# Patient Record
Sex: Female | Born: 1955 | Race: White | Hispanic: No | Marital: Married | Smoking: Former smoker
Health system: Southern US, Community
[De-identification: ages and names within clinical notes are randomized; demographics above are authoritative.]

## PROBLEM LIST (undated history)

## (undated) DIAGNOSIS — N184 Chronic kidney disease, stage 4 (severe): Secondary | ICD-10-CM

## (undated) DIAGNOSIS — I10 Essential (primary) hypertension: Secondary | ICD-10-CM

## (undated) DIAGNOSIS — I5022 Chronic systolic (congestive) heart failure: Secondary | ICD-10-CM

## (undated) DIAGNOSIS — I4892 Unspecified atrial flutter: Secondary | ICD-10-CM

## (undated) DIAGNOSIS — I480 Paroxysmal atrial fibrillation: Secondary | ICD-10-CM

## (undated) DIAGNOSIS — E538 Deficiency of other specified B group vitamins: Secondary | ICD-10-CM

## (undated) DIAGNOSIS — I340 Nonrheumatic mitral (valve) insufficiency: Secondary | ICD-10-CM

## (undated) DIAGNOSIS — I509 Heart failure, unspecified: Secondary | ICD-10-CM

## (undated) DIAGNOSIS — N809 Endometriosis, unspecified: Secondary | ICD-10-CM

## (undated) DIAGNOSIS — J449 Chronic obstructive pulmonary disease, unspecified: Secondary | ICD-10-CM

## (undated) DIAGNOSIS — F32A Depression, unspecified: Secondary | ICD-10-CM

## (undated) DIAGNOSIS — D649 Anemia, unspecified: Secondary | ICD-10-CM

## (undated) DIAGNOSIS — I429 Cardiomyopathy, unspecified: Secondary | ICD-10-CM

## (undated) DIAGNOSIS — Z720 Tobacco use: Secondary | ICD-10-CM

## (undated) DIAGNOSIS — E782 Mixed hyperlipidemia: Secondary | ICD-10-CM

## (undated) DIAGNOSIS — N183 Chronic kidney disease, stage 3 unspecified: Secondary | ICD-10-CM

## (undated) DIAGNOSIS — F329 Major depressive disorder, single episode, unspecified: Secondary | ICD-10-CM

## (undated) HISTORY — DX: Essential (primary) hypertension: I10

## (undated) HISTORY — PX: ABDOMINAL HYSTERECTOMY: SHX81

## (undated) HISTORY — DX: Depression, unspecified: F32.A

## (undated) HISTORY — DX: Endometriosis, unspecified: N80.9

## (undated) HISTORY — DX: Heart failure, unspecified: I50.9

## (undated) HISTORY — DX: Major depressive disorder, single episode, unspecified: F32.9

---

## 1997-08-26 HISTORY — PX: OTHER SURGICAL HISTORY: SHX169

## 2005-07-23 ENCOUNTER — Ambulatory Visit: Payer: Self-pay | Admitting: Internal Medicine

## 2006-03-10 ENCOUNTER — Ambulatory Visit: Payer: Self-pay | Admitting: Surgery

## 2006-04-02 ENCOUNTER — Ambulatory Visit: Payer: Self-pay | Admitting: Surgery

## 2006-05-21 ENCOUNTER — Inpatient Hospital Stay: Payer: Self-pay | Admitting: Orthopedic Surgery

## 2006-05-21 ENCOUNTER — Other Ambulatory Visit: Payer: Self-pay

## 2007-11-23 ENCOUNTER — Ambulatory Visit: Payer: Self-pay | Admitting: Internal Medicine

## 2008-11-30 ENCOUNTER — Ambulatory Visit: Payer: Self-pay | Admitting: Internal Medicine

## 2010-11-09 ENCOUNTER — Ambulatory Visit: Payer: Self-pay | Admitting: Internal Medicine

## 2012-06-05 ENCOUNTER — Other Ambulatory Visit: Payer: Self-pay | Admitting: *Deleted

## 2012-06-05 MED ORDER — AMLODIPINE BESYLATE 10 MG PO TABS: 10.0000 mg | ORAL_TABLET | Freq: Every day | ORAL | Status: DC

## 2012-06-05 NOTE — Telephone Encounter (Signed)
Rx faxed to [facility].

## 2012-07-02 ENCOUNTER — Encounter: Payer: Self-pay | Admitting: *Deleted

## 2012-07-03 ENCOUNTER — Encounter: Payer: Self-pay | Admitting: Internal Medicine

## 2012-07-03 ENCOUNTER — Ambulatory Visit (INDEPENDENT_AMBULATORY_CARE_PROVIDER_SITE_OTHER): Payer: BC Managed Care – PPO | Admitting: Internal Medicine

## 2012-07-03 ENCOUNTER — Encounter: Payer: Self-pay | Admitting: *Deleted

## 2012-07-03 VITALS — BP 144/78 | HR 105 | Temp 98.4°F | Ht 65.0 in | Wt 167.0 lb

## 2012-07-03 DIAGNOSIS — F329 Major depressive disorder, single episode, unspecified: Secondary | ICD-10-CM

## 2012-07-03 DIAGNOSIS — R5381 Other malaise: Secondary | ICD-10-CM

## 2012-07-03 DIAGNOSIS — Z139 Encounter for screening, unspecified: Secondary | ICD-10-CM

## 2012-07-03 DIAGNOSIS — R5383 Other fatigue: Secondary | ICD-10-CM

## 2012-07-03 DIAGNOSIS — I10 Essential (primary) hypertension: Secondary | ICD-10-CM

## 2012-07-03 MED ORDER — HYDROCHLOROTHIAZIDE 25 MG PO TABS: 25.0000 mg | ORAL_TABLET | Freq: Every day | ORAL | Status: DC

## 2012-07-03 MED ORDER — VENLAFAXINE HCL ER 150 MG PO CP24: 150.0000 mg | ORAL_CAPSULE | Freq: Every day | ORAL | Status: DC

## 2012-07-03 NOTE — Patient Instructions (Signed)
It was nice seeing you today.  I am glad you are doing well.  We will get you follow up labs and a physical scheduled.

## 2012-07-05 DIAGNOSIS — F329 Major depressive disorder, single episode, unspecified: Secondary | ICD-10-CM | POA: Insufficient documentation

## 2012-07-05 DIAGNOSIS — F320 Major depressive disorder, single episode, mild: Secondary | ICD-10-CM | POA: Insufficient documentation

## 2012-07-05 DIAGNOSIS — I10 Essential (primary) hypertension: Secondary | ICD-10-CM | POA: Insufficient documentation

## 2012-07-05 NOTE — Progress Notes (Signed)
  Subjective:    Patient ID: Dawn Sherman, female    DOB: [birth year], 56 y.o.   MRN: [id number]  HPI 56 year old female with past history of hypertension and depression who comes in today for a scheduled follow up.  States she is doing relatively well.  Feels she is handling stress well.  Her sister-n-law was recently diagnosed with stage 4 brain cancer.  Good support.  Has not been taking the HCTZ.  Ran out and never got it refilled.  States her blood pressure is averaging 130s/80.  No chest pain or tightness with increased activity or exertion.  Eating and drinking well.    Past Medical History  Diagnosis Date  . Depression   . Hypertension   . Endometriosis     Review of Systems Patient denies any headache, lightheadedness or dizziness.  No significant sinus or allergy symptoms.  No chest pain, tightness or palpitations.  No increased shortness of breath, cough or congestion.  No nausea or vomiting.  No acid reflux.  No abdominal pain or cramping.  No bowel change, such as diarrhea, constipation, BRBPR or melana.  No urine change.        Objective:   Physical Exam Filed Vitals:   07/03/12 0924  BP: 144/78  Pulse: 105  Temp: 98.4 F (36.9 C)   Blood pressure recheck:  57/49  56 year old female in no acute distress.   HEENT:  Nares - clear.  OP- without lesions or erythema.  NECK:  Supple, nontender.  No audible bruit.   HEART:  Appears to be regular. LUNGS:  Without crackles or wheezing audible.  Respirations even and unlabored.   RADIAL PULSE:  Equal bilaterally.  ABDOMEN:  Soft, nontender.  No audible abdominal bruit.   EXTREMITIES:  No increased edema to be present.                     Assessment & Plan:  FATIGUE.  Probably multifactorial.  Check cbc, met c and tsh to confirm no metabolic etiology.   HEALTH MAINTENANCE.  Scheduled a physical - next visit.  Obtain outside records for review - to keep up to date.  She thinks her mammogram was 3/13.  She declines flu shot.

## 2012-07-05 NOTE — Assessment & Plan Note (Signed)
Stable on current regimen.  Same meds.  Follow.

## 2012-07-05 NOTE — Assessment & Plan Note (Signed)
Blood pressure as outlined.  Restart the HCTZ.  Check metabolic panel.  Follow pressures.  If persistent elevation, will require further adjustment.

## 2012-08-30 ENCOUNTER — Telehealth: Payer: Self-pay | Admitting: Internal Medicine

## 2012-08-30 MED ORDER — AMLODIPINE BESYLATE 10 MG PO TABS: 10.0000 mg | ORAL_TABLET | Freq: Every day | ORAL | Status: DC

## 2012-08-30 NOTE — Telephone Encounter (Signed)
rx written for amlodipine 10mg  #30 with 5 refills - sent to [place]

## 2012-09-15 ENCOUNTER — Telehealth: Payer: Self-pay | Admitting: Internal Medicine

## 2012-10-05 ENCOUNTER — Other Ambulatory Visit: Payer: BC Managed Care – PPO

## 2012-10-09 ENCOUNTER — Other Ambulatory Visit: Payer: BC Managed Care – PPO

## 2012-10-12 ENCOUNTER — Encounter: Payer: Self-pay | Admitting: Internal Medicine

## 2012-10-12 ENCOUNTER — Other Ambulatory Visit (HOSPITAL_COMMUNITY)
Admission: RE | Admit: 2012-10-12 | Discharge: 2012-10-12 | Disposition: A | Discharge status: Home / Self Care | Payer: BC Managed Care – PPO | Source: Ambulatory Visit | Attending: Internal Medicine | Admitting: Internal Medicine

## 2012-10-12 ENCOUNTER — Ambulatory Visit (INDEPENDENT_AMBULATORY_CARE_PROVIDER_SITE_OTHER): Payer: BC Managed Care – PPO | Admitting: Internal Medicine

## 2012-10-12 VITALS — BP 140/88 | HR 114 | Temp 98.6°F | Ht 65.0 in | Wt 160.5 lb

## 2012-10-12 DIAGNOSIS — Z1239 Encounter for other screening for malignant neoplasm of breast: Secondary | ICD-10-CM

## 2012-10-12 DIAGNOSIS — Z1151 Encounter for screening for human papillomavirus (HPV): Secondary | ICD-10-CM | POA: Insufficient documentation

## 2012-10-12 DIAGNOSIS — I10 Essential (primary) hypertension: Secondary | ICD-10-CM

## 2012-10-12 DIAGNOSIS — N6489 Other specified disorders of breast: Secondary | ICD-10-CM

## 2012-10-12 DIAGNOSIS — N760 Acute vaginitis: Secondary | ICD-10-CM

## 2012-10-12 DIAGNOSIS — Z01419 Encounter for gynecological examination (general) (routine) without abnormal findings: Secondary | ICD-10-CM | POA: Insufficient documentation

## 2012-10-12 DIAGNOSIS — Z1211 Encounter for screening for malignant neoplasm of colon: Secondary | ICD-10-CM

## 2012-10-12 DIAGNOSIS — F329 Major depressive disorder, single episode, unspecified: Secondary | ICD-10-CM

## 2012-10-13 ENCOUNTER — Encounter: Payer: Self-pay | Admitting: Internal Medicine

## 2012-10-13 LAB — WET PREP BY MOLECULAR PROBE: Gardnerella vaginalis: NEGATIVE

## 2012-10-13 NOTE — Assessment & Plan Note (Signed)
Blood pressure improved on recheck.  Her readings are wnl.  Check metabolic panel.  Same medication regimen.  Follow.

## 2012-10-13 NOTE — Assessment & Plan Note (Signed)
Doing well on her current regimen.  Same medications.  Follow.

## 2012-10-13 NOTE — Progress Notes (Signed)
Subjective:    Patient ID: Dawn Sherman, female    DOB: 03/29/1956, 57 y.o.   MRN: [id number]  HPI 57 year old female with past history of hypertension and depression who comes in today to follow up on these issues as well as for a complete physical exam.  States she is doing relatively well.  Feels she is handling stress well.  She just had two teet pulled - abscess.  Has been on abx.  Now has some vaginal irritation and discharge.  States she fell within the last couple of weeks.  Tripped over a blanket.  Bruised her face and breasts.  Has no residual side effects.  No bruising now.  No pain.  States breathing is stable.  No chest pain or tightness.  Bowels stable.  No blood.  Reports her blood pressure has been doing well on outside checks.  BP averages 120/70s (highest 136/84).    Past Medical History  Diagnosis Date  . Depression   . Hypertension   . Endometriosis     Current Outpatient Prescriptions on File Prior to Visit  Medication Sig Dispense Refill  . amLODipine (NORVASC) 10 MG tablet Take 1 tablet (10 mg total) by mouth daily.  30 tablet  5  . buPROPion (WELLBUTRIN XL) 150 MG 24 hr tablet Take 150 mg by mouth daily.      Marland Kitchen estrogens, conjugated, (PREMARIN) 0.625 MG tablet Take 0.625 mg by mouth daily.       . hydrochlorothiazide (HYDRODIURIL) 25 MG tablet Take 1 tablet (25 mg total) by mouth daily.  30 tablet  5  . venlafaxine XR (EFFEXOR-XR) 150 MG 24 hr capsule Take 1 capsule (150 mg total) by mouth daily.  30 capsule  6   No current facility-administered medications on file prior to visit.    Review of Systems Patient denies any headache, lightheadedness or dizziness.  No sinus or allergy sympotms.   No chest pain, tightness or palpitations.  No increased shortness of breath, cough or congestion.  No nausea or vomiting.  No acid reflux.  No abdominal pain or cramping.  No bowel change, such as diarrhea, constipation, BRBPR or melana.  No urine change.  Does report the  vaginal symptoms as outlined.  Some fatigue.      Objective:   Physical Exam  Filed Vitals:   10/12/12 1605  BP: 140/88  Pulse: 114  Temp: 98.6 F (37 C)   Blood pressure recheck:  31/80  57 year old female in no acute distress.   HEENT:  Nares- clear.  Oropharynx - without lesions. NECK:  Supple.  Nontender.  No audible bruit.  HEART:  Appears to be regular. LUNGS:  No crackles or wheezing audible.  Respirations even and unlabored.  Good breath sounds bilaterally.  RADIAL PULSE:  Equal bilaterally.    BREASTS:  No nipple discharge or nipple retraction present.  Could not appreciate any distinct axillary adenopathy.  She did have some fullness in the left breast - 10-11o'clock region.  Non tender.  No bruising.   ABDOMEN:  Soft, nontender.  Bowel sounds present and normal.  No audible abdominal bruit.  GU:  Normal external genitalia.  Vaginal vault without lesions.  S/P hysterectomy.  Pap performed. Could not appreciate any adnexal masses or tenderness.  Some discharge present.  KOH/wet prep obtained.  RECTAL:  Heme negative.   EXTREMITIES:  No increased edema present.  DP pulses palpable and equal bilaterally.  Assessment & Plan:  FATIGUE.  Probably multifactorial.  Check cbc, met c and tsh to confirm no metabolic etiology.  Never had labs drawn from last visit.    BREAST FULLNESS.  See above exam.  Schedule a diagnostic mammogram.    HEALTH MAINTENANCE.  Physical today.  She thinks her mammogram was 3/13.  Schedule a follow up mammogram as outlined.  She declines flu shot.  Declines colonoscopy.  IFOB given.

## 2012-10-14 ENCOUNTER — Telehealth: Payer: Self-pay | Admitting: Internal Medicine

## 2012-10-14 ENCOUNTER — Other Ambulatory Visit (INDEPENDENT_AMBULATORY_CARE_PROVIDER_SITE_OTHER): Payer: BC Managed Care – PPO

## 2012-10-14 DIAGNOSIS — Z139 Encounter for screening, unspecified: Secondary | ICD-10-CM

## 2012-10-14 DIAGNOSIS — R5381 Other malaise: Secondary | ICD-10-CM

## 2012-10-14 DIAGNOSIS — R5383 Other fatigue: Secondary | ICD-10-CM

## 2012-10-14 LAB — COMPREHENSIVE METABOLIC PANEL
ALT: 22 U/L (ref 0–35)
BUN: 19 mg/dL (ref 6–23)
CO2: 25 mEq/L (ref 19–32)
Creatinine, Ser: 1 mg/dL (ref 0.4–1.2)
GFR: 58.63 mL/min — ABNORMAL LOW (ref >60.00)
Glucose, Bld: 100 mg/dL — ABNORMAL HIGH (ref 70–99)
Total Bilirubin: 0.7 mg/dL (ref 0.3–1.2)

## 2012-10-14 LAB — TSH: TSH: 4.86 u[IU]/mL (ref 0.35–5.50)

## 2012-10-14 LAB — LIPID PANEL
Cholesterol: 182 mg/dL (ref 0–200)
HDL: 94.4 mg/dL (ref >39.00)
Triglycerides: 273 mg/dL — ABNORMAL HIGH (ref 0.0–149.0)

## 2012-10-14 LAB — CBC WITH DIFFERENTIAL/PLATELET
Basophils Absolute: 0.1 10*3/uL (ref 0.0–0.1)
Basophils Relative: 1.4 % (ref 0.0–3.0)
HCT: 39.2 % (ref 36.0–46.0)
Hemoglobin: 13.1 g/dL (ref 12.0–15.0)
Lymphs Abs: 1.7 10*3/uL (ref 0.7–4.0)
Monocytes Relative: 9.8 % (ref 3.0–12.0)
Neutro Abs: 3.9 10*3/uL (ref 1.4–7.7)
RDW: 15.6 % — ABNORMAL HIGH (ref 11.5–14.6)

## 2012-10-14 NOTE — Telephone Encounter (Signed)
Pt was in for labs today and wanted to let you know that she is allergic to clindamycin hcl 300 mg capsule  This causing her break out in a rash

## 2012-10-15 NOTE — Telephone Encounter (Signed)
I added clindamycin to her allergy list.

## 2012-10-16 ENCOUNTER — Encounter: Payer: Self-pay | Admitting: Internal Medicine

## 2012-10-20 ENCOUNTER — Other Ambulatory Visit: Payer: Self-pay | Admitting: *Deleted

## 2012-10-20 ENCOUNTER — Ambulatory Visit: Payer: Self-pay | Admitting: Internal Medicine

## 2012-10-20 MED ORDER — ESTROGENS CONJUGATED 0.625 MG PO TABS: 0.6250 mg | ORAL_TABLET | Freq: Every day | ORAL | Status: DC

## 2012-10-21 ENCOUNTER — Telehealth: Payer: Self-pay | Admitting: Internal Medicine

## 2012-10-21 DIAGNOSIS — R928 Other abnormal and inconclusive findings on diagnostic imaging of breast: Secondary | ICD-10-CM

## 2012-10-21 NOTE — Telephone Encounter (Signed)
Pt notified of category 4 mammo.  Order placed for referral to Dr Bary Castilla

## 2012-10-23 ENCOUNTER — Encounter: Payer: Self-pay | Admitting: *Deleted

## 2012-11-03 ENCOUNTER — Encounter: Payer: Self-pay | Admitting: *Deleted

## 2012-11-05 ENCOUNTER — Encounter: Payer: Self-pay | Admitting: Internal Medicine

## 2012-11-06 ENCOUNTER — Encounter: Payer: Self-pay | Admitting: *Deleted

## 2012-11-09 ENCOUNTER — Telehealth: Payer: Self-pay | Admitting: *Deleted

## 2012-11-09 NOTE — Telephone Encounter (Signed)
Pt is coming in for labs tomorrow 03.18.2014, is it just for an IFOB? Thank yo u

## 2012-11-10 ENCOUNTER — Other Ambulatory Visit: Payer: BC Managed Care – PPO

## 2012-11-10 ENCOUNTER — Telehealth: Payer: Self-pay | Admitting: *Deleted

## 2012-11-10 NOTE — Telephone Encounter (Signed)
Pt is coming in tomorrow 03.19.2014 for labs. Is it just for a urine sample? Thank you

## 2012-11-10 NOTE — Telephone Encounter (Signed)
Wrong patient

## 2012-11-10 NOTE — Telephone Encounter (Signed)
She does need to IFOB if she does not have.  Also can repeat alkaline phosphatase (dx elevated alkaline phosphatase).  Thanks

## 2012-11-12 ENCOUNTER — Ambulatory Visit: Payer: Self-pay | Admitting: General Surgery

## 2012-11-24 ENCOUNTER — Other Ambulatory Visit: Payer: BC Managed Care – PPO

## 2012-11-26 ENCOUNTER — Ambulatory Visit: Payer: Self-pay | Admitting: General Surgery

## 2012-12-14 ENCOUNTER — Ambulatory Visit (INDEPENDENT_AMBULATORY_CARE_PROVIDER_SITE_OTHER): Payer: BC Managed Care – PPO | Admitting: General Surgery

## 2012-12-14 ENCOUNTER — Other Ambulatory Visit: Payer: Self-pay

## 2012-12-14 ENCOUNTER — Encounter: Payer: Self-pay | Admitting: General Surgery

## 2012-12-14 ENCOUNTER — Other Ambulatory Visit: Payer: Self-pay | Admitting: *Deleted

## 2012-12-14 ENCOUNTER — Encounter: Payer: Self-pay | Admitting: *Deleted

## 2012-12-14 VITALS — BP 142/80 | HR 96 | Resp 16 | Ht 65.0 in | Wt 159.0 lb

## 2012-12-14 DIAGNOSIS — N6489 Other specified disorders of breast: Secondary | ICD-10-CM

## 2012-12-14 DIAGNOSIS — N63 Unspecified lump in unspecified breast: Secondary | ICD-10-CM

## 2012-12-14 NOTE — Progress Notes (Signed)
Patient ID: [name], female   DOB: [birth year], 57 y.o.   MRN: [id number]  Chief Complaint  Patient presents with  . New Breast Patient    category 4 mammogram    HPI Dawn Sherman is a 57 y.o. female who presents for an abnormal mammogram. The most recent mammogram was done 10/20/12 at [facility] with birad category 4. Left breast ultrasound was performed at that time. The patient states she had a fall approximately 5 months ago which she had bruised both breasts at that time. She says that she had swelling in both breasts and was not able to wear a bra for awhile. She hasn't nipple discharge or lumps. Prior to her fall she was not having any problems with her breasts. She has had a breast biopsy of the right breast at [facility] approximately 20 years ago which was benign. No known family history of breast cancer. She does perform regular self breast checks as well as gets regular mammograms done.  HPI  Past Medical History  Diagnosis Date  . Depression   . Hypertension   . Endometriosis     Past Surgical History  Procedure Laterality Date  . Tah/rso  1999    secondary to bleeding and endometriosis (Dr Vernie Ammons)  . Abdominal hysterectomy      Family History  Problem Relation Age of Onset  . Hypercholesterolemia Mother   . Breast cancer Neg Hx   . Colon cancer Neg Hx     Social History History  Substance Use Topics  . Smoking status: Current Every Day Smoker -- 1.00 packs/day for 20 years    Types: Cigarettes  . Smokeless tobacco: Never Used  . Alcohol Use: Yes     Comment: occasional    Allergies  Allergen Reactions  . Amoxicillin   . Benicar (Olmesartan)   . Codeine Sulfate   . Morphine And Related   . Penicillins   . Sulfate   . Clindamycin/Lincomycin Rash    Current Outpatient Prescriptions  Medication Sig Dispense Refill  . amLODipine (NORVASC) 10 MG tablet Take 1 tablet (10 mg total) by mouth daily.  30 tablet  5  . buPROPion (WELLBUTRIN XL) 150 MG 24 hr tablet Take  150 mg by mouth daily.      . cetirizine (ZYRTEC) 10 MG tablet Take 10 mg by mouth daily.      Marland Kitchen estrogens, conjugated, (PREMARIN) 0.625 MG tablet Take 1 tablet (0.625 mg total) by mouth daily.  30 tablet  5  . hydrochlorothiazide (HYDRODIURIL) 25 MG tablet Take 1 tablet (25 mg total) by mouth daily.  30 tablet  5  . venlafaxine XR (EFFEXOR-XR) 150 MG 24 hr capsule Take 1 capsule (150 mg total) by mouth daily.  30 capsule  6   No current facility-administered medications for this visit.    Review of Systems Review of Systems  Constitutional: Negative.   Respiratory: Negative.   Cardiovascular: Negative.     Blood pressure 142/80, pulse 96, resp. rate 16, height 5\' 5"  (1.651 m), weight 159 lb (72.122 kg).  Physical Exam Physical Exam  Constitutional: She appears well-developed and well-nourished.  Neck: Trachea normal. No mass and no thyromegaly present.  Cardiovascular: Normal rate, regular rhythm, normal heart sounds and normal pulses.   No murmur heard. Pulmonary/Chest: Effort normal and breath sounds normal. Right breast exhibits no inverted nipple, no mass, no nipple discharge, no skin change and no tenderness. Left breast exhibits tenderness. Left breast exhibits no inverted nipple, no mass, no  nipple discharge and no skin change. Breasts are symmetrical.  Fullness from 9-10 o'clock mild tenderness of left breast.   Lymphadenopathy:    She has no cervical adenopathy.    She has no axillary adenopathy.    Data Reviewed Bilateral mammogram dated October 20, 2012 showed extremely dense breast tissue. No areas of concern identified.  Focal spot compression views in the area of reported fullness in the superior medial aspect of the breast suggested a small round asymmetry similar to previous studies. A large area of architectural distortion density was seen in the superior aspect of the left breast of the 12th 1:00 position. Lucency suggestive of fat necrosis was  seen.  Ultrasound examination at the 10:00 position reported hypoechoic oval mass measuring 0.7 x 0.8 x 1.0 cm with posterior acoustic shadowing. At 10:30 o'clock a small hypoechoic mass measuring 6 mm in greatest diameter was noted. And in the 11 to 2:00 position multiple hypoechoic to anechoic lesions thought to represent a possible fluid collection were identified. At the 1:30 o'clock position 2 small relatively superficial hypoechoic masses measuring less than 0.5 cm diameter were identified. Axillary ultrasound was unremarkable. BI-RAD-4.  Ultrasound examination of the left breast was completed. The 10:00 position 5 cm from the nipple a 0.56 x 0.9 x 1.3 cm irregular hypoechoic mass with posterior acoustic shadowing matching that described in the hospital ultrasound was identified. The patient was amenable to vacuum-assisted biopsy. 10 cc of 0.5% Xylocaine with 0.25% Marcaine with one 200,000 units of epinephrine was utilized well-tolerated. Under ultrasound guidance the 14-gauge Anastomosis was passed through the lesion and multiple core samples obtained. These were taken through multiple levels of the mass. Postbiopsy clip placement was undertaken. The patient tolerated the procedure well. Scant bleeding was noted. The skin defect was closed with benzoin and Steri-Strips followed by Telfa and Tegaderm dressing. Assessment    Likely traumatic injury to left breast.     Plan    The patient will be contacted once pathology is available. She will return in one week for evaluation with the nurse. Physician follow up will take place based on results of the biopsy.        Robert Bellow 12/15/2012, 9:28 PM

## 2012-12-14 NOTE — Progress Notes (Signed)
The patient has been asked to return to the office in 4 months for a unilateral left breast diagnostic mammogram with [facility] views at [facility].

## 2012-12-15 ENCOUNTER — Telehealth: Payer: Self-pay | Admitting: General Surgery

## 2012-12-15 ENCOUNTER — Encounter: Payer: Self-pay | Admitting: General Surgery

## 2012-12-15 DIAGNOSIS — N63 Unspecified lump in unspecified breast: Secondary | ICD-10-CM | POA: Insufficient documentation

## 2012-12-15 LAB — PATHOLOGY

## 2012-12-15 NOTE — Telephone Encounter (Signed)
Patient notified that her pathology on the left breast mass was benign. She reports minimal discomfort from the procedure. She will follow up next week as scheduled with the nurse for a wound check.

## 2012-12-23 ENCOUNTER — Ambulatory Visit: Payer: BC Managed Care – PPO

## 2013-01-05 ENCOUNTER — Encounter: Payer: Self-pay | Admitting: Internal Medicine

## 2013-01-05 ENCOUNTER — Ambulatory Visit (INDEPENDENT_AMBULATORY_CARE_PROVIDER_SITE_OTHER): Payer: BC Managed Care – PPO | Admitting: *Deleted

## 2013-01-05 DIAGNOSIS — N63 Unspecified lump in unspecified breast: Secondary | ICD-10-CM

## 2013-01-05 NOTE — Progress Notes (Signed)
Patient here today for follow up post left breast biopsy.  No bruising noted.  Area clean and healed.  The patient is aware that a heating pad may be used for comfort as needed.  Aware of pathology. Follow up as scheduled.

## 2013-01-05 NOTE — Patient Instructions (Signed)
The patient is aware that a heating pad may be used for comfort as needed.  Aware of pathology. Follow up as scheduled in August

## 2013-01-20 ENCOUNTER — Telehealth: Payer: Self-pay | Admitting: *Deleted

## 2013-01-20 MED ORDER — BUPROPION HCL ER (XL) 150 MG PO TB24: 150.0000 mg | ORAL_TABLET | Freq: Every day | ORAL | Status: DC

## 2013-01-20 NOTE — Telephone Encounter (Signed)
Refilled wellbutrin #30 with 5 refills

## 2013-01-20 NOTE — Telephone Encounter (Signed)
Wellbutrin refill please advise. Last OV 10/21/12

## 2013-01-26 ENCOUNTER — Other Ambulatory Visit: Payer: Self-pay | Admitting: Internal Medicine

## 2013-02-11 ENCOUNTER — Ambulatory Visit: Payer: BC Managed Care – PPO | Admitting: Internal Medicine

## 2013-03-02 ENCOUNTER — Ambulatory Visit: Payer: BC Managed Care – PPO | Admitting: Internal Medicine

## 2013-03-08 ENCOUNTER — Ambulatory Visit (INDEPENDENT_AMBULATORY_CARE_PROVIDER_SITE_OTHER): Payer: BC Managed Care – PPO | Admitting: Internal Medicine

## 2013-03-08 ENCOUNTER — Encounter: Payer: Self-pay | Admitting: Internal Medicine

## 2013-03-08 VITALS — BP 130/90 | HR 104 | Temp 99.0°F | Ht 65.0 in | Wt 158.0 lb

## 2013-03-08 DIAGNOSIS — I10 Essential (primary) hypertension: Secondary | ICD-10-CM

## 2013-03-08 DIAGNOSIS — N63 Unspecified lump in unspecified breast: Secondary | ICD-10-CM

## 2013-03-08 DIAGNOSIS — R238 Other skin changes: Secondary | ICD-10-CM

## 2013-03-08 DIAGNOSIS — F329 Major depressive disorder, single episode, unspecified: Secondary | ICD-10-CM

## 2013-03-08 LAB — CBC WITH DIFFERENTIAL/PLATELET
Basophils Relative: 0.6 % (ref 0.0–3.0)
Eosinophils Relative: 1 % (ref 0.0–5.0)
HCT: 42.5 % (ref 36.0–46.0)
Hemoglobin: 14.6 g/dL (ref 12.0–15.0)
MCV: 103.9 fl — ABNORMAL HIGH (ref 78.0–100.0)
Monocytes Absolute: 0.8 10*3/uL (ref 0.1–1.0)
Neutro Abs: 6.2 10*3/uL (ref 1.4–7.7)
Neutrophils Relative %: 73.6 % (ref 43.0–77.0)
RBC: 4.09 Mil/uL (ref 3.87–5.11)
WBC: 8.4 10*3/uL (ref 4.5–10.5)

## 2013-03-08 LAB — BASIC METABOLIC PANEL
BUN: 24 mg/dL — ABNORMAL HIGH (ref 6–23)
Creatinine, Ser: 1.2 mg/dL (ref 0.4–1.2)
GFR: 47.26 mL/min — ABNORMAL LOW (ref >60.00)
Glucose, Bld: 111 mg/dL — ABNORMAL HIGH (ref 70–99)
Potassium: 4.5 mEq/L (ref 3.5–5.1)

## 2013-03-08 LAB — PROTIME-INR: INR: 1 ratio (ref 0.8–1.0)

## 2013-03-09 ENCOUNTER — Other Ambulatory Visit: Payer: Self-pay | Admitting: Internal Medicine

## 2013-03-09 DIAGNOSIS — N289 Disorder of kidney and ureter, unspecified: Secondary | ICD-10-CM

## 2013-03-09 NOTE — Progress Notes (Signed)
Order placed for f/u urinalysis and met b

## 2013-03-10 ENCOUNTER — Encounter: Payer: Self-pay | Admitting: Internal Medicine

## 2013-03-10 NOTE — Assessment & Plan Note (Signed)
Saw Dr Bary Castilla.  Biopsy negative.  Has f/u planned in 8/14.

## 2013-03-10 NOTE — Assessment & Plan Note (Signed)
Blood pressure improved on recheck.  Her readings are wnl.  Check metabolic panel.  Same medication regimen.  Follow.

## 2013-03-10 NOTE — Progress Notes (Signed)
  Subjective:    Patient ID: Dawn Sherman, female    DOB: 02-Jan-1956, 57 y.o.   MRN: [id number]  HPI 57 year old female with past history of hypertension and depression who comes in today for a scheduled follow up.  States she is doing relatively well.  Feels she is handling stress well.   States breathing is stable.  No chest pain or tightness.  Bowels stable.  No blood.  Reports her blood pressure has been doing well on outside checks.  BP averages 130/85.  Noticed some increased bruising - forearms.     Past Medical History  Diagnosis Date  . Depression   . Hypertension   . Endometriosis     Current Outpatient Prescriptions on File Prior to Visit  Medication Sig Dispense Refill  . amLODipine (NORVASC) 10 MG tablet Take 1 tablet (10 mg total) by mouth daily.  30 tablet  5  . buPROPion (WELLBUTRIN XL) 150 MG 24 hr tablet Take 1 tablet (150 mg total) by mouth daily.  30 tablet  5  . cetirizine (ZYRTEC) 10 MG tablet Take 10 mg by mouth daily.      Marland Kitchen estrogens, conjugated, (PREMARIN) 0.625 MG tablet Take 1 tablet (0.625 mg total) by mouth daily.  30 tablet  5  . hydrochlorothiazide (HYDRODIURIL) 25 MG tablet TAKE 1 TABLET BY MOUTH EVERY DAY  30 tablet  5  . venlafaxine XR (EFFEXOR-XR) 150 MG 24 hr capsule TAKE ONE CAPSULE BY MOUTH EVERY DAY  30 capsule  5   No current facility-administered medications on file prior to visit.    Review of Systems Patient denies any headache, lightheadedness or dizziness.  No sinus or allergy sympotms.   No chest pain, tightness or palpitations.  No increased shortness of breath, cough or congestion.  No nausea or vomiting.  No acid reflux.  No abdominal pain or cramping.  No bowel change, such as diarrhea, constipation, BRBPR or melana.  No urine change.  Bruising as outlined.      Objective:   Physical Exam  Filed Vitals:   03/08/13 0932  BP: 130/90  Pulse: 104  Temp: 99 F (37.2 C)   Blood pressure recheck:  132/84, pulse 42  57 year old  female in no acute distress.   HEENT:  Nares- clear.  Oropharynx - without lesions. NECK:  Supple.  Nontender.  No audible bruit.  HEART:  Appears to be regular. LUNGS:  No crackles or wheezing audible.  Respirations even and unlabored.  Good breath sounds bilaterally.  RADIAL PULSE:  Equal bilaterally.    ABDOMEN:  Soft, nontender.  Bowel sounds present and normal.  No audible abdominal bruit.  EXTREMITIES:  No increased edema present.  DP pulses palpable and equal bilaterally.  DERM:  Increased bruising - bilateral forearms.  No other bruising.  Thin skin.           Assessment & Plan:  ABNORMAL MAMMOGRAM.   Saw Dr Bary Castilla.  Had negative biopsy.  Due to see Dr Bary Castilla in 8/14.   EASY BRUISING.  Check cbc, PT/PTT.  Further w/up pending results.  Denies taking aspirin or antiinflammatories.    HEALTH MAINTENANCE.  Physical 10/12/12.   Declines colonoscopy.  Mammogram - as above.

## 2013-03-10 NOTE — Assessment & Plan Note (Signed)
Doing well on her current regimen.  Same medications.  Follow.

## 2013-03-19 ENCOUNTER — Other Ambulatory Visit: Payer: BC Managed Care – PPO

## 2013-03-26 ENCOUNTER — Other Ambulatory Visit (INDEPENDENT_AMBULATORY_CARE_PROVIDER_SITE_OTHER): Payer: BC Managed Care – PPO

## 2013-03-26 ENCOUNTER — Other Ambulatory Visit: Payer: Self-pay | Admitting: *Deleted

## 2013-03-26 DIAGNOSIS — N289 Disorder of kidney and ureter, unspecified: Secondary | ICD-10-CM

## 2013-03-26 LAB — URINALYSIS, ROUTINE W REFLEX MICROSCOPIC
Bilirubin Urine: NEGATIVE
Ketones, ur: NEGATIVE
RBC / HPF: NONE SEEN (ref 0–?)
Specific Gravity, Urine: 1.02 (ref 1.000–1.030)
Urine Glucose: NEGATIVE
Urobilinogen, UA: 0.2 (ref 0.0–1.0)

## 2013-03-26 LAB — BASIC METABOLIC PANEL
BUN: 22 mg/dL (ref 6–23)
CO2: 28 mEq/L (ref 19–32)
Chloride: 95 mEq/L — ABNORMAL LOW (ref 96–112)
Potassium: 3.9 mEq/L (ref 3.5–5.1)

## 2013-03-26 MED ORDER — AMLODIPINE BESYLATE 10 MG PO TABS: 10.0000 mg | ORAL_TABLET | Freq: Every day | ORAL | Status: DC

## 2013-04-01 ENCOUNTER — Other Ambulatory Visit: Payer: Self-pay | Admitting: Internal Medicine

## 2013-04-01 NOTE — Telephone Encounter (Signed)
Refill completed 03/26/13, confirmed receipt by pharmacy.

## 2013-04-07 ENCOUNTER — Encounter: Payer: Self-pay | Admitting: General Surgery

## 2013-04-07 ENCOUNTER — Ambulatory Visit: Payer: Self-pay | Admitting: General Surgery

## 2013-04-13 ENCOUNTER — Other Ambulatory Visit: Payer: Self-pay | Admitting: Internal Medicine

## 2013-04-15 ENCOUNTER — Encounter: Payer: Self-pay | Admitting: General Surgery

## 2013-04-15 ENCOUNTER — Other Ambulatory Visit: Payer: Self-pay

## 2013-04-15 ENCOUNTER — Ambulatory Visit (INDEPENDENT_AMBULATORY_CARE_PROVIDER_SITE_OTHER): Payer: BC Managed Care – PPO | Admitting: General Surgery

## 2013-04-15 VITALS — BP 130/80 | HR 82 | Resp 12 | Ht 65.0 in | Wt 158.0 lb

## 2013-04-15 DIAGNOSIS — N63 Unspecified lump in unspecified breast: Secondary | ICD-10-CM

## 2013-04-15 NOTE — Progress Notes (Signed)
Patient ID: Dawn Sherman, female   DOB: 04/12/1956, 57 y.o.   MRN: [id number]  Chief Complaint  Patient presents with  . Follow-up    mammogram    HPI Dawn Sherman is a 57 y.o. female who presents for a breast evaluation. The most recent  Left breast mammogram was done on 04/07/13.Patient does perform regular self breast checks and gets regular mammograms done.    HPI  Past Medical History  Diagnosis Date  . Depression   . Hypertension   . Endometriosis     Past Surgical History  Procedure Laterality Date  . Tah/rso  1999    secondary to bleeding and endometriosis (Dr Vernie Ammons)  . Abdominal hysterectomy      Family History  Problem Relation Age of Onset  . Hypercholesterolemia Mother   . Breast cancer Neg Hx   . Colon cancer Neg Hx     Social History History  Substance Use Topics  . Smoking status: Current Every Day Smoker -- 1.00 packs/day for 20 years    Types: Cigarettes  . Smokeless tobacco: Never Used  . Alcohol Use: Yes     Comment: occasional    Allergies  Allergen Reactions  . Amoxicillin   . Benicar [Olmesartan]   . Codeine Sulfate   . Morphine And Related   . Penicillins   . Sulfate   . Clindamycin/Lincomycin Rash    Current Outpatient Prescriptions  Medication Sig Dispense Refill  . amLODipine (NORVASC) 10 MG tablet Take 1 tablet (10 mg total) by mouth daily.  30 tablet  5  . buPROPion (WELLBUTRIN XL) 150 MG 24 hr tablet Take 1 tablet (150 mg total) by mouth daily.  30 tablet  5  . cetirizine (ZYRTEC) 10 MG tablet TAKE 1 TABLET BY MOUTH EVERY MORNING  30 tablet  5  . estrogens, conjugated, (PREMARIN) 0.625 MG tablet Take 1 tablet (0.625 mg total) by mouth daily.  30 tablet  5  . hydrochlorothiazide (HYDRODIURIL) 25 MG tablet TAKE 1 TABLET BY MOUTH EVERY DAY  30 tablet  5  . venlafaxine XR (EFFEXOR-XR) 150 MG 24 hr capsule TAKE ONE CAPSULE BY MOUTH EVERY DAY  30 capsule  5   No current facility-administered medications for this visit.    Review  of Systems Review of Systems  Constitutional: Negative.   Respiratory: Negative.   Cardiovascular: Negative.     Blood pressure 130/80, pulse 82, resp. rate 12, height 5\' 5"  (1.651 m), weight 158 lb (71.668 kg).  Physical Exam Physical Exam  Constitutional: She is oriented to person, place, and time. She appears well-developed and well-nourished.  Eyes: No scleral icterus.  Cardiovascular: Normal rate, regular rhythm and normal heart sounds.   Pulmonary/Chest: Breath sounds normal. Right breast exhibits no inverted nipple, no mass, no nipple discharge, no skin change and no tenderness. Left breast exhibits no inverted nipple, no mass, no nipple discharge, no skin change and no tenderness.  Fullness in the left breast upper inner quadrant  Lymphadenopathy:    She has no cervical adenopathy.    She has no axillary adenopathy.  Neurological: She is alert and oriented to person, place, and time.  Skin: Skin is dry.    Data Reviewed Left breast mammogram dated April 07, 2013 shows slight decrease in nodularity of left breast. BI-RAD-3.  Biopsy dated October 19, 2012 of the left breast for calcification showed columnar cell changes with apocrine metaplasia. No evidence of atypia or malignancy.  Focused ultrasound examination of the left breast  in the 10:00 position 5 cm from the nipple shows a 0.4 x 0.71 x 1.2  centimeter softly lobulated hypoechoic nodule with minimal acoustic shadowing.  Assessment    Benign breast exam.     Plan    Will plan for one final followup examination in 6 month with bilateral mammograms at that time.        Robert Bellow 04/16/2013, 9:01 PM

## 2013-04-15 NOTE — Patient Instructions (Signed)
Patient to return in six months  

## 2013-04-16 ENCOUNTER — Encounter: Payer: Self-pay | Admitting: General Surgery

## 2013-04-24 ENCOUNTER — Other Ambulatory Visit: Payer: Self-pay | Admitting: Internal Medicine

## 2013-06-08 ENCOUNTER — Encounter: Payer: Self-pay | Admitting: Internal Medicine

## 2013-06-09 ENCOUNTER — Encounter: Payer: Self-pay | Admitting: Internal Medicine

## 2013-07-27 ENCOUNTER — Other Ambulatory Visit: Payer: Self-pay | Admitting: Internal Medicine

## 2013-08-01 ENCOUNTER — Other Ambulatory Visit: Payer: Self-pay | Admitting: Internal Medicine

## 2013-08-02 ENCOUNTER — Other Ambulatory Visit: Payer: Self-pay | Admitting: Internal Medicine

## 2013-08-26 HISTORY — PX: BREAST BIOPSY: SHX20

## 2013-09-30 ENCOUNTER — Other Ambulatory Visit: Payer: Self-pay | Admitting: Internal Medicine

## 2013-10-05 ENCOUNTER — Other Ambulatory Visit: Payer: Self-pay | Admitting: Internal Medicine

## 2013-10-14 ENCOUNTER — Encounter: Payer: BC Managed Care – PPO | Admitting: Internal Medicine

## 2013-10-21 ENCOUNTER — Ambulatory Visit: Payer: BC Managed Care – PPO | Admitting: General Surgery

## 2013-10-27 ENCOUNTER — Other Ambulatory Visit: Payer: Self-pay | Admitting: Internal Medicine

## 2013-10-27 NOTE — Telephone Encounter (Signed)
Appt 12/13/13

## 2013-10-28 ENCOUNTER — Other Ambulatory Visit: Payer: Self-pay | Admitting: Internal Medicine

## 2013-10-29 ENCOUNTER — Other Ambulatory Visit: Payer: Self-pay | Admitting: Internal Medicine

## 2013-11-03 ENCOUNTER — Ambulatory Visit: Payer: BC Managed Care – PPO | Admitting: General Surgery

## 2013-11-17 ENCOUNTER — Ambulatory Visit: Payer: Self-pay | Admitting: General Surgery

## 2013-11-18 ENCOUNTER — Ambulatory Visit: Payer: Self-pay | Admitting: General Surgery

## 2013-11-19 ENCOUNTER — Encounter: Payer: Self-pay | Admitting: General Surgery

## 2013-11-25 ENCOUNTER — Ambulatory Visit: Payer: Self-pay | Admitting: General Surgery

## 2013-12-13 ENCOUNTER — Encounter: Payer: Self-pay | Admitting: Internal Medicine

## 2013-12-13 ENCOUNTER — Encounter (INDEPENDENT_AMBULATORY_CARE_PROVIDER_SITE_OTHER): Payer: Self-pay

## 2013-12-13 ENCOUNTER — Ambulatory Visit (INDEPENDENT_AMBULATORY_CARE_PROVIDER_SITE_OTHER): Payer: BC Managed Care – PPO | Admitting: Internal Medicine

## 2013-12-13 VITALS — BP 138/90 | HR 105 | Temp 98.1°F | Ht 65.0 in | Wt 167.5 lb

## 2013-12-13 DIAGNOSIS — I10 Essential (primary) hypertension: Secondary | ICD-10-CM

## 2013-12-13 DIAGNOSIS — E7800 Pure hypercholesterolemia, unspecified: Secondary | ICD-10-CM

## 2013-12-13 DIAGNOSIS — F32A Depression, unspecified: Secondary | ICD-10-CM

## 2013-12-13 DIAGNOSIS — R233 Spontaneous ecchymoses: Secondary | ICD-10-CM

## 2013-12-13 DIAGNOSIS — F329 Major depressive disorder, single episode, unspecified: Secondary | ICD-10-CM

## 2013-12-13 DIAGNOSIS — F3289 Other specified depressive episodes: Secondary | ICD-10-CM

## 2013-12-13 DIAGNOSIS — N63 Unspecified lump in unspecified breast: Secondary | ICD-10-CM

## 2013-12-13 DIAGNOSIS — R238 Other skin changes: Secondary | ICD-10-CM

## 2013-12-13 LAB — COMPREHENSIVE METABOLIC PANEL
ALT: 36 U/L — ABNORMAL HIGH (ref 0–35)
AST: 43 U/L — AB (ref 0–37)
Albumin: 4.1 g/dL (ref 3.5–5.2)
Alkaline Phosphatase: 155 U/L — ABNORMAL HIGH (ref 39–117)
BILIRUBIN TOTAL: 0.4 mg/dL (ref 0.3–1.2)
BUN: 29 mg/dL — AB (ref 6–23)
CALCIUM: 9.7 mg/dL (ref 8.4–10.5)
CHLORIDE: 97 meq/L (ref 96–112)
CO2: 27 mEq/L (ref 19–32)
Creatinine, Ser: 0.9 mg/dL (ref 0.4–1.2)
GFR: 64.89 mL/min (ref >60.00)
Glucose, Bld: 90 mg/dL (ref 70–99)
Potassium: 4.3 mEq/L (ref 3.5–5.1)
Sodium: 136 mEq/L (ref 135–145)
Total Protein: 7.6 g/dL (ref 6.0–8.3)

## 2013-12-13 LAB — CBC WITH DIFFERENTIAL/PLATELET
BASOS ABS: 0.1 10*3/uL (ref 0.0–0.1)
Basophils Relative: 0.6 % (ref 0.0–3.0)
Eosinophils Absolute: 0.1 10*3/uL (ref 0.0–0.7)
Eosinophils Relative: 1.1 % (ref 0.0–5.0)
HEMATOCRIT: 38.4 % (ref 36.0–46.0)
HEMOGLOBIN: 12.9 g/dL (ref 12.0–15.0)
LYMPHS PCT: 19.6 % (ref 12.0–46.0)
Lymphs Abs: 2 10*3/uL (ref 0.7–4.0)
MCHC: 33.6 g/dL (ref 30.0–36.0)
MCV: 100.5 fl — AB (ref 78.0–100.0)
MONO ABS: 0.9 10*3/uL (ref 0.1–1.0)
Monocytes Relative: 9.2 % (ref 3.0–12.0)
NEUTROS ABS: 6.9 10*3/uL (ref 1.4–7.7)
Neutrophils Relative %: 69.5 % (ref 43.0–77.0)
Platelets: 260 10*3/uL (ref 150.0–400.0)
RBC: 3.82 Mil/uL — AB (ref 3.87–5.11)
RDW: 15.3 % — AB (ref 11.5–14.6)
WBC: 10 10*3/uL (ref 4.5–10.5)

## 2013-12-13 LAB — TSH: TSH: 4.08 u[IU]/mL (ref 0.35–5.50)

## 2013-12-13 LAB — LIPID PANEL
Cholesterol: 247 mg/dL — ABNORMAL HIGH (ref 0–200)
HDL: 120.3 mg/dL (ref >39.00)
LDL CALC: 103 mg/dL — AB (ref 0–99)
TRIGLYCERIDES: 121 mg/dL (ref 0.0–149.0)
Total CHOL/HDL Ratio: 2
VLDL: 24.2 mg/dL (ref 0.0–40.0)

## 2013-12-13 LAB — PROTIME-INR
INR: 1 ratio (ref 0.8–1.0)
Prothrombin Time: 10.8 s (ref 9.6–13.1)

## 2013-12-13 LAB — APTT: aPTT: 27.1 s (ref 21.7–28.8)

## 2013-12-13 NOTE — Progress Notes (Signed)
Pre visit review using our clinic review tool, if applicable. No additional management support is needed unless otherwise documented below in the visit note. 

## 2013-12-13 NOTE — Progress Notes (Signed)
Subjective:    Patient ID: Dawn Sherman, female    DOB: [birth year], 58 y.o.   MRN: [id number]  HPI 58 year old female with past history of hypertension and depression who comes in today to follow up on these issues as well as for a complete physical exam.   States she is doing relatively well.  Feels she is handling stress well.   States breathing is stable.  No chest pain or tightness.  Bowels stable.  No blood.  Reports her blood pressure has been doing well on outside checks.  BP averages 130/85.  Noticed some increased bruising - forearms.  Continuing to smoke.  Discussed the need to quit.     Past Medical History  Diagnosis Date  . Depression   . Hypertension   . Endometriosis     Current Outpatient Prescriptions on File Prior to Visit  Medication Sig Dispense Refill  . amLODipine (NORVASC) 10 MG tablet TAKE 1 TABLET (10 MG TOTAL) BY MOUTH DAILY.  30 tablet  5  . buPROPion (WELLBUTRIN XL) 150 MG 24 hr tablet TAKE 1 TABLET BY MOUTH EVERY DAY  30 tablet  1  . cetirizine (ZYRTEC) 10 MG tablet TAKE 1 TABLET BY MOUTH EVERY MORNING  30 tablet  5  . hydrochlorothiazide (HYDRODIURIL) 25 MG tablet TAKE 1 TABLET BY MOUTH EVERY DAY  30 tablet  5  . PREMARIN 0.625 MG tablet TAKE 1 TABLET BY MOUTH EVERY DAY  30 tablet  1  . venlafaxine XR (EFFEXOR-XR) 150 MG 24 hr capsule TAKE ONE CAPSULE BY MOUTH EVERY DAY  30 capsule  1   No current facility-administered medications on file prior to visit.    Review of Systems Patient denies any headache, lightheadedness or dizziness.  No sinus or allergy sympotms.   No chest pain, tightness or palpitations.  No increased shortness of breath, cough or congestion.  No nausea or vomiting.  No acid reflux.  No abdominal pain or cramping.  No bowel change, such as diarrhea, constipation, BRBPR or melana.  No urine change.  Bruising as outlined.      Objective:   Physical Exam  Filed Vitals:   12/13/13 1103  BP: 138/90  Pulse: 105  Temp: 98.1 F (36.7 C)    Blood pressure recheck:  136/84, pulse 34-55  58 year old female in no acute distress.   HEENT:  Nares- clear.  Oropharynx - without lesions. NECK:  Supple.  Nontender.  No audible bruit.  HEART:  Appears to be regular. LUNGS:  No crackles or wheezing audible.  Respirations even and unlabored.  RADIAL PULSE:  Equal bilaterally.    BREASTS:  No nipple discharge or nipple retraction present.  Could not appreciate any distinct nodules or axillary adenopathy.  ABDOMEN:  Soft, nontender.  Bowel sounds present and normal.  No audible abdominal bruit.  GU:  Not performed.     EXTREMITIES:  No increased edema present.  DP pulses palpable and equal bilaterally.             Assessment & Plan:  ABNORMAL MAMMOGRAM.   Saw Dr Bary Castilla.  Had negative biopsy.  Had mammogram 11/18/13 - Birads I.  Has a f/u scheduled on Thurday (12/16/13) - with Dr Bary Castilla.     EASY BRUISING.  Check cbc, PT/PTT.  Further w/up pending results.  Denies taking aspirin or antiinflammatories.    HEALTH MAINTENANCE.  Physical today.   Declines colonoscopy.  Discussed again with her today.  She plans to discuss  with Dr Bary Castilla (for him to perform).   Mammogram - as above.  PAP 10/12/12 negative with negative HPV.

## 2013-12-14 ENCOUNTER — Other Ambulatory Visit: Payer: Self-pay | Admitting: Internal Medicine

## 2013-12-14 DIAGNOSIS — R7989 Other specified abnormal findings of blood chemistry: Secondary | ICD-10-CM

## 2013-12-14 DIAGNOSIS — R945 Abnormal results of liver function studies: Secondary | ICD-10-CM

## 2013-12-14 NOTE — Progress Notes (Signed)
Order placed for f/u liver panel.  

## 2013-12-15 ENCOUNTER — Other Ambulatory Visit (INDEPENDENT_AMBULATORY_CARE_PROVIDER_SITE_OTHER): Payer: BC Managed Care – PPO

## 2013-12-15 ENCOUNTER — Ambulatory Visit (INDEPENDENT_AMBULATORY_CARE_PROVIDER_SITE_OTHER): Payer: BC Managed Care – PPO | Admitting: General Surgery

## 2013-12-15 ENCOUNTER — Ambulatory Visit: Payer: BC Managed Care – PPO

## 2013-12-15 ENCOUNTER — Encounter: Payer: Self-pay | Admitting: General Surgery

## 2013-12-15 ENCOUNTER — Encounter: Payer: Self-pay | Admitting: Internal Medicine

## 2013-12-15 VITALS — BP 148/76 | HR 76 | Resp 12 | Ht 65.0 in | Wt 168.0 lb

## 2013-12-15 DIAGNOSIS — N63 Unspecified lump in unspecified breast: Secondary | ICD-10-CM

## 2013-12-15 DIAGNOSIS — Z1211 Encounter for screening for malignant neoplasm of colon: Secondary | ICD-10-CM

## 2013-12-15 DIAGNOSIS — R945 Abnormal results of liver function studies: Secondary | ICD-10-CM

## 2013-12-15 DIAGNOSIS — R7989 Other specified abnormal findings of blood chemistry: Secondary | ICD-10-CM

## 2013-12-15 LAB — HEPATIC FUNCTION PANEL
ALBUMIN: 4 g/dL (ref 3.5–5.2)
ALT: 31 U/L (ref 0–35)
AST: 40 U/L — AB (ref 0–37)
Alkaline Phosphatase: 154 U/L — ABNORMAL HIGH (ref 39–117)
Bilirubin, Direct: 0 mg/dL (ref 0.0–0.3)
TOTAL PROTEIN: 8 g/dL (ref 6.0–8.3)
Total Bilirubin: 0.6 mg/dL (ref 0.3–1.2)

## 2013-12-15 MED ORDER — POLYETHYLENE GLYCOL 3350 17 GM/SCOOP PO POWD: 1.0000 | Freq: Once | ORAL | Status: DC

## 2013-12-15 NOTE — Assessment & Plan Note (Signed)
Blood pressure improved on recheck.  Her readings are wnl.  Check metabolic panel.  Same medication regimen.  Follow.

## 2013-12-15 NOTE — Patient Instructions (Addendum)
Patient to be scheduled for screening colonoscopy. Patient will continue 1 year mammograms. Dr. [name] will take care of this.   Colonoscopy A colonoscopy is an exam to look at the entire large intestine (colon). This exam can help find problems such as tumors, polyps, inflammation, and areas of bleeding. The exam takes about 1 hour.  LET [facility] CARE PROVIDER KNOW ABOUT:   Any allergies you have.  All medicines you are taking, including vitamins, herbs, eye drops, creams, and over-the-counter medicines.  Previous problems you or members of your family have had with the use of anesthetics.  Any blood disorders you have.  Previous surgeries you have had.  Medical conditions you have. RISKS AND COMPLICATIONS  Generally, this is a safe procedure. However, as with any procedure, complications can occur. Possible complications include:  Bleeding.  Tearing or rupture of the colon wall.  Reaction to medicines given during the exam.  Infection (rare). BEFORE THE PROCEDURE   Ask your health care provider about changing or stopping your regular medicines.  You may be prescribed an oral bowel prep. This involves drinking a large amount of medicated liquid, starting the day before your procedure. The liquid will cause you to have multiple loose stools until your stool is almost clear or light green. This cleans out your colon in preparation for the procedure.  Do not eat or drink anything else once you have started the bowel prep, unless your health care provider tells you it is safe to do so.  Arrange for someone to drive you home after the procedure. PROCEDURE   You will be given medicine to help you relax (sedative).  You will lie on your side with your knees bent.  A long, flexible tube with a light and camera on the end (colonoscope) will be inserted through the rectum and into the colon. The camera sends video back to a computer screen as it moves through the colon. The  colonoscope also releases carbon dioxide gas to inflate the colon. This helps your health care provider see the area better.  During the exam, your health care provider may take a small tissue sample (biopsy) to be examined under a microscope if any abnormalities are found.  The exam is finished when the entire colon has been viewed. AFTER THE PROCEDURE   Do not drive for 24 hours after the exam.  You may have a small amount of blood in your stool.  You may pass moderate amounts of gas and have mild abdominal cramping or bloating. This is caused by the gas used to inflate your colon during the exam.  Ask when your test results will be ready and how you will get your results. Make sure you get your test results. Document Released: 08/09/2000 Document Revised: 06/02/2013 Document Reviewed: 04/19/2013 [facility] Patient Information 2014 [place].   Patient is scheduled for a colonoscopy at [facility] on 01/04/14. She will take her Norvasc and Hydrodiuril blood pressure medications at 6 am the morning of her colonoscopy. She is aware of date and all instructions. Miralax prescription sent into her pharmacy.

## 2013-12-15 NOTE — Assessment & Plan Note (Signed)
Saw Dr Bary Castilla.  Biopsy negative.  Mammogram 11/18/13 - Birads I.  Has f/u with Dr Bary Castilla on 12/16/13.

## 2013-12-15 NOTE — Assessment & Plan Note (Signed)
Doing well on her current regimen.  Same medications.  Follow.

## 2013-12-15 NOTE — Progress Notes (Signed)
Patient ID: Dawn Sherman, female   DOB: 1955-10-25, 58 y.o.   MRN: [id number]  Chief Complaint  Patient presents with  . Follow-up    mammogram     HPI Dawn Sherman is a 58 y.o. female who presents for a breast evaluation. The most recent mammogram was done on 11/18/13. Patient does perform regular self breast checks and gets regular mammograms done.  The patient denies any problems with the breasts at this time. She also would like to discuss having a screening colonoscopy as well.   April 2014 vacuum biopsy of the left breast at the 10:00 position had shown evidence of a fibroadenoma. HPI  Past Medical History  Diagnosis Date  . Depression   . Hypertension   . Endometriosis     Past Surgical History  Procedure Laterality Date  . Tah/rso  1999    secondary to bleeding and endometriosis (Dr Vernie Ammons)  . Abdominal hysterectomy      Family History  Problem Relation Age of Onset  . Hypercholesterolemia Mother   . Breast cancer Neg Hx   . Colon cancer Neg Hx     Social History History  Substance Use Topics  . Smoking status: Current Every Day Smoker -- 1.00 packs/day for 20 years    Types: Cigarettes  . Smokeless tobacco: Never Used  . Alcohol Use: Yes     Comment: occasional    Allergies  Allergen Reactions  . Amoxicillin   . Benicar [Olmesartan]   . Codeine Sulfate   . Morphine And Related   . Penicillins   . Sulfate   . Clindamycin/Lincomycin Rash    Current Outpatient Prescriptions  Medication Sig Dispense Refill  . amLODipine (NORVASC) 10 MG tablet TAKE 1 TABLET (10 MG TOTAL) BY MOUTH DAILY.  30 tablet  5  . buPROPion (WELLBUTRIN XL) 150 MG 24 hr tablet TAKE 1 TABLET BY MOUTH EVERY DAY  30 tablet  1  . cetirizine (ZYRTEC) 10 MG tablet TAKE 1 TABLET BY MOUTH EVERY MORNING  30 tablet  5  . hydrochlorothiazide (HYDRODIURIL) 25 MG tablet TAKE 1 TABLET BY MOUTH EVERY DAY  30 tablet  5  . PREMARIN 0.625 MG tablet TAKE 1 TABLET BY MOUTH EVERY DAY  30 tablet  1  .  venlafaxine XR (EFFEXOR-XR) 150 MG 24 hr capsule TAKE ONE CAPSULE BY MOUTH EVERY DAY  30 capsule  1  . polyethylene glycol powder (GLYCOLAX/MIRALAX) powder Take 255 g (1 Container total) by mouth once.  255 g  0   No current facility-administered medications for this visit.    Review of Systems Review of Systems  Constitutional: Negative.   Respiratory: Negative.   Cardiovascular: Negative.   Gastrointestinal: Negative.     Blood pressure 148/76, pulse 76, resp. rate 12, height 5\' 5"  (1.651 m), weight 168 lb (76.204 kg).  Physical Exam Physical Exam  Constitutional: She is oriented to person, place, and time. She appears well-developed and well-nourished.  Neck: Neck supple. No thyromegaly present.  Cardiovascular: Normal rate, regular rhythm and normal heart sounds.   No murmur heard. Pulmonary/Chest: Effort normal and breath sounds normal. Right breast exhibits no inverted nipple, no mass, no nipple discharge, no skin change and no tenderness. Left breast exhibits no inverted nipple, no mass, no nipple discharge, no skin change and no tenderness.  Right breast 1 cup size bigger than left unchanged from before.   Left breast upper inner quadrant thickening.   Lymphadenopathy:    She has no cervical adenopathy.  She has no axillary adenopathy.  Neurological: She is alert and oriented to person, place, and time.  Skin: Skin is warm and dry.    Data Reviewed Mammograms dated 11/18/2013 showed extremely dense breast. No interval change. BI-RAD-1.  Ultrasound examination the area of focal thickening in the 10:00 position of the left breast showed a stable 0.5 x 1.3 cm hypoechoic area with soft lobulations. This is located 6 cm from the nipple. This corresponds to the site of the previously completed core biopsy in April 2014.  The prominent thickening is secondary to breast parenchyma extending to almost the inferior surface of the dermis covering a 3.2 x 3.6 cm  area.  Assessment    Benign breast exam.  Candidate for screening colonoscopy.    Plan    Indications for screening colonoscopy were reviewed. The risks associated with the procedure including those related to bleeding and perforation were discussed.     Patient is scheduled for a colonoscopy at [facility] on 01/04/14. She will take her Norvasc and Hydrodiuril blood pressure medications at 6 am the morning of her colonoscopy. She is aware of date and all instructions. Miralax prescription sent into her pharmacy.   The patient may resume annual screening mammograms with her primary care provider in April 2016.   PCP: Arlyss Queen Yula Crotwell 12/16/2013, 2:04 PM

## 2013-12-16 ENCOUNTER — Other Ambulatory Visit: Payer: Self-pay | Admitting: General Surgery

## 2013-12-16 ENCOUNTER — Telehealth: Payer: Self-pay | Admitting: Internal Medicine

## 2013-12-16 DIAGNOSIS — R7989 Other specified abnormal findings of blood chemistry: Secondary | ICD-10-CM

## 2013-12-16 DIAGNOSIS — R945 Abnormal results of liver function studies: Secondary | ICD-10-CM

## 2013-12-16 DIAGNOSIS — Z1211 Encounter for screening for malignant neoplasm of colon: Secondary | ICD-10-CM | POA: Insufficient documentation

## 2013-12-16 DIAGNOSIS — N63 Unspecified lump in unspecified breast: Secondary | ICD-10-CM | POA: Insufficient documentation

## 2013-12-16 NOTE — Telephone Encounter (Signed)
Order placed for abdominal ultrasound.   

## 2013-12-20 ENCOUNTER — Ambulatory Visit: Payer: Self-pay | Admitting: Internal Medicine

## 2013-12-23 ENCOUNTER — Encounter: Payer: Self-pay | Admitting: *Deleted

## 2013-12-23 ENCOUNTER — Telehealth: Payer: Self-pay | Admitting: *Deleted

## 2013-12-23 DIAGNOSIS — R7989 Other specified abnormal findings of blood chemistry: Secondary | ICD-10-CM

## 2013-12-23 DIAGNOSIS — R945 Abnormal results of liver function studies: Secondary | ICD-10-CM

## 2013-12-23 NOTE — Telephone Encounter (Signed)
Order placed for labs.

## 2013-12-23 NOTE — Telephone Encounter (Signed)
Pt notified of Abdominal U/S results & okay to proceed with the liver & hepatitis panel (needs order placed). Lab appt scheduled for: 12/31/13 (Fri) @ 9:30 AM

## 2013-12-24 ENCOUNTER — Other Ambulatory Visit: Payer: Self-pay | Admitting: Internal Medicine

## 2013-12-31 ENCOUNTER — Other Ambulatory Visit (INDEPENDENT_AMBULATORY_CARE_PROVIDER_SITE_OTHER): Payer: BC Managed Care – PPO

## 2013-12-31 DIAGNOSIS — R7989 Other specified abnormal findings of blood chemistry: Secondary | ICD-10-CM

## 2013-12-31 DIAGNOSIS — R945 Abnormal results of liver function studies: Secondary | ICD-10-CM

## 2013-12-31 LAB — HEPATIC FUNCTION PANEL
ALK PHOS: 132 U/L — AB (ref 39–117)
ALT: 30 U/L (ref 0–35)
AST: 61 U/L — AB (ref 0–37)
Albumin: 4 g/dL (ref 3.5–5.2)
Bilirubin, Direct: 0.1 mg/dL (ref 0.0–0.3)
Total Bilirubin: 0.7 mg/dL (ref 0.2–1.2)
Total Protein: 7.4 g/dL (ref 6.0–8.3)

## 2014-01-01 LAB — HEPATITIS C ANTIBODY: HCV Ab: NEGATIVE

## 2014-01-01 LAB — HEPATITIS B SURFACE ANTIGEN: Hepatitis B Surface Ag: NEGATIVE

## 2014-01-01 LAB — HEPATITIS B CORE ANTIBODY, TOTAL: HEP B C TOTAL AB: NONREACTIVE

## 2014-01-01 LAB — HEPATITIS B CORE ANTIBODY, IGM: Hep B C IgM: NONREACTIVE

## 2014-01-01 LAB — HEPATITIS B SURFACE ANTIBODY,QUALITATIVE: Hep B S Ab: NEGATIVE

## 2014-01-03 LAB — HEPATITIS B E ANTIGEN: Hepatitis Be Antigen: NONREACTIVE

## 2014-01-04 ENCOUNTER — Other Ambulatory Visit: Payer: Self-pay | Admitting: Internal Medicine

## 2014-01-04 ENCOUNTER — Ambulatory Visit: Payer: Self-pay | Admitting: General Surgery

## 2014-01-04 DIAGNOSIS — Z1211 Encounter for screening for malignant neoplasm of colon: Secondary | ICD-10-CM

## 2014-01-04 DIAGNOSIS — R7989 Other specified abnormal findings of blood chemistry: Secondary | ICD-10-CM

## 2014-01-04 DIAGNOSIS — R945 Abnormal results of liver function studies: Secondary | ICD-10-CM

## 2014-01-04 LAB — HM COLONOSCOPY

## 2014-01-04 NOTE — Progress Notes (Signed)
Order placed for f/u liver panel.  

## 2014-01-10 ENCOUNTER — Telehealth: Payer: Self-pay | Admitting: *Deleted

## 2014-01-10 NOTE — Telephone Encounter (Signed)
Pt notified of lab results (left message on 01/04/14) pt states that she will call back & let us know if she is willing to proceed with GI referral at a later date. And she will call back to schedule lab appt too.

## 2014-01-11 ENCOUNTER — Encounter: Payer: Self-pay | Admitting: General Surgery

## 2014-01-18 ENCOUNTER — Encounter: Payer: Self-pay | Admitting: Internal Medicine

## 2014-01-19 ENCOUNTER — Encounter: Payer: Self-pay | Admitting: Internal Medicine

## 2014-01-19 DIAGNOSIS — Z1211 Encounter for screening for malignant neoplasm of colon: Secondary | ICD-10-CM

## 2014-01-23 ENCOUNTER — Other Ambulatory Visit: Payer: Self-pay | Admitting: Internal Medicine

## 2014-01-25 ENCOUNTER — Other Ambulatory Visit: Payer: Self-pay | Admitting: Internal Medicine

## 2014-01-25 ENCOUNTER — Encounter: Payer: Self-pay | Admitting: Internal Medicine

## 2014-03-29 ENCOUNTER — Other Ambulatory Visit: Payer: Self-pay | Admitting: Internal Medicine

## 2014-03-29 NOTE — Telephone Encounter (Signed)
Refilled wellbutrin and effexor #30 with 2 refills.

## 2014-03-29 NOTE — Telephone Encounter (Signed)
Ok to fill 

## 2014-04-21 ENCOUNTER — Ambulatory Visit: Payer: BC Managed Care – PPO | Admitting: Internal Medicine

## 2014-04-25 ENCOUNTER — Other Ambulatory Visit: Payer: Self-pay | Admitting: Internal Medicine

## 2014-06-06 ENCOUNTER — Ambulatory Visit: Payer: BC Managed Care – PPO | Admitting: Internal Medicine

## 2014-06-22 ENCOUNTER — Ambulatory Visit: Payer: BC Managed Care – PPO | Admitting: Internal Medicine

## 2014-06-25 ENCOUNTER — Other Ambulatory Visit: Payer: Self-pay | Admitting: Internal Medicine

## 2014-06-27 ENCOUNTER — Other Ambulatory Visit: Payer: Self-pay | Admitting: Internal Medicine

## 2014-06-27 ENCOUNTER — Encounter: Payer: Self-pay | Admitting: Internal Medicine

## 2014-06-29 ENCOUNTER — Other Ambulatory Visit: Payer: Self-pay | Admitting: Internal Medicine

## 2014-06-29 NOTE — Telephone Encounter (Signed)
Ok to refill until appt, but she needs to keep the January appt.

## 2014-06-29 NOTE — Telephone Encounter (Signed)
Last OV 4.20.15, 3 cancellations since.  Last refill 9.29.15.  Please advise refill

## 2014-07-21 ENCOUNTER — Other Ambulatory Visit: Payer: Self-pay | Admitting: Internal Medicine

## 2014-07-28 ENCOUNTER — Other Ambulatory Visit: Payer: Self-pay | Admitting: Internal Medicine

## 2014-09-14 ENCOUNTER — Ambulatory Visit: Payer: BC Managed Care – PPO | Admitting: Internal Medicine

## 2014-09-18 ENCOUNTER — Other Ambulatory Visit: Payer: Self-pay | Admitting: Internal Medicine

## 2014-09-20 ENCOUNTER — Ambulatory Visit: Payer: BC Managed Care – PPO | Admitting: Internal Medicine

## 2014-09-27 ENCOUNTER — Other Ambulatory Visit: Payer: Self-pay | Admitting: Internal Medicine

## 2014-09-27 NOTE — Telephone Encounter (Signed)
Last OV 4.20.15, last refill 12.28.15.  Please advise refill

## 2014-09-27 NOTE — Telephone Encounter (Signed)
Refilled wellbutrin #30 with one refill.

## 2014-10-22 ENCOUNTER — Other Ambulatory Visit: Payer: Self-pay | Admitting: Internal Medicine

## 2014-10-24 ENCOUNTER — Telehealth: Payer: Self-pay | Admitting: *Deleted

## 2014-10-24 NOTE — Telephone Encounter (Signed)
Pt has had 5 cancellations since 6.15.  Please advise refill

## 2014-10-24 NOTE — Telephone Encounter (Signed)
Spoke with pt, advised her she would need to keep upcoming appoint fur further refill.  Pt states she is aware and her husband has made arrangements to be off to transport her.

## 2014-10-24 NOTE — Telephone Encounter (Signed)
Ok to refill x 2 months.  Please call pt and let her know that she has to keep the next appt.  Document phone call in chart.  Thanks.

## 2014-11-29 ENCOUNTER — Ambulatory Visit (INDEPENDENT_AMBULATORY_CARE_PROVIDER_SITE_OTHER): Payer: BC Managed Care – PPO | Admitting: Internal Medicine

## 2014-11-29 ENCOUNTER — Ambulatory Visit (INDEPENDENT_AMBULATORY_CARE_PROVIDER_SITE_OTHER): Payer: BC Managed Care – PPO | Admitting: Podiatry

## 2014-11-29 ENCOUNTER — Encounter: Payer: Self-pay | Admitting: Podiatry

## 2014-11-29 ENCOUNTER — Other Ambulatory Visit: Payer: Self-pay | Admitting: Internal Medicine

## 2014-11-29 ENCOUNTER — Encounter: Payer: Self-pay | Admitting: Internal Medicine

## 2014-11-29 ENCOUNTER — Ambulatory Visit (INDEPENDENT_AMBULATORY_CARE_PROVIDER_SITE_OTHER): Payer: BC Managed Care – PPO

## 2014-11-29 VITALS — BP 120/80 | HR 78 | Resp 16 | Ht 65.0 in | Wt 178.0 lb

## 2014-11-29 VITALS — BP 120/80 | HR 106 | Temp 98.4°F | Ht 65.0 in | Wt 178.0 lb

## 2014-11-29 DIAGNOSIS — F32A Depression, unspecified: Secondary | ICD-10-CM

## 2014-11-29 DIAGNOSIS — M79672 Pain in left foot: Secondary | ICD-10-CM | POA: Insufficient documentation

## 2014-11-29 DIAGNOSIS — I10 Essential (primary) hypertension: Secondary | ICD-10-CM

## 2014-11-29 DIAGNOSIS — M779 Enthesopathy, unspecified: Secondary | ICD-10-CM

## 2014-11-29 DIAGNOSIS — R7989 Other specified abnormal findings of blood chemistry: Secondary | ICD-10-CM | POA: Diagnosis not present

## 2014-11-29 DIAGNOSIS — Z1239 Encounter for other screening for malignant neoplasm of breast: Secondary | ICD-10-CM

## 2014-11-29 DIAGNOSIS — S93402A Sprain of unspecified ligament of left ankle, initial encounter: Secondary | ICD-10-CM | POA: Diagnosis not present

## 2014-11-29 DIAGNOSIS — E7800 Pure hypercholesterolemia, unspecified: Secondary | ICD-10-CM

## 2014-11-29 DIAGNOSIS — F329 Major depressive disorder, single episode, unspecified: Secondary | ICD-10-CM

## 2014-11-29 DIAGNOSIS — Z0000 Encounter for general adult medical examination without abnormal findings: Secondary | ICD-10-CM

## 2014-11-29 DIAGNOSIS — R945 Abnormal results of liver function studies: Secondary | ICD-10-CM

## 2014-11-29 DIAGNOSIS — Z720 Tobacco use: Secondary | ICD-10-CM

## 2014-11-29 LAB — LIPID PANEL
CHOL/HDL RATIO: 2
CHOLESTEROL: 309 mg/dL — AB (ref 0–200)
HDL: 143.9 mg/dL (ref >39.00)
LDL CALC: 134 mg/dL — AB (ref 0–99)
NonHDL: 165.1
TRIGLYCERIDES: 154 mg/dL — AB (ref 0.0–149.0)
VLDL: 30.8 mg/dL (ref 0.0–40.0)

## 2014-11-29 LAB — HEPATIC FUNCTION PANEL
ALBUMIN: 4.1 g/dL (ref 3.5–5.2)
ALT: 13 U/L (ref 0–35)
AST: 18 U/L (ref 0–37)
Alkaline Phosphatase: 132 U/L — ABNORMAL HIGH (ref 39–117)
BILIRUBIN TOTAL: 0.6 mg/dL (ref 0.2–1.2)
Bilirubin, Direct: 0.1 mg/dL (ref 0.0–0.3)
Total Protein: 7.2 g/dL (ref 6.0–8.3)

## 2014-11-29 LAB — CBC WITH DIFFERENTIAL/PLATELET
BASOS PCT: 0.2 % (ref 0.0–3.0)
Basophils Absolute: 0 10*3/uL (ref 0.0–0.1)
EOS PCT: 0.7 % (ref 0.0–5.0)
Eosinophils Absolute: 0.1 10*3/uL (ref 0.0–0.7)
HEMATOCRIT: 37.6 % (ref 36.0–46.0)
Hemoglobin: 12.9 g/dL (ref 12.0–15.0)
Lymphocytes Relative: 10.9 % — ABNORMAL LOW (ref 12.0–46.0)
Lymphs Abs: 1.4 10*3/uL (ref 0.7–4.0)
MCHC: 34.2 g/dL (ref 30.0–36.0)
MCV: 97 fl (ref 78.0–100.0)
Monocytes Absolute: 1 10*3/uL (ref 0.1–1.0)
Monocytes Relative: 8 % (ref 3.0–12.0)
Neutro Abs: 10.1 10*3/uL — ABNORMAL HIGH (ref 1.4–7.7)
Neutrophils Relative %: 80.2 % — ABNORMAL HIGH (ref 43.0–77.0)
Platelets: 225 10*3/uL (ref 150.0–400.0)
RBC: 3.88 Mil/uL (ref 3.87–5.11)
RDW: 16.3 % — ABNORMAL HIGH (ref 11.5–15.5)
WBC: 12.6 10*3/uL — AB (ref 4.0–10.5)

## 2014-11-29 LAB — BASIC METABOLIC PANEL
BUN: 19 mg/dL (ref 6–23)
CHLORIDE: 98 meq/L (ref 96–112)
CO2: 28 mEq/L (ref 19–32)
CREATININE: 1.31 mg/dL — AB (ref 0.40–1.20)
Calcium: 9.2 mg/dL (ref 8.4–10.5)
GFR: 44.09 mL/min — ABNORMAL LOW (ref >60.00)
Glucose, Bld: 99 mg/dL (ref 70–99)
POTASSIUM: 3.5 meq/L (ref 3.5–5.1)
Sodium: 136 mEq/L (ref 135–145)

## 2014-11-29 LAB — TSH: TSH: 7.37 u[IU]/mL — ABNORMAL HIGH (ref 0.35–4.50)

## 2014-11-29 LAB — URIC ACID: Uric Acid, Serum: 12.6 mg/dL — ABNORMAL HIGH (ref 2.4–7.0)

## 2014-11-29 MED ORDER — MELOXICAM 7.5 MG PO TABS: 7.5000 mg | ORAL_TABLET | Freq: Every day | ORAL | Status: DC

## 2014-11-29 NOTE — Progress Notes (Signed)
   Subjective:    Patient ID: Dawn Sherman, female    DOB: [birth year], 59 y.o.   MRN: [id number]  HPI  59 year old female presents the office once a left ankle pain which started becoming symptomatically yesterday. She denies any history of injury or trauma to the area. She states that she has similar episode apparently 1 month ago for which she saw a chiropractor for. He states that the ankle has become swollen how she denies any redness or any increase in warmth associated with the area. She has pain with ambulation. No other complaints at this time.   Review of Systems  All other systems reviewed and are negative.      Objective:   Physical Exam  AAO 3, NAD DP/PT pulses palpable, CRT less than 3 seconds Protective sensation intact with Simms Weinstein monofilament, vibratory sensation intact, Achilles tendon reflex intact. There is tenderness along the course of the posterior tibial tendon/flexor tendons posterior to the medial malleolus is also inferior. There is no pain along the insertion into the navicular tuberosity. There is mild discomfort along the course of the peroneal tendons. There is no areas of pinpoint bony tenderness or pain with vibratory sensation. There is mild discomfort with ankle joint range of motion how there is no crepitation. There is mild edema overlying the ankle mostly over the medial aspect of the ankle. There is no associated increase in warmth or erythema. There is no areas of tenderness of the foot and no edema to the foot. No proximal tib-fib pain. MMT 4/5 on the left and 5/5 on the right. There is no pain with calf compression, swelling, warmth, erythema. No open lesions or pre-ulcer lesions identified bilaterally.       Assessment & Plan:   59 year old female left ankle pain, likely tendinitis -X-rays were obtained and reviewed the patient. -Treatment options were discussed including alternatives, risks, complications -Discussed likely etiology of  her symptoms. -At this time recommend immobilization in a Tri-Lock ankle brace. -Prescribed meloxicam. Discussed side effects the medication and directed to stop any are to occur. -Ice to the area. -Follow-up in 2 weeks or sooner if any problems are to arise. In the meantime encouraged to call the office with any questions, concerns, changes symptoms. Next appointment if symptoms are improved we'll start rehabilitation exercises discussed want her treatment to help prevent recurrence.

## 2014-11-29 NOTE — Progress Notes (Signed)
Pre visit review using our clinic review tool, if applicable. No additional management support is needed unless otherwise documented below in the visit note. 

## 2014-11-29 NOTE — Progress Notes (Signed)
Patient ID: Dawn Sherman, female   DOB: Jun 08, 1956, 59 y.o.   MRN: [id number]   Subjective:    Patient ID: Dawn Sherman, female    DOB: Feb 04, 1956, 59 y.o.   MRN: [id number]  HPI  Patient here for a scheduled follow up.  Her main complaint today is that of left foot and ankle pain.  States that one month ago, she developed bilateral ankle pain.  Similar to this episode.  Saw a chiropractor.  TENS.  Resolved.  Noticed over the last 1-2 days - increased pain involving the left foot/ankle.  No injury.  Increased pain with walking.  Using a walker.  Taking aleve.   States otherwise doing well.  No cardiac symptoms with increased activity or exertion.  Breathing stable.  Smoking 1/2 ppd.  Has cut down.  Discussed the need to quit.  Handling stress well.     Past Medical History  Diagnosis Date  . Depression   . Hypertension   . Endometriosis     Current Outpatient Prescriptions on File Prior to Visit  Medication Sig Dispense Refill  . amLODipine (NORVASC) 10 MG tablet TAKE 1 TABLET BY MOUTH EVERY DAY 30 tablet 5  . cetirizine (ZYRTEC) 10 MG tablet TAKE 1 TABLET BY MOUTH EVERY MORNING 30 tablet 5  . hydrochlorothiazide (HYDRODIURIL) 25 MG tablet TAKE 1 TABLET BY MOUTH EVERY DAY 30 tablet 5  . polyethylene glycol powder (GLYCOLAX/MIRALAX) powder Take 255 g (1 Container total) by mouth once. 255 g 0  . PREMARIN 0.625 MG tablet TAKE 1 TABLET BY MOUTH EVERY DAY 30 tablet 5  . venlafaxine XR (EFFEXOR-XR) 75 MG 24 hr capsule TAKE 2 CAPSULES BY MOUTH EVERY DAY 60 capsule 1   No current facility-administered medications on file prior to visit.    Review of Systems  Constitutional: Negative for appetite change and unexpected weight change.  HENT: Negative for congestion and sinus pressure.   Respiratory: Negative for cough, chest tightness and shortness of breath.   Cardiovascular: Negative for chest pain, palpitations and leg swelling.  Gastrointestinal: Negative for nausea, vomiting, abdominal  pain and diarrhea.  Musculoskeletal:       Increased pain left ankle and foot.  No injury.  Increased pain with weight bearing.  Using a walker.    Skin: Negative for rash.  Neurological: Negative for dizziness, light-headedness and headaches.  Psychiatric/Behavioral: Negative for dysphoric mood and agitation.       Objective:    Physical Exam  Constitutional: She appears well-developed and well-nourished. No distress.  HENT:  Nose: Nose normal.  Mouth/Throat: Oropharynx is clear and moist.  Neck: Neck supple. No thyromegaly present.  Cardiovascular: Normal rate and regular rhythm.   Pulmonary/Chest: Breath sounds normal. No respiratory distress. She has no wheezes.  Abdominal: Soft. Bowel sounds are normal. There is no tenderness.  Musculoskeletal:  Increased pain to palpation and with weight bearing - left ankle and foot. No swelling extending up the lower leg.    Lymphadenopathy:    She has no cervical adenopathy.  Skin: No rash noted.    BP 120/80 mmHg  Pulse 106  Temp(Src) 98.4 F (36.9 C) (Oral)  Ht 5\' 5"  (1.651 m)  Wt 178 lb (80.74 kg)  BMI 29.62 kg/m2  SpO2 95% Wt Readings from Last 3 Encounters:  11/29/14 178 lb (80.74 kg)  11/29/14 178 lb (80.74 kg)  12/15/13 168 lb (76.204 kg)     Lab Results  Component Value Date   WBC 12.6* 11/29/2014  HGB 12.9 11/29/2014   HCT 37.6 11/29/2014   PLT 225.0 11/29/2014   GLUCOSE 99 11/29/2014   CHOL 309* 11/29/2014   TRIG 154.0* 11/29/2014   HDL 143.90 11/29/2014   LDLDIRECT 53.3 10/14/2012   LDLCALC 134* 11/29/2014   ALT 13 11/29/2014   AST 18 11/29/2014   NA 136 11/29/2014   K 3.5 11/29/2014   CL 98 11/29/2014   CREATININE 1.31* 11/29/2014   BUN 19 11/29/2014   CO2 28 11/29/2014   TSH 7.37* 11/29/2014   INR 1.0 12/13/2013       Assessment & Plan:   Problem List Items Addressed This Visit    Depression    Doing well on current regimen.  Follow.        Relevant Orders   CBC with  Differential/Platelet (Completed)   TSH (Completed)   Foot pain, left - Primary    Increased pain and swelling.  Hurts to walk.  Using a walker.  Called podiatry.  They are going to see her today.  Check metabolic panel, uric acid and cbc.  Further w/up per podiatry.  Hold on xray.        Relevant Orders   Ambulatory referral to Podiatry   Rheumatoid factor (Completed)   Uric acid (Completed)   Health care maintenance    Physical 12/13/13.  Mammogram 11/18/13 - Birads I.  Schedule f/u mammogram.  Colonoscopy 01/04/14 - normal.  Recommended f/u colonoscopy in 10 years.        Hypercholesterolemia    Low cholesterol diet and exercise.  Follow lipid panel.        Relevant Orders   Lipid panel (Completed)   Hepatic function panel   Hypertension    Blood pressure doing well.  Same medication regimen.  Follow pressures.  Check metabolic panel.      Relevant Orders   Basic metabolic panel (Completed)   Tobacco abuse    Discussed the need for smoking cessation.  Has cut down.  Follow.        Other Visit Diagnoses    Abnormal liver function tests        Breast cancer screening        Relevant Orders    MM DIGITAL SCREENING BILATERAL      I spent 25 minutes with the patient and more than 50% of the time was spent in consultation regarding the above.     [name], MD

## 2014-11-30 ENCOUNTER — Encounter: Payer: Self-pay | Admitting: *Deleted

## 2014-11-30 ENCOUNTER — Other Ambulatory Visit: Payer: Self-pay | Admitting: Internal Medicine

## 2014-11-30 DIAGNOSIS — D72829 Elevated white blood cell count, unspecified: Secondary | ICD-10-CM

## 2014-11-30 DIAGNOSIS — N289 Disorder of kidney and ureter, unspecified: Secondary | ICD-10-CM

## 2014-11-30 LAB — RHEUMATOID FACTOR: Rhuematoid fact SerPl-aCnc: 10 IU/mL (ref <=14)

## 2014-11-30 NOTE — Progress Notes (Signed)
Order placed for f/u labs.  

## 2014-12-01 NOTE — Telephone Encounter (Signed)
Left voicemail for patient to call back. Needs Creatinine level rechecked asap (today or tomorrow). Also asked patient to check mychart message or call me asap.

## 2014-12-02 ENCOUNTER — Other Ambulatory Visit (INDEPENDENT_AMBULATORY_CARE_PROVIDER_SITE_OTHER): Payer: BC Managed Care – PPO

## 2014-12-02 ENCOUNTER — Encounter: Payer: Self-pay | Admitting: Internal Medicine

## 2014-12-02 ENCOUNTER — Other Ambulatory Visit: Payer: Self-pay | Admitting: Internal Medicine

## 2014-12-02 DIAGNOSIS — Z0000 Encounter for general adult medical examination without abnormal findings: Secondary | ICD-10-CM | POA: Insufficient documentation

## 2014-12-02 DIAGNOSIS — N289 Disorder of kidney and ureter, unspecified: Secondary | ICD-10-CM | POA: Diagnosis not present

## 2014-12-02 DIAGNOSIS — D72829 Elevated white blood cell count, unspecified: Secondary | ICD-10-CM | POA: Diagnosis not present

## 2014-12-02 DIAGNOSIS — Z720 Tobacco use: Secondary | ICD-10-CM | POA: Insufficient documentation

## 2014-12-02 LAB — CBC WITH DIFFERENTIAL/PLATELET
BASOS ABS: 0 10*3/uL (ref 0.0–0.1)
Basophils Relative: 0.3 % (ref 0.0–3.0)
EOS ABS: 0.2 10*3/uL (ref 0.0–0.7)
Eosinophils Relative: 1.7 % (ref 0.0–5.0)
HEMATOCRIT: 37.9 % (ref 36.0–46.0)
Hemoglobin: 12.9 g/dL (ref 12.0–15.0)
LYMPHS PCT: 13 % (ref 12.0–46.0)
Lymphs Abs: 1.2 10*3/uL (ref 0.7–4.0)
MCHC: 34.1 g/dL (ref 30.0–36.0)
MCV: 97 fl (ref 78.0–100.0)
MONO ABS: 0.8 10*3/uL (ref 0.1–1.0)
MONOS PCT: 8.9 % (ref 3.0–12.0)
NEUTROS PCT: 76.1 % (ref 43.0–77.0)
Neutro Abs: 7 10*3/uL (ref 1.4–7.7)
PLATELETS: 252 10*3/uL (ref 150.0–400.0)
RBC: 3.91 Mil/uL (ref 3.87–5.11)
RDW: 16.2 % — AB (ref 11.5–15.5)
WBC: 9.2 10*3/uL (ref 4.0–10.5)

## 2014-12-02 LAB — BASIC METABOLIC PANEL
BUN: 19 mg/dL (ref 6–23)
CO2: 29 mEq/L (ref 19–32)
Calcium: 9.1 mg/dL (ref 8.4–10.5)
Chloride: 99 mEq/L (ref 96–112)
Creatinine, Ser: 1.13 mg/dL (ref 0.40–1.20)
GFR: 52.29 mL/min — ABNORMAL LOW (ref >60.00)
GLUCOSE: 97 mg/dL (ref 70–99)
POTASSIUM: 3.8 meq/L (ref 3.5–5.1)
SODIUM: 137 meq/L (ref 135–145)

## 2014-12-02 NOTE — Progress Notes (Signed)
Order placed for urinalysis 

## 2014-12-02 NOTE — Assessment & Plan Note (Signed)
Physical 12/13/13.  Mammogram 11/18/13 - Birads I.  Schedule f/u mammogram.  Colonoscopy 01/04/14 - normal.  Recommended f/u colonoscopy in 10 years.

## 2014-12-02 NOTE — Assessment & Plan Note (Signed)
Doing well on current regimen.  Follow.   

## 2014-12-02 NOTE — Assessment & Plan Note (Signed)
Blood pressure doing well.  Same medication regimen.  Follow pressures.  Check metabolic panel.   

## 2014-12-02 NOTE — Assessment & Plan Note (Signed)
Increased pain and swelling.  Hurts to walk.  Using a walker.  Called podiatry.  They are going to see her today.  Check metabolic panel, uric acid and cbc.  Further w/up per podiatry.  Hold on xray.

## 2014-12-02 NOTE — Assessment & Plan Note (Signed)
Low cholesterol diet and exercise.  Follow lipid panel.   

## 2014-12-02 NOTE — Assessment & Plan Note (Signed)
Discussed the need for smoking cessation.  Has cut down.  Follow.

## 2014-12-05 ENCOUNTER — Encounter: Payer: Self-pay | Admitting: *Deleted

## 2014-12-07 NOTE — Telephone Encounter (Signed)
Unread mychart message mailed to patient 

## 2014-12-13 ENCOUNTER — Ambulatory Visit: Payer: BC Managed Care – PPO | Admitting: Podiatry

## 2014-12-15 ENCOUNTER — Ambulatory Visit: Payer: BC Managed Care – PPO | Admitting: Internal Medicine

## 2014-12-22 ENCOUNTER — Encounter: Payer: Self-pay | Admitting: Podiatry

## 2014-12-22 ENCOUNTER — Ambulatory Visit (INDEPENDENT_AMBULATORY_CARE_PROVIDER_SITE_OTHER): Payer: BC Managed Care – PPO | Admitting: Podiatry

## 2014-12-22 VITALS — Ht 65.0 in | Wt 170.0 lb

## 2014-12-22 DIAGNOSIS — M779 Enthesopathy, unspecified: Secondary | ICD-10-CM

## 2014-12-22 NOTE — Patient Instructions (Signed)
Posterior Tibial Tendon Tendinitis with Rehab Tendonitis is a condition that is characterized by inflammation of a tendon or the lining (sheath) that surrounds it. The inflammation is usually caused by damage to the tendon, such as a tendon tear (strain). Sprains are classified into three categories. Grade 1 sprains cause pain, but the tendon is not lengthened. Grade 2 sprains include a lengthened ligament due to the ligament being stretched or partially ruptured. With grade 2 sprains there is still function, although the function may be diminished. Grade 3 sprains are characterized by a complete tear of the tendon or muscle, and function is usually impaired. Posterior tibialis tendonitis is tendonitis of the posterior tibial tendon, which attaches muscles of the lower leg to the foot. The posterior tibial tendon is located in the back of the ankle and helps the body straighten (plantar flex) and rotate inward (medially rotate) the ankle. SYMPTOMS   Pain, tenderness, swelling, warmth, and/or redness over the back of the inner ankle at the posterior tibial tendon or the inner part of the mid-foot.  Pain that worsens with plantar flexion or medial rotation of the ankle.  A crackling sound (crepitation) when the tendon is moved or touched. CAUSES  Posterior tibial tendonitis occurs when damage to the posterior tibial tendon starts an inflammatory response. Common mechanisms of injury include:  Degenerative (occurs with aging) processes that weaken the tendon and make it more susceptible to injury.  Stress placed on the tendon from an increase in the intensity, frequency, or duration of training.  Direct trauma to the ankle.  Returning to activity before a previous ankle injury is allowed to heal. RISK INCREASES WITH:  Activities that involve repetitive and/or stressful plantar flexion (jumping, kicking, or running up/down hills).  Poor strength and flexibility.  Flat feet.  Previous injury  to the foot, ankle, or leg. PREVENTION   Warm up and stretch properly before activity.  Allow for adequate recovery between workouts.  Maintain physical fitness:  Strength, flexibility, and endurance.  Cardiovascular fitness.  Learn and use proper technique. When possible, have a coach correct improper technique.  Complete rehabilitation from a previous foot, ankle, or leg injury.  If you have flat feet, wear arch supports (orthotics). PROGNOSIS  If treated properly, the symptoms of tendonitis usually resolve within 6 weeks. This period may be shorter for injuries caused by direct trauma. RELATED COMPLICATIONS   Prolonged healing time, if improperly treated or reinjured.  Recurrent symptoms that result in a chronic problem.  Partial or complete tendon tear (rupture) requiring surgery. TREATMENT  Treatment initially involves the use of ice and medication to help reduce pain and inflammation. The use of strengthening and stretching exercises may help reduce pain with activity. These exercises may be performed at home or with referral to a therapist. Often times, your caregiver will recommend immobilizing the ankle to allow the tendon to heal. If you have flat feet, you may be advised to wear orthotic arch supports. If symptoms persist for greater than 6 months despite nonsurgical (conservative) treatment, then surgery may be recommended. MEDICATION   If pain medication is necessary, then nonsteroidal anti-inflammatory medications, such as aspirin and ibuprofen, or other minor pain relievers, such as acetaminophen, are often recommended.  Do not take pain medication for 7 days before surgery.  Prescription pain relievers may be given if deemed necessary by your caregiver. Use only as directed and only as much as you need.  Corticosteroid injections may be given by your caregiver. These injections should  be reserved for the most serious cases because they may only be given a certain  number of times. HEAT AND COLD  Cold treatment (icing) relieves pain and reduces inflammation. Cold treatment should be applied for 10 to 15 minutes every 2 to 3 hours for inflammation and pain and immediately after any activity that aggravates your symptoms. Use ice packs or massage the area with a piece of ice (ice massage).  Heat treatment may be used prior to performing the stretching and strengthening activities prescribed by your caregiver, physical therapist, or athletic trainer. Use a heat pack or soak the injury in warm water. SEEK MEDICAL CARE IF:  Treatment seems to offer no benefit, or the condition worsens.  Any medications produce adverse side effects. EXERCISES RANGE OF MOTION (ROM) AND STRETCHING EXERCISES - Posterior Tibial Tendon Tendinitis These exercises may help you when beginning to rehabilitate your injury. Your symptoms may resolve with or without further involvement from your physician, physical therapist or athletic trainer. While completing these exercises, remember:   Restoring tissue flexibility helps normal motion to return to the joints. This allows healthier, less painful movement and activity.  An effective stretch should be held for at least 30 seconds.  A stretch should never be painful. You should only feel a gentle lengthening or release in the stretched tissue. RANGE OF MOTION - Ankle Plantar Flexion   Sit with your right / left leg crossed over your opposite knee.  Use your opposite hand to pull the top of your foot and toes toward you.  You should feel a gentle stretch on the top of your foot/ankle. Hold this position for __________ seconds. Repeat __________ times. Complete this exercise __________ times per day.  RANGE OF MOTION - Ankle Eversion   Sit with your right / left ankle crossed over your opposite knee.  Grip your foot with your opposite hand, placing your thumb on the top of your foot and your fingers across the bottom of your  foot.  Gently push your foot downward with a slight rotation so your littlest toes rise slightly.  You should feel a gentle stretch on the inside of your ankle. Hold the stretch for __________ seconds. Repeat __________ times. Complete this exercise __________ times per day.  RANGE OF MOTION - Ankle Inversion   Sit with your right / left ankle crossed over your opposite knee.  Grip your foot with your opposite hand, placing your thumb on the bottom of your foot and your fingers across the top of your foot.  Gently pull your foot so the smallest toe comes toward you and your thumb pushes the inside of the ball of your foot away from you.  You should feel a gentle stretch on the outside of your ankle. Hold the stretch for __________ seconds. Repeat __________ times. Complete this exercise __________ times per day.  RANGE OF MOTION - Dorsi/Plantar Flexion  While sitting with your right / left knee straight, draw the top of your foot upward by flexing your ankle. Then reverse the motion, pointing your toes downward.  Hold each position for __________ seconds.  After completing your first set of exercises, repeat this exercise with your knee bent. Repeat __________ times. Complete this exercise __________ times per day.  RANGE OF MOTION - Ankle Alphabet  Imagine your right / left big toe is a pen.  Keeping your hip and knee still, write out the entire alphabet with your "pen." Make the letters as large as you can without   increasing any discomfort. Repeat __________ times. Complete this exercise __________ times per day.  STRETCH - Gastrocsoleus   Sit with your right / left leg extended. Holding onto both ends of a belt or towel, loop it around the ball of your foot.  Keeping your right / left ankle and foot relaxed and your knee straight, pull your foot and ankle toward you using the belt/towel.  You should feel a gentle stretch behind your calf or knee. Hold this position for  __________ seconds. Repeat __________ times. Complete this exercise __________ times per day.  STRETCH - Gastroc, Standing   Place hands on wall.  Extend right / left leg, keeping the front knee somewhat bent.  Slightly point your toes inward on your back foot.  Keeping your right / left heel on the floor and your knee straight, shift your weight toward the wall, not allowing your back to arch.  You should feel a gentle stretch in the right / left calf. Hold this position for __________ seconds. Repeat __________ times. Complete this stretch __________ times per day. STRETCH - Soleus, Standing   Place hands on wall.  Extend right / left leg, keeping the other knee somewhat bent.  Slightly point your toes inward on your back foot.  Keep your right / left heel on the floor, bend your back knee, and slightly shift your weight over the back leg so that you feel a gentle stretch deep in your back calf.  Hold this position for __________ seconds. Repeat __________ times. Complete this stretch __________ times per day. STRENGTHENING EXERCISES - Posterior Tibial Tendon Tendinitis These exercises may help you when beginning to rehabilitate your injury. They may resolve your symptoms with or without further involvement from your physician, physical therapist, or athletic trainer. While completing these exercises, remember:   Muscles can gain both the endurance and the strength needed for everyday activities through controlled exercises.  Complete these exercises as instructed by your physician, physical therapist, or athletic trainer. Progress the resistance and repetitions only as guided. STRENGTH - Dorsiflexors  Secure a rubber exercise band/tubing to a fixed object (i.e., table, pole) and loop the other end around your right / left foot.  Sit on the floor facing the fixed object. The band/tubing should be slightly tense when your foot is relaxed.  Slowly draw your foot back toward you  using your ankle and toes.  Hold this position for __________ seconds. Slowly release the tension in the band and return your foot to the starting position. Repeat __________ times. Complete this exercise __________ times per day.  STRENGTH - Towel Curls  Sit in a chair positioned on a non-carpeted surface.  Place your foot on a towel, keeping your heel on the floor.  Pull the towel toward your heel by only curling your toes. Keep your heel on the floor.  If instructed by your physician, physical therapist, or athletic trainer, add ____________________ at the end of the towel. Repeat __________ times. Complete this exercise __________ times per day. STRENGTH - Ankle Eversion   Secure one end of a rubber exercise band/tubing to a fixed object (table, pole). Loop the other end around your foot just before your toes.  Place your fists between your knees. This will focus your strengthening at your ankle.  Drawing the band/tubing across your opposite foot, slowly pull your little toe out and up. Make sure the band/tubing is positioned to resist the entire motion.  Hold this position for __________ seconds.  Have   your muscles resist the band/tubing as it slowly pulls your foot back to the starting position. Repeat __________ times. Complete this exercise __________ times per day.  STRENGTH - Ankle Inversion   Secure one end of a rubber exercise band/tubing to a fixed object (table, pole). Loop the other end around your foot just before your toes.  Place your fists between your knees. This will focus your strengthening at your ankle.  Slowly, pull your big toe up and in, making sure the band/tubing is positioned to resist the entire motion.  Hold this position for __________ seconds.  Have your muscles resist the band/tubing as it slowly pulls your foot back to the starting position. Repeat __________ times. Complete this exercises __________ times per day.  Document Released:  08/12/2005 Document Revised: 12/27/2013 Document Reviewed: 11/24/2008 [facility] Patient Information 2015 [place], [place]. This information is not intended to replace advice given to you by your health care provider. Make sure you discuss any questions you have with your health care provider. Peroneal Tendinitis with Rehab Tendonitis is inflammation of a tendon. Inflammation of the tendons on the back of the outer ankle (peroneal tendons) is known as peroneal tendonitis. The peroneal tendons are responsible for connecting the muscles that allow you to stand on your tiptoes to the bones of the ankle. For this reason, peroneal tendonitis often causes pain when trying to complete such motions. Peroneal tendonitis often involves a tear (strain) of the peroneal tendons. Strains are classified into three categories. Grade 1 strains cause pain, but the tendon is not lengthened. Grade 2 strains include a lengthened ligament, due to the ligament being stretched or partially ruptured. With grade 2 strains there is still function, although function may be decreased. Grade 3 strains involve a complete tear of the tendon or muscle, and function is usually impaired. SYMPTOMS   Pain, tenderness, swelling, warmth, or redness over the back of the outer side of the ankle, the outer part of the mid-foot, or the bottom of the arch.  Pain that gets worse with ankle motion (especially when pushing off or pushing down with the front of the foot), or when standing on the ball of the foot or pushing the foot outward.  Crackling sound (crepitation) when the tendon is moved or touched. CAUSES  Peroneal tendinitis occurs when injury to the peroneal tendons causes the body to respond with inflammation. Common causes of injury include:  An overuse injury, in which the groove behind the outer ankle (where the tendon is located) causes wear on the tendon.  A sudden stress placed on the tendon, such as from an increase in the  intensity, frequency, or duration of training.  Direct hit (trauma) to the tendon.  Return to activity too soon after a previous ankle injury. RISK INCREASES WITH:  Sports that require sudden, repetitive pushing off of the foot, such as jumping or quick starts.  Kicking and running sports, especially running down hills or long distances.  Poor strength and flexibility.  Previous injury to the foot, ankle, or leg. PREVENTION  Warm up and stretch properly before activity.  Allow for adequate recovery between workouts.  Maintain physical fitness:  Strength, flexibility, and endurance.  Cardiovascular fitness.  Complete rehabilitation after previous injury. PROGNOSIS  If treated properly, peroneal tendonitis usually heals within 6 weeks.  RELATED COMPLICATIONS  Longer healing time, if not properly treated or if not given enough time to heal.  Recurring symptoms if activity is resumed too soon, with overuse, or when using  poor technique.  If untreated, tendinitis may result in tendon rupture, requiring surgery. TREATMENT  Treatment first involves the use of ice and medicine to reduce pain and inflammation. The use of strengthening and stretching exercises may help reduce pain with activity. These exercises may be performed at home or with a therapist. Sometimes, the foot and ankle will be restrained for 10 to 14 days to promote healing. Your caregiver may advise that you place a heel lift in your shoes to reduce the stress placed on the tendon. If nonsurgical treatment is unsuccessful, surgery to remove the inflamed tendon lining (sheath) may be advised.  MEDICATION   If pain medicine is needed, nonsteroidal anti-inflammatory medicines (aspirin and ibuprofen), or other minor pain relievers (acetaminophen), are often advised.  Do not take pain medicine for 7 days before surgery.  Prescription pain relievers may be given, if your caregiver thinks they are needed. Use only as  directed and only as much as you need. HEAT AND COLD  Cold treatment (icing) should be applied for 10 to 15 minutes every 2 to 3 hours for inflammation and pain, and immediately after activity that aggravates your symptoms. Use ice packs or an ice massage.  Heat treatment may be used before performing stretching and strengthening activities prescribed by your caregiver, physical therapist, or athletic trainer. Use a heat pack or a warm water soak. SEEK MEDICAL CARE IF:  Symptoms get worse or do not improve in 2 to 4 weeks, despite treatment.  New, unexplained symptoms develop. (Drugs used in treatment may produce side effects.) EXERCISES RANGE OF MOTION (ROM) AND STRETCHING EXERCISES - Peroneal Tendinitis These exercises may help you when beginning to rehabilitate your injury. Your symptoms may resolve with or without further involvement from your physician, physical therapist or athletic trainer. While completing these exercises, remember:   Restoring tissue flexibility helps normal motion to return to the joints. This allows healthier, less painful movement and activity.  An effective stretch should be held for at least 30 seconds.  A stretch should never be painful. You should only feel a gentle lengthening or release in the stretched tissue. RANGE OF MOTION - Ankle Eversion  Sit with your right / left ankle crossed over your opposite knee.  Grip your foot with your opposite hand, placing your thumb on the top of your foot and your fingers across the bottom of your foot.  Gently push your foot downward with a slight rotation, so your littlest toes rise slightly toward the ceiling.  You should feel a gentle stretch on the inside of your ankle. Hold the stretch for __________ seconds. Repeat __________ times. Complete this exercise __________ times per day.  RANGE OF MOTION - Ankle Inversion  Sit with your right / left ankle crossed over your opposite knee.  Grip your foot with  your opposite hand, placing your thumb on the bottom of your foot and your fingers across the top of your foot.  Gently pull your foot so the smallest toe comes toward you and your thumb pushes the inside of the ball of your foot away from you.  You should feel a gentle stretch on the outside of your ankle. Hold the stretch for __________ seconds. Repeat __________ times. Complete this exercise __________ times per day.  RANGE OF MOTION - Ankle Plantar Flexion  Sit with your right / left leg crossed over your opposite knee.  Use your opposite hand to pull the top of your foot and toes toward you.  You  should feel a gentle stretch on the top of your foot and ankle. Hold this position for __________ seconds. Repeat __________ times. Complete __________ times per day.  STRETCH - Gastroc, Standing  Place your hands on a wall.  Extend your right / left leg behind you, keeping the front knee somewhat bent.  Slightly point your toes inward on your back foot.  Keeping your right / left heel on the floor and your knee straight, shift your weight toward the wall, not allowing your back to arch.  You should feel a gentle stretch in the calf. Hold this position for __________ seconds. Repeat __________ times. Complete this stretch __________ times per day. STRETCH - Soleus, Standing  Place your hands on a wall.  Extend your right / left leg behind you, keeping the other knee somewhat bent.  Slightly point your toes inward on your back foot.  Keep your heel on the floor, bend your back knee, and slightly shift your weight over the back leg so that you feel a gentle stretch deep in your back calf.  Hold this position for __________ seconds. Repeat __________ times. Complete this stretch __________ times per day. STRETCH - Gastrocsoleus, Standing Note: This exercise can place a lot of stress on your foot and ankle. Please complete this exercise only if specifically instructed by your  caregiver.   Place the ball of your right / left foot on a step, keeping your other foot firmly on the same step.  Hold on to the wall or a rail for balance.  Slowly lift your other foot, allowing your body weight to press your heel down over the edge of the step.  You should feel a stretch in your right / left calf.  Hold this position for __________ seconds.  Repeat this exercise with a slight bend in your knee. Repeat __________ times. Complete this stretch __________ times per day.  STRENGTHENING EXERCISES - Peroneal Tendinitis  These exercises may help you when beginning to rehabilitate your injury. They may resolve your symptoms with or without further involvement from your physician, physical therapist or athletic trainer. While completing these exercises, remember:   Muscles can gain both the endurance and the strength needed for everyday activities through controlled exercises.  Complete these exercises as instructed by your physician, physical therapist or athletic trainer. Increase the resistance and repetitions only as guided by your caregiver. STRENGTH - Dorsiflexors  Secure a rubber exercise band or tubing to a fixed object (table, pole) and loop the other end around your right / left foot.  Sit on the floor facing the fixed object. The band should be slightly tense when your foot is relaxed.  Slowly draw your foot back toward you, using your ankle and toes.  Hold this position for __________ seconds. Slowly release the tension in the band and return your foot to the starting position. Repeat __________ times. Complete this exercise __________ times per day.  STRENGTH - Towel Curls  Sit in a chair, on a non-carpeted surface.  Place your foot on a towel, keeping your heel on the floor.  Pull the towel toward your heel only by curling your toes. Keep your heel on the floor.  If instructed by your physician, physical therapist or athletic trainer, add weight to the  end of the towel. Repeat __________ times. Complete this exercise __________ times per day. STRENGTH - Ankle Eversion   Secure one end of a rubber exercise band or tubing to a fixed object (table, pole).  Loop the other end around your foot, just before your toes.  Place your fists between your knees. This will focus your strengthening at your ankle.  Drawing the band across your opposite foot, away from the pole, slowly, pull your little toe out and up. Make sure the band is positioned to resist the entire motion.  Hold this position for __________ seconds.  Have your muscles resist the band, as it slowly pulls your foot back to the starting position. Repeat __________ times. Complete this exercise __________ times per day.  Document Released: 08/12/2005 Document Revised: 12/27/2013 Document Reviewed: 11/24/2008 [facility] Patient Information 2015 [place], [place]. This information is not intended to replace advice given to you by your health care provider. Make sure you discuss any questions you have with your health care provider.

## 2014-12-23 ENCOUNTER — Other Ambulatory Visit: Payer: Self-pay | Admitting: Internal Medicine

## 2014-12-26 ENCOUNTER — Other Ambulatory Visit: Payer: Self-pay | Admitting: Internal Medicine

## 2014-12-26 ENCOUNTER — Other Ambulatory Visit: Payer: Self-pay | Admitting: Podiatry

## 2014-12-26 NOTE — Progress Notes (Signed)
Patient ID: Dawn Sherman, female   DOB: 1956/05/21, 59 y.o.   MRN: [id number]  Subjective: 59 year old female follow-up evaluation of left ankle pain, tendinitis. She states that overall she is doing better however she does get some intermittent swelling to the area after prolonged standing. She's been continuing icing as well as when the ankle brace. Her primary care physician to her off of the meloxicam. Denies any systemic complaints such as fevers, chills, nausea, vomiting. No acute changes since last appointment, and no other complaints at this time.   Objective: AAO x3, NAD DP/PT pulses palpable bilaterally, CRT less than 3 seconds Protective sensation intact with Simms Weinstein monofilament, vibratory sensation intact, Achilles tendon reflex intact There is severe decrease in tenderness along the course of the posterior tibial and flexor tendons posterior to the medial malleolus. There is no tenderness palpation along the insertion of the posterior tibial tendon into the navicular tuberosity. There is no tenderness on the course of peroneal tendons. There is no areas of pinpoint bony tenderness or pain the vibratory sensation bilaterally. No open edema, erythema, increase in warmth.  MMT 5/5, ROM WNL. No edema, erythema, increase in warmth to bilateral lower extremities.  No open lesions or pre-ulcerative lesions.  No pain with calf compression, swelling, warmth, erythema  Assessment: 59 year old female with resolving tendinitis  Plan: -All treatment options discussed with the patient including all alternatives, risks, complications.  -Continue with ankle brace as needed however discussed to wean off of it as the pain decreases. Discussed stretching exercises to help rehab the tendons. Continue ice to the area. Prescribed compound cream. -Follow-up in 4 weeks or sooner should any problems arise.  -Patient encouraged to call the office with any questions, concerns, change in symptoms.

## 2014-12-28 ENCOUNTER — Other Ambulatory Visit: Payer: BC Managed Care – PPO

## 2015-01-10 ENCOUNTER — Ambulatory Visit
Admission: RE | Admit: 2015-01-10 | Discharge: 2015-01-10 | Disposition: A | Discharge status: Home / Self Care | Payer: BC Managed Care – PPO | Source: Ambulatory Visit | Attending: Internal Medicine | Admitting: Internal Medicine

## 2015-01-10 DIAGNOSIS — Z1239 Encounter for other screening for malignant neoplasm of breast: Secondary | ICD-10-CM

## 2015-01-19 ENCOUNTER — Ambulatory Visit: Payer: BC Managed Care – PPO | Admitting: Podiatry

## 2015-01-25 ENCOUNTER — Ambulatory Visit
Admission: RE | Admit: 2015-01-25 | Discharge: 2015-01-25 | Disposition: A | Discharge status: Home / Self Care | Payer: BC Managed Care – PPO | Source: Ambulatory Visit | Attending: Internal Medicine | Admitting: Internal Medicine

## 2015-01-25 DIAGNOSIS — Z1231 Encounter for screening mammogram for malignant neoplasm of breast: Secondary | ICD-10-CM | POA: Diagnosis not present

## 2015-02-22 ENCOUNTER — Other Ambulatory Visit: Payer: Self-pay | Admitting: Internal Medicine

## 2015-02-26 ENCOUNTER — Other Ambulatory Visit: Payer: Self-pay | Admitting: Internal Medicine

## 2015-03-03 ENCOUNTER — Encounter: Payer: BC Managed Care – PPO | Admitting: Internal Medicine

## 2015-03-24 ENCOUNTER — Other Ambulatory Visit: Payer: Self-pay | Admitting: Internal Medicine

## 2015-03-24 NOTE — Telephone Encounter (Signed)
Last OV 4/16 ok to fill?

## 2015-03-25 ENCOUNTER — Other Ambulatory Visit: Payer: Self-pay | Admitting: Internal Medicine

## 2015-03-25 NOTE — Telephone Encounter (Signed)
Refilled effexor #60 with 1 refills.  Sent message to [facility] about scheduling a f/u appt.

## 2015-04-22 ENCOUNTER — Other Ambulatory Visit: Payer: Self-pay | Admitting: Internal Medicine

## 2015-05-20 ENCOUNTER — Other Ambulatory Visit: Payer: Self-pay | Admitting: Internal Medicine

## 2015-05-25 ENCOUNTER — Other Ambulatory Visit: Payer: Self-pay | Admitting: Internal Medicine

## 2015-05-25 NOTE — Telephone Encounter (Signed)
Last office visit was 11/29/14.  Please advise refill?

## 2015-05-25 NOTE — Telephone Encounter (Signed)
Ok x 1.  Please schedule f/u appt.  No appt scheduled.

## 2015-06-21 ENCOUNTER — Other Ambulatory Visit: Payer: Self-pay | Admitting: Internal Medicine

## 2015-06-21 NOTE — Telephone Encounter (Signed)
Last OV was 11/29/14? Please advise?

## 2015-06-21 NOTE — Telephone Encounter (Signed)
Have responded.  See attached message.

## 2015-06-21 NOTE — Telephone Encounter (Signed)
Last ov was 11/30/14, Please advise refill?

## 2015-06-21 NOTE — Telephone Encounter (Signed)
Ok to refill wellbutrin x 1, but needs a f/u appt. Scheduled.

## 2015-07-25 ENCOUNTER — Other Ambulatory Visit: Payer: Self-pay | Admitting: Internal Medicine

## 2015-08-08 ENCOUNTER — Other Ambulatory Visit: Payer: Self-pay | Admitting: Internal Medicine

## 2015-08-08 NOTE — Telephone Encounter (Signed)
Needs to schedule a f/u appt with me.  Ok to refill until appt.  Needs to keep appt.

## 2015-08-08 NOTE — Telephone Encounter (Signed)
Last OV was 11/29/14? Please advise?

## 2015-08-09 NOTE — Telephone Encounter (Signed)
Left a message to return my call to schedule an appointment and then refill the medications.

## 2015-09-21 ENCOUNTER — Other Ambulatory Visit: Payer: Self-pay | Admitting: Internal Medicine

## 2015-10-21 ENCOUNTER — Other Ambulatory Visit: Payer: Self-pay | Admitting: Internal Medicine

## 2015-10-27 ENCOUNTER — Other Ambulatory Visit: Payer: Self-pay | Admitting: Internal Medicine

## 2015-10-27 NOTE — Telephone Encounter (Signed)
Needs to schedule cpe.  Once cpe scheduled can refill until cpe.  Needs to keep appt.

## 2015-10-27 NOTE — Telephone Encounter (Signed)
Last visit 4/16 ok to refill Wellbutrin?

## 2015-10-28 ENCOUNTER — Encounter: Payer: Self-pay | Admitting: *Deleted

## 2015-10-28 NOTE — Telephone Encounter (Signed)
It looks like it was sent in already by someone. So I sent pt a mychart notifying her that she needs to setup a CPE appt asap for any further refills.

## 2015-11-22 ENCOUNTER — Other Ambulatory Visit: Payer: Self-pay | Admitting: *Deleted

## 2015-11-22 MED ORDER — VENLAFAXINE HCL ER 150 MG PO CP24: 150.0000 mg | ORAL_CAPSULE | Freq: Every day | ORAL | Status: DC

## 2015-11-22 NOTE — Telephone Encounter (Signed)
Received fax from pharmacy requesting that we send in a Rx for 150mg  QD instead of the 75mg  BID for insurance purposes. They are requiring a PA for quantities greater than 1 capsule per day of the 75mg . New Rx sent

## 2015-11-24 ENCOUNTER — Other Ambulatory Visit: Payer: Self-pay | Admitting: Internal Medicine

## 2015-11-27 ENCOUNTER — Other Ambulatory Visit: Payer: Self-pay | Admitting: Internal Medicine

## 2015-11-27 NOTE — Telephone Encounter (Signed)
Overdue for follow-up, sent mychart message last month requesting an appt with last refill. Next appt on 03/06/16.

## 2015-11-28 ENCOUNTER — Telehealth: Payer: Self-pay | Admitting: Internal Medicine

## 2015-11-28 NOTE — Telephone Encounter (Signed)
I went back and looked and it looks like patient was told to schedule an appt for a physical to get refills and she scheduled for the net available which is in July per the patient.  She can have a earlier appt, when would you like me to move it to? Then can we refill till that appt? Thanks

## 2015-11-28 NOTE — Telephone Encounter (Signed)
Pt went to her pharmacy today and would not fill her buPROPion (WELLBUTRIN XL) 150 MG 24 hr tablet. Would like this refilled. Pt can be reach home number. [facility] [place].

## 2015-11-28 NOTE — Telephone Encounter (Signed)
Need to schedule earlier appt and then refill until appt.

## 2015-11-29 MED ORDER — BUPROPION HCL ER (XL) 150 MG PO TB24: 150.0000 mg | ORAL_TABLET | Freq: Every day | ORAL | Status: DC

## 2015-11-29 NOTE — Telephone Encounter (Signed)
Spoke with Caryl Pina that spot is used already.   4/18 there is a 4pm slot can that be used or May the 2nd at noon

## 2015-11-29 NOTE — Telephone Encounter (Signed)
12/12/15 at 4pm ok.

## 2015-11-29 NOTE — Telephone Encounter (Signed)
If Dawn Sherman does not have anyone to put in the 12/06/15 8:30 slot,, then she can be scheduled for physical then.  Ok to refill her wellbutrin x 1.

## 2015-11-29 NOTE — Telephone Encounter (Signed)
Spoke with patient and scheduled for appt.  I refilled medication for 30 days only.

## 2015-12-12 ENCOUNTER — Other Ambulatory Visit (HOSPITAL_COMMUNITY)
Admission: RE | Admit: 2015-12-12 | Discharge: 2015-12-12 | Disposition: A | Discharge status: Home / Self Care | Payer: BC Managed Care – PPO | Source: Ambulatory Visit | Attending: Internal Medicine | Admitting: Internal Medicine

## 2015-12-12 ENCOUNTER — Encounter: Payer: Self-pay | Admitting: Internal Medicine

## 2015-12-12 ENCOUNTER — Ambulatory Visit (INDEPENDENT_AMBULATORY_CARE_PROVIDER_SITE_OTHER): Payer: BC Managed Care – PPO | Admitting: Internal Medicine

## 2015-12-12 VITALS — BP 140/80 | HR 100 | Temp 98.4°F | Resp 18 | Ht 63.5 in | Wt 157.0 lb

## 2015-12-12 DIAGNOSIS — Z01419 Encounter for gynecological examination (general) (routine) without abnormal findings: Secondary | ICD-10-CM | POA: Insufficient documentation

## 2015-12-12 DIAGNOSIS — Z0000 Encounter for general adult medical examination without abnormal findings: Secondary | ICD-10-CM | POA: Diagnosis not present

## 2015-12-12 DIAGNOSIS — E7800 Pure hypercholesterolemia, unspecified: Secondary | ICD-10-CM

## 2015-12-12 DIAGNOSIS — I10 Essential (primary) hypertension: Secondary | ICD-10-CM

## 2015-12-12 DIAGNOSIS — Z720 Tobacco use: Secondary | ICD-10-CM

## 2015-12-12 DIAGNOSIS — Z1151 Encounter for screening for human papillomavirus (HPV): Secondary | ICD-10-CM | POA: Diagnosis present

## 2015-12-12 DIAGNOSIS — Z124 Encounter for screening for malignant neoplasm of cervix: Secondary | ICD-10-CM | POA: Diagnosis not present

## 2015-12-12 DIAGNOSIS — N63 Unspecified lump in unspecified breast: Secondary | ICD-10-CM

## 2015-12-12 DIAGNOSIS — F32A Depression, unspecified: Secondary | ICD-10-CM

## 2015-12-12 DIAGNOSIS — F329 Major depressive disorder, single episode, unspecified: Secondary | ICD-10-CM

## 2015-12-12 DIAGNOSIS — Z1239 Encounter for other screening for malignant neoplasm of breast: Secondary | ICD-10-CM | POA: Diagnosis not present

## 2015-12-12 MED ORDER — BUPROPION HCL ER (XL) 150 MG PO TB24: 150.0000 mg | ORAL_TABLET | Freq: Every day | ORAL | Status: DC

## 2015-12-12 NOTE — Progress Notes (Signed)
Pre-visit discussion using our clinic review tool. No additional management support is needed unless otherwise documented below in the visit note.  

## 2015-12-12 NOTE — Progress Notes (Signed)
Patient ID: Dawn Sherman, female   DOB: May 04, 1956, 60 y.o.   MRN: [id number]   Subjective:    Patient ID: Dawn Sherman, female    DOB: [birth year], 60 y.o.   MRN: [id number]  HPI  Patient here for her physical exam.  She states she feels good.  Is doing well.  Has adjusted her diet.  Lost weight.  Feels better.  No chest pain or tightness.  No sob.  No acid reflux.  No abdominal pain or cramping.  Bowels stable.  Tries to stay active.     Past Medical History  Diagnosis Date  . Depression   . Hypertension   . Endometriosis    Past Surgical History  Procedure Laterality Date  . Tah/rso  1999    secondary to bleeding and endometriosis (Dr Vernie Ammons)  . Abdominal hysterectomy    . Breast biopsy Right 2015    neg   Family History  Problem Relation Age of Onset  . Hypercholesterolemia Mother   . Breast cancer Neg Hx   . Colon cancer Neg Hx    Social History   Social History  . Marital Status: Married    Spouse Name: N/A  . Number of Children: N/A  . Years of Education: N/A   Social History Main Topics  . Smoking status: Current Every Day Smoker -- 1.00 packs/day for 20 years    Types: Cigarettes  . Smokeless tobacco: Never Used  . Alcohol Use: 0.0 oz/week    0 Standard drinks or equivalent per week     Comment: occasional  . Drug Use: No  . Sexual Activity: Not Asked   Other Topics Concern  . None   Social History Narrative    Outpatient Encounter Prescriptions as of 12/12/2015  Medication Sig  . amLODipine (NORVASC) 10 MG tablet Take 1 tablet (10 mg total) by mouth daily. NEED APPT IN APRIL FOR FURTHER REFILLS. PLEASE CALL OFFICE @ [phone].  Marland Kitchen buPROPion (WELLBUTRIN XL) 150 MG 24 hr tablet Take 1 tablet (150 mg total) by mouth daily.  . cetirizine (ZYRTEC) 10 MG tablet TAKE 1 TABLET BY MOUTH EVERY MORNING  . hydrochlorothiazide (HYDRODIURIL) 25 MG tablet TAKE 1 TABLET BY MOUTH EVERY DAY  . meloxicam (MOBIC) 7.5 MG tablet TAKE 1 TABLET BY MOUTH EVERY DAY  .  polyethylene glycol powder (GLYCOLAX/MIRALAX) powder Take 255 g (1 Container total) by mouth once.  Marland Kitchen PREMARIN 0.625 MG tablet TAKE 1 TABLET BY MOUTH EVERY DAY  . venlafaxine XR (EFFEXOR-XR) 150 MG 24 hr capsule Take 1 capsule (150 mg total) by mouth daily.  . [DISCONTINUED] buPROPion (WELLBUTRIN XL) 150 MG 24 hr tablet Take 1 tablet (150 mg total) by mouth daily.  . [DISCONTINUED] PREMARIN 0.625 MG tablet TAKE 1 TABLET BY MOUTH EVERY DAY   No facility-administered encounter medications on file as of 12/12/2015.    Review of Systems  Constitutional: Negative for appetite change and unexpected weight change.  HENT: Negative for congestion and sinus pressure.   Eyes: Negative for pain and visual disturbance.  Respiratory: Negative for cough, chest tightness and shortness of breath.   Cardiovascular: Negative for chest pain, palpitations and leg swelling.  Gastrointestinal: Negative for nausea, vomiting, abdominal pain and diarrhea.  Genitourinary: Negative for dysuria and difficulty urinating.  Musculoskeletal: Negative for back pain and joint swelling.  Skin: Negative for color change and rash.  Neurological: Negative for dizziness, light-headedness and headaches.  Hematological: Negative for adenopathy. Does not bruise/bleed easily.  Psychiatric/Behavioral: Negative  for dysphoric mood and agitation.       Objective:     Blood pressure rechecked by me:  132/82  Physical Exam  Constitutional: She is oriented to person, place, and time. She appears well-developed and well-nourished. No distress.  HENT:  Nose: Nose normal.  Mouth/Throat: Oropharynx is clear and moist.  Eyes: Right eye exhibits no discharge. Left eye exhibits no discharge. No scleral icterus.  Neck: Neck supple. No thyromegaly present.  Cardiovascular: Normal rate and regular rhythm.   Pulmonary/Chest: Breath sounds normal. No accessory muscle usage. No tachypnea. No respiratory distress. She has no decreased breath  sounds. She has no wheezes. She has no rhonchi. Right breast exhibits no inverted nipple, no mass, no nipple discharge and no tenderness (no axillary adenopathy). Left breast exhibits no inverted nipple, no mass, no nipple discharge and no tenderness (no axilarry adenopathy).  Abdominal: Soft. Bowel sounds are normal. There is no tenderness.  Genitourinary:  GU exam reveals normal external genitalia.  Vaginal vault without lesions.  S/p hysterectomy.  PAP smear of the vaginal cuff performed.  Could not appreciate any adnexal masses or tenderness.    Musculoskeletal: She exhibits no edema or tenderness.  Lymphadenopathy:    She has no cervical adenopathy.  Neurological: She is alert and oriented to person, place, and time.  Skin: Skin is warm. No rash noted. No erythema.  Psychiatric: She has a normal mood and affect. Her behavior is normal.    BP 140/80 mmHg  Pulse 100  Temp(Src) 98.4 F (36.9 C) (Oral)  Resp 18  Ht 5' 3.5" (1.613 m)  Wt 157 lb (71.215 kg)  BMI 27.37 kg/m2  SpO2 97%  LMP  Wt Readings from Last 3 Encounters:  12/12/15 157 lb (71.215 kg)  12/22/14 170 lb (77.111 kg)  11/29/14 178 lb (80.74 kg)     Lab Results  Component Value Date   WBC 9.2 12/02/2014   HGB 12.9 12/02/2014   HCT 37.9 12/02/2014   PLT 252.0 12/02/2014   GLUCOSE 97 12/02/2014   CHOL 309* 11/29/2014   TRIG 154.0* 11/29/2014   HDL 143.90 11/29/2014   LDLDIRECT 53.3 10/14/2012   LDLCALC 134* 11/29/2014   ALT 13 11/29/2014   AST 18 11/29/2014   NA 137 12/02/2014   K 3.8 12/02/2014   CL 99 12/02/2014   CREATININE 1.13 12/02/2014   BUN 19 12/02/2014   CO2 29 12/02/2014   TSH 7.37* 11/29/2014   INR 1.0 12/13/2013    Mm Digital Screening Bilateral  01/30/2015  CLINICAL DATA:  Screening. EXAM: DIGITAL SCREENING BILATERAL MAMMOGRAM WITH CAD COMPARISON:  Previous exam(s). ACR Breast Density Category c: The breast tissue is heterogeneously dense, which may obscure small masses. FINDINGS: There  are no findings suspicious for malignancy. Images were processed with CAD. IMPRESSION: No mammographic evidence of malignancy. A result letter of this screening mammogram will be mailed directly to the patient. RECOMMENDATION: Screening mammogram in one year. (Code:SM-B-01Y) BI-RADS CATEGORY  1: Negative. Electronically Signed   By: Abelardo Diesel M.D.   On: 01/30/2015 10:11       Assessment & Plan:   Problem List Items Addressed This Visit    Breast mass    Saw Dr Bary Castilla.  Biopsy negative.  Mammogram 01/30/15 - Birads I.  Reports no change in her breast.  Follow.        Depression    Doing well on current regimen.  Follow.        Relevant Medications  buPROPion (WELLBUTRIN XL) 150 MG 24 hr tablet   Health care maintenance    Physical today 12/12/15.  Mammogram 01/30/15 - Birads I.  Schedule f/u mammogram.  Colonoscopy 01/04/14 - normal.  Recommended f/u colonoscopy in 10 years.        Hypercholesterolemia    Low cholesterol diet and exercise.  Follow lipid panel.        Relevant Orders   Lipid panel   Hepatic function panel   Hypertension    Blood pressure under good control.  Continue same medication regimen.  Follow pressures.  Follow metabolic panel.        Relevant Orders   CBC with Differential/Platelet   TSH   Basic metabolic panel   Tobacco abuse    Have discussed the need to quit smoking.  Follow.        Other Visit Diagnoses    Screening breast examination    -  Primary    Relevant Orders    MM DIGITAL SCREENING BILATERAL    Pap smear for cervical cancer screening        Relevant Orders    Cytology - PAP (Completed)        [name], MD

## 2015-12-14 LAB — CYTOLOGY - PAP

## 2015-12-15 ENCOUNTER — Encounter: Payer: Self-pay | Admitting: Internal Medicine

## 2015-12-18 ENCOUNTER — Other Ambulatory Visit: Payer: BC Managed Care – PPO

## 2015-12-18 ENCOUNTER — Encounter: Payer: Self-pay | Admitting: Internal Medicine

## 2015-12-18 NOTE — Assessment & Plan Note (Signed)
Doing well on current regimen.  Follow.   

## 2015-12-18 NOTE — Assessment & Plan Note (Signed)
Blood pressure under good control.  Continue same medication regimen.  Follow pressures.  Follow metabolic panel.   

## 2015-12-18 NOTE — Assessment & Plan Note (Signed)
Low cholesterol diet and exercise.  Follow lipid panel.   

## 2015-12-18 NOTE — Assessment & Plan Note (Signed)
Have discussed the need to quit smoking.  Follow.  

## 2015-12-18 NOTE — Assessment & Plan Note (Signed)
Physical today 12/12/15.  Mammogram 01/30/15 - Birads I.  Schedule f/u mammogram.  Colonoscopy 01/04/14 - normal.  Recommended f/u colonoscopy in 10 years.

## 2015-12-18 NOTE — Assessment & Plan Note (Signed)
Saw Dr Bary Castilla.  Biopsy negative.  Mammogram 01/30/15 - Birads I.  Reports no change in her breast.  Follow.

## 2015-12-20 ENCOUNTER — Other Ambulatory Visit: Payer: Self-pay | Admitting: Internal Medicine

## 2015-12-21 NOTE — Telephone Encounter (Signed)
Unread mychart message mailed to patient 

## 2015-12-26 ENCOUNTER — Other Ambulatory Visit: Payer: Self-pay

## 2015-12-26 MED ORDER — HYDROCHLOROTHIAZIDE 25 MG PO TABS: 25.0000 mg | ORAL_TABLET | Freq: Every day | ORAL | Status: DC

## 2015-12-28 ENCOUNTER — Other Ambulatory Visit: Payer: BC Managed Care – PPO

## 2015-12-30 ENCOUNTER — Other Ambulatory Visit: Payer: Self-pay | Admitting: Internal Medicine

## 2016-01-24 ENCOUNTER — Other Ambulatory Visit: Payer: BC Managed Care – PPO

## 2016-01-26 ENCOUNTER — Ambulatory Visit: Payer: BC Managed Care – PPO

## 2016-02-07 ENCOUNTER — Other Ambulatory Visit (INDEPENDENT_AMBULATORY_CARE_PROVIDER_SITE_OTHER): Payer: BC Managed Care – PPO

## 2016-02-07 DIAGNOSIS — E7800 Pure hypercholesterolemia, unspecified: Secondary | ICD-10-CM

## 2016-02-07 DIAGNOSIS — I10 Essential (primary) hypertension: Secondary | ICD-10-CM

## 2016-02-07 LAB — CBC WITH DIFFERENTIAL/PLATELET
BASOS ABS: 0 10*3/uL (ref 0.0–0.1)
Basophils Relative: 0.2 % (ref 0.0–3.0)
EOS ABS: 0.1 10*3/uL (ref 0.0–0.7)
Eosinophils Relative: 0.7 % (ref 0.0–5.0)
HCT: 34.2 % — ABNORMAL LOW (ref 36.0–46.0)
HEMOGLOBIN: 11.3 g/dL — AB (ref 12.0–15.0)
Lymphocytes Relative: 10.5 % — ABNORMAL LOW (ref 12.0–46.0)
Lymphs Abs: 1.1 10*3/uL (ref 0.7–4.0)
MCHC: 32.9 g/dL (ref 30.0–36.0)
MCV: 91.5 fl (ref 78.0–100.0)
Monocytes Absolute: 0.9 10*3/uL (ref 0.1–1.0)
Monocytes Relative: 7.8 % (ref 3.0–12.0)
Neutro Abs: 8.8 10*3/uL — ABNORMAL HIGH (ref 1.4–7.7)
Neutrophils Relative %: 80.8 % — ABNORMAL HIGH (ref 43.0–77.0)
PLATELETS: 288 10*3/uL (ref 150.0–400.0)
RBC: 3.74 Mil/uL — ABNORMAL LOW (ref 3.87–5.11)
RDW: 23.1 % — ABNORMAL HIGH (ref 11.5–15.5)
WBC: 10.9 10*3/uL — AB (ref 4.0–10.5)

## 2016-02-07 LAB — TSH: TSH: 7.33 u[IU]/mL — ABNORMAL HIGH (ref 0.35–4.50)

## 2016-02-07 LAB — BASIC METABOLIC PANEL
BUN: 28 mg/dL — ABNORMAL HIGH (ref 6–23)
CALCIUM: 8.7 mg/dL (ref 8.4–10.5)
CO2: 28 meq/L (ref 19–32)
Chloride: 97 mEq/L (ref 96–112)
Creatinine, Ser: 1.05 mg/dL (ref 0.40–1.20)
GFR: 56.69 mL/min — AB (ref >60.00)
Glucose, Bld: 99 mg/dL (ref 70–99)
POTASSIUM: 3.6 meq/L (ref 3.5–5.1)
SODIUM: 136 meq/L (ref 135–145)

## 2016-02-07 LAB — LIPID PANEL
CHOL/HDL RATIO: 2
CHOLESTEROL: 213 mg/dL — AB (ref 0–200)
HDL: 122.1 mg/dL (ref >39.00)
LDL CALC: 69 mg/dL (ref 0–99)
NonHDL: 90.88
TRIGLYCERIDES: 109 mg/dL (ref 0.0–149.0)
VLDL: 21.8 mg/dL (ref 0.0–40.0)

## 2016-02-07 LAB — HEPATIC FUNCTION PANEL
ALBUMIN: 4.1 g/dL (ref 3.5–5.2)
ALT: 6 U/L (ref 0–35)
AST: 11 U/L (ref 0–37)
Alkaline Phosphatase: 82 U/L (ref 39–117)
BILIRUBIN TOTAL: 0.4 mg/dL (ref 0.2–1.2)
Bilirubin, Direct: 0.1 mg/dL (ref 0.0–0.3)
Total Protein: 7.1 g/dL (ref 6.0–8.3)

## 2016-02-09 ENCOUNTER — Ambulatory Visit: Payer: BC Managed Care – PPO | Attending: Internal Medicine

## 2016-02-18 ENCOUNTER — Other Ambulatory Visit: Payer: Self-pay | Admitting: Internal Medicine

## 2016-02-18 DIAGNOSIS — D649 Anemia, unspecified: Secondary | ICD-10-CM

## 2016-02-18 NOTE — Progress Notes (Signed)
Order placed for f/u labs.  

## 2016-02-21 ENCOUNTER — Encounter: Payer: Self-pay | Admitting: *Deleted

## 2016-02-21 ENCOUNTER — Telehealth: Payer: Self-pay | Admitting: *Deleted

## 2016-02-21 DIAGNOSIS — R7989 Other specified abnormal findings of blood chemistry: Secondary | ICD-10-CM

## 2016-02-21 NOTE — Telephone Encounter (Signed)
TSH placed for lab draw in August

## 2016-02-26 ENCOUNTER — Ambulatory Visit: Payer: BC Managed Care – PPO

## 2016-02-26 NOTE — Telephone Encounter (Signed)
Unread mychart message mailed to patient 

## 2016-03-06 ENCOUNTER — Other Ambulatory Visit: Payer: BC Managed Care – PPO

## 2016-03-06 ENCOUNTER — Encounter: Payer: BC Managed Care – PPO | Admitting: Internal Medicine

## 2016-03-20 ENCOUNTER — Other Ambulatory Visit: Payer: Self-pay | Admitting: Internal Medicine

## 2016-03-28 ENCOUNTER — Ambulatory Visit: Payer: BC Managed Care – PPO

## 2016-04-03 ENCOUNTER — Other Ambulatory Visit: Payer: BC Managed Care – PPO

## 2016-04-09 ENCOUNTER — Other Ambulatory Visit: Payer: BC Managed Care – PPO

## 2016-04-11 ENCOUNTER — Telehealth: Payer: Self-pay | Admitting: Internal Medicine

## 2016-04-11 NOTE — Telephone Encounter (Signed)
Pt called about husband threw away her Rx pills of venlafaxine XR (EFFEXOR-XR) 150 MG 24 hr capsule. By accident.   [id number] [place], [place]  Call pt @ [phone]. Thank you!

## 2016-04-12 NOTE — Telephone Encounter (Signed)
Are you ok with me refilling this medication.  I know its a lower pain med but I would like to know if you are ok with me doing so.

## 2016-04-15 MED ORDER — VENLAFAXINE HCL ER 150 MG PO CP24: 150.0000 mg | ORAL_CAPSULE | Freq: Every day | ORAL | 2 refills | Status: DC

## 2016-04-15 NOTE — Telephone Encounter (Signed)
It appears this was prescribed on 03/20/16 with two refills.  The message is stating that husband threw away her rx.  She should have been near end or rx.  Ok to refill the effexor - to continue her treatment.  Please clarify with pharmacy and ok to refill.

## 2016-04-15 NOTE — Telephone Encounter (Signed)
Refill sent, thanks!

## 2016-04-19 ENCOUNTER — Ambulatory Visit: Payer: BC Managed Care – PPO | Attending: Internal Medicine

## 2016-04-23 ENCOUNTER — Other Ambulatory Visit: Payer: Self-pay | Admitting: Internal Medicine

## 2016-05-01 ENCOUNTER — Other Ambulatory Visit: Payer: Self-pay | Admitting: Internal Medicine

## 2016-06-12 ENCOUNTER — Ambulatory Visit: Payer: BC Managed Care – PPO | Admitting: Internal Medicine

## 2016-06-27 ENCOUNTER — Other Ambulatory Visit: Payer: Self-pay

## 2016-06-27 MED ORDER — HYDROCHLOROTHIAZIDE 25 MG PO TABS: 25.0000 mg | ORAL_TABLET | Freq: Every day | ORAL | 0 refills | Status: DC

## 2016-07-11 ENCOUNTER — Other Ambulatory Visit: Payer: Self-pay | Admitting: Internal Medicine

## 2016-07-11 ENCOUNTER — Other Ambulatory Visit: Payer: Self-pay

## 2016-08-19 ENCOUNTER — Other Ambulatory Visit: Payer: Self-pay | Admitting: Internal Medicine

## 2016-09-07 ENCOUNTER — Other Ambulatory Visit: Payer: Self-pay | Admitting: Internal Medicine

## 2016-09-09 ENCOUNTER — Ambulatory Visit: Payer: BC Managed Care – PPO | Admitting: Internal Medicine

## 2016-09-09 NOTE — Telephone Encounter (Signed)
Pt last OV was on 12/12/15 and last refill was on 08/04/16. Pt next appt is 01/28/17. Ok to refill until appt?

## 2016-09-10 NOTE — Telephone Encounter (Signed)
In reviewing the appt history, she has cancelled numerous appts.  Please discuss with pt that I am not able to prescribe the medication without her being seen.  She has a scheduled appt in 01/2017, but she is going to need to be seen prior to any more refills.

## 2016-09-10 NOTE — Telephone Encounter (Signed)
Sent in rx for effexor #30 with no refills.

## 2016-09-10 NOTE — Telephone Encounter (Signed)
I have scheduled patient to come in on 09/24/16 @ 3 PM. She would like to know if you will refill enough to get to her appointment. I advised that if she did not keep this appointment she will not be able to get any more refills.

## 2016-09-24 ENCOUNTER — Encounter: Payer: Self-pay | Admitting: Internal Medicine

## 2016-09-24 ENCOUNTER — Ambulatory Visit (INDEPENDENT_AMBULATORY_CARE_PROVIDER_SITE_OTHER): Payer: BC Managed Care – PPO | Admitting: Internal Medicine

## 2016-09-24 VITALS — BP 136/84 | HR 100 | Temp 99.0°F | Resp 16 | Ht 64.0 in | Wt 147.8 lb

## 2016-09-24 DIAGNOSIS — F329 Major depressive disorder, single episode, unspecified: Secondary | ICD-10-CM

## 2016-09-24 DIAGNOSIS — Z720 Tobacco use: Secondary | ICD-10-CM

## 2016-09-24 DIAGNOSIS — D649 Anemia, unspecified: Secondary | ICD-10-CM

## 2016-09-24 DIAGNOSIS — F32A Depression, unspecified: Secondary | ICD-10-CM

## 2016-09-24 DIAGNOSIS — R946 Abnormal results of thyroid function studies: Secondary | ICD-10-CM

## 2016-09-24 DIAGNOSIS — E7800 Pure hypercholesterolemia, unspecified: Secondary | ICD-10-CM

## 2016-09-24 DIAGNOSIS — Z1239 Encounter for other screening for malignant neoplasm of breast: Secondary | ICD-10-CM

## 2016-09-24 DIAGNOSIS — I10 Essential (primary) hypertension: Secondary | ICD-10-CM | POA: Diagnosis not present

## 2016-09-24 DIAGNOSIS — R7989 Other specified abnormal findings of blood chemistry: Secondary | ICD-10-CM

## 2016-09-24 DIAGNOSIS — N63 Unspecified lump in unspecified breast: Secondary | ICD-10-CM

## 2016-09-24 DIAGNOSIS — Z1231 Encounter for screening mammogram for malignant neoplasm of breast: Secondary | ICD-10-CM

## 2016-09-24 MED ORDER — AMLODIPINE BESYLATE 10 MG PO TABS: 10.0000 mg | ORAL_TABLET | Freq: Every day | ORAL | 1 refills | Status: DC

## 2016-09-24 MED ORDER — VENLAFAXINE HCL ER 150 MG PO CP24: 150.0000 mg | ORAL_CAPSULE | Freq: Every day | ORAL | 1 refills | Status: DC

## 2016-09-24 MED ORDER — BUPROPION HCL ER (XL) 150 MG PO TB24: 150.0000 mg | ORAL_TABLET | Freq: Every day | ORAL | 1 refills | Status: DC

## 2016-09-24 MED ORDER — HYDROCHLOROTHIAZIDE 25 MG PO TABS: 25.0000 mg | ORAL_TABLET | Freq: Every day | ORAL | 1 refills | Status: DC

## 2016-09-24 NOTE — Progress Notes (Signed)
Pre-visit discussion using our clinic review tool. No additional management support is needed unless otherwise documented below in the visit note.  

## 2016-09-24 NOTE — Progress Notes (Signed)
Patient ID: Dawn Sherman, female   DOB: 12-Aug-1956, 61 y.o.   MRN: [id number]   Subjective:    Patient ID: Dawn Sherman, female    DOB: 29-Jan-1956, 61 y.o.   MRN: [id number]  HPI  Patient here for a scheduled follow up.  I have not seen her since 11/2015.  She states she has been doing well.  Has lost weight.  Has adjusted her diet.  Stays active.  No chest pain.  No sob.  No acid reflux.  No abdominal pain or cramping.  Bowels stable.  Handling stress.  Feels better.     Past Medical History:  Diagnosis Date  . Depression   . Endometriosis   . Hypertension    Past Surgical History:  Procedure Laterality Date  . ABDOMINAL HYSTERECTOMY    . BREAST BIOPSY Right 2015   neg  . TAH/RSO  1999   secondary to bleeding and endometriosis (Dr Vernie Ammons)   Family History  Problem Relation Age of Onset  . Hypercholesterolemia Mother   . Breast cancer Neg Hx   . Colon cancer Neg Hx    Social History   Social History  . Marital status: Married    Spouse name: N/A  . Number of children: N/A  . Years of education: N/A   Social History Main Topics  . Smoking status: Current Every Day Smoker    Packs/day: 1.00    Years: 20.00    Types: Cigarettes  . Smokeless tobacco: Never Used  . Alcohol use 0.0 oz/week     Comment: occasional  . Drug use: No  . Sexual activity: Not Asked   Other Topics Concern  . None   Social History Narrative  . None    Outpatient Encounter Prescriptions as of 09/24/2016  Medication Sig  . amLODipine (NORVASC) 10 MG tablet Take 1 tablet (10 mg total) by mouth daily.  Marland Kitchen buPROPion (WELLBUTRIN XL) 150 MG 24 hr tablet Take 1 tablet (150 mg total) by mouth daily.  . cetirizine (ZYRTEC) 10 MG tablet TAKE 1 TABLET BY MOUTH EVERY MORNING  . hydrochlorothiazide (HYDRODIURIL) 25 MG tablet Take 1 tablet (25 mg total) by mouth daily.  . hydrochlorothiazide (HYDRODIURIL) 25 MG tablet Take 1 tablet (25 mg total) by mouth daily.  . meloxicam (MOBIC) 7.5 MG tablet  TAKE 1 TABLET BY MOUTH EVERY DAY  . polyethylene glycol powder (GLYCOLAX/MIRALAX) powder Take 255 g (1 Container total) by mouth once.  . venlafaxine XR (EFFEXOR-XR) 150 MG 24 hr capsule Take 1 capsule (150 mg total) by mouth daily.  . [DISCONTINUED] amLODipine (NORVASC) 10 MG tablet TAKE 1 TABLET BY MOUTH DAILY.  . [DISCONTINUED] buPROPion (WELLBUTRIN XL) 150 MG 24 hr tablet TAKE 1 TABLET BY MOUTH EVERY DAY  . [DISCONTINUED] hydrochlorothiazide (HYDRODIURIL) 25 MG tablet Take 1 tablet (25 mg total) by mouth daily.  . [DISCONTINUED] venlafaxine XR (EFFEXOR-XR) 150 MG 24 hr capsule TAKE ONE CAPSULE BY MOUTH EVERY DAY  . [DISCONTINUED] amLODipine (NORVASC) 10 MG tablet Take 1 tablet (10 mg total) by mouth daily.  . [DISCONTINUED] PREMARIN 0.625 MG tablet TAKE 1 TABLET BY MOUTH EVERY DAY   No facility-administered encounter medications on file as of 09/24/2016.     Review of Systems  Constitutional: Negative for appetite change and unexpected weight change.  HENT: Negative for congestion and sinus pressure.   Respiratory: Negative for cough, chest tightness and shortness of breath.   Cardiovascular: Negative for chest pain, palpitations and leg swelling.  Gastrointestinal: Negative  for abdominal pain, diarrhea, nausea and vomiting.  Genitourinary: Negative for difficulty urinating and dysuria.  Musculoskeletal: Negative for back pain and joint swelling.  Skin: Negative for color change and rash.  Neurological: Negative for dizziness, light-headedness and headaches.  Psychiatric/Behavioral: Negative for agitation and dysphoric mood.       Objective:     Blood pressure rechecked by me:  136/84  Physical Exam  Constitutional: She appears well-developed and well-nourished. No distress.  HENT:  Nose: Nose normal.  Mouth/Throat: Oropharynx is clear and moist.  Neck: Neck supple. No thyromegaly present.  Cardiovascular: Normal rate and regular rhythm.   Pulmonary/Chest: Breath sounds  normal. No respiratory distress. She has no wheezes.  Abdominal: Soft. Bowel sounds are normal. There is no tenderness.  Musculoskeletal: She exhibits no edema or tenderness.  Lymphadenopathy:    She has no cervical adenopathy.  Skin: No rash noted. No erythema.  Psychiatric: She has a normal mood and affect. Her behavior is normal.    BP 136/84   Pulse 100   Temp 99 F (37.2 C) (Oral)   Resp 16   Ht 5\' 4"  (1.626 m)   Wt 147 lb 12.8 oz (67 kg)   SpO2 98%   BMI 25.37 kg/m  Wt Readings from Last 3 Encounters:  09/24/16 147 lb 12.8 oz (67 kg)  12/12/15 157 lb (71.2 kg)  12/22/14 170 lb (77.1 kg)     Lab Results  Component Value Date   WBC 9.4 09/24/2016   HGB 13.1 09/24/2016   HCT 39.9 09/24/2016   PLT 372.0 09/24/2016   GLUCOSE 76 09/24/2016   CHOL 213 (H) 02/07/2016   TRIG 109.0 02/07/2016   HDL 122.10 02/07/2016   LDLDIRECT 53.3 10/14/2012   LDLCALC 69 02/07/2016   ALT 6 09/24/2016   AST 13 09/24/2016   NA 142 09/24/2016   K 3.3 (L) 09/24/2016   CL 104 09/24/2016   CREATININE 1.28 (H) 09/24/2016   BUN 29 (H) 09/24/2016   CO2 30 09/24/2016   TSH 5.68 (H) 09/24/2016   INR 1.0 12/13/2013       Assessment & Plan:   Problem List Items Addressed This Visit    Breast mass    Previously saw Dr Bary Castilla.  Biopsy negative.  Overdue mammogram.  Schedule.        Depression    Doing well on current regimen.  Follow.        Relevant Medications   buPROPion (WELLBUTRIN XL) 150 MG 24 hr tablet   venlafaxine XR (EFFEXOR-XR) 150 MG 24 hr capsule   Hypercholesterolemia    Low cholesterol diet and exercise.  Follow lipid panel.        Relevant Medications   amLODipine (NORVASC) 10 MG tablet   hydrochlorothiazide (HYDRODIURIL) 25 MG tablet   Hypertension - Primary    Blood pressure on recheck improved.  Follow pressures.  Follow metabolic panel.  Same medication regimen.        Relevant Medications   amLODipine (NORVASC) 10 MG tablet   hydrochlorothiazide  (HYDRODIURIL) 25 MG tablet   Other Relevant Orders   Hepatic function panel (Completed)   Basic metabolic panel (Completed)   Tobacco abuse    Have discussed the need to stop smoking.  Follow.        Other Visit Diagnoses    Anemia       Elevated TSH       Breast cancer screening       Relevant Orders  MM DIGITAL SCREENING BILATERAL       [name], MD

## 2016-09-25 LAB — CBC WITH DIFFERENTIAL/PLATELET
BASOS PCT: 2 % (ref 0.0–3.0)
Basophils Absolute: 0.2 10*3/uL — ABNORMAL HIGH (ref 0.0–0.1)
EOS ABS: 0.2 10*3/uL (ref 0.0–0.7)
Eosinophils Relative: 1.6 % (ref 0.0–5.0)
HEMATOCRIT: 39.9 % (ref 36.0–46.0)
Hemoglobin: 13.1 g/dL (ref 12.0–15.0)
LYMPHS PCT: 26.4 % (ref 12.0–46.0)
Lymphs Abs: 2.5 10*3/uL (ref 0.7–4.0)
MCHC: 33 g/dL (ref 30.0–36.0)
MCV: 98.2 fl (ref 78.0–100.0)
Monocytes Absolute: 1 10*3/uL (ref 0.1–1.0)
Monocytes Relative: 10.3 % (ref 3.0–12.0)
NEUTROS ABS: 5.6 10*3/uL (ref 1.4–7.7)
Neutrophils Relative %: 59.7 % (ref 43.0–77.0)
PLATELETS: 372 10*3/uL (ref 150.0–400.0)
RBC: 4.06 Mil/uL (ref 3.87–5.11)
RDW: 14.5 % (ref 11.5–15.5)
WBC: 9.4 10*3/uL (ref 4.0–10.5)

## 2016-09-25 LAB — FERRITIN: FERRITIN: 22.4 ng/mL (ref 10.0–291.0)

## 2016-09-25 LAB — IBC PANEL
IRON: 88 ug/dL (ref 42–145)
Saturation Ratios: 21.5 % (ref 20.0–50.0)
TRANSFERRIN: 293 mg/dL (ref 212.0–360.0)

## 2016-09-25 LAB — HEPATIC FUNCTION PANEL
ALBUMIN: 4.7 g/dL (ref 3.5–5.2)
ALK PHOS: 113 U/L (ref 39–117)
ALT: 6 U/L (ref 0–35)
AST: 13 U/L (ref 0–37)
BILIRUBIN DIRECT: 0.1 mg/dL (ref 0.0–0.3)
BILIRUBIN TOTAL: 0.4 mg/dL (ref 0.2–1.2)
Total Protein: 7.7 g/dL (ref 6.0–8.3)

## 2016-09-25 LAB — VITAMIN B12: Vitamin B-12: 185 pg/mL — ABNORMAL LOW (ref 211–911)

## 2016-09-25 LAB — BASIC METABOLIC PANEL
BUN: 29 mg/dL — AB (ref 6–23)
CHLORIDE: 104 meq/L (ref 96–112)
CO2: 30 meq/L (ref 19–32)
CREATININE: 1.28 mg/dL — AB (ref 0.40–1.20)
Calcium: 10.1 mg/dL (ref 8.4–10.5)
GFR: 45.01 mL/min — ABNORMAL LOW (ref >60.00)
GLUCOSE: 76 mg/dL (ref 70–99)
Potassium: 3.3 mEq/L — ABNORMAL LOW (ref 3.5–5.1)
Sodium: 142 mEq/L (ref 135–145)

## 2016-09-25 LAB — TSH: TSH: 5.68 u[IU]/mL — ABNORMAL HIGH (ref 0.35–4.50)

## 2016-09-26 ENCOUNTER — Other Ambulatory Visit: Payer: Self-pay | Admitting: Internal Medicine

## 2016-09-26 DIAGNOSIS — R7989 Other specified abnormal findings of blood chemistry: Secondary | ICD-10-CM

## 2016-09-26 DIAGNOSIS — E876 Hypokalemia: Secondary | ICD-10-CM

## 2016-09-26 NOTE — Progress Notes (Signed)
Order placed for f/u metabolic panel.

## 2016-09-27 ENCOUNTER — Telehealth: Payer: Self-pay | Admitting: *Deleted

## 2016-09-27 NOTE — Telephone Encounter (Signed)
Pt has requested lab results  Pt contact  [phone]

## 2016-09-27 NOTE — Telephone Encounter (Signed)
See result note.  

## 2016-09-29 ENCOUNTER — Encounter: Payer: Self-pay | Admitting: Internal Medicine

## 2016-09-29 NOTE — Assessment & Plan Note (Signed)
Have discussed the need to stop smoking.  Follow.

## 2016-09-29 NOTE — Assessment & Plan Note (Signed)
Low cholesterol diet and exercise.  Follow lipid panel.   

## 2016-09-29 NOTE — Assessment & Plan Note (Signed)
Blood pressure on recheck improved.  Follow pressures.  Follow metabolic panel.  Same medication regimen.

## 2016-09-29 NOTE — Assessment & Plan Note (Signed)
Previously saw Dr Bary Castilla.  Biopsy negative.  Overdue mammogram.  Schedule.

## 2016-09-29 NOTE — Assessment & Plan Note (Signed)
Doing well on current regimen.  Follow.   

## 2016-10-02 ENCOUNTER — Other Ambulatory Visit: Payer: BC Managed Care – PPO

## 2016-10-02 ENCOUNTER — Ambulatory Visit: Payer: BC Managed Care – PPO

## 2016-10-03 ENCOUNTER — Ambulatory Visit: Payer: BC Managed Care – PPO

## 2016-10-03 ENCOUNTER — Other Ambulatory Visit: Payer: BC Managed Care – PPO

## 2016-10-10 ENCOUNTER — Ambulatory Visit (INDEPENDENT_AMBULATORY_CARE_PROVIDER_SITE_OTHER): Payer: BC Managed Care – PPO

## 2016-10-10 ENCOUNTER — Other Ambulatory Visit (INDEPENDENT_AMBULATORY_CARE_PROVIDER_SITE_OTHER): Payer: BC Managed Care – PPO

## 2016-10-10 DIAGNOSIS — E876 Hypokalemia: Secondary | ICD-10-CM

## 2016-10-10 DIAGNOSIS — E538 Deficiency of other specified B group vitamins: Secondary | ICD-10-CM

## 2016-10-10 DIAGNOSIS — R7989 Other specified abnormal findings of blood chemistry: Secondary | ICD-10-CM | POA: Diagnosis not present

## 2016-10-10 LAB — BASIC METABOLIC PANEL
BUN: 30 mg/dL — AB (ref 6–23)
CALCIUM: 9.9 mg/dL (ref 8.4–10.5)
CO2: 25 mEq/L (ref 19–32)
Chloride: 102 mEq/L (ref 96–112)
Creatinine, Ser: 1.34 mg/dL — ABNORMAL HIGH (ref 0.40–1.20)
GFR: 42.69 mL/min — AB (ref >60.00)
GLUCOSE: 92 mg/dL (ref 70–99)
POTASSIUM: 3.4 meq/L — AB (ref 3.5–5.1)
Sodium: 135 mEq/L (ref 135–145)

## 2016-10-10 MED ORDER — CYANOCOBALAMIN 1000 MCG/ML IJ SOLN
1000.0000 ug | Freq: Once | INTRAMUSCULAR | Status: AC
  Administered 2016-10-10: 1000 ug via INTRAMUSCULAR

## 2016-10-10 NOTE — Progress Notes (Addendum)
Patient comes in for B 12 injection.  First weekly B 12 injection, injected left deltoid.   Patient tolerated injection well.   Reviewed.  Dr [name]

## 2016-10-11 ENCOUNTER — Other Ambulatory Visit: Payer: Self-pay | Admitting: Internal Medicine

## 2016-10-11 ENCOUNTER — Telehealth: Payer: Self-pay | Admitting: Internal Medicine

## 2016-10-11 DIAGNOSIS — R7989 Other specified abnormal findings of blood chemistry: Secondary | ICD-10-CM

## 2016-10-11 NOTE — Telephone Encounter (Signed)
Pt call requesting refill on venlafaxine XR (EFFEXOR-XR) 150 MG 24 hr capsule. Please advise, thank you!  [id number] [place], [place]  Call pt @ [phone]

## 2016-10-11 NOTE — Progress Notes (Signed)
Orders placed for f/u met b and urinalysis.   

## 2016-10-14 NOTE — Telephone Encounter (Signed)
Was done 09-24-16 and sent to pharmacy for 90 day supply

## 2016-10-15 ENCOUNTER — Encounter: Payer: Self-pay | Admitting: Internal Medicine

## 2016-10-17 ENCOUNTER — Other Ambulatory Visit: Payer: BC Managed Care – PPO

## 2016-10-17 ENCOUNTER — Ambulatory Visit: Payer: BC Managed Care – PPO

## 2016-10-23 ENCOUNTER — Other Ambulatory Visit (INDEPENDENT_AMBULATORY_CARE_PROVIDER_SITE_OTHER): Payer: BC Managed Care – PPO

## 2016-10-23 ENCOUNTER — Other Ambulatory Visit: Payer: BC Managed Care – PPO

## 2016-10-23 ENCOUNTER — Ambulatory Visit (INDEPENDENT_AMBULATORY_CARE_PROVIDER_SITE_OTHER): Payer: BC Managed Care – PPO | Admitting: *Deleted

## 2016-10-23 DIAGNOSIS — Z1231 Encounter for screening mammogram for malignant neoplasm of breast: Secondary | ICD-10-CM | POA: Diagnosis not present

## 2016-10-23 DIAGNOSIS — R7989 Other specified abnormal findings of blood chemistry: Secondary | ICD-10-CM

## 2016-10-23 DIAGNOSIS — Z1239 Encounter for other screening for malignant neoplasm of breast: Secondary | ICD-10-CM

## 2016-10-23 DIAGNOSIS — E538 Deficiency of other specified B group vitamins: Secondary | ICD-10-CM

## 2016-10-23 LAB — BASIC METABOLIC PANEL
BUN: 26 mg/dL — AB (ref 6–23)
CHLORIDE: 100 meq/L (ref 96–112)
CO2: 26 mEq/L (ref 19–32)
Calcium: 9.4 mg/dL (ref 8.4–10.5)
Creatinine, Ser: 1.29 mg/dL — ABNORMAL HIGH (ref 0.40–1.20)
GFR: 44.6 mL/min — ABNORMAL LOW (ref >60.00)
GLUCOSE: 113 mg/dL — AB (ref 70–99)
POTASSIUM: 3.6 meq/L (ref 3.5–5.1)
Sodium: 135 mEq/L (ref 135–145)

## 2016-10-23 NOTE — Addendum Note (Signed)
Addended by: Leeanne Rio on: 10/23/2016 10:22 AM   Modules accepted: Orders

## 2016-10-24 ENCOUNTER — Telehealth: Payer: Self-pay

## 2016-10-24 MED ORDER — CYANOCOBALAMIN 1000 MCG/ML IJ SOLN
1000.0000 ug | Freq: Once | INTRAMUSCULAR | Status: AC
  Administered 2016-10-23: 1000 ug via INTRAMUSCULAR

## 2016-10-24 NOTE — Telephone Encounter (Signed)
Called pt app has been made.

## 2016-10-24 NOTE — Telephone Encounter (Signed)
The 10/31/16 appt is only for injection.  I need to see her - so 10/29/16 if for me to see her.

## 2016-10-24 NOTE — Telephone Encounter (Signed)
Pt informed she has app on 3-8 scheduled already and would like to keep it at that date. She did not want to come in on 10-29-16

## 2016-10-24 NOTE — Telephone Encounter (Signed)
-----   Message from [name], MD sent at 10/24/2016  4:34 AM EST ----- Please call and notify pt that her kidney function is stable from the last check, but still slightly reduced from previous checks.  I would like to see her to discuss possible medication changes and further w/up.  I can see her on 10/29/16 at 8:30.  Stay hydrated.

## 2016-10-24 NOTE — Telephone Encounter (Signed)
lmtrc

## 2016-10-24 NOTE — Progress Notes (Addendum)
Patient presented for B 12 injection for the first of 4 weekly injection, the injection was given in left deltoid muscle. Patient tolerated well.  Reviewed.  Dr [name]

## 2016-10-24 NOTE — Telephone Encounter (Signed)
Pt called back returning your call. Thank you!  Call pt @ [phone]

## 2016-10-29 ENCOUNTER — Encounter: Payer: Self-pay | Admitting: Internal Medicine

## 2016-10-29 ENCOUNTER — Ambulatory Visit (INDEPENDENT_AMBULATORY_CARE_PROVIDER_SITE_OTHER): Payer: BC Managed Care – PPO | Admitting: Internal Medicine

## 2016-10-29 VITALS — BP 138/78 | HR 100 | Temp 98.4°F | Resp 16 | Ht 64.0 in | Wt 148.4 lb

## 2016-10-29 DIAGNOSIS — I10 Essential (primary) hypertension: Secondary | ICD-10-CM

## 2016-10-29 DIAGNOSIS — E7800 Pure hypercholesterolemia, unspecified: Secondary | ICD-10-CM | POA: Diagnosis not present

## 2016-10-29 DIAGNOSIS — R7989 Other specified abnormal findings of blood chemistry: Secondary | ICD-10-CM | POA: Diagnosis not present

## 2016-10-29 DIAGNOSIS — E538 Deficiency of other specified B group vitamins: Secondary | ICD-10-CM | POA: Diagnosis not present

## 2016-10-29 MED ORDER — CYANOCOBALAMIN 1000 MCG/ML IJ SOLN
1000.0000 ug | Freq: Once | INTRAMUSCULAR | Status: AC
  Administered 2016-10-29: 1000 ug via INTRAMUSCULAR

## 2016-10-29 NOTE — Patient Instructions (Signed)
Continue 1/2 hctz per day

## 2016-10-29 NOTE — Progress Notes (Signed)
Patient ID: Dawn Sherman, female   DOB: Apr 13, 1956, 61 y.o.   MRN: [id number]   Subjective:    Patient ID: Dawn Sherman, female    DOB: 21-May-1956, 61 y.o.   MRN: [id number]  HPI  Patient here to discuss her blood pressure and recent elevated creatinine.  Recent found to have elevated creatinine on recent labs.  HCTZ decreased by 1/2.  She is not taking meloxicam.  Instructed to stay hydrated.  States blood pressure doing ok on current medication regimen.  No chest pain.  No sob.  No acid reflux.  No abdominal pain. Bowels moving.     Past Medical History:  Diagnosis Date  . Depression   . Endometriosis   . Hypertension    Past Surgical History:  Procedure Laterality Date  . ABDOMINAL HYSTERECTOMY    . BREAST BIOPSY Right 2015   neg  . TAH/RSO  1999   secondary to bleeding and endometriosis (Dr Vernie Ammons)   Family History  Problem Relation Age of Onset  . Hypercholesterolemia Mother   . Breast cancer Neg Hx   . Colon cancer Neg Hx    Social History   Social History  . Marital status: Married    Spouse name: N/A  . Number of children: N/A  . Years of education: N/A   Social History Main Topics  . Smoking status: Current Every Day Smoker    Packs/day: 1.00    Years: 20.00    Types: Cigarettes  . Smokeless tobacco: Never Used  . Alcohol use 0.0 oz/week     Comment: occasional  . Drug use: No  . Sexual activity: Not Asked   Other Topics Concern  . None   Social History Narrative  . None    Outpatient Encounter Prescriptions as of 10/29/2016  Medication Sig  . amLODipine (NORVASC) 10 MG tablet Take 1 tablet (10 mg total) by mouth daily.  Marland Kitchen buPROPion (WELLBUTRIN XL) 150 MG 24 hr tablet Take 1 tablet (150 mg total) by mouth daily.  . cetirizine (ZYRTEC) 10 MG tablet TAKE 1 TABLET BY MOUTH EVERY MORNING  . hydrochlorothiazide (HYDRODIURIL) 25 MG tablet Take 1 tablet (25 mg total) by mouth daily.  Marland Kitchen venlafaxine XR (EFFEXOR-XR) 150 MG 24 hr capsule Take 1 capsule  (150 mg total) by mouth daily.  . [DISCONTINUED] hydrochlorothiazide (HYDRODIURIL) 25 MG tablet Take 1 tablet (25 mg total) by mouth daily.  . [DISCONTINUED] meloxicam (MOBIC) 7.5 MG tablet TAKE 1 TABLET BY MOUTH EVERY DAY  . [DISCONTINUED] polyethylene glycol powder (GLYCOLAX/MIRALAX) powder Take 255 g (1 Container total) by mouth once.  . [EXPIRED] cyanocobalamin ((VITAMIN B-12)) injection 1,000 mcg    No facility-administered encounter medications on file as of 10/29/2016.     Review of Systems  Constitutional: Negative for appetite change and unexpected weight change.  HENT: Negative for congestion and sinus pressure.   Respiratory: Negative for cough, chest tightness and shortness of breath.   Cardiovascular: Negative for chest pain, palpitations and leg swelling.  Gastrointestinal: Negative for abdominal pain, diarrhea, nausea and vomiting.  Genitourinary: Negative for difficulty urinating and dysuria.  Musculoskeletal: Negative for back pain and joint swelling.  Skin: Negative for color change and rash.  Neurological: Negative for dizziness, light-headedness and headaches.  Psychiatric/Behavioral: Negative for agitation and dysphoric mood.       Objective:    Physical Exam  Constitutional: She appears well-developed and well-nourished. No distress.  HENT:  Nose: Nose normal.  Mouth/Throat: Oropharynx is clear and moist.  Neck: Neck supple. No thyromegaly present.  Cardiovascular: Normal rate and regular rhythm.   Pulmonary/Chest: Breath sounds normal. No respiratory distress. She has no wheezes.  Abdominal: Soft. Bowel sounds are normal. There is no tenderness.  Musculoskeletal: She exhibits no edema or tenderness.  Lymphadenopathy:    She has no cervical adenopathy.  Skin: No rash noted. No erythema.  Psychiatric: She has a normal mood and affect. Her behavior is normal.    BP 138/78 (BP Location: Left Arm, Patient Position: Sitting, Cuff Size: Large)   Pulse 100    Temp 98.4 F (36.9 C) (Oral)   Resp 16   Ht 5\' 4"  (1.626 m)   Wt 148 lb 6.4 oz (67.3 kg)   SpO2 97%   BMI 25.47 kg/m  Wt Readings from Last 3 Encounters:  10/29/16 148 lb 6.4 oz (67.3 kg)  09/24/16 147 lb 12.8 oz (67 kg)  12/12/15 157 lb (71.2 kg)     Lab Results  Component Value Date   WBC 9.4 09/24/2016   HGB 13.1 09/24/2016   HCT 39.9 09/24/2016   PLT 372.0 09/24/2016   GLUCOSE 113 (H) 10/23/2016   CHOL 213 (H) 02/07/2016   TRIG 109.0 02/07/2016   HDL 122.10 02/07/2016   LDLDIRECT 53.3 10/14/2012   LDLCALC 69 02/07/2016   ALT 6 09/24/2016   AST 13 09/24/2016   NA 135 10/23/2016   K 3.6 10/23/2016   CL 100 10/23/2016   CREATININE 1.29 (H) 10/23/2016   BUN 26 (H) 10/23/2016   CO2 26 10/23/2016   TSH 5.68 (H) 09/24/2016   INR 1.0 12/13/2013       Assessment & Plan:   Problem List Items Addressed This Visit    Elevated serum creatinine - Primary    Elevated creatinine.  hctz adjusted.  Stay hydrated.  Recheck metabolic panel and urinalysis.        Relevant Orders   Basic metabolic panel   Urinalysis, Routine w reflex microscopic   Hypercholesterolemia    Last LDL 69. Continue diet and exercise.        Hypertension    Blood pressure doing ok with medication adjustment.  Have her follow her pressures.  Call in readings.  Follow.  Recheck metabolic panel.         Other Visit Diagnoses    Vitamin B 12 deficiency       Relevant Medications   cyanocobalamin ((VITAMIN B-12)) injection 1,000 mcg (Completed)       [name], MD

## 2016-10-29 NOTE — Progress Notes (Signed)
Pre-visit discussion using our clinic review tool. No additional management support is needed unless otherwise documented below in the visit note.  

## 2016-10-31 ENCOUNTER — Ambulatory Visit: Payer: BC Managed Care – PPO

## 2016-11-03 ENCOUNTER — Encounter: Payer: Self-pay | Admitting: Internal Medicine

## 2016-11-03 DIAGNOSIS — N183 Chronic kidney disease, stage 3 (moderate): Secondary | ICD-10-CM

## 2016-11-03 DIAGNOSIS — N1832 Chronic kidney disease, stage 3b: Secondary | ICD-10-CM | POA: Insufficient documentation

## 2016-11-03 NOTE — Assessment & Plan Note (Signed)
Blood pressure doing ok with medication adjustment.  Have her follow her pressures.  Call in readings.  Follow.  Recheck metabolic panel.

## 2016-11-03 NOTE — Assessment & Plan Note (Signed)
Last LDL 69. Continue diet and exercise.

## 2016-11-03 NOTE — Assessment & Plan Note (Signed)
Elevated creatinine.  hctz adjusted.  Stay hydrated.  Recheck metabolic panel and urinalysis.

## 2016-11-05 ENCOUNTER — Telehealth: Payer: Self-pay | Admitting: Internal Medicine

## 2016-11-05 NOTE — Telephone Encounter (Signed)
Pt informed we did not have call from our office.

## 2016-11-05 NOTE — Telephone Encounter (Signed)
Pt called and stated that she was returning someone's call. Did you call the pt?  Pt @ [phone]

## 2016-11-21 ENCOUNTER — Ambulatory Visit: Payer: BC Managed Care – PPO

## 2016-11-26 ENCOUNTER — Ambulatory Visit: Payer: BC Managed Care – PPO | Admitting: Internal Medicine

## 2016-12-03 ENCOUNTER — Other Ambulatory Visit: Payer: BC Managed Care – PPO

## 2016-12-03 ENCOUNTER — Ambulatory Visit: Payer: BC Managed Care – PPO

## 2016-12-27 ENCOUNTER — Ambulatory Visit (INDEPENDENT_AMBULATORY_CARE_PROVIDER_SITE_OTHER): Payer: BC Managed Care – PPO | Admitting: Internal Medicine

## 2016-12-27 ENCOUNTER — Encounter: Payer: Self-pay | Admitting: Internal Medicine

## 2016-12-27 VITALS — BP 124/78 | HR 68 | Temp 99.0°F | Resp 12 | Ht 64.0 in | Wt 146.6 lb

## 2016-12-27 DIAGNOSIS — R7989 Other specified abnormal findings of blood chemistry: Secondary | ICD-10-CM

## 2016-12-27 DIAGNOSIS — Z720 Tobacco use: Secondary | ICD-10-CM | POA: Diagnosis not present

## 2016-12-27 DIAGNOSIS — F32A Depression, unspecified: Secondary | ICD-10-CM

## 2016-12-27 DIAGNOSIS — I10 Essential (primary) hypertension: Secondary | ICD-10-CM | POA: Diagnosis not present

## 2016-12-27 DIAGNOSIS — F329 Major depressive disorder, single episode, unspecified: Secondary | ICD-10-CM | POA: Diagnosis not present

## 2016-12-27 DIAGNOSIS — E559 Vitamin D deficiency, unspecified: Secondary | ICD-10-CM

## 2016-12-27 DIAGNOSIS — Z0000 Encounter for general adult medical examination without abnormal findings: Secondary | ICD-10-CM

## 2016-12-27 DIAGNOSIS — E7800 Pure hypercholesterolemia, unspecified: Secondary | ICD-10-CM | POA: Diagnosis not present

## 2016-12-27 LAB — URINALYSIS, ROUTINE W REFLEX MICROSCOPIC
Bilirubin Urine: NEGATIVE
Hgb urine dipstick: NEGATIVE
Ketones, ur: NEGATIVE
Nitrite: NEGATIVE
Specific Gravity, Urine: 1.01 (ref 1.000–1.030)
Total Protein, Urine: NEGATIVE
Urine Glucose: NEGATIVE
Urobilinogen, UA: 0.2 (ref 0.0–1.0)
pH: 6 (ref 5.0–8.0)

## 2016-12-27 LAB — BASIC METABOLIC PANEL
BUN: 34 mg/dL — AB (ref 6–23)
CALCIUM: 9.8 mg/dL (ref 8.4–10.5)
CO2: 24 mEq/L (ref 19–32)
Chloride: 103 mEq/L (ref 96–112)
Creatinine, Ser: 1.42 mg/dL — ABNORMAL HIGH (ref 0.40–1.20)
GFR: 39.9 mL/min — AB (ref >60.00)
Glucose, Bld: 109 mg/dL — ABNORMAL HIGH (ref 70–99)
POTASSIUM: 3.2 meq/L — AB (ref 3.5–5.1)
SODIUM: 138 meq/L (ref 135–145)

## 2016-12-27 MED ORDER — CYANOCOBALAMIN 1000 MCG/ML IJ SOLN
1000.0000 ug | Freq: Once | INTRAMUSCULAR | Status: AC
  Administered 2016-12-27: 1000 ug via INTRAMUSCULAR

## 2016-12-27 NOTE — Progress Notes (Signed)
Pre-visit discussion using our clinic review tool. No additional management support is needed unless otherwise documented below in the visit note.  

## 2016-12-27 NOTE — Progress Notes (Signed)
Patient ID: Dawn Sherman, female   DOB: 19-Dec-1955, 61 y.o.   MRN: [id number]   Subjective:    Patient ID: Dawn Sherman, female    DOB: [birth year], 61 y.o.   MRN: [id number]  HPI  Patient here for a scheduled follow up.  She reports she is doing well.  Staying active.  No chest pain.  No sob.  No acid reflux.  No abdominal pain or cramping.  Bowels stable.  Discussed recent labs.  Elevated creatinine.  Decreased her hctz to 1/2 tablet per day.  Blood pressure is doing well.  Handling stress.     Past Medical History:  Diagnosis Date  . Depression   . Endometriosis   . Hypertension    Past Surgical History:  Procedure Laterality Date  . ABDOMINAL HYSTERECTOMY    . BREAST BIOPSY Right 2015   neg  . TAH/RSO  1999   secondary to bleeding and endometriosis (Dr Vernie Ammons)   Family History  Problem Relation Age of Onset  . Hypercholesterolemia Mother   . Breast cancer Neg Hx   . Colon cancer Neg Hx    Social History   Social History  . Marital status: Married    Spouse name: N/A  . Number of children: N/A  . Years of education: N/A   Social History Main Topics  . Smoking status: Current Every Day Smoker    Packs/day: 1.00    Years: 20.00    Types: Cigarettes  . Smokeless tobacco: Never Used  . Alcohol use 0.0 oz/week     Comment: occasional  . Drug use: No  . Sexual activity: Not Asked   Other Topics Concern  . None   Social History Narrative  . None    Outpatient Encounter Prescriptions as of 12/27/2016  Medication Sig  . amLODipine (NORVASC) 10 MG tablet Take 1 tablet (10 mg total) by mouth daily.  Marland Kitchen buPROPion (WELLBUTRIN XL) 150 MG 24 hr tablet Take 1 tablet (150 mg total) by mouth daily.  . cetirizine (ZYRTEC) 10 MG tablet TAKE 1 TABLET BY MOUTH EVERY MORNING  . hydrochlorothiazide (HYDRODIURIL) 25 MG tablet Take 1 tablet (25 mg total) by mouth daily.  Marland Kitchen venlafaxine XR (EFFEXOR-XR) 150 MG 24 hr capsule Take 1 capsule (150 mg total) by mouth daily.  .  [EXPIRED] cyanocobalamin ((VITAMIN B-12)) injection 1,000 mcg    No facility-administered encounter medications on file as of 12/27/2016.     Review of Systems  Constitutional: Negative for appetite change and unexpected weight change.  HENT: Negative for congestion and sinus pressure.   Eyes: Negative for pain and visual disturbance.  Respiratory: Negative for cough, chest tightness and shortness of breath.   Cardiovascular: Negative for chest pain, palpitations and leg swelling.  Gastrointestinal: Negative for abdominal pain, diarrhea, nausea and vomiting.  Genitourinary: Negative for difficulty urinating and dysuria.  Musculoskeletal: Negative for back pain and joint swelling.  Skin: Negative for color change and rash.  Neurological: Negative for dizziness, light-headedness and headaches.  Hematological: Negative for adenopathy. Does not bruise/bleed easily.  Psychiatric/Behavioral: Negative for agitation and dysphoric mood.       Objective:     Blood pressure rechecked by me:  124/78  Physical Exam  Constitutional: She is oriented to person, place, and time. She appears well-developed and well-nourished. No distress.  HENT:  Nose: Nose normal.  Mouth/Throat: Oropharynx is clear and moist.  Eyes: Right eye exhibits no discharge. Left eye exhibits no discharge. No scleral icterus.  Neck:  Neck supple. No thyromegaly present.  Cardiovascular: Normal rate and regular rhythm.   Pulmonary/Chest: Breath sounds normal. No accessory muscle usage. No tachypnea. No respiratory distress. She has no decreased breath sounds. She has no wheezes. She has no rhonchi. Right breast exhibits no inverted nipple, no mass, no nipple discharge and no tenderness (no axillary adenopathy). Left breast exhibits no inverted nipple, no mass, no nipple discharge and no tenderness (no axilarry adenopathy).  Abdominal: Soft. Bowel sounds are normal. There is no tenderness.  Musculoskeletal: She exhibits no edema  or tenderness.  Lymphadenopathy:    She has no cervical adenopathy.  Neurological: She is alert and oriented to person, place, and time.  Skin: Skin is warm. No rash noted. No erythema.  Psychiatric: She has a normal mood and affect. Her behavior is normal.    BP 124/78   Pulse 68   Temp 99 F (37.2 C) (Oral)   Resp 12   Ht 5\' 4"  (1.626 m)   Wt 146 lb 9.6 oz (66.5 kg)   SpO2 96%   BMI 25.16 kg/m  Wt Readings from Last 3 Encounters:  12/27/16 146 lb 9.6 oz (66.5 kg)  10/29/16 148 lb 6.4 oz (67.3 kg)  09/24/16 147 lb 12.8 oz (67 kg)     Lab Results  Component Value Date   WBC 9.4 09/24/2016   HGB 13.1 09/24/2016   HCT 39.9 09/24/2016   PLT 372.0 09/24/2016   GLUCOSE 109 (H) 12/27/2016   CHOL 213 (H) 02/07/2016   TRIG 109.0 02/07/2016   HDL 122.10 02/07/2016   LDLDIRECT 53.3 10/14/2012   LDLCALC 69 02/07/2016   ALT 6 09/24/2016   AST 13 09/24/2016   NA 138 12/27/2016   K 3.2 (L) 12/27/2016   CL 103 12/27/2016   CREATININE 1.42 (H) 12/27/2016   BUN 34 (H) 12/27/2016   CO2 24 12/27/2016   TSH 5.68 (H) 09/24/2016   INR 1.0 12/13/2013       Assessment & Plan:   Problem List Items Addressed This Visit    Depression    Doing well on current regimen.  Follow.        Elevated serum creatinine    Elevated serum creatinine.  hctz adjusted.  Follow metabolic panel and urine.         Relevant Orders   Urine Culture (Completed)   Health care maintenance    Physical today 12/27/16.  Schedule mammogram.  Colonoscopy 01/04/14 - normal.  Recommended f/u colonoscopy in 10 years.        Hypercholesterolemia    Low cholesterol diet and exercise.  Follow lipid panel.        Hypertension    Blood pressure under good control.  Continue same medication regimen.  Follow pressures.  Follow metabolic panel.        Tobacco abuse    Discussed the need to quit smoking.  She declines to stop.         Other Visit Diagnoses    Vitamin D deficiency    -  Primary   Relevant  Medications   cyanocobalamin ((VITAMIN B-12)) injection 1,000 mcg (Completed)       [name], MD

## 2016-12-30 ENCOUNTER — Other Ambulatory Visit: Payer: Self-pay | Admitting: Internal Medicine

## 2016-12-30 DIAGNOSIS — R7989 Other specified abnormal findings of blood chemistry: Secondary | ICD-10-CM

## 2016-12-30 LAB — URINE CULTURE

## 2016-12-30 NOTE — Progress Notes (Signed)
Order placed for met b 

## 2016-12-30 NOTE — Progress Notes (Signed)
Order placed for nephrology referral.   °

## 2016-12-31 ENCOUNTER — Other Ambulatory Visit: Payer: Self-pay | Admitting: Internal Medicine

## 2016-12-31 ENCOUNTER — Telehealth: Payer: Self-pay | Admitting: *Deleted

## 2016-12-31 DIAGNOSIS — R319 Hematuria, unspecified: Secondary | ICD-10-CM

## 2016-12-31 MED ORDER — CIPROFLOXACIN HCL 250 MG PO TABS: 250.0000 mg | ORAL_TABLET | Freq: Two times a day (BID) | ORAL | 0 refills | Status: DC

## 2016-12-31 NOTE — Telephone Encounter (Signed)
See lab note.  Looks like you tried to call pt.  Thanks

## 2016-12-31 NOTE — Telephone Encounter (Signed)
Patient requested lab results Pt contact [phone]

## 2016-12-31 NOTE — Telephone Encounter (Signed)
Patient notified of results and denies allergy to cipro, called cipro in as requested by PCP.

## 2016-12-31 NOTE — Progress Notes (Signed)
Order placed for f/u urinalysis 

## 2016-12-31 NOTE — Telephone Encounter (Signed)
Please advise 

## 2016-12-31 NOTE — Telephone Encounter (Signed)
-----   Message from [name], MD sent at 12/31/2016  5:44 AM EDT ----- Please notify pt that her urine revealed infection present.  Please confirm no allergy to cipro.  If no, then cipro 250mg  bid x 5 days.  Have her recheck a urinalysis 1-2 weeks after completing the abx.

## 2017-01-05 ENCOUNTER — Encounter: Payer: Self-pay | Admitting: Internal Medicine

## 2017-01-05 NOTE — Assessment & Plan Note (Signed)
Low cholesterol diet and exercise.  Follow lipid panel.   

## 2017-01-05 NOTE — Assessment & Plan Note (Signed)
Doing well on current regimen.  Follow.   

## 2017-01-05 NOTE — Assessment & Plan Note (Signed)
Discussed the need to quit smoking.  She declines to stop.

## 2017-01-05 NOTE — Assessment & Plan Note (Signed)
Physical today 12/27/16.  Schedule mammogram.  Colonoscopy 01/04/14 - normal.  Recommended f/u colonoscopy in 10 years.

## 2017-01-05 NOTE — Assessment & Plan Note (Signed)
Blood pressure under good control.  Continue same medication regimen.  Follow pressures.  Follow metabolic panel.   

## 2017-01-05 NOTE — Assessment & Plan Note (Signed)
Elevated serum creatinine.  hctz adjusted.  Follow metabolic panel and urine.

## 2017-01-07 ENCOUNTER — Other Ambulatory Visit: Payer: BC Managed Care – PPO

## 2017-01-22 ENCOUNTER — Other Ambulatory Visit (INDEPENDENT_AMBULATORY_CARE_PROVIDER_SITE_OTHER): Payer: BC Managed Care – PPO

## 2017-01-22 DIAGNOSIS — R7989 Other specified abnormal findings of blood chemistry: Secondary | ICD-10-CM | POA: Diagnosis not present

## 2017-01-22 DIAGNOSIS — R319 Hematuria, unspecified: Secondary | ICD-10-CM | POA: Diagnosis not present

## 2017-01-22 LAB — BASIC METABOLIC PANEL
BUN: 47 mg/dL — ABNORMAL HIGH (ref 6–23)
CALCIUM: 10 mg/dL (ref 8.4–10.5)
CO2: 23 mEq/L (ref 19–32)
Chloride: 104 mEq/L (ref 96–112)
Creatinine, Ser: 1.61 mg/dL — ABNORMAL HIGH (ref 0.40–1.20)
GFR: 34.51 mL/min — AB (ref >60.00)
GLUCOSE: 98 mg/dL (ref 70–99)
POTASSIUM: 4 meq/L (ref 3.5–5.1)
Sodium: 136 mEq/L (ref 135–145)

## 2017-01-22 LAB — URINALYSIS, ROUTINE W REFLEX MICROSCOPIC
BILIRUBIN URINE: NEGATIVE
KETONES UR: NEGATIVE
LEUKOCYTES UA: NEGATIVE
NITRITE: NEGATIVE
PH: 5.5 (ref 5.0–8.0)
RBC / HPF: NONE SEEN (ref 0–?)
Specific Gravity, Urine: 1.015 (ref 1.000–1.030)
TOTAL PROTEIN, URINE-UPE24: NEGATIVE
URINE GLUCOSE: NEGATIVE
UROBILINOGEN UA: 0.2 (ref 0.0–1.0)

## 2017-01-23 ENCOUNTER — Telehealth: Payer: Self-pay | Admitting: *Deleted

## 2017-01-23 ENCOUNTER — Other Ambulatory Visit: Payer: Self-pay | Admitting: Internal Medicine

## 2017-01-23 DIAGNOSIS — R944 Abnormal results of kidney function studies: Secondary | ICD-10-CM

## 2017-01-23 NOTE — Telephone Encounter (Signed)
Patient has requested lab results  Pt contact [phone]

## 2017-01-23 NOTE — Progress Notes (Signed)
Order placed for renal ultrasound.  

## 2017-01-23 NOTE — Telephone Encounter (Signed)
See lab note spoke to patient

## 2017-01-24 ENCOUNTER — Ambulatory Visit: Payer: BC Managed Care – PPO

## 2017-01-27 ENCOUNTER — Ambulatory Visit
Admission: RE | Admit: 2017-01-27 | Discharge: 2017-01-27 | Disposition: A | Discharge status: Home / Self Care | Payer: BC Managed Care – PPO | Source: Ambulatory Visit | Attending: Internal Medicine | Admitting: Internal Medicine

## 2017-01-27 DIAGNOSIS — R944 Abnormal results of kidney function studies: Secondary | ICD-10-CM | POA: Insufficient documentation

## 2017-01-28 ENCOUNTER — Ambulatory Visit: Payer: BC Managed Care – PPO | Admitting: Internal Medicine

## 2017-01-29 ENCOUNTER — Ambulatory Visit: Payer: BC Managed Care – PPO | Admitting: Internal Medicine

## 2017-03-10 ENCOUNTER — Other Ambulatory Visit (INDEPENDENT_AMBULATORY_CARE_PROVIDER_SITE_OTHER): Payer: Self-pay | Admitting: Nephrology

## 2017-03-10 DIAGNOSIS — I10 Essential (primary) hypertension: Secondary | ICD-10-CM

## 2017-03-12 ENCOUNTER — Ambulatory Visit (INDEPENDENT_AMBULATORY_CARE_PROVIDER_SITE_OTHER): Payer: BC Managed Care – PPO

## 2017-03-12 DIAGNOSIS — I10 Essential (primary) hypertension: Secondary | ICD-10-CM

## 2017-04-02 ENCOUNTER — Ambulatory Visit (INDEPENDENT_AMBULATORY_CARE_PROVIDER_SITE_OTHER): Payer: BC Managed Care – PPO | Admitting: Internal Medicine

## 2017-04-02 ENCOUNTER — Encounter: Payer: Self-pay | Admitting: Internal Medicine

## 2017-04-02 DIAGNOSIS — E7800 Pure hypercholesterolemia, unspecified: Secondary | ICD-10-CM | POA: Diagnosis not present

## 2017-04-02 DIAGNOSIS — I10 Essential (primary) hypertension: Secondary | ICD-10-CM

## 2017-04-02 DIAGNOSIS — N183 Chronic kidney disease, stage 3 unspecified: Secondary | ICD-10-CM

## 2017-04-02 NOTE — Progress Notes (Signed)
Patient ID: Dawn Sherman, female   DOB: 02-12-1956, 61 y.o.   MRN: [id number]   Subjective:    Patient ID: Dawn Sherman, female    DOB: [birth year], 61 y.o.   MRN: [id number]  HPI  Patient here for a scheduled follow up.  Increased stress with family and financial issues.  Discussed with her today.  She feels she is handling things relatively well.  Still with increased stress.  No chest pain.  No sob.  No acid reflux.  No abdominal pain.  Bowels moving.  Found to have decreased renal function recently.  Seeing nephrology.  Just had renal artery duplex.  States was negative.  Was started on losartan.     Past Medical History:  Diagnosis Date  . Depression   . Endometriosis   . Hypertension    Past Surgical History:  Procedure Laterality Date  . ABDOMINAL HYSTERECTOMY    . BREAST BIOPSY Right 2015   neg  . TAH/RSO  1999   secondary to bleeding and endometriosis (Dr Vernie Ammons)   Family History  Problem Relation Age of Onset  . Hypercholesterolemia Mother   . Breast cancer Neg Hx   . Colon cancer Neg Hx    Social History   Social History  . Marital status: Married    Spouse name: N/A  . Number of children: N/A  . Years of education: N/A   Social History Main Topics  . Smoking status: Current Every Day Smoker    Packs/day: 1.00    Years: 20.00    Types: Cigarettes  . Smokeless tobacco: Never Used  . Alcohol use 0.0 oz/week     Comment: occasional  . Drug use: No  . Sexual activity: Not Asked   Other Topics Concern  . None   Social History Narrative  . None    Outpatient Encounter Prescriptions as of 04/02/2017  Medication Sig  . amLODipine (NORVASC) 10 MG tablet Take 1 tablet (10 mg total) by mouth daily.  Marland Kitchen buPROPion (WELLBUTRIN XL) 150 MG 24 hr tablet Take 1 tablet (150 mg total) by mouth daily.  . cetirizine (ZYRTEC) 10 MG tablet TAKE 1 TABLET BY MOUTH EVERY MORNING  . hydrochlorothiazide (HYDRODIURIL) 25 MG tablet Take 1 tablet (25 mg total) by mouth daily.    Marland Kitchen losartan (COZAAR) 50 MG tablet   . venlafaxine XR (EFFEXOR-XR) 150 MG 24 hr capsule Take 1 capsule (150 mg total) by mouth daily.  . [DISCONTINUED] ciprofloxacin (CIPRO) 250 MG tablet Take 1 tablet (250 mg total) by mouth 2 (two) times daily.   No facility-administered encounter medications on file as of 04/02/2017.     Review of Systems  Constitutional: Negative for appetite change and unexpected weight change.  HENT: Negative for congestion and sinus pressure.   Respiratory: Negative for cough, chest tightness and shortness of breath.   Cardiovascular: Negative for chest pain, palpitations and leg swelling.  Gastrointestinal: Negative for abdominal pain, diarrhea, nausea and vomiting.  Genitourinary: Negative for difficulty urinating and dysuria.  Musculoskeletal: Negative for back pain and joint swelling.  Skin: Negative for color change and rash.  Neurological: Negative for dizziness, light-headedness and headaches.  Psychiatric/Behavioral: Negative for agitation and dysphoric mood.       Objective:     Blood pressure rechecked by me:  138/88  Physical Exam  Constitutional: She appears well-developed and well-nourished. No distress.  HENT:  Nose: Nose normal.  Mouth/Throat: Oropharynx is clear and moist.  Neck: Neck supple. No thyromegaly present.  Cardiovascular: Normal rate and regular rhythm.   Pulmonary/Chest: Breath sounds normal. No respiratory distress. She has no wheezes.  Abdominal: Soft. Bowel sounds are normal. There is no tenderness.  Musculoskeletal: She exhibits no edema or tenderness.  Lymphadenopathy:    She has no cervical adenopathy.  Skin: No rash noted. No erythema.  Psychiatric: She has a normal mood and affect. Her behavior is normal.    BP 138/88   Pulse (!) 108   Temp 98.6 F (37 C) (Oral)   Resp 12   Ht 5\' 4"  (1.626 m)   Wt 146 lb 12.8 oz (66.6 kg)   SpO2 95%   BMI 25.20 kg/m  Wt Readings from Last 3 Encounters:  04/02/17 146 lb 12.8  oz (66.6 kg)  12/27/16 146 lb 9.6 oz (66.5 kg)  10/29/16 148 lb 6.4 oz (67.3 kg)     Lab Results  Component Value Date   WBC 9.4 09/24/2016   HGB 13.1 09/24/2016   HCT 39.9 09/24/2016   PLT 372.0 09/24/2016   GLUCOSE 98 01/22/2017   CHOL 213 (H) 02/07/2016   TRIG 109.0 02/07/2016   HDL 122.10 02/07/2016   LDLDIRECT 53.3 10/14/2012   LDLCALC 69 02/07/2016   ALT 6 09/24/2016   AST 13 09/24/2016   NA 136 01/22/2017   K 4.0 01/22/2017   CL 104 01/22/2017   CREATININE 1.61 (H) 01/22/2017   BUN 47 (H) 01/22/2017   CO2 23 01/22/2017   TSH 5.68 (H) 09/24/2016   INR 1.0 12/13/2013    US Renal  Result Date: 01/27/2017 CLINICAL DATA:  Decreased GFR, increasing creatinine, worsening renal function, history hypertension EXAM: RENAL / URINARY TRACT ULTRASOUND COMPLETE COMPARISON:  Ultrasound abdomen 12/20/2013 FINDINGS: Right Kidney: Length: 6.8 cm. Small in size. Cortical thinning. Slightly increased cortical echogenicity. No mass, hydronephrosis or shadowing calcification. Left Kidney: Length: 9.9 cm. Normal cortical thickness. Borderline increased cortical echogenicity. No mass, hydronephrosis or shadowing calcification Bladder: Appears normal for degree of bladder distention. BILATERAL ureteral jets identified. IMPRESSION: RIGHT renal atrophy and medical renal disease changes. No evidence of renal mass or hydronephrosis. Electronically Signed   By: Lavonia Dana M.D.   On: 01/27/2017 17:42       Assessment & Plan:   Problem List Items Addressed This Visit    CKD (chronic kidney disease) stage 3, GFR 30-59 ml/min    Seeing nephrology.  Just had renal artery duplex.  States was negative.  Started on losartan.  Keep f/u with nephrology.        Hypercholesterolemia    Low cholesterol diet and exercise.  Follow lipid panel.        Relevant Medications   losartan (COZAAR) 50 MG tablet   Hypertension    Losartan recently added.  Blood presure on recheck better.  Continue current  medication regimen.  Follow pressures.  Follow metabolic panel. Continue f/u with nephrology.        Relevant Medications   losartan (COZAAR) 50 MG tablet       [name], MD

## 2017-04-04 ENCOUNTER — Encounter: Payer: Self-pay | Admitting: Internal Medicine

## 2017-04-04 NOTE — Assessment & Plan Note (Signed)
Losartan recently added.  Blood presure on recheck better.  Continue current medication regimen.  Follow pressures.  Follow metabolic panel. Continue f/u with nephrology.

## 2017-04-04 NOTE — Assessment & Plan Note (Signed)
Low cholesterol diet and exercise.  Follow lipid panel.   

## 2017-04-04 NOTE — Assessment & Plan Note (Signed)
Seeing nephrology.  Just had renal artery duplex.  States was negative.  Started on losartan.  Keep f/u with nephrology.

## 2017-04-05 ENCOUNTER — Other Ambulatory Visit: Payer: Self-pay | Admitting: Internal Medicine

## 2017-04-20 ENCOUNTER — Other Ambulatory Visit: Payer: Self-pay | Admitting: Internal Medicine

## 2017-05-23 ENCOUNTER — Other Ambulatory Visit: Payer: Self-pay | Admitting: Internal Medicine

## 2017-10-05 ENCOUNTER — Other Ambulatory Visit: Payer: Self-pay | Admitting: Internal Medicine

## 2017-10-07 NOTE — Telephone Encounter (Signed)
Has not been seen since 03/2017. Ok to fill?

## 2017-10-08 NOTE — Telephone Encounter (Signed)
Sent in rx for effexor.  Needs appt scheduled.  Needs to keep next appt for more refills.

## 2017-10-10 NOTE — Telephone Encounter (Signed)
Can you call and schedule?

## 2017-10-16 ENCOUNTER — Other Ambulatory Visit: Payer: Self-pay | Admitting: Internal Medicine

## 2017-11-03 ENCOUNTER — Other Ambulatory Visit: Payer: Self-pay | Admitting: Internal Medicine

## 2017-11-03 DIAGNOSIS — Z1231 Encounter for screening mammogram for malignant neoplasm of breast: Secondary | ICD-10-CM

## 2017-11-25 ENCOUNTER — Other Ambulatory Visit: Payer: Self-pay | Admitting: Internal Medicine

## 2017-11-26 ENCOUNTER — Other Ambulatory Visit: Payer: Self-pay | Admitting: Internal Medicine

## 2017-12-27 ENCOUNTER — Other Ambulatory Visit: Payer: Self-pay | Admitting: Internal Medicine

## 2017-12-30 ENCOUNTER — Other Ambulatory Visit: Payer: Self-pay | Admitting: Internal Medicine

## 2018-01-15 ENCOUNTER — Telehealth: Payer: Self-pay

## 2018-01-15 NOTE — Telephone Encounter (Signed)
Copied from [place] [phone]. Topic: Appointment Scheduling - Scheduling Inquiry for Clinic >> Jan 15, 2018  1:50 PM Conception Chancy, NT wrote: Reason for CRM: patient is needing a appt with Dr.Scott for medication refill. Her schedule is booked out until the end of September. Please contact pt to schedule appt. Thanks.

## 2018-01-15 NOTE — Telephone Encounter (Signed)
appt scheduled

## 2018-01-28 ENCOUNTER — Telehealth: Payer: Self-pay | Admitting: Internal Medicine

## 2018-01-28 ENCOUNTER — Other Ambulatory Visit: Payer: Self-pay | Admitting: Internal Medicine

## 2018-01-28 NOTE — Telephone Encounter (Signed)
Copied from [place] [phone]. Topic: Quick Communication - See Telephone Encounter >> Jan 28, 2018  3:27 PM Conception Chancy, NT wrote: CRM for notification. See Telephone encounter for: 01/28/18.  Patient is calling and requesting a refill on buPROPion (WELLBUTRIN XL) 150 MG 24 hr tablet. Please advise.   [id number] [address], [place] [address] Phone: [phone] Fax: [phone]

## 2018-01-29 ENCOUNTER — Other Ambulatory Visit: Payer: Self-pay | Admitting: *Deleted

## 2018-01-29 NOTE — Telephone Encounter (Signed)
Medication is pended for provider review

## 2018-02-03 ENCOUNTER — Telehealth: Payer: Self-pay | Admitting: Internal Medicine

## 2018-02-03 NOTE — Telephone Encounter (Signed)
Is there something we can send in or advise appt/acute care?

## 2018-02-03 NOTE — Telephone Encounter (Signed)
She needs to be seen.

## 2018-02-03 NOTE — Telephone Encounter (Signed)
Pt called to be contacted by pcp or nurse b/c pt cant walk due to gout, call pt to advise

## 2018-02-03 NOTE — Telephone Encounter (Signed)
Please advise I don't see where she has been prescribed anything for gout before. But she did have a uric acid done in 2016.

## 2018-02-03 NOTE — Telephone Encounter (Signed)
Copied from [place] [phone]. Topic: Quick Communication - See Telephone Encounter >> Feb 03, 2018 10:05 AM Bea Graff, NT wrote: CRM for notification. See Telephone encounter for: 02/03/18. Pt would like to see if Dr. [name] can call something in for gout? She states she is unable to walk due to the pain to be able to come in for an appt. [id number] - [place], [place] [phone] (Phone) [phone] (Fax)

## 2018-02-04 ENCOUNTER — Encounter: Payer: Self-pay | Admitting: Internal Medicine

## 2018-02-04 NOTE — Telephone Encounter (Signed)
Pt scheduled with margaret at 1:15 on 6/14

## 2018-02-05 ENCOUNTER — Ambulatory Visit: Payer: BC Managed Care – PPO | Admitting: Family

## 2018-02-06 ENCOUNTER — Ambulatory Visit: Payer: BC Managed Care – PPO | Admitting: Family

## 2018-02-23 ENCOUNTER — Telehealth: Payer: Self-pay

## 2018-02-23 NOTE — Telephone Encounter (Signed)
Called and scheduled pt

## 2018-02-23 NOTE — Telephone Encounter (Signed)
Copied from [place] [phone]. Topic: Appointment Scheduling - Scheduling Inquiry for Clinic >> Feb 23, 2018  1:25 PM Gardiner Ramus wrote: Reason for CRM: PT mother passed away and needed to reschedule with charlene scott. Pt appt was 02/27/18. Pt prefers early as possible. Please advise Cb# [phone]. Please leave detailed message

## 2018-02-27 ENCOUNTER — Ambulatory Visit: Payer: BC Managed Care – PPO | Admitting: Internal Medicine

## 2018-02-28 ENCOUNTER — Other Ambulatory Visit: Payer: Self-pay | Admitting: Internal Medicine

## 2018-03-02 NOTE — Telephone Encounter (Signed)
No OV since 04/02/17 ok to fill?

## 2018-03-02 NOTE — Telephone Encounter (Signed)
ok'd refill x 1.  Needs to keep 03/23/18 appt.

## 2018-03-23 ENCOUNTER — Ambulatory Visit: Payer: BC Managed Care – PPO | Admitting: Internal Medicine

## 2018-03-29 ENCOUNTER — Other Ambulatory Visit: Payer: Self-pay | Admitting: Internal Medicine

## 2018-03-30 ENCOUNTER — Other Ambulatory Visit: Payer: Self-pay | Admitting: Internal Medicine

## 2018-03-30 NOTE — Telephone Encounter (Signed)
Patient does not have appt until 06/2018 and has not been seen since 03/2017. Patient has cancelled multiple appts. OK to fill?

## 2018-03-31 NOTE — Telephone Encounter (Signed)
It appears that she had app 02/27/18 and 03/23/18 and cancelled.  This was after she was notified could not refill if did not keep appt.  (she has missed multiple appts).  Needs to schedule earlier appt and cannot refill until appt scheduled and she can only have enough to get through to appt.  She needs to be notified will not refill if does not make and keep earlier appt.

## 2018-04-01 ENCOUNTER — Other Ambulatory Visit: Payer: Self-pay | Admitting: Internal Medicine

## 2018-04-03 ENCOUNTER — Telehealth: Payer: Self-pay | Admitting: Internal Medicine

## 2018-04-03 ENCOUNTER — Other Ambulatory Visit: Payer: Self-pay

## 2018-04-03 ENCOUNTER — Other Ambulatory Visit: Payer: Self-pay | Admitting: Internal Medicine

## 2018-04-03 MED ORDER — VENLAFAXINE HCL ER 150 MG PO CP24: 150.0000 mg | ORAL_CAPSULE | Freq: Every day | ORAL | 0 refills | Status: DC

## 2018-04-03 MED ORDER — BUPROPION HCL ER (XL) 150 MG PO TB24: ORAL_TABLET | ORAL | 0 refills | Status: DC

## 2018-04-03 NOTE — Telephone Encounter (Signed)
Copied from [place] [id number]. Topic: Quick Communication - Rx Refill/Question >> Apr 03, 2018 12:23 PM Gardiner Ramus wrote: Medication:buPROPion (WELLBUTRIN XL) 150 MG 24 hr tablet [[id number]] venlafaxine XR (EFFEXOR-XR) 150 MG 24 hr capsule [[id number]] [id number] [place], [place] - [place] [phone] (Phone) [phone] (Fax) pt has an appointment on 07/02/18  Has the patient contacted their pharmacy? Yes Preferred Pharmacy (with phone number or street name): [id number] [place], [place] [phone] (Phone) [phone] (Fax)  Agent: Please be advised that RX refills may take up to 3 business days. We ask that you follow-up with your pharmacy.

## 2018-04-09 NOTE — Telephone Encounter (Signed)
Called patient and offered her 04/24/18 11:30. Patient stated she would have to check her husbands work schedule and get back in touch with me. Patient is aware that she cannot get anymore refills with out coming to appt.

## 2018-05-01 ENCOUNTER — Other Ambulatory Visit: Payer: Self-pay | Admitting: Internal Medicine

## 2018-05-27 ENCOUNTER — Other Ambulatory Visit: Payer: Self-pay | Admitting: Internal Medicine

## 2018-06-26 ENCOUNTER — Other Ambulatory Visit: Payer: Self-pay | Admitting: Internal Medicine

## 2018-06-27 ENCOUNTER — Other Ambulatory Visit: Payer: Self-pay | Admitting: Internal Medicine

## 2018-07-02 ENCOUNTER — Other Ambulatory Visit: Payer: Self-pay | Admitting: Internal Medicine

## 2018-07-02 ENCOUNTER — Other Ambulatory Visit
Admission: RE | Admit: 2018-07-02 | Discharge: 2018-07-02 | Disposition: A | Discharge status: Home / Self Care | Payer: BC Managed Care – PPO | Source: Ambulatory Visit | Attending: Internal Medicine | Admitting: Internal Medicine

## 2018-07-02 ENCOUNTER — Encounter: Payer: Self-pay | Admitting: Internal Medicine

## 2018-07-02 ENCOUNTER — Ambulatory Visit (INDEPENDENT_AMBULATORY_CARE_PROVIDER_SITE_OTHER): Payer: BC Managed Care – PPO | Admitting: Internal Medicine

## 2018-07-02 VITALS — BP 138/88 | HR 106 | Temp 98.6°F | Resp 18 | Wt 148.0 lb

## 2018-07-02 DIAGNOSIS — F329 Major depressive disorder, single episode, unspecified: Secondary | ICD-10-CM

## 2018-07-02 DIAGNOSIS — Z720 Tobacco use: Secondary | ICD-10-CM

## 2018-07-02 DIAGNOSIS — I10 Essential (primary) hypertension: Secondary | ICD-10-CM

## 2018-07-02 DIAGNOSIS — Z23 Encounter for immunization: Secondary | ICD-10-CM

## 2018-07-02 DIAGNOSIS — E7800 Pure hypercholesterolemia, unspecified: Secondary | ICD-10-CM

## 2018-07-02 DIAGNOSIS — N183 Chronic kidney disease, stage 3 unspecified: Secondary | ICD-10-CM

## 2018-07-02 DIAGNOSIS — E538 Deficiency of other specified B group vitamins: Secondary | ICD-10-CM

## 2018-07-02 DIAGNOSIS — Z1231 Encounter for screening mammogram for malignant neoplasm of breast: Secondary | ICD-10-CM

## 2018-07-02 DIAGNOSIS — E875 Hyperkalemia: Secondary | ICD-10-CM

## 2018-07-02 DIAGNOSIS — F32A Depression, unspecified: Secondary | ICD-10-CM

## 2018-07-02 LAB — HEPATIC FUNCTION PANEL
ALT: 15 U/L (ref 0–35)
AST: 28 U/L (ref 0–37)
Albumin: 4.3 g/dL (ref 3.5–5.2)
Alkaline Phosphatase: 95 U/L (ref 39–117)
BILIRUBIN TOTAL: 0.5 mg/dL (ref 0.2–1.2)
Bilirubin, Direct: 0.1 mg/dL (ref 0.0–0.3)
TOTAL PROTEIN: 7.2 g/dL (ref 6.0–8.3)

## 2018-07-02 LAB — CBC WITH DIFFERENTIAL/PLATELET
BASOS PCT: 1.3 % (ref 0.0–3.0)
Basophils Absolute: 0.1 10*3/uL (ref 0.0–0.1)
Eosinophils Absolute: 0.2 10*3/uL (ref 0.0–0.7)
Eosinophils Relative: 2.1 % (ref 0.0–5.0)
HCT: 40.7 % (ref 36.0–46.0)
HEMOGLOBIN: 13.5 g/dL (ref 12.0–15.0)
LYMPHS ABS: 1.4 10*3/uL (ref 0.7–4.0)
Lymphocytes Relative: 15.5 % (ref 12.0–46.0)
MCHC: 33.2 g/dL (ref 30.0–36.0)
MCV: 98.9 fl (ref 78.0–100.0)
MONO ABS: 0.7 10*3/uL (ref 0.1–1.0)
Monocytes Relative: 7.6 % (ref 3.0–12.0)
Neutro Abs: 6.5 10*3/uL (ref 1.4–7.7)
Neutrophils Relative %: 73.5 % (ref 43.0–77.0)
Platelets: 263 10*3/uL (ref 150.0–400.0)
RBC: 4.12 Mil/uL (ref 3.87–5.11)
RDW: 14.1 % (ref 11.5–15.5)
WBC: 8.9 10*3/uL (ref 4.0–10.5)

## 2018-07-02 LAB — LIPID PANEL
CHOLESTEROL: 207 mg/dL — AB (ref 0–200)
HDL: 113.2 mg/dL (ref >39.00)
LDL CALC: 67 mg/dL (ref 0–99)
NonHDL: 93.71
Total CHOL/HDL Ratio: 2
Triglycerides: 133 mg/dL (ref 0.0–149.0)
VLDL: 26.6 mg/dL (ref 0.0–40.0)

## 2018-07-02 LAB — BASIC METABOLIC PANEL
BUN: 32 mg/dL — ABNORMAL HIGH (ref 6–23)
CO2: 27 meq/L (ref 19–32)
Calcium: 9.4 mg/dL (ref 8.4–10.5)
Chloride: 103 mEq/L (ref 96–112)
Creatinine, Ser: 1.29 mg/dL — ABNORMAL HIGH (ref 0.40–1.20)
GFR: 44.35 mL/min — ABNORMAL LOW (ref >60.00)
GLUCOSE: 91 mg/dL (ref 70–99)
POTASSIUM: 6 meq/L — AB (ref 3.5–5.1)
SODIUM: 137 meq/L (ref 135–145)

## 2018-07-02 LAB — VITAMIN B12: Vitamin B-12: 191 pg/mL — ABNORMAL LOW (ref 211–911)

## 2018-07-02 LAB — TSH: TSH: 3.69 u[IU]/mL (ref 0.35–4.50)

## 2018-07-02 LAB — POTASSIUM: Potassium: 4.7 mmol/L (ref 3.5–5.1)

## 2018-07-02 MED ORDER — AMLODIPINE BESYLATE 5 MG PO TABS: 5.0000 mg | ORAL_TABLET | Freq: Every day | ORAL | 3 refills | Status: DC

## 2018-07-02 NOTE — Progress Notes (Signed)
Patient ID: Dawn Sherman, female   DOB: June 02, 1956, 62 y.o.   MRN: [id number]   Subjective:    Patient ID: Dawn Sherman, female    DOB: 19-Jan-1956, 62 y.o.   MRN: [id number]  HPI  Patient here for a scheduled follow up.  Have not seen her in over one year.  She has lost her home.  Has filed bankruptcy.  Discussed with her today.  She is living with her mother-n-law.  Some increased stress related.  Continues on wellbutrin and effexor.  Discussed counseling.  Overall she feels she is doing well.  Does not feel needs anything more.  Staying active.  No chest pain.  No sob.  No acid reflux.  No abdominal pain.  Bowels moving.  Is still smoking.  Discussed the need to quit.  She declines.  Discussed screening CT.  She declines at this time. States she will notify me if she changes her mind.  Off her blood pressure medication.  Stopped taking.  No problems with the medication.     Past Medical History:  Diagnosis Date  . Depression   . Endometriosis   . Hypertension    Past Surgical History:  Procedure Laterality Date  . ABDOMINAL HYSTERECTOMY    . BREAST BIOPSY Right 2015   neg  . TAH/RSO  1999   secondary to bleeding and endometriosis (Dr Vernie Ammons)   Family History  Problem Relation Age of Onset  . Hypercholesterolemia Mother   . Breast cancer Neg Hx   . Colon cancer Neg Hx    Social History   Socioeconomic History  . Marital status: Married    Spouse name: Not on file  . Number of children: Not on file  . Years of education: Not on file  . Highest education level: Not on file  Occupational History  . Not on file  Social Needs  . Financial resource strain: Not on file  . Food insecurity:    Worry: Not on file    Inability: Not on file  . Transportation needs:    Medical: Not on file    Non-medical: Not on file  Tobacco Use  . Smoking status: Current Every Day Smoker    Packs/day: 1.00    Years: 20.00    Pack years: 20.00    Types: Cigarettes  . Smokeless tobacco:  Never Used  Substance and Sexual Activity  . Alcohol use: Yes    Alcohol/week: 0.0 standard drinks    Comment: occasional  . Drug use: No  . Sexual activity: Not on file  Lifestyle  . Physical activity:    Days per week: Not on file    Minutes per session: Not on file  . Stress: Not on file  Relationships  . Social connections:    Talks on phone: Not on file    Gets together: Not on file    Attends religious service: Not on file    Active member of club or organization: Not on file    Attends meetings of clubs or organizations: Not on file    Relationship status: Not on file  Other Topics Concern  . Not on file  Social History Narrative  . Not on file    Outpatient Encounter Medications as of 07/02/2018  Medication Sig  . buPROPion (WELLBUTRIN XL) 150 MG 24 hr tablet TAKE 1 TABLET (150 MG TOTAL) BY MOUTH DAILY. APPOINTMENT NEEDED FOR 90 DAYS SUPPLY.  Marland Kitchen venlafaxine XR (EFFEXOR-XR) 150 MG 24 hr capsule TAKE  1 CAPSULE BY MOUTH EVERY DAY  . amLODipine (NORVASC) 5 MG tablet Take 1 tablet (5 mg total) by mouth daily.  . [DISCONTINUED] amLODipine (NORVASC) 10 MG tablet TAKE 1 TABLET (10 MG TOTAL) BY MOUTH DAILY.  . [DISCONTINUED] cetirizine (ZYRTEC) 10 MG tablet TAKE 1 TABLET BY MOUTH EVERY MORNING  . [DISCONTINUED] hydrochlorothiazide (HYDRODIURIL) 25 MG tablet Take 1 tablet (25 mg total) by mouth daily.  . [DISCONTINUED] hydrochlorothiazide (HYDRODIURIL) 25 MG tablet TAKE 1 TABLET (25 MG TOTAL) BY MOUTH DAILY.  . [DISCONTINUED] losartan (COZAAR) 50 MG tablet    No facility-administered encounter medications on file as of 07/02/2018.     Review of Systems  Constitutional: Negative for appetite change and unexpected weight change.  HENT: Negative for congestion and sinus pressure.   Respiratory: Negative for cough, chest tightness and shortness of breath.   Cardiovascular: Negative for chest pain, palpitations and leg swelling.  Gastrointestinal: Negative for abdominal pain,  diarrhea, nausea and vomiting.  Genitourinary: Negative for difficulty urinating and dysuria.  Musculoskeletal: Negative for joint swelling and myalgias.  Skin: Negative for color change and rash.  Neurological: Negative for dizziness, light-headedness and headaches.  Psychiatric/Behavioral: Negative for agitation and dysphoric mood.       Increased stress as outlined.         Objective:    Physical Exam  Constitutional: She appears well-developed and well-nourished. No distress.  HENT:  Nose: Nose normal.  Mouth/Throat: Oropharynx is clear and moist.  Neck: Neck supple. No thyromegaly present.  Cardiovascular: Normal rate and regular rhythm.  Pulmonary/Chest: Breath sounds normal. No respiratory distress. She has no wheezes.  Abdominal: Soft. Bowel sounds are normal. There is no tenderness.  Musculoskeletal: She exhibits no edema or tenderness.  Lymphadenopathy:    She has no cervical adenopathy.  Skin: No rash noted. No erythema.  Psychiatric: She has a normal mood and affect. Her behavior is normal.    BP 138/88 (BP Location: Left Arm, Patient Position: Sitting, Cuff Size: Normal)   Pulse (!) 106   Temp 98.6 F (37 C) (Oral)   Resp 18   Wt 148 lb (67.1 kg)   SpO2 97%   BMI 25.40 kg/m  Wt Readings from Last 3 Encounters:  07/02/18 148 lb (67.1 kg)  04/02/17 146 lb 12.8 oz (66.6 kg)  12/27/16 146 lb 9.6 oz (66.5 kg)     Lab Results  Component Value Date   WBC 8.9 07/02/2018   HGB 13.5 07/02/2018   HCT 40.7 07/02/2018   PLT 263.0 07/02/2018   GLUCOSE 91 07/02/2018   CHOL 207 (H) 07/02/2018   TRIG 133.0 07/02/2018   HDL 113.20 07/02/2018   LDLDIRECT 53.3 10/14/2012   LDLCALC 67 07/02/2018   ALT 15 07/02/2018   AST 28 07/02/2018   NA 137 07/02/2018   K 4.7 07/02/2018   CL 103 07/02/2018   CREATININE 1.29 (H) 07/02/2018   BUN 32 (H) 07/02/2018   CO2 27 07/02/2018   TSH 3.69 07/02/2018   INR 1.0 12/13/2013    US Renal  Result Date: 01/27/2017 CLINICAL  DATA:  Decreased GFR, increasing creatinine, worsening renal function, history hypertension EXAM: RENAL / URINARY TRACT ULTRASOUND COMPLETE COMPARISON:  Ultrasound abdomen 12/20/2013 FINDINGS: Right Kidney: Length: 6.8 cm. Small in size. Cortical thinning. Slightly increased cortical echogenicity. No mass, hydronephrosis or shadowing calcification. Left Kidney: Length: 9.9 cm. Normal cortical thickness. Borderline increased cortical echogenicity. No mass, hydronephrosis or shadowing calcification Bladder: Appears normal for degree of bladder distention. BILATERAL  ureteral jets identified. IMPRESSION: RIGHT renal atrophy and medical renal disease changes. No evidence of renal mass or hydronephrosis. Electronically Signed   By: Lavonia Dana M.D.   On: 01/27/2017 17:42       Assessment & Plan:   Problem List Items Addressed This Visit    CKD (chronic kidney disease) stage 3, GFR 30-59 ml/min ([place])    Has seen nephrology.  Renal artery duplex - negative.  Off blood pressure medication.  Restart amlodipine.  Check metabolic panel.        Depression    Increased stress recently as outlined.  On wellbutrin and effexor.  Overall feels she is handling things relatively well.  Follow.        Hypercholesterolemia    Low cholesterol diet ane exercise.  Follow lipid panel.        Relevant Medications   amLODipine (NORVASC) 5 MG tablet   Other Relevant Orders   Hepatic function panel (Completed)   Lipid panel (Completed)   Hypertension    Blood pressure elevated.  Restart amlodipine 5mg  q day.  Have her spot check her pressures.  Check metabolic panel.  Get her back in soon to reassess.        Relevant Medications   amLODipine (NORVASC) 5 MG tablet   Other Relevant Orders   CBC with Differential/Platelet (Completed)   TSH (Completed)   Basic metabolic panel (Completed)   Tobacco abuse    Discussed with her today.  Discussed the need to quit smoking.  She declines.  Discussed screening CT chest.   She declines.  Will notify me if she changes her mind.         Other Visit Diagnoses    Visit for screening mammogram    -  Primary   Relevant Orders   MM 3D SCREEN BREAST BILATERAL   B12 deficiency       Relevant Orders   Vitamin B12 (Completed)   Need for immunization against influenza       Relevant Orders   Flu Vaccine QUAD 36+ mos IM (Completed)       [name], MD

## 2018-07-02 NOTE — Progress Notes (Signed)
Order placed for f/u stat potassium.  

## 2018-07-05 ENCOUNTER — Encounter: Payer: Self-pay | Admitting: Internal Medicine

## 2018-07-05 NOTE — Assessment & Plan Note (Signed)
Low cholesterol diet ane exercise.  Follow lipid panel.

## 2018-07-05 NOTE — Assessment & Plan Note (Signed)
Blood pressure elevated.  Restart amlodipine 5mg  q day.  Have her spot check her pressures.  Check metabolic panel.  Get her back in soon to reassess.

## 2018-07-05 NOTE — Assessment & Plan Note (Signed)
Has seen nephrology.  Renal artery duplex - negative.  Off blood pressure medication.  Restart amlodipine.  Check metabolic panel.

## 2018-07-05 NOTE — Assessment & Plan Note (Signed)
Increased stress recently as outlined.  On wellbutrin and effexor.  Overall feels she is handling things relatively well.  Follow.

## 2018-07-05 NOTE — Assessment & Plan Note (Signed)
Discussed with her today.  Discussed the need to quit smoking.  She declines.  Discussed screening CT chest.  She declines.  Will notify me if she changes her mind.

## 2018-07-09 ENCOUNTER — Ambulatory Visit: Payer: BC Managed Care – PPO

## 2018-07-16 ENCOUNTER — Ambulatory Visit: Payer: BC Managed Care – PPO

## 2018-07-17 ENCOUNTER — Ambulatory Visit (INDEPENDENT_AMBULATORY_CARE_PROVIDER_SITE_OTHER): Payer: BC Managed Care – PPO | Admitting: Internal Medicine

## 2018-07-17 DIAGNOSIS — E538 Deficiency of other specified B group vitamins: Secondary | ICD-10-CM

## 2018-07-17 MED ORDER — CYANOCOBALAMIN 1000 MCG/ML IJ SOLN
1000.0000 ug | Freq: Once | INTRAMUSCULAR | Status: AC
  Administered 2018-07-17: 1000 ug via INTRAMUSCULAR

## 2018-07-17 NOTE — Progress Notes (Signed)
Patient came in today for B12 shot per Dr. [name]. Pt received injection in right deltoid and patient tolerated well.   Reviewed.  Dr [name]

## 2018-07-18 ENCOUNTER — Encounter: Payer: Self-pay | Admitting: Internal Medicine

## 2018-07-25 ENCOUNTER — Other Ambulatory Visit: Payer: Self-pay | Admitting: Internal Medicine

## 2018-07-26 ENCOUNTER — Other Ambulatory Visit: Payer: Self-pay | Admitting: Internal Medicine

## 2018-07-28 ENCOUNTER — Ambulatory Visit: Payer: Self-pay

## 2018-07-28 NOTE — Telephone Encounter (Signed)
Can work in tomorrow - per our discussion.

## 2018-07-28 NOTE — Telephone Encounter (Signed)
Per Dr. [name] patient scheduled for 07/29/18 at 1:30

## 2018-07-28 NOTE — Telephone Encounter (Signed)
Coming to speak with you concerning patient.
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
## 2018-07-29 ENCOUNTER — Ambulatory Visit: Payer: BC Managed Care – PPO | Admitting: Internal Medicine

## 2018-07-29 DIAGNOSIS — F329 Major depressive disorder, single episode, unspecified: Secondary | ICD-10-CM | POA: Diagnosis not present

## 2018-07-29 DIAGNOSIS — F1010 Alcohol abuse, uncomplicated: Secondary | ICD-10-CM

## 2018-07-29 DIAGNOSIS — I10 Essential (primary) hypertension: Secondary | ICD-10-CM

## 2018-07-29 DIAGNOSIS — E538 Deficiency of other specified B group vitamins: Secondary | ICD-10-CM | POA: Diagnosis not present

## 2018-07-29 DIAGNOSIS — F32A Depression, unspecified: Secondary | ICD-10-CM

## 2018-07-29 MED ORDER — CYANOCOBALAMIN 1000 MCG/ML IJ SOLN
1000.0000 ug | Freq: Once | INTRAMUSCULAR | Status: AC
  Administered 2018-07-29: 1000 ug via INTRAMUSCULAR
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
## 2018-07-31 ENCOUNTER — Encounter: Payer: Self-pay | Admitting: Internal Medicine

## 2018-07-31 ENCOUNTER — Other Ambulatory Visit: Payer: Self-pay | Admitting: Internal Medicine

## 2018-07-31 DIAGNOSIS — F1010 Alcohol abuse, uncomplicated: Secondary | ICD-10-CM | POA: Insufficient documentation

## 2018-07-31 DIAGNOSIS — E538 Deficiency of other specified B group vitamins: Secondary | ICD-10-CM | POA: Insufficient documentation

## 2018-07-31 DIAGNOSIS — F1011 Alcohol abuse, in remission: Secondary | ICD-10-CM | POA: Insufficient documentation

## 2018-07-31 NOTE — Assessment & Plan Note (Signed)
Discussed with her and her husband today.  She is agreeable to get help.  Wants to stop drinking.  Has not had any alcohol in over 48 hours.  Discussed counseling.  Discussed psychiatry referral as outlined.  She is in agreement.  Also gave her information regarding AA meetings. She is agreeable to attend.  Has good family support.

## 2018-07-31 NOTE — Assessment & Plan Note (Signed)
Blood pressure under reasonable control.  Continue same medication.  Follow pressures.  Follow metabolic panel.

## 2018-07-31 NOTE — Assessment & Plan Note (Signed)
B12 injection given today. 

## 2018-07-31 NOTE — Assessment & Plan Note (Signed)
Increased stress as outlined.  Also see last note. Discussed with her today.  Discussed her increased alcohol intake.  Information given regarding AA meetings.  She is agreeable to attend.  Also discussed psychiatry referral. She is in agreement.

## 2018-08-05 ENCOUNTER — Ambulatory Visit: Payer: BC Managed Care – PPO

## 2018-08-12 ENCOUNTER — Ambulatory Visit (INDEPENDENT_AMBULATORY_CARE_PROVIDER_SITE_OTHER): Payer: BC Managed Care – PPO | Admitting: *Deleted

## 2018-08-12 DIAGNOSIS — E538 Deficiency of other specified B group vitamins: Secondary | ICD-10-CM

## 2018-08-12 MED ORDER — CYANOCOBALAMIN 1000 MCG/ML IJ SOLN
1000.0000 ug | Freq: Once | INTRAMUSCULAR | Status: AC
  Administered 2018-08-12: 1000 ug via INTRAMUSCULAR

## 2018-08-12 NOTE — Progress Notes (Signed)
Patient presented for B 12 injection to left deltoid, patient voiced no concerns nor showed any signs of distress during injection. 

## 2018-08-13 ENCOUNTER — Ambulatory Visit: Payer: BC Managed Care – PPO | Admitting: Internal Medicine

## 2018-08-20 ENCOUNTER — Ambulatory Visit: Payer: BC Managed Care – PPO

## 2018-08-29 ENCOUNTER — Other Ambulatory Visit: Payer: Self-pay | Admitting: Internal Medicine

## 2018-08-31 ENCOUNTER — Encounter: Payer: Self-pay | Admitting: Internal Medicine

## 2018-09-02 ENCOUNTER — Ambulatory Visit: Payer: BC Managed Care – PPO

## 2018-09-15 ENCOUNTER — Ambulatory Visit: Payer: BC Managed Care – PPO

## 2018-09-23 ENCOUNTER — Other Ambulatory Visit: Payer: Self-pay | Admitting: Internal Medicine

## 2018-09-24 ENCOUNTER — Ambulatory Visit (INDEPENDENT_AMBULATORY_CARE_PROVIDER_SITE_OTHER): Payer: BC Managed Care – PPO

## 2018-09-24 ENCOUNTER — Ambulatory Visit: Payer: BC Managed Care – PPO | Admitting: Psychiatry

## 2018-09-24 ENCOUNTER — Telehealth: Payer: Self-pay | Admitting: Internal Medicine

## 2018-09-24 ENCOUNTER — Other Ambulatory Visit: Payer: Self-pay | Admitting: Internal Medicine

## 2018-09-24 ENCOUNTER — Ambulatory Visit
Admission: RE | Admit: 2018-09-24 | Discharge: 2018-09-24 | Disposition: A | Discharge status: Home / Self Care | Payer: BC Managed Care – PPO | Source: Ambulatory Visit | Attending: Internal Medicine | Admitting: Internal Medicine

## 2018-09-24 DIAGNOSIS — R921 Mammographic calcification found on diagnostic imaging of breast: Secondary | ICD-10-CM

## 2018-09-24 DIAGNOSIS — Z1231 Encounter for screening mammogram for malignant neoplasm of breast: Secondary | ICD-10-CM | POA: Diagnosis present

## 2018-09-24 DIAGNOSIS — R928 Other abnormal and inconclusive findings on diagnostic imaging of breast: Secondary | ICD-10-CM

## 2018-09-24 DIAGNOSIS — E538 Deficiency of other specified B group vitamins: Secondary | ICD-10-CM | POA: Diagnosis not present

## 2018-09-24 MED ORDER — CYANOCOBALAMIN 1000 MCG/ML IJ SOLN
1000.0000 ug | Freq: Once | INTRAMUSCULAR | Status: AC
  Administered 2018-09-24: 1000 ug via INTRAMUSCULAR

## 2018-09-24 NOTE — Telephone Encounter (Signed)
Last OV 07/02/2018   Pt is requesting sleep aid medication has never taken anything before onlymelatonin  and tylenol PM.   This has been going on for the past five day.   If something is able to be refilled she [facility] [place] [facility] Dr. [name]

## 2018-09-24 NOTE — Telephone Encounter (Signed)
Needs appt.  She is on medication to help with stress and anxiety.  I had also referred her to psychiatry.  I think she had an appt scheduled 09/24/18.  She cancelled.  Need to confirm.  See if agreeable for psychiatry appt.

## 2018-09-24 NOTE — Telephone Encounter (Signed)
Pt is requesting a sleep medication. She is still having problems sleeping.

## 2018-09-24 NOTE — Progress Notes (Signed)
Pt was seen today for NV for B-12 shot given in RD pt tolerated well.

## 2018-09-25 ENCOUNTER — Telehealth: Payer: Self-pay | Admitting: *Deleted

## 2018-09-25 ENCOUNTER — Other Ambulatory Visit: Payer: Self-pay | Admitting: Internal Medicine

## 2018-09-25 DIAGNOSIS — R928 Other abnormal and inconclusive findings on diagnostic imaging of breast: Secondary | ICD-10-CM

## 2018-09-25 NOTE — Telephone Encounter (Signed)
Copied from [place] [id number]. Topic: Appointment Scheduling - Scheduling Inquiry for Clinic >> Sep 25, 2018 11:16 AM Ahmed Prima L wrote: Reason for CRM: patient states she told [name] she would call back if the date for her appt on 2/13 would not work. She said she can not do 2/13 but could do 2/17-19 or 2/26 or 2/27 and only in the mornings. Please advise.

## 2018-09-25 NOTE — Progress Notes (Signed)
Order placed for f/u right breast mammogram.

## 2018-09-25 NOTE — Telephone Encounter (Signed)
Patient has been scheduled for 2/19

## 2018-09-25 NOTE — Telephone Encounter (Signed)
Do you have anything on these dates to schedule patient.

## 2018-09-25 NOTE — Telephone Encounter (Signed)
Patient refused psychiatry, but has scheduled with PCP for 10/08/18 at 1:30. Patient wanted to make PCP aware she has not had an alcoholic beverage in 3 months. That she is doing better but cannot sleep.

## 2018-09-25 NOTE — Telephone Encounter (Signed)
I do not mind seeing her, but since she is already on effexor and wellbutrin and still having issues with sleep, I would like for her to see psychiatry to help me with treating her (since multiple medications).

## 2018-09-29 NOTE — Telephone Encounter (Signed)
Patient scheduled appointment to discuss for 10/14/18.FYI

## 2018-10-08 ENCOUNTER — Ambulatory Visit: Payer: BC Managed Care – PPO | Admitting: Internal Medicine

## 2018-10-12 ENCOUNTER — Ambulatory Visit
Admission: RE | Admit: 2018-10-12 | Discharge: 2018-10-12 | Disposition: A | Discharge status: Home / Self Care | Payer: BC Managed Care – PPO | Source: Ambulatory Visit | Attending: Internal Medicine | Admitting: Internal Medicine

## 2018-10-12 DIAGNOSIS — R921 Mammographic calcification found on diagnostic imaging of breast: Secondary | ICD-10-CM

## 2018-10-12 DIAGNOSIS — R928 Other abnormal and inconclusive findings on diagnostic imaging of breast: Secondary | ICD-10-CM

## 2018-10-13 ENCOUNTER — Other Ambulatory Visit: Payer: Self-pay | Admitting: Internal Medicine

## 2018-10-13 DIAGNOSIS — R921 Mammographic calcification found on diagnostic imaging of breast: Secondary | ICD-10-CM

## 2018-10-14 ENCOUNTER — Encounter: Payer: Self-pay | Admitting: Internal Medicine

## 2018-10-14 ENCOUNTER — Ambulatory Visit: Payer: BC Managed Care – PPO | Admitting: Internal Medicine

## 2018-10-14 DIAGNOSIS — F329 Major depressive disorder, single episode, unspecified: Secondary | ICD-10-CM | POA: Diagnosis not present

## 2018-10-14 DIAGNOSIS — E538 Deficiency of other specified B group vitamins: Secondary | ICD-10-CM

## 2018-10-14 DIAGNOSIS — F1010 Alcohol abuse, uncomplicated: Secondary | ICD-10-CM | POA: Diagnosis not present

## 2018-10-14 DIAGNOSIS — N183 Chronic kidney disease, stage 3 unspecified: Secondary | ICD-10-CM

## 2018-10-14 DIAGNOSIS — Z7689 Persons encountering health services in other specified circumstances: Secondary | ICD-10-CM

## 2018-10-14 DIAGNOSIS — E7800 Pure hypercholesterolemia, unspecified: Secondary | ICD-10-CM

## 2018-10-14 DIAGNOSIS — I10 Essential (primary) hypertension: Secondary | ICD-10-CM

## 2018-10-14 DIAGNOSIS — F32A Depression, unspecified: Secondary | ICD-10-CM

## 2018-10-14 DIAGNOSIS — Z720 Tobacco use: Secondary | ICD-10-CM

## 2018-10-14 NOTE — Progress Notes (Signed)
Patient ID: Dawn Sherman, female   DOB: Jul 29, 1956, 63 y.o.   MRN: [id number]   Subjective:    Patient ID: Dawn Sherman, female    DOB: 07-31-1956, 63 y.o.   MRN: [id number]  HPI  Patient here as a work in to discuss her sleep issues.  On questioning her, she has not had any alcohol since a few days before her last visit with me.  Did not see psychiatry.  Is not going to AA.  States she does not need to go.  Feels she is doing well on her own.  Has good family support.  Not drinking.  Feels better.  On questioning her about her sleep, she reports she goes to bed around 8:00pm.  Falls asleep at 8:45 pm.  Sleeps through the night.  She reports having problems falling asleep.  No problems staying asleep.  Discussed not drinking caffeine prior to bed, etc.  Also discussed getting in the bed a little later than 8:00.  Would like to hold on prescription medication since it appears she is sleeping well without.  Discussed melatonin.  Eating well.  No chest pain.  No sob.  No acid reflux.  No abdominal pain.  Bowels moving.     Past Medical History:  Diagnosis Date  . Depression   . Endometriosis   . Hypertension    Past Surgical History:  Procedure Laterality Date  . ABDOMINAL HYSTERECTOMY    . BREAST BIOPSY Right 2015   neg  . TAH/RSO  1999   secondary to bleeding and endometriosis (Dr Vernie Ammons)   Family History  Problem Relation Age of Onset  . Hypercholesterolemia Mother   . Breast cancer Neg Hx   . Colon cancer Neg Hx    Social History   Socioeconomic History  . Marital status: Married    Spouse name: Not on file  . Number of children: Not on file  . Years of education: Not on file  . Highest education level: Not on file  Occupational History  . Not on file  Social Needs  . Financial resource strain: Not on file  . Food insecurity:    Worry: Not on file    Inability: Not on file  . Transportation needs:    Medical: Not on file    Non-medical: Not on file  Tobacco Use    . Smoking status: Current Every Day Smoker    Packs/day: 1.00    Years: 20.00    Pack years: 20.00    Types: Cigarettes  . Smokeless tobacco: Never Used  Substance and Sexual Activity  . Alcohol use: Yes    Alcohol/week: 0.0 standard drinks    Comment: occasional  . Drug use: No  . Sexual activity: Not on file  Lifestyle  . Physical activity:    Days per week: Not on file    Minutes per session: Not on file  . Stress: Not on file  Relationships  . Social connections:    Talks on phone: Not on file    Gets together: Not on file    Attends religious service: Not on file    Active member of club or organization: Not on file    Attends meetings of clubs or organizations: Not on file    Relationship status: Not on file  Other Topics Concern  . Not on file  Social History Narrative  . Not on file    Outpatient Encounter Medications as of 10/14/2018  Medication Sig  .  amLODipine (NORVASC) 5 MG tablet Take 1 tablet (5 mg total) by mouth daily.  Marland Kitchen buPROPion (WELLBUTRIN XL) 150 MG 24 hr tablet TAKE 1 TABLET (150 MG TOTAL) BY MOUTH DAILY. APPOINTMENT NEEDED FOR 90 DAYS SUPPLY.  Marland Kitchen venlafaxine XR (EFFEXOR-XR) 150 MG 24 hr capsule TAKE 1 CAPSULE BY MOUTH EVERY DAY   No facility-administered encounter medications on file as of 10/14/2018.     Review of Systems  Constitutional: Negative for appetite change and unexpected weight change.  HENT: Negative for congestion and sinus pressure.   Respiratory: Negative for cough, chest tightness and shortness of breath.   Cardiovascular: Negative for chest pain, palpitations and leg swelling.  Gastrointestinal: Negative for abdominal pain, diarrhea, nausea and vomiting.  Genitourinary: Negative for difficulty urinating and dysuria.  Musculoskeletal: Negative for joint swelling and myalgias.  Skin: Negative for color change and rash.  Neurological: Negative for dizziness, light-headedness and headaches.  Psychiatric/Behavioral: Negative for  agitation and dysphoric mood.       Objective:    Physical Exam Constitutional:      General: She is not in acute distress.    Appearance: Normal appearance.  HENT:     Nose: Nose normal. No congestion.     Mouth/Throat:     Pharynx: No oropharyngeal exudate or posterior oropharyngeal erythema.  Neck:     Musculoskeletal: Neck supple. No muscular tenderness.     Thyroid: No thyromegaly.  Cardiovascular:     Rate and Rhythm: Normal rate and regular rhythm.  Pulmonary:     Effort: No respiratory distress.     Breath sounds: Normal breath sounds. No wheezing.  Abdominal:     General: Bowel sounds are normal.     Palpations: Abdomen is soft.     Tenderness: There is no abdominal tenderness.  Musculoskeletal:        General: No swelling or tenderness.  Lymphadenopathy:     Cervical: No cervical adenopathy.  Skin:    Findings: No erythema or rash.  Neurological:     Mental Status: She is alert.  Psychiatric:        Mood and Affect: Mood normal.        Behavior: Behavior normal.     BP 120/80   Pulse 95   Temp 98.8 F (37.1 C) (Oral)   Wt 143 lb 9.6 oz (65.1 kg)   SpO2 100%   BMI 24.65 kg/m  Wt Readings from Last 3 Encounters:  10/14/18 143 lb 9.6 oz (65.1 kg)  07/29/18 148 lb 6.4 oz (67.3 kg)  07/02/18 148 lb (67.1 kg)     Lab Results  Component Value Date   WBC 8.9 07/02/2018   HGB 13.5 07/02/2018   HCT 40.7 07/02/2018   PLT 263.0 07/02/2018   GLUCOSE 91 07/02/2018   CHOL 207 (H) 07/02/2018   TRIG 133.0 07/02/2018   HDL 113.20 07/02/2018   LDLDIRECT 53.3 10/14/2012   LDLCALC 67 07/02/2018   ALT 15 07/02/2018   AST 28 07/02/2018   NA 137 07/02/2018   K 4.7 07/02/2018   CL 103 07/02/2018   CREATININE 1.29 (H) 07/02/2018   BUN 32 (H) 07/02/2018   CO2 27 07/02/2018   TSH 3.69 07/02/2018   INR 1.0 12/13/2013    Mm Digital Diagnostic Unilat R  Result Date: 10/12/2018 CLINICAL DATA:  Screening recall for right breast calcifications. EXAM: DIGITAL  DIAGNOSTIC RIGHT MAMMOGRAM WITH CAD COMPARISON:  Previous exam(s). ACR Breast Density Category d: The breast tissue is extremely dense, which  lowers the sensitivity of mammography. FINDINGS: In the upper-outer quadrant of the right breast, there is a 4 cm area of loosely grouped calcifications. A few of these appear to layer on the true lateral view suggesting that this represents milk of calcium. Mammographic images were processed with CAD. IMPRESSION: The probably benign calcifications in the upper-outer quadrant of the right breast are favored to represent milk of calcium. RECOMMENDATION: Six-month follow-up diagnostic right breast mammogram. I have discussed the findings and recommendations with the patient. Results were also provided in writing at the conclusion of the visit. If applicable, a reminder letter will be sent to the patient regarding the next appointment. BI-RADS CATEGORY  3: Probably benign. Electronically Signed   By: Ammie Ferrier M.D.   On: 10/12/2018 09:57       Assessment & Plan:   Problem List Items Addressed This Visit    Alcohol abuse    Has stopped drinking.  Has not had any alcohol since just before her last visit here.  Family supportive.  Follow.        B12 deficiency    Continue b12 injections.        CKD (chronic kidney disease) stage 3, GFR 30-59 ml/min (HCC)    Has seen nephrology.  Renal artery duplex - negative.  Follow pressures.  On amlodipine.        Depression    Doing well on current regimen.  Desired not to follow up with psych.  Found a house to move in.  Doing better.  Follow.        Hypercholesterolemia    Low cholesterol diet and exercise.  Follow lipid panel.       Hypertension    On amlodipine.  Follow pressures.  Follow metabolic panel.       Sleep concern    She is able to stay asleep once she falls asleep.  Sleeps from 9-5.  Discussed not going to be so early and avoiding stimulants late in the evening. Discussed melatonin.  Avoid  prescription sleep meds at this time.  Follow.        Tobacco abuse    Have discussed the need to quit smoking.  Follow.            [name], MD

## 2018-10-17 ENCOUNTER — Encounter: Payer: Self-pay | Admitting: Internal Medicine

## 2018-10-17 DIAGNOSIS — Z7689 Persons encountering health services in other specified circumstances: Secondary | ICD-10-CM | POA: Insufficient documentation

## 2018-10-17 NOTE — Assessment & Plan Note (Signed)
Has seen nephrology.  Renal artery duplex - negative.  Follow pressures.  On amlodipine.

## 2018-10-17 NOTE — Assessment & Plan Note (Signed)
On amlodipine.  Follow pressures.  Follow metabolic panel.

## 2018-10-17 NOTE — Assessment & Plan Note (Signed)
Continue b12 injections.  

## 2018-10-17 NOTE — Assessment & Plan Note (Signed)
Has stopped drinking.  Has not had any alcohol since just before her last visit here.  Family supportive.  Follow.

## 2018-10-17 NOTE — Assessment & Plan Note (Signed)
She is able to stay asleep once she falls asleep.  Sleeps from 9-5.  Discussed not going to be so early and avoiding stimulants late in the evening. Discussed melatonin.  Avoid prescription sleep meds at this time.  Follow.

## 2018-10-17 NOTE — Assessment & Plan Note (Signed)
Low cholesterol diet and exercise.  Follow lipid panel.   

## 2018-10-17 NOTE — Assessment & Plan Note (Signed)
Doing well on current regimen.  Desired not to follow up with psych.  Found a house to move in.  Doing better.  Follow.

## 2018-10-17 NOTE — Assessment & Plan Note (Signed)
Have discussed the need to quit smoking.  Follow.  

## 2018-10-22 ENCOUNTER — Encounter: Payer: BC Managed Care – PPO | Admitting: Internal Medicine

## 2018-10-27 ENCOUNTER — Ambulatory Visit: Payer: BC Managed Care – PPO

## 2018-10-27 ENCOUNTER — Other Ambulatory Visit: Payer: Self-pay | Admitting: Internal Medicine

## 2018-12-21 ENCOUNTER — Encounter: Payer: BC Managed Care – PPO | Admitting: Internal Medicine

## 2018-12-22 ENCOUNTER — Other Ambulatory Visit: Payer: Self-pay | Admitting: Internal Medicine

## 2019-01-26 ENCOUNTER — Other Ambulatory Visit: Payer: Self-pay | Admitting: Internal Medicine

## 2019-03-03 ENCOUNTER — Other Ambulatory Visit (HOSPITAL_COMMUNITY)
Admission: RE | Admit: 2019-03-03 | Discharge: 2019-03-03 | Disposition: A | Discharge status: Home / Self Care | Payer: BC Managed Care – PPO | Source: Ambulatory Visit | Attending: Internal Medicine | Admitting: Internal Medicine

## 2019-03-03 ENCOUNTER — Other Ambulatory Visit: Payer: Self-pay

## 2019-03-03 ENCOUNTER — Ambulatory Visit (INDEPENDENT_AMBULATORY_CARE_PROVIDER_SITE_OTHER): Payer: BC Managed Care – PPO | Admitting: Internal Medicine

## 2019-03-03 ENCOUNTER — Encounter: Payer: Self-pay | Admitting: Internal Medicine

## 2019-03-03 VITALS — BP 130/80 | HR 96 | Temp 97.9°F | Resp 16 | Ht 65.0 in | Wt 131.0 lb

## 2019-03-03 DIAGNOSIS — I10 Essential (primary) hypertension: Secondary | ICD-10-CM | POA: Diagnosis not present

## 2019-03-03 DIAGNOSIS — F32A Depression, unspecified: Secondary | ICD-10-CM

## 2019-03-03 DIAGNOSIS — Z124 Encounter for screening for malignant neoplasm of cervix: Secondary | ICD-10-CM | POA: Insufficient documentation

## 2019-03-03 DIAGNOSIS — N183 Chronic kidney disease, stage 3 unspecified: Secondary | ICD-10-CM

## 2019-03-03 DIAGNOSIS — F329 Major depressive disorder, single episode, unspecified: Secondary | ICD-10-CM

## 2019-03-03 DIAGNOSIS — E538 Deficiency of other specified B group vitamins: Secondary | ICD-10-CM

## 2019-03-03 DIAGNOSIS — E7800 Pure hypercholesterolemia, unspecified: Secondary | ICD-10-CM | POA: Diagnosis not present

## 2019-03-03 DIAGNOSIS — Z0000 Encounter for general adult medical examination without abnormal findings: Secondary | ICD-10-CM

## 2019-03-03 LAB — CBC WITH DIFFERENTIAL/PLATELET
Basophils Absolute: 0.1 10*3/uL (ref 0.0–0.1)
Basophils Relative: 1.1 % (ref 0.0–3.0)
Eosinophils Absolute: 0.2 10*3/uL (ref 0.0–0.7)
Eosinophils Relative: 1.6 % (ref 0.0–5.0)
HCT: 42.3 % (ref 36.0–46.0)
Hemoglobin: 13.9 g/dL (ref 12.0–15.0)
Lymphocytes Relative: 24 % (ref 12.0–46.0)
Lymphs Abs: 2.3 10*3/uL (ref 0.7–4.0)
MCHC: 32.9 g/dL (ref 30.0–36.0)
MCV: 91.8 fl (ref 78.0–100.0)
Monocytes Absolute: 0.8 10*3/uL (ref 0.1–1.0)
Monocytes Relative: 8.1 % (ref 3.0–12.0)
Neutro Abs: 6.3 10*3/uL (ref 1.4–7.7)
Neutrophils Relative %: 65.2 % (ref 43.0–77.0)
Platelets: 245 10*3/uL (ref 150.0–400.0)
RBC: 4.61 Mil/uL (ref 3.87–5.11)
RDW: 15.6 % — ABNORMAL HIGH (ref 11.5–15.5)
WBC: 9.7 10*3/uL (ref 4.0–10.5)

## 2019-03-03 LAB — BASIC METABOLIC PANEL
BUN: 27 mg/dL — ABNORMAL HIGH (ref 6–23)
CO2: 21 mEq/L (ref 19–32)
Calcium: 9.5 mg/dL (ref 8.4–10.5)
Chloride: 106 mEq/L (ref 96–112)
Creatinine, Ser: 1.28 mg/dL — ABNORMAL HIGH (ref 0.40–1.20)
GFR: 42.02 mL/min — ABNORMAL LOW (ref >60.00)
Glucose, Bld: 89 mg/dL (ref 70–99)
Potassium: 4.8 mEq/L (ref 3.5–5.1)
Sodium: 136 mEq/L (ref 135–145)

## 2019-03-03 LAB — LIPID PANEL
Cholesterol: 195 mg/dL (ref 0–200)
HDL: 75.5 mg/dL (ref >39.00)
LDL Cholesterol: 104 mg/dL — ABNORMAL HIGH (ref 0–99)
NonHDL: 119.35
Total CHOL/HDL Ratio: 3
Triglycerides: 76 mg/dL (ref 0.0–149.0)
VLDL: 15.2 mg/dL (ref 0.0–40.0)

## 2019-03-03 LAB — HEPATIC FUNCTION PANEL
ALT: 14 U/L (ref 0–35)
AST: 16 U/L (ref 0–37)
Albumin: 4.7 g/dL (ref 3.5–5.2)
Alkaline Phosphatase: 104 U/L (ref 39–117)
Bilirubin, Direct: 0 mg/dL (ref 0.0–0.3)
Total Bilirubin: 0.3 mg/dL (ref 0.2–1.2)
Total Protein: 7.3 g/dL (ref 6.0–8.3)

## 2019-03-03 LAB — VITAMIN B12: Vitamin B-12: 310 pg/mL (ref 211–911)

## 2019-03-03 LAB — TSH: TSH: 3.21 u[IU]/mL (ref 0.35–4.50)

## 2019-03-03 NOTE — Progress Notes (Signed)
Patient ID: Dawn Sherman, female   DOB: 1955-12-09, 63 y.o.   MRN: [id number]   Subjective:    Patient ID: Dawn Sherman, female    DOB: 1955-09-14, 63 y.o.   MRN: [id number]  HPI  Patient here for her physical exam.  Reports she is doing well.  Feels good.  Has stopped drinking.  Has not had any alcohol since her last visit.  Has adjusted her diet.  Losing weight.  Feels good.  Stays active.  No chest pain.  No sob.  No acid reflux.  No abdominal pain.  Bowels moving.  Blood pressure doing better.     Past Medical History:  Diagnosis Date  . Depression   . Endometriosis   . Hypertension    Past Surgical History:  Procedure Laterality Date  . ABDOMINAL HYSTERECTOMY    . BREAST BIOPSY Right 2015   neg  . TAH/RSO  1999   secondary to bleeding and endometriosis (Dr Vernie Ammons)   Family History  Problem Relation Age of Onset  . Hypercholesterolemia Mother   . Breast cancer Neg Hx   . Colon cancer Neg Hx    Social History   Socioeconomic History  . Marital status: Married    Spouse name: Not on file  . Number of children: Not on file  . Years of education: Not on file  . Highest education level: Not on file  Occupational History  . Not on file  Social Needs  . Financial resource strain: Not on file  . Food insecurity    Worry: Not on file    Inability: Not on file  . Transportation needs    Medical: Not on file    Non-medical: Not on file  Tobacco Use  . Smoking status: Current Every Day Smoker    Packs/day: 1.00    Years: 20.00    Pack years: 20.00    Types: Cigarettes  . Smokeless tobacco: Never Used  Substance and Sexual Activity  . Alcohol use: Yes    Alcohol/week: 0.0 standard drinks    Comment: occasional  . Drug use: No  . Sexual activity: Not on file  Lifestyle  . Physical activity    Days per week: Not on file    Minutes per session: Not on file  . Stress: Not on file  Relationships  . Social Herbalist on phone: Not on file    Gets  together: Not on file    Attends religious service: Not on file    Active member of club or organization: Not on file    Attends meetings of clubs or organizations: Not on file    Relationship status: Not on file  Other Topics Concern  . Not on file  Social History Narrative  . Not on file    Outpatient Encounter Medications as of 03/03/2019  Medication Sig  . amLODipine (NORVASC) 5 MG tablet TAKE 1 TABLET BY MOUTH EVERY DAY  . buPROPion (WELLBUTRIN XL) 150 MG 24 hr tablet TAKE 1 TABLET (150 MG TOTAL) BY MOUTH DAILY. APPOINTMENT NEEDED FOR 90 DAYS SUPPLY.  Marland Kitchen venlafaxine XR (EFFEXOR-XR) 150 MG 24 hr capsule TAKE 1 CAPSULE BY MOUTH EVERY DAY   No facility-administered encounter medications on file as of 03/03/2019.     Review of Systems  Constitutional: Negative for appetite change and unexpected weight change.  HENT: Negative for congestion and sinus pressure.   Eyes: Negative for pain and visual disturbance.  Respiratory: Negative for cough,  chest tightness and shortness of breath.   Cardiovascular: Negative for chest pain, palpitations and leg swelling.  Gastrointestinal: Negative for abdominal pain, diarrhea, nausea and vomiting.  Genitourinary: Negative for difficulty urinating and dysuria.  Musculoskeletal: Negative for joint swelling and myalgias.  Skin: Negative for color change and rash.  Neurological: Negative for dizziness, light-headedness and headaches.  Hematological: Negative for adenopathy. Does not bruise/bleed easily.  Psychiatric/Behavioral: Negative for agitation and dysphoric mood.       Objective:    Physical Exam Constitutional:      General: She is not in acute distress.    Appearance: Normal appearance. She is well-developed.  HENT:     Right Ear: External ear normal.     Left Ear: External ear normal.  Eyes:     General: No scleral icterus.       Right eye: No discharge.        Left eye: No discharge.     Conjunctiva/sclera: Conjunctivae normal.   Neck:     Musculoskeletal: Neck supple. No muscular tenderness.     Thyroid: No thyromegaly.  Cardiovascular:     Rate and Rhythm: Normal rate and regular rhythm.  Pulmonary:     Effort: No tachypnea, accessory muscle usage or respiratory distress.     Breath sounds: Normal breath sounds. No decreased breath sounds or wheezing.  Chest:     Breasts:        Right: No inverted nipple, mass, nipple discharge or tenderness (no axillary adenopathy).        Left: No inverted nipple, mass, nipple discharge or tenderness (no axilarry adenopathy).  Abdominal:     General: Bowel sounds are normal.     Palpations: Abdomen is soft.     Tenderness: There is no abdominal tenderness.  Musculoskeletal:        General: No swelling or tenderness.  Skin:    Findings: No erythema or rash.  Neurological:     Mental Status: She is alert and oriented to person, place, and time.  Psychiatric:        Mood and Affect: Mood normal.        Behavior: Behavior normal.     BP 130/80   Pulse 96   Temp 97.9 F (36.6 C) (Oral)   Resp 16   Ht 5\' 5"  (1.651 m)   Wt 131 lb (59.4 kg)   SpO2 98%   BMI 21.80 kg/m  Wt Readings from Last 3 Encounters:  03/03/19 131 lb (59.4 kg)  10/14/18 143 lb 9.6 oz (65.1 kg)  07/29/18 148 lb 6.4 oz (67.3 kg)     Lab Results  Component Value Date   WBC 9.7 03/03/2019   HGB 13.9 03/03/2019   HCT 42.3 03/03/2019   PLT 245.0 03/03/2019   GLUCOSE 89 03/03/2019   CHOL 195 03/03/2019   TRIG 76.0 03/03/2019   HDL 75.50 03/03/2019   LDLDIRECT 53.3 10/14/2012   LDLCALC 104 (H) 03/03/2019   ALT 14 03/03/2019   AST 16 03/03/2019   NA 136 03/03/2019   K 4.8 03/03/2019   CL 106 03/03/2019   CREATININE 1.28 (H) 03/03/2019   BUN 27 (H) 03/03/2019   CO2 21 03/03/2019   TSH 3.21 03/03/2019   INR 1.0 12/13/2013    Mm Digital Diagnostic Unilat R  Result Date: 10/12/2018 CLINICAL DATA:  Screening recall for right breast calcifications. EXAM: DIGITAL DIAGNOSTIC RIGHT  MAMMOGRAM WITH CAD COMPARISON:  Previous exam(s). ACR Breast Density Category d: The breast tissue  is extremely dense, which lowers the sensitivity of mammography. FINDINGS: In the upper-outer quadrant of the right breast, there is a 4 cm area of loosely grouped calcifications. A few of these appear to layer on the true lateral view suggesting that this represents milk of calcium. Mammographic images were processed with CAD. IMPRESSION: The probably benign calcifications in the upper-outer quadrant of the right breast are favored to represent milk of calcium. RECOMMENDATION: Six-month follow-up diagnostic right breast mammogram. I have discussed the findings and recommendations with the patient. Results were also provided in writing at the conclusion of the visit. If applicable, a reminder letter will be sent to the patient regarding the next appointment. BI-RADS CATEGORY  3: Probably benign. Electronically Signed   By: Ammie Ferrier M.D.   On: 10/12/2018 09:57       Assessment & Plan:   Problem List Items Addressed This Visit    B12 deficiency    Follow B12 level.        Relevant Orders   Vitamin B12 (Completed)   CKD (chronic kidney disease) stage 3, GFR 30-59 ml/min (HCC)    Has seen nephrology.  Renal artery duplex negative.  Follow pressures.  Doing better.  Follow metabolic panel.       Depression    Doing well on current regimen.  Follow.        Health care maintenance    Physical today 03/03/19.  Due f/u right breast mammogram in 03/2019.  Has been ordered.  Schedule.  Colonoscopy 01/05/19 - normal.  Recommended f/u colonoscopy in 10 years.        Hypercholesterolemia    Low cholesterol diet and exercise.  Follow lipid panel.        Relevant Orders   Hepatic function panel (Completed)   Lipid panel (Completed)   Hypertension    Blood pressure doing better.  Continue current medication.  Follow pressures.        Relevant Orders   CBC with Differential/Platelet (Completed)    TSH (Completed)   Basic metabolic panel (Completed)    Other Visit Diagnoses    Cervical cancer screening    -  Primary   Relevant Orders   Cytology - PAP( [facility]) (Completed)       [name], MD

## 2019-03-03 NOTE — Assessment & Plan Note (Signed)
Physical today 03/03/19.  Due f/u right breast mammogram in 03/2019.  Has been ordered.  Schedule.  Colonoscopy 01/05/19 - normal.  Recommended f/u colonoscopy in 10 years.

## 2019-03-05 ENCOUNTER — Encounter: Payer: Self-pay | Admitting: Internal Medicine

## 2019-03-05 LAB — CYTOLOGY - PAP
Diagnosis: NEGATIVE
HPV: NOT DETECTED

## 2019-03-07 ENCOUNTER — Encounter: Payer: Self-pay | Admitting: Internal Medicine

## 2019-03-07 NOTE — Assessment & Plan Note (Signed)
Low cholesterol diet and exercise.  Follow lipid panel.   

## 2019-03-07 NOTE — Assessment & Plan Note (Signed)
Blood pressure doing better.  Continue current medication.  Follow pressures.

## 2019-03-07 NOTE — Assessment & Plan Note (Signed)
Has seen nephrology.  Renal artery duplex negative.  Follow pressures.  Doing better.  Follow metabolic panel.

## 2019-03-07 NOTE — Assessment & Plan Note (Signed)
Follow B12 level.  

## 2019-03-07 NOTE — Assessment & Plan Note (Signed)
Doing well on current regimen.  Follow.   

## 2019-03-20 ENCOUNTER — Other Ambulatory Visit: Payer: Self-pay | Admitting: Internal Medicine

## 2019-04-13 ENCOUNTER — Other Ambulatory Visit: Payer: Self-pay | Admitting: Internal Medicine

## 2019-04-13 ENCOUNTER — Ambulatory Visit
Admission: RE | Admit: 2019-04-13 | Discharge: 2019-04-13 | Disposition: A | Discharge status: Home / Self Care | Payer: BC Managed Care – PPO | Source: Ambulatory Visit | Attending: Internal Medicine | Admitting: Internal Medicine

## 2019-04-13 ENCOUNTER — Other Ambulatory Visit: Payer: Self-pay

## 2019-04-13 DIAGNOSIS — R928 Other abnormal and inconclusive findings on diagnostic imaging of breast: Secondary | ICD-10-CM

## 2019-04-13 DIAGNOSIS — R921 Mammographic calcification found on diagnostic imaging of breast: Secondary | ICD-10-CM | POA: Insufficient documentation

## 2019-04-13 NOTE — Progress Notes (Signed)
Order placed for bilateral diagnostic mammogram with magnification views of right breast.

## 2019-05-04 ENCOUNTER — Other Ambulatory Visit: Payer: Self-pay | Admitting: Internal Medicine

## 2019-05-19 ENCOUNTER — Encounter: Payer: Self-pay | Admitting: Internal Medicine

## 2019-07-13 ENCOUNTER — Encounter: Payer: Self-pay | Admitting: Internal Medicine

## 2019-07-13 ENCOUNTER — Ambulatory Visit (INDEPENDENT_AMBULATORY_CARE_PROVIDER_SITE_OTHER): Payer: BC Managed Care – PPO | Admitting: Internal Medicine

## 2019-07-13 ENCOUNTER — Other Ambulatory Visit: Payer: Self-pay

## 2019-07-13 DIAGNOSIS — N1832 Chronic kidney disease, stage 3b: Secondary | ICD-10-CM | POA: Diagnosis not present

## 2019-07-13 DIAGNOSIS — R928 Other abnormal and inconclusive findings on diagnostic imaging of breast: Secondary | ICD-10-CM

## 2019-07-13 DIAGNOSIS — E538 Deficiency of other specified B group vitamins: Secondary | ICD-10-CM

## 2019-07-13 DIAGNOSIS — F329 Major depressive disorder, single episode, unspecified: Secondary | ICD-10-CM | POA: Diagnosis not present

## 2019-07-13 DIAGNOSIS — F1011 Alcohol abuse, in remission: Secondary | ICD-10-CM | POA: Diagnosis not present

## 2019-07-13 DIAGNOSIS — F32A Depression, unspecified: Secondary | ICD-10-CM

## 2019-07-13 DIAGNOSIS — E7800 Pure hypercholesterolemia, unspecified: Secondary | ICD-10-CM

## 2019-07-13 DIAGNOSIS — I10 Essential (primary) hypertension: Secondary | ICD-10-CM

## 2019-07-13 NOTE — Assessment & Plan Note (Signed)
Doing well on current medication regimen.  Follow.

## 2019-07-13 NOTE — Assessment & Plan Note (Signed)
Has quit drinking.  Doing well. Has good support.  Follow.

## 2019-07-13 NOTE — Assessment & Plan Note (Signed)
Low cholesterol diet and exercise.  Follow lipid panel.   

## 2019-07-13 NOTE — Assessment & Plan Note (Signed)
Had f/u right breast mammogram 03/2019.  Scheduled for f/u bilateral mammogram in 09/2019.

## 2019-07-13 NOTE — Assessment & Plan Note (Signed)
Has seen nephrology.  Renal artery duplex negative.  Blood pressure doing well.  Continue to avoid antiinflammatories.  Follow metabolic panel.

## 2019-07-13 NOTE — Progress Notes (Signed)
Patient ID: Dawn Sherman, female   DOB: 01-Mar-1956, 63 y.o.   MRN: [id number]   Virtual Visit via telephone Note  This visit type was conducted due to national recommendations for restrictions regarding the COVID-19 pandemic (e.g. social distancing).  This format is felt to be most appropriate for this patient at this time.  All issues noted in this document were discussed and addressed.  No physical exam was performed (except for noted visual exam findings with Video Visits).   I connected with [name] by telephone and verified that I am speaking with the correct person using two identifiers. Location patient: home Location provider: work Persons participating in the telephonevisit: patient, provider  I discussed the limitations, risks, security and privacy concerns of performing an evaluation and management service by telephone and the availability of in person appointments.  The patient expressed understanding and agreed to proceed.   Reason for visit: scheduled follow up.   HPI: She reports she is doing well.  Feels good.  Staying active.  No chest pain.  No sob.  No acid reflux reported.  No abdominal pain.  Bowels moving.  Handling stress.  Discussed with her today.  Feels she is doing well on the current medication regimen.  Not drinking.  Has been almost one year.  Has good support.  Taking oral B12.    ROS: See pertinent positives and negatives per HPI.  Past Medical History:  Diagnosis Date  . Depression   . Endometriosis   . Hypertension     Past Surgical History:  Procedure Laterality Date  . ABDOMINAL HYSTERECTOMY    . BREAST BIOPSY Right 2015   neg  . TAH/RSO  1999   secondary to bleeding and endometriosis (Dr Vernie Ammons)    Family History  Problem Relation Age of Onset  . Hypercholesterolemia Mother   . Breast cancer Neg Hx   . Colon cancer Neg Hx     SOCIAL HX: reviewed.   Current Outpatient Medications:  .  amLODipine (NORVASC) 5 MG tablet, TAKE 1  TABLET BY MOUTH EVERY DAY, Disp: 90 tablet, Rfl: 1 .  buPROPion (WELLBUTRIN XL) 150 MG 24 hr tablet, TAKE 1 TABLET (150 MG TOTAL) BY MOUTH DAILY. APPOINTMENT NEEDED FOR 90 DAYS SUPPLY., Disp: 90 tablet, Rfl: 1 .  venlafaxine XR (EFFEXOR-XR) 150 MG 24 hr capsule, TAKE 1 CAPSULE BY MOUTH EVERY DAY, Disp: 90 capsule, Rfl: 1  EXAM:  VITALS per patient if applicable: 99991111  GENERAL: alert.  Sounds to be in no acute distress.  Answering questions appropriately.   PSYCH/NEURO: pleasant and cooperative, no obvious depression or anxiety, speech and thought processing grossly intact  ASSESSMENT AND PLAN:  Discussed the following assessment and plan:  B12 deficiency Taking oral B12 now.  Follow B12 level.    CKD (chronic kidney disease) stage 3, GFR 30-59 ml/min Has seen nephrology.  Renal artery duplex negative.  Blood pressure doing well.  Continue to avoid antiinflammatories.  Follow metabolic panel.    Depression Doing well on current medication regimen.  Follow.   History of alcohol abuse Has quit drinking.  Doing well. Has good support.  Follow.    Hypercholesterolemia Low cholesterol diet and exercise.  Follow lipid panel.   Hypertension Blood pressure under good control.  Continue same medication regimen.  Follow pressures.  Follow metabolic panel.    Abnormal mammogram Had f/u right breast mammogram 03/2019.  Scheduled for f/u bilateral mammogram in 09/2019.      I discussed the  assessment and treatment plan with the patient. The patient was provided an opportunity to ask questions and all were answered. The patient agreed with the plan and demonstrated an understanding of the instructions.   The patient was advised to call back or seek an in-person evaluation if the symptoms worsen or if the condition fails to improve as anticipated.  I provided 14 minutes of non-face-to-face time during this encounter.   [name], MD

## 2019-07-13 NOTE — Assessment & Plan Note (Signed)
Blood pressure under good control.  Continue same medication regimen.  Follow pressures.  Follow metabolic panel.   

## 2019-07-13 NOTE — Assessment & Plan Note (Signed)
Taking oral B12 now.  Follow B12 level.

## 2019-08-06 ENCOUNTER — Other Ambulatory Visit: Payer: Self-pay | Admitting: Internal Medicine

## 2019-09-10 ENCOUNTER — Telehealth: Payer: Self-pay | Admitting: *Deleted

## 2019-09-10 DIAGNOSIS — I10 Essential (primary) hypertension: Secondary | ICD-10-CM

## 2019-09-10 DIAGNOSIS — E7800 Pure hypercholesterolemia, unspecified: Secondary | ICD-10-CM

## 2019-09-10 NOTE — Telephone Encounter (Signed)
Please place future orders for lab appt.  

## 2019-09-11 NOTE — Telephone Encounter (Signed)
Orders placed for labs

## 2019-09-14 ENCOUNTER — Other Ambulatory Visit: Payer: Self-pay | Admitting: Internal Medicine

## 2019-09-14 ENCOUNTER — Other Ambulatory Visit: Payer: Self-pay

## 2019-09-14 ENCOUNTER — Other Ambulatory Visit
Admission: RE | Admit: 2019-09-14 | Discharge: 2019-09-14 | Disposition: A | Discharge status: Home / Self Care | Payer: BC Managed Care – PPO | Source: Ambulatory Visit | Attending: Internal Medicine | Admitting: Internal Medicine

## 2019-09-14 ENCOUNTER — Other Ambulatory Visit (INDEPENDENT_AMBULATORY_CARE_PROVIDER_SITE_OTHER): Payer: BC Managed Care – PPO

## 2019-09-14 DIAGNOSIS — I10 Essential (primary) hypertension: Secondary | ICD-10-CM | POA: Diagnosis not present

## 2019-09-14 DIAGNOSIS — E7800 Pure hypercholesterolemia, unspecified: Secondary | ICD-10-CM

## 2019-09-14 DIAGNOSIS — E875 Hyperkalemia: Secondary | ICD-10-CM | POA: Insufficient documentation

## 2019-09-14 LAB — HEPATIC FUNCTION PANEL
ALT: 14 U/L (ref 0–35)
AST: 19 U/L (ref 0–37)
Albumin: 4.4 g/dL (ref 3.5–5.2)
Alkaline Phosphatase: 66 U/L (ref 39–117)
Bilirubin, Direct: 0.1 mg/dL (ref 0.0–0.3)
Total Bilirubin: 0.3 mg/dL (ref 0.2–1.2)
Total Protein: 7.2 g/dL (ref 6.0–8.3)

## 2019-09-14 LAB — BASIC METABOLIC PANEL
BUN: 14 mg/dL (ref 6–23)
CO2: 25 mEq/L (ref 19–32)
Calcium: 9.6 mg/dL (ref 8.4–10.5)
Chloride: 106 mEq/L (ref 96–112)
Creatinine, Ser: 1.35 mg/dL — ABNORMAL HIGH (ref 0.40–1.20)
GFR: 39.45 mL/min — ABNORMAL LOW (ref >60.00)
Glucose, Bld: 100 mg/dL — ABNORMAL HIGH (ref 70–99)
Potassium: 5.8 mEq/L — ABNORMAL HIGH (ref 3.5–5.1)
Sodium: 136 mEq/L (ref 135–145)

## 2019-09-14 LAB — LIPID PANEL
Cholesterol: 235 mg/dL — ABNORMAL HIGH (ref 0–200)
HDL: 90.9 mg/dL (ref >39.00)
LDL Cholesterol: 132 mg/dL — ABNORMAL HIGH (ref 0–99)
NonHDL: 144.11
Total CHOL/HDL Ratio: 3
Triglycerides: 63 mg/dL (ref 0.0–149.0)
VLDL: 12.6 mg/dL (ref 0.0–40.0)

## 2019-09-14 LAB — POTASSIUM: Potassium: 5.2 mmol/L — ABNORMAL HIGH (ref 3.5–5.1)

## 2019-09-14 NOTE — Progress Notes (Signed)
Order placed for f/u potassium check.  

## 2019-09-17 ENCOUNTER — Other Ambulatory Visit: Payer: Self-pay | Admitting: Internal Medicine

## 2019-09-17 DIAGNOSIS — N1831 Chronic kidney disease, stage 3a: Secondary | ICD-10-CM

## 2019-09-17 DIAGNOSIS — E875 Hyperkalemia: Secondary | ICD-10-CM

## 2019-09-17 NOTE — Progress Notes (Signed)
Order placed for nephrology referral.   °

## 2019-09-19 ENCOUNTER — Other Ambulatory Visit: Payer: Self-pay | Admitting: Internal Medicine

## 2019-10-31 ENCOUNTER — Other Ambulatory Visit: Payer: Self-pay | Admitting: Internal Medicine

## 2019-11-17 ENCOUNTER — Ambulatory Visit: Payer: BC Managed Care – PPO | Admitting: Internal Medicine

## 2019-12-06 ENCOUNTER — Other Ambulatory Visit: Payer: Self-pay

## 2019-12-06 ENCOUNTER — Ambulatory Visit (INDEPENDENT_AMBULATORY_CARE_PROVIDER_SITE_OTHER): Payer: BC Managed Care – PPO | Admitting: Internal Medicine

## 2019-12-06 DIAGNOSIS — E538 Deficiency of other specified B group vitamins: Secondary | ICD-10-CM | POA: Diagnosis not present

## 2019-12-06 DIAGNOSIS — I10 Essential (primary) hypertension: Secondary | ICD-10-CM

## 2019-12-06 DIAGNOSIS — F32A Depression, unspecified: Secondary | ICD-10-CM

## 2019-12-06 DIAGNOSIS — F1011 Alcohol abuse, in remission: Secondary | ICD-10-CM

## 2019-12-06 DIAGNOSIS — F329 Major depressive disorder, single episode, unspecified: Secondary | ICD-10-CM

## 2019-12-06 DIAGNOSIS — N1832 Chronic kidney disease, stage 3b: Secondary | ICD-10-CM | POA: Diagnosis not present

## 2019-12-06 DIAGNOSIS — E7800 Pure hypercholesterolemia, unspecified: Secondary | ICD-10-CM | POA: Diagnosis not present

## 2019-12-06 DIAGNOSIS — R928 Other abnormal and inconclusive findings on diagnostic imaging of breast: Secondary | ICD-10-CM

## 2019-12-06 NOTE — Progress Notes (Signed)
Patient ID: Dawn Sherman, female   DOB: 30-Oct-1955, 64 y.o.   MRN: [id number]   Virtual Visit via telephone Note  This visit type was conducted due to national recommendations for restrictions regarding the COVID-19 pandemic (e.g. social distancing).  This format is felt to be most appropriate for this patient at this time.  All issues noted in this document were discussed and addressed.  No physical exam was performed (except for noted visual exam findings with Video Visits).   I connected with Dawn Sherman by telephone and verified that I am speaking with the correct person using two identifiers. Location patient: home Location provider: work  Persons participating in the telephone visit: patient, provider  The limitations, risks, security and privacy concerns of performing an evaluation and management service by telephone and the availability of in person appointments have been discussed. The patient expressed understanding and agreed to proceed.   Reason for visit: scheduled follow up.    HPI: She reports she is doing relatively well.  Wearing mask.  Using hand sanitizer.  Has not been sick.  No chest pain or sob reported. No cough or congestion.  No abdominal pain.  Some mild allergy symptoms at times, but claritin controls.  Bowels stable.  States this am blood pressure 120's/83.  Scheduled for her mammogram tomorrow.  Has f/u with nephrology 02/24/20.     ROS: See pertinent positives and negatives per HPI.  Past Medical History:  Diagnosis Date  . Depression   . Endometriosis   . Hypertension     Past Surgical History:  Procedure Laterality Date  . ABDOMINAL HYSTERECTOMY    . BREAST BIOPSY Right 2015   neg-  FIBROADENOMA  . TAH/RSO  1999   secondary to bleeding and endometriosis (Dr Vernie Ammons)    Family History  Problem Relation Age of Onset  . Hypercholesterolemia Mother   . Breast cancer Neg Hx   . Colon cancer Neg Hx     SOCIAL HX: reviewed.    Current  Outpatient Medications:  .  amLODipine (NORVASC) 5 MG tablet, Take 1 tablet (5 mg total) by mouth daily., Disp: 90 tablet, Rfl: 1 .  buPROPion (WELLBUTRIN XL) 150 MG 24 hr tablet, One tab po daily, Disp: 90 tablet, Rfl: 1 .  venlafaxine XR (EFFEXOR-XR) 150 MG 24 hr capsule, TAKE 1 CAPSULE BY MOUTH EVERY DAY, Disp: 90 capsule, Rfl: 1  EXAM:  VITALS per patient if applicable: 638V/56  GENERAL: alert.  Sounds to be in no acute distress. Answering questions appropriately.    PSYCH/NEURO: pleasant and cooperative, no obvious depression or anxiety, speech and thought processing grossly intact  ASSESSMENT AND PLAN:  Discussed the following assessment and plan:  Hypercholesterolemia Low cholesterol diet and exercise  Follow lipid panel.   B12 deficiency Oral B12.  Follow B12 level.   Abnormal mammogram Had f/u right breast mammogram 03/2019.  Scheduled for f/u bilateral mammogram tomorrow.    CKD (chronic kidney disease) stage 3, GFR 30-59 ml/min Followed by nephrology.  Has f/u planned 02/2020.  Renal artery duplex negative.  Avoid antiinflammatories.  Follow renal function.   Depression Stable on wellbutrin and effexor.    History of alcohol abuse Has quit drinking.  Doing well.   Hypertension Blood pressure as outlined.  On amlodipine.  Follow pressures.  Follow metabolic panel.    Orders Placed This Encounter  Procedures  . CBC with Differential/Platelet    Standing Status:   Future    Standing Expiration Date:  12/11/2020  . Hepatic function panel    Standing Status:   Future    Standing Expiration Date:   12/11/2020  . Lipid panel    Standing Status:   Future    Standing Expiration Date:   12/11/2020  . TSH    Standing Status:   Future    Standing Expiration Date:   12/11/2020  . Basic metabolic panel    Standing Status:   Future    Standing Expiration Date:   12/11/2020  . Vitamin B12    Standing Status:   Future    Standing Expiration Date:   12/11/2020     I  discussed the assessment and treatment plan with the patient. The patient was provided an opportunity to ask questions and all were answered. The patient agreed with the plan and demonstrated an understanding of the instructions.   The patient was advised to call back or seek an in-person evaluation if the symptoms worsen or if the condition fails to improve as anticipated.  I provided 15 minutes of non-face-to-face time during this encounter.   [name], MD

## 2019-12-07 ENCOUNTER — Ambulatory Visit
Admission: RE | Admit: 2019-12-07 | Discharge: 2019-12-07 | Disposition: A | Discharge status: Home / Self Care | Payer: BC Managed Care – PPO | Source: Ambulatory Visit | Attending: Internal Medicine | Admitting: Internal Medicine

## 2019-12-07 DIAGNOSIS — R928 Other abnormal and inconclusive findings on diagnostic imaging of breast: Secondary | ICD-10-CM | POA: Insufficient documentation

## 2019-12-08 ENCOUNTER — Other Ambulatory Visit: Payer: Self-pay | Admitting: Internal Medicine

## 2019-12-09 ENCOUNTER — Telehealth: Payer: Self-pay | Admitting: Internal Medicine

## 2019-12-09 MED ORDER — BUPROPION HCL ER (XL) 150 MG PO TB24: ORAL_TABLET | ORAL | 1 refills | Status: DC

## 2019-12-09 MED ORDER — AMLODIPINE BESYLATE 5 MG PO TABS: 5.0000 mg | ORAL_TABLET | Freq: Every day | ORAL | 1 refills | Status: DC

## 2019-12-09 MED ORDER — VENLAFAXINE HCL ER 150 MG PO CP24: ORAL_CAPSULE | ORAL | 1 refills | Status: DC

## 2019-12-09 NOTE — Telephone Encounter (Signed)
Refill has been completed.

## 2019-12-09 NOTE — Telephone Encounter (Signed)
Patient is requesting refill on her medications; amLODipine (NORVASC) 5 MG tablet, buPROPion (WELLBUTRIN XL) 150 MG 24 hr tablet, and venlafaxine XR (EFFEXOR-XR) 150 MG 24 hr capsule

## 2019-12-12 ENCOUNTER — Encounter: Payer: Self-pay | Admitting: Internal Medicine

## 2019-12-12 NOTE — Assessment & Plan Note (Addendum)
Has quit drinking.  Doing well.

## 2019-12-12 NOTE — Assessment & Plan Note (Signed)
Had f/u right breast mammogram 03/2019.  Scheduled for f/u bilateral mammogram tomorrow.

## 2019-12-12 NOTE — Assessment & Plan Note (Signed)
Stable on wellbutrin and effexor.

## 2019-12-12 NOTE — Assessment & Plan Note (Signed)
Oral B12.  Follow B12 level.

## 2019-12-12 NOTE — Assessment & Plan Note (Signed)
Low cholesterol diet and exercise.  Follow lipid panel.   

## 2019-12-12 NOTE — Assessment & Plan Note (Signed)
Followed by nephrology.  Has f/u planned 02/2020.  Renal artery duplex negative.  Avoid antiinflammatories.  Follow renal function.

## 2019-12-12 NOTE — Assessment & Plan Note (Signed)
Blood pressure as outlined.  On amlodipine.  Follow pressures.  Follow metabolic panel.  

## 2020-02-09 ENCOUNTER — Other Ambulatory Visit: Payer: Self-pay

## 2020-02-09 ENCOUNTER — Emergency Department: Payer: BC Managed Care – PPO

## 2020-02-09 DIAGNOSIS — Z20822 Contact with and (suspected) exposure to covid-19: Secondary | ICD-10-CM | POA: Diagnosis present

## 2020-02-09 DIAGNOSIS — N1832 Chronic kidney disease, stage 3b: Secondary | ICD-10-CM | POA: Diagnosis present

## 2020-02-09 DIAGNOSIS — J449 Chronic obstructive pulmonary disease, unspecified: Secondary | ICD-10-CM | POA: Diagnosis present

## 2020-02-09 DIAGNOSIS — F329 Major depressive disorder, single episode, unspecified: Secondary | ICD-10-CM | POA: Diagnosis present

## 2020-02-09 DIAGNOSIS — Z880 Allergy status to penicillin: Secondary | ICD-10-CM

## 2020-02-09 DIAGNOSIS — D72829 Elevated white blood cell count, unspecified: Secondary | ICD-10-CM | POA: Diagnosis present

## 2020-02-09 DIAGNOSIS — Z885 Allergy status to narcotic agent status: Secondary | ICD-10-CM

## 2020-02-09 DIAGNOSIS — Z9114 Patient's other noncompliance with medication regimen: Secondary | ICD-10-CM

## 2020-02-09 DIAGNOSIS — N179 Acute kidney failure, unspecified: Secondary | ICD-10-CM | POA: Diagnosis not present

## 2020-02-09 DIAGNOSIS — Z79899 Other long term (current) drug therapy: Secondary | ICD-10-CM

## 2020-02-09 DIAGNOSIS — I5041 Acute combined systolic (congestive) and diastolic (congestive) heart failure: Secondary | ICD-10-CM | POA: Diagnosis present

## 2020-02-09 DIAGNOSIS — Z7901 Long term (current) use of anticoagulants: Secondary | ICD-10-CM

## 2020-02-09 DIAGNOSIS — Z716 Tobacco abuse counseling: Secondary | ICD-10-CM

## 2020-02-09 DIAGNOSIS — Z23 Encounter for immunization: Secondary | ICD-10-CM

## 2020-02-09 DIAGNOSIS — R000 Tachycardia, unspecified: Secondary | ICD-10-CM | POA: Diagnosis present

## 2020-02-09 DIAGNOSIS — I700 Atherosclerosis of aorta: Secondary | ICD-10-CM | POA: Diagnosis present

## 2020-02-09 DIAGNOSIS — K219 Gastro-esophageal reflux disease without esophagitis: Secondary | ICD-10-CM | POA: Diagnosis present

## 2020-02-09 DIAGNOSIS — I480 Paroxysmal atrial fibrillation: Secondary | ICD-10-CM | POA: Diagnosis present

## 2020-02-09 DIAGNOSIS — J9601 Acute respiratory failure with hypoxia: Secondary | ICD-10-CM | POA: Diagnosis present

## 2020-02-09 DIAGNOSIS — F1011 Alcohol abuse, in remission: Secondary | ICD-10-CM | POA: Diagnosis present

## 2020-02-09 DIAGNOSIS — T502X5A Adverse effect of carbonic-anhydrase inhibitors, benzothiadiazides and other diuretics, initial encounter: Secondary | ICD-10-CM | POA: Diagnosis not present

## 2020-02-09 DIAGNOSIS — I130 Hypertensive heart and chronic kidney disease with heart failure and stage 1 through stage 4 chronic kidney disease, or unspecified chronic kidney disease: Principal | ICD-10-CM | POA: Diagnosis present

## 2020-02-09 DIAGNOSIS — R0902 Hypoxemia: Secondary | ICD-10-CM | POA: Diagnosis not present

## 2020-02-09 DIAGNOSIS — Z881 Allergy status to other antibiotic agents status: Secondary | ICD-10-CM

## 2020-02-09 DIAGNOSIS — Z888 Allergy status to other drugs, medicaments and biological substances status: Secondary | ICD-10-CM

## 2020-02-09 DIAGNOSIS — F17210 Nicotine dependence, cigarettes, uncomplicated: Secondary | ICD-10-CM | POA: Diagnosis present

## 2020-02-09 DIAGNOSIS — Z90710 Acquired absence of both cervix and uterus: Secondary | ICD-10-CM

## 2020-02-09 DIAGNOSIS — I248 Other forms of acute ischemic heart disease: Secondary | ICD-10-CM | POA: Diagnosis present

## 2020-02-09 DIAGNOSIS — E782 Mixed hyperlipidemia: Secondary | ICD-10-CM | POA: Diagnosis present

## 2020-02-09 LAB — CBC WITH DIFFERENTIAL/PLATELET
Abs Immature Granulocytes: 0.04 10*3/uL (ref 0.00–0.07)
Basophils Absolute: 0.1 10*3/uL (ref 0.0–0.1)
Basophils Relative: 1 %
Eosinophils Absolute: 0.2 10*3/uL (ref 0.0–0.5)
Eosinophils Relative: 1 %
HCT: 36.1 % (ref 36.0–46.0)
Hemoglobin: 11.7 g/dL — ABNORMAL LOW (ref 12.0–15.0)
Immature Granulocytes: 0 %
Lymphocytes Relative: 13 %
Lymphs Abs: 1.6 10*3/uL (ref 0.7–4.0)
MCH: 29.1 pg (ref 26.0–34.0)
MCHC: 32.4 g/dL (ref 30.0–36.0)
MCV: 89.8 fL (ref 80.0–100.0)
Monocytes Absolute: 1 10*3/uL (ref 0.1–1.0)
Monocytes Relative: 8 %
Neutro Abs: 9.7 10*3/uL — ABNORMAL HIGH (ref 1.7–7.7)
Neutrophils Relative %: 77 %
Platelets: 315 10*3/uL (ref 150–400)
RBC: 4.02 MIL/uL (ref 3.87–5.11)
RDW: 13.3 % (ref 11.5–15.5)
WBC: 12.7 10*3/uL — ABNORMAL HIGH (ref 4.0–10.5)
nRBC: 0 % (ref 0.0–0.2)

## 2020-02-09 LAB — COMPREHENSIVE METABOLIC PANEL
ALT: 29 U/L (ref 0–44)
AST: 31 U/L (ref 15–41)
Albumin: 3.8 g/dL (ref 3.5–5.0)
Alkaline Phosphatase: 187 U/L — ABNORMAL HIGH (ref 38–126)
Anion gap: 9 (ref 5–15)
BUN: 26 mg/dL — ABNORMAL HIGH (ref 8–23)
CO2: 22 mmol/L (ref 22–32)
Calcium: 9 mg/dL (ref 8.9–10.3)
Chloride: 107 mmol/L (ref 98–111)
Creatinine, Ser: 1.42 mg/dL — ABNORMAL HIGH (ref 0.44–1.00)
GFR calc Af Amer: 45 mL/min — ABNORMAL LOW (ref >60)
GFR calc non Af Amer: 39 mL/min — ABNORMAL LOW (ref >60)
Glucose, Bld: 135 mg/dL — ABNORMAL HIGH (ref 70–99)
Potassium: 4.3 mmol/L (ref 3.5–5.1)
Sodium: 138 mmol/L (ref 135–145)
Total Bilirubin: 0.9 mg/dL (ref 0.3–1.2)
Total Protein: 7 g/dL (ref 6.5–8.1)

## 2020-02-09 LAB — LACTIC ACID, PLASMA: Lactic Acid, Venous: 0.7 mmol/L (ref 0.5–1.9)

## 2020-02-09 LAB — TROPONIN I (HIGH SENSITIVITY): Troponin I (High Sensitivity): 20 ng/L — ABNORMAL HIGH (ref <18)

## 2020-02-09 NOTE — ED Triage Notes (Signed)
EMS brought pt in from home for c/o x3 days [facility] on exertion; 94% on 3l/min

## 2020-02-09 NOTE — ED Triage Notes (Signed)
PT toED via EMS from home c/o sob for 3 days. No cough or fever or CP. PT 91% on room air. No COPD/CHF/ashtma that pt is aware of.

## 2020-02-10 ENCOUNTER — Inpatient Hospital Stay (HOSPITAL_COMMUNITY)
Admit: 2020-02-10 | Discharge: 2020-02-10 | Disposition: A | Discharge status: Home / Self Care | Payer: BC Managed Care – PPO | Attending: Internal Medicine | Admitting: Internal Medicine

## 2020-02-10 ENCOUNTER — Emergency Department: Payer: BC Managed Care – PPO

## 2020-02-10 ENCOUNTER — Inpatient Hospital Stay
Admission: EM | Admit: 2020-02-10 | Discharge: 2020-02-13 | DRG: 291 | Disposition: A | Discharge status: Home / Self Care | Payer: BC Managed Care – PPO | Attending: Internal Medicine | Admitting: Internal Medicine

## 2020-02-10 ENCOUNTER — Other Ambulatory Visit: Payer: Self-pay

## 2020-02-10 DIAGNOSIS — F1011 Alcohol abuse, in remission: Secondary | ICD-10-CM | POA: Diagnosis present

## 2020-02-10 DIAGNOSIS — I110 Hypertensive heart disease with heart failure: Secondary | ICD-10-CM | POA: Diagnosis not present

## 2020-02-10 DIAGNOSIS — Z23 Encounter for immunization: Secondary | ICD-10-CM | POA: Diagnosis not present

## 2020-02-10 DIAGNOSIS — N179 Acute kidney failure, unspecified: Secondary | ICD-10-CM | POA: Diagnosis present

## 2020-02-10 DIAGNOSIS — E785 Hyperlipidemia, unspecified: Secondary | ICD-10-CM | POA: Diagnosis not present

## 2020-02-10 DIAGNOSIS — F17210 Nicotine dependence, cigarettes, uncomplicated: Secondary | ICD-10-CM | POA: Diagnosis present

## 2020-02-10 DIAGNOSIS — J449 Chronic obstructive pulmonary disease, unspecified: Secondary | ICD-10-CM | POA: Diagnosis present

## 2020-02-10 DIAGNOSIS — Z720 Tobacco use: Secondary | ICD-10-CM | POA: Diagnosis present

## 2020-02-10 DIAGNOSIS — Z90710 Acquired absence of both cervix and uterus: Secondary | ICD-10-CM | POA: Diagnosis not present

## 2020-02-10 DIAGNOSIS — R778 Other specified abnormalities of plasma proteins: Secondary | ICD-10-CM | POA: Diagnosis not present

## 2020-02-10 DIAGNOSIS — K219 Gastro-esophageal reflux disease without esophagitis: Secondary | ICD-10-CM | POA: Diagnosis present

## 2020-02-10 DIAGNOSIS — I5031 Acute diastolic (congestive) heart failure: Secondary | ICD-10-CM | POA: Diagnosis not present

## 2020-02-10 DIAGNOSIS — Z885 Allergy status to narcotic agent status: Secondary | ICD-10-CM | POA: Diagnosis not present

## 2020-02-10 DIAGNOSIS — R0902 Hypoxemia: Secondary | ICD-10-CM | POA: Diagnosis present

## 2020-02-10 DIAGNOSIS — Z79899 Other long term (current) drug therapy: Secondary | ICD-10-CM | POA: Diagnosis not present

## 2020-02-10 DIAGNOSIS — I509 Heart failure, unspecified: Secondary | ICD-10-CM | POA: Diagnosis not present

## 2020-02-10 DIAGNOSIS — Z880 Allergy status to penicillin: Secondary | ICD-10-CM | POA: Diagnosis not present

## 2020-02-10 DIAGNOSIS — I700 Atherosclerosis of aorta: Secondary | ICD-10-CM | POA: Diagnosis present

## 2020-02-10 DIAGNOSIS — Z881 Allergy status to other antibiotic agents status: Secondary | ICD-10-CM | POA: Diagnosis not present

## 2020-02-10 DIAGNOSIS — J9601 Acute respiratory failure with hypoxia: Secondary | ICD-10-CM | POA: Diagnosis present

## 2020-02-10 DIAGNOSIS — I5021 Acute systolic (congestive) heart failure: Secondary | ICD-10-CM | POA: Diagnosis not present

## 2020-02-10 DIAGNOSIS — I248 Other forms of acute ischemic heart disease: Secondary | ICD-10-CM | POA: Diagnosis present

## 2020-02-10 DIAGNOSIS — I130 Hypertensive heart and chronic kidney disease with heart failure and stage 1 through stage 4 chronic kidney disease, or unspecified chronic kidney disease: Secondary | ICD-10-CM | POA: Diagnosis present

## 2020-02-10 DIAGNOSIS — R7989 Other specified abnormal findings of blood chemistry: Secondary | ICD-10-CM | POA: Diagnosis present

## 2020-02-10 DIAGNOSIS — D72829 Elevated white blood cell count, unspecified: Secondary | ICD-10-CM | POA: Diagnosis present

## 2020-02-10 DIAGNOSIS — F320 Major depressive disorder, single episode, mild: Secondary | ICD-10-CM | POA: Diagnosis present

## 2020-02-10 DIAGNOSIS — N1832 Chronic kidney disease, stage 3b: Secondary | ICD-10-CM | POA: Diagnosis present

## 2020-02-10 DIAGNOSIS — I420 Dilated cardiomyopathy: Secondary | ICD-10-CM

## 2020-02-10 DIAGNOSIS — R0600 Dyspnea, unspecified: Secondary | ICD-10-CM

## 2020-02-10 DIAGNOSIS — I5041 Acute combined systolic (congestive) and diastolic (congestive) heart failure: Secondary | ICD-10-CM | POA: Diagnosis present

## 2020-02-10 DIAGNOSIS — J90 Pleural effusion, not elsewhere classified: Secondary | ICD-10-CM | POA: Diagnosis not present

## 2020-02-10 DIAGNOSIS — I10 Essential (primary) hypertension: Secondary | ICD-10-CM | POA: Diagnosis not present

## 2020-02-10 DIAGNOSIS — Z20822 Contact with and (suspected) exposure to covid-19: Secondary | ICD-10-CM | POA: Diagnosis present

## 2020-02-10 DIAGNOSIS — Z888 Allergy status to other drugs, medicaments and biological substances status: Secondary | ICD-10-CM | POA: Diagnosis not present

## 2020-02-10 DIAGNOSIS — I5042 Chronic combined systolic (congestive) and diastolic (congestive) heart failure: Secondary | ICD-10-CM

## 2020-02-10 DIAGNOSIS — I5043 Acute on chronic combined systolic (congestive) and diastolic (congestive) heart failure: Secondary | ICD-10-CM

## 2020-02-10 DIAGNOSIS — E782 Mixed hyperlipidemia: Secondary | ICD-10-CM | POA: Diagnosis present

## 2020-02-10 DIAGNOSIS — I480 Paroxysmal atrial fibrillation: Secondary | ICD-10-CM | POA: Diagnosis present

## 2020-02-10 DIAGNOSIS — N1831 Chronic kidney disease, stage 3a: Secondary | ICD-10-CM

## 2020-02-10 DIAGNOSIS — F32A Depression, unspecified: Secondary | ICD-10-CM | POA: Diagnosis present

## 2020-02-10 DIAGNOSIS — F329 Major depressive disorder, single episode, unspecified: Secondary | ICD-10-CM | POA: Diagnosis present

## 2020-02-10 DIAGNOSIS — N183 Chronic kidney disease, stage 3 unspecified: Secondary | ICD-10-CM | POA: Diagnosis not present

## 2020-02-10 DIAGNOSIS — R000 Tachycardia, unspecified: Secondary | ICD-10-CM | POA: Diagnosis present

## 2020-02-10 HISTORY — DX: Mixed hyperlipidemia: E78.2

## 2020-02-10 HISTORY — DX: Tobacco use: Z72.0

## 2020-02-10 HISTORY — DX: Chronic kidney disease, stage 3 unspecified: N18.30

## 2020-02-10 HISTORY — DX: Deficiency of other specified B group vitamins: E53.8

## 2020-02-10 LAB — URINALYSIS, COMPLETE (UACMP) WITH MICROSCOPIC
Bacteria, UA: NONE SEEN
Bilirubin Urine: NEGATIVE
Glucose, UA: NEGATIVE mg/dL
Hgb urine dipstick: NEGATIVE
Ketones, ur: NEGATIVE mg/dL
Leukocytes,Ua: NEGATIVE
Nitrite: NEGATIVE
Protein, ur: 30 mg/dL — AB
Specific Gravity, Urine: 1.006 (ref 1.005–1.030)
Squamous Epithelial / HPF: NONE SEEN (ref 0–5)
pH: 5 (ref 5.0–8.0)

## 2020-02-10 LAB — URINE DRUG SCREEN, QUALITATIVE (ARMC ONLY)
Amphetamines, Ur Screen: NOT DETECTED
Barbiturates, Ur Screen: NOT DETECTED
Benzodiazepine, Ur Scrn: NOT DETECTED
Cannabinoid 50 Ng, Ur ~~LOC~~: NOT DETECTED
Cocaine Metabolite,Ur ~~LOC~~: NOT DETECTED
MDMA (Ecstasy)Ur Screen: NOT DETECTED
Methadone Scn, Ur: NOT DETECTED
Opiate, Ur Screen: NOT DETECTED
Phencyclidine (PCP) Ur S: NOT DETECTED
Tricyclic, Ur Screen: NOT DETECTED

## 2020-02-10 LAB — TROPONIN I (HIGH SENSITIVITY)
Troponin I (High Sensitivity): 20 ng/L — ABNORMAL HIGH (ref <18)
Troponin I (High Sensitivity): 16 ng/L (ref <18)
Troponin I (High Sensitivity): 17 ng/L (ref <18)
Troponin I (High Sensitivity): 16 ng/L (ref <18)

## 2020-02-10 LAB — ECHOCARDIOGRAM COMPLETE

## 2020-02-10 LAB — SARS CORONAVIRUS 2 BY RT PCR (HOSPITAL ORDER, PERFORMED IN ~~LOC~~ HOSPITAL LAB): SARS Coronavirus 2: NEGATIVE

## 2020-02-10 LAB — BRAIN NATRIURETIC PEPTIDE: B Natriuretic Peptide: 3345.9 pg/mL — ABNORMAL HIGH (ref 0.0–100.0)

## 2020-02-10 LAB — HIV ANTIBODY (ROUTINE TESTING W REFLEX): HIV Screen 4th Generation wRfx: NONREACTIVE

## 2020-02-10 MED ORDER — NICOTINE 21 MG/24HR TD PT24
21.0000 mg | MEDICATED_PATCH | Freq: Every day | TRANSDERMAL | Status: DC
  Administered 2020-02-10 – 2020-02-13 (×4): 21 mg via TRANSDERMAL
  Filled 2020-02-10 (×4): qty 1

## 2020-02-10 MED ORDER — CARVEDILOL 6.25 MG PO TABS
6.2500 mg | ORAL_TABLET | Freq: Two times a day (BID) | ORAL | Status: DC
  Administered 2020-02-10 – 2020-02-11 (×2): 6.25 mg via ORAL
  Filled 2020-02-10 (×2): qty 1

## 2020-02-10 MED ORDER — ENOXAPARIN SODIUM 40 MG/0.4ML ~~LOC~~ SOLN: 40.0000 mg | SUBCUTANEOUS | Status: DC

## 2020-02-10 MED ORDER — ENOXAPARIN SODIUM 60 MG/0.6ML ~~LOC~~ SOLN
1.0000 mg/kg | Freq: Two times a day (BID) | SUBCUTANEOUS | Status: DC
  Administered 2020-02-10 – 2020-02-12 (×3): 55 mg via SUBCUTANEOUS
  Filled 2020-02-10 (×6): qty 0.6

## 2020-02-10 MED ORDER — ASPIRIN EC 81 MG PO TBEC
81.0000 mg | DELAYED_RELEASE_TABLET | Freq: Every day | ORAL | Status: DC
  Administered 2020-02-10 – 2020-02-13 (×4): 81 mg via ORAL
  Filled 2020-02-10 (×4): qty 1

## 2020-02-10 MED ORDER — HYDRALAZINE HCL 20 MG/ML IJ SOLN: 5.0000 mg | INTRAMUSCULAR | Status: DC | PRN

## 2020-02-10 MED ORDER — METOPROLOL TARTRATE 25 MG PO TABS: 25.0000 mg | ORAL_TABLET | Freq: Two times a day (BID) | ORAL | Status: DC

## 2020-02-10 MED ORDER — VENLAFAXINE HCL ER 75 MG PO CP24
150.0000 mg | ORAL_CAPSULE | Freq: Every day | ORAL | Status: DC
  Administered 2020-02-11 – 2020-02-13 (×3): 150 mg via ORAL
  Filled 2020-02-10 (×2): qty 2
  Filled 2020-02-10 (×2): qty 1

## 2020-02-10 MED ORDER — DM-GUAIFENESIN ER 30-600 MG PO TB12
1.0000 | ORAL_TABLET | Freq: Two times a day (BID) | ORAL | Status: DC
  Administered 2020-02-10 – 2020-02-13 (×7): 1 via ORAL
  Filled 2020-02-10 (×7): qty 1

## 2020-02-10 MED ORDER — CARVEDILOL 6.25 MG PO TABS: 6.2500 mg | ORAL_TABLET | Freq: Two times a day (BID) | ORAL | Status: DC

## 2020-02-10 MED ORDER — IPRATROPIUM-ALBUTEROL 0.5-2.5 (3) MG/3ML IN SOLN
3.0000 mL | Freq: Once | RESPIRATORY_TRACT | Status: AC
  Administered 2020-02-10: 3 mL via RESPIRATORY_TRACT
  Filled 2020-02-10: qty 3

## 2020-02-10 MED ORDER — ALBUTEROL SULFATE (2.5 MG/3ML) 0.083% IN NEBU: 2.5000 mg | INHALATION_SOLUTION | RESPIRATORY_TRACT | Status: DC | PRN

## 2020-02-10 MED ORDER — SODIUM CHLORIDE 0.9 % IV SOLN: 250.0000 mL | INTRAVENOUS | Status: DC | PRN

## 2020-02-10 MED ORDER — IOHEXOL 350 MG/ML SOLN
60.0000 mL | Freq: Once | INTRAVENOUS | Status: AC | PRN
  Administered 2020-02-10: 60 mL via INTRAVENOUS

## 2020-02-10 MED ORDER — SACUBITRIL-VALSARTAN 24-26 MG PO TABS
1.0000 | ORAL_TABLET | Freq: Two times a day (BID) | ORAL | Status: DC
  Administered 2020-02-10 – 2020-02-13 (×5): 1 via ORAL
  Filled 2020-02-10 (×8): qty 1

## 2020-02-10 MED ORDER — ATORVASTATIN CALCIUM 20 MG PO TABS
40.0000 mg | ORAL_TABLET | Freq: Every day | ORAL | Status: DC
  Administered 2020-02-10 – 2020-02-13 (×4): 40 mg via ORAL
  Filled 2020-02-10 (×4): qty 2

## 2020-02-10 MED ORDER — LORATADINE 10 MG PO TABS
10.0000 mg | ORAL_TABLET | Freq: Every day | ORAL | Status: DC
  Administered 2020-02-10 – 2020-02-13 (×4): 10 mg via ORAL
  Filled 2020-02-10 (×4): qty 1

## 2020-02-10 MED ORDER — FUROSEMIDE 10 MG/ML IJ SOLN
60.0000 mg | Freq: Once | INTRAMUSCULAR | Status: AC
  Administered 2020-02-10: 60 mg via INTRAVENOUS
  Filled 2020-02-10: qty 8

## 2020-02-10 MED ORDER — AMLODIPINE BESYLATE 5 MG PO TABS
5.0000 mg | ORAL_TABLET | Freq: Every day | ORAL | Status: DC
  Administered 2020-02-10: 5 mg via ORAL
  Filled 2020-02-10: qty 1

## 2020-02-10 MED ORDER — SODIUM CHLORIDE 0.9% FLUSH: 3.0000 mL | INTRAVENOUS | Status: DC | PRN

## 2020-02-10 MED ORDER — SODIUM CHLORIDE 0.9% FLUSH
3.0000 mL | Freq: Two times a day (BID) | INTRAVENOUS | Status: DC
  Administered 2020-02-10 – 2020-02-12 (×4): 3 mL via INTRAVENOUS

## 2020-02-10 MED ORDER — DILTIAZEM HCL 60 MG PO TABS
30.0000 mg | ORAL_TABLET | Freq: Four times a day (QID) | ORAL | Status: DC
  Administered 2020-02-10: 30 mg via ORAL
  Filled 2020-02-10: qty 1

## 2020-02-10 MED ORDER — BUPROPION HCL ER (XL) 150 MG PO TB24
150.0000 mg | ORAL_TABLET | Freq: Every day | ORAL | Status: DC
  Administered 2020-02-10 – 2020-02-13 (×4): 150 mg via ORAL
  Filled 2020-02-10 (×4): qty 1

## 2020-02-10 MED ORDER — METHYLPREDNISOLONE SODIUM SUCC 125 MG IJ SOLR
125.0000 mg | Freq: Once | INTRAMUSCULAR | Status: AC
  Administered 2020-02-10: 125 mg via INTRAVENOUS
  Filled 2020-02-10: qty 2

## 2020-02-10 MED ORDER — PANTOPRAZOLE SODIUM 40 MG PO TBEC
40.0000 mg | DELAYED_RELEASE_TABLET | Freq: Every day | ORAL | Status: DC
  Administered 2020-02-10 – 2020-02-13 (×4): 40 mg via ORAL
  Filled 2020-02-10 (×4): qty 1

## 2020-02-10 MED ORDER — FUROSEMIDE 10 MG/ML IJ SOLN
40.0000 mg | Freq: Two times a day (BID) | INTRAMUSCULAR | Status: DC
  Administered 2020-02-10 – 2020-02-11 (×3): 40 mg via INTRAVENOUS
  Filled 2020-02-10 (×3): qty 4

## 2020-02-10 MED ORDER — ONDANSETRON HCL 4 MG/2ML IJ SOLN
4.0000 mg | Freq: Three times a day (TID) | INTRAMUSCULAR | Status: DC | PRN
  Administered 2020-02-11: 4 mg via INTRAVENOUS
  Filled 2020-02-10: qty 2

## 2020-02-10 MED ORDER — ACETAMINOPHEN 325 MG PO TABS: 650.0000 mg | ORAL_TABLET | Freq: Four times a day (QID) | ORAL | Status: DC | PRN

## 2020-02-10 NOTE — H&P (Signed)
History and Physical    Dawn Sherman:[id number] DOB: 11-06-1955 DOA: 02/10/2020  Referring MD/NP/PA:   PCP: [name], MD   Patient coming from:  The patient is coming from home.  At baseline, pt is independent for most of ADL.        Chief Complaint: SOB  HPI: Dawn Sherman is a 64 y.o. female with medical history significant of hypertension, hyperlipidemia, GERD, depression, endometriosis, CKD stage III, alcohol abuse in remission, tobacco abuse, who presents with shortness of breath.  Patient states that she has been having shortness breath in the past 3 days, which has been progressively worsening.  Patient denies cough, chest pain, fever or chills.  Patient has oxygen desaturation to 90% on room air, which improved to 97% on 3 L nasal cannula oxygen.  No nausea vomiting, diarrhea, abdominal pain, symptoms of UTI or unilateral weakness.  Patient states that she stopped drinking alcohol 1.5 years ago.  ED Course: pt was found to have BNP 3345.9, troponin 20 -->20, negative COVID-19 PCR, negative urinalysis, lactic acid of 0.7, stable renal function, temperature normal, blood pressure 143/73, tachycardia, RR 23.  Chest x-ray showed COPD without infiltration.  CT angiogram was negative for PE, but showed interstitial pulmonary edema, and bilateral small pleural effusion.  Patient is admitted to progressive bed as inpatient. Dr. Rockey Situ of card is consulted.  Review of Systems:   General: no fevers, chills, no body weight gain, has fatigue HEENT: no blurry vision, hearing changes or sore throat Respiratory: has dyspnea, no coughing, wheezing CV: no chest pain, no palpitations GI: no nausea, vomiting, abdominal pain, diarrhea, constipation GU: no dysuria, burning on urination, increased urinary frequency, hematuria  Ext: has trace leg edema Neuro: no unilateral weakness, numbness, or tingling, no vision change or hearing loss Skin: no rash, no skin tear. MSK: No muscle spasm,  no deformity, no limitation of range of movement in spin Heme: No easy bruising.  Travel history: No recent long distant travel.  Allergy:  Allergies  Allergen Reactions  . Amoxicillin   . Benicar [Olmesartan]   . Codeine Sulfate   . Morphine And Related   . Penicillins   . Sulfate   . Clindamycin/Lincomycin Rash    Past Medical History:  Diagnosis Date  . Depression   . Endometriosis   . Hypertension     Past Surgical History:  Procedure Laterality Date  . ABDOMINAL HYSTERECTOMY    . BREAST BIOPSY Right 2015   neg-  FIBROADENOMA  . TAH/RSO  1999   secondary to bleeding and endometriosis (Dr Vernie Ammons)    Social History:  reports that she has been smoking cigarettes. She has a 20.00 pack-year smoking history. She has never used smokeless tobacco. She reports previous alcohol use. She reports that she does not use drugs.  Family History:  Family History  Problem Relation Age of Onset  . Hypercholesterolemia Mother   . Breast cancer Neg Hx   . Colon cancer Neg Hx      Prior to Admission medications   Medication Sig Start Date End Date Taking? Authorizing Provider  acetaminophen (TYLENOL) 325 MG tablet Take 650 mg by mouth every 6 (six) hours as needed.   Yes [provider]  amLODipine (NORVASC) 5 MG tablet Take 1 tablet (5 mg total) by mouth daily. 12/09/19  Yes [name], MD  buPROPion (WELLBUTRIN XL) 150 MG 24 hr tablet One tab po daily 12/09/19  Yes [name], MD  loratadine (CLARITIN) 10 MG  tablet Take 10 mg by mouth daily.   Yes [provider]  omeprazole (PRILOSEC) 20 MG capsule Take 20 mg by mouth daily.   Yes [provider]  venlafaxine XR (EFFEXOR-XR) 150 MG 24 hr capsule TAKE 1 CAPSULE BY MOUTH EVERY DAY 12/09/19  Yes [name], MD    Physical Exam: Vitals:   02/10/20 1030 02/10/20 1135 02/10/20 1136 02/10/20 1142  BP: 130/82 133/89 133/89   Pulse:   84 79  Resp: 19  19   Temp:      SpO2: 100%  99% 99%    Weight:      Height:       General: Not in acute distress HEENT:       Eyes: PERRL, EOMI, no scleral icterus.       ENT: No discharge from the ears and nose, no pharynx injection, no tonsillar enlargement.        Neck: positive JVD, no bruit, no mass felt. Heme: No neck lymph node enlargement. Cardiac: S1/S2, RRR, No murmurs, No gallops or rubs. Respiratory: No rales, wheezing, rhonchi or rubs. GI: Soft, nondistended, nontender, no rebound pain, no organomegaly, BS present. GU: No hematuria Ext: has trace leg edema bilaterally.1+DP/PT pulse bilaterally. Musculoskeletal: No joint deformities, No joint redness or warmth, no limitation of ROM in spin. Skin: No rashes.  Neuro: Alert, oriented X3, cranial nerves II-XII grossly intact, moves all extremities normally.   Psych: Patient is not psychotic, no suicidal or hemocidal ideation.  Labs on Admission: I have personally reviewed following labs and imaging studies  CBC: Recent Labs  Lab 02/09/20 2024  WBC 12.7*  NEUTROABS 9.7*  HGB 11.7*  HCT 36.1  MCV 89.8  PLT 789   Basic Metabolic Panel: Recent Labs  Lab 02/09/20 2216  NA 138  K 4.3  CL 107  CO2 22  GLUCOSE 135*  BUN 26*  CREATININE 1.42*  CALCIUM 9.0   GFR: Estimated Creatinine Clearance: 35.3 mL/min (A) (by C-G formula based on SCr of 1.42 mg/dL (H)). Liver Function Tests: Recent Labs  Lab 02/09/20 2216  AST 31  ALT 29  ALKPHOS 187*  BILITOT 0.9  PROT 7.0  ALBUMIN 3.8   No results for input(s): LIPASE, AMYLASE in the last 168 hours. No results for input(s): AMMONIA in the last 168 hours. Coagulation Profile: No results for input(s): INR, PROTIME in the last 168 hours. Cardiac Enzymes: No results for input(s): CKTOTAL, CKMB, CKMBINDEX, TROPONINI in the last 168 hours. BNP (last 3 results) No results for input(s): PROBNP in the last 8760 hours. HbA1C: No results for input(s): HGBA1C in the last 72 hours. CBG: No results for input(s): GLUCAP in  the last 168 hours. Lipid Profile: No results for input(s): CHOL, HDL, LDLCALC, TRIG, CHOLHDL, LDLDIRECT in the last 72 hours. Thyroid Function Tests: No results for input(s): TSH, T4TOTAL, FREET4, T3FREE, THYROIDAB in the last 72 hours. Anemia Panel: No results for input(s): VITAMINB12, FOLATE, FERRITIN, TIBC, IRON, RETICCTPCT in the last 72 hours. Urine analysis:    Component Value Date/Time   COLORURINE COLORLESS (A) 02/10/2020 0242   APPEARANCEUR CLEAR (A) 02/10/2020 0242   LABSPEC 1.006 02/10/2020 0242   PHURINE 5.0 02/10/2020 0242   GLUCOSEU NEGATIVE 02/10/2020 0242   GLUCOSEU NEGATIVE 01/22/2017 1052   HGBUR NEGATIVE 02/10/2020 0242   BILIRUBINUR NEGATIVE 02/10/2020 0242   KETONESUR NEGATIVE 02/10/2020 0242   PROTEINUR 30 (A) 02/10/2020 0242   UROBILINOGEN 0.2 01/22/2017 1052   NITRITE NEGATIVE 02/10/2020 0242  LEUKOCYTESUR NEGATIVE 02/10/2020 0242   Sepsis Labs: @LABRCNTIP (procalcitonin:4,lacticidven:4) ) Recent Results (from the past 240 hour(s))  SARS Coronavirus 2 by RT PCR (hospital order, performed in [facility] hospital lab) Nasopharyngeal Nasopharyngeal Swab     Status: None   Collection Time: 02/10/20  2:42 AM   Specimen: Nasopharyngeal Swab  Result Value Ref Range Status   SARS Coronavirus 2 NEGATIVE NEGATIVE Final    Comment: (NOTE) SARS-CoV-2 target nucleic acids are NOT DETECTED.  The SARS-CoV-2 RNA is generally detectable in upper and lower respiratory specimens during the acute phase of infection. The lowest concentration of SARS-CoV-2 viral copies this assay can detect is 250 copies / mL. A negative result does not preclude SARS-CoV-2 infection and should not be used as the sole basis for treatment or other patient management decisions.  A negative result may occur with improper specimen collection / handling, submission of specimen other than nasopharyngeal swab, presence of viral mutation(s) within the areas targeted by this assay, and inadequate  number of viral copies (<250 copies / mL). A negative result must be combined with clinical observations, patient history, and epidemiological information.  Fact Sheet for Patients:   StrictlyIdeas.no  Fact Sheet for Healthcare Providers: BankingDealers.co.za  This test is not yet approved or  cleared by the [place] FDA and has been authorized for detection and/or diagnosis of SARS-CoV-2 by FDA under an Emergency Use Authorization (EUA).  This EUA will remain in effect (meaning this test can be used) for the duration of the COVID-19 declaration under Section 564(b)(1) of the Act, 21 U.S.C. section 360bbb-3(b)(1), unless the authorization is terminated or revoked sooner.  Performed at [facility], [address] [place], [place] [address]      Radiological Exams on Admission: DG Chest 2 View  Result Date: 02/09/2020 CLINICAL DATA:  Shortness of breath EXAM: CHEST - 2 VIEW COMPARISON:  05/21/2006 FINDINGS: There is hyperinflation of the lungs compatible with COPD. Bibasilar linear densities, likely scarring. Platelike atelectasis or scarring in the left upper lobe. Cardiomegaly. No effusions or acute bony abnormality. Severe rightward scoliosis in the lower thoracic spine. IMPRESSION: COPD/chronic changes.  No active disease. Electronically Signed   By: Rolm Baptise M.D.   On: 02/09/2020 20:32   CT Angio Chest PE W and/or Wo Contrast  Result Date: 02/10/2020 CLINICAL DATA:  Hypoxemia EXAM: CT ANGIOGRAPHY CHEST WITH CONTRAST TECHNIQUE: Multidetector CT imaging of the chest was performed using the standard protocol during bolus administration of intravenous contrast. Multiplanar CT image reconstructions and MIPs were obtained to evaluate the vascular anatomy. CONTRAST:  27mL OMNIPAQUE IOHEXOL 350 MG/ML SOLN COMPARISON:  None. FINDINGS: Cardiovascular: There is a optimal opacification of the pulmonary arteries. There is no  central,segmental, or subsegmental filling defects within the pulmonary arteries. There is mild to moderate cardiomegaly present. No pericardial effusion or thickening. No evidence right heart strain. There is normal three-vessel brachiocephalic anatomy without proximal stenosis. Scattered mild aortic atherosclerosis is seen. Mediastinum/Nodes: No hilar, mediastinal, or axillary adenopathy. Thyroid gland, trachea, and esophagus demonstrate no significant findings. Lungs/Pleura: Interlobular septal thickening is seen throughout both lungs. There is small bilateral pleural effusions, right greater than left. Hazy patchy airspace opacity seen at both lung bases, right greater than left. Upper Abdomen: No acute abnormalities present in the visualized portions of the upper abdomen. Musculoskeletal: No chest wall abnormality. No acute or significant osseous findings. 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 Review of the MIP images confirms the above findings. IMPRESSION: No central, segmental,  or subsegmental pulmonary embolism. Small bilateral pleural effusions, right greater than left. Findings consistent with interstitial edema. Ground-glass opacities at both lung bases right greater than left which may be due to asymmetric edema and/or atelectasis. Electronically Signed   By: Prudencio Pair M.D.   On: 02/10/2020 03:29     EKG: Independently reviewed.  Sinus rhythm, QTC 426, T wave inversion in inferior leads, V4-V6   Assessment/Plan Principal Problem:   Acute CHF (congestive heart failure) (HCC) Active Problems:   Hypertension   Depression   Tobacco abuse   CKD (chronic kidney disease), stage IIIa   Leukocytosis   HLD (hyperlipidemia)   Elevated troponin   GERD (gastroesophageal reflux disease)   Acute respiratory failure with hypoxia (HCC)   Acute respiratory failure with hypoxia due to acute CHF: Patient has only trace amount of leg edema, but she has positive JVD, elevated BNP 3345,  clinically consistent with acute CHF.  Not sure which type of CHF, no 2D echo on record.  Given long history of hypertension, patient may have diastolic CHF.  Cardiology, Dr. Rockey Situ is consulted.  -Will admit to progressive unit as inpatient -Lasix 40 mg bid by IV (patient received 60 mg of Lasix in ED) -2d echo -Daily weights -strict I/O's -Low salt diet -Fluid restriction -Obtain REDs Vest reading  Elevated troponin: no CP. Trop 20 -->20 -->16 -->17. No CP.  Likely due to demand ischemia secondary CHF. -Trend troponin -Check A1c, FLP, UDS -Repeat EKG in morning -Follow-up 2D echo -ASA  HTN:  -Continue home medications: Amlodipine, -hydralazine prn  Depression -Wellbutrin, Effexo  Tobacco abuse -Nicotine patch  CKD (chronic kidney disease), stage IIIa: Stable -Follow-up of BMP  Leukocytosis: WBC 12.7.  No fever, no source of infection identified.  Likely reactive. -Follow-up with CBC  HLD (hyperlipidemia): Patient is not taking medications at home -Follow-up FLP  GERD (gastroesophageal reflux disease) -Protonix HCC)         DVT ppx: SQ Lovenox Code Status: Full code Family Communication: not done, no family member is at bed side.        Disposition Plan:  Anticipate discharge back to previous environment Consults called:  Dr. Rockey Situ of card is consulted. Admission status:  progressive unit as inpt      Status is: Inpatient  Remains inpatient appropriate because:Inpatient level of care appropriate due to severity of illness.  Patient has multiple comorbidities, now presents with new onset of acute CHF.  Patient has new oxygen requirement.  Also has elevated troponin.  Her presentation is highly complicated.  Patient is at high risk of deteriorating.  Will need to be treated in hospital for at least 2 days.   Dispo: The patient is from: Home              Anticipated d/c is to: Home              Anticipated d/c date is: 2 days              Patient  currently is not medically stable to d/c.          Date of Service 02/10/2020    Ivor Costa [facility]   If 7PM-7AM, please contact night-coverage [email or web address] 02/10/2020, 12:23 PM

## 2020-02-10 NOTE — ED Notes (Addendum)
Urine not collected at this time. Pt was unable to urinate in the hat. Pt states she is not sure what happened but it all went in the toilet. Encouraged to try again soon.

## 2020-02-10 NOTE — ED Notes (Signed)
Pt ambulatory to restroom at this time, steady gait, NAD noted

## 2020-02-10 NOTE — ED Provider Notes (Signed)
[facility] Emergency Department Provider Note  Time seen: 2:21 AM  I have reviewed the triage vital signs and the nursing notes.   HISTORY  Chief Complaint Shortness of Breath    HPI Dawn Sherman is a 64 y.o. female with a past medical history of CKD, hypertension, presents to the emergency department for shortness of breath.  According to the patient for the past 3 days she has been feeling short of breath, much worse with exertion.  Patient denies any history of COPD or asthma but states she is a daily smoker which is long-term.  Did not receive Covid vaccinations, has not had Covid.  Patient denies any fever or cough.  Denies any chest pain.  Patient does appear short of breath, desats into the 80s easily on room air.  Placed on 3 L currently satting in the mid upper 90s.   No leg pain or swelling.  Shortness of breath is mild when at rest moderate with any minimal exertion.  Past Medical History:  Diagnosis Date  . Depression   . Endometriosis   . Hypertension     Patient Active Problem List   Diagnosis Date Noted  . Abnormal mammogram 07/13/2019  . Sleep concern 10/17/2018  . History of alcohol abuse 07/31/2018  . B12 deficiency 07/31/2018  . CKD (chronic kidney disease) stage 3, GFR 30-59 ml/min 11/03/2016  . Health care maintenance 12/02/2014  . Tobacco abuse 12/02/2014  . Foot pain, left 11/29/2014  . Hypercholesterolemia 11/29/2014  . Encounter for screening colonoscopy 12/16/2013  . Lump or mass in breast 12/16/2013  . Breast mass 12/15/2012  . Hypertension 07/05/2012  . Depression 07/05/2012    Past Surgical History:  Procedure Laterality Date  . ABDOMINAL HYSTERECTOMY    . BREAST BIOPSY Right 2015   neg-  FIBROADENOMA  . TAH/RSO  1999   secondary to bleeding and endometriosis (Dr Vernie Ammons)    Prior to Admission medications   Medication Sig Start Date End Date Taking? Authorizing Provider  amLODipine (NORVASC) 5 MG tablet Take 1  tablet (5 mg total) by mouth daily. 12/09/19   [name], MD  buPROPion (WELLBUTRIN XL) 150 MG 24 hr tablet One tab po daily 12/09/19   [name], MD  venlafaxine XR (EFFEXOR-XR) 150 MG 24 hr capsule TAKE 1 CAPSULE BY MOUTH EVERY DAY 12/09/19   [name], MD    Allergies  Allergen Reactions  . Amoxicillin   . Benicar [Olmesartan]   . Codeine Sulfate   . Morphine And Related   . Penicillins   . Sulfate   . Clindamycin/Lincomycin Rash    Family History  Problem Relation Age of Onset  . Hypercholesterolemia Mother   . Breast cancer Neg Hx   . Colon cancer Neg Hx     Social History Social History   Tobacco Use  . Smoking status: Current Every Day Smoker    Packs/day: 1.00    Years: 20.00    Pack years: 20.00    Types: Cigarettes  . Smokeless tobacco: Never Used  Substance Use Topics  . Alcohol use: Not Currently    Alcohol/week: 0.0 standard drinks    Comment: occasional  . Drug use: No    Review of Systems Constitutional: Negative for fever Cardiovascular: Negative for chest pain. Respiratory: Positive for shortness of breath. Gastrointestinal: Negative for abdominal pain, vomiting  Musculoskeletal: Negative for leg pain or swelling. Neurological: Negative for headache All other ROS negative  ____________________________________________   PHYSICAL EXAM:  VITAL SIGNS: ED Triage Vitals  Enc Vitals Group     BP 02/09/20 2014 (!) 141/83     Pulse Rate 02/09/20 2014 (!) 109     Resp 02/09/20 2014 (!) 22     Temp 02/09/20 2014 98.2 F (36.8 C)     Temp src --      SpO2 02/09/20 1951 90 %     Weight 02/09/20 2015 123 lb (55.8 kg)     Height 02/09/20 2015 5\' 5"  (1.651 m)     Head Circumference --      Peak Flow --      Pain Score 02/09/20 2014 0     Pain Loc --      Pain Edu? --      Excl. in [place]? --    Constitutional: Alert and oriented. Well appearing and in no distress. Eyes: Normal exam ENT      Head: Normocephalic and atraumatic.       Mouth/Throat: Mucous membranes are moist. Cardiovascular: Normal rate, regular rhythm.  Respiratory: Moderate tachypnea, mild respiratory distress.  Mild expiratory wheeze bilaterally. Gastrointestinal: Soft and nontender. No distention.   Musculoskeletal: Nontender with normal range of motion in all extremities. No lower extremity tenderness or edema. Neurologic:  Normal speech and language. No gross focal neurologic deficits Skin:  Skin is warm, dry and intact.  Psychiatric: Mood and affect are normal.  ____________________________________________    EKG  EKG viewed and interpreted by myself shows a sinus tachycardia 108 bpm with a narrow QRS, normal axis, normal intervals.  Patient does have inferolateral T wave inversions with slight ST depression.  No old EKG for comparison.  ____________________________________________    RADIOLOGY  CTA negative for PE but does show signs of interstitial edema as well as pleural effusions.  ____________________________________________   INITIAL IMPRESSION / ASSESSMENT AND PLAN / ED COURSE  Pertinent labs & imaging results that were available during my care of the patient were reviewed by me and considered in my medical decision making (see chart for details).   Patient presents to the emergency department for shortness of breath, much worse with exertion.  Patient is satting in the upper 80s on room air.  Sats in the low to mid 90s on 3 L of oxygen.  Denies any chest pain no leg swelling.  Patient does have moderate tachypnea mild tachycardia.  Given the hypoxia with minimal expiratory wheeze we will obtain a CTA of the chest to rule out PE or infectious etiology.  Differential would include COPD, pneumonia, pneumothorax, ACS, pulmonary edema, Covid, pulmonary embolism.  We will treat with duo nebs and Solu-Medrol given slight expiratory wheeze and history of long-term smoking.  Patient agreeable to plan of care.  Patient does have  inferolateral T wave inversions with slight ST depression.  No old EKG for comparison.  Patient's troponin is slightly elevated at 20 however unchanged after several hours.  CTA negative for PE but does show signs of interstitial edema.  Given the slightly elevated troponin CTA consistent with interstitial edema with pleural effusions and inferolateral EKG changes patient will be admitted to the hospital service for ongoing treatment and work-up.  We will dose 60 mg of Lasix in the emergency department.  Patient agreeable to plan of care.  Dawn Sherman was evaluated in Emergency Department on 02/10/2020 for the symptoms described in the history of present illness. She was evaluated in the context of the global COVID-19 pandemic, which necessitated consideration that the patient  might be at risk for infection with the SARS-CoV-2 virus that causes COVID-19. Institutional protocols and algorithms that pertain to the evaluation of patients at risk for COVID-19 are in a state of rapid change based on information released by regulatory bodies including the CDC and federal and state organizations. These policies and algorithms were followed during the patient's care in the ED.  ____________________________________________   FINAL CLINICAL IMPRESSION(S) / ED DIAGNOSES  Dyspnea Hypoxia New onset CHF   Harvest Dark, MD 02/10/20 [phone]

## 2020-02-10 NOTE — ED Notes (Addendum)
Pt up to restroom with steady gait. No distress noted at this time. Hat moved on toilet to ensure collection of urine.

## 2020-02-10 NOTE — ED Notes (Signed)
Pt SpO2 99% on 4L [place], titrated to 3L [place]

## 2020-02-10 NOTE — Progress Notes (Signed)
*  PRELIMINARY RESULTS* Echocardiogram 2D Echocardiogram has been performed.  Dawn Sherman 02/10/2020, 3:14 PM

## 2020-02-10 NOTE — ED Notes (Addendum)
CT at the bedside; pt resting quietly with no distress noted at this time.

## 2020-02-10 NOTE — ED Notes (Addendum)
Pt up to restroom with steady gait. Seems alert and calm with no acute distress noted at this time. Pt given hat for toilet so urine can be collected.

## 2020-02-10 NOTE — ED Notes (Signed)
Pt up to restroom with steady gait. Assisted by ED tech Beth. No distress noted.

## 2020-02-10 NOTE — Consult Note (Signed)
Cardiology Consult    Patient ID: BULA CAVALIERI MRN: [id number], DOB/AGE: 65-May-1957   Admit date: 02/10/2020 Date of Consult: 02/10/2020  Primary Physician: [name], MD Primary Cardiologist: Ida Rogue, MD - new Requesting Provider: Mora Bellman, MD  Patient Profile    Dawn Sherman is a 64 y.o. female with a history of HTN, HL, Tob Abuse, CKD III, prior etoh abuse, and depression, who is being seen today for the evaluation of progressive dyspnea and CHF at the request of Dr. Blaine Hamper.  Past Medical History   Past Medical History:  Diagnosis Date  . B12 deficiency   . CKD (chronic kidney disease), stage III   . Depression   . Endometriosis   . Hypertension   . Mixed hyperlipidemia   . Tobacco abuse     Past Surgical History:  Procedure Laterality Date  . ABDOMINAL HYSTERECTOMY    . BREAST BIOPSY Right 2015   neg-  FIBROADENOMA  . TAH/RSO  1999   secondary to bleeding and endometriosis (Dr Vernie Ammons)     Allergies  Allergies  Allergen Reactions  . Amoxicillin   . Benicar [Olmesartan]   . Codeine Sulfate   . Morphine And Related   . Penicillins   . Sulfate   . Clindamycin/Lincomycin Rash    History of Present Illness    64 year old female with a history of hypertension, hyperlipidemia, tobacco abuse, stage III chronic kidney disease, prior alcohol abuse, depression, endometriosis, and B12 deficiency.  She does not have a prior cardiac history.  She continues to smoke up to about 1 pack/day.  She is retired from [employer].  She lives locally with her husband.  She does not routinely exercise.  She was in her usual state of health until approximately 3 days ago, when she began to notice dyspnea on exertion and trouble getting a satisfying breath.  Over the past few days, this is progressed, and she can now only walk about 50 feet prior to having to rest.  She denies chest pain or palpitations, but has been experiencing orthopnea.  She has not had any  change in her appetite and notes good p.o. intake.  She does not routinely weigh herself at home.  On the evening of June 16, she was experiencing significant dyspnea while trying to lie in bed, causing her to sit up on the side of the bed.  Her husband offered to take her to the ED however she felt so short of breath, that they decided to call EMS.  Following EMS arrival, she was placed on oxygen via the nasal cannula at 3 L/min.  She was saturating at 94% on arrival.  Admission ECG notable for sinus tachycardia with LVH.  Labs notable for stable creatinine of 1.42, BNP of 3345.9, and mild troponin elevation at 20  20  16.  Chest x-ray showed COPD and chronic changes without active disease while CTA of the chest was negative for PE but did show small, right greater than left, bilateral pleural effusions, interstitial edema, and mild aortic atherosclerosis.  She received furosemide 60 mg IV x1 this morning and has noted good output and significant improvement in dyspnea.  On telemetry, she has had runs of A. fib/flutter versus atrial tachycardia with rates climbing into the 140s at times.  These episodes have been asymptomatic.  Repeat ECG right now continues to show sinus tachycardia.  Inpatient Medications    . amLODipine  5 mg Oral Daily  . aspirin EC  81 mg Oral Daily  . buPROPion  150 mg Oral Daily  . dextromethorphan-guaiFENesin  1 tablet Oral BID  . enoxaparin (LOVENOX) injection  40 mg Subcutaneous Q24H  . loratadine  10 mg Oral Daily  . nicotine  21 mg Transdermal Daily  . pantoprazole  40 mg Oral Daily  . sodium chloride flush  3 mL Intravenous Q12H  . [START ON 02/11/2020] venlafaxine XR  150 mg Oral Q breakfast    Family History    Family History  Problem Relation Age of Onset  . Hypercholesterolemia Mother   . Breast cancer Neg Hx   . Colon cancer Neg Hx    She indicated that the status of her mother is unknown. She indicated that the status of her neg hx is unknown.   Social  History    Social History   Socioeconomic History  . Marital status: Married    Spouse name: Not on file  . Number of children: Not on file  . Years of education: Not on file  . Highest education level: Not on file  Occupational History  . Not on file  Tobacco Use  . Smoking status: Current Every Day Smoker    Packs/day: 1.00    Years: 40.00    Pack years: 40.00    Types: Cigarettes  . Smokeless tobacco: Never Used  Substance and Sexual Activity  . Alcohol use: Not Currently    Alcohol/week: 0.0 standard drinks    Comment: occasional  . Drug use: No  . Sexual activity: Not on file  Other Topics Concern  . Not on file  Social History Narrative   Lives in [place] with her husband.  She is retired from [employer].  She does not routinely exercise.   Social Determinants of Health   Financial Resource Strain:   . Difficulty of Paying Living Expenses:   Food Insecurity:   . Worried About Charity fundraiser in the Last Year:   . Arboriculturist in the Last Year:   Transportation Needs:   . Film/video editor (Medical):   Marland Kitchen Lack of Transportation (Non-Medical):   Physical Activity:   . Days of Exercise per Week:   . Minutes of Exercise per Session:   Stress:   . Feeling of Stress :   Social Connections:   . Frequency of Communication with Friends and Family:   . Frequency of Social Gatherings with Friends and Family:   . Attends Religious Services:   . Active Member of Clubs or Organizations:   . Attends Archivist Meetings:   Marland Kitchen Marital Status:   Intimate Partner Violence:   . Fear of Current or Ex-Partner:   . Emotionally Abused:   Marland Kitchen Physically Abused:   . Sexually Abused:      Review of Systems    General:  No chills, fever, night sweats or weight changes.  Cardiovascular:  No chest pain, +++ dyspnea on exertion, no edema, +++ orthopnea, no palpitations, paroxysmal nocturnal dyspnea. Dermatological: No rash,  lesions/masses Respiratory: No cough, +++ dyspnea Urologic: No hematuria, dysuria Abdominal:   No nausea, vomiting, diarrhea, bright red blood per rectum, melena, or hematemesis Neurologic:  No visual changes, wkns, changes in mental status. All other systems reviewed and are otherwise negative except as noted above.  Physical Exam    Blood pressure 133/89, pulse 79, temperature 98.2 F (36.8 C), resp. rate 19, height 5\' 5"  (1.651 m), weight 55.8 kg, SpO2 99 %.  General: Pleasant, NAD Psych: Normal affect. Neuro: Alert and oriented X 3. Moves all extremities spontaneously. HEENT: Normal  Neck: Supple without bruits.  JVP approximately 12 cm. Lungs:  Resp regular and unlabored, markedly diminished breath sounds bilaterally with scattered rhonchi and expiratory wheezes. Heart: Irregularly irregular, tachycardic, no s3, s4, or murmurs. Abdomen: Soft, non-tender, non-distended, BS + x 4.  Extremities: No clubbing, cyanosis or edema. DP/PT/Radials 2+ and equal bilaterally.  Labs    Cardiac Enzymes Recent Labs  Lab 02/09/20 2216 02/10/20 0119 02/10/20 1141  TROPONINIHS 20* 20* 16      Lab Results  Component Value Date   WBC 12.7 (H) 02/09/2020   HGB 11.7 (L) 02/09/2020   HCT 36.1 02/09/2020   MCV 89.8 02/09/2020   PLT 315 02/09/2020    Recent Labs  Lab 02/09/20 2216  NA 138  K 4.3  CL 107  CO2 22  BUN 26*  CREATININE 1.42*  CALCIUM 9.0  PROT 7.0  BILITOT 0.9  ALKPHOS 187*  ALT 29  AST 31  GLUCOSE 135*   Lab Results  Component Value Date   CHOL 235 (H) 09/14/2019   HDL 90.90 09/14/2019   LDLCALC 132 (H) 09/14/2019   TRIG 63.0 09/14/2019    Radiology Studies    DG Chest 2 View  Result Date: 02/09/2020 CLINICAL DATA:  Shortness of breath EXAM: CHEST - 2 VIEW COMPARISON:  05/21/2006 FINDINGS: There is hyperinflation of the lungs compatible with COPD. Bibasilar linear densities, likely scarring. Platelike atelectasis or scarring in the left upper lobe.  Cardiomegaly. No effusions or acute bony abnormality. Severe rightward scoliosis in the lower thoracic spine. IMPRESSION: COPD/chronic changes.  No active disease. Electronically Signed   By: Rolm Baptise M.D.   On: 02/09/2020 20:32   CT Angio Chest PE W and/or Wo Contrast  Result Date: 02/10/2020 CLINICAL DATA:  Hypoxemia EXAM: CT ANGIOGRAPHY CHEST WITH CONTRAST TECHNIQUE: Multidetector CT imaging of the chest was performed using the standard protocol during bolus administration of intravenous contrast. Multiplanar CT image reconstructions and MIPs were obtained to evaluate the vascular anatomy. CONTRAST:  49mL OMNIPAQUE IOHEXOL 350 MG/ML SOLN COMPARISON:  None. FINDINGS: Cardiovascular: There is a optimal opacification of the pulmonary arteries. There is no central,segmental, or subsegmental filling defects within the pulmonary arteries. There is mild to moderate cardiomegaly present. No pericardial effusion or thickening. No evidence right heart strain. There is normal three-vessel brachiocephalic anatomy without proximal stenosis. Scattered mild aortic atherosclerosis is seen. Mediastinum/Nodes: No hilar, mediastinal, or axillary adenopathy. Thyroid gland, trachea, and esophagus demonstrate no significant findings. Lungs/Pleura: Interlobular septal thickening is seen throughout both lungs. There is small bilateral pleural effusions, right greater than left. Hazy patchy airspace opacity seen at both lung bases, right greater than left. Upper Abdomen: No acute abnormalities present in the visualized portions of the upper abdomen. Musculoskeletal: No chest wall abnormality. No acute or significant osseous findings. 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 Review of the MIP images confirms the above findings. IMPRESSION: No central, segmental, or subsegmental pulmonary embolism. Small bilateral pleural effusions, right greater than left. Findings consistent with interstitial edema. Ground-glass  opacities at both lung bases right greater than left which may be due to asymmetric edema and/or atelectasis. Electronically Signed   By: Prudencio Pair M.D.   On: 02/10/2020 03:29    ECG & Cardiac Imaging    Sinus tachycardia, 108, LVH, baseline artifact - personally reviewed.  Assessment & Plan    1.  Acute congestive heart  failure: Patient presented to the emergency department with a 3-day history of progressive dyspnea on exertion and also dyspnea at rest with orthopnea.  CT chest here was negative for PE but did show interstitial edema.  BNP 3345.9 this morning.  She was treated with Lasix 60 mg IV x1 with good response and symptomatic improvement.  On examination, she does have JVD and markedly diminished breath sounds.  She is being admitted by the medicine team for further evaluation.  I will provide her with another dose of intravenous Lasix today.  She has been tachycardic since arrival and appears to be going in and out of A. Fib.  ? Role in worsening CHF.  Will add oral dilt in place of amlodipine.  Further recs pending echo (may need to switch to  blocker though actively wheezing).   2.  PAF:  Pt in sinus tachycardia on arrival but since, has had runs of PAF.  She denies any h/o palpitations.  Adding oral dilt as above. F/u echo.  Lytes ok.  F/u TSH.  CHA2DS2VASc = 3.  Cont lovenox but inc to 1mg /kg (CrCl 35.3) while we await EF assessment (may need invasive eval if abnormal).  3.  Elevated HsTroponin/demand ischemia:  Mild HsT elevation in the setting of above - 20  20  16.  No h/o chest pain.  Ao atherosclerosis noted on CTA chest.  Adding anticoagulation in the setting of #2.  Further recs pending echo. Cont asa.  Will add statin given Ao atherosclerosis and LDL of 132 in Jan.  4.  Essential HTN:  BP moderately elevated.  LVH noted on ECG.  Will change amlodipine to diltiazem in the setting of tachycardia/PAF.  Titrate as necessary.  Diurese as above.  5.  HL:  LDL 132 in 08/2019.   Will add statin in setting of mild HsT elevation and Ao atherosclerosis noted on CTA.  6.  COPD/Tob Abuse:  She does not carry a h/o COPD but moves little air on exam and does have scattered rhonchi and exp wheezing.  Inhalers/steroids per IM (received duoneb and solumedrol in ED). Smoking cessation advised.  7.  CKD III:  Creatstable @ 1.41.  Follow w/ diuresis.  Signed, Murray Hodgkins, NP 02/10/2020, 2:00 PM  For questions or updates, please contact   Please consult [email or web address] for contact info under Cardiology/STEMI.

## 2020-02-11 ENCOUNTER — Inpatient Hospital Stay (HOSPITAL_COMMUNITY): Payer: BC Managed Care – PPO

## 2020-02-11 DIAGNOSIS — J9601 Acute respiratory failure with hypoxia: Secondary | ICD-10-CM

## 2020-02-11 DIAGNOSIS — F329 Major depressive disorder, single episode, unspecified: Secondary | ICD-10-CM

## 2020-02-11 DIAGNOSIS — R0902 Hypoxemia: Secondary | ICD-10-CM

## 2020-02-11 DIAGNOSIS — I480 Paroxysmal atrial fibrillation: Secondary | ICD-10-CM

## 2020-02-11 DIAGNOSIS — I5021 Acute systolic (congestive) heart failure: Secondary | ICD-10-CM

## 2020-02-11 DIAGNOSIS — I10 Essential (primary) hypertension: Secondary | ICD-10-CM

## 2020-02-11 DIAGNOSIS — I5043 Acute on chronic combined systolic (congestive) and diastolic (congestive) heart failure: Secondary | ICD-10-CM

## 2020-02-11 LAB — BASIC METABOLIC PANEL
Anion gap: 10 (ref 5–15)
BUN: 33 mg/dL — ABNORMAL HIGH (ref 8–23)
CO2: 25 mmol/L (ref 22–32)
Calcium: 8.6 mg/dL — ABNORMAL LOW (ref 8.9–10.3)
Chloride: 103 mmol/L (ref 98–111)
Creatinine, Ser: 1.61 mg/dL — ABNORMAL HIGH (ref 0.44–1.00)
GFR calc Af Amer: 39 mL/min — ABNORMAL LOW (ref >60)
GFR calc non Af Amer: 33 mL/min — ABNORMAL LOW (ref >60)
Glucose, Bld: 112 mg/dL — ABNORMAL HIGH (ref 70–99)
Potassium: 4.2 mmol/L (ref 3.5–5.1)
Sodium: 138 mmol/L (ref 135–145)

## 2020-02-11 LAB — NM MYOCAR MULTI W/SPECT W/WALL MOTION / EF
Estimated workload: 1 METS
Exercise duration (min): 0 min
Exercise duration (sec): 0 s
LV dias vol: 186 mL (ref 46–106)
LV sys vol: 136 mL
MPHR: 156 {beats}/min
Peak HR: 104 {beats}/min
Percent HR: 66 %
Rest HR: 93 {beats}/min
SDS: 0
SRS: 2
SSS: 2
TID: 0.99

## 2020-02-11 LAB — LIPID PANEL
Cholesterol: 209 mg/dL — ABNORMAL HIGH (ref 0–200)
HDL: 90 mg/dL (ref >40)
LDL Cholesterol: 107 mg/dL — ABNORMAL HIGH (ref 0–99)
Total CHOL/HDL Ratio: 2.3 RATIO
Triglycerides: 62 mg/dL (ref <150)
VLDL: 12 mg/dL (ref 0–40)

## 2020-02-11 LAB — CBC
HCT: 35.6 % — ABNORMAL LOW (ref 36.0–46.0)
Hemoglobin: 11.9 g/dL — ABNORMAL LOW (ref 12.0–15.0)
MCH: 29.1 pg (ref 26.0–34.0)
MCHC: 33.4 g/dL (ref 30.0–36.0)
MCV: 87 fL (ref 80.0–100.0)
Platelets: 311 10*3/uL (ref 150–400)
RBC: 4.09 MIL/uL (ref 3.87–5.11)
RDW: 13.5 % (ref 11.5–15.5)
WBC: 11.1 10*3/uL — ABNORMAL HIGH (ref 4.0–10.5)
nRBC: 0 % (ref 0.0–0.2)

## 2020-02-11 LAB — HEMOGLOBIN A1C
Hgb A1c MFr Bld: 5.7 % — ABNORMAL HIGH (ref 4.8–5.6)
Mean Plasma Glucose: 117 mg/dL

## 2020-02-11 LAB — MAGNESIUM: Magnesium: 1.7 mg/dL (ref 1.7–2.4)

## 2020-02-11 MED ORDER — REGADENOSON 0.4 MG/5ML IV SOLN
0.4000 mg | Freq: Once | INTRAVENOUS | Status: AC
  Administered 2020-02-11: 0.4 mg via INTRAVENOUS
  Filled 2020-02-11: qty 5

## 2020-02-11 MED ORDER — PNEUMOCOCCAL VAC POLYVALENT 25 MCG/0.5ML IJ INJ
0.5000 mL | INJECTION | INTRAMUSCULAR | Status: AC
  Administered 2020-02-13: 0.5 mL via INTRAMUSCULAR
  Filled 2020-02-11: qty 0.5

## 2020-02-11 MED ORDER — TECHNETIUM TC 99M TETROFOSMIN IV KIT
30.0000 | PACK | Freq: Once | INTRAVENOUS | Status: AC | PRN
  Administered 2020-02-11: 30.91 via INTRAVENOUS
  Filled 2020-02-11: qty 30

## 2020-02-11 MED ORDER — CARVEDILOL 12.5 MG PO TABS
12.5000 mg | ORAL_TABLET | Freq: Two times a day (BID) | ORAL | Status: DC
  Administered 2020-02-11 – 2020-02-13 (×4): 12.5 mg via ORAL
  Filled 2020-02-11 (×4): qty 1

## 2020-02-11 MED ORDER — FUROSEMIDE 40 MG PO TABS
40.0000 mg | ORAL_TABLET | Freq: Every day | ORAL | Status: DC
  Administered 2020-02-12: 40 mg via ORAL
  Filled 2020-02-11: qty 1

## 2020-02-11 MED ORDER — TECHNETIUM TC 99M TETROFOSMIN IV KIT
10.0000 | PACK | Freq: Once | INTRAVENOUS | Status: AC | PRN
  Administered 2020-02-11: 10.48 via INTRAVENOUS
  Filled 2020-02-11: qty 10

## 2020-02-11 NOTE — Progress Notes (Signed)
Patient called out-  Having some dizziness, diaphoretic feeling and nausea.   Patient was getting up to go to bath room.  Symptoms started right after getting up and resolved once patient laid back down.  Patient did have a BM during episode.    Gave zofran for nausea and encouraged patient to not get up without someone in room and call nurse if another episode happens again.

## 2020-02-11 NOTE — ED Notes (Signed)
This tech in to pt room in attempts to obtain Heparin levels, tech unsuccessful. RN notified and lab called

## 2020-02-11 NOTE — Progress Notes (Signed)
Progress Note  Patient Name: Dawn Sherman Date of Encounter: 02/11/2020  Primary Cardiologist: Ida Rogue, MD  Subjective   Reports fatigue. Denies CP, racing HR, or palpitations. Feels improvement in breathing though still on [place] oxygen. No reported LEE, abdominal distention, or orthopnea.  Inpatient Medications    Scheduled Meds: . aspirin EC  81 mg Oral Daily  . atorvastatin  40 mg Oral Daily  . buPROPion  150 mg Oral Daily  . carvedilol  6.25 mg Oral BID WC  . dextromethorphan-guaiFENesin  1 tablet Oral BID  . enoxaparin (LOVENOX) injection  1 mg/kg Subcutaneous Q12H  . furosemide  40 mg Intravenous BID  . loratadine  10 mg Oral Daily  . nicotine  21 mg Transdermal Daily  . pantoprazole  40 mg Oral Daily  . sacubitril-valsartan  1 tablet Oral BID  . sodium chloride flush  3 mL Intravenous Q12H  . venlafaxine XR  150 mg Oral Q breakfast   Continuous Infusions: . sodium chloride     PRN Meds: sodium chloride, acetaminophen, albuterol, hydrALAZINE, ondansetron (ZOFRAN) IV, sodium chloride flush   Vital Signs    Vitals:   02/10/20 2300 02/11/20 0030 02/11/20 0330 02/11/20 0849  BP: (!) 149/91 134/78 (!) 142/71 121/82  Pulse: 93 87 85 92  Resp: (!) 22 20 20 18   Temp:      SpO2: 95% 94% 100% 98%  Weight:      Height:        Intake/Output Summary (Last 24 hours) at 02/11/2020 1209 Last data filed at 02/10/2020 1951 Gross per 24 hour  Intake --  Output 900 ml  Net -900 ml   Last 3 Weights 02/09/2020 12/06/2019 07/13/2019  Weight (lbs) 123 lb 124 lb 132 lb  Weight (kg) 55.792 kg 56.246 kg 59.875 kg      Telemetry    ST, brief previously reported AT - Personally Reviewed  ECG    No new tracings - Personally Reviewed  Physical Exam   GEN: No acute distress.   Neck: JVP ~10-11cm Cardiac: tachycardic but regular, no murmurs, rubs, or gallops.  Respiratory: Bibasilar crackles. GI: Soft, nontender, non-distended  MS: No edema; No deformity. Neuro:   Nonfocal  Psych: Normal affect   Labs    High Sensitivity Troponin:   Recent Labs  Lab 02/09/20 2216 02/10/20 0119 02/10/20 1141 02/10/20 1447 02/10/20 1759  TROPONINIHS 20* 20* 16 17 16       Cardiac EnzymesNo results for input(s): TROPONINI in the last 168 hours. No results for input(s): TROPIPOC in the last 168 hours.   Chemistry Recent Labs  Lab 02/09/20 2216 02/11/20 0404  NA 138 138  K 4.3 4.2  CL 107 103  CO2 22 25  GLUCOSE 135* 112*  BUN 26* 33*  CREATININE 1.42* 1.61*  CALCIUM 9.0 8.6*  PROT 7.0  --   ALBUMIN 3.8  --   AST 31  --   ALT 29  --   ALKPHOS 187*  --   BILITOT 0.9  --   GFRNONAA 39* 33*  GFRAA 45* 39*  ANIONGAP 9 10     Hematology Recent Labs  Lab 02/09/20 2024 02/11/20 0404  WBC 12.7* 11.1*  RBC 4.02 4.09  HGB 11.7* 11.9*  HCT 36.1 35.6*  MCV 89.8 87.0  MCH 29.1 29.1  MCHC 32.4 33.4  RDW 13.3 13.5  PLT 315 311    BNP Recent Labs  Lab 02/10/20 0521  BNP 3,345.9*     DDimer  No results for input(s): DDIMER in the last 168 hours.   Radiology    DG Chest 2 View  Result Date: 02/09/2020 CLINICAL DATA:  Shortness of breath EXAM: CHEST - 2 VIEW COMPARISON:  05/21/2006 FINDINGS: There is hyperinflation of the lungs compatible with COPD. Bibasilar linear densities, likely scarring. Platelike atelectasis or scarring in the left upper lobe. Cardiomegaly. No effusions or acute bony abnormality. Severe rightward scoliosis in the lower thoracic spine. IMPRESSION: COPD/chronic changes.  No active disease. Electronically Signed   By: Rolm Baptise M.D.   On: 02/09/2020 20:32   CT Angio Chest PE W and/or Wo Contrast  Result Date: 02/10/2020 CLINICAL DATA:  Hypoxemia EXAM: CT ANGIOGRAPHY CHEST WITH CONTRAST TECHNIQUE: Multidetector CT imaging of the chest was performed using the standard protocol during bolus administration of intravenous contrast. Multiplanar CT image reconstructions and MIPs were obtained to evaluate the vascular anatomy.  CONTRAST:  75mL OMNIPAQUE IOHEXOL 350 MG/ML SOLN COMPARISON:  None. FINDINGS: Cardiovascular: There is a optimal opacification of the pulmonary arteries. There is no central,segmental, or subsegmental filling defects within the pulmonary arteries. There is mild to moderate cardiomegaly present. No pericardial effusion or thickening. No evidence right heart strain. There is normal three-vessel brachiocephalic anatomy without proximal stenosis. Scattered mild aortic atherosclerosis is seen. Mediastinum/Nodes: No hilar, mediastinal, or axillary adenopathy. Thyroid gland, trachea, and esophagus demonstrate no significant findings. Lungs/Pleura: Interlobular septal thickening is seen throughout both lungs. There is small bilateral pleural effusions, right greater than left. Hazy patchy airspace opacity seen at both lung bases, right greater than left. Upper Abdomen: No acute abnormalities present in the visualized portions of the upper abdomen. Musculoskeletal: No chest wall abnormality. No acute or significant osseous findings. 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 Review of the MIP images confirms the above findings. IMPRESSION: No central, segmental, or subsegmental pulmonary embolism. Small bilateral pleural effusions, right greater than left. Findings consistent with interstitial edema. Ground-glass opacities at both lung bases right greater than left which may be due to asymmetric edema and/or atelectasis. Electronically Signed   By: Prudencio Pair M.D.   On: 02/10/2020 03:29   ECHOCARDIOGRAM COMPLETE  Result Date: 02/10/2020    ECHOCARDIOGRAM REPORT   Patient Name:   Dawn Sherman Date of Exam: 02/10/2020 Medical Rec #:  [id number]     Height:       65.0 in Accession #:    [id number]    Weight:       123.0 lb Date of Birth:  September 18, 1955    BSA:          1.609 m Patient Age:    64 years      BP:           133/89 mmHg Patient Gender: F             HR:           79 bpm. Exam Location:  [facility] Procedure:  2D Echo, Cardiac Doppler and Color Doppler Indications:     CHF- acute diastolic 720.94  History:         Patient has no prior history of Echocardiogram examinations.                  Risk Factors:Hypertension. Tobacco abuse.  Sonographer:     Sherrie Sport RDCS (AE) Referring Phys:  [id number] Soledad Gerlach NIU Diagnosing Phys: Ida Rogue MD IMPRESSIONS  1. Left ventricular ejection fraction, by estimation, is 30 to 35%. The left ventricle has moderately decreased  function. The left ventricle demonstrates global hypokinesis. The left ventricular internal cavity size was mildly dilated. Left ventricular diastolic parameters are consistent with Grade II diastolic dysfunction (pseudonormalization).  2. Right ventricular systolic function is normal. The right ventricular size is normal. There is mildly elevated pulmonary artery systolic pressure.  3. The mitral valve is normal in structure. Moderate mitral valve regurgitation. No evidence of mitral stenosis.  4. Tricuspid valve regurgitation is mild to moderate. FINDINGS  Left Ventricle: Left ventricular ejection fraction, by estimation, is 30 to 35%. The left ventricle has moderately decreased function. The left ventricle demonstrates global hypokinesis. The left ventricular internal cavity size was mildly dilated. There is no left ventricular hypertrophy. Left ventricular diastolic parameters are consistent with Grade II diastolic dysfunction (pseudonormalization). Right Ventricle: The right ventricular size is normal. No increase in right ventricular wall thickness. Right ventricular systolic function is normal. There is mildly elevated pulmonary artery systolic pressure. The tricuspid regurgitant velocity is 3.08  m/s, and with an assumed right atrial pressure of 10 mmHg, the estimated right ventricular systolic pressure is 62.7 mmHg. Left Atrium: Left atrial size was normal in size. Right Atrium: Right atrial size was normal in size. Pericardium: Trivial pericardial effusion  is present. Mitral Valve: The mitral valve is normal in structure. Normal mobility of the mitral valve leaflets. Moderate mitral valve regurgitation. No evidence of mitral valve stenosis. Tricuspid Valve: The tricuspid valve is normal in structure. Tricuspid valve regurgitation is mild to moderate. No evidence of tricuspid stenosis. Aortic Valve: The aortic valve is normal in structure. Aortic valve regurgitation is not visualized. No aortic stenosis is present. Aortic valve mean gradient measures 4.0 mmHg. Aortic valve peak gradient measures 7.2 mmHg. Aortic valve area, by VTI measures 2.92 cm. Pulmonic Valve: The pulmonic valve was normal in structure. Pulmonic valve regurgitation is not visualized. No evidence of pulmonic stenosis. Aorta: The aortic root is normal in size and structure. Venous: The inferior vena cava is normal in size with greater than 50% respiratory variability, suggesting right atrial pressure of 3 mmHg. IAS/Shunts: No atrial level shunt detected by color flow Doppler.  LEFT VENTRICLE PLAX 2D LVIDd:         5.33 cm      Diastology LVIDs:         4.47 cm      LV e' lateral:   7.83 cm/s LV PW:         1.17 cm      LV E/e' lateral: 17.5 LV IVS:        0.96 cm      LV e' medial:    7.29 cm/s LVOT diam:     2.20 cm      LV E/e' medial:  18.8 LV SV:         55 LV SV Index:   34 LVOT Area:     3.80 cm  LV Volumes (MOD) LV vol d, MOD A2C: 142.0 ml LV vol d, MOD A4C: 106.0 ml LV vol s, MOD A2C: 97.4 ml LV vol s, MOD A4C: 65.4 ml LV SV MOD A2C:     44.6 ml LV SV MOD A4C:     106.0 ml LV SV MOD BP:      41.9 ml RIGHT VENTRICLE RV Basal diam:  3.43 cm LEFT ATRIUM             Index       RIGHT ATRIUM  Index LA diam:        3.70 cm 2.30 cm/m  RA Area:     16.50 cm LA Vol (A2C):   97.4 ml 60.54 ml/m RA Volume:   49.60 ml  30.83 ml/m LA Vol (A4C):   51.2 ml 31.82 ml/m LA Biplane Vol: 77.6 ml 48.23 ml/m  AORTIC VALVE                   PULMONIC VALVE AV Area (Vmax):    2.18 cm    PV Vmax:         0.60 m/s AV Area (Vmean):   2.13 cm    PV Peak grad:   1.4 mmHg AV Area (VTI):     2.92 cm    RVOT Peak grad: 3 mmHg AV Vmax:           134.00 cm/s AV Vmean:          94.800 cm/s AV VTI:            0.188 m AV Peak Grad:      7.2 mmHg AV Mean Grad:      4.0 mmHg LVOT Vmax:         77.00 cm/s LVOT Vmean:        53.100 cm/s LVOT VTI:          0.145 m LVOT/AV VTI ratio: 0.77  AORTA Ao Root diam: 2.80 cm MITRAL VALVE                TRICUSPID VALVE MV Area (PHT): 6.83 cm     TR Peak grad:   37.9 mmHg MV Decel Time: 111 msec     TR Vmax:        308.00 cm/s MV E velocity: 137.00 cm/s MV A velocity: 126.00 cm/s  SHUNTS MV E/A ratio:  1.09         Systemic VTI:  0.14 m                             Systemic Diam: 2.20 cm Ida Rogue MD Electronically signed by Ida Rogue MD Signature Date/Time: 02/10/2020/4:07:46 PM    Final     Cardiac Studies   Echo 02/10/2020 1. Left ventricular ejection fraction, by estimation, is 30 to 35%. The  left ventricle has moderately decreased function. The left ventricle  demonstrates global hypokinesis. The left ventricular internal cavity size  was mildly dilated. Left ventricular  diastolic parameters are consistent with Grade II diastolic dysfunction  (pseudonormalization).  2. Right ventricular systolic function is normal. The right ventricular  size is normal. There is mildly elevated pulmonary artery systolic  pressure.  3. The mitral valve is normal in structure. Moderate mitral valve  regurgitation. No evidence of mitral stenosis.  4. Tricuspid valve regurgitation is mild to moderate.   Patient Profile     64 y.o. female with history of HFrEF, PAF, HTN, HLD, tobacco use, CKD III, prior EtOH abuse, and depression, and who is being seen today for the evaluation of progressive dyspnea and CHF.  Assessment & Plan    Acute HFrEF --Reports improved breathing. --Echo as above shows EF reduced 30-35%.  --Lexiscan to be performed today for further  ischemic evaluation, given reduced EF as above.   --As previously noted, patient prefers to defer catheterization and opts for noninvasive workup.  --Also considered is NICM and with consideration of PAF as noted on telemetry and which appears  a new diagnosis. Also considered is hypertensive CM. --Still volume up on exam.  --Continue IV lasix and titrate until euvolemic on exam or bump in renal function with goal net output 2L daily.  --Continue with Coreg and Entresto (olmesartan on allergy list reviewed with patient and unclear why on her allergy list).  --Monitor I/Os, daily standing weights. Daily BMET. --Further recommendations pending Lexiscan results.  PAF --As indicted in consult note, ST on arrival with runs of PAF.  --Currently ST with rates 90s-100s. Denies any feelings of racing HR or palpitations. --Continue to monitor on telemetry. --Echo as above with reduced EF. Consider tachycardia induced CM.  --Continue rate control with Coreg and titrate as BP allows with goal HR below 110bpm.  --CHA2DS2VASc score of at least  3 with recommendation for long term [place]. Continue anticoagulation dose of Lovenox. Per review of chart, she will be discharged with Eliquis. --No indication for cardioversion at this time, given in NSR/ST.  HS Tn --No CP. Mildly elevated and flat trending HS Tn. CTA showed mild aortic atherosclerosis.  --Echo showed reduced EF with further ischemic workup with Lexiscan pending results.  --Continue ASA and statin. Further recommendations pending Lexiscan results.  As previously noted, patient prefers to defer catheterization and opts for noninvasive workup.  HTN --Continue Coreg and titrate as needed for optimal BP control.  Tobacco use / COPD --Cessation advised. Continue patch.  For questions or updates, please contact [place] Please consult [email or web address] for contact info under        Signed, Arvil Chaco, PA-C  02/11/2020, 12:09 PM

## 2020-02-11 NOTE — Progress Notes (Signed)
PROGRESS NOTE    Dawn Sherman  XKG:[id number]  DOB: April 06, 1956  PCP: [name], MD Admit date:02/10/2020 65 y.o. female with medical history significant of hypertension, hyperlipidemia, GERD, depression, endometriosis, CKD stage III, alcohol abuse in remission, tobacco abuse, who presents with shortness of breath x 3 days. ED Course: Afebrile,Patient has oxygen desaturation to 90% on room air, which improved to 97% on 3 L  found to have BNP 3345.9, troponin 20 -->20, negative COVID-19 PCR, negative urinalysis, lactic acid of 0.7, stable renal function, temperature normal, blood pressure 143/73, tachycardia, RR 23.  Chest x-ray showed COPD without infiltration.  CT angiogram was negative for PE, but showed interstitial pulmonary edema, and bilateral small pleural effusion.   Hospital course: Patient admitted to [facility] for acute CHF management with Dr. Rockey Situ for cards consulted.  Subjective:  Patient seen by cardiology this morning and underwent stress test.  Return to the room from the stress test and reports feeling improved.  On O2 nasal cannula but not connected to the wall unit yet.  Husband at bedside.  Objective: Vitals:   02/10/20 2300 02/11/20 0030 02/11/20 0330 02/11/20 0849  BP: (!) 149/91 134/78 (!) 142/71 121/82  Pulse: 93 87 85 92  Resp: (!) 22 20 20 18   Temp:      SpO2: 95% 94% 100% 98%  Weight:      Height:        Intake/Output Summary (Last 24 hours) at 02/11/2020 1008 Last data filed at 02/10/2020 1951 Gross per 24 hour  Intake --  Output 900 ml  Net -900 ml   Filed Weights   02/09/20 2015  Weight: 55.8 kg    Physical Examination:  General exam: Appears in mild distress on exertion Respiratory system: Decreased breath sounds at bases respiratory effort mildly increased on exertion. Cardiovascular system: S1 & S2 heard, RRR. No JVD, murmurs, rubs, gallops or clicks. No pedal edema. Gastrointestinal system: Abdomen is nondistended, soft and nontender.  Normal bowel sounds heard. Central nervous system: Alert and oriented. No new focal neurological deficits. Extremities: No contractures, edema or joint deformities.  Skin: No rashes, lesions or ulcers Psychiatry: Judgement and insight appear normal. Mood & affect appropriate.   Data Reviewed: I have personally reviewed following labs and imaging studies  CBC: Recent Labs  Lab 02/09/20 2024 02/11/20 0404  WBC 12.7* 11.1*  NEUTROABS 9.7*  --   HGB 11.7* 11.9*  HCT 36.1 35.6*  MCV 89.8 87.0  PLT 315 702   Basic Metabolic Panel: Recent Labs  Lab 02/09/20 2216 02/11/20 0404  NA 138 138  K 4.3 4.2  CL 107 103  CO2 22 25  GLUCOSE 135* 112*  BUN 26* 33*  CREATININE 1.42* 1.61*  CALCIUM 9.0 8.6*  MG  --  1.7   GFR: Estimated Creatinine Clearance: 31.1 mL/min (A) (by C-G formula based on SCr of 1.61 mg/dL (H)). Liver Function Tests: Recent Labs  Lab 02/09/20 2216  AST 31  ALT 29  ALKPHOS 187*  BILITOT 0.9  PROT 7.0  ALBUMIN 3.8   No results for input(s): LIPASE, AMYLASE in the last 168 hours. No results for input(s): AMMONIA in the last 168 hours. Coagulation Profile: No results for input(s): INR, PROTIME in the last 168 hours. Cardiac Enzymes: No results for input(s): CKTOTAL, CKMB, CKMBINDEX, TROPONINI in the last 168 hours. BNP (last 3 results) No results for input(s): PROBNP in the last 8760 hours. HbA1C: No results for input(s): HGBA1C in the last 72 hours. CBG:  No results for input(s): GLUCAP in the last 168 hours. Lipid Profile: Recent Labs    02/11/20 0404  CHOL 209*  HDL 90  LDLCALC 107*  TRIG 62  CHOLHDL 2.3   Thyroid Function Tests: No results for input(s): TSH, T4TOTAL, FREET4, T3FREE, THYROIDAB in the last 72 hours. Anemia Panel: No results for input(s): VITAMINB12, FOLATE, FERRITIN, TIBC, IRON, RETICCTPCT in the last 72 hours. Sepsis Labs: Recent Labs  Lab 02/09/20 2024  LATICACIDVEN 0.7    Recent Results (from the past 240 hour(s))   SARS Coronavirus 2 by RT PCR (hospital order, performed in [facility] hospital lab) Nasopharyngeal Nasopharyngeal Swab     Status: None   Collection Time: 02/10/20  2:42 AM   Specimen: Nasopharyngeal Swab  Result Value Ref Range Status   SARS Coronavirus 2 NEGATIVE NEGATIVE Final    Comment: (NOTE) SARS-CoV-2 target nucleic acids are NOT DETECTED.  The SARS-CoV-2 RNA is generally detectable in upper and lower respiratory specimens during the acute phase of infection. The lowest concentration of SARS-CoV-2 viral copies this assay can detect is 250 copies / mL. A negative result does not preclude SARS-CoV-2 infection and should not be used as the sole basis for treatment or other patient management decisions.  A negative result may occur with improper specimen collection / handling, submission of specimen other than nasopharyngeal swab, presence of viral mutation(s) within the areas targeted by this assay, and inadequate number of viral copies (<250 copies / mL). A negative result must be combined with clinical observations, patient history, and epidemiological information.  Fact Sheet for Patients:   StrictlyIdeas.no  Fact Sheet for Healthcare Providers: BankingDealers.co.za  This test is not yet approved or  cleared by the [place] FDA and has been authorized for detection and/or diagnosis of SARS-CoV-2 by FDA under an Emergency Use Authorization (EUA).  This EUA will remain in effect (meaning this test can be used) for the duration of the COVID-19 declaration under Section 564(b)(1) of the Act, 21 U.S.C. section 360bbb-3(b)(1), unless the authorization is terminated or revoked sooner.  Performed at [facility], [address], [place], [place] [address]       Radiology Studies: DG Chest 2 View  Result Date: 02/09/2020 CLINICAL DATA:  Shortness of breath EXAM: CHEST - 2 VIEW COMPARISON:  05/21/2006 FINDINGS:  There is hyperinflation of the lungs compatible with COPD. Bibasilar linear densities, likely scarring. Platelike atelectasis or scarring in the left upper lobe. Cardiomegaly. No effusions or acute bony abnormality. Severe rightward scoliosis in the lower thoracic spine. IMPRESSION: COPD/chronic changes.  No active disease. Electronically Signed   By: Rolm Baptise M.D.   On: 02/09/2020 20:32   CT Angio Chest PE W and/or Wo Contrast  Result Date: 02/10/2020 CLINICAL DATA:  Hypoxemia EXAM: CT ANGIOGRAPHY CHEST WITH CONTRAST TECHNIQUE: Multidetector CT imaging of the chest was performed using the standard protocol during bolus administration of intravenous contrast. Multiplanar CT image reconstructions and MIPs were obtained to evaluate the vascular anatomy. CONTRAST:  69mL OMNIPAQUE IOHEXOL 350 MG/ML SOLN COMPARISON:  None. FINDINGS: Cardiovascular: There is a optimal opacification of the pulmonary arteries. There is no central,segmental, or subsegmental filling defects within the pulmonary arteries. There is mild to moderate cardiomegaly present. No pericardial effusion or thickening. No evidence right heart strain. There is normal three-vessel brachiocephalic anatomy without proximal stenosis. Scattered mild aortic atherosclerosis is seen. Mediastinum/Nodes: No hilar, mediastinal, or axillary adenopathy. Thyroid gland, trachea, and esophagus demonstrate no significant findings. Lungs/Pleura: Interlobular septal thickening  is seen throughout both lungs. There is small bilateral pleural effusions, right greater than left. Hazy patchy airspace opacity seen at both lung bases, right greater than left. Upper Abdomen: No acute abnormalities present in the visualized portions of the upper abdomen. Musculoskeletal: No chest wall abnormality. No acute or significant osseous findings. 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 Review of the MIP images confirms the above findings. IMPRESSION: No central,  segmental, or subsegmental pulmonary embolism. Small bilateral pleural effusions, right greater than left. Findings consistent with interstitial edema. Ground-glass opacities at both lung bases right greater than left which may be due to asymmetric edema and/or atelectasis. Electronically Signed   By: Prudencio Pair M.D.   On: 02/10/2020 03:29   ECHOCARDIOGRAM COMPLETE  Result Date: 02/10/2020    ECHOCARDIOGRAM REPORT   Patient Name:   ELYSSE POLIDORE Date of Exam: 02/10/2020 Medical Rec #:  [id number]     Height:       65.0 in Accession #:    [id number]    Weight:       123.0 lb Date of Birth:  08/22/1956    BSA:          1.609 m Patient Age:    16 years      BP:           133/89 mmHg Patient Gender: F             HR:           79 bpm. Exam Location:  [facility] Procedure: 2D Echo, Cardiac Doppler and Color Doppler Indications:     CHF- acute diastolic 220.25  History:         Patient has no prior history of Echocardiogram examinations.                  Risk Factors:Hypertension. Tobacco abuse.  Sonographer:     Sherrie Sport RDCS (AE) Referring Phys:  [id number] Soledad Gerlach NIU Diagnosing Phys: Ida Rogue MD IMPRESSIONS  1. Left ventricular ejection fraction, by estimation, is 30 to 35%. The left ventricle has moderately decreased function. The left ventricle demonstrates global hypokinesis. The left ventricular internal cavity size was mildly dilated. Left ventricular diastolic parameters are consistent with Grade II diastolic dysfunction (pseudonormalization).  2. Right ventricular systolic function is normal. The right ventricular size is normal. There is mildly elevated pulmonary artery systolic pressure.  3. The mitral valve is normal in structure. Moderate mitral valve regurgitation. No evidence of mitral stenosis.  4. Tricuspid valve regurgitation is mild to moderate. FINDINGS  Left Ventricle: Left ventricular ejection fraction, by estimation, is 30 to 35%. The left ventricle has moderately decreased function. The left  ventricle demonstrates global hypokinesis. The left ventricular internal cavity size was mildly dilated. There is no left ventricular hypertrophy. Left ventricular diastolic parameters are consistent with Grade II diastolic dysfunction (pseudonormalization). Right Ventricle: The right ventricular size is normal. No increase in right ventricular wall thickness. Right ventricular systolic function is normal. There is mildly elevated pulmonary artery systolic pressure. The tricuspid regurgitant velocity is 3.08  m/s, and with an assumed right atrial pressure of 10 mmHg, the estimated right ventricular systolic pressure is 62.3 mmHg. Left Atrium: Left atrial size was normal in size. Right Atrium: Right atrial size was normal in size. Pericardium: Trivial pericardial effusion is present. Mitral Valve: The mitral valve is normal in structure. Normal mobility of the mitral valve leaflets. Moderate mitral valve regurgitation. No evidence of mitral valve stenosis. Tricuspid Valve: The  tricuspid valve is normal in structure. Tricuspid valve regurgitation is mild to moderate. No evidence of tricuspid stenosis. Aortic Valve: The aortic valve is normal in structure. Aortic valve regurgitation is not visualized. No aortic stenosis is present. Aortic valve mean gradient measures 4.0 mmHg. Aortic valve peak gradient measures 7.2 mmHg. Aortic valve area, by VTI measures 2.92 cm. Pulmonic Valve: The pulmonic valve was normal in structure. Pulmonic valve regurgitation is not visualized. No evidence of pulmonic stenosis. Aorta: The aortic root is normal in size and structure. Venous: The inferior vena cava is normal in size with greater than 50% respiratory variability, suggesting right atrial pressure of 3 mmHg. IAS/Shunts: No atrial level shunt detected by color flow Doppler.  LEFT VENTRICLE PLAX 2D LVIDd:         5.33 cm      Diastology LVIDs:         4.47 cm      LV e' lateral:   7.83 cm/s LV PW:         1.17 cm      LV E/e'  lateral: 17.5 LV IVS:        0.96 cm      LV e' medial:    7.29 cm/s LVOT diam:     2.20 cm      LV E/e' medial:  18.8 LV SV:         55 LV SV Index:   34 LVOT Area:     3.80 cm  LV Volumes (MOD) LV vol d, MOD A2C: 142.0 ml LV vol d, MOD A4C: 106.0 ml LV vol s, MOD A2C: 97.4 ml LV vol s, MOD A4C: 65.4 ml LV SV MOD A2C:     44.6 ml LV SV MOD A4C:     106.0 ml LV SV MOD BP:      41.9 ml RIGHT VENTRICLE RV Basal diam:  3.43 cm LEFT ATRIUM             Index       RIGHT ATRIUM           Index LA diam:        3.70 cm 2.30 cm/m  RA Area:     16.50 cm LA Vol (A2C):   97.4 ml 60.54 ml/m RA Volume:   49.60 ml  30.83 ml/m LA Vol (A4C):   51.2 ml 31.82 ml/m LA Biplane Vol: 77.6 ml 48.23 ml/m  AORTIC VALVE                   PULMONIC VALVE AV Area (Vmax):    2.18 cm    PV Vmax:        0.60 m/s AV Area (Vmean):   2.13 cm    PV Peak grad:   1.4 mmHg AV Area (VTI):     2.92 cm    RVOT Peak grad: 3 mmHg AV Vmax:           134.00 cm/s AV Vmean:          94.800 cm/s AV VTI:            0.188 m AV Peak Grad:      7.2 mmHg AV Mean Grad:      4.0 mmHg LVOT Vmax:         77.00 cm/s LVOT Vmean:        53.100 cm/s LVOT VTI:          0.145 m LVOT/AV VTI ratio: 0.77  AORTA  Ao Root diam: 2.80 cm MITRAL VALVE                TRICUSPID VALVE MV Area (PHT): 6.83 cm     TR Peak grad:   37.9 mmHg MV Decel Time: 111 msec     TR Vmax:        308.00 cm/s MV E velocity: 137.00 cm/s MV A velocity: 126.00 cm/s  SHUNTS MV E/A ratio:  1.09         Systemic VTI:  0.14 m                             Systemic Diam: 2.20 cm Ida Rogue MD Electronically signed by Ida Rogue MD Signature Date/Time: 02/10/2020/4:07:46 PM    Final         Scheduled Meds: . aspirin EC  81 mg Oral Daily  . atorvastatin  40 mg Oral Daily  . buPROPion  150 mg Oral Daily  . carvedilol  6.25 mg Oral BID WC  . dextromethorphan-guaiFENesin  1 tablet Oral BID  . enoxaparin (LOVENOX) injection  1 mg/kg Subcutaneous Q12H  . furosemide  40 mg Intravenous BID  .  loratadine  10 mg Oral Daily  . nicotine  21 mg Transdermal Daily  . pantoprazole  40 mg Oral Daily  . sacubitril-valsartan  1 tablet Oral BID  . sodium chloride flush  3 mL Intravenous Q12H  . venlafaxine XR  150 mg Oral Q breakfast   Continuous Infusions: . sodium chloride       Assessment/Plan:  Acute hypoxic respiratory failure due to new onset acute combined CHF:  elevated BNP 3345. Echo shows EF 24-40%, grade 2 diastolic dysfunction.  Seen by Cardiology, Dr. Rockey Situ and underwent stress test this morning which apparently was unremarkable.  She however has significantly low EF with no clear etiology other than A. fib with RVR noted on telemetry.  Cardiology discussing with her regarding cardiac cath-she is reluctant.  On IV Lasix 40 mg bid  (patient received 60 mg of Lasix in ED)-renal function somewhat worsening..Daily weights,strict I/O's.Low salt diet.Fluid restriction.  Cardiology titrating IV diuretic dosage and contemplating initiating Entresto and other cardiac modeling agents.  Paroxysmal atrial fibrillation: Noted on telemetry and cardiology has started her on Coreg as well as Eliquis given CHA2DS2-VASc score of 3  Elevated troponin: no CP. Trop 20 -->20 -->16 -->17. No CP.  Likely due to demand ischemia secondary CHF/A. fib. On ASA. Cardiology considering further ischemic evaluation as discussed above.  HTN: -Continue home medications: Amlodipine,hydralazine prn  CKD , stage IIIa: Stable with creat 1.3 to 1.6 at baseline-Monitor BMP in the setting of daily diuretics.  Will likely need a stable renal function before starting Entresto.  Leukocytosis: WBC 12.7.  No fever, no source of infection identified.  Likely reactive.Follow-up with CBC shows downtrend  HLD : Patient is not taking medications at home-FLP with LDL 107, HDL/TG  okay   GERD -Protonix  Tobacco abuse-Nicotine patch  DVT prophylaxis: Lovenox Code Status: full code Family / Patient Communication:  Discussed with patient and husband at bedside Disposition Plan:   Status is: Inpatient  Remains inpatient appropriate because:IV treatments appropriate due to intensity of illness or inability to take PO   Dispo: The patient is from: Home              Anticipated d/c is to: Home  Anticipated d/c date is: 2 days              Patient currently is not medically stable to d/c.         LOS: 1 day    Time spent:     Guilford Shi, MD [facility] Pager in [place]  If 7PM-7AM, please contact night-coverage [email or web address] 02/11/2020, 10:08 AM

## 2020-02-12 DIAGNOSIS — I5041 Acute combined systolic (congestive) and diastolic (congestive) heart failure: Secondary | ICD-10-CM

## 2020-02-12 LAB — BASIC METABOLIC PANEL
Anion gap: 10 (ref 5–15)
BUN: 37 mg/dL — ABNORMAL HIGH (ref 8–23)
CO2: 29 mmol/L (ref 22–32)
Calcium: 8.9 mg/dL (ref 8.9–10.3)
Chloride: 99 mmol/L (ref 98–111)
Creatinine, Ser: 2.03 mg/dL — ABNORMAL HIGH (ref 0.44–1.00)
GFR calc Af Amer: 29 mL/min — ABNORMAL LOW (ref >60)
GFR calc non Af Amer: 25 mL/min — ABNORMAL LOW (ref >60)
Glucose, Bld: 114 mg/dL — ABNORMAL HIGH (ref 70–99)
Potassium: 4.1 mmol/L (ref 3.5–5.1)
Sodium: 138 mmol/L (ref 135–145)

## 2020-02-12 MED ORDER — ENOXAPARIN SODIUM 60 MG/0.6ML ~~LOC~~ SOLN
1.0000 mg/kg | SUBCUTANEOUS | Status: DC
  Administered 2020-02-13: 55 mg via SUBCUTANEOUS
  Filled 2020-02-12: qty 0.6

## 2020-02-12 NOTE — Progress Notes (Signed)
Progress Note  Patient Name: Dawn Sherman Date of Encounter: 02/12/2020  Primary Cardiologist:   Ida Rogue, MD   Subjective   Breathing much better.  Probably at baseline  Inpatient Medications    Scheduled Meds: . aspirin EC  81 mg Oral Daily  . atorvastatin  40 mg Oral Daily  . buPROPion  150 mg Oral Daily  . carvedilol  12.5 mg Oral BID WC  . dextromethorphan-guaiFENesin  1 tablet Oral BID  . enoxaparin (LOVENOX) injection  1 mg/kg Subcutaneous Q12H  . furosemide  40 mg Oral Daily  . loratadine  10 mg Oral Daily  . nicotine  21 mg Transdermal Daily  . pantoprazole  40 mg Oral Daily  . pneumococcal 23 valent vaccine  0.5 mL Intramuscular Tomorrow-1000  . sacubitril-valsartan  1 tablet Oral BID  . sodium chloride flush  3 mL Intravenous Q12H  . venlafaxine XR  150 mg Oral Q breakfast   Continuous Infusions: . sodium chloride     PRN Meds: sodium chloride, acetaminophen, albuterol, hydrALAZINE, ondansetron (ZOFRAN) IV, sodium chloride flush   Vital Signs    Vitals:   02/11/20 1945 02/11/20 2028 02/12/20 0420 02/12/20 0748  BP: (!) 101/51  (!) 108/43 128/74  Pulse: 72  81 83  Resp: 18  20 16   Temp:   97.7 F (36.5 C) 97.6 F (36.4 C)  TempSrc:   Oral   SpO2: (!) 86% 93% 94% 95%  Weight:   55 kg   Height:        Intake/Output Summary (Last 24 hours) at 02/12/2020 0830 Last data filed at 02/11/2020 1500 Gross per 24 hour  Intake --  Output 700 ml  Net -700 ml   Filed Weights   02/09/20 2015 02/11/20 1251 02/12/20 0420  Weight: 55.8 kg 55.7 kg 55 kg    Telemetry    NSR - Personally Reviewed  ECG    NA - Personally Reviewed  Physical Exam   GEN: No acute distress.   Neck: No  JVD Cardiac: RRR, no murmurs, rubs, or gallops.  Respiratory: Clear to auscultation bilaterally. GI: Soft, nontender, non-distended  MS: No edema; No deformity. Neuro:  Nonfocal  Psych: Normal affect   Labs    Chemistry Recent Labs  Lab 02/09/20 2216  02/11/20 0404 02/12/20 0521  NA 138 138 138  K 4.3 4.2 4.1  CL 107 103 99  CO2 22 25 29   GLUCOSE 135* 112* 114*  BUN 26* 33* 37*  CREATININE 1.42* 1.61* 2.03*  CALCIUM 9.0 8.6* 8.9  PROT 7.0  --   --   ALBUMIN 3.8  --   --   AST 31  --   --   ALT 29  --   --   ALKPHOS 187*  --   --   BILITOT 0.9  --   --   GFRNONAA 39* 33* 25*  GFRAA 45* 39* 29*  ANIONGAP 9 10 10      Hematology Recent Labs  Lab 02/09/20 2024 02/11/20 0404  WBC 12.7* 11.1*  RBC 4.02 4.09  HGB 11.7* 11.9*  HCT 36.1 35.6*  MCV 89.8 87.0  MCH 29.1 29.1  MCHC 32.4 33.4  RDW 13.3 13.5  PLT 315 311    Cardiac EnzymesNo results for input(s): TROPONINI in the last 168 hours. No results for input(s): TROPIPOC in the last 168 hours.   BNP Recent Labs  Lab 02/10/20 0521  BNP 3,345.9*     DDimer No results for  input(s): DDIMER in the last 168 hours.   Radiology    NM Myocar Multi W/Spect W/Wall Motion / EF  Result Date: 02/11/2020 Pharmacological myocardial perfusion imaging study with no significant  Ischemia Thinning in the anterior wall, fixed, likely secondary to processing error and significant GI uptake artifact Global hypokinesis,  EF estimated at 24% No EKG changes concerning for ischemia at peak stress or in recovery. CT attenuation correction images with no significant coronary calcification, no aortic atherosclerosis Moderate risk scan given severely depressed ejection fraction Signed, Esmond Plants, MD, Ph.D [facility]   ECHOCARDIOGRAM COMPLETE  Result Date: 02/10/2020    ECHOCARDIOGRAM REPORT   Patient Name:   Dawn Sherman Date of Exam: 02/10/2020 Medical Rec #:  [id number]     Height:       65.0 in Accession #:    [id number]    Weight:       123.0 lb Date of Birth:  10-26-1955    BSA:          1.609 m Patient Age:    64 years      BP:           133/89 mmHg Patient Gender: F             HR:           79 bpm. Exam Location:  [facility] Procedure: 2D Echo, Cardiac Doppler and Color Doppler  Indications:     CHF- acute diastolic 403.47  History:         Patient has no prior history of Echocardiogram examinations.                  Risk Factors:Hypertension. Tobacco abuse.  Sonographer:     Sherrie Sport RDCS (AE) Referring Phys:  [id number] Soledad Gerlach NIU Diagnosing Phys: Ida Rogue MD IMPRESSIONS  1. Left ventricular ejection fraction, by estimation, is 30 to 35%. The left ventricle has moderately decreased function. The left ventricle demonstrates global hypokinesis. The left ventricular internal cavity size was mildly dilated. Left ventricular diastolic parameters are consistent with Grade II diastolic dysfunction (pseudonormalization).  2. Right ventricular systolic function is normal. The right ventricular size is normal. There is mildly elevated pulmonary artery systolic pressure.  3. The mitral valve is normal in structure. Moderate mitral valve regurgitation. No evidence of mitral stenosis.  4. Tricuspid valve regurgitation is mild to moderate. FINDINGS  Left Ventricle: Left ventricular ejection fraction, by estimation, is 30 to 35%. The left ventricle has moderately decreased function. The left ventricle demonstrates global hypokinesis. The left ventricular internal cavity size was mildly dilated. There is no left ventricular hypertrophy. Left ventricular diastolic parameters are consistent with Grade II diastolic dysfunction (pseudonormalization). Right Ventricle: The right ventricular size is normal. No increase in right ventricular wall thickness. Right ventricular systolic function is normal. There is mildly elevated pulmonary artery systolic pressure. The tricuspid regurgitant velocity is 3.08  m/s, and with an assumed right atrial pressure of 10 mmHg, the estimated right ventricular systolic pressure is 56.3 mmHg. Left Atrium: Left atrial size was normal in size. Right Atrium: Right atrial size was normal in size. Pericardium: Trivial pericardial effusion is present. Mitral Valve: The mitral valve  is normal in structure. Normal mobility of the mitral valve leaflets. Moderate mitral valve regurgitation. No evidence of mitral valve stenosis. Tricuspid Valve: The tricuspid valve is normal in structure. Tricuspid valve regurgitation is mild to moderate. No evidence of tricuspid stenosis. Aortic Valve: The aortic valve is normal in  structure. Aortic valve regurgitation is not visualized. No aortic stenosis is present. Aortic valve mean gradient measures 4.0 mmHg. Aortic valve peak gradient measures 7.2 mmHg. Aortic valve area, by VTI measures 2.92 cm. Pulmonic Valve: The pulmonic valve was normal in structure. Pulmonic valve regurgitation is not visualized. No evidence of pulmonic stenosis. Aorta: The aortic root is normal in size and structure. Venous: The inferior vena cava is normal in size with greater than 50% respiratory variability, suggesting right atrial pressure of 3 mmHg. IAS/Shunts: No atrial level shunt detected by color flow Doppler.  LEFT VENTRICLE PLAX 2D LVIDd:         5.33 cm      Diastology LVIDs:         4.47 cm      LV e' lateral:   7.83 cm/s LV PW:         1.17 cm      LV E/e' lateral: 17.5 LV IVS:        0.96 cm      LV e' medial:    7.29 cm/s LVOT diam:     2.20 cm      LV E/e' medial:  18.8 LV SV:         55 LV SV Index:   34 LVOT Area:     3.80 cm  LV Volumes (MOD) LV vol d, MOD A2C: 142.0 ml LV vol d, MOD A4C: 106.0 ml LV vol s, MOD A2C: 97.4 ml LV vol s, MOD A4C: 65.4 ml LV SV MOD A2C:     44.6 ml LV SV MOD A4C:     106.0 ml LV SV MOD BP:      41.9 ml RIGHT VENTRICLE RV Basal diam:  3.43 cm LEFT ATRIUM             Index       RIGHT ATRIUM           Index LA diam:        3.70 cm 2.30 cm/m  RA Area:     16.50 cm LA Vol (A2C):   97.4 ml 60.54 ml/m RA Volume:   49.60 ml  30.83 ml/m LA Vol (A4C):   51.2 ml 31.82 ml/m LA Biplane Vol: 77.6 ml 48.23 ml/m  AORTIC VALVE                   PULMONIC VALVE AV Area (Vmax):    2.18 cm    PV Vmax:        0.60 m/s AV Area (Vmean):   2.13 cm     PV Peak grad:   1.4 mmHg AV Area (VTI):     2.92 cm    RVOT Peak grad: 3 mmHg AV Vmax:           134.00 cm/s AV Vmean:          94.800 cm/s AV VTI:            0.188 m AV Peak Grad:      7.2 mmHg AV Mean Grad:      4.0 mmHg LVOT Vmax:         77.00 cm/s LVOT Vmean:        53.100 cm/s LVOT VTI:          0.145 m LVOT/AV VTI ratio: 0.77  AORTA Ao Root diam: 2.80 cm MITRAL VALVE                TRICUSPID VALVE MV Area (  PHT): 6.83 cm     TR Peak grad:   37.9 mmHg MV Decel Time: 111 msec     TR Vmax:        308.00 cm/s MV E velocity: 137.00 cm/s MV A velocity: 126.00 cm/s  SHUNTS MV E/A ratio:  1.09         Systemic VTI:  0.14 m                             Systemic Diam: 2.20 cm Ida Rogue MD Electronically signed by Ida Rogue MD Signature Date/Time: 02/10/2020/4:07:46 PM    Final     Cardiac Studies   Lexiscan Myoview:  Pharmacological myocardial perfusion imaging study with no significant  Ischemia Thinning in the anterior wall, fixed, likely secondary to processing error and significant GI uptake artifact Global hypokinesis,  EF estimated at 24% No EKG changes concerning for ischemia at peak stress or in recovery. CT attenuation correction images with no significant coronary calcification, no aortic atherosclerosis Moderate risk scan given severely depressed ejection fraction  Patient Profile     64 y.o. female  with a history of HTN, HL, Tob Abuse, CKD III, prior etoh abuse, and depression, who is being seen for the evaluation of progressive dyspnea and CHF at the request of Dr. Blaine Hamper.  Assessment & Plan    ACUTE SYSTOLIC HF:    Norvasc started.  Changed to Coreg.  Entresto started.   On PO Lasix.  Net negative 2600 cc..  Creat is up to 2.03 from 1.61. [place] with no ischemia.  Artifact without clear infarct.  Hold further diuresis for now.  OK to give the [facility] but we might need to hold this if creat continues to rise   HTN:    This is being managed in the context of treating  his CHF     PAF:    On Eliquis.     CKD IIIB:  Creat is increased from yesterday.  See above.     For questions or updates, please contact [place] Please consult [email or web address] for contact info under Cardiology/STEMI.   Signed, Minus Breeding, MD  02/12/2020, 8:30 AM

## 2020-02-12 NOTE — Progress Notes (Signed)
Brief Pharmacy Note  Enoxaparin 55 mg q12h adjusted to q24h for decline in renal function with CrCl < 30 ml/min.  Dorena Bodo, PharmD

## 2020-02-12 NOTE — Progress Notes (Signed)
[facility]                                                                              Patient Demographics  Dawn Sherman, is a 64 y.o. female, DOB - [birth year], ZYS:[id number]  Admit date - 02/10/2020   Admitting Physician Ivor Costa, MD  Outpatient Primary MD for the patient is [name], MD  Outpatient specialists:   LOS - 2  days   Medical records reviewed and are as summarized below:    Chief Complaint  Patient presents with  . Shortness of Breath       Brief summary   Admit date:02/10/2020 65 y.o.femalewith medical history significant ofhypertension, hyperlipidemia, GERD, depression, endometriosis, CKD stage III, alcohol abuse in remission, tobacco abuse, who presents with shortness of breath x 3 days. ED Course: Afebrile,Patient has oxygen desaturation to 90% on room air, which improved to 97% on 3 L  found to have BNP 3345.9, troponin20 -->20,negative COVID-19 PCR, negative urinalysis, lactic acid of 0.7, stable renal function, temperature normal, blood pressure 143/73, tachycardia, RR 23.Chest x-ray showed COPD without infiltration. CT angiogram was negative for PE, but showed interstitial pulmonary edema, and bilateral small pleural effusion. Hospital course: Patient admitted to [facility] for acute CHF management with Dr. Rockey Situ for cards consulted.   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  Lasix placed on hold  Active Problems:  Paroxysmal atrial fibrillation -Rate currently controlled, on Coreg - CHADS Vasc 3, start on Eliquis  Elevated troponin -Likely due to demand ischemia due to #1, -Nuclear medicine stress test showed low EF but no reversible ischemia  Essential hypertension BP currently soft   Acute on chronic CKD stage III -Baseline creatinine 1.3-1.6 -Creatinine however trending up due to diuresis, now started on Entresto -Monitor closely  Leukocytosis -Likely reactive, no fever, no source of infection identified. -WBC count trending down  Hyperlipidemia -Not taking medications at home LDL 107  GERD -Continue PPI  Tobacco use Continue nicotine patch   Code Status: Full code DVT Prophylaxis:  Lovenox  Family Communication: Discussed all imaging results, lab results, explained to the patient   Disposition Plan:     Status is: Inpatient  Remains inpatient appropriate because:Inpatient level of care appropriate due to severity of illness   Dispo: The patient is from: Home              Anticipated d/c is to: Home              Anticipated d/c date is: 2 days  Patient currently is not medically stable to d/c.,  Creatinine still trending up, 2.0, await cardiology clearance      Time Spent in minutes 35 minutes  Procedures:  None  Consultants:   Cardiology  Antimicrobials:   Anti-infectives (From admission, onward)   None          Medications  Scheduled Meds: . aspirin EC  81 mg Oral Daily  . atorvastatin  40 mg Oral Daily  . buPROPion  150 mg Oral Daily  . carvedilol  12.5 mg Oral BID WC  . dextromethorphan-guaiFENesin  1 tablet Oral BID  . [START ON 02/13/2020] enoxaparin (LOVENOX) injection  1 mg/kg Subcutaneous Q24H  . loratadine  10 mg Oral Daily  . nicotine  21 mg Transdermal Daily  . pantoprazole  40 mg Oral Daily  . pneumococcal 23 valent vaccine  0.5 mL Intramuscular  Tomorrow-1000  . sacubitril-valsartan  1 tablet Oral BID  . sodium chloride flush  3 mL Intravenous Q12H  . venlafaxine XR  150 mg Oral Q breakfast   Continuous Infusions: . sodium chloride     PRN Meds:.sodium chloride, acetaminophen, albuterol, hydrALAZINE, ondansetron (ZOFRAN) IV, sodium chloride flush      Subjective:   Dawn Sherman was seen and examined today.  Feels better, off oxygen since last night.  States shortness of breath is significantly improved from admission.  Patient denies dizziness, abdominal pain, N/V/D/C, new weakness, numbess, tingling. No acute events overnight.    Objective:   Vitals:   02/11/20 2028 02/12/20 0420 02/12/20 0748 02/12/20 1130  BP:  (!) 108/43 128/74 (!) 96/52  Pulse:  81 83 75  Resp:  20 16 16   Temp:  97.7 F (36.5 C) 97.6 F (36.4 C) 97.7 F (36.5 C)  TempSrc:  Oral    SpO2: 93% 94% 95% 100%  Weight:  55 kg    Height:        Intake/Output Summary (Last 24 hours) at 02/12/2020 1355 Last data filed at 02/12/2020 0950 Gross per 24 hour  Intake 240 ml  Output 700 ml  Net -460 ml     Wt Readings from Last 3 Encounters:  02/12/20 55 kg  12/06/19 56.2 kg  07/13/19 59.9 kg     Exam  General: Alert and oriented x 3, NAD  Cardiovascular: S1 S2 auscultated, no murmurs, RRR  Respiratory: Clear to auscultation bilaterally, no wheezing, rales or rhonchi  Gastrointestinal: Soft, nontender, nondistended, + bowel sounds  Ext: no pedal edema bilaterally  Neuro: No new deficit  Musculoskeletal: No digital cyanosis, clubbing  Skin: No rashes  Psych: Normal affect and demeanor, alert and oriented x3    Data Reviewed:  I have personally reviewed following labs and imaging studies  Micro Results Recent Results (from the past 240 hour(s))  SARS Coronavirus 2 by RT PCR (hospital order, performed in [place] hospital lab) Nasopharyngeal Nasopharyngeal Swab     Status: None   Collection Time: 02/10/20  2:42 AM   Specimen:  Nasopharyngeal Swab  Result Value Ref Range Status   SARS Coronavirus 2 NEGATIVE NEGATIVE Final    Comment: (NOTE) SARS-CoV-2 target nucleic acids are NOT DETECTED.  The SARS-CoV-2 RNA is generally detectable in upper and lower respiratory specimens during the acute phase of infection. The lowest concentration of SARS-CoV-2 viral copies this assay can detect is 250 copies / mL. A negative result does not preclude SARS-CoV-2 infection and should not be used as the sole basis for treatment or other patient management  decisions.  A negative result may occur with improper specimen collection / handling, submission of specimen other than nasopharyngeal swab, presence of viral mutation(s) within the areas targeted by this assay, and inadequate number of viral copies (<250 copies / mL). A negative result must be combined with clinical observations, patient history, and epidemiological information.  Fact Sheet for Patients:   StrictlyIdeas.no  Fact Sheet for Healthcare Providers: BankingDealers.co.za  This test is not yet approved or  cleared by the [place] FDA and has been authorized for detection and/or diagnosis of SARS-CoV-2 by FDA under an Emergency Use Authorization (EUA).  This EUA will remain in effect (meaning this test can be used) for the duration of the COVID-19 declaration under Section 564(b)(1) of the Act, 21 U.S.C. section 360bbb-3(b)(1), unless the authorization is terminated or revoked sooner.  Performed at [facility], [address], [place], [place] [address]     Radiology Reports DG Chest 2 View  Result Date: 02/09/2020 CLINICAL DATA:  Shortness of breath EXAM: CHEST - 2 VIEW COMPARISON:  05/21/2006 FINDINGS: There is hyperinflation of the lungs compatible with COPD. Bibasilar linear densities, likely scarring. Platelike atelectasis or scarring in the left upper lobe. Cardiomegaly. No effusions or  acute bony abnormality. Severe rightward scoliosis in the lower thoracic spine. IMPRESSION: COPD/chronic changes.  No active disease. Electronically Signed   By: Rolm Baptise M.D.   On: 02/09/2020 20:32   CT Angio Chest PE W and/or Wo Contrast  Result Date: 02/10/2020 CLINICAL DATA:  Hypoxemia EXAM: CT ANGIOGRAPHY CHEST WITH CONTRAST TECHNIQUE: Multidetector CT imaging of the chest was performed using the standard protocol during bolus administration of intravenous contrast. Multiplanar CT image reconstructions and MIPs were obtained to evaluate the vascular anatomy. CONTRAST:  74mL OMNIPAQUE IOHEXOL 350 MG/ML SOLN COMPARISON:  None. FINDINGS: Cardiovascular: There is a optimal opacification of the pulmonary arteries. There is no central,segmental, or subsegmental filling defects within the pulmonary arteries. There is mild to moderate cardiomegaly present. No pericardial effusion or thickening. No evidence right heart strain. There is normal three-vessel brachiocephalic anatomy without proximal stenosis. Scattered mild aortic atherosclerosis is seen. Mediastinum/Nodes: No hilar, mediastinal, or axillary adenopathy. Thyroid gland, trachea, and esophagus demonstrate no significant findings. Lungs/Pleura: Interlobular septal thickening is seen throughout both lungs. There is small bilateral pleural effusions, right greater than left. Hazy patchy airspace opacity seen at both lung bases, right greater than left. Upper Abdomen: No acute abnormalities present in the visualized portions of the upper abdomen. Musculoskeletal: No chest wall abnormality. No acute or significant osseous findings. 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 Review of the MIP images confirms the above findings. IMPRESSION: No central, segmental, or subsegmental pulmonary embolism. Small bilateral pleural effusions, right greater than left. Findings consistent with interstitial edema. Ground-glass opacities at both lung bases right  greater than left which may be due to asymmetric edema and/or atelectasis. Electronically Signed   By: Prudencio Pair M.D.   On: 02/10/2020 03:29   NM Myocar Multi W/Spect W/Wall Motion / EF  Result Date: 02/11/2020 Pharmacological myocardial perfusion imaging study with no significant  Ischemia Thinning in the anterior wall, fixed, likely secondary to processing error and significant GI uptake artifact Global hypokinesis,  EF estimated at 24% No EKG changes concerning for ischemia at peak stress or in recovery. CT attenuation correction images with no significant coronary calcification, no aortic atherosclerosis Moderate risk scan given severely depressed ejection fraction Signed, Esmond Plants, MD, Ph.D [facility]   ECHOCARDIOGRAM COMPLETE  Result Date: 02/10/2020    ECHOCARDIOGRAM REPORT   Patient Name:   Dawn Sherman Date of Exam: 02/10/2020 Medical Rec #:  [id number]     Height:       65.0 in Accession #:    [id number]    Weight:       123.0 lb Date of Birth:  06-Jan-1956    BSA:          1.609 m Patient Age:    73 years      BP:           133/89 mmHg Patient Gender: F             HR:           79 bpm. Exam Location:  [facility] Procedure: 2D Echo, Cardiac Doppler and Color Doppler Indications:     CHF- acute diastolic 952.84  History:         Patient has no prior history of Echocardiogram examinations.                  Risk Factors:Hypertension. Tobacco abuse.  Sonographer:     Sherrie Sport RDCS (AE) Referring Phys:  [id number] Soledad Gerlach NIU Diagnosing Phys: Ida Rogue MD IMPRESSIONS  1. Left ventricular ejection fraction, by estimation, is 30 to 35%. The left ventricle has moderately decreased function. The left ventricle demonstrates global hypokinesis. The left ventricular internal cavity size was mildly dilated. Left ventricular diastolic parameters are consistent with Grade II diastolic dysfunction (pseudonormalization).  2. Right ventricular systolic function is normal. The right ventricular size is normal.  There is mildly elevated pulmonary artery systolic pressure.  3. The mitral valve is normal in structure. Moderate mitral valve regurgitation. No evidence of mitral stenosis.  4. Tricuspid valve regurgitation is mild to moderate. FINDINGS  Left Ventricle: Left ventricular ejection fraction, by estimation, is 30 to 35%. The left ventricle has moderately decreased function. The left ventricle demonstrates global hypokinesis. The left ventricular internal cavity size was mildly dilated. There is no left ventricular hypertrophy. Left ventricular diastolic parameters are consistent with Grade II diastolic dysfunction (pseudonormalization). Right Ventricle: The right ventricular size is normal. No increase in right ventricular wall thickness. Right ventricular systolic function is normal. There is mildly elevated pulmonary artery systolic pressure. The tricuspid regurgitant velocity is 3.08  m/s, and with an assumed right atrial pressure of 10 mmHg, the estimated right ventricular systolic pressure is 40.1 mmHg. Left Atrium: Left atrial size was normal in size. Right Atrium: Right atrial size was normal in size. Pericardium: Trivial pericardial effusion is present. Mitral Valve: The mitral valve is normal in structure. Normal mobility of the mitral valve leaflets. Moderate mitral valve regurgitation. No evidence of mitral valve stenosis. Tricuspid Valve: The tricuspid valve is normal in structure. Tricuspid valve regurgitation is mild to moderate. No evidence of tricuspid stenosis. Aortic Valve: The aortic valve is normal in structure. Aortic valve regurgitation is not visualized. No aortic stenosis is present. Aortic valve mean gradient measures 4.0 mmHg. Aortic valve peak gradient measures 7.2 mmHg. Aortic valve area, by VTI measures 2.92 cm. Pulmonic Valve: The pulmonic valve was normal in structure. Pulmonic valve regurgitation is not visualized. No evidence of pulmonic stenosis. Aorta: The aortic root is normal in  size and structure. Venous: The inferior vena cava is normal in size with greater than 50% respiratory variability, suggesting right atrial pressure of 3 mmHg. IAS/Shunts: No atrial level shunt detected by color flow Doppler.  LEFT VENTRICLE PLAX 2D LVIDd:  5.33 cm      Diastology LVIDs:         4.47 cm      LV e' lateral:   7.83 cm/s LV PW:         1.17 cm      LV E/e' lateral: 17.5 LV IVS:        0.96 cm      LV e' medial:    7.29 cm/s LVOT diam:     2.20 cm      LV E/e' medial:  18.8 LV SV:         55 LV SV Index:   34 LVOT Area:     3.80 cm  LV Volumes (MOD) LV vol d, MOD A2C: 142.0 ml LV vol d, MOD A4C: 106.0 ml LV vol s, MOD A2C: 97.4 ml LV vol s, MOD A4C: 65.4 ml LV SV MOD A2C:     44.6 ml LV SV MOD A4C:     106.0 ml LV SV MOD BP:      41.9 ml RIGHT VENTRICLE RV Basal diam:  3.43 cm LEFT ATRIUM             Index       RIGHT ATRIUM           Index LA diam:        3.70 cm 2.30 cm/m  RA Area:     16.50 cm LA Vol (A2C):   97.4 ml 60.54 ml/m RA Volume:   49.60 ml  30.83 ml/m LA Vol (A4C):   51.2 ml 31.82 ml/m LA Biplane Vol: 77.6 ml 48.23 ml/m  AORTIC VALVE                   PULMONIC VALVE AV Area (Vmax):    2.18 cm    PV Vmax:        0.60 m/s AV Area (Vmean):   2.13 cm    PV Peak grad:   1.4 mmHg AV Area (VTI):     2.92 cm    RVOT Peak grad: 3 mmHg AV Vmax:           134.00 cm/s AV Vmean:          94.800 cm/s AV VTI:            0.188 m AV Peak Grad:      7.2 mmHg AV Mean Grad:      4.0 mmHg LVOT Vmax:         77.00 cm/s LVOT Vmean:        53.100 cm/s LVOT VTI:          0.145 m LVOT/AV VTI ratio: 0.77  AORTA Ao Root diam: 2.80 cm MITRAL VALVE                TRICUSPID VALVE MV Area (PHT): 6.83 cm     TR Peak grad:   37.9 mmHg MV Decel Time: 111 msec     TR Vmax:        308.00 cm/s MV E velocity: 137.00 cm/s MV A velocity: 126.00 cm/s  SHUNTS MV E/A ratio:  1.09         Systemic VTI:  0.14 m                             Systemic Diam: 2.20 cm Ida Rogue MD Electronically signed by Ida Rogue  MD Signature Date/Time: 02/10/2020/4:07:46 PM  Final     Lab Data:  CBC: Recent Labs  Lab 02/09/20 2024 02/11/20 0404  WBC 12.7* 11.1*  NEUTROABS 9.7*  --   HGB 11.7* 11.9*  HCT 36.1 35.6*  MCV 89.8 87.0  PLT 315 022   Basic Metabolic Panel: Recent Labs  Lab 02/09/20 2216 02/11/20 0404 02/12/20 0521  NA 138 138 138  K 4.3 4.2 4.1  CL 107 103 99  CO2 22 25 29   GLUCOSE 135* 112* 114*  BUN 26* 33* 37*  CREATININE 1.42* 1.61* 2.03*  CALCIUM 9.0 8.6* 8.9  MG  --  1.7  --    GFR: Estimated Creatinine Clearance: 24.3 mL/min (A) (by C-G formula based on SCr of 2.03 mg/dL (H)). Liver Function Tests: Recent Labs  Lab 02/09/20 2216  AST 31  ALT 29  ALKPHOS 187*  BILITOT 0.9  PROT 7.0  ALBUMIN 3.8   No results for input(s): LIPASE, AMYLASE in the last 168 hours. No results for input(s): AMMONIA in the last 168 hours. Coagulation Profile: No results for input(s): INR, PROTIME in the last 168 hours. Cardiac Enzymes: No results for input(s): CKTOTAL, CKMB, CKMBINDEX, TROPONINI in the last 168 hours. BNP (last 3 results) No results for input(s): PROBNP in the last 8760 hours. HbA1C: Recent Labs    02/11/20 0404  HGBA1C 5.7*   CBG: No results for input(s): GLUCAP in the last 168 hours. Lipid Profile: Recent Labs    02/11/20 0404  CHOL 209*  HDL 90  LDLCALC 107*  TRIG 62  CHOLHDL 2.3   Thyroid Function Tests: No results for input(s): TSH, T4TOTAL, FREET4, T3FREE, THYROIDAB in the last 72 hours. Anemia Panel: No results for input(s): VITAMINB12, FOLATE, FERRITIN, TIBC, IRON, RETICCTPCT in the last 72 hours. Urine analysis:    Component Value Date/Time   COLORURINE COLORLESS (A) 02/10/2020 0242   APPEARANCEUR CLEAR (A) 02/10/2020 0242   LABSPEC 1.006 02/10/2020 0242   PHURINE 5.0 02/10/2020 0242   GLUCOSEU NEGATIVE 02/10/2020 0242   GLUCOSEU NEGATIVE 01/22/2017 1052   HGBUR NEGATIVE 02/10/2020 0242   BILIRUBINUR NEGATIVE 02/10/2020 0242   KETONESUR  NEGATIVE 02/10/2020 0242   PROTEINUR 30 (A) 02/10/2020 0242   UROBILINOGEN 0.2 01/22/2017 1052   NITRITE NEGATIVE 02/10/2020 0242   LEUKOCYTESUR NEGATIVE 02/10/2020 0242     Dawn Sherman M.D. [facility] 02/12/2020, 1:55 PM   Call night coverage person covering after 7pm

## 2020-02-12 NOTE — Progress Notes (Signed)
Up to bathroom.  Complains of dizziness on return to bed.  Gait steady, no neuro deficits noted.  Recent B/P noted.  Will notify on call provider coverage.

## 2020-02-13 DIAGNOSIS — N183 Chronic kidney disease, stage 3 unspecified: Secondary | ICD-10-CM

## 2020-02-13 LAB — BASIC METABOLIC PANEL
Anion gap: 10 (ref 5–15)
BUN: 38 mg/dL — ABNORMAL HIGH (ref 8–23)
CO2: 26 mmol/L (ref 22–32)
Calcium: 9.1 mg/dL (ref 8.9–10.3)
Chloride: 101 mmol/L (ref 98–111)
Creatinine, Ser: 1.87 mg/dL — ABNORMAL HIGH (ref 0.44–1.00)
GFR calc Af Amer: 32 mL/min — ABNORMAL LOW (ref >60)
GFR calc non Af Amer: 28 mL/min — ABNORMAL LOW (ref >60)
Glucose, Bld: 100 mg/dL — ABNORMAL HIGH (ref 70–99)
Potassium: 4.4 mmol/L (ref 3.5–5.1)
Sodium: 137 mmol/L (ref 135–145)

## 2020-02-13 MED ORDER — ATORVASTATIN CALCIUM 40 MG PO TABS: 40.0000 mg | ORAL_TABLET | Freq: Every day | ORAL | 1 refills | Status: DC

## 2020-02-13 MED ORDER — FUROSEMIDE 20 MG PO TABS
20.0000 mg | ORAL_TABLET | Freq: Every day | ORAL | 3 refills | Status: DC
Start: 2020-02-13 — End: 2020-06-26

## 2020-02-13 MED ORDER — SACUBITRIL-VALSARTAN 24-26 MG PO TABS: 1.0000 | ORAL_TABLET | Freq: Two times a day (BID) | ORAL | 2 refills | Status: DC

## 2020-02-13 MED ORDER — CARVEDILOL 12.5 MG PO TABS: 12.5000 mg | ORAL_TABLET | Freq: Two times a day (BID) | ORAL | 3 refills | Status: DC

## 2020-02-13 MED ORDER — ASPIRIN 81 MG PO TBEC: 81.0000 mg | DELAYED_RELEASE_TABLET | Freq: Every day | ORAL | 11 refills | Status: DC

## 2020-02-13 NOTE — Progress Notes (Signed)
Progress Note  Patient Name: Dawn Sherman Date of Encounter: 02/13/2020  Primary Cardiologist:   Ida Rogue, MD   Subjective   No SOB.  No pain.  Ready to go home.    Inpatient Medications    Scheduled Meds: . aspirin EC  81 mg Oral Daily  . atorvastatin  40 mg Oral Daily  . buPROPion  150 mg Oral Daily  . carvedilol  12.5 mg Oral BID WC  . dextromethorphan-guaiFENesin  1 tablet Oral BID  . enoxaparin (LOVENOX) injection  1 mg/kg Subcutaneous Q24H  . loratadine  10 mg Oral Daily  . nicotine  21 mg Transdermal Daily  . pantoprazole  40 mg Oral Daily  . pneumococcal 23 valent vaccine  0.5 mL Intramuscular Tomorrow-1000  . sacubitril-valsartan  1 tablet Oral BID  . sodium chloride flush  3 mL Intravenous Q12H  . venlafaxine XR  150 mg Oral Q breakfast   Continuous Infusions: . sodium chloride     PRN Meds: sodium chloride, acetaminophen, albuterol, hydrALAZINE, ondansetron (ZOFRAN) IV, sodium chloride flush   Vital Signs    Vitals:   02/12/20 1601 02/12/20 2004 02/13/20 0415 02/13/20 0831  BP: 121/62 (!) 90/53 110/65 130/68  Pulse: 80 76 84 90  Resp: 17   18  Temp: 98 F (36.7 C) 98.6 F (37 C) 98 F (36.7 C) 98.4 F (36.9 C)  TempSrc:  Oral Oral   SpO2: 95% 100% 95% 99%  Weight:   55.8 kg   Height:        Intake/Output Summary (Last 24 hours) at 02/13/2020 0922 Last data filed at 02/13/2020 0419 Gross per 24 hour  Intake 960 ml  Output 1400 ml  Net -440 ml   Filed Weights   02/11/20 1251 02/12/20 0420 02/13/20 0415  Weight: 55.7 kg 55 kg 55.8 kg    Telemetry    NSR, rare PVCs, PACs.  - Personally Reviewed  ECG    NA - Personally Reviewed  Physical Exam   GEN: No  acute distress.   Neck: No  JVD Cardiac: RRR, no murmurs, rubs, or gallops.  Respiratory: Clear  to auscultation bilaterally. GI: Soft, nontender, non-distended, normal bowel sounds  MS:  No edema; No deformity. Neuro:   Nonfocal  Psych: Oriented and appropriate    Labs     Chemistry Recent Labs  Lab 02/09/20 2216 02/09/20 2216 02/11/20 0404 02/12/20 0521 02/13/20 0541  NA 138   < > 138 138 137  K 4.3   < > 4.2 4.1 4.4  CL 107   < > 103 99 101  CO2 22   < > 25 29 26   GLUCOSE 135*   < > 112* 114* 100*  BUN 26*   < > 33* 37* 38*  CREATININE 1.42*   < > 1.61* 2.03* 1.87*  CALCIUM 9.0   < > 8.6* 8.9 9.1  PROT 7.0  --   --   --   --   ALBUMIN 3.8  --   --   --   --   AST 31  --   --   --   --   ALT 29  --   --   --   --   ALKPHOS 187*  --   --   --   --   BILITOT 0.9  --   --   --   --   GFRNONAA 39*   < > 33* 25* 28*  GFRAA 45*   < >  39* 29* 32*  ANIONGAP 9   < > 10 10 10    < > = values in this interval not displayed.     Hematology Recent Labs  Lab 02/09/20 2024 02/11/20 0404  WBC 12.7* 11.1*  RBC 4.02 4.09  HGB 11.7* 11.9*  HCT 36.1 35.6*  MCV 89.8 87.0  MCH 29.1 29.1  MCHC 32.4 33.4  RDW 13.3 13.5  PLT 315 311    Cardiac EnzymesNo results for input(s): TROPONINI in the last 168 hours. No results for input(s): TROPIPOC in the last 168 hours.   BNP Recent Labs  Lab 02/10/20 0521  BNP 3,345.9*     DDimer No results for input(s): DDIMER in the last 168 hours.   Radiology    NM Myocar Multi W/Spect W/Wall Motion / EF  Result Date: 02/11/2020 Pharmacological myocardial perfusion imaging study with no significant  Ischemia Thinning in the anterior wall, fixed, likely secondary to processing error and significant GI uptake artifact Global hypokinesis,  EF estimated at 24% No EKG changes concerning for ischemia at peak stress or in recovery. CT attenuation correction images with no significant coronary calcification, no aortic atherosclerosis Moderate risk scan given severely depressed ejection fraction Signed, Esmond Plants, MD, Ph.D [facility]    Cardiac Studies   Lexiscan Myoview:  Pharmacological myocardial perfusion imaging study with no significant  Ischemia Thinning in the anterior wall, fixed, likely secondary to  processing error and significant GI uptake artifact Global hypokinesis,  EF estimated at 24% No EKG changes concerning for ischemia at peak stress or in recovery. CT attenuation correction images with no significant coronary calcification, no aortic atherosclerosis Moderate risk scan given severely depressed ejection fraction  Patient Profile     64 y.o. female  with a history of HTN, HL, Tob Abuse, CKD III, prior etoh abuse, and depression, who is being seen for the evaluation of progressive dyspnea and CHF at the request of Dr. Blaine Hamper.  Assessment & Plan    ACUTE SYSTOLIC HF:    Norvasc stopped.  Changed to Coreg.  Entresto started.   On PO Lasix.  Net negative 3 liters.  OK to continue meds as listed.   HTN:    This is being managed in the context of treating his CHF  PAF:    On Eliquis.  Currently maintaining NSR.    CKD IIIB:  Creat is stable.     We will arrange follow up and call her with this.      For questions or updates, please contact [place] Please consult [email or web address] for contact info under Cardiology/STEMI.   Signed, Minus Breeding, MD  02/13/2020, 9:22 AM

## 2020-02-13 NOTE — Discharge Summary (Signed)
Physician Discharge Summary   Patient ID: Dawn Sherman MRN: [id number] DOB/AGE: 1956/05/10 64 y.o.  Admit date: 02/10/2020 Discharge date: 02/13/2020  Primary Care Physician:  [name], MD   Recommendations for Outpatient Follow-up:  1. Follow up with PCP in 1-2 weeks 2. Please obtain BMP in one week 3. Started on Lasix 20 mg daily  Home Health: None, currently at baseline Equipment/Devices:   Discharge Condition: stable CODE STATUS: FULL Diet recommendation: Heart healthy diet   Discharge Diagnoses:    . Acute respiratory failure with hypoxia ([place]) New onset acute combined CHF Paroxysmal atrial fibrillation . Acute on CKD (chronic kidney disease), stage IIIa . Depression . Hypertension . Tobacco abuse . Leukocytosis . HLD (hyperlipidemia) . Elevated troponin . GERD (gastroesophageal reflux disease)    Consults:  cardiology, Dr. Percival Spanish    Allergies:   Allergies  Allergen Reactions  . Amoxicillin   . Benicar [Olmesartan]     Talked with patient February 10, 2020, intolerance is unclear, tried several medications around that time and one of them gave her a rash but she is not clear which 1.  . Codeine Sulfate   . Morphine And Related   . Penicillins   . Sulfate   . Clindamycin/Lincomycin Rash     DISCHARGE MEDICATIONS: Allergies as of 02/13/2020      Reactions   Amoxicillin    Benicar [olmesartan]    Talked with patient February 10, 2020, intolerance is unclear, tried several medications around that time and one of them gave her a rash but she is not clear which 1.   Codeine Sulfate    Morphine And Related    Penicillins    Sulfate    Clindamycin/lincomycin Rash      Medication List    STOP taking these medications   amLODipine 5 MG tablet Commonly known as: NORVASC     TAKE these medications   acetaminophen 325 MG tablet Commonly known as: TYLENOL Take 650 mg by mouth every 6 (six) hours as needed.   aspirin 81 MG EC tablet Take 1  tablet (81 mg total) by mouth daily. Swallow whole. Start taking on: February 14, 2020   atorvastatin 40 MG tablet Commonly known as: LIPITOR Take 1 tablet (40 mg total) by mouth daily. Start taking on: February 14, 2020   buPROPion 150 MG 24 hr tablet Commonly known as: WELLBUTRIN XL One tab po daily   carvedilol 12.5 MG tablet Commonly known as: COREG Take 1 tablet (12.5 mg total) by mouth 2 (two) times daily with a meal.   furosemide 20 MG tablet Commonly known as: Lasix Take 1 tablet (20 mg total) by mouth daily.   loratadine 10 MG tablet Commonly known as: CLARITIN Take 10 mg by mouth daily.   omeprazole 20 MG capsule Commonly known as: PRILOSEC Take 20 mg by mouth daily.   sacubitril-valsartan 24-26 MG Commonly known as: ENTRESTO Take 1 tablet by mouth 2 (two) times daily.   venlafaxine XR 150 MG 24 hr capsule Commonly known as: EFFEXOR-XR TAKE 1 CAPSULE BY MOUTH EVERY DAY        Brief H and P: For complete details please refer to admission H and P, but in brief Admit date:02/10/2020 64 y.o.femalewith medical history significant ofhypertension, hyperlipidemia, GERD, depression, endometriosis, CKD stage III, alcohol abuse in remission, tobacco abuse, who presents with shortness of breathx 3 days. ED Course: Afebrile,Patient has oxygen desaturation to 90% on room air, which improved to 97% on 3 L  found to have BNP 3345.9, troponin20 -->20,negative COVID-19 PCR, negative urinalysis, lactic acid of 0.7, stable renal function, temperature normal, blood pressure 143/73, tachycardia, RR 23.Chest x-ray showed COPD without infiltration. CT angiogram was negative for PE, but showed interstitial pulmonary edema, and bilateral small pleural effusion. Hospital course: [facility] with[facility] Rockey Situ for cardsconsulted.  Hospital Course:   Acute respiratory failure with hypoxia secondary to new onset acute combined CHF (congestive heart  failure) (HCC) -Presented with shortness of breath for 3 days, elevated BNP at the time of admission -Patient was placed on IV Lasix for diuresis, negative balance of 2.5 L at the time of discharge.  -2D echo showed EF of 30 to 35% with grade 2 diastolic dysfunction -Cardiology was consulted, underwent stress test on 6/18 showed significantly lower EF 24%, no significant ischemia -Patient has been transitioned to oral Lasix 20 mg daily, started Entresto on 6/19, creatinine has remained stable. -Continue Eliquis, Coreg, Lasix, Entresto, will likely need outpatient cardiac cath, will defer to cardiology  Paroxysmal atrial fibrillation -Rate currently controlled, on Coreg - CHADS Vasc 3, started on Eliquis  Elevated troponin -Likely due to demand ischemia due to #1, -Nuclear medicine stress test showed low EF but no reversible ischemia  Essential hypertension BP currently stable   Acute on chronic CKD stage IIIa -Baseline creatinine 1.3-1.6 -Creatinine however trending up due to diuresis, now started on Entresto -Fattening improving, 1.8 at the time of discharge, was 2.0 on 6/19  Leukocytosis -Likely reactive, no fever, no source of infection identified. -WBC count trending down  Hyperlipidemia -Not taking medications at home LDL 107  GERD -Continue PPI  Tobacco use Patient was placed on nicotine patch    Day of Discharge S: Doing well, no acute complaints, no chest pain or shortness of breath, feels close to her baseline, hoping to go home  BP 130/68 (BP Location: Left Arm)   Pulse 90   Temp 98.4 F (36.9 C)   Resp 18   Ht 5\' 5"  (1.651 m)   Wt 55.8 kg   SpO2 99%   BMI 20.47 kg/m   Physical Exam: General: Alert and awake oriented x3 not in any acute distress. HEENT: anicteric sclera, pupils reactive to light and accommodation CVS: S1-S2 clear no murmur rubs or gallops Chest: clear to auscultation bilaterally, no wheezing rales or rhonchi Abdomen: soft  nontender, nondistended, normal bowel sounds Extremities: no cyanosis, clubbing or edema noted bilaterally Neuro: Cranial nerves II-XII intact, no focal neurological deficits    Get Medicines reviewed and adjusted: Please take all your medications with you for your next visit with your Primary MD  Please request your Primary MD to go over all hospital tests and procedure/radiological results at the follow up. Please ask your Primary MD to get all Hospital records sent to his/her office.  If you experience worsening of your admission symptoms, develop shortness of breath, life threatening emergency, suicidal or homicidal thoughts you must seek medical attention immediately by calling 911 or calling your MD immediately  if symptoms less severe.  You must read complete instructions/literature along with all the possible adverse reactions/side effects for all the Medicines you take and that have been prescribed to you. Take any new Medicines after you have completely understood and accept all the possible adverse reactions/side effects.   Do not drive when taking pain medications.   Do not take more than prescribed Pain, Sleep and Anxiety Medications  Special Instructions: If you have smoked or  chewed Tobacco  in the last 2 yrs please stop smoking, stop any regular Alcohol  and or any Recreational drug use.  Wear Seat belts while driving.  Please note  You were cared for by a hospitalist during your hospital stay. Once you are discharged, your primary care physician will handle any further medical issues. Please note that NO REFILLS for any discharge medications will be authorized once you are discharged, as it is imperative that you return to your primary care physician (or establish a relationship with a primary care physician if you do not have one) for your aftercare needs so that they can reassess your need for medications and monitor your lab values.   The results of significant  diagnostics from this hospitalization (including imaging, microbiology, ancillary and laboratory) are listed below for reference.      Procedures/Studies:  DG Chest 2 View  Result Date: 02/09/2020 CLINICAL DATA:  Shortness of breath EXAM: CHEST - 2 VIEW COMPARISON:  05/21/2006 FINDINGS: There is hyperinflation of the lungs compatible with COPD. Bibasilar linear densities, likely scarring. Platelike atelectasis or scarring in the left upper lobe. Cardiomegaly. No effusions or acute bony abnormality. Severe rightward scoliosis in the lower thoracic spine. IMPRESSION: COPD/chronic changes.  No active disease. Electronically Signed   By: Rolm Baptise M.D.   On: 02/09/2020 20:32   CT Angio Chest PE W and/or Wo Contrast  Result Date: 02/10/2020 CLINICAL DATA:  Hypoxemia EXAM: CT ANGIOGRAPHY CHEST WITH CONTRAST TECHNIQUE: Multidetector CT imaging of the chest was performed using the standard protocol during bolus administration of intravenous contrast. Multiplanar CT image reconstructions and MIPs were obtained to evaluate the vascular anatomy. CONTRAST:  80mL OMNIPAQUE IOHEXOL 350 MG/ML SOLN COMPARISON:  None. FINDINGS: Cardiovascular: There is a optimal opacification of the pulmonary arteries. There is no central,segmental, or subsegmental filling defects within the pulmonary arteries. There is mild to moderate cardiomegaly present. No pericardial effusion or thickening. No evidence right heart strain. There is normal three-vessel brachiocephalic anatomy without proximal stenosis. Scattered mild aortic atherosclerosis is seen. Mediastinum/Nodes: No hilar, mediastinal, or axillary adenopathy. Thyroid gland, trachea, and esophagus demonstrate no significant findings. Lungs/Pleura: Interlobular septal thickening is seen throughout both lungs. There is small bilateral pleural effusions, right greater than left. Hazy patchy airspace opacity seen at both lung bases, right greater than left. Upper Abdomen: No acute  abnormalities present in the visualized portions of the upper abdomen. Musculoskeletal: No chest wall abnormality. No acute or significant osseous findings. 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 Review of the MIP images confirms the above findings. IMPRESSION: No central, segmental, or subsegmental pulmonary embolism. Small bilateral pleural effusions, right greater than left. Findings consistent with interstitial edema. Ground-glass opacities at both lung bases right greater than left which may be due to asymmetric edema and/or atelectasis. Electronically Signed   By: Prudencio Pair M.D.   On: 02/10/2020 03:29   NM Myocar Multi W/Spect W/Wall Motion / EF  Result Date: 02/11/2020 Pharmacological myocardial perfusion imaging study with no significant  Ischemia Thinning in the anterior wall, fixed, likely secondary to processing error and significant GI uptake artifact Global hypokinesis,  EF estimated at 24% No EKG changes concerning for ischemia at peak stress or in recovery. CT attenuation correction images with no significant coronary calcification, no aortic atherosclerosis Moderate risk scan given severely depressed ejection fraction Signed, Esmond Plants, MD, Ph.D [facility]   ECHOCARDIOGRAM COMPLETE  Result Date: 02/10/2020    ECHOCARDIOGRAM REPORT   Patient Name:  [name] Date of Exam: 02/10/2020 Medical Rec #:  [id number]     Height:       65.0 in Accession #:    [id number]    Weight:       123.0 lb Date of Birth:  [birth year]    BSA:          1.609 m Patient Age:    21 years      BP:           133/89 mmHg Patient Gender: F             HR:           79 bpm. Exam Location:  [facility] Procedure: 2D Echo, Cardiac Doppler and Color Doppler Indications:     CHF- acute diastolic 660.63  History:         Patient has no prior history of Echocardiogram examinations.                  Risk Factors:Hypertension. Tobacco abuse.  Sonographer:     Sherrie Sport RDCS (AE) Referring Phys:  [id number] Soledad Gerlach NIU  Diagnosing Phys: Ida Rogue MD IMPRESSIONS  1. Left ventricular ejection fraction, by estimation, is 30 to 35%. The left ventricle has moderately decreased function. The left ventricle demonstrates global hypokinesis. The left ventricular internal cavity size was mildly dilated. Left ventricular diastolic parameters are consistent with Grade II diastolic dysfunction (pseudonormalization).  2. Right ventricular systolic function is normal. The right ventricular size is normal. There is mildly elevated pulmonary artery systolic pressure.  3. The mitral valve is normal in structure. Moderate mitral valve regurgitation. No evidence of mitral stenosis.  4. Tricuspid valve regurgitation is mild to moderate. FINDINGS  Left Ventricle: Left ventricular ejection fraction, by estimation, is 30 to 35%. The left ventricle has moderately decreased function. The left ventricle demonstrates global hypokinesis. The left ventricular internal cavity size was mildly dilated. There is no left ventricular hypertrophy. Left ventricular diastolic parameters are consistent with Grade II diastolic dysfunction (pseudonormalization). Right Ventricle: The right ventricular size is normal. No increase in right ventricular wall thickness. Right ventricular systolic function is normal. There is mildly elevated pulmonary artery systolic pressure. The tricuspid regurgitant velocity is 3.08  m/s, and with an assumed right atrial pressure of 10 mmHg, the estimated right ventricular systolic pressure is 10.9 mmHg. Left Atrium: Left atrial size was normal in size. Right Atrium: Right atrial size was normal in size. Pericardium: Trivial pericardial effusion is present. Mitral Valve: The mitral valve is normal in structure. Normal mobility of the mitral valve leaflets. Moderate mitral valve regurgitation. No evidence of mitral valve stenosis. Tricuspid Valve: The tricuspid valve is normal in structure. Tricuspid valve regurgitation is mild to  moderate. No evidence of tricuspid stenosis. Aortic Valve: The aortic valve is normal in structure. Aortic valve regurgitation is not visualized. No aortic stenosis is present. Aortic valve mean gradient measures 4.0 mmHg. Aortic valve peak gradient measures 7.2 mmHg. Aortic valve area, by VTI measures 2.92 cm. Pulmonic Valve: The pulmonic valve was normal in structure. Pulmonic valve regurgitation is not visualized. No evidence of pulmonic stenosis. Aorta: The aortic root is normal in size and structure. Venous: The inferior vena cava is normal in size with greater than 50% respiratory variability, suggesting right atrial pressure of 3 mmHg. IAS/Shunts: No atrial level shunt detected by color flow Doppler.  LEFT VENTRICLE PLAX 2D LVIDd:         5.33 cm  Diastology LVIDs:         4.47 cm      LV e' lateral:   7.83 cm/s LV PW:         1.17 cm      LV E/e' lateral: 17.5 LV IVS:        0.96 cm      LV e' medial:    7.29 cm/s LVOT diam:     2.20 cm      LV E/e' medial:  18.8 LV SV:         55 LV SV Index:   34 LVOT Area:     3.80 cm  LV Volumes (MOD) LV vol d, MOD A2C: 142.0 ml LV vol d, MOD A4C: 106.0 ml LV vol s, MOD A2C: 97.4 ml LV vol s, MOD A4C: 65.4 ml LV SV MOD A2C:     44.6 ml LV SV MOD A4C:     106.0 ml LV SV MOD BP:      41.9 ml RIGHT VENTRICLE RV Basal diam:  3.43 cm LEFT ATRIUM             Index       RIGHT ATRIUM           Index LA diam:        3.70 cm 2.30 cm/m  RA Area:     16.50 cm LA Vol (A2C):   97.4 ml 60.54 ml/m RA Volume:   49.60 ml  30.83 ml/m LA Vol (A4C):   51.2 ml 31.82 ml/m LA Biplane Vol: 77.6 ml 48.23 ml/m  AORTIC VALVE                   PULMONIC VALVE AV Area (Vmax):    2.18 cm    PV Vmax:        0.60 m/s AV Area (Vmean):   2.13 cm    PV Peak grad:   1.4 mmHg AV Area (VTI):     2.92 cm    RVOT Peak grad: 3 mmHg AV Vmax:           134.00 cm/s AV Vmean:          94.800 cm/s AV VTI:            0.188 m AV Peak Grad:      7.2 mmHg AV Mean Grad:      4.0 mmHg LVOT Vmax:          77.00 cm/s LVOT Vmean:        53.100 cm/s LVOT VTI:          0.145 m LVOT/AV VTI ratio: 0.77  AORTA Ao Root diam: 2.80 cm MITRAL VALVE                TRICUSPID VALVE MV Area (PHT): 6.83 cm     TR Peak grad:   37.9 mmHg MV Decel Time: 111 msec     TR Vmax:        308.00 cm/s MV E velocity: 137.00 cm/s MV A velocity: 126.00 cm/s  SHUNTS MV E/A ratio:  1.09         Systemic VTI:  0.14 m                             Systemic Diam: 2.20 cm Ida Rogue MD Electronically signed by Ida Rogue MD Signature Date/Time: 02/10/2020/4:07:46 PM    Final  LAB RESULTS: Basic Metabolic Panel: Recent Labs  Lab 02/11/20 0404 02/11/20 0404 02/12/20 0521 02/13/20 0541  NA 138   < > 138 137  K 4.2   < > 4.1 4.4  CL 103   < > 99 101  CO2 25   < > 29 26  GLUCOSE 112*   < > 114* 100*  BUN 33*   < > 37* 38*  CREATININE 1.61*   < > 2.03* 1.87*  CALCIUM 8.6*   < > 8.9 9.1  MG 1.7  --   --   --    < > = values in this interval not displayed.   Liver Function Tests: Recent Labs  Lab 02/09/20 2216  AST 31  ALT 29  ALKPHOS 187*  BILITOT 0.9  PROT 7.0  ALBUMIN 3.8   No results for input(s): LIPASE, AMYLASE in the last 168 hours. No results for input(s): AMMONIA in the last 168 hours. CBC: Recent Labs  Lab 02/09/20 2024 02/09/20 2024 02/11/20 0404  WBC 12.7*  --  11.1*  NEUTROABS 9.7*  --   --   HGB 11.7*  --  11.9*  HCT 36.1  --  35.6*  MCV 89.8   < > 87.0  PLT 315  --  311   < > = values in this interval not displayed.   Cardiac Enzymes: No results for input(s): CKTOTAL, CKMB, CKMBINDEX, TROPONINI in the last 168 hours. BNP: Invalid input(s): POCBNP CBG: No results for input(s): GLUCAP in the last 168 hours.     Disposition and Follow-up: Discharge Instructions    (HEART FAILURE PATIENTS) Call MD:  Anytime you have any of the following symptoms: 1) 3 pound weight gain in 24 hours or 5 pounds in 1 week 2) shortness of breath, with or without a dry hacking cough 3) swelling  in the hands, feet or stomach 4) if you have to sleep on extra pillows at night in order to breathe.   Complete by: As directed    Diet - low sodium heart healthy   Complete by: As directed    Increase activity slowly   Complete by: As directed        DISPOSITION: [facility] Follow up on 03/02/2020.   Specialty: Cardiology Why: at 11:00am. Enter through the [place] entrance Contact information: [address] [place], [place], [place]. Schedule an appointment as soon as possible for a visit in 2 week(s).   Specialty: Internal Medicine Contact information: [address] [place] [address] [phone]        Minna Merritts, MD .   Specialty: Cardiology Contact information: [place] [address] [phone]                Time coordinating discharge:  35 minutes  Signed:   Estill Cotta M.D. [facility] 02/13/2020, 12:09 PM

## 2020-02-13 NOTE — Progress Notes (Signed)
Patient discharged to home. Tele and IV d/c'd. Patient verbalizes understanding of discharge instructions.  Entresto coupon given prior to discharge.

## 2020-02-14 ENCOUNTER — Telehealth: Payer: Self-pay | Admitting: Internal Medicine

## 2020-02-14 NOTE — Telephone Encounter (Signed)
Pt was discharged from the hospital on 6/20 for Acute CHF  appt scheduled with dr. [name] on 6/30 @11 :30

## 2020-02-14 NOTE — Telephone Encounter (Signed)
Noted. Transition of care will not be completed due to disqualifying insurance. Keep scheduled hospital follow up as directed.

## 2020-02-15 ENCOUNTER — Telehealth: Payer: Self-pay | Admitting: Family

## 2020-02-15 NOTE — Telephone Encounter (Signed)
Noted  

## 2020-02-15 NOTE — Telephone Encounter (Signed)
LVM with patient regarding her new patient CHF Clinic appointment that was made after her discharge from hospital for 7/8 at [place], [place]

## 2020-02-21 ENCOUNTER — Ambulatory Visit: Payer: BC Managed Care – PPO | Admitting: Family

## 2020-02-21 ENCOUNTER — Encounter: Payer: Self-pay | Admitting: Family

## 2020-02-21 ENCOUNTER — Other Ambulatory Visit: Payer: Self-pay | Admitting: *Deleted

## 2020-02-21 ENCOUNTER — Other Ambulatory Visit: Payer: Self-pay

## 2020-02-21 VITALS — BP 132/88 | HR 75 | Ht 65.0 in | Wt 121.5 lb

## 2020-02-21 DIAGNOSIS — I5020 Unspecified systolic (congestive) heart failure: Secondary | ICD-10-CM

## 2020-02-21 DIAGNOSIS — E782 Mixed hyperlipidemia: Secondary | ICD-10-CM

## 2020-02-21 DIAGNOSIS — I10 Essential (primary) hypertension: Secondary | ICD-10-CM | POA: Diagnosis not present

## 2020-02-21 DIAGNOSIS — N183 Chronic kidney disease, stage 3 unspecified: Secondary | ICD-10-CM

## 2020-02-21 DIAGNOSIS — Z7901 Long term (current) use of anticoagulants: Secondary | ICD-10-CM

## 2020-02-21 DIAGNOSIS — I480 Paroxysmal atrial fibrillation: Secondary | ICD-10-CM | POA: Diagnosis not present

## 2020-02-21 MED ORDER — APIXABAN 2.5 MG PO TABS: 2.5000 mg | ORAL_TABLET | Freq: Two times a day (BID) | ORAL | 11 refills | Status: DC

## 2020-02-21 NOTE — Progress Notes (Addendum)
Office Visit    Patient Name: Dawn Sherman Date of Encounter: 02/21/2020  Primary Care Provider:  [name], MD Primary Cardiologist:  Ida Rogue, MD Electrophysiologist:  None   Chief Complaint    Dawn Sherman is a 64 y.o. female with a hx of congestive heart failure, HTN, HLD, tobacco use, CKD 3, prior alcohol use, depression presents today for hospital follow-up  Past Medical History    Past Medical History:  Diagnosis Date  . B12 deficiency   . CKD (chronic kidney disease), stage III   . Depression   . Endometriosis   . Hypertension   . Mixed hyperlipidemia   . Tobacco abuse    Past Surgical History:  Procedure Laterality Date  . ABDOMINAL HYSTERECTOMY    . BREAST BIOPSY Right 2015   neg-  FIBROADENOMA  . TAH/RSO  1999   secondary to bleeding and endometriosis (Dr Vernie Ammons)    Allergies  Allergies  Allergen Reactions  . Amoxicillin   . Benicar [Olmesartan]     Talked with patient February 10, 2020, intolerance is unclear, tried several medications around that time and one of them gave her a rash but she is not clear which 1.  . Codeine Sulfate   . Morphine And Related   . Penicillins   . Sulfate   . Clindamycin/Lincomycin Rash    History of Present Illness    Dawn Sherman is a 64 y.o. female with a hx of  congestive heart failure, HTN, HLD, tobacco use, CKD 3, prior alcohol use, PAF, depression, valvular heart disease, endometriosis, B12 deficiency last seen while hospitalized.  She is retired and enjoys spending time with her grandsons. She lives locally with her husband.  No formal exercise routine.  Presented to the ED for dyspnea.  Her EGD GI admission was notable for sinus tachycardia with LVH.  Labs showed stable creatinine 1.42, BNP 3345.9, mild troponin elevation at 20-> 20 -> 16.  CXR showed COPD and chronic changes without active disease while CTA chest was negative for PE but showed small right greater than left bilateral pleural  effusions, interstitial edema, mild aortic atherosclerosis.  She was diagnosed with new onset PAF. She declined cardiac catheterization and subsequently had a Lexiscan Myoview showing no significant ischemia.  Echo 02/10/2020 LVEF 30 to 53%, grade 2 diastolic dysfunction, RV normal size and function with mildly elevated PASP, moderate MR, mild to moderate TR.Marland Kitchen  Her Norvasc was stopped and changed to Coreg.  She was started on Entresto, p.o. Lasix, Eliquis.  See her primary care Wednesday this week and her nephrologist, Dr. [name], Thursday this week.   Reports she has been very careful about her low-sodium diet and drinking less than 2 L of fluid per day.  She started to read labels.  She reports marked improvement in her dyspnea on exertion.  She reports no shortness of breath at rest.  She reports no edema, orthopnea, PND.  She reports no chest pain, pressure, tightness.  We reviewed her diagnoses and hospitalization in depth.  She was recommended to discharge on Eliquis due to PAF noted during hospitalization however it does not appear that this was prescribed at discharge.  She is agreeable to start  EKGs/Labs/Other Studies Reviewed:   The following studies were reviewed today: Lexiscan Myoview 01/2020 Pharmacological myocardial perfusion imaging study with no significant  Ischemia Thinning in the anterior wall, fixed, likely secondary to processing error and significant GI uptake artifact Global hypokinesis,  EF estimated  at 24% No EKG changes concerning for ischemia at peak stress or in recovery. CT attenuation correction images with no significant coronary calcification, no aortic atherosclerosis Moderate risk scan given severely depressed ejection fraction  Echo 02/10/20 1. Left ventricular ejection fraction, by estimation, is 30 to 35%. The  left ventricle has moderately decreased function. The left ventricle  demonstrates global hypokinesis. The left ventricular internal cavity size  was  mildly dilated. Left ventricular  diastolic parameters are consistent with Grade II diastolic dysfunction  (pseudonormalization).   2. Right ventricular systolic function is normal. The right ventricular  size is normal. There is mildly elevated pulmonary artery systolic  pressure.   3. The mitral valve is normal in structure. Moderate mitral valve  regurgitation. No evidence of mitral stenosis.   4. Tricuspid valve regurgitation is mild to moderate.   EKG:  EKG is ordered today.  The ekg ordered today demonstrates SR 75 bpm with stable inferior and anterolateral ST/T wave changes. No acute changes compared to prior tracing.   Recent Labs: 03/03/2019: TSH 3.21 02/09/2020: ALT 29 02/10/2020: B Natriuretic Peptide 3,345.9 02/11/2020: Hemoglobin 11.9; Magnesium 1.7; Platelets 311 02/13/2020: BUN 38; Creatinine, Ser 1.87; Potassium 4.4; Sodium 137  Recent Lipid Panel    Component Value Date/Time   CHOL 209 (H) 02/11/2020 0404   TRIG 62 02/11/2020 0404   HDL 90 02/11/2020 0404   CHOLHDL 2.3 02/11/2020 0404   VLDL 12 02/11/2020 0404   LDLCALC 107 (H) 02/11/2020 0404   LDLDIRECT 53.3 10/14/2012 0821    Home Medications   Current Meds  Medication Sig  . acetaminophen (TYLENOL) 325 MG tablet Take 650 mg by mouth every 6 (six) hours as needed.  Marland Kitchen atorvastatin (LIPITOR) 40 MG tablet Take 1 tablet (40 mg total) by mouth daily.  Marland Kitchen buPROPion (WELLBUTRIN XL) 150 MG 24 hr tablet One tab po daily  . carvedilol (COREG) 12.5 MG tablet Take 1 tablet (12.5 mg total) by mouth 2 (two) times daily with a meal.  . furosemide (LASIX) 20 MG tablet Take 1 tablet (20 mg total) by mouth daily.  Marland Kitchen loratadine (CLARITIN) 10 MG tablet Take 10 mg by mouth daily.  Marland Kitchen omeprazole (PRILOSEC) 20 MG capsule Take 20 mg by mouth daily.  . sacubitril-valsartan (ENTRESTO) 24-26 MG Take 1 tablet by mouth 2 (two) times daily.  Marland Kitchen venlafaxine XR (EFFEXOR-XR) 150 MG 24 hr capsule TAKE 1 CAPSULE BY MOUTH EVERY DAY  .  [DISCONTINUED] aspirin EC 81 MG EC tablet Take 1 tablet (81 mg total) by mouth daily. Swallow whole.    Review of Systems   Review of Systems  Constitutional: Negative for chills, fever and malaise/fatigue.  Cardiovascular: Negative for chest pain, dyspnea on exertion, irregular heartbeat, leg swelling, near-syncope, orthopnea, palpitations and syncope.  Respiratory: Negative for cough, shortness of breath and wheezing.   Gastrointestinal: Negative for melena, nausea and vomiting.  Genitourinary: Negative for hematuria.  Neurological: Negative for dizziness, light-headedness and weakness.   All other systems reviewed and are otherwise negative except as noted above.  Physical Exam    VS:  BP 132/88 (BP Location: Left Arm, Patient Position: Sitting, Cuff Size: Normal)   Pulse 75   Ht 5\' 5"  (1.651 m)   Wt 121 lb 8 oz (55.1 kg)   SpO2 96%   BMI 20.22 kg/m  , BMI Body mass index is 20.22 kg/m. GEN: Well nourished, well developed, in no acute distress. HEENT: normal. Neck: Supple, no JVD, carotid bruits, or masses. Cardiac: RRR,  no murmurs, rubs, or gallops. No clubbing, cyanosis, edema.  Radials/DP/PT 2+ and equal bilaterally.  Respiratory:  Respirations regular and unlabored, clear to auscultation bilaterally. GI: Soft, nontender, nondistended, BS + x 4. MS: No deformity or atrophy. Skin: Warm and dry, no rash. Neuro:  Strength and sensation are intact. Psych: Normal affect.  Assessment & Plan    1.  HFrEF-Echo 01/2020 LVEF 49-82%, grade 2 diastolic dysfunction.  Lexiscan Myoview 01/2020 with no evidence of significant ischemia.  NYHA I-II.  Euvolemic and well compensated on exam.  GDMT includes Coreg, Lasix, Entresto.  Will defer addition of MRA at this time due to CKD and need for BMP.  Upcoming follow-up with heart failure clinic Darylene Price, NP.  Encourage participate in cardiac rehab.  Reviewed low-sodium diet and less than 2 L fluid restriction.  Plan for repeat echocardiogram  in approximately 3 months.  If EF not improved to greater than 35% will need to initiate discussion regarding ICD vs cardiac catheterization.  2. Valvular heart disease -Echo 01/2020 moderate MR, mild to moderate TR. continue optimal control of blood pressure and volume. 3. HTN -BP well controlled.  Continue present antihypertensive. 4. CKD -appointment Thursday with nephrology Dr. [name].  Careful monitoring of renal function in setting of new heart failure therapy. BMP today. 5. COPD/Tobacco use -has cut back to 3 to 4 cigarettes/day.  Component of progress.  Complete smoking cessation encouraged. Recommend utilization of 1800QUITNOW. 6. PAF - New diagnosis during recent imaging.  CHA2DS2-VASc score of at least 3 (gender, HTN, HF).  She does was recommended for Eliquis on hospital discharge but not prescribed.  Stop Aspirin. Start Eliquis 2.5 mg twice daily.  Reduced dose due to weight and renal function.  Plan for repeat CBC in approximately 1 month follow-up.   Disposition: Follow-up in 2 weeks with heart failure clinic in 1 to 2 months with Dr. Rockey Situ or APP.  Loel Dubonnet, NP 02/21/2020, 8:18 PM

## 2020-02-21 NOTE — Patient Instructions (Addendum)
Medication Instructions:  Your physician has recommended you make the following change in your medication:   STOP Aspirin  START Eliquis 2.5mg  twice daily *this is a blood thinner to help protect you from stroke due to the atrial fibrillation (irregular heart beat) that was noted during your hospital stay*  Samples given today: - Eliquis 2.5mg  Lot: [id number] Exp: 4/22 # 1 box  Lot: [id number] Exp: 12/21 #1 box  *If you need a refill on your cardiac medications before your next appointment, please call your pharmacy*   Lab Work: Your physician recommends that you have lab work today: BMET  If you have labs (blood work) drawn today and your tests are completely normal, you will receive your results only by: Marland Kitchen MyChart Message (if you have MyChart) OR . A paper copy in the mail If you have any lab test that is abnormal or we need to change your treatment, we will call you to review the results.  Testing/Procedures: Your echocardiogram in the hospital showed a low pumping function of 30-35%. Normal is 50-65%. Our goal is to improve your pumping function on your medications.   We will plan to do another echocardiogram in about 3 months.   Your EKG today was stable compared to the ones you had done in the hospital.   Follow-Up: At [facility], you and your health needs are our priority.  As part of our continuing mission to provide you with exceptional heart care, we have created designated Provider Care Teams.  These Care Teams include your primary Cardiologist (physician) and Advanced Practice Providers (APPs -  Physician Assistants and Nurse Practitioners) who all work together to provide you with the care you need, when you need it.  We recommend signing up for the patient portal called "MyChart".  Sign up information is provided on this After Visit Summary.  MyChart is used to connect with patients for Virtual Visits (Telemedicine).  Patients are able to view lab/test results,  encounter notes, upcoming appointments, etc.  Non-urgent messages can be sent to your provider as well.   To learn more about what you can do with MyChart, go to [email or web address].    Your next appointment:   In 1 week with Darylene Price, NP as previously scheduled in the heart failure clinic.   In person with Dr. Rockey Situ in 1- 2 months.   Other Instructions - You have been referred to Cardiac Rehab at [facility]. They will call you to arrange an orientation. It may take up to 1-2 weeks to hear from their department. If you do not hear anything after 2 weeks, please call our office and let us know so we can follow up on this for you [phone].   Heart Failure, Diagnosis  Heart failure is a condition in which the heart has trouble pumping blood because it has become weak or stiff. This means that the heart does not pump blood well enough for the body to stay healthy. For some people with heart failure, fluid may back up into the lungs. There may also be swelling (edema) in the lower legs. Heart failure is usually a long-term (chronic) condition. It is important for you to take good care of yourself and follow the treatment plan from your health care provider. What are the causes? This condition may be caused by:  High blood pressure (hypertension). Hypertension causes the heart muscle to work harder than normal. This makes the heart stiff or weak.  Coronary artery disease, or CAD. CAD is  the buildup of cholesterol and fat (plaque) in the arteries of the heart.  Heart attack, also called myocardial infarction. This injures the heart muscle, making it hard for the heart to pump blood.  Abnormal heart valves. The valves do not open and close properly, forcing the heart to pump harder to keep the blood flowing.  Heart muscle disease (cardiomyopathy or myocarditis). This is damage to the heart muscle. It can increase the risk of heart failure.  Lung disease. The heart works harder when the  lungs are not healthy.  Abnormal heart rhythms. These can lead to heart failure. What increases the risk? The risk of heart failure increases as a person ages. This condition is also more likely to develop in people who:  Are overweight.  Are female.  Smoke or chew tobacco.  Abuse alcohol or illegal drugs.  Have taken medicines that can damage the heart, such as chemotherapy drugs.  Have diabetes.  Have abnormal heart rhythms.  Have thyroid problems.  Have low blood counts (anemia). What are the signs or symptoms? Symptoms of this condition include:  Shortness of breath with activity, such as when climbing stairs.  A cough that does not go away.  Swelling of the feet, ankles, legs, or abdomen.  Losing weight for no reason.  Trouble breathing when lying flat (orthopnea).  Waking from sleep because of the need to sit up and get more air.  Rapid heartbeat.  Tiredness (fatigue) and loss of energy.  Feeling light-headed, dizzy, or close to fainting.  Loss of appetite.  Nausea.  Waking up more often during the night to urinate (nocturia).  Confusion. How is this diagnosed? This condition is diagnosed based on:  Your medical history, symptoms, and a physical exam.  Diagnostic tests, which may include: ? Echocardiogram. ? Electrocardiogram (ECG). ? Chest X-ray. ? Blood tests. ? Exercise stress test. ? Radionuclide scans. ? Cardiac catheterization and angiogram. How is this treated? Treatment for this condition is aimed at managing the symptoms of heart failure. Medicines Treatment may include medicines that:  Help lower blood pressure by relaxing (dilating) the blood vessels. These medicines are called ACE inhibitors (angiotensin-converting enzyme) and ARBs (angiotensin receptor blockers).  Cause the kidneys to remove salt and water from the blood through urination (diuretics).  Improve heart muscle strength and prevent the heart from beating too fast  (beta blockers).  Increase the force of the heartbeat (digoxin). Healthy behavior changes     Treatment may also include making healthy lifestyle changes, such as:  Reaching and staying at a healthy weight.  Quitting smoking or chewing tobacco.  Eating heart-healthy foods.  Limiting or avoiding alcohol.  Stopping the use of illegal drugs.  Being physically active.  Other treatments Other treatments may include:  Procedures to open blocked arteries or repair damaged valves.  Placing a pacemaker to improve heart function (cardiac resynchronization therapy).  Placing a device to treat serious abnormal heart rhythms (implantable cardioverter defibrillator, or ICD).  Placing a device to improve the pumping ability of the heart (left ventricular assist device, or LVAD).  Receiving a healthy heart from a donor (heart transplant). This is done when other treatments have not helped. Follow these instructions at home:  Manage other health conditions as told by your health care provider. These may include hypertension, diabetes, thyroid disease, or abnormal heart rhythms.  Get ongoing education and support as needed. Learn as much as you can about heart failure.  Keep all follow-up visits as told by your health  care provider. This is important. Summary  Heart failure is a condition in which the heart has trouble pumping blood because it has become weak or stiff.  This condition is caused by high blood pressure and other diseases of the heart and lungs.  Symptoms of this condition include shortness of breath, tiredness (fatigue), nausea, and swelling of the feet, ankles, legs, or abdomen.  Treatments for this condition may include medicines, lifestyle changes, and surgery.  Manage other health conditions as told by your health care provider. This information is not intended to replace advice given to you by your health care provider. Make sure you discuss any questions you have  with your health care provider. Document Revised: 10/30/2018 Document Reviewed: 10/30/2018 Elsevier Patient Education  [place].

## 2020-02-22 ENCOUNTER — Telehealth: Payer: Self-pay | Admitting: Family

## 2020-02-22 LAB — BASIC METABOLIC PANEL
BUN/Creatinine Ratio: 12 (ref 12–28)
BUN: 23 mg/dL (ref 8–27)
CO2: 20 mmol/L (ref 20–29)
Calcium: 9.6 mg/dL (ref 8.7–10.3)
Chloride: 95 mmol/L — ABNORMAL LOW (ref 96–106)
Creatinine, Ser: 1.92 mg/dL — ABNORMAL HIGH (ref 0.57–1.00)
GFR calc Af Amer: 31 mL/min/{1.73_m2} — ABNORMAL LOW (ref >59)
GFR calc non Af Amer: 27 mL/min/{1.73_m2} — ABNORMAL LOW (ref >59)
Glucose: 79 mg/dL (ref 65–99)
Potassium: 5.5 mmol/L — ABNORMAL HIGH (ref 3.5–5.2)
Sodium: 132 mmol/L — ABNORMAL LOW (ref 134–144)

## 2020-02-22 NOTE — Telephone Encounter (Signed)
Attempted to call the patient. No answer- I left a message to please call the office back today.

## 2020-02-22 NOTE — Telephone Encounter (Signed)
I called and spoke with the patient regarding her lab results. She was aware of Laurann Montana, NP's recommendations to: - hold entresto until further notice - repeat a STAT BMP - continue lasix  The patient states she is due to see Dr. [name] in the morning at 9:30 am. She is aware I will reach out to his office to make sure they redraw her BMP.  I have confirmed with the patient: - to not resume entresto until she hears back from our office - that she is not on any OTC potassium supplements - that she does use Mrs. Dash  I have advised her to discontinue salt substitutes at this time.  The patient voices understanding of the above recommendations and is agreeable.

## 2020-02-22 NOTE — Telephone Encounter (Signed)
Loel Dubonnet, NP  02/22/2020 8:16 AM EDT     Labs show mild decline in kidney function compared to discharge. Noted hyperkalemia (high potassium) which could be lab error, due to kidney dysfunction, or due to [facility]. Hold Entresto until further notice. Please ask her to get repeat stat BMP today. Continue Lasix.   Please ensure she is not taking any over the counter potassium supplement. Recommend avoiding salt substitutes.

## 2020-02-23 ENCOUNTER — Telehealth (INDEPENDENT_AMBULATORY_CARE_PROVIDER_SITE_OTHER): Payer: BC Managed Care – PPO | Admitting: Internal Medicine

## 2020-02-23 DIAGNOSIS — E7800 Pure hypercholesterolemia, unspecified: Secondary | ICD-10-CM

## 2020-02-23 DIAGNOSIS — F32A Depression, unspecified: Secondary | ICD-10-CM

## 2020-02-23 DIAGNOSIS — N1832 Chronic kidney disease, stage 3b: Secondary | ICD-10-CM

## 2020-02-23 DIAGNOSIS — F329 Major depressive disorder, single episode, unspecified: Secondary | ICD-10-CM

## 2020-02-23 DIAGNOSIS — K219 Gastro-esophageal reflux disease without esophagitis: Secondary | ICD-10-CM

## 2020-02-23 DIAGNOSIS — Z720 Tobacco use: Secondary | ICD-10-CM

## 2020-02-23 DIAGNOSIS — I10 Essential (primary) hypertension: Secondary | ICD-10-CM

## 2020-02-23 DIAGNOSIS — E538 Deficiency of other specified B group vitamins: Secondary | ICD-10-CM

## 2020-02-23 NOTE — Progress Notes (Signed)
Patient ID: Dawn Sherman, female   DOB: [birth year], 64 y.o.   MRN: [id number]   Virtual Visit via telephone Note  This visit type was conducted due to national recommendations for restrictions regarding the COVID-19 pandemic (e.g. social distancing).  This format is felt to be most appropriate for this patient at this time.  All issues noted in this document were discussed and addressed.  No physical exam was performed (except for noted visual exam findings with Video Visits).   I connected with Dawn Sherman by telephone and verified that I am speaking with the correct person using two identifiers. Location patient: home Location provider: work  Persons participating in the telephone visit: patient, provider  The limitations, risks, security and privacy concerns of performing an evaluation and management service by telephone and the availability of in person appointments have been discussed.   It has also been discussed with the patient that there may be a patient responsible charge related to this service. The patient has expressed understanding and has agreed to proceed.   Reason for visit: hospital follow up.    HPI: Admitted 02/10/20 - 02/13/20 with acute respiratory failure with hypoxia.  Diagnosed with new onset of acute combined CHF and paroxysmal afib.  CT angiogram was negative for PE, but showed interstitial pulmonary edema and bilateral small pleural effusion.  Cardiology consulted.  Placed on IV lasix.  ECHO 30-35% EF with grade 2 diastolic dysfunction.  Stress test 02/11/20 showed EF 24%.  No significant ischemia.  Transitioned to oral lasix ans started on entresto.  Recommended continuing eliquis, coreg, lasix and entresto. Had f/u with cardiology 02/22/20 and found to be euvolemic and well compensated on exam.  Referred to heart failure clinic.  Recommended low sodium diet and cardiac rehab.  Also recommended f/u echo in 3 months.  Has known CKD.  Followed by nephrology.  F/u labs through  cardiology revealed GFR 31.  Advised to hold entresto.  Discussed with her today.  Clarified she was to hold entresto and take eliquis.  States she feels better.  Breathing is better.  No chest pain.  Denies any increased sob now.  No abdominal pain or bowel change now.  Blood pressure 120/74.     ROS: See pertinent positives and negatives per HPI.  Past Medical History:  Diagnosis Date  . B12 deficiency   . CKD (chronic kidney disease), stage III   . Depression   . Endometriosis   . Hypertension   . Mixed hyperlipidemia   . Tobacco abuse     Past Surgical History:  Procedure Laterality Date  . ABDOMINAL HYSTERECTOMY    . BREAST BIOPSY Right 2015   neg-  FIBROADENOMA  . TAH/RSO  1999   secondary to bleeding and endometriosis (Dr Dawn Sherman)    Family History  Problem Relation Age of Onset  . Hypercholesterolemia Mother   . Breast cancer Neg Hx   . Colon cancer Neg Hx     SOCIAL HX: reviewed.    Current Outpatient Medications:  .  acetaminophen (TYLENOL) 325 MG tablet, Take 650 mg by mouth every 6 (six) hours as needed., Disp: , Rfl:  .  apixaban (ELIQUIS) 2.5 MG TABS tablet, Take 1 tablet (2.5 mg total) by mouth 2 (two) times daily., Disp: 60 tablet, Rfl: 11 .  atorvastatin (LIPITOR) 40 MG tablet, Take 1 tablet (40 mg total) by mouth daily., Disp: 90 tablet, Rfl: 1 .  buPROPion (WELLBUTRIN XL) 150 MG 24 hr tablet, One tab po  daily, Disp: 90 tablet, Rfl: 1 .  carvedilol (COREG) 12.5 MG tablet, Take 1 tablet (12.5 mg total) by mouth 2 (two) times daily with a meal., Disp: 60 tablet, Rfl: 3 .  furosemide (LASIX) 20 MG tablet, Take 1 tablet (20 mg total) by mouth daily., Disp: 90 tablet, Rfl: 3 .  loratadine (CLARITIN) 10 MG tablet, Take 10 mg by mouth daily., Disp: , Rfl:  .  omeprazole (PRILOSEC) 20 MG capsule, Take 20 mg by mouth daily., Disp: , Rfl:  .  sacubitril-valsartan (ENTRESTO) 24-26 MG, Take 1 tablet by mouth 2 (two) times daily., Disp: 180 tablet, Rfl: 2 .   venlafaxine XR (EFFEXOR-XR) 150 MG 24 hr capsule, TAKE 1 CAPSULE BY MOUTH EVERY DAY, Disp: 90 capsule, Rfl: 1  EXAM:  VITALS per patient if applicable:  GENERAL: alert, oriented, appears well and in no acute distress  HEENT: atraumatic, conjunttiva clear, no obvious abnormalities on inspection of external nose and ears  NECK: normal movements of the head and neck  LUNGS: on inspection no signs of respiratory distress, breathing rate appears normal, no obvious gross SOB, gasping or wheezing  CV: no obvious cyanosis  MS: moves all visible extremities without noticeable abnormality  PSYCH/NEURO: pleasant and cooperative, no obvious depression or anxiety, speech and thought processing grossly intact  ASSESSMENT AND PLAN:  Discussed the following assessment and plan:  Tobacco abuse Discussed the need to quit smoking.    Hypertension Blood pressure doing well.  Advised to stop entresto per cardiology.  Continue on coreg and lasix.  Follow pressures.  Follow metabolic panel.   Hypercholesterolemia On lipitor.  Low cholesterol diet and exercise. Follow lipid panel and liver function tests.    GERD (gastroesophageal reflux disease) Controlled.  On prilosec.    Depression Stable on wellbutrin and effexor.  Follow.   CKD (chronic kidney disease), stage IIIa Has been followed by nephrology.  Worsened recently with the addition of entresto.  entresto stopped by cardiology.  Pt states has f/u with nephrology this week and is to get f/u labs through their office.  Avoid antiinflammatories.  Follow metabolic panel.    B12 deficiency Follow B12 level.    No orders of the defined types were placed in this encounter.   No orders of the defined types were placed in this encounter.    I discussed the assessment and treatment plan with the patient. The patient was provided an opportunity to ask questions and all were answered. The patient agreed with the plan and demonstrated an  understanding of the instructions.   The patient was advised to call back or seek an in-person evaluation if the symptoms worsen or if the condition fails to improve as anticipated.   Dawn Pheasant, MD

## 2020-02-25 NOTE — Telephone Encounter (Signed)
Can we just sent a fax to Dr. Elwyn Lade office to request BMET that it looks like was collected yesterday? I don't see it in [facility] yet. Not sure how long that takes to 'cross over' the system.   Loel Dubonnet, NP

## 2020-02-25 NOTE — Telephone Encounter (Signed)
Request for lab work faxed to Dr Elwyn Lade office.

## 2020-02-28 ENCOUNTER — Encounter: Payer: Self-pay | Admitting: Internal Medicine

## 2020-02-28 NOTE — Assessment & Plan Note (Signed)
Controlled. On prilosec.  

## 2020-02-28 NOTE — Assessment & Plan Note (Signed)
Stable on wellbutrin and effexor.  Follow.

## 2020-02-28 NOTE — Assessment & Plan Note (Signed)
Follow B12 level.  

## 2020-02-28 NOTE — Assessment & Plan Note (Signed)
Discussed the need to quit smoking.   

## 2020-02-28 NOTE — Assessment & Plan Note (Signed)
Has been followed by nephrology.  Worsened recently with the addition of entresto.  entresto stopped by cardiology.  Pt states has f/u with nephrology this week and is to get f/u labs through their office.  Avoid antiinflammatories.  Follow metabolic panel.

## 2020-02-28 NOTE — Assessment & Plan Note (Signed)
On lipitor.  Low cholesterol diet and exercise.  Follow lipid panel and liver function tests.   

## 2020-02-28 NOTE — Assessment & Plan Note (Signed)
Blood pressure doing well.  Advised to stop entresto per cardiology.  Continue on coreg and lasix.  Follow pressures.  Follow metabolic panel.

## 2020-03-01 NOTE — Telephone Encounter (Signed)
Received call from [facility] letting me know that results are in [place]. Routing to provider to let her know.

## 2020-03-01 NOTE — Progress Notes (Deleted)
   Patient ID: Dawn Sherman, female    DOB: 1956/03/01, 64 y.o.   MRN: [id number]  HPI  Dawn Sherman is a 64 y/o female with a history of  Echo report from 02/10/20 reviewed and showed an EF of 30-35% along with mildly elevated PA pressure, moderate MR and mild/moderate TR.   Admitted 02/10/20 due to acute heart failure. Cardiology consult obtained. Initially given IV lasix with transition to oral diuretics. Negative balance of 2.5L. Stress test completed with lower EF (24%) than echo results (30-35%). Elevated troponin thought to be due to demand ischemia. Discharged after 3 days.   She presents today for her initial visit with a chief complaint of  Review of Systems    Physical Exam    Assessment & Plan:  1: Chronic heart failure with reduced ejection fraction- - NYHA class - saw cardiology [name]) 02/21/20 - BNP 02/10/20 was 3345.9  2: HTN- - BP - had telemedicine visit with PCP [name]) 02/23/20 - BMP 02/24/20 reviewed and showed sodium 133, potassium 5.6, creatinine 1.64 and GFR 33  3: CKD- - saw nephrology [name]) 02/24/20 - microalb/creat ratio 02/24/20 was 1774

## 2020-03-02 ENCOUNTER — Ambulatory Visit: Payer: BC Managed Care – PPO | Admitting: Family

## 2020-03-03 NOTE — Telephone Encounter (Signed)
Results reviewed. Mild hyperkalemia stable. Continue to follow with nephrology. No changes at this time.   Loel Dubonnet, NP

## 2020-03-08 ENCOUNTER — Telehealth: Payer: Self-pay | Admitting: Cardiovascular Disease

## 2020-03-08 DIAGNOSIS — N183 Chronic kidney disease, stage 3 unspecified: Secondary | ICD-10-CM

## 2020-03-08 DIAGNOSIS — I5020 Unspecified systolic (congestive) heart failure: Secondary | ICD-10-CM

## 2020-03-08 NOTE — Telephone Encounter (Signed)
Spoke with patient and she reports some shortness of breath at night. Vital signs provided were normal at 120/84 HR 82, 124/88 HR 106, and 134/94 HR 92. She denies any weight gain, no shortness of breath during the day. Inquired if she is still smoking and reviewed unfortunately that can cause some of that shortness of breath. Reviewed signs and symptoms that would be concerning and if new symptoms appear or worsen to call us back. Also reviewed smoking cessation importance for future health and breathing. She verbalized understanding of our conversation, agreement with plan, and had no further questions at this time.

## 2020-03-08 NOTE — Telephone Encounter (Signed)
Pt c/o Shortness Of Breath: STAT if SOB developed within the last 24 hours or pt is noticeably SOB on the phone  1. Are you currently SOB (can you hear that pt is SOB on the phone)? No only at the end of the day  2. How long have you been experiencing SOB? Since last night  3. Are you SOB when sitting or when up moving around? Up moving around  no other symptoms, no weight gain.

## 2020-03-12 NOTE — Telephone Encounter (Signed)
I have not met her before  will forward to [place] who saw her end of June

## 2020-03-13 NOTE — Telephone Encounter (Signed)
Reviewed recommendations by provider and she verbalized understanding. She will go tomorrow 03/14/20 to get her labs and then come in the following day to see APP here in office. She verbalized understanding, confirmed instructions, and had no further questions at this time.

## 2020-03-13 NOTE — Telephone Encounter (Signed)
Left voicemail message to call back  

## 2020-03-13 NOTE — Telephone Encounter (Signed)
Shortness of breath at night indicative of possible orthopnea. We did have to discontinue her Entresto after last bloodwork due to hyperkalemia. She missed her appointment with HF clinic 03/02/20 and it has been rescheduled.  Likely beneficial to get her into the office this week or next with BMP/BNP the day prior at [facility] to assess if we can resume Entresto, check for volume overload.   Thanks! Loel Dubonnet, NP

## 2020-03-14 ENCOUNTER — Telehealth: Payer: Self-pay

## 2020-03-14 NOTE — Telephone Encounter (Signed)
PA started through [employer]: [id number] - PA Case ID: [id number] - Rx #: [id number]  Awaiting determination.

## 2020-03-15 ENCOUNTER — Other Ambulatory Visit: Payer: Self-pay

## 2020-03-15 ENCOUNTER — Ambulatory Visit: Payer: BC Managed Care – PPO | Admitting: Family

## 2020-03-15 ENCOUNTER — Other Ambulatory Visit
Admission: RE | Admit: 2020-03-15 | Discharge: 2020-03-15 | Disposition: A | Discharge status: Home / Self Care | Payer: BC Managed Care – PPO | Attending: Family | Admitting: Family

## 2020-03-15 DIAGNOSIS — I5020 Unspecified systolic (congestive) heart failure: Secondary | ICD-10-CM | POA: Diagnosis not present

## 2020-03-15 DIAGNOSIS — N183 Chronic kidney disease, stage 3 unspecified: Secondary | ICD-10-CM | POA: Insufficient documentation

## 2020-03-15 LAB — BASIC METABOLIC PANEL
Anion gap: 11 (ref 5–15)
BUN: 19 mg/dL (ref 8–23)
CO2: 25 mmol/L (ref 22–32)
Calcium: 9.2 mg/dL (ref 8.9–10.3)
Chloride: 94 mmol/L — ABNORMAL LOW (ref 98–111)
Creatinine, Ser: 1.49 mg/dL — ABNORMAL HIGH (ref 0.44–1.00)
GFR calc Af Amer: 43 mL/min — ABNORMAL LOW (ref >60)
GFR calc non Af Amer: 37 mL/min — ABNORMAL LOW (ref >60)
Glucose, Bld: 102 mg/dL — ABNORMAL HIGH (ref 70–99)
Potassium: 4.9 mmol/L (ref 3.5–5.1)
Sodium: 130 mmol/L — ABNORMAL LOW (ref 135–145)

## 2020-03-15 LAB — BRAIN NATRIURETIC PEPTIDE: B Natriuretic Peptide: 3567.3 pg/mL — ABNORMAL HIGH (ref 0.0–100.0)

## 2020-03-15 NOTE — Telephone Encounter (Signed)
Approved on July 20 Your PA request has been approved.  Additional information will be provided in the approval communication.

## 2020-03-16 ENCOUNTER — Telehealth: Payer: Self-pay | Admitting: *Deleted

## 2020-03-16 NOTE — Telephone Encounter (Signed)
Reviewed results and recommendations with patient. She reports that she is NOT taking her Entresto. She does have upcoming appointment in our office. She confirmed that appointment, had no further questions at this time, and no further concerns.

## 2020-03-16 NOTE — Telephone Encounter (Signed)
Left voicemail message to call back about results.

## 2020-03-16 NOTE — Telephone Encounter (Signed)
Patient returning call.

## 2020-03-16 NOTE — Telephone Encounter (Signed)
-----   Message from Loel Dubonnet, NP sent at 03/16/2020  9:18 AM EDT ----- BNP shows continued volume overload. Renal function improved compared to previous but not yet back to her baseline.  After last labs she was recommended to hold Entresto, can we confirm if she is still holding or if she has resumed?

## 2020-03-20 ENCOUNTER — Ambulatory Visit: Payer: BC Managed Care – PPO | Admitting: Family

## 2020-03-23 NOTE — Progress Notes (Signed)
Office Visit    Patient Name: Dawn Sherman Date of Encounter: 03/24/2020  Primary Care Provider:  [name], MD Primary Cardiologist:  Ida Rogue, MD Electrophysiologist:  None   Chief Complaint    Dawn Sherman is a 64 y.o. female with a hx of congestive heart failure, HTN, HLD, tobacco use, CKD 3, prior alcohol use, depression presents today for HF follow up.  Past Medical History    Past Medical History:  Diagnosis Date   B12 deficiency    CKD (chronic kidney disease), stage III    Depression    Endometriosis    Hypertension    Mixed hyperlipidemia    Tobacco abuse    Past Surgical History:  Procedure Laterality Date   ABDOMINAL HYSTERECTOMY     BREAST BIOPSY Right 2015   neg-  FIBROADENOMA   TAH/RSO  1999   secondary to bleeding and endometriosis (Dr Vernie Ammons)    Allergies  Allergies  Allergen Reactions   Amoxicillin    Benicar [Olmesartan]     Talked with patient February 10, 2020, intolerance is unclear, tried several medications around that time and one of them gave her a rash but she is not clear which 1.   Codeine Sulfate    Morphine And Related    Penicillins    Sulfate    Clindamycin/Lincomycin Rash    History of Present Illness    Dawn Sherman is a 64 y.o. female with a hx of  congestive heart failure, HTN, HLD, tobacco use, CKD 3, prior alcohol use, PAF, depression, valvular heart disease, endometriosis, B12 deficiency last seen 02/22/20.  She is retired and enjoys spending time with her grandsons. She lives locally with her husband.  No formal exercise routine.  Presented to the ED for dyspnea.  Her EGD GI admission was notable for sinus tachycardia with LVH.  Labs showed stable creatinine 1.42, BNP 3345.9, mild troponin elevation at 20-> 20 -> 16.  CXR showed COPD and chronic changes without active disease while CTA chest was negative for PE but showed small right greater than left bilateral pleural effusions,  interstitial edema, mild aortic atherosclerosis.  She was diagnosed with new onset PAF. She declined cardiac catheterization and subsequently had a Lexiscan Myoview showing no significant ischemia.  Echo 02/10/2020 LVEF 30 to 16%, grade 2 diastolic dysfunction, RV normal size and function with mildly elevated PASP, moderate MR, mild to moderate TR.Marland Kitchen  Her Norvasc was stopped and changed to Coreg.  She was started on Entresto, p.o. Lasix, Eliquis.  She was seen 02/21/20. Noted to be mildly hyperkalemic, but repeat labs were stable and she was encouraged to follow up with her nephrologist Dr. [name]. Her Eliquis had not been started at discharge as recommended and was started in clinic at reduced dose due to weight/renal function. In subsequent labs she had mild renal decline and was recommended to hold her Entresto. Per nephrology her baseline GFR is approximately 42.  Since discontinuation of the Entresto approximately 1 month ago her most recent labs 03/15/2020 show creatinine 1.49, GFR 37, BNP 3567.  She has been walking outside for exercise. BP at home routinely 140s/90s. Reports no shortness of breath nor dyspnea on exertion. Reports no chest pain, pressure, or tightness. No edema, orthopnea, PND. Reports no palpitations.   We reviewed the indication for Entresto in setting of heart failure.  We reviewed the relationship between renal function and potassium.  She was agreeable to resume Entresto.  We discussed the  possibility of needing medication such as Lokelma to lower her potassium in the future but will monitor carefully.    EKGs/Labs/Other Studies Reviewed:   The following studies were reviewed today:  Lexiscan Myoview 01/2020 Pharmacological myocardial perfusion imaging study with no significant  Ischemia Thinning in the anterior wall, fixed, likely secondary to processing error and significant GI uptake artifact Global hypokinesis,  EF estimated at 24% No EKG changes concerning for ischemia at  peak stress or in recovery. CT attenuation correction images with no significant coronary calcification, no aortic atherosclerosis Moderate risk scan given severely depressed ejection fraction  Echo 02/10/20 1. Left ventricular ejection fraction, by estimation, is 30 to 35%. The  left ventricle has moderately decreased function. The left ventricle  demonstrates global hypokinesis. The left ventricular internal cavity size  was mildly dilated. Left ventricular  diastolic parameters are consistent with Grade II diastolic dysfunction  (pseudonormalization).   2. Right ventricular systolic function is normal. The right ventricular  size is normal. There is mildly elevated pulmonary artery systolic  pressure.   3. The mitral valve is normal in structure. Moderate mitral valve  regurgitation. No evidence of mitral stenosis.   4. Tricuspid valve regurgitation is mild to moderate.   EKG:  EKG is ordered today.  The ekg ordered today demonstrates sinus rhythm 93 bpm with first-degree AV block (PR 224) with stable antero monitor lateral ST/T wave changes.  Recent Labs: 02/09/2020: ALT 29 02/11/2020: Hemoglobin 11.9; Magnesium 1.7; Platelets 311 03/15/2020: B Natriuretic Peptide 3,567.3; BUN 19; Creatinine, Ser 1.49; Potassium 4.9; Sodium 130  Recent Lipid Panel    Component Value Date/Time   CHOL 209 (H) 02/11/2020 0404   TRIG 62 02/11/2020 0404   HDL 90 02/11/2020 0404   CHOLHDL 2.3 02/11/2020 0404   VLDL 12 02/11/2020 0404   LDLCALC 107 (H) 02/11/2020 0404   LDLDIRECT 53.3 10/14/2012 0821    Home Medications   Current Meds  Medication Sig   acetaminophen (TYLENOL) 325 MG tablet Take 650 mg by mouth every 6 (six) hours as needed.   apixaban (ELIQUIS) 2.5 MG TABS tablet Take 1 tablet (2.5 mg total) by mouth 2 (two) times daily.   atorvastatin (LIPITOR) 40 MG tablet Take 1 tablet (40 mg total) by mouth daily.   buPROPion (WELLBUTRIN XL) 150 MG 24 hr tablet One tab po daily    carvedilol (COREG) 12.5 MG tablet Take 1 tablet (12.5 mg total) by mouth 2 (two) times daily with a meal.   furosemide (LASIX) 20 MG tablet Take 1 tablet (20 mg total) by mouth daily.   loratadine (CLARITIN) 10 MG tablet Take 10 mg by mouth daily.   omeprazole (PRILOSEC) 20 MG capsule Take 20 mg by mouth daily.   venlafaxine XR (EFFEXOR-XR) 150 MG 24 hr capsule TAKE 1 CAPSULE BY MOUTH EVERY DAY   [DISCONTINUED] aspirin EC 81 MG tablet SMARTSIG:1 Tablet(s) By Mouth Daily    Review of Systems   Review of Systems  Constitutional: Negative for chills, fever and malaise/fatigue.  Cardiovascular: Negative for chest pain, dyspnea on exertion, irregular heartbeat, leg swelling, near-syncope, orthopnea, palpitations and syncope.  Respiratory: Negative for cough, shortness of breath and wheezing.   Gastrointestinal: Negative for melena, nausea and vomiting.  Genitourinary: Negative for hematuria.  Neurological: Negative for dizziness, light-headedness and weakness.   All other systems reviewed and are otherwise negative except as noted above.  Physical Exam    VS:  BP (!) 148/92 (BP Location: Left Arm, Patient Position: Sitting, Cuff  Size: Normal)    Pulse 93    Ht 5\' 5"  (1.651 m)    Wt 118 lb 6.4 oz (53.7 kg)    SpO2 98%    BMI 19.70 kg/m  , BMI Body mass index is 19.7 kg/m. GEN: Well nourished, well developed, in no acute distress. HEENT: normal. Neck: Supple, no JVD, carotid bruits, or masses. Cardiac: RRR, no murmurs, rubs, or gallops. No clubbing, cyanosis, edema.  Radials/DP/PT 2+ and equal bilaterally.  Respiratory:  Respirations regular and unlabored, clear to auscultation bilaterally. GI: Soft, nontender, nondistended, BS + x 4. MS: No deformity or atrophy. Skin: Warm and dry, no rash. Neuro:  Strength and sensation are intact. Psych: Normal affect.  Assessment & Plan    1.  HFrEF-Echo 01/2020 LVEF 19-50%, grade 2 diastolic dysfunction.  Lexiscan Myoview 01/2020 with no  evidence of significant ischemia.  NYHA I-II.  Euvolemic and well compensated on exam.  GDMT includes Coreg, Lasix.  Will defer addition of MRA at this time due to CKD and hx of hyperkalemia.her Delene Loll was previously held due to hyperkalemia and decreased renal function, as her renal function and potassium have improved we will plan to resume Entresto 24-26 mg twice daily.  Upcoming follow-up with heart failure clinic Darylene Price, NP on 03/28/20.  We will ask her to collect repeat BMP and BNP.  Anticipate Ms. Olena Heckle may require Lokelma for hyperkalemia.  Encourage participate in cardiac rehab, referral previously placed.  Continue low-sodium diet and less than 2 L fluid restriction..  Plan for repeat echocardiogram approximately 3 months after optimization of heart failure therapies..  If EF not improved to greater than 35% will need to initiate discussion regarding ICD vs cardiac catheterization.   2. Valvular heart disease -Echo 01/2020 moderate MR, mild to moderate TR. continue optimal control of blood pressure and volume.  3. HTN -BP mildly elevated.  Anticipate will improve with addition of Entresto, as above.  4. CKD III -prior to hospitalization based on GFR 42 per nephrology.  He follows with Dr. Zollie Scale.  Most recent GFR 37.  We will plan to resume Entresto at low dose, as above.  She has had hyperkalemia in the setting of her CKD and we will need to monitor carefully.    5. COPD/Tobacco use -has cut back to 3 to 4 cigarettes/day. Complete smoking cessation encouraged. Recommend utilization of [id number].  6. PAF - New diagnosis during recent imaging.  CHA2DS2-VASc score of at least 3 (gender, HTN, HF).  Continue Eliquis 2.5 mg twice daily.  Reduced dose due to weight and renal function.  7. First degree AV block -noted on EKG today.  PR 224.  Monitor carefully with periodic EKG due to present Coreg use.   Disposition: Follow-up next week with heart failure clinic for BMP, BNP, CBC and as  previously scheduled in 5 weeks with Dr. Rockey Situ.  Loel Dubonnet, NP 03/24/2020, 10:45 AM

## 2020-03-24 ENCOUNTER — Encounter: Payer: Self-pay | Admitting: Family

## 2020-03-24 ENCOUNTER — Ambulatory Visit: Payer: BC Managed Care – PPO | Admitting: Family

## 2020-03-24 ENCOUNTER — Other Ambulatory Visit: Payer: Self-pay

## 2020-03-24 VITALS — BP 148/92 | HR 93 | Ht 65.0 in | Wt 118.4 lb

## 2020-03-24 DIAGNOSIS — I5042 Chronic combined systolic (congestive) and diastolic (congestive) heart failure: Secondary | ICD-10-CM | POA: Diagnosis not present

## 2020-03-24 DIAGNOSIS — I480 Paroxysmal atrial fibrillation: Secondary | ICD-10-CM

## 2020-03-24 DIAGNOSIS — Z720 Tobacco use: Secondary | ICD-10-CM

## 2020-03-24 DIAGNOSIS — I38 Endocarditis, valve unspecified: Secondary | ICD-10-CM

## 2020-03-24 DIAGNOSIS — J449 Chronic obstructive pulmonary disease, unspecified: Secondary | ICD-10-CM

## 2020-03-24 DIAGNOSIS — I10 Essential (primary) hypertension: Secondary | ICD-10-CM

## 2020-03-24 DIAGNOSIS — N183 Chronic kidney disease, stage 3 unspecified: Secondary | ICD-10-CM | POA: Diagnosis not present

## 2020-03-24 NOTE — Patient Instructions (Signed)
Medication Instructions:  Your physician has recommended you make the following change in your medication:   RESUME Entresto 24-26mg  twice daily  *If you need a refill on your cardiac medications before your next appointment, please call your pharmacy*  Lab Work: We will ask Darylene Price, NP to have your labs checked at your upcoming appointment. This will let us know if we need to add a medication to help lower your potassium or if it is stable.  If you have labs (blood work) drawn today and your tests are completely normal, you will receive your results only by: Marland Kitchen MyChart Message (if you have MyChart) OR . A paper copy in the mail If you have any lab test that is abnormal or we need to change your treatment, we will call you to review the results.   Testing/Procedures: Your EKG today was stable compared to previous. It showed sinus rhythm.   Follow-Up: At [facility], you and your health needs are our priority.  As part of our continuing mission to provide you with exceptional heart care, we have created designated Provider Care Teams.  These Care Teams include your primary Cardiologist (physician) and Advanced Practice Providers (APPs -  Physician Assistants and Nurse Practitioners) who all work together to provide you with the care you need, when you need it.  We recommend signing up for the patient portal called "MyChart".  Sign up information is provided on this After Visit Summary.  MyChart is used to connect with patients for Virtual Visits (Telemedicine).  Patients are able to view lab/test results, encounter notes, upcoming appointments, etc.  Non-urgent messages can be sent to your provider as well.   To learn more about what you can do with MyChart, go to [email or web address].    Your next appointment:   4 week(s)  The format for your next appointment:   In Person  Provider:    You may see Ida Rogue, MD or one of the following Advanced Practice Providers on  your designated Care Team:    Murray Hodgkins, NP  Christell Faith, PA-C  Marrianne Mood, PA-C  Laurann Montana, NP  Other Instructions   You have been referred to cardiac rehab. This is a combination program including monitored exercise, dietary education, and support group. We strongly recommend participating in the program. If you do not hear from them, the phone number for cardiac rehab at [facility] is [phone].

## 2020-03-28 ENCOUNTER — Other Ambulatory Visit: Payer: Self-pay

## 2020-03-28 ENCOUNTER — Ambulatory Visit: Payer: BC Managed Care – PPO | Attending: Family | Admitting: Family

## 2020-03-28 ENCOUNTER — Encounter: Payer: Self-pay | Admitting: Family

## 2020-03-28 VITALS — BP 152/77 | HR 96 | Resp 20 | Ht 65.0 in | Wt 118.0 lb

## 2020-03-28 DIAGNOSIS — Z79899 Other long term (current) drug therapy: Secondary | ICD-10-CM | POA: Insufficient documentation

## 2020-03-28 DIAGNOSIS — Z7901 Long term (current) use of anticoagulants: Secondary | ICD-10-CM | POA: Diagnosis not present

## 2020-03-28 DIAGNOSIS — Z90710 Acquired absence of both cervix and uterus: Secondary | ICD-10-CM | POA: Diagnosis not present

## 2020-03-28 DIAGNOSIS — I130 Hypertensive heart and chronic kidney disease with heart failure and stage 1 through stage 4 chronic kidney disease, or unspecified chronic kidney disease: Secondary | ICD-10-CM | POA: Insufficient documentation

## 2020-03-28 DIAGNOSIS — Z720 Tobacco use: Secondary | ICD-10-CM

## 2020-03-28 DIAGNOSIS — Z880 Allergy status to penicillin: Secondary | ICD-10-CM | POA: Insufficient documentation

## 2020-03-28 DIAGNOSIS — Z90721 Acquired absence of ovaries, unilateral: Secondary | ICD-10-CM | POA: Insufficient documentation

## 2020-03-28 DIAGNOSIS — E782 Mixed hyperlipidemia: Secondary | ICD-10-CM | POA: Insufficient documentation

## 2020-03-28 DIAGNOSIS — Z713 Dietary counseling and surveillance: Secondary | ICD-10-CM | POA: Insufficient documentation

## 2020-03-28 DIAGNOSIS — F329 Major depressive disorder, single episode, unspecified: Secondary | ICD-10-CM | POA: Insufficient documentation

## 2020-03-28 DIAGNOSIS — N183 Chronic kidney disease, stage 3 unspecified: Secondary | ICD-10-CM | POA: Insufficient documentation

## 2020-03-28 DIAGNOSIS — N1832 Chronic kidney disease, stage 3b: Secondary | ICD-10-CM

## 2020-03-28 DIAGNOSIS — Z885 Allergy status to narcotic agent status: Secondary | ICD-10-CM | POA: Insufficient documentation

## 2020-03-28 DIAGNOSIS — F17210 Nicotine dependence, cigarettes, uncomplicated: Secondary | ICD-10-CM | POA: Diagnosis not present

## 2020-03-28 DIAGNOSIS — I5042 Chronic combined systolic (congestive) and diastolic (congestive) heart failure: Secondary | ICD-10-CM

## 2020-03-28 DIAGNOSIS — I10 Essential (primary) hypertension: Secondary | ICD-10-CM

## 2020-03-28 DIAGNOSIS — Z881 Allergy status to other antibiotic agents status: Secondary | ICD-10-CM | POA: Insufficient documentation

## 2020-03-28 DIAGNOSIS — I5022 Chronic systolic (congestive) heart failure: Secondary | ICD-10-CM | POA: Insufficient documentation

## 2020-03-28 LAB — CBC
HCT: 37.5 % (ref 36.0–46.0)
Hemoglobin: 12.6 g/dL (ref 12.0–15.0)
MCH: 28.4 pg (ref 26.0–34.0)
MCHC: 33.6 g/dL (ref 30.0–36.0)
MCV: 84.5 fL (ref 80.0–100.0)
Platelets: 294 10*3/uL (ref 150–400)
RBC: 4.44 MIL/uL (ref 3.87–5.11)
RDW: 14.1 % (ref 11.5–15.5)
WBC: 6.8 10*3/uL (ref 4.0–10.5)
nRBC: 0 % (ref 0.0–0.2)

## 2020-03-28 LAB — BASIC METABOLIC PANEL
Anion gap: 10 (ref 5–15)
BUN: 23 mg/dL (ref 8–23)
CO2: 26 mmol/L (ref 22–32)
Calcium: 9.3 mg/dL (ref 8.9–10.3)
Chloride: 98 mmol/L (ref 98–111)
Creatinine, Ser: 1.59 mg/dL — ABNORMAL HIGH (ref 0.44–1.00)
GFR calc Af Amer: 39 mL/min — ABNORMAL LOW (ref >60)
GFR calc non Af Amer: 34 mL/min — ABNORMAL LOW (ref >60)
Glucose, Bld: 71 mg/dL (ref 70–99)
Potassium: 4.9 mmol/L (ref 3.5–5.1)
Sodium: 134 mmol/L — ABNORMAL LOW (ref 135–145)

## 2020-03-28 LAB — BRAIN NATRIURETIC PEPTIDE: B Natriuretic Peptide: 2697.8 pg/mL — ABNORMAL HIGH (ref 0.0–100.0)

## 2020-03-28 NOTE — Progress Notes (Signed)
Patient ID: Dawn Sherman, female    DOB: 1956/04/28, 64 y.o.   MRN: [id number]  HPI  Dawn Sherman is a 64 y/o female with a history of HTN, CKD, hyperlipidemia, depression, current tobacco use and chronic heart failure.   Echo report from 02/10/20 reviewed and showed an EF of 30-35% along with mildly elevated PA pressure, moderate MR and mild/moderate TR.   Admitted 02/10/20 due to acute heart failure. Cardiology consult obtained. Initially given IV lasix with transition to oral diuretics. Negative balance of 2.5L. Stress test completed with lower EF (24%) than echo results (30-35%). Elevated troponin thought to be due to demand ischemia. Discharged after 3 days.   She presents today with a chief complaint of an initial visit. She currently has no symptoms and specifically denies any difficulty sleeping, abdominal distention, palpitations, pedal edema, chest pain, dizziness, shortness of breath, cough, fatigue or weight gain.   She has recently been started back on entresto and needs lab work drawn today. Says that she's been back on entresto for ~ 3-4 days.   Past Medical History:  Diagnosis Date   B12 deficiency    CHF (congestive heart failure) (HCC)    CKD (chronic kidney disease), stage III    Depression    Endometriosis    Hypertension    Mixed hyperlipidemia    Tobacco abuse    Past Surgical History:  Procedure Laterality Date   ABDOMINAL HYSTERECTOMY     BREAST BIOPSY Right 2015   neg-  FIBROADENOMA   TAH/RSO  1999   secondary to bleeding and endometriosis (Dr Vernie Ammons)   Family History  Problem Relation Age of Onset   Hypercholesterolemia Mother    Breast cancer Neg Hx    Colon cancer Neg Hx    Social History   Tobacco Use   Smoking status: Current Every Day Smoker    Packs/day: 1.00    Years: 40.00    Pack years: 40.00    Types: Cigarettes   Smokeless tobacco: Never Used  Substance Use Topics   Alcohol use: Not Currently    Alcohol/week: 0.0  standard drinks    Comment: occasional   Allergies  Allergen Reactions   Amoxicillin    Benicar [Olmesartan]     Talked with patient February 10, 2020, intolerance is unclear, tried several medications around that time and one of them gave her a rash but she is not clear which 1.   Codeine Sulfate    Morphine And Related    Penicillins    Sulfate    Clindamycin/Lincomycin Rash   Prior to Admission medications   Medication Sig Start Date End Date Taking? Authorizing Provider  acetaminophen (TYLENOL) 325 MG tablet Take 650 mg by mouth every 6 (six) hours as needed.   Yes [provider]  apixaban (ELIQUIS) 2.5 MG TABS tablet Take 1 tablet (2.5 mg total) by mouth 2 (two) times daily. 02/21/20  Yes Loel Dubonnet, NP  atorvastatin (LIPITOR) 40 MG tablet Take 1 tablet (40 mg total) by mouth daily. 02/14/20  Yes Rai, Ripudeep K, MD  buPROPion (WELLBUTRIN XL) 150 MG 24 hr tablet One tab po daily 12/09/19  Yes [name], MD  carvedilol (COREG) 12.5 MG tablet Take 1 tablet (12.5 mg total) by mouth 2 (two) times daily with a meal. 02/13/20  Yes Rai, Ripudeep K, MD  furosemide (LASIX) 20 MG tablet Take 1 tablet (20 mg total) by mouth daily. 02/13/20 02/12/21 Yes Rai, Vernelle Emerald, MD  loratadine (  CLARITIN) 10 MG tablet Take 10 mg by mouth daily.   Yes [provider]  omeprazole (PRILOSEC) 20 MG capsule Take 20 mg by mouth daily.   Yes [provider]  sacubitril-valsartan (ENTRESTO) 24-26 MG Take 1 tablet by mouth 2 (two) times daily. 02/13/20  Yes Strader, [place] M, PA-C  venlafaxine XR (EFFEXOR-XR) 150 MG 24 hr capsule TAKE 1 CAPSULE BY MOUTH EVERY DAY 12/09/19  Yes [name], MD    Review of Systems  Constitutional: Negative for appetite change and fatigue.  HENT: Negative for congestion, postnasal drip and sore throat.   Eyes: Negative.   Respiratory: Negative for cough, shortness of breath and wheezing.   Cardiovascular: Negative for chest pain,  palpitations and leg swelling.  Gastrointestinal: Negative for abdominal distention and abdominal pain.  Endocrine: Negative.   Genitourinary: Negative.   Musculoskeletal: Negative for back pain and neck pain.  Skin: Negative.   Allergic/Immunologic: Negative.   Neurological: Negative for dizziness and light-headedness.  Hematological: Negative for adenopathy. Does not bruise/bleed easily.  Psychiatric/Behavioral: Negative for dysphoric mood and sleep disturbance. The patient is not nervous/anxious.    Vitals:   03/28/20 1453  BP: (!) 152/77  Pulse: 96  Resp: 20  SpO2: 99%  Weight: 118 lb (53.5 kg)  Height: 5\' 5"  (1.651 m)   Wt Readings from Last 3 Encounters:  03/28/20 118 lb (53.5 kg)  03/24/20 118 lb 6.4 oz (53.7 kg)  02/23/20 120 lb (54.4 kg)   Lab Results  Component Value Date   CREATININE 1.49 (H) 03/15/2020   CREATININE 1.92 (H) 02/21/2020   CREATININE 1.87 (H) 02/13/2020    Physical Exam Vitals and nursing note reviewed.  Constitutional:      Appearance: Normal appearance.  HENT:     Head: Normocephalic and atraumatic.  Cardiovascular:     Rate and Rhythm: Normal rate and regular rhythm.  Pulmonary:     Effort: Pulmonary effort is normal. No respiratory distress.     Breath sounds: No wheezing or rales.  Abdominal:     General: There is no distension.     Palpations: Abdomen is soft.     Tenderness: There is no abdominal tenderness.  Musculoskeletal:        General: No tenderness.     Cervical back: Normal range of motion and neck supple.     Right lower leg: No edema.     Left lower leg: No edema.  Skin:    General: Skin is warm and dry.  Neurological:     General: No focal deficit present.     Mental Status: She is alert and oriented to person, place, and time.  Psychiatric:        Mood and Affect: Mood normal.        Behavior: Behavior normal.        Thought Content: Thought content normal.     Assessment & Plan:  1: Chronic heart failure  with reduced ejection fraction- - NYHA class I  - euvolemic today - weighing daily; reminded to call for an overnight weight gain of >2 pounds or a weekly weight gain of >5 pounds - no longer adds salt to her food and is diligent about reading food labels for sodium content; written dietary information and a low sodium cookbook were given to her - saw cardiology [name]) 03/24/20 - entresto has been resumed by cardiology (previously stopped due to hyperkalemia); will check BMP/BNP today - consider entresto titration at future visits if labs  look good; will defer today as she's only been back on it for 3-4 days - BNP 03/15/20 was 3567.3 - reports receiving her 1st COVID vaccine  2: HTN- - BP mildly elevated but she says that she was a little nervous coming today - had telemedicine visit with PCP [name]) 02/23/20 - BMP 03/15/20 reviewed and showed sodium 130, potassium 4.9, creatinine 1.49 and GFR 37 - will check CBC today  3: CKD- - saw nephrology [name]) 02/24/20 - microalb/creat ratio 02/24/20 was 1774  4: Tobacco use- - smoking ~ 1/4 ppd of cigarettes - says that she's never completely quit - complete cessation discussed for 3 minutes with her   Medication bottles reviewed.   Return in 2 months or sooner for any questions/problems before then.

## 2020-03-28 NOTE — Patient Instructions (Signed)
Continue weighing daily and call for an overnight weight gain of > 2 pounds or a weekly weight gain of >5 pounds. 

## 2020-04-11 ENCOUNTER — Other Ambulatory Visit: Payer: Self-pay

## 2020-04-11 ENCOUNTER — Ambulatory Visit (INDEPENDENT_AMBULATORY_CARE_PROVIDER_SITE_OTHER): Payer: BC Managed Care – PPO | Admitting: Internal Medicine

## 2020-04-11 ENCOUNTER — Encounter: Payer: Self-pay | Admitting: Internal Medicine

## 2020-04-11 VITALS — BP 132/72 | HR 83 | Temp 97.8°F | Resp 16 | Ht 65.0 in | Wt 120.0 lb

## 2020-04-11 DIAGNOSIS — E2839 Other primary ovarian failure: Secondary | ICD-10-CM

## 2020-04-11 DIAGNOSIS — F32A Depression, unspecified: Secondary | ICD-10-CM

## 2020-04-11 DIAGNOSIS — Z0000 Encounter for general adult medical examination without abnormal findings: Secondary | ICD-10-CM

## 2020-04-11 DIAGNOSIS — F1011 Alcohol abuse, in remission: Secondary | ICD-10-CM

## 2020-04-11 DIAGNOSIS — N1832 Chronic kidney disease, stage 3b: Secondary | ICD-10-CM

## 2020-04-11 DIAGNOSIS — E538 Deficiency of other specified B group vitamins: Secondary | ICD-10-CM

## 2020-04-11 DIAGNOSIS — R739 Hyperglycemia, unspecified: Secondary | ICD-10-CM

## 2020-04-11 DIAGNOSIS — D72829 Elevated white blood cell count, unspecified: Secondary | ICD-10-CM

## 2020-04-11 DIAGNOSIS — K219 Gastro-esophageal reflux disease without esophagitis: Secondary | ICD-10-CM

## 2020-04-11 DIAGNOSIS — Z720 Tobacco use: Secondary | ICD-10-CM | POA: Diagnosis not present

## 2020-04-11 DIAGNOSIS — E7800 Pure hypercholesterolemia, unspecified: Secondary | ICD-10-CM

## 2020-04-11 DIAGNOSIS — F320 Major depressive disorder, single episode, mild: Secondary | ICD-10-CM

## 2020-04-11 DIAGNOSIS — I10 Essential (primary) hypertension: Secondary | ICD-10-CM

## 2020-04-11 NOTE — Progress Notes (Signed)
Patient ID: Dawn Sherman, female   DOB: 23-Feb-1956, 64 y.o.   MRN: [id number]   Subjective:    Patient ID: Dawn Sherman, female    DOB: 21-Jul-1956, 64 y.o.   MRN: [id number]  HPI This visit occurred during the SARS-CoV-2 public health emergency.  Safety protocols were in place, including screening questions prior to the visit, additional usage of staff PPE, and extensive cleaning of exam room while observing appropriate contact time as indicated for disinfecting solutions.  Patient here for her physical exam. Doing well.  Breathing well.  No chest pain or sob.  No acid reflux or abdominal pain reported.  Bowels moving.  Seeing cardiology.  EF 30-35%.  On carvedilol and entresto. Saw nephrology yesterday.  Had labs drawn.  Will obtain copy.  No changes made in her medication.  Overall feels good.  Not drinking.  Handling stress.    Past Medical History:  Diagnosis Date  . B12 deficiency   . CHF (congestive heart failure) ([place])   . CKD (chronic kidney disease), stage III   . Depression   . Endometriosis   . Hypertension   . Mixed hyperlipidemia   . Tobacco abuse    Past Surgical History:  Procedure Laterality Date  . ABDOMINAL HYSTERECTOMY    . BREAST BIOPSY Right 2015   neg-  FIBROADENOMA  . TAH/RSO  1999   secondary to bleeding and endometriosis (Dr Vernie Ammons)   Family History  Problem Relation Age of Onset  . Hypercholesterolemia Mother   . Breast cancer Neg Hx   . Colon cancer Neg Hx    Social History   Socioeconomic History  . Marital status: Married    Spouse name: Not on file  . Number of children: Not on file  . Years of education: Not on file  . Highest education level: Not on file  Occupational History  . Not on file  Tobacco Use  . Smoking status: Current Every Day Smoker    Packs/day: 1.00    Years: 40.00    Pack years: 40.00    Types: Cigarettes  . Smokeless tobacco: Never Used  Substance and Sexual Activity  . Alcohol use: Not Currently     Alcohol/week: 0.0 standard drinks    Comment: occasional  . Drug use: No  . Sexual activity: Not Currently  Other Topics Concern  . Not on file  Social History Narrative   Lives in [place] with her husband.  She is retired from [employer].  She does not routinely exercise.   Social Determinants of Health   Financial Resource Strain:   . Difficulty of Paying Living Expenses: Not on file  Food Insecurity:   . Worried About Charity fundraiser in the Last Year: Not on file  . Ran Out of Food in the Last Year: Not on file  Transportation Needs:   . Lack of Transportation (Medical): Not on file  . Lack of Transportation (Non-Medical): Not on file  Physical Activity:   . Days of Exercise per Week: Not on file  . Minutes of Exercise per Session: Not on file  Stress:   . Feeling of Stress : Not on file  Social Connections:   . Frequency of Communication with Friends and Family: Not on file  . Frequency of Social Gatherings with Friends and Family: Not on file  . Attends Religious Services: Not on file  . Active Member of Clubs or Organizations: Not on file  . Attends Club  or Organization Meetings: Not on file  . Marital Status: Not on file    Outpatient Encounter Medications as of 04/11/2020  Medication Sig  . acetaminophen (TYLENOL) 325 MG tablet Take 650 mg by mouth every 6 (six) hours as needed.  Marland Kitchen apixaban (ELIQUIS) 2.5 MG TABS tablet Take 1 tablet (2.5 mg total) by mouth 2 (two) times daily.  Marland Kitchen atorvastatin (LIPITOR) 40 MG tablet Take 1 tablet (40 mg total) by mouth daily.  Marland Kitchen buPROPion (WELLBUTRIN XL) 150 MG 24 hr tablet One tab po daily  . carvedilol (COREG) 12.5 MG tablet Take 1 tablet (12.5 mg total) by mouth 2 (two) times daily with a meal.  . furosemide (LASIX) 20 MG tablet Take 1 tablet (20 mg total) by mouth daily.  Marland Kitchen loratadine (CLARITIN) 10 MG tablet Take 10 mg by mouth daily.  Marland Kitchen omeprazole (PRILOSEC) 20 MG capsule Take 20 mg by mouth daily.  .  sacubitril-valsartan (ENTRESTO) 24-26 MG Take 1 tablet by mouth 2 (two) times daily.  Marland Kitchen venlafaxine XR (EFFEXOR-XR) 150 MG 24 hr capsule TAKE 1 CAPSULE BY MOUTH EVERY DAY   No facility-administered encounter medications on file as of 04/11/2020.    Review of Systems  Constitutional: Negative for appetite change and unexpected weight change.  HENT: Negative for congestion and sinus pressure.   Eyes: Negative for pain and visual disturbance.  Respiratory: Negative for cough, chest tightness and shortness of breath.   Cardiovascular: Negative for chest pain, palpitations and leg swelling.  Gastrointestinal: Negative for abdominal pain, diarrhea, nausea and vomiting.  Genitourinary: Negative for difficulty urinating and dysuria.  Musculoskeletal: Negative for joint swelling and myalgias.  Skin: Negative for color change and rash.  Neurological: Negative for dizziness, light-headedness and headaches.  Hematological: Negative for adenopathy. Does not bruise/bleed easily.  Psychiatric/Behavioral: Negative for agitation and dysphoric mood.       Objective:    Physical Exam Vitals reviewed.  Constitutional:      General: She is not in acute distress.    Appearance: Normal appearance. She is well-developed.  HENT:     Head: Normocephalic and atraumatic.     Right Ear: External ear normal.     Left Ear: External ear normal.  Eyes:     General: No scleral icterus.       Right eye: No discharge.        Left eye: No discharge.     Conjunctiva/sclera: Conjunctivae normal.  Neck:     Thyroid: No thyromegaly.  Cardiovascular:     Rate and Rhythm: Normal rate and regular rhythm.  Pulmonary:     Effort: No tachypnea, accessory muscle usage or respiratory distress.     Breath sounds: Normal breath sounds. No decreased breath sounds or wheezing.  Chest:     Breasts:        Right: No inverted nipple, mass, nipple discharge or tenderness (no axillary adenopathy).        Left: No inverted  nipple, mass, nipple discharge or tenderness (no axilarry adenopathy).  Abdominal:     General: Bowel sounds are normal.     Palpations: Abdomen is soft.     Tenderness: There is no abdominal tenderness.  Musculoskeletal:        General: No swelling or tenderness.     Cervical back: Neck supple. No tenderness.  Lymphadenopathy:     Cervical: No cervical adenopathy.  Skin:    Findings: No erythema or rash.  Neurological:     Mental Status: She is  alert and oriented to person, place, and time.  Psychiatric:        Mood and Affect: Mood normal.        Behavior: Behavior normal.     BP 132/72   Pulse 83   Temp 97.8 F (36.6 C) (Oral)   Resp 16   Ht 5\' 5"  (1.651 m)   Wt 120 lb (54.4 kg)   SpO2 99%   BMI 19.97 kg/m  Wt Readings from Last 3 Encounters:  04/11/20 120 lb (54.4 kg)  03/28/20 118 lb (53.5 kg)  03/24/20 118 lb 6.4 oz (53.7 kg)     Lab Results  Component Value Date   WBC 6.8 03/28/2020   HGB 12.6 03/28/2020   HCT 37.5 03/28/2020   PLT 294 03/28/2020   GLUCOSE 71 03/28/2020   CHOL 209 (H) 02/11/2020   TRIG 62 02/11/2020   HDL 90 02/11/2020   LDLDIRECT 53.3 10/14/2012   LDLCALC 107 (H) 02/11/2020   ALT 29 02/09/2020   AST 31 02/09/2020   NA 134 (L) 03/28/2020   K 4.9 03/28/2020   CL 98 03/28/2020   CREATININE 1.59 (H) 03/28/2020   BUN 23 03/28/2020   CO2 26 03/28/2020   TSH 3.21 03/03/2019   INR 1.0 12/13/2013   HGBA1C 5.7 (H) 02/11/2020       Assessment & Plan:   Problem List Items Addressed This Visit    Tobacco abuse    Discussed the need to quit smoking.  Follow.       Mild depression ([place])    Stable on wellbutrin and effexor.  Follow.       Leukocytosis    Slightly increased white blood cell count.  Follow cbc.  Last check improved.       Hypertension    Blood pressure as outlined.  On entresto and coreg.  Follow pressures.  Follow metabolic panel.       Relevant Orders   Basic metabolic panel   Hypercholesterolemia    On  lipitor.  Low cholesterol diet and exercise.  Follow lipid panel and liver function tests.        Relevant Orders   Hepatic function panel   Lipid panel   TSH   History of alcohol abuse    Has quit drinking.  Doing well.  Follow.        Health care maintenance    Physical today 04/11/20.  Colonoscopy 01/05/19 - normal.  Recommended f/u in 10 years.  Colonoscopy 12/2018 - normal.  Recommended f/u in 10 years.  PAP 03/03/19 - negative with negative HPV.       GERD (gastroesophageal reflux disease)    No upper symptoms reported.  On prilosec.       CKD (chronic kidney disease), stage IIIa    Followed by nephrology.  On entresto.  Just had labs drawn.  Obtain results.  Avoid antiinflammatories.  Follow metabolic panel.        B12 deficiency    History of B12 deficiency.  Recheck B12 level.       Relevant Orders   Vitamin B12    Other Visit Diagnoses    Estrogen deficiency    -  Primary   Relevant Orders   DG Bone Density   Hyperglycemia       Relevant Orders   Hemoglobin A1c       [name], MD

## 2020-04-11 NOTE — Assessment & Plan Note (Addendum)
Physical today 04/11/20.  Colonoscopy 01/05/19 - normal.  Recommended f/u in 10 years.  Colonoscopy 12/2018 - normal.  Recommended f/u in 10 years.  PAP 03/03/19 - negative with negative HPV.

## 2020-04-17 ENCOUNTER — Encounter: Payer: Self-pay | Admitting: Internal Medicine

## 2020-04-17 NOTE — Assessment & Plan Note (Signed)
Stable on wellbutrin and effexor.  Follow.

## 2020-04-17 NOTE — Assessment & Plan Note (Signed)
On lipitor.  Low cholesterol diet and exercise.  Follow lipid panel and liver function tests.   

## 2020-04-17 NOTE — Assessment & Plan Note (Signed)
Followed by nephrology.  On entresto.  Just had labs drawn.  Obtain results.  Avoid antiinflammatories.  Follow metabolic panel.

## 2020-04-17 NOTE — Assessment & Plan Note (Signed)
Has quit drinking.  Doing well.  Follow.

## 2020-04-17 NOTE — Assessment & Plan Note (Signed)
No upper symptoms reported. On prilosec.  

## 2020-04-17 NOTE — Assessment & Plan Note (Signed)
Blood pressure as outlined.  On entresto and coreg.  Follow pressures.  Follow metabolic panel.

## 2020-04-17 NOTE — Assessment & Plan Note (Signed)
Slightly increased white blood cell count.  Follow cbc.  Last check improved.

## 2020-04-17 NOTE — Assessment & Plan Note (Signed)
History of B12 deficiency.  Recheck B12 level.

## 2020-04-17 NOTE — Assessment & Plan Note (Signed)
Discussed the need to quit smoking. Follow.  

## 2020-04-28 ENCOUNTER — Telehealth: Payer: Self-pay | Admitting: Cardiovascular Disease

## 2020-04-28 NOTE — Telephone Encounter (Signed)
  Patient Consent for Virtual Visit         Dawn Sherman has provided verbal consent on 04/28/2020 for a virtual visit (video or telephone).   CONSENT FOR VIRTUAL VISIT FOR:  Dawn Sherman  By participating in this virtual visit I agree to the following:  I hereby voluntarily request, consent and authorize [employer] and its employed or contracted physicians, physician assistants, nurse practitioners or other licensed health care professionals (the Practitioner), to provide me with telemedicine health care services (the "Services") as deemed necessary by the treating Practitioner. I acknowledge and consent to receive the Services by the Practitioner via telemedicine. I understand that the telemedicine visit will involve communicating with the Practitioner through live audiovisual communication technology and the disclosure of certain medical information by electronic transmission. I acknowledge that I have been given the opportunity to request an in-person assessment or other available alternative prior to the telemedicine visit and am voluntarily participating in the telemedicine visit.  I understand that I have the right to withhold or withdraw my consent to the use of telemedicine in the course of my care at any time, without affecting my right to future care or treatment, and that the Practitioner or I may terminate the telemedicine visit at any time. I understand that I have the right to inspect all information obtained and/or recorded in the course of the telemedicine visit and may receive copies of available information for a reasonable fee.  I understand that some of the potential risks of receiving the Services via telemedicine include:  Marland Kitchen Delay or interruption in medical evaluation due to technological equipment failure or disruption; . Information transmitted may not be sufficient (e.g. poor resolution of images) to allow for appropriate medical decision making by the Practitioner;  and/or  . In rare instances, security protocols could fail, causing a breach of personal health information.  Furthermore, I acknowledge that it is my responsibility to provide information about my medical history, conditions and care that is complete and accurate to the best of my ability. I acknowledge that Practitioner's advice, recommendations, and/or decision may be based on factors not within their control, such as incomplete or inaccurate data provided by me or distortions of diagnostic images or specimens that may result from electronic transmissions. I understand that the practice of medicine is not an exact science and that Practitioner makes no warranties or guarantees regarding treatment outcomes. I acknowledge that a copy of this consent can be made available to me via my patient portal ([facility]), or I can request a printed copy by calling the office of [facility].    I understand that my insurance will be billed for this visit.   I have read or had this consent read to me. . I understand the contents of this consent, which adequately explains the benefits and risks of the Services being provided via telemedicine.  . I have been provided ample opportunity to ask questions regarding this consent and the Services and have had my questions answered to my satisfaction. . I give my informed consent for the services to be provided through the use of telemedicine in my medical care

## 2020-05-02 ENCOUNTER — Ambulatory Visit: Payer: BC Managed Care – PPO | Admitting: Cardiovascular Disease

## 2020-05-03 ENCOUNTER — Telehealth (INDEPENDENT_AMBULATORY_CARE_PROVIDER_SITE_OTHER): Payer: BC Managed Care – PPO | Admitting: Cardiovascular Disease

## 2020-05-03 ENCOUNTER — Encounter: Payer: Self-pay | Admitting: Cardiovascular Disease

## 2020-05-03 ENCOUNTER — Other Ambulatory Visit: Payer: Self-pay

## 2020-05-03 VITALS — BP 145/67 | HR 83 | Ht 65.0 in | Wt 118.0 lb

## 2020-05-03 DIAGNOSIS — N2889 Other specified disorders of kidney and ureter: Secondary | ICD-10-CM

## 2020-05-03 DIAGNOSIS — N1832 Chronic kidney disease, stage 3b: Secondary | ICD-10-CM | POA: Diagnosis not present

## 2020-05-03 DIAGNOSIS — I5022 Chronic systolic (congestive) heart failure: Secondary | ICD-10-CM

## 2020-05-03 DIAGNOSIS — F17200 Nicotine dependence, unspecified, uncomplicated: Secondary | ICD-10-CM

## 2020-05-03 DIAGNOSIS — I420 Dilated cardiomyopathy: Secondary | ICD-10-CM

## 2020-05-03 MED ORDER — CARVEDILOL 12.5 MG PO TABS: 18.7500 mg | ORAL_TABLET | Freq: Two times a day (BID) | ORAL | 5 refills | Status: DC

## 2020-05-03 NOTE — Addendum Note (Signed)
Addended by: Lamar Laundry on: 05/03/2020 08:47 AM   Modules accepted: Orders

## 2020-05-03 NOTE — Patient Instructions (Signed)
LABS:  BMP at [place]  Medication Instructions:  Please increase the coreg up to 18.75 (1 1/2 pills) twice a day  If you need a refill on your cardiac medications before your next appointment, please call your pharmacy.    Lab work: No new labs needed   If you have labs (blood work) drawn today and your tests are completely normal, you will receive your results only by: Marland Kitchen MyChart Message (if you have MyChart) OR . A paper copy in the mail If you have any lab test that is abnormal or we need to change your treatment, we will call you to review the results.   Testing/Procedures: No new testing needed   Follow-Up: At [facility], you and your health needs are our priority.  As part of our continuing mission to provide you with exceptional heart care, we have created designated Provider Care Teams.  These Care Teams include your primary Cardiologist (physician) and Advanced Practice Providers (APPs -  Physician Assistants and Nurse Practitioners) who all work together to provide you with the care you need, when you need it.  . You will need a follow up appointment in 2 months  . Providers on your designated Care Team:   . Murray Hodgkins, NP . Christell Faith, PA-C . Marrianne Mood, PA-C  Any Other Special Instructions Will Be Listed Below (If Applicable).  COVID-19 Vaccine Information can be found at: ShippingScam.co.uk For questions related to vaccine distribution or appointments, please email vaccine@[email or web address] .[email or web address] or call [phone].

## 2020-05-03 NOTE — Progress Notes (Signed)
Virtual Visit via Telephone Note   This visit type was conducted due to national recommendations for restrictions regarding the COVID-19 Pandemic (e.g. social distancing) in an effort to limit this patient's exposure and mitigate transmission in our community.  Due to her co-morbid illnesses, this patient is at least at moderate risk for complications without adequate follow up.  This format is felt to be most appropriate for this patient at this time.  The patient did not have access to video technology/had technical difficulties with video requiring transitioning to audio format only (telephone).  All issues noted in this document were discussed and addressed.  No physical exam could be performed with this format.  Please refer to the patient's chart for her  consent to telehealth for [facility].   I connected with  [name] on 05/03/20 by a video enabled telemedicine application and verified that I am speaking with the correct person using two identifiers. I am contacting the patient above from our cardiology clinic office or alternate office work station to their home, I discussed the limitations of evaluation and management by telemedicine. The patient expressed understanding and agreed to proceed.   Evaluation Performed:  Follow-up visit  Date:  05/03/2020   ID:  [name], DOB 11-20-1955, MRN [id number]  Patient Location:  [place] [address]   Provider location:   [facility], [place] office  PCP:  [name], MD  Cardiologist:  Arvid Right [facility]   Chief Complaint  Patient presents with  . other    2 month f/u no complaints. Meds reviewed verbally with pt.      History of Present Illness:    CHIYEKO FERRE is a 64 y.o. female who presents via audio/video conferencing for a telehealth visit today.   The patient does not symptoms concerning for COVID-19 infection (fever, chills, cough, or new SHORTNESS OF BREATH).   Patient  has a past medical history of smoker, prior alcohol,  chronic kidney disease,  hypertension,  hospital June 2021 with worsening PND orthopnea, congestive heart failure symptoms, " panic attack" Smoker Chronic kidney disease Who presents for follow-up of her diastolic and systolic CHF, PAF   hospital June 2021 Echocardiogram confirming ejection fraction 30%, global Unable to exclude hypertensive cardiomyopath Paroxysmal atrial fibrillation  pleural effusion on CT scan Minimal coronary calcification or aortic atherosclerosis Declined catheterization, had Myoview showing no ischemia She was started on carvedilol, Entresto, Lasix Eliquis Was not discharged on Eliquis  Seen in clinic by one of our providers March 24, 2020 Was restarted on Entresto  Creatinine 1.59 in August 2021  BP at home 145/67, Pulse 83  No change in leg edema, active, does ADLs Denies having any symptoms on the Entresto Has not had any blood work since medication was started Denies significant shortness of breath on exertion    Past Medical History:  Diagnosis Date  . B12 deficiency   . CHF (congestive heart failure) ([place])   . CKD (chronic kidney disease), stage III   . Depression   . Endometriosis   . Hypertension   . Mixed hyperlipidemia   . Tobacco abuse    Past Surgical History:  Procedure Laterality Date  . ABDOMINAL HYSTERECTOMY    . BREAST BIOPSY Right 2015   neg-  FIBROADENOMA  . TAH/RSO  1999   secondary to bleeding and endometriosis (Dr Vernie Ammons)      Allergies:   Amoxicillin, Benicar [olmesartan], Codeine sulfate,  Morphine and related, Penicillins, Sulfate, and Clindamycin/lincomycin   Social History   Tobacco Use  . Smoking status: Current Every Day Smoker    Packs/day: 1.00    Years: 40.00    Pack years: 40.00    Types: Cigarettes  . Smokeless tobacco: Never Used  Substance Use Topics  . Alcohol use: Not Currently    Alcohol/week: 0.0 standard drinks    Comment:  occasional  . Drug use: No     Current Outpatient Medications on File Prior to Visit  Medication Sig Dispense Refill  . acetaminophen (TYLENOL) 325 MG tablet Take 650 mg by mouth every 6 (six) hours as needed.    Marland Kitchen apixaban (ELIQUIS) 2.5 MG TABS tablet Take 1 tablet (2.5 mg total) by mouth 2 (two) times daily. 60 tablet 11  . atorvastatin (LIPITOR) 40 MG tablet Take 1 tablet (40 mg total) by mouth daily. 90 tablet 1  . buPROPion (WELLBUTRIN XL) 150 MG 24 hr tablet One tab po daily 90 tablet 1  . carvedilol (COREG) 12.5 MG tablet Take 1 tablet (12.5 mg total) by mouth 2 (two) times daily with a meal. 60 tablet 3  . furosemide (LASIX) 20 MG tablet Take 1 tablet (20 mg total) by mouth daily. 90 tablet 3  . loratadine (CLARITIN) 10 MG tablet Take 10 mg by mouth daily.    Marland Kitchen omeprazole (PRILOSEC) 20 MG capsule Take 20 mg by mouth daily.    . sacubitril-valsartan (ENTRESTO) 24-26 MG Take 1 tablet by mouth 2 (two) times daily. 180 tablet 2  . venlafaxine XR (EFFEXOR-XR) 150 MG 24 hr capsule TAKE 1 CAPSULE BY MOUTH EVERY DAY 90 capsule 1   No current facility-administered medications on file prior to visit.     Family Hx: The patient's family history includes Hypercholesterolemia in her mother. There is no history of Breast cancer or Colon cancer.  ROS:   Please see the history of present illness.    Review of Systems  Constitutional: Negative.   HENT: Negative.   Respiratory: Negative.   Cardiovascular: Negative.   Gastrointestinal: Negative.   Musculoskeletal: Negative.   Neurological: Negative.   Psychiatric/Behavioral: Negative.   All other systems reviewed and are negative.    Labs/Other Tests and Data Reviewed:    Recent Labs: 02/09/2020: ALT 29 02/11/2020: Magnesium 1.7 03/28/2020: B Natriuretic Peptide 2,697.8; BUN 23; Creatinine, Ser 1.59; Hemoglobin 12.6; Platelets 294; Potassium 4.9; Sodium 134   Recent Lipid Panel Lab Results  Component Value Date/Time   CHOL 209 (H)  02/11/2020 04:04 AM   TRIG 62 02/11/2020 04:04 AM   HDL 90 02/11/2020 04:04 AM   CHOLHDL 2.3 02/11/2020 04:04 AM   LDLCALC 107 (H) 02/11/2020 04:04 AM   LDLDIRECT 53.3 10/14/2012 08:21 AM    Wt Readings from Last 3 Encounters:  05/03/20 118 lb (53.5 kg)  04/11/20 120 lb (54.4 kg)  03/28/20 118 lb (53.5 kg)     Exam:    Vital Signs: Vital signs may also be detailed in the HPI BP (!) 145/67 (BP Location: Left Arm, Patient Position: Sitting, Cuff Size: Normal)   Pulse 83   Ht 5\' 5"  (1.651 m)   Wt 118 lb (53.5 kg)   BMI 19.64 kg/m   Wt Readings from Last 3 Encounters:  05/03/20 118 lb (53.5 kg)  04/11/20 120 lb (54.4 kg)  03/28/20 118 lb (53.5 kg)   Temp Readings from Last 3 Encounters:  04/11/20 97.8 F (36.6 C) (Oral)  02/13/20 98.4 F (36.9  C)  03/03/19 97.9 F (36.6 C) (Oral)   BP Readings from Last 3 Encounters:  05/03/20 (!) 145/67  04/11/20 132/72  03/28/20 (!) 152/77   Pulse Readings from Last 3 Encounters:  05/03/20 83  04/11/20 83  03/28/20 96      no acute distress. Constitutional:  oriented to person, place, and time. No distress.    ASSESSMENT & PLAN:    Problem List Items Addressed This Visit    None    Visit Diagnoses    Chronic systolic CHF (congestive heart failure) (HCC)    -  Primary   Dilated cardiomyopathy (HCC)       Chronic renal impairment, stage 3b       Smoker         Dilated cardiomyopathy Presumed to be nonischemic, possibly hypertensive heart disease given chronic hypertension Reports he is tolerating low-dose Entresto, also on carvedilol 12.5 twice daily Lasix daily We have ordered BMP today given she has started  Entresto 6 weeks ago Recommended she increase carvedilol up to 18.75 mg twice daily Continue Lasix at current dose --Depending on blood pressure and heart rate, and renal function would look to either increase the Entresto or increase carvedilol up to 25 twice daily For worsening renal function, electrolyte  issues or bradycardia, may need to start hydralazine or isosorbide We will avoid spironolactone in the setting of renal failure and Entresto  Paroxysmal atrial fibrillation Continue low-dose Eliquis, increase carvedilol   COVID-19 Education: The signs and symptoms of COVID-19 were discussed with the patient and how to seek care for testing (follow up with PCP or arrange E-visit).  The importance of social distancing was discussed today.  Patient Risk:   After full review of this patients clinical status, I feel that they are at least moderate risk at this time.  Time:   Today, I have spent 25 minutes with the patient with telehealth technology discussing the cardiac and medical problems/diagnoses detailed above   Additional 10 min spent reviewing the chart prior to patient visit today   Medication Adjustments/Labs and Tests Ordered: Current medicines are reviewed at length with the patient today.  Concerns regarding medicines are outlined above.   Tests Ordered: No tests ordered   Medication Changes: No changes made   Disposition: Follow-up in 12 months   Signed, Ida Rogue, MD  [place] Office [address], [place], [place] [address]

## 2020-05-11 ENCOUNTER — Other Ambulatory Visit: Payer: BC Managed Care – PPO

## 2020-05-29 ENCOUNTER — Ambulatory Visit: Payer: BC Managed Care – PPO | Admitting: Family

## 2020-06-03 ENCOUNTER — Other Ambulatory Visit: Payer: Self-pay | Admitting: Internal Medicine

## 2020-06-15 ENCOUNTER — Emergency Department: Payer: BC Managed Care – PPO

## 2020-06-15 ENCOUNTER — Other Ambulatory Visit: Payer: Self-pay

## 2020-06-15 DIAGNOSIS — Z83438 Family history of other disorder of lipoprotein metabolism and other lipidemia: Secondary | ICD-10-CM

## 2020-06-15 DIAGNOSIS — Z885 Allergy status to narcotic agent status: Secondary | ICD-10-CM

## 2020-06-15 DIAGNOSIS — E162 Hypoglycemia, unspecified: Secondary | ICD-10-CM | POA: Diagnosis present

## 2020-06-15 DIAGNOSIS — Z882 Allergy status to sulfonamides status: Secondary | ICD-10-CM

## 2020-06-15 DIAGNOSIS — F32A Depression, unspecified: Secondary | ICD-10-CM | POA: Diagnosis present

## 2020-06-15 DIAGNOSIS — N179 Acute kidney failure, unspecified: Secondary | ICD-10-CM | POA: Diagnosis present

## 2020-06-15 DIAGNOSIS — A072 Cryptosporidiosis: Secondary | ICD-10-CM | POA: Diagnosis not present

## 2020-06-15 DIAGNOSIS — Z20822 Contact with and (suspected) exposure to covid-19: Secondary | ICD-10-CM | POA: Diagnosis present

## 2020-06-15 DIAGNOSIS — Z880 Allergy status to penicillin: Secondary | ICD-10-CM

## 2020-06-15 DIAGNOSIS — I5042 Chronic combined systolic (congestive) and diastolic (congestive) heart failure: Secondary | ICD-10-CM | POA: Diagnosis present

## 2020-06-15 DIAGNOSIS — Z79899 Other long term (current) drug therapy: Secondary | ICD-10-CM

## 2020-06-15 DIAGNOSIS — N1832 Chronic kidney disease, stage 3b: Secondary | ICD-10-CM | POA: Diagnosis present

## 2020-06-15 DIAGNOSIS — E860 Dehydration: Secondary | ICD-10-CM | POA: Diagnosis not present

## 2020-06-15 DIAGNOSIS — E538 Deficiency of other specified B group vitamins: Secondary | ICD-10-CM | POA: Diagnosis present

## 2020-06-15 DIAGNOSIS — I493 Ventricular premature depolarization: Secondary | ICD-10-CM | POA: Diagnosis present

## 2020-06-15 DIAGNOSIS — I480 Paroxysmal atrial fibrillation: Secondary | ICD-10-CM | POA: Diagnosis present

## 2020-06-15 DIAGNOSIS — E872 Acidosis: Secondary | ICD-10-CM | POA: Diagnosis present

## 2020-06-15 DIAGNOSIS — E782 Mixed hyperlipidemia: Secondary | ICD-10-CM | POA: Diagnosis present

## 2020-06-15 DIAGNOSIS — K219 Gastro-esophageal reflux disease without esophagitis: Secondary | ICD-10-CM | POA: Diagnosis present

## 2020-06-15 DIAGNOSIS — F17210 Nicotine dependence, cigarettes, uncomplicated: Secondary | ICD-10-CM | POA: Diagnosis present

## 2020-06-15 DIAGNOSIS — Z7901 Long term (current) use of anticoagulants: Secondary | ICD-10-CM

## 2020-06-15 DIAGNOSIS — F1011 Alcohol abuse, in remission: Secondary | ICD-10-CM | POA: Diagnosis present

## 2020-06-15 DIAGNOSIS — I248 Other forms of acute ischemic heart disease: Secondary | ICD-10-CM | POA: Diagnosis present

## 2020-06-15 DIAGNOSIS — I130 Hypertensive heart and chronic kidney disease with heart failure and stage 1 through stage 4 chronic kidney disease, or unspecified chronic kidney disease: Secondary | ICD-10-CM | POA: Diagnosis present

## 2020-06-15 DIAGNOSIS — E876 Hypokalemia: Secondary | ICD-10-CM | POA: Diagnosis present

## 2020-06-15 LAB — CBC
HCT: 44.7 % (ref 36.0–46.0)
Hemoglobin: 14.8 g/dL (ref 12.0–15.0)
MCH: 26.9 pg (ref 26.0–34.0)
MCHC: 33.1 g/dL (ref 30.0–36.0)
MCV: 81.3 fL (ref 80.0–100.0)
Platelets: 171 10*3/uL (ref 150–400)
RBC: 5.5 MIL/uL — ABNORMAL HIGH (ref 3.87–5.11)
RDW: 19.3 % — ABNORMAL HIGH (ref 11.5–15.5)
WBC: 18.9 10*3/uL — ABNORMAL HIGH (ref 4.0–10.5)
nRBC: 0 % (ref 0.0–0.2)

## 2020-06-15 LAB — COMPREHENSIVE METABOLIC PANEL
ALT: 12 U/L (ref 0–44)
AST: 16 U/L (ref 15–41)
Albumin: 4.2 g/dL (ref 3.5–5.0)
Alkaline Phosphatase: 85 U/L (ref 38–126)
Anion gap: 14 (ref 5–15)
BUN: 46 mg/dL — ABNORMAL HIGH (ref 8–23)
CO2: 16 mmol/L — ABNORMAL LOW (ref 22–32)
Calcium: 9 mg/dL (ref 8.9–10.3)
Chloride: 104 mmol/L (ref 98–111)
Creatinine, Ser: 2.49 mg/dL — ABNORMAL HIGH (ref 0.44–1.00)
GFR, Estimated: 21 mL/min — ABNORMAL LOW (ref >60)
Glucose, Bld: 60 mg/dL — ABNORMAL LOW (ref 70–99)
Potassium: 3.3 mmol/L — ABNORMAL LOW (ref 3.5–5.1)
Sodium: 134 mmol/L — ABNORMAL LOW (ref 135–145)
Total Bilirubin: 0.8 mg/dL (ref 0.3–1.2)
Total Protein: 7.5 g/dL (ref 6.5–8.1)

## 2020-06-15 LAB — TROPONIN I (HIGH SENSITIVITY): Troponin I (High Sensitivity): 21 ng/L — ABNORMAL HIGH (ref <18)

## 2020-06-15 NOTE — ED Triage Notes (Signed)
EMS brought pt in from home for c/o of dizziness and nausea since Monday; pt to lobby via w/c with no distress noted

## 2020-06-15 NOTE — ED Triage Notes (Signed)
Pt brought in by [facility] for 3 days states has had nausea, vomited x 1. Denies any fever or cold symptoms, no hx of the same. States has had decreased appetite for few days. Has had diarrhea for 3 days states multiple episodes today.

## 2020-06-16 ENCOUNTER — Inpatient Hospital Stay
Admission: EM | Admit: 2020-06-16 | Discharge: 2020-06-19 | DRG: 372 | Disposition: A | Discharge status: Home / Self Care | Payer: BC Managed Care – PPO | Attending: Internal Medicine | Admitting: Internal Medicine

## 2020-06-16 ENCOUNTER — Other Ambulatory Visit: Payer: Self-pay | Admitting: Internal Medicine

## 2020-06-16 DIAGNOSIS — I10 Essential (primary) hypertension: Secondary | ICD-10-CM | POA: Diagnosis present

## 2020-06-16 DIAGNOSIS — A072 Cryptosporidiosis: Secondary | ICD-10-CM

## 2020-06-16 DIAGNOSIS — R778 Other specified abnormalities of plasma proteins: Secondary | ICD-10-CM | POA: Diagnosis present

## 2020-06-16 DIAGNOSIS — E162 Hypoglycemia, unspecified: Secondary | ICD-10-CM | POA: Diagnosis present

## 2020-06-16 DIAGNOSIS — I5042 Chronic combined systolic (congestive) and diastolic (congestive) heart failure: Secondary | ICD-10-CM | POA: Diagnosis not present

## 2020-06-16 DIAGNOSIS — K529 Noninfective gastroenteritis and colitis, unspecified: Secondary | ICD-10-CM

## 2020-06-16 DIAGNOSIS — E860 Dehydration: Secondary | ICD-10-CM

## 2020-06-16 DIAGNOSIS — R197 Diarrhea, unspecified: Secondary | ICD-10-CM

## 2020-06-16 DIAGNOSIS — R001 Bradycardia, unspecified: Secondary | ICD-10-CM

## 2020-06-16 DIAGNOSIS — F32A Depression, unspecified: Secondary | ICD-10-CM | POA: Diagnosis present

## 2020-06-16 DIAGNOSIS — I5043 Acute on chronic combined systolic (congestive) and diastolic (congestive) heart failure: Secondary | ICD-10-CM | POA: Diagnosis present

## 2020-06-16 DIAGNOSIS — R112 Nausea with vomiting, unspecified: Secondary | ICD-10-CM | POA: Diagnosis present

## 2020-06-16 DIAGNOSIS — E785 Hyperlipidemia, unspecified: Secondary | ICD-10-CM | POA: Diagnosis present

## 2020-06-16 DIAGNOSIS — F320 Major depressive disorder, single episode, mild: Secondary | ICD-10-CM | POA: Diagnosis present

## 2020-06-16 DIAGNOSIS — N1832 Chronic kidney disease, stage 3b: Secondary | ICD-10-CM

## 2020-06-16 DIAGNOSIS — R7989 Other specified abnormal findings of blood chemistry: Secondary | ICD-10-CM | POA: Diagnosis present

## 2020-06-16 DIAGNOSIS — N179 Acute kidney failure, unspecified: Secondary | ICD-10-CM | POA: Diagnosis not present

## 2020-06-16 DIAGNOSIS — R42 Dizziness and giddiness: Secondary | ICD-10-CM

## 2020-06-16 DIAGNOSIS — D72829 Elevated white blood cell count, unspecified: Secondary | ICD-10-CM | POA: Diagnosis present

## 2020-06-16 DIAGNOSIS — K219 Gastro-esophageal reflux disease without esophagitis: Secondary | ICD-10-CM | POA: Diagnosis present

## 2020-06-16 DIAGNOSIS — Z720 Tobacco use: Secondary | ICD-10-CM | POA: Diagnosis present

## 2020-06-16 LAB — GASTROINTESTINAL PANEL BY PCR, STOOL (REPLACES STOOL CULTURE)

## 2020-06-16 LAB — URINALYSIS, COMPLETE (UACMP) WITH MICROSCOPIC
Bilirubin Urine: NEGATIVE
Glucose, UA: NEGATIVE mg/dL
Hgb urine dipstick: NEGATIVE
Ketones, ur: 5 mg/dL — AB
Leukocytes,Ua: NEGATIVE
Nitrite: NEGATIVE
Protein, ur: 30 mg/dL — AB
Specific Gravity, Urine: 1.004 — ABNORMAL LOW (ref 1.005–1.030)
pH: 6 (ref 5.0–8.0)

## 2020-06-16 LAB — CBC WITH DIFFERENTIAL/PLATELET
Abs Immature Granulocytes: 0.22 10*3/uL — ABNORMAL HIGH (ref 0.00–0.07)
Basophils Absolute: 0.1 10*3/uL (ref 0.0–0.1)
Basophils Relative: 1 %
Eosinophils Absolute: 0.1 10*3/uL (ref 0.0–0.5)
Eosinophils Relative: 0 %
HCT: 40.7 % (ref 36.0–46.0)
Hemoglobin: 13.9 g/dL (ref 12.0–15.0)
Immature Granulocytes: 1 %
Lymphocytes Relative: 8 %
Lymphs Abs: 1.6 10*3/uL (ref 0.7–4.0)
MCH: 26.9 pg (ref 26.0–34.0)
MCHC: 34.2 g/dL (ref 30.0–36.0)
MCV: 78.9 fL — ABNORMAL LOW (ref 80.0–100.0)
Monocytes Absolute: 1.6 10*3/uL — ABNORMAL HIGH (ref 0.1–1.0)
Monocytes Relative: 7 %
Neutro Abs: 18.2 10*3/uL — ABNORMAL HIGH (ref 1.7–7.7)
Neutrophils Relative %: 83 %
Platelets: 128 10*3/uL — ABNORMAL LOW (ref 150–400)
RBC: 5.16 MIL/uL — ABNORMAL HIGH (ref 3.87–5.11)
RDW: 18.6 % — ABNORMAL HIGH (ref 11.5–15.5)
WBC: 21.8 10*3/uL — ABNORMAL HIGH (ref 4.0–10.5)
nRBC: 0 % (ref 0.0–0.2)

## 2020-06-16 LAB — TROPONIN I (HIGH SENSITIVITY)
Troponin I (High Sensitivity): 23 ng/L — ABNORMAL HIGH (ref <18)
Troponin I (High Sensitivity): 26 ng/L — ABNORMAL HIGH (ref <18)
Troponin I (High Sensitivity): 29 ng/L — ABNORMAL HIGH (ref <18)
Troponin I (High Sensitivity): 27 ng/L — ABNORMAL HIGH (ref <18)
Troponin I (High Sensitivity): 27 ng/L — ABNORMAL HIGH (ref <18)

## 2020-06-16 LAB — GLUCOSE, CAPILLARY
Glucose-Capillary: 39 mg/dL — CL (ref 70–99)
Glucose-Capillary: 101 mg/dL — ABNORMAL HIGH (ref 70–99)
Glucose-Capillary: 35 mg/dL — CL (ref 70–99)
Glucose-Capillary: 145 mg/dL — ABNORMAL HIGH (ref 70–99)
Glucose-Capillary: 183 mg/dL — ABNORMAL HIGH (ref 70–99)

## 2020-06-16 LAB — RESPIRATORY PANEL BY RT PCR (FLU A&B, COVID)
Influenza A by PCR: NEGATIVE
Influenza B by PCR: NEGATIVE
SARS Coronavirus 2 by RT PCR: NEGATIVE

## 2020-06-16 LAB — BASIC METABOLIC PANEL
Anion gap: 15 (ref 5–15)
BUN: 54 mg/dL — ABNORMAL HIGH (ref 8–23)
CO2: 13 mmol/L — ABNORMAL LOW (ref 22–32)
Calcium: 8.3 mg/dL — ABNORMAL LOW (ref 8.9–10.3)
Chloride: 101 mmol/L (ref 98–111)
Creatinine, Ser: 2.53 mg/dL — ABNORMAL HIGH (ref 0.44–1.00)
GFR, Estimated: 21 mL/min — ABNORMAL LOW (ref >60)
Glucose, Bld: 45 mg/dL — ABNORMAL LOW (ref 70–99)
Potassium: 4.3 mmol/L (ref 3.5–5.1)
Sodium: 129 mmol/L — ABNORMAL LOW (ref 135–145)

## 2020-06-16 LAB — C DIFFICILE QUICK SCREEN W PCR REFLEX
C Diff antigen: NEGATIVE
C Diff interpretation: NOT DETECTED
C Diff toxin: NEGATIVE

## 2020-06-16 LAB — LIPASE, BLOOD: Lipase: 19 U/L (ref 11–51)

## 2020-06-16 LAB — MAGNESIUM: Magnesium: 2.4 mg/dL (ref 1.7–2.4)

## 2020-06-16 LAB — BRAIN NATRIURETIC PEPTIDE: B Natriuretic Peptide: 115.6 pg/mL — ABNORMAL HIGH (ref 0.0–100.0)

## 2020-06-16 MED ORDER — LORATADINE 10 MG PO TABS
10.0000 mg | ORAL_TABLET | Freq: Every day | ORAL | Status: DC
  Administered 2020-06-16 – 2020-06-19 (×4): 10 mg via ORAL
  Filled 2020-06-16 (×4): qty 1

## 2020-06-16 MED ORDER — POTASSIUM CHLORIDE 20 MEQ PO PACK
40.0000 meq | PACK | Freq: Once | ORAL | Status: AC
  Administered 2020-06-16: 40 meq via ORAL
  Filled 2020-06-16: qty 2

## 2020-06-16 MED ORDER — AMLODIPINE BESYLATE 5 MG PO TABS
5.0000 mg | ORAL_TABLET | Freq: Every day | ORAL | Status: DC
  Administered 2020-06-16 – 2020-06-19 (×3): 5 mg via ORAL
  Filled 2020-06-16 (×3): qty 1

## 2020-06-16 MED ORDER — CARVEDILOL 12.5 MG PO TABS
12.5000 mg | ORAL_TABLET | Freq: Two times a day (BID) | ORAL | Status: DC
  Administered 2020-06-16: 12.5 mg via ORAL
  Filled 2020-06-16: qty 1

## 2020-06-16 MED ORDER — APIXABAN 2.5 MG PO TABS
2.5000 mg | ORAL_TABLET | Freq: Two times a day (BID) | ORAL | Status: DC
  Administered 2020-06-16 – 2020-06-18 (×4): 2.5 mg via ORAL
  Filled 2020-06-16 (×4): qty 1

## 2020-06-16 MED ORDER — ATORVASTATIN CALCIUM 20 MG PO TABS
40.0000 mg | ORAL_TABLET | Freq: Every day | ORAL | Status: DC
  Administered 2020-06-16 – 2020-06-19 (×4): 40 mg via ORAL
  Filled 2020-06-16 (×4): qty 2

## 2020-06-16 MED ORDER — PANTOPRAZOLE SODIUM 40 MG PO TBEC
40.0000 mg | DELAYED_RELEASE_TABLET | Freq: Every day | ORAL | Status: DC
  Administered 2020-06-16 – 2020-06-19 (×4): 40 mg via ORAL
  Filled 2020-06-16 (×4): qty 1

## 2020-06-16 MED ORDER — DEXTROSE 50 % IV SOLN
50.0000 mL | INTRAVENOUS | Status: DC | PRN
  Administered 2020-06-16: 50 mL via INTRAVENOUS
  Filled 2020-06-16: qty 50

## 2020-06-16 MED ORDER — MAGNESIUM SULFATE 2 GM/50ML IV SOLN
2.0000 g | Freq: Once | INTRAVENOUS | Status: AC
  Administered 2020-06-16: 2 g via INTRAVENOUS
  Filled 2020-06-16: qty 50

## 2020-06-16 MED ORDER — VENLAFAXINE HCL ER 75 MG PO CP24
150.0000 mg | ORAL_CAPSULE | Freq: Every day | ORAL | Status: DC
  Administered 2020-06-16 – 2020-06-19 (×4): 150 mg via ORAL
  Filled 2020-06-16 (×4): qty 2

## 2020-06-16 MED ORDER — BUPROPION HCL ER (XL) 150 MG PO TB24
150.0000 mg | ORAL_TABLET | Freq: Every day | ORAL | Status: DC
  Administered 2020-06-16 – 2020-06-19 (×4): 150 mg via ORAL
  Filled 2020-06-16 (×4): qty 1

## 2020-06-16 MED ORDER — SODIUM CHLORIDE 0.9 % IV SOLN: INTRAVENOUS | Status: DC

## 2020-06-16 MED ORDER — ONDANSETRON HCL 4 MG/2ML IJ SOLN: 4.0000 mg | Freq: Three times a day (TID) | INTRAMUSCULAR | Status: DC | PRN

## 2020-06-16 MED ORDER — POTASSIUM CHLORIDE 10 MEQ/100ML IV SOLN
10.0000 meq | INTRAVENOUS | Status: AC
  Administered 2020-06-16 (×2): 10 meq via INTRAVENOUS
  Filled 2020-06-16 (×2): qty 100

## 2020-06-16 MED ORDER — ACETAMINOPHEN 325 MG PO TABS: 650.0000 mg | ORAL_TABLET | Freq: Four times a day (QID) | ORAL | Status: DC | PRN

## 2020-06-16 MED ORDER — HYDRALAZINE HCL 20 MG/ML IJ SOLN: 5.0000 mg | INTRAMUSCULAR | Status: DC | PRN

## 2020-06-16 MED ORDER — LACTATED RINGERS IV BOLUS
1000.0000 mL | Freq: Once | INTRAVENOUS | Status: AC
  Administered 2020-06-16: 1000 mL via INTRAVENOUS

## 2020-06-16 MED ORDER — NICOTINE 21 MG/24HR TD PT24
21.0000 mg | MEDICATED_PATCH | Freq: Every day | TRANSDERMAL | Status: DC
  Administered 2020-06-16 – 2020-06-19 (×4): 21 mg via TRANSDERMAL
  Filled 2020-06-16 (×4): qty 1

## 2020-06-16 NOTE — ED Provider Notes (Signed)
[facility] Emergency Department Provider Note ____________________________________________   First MD Initiated Contact with Patient 06/16/20 [phone]     (approximate)  I have reviewed the triage vital signs and the nursing notes.  HISTORY  Chief Complaint Dizziness   HPI Dawn Sherman is a 64 y.o. femalewho presents to the ED for evaluation of dizziness.  Chart review indicates history of HTN, HLD, GERD, CKD 3, mixed nonischemic systolic/diastolic CHF with EF 62%, paroxysmal A. fib on Eliquis. Lives at home with husband and grandchildren, but no one has been sick recently.  Patient presents to the ED for evaluation of persistent lightheaded dizziness in the setting of 5 days of nausea, watery diarrhea and occasional episodes of nonbloody nonbilious emesis.  She denies any sick contacts at home, but reports persistent diarrhea and loose stool every time she tries to ingest food.  Patient denies any abdominal pain, chest pain, syncope, palpitations or shortness of breath.  She denies any falls or trauma.  She reports intermittent presyncopal lightheaded dizziness at home, primarily when she stands or changes positions, and denies any of these episodes while at rest.  She reports that she thought the sensations would resolve by the end of the week, but because that happened, she presented last night to the ED for evaluation.  Of note, patient waited in our waiting room for about 14 hours prior to my evaluation due to prolonged wait times and admission holds.  She denies any syncopal episodes or chest pain in that timeframe.    Past Medical History:  Diagnosis Date  . B12 deficiency   . CHF (congestive heart failure) ([place])   . CKD (chronic kidney disease), stage III   . Depression   . Endometriosis   . Hypertension   . Mixed hyperlipidemia   . Tobacco abuse     Patient Active Problem List   Diagnosis Date Noted  . Nausea vomiting and diarrhea 06/16/2020  .  Hypoglycemia 06/16/2020  . Chronic combined systolic and diastolic CHF (congestive heart failure) ([place]) 02/10/2020  . Leukocytosis 02/10/2020  . HLD (hyperlipidemia) 02/10/2020  . Elevated troponin 02/10/2020  . GERD (gastroesophageal reflux disease) 02/10/2020  . Acute respiratory failure with hypoxia ([place]) 02/10/2020  . Abnormal mammogram 07/13/2019  . Sleep concern 10/17/2018  . History of alcohol abuse 07/31/2018  . B12 deficiency 07/31/2018  . Acute renal failure superimposed on stage 3b chronic kidney disease ([place]) 11/03/2016  . Health care maintenance 12/02/2014  . Tobacco abuse 12/02/2014  . Foot pain, left 11/29/2014  . Hypercholesterolemia 11/29/2014  . Encounter for screening colonoscopy 12/16/2013  . Lump or mass in breast 12/16/2013  . Breast mass 12/15/2012  . Hypertension 07/05/2012  . Mild depression ([place]) 07/05/2012    Past Surgical History:  Procedure Laterality Date  . ABDOMINAL HYSTERECTOMY    . BREAST BIOPSY Right 2015   neg-  FIBROADENOMA  . TAH/RSO  1999   secondary to bleeding and endometriosis (Dr Vernie Ammons)    Prior to Admission medications   Medication Sig Start Date End Date Taking? Authorizing Provider  buPROPion (WELLBUTRIN XL) 150 MG 24 hr tablet TAKE 1 TABLET BY MOUTH EVERY DAY 06/16/20   [name], MD  acetaminophen (TYLENOL) 325 MG tablet Take 650 mg by mouth every 6 (six) hours as needed.    [provider]  apixaban (ELIQUIS) 2.5 MG TABS tablet Take 1 tablet (2.5 mg total) by mouth 2 (two) times daily. 02/21/20   Loel Dubonnet, NP  atorvastatin (LIPITOR) 40 MG tablet Take 1 tablet (40 mg total) by mouth daily. 02/14/20   Rai, Vernelle Emerald, MD  carvedilol (COREG) 12.5 MG tablet Take 1.5 tablets (18.75 mg total) by mouth 2 (two) times daily with a meal. 05/03/20   Gollan, Kathlene November, MD  furosemide (LASIX) 20 MG tablet Take 1 tablet (20 mg total) by mouth daily. 02/13/20 02/12/21  Rai, Vernelle Emerald, MD  loratadine (CLARITIN) 10 MG  tablet Take 10 mg by mouth daily.    [provider]  omeprazole (PRILOSEC) 20 MG capsule Take 20 mg by mouth daily.    [provider]  sacubitril-valsartan (ENTRESTO) 24-26 MG Take 1 tablet by mouth 2 (two) times daily. 02/13/20   Ahmed Prima, Fransisco Hertz, PA-C  venlafaxine XR (EFFEXOR-XR) 150 MG 24 hr capsule TAKE 1 CAPSULE BY MOUTH EVERY DAY 06/05/20   [name], MD    Allergies Amoxicillin, Benicar [olmesartan], Codeine sulfate, Morphine and related, Penicillins, Sulfate, and Clindamycin/lincomycin  Family History  Problem Relation Age of Onset  . Hypercholesterolemia Mother   . Breast cancer Neg Hx   . Colon cancer Neg Hx     Social History Social History   Tobacco Use  . Smoking status: Current Every Day Smoker    Packs/day: 1.00    Years: 40.00    Pack years: 40.00    Types: Cigarettes  . Smokeless tobacco: Never Used  Substance Use Topics  . Alcohol use: Not Currently    Alcohol/week: 0.0 standard drinks    Comment: occasional  . Drug use: No    Review of Systems  Constitutional: No fever/chills Eyes: No visual changes. ENT: No sore throat. Cardiovascular: Denies chest pain. Respiratory: Denies shortness of breath. Gastrointestinal: No abdominal pain. No constipation. Positive for nausea, vomiting and diarrhea. Genitourinary: Negative for dysuria. Musculoskeletal: Negative for back pain. Skin: Negative for rash. Neurological: Negative for headaches, focal weakness or numbness.  Positive for lightheaded dizziness and presyncope without syncope.  ____________________________________________   PHYSICAL EXAM:  VITAL SIGNS: Vitals:   06/16/20 0815 06/16/20 1000  BP: 116/61 130/67  Pulse: 75 (!) 49  Resp: 15 18  Temp: 97.7 F (36.5 C)   SpO2: 100% 100%      Constitutional: Alert and oriented. Well appearing and in no acute distress.  Sitting up in bed, pleasant and conversational full sentences. Eyes: Conjunctivae are normal.  PERRL. EOMI. Head: Atraumatic. Nose: No congestion/rhinnorhea. Mouth/Throat: Mucous membranes are dry.  Oropharynx non-erythematous. Neck: No stridor. No cervical spine tenderness to palpation. Cardiovascular: Normal rate, regular rhythm. Grossly normal heart sounds.  Good peripheral circulation. Significant ectopy noted on the monitor ranging from sinus bradycardia to normal sinus rhythm.  PVCs and abnormal ST changes are obvious on telemetry.  Patient has no chest pain during this. Respiratory: Normal respiratory effort.  No retractions. Lungs CTAB. Gastrointestinal: Soft , nondistended, nontender to palpation. No abdominal bruits. No CVA tenderness. Musculoskeletal: No lower extremity tenderness nor edema.  No joint effusions. No signs of acute trauma. Neurologic:  Normal speech and language. No gross focal neurologic deficits are appreciated. No gait instability noted. Skin:  Skin is warm, dry and intact. No rash noted.  Skin tenting is present to skin of bilateral dorsal hands. Psychiatric: Mood and affect are normal. Speech and behavior are normal.  ____________________________________________   LABS (all labs ordered are listed, but only abnormal results are displayed)  Labs Reviewed  CBC - Abnormal; Notable for the following components:      Result Value  WBC 18.9 (*)    RBC 5.50 (*)    RDW 19.3 (*)    All other components within normal limits  COMPREHENSIVE METABOLIC PANEL - Abnormal; Notable for the following components:   Sodium 134 (*)    Potassium 3.3 (*)    CO2 16 (*)    Glucose, Bld 60 (*)    BUN 46 (*)    Creatinine, Ser 2.49 (*)    GFR, Estimated 21 (*)    All other components within normal limits  CBC WITH DIFFERENTIAL/PLATELET - Abnormal; Notable for the following components:   WBC 21.8 (*)    RBC 5.16 (*)    MCV 78.9 (*)    RDW 18.6 (*)    Platelets 128 (*)    Neutro Abs 18.2 (*)    Monocytes Absolute 1.6 (*)    Abs Immature Granulocytes 0.22 (*)     All other components within normal limits  TROPONIN I (HIGH SENSITIVITY) - Abnormal; Notable for the following components:   Troponin I (High Sensitivity) 21 (*)    All other components within normal limits  TROPONIN I (HIGH SENSITIVITY) - Abnormal; Notable for the following components:   Troponin I (High Sensitivity) 23 (*)    All other components within normal limits  TROPONIN I (HIGH SENSITIVITY) - Abnormal; Notable for the following components:   Troponin I (High Sensitivity) 26 (*)    All other components within normal limits  RESPIRATORY PANEL BY RT PCR (FLU A&B, COVID)  LIPASE, BLOOD  URINALYSIS, COMPLETE (UACMP) WITH MICROSCOPIC  MAGNESIUM  BASIC METABOLIC PANEL  BRAIN NATRIURETIC PEPTIDE   ____________________________________________  12 Lead EKG  Twelve-lead EKG at triage last night demonstrates sinus rhythm with a rate of 83 bpm.  Normal axis.  First-degree AV block with PR of 360 ms.  Lateral T wave inversions and ST depressions without STEMI criteria.  Repeat twelve-lead EKG performed at 9:41 AM demonstrates sinus bradycardia with normal axis.  Prolonged PR interval of 350 ms.  Otherwise normal intervals.  Lateral T wave inversions and ST depressions without reciprocal ST elevations or evidence of STEMI criteria. ____________________________________________  RADIOLOGY  ED MD interpretation: 2 view CXR reviewed by me with hyperinflated lungs without evidence of acute cardiopulmonary pathology.  Official radiology report(s): DG Chest 2 View  Result Date: 06/15/2020 CLINICAL DATA:  Dizziness EXAM: CHEST - 2 VIEW COMPARISON:  02/09/2020 FINDINGS: There is hyperinflation of the lungs compatible with COPD. Heart and mediastinal contours are within normal limits. No focal opacities or effusions. No acute bony abnormality. Scoliosis. IMPRESSION: COPD.  No active disease. Electronically Signed   By: Rolm Baptise M.D.   On: 06/15/2020 20:32     ____________________________________________   PROCEDURES and INTERVENTIONS  Procedure(s) performed (including Critical Care):  .1-3 Lead EKG Interpretation Performed by: Vladimir Crofts, MD Authorized by: Vladimir Crofts, MD     Interpretation: abnormal     ECG rate:  40   ECG rate assessment: bradycardic     Rhythm: sinus bradycardia     Ectopy: PVCs     Conduction: normal   Comments:     Significant ectopy in the monitor with frequent PVCs and obviously abnormal ST segment changes.     Medications  potassium chloride 10 mEq in 100 mL IVPB (10 mEq Intravenous New Bag/Given 06/16/20 1115)  magnesium sulfate IVPB 2 g 50 mL (2 g Intravenous New Bag/Given 06/16/20 1103)  dextrose 50 % solution 50 mL (has no administration in time range)  lactated ringers  bolus 1,000 mL (1,000 mLs Intravenous New Bag/Given 06/16/20 1010)  potassium chloride (KLOR-CON) packet 40 mEq (40 mEq Oral Given 06/16/20 1004)    ____________________________________________   MDM / ED COURSE  64 year old woman with reduced ejection fraction presents with evidence of severe dehydration, AKI and electrolyte disturbances in the setting of diarrheal illness requiring medical admission.  Hemodynamically stable, and intermittently reducing heart rate to sinus bradycardia with significant ectopy.  Exam demonstrates stigmata of dehydration, otherwise she looks well and has no evidence of acute pathology.  She is benign abdomen and has had no abdominal pain with her symptoms, just nausea and frequent diarrhea.  Blood work demonstrates significant electrolyte disturbances, evidence of dehydration and acute renal failure that appears to be prerenal in etiology.  Started with 1 L of LR due to her known heart failure, and initiated electrolyte repletion with oral and IV potassium and magnesium supplementation.  Patient has no chest pain no STEMI criteria, but some ischemic changes to her EKG.  We will keep her on telemetry,  serially check her troponins and admit to hospitalist medicine for further work-up and management.  Clinical Course as of Jun 16 1149  Fri Jun 16, 2020  0955 Significant ectopy on the monitor and concerning ST changes on telemetry.  Repeat EKG printed out by RN at 941 demonstrates sinus bradycardia without STEMI, but does show ischemic changes.  I immediately reevaluate the patient and she denies any chest pain, chest pressure and says she feels well at this time.  We will initiate fluid resuscitation and electrolyte repletion, educate the patient of my recommendation for inpatient stay and she is agreeable.   [DS]    Clinical Course User Index [DS] Vladimir Crofts, MD     ____________________________________________   FINAL CLINICAL IMPRESSION(S) / ED DIAGNOSES  Final diagnoses:  Dizziness  Dehydration  AKI (acute kidney injury) ([place])  Sinus bradycardia  Diarrheal disease     ED Discharge Orders    None       Vester Titsworth   Note:  This document was prepared using Dragon voice recognition software and may include unintentional dictation errors.   Vladimir Crofts, MD 06/16/20 [phone]

## 2020-06-16 NOTE — ED Notes (Signed)
Attempted to call report to floor, will call again in 15 minutes

## 2020-06-16 NOTE — ED Notes (Signed)
Lab at bedside to collect cultures °

## 2020-06-16 NOTE — ED Notes (Signed)
Pt unable to provide urine sample at this time d/t diarrhea

## 2020-06-16 NOTE — ED Notes (Signed)
cbg 101 

## 2020-06-16 NOTE — ED Notes (Signed)
Lab contacted to collect cultures

## 2020-06-16 NOTE — ED Notes (Addendum)
cbg 35, amp of dextrose given. Pt continues to be alert and oriented.

## 2020-06-16 NOTE — H&P (Signed)
History and Physical    Dawn Sherman OXB:[id number] DOB: 12/10/1955 DOA: 06/16/2020  Referring MD/NP/PA:   PCP: [name], MD   Patient coming from:  The patient is coming from home.  At baseline, pt is independent for most of ADL.        Chief Complaint: Nausea, vomiting, diarrhea  HPI: Dawn Sherman is a 64 y.o. female with medical history significant of hypertension, hyperlipidemia, GERD, depression, tobacco abuse, alcohol abuse in remission, CKD stage IIIb, CHF with EF 30-35%, PAF on Eliquis, who presents with nausea vomiting and diarrhea.  Patient states that she has been having nausea vomiting and diarrhea for more than 3 days.  She has had more than 5 times of watery diarrhea today today.  She has had one time of nonbilious nonbloody vomiting.  Denies abdominal pain.  No fever or chills.  Patient does not have chest pain, cough, shortness of breath.  No symptoms of UTI.  Patient has lightheadedness and dizziness.  No syncope.  No fall.  ED Course: pt was found to have WBC 21.8, troponin 21 -->23, negative Covid PCR, worsening renal function, temperature normal, blood pressure 130/67, heart rate 49, 72, RR 18, oxygen saturation 98% on room air.  Patient is placed on MedSurg bed for observation.  Review of Systems:   General: no fevers, chills, no body weight gain, has poor appetite, has fatigue HEENT: no blurry vision, hearing changes or sore throat Respiratory: no dyspnea, coughing, wheezing CV: no chest pain, no palpitations GI: has nausea, vomiting, abdominal pain, diarrhea, no constipation GU: no dysuria, burning on urination, increased urinary frequency, hematuria  Ext: no leg edema Neuro: no unilateral weakness, numbness, or tingling, no vision change or hearing loss Skin: no rash, no skin tear. MSK: No muscle spasm, no deformity, no limitation of range of movement in spin Heme: No easy bruising.  Travel history: No recent long distant travel.  Allergy:    Allergies  Allergen Reactions  . Amoxicillin   . Benicar [Olmesartan]     Talked with patient February 10, 2020, intolerance is unclear, tried several medications around that time and one of them gave her a rash but she is not clear which 1.  . Codeine Sulfate   . Morphine And Related   . Penicillins   . Sulfate   . Clindamycin/Lincomycin Rash    Past Medical History:  Diagnosis Date  . B12 deficiency   . CHF (congestive heart failure) ([place])   . CKD (chronic kidney disease), stage III   . Depression   . Endometriosis   . Hypertension   . Mixed hyperlipidemia   . Tobacco abuse     Past Surgical History:  Procedure Laterality Date  . ABDOMINAL HYSTERECTOMY    . BREAST BIOPSY Right 2015   neg-  FIBROADENOMA  . TAH/RSO  1999   secondary to bleeding and endometriosis (Dr Vernie Ammons)    Social History:  reports that she has been smoking cigarettes. She has a 40.00 pack-year smoking history. She has never used smokeless tobacco. She reports previous alcohol use. She reports that she does not use drugs.  Family History:  Family History  Problem Relation Age of Onset  . Hypercholesterolemia Mother   . Breast cancer Neg Hx   . Colon cancer Neg Hx      Prior to Admission medications   Medication Sig Start Date End Date Taking? Authorizing Provider  buPROPion (WELLBUTRIN XL) 150 MG 24 hr tablet TAKE 1 TABLET BY MOUTH  EVERY DAY 06/16/20   [name], MD  acetaminophen (TYLENOL) 325 MG tablet Take 650 mg by mouth every 6 (six) hours as needed.    [provider]  apixaban (ELIQUIS) 2.5 MG TABS tablet Take 1 tablet (2.5 mg total) by mouth 2 (two) times daily. 02/21/20   Loel Dubonnet, NP  atorvastatin (LIPITOR) 40 MG tablet Take 1 tablet (40 mg total) by mouth daily. 02/14/20   Rai, Vernelle Emerald, MD  carvedilol (COREG) 12.5 MG tablet Take 1.5 tablets (18.75 mg total) by mouth 2 (two) times daily with a meal. 05/03/20   Gollan, Kathlene November, MD  furosemide (LASIX) 20 MG tablet  Take 1 tablet (20 mg total) by mouth daily. 02/13/20 02/12/21  Rai, Vernelle Emerald, MD  loratadine (CLARITIN) 10 MG tablet Take 10 mg by mouth daily.    [provider]  omeprazole (PRILOSEC) 20 MG capsule Take 20 mg by mouth daily.    [provider]  sacubitril-valsartan (ENTRESTO) 24-26 MG Take 1 tablet by mouth 2 (two) times daily. 02/13/20   Ahmed Prima, Fransisco Hertz, PA-C  venlafaxine XR (EFFEXOR-XR) 150 MG 24 hr capsule TAKE 1 CAPSULE BY MOUTH EVERY DAY 06/05/20   [name], MD    Physical Exam: Vitals:   06/16/20 1430 06/16/20 1515 06/16/20 1525 06/16/20 1552  BP: (!) 128/59  113/78 (!) 121/44  Pulse:  65 72 84  Resp: 16 17 18 16   Temp:   98 F (36.7 C) 98.3 F (36.8 C)  TempSrc:   Oral Oral  SpO2:  98% 98% 100%  Weight:      Height:       General: Not in acute distress HEENT:       Eyes: PERRL, EOMI, no scleral icterus.       ENT: No discharge from the ears and nose, no pharynx injection, no tonsillar enlargement.        Neck: No JVD, no bruit, no mass felt. Heme: No neck lymph node enlargement. Cardiac: S1/S2, RRR, No murmurs, No gallops or rubs. Respiratory: Good air movement bilaterally. No rales, wheezing, rhonchi or rubs. GI: Soft, nondistended, nontender, no rebound pain, no organomegaly, BS present. GU: No hematuria Ext: No pitting leg edema bilaterally. 2+DP/PT pulse bilaterally. Musculoskeletal: No joint deformities, No joint redness or warmth, no limitation of ROM in spin. Skin: No rashes.  Neuro: Alert, oriented X3, cranial nerves II-XII grossly intact, moves all extremities normally. Muscle strength 5/5 in all extremities, sensation to light touch intact. Brachial reflex 2+ bilaterally. Knee reflex 1+ bilaterally. Negative Babinski's sign. Normal finger to nose test. Psych: Patient is not psychotic, no suicidal or hemocidal ideation.  Labs on Admission: I have personally reviewed following labs and imaging studies  CBC: Recent Labs  Lab  06/15/20 2006 06/16/20 0956  WBC 18.9* 21.8*  NEUTROABS  --  18.2*  HGB 14.8 13.9  HCT 44.7 40.7  MCV 81.3 78.9*  PLT 171 950*   Basic Metabolic Panel: Recent Labs  Lab 06/15/20 2006 06/16/20 1117  NA 134* 129*  K 3.3* 4.3  CL 104 101  CO2 16* 13*  GLUCOSE 60* 45*  BUN 46* 54*  CREATININE 2.49* 2.53*  CALCIUM 9.0 8.3*  MG  --  2.4   GFR: Estimated Creatinine Clearance: 18.1 mL/min (A) (by C-G formula based on SCr of 2.53 mg/dL (H)). Liver Function Tests: Recent Labs  Lab 06/15/20 2006  AST 16  ALT 12  ALKPHOS 85  BILITOT 0.8  PROT 7.5  ALBUMIN  4.2   Recent Labs  Lab 06/15/20 2313  LIPASE 19   No results for input(s): AMMONIA in the last 168 hours. Coagulation Profile: No results for input(s): INR, PROTIME in the last 168 hours. Cardiac Enzymes: No results for input(s): CKTOTAL, CKMB, CKMBINDEX, TROPONINI in the last 168 hours. BNP (last 3 results) No results for input(s): PROBNP in the last 8760 hours. HbA1C: No results for input(s): HGBA1C in the last 72 hours. CBG: Recent Labs  Lab 06/16/20 1218 06/16/20 1239 06/16/20 1307 06/16/20 1413 06/16/20 1513  GLUCAP 39* 35* 101* 145* 183*   Lipid Profile: No results for input(s): CHOL, HDL, LDLCALC, TRIG, CHOLHDL, LDLDIRECT in the last 72 hours. Thyroid Function Tests: No results for input(s): TSH, T4TOTAL, FREET4, T3FREE, THYROIDAB in the last 72 hours. Anemia Panel: No results for input(s): VITAMINB12, FOLATE, FERRITIN, TIBC, IRON, RETICCTPCT in the last 72 hours. Urine analysis:    Component Value Date/Time   COLORURINE STRAW (A) 06/15/2020 1117   APPEARANCEUR CLEAR (A) 06/15/2020 1117   LABSPEC 1.004 (L) 06/15/2020 1117   PHURINE 6.0 06/15/2020 1117   GLUCOSEU NEGATIVE 06/15/2020 1117   GLUCOSEU NEGATIVE 01/22/2017 1052   HGBUR NEGATIVE 06/15/2020 1117   BILIRUBINUR NEGATIVE 06/15/2020 1117   KETONESUR 5 (A) 06/15/2020 1117   PROTEINUR 30 (A) 06/15/2020 1117   UROBILINOGEN 0.2 01/22/2017  1052   NITRITE NEGATIVE 06/15/2020 1117   LEUKOCYTESUR NEGATIVE 06/15/2020 1117   Sepsis Labs: @LABRCNTIP (procalcitonin:4,lacticidven:4) ) Recent Results (from the past 240 hour(s))  Respiratory Panel by RT PCR (Flu A&B, Covid) - Nasopharyngeal Swab     Status: None   Collection Time: 06/16/20  9:56 AM   Specimen: Nasopharyngeal Swab  Result Value Ref Range Status   SARS Coronavirus 2 by RT PCR NEGATIVE NEGATIVE Final    Comment: (NOTE) SARS-CoV-2 target nucleic acids are NOT DETECTED.  The SARS-CoV-2 RNA is generally detectable in upper respiratoy specimens during the acute phase of infection. The lowest concentration of SARS-CoV-2 viral copies this assay can detect is 131 copies/mL. A negative result does not preclude SARS-Cov-2 infection and should not be used as the sole basis for treatment or other patient management decisions. A negative result may occur with  improper specimen collection/handling, submission of specimen other than nasopharyngeal swab, presence of viral mutation(s) within the areas targeted by this assay, and inadequate number of viral copies (<131 copies/mL). A negative result must be combined with clinical observations, patient history, and epidemiological information. The expected result is Negative.  Fact Sheet for Patients:  PinkCheek.be  Fact Sheet for Healthcare Providers:  GravelBags.it  This test is no t yet approved or cleared by the [place] FDA and  has been authorized for detection and/or diagnosis of SARS-CoV-2 by FDA under an Emergency Use Authorization (EUA). This EUA will remain  in effect (meaning this test can be used) for the duration of the COVID-19 declaration under Section 564(b)(1) of the Act, 21 U.S.C. section 360bbb-3(b)(1), unless the authorization is terminated or revoked sooner.     Influenza A by PCR NEGATIVE NEGATIVE Final   Influenza B by PCR NEGATIVE  NEGATIVE Final    Comment: (NOTE) The Xpert Xpress SARS-CoV-2/FLU/RSV assay is intended as an aid in  the diagnosis of influenza from Nasopharyngeal swab specimens and  should not be used as a sole basis for treatment. Nasal washings and  aspirates are unacceptable for Xpert Xpress SARS-CoV-2/FLU/RSV  testing.  Fact Sheet for Patients: PinkCheek.be  Fact Sheet for Healthcare Providers: GravelBags.it  This  test is not yet approved or cleared by the [place] and  has been authorized for detection and/or diagnosis of SARS-CoV-2 by  FDA under an Emergency Use Authorization (EUA). This EUA will remain  in effect (meaning this test can be used) for the duration of the  Covid-19 declaration under Section 564(b)(1) of the Act, 21  U.S.C. section 360bbb-3(b)(1), unless the authorization is  terminated or revoked. Performed at [facility], [address] [place], [place] [address]      Radiological Exams on Admission: DG Chest 2 View  Result Date: 06/15/2020 CLINICAL DATA:  Dizziness EXAM: CHEST - 2 VIEW COMPARISON:  02/09/2020 FINDINGS: There is hyperinflation of the lungs compatible with COPD. Heart and mediastinal contours are within normal limits. No focal opacities or effusions. No acute bony abnormality. Scoliosis. IMPRESSION: COPD.  No active disease. Electronically Signed   By: Rolm Baptise M.D.   On: 06/15/2020 20:32     EKG: I have personally reviewed.  Sinus rhythm, QTC 397, first-degree AV block, T wave inversion in inferior leads and V3-v6.  Assessment/Plan Principal Problem:   Nausea vomiting and diarrhea Active Problems:   Hypertension   Mild depression (HCC)   Tobacco abuse   Acute renal failure superimposed on stage 3b chronic kidney disease (HCC)   Chronic combined systolic and diastolic CHF (congestive heart failure) (HCC)   Leukocytosis   HLD (hyperlipidemia)   Elevated troponin    GERD (gastroesophageal reflux disease)   Hypoglycemia   Nausea vomiting and diarrhea: Etiology is not clear, will need to rule out C diff colitis.  Possibly due to viral gastroenteritis.  -Placed on MedSurg [place] for patient -Follow-up C. difficile PCR and GI pathogen panel -IV fluid: 1L LR and then 50 cc/h of normal saline -As needed Zofran  Hypertension -Hold Entresto and Lasix due to worsening renal function -IV hydralazine as needed -Continue amlodipine, Coreg  Mild depression (HCC) -Continue home medications  Tobacco abuse -Nicotine patch  Acute renal failure superimposed on stage 3b chronic kidney disease ([place]): Baseline creatinine 1.59 on 03/28/2020.  Her creatinine is 2.49, BUN 46, likely due to prerenal failure secondary to dehydration -Hold Lasix and Entresto -IV fluid as above -Follow-up with BMP  Chronic combined systolic and diastolic CHF (congestive heart failure) ([place]): 2D echo on 02/10/2020 showed EF of 30-35% with grade 2 diastolic dysfunction.  Patient does not have leg edema or JVD.  BNP 115.  CHF is compensated. -Hold Lasix and Entresto due to worsening renal function  Leukocytosis: WBC 21.8.  Patient seems to have chronic intermittent leukocytosis.  Her WBC was 18.9 on 06/15/2020.  No source for infection identified. --Follow-up of blood culture -follow-up with CBC  HLD (hyperlipidemia) -Lipitor  Elevated troponin: Troponin is mildly elevated at 21, 23, 26, 29, no chest pain.  Likely due to nonspecific elevation in the setting of worsening renal function -Continue Lipitor, Coreg -Check A1c, FLP  GERD (gastroesophageal reflux disease) -Protonix  Hypoglycemia: Blood sugar 60, likely due to poor oral intake -Check CBG every hour for 3 times -As needed D50      DVT ppx: Eliquis Code Status: Full code Family Communication: not done, no family member is at bed side.  Disposition Plan:  Anticipate discharge back to previous environment Consults  called:  None Admission status: Med-surg bed for obs    Status is: Observation  The patient remains OBS appropriate and will d/c before 2 midnights.  Dispo: The patient is from: Home  Anticipated d/c is to: Home              Anticipated d/c date is: 1 day              Patient currently is not medically stable to d/c.         Date of Service 06/16/2020    Ivor Costa [facility]   If 7PM-7AM, please contact night-coverage [email or web address] 06/16/2020, 5:05 PM

## 2020-06-16 NOTE — ED Notes (Signed)
Md made aware of bradycardia and new EKG completed.

## 2020-06-16 NOTE — ED Notes (Signed)
Pt CBG 39, pt alert and oriented.  Orange juice given, will recheck CBG

## 2020-06-17 DIAGNOSIS — Z79899 Other long term (current) drug therapy: Secondary | ICD-10-CM | POA: Diagnosis not present

## 2020-06-17 DIAGNOSIS — I493 Ventricular premature depolarization: Secondary | ICD-10-CM | POA: Diagnosis present

## 2020-06-17 DIAGNOSIS — E876 Hypokalemia: Secondary | ICD-10-CM | POA: Diagnosis present

## 2020-06-17 DIAGNOSIS — Z882 Allergy status to sulfonamides status: Secondary | ICD-10-CM | POA: Diagnosis not present

## 2020-06-17 DIAGNOSIS — Z880 Allergy status to penicillin: Secondary | ICD-10-CM | POA: Diagnosis not present

## 2020-06-17 DIAGNOSIS — A072 Cryptosporidiosis: Secondary | ICD-10-CM

## 2020-06-17 DIAGNOSIS — Z83438 Family history of other disorder of lipoprotein metabolism and other lipidemia: Secondary | ICD-10-CM | POA: Diagnosis not present

## 2020-06-17 DIAGNOSIS — Z7901 Long term (current) use of anticoagulants: Secondary | ICD-10-CM | POA: Diagnosis not present

## 2020-06-17 DIAGNOSIS — N179 Acute kidney failure, unspecified: Secondary | ICD-10-CM | POA: Diagnosis not present

## 2020-06-17 DIAGNOSIS — I130 Hypertensive heart and chronic kidney disease with heart failure and stage 1 through stage 4 chronic kidney disease, or unspecified chronic kidney disease: Secondary | ICD-10-CM | POA: Diagnosis present

## 2020-06-17 DIAGNOSIS — I248 Other forms of acute ischemic heart disease: Secondary | ICD-10-CM | POA: Diagnosis present

## 2020-06-17 DIAGNOSIS — Z885 Allergy status to narcotic agent status: Secondary | ICD-10-CM | POA: Diagnosis not present

## 2020-06-17 DIAGNOSIS — F32A Depression, unspecified: Secondary | ICD-10-CM | POA: Diagnosis present

## 2020-06-17 DIAGNOSIS — K219 Gastro-esophageal reflux disease without esophagitis: Secondary | ICD-10-CM | POA: Diagnosis present

## 2020-06-17 DIAGNOSIS — R112 Nausea with vomiting, unspecified: Secondary | ICD-10-CM | POA: Diagnosis present

## 2020-06-17 DIAGNOSIS — E872 Acidosis: Secondary | ICD-10-CM | POA: Diagnosis present

## 2020-06-17 DIAGNOSIS — E860 Dehydration: Secondary | ICD-10-CM | POA: Diagnosis not present

## 2020-06-17 DIAGNOSIS — I480 Paroxysmal atrial fibrillation: Secondary | ICD-10-CM | POA: Diagnosis present

## 2020-06-17 DIAGNOSIS — R197 Diarrhea, unspecified: Secondary | ICD-10-CM | POA: Diagnosis not present

## 2020-06-17 DIAGNOSIS — E162 Hypoglycemia, unspecified: Secondary | ICD-10-CM | POA: Diagnosis present

## 2020-06-17 DIAGNOSIS — Z20822 Contact with and (suspected) exposure to covid-19: Secondary | ICD-10-CM | POA: Diagnosis present

## 2020-06-17 DIAGNOSIS — F1011 Alcohol abuse, in remission: Secondary | ICD-10-CM | POA: Diagnosis present

## 2020-06-17 DIAGNOSIS — N1832 Chronic kidney disease, stage 3b: Secondary | ICD-10-CM | POA: Diagnosis present

## 2020-06-17 DIAGNOSIS — E782 Mixed hyperlipidemia: Secondary | ICD-10-CM | POA: Diagnosis present

## 2020-06-17 DIAGNOSIS — F17210 Nicotine dependence, cigarettes, uncomplicated: Secondary | ICD-10-CM | POA: Diagnosis present

## 2020-06-17 DIAGNOSIS — I5042 Chronic combined systolic (congestive) and diastolic (congestive) heart failure: Secondary | ICD-10-CM | POA: Diagnosis present

## 2020-06-17 LAB — BASIC METABOLIC PANEL
Anion gap: 9 (ref 5–15)
Anion gap: 7 (ref 5–15)
BUN: 57 mg/dL — ABNORMAL HIGH (ref 8–23)
BUN: 50 mg/dL — ABNORMAL HIGH (ref 8–23)
CO2: 17 mmol/L — ABNORMAL LOW (ref 22–32)
CO2: 20 mmol/L — ABNORMAL LOW (ref 22–32)
Calcium: 8.2 mg/dL — ABNORMAL LOW (ref 8.9–10.3)
Calcium: 7.7 mg/dL — ABNORMAL LOW (ref 8.9–10.3)
Chloride: 107 mmol/L (ref 98–111)
Chloride: 103 mmol/L (ref 98–111)
Creatinine, Ser: 2.1 mg/dL — ABNORMAL HIGH (ref 0.44–1.00)
Creatinine, Ser: 2 mg/dL — ABNORMAL HIGH (ref 0.44–1.00)
GFR, Estimated: 26 mL/min — ABNORMAL LOW (ref >60)
GFR, Estimated: 27 mL/min — ABNORMAL LOW (ref >60)
Glucose, Bld: 97 mg/dL (ref 70–99)
Glucose, Bld: 144 mg/dL — ABNORMAL HIGH (ref 70–99)
Potassium: 2.9 mmol/L — ABNORMAL LOW (ref 3.5–5.1)
Potassium: 3.4 mmol/L — ABNORMAL LOW (ref 3.5–5.1)
Sodium: 133 mmol/L — ABNORMAL LOW (ref 135–145)
Sodium: 130 mmol/L — ABNORMAL LOW (ref 135–145)

## 2020-06-17 LAB — LIPID PANEL
Cholesterol: 125 mg/dL (ref 0–200)
HDL: 63 mg/dL (ref >40)
LDL Cholesterol: 49 mg/dL (ref 0–99)
Total CHOL/HDL Ratio: 2 RATIO
Triglycerides: 65 mg/dL (ref <150)
VLDL: 13 mg/dL (ref 0–40)

## 2020-06-17 LAB — CBC
HCT: 36.8 % (ref 36.0–46.0)
Hemoglobin: 12.7 g/dL (ref 12.0–15.0)
MCH: 27.4 pg (ref 26.0–34.0)
MCHC: 34.5 g/dL (ref 30.0–36.0)
MCV: 79.3 fL — ABNORMAL LOW (ref 80.0–100.0)
Platelets: 104 10*3/uL — ABNORMAL LOW (ref 150–400)
RBC: 4.64 MIL/uL (ref 3.87–5.11)
RDW: 18.8 % — ABNORMAL HIGH (ref 11.5–15.5)
WBC: 14.7 10*3/uL — ABNORMAL HIGH (ref 4.0–10.5)
nRBC: 0 % (ref 0.0–0.2)

## 2020-06-17 LAB — TROPONIN I (HIGH SENSITIVITY)
Troponin I (High Sensitivity): 22 ng/L — ABNORMAL HIGH (ref <18)
Troponin I (High Sensitivity): 22 ng/L — ABNORMAL HIGH (ref <18)

## 2020-06-17 LAB — MAGNESIUM: Magnesium: 3 mg/dL — ABNORMAL HIGH (ref 1.7–2.4)

## 2020-06-17 MED ORDER — POTASSIUM CHLORIDE CRYS ER 20 MEQ PO TBCR
40.0000 meq | EXTENDED_RELEASE_TABLET | Freq: Once | ORAL | Status: AC
  Administered 2020-06-17: 40 meq via ORAL
  Filled 2020-06-17: qty 2

## 2020-06-17 MED ORDER — STERILE WATER FOR INJECTION IV SOLN
INTRAVENOUS | Status: DC
  Filled 2020-06-17: qty 150
  Filled 2020-06-17: qty 850
  Filled 2020-06-17 (×2): qty 150
  Filled 2020-06-17: qty 850

## 2020-06-17 MED ORDER — POTASSIUM CHLORIDE 10 MEQ/100ML IV SOLN
10.0000 meq | INTRAVENOUS | Status: AC
  Administered 2020-06-17 (×4): 10 meq via INTRAVENOUS
  Filled 2020-06-17 (×4): qty 100

## 2020-06-17 NOTE — Consult Note (Addendum)
PHARMACY CONSULT NOTE - FOLLOW UP  Pharmacy Consult for Electrolyte Monitoring and Replacement   Recent Labs: Potassium (mmol/L)  Date Value  06/17/2020 2.9 (L)   Magnesium (mg/dL)  Date Value  06/17/2020 3.0 (H)   Calcium (mg/dL)  Date Value  06/17/2020 8.2 (L)   Albumin (g/dL)  Date Value  06/15/2020 4.2   Sodium (mmol/L)  Date Value  06/17/2020 133 (L)  02/21/2020 132 (L)   Assessment: 64 year old female presenting with nausea, vomiting, lightheadedness, dizziness, and diarrhea. PMH includes HTN, HLD, GERD, depression, tobacco abuse, alcohol abuse in remission, CKD stage IIIb, CHF with EF 30-35%, and PAF on Eliquis. 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   Goal of Therapy:  - K 4 for afib - All other electrolytes WNL  Plan:  - K replaced with KCl 40 mEq PO x 1 dose and KCl 10 mEq IV x 4 doses per MD - Follow-up on K levels with BMP @ [place], PharmD Pharmacy Resident  06/17/2020 11:37 AM

## 2020-06-17 NOTE — Consult Note (Signed)
PHARMACY CONSULT NOTE - FOLLOW UP  Pharmacy Consult for Electrolyte Monitoring and Replacement   Recent Labs: Potassium (mmol/L)  Date Value  06/17/2020 3.4 (L)   Magnesium (mg/dL)  Date Value  06/17/2020 3.0 (H)   Calcium (mg/dL)  Date Value  06/17/2020 7.7 (L)   Albumin (g/dL)  Date Value  06/15/2020 4.2   Sodium (mmol/L)  Date Value  06/17/2020 130 (L)  02/21/2020 132 (L)   Assessment: 64 year old female presenting with nausea, vomiting, lightheadedness, dizziness, and diarrhea. PMH includes HTN, HLD, GERD, depression, tobacco abuse, alcohol abuse in remission, CKD stage IIIb, CHF with EF 30-35%, and PAF on Eliquis. 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 K 2.9 --> 3.4.   Goal of Therapy:  - K 4 for afib - All other electrolytes WNL  Plan:  - Replace with KCl 40 mEq PO x 1 dose - Follow-up levels with AM labs  Benn Moulder, PharmD Pharmacy Resident  06/17/2020 3:45 PM

## 2020-06-17 NOTE — Progress Notes (Signed)
Dawn Sherman NAME: Dawn Sherman    MR#:  [id number]  DATE OF BIRTH:  [birth year]  SUBJECTIVE:  patient came in with nausea vomiting and watery diarrhea for 3 to 4 days. Continues to have diarrhea while in house. She says she feels better after IV fluids. Able to tolerate diet. No vomiting. No fever abdominal pain  REVIEW OF SYSTEMS:   Review of Systems  Constitutional: Negative for chills, fever and weight loss.  HENT: Negative for ear discharge, ear pain and nosebleeds.   Eyes: Negative for blurred vision, pain and discharge.  Respiratory: Negative for sputum production, shortness of breath, wheezing and stridor.   Cardiovascular: Negative for chest pain, palpitations, orthopnea and PND.  Gastrointestinal: Positive for diarrhea, nausea and vomiting. Negative for abdominal pain.  Genitourinary: Negative for frequency and urgency.  Musculoskeletal: Negative for back pain and joint pain.  Neurological: Positive for weakness. Negative for sensory change, speech change and focal weakness.  Psychiatric/Behavioral: Negative for depression and hallucinations. The patient is not nervous/anxious.    Tolerating Diet:yes Tolerating PT: ambulatory  DRUG ALLERGIES:   Allergies  Allergen Reactions  . Amoxicillin   . Benicar [Olmesartan]     Talked with patient February 10, 2020, intolerance is unclear, tried several medications around that time and one of them gave her a rash but she is not clear which 1.  . Codeine Sulfate   . Morphine And Related   . Penicillins   . Sulfate   . Clindamycin/Lincomycin Rash    VITALS:  Blood pressure 131/60, pulse 77, temperature (!) 97.5 F (36.4 C), temperature source Oral, resp. rate 16, height 5\' 5"  (1.651 m), weight 53.8 kg, SpO2 100 %.  PHYSICAL EXAMINATION:   Physical Exam  GENERAL:  64 y.o.-year-old patient lying in the bed with no acute distress.  EYES: Pupils equal, round, reactive to light  and accommodation. No scleral icterus.   HEENT: Head atraumatic, normocephalic. Oropharynx and nasopharynx clear.  NECK:  Supple, no jugular venous distention. No thyroid enlargement, no tenderness.  LUNGS: Normal breath sounds bilaterally, no wheezing, rales, rhonchi. No use of accessory muscles of respiration.  CARDIOVASCULAR: S1, S2 normal. No murmurs, rubs, or gallops.  ABDOMEN: Soft, nontender, nondistended. Bowel sounds present. No organomegaly or mass.  EXTREMITIES: No cyanosis, clubbing or edema b/l.    NEUROLOGIC: Cranial nerves II through XII are intact. No focal Motor or sensory deficits b/l. Generalized weakness PSYCHIATRIC:  patient is alert and oriented x 3.  SKIN: No obvious rash, lesion, or ulcer.   LABORATORY PANEL:  CBC Recent Labs  Lab 06/17/20 0424  WBC 14.7*  HGB 12.7  HCT 36.8  PLT 104*    Chemistries  Recent Labs  Lab 06/15/20 2006 06/16/20 1117 06/17/20 0423 06/17/20 0424  NA 134*   < > 133*  --   K 3.3*   < > 2.9*  --   CL 104   < > 107  --   CO2 16*   < > 17*  --   GLUCOSE 60*   < > 97  --   BUN 46*   < > 57*  --   CREATININE 2.49*   < > 2.10*  --   CALCIUM 9.0   < > 8.2*  --   MG  --    < >  --  3.0*  AST 16  --   --   --   ALT 12  --   --   --  ALKPHOS 85  --   --   --   BILITOT 0.8  --   --   --    < > = values in this interval not displayed.   Cardiac Enzymes No results for input(s): TROPONINI in the last 168 hours. RADIOLOGY:  DG Chest 2 View  Result Date: 06/15/2020 CLINICAL DATA:  Dizziness EXAM: CHEST - 2 VIEW COMPARISON:  02/09/2020 FINDINGS: There is hyperinflation of the lungs compatible with COPD. Heart and mediastinal contours are within normal limits. No focal opacities or effusions. No acute bony abnormality. Scoliosis. IMPRESSION: COPD.  No active disease. Electronically Signed   By: Rolm Baptise M.D.   On: 06/15/2020 20:32   ASSESSMENT AND PLAN:  Dawn Sherman is a 64 y.o. female with medical history significant of  hypertension, hyperlipidemia, GERD, depression, tobacco abuse, alcohol abuse in remission, CKD stage IIIb, CHF with EF 30-35%, PAF on Eliquis, who presents with nausea vomiting and diarrhea. Patient states that she has been having nausea vomiting and diarrhea for more than 3 days.  She has had more than 5 times of watery diarrhea yday  Nausea vomiting and diarrhea secondary to Cryptosporidium  secretory diarrhea/acidosis  C diff  negative G.I. PCR positive for Cryptosporidium continue supportive care with IV fluids PRN Imodium -IV fluids with bicarb -As needed Zofran  Hypokalemia -due to G.I. loss -message to replete electrolyte  Hypertension -Hold Entresto and Lasix due to worsening renal function -IV hydralazine as needed -Continue amlodipine, Coreg  Mild depression (HCC) -Continue home medications  Tobacco abuse -Nicotine patch  Acute renal failure superimposed on stage 3b chronic kidney disease ([place]):  -Baseline creatinine 1.59 on 03/28/2020. -Came in with creatinine is 2.49--2.10 -likely due to prerenal failure secondary to dehydration -Hold Lasix and Entresto -IV fluid as above  Chronic combined systolic and diastolic CHF (congestive heart failure) ([place]): 2D echo on 02/10/2020 showed EF of 30-35% with grade 2 diastolic dysfunction.  Patient does not have leg edema or JVD.  BNP 115.  CHF is compensated. -Hold Lasix and Entresto due to worsening renal function  Leukocytosis: WBC 21.8.  Patient seems to have chronic intermittent leukocytosis.  Her WBC was 18.9 on 06/15/2020.  No bacterial source for infection identified.  HLD (hyperlipidemia) -Lipitor  Elevated troponin: Troponin is mildly elevated at 21, 23, 26, 29, no chest pain.  Likely due to nonspecific elevation in the setting of worsening renal function -Continue Lipitor, Coreg -no chest pain  GERD (gastroesophageal reflux disease) -Protonix  Hypoglycemia: Blood sugar 60, likely due to poor oral  intake -Check CBG every hour for 3 times -sugars much improved. Discontinue dextrose drip    DVT ppx: Eliquis Code Status: Full code Family Communication: not done, no family member is at bed side.  Disposition Plan:  Anticipate discharge back to previous environment Consults called:  None Admission status: Med-surg bed for inpatient     Status is: inpatient  patient came in with significant nausea vomiting diarrhea. Etiology Cryptosporidium. She is having acute secretory diarrhea which needs aggressive IV fluids and electrolyte replacement.  Dispo: The patient is from: Home  Anticipated d/c is to: Home  Anticipated d/c date is: 1 day  Patient currently is not medically stable to d/c.            TOTAL TIME TAKING CARE OF THIS PATIENT: *25* minutes.  >50% time spent on counselling and coordination of care  Note: This dictation was prepared with Dragon dictation along with smaller phrase technology. Any  transcriptional errors that result from this process are unintentional.  Fritzi Mandes M.D    [facility]   CC: Primary care physician; [name], MDPatient ID: Dawn Sherman, female   DOB: 25-Sep-1955, 64 y.o.   MRN: [id number]

## 2020-06-17 NOTE — Progress Notes (Addendum)
Cross Cover Note Nurse called report patient with asymptomatic bradycardia in 30's and patient was sleeping.  EKG done with patiient awake without change from prior EKG with 1st degree AVB.  Review of tele shows patient with second degree AVB type II.    Beta Blocker discontinued.   May need further cardiology input for evaluation  Review of labs, patient with bicarb deficit somewhat improved but evident with minimal improvement in renal function. Patient IVF changed to bicarb infusion and repeat BMP ordered for this afternoon.  Potassium critically low and replacement ordered

## 2020-06-18 DIAGNOSIS — R112 Nausea with vomiting, unspecified: Secondary | ICD-10-CM | POA: Diagnosis not present

## 2020-06-18 DIAGNOSIS — R197 Diarrhea, unspecified: Secondary | ICD-10-CM | POA: Diagnosis not present

## 2020-06-18 LAB — CBC
HCT: 32.9 % — ABNORMAL LOW (ref 36.0–46.0)
Hemoglobin: 11.3 g/dL — ABNORMAL LOW (ref 12.0–15.0)
MCH: 27.1 pg (ref 26.0–34.0)
MCHC: 34.3 g/dL (ref 30.0–36.0)
MCV: 78.9 fL — ABNORMAL LOW (ref 80.0–100.0)
Platelets: 80 10*3/uL — ABNORMAL LOW (ref 150–400)
RBC: 4.17 MIL/uL (ref 3.87–5.11)
RDW: 18.6 % — ABNORMAL HIGH (ref 11.5–15.5)
WBC: 12.4 10*3/uL — ABNORMAL HIGH (ref 4.0–10.5)
nRBC: 0 % (ref 0.0–0.2)

## 2020-06-18 LAB — BASIC METABOLIC PANEL
Anion gap: 8 (ref 5–15)
BUN: 36 mg/dL — ABNORMAL HIGH (ref 8–23)
CO2: 26 mmol/L (ref 22–32)
Calcium: 7.9 mg/dL — ABNORMAL LOW (ref 8.9–10.3)
Chloride: 101 mmol/L (ref 98–111)
Creatinine, Ser: 1.45 mg/dL — ABNORMAL HIGH (ref 0.44–1.00)
GFR, Estimated: 40 mL/min — ABNORMAL LOW (ref >60)
Glucose, Bld: 116 mg/dL — ABNORMAL HIGH (ref 70–99)
Potassium: 3.7 mmol/L (ref 3.5–5.1)
Sodium: 135 mmol/L (ref 135–145)

## 2020-06-18 LAB — POTASSIUM: Potassium: 3.1 mmol/L — ABNORMAL LOW (ref 3.5–5.1)

## 2020-06-18 MED ORDER — POTASSIUM CHLORIDE CRYS ER 20 MEQ PO TBCR: 40.0000 meq | EXTENDED_RELEASE_TABLET | Freq: Once | ORAL | Status: DC

## 2020-06-18 MED ORDER — POTASSIUM CHLORIDE CRYS ER 20 MEQ PO TBCR
40.0000 meq | EXTENDED_RELEASE_TABLET | ORAL | Status: AC
  Administered 2020-06-18 (×2): 40 meq via ORAL
  Filled 2020-06-18 (×2): qty 2

## 2020-06-18 MED ORDER — ZOLPIDEM TARTRATE 5 MG PO TABS
5.0000 mg | ORAL_TABLET | Freq: Every evening | ORAL | Status: DC | PRN
  Administered 2020-06-18: 5 mg via ORAL
  Filled 2020-06-18: qty 1

## 2020-06-18 MED ORDER — BACID PO TABS
2.0000 | ORAL_TABLET | Freq: Three times a day (TID) | ORAL | Status: DC
  Filled 2020-06-18: qty 2

## 2020-06-18 MED ORDER — RISAQUAD PO CAPS
2.0000 | ORAL_CAPSULE | Freq: Three times a day (TID) | ORAL | Status: DC
  Administered 2020-06-18 – 2020-06-19 (×4): 2 via ORAL
  Filled 2020-06-18 (×4): qty 2

## 2020-06-18 NOTE — Consult Note (Signed)
PHARMACY CONSULT NOTE - FOLLOW UP  Pharmacy Consult for Electrolyte Monitoring and Replacement   Recent Labs: Potassium (mmol/L)  Date Value  06/18/2020 3.1 (L)   Magnesium (mg/dL)  Date Value  06/17/2020 3.0 (H)   Calcium (mg/dL)  Date Value  06/17/2020 7.7 (L)   Albumin (g/dL)  Date Value  06/15/2020 4.2   Sodium (mmol/L)  Date Value  06/17/2020 130 (L)  02/21/2020 132 (L)   Assessment: 64 year old female presenting with nausea, vomiting, lightheadedness, dizziness, and diarrhea. PMH includes HTN, HLD, GERD, depression, tobacco abuse, alcohol abuse in remission, CKD stage IIIb, CHF with EF 30-35%, and PAF on Eliquis. 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 K 2.9 --> 3.4.   Pt is also on sodium bicarbonate @ 75 mL/hr for metabolic acidosis, which can decrease potassium.   Goal of Therapy:  - K 4 for afib - All other electrolytes WNL  Plan:  - Replace with KCl 40 mEq PO x 2 dose - Follow-up levels with AM labs  Oswald Hillock, PharmD 06/18/2020 9:02 AM

## 2020-06-18 NOTE — Progress Notes (Signed)
PROGRESS NOTE    Dawn Sherman  JOA:[id number] DOB: [birth year] DOA: 06/16/2020 PCP: [name], MD    Brief Narrative:  patient came in with nausea vomiting and watery diarrhea for 3 to 4 days.  Found with Cryptosporidium infection.  C. difficile negative.       Consultants:     Procedures:   Antimicrobials:       Subjective: Had 2 watery diarrhea already this am. No n/v. No abd pain.    Objective: Vitals:   06/17/20 1927 06/17/20 2330 06/18/20 0344 06/18/20 0744  BP: (!) 127/54 131/70 136/61 134/61  Pulse: 81 80 75 76  Resp: 20 20 20    Temp: 97.8 F (36.6 C) 98.3 F (36.8 C) 98.8 F (37.1 C) 98 F (36.7 C)  TempSrc: Oral Oral Oral Oral  SpO2: 100% 98% 100% 100%  Weight:   55 kg   Height:        Intake/Output Summary (Last 24 hours) at 06/18/2020 0900 Last data filed at 06/18/2020 0504 Gross per 24 hour  Intake 1968.65 ml  Output --  Net 1968.65 ml   Filed Weights   06/15/20 1958 06/17/20 0443 06/18/20 0344  Weight: 51.7 kg 53.8 kg 55 kg    Examination:  General exam: Appears calm and comfortable  Respiratory system: Clear to auscultation. Respiratory effort normal. Cardiovascular system: S1 & S2 heard, RRR. No JVD, murmurs, rubs, gallops or clicks. No pedal edema. Gastrointestinal system: Abdomen is nondistended, soft and nontender Normal bowel sounds heard. Central nervous system: Alert and oriented.  Grossly intact Extremities: No edema Skin: Warm dry Psychiatry: Judgement and insight appear normal. Mood & affect appropriate.     Data Reviewed: I have personally reviewed following labs and imaging studies  CBC: Recent Labs  Lab 06/15/20 2006 06/16/20 0956 06/17/20 0424 06/18/20 0426  WBC 18.9* 21.8* 14.7* 12.4*  NEUTROABS  --  18.2*  --   --   HGB 14.8 13.9 12.7 11.3*  HCT 44.7 40.7 36.8 32.9*  MCV 81.3 78.9* 79.3* 78.9*  PLT 171 128* 104* 80*   Basic Metabolic Panel: Recent Labs  Lab 06/15/20 2006 06/16/20 1117  06/17/20 0423 06/17/20 0424 06/17/20 1521 06/18/20 0426  NA 134* 129* 133*  --  130*  --   K 3.3* 4.3 2.9*  --  3.4* 3.1*  CL 104 101 107  --  103  --   CO2 16* 13* 17*  --  20*  --   GLUCOSE 60* 45* 97  --  144*  --   BUN 46* 54* 57*  --  50*  --   CREATININE 2.49* 2.53* 2.10*  --  2.00*  --   CALCIUM 9.0 8.3* 8.2*  --  7.7*  --   MG  --  2.4  --  3.0*  --   --    GFR: Estimated Creatinine Clearance: 24.3 mL/min (A) (by C-G formula based on SCr of 2 mg/dL (H)). Liver Function Tests: Recent Labs  Lab 06/15/20 2006  AST 16  ALT 12  ALKPHOS 85  BILITOT 0.8  PROT 7.5  ALBUMIN 4.2   Recent Labs  Lab 06/15/20 2313  LIPASE 19   No results for input(s): AMMONIA in the last 168 hours. Coagulation Profile: No results for input(s): INR, PROTIME in the last 168 hours. Cardiac Enzymes: No results for input(s): CKTOTAL, CKMB, CKMBINDEX, TROPONINI in the last 168 hours. BNP (last 3 results) No results for input(s): PROBNP in the last 8760 hours. HbA1C: No  results for input(s): HGBA1C in the last 72 hours. CBG: Recent Labs  Lab 06/16/20 1218 06/16/20 1239 06/16/20 1307 06/16/20 1413 06/16/20 1513  GLUCAP 39* 35* 101* 145* 183*   Lipid Profile: Recent Labs    06/17/20 0424  CHOL 125  HDL 63  LDLCALC 49  TRIG 65  CHOLHDL 2.0   Thyroid Function Tests: No results for input(s): TSH, T4TOTAL, FREET4, T3FREE, THYROIDAB in the last 72 hours. Anemia Panel: No results for input(s): VITAMINB12, FOLATE, FERRITIN, TIBC, IRON, RETICCTPCT in the last 72 hours. Sepsis Labs: No results for input(s): PROCALCITON, LATICACIDVEN in the last 168 hours.  Recent Results (from the past 240 hour(s))  Respiratory Panel by RT PCR (Flu A&B, Covid) - Nasopharyngeal Swab     Status: None   Collection Time: 06/16/20  9:56 AM   Specimen: Nasopharyngeal Swab  Result Value Ref Range Status   SARS Coronavirus 2 by RT PCR NEGATIVE NEGATIVE Final    Comment: (NOTE) SARS-CoV-2 target nucleic  acids are NOT DETECTED.  The SARS-CoV-2 RNA is generally detectable in upper respiratoy specimens during the acute phase of infection. The lowest concentration of SARS-CoV-2 viral copies this assay can detect is 131 copies/mL. A negative result does not preclude SARS-Cov-2 infection and should not be used as the sole basis for treatment or other patient management decisions. A negative result may occur with  improper specimen collection/handling, submission of specimen other than nasopharyngeal swab, presence of viral mutation(s) within the areas targeted by this assay, and inadequate number of viral copies (<131 copies/mL). A negative result must be combined with clinical observations, patient history, and epidemiological information. The expected result is Negative.  Fact Sheet for Patients:  PinkCheek.be  Fact Sheet for Healthcare Providers:  GravelBags.it  This test is no t yet approved or cleared by the [place] FDA and  has been authorized for detection and/or diagnosis of SARS-CoV-2 by FDA under an Emergency Use Authorization (EUA). This EUA will remain  in effect (meaning this test can be used) for the duration of the COVID-19 declaration under Section 564(b)(1) of the Act, 21 U.S.C. section 360bbb-3(b)(1), unless the authorization is terminated or revoked sooner.     Influenza A by PCR NEGATIVE NEGATIVE Final   Influenza B by PCR NEGATIVE NEGATIVE Final    Comment: (NOTE) The Xpert Xpress SARS-CoV-2/FLU/RSV assay is intended as an aid in  the diagnosis of influenza from Nasopharyngeal swab specimens and  should not be used as a sole basis for treatment. Nasal washings and  aspirates are unacceptable for Xpert Xpress SARS-CoV-2/FLU/RSV  testing.  Fact Sheet for Patients: PinkCheek.be  Fact Sheet for Healthcare Providers: GravelBags.it  This test  is not yet approved or cleared by the [place] FDA and  has been authorized for detection and/or diagnosis of SARS-CoV-2 by  FDA under an Emergency Use Authorization (EUA). This EUA will remain  in effect (meaning this test can be used) for the duration of the  Covid-19 declaration under Section 564(b)(1) of the Act, 21  U.S.C. section 360bbb-3(b)(1), unless the authorization is  terminated or revoked. Performed at [facility], [address] [place], [place] [address]   CULTURE, BLOOD (ROUTINE X 2) w Reflex to ID Panel     Status: None (Preliminary result)   Collection Time: 06/16/20 12:26 PM   Specimen: BLOOD  Result Value Ref Range Status   Specimen Description BLOOD BLOOD LEFT HAND  Final   Special Requests   Final    BOTTLES  DRAWN AEROBIC AND ANAEROBIC Blood Culture results may not be optimal due to an inadequate volume of blood received in culture bottles   Culture   Final    NO GROWTH 2 DAYS Performed at [facility], [address] [place], [place] [address]    Report Status PENDING  Incomplete  CULTURE, BLOOD (ROUTINE X 2) w Reflex to ID Panel     Status: None (Preliminary result)   Collection Time: 06/16/20 12:37 PM   Specimen: BLOOD  Result Value Ref Range Status   Specimen Description BLOOD LEFT ANTECUBITAL  Final   Special Requests   Final    BOTTLES DRAWN AEROBIC AND ANAEROBIC Blood Culture adequate volume   Culture   Final    NO GROWTH 2 DAYS Performed at [facility], [address], [place], [place] [address]    Report Status PENDING  Incomplete  C Difficile Quick Screen w PCR reflex     Status: None   Collection Time: 06/16/20  8:05 PM   Specimen: STOOL  Result Value Ref Range Status   C Diff antigen NEGATIVE NEGATIVE Final   C Diff toxin NEGATIVE NEGATIVE Final   C Diff interpretation No C. difficile detected.  Final    Comment: Performed at [facility], [address] [place], [place] [address]    Gastrointestinal Panel by PCR , Stool     Status: Abnormal   Collection Time: 06/16/20  8:05 PM   Specimen: STOOL  Result Value Ref Range Status   Campylobacter species NOT DETECTED NOT DETECTED Final   Plesimonas shigelloides NOT DETECTED NOT DETECTED Final   Salmonella species NOT DETECTED NOT DETECTED Final   Yersinia enterocolitica NOT DETECTED NOT DETECTED Final   Vibrio species NOT DETECTED NOT DETECTED Final   Vibrio cholerae NOT DETECTED NOT DETECTED Final   Enteroaggregative E coli (EAEC) NOT DETECTED NOT DETECTED Final   Enteropathogenic E coli (EPEC) NOT DETECTED NOT DETECTED Final   Enterotoxigenic E coli (ETEC) NOT DETECTED NOT DETECTED Final   Shiga like toxin producing E coli (STEC) NOT DETECTED NOT DETECTED Final   Shigella/Enteroinvasive E coli (EIEC) NOT DETECTED NOT DETECTED Final   Cryptosporidium DETECTED (A) NOT DETECTED Final    Comment: RESULT CALLED TO, READ BACK BY AND VERIFIED WITH: SONGSTER, DAWN AT 2213 ON 06/16/20 BY SS    Cyclospora cayetanensis NOT DETECTED NOT DETECTED Final   Entamoeba histolytica NOT DETECTED NOT DETECTED Final   Giardia lamblia NOT DETECTED NOT DETECTED Final   Adenovirus F40/41 NOT DETECTED NOT DETECTED Final   Astrovirus NOT DETECTED NOT DETECTED Final   Norovirus GI/GII NOT DETECTED NOT DETECTED Final   Rotavirus A NOT DETECTED NOT DETECTED Final   Sapovirus (I, II, IV, and V) NOT DETECTED NOT DETECTED Final    Comment: Performed at [facility], [address] [place], [place] [address]         Radiology Studies: No results found.      Scheduled Meds: . amLODipine  5 mg Oral Daily  . apixaban  2.5 mg Oral BID  . atorvastatin  40 mg Oral Daily  . buPROPion  150 mg Oral Daily  . lactobacillus acidophilus  2 tablet Oral TID  . loratadine  10 mg Oral Daily  . nicotine  21 mg Transdermal Daily  . pantoprazole  40 mg Oral Daily  . potassium chloride  40 mEq Oral Once  . venlafaxine XR  150 mg Oral Daily    Continuous Infusions: .  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 Patient states that she has been having nausea vomiting and diarrhea for more than 3 days.   Nausea vomiting and diarrhea secondary to Cryptosporidium  secretory diarrhea/acidosis C. difficile negative  GI panel positive for Cryptosporidium  Continuing with diarrhea but is slowly slowing down   will continue Imodium and IV fluids  Labs ordered this a.m. and pending  Add lactobacillus    Hypokalemia -Secondary to diarrhea  Replaced this a.m.  Monitor closely     Hypertension -Entresto/lasix held due to worsening renal function  Continue amlodipine  Coreg was held . Likely can resume once diastolic bp improves. IV hydralazine as needed   PAF- on a/c and beta blk Will hold ac due to platelets dropping Beta blk on hold    Mild depression (HCC) -Continue bupropion and Effexor   Tobacco abuse On nicotine patch  Acute renal failure superimposed on stage 3b chronic kidney disease ([place]): -Baseline creatinine 1.59 on 03/28/2020. -Likely due to dehydration/diarrhea  Improving with IV fluids Continue gentle  hydration, will decrease IV fluids to 50 MLS per hour to avoid volume overload Monitor labs Hold nephrotoxic medications     Chronic combined systolic and diastolic CHF (congestive heart failure) ([place]): 2D echo on 02/10/2020 showed EF of 30-35% with grade 2 diastolic dysfunction. Patient does not have leg edema or JVD. BNP 115. CHF is compensated. -Monitor volume status as patient is receiving IV fluids for hydration  Entresto and Lasix on hold due to worsening renal function     Leukocytosis: WBC 21.8. Patient seems to have chronic intermittent leukocytosis. Her WBC was 18.9 on 06/15/2020.  Improving with today WBC 12.4  HLD (hyperlipidemia) Lipitor  Elevated troponin: Troponin mildly elevated on admission trending downward.  Likely due to demand ischemia/ARI and volume loss Asymptomatic On Lipitor and Coreg(held)    GERD (gastroesophageal reflux disease) On Protonix  Hypoglycemia: Hypoglycemic on admission in the 30s due to poor intake -Glucose improving Was on dextrose drip which was discontinued   DVT prophylaxis: Eliquis  code Status: Full Family Communication: None at bedside  Status is: Inpatient  Remains inpatient appropriate because:IV treatments appropriate due to intensity of illness or inability to take PO   Dispo: The patient is from: Home              Anticipated d/c is to: Home              Anticipated d/c date is: 1-2 days              Patient currently is not medically stable to d/c.  Once diarrhea improves and able to DC fluid            LOS: 1  day   Time spent: 35 minutes with more than 50% on [place], MD [facility] Pager [phone]  If 7PM-7AM, please contact night-coverage [email or web address] Password [email or web address] 06/18/2020, 9:00 AM

## 2020-06-19 ENCOUNTER — Telehealth: Payer: Self-pay

## 2020-06-19 DIAGNOSIS — R197 Diarrhea, unspecified: Secondary | ICD-10-CM | POA: Diagnosis not present

## 2020-06-19 DIAGNOSIS — R112 Nausea with vomiting, unspecified: Secondary | ICD-10-CM | POA: Diagnosis not present

## 2020-06-19 DIAGNOSIS — A072 Cryptosporidiosis: Secondary | ICD-10-CM | POA: Diagnosis not present

## 2020-06-19 DIAGNOSIS — N179 Acute kidney failure, unspecified: Secondary | ICD-10-CM | POA: Diagnosis not present

## 2020-06-19 LAB — BASIC METABOLIC PANEL
Anion gap: 11 (ref 5–15)
BUN: 29 mg/dL — ABNORMAL HIGH (ref 8–23)
CO2: 31 mmol/L (ref 22–32)
Calcium: 7.9 mg/dL — ABNORMAL LOW (ref 8.9–10.3)
Chloride: 99 mmol/L (ref 98–111)
Creatinine, Ser: 1.52 mg/dL — ABNORMAL HIGH (ref 0.44–1.00)
GFR, Estimated: 38 mL/min — ABNORMAL LOW (ref >60)
Glucose, Bld: 86 mg/dL (ref 70–99)
Potassium: 4.2 mmol/L (ref 3.5–5.1)
Sodium: 141 mmol/L (ref 135–145)

## 2020-06-19 LAB — HEMOGLOBIN A1C
Hgb A1c MFr Bld: 5.7 % — ABNORMAL HIGH (ref 4.8–5.6)
Mean Plasma Glucose: 117 mg/dL

## 2020-06-19 MED ORDER — APIXABAN 2.5 MG PO TABS
2.5000 mg | ORAL_TABLET | Freq: Two times a day (BID) | ORAL | Status: DC
  Administered 2020-06-19: 2.5 mg via ORAL
  Filled 2020-06-19: qty 1

## 2020-06-19 NOTE — Telephone Encounter (Signed)
Patient needs HFU for in one week being DC today from [facility] DX at [facility],  Acute gastroenteritis secondary to Cryptosporidium  electrolyte abnormality due to G.I. loss acutely renal failure secondary to diarrhea

## 2020-06-19 NOTE — Telephone Encounter (Signed)
patient says he is doing fine has follow up with cardiology November 1 st per patient. She is suppose to Follow up with PCP she said .  Per Denisa note patient does not qualify for TCM.

## 2020-06-19 NOTE — Telephone Encounter (Signed)
Please confirm pt doing ok and I will work her in somewhere.  Just want to confirm doing ok.  Was not sure if TCM could be done, if so, then sne to denisa for f/u.  If not, then please call and confirm pt doing ok.

## 2020-06-19 NOTE — Discharge Summary (Signed)
[facility] [place] NAME: Dawn Sherman    MR#:  [id number]  DATE OF BIRTH:  December 04, 1955  DATE OF ADMISSION:  06/16/2020 ADMITTING PHYSICIAN: Ivor Costa, MD  DATE OF DISCHARGE: 06/19/2020  PRIMARY CARE PHYSICIAN: [name], MD    ADMISSION DIAGNOSIS:  Dehydration [E86.0] Dizziness [R42] Sinus bradycardia [R00.1] Diarrheal disease [K52.9] AKI (acute kidney injury) ([place]) [N17.9] Nausea vomiting and diarrhea [R11.2, R19.7] Nausea and vomiting [R11.2]  DISCHARGE DIAGNOSIS:   Acute gastroenteritis secondary to Cryptosporidium  electrolyte abnormality due to G.I. loss acutely renal failure secondary to diarrhea SECONDARY DIAGNOSIS:   Past Medical History:  Diagnosis Date  . B12 deficiency   . CHF (congestive heart failure) ([place])   . CKD (chronic kidney disease), stage III   . Depression   . Endometriosis   . Hypertension   . Mixed hyperlipidemia   . Tobacco abuse     HOSPITAL COURSE:   Dawn Sherman a 64 y.o.femalewith medical history significant ofhypertension, hyperlipidemia, GERD, depression, tobacco abuse, alcohol abuse in remission, CKD stage IIIb, CHF with EF 30-35%, PAF on Eliquis, who presents with nausea vomiting and diarrhea. Patient states that she has been having nausea vomiting and diarrhea for more than 3 days. She has had more than 5 times of watery diarrhea yday  Nausea vomiting and diarrhea secondary to Cryptosporidium  secretory diarrhea/acidosis C diff negative G.I. PCR positive for Cryptosporidium continue supportive care with IV fluids PRN Imodium -IV fluids with bicarb--Labs wnl today -As needed Zofran -patient stools are more formed  Hypokalemia -due to G.I. loss -K 3.6  Hypertension -rsume Entresto and Lasix  -IV hydralazine as needed -Continue amlodipine, Coreg  Mild depression (HCC) -Continue home medications  Tobacco abuse -Nicotine patch  Acute renal failure  superimposed on stage 3b chronic kidney disease ([place]): -Baseline creatinine 1.59 on 03/28/2020. -Came in with creatinine is 2.49--2.10--1.5 -likely due to prerenal failuresecondary to dehydration -resumed Lasix and Entresto at discharge  Chronic combined systolic and diastolic CHF (congestive heart failure) ([place]): 2D -echo on 02/10/2020 showed EF of 30-35% with grade 2 diastolic dysfunction.  -Patient'sCHF is compensated. -resumed all cardiac meds  Leukocytosis: WBC 21.8. Patient seems to have chronic intermittent leukocytosis. Her WBC was 18.9 on 06/15/2020. No bacterial source for infection identified. -wbc 12.4 today  HLD (hyperlipidemia) -Lipitor  Elevated troponin: Troponin is mildly elevated at 21, 23, 26, 29, no chest pain. Likely due to nonspecific elevation in the setting of worsening renal function -Continue Lipitor, Coreg -no chest pain  GERD (gastroesophageal reflux disease) -Protonix  Hypoglycemia:  resolved   DVT VBT:YOMAYOK Code Status:Full code Family Communication: not done, no family member is at bed side.  Disposition Plan: Anticipate discharge back to previous environment Consults called:None Admission status: Med-surg bed for inpatient    Status is: inpatient Dispo: The patient is from:Home Anticipated d/c is HT:XHFS Anticipated d/c date is today Patient currently is medically stable to d/c. Pt is in agreement with d/c planning. CONSULTS OBTAINED:    DRUG ALLERGIES:   Allergies  Allergen Reactions  . Amoxicillin   . Benicar [Olmesartan]     Talked with patient February 10, 2020, intolerance is unclear, tried several medications around that time and one of them gave her a rash but she is not clear which 1.  . Codeine Sulfate   . Morphine And Related   . Penicillins   . Sulfate   . Clindamycin/Lincomycin Rash    DISCHARGE MEDICATIONS:   Allergies  as of 06/19/2020      Reactions    Amoxicillin    Benicar [olmesartan]    Talked with patient February 10, 2020, intolerance is unclear, tried several medications around that time and one of them gave her a rash but she is not clear which 1.   Codeine Sulfate    Morphine And Related    Penicillins    Sulfate    Clindamycin/lincomycin Rash      Medication List    TAKE these medications   acetaminophen 325 MG tablet Commonly known as: TYLENOL Take 650 mg by mouth every 6 (six) hours as needed.   amLODipine 5 MG tablet Commonly known as: NORVASC Take 5 mg by mouth daily.   apixaban 2.5 MG Tabs tablet Commonly known as: ELIQUIS Take 1 tablet (2.5 mg total) by mouth 2 (two) times daily.   atorvastatin 40 MG tablet Commonly known as: LIPITOR Take 1 tablet (40 mg total) by mouth daily.   buPROPion 150 MG 24 hr tablet Commonly known as: WELLBUTRIN XL TAKE 1 TABLET BY MOUTH EVERY DAY What changed: additional instructions   carvedilol 12.5 MG tablet Commonly known as: COREG Take 1.5 tablets (18.75 mg total) by mouth 2 (two) times daily with a meal.   furosemide 20 MG tablet Commonly known as: Lasix Take 1 tablet (20 mg total) by mouth daily.   loratadine 10 MG tablet Commonly known as: CLARITIN Take 10 mg by mouth daily.   omeprazole 20 MG capsule Commonly known as: PRILOSEC Take 20 mg by mouth daily.   sacubitril-valsartan 24-26 MG Commonly known as: ENTRESTO Take 1 tablet by mouth 2 (two) times daily.   venlafaxine XR 150 MG 24 hr capsule Commonly known as: EFFEXOR-XR TAKE 1 CAPSULE BY MOUTH EVERY DAY What changed:   how much to take  additional instructions       If you experience worsening of your admission symptoms, develop shortness of breath, life threatening emergency, suicidal or homicidal thoughts you must seek medical attention immediately by calling 911 or calling your MD immediately  if symptoms less severe.  You Must read complete instructions/literature along with all the  possible adverse reactions/side effects for all the Medicines you take and that have been prescribed to you. Take any new Medicines after you have completely understood and accept all the possible adverse reactions/side effects.   Please note  You were cared for by a hospitalist during your hospital stay. If you have any questions about your discharge medications or the care you received while you were in the hospital after you are discharged, you can call the unit and asked to speak with the hospitalist on call if the hospitalist that took care of you is not available. Once you are discharged, your primary care physician will handle any further medical issues. Please note that NO REFILLS for any discharge medications will be authorized once you are discharged, as it is imperative that you return to your primary care physician (or establish a relationship with a primary care physician if you do not have one) for your aftercare needs so that they can reassess your need for medications and monitor your lab values. Today   SUBJECTIVE   I feel a whole lot better. Stools more formed. No nausea vomiting.  VITAL SIGNS:  Blood pressure 124/74, pulse 81, temperature 98.6 F (37 C), temperature source Oral, resp. rate 16, height 5\' 5"  (1.651 m), weight 55 kg, SpO2 100 %.  I/O:    Intake/Output Summary (  Last 24 hours) at 06/19/2020 0847 Last data filed at 06/18/2020 1300 Gross per 24 hour  Intake 600 ml  Output --  Net 600 ml    PHYSICAL EXAMINATION:  GENERAL:  64 y.o.-year-old patient lying in the bed with no acute distress.   HEENT: Head atraumatic, normocephalic. Oropharynx and nasopharynx clear.  NECK:  Supple, no jugular venous distention. No thyroid enlargement, no tenderness.  LUNGS: Normal breath sounds bilaterally, no wheezing, rales,rhonchi or crepitation. No use of accessory muscles of respiration.  CARDIOVASCULAR: S1, S2 normal. No murmurs, rubs, or gallops.  ABDOMEN: Soft,  non-tender, non-distended. Bowel sounds present. No organomegaly or mass.  EXTREMITIES: No pedal edema, cyanosis, or clubbing.  NEUROLOGIC: Cranial nerves II through XII are intact. Muscle strength 5/5 in all extremities. Sensation intact. Gait not checked.  PSYCHIATRIC: The patient is alert and oriented x 3.  SKIN: No obvious rash, lesion, or ulcer.   DATA REVIEW:   CBC  Recent Labs  Lab 06/18/20 0426  WBC 12.4*  HGB 11.3*  HCT 32.9*  PLT 80*    Chemistries  Recent Labs  Lab 06/15/20 2006 06/16/20 1117 06/17/20 0424 06/17/20 1521 06/19/20 0525  NA 134*   < >  --    < > 141  K 3.3*   < >  --    < > 4.2  CL 104   < >  --    < > 99  CO2 16*   < >  --    < > 31  GLUCOSE 60*   < >  --    < > 86  BUN 46*   < >  --    < > 29*  CREATININE 2.49*   < >  --    < > 1.52*  CALCIUM 9.0   < >  --    < > 7.9*  MG  --    < > 3.0*  --   --   AST 16  --   --   --   --   ALT 12  --   --   --   --   ALKPHOS 85  --   --   --   --   BILITOT 0.8  --   --   --   --    < > = values in this interval not displayed.    Microbiology Results   Recent Results (from the past 240 hour(s))  Respiratory Panel by RT PCR (Flu A&B, Covid) - Nasopharyngeal Swab     Status: None   Collection Time: 06/16/20  9:56 AM   Specimen: Nasopharyngeal Swab  Result Value Ref Range Status   SARS Coronavirus 2 by RT PCR NEGATIVE NEGATIVE Final    Comment: (NOTE) SARS-CoV-2 target nucleic acids are NOT DETECTED.  The SARS-CoV-2 RNA is generally detectable in upper respiratoy specimens during the acute phase of infection. The lowest concentration of SARS-CoV-2 viral copies this assay can detect is 131 copies/mL. A negative result does not preclude SARS-Cov-2 infection and should not be used as the sole basis for treatment or other patient management decisions. A negative result may occur with  improper specimen collection/handling, submission of specimen other than nasopharyngeal swab, presence of viral  mutation(s) within the areas targeted by this assay, and inadequate number of viral copies (<131 copies/mL). A negative result must be combined with clinical observations, patient history, and epidemiological information. The expected result is Negative.  Fact Sheet for Patients:  PinkCheek.be  Fact Sheet for Healthcare Providers:  GravelBags.it  This test is no t yet approved or cleared by the [place] FDA and  has been authorized for detection and/or diagnosis of SARS-CoV-2 by FDA under an Emergency Use Authorization (EUA). This EUA will remain  in effect (meaning this test can be used) for the duration of the COVID-19 declaration under Section 564(b)(1) of the Act, 21 U.S.C. section 360bbb-3(b)(1), unless the authorization is terminated or revoked sooner.     Influenza A by PCR NEGATIVE NEGATIVE Final   Influenza B by PCR NEGATIVE NEGATIVE Final    Comment: (NOTE) The Xpert Xpress SARS-CoV-2/FLU/RSV assay is intended as an aid in  the diagnosis of influenza from Nasopharyngeal swab specimens and  should not be used as a sole basis for treatment. Nasal washings and  aspirates are unacceptable for Xpert Xpress SARS-CoV-2/FLU/RSV  testing.  Fact Sheet for Patients: PinkCheek.be  Fact Sheet for Healthcare Providers: GravelBags.it  This test is not yet approved or cleared by the [place] FDA and  has been authorized for detection and/or diagnosis of SARS-CoV-2 by  FDA under an Emergency Use Authorization (EUA). This EUA will remain  in effect (meaning this test can be used) for the duration of the  Covid-19 declaration under Section 564(b)(1) of the Act, 21  U.S.C. section 360bbb-3(b)(1), unless the authorization is  terminated or revoked. Performed at [facility], [address] [place], [place] [address]   CULTURE, BLOOD (ROUTINE X 2) w  Reflex to ID Panel     Status: None (Preliminary result)   Collection Time: 06/16/20 12:26 PM   Specimen: BLOOD  Result Value Ref Range Status   Specimen Description BLOOD BLOOD LEFT HAND  Final   Special Requests   Final    BOTTLES DRAWN AEROBIC AND ANAEROBIC Blood Culture results may not be optimal due to an inadequate volume of blood received in culture bottles   Culture   Final    NO GROWTH 3 DAYS Performed at [facility], [address] [place], [place] [address]    Report Status PENDING  Incomplete  CULTURE, BLOOD (ROUTINE X 2) w Reflex to ID Panel     Status: None (Preliminary result)   Collection Time: 06/16/20 12:37 PM   Specimen: BLOOD  Result Value Ref Range Status   Specimen Description BLOOD LEFT ANTECUBITAL  Final   Special Requests   Final    BOTTLES DRAWN AEROBIC AND ANAEROBIC Blood Culture adequate volume   Culture   Final    NO GROWTH 3 DAYS Performed at [facility], [address], [place], [place] [address]    Report Status PENDING  Incomplete  C Difficile Quick Screen w PCR reflex     Status: None   Collection Time: 06/16/20  8:05 PM   Specimen: STOOL  Result Value Ref Range Status   C Diff antigen NEGATIVE NEGATIVE Final   C Diff toxin NEGATIVE NEGATIVE Final   C Diff interpretation No C. difficile detected.  Final    Comment: Performed at [facility], [address] [place], [place] [address]  Gastrointestinal Panel by PCR , Stool     Status: Abnormal   Collection Time: 06/16/20  8:05 PM   Specimen: STOOL  Result Value Ref Range Status   Campylobacter species NOT DETECTED NOT DETECTED Final   Plesimonas shigelloides NOT DETECTED NOT DETECTED Final   Salmonella species NOT DETECTED NOT DETECTED Final   Yersinia enterocolitica NOT DETECTED NOT DETECTED  Final   Vibrio species NOT DETECTED NOT DETECTED Final   Vibrio cholerae NOT DETECTED NOT DETECTED Final   Enteroaggregative E coli (EAEC) NOT DETECTED NOT DETECTED Final    Enteropathogenic E coli (EPEC) NOT DETECTED NOT DETECTED Final   Enterotoxigenic E coli (ETEC) NOT DETECTED NOT DETECTED Final   Shiga like toxin producing E coli (STEC) NOT DETECTED NOT DETECTED Final   Shigella/Enteroinvasive E coli (EIEC) NOT DETECTED NOT DETECTED Final   Cryptosporidium DETECTED (A) NOT DETECTED Final    Comment: RESULT CALLED TO, READ BACK BY AND VERIFIED WITH: SONGSTER, DAWN AT 2213 ON 06/16/20 BY SS    Cyclospora cayetanensis NOT DETECTED NOT DETECTED Final   Entamoeba histolytica NOT DETECTED NOT DETECTED Final   Giardia lamblia NOT DETECTED NOT DETECTED Final   Adenovirus F40/41 NOT DETECTED NOT DETECTED Final   Astrovirus NOT DETECTED NOT DETECTED Final   Norovirus GI/GII NOT DETECTED NOT DETECTED Final   Rotavirus A NOT DETECTED NOT DETECTED Final   Sapovirus (I, II, IV, and V) NOT DETECTED NOT DETECTED Final    Comment: Performed at [facility], [address] [place], [place] [address]    RADIOLOGY:  No results found.   CODE STATUS:     Code Status Orders  (From admission, onward)         Start     Ordered   06/16/20 1211  Full code  Continuous        06/16/20 1210        Code Status History    Date Active Date Inactive Code Status Order ID Comments User Context   02/10/2020 0954 02/13/2020 1719 Full Code [id number]  Ivor Costa, MD ED   Advance Care Planning Activity       TOTAL TIME TAKING CARE OF THIS PATIENT: *35* minutes.    Fritzi Mandes M.D  [facility]    CC: Primary care physician; [name], MD

## 2020-06-19 NOTE — Progress Notes (Signed)
Mobility Specialist - Progress Note   06/19/20 1100  Mobility  Range of Motion/Exercises Right leg;Left leg (hip abd, knee flex, hip ext/flex, hamsring curls, mini squat)  Level of Assistance Independent  Assistive Device None  Distance Ambulated (ft) 0 ft  Mobility Response Tolerated well  Mobility performed by Mobility specialist  $Mobility charge 1 Mobility    Pre-mobility: 93 HR, 97% SpO2 Post-mobility: 103 HR, 97% SpO2   Pt was sitting in recliner upon arrival utilizing room air. Pt agreed to session. Pt is independent in all mobility. Pt was able to perform standing exercises: hip flexion/ext, hip abduction, knee flexion, hamstring curls, and mini squats with no physical assistance. No LOB or heavy breathing noted. Overall, pt tolerated session well. Pt was ambulating independently to bathroom at time of exit.    Kathee Delton Mobility Specialist 06/19/20, 11:16 AM

## 2020-06-19 NOTE — Discharge Instructions (Signed)

## 2020-06-19 NOTE — Telephone Encounter (Signed)
Pt needs hospital follow up in one week. There are no available appointments in one week. Please call patient for appointment.

## 2020-06-19 NOTE — Consult Note (Signed)
PHARMACY CONSULT NOTE - FOLLOW UP  Pharmacy Consult for Electrolyte Monitoring and Replacement   Recent Labs: Potassium (mmol/L)  Date Value  06/19/2020 4.2   Magnesium (mg/dL)  Date Value  06/17/2020 3.0 (H)   Calcium (mg/dL)  Date Value  06/19/2020 7.9 (L)   Albumin (g/dL)  Date Value  06/15/2020 4.2   Sodium (mmol/L)  Date Value  06/19/2020 141  02/21/2020 132 (L)   Assessment: 64 year old female presenting with nausea, vomiting, lightheadedness, dizziness, and diarrhea. PMH includes HTN, HLD, GERD, depression, tobacco abuse, alcohol abuse in remission, CKD stage IIIb, CHF with EF 30-35%, and PAF on Eliquis. 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 K 2.9 --> 3.4.   Pt is also on sodium bicarbonate @ 75 mL/hr for metabolic acidosis, which can decrease potassium.   Electrolytes are WNL.   Goal of Therapy:  - K 4 for afib - All other electrolytes WNL  Plan:  - Electrolytes are WNL. No replacement needed at this time.  - Follow-up levels with AM labs  Rowland Lathe, PharmD 06/19/2020 7:40 AM

## 2020-06-19 NOTE — Progress Notes (Signed)
Pt given AVS at this time. Ok Edwards called in tele and made aware of order d/c. Pt states she has no questions at this time, and patient pulled out IV.

## 2020-06-19 NOTE — Telephone Encounter (Signed)
[employer] P[employer] is primary and does not qualify for TCM.

## 2020-06-19 NOTE — Plan of Care (Signed)
  Problem: Education: Goal: Knowledge of General Education information will improve Description: Including pain rating scale, medication(s)/side effects and non-pharmacologic comfort measures Outcome: Adequate for Discharge   Problem: Health Behavior/Discharge Planning: Goal: Ability to manage health-related needs will improve Outcome: Adequate for Discharge   Problem: Clinical Measurements: Goal: Ability to maintain clinical measurements within normal limits will improve Outcome: Adequate for Discharge Goal: Will remain free from infection Outcome: Adequate for Discharge Goal: Diagnostic test results will improve Outcome: Adequate for Discharge Goal: Respiratory complications will improve Outcome: Adequate for Discharge Goal: Cardiovascular complication will be avoided Outcome: Adequate for Discharge   Problem: Activity: Goal: Risk for activity intolerance will decrease Outcome: Adequate for Discharge   Problem: Nutrition: Goal: Adequate nutrition will be maintained Outcome: Adequate for Discharge   Problem: Coping: Goal: Level of anxiety will decrease Outcome: Adequate for Discharge   Problem: Elimination: Goal: Will not experience complications related to bowel motility Outcome: Adequate for Discharge Goal: Will not experience complications related to urinary retention Outcome: Adequate for Discharge   Problem: Pain Managment: Goal: General experience of comfort will improve Outcome: Adequate for Discharge   Problem: Safety: Goal: Ability to remain free from injury will improve Outcome: Adequate for Discharge   Problem: Skin Integrity: Goal: Risk for impaired skin integrity will decrease Outcome: Adequate for Discharge  Care plan completed at this time.

## 2020-06-20 NOTE — Telephone Encounter (Signed)
9:00 on 06/28/20 or 12:00 on 06/30/20.  I sent message to both Kerin Salen and Garnette Scheuermann

## 2020-06-20 NOTE — Telephone Encounter (Signed)
Scheduled 06/28/20

## 2020-06-21 LAB — CULTURE, BLOOD (ROUTINE X 2)
Culture: NO GROWTH
Culture: NO GROWTH
Special Requests: ADEQUATE

## 2020-06-26 ENCOUNTER — Encounter: Payer: Self-pay | Admitting: Family

## 2020-06-26 ENCOUNTER — Ambulatory Visit (INDEPENDENT_AMBULATORY_CARE_PROVIDER_SITE_OTHER): Payer: BC Managed Care – PPO | Admitting: Family

## 2020-06-26 ENCOUNTER — Other Ambulatory Visit: Payer: Self-pay

## 2020-06-26 VITALS — BP 160/80 | HR 92 | Ht 64.0 in | Wt 124.0 lb

## 2020-06-26 DIAGNOSIS — J449 Chronic obstructive pulmonary disease, unspecified: Secondary | ICD-10-CM

## 2020-06-26 DIAGNOSIS — N1832 Chronic kidney disease, stage 3b: Secondary | ICD-10-CM | POA: Diagnosis not present

## 2020-06-26 DIAGNOSIS — I5022 Chronic systolic (congestive) heart failure: Secondary | ICD-10-CM

## 2020-06-26 DIAGNOSIS — F17200 Nicotine dependence, unspecified, uncomplicated: Secondary | ICD-10-CM

## 2020-06-26 DIAGNOSIS — Z720 Tobacco use: Secondary | ICD-10-CM

## 2020-06-26 DIAGNOSIS — I480 Paroxysmal atrial fibrillation: Secondary | ICD-10-CM

## 2020-06-26 DIAGNOSIS — I10 Essential (primary) hypertension: Secondary | ICD-10-CM

## 2020-06-26 MED ORDER — FUROSEMIDE 20 MG PO TABS: 20.0000 mg | ORAL_TABLET | Freq: Every day | ORAL | 3 refills | Status: DC

## 2020-06-26 MED ORDER — CARVEDILOL 25 MG PO TABS: 25.0000 mg | ORAL_TABLET | Freq: Two times a day (BID) | ORAL | 3 refills | Status: DC

## 2020-06-26 NOTE — Progress Notes (Signed)
Office Visit    Patient Name: Dawn Sherman Date of Encounter: 06/26/2020  Primary Care Provider:  [name], MD Primary Cardiologist:  Ida Rogue, MD Electrophysiologist:  None   Chief Complaint    Dawn Sherman is a 64 y.o. female with a hx of congestive heart failure, HTN, HLD, tobacco use, CKD 3, prior alcohol use, depression presents today for hospital follow up.  Past Medical History    Past Medical History:  Diagnosis Date   B12 deficiency    CHF (congestive heart failure) ([place])    CKD (chronic kidney disease), stage III ([place])    Depression    Endometriosis    Hypertension    Mixed hyperlipidemia    Tobacco abuse    Past Surgical History:  Procedure Laterality Date   ABDOMINAL HYSTERECTOMY     BREAST BIOPSY Right 2015   neg-  FIBROADENOMA   TAH/RSO  1999   secondary to bleeding and endometriosis (Dr Vernie Ammons)    Allergies  Allergies  Allergen Reactions   Amoxicillin    Benicar [Olmesartan]     Talked with patient February 10, 2020, intolerance is unclear, tried several medications around that time and one of them gave her a rash but she is not clear which 1.   Codeine Sulfate    Morphine And Related    Penicillins    Sulfate    Clindamycin/Lincomycin Rash    History of Present Illness    Dawn Sherman is a 64 y.o. female with a hx of  congestive heart failure, HTN, HLD, tobacco use, CKD 3, prior alcohol use, PAF, depression, valvular heart disease, endometriosis, B12 deficiency last seen 05/03/20 by Dr. Rockey Situ via Telemedicine.  She is retired and enjoys spending time with her grandsons who are 53 and 68 years old and lives with her. She lives locally with her husband.  No formal exercise routine.  She was admitted 02/10/20 to [facility] for dyspnea. CXR showed COPD and chronic changes without active disease while CTA chest was negative for PE but showed small right greater than left bilateral pleural effusions, interstitial edema,  mild aortic atherosclerosis.  She was diagnosed with new onset PAF. She declined cardiac catheterization and subsequently had a Lexiscan Myoview showing no significant ischemia.  Echo 02/10/2020 LVEF 30 to 14%, grade 2 diastolic dysfunction, RV normal size and function with mildly elevated PASP, moderate MR, mild to moderate TR..    02/21/20 noted to be mildly hyperkalemic, but with repeat labs stable, encouraged to follow up with her nephrologist Dr. [name]. Her Eliquis had not been started at discharge as recommended and was started in clinic at reduced dose due to weight/renal function. In subsequent labs she had mild renal decline and was recommended to hold her Entresto. Per nephrology her baseline GFR is approximately 42.  Since discontinuation of the Entresto, labs 03/15/2020 show creatinine 1.49, GFR 37, BNP 3567. Her Delene Loll was resumed 03/24/20. In telemedicine 05/03/20 her Coreg was increased to 18.75mg  BID. Recommended to avoid spironolactone.   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 She was treated with IVF. She had AKI likely due to dehydration but her Lasix and Entresto were resumed at discharge.   Presents today for follow-up.  She reports feeling well since hospital discharge.  Reports no recurrent diarrhea.  No chest pain, pressure, tightness.  No shortness of breath at rest nor dyspnea on exertion.  She has not been taking her Lasix since discharge as she did not think she  was told to resume and notices trace edema to her ankles.   EKGs/Labs/Other Studies Reviewed:   The following studies were reviewed today:  Lexiscan Myoview 01/2020 Pharmacological myocardial perfusion imaging study with no significant  Ischemia Thinning in the anterior wall, fixed, likely secondary to processing error and significant GI uptake artifact Global hypokinesis,  EF estimated at 24% No EKG changes concerning for ischemia at peak stress or in recovery. CT attenuation  correction images with no significant coronary calcification, no aortic atherosclerosis Moderate risk scan given severely depressed ejection fraction  Echo 02/10/20 1. Left ventricular ejection fraction, by estimation, is 30 to 35%. The  left ventricle has moderately decreased function. The left ventricle  demonstrates global hypokinesis. The left ventricular internal cavity size  was mildly dilated. Left ventricular  diastolic parameters are consistent with Grade II diastolic dysfunction  (pseudonormalization).   2. Right ventricular systolic function is normal. The right ventricular  size is normal. There is mildly elevated pulmonary artery systolic  pressure.   3. The mitral valve is normal in structure. Moderate mitral valve  regurgitation. No evidence of mitral stenosis.   4. Tricuspid valve regurgitation is mild to moderate.   EKG: No EKG today  Recent Labs: 06/15/2020: ALT 12 06/16/2020: B Natriuretic Peptide 115.6 06/17/2020: Magnesium 3.0 06/18/2020: Hemoglobin 11.3; Platelets 80 06/19/2020: BUN 29; Creatinine, Ser 1.52; Potassium 4.2; Sodium 141  Recent Lipid Panel    Component Value Date/Time   CHOL 125 06/17/2020 0424   TRIG 65 06/17/2020 0424   HDL 63 06/17/2020 0424   CHOLHDL 2.0 06/17/2020 0424   VLDL 13 06/17/2020 0424   LDLCALC 49 06/17/2020 0424   LDLDIRECT 53.3 10/14/2012 0821    Home Medications   Current Meds  Medication Sig   acetaminophen (TYLENOL) 325 MG tablet Take 650 mg by mouth every 6 (six) hours as needed.   amLODipine (NORVASC) 5 MG tablet Take 5 mg by mouth daily.   apixaban (ELIQUIS) 2.5 MG TABS tablet Take 1 tablet (2.5 mg total) by mouth 2 (two) times daily.   atorvastatin (LIPITOR) 40 MG tablet Take 1 tablet (40 mg total) by mouth daily.   buPROPion (WELLBUTRIN XL) 150 MG 24 hr tablet TAKE 1 TABLET BY MOUTH EVERY DAY (Patient taking differently: Take 150 mg by mouth daily. TAKE 1 TABLET BY MOUTH EVERY DAY)   loratadine (CLARITIN)  10 MG tablet Take 10 mg by mouth daily.   omeprazole (PRILOSEC) 20 MG capsule Take 20 mg by mouth daily.   sacubitril-valsartan (ENTRESTO) 24-26 MG Take 1 tablet by mouth 2 (two) times daily.   venlafaxine XR (EFFEXOR-XR) 150 MG 24 hr capsule Take 150 mg by mouth daily.   [DISCONTINUED] carvedilol (COREG) 12.5 MG tablet Take 1.5 tablets (18.75 mg total) by mouth 2 (two) times daily with a meal.    Review of Systems    All other systems reviewed and are otherwise negative except as noted above.  Physical Exam    VS:  BP (!) 160/80 (BP Location: Left Arm, Patient Position: Sitting, Cuff Size: Normal)    Pulse 92    Ht 5\' 4"  (1.626 m)    Wt 124 lb (56.2 kg)    SpO2 95%    BMI 21.28 kg/m  , BMI Body mass index is 21.28 kg/m. GEN: Well nourished, well developed, in no acute distress. HEENT: normal. Neck: Supple, no JVD, carotid bruits, or masses. Cardiac: RRR, no murmurs, rubs, or gallops. No clubbing, cyanosis, edema.  Radials/DP/PT 2+ and equal  bilaterally.  Respiratory:  Respirations regular and unlabored, clear to auscultation bilaterally. GI: Soft, nontender, nondistended, BS + x 4. MS: No deformity or atrophy. Skin: Warm and dry, no rash. Neuro:  Strength and sensation are intact. Psych: Normal affect.  Assessment & Plan    1.  HFrEF-Echo 01/2020 LVEF 44-81%, grade 2 diastolic dysfunction.  Lexiscan Myoview 01/2020 with no evidence of significant ischemia.  NYHA I-II.  Euvolemic and well compensated on exam.  GDMT includes Coreg, Lasix, Entresto.  Will defer addition of MRA at this time due to CKD and hx of hyperkalemia.  Resume Lasix 20 mg daily as she was not aware to resume at hospital discharge.  Continue low-sodium diet and less than 2 L fluid restriction.   Plan for echocardiogram in December  2021 for reevaluation of LVEF. If EF not improved to greater than 35% will need to initiate discussion regarding ICD vs cardiac catheterization.   2. Valvular heart disease -Echo 01/2020  moderate MR, mild to moderate TR. continue optimal control of blood pressure and volume.  3. HTN -BP mildly elevated today in clinic but reports it is well controlled at home.  She will continue to monitor at home and report blood pressure consistently greater than 130/80.  Increase carvedilol to 25 mg twice daily.  4. CKD III - Follows with Dr. [name] of nephrology.  BMP in 1 week for monitoring as she is resuming Lasix 20mg  QD  5. COPD/Tobacco use -no signs of acute exacerbation. Smoking cessation encouraged. Recommend utilization of 1800QUITNOW.  6. PAF - Maintaining NSR. CHA2DS2-VASc score of at least 3 (gender, HTN, HF).  Continue Eliquis 2.5 mg twice daily.  Reduced dose due to weight and renal function.  Denies bleeding complications.  Disposition: Follow-up in 2 months with Dr. Rockey Situ or APP  Loel Dubonnet, NP 06/26/2020, 4:20 PM

## 2020-06-26 NOTE — Patient Instructions (Addendum)
Medication Instructions:  Your physician has recommended you make the following change in your medication:  CHANGE Carvedilol to 25mg  twice daily *you may use up your 12.5mg  tablets if you wish by taking 2 tablets twice daily  RESUME Lasix 20mg  daily  *If you need a refill on your cardiac medications before your next appointment, please call your pharmacy*   Lab Work: Your physician recommends that you return for lab work in: 1 week at [facility] for [facility].  You do NOT need to be fasting nor need an appointment.  Testing/Procedures: Your physician has requested that you have an echocardiogram in December. Echocardiography is a painless test that uses sound waves to create images of your heart. It provides your doctor with information about the size and shape of your heart and how well your heart's chambers and valves are working. This procedure takes approximately one hour. There are no restrictions for this procedure  Follow-Up: At [facility], you and your health needs are our priority.  As part of our continuing mission to provide you with exceptional heart care, we have created designated Provider Care Teams.  These Care Teams include your primary Cardiologist (physician) and Advanced Practice Providers (APPs -  Physician Assistants and Nurse Practitioners) who all work together to provide you with the care you need, when you need it.  We recommend signing up for the patient portal called "MyChart".  Sign up information is provided on this After Visit Summary.  MyChart is used to connect with patients for Virtual Visits (Telemedicine).  Patients are able to view lab/test results, encounter notes, upcoming appointments, etc.  Non-urgent messages can be sent to your provider as well.   To learn more about what you can do with MyChart, go to [email or web address].    Your next appointment:   2-3 month(s)  The format for your next appointment:   In Person  Provider:   You may  see Ida Rogue, MD or one of the following Advanced Practice Providers on your designated Care Team:    Murray Hodgkins, NP  Christell Faith, PA-C  Marrianne Mood, PA-C  Laurann Montana, NP  Cadence Kathlen Mody, [place]

## 2020-06-28 ENCOUNTER — Telehealth (INDEPENDENT_AMBULATORY_CARE_PROVIDER_SITE_OTHER): Payer: BC Managed Care – PPO | Admitting: Internal Medicine

## 2020-06-28 DIAGNOSIS — A072 Cryptosporidiosis: Secondary | ICD-10-CM

## 2020-06-28 DIAGNOSIS — F32A Depression, unspecified: Secondary | ICD-10-CM

## 2020-06-28 DIAGNOSIS — F1011 Alcohol abuse, in remission: Secondary | ICD-10-CM

## 2020-06-28 DIAGNOSIS — I5042 Chronic combined systolic (congestive) and diastolic (congestive) heart failure: Secondary | ICD-10-CM

## 2020-06-28 DIAGNOSIS — F320 Major depressive disorder, single episode, mild: Secondary | ICD-10-CM | POA: Diagnosis not present

## 2020-06-28 NOTE — Progress Notes (Signed)
Patient ID: Dawn Sherman, female   DOB: September 25, 1955, 64 y.o.   MRN: [id number]   Virtual Visit via telephone Note  This visit type was conducted due to national recommendations for restrictions regarding the COVID-19 pandemic (e.g. social distancing).  This format is felt to be most appropriate for this patient at this time.  All issues noted in this document were discussed and addressed.  No physical exam was performed (except for noted visual exam findings with Video Visits).   I connected with Dawn Sherman by telephone and verified that I am speaking with the correct person using two identifiers. Location patient: home Location provider: work Persons participating in the telephone visit: patient, provider  The limitations, risks, security and privacy concerns of performing an evaluation and management service by telephone and the availability of in person appointments have been discussed. It has also been discussed with the patient that there may be a patient responsible charge related to this service. The patient expressed understanding and agreed to proceed.   Reason for visit: hospital follow uppt  HPI: Admitted 06/16/20 - 06/19/20 with diarrhea - persistent.  Stool test positive for cryptosporidium.  Was given IVFs and bicarb. Electrolytes replaced.  Found to have acute renal failure.  Renal function improved with hydration.  Back on lasix and entresto.  States she is feeling better.  Eating.  No nausea, vomiting or diarrhea now.  No abdominal pain.  Energy better. Feels back to normal.      ROS: See pertinent positives and negatives per HPI.  Past Medical History:  Diagnosis Date  . B12 deficiency   . CHF (congestive heart failure) ([place])   . CKD (chronic kidney disease), stage III ([place])   . Depression   . Endometriosis   . Hypertension   . Mixed hyperlipidemia   . Tobacco abuse     Past Surgical History:  Procedure Laterality Date  . ABDOMINAL HYSTERECTOMY    . BREAST  BIOPSY Right 2015   neg-  FIBROADENOMA  . TAH/RSO  1999   secondary to bleeding and endometriosis (Dr Dawn Sherman)    Family History  Problem Relation Age of Onset  . Hypercholesterolemia Mother   . Breast cancer Neg Hx   . Colon cancer Neg Hx     SOCIAL HX: reviewed.    Current Outpatient Medications:  .  acetaminophen (TYLENOL) 325 MG tablet, Take 650 mg by mouth every 6 (six) hours as needed., Disp: , Rfl:  .  amLODipine (NORVASC) 5 MG tablet, Take 5 mg by mouth daily., Disp: , Rfl:  .  apixaban (ELIQUIS) 2.5 MG TABS tablet, Take 1 tablet (2.5 mg total) by mouth 2 (two) times daily., Disp: 60 tablet, Rfl: 11 .  atorvastatin (LIPITOR) 40 MG tablet, Take 1 tablet (40 mg total) by mouth daily., Disp: 90 tablet, Rfl: 1 .  buPROPion (WELLBUTRIN XL) 150 MG 24 hr tablet, TAKE 1 TABLET BY MOUTH EVERY DAY (Patient taking differently: Take 150 mg by mouth daily. TAKE 1 TABLET BY MOUTH EVERY DAY), Disp: 90 tablet, Rfl: 1 .  carvedilol (COREG) 25 MG tablet, Take 1 tablet (25 mg total) by mouth 2 (two) times daily., Disp: 180 tablet, Rfl: 3 .  furosemide (LASIX) 20 MG tablet, Take 1 tablet (20 mg total) by mouth daily., Disp: 90 tablet, Rfl: 3 .  loratadine (CLARITIN) 10 MG tablet, Take 10 mg by mouth daily., Disp: , Rfl:  .  omeprazole (PRILOSEC) 20 MG capsule, Take 20 mg by mouth daily., Disp: ,  Rfl:  .  sacubitril-valsartan (ENTRESTO) 24-26 MG, Take 1 tablet by mouth 2 (two) times daily., Disp: 180 tablet, Rfl: 2 .  venlafaxine XR (EFFEXOR-XR) 150 MG 24 hr capsule, Take 150 mg by mouth daily., Disp: , Rfl:   EXAM:  GENERAL: alert.  Sounds to be in no acute distress.  Answering questions appropriately.   PSYCH/NEURO: pleasant and cooperative, no obvious depression or anxiety, speech and thought processing grossly intact  ASSESSMENT AND PLAN:  Discussed the following assessment and plan:  Problem List Items Addressed This Visit    Mild depression ([place])    Doing well on wellbutrin and  effexor.  Follow.       History of alcohol abuse    Has quit drinking.  Follow.       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  Stool culture positive for cryptosporidium.  Hydrated.  Bowels back to normal now.  Eating.  No nausea or vomiting.  Back on her regular medication.  Follow.       Chronic combined systolic and diastolic CHF (congestive heart failure) ([facility])    Back on entresto, lasix and coreg.  No sob.  Stable.  Follow.           I discussed the assessment and treatment plan with the patient. The patient was provided an opportunity to ask questions and all were answered. The patient agreed with the plan and demonstrated an understanding of the instructions.   The patient was advised to call back or seek an in-person evaluation if the symptoms worsen or if the condition fails to improve as anticipated.  I provided 23 minutes of non-face-to-face time during this encounter.   [name], MD

## 2020-07-07 ENCOUNTER — Telehealth: Payer: Self-pay | Admitting: Cardiovascular Disease

## 2020-07-07 NOTE — Telephone Encounter (Signed)
Pt c/o medication issue:  1. Name of Medication: eliquis  2. How are you currently taking this medication (dosage and times per day)? 2.5 mg bid  3. Are you having a reaction (difficulty breathing--STAT)?   4. What is your medication issue? Cost of medication is too much for patient. This is patient first time having to pay for RX after using a coupon and is unable to pay for it now  Please advise

## 2020-07-07 NOTE — Telephone Encounter (Signed)
Spoke with the patient. Pt sts that she went to the pharmacy today to pick up her Eliquis refill. She was told that her out of pocket cost for the prescription would be $953 which she could not afford. She will be out of her Eliquis on Mon 07/10/20. Adv the patient that I will leave samples at the front desk for her to pick up. She will come by on Monday. Its sound as though she was given a copay card previously. I will also place another Eliquis copay card in the bag. Adv her to call to activate the card and take it with her to her pharmacy.  Patient verbalized understanding and voiced appreciation for the assistance.  Samples of this drug were given to the patient,  Eliquis 2.5 mg quantity 2 boxes  Lot Number [id number]

## 2020-07-07 NOTE — Telephone Encounter (Signed)
Likely increased cost as her primary coverage is Medicare and likely in "donut hole". Will reset in January. Low suspicion she will be able to use copay card.  Filled out patient assistance which will be placed with her samples. When she picks up samples she will fill out the 'number of persons in household', 'estimated monthly/yearly income', sign the form and we can submit.   Loel Dubonnet, NP

## 2020-07-09 ENCOUNTER — Encounter: Payer: Self-pay | Admitting: Internal Medicine

## 2020-07-09 NOTE — Assessment & Plan Note (Signed)
Doing well on wellbutrin and effexor.  Follow.

## 2020-07-09 NOTE — Assessment & Plan Note (Signed)
Admitted with diarrhea.  Stool culture positive for cryptosporidium.  Hydrated.  Bowels back to normal now.  Eating.  No nausea or vomiting.  Back on her regular medication.  Follow.

## 2020-07-09 NOTE — Assessment & Plan Note (Signed)
Has quit drinking.  Follow.

## 2020-07-09 NOTE — Assessment & Plan Note (Signed)
Back on entresto, lasix and coreg.  No sob.  Stable.  Follow.

## 2020-07-27 ENCOUNTER — Other Ambulatory Visit: Payer: Self-pay

## 2020-07-27 ENCOUNTER — Other Ambulatory Visit
Admission: RE | Admit: 2020-07-27 | Discharge: 2020-07-27 | Disposition: A | Discharge status: Home / Self Care | Payer: BC Managed Care – PPO | Attending: Family | Admitting: Family

## 2020-07-27 DIAGNOSIS — N1832 Chronic kidney disease, stage 3b: Secondary | ICD-10-CM | POA: Diagnosis present

## 2020-07-27 DIAGNOSIS — I5022 Chronic systolic (congestive) heart failure: Secondary | ICD-10-CM | POA: Diagnosis present

## 2020-07-27 LAB — BASIC METABOLIC PANEL
Anion gap: 10 (ref 5–15)
BUN: 32 mg/dL — ABNORMAL HIGH (ref 8–23)
CO2: 19 mmol/L — ABNORMAL LOW (ref 22–32)
Calcium: 9 mg/dL (ref 8.9–10.3)
Chloride: 102 mmol/L (ref 98–111)
Creatinine, Ser: 2.22 mg/dL — ABNORMAL HIGH (ref 0.44–1.00)
GFR, Estimated: 24 mL/min — ABNORMAL LOW (ref >60)
Glucose, Bld: 105 mg/dL — ABNORMAL HIGH (ref 70–99)
Potassium: 3.6 mmol/L (ref 3.5–5.1)
Sodium: 131 mmol/L — ABNORMAL LOW (ref 135–145)

## 2020-07-28 ENCOUNTER — Telehealth: Payer: Self-pay | Admitting: *Deleted

## 2020-07-28 DIAGNOSIS — I5042 Chronic combined systolic (congestive) and diastolic (congestive) heart failure: Secondary | ICD-10-CM

## 2020-07-28 DIAGNOSIS — Z79899 Other long term (current) drug therapy: Secondary | ICD-10-CM

## 2020-07-28 NOTE — Telephone Encounter (Signed)
Attempted to call pt w/ lab results and recc below. No answer. LMOM TCB.

## 2020-07-28 NOTE — Telephone Encounter (Signed)
Spoke to pt. Notified of lab results and recc below. Pt verbalized understanding, she will stop Furosemide until follow up (scheduled 08/28/20), hold Entresto 3 days and restart on Tuesday 12/7 24-26mg  BID. She will have repeat B[facility]. Pt aware to call our office w/ wt gain of 2 lbs overnight or 5 lbs in one week. Lab orders placed. Pt has no further questions at this time.

## 2020-07-28 NOTE — Telephone Encounter (Signed)
-----   Message from Loel Dubonnet, NP sent at 07/28/2020  7:48 AM EST ----- Kidney numbers worse compared to previous despite reducing dose of Furosemide. Recommend stopping Furosemide until seen in follow up. Call to report weight gain of 2 pounds overnight or 5 pounds in one week. Recommend holding Entresto for 3 days then resume at 24-26mg  BID. Repeat BMP in 1 week.

## 2020-08-01 NOTE — Telephone Encounter (Signed)
Spoke with patient and reviewed results with information that requested she hold fluid pill until upcoming appointment. Advised she should try compression socks/hose along with leg elevation when possible. Instructed her to call back if it should get worse or if she should have any further problems. She verbalized understanding of our conversation, agreement with plan, and had no further questions at this time.

## 2020-08-01 NOTE — Telephone Encounter (Signed)
Pt c/o swelling: STAT is pt has developed SOB within 24 hours  1) How much weight have you gained and in what time span? no  2) If swelling, where is the swelling located? ankles  3) Are you currently taking a fluid pill? no  4) Are you currently SOB? no  5) Do you have a log of your daily weights (if so, list)? 118.8 consistently  6) Have you gained 3 pounds in a day or 5 pounds in a week? no  7) Have you traveled recently? no  Patient calling in stating after med change the swelling has started back

## 2020-08-04 ENCOUNTER — Other Ambulatory Visit: Payer: Self-pay

## 2020-08-04 ENCOUNTER — Emergency Department
Admission: EM | Admit: 2020-08-04 | Discharge: 2020-08-04 | Disposition: A | Discharge status: Home / Self Care | Payer: Medicare Other | Attending: Emergency Medicine | Admitting: Emergency Medicine

## 2020-08-04 ENCOUNTER — Telehealth: Payer: Self-pay

## 2020-08-04 ENCOUNTER — Other Ambulatory Visit: Payer: Self-pay | Admitting: Internal Medicine

## 2020-08-04 ENCOUNTER — Other Ambulatory Visit
Admission: RE | Admit: 2020-08-04 | Discharge: 2020-08-04 | Disposition: A | Discharge status: Home / Self Care | Payer: BC Managed Care – PPO | Source: Ambulatory Visit | Attending: Family | Admitting: Family

## 2020-08-04 DIAGNOSIS — E875 Hyperkalemia: Secondary | ICD-10-CM | POA: Diagnosis not present

## 2020-08-04 DIAGNOSIS — R799 Abnormal finding of blood chemistry, unspecified: Secondary | ICD-10-CM | POA: Diagnosis present

## 2020-08-04 DIAGNOSIS — I5042 Chronic combined systolic (congestive) and diastolic (congestive) heart failure: Secondary | ICD-10-CM | POA: Insufficient documentation

## 2020-08-04 DIAGNOSIS — Z7901 Long term (current) use of anticoagulants: Secondary | ICD-10-CM | POA: Insufficient documentation

## 2020-08-04 DIAGNOSIS — Z79899 Other long term (current) drug therapy: Secondary | ICD-10-CM | POA: Diagnosis not present

## 2020-08-04 DIAGNOSIS — I130 Hypertensive heart and chronic kidney disease with heart failure and stage 1 through stage 4 chronic kidney disease, or unspecified chronic kidney disease: Secondary | ICD-10-CM | POA: Diagnosis not present

## 2020-08-04 DIAGNOSIS — N1832 Chronic kidney disease, stage 3b: Secondary | ICD-10-CM | POA: Insufficient documentation

## 2020-08-04 DIAGNOSIS — J069 Acute upper respiratory infection, unspecified: Secondary | ICD-10-CM | POA: Diagnosis not present

## 2020-08-04 DIAGNOSIS — F17210 Nicotine dependence, cigarettes, uncomplicated: Secondary | ICD-10-CM | POA: Insufficient documentation

## 2020-08-04 LAB — CBC WITH DIFFERENTIAL/PLATELET
Abs Immature Granulocytes: 0.06 10*3/uL (ref 0.00–0.07)
Basophils Absolute: 0.1 10*3/uL (ref 0.0–0.1)
Basophils Relative: 1 %
Eosinophils Absolute: 0.2 10*3/uL (ref 0.0–0.5)
Eosinophils Relative: 2 %
HCT: 37.9 % (ref 36.0–46.0)
Hemoglobin: 12.2 g/dL (ref 12.0–15.0)
Immature Granulocytes: 1 %
Lymphocytes Relative: 19 %
Lymphs Abs: 2.4 10*3/uL (ref 0.7–4.0)
MCH: 28.3 pg (ref 26.0–34.0)
MCHC: 32.2 g/dL (ref 30.0–36.0)
MCV: 87.9 fL (ref 80.0–100.0)
Monocytes Absolute: 1 10*3/uL (ref 0.1–1.0)
Monocytes Relative: 8 %
Neutro Abs: 8.8 10*3/uL — ABNORMAL HIGH (ref 1.7–7.7)
Neutrophils Relative %: 69 %
Platelets: 206 10*3/uL (ref 150–400)
RBC: 4.31 MIL/uL (ref 3.87–5.11)
RDW: 18.4 % — ABNORMAL HIGH (ref 11.5–15.5)
WBC: 12.6 10*3/uL — ABNORMAL HIGH (ref 4.0–10.5)
nRBC: 0 % (ref 0.0–0.2)

## 2020-08-04 LAB — TROPONIN I (HIGH SENSITIVITY): Troponin I (High Sensitivity): 6 ng/L (ref <18)

## 2020-08-04 LAB — BASIC METABOLIC PANEL
Anion gap: 8 (ref 5–15)
Anion gap: 6 (ref 5–15)
BUN: 19 mg/dL (ref 8–23)
BUN: 19 mg/dL (ref 8–23)
CO2: 23 mmol/L (ref 22–32)
CO2: 22 mmol/L (ref 22–32)
Calcium: 8.8 mg/dL — ABNORMAL LOW (ref 8.9–10.3)
Calcium: 8.4 mg/dL — ABNORMAL LOW (ref 8.9–10.3)
Chloride: 99 mmol/L (ref 98–111)
Chloride: 104 mmol/L (ref 98–111)
Creatinine, Ser: 1.57 mg/dL — ABNORMAL HIGH (ref 0.44–1.00)
Creatinine, Ser: 1.57 mg/dL — ABNORMAL HIGH (ref 0.44–1.00)
GFR, Estimated: 36 mL/min — ABNORMAL LOW (ref >60)
GFR, Estimated: 36 mL/min — ABNORMAL LOW (ref >60)
Glucose, Bld: 91 mg/dL (ref 70–99)
Glucose, Bld: 118 mg/dL — ABNORMAL HIGH (ref 70–99)
Potassium: 6.9 mmol/L (ref 3.5–5.1)
Potassium: 5.5 mmol/L — ABNORMAL HIGH (ref 3.5–5.1)
Sodium: 130 mmol/L — ABNORMAL LOW (ref 135–145)
Sodium: 132 mmol/L — ABNORMAL LOW (ref 135–145)

## 2020-08-04 LAB — MAGNESIUM: Magnesium: 2 mg/dL (ref 1.7–2.4)

## 2020-08-04 MED ORDER — SODIUM ZIRCONIUM CYCLOSILICATE 10 G PO PACK
10.0000 g | PACK | ORAL | Status: AC
  Administered 2020-08-04: 10 g via ORAL
  Filled 2020-08-04: qty 1

## 2020-08-04 NOTE — Discharge Instructions (Addendum)
Follow-up with your doctor for lab recheck within 1 week.  Continue taking your home medications as prescribed.

## 2020-08-04 NOTE — Telephone Encounter (Signed)
Thank you!!  Creedence Kunesh S Jaselle Pryer, NP  

## 2020-08-04 NOTE — ED Triage Notes (Signed)
Pt to ED from PCP for abnormal labs, states was sent for abnormal potassium  NAD noted, ambulatory to triage

## 2020-08-04 NOTE — Telephone Encounter (Signed)
Spoke with patient and reviewed results and recommendations to present to ED. Patient verbalized understanding and called ED and spoke with Opal Sidles RN to make her aware of patient coming with Potassium of 6.9. She verbalized understanding with no further questions at this time.

## 2020-08-04 NOTE — Telephone Encounter (Signed)
Incoming call received from [place] at the [facility] lab.  Katie called to report a critical lab value for the patient of a K+ 6.9.  Message fwd to the ordering provider Laurann Montana, NP and nurse Romualdo Bolk, RN.

## 2020-08-04 NOTE — ED Notes (Signed)
Says she was called by her doctor due to potassium was off.  Had the blood work done this am as routine by dr Karl Bales.  She has no complaints and in nad.

## 2020-08-04 NOTE — ED Provider Notes (Signed)
[facility] Emergency Department Provider Note  ____________________________________________  Time seen: Approximately 2:07 PM  I have reviewed the triage vital signs and the nursing notes.   HISTORY  Chief Complaint Abnormal Lab    HPI Dawn Sherman is a 64 y.o. female with a history of CHF CKD endometriosis hypertension who was sent to the ED due to hyperkalemia.  Patient denies any acute symptoms at all.  She had a routine visit with her PCP 3 days ago, had labs done.  PCP was notified today that the potassium on those labs had resulted at 6.9.  They called the patient and had her come to the ED for immediate evaluation.  Patient denies any acute symptoms.  She is not taking potassium supplements.    Her doctor had recently encouraged her to discontinue her Lasix that she was taking for ankle swelling, but she is currently taking 1/2 tablet daily.  Reports no leg or ankle swelling at the present time.  She is eating and drinking normally.  No diarrhea or constipation, no vomiting.     Past Medical History:  Diagnosis Date  . B12 deficiency   . CHF (congestive heart failure) ([place])   . CKD (chronic kidney disease), stage III ([place])   . Depression   . Endometriosis   . Hypertension   . Mixed hyperlipidemia   . Tobacco abuse      Patient Active Problem List   Diagnosis Date Noted  . Nausea and vomiting 06/17/2020  . Dehydration   . 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 Nausea vomiting and diarrhea 06/16/2020  . Hypoglycemia 06/16/2020  . Chronic combined systolic and diastolic CHF (congestive heart failure) ([place]) 02/10/2020  . Leukocytosis 02/10/2020  . HLD (hyperlipidemia) 02/10/2020  . Elevated troponin 02/10/2020  . GERD (gastroesophageal reflux disease) 02/10/2020  . Acute respiratory failure with hypoxia ([place]) 02/10/2020  . Abnormal mammogram 07/13/2019  . Sleep concern 10/17/2018  . History of alcohol abuse 07/31/2018  . B12  deficiency 07/31/2018  . Acute renal failure superimposed on stage 3b chronic kidney disease ([place]) 11/03/2016  . Health care maintenance 12/02/2014  . Tobacco abuse 12/02/2014  . Foot pain, left 11/29/2014  . Hypercholesterolemia 11/29/2014  . Encounter for screening colonoscopy 12/16/2013  . Lump or mass in breast 12/16/2013  . Breast mass 12/15/2012  . Hypertension 07/05/2012  . Mild depression ([place]) 07/05/2012     Past Surgical History:  Procedure Laterality Date  . ABDOMINAL HYSTERECTOMY    . BREAST BIOPSY Right 2015   neg-  FIBROADENOMA  . TAH/RSO  1999   secondary to bleeding and endometriosis (Dr Vernie Ammons)     Prior to Admission medications   Medication Sig Start Date End Date Taking? Authorizing Provider  amLODipine (NORVASC) 5 MG tablet TAKE 1 TABLET BY MOUTH EVERY DAY 08/04/20  Yes [name], MD  apixaban (ELIQUIS) 2.5 MG TABS tablet Take 1 tablet (2.5 mg total) by mouth 2 (two) times daily. 02/21/20  Yes Loel Dubonnet, NP  atorvastatin (LIPITOR) 40 MG tablet Take 1 tablet (40 mg total) by mouth daily. 02/14/20  Yes Rai, Ripudeep K, MD  buPROPion (WELLBUTRIN XL) 150 MG 24 hr tablet TAKE 1 TABLET BY MOUTH EVERY DAY Patient taking differently: Take 150 mg by mouth daily. TAKE 1 TABLET BY MOUTH EVERY DAY 06/16/20  Yes [name], MD  carvedilol (COREG) 25 MG tablet Take 1 tablet (25 mg total) by mouth 2 (two) times daily. 06/26/20 06/21/21 Yes Loel Dubonnet,  NP  furosemide (LASIX) 20 MG tablet Take 1 tablet (20 mg total) by mouth daily. 06/26/20 06/26/21 Yes Loel Dubonnet, NP  loratadine (CLARITIN) 10 MG tablet Take 10 mg by mouth daily.   Yes [provider]  omeprazole (PRILOSEC) 20 MG capsule Take 20 mg by mouth daily.   Yes [provider]  sacubitril-valsartan (ENTRESTO) 24-26 MG Take 1 tablet by mouth 2 (two) times daily. 02/13/20  Yes Strader, Fransisco Hertz, PA-C  venlafaxine XR (EFFEXOR-XR) 150 MG 24 hr capsule Take 150 mg by mouth  daily.   Yes [provider]  acetaminophen (TYLENOL) 325 MG tablet Take 650 mg by mouth every 6 (six) hours as needed.    [provider]     Allergies Amoxicillin, Benicar [olmesartan], Codeine sulfate, Morphine and related, Penicillins, Sulfate, and Clindamycin/lincomycin   Family History  Problem Relation Age of Onset  . Hypercholesterolemia Mother   . Breast cancer Neg Hx   . Colon cancer Neg Hx     Social History Social History   Tobacco Use  . Smoking status: Current Every Day Smoker    Packs/day: 0.50    Years: 40.00    Pack years: 20.00    Types: Cigarettes  . Smokeless tobacco: Never Used  Vaping Use  . Vaping Use: Never used  Substance Use Topics  . Alcohol use: Not Currently    Alcohol/week: 0.0 standard drinks    Comment: occasional  . Drug use: No    Review of Systems  Constitutional:   No fever or chills.  ENT:   No sore throat. No rhinorrhea. Cardiovascular:   No chest pain or syncope. Respiratory:   No dyspnea or cough. Gastrointestinal:   Negative for abdominal pain, vomiting and diarrhea.  Musculoskeletal:   Negative for focal pain or swelling All other systems reviewed and are negative except as documented above in ROS and HPI.  ____________________________________________   PHYSICAL EXAM:  VITAL SIGNS: ED Triage Vitals  Enc Vitals Group     BP 08/04/20 1128 (!) 141/99     Pulse Rate 08/04/20 1128 76     Resp 08/04/20 1128 18     Temp 08/04/20 1128 98.9 F (37.2 C)     Temp Source 08/04/20 1128 Oral     SpO2 08/04/20 1128 98 %     Weight 08/04/20 1129 118 lb (53.5 kg)     Height 08/04/20 1129 5\' 5"  (1.651 m)     Head Circumference --      Peak Flow --      Pain Score 08/04/20 1128 0     Pain Loc --      Pain Edu? --      Excl. in [place]? --     Vital signs reviewed, nursing assessments reviewed.   Constitutional:   Alert and oriented. Non-toxic appearance.  Energetic, good spirits. Eyes:   Conjunctivae are  normal. EOMI. PERRL. ENT      Head:   Normocephalic and atraumatic.      Nose:   Wearing a mask.      Mouth/Throat:   Wearing a mask.      Neck:   No meningismus. Full ROM. Hematological/Lymphatic/Immunilogical:   No cervical lymphadenopathy. Cardiovascular:   RRR. Symmetric bilateral radial and DP pulses.  No murmurs. Cap refill less than 2 seconds. Respiratory:   Normal respiratory effort without tachypnea/retractions. Breath sounds are clear and equal bilaterally. No wheezes/rales/rhonchi. Gastrointestinal:   Soft and nontender. Non distended. There is no  CVA tenderness.  No rebound, rigidity, or guarding. Musculoskeletal:   Normal range of motion in all extremities. No joint effusions.  No lower extremity tenderness.  No edema. Neurologic:   Normal speech and language.  Motor grossly intact. No acute focal neurologic deficits are appreciated.  Skin:    Skin is warm, dry and intact. No rash noted.  No petechiae, purpura, or bullae.  ____________________________________________    LABS (pertinent positives/negatives) (all labs ordered are listed, but only abnormal results are displayed) Labs Reviewed  CBC WITH DIFFERENTIAL/PLATELET - Abnormal; Notable for the following components:      Result Value   WBC 12.6 (*)    RDW 18.4 (*)    Neutro Abs 8.8 (*)    All other components within normal limits  BASIC METABOLIC PANEL - Abnormal; Notable for the following components:   Sodium 132 (*)    Potassium 5.5 (*)    Glucose, Bld 118 (*)    Creatinine, Ser 1.57 (*)    Calcium 8.4 (*)    GFR, Estimated 36 (*)    All other components within normal limits  MAGNESIUM  TROPONIN I (HIGH SENSITIVITY)   ____________________________________________   EKG Interpreted by me Normal sinus rhythm rate of 73, normal axis.  Normal intervals.  Normal QRS and ST segments.  Diffuse T wave inversions in inferior and lateral leads, unchanged from previous EKG  06/20/2020..   ____________________________________________    RADIOLOGY  No results found.  ____________________________________________   PROCEDURES Procedures  ____________________________________________    CLINICAL IMPRESSION / ASSESSMENT AND PLAN / ED COURSE  Medications ordered in the ED: Medications  sodium zirconium cyclosilicate (LOKELMA) packet 10 g (10 g Oral Given 08/04/20 1359)    Pertinent labs & imaging results that were available during my care of the patient were reviewed by me and considered in my medical decision making (see chart for details).  Dawn Sherman was evaluated in Emergency Department on 08/04/2020 for the symptoms described in the history of present illness. She was evaluated in the context of the global COVID-19 pandemic, which necessitated consideration that the patient might be at risk for infection with the SARS-CoV-2 virus that causes COVID-19. Institutional protocols and algorithms that pertain to the evaluation of patients at risk for COVID-19 are in a state of rapid change based on information released by regulatory bodies including the CDC and federal and state organizations. These policies and algorithms were followed during the patient's care in the ED.   Patient sent to ED due to incidental finding of hypokalemia with a level of 6.9 on outpatient lab.  Patient has no acute symptoms, is very well-appearing, vital signs and exam are benign and reassuring.  On recheck today, potassium is 5.5, creatinine is at baseline, EKG is at baseline.  No further work-up indicated, no cardiac work-up needed.  Patient given a dose of Lokelma, can discharge home to follow-up with her doctor and repeat labs next week.      ____________________________________________   FINAL CLINICAL IMPRESSION(S) / ED DIAGNOSES    Final diagnoses:  Hyperkalemia  Stage 3b chronic kidney disease [facility])     ED Discharge Orders    None      Portions of this  note were generated with dragon dictation software. Dictation errors may occur despite best attempts at proofreading.   Carrie Mew, MD 08/04/20 1415

## 2020-08-04 NOTE — Telephone Encounter (Signed)
Called and spoke with hospital lab - sample is not hemolyzed which makes it more likely the elevated potassium is a true reading and not error.   Recommend she present to the ED for evaluation. This will allow them to promptly repeat her potassium levels.  Loel Dubonnet, NP

## 2020-08-08 ENCOUNTER — Other Ambulatory Visit: Payer: BC Managed Care – PPO

## 2020-08-09 ENCOUNTER — Other Ambulatory Visit: Payer: Self-pay

## 2020-08-09 ENCOUNTER — Ambulatory Visit (INDEPENDENT_AMBULATORY_CARE_PROVIDER_SITE_OTHER): Payer: Medicare Other

## 2020-08-09 DIAGNOSIS — I5022 Chronic systolic (congestive) heart failure: Secondary | ICD-10-CM | POA: Diagnosis not present

## 2020-08-09 LAB — ECHOCARDIOGRAM COMPLETE
AR max vel: 2.38 cm2
AV Area VTI: 2.11 cm2
AV Area mean vel: 2.16 cm2
AV Mean grad: 4 mmHg
AV Peak grad: 7.8 mmHg
Ao pk vel: 1.4 m/s
Area-P 1/2: 4.63 cm2
Calc EF: 56.1 %
S' Lateral: 4.4 cm
Single Plane A2C EF: 56.2 %
Single Plane A4C EF: 54 %

## 2020-08-10 ENCOUNTER — Telehealth: Payer: Self-pay | Admitting: *Deleted

## 2020-08-10 ENCOUNTER — Encounter: Payer: Self-pay | Admitting: Internal Medicine

## 2020-08-10 NOTE — Telephone Encounter (Signed)
Spoke to pt. Notified of echo results. Pt verbalized understanding.  Pt notified me that Eliquis is too expensive for her. She has already used co-pay card. Samples for 4 weeks left at front for pt. Pt states she does not qualify for pt assistance.  Pt asks are there any other recommended options?

## 2020-08-10 NOTE — Telephone Encounter (Signed)
-----   Message from Loel Dubonnet, NP sent at 08/10/2020 12:10 PM EST ----- Echocardiogram shows heart pumping function has returned to normal. Great result! Heart is mildly stiff which is a very common. Her mitral valve was mildly leaky this has improved compared to previous echo! Overall great results.

## 2020-08-11 ENCOUNTER — Encounter: Payer: Self-pay | Admitting: Internal Medicine

## 2020-08-11 ENCOUNTER — Telehealth (INDEPENDENT_AMBULATORY_CARE_PROVIDER_SITE_OTHER): Payer: BC Managed Care – PPO | Admitting: Internal Medicine

## 2020-08-11 DIAGNOSIS — E7800 Pure hypercholesterolemia, unspecified: Secondary | ICD-10-CM | POA: Diagnosis not present

## 2020-08-11 DIAGNOSIS — F320 Major depressive disorder, single episode, mild: Secondary | ICD-10-CM

## 2020-08-11 DIAGNOSIS — I10 Essential (primary) hypertension: Secondary | ICD-10-CM | POA: Diagnosis not present

## 2020-08-11 DIAGNOSIS — K219 Gastro-esophageal reflux disease without esophagitis: Secondary | ICD-10-CM | POA: Diagnosis not present

## 2020-08-11 DIAGNOSIS — I5042 Chronic combined systolic (congestive) and diastolic (congestive) heart failure: Secondary | ICD-10-CM | POA: Diagnosis not present

## 2020-08-11 DIAGNOSIS — N1832 Chronic kidney disease, stage 3b: Secondary | ICD-10-CM

## 2020-08-11 DIAGNOSIS — I480 Paroxysmal atrial fibrillation: Secondary | ICD-10-CM

## 2020-08-11 DIAGNOSIS — F32A Depression, unspecified: Secondary | ICD-10-CM

## 2020-08-11 NOTE — Telephone Encounter (Signed)
Thank you for providing samples. Her Eliquis is likely more expensive as she is in the "donut hole" with Medicare and this will reset in January. The pharmacist (Catie) from her primary care office will be reaching out to her likely early next week to ensure we get it more affordably or transition to alternative agent if needed.  Per my conversation with [facility] this morning over the last few months for her Eliquis she has paid: July $47, August $0, September $0, October $0, November $485.55, December $253.15.   Loel Dubonnet, NP

## 2020-08-11 NOTE — Progress Notes (Signed)
Virtual Visit via telephone Note  This visit type was conducted due to national recommendations for restrictions regarding the COVID-19 pandemic (e.g. social distancing).  This format is felt to be most appropriate for this patient at this time.  All issues noted in this document were discussed and addressed.  No physical exam was performed (except for noted visual exam findings with Video Visits).   I connected with Dawn Sherman by telephone and verified that I am speaking with the correct person using two identifiers. Location patient: home Location provider: work  Persons participating in the telephone visit: patient, provider  The limitations, risks, security and privacy concerns of performing an evaluation and management service by telephone and the availability of in person appointments have been discussed. It has also been discussed with the patient that there may be a patient responsible charge related to this service. The patient expressed understanding and agreed to proceed.   Reason for visit: scheduled follow up.   HPI: Follow up regarding blood pressure, cholesterol and depression.  Seeing cardiology for f/u regarindg her history of heart failure and afib.  Doing well.  Just had echo.  Heart function doing well.  No chest pain or sob.  No acid reflux.  No abdominal pain.  Bowels moving.  Staying active.  Handling stress.  Informed me she could not afford eliquis.  Only taking one per day  Picking up samples from cardiology today.     ROS: See pertinent positives and negatives per HPI.  Past Medical History:  Diagnosis Date  . B12 deficiency   . CHF (congestive heart failure) ([place])   . CKD (chronic kidney disease), stage III ([place])   . Depression   . Endometriosis   . Hypertension   . Mixed hyperlipidemia   . Tobacco abuse     Past Surgical History:  Procedure Laterality Date  . ABDOMINAL HYSTERECTOMY    . BREAST BIOPSY Right 2015   neg-  FIBROADENOMA  . TAH/RSO   1999   secondary to bleeding and endometriosis (Dr Vernie Ammons)    Family History  Problem Relation Age of Onset  . Hypercholesterolemia Mother   . Breast cancer Neg Hx   . Colon cancer Neg Hx     SOCIAL HX: reviewed.    Current Outpatient Medications:  .  acetaminophen (TYLENOL) 325 MG tablet, Take 650 mg by mouth every 6 (six) hours as needed., Disp: , Rfl:  .  amLODipine (NORVASC) 5 MG tablet, TAKE 1 TABLET BY MOUTH EVERY DAY, Disp: 90 tablet, Rfl: 1 .  apixaban (ELIQUIS) 2.5 MG TABS tablet, Take 1 tablet (2.5 mg total) by mouth 2 (two) times daily., Disp: 60 tablet, Rfl: 11 .  atorvastatin (LIPITOR) 40 MG tablet, Take 1 tablet (40 mg total) by mouth daily., Disp: 90 tablet, Rfl: 1 .  buPROPion (WELLBUTRIN XL) 150 MG 24 hr tablet, TAKE 1 TABLET BY MOUTH EVERY DAY (Patient taking differently: Take 150 mg by mouth daily. TAKE 1 TABLET BY MOUTH EVERY DAY), Disp: 90 tablet, Rfl: 1 .  carvedilol (COREG) 25 MG tablet, Take 1 tablet (25 mg total) by mouth 2 (two) times daily., Disp: 180 tablet, Rfl: 3 .  furosemide (LASIX) 20 MG tablet, Take 1 tablet (20 mg total) by mouth daily. (Patient taking differently: Take 10 mg by mouth daily.), Disp: 90 tablet, Rfl: 3 .  loratadine (CLARITIN) 10 MG tablet, Take 10 mg by mouth daily., Disp: , Rfl:  .  omeprazole (PRILOSEC) 20 MG capsule, Take 20  mg by mouth daily., Disp: , Rfl:  .  sacubitril-valsartan (ENTRESTO) 24-26 MG, Take 1 tablet by mouth 2 (two) times daily., Disp: 180 tablet, Rfl: 2 .  venlafaxine XR (EFFEXOR-XR) 150 MG 24 hr capsule, Take 150 mg by mouth daily., Disp: , Rfl:   EXAM:  GENERAL: alert. Sounds to be in no acute distress.  Answering questions appropriately.    PSYCH/NEURO: pleasant and cooperative, no obvious depression or anxiety, speech and thought processing grossly intact  ASSESSMENT AND PLAN:  Discussed the following assessment and plan:  Problem List Items Addressed This Visit    Hypertension    Continue entrestro  and coreg.  Follow pressures.  Follow metabolic panel.       Relevant Orders   AMB Referral to [facility]   Mild depression [facility])    Doing well on wellbutrin and effexor.  Follow.        Hypercholesterolemia    On lipitor.  Low cholesterol diet and exercise.  Follow lipid panel and liver function tests.        Relevant Orders   AMB Referral to [facility]   Chronic combined systolic and diastolic CHF (congestive heart failure) ([place])    Continue entrestro and coreg.  No sob.  Sees cardiology.  Follow.        Relevant Orders   AMB Referral to [facility]   GERD (gastroesophageal reflux disease)    No upper symptoms reported. On prilosec.       CKD (chronic kidney disease) stage 3, GFR 30-59 ml/min (HCC)    Followed by nephrology.  Avoid antiinflammatories.        Paroxysmal A-fib (H[facility])    Documented paroxysmal afib - started on eliquis.  Having trouble affording the medication.  Discussed importance of taking her medication.  CCM referral.           I discussed the assessment and treatment plan with the patient. The patient was provided an opportunity to ask questions and all were answered. The patient agreed with the plan and demonstrated an understanding of the instructions.   The patient was advised to call back or seek an in-person evaluation if the symptoms worsen or if the condition fails to improve as anticipated.  I provided 23 minutes of non-face-to-face time during this encounter.   [name], MD

## 2020-08-12 ENCOUNTER — Encounter: Payer: Self-pay | Admitting: Internal Medicine

## 2020-08-12 DIAGNOSIS — I480 Paroxysmal atrial fibrillation: Secondary | ICD-10-CM | POA: Insufficient documentation

## 2020-08-12 DIAGNOSIS — N183 Chronic kidney disease, stage 3 unspecified: Secondary | ICD-10-CM | POA: Insufficient documentation

## 2020-08-12 NOTE — Assessment & Plan Note (Signed)
Continue entrestro and coreg.  Follow pressures.  Follow metabolic panel.

## 2020-08-12 NOTE — Assessment & Plan Note (Signed)
Followed by nephrology.  Avoid antiinflammatories.

## 2020-08-12 NOTE — Assessment & Plan Note (Signed)
Continue entrestro and coreg.  No sob.  Sees cardiology.  Follow.

## 2020-08-12 NOTE — Assessment & Plan Note (Signed)
On lipitor.  Low cholesterol diet and exercise.  Follow lipid panel and liver function tests.   

## 2020-08-12 NOTE — Assessment & Plan Note (Signed)
Documented paroxysmal afib - started on eliquis.  Having trouble affording the medication.  Discussed importance of taking her medication.  CCM referral.

## 2020-08-12 NOTE — Assessment & Plan Note (Signed)
No upper symptoms reported. On prilosec.  

## 2020-08-12 NOTE — Assessment & Plan Note (Signed)
Doing well on wellbutrin and effexor.  Follow.

## 2020-08-14 ENCOUNTER — Telehealth: Payer: Self-pay | Admitting: Internal Medicine

## 2020-08-14 NOTE — Telephone Encounter (Signed)
Returned call. Scheduled for appt tomorrow

## 2020-08-14 NOTE — Telephone Encounter (Signed)
Patient called in stated that she was returning Catie call

## 2020-08-15 ENCOUNTER — Ambulatory Visit: Payer: BC Managed Care – PPO | Admitting: Pharmacist

## 2020-08-15 ENCOUNTER — Other Ambulatory Visit: Payer: Self-pay | Admitting: Family

## 2020-08-15 DIAGNOSIS — I480 Paroxysmal atrial fibrillation: Secondary | ICD-10-CM

## 2020-08-15 DIAGNOSIS — I10 Essential (primary) hypertension: Secondary | ICD-10-CM

## 2020-08-15 DIAGNOSIS — N1832 Chronic kidney disease, stage 3b: Secondary | ICD-10-CM

## 2020-08-15 DIAGNOSIS — E875 Hyperkalemia: Secondary | ICD-10-CM

## 2020-08-15 DIAGNOSIS — E7800 Pure hypercholesterolemia, unspecified: Secondary | ICD-10-CM

## 2020-08-15 DIAGNOSIS — I5042 Chronic combined systolic (congestive) and diastolic (congestive) heart failure: Secondary | ICD-10-CM

## 2020-08-15 NOTE — Progress Notes (Signed)
Due for repeat potassium after recent ED visit for hyperkalemia. Order placed for BMP and magnesium at [facility].   Dawn Dubonnet, NP

## 2020-08-15 NOTE — Chronic Care Management (AMB) (Signed)
Chronic Care Management   Pharmacy Note  08/15/2020 Name: Dawn Sherman MRN: [id number] DOB: [birth year]  Subjective:  Dawn Sherman is a 64 y.o. year old female who is a primary care patient of [name], MD. The CCM team was consulted for assistance with chronic disease management and care coordination needs.    Engaged with patient by telephone for follow up visit in response to provider referral for pharmacy case management and/or care coordination services.   Consent to Services:  Dawn Sherman was given the following information about Chronic Care Management services today, agreed to services, and gave verbal consent: 1. CCM service includes personalized support from designated clinical staff supervised by her physician, including individualized plan of care and coordination with other care providers 2. 24/7 contact phone numbers for assistance for urgent and routine care needs. 3. Service will only be billed when office clinical staff spend 20 minutes or more in a month to coordinate care. 4. Only one practitioner may furnish and bill the service in a calendar month. 5.The patient may stop CCM services at any time (effective at the end of the month) by phone call to the office staff. 6. The patient will be responsible for cost sharing (co-pay) of up to 20% of the service fee (after annual deductible is met). Patient agreed to services and consent obtained.  Objective:  Lab Results  Component Value Date   CREATININE 1.57 (H) 08/04/2020   CREATININE 1.57 (H) 08/04/2020   CREATININE 2.22 (H) 07/27/2020    Lab Results  Component Value Date   HGBA1C 5.7 (H) 06/17/2020       Component Value Date/Time   CHOL 125 06/17/2020 0424   TRIG 65 06/17/2020 0424   HDL 63 06/17/2020 0424   CHOLHDL 2.0 06/17/2020 0424   VLDL 13 06/17/2020 0424   LDLCALC 49 06/17/2020 0424   LDLDIRECT 53.3 10/14/2012 0821    CHA2DS2-VASc Score = 4  This indicates a 4.8% annual risk of stroke. The  patient's score is based upon: CHF History: Yes HTN History: Yes Diabetes History: No Stroke History: No Vascular Disease History: No Age Score: 1 Gender Score: 1   BP Readings from Last 3 Encounters:  08/11/20 134/70  08/04/20 (!) 155/98  06/28/20 138/81    Assessment/Interventions: Review of patient past medical history, allergies, medications, health status, including review of consultants reports, laboratory and other test data, was performed as part of comprehensive evaluation and provision of chronic care management services.   SDOH (Social Determinants of Health) assessments and interventions performed:  SDOH Interventions   Flowsheet Row Most Recent Value  SDOH Interventions   Financial Strain Interventions Other (Comment)  [manufacturer assistance,  counseling on principals of Medicare coverage]       CCM Care Plan  Allergies  Allergen Reactions   Amoxicillin    Benicar [Olmesartan]     Talked with patient February 10, 2020, intolerance is unclear, tried several medications around that time and one of them gave her a rash but she is not clear which 1.   Codeine Sulfate    Morphine And Related    Penicillins    Sulfate    Clindamycin/Lincomycin Rash    Medications Reviewed Today    Reviewed by De Hollingshead, RPH-CPP (Pharmacist) on 08/15/20 at 0955  Med List Status: <None>  Medication Order Taking? Sig Documenting Provider Last Dose Status Informant  acetaminophen (TYLENOL) 325 MG tablet [id number]  Take 650 mg by mouth every 6 (six) hours as  needed. [provider]  Active Self  amLODipine (NORVASC) 5 MG tablet [id number] Yes TAKE 1 TABLET BY MOUTH EVERY DAY [name], MD Taking Active   apixaban (ELIQUIS) 2.5 MG TABS tablet [id number] Yes Take 1 tablet (2.5 mg total) by mouth 2 (two) times daily. Loel Dubonnet, NP Taking Active Self           Med Note De Hollingshead   Tue Aug 15, 2020  9:53 AM) Taking once daily  atorvastatin  (LIPITOR) 40 MG tablet [id number] Yes Take 1 tablet (40 mg total) by mouth daily. Rai, Vernelle Emerald, MD Taking Active Self  buPROPion (WELLBUTRIN XL) 150 MG 24 hr tablet [id number] Yes TAKE 1 TABLET BY MOUTH EVERY DAY [name], MD Taking Active Self  carvedilol (COREG) 25 MG tablet [id number] Yes Take 1 tablet (25 mg total) by mouth 2 (two) times daily. Loel Dubonnet, NP Taking Active            Med Note De Hollingshead   Tue Aug 15, 2020  9:54 AM) Only taking QAM  furosemide (LASIX) 20 MG tablet [id number] Yes Take 1 tablet (20 mg total) by mouth daily.  Patient taking differently: Take 10 mg by mouth daily.   Loel Dubonnet, NP Taking Active   loratadine (CLARITIN) 10 MG tablet [id number] Yes Take 10 mg by mouth daily. [provider] Taking Active Self  omeprazole (PRILOSEC) 20 MG capsule [id number] Yes Take 20 mg by mouth daily. [provider] Taking Active Self  sacubitril-valsartan (ENTRESTO) 24-26 MG [id number] Yes Take 1 tablet by mouth 2 (two) times daily. Erma Heritage, PA-C Taking Active Self  venlafaxine XR (EFFEXOR-XR) 150 MG 24 hr capsule [id number] Yes Take 150 mg by mouth daily. 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[place])    Nausea vomiting and diarrhea 06/16/2020   Hypoglycemia 06/16/2020   Chronic combined systolic and diastolic CHF (congestive heart failure) ([place]) 02/10/2020   Leukocytosis 02/10/2020   HLD (hyperlipidemia) 02/10/2020   Elevated troponin 02/10/2020   GERD (gastroesophageal reflux disease) 02/10/2020   Acute respiratory failure with hypoxia ([place]) 02/10/2020   Abnormal mammogram 07/13/2019   Sleep concern 10/17/2018   History of alcohol abuse  07/31/2018   B12 deficiency 07/31/2018   Acute renal failure superimposed on stage 3b chronic kidney disease ([place]) 11/03/2016   Health care maintenance 12/02/2014   Tobacco abuse 12/02/2014   Foot pain, left 11/29/2014   Hypercholesterolemia 11/29/2014   Encounter for screening colonoscopy 12/16/2013   Lump or mass in breast 12/16/2013   Breast mass 12/15/2012   Hypertension 07/05/2012   Mild depression ([place]) 07/05/2012    Conditions to be addressed/monitored per PCP order: Atrial Fibrillation, CHF, HTN and HLD  Patient Care Plan: Medication Management    Problem Identified: Atrial Fibrillation, Heart Failure, Prediabetes, HLD, HTN     Long-Range Goal: Disease Progression Prevention   This Visit's Progress: On track  Priority: High  Note:   Current Barriers:   Unable to independently afford treatment regimen  Does not adhere to prescribed medication regimen  Pharmacist Clinical Goal(s):   Over the next 90 days, patient will verbalize ability to afford treatment regimen  Over the  next 90 days, patient will  achieve adherence to monitoring guidelines and medication adherence to achieve therapeutic efficacy  Interventions:  1:1 collaboration with [name], MD regarding development and update of comprehensive plan of care as evidenced by provider attestation and co-signature  Inter-disciplinary care team collaboration (see longitudinal plan of care)  Comprehensive medication review performed; medication list updated in electronic medical record  HFpEF (recovered); most recent EF 50-55%:  Appropriately managed; current treatment: Entresto 24/26 mg BID (confirms BID adherence, cost ~ $47), carvedilol 25 mg BID (only taking QAM), furosemide 10 mg daily, amlodipine 5 mg daily    Dicussed importance of taking carvedilol BID. Patient will start taking evening dose   Recent episode of hyperkalemia. Did not receive repeat BMP. Collaborated w/ cardiology to have  BMP + Mag ordered for patient to have collected at [facility] tomorrow.   Discussed income. Patient appears to qualify for patient assistance for 2022 (4 person household size, husband still works, patient on SSI, two grandchildren that are dependents). Will collaborate w/ CPhT to mail patient portion of application and fax provider portion to cardiology.   Atrial Fibrillation:  Appropriately managed; current rate control: carvedilol 25 mg BID (taking QAM); anticoagulant treatment: Eliquis 2.5 mg BID (appropriately renally reduced d/t Scr >1.5, weight <60 kg)  CHADS2VASc score:   Discussed importance of taking BID. 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 Likely there was a prescription deductible she had to pay through. She is unsure if she has a deductible for 2022.   Patient is over income for patient assistance (4 person household size, husband still works, patient on SSI, two grandchildren that are dependents). Will continue to follow for cost of Eliquis.   Hyperlipidemia:  Controlled; current treatment: atorvastatin 40 mg daily  Recommended to continue current regimen at this time   Depression/Anxiety:  Moderately well managed per last report to PCP; current treatment: venlafaxine XR 150 mg daily, bupropion XL 150 mg QAM   Will further discuss support and mental health needs at future appointments. Recommended to continue current regimen at this time  GERD, s/p hospitalization for gastroenteritis:  Controlled; current regimen: omeprazole 20 mg daily  Recommended to continue current regimen at this time  Patient Goals/Self-Care Activities  Over the next 30 days, patient will:  - take medications as prescribed collaborate with provider on medication access solutions  Follow Up Plan: Telephone follow up appointment with care management team member scheduled for: ~ 2 weeks       Medication Assistance: Application for [employer] Delene Loll) medication assistance program in process. Anticipated assistance start date TBD. See plan of care above for additional detail. Patient over income for Eliquis assistance  Plan: Telephone follow up appointment with care management team member scheduled for: ~ 2 weeks  Catie Darnelle Maffucci, PharmD, [place], [place] Clinical Pharmacist [employer] at [employer] [phone]

## 2020-08-15 NOTE — Patient Instructions (Addendum)
Dawn Sherman,   It was great talking with you!   Here is how you should be taking your daily medications (as of our call on 08/15/20 at 10 am):   Morning: - Furosemide 10 mg (1/2 of 20 mg tab; fluid/heart/swelling) - Carvedilol 25 mg (heart rate, blood pressure, heart failure and atrial fibrillation) - Entresto 24/26 mg (blood pressure, heart failure) - Eliquis 2.5 mg (stroke prevention due to atrial fibrillation) - Amlodipine 5 mg (blood pressure) - Venlafaxine XR 150 mg (mood) - Bupropion XR 150 mg (mood) - Omeprazole 20 mg (acid reflux)- best to take ~30 minutes before a meal - Atorvastatin 40 mg (stroke/heart attack prevention, cholesterol)  Evening: - Carvedilol 25 mg (heart rate, blood pressure, heart failure and atrial fibrillation) - Entresto 24/26 mg (blood pressure, heart failure) - Eliquis 2.5 mg (stroke prevention due to atrial fibrillation)  My pharmacy technician, Sharee Pimple, will be mailing you a packet of information with the paperwork to complete to apply for Entresto assistance from the [employer], for 2022. You can mail that back to her in the enclosed return envelope OR drop off here at Dr. Bary Leriche office. We will also need:  1) proof of your income (would be most accurate to include your 2020 tax return, but then your husband's most recent [place] and your social security statement for 2022, since you are no longer working) and  2) a copy of your [employer] prescription insurance card.   Please call me with any questions or concerns!  Catie Darnelle Maffucci, PharmD, [place], [place]  Visit Information  Patient Care Plan: Medication Management    Problem Identified: Atrial Fibrillation, Heart Failure, Prediabetes, HLD, HTN     Long-Range Goal: Disease Progression Prevention   This Visit's Progress: On track  Priority: High  Note:   Current Barriers:  . Unable to independently afford treatment regimen . Does not adhere to prescribed medication  regimen  Pharmacist Clinical Goal(s):  Marland Kitchen Over the next 90 days, patient will verbalize ability to afford treatment regimen . Over the next 90 days, patient will  achieve adherence to monitoring guidelines and medication adherence to achieve therapeutic efficacy  Interventions: . 1:1 collaboration with [name], MD regarding development and update of comprehensive plan of care as evidenced by provider attestation and co-signature . Inter-disciplinary care team collaboration (see longitudinal plan of care) . Comprehensive medication review performed; medication list updated in electronic medical record  HFpEF (recovered); most recent EF 50-55%: Marland Kitchen Appropriately managed; current treatment: Entresto 24/26 mg BID (confirms BID adherence, cost ~ $47), carvedilol 25 mg BID (only taking QAM), furosemide 10 mg daily, amlodipine 5 mg daily   . Dicussed importance of taking carvedilol BID. Patient will start taking evening dose  . Recent episode of hyperkalemia. Did not receive repeat BMP. Collaborated w/ cardiology to have BMP + Mag ordered for patient to have collected [facility] tomorrow.  . Discussed income. Patient appears to qualify for patient assistance for 2022 (4 person household size, husband still works, patient on SSI, two grandchildren that are dependents). Will collaborate w/ CPhT to mail patient portion of application and fax provider portion to cardiology.   Atrial Fibrillation: . Appropriately managed; current rate control: carvedilol 25 mg BID (taking QAM); anticoagulant treatment: Eliquis 2.5 mg BID (appropriately renally reduced d/t Scr >1.5, weight <60 kg) . CHADS2VASc score:  Marland Kitchen Discussed importance of taking BID. 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 Likely there was a prescription deductible she had to pay through. She is unsure if she has a deductible for 2022.   Marland Kitchen Patient is over income for patient assistance (4 person household size, husband still works, patient on SSI, two grandchildren that are dependents). Will continue to follow for cost of Eliquis.   Hyperlipidemia: . Controlled; current treatment: atorvastatin 40 mg daily . Recommended to continue current regimen at this time   Depression/Anxiety: . Moderately well managed per last report to PCP; current treatment: venlafaxine XR 150 mg daily, bupropion XL 150 mg QAM  . Will further discuss support and mental health needs at future appointments. Recommended to continue current regimen at this time  GERD, s/p hospitalization for gastroenteritis: . Controlled; current regimen: omeprazole 20 mg daily . Recommended to continue current regimen at this time  Patient Goals/Self-Care Activities . Over the next 30 days, patient will:  - take medications as prescribed collaborate with provider on medication access solutions  Follow Up Plan: Telephone follow up appointment with care management team member scheduled for: ~ 2 weeks      Dawn Sherman was given information about Chronic Care Management services today including:  1. CCM service includes personalized support from designated clinical staff supervised by her physician, including individualized plan of care and coordination with other care providers 2. 24/7 contact phone numbers for assistance for urgent and routine care needs. 3. Service will only be billed when office clinical staff spend 20 minutes or more in a month to coordinate care. 4. Only one practitioner may furnish and bill the service in a calendar month. 5. The patient may stop CCM services at any time (effective at the end of the month) by phone call to the office staff. 6. The patient will be responsible for cost sharing (co-pay) of up to 20% of the service fee (after annual deductible is met).  Patient agreed to services and verbal consent obtained.   The patient  verbalized understanding of instructions, educational materials, and care plan provided today and agreed to receive a mailed copy of patient instructions, educational materials, and care plan.    Plan: Telephone follow up appointment with care management team member scheduled for: ~ 2 weeks  Catie Darnelle Maffucci, PharmD, [place], [place] Clinical Pharmacist [employer] at [employer] [phone]

## 2020-08-16 ENCOUNTER — Telehealth: Payer: Self-pay | Admitting: *Deleted

## 2020-08-16 ENCOUNTER — Other Ambulatory Visit
Admission: RE | Admit: 2020-08-16 | Discharge: 2020-08-16 | Disposition: A | Discharge status: Home / Self Care | Payer: BC Managed Care – PPO | Attending: Family | Admitting: Family

## 2020-08-16 DIAGNOSIS — Z79899 Other long term (current) drug therapy: Secondary | ICD-10-CM

## 2020-08-16 DIAGNOSIS — I5042 Chronic combined systolic (congestive) and diastolic (congestive) heart failure: Secondary | ICD-10-CM

## 2020-08-16 DIAGNOSIS — E875 Hyperkalemia: Secondary | ICD-10-CM | POA: Insufficient documentation

## 2020-08-16 LAB — BASIC METABOLIC PANEL
Anion gap: 9 (ref 5–15)
BUN: 22 mg/dL (ref 8–23)
CO2: 22 mmol/L (ref 22–32)
Calcium: 9 mg/dL (ref 8.9–10.3)
Chloride: 99 mmol/L (ref 98–111)
Creatinine, Ser: 1.48 mg/dL — ABNORMAL HIGH (ref 0.44–1.00)
GFR, Estimated: 39 mL/min — ABNORMAL LOW (ref >60)
Glucose, Bld: 108 mg/dL — ABNORMAL HIGH (ref 70–99)
Potassium: 5.4 mmol/L — ABNORMAL HIGH (ref 3.5–5.1)
Sodium: 130 mmol/L — ABNORMAL LOW (ref 135–145)

## 2020-08-16 LAB — MAGNESIUM: Magnesium: 1.9 mg/dL (ref 1.7–2.4)

## 2020-08-16 NOTE — Telephone Encounter (Signed)
-----   Message from Loel Dubonnet, NP sent at 08/16/2020  2:13 PM EST ----- Normal magnesium. Kidney function improving. Sodium remains mildly low. Potassium remains mildly elevated - recommend stopping Entresto as this can cause elevated potassium and her EF is now normal. [facility].   Please confirm she is not taking Lasix (she has been asked to hold) and remove from med list.

## 2020-08-16 NOTE — Telephone Encounter (Signed)
Spoke to pt, notified of lab results and recc below. Pt did confirm that she has NOT been taking Lasix. Removed from med list. Pt also verbalizes understanding to stop Entresto, also removed from med list, and pt will have repeat Bmet Monday or [facility]. Lab orders placed. Pt has no further questions at this time.

## 2020-08-21 ENCOUNTER — Telehealth: Payer: Self-pay | Admitting: Internal Medicine

## 2020-08-21 MED ORDER — ATORVASTATIN CALCIUM 40 MG PO TABS: 40.0000 mg | ORAL_TABLET | Freq: Every day | ORAL | 1 refills | Status: DC

## 2020-08-21 NOTE — Telephone Encounter (Signed)
Patient called in for refill for atorvastatin (LIPITOR) 40 MG tablet she is out of it pharmacy faxed over refill request

## 2020-08-24 ENCOUNTER — Other Ambulatory Visit: Payer: Self-pay

## 2020-08-24 ENCOUNTER — Telehealth: Payer: Self-pay | Admitting: Pharmacist

## 2020-08-24 ENCOUNTER — Other Ambulatory Visit
Admission: RE | Admit: 2020-08-24 | Discharge: 2020-08-24 | Disposition: A | Discharge status: Home / Self Care | Payer: BC Managed Care – PPO | Attending: Family | Admitting: Family

## 2020-08-24 DIAGNOSIS — Z79899 Other long term (current) drug therapy: Secondary | ICD-10-CM | POA: Insufficient documentation

## 2020-08-24 DIAGNOSIS — I5042 Chronic combined systolic (congestive) and diastolic (congestive) heart failure: Secondary | ICD-10-CM | POA: Diagnosis present

## 2020-08-24 LAB — BASIC METABOLIC PANEL
Anion gap: 9 (ref 5–15)
BUN: 22 mg/dL (ref 8–23)
CO2: 20 mmol/L — ABNORMAL LOW (ref 22–32)
Calcium: 9 mg/dL (ref 8.9–10.3)
Chloride: 103 mmol/L (ref 98–111)
Creatinine, Ser: 1.63 mg/dL — ABNORMAL HIGH (ref 0.44–1.00)
GFR, Estimated: 35 mL/min — ABNORMAL LOW (ref >60)
Glucose, Bld: 98 mg/dL (ref 70–99)
Potassium: 5.1 mmol/L (ref 3.5–5.1)
Sodium: 132 mmol/L — ABNORMAL LOW (ref 135–145)

## 2020-08-24 NOTE — Telephone Encounter (Signed)
°  Chronic Care Management   Note  08/24/2020 Name: Dawn Sherman MRN: [id number] DOB: 11-Oct-1955   Patient dropped off Eliquis patient assistance. Reviewed that [employer] stopped accepting [id number] applications on 93/55/21 and that she has to meet the 3% of total income out of pocket spend requirement in 2022 before we can apply for assistance in the new year. Called patient to review this. She verbalized understanding.   Catie Darnelle Maffucci, PharmD, [place], [place] Clinical Pharmacist [employer] at [place]

## 2020-08-27 NOTE — Progress Notes (Deleted)
Virtual Visit via Telephone Note   This visit type was conducted due to national recommendations for restrictions regarding the COVID-19 Pandemic (e.g. social distancing) in an effort to limit this patient's exposure and mitigate transmission in our community.  Due to her co-morbid illnesses, this patient is at least at moderate risk for complications without adequate follow up.  This format is felt to be most appropriate for this patient at this time.  The patient did not have access to video technology/had technical difficulties with video requiring transitioning to audio format only (telephone).  All issues noted in this document were discussed and addressed.  No physical exam could be performed with this format.  Please refer to the patient's chart for her  consent to telehealth for [facility].   I connected with  Dawn Sherman on 08/27/20 by a video enabled telemedicine application and verified that I am speaking with the correct person using two identifiers. I am contacting the patient above from our cardiology clinic office or alternate office work station to their home, I discussed the limitations of evaluation and management by telemedicine. The patient expressed understanding and agreed to proceed.   Evaluation Performed:  Follow-up visit  Date:  08/27/2020   ID:  Dawn Sherman, DOB January 28, 1956, MRN [id number]  Patient Location:  [place] [id number]   Provider location:   [facility], [place] office  PCP:  [name], MD  Cardiologist:  [facility]   No chief complaint on file.     History of Present Illness:    Dawn Sherman is a 65 y.o. female who presents via audio/video conferencing for a telehealth visit today.   The patient does not symptoms concerning for COVID-19 infection (fever, chills, cough, or new SHORTNESS OF BREATH).   Patient has a past medical history of smoker, prior alcohol,  chronic kidney disease,   hypertension,  hospital June 2021 with worsening PND orthopnea, congestive heart failure symptoms, " panic attack" Smoker Chronic kidney disease Who presents for follow-up of her diastolic and systolic CHF, PAF   hospital June 2021 Echocardiogram confirming ejection fraction 30%, global Unable to exclude hypertensive cardiomyopath Paroxysmal atrial fibrillation  pleural effusion on CT scan Minimal coronary calcification or aortic atherosclerosis Declined catheterization, had Myoview showing no ischemia She was started on carvedilol, Entresto, Lasix Eliquis Was not discharged on Eliquis  Seen in clinic by one of our providers March 24, 2020 Was restarted on Entresto  Creatinine 1.59 in August 2021  BP at home 145/67, Pulse 83  No change in leg edema, active, does ADLs Denies having any symptoms on the Entresto Has not had any blood work since medication was started Denies significant shortness of breath on exertion    Past Medical History:  Diagnosis Date  . B12 deficiency   . CHF (congestive heart failure) ([place])   . CKD (chronic kidney disease), stage III ([place])   . Depression   . Endometriosis   . Hypertension   . Mixed hyperlipidemia   . Tobacco abuse    Past Surgical History:  Procedure Laterality Date  . ABDOMINAL HYSTERECTOMY    . BREAST BIOPSY Right 2015   neg-  FIBROADENOMA  . TAH/RSO  1999   secondary to bleeding and endometriosis (Dr Vernie Ammons)      Allergies:   Amoxicillin, Benicar [olmesartan], Codeine sulfate, Morphine and related, Penicillins, Sulfate, and Clindamycin/lincomycin   Social History   Tobacco Use  .  Smoking status: Current Every Day Smoker    Packs/day: 0.50    Years: 40.00    Pack years: 20.00    Types: Cigarettes  . Smokeless tobacco: Never Used  Vaping Use  . Vaping Use: Never used  Substance Use Topics  . Alcohol use: Not Currently    Alcohol/week: 0.0 standard drinks    Comment: occasional  . Drug use: No      Current Outpatient Medications on File Prior to Visit  Medication Sig Dispense Refill  . acetaminophen (TYLENOL) 325 MG tablet Take 650 mg by mouth every 6 (six) hours as needed.    Marland Kitchen amLODipine (NORVASC) 5 MG tablet TAKE 1 TABLET BY MOUTH EVERY DAY 90 tablet 1  . apixaban (ELIQUIS) 2.5 MG TABS tablet Take 1 tablet (2.5 mg total) by mouth 2 (two) times daily. 60 tablet 11  . atorvastatin (LIPITOR) 40 MG tablet Take 1 tablet (40 mg total) by mouth daily. 90 tablet 1  . buPROPion (WELLBUTRIN XL) 150 MG 24 hr tablet TAKE 1 TABLET BY MOUTH EVERY DAY 90 tablet 1  . carvedilol (COREG) 25 MG tablet Take 1 tablet (25 mg total) by mouth 2 (two) times daily. 180 tablet 3  . loratadine (CLARITIN) 10 MG tablet Take 10 mg by mouth daily.    Marland Kitchen omeprazole (PRILOSEC) 20 MG capsule Take 20 mg by mouth daily.    Marland Kitchen venlafaxine XR (EFFEXOR-XR) 150 MG 24 hr capsule Take 150 mg by mouth daily.     No current facility-administered medications on file prior to visit.     Family Hx: The patient's family history includes Hypercholesterolemia in her mother. There is no history of Breast cancer or Colon cancer.  ROS:   Please see the history of present illness.    Review of Systems  Constitutional: Negative.   HENT: Negative.   Respiratory: Negative.   Cardiovascular: Negative.   Gastrointestinal: Negative.   Musculoskeletal: Negative.   Neurological: Negative.   Psychiatric/Behavioral: Negative.   All other systems reviewed and are negative.    Labs/Other Tests and Data Reviewed:    Recent Labs: 06/15/2020: ALT 12 06/16/2020: B Natriuretic Peptide 115.6 08/04/2020: Hemoglobin 12.2; Platelets 206 08/16/2020: Magnesium 1.9 08/24/2020: BUN 22; Creatinine, Ser 1.63; Potassium 5.1; Sodium 132   Recent Lipid Panel Lab Results  Component Value Date/Time   CHOL 125 06/17/2020 04:24 AM   TRIG 65 06/17/2020 04:24 AM   HDL 63 06/17/2020 04:24 AM   CHOLHDL 2.0 06/17/2020 04:24 AM   LDLCALC 49 06/17/2020  04:24 AM   LDLDIRECT 53.3 10/14/2012 08:21 AM    Wt Readings from Last 3 Encounters:  08/11/20 118 lb (53.5 kg)  08/04/20 118 lb (53.5 kg)  06/28/20 120 lb (54.4 kg)     Exam:    Vital Signs: Vital signs may also be detailed in the HPI There were no vitals taken for this visit.  Wt Readings from Last 3 Encounters:  08/11/20 118 lb (53.5 kg)  08/04/20 118 lb (53.5 kg)  06/28/20 120 lb (54.4 kg)   Temp Readings from Last 3 Encounters:  08/04/20 98.9 F (37.2 C) (Oral)  06/19/20 98.6 F (37 C) (Oral)  04/11/20 97.8 F (36.6 C) (Oral)   BP Readings from Last 3 Encounters:  08/11/20 134/70  08/04/20 (!) 155/98  06/28/20 138/81   Pulse Readings from Last 3 Encounters:  08/04/20 81  06/28/20 93  06/26/20 92      no acute distress. Constitutional:  oriented to person, place, and  time. No distress.    ASSESSMENT & PLAN:    Problem List Items Addressed This Visit   None    Dilated cardiomyopathy Presumed to be nonischemic, possibly hypertensive heart disease given chronic hypertension Reports he is tolerating low-dose Entresto, also on carvedilol 12.5 twice daily Lasix daily We have ordered BMP today given she has started  Entresto 6 weeks ago Recommended she increase carvedilol up to 18.75 mg twice daily Continue Lasix at current dose --Depending on blood pressure and heart rate, and renal function would look to either increase the Entresto or increase carvedilol up to 25 twice daily For worsening renal function, electrolyte issues or bradycardia, may need to start hydralazine or isosorbide We will avoid spironolactone in the setting of renal failure and Entresto  Paroxysmal atrial fibrillation Continue low-dose Eliquis, increase carvedilol   COVID-19 Education: The signs and symptoms of COVID-19 were discussed with the patient and how to seek care for testing (follow up with PCP or arrange E-visit).  The importance of social distancing was discussed  today.  Patient Risk:   After full review of this patients clinical status, I feel that they are at least moderate risk at this time.  Time:   Today, I have spent 25 minutes with the patient with telehealth technology discussing the cardiac and medical problems/diagnoses detailed above   Additional 10 min spent reviewing the chart prior to patient visit today   Medication Adjustments/Labs and Tests Ordered: Current medicines are reviewed at length with the patient today.  Concerns regarding medicines are outlined above.   Tests Ordered: No tests ordered   Medication Changes: No changes made   Disposition: Follow-up in 12 months   Signed, Ida Rogue, MD  [place] [facility] [address] [place] [address], [place], [place] [address]

## 2020-08-28 ENCOUNTER — Ambulatory Visit: Payer: Medicare Other | Admitting: Cardiovascular Disease

## 2020-08-28 DIAGNOSIS — N1832 Chronic kidney disease, stage 3b: Secondary | ICD-10-CM

## 2020-08-28 DIAGNOSIS — I10 Essential (primary) hypertension: Secondary | ICD-10-CM

## 2020-08-28 DIAGNOSIS — I5042 Chronic combined systolic (congestive) and diastolic (congestive) heart failure: Secondary | ICD-10-CM

## 2020-08-28 DIAGNOSIS — Z720 Tobacco use: Secondary | ICD-10-CM

## 2020-08-28 DIAGNOSIS — J449 Chronic obstructive pulmonary disease, unspecified: Secondary | ICD-10-CM

## 2020-08-31 ENCOUNTER — Ambulatory Visit: Payer: Medicare Other | Admitting: Pharmacist

## 2020-08-31 DIAGNOSIS — I5042 Chronic combined systolic (congestive) and diastolic (congestive) heart failure: Secondary | ICD-10-CM

## 2020-08-31 DIAGNOSIS — F320 Major depressive disorder, single episode, mild: Secondary | ICD-10-CM

## 2020-08-31 DIAGNOSIS — I480 Paroxysmal atrial fibrillation: Secondary | ICD-10-CM

## 2020-08-31 DIAGNOSIS — Z720 Tobacco use: Secondary | ICD-10-CM

## 2020-08-31 DIAGNOSIS — F32A Depression, unspecified: Secondary | ICD-10-CM

## 2020-08-31 DIAGNOSIS — E7800 Pure hypercholesterolemia, unspecified: Secondary | ICD-10-CM

## 2020-08-31 DIAGNOSIS — N1832 Chronic kidney disease, stage 3b: Secondary | ICD-10-CM

## 2020-08-31 NOTE — Patient Instructions (Signed)
[name],   It was great talking with you today!  Call me if you have any issues affording Eliquis.   Really contemplate if you want to work on quitting smoking. I'd be happy to help discuss medication therapy that can help and follow up with you about it.   Take care!  Catie Darnelle Maffucci, PharmD [phone]   Visit Information  Patient Care Plan: Medication Management    Problem Identified: Atrial Fibrillation, Heart Failure, Prediabetes, HLD, HTN     Long-Range Goal: Disease Progression Prevention   Recent Progress: On track  Priority: High  Note:   Current Barriers:  . Unable to independently afford treatment regimen . Does not adhere to prescribed medication regimen  Pharmacist Clinical Goal(s):  Marland Kitchen Over the next 90 days, patient will verbalize ability to afford treatment regimen . Over the next 90 days, patient will  achieve adherence to monitoring guidelines and medication adherence to achieve therapeutic efficacy  Interventions: . 1:1 collaboration with Dawn Pheasant, MD regarding development and update of comprehensive plan of care as evidenced by provider attestation and co-signature . Inter-disciplinary care team collaboration (see longitudinal plan of care) . Comprehensive medication review performed; medication list updated in electronic medical record  HFpEF (recovered); most recent EF 50-55%: Marland Kitchen Appropriately managed; current treatment: carvedilol 25 mg BID (confirms she has been taking BID since our last call), furosemide 10 mg daily, amlodipine 5 mg daily; confirms she discontinued Entresto s/p hyperkalemia . Home readings: 136/82, HR 92 this morning. Did not sit and rest for 5 minutes before checking.   . Reviewed upcoming cardiology appointment.  . Encouraged continued medication adherence.   Atrial Fibrillation: . Appropriately managed; current rate control: carvedilol 25 mg BID (taking QAM); anticoagulant treatment: Eliquis 2.5 mg BID (appropriately  renally reduced d/t Scr >1.5, weight <60 kg) . Patient has not needed to attempt to fill Eliquis on her insurance yet. She will call me if the copay is higher than anticipated and we will determine alternative plans . Continue current regimen at this time  Hyperlipidemia: . Controlled; current treatment: atorvastatin 40 mg daily . No antiplatelet given anticoagulant . Recommended to continue current regimen at this time   Depression/Anxiety: . Moderately well managed per patient report, still some stress in daily life; current treatment: venlafaxine XR 150 mg daily, bupropion XL 150 mg QAM  . Continue current regimen at this time  GERD, s/p hospitalization for gastroenteritis: . Controlled; current regimen: omeprazole 20 mg daily . Recommended to continue current regimen at this time   Tobacco Abuse: . ~0.25 packs per day; 40 years of use;  Marland Kitchen Previous quit attempts: unsuccessful using nicotine patches - notes she had a headache. Unsure what dose she was using.  . Triggers to smoke: Stress.  . Motivation to quit smoking: health . Notes that other stress relievers are word puzzles, cooking. Daughter is a Biomedical scientist (went to [employer] school). Discussed to focus on utilizing these other methods of stress relief rather than tobacco . Discussed increased benefit of pharmacotherapy along with behavioral modification, discussed benefit of varenicline vs dual nicotine replacement therapy. Patient will continue to contemplate if she is interested in pharmacotherapy to assist in reduction/elimination of tobacco   Patient Goals/Self-Care Activities . Over the next 90 days, patient will:  - focus on medication adherence by using BID pill box - Commit to reduction of tobacco and assess readiness to quit  Follow Up Plan: Telephone follow up appointment with care management team member scheduled for: ~ 6  weeks       The patient verbalized understanding of instructions, educational materials, and care  plan provided today and agreed to receive a mailed copy of patient instructions, educational materials, and care plan.   Plan: Telephone follow up appointment with care management team member scheduled for:~ 6 weeks  Catie Darnelle Maffucci, PharmD, [place], [place] Clinical Pharmacist [employer] [facility] [employer] [phone]

## 2020-08-31 NOTE — Chronic Care Management (AMB) (Signed)
Chronic Care Management   Pharmacy Note  08/31/2020 Name: Dawn Sherman MRN: [id number] DOB: [birth year]  Subjective:  Dawn Sherman is a 65 y.o. year old female who is a primary care patient of [name], MD. The CCM team was consulted for assistance with chronic disease management and care coordination needs.    Engaged with patient by telephone for follow up visit in response to provider referral for pharmacy case management and/or care coordination services.   Consent to Services:  Patient was given information about Chronic Care Management services, agreed to services, and gave verbal consent prior to initiation of services on 08/15/20. Please see initial visit note for detailed documentation.   Objective:  Lab Results  Component Value Date   CREATININE 1.63 (H) 08/24/2020   CREATININE 1.48 (H) 08/16/2020   CREATININE 1.57 (H) 08/04/2020    Lab Results  Component Value Date   HGBA1C 5.7 (H) 06/17/2020       Component Value Date/Time   CHOL 125 06/17/2020 0424   TRIG 65 06/17/2020 0424   HDL 63 06/17/2020 0424   CHOLHDL 2.0 06/17/2020 0424   VLDL 13 06/17/2020 0424   LDLCALC 49 06/17/2020 0424   LDLDIRECT 53.3 10/14/2012 0821     BP Readings from Last 3 Encounters:  08/11/20 134/70  08/04/20 (!) 155/98  06/28/20 138/81    Assessment/Interventions: Review of patient past medical history, allergies, medications, health status, including review of consultants reports, laboratory and other test data, was performed as part of comprehensive evaluation and provision of chronic care management services.   SDOH (Social Determinants of Health) assessments and interventions performed:  SDOH Interventions   Flowsheet Row Most Recent Value  SDOH Interventions   SDOH Interventions for the Following Domains Tobacco  Financial Strain Interventions --  [will continue to follow as patient fills medications in 2022]  Tobacco Interventions Cessation Materials Given and  Reviewed       CCM Care Plan  Allergies  Allergen Reactions  . Amoxicillin   . Benicar [Olmesartan]     Talked with patient February 10, 2020, intolerance is unclear, tried several medications around that time and one of them gave her a rash but she is not clear which 1.  . Codeine Sulfate   . Morphine And Related   . Penicillins   . Sulfate   . Clindamycin/Lincomycin Rash    Medications Reviewed Today    Reviewed by De Hollingshead, RPH-CPP (Pharmacist) on 08/31/20 at 1113  Med List Status: <None>  Medication Order Taking? Sig Documenting Provider Last Dose Status Informant  acetaminophen (TYLENOL) 325 MG tablet [id number] Yes Take 650 mg by mouth every 6 (six) hours as needed. [provider] Taking Active Self  amLODipine (NORVASC) 5 MG tablet [id number] Yes TAKE 1 TABLET BY MOUTH EVERY DAY [name], MD Taking Active   apixaban (ELIQUIS) 2.5 MG TABS tablet [id number] Yes Take 1 tablet (2.5 mg total) by mouth 2 (two) times daily. Loel Dubonnet, NP Taking Active Self           Med Note Darnelle Maffucci, Maralyn Sago Aug 31, 2020 11:12 AM)    atorvastatin (LIPITOR) 40 MG tablet [id number] Yes Take 1 tablet (40 mg total) by mouth daily. [name], MD Taking Active   buPROPion (WELLBUTRIN XL) 150 MG 24 hr tablet [id number] Yes TAKE 1 TABLET BY MOUTH EVERY DAY [name], MD Taking Active Self  carvedilol (COREG) 25 MG tablet [id number] Yes Take 1 tablet (25  mg total) by mouth 2 (two) times daily. Loel Dubonnet, NP Taking Active            Med Note Darnelle Maffucci, Maralyn Sago Aug 31, 2020 11:13 AM)    loratadine (CLARITIN) 10 MG tablet [id number] Yes Take 10 mg by mouth daily. [provider] Taking Active Self  omeprazole (PRILOSEC) 20 MG capsule [id number] Yes Take 20 mg by mouth daily. [provider] Taking Active Self  venlafaxine XR (EFFEXOR-XR) 150 MG 24 hr capsule [id number] Yes Take 150 mg by mouth daily. [provider] Taking  Active           Patient Active Problem List   Diagnosis Date Noted  . CKD (chronic kidney disease) stage 3, GFR 30-59 ml/min ([facility]) 08/12/2020  . Paroxysmal A-fib ([place]) 08/12/2020  . Nausea and vomiting 06/17/2020  . Dehydration   . 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 Nausea vomiting and diarrhea 06/16/2020  . Hypoglycemia 06/16/2020  . Chronic combined systolic and diastolic CHF (congestive heart failure) ([place]) 02/10/2020  . Leukocytosis 02/10/2020  . HLD (hyperlipidemia) 02/10/2020  . Elevated troponin 02/10/2020  . GERD (gastroesophageal reflux disease) 02/10/2020  . Acute respiratory failure with hypoxia ([place]) 02/10/2020  . Abnormal mammogram 07/13/2019  . Sleep concern 10/17/2018  . History of alcohol abuse 07/31/2018  . B12 deficiency 07/31/2018  . Acute renal failure superimposed on stage 3b chronic kidney disease ([place]) 11/03/2016  . Health care maintenance 12/02/2014  . Tobacco abuse 12/02/2014  . Foot pain, left 11/29/2014  . Hypercholesterolemia 11/29/2014  . Encounter for screening colonoscopy 12/16/2013  . Lump or mass in breast 12/16/2013  . Breast mass 12/15/2012  . Hypertension 07/05/2012  . Mild depression ([place]) 07/05/2012    Conditions to be addressed/monitored: CHF, HTN, HLD, DM and tobacco use  Patient Care Plan: Medication Management    Problem Identified: Atrial Fibrillation, Heart Failure, Prediabetes, HLD, HTN     Long-Range Goal: Disease Progression Prevention   Recent Progress: On track  Priority: High  Note:   Current Barriers:  . Unable to independently afford treatment regimen . Does not adhere to prescribed medication regimen  Pharmacist Clinical Goal(s):  Marland Kitchen Over the next 90 days, patient will verbalize ability to afford treatment regimen . Over the next 90 days, patient will  achieve adherence to monitoring guidelines and medication adherence to achieve therapeutic efficacy  Interventions: . 1:1 collaboration with [name], MD regarding development and update of comprehensive plan of care as evidenced by provider attestation and co-signature . Inter-disciplinary care team collaboration (see longitudinal plan of care) . Comprehensive medication review performed; medication list updated in electronic medical record  HFpEF (recovered); most recent EF 50-55%: Marland Kitchen Appropriately managed; current treatment: carvedilol 25 mg BID (confirms she has been taking BID since our last call), furosemide 10 mg daily, amlodipine 5 mg daily; confirms she discontinued Entresto s/p hyperkalemia . Home readings: 136/82, HR 92 this morning. Did not sit and rest for 5 minutes before checking.   . Reviewed upcoming cardiology appointment.  . Encouraged continued medication adherence.   Atrial Fibrillation: . Appropriately managed; current rate control: carvedilol 25 mg BID (taking QAM); anticoagulant treatment: Eliquis 2.5 mg BID (appropriately renally reduced d/t Scr >1.5, weight <60 kg) . Patient has not needed to attempt to fill Eliquis on her insurance yet. She will call me if the copay is higher than anticipated and we will determine alternative plans . Continue current regimen at this time  Hyperlipidemia: .  Controlled; current treatment: atorvastatin 40 mg daily . No antiplatelet given anticoagulant . Recommended to continue current regimen at this time   Depression/Anxiety: . Moderately well managed per patient report, still some stress in daily life; current treatment: venlafaxine XR 150 mg daily, bupropion XL 150 mg QAM  . Continue current regimen at this time  GERD, s/p hospitalization for gastroenteritis: . Controlled; current regimen: omeprazole 20 mg daily . Recommended to continue current regimen at this time   Tobacco Abuse: . ~0.25 packs per day; 40 years of use;  Marland Kitchen Previous quit attempts: unsuccessful using nicotine patches - notes she had a headache. Unsure what dose she was using.  . Triggers to smoke:  Stress.  . Motivation to quit smoking: health . Notes that other stress relievers are word puzzles, cooking. Daughter is a Biomedical scientist (went to [employer] school). Discussed to focus on utilizing these other methods of stress relief rather than tobacco . Discussed increased benefit of pharmacotherapy along with behavioral modification, discussed benefit of varenicline vs dual nicotine replacement therapy. Patient will continue to contemplate if she is interested in pharmacotherapy to assist in reduction/elimination of tobacco   Patient Goals/Self-Care Activities . Over the next 90 days, patient will:  - focus on medication adherence by using BID pill box - Commit to reduction of tobacco and assess readiness to quit  Follow Up Plan: Telephone follow up appointment with care management team member scheduled for: ~ 6 weeks      Medication Assistance: None required. Patient affirms current coverage meets needs.   Plan: Telephone follow up appointment with care management team member scheduled for:~ 6 weeks  Catie Darnelle Maffucci, PharmD, [place], [place] Clinical Pharmacist [employer] [facility] [employer] [phone]

## 2020-09-04 NOTE — Progress Notes (Signed)
Office Visit    Patient Name: Dawn Sherman Date of Encounter: 09/05/2020  Primary Care Provider:  [name], MD Primary Cardiologist:  Ida Rogue, MD Electrophysiologist:  None   Chief Complaint    Dawn Sherman is a 65 y.o. female with a hx of congestive heart failure, HTN, HLD, tobacco use, CKD 3, prior alcohol use, depression presents today for follow up of heart failure.   Past Medical History    Past Medical History:  Diagnosis Date  . B12 deficiency   . CHF (congestive heart failure) ([place])   . CKD (chronic kidney disease), stage III ([place])   . Depression   . Endometriosis   . Hypertension   . Mixed hyperlipidemia   . Tobacco abuse    Past Surgical History:  Procedure Laterality Date  . ABDOMINAL HYSTERECTOMY    . BREAST BIOPSY Right 2015   neg-  FIBROADENOMA  . TAH/RSO  1999   secondary to bleeding and endometriosis (Dr Dawn Sherman)    Allergies  Allergies  Allergen Reactions  . Amoxicillin   . Benicar [Olmesartan]     Talked with patient February 10, 2020, intolerance is unclear, tried several medications around that time and one of them gave her a rash but she is not clear which 1.  . Codeine Sulfate   . Morphine And Related   . Penicillins   . Sulfate   . Clindamycin/Lincomycin Rash    History of Present Illness    Dawn Sherman is a 65 y.o. female with a hx of  congestive heart failure, HTN, HLD, tobacco use, CKD 3, prior alcohol use, PAF, depression, valvular heart disease, endometriosis, B12 deficiency last seen 06/26/20.   She is retired and enjoys spending time with her grandsons who are 14 and 71 years old and live with her. She lives locally with her husband.  No formal exercise routine.  She was admitted 02/10/20 to [facility] for dyspnea. CXR showed COPD and chronic changes without active disease while CTA chest was negative for PE but showed small right greater than left bilateral pleural effusions, interstitial edema, mild aortic  atherosclerosis.  She was diagnosed with new onset PAF. She declined cardiac catheterization and subsequently had a Lexiscan Myoview showing no significant ischemia.  Echo 02/10/2020 LVEF 30 to 96%, grade 2 diastolic dysfunction, RV normal size and function with mildly elevated PASP, moderate MR, mild to moderate TR..    02/21/20 noted to be mildly hyperkalemic, but with repeat labs stable, encouraged to follow up with her nephrologist Dr. [name]. Her Eliquis had not been started at discharge as recommended and was started in clinic at reduced dose due to weight/renal function. In subsequent labs she had mild renal decline and was recommended to hold her Entresto. Per nephrology her baseline GFR is approximately 42.  Since discontinuation of the Entresto, labs 03/15/2020 show creatinine 1.49, GFR 37, BNP 3567. Her Delene Loll was resumed 03/24/20. In telemedicine 05/03/20 her Coreg was increased to 18.75mg  BID. Recommended to avoid spironolactone.   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 She was treated with IVF. She had AKI likely due to dehydration but her Lasix and Entresto were resumed at discharge.   Echocardiogram 08/09/2020 with LVEF 54% by 3 D volume, no wall motion abnormalities, grade 1 diastolic dysfunction, RV normal size and function, mild MR. Since last seen she has had recurrent difficulties with hyperkalemia with K as high as 6.9 on 08/04/20 and her Delene Loll has had to be discontinued.  Her  Lasix was also discontinued due to elevated creatinine. Most recent lab work 08/24/20 shows creat 1.63, GFR 35, K 5.1.   Presents today for follow-up.  Reports home blood pressure 138/82.  Denies lower extremity edema, dyspnea on exertion.  Reports no chest pain, pressure, tightness.  She reports feeling overall well.  EKGs/Labs/Other Studies Reviewed:   The following studies were reviewed today: Echo 08/09/20 1. Left ventricular ejection fraction, by estimation, is 50 to  55%. Left  ventricular ejection fraction by 3D volume is 54 %. The left ventricle has  low normal function. The left ventricle has no regional wall motion  abnormalities. Left ventricular  diastolic parameters are consistent with Grade I diastolic dysfunction  (impaired relaxation).   2. Right ventricular systolic function is normal. The right ventricular  size is normal.   3. Left atrial size was mildly dilated.   4. The mitral valve is normal in structure. Mild mitral valve  regurgitation.   5. The aortic valve is grossly normal. Aortic valve regurgitation is not  visualized.   6. The inferior vena cava is normal in size with greater than 50%  respiratory variability, suggesting right atrial pressure of 3 mmHg.   Lexiscan Myoview 01/2020 Pharmacological myocardial perfusion imaging study with no significant  Ischemia Thinning in the anterior wall, fixed, likely secondary to processing error and significant GI uptake artifact Global hypokinesis,  EF estimated at 24% No EKG changes concerning for ischemia at peak stress or in recovery. CT attenuation correction images with no significant coronary calcification, no aortic atherosclerosis Moderate risk scan given severely depressed ejection fraction  Echo 02/10/20 1. Left ventricular ejection fraction, by estimation, is 30 to 35%. The  left ventricle has moderately decreased function. The left ventricle  demonstrates global hypokinesis. The left ventricular internal cavity size  was mildly dilated. Left ventricular  diastolic parameters are consistent with Grade II diastolic dysfunction  (pseudonormalization).   2. Right ventricular systolic function is normal. The right ventricular  size is normal. There is mildly elevated pulmonary artery systolic  pressure.   3. The mitral valve is normal in structure. Moderate mitral valve  regurgitation. No evidence of mitral stenosis.   4. Tricuspid valve regurgitation is mild to moderate.    EKG: EKG is ordered today.  EKG performed today shows sinus rhythm 73 bpm with first-degree AV block (PR 220) with occasional PVC.  Stable T wave inversion in lead I, 2.  Stable anterolateral T wave inversion V3 through V6.  Recent Labs: 06/15/2020: ALT 12 06/16/2020: B Natriuretic Peptide 115.6 08/04/2020: Hemoglobin 12.2; Platelets 206 08/16/2020: Magnesium 1.9 08/24/2020: BUN 22; Creatinine, Ser 1.63; Potassium 5.1; Sodium 132  Recent Lipid Panel    Component Value Date/Time   CHOL 125 06/17/2020 0424   TRIG 65 06/17/2020 0424   HDL 63 06/17/2020 0424   CHOLHDL 2.0 06/17/2020 0424   VLDL 13 06/17/2020 0424   LDLCALC 49 06/17/2020 0424   LDLDIRECT 53.3 10/14/2012 0821    Home Medications   Current Meds  Medication Sig  . acetaminophen (TYLENOL) 325 MG tablet Take 650 mg by mouth every 6 (six) hours as needed.  Marland Kitchen amLODipine (NORVASC) 10 MG tablet Take 1 tablet (10 mg total) by mouth daily.  Marland Kitchen apixaban (ELIQUIS) 2.5 MG TABS tablet Take 1 tablet (2.5 mg total) by mouth 2 (two) times daily.  Marland Kitchen atorvastatin (LIPITOR) 40 MG tablet Take 40 mg by mouth daily.  Marland Kitchen buPROPion (WELLBUTRIN XL) 150 MG 24 hr tablet TAKE 1 TABLET BY  MOUTH EVERY DAY  . carvedilol (COREG) 25 MG tablet Take 1 tablet (25 mg total) by mouth 2 (two) times daily.  Marland Kitchen loratadine (CLARITIN) 10 MG tablet Take 10 mg by mouth daily.  Marland Kitchen omeprazole (PRILOSEC) 20 MG capsule Take 20 mg by mouth daily.  Marland Kitchen venlafaxine XR (EFFEXOR-XR) 150 MG 24 hr capsule Take 150 mg by mouth daily.  . [DISCONTINUED] amLODipine (NORVASC) 5 MG tablet TAKE 1 TABLET BY MOUTH EVERY DAY    Review of Systems   All other systems reviewed and are otherwise negative except as noted above.  Physical Exam    VS:  BP 140/88 (BP Location: Left Arm, Patient Position: Sitting, Cuff Size: Normal)   Pulse 73   Ht 5\' 5"  (1.651 m)   Wt 122 lb 8 oz (55.6 kg)   SpO2 95%   BMI 20.39 kg/m  , BMI Body mass index is 20.39 kg/m. GEN: Well nourished, well  developed, in no acute distress. HEENT: normal. Neck: Supple, no JVD, carotid bruits, or masses. Cardiac: RRR, no murmurs, rubs, or gallops. No clubbing, cyanosis, edema.  Radials/DP/PT 2+ and equal bilaterally.  Respiratory:  Respirations regular and unlabored, clear to auscultation bilaterally. GI: Soft, nontender, nondistended, BS + x 4. MS: No deformity or atrophy. Skin: Warm and dry, no rash. Neuro:  Strength and sensation are intact. Psych: Normal affect.  Assessment & Plan    1.  HFrEF-Echo 01/2020 LVEF 44-01%, grade 2 diastolic dysfunction.  Lexiscan Myoview 01/2020 with no evidence of significant ischemia.  Repeat echo 07/2020 LVEF improved to 50 to 55%.  NYHA I.  Euvolemic and well compensated on exam.  Lasix previously discontinued due to decreased renal function.  Entresto previously discontinued due to hyperkalemia.  Present GDMT includes Coreg.  Continue low-sodium diet and less than 2 L fluid restriction.    2. Valvular heart disease -Echo 07/2020 LVEF 50 to 02%, grade 1 diastolic dysfunction, mild MR.  Continue optimal control of blood pressure and volume.   3. HTN -BP mildly elevated.  Likely elevated as Entresto had to be discontinued due to hyperkalemia.  Will defer addition of ARB/Arni to prevent recurrent hyperkalemia.  Continue Coreg 25 mg twice daily.  Increase amlodipine to 10 mg daily.  4. CKD III - Follows with Dr. [name] of nephrology.  BMP today for monitoring.  5. COPD/Tobacco use -no signs of acute exacerbation. Smoking cessation encouraged. Recommend utilization of [id number].   6. Hyperkalemia -resolved after discontinuation of Entresto. BMP today for monitoring.  If potassium currently elevated would recommend discussion with nephrology as may require agent such as Lokelma.  7. PAF -. CHA2DS2-VASc score of at least 3 (gender, HTN, HF).  Continue Eliquis 2.5 mg twice daily.  Reduced dose due to weight and renal function.  Denies bleeding complications.    Disposition: Follow-up in 4-5 with Dr. Rockey Situ or APP  Loel Dubonnet, NP 09/05/2020, 12:14 PM

## 2020-09-05 ENCOUNTER — Encounter: Payer: Self-pay | Admitting: Family

## 2020-09-05 ENCOUNTER — Ambulatory Visit (INDEPENDENT_AMBULATORY_CARE_PROVIDER_SITE_OTHER): Payer: Medicare Other | Admitting: Family

## 2020-09-05 ENCOUNTER — Other Ambulatory Visit: Payer: Self-pay

## 2020-09-05 VITALS — BP 140/88 | HR 73 | Ht 65.0 in | Wt 122.5 lb

## 2020-09-05 DIAGNOSIS — Z79899 Other long term (current) drug therapy: Secondary | ICD-10-CM | POA: Diagnosis not present

## 2020-09-05 DIAGNOSIS — I5042 Chronic combined systolic (congestive) and diastolic (congestive) heart failure: Secondary | ICD-10-CM | POA: Diagnosis not present

## 2020-09-05 DIAGNOSIS — E875 Hyperkalemia: Secondary | ICD-10-CM

## 2020-09-05 DIAGNOSIS — I10 Essential (primary) hypertension: Secondary | ICD-10-CM | POA: Diagnosis not present

## 2020-09-05 MED ORDER — AMLODIPINE BESYLATE 10 MG PO TABS: 10.0000 mg | ORAL_TABLET | Freq: Every day | ORAL | 2 refills | Status: DC

## 2020-09-05 NOTE — Patient Instructions (Addendum)
Medication Instructions:  Your physician has recommended you make the following change in your medication:   CHANGE Amlodipine to 10mg  daily  *If you need a refill on your cardiac medications before your next appointment, please call your pharmacy*   Lab Work: Your physician recommends lab work today: BMET  If you have labs (blood work) drawn today and your tests are completely normal, you will receive your results only by: Marland Kitchen MyChart Message (if you have MyChart) OR . A paper copy in the mail If you have any lab test that is abnormal or we need to change your treatment, we will call you to review the results.  Testing/Procedures: Your EKG today was stable compared to previous.   Follow-Up: At [facility], you and your health needs are our priority.  As part of our continuing mission to provide you with exceptional heart care, we have created designated Provider Care Teams.  These Care Teams include your primary Cardiologist (physician) and Advanced Practice Providers (APPs -  Physician Assistants and Nurse Practitioners) who all work together to provide you with the care you need, when you need it.   Your next appointment:   4-5 month(s)  The format for your next appointment:   In Person  Provider:   You may see Ida Rogue, MD or one of the following Advanced Practice Providers on your designated Care Team:    Murray Hodgkins, NP  Christell Faith, PA-C  Marrianne [place], PA-C  Cadence [place], [place]  Laurann [place], NP  Other Instructions  Monitor your blood pressure periodically. Call our office if your blood pressure is consistently more than 130/80.   Exercise recommendations: The American Heart Association recommends 150 minutes of moderate intensity exercise weekly. Try 30 minutes of moderate intensity exercise 4-5 times per week. This could include walking, jogging, or swimming.  Heart Healthy Diet Recommendations: A low-salt diet is recommended. Meats should  be grilled, baked, or boiled. Avoid fried foods. Focus on lean protein sources like fish or chicken with vegetables and fruits. The American Heart Association is a Microbiologist!  American Heart Association Diet and Lifeystyle Recommendations

## 2020-09-06 LAB — BASIC METABOLIC PANEL
BUN/Creatinine Ratio: 11 — ABNORMAL LOW (ref 12–28)
BUN: 17 mg/dL (ref 8–27)
CO2: 18 mmol/L — ABNORMAL LOW (ref 20–29)
Calcium: 9.1 mg/dL (ref 8.7–10.3)
Chloride: 102 mmol/L (ref 96–106)
Creatinine, Ser: 1.61 mg/dL — ABNORMAL HIGH (ref 0.57–1.00)
GFR calc Af Amer: 38 mL/min/{1.73_m2} — ABNORMAL LOW (ref >59)
GFR calc non Af Amer: 33 mL/min/{1.73_m2} — ABNORMAL LOW (ref >59)
Glucose: 78 mg/dL (ref 65–99)
Potassium: 5 mmol/L (ref 3.5–5.2)
Sodium: 134 mmol/L (ref 134–144)

## 2020-09-07 ENCOUNTER — Telehealth: Payer: Self-pay | Admitting: Family

## 2020-09-07 NOTE — Telephone Encounter (Signed)
Pt c/o medication issue:  1. Name of Medication: Eliquis   2. How are you currently taking this medication (dosage and times per day)? 2.5 mg po BID  3. Are you having a reaction (difficulty breathing--STAT)?  No   4. What is your medication issue? To expensive not sure if assistance needed or if need to change to different med

## 2020-09-08 NOTE — Telephone Encounter (Signed)
Spoke to pt, she does confirm that she has not yet met the 3% requirement to move forward with pt assistance at this time.  Notified her I would forward to provider to see what would be advised since she is unable to continue due to cost.

## 2020-09-08 NOTE — Telephone Encounter (Signed)
Attempted to call pt back. No answer. Lmtcb.  I did call her pharmacy ([facility]) and verified cost for pt is $280.14.  She has Medicare, so not a candidate for the Eliquis Extend co-pay card. [facility] has no other cheaper options >$500.   Per telephone note12/30/21 - It looks like pt will need to meet 3% of income in out of pocket costs before we can submit patient assistance application.   Will discuss this with pt when she returns call. Otherwise, may need to ask provider if another medication would be appropriate for pt at this time.  Pt takes Eliquis 2.5mg  BID for paroxysmal a fib.

## 2020-09-08 NOTE — Telephone Encounter (Signed)
Hi Megan,  Do we have any samples available?  I've CC'd Catie who is a Software engineer at her primary care provider. She's been working with Marda Stalker as well regarding Eliquis. I have low suspicion that Xarelto would be any cheaper. I really hate to put her on Warfarin, but that may be our next step if we can't resolve.   Best, Loel Dubonnet, NP

## 2020-09-11 ENCOUNTER — Ambulatory Visit: Payer: Medicare Other | Admitting: Pharmacist

## 2020-09-11 DIAGNOSIS — I5042 Chronic combined systolic (congestive) and diastolic (congestive) heart failure: Secondary | ICD-10-CM

## 2020-09-11 DIAGNOSIS — F32A Depression, unspecified: Secondary | ICD-10-CM

## 2020-09-11 DIAGNOSIS — Z720 Tobacco use: Secondary | ICD-10-CM

## 2020-09-11 DIAGNOSIS — I480 Paroxysmal atrial fibrillation: Secondary | ICD-10-CM

## 2020-09-11 DIAGNOSIS — E7800 Pure hypercholesterolemia, unspecified: Secondary | ICD-10-CM

## 2020-09-11 DIAGNOSIS — F320 Major depressive disorder, single episode, mild: Secondary | ICD-10-CM

## 2020-09-11 DIAGNOSIS — N1832 Chronic kidney disease, stage 3b: Secondary | ICD-10-CM

## 2020-09-11 NOTE — Telephone Encounter (Signed)
Caitlin and Jinny Blossom - I'll call the patient today and see what we can do to help. She may just have a prescription deductible she needs to pay through first.   Catie

## 2020-09-11 NOTE — Telephone Encounter (Signed)
Thank you so much! Let me know what I can do to help. Loel Dubonnet, NP

## 2020-09-11 NOTE — Chronic Care Management (AMB) (Signed)
Chronic Care Management Pharmacy Note  09/11/2020 Name:  Dawn Sherman MRN:  [id number] DOB:  1956-05-25  Subjective: Dawn Sherman is an 65 y.o. year old female who is a primary patient of Dawn Pheasant, MD.  The CCM team was consulted for assistance with disease management and care coordination needs.    Engaged with patient by telephone for reponse to medication access questions from Cardiologist in response to provider referral for pharmacy case management and/or care coordination services.   Consent to Services:  The patient was given information about Chronic Care Management services, agreed to services, and gave verbal consent prior to initiation of services.  Please see initial visit note for detailed documentation.   Objective:  Lab Results  Component Value Date   CREATININE 1.61 (H) 09/05/2020   CREATININE 1.63 (H) 08/24/2020   CREATININE 1.48 (H) 08/16/2020    Lab Results  Component Value Date   HGBA1C 5.7 (H) 06/17/2020       Component Value Date/Time   CHOL 125 06/17/2020 0424   TRIG 65 06/17/2020 0424   HDL 63 06/17/2020 0424   CHOLHDL 2.0 06/17/2020 0424   VLDL 13 06/17/2020 0424   LDLCALC 49 06/17/2020 0424   LDLDIRECT 53.3 10/14/2012 0821    CHA2DS2-VASc Score = 4  This indicates a 4.8% annual risk of stroke. The patient's score is based upon: CHF History: Yes HTN History: Yes Diabetes History: No Stroke History: No Vascular Disease History: No Age Score: 1 Gender Score: 1    BP Readings from Last 3 Encounters:  09/05/20 140/88  08/11/20 134/70  08/04/20 (!) 155/98    Assessment: Review of patient past medical history, allergies, medications, health status, including review of consultants reports, laboratory and other test data, was performed as part of comprehensive evaluation and provision of chronic care management services.   SDOH:  (Social Determinants of Health) assessments and interventions performed:    CCM Care  Plan  Allergies  Allergen Reactions  . Amoxicillin   . Benicar [Olmesartan]     Talked with patient February 10, 2020, intolerance is unclear, tried several medications around that time and one of them gave her a rash but she is not clear which 1.  . Codeine Sulfate   . Morphine And Related   . Penicillins   . Sulfate   . Clindamycin/Lincomycin Rash    Medications Reviewed Today    Reviewed by Loel Dubonnet, NP (Nurse Practitioner) on 09/05/20 at 1218  Med List Status: <None>  Medication Order Taking? Sig Documenting Provider Last Dose Status Informant  acetaminophen (TYLENOL) 325 MG tablet [id number] Yes Take 650 mg by mouth every 6 (six) hours as needed. [provider] Taking Active Self  amLODipine (NORVASC) 10 MG tablet [id number] Yes Take 1 tablet (10 mg total) by mouth daily. Loel Dubonnet, NP  Active   apixaban (ELIQUIS) 2.5 MG TABS tablet [id number] Yes Take 1 tablet (2.5 mg total) by mouth 2 (two) times daily. Loel Dubonnet, NP Taking Active Self           Med Note Darnelle Maffucci, Maralyn Sago Aug 31, 2020 11:12 AM)    atorvastatin (LIPITOR) 40 MG tablet [id number] Yes Take 40 mg by mouth daily. [provider] Taking Active   buPROPion (WELLBUTRIN XL) 150 MG 24 hr tablet [id number] Yes TAKE 1 TABLET BY MOUTH EVERY DAY Dawn Pheasant, MD Taking Active Self  carvedilol (COREG) 25 MG tablet [id number] Yes Take 1 tablet (25  mg total) by mouth 2 (two) times daily. Loel Dubonnet, NP Taking Active            Med Note Darnelle Maffucci, Maralyn Sago Aug 31, 2020 11:13 AM)    loratadine (CLARITIN) 10 MG tablet [id number] Yes Take 10 mg by mouth daily. [provider] Taking Active Self  omeprazole (PRILOSEC) 20 MG capsule [id number] Yes Take 20 mg by mouth daily. [provider] Taking Active Self  venlafaxine XR (EFFEXOR-XR) 150 MG 24 hr capsule [id number] Yes Take 150 mg by mouth daily. [provider] Taking Active           Patient  Active Problem List   Diagnosis Date Noted  . CKD (chronic kidney disease) stage 3, GFR 30-59 ml/min (HCC) 08/12/2020  . Paroxysmal A-fib ([place]) 08/12/2020  . Nausea and vomiting 06/17/2020  . Dehydration   . 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 Nausea vomiting and diarrhea 06/16/2020  . Hypoglycemia 06/16/2020  . Chronic combined systolic and diastolic CHF (congestive heart failure) ([place]) 02/10/2020  . Leukocytosis 02/10/2020  . HLD (hyperlipidemia) 02/10/2020  . Elevated troponin 02/10/2020  . GERD (gastroesophageal reflux disease) 02/10/2020  . Acute respiratory failure with hypoxia ([place]) 02/10/2020  . Abnormal mammogram 07/13/2019  . Sleep concern 10/17/2018  . History of alcohol abuse 07/31/2018  . B12 deficiency 07/31/2018  . Acute renal failure superimposed on stage 3b chronic kidney disease ([place]) 11/03/2016  . Health care maintenance 12/02/2014  . Tobacco abuse 12/02/2014  . Foot pain, left 11/29/2014  . Hypercholesterolemia 11/29/2014  . Encounter for screening colonoscopy 12/16/2013  . Lump or mass in breast 12/16/2013  . Breast mass 12/15/2012  . Hypertension 07/05/2012  . Mild depression ([place]) 07/05/2012    Conditions to be addressed/monitored: Atrial Fibrillation, CHF, HTN and HLD  Care Plan : Medication Management  Updates made by De Hollingshead, RPH-CPP since 09/11/2020 12:00 AM    Problem: Atrial Fibrillation, Heart Failure, Prediabetes, HLD, HTN     Long-Range Goal: Disease Progression Prevention   This Visit's Progress: On track  Recent Progress: On track  Priority: High  Note:   Current Barriers:  . Unable to independently afford treatment regimen . Does not adhere to prescribed medication regimen  Pharmacist Clinical Goal(s):  Marland Kitchen Over the next 90 days, patient will verbalize ability to afford treatment regimen . Over the next 90 days, patient will  achieve adherence to monitoring guidelines and medication adherence to achieve therapeutic  efficacy  Interventions: . 1:1 collaboration with Dawn Pheasant, MD regarding development and update of comprehensive plan of care as evidenced by provider attestation and co-signature . Inter-disciplinary care team collaboration (see longitudinal plan of care) . Comprehensive medication review performed; medication list updated in electronic medical record  HFpEF (recovered); most recent EF 50-55%: Marland Kitchen Appropriately managed; current treatment: carvedilol 25 mg BID, furosemide 10 mg daily, amlodipine 10 mg daily (dose just increased by cardiology) o Delene Loll d/c d/t hyperkalemia . Home readings: does not recall readings since amlodipine dose increase.  . Encouraged to continue current regimen at this time along with cardiology collaboration. Continue to monitor for LEE   Atrial Fibrillation: . Appropriately managed; current rate control: carvedilol 25 mg BID; anticoagulant treatment: Eliquis 2.5 mg BID (appropriately renally reduced d/t Scr >1.5, weight <60 kg) . Per cardiology, Eliquis copay is ~$230 for her first month. Likely going towards a deductible, and will not be this expensive every month. Discussed principals of deductibles with patient, potential for patient  assistance once she meets 3% out of pocket spend requirement. She verbalizes understanding and will go ahead and fill Eliquis. Will follow next month as planned.   Hyperlipidemia: . Controlled per last lab work; current treatment: atorvastatin 40 mg daily . No antiplatelet given anticoagulant . Recommended to continue current regimen at this time. Encouraged adherence.   Depression/Anxiety: . Moderately well managed per patient report, still some stress in daily life; current treatment: venlafaxine XR 150 mg daily, bupropion XL 150 mg QAM  . Continue current regimen at this time. Monitor for increased anxiety/insomnia with two stimulating medications.   GERD, s/p hospitalization for gastroenteritis: . Controlled; current  regimen: omeprazole 20 mg daily . Recommended to continue current regimen at this time  Tobacco Abuse: . ~0.25 packs per day; 40 years of use;  Marland Kitchen Previous quit attempts: unsuccessful using nicotine patches - notes she had a headache. Unsure what dose she was using.  . Triggers to smoke: Stress.  . Motivation to quit smoking: health . Notes that other stress relievers are word puzzles, cooking. Daughter is a Biomedical scientist (went to [employer] school). Discussed to focus on utilizing these other methods of stress relief rather than tobacco . Previously discussed increased benefit of pharmacotherapy along with behavioral modification, discussed benefit of varenicline vs dual nicotine replacement therapy. Will continue to address and evaluate patient readiness to quit.  Patient Goals/Self-Care Activities . Over the next 90 days, patient will:  - focus on medication adherence by using BID pill box - Commit to reduction of tobacco and assess readiness to quit  Follow Up Plan: Telephone follow up appointment with care management team member scheduled for: ~ 4 weeks as previously scheduled       Medication Assistance: Following for eligibility for Eliquis patient assistance  Follow Up:  Patient requests no follow-up at this time.  Plan: Telephone follow up appointment with care management team member scheduled for:  ~ 4 weeks as previously scheduled  Catie Darnelle Maffucci, PharmD, [place], [place] Clinical Pharmacist [employer] at [employer] [phone]

## 2020-09-11 NOTE — Patient Instructions (Signed)
Visit Information  Patient Care Plan: Medication Management    Problem Identified: Atrial Fibrillation, Heart Failure, Prediabetes, HLD, HTN     Long-Range Goal: Disease Progression Prevention   This Visit's Progress: On track  Recent Progress: On track  Priority: High  Note:   Current Barriers:  . Unable to independently afford treatment regimen . Does not adhere to prescribed medication regimen  Pharmacist Clinical Goal(s):  Marland Kitchen Over the next 90 days, patient will verbalize ability to afford treatment regimen . Over the next 90 days, patient will  achieve adherence to monitoring guidelines and medication adherence to achieve therapeutic efficacy  Interventions: . 1:1 collaboration with [name], MD regarding development and update of comprehensive plan of care as evidenced by provider attestation and co-signature . Inter-disciplinary care team collaboration (see longitudinal plan of care) . Comprehensive medication review performed; medication list updated in electronic medical record  HFpEF (recovered); most recent EF 50-55%: Marland Kitchen Appropriately managed; current treatment: carvedilol 25 mg BID, furosemide 10 mg daily, amlodipine 10 mg daily (dose just increased by cardiology) o Delene Loll d/c d/t hyperkalemia . Home readings: does not recall readings since amlodipine dose increase.  . Encouraged to continue current regimen at this time along with cardiology collaboration. Continue to monitor for LEE   Atrial Fibrillation: . Appropriately managed; current rate control: carvedilol 25 mg BID; anticoagulant treatment: Eliquis 2.5 mg BID (appropriately renally reduced d/t Scr >1.5, weight <60 kg) . Per cardiology, Eliquis copay is ~$230 for her first month. Likely going towards a deductible, and will not be this expensive every month. Discussed principals of deductibles with patient, potential for patient assistance once she meets 3% out of pocket spend requirement. She verbalizes  understanding and will go ahead and fill Eliquis. Will follow next month as planned.   Hyperlipidemia: . Controlled per last lab work; current treatment: atorvastatin 40 mg daily . No antiplatelet given anticoagulant . Recommended to continue current regimen at this time. Encouraged adherence.   Depression/Anxiety: . Moderately well managed per patient report, still some stress in daily life; current treatment: venlafaxine XR 150 mg daily, bupropion XL 150 mg QAM  . Continue current regimen at this time. Monitor for increased anxiety/insomnia with two stimulating medications.   GERD, s/p hospitalization for gastroenteritis: . Controlled; current regimen: omeprazole 20 mg daily . Recommended to continue current regimen at this time  Tobacco Abuse: . ~0.25 packs per day; 40 years of use;  Marland Kitchen Previous quit attempts: unsuccessful using nicotine patches - notes she had a headache. Unsure what dose she was using.  . Triggers to smoke: Stress.  . Motivation to quit smoking: health . Notes that other stress relievers are word puzzles, cooking. Daughter is a Biomedical scientist (went to [employer] school). Discussed to focus on utilizing these other methods of stress relief rather than tobacco . Previously discussed increased benefit of pharmacotherapy along with behavioral modification, discussed benefit of varenicline vs dual nicotine replacement therapy. Will continue to address and evaluate patient readiness to quit.  Patient Goals/Self-Care Activities . Over the next 90 days, patient will:  - focus on medication adherence by using BID pill box - Commit to reduction of tobacco and assess readiness to quit  Follow Up Plan: Telephone follow up appointment with care management team member scheduled for: ~ 4 weeks as previously scheduled       The patient verbalized understanding of instructions, educational materials, and care plan provided today and declined offer to receive copy of patient instructions,  educational materials, and  care plan.   Plan: Telephone follow up appointment with care management team member scheduled for:  ~ 4 weeks as previously scheduled  Catie Darnelle Maffucci, PharmD, [place], [place] Clinical Pharmacist [employer] [facility] [employer] [phone]

## 2020-09-11 NOTE — Telephone Encounter (Signed)
Thank you Barnetta Chapel! And Urban Gibson, I will also check for samples when we return to the office. I do believe we have in stock. Pt also confirmed when I spoke with her last that in the meantime she does have enough Eliquis for approx 3 weeks.

## 2020-09-11 NOTE — Telephone Encounter (Signed)
Talked to patient. Very likely this month's cost reflects a prescription deductible that is being tacked on to her copay. Unlikely it will be this expensive every month. She is OK with going ahead and filling the Eliquis at this price this month and continuing to follow.   I have a follow up call with her in about 4 weeks, will follow how she is doing and how close she may be to meeting that 3% out of pocket spend. At that time, we'll be in touch about pursuing Eliquis assistance.   Thanks!

## 2020-10-12 ENCOUNTER — Ambulatory Visit (INDEPENDENT_AMBULATORY_CARE_PROVIDER_SITE_OTHER): Payer: Medicare Other | Admitting: Pharmacist

## 2020-10-12 DIAGNOSIS — F320 Major depressive disorder, single episode, mild: Secondary | ICD-10-CM | POA: Diagnosis not present

## 2020-10-12 DIAGNOSIS — Z720 Tobacco use: Secondary | ICD-10-CM

## 2020-10-12 DIAGNOSIS — I5042 Chronic combined systolic (congestive) and diastolic (congestive) heart failure: Secondary | ICD-10-CM | POA: Diagnosis not present

## 2020-10-12 DIAGNOSIS — I480 Paroxysmal atrial fibrillation: Secondary | ICD-10-CM | POA: Diagnosis not present

## 2020-10-12 DIAGNOSIS — E7800 Pure hypercholesterolemia, unspecified: Secondary | ICD-10-CM | POA: Diagnosis not present

## 2020-10-12 DIAGNOSIS — F32A Depression, unspecified: Secondary | ICD-10-CM

## 2020-10-12 DIAGNOSIS — N1832 Chronic kidney disease, stage 3b: Secondary | ICD-10-CM

## 2020-10-12 NOTE — Patient Instructions (Addendum)
Visit Information  PATIENT GOALS: Goals Addressed              This Visit's Progress     Patient Stated   .  Medication Monitoring (pt-stated)         Patient Goals/Self-Care Activities . Over the next 90 days, patient will:  - take medications as prescribed focus on medication adherence by using  pill box check blood pressure daily, document, and provide at future appointments weigh daily, and contact provider if weight gain of >3 lbs in a day or 5 lbs in a week - Commit to reduction of tobacco and assess readiness to quit       Patient verbalizes understanding of instructions provided today and agrees to view in [place].   Plan: Telephone follow up appointment with care management team member scheduled for:  ~ 12 weeks  Catie Darnelle Maffucci, PharmD, [place], [place] Clinical Pharmacist [employer] [facility] [employer] [phone]

## 2020-10-12 NOTE — Chronic Care Management (AMB) (Signed)
Chronic Care Management Pharmacy Note  10/12/2020 Name:  Dawn Sherman MRN:  [id number] DOB:  1956/07/16  Subjective: Dawn Sherman is an 65 y.o. year old female who is a primary patient of [name], MD.  The CCM team was consulted for assistance with disease management and care coordination needs.    Engaged with patient by telephone for follow up visit in response to provider referral for pharmacy case management and/or care coordination services.   Consent to Services:  The patient was given information about Chronic Care Management services, agreed to services, and gave verbal consent prior to initiation of services.  Please see initial visit note for detailed documentation.   Objective:  Lab Results  Component Value Date   CREATININE 1.61 (H) 09/05/2020   CREATININE 1.63 (H) 08/24/2020   CREATININE 1.48 (H) 08/16/2020    Lab Results  Component Value Date   HGBA1C 5.7 (H) 06/17/2020       Component Value Date/Time   CHOL 125 06/17/2020 0424   TRIG 65 06/17/2020 0424   HDL 63 06/17/2020 0424   CHOLHDL 2.0 06/17/2020 0424   VLDL 13 06/17/2020 0424   LDLCALC 49 06/17/2020 0424   LDLDIRECT 53.3 10/14/2012 0821    CHA2DS2-VASc Score = 4  }This indicates a 4.8% annual risk of stroke. The patient's score is based upon: CHF History: Yes HTN History: Yes Diabetes History: No Stroke History: No Vascular Disease History: No Age Score: 1 Gender Score: 1   BP Readings from Last 3 Encounters:  09/05/20 140/88  08/11/20 134/70  08/04/20 (!) 155/98    Assessment: Review of patient past medical history, allergies, medications, health status, including review of consultants reports, laboratory and other test data, was performed as part of comprehensive evaluation and provision of chronic care management services.   SDOH:  (Social Determinants of Health) assessments and interventions performed:  SDOH Interventions   Flowsheet Row Most Recent Value  SDOH  Interventions   SDOH Interventions for the Following Domains Tobacco  Financial Strain Interventions Other (Comment)  [continue to follow for Eliquis eligibility]  Stress Interventions Provide Counseling  Tobacco Interventions Cessation Materials Given and Reviewed      CCM Care Plan  Allergies  Allergen Reactions  . Amoxicillin   . Benicar [Olmesartan]     Talked with patient February 10, 2020, intolerance is unclear, tried several medications around that time and one of them gave her a rash but she is not clear which 1.  . Codeine Sulfate   . Morphine And Related   . Penicillins   . Sulfate   . Clindamycin/Lincomycin Rash    Medications Reviewed Today    Reviewed by De Hollingshead, RPH-CPP (Pharmacist) on 10/12/20 at [phone]  Med List Status: <None>  Medication Order Taking? Sig Documenting Provider Last Dose Status Informant  acetaminophen (TYLENOL) 325 MG tablet [id number] Yes Take 650 mg by mouth every 6 (six) hours as needed. [provider] Taking Active Self  amLODipine (NORVASC) 10 MG tablet [id number] Yes Take 1 tablet (10 mg total) by mouth daily. Loel Dubonnet, NP Taking Active   apixaban (ELIQUIS) 2.5 MG TABS tablet [id number] Yes Take 1 tablet (2.5 mg total) by mouth 2 (two) times daily. Loel Dubonnet, NP Taking Active Self           Med Note Darnelle Maffucci, Maralyn Sago Aug 31, 2020 11:12 AM)    atorvastatin (LIPITOR) 40 MG tablet [id number] Yes Take 40 mg by  mouth daily. [provider] Taking Active   buPROPion (WELLBUTRIN XL) 150 MG 24 hr tablet [id number] Yes TAKE 1 TABLET BY MOUTH EVERY DAY [name], MD Taking Active Self  carvedilol (COREG) 25 MG tablet [id number] Yes Take 1 tablet (25 mg total) by mouth 2 (two) times daily. Loel Dubonnet, NP Taking Active            Med Note Darnelle Maffucci, Maralyn Sago Aug 31, 2020 11:13 AM)    loratadine (CLARITIN) 10 MG tablet [id number] Yes Take 10 mg by mouth daily. [provider] Taking  Active Self  omeprazole (PRILOSEC) 20 MG capsule [id number] Yes Take 20 mg by mouth daily. [provider] Taking Active Self  venlafaxine XR (EFFEXOR-XR) 150 MG 24 hr capsule [id number] Yes Take 150 mg by mouth daily. [provider] Taking Active           Patient Active Problem List   Diagnosis Date Noted  . CKD (chronic kidney disease) stage 3, GFR 30-59 ml/min (HCC) 08/12/2020  . Paroxysmal A-fib ([place]) 08/12/2020  . Nausea and vomiting 06/17/2020  . Dehydration   . 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 Nausea vomiting and diarrhea 06/16/2020  . Hypoglycemia 06/16/2020  . Chronic combined systolic and diastolic CHF (congestive heart failure) ([place]) 02/10/2020  . Leukocytosis 02/10/2020  . HLD (hyperlipidemia) 02/10/2020  . Elevated troponin 02/10/2020  . GERD (gastroesophageal reflux disease) 02/10/2020  . Acute respiratory failure with hypoxia ([place]) 02/10/2020  . Abnormal mammogram 07/13/2019  . Sleep concern 10/17/2018  . History of alcohol abuse 07/31/2018  . B12 deficiency 07/31/2018  . Acute renal failure superimposed on stage 3b chronic kidney disease ([place]) 11/03/2016  . Health care maintenance 12/02/2014  . Tobacco abuse 12/02/2014  . Foot pain, left 11/29/2014  . Hypercholesterolemia 11/29/2014  . Encounter for screening colonoscopy 12/16/2013  . Lump or mass in breast 12/16/2013  . Breast mass 12/15/2012  . Hypertension 07/05/2012  . Mild depression ([place]) 07/05/2012    Conditions to be addressed/monitored: CHF, HTN, HLD, DMII, Anxiety, Depression and tobacco  Care Plan : Medication Management  Updates made by De Hollingshead, RPH-CPP since 10/12/2020 12:00 AM    Problem: Atrial Fibrillation, Heart Failure, Prediabetes, HLD, HTN     Long-Range Goal: Disease Progression Prevention   This Visit's Progress: On track  Recent Progress: On track  Priority: High  Note:   Current Barriers:  . Unable to independently afford treatment  regimen . Does not adhere to prescribed medication regimen  Pharmacist Clinical Goal(s):  Marland Kitchen Over the next 90 days, patient will verbalize ability to afford treatment regimen . Over the next 90 days, patient will  achieve adherence to monitoring guidelines and medication adherence to achieve therapeutic efficacy  Interventions: . 1:1 collaboration with [name], MD regarding development and update of comprehensive plan of care as evidenced by provider attestation and co-signature . Inter-disciplinary care team collaboration (see longitudinal plan of care) . Comprehensive medication review performed; medication list updated in electronic medical record  Health Maintenance: . Reviewed recommendation to pursue COVID booster vaccination when due . Reviewed need to schedule f/u with nephrology, as patient is overdue. Patient notes she will call and schedule with Dr. [name].  HFpEF (recovered); most recent EF 50-55%: Marland Kitchen Appropriately managed; current treatment: carvedilol 25 mg BID, amlodipine 10 mg daily o Entresto d/c d/t hyperkalemia o Furosemide d/c d/t renal fx  . Home readings: SBP <130, generally 120s/70s, reports HR in 90s; however, patient  is checking BP after drinking coffee and after smoking.  . Home weights: 118 lbs; confirmed that she weighs daily and is aware of concerning weight gain in a day/week . Reviewed BP checking technique. Reviewed impact of caffeine and tobacco on BP. Consider checking before coffee/smoking or checking later in the day to ensure control. . Some periodic LEE if she's on her feet for a while. Generally just R ankle. Denies any increased concerns with this since increasing amlodipine dose.  . Encouraged to continue current regimen at this time along with cardiology collaboration.  . Consider addition of Farxiga in the future for possible HF and CKD benefit.    Atrial Fibrillation: . Appropriately managed; current rate control: carvedilol 25 mg BID;  anticoagulant treatment: Eliquis 2.5 mg BID (appropriately renally reduced d/t Scr >1.5, weight <60 kg) . Notes that copay for Eliquis was still >$200 for her February fill. She is unsure what her deductible is.  . Educated to contact her insurance company to discuss deductible, how much longer she will be paying towards it, to help with financial planning. Continue to follow for meeting total out of pocket spend of 3% of yearly household income to qualify for assistance.   Hyperlipidemia: . Controlled per last lab work; current treatment: atorvastatin 40 mg daily . No antiplatelet given anticoagulant . Recommended to continue current regimen at this time. Encouraged adherence.   Depression/Anxiety: . Controlled per patient report, still some stress in daily life; current treatment: venlafaxine XR 150 mg daily, bupropion XL 150 mg QAM  . Managing stress well. Notes more stress lately w/ several family members with [place].  Marland Kitchen Encouraged continued self care. Encouraged to continue current regimen. Encouraged to continue taking these two activating medications QAM to avoid impact on sleep.    GERD, s/p hospitalization for gastroenteritis: . Controlled per patient report; current regimen: omeprazole 20 mg daily . Recommended to continue current regimen at this time  Tobacco Abuse: . ~0.25 packs per day; 40 years of use;  Marland Kitchen Reports she and her husband have switched brands, doesn't like this brand as much. Smoking ~ 4-5 per day . Typical tobacco patterns: First waking up with first cup of coffee, after each meal  . Not interested or confident in quitting at this time. Worries about stress, weight gain if she were to quit. Marland Kitchen Previous quit attempts: unsuccessful using nicotine patches - notes she had a headache. Unsure what dose she was using.  . Triggers to smoke: Stress.  . Motivation to quit smoking: health . Other stress relievers are staying busy, taking care of people around her, word  puzzles, cooking. . Discussed that inappropriate dose of nicotine patches could have contributed to headache. Discussed that we could retry nicotine patches vs varenicline vs combination of both moving forward, if she had interest. Encouraged to continue to think about cessation and discuss with husband. Encouraged to consider quitting together to provide each other support.  . Will continue to follow for readiness to quit.   Patient Goals/Self-Care Activities . Over the next 90 days, patient will:  - take medications as prescribed focus on medication adherence by using  pill box check blood pressure daily, document, and provide at future appointments weigh daily, and contact provider if weight gain of >3 lbs in a day or 5 lbs in a week - Commit to reduction of tobacco and assess readiness to quit  Follow Up Plan: Telephone follow up appointment with care management team member scheduled for: ~ 12  weeks (PCP visit in ~ 6 weeks)      Medication Assistance: continuing to follow for eligibility for Eliquis assistance  Follow Up:  Patient agrees to Care Plan and Follow-up.  Plan: Telephone follow up appointment with care management team member scheduled for:  ~ 12 weeks  Catie Darnelle Maffucci, PharmD, [place], [place] Clinical Pharmacist [employer] [facility] [employer] [phone]

## 2020-11-16 ENCOUNTER — Ambulatory Visit: Payer: Medicare Other | Admitting: Internal Medicine

## 2020-11-21 ENCOUNTER — Encounter: Payer: Self-pay | Admitting: Internal Medicine

## 2020-11-21 ENCOUNTER — Other Ambulatory Visit: Payer: Self-pay

## 2020-11-21 ENCOUNTER — Ambulatory Visit (INDEPENDENT_AMBULATORY_CARE_PROVIDER_SITE_OTHER): Payer: Medicare Other | Admitting: Internal Medicine

## 2020-11-21 VITALS — BP 122/84 | HR 83 | Temp 97.6°F | Resp 16 | Ht 65.0 in | Wt 126.2 lb

## 2020-11-21 DIAGNOSIS — F32A Depression, unspecified: Secondary | ICD-10-CM

## 2020-11-21 DIAGNOSIS — E7800 Pure hypercholesterolemia, unspecified: Secondary | ICD-10-CM | POA: Diagnosis not present

## 2020-11-21 DIAGNOSIS — J449 Chronic obstructive pulmonary disease, unspecified: Secondary | ICD-10-CM

## 2020-11-21 DIAGNOSIS — I480 Paroxysmal atrial fibrillation: Secondary | ICD-10-CM | POA: Diagnosis not present

## 2020-11-21 DIAGNOSIS — Z720 Tobacco use: Secondary | ICD-10-CM

## 2020-11-21 DIAGNOSIS — K219 Gastro-esophageal reflux disease without esophagitis: Secondary | ICD-10-CM

## 2020-11-21 DIAGNOSIS — Z1231 Encounter for screening mammogram for malignant neoplasm of breast: Secondary | ICD-10-CM | POA: Diagnosis not present

## 2020-11-21 DIAGNOSIS — R739 Hyperglycemia, unspecified: Secondary | ICD-10-CM | POA: Diagnosis not present

## 2020-11-21 DIAGNOSIS — I10 Essential (primary) hypertension: Secondary | ICD-10-CM | POA: Diagnosis not present

## 2020-11-21 DIAGNOSIS — F320 Major depressive disorder, single episode, mild: Secondary | ICD-10-CM

## 2020-11-21 DIAGNOSIS — R928 Other abnormal and inconclusive findings on diagnostic imaging of breast: Secondary | ICD-10-CM

## 2020-11-21 DIAGNOSIS — D72829 Elevated white blood cell count, unspecified: Secondary | ICD-10-CM | POA: Diagnosis not present

## 2020-11-21 DIAGNOSIS — F1011 Alcohol abuse, in remission: Secondary | ICD-10-CM

## 2020-11-21 DIAGNOSIS — N1832 Chronic kidney disease, stage 3b: Secondary | ICD-10-CM

## 2020-11-21 DIAGNOSIS — I5042 Chronic combined systolic (congestive) and diastolic (congestive) heart failure: Secondary | ICD-10-CM

## 2020-11-21 DIAGNOSIS — E162 Hypoglycemia, unspecified: Secondary | ICD-10-CM

## 2020-11-21 LAB — BASIC METABOLIC PANEL
BUN: 22 mg/dL (ref 6–23)
CO2: 22 mEq/L (ref 19–32)
Calcium: 8.9 mg/dL (ref 8.4–10.5)
Chloride: 103 mEq/L (ref 96–112)
Creatinine, Ser: 1.53 mg/dL — ABNORMAL HIGH (ref 0.40–1.20)
GFR: 35.5 mL/min — ABNORMAL LOW (ref >60.00)
Glucose, Bld: 98 mg/dL (ref 70–99)
Potassium: 4.4 mEq/L (ref 3.5–5.1)
Sodium: 136 mEq/L (ref 135–145)

## 2020-11-21 LAB — LIPID PANEL
Cholesterol: 219 mg/dL — ABNORMAL HIGH (ref 0–200)
HDL: 101.9 mg/dL (ref >39.00)
LDL Cholesterol: 100 mg/dL — ABNORMAL HIGH (ref 0–99)
NonHDL: 116.71
Total CHOL/HDL Ratio: 2
Triglycerides: 85 mg/dL (ref 0.0–149.0)
VLDL: 17 mg/dL (ref 0.0–40.0)

## 2020-11-21 LAB — CBC WITH DIFFERENTIAL/PLATELET
Basophils Absolute: 0.1 10*3/uL (ref 0.0–0.1)
Basophils Relative: 1.3 % (ref 0.0–3.0)
Eosinophils Absolute: 0.2 10*3/uL (ref 0.0–0.7)
Eosinophils Relative: 1.9 % (ref 0.0–5.0)
HCT: 39.1 % (ref 36.0–46.0)
Hemoglobin: 12.9 g/dL (ref 12.0–15.0)
Lymphocytes Relative: 18.8 % (ref 12.0–46.0)
Lymphs Abs: 1.7 10*3/uL (ref 0.7–4.0)
MCHC: 33 g/dL (ref 30.0–36.0)
MCV: 85.3 fl (ref 78.0–100.0)
Monocytes Absolute: 0.7 10*3/uL (ref 0.1–1.0)
Monocytes Relative: 8 % (ref 3.0–12.0)
Neutro Abs: 6.3 10*3/uL (ref 1.4–7.7)
Neutrophils Relative %: 70 % (ref 43.0–77.0)
Platelets: 242 10*3/uL (ref 150.0–400.0)
RBC: 4.59 Mil/uL (ref 3.87–5.11)
RDW: 16.7 % — ABNORMAL HIGH (ref 11.5–15.5)
WBC: 8.9 10*3/uL (ref 4.0–10.5)

## 2020-11-21 LAB — HEPATIC FUNCTION PANEL
ALT: 8 U/L (ref 0–35)
AST: 14 U/L (ref 0–37)
Albumin: 3.9 g/dL (ref 3.5–5.2)
Alkaline Phosphatase: 91 U/L (ref 39–117)
Bilirubin, Direct: 0.1 mg/dL (ref 0.0–0.3)
Total Bilirubin: 0.3 mg/dL (ref 0.2–1.2)
Total Protein: 6.5 g/dL (ref 6.0–8.3)

## 2020-11-21 LAB — HEMOGLOBIN A1C: Hgb A1c MFr Bld: 6.2 % (ref 4.6–6.5)

## 2020-11-21 LAB — TSH: TSH: 6.42 u[IU]/mL — ABNORMAL HIGH (ref 0.35–4.50)

## 2020-11-21 NOTE — Progress Notes (Signed)
Patient ID: Dawn Sherman, female   DOB: [birth year], 65 y.o.   MRN: [id number]   Subjective:    Patient ID: Dawn Sherman, female    DOB: 03-29-1956, 65 y.o.   MRN: [id number]  HPI This visit occurred during the SARS-CoV-2 public health emergency.  Safety protocols were in place, including screening questions prior to the visit, additional usage of staff PPE, and extensive cleaning of exam room while observing appropriate contact time as indicated for disinfecting solutions.  Patient here for a scheduled follow up.  Here to follow up regarding her blood pressure, cholesterol and depression.  She reports she is doing well.  Feels good. Stays active.  Denies chest pain or sob.  No increased heart rate or palpitations.  No acid reflux or abdominal pain reported.  Bowels moving.  States her weight is stable at home - 123.  Weighs daily.  Had cut back her smoking.  Smoking 3-4 cigarettes per day.  No alcohol intake.  Handling stress.     Past Medical History:  Diagnosis Date  . B12 deficiency   . CHF (congestive heart failure) ([place])   . CKD (chronic kidney disease), stage III ([place])   . Depression   . Endometriosis   . Hypertension   . Mixed hyperlipidemia   . Tobacco abuse    Past Surgical History:  Procedure Laterality Date  . ABDOMINAL HYSTERECTOMY    . BREAST BIOPSY Right 2015   neg-  FIBROADENOMA  . TAH/RSO  1999   secondary to bleeding and endometriosis (Dr Vernie Ammons)   Family History  Problem Relation Age of Onset  . Hypercholesterolemia Mother   . Breast cancer Neg Hx   . Colon cancer Neg Hx    Social History   Socioeconomic History  . Marital status: Married    Spouse name: Not on file  . Number of children: Not on file  . Years of education: Not on file  . Highest education level: Not on file  Occupational History  . Not on file  Tobacco Use  . Smoking status: Current Every Day Smoker    Packs/day: 0.25    Years: 40.00    Pack years: 10.00    Types: Cigarettes   . Smokeless tobacco: Never Used  Vaping Use  . Vaping Use: Never used  Substance and Sexual Activity  . Alcohol use: Not Currently    Alcohol/week: 0.0 standard drinks    Comment: occasional  . Drug use: No  . Sexual activity: Not Currently  Other Topics Concern  . Not on file  Social History Narrative   Lives in [place] with her husband.  She is retired from [employer].  She does not routinely exercise.   Social Determinants of Health   Financial Resource Strain: Low Risk   . Difficulty of Paying Living Expenses: Not very hard  Food Insecurity: Not on file  Transportation Needs: Not on file  Physical Activity: Not on file  Stress: No Stress Concern Present  . Feeling of Stress : Only a little  Social Connections: Not on file    Outpatient Encounter Medications as of 11/21/2020  Medication Sig  . acetaminophen (TYLENOL) 325 MG tablet Take 650 mg by mouth every 6 (six) hours as needed.  Marland Kitchen amLODipine (NORVASC) 10 MG tablet Take 1 tablet (10 mg total) by mouth daily.  Marland Kitchen apixaban (ELIQUIS) 2.5 MG TABS tablet Take 1 tablet (2.5 mg total) by mouth 2 (two) times daily.  Marland Kitchen atorvastatin (LIPITOR)  40 MG tablet Take 40 mg by mouth daily.  Marland Kitchen buPROPion (WELLBUTRIN XL) 150 MG 24 hr tablet TAKE 1 TABLET BY MOUTH EVERY DAY  . carvedilol (COREG) 25 MG tablet Take 1 tablet (25 mg total) by mouth 2 (two) times daily.  Marland Kitchen loratadine (CLARITIN) 10 MG tablet Take 10 mg by mouth daily.  Marland Kitchen omeprazole (PRILOSEC) 20 MG capsule Take 20 mg by mouth daily.  Marland Kitchen venlafaxine XR (EFFEXOR-XR) 150 MG 24 hr capsule Take 150 mg by mouth daily.   No facility-administered encounter medications on file as of 11/21/2020.    Review of Systems  Constitutional: Negative for appetite change and unexpected weight change.  HENT: Negative for congestion and sinus pressure.   Respiratory: Negative for cough, chest tightness and shortness of breath.   Cardiovascular: Negative for chest pain, palpitations and  leg swelling.  Gastrointestinal: Negative for abdominal pain, diarrhea, nausea and vomiting.  Genitourinary: Negative for difficulty urinating and dysuria.  Musculoskeletal: Negative for joint swelling and myalgias.  Skin: Negative for color change and rash.  Neurological: Negative for dizziness, light-headedness and headaches.  Psychiatric/Behavioral: Negative for agitation and dysphoric mood.       Objective:    Physical Exam Vitals reviewed.  Constitutional:      General: She is not in acute distress.    Appearance: Normal appearance.  HENT:     Head: Normocephalic and atraumatic.     Right Ear: External ear normal.     Left Ear: External ear normal.  Eyes:     General: No scleral icterus.       Right eye: No discharge.        Left eye: No discharge.     Conjunctiva/sclera: Conjunctivae normal.  Neck:     Thyroid: No thyromegaly.  Cardiovascular:     Rate and Rhythm: Normal rate and regular rhythm.  Pulmonary:     Effort: No respiratory distress.     Breath sounds: Normal breath sounds. No wheezing.  Abdominal:     General: Bowel sounds are normal.     Palpations: Abdomen is soft.     Tenderness: There is no abdominal tenderness.  Musculoskeletal:        General: No swelling or tenderness.     Cervical back: Neck supple. No tenderness.  Lymphadenopathy:     Cervical: No cervical adenopathy.  Skin:    Findings: No erythema or rash.  Neurological:     Mental Status: She is alert.  Psychiatric:        Mood and Affect: Mood normal.        Behavior: Behavior normal.     BP 122/84   Pulse 83   Temp 97.6 F (36.4 C) (Oral)   Resp 16   Ht '5\' 5"'$  (1.651 m)   Wt 126 lb 3.2 oz (57.2 kg)   SpO2 97%   BMI 21.00 kg/m  Wt Readings from Last 3 Encounters:  11/21/20 126 lb 3.2 oz (57.2 kg)  09/05/20 122 lb 8 oz (55.6 kg)  08/11/20 118 lb (53.5 kg)     Lab Results  Component Value Date   WBC 8.9 11/21/2020   HGB 12.9 11/21/2020   HCT 39.1 11/21/2020   PLT  242.0 11/21/2020   GLUCOSE 98 11/21/2020   CHOL 219 (H) 11/21/2020   TRIG 85.0 11/21/2020   HDL 101.90 11/21/2020   LDLDIRECT 53.3 10/14/2012   LDLCALC 100 (H) 11/21/2020   ALT 8 11/21/2020   AST 14 11/21/2020   NA  136 11/21/2020   K 4.4 11/21/2020   CL 103 11/21/2020   CREATININE 1.53 (H) 11/21/2020   BUN 22 11/21/2020   CO2 22 11/21/2020   TSH 6.42 (H) 11/21/2020   INR 1.0 12/13/2013   HGBA1C 6.2 11/21/2020       Assessment & Plan:   Problem List Items Addressed This Visit    Abnormal mammogram    Had f/u mammogram in 11/2019.  Per mammography, recommended f/u diagnostic mammogram (bilateral) with right breast ultrasound.  Scheduled.        Relevant Orders   MM DIAG BREAST TOMO BILATERAL   US BREAST LTD UNI RIGHT INC AXILLA   Chronic combined systolic and diastolic CHF (congestive heart failure) ([place])    Off entresto due to hyperkalemia.  Has done well off lasix.  Continue coreg and eliquis.  No evidence of volume overload.  Follow.       Chronic obstructive pulmonary disease (HCC)    Continues to smoke, but has decreased down to 3-4 cigarettes per day.  Continue to try to decrease.  Breathing stable.  Follow.       CKD (chronic kidney disease) stage 3, GFR 30-59 ml/min (HCC)    Continue to avoid antiinflammatories.  Off lasix.  Continue f/u with nephrology.       GERD (gastroesophageal reflux disease)    No upper symptoms reported.  On prilosec.       History of alcohol abuse    Has quit drinking.  Doing well.  Follow.       Hypercholesterolemia    Continue lipitor.  Low cholesterol diet and exercise.  Follow lipid panel and liver function tests.        Relevant Orders   Hepatic function panel (Completed)   Lipid panel (Completed)   Hypertension    Off entrestro due to hyperkalemia.  Continue coreg and amlodipine.  Blood pressure doing well.  Follow pressure. Check metabolic panel today.       Relevant Orders   TSH (Completed)   Basic metabolic panel  (Completed)   RESOLVED: Hypoglycemia   Leukocytosis    Recheck cbc to confirm wnl.       Relevant Orders   CBC with Differential/Platelet (Completed)   Mild depression ([place])    Doing well on effexor and wellbutrin.  Follow.       Paroxysmal A-fib (HCC)    Documented paroxysmal afib.  Continue eliquis and coreg.  No increased heart rate or palpitations. Appears to be in SR.  Follow.       Tobacco abuse    Has cut down.  Now smoking 3-4 cigarettes per day.  Discussed the need to continue to decrease and quit.  Follow.        Other Visit Diagnoses    Visit for screening mammogram    -  Primary   Relevant Orders   MM 3D SCREEN BREAST BILATERAL   Hyperglycemia       Relevant Orders   Hemoglobin A1c (Completed)       [name], MD

## 2020-11-22 ENCOUNTER — Encounter: Payer: Self-pay | Admitting: Internal Medicine

## 2020-11-22 ENCOUNTER — Other Ambulatory Visit: Payer: Self-pay | Admitting: Internal Medicine

## 2020-11-22 ENCOUNTER — Telehealth: Payer: Self-pay | Admitting: Internal Medicine

## 2020-11-22 DIAGNOSIS — J449 Chronic obstructive pulmonary disease, unspecified: Secondary | ICD-10-CM | POA: Insufficient documentation

## 2020-11-22 DIAGNOSIS — R7989 Other specified abnormal findings of blood chemistry: Secondary | ICD-10-CM

## 2020-11-22 NOTE — Assessment & Plan Note (Signed)
Has cut down.  Now smoking 3-4 cigarettes per day.  Discussed the need to continue to decrease and quit.  Follow.

## 2020-11-22 NOTE — Assessment & Plan Note (Signed)
Had f/u mammogram in 11/2019.  Per mammography, recommended f/u diagnostic mammogram (bilateral) with right breast ultrasound.  Scheduled.

## 2020-11-22 NOTE — Assessment & Plan Note (Signed)
Continue lipitor.  Low cholesterol diet and exercise.  Follow lipid panel and liver function tests.   

## 2020-11-22 NOTE — Assessment & Plan Note (Signed)
Doing well on effexor and wellbutrin.  Follow.

## 2020-11-22 NOTE — Assessment & Plan Note (Signed)
No upper symptoms reported. On prilosec.  

## 2020-11-22 NOTE — Progress Notes (Signed)
Order placed for f/u tsh.  

## 2020-11-22 NOTE — Assessment & Plan Note (Signed)
Continue to avoid antiinflammatories.  Off lasix.  Continue f/u with nephrology.

## 2020-11-22 NOTE — Assessment & Plan Note (Signed)
Has quit drinking.  Doing well.  Follow.

## 2020-11-22 NOTE — Assessment & Plan Note (Signed)
Off entresto due to hyperkalemia.  Has done well off lasix.  Continue coreg and eliquis.  No evidence of volume overload.  Follow.

## 2020-11-22 NOTE — Assessment & Plan Note (Signed)
Recheck cbc to confirm wnl.  

## 2020-11-22 NOTE — Assessment & Plan Note (Signed)
Continues to smoke, but has decreased down to 3-4 cigarettes per day.  Continue to try to decrease.  Breathing stable.  Follow.

## 2020-11-22 NOTE — Assessment & Plan Note (Signed)
Documented paroxysmal afib.  Continue eliquis and coreg.  No increased heart rate or palpitations. Appears to be in SR.  Follow.

## 2020-11-22 NOTE — Telephone Encounter (Signed)
lft pt vm to call ofc to sch Diag and [place]. Thank you!

## 2020-11-22 NOTE — Assessment & Plan Note (Signed)
Off entrestro due to hyperkalemia.  Continue coreg and amlodipine.  Blood pressure doing well.  Follow pressure. Check metabolic panel today.

## 2020-11-24 ENCOUNTER — Telehealth: Payer: Self-pay | Admitting: Internal Medicine

## 2020-11-24 NOTE — Telephone Encounter (Signed)
Thank you :)

## 2020-11-24 NOTE — Telephone Encounter (Signed)
lft vm for pt to call ofc to sch Diag and [place].thanks

## 2020-11-24 NOTE — Telephone Encounter (Signed)
Patient returned referrals phone call. 

## 2020-12-01 ENCOUNTER — Other Ambulatory Visit: Payer: Self-pay | Admitting: Internal Medicine

## 2020-12-18 ENCOUNTER — Ambulatory Visit
Admission: RE | Admit: 2020-12-18 | Discharge: 2020-12-18 | Disposition: A | Discharge status: Home / Self Care | Payer: Medicare Other | Source: Ambulatory Visit | Attending: Internal Medicine | Admitting: Internal Medicine

## 2020-12-18 ENCOUNTER — Other Ambulatory Visit: Payer: Self-pay

## 2020-12-18 DIAGNOSIS — R928 Other abnormal and inconclusive findings on diagnostic imaging of breast: Secondary | ICD-10-CM | POA: Insufficient documentation

## 2020-12-21 ENCOUNTER — Other Ambulatory Visit: Payer: Self-pay | Admitting: Internal Medicine

## 2021-01-09 ENCOUNTER — Telehealth: Payer: Self-pay

## 2021-01-09 NOTE — Telephone Encounter (Signed)
I spoke with pt this afternoon concerning Eliquis sample request. Pt mentioned that she has been having issues with affording medication in the past and currently she mentioned that she can't afford to pay $560 dollars due to copay for the medication at this time and is requesting samples. I mentioned pt applying for PA assistance/using copay/discount card which she mentioned using in the past. Pt has applied for PA in the past since she has been on medication for almost a yr and mentioned not meeting the requirement.  Pt has enough medication until tomorrow. We do have some samples available but this is not a long term resolution for future cost. I will forward to coumadin to see if we are able to give pt samples from their stand point as well.  Please advise pt for Eliquis cost may be too expensive.

## 2021-01-09 NOTE — Telephone Encounter (Signed)
Please advise if ok to give pt samples of Eliquis 2.5 mg bid.

## 2021-01-09 NOTE — Telephone Encounter (Signed)
Called and spoke w/pt and stated that they would only be charge $261 not 560 and that is because its a deductible that they must meet. Pam from [employer] set aside 2.'5mg'$  eliquis samples and a pt assistance form pt voiced gratitude and understanding

## 2021-01-09 NOTE — Telephone Encounter (Signed)
Patient assistance application completed and now needs documents and signature by patient. She is coming in tomorrow to bring forms and sign application. Will review with primary providers nurse for when she comes in tomorrow since I will be out of the office.

## 2021-01-09 NOTE — Telephone Encounter (Signed)
Thank you.  Pam

## 2021-01-09 NOTE — Telephone Encounter (Signed)
Patient assistance application and samples placed upfront for pick up.  Eliquis 2.5 mg tablet. Lot # [id number] Exp: 08/2021 2 boxes provided

## 2021-01-09 NOTE — Telephone Encounter (Signed)
Patient would like Eliquis samples.

## 2021-01-10 ENCOUNTER — Other Ambulatory Visit: Payer: Self-pay

## 2021-01-10 ENCOUNTER — Other Ambulatory Visit (INDEPENDENT_AMBULATORY_CARE_PROVIDER_SITE_OTHER): Payer: Medicare Other

## 2021-01-10 DIAGNOSIS — R7989 Other specified abnormal findings of blood chemistry: Secondary | ICD-10-CM | POA: Diagnosis not present

## 2021-01-10 LAB — TSH: TSH: 2.67 u[IU]/mL (ref 0.35–4.50)

## 2021-01-11 ENCOUNTER — Telehealth: Payer: No Typology Code available for payment source

## 2021-01-11 ENCOUNTER — Telehealth: Payer: Self-pay | Admitting: Pharmacist

## 2021-01-11 NOTE — Telephone Encounter (Signed)
Patient assistance application filled out samples ready for patient to pick up. We need the number of people living in the home and signature on application. We also need the following documents: 1. Copy of 1040 or social security statement 2. Out of pocket expense report from pharmacies 3. Copy of her new insurance card  Application placed in my file box under "Pending Applications" and note placed on bag of samples.

## 2021-01-11 NOTE — Telephone Encounter (Signed)
  Chronic Care Management   Note  01/11/2021 Name: Dawn Sherman MRN: [id number] DOB: Jun 19, 1956   Attempted to contact patient for scheduled appointment for medication management support. Left HIPAA compliant message for patient to return my call at their convenience.    Plan: - If I do not hear back from the patient by end of business today, will collaborate with Care Guide to outreach to schedule follow up with me   Catie Darnelle Maffucci, PharmD, [place], [place] Pharmacist [employer] at [employer] [phone]

## 2021-01-15 ENCOUNTER — Telehealth: Payer: Self-pay

## 2021-01-15 NOTE — Chronic Care Management (AMB) (Signed)
  Care Management   Note  01/15/2021 Name: ZAYLIN KEYLON MRN: [id number] DOB: 05/28/1956  [name] is a 65 y.o. year old female who is a primary care patient of [name], MD and is actively engaged with the care management team. I reached out to [name] by phone today to assist with re-scheduling a follow up visit with the Pharmacist  Follow up plan: Unsuccessful telephone outreach attempt made. A HIPAA compliant phone message was left for the patient providing contact information and requesting a return call.  The care management team will reach out to the patient again over the next 7 days.  If patient returns call to provider office, please advise to call [facility]  at [place], [place], [place], [place] [address] Direct Dial: [phone] [email or web address] Website: [email or web address]

## 2021-01-17 NOTE — Chronic Care Management (AMB) (Signed)
  Care Management   Note  01/17/2021 Name: Dawn Sherman MRN: [id number] DOB: Apr 16, 1956  Dawn Sherman is a 65 y.o. year old female who is a primary care patient of [name], MD and is actively engaged with the care management team. I reached out to Dawn Sherman by phone today to assist with re-scheduling a follow up visit with the Pharmacist  Follow up plan: Unsuccessful telephone outreach attempt made. A HIPAA compliant phone message was left for the patient providing contact information and requesting a return call.  The care management team will reach out to the patient again over the next 7 days.  If patient returns call to provider office, please advise to call [facility]  at [place], [place], [place], [place] [address] Direct Dial: [phone] [name]

## 2021-01-24 NOTE — Chronic Care Management (AMB) (Signed)
  Care Management   Note  01/24/2021 Name: TYYNE ROUNSVILLE MRN: [id number] DOB: March 15, 1956  Dawn Sherman is a 65 y.o. year old female who is a primary care patient of [name], MD and is actively engaged with the care management team. I reached out to Dawn Sherman by phone today to assist with re-scheduling a follow up visit with the Pharmacist  Follow up plan: Telephone appointment with care management team member scheduled for:03/02/2021  Noreene Larsson, [place], [place], [place] [address] Direct Dial: [phone] [email or web address] Website: [email or web address]

## 2021-01-24 NOTE — Telephone Encounter (Signed)
Patient has been rescheduled.

## 2021-02-07 ENCOUNTER — Ambulatory Visit (INDEPENDENT_AMBULATORY_CARE_PROVIDER_SITE_OTHER): Payer: Medicare Other | Admitting: Cardiovascular Disease

## 2021-02-07 ENCOUNTER — Other Ambulatory Visit: Payer: Self-pay

## 2021-02-07 ENCOUNTER — Encounter: Payer: Self-pay | Admitting: Cardiovascular Disease

## 2021-02-07 VITALS — BP 125/85 | HR 78 | Ht 65.0 in | Wt 120.5 lb

## 2021-02-07 DIAGNOSIS — J432 Centrilobular emphysema: Secondary | ICD-10-CM | POA: Diagnosis not present

## 2021-02-07 DIAGNOSIS — I480 Paroxysmal atrial fibrillation: Secondary | ICD-10-CM

## 2021-02-07 DIAGNOSIS — I5042 Chronic combined systolic (congestive) and diastolic (congestive) heart failure: Secondary | ICD-10-CM | POA: Diagnosis not present

## 2021-02-07 DIAGNOSIS — E7800 Pure hypercholesterolemia, unspecified: Secondary | ICD-10-CM

## 2021-02-07 DIAGNOSIS — N1832 Chronic kidney disease, stage 3b: Secondary | ICD-10-CM

## 2021-02-07 NOTE — Progress Notes (Signed)
Date:  02/07/2021   ID:  Dawn Sherman, DOB 07/16/1956, MRN [id number]  Patient Location:  [place] [address]   Provider location:   [facility], [place] office  PCP:  [name], MD  Cardiologist:  Patsy Baltimore   Chief Complaint  Patient presents with   5 month follow up     "Doing well." Medications reviewed by the patient verbally.      History of Present Illness:    Dawn Sherman is a 65 y.o. female  past medical history of smoker, prior alcohol,  chronic kidney disease,  hypertension,  hospital June 2021 with worsening PND orthopnea, congestive heart failure symptoms, " panic attack" Smoker Chronic kidney disease Who presents for follow-up of her diastolic and systolic CHF, PAF  Recent studies reviewed Echo 07/2020  1. Left ventricular ejection fraction, by estimation, is 50 to 55%. Left  ventricular ejection fraction by 3D volume is 54 %. The left ventricle has  low normal function. The left ventricle has no regional wall motion  abnormalities. Left ventricular  diastolic parameters are consistent with Grade I diastolic dysfunction  (impaired relaxation).   2. Right ventricular systolic function is normal. The right ventricular  size is normal.    hospital June 2021 Echocardiogram confirming ejection fraction 30%, global Unable to exclude hypertensive cardiomyopath In the setting of paroxysmal atrial fibrillation  pleural effusion on CT scan  In follow-up today reports that she feels well Smokes 4-5 a day Blood pressure at home typically 130s/75 Typically does not get any [id number] systolic numbers  Prior records reviewed hyperkalemia with K as high as 6.9 on 08/04/20 and her Delene Loll has had to be discontinued. ACE and ARB avoided  Active, takes care of her 66 year old and 66 year old grandchildren Denies any anginal symptoms  EKG personally reviewed by myself on todays visit Normal sinus rhythm rate 78 bpm  ST and T wave abnormality V3 through V6, inferior leads   Past Medical History:  Diagnosis Date   B12 deficiency    CHF (congestive heart failure) ([place])    CKD (chronic kidney disease), stage III ([place])    Depression    Endometriosis    Hypertension    Mixed hyperlipidemia    Tobacco abuse    Past Surgical History:  Procedure Laterality Date   ABDOMINAL HYSTERECTOMY     BREAST BIOPSY Right 2015   neg-  FIBROADENOMA   TAH/RSO  1999   secondary to bleeding and endometriosis (Dr Vernie Ammons)      Allergies:   Amoxicillin, Benicar [olmesartan], Codeine sulfate, Morphine and related, Penicillins, Sulfate, and Clindamycin/lincomycin   Social History   Tobacco Use   Smoking status: Every Day    Packs/day: 0.25    Years: 40.00    Pack years: 10.00    Types: Cigarettes   Smokeless tobacco: Never  Vaping Use   Vaping Use: Never used  Substance Use Topics   Alcohol use: Not Currently    Alcohol/week: 0.0 standard drinks    Comment: occasional   Drug use: No     Current Outpatient Medications on File Prior to Visit  Medication Sig Dispense Refill   acetaminophen (TYLENOL) 325 MG tablet Take 650 mg by mouth every 6 (six) hours as needed.     amLODipine (NORVASC) 10 MG tablet Take 1 tablet (10 mg total) by mouth daily. 90 tablet 2   apixaban (ELIQUIS) 2.5 MG TABS tablet Take 1 tablet (  2.5 mg total) by mouth 2 (two) times daily. 60 tablet 11   atorvastatin (LIPITOR) 40 MG tablet Take 40 mg by mouth daily.     buPROPion (WELLBUTRIN XL) 150 MG 24 hr tablet TAKE 1 TABLET BY MOUTH EVERY DAY 90 tablet 1   carvedilol (COREG) 25 MG tablet Take 1 tablet (25 mg total) by mouth 2 (two) times daily. 180 tablet 3   loratadine (CLARITIN) 10 MG tablet Take 10 mg by mouth daily.     omeprazole (PRILOSEC) 20 MG capsule Take 20 mg by mouth daily.     venlafaxine XR (EFFEXOR-XR) 150 MG 24 hr capsule TAKE 1 CAPSULE BY MOUTH EVERY DAY 90 capsule 1   No current facility-administered medications on  file prior to visit.     Family Hx: The patient's family history includes Hypercholesterolemia in her mother. There is no history of Breast cancer or Colon cancer.  ROS:   Please see the history of present illness.    Review of Systems  Constitutional: Negative.   HENT: Negative.    Respiratory: Negative.    Cardiovascular: Negative.   Gastrointestinal: Negative.   Musculoskeletal: Negative.   Neurological: Negative.   Psychiatric/Behavioral: Negative.    All other systems reviewed and are negative.   Labs/Other Tests and Data Reviewed:    Recent Labs: 06/16/2020: B Natriuretic Peptide 115.6 08/16/2020: Magnesium 1.9 11/21/2020: ALT 8; BUN 22; Creatinine, Ser 1.53; Hemoglobin 12.9; Platelets 242.0; Potassium 4.4; Sodium 136 01/10/2021: TSH 2.67   Recent Lipid Panel Lab Results  Component Value Date/Time   CHOL 219 (H) 11/21/2020 08:42 AM   TRIG 85.0 11/21/2020 08:42 AM   HDL 101.90 11/21/2020 08:42 AM   CHOLHDL 2 11/21/2020 08:42 AM   LDLCALC 100 (H) 11/21/2020 08:42 AM   LDLDIRECT 53.3 10/14/2012 08:21 AM    Wt Readings from Last 3 Encounters:  02/07/21 120 lb 8 oz (54.7 kg)  11/21/20 126 lb 3.2 oz (57.2 kg)  09/05/20 122 lb 8 oz (55.6 kg)     Exam:    Vital Signs: Vital signs may also be detailed in the HPI BP 125/85   Pulse 78   Ht '5\' 5"'$  (1.651 m)   Wt 120 lb 8 oz (54.7 kg)   SpO2 98%   BMI 20.05 kg/m   Constitutional:  oriented to person, place, and time. No distress.  HENT:  Head: Grossly normal Eyes:  no discharge. No scleral icterus.  Neck: No JVD, no carotid bruits  Cardiovascular: Regular rate and rhythm, no murmurs appreciated Pulmonary/Chest: Clear to auscultation bilaterally, no wheezes or rails Abdominal: Soft.  no distension.  no tenderness.  Musculoskeletal: Normal range of motion Neurological:  normal muscle tone. Coordination normal. 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 in the setting of hypertensive heart disease, atrial fibrillation -Did not tolerate ARB, Entresto secondary to hyperkalemia in the setting of renal failure Tolerating high-dose carvedilol, Started on amlodipine on last clinic visit with one of our providers, blood pressure reasonable As ejection fraction improved, asymptomatic, no medication changes made Outside recordings through provider office visits , some recent blood  pressure measurements in the 120s  Paroxysmal atrial fibrillation Eliquis 2.5 twice daily, renally dosed and weight less than 60 kg, carvedilol as above  Chronic kidney disease Stable creatinine 1.5  Hyperlipidemia We have stressed the importance of staying on her Lipitor   Total encounter time more than 25 minutes  Greater than 50% was spent in counseling and coordination of care with the patient    Signed, Ida Rogue, [place] [facility] [address], [place], [place] [address]

## 2021-02-07 NOTE — Patient Instructions (Signed)
Medication Instructions:  No changes  If you need a refill on your cardiac medications before your next appointment, please call your pharmacy.    Lab work: No new labs needed   If you have labs (blood work) drawn today and your tests are completely normal, you will receive your results only by: . MyChart Message (if you have MyChart) OR . A paper copy in the mail If you have any lab test that is abnormal or we need to change your treatment, we will call you to review the results.   Testing/Procedures: No new testing needed   Follow-Up: At [facility], you and your health needs are our priority.  As part of our continuing mission to provide you with exceptional heart care, we have created designated Provider Care Teams.  These Care Teams include your primary Cardiologist (physician) and Advanced Practice Providers (APPs -  Physician Assistants and Nurse Practitioners) who all work together to provide you with the care you need, when you need it.  . You will need a follow up appointment in 12 months  . Providers on your designated Care Team:   . Christopher Berge, NP . Ryan Dunn, PA-C . Jacquelyn Visser, PA-C  Any Other Special Instructions Will Be Listed Below (If Applicable).  COVID-19 Vaccine Information can be found at: [email or web address] For questions related to vaccine distribution or appointments, please email [email or web address] or call [phone].     

## 2021-02-12 ENCOUNTER — Other Ambulatory Visit: Payer: Self-pay | Admitting: Internal Medicine

## 2021-02-23 ENCOUNTER — Ambulatory Visit (INDEPENDENT_AMBULATORY_CARE_PROVIDER_SITE_OTHER): Payer: Medicare Other | Admitting: Internal Medicine

## 2021-02-23 ENCOUNTER — Other Ambulatory Visit: Payer: Self-pay

## 2021-02-23 ENCOUNTER — Encounter: Payer: Self-pay | Admitting: Internal Medicine

## 2021-02-23 VITALS — BP 130/82 | HR 85 | Temp 97.4°F | Ht 65.0 in | Wt 123.0 lb

## 2021-02-23 DIAGNOSIS — J432 Centrilobular emphysema: Secondary | ICD-10-CM

## 2021-02-23 DIAGNOSIS — I480 Paroxysmal atrial fibrillation: Secondary | ICD-10-CM

## 2021-02-23 DIAGNOSIS — Z0000 Encounter for general adult medical examination without abnormal findings: Secondary | ICD-10-CM | POA: Diagnosis not present

## 2021-02-23 DIAGNOSIS — I10 Essential (primary) hypertension: Secondary | ICD-10-CM

## 2021-02-23 DIAGNOSIS — E7800 Pure hypercholesterolemia, unspecified: Secondary | ICD-10-CM

## 2021-02-23 DIAGNOSIS — I5042 Chronic combined systolic (congestive) and diastolic (congestive) heart failure: Secondary | ICD-10-CM | POA: Diagnosis not present

## 2021-02-23 DIAGNOSIS — F32A Depression, unspecified: Secondary | ICD-10-CM

## 2021-02-23 DIAGNOSIS — F1011 Alcohol abuse, in remission: Secondary | ICD-10-CM

## 2021-02-23 DIAGNOSIS — R739 Hyperglycemia, unspecified: Secondary | ICD-10-CM | POA: Diagnosis not present

## 2021-02-23 DIAGNOSIS — N1832 Chronic kidney disease, stage 3b: Secondary | ICD-10-CM

## 2021-02-23 DIAGNOSIS — K219 Gastro-esophageal reflux disease without esophagitis: Secondary | ICD-10-CM

## 2021-02-23 DIAGNOSIS — F320 Major depressive disorder, single episode, mild: Secondary | ICD-10-CM

## 2021-02-23 LAB — BASIC METABOLIC PANEL
BUN: 24 mg/dL — ABNORMAL HIGH (ref 6–23)
CO2: 25 mEq/L (ref 19–32)
Calcium: 9.1 mg/dL (ref 8.4–10.5)
Chloride: 101 mEq/L (ref 96–112)
Creatinine, Ser: 1.49 mg/dL — ABNORMAL HIGH (ref 0.40–1.20)
GFR: 36.58 mL/min — ABNORMAL LOW (ref >60.00)
Glucose, Bld: 78 mg/dL (ref 70–99)
Potassium: 4.9 mEq/L (ref 3.5–5.1)
Sodium: 135 mEq/L (ref 135–145)

## 2021-02-23 LAB — LIPID PANEL
Cholesterol: 180 mg/dL (ref 0–200)
HDL: 76.1 mg/dL (ref >39.00)
LDL Cholesterol: 85 mg/dL (ref 0–99)
NonHDL: 103.83
Total CHOL/HDL Ratio: 2
Triglycerides: 95 mg/dL (ref 0.0–149.0)
VLDL: 19 mg/dL (ref 0.0–40.0)

## 2021-02-23 LAB — HEPATIC FUNCTION PANEL
ALT: 10 U/L (ref 0–35)
AST: 14 U/L (ref 0–37)
Albumin: 4.2 g/dL (ref 3.5–5.2)
Alkaline Phosphatase: 98 U/L (ref 39–117)
Bilirubin, Direct: 0.1 mg/dL (ref 0.0–0.3)
Total Bilirubin: 0.3 mg/dL (ref 0.2–1.2)
Total Protein: 6.6 g/dL (ref 6.0–8.3)

## 2021-02-23 LAB — TSH: TSH: 2.9 u[IU]/mL (ref 0.35–5.50)

## 2021-02-23 LAB — HEMOGLOBIN A1C: Hgb A1c MFr Bld: 6.5 % (ref 4.6–6.5)

## 2021-02-23 NOTE — Assessment & Plan Note (Addendum)
Physical today 02/23/21.  Colonoscopy 12/2018 - normal.  Recommended f/u in 10 years.  PAP 02/2019 - negative with negative HPV.  Mammogram 12/18/20 - Birads II.

## 2021-02-23 NOTE — Progress Notes (Signed)
Patient ID: Dawn Sherman, female   DOB: 09-25-1955, 65 y.o.   MRN: [id number]   Subjective:    Patient ID: Dawn Sherman, female    DOB: 03/22/1956, 65 y.o.   MRN: [id number]  HPI This visit occurred during the SARS-CoV-2 public health emergency.  Safety protocols were in place, including screening questions prior to the visit, additional usage of staff PPE, and extensive cleaning of exam room while observing appropriate contact time as indicated for disinfecting solutions.   Patient with past history of CHF, CKD, hypertension and hypercholesterolemia.  She comes in today to follow up on these issues as well as for a complete physical exam.  She reports she is doing well.  Staying active.  No chest pain tightness or shortness of breath with increased activity or exertion.  Saw Dr. Rockey Situ June 2022.  Stable.  No abdominal pain or cramping.  No urine or bowel changes reported.  Had colonoscopy May 2020.  Recommended follow-up in 10 years.  Handling stress.  Stress is actually better.  Past Medical History:  Diagnosis Date   B12 deficiency    CHF (congestive heart failure) (HCC)    CKD (chronic kidney disease), stage III (HCC)    Depression    Endometriosis    Hypertension    Mixed hyperlipidemia    Tobacco abuse    Past Surgical History:  Procedure Laterality Date   ABDOMINAL HYSTERECTOMY     BREAST BIOPSY Right 2015   neg-  FIBROADENOMA   TAH/RSO  1999   secondary to bleeding and endometriosis (Dr Vernie Ammons)   Family History  Problem Relation Age of Onset   Hypercholesterolemia Mother    Breast cancer Neg Hx    Colon cancer Neg Hx    Social History   Socioeconomic History   Marital status: Married    Spouse name: Not on file   Number of children: Not on file   Years of education: Not on file   Highest education level: Not on file  Occupational History   Not on file  Tobacco Use   Smoking status: Every Day    Packs/day: 0.25    Years: 40.00    Pack years: 10.00     Types: Cigarettes   Smokeless tobacco: Never  Vaping Use   Vaping Use: Never used  Substance and Sexual Activity   Alcohol use: Not Currently    Alcohol/week: 0.0 standard drinks    Comment: occasional   Drug use: No   Sexual activity: Not Currently  Other Topics Concern   Not on file  Social History Narrative   Lives in [place] with her husband.  She is retired from [employer].  She does not routinely exercise.   Social Determinants of Health   Financial Resource Strain: Low Risk    Difficulty of Paying Living Expenses: Not very hard  Food Insecurity: Not on file  Transportation Needs: Not on file  Physical Activity: Not on file  Stress: No Stress Concern Present   Feeling of Stress : Only a little  Social Connections: Not on file    Review of Systems  Constitutional:  Negative for appetite change and unexpected weight change.  HENT:  Negative for congestion, sinus pressure and sore throat.   Eyes:  Negative for pain and visual disturbance.  Respiratory:  Negative for cough, chest tightness and shortness of breath.   Cardiovascular:  Negative for chest pain, palpitations and leg swelling.  Gastrointestinal:  Negative for abdominal pain,  diarrhea, nausea and vomiting.  Genitourinary:  Negative for difficulty urinating and dysuria.  Musculoskeletal:  Negative for joint swelling and myalgias.  Skin:  Negative for color change and rash.  Neurological:  Negative for dizziness, light-headedness and headaches.  Hematological:  Negative for adenopathy. Does not bruise/bleed easily.  Psychiatric/Behavioral:  Negative for agitation and dysphoric mood.       Objective:    Physical Exam Vitals reviewed.  Constitutional:      General: She is not in acute distress.    Appearance: Normal appearance. She is well-developed.  HENT:     Head: Normocephalic and atraumatic.     Right Ear: External ear normal.     Left Ear: External ear normal.  Eyes:     General: No  scleral icterus.       Right eye: No discharge.        Left eye: No discharge.     Conjunctiva/sclera: Conjunctivae normal.  Neck:     Thyroid: No thyromegaly.  Cardiovascular:     Rate and Rhythm: Normal rate and regular rhythm.  Pulmonary:     Effort: No tachypnea, accessory muscle usage or respiratory distress.     Breath sounds: Normal breath sounds. No decreased breath sounds, wheezing or rhonchi.  Chest:  Breasts:    Right: No inverted nipple, mass, nipple discharge or tenderness (no axillary adenopathy).     Left: No inverted nipple, mass, nipple discharge or tenderness (no axilarry adenopathy).  Abdominal:     General: Bowel sounds are normal.     Palpations: Abdomen is soft.     Tenderness: There is no abdominal tenderness.  Musculoskeletal:        General: No swelling or tenderness.     Cervical back: Neck supple.  Lymphadenopathy:     Cervical: No cervical adenopathy.  Skin:    Findings: No erythema or rash.  Neurological:     Mental Status: She is alert and oriented to person, place, and time.  Psychiatric:        Mood and Affect: Mood normal.        Behavior: Behavior normal.    BP 130/82   Pulse 85   Temp (!) 97.4 F (36.3 C)   Ht _0  (1.651 m)   Wt 123 lb (55.8 kg)   SpO2 95%   BMI 20.47 kg/m  Wt Readings from Last 3 Encounters:  02/23/21 123 lb (55.8 kg)  02/07/21 120 lb 8 oz (54.7 kg)  11/21/20 126 lb 3.2 oz (57.2 kg)    Outpatient Encounter Medications as of 02/23/2021  Medication Sig   acetaminophen (TYLENOL) 325 MG tablet Take 650 mg by mouth every 6 (six) hours as needed.   amLODipine (NORVASC) 10 MG tablet Take 1 tablet (10 mg total) by mouth daily.   apixaban (ELIQUIS) 2.5 MG TABS tablet Take 1 tablet (2.5 mg total) by mouth 2 (two) times daily.   atorvastatin (LIPITOR) 40 MG tablet TAKE 1 TABLET BY MOUTH EVERY DAY   buPROPion (WELLBUTRIN XL) 150 MG 24 hr tablet TAKE 1 TABLET BY MOUTH EVERY DAY   carvedilol (COREG) 25 MG tablet Take 1  tablet (25 mg total) by mouth 2 (two) times daily.   loratadine (CLARITIN) 10 MG tablet Take 10 mg by mouth daily.   omeprazole (PRILOSEC) 20 MG capsule Take 20 mg by mouth daily.   venlafaxine XR (EFFEXOR-XR) 150 MG 24 hr capsule TAKE 1 CAPSULE BY MOUTH EVERY DAY   No facility-administered encounter medications  on file as of 02/23/2021.     Lab Results  Component Value Date   WBC 8.9 11/21/2020   HGB 12.9 11/21/2020   HCT 39.1 11/21/2020   PLT 242.0 11/21/2020   GLUCOSE 78 02/23/2021   CHOL 180 02/23/2021   TRIG 95.0 02/23/2021   HDL 76.10 02/23/2021   LDLDIRECT 53.3 10/14/2012   LDLCALC 85 02/23/2021   ALT 10 02/23/2021   AST 14 02/23/2021   NA 135 02/23/2021   K 4.9 02/23/2021   CL 101 02/23/2021   CREATININE 1.49 (H) 02/23/2021   BUN 24 (H) 02/23/2021   CO2 25 02/23/2021   TSH 2.90 02/23/2021   INR 1.0 12/13/2013   HGBA1C 6.5 02/23/2021    MM DIAG BREAST TOMO BILATERAL  Result Date: 12/18/2020 CLINICAL DATA:  Short-term follow-up for probably benign right breast calcifications, initially assessed in February 2020. EXAM: DIGITAL DIAGNOSTIC BILATERAL MAMMOGRAM WITH TOMOSYNTHESIS AND CAD TECHNIQUE: Bilateral digital diagnostic mammography and breast tomosynthesis was performed. The images were evaluated with computer-aided detection. COMPARISON:  Previous exam(s). ACR Breast Density Category d: The breast tissue is extremely dense, which lowers the sensitivity of mammography. FINDINGS: Calcifications in the upper right breast are stable. There are no defined masses, areas of architectural distortion, areas of significant asymmetry or new calcifications. IMPRESSION: 1. No evidence of breast malignancy. 2. Benign right breast calcifications stable for over 2 years. RECOMMENDATION: Screening mammogram in one year.(Code:[id number]-B-01Y) I have discussed the findings and recommendations with the patient. If applicable, a reminder letter will be sent to the patient regarding the next  appointment. BI-RADS CATEGORY  2: Benign. Electronically Signed   By: Lajean Manes M.D.   On: 12/18/2020 11:05       Assessment & Plan:   Problem List Items Addressed This Visit     Chronic combined systolic and diastolic CHF (congestive heart failure) ([place])    Off entresto due to hyperkalemia.  Has done well off lasix.  Continue coreg and eliquis.  No evidence of volume overload.  Follow.        Relevant Orders   Basic metabolic panel (Completed)   Chronic obstructive pulmonary disease (HCC)    Breathing stable.  Follow.        CKD (chronic kidney disease) stage 3, GFR 30-59 ml/min (HCC)    Continue to avoid antiinflammatories.  Has been evaluated by nephrology.         GERD (gastroesophageal reflux disease)    No upper symptoms reported.  On prilosec.        Health care maintenance    Physical today 02/23/21.  Colonoscopy 12/2018 - normal.  Recommended f/u in 10 years.  PAP 02/2019 - negative with negative HPV.  Mammogram 12/18/20 - Birads II.        History of alcohol abuse    Has quit drinking.  Denies any alcohol intake.  Follow.        Hypercholesterolemia - Primary    Continue lipitor.  Low cholesterol diet and exercise.  Follow lipid panel and liver function tests.         Relevant Orders   Lipid panel (Completed)   TSH (Completed)   Hepatic function panel (Completed)   Hyperglycemia    Low carb diet and exercise.  Follow met b and a1c.        Relevant Orders   Hemoglobin A1c (Completed)   Hypertension    Off entresto due to hyperkalemia.  Has done well off lasix.  Continue coreg and  eliquis.  Follow pressures.  Follow metabolic panel.        Mild depression ([place])    Doing well on effexor and wellbutrin.        Paroxysmal A-fib (HCC)    Documented paroxysmal afib.  Continue eliquis and coreg.  No increased heart rate or palpitations. Appears to be in SR.  Follow.          [name], MD

## 2021-02-24 ENCOUNTER — Encounter: Payer: Self-pay | Admitting: Internal Medicine

## 2021-02-24 NOTE — Assessment & Plan Note (Signed)
Continue to avoid antiinflammatories.  Has been evaluated by nephrology.

## 2021-02-24 NOTE — Assessment & Plan Note (Signed)
Has quit drinking.  Denies any alcohol intake.  Follow.

## 2021-02-24 NOTE — Assessment & Plan Note (Signed)
Low carb diet and exercise.  Follow met b and a1c.  

## 2021-02-24 NOTE — Assessment & Plan Note (Signed)
Off entresto due to hyperkalemia.  Has done well off lasix.  Continue coreg and eliquis.  Follow pressures.  Follow metabolic panel.

## 2021-02-24 NOTE — Assessment & Plan Note (Signed)
Doing well on effexor and wellbutrin.

## 2021-02-24 NOTE — Assessment & Plan Note (Signed)
Documented paroxysmal afib.  Continue eliquis and coreg.  No increased heart rate or palpitations. Appears to be in SR.  Follow.

## 2021-02-24 NOTE — Assessment & Plan Note (Signed)
Continue lipitor.  Low cholesterol diet and exercise.  Follow lipid panel and liver function tests.   

## 2021-02-24 NOTE — Assessment & Plan Note (Signed)
Off entresto due to hyperkalemia.  Has done well off lasix.  Continue coreg and eliquis.  No evidence of volume overload.  Follow.

## 2021-02-24 NOTE — Assessment & Plan Note (Signed)
Breathing stable.  Follow.    

## 2021-02-24 NOTE — Assessment & Plan Note (Signed)
No upper symptoms reported. On prilosec.  

## 2021-03-02 ENCOUNTER — Ambulatory Visit (INDEPENDENT_AMBULATORY_CARE_PROVIDER_SITE_OTHER): Payer: Medicare Other | Admitting: Pharmacist

## 2021-03-02 DIAGNOSIS — E7800 Pure hypercholesterolemia, unspecified: Secondary | ICD-10-CM

## 2021-03-02 DIAGNOSIS — I480 Paroxysmal atrial fibrillation: Secondary | ICD-10-CM

## 2021-03-02 DIAGNOSIS — I5042 Chronic combined systolic (congestive) and diastolic (congestive) heart failure: Secondary | ICD-10-CM | POA: Diagnosis not present

## 2021-03-02 DIAGNOSIS — I10 Essential (primary) hypertension: Secondary | ICD-10-CM

## 2021-03-02 DIAGNOSIS — J432 Centrilobular emphysema: Secondary | ICD-10-CM | POA: Diagnosis not present

## 2021-03-02 DIAGNOSIS — N1832 Chronic kidney disease, stage 3b: Secondary | ICD-10-CM

## 2021-03-02 NOTE — Chronic Care Management (AMB) (Signed)
Chronic Care Management Pharmacy Note  03/02/2021 Name:  Dawn Sherman MRN:  [id number] DOB:  24-Jan-1956  Subjective: Dawn Sherman is an 65 y.o. year old female who is a primary patient of [name], MD.  The CCM team was consulted for assistance with disease management and care coordination needs.    Engaged with patient by telephone for follow up visit in response to provider referral for pharmacy case management and/or care coordination services.   Consent to Services:  The patient was given information about Chronic Care Management services, agreed to services, and gave verbal consent prior to initiation of services.  Please see initial visit note for detailed documentation.   Patient Care Team: [name], MD as PCP - General (Internal Medicine) Minna Merritts, MD as PCP - Cardiology (Cardiology) Bary Castilla [place] Gleason, MD as Consulting Physician (General Surgery)  Recent office visits: 3/29 -  PCP f/u - cut back smoking, 7/1 - PCP  - A1c 6.5%, LDL 85, eGFR36  Recent consult visits: 5/17 - collaboration with [facility] clinic regarding patient assistance 6/15 - cardiology [place] - continue current regimen  Hospital visits: None in previous 6 months  Objective:  Lab Results  Component Value Date   CREATININE 1.49 (H) 02/23/2021   CREATININE 1.53 (H) 11/21/2020   CREATININE 1.61 (H) 09/05/2020    Lab Results  Component Value Date   HGBA1C 6.5 02/23/2021   Last diabetic Eye exam: No results found for: HMDIABEYEEXA  Last diabetic Foot exam: No results found for: HMDIABFOOTEX      Component Value Date/Time   CHOL 180 02/23/2021 1210   TRIG 95.0 02/23/2021 1210   HDL 76.10 02/23/2021 1210   CHOLHDL 2 02/23/2021 1210   VLDL 19.0 02/23/2021 1210   LDLCALC 85 02/23/2021 1210   LDLDIRECT 53.3 10/14/2012 0821    Hepatic Function Latest Ref Rng & Units 02/23/2021 11/21/2020 06/15/2020  Total Protein 6.0 - 8.3 g/dL 6.6 6.5 7.5  Albumin 3.5 - 5.2 g/dL 4.2 3.9 4.2   AST 0 - 37 U/L '14 14 16  ' ALT 0 - 35 U/L '10 8 12  ' Alk Phosphatase 39 - 117 U/L 98 91 85  Total Bilirubin 0.2 - 1.2 mg/dL 0.3 0.3 0.8  Bilirubin, Direct 0.0 - 0.3 mg/dL 0.1 0.1 -    Lab Results  Component Value Date/Time   TSH 2.90 02/23/2021 12:10 PM   TSH 2.67 01/10/2021 09:09 AM    CBC Latest Ref Rng & Units 11/21/2020 08/04/2020 06/18/2020  WBC 4.0 - 10.5 K/uL 8.9 12.6(H) 12.4(H)  Hemoglobin 12.0 - 15.0 g/dL 12.9 12.2 11.3(L)  Hematocrit 36.0 - 46.0 % 39.1 37.9 32.9(L)  Platelets 150.0 - 400.0 K/uL 242.0 206 80(L)    No results found for: VD25OH  Clinical ASCVD: No  The 10-year ASCVD risk score Mikey Bussing DC Jr., et al., 2013) is: 11.5%   Values used to calculate the score:     Age: 49 years     Sex: Female     Is Non-Hispanic African American: No     Diabetic: No     Tobacco smoker: Yes     Systolic Blood Pressure: 914 mmHg     Is BP treated: Yes     HDL Cholesterol: 76.1 mg/dL     Total Cholesterol: 180 mg/dL     CHA2DS2-VASc Score = 4  This indicates a 4.8% annual risk of stroke. The patient's score is based upon: CHF History: Yes HTN History: Yes Diabetes History: No  Stroke History: No Vascular Disease History: No Age Score: 1 Gender Score: 1    Social History   Tobacco Use  Smoking Status Every Day   Packs/day: 0.25   Years: 40.00   Pack years: 10.00   Types: Cigarettes  Smokeless Tobacco Never   BP Readings from Last 3 Encounters:  02/23/21 130/82  02/07/21 125/85  11/21/20 122/84   Pulse Readings from Last 3 Encounters:  02/23/21 85  02/07/21 78  11/21/20 83   Wt Readings from Last 3 Encounters:  02/23/21 123 lb (55.8 kg)  02/07/21 120 lb 8 oz (54.7 kg)  11/21/20 126 lb 3.2 oz (57.2 kg)    Assessment: Review of patient past medical history, allergies, medications, health status, including review of consultants reports, laboratory and other test data, was performed as part of comprehensive evaluation and provision of chronic care  management services.   SDOH:  (Social Determinants of Health) assessments and interventions performed:  SDOH Interventions    Flowsheet Row Most Recent Value  SDOH Interventions   Financial Strain Interventions Other (Comment)  [over income for [place]  Allergies  Allergen Reactions   Amoxicillin    Benicar [Olmesartan]     Talked with patient February 10, 2020, intolerance is unclear, tried several medications around that time and one of them gave her a rash but she is not clear which 1.   Codeine Sulfate    Morphine And Related    Penicillins    Sulfate    Clindamycin/Lincomycin Rash    Medications Reviewed Today     Reviewed by De Hollingshead, RPH-CPP (Pharmacist) on 03/02/21 at [place] List Status: <None>   Medication Order Taking? Sig Documenting Provider Last Dose Status Informant  acetaminophen (TYLENOL) 325 MG tablet [id number] Yes Take 650 mg by mouth every 6 (six) hours as needed. [provider] Taking Active Self  amLODipine (NORVASC) 10 MG tablet [id number] Yes Take 1 tablet (10 mg total) by mouth daily. Loel Dubonnet, NP Taking Active   apixaban (ELIQUIS) 2.5 MG TABS tablet [id number] Yes Take 1 tablet (2.5 mg total) by mouth 2 (two) times daily. Loel Dubonnet, NP Taking Active Self           Med Note Nat Christen Aug 31, 2020 11:12 AM)    atorvastatin (LIPITOR) 40 MG tablet [id number] Yes TAKE 1 TABLET BY MOUTH EVERY DAY [name], MD Taking Active   buPROPion (WELLBUTRIN XL) 150 MG 24 hr tablet [id number] Yes TAKE 1 TABLET BY MOUTH EVERY DAY [name], MD Taking Active   carvedilol (COREG) 25 MG tablet [id number] Yes Take 1 tablet (25 mg total) by mouth 2 (two) times daily. Loel Dubonnet, NP Taking Active            Med Note Darnelle Maffucci, Maralyn Sago Aug 31, 2020 11:13 AM)    loratadine (CLARITIN) 10 MG tablet [id number] Yes Take 10 mg by mouth daily. [provider] Taking Active Self   omeprazole (PRILOSEC) 20 MG capsule [id number] Yes Take 20 mg by mouth daily. [provider] Taking Active Self  venlafaxine XR (EFFEXOR-XR) 150 MG 24 hr capsule [id number] Yes TAKE 1 CAPSULE BY MOUTH EVERY DAY [name], MD Taking Active             Patient Active Problem List   Diagnosis Date Noted   Hyperglycemia 02/23/2021   Chronic obstructive pulmonary disease (  [place]) 11/22/2020   CKD (chronic kidney disease) stage 3, GFR 30-59 ml/min (HCC) 08/12/2020   Paroxysmal A-fib (HCC) 08/12/2020   Nausea and vomiting 06/17/2020   Dehydration    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HCC)    Nausea vomiting and diarrhea 06/16/2020   Chronic combined systolic and diastolic CHF (congestive heart failure) ([place]) 02/10/2020   Leukocytosis 02/10/2020   HLD (hyperlipidemia) 02/10/2020   Elevated troponin 02/10/2020   GERD (gastroesophageal reflux disease) 02/10/2020   Abnormal mammogram 07/13/2019   Sleep concern 10/17/2018   History of alcohol abuse 07/31/2018   B12 deficiency 07/31/2018   Acute renal failure superimposed on stage 3b chronic kidney disease ([place]) 11/03/2016   Health care maintenance 12/02/2014   Tobacco abuse 12/02/2014   Foot pain, left 11/29/2014   Hypercholesterolemia 11/29/2014   Encounter for screening colonoscopy 12/16/2013   Breast mass 12/15/2012   Hypertension 07/05/2012   Mild depression ([place]) 07/05/2012    Immunization History  Administered Date(s) Administered   Influenza Inj Mdck Quad With Preservative 08/05/2019   Influenza,inj,Quad PF,6+ Mos 07/02/2018   Influenza-Unspecified 06/22/2020   PFIZER(Purple Top)SARS-COV-2 Vaccination 03/19/2020, 04/09/2020   Pneumococcal Polysaccharide-23 02/13/2020    Conditions to be addressed/monitored: Atrial Fibrillation, HTN, HLD, and DMII  Care Plan : Medication Management  Updates made by De Hollingshead, RPH-CPP since 03/02/2021 12:00 AM     Problem: Atrial Fibrillation, Heart Failure,  Prediabetes, HLD, HTN      Long-Range Goal: Disease Progression Prevention   This Visit's Progress: On track  Recent Progress: On track  Priority: High  Note:   Current Barriers:  Unable to independently afford treatment regimen Does not adhere to prescribed medication regimen  Pharmacist Clinical Goal(s):  Over the next 90 days, patient will verbalize ability to afford treatment regimen Over the next 90 days, patient will  achieve adherence to monitoring guidelines and medication adherence to achieve therapeutic efficacy  Interventions: 1:1 collaboration with [name], MD regarding development and update of comprehensive plan of care as evidenced by provider attestation and co-signature Inter-disciplinary care team collaboration (see longitudinal plan of care) Comprehensive medication review performed; medication list updated in electronic medical record  Health Maintenance: Discussed Shingrix, Tdap. Patient is going to the pharmacy today and will discuss with them DEXA ordered but does not appear it was scheduled. Collaborated w/ referral staff to determine next steps. Patient needs to call [place] to schedule  Still needs to schedule f/u with nephrology, as patient is overdue. Reminded.   HFpEF (recovered); most recent EF 50-55%: Appropriately managed; current treatment: carvedilol 25 mg BID, amlodipine 10 mg daily; follows w/ Dr. Antonietta Barcelona d/c d/t hyperkalemia. Avoiding ACEi/ARB, MRA due to this Furosemide d/c d/t renal fx  Home readings: 120-130s/80;  Home weights: weighing regularly; 123 lbs  Denies any s/sx hypotension. No LEE.  Recommended to continue current regimen at this time.    Atrial Fibrillation: Appropriately managed; current rate control: carvedilol 25 mg BID; anticoagulant treatment: Eliquis 2.5 mg BID (appropriately renally reduced d/t Scr >1.5, weight <60 kg) Over income for Eliquis patient assistance per patient report.  Recommended to  continue current regimen at this time   Hyperlipidemia: Controlled per last lab work at goal <100; current treatment: atorvastatin 40 mg daily No antiplatelet given anticoagulant Previously recommended to continue current regimen at this time. Encouraged adherence.   Depression/Anxiety: Controlled per patient report; current treatment: venlafaxine XR 150 mg daily, bupropion XL 150 mg QAM  Managing stress well per her report Encouraged continued self care. Recommended  to continue current regimen.    GERD, s/p hospitalization for gastroenteritis: Controlled per patient report; current regimen: omeprazole 20 mg daily Recommended to continue current regimen at this time  Tobacco Abuse: ~0.25 packs per day; 40 years of use; 2-5 cigarettes per day.  Typical tobacco patterns: First waking up with first cup of coffee, after each meal  Not interested in further reduction at this time.  Previous quit attempts: unsuccessful using nicotine patches - notes she had a headache. Unsure what dose she was using.  Triggers to smoke: Stress.  Motivation to quit smoking: health Other stress relievers are staying busy, taking care of people around her, word puzzles, cooking. Again reviewed potential benefit of nicotine replacement therapy or varenicline. Patient declines at this time. Reiterated the importance of continuing to focus on reducing and eventually quitting.   Patient Goals/Self-Care Activities Over the next 90 days, patient will:  - take medications as prescribed focus on medication adherence by using  pill box check blood pressure daily, document, and provide at future appointments weigh daily, and contact provider if weight gain of >3 lbs in a day or 5 lbs in a week - Commit to reduction of tobacco and assess readiness to quit  Follow Up Plan: Telephone follow up appointment with care management team member scheduled for: ~ 4 months      Medication Assistance: None required.  Patient  affirms current coverage meets needs.  Patient's preferred pharmacy is:  [id number] [place] [id number]: [phone]: [phone] Follow Up:  Patient agrees to Care Plan and Follow-up.  Plan: Telephone follow up appointment with care management team member scheduled for:  ~ 4 months  Catie TDarnelle Maffucci PharmD, BIngenio CQuanticoClinical Pharmacist LOccidental Petroleumat BJohnson & [phone]

## 2021-03-02 NOTE — Patient Instructions (Addendum)
Dawn Sherman,   It was great talking with you today!  1) Call [facility] to schedule your bone density exam - [phone] 2) Call [facility] to schedule follow up with the kidney doctor, Dr. [name]- [phone] 3) Talk with your community pharmacist about the Tdap vaccine and the Shingrix vaccine.   Take care!  Catie Darnelle Maffucci, PharmD [phone]   Visit Information  PATIENT GOALS:  Goals Addressed               This Visit's Progress     Patient Stated     Medication Monitoring (pt-stated)         Patient Goals/Self-Care Activities Over the next 90 days, patient will:  - take medications as prescribed focus on medication adherence by using  pill box check blood pressure daily, document, and provide at future appointments weigh daily, and contact provider if weight gain of >3 lbs in a day or 5 lbs in a week - Commit to reduction of tobacco and assess readiness to quit          The patient verbalized understanding of instructions, educational materials, and care plan provided today and agreed to receive a mailed copy of patient instructions, educational materials, and care plan.   Plan: Telephone follow up appointment with care management team member scheduled for:  ~ 4 months   Catie Darnelle Maffucci, PharmD, [place], [place] Clinical Pharmacist [employer] [facility] [employer] [phone]

## 2021-05-13 IMAGING — CT CT ANGIO CHEST
2 of 6 series · 19 of 46 positions shown · IV contrast (omnipaque)
Comparison: None.

CLINICAL DATA: Hypoxemia

EXAM:
CT ANGIOGRAPHY CHEST WITH CONTRAST
TECHNIQUE: Multidetector CT imaging of the chest was performed using the
standard protocol during bolus administration of intravenous
contrast. Multiplanar CT image reconstructions and MIPs were
obtained to evaluate the vascular anatomy.
CONTRAST:  60mL OMNIPAQUE IOHEXOL 350 MG/ML SOLN

[Series 5: thins · axial · 0.62mm/px · z∈[-512,-244]mm · 16 of 295 slices shown]
[im 13/295  lung]
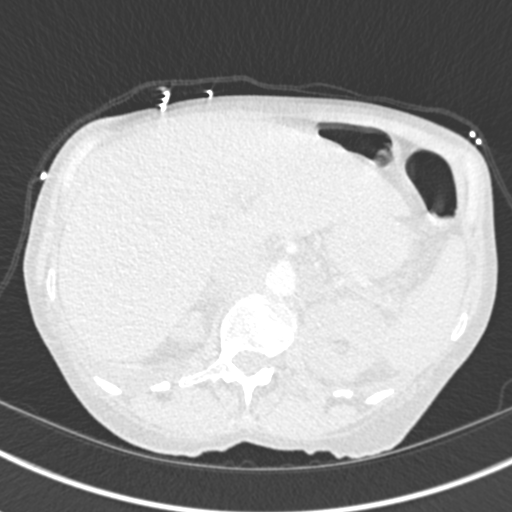
[im 39/295  soft-tissue]
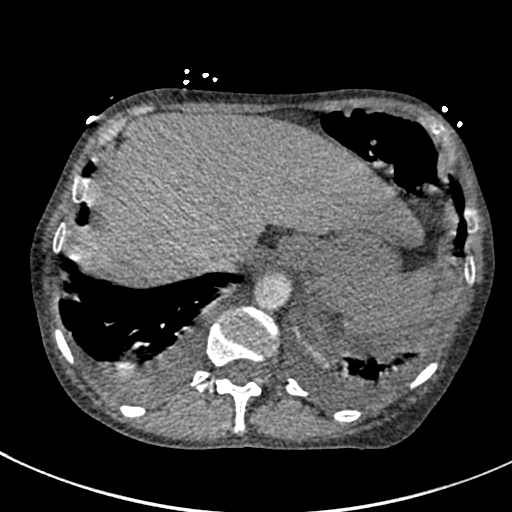
[im 52/295  lung]
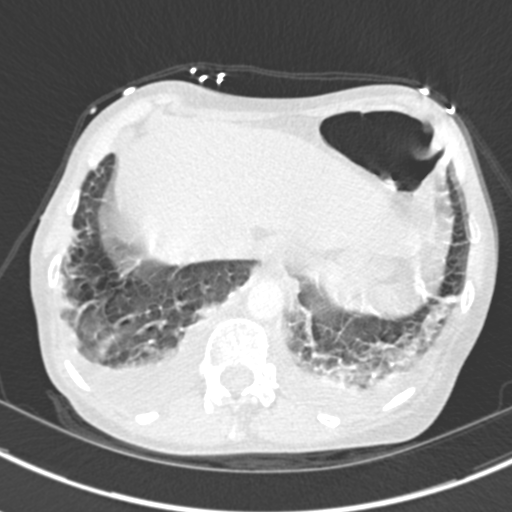
[im 64/295  soft-tissue]
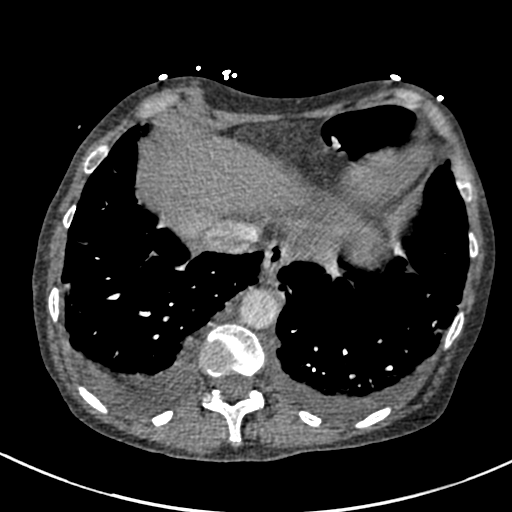
[im 90/295  lung]
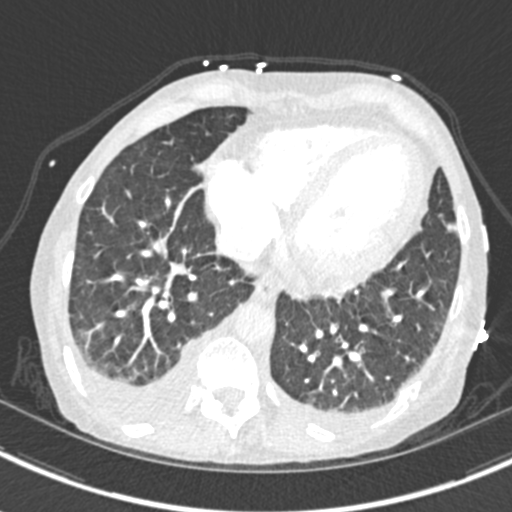
[im 103/295  soft-tissue]
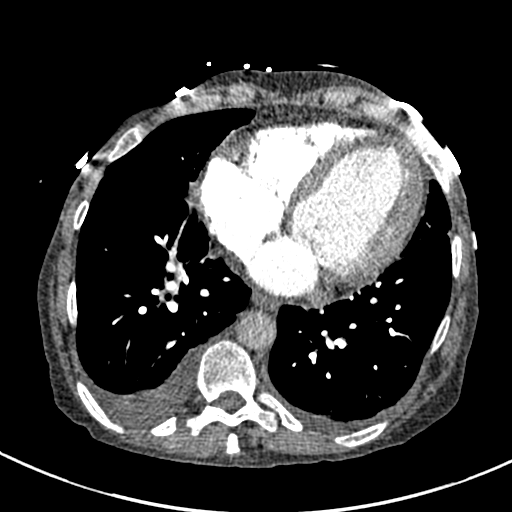
[im 116/295  lung]
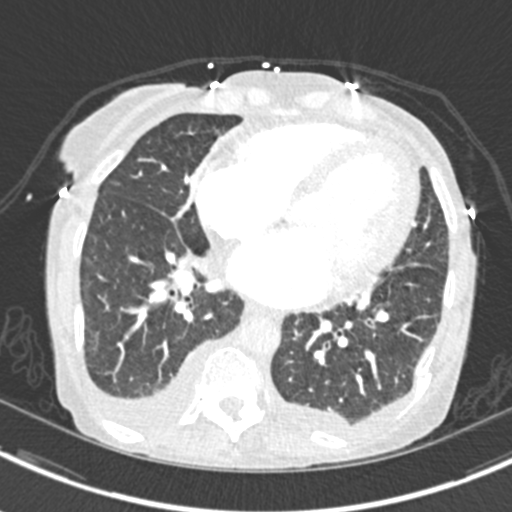
[im 141/295  soft-tissue]
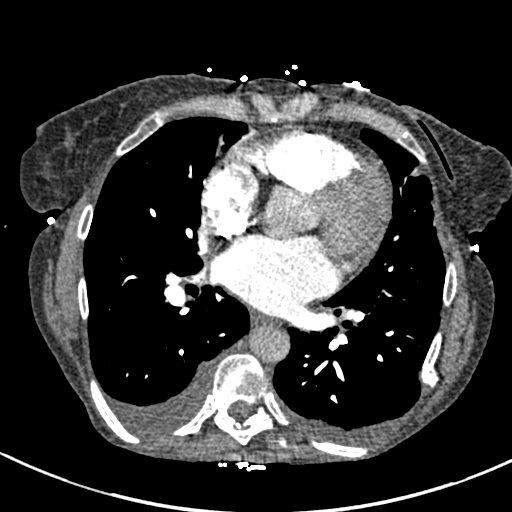
[im 154/295  lung]
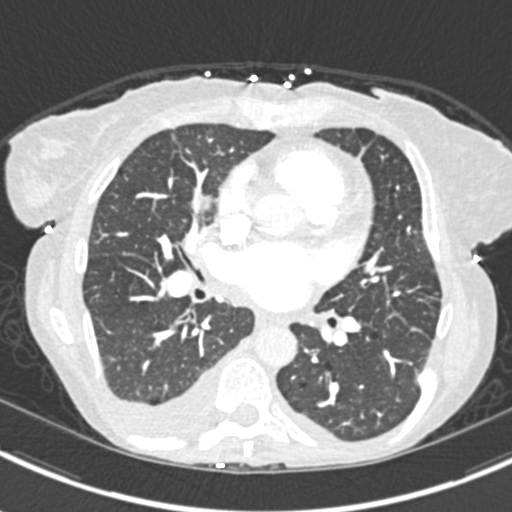
[im 179/295  soft-tissue]
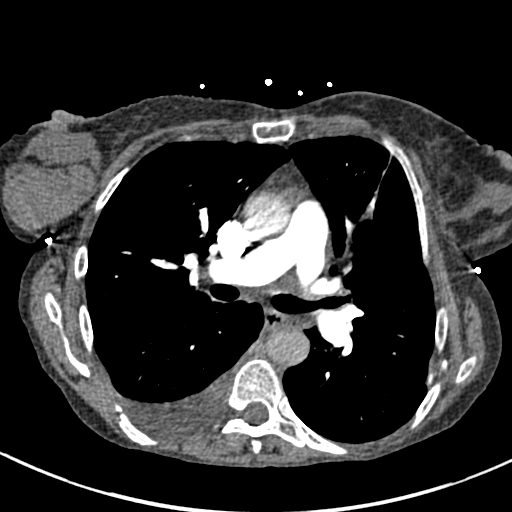
[im 192/295  lung]
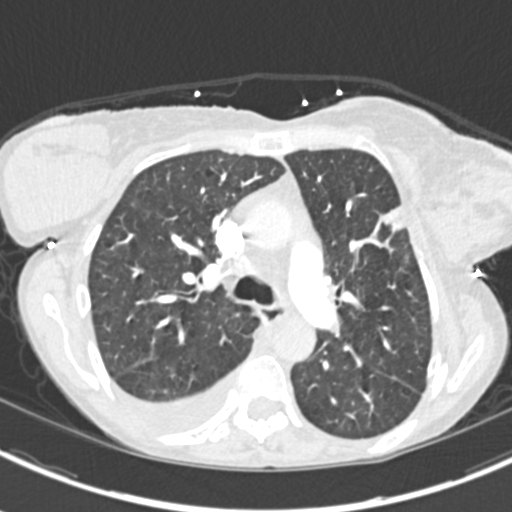
[im 205/295  soft-tissue]
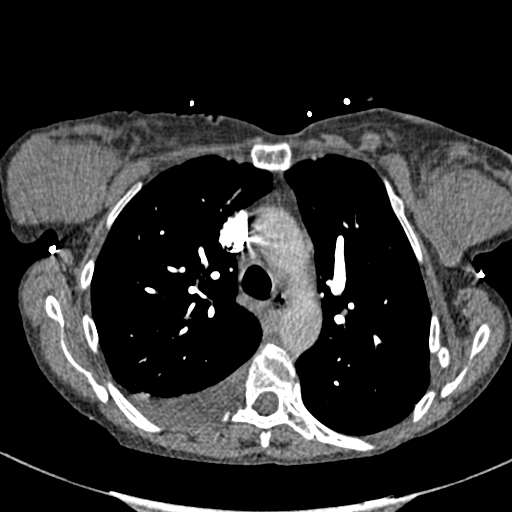
[im 231/295  lung]
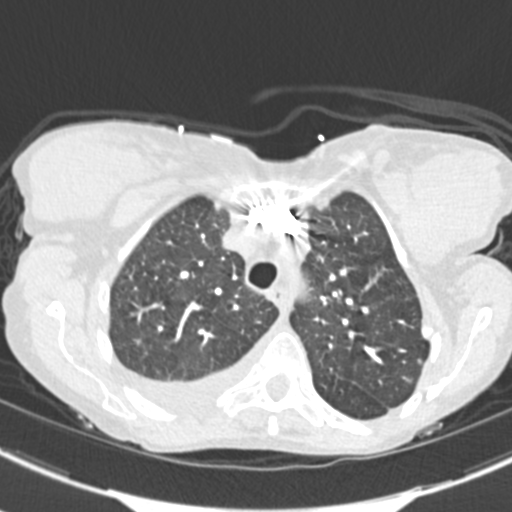
[im 243/295  soft-tissue]
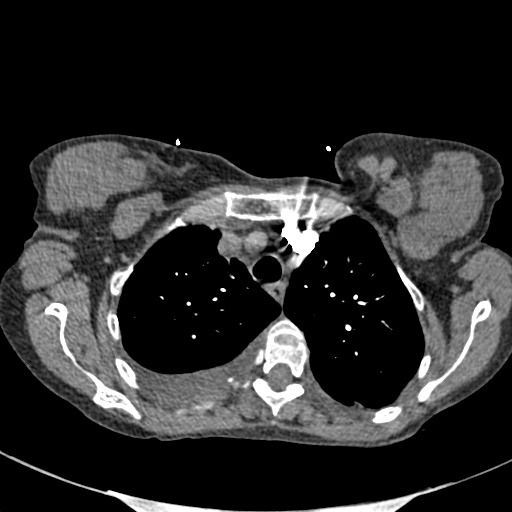
[im 256/295  lung]
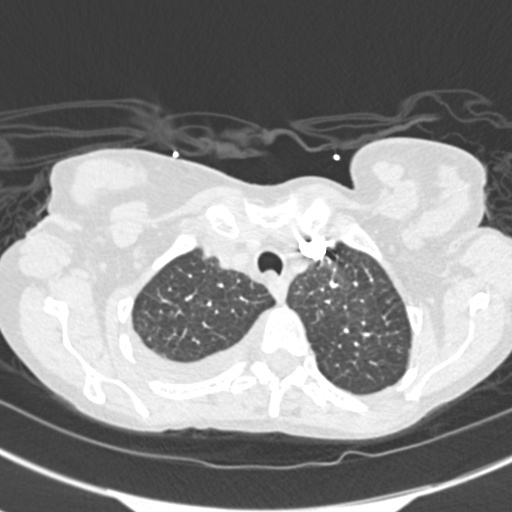
[im 282/295  soft-tissue]
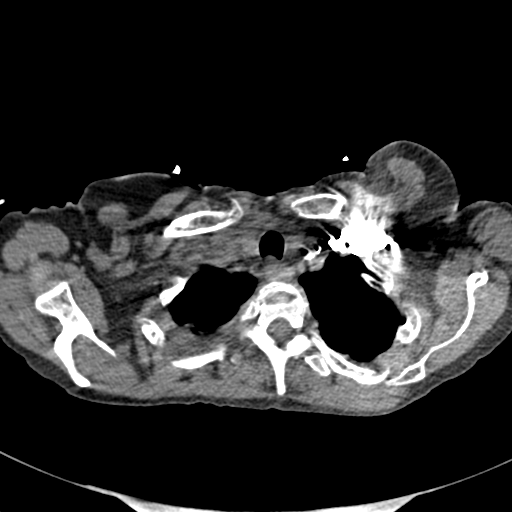

[Series 7: coronal mpr · coronal · 0.60mm/px · 3 of 83 slices shown]
[im 21/83  soft-tissue]
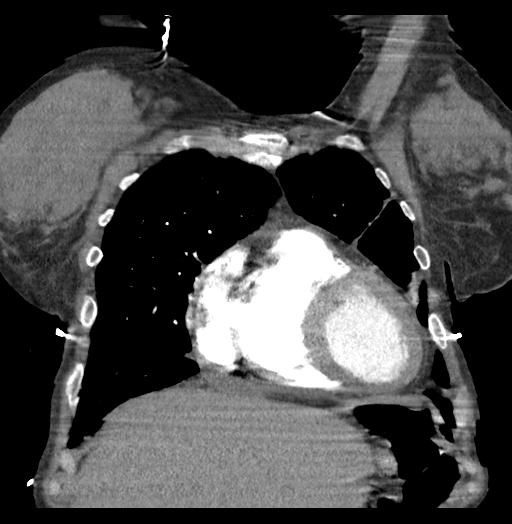
[im 42/83  soft-tissue]
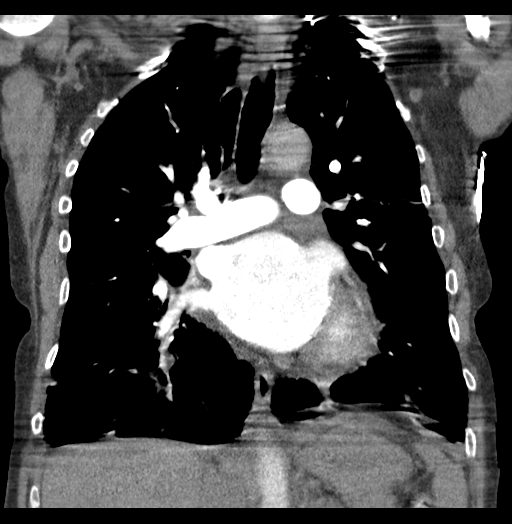
[im 62/83  soft-tissue]
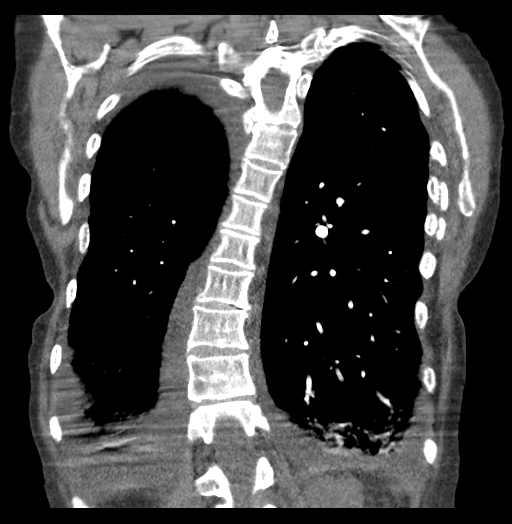

[19 of 46 positions shown; findings below may reference images not displayed]

FINDINGS: Cardiovascular: There is a optimal opacification of the pulmonary
arteries. There is no central,segmental, or subsegmental filling
defects within the pulmonary arteries. There is mild to moderate
cardiomegaly present. No pericardial effusion or thickening. No
evidence right heart strain. There is normal three-vessel
brachiocephalic anatomy without proximal stenosis. Scattered mild
aortic atherosclerosis is seen.

Mediastinum/Nodes: No hilar, mediastinal, or axillary adenopathy.
Thyroid gland, trachea, and esophagus demonstrate no significant
findings.

Lungs/Pleura: Interlobular septal thickening is seen throughout both
lungs. There is small bilateral pleural effusions, right greater
than left. Hazy patchy airspace opacity seen at both lung bases,
right greater than left.

Upper Abdomen: No acute abnormalities present in the visualized
portions of the upper abdomen.

Musculoskeletal: No chest wall abnormality. No acute or significant
osseous findings. Chronic nonunited left-sided rib fractures are
seen.

Review of the MIP images confirms the above findings.
IMPRESSION: No central, segmental, or subsegmental pulmonary embolism.

Small bilateral pleural effusions, right greater than left.

Findings consistent with interstitial edema.

Ground-glass opacities at both lung bases right greater than left
which may be due to asymmetric edema and/or atelectasis.

## 2021-05-19 ENCOUNTER — Other Ambulatory Visit: Payer: Self-pay | Admitting: Family

## 2021-05-21 NOTE — Telephone Encounter (Signed)
Rx(s) sent to pharmacy electronically.  

## 2021-05-27 ENCOUNTER — Other Ambulatory Visit: Payer: Self-pay | Admitting: Internal Medicine

## 2021-05-27 ENCOUNTER — Other Ambulatory Visit: Payer: Self-pay | Admitting: Family

## 2021-05-27 DIAGNOSIS — I10 Essential (primary) hypertension: Secondary | ICD-10-CM

## 2021-05-28 NOTE — Telephone Encounter (Signed)
Rx(s) sent to pharmacy electronically.  

## 2021-06-19 ENCOUNTER — Other Ambulatory Visit: Payer: Self-pay | Admitting: Family

## 2021-06-19 ENCOUNTER — Other Ambulatory Visit: Payer: Self-pay | Admitting: Internal Medicine

## 2021-06-20 ENCOUNTER — Telehealth: Payer: Self-pay

## 2021-06-20 NOTE — Chronic Care Management (AMB) (Signed)
  Care Management   Note  06/20/2021 Name: Dawn Sherman MRN: [id number] DOB: [birth year]  Dawn Sherman is a 65 y.o. year old female who is a primary care patient of [name], MD and is actively engaged with the care management team. I reached out to Dawn Sherman by phone today to assist with re-scheduling a follow up visit with the Pharmacist  Follow up plan: Unsuccessful telephone outreach attempt made. A HIPAA compliant phone message was left for the patient providing contact information and requesting a return call.  The care management team will reach out to the patient again over the next 7 days.  If patient returns call to provider office, please advise to call [facility]  at [place], [place], [place], [place] [address] Direct Dial: [phone] [email or web address] Website: [email or web address]

## 2021-06-26 ENCOUNTER — Ambulatory Visit (INDEPENDENT_AMBULATORY_CARE_PROVIDER_SITE_OTHER): Payer: Medicare Other | Admitting: Internal Medicine

## 2021-06-26 ENCOUNTER — Encounter: Payer: Self-pay | Admitting: Internal Medicine

## 2021-06-26 ENCOUNTER — Other Ambulatory Visit: Payer: Self-pay

## 2021-06-26 VITALS — BP 138/72 | HR 82 | Temp 97.6°F | Resp 16 | Ht 65.0 in | Wt 117.2 lb

## 2021-06-26 DIAGNOSIS — I5042 Chronic combined systolic (congestive) and diastolic (congestive) heart failure: Secondary | ICD-10-CM

## 2021-06-26 DIAGNOSIS — F32A Depression, unspecified: Secondary | ICD-10-CM

## 2021-06-26 DIAGNOSIS — E7800 Pure hypercholesterolemia, unspecified: Secondary | ICD-10-CM

## 2021-06-26 DIAGNOSIS — I480 Paroxysmal atrial fibrillation: Secondary | ICD-10-CM

## 2021-06-26 DIAGNOSIS — I10 Essential (primary) hypertension: Secondary | ICD-10-CM | POA: Diagnosis not present

## 2021-06-26 DIAGNOSIS — R739 Hyperglycemia, unspecified: Secondary | ICD-10-CM

## 2021-06-26 DIAGNOSIS — J432 Centrilobular emphysema: Secondary | ICD-10-CM

## 2021-06-26 DIAGNOSIS — N1832 Chronic kidney disease, stage 3b: Secondary | ICD-10-CM

## 2021-06-26 DIAGNOSIS — Z720 Tobacco use: Secondary | ICD-10-CM

## 2021-06-26 DIAGNOSIS — K219 Gastro-esophageal reflux disease without esophagitis: Secondary | ICD-10-CM

## 2021-06-26 LAB — HEPATIC FUNCTION PANEL
ALT: 7 U/L (ref 0–35)
AST: 10 U/L (ref 0–37)
Albumin: 4.3 g/dL (ref 3.5–5.2)
Alkaline Phosphatase: 105 U/L (ref 39–117)
Bilirubin, Direct: 0 mg/dL (ref 0.0–0.3)
Total Bilirubin: 0.3 mg/dL (ref 0.2–1.2)
Total Protein: 6.8 g/dL (ref 6.0–8.3)

## 2021-06-26 LAB — CBC WITH DIFFERENTIAL/PLATELET
Basophils Absolute: 0.1 10*3/uL (ref 0.0–0.1)
Basophils Relative: 1.1 % (ref 0.0–3.0)
Eosinophils Absolute: 0.1 10*3/uL (ref 0.0–0.7)
Eosinophils Relative: 1.7 % (ref 0.0–5.0)
HCT: 42 % (ref 36.0–46.0)
Hemoglobin: 13.5 g/dL (ref 12.0–15.0)
Lymphocytes Relative: 23.4 % (ref 12.0–46.0)
Lymphs Abs: 2 10*3/uL (ref 0.7–4.0)
MCHC: 32.1 g/dL (ref 30.0–36.0)
MCV: 89 fl (ref 78.0–100.0)
Monocytes Absolute: 0.9 10*3/uL (ref 0.1–1.0)
Monocytes Relative: 10.8 % (ref 3.0–12.0)
Neutro Abs: 5.4 10*3/uL (ref 1.4–7.7)
Neutrophils Relative %: 63 % (ref 43.0–77.0)
Platelets: 206 10*3/uL (ref 150.0–400.0)
RBC: 4.72 Mil/uL (ref 3.87–5.11)
RDW: 17.1 % — ABNORMAL HIGH (ref 11.5–15.5)
WBC: 8.6 10*3/uL (ref 4.0–10.5)

## 2021-06-26 LAB — BASIC METABOLIC PANEL
BUN: 31 mg/dL — ABNORMAL HIGH (ref 6–23)
CO2: 22 mEq/L (ref 19–32)
Calcium: 8.8 mg/dL (ref 8.4–10.5)
Chloride: 108 mEq/L (ref 96–112)
Creatinine, Ser: 1.77 mg/dL — ABNORMAL HIGH (ref 0.40–1.20)
GFR: 29.68 mL/min — ABNORMAL LOW (ref >60.00)
Glucose, Bld: 124 mg/dL — ABNORMAL HIGH (ref 70–99)
Potassium: 4.4 mEq/L (ref 3.5–5.1)
Sodium: 136 mEq/L (ref 135–145)

## 2021-06-26 LAB — HEMOGLOBIN A1C: Hgb A1c MFr Bld: 6.2 % (ref 4.6–6.5)

## 2021-06-26 NOTE — Assessment & Plan Note (Signed)
Breathing stable.  Follow.    

## 2021-06-26 NOTE — Progress Notes (Signed)
Patient ID: Dawn Sherman, female   DOB: Aug 07, 1956, 65 y.o.   MRN: [id number]   Subjective:    Patient ID: Dawn Sherman, female    DOB: [birth year], 65 y.o.   MRN: [id number]  This visit occurred during the SARS-CoV-2 public health emergency.  Safety protocols were in place, including screening questions prior to the visit, additional usage of staff PPE, and extensive cleaning of exam room while observing appropriate contact time as indicated for disinfecting solutions.   Patient here for a scheduled follow up.    HPI Here to follow up regarding CHF, CKD, hypertension and depression.  She reports she is doing well.  Feels good.  Has lost weight.  Reports she is eating.  States has a good appetite.  No nausea or vomiting.  No abdominal pain.  States she has been monitoring her diet and trying to lose weight.  States weight stable now.  Bowels stable.  No chest pain or sob.  Handling stress.    Past Medical History:  Diagnosis Date   B12 deficiency    CHF (congestive heart failure) (HCC)    CKD (chronic kidney disease), stage III (HCC)    Depression    Endometriosis    Hypertension    Mixed hyperlipidemia    Tobacco abuse    Past Surgical History:  Procedure Laterality Date   ABDOMINAL HYSTERECTOMY     BREAST BIOPSY Right 2015   neg-  FIBROADENOMA   TAH/RSO  1999   secondary to bleeding and endometriosis (Dr Vernie Ammons)   Family History  Problem Relation Age of Onset   Hypercholesterolemia Mother    Breast cancer Neg Hx    Colon cancer Neg Hx    Social History   Socioeconomic History   Marital status: Married    Spouse name: Not on file   Number of children: Not on file   Years of education: Not on file   Highest education level: Not on file  Occupational History   Not on file  Tobacco Use   Smoking status: Every Day    Packs/day: 0.25    Years: 40.00    Pack years: 10.00    Types: Cigarettes   Smokeless tobacco: Never  Vaping Use   Vaping Use: Never used   Substance and Sexual Activity   Alcohol use: Not Currently    Alcohol/week: 0.0 standard drinks    Comment: occasional   Drug use: No   Sexual activity: Not Currently  Other Topics Concern   Not on file  Social History Narrative   Lives in [place] with her husband.  She is retired from [employer].  She does not routinely exercise.   Social Determinants of Health   Financial Resource Strain: Medium Risk   Difficulty of Paying Living Expenses: Somewhat hard  Food Insecurity: Not on file  Transportation Needs: Not on file  Physical Activity: Not on file  Stress: No Stress Concern Present   Feeling of Stress : Only a little  Social Connections: Not on file     Review of Systems  Constitutional:  Negative for appetite change.       Weight loss as outlined.    HENT:  Negative for congestion and sinus pressure.   Respiratory:  Negative for cough, chest tightness and shortness of breath.   Cardiovascular:  Negative for chest pain, palpitations and leg swelling.  Gastrointestinal:  Negative for abdominal pain, diarrhea, nausea and vomiting.  Genitourinary:  Negative for difficulty urinating  and dysuria.  Musculoskeletal:  Negative for joint swelling and myalgias.  Skin:  Negative for color change and rash.  Neurological:  Negative for dizziness, light-headedness and headaches.  Psychiatric/Behavioral:  Negative for agitation and dysphoric mood.       Objective:     BP 138/72   Pulse 82   Temp 97.6 F (36.4 C)   Resp 16   Ht $R'5\' 5"'eZ$  (1.651 m)   Wt 117 lb 3.2 oz (53.2 kg)   SpO2 97%   BMI 19.50 kg/m  Wt Readings from Last 3 Encounters:  06/26/21 117 lb 3.2 oz (53.2 kg)  02/23/21 123 lb (55.8 kg)  02/07/21 120 lb 8 oz (54.7 kg)    Physical Exam Vitals reviewed.  Constitutional:      General: She is not in acute distress.    Appearance: Normal appearance.  HENT:     Head: Normocephalic and atraumatic.     Right Ear: External ear normal.     Left Ear:  External ear normal.  Eyes:     General: No scleral icterus.       Right eye: No discharge.        Left eye: No discharge.     Conjunctiva/sclera: Conjunctivae normal.  Neck:     Thyroid: No thyromegaly.  Cardiovascular:     Rate and Rhythm: Normal rate and regular rhythm.  Pulmonary:     Effort: No respiratory distress.     Breath sounds: Normal breath sounds. No wheezing.  Abdominal:     General: Bowel sounds are normal.     Palpations: Abdomen is soft.     Tenderness: There is no abdominal tenderness.  Musculoskeletal:        General: No swelling or tenderness.     Cervical back: Neck supple. No tenderness.  Lymphadenopathy:     Cervical: No cervical adenopathy.  Skin:    Findings: No erythema or rash.  Neurological:     Mental Status: She is alert.  Psychiatric:        Mood and Affect: Mood normal.        Behavior: Behavior normal.     Outpatient Encounter Medications as of 06/26/2021  Medication Sig   carvedilol (COREG) 25 MG tablet TAKE 1 TABLET BY MOUTH TWICE A DAY   acetaminophen (TYLENOL) 325 MG tablet Take 650 mg by mouth every 6 (six) hours as needed.   amLODipine (NORVASC) 10 MG tablet TAKE 1 TABLET BY MOUTH EVERY DAY   atorvastatin (LIPITOR) 40 MG tablet TAKE 1 TABLET BY MOUTH EVERY DAY   buPROPion (WELLBUTRIN XL) 150 MG 24 hr tablet TAKE 1 TABLET BY MOUTH EVERY DAY   ELIQUIS 2.5 MG TABS tablet TAKE 1 TABLET BY MOUTH TWICE A DAY   loratadine (CLARITIN) 10 MG tablet Take 10 mg by mouth daily.   omeprazole (PRILOSEC) 20 MG capsule Take 20 mg by mouth daily.   venlafaxine XR (EFFEXOR-XR) 150 MG 24 hr capsule TAKE 1 CAPSULE BY MOUTH EVERY DAY   No facility-administered encounter medications on file as of 06/26/2021.     Lab Results  Component Value Date   WBC 8.6 06/26/2021   HGB 13.5 06/26/2021   HCT 42.0 06/26/2021   PLT 206.0 06/26/2021   GLUCOSE 124 (H) 06/26/2021   CHOL 180 02/23/2021   TRIG 95.0 02/23/2021   HDL 76.10 02/23/2021   LDLDIRECT 53.3  10/14/2012   LDLCALC 85 02/23/2021   ALT 7 06/26/2021   AST 10 06/26/2021  NA 136 06/26/2021   K 4.4 06/26/2021   CL 108 06/26/2021   CREATININE 1.77 (H) 06/26/2021   BUN 31 (H) 06/26/2021   CO2 22 06/26/2021   TSH 2.90 02/23/2021   INR 1.0 12/13/2013   HGBA1C 6.2 06/26/2021    MM DIAG BREAST TOMO BILATERAL  Result Date: 12/18/2020 CLINICAL DATA:  Short-term follow-up for probably benign right breast calcifications, initially assessed in February 2020. EXAM: DIGITAL DIAGNOSTIC BILATERAL MAMMOGRAM WITH TOMOSYNTHESIS AND CAD TECHNIQUE: Bilateral digital diagnostic mammography and breast tomosynthesis was performed. The images were evaluated with computer-aided detection. COMPARISON:  Previous exam(s). ACR Breast Density Category d: The breast tissue is extremely dense, which lowers the sensitivity of mammography. FINDINGS: Calcifications in the upper right breast are stable. There are no defined masses, areas of architectural distortion, areas of significant asymmetry or new calcifications. IMPRESSION: 1. No evidence of breast malignancy. 2. Benign right breast calcifications stable for over 2 years. RECOMMENDATION: Screening mammogram in one year.(Code:SM-B-01Y) I have discussed the findings and recommendations with the patient. If applicable, a reminder letter will be sent to the patient regarding the next appointment. BI-RADS CATEGORY  2: Benign. Electronically Signed   By: Lajean Manes M.D.   On: 12/18/2020 11:05       Assessment & Plan:   Problem List Items Addressed This Visit     Chronic combined systolic and diastolic CHF (congestive heart failure) ([facility])    Off entresto due to hyperkalemia.  Has done well off lasix.  Continue coreg and eliquis.  No evidence of volume overload.  Follow.       Chronic obstructive pulmonary disease (HCC)    Breathing stable.  Follow.       CKD (chronic kidney disease) stage 3, GFR 30-59 ml/min (HCC)    Continue to avoid antiinflammatories.  Has  been evaluated by nephrology.  Check metabolic panel.       GERD (gastroesophageal reflux disease)    No upper symptoms reported.  On prilosec.       Hypercholesterolemia    Continue lipitor.  Low cholesterol diet and exercise.  Follow lipid panel and liver function tests.        Relevant Orders   CBC with Differential/Platelet (Completed)   Hepatic function panel (Completed)   Hyperglycemia    Low carb diet and exercise.  Follow met b and a1c.       Relevant Orders   Hemoglobin A1c (Completed)   Hypertension - Primary    Off entresto due to hyperkalemia.  Has done well off lasix.  Continue coreg and eliquis.  Follow pressures.  Follow metabolic panel.       Relevant Orders   Basic metabolic panel (Completed)   Mild depression    Doing well on effexor and wellbutrin.       Paroxysmal A-fib (HCC)    Documented paroxysmal afib.  Continue eliquis and coreg.  No increased heart rate or palpitations. Appears to be in SR.  Follow.       Tobacco abuse    Have discussed the need to quit.  Follow.         [name], MD

## 2021-06-26 NOTE — Assessment & Plan Note (Signed)
Low carb diet and exercise.  Follow met b and a1c.  

## 2021-06-26 NOTE — Assessment & Plan Note (Signed)
Doing well on effexor and wellbutrin.

## 2021-06-26 NOTE — Assessment & Plan Note (Signed)
Off entresto due to hyperkalemia.  Has done well off lasix.  Continue coreg and eliquis.  Follow pressures.  Follow metabolic panel.

## 2021-06-26 NOTE — Assessment & Plan Note (Signed)
No upper symptoms reported. On prilosec.  

## 2021-06-26 NOTE — Assessment & Plan Note (Signed)
Continue to avoid antiinflammatories.  Has been evaluated by nephrology.  Check metabolic panel.

## 2021-06-26 NOTE — Assessment & Plan Note (Signed)
Documented paroxysmal afib.  Continue eliquis and coreg.  No increased heart rate or palpitations. Appears to be in SR.  Follow.

## 2021-06-26 NOTE — Assessment & Plan Note (Signed)
Continue lipitor.  Low cholesterol diet and exercise.  Follow lipid panel and liver function tests.   

## 2021-06-26 NOTE — Assessment & Plan Note (Signed)
Have discussed the need to quit.  Follow.  

## 2021-06-26 NOTE — Assessment & Plan Note (Signed)
Off entresto due to hyperkalemia.  Has done well off lasix.  Continue coreg and eliquis.  No evidence of volume overload.  Follow.

## 2021-06-26 NOTE — Chronic Care Management (AMB) (Signed)
  Care Management   Note  06/26/2021 Name: [name] MRN: [id number] DOB: 1956/01/19  Dawn Sherman is a 65 y.o. year old female who is a primary care patient of [name], MD and is actively engaged with the care management team. I reached out to Dawn Sherman by phone today to assist with re-scheduling a follow up visit with the Pharmacist  Follow up plan: Telephone appointment with care management team member scheduled for:08/21/2021  Noreene Larsson, [place], [place], [place] [address] Direct Dial: [phone] [email or web address] Website: [email or web address]

## 2021-06-27 ENCOUNTER — Telehealth: Payer: Self-pay

## 2021-06-27 NOTE — Telephone Encounter (Signed)
-----   Message from [name], MD sent at 06/26/2021 11:43 PM EDT ----- Notify pt that her overall sugar control improved from last check (stable from previous check).  Kidney function has decreased from checks prior. Previously saw nephrology - Dr [name].  Appears to have not seen nephrology recently.  Needs f/u with nephrology given decrease.  Hgb and liver function tests are wnl.  Avoid antiinflammatories.

## 2021-06-27 NOTE — Telephone Encounter (Signed)
Patient called office back.

## 2021-07-09 ENCOUNTER — Telehealth: Payer: Medicare Other

## 2021-08-09 ENCOUNTER — Other Ambulatory Visit: Payer: Self-pay | Admitting: Internal Medicine

## 2021-08-19 ENCOUNTER — Emergency Department: Payer: Medicare Other

## 2021-08-19 ENCOUNTER — Inpatient Hospital Stay
Admission: EM | Admit: 2021-08-19 | Discharge: 2021-08-27 | DRG: 189 | Disposition: A | Discharge status: Home / Self Care | Payer: Medicare Other | Attending: Internal Medicine | Admitting: Internal Medicine

## 2021-08-19 ENCOUNTER — Other Ambulatory Visit: Payer: Self-pay

## 2021-08-19 ENCOUNTER — Encounter: Payer: Self-pay | Admitting: Emergency Medicine

## 2021-08-19 DIAGNOSIS — J441 Chronic obstructive pulmonary disease with (acute) exacerbation: Secondary | ICD-10-CM | POA: Diagnosis present

## 2021-08-19 DIAGNOSIS — F1010 Alcohol abuse, uncomplicated: Secondary | ICD-10-CM | POA: Diagnosis present

## 2021-08-19 DIAGNOSIS — I5031 Acute diastolic (congestive) heart failure: Secondary | ICD-10-CM | POA: Diagnosis not present

## 2021-08-19 DIAGNOSIS — Z882 Allergy status to sulfonamides status: Secondary | ICD-10-CM | POA: Diagnosis not present

## 2021-08-19 DIAGNOSIS — Z20822 Contact with and (suspected) exposure to covid-19: Secondary | ICD-10-CM | POA: Diagnosis present

## 2021-08-19 DIAGNOSIS — I5043 Acute on chronic combined systolic (congestive) and diastolic (congestive) heart failure: Secondary | ICD-10-CM | POA: Diagnosis present

## 2021-08-19 DIAGNOSIS — F32A Depression, unspecified: Secondary | ICD-10-CM | POA: Diagnosis present

## 2021-08-19 DIAGNOSIS — Z83438 Family history of other disorder of lipoprotein metabolism and other lipidemia: Secondary | ICD-10-CM | POA: Diagnosis not present

## 2021-08-19 DIAGNOSIS — N1832 Chronic kidney disease, stage 3b: Secondary | ICD-10-CM | POA: Diagnosis present

## 2021-08-19 DIAGNOSIS — I10 Essential (primary) hypertension: Secondary | ICD-10-CM | POA: Diagnosis present

## 2021-08-19 DIAGNOSIS — I5042 Chronic combined systolic (congestive) and diastolic (congestive) heart failure: Secondary | ICD-10-CM | POA: Diagnosis present

## 2021-08-19 DIAGNOSIS — K219 Gastro-esophageal reflux disease without esophagitis: Secondary | ICD-10-CM | POA: Diagnosis present

## 2021-08-19 DIAGNOSIS — Z720 Tobacco use: Secondary | ICD-10-CM | POA: Diagnosis present

## 2021-08-19 DIAGNOSIS — R0602 Shortness of breath: Secondary | ICD-10-CM | POA: Diagnosis not present

## 2021-08-19 DIAGNOSIS — F17210 Nicotine dependence, cigarettes, uncomplicated: Secondary | ICD-10-CM | POA: Diagnosis present

## 2021-08-19 DIAGNOSIS — E785 Hyperlipidemia, unspecified: Secondary | ICD-10-CM | POA: Diagnosis not present

## 2021-08-19 DIAGNOSIS — I480 Paroxysmal atrial fibrillation: Secondary | ICD-10-CM | POA: Diagnosis present

## 2021-08-19 DIAGNOSIS — E782 Mixed hyperlipidemia: Secondary | ICD-10-CM | POA: Diagnosis present

## 2021-08-19 DIAGNOSIS — Z885 Allergy status to narcotic agent status: Secondary | ICD-10-CM | POA: Diagnosis not present

## 2021-08-19 DIAGNOSIS — Z90710 Acquired absence of both cervix and uterus: Secondary | ICD-10-CM | POA: Diagnosis not present

## 2021-08-19 DIAGNOSIS — Z7901 Long term (current) use of anticoagulants: Secondary | ICD-10-CM | POA: Diagnosis not present

## 2021-08-19 DIAGNOSIS — Z880 Allergy status to penicillin: Secondary | ICD-10-CM

## 2021-08-19 DIAGNOSIS — J9601 Acute respiratory failure with hypoxia: Principal | ICD-10-CM | POA: Diagnosis present

## 2021-08-19 DIAGNOSIS — Z881 Allergy status to other antibiotic agents status: Secondary | ICD-10-CM

## 2021-08-19 DIAGNOSIS — R7303 Prediabetes: Secondary | ICD-10-CM | POA: Diagnosis present

## 2021-08-19 DIAGNOSIS — E440 Moderate protein-calorie malnutrition: Secondary | ICD-10-CM | POA: Insufficient documentation

## 2021-08-19 DIAGNOSIS — N183 Chronic kidney disease, stage 3 unspecified: Secondary | ICD-10-CM | POA: Diagnosis present

## 2021-08-19 DIAGNOSIS — I130 Hypertensive heart and chronic kidney disease with heart failure and stage 1 through stage 4 chronic kidney disease, or unspecified chronic kidney disease: Secondary | ICD-10-CM | POA: Diagnosis present

## 2021-08-19 DIAGNOSIS — Z79899 Other long term (current) drug therapy: Secondary | ICD-10-CM

## 2021-08-19 DIAGNOSIS — A419 Sepsis, unspecified organism: Secondary | ICD-10-CM | POA: Diagnosis present

## 2021-08-19 LAB — BLOOD GAS, VENOUS
Acid-base deficit: 1.5 mmol/L (ref 0.0–2.0)
Bicarbonate: 23.7 mmol/L (ref 20.0–28.0)
O2 Saturation: 93 %
Patient temperature: 37
pCO2, Ven: 41 mmHg — ABNORMAL LOW (ref 44.0–60.0)
pH, Ven: 7.37 (ref 7.250–7.430)
pO2, Ven: 69 mmHg — ABNORMAL HIGH (ref 32.0–45.0)

## 2021-08-19 LAB — CBC WITH DIFFERENTIAL/PLATELET
Abs Immature Granulocytes: 0.04 10*3/uL (ref 0.00–0.07)
Basophils Absolute: 0.1 10*3/uL (ref 0.0–0.1)
Basophils Relative: 1 %
Eosinophils Absolute: 0.5 10*3/uL (ref 0.0–0.5)
Eosinophils Relative: 4 %
HCT: 40.8 % (ref 36.0–46.0)
Hemoglobin: 13.4 g/dL (ref 12.0–15.0)
Immature Granulocytes: 0 %
Lymphocytes Relative: 13 %
Lymphs Abs: 1.7 10*3/uL (ref 0.7–4.0)
MCH: 29.3 pg (ref 26.0–34.0)
MCHC: 32.8 g/dL (ref 30.0–36.0)
MCV: 89.1 fL (ref 80.0–100.0)
Monocytes Absolute: 1.5 10*3/uL — ABNORMAL HIGH (ref 0.1–1.0)
Monocytes Relative: 11 %
Neutro Abs: 9.3 10*3/uL — ABNORMAL HIGH (ref 1.7–7.7)
Neutrophils Relative %: 71 %
Platelets: 210 10*3/uL (ref 150–400)
RBC: 4.58 MIL/uL (ref 3.87–5.11)
RDW: 15.4 % (ref 11.5–15.5)
WBC: 13.1 10*3/uL — ABNORMAL HIGH (ref 4.0–10.5)
nRBC: 0 % (ref 0.0–0.2)

## 2021-08-19 LAB — COMPREHENSIVE METABOLIC PANEL
ALT: 13 U/L (ref 0–44)
AST: 16 U/L (ref 15–41)
Albumin: 4.1 g/dL (ref 3.5–5.0)
Alkaline Phosphatase: 74 U/L (ref 38–126)
Anion gap: 8 (ref 5–15)
BUN: 23 mg/dL (ref 8–23)
CO2: 25 mmol/L (ref 22–32)
Calcium: 9 mg/dL (ref 8.9–10.3)
Chloride: 105 mmol/L (ref 98–111)
Creatinine, Ser: 1.63 mg/dL — ABNORMAL HIGH (ref 0.44–1.00)
GFR, Estimated: 35 mL/min — ABNORMAL LOW (ref >60)
Glucose, Bld: 86 mg/dL (ref 70–99)
Potassium: 4.4 mmol/L (ref 3.5–5.1)
Sodium: 138 mmol/L (ref 135–145)
Total Bilirubin: 0.6 mg/dL (ref 0.3–1.2)
Total Protein: 7.6 g/dL (ref 6.5–8.1)

## 2021-08-19 LAB — TROPONIN I (HIGH SENSITIVITY)
Troponin I (High Sensitivity): 7 ng/L
Troponin I (High Sensitivity): 7 ng/L (ref <18)

## 2021-08-19 LAB — RESP PANEL BY RT-PCR (FLU A&B, COVID) ARPGX2
Influenza A by PCR: NEGATIVE
Influenza B by PCR: NEGATIVE
SARS Coronavirus 2 by RT PCR: NEGATIVE

## 2021-08-19 LAB — BRAIN NATRIURETIC PEPTIDE: B Natriuretic Peptide: 378.1 pg/mL — ABNORMAL HIGH (ref 0.0–100.0)

## 2021-08-19 MED ORDER — PANTOPRAZOLE SODIUM 40 MG IV SOLR
40.0000 mg | Freq: Two times a day (BID) | INTRAVENOUS | Status: DC
  Administered 2021-08-19 – 2021-08-20 (×2): 40 mg via INTRAVENOUS
  Filled 2021-08-19 (×2): qty 40

## 2021-08-19 MED ORDER — ACETAMINOPHEN 650 MG RE SUPP: 650.0000 mg | Freq: Four times a day (QID) | RECTAL | Status: DC | PRN

## 2021-08-19 MED ORDER — PREDNISONE 20 MG PO TABS
60.0000 mg | ORAL_TABLET | Freq: Once | ORAL | Status: AC
  Administered 2021-08-19: 19:00:00 60 mg via ORAL
  Filled 2021-08-19: qty 3

## 2021-08-19 MED ORDER — IPRATROPIUM-ALBUTEROL 0.5-2.5 (3) MG/3ML IN SOLN
9.0000 mL | Freq: Once | RESPIRATORY_TRACT | Status: AC
  Administered 2021-08-19: 19:00:00 9 mL via RESPIRATORY_TRACT
  Filled 2021-08-19: qty 9

## 2021-08-19 MED ORDER — ONDANSETRON HCL 4 MG PO TABS: 4.0000 mg | ORAL_TABLET | Freq: Four times a day (QID) | ORAL | Status: DC | PRN

## 2021-08-19 MED ORDER — CARVEDILOL 25 MG PO TABS
25.0000 mg | ORAL_TABLET | Freq: Two times a day (BID) | ORAL | Status: DC
  Administered 2021-08-20 – 2021-08-27 (×15): 25 mg via ORAL
  Filled 2021-08-19 (×15): qty 1

## 2021-08-19 MED ORDER — BUPROPION HCL ER (XL) 150 MG PO TB24
150.0000 mg | ORAL_TABLET | Freq: Every day | ORAL | Status: DC
  Administered 2021-08-20 – 2021-08-27 (×8): 150 mg via ORAL
  Filled 2021-08-19 (×8): qty 1

## 2021-08-19 MED ORDER — PREDNISONE 20 MG PO TABS: 40.0000 mg | ORAL_TABLET | Freq: Every day | ORAL | Status: DC

## 2021-08-19 MED ORDER — ALBUTEROL SULFATE (2.5 MG/3ML) 0.083% IN NEBU: 2.5000 mg | INHALATION_SOLUTION | RESPIRATORY_TRACT | Status: DC | PRN

## 2021-08-19 MED ORDER — NICOTINE 14 MG/24HR TD PT24
14.0000 mg | MEDICATED_PATCH | Freq: Every day | TRANSDERMAL | Status: DC
  Administered 2021-08-21 – 2021-08-27 (×7): 14 mg via TRANSDERMAL
  Filled 2021-08-19 (×8): qty 1

## 2021-08-19 MED ORDER — IPRATROPIUM-ALBUTEROL 0.5-2.5 (3) MG/3ML IN SOLN
3.0000 mL | Freq: Four times a day (QID) | RESPIRATORY_TRACT | Status: DC
  Administered 2021-08-20 – 2021-08-23 (×15): 3 mL via RESPIRATORY_TRACT
  Filled 2021-08-19 (×15): qty 3

## 2021-08-19 MED ORDER — AMLODIPINE BESYLATE 10 MG PO TABS
10.0000 mg | ORAL_TABLET | Freq: Every day | ORAL | Status: DC
  Administered 2021-08-20 – 2021-08-27 (×7): 10 mg via ORAL
  Filled 2021-08-19 (×4): qty 1
  Filled 2021-08-19: qty 2
  Filled 2021-08-19 (×3): qty 1

## 2021-08-19 MED ORDER — ATORVASTATIN CALCIUM 20 MG PO TABS
40.0000 mg | ORAL_TABLET | Freq: Every day | ORAL | Status: DC
  Administered 2021-08-20 – 2021-08-27 (×8): 40 mg via ORAL
  Filled 2021-08-19 (×8): qty 2

## 2021-08-19 MED ORDER — ACETAMINOPHEN 325 MG PO TABS
650.0000 mg | ORAL_TABLET | Freq: Four times a day (QID) | ORAL | Status: DC | PRN
  Administered 2021-08-20 – 2021-08-27 (×5): 650 mg via ORAL
  Filled 2021-08-19 (×5): qty 2

## 2021-08-19 MED ORDER — METHYLPREDNISOLONE SODIUM SUCC 125 MG IJ SOLR
60.0000 mg | Freq: Two times a day (BID) | INTRAMUSCULAR | Status: DC
  Administered 2021-08-19: 20:00:00 60 mg via INTRAVENOUS
  Filled 2021-08-19 (×2): qty 2

## 2021-08-19 MED ORDER — INSULIN ASPART 100 UNIT/ML IJ SOLN
0.0000 [IU] | Freq: Three times a day (TID) | INTRAMUSCULAR | Status: DC
  Administered 2021-08-20 (×2): 2 [IU] via SUBCUTANEOUS
  Administered 2021-08-21: 18:00:00 3 [IU] via SUBCUTANEOUS
  Administered 2021-08-23: 17:00:00 2 [IU] via SUBCUTANEOUS
  Administered 2021-08-24 (×2): 3 [IU] via SUBCUTANEOUS
  Administered 2021-08-24: 17:00:00 1 [IU] via SUBCUTANEOUS
  Administered 2021-08-25 – 2021-08-26 (×3): 2 [IU] via SUBCUTANEOUS
  Filled 2021-08-19 (×8): qty 1

## 2021-08-19 MED ORDER — ONDANSETRON HCL 4 MG/2ML IJ SOLN: 4.0000 mg | Freq: Four times a day (QID) | INTRAMUSCULAR | Status: DC | PRN

## 2021-08-19 MED ORDER — SODIUM CHLORIDE 0.9 % IV SOLN: 250.0000 mL | INTRAVENOUS | Status: DC | PRN

## 2021-08-19 MED ORDER — SODIUM CHLORIDE 0.9% FLUSH: 3.0000 mL | INTRAVENOUS | Status: DC | PRN

## 2021-08-19 MED ORDER — SODIUM CHLORIDE 0.9% FLUSH
3.0000 mL | Freq: Two times a day (BID) | INTRAVENOUS | Status: DC
  Administered 2021-08-20 – 2021-08-26 (×11): 3 mL via INTRAVENOUS

## 2021-08-19 MED ORDER — SODIUM CHLORIDE 0.9% FLUSH
3.0000 mL | Freq: Two times a day (BID) | INTRAVENOUS | Status: DC
  Administered 2021-08-19 – 2021-08-26 (×14): 3 mL via INTRAVENOUS

## 2021-08-19 MED ORDER — VENLAFAXINE HCL ER 75 MG PO CP24
150.0000 mg | ORAL_CAPSULE | Freq: Every day | ORAL | Status: DC
  Administered 2021-08-20 – 2021-08-27 (×8): 150 mg via ORAL
  Filled 2021-08-19: qty 1
  Filled 2021-08-19 (×7): qty 2

## 2021-08-19 MED ORDER — FUROSEMIDE 10 MG/ML IJ SOLN
20.0000 mg | Freq: Once | INTRAMUSCULAR | Status: AC
  Administered 2021-08-19: 23:00:00 20 mg via INTRAVENOUS
  Filled 2021-08-19: qty 4

## 2021-08-19 MED ORDER — AZITHROMYCIN 500 MG PO TABS
500.0000 mg | ORAL_TABLET | Freq: Every day | ORAL | Status: AC
  Administered 2021-08-20 – 2021-08-23 (×4): 500 mg via ORAL
  Filled 2021-08-19 (×4): qty 1

## 2021-08-19 MED ORDER — HYDRALAZINE HCL 20 MG/ML IJ SOLN: 5.0000 mg | INTRAMUSCULAR | Status: DC | PRN

## 2021-08-19 MED ORDER — SODIUM CHLORIDE 0.9 % IV SOLN
500.0000 mg | INTRAVENOUS | Status: AC
  Administered 2021-08-19: 20:00:00 500 mg via INTRAVENOUS
  Filled 2021-08-19: qty 5

## 2021-08-19 MED ORDER — GUAIFENESIN ER 600 MG PO TB12
600.0000 mg | ORAL_TABLET | Freq: Two times a day (BID) | ORAL | Status: DC | PRN
  Administered 2021-08-21 – 2021-08-24 (×2): 600 mg via ORAL
  Filled 2021-08-19 (×2): qty 1

## 2021-08-19 MED ORDER — APIXABAN 2.5 MG PO TABS
2.5000 mg | ORAL_TABLET | Freq: Two times a day (BID) | ORAL | Status: DC
  Administered 2021-08-20 – 2021-08-27 (×15): 2.5 mg via ORAL
  Filled 2021-08-19 (×16): qty 1

## 2021-08-19 NOTE — ED Provider Notes (Signed)
[facility] Emergency Department Provider Note   ____________________________________________   Event Date/Time   First MD Initiated Contact with Patient 08/19/21 1805     (approximate)  I have reviewed the triage vital signs and the nursing notes.   HISTORY  Chief Complaint Shortness of Breath and Hypertension    HPI Dawn Sherman is a 65 y.o. female with past medical history of hypertension, hyperlipidemia, CHF, atrial fibrillation on Eliquis, COPD, CKD, and alcohol abuse who presents to the ED complaining of shortness of breath.  Patient reports that she has been having increasing difficulty breathing over the past 24 to 48 hours with a productive cough.  She denies any fevers or pain in her chest, does report increased swelling in her legs bilaterally over the past 2 days.  She describes symptoms as similar to prior CHF exacerbations, is not aware of any diagnosis of COPD and does not have an inhaler at home that she uses.  She was noted to have O2 sats of 77% on room air in triage, does not wear oxygen at home.        Past Medical History:  Diagnosis Date   B12 deficiency    CHF (congestive heart failure) ([place])    CKD (chronic kidney disease), stage III ([place])    Depression    Endometriosis    Hypertension    Mixed hyperlipidemia    Tobacco abuse     Patient Active Problem List   Diagnosis Date Noted   Hyperglycemia 02/23/2021   Chronic obstructive pulmonary disease ([place]) 11/22/2020   CKD (chronic kidney disease) stage 3, GFR 30-59 ml/min ([place]) 08/12/2020   Paroxysmal A-fib ([place]) 08/12/2020   Nausea and vomiting 06/17/2020   Dehydration    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HCC)    Nausea vomiting and diarrhea 06/16/2020   Chronic combined systolic and diastolic CHF (congestive heart failure) ([place]) 02/10/2020   Leukocytosis 02/10/2020   HLD (hyperlipidemia) 02/10/2020   Elevated troponin 02/10/2020   GERD (gastroesophageal reflux disease)  02/10/2020   Abnormal mammogram 07/13/2019   Sleep concern 10/17/2018   History of alcohol abuse 07/31/2018   B12 deficiency 07/31/2018   Acute renal failure superimposed on stage 3b chronic kidney disease ([place]) 11/03/2016   Health care maintenance 12/02/2014   Tobacco abuse 12/02/2014   Foot pain, left 11/29/2014   Hypercholesterolemia 11/29/2014   Encounter for screening colonoscopy 12/16/2013   Breast mass 12/15/2012   Hypertension 07/05/2012   Mild depression 07/05/2012    Past Surgical History:  Procedure Laterality Date   ABDOMINAL HYSTERECTOMY     BREAST BIOPSY Right 2015   neg-  FIBROADENOMA   TAH/RSO  1999   secondary to bleeding and endometriosis (Dr Vernie Ammons)    Prior to Admission medications   Medication Sig Start Date End Date Taking? Authorizing Provider  carvedilol (COREG) 25 MG tablet TAKE 1 TABLET BY MOUTH TWICE A DAY 06/19/21   Minna Merritts, MD  acetaminophen (TYLENOL) 325 MG tablet Take 650 mg by mouth every 6 (six) hours as needed.    [provider]  amLODipine (NORVASC) 10 MG tablet TAKE 1 TABLET BY MOUTH EVERY DAY 05/28/21   Minna Merritts, MD  atorvastatin (LIPITOR) 40 MG tablet TAKE 1 TABLET BY MOUTH EVERY DAY 08/10/21   [name], MD  buPROPion (WELLBUTRIN XL) 150 MG 24 hr tablet TAKE 1 TABLET BY MOUTH EVERY DAY 06/19/21   [name], MD  ELIQUIS 2.5 MG TABS tablet TAKE 1 TABLET BY MOUTH  TWICE A DAY 05/21/21   Minna Merritts, MD  loratadine (CLARITIN) 10 MG tablet Take 10 mg by mouth daily.    [provider]  omeprazole (PRILOSEC) 20 MG capsule Take 20 mg by mouth daily.    [provider]  venlafaxine XR (EFFEXOR-XR) 150 MG 24 hr capsule TAKE 1 CAPSULE BY MOUTH EVERY DAY 05/28/21   [name], MD    Allergies Amoxicillin, Benicar [olmesartan], Codeine sulfate, Morphine and related, Penicillins, Sulfate, and Clindamycin/lincomycin  Family History  Problem Relation Age of Onset    Hypercholesterolemia Mother    Breast cancer Neg Hx    Colon cancer Neg Hx     Social History Social History   Tobacco Use   Smoking status: Every Day    Packs/day: 0.25    Years: 40.00    Pack years: 10.00    Types: Cigarettes   Smokeless tobacco: Never  Vaping Use   Vaping Use: Never used  Substance Use Topics   Alcohol use: Not Currently    Alcohol/week: 0.0 standard drinks    Comment: occasional   Drug use: No    Review of Systems  Constitutional: No fever/chills Eyes: No visual changes. ENT: No sore throat. Cardiovascular: Denies chest pain. Respiratory: Positive for cough and shortness of breath. Gastrointestinal: No abdominal pain.  No nausea, no vomiting.  No diarrhea.  No constipation. Genitourinary: Negative for dysuria. Musculoskeletal: Negative for back pain.  Positive for leg swelling. Skin: Negative for rash. Neurological: Negative for headaches, focal weakness or numbness.  ____________________________________________   PHYSICAL EXAM:  VITAL SIGNS: ED Triage Vitals  Enc Vitals Group     BP 08/19/21 1751 (!) 162/76     Pulse Rate 08/19/21 1751 (!) 102     Resp 08/19/21 1751 (!) 24     Temp 08/19/21 1751 (!) 97.5 F (36.4 C)     Temp Source 08/19/21 1751 Oral     SpO2 08/19/21 1751 (!) 77 %     Weight 08/19/21 1752 121 lb (54.9 kg)     Height 08/19/21 1752 5\' 5"  (1.651 m)     Head Circumference --      Peak Flow --      Pain Score 08/19/21 1752 0     Pain Loc --      Pain Edu? --      Excl. in [place]? --     Constitutional: Alert and oriented. Eyes: Conjunctivae are normal. Head: Atraumatic. Nose: No congestion/rhinnorhea. Mouth/Throat: Mucous membranes are moist. Neck: Normal ROM Cardiovascular: Normal rate, regular rhythm. Grossly normal heart sounds.  2+ radial pulses bilaterally. Respiratory: Tachypneic with increased respiratory effort.  No retractions. Lungs with faint wheezing bilaterally. Gastrointestinal: Soft and nontender. No  distention. Genitourinary: deferred Musculoskeletal: No lower extremity tenderness, 1+ pitting edema to mid shins bilaterally. Neurologic:  Normal speech and language. No gross focal neurologic deficits are appreciated. Skin:  Skin is warm, dry and intact. No rash noted. Psychiatric: Mood and affect are normal. Speech and behavior are normal.  ____________________________________________   LABS (all labs ordered are listed, but only abnormal results are displayed)  Labs Reviewed  CBC WITH DIFFERENTIAL/PLATELET - Abnormal; Notable for the following components:      Result Value   WBC 13.1 (*)    Neutro Abs 9.3 (*)    Monocytes Absolute 1.5 (*)    All other components within normal limits  COMPREHENSIVE METABOLIC PANEL - Abnormal; Notable for the following components:   Creatinine, Ser 1.63 (*)  GFR, Estimated 35 (*)    All other components within normal limits  BRAIN NATRIURETIC PEPTIDE - Abnormal; Notable for the following components:   B Natriuretic Peptide 378.1 (*)    All other components within normal limits  RESP PANEL BY RT-PCR (FLU A&B, COVID) [id number]  TROPONIN I (HIGH SENSITIVITY)   ____________________________________________  EKG  ED ECG REPORT I, Blake Divine, the attending physician, personally viewed and interpreted this ECG.   Date: 08/19/2021  EKG Time: 17:39  Rate: 103  Rhythm: sinus tachycardia  Axis: Normal  Intervals:none  ST&T Change: Inferolateral ST/T changes   PROCEDURES  Procedure(s) performed (including Critical Care):  .Critical Care Performed by: Blake Divine, MD Authorized by: Blake Divine, MD   Critical care provider statement:    Critical care time (minutes):  30   Critical care time was exclusive of:  Separately billable procedures and treating other patients and teaching time   Critical care was necessary to treat or prevent imminent or life-threatening deterioration of the following conditions:  Respiratory failure    Critical care was time spent personally by me on the following activities:  Development of treatment plan with patient or surrogate, discussions with consultants, evaluation of patient's response to treatment, examination of patient, ordering and review of laboratory studies, ordering and review of radiographic studies, ordering and performing treatments and interventions, pulse oximetry, re-evaluation of patient's condition and review of old charts   I assumed direction of critical care for this patient from another provider in my specialty: no     Care discussed with: admitting provider     ____________________________________________   INITIAL IMPRESSION / ASSESSMENT AND PLAN / ED COURSE      65 year old female with past medical history of hypertension, hyperlipidemia, CHF, atrial fibrillation on Eliquis, COPD, CKD, and alcohol abuse who presents to the ED with 24 to 48 hours of increasing difficulty breathing along with productive cough.  Patient noted to be hypoxic to 77% in triage, now improved on 2 L nasal cannula with tachypnea and increased respiratory effort.  Patient clinically appears fluid overloaded and I suspect CHF as the cause of her symptoms.  EKG shows inferolateral ST/T wave changes, troponin is pending but patient denies any chest pain.  Chest x-ray reviewed by me and shows no infiltrate, edema, or effusion.  Labs are unremarkable with only mild elevation in BNP, troponin within normal limits.  Given these findings, I would favor bronchospasm versus CHF exacerbation.  Patient given duo nebs and steroids, now moving more air with more audible wheezing, states her breathing feels like it is improving.  She continues to require 2 L nasal cannula of supplemental oxygen, plan to discuss with hospitalist for admission.      ____________________________________________   FINAL CLINICAL IMPRESSION(S) / ED DIAGNOSES  Final diagnoses:  COPD exacerbation ([place])  Acute respiratory  failure with hypoxia [facility])     ED Discharge Orders     None        Note:  This document was prepared using Dragon voice recognition software and may include unintentional dictation errors.    Blake Divine, MD 08/19/21 [phone]

## 2021-08-19 NOTE — ED Notes (Signed)
Per pt, she has taken all home medications today prior to coming to the ER.

## 2021-08-19 NOTE — ED Triage Notes (Signed)
Pt reports that she developed SHOB and HTN yesterday. Pt does have a cough. She is speaking in 1 to 2 word sentences

## 2021-08-19 NOTE — ED Notes (Signed)
VBG sent to lab & RT called & is aware.

## 2021-08-19 NOTE — ED Notes (Signed)
Covid swab sent to lab.

## 2021-08-19 NOTE — ED Notes (Signed)
Report received from Michelle, RN

## 2021-08-19 NOTE — Progress Notes (Signed)
Late entry: Called by RN to assess patient secondary to desaturation. No increase wob noted at bedside, patient is able to complete sentences. Placed on cool high flow. MD has been made aware.

## 2021-08-19 NOTE — ED Notes (Signed)
Msg sent to Dr. Posey Pronto re: pt's O2 sats 85-87% on the 6L/min [place]. RT called & will come evaluate pt.

## 2021-08-19 NOTE — H&P (Addendum)
History and Physical    Dawn Sherman TGY:[id number] DOB: 1955/10/28 DOA: 08/19/2021  PCP: [name], MD    Patient coming from:  Home    Chief Complaint:  Sob    HPI:  Dawn Sherman is a 65 y.o. female seen in ed with complaints of SOB. Sob x started since Friday 08/17/2021. 10/10, intermittent,no alleviating or aggravating factors.  Just doing normal routine activities. Ankles have been swelling since Friday and that is new, Sob without orthopnea. Patient denies any headaches blurred vision speech or gait issues fevers chills chest pain chest pressure palpitations abdominal pain nausea vomiting diarrhea urinary issues skin rashes.  Patient continues to smoke and currently smokes less than half a pack a day.  Pt has past medical history of congestive heart failure, chronic kidney disease, tobacco abuse, B12 deficiency, hypertension.  ED Course:  Vitals:   08/19/21 2106 08/19/21 2109 08/19/21 2125 08/19/21 2130  BP:    132/67  Pulse: (!) 103 (!) 103  99  Resp: 20 (!) 28 (!) 28 18  Temp:      TempSrc:      SpO2: (!) 87% (!) 85% 93% 92%  Weight:      Height:      In ed pt is alert,awake and on 2 L [place] and is hypoxic so o2 increased to 6 L [place] sepsis criteria but source is not clear. Pt also meeting sepsis criteria but no clear source.  Labs show: Creatinine of 1.63, Lab Results  Component Value Date   CREATININE 1.63 (H) 08/19/2021   CREATININE 1.77 (H) 06/26/2021   CREATININE 1.49 (H) 02/23/2021  BNP of 378.1.  Troponin of 7 CBC shows a white count of 13.1 hemoglobin of 13.4 platelet counts of 210. Intermittently patient has had A1c's ranging from 5.7-6.2 nothing above, thereby meeting prediabetes.  Review of Systems:  Review of Systems  Respiratory:  Positive for cough, shortness of breath and wheezing.   Cardiovascular:  Positive for leg swelling.  All other systems reviewed and are negative.   Past Medical History:  Diagnosis Date   B12  deficiency    CHF (congestive heart failure) ([place])    CKD (chronic kidney disease), stage III ([place])    Depression    Endometriosis    Hypertension    Mixed hyperlipidemia    Tobacco abuse     Past Surgical History:  Procedure Laterality Date   ABDOMINAL HYSTERECTOMY     BREAST BIOPSY Right 2015   neg-  FIBROADENOMA   TAH/RSO  1999   secondary to bleeding and endometriosis (Dr Vernie Ammons)     reports that she has been smoking cigarettes. She has a 10.00 pack-year smoking history. She has never used smokeless tobacco. She reports that she does not currently use alcohol. She reports that she does not use drugs.  Allergies  Allergen Reactions   Amoxicillin    Benicar [Olmesartan]     Talked with patient February 10, 2020, intolerance is unclear, tried several medications around that time and one of them gave her a rash but she is not clear which 1.   Codeine Sulfate    Morphine And Related    Penicillins    Sulfate    Clindamycin/Lincomycin Rash    Family History  Problem Relation Age of Onset   Hypercholesterolemia Mother    Rheum arthritis Father    Breast cancer Neg Hx    Colon cancer Neg Hx     Prior to Admission medications  Medication Sig Start Date End Date Taking? Authorizing Provider  carvedilol (COREG) 25 MG tablet TAKE 1 TABLET BY MOUTH TWICE A DAY 06/19/21   Minna Merritts, MD  acetaminophen (TYLENOL) 325 MG tablet Take 650 mg by mouth every 6 (six) hours as needed.    [provider]  amLODipine (NORVASC) 10 MG tablet TAKE 1 TABLET BY MOUTH EVERY DAY 05/28/21   Minna Merritts, MD  atorvastatin (LIPITOR) 40 MG tablet TAKE 1 TABLET BY MOUTH EVERY DAY 08/10/21   [name], MD  buPROPion (WELLBUTRIN XL) 150 MG 24 hr tablet TAKE 1 TABLET BY MOUTH EVERY DAY 06/19/21   [name], MD  ELIQUIS 2.5 MG TABS tablet TAKE 1 TABLET BY MOUTH TWICE A DAY 05/21/21   Minna Merritts, MD  loratadine (CLARITIN) 10 MG tablet Take 10 mg by mouth daily.     [provider]  omeprazole (PRILOSEC) 20 MG capsule Take 20 mg by mouth daily.    [provider]  venlafaxine XR (EFFEXOR-XR) 150 MG 24 hr capsule TAKE 1 CAPSULE BY MOUTH EVERY DAY 05/28/21   [name], MD    Physical Exam: Vitals:   08/19/21 2106 08/19/21 2109 08/19/21 2125 08/19/21 2130  BP:    132/67  Pulse: (!) 103 (!) 103  99  Resp: 20 (!) 28 (!) 28 18  Temp:      TempSrc:      SpO2: (!) 87% (!) 85% 93% 92%  Weight:      Height:       Physical Exam Vitals reviewed.  Constitutional:      General: She is not in acute distress.    Appearance: She is not ill-appearing, toxic-appearing or diaphoretic.  HENT:     Head: Normocephalic and atraumatic.     Right Ear: External ear normal.     Left Ear: External ear normal.     Nose: Nose normal.     Mouth/Throat:     Mouth: Mucous membranes are moist.  Eyes:     Extraocular Movements: Extraocular movements intact.     Pupils: Pupils are equal, round, and reactive to light.  Neck:     Vascular: No carotid bruit.  Cardiovascular:     Rate and Rhythm: Normal rate and regular rhythm.     Pulses: Normal pulses.     Heart sounds: Normal heart sounds.  Pulmonary:     Effort: Pulmonary effort is normal.     Breath sounds: Wheezing present.  Abdominal:     General: Bowel sounds are normal.     Palpations: Abdomen is soft.  Musculoskeletal:     Right lower leg: Edema present.     Left lower leg: Edema present.  Neurological:     General: No focal deficit present.     Mental Status: She is alert and oriented to person, place, and time.  Psychiatric:        Mood and Affect: Mood normal.        Behavior: Behavior normal.    Labs on Admission: I have personally reviewed following labs and imaging studies  No results for input(s): CKTOTAL, CKMB, TROPONINI in the last 72 hours. Lab Results  Component Value Date   WBC 13.1 (H) 08/19/2021   HGB 13.4 08/19/2021   HCT 40.8 08/19/2021   MCV 89.1 08/19/2021    PLT 210 08/19/2021    Recent Labs  Lab 08/19/21 1803  NA 138  K 4.4  CL 105  CO2 25  BUN 23  CREATININE 1.63*  CALCIUM 9.0  PROT 7.6  BILITOT 0.6  ALKPHOS 74  ALT 13  AST 16  GLUCOSE 86   Lab Results  Component Value Date   CHOL 180 02/23/2021   HDL 76.10 02/23/2021   LDLCALC 85 02/23/2021   TRIG 95.0 02/23/2021   No results found for: DDIMER Invalid input(s): POCBNP   COVID-19 Labs No results for input(s): DDIMER, FERRITIN, LDH, CRP in the last 72 hours. Lab Results  Component Value Date   SARSCOV2NAA NEGATIVE 08/19/2021   [place] NEGATIVE 06/16/2020   [place] NEGATIVE 02/10/2020    Radiological Exams on Admission: DG Chest Portable 1 View  Result Date: 08/19/2021 CLINICAL DATA:  Shortness of breath. EXAM: PORTABLE CHEST 1 VIEW COMPARISON:  06/15/2020 FINDINGS: 1823 hours. Lungs are hyperexpanded. The lungs are clear without focal pneumonia, edema, pneumothorax or pleural effusion. The cardiopericardial silhouette is within normal limits for size. Multiple nonacute bilateral rib fractures evident. Telemetry leads overlie the chest. IMPRESSION: Hyperexpansion without acute cardiopulmonary findings. Electronically Signed   By: Misty Stanley M.D.   On: 08/19/2021 18:34    EKG: Independently reviewed.  Abnormal EKG with similar EKG to prior with changes of T wave inversions in the lateral leads Q waves in the anterior leads and artifact.  Echocardiogram 07/2020: 1. Left ventricular ejection fraction, by estimation, is 50 to 55%. Left ventricular ejection fraction by 3D volume is 54 %. The left ventricle has low normal function. The left ventricle has no regional wall motion abnormalities. Left ventricular diastolic parameters are consistent with Grade I diastolic dysfunction (impaired relaxation). 2. Right ventricular systolic function is normal. The right ventricular size is normal. 3. Left atrial size was mildly dilated. 4. The mitral valve is normal  in structure. Mild mitral valve regurgitation. 5. The aortic valve is grossly normal. Aortic valve regurgitation is not visualized. 6. The inferior vena cava is normal in size with greater than 50% respiratory variability, suggesting right atrial pressure of 3 mmHg.  Assessment/Plan: Principal Problem:   SOB (shortness of breath) Active Problems:   Acute respiratory failure with hypoxia (HCC)   Sepsis (HCC)   Chronic combined systolic and diastolic CHF (congestive heart failure) (HCC)   Tobacco abuse   CKD (chronic kidney disease) stage 3, GFR 30-59 ml/min (HCC)   GERD (gastroesophageal reflux disease)   Hypertension   Paroxysmal A-fib (HCC)   Prediabetes Shortness of breath/acute respiratory failure with hypoxia: Suspected patient has COPD.  Suspect COPD exacerbation and less likely CHF.  We will continue patient with COPD exacerbation regimen of steroids duo nebs supplemental oxygen and pulse oximetry.  Plantar hallux check prior to discharge for home oxygen therapy.  Incentive spirometry and PFTs once acute episode has resolved.  Progress note from June 26, 2021 when patient was seen by one of our primary care provider COPD is noted's assessment and plan.VBG pending.  SIRS / Sepsis: Blood pressure 132/67, pulse 99, temperature (!) 97.5 F (36.4 C), temperature source Oral, resp. rate 18, height 5\' 5"  (1.651 m), weight 54.9 kg, SpO2 92 %. HR of 102/ RR of 24/ WBC of 13.1. Pt meets sepsis criteria and started on abx regimen for possible bronchitis.    Chronic combined systolic and Congestive heart failure: Patient was on Entresto but is currently no longer on it and there is no diuretic regimen currently secondary to abnormal renal function. Well kept patient on empiric Lasix 20 mg IV single dose monitor strict I's and O's.  We will  obtain a 2D echocardiogram, cardiology consult per a.m. team as needed.  Tobacco abuse: Nicotine patch.  Counseling offered and advised to cutback  on cigarettes.   CKD: Lab Results  Component Value Date   CREATININE 1.63 (H) 08/19/2021   CREATININE 1.77 (H) 06/26/2021   CREATININE 1.49 (H) 02/23/2021  We will follow and renally dose needed meds,.  Avoid contrast studies. Losartan held currently as giving one time lasix.   GERD: Iv ppi.  Paroxysmal A.Fib: Currently in in sinus rhythm.  Prediabetes: Carb consistent and heart healthy diet and SSI.   DVT prophylaxis:  eliquis   Code Status:  Full code    Family Communication:  Summer,Jimmy (Spouse)  [phone] (Mobile)   Disposition Plan:  Home    Consults called:  None   Admission status: Inpatient.     Para Skeans MD [facility] [phone] How to contact the [facility] Attending or Consulting provider [place] [address] or covering provider during after hours [place], for this patient.    Check the care team in [facility] and look for a) attending/consulting [facility] provider listed and b) the [facility] team listed Log into [email or web address] and use [place]'s universal password to access. If you do not have the password, please contact the hospital operator. Locate the [facility] provider you are looking for under [facility] and page to a number that you can be directly reached. If you still have difficulty reaching the provider, please page the [facility] (Director on Call) for the Hospitalists listed on amion for assistance. [email or web address] Password [email or web address] 08/19/2021, 10:00 PM

## 2021-08-19 NOTE — ED Notes (Addendum)
Pts O2 sats 87-89% on 2L/min [place]. Pt bumped up to 4L/min with no change. Pt bumped up to 6L/min; O2 sats 89-90%. Msg sent to Dr. Posey Pronto.

## 2021-08-19 NOTE — ED Notes (Signed)
Gray on ice, lavender, light green tubes sent to lab.

## 2021-08-19 NOTE — ED Notes (Signed)
RT at BS.

## 2021-08-20 ENCOUNTER — Inpatient Hospital Stay (HOSPITAL_COMMUNITY)
Admit: 2021-08-20 | Discharge: 2021-08-20 | Disposition: A | Discharge status: Home / Self Care | Payer: Medicare Other | Attending: Internal Medicine | Admitting: Internal Medicine

## 2021-08-20 DIAGNOSIS — J9601 Acute respiratory failure with hypoxia: Secondary | ICD-10-CM | POA: Diagnosis not present

## 2021-08-20 DIAGNOSIS — E785 Hyperlipidemia, unspecified: Secondary | ICD-10-CM

## 2021-08-20 DIAGNOSIS — J441 Chronic obstructive pulmonary disease with (acute) exacerbation: Secondary | ICD-10-CM | POA: Diagnosis not present

## 2021-08-20 DIAGNOSIS — N1832 Chronic kidney disease, stage 3b: Secondary | ICD-10-CM | POA: Diagnosis not present

## 2021-08-20 DIAGNOSIS — I5031 Acute diastolic (congestive) heart failure: Secondary | ICD-10-CM

## 2021-08-20 DIAGNOSIS — I5042 Chronic combined systolic (congestive) and diastolic (congestive) heart failure: Secondary | ICD-10-CM | POA: Diagnosis not present

## 2021-08-20 DIAGNOSIS — K219 Gastro-esophageal reflux disease without esophagitis: Secondary | ICD-10-CM

## 2021-08-20 LAB — ECHOCARDIOGRAM COMPLETE
AR max vel: 2.16 cm2
AV Area VTI: 1.84 cm2
AV Area mean vel: 1.99 cm2
AV Mean grad: 4 mmHg
AV Peak grad: 6.8 mmHg
Ao pk vel: 1.3 m/s
Area-P 1/2: 4.27 cm2
Calc EF: 49.1 %
Height: 65 in
MV VTI: 1.98 cm2
S' Lateral: 3.58 cm
Single Plane A2C EF: 52.1 %
Single Plane A4C EF: 46.2 %
Weight: 1936 oz

## 2021-08-20 LAB — COMPREHENSIVE METABOLIC PANEL
ALT: 11 U/L (ref 0–44)
AST: 13 U/L — ABNORMAL LOW (ref 15–41)
Albumin: 3.7 g/dL (ref 3.5–5.0)
Alkaline Phosphatase: 71 U/L (ref 38–126)
Anion gap: 8 (ref 5–15)
BUN: 24 mg/dL — ABNORMAL HIGH (ref 8–23)
CO2: 25 mmol/L (ref 22–32)
Calcium: 8.8 mg/dL — ABNORMAL LOW (ref 8.9–10.3)
Chloride: 105 mmol/L (ref 98–111)
Creatinine, Ser: 1.58 mg/dL — ABNORMAL HIGH (ref 0.44–1.00)
GFR, Estimated: 36 mL/min — ABNORMAL LOW (ref >60)
Glucose, Bld: 148 mg/dL — ABNORMAL HIGH (ref 70–99)
Potassium: 4.2 mmol/L (ref 3.5–5.1)
Sodium: 138 mmol/L (ref 135–145)
Total Bilirubin: 0.5 mg/dL (ref 0.3–1.2)
Total Protein: 6.8 g/dL (ref 6.5–8.1)

## 2021-08-20 LAB — CBC
HCT: 36.9 % (ref 36.0–46.0)
Hemoglobin: 12.1 g/dL (ref 12.0–15.0)
MCH: 28.6 pg (ref 26.0–34.0)
MCHC: 32.8 g/dL (ref 30.0–36.0)
MCV: 87.2 fL (ref 80.0–100.0)
Platelets: 176 10*3/uL (ref 150–400)
RBC: 4.23 MIL/uL (ref 3.87–5.11)
RDW: 15.4 % (ref 11.5–15.5)
WBC: 6 10*3/uL (ref 4.0–10.5)
nRBC: 0 % (ref 0.0–0.2)

## 2021-08-20 LAB — CBG MONITORING, ED
Glucose-Capillary: 156 mg/dL — ABNORMAL HIGH (ref 70–99)
Glucose-Capillary: 141 mg/dL — ABNORMAL HIGH (ref 70–99)

## 2021-08-20 LAB — GLUCOSE, CAPILLARY
Glucose-Capillary: 161 mg/dL — ABNORMAL HIGH (ref 70–99)
Glucose-Capillary: 255 mg/dL — ABNORMAL HIGH (ref 70–99)

## 2021-08-20 LAB — HIV ANTIBODY (ROUTINE TESTING W REFLEX): HIV Screen 4th Generation wRfx: NONREACTIVE

## 2021-08-20 MED ORDER — METHYLPREDNISOLONE SODIUM SUCC 125 MG IJ SOLR
80.0000 mg | Freq: Every day | INTRAMUSCULAR | Status: DC
  Administered 2021-08-20 – 2021-08-22 (×3): 80 mg via INTRAVENOUS
  Filled 2021-08-20 (×2): qty 2

## 2021-08-20 MED ORDER — TORSEMIDE 20 MG PO TABS
20.0000 mg | ORAL_TABLET | Freq: Every day | ORAL | Status: DC
  Administered 2021-08-20 – 2021-08-27 (×8): 20 mg via ORAL
  Filled 2021-08-20 (×8): qty 1

## 2021-08-20 MED ORDER — ADULT MULTIVITAMIN W/MINERALS CH
1.0000 | ORAL_TABLET | Freq: Every day | ORAL | Status: DC
  Administered 2021-08-21 – 2021-08-27 (×7): 1 via ORAL
  Filled 2021-08-20 (×7): qty 1

## 2021-08-20 MED ORDER — GLUCERNA SHAKE PO LIQD
237.0000 mL | Freq: Three times a day (TID) | ORAL | Status: DC
  Administered 2021-08-20 (×2): 237 mL via ORAL

## 2021-08-20 MED ORDER — PANTOPRAZOLE SODIUM 40 MG PO TBEC
40.0000 mg | DELAYED_RELEASE_TABLET | Freq: Every day | ORAL | Status: DC
  Administered 2021-08-21 – 2021-08-27 (×7): 40 mg via ORAL
  Filled 2021-08-20 (×7): qty 1

## 2021-08-20 NOTE — Progress Notes (Signed)
*  PRELIMINARY RESULTS* Echocardiogram 2D Echocardiogram has been performed.  Dawn Sherman 08/20/2021, 10:17 AM

## 2021-08-20 NOTE — Progress Notes (Signed)
Initial Nutrition Assessment  DOCUMENTATION CODES:   Not applicable  INTERVENTION:   -Glucerna Shake po TID, each supplement provides 220 kcal and 10 grams of protein  -MVI with minerals daily  NUTRITION DIAGNOSIS:   Increased nutrient needs related to chronic illness (COPD) as evidenced by estimated needs.  GOAL:   Patient will meet greater than or equal to 90% of their needs  MONITOR:   PO intake, Supplement acceptance, Labs, Weight trends, Skin, I & O's  REASON FOR ASSESSMENT:   Consult Assessment of nutrition requirement/status  ASSESSMENT:   Dawn Sherman is a 65 y.o. female seen in ed with complaints of SOB. Pt has past medical history of congestive heart failure, chronic kidney disease, tobacco abuse, B12 deficiency, hypertension  Pt admitted with SOB likely related to COPD exacerbation.   Reviewed I/O's: +250 ml x 24 hours  Pt unavailable at time of visit. RD unable to obtain further nutrition-related history or complete nutrition-focused physical exam at this time.    Pt currently on a heart healthy/ carb modified diet. No meal completion data to assess at this time.   Reviewed wt hx; wt has been stable over the past 6 months.   Pt with increased nutritional needs for chronic illness and would benefit from addition of oral nutrition supplements.    Medications reviewed and include solu-medrol and demadex.    Lab Results  Component Value Date   HGBA1C 6.2 06/26/2021   PTA DM medications are none.   Labs reviewed: CBGS: 156 (inpatient orders for glycemic control are 0-9 units insulin aspart TID with meals).    Diet Order:   Diet Order             Diet heart healthy/carb modified Room service appropriate? Yes; Fluid consistency: Thin  Diet effective now                   EDUCATION NEEDS:   No education needs have been identified at this time  Skin:  Skin Assessment: Reviewed RN Assessment  Last BM:  Unknown  Height:   Ht Readings from  Last 1 Encounters:  08/19/21 5\' 5"  (1.651 m)    Weight:   Wt Readings from Last 1 Encounters:  08/19/21 54.9 kg    Ideal Body Weight:  56.8 kg  BMI:  Body mass index is 20.14 kg/m.  Estimated Nutritional Needs:   Kcal:  1650-1850  Protein:  75-90 grams  Fluid:  > 1.6 L    Loistine Chance, RD, LDN, [place] Registered Dietitian II Certified Diabetes Care and Education Specialist Please refer to [facility] for RD and/or RD on-call/weekend/after hours pager

## 2021-08-20 NOTE — Progress Notes (Signed)
Patient ID: Dawn Sherman, female   DOB: [birth year], 65 y.o.   MRN: [id number] [facility] PROGRESS NOTE  Dawn Sherman:[id number] DOB: 10-22-1955 DOA: 08/19/2021 PCP: [name], MD  HPI/Subjective:   Objective: Vitals:   08/20/21 0854 08/20/21 0930  BP: (!) 144/88 (!) 147/83  Pulse:  (!) 108  Resp:  18  Temp:    SpO2:  95%    Intake/Output Summary (Last 24 hours) at 08/20/2021 1256 Last data filed at 08/19/2021 2109 Gross per 24 hour  Intake 250 ml  Output --  Net 250 ml   Filed Weights   08/19/21 1752  Weight: 54.9 kg    ROS: Review of Systems  Respiratory:  Negative for shortness of breath.   Cardiovascular:  Negative for chest pain.  Gastrointestinal:  Negative for abdominal pain, nausea and vomiting.  Exam: Physical Exam HENT:     Head: Normocephalic.     Mouth/Throat:     Pharynx: No oropharyngeal exudate.  Eyes:     General: Lids are normal.     Conjunctiva/sclera: Conjunctivae normal.  Cardiovascular:     Rate and Rhythm: Normal rate and regular rhythm.     Heart sounds: Normal heart sounds, S1 normal and S2 normal.  Pulmonary:     Breath sounds: Examination of the right-middle field reveals wheezing. Examination of the left-middle field reveals wheezing. Examination of the right-lower field reveals decreased breath sounds and rhonchi. Examination of the left-lower field reveals decreased breath sounds and rhonchi. Decreased breath sounds, wheezing and rhonchi present. No rales.  Abdominal:     Palpations: Abdomen is soft.     Tenderness: There is no abdominal tenderness.  Musculoskeletal:     Right lower leg: No swelling.     Left lower leg: No swelling.  Skin:    General: Skin is warm.     Findings: No rash.  Neurological:     Mental Status: She is alert and oriented to person, place, and time.      Scheduled Meds:  amLODipine  10 mg Oral Daily   apixaban  2.5 mg Oral BID   atorvastatin  40 mg Oral Daily   azithromycin  500  mg Oral Daily   buPROPion  150 mg Oral Daily   carvedilol  25 mg Oral BID   feeding supplement (GLUCERNA SHAKE)  237 mL Oral TID BM   insulin aspart  0-9 Units Subcutaneous TID WC   ipratropium-albuterol  3 mL Nebulization Q6H   methylPREDNISolone (SOLU-MEDROL) injection  80 mg Intravenous Daily   multivitamin with minerals  1 tablet Oral Daily   nicotine  14 mg Transdermal Daily   pantoprazole (PROTONIX) IV  40 mg Intravenous Q12H   sodium chloride flush  3 mL Intravenous Q12H   sodium chloride flush  3 mL Intravenous Q12H   torsemide  20 mg Oral Daily   venlafaxine XR  150 mg Oral Daily   Continuous Infusions:  sodium chloride      Assessment/Plan:  Acute hypoxic respiratory failure.  Patient with a pulse ox of 77% on room air.  When I went in the room this morning she was on 10 L high flow nasal cannula.  I dialed her down to 5 L with saturations in the low 90s.  The patient does not wear oxygen at home.  Taper oxygen as needed. COPD exacerbation.  Start Solu-Medrol 80 mg daily.  Continue nebulizer treatments.  Empiric Zithromax Sepsis ruled out Chronic combined systolic and diastolic congestive  heart failure.  EF on latest echocardiogram normal range.  Patient on low-dose Coreg.  We will give oral torsemide. CKD stage III (borderline 3A/3B) GERD change IV Protonix to oral. Depression on Effexor Hyperlipidemia unspecified on atorvastatin Paroxysmal atrial fibrillation on Eliquis for anticoagulation and low-dose Coreg for rate control.      Code Status:     Code Status Orders  (From admission, onward)           Start     Ordered   08/19/21 1929  Full code  Continuous        08/19/21 1931           Code Status History     Date Active Date Inactive Code Status Order ID Comments User Context   06/16/2020 1210 06/19/2020 1703 Full Code [id number]  Ivor Costa, MD ED   02/10/2020 0954 02/13/2020 1719 Full Code [id number]  Ivor Costa, MD ED      Family  Communication: Left message for husband on the phone Disposition Plan: Status is: Inpatient  Antibiotics: Zithromax  Manoah Deckard [employer]

## 2021-08-21 ENCOUNTER — Telehealth: Payer: Self-pay | Admitting: Pharmacist

## 2021-08-21 ENCOUNTER — Telehealth: Payer: Medicare Other

## 2021-08-21 DIAGNOSIS — J9601 Acute respiratory failure with hypoxia: Secondary | ICD-10-CM | POA: Diagnosis not present

## 2021-08-21 DIAGNOSIS — I5042 Chronic combined systolic (congestive) and diastolic (congestive) heart failure: Secondary | ICD-10-CM | POA: Diagnosis not present

## 2021-08-21 DIAGNOSIS — N1832 Chronic kidney disease, stage 3b: Secondary | ICD-10-CM | POA: Diagnosis not present

## 2021-08-21 DIAGNOSIS — J441 Chronic obstructive pulmonary disease with (acute) exacerbation: Secondary | ICD-10-CM | POA: Diagnosis not present

## 2021-08-21 LAB — GLUCOSE, CAPILLARY
Glucose-Capillary: 103 mg/dL — ABNORMAL HIGH (ref 70–99)
Glucose-Capillary: 110 mg/dL — ABNORMAL HIGH (ref 70–99)
Glucose-Capillary: 209 mg/dL — ABNORMAL HIGH (ref 70–99)
Glucose-Capillary: 168 mg/dL — ABNORMAL HIGH (ref 70–99)

## 2021-08-21 NOTE — Progress Notes (Signed)
Patient ID: Dawn Sherman, female   DOB: [birth year], 65 y.o.   MRN: [id number] [facility] PROGRESS NOTE  LARRIE LUCIA KYH:[id number] DOB: [birth year] DOA: 08/19/2021 PCP: [name], MD  HPI/Subjective: Patient feeling a little bit better.  Admitted with acute hypoxic respiratory failure initially requiring 10 L high flow nasal cannula.  This morning was down to 3 L nasal cannula.  Still with coughing and wheezing.  Objective: Vitals:   08/21/21 1157 08/21/21 1501  BP: 135/67   Pulse: 87 97  Resp:  18  Temp: 98 F (36.7 C)   SpO2: 92% 91%    Intake/Output Summary (Last 24 hours) at 08/21/2021 1546 Last data filed at 08/21/2021 1300 Gross per 24 hour  Intake 720 ml  Output --  Net 720 ml   Filed Weights   08/19/21 1752  Weight: 54.9 kg    ROS: Review of Systems  Respiratory:  Positive for cough, shortness of breath and wheezing.   Cardiovascular:  Negative for chest pain.  Gastrointestinal:  Negative for abdominal pain, nausea and vomiting.  Exam: Physical Exam HENT:     Head: Normocephalic.     Mouth/Throat:     Pharynx: No oropharyngeal exudate.  Eyes:     General: Lids are normal.     Conjunctiva/sclera: Conjunctivae normal.  Cardiovascular:     Rate and Rhythm: Normal rate and regular rhythm.     Heart sounds: Normal heart sounds, S1 normal and S2 normal.  Pulmonary:     Breath sounds: Examination of the right-middle field reveals wheezing. Examination of the left-middle field reveals wheezing. Examination of the right-lower field reveals decreased breath sounds and wheezing. Examination of the left-lower field reveals decreased breath sounds and wheezing. Decreased breath sounds and wheezing present. No rhonchi or rales.  Abdominal:     Palpations: Abdomen is soft.     Tenderness: There is no abdominal tenderness.  Musculoskeletal:     Right ankle: No swelling.     Left ankle: No swelling.  Skin:    General: Skin is warm.     Findings: No  rash.  Neurological:     Mental Status: She is alert and oriented to person, place, and time.      Scheduled Meds:  amLODipine  10 mg Oral Daily   apixaban  2.5 mg Oral BID   atorvastatin  40 mg Oral Daily   azithromycin  500 mg Oral Daily   buPROPion  150 mg Oral Daily   carvedilol  25 mg Oral BID   feeding supplement (GLUCERNA SHAKE)  237 mL Oral TID BM   insulin aspart  0-9 Units Subcutaneous TID WC   ipratropium-albuterol  3 mL Nebulization Q6H   methylPREDNISolone (SOLU-MEDROL) injection  80 mg Intravenous Daily   multivitamin with minerals  1 tablet Oral Daily   nicotine  14 mg Transdermal Daily   pantoprazole  40 mg Oral Daily   sodium chloride flush  3 mL Intravenous Q12H   sodium chloride flush  3 mL Intravenous Q12H   torsemide  20 mg Oral Daily   venlafaxine XR  150 mg Oral Daily   Continuous Infusions:  sodium chloride     Brief history: 65 year old female with past medical history of CKD, CHF, hypertension and hyperlipidemia presents with shortness of breath.  She was found to be hypoxic and required initially 10 L high flow nasal cannula.  Being treated for COPD exacerbation.  Assessment/Plan:  Acute hypoxic respiratory failure.  Patient had an  initial pulse ox of 77% on room air.  She was on 10 L high flow nasal cannula yesterday and was dialed down to 5 L nasal cannula high flow.  Today on 3 L regular nasal cannula.  Unable to come off oxygen today.  Try to get off oxygen tomorrow.  Since she does not wear oxygen at home I will try to get her off oxygen prior to disposition COPD exacerbation.  Continue Solu-Medrol 80 mg daily.  Continue nebulizer treatments and empiric Zithromax.  Still having quite a bit of wheezing in the lungs. Sepsis ruled out. Chronic combined systolic and diastolic congestive heart failure.  Echocardiogram showed a normal ejection fraction.  Patient on low-dose Coreg.  I prescribed low-dose torsemide.  More likely COPD exacerbation and  CHF. Chronic disease stage III (borderline 3A/3B).  Recheck creatinine tomorrow. GERD on Protonix Depression on Effexor Hyperlipidemia unspecified on atorvastatin Paroxysmal atrial fibrillation on Eliquis for anticoagulation and low-dose Coreg for rate control        Code Status:     Code Status Orders  (From admission, onward)           Start     Ordered   08/19/21 1929  Full code  Continuous        08/19/21 1931           Code Status History     Date Active Date Inactive Code Status Order ID Comments User Context   06/16/2020 1210 06/19/2020 1703 Full Code [id number]  Ivor Costa, MD ED   02/10/2020 0954 02/13/2020 1719 Full Code [id number]  Ivor Costa, MD ED      Family Communication: Updated husband on the phone Disposition Plan: Status is: Inpatient  Antibiotics: Zithromax  [place] Dineen [employer]

## 2021-08-21 NOTE — Plan of Care (Signed)

## 2021-08-21 NOTE — Progress Notes (Signed)
SATURATION QUALIFICATIONS: (This note is used to comply with regulatory documentation for home oxygen)  Patient  Saturations on 3 Liter at Rest:93%  Patient Saturations on Room Air at Rest = 88%  Patient Saturations on Room Air while Ambulating = 80%  Patient Saturations on 3 Liters of oxygen while Ambulating = 88%  Please briefly explain why patient needs home oxygen: Patient was symptomatic after removing oxygen while ambulating. She stated she felt weakness in her legs and light headed. It took several minutes for patient to recover once back on O2 at rest.

## 2021-08-21 NOTE — Telephone Encounter (Signed)
°  Chronic Care Management   Note  08/21/2021 Name: Dawn Sherman MRN: [id number] DOB: [birth year]   CCM appointment scheduled today, but currently admitted for COPD exacerbation. Will follow for discharge and will reschedule appointment.   Catie Darnelle Maffucci, PharmD, [place], [place] Clinical Pharmacist [employer] [facility] [employer] [phone]

## 2021-08-21 NOTE — TOC Initial Note (Signed)
Transition of Care [facility]) - Initial/Assessment Note    Patient Details  Name: Dawn Sherman MRN: [id number] Date of Birth: January 29, 1956  [facility] CM/SW Contact:    Candie Chroman, LCSW Phone Number: 08/21/2021, 9:16 AM  Clinical Narrative:  This CSW working remote today. CSW called patient in the room, introduced role, and explained that discharge planning would be discussed. Patient lives home with her husband and 72 year old grandson. PCP is [name], MD at [facility]. Patient drives herself to appointments. [facility] [place]. No issues obtaining medications. No home health or DME use prior to admission. Patient is not on oxygen at home. Currently on 3 L. Patient has a scale at home and understands the importance of weighing daily to monitor fluid buildup. No further concerns. CSW encouraged patient to contact CSW as needed. CSW will continue to follow patient for support and facilitate return home when stable.                Expected Discharge Plan: Home/Self Care Barriers to Discharge: Continued Medical Work up   Patient Goals and CMS Choice     Choice offered to / list presented to : NA  Expected Discharge Plan and Services Expected Discharge Plan: Home/Self Care     Post Acute Care Choice: NA Living arrangements for the past 2 months: Single Family Home                                      Prior Living Arrangements/Services Living arrangements for the past 2 months: Single Family Home Lives with:: Spouse, Relatives Patient language and need for interpreter reviewed:: Yes Do you feel safe going back to the place where you live?: Yes      Need for Family Participation in Patient Care: Yes (Comment) Care giver support system in place?: Yes (comment)   Criminal Activity/Legal Involvement Pertinent to Current Situation/Hospitalization: No - Comment as needed  Activities of Daily Living Home Assistive Devices/Equipment: [name]  (specify type) ADL Screening (condition at time of admission) Patient's cognitive ability adequate to safely complete daily activities?: Yes Is the patient deaf or have difficulty hearing?: No Does the patient have difficulty seeing, even when wearing glasses/contacts?: No Does the patient have difficulty concentrating, remembering, or making decisions?: No Patient able to express need for assistance with ADLs?: Yes Does the patient have difficulty dressing or bathing?: No Independently performs ADLs?: Yes (appropriate for developmental age) Does the patient have difficulty walking or climbing stairs?: Yes Weakness of Legs: None Weakness of Arms/Hands: None  Permission Sought/Granted                  Emotional Assessment Appearance:: Appears stated age Attitude/Demeanor/Rapport: Engaged, Gracious Affect (typically observed): Accepting, Appropriate, Calm, Pleasant Orientation: : Oriented to Self, Oriented to Place, Oriented to  Time, Oriented to Situation Alcohol / Substance Use: Not Applicable Psych Involvement: No (comment)  Admission diagnosis:  COPD exacerbation ([place]) [J44.1] Acute respiratory failure with hypoxia (HCC) [J96.01] Patient Active Problem List   Diagnosis Date Noted   COPD exacerbation ([place]) 08/20/2021   Prediabetes 08/19/2021   SOB (shortness of breath) 08/19/2021   Sepsis ([place]) 08/19/2021   Chronic obstructive pulmonary disease ([place]) 11/22/2020   CKD (chronic kidney disease) stage 3, GFR 30-59 ml/min (HCC) 08/12/2020   Paroxysmal A-fib ([place]) 08/12/2020   Dehydration    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[place])  Chronic combined systolic and diastolic CHF (congestive heart failure) ([place]) 02/10/2020   HLD (hyperlipidemia) 02/10/2020   Elevated troponin 02/10/2020   GERD (gastroesophageal reflux disease) 02/10/2020   Acute respiratory failure with hypoxia ([place]) 02/10/2020   Abnormal mammogram 07/13/2019   Sleep concern 10/17/2018   History of alcohol  abuse 07/31/2018   B12 deficiency 07/31/2018   Acute renal failure superimposed on stage 3b chronic kidney disease ([place]) 11/03/2016   Health care maintenance 12/02/2014   Tobacco abuse 12/02/2014   Foot pain, left 11/29/2014   Hypercholesterolemia 11/29/2014   Encounter for screening colonoscopy 12/16/2013   Breast mass 12/15/2012   Hypertension 07/05/2012   Mild depression 07/05/2012   PCP:  [name], MD Pharmacy[facility]   [id number] [place], [place] [address] Phone: [phone] Fax: [phone]     Social Determinants of Health (SDOH) Interventions    Readmission Risk Interventions No flowsheet data found.

## 2021-08-22 DIAGNOSIS — R0602 Shortness of breath: Secondary | ICD-10-CM | POA: Diagnosis not present

## 2021-08-22 LAB — GLUCOSE, CAPILLARY
Glucose-Capillary: 91 mg/dL (ref 70–99)
Glucose-Capillary: 65 mg/dL — ABNORMAL LOW (ref 70–99)
Glucose-Capillary: 95 mg/dL (ref 70–99)
Glucose-Capillary: 161 mg/dL — ABNORMAL HIGH (ref 70–99)

## 2021-08-22 LAB — BASIC METABOLIC PANEL
Anion gap: 10 (ref 5–15)
BUN: 50 mg/dL — ABNORMAL HIGH (ref 8–23)
CO2: 25 mmol/L (ref 22–32)
Calcium: 9.3 mg/dL (ref 8.9–10.3)
Chloride: 101 mmol/L (ref 98–111)
Creatinine, Ser: 1.74 mg/dL — ABNORMAL HIGH (ref 0.44–1.00)
GFR, Estimated: 32 mL/min — ABNORMAL LOW (ref >60)
Glucose, Bld: 112 mg/dL — ABNORMAL HIGH (ref 70–99)
Potassium: 4.6 mmol/L (ref 3.5–5.1)
Sodium: 136 mmol/L (ref 135–145)

## 2021-08-22 LAB — CBC
HCT: 38.1 % (ref 36.0–46.0)
Hemoglobin: 12.6 g/dL (ref 12.0–15.0)
MCH: 28.9 pg (ref 26.0–34.0)
MCHC: 33.1 g/dL (ref 30.0–36.0)
MCV: 87.4 fL (ref 80.0–100.0)
Platelets: 193 10*3/uL (ref 150–400)
RBC: 4.36 MIL/uL (ref 3.87–5.11)
RDW: 14.9 % (ref 11.5–15.5)
WBC: 13.6 10*3/uL — ABNORMAL HIGH (ref 4.0–10.5)
nRBC: 0 % (ref 0.0–0.2)

## 2021-08-22 LAB — MAGNESIUM: Magnesium: 2 mg/dL (ref 1.7–2.4)

## 2021-08-22 MED ORDER — FLUTICASONE FUROATE-VILANTEROL 200-25 MCG/ACT IN AEPB
1.0000 | INHALATION_SPRAY | Freq: Every day | RESPIRATORY_TRACT | Status: DC
  Administered 2021-08-22 – 2021-08-27 (×6): 1 via RESPIRATORY_TRACT
  Filled 2021-08-22: qty 28

## 2021-08-22 MED ORDER — METHYLPREDNISOLONE SODIUM SUCC 40 MG IJ SOLR
40.0000 mg | Freq: Every day | INTRAMUSCULAR | Status: DC
  Administered 2021-08-24 – 2021-08-27 (×4): 40 mg via INTRAVENOUS
  Filled 2021-08-22 (×4): qty 1

## 2021-08-22 MED ORDER — METHYLPREDNISOLONE SODIUM SUCC 125 MG IJ SOLR
60.0000 mg | Freq: Every day | INTRAMUSCULAR | Status: AC
  Administered 2021-08-23: 10:00:00 60 mg via INTRAVENOUS
  Filled 2021-08-22: qty 2

## 2021-08-22 MED ORDER — TIOTROPIUM BROMIDE MONOHYDRATE 18 MCG IN CAPS
18.0000 ug | ORAL_CAPSULE | Freq: Every day | RESPIRATORY_TRACT | Status: DC
  Administered 2021-08-22 – 2021-08-27 (×5): 18 ug via RESPIRATORY_TRACT
  Filled 2021-08-22: qty 5

## 2021-08-22 NOTE — Progress Notes (Signed)
PROGRESS NOTE    Dawn Sherman  STM:[id number] DOB: 12-13-1955 DOA: 08/19/2021 PCP: [name], MD   Brief Narrative:  Pt with past medical history of congestive heart failure, chronic kidney disease 3B, tobacco abuse, B12 deficiency, hypertension presents with shortness of breath, worse with exertion, minimal swelling in legs but no orthopnea.   Assessment & Plan:   Acute hypoxic respiratory failure secondary to COPD exacerbation, POA Cannot rule out concurrent heart failure exacerbation(see below) POA.   -Admitted on 10 L high flow -continue weaning oxygen as tolerated, baseline patient is on room air -Continues to be hypoxic at rest as well as worse with exertion. -Continue steroid taper, empiric azithromycin -Sepsis ruled out.  Chronic combined systolic and diastolic congestive heart failure. -Echocardiogram showed a normal ejection fraction.   -Continue carvedilol, torsemide   Chronic disease stage IIIb.  -Follow repeat labs, creatinine stable  GERD on Protonix  Depression on Effexor  Hyperlipidemia on atorvastatin  Paroxysmal atrial fibrillation on Eliquis for anticoagulation and low-dose Coreg for rate control  DVT prophylaxis: Eliquis Code Status: Full Family Communication: At bedside  Status is: Inpatient  Dispo: The patient is from: Home              Anticipated d/c is to: Home              Anticipated d/c date is: 24 to 48 hours              Patient currently not medically stable for discharge  Consultants:  None  Procedures:  None  Antimicrobials:  Azithromycin  Subjective: No acute issues or events overnight, respiratory status improving but not yet back to baseline  Objective: Vitals:   08/22/21 0157 08/22/21 0500 08/22/21 0507 08/22/21 0550  BP:   133/68   Pulse:   85   Resp: 15 17 19 18   Temp:   98.2 F (36.8 C)   TempSrc:      SpO2:   95%   Weight:      Height:        Intake/Output Summary (Last 24 hours) at 08/22/2021  0703 Last data filed at 08/21/2021 1700 Gross per 24 hour  Intake 720 ml  Output --  Net 720 ml   Filed Weights   08/19/21 1752  Weight: 54.9 kg    Examination:  General:  Pleasantly resting in bed, No acute distress. HEENT:  Normocephalic atraumatic.  Sclerae nonicteric, noninjected.  Extraocular movements intact bilaterally. Neck:  Without mass or deformity.  Trachea is midline. Lungs: Profoundly diminished breath sounds bilaterally without overt rales. Heart:  Regular rate and rhythm.  Without murmurs, rubs, or gallops. Abdomen:  Soft, nontender, nondistended.  Without guarding or rebound. Extremities: Without cyanosis, clubbing, edema, or obvious deformity. Vascular:  Dorsalis pedis and posterior tibial pulses palpable bilaterally. Skin:  Warm and dry, no erythema, no ulcerations.   Data Reviewed: I have personally reviewed following labs and imaging studies  CBC: Recent Labs  Lab 08/19/21 1803 08/20/21 0634 08/22/21 0440  WBC 13.1* 6.0 13.6*  NEUTROABS 9.3*  --   --   HGB 13.4 12.1 12.6  HCT 40.8 36.9 38.1  MCV 89.1 87.2 87.4  PLT 210 176 892   Basic Metabolic Panel: Recent Labs  Lab 08/19/21 1803 08/20/21 0634 08/22/21 0440  NA 138 138 136  K 4.4 4.2 4.6  CL 105 105 101  CO2 25 25 25   GLUCOSE 86 148* 112*  BUN 23 24* 50*  CREATININE 1.63* 1.58*  1.74*  CALCIUM 9.0 8.8* 9.3  MG  --   --  2.0   GFR: Estimated Creatinine Clearance: 27.6 mL/min (A) (by C-G formula based on SCr of 1.74 mg/dL (H)). Liver Function Tests: Recent Labs  Lab 08/19/21 1803 08/20/21 0634  AST 16 13*  ALT 13 11  ALKPHOS 74 71  BILITOT 0.6 0.5  PROT 7.6 6.8  ALBUMIN 4.1 3.7   No results for input(s): LIPASE, AMYLASE in the last 168 hours. No results for input(s): AMMONIA in the last 168 hours. Coagulation Profile: No results for input(s): INR, PROTIME in the last 168 hours. Cardiac Enzymes: No results for input(s): CKTOTAL, CKMB, CKMBINDEX, TROPONINI in the last 168  hours. BNP (last 3 results) No results for input(s): PROBNP in the last 8760 hours. HbA1C: No results for input(s): HGBA1C in the last 72 hours. CBG: Recent Labs  Lab 08/20/21 2107 08/21/21 0802 08/21/21 1158 08/21/21 1628 08/21/21 2002  GLUCAP 255* 103* 110* 209* 168*   Lipid Profile: No results for input(s): CHOL, HDL, LDLCALC, TRIG, CHOLHDL, LDLDIRECT in the last 72 hours. Thyroid Function Tests: No results for input(s): TSH, T4TOTAL, FREET4, T3FREE, THYROIDAB in the last 72 hours. Anemia Panel: No results for input(s): VITAMINB12, FOLATE, FERRITIN, TIBC, IRON, RETICCTPCT in the last 72 hours. Sepsis Labs: No results for input(s): PROCALCITON, LATICACIDVEN in the last 168 hours.  Recent Results (from the past 240 hour(s))  Resp Panel by RT-PCR (Flu A&B, Covid) Nasopharyngeal Swab     Status: None   Collection Time: 08/19/21  6:03 PM   Specimen: Nasopharyngeal Swab; Nasopharyngeal(NP) swabs in vial transport medium  Result Value Ref Range Status   SARS Coronavirus 2 by RT PCR NEGATIVE NEGATIVE Final    Comment: (NOTE) SARS-CoV-2 target nucleic acids are NOT DETECTED.  The SARS-CoV-2 RNA is generally detectable in upper respiratory specimens during the acute phase of infection. The lowest concentration of SARS-CoV-2 viral copies this assay can detect is 138 copies/mL. A negative result does not preclude SARS-Cov-2 infection and should not be used as the sole basis for treatment or other patient management decisions. A negative result may occur with  improper specimen collection/handling, submission of specimen other than nasopharyngeal swab, presence of viral mutation(s) within the areas targeted by this assay, and inadequate number of viral copies(<138 copies/mL). A negative result must be combined with clinical observations, patient history, and epidemiological information. The expected result is Negative.  Fact Sheet for Patients:   EntrepreneurPulse.com.au  Fact Sheet for Healthcare Providers:  IncredibleEmployment.be  This test is no t yet approved or cleared by the [place] FDA and  has been authorized for detection and/or diagnosis of SARS-CoV-2 by FDA under an Emergency Use Authorization (EUA). This EUA will remain  in effect (meaning this test can be used) for the duration of the COVID-19 declaration under Section 564(b)(1) of the Act, 21 U.S.C.section 360bbb-3(b)(1), unless the authorization is terminated  or revoked sooner.       Influenza A by PCR NEGATIVE NEGATIVE Final   Influenza B by PCR NEGATIVE NEGATIVE Final    Comment: (NOTE) The Xpert Xpress SARS-CoV-2/FLU/RSV plus assay is intended as an aid in the diagnosis of influenza from Nasopharyngeal swab specimens and should not be used as a sole basis for treatment. Nasal washings and aspirates are unacceptable for Xpert Xpress SARS-CoV-2/FLU/RSV testing.  Fact Sheet for Patients: EntrepreneurPulse.com.au  Fact Sheet for Healthcare Providers: IncredibleEmployment.be  This test is not yet approved or cleared by the [place] and  has been authorized for detection and/or diagnosis of SARS-CoV-2 by FDA under an Emergency Use Authorization (EUA). This EUA will remain in effect (meaning this test can be used) for the duration of the COVID-19 declaration under Section 564(b)(1) of the Act, 21 U.S.C. section 360bbb-3(b)(1), unless the authorization is terminated or revoked.  Performed at [facility], [address], [place], [place] [address]          Radiology Studies: ECHOCARDIOGRAM COMPLETE  Result Date: 08/20/2021    ECHOCARDIOGRAM REPORT   Patient Name:   Dawn Sherman Date of Exam: 08/20/2021 Medical Rec #:  [id number]     Height:       65.0 in Accession #:    [id number]    Weight:       121.0 lb Date of Birth:  Aug 23, 1956    BSA:           1.598 m Patient Age:    65 years      BP:           147/83 mmHg Patient Gender: F             HR:           102 bpm. Exam Location:  [facility] Procedure: 2D Echo, Color Doppler and Cardiac Doppler Indications:     I50.31 congestive heart failure-Acute Diastolic  History:         Patient has prior history of Echocardiogram examinations, most                  recent 08/09/2020. CHF, CKD; Risk Factors:Current Smoker,                  Hypertension and Dyslipidemia.  Sonographer:     Charmayne Sheer Referring Phys:  [place] Diagnosing Phys: Kathlyn Sacramento MD  Sonographer Comments: No subcostal window. IMPRESSIONS  1. Left ventricular ejection fraction, by estimation, is 50 to 55%. The left ventricle has low normal function. The left ventricle has no regional wall motion abnormalities. The left ventricular internal cavity size was mildly dilated. Left ventricular diastolic parameters are indeterminate.  2. Right ventricular systolic function is normal. The right ventricular size is normal. Tricuspid regurgitation signal is inadequate for assessing PA pressure.  3. The mitral valve is normal in structure. No evidence of mitral valve regurgitation. No evidence of mitral stenosis.  4. The aortic valve is normal in structure. Aortic valve regurgitation is not visualized. Aortic valve sclerosis is present, with no evidence of aortic valve stenosis. Comparison(s): A prior study was performed on 02/10/2020. Prior images reviewed side by side. Changes from prior study are noted. LV systolic function improved. FINDINGS  Left Ventricle: Left ventricular ejection fraction, by estimation, is 50 to 55%. The left ventricle has low normal function. The left ventricle has no regional wall motion abnormalities. The left ventricular internal cavity size was mildly dilated. There is no left ventricular hypertrophy. Left ventricular diastolic parameters are indeterminate. Right Ventricle: The right ventricular size is normal. No increase  in right ventricular wall thickness. Right ventricular systolic function is normal. Tricuspid regurgitation signal is inadequate for assessing PA pressure. Left Atrium: Left atrial size was normal in size. Right Atrium: Right atrial size was normal in size. Pericardium: There is no evidence of pericardial effusion. Mitral Valve: The mitral valve is normal in structure. Mild mitral annular calcification. No evidence of mitral valve regurgitation. No evidence of mitral valve stenosis. MV peak gradient, 9.2 mmHg. The mean mitral  valve gradient is 4.0 mmHg. Tricuspid Valve: The tricuspid valve is normal in structure. Tricuspid valve regurgitation is not demonstrated. No evidence of tricuspid stenosis. Aortic Valve: The aortic valve is normal in structure. Aortic valve regurgitation is not visualized. Aortic valve sclerosis is present, with no evidence of aortic valve stenosis. Aortic valve mean gradient measures 4.0 mmHg. Aortic valve peak gradient measures 6.8 mmHg. Aortic valve area, by VTI measures 1.84 cm. Pulmonic Valve: The pulmonic valve was normal in structure. Pulmonic valve regurgitation is not visualized. No evidence of pulmonic stenosis. Aorta: The aortic root is normal in size and structure. Venous: The inferior vena cava was not well visualized. IAS/Shunts: No atrial level shunt detected by color flow Doppler.  LEFT VENTRICLE PLAX 2D LVIDd:         5.16 cm     Diastology LVIDs:         3.58 cm     LV e' medial:    12.90 cm/s LV PW:         1.03 cm     LV E/e' medial:  9.6 LV IVS:        0.70 cm     LV e' lateral:   9.36 cm/s LVOT diam:     2.00 cm     LV E/e' lateral: 13.2 LV SV:         45 LV SV Index:   28 LVOT Area:     3.14 cm  LV Volumes (MOD) LV vol d, MOD A2C: 72.3 ml LV vol d, MOD A4C: 74.7 ml LV vol s, MOD A2C: 34.6 ml LV vol s, MOD A4C: 40.2 ml LV SV MOD A2C:     37.7 ml LV SV MOD A4C:     74.7 ml LV SV MOD BP:      37.1 ml RIGHT VENTRICLE RV Basal diam:  2.80 cm LEFT ATRIUM             Index         RIGHT ATRIUM           Index LA diam:        4.00 cm 2.50 cm/m   RA Area:     10.50 cm LA Vol (A2C):   42.4 ml 26.54 ml/m  RA Volume:   23.60 ml  14.77 ml/m LA Vol (A4C):   39.1 ml 24.47 ml/m LA Biplane Vol: 41.5 ml 25.97 ml/m  AORTIC VALVE                    PULMONIC VALVE AV Area (Vmax):    2.16 cm     PV Vmax:       0.99 m/s AV Area (Vmean):   1.99 cm     PV Vmean:      71.200 cm/s AV Area (VTI):     1.84 cm     PV VTI:        0.157 m AV Vmax:           130.00 cm/s  PV Peak grad:  3.9 mmHg AV Vmean:          94.400 cm/s  PV Mean grad:  2.0 mmHg AV VTI:            0.242 m AV Peak Grad:      6.8 mmHg AV Mean Grad:      4.0 mmHg LVOT Vmax:         89.40 cm/s LVOT Vmean:  59.900 cm/s LVOT VTI:          0.142 m LVOT/AV VTI ratio: 0.59  AORTA Ao Root diam: 3.50 cm MITRAL VALVE MV Area (PHT): 4.27 cm     SHUNTS MV Area VTI:   1.98 cm     Systemic VTI:  0.14 m MV Peak grad:  9.2 mmHg     Systemic Diam: 2.00 cm MV Mean grad:  4.0 mmHg MV Vmax:       1.52 m/s MV Vmean:      86.7 cm/s MV Decel Time: 178 msec MV E velocity: 123.50 cm/s Kathlyn Sacramento MD Electronically signed by Kathlyn Sacramento MD Signature Date/Time: 08/20/2021/10:56:16 AM    Final         Scheduled Meds:  amLODipine  10 mg Oral Daily   apixaban  2.5 mg Oral BID   atorvastatin  40 mg Oral Daily   azithromycin  500 mg Oral Daily   buPROPion  150 mg Oral Daily   carvedilol  25 mg Oral BID   feeding supplement (GLUCERNA SHAKE)  237 mL Oral TID BM   insulin aspart  0-9 Units Subcutaneous TID WC   ipratropium-albuterol  3 mL Nebulization Q6H   methylPREDNISolone (SOLU-MEDROL) injection  80 mg Intravenous Daily   multivitamin with minerals  1 tablet Oral Daily   nicotine  14 mg Transdermal Daily   pantoprazole  40 mg Oral Daily   sodium chloride flush  3 mL Intravenous Q12H   sodium chloride flush  3 mL Intravenous Q12H   torsemide  20 mg Oral Daily   venlafaxine XR  150 mg Oral Daily   Continuous Infusions:  sodium  chloride       LOS: 3 days   Time spent: 54min  Taten Merrow C Tylek Boney, DO [facility]  If 7PM-7AM, please contact night-coverage [email or web address]  08/22/2021, 7:03 AM

## 2021-08-22 NOTE — Plan of Care (Signed)

## 2021-08-22 NOTE — Progress Notes (Signed)
SATURATION QUALIFICATIONS: (This note is used to comply with regulatory documentation for home oxygen)  Patient Saturations on Room Air at Rest = 92%  Patient Saturations on Room Air while Ambulating = 82%  Patient Saturations on 3 Liters of oxygen while Ambulating = 95%  Please briefly explain why patient needs home oxygen: Patient still drops her oxygen levels significantly while ambulating on room air. It takes several minutes to recover once back on oxygen.

## 2021-08-23 DIAGNOSIS — R0602 Shortness of breath: Secondary | ICD-10-CM | POA: Diagnosis not present

## 2021-08-23 LAB — BASIC METABOLIC PANEL
Anion gap: 8 (ref 5–15)
BUN: 56 mg/dL — ABNORMAL HIGH (ref 8–23)
CO2: 26 mmol/L (ref 22–32)
Calcium: 8.9 mg/dL (ref 8.9–10.3)
Chloride: 101 mmol/L (ref 98–111)
Creatinine, Ser: 1.69 mg/dL — ABNORMAL HIGH (ref 0.44–1.00)
GFR, Estimated: 33 mL/min — ABNORMAL LOW (ref >60)
Glucose, Bld: 103 mg/dL — ABNORMAL HIGH (ref 70–99)
Potassium: 4.7 mmol/L (ref 3.5–5.1)
Sodium: 135 mmol/L (ref 135–145)

## 2021-08-23 LAB — CBC
HCT: 38.2 % (ref 36.0–46.0)
Hemoglobin: 12.6 g/dL (ref 12.0–15.0)
MCH: 28.4 pg (ref 26.0–34.0)
MCHC: 33 g/dL (ref 30.0–36.0)
MCV: 86 fL (ref 80.0–100.0)
Platelets: 216 10*3/uL (ref 150–400)
RBC: 4.44 MIL/uL (ref 3.87–5.11)
RDW: 14.7 % (ref 11.5–15.5)
WBC: 12.3 10*3/uL — ABNORMAL HIGH (ref 4.0–10.5)
nRBC: 0 % (ref 0.0–0.2)

## 2021-08-23 LAB — GLUCOSE, CAPILLARY
Glucose-Capillary: 77 mg/dL (ref 70–99)
Glucose-Capillary: 93 mg/dL (ref 70–99)
Glucose-Capillary: 198 mg/dL — ABNORMAL HIGH (ref 70–99)
Glucose-Capillary: 196 mg/dL — ABNORMAL HIGH (ref 70–99)

## 2021-08-23 MED ORDER — IPRATROPIUM-ALBUTEROL 0.5-2.5 (3) MG/3ML IN SOLN
3.0000 mL | Freq: Three times a day (TID) | RESPIRATORY_TRACT | Status: DC
  Administered 2021-08-24: 08:00:00 3 mL via RESPIRATORY_TRACT
  Filled 2021-08-23: qty 3

## 2021-08-23 NOTE — Consult Note (Signed)
° °  Heart Failure Nurse Navigator Note  HFpEF 50-55%.  Grade 1 diastolic dysfunction.  Normal right ventricular systolic function.  She presented to the emergency room with complaints of increasing shortness of breath and ankle swelling.  Comorbidities:  Chronic kidney disease Hypertension Hyperlipidemia GERD Paroxysmal atrial fibrillation on anticoagulation Tobacco abuse Medications:  Atorvastatin 40 mg daily Eliquis 2.5 mg 2 times a day Amlodipine 10 mg daily Carvedilol 25 mg 2 times a day Torsemide 20 mg daily  Labs:  Sodium 136, potassium 4.6, chloride 101, CO2 25, BUN 50, creatinine 1.74, magnesium 2, admission BNP 378.  Initial meeting with patient today.  She lives at home with her husband and 48 year old grandson.  She states that her husband works various shifts, lasting 12 hours.  She states that transportation may be a problem with getting to some appointments.  Explained to the that we do have [facility] transportation and she does live in [place].  Also went over how she takes care of her self at home.  She does have a scale and weighs herself daily.  Stressed the importance of reporting 2 to 3 pound weight gain overnight or 5 pounds within the week.  Also discussed signs and symptoms to report such as increasing lower extremity edema, abdominal swelling and increasing shortness of breath, PND and orthopnea.  She states that she does follow a low-sodium diet as her kidney doctor has placed her on what she thought was a 200 mg sodium restriction daily.  Daughter is at [employer] and does make her low sodium soups for in the winter he realizes that the canned are very high in sodium.   Went over sticking with the fluid restriction of no more than 64 ounces in a 24-hour period.  Minded that 1 cup of ice is equal to 1/2 cup of fluid.  Discussed follow-up in the outpatient heart failure clinic she has an appointment for January 10 at 4 PM.  She has an 11% history of no-show  which is 9 out of 85 appointments.  She had no further questions, was giving living with heart failure teaching booklet, zone magnet and information on low-sodium.  Pricilla Riffle RN [facility]

## 2021-08-23 NOTE — Care Management Important Message (Signed)
Important Message  Patient Details  Name: Dawn Sherman MRN: [id number] Date of Birth: 08-05-1956   Medicare Important Message Given:  Yes     [name] [name] 08/23/2021, 3:04 PM

## 2021-08-23 NOTE — Progress Notes (Signed)
PROGRESS NOTE    Dawn Sherman  YPP:[id number] DOB: Feb 14, 1956 DOA: 08/19/2021 PCP: [name], MD   Brief Narrative:  Pt with past medical history of congestive heart failure, chronic kidney disease 3B, tobacco abuse, B12 deficiency, hypertension presents with shortness of breath, worse with exertion, minimal swelling in legs but no orthopnea.   Assessment & Plan:   Acute hypoxic respiratory failure secondary to COPD exacerbation, POA Cannot rule out concurrent heart failure exacerbation(see below) POA.   -Admitted on 10 L high flow -continue weaning oxygen as tolerated, baseline patient is on room air -Continues to be hypoxic at rest as well as worse with exertion. -Continue steroid taper, empiric azithromycin -Sepsis ruled out.  Chronic combined systolic and diastolic congestive heart failure. -Echocardiogram showed a normal ejection fraction.   -Continue carvedilol, torsemide   Chronic disease stage IIIb.  -Follow repeat labs, creatinine stable  GERD on Protonix  Depression on Effexor  Hyperlipidemia on atorvastatin  Paroxysmal atrial fibrillation on Eliquis for anticoagulation and low-dose Coreg for rate control  DVT prophylaxis: Eliquis Code Status: Full Family Communication: At bedside  Status is: Inpatient  Dispo: The patient is from: Home              Anticipated d/c is to: Home              Anticipated d/c date is: 24 to 48 hours              Patient currently not medically stable for discharge  Consultants:  None  Procedures:  None  Antimicrobials:  Azithromycin  Subjective: No acute issues or events overnight, respiratory status continues to improve slowly but not yet back to baseline, remains hypoxic today with minimal exertion.  Objective: Vitals:   08/22/21 1948 08/22/21 2041 08/23/21 0226 08/23/21 0450  BP:  (!) 143/67  127/72  Pulse:  86  80  Resp:  18  18  Temp:  (!) 97.5 F (36.4 C)  98.1 F (36.7 C)  TempSrc:      SpO2: 95%  98% 99% 100%  Weight:      Height:       No intake or output data in the 24 hours ending 08/23/21 0726  Filed Weights   08/19/21 1752  Weight: 54.9 kg    Examination:  General:  Pleasantly resting in bed, No acute distress. HEENT:  Normocephalic atraumatic.  Sclerae nonicteric, noninjected.  Extraocular movements intact bilaterally. Neck:  Without mass or deformity.  Trachea is midline. Lungs: Profoundly diminished breath sounds bilaterally without overt rales. Heart:  Regular rate and rhythm.  Without murmurs, rubs, or gallops. Abdomen:  Soft, nontender, nondistended.  Without guarding or rebound. Extremities: Without cyanosis, clubbing, edema, or obvious deformity. Vascular:  Dorsalis pedis and posterior tibial pulses palpable bilaterally. Skin:  Warm and dry, no erythema, no ulcerations.   Data Reviewed: I have personally reviewed following labs and imaging studies  CBC: Recent Labs  Lab 08/19/21 1803 08/20/21 0634 08/22/21 0440 08/23/21 0424  WBC 13.1* 6.0 13.6* 12.3*  NEUTROABS 9.3*  --   --   --   HGB 13.4 12.1 12.6 12.6  HCT 40.8 36.9 38.1 38.2  MCV 89.1 87.2 87.4 86.0  PLT 210 176 193 458    Basic Metabolic Panel: Recent Labs  Lab 08/19/21 1803 08/20/21 0634 08/22/21 0440 08/23/21 0424  NA 138 138 136 135  K 4.4 4.2 4.6 4.7  CL 105 105 101 101  CO2 25 25 25 26   GLUCOSE  86 148* 112* 103*  BUN 23 24* 50* 56*  CREATININE 1.63* 1.58* 1.74* 1.69*  CALCIUM 9.0 8.8* 9.3 8.9  MG  --   --  2.0  --     GFR: Estimated Creatinine Clearance: 28.4 mL/min (A) (by C-G formula based on SCr of 1.69 mg/dL (H)). Liver Function Tests: Recent Labs  Lab 08/19/21 1803 08/20/21 0634  AST 16 13*  ALT 13 11  ALKPHOS 74 71  BILITOT 0.6 0.5  PROT 7.6 6.8  ALBUMIN 4.1 3.7    No results for input(s): LIPASE, AMYLASE in the last 168 hours. No results for input(s): AMMONIA in the last 168 hours. Coagulation Profile: No results for input(s): INR, PROTIME in the last  168 hours. Cardiac Enzymes: No results for input(s): CKTOTAL, CKMB, CKMBINDEX, TROPONINI in the last 168 hours. BNP (last 3 results) No results for input(s): PROBNP in the last 8760 hours. HbA1C: No results for input(s): HGBA1C in the last 72 hours. CBG: Recent Labs  Lab 08/21/21 2002 08/22/21 0759 08/22/21 1211 08/22/21 1228 08/22/21 1627  GLUCAP 168* 91 65* 95 161*    Lipid Profile: No results for input(s): CHOL, HDL, LDLCALC, TRIG, CHOLHDL, LDLDIRECT in the last 72 hours. Thyroid Function Tests: No results for input(s): TSH, T4TOTAL, FREET4, T3FREE, THYROIDAB in the last 72 hours. Anemia Panel: No results for input(s): VITAMINB12, FOLATE, FERRITIN, TIBC, IRON, RETICCTPCT in the last 72 hours. Sepsis Labs: No results for input(s): PROCALCITON, LATICACIDVEN in the last 168 hours.  Recent Results (from the past 240 hour(s))  Resp Panel by RT-PCR (Flu A&B, Covid) Nasopharyngeal Swab     Status: None   Collection Time: 08/19/21  6:03 PM   Specimen: Nasopharyngeal Swab; Nasopharyngeal(NP) swabs in vial transport medium  Result Value Ref Range Status   SARS Coronavirus 2 by RT PCR NEGATIVE NEGATIVE Final    Comment: (NOTE) SARS-CoV-2 target nucleic acids are NOT DETECTED.  The SARS-CoV-2 RNA is generally detectable in upper respiratory specimens during the acute phase of infection. The lowest concentration of SARS-CoV-2 viral copies this assay can detect is 138 copies/mL. A negative result does not preclude SARS-Cov-2 infection and should not be used as the sole basis for treatment or other patient management decisions. A negative result may occur with  improper specimen collection/handling, submission of specimen other than nasopharyngeal swab, presence of viral mutation(s) within the areas targeted by this assay, and inadequate number of viral copies(<138 copies/mL). A negative result must be combined with clinical observations, patient history, and  epidemiological information. The expected result is Negative.  Fact Sheet for Patients:  EntrepreneurPulse.com.au  Fact Sheet for Healthcare Providers:  IncredibleEmployment.be  This test is no t yet approved or cleared by the [place] FDA and  has been authorized for detection and/or diagnosis of SARS-CoV-2 by FDA under an Emergency Use Authorization (EUA). This EUA will remain  in effect (meaning this test can be used) for the duration of the COVID-19 declaration under Section 564(b)(1) of the Act, 21 U.S.C.section 360bbb-3(b)(1), unless the authorization is terminated  or revoked sooner.       Influenza A by PCR NEGATIVE NEGATIVE Final   Influenza B by PCR NEGATIVE NEGATIVE Final    Comment: (NOTE) The Xpert Xpress SARS-CoV-2/FLU/RSV plus assay is intended as an aid in the diagnosis of influenza from Nasopharyngeal swab specimens and should not be used as a sole basis for treatment. Nasal washings and aspirates are unacceptable for Xpert Xpress SARS-CoV-2/FLU/RSV testing.  Fact Sheet for Patients: EntrepreneurPulse.com.au  Fact Sheet for Healthcare Providers: IncredibleEmployment.be  This test is not yet approved or cleared by the [place] FDA and has been authorized for detection and/or diagnosis of SARS-CoV-2 by FDA under an Emergency Use Authorization (EUA). This EUA will remain in effect (meaning this test can be used) for the duration of the COVID-19 declaration under Section 564(b)(1) of the Act, 21 U.S.C. section 360bbb-3(b)(1), unless the authorization is terminated or revoked.  Performed at [facility], [address] [place], [place] [address]           Radiology Studies: No results found.      Scheduled Meds:  amLODipine  10 mg Oral Daily   apixaban  2.5 mg Oral BID   atorvastatin  40 mg Oral Daily   azithromycin  500 mg Oral Daily   buPROPion  150  mg Oral Daily   carvedilol  25 mg Oral BID   feeding supplement (GLUCERNA SHAKE)  237 mL Oral TID BM   fluticasone furoate-vilanterol  1 puff Inhalation Daily   insulin aspart  0-9 Units Subcutaneous TID WC   ipratropium-albuterol  3 mL Nebulization Q6H   methylPREDNISolone (SOLU-MEDROL) injection  60 mg Intravenous Daily   [START ON 08/24/2021] methylPREDNISolone (SOLU-MEDROL) injection  40 mg Intravenous Daily   multivitamin with minerals  1 tablet Oral Daily   nicotine  14 mg Transdermal Daily   pantoprazole  40 mg Oral Daily   sodium chloride flush  3 mL Intravenous Q12H   sodium chloride flush  3 mL Intravenous Q12H   tiotropium  18 mcg Inhalation Daily   torsemide  20 mg Oral Daily   venlafaxine XR  150 mg Oral Daily   Continuous Infusions:  sodium chloride       LOS: 4 days   Time spent: 51min  Cinthia Rodden C Aadan Chenier, DO [facility]  If 7PM-7AM, please contact night-coverage [email or web address]  08/23/2021, 7:26 AM

## 2021-08-24 DIAGNOSIS — R0602 Shortness of breath: Secondary | ICD-10-CM | POA: Diagnosis not present

## 2021-08-24 LAB — GLUCOSE, CAPILLARY
Glucose-Capillary: 246 mg/dL — ABNORMAL HIGH (ref 70–99)
Glucose-Capillary: 127 mg/dL — ABNORMAL HIGH (ref 70–99)
Glucose-Capillary: 143 mg/dL — ABNORMAL HIGH (ref 70–99)
Glucose-Capillary: 208 mg/dL — ABNORMAL HIGH (ref 70–99)

## 2021-08-24 NOTE — Progress Notes (Signed)
PROGRESS NOTE    Dawn Sherman  ZCH:[id number] DOB: [birth year] DOA: 08/19/2021 PCP: [name], MD   Brief Narrative:  Pt with past medical history of congestive heart failure, chronic kidney disease 3B, tobacco abuse, B12 deficiency, hypertension presents with shortness of breath, worse with exertion, minimal swelling in legs but no orthopnea.  Assessment & Plan:   Acute hypoxic respiratory failure secondary to COPD exacerbation, POA Cannot rule out concurrent heart failure exacerbation(see below) POA.   -Admitted on 10 L high flow -continue weaning oxygen as tolerated -Baseline patient is on room air -Continues to be hypoxic at rest today - worse with exertion -Continue steroid taper, empiric azithromycin -Sepsis ruled out.  Chronic combined systolic and diastolic congestive heart failure. -Echocardiogram showed a normal ejection fraction.   -Continue carvedilol, torsemide   Chronic disease stage IIIb.  -Follow repeat labs, creatinine stable  GERD on Protonix  Depression on Effexor  Hyperlipidemia on atorvastatin  Paroxysmal atrial fibrillation on Eliquis for anticoagulation and low-dose Coreg for rate control  DVT prophylaxis: Eliquis Code Status: Full Family Communication: At bedside  Status is: Inpatient  Dispo: The patient is from: Home              Anticipated d/c is to: Home              Anticipated d/c date is: 24 to 48 hours              Patient currently not medically stable for discharge  Consultants:  None  Procedures:  None  Antimicrobials:  Azithromycin  Subjective: No acute issues or events overnight, respiratory status continues to improve slowly but not yet back to baseline, remains hypoxic today at rest but is not in any distress.  Objective: Vitals:   08/23/21 1536 08/23/21 2009 08/23/21 2033 08/24/21 0444  BP: (!) 125/55 132/67  (!) 141/88  Pulse: 83 87  89  Resp: 20 18  18   Temp: 98.4 F (36.9 C) 98 F (36.7 C)  98.1 F  (36.7 C)  TempSrc:  Oral  Oral  SpO2: 94% 96% 95% 99%  Weight:      Height:        Intake/Output Summary (Last 24 hours) at 08/24/2021 0653 Last data filed at 08/23/2021 1847 Gross per 24 hour  Intake 720 ml  Output --  Net 720 ml    Filed Weights   08/19/21 1752  Weight: 54.9 kg    Examination:  General:  Pleasantly resting in bed, No acute distress. HEENT:  Normocephalic atraumatic.  Sclerae nonicteric, noninjected.  Extraocular movements intact bilaterally. Neck:  Without mass or deformity.  Trachea is midline. Lungs: Moderately diminished breath sounds bilaterally without overt wheeze, rales, or rhonchi. Heart:  Regular rate and rhythm.  Without murmurs, rubs, or gallops. Abdomen:  Soft, nontender, nondistended.  Without guarding or rebound. Extremities: Without cyanosis, clubbing, edema, or obvious deformity. Vascular:  Dorsalis pedis and posterior tibial pulses palpable bilaterally. Skin:  Warm and dry, no erythema, no ulcerations.   Data Reviewed: I have personally reviewed following labs and imaging studies  CBC: Recent Labs  Lab 08/19/21 1803 08/20/21 0634 08/22/21 0440 08/23/21 0424  WBC 13.1* 6.0 13.6* 12.3*  NEUTROABS 9.3*  --   --   --   HGB 13.4 12.1 12.6 12.6  HCT 40.8 36.9 38.1 38.2  MCV 89.1 87.2 87.4 86.0  PLT 210 176 193 287    Basic Metabolic Panel: Recent Labs  Lab 08/19/21 1803 08/20/21 0634 08/22/21  0440 08/23/21 0424  NA 138 138 136 135  K 4.4 4.2 4.6 4.7  CL 105 105 101 101  CO2 25 25 25 26   GLUCOSE 86 148* 112* 103*  BUN 23 24* 50* 56*  CREATININE 1.63* 1.58* 1.74* 1.69*  CALCIUM 9.0 8.8* 9.3 8.9  MG  --   --  2.0  --     GFR: Estimated Creatinine Clearance: 28.4 mL/min (A) (by C-G formula based on SCr of 1.69 mg/dL (H)). Liver Function Tests: Recent Labs  Lab 08/19/21 1803 08/20/21 0634  AST 16 13*  ALT 13 11  ALKPHOS 74 71  BILITOT 0.6 0.5  PROT 7.6 6.8  ALBUMIN 4.1 3.7    No results for input(s): LIPASE,  AMYLASE in the last 168 hours. No results for input(s): AMMONIA in the last 168 hours. Coagulation Profile: No results for input(s): INR, PROTIME in the last 168 hours. Cardiac Enzymes: No results for input(s): CKTOTAL, CKMB, CKMBINDEX, TROPONINI in the last 168 hours. BNP (last 3 results) No results for input(s): PROBNP in the last 8760 hours. HbA1C: No results for input(s): HGBA1C in the last 72 hours. CBG: Recent Labs  Lab 08/22/21 1627 08/23/21 0748 08/23/21 1124 08/23/21 1537 08/23/21 2008  GLUCAP 161* 77 93 198* 196*    Lipid Profile: No results for input(s): CHOL, HDL, LDLCALC, TRIG, CHOLHDL, LDLDIRECT in the last 72 hours. Thyroid Function Tests: No results for input(s): TSH, T4TOTAL, FREET4, T3FREE, THYROIDAB in the last 72 hours. Anemia Panel: No results for input(s): VITAMINB12, FOLATE, FERRITIN, TIBC, IRON, RETICCTPCT in the last 72 hours. Sepsis Labs: No results for input(s): PROCALCITON, LATICACIDVEN in the last 168 hours.  Recent Results (from the past 240 hour(s))  Resp Panel by RT-PCR (Flu A&B, Covid) Nasopharyngeal Swab     Status: None   Collection Time: 08/19/21  6:03 PM   Specimen: Nasopharyngeal Swab; Nasopharyngeal(NP) swabs in vial transport medium  Result Value Ref Range Status   SARS Coronavirus 2 by RT PCR NEGATIVE NEGATIVE Final    Comment: (NOTE) SARS-CoV-2 target nucleic acids are NOT DETECTED.  The SARS-CoV-2 RNA is generally detectable in upper respiratory specimens during the acute phase of infection. The lowest concentration of SARS-CoV-2 viral copies this assay can detect is 138 copies/mL. A negative result does not preclude SARS-Cov-2 infection and should not be used as the sole basis for treatment or other patient management decisions. A negative result may occur with  improper specimen collection/handling, submission of specimen other than nasopharyngeal swab, presence of viral mutation(s) within the areas targeted by this assay,  and inadequate number of viral copies(<138 copies/mL). A negative result must be combined with clinical observations, patient history, and epidemiological information. The expected result is Negative.  Fact Sheet for Patients:  EntrepreneurPulse.com.au  Fact Sheet for Healthcare Providers:  IncredibleEmployment.be  This test is no t yet approved or cleared by the [place] FDA and  has been authorized for detection and/or diagnosis of SARS-CoV-2 by FDA under an Emergency Use Authorization (EUA). This EUA will remain  in effect (meaning this test can be used) for the duration of the COVID-19 declaration under Section 564(b)(1) of the Act, 21 U.S.C.section 360bbb-3(b)(1), unless the authorization is terminated  or revoked sooner.       Influenza A by PCR NEGATIVE NEGATIVE Final   Influenza B by PCR NEGATIVE NEGATIVE Final    Comment: (NOTE) The Xpert Xpress SARS-CoV-2/FLU/RSV plus assay is intended as an aid in the diagnosis of influenza from Nasopharyngeal swab  specimens and should not be used as a sole basis for treatment. Nasal washings and aspirates are unacceptable for Xpert Xpress SARS-CoV-2/FLU/RSV testing.  Fact Sheet for Patients: EntrepreneurPulse.com.au  Fact Sheet for Healthcare Providers: IncredibleEmployment.be  This test is not yet approved or cleared by the [place] FDA and has been authorized for detection and/or diagnosis of SARS-CoV-2 by FDA under an Emergency Use Authorization (EUA). This EUA will remain in effect (meaning this test can be used) for the duration of the COVID-19 declaration under Section 564(b)(1) of the Act, 21 U.S.C. section 360bbb-3(b)(1), unless the authorization is terminated or revoked.  Performed at [facility], [address] [place], [place] [address]           Radiology Studies: No results found.      Scheduled Meds:   amLODipine  10 mg Oral Daily   apixaban  2.5 mg Oral BID   atorvastatin  40 mg Oral Daily   buPROPion  150 mg Oral Daily   carvedilol  25 mg Oral BID   feeding supplement (GLUCERNA SHAKE)  237 mL Oral TID BM   fluticasone furoate-vilanterol  1 puff Inhalation Daily   insulin aspart  0-9 Units Subcutaneous TID WC   ipratropium-albuterol  3 mL Nebulization TID   methylPREDNISolone (SOLU-MEDROL) injection  40 mg Intravenous Daily   multivitamin with minerals  1 tablet Oral Daily   nicotine  14 mg Transdermal Daily   pantoprazole  40 mg Oral Daily   sodium chloride flush  3 mL Intravenous Q12H   sodium chloride flush  3 mL Intravenous Q12H   tiotropium  18 mcg Inhalation Daily   torsemide  20 mg Oral Daily   venlafaxine XR  150 mg Oral Daily   Continuous Infusions:  sodium chloride       LOS: 5 days   Time spent: 38min  Karington Zarazua C Paxtyn Boyar, DO [facility]  If 7PM-7AM, please contact night-coverage [email or web address]  08/24/2021, 6:53 AM

## 2021-08-24 NOTE — Progress Notes (Signed)
Nutrition Follow-up  DOCUMENTATION CODES:   Non-severe (moderate) malnutrition in context of chronic illness  INTERVENTION:   -D/c Glucerna shake due to poor acceptance -Magic cup TID with meals, each supplement provides 290 kcal and 9 grams of protein  -MVI with minerals daily  NUTRITION DIAGNOSIS:   Moderate Malnutrition related to chronic illness (COPD) as evidenced by mild fat depletion, moderate fat depletion, mild muscle depletion, moderate muscle depletion.  Ongoing  GOAL:   Patient will meet greater than or equal to 90% of their needs  Progressing   MONITOR:   PO intake, Supplement acceptance, Labs, Weight trends, Skin, I & O's  REASON FOR ASSESSMENT:   Consult Assessment of nutrition requirement/status  ASSESSMENT:   Dawn Sherman is a 65 y.o. female seen in ed with complaints of SOB. Pt has past medical history of congestive heart failure, chronic kidney disease, tobacco abuse, B12 deficiency, hypertension  Reviewed I/O's: +720 ml x 24 hours and +1.9 L since admission   Spoke with pt at bedside, who reports good appetite. Pt consumed 100% of breakfast this AM. Pt reports liking the hospital food and has been able to find menu items she enjoys. She reports that she consumes 3 meaps per day PTA (Breakfast: eggs with cheese and grits, Lunch: wrap, Dinner: meat, starch, and vegetable).   Per pt, she has lost 20 pounds over the past year, which she related to diet changes (reducing sodium in her diet). Per pt, her cardiologist is pleased with her weight loss. Reviewed wt hx; wt has been stable over the past 6 months.   Discussed with pt importance of getting adequate protein and calories in diet. Voiced concern over fat and muscle depletions found. Discussed ways to increase protein in diet. Pt refusing oral nutrition supplements.   Medications reviewed and include solu-medrol.   Labs reviewed: CBGS: 65-246 (inpatient orders for glycemic control are 0-9 units  insulin aspart TID with meals).    NUTRITION - FOCUSED PHYSICAL EXAM:  Flowsheet Row Most Recent Value  Orbital Region Severe depletion  Upper Arm Region Moderate depletion  Thoracic and Lumbar Region Moderate depletion  Buccal Region Moderate depletion  Temple Region Moderate depletion  Clavicle Bone Region Moderate depletion  Clavicle and Acromion Bone Region Moderate depletion  Scapular Bone Region Moderate depletion  Dorsal Hand Moderate depletion  Patellar Region Moderate depletion  Anterior Thigh Region Moderate depletion  Posterior Calf Region Moderate depletion  Edema (RD Assessment) None  Hair Reviewed  Eyes Reviewed  Mouth Reviewed  Skin Reviewed  Nails Reviewed       Diet Order:   Diet Order             Diet heart healthy/carb modified Room service appropriate? Yes; Fluid consistency: Thin  Diet effective now                   EDUCATION NEEDS:   Education needs have been addressed  Skin:  Skin Assessment: Reviewed RN Assessment  Last BM:  08/24/21  Height:   Ht Readings from Last 1 Encounters:  08/19/21 5\' 5"  (1.651 m)    Weight:   Wt Readings from Last 1 Encounters:  08/19/21 54.9 kg    Ideal Body Weight:  56.8 kg  BMI:  Body mass index is 20.14 kg/m.  Estimated Nutritional Needs:   Kcal:  1650-1850  Protein:  75-90 grams  Fluid:  > 1.6 L    Loistine Chance, RD, LDN, [place] Registered Dietitian II Certified Diabetes Care  and Education Specialist Please refer to [facility] for RD and/or RD on-call/weekend/after hours pager

## 2021-08-24 NOTE — Progress Notes (Signed)
SATURATION QUALIFICATIONS: (This note is used to comply with regulatory documentation for home oxygen)  Patient Saturations on Room Air at Rest = 84%  Patient Saturations on Room Air while Ambulating = 84%  Patient Saturations on 2 Liters of oxygen while Ambulating = 94%  Please briefly explain why patient needs home oxygen:

## 2021-08-25 DIAGNOSIS — R0602 Shortness of breath: Secondary | ICD-10-CM | POA: Diagnosis not present

## 2021-08-25 DIAGNOSIS — E440 Moderate protein-calorie malnutrition: Secondary | ICD-10-CM | POA: Insufficient documentation

## 2021-08-25 LAB — BASIC METABOLIC PANEL
Anion gap: 8 (ref 5–15)
BUN: 60 mg/dL — ABNORMAL HIGH (ref 8–23)
CO2: 29 mmol/L (ref 22–32)
Calcium: 8.9 mg/dL (ref 8.9–10.3)
Chloride: 99 mmol/L (ref 98–111)
Creatinine, Ser: 1.96 mg/dL — ABNORMAL HIGH (ref 0.44–1.00)
GFR, Estimated: 28 mL/min — ABNORMAL LOW (ref >60)
Glucose, Bld: 80 mg/dL (ref 70–99)
Potassium: 4.8 mmol/L (ref 3.5–5.1)
Sodium: 136 mmol/L (ref 135–145)

## 2021-08-25 LAB — CBC
HCT: 37.9 % (ref 36.0–46.0)
Hemoglobin: 12.6 g/dL (ref 12.0–15.0)
MCH: 28.9 pg (ref 26.0–34.0)
MCHC: 33.2 g/dL (ref 30.0–36.0)
MCV: 86.9 fL (ref 80.0–100.0)
Platelets: 230 10*3/uL (ref 150–400)
RBC: 4.36 MIL/uL (ref 3.87–5.11)
RDW: 14.5 % (ref 11.5–15.5)
WBC: 11.5 10*3/uL — ABNORMAL HIGH (ref 4.0–10.5)
nRBC: 0 % (ref 0.0–0.2)

## 2021-08-25 LAB — GLUCOSE, CAPILLARY
Glucose-Capillary: 83 mg/dL (ref 70–99)
Glucose-Capillary: 115 mg/dL — ABNORMAL HIGH (ref 70–99)
Glucose-Capillary: 175 mg/dL — ABNORMAL HIGH (ref 70–99)
Glucose-Capillary: 170 mg/dL — ABNORMAL HIGH (ref 70–99)

## 2021-08-25 MED ORDER — DIPHENHYDRAMINE HCL 25 MG PO CAPS
25.0000 mg | ORAL_CAPSULE | Freq: Every evening | ORAL | Status: DC | PRN
  Administered 2021-08-25: 25 mg via ORAL
  Filled 2021-08-25: qty 1

## 2021-08-25 MED ORDER — DIPHENHYDRAMINE HCL 25 MG PO CAPS: 25.0000 mg | ORAL_CAPSULE | Freq: Four times a day (QID) | ORAL | Status: DC | PRN

## 2021-08-25 NOTE — Discharge Summary (Signed)
Physician Discharge Summary  Dawn Sherman ZDG:[id number] DOB: 10-15-1955 DOA: 08/19/2021  PCP: [name], MD  Admit date: 08/19/2021 Discharge date: 08/26/2021  Admitted From: Home Disposition: Home  Recommendations for Outpatient Follow-up:  Follow up with PCP in 1-2 weeks Please obtain BMP/CBC in one week Please follow up with pulmonology as discussed  Home Health: None Equipment/Devices: Nebulizer, oxygen 2 L nasal cannula  Discharge Condition: Stable CODE STATUS: Full Diet recommendation: Low-salt low-fat diet  Brief/Interim Summary: Pt with past medical history of congestive heart failure, chronic kidney disease 3B, ongoing tobacco abuse, B12 deficiency, hypertension presents with shortness of breath, worse with exertion, minimal swelling in legs but no orthopnea.  Found to have profound COPD exacerbation likely concurrent heart failure, both improving drastically.  Patient continues to be profoundly hypoxic, likely approaching baseline, will discharge on oxygen as below given maximize therapy with stabilized hypoxia.  Recommending close follow-up with PCP and pulmonology in the next few weeks, continue new inhaled medications including Trelegy, nebulizers every 4 hours at home as well as steroid taper.  Lengthy discussion daily about smoking cessation given her profound hypoxia she would likely only continue to worsen should she continue to smoke, she understands the risk factors included with ongoing tobacco abuse.   Acute hypoxic respiratory failure secondary to COPD exacerbation, POA Cannot rule out concurrent heart failure exacerbation(see below) POA.   -Admitted on 10 L high flow -unfortunately unable to wean patient back to her baseline of room air, discharged on 2 L nasal cannula around-the-clock, close follow-up with PCP and pulmonology in the next few weeks   Chronic combined systolic and diastolic congestive heart failure.  -Echocardiogram showed a normal ejection  fraction.   -Continue carvedilol, furosemide   Chronic disease stage IIIb.  -At baseline   GERD on Protonix   Depression on Effexor   Hyperlipidemia on atorvastatin   Paroxysmal atrial fibrillation on Eliquis for anticoagulation and low-dose Coreg for rate control   Discharge Instructions  Discharge Instructions     For home use only DME Nebulizer machine   Complete by: As directed    Patient needs a nebulizer to treat with the following condition: COPD (chronic obstructive pulmonary disease) ([place])   Length of Need: Lifetime   For home use only DME oxygen   Complete by: As directed    Length of Need: 6 Months   Mode or (Route): Nasal cannula   Liters per Minute: 2   Frequency: Continuous (stationary and portable oxygen unit needed)   Oxygen delivery system: Gas      Allergies as of 08/26/2021       Reactions   Amoxicillin    Benicar [olmesartan]    Talked with patient February 10, 2020, intolerance is unclear, tried several medications around that time and one of them gave her a rash but she is not clear which 1.   Codeine Sulfate    Morphine And Related    Penicillins    Sulfate    Clindamycin/lincomycin Rash        Medication List     STOP taking these medications    patiromer 8.4 g packet Commonly known as: VELTASSA       TAKE these medications    acetaminophen 325 MG tablet Commonly known as: TYLENOL Take 650 mg by mouth every 6 (six) hours as needed.   amLODipine 10 MG tablet Commonly known as: NORVASC TAKE 1 TABLET BY MOUTH EVERY DAY   atorvastatin 40 MG tablet Commonly known as:  LIPITOR TAKE 1 TABLET BY MOUTH EVERY DAY   buPROPion 150 MG 24 hr tablet Commonly known as: WELLBUTRIN XL TAKE 1 TABLET BY MOUTH EVERY DAY   carvedilol 25 MG tablet Commonly known as: COREG TAKE 1 TABLET BY MOUTH TWICE A DAY   Eliquis 2.5 MG Tabs tablet Generic drug: apixaban TAKE 1 TABLET BY MOUTH TWICE A DAY   furosemide 40 MG tablet Commonly known as:  LASIX Take 1 tablet (40 mg total) by mouth daily. Start taking on: August 27, 2021   ipratropium-albuterol 0.5-2.5 (3) MG/3ML Soln Commonly known as: DUONEB Take 3 mLs by nebulization every 4 (four) hours as needed (Shortness of breath/wheeze).   loratadine 10 MG tablet Commonly known as: CLARITIN Take 10 mg by mouth daily.   losartan 25 MG tablet Commonly known as: COZAAR Take 25 mg by mouth daily.   omeprazole 20 MG capsule Commonly known as: PRILOSEC Take 20 mg by mouth daily.   predniSONE 10 MG tablet Commonly known as: DELTASONE Take 4 tablets (40 mg total) by mouth daily for 3 days, THEN 3 tablets (30 mg total) daily for 3 days, THEN 2 tablets (20 mg total) daily for 3 days, THEN 1 tablet (10 mg total) daily for 3 days. Start taking on: August 26, 2021   Trelegy Ellipta 100-62.5-25 MCG/ACT Aepb Generic drug: Fluticasone-Umeclidin-Vilant Inhale 1 puff into the lungs daily.   venlafaxine XR 150 MG 24 hr capsule Commonly known as: EFFEXOR-XR TAKE 1 CAPSULE BY MOUTH EVERY DAY               Durable Medical Equipment  (From admission, onward)           Start     Ordered   08/26/21 0000  For home use only DME Nebulizer machine       Question Answer Comment  Patient needs a nebulizer to treat with the following condition COPD (chronic obstructive pulmonary disease) ([place])   Length of Need Lifetime      08/26/21 1525   08/26/21 0000  For home use only DME oxygen       Question Answer Comment  Length of Need 6 Months   Mode or (Route) Nasal cannula   Liters per Minute 2   Frequency Continuous (stationary and portable oxygen unit needed)   Oxygen delivery system Gas      08/26/21 1525            Allergies  Allergen Reactions   Amoxicillin    Benicar [Olmesartan]     Talked with patient February 10, 2020, intolerance is unclear, tried several medications around that time and one of them gave her a rash but she is not clear which 1.   Codeine Sulfate     Morphine And Related    Penicillins    Sulfate    Clindamycin/Lincomycin Rash    Consultations: None  Procedures/Studies: DG Chest Portable 1 View  Result Date: 08/19/2021 CLINICAL DATA:  Shortness of breath. EXAM: PORTABLE CHEST 1 VIEW COMPARISON:  06/15/2020 FINDINGS: 1823 hours. Lungs are hyperexpanded. The lungs are clear without focal pneumonia, edema, pneumothorax or pleural effusion. The cardiopericardial silhouette is within normal limits for size. Multiple nonacute bilateral rib fractures evident. Telemetry leads overlie the chest. IMPRESSION: Hyperexpansion without acute cardiopulmonary findings. Electronically Signed   By: Misty Stanley M.D.   On: 08/19/2021 18:34   ECHOCARDIOGRAM COMPLETE  Result Date: 08/20/2021    ECHOCARDIOGRAM REPORT   Patient Name:   ANIJA BRICKNER Date  of Exam: 08/20/2021 Medical Rec #:  [id number]     Height:       65.0 in Accession #:    [id number]    Weight:       121.0 lb Date of Birth:  06/29/1956    BSA:          1.598 m Patient Age:    32 years      BP:           147/83 mmHg Patient Gender: F             HR:           102 bpm. Exam Location:  [facility] Procedure: 2D Echo, Color Doppler and Cardiac Doppler Indications:     I50.31 congestive heart failure-Acute Diastolic  History:         Patient has prior history of Echocardiogram examinations, most                  recent 08/09/2020. CHF, CKD; Risk Factors:Current Smoker,                  Hypertension and Dyslipidemia.  Sonographer:     Charmayne Sheer Referring Phys:  [place] Diagnosing Phys: Kathlyn Sacramento MD  Sonographer Comments: No subcostal window. IMPRESSIONS  1. Left ventricular ejection fraction, by estimation, is 50 to 55%. The left ventricle has low normal function. The left ventricle has no regional wall motion abnormalities. The left ventricular internal cavity size was mildly dilated. Left ventricular diastolic parameters are indeterminate.  2. Right ventricular systolic function is  normal. The right ventricular size is normal. Tricuspid regurgitation signal is inadequate for assessing PA pressure.  3. The mitral valve is normal in structure. No evidence of mitral valve regurgitation. No evidence of mitral stenosis.  4. The aortic valve is normal in structure. Aortic valve regurgitation is not visualized. Aortic valve sclerosis is present, with no evidence of aortic valve stenosis. Comparison(s): A prior study was performed on 02/10/2020. Prior images reviewed side by side. Changes from prior study are noted. LV systolic function improved. FINDINGS  Left Ventricle: Left ventricular ejection fraction, by estimation, is 50 to 55%. The left ventricle has low normal function. The left ventricle has no regional wall motion abnormalities. The left ventricular internal cavity size was mildly dilated. There is no left ventricular hypertrophy. Left ventricular diastolic parameters are indeterminate. Right Ventricle: The right ventricular size is normal. No increase in right ventricular wall thickness. Right ventricular systolic function is normal. Tricuspid regurgitation signal is inadequate for assessing PA pressure. Left Atrium: Left atrial size was normal in size. Right Atrium: Right atrial size was normal in size. Pericardium: There is no evidence of pericardial effusion. Mitral Valve: The mitral valve is normal in structure. Mild mitral annular calcification. No evidence of mitral valve regurgitation. No evidence of mitral valve stenosis. MV peak gradient, 9.2 mmHg. The mean mitral valve gradient is 4.0 mmHg. Tricuspid Valve: The tricuspid valve is normal in structure. Tricuspid valve regurgitation is not demonstrated. No evidence of tricuspid stenosis. Aortic Valve: The aortic valve is normal in structure. Aortic valve regurgitation is not visualized. Aortic valve sclerosis is present, with no evidence of aortic valve stenosis. Aortic valve mean gradient measures 4.0 mmHg. Aortic valve peak  gradient measures 6.8 mmHg. Aortic valve area, by VTI measures 1.84 cm. Pulmonic Valve: The pulmonic valve was normal in structure. Pulmonic valve regurgitation is not visualized. No evidence of pulmonic stenosis. Aorta: The aortic root  is normal in size and structure. Venous: The inferior vena cava was not well visualized. IAS/Shunts: No atrial level shunt detected by color flow Doppler.  LEFT VENTRICLE PLAX 2D LVIDd:         5.16 cm     Diastology LVIDs:         3.58 cm     LV e' medial:    12.90 cm/s LV PW:         1.03 cm     LV E/e' medial:  9.6 LV IVS:        0.70 cm     LV e' lateral:   9.36 cm/s LVOT diam:     2.00 cm     LV E/e' lateral: 13.2 LV SV:         45 LV SV Index:   28 LVOT Area:     3.14 cm  LV Volumes (MOD) LV vol d, MOD A2C: 72.3 ml LV vol d, MOD A4C: 74.7 ml LV vol s, MOD A2C: 34.6 ml LV vol s, MOD A4C: 40.2 ml LV SV MOD A2C:     37.7 ml LV SV MOD A4C:     74.7 ml LV SV MOD BP:      37.1 ml RIGHT VENTRICLE RV Basal diam:  2.80 cm LEFT ATRIUM             Index        RIGHT ATRIUM           Index LA diam:        4.00 cm 2.50 cm/m   RA Area:     10.50 cm LA Vol (A2C):   42.4 ml 26.54 ml/m  RA Volume:   23.60 ml  14.77 ml/m LA Vol (A4C):   39.1 ml 24.47 ml/m LA Biplane Vol: 41.5 ml 25.97 ml/m  AORTIC VALVE                    PULMONIC VALVE AV Area (Vmax):    2.16 cm     PV Vmax:       0.99 m/s AV Area (Vmean):   1.99 cm     PV Vmean:      71.200 cm/s AV Area (VTI):     1.84 cm     PV VTI:        0.157 m AV Vmax:           130.00 cm/s  PV Peak grad:  3.9 mmHg AV Vmean:          94.400 cm/s  PV Mean grad:  2.0 mmHg AV VTI:            0.242 m AV Peak Grad:      6.8 mmHg AV Mean Grad:      4.0 mmHg LVOT Vmax:         89.40 cm/s LVOT Vmean:        59.900 cm/s LVOT VTI:          0.142 m LVOT/AV VTI ratio: 0.59  AORTA Ao Root diam: 3.50 cm MITRAL VALVE MV Area (PHT): 4.27 cm     SHUNTS MV Area VTI:   1.98 cm     Systemic VTI:  0.14 m MV Peak grad:  9.2 mmHg     Systemic Diam: 2.00 cm MV Mean  grad:  4.0 mmHg MV Vmax:       1.52 m/s MV Vmean:      86.7 cm/s MV Decel  Time: 178 msec MV E velocity: 123.50 cm/s Kathlyn Sacramento MD Electronically signed by Kathlyn Sacramento MD Signature Date/Time: 08/20/2021/10:56:16 AM    Final      Subjective: No acute issues or events overnight denies nausea vomiting diarrhea constipation headache fevers chills or chest pain, shortness of breath improving but not yet back to baseline   Discharge Exam: Vitals:   08/26/21 1230 08/26/21 1236  BP: (!) 80/53 94/79  Pulse:  80  Resp:  17  Temp:  98.1 F (36.7 C)  SpO2: 90% 98%   Vitals:   08/26/21 0627 08/26/21 0858 08/26/21 1230 08/26/21 1236  BP: 107/86 (!) 119/55 (!) 80/53 94/79  Pulse: 83 86  80  Resp: 16   17  Temp: 98.2 F (36.8 C) 97.6 F (36.4 C)  98.1 F (36.7 C)  TempSrc: Oral Axillary    SpO2: 98% 96% 90% 98%  Weight:      Height:        General:  Pleasantly resting in bed, No acute distress. HEENT:  Normocephalic atraumatic.  Sclerae nonicteric, noninjected.  Extraocular movements intact bilaterally. Neck:  Without mass or deformity.  Trachea is midline. Lungs: Moderately diminished bilaterally without rhonchi, wheeze, or rales. Heart:  Regular rate and rhythm.  Without murmurs, rubs, or gallops. Abdomen:  Soft, nontender, nondistended.  Without guarding or rebound. Extremities: Without cyanosis, clubbing, edema, or obvious deformity. Vascular:  Dorsalis pedis and posterior tibial pulses palpable bilaterally. Skin:  Warm and dry, no erythema, no ulcerations.   The results of significant diagnostics from this hospitalization (including imaging, microbiology, ancillary and laboratory) are listed below for reference.     Microbiology: Recent Results (from the past 240 hour(s))  Resp Panel by RT-PCR (Flu A&B, Covid) Nasopharyngeal Swab     Status: None   Collection Time: 08/19/21  6:03 PM   Specimen: Nasopharyngeal Swab; Nasopharyngeal(NP) swabs in vial transport medium  Result  Value Ref Range Status   SARS Coronavirus 2 by RT PCR NEGATIVE NEGATIVE Final    Comment: (NOTE) SARS-CoV-2 target nucleic acids are NOT DETECTED.  The SARS-CoV-2 RNA is generally detectable in upper respiratory specimens during the acute phase of infection. The lowest concentration of SARS-CoV-2 viral copies this assay can detect is 138 copies/mL. A negative result does not preclude SARS-Cov-2 infection and should not be used as the sole basis for treatment or other patient management decisions. A negative result may occur with  improper specimen collection/handling, submission of specimen other than nasopharyngeal swab, presence of viral mutation(s) within the areas targeted by this assay, and inadequate number of viral copies(<138 copies/mL). A negative result must be combined with clinical observations, patient history, and epidemiological information. The expected result is Negative.  Fact Sheet for Patients:  EntrepreneurPulse.com.au  Fact Sheet for Healthcare Providers:  IncredibleEmployment.be  This test is no t yet approved or cleared by the [place] FDA and  has been authorized for detection and/or diagnosis of SARS-CoV-2 by FDA under an Emergency Use Authorization (EUA). This EUA will remain  in effect (meaning this test can be used) for the duration of the COVID-19 declaration under Section 564(b)(1) of the Act, 21 U.S.C.section 360bbb-3(b)(1), unless the authorization is terminated  or revoked sooner.       Influenza A by PCR NEGATIVE NEGATIVE Final   Influenza B by PCR NEGATIVE NEGATIVE Final    Comment: (NOTE) The Xpert Xpress SARS-CoV-2/FLU/RSV plus assay is intended as an aid in the diagnosis of influenza from Nasopharyngeal swab specimens and  should not be used as a sole basis for treatment. Nasal washings and aspirates are unacceptable for Xpert Xpress SARS-CoV-2/FLU/RSV testing.  Fact Sheet for  Patients: EntrepreneurPulse.com.au  Fact Sheet for Healthcare Providers: IncredibleEmployment.be  This test is not yet approved or cleared by the [place] FDA and has been authorized for detection and/or diagnosis of SARS-CoV-2 by FDA under an Emergency Use Authorization (EUA). This EUA will remain in effect (meaning this test can be used) for the duration of the COVID-19 declaration under Section 564(b)(1) of the Act, 21 U.S.C. section 360bbb-3(b)(1), unless the authorization is terminated or revoked.  Performed at [facility], [address] [place], [place] [address]      Labs: BNP (last 3 results) Recent Labs    08/19/21 1803  BNP 591.6*   Basic Metabolic Panel: Recent Labs  Lab 08/19/21 1803 08/20/21 0634 08/22/21 0440 08/23/21 0424 08/25/21 0608  NA 138 138 136 135 136  K 4.4 4.2 4.6 4.7 4.8  CL 105 105 101 101 99  CO2 25 25 25 26 29   GLUCOSE 86 148* 112* 103* 80  BUN 23 24* 50* 56* 60*  CREATININE 1.63* 1.58* 1.74* 1.69* 1.96*  CALCIUM 9.0 8.8* 9.3 8.9 8.9  MG  --   --  2.0  --   --    Liver Function Tests: Recent Labs  Lab 08/19/21 1803 08/20/21 0634  AST 16 13*  ALT 13 11  ALKPHOS 74 71  BILITOT 0.6 0.5  PROT 7.6 6.8  ALBUMIN 4.1 3.7   No results for input(s): LIPASE, AMYLASE in the last 168 hours. No results for input(s): AMMONIA in the last 168 hours. CBC: Recent Labs  Lab 08/19/21 1803 08/20/21 0634 08/22/21 0440 08/23/21 0424 08/25/21 0608  WBC 13.1* 6.0 13.6* 12.3* 11.5*  NEUTROABS 9.3*  --   --   --   --   HGB 13.4 12.1 12.6 12.6 12.6  HCT 40.8 36.9 38.1 38.2 37.9  MCV 89.1 87.2 87.4 86.0 86.9  PLT 210 176 193 216 230   Cardiac Enzymes: No results for input(s): CKTOTAL, CKMB, CKMBINDEX, TROPONINI in the last 168 hours. BNP: Invalid input(s): POCBNP CBG: Recent Labs  Lab 08/25/21 1148 08/25/21 1536 08/25/21 2121 08/26/21 0937 08/26/21 1210  GLUCAP 115* 175* 170* 185*  105*   D-Dimer No results for input(s): DDIMER in the last 72 hours. Hgb A1c No results for input(s): HGBA1C in the last 72 hours. Lipid Profile No results for input(s): CHOL, HDL, LDLCALC, TRIG, CHOLHDL, LDLDIRECT in the last 72 hours. Thyroid function studies No results for input(s): TSH, T4TOTAL, T3FREE, THYROIDAB in the last 72 hours.  Invalid input(s): FREET3 Anemia work up No results for input(s): VITAMINB12, FOLATE, FERRITIN, TIBC, IRON, RETICCTPCT in the last 72 hours. Urinalysis    Component Value Date/Time   COLORURINE STRAW (A) 06/15/2020 1117   APPEARANCEUR CLEAR (A) 06/15/2020 1117   LABSPEC 1.004 (L) 06/15/2020 1117   PHURINE 6.0 06/15/2020 1117   GLUCOSEU NEGATIVE 06/15/2020 1117   GLUCOSEU NEGATIVE 01/22/2017 1052   HGBUR NEGATIVE 06/15/2020 1117   BILIRUBINUR NEGATIVE 06/15/2020 1117   KETONESUR 5 (A) 06/15/2020 1117   PROTEINUR 30 (A) 06/15/2020 1117   UROBILINOGEN 0.2 01/22/2017 1052   NITRITE NEGATIVE 06/15/2020 1117   LEUKOCYTESUR NEGATIVE 06/15/2020 1117   Sepsis Labs Invalid input(s): PROCALCITONIN,  WBC,  LACTICIDVEN Microbiology Recent Results (from the past 240 hour(s))  Resp Panel by RT-PCR (Flu A&B, Covid) Nasopharyngeal Swab     Status: None  Collection Time: 08/19/21  6:03 PM   Specimen: Nasopharyngeal Swab; Nasopharyngeal(NP) swabs in vial transport medium  Result Value Ref Range Status   SARS Coronavirus 2 by RT PCR NEGATIVE NEGATIVE Final    Comment: (NOTE) SARS-CoV-2 target nucleic acids are NOT DETECTED.  The SARS-CoV-2 RNA is generally detectable in upper respiratory specimens during the acute phase of infection. The lowest concentration of SARS-CoV-2 viral copies this assay can detect is 138 copies/mL. A negative result does not preclude SARS-Cov-2 infection and should not be used as the sole basis for treatment or other patient management decisions. A negative result may occur with  improper specimen collection/handling,  submission of specimen other than nasopharyngeal swab, presence of viral mutation(s) within the areas targeted by this assay, and inadequate number of viral copies(<138 copies/mL). A negative result must be combined with clinical observations, patient history, and epidemiological information. The expected result is Negative.  Fact Sheet for Patients:  EntrepreneurPulse.com.au  Fact Sheet for Healthcare Providers:  IncredibleEmployment.be  This test is no t yet approved or cleared by the [place] FDA and  has been authorized for detection and/or diagnosis of SARS-CoV-2 by FDA under an Emergency Use Authorization (EUA). This EUA will remain  in effect (meaning this test can be used) for the duration of the COVID-19 declaration under Section 564(b)(1) of the Act, 21 U.S.C.section 360bbb-3(b)(1), unless the authorization is terminated  or revoked sooner.       Influenza A by PCR NEGATIVE NEGATIVE Final   Influenza B by PCR NEGATIVE NEGATIVE Final    Comment: (NOTE) The Xpert Xpress SARS-CoV-2/FLU/RSV plus assay is intended as an aid in the diagnosis of influenza from Nasopharyngeal swab specimens and should not be used as a sole basis for treatment. Nasal washings and aspirates are unacceptable for Xpert Xpress SARS-CoV-2/FLU/RSV testing.  Fact Sheet for Patients: EntrepreneurPulse.com.au  Fact Sheet for Healthcare Providers: IncredibleEmployment.be  This test is not yet approved or cleared by the [place] FDA and has been authorized for detection and/or diagnosis of SARS-CoV-2 by FDA under an Emergency Use Authorization (EUA). This EUA will remain in effect (meaning this test can be used) for the duration of the COVID-19 declaration under Section 564(b)(1) of the Act, 21 U.S.C. section 360bbb-3(b)(1), unless the authorization is terminated or revoked.  Performed at [facility], [address], [place], [place] [address]      Time coordinating discharge: Over 30 minutes  SIGNED:   Little Ishikawa, DO [facility] 08/26/2021, 3:30 PM Pager   If 7PM-7AM, please contact night-coverage [email or web address]

## 2021-08-25 NOTE — Plan of Care (Signed)

## 2021-08-25 NOTE — Progress Notes (Signed)
SATURATION QUALIFICATIONS: (This note is used to comply with regulatory documentation for home oxygen)  Patient Saturations on Room Air at Rest = 89%  Patient Saturations on Room Air while Ambulating = 85%  Patient Saturations on 2 Liters of oxygen while Ambulating = 93%  Please briefly explain why patient needs home oxygen:

## 2021-08-25 NOTE — Progress Notes (Signed)
PROGRESS NOTE    Dawn KAMA  ION:[id number] DOB: [birth year] DOA: 08/19/2021 PCP: [name], MD   Brief Narrative:  Pt with past medical history of congestive heart failure, chronic kidney disease 3B, tobacco abuse, B12 deficiency, hypertension presents with shortness of breath, worse with exertion, minimal swelling in legs but no orthopnea.  Assessment & Plan:   Acute hypoxic respiratory failure secondary to COPD exacerbation, POA Cannot rule out concurrent heart failure exacerbation(see below) POA.   - Admitted on 10 L high flow -continue weaning oxygen as tolerated - Baseline patient is on room air - 85% at rest today on room air - Continue steroid taper, empiric azithromycin - Sepsis ruled out.  Chronic combined systolic and diastolic congestive heart failure.  - Echocardiogram showed a normal ejection fraction.   - Continue carvedilol, torsemide   Chronic disease stage IIIb.  - Follow repeat labs, creatinine stable  GERD on Protonix  Depression on Effexor  Hyperlipidemia on atorvastatin  Paroxysmal atrial fibrillation on Eliquis for anticoagulation and low-dose Coreg for rate control  DVT prophylaxis: Eliquis Code Status: Full Family Communication: At bedside  Status is: Inpatient  Dispo: The patient is from: Home              Anticipated d/c is to: Home              Anticipated d/c date is: 24 to 48 hours              Patient currently not medically stable for discharge  Consultants:  Dawn Sherman  Procedures:  Dawn Sherman  Antimicrobials:  Azithromycin  Subjective: No acute issues or events overnight, respiratory status continues to improve slowly but not yet back to baseline, remains hypoxic today at rest but is not in any distress.  Objective: Vitals:   08/24/21 1206 08/24/21 1532 08/24/21 2041 08/25/21 0422  BP: (!) 113/94 105/73 137/73 140/76  Pulse: 86 81 92 75  Resp: 20 20 16 18   Temp: 98.6 F (37 C) 98 F (36.7 C) 97.9 F (36.6 C) (!) 97.5 F  (36.4 C)  TempSrc: Oral Oral Oral Oral  SpO2: 92% 94% 96% 99%  Weight:      Height:       No intake or output data in the 24 hours ending 08/25/21 0718  Filed Weights   08/19/21 1752  Weight: 54.9 kg    Examination:  General:  Pleasantly resting in bed, No acute distress. HEENT:  Normocephalic atraumatic.  Sclerae nonicteric, noninjected.  Extraocular movements intact bilaterally. Neck:  Without mass or deformity.  Trachea is midline. Lungs: Moderately diminished breath sounds bilaterally without overt wheeze, rales, or rhonchi. Heart:  Regular rate and rhythm.  Without murmurs, rubs, or gallops. Abdomen:  Soft, nontender, nondistended.  Without guarding or rebound. Extremities: Without cyanosis, clubbing, edema, or obvious deformity. Vascular:  Dorsalis pedis and posterior tibial pulses palpable bilaterally. Skin:  Warm and dry, no erythema, no ulcerations.   Data Reviewed: I have personally reviewed following labs and imaging studies  CBC: Recent Labs  Lab 08/19/21 1803 08/20/21 0634 08/22/21 0440 08/23/21 0424 08/25/21 0608  WBC 13.1* 6.0 13.6* 12.3* 11.5*  NEUTROABS 9.3*  --   --   --   --   HGB 13.4 12.1 12.6 12.6 12.6  HCT 40.8 36.9 38.1 38.2 37.9  MCV 89.1 87.2 87.4 86.0 86.9  PLT 210 176 193 216 244    Basic Metabolic Panel: Recent Labs  Lab 08/19/21 1803 08/20/21 0634 08/22/21 0440 08/23/21  0424 08/25/21 0608  NA 138 138 136 135 136  K 4.4 4.2 4.6 4.7 4.8  CL 105 105 101 101 99  CO2 25 25 25 26 29   GLUCOSE 86 148* 112* 103* 80  BUN 23 24* 50* 56* 60*  CREATININE 1.63* 1.58* 1.74* 1.69* 1.96*  CALCIUM 9.0 8.8* 9.3 8.9 8.9  MG  --   --  2.0  --   --     GFR: Estimated Creatinine Clearance: 24.5 mL/min (A) (by C-G formula based on SCr of 1.96 mg/dL (H)). Liver Function Tests: Recent Labs  Lab 08/19/21 1803 08/20/21 0634  AST 16 13*  ALT 13 11  ALKPHOS 74 71  BILITOT 0.6 0.5  PROT 7.6 6.8  ALBUMIN 4.1 3.7    No results for input(s):  LIPASE, AMYLASE in the last 168 hours. No results for input(s): AMMONIA in the last 168 hours. Coagulation Profile: No results for input(s): INR, PROTIME in the last 168 hours. Cardiac Enzymes: No results for input(s): CKTOTAL, CKMB, CKMBINDEX, TROPONINI in the last 168 hours. BNP (last 3 results) No results for input(s): PROBNP in the last 8760 hours. HbA1C: No results for input(s): HGBA1C in the last 72 hours. CBG: Recent Labs  Lab 08/23/21 2008 08/24/21 1046 08/24/21 1616 08/24/21 1633 08/24/21 2046  GLUCAP 196* 246* 127* 143* 208*    Lipid Profile: No results for input(s): CHOL, HDL, LDLCALC, TRIG, CHOLHDL, LDLDIRECT in the last 72 hours. Thyroid Function Tests: No results for input(s): TSH, T4TOTAL, FREET4, T3FREE, THYROIDAB in the last 72 hours. Anemia Panel: No results for input(s): VITAMINB12, FOLATE, FERRITIN, TIBC, IRON, RETICCTPCT in the last 72 hours. Sepsis Labs: No results for input(s): PROCALCITON, LATICACIDVEN in the last 168 hours.  Recent Results (from the past 240 hour(s))  Resp Panel by RT-PCR (Flu A&B, Covid) Nasopharyngeal Swab     Status: Dawn Sherman   Collection Time: 08/19/21  6:03 PM   Specimen: Nasopharyngeal Swab; Nasopharyngeal(NP) swabs in vial transport medium  Result Value Ref Range Status   SARS Coronavirus 2 by RT PCR NEGATIVE NEGATIVE Final    Comment: (NOTE) SARS-CoV-2 target nucleic acids are NOT DETECTED.  The SARS-CoV-2 RNA is generally detectable in upper respiratory specimens during the acute phase of infection. The lowest concentration of SARS-CoV-2 viral copies this assay can detect is 138 copies/mL. A negative result does not preclude SARS-Cov-2 infection and should not be used as the sole basis for treatment or other patient management decisions. A negative result may occur with  improper specimen collection/handling, submission of specimen other than nasopharyngeal swab, presence of viral mutation(s) within the areas targeted by  this assay, and inadequate number of viral copies(<138 copies/mL). A negative result must be combined with clinical observations, patient history, and epidemiological information. The expected result is Negative.  Fact Sheet for Patients:  EntrepreneurPulse.com.au  Fact Sheet for Healthcare Providers:  IncredibleEmployment.be  This test is no t yet approved or cleared by the [place] FDA and  has been authorized for detection and/or diagnosis of SARS-CoV-2 by FDA under an Emergency Use Authorization (EUA). This EUA will remain  in effect (meaning this test can be used) for the duration of the COVID-19 declaration under Section 564(b)(1) of the Act, 21 U.S.C.section 360bbb-3(b)(1), unless the authorization is terminated  or revoked sooner.       Influenza A by PCR NEGATIVE NEGATIVE Final   Influenza B by PCR NEGATIVE NEGATIVE Final    Comment: (NOTE) The Xpert Xpress SARS-CoV-2/FLU/RSV plus assay is intended  as an aid in the diagnosis of influenza from Nasopharyngeal swab specimens and should not be used as a sole basis for treatment. Nasal washings and aspirates are unacceptable for Xpert Xpress SARS-CoV-2/FLU/RSV testing.  Fact Sheet for Patients: EntrepreneurPulse.com.au  Fact Sheet for Healthcare Providers: IncredibleEmployment.be  This test is not yet approved or cleared by the [place] FDA and has been authorized for detection and/or diagnosis of SARS-CoV-2 by FDA under an Emergency Use Authorization (EUA). This EUA will remain in effect (meaning this test can be used) for the duration of the COVID-19 declaration under Section 564(b)(1) of the Act, 21 U.S.C. section 360bbb-3(b)(1), unless the authorization is terminated or revoked.  Performed at [facility], [address], [place], [place] [address]           Radiology Studies: No results found.      Scheduled  Meds:  amLODipine  10 mg Oral Daily   apixaban  2.5 mg Oral BID   atorvastatin  40 mg Oral Daily   buPROPion  150 mg Oral Daily   carvedilol  25 mg Oral BID   fluticasone furoate-vilanterol  1 puff Inhalation Daily   insulin aspart  0-9 Units Subcutaneous TID WC   methylPREDNISolone (SOLU-MEDROL) injection  40 mg Intravenous Daily   multivitamin with minerals  1 tablet Oral Daily   nicotine  14 mg Transdermal Daily   pantoprazole  40 mg Oral Daily   sodium chloride flush  3 mL Intravenous Q12H   sodium chloride flush  3 mL Intravenous Q12H   tiotropium  18 mcg Inhalation Daily   torsemide  20 mg Oral Daily   venlafaxine XR  150 mg Oral Daily   Continuous Infusions:  sodium chloride       LOS: 6 days   Time spent: 49min  Breuna Loveall C Rahma Meller, DO [facility]  If 7PM-7AM, please contact night-coverage [email or web address]  08/25/2021, 7:18 AM

## 2021-08-26 DIAGNOSIS — R0602 Shortness of breath: Secondary | ICD-10-CM | POA: Diagnosis not present

## 2021-08-26 LAB — GLUCOSE, CAPILLARY
Glucose-Capillary: 185 mg/dL — ABNORMAL HIGH (ref 70–99)
Glucose-Capillary: 105 mg/dL — ABNORMAL HIGH (ref 70–99)
Glucose-Capillary: 152 mg/dL — ABNORMAL HIGH (ref 70–99)
Glucose-Capillary: 171 mg/dL — ABNORMAL HIGH (ref 70–99)

## 2021-08-26 MED ORDER — TRELEGY ELLIPTA 100-62.5-25 MCG/ACT IN AEPB: 1.0000 | INHALATION_SPRAY | Freq: Every day | RESPIRATORY_TRACT | 1 refills | Status: DC

## 2021-08-26 MED ORDER — IPRATROPIUM-ALBUTEROL 0.5-2.5 (3) MG/3ML IN SOLN: 3.0000 mL | RESPIRATORY_TRACT | 1 refills | Status: DC | PRN

## 2021-08-26 MED ORDER — IPRATROPIUM-ALBUTEROL 0.5-2.5 (3) MG/3ML IN SOLN: 3.0000 mL | RESPIRATORY_TRACT | Status: DC | PRN

## 2021-08-26 MED ORDER — PREDNISONE 10 MG PO TABS: ORAL_TABLET | ORAL | 0 refills | Status: AC

## 2021-08-26 MED ORDER — FUROSEMIDE 40 MG PO TABS: 40.0000 mg | ORAL_TABLET | Freq: Every day | ORAL | 0 refills | Status: DC

## 2021-08-26 NOTE — Plan of Care (Signed)
°  Problem: Education: Goal: Knowledge of General Education information will improve Description: Including pain rating scale, medication(s)/side effects and non-pharmacologic comfort measures 08/26/2021 0243 by Lydia Guiles, RN Outcome: Progressing 08/26/2021 0243 by Lydia Guiles, RN Outcome: Progressing   Problem: Health Behavior/Discharge Planning: Goal: Ability to manage health-related needs will improve 08/26/2021 0243 by Lydia Guiles, RN Outcome: Progressing 08/26/2021 0243 by Lydia Guiles, RN Outcome: Progressing   Problem: Clinical Measurements: Goal: Respiratory complications will improve 08/26/2021 0243 by Lydia Guiles, RN Outcome: Progressing 08/26/2021 0243 by Lydia Guiles, RN Outcome: Progressing   Problem: Clinical Measurements: Goal: Cardiovascular complication will be avoided 08/26/2021 0243 by Lydia Guiles, RN Outcome: Progressing 08/26/2021 0243 by Lydia Guiles, RN Outcome: Progressing   Problem: Activity: Goal: Risk for activity intolerance will decrease 08/26/2021 0243 by Lydia Guiles, RN Outcome: Progressing 08/26/2021 0243 by Lydia Guiles, RN Outcome: Progressing   Problem: Nutrition: Goal: Adequate nutrition will be maintained 08/26/2021 0243 by Lydia Guiles, RN Outcome: Progressing 08/26/2021 0243 by Lydia Guiles, RN Outcome: Progressing   Problem: Coping: Goal: Level of anxiety will decrease 08/26/2021 0243 by Lydia Guiles, RN Outcome: Progressing 08/26/2021 0243 by Lydia Guiles, RN Outcome: Progressing   Problem: Safety: Goal: Ability to remain free from injury will improve 08/26/2021 0243 by Lydia Guiles, RN Outcome: Progressing 08/26/2021 0243 by Lydia Guiles, RN Outcome: Progressing   Problem: Skin Integrity: Goal: Risk for impaired skin integrity will decrease 08/26/2021 0243 by Lydia Guiles, RN Outcome: Progressing 08/26/2021 0243 by Lydia Guiles, RN Outcome: Progressing

## 2021-08-26 NOTE — Plan of Care (Signed)
  Problem: Health Behavior/Discharge Planning: Goal: Ability to manage health-related needs will improve Outcome: Progressing   

## 2021-08-26 NOTE — Progress Notes (Signed)
SATURATION QUALIFICATIONS: (This note is used to comply with regulatory documentation for home oxygen)  Patient Saturations on Room Air at Rest = 89%  Patient Saturations on Room Air while Ambulating = 83%  Patient Saturations on 3 Liters of oxygen while Ambulating = 95%  Please briefly explain why patient needs home oxygen:      on 2 liters she was 91%

## 2021-08-27 DIAGNOSIS — R0602 Shortness of breath: Secondary | ICD-10-CM | POA: Diagnosis not present

## 2021-08-27 LAB — CBC
HCT: 36.8 % (ref 36.0–46.0)
Hemoglobin: 12.2 g/dL (ref 12.0–15.0)
MCH: 29 pg (ref 26.0–34.0)
MCHC: 33.2 g/dL (ref 30.0–36.0)
MCV: 87.4 fL (ref 80.0–100.0)
Platelets: 253 10*3/uL (ref 150–400)
RBC: 4.21 MIL/uL (ref 3.87–5.11)
RDW: 14.4 % (ref 11.5–15.5)
WBC: 13.4 10*3/uL — ABNORMAL HIGH (ref 4.0–10.5)
nRBC: 0 % (ref 0.0–0.2)

## 2021-08-27 LAB — BASIC METABOLIC PANEL
Anion gap: 7 (ref 5–15)
BUN: 68 mg/dL — ABNORMAL HIGH (ref 8–23)
CO2: 28 mmol/L (ref 22–32)
Calcium: 9 mg/dL (ref 8.9–10.3)
Chloride: 100 mmol/L (ref 98–111)
Creatinine, Ser: 1.87 mg/dL — ABNORMAL HIGH (ref 0.44–1.00)
GFR, Estimated: 29 mL/min — ABNORMAL LOW (ref >60)
Glucose, Bld: 86 mg/dL (ref 70–99)
Potassium: 4.9 mmol/L (ref 3.5–5.1)
Sodium: 135 mmol/L (ref 135–145)

## 2021-08-27 LAB — GLUCOSE, CAPILLARY: Glucose-Capillary: 130 mg/dL — ABNORMAL HIGH (ref 70–99)

## 2021-08-27 NOTE — Discharge Summary (Signed)
Physician Discharge Summary  Dawn Sherman MHD:[id number] DOB: 05-21-1956 DOA: 08/19/2021  PCP: [name], MD  Admit date: 08/19/2021 Discharge date: 08/27/2021  Admitted From: Home Disposition: Home  Recommendations for Outpatient Follow-up:  Follow up with PCP in 1-2 weeks Please obtain BMP/CBC in one week Please follow up with pulmonology as discussed  Home Health: None Equipment/Devices: Nebulizer, oxygen 2 L nasal cannula  Discharge Condition: Stable CODE STATUS: Full Diet recommendation: Low-salt low-fat diet  Brief/Interim Summary: Pt with past medical history of congestive heart failure, chronic kidney disease 3B, ongoing tobacco abuse, B12 deficiency, hypertension presents with shortness of breath, worse with exertion, minimal swelling in legs but no orthopnea.  Found to have profound COPD exacerbation likely concurrent heart failure, both improving drastically.  Patient continues to be profoundly hypoxic, likely approaching baseline, will discharge on oxygen as below given maximize therapy with stabilized hypoxia.  Recommending close follow-up with PCP and pulmonology in the next few weeks, continue new inhaled medications including Trelegy, nebulizers every 4 hours at home as well as steroid taper.  Lengthy discussion daily about smoking cessation given her profound hypoxia she would likely only continue to worsen should she continue to smoke, she understands the risk factors included with ongoing tobacco abuse.  Discharge delayed due to inability to provide oxygen for patient at discharge. Unclear etiology - case management aware. Patient remains medically stable for discharge.   Acute hypoxic respiratory failure secondary to COPD exacerbation, POA Cannot rule out concurrent heart failure exacerbation(see below) POA.   -Admitted on 10 L high flow -unfortunately unable to wean patient back to her baseline of room air, discharged on 2 L nasal cannula around-the-clock,  close follow-up with PCP and pulmonology in the next few weeks   Chronic combined systolic and diastolic congestive heart failure.  -Echocardiogram showed a normal ejection fraction.   -Continue carvedilol, furosemide   Chronic disease stage IIIb.  -At baseline   GERD on Protonix   Depression on Effexor   Hyperlipidemia on atorvastatin   Paroxysmal atrial fibrillation on Eliquis for anticoagulation and low-dose Coreg for rate control   Discharge Instructions  Discharge Instructions     For home use only DME Nebulizer machine   Complete by: As directed    Patient needs a nebulizer to treat with the following condition: COPD (chronic obstructive pulmonary disease) ([place])   Length of Need: Lifetime   For home use only DME oxygen   Complete by: As directed    Length of Need: 6 Months   Mode or (Route): Nasal cannula   Liters per Minute: 2   Frequency: Continuous (stationary and portable oxygen unit needed)   Oxygen delivery system: Gas      Allergies as of 08/27/2021       Reactions   Amoxicillin    Benicar [olmesartan]    Talked with patient February 10, 2020, intolerance is unclear, tried several medications around that time and one of them gave her a rash but she is not clear which 1.   Codeine Sulfate    Morphine And Related    Penicillins    Sulfate    Clindamycin/lincomycin Rash        Medication List     STOP taking these medications    patiromer 8.4 g packet Commonly known as: VELTASSA       TAKE these medications    acetaminophen 325 MG tablet Commonly known as: TYLENOL Take 650 mg by mouth every 6 (six) hours as needed.  amLODipine 10 MG tablet Commonly known as: NORVASC TAKE 1 TABLET BY MOUTH EVERY DAY   atorvastatin 40 MG tablet Commonly known as: LIPITOR TAKE 1 TABLET BY MOUTH EVERY DAY   buPROPion 150 MG 24 hr tablet Commonly known as: WELLBUTRIN XL TAKE 1 TABLET BY MOUTH EVERY DAY   carvedilol 25 MG tablet Commonly known as:  COREG TAKE 1 TABLET BY MOUTH TWICE A DAY   Eliquis 2.5 MG Tabs tablet Generic drug: apixaban TAKE 1 TABLET BY MOUTH TWICE A DAY   furosemide 40 MG tablet Commonly known as: LASIX Take 1 tablet (40 mg total) by mouth daily.   ipratropium-albuterol 0.5-2.5 (3) MG/3ML Soln Commonly known as: DUONEB Take 3 mLs by nebulization every 4 (four) hours as needed (Shortness of breath/wheeze).   loratadine 10 MG tablet Commonly known as: CLARITIN Take 10 mg by mouth daily.   losartan 25 MG tablet Commonly known as: COZAAR Take 25 mg by mouth daily.   omeprazole 20 MG capsule Commonly known as: PRILOSEC Take 20 mg by mouth daily.   predniSONE 10 MG tablet Commonly known as: DELTASONE Take 4 tablets (40 mg total) by mouth daily for 3 days, THEN 3 tablets (30 mg total) daily for 3 days, THEN 2 tablets (20 mg total) daily for 3 days, THEN 1 tablet (10 mg total) daily for 3 days. Start taking on: August 26, 2021   Trelegy Ellipta 100-62.5-25 MCG/ACT Aepb Generic drug: Fluticasone-Umeclidin-Vilant Inhale 1 puff into the lungs daily.   venlafaxine XR 150 MG 24 hr capsule Commonly known as: EFFEXOR-XR TAKE 1 CAPSULE BY MOUTH EVERY DAY               Durable Medical Equipment  (From admission, onward)           Start     Ordered   08/26/21 0000  For home use only DME Nebulizer machine       Question Answer Comment  Patient needs a nebulizer to treat with the following condition COPD (chronic obstructive pulmonary disease) ([place])   Length of Need Lifetime      08/26/21 1525   08/26/21 0000  For home use only DME oxygen       Question Answer Comment  Length of Need 6 Months   Mode or (Route) Nasal cannula   Liters per Minute 2   Frequency Continuous (stationary and portable oxygen unit needed)   Oxygen delivery system Gas      08/26/21 1525            Allergies  Allergen Reactions   Amoxicillin    Benicar [Olmesartan]     Talked with patient February 10, 2020,  intolerance is unclear, tried several medications around that time and one of them gave her a rash but she is not clear which 1.   Codeine Sulfate    Morphine And Related    Penicillins    Sulfate    Clindamycin/Lincomycin Rash    Consultations: None  Procedures/Studies: DG Chest Portable 1 View  Result Date: 08/19/2021 CLINICAL DATA:  Shortness of breath. EXAM: PORTABLE CHEST 1 VIEW COMPARISON:  06/15/2020 FINDINGS: 1823 hours. Lungs are hyperexpanded. The lungs are clear without focal pneumonia, edema, pneumothorax or pleural effusion. The cardiopericardial silhouette is within normal limits for size. Multiple nonacute bilateral rib fractures evident. Telemetry leads overlie the chest. IMPRESSION: Hyperexpansion without acute cardiopulmonary findings. Electronically Signed   By: Misty Stanley M.D.   On: 08/19/2021 18:34   ECHOCARDIOGRAM COMPLETE  Result Date: 08/20/2021    ECHOCARDIOGRAM REPORT   Patient Name:   Dawn Sherman Date of Exam: 08/20/2021 Medical Rec #:  [id number]     Height:       65.0 in Accession #:    [id number]    Weight:       121.0 lb Date of Birth:  1956-01-03    BSA:          1.598 m Patient Age:    66 years      BP:           147/83 mmHg Patient Gender: F             HR:           102 bpm. Exam Location:  [facility] Procedure: 2D Echo, Color Doppler and Cardiac Doppler Indications:     I50.31 congestive heart failure-Acute Diastolic  History:         Patient has prior history of Echocardiogram examinations, most                  recent 08/09/2020. CHF, CKD; Risk Factors:Current Smoker,                  Hypertension and Dyslipidemia.  Sonographer:     Charmayne Sheer Referring Phys:  [place] Diagnosing Phys: Kathlyn Sacramento MD  Sonographer Comments: No subcostal window. IMPRESSIONS  1. Left ventricular ejection fraction, by estimation, is 50 to 55%. The left ventricle has low normal function. The left ventricle has no regional wall motion abnormalities. The left  ventricular internal cavity size was mildly dilated. Left ventricular diastolic parameters are indeterminate.  2. Right ventricular systolic function is normal. The right ventricular size is normal. Tricuspid regurgitation signal is inadequate for assessing PA pressure.  3. The mitral valve is normal in structure. No evidence of mitral valve regurgitation. No evidence of mitral stenosis.  4. The aortic valve is normal in structure. Aortic valve regurgitation is not visualized. Aortic valve sclerosis is present, with no evidence of aortic valve stenosis. Comparison(s): A prior study was performed on 02/10/2020. Prior images reviewed side by side. Changes from prior study are noted. LV systolic function improved. FINDINGS  Left Ventricle: Left ventricular ejection fraction, by estimation, is 50 to 55%. The left ventricle has low normal function. The left ventricle has no regional wall motion abnormalities. The left ventricular internal cavity size was mildly dilated. There is no left ventricular hypertrophy. Left ventricular diastolic parameters are indeterminate. Right Ventricle: The right ventricular size is normal. No increase in right ventricular wall thickness. Right ventricular systolic function is normal. Tricuspid regurgitation signal is inadequate for assessing PA pressure. Left Atrium: Left atrial size was normal in size. Right Atrium: Right atrial size was normal in size. Pericardium: There is no evidence of pericardial effusion. Mitral Valve: The mitral valve is normal in structure. Mild mitral annular calcification. No evidence of mitral valve regurgitation. No evidence of mitral valve stenosis. MV peak gradient, 9.2 mmHg. The mean mitral valve gradient is 4.0 mmHg. Tricuspid Valve: The tricuspid valve is normal in structure. Tricuspid valve regurgitation is not demonstrated. No evidence of tricuspid stenosis. Aortic Valve: The aortic valve is normal in structure. Aortic valve regurgitation is not  visualized. Aortic valve sclerosis is present, with no evidence of aortic valve stenosis. Aortic valve mean gradient measures 4.0 mmHg. Aortic valve peak gradient measures 6.8 mmHg. Aortic valve area, by VTI measures 1.84 cm. Pulmonic Valve: The pulmonic valve was  normal in structure. Pulmonic valve regurgitation is not visualized. No evidence of pulmonic stenosis. Aorta: The aortic root is normal in size and structure. Venous: The inferior vena cava was not well visualized. IAS/Shunts: No atrial level shunt detected by color flow Doppler.  LEFT VENTRICLE PLAX 2D LVIDd:         5.16 cm     Diastology LVIDs:         3.58 cm     LV e' medial:    12.90 cm/s LV PW:         1.03 cm     LV E/e' medial:  9.6 LV IVS:        0.70 cm     LV e' lateral:   9.36 cm/s LVOT diam:     2.00 cm     LV E/e' lateral: 13.2 LV SV:         45 LV SV Index:   28 LVOT Area:     3.14 cm  LV Volumes (MOD) LV vol d, MOD A2C: 72.3 ml LV vol d, MOD A4C: 74.7 ml LV vol s, MOD A2C: 34.6 ml LV vol s, MOD A4C: 40.2 ml LV SV MOD A2C:     37.7 ml LV SV MOD A4C:     74.7 ml LV SV MOD BP:      37.1 ml RIGHT VENTRICLE RV Basal diam:  2.80 cm LEFT ATRIUM             Index        RIGHT ATRIUM           Index LA diam:        4.00 cm 2.50 cm/m   RA Area:     10.50 cm LA Vol (A2C):   42.4 ml 26.54 ml/m  RA Volume:   23.60 ml  14.77 ml/m LA Vol (A4C):   39.1 ml 24.47 ml/m LA Biplane Vol: 41.5 ml 25.97 ml/m  AORTIC VALVE                    PULMONIC VALVE AV Area (Vmax):    2.16 cm     PV Vmax:       0.99 m/s AV Area (Vmean):   1.99 cm     PV Vmean:      71.200 cm/s AV Area (VTI):     1.84 cm     PV VTI:        0.157 m AV Vmax:           130.00 cm/s  PV Peak grad:  3.9 mmHg AV Vmean:          94.400 cm/s  PV Mean grad:  2.0 mmHg AV VTI:            0.242 m AV Peak Grad:      6.8 mmHg AV Mean Grad:      4.0 mmHg LVOT Vmax:         89.40 cm/s LVOT Vmean:        59.900 cm/s LVOT VTI:          0.142 m LVOT/AV VTI ratio: 0.59  AORTA Ao Root diam: 3.50 cm  MITRAL VALVE MV Area (PHT): 4.27 cm     SHUNTS MV Area VTI:   1.98 cm     Systemic VTI:  0.14 m MV Peak grad:  9.2 mmHg     Systemic Diam: 2.00 cm MV Mean grad:  4.0 mmHg MV Vmax:  1.52 m/s MV Vmean:      86.7 cm/s MV Decel Time: 178 msec MV E velocity: 123.50 cm/s Kathlyn Sacramento MD Electronically signed by Kathlyn Sacramento MD Signature Date/Time: 08/20/2021/10:56:16 AM    Final      Subjective: No acute issues or events overnight denies nausea vomiting diarrhea constipation headache fevers chills or chest pain, shortness of breath improving but not yet back to baseline   Discharge Exam: Vitals:   08/26/21 2002 08/27/21 0450  BP: 131/61 133/75  Pulse: 81 75  Resp: 18 18  Temp: 98 F (36.7 C) 97.9 F (36.6 C)  SpO2: 100% 96%   Vitals:   08/26/21 1628 08/26/21 1958 08/26/21 2002 08/27/21 0450  BP: 114/69  131/61 133/75  Pulse: 81 84 81 75  Resp: 16  18 18   Temp: 97.7 F (36.5 C)  98 F (36.7 C) 97.9 F (36.6 C)  TempSrc: Oral  Oral Oral  SpO2: 97% 100% 100% 96%  Weight:      Height:        General:  Pleasantly resting in bed, No acute distress. HEENT:  Normocephalic atraumatic.  Sclerae nonicteric, noninjected.  Extraocular movements intact bilaterally. Neck:  Without mass or deformity.  Trachea is midline. Lungs: Moderately diminished bilaterally without rhonchi, wheeze, or rales. Heart:  Regular rate and rhythm.  Without murmurs, rubs, or gallops. Abdomen:  Soft, nontender, nondistended.  Without guarding or rebound. Extremities: Without cyanosis, clubbing, edema, or obvious deformity. Vascular:  Dorsalis pedis and posterior tibial pulses palpable bilaterally. Skin:  Warm and dry, no erythema, no ulcerations.   The results of significant diagnostics from this hospitalization (including imaging, microbiology, ancillary and laboratory) are listed below for reference.     Microbiology: Recent Results (from the past 240 hour(s))  Resp Panel by RT-PCR (Flu A&B, Covid)  Nasopharyngeal Swab     Status: None   Collection Time: 08/19/21  6:03 PM   Specimen: Nasopharyngeal Swab; Nasopharyngeal(NP) swabs in vial transport medium  Result Value Ref Range Status   SARS Coronavirus 2 by RT PCR NEGATIVE NEGATIVE Final    Comment: (NOTE) SARS-CoV-2 target nucleic acids are NOT DETECTED.  The SARS-CoV-2 RNA is generally detectable in upper respiratory specimens during the acute phase of infection. The lowest concentration of SARS-CoV-2 viral copies this assay can detect is 138 copies/mL. A negative result does not preclude SARS-Cov-2 infection and should not be used as the sole basis for treatment or other patient management decisions. A negative result may occur with  improper specimen collection/handling, submission of specimen other than nasopharyngeal swab, presence of viral mutation(s) within the areas targeted by this assay, and inadequate number of viral copies(<138 copies/mL). A negative result must be combined with clinical observations, patient history, and epidemiological information. The expected result is Negative.  Fact Sheet for Patients:  EntrepreneurPulse.com.au  Fact Sheet for Healthcare Providers:  IncredibleEmployment.be  This test is no t yet approved or cleared by the [place] FDA and  has been authorized for detection and/or diagnosis of SARS-CoV-2 by FDA under an Emergency Use Authorization (EUA). This EUA will remain  in effect (meaning this test can be used) for the duration of the COVID-19 declaration under Section 564(b)(1) of the Act, 21 U.S.C.section 360bbb-3(b)(1), unless the authorization is terminated  or revoked sooner.       Influenza A by PCR NEGATIVE NEGATIVE Final   Influenza B by PCR NEGATIVE NEGATIVE Final    Comment: (NOTE) The Xpert Xpress SARS-CoV-2/FLU/RSV plus assay is intended  as an aid in the diagnosis of influenza from Nasopharyngeal swab specimens and should not be  used as a sole basis for treatment. Nasal washings and aspirates are unacceptable for Xpert Xpress SARS-CoV-2/FLU/RSV testing.  Fact Sheet for Patients: EntrepreneurPulse.com.au  Fact Sheet for Healthcare Providers: IncredibleEmployment.be  This test is not yet approved or cleared by the [place] FDA and has been authorized for detection and/or diagnosis of SARS-CoV-2 by FDA under an Emergency Use Authorization (EUA). This EUA will remain in effect (meaning this test can be used) for the duration of the COVID-19 declaration under Section 564(b)(1) of the Act, 21 U.S.C. section 360bbb-3(b)(1), unless the authorization is terminated or revoked.  Performed at [facility], [address] [place], [place] [address]      Labs: BNP (last 3 results) Recent Labs    08/19/21 1803  BNP 378.1*    Basic Metabolic Panel: Recent Labs  Lab 08/22/21 0440 08/23/21 0424 08/25/21 0608 08/27/21 0433  NA 136 135 136 135  K 4.6 4.7 4.8 4.9  CL 101 101 99 100  CO2 25 26 29 28   GLUCOSE 112* 103* 80 86  BUN 50* 56* 60* 68*  CREATININE 1.74* 1.69* 1.96* 1.87*  CALCIUM 9.3 8.9 8.9 9.0  MG 2.0  --   --   --     Liver Function Tests: No results for input(s): AST, ALT, ALKPHOS, BILITOT, PROT, ALBUMIN in the last 168 hours.  No results for input(s): LIPASE, AMYLASE in the last 168 hours. No results for input(s): AMMONIA in the last 168 hours. CBC: Recent Labs  Lab 08/22/21 0440 08/23/21 0424 08/25/21 0608 08/27/21 0433  WBC 13.6* 12.3* 11.5* 13.4*  HGB 12.6 12.6 12.6 12.2  HCT 38.1 38.2 37.9 36.8  MCV 87.4 86.0 86.9 87.4  PLT 193 216 230 253    Cardiac Enzymes: No results for input(s): CKTOTAL, CKMB, CKMBINDEX, TROPONINI in the last 168 hours. BNP: Invalid input(s): POCBNP CBG: Recent Labs  Lab 08/25/21 2121 08/26/21 0937 08/26/21 1210 08/26/21 1628 08/26/21 2010  GLUCAP 170* 185* 105* 152* 171*    D-Dimer No  results for input(s): DDIMER in the last 72 hours. Hgb A1c No results for input(s): HGBA1C in the last 72 hours. Lipid Profile No results for input(s): CHOL, HDL, LDLCALC, TRIG, CHOLHDL, LDLDIRECT in the last 72 hours. Thyroid function studies No results for input(s): TSH, T4TOTAL, T3FREE, THYROIDAB in the last 72 hours.  Invalid input(s): FREET3 Anemia work up No results for input(s): VITAMINB12, FOLATE, FERRITIN, TIBC, IRON, RETICCTPCT in the last 72 hours. Urinalysis    Component Value Date/Time   COLORURINE STRAW (A) 06/15/2020 1117   APPEARANCEUR CLEAR (A) 06/15/2020 1117   LABSPEC 1.004 (L) 06/15/2020 1117   PHURINE 6.0 06/15/2020 1117   GLUCOSEU NEGATIVE 06/15/2020 1117   GLUCOSEU NEGATIVE 01/22/2017 1052   HGBUR NEGATIVE 06/15/2020 1117   BILIRUBINUR NEGATIVE 06/15/2020 1117   KETONESUR 5 (A) 06/15/2020 1117   PROTEINUR 30 (A) 06/15/2020 1117   UROBILINOGEN 0.2 01/22/2017 1052   NITRITE NEGATIVE 06/15/2020 1117   LEUKOCYTESUR NEGATIVE 06/15/2020 1117   Sepsis Labs Invalid input(s): PROCALCITONIN,  WBC,  LACTICIDVEN Microbiology Recent Results (from the past 240 hour(s))  Resp Panel by RT-PCR (Flu A&B, Covid) Nasopharyngeal Swab     Status: None   Collection Time: 08/19/21  6:03 PM   Specimen: Nasopharyngeal Swab; Nasopharyngeal(NP) swabs in vial transport medium  Result Value Ref Range Status   SARS Coronavirus 2 by RT PCR NEGATIVE NEGATIVE Final  Comment: (NOTE) SARS-CoV-2 target nucleic acids are NOT DETECTED.  The SARS-CoV-2 RNA is generally detectable in upper respiratory specimens during the acute phase of infection. The lowest concentration of SARS-CoV-2 viral copies this assay can detect is 138 copies/mL. A negative result does not preclude SARS-Cov-2 infection and should not be used as the sole basis for treatment or other patient management decisions. A negative result may occur with  improper specimen collection/handling, submission of specimen  other than nasopharyngeal swab, presence of viral mutation(s) within the areas targeted by this assay, and inadequate number of viral copies(<138 copies/mL). A negative result must be combined with clinical observations, patient history, and epidemiological information. The expected result is Negative.  Fact Sheet for Patients:  EntrepreneurPulse.com.au  Fact Sheet for Healthcare Providers:  IncredibleEmployment.be  This test is no t yet approved or cleared by the [place] FDA and  has been authorized for detection and/or diagnosis of SARS-CoV-2 by FDA under an Emergency Use Authorization (EUA). This EUA will remain  in effect (meaning this test can be used) for the duration of the COVID-19 declaration under Section 564(b)(1) of the Act, 21 U.S.C.section 360bbb-3(b)(1), unless the authorization is terminated  or revoked sooner.       Influenza A by PCR NEGATIVE NEGATIVE Final   Influenza B by PCR NEGATIVE NEGATIVE Final    Comment: (NOTE) The Xpert Xpress SARS-CoV-2/FLU/RSV plus assay is intended as an aid in the diagnosis of influenza from Nasopharyngeal swab specimens and should not be used as a sole basis for treatment. Nasal washings and aspirates are unacceptable for Xpert Xpress SARS-CoV-2/FLU/RSV testing.  Fact Sheet for Patients: EntrepreneurPulse.com.au  Fact Sheet for Healthcare Providers: IncredibleEmployment.be  This test is not yet approved or cleared by the [place] FDA and has been authorized for detection and/or diagnosis of SARS-CoV-2 by FDA under an Emergency Use Authorization (EUA). This EUA will remain in effect (meaning this test can be used) for the duration of the COVID-19 declaration under Section 564(b)(1) of the Act, 21 U.S.C. section 360bbb-3(b)(1), unless the authorization is terminated or revoked.  Performed at [facility], [address],  [place], [place] [address]      Time coordinating discharge: Over 30 minutes  SIGNED:   Little Ishikawa, DO [facility] 08/27/2021, 7:14 AM Pager   If 7PM-7AM, please contact night-coverage [email or web address]

## 2021-08-27 NOTE — TOC Transition Note (Signed)
Transition of Care [facility]) - CM/SW Discharge Note   Patient Details  Name: Dawn Sherman MRN: [id number] Date of Birth: [birth year]  [facility]) CM/SW Contact:  Candie Chroman, LCSW Phone Number: 08/27/2021, 12:50 PM   Clinical Narrative:   Patient has orders to discharge home today. Oxygen and nebulizer machine have been ordered through [facility] and delivered to the room. Patient declined home health RN. She will notify her PCP if she changes her mind. No further concerns. CSW signing off.  Final next level of care: Home/Self Care Barriers to Discharge: Barriers Resolved   Patient Goals and CMS Choice     Choice offered to / list presented to : NA  Discharge Placement                    Patient and family notified of of transfer: 08/27/21  Discharge Plan and Services     Post Acute Care Choice: NA          DME Arranged: Oxygen, Nebulizer machine DME Agency: [employer] Date DME Agency Contacted: 08/27/21   Representative spoke with at DME Agency: Suanne Marker            Social Determinants of Health ([place]) Interventions     Readmission Risk Interventions No flowsheet data found.

## 2021-08-28 ENCOUNTER — Telehealth: Payer: Self-pay

## 2021-08-28 NOTE — Telephone Encounter (Signed)
Reviewed note.  Needs two week notice.  See if she could come in on 09/10/21 - 12:00.

## 2021-08-28 NOTE — Telephone Encounter (Signed)
Transition Care Management Follow-up Telephone Call Date of discharge and from where: 08/27/21-[facility] How have you been since you were released from the hospital? Patient notes, "I am doing fine. Oxygen in use at 1.5 L prn. Pacing self with activity. Nebulizer prn. Intermittent productive cough. Inhaler picked up from pharmacy today." Denies pain, headache, dizziness, fever and all other symptoms. Any questions or concerns? No  Items Reviewed: Did the pt receive and understand the discharge instructions provided? Yes  Medications obtained and verified? Yes  Any new allergies since your discharge? No  Dietary orders reviewed? Yes Do you have support at home? Yes   Home Care and Equipment/Supplies: Were home health services ordered? No  Functional Questionnaire: (I = Independent and D = Dependent) ADLs: I  Bathing/Dressing- I  Transferring/Ambulation- I  Managing Meds- I  Follow up appointments reviewed:  PCP Hospital f/u appt confirmed? Yes  Scheduled to see PCP on 09/26/21 @ 11:00. Patient notes she is willing to reschedule sooner but needs 2 weeks notice of new schedule date. Husband to bring her to appointment and needs 2 week window to notify his job.  [facility] f/u appt confirmed? Scheduled with Darylene Price 09/17/21 at 9:30. Awaiting scheduling with pulmonology until after visit with pcp.  Are transportation arrangements needed? No  If their condition worsens, is the pt aware to call PCP or go to the Emergency Dept.? Yes Was the patient provided with contact information for the PCP's office or ED? Yes Was to pt encouraged to call back with questions or concerns? Yes

## 2021-08-29 NOTE — Telephone Encounter (Signed)
Patient has been scheduled

## 2021-09-04 ENCOUNTER — Ambulatory Visit: Payer: Medicare Other | Admitting: Family

## 2021-09-10 ENCOUNTER — Ambulatory Visit (INDEPENDENT_AMBULATORY_CARE_PROVIDER_SITE_OTHER): Payer: Medicare Other | Admitting: Internal Medicine

## 2021-09-10 ENCOUNTER — Encounter: Payer: Self-pay | Admitting: Internal Medicine

## 2021-09-10 ENCOUNTER — Ambulatory Visit (INDEPENDENT_AMBULATORY_CARE_PROVIDER_SITE_OTHER): Payer: Medicare Other

## 2021-09-10 ENCOUNTER — Other Ambulatory Visit: Payer: Self-pay

## 2021-09-10 VITALS — BP 142/88 | HR 82 | Ht 65.0 in | Wt 127.0 lb

## 2021-09-10 DIAGNOSIS — I480 Paroxysmal atrial fibrillation: Secondary | ICD-10-CM | POA: Diagnosis not present

## 2021-09-10 DIAGNOSIS — I10 Essential (primary) hypertension: Secondary | ICD-10-CM | POA: Diagnosis not present

## 2021-09-10 DIAGNOSIS — N1832 Chronic kidney disease, stage 3b: Secondary | ICD-10-CM | POA: Diagnosis not present

## 2021-09-10 DIAGNOSIS — K219 Gastro-esophageal reflux disease without esophagitis: Secondary | ICD-10-CM

## 2021-09-10 DIAGNOSIS — M79672 Pain in left foot: Secondary | ICD-10-CM | POA: Diagnosis not present

## 2021-09-10 DIAGNOSIS — J432 Centrilobular emphysema: Secondary | ICD-10-CM

## 2021-09-10 DIAGNOSIS — J9601 Acute respiratory failure with hypoxia: Secondary | ICD-10-CM

## 2021-09-10 DIAGNOSIS — R0602 Shortness of breath: Secondary | ICD-10-CM

## 2021-09-10 DIAGNOSIS — I5042 Chronic combined systolic (congestive) and diastolic (congestive) heart failure: Secondary | ICD-10-CM

## 2021-09-10 DIAGNOSIS — Z720 Tobacco use: Secondary | ICD-10-CM

## 2021-09-10 DIAGNOSIS — E7800 Pure hypercholesterolemia, unspecified: Secondary | ICD-10-CM

## 2021-09-10 LAB — BASIC METABOLIC PANEL
BUN: 25 mg/dL — ABNORMAL HIGH (ref 6–23)
CO2: 29 mEq/L (ref 19–32)
Calcium: 9 mg/dL (ref 8.4–10.5)
Chloride: 95 mEq/L — ABNORMAL LOW (ref 96–112)
Creatinine, Ser: 1.7 mg/dL — ABNORMAL HIGH (ref 0.40–1.20)
GFR: 31.11 mL/min — ABNORMAL LOW (ref >60.00)
Glucose, Bld: 66 mg/dL — ABNORMAL LOW (ref 70–99)
Potassium: 5 mEq/L (ref 3.5–5.1)
Sodium: 132 mEq/L — ABNORMAL LOW (ref 135–145)

## 2021-09-10 LAB — CBC WITH DIFFERENTIAL/PLATELET
Basophils Absolute: 0.1 10*3/uL (ref 0.0–0.1)
Basophils Relative: 0.6 % (ref 0.0–3.0)
Eosinophils Absolute: 0.2 10*3/uL (ref 0.0–0.7)
Eosinophils Relative: 2 % (ref 0.0–5.0)
HCT: 38.1 % (ref 36.0–46.0)
Hemoglobin: 11.9 g/dL — ABNORMAL LOW (ref 12.0–15.0)
Lymphocytes Relative: 17 % (ref 12.0–46.0)
Lymphs Abs: 1.6 10*3/uL (ref 0.7–4.0)
MCHC: 31.2 g/dL (ref 30.0–36.0)
MCV: 88.6 fl (ref 78.0–100.0)
Monocytes Absolute: 1 10*3/uL (ref 0.1–1.0)
Monocytes Relative: 10.4 % (ref 3.0–12.0)
Neutro Abs: 6.6 10*3/uL (ref 1.4–7.7)
Neutrophils Relative %: 70 % (ref 43.0–77.0)
Platelets: 204 10*3/uL (ref 150.0–400.0)
RBC: 4.3 Mil/uL (ref 3.87–5.11)
RDW: 15.4 % (ref 11.5–15.5)
WBC: 9.5 10*3/uL (ref 4.0–10.5)

## 2021-09-10 NOTE — Progress Notes (Signed)
Patient ID: Dawn Sherman, female   DOB: [birth year], 66 y.o.   MRN: [id number]   Subjective:    Patient ID: Dawn Sherman, female    DOB: [birth year], 66 y.o.   MRN: [id number]  This visit occurred during the SARS-CoV-2 public health emergency.  Safety protocols were in place, including screening questions prior to the visit, additional usage of staff PPE, and extensive cleaning of exam room while observing appropriate contact time as indicated for disinfecting solutions.   Patient here for hospital follow up.   Chief Complaint  Patient presents with   Hospitalization Follow-up   .   HPI Hospitalized 08/19/21 - 08/27/21 after presenting with sob.  Per review, was found to have acute hypoxic respiratory failure secondary to COPD exacerbation.  Admitted on 10 L high flow -unfortunately unable to wean patient back to her baseline of room air, discharged on 2 L nasal cannula around-the-clock.  ECHO showed normal ejection fraction.  Recommended continuing carvedilol and lasix.  Started on trelegy and nebulizers.  Completed steroid taper.  Since her discharge, she reports feeling better.  Breathing better.  No chest pain.  Feeling more at her baseline.  Not wearing her oxygen today.  Eating.  No vomiting.  Bowels stable.  Following oxygen - 99%.  Does report increased pain - left foot with increased swelling - more localized left foot.     Past Medical History:  Diagnosis Date   B12 deficiency    CHF (congestive heart failure) (HCC)    CKD (chronic kidney disease), stage III (HCC)    Depression    Endometriosis    Hypertension    Mixed hyperlipidemia    Tobacco abuse    Past Surgical History:  Procedure Laterality Date   ABDOMINAL HYSTERECTOMY     BREAST BIOPSY Right 2015   neg-  FIBROADENOMA   TAH/RSO  1999   secondary to bleeding and endometriosis (Dr Vernie Ammons)   Family History  Problem Relation Age of Onset   Hypercholesterolemia Mother    Rheum arthritis Father    Breast cancer  Neg Hx    Colon cancer Neg Hx    Social History   Socioeconomic History   Marital status: Married    Spouse name: Not on file   Number of children: Not on file   Years of education: Not on file   Highest education level: Not on file  Occupational History   Not on file  Tobacco Use   Smoking status: Every Day    Packs/day: 0.25    Years: 40.00    Pack years: 10.00    Types: Cigarettes   Smokeless tobacco: Never  Vaping Use   Vaping Use: Never used  Substance and Sexual Activity   Alcohol use: Not Currently    Alcohol/week: 0.0 standard drinks    Comment: occasional   Drug use: No   Sexual activity: Not Currently  Other Topics Concern   Not on file  Social History Narrative   Lives in [place] with her husband.  She is retired from [employer].  She does not routinely exercise.   Social Determinants of Health   Financial Resource Strain: Medium Risk   Difficulty of Paying Living Expenses: Somewhat hard  Food Insecurity: Not on file  Transportation Needs: Not on file  Physical Activity: Not on file  Stress: No Stress Concern Present   Feeling of Stress : Only a little  Social Connections: Not on file     Review  of Systems  Constitutional:  Negative for appetite change and unexpected weight change.  HENT:  Negative for congestion and sinus pressure.   Respiratory:  Negative for cough and chest tightness.        Breathing improved.    Cardiovascular:  Negative for chest pain, palpitations and leg swelling.  Gastrointestinal:  Negative for abdominal pain, diarrhea, nausea and vomiting.  Genitourinary:  Negative for difficulty urinating and dysuria.  Musculoskeletal:  Negative for joint swelling and myalgias.  Skin:  Negative for color change and rash.  Neurological:  Negative for dizziness, light-headedness and headaches.  Psychiatric/Behavioral:  Negative for agitation and dysphoric mood.       Objective:     BP (!) 142/88    Pulse 82    Ht 5\' 5"   (1.651 m)    Wt 127 lb (57.6 kg)    SpO2 95%    BMI 21.13 kg/m  Wt Readings from Last 3 Encounters:  09/10/21 127 lb (57.6 kg)  08/19/21 121 lb (54.9 kg)  06/26/21 117 lb 3.2 oz (53.2 kg)    Physical Exam Vitals reviewed.  Constitutional:      General: She is not in acute distress.    Appearance: Normal appearance.  HENT:     Head: Normocephalic and atraumatic.     Right Ear: External ear normal.     Left Ear: External ear normal.  Eyes:     General: No scleral icterus.       Right eye: No discharge.        Left eye: No discharge.     Conjunctiva/sclera: Conjunctivae normal.  Neck:     Thyroid: No thyromegaly.  Cardiovascular:     Rate and Rhythm: Normal rate and regular rhythm.  Pulmonary:     Effort: No respiratory distress.     Breath sounds: Normal breath sounds. No wheezing.  Abdominal:     General: Bowel sounds are normal.     Palpations: Abdomen is soft.     Tenderness: There is no abdominal tenderness.  Musculoskeletal:        General: No tenderness.     Cervical back: Neck supple. No tenderness.     Comments: Increased swelling - more localized - left foot with point tenderness - fifth metatarsal and up to base of ankle.  Minimal lower extremity swelling.    Lymphadenopathy:     Cervical: No cervical adenopathy.  Skin:    Findings: No erythema or rash.  Neurological:     Mental Status: She is alert.  Psychiatric:        Mood and Affect: Mood normal.        Behavior: Behavior normal.     Outpatient Encounter Medications as of 09/10/2021  Medication Sig   acetaminophen (TYLENOL) 325 MG tablet Take 650 mg by mouth every 6 (six) hours as needed.   amLODipine (NORVASC) 10 MG tablet TAKE 1 TABLET BY MOUTH EVERY DAY   atorvastatin (LIPITOR) 40 MG tablet TAKE 1 TABLET BY MOUTH EVERY DAY   buPROPion (WELLBUTRIN XL) 150 MG 24 hr tablet TAKE 1 TABLET BY MOUTH EVERY DAY   carvedilol (COREG) 25 MG tablet TAKE 1 TABLET BY MOUTH TWICE A DAY   ELIQUIS 2.5 MG TABS  tablet TAKE 1 TABLET BY MOUTH TWICE A DAY   Fluticasone-Umeclidin-Vilant (TRELEGY ELLIPTA) 100-62.5-25 MCG/ACT AEPB Inhale 1 puff into the lungs daily.   furosemide (LASIX) 40 MG tablet Take 1 tablet (40 mg total) by mouth daily.   ipratropium-albuterol (  DUONEB) 0.5-2.5 (3) MG/3ML SOLN Take 3 mLs by nebulization every 4 (four) hours as needed (Shortness of breath/wheeze).   loratadine (CLARITIN) 10 MG tablet Take 10 mg by mouth daily.   losartan (COZAAR) 25 MG tablet Take 25 mg by mouth daily.   omeprazole (PRILOSEC) 20 MG capsule Take 20 mg by mouth daily.   venlafaxine XR (EFFEXOR-XR) 150 MG 24 hr capsule TAKE 1 CAPSULE BY MOUTH EVERY DAY   No facility-administered encounter medications on file as of 09/10/2021.     Lab Results  Component Value Date   WBC 9.5 09/10/2021   HGB 11.9 (L) 09/10/2021   HCT 38.1 09/10/2021   PLT 204.0 09/10/2021   GLUCOSE 66 (L) 09/10/2021   CHOL 180 02/23/2021   TRIG 95.0 02/23/2021   HDL 76.10 02/23/2021   LDLDIRECT 53.3 10/14/2012   LDLCALC 85 02/23/2021   ALT 11 08/20/2021   AST 13 (L) 08/20/2021   NA 132 (L) 09/10/2021   K 5.0 09/10/2021   CL 95 (L) 09/10/2021   CREATININE 1.70 (H) 09/10/2021   BUN 25 (H) 09/10/2021   CO2 29 09/10/2021   TSH 2.90 02/23/2021   INR 1.0 12/13/2013   HGBA1C 6.2 06/26/2021    ECHOCARDIOGRAM COMPLETE  Result Date: 08/20/2021    ECHOCARDIOGRAM REPORT   Patient Name:   Dawn Sherman Date of Exam: 08/20/2021 Medical Rec #:  [id number]     Height:       65.0 in Accession #:    [id number]    Weight:       121.0 lb Date of Birth:  [birth year]    BSA:          1.598 m Patient Age:    64 years      BP:           147/83 mmHg Patient Gender: F             HR:           102 bpm. Exam Location:  [facility] Procedure: 2D Echo, Color Doppler and Cardiac Doppler Indications:     I50.31 congestive heart failure-Acute Diastolic  History:         Patient has prior history of Echocardiogram examinations, most                  recent  08/09/2020. CHF, CKD; Risk Factors:Current Smoker,                  Hypertension and Dyslipidemia.  Sonographer:     Charmayne Sheer Referring Phys:  [place] Diagnosing Phys: Kathlyn Sacramento MD  Sonographer Comments: No subcostal window. IMPRESSIONS  1. Left ventricular ejection fraction, by estimation, is 50 to 55%. The left ventricle has low normal function. The left ventricle has no regional wall motion abnormalities. The left ventricular internal cavity size was mildly dilated. Left ventricular diastolic parameters are indeterminate.  2. Right ventricular systolic function is normal. The right ventricular size is normal. Tricuspid regurgitation signal is inadequate for assessing PA pressure.  3. The mitral valve is normal in structure. No evidence of mitral valve regurgitation. No evidence of mitral stenosis.  4. The aortic valve is normal in structure. Aortic valve regurgitation is not visualized. Aortic valve sclerosis is present, with no evidence of aortic valve stenosis. Comparison(s): A prior study was performed on 02/10/2020. Prior images reviewed side by side. Changes from prior study are noted. LV systolic function improved. FINDINGS  Left Ventricle: Left ventricular ejection  fraction, by estimation, is 50 to 55%. The left ventricle has low normal function. The left ventricle has no regional wall motion abnormalities. The left ventricular internal cavity size was mildly dilated. There is no left ventricular hypertrophy. Left ventricular diastolic parameters are indeterminate. Right Ventricle: The right ventricular size is normal. No increase in right ventricular wall thickness. Right ventricular systolic function is normal. Tricuspid regurgitation signal is inadequate for assessing PA pressure. Left Atrium: Left atrial size was normal in size. Right Atrium: Right atrial size was normal in size. Pericardium: There is no evidence of pericardial effusion. Mitral Valve: The mitral valve is normal in  structure. Mild mitral annular calcification. No evidence of mitral valve regurgitation. No evidence of mitral valve stenosis. MV peak gradient, 9.2 mmHg. The mean mitral valve gradient is 4.0 mmHg. Tricuspid Valve: The tricuspid valve is normal in structure. Tricuspid valve regurgitation is not demonstrated. No evidence of tricuspid stenosis. Aortic Valve: The aortic valve is normal in structure. Aortic valve regurgitation is not visualized. Aortic valve sclerosis is present, with no evidence of aortic valve stenosis. Aortic valve mean gradient measures 4.0 mmHg. Aortic valve peak gradient measures 6.8 mmHg. Aortic valve area, by VTI measures 1.84 cm. Pulmonic Valve: The pulmonic valve was normal in structure. Pulmonic valve regurgitation is not visualized. No evidence of pulmonic stenosis. Aorta: The aortic root is normal in size and structure. Venous: The inferior vena cava was not well visualized. IAS/Shunts: No atrial level shunt detected by color flow Doppler.  LEFT VENTRICLE PLAX 2D LVIDd:         5.16 cm     Diastology LVIDs:         3.58 cm     LV e' medial:    12.90 cm/s LV PW:         1.03 cm     LV E/e' medial:  9.6 LV IVS:        0.70 cm     LV e' lateral:   9.36 cm/s LVOT diam:     2.00 cm     LV E/e' lateral: 13.2 LV SV:         45 LV SV Index:   28 LVOT Area:     3.14 cm  LV Volumes (MOD) LV vol d, MOD A2C: 72.3 ml LV vol d, MOD A4C: 74.7 ml LV vol s, MOD A2C: 34.6 ml LV vol s, MOD A4C: 40.2 ml LV SV MOD A2C:     37.7 ml LV SV MOD A4C:     74.7 ml LV SV MOD BP:      37.1 ml RIGHT VENTRICLE RV Basal diam:  2.80 cm LEFT ATRIUM             Index        RIGHT ATRIUM           Index LA diam:        4.00 cm 2.50 cm/m   RA Area:     10.50 cm LA Vol (A2C):   42.4 ml 26.54 ml/m  RA Volume:   23.60 ml  14.77 ml/m LA Vol (A4C):   39.1 ml 24.47 ml/m LA Biplane Vol: 41.5 ml 25.97 ml/m  AORTIC VALVE                    PULMONIC VALVE AV Area (Vmax):    2.16 cm     PV Vmax:       0.99 m/s AV Area (Vmean):  1.99 cm     PV Vmean:      71.200 cm/s AV Area (VTI):     1.84 cm     PV VTI:        0.157 m AV Vmax:           130.00 cm/s  PV Peak grad:  3.9 mmHg AV Vmean:          94.400 cm/s  PV Mean grad:  2.0 mmHg AV VTI:            0.242 m AV Peak Grad:      6.8 mmHg AV Mean Grad:      4.0 mmHg LVOT Vmax:         89.40 cm/s LVOT Vmean:        59.900 cm/s LVOT VTI:          0.142 m LVOT/AV VTI ratio: 0.59  AORTA Ao Root diam: 3.50 cm MITRAL VALVE MV Area (PHT): 4.27 cm     SHUNTS MV Area VTI:   1.98 cm     Systemic VTI:  0.14 m MV Peak grad:  9.2 mmHg     Systemic Diam: 2.00 cm MV Mean grad:  4.0 mmHg MV Vmax:       1.52 m/s MV Vmean:      86.7 cm/s MV Decel Time: 178 msec MV E velocity: 123.50 cm/s Kathlyn Sacramento MD Electronically signed by Kathlyn Sacramento MD Signature Date/Time: 08/20/2021/10:56:16 AM    Final        Assessment & Plan:   Problem List Items Addressed This Visit     Acute respiratory failure with hypoxia ([place])    Discharged on oxygen.  Off today. Pulse ox ok.  Continues nocturnal oxygen.  Keep f/u with pulmonary.  Will need to reassess need.        Chronic combined systolic and diastolic CHF (congestive heart failure) ([place])    Off entresto due to hyperkalemia. Continue coreg and eliquis.  Discharged on lasix.  No evidence of significant volume overload.  Breathing better.  Follow.       Chronic obstructive pulmonary disease ([place])    Recently admitted with copd exacerbation.  Breathing better now.  Continue trelegy and nebs.  F/u with pulmonary as recommended.        Relevant Orders   [facility]   CKD (chronic kidney disease) stage 3, GFR 30-59 ml/min (HCC)    Continue to avoid antiinflammatories.  Has been evaluated by nephrology.  Discharged on lasix.  Losartan stopped in hospital.  Check metabolic panel today.       Relevant Orders   CBC with Differential/Platelet (Completed)   Basic metabolic panel (Completed)   Foot pain, left - Primary     Increased point tenderness and increased more localized swelling as outlined.  No known injury or trauma.  On lasix.  No significant increased lower extremity swelling.  Breathing better.  Given point tenderness as outlined, check xray.  Further w/up pending results.  Leg elevation.        Relevant Orders   DG Foot Complete Left (Completed)   GERD (gastroesophageal reflux disease)    No upper symptoms reported.  On prilosec.       Hypercholesterolemia    Continue lipitor.  Low cholesterol diet and exercise.  Follow lipid panel and liver function tests.        Hypertension    Off entresto due to hyperkalemia.  Continue coreg and  eliquis.  Saw nephrology recently.  Was placed on losartan.  States this was stopped in hospital.  Follow pressures.  Follow metabolic panel.       Paroxysmal A-fib (HCC)    Documented paroxysmal afib.  Continue eliquis and coreg.  No increased heart rate or palpitations. Appears to be in SR.  Follow.       SOB (shortness of breath)    Much improved.  Continue trelegy. Follow.  Cost is an issue.  CCM Referral.       Tobacco abuse    Have discussed the need to quit.  Follow.         [name], MD

## 2021-09-11 ENCOUNTER — Other Ambulatory Visit: Payer: Self-pay

## 2021-09-11 DIAGNOSIS — E875 Hyperkalemia: Secondary | ICD-10-CM

## 2021-09-16 ENCOUNTER — Encounter: Payer: Self-pay | Admitting: Internal Medicine

## 2021-09-16 NOTE — Assessment & Plan Note (Signed)
Continue lipitor.  Low cholesterol diet and exercise.  Follow lipid panel and liver function tests.   

## 2021-09-16 NOTE — Assessment & Plan Note (Signed)
Discharged on oxygen.  Off today. Pulse ox ok.  Continues nocturnal oxygen.  Keep f/u with pulmonary.  Will need to reassess need.

## 2021-09-16 NOTE — Assessment & Plan Note (Signed)
Recently admitted with copd exacerbation.  Breathing better now.  Continue trelegy and nebs.  F/u with pulmonary as recommended.

## 2021-09-16 NOTE — Assessment & Plan Note (Signed)
Documented paroxysmal afib.  Continue eliquis and coreg.  No increased heart rate or palpitations. Appears to be in SR.  Follow.

## 2021-09-16 NOTE — Assessment & Plan Note (Signed)
Have discussed the need to quit.  Follow.  

## 2021-09-16 NOTE — Assessment & Plan Note (Signed)
Continue to avoid antiinflammatories.  Has been evaluated by nephrology.  Discharged on lasix.  Losartan stopped in hospital.  Check metabolic panel today.

## 2021-09-16 NOTE — Assessment & Plan Note (Signed)
Increased point tenderness and increased more localized swelling as outlined.  No known injury or trauma.  On lasix.  No significant increased lower extremity swelling.  Breathing better.  Given point tenderness as outlined, check xray.  Further w/up pending results.  Leg elevation.

## 2021-09-16 NOTE — Assessment & Plan Note (Signed)
No upper symptoms reported. On prilosec.  

## 2021-09-16 NOTE — Assessment & Plan Note (Signed)
Off entresto due to hyperkalemia.  Continue coreg and eliquis.  Saw nephrology recently.  Was placed on losartan.  States this was stopped in hospital.  Follow pressures.  Follow metabolic panel.

## 2021-09-16 NOTE — Assessment & Plan Note (Signed)
Much improved.  Continue trelegy. Follow.  Cost is an issue.  CCM Referral.

## 2021-09-16 NOTE — Assessment & Plan Note (Signed)
Off entresto due to hyperkalemia. Continue coreg and eliquis.  Discharged on lasix.  No evidence of significant volume overload.  Breathing better.  Follow.

## 2021-09-17 ENCOUNTER — Ambulatory Visit: Payer: Medicare Other | Attending: Family | Admitting: Family

## 2021-09-17 ENCOUNTER — Encounter: Payer: Self-pay | Admitting: Family

## 2021-09-17 ENCOUNTER — Other Ambulatory Visit: Payer: Self-pay

## 2021-09-17 ENCOUNTER — Other Ambulatory Visit: Payer: Medicare Other

## 2021-09-17 VITALS — BP 148/74 | HR 77 | Resp 20 | Ht 65.0 in | Wt 126.2 lb

## 2021-09-17 DIAGNOSIS — E875 Hyperkalemia: Secondary | ICD-10-CM | POA: Insufficient documentation

## 2021-09-17 DIAGNOSIS — J432 Centrilobular emphysema: Secondary | ICD-10-CM | POA: Diagnosis not present

## 2021-09-17 DIAGNOSIS — I10 Essential (primary) hypertension: Secondary | ICD-10-CM

## 2021-09-17 DIAGNOSIS — G4733 Obstructive sleep apnea (adult) (pediatric): Secondary | ICD-10-CM

## 2021-09-17 DIAGNOSIS — I480 Paroxysmal atrial fibrillation: Secondary | ICD-10-CM

## 2021-09-17 DIAGNOSIS — I5032 Chronic diastolic (congestive) heart failure: Secondary | ICD-10-CM | POA: Insufficient documentation

## 2021-09-17 DIAGNOSIS — Z79899 Other long term (current) drug therapy: Secondary | ICD-10-CM | POA: Insufficient documentation

## 2021-09-17 DIAGNOSIS — I130 Hypertensive heart and chronic kidney disease with heart failure and stage 1 through stage 4 chronic kidney disease, or unspecified chronic kidney disease: Secondary | ICD-10-CM | POA: Insufficient documentation

## 2021-09-17 DIAGNOSIS — F17210 Nicotine dependence, cigarettes, uncomplicated: Secondary | ICD-10-CM | POA: Insufficient documentation

## 2021-09-17 DIAGNOSIS — I4891 Unspecified atrial fibrillation: Secondary | ICD-10-CM | POA: Insufficient documentation

## 2021-09-17 DIAGNOSIS — Z7901 Long term (current) use of anticoagulants: Secondary | ICD-10-CM | POA: Diagnosis not present

## 2021-09-17 DIAGNOSIS — E782 Mixed hyperlipidemia: Secondary | ICD-10-CM | POA: Insufficient documentation

## 2021-09-17 DIAGNOSIS — N2889 Other specified disorders of kidney and ureter: Secondary | ICD-10-CM

## 2021-09-17 DIAGNOSIS — G4730 Sleep apnea, unspecified: Secondary | ICD-10-CM | POA: Diagnosis not present

## 2021-09-17 DIAGNOSIS — J449 Chronic obstructive pulmonary disease, unspecified: Secondary | ICD-10-CM | POA: Insufficient documentation

## 2021-09-17 DIAGNOSIS — N183 Chronic kidney disease, stage 3 unspecified: Secondary | ICD-10-CM | POA: Diagnosis not present

## 2021-09-17 DIAGNOSIS — Z720 Tobacco use: Secondary | ICD-10-CM

## 2021-09-17 DIAGNOSIS — I5042 Chronic combined systolic (congestive) and diastolic (congestive) heart failure: Secondary | ICD-10-CM

## 2021-09-17 DIAGNOSIS — F32A Depression, unspecified: Secondary | ICD-10-CM | POA: Insufficient documentation

## 2021-09-17 DIAGNOSIS — Z7951 Long term (current) use of inhaled steroids: Secondary | ICD-10-CM | POA: Insufficient documentation

## 2021-09-17 DIAGNOSIS — N1832 Chronic kidney disease, stage 3b: Secondary | ICD-10-CM

## 2021-09-17 NOTE — Progress Notes (Signed)
Patient ID: Dawn Sherman, female    DOB: 02/21/1956, 66 y.o.   MRN: [id number]   Ms Prewitt is a 66 y/o female with a history of HTN, CKD, hyperlipidemia, depression, current tobacco use and chronic heart failure.   Echo report from 08/20/21 reviewed and showed EF of 50 to 55%. The left ventricle has low normal function. The left ventricle has no regional wall motion abnormalities. The left ventricular internal cavity size was mildly dilated. Echo report from 02/10/20 reviewed and showed an EF of 30-35% along with mildly elevated PA pressure, moderate MR and mild/moderate TR.   Admitted 08/19/21 due to COPD exacerbation. She was discharged home after 7 days with oxygen, however has not needed to use it since discharge. Her SPO2 is normally 95%. At discharge, her lasix was resumed.   She presents today for a follow- up visit although was last seen in the HF clinic 03/2020. She reports that she has SOB at baseline which is chronic in nature and occasional pedal edema. She denies any CP, palpitations, cough that is not associated with tobacco use, orthopnea, dizziness, or syncope.   She weighs herself daily and states that her weight does not fluctuate > 2 lbs in either direction.   Past Medical History:  Diagnosis Date   B12 deficiency    CHF (congestive heart failure) ([facility])    CKD (chronic kidney disease), stage III ([facility])    Depression    Endometriosis    Hypertension    Mixed hyperlipidemia    Tobacco abuse    Past Surgical History:  Procedure Laterality Date   ABDOMINAL HYSTERECTOMY     BREAST BIOPSY Right 2015   neg-  FIBROADENOMA   TAH/RSO  1999   secondary to bleeding and endometriosis (Dr Vernie Ammons)   Family History  Problem Relation Age of Onset   Hypercholesterolemia Mother    Rheum arthritis Father    Breast cancer Neg Hx    Colon cancer Neg Hx    Social History   Tobacco Use   Smoking status: Every Day    Packs/day: 0.25    Years: 40.00    Pack years: 10.00     Types: Cigarettes   Smokeless tobacco: Never  Substance Use Topics   Alcohol use: Not Currently    Alcohol/week: 0.0 standard drinks    Comment: occasional   Allergies  Allergen Reactions   Amoxicillin    Benicar [Olmesartan]     Talked with patient February 10, 2020, intolerance is unclear, tried several medications around that time and one of them gave her a rash but she is not clear which 1.   Codeine Sulfate    Morphine And Related    Penicillins    Sulfate    Clindamycin/Lincomycin Rash   Prior to Admission medications   Medication Sig Start Date End Date Taking? Authorizing Provider  acetaminophen (TYLENOL) 325 MG tablet Take 650 mg by mouth every 6 (six) hours as needed.   Yes [provider]  apixaban (ELIQUIS) 2.5 MG TABS tablet Take 1 tablet (2.5 mg total) by mouth 2 (two) times daily. 02/21/20  Yes Loel Dubonnet, NP  atorvastatin (LIPITOR) 40 MG tablet Take 1 tablet (40 mg total) by mouth daily. 02/14/20  Yes Rai, Ripudeep K, MD  buPROPion (WELLBUTRIN XL) 150 MG 24 hr tablet One tab po daily 12/09/19  Yes [name], MD  carvedilol (COREG) 12.5 MG tablet Take 1 tablet (12.5 mg total) by mouth 2 (two) times daily  with a meal. 02/13/20  Yes Rai, Ripudeep K, MD  furosemide (LASIX) 20 MG tablet Take 1 tablet (20 mg total) by mouth daily. 02/13/20 02/12/21 Yes Rai, Ripudeep K, MD  loratadine (CLARITIN) 10 MG tablet Take 10 mg by mouth daily.   Yes [provider]  omeprazole (PRILOSEC) 20 MG capsule Take 20 mg by mouth daily.   Yes [provider]  sacubitril-valsartan (ENTRESTO) 24-26 MG Take 1 tablet by mouth 2 (two) times daily. 02/13/20  Yes Strader, [place] M, PA-C  venlafaxine XR (EFFEXOR-XR) 150 MG 24 hr capsule TAKE 1 CAPSULE BY MOUTH EVERY DAY 12/09/19  Yes [name], MD    Review of Systems  Constitutional:  Negative for appetite change and fatigue.  HENT:  Negative for congestion, postnasal drip and sore throat.   Eyes: Negative.    Respiratory:  Positive for shortness of breath (baseline on moderate exertion, related to her COPD). Negative for cough and wheezing.   Cardiovascular:  Negative for chest pain, palpitations and leg swelling.  Gastrointestinal:  Negative for abdominal distention and abdominal pain.  Endocrine: Negative.   Genitourinary: Negative.   Musculoskeletal:  Negative for back pain and neck pain.  Skin: Negative.   Allergic/Immunologic: Negative.   Neurological:  Negative for dizziness and light-headedness.  Hematological:  Negative for adenopathy. Does not bruise/bleed easily.  Psychiatric/Behavioral:  Negative for dysphoric mood and sleep disturbance (sleeps on 1 pillow, has CPAP but does not wear). The patient is not nervous/anxious.    Vitals:   09/17/21 0919  BP: (!) 148/74  Pulse: 77  Resp: 20  SpO2: 99%  Weight: 126 lb 4 oz (57.3 kg)  Height: 5\' 5"  (1.651 m)   Wt Readings from Last 3 Encounters:  09/17/21 126 lb 4 oz (57.3 kg)  09/10/21 127 lb (57.6 kg)  08/19/21 121 lb (54.9 kg)   Lab Results  Component Value Date   CREATININE 1.70 (H) 09/10/2021   CREATININE 1.87 (H) 08/27/2021   CREATININE 1.96 (H) 08/25/2021    Physical Exam Vitals and nursing note reviewed.  Constitutional:      General: She is not in acute distress.    Appearance: Normal appearance. She is not ill-appearing.  HENT:     Head: Normocephalic and atraumatic.  Cardiovascular:     Rate and Rhythm: Normal rate and regular rhythm.     Heart sounds: No murmur heard. Pulmonary:     Effort: Pulmonary effort is normal. No respiratory distress.     Breath sounds: No wheezing or rales.  Abdominal:     General: There is no distension.     Palpations: Abdomen is soft.     Tenderness: There is no abdominal tenderness.  Musculoskeletal:        General: Swelling and deformity (RLE, previous ankle surgery) present. No tenderness.     Cervical back: Normal range of motion and neck supple.     Right lower leg: No  edema.     Left lower leg: Edema (+1) present.  Skin:    General: Skin is warm and dry.  Neurological:     General: No focal deficit present.     Mental Status: She is alert and oriented to person, place, and time.  Psychiatric:        Mood and Affect: Mood normal.        Behavior: Behavior normal.        Thought Content: Thought content normal.    Assessment & Plan:  1: Chronic heart  failure with preserved ejection fraction- - NYHA class II  - euvolemic today - weighing daily; reminded to call for an overnight weight gain of >2 pounds or a weekly weight gain of >5 pounds - not adding salt to her food - saw cardiology Rockey Situ) 02/07/21 - entresto stopped due to hyperkalemia per nephro [name]), will see again 11/28/21 - on coreg, losartan - BNP 08/19/22 was 378.1 - could consider SGLT2 in the future, defer to nephrology for now  2: HTN- - BP mildly elevated 148/78, was similar at hospital discharge - reports taking all of her am meds - saw PCP [name]) 09/10/21 - BMP 09/10/21 reviewed and showed sodium 132, potassium 5, creatinine 1.7 and GFR 31.1  3: Afib - SR today - provided eliquis samples  4: COPD - d/c'd with home O2, however has not used it - breathing is at her baseline - decreasing her tobacco intake  - difficulty affording trelegy, $550/month, although she did pay for it - due to this, she is not taking as prescribed, however she is returning to her PCP today to see if she has any samples  5: CKD- - saw nephrology [name]) 07/30/21 - entresto was stopped secondary to hyperkalemia - albumin to creatinine ratio of 3100 - losartan 25 mg daily for renal protection   6: Tobacco use- - smoking 4 cigarettes/day - trying to completely quit - complete cessation discussed for 3 minutes with her  7: Sleep Apnea - has CPAP but has not used for some time - does not provide clear explanation as why she no longer uses it - advises she sleeps "great"    Medication list  reviewed.   Return in 3 months or sooner for any questions/problems before then.

## 2021-09-17 NOTE — Patient Instructions (Signed)
The [facility] Clinic will be moving around the corner to [address] mid-February. Our phone number will remain the same.  Wear your compression socks, put on in the am and take off before bed time.  Continue to weigh daily.  Continue to decrease your tobacco intake, YOU ARE DOING GREAT WITH THIS!  Return to HF clinic in 3 months or sooner if needed.

## 2021-09-18 ENCOUNTER — Other Ambulatory Visit: Payer: Medicare Other

## 2021-09-20 ENCOUNTER — Telehealth: Payer: Medicare Other

## 2021-09-20 ENCOUNTER — Telehealth: Payer: Self-pay | Admitting: Pharmacist

## 2021-09-20 NOTE — Telephone Encounter (Signed)
°  Chronic Care Management   Note  09/20/2021 Name: Dawn Sherman MRN: [id number] DOB: 1956/08/13   Attempted to contact patient for scheduled appointment for medication management support. Left HIPAA compliant message for patient to return my call at their convenience.    Plan: - If I do not hear back from the patient by end of business today, will collaborate with Care Guide to outreach to schedule follow up with me   Catie Darnelle Maffucci, PharmD, [place], [place] Pharmacist [employer] at [employer] [phone]

## 2021-09-21 ENCOUNTER — Other Ambulatory Visit: Payer: Self-pay

## 2021-09-21 ENCOUNTER — Other Ambulatory Visit (INDEPENDENT_AMBULATORY_CARE_PROVIDER_SITE_OTHER): Payer: Medicare Other

## 2021-09-21 DIAGNOSIS — E875 Hyperkalemia: Secondary | ICD-10-CM | POA: Diagnosis not present

## 2021-09-21 LAB — BASIC METABOLIC PANEL
BUN: 33 mg/dL — ABNORMAL HIGH (ref 6–23)
CO2: 27 mEq/L (ref 19–32)
Calcium: 8.8 mg/dL (ref 8.4–10.5)
Chloride: 97 mEq/L (ref 96–112)
Creatinine, Ser: 2.14 mg/dL — ABNORMAL HIGH (ref 0.40–1.20)
GFR: 23.6 mL/min — ABNORMAL LOW (ref >60.00)
Glucose, Bld: 106 mg/dL — ABNORMAL HIGH (ref 70–99)
Potassium: 4.2 mEq/L (ref 3.5–5.1)
Sodium: 133 mEq/L — ABNORMAL LOW (ref 135–145)

## 2021-09-24 ENCOUNTER — Telehealth: Payer: Self-pay

## 2021-09-24 ENCOUNTER — Other Ambulatory Visit: Payer: Self-pay

## 2021-09-24 DIAGNOSIS — R944 Abnormal results of kidney function studies: Secondary | ICD-10-CM

## 2021-09-24 NOTE — Telephone Encounter (Signed)
LMTCB regarding labs 

## 2021-09-24 NOTE — Chronic Care Management (AMB) (Signed)
°  Care Management   Note  09/24/2021 Name: Dawn Sherman MRN: [id number] DOB: 1956/03/20  Dawn Sherman is a 66 y.o. year old female who is a primary care patient of [name], MD and is actively engaged with the care management team. I reached out to Dawn Sherman by phone today to assist with re-scheduling a follow up visit with the Pharmacist  Follow up plan: Unsuccessful telephone outreach attempt made. A HIPAA compliant phone message was left for the patient providing contact information and requesting a return call.  The care management team will reach out to the patient again over the next 7 days.  If patient returns call to provider office, please advise to call [facility]  at [place], [place], [place], [place] [address] Direct Dial: [phone] [email or web address] Website: [email or web address]

## 2021-09-25 NOTE — Telephone Encounter (Signed)
**Note Dawn-Identified via Obfuscation** Medication Samples have been labeled and logged for the patient.  Drug name: Trelegy       Strength: 100/62/25 mcg        Qty: 3  LOT: [id number]  Exp.Date: 04/2022  Dosing instructions: Inhale 1 puff once daily  The patient has been instructed regarding the correct time, dose, and frequency of taking this medication, including desired effects and most common side effects.   Dawn Sherman 4:02 PM 09/25/2021

## 2021-09-25 NOTE — Telephone Encounter (Signed)
Pt has been scheduled states that she needs assistance with Trelegy

## 2021-09-26 ENCOUNTER — Inpatient Hospital Stay: Payer: Medicare Other | Admitting: Internal Medicine

## 2021-09-26 NOTE — Telephone Encounter (Signed)
Pt has been notified.

## 2021-09-26 NOTE — Chronic Care Management (AMB) (Signed)
°  Care Management   Note  09/26/2021 Name: Dawn Sherman MRN: [id number] DOB: 18-Dec-1955  Dawn Sherman is a 66 y.o. year old female who is a primary care patient of [name], MD and is actively engaged with the care management team. I reached out to Dawn Sherman by phone today to assist with re-scheduling a follow up visit with the Pharmacist  Follow up plan: Telephone appointment with care management team member scheduled for:11/01/2021  Noreene Larsson, [place], [place], [place] [address] Direct Dial: [phone] [email or web address] Website: [email or web address]

## 2021-10-03 ENCOUNTER — Other Ambulatory Visit
Admission: RE | Admit: 2021-10-03 | Discharge: 2021-10-03 | Disposition: A | Discharge status: Home / Self Care | Payer: Medicare Other | Attending: Internal Medicine | Admitting: Internal Medicine

## 2021-10-03 ENCOUNTER — Other Ambulatory Visit: Payer: Self-pay

## 2021-10-03 DIAGNOSIS — R944 Abnormal results of kidney function studies: Secondary | ICD-10-CM | POA: Insufficient documentation

## 2021-10-03 DIAGNOSIS — E875 Hyperkalemia: Secondary | ICD-10-CM

## 2021-10-03 LAB — BASIC METABOLIC PANEL
Anion gap: 7 (ref 5–15)
BUN: 23 mg/dL (ref 8–23)
CO2: 25 mmol/L (ref 22–32)
Calcium: 8.5 mg/dL — ABNORMAL LOW (ref 8.9–10.3)
Chloride: 102 mmol/L (ref 98–111)
Creatinine, Ser: 1.73 mg/dL — ABNORMAL HIGH (ref 0.44–1.00)
GFR, Estimated: 32 mL/min — ABNORMAL LOW (ref >60)
Glucose, Bld: 95 mg/dL (ref 70–99)
Potassium: 5.3 mmol/L — ABNORMAL HIGH (ref 3.5–5.1)
Sodium: 134 mmol/L — ABNORMAL LOW (ref 135–145)

## 2021-10-05 ENCOUNTER — Other Ambulatory Visit
Admission: RE | Admit: 2021-10-05 | Discharge: 2021-10-05 | Disposition: A | Discharge status: Home / Self Care | Payer: Medicare Other | Attending: Internal Medicine | Admitting: Internal Medicine

## 2021-10-05 DIAGNOSIS — E875 Hyperkalemia: Secondary | ICD-10-CM | POA: Insufficient documentation

## 2021-10-05 LAB — POTASSIUM: Potassium: 5.4 mmol/L — ABNORMAL HIGH (ref 3.5–5.1)

## 2021-10-08 ENCOUNTER — Other Ambulatory Visit: Payer: Self-pay

## 2021-10-08 ENCOUNTER — Telehealth: Payer: Self-pay

## 2021-10-08 DIAGNOSIS — E875 Hyperkalemia: Secondary | ICD-10-CM

## 2021-10-08 MED ORDER — FUROSEMIDE 40 MG PO TABS: 40.0000 mg | ORAL_TABLET | ORAL | 1 refills | Status: DC

## 2021-10-08 NOTE — Telephone Encounter (Addendum)
LMTCB regarding labs   ----- Message from Dawn Pheasant, MD sent at 10/07/2021  9:24 PM EST ----- Notify Dawn Sherman that her potassium remains slightly elevated.  I discussed with nephrology.  Remain off losartan.  Confirm she is currently taking lasix qod.  If so, have her increase lasix back to taking daily.  Will follow.  Recheck met b in 7-10 days.

## 2021-10-08 NOTE — Telephone Encounter (Signed)
Pt aware, rx sent in. Pt will start lasix qod per Dr [name] since she has been off more than a week. BMP ordered

## 2021-10-08 NOTE — Telephone Encounter (Addendum)
Pt returned call. Advise pt of below note. Pt stated she understand. Pt stated that she ran out of medication (furosemide (LASIX) 40 MG tablet). Pt stated she hasn't taking medication in about a week. Pt requesting refill on medication. Advise Pt recheck met b in a week. No labs in system. Pt requesting callback at script being sent over.

## 2021-10-17 ENCOUNTER — Other Ambulatory Visit (INDEPENDENT_AMBULATORY_CARE_PROVIDER_SITE_OTHER): Payer: Medicare Other

## 2021-10-17 ENCOUNTER — Other Ambulatory Visit: Payer: Self-pay

## 2021-10-17 DIAGNOSIS — E875 Hyperkalemia: Secondary | ICD-10-CM | POA: Diagnosis not present

## 2021-10-17 LAB — BASIC METABOLIC PANEL
BUN: 32 mg/dL — ABNORMAL HIGH (ref 6–23)
CO2: 29 mEq/L (ref 19–32)
Calcium: 9.1 mg/dL (ref 8.4–10.5)
Chloride: 101 mEq/L (ref 96–112)
Creatinine, Ser: 1.81 mg/dL — ABNORMAL HIGH (ref 0.40–1.20)
GFR: 28.83 mL/min — ABNORMAL LOW (ref >60.00)
Glucose, Bld: 86 mg/dL (ref 70–99)
Potassium: 4.9 mEq/L (ref 3.5–5.1)
Sodium: 134 mEq/L — ABNORMAL LOW (ref 135–145)

## 2021-10-25 ENCOUNTER — Other Ambulatory Visit: Payer: Self-pay

## 2021-10-25 ENCOUNTER — Ambulatory Visit (INDEPENDENT_AMBULATORY_CARE_PROVIDER_SITE_OTHER): Payer: Medicare Other | Admitting: Internal Medicine

## 2021-10-25 ENCOUNTER — Encounter: Payer: Self-pay | Admitting: Internal Medicine

## 2021-10-25 DIAGNOSIS — J432 Centrilobular emphysema: Secondary | ICD-10-CM

## 2021-10-25 DIAGNOSIS — I5042 Chronic combined systolic (congestive) and diastolic (congestive) heart failure: Secondary | ICD-10-CM | POA: Diagnosis not present

## 2021-10-25 DIAGNOSIS — I480 Paroxysmal atrial fibrillation: Secondary | ICD-10-CM | POA: Diagnosis not present

## 2021-10-25 DIAGNOSIS — M7989 Other specified soft tissue disorders: Secondary | ICD-10-CM

## 2021-10-25 DIAGNOSIS — Z1211 Encounter for screening for malignant neoplasm of colon: Secondary | ICD-10-CM

## 2021-10-25 DIAGNOSIS — N1832 Chronic kidney disease, stage 3b: Secondary | ICD-10-CM

## 2021-10-25 DIAGNOSIS — I10 Essential (primary) hypertension: Secondary | ICD-10-CM | POA: Diagnosis not present

## 2021-10-25 DIAGNOSIS — E7800 Pure hypercholesterolemia, unspecified: Secondary | ICD-10-CM

## 2021-10-25 DIAGNOSIS — K219 Gastro-esophageal reflux disease without esophagitis: Secondary | ICD-10-CM

## 2021-10-25 MED ORDER — HYDRALAZINE HCL 10 MG PO TABS: 10.0000 mg | ORAL_TABLET | Freq: Three times a day (TID) | ORAL | 2 refills | Status: DC

## 2021-10-25 MED ORDER — AMLODIPINE BESYLATE 5 MG PO TABS: 5.0000 mg | ORAL_TABLET | Freq: Every day | ORAL | 1 refills | Status: DC

## 2021-10-25 NOTE — Progress Notes (Signed)
Patient ID: Dawn Sherman, female   DOB: 1955-12-13, 66 y.o.   MRN: [id number]   Subjective:    Patient ID: Dawn Sherman, female    DOB: Apr 07, 1956, 66 y.o.   MRN: [id number]  This visit occurred during the SARS-CoV-2 public health emergency.  Safety protocols were in place, including screening questions prior to the visit, additional usage of staff PPE, and extensive cleaning of exam room while observing appropriate contact time as indicated for disinfecting solutions.   Patient here for a scheduled follow up.   HPI Hospitalized 08/19/21 - 08/27/21 - acute hypoxic respiratory failure secondary to copd exacerbation.  Was discharged on oxygen. Was started on trelegy and nebulizers.  Continued carvedilol and lasix.  Off losartan due to hyperkalemia.  Here to follow up regarding her blood pressure and foot/ankle swelling.  States both legs are swelling - left > right.  Looks normal in am.  Swelling involves the feet and ankles.  No redness. No calf tenderness.  No swelling extending up the legs.  No increased sob.  No cough or congestion.  No chest pain. Watching salt intake.  Compression hose worsen swelling. Weight stable at home - 123 pounds.    Past Medical History:  Diagnosis Date   B12 deficiency    CHF (congestive heart failure) ([facility])    CKD (chronic kidney disease), stage III ([facility])    Depression    Endometriosis    Hypertension    Mixed hyperlipidemia    Tobacco abuse    Past Surgical History:  Procedure Laterality Date   ABDOMINAL HYSTERECTOMY     BREAST BIOPSY Right 2015   neg-  FIBROADENOMA   TAH/RSO  1999   secondary to bleeding and endometriosis (Dr Vernie Ammons)   Family History  Problem Relation Age of Onset   Hypercholesterolemia Mother    Rheum arthritis Father    Breast cancer Neg Hx    Colon cancer Neg Hx    Social History   Socioeconomic History   Marital status: Married    Spouse name: Not on file   Number of children: Not on file   Years of education: Not on  file   Highest education level: Not on file  Occupational History   Not on file  Tobacco Use   Smoking status: Every Day    Packs/day: 0.25    Years: 40.00    Pack years: 10.00    Types: Cigarettes   Smokeless tobacco: Never  Vaping Use   Vaping Use: Never used  Substance and Sexual Activity   Alcohol use: Not Currently    Alcohol/week: 0.0 standard drinks    Comment: occasional   Drug use: No   Sexual activity: Not Currently  Other Topics Concern   Not on file  Social History Narrative   Lives in [place] with her husband.  She is retired from [employer].  She does not routinely exercise.   Social Determinants of Health   Financial Resource Strain: Medium Risk   Difficulty of Paying Living Expenses: Somewhat hard  Food Insecurity: Not on file  Transportation Needs: Not on file  Physical Activity: Not on file  Stress: Not on file  Social Connections: Not on file     Review of Systems  Constitutional:  Negative for appetite change and unexpected weight change.  HENT:  Negative for congestion and sinus pressure.   Respiratory:  Negative for cough and chest tightness.        No increased sob.  Cardiovascular:  Negative for chest pain and palpitations.       Pedal and ankle swelling as outlined.   Gastrointestinal:  Negative for abdominal pain, diarrhea, nausea and vomiting.  Genitourinary:  Negative for difficulty urinating and dysuria.  Musculoskeletal:  Negative for joint swelling and myalgias.  Skin:  Negative for color change and rash.  Neurological:  Negative for dizziness, light-headedness and headaches.  Psychiatric/Behavioral:  Negative for agitation and dysphoric mood.       Objective:     BP 140/82 (BP Location: Left Arm, Patient Position: Sitting, Cuff Size: Small)    Pulse 90    Temp 98 F (36.7 C) (Oral)    Resp 12    Ht 5\' 5"  (1.651 m)    Wt 128 lb 3.2 oz (58.2 kg)    SpO2 91%    BMI 21.33 kg/m  Wt Readings from Last 3 Encounters:   10/25/21 128 lb 3.2 oz (58.2 kg)  09/17/21 126 lb 4 oz (57.3 kg)  09/10/21 127 lb (57.6 kg)    Physical Exam Vitals reviewed.  Constitutional:      General: She is not in acute distress.    Appearance: Normal appearance.  HENT:     Head: Normocephalic and atraumatic.     Right Ear: External ear normal.     Left Ear: External ear normal.  Eyes:     General: No scleral icterus.       Right eye: No discharge.        Left eye: No discharge.     Conjunctiva/sclera: Conjunctivae normal.  Neck:     Thyroid: No thyromegaly.  Cardiovascular:     Rate and Rhythm: Normal rate and regular rhythm.  Pulmonary:     Effort: No respiratory distress.     Breath sounds: Normal breath sounds. No wheezing.  Abdominal:     General: Bowel sounds are normal.     Palpations: Abdomen is soft.     Tenderness: There is no abdominal tenderness.  Musculoskeletal:     Cervical back: Neck supple. No tenderness.     Comments: Increased pedal and ankle swelling - left > right.  No increased erythema.   Lymphadenopathy:     Cervical: No cervical adenopathy.  Skin:    Findings: No erythema or rash.  Neurological:     Mental Status: She is alert.  Psychiatric:        Mood and Affect: Mood normal.        Behavior: Behavior normal.     Outpatient Encounter Medications as of 10/25/2021  Medication Sig   amLODipine (NORVASC) 5 MG tablet Take 1 tablet (5 mg total) by mouth daily.   hydrALAZINE (APRESOLINE) 10 MG tablet Take 1 tablet (10 mg total) by mouth 3 (three) times daily.   acetaminophen (TYLENOL) 325 MG tablet Take 650 mg by mouth every 6 (six) hours as needed.   atorvastatin (LIPITOR) 40 MG tablet TAKE 1 TABLET BY MOUTH EVERY DAY   buPROPion (WELLBUTRIN XL) 150 MG 24 hr tablet TAKE 1 TABLET BY MOUTH EVERY DAY   carvedilol (COREG) 25 MG tablet TAKE 1 TABLET BY MOUTH TWICE A DAY   ELIQUIS 2.5 MG TABS tablet TAKE 1 TABLET BY MOUTH TWICE A DAY   Fluticasone-Umeclidin-Vilant (TRELEGY ELLIPTA)  100-62.5-25 MCG/ACT AEPB Inhale 1 puff into the lungs daily.   furosemide (LASIX) 40 MG tablet Take 1 tablet (40 mg total) by mouth every other day.   ipratropium-albuterol (DUONEB) 0.5-2.5 (3) MG/3ML SOLN Take  3 mLs by nebulization every 4 (four) hours as needed (Shortness of breath/wheeze).   loratadine (CLARITIN) 10 MG tablet Take 10 mg by mouth daily.   omeprazole (PRILOSEC) 20 MG capsule Take 20 mg by mouth daily.   venlafaxine XR (EFFEXOR-XR) 150 MG 24 hr capsule TAKE 1 CAPSULE BY MOUTH EVERY DAY   [DISCONTINUED] amLODipine (NORVASC) 10 MG tablet TAKE 1 TABLET BY MOUTH EVERY DAY   No facility-administered encounter medications on file as of 10/25/2021.     Lab Results  Component Value Date   WBC 9.5 09/10/2021   HGB 11.9 (L) 09/10/2021   HCT 38.1 09/10/2021   PLT 204.0 09/10/2021   GLUCOSE 86 10/17/2021   CHOL 180 02/23/2021   TRIG 95.0 02/23/2021   HDL 76.10 02/23/2021   LDLDIRECT 53.3 10/14/2012   LDLCALC 85 02/23/2021   ALT 11 08/20/2021   AST 13 (L) 08/20/2021   NA 134 (L) 10/17/2021   K 4.9 10/17/2021   CL 101 10/17/2021   CREATININE 1.81 (H) 10/17/2021   BUN 32 (H) 10/17/2021   CO2 29 10/17/2021   TSH 2.90 02/23/2021   INR 1.0 12/13/2013   HGBA1C 6.2 06/26/2021       Assessment & Plan:   Problem List Items Addressed This Visit     Chronic combined systolic and diastolic CHF (congestive heart failure) ([place])    Off entresto due to hyperkalemia. Continue coreg and eliquis.  Discharged on lasix.  Taking qod.  Saw nephrology.  Was placed on losartan.  This had to be stopped secondary to hyperkalemia.  On 10mg  amlodipine now.  Feel this is contributing to pedal and ankle swelling.  Will decrease amlodipine to 5mg  q day.  Unable to use ace inhibitor or ARB.  Already on coreg. Will start hydralazine 10mg  tid.  Increase dose pending blood pressure response.  Follow pressures.  Follow metabolic panel.  Breathing better.  Follow. Continue to monitor weights.       Relevant  Medications   amLODipine (NORVASC) 5 MG tablet   hydrALAZINE (APRESOLINE) 10 MG tablet   Chronic obstructive pulmonary disease ([facility])    Recently admitted with copd exacerbation.  Breathing better now.  Continue trelegy and nebs.  F/u with pulmonary as recommended.        CKD (chronic kidney disease) stage 3, GFR 30-59 ml/min ([facility])    Continue to avoid antiinflammatories.  Has been evaluated by nephrology.  Discharged on lasix.  Losartan stopped secondary to hyperkalemia.  Follow metabolic panel.        Encounter for screening colonoscopy    Colonoscopy 12/2013.  Recommended f/u in 10 years.       GERD (gastroesophageal reflux disease)    No upper symptoms reported.  On prilosec.       Hypercholesterolemia    Continue lipitor.  Low cholesterol diet and exercise.  Follow lipid panel and liver function tests.        Relevant Medications   amLODipine (NORVASC) 5 MG tablet   hydrALAZINE (APRESOLINE) 10 MG tablet   Hypertension    Off entresto due to hyperkalemia.  Continue coreg and eliquis.  Saw nephrology.  Was placed on losartan.  This had to be stopped secondary to hyperkalemia.  On 10mg  amlodipine now.  Feel this is contributing to pedal and ankle swelling.  Will decrease amlodipine to 5mg  q day.  Unable to use ace inhibitor or ARB.  Already on coreg.  Will start hydralazine 10mg  tid.  Increase dose pending blood pressure  response.  Follow pressures.  Follow metabolic panel.       Relevant Medications   amLODipine (NORVASC) 5 MG tablet   hydrALAZINE (APRESOLINE) 10 MG tablet   Paroxysmal A-fib ([facility])    Documented paroxysmal afib.  Continue eliquis and coreg.  No increased heart rate or palpitations. Appears to be in SR.  Follow.       Relevant Medications   amLODipine (NORVASC) 5 MG tablet   hydrALAZINE (APRESOLINE) 10 MG tablet   Swelling of lower extremity    Increased pedal, ankle, lower leg swelling as outlined.  No increased sob.  Breathing better.  Continues on lasix  qod.  Unable to wear compression hose.  Feels worsened.  Monitoring weight and sodium intake.  Adjust amlodipine dose as outlined - 10mg  down to 5mg .  Given persistent swelling, will have [facility] evaluate.        Relevant Orders   Ambulatory referral to Vascular Surgery     Dawn Pheasant, MD

## 2021-10-25 NOTE — Patient Instructions (Signed)
Decrease amlodipine to 5mg  per day.  I sent in a new prescription for 5mg .  You can take 1/2 of the 10mg  until you finish and then get the 5mg  tablets refills.   ? ?Start hydralazine 10mg  - three times per day.   ?

## 2021-10-26 ENCOUNTER — Telehealth: Payer: Self-pay

## 2021-10-26 NOTE — Chronic Care Management (AMB) (Signed)
?  Care Management  ? ?Note ? ?10/26/2021 ?Name: Dawn Sherman MRN: [id number] DOB: April 06, 1956 ? ?Dawn Sherman is a 66 y.o. year old female who is a primary care patient of [name], MD and is actively engaged with the care management team. I reached out to [name] by phone today to assist with re-scheduling a follow up visit with the Pharmacist ? ?Follow up plan: ?Telephone appointment with care management team member scheduled for:01/04/2022 ? ? ?Noreene Larsson, RMA ?Care Guide, Embedded Care Coordination ?[facility]  ?[place], [place] [address] ?Direct Dial: [phone] ?[email or web address] ?Website: [email or web address]  ? ?

## 2021-10-26 NOTE — Chronic Care Management (AMB) (Signed)
?  Care Management  ? ?Note ? ?10/26/2021 ?Name: YUI MULVANEY MRN: [id number] DOB: [birth year] ? ?ROMANDA TURRUBIATES is a 66 y.o. year old female who is a primary care patient of [name], MD and is actively engaged with the care management team. I reached out to [name] by phone today to assist with re-scheduling a follow up visit with the Pharmacist ? ?Follow up plan: ?Unsuccessful telephone outreach attempt made. A HIPAA compliant phone message was left for the patient providing contact information and requesting a return call.  ?The care management team will reach out to the patient again over the next 7 days.  ?If patient returns call to provider office, please advise to call [facility]  at [phone] ? ?Noreene Larsson, RMA ?Care Guide, Embedded Care Coordination ?[facility]  ?[place], [place] [address] ?Direct Dial: [phone] ?[email or web address] ?[email or web address]: [email or web address]  ? ?

## 2021-10-28 ENCOUNTER — Encounter: Payer: Self-pay | Admitting: Internal Medicine

## 2021-10-28 DIAGNOSIS — M7989 Other specified soft tissue disorders: Secondary | ICD-10-CM | POA: Insufficient documentation

## 2021-10-28 NOTE — Assessment & Plan Note (Signed)
Off entresto due to hyperkalemia.  Continue coreg and eliquis.  Saw nephrology.  Was placed on losartan.  This had to be stopped secondary to hyperkalemia.  On 10mg  amlodipine now.  Feel this is contributing to pedal and ankle swelling.  Will decrease amlodipine to 5mg  q day.  Unable to use ace inhibitor or ARB.  Already on coreg.  Will start hydralazine 10mg  tid.  Increase dose pending blood pressure response.  Follow pressures.  Follow metabolic panel.  ?

## 2021-10-28 NOTE — Assessment & Plan Note (Signed)
Continue to avoid antiinflammatories.  Has been evaluated by nephrology.  Discharged on lasix.  Losartan stopped secondary to hyperkalemia.  Follow metabolic panel.   ?

## 2021-10-28 NOTE — Assessment & Plan Note (Addendum)
Off entresto due to hyperkalemia. Continue coreg and eliquis.  Discharged on lasix.  Taking qod.  Saw nephrology.  Was placed on losartan.  This had to be stopped secondary to hyperkalemia.  On 10mg  amlodipine now.  Feel this is contributing to pedal and ankle swelling.  Will decrease amlodipine to 5mg  q day.  Unable to use ace inhibitor or ARB.  Already on coreg. Will start hydralazine 10mg  tid.  Increase dose pending blood pressure response.  Follow pressures.  Follow metabolic panel.  Breathing better.  Follow. Continue to monitor weights.  ?

## 2021-10-28 NOTE — Assessment & Plan Note (Signed)
Recently admitted with copd exacerbation.  Breathing better now.  Continue trelegy and nebs.  F/u with pulmonary as recommended.   ?

## 2021-10-28 NOTE — Assessment & Plan Note (Signed)
Colonoscopy 12/2013.  Recommended f/u in 10 years.  ?

## 2021-10-28 NOTE — Assessment & Plan Note (Signed)
Increased pedal, ankle, lower leg swelling as outlined.  No increased sob.  Breathing better.  Continues on lasix qod.  Unable to wear compression hose.  Feels worsened.  Monitoring weight and sodium intake.  Adjust amlodipine dose as outlined - 10mg  down to 5mg .  Given persistent swelling, will have [facility] evaluate.   ?

## 2021-10-28 NOTE — Assessment & Plan Note (Signed)
Documented paroxysmal afib.  Continue eliquis and coreg.  No increased heart rate or palpitations. Appears to be in SR.  Follow.  ?

## 2021-10-28 NOTE — Assessment & Plan Note (Signed)
Continue lipitor.  Low cholesterol diet and exercise.  Follow lipid panel and liver function tests.   

## 2021-10-28 NOTE — Assessment & Plan Note (Signed)
No upper symptoms reported. On prilosec.  

## 2021-10-31 ENCOUNTER — Other Ambulatory Visit: Payer: Self-pay | Admitting: Internal Medicine

## 2021-11-01 ENCOUNTER — Telehealth: Payer: Medicare Other

## 2021-11-23 ENCOUNTER — Ambulatory Visit (INDEPENDENT_AMBULATORY_CARE_PROVIDER_SITE_OTHER): Payer: Medicare Other | Admitting: Internal Medicine

## 2021-11-23 ENCOUNTER — Encounter: Payer: Self-pay | Admitting: Internal Medicine

## 2021-11-23 VITALS — BP 130/76 | HR 76 | Temp 98.0°F | Resp 16 | Ht 65.0 in | Wt 124.0 lb

## 2021-11-23 DIAGNOSIS — I480 Paroxysmal atrial fibrillation: Secondary | ICD-10-CM

## 2021-11-23 DIAGNOSIS — E7800 Pure hypercholesterolemia, unspecified: Secondary | ICD-10-CM | POA: Diagnosis not present

## 2021-11-23 DIAGNOSIS — J432 Centrilobular emphysema: Secondary | ICD-10-CM

## 2021-11-23 DIAGNOSIS — N1832 Chronic kidney disease, stage 3b: Secondary | ICD-10-CM

## 2021-11-23 DIAGNOSIS — F320 Major depressive disorder, single episode, mild: Secondary | ICD-10-CM

## 2021-11-23 DIAGNOSIS — R739 Hyperglycemia, unspecified: Secondary | ICD-10-CM

## 2021-11-23 DIAGNOSIS — I5042 Chronic combined systolic (congestive) and diastolic (congestive) heart failure: Secondary | ICD-10-CM | POA: Diagnosis not present

## 2021-11-23 DIAGNOSIS — K219 Gastro-esophageal reflux disease without esophagitis: Secondary | ICD-10-CM

## 2021-11-23 DIAGNOSIS — I10 Essential (primary) hypertension: Secondary | ICD-10-CM | POA: Diagnosis not present

## 2021-11-23 NOTE — Progress Notes (Signed)
Patient ID: Dawn Sherman, female   DOB: Sep 22, 1955, 66 y.o.   MRN: [id number] ? ? ?Subjective:  ? ? Patient ID: Dawn Sherman, female    DOB: 1956/07/18, 66 y.o.   MRN: [id number] ? ?This visit occurred during the SARS-CoV-2 public health emergency.  Safety protocols were in place, including screening questions prior to the visit, additional usage of staff PPE, and extensive cleaning of exam room while observing appropriate contact time as indicated for disinfecting solutions.  ? ?Patient here for a scheduled follow up.  ? ?Chief Complaint  ?Patient presents with  ? Hypertension  ? .  ? ?Hypertension ?Pertinent negatives include no chest pain, headaches, palpitations or shortness of breath.  ?Blood pressure was elevated last check.  Started on hydralazine.  Was having lower extremity swelling.  Amlodipine adjusted.  She saw nephrology 11/20/21.  Farxiga added.  She is doing well.  Blood pressure is doing better.  She feels good.  No chest pain or sob reported.  Eating.  Good appetite.  No nausea or vomiting.  No bowel change.  Weighs herself daily.  Weight at home - 120-123.   ? ? ?Past Medical History:  ?Diagnosis Date  ? B12 deficiency   ? CHF (congestive heart failure) ([place])   ? CKD (chronic kidney disease), stage III ([place])   ? Depression   ? Endometriosis   ? Hypertension   ? Mixed hyperlipidemia   ? Tobacco abuse   ? ?Past Surgical History:  ?Procedure Laterality Date  ? ABDOMINAL HYSTERECTOMY    ? BREAST BIOPSY Right 2015  ? neg-  FIBROADENOMA  ? TAH/RSO  1999  ? secondary to bleeding and endometriosis (Dr Vernie Ammons)  ? ?Family History  ?Problem Relation Age of Onset  ? Hypercholesterolemia Mother   ? Rheum arthritis Father   ? Breast cancer Neg Hx   ? Colon cancer Neg Hx   ? ?Social History  ? ?Socioeconomic History  ? Marital status: Married  ?  Spouse name: Not on file  ? Number of children: Not on file  ? Years of education: Not on file  ? Highest education level: Not on file  ?Occupational History  ? Not on  file  ?Tobacco Use  ? Smoking status: Every Day  ?  Packs/day: 0.25  ?  Years: 40.00  ?  Pack years: 10.00  ?  Types: Cigarettes  ? Smokeless tobacco: Never  ?Vaping Use  ? Vaping Use: Never used  ?Substance and Sexual Activity  ? Alcohol use: Not Currently  ?  Alcohol/week: 0.0 standard drinks  ?  Comment: occasional  ? Drug use: No  ? Sexual activity: Not Currently  ?Other Topics Concern  ? Not on file  ?Social History Narrative  ? Lives in [place] with her husband.  She is retired from [employer].  She does not routinely exercise.  ? ?Social Determinants of Health  ? ?Financial Resource Strain: Medium Risk  ? Difficulty of Paying Living Expenses: Somewhat hard  ?Food Insecurity: Not on file  ?Transportation Needs: Not on file  ?Physical Activity: Not on file  ?Stress: Not on file  ?Social Connections: Not on file  ? ? ? ?Review of Systems  ?Constitutional:  Negative for appetite change and unexpected weight change.  ?HENT:  Negative for congestion and sinus pressure.   ?Respiratory:  Negative for cough, chest tightness and shortness of breath.   ?Cardiovascular:  Negative for chest pain, palpitations and leg swelling.  ?Gastrointestinal:  Negative  for abdominal pain, diarrhea, nausea and vomiting.  ?Genitourinary:  Negative for difficulty urinating and dysuria.  ?Musculoskeletal:  Negative for joint swelling and myalgias.  ?Skin:  Negative for color change and rash.  ?Neurological:  Negative for dizziness, light-headedness and headaches.  ?Psychiatric/Behavioral:  Negative for agitation and dysphoric mood.   ? ?   ?Objective:  ?  ? ?BP 130/76   Pulse 76   Temp 98 ?F (36.7 ?C)   Resp 16   Ht 5\' 5"  (1.651 m)   Wt 124 lb (56.2 kg)   SpO2 98%   BMI 20.63 kg/m?  ?Wt Readings from Last 3 Encounters:  ?11/23/21 124 lb (56.2 kg)  ?10/25/21 128 lb 3.2 oz (58.2 kg)  ?09/17/21 126 lb 4 oz (57.3 kg)  ? ? ?Physical Exam ?Vitals reviewed.  ?Constitutional:   ?   General: She is not in acute distress. ?    Appearance: Normal appearance.  ?HENT:  ?   Head: Normocephalic and atraumatic.  ?   Right Ear: External ear normal.  ?   Left Ear: External ear normal.  ?Eyes:  ?   General: No scleral icterus.    ?   Right eye: No discharge.     ?   Left eye: No discharge.  ?   Conjunctiva/sclera: Conjunctivae normal.  ?Neck:  ?   Thyroid: No thyromegaly.  ?Cardiovascular:  ?   Rate and Rhythm: Normal rate and regular rhythm.  ?Pulmonary:  ?   Effort: No respiratory distress.  ?   Breath sounds: Normal breath sounds. No wheezing.  ?Abdominal:  ?   General: Bowel sounds are normal.  ?   Palpations: Abdomen is soft.  ?   Tenderness: There is no abdominal tenderness.  ?Musculoskeletal:     ?   General: No swelling or tenderness.  ?   Cervical back: Neck supple. No tenderness.  ?Lymphadenopathy:  ?   Cervical: No cervical adenopathy.  ?Skin: ?   Findings: No erythema or rash.  ?Neurological:  ?   Mental Status: She is alert.  ?Psychiatric:     ?   Mood and Affect: Mood normal.     ?   Behavior: Behavior normal.  ? ? ? ?Outpatient Encounter Medications as of 11/23/2021  ?Medication Sig  ? acetaminophen (TYLENOL) 325 MG tablet Take 650 mg by mouth every 6 (six) hours as needed.  ? amLODipine (NORVASC) 5 MG tablet Take 1 tablet (5 mg total) by mouth daily.  ? atorvastatin (LIPITOR) 40 MG tablet TAKE 1 TABLET BY MOUTH EVERY DAY  ? buPROPion (WELLBUTRIN XL) 150 MG 24 hr tablet TAKE 1 TABLET BY MOUTH EVERY DAY  ? carvedilol (COREG) 25 MG tablet TAKE 1 TABLET BY MOUTH TWICE A DAY  ? ELIQUIS 2.5 MG TABS tablet TAKE 1 TABLET BY MOUTH TWICE A DAY  ? FARXIGA 10 MG TABS tablet Take 10 mg by mouth daily.  ? Fluticasone-Umeclidin-Vilant (TRELEGY ELLIPTA) 100-62.5-25 MCG/ACT AEPB Inhale 1 puff into the lungs daily.  ? furosemide (LASIX) 40 MG tablet TAKE 1 TABLET BY MOUTH EVERY OTHER DAY  ? hydrALAZINE (APRESOLINE) 10 MG tablet Take 1 tablet (10 mg total) by mouth 3 (three) times daily.  ? ipratropium-albuterol (DUONEB) 0.5-2.5 (3) MG/3ML SOLN Take 3  mLs by nebulization every 4 (four) hours as needed (Shortness of breath/wheeze).  ? loratadine (CLARITIN) 10 MG tablet Take 10 mg by mouth daily.  ? omeprazole (PRILOSEC) 20 MG capsule Take 20 mg by mouth daily.  ? venlafaxine  XR (EFFEXOR-XR) 150 MG 24 hr capsule TAKE 1 CAPSULE BY MOUTH EVERY DAY  ? ?No facility-administered encounter medications on file as of 11/23/2021.  ?  ? ?Lab Results  ?Component Value Date  ? WBC 9.5 09/10/2021  ? HGB 11.9 (L) 09/10/2021  ? HCT 38.1 09/10/2021  ? PLT 204.0 09/10/2021  ? GLUCOSE 86 10/17/2021  ? CHOL 180 02/23/2021  ? TRIG 95.0 02/23/2021  ? HDL 76.10 02/23/2021  ? LDLDIRECT 53.3 10/14/2012  ? [place] 85 02/23/2021  ? ALT 11 08/20/2021  ? AST 13 (L) 08/20/2021  ? NA 134 (L) 10/17/2021  ? K 4.9 10/17/2021  ? CL 101 10/17/2021  ? CREATININE 1.81 (H) 10/17/2021  ? BUN 32 (H) 10/17/2021  ? CO2 29 10/17/2021  ? TSH 2.90 02/23/2021  ? INR 1.0 12/13/2013  ? HGBA1C 6.2 06/26/2021  ? ? ?   ?Assessment & Plan:  ? ?Problem List Items Addressed This Visit   ? ? Chronic combined systolic and diastolic CHF (congestive heart failure) ([place])  ?  Off entresto due to hyperkalemia. Continue coreg and eliquis.  Discharged on lasix.  Taking qod.  Saw nephrology.  Was placed on losartan.  This had to be stopped secondary to hyperkalemia.  She weighs herself regularly.  Weight stable.  no evidence of volume overload.  Follow pressures.  Follow metabolic panel.  Breathing better.  Follow. Continue to monitor weights.  ?  ?  ? Chronic obstructive pulmonary disease (HCC)  ?  Recently admitted with copd exacerbation.  Breathing better now.  Continue trelegy and nebs.  F/u with pulmonary as recommended.   ?  ?  ? CKD (chronic kidney disease) stage 3, GFR 30-59 ml/min (HCC)  ?  Continue to avoid antiinflammatories.  Being followed by nephrology.  Discharged on lasix.  Losartan stopped secondary to hyperkalemia.  Follow metabolic panel.  On farxiga now.   ?  ?  ? Depression, major, single episode, mild ([place])   ?  Doing well on effexor and wellbutrin.  ?  ?  ? GERD (gastroesophageal reflux disease)  ?  No upper symptoms reported.  On prilosec.  ?  ?  ? Hypercholesterolemia  ?  Continue lipitor.  Low cholesterol d

## 2021-11-23 NOTE — Assessment & Plan Note (Signed)
Recently admitted with copd exacerbation.  Breathing better now.  Continue trelegy and nebs.  F/u with pulmonary as recommended.   ?

## 2021-11-23 NOTE — Assessment & Plan Note (Addendum)
Blood pressure doing well on hydralazine, coreg, lasix and farxiga - recently added.  Continue current medication regimen.  Follow pressures.  No lower extremity swelling.  Follow. Just had met b through nephrology 11/20/21.  ?

## 2021-11-23 NOTE — Assessment & Plan Note (Signed)
Off entresto due to hyperkalemia. Continue coreg and eliquis.  Discharged on lasix.  Taking qod.  Saw nephrology.  Was placed on losartan.  This had to be stopped secondary to hyperkalemia.  She weighs herself regularly.  Weight stable.  no evidence of volume overload.  Follow pressures.  Follow metabolic panel.  Breathing better.  Follow. Continue to monitor weights.  ?

## 2021-11-23 NOTE — Assessment & Plan Note (Signed)
Doing well on effexor and wellbutrin.  ?

## 2021-11-23 NOTE — Assessment & Plan Note (Signed)
Continue to avoid antiinflammatories.  Being followed by nephrology.  Discharged on lasix.  Losartan stopped secondary to hyperkalemia.  Follow metabolic panel.  On farxiga now.   ?

## 2021-11-23 NOTE — Assessment & Plan Note (Signed)
Documented paroxysmal afib.  Continue eliquis and coreg.  No increased heart rate or palpitations. Appears to be in SR.  Follow.  ?

## 2021-11-23 NOTE — Assessment & Plan Note (Signed)
Continue lipitor.  Low cholesterol diet and exercise.  Follow lipid panel and liver function tests.   

## 2021-11-23 NOTE — Assessment & Plan Note (Signed)
No upper symptoms reported. On prilosec.  

## 2021-11-27 ENCOUNTER — Ambulatory Visit (INDEPENDENT_AMBULATORY_CARE_PROVIDER_SITE_OTHER): Payer: Medicare Other

## 2021-11-27 ENCOUNTER — Other Ambulatory Visit: Payer: Self-pay | Admitting: Cardiovascular Disease

## 2021-11-27 ENCOUNTER — Telehealth: Payer: Self-pay | Admitting: Internal Medicine

## 2021-11-27 ENCOUNTER — Other Ambulatory Visit: Payer: Self-pay

## 2021-11-27 VITALS — Ht 65.0 in | Wt 124.0 lb

## 2021-11-27 DIAGNOSIS — Z0000 Encounter for general adult medical examination without abnormal findings: Secondary | ICD-10-CM | POA: Diagnosis not present

## 2021-11-27 DIAGNOSIS — Z780 Asymptomatic menopausal state: Secondary | ICD-10-CM | POA: Diagnosis not present

## 2021-11-27 MED ORDER — VENLAFAXINE HCL ER 150 MG PO CP24: ORAL_CAPSULE | ORAL | 1 refills | Status: DC

## 2021-11-27 MED ORDER — FUROSEMIDE 40 MG PO TABS: 40.0000 mg | ORAL_TABLET | ORAL | 1 refills | Status: DC

## 2021-11-27 NOTE — Patient Instructions (Addendum)
?[name] , ?Thank you for taking time to come for your Medicare Wellness Visit. I appreciate your ongoing commitment to your health goals. Please review the following plan we discussed and let me know if I can assist you in the future.  ? ?These are the goals we discussed: ? Goals   ? ?  ? Patient Stated  ?   Medication Monitoring (pt-stated)   ?    ?Patient Goals/Self-Care Activities ?Over the next 90 days, patient will:  ?- take medications as prescribed ?focus on medication adherence by using  pill box ?check blood pressure daily, document, and provide at future appointments ?weigh daily, and contact provider if weight gain of >3 lbs in a day or 5 lbs in a week ?- Commit to reduction of tobacco and assess readiness to quit ? ?  ?  ? Other  ?   Healthy Lifestyle   ?   Stay active ?Healthy diet ?  ? ?  ?  ?This is a list of the screening recommended for you and due dates:  ?Health Maintenance  ?Topic Date Due  ? DEXA scan (bone density measurement)  Never done  ? Zoster (Shingles) Vaccine (2 of 2) 12/09/2021*  ? COVID-19 Vaccine (3 - Pfizer risk series) 03/25/2022*  ? Pneumonia Vaccine (2 - PCV) 06/26/2022*  ? Tetanus Vaccine  09/10/2022*  ? Flu Shot  03/26/2022  ? Mammogram  12/19/2022  ? Colon Cancer Screening  01/05/2024  ? Hepatitis C Screening: USPSTF Recommendation to screen - Ages 73-79 yo.  Completed  ? HPV Vaccine  Aged Out  ?*Topic was postponed. The date shown is not the original due date.  ?  ?Bone Density Test ?A bone density test uses a type of X-ray to measure the amount of calcium and other minerals in a person's bones. It can measure bone density in the hip and the spine. The test is similar to having a regular X-ray. This test may also be called: ?Bone densitometry. ?Bone mineral density test. ?Dual-energy X-ray absorptiometry (DEXA). ?You may have this test to: ?Diagnose a condition that causes weak or thin bones (osteoporosis). ?Screen you for osteoporosis. ?Predict your risk for a broken  bone (fracture). ?Determine how well your osteoporosis treatment is working. ?Tell a health care provider about: ?Any allergies you have. ?All medicines you are taking, including vitamins, herbs, eye drops, creams, and over-the-counter medicines. ?Any problems you or family members have had with anesthetic medicines. ?Any blood disorders you have. ?Any surgeries you have had. ?Any medical conditions you have. ?Whether you are pregnant or may be pregnant. ?Any medical tests you have had within the past 14 days that used contrast material. ?What are the risks? ?Generally, this is a safe test. However, it does expose you to a small amount of radiation, which can slightly increase your cancer risk. ?What happens before the test? ?Do not take any calcium supplements within the 24 hours before your test. ?You will need to remove all metal jewelry, eyeglasses, removable dental appliances, and any other metal objects on your body. ?What happens during the test? ? ?You will lie down on an exam table. There will be an X-ray generator below you and an imaging device above you. ?Other devices, such as boxes or braces, may be used to position your body properly for the scan. ?The machine will slowly scan your body. You will need to keep very still while the machine does the scan. ?The images will show up on a screen in  the room. Images will be examined by a specialist after your test is finished. ?The procedure may vary among health care providers and hospitals. ?What can I expect after the test? ?It is up to you to get the results of your test. Ask your health care provider, or the department that is doing the test, when your results will be ready. ?Summary ?A bone density test is an imaging test that uses a type of X-ray to measure the amount of calcium and other minerals in your bones. ?The test may be used to diagnose or screen you for a condition that causes weak or thin bones (osteoporosis), predict your risk for a broken  bone (fracture), or determine how well your osteoporosis treatment is working. ?Do not take any calcium supplements within 24 hours before your test. ?Ask your health care provider, or the department that is doing the test, when your results will be ready. ?This information is not intended to replace advice given to you by your health care provider. Make sure you discuss any questions you have with your health care provider. ?Document Revised: 04/25/2021 Document Reviewed: 01/27/2020 ?Elsevier Patient Education ? [place]. ? ?

## 2021-11-27 NOTE — Telephone Encounter (Signed)
Refills called in 

## 2021-11-27 NOTE — Progress Notes (Signed)
Subjective:   Dawn Sherman is a 66 y.o. female who presents for an Initial Medicare Annual Wellness Visit.  Review of Systems    No ROS.  Medicare Wellness Virtual Visit.  Visual/audio telehealth visit, UTA vital signs.   See social history for additional risk factors.   Cardiac Risk Factors include: advanced age (>29men, >39 women)     Objective:    Today's Vitals   11/27/21 1448  Weight: 124 lb (56.2 kg)  Height: 5\' 5"  (1.651 m)   Body mass index is 20.63 kg/m.     11/27/2021    2:57 PM 08/20/2021    2:07 PM 08/19/2021    5:53 PM 02/11/2020    1:00 PM 02/09/2020    8:18 PM  Advanced Directives  Does Patient Have a Medical Advance Directive? No  No No No  Would patient like information on creating a medical advance directive? No - Patient declined No - Patient declined  No - Patient declined No - Patient declined   Current Medications (verified) Outpatient Encounter Medications as of 11/27/2021  Medication Sig   acetaminophen (TYLENOL) 325 MG tablet Take 650 mg by mouth every 6 (six) hours as needed.   amLODipine (NORVASC) 5 MG tablet Take 1 tablet (5 mg total) by mouth daily.   atorvastatin (LIPITOR) 40 MG tablet TAKE 1 TABLET BY MOUTH EVERY DAY   buPROPion (WELLBUTRIN XL) 150 MG 24 hr tablet TAKE 1 TABLET BY MOUTH EVERY DAY   carvedilol (COREG) 25 MG tablet TAKE 1 TABLET BY MOUTH TWICE A DAY   ELIQUIS 2.5 MG TABS tablet TAKE 1 TABLET BY MOUTH TWICE A DAY   FARXIGA 10 MG TABS tablet Take 10 mg by mouth daily.   Fluticasone-Umeclidin-Vilant (TRELEGY ELLIPTA) 100-62.5-25 MCG/ACT AEPB Inhale 1 puff into the lungs daily.   furosemide (LASIX) 40 MG tablet Take 1 tablet (40 mg total) by mouth every other day.   hydrALAZINE (APRESOLINE) 10 MG tablet Take 1 tablet (10 mg total) by mouth 3 (three) times daily.   ipratropium-albuterol (DUONEB) 0.5-2.5 (3) MG/3ML SOLN Take 3 mLs by nebulization every 4 (four) hours as needed (Shortness of breath/wheeze).   loratadine (CLARITIN) 10  MG tablet Take 10 mg by mouth daily.   omeprazole (PRILOSEC) 20 MG capsule Take 20 mg by mouth daily.   venlafaxine XR (EFFEXOR-XR) 150 MG 24 hr capsule TAKE 1 CAPSULE BY MOUTH EVERY DAY   No facility-administered encounter medications on file as of 11/27/2021.   Allergies (verified) Amoxicillin, Benicar [olmesartan], Codeine sulfate, Morphine and related, Penicillins, Sulfate, and Clindamycin/lincomycin   History: Past Medical History:  Diagnosis Date   B12 deficiency    CHF (congestive heart failure) (HCC)    CKD (chronic kidney disease), stage III (HCC)    Depression    Endometriosis    Hypertension    Mixed hyperlipidemia    Tobacco abuse    Past Surgical History:  Procedure Laterality Date   ABDOMINAL HYSTERECTOMY     BREAST BIOPSY Right 2015   neg-  FIBROADENOMA   TAH/RSO  1999   secondary to bleeding and endometriosis (Dr Haskel Khan)   Family History  Problem Relation Age of Onset   Hypercholesterolemia Mother    Rheum arthritis Father    Rheum arthritis Daughter    Fibromyalgia Daughter    Breast cancer Neg Hx    Colon cancer Neg Hx    Social History   Socioeconomic History   Marital status: Married    Spouse name: Not  on file   Number of children: Not on file   Years of education: Not on file   Highest education level: Not on file  Occupational History   Not on file  Tobacco Use   Smoking status: Every Day    Packs/day: 0.25    Years: 40.00    Pack years: 10.00    Types: Cigarettes   Smokeless tobacco: Never  Vaping Use   Vaping Use: Never used  Substance and Sexual Activity   Alcohol use: Not Currently    Alcohol/week: 0.0 standard drinks    Comment: occasional   Drug use: No   Sexual activity: Not Currently  Other Topics Concern   Not on file  Social History Narrative   Lives in [place] with her husband.  She is retired from [employer]	[place].  She does not routinely exercise.   Social Determinants of Health   Financial Resource  Strain: Low Risk    Difficulty of Paying Living Expenses: Not hard at all  Food Insecurity: No Food Insecurity   Worried About Programme researcher, broadcasting/film/video in the Last Year: Never true   Ran Out of Food in the Last Year: Never true  Transportation Needs: No Transportation Needs   Lack of Transportation (Medical): No   Lack of Transportation (Non-Medical): No  Physical Activity: Sufficiently Active   Days of Exercise per Week: 7 days   Minutes of Exercise per Session: 30 min  Stress: No Stress Concern Present   Feeling of Stress : Not at all  Social Connections: Unknown   Frequency of Communication with Friends and Family: Not on file   Frequency of Social Gatherings with Friends and Family: Not on file   Attends Religious Services: Not on Scientist, clinical (histocompatibility and immunogenetics) or Organizations: Not on file   Attends Banker Meetings: Not on file   Marital Status: Married   Tobacco Counseling Ready to quit: Not Answered Counseling given: Not Answered  Clinical Intake: Pre-visit preparation completed: Yes        Diabetes: No  How often do you need to have someone help you when you read instructions, pamphlets, or other written materials from your doctor or pharmacy?: 1 - Never    Interpreter Needed?: No      Activities of Daily Living    11/27/2021    2:58 PM 09/17/2021    9:21 AM  In your present state of health, do you have any difficulty performing the following activities:  Hearing? 0 0  Vision? 0 0  Difficulty concentrating or making decisions? 0 0  Walking or climbing stairs? 0 0  Dressing or bathing? 0 0  Doing errands, shopping? 0 0  Preparing Food and eating ? N   Using the Toilet? N   In the past six months, have you accidently leaked urine? N   Do you have problems with loss of bowel control? N   Managing your Medications? N   Managing your Finances? N   Housekeeping or managing your Housekeeping? N    Patient Care Team: Dale Tatums, MD as PCP -  General (Internal Medicine) Antonieta Iba, MD as PCP - Cardiology (Cardiology) Lemar Livings Merrily Pew, MD as Consulting Physician (General Surgery)  Indicate any recent Medical Services you may have received from other than [facility] providers in the past year (date may be approximate).     Assessment:   This is a routine wellness examination for Dawn Sherman.  Virtual Visit via Telephone  Note  I connected with  Dawn Sherman on 11/27/21 at  2:45 PM EDT by telephone and verified that I am speaking with the correct person using two identifiers.  Persons participating in the virtual visit: patient/Nurse Health Advisor   I discussed the limitations of performing an evaluation and management service by telehealth.  The patient expressed understanding and agreed to proceed. We continued and completed visit with audio only. Some vital signs may be absent or patient reported.   Hearing/Vision screen Hearing Screening - Comments:: Patient is able to hear conversational tones without difficulty.  No issues reported. Vision Screening - Comments:: Does not wears corrective lenses They have seen their ophthalmologist in the last 12 months.   Dietary issues and exercise activities discussed: Current Exercise Habits: Home exercise routine, Type of exercise: calisthenics, Time (Minutes): 30, Frequency (Times/Week): 7, Weekly Exercise (Minutes/Week): 210, Intensity: Mild Low salt diet Good water intake   Goals Addressed             This Visit's Progress    Healthy Lifestyle       Stay active Healthy diet       Depression Screen    11/27/2021    2:54 PM 09/10/2021   11:47 AM 06/26/2021    1:49 PM 02/23/2021   11:44 AM 02/23/2021   11:29 AM 11/21/2020    8:32 AM 06/28/2020    9:20 AM  PHQ 2/9 Scores  PHQ - 2 Score 0 0 0 0 0 0 0  PHQ- 9 Score   2 0  3 0    Fall Risk    11/27/2021    2:58 PM 09/17/2021    9:22 AM 09/10/2021   11:47 AM 06/26/2021   10:43 AM 02/23/2021   11:29 AM  Fall Risk   Falls in  the past year? 0 0 0 0 0  Number falls in past yr: 0 0 0 0 0  Injury with Fall?  0 0 0 0  Risk for fall due to :    No Fall Risks   Follow up Falls evaluation completed Falls evaluation completed Falls evaluation completed Falls evaluation completed Falls evaluation completed    FALL RISK PREVENTION PERTAINING TO THE HOME: Home free of loose throw rugs in walkways, pet beds, electrical cords, etc? Yes  Adequate lighting in your home to reduce risk of falls? Yes   ASSISTIVE DEVICES UTILIZED TO PREVENT FALLS: Use of a cane, walker or w/c? No   TIMED UP AND GO: Was the test performed? No .   Cognitive Function:  Patient is alert and oriented x3.       11/27/2021    2:59 PM  6CIT Screen  What Year? 0 points  What month? 0 points  Months in reverse 0 points  Repeat phrase 0 points    Immunizations Immunization History  Administered Date(s) Administered   Fluad Quad(high Dose 65+) 07/23/2021   Influenza Inj Mdck Quad With Preservative 08/05/2019   Influenza,inj,Quad PF,6+ Mos 07/02/2018   Influenza-Unspecified 06/22/2020   PFIZER(Purple Top)SARS-COV-2 Vaccination 03/19/2020, 04/09/2020   Pneumococcal Polysaccharide-23 02/13/2020   Zoster Recombinat (Shingrix) 06/19/2021   Screening Tests Health Maintenance  Topic Date Due   DEXA SCAN  Never done   Zoster Vaccines- Shingrix (2 of 2) 12/09/2021 (Originally 08/14/2021)   COVID-19 Vaccine (3 - Pfizer risk series) 03/25/2022 (Originally 05/07/2020)   Pneumonia Vaccine 31+ Years old (2 - PCV) 06/26/2022 (Originally 02/12/2021)   TETANUS/TDAP  09/10/2022 (Originally 06/05/1975)   INFLUENZA  VACCINE  03/26/2022   MAMMOGRAM  12/19/2022   COLONOSCOPY (Pts 45-26yrs Insurance coverage will need to be confirmed)  01/05/2024   Hepatitis C Screening  Completed   HPV VACCINES  Aged Out   Health Maintenance Health Maintenance Due  Topic Date Due   DEXA SCAN  Never done   Vision Screening: Recommended annual ophthalmology exams for  early detection of glaucoma and other disorders of the eye.  Dental Screening: Recommended annual dental exams for proper oral hygiene  Community Resource Referral / Chronic Care Management: CRR required this visit?  No   CCM required this visit?  No      Plan:   Keep all routine maintenance appointments.   I have personally reviewed and noted the following in the patient's chart:   Medical and social history Use of alcohol, tobacco or illicit drugs  Current medications and supplements including opioid prescriptions. Patient is not currently taking opioid prescriptions. Functional ability and status Nutritional status Physical activity Advanced directives List of other physicians Hospitalizations, surgeries, and ER visits in previous 12 months Vitals Screenings to include cognitive, depression, and falls Referrals and appointments  In addition, I have reviewed and discussed with patient certain preventive protocols, quality metrics, and best practice recommendations. A written personalized care plan for preventive services as well as general preventive health recommendations were provided to patient.     Ashok Pall, LPN   08/31/1094

## 2021-11-27 NOTE — Telephone Encounter (Signed)
Pt called in requesting refill on medication (venlafaxine XR (EFFEXOR-XR) 150 MG 24 hr capsule) and (furosemide (LASIX) 40 MG tablet)... Pt requesting callback  ?

## 2021-11-28 NOTE — Telephone Encounter (Signed)
Eliquis 2.5 mg refill request received. Patient is 66 years old, weight- 56.2 kg, Crea- 1.81 on 10/17/21, Diagnosis- PAF, and last seen by Dr. Rockey Situ on 02/07/21. Dose is appropriate based on dosing criteria. Will send in refill to requested pharmacy.   ?

## 2021-12-06 ENCOUNTER — Telehealth: Payer: Self-pay | Admitting: Internal Medicine

## 2021-12-06 NOTE — Telephone Encounter (Signed)
Reviewed list.  Per your note - she is due to take eliquis and coreg this pm.  Take the pm dose as planned.  Monitor blood pressure.  If ok, then just take pm medication and resume regular am meds tomorrow .  Let us know if any problems.  ?

## 2021-12-06 NOTE — Telephone Encounter (Signed)
Pt took and vomitted up the following medications : ? ?Wilder Glade ?Norvasc ?Lipitor ?Wellbutrin ?Coreg ?Eliquis ?Hydralazine ?Claritin ?Prilosec ?Effexor ? ? ?

## 2021-12-06 NOTE — Telephone Encounter (Signed)
Pt called in stating that she think she is having a reaction to medication (FARXIGA 10 MG TABS tablet) that Dr. [name] prescribed... Pt stated that her symptoms are nausea, exteremly tired, and can't keep food down... Pt stated that she called Dr. [name]  office and advised them of the reaction she is having... Pt stated that Dr. [name] advised pt that she can take medication once every other day... Asked pt if she was having any swallowing, and rashes... Pt responded she doesn't have any other symptoms besides nausea and exteremly tired... Pt requesting callback ?

## 2021-12-06 NOTE — Telephone Encounter (Signed)
Need to know which of her medications she takes in the am.  Her coreg is twice a day, so I would hold on taking that one again and just take the pm dose today.  Which of the other medications she is on - does she take in am?  Need to know and then can tell her what to do.  ?

## 2021-12-06 NOTE — Telephone Encounter (Signed)
PT ADVISED.

## 2021-12-07 NOTE — Telephone Encounter (Addendum)
Pt called stating she is not feeling better. Pt still has nausea back pain and cant stay awake after medicine has been changed around. Pt wants to know if there is a bug going around. ?

## 2021-12-10 ENCOUNTER — Emergency Department: Payer: Medicare Other

## 2021-12-10 ENCOUNTER — Inpatient Hospital Stay: Payer: Medicare Other

## 2021-12-10 ENCOUNTER — Inpatient Hospital Stay
Admission: EM | Admit: 2021-12-10 | Discharge: 2021-12-14 | DRG: 083 | Disposition: A | Discharge status: Home / Self Care | Payer: Medicare Other | Attending: Internal Medicine | Admitting: Internal Medicine

## 2021-12-10 ENCOUNTER — Other Ambulatory Visit: Payer: Self-pay

## 2021-12-10 ENCOUNTER — Encounter: Payer: Self-pay | Admitting: Intensive Care

## 2021-12-10 DIAGNOSIS — R55 Syncope and collapse: Secondary | ICD-10-CM | POA: Diagnosis present

## 2021-12-10 DIAGNOSIS — Z7951 Long term (current) use of inhaled steroids: Secondary | ICD-10-CM

## 2021-12-10 DIAGNOSIS — D693 Immune thrombocytopenic purpura: Secondary | ICD-10-CM | POA: Diagnosis present

## 2021-12-10 DIAGNOSIS — E861 Hypovolemia: Secondary | ICD-10-CM | POA: Diagnosis present

## 2021-12-10 DIAGNOSIS — D631 Anemia in chronic kidney disease: Secondary | ICD-10-CM | POA: Diagnosis present

## 2021-12-10 DIAGNOSIS — S8002XA Contusion of left knee, initial encounter: Secondary | ICD-10-CM | POA: Diagnosis present

## 2021-12-10 DIAGNOSIS — J432 Centrilobular emphysema: Secondary | ICD-10-CM | POA: Diagnosis not present

## 2021-12-10 DIAGNOSIS — I4819 Other persistent atrial fibrillation: Secondary | ICD-10-CM | POA: Diagnosis present

## 2021-12-10 DIAGNOSIS — Z7984 Long term (current) use of oral hypoglycemic drugs: Secondary | ICD-10-CM

## 2021-12-10 DIAGNOSIS — Z823 Family history of stroke: Secondary | ICD-10-CM

## 2021-12-10 DIAGNOSIS — Z8349 Family history of other endocrine, nutritional and metabolic diseases: Secondary | ICD-10-CM

## 2021-12-10 DIAGNOSIS — I10 Essential (primary) hypertension: Secondary | ICD-10-CM | POA: Diagnosis not present

## 2021-12-10 DIAGNOSIS — I4891 Unspecified atrial fibrillation: Secondary | ICD-10-CM | POA: Diagnosis present

## 2021-12-10 DIAGNOSIS — I130 Hypertensive heart and chronic kidney disease with heart failure and stage 1 through stage 4 chronic kidney disease, or unspecified chronic kidney disease: Secondary | ICD-10-CM | POA: Diagnosis present

## 2021-12-10 DIAGNOSIS — E538 Deficiency of other specified B group vitamins: Secondary | ICD-10-CM | POA: Diagnosis present

## 2021-12-10 DIAGNOSIS — I959 Hypotension, unspecified: Secondary | ICD-10-CM | POA: Diagnosis present

## 2021-12-10 DIAGNOSIS — Z7901 Long term (current) use of anticoagulants: Secondary | ICD-10-CM | POA: Diagnosis not present

## 2021-12-10 DIAGNOSIS — I480 Paroxysmal atrial fibrillation: Secondary | ICD-10-CM | POA: Diagnosis present

## 2021-12-10 DIAGNOSIS — I482 Chronic atrial fibrillation, unspecified: Secondary | ICD-10-CM | POA: Diagnosis present

## 2021-12-10 DIAGNOSIS — K219 Gastro-esophageal reflux disease without esophagitis: Secondary | ICD-10-CM | POA: Diagnosis present

## 2021-12-10 DIAGNOSIS — Z720 Tobacco use: Secondary | ICD-10-CM | POA: Diagnosis present

## 2021-12-10 DIAGNOSIS — I441 Atrioventricular block, second degree: Secondary | ICD-10-CM | POA: Diagnosis not present

## 2021-12-10 DIAGNOSIS — W1830XA Fall on same level, unspecified, initial encounter: Secondary | ICD-10-CM | POA: Diagnosis present

## 2021-12-10 DIAGNOSIS — Z79899 Other long term (current) drug therapy: Secondary | ICD-10-CM

## 2021-12-10 DIAGNOSIS — I609 Nontraumatic subarachnoid hemorrhage, unspecified: Secondary | ICD-10-CM | POA: Diagnosis not present

## 2021-12-10 DIAGNOSIS — Z8261 Family history of arthritis: Secondary | ICD-10-CM

## 2021-12-10 DIAGNOSIS — Z881 Allergy status to other antibiotic agents status: Secondary | ICD-10-CM

## 2021-12-10 DIAGNOSIS — M5030 Other cervical disc degeneration, unspecified cervical region: Secondary | ICD-10-CM | POA: Diagnosis present

## 2021-12-10 DIAGNOSIS — Z888 Allergy status to other drugs, medicaments and biological substances status: Secondary | ICD-10-CM

## 2021-12-10 DIAGNOSIS — N1832 Chronic kidney disease, stage 3b: Secondary | ICD-10-CM | POA: Diagnosis present

## 2021-12-10 DIAGNOSIS — R29810 Facial weakness: Secondary | ICD-10-CM | POA: Diagnosis present

## 2021-12-10 DIAGNOSIS — T45515A Adverse effect of anticoagulants, initial encounter: Secondary | ICD-10-CM | POA: Diagnosis present

## 2021-12-10 DIAGNOSIS — E860 Dehydration: Secondary | ICD-10-CM | POA: Diagnosis present

## 2021-12-10 DIAGNOSIS — I5043 Acute on chronic combined systolic (congestive) and diastolic (congestive) heart failure: Secondary | ICD-10-CM | POA: Diagnosis present

## 2021-12-10 DIAGNOSIS — F32A Depression, unspecified: Secondary | ICD-10-CM | POA: Diagnosis present

## 2021-12-10 DIAGNOSIS — D696 Thrombocytopenia, unspecified: Secondary | ICD-10-CM | POA: Diagnosis not present

## 2021-12-10 DIAGNOSIS — I951 Orthostatic hypotension: Secondary | ICD-10-CM | POA: Diagnosis present

## 2021-12-10 DIAGNOSIS — E782 Mixed hyperlipidemia: Secondary | ICD-10-CM | POA: Diagnosis present

## 2021-12-10 DIAGNOSIS — W19XXXS Unspecified fall, sequela: Secondary | ICD-10-CM | POA: Diagnosis not present

## 2021-12-10 DIAGNOSIS — D6832 Hemorrhagic disorder due to extrinsic circulating anticoagulants: Secondary | ICD-10-CM | POA: Diagnosis present

## 2021-12-10 DIAGNOSIS — I5042 Chronic combined systolic (congestive) and diastolic (congestive) heart failure: Secondary | ICD-10-CM | POA: Diagnosis present

## 2021-12-10 DIAGNOSIS — I428 Other cardiomyopathies: Secondary | ICD-10-CM | POA: Diagnosis present

## 2021-12-10 DIAGNOSIS — S066XAA Traumatic subarachnoid hemorrhage with loss of consciousness status unknown, initial encounter: Principal | ICD-10-CM | POA: Diagnosis present

## 2021-12-10 DIAGNOSIS — E871 Hypo-osmolality and hyponatremia: Secondary | ICD-10-CM | POA: Diagnosis present

## 2021-12-10 DIAGNOSIS — N179 Acute kidney failure, unspecified: Secondary | ICD-10-CM | POA: Diagnosis present

## 2021-12-10 DIAGNOSIS — N170 Acute kidney failure with tubular necrosis: Secondary | ICD-10-CM | POA: Diagnosis not present

## 2021-12-10 DIAGNOSIS — S0990XA Unspecified injury of head, initial encounter: Secondary | ICD-10-CM | POA: Diagnosis not present

## 2021-12-10 DIAGNOSIS — Y92019 Unspecified place in single-family (private) house as the place of occurrence of the external cause: Secondary | ICD-10-CM | POA: Diagnosis not present

## 2021-12-10 DIAGNOSIS — J449 Chronic obstructive pulmonary disease, unspecified: Secondary | ICD-10-CM | POA: Diagnosis present

## 2021-12-10 DIAGNOSIS — I9589 Other hypotension: Secondary | ICD-10-CM | POA: Diagnosis not present

## 2021-12-10 DIAGNOSIS — I442 Atrioventricular block, complete: Secondary | ICD-10-CM | POA: Diagnosis present

## 2021-12-10 DIAGNOSIS — R109 Unspecified abdominal pain: Secondary | ICD-10-CM | POA: Diagnosis present

## 2021-12-10 DIAGNOSIS — Z885 Allergy status to narcotic agent status: Secondary | ICD-10-CM

## 2021-12-10 DIAGNOSIS — Z880 Allergy status to penicillin: Secondary | ICD-10-CM

## 2021-12-10 DIAGNOSIS — Z90710 Acquired absence of both cervix and uterus: Secondary | ICD-10-CM

## 2021-12-10 DIAGNOSIS — F17210 Nicotine dependence, cigarettes, uncomplicated: Secondary | ICD-10-CM | POA: Diagnosis present

## 2021-12-10 DIAGNOSIS — E876 Hypokalemia: Secondary | ICD-10-CM | POA: Diagnosis present

## 2021-12-10 DIAGNOSIS — Z8679 Personal history of other diseases of the circulatory system: Secondary | ICD-10-CM

## 2021-12-10 LAB — DIFFERENTIAL
Abs Immature Granulocytes: 0.11 10*3/uL — ABNORMAL HIGH (ref 0.00–0.07)
Basophils Absolute: 0.1 10*3/uL (ref 0.0–0.1)
Basophils Relative: 0 %
Eosinophils Absolute: 0.1 10*3/uL (ref 0.0–0.5)
Eosinophils Relative: 1 %
Immature Granulocytes: 1 %
Lymphocytes Relative: 10 %
Lymphs Abs: 1.3 10*3/uL (ref 0.7–4.0)
Monocytes Absolute: 1.3 10*3/uL — ABNORMAL HIGH (ref 0.1–1.0)
Monocytes Relative: 10 %
Neutro Abs: 11 10*3/uL — ABNORMAL HIGH (ref 1.7–7.7)
Neutrophils Relative %: 78 %
Smear Review: DECREASED

## 2021-12-10 LAB — TROPONIN I (HIGH SENSITIVITY): Troponin I (High Sensitivity): 21 ng/L — ABNORMAL HIGH (ref <18)

## 2021-12-10 LAB — COMPREHENSIVE METABOLIC PANEL
ALT: 12 U/L (ref 0–44)
AST: 16 U/L (ref 15–41)
Albumin: 4.1 g/dL (ref 3.5–5.0)
Alkaline Phosphatase: 79 U/L (ref 38–126)
Anion gap: 11 (ref 5–15)
BUN: 60 mg/dL — ABNORMAL HIGH (ref 8–23)
CO2: 18 mmol/L — ABNORMAL LOW (ref 22–32)
Calcium: 8.1 mg/dL — ABNORMAL LOW (ref 8.9–10.3)
Chloride: 101 mmol/L (ref 98–111)
Creatinine, Ser: 2.49 mg/dL — ABNORMAL HIGH (ref 0.44–1.00)
GFR, Estimated: 21 mL/min — ABNORMAL LOW (ref >60)
Glucose, Bld: 146 mg/dL — ABNORMAL HIGH (ref 70–99)
Potassium: 3.2 mmol/L — ABNORMAL LOW (ref 3.5–5.1)
Sodium: 130 mmol/L — ABNORMAL LOW (ref 135–145)
Total Bilirubin: 0.4 mg/dL (ref 0.3–1.2)
Total Protein: 6.9 g/dL (ref 6.5–8.1)

## 2021-12-10 LAB — CBC
HCT: 41 % (ref 36.0–46.0)
Hemoglobin: 13.8 g/dL (ref 12.0–15.0)
MCH: 27.1 pg (ref 26.0–34.0)
MCHC: 33.7 g/dL (ref 30.0–36.0)
MCV: 80.6 fL (ref 80.0–100.0)
Platelets: 44 10*3/uL — ABNORMAL LOW (ref 150–400)
RBC: 5.09 MIL/uL (ref 3.87–5.11)
RDW: 16.7 % — ABNORMAL HIGH (ref 11.5–15.5)
WBC: 13.8 10*3/uL — ABNORMAL HIGH (ref 4.0–10.5)
nRBC: 0 % (ref 0.0–0.2)

## 2021-12-10 LAB — URINALYSIS, ROUTINE W REFLEX MICROSCOPIC
Bilirubin Urine: NEGATIVE
Glucose, UA: NEGATIVE mg/dL
Hgb urine dipstick: NEGATIVE
Ketones, ur: NEGATIVE mg/dL
Leukocytes,Ua: NEGATIVE
Nitrite: NEGATIVE
Protein, ur: 30 mg/dL — AB
Specific Gravity, Urine: 1.006 (ref 1.005–1.030)
pH: 6 (ref 5.0–8.0)

## 2021-12-10 LAB — MAGNESIUM: Magnesium: 2.7 mg/dL — ABNORMAL HIGH (ref 1.7–2.4)

## 2021-12-10 LAB — T4, FREE: Free T4: 0.76 ng/dL (ref 0.61–1.12)

## 2021-12-10 LAB — TSH: TSH: 3.611 u[IU]/mL (ref 0.350–4.500)

## 2021-12-10 LAB — PROTIME-INR
INR: 0.9 (ref 0.8–1.2)
Prothrombin Time: 12.1 seconds (ref 11.4–15.2)

## 2021-12-10 LAB — APTT: aPTT: 26 seconds (ref 24–36)

## 2021-12-10 LAB — BRAIN NATRIURETIC PEPTIDE: B Natriuretic Peptide: 41.5 pg/mL (ref 0.0–100.0)

## 2021-12-10 MED ORDER — SODIUM CHLORIDE 0.9 % IV BOLUS
500.0000 mL | Freq: Once | INTRAVENOUS | Status: AC
  Administered 2021-12-10: 500 mL via INTRAVENOUS

## 2021-12-10 MED ORDER — ACETAMINOPHEN 325 MG PO TABS: 650.0000 mg | ORAL_TABLET | Freq: Four times a day (QID) | ORAL | Status: DC | PRN

## 2021-12-10 MED ORDER — ALBUTEROL SULFATE (2.5 MG/3ML) 0.083% IN NEBU
2.5000 mg | INHALATION_SOLUTION | Freq: Four times a day (QID) | RESPIRATORY_TRACT | Status: DC
  Administered 2021-12-10 – 2021-12-11 (×3): 2.5 mg via RESPIRATORY_TRACT
  Filled 2021-12-10 (×3): qty 3

## 2021-12-10 MED ORDER — ONDANSETRON HCL 4 MG PO TABS: 4.0000 mg | ORAL_TABLET | Freq: Four times a day (QID) | ORAL | Status: DC | PRN

## 2021-12-10 MED ORDER — STROKE: EARLY STAGES OF RECOVERY BOOK: Freq: Once | Status: DC

## 2021-12-10 MED ORDER — POTASSIUM CHLORIDE 20 MEQ PO PACK
20.0000 meq | PACK | Freq: Once | ORAL | Status: AC
  Administered 2021-12-10: 20 meq via ORAL
  Filled 2021-12-10: qty 1

## 2021-12-10 MED ORDER — SODIUM CHLORIDE 0.9 % IV SOLN: Freq: Once | INTRAVENOUS | Status: AC

## 2021-12-10 MED ORDER — SODIUM CHLORIDE 0.9 % IV SOLN: INTRAVENOUS | Status: DC

## 2021-12-10 MED ORDER — ONDANSETRON HCL 4 MG/2ML IJ SOLN: 4.0000 mg | Freq: Four times a day (QID) | INTRAMUSCULAR | Status: DC | PRN

## 2021-12-10 MED ORDER — ACETAMINOPHEN 650 MG RE SUPP: 650.0000 mg | Freq: Four times a day (QID) | RECTAL | Status: DC | PRN

## 2021-12-10 MED ORDER — HYDRALAZINE HCL 20 MG/ML IJ SOLN: 5.0000 mg | INTRAMUSCULAR | Status: DC | PRN

## 2021-12-10 MED ORDER — CHLORHEXIDINE GLUCONATE CLOTH 2 % EX PADS
6.0000 | MEDICATED_PAD | Freq: Every day | CUTANEOUS | Status: DC
  Administered 2021-12-10 – 2021-12-13 (×4): 6 via TOPICAL

## 2021-12-10 NOTE — Telephone Encounter (Signed)
Agree with holding farxiga and need for evaluation.  Please confirm pt is being seen or is going to be seen ?

## 2021-12-10 NOTE — ED Provider Notes (Signed)
? ?[facility] ?Provider Note ? ? ? Event Date/Time  ? First MD Initiated Contact with Patient 12/10/21 1641   ?  (approximate) ? ? ?History  ? ?Fall and Loss of Consciousness ? ? ?HPI ? ?Dawn Sherman is a 66 y.o. female on Eliquis for history of A-fib also with history of CHF presents to the ER for general malaise for the past several days poor p.o. intake some abdominal pain.  Had episode where she fell yesterday.  Felt like she was getting lightheaded she stood up quickly to go to the kitchen and felt like curtains were coming down over her vision she fell striking the left side of her her head.  No seizure like activity.  Husband states that she has been acting funny on Saturday but this was prior to the fall.  Denies any numbness or tingling. ?  ? ? ?Physical Exam  ? ?Triage Vital Signs: ?ED Triage Vitals  ?Enc Vitals Group  ?   BP 12/10/21 1602 (!) 113/98  ?   Pulse Rate 12/10/21 1602 67  ?   Resp 12/10/21 1602 18  ?   Temp --   ?   Temp src --   ?   SpO2 12/10/21 1602 97 %  ?   Weight 12/10/21 1603 123 lb (55.8 kg)  ?   Height 12/10/21 1603 5\' 5"  (1.651 m)  ?   Head Circumference --   ?   Peak Flow --   ?   Pain Score 12/10/21 1602 0  ?   Pain Loc --   ?   Pain Edu? --   ?   Excl. in [place]? --   ? ? ?Most recent vital signs: ?Vitals:  ? 12/10/21 2245 12/10/21 2300  ?BP: (!) 129/49 (!) 129/48  ?Pulse: (!) 45 (!) 113  ?Resp: 15 17  ?Temp: (!) 97.4 ?F (36.3 ?C)   ?SpO2: 99% 99%  ? ? ? ?Constitutional: Alert  ?Eyes: Conjunctivae are normal.  ?Head: Contusion and ecchymosis of the left face no proptosis no deformity. ?Nose: No congestion/rhinnorhea. ?Mouth/Throat: Mucous membranes are moist.   ?Neck: Painless ROM.  ?Cardiovascular:   Good peripheral circulation.  Regularly irregular ?Respiratory: Normal respiratory effort.  No retractions.  No crackles or wheeze ?Gastrointestinal: Soft and nontender.  ?Musculoskeletal:  no deformity contusion of the left knee no effusion ?Neurologic:  MAE  spontaneously. No gross focal neurologic deficits are appreciated.  ?Skin:  Skin is warm, dry and intact. No rash noted. ?Psychiatric: Mood and affect are normal. Speech and behavior are normal. ? ? ? ?ED Results / Procedures / Treatments  ? ?Labs ?(all labs ordered are listed, but only abnormal results are displayed) ?Labs Reviewed  ?CBC - Abnormal; Notable for the following components:  ?    Result Value  ? WBC 13.8 (*)   ? RDW 16.7 (*)   ? Platelets 44 (*)   ? All other components within normal limits  ?DIFFERENTIAL - Abnormal; Notable for the following components:  ? Neutro Abs 11.0 (*)   ? Monocytes Absolute 1.3 (*)   ? Abs Immature Granulocytes 0.11 (*)   ? All other components within normal limits  ?COMPREHENSIVE METABOLIC PANEL - Abnormal; Notable for the following components:  ? Sodium 130 (*)   ? Potassium 3.2 (*)   ? CO2 18 (*)   ? Glucose, Bld 146 (*)   ? BUN 60 (*)   ? Creatinine, Ser 2.49 (*)   ? Calcium  8.1 (*)   ? GFR, Estimated 21 (*)   ? All other components within normal limits  ?URINALYSIS, ROUTINE W REFLEX MICROSCOPIC - Abnormal; Notable for the following components:  ? Color, Urine STRAW (*)   ? APPearance CLEAR (*)   ? Protein, ur 30 (*)   ? Bacteria, UA RARE (*)   ? All other components within normal limits  ?MAGNESIUM - Abnormal; Notable for the following components:  ? Magnesium 2.7 (*)   ? All other components within normal limits  ?MRSA NEXT GEN BY PCR, NASAL  ?PROTIME-INR  ?APTT  ?T4, FREE  ?TSH  ?BRAIN NATRIURETIC PEPTIDE  ?CBG MONITORING, ED  ? ? ? ?EKG ? ?ED ECG REPORT ?I, Merlyn Lot, the attending physician, personally viewed and interpreted this ECG. ? ? Date: 12/10/2021 ? EKG Time: 16:04 ? Rate: 65 ? Rhythm: sinus ? Axis: normal ? Intervals: normal ? ST&T Change: diffuse nonspecific st abn ? ? ? ?RADIOLOGY ?Please see ED Course for my review and interpretation. ? ?I personally reviewed all radiographic images ordered to evaluate for the above acute complaints and reviewed  radiology reports and findings.  These findings were personally discussed with the patient.  Please see medical record for radiology report. ? ? ? ?PROCEDURES: ? ?Critical Care performed: Yes, see critical care procedure note(s) ? ?.Critical Care ?Performed by: Merlyn Lot, MD ?Authorized by: Merlyn Lot, MD  ? ?Critical care provider statement:  ?  Critical care time (minutes):  40 ?  Critical care was necessary to treat or prevent imminent or life-threatening deterioration of the following conditions:  CNS failure or compromise and cardiac failure ?  Critical care was time spent personally by me on the following activities:  Ordering and performing treatments and interventions, ordering and review of laboratory studies, ordering and review of radiographic studies, pulse oximetry, re-evaluation of patient's condition, review of old charts, obtaining history from patient or surrogate, examination of patient, evaluation of patient's response to treatment, discussions with primary provider, discussions with consultants and development of treatment plan with patient or surrogate ? ? ?MEDICATIONS ORDERED IN ED: ?Medications  ? stroke: early stages of recovery book (has no administration in time range)  ?0.9 %  sodium chloride infusion ( Intravenous New Bag/Given 12/10/21 2305)  ?acetaminophen (TYLENOL) tablet 650 mg (has no administration in time range)  ?  Or  ?acetaminophen (TYLENOL) suppository 650 mg (has no administration in time range)  ?ondansetron (ZOFRAN) tablet 4 mg (has no administration in time range)  ?  Or  ?ondansetron (ZOFRAN) injection 4 mg (has no administration in time range)  ?albuterol (PROVENTIL) (2.5 MG/3ML) 0.083% nebulizer solution 2.5 mg (2.5 mg Nebulization Given 12/10/21 2315)  ?Chlorhexidine Gluconate Cloth 2 % PADS 6 each (6 each Topical Given 12/10/21 2246)  ?sodium chloride 0.9 % bolus 500 mL (0 mLs Intravenous Stopped 12/10/21 1813)  ?0.9 %  sodium chloride infusion ( Intravenous  New Bag/Given 12/10/21 2048)  ?potassium chloride (KLOR-CON) packet 20 mEq (20 mEq Oral Given 12/10/21 2214)  ? ? ? ?IMPRESSION / MDM / ASSESSMENT AND PLAN / ED COURSE  ?I reviewed the triage vital signs and the nursing notes. ?             ?               ? ?Differential diagnosis includes, but is not limited to, Dehydration, sepsis, pna, uti, hypoglycemia, cva, drug effect, withdrawal, encephalitis ? ?Patient presented to ER for evaluation of symptoms as described  above.  CT imaging ordered out of triage shows evidence of SAH.  Given fall yesterday and on Eliquis will consult with neurosurgery.  Given general malaise over the past several days concern for other metabolic source of her fall.  Anticipate patient will require hospitalization.  Noted to be having irregular heart rate on monitor she denies any chest pain or pressure at this time. ? ?Clinical Course as of 12/10/21 2325  ?Mon Dec 10, 2021  ?1823 Patient has evidence of AKI.  Leukocytosis with left shift.  Urinalysis still pending.  Chest x-ray without acute abnormality. [PR]  ?1843 Patient's heart rate dropped down to the 30s brief drop in her blood pressure.  Denies any pain did not feels syncopal presyncopal while laying in the bed.  Family at bedside suspects that she is being overmedicated with her blood pressure medication. [PR]  ?1947 Discussed with Dr. Cari Caraway neurosurgery who has recommended MRI to further evaluate and rule out aneurysm.  Patient remains nontoxic-appearing. [PR]  ?2052 MRI does not show any evidence of aneurysm.  No need for additional imaging at this time.  Stable and appropriate for admission to this facility.  Will consult hospitalist for admission. [PR]  ?  ?Clinical Course User Index ?[PR] Merlyn Lot, MD  ? ? ? ?FINAL CLINICAL IMPRESSION(S) / ED DIAGNOSES  ? ?Final diagnoses:  ?SAH (subarachnoid hemorrhage) ([place])  ?AKI (acute kidney injury) ([place])  ? ? ? ?Rx / DC Orders  ? ?ED Discharge Orders   ? ?      Ordered  ?   Amb referral to [facility]       ? 12/10/21 2135  ? ?  ?  ? ?  ? ? ? ?Note:  This document was prepared using Dragon voice recognition software and may include unintentional dictation errors. ? ?  ?Quentin Cornwall,

## 2021-12-10 NOTE — ED Notes (Addendum)
Pt heart rate dropped to 34 for approx 10 seconds then returned to 54. Pt has no symptoms. Quentin Cornwall, MD made aware.  ?

## 2021-12-10 NOTE — ED Notes (Signed)
Pt placed on posey alarm  ?

## 2021-12-10 NOTE — ED Notes (Signed)
Pt husband states pt is at her baseline  ? ?

## 2021-12-10 NOTE — Telephone Encounter (Signed)
Pt would like call back regarding her message and what to do. Pt got dizzy and fell on yesterday.  ?

## 2021-12-10 NOTE — ED Notes (Signed)
Pt knows need for ua . 

## 2021-12-10 NOTE — Telephone Encounter (Signed)
Pt stated she will be going to ER this afternoon when her husband is able to bring her. ?She does not feel comfortable driving, I advised pt that is a good idea at this time.  ?

## 2021-12-10 NOTE — H&P (Signed)
?History and Physical  ? ? ?Patient: Dawn Sherman FUX:[id number] DOB: 1956-06-15 ?DOA: 12/10/2021 ?DOS: the patient was seen and examined on 12/10/2021 ?PCP: [name], MD  ?Patient coming from: Home ? ?Chief Complaint:  ?Chief Complaint  ?Patient presents with  ? Fall  ? Loss of Consciousness  ? ?HPI: Dawn Sherman is a 66 y.o. female with medical history significant of congestive heart failure followed by Dr. Christia Reading:, CKD followed by Dr. Zollie Scale, atrial fibrillation on Eliquis, hypertension, tobacco abuse history, allergies to amoxicillin, Benicar, codeine, morphine, penicillin, sulfate, clindamycin presenting with fall yesterday.  Per chart review patient has been having some generalized weakness and dizziness, nausea since starting Farxiga.  Patient called primary care office today about a fall she had yesterday and was recommended to come to the emergency room and hold her [place].  Per report patient fell yesterday and had loss of consciousness there is a left-sided facial droop that was noted and generalized weakness patient has bruising to the left side of her face from the fall as she does take blood thinners.  Per husband report speech did not make sense yesterday but it is clear today and in triage per nurses note. ?On my assessment patient states that for the past few days she has been having some decreased oral intake generalized weakness and yesterday she felt a little lightheaded and she stood up quickly to go to the kitchen and she blacked out and she felt like curtains were coming over her vision and she did strike the left side of her head and face. ?On evaluation so far in the emergency room with a head CT noncontrast shows subarachnoid bleed and neurosurgery has been consulted and is going to be following patient. ? ? ? ?Review of Systems:  ?Review of Systems  ?Constitutional:  Positive for malaise/fatigue.  ?Neurological:  Positive for dizziness, loss of consciousness, weakness and  headaches.  ? ?Past Medical History:  ?Diagnosis Date  ? B12 deficiency   ? CHF (congestive heart failure) ([place])   ? CKD (chronic kidney disease), stage III ([place])   ? Depression   ? Endometriosis   ? Hypertension   ? Mixed hyperlipidemia   ? Tobacco abuse   ? ?Past Surgical History:  ?Procedure Laterality Date  ? ABDOMINAL HYSTERECTOMY    ? BREAST BIOPSY Right 2015  ? neg-  FIBROADENOMA  ? TAH/RSO  1999  ? secondary to bleeding and endometriosis (Dr Vernie Ammons)  ? ?Social History:  reports that she has been smoking cigarettes. She has a 10.00 pack-year smoking history. She has never used smokeless tobacco. She reports that she does not currently use alcohol. She reports that she does not use drugs. ? ?Allergies  ?Allergen Reactions  ? Amoxicillin   ? Benicar [Olmesartan]   ?  Talked with patient February 10, 2020, intolerance is unclear, tried several medications around that time and one of them gave her a rash but she is not clear which 1.  ? Codeine Sulfate   ? Morphine And Related   ? Penicillins   ? Sulfate   ? Clindamycin/Lincomycin Rash  ? ? ?Family History  ?Problem Relation Age of Onset  ? Hypercholesterolemia Mother   ? Rheum arthritis Father   ? Rheum arthritis Daughter   ? Fibromyalgia Daughter   ? Breast cancer Neg Hx   ? Colon cancer Neg Hx   ? ? ?Prior to Admission medications   ?Medication Sig Start Date End Date Taking? Authorizing Provider  ?acetaminophen (TYLENOL)  325 MG tablet Take 650 mg by mouth every 6 (six) hours as needed.    [provider]  ?amLODipine (NORVASC) 5 MG tablet Take 1 tablet (5 mg total) by mouth daily. 10/25/21   [name], MD  ?apixaban (ELIQUIS) 2.5 MG TABS tablet TAKE ONE TABLET BY MOUTH TWICE DAILY 11/28/21   Minna Merritts, MD  ?atorvastatin (LIPITOR) 40 MG tablet TAKE 1 TABLET BY MOUTH EVERY DAY 08/10/21   [name], MD  ?buPROPion (WELLBUTRIN XL) 150 MG 24 hr tablet TAKE 1 TABLET BY MOUTH EVERY DAY 06/19/21   [name], MD  ?carvedilol (COREG) 25 MG  tablet TAKE 1 TABLET BY MOUTH TWICE A DAY 06/19/21   Minna Merritts, MD  ?FARXIGA 10 MG TABS tablet Take 10 mg by mouth daily. 11/20/21   [provider]  ?Fluticasone-Umeclidin-Vilant (TRELEGY ELLIPTA) 100-62.5-25 MCG/ACT AEPB Inhale 1 puff into the lungs daily. 08/26/21   Little Ishikawa, MD  ?furosemide (LASIX) 40 MG tablet Take 1 tablet (40 mg total) by mouth every other day. 11/27/21   [name], MD  ?hydrALAZINE (APRESOLINE) 10 MG tablet Take 1 tablet (10 mg total) by mouth 3 (three) times daily. 10/25/21   [name], MD  ?ipratropium-albuterol (DUONEB) 0.5-2.5 (3) MG/3ML SOLN Take 3 mLs by nebulization every 4 (four) hours as needed (Shortness of breath/wheeze). 08/26/21   Little Ishikawa, MD  ?loratadine (CLARITIN) 10 MG tablet Take 10 mg by mouth daily.    [provider]  ?omeprazole (PRILOSEC) 20 MG capsule Take 20 mg by mouth daily.    [provider]  ?venlafaxine XR (EFFEXOR-XR) 150 MG 24 hr capsule TAKE 1 CAPSULE BY MOUTH EVERY DAY 11/27/21   [name], MD  ? ? ?Physical Exam: ?Vitals:  ? 12/10/21 2100 12/10/21 2130 12/10/21 2145 12/10/21 2159  ?BP: (!) 145/48 (!) 144/64    ?Pulse: (!) 33 (!) 35 67 90  ?Resp: 15 16 17 17   ?Temp:      ?TempSrc:      ?SpO2: 100% 99% 99% 98%  ?Weight:      ?Height:      ?Blood pressure (!) 144/64, pulse 90, temperature 98.2 ?F (36.8 ?C), temperature source Oral, resp. rate 17, height 5\' 5"  (1.651 m), weight 55.8 kg, SpO2 98 %. ?Ht: 5\' 5"  (165.1 cm) ?Last Wt: 55.8 kg ?BMI: 20.47 kg/m? ?SpO2: 98 % ? ?Physical Exam ?Vitals and nursing note reviewed.  ?Constitutional:   ?   General: She is not in acute distress. ?   Appearance: Normal appearance. She is not ill-appearing, toxic-appearing or diaphoretic.  ?HENT:  ?   Head: Normocephalic and atraumatic.  ?   Right Ear: Hearing and external ear normal.  ?   Left Ear: Hearing and external ear normal.  ?   Nose: Nose normal. No nasal deformity.  ?   Mouth/Throat:  ?   Lips: Pink.  ?    Mouth: Mucous membranes are moist.  ?   Tongue: No lesions.  ?   Pharynx: Oropharynx is clear.  ?Eyes:  ?   General: No visual field deficit. ?   Extraocular Movements: Extraocular movements intact.  ?   Pupils: Pupils are equal, round, and reactive to light.  ?Neck:  ?   Vascular: No carotid bruit.  ?Cardiovascular:  ?   Rate and Rhythm: Normal rate and regular rhythm.  ?   Pulses: Normal pulses.  ?   Heart sounds: Normal heart sounds.  ?Pulmonary:  ?  Effort: Pulmonary effort is normal.  ?   Breath sounds: Normal breath sounds.  ?Abdominal:  ?   General: Bowel sounds are normal. There is no distension.  ?   Palpations: Abdomen is soft. There is no mass.  ?   Tenderness: There is no abdominal tenderness. There is no guarding.  ?   Hernia: No hernia is present.  ?Musculoskeletal:  ?   Right lower leg: No edema.  ?   Left lower leg: No edema.  ?Skin: ?   General: Skin is warm.  ?Neurological:  ?   General: No focal deficit present.  ?   Mental Status: She is alert and oriented to person, place, and time.  ?   Cranial Nerves: Cranial nerves 2-12 are intact. No cranial nerve deficit, dysarthria or facial asymmetry.  ?   Motor: Motor function is intact. No weakness or tremor.  ?   Coordination: Finger-Nose-Finger Test and Heel to [place] Test normal.  ?   Deep Tendon Reflexes: Reflexes abnormal.  ?   Reflex Scores: ?     Bicep reflexes are 2+ on the right side and 2+ on the left side. ?     Patellar reflexes are 0 on the right side and 0 on the left side. ?Psychiatric:     ?   Attention and Perception: Attention normal.     ?   Mood and Affect: Mood normal.     ?   Speech: Speech normal.     ?   Behavior: Behavior normal. Behavior is cooperative.     ?   Cognition and Memory: Cognition normal.  ? ? ?Data Reviewed: ?Results for orders placed or performed during the hospital encounter of 12/10/21 (from the past 24 hour(s))  ?Protime-INR     Status: None  ? Collection Time: 12/10/21  4:06 PM  ?Result Value Ref Range  ?  Prothrombin Time 12.1 11.4 - 15.2 seconds  ? INR 0.9 0.8 - 1.2  ?APTT     Status: None  ? Collection Time: 12/10/21  4:06 PM  ?Result Value Ref Range  ? aPTT 26 24 - 36 seconds  ?CBC     Status: Abnormal  ? Colle

## 2021-12-10 NOTE — ED Triage Notes (Addendum)
Patient reports fall and LOC yesterday afternoon. Noted left sided facial droop. Reports weakness all over but more so in left leg. Bruising noted to left side of face from fall. Takes blood thinners daily. Husband reports patients speech "did not make sense" yesterday. Speech clear and normal in triage at this time. Patient c/o intermittent blurry vision since episode yesterday ?

## 2021-12-10 NOTE — ED Notes (Addendum)
Pt was at home on Sunday and had a fall. She states she felt light headed and had nothing to grab onto and went down. Pt hit head on door facing but denies loc. Pt has bruising to left eye, right cheek and right lip. Also left wrist and left knee area.  ? ?

## 2021-12-11 ENCOUNTER — Inpatient Hospital Stay (HOSPITAL_COMMUNITY)
Admit: 2021-12-11 | Discharge: 2021-12-11 | Disposition: A | Discharge status: Home / Self Care | Payer: Medicare Other | Attending: Medical | Admitting: Medical

## 2021-12-11 DIAGNOSIS — I442 Atrioventricular block, complete: Secondary | ICD-10-CM | POA: Diagnosis not present

## 2021-12-11 DIAGNOSIS — N1832 Chronic kidney disease, stage 3b: Secondary | ICD-10-CM

## 2021-12-11 DIAGNOSIS — D696 Thrombocytopenia, unspecified: Secondary | ICD-10-CM | POA: Diagnosis not present

## 2021-12-11 DIAGNOSIS — I609 Nontraumatic subarachnoid hemorrhage, unspecified: Secondary | ICD-10-CM

## 2021-12-11 DIAGNOSIS — S0990XA Unspecified injury of head, initial encounter: Secondary | ICD-10-CM | POA: Diagnosis not present

## 2021-12-11 DIAGNOSIS — N179 Acute kidney failure, unspecified: Secondary | ICD-10-CM

## 2021-12-11 DIAGNOSIS — I441 Atrioventricular block, second degree: Secondary | ICD-10-CM

## 2021-12-11 DIAGNOSIS — I4891 Unspecified atrial fibrillation: Secondary | ICD-10-CM

## 2021-12-11 DIAGNOSIS — D693 Immune thrombocytopenic purpura: Secondary | ICD-10-CM

## 2021-12-11 DIAGNOSIS — Z79899 Other long term (current) drug therapy: Secondary | ICD-10-CM

## 2021-12-11 DIAGNOSIS — R55 Syncope and collapse: Secondary | ICD-10-CM | POA: Diagnosis not present

## 2021-12-11 DIAGNOSIS — Z7901 Long term (current) use of anticoagulants: Secondary | ICD-10-CM

## 2021-12-11 DIAGNOSIS — I10 Essential (primary) hypertension: Secondary | ICD-10-CM

## 2021-12-11 LAB — BASIC METABOLIC PANEL
Anion gap: 8 (ref 5–15)
BUN: 54 mg/dL — ABNORMAL HIGH (ref 8–23)
CO2: 16 mmol/L — ABNORMAL LOW (ref 22–32)
Calcium: 7.3 mg/dL — ABNORMAL LOW (ref 8.9–10.3)
Chloride: 108 mmol/L (ref 98–111)
Creatinine, Ser: 2.36 mg/dL — ABNORMAL HIGH (ref 0.44–1.00)
GFR, Estimated: 22 mL/min — ABNORMAL LOW (ref >60)
Glucose, Bld: 90 mg/dL (ref 70–99)
Potassium: 3.2 mmol/L — ABNORMAL LOW (ref 3.5–5.1)
Sodium: 132 mmol/L — ABNORMAL LOW (ref 135–145)

## 2021-12-11 LAB — CBC
HCT: 34.1 % — ABNORMAL LOW (ref 36.0–46.0)
HCT: 34.1 % — ABNORMAL LOW (ref 36.0–46.0)
Hemoglobin: 11.4 g/dL — ABNORMAL LOW (ref 12.0–15.0)
Hemoglobin: 11.5 g/dL — ABNORMAL LOW (ref 12.0–15.0)
MCH: 26.9 pg (ref 26.0–34.0)
MCH: 27.1 pg (ref 26.0–34.0)
MCHC: 33.4 g/dL (ref 30.0–36.0)
MCHC: 33.7 g/dL (ref 30.0–36.0)
MCV: 80.2 fL (ref 80.0–100.0)
MCV: 80.4 fL (ref 80.0–100.0)
Platelets: 28 10*3/uL — CL (ref 150–400)
Platelets: 29 10*3/uL — CL (ref 150–400)
RBC: 4.24 MIL/uL (ref 3.87–5.11)
RBC: 4.25 MIL/uL (ref 3.87–5.11)
RDW: 16.6 % — ABNORMAL HIGH (ref 11.5–15.5)
RDW: 16.7 % — ABNORMAL HIGH (ref 11.5–15.5)
WBC: 12.4 10*3/uL — ABNORMAL HIGH (ref 4.0–10.5)
WBC: 14.1 10*3/uL — ABNORMAL HIGH (ref 4.0–10.5)
nRBC: 0 % (ref 0.0–0.2)
nRBC: 0 % (ref 0.0–0.2)

## 2021-12-11 LAB — ECHOCARDIOGRAM COMPLETE
AR max vel: 1.89 cm2
AV Area VTI: 2.39 cm2
AV Area mean vel: 2.06 cm2
AV Mean grad: 5 mmHg
AV Peak grad: 10.9 mmHg
Ao pk vel: 1.65 m/s
Area-P 1/2: 3.56 cm2
Height: 65 in
MV VTI: 2.39 cm2
S' Lateral: 2.3 cm
Weight: 1904.77 oz

## 2021-12-11 LAB — MRSA NEXT GEN BY PCR, NASAL: MRSA by PCR Next Gen: NOT DETECTED

## 2021-12-11 LAB — PLATELET COUNT: Platelets: 23 10*3/uL — CL (ref 150–400)

## 2021-12-11 LAB — PHOSPHORUS: Phosphorus: 3.1 mg/dL (ref 2.5–4.6)

## 2021-12-11 LAB — TROPONIN I (HIGH SENSITIVITY): Troponin I (High Sensitivity): 21 ng/L — ABNORMAL HIGH (ref <18)

## 2021-12-11 LAB — TYPE AND SCREEN
ABO/RH(D): O NEG
Antibody Screen: NEGATIVE

## 2021-12-11 LAB — GLUCOSE, CAPILLARY: Glucose-Capillary: 103 mg/dL — ABNORMAL HIGH (ref 70–99)

## 2021-12-11 LAB — LACTATE DEHYDROGENASE: LDH: 135 U/L (ref 98–192)

## 2021-12-11 LAB — MAGNESIUM: Magnesium: 2.4 mg/dL (ref 1.7–2.4)

## 2021-12-11 LAB — VITAMIN B12: Vitamin B-12: 1354 pg/mL — ABNORMAL HIGH (ref 180–914)

## 2021-12-11 LAB — ABO/RH: ABO/RH(D): O NEG

## 2021-12-11 LAB — FOLATE: Folate: 20.7 ng/mL (ref >5.9)

## 2021-12-11 LAB — PATHOLOGIST SMEAR REVIEW

## 2021-12-11 LAB — IMMATURE PLATELET FRACTION: Immature Platelet Fraction: 12.5 % — ABNORMAL HIGH (ref 1.2–8.6)

## 2021-12-11 MED ORDER — POTASSIUM CHLORIDE CRYS ER 20 MEQ PO TBCR
40.0000 meq | EXTENDED_RELEASE_TABLET | Freq: Once | ORAL | Status: AC
  Administered 2021-12-11: 40 meq via ORAL
  Filled 2021-12-11: qty 2

## 2021-12-11 MED ORDER — IPRATROPIUM-ALBUTEROL 0.5-2.5 (3) MG/3ML IN SOLN: 3.0000 mL | RESPIRATORY_TRACT | Status: DC | PRN

## 2021-12-11 MED ORDER — SODIUM CHLORIDE 0.9% IV SOLUTION: Freq: Once | INTRAVENOUS | Status: DC

## 2021-12-11 MED ORDER — IMMUNE GLOBULIN (HUMAN) 5 GM/50ML IV SOLN
55.0000 g | INTRAVENOUS | Status: AC
  Administered 2021-12-11 – 2021-12-12 (×2): 55 g via INTRAVENOUS
  Filled 2021-12-11 (×3): qty 100

## 2021-12-11 MED ORDER — SODIUM CHLORIDE 0.9 % IV SOLN
INTRAVENOUS | Status: AC
Start: 2021-12-11 — End: 2021-12-12

## 2021-12-11 MED ORDER — ACETAMINOPHEN 325 MG PO TABS
650.0000 mg | ORAL_TABLET | Freq: Every day | ORAL | Status: AC
  Administered 2021-12-11 – 2021-12-12 (×2): 650 mg via ORAL
  Filled 2021-12-11 (×2): qty 2

## 2021-12-11 MED ORDER — PANTOPRAZOLE SODIUM 40 MG PO TBEC
40.0000 mg | DELAYED_RELEASE_TABLET | Freq: Every day | ORAL | Status: AC
  Administered 2021-12-11 – 2021-12-14 (×4): 40 mg via ORAL
  Filled 2021-12-11 (×4): qty 1

## 2021-12-11 MED ORDER — VENLAFAXINE HCL ER 150 MG PO CP24
150.0000 mg | ORAL_CAPSULE | Freq: Every day | ORAL | Status: DC
  Administered 2021-12-11 – 2021-12-14 (×4): 150 mg via ORAL
  Filled 2021-12-11 (×2): qty 2
  Filled 2021-12-11: qty 1
  Filled 2021-12-11: qty 2

## 2021-12-11 MED ORDER — FLUTICASONE FUROATE-VILANTEROL 100-25 MCG/ACT IN AEPB
1.0000 | INHALATION_SPRAY | Freq: Every day | RESPIRATORY_TRACT | Status: DC
  Administered 2021-12-11 – 2021-12-14 (×4): 1 via RESPIRATORY_TRACT
  Filled 2021-12-11: qty 28

## 2021-12-11 MED ORDER — LORATADINE 10 MG PO TABS
10.0000 mg | ORAL_TABLET | Freq: Every day | ORAL | Status: DC
  Administered 2021-12-11 – 2021-12-14 (×4): 10 mg via ORAL
  Filled 2021-12-11 (×4): qty 1

## 2021-12-11 MED ORDER — DEXAMETHASONE 6 MG PO TABS
40.0000 mg | ORAL_TABLET | Freq: Every day | ORAL | Status: AC
  Administered 2021-12-11 – 2021-12-14 (×4): 40 mg via ORAL
  Filled 2021-12-11 (×4): qty 1

## 2021-12-11 MED ORDER — HYDRALAZINE HCL 10 MG PO TABS
10.0000 mg | ORAL_TABLET | Freq: Three times a day (TID) | ORAL | Status: DC
  Administered 2021-12-11 – 2021-12-14 (×9): 10 mg via ORAL
  Filled 2021-12-11 (×11): qty 1

## 2021-12-11 MED ORDER — ACETAMINOPHEN 325 MG PO TABS: 650.0000 mg | ORAL_TABLET | Freq: Every day | ORAL | Status: DC

## 2021-12-11 MED ORDER — ATORVASTATIN CALCIUM 20 MG PO TABS
40.0000 mg | ORAL_TABLET | Freq: Every evening | ORAL | Status: DC
  Administered 2021-12-11 – 2021-12-13 (×3): 40 mg via ORAL
  Filled 2021-12-11 (×3): qty 2

## 2021-12-11 MED ORDER — BUPROPION HCL ER (XL) 150 MG PO TB24
150.0000 mg | ORAL_TABLET | Freq: Every day | ORAL | Status: DC
  Administered 2021-12-11 – 2021-12-14 (×4): 150 mg via ORAL
  Filled 2021-12-11 (×4): qty 1

## 2021-12-11 MED ORDER — UMECLIDINIUM BROMIDE 62.5 MCG/ACT IN AEPB
1.0000 | INHALATION_SPRAY | Freq: Every day | RESPIRATORY_TRACT | Status: DC
  Administered 2021-12-11 – 2021-12-14 (×4): 1 via RESPIRATORY_TRACT
  Filled 2021-12-11: qty 7

## 2021-12-11 NOTE — Consult Note (Signed)
?Cardiology Consultation:  ? ?Patient ID: Dawn Sherman ?MRN: [id number]; DOB: 31-Aug-1955 ? ?Admit date: 12/10/2021 ?Date of Consult: 12/11/2021 ? ?PCP:  [name], MD ?  ?[facility] Providers ?Cardiologist:  Ida Rogue, MD   { ? ? ?Patient Profile:  ? ?Dawn Sherman is a 66 y.o. female with a hx of tobacco use, prior alcohol use, CKD, HTN, CHF, paroxysmal AFib who is being seen 12/11/2021 for the evaluation of anticoagulation in the setting of SAH at the request of Dr. Posey Pronto. ? ?History of Present Illness:  ? ?[name] is followed by Dr. Rockey Situ for the above cardiac issues.  The patient was admitted 02/10/2020 [facility] for dyspnea.  Chest x-ray showed COPD and chronic changes without active disease.  CTA chest negative for PE but showed small pleural effusions, interstitial edema, mild aortic atherosclerosis.  She was also diagnosed with new onset PAF.  She declined cardiac cath and subsequently had a Lexiscan Myoview showing no significant ischemia.  Echo 02/13/2020 showed LVEF 30 to 53%, grade 2 diastolic dysfunction, RV normal size and function with mildly elevated PASP, moderate MR, mild to moderate TR. ? ?02/21/2020 she was noted to be mildly hyperkalemic, but with repeat labs stable, encouraged to follow-up with nephrologist.  Her Eliquis had not been started at discharge due to necessity for reduced dose due to weight and renal function, she was later started on Eliquis 2.5 mg twice daily.   ? ?Echo 08/09/2020 showed LVEF 64%, grade 1 diastolic dysfunction.  Lasix subsequently discontinued due to elevated creatinine. ? ?The patient was last seen 02/07/2021 and was overall doing well from a cardiac standpoint.  She was in normal sinus rhythm.  No changes were made. ? ?The patient presented to [facility]/10[facility] for a fall. Started feeling sick last week with nausea, vomiting and fever. She spent ttwo days sleeping and then just in bed. Felt dizzy during these days as well. 2 days ago she was walking to the  kitchen and felt like she was going to fall. She felt weakness in her legs and dizziness and she fell agains the wall on the left side. The next day she called PCP who recommended ER evaluation. She denies recent chest pain, SOB, LLE. Reports compliance with medications, last dose 4/17 am.  ? ?In the ER BP 113/98 RR 18, afebrile. Heart rate noted to be down into the 40s. Labs showed WBC 13.8, plt 44k, sodium 130, K 3.2, CO2 18, Scr 2.49, BUN 60, Mag 2.7. CT head showed acute SAH. CXR nonacute. CT abd/pelvis nonacute. MRI head without aneurysm. EKG shows SR 66bpm with 1st degree AVB and TWI inf/lat leads. Neurosurgury was consulted and the patient was admitted for further work-up. ? ?Telemetry shows bradycardia with Complete heart block and 2:1 AV block. ? ?Past Medical History:  ?Diagnosis Date  ? B12 deficiency   ? CHF (congestive heart failure) ([place])   ? CKD (chronic kidney disease), stage III ([place])   ? Depression   ? Endometriosis   ? Hypertension   ? Mixed hyperlipidemia   ? Tobacco abuse   ? ? ?Past Surgical History:  ?Procedure Laterality Date  ? ABDOMINAL HYSTERECTOMY    ? BREAST BIOPSY Right 2015  ? neg-  FIBROADENOMA  ? TAH/RSO  1999  ? secondary to bleeding and endometriosis (Dr Vernie Ammons)  ?  ? ?Home Medications:  ?Prior to Admission medications   ?Medication Sig Start Date End Date Taking? Authorizing Provider  ?acetaminophen (TYLENOL) 325 MG tablet Take 650  mg by mouth every 6 (six) hours as needed.    [provider]  ?amLODipine (NORVASC) 5 MG tablet Take 1 tablet (5 mg total) by mouth daily. 10/25/21   [name], MD  ?apixaban (ELIQUIS) 2.5 MG TABS tablet TAKE ONE TABLET BY MOUTH TWICE DAILY 11/28/21   Minna Merritts, MD  ?atorvastatin (LIPITOR) 40 MG tablet TAKE 1 TABLET BY MOUTH EVERY DAY 08/10/21   [name], MD  ?buPROPion (WELLBUTRIN XL) 150 MG 24 hr tablet TAKE 1 TABLET BY MOUTH EVERY DAY 06/19/21   [name], MD  ?carvedilol (COREG) 25 MG tablet TAKE 1 TABLET BY MOUTH  TWICE A DAY 06/19/21   Minna Merritts, MD  ?FARXIGA 10 MG TABS tablet Take 10 mg by mouth daily. 11/20/21   [provider]  ?Fluticasone-Umeclidin-Vilant (TRELEGY ELLIPTA) 100-62.5-25 MCG/ACT AEPB Inhale 1 puff into the lungs daily. 08/26/21   Little Ishikawa, MD  ?furosemide (LASIX) 40 MG tablet Take 1 tablet (40 mg total) by mouth every other day. 11/27/21   [name], MD  ?hydrALAZINE (APRESOLINE) 10 MG tablet Take 1 tablet (10 mg total) by mouth 3 (three) times daily. 10/25/21   [name], MD  ?ipratropium-albuterol (DUONEB) 0.5-2.5 (3) MG/3ML SOLN Take 3 mLs by nebulization every 4 (four) hours as needed (Shortness of breath/wheeze). 08/26/21   Little Ishikawa, MD  ?loratadine (CLARITIN) 10 MG tablet Take 10 mg by mouth daily.    [provider]  ?omeprazole (PRILOSEC) 20 MG capsule Take 20 mg by mouth daily.    [provider]  ?venlafaxine XR (EFFEXOR-XR) 150 MG 24 hr capsule TAKE 1 CAPSULE BY MOUTH EVERY DAY 11/27/21   [name], MD  ? ? ?Inpatient Medications: ?Scheduled Meds: ?  stroke: early stages of recovery book   Does not apply Once  ? albuterol  2.5 mg Nebulization Q6H  ? atorvastatin  40 mg Oral QPM  ? buPROPion  150 mg Oral Daily  ? Chlorhexidine Gluconate Cloth  6 each Topical Q0600  ? fluticasone furoate-vilanterol  1 puff Inhalation Daily  ? And  ? umeclidinium bromide  1 puff Inhalation Daily  ? hydrALAZINE  10 mg Oral TID  ? venlafaxine XR  150 mg Oral Q breakfast  ? ?Continuous Infusions: ? sodium chloride 50 mL/hr at 12/11/21 0700  ? ?PRN Meds: ?acetaminophen **OR** acetaminophen, ipratropium-albuterol, ondansetron **OR** ondansetron (ZOFRAN) IV ? ?Allergies:    ?Allergies  ?Allergen Reactions  ? Amoxicillin   ? Benicar [Olmesartan]   ?  Talked with patient February 10, 2020, intolerance is unclear, tried several medications around that time and one of them gave her a rash but she is not clear which 1.  ? Codeine Sulfate   ? Morphine And Related    ? Penicillins   ? Sulfate   ? Clindamycin/Lincomycin Rash  ? ? ?Social History:   ?Social History  ? ?Socioeconomic History  ? Marital status: Married  ?  Spouse name: Not on file  ? Number of children: Not on file  ? Years of education: Not on file  ? Highest education level: Not on file  ?Occupational History  ? Not on file  ?Tobacco Use  ? Smoking status: Every Day  ?  Packs/day: 0.25  ?  Years: 40.00  ?  Pack years: 10.00  ?  Types: Cigarettes  ? Smokeless tobacco: Never  ?Vaping Use  ? Vaping Use: Never used  ?Substance and Sexual Activity  ? Alcohol use: Not Currently  ?  Alcohol/week: 0.0 standard drinks  ?  Comment: occasional  ? Drug use: No  ? Sexual activity: Not Currently  ?Other Topics Concern  ? Not on file  ?Social History Narrative  ? Lives in [place] with her husband.  She is retired from [employer].  She does not routinely exercise.  ? ?Social Determinants of Health  ? ?Financial Resource Strain: Low Risk   ? Difficulty of Paying Living Expenses: Not hard at all  ?Food Insecurity: No Food Insecurity  ? Worried About Charity fundraiser in the Last Year: Never true  ? Ran Out of Food in the Last Year: Never true  ?Transportation Needs: No Transportation Needs  ? Lack of Transportation (Medical): No  ? Lack of Transportation (Non-Medical): No  ?Physical Activity: Sufficiently Active  ? Days of Exercise per Week: 7 days  ? Minutes of Exercise per Session: 30 min  ?Stress: No Stress Concern Present  ? Feeling of Stress : Not at all  ?Social Connections: Unknown  ? Frequency of Communication with Friends and Family: Not on file  ? Frequency of Social Gatherings with Friends and Family: Not on file  ? Attends Religious Services: Not on file  ? Active Member of Clubs or Organizations: Not on file  ? Attends Archivist Meetings: Not on file  ? Marital Status: Married  ?Intimate Partner Violence: Not At Risk  ? Fear of Current or Ex-Partner: No  ? Emotionally Abused: No  ?  Physically Abused: No  ? Sexually Abused: No  ?  ?Family History:   ? ?Family History  ?Problem Relation Age of Onset  ? Hypercholesterolemia Mother   ? Rheum arthritis Father   ? Rheum arthritis Daughter   ? Fib

## 2021-12-11 NOTE — Progress Notes (Signed)
[place] at [facility] ? ? ?PATIENT NAME: Dawn Sherman   ? ?MR#:  [id number] ? ?DATE OF BIRTH:  26-Aug-1956 ? ?SUBJECTIVE:  ? ?patient's brother at bedside. ?Came in after a fall at home accidental. Bruised her face and left knee. Found to have intracranial bleed. Patient alert and oriented times three. No focal deficit. Denies any headache. ?No chest pain or shortness of breath. ? ?VITALS:  ?Blood pressure (!) 129/46, pulse (!) 41, temperature 98.2 ?F (36.8 ?C), temperature source Oral, resp. rate 16, height 5\' 5"  (1.651 m), weight 54 kg, SpO2 100 %. ? ?PHYSICAL EXAMINATION:  ? ?GENERAL:  66 y.o.-year-old patient lying in the bed with no acute distress. Facial bruise ?LUNGS: Normal breath sounds bilaterally, no wheezing, rales, rhonchi.  ?CARDIOVASCULAR: S1, S2 normal. No murmurs, rubs, or gallops.  ?ABDOMEN: Soft, nontender, nondistended. Bowel sounds present.  ?EXTREMITIES: No  edema b/l.    ?NEUROLOGIC: nonfocal  patient is alert and awake ?SKIN: bruise over the left knee ? ?LABORATORY PANEL:  ?CBC ?Recent Labs  ?Lab 12/11/21 ?5885  ?WBC 14.1*  ?HGB 11.5*  ?HCT 34.1*  ?PLT 28*  ? ? ?Chemistries  ?Recent Labs  ?Lab 12/10/21 ?1606 12/11/21 ?0277  ?NA 130* 132*  ?K 3.2* 3.2*  ?CL 101 108  ?CO2 18* 16*  ?GLUCOSE 146* 90  ?BUN 60* 54*  ?CREATININE 2.49* 2.36*  ?CALCIUM 8.1* 7.3*  ?MG 2.7* 2.4  ?AST 16  --   ?ALT 12  --   ?ALKPHOS 79  --   ?BILITOT 0.4  --   ? ?Cardiac Enzymes ?No results for input(s): TROPONINI in the last 168 hours. ?RADIOLOGY:  ?CT ABDOMEN PELVIS WO CONTRAST ? ?Result Date: 12/10/2021 ?CLINICAL DATA:  Sepsis. EXAM: CT ABDOMEN AND PELVIS WITHOUT CONTRAST TECHNIQUE: Multidetector CT imaging of the abdomen and pelvis was performed following the standard protocol without IV contrast. RADIATION DOSE REDUCTION: This exam was performed according to the departmental dose-optimization program which includes automated exposure control, adjustment of the mA and/or kV according to  patient size and/or use of iterative reconstruction technique. COMPARISON:  None. FINDINGS: Motion degraded scan, limiting assessment. Lower chest: No significant pulmonary nodules or acute consolidative airspace disease. Hepatobiliary: Normal liver size. No liver mass. Poorly visualized region of the gallbladder fossa due to motion degradation, unable to assess if the gallbladder is collapsed or absent. No biliary ductal dilatation. Pancreas: Normal, with no mass or duct dilation. Spleen: Normal size. No mass. Adrenals/Urinary Tract: Normal adrenals. Severe asymmetric right renal atrophy. No hydronephrosis. No renal stones. No contour deforming renal masses. Normal bladder. Stomach/Bowel: Normal non-distended stomach. Normal caliber small bowel with no small bowel wall thickening. Normal appendix. Normal large bowel with no diverticulosis, large bowel wall thickening or pericolonic fat stranding. Vascular/Lymphatic: Atherosclerotic nonaneurysmal abdominal aorta. No pathologically enlarged lymph nodes in the abdomen or pelvis. Reproductive: Status post hysterectomy, with no abnormal findings at the vaginal cuff. No adnexal mass. Other: No pneumoperitoneum, ascites or focal fluid collection. Musculoskeletal: No aggressive appearing focal osseous lesions. Mild levocurvature of the lumbar spine. IMPRESSION: 1. Very limited motion degraded unenhanced scan. No acute abnormality. No evidence of bowel obstruction or acute bowel inflammation. Normal appendix. 2. Severe asymmetric right renal atrophy. 3. Aortic Atherosclerosis (ICD10-I70.0). Electronically Signed   By: Ilona Sorrel M.D.   On: 12/10/2021 17:51  ? ?DG Chest 2 View ? ?Result Date: 12/10/2021 ?CLINICAL DATA:  Weakness for 3 days, fell, tobacco abuse EXAM: CHEST - 2 VIEW COMPARISON:  08/19/2021 FINDINGS: Frontal and lateral views of the chest demonstrate a stable cardiac silhouette. No acute airspace disease, effusion, or pneumothorax. Prior healed left rib  fractures. Stable right convex thoracolumbar scoliosis. IMPRESSION: 1. No acute intrathoracic process. Electronically Signed   By: Randa Ngo M.D.   On: 12/10/2021 17:39  ? ?CT HEAD WO CONTRAST ? ?Result Date: 12/10/2021 ?CLINICAL DATA:  Fall and loss of consciousness yesterday afternoon. Noted left-sided facial droop. EXAM: CT HEAD WITHOUT CONTRAST CT CERVICAL SPINE WITHOUT CONTRAST TECHNIQUE: Multidetector CT imaging of the head and cervical spine was performed following the standard protocol without intravenous contrast. Multiplanar CT image reconstructions of the cervical spine were also generated. RADIATION DOSE REDUCTION: This exam was performed according to the departmental dose-optimization program which includes automated exposure control, adjustment of the mA and/or kV according to patient size and/or use of iterative reconstruction technique. COMPARISON:  None. FINDINGS: CT HEAD FINDINGS Brain: There is acute subarachnoid hemorrhage along the left sylvian fissure and scattered foci of subarachnoid hemorrhage in the left frontal lobe. There is also linear hyperdense focus in the right medial temporal lobe (series 3, image 8-9). There is also small focus of hemorrhage in the right frontal lobe. There is no evidence of significant edema or midline shift. Vascular: No hyperdense vessel or unexpected calcification. Skull: Normal. Negative for fracture or focal lesion. Sinuses/Orbits: No acute finding. Other: None. CT CERVICAL SPINE FINDINGS Alignment: Reversal of normal cervical lordosis. Grade 1 anterolisthesis of C2. Stepwise retrolisthesis of C3, C4 and C5. Skull base and vertebrae: No acute fracture. No primary bone lesion or focal pathologic process. Soft tissues and spinal canal: No prevertebral fluid or swelling. No visible canal hematoma. Disc levels: Multilevel degenerate disc disease prominent at C3-C4, C4-C5 and C5-C6. C2-C3:  No significant finding. C3-C4: Disc osteophyte complex with mild  spinal canal stenosis. No significant neural foraminal stenosis. C4-C5: Disc osteophyte complex with mild spinal canal stenosis. No significant neural foraminal stenosis. C5-C6: Disc osteophyte complex with narrowing of spinal canal. Mild bilateral neural foraminal stenosis, right greater than the left. C6-C7:  No significant spinal canal or neural foraminal stenosis. C7-T1:  No significant spinal canal or neural foraminal stenosis. Upper chest: Negative. Other: None IMPRESSION: 1. Acute bilateral subarachnoid hemorrhages predominantly along the left sylvian fissure, multiple small foci in the left frontal lobe. Linear foci of subarachnoid hemorrhage in the right temporal lobe and a small focus in the right frontal lobe. No significant edema or midline shift. Follow-up examination in 6 hours for stability is recommended. 2. 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 No acute cervical spine fracture. Above findings were reported to Dr. Starleen Blue at approximately 4:35 p.m. on 12/10/2021. Electronically Signed   By: Keane Police D.O.   On: 12/10/2021 16:58  ? ?CT CERVICAL SPINE WO CONTRAST ? ?Result Date: 12/10/2021 ?CLINICAL DATA:  Fall and loss of consciousness yesterday afternoon. Noted left-sided facial droop. EXAM: CT HEAD WITHOUT CONTRAST CT CERVICAL SPINE WITHOUT CONTRAST TECHNIQUE: Multidetector CT imaging of the head and cervical spine was performed following the standard protocol without intravenous contrast. Multiplanar CT image reconstructions of the cervical spine were also generated. RADIATION DOSE REDUCTION: This exam was performed according to the departmental dose-optimization program which includes automated exposure control, adjustment of the mA and/or kV according to patient size and/or use of iterative reconstruction technique. COMPARISON:  None. FINDINGS: CT HEAD FINDINGS Brain: There is acute subarachnoid hemorrhage along the left sylvian fissure and scattered foci of  subarachnoid hemorrhage in  the left frontal lobe. There is also linear hyperdense focus in the right medial temporal lobe (series 3, image 8-9). There is also small focus of hemorrhage in the right frontal lobe. There i

## 2021-12-11 NOTE — Progress Notes (Signed)
?  Transition of Care (TOC) Screening Note ? ? ?Patient Details  ?Name: Dawn Sherman ?Date of Birth: 06-02-1956 ? ? ?Transition of Care (TOC) CM/SW Contact:    ?Shelbie Hutching, RN ?Phone Number: ?12/11/2021, 3:16 PM ? ? ? ?Transition of Care Department [facility]) has reviewed patient and no TOC needs have been identified at this time. We will continue to monitor patient advancement through interdisciplinary progression rounds. If new patient transition needs arise, please place a TOC consult. ?  ?

## 2021-12-11 NOTE — Progress Notes (Signed)
PHARMACY CONSULT NOTE ? ?Pharmacy Consult for Electrolyte Monitoring and Replacement  ? ?Recent Labs: ?Potassium (mmol/L)  ?Date Value  ?12/11/2021 3.2 (L)  ? ?Magnesium (mg/dL)  ?Date Value  ?12/11/2021 2.4  ? ?Calcium (mg/dL)  ?Date Value  ?12/11/2021 7.3 (L)  ? ?Albumin (g/dL)  ?Date Value  ?12/10/2021 4.1  ? ?Phosphorus (mg/dL)  ?Date Value  ?12/11/2021 3.1  ? ?Sodium (mmol/L)  ?Date Value  ?12/11/2021 132 (L)  ?09/05/2020 134  ? ? ? ?Assessment: 66 y.o. female w/ PMH of on Eliquis for history of A-fib also with history of  tobacco use, prior alcohol use, CKD, HTN, CHF, paroxysmal AFib presents to the ER after a fall and SAH. Pharmacy is asked to follow and replace electrolytes ? ?Goal of Therapy:  ?Electrolytes WNL ? ?Plan:  ?Oral KCl 40 mEq x 1 ?Recheck electrolytes in am ? ?Dallie Piles ,PharmD ?Clinical Pharmacist ?12/11/2021 11:30 AM ? ?

## 2021-12-11 NOTE — Consult Note (Signed)
? ?Referring Physician:  ?No referring provider defined for this encounter. ? ?Primary Physician:  ?[name], MD ? ?Chief Complaint:  fall ? ?History of Present Illness: ?12/11/2021 ?Dawn Sherman is a 66 y.o. female who presents with the chief complaint of fall.  She feels that she is currently under baseline state of health.  She has been having some trouble with generalized fatigue and weakness.  She had a fall in the day prior to presenting to the emergency department.  She was noted to have some facial bruising when she presented to the emergency department. ? ?She denies any headache, nausea, or vomiting.  She is in the intensive care unit currently with additional medical issues that are currently being managed by internal medicine. Her cardiologist has also been contacted for management discussions. ? ?Review of Systems:  ?A 10 point review of systems is negative, except for the pertinent positives and negatives detailed in the HPI. ? ?Past Medical History: ?Past Medical History:  ?Diagnosis Date  ? B12 deficiency   ? CHF (congestive heart failure) ([place])   ? CKD (chronic kidney disease), stage III ([place])   ? Depression   ? Endometriosis   ? Hypertension   ? Mixed hyperlipidemia   ? Tobacco abuse   ? ? ?Past Surgical History: ?Past Surgical History:  ?Procedure Laterality Date  ? ABDOMINAL HYSTERECTOMY    ? BREAST BIOPSY Right 2015  ? neg-  FIBROADENOMA  ? TAH/RSO  1999  ? secondary to bleeding and endometriosis (Dr Vernie Ammons)  ? ? ?Allergies: ?Allergies as of 12/10/2021 - Review Complete 12/10/2021  ?Allergen Reaction Noted  ? Amoxicillin  07/02/2012  ? Benicar [olmesartan]  07/02/2012  ? Codeine sulfate  07/02/2012  ? Morphine and related  07/02/2012  ? Penicillins  07/02/2012  ? Sulfate  07/02/2012  ? Clindamycin/lincomycin Rash 10/15/2012  ? ? ?Medications: ? ?Current Facility-Administered Medications:  ?   stroke: early stages of recovery book, , Does not apply, Once, Para Skeans, MD ?  0.9 %   sodium chloride infusion, , Intravenous, Continuous, Para Skeans, MD, Last Rate: 50 mL/hr at 12/11/21 0700, Infusion Verify at 12/11/21 0700 ?  acetaminophen (TYLENOL) tablet 650 mg, 650 mg, Oral, Q6H PRN **OR** acetaminophen (TYLENOL) suppository 650 mg, 650 mg, Rectal, Q6H PRN, Para Skeans, MD ?  albuterol (PROVENTIL) (2.5 MG/3ML) 0.083% nebulizer solution 2.5 mg, 2.5 mg, Nebulization, Q6H, Florina Ou V, MD, 2.5 mg at 12/11/21 0230 ?  atorvastatin (LIPITOR) tablet 40 mg, 40 mg, Oral, QPM, Patel, Ekta V, MD ?  buPROPion (WELLBUTRIN XL) 24 hr tablet 150 mg, 150 mg, Oral, Daily, Florina Ou V, MD ?  Chlorhexidine Gluconate Cloth 2 % PADS 6 each, 6 each, Topical, Q0600, Para Skeans, MD, 6 each at 12/11/21 [phone] ?  fluticasone furoate-vilanterol (BREO ELLIPTA) 100-25 MCG/ACT 1 puff, 1 puff, Inhalation, Daily **AND** umeclidinium bromide (INCRUSE ELLIPTA) 62.5 MCG/ACT 1 puff, 1 puff, Inhalation, Daily, Posey Pronto, Gretta Cool, MD ?  hydrALAZINE (APRESOLINE) tablet 10 mg, 10 mg, Oral, TID, Posey Pronto, Gretta Cool, MD ?  ipratropium-albuterol (DUONEB) 0.5-2.5 (3) MG/3ML nebulizer solution 3 mL, 3 mL, Nebulization, Q4H PRN, Para Skeans, MD ?  ondansetron (ZOFRAN) tablet 4 mg, 4 mg, Oral, Q6H PRN **OR** ondansetron (ZOFRAN) injection 4 mg, 4 mg, Intravenous, Q6H PRN, Para Skeans, MD ?  venlafaxine XR (EFFEXOR-XR) 24 hr capsule 150 mg, 150 mg, Oral, Q breakfast, Para Skeans, MD ? ? ?Social History: ?Social History  ? ?Tobacco Use  ?  Smoking status: Every Day  ?  Packs/day: 0.25  ?  Years: 40.00  ?  Pack years: 10.00  ?  Types: Cigarettes  ? Smokeless tobacco: Never  ?Vaping Use  ? Vaping Use: Never used  ?Substance Use Topics  ? Alcohol use: Not Currently  ?  Alcohol/week: 0.0 standard drinks  ?  Comment: occasional  ? Drug use: No  ? ? ?Family Medical History: ?Family History  ?Problem Relation Age of Onset  ? Hypercholesterolemia Mother   ? Rheum arthritis Father   ? Rheum arthritis Daughter   ? Fibromyalgia Daughter   ? Breast  cancer Neg Hx   ? Colon cancer Neg Hx   ? ? ?Physical Examination: ?Vitals:  ? 12/11/21 0500 12/11/21 0600  ?BP: (!) 127/41 (!) 135/45  ?Pulse: (!) 42 (!) 41  ?Resp: 13 13  ?Temp:    ?SpO2: 96% 96%  ? ? ? ?General: Patient is well developed, well nourished, calm, collected, and in no apparent distress. ? ?Psychiatric: Patient is non-anxious. ? ?Head:  Pupils equal, round, and reactive to light. Bruising around L eye. ? ?ENT:  Oral mucosa appears well hydrated. ? ?Neck:   Supple.  Full range of motion. ? ?Respiratory: Patient is breathing without any difficulty. ? ?Extremities: No edema. ? ?Vascular: Palpable pulses in dorsal pedal vessels. ? ?Skin:   On exposed skin, there are no abnormal skin lesions. ? ?NEUROLOGICAL:  ?General: In no acute distress.   ?Awake, alert, oriented to person, place, and time.  Pupils equal round and reactive to light.  Facial tone is symmetric.  Tongue protrusion is midline.  There is no pronator drift. ? ? ?Strength: ?Side Biceps Triceps Deltoid Interossei Grip Wrist Ext. Wrist Flex.  ?R 5 5 5 5 5 5 5   ?L 5 5 5 5 5 5 5   ? ?Side Iliopsoas Quads Hamstring PF DF EHL  ?R 5 5 5 5 5 5   ?L 5 5 5 5 5 5   ? ? ?Bilateral upper and lower extremity sensation is intact to light touch. ?Reflexes are 1+ and symmetric at the biceps, triceps, brachioradialis, patella and achilles. Hoffman's is absent. ? ?Clonus is not present.  Toes are down-going.   ? ?Gait is untested. ? ?Imaging: ?MR Angio 12/10/21 ?IMPRESSION: ?Normal intracranial MRA. ?  ?  ?Electronically Signed ?  By: Ulyses Jarred M.D. ?  On: 12/10/2021 20:20 ? ?CT Head 12/10/21 ?IMPRESSION: ?1. Acute bilateral subarachnoid hemorrhages predominantly along the ?left sylvian fissure, multiple small foci in the left frontal lobe. ?Linear foci of subarachnoid hemorrhage in the right temporal lobe ?and a small focus in the right frontal lobe. No significant edema or ?midline shift. Follow-up examination in 6 hours for stability is ?recommended. ?  ?2.  Advanced multilevel degenerative disc disease of the cervical ?spine. No acute cervical spine fracture. ?  ?Above findings were reported to Dr. Starleen Blue at approximately 4:35 ?p.m. on 12/10/2021. ?  ?  ?Electronically Signed ?  By: Keane Police D.O. ?  On: 12/10/2021 16:58 ?I have personally reviewed the images and agree with the above interpretation. ? ?Labs: ? ?  Latest Ref Rng & Units 12/10/2021  ?  4:06 PM 09/10/2021  ? 12:34 PM 08/27/2021  ?  4:33 AM  ?CBC  ?WBC 4.0 - 10.5 K/uL 13.8   9.5   13.4    ?Hemoglobin 12.0 - 15.0 g/dL 13.8   11.9   12.2    ?Hematocrit 36.0 - 46.0 % 41.0  38.1   36.8    ?Platelets 150 - 400 K/uL 44   204.0   253    ? ? ? ? ? ?Assessment and Plan: ?Ms. Blow is a pleasant 66 y.o. female with traumatic intracranial hemorrhage.  She is on therapeutic anticoagulation and has new thrombocytopenia. ? ?At this point, I would recommend against therapeutic anticoagulation.  If required for other medical reasons, would recommend repeating her head CT when she reaches therapeutic anticoagulation. ? ?I do not think she needs further imaging unless she has a change in mental status.  I also do not feel that she will need long-term neurosurgical follow-up given her reassuring MR angiogram and low level of intracranial hemorrhage.  I recommend against antiepileptic medication at this time. ? ?I will leave further management to her primary team. ? ? ?Kalley Nicholl K. Izora Ribas MD, [place] ?Dept. of Neurosurgery ?  ? ?

## 2021-12-11 NOTE — Evaluation (Signed)
Occupational Therapy Evaluation ?Patient Details ?Name: Dawn Sherman ?MRN: [id number] ?DOB: 03/31/1956 ?Today's Date: 12/11/2021 ? ? ?History of Present Illness 66 y.o. female with a hx of tobacco use, prior alcohol use, CKD, HTN, CHF, paroxysmal AFib with new onset of SAH .  ? ?Clinical Impression ?  ?Patient presenting with decreased Ind in self care, balance, functional mobility/transfers, endurance, and safety awareness. Patient reports living at home with spouse and 67 y/o grandson. She is independent at baseline with self care and functional mobility. She reports using SPC since falling recently at home. Patient currently functioning at supervision overall for safety awareness with functional mobility and self care tasks this session. Patient will benefit from acute OT to increase overall independence in the areas of ADLs, functional mobility, and safety awareness in order to safely discharge to next venue of care.  ?   ? ?Recommendations for follow up therapy are one component of a multi-disciplinary discharge planning process, led by the attending physician.  Recommendations may be updated based on patient status, additional functional criteria and insurance authorization.  ? ?Follow Up Recommendations ? No OT follow up  ?  ?Assistance Recommended at Discharge PRN  ?   ?Functional Status Assessment ? Patient has not had a recent decline in their functional status  ?Equipment Recommendations ? None recommended by OT  ?  ?   ?Precautions / Restrictions Precautions ?Precautions: Fall  ? ?  ? ?Mobility Bed Mobility ?Overal bed mobility: Modified Independent ?  ?  ?  ?  ?  ?  ?  ?  ? ?Transfers ?Overall transfer level: Needs assistance ?Equipment used: None ?Transfers: Sit to/from Stand, Bed to chair/wheelchair/BSC ?Sit to Stand: Modified independent (Device/Increase time) ?  ?  ?Step pivot transfers: Supervision ?  ?  ?  ?  ? ?  ?Balance Overall balance assessment: Needs assistance, Modified  Independent ?Sitting-balance support: Feet supported ?Sitting balance-Leahy Scale: Normal ?  ?  ?Standing balance support: During functional activity ?Standing balance-Leahy Scale: Good ?  ?  ?  ?  ?  ?  ?  ?  ?  ?  ?  ?  ?   ? ?ADL either performed or assessed with clinical judgement  ? ?ADL Overall ADL's : Needs assistance/impaired ?  ?  ?  ?  ?  ?  ?  ?  ?  ?  ?  ?  ?  ?  ?  ?  ?  ?  ?  ?General ADL Comments: supervision overall for safety without use of RW  ? ? ? ?Vision Patient Visual Report: No change from baseline ?   ?   ?   ?   ? ?Pertinent Vitals/Pain Pain Assessment ?Pain Assessment: No/denies pain  ? ? ? ?Hand Dominance Right ?  ?Extremity/Trunk Assessment Upper Extremity Assessment ?Upper Extremity Assessment: Overall WFL for tasks assessed ?  ?Lower Extremity Assessment ?Lower Extremity Assessment: Overall WFL for tasks assessed ?  ?  ?  ?Communication Communication ?Communication: No difficulties ?  ?Cognition Arousal/Alertness: Awake/alert ?Behavior During Therapy: [facility] for tasks assessed/performed ?Overall Cognitive Status: Within Functional Limits for tasks assessed ?  ?  ?  ?  ?  ?  ?  ?  ?  ?  ?  ?  ?  ?  ?  ?  ?General Comments: Pleasant and cooperative ?  ?  ?   ?   ?   ? ? ?Home Living Family/patient expects to be discharged to:: Private residence ?Living  Arrangements: Spouse/significant other;Other relatives ?Available Help at Discharge: Family;Available 24 hours/day ?Type of Home: House ?Home Access: Stairs to enter ?Entrance Stairs-Number of Steps: 3-4 ?Entrance Stairs-Rails: None ?Home Layout: One level ?  ?  ?Bathroom Shower/Tub: Tub/shower unit ?  ?Bathroom Toilet: Standard ?  ?  ?Home Equipment: Kasandra Knudsen - single point ?  ?  ?  ? ?  ?Prior Functioning/Environment Prior Level of Function : Independent/Modified Independent ?  ?  ?  ?  ?  ?  ?  ?  ?  ? ?  ?  ?OT Problem List: Decreased strength;Decreased activity tolerance;Impaired balance (sitting and/or standing);Decreased safety awareness ?   ?   ?OT Treatment/Interventions: Self-care/ADL training;Therapeutic exercise;Patient/family education;Balance training;Energy conservation;Therapeutic activities  ?  ?OT Goals(Current goals can be found in the care plan section) Acute Rehab OT Goals ?Patient Stated Goal: to return home ?OT Goal Formulation: With patient ?Time For Goal Achievement: 12/25/21 ?Potential to Achieve Goals: Good ?ADL Goals ?Pt Will Perform Grooming: Independently ?Pt Will Perform Lower Body Dressing: Independently ?Pt Will Transfer to Toilet: Independently ?Pt Will Perform Toileting - Clothing Manipulation and hygiene: Independently  ?OT Frequency: Min 2X/week ?  ? ?   ?AM-PAC OT "6 Clicks" Daily Activity     ?Outcome Measure Help from another person eating meals?: None ?Help from another person taking care of personal grooming?: None ?Help from another person toileting, which includes using toliet, bedpan, or urinal?: None ?Help from another person bathing (including washing, rinsing, drying)?: None ?Help from another person to put on and taking off regular upper body clothing?: None ?Help from another person to put on and taking off regular lower body clothing?: None ?6 Click Score: 24 ?  ?End of Session Nurse Communication: Mobility status ? ?Activity Tolerance: Patient tolerated treatment well ?Patient left: Other (comment) (transitioned to PT evaluation) ? ?OT Visit Diagnosis: Unsteadiness on feet (R26.81);Repeated falls (R29.6);Muscle weakness (generalized) (M62.81)  ?              ?Time: 2956-2130 ?OT Time Calculation (min): 10 min ?Charges:  OT General Charges ?$OT Visit: 1 Visit ?OT Evaluation ?$OT Eval Low Complexity: 1 Low ? ?Darleen Crocker, [place], OTR/L , CBIS ?ascom [phone]  ?12/11/21, 12:38 PM  ?

## 2021-12-11 NOTE — Progress Notes (Signed)
*  PRELIMINARY RESULTS* ?Echocardiogram ?2D Echocardiogram has been performed. ? ?Tabor Bartram, [place] ?12/11/2021, 9:54 AM ?

## 2021-12-11 NOTE — Progress Notes (Signed)
SLP Cancellation Note ? ?Patient Details ?Name: Dawn Sherman ?MRN: [id number] ?DOB: 1955-11-09 ? ? ?Cancelled treatment:       Reason Eval/Treat Not Completed: SLP screened, no needs identified, will sign off (chart reviewed; consulted NSG then met w/ pt during Echocardiogram) ?Pt denied any difficulty swallowing and is currently on a regular diet; tolerates swallowing pills w/ water per NSG and pt reports. Pt conversed in general conversation w/out overt expressive/receptive deficits noted; pt denied any speech-language deficits stating she "felt" at her baseline. Noted she chuckled often during conversation (re: her kidney issues) but remained on topic and maintained topics. Speech intelligible,clear/ Noted a sore spot in left corner of lip/mouth post fall and "striking the left side of her her head".  ?No further skilled ST services indicated as pt appears at her baseline. Recommend f/u w/ PCP if any noted change post returning home to her ADLs. Pt agreed. NSG to reconsult if any change in status while admitted.   ? ? ? ? ?Orinda Kenner, MS, CCC-SLP ?Speech Language Pathologist ?Rehab Services; [place] ?[phone] (ascom) ?Elzy Tomasello ?12/11/2021, 10:09 AM ?

## 2021-12-11 NOTE — Evaluation (Signed)
Physical Therapy Evaluation ?Patient Details ?Name: Dawn Sherman ?MRN: [id number] ?DOB: 1956/06/07 ?Today's Date: 12/11/2021 ? ?History of Present Illness ? 66 y.o. female with a hx of tobacco use, prior alcohol use, CKD, HTN, CHF, paroxysmal AFib with new onset of SAH .  ?Clinical Impression ? Pt received via handoff from OT while ambulating around the ICU station.  Pt continued to ambulate around the nursing station, and was challenged with picking up gloves from the floor.  Pt performed well with no LOB, however at times has a slight bit of instability that the pt attributes to the neuropathy in her feet.  Pt notes she would likely walk better without pain if she had shoes.  Pt then transferred back to bed where she performed bed-level exercises with good technique.  Recommendations discussed with pt, and she feels as though once her BP is under control, she will be doing much better and not require any assistance.  Current discharge plans to home with no PT remain appropriate at this time.  Pt will continue to benefit from skilled therapy in order to address deficits listed below. ?   ?   ? ?Recommendations for follow up therapy are one component of a multi-disciplinary discharge planning process, led by the attending physician.  Recommendations may be updated based on patient status, additional functional criteria and insurance authorization. ? ?Follow Up Recommendations No PT follow up ? ?  ?Assistance Recommended at Discharge PRN  ?Patient can return home with the following ?   ? ?  ?Equipment Recommendations None recommended by PT  ?Recommendations for Other Services ?    ?  ?Functional Status Assessment Patient has had a recent decline in their functional status and demonstrates the ability to make significant improvements in function in a reasonable and predictable amount of time.  ? ?  ?Precautions / Restrictions Precautions ?Precautions: Fall ?Restrictions ?Weight Bearing Restrictions: No  ? ?   ? ?Mobility ? Bed Mobility ?Overal bed mobility: Modified Independent ?  ?  ?  ?  ?  ?  ?  ?  ? ?Transfers ?Overall transfer level: Needs assistance ?Equipment used: None ?Transfers: Sit to/from Stand ?Sit to Stand: Modified independent (Device/Increase time) ?  ?  ?  ?  ?  ?  ?  ? ?Ambulation/Gait ?Ambulation/Gait assistance: Modified independent (Device/Increase time) ?Gait Distance (Feet): 160 Feet ?Assistive device: None ?Gait Pattern/deviations: WFL(Within Functional Limits) ?Gait velocity: decreased ?  ?  ?General Gait Details: Pt does have some instability at times which she attributes to the neuropathy in her feet.  Pt notes she would walk better if she had shoes, but only has flip flops in the hospital. ? ?Stairs ?  ?  ?  ?  ?  ? ?Wheelchair Mobility ?  ? ?Modified Rankin (Stroke Patients Only) ?  ? ?  ? ?Balance Overall balance assessment: Needs assistance, Modified Independent ?Sitting-balance support: Feet supported ?Sitting balance-Leahy Scale: Normal ?  ?  ?Standing balance support: During functional activity ?Standing balance-Leahy Scale: Good ?  ?  ?  ?  ?  ?  ?  ?  ?  ?  ?  ?  ?   ? ? ? ?Pertinent Vitals/Pain Pain Assessment ?Pain Assessment: No/denies pain  ? ? ?Home Living Family/patient expects to be discharged to:: Private residence ?Living Arrangements: Spouse/significant other;Other relatives ?Available Help at Discharge: Family;Available 24 hours/day ?Type of Home: House ?Home Access: Stairs to enter ?Entrance Stairs-Rails: None ?Entrance Stairs-Number of Steps: 3-4 ?  ?Home  Layout: One level ?Home Equipment: Kasandra Knudsen - single point ?   ?  ?Prior Function Prior Level of Function : Independent/Modified Independent ?  ?  ?  ?  ?  ?  ?  ?  ?  ? ? ?Hand Dominance  ? Dominant Hand: Right ? ?  ?Extremity/Trunk Assessment  ? Upper Extremity Assessment ?Upper Extremity Assessment: Overall WFL for tasks assessed ?  ? ?Lower Extremity Assessment ?Lower Extremity Assessment: Overall WFL for tasks  assessed ?  ? ?   ?Communication  ? Communication: No difficulties  ?Cognition Arousal/Alertness: Awake/alert ?Behavior During Therapy: [facility] for tasks assessed/performed ?Overall Cognitive Status: Within Functional Limits for tasks assessed ?  ?  ?  ?  ?  ?  ?  ?  ?  ?  ?  ?  ?  ?  ?  ?  ?General Comments: Pleasant and cooperative ?  ?  ? ?  ?General Comments   ? ?  ?Exercises Total Joint Exercises ?Ankle Circles/Pumps: AROM, Strengthening, Both, 10 reps, Supine ?Quad Sets: AROM, Strengthening, Both, 10 reps, Supine ?Gluteal Sets: AROM, Strengthening, Both, 10 reps, Supine ?Hip ABduction/ADduction: AROM, Strengthening, Both, 10 reps, Supine ?Straight Leg Raises: AROM, Strengthening, Both, 10 reps, Supine ?Long Arc Quad: AROM, Strengthening, Both, 10 reps, Seated  ? ?Assessment/Plan  ?  ?PT Assessment Patient needs continued PT services  ?PT Problem List Decreased strength;Decreased activity tolerance;Decreased balance;Decreased mobility;Decreased safety awareness ? ?   ?  ?PT Treatment Interventions Gait training;Stair training;Functional mobility training;Therapeutic activities;Therapeutic exercise;Balance training;Neuromuscular re-education   ? ?PT Goals (Current goals can be found in the Care Plan section)  ?Acute Rehab PT Goals ?Patient Stated Goal: to get better and go home. ?PT Goal Formulation: With patient ?Time For Goal Achievement: 12/25/21 ?Potential to Achieve Goals: Good ? ?  ?Frequency Min 2X/week ?  ? ? ?Co-evaluation   ?  ?  ?  ?  ? ? ?  ?AM-PAC PT "6 Clicks" Mobility  ?Outcome Measure Help needed turning from your back to your side while in a flat bed without using bedrails?: A Little ?Help needed moving from lying on your back to sitting on the side of a flat bed without using bedrails?: A Little ?Help needed moving to and from a bed to a chair (including a wheelchair)?: A Little ?Help needed standing up from a chair using your arms (e.g., wheelchair or bedside chair)?: A Little ?Help needed to walk  in hospital room?: A Little ?Help needed climbing 3-5 steps with a railing? : A Little ?6 Click Score: 18 ? ?  ?End of Session Equipment Utilized During Treatment: Gait belt ?Activity Tolerance: Patient tolerated treatment well ?Patient left: in bed;with call bell/phone within reach ?Nurse Communication: Mobility status ?PT Visit Diagnosis: Unsteadiness on feet (R26.81);Other abnormalities of gait and mobility (R26.89);History of falling (Z91.81);Difficulty in walking, not elsewhere classified (R26.2) ?  ? ?Time: 1761-6073 ?PT Time Calculation (min) (ACUTE ONLY): 14 min ? ? ?Charges:   PT Evaluation ?$PT Eval Low Complexity: 1 Low ?PT Treatments ?$Therapeutic Exercise: 8-22 mins ?  ?   ? ? ?Gwenlyn Saran, PT, DPT ?12/11/21, 1:02 PM ? ? ?Christie Nottingham ?12/11/2021, 12:58 PM ? ?

## 2021-12-11 NOTE — Consult Note (Addendum)
? ?Hematology/Oncology Consult note ?[place] ?[phone] [phone] ? ?Patient Care Team: ?[name], MD as PCP - General (Internal Medicine) ?Minna Merritts, MD as PCP - Cardiology (Cardiology) ?Robert Bellow, MD as Consulting Physician (General Surgery)  ? ?Name of the patient: Dawn Sherman  ?[id number]  ?1955-12-11  ? ? ?Reason for consult: Thrombocytopenia ?  ?Requesting physician: Dr. Fritzi Mandes ? ?Date of visit: 12/11/2021 ? ? ? ?History of presenting illness- Patient is a 66 year old female with a past medical history significant for hypertension, A-fib on Eliquis, CKD who was admitted to the ER after she had a fall.  Patient experienced some generalized weakness and dizziness prior to the fall and felt like she blacked out and fell to the floor hitting the left side of her head and face.  She had a CT head without contrast which showed acute bilateral subarachnoid hemorrhages predominantly along the left sylvian fissure and multiple small foci in the left frontal lobe.  Linear focus of subarachnoid hemorrhage in the right temporal lobe and right frontal lobe.  No significant edema or midline shift.  Subsequent MRI angio head without contrast showed normal intracranial MRA.  Patient was seen by neurosurgery and no surgical intervention is recommended. ? ?Eliquis was held.  Patient was noted to have a platelet count of 44 on admission which dropped down to 28 this morning.  Her prior platelet counts including 1 from January 2023 was normal at 204.  White cell count mildly elevated at 14 with an H&H of 11.5/34.1. ? ?The only new drug that patient was started on was [place].  Patient denies any use of over-the-counter medications or herbal supplements. ? ? ? ?Pain scale- 0 ? ? ?Review of systems- Review of Systems  ?Constitutional:  Positive for malaise/fatigue. Negative for chills, fever and weight loss.  ?HENT:  Negative for congestion, ear discharge  and nosebleeds.   ?Eyes:  Negative for blurred vision.  ?Respiratory:  Negative for cough, hemoptysis, sputum production, shortness of breath and wheezing.   ?Cardiovascular:  Negative for chest pain, palpitations, orthopnea and claudication.  ?Gastrointestinal:  Negative for abdominal pain, blood in stool, constipation, diarrhea, heartburn, melena, nausea and vomiting.  ?Genitourinary:  Negative for dysuria, flank pain, frequency, hematuria and urgency.  ?Musculoskeletal:  Negative for back pain, joint pain and myalgias.  ?Skin:  Negative for rash.  ?Neurological:  Negative for dizziness, tingling, focal weakness, seizures, weakness and headaches.  ?Endo/Heme/Allergies:  Does not bruise/bleed easily.  ?Psychiatric/Behavioral:  Negative for depression and suicidal ideas. The patient does not have insomnia.   ? ?Allergies  ?Allergen Reactions  ? Sulfate Rash  ? Codeine Sulfate Nausea Only  ? Benicar [Olmesartan]   ?  Talked with patient February 10, 2020, intolerance is unclear, tried several medications around that time and one of them gave her a rash but she is not clear which 1.  ? Amoxicillin Rash  ? Clindamycin/Lincomycin Rash  ? Morphine And Related Rash  ? Penicillins Rash  ? ? ?Patient Active Problem List  ? Diagnosis Date Noted  ? Second degree AV block   ? Syncope and collapse 12/10/2021  ? SAH (subarachnoid hemorrhage) ([place]) 12/10/2021  ? Hypokalemia 12/10/2021  ? Hyponatremia 12/10/2021  ? 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 Atrial fibrillation with slow ventricular response ([place]) 12/10/2021  ? Hypotension 12/10/2021  ? Swelling of lower extremity 10/28/2021  ? Malnutrition of moderate degree 08/25/2021  ? Prediabetes 08/19/2021  ? Chronic obstructive pulmonary disease (HCC)  11/22/2020  ? CKD (chronic kidney disease) stage 3, GFR 30-59 ml/min (HCC) 08/12/2020  ? Paroxysmal A-fib ([place]) 08/12/2020  ? Dehydration   ? 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 Chronic combined systolic and diastolic CHF (congestive  heart failure) ([place]) 02/10/2020  ? HLD (hyperlipidemia) 02/10/2020  ? Elevated troponin 02/10/2020  ? GERD (gastroesophageal reflux disease) 02/10/2020  ? Abnormal mammogram 07/13/2019  ? Sleep concern 10/17/2018  ? History of alcohol abuse 07/31/2018  ? B12 deficiency 07/31/2018  ? Acute renal failure superimposed on stage 3b chronic kidney disease ([place]) 11/03/2016  ? Health care maintenance 12/02/2014  ? Tobacco abuse 12/02/2014  ? Foot pain, left 11/29/2014  ? Hypercholesterolemia 11/29/2014  ? Encounter for screening colonoscopy 12/16/2013  ? Breast mass 12/15/2012  ? Hypertension 07/05/2012  ? Depression, major, single episode, mild ([place]) 07/05/2012  ? ? ? ?Past Medical History:  ?Diagnosis Date  ? B12 deficiency   ? CHF (congestive heart failure) ([place])   ? CKD (chronic kidney disease), stage III ([place])   ? Depression   ? Endometriosis   ? Hypertension   ? Mixed hyperlipidemia   ? Tobacco abuse   ? ? ? ?Past Surgical History:  ?Procedure Laterality Date  ? ABDOMINAL HYSTERECTOMY    ? BREAST BIOPSY Right 2015  ? neg-  FIBROADENOMA  ? TAH/RSO  1999  ? secondary to bleeding and endometriosis (Dr Vernie Ammons)  ? ? ?Social History  ? ?Socioeconomic History  ? Marital status: Married  ?  Spouse name: Not on file  ? Number of children: Not on file  ? Years of education: Not on file  ? Highest education level: Not on file  ?Occupational History  ? Not on file  ?Tobacco Use  ? Smoking status: Every Day  ?  Packs/day: 0.25  ?  Years: 40.00  ?  Pack years: 10.00  ?  Types: Cigarettes  ? Smokeless tobacco: Never  ?Vaping Use  ? Vaping Use: Never used  ?Substance and Sexual Activity  ? Alcohol use: Not Currently  ?  Alcohol/week: 0.0 standard drinks  ?  Comment: occasional  ? Drug use: No  ? Sexual activity: Not Currently  ?Other Topics Concern  ? Not on file  ?Social History Narrative  ? Lives in [place] with her husband.  She is retired from [employer].  She does not routinely exercise.  ? ?Social Determinants  of Health  ? ?Financial Resource Strain: Low Risk   ? Difficulty of Paying Living Expenses: Not hard at all  ?Food Insecurity: No Food Insecurity  ? Worried About Charity fundraiser in the Last Year: Never true  ? Ran Out of Food in the Last Year: Never true  ?Transportation Needs: No Transportation Needs  ? Lack of Transportation (Medical): No  ? Lack of Transportation (Non-Medical): No  ?Physical Activity: Sufficiently Active  ? Days of Exercise per Week: 7 days  ? Minutes of Exercise per Session: 30 min  ?Stress: No Stress Concern Present  ? Feeling of Stress : Not at all  ?Social Connections: Unknown  ? Frequency of Communication with Friends and Family: Not on file  ? Frequency of Social Gatherings with Friends and Family: Not on file  ? Attends Religious Services: Not on file  ? Active Member of Clubs or Organizations: Not on file  ? Attends Archivist Meetings: Not on file  ? Marital Status: Married  ?Intimate Partner Violence: Not At Risk  ? Fear of Current or  Ex-Partner: No  ? Emotionally Abused: No  ? Physically Abused: No  ? Sexually Abused: No  ? ?  ?Family History  ?Problem Relation Age of Onset  ? Hypercholesterolemia Mother   ? Rheum arthritis Father   ? Rheum arthritis Daughter   ? Fibromyalgia Daughter   ? Breast cancer Neg Hx   ? Colon cancer Neg Hx   ? ? ? ?Current Facility-Administered Medications:  ?   stroke: early stages of recovery book, , Does not apply, Once, Para Skeans, MD ?  0.9 %  sodium chloride infusion, , Intravenous, Continuous, Fritzi Mandes, MD, Last Rate: 85 mL/hr at 12/11/21 1453, New Bag at 12/11/21 1453 ?  acetaminophen (TYLENOL) tablet 650 mg, 650 mg, Oral, Q6H PRN **OR** acetaminophen (TYLENOL) suppository 650 mg, 650 mg, Rectal, Q6H PRN, Para Skeans, MD ?  acetaminophen (TYLENOL) tablet 650 mg, 650 mg, Oral, Daily, Noralee Space, RPH, 650 mg at 12/11/21 1450 ?  atorvastatin (LIPITOR) tablet 40 mg, 40 mg, Oral, QPM, Patel, Ekta V, MD ?  buPROPion  (WELLBUTRIN XL) 24 hr tablet 150 mg, 150 mg, Oral, Daily, Florina Ou V, MD, 150 mg at 12/11/21 0911 ?  Chlorhexidine Gluconate Cloth 2 % PADS 6 each, 6 each, Topical, Q0600, Para Skeans, MD, 6 each at 12/11/21 [phone]

## 2021-12-12 ENCOUNTER — Inpatient Hospital Stay: Payer: Medicare Other

## 2021-12-12 DIAGNOSIS — I480 Paroxysmal atrial fibrillation: Secondary | ICD-10-CM

## 2021-12-12 DIAGNOSIS — N179 Acute kidney failure, unspecified: Secondary | ICD-10-CM | POA: Diagnosis not present

## 2021-12-12 DIAGNOSIS — I609 Nontraumatic subarachnoid hemorrhage, unspecified: Secondary | ICD-10-CM | POA: Diagnosis not present

## 2021-12-12 DIAGNOSIS — R55 Syncope and collapse: Secondary | ICD-10-CM | POA: Diagnosis not present

## 2021-12-12 DIAGNOSIS — S0990XA Unspecified injury of head, initial encounter: Secondary | ICD-10-CM | POA: Diagnosis not present

## 2021-12-12 LAB — HEPATITIS PANEL, ACUTE
HCV Ab: NONREACTIVE
Hep A IgM: NONREACTIVE
Hep B C IgM: NONREACTIVE
Hepatitis B Surface Ag: NONREACTIVE

## 2021-12-12 LAB — MAGNESIUM: Magnesium: 2.1 mg/dL (ref 1.7–2.4)

## 2021-12-12 LAB — BASIC METABOLIC PANEL
Anion gap: 7 (ref 5–15)
BUN: 53 mg/dL — ABNORMAL HIGH (ref 8–23)
CO2: 14 mmol/L — ABNORMAL LOW (ref 22–32)
Calcium: 7.2 mg/dL — ABNORMAL LOW (ref 8.9–10.3)
Chloride: 107 mmol/L (ref 98–111)
Creatinine, Ser: 2.09 mg/dL — ABNORMAL HIGH (ref 0.44–1.00)
GFR, Estimated: 26 mL/min — ABNORMAL LOW (ref >60)
Glucose, Bld: 195 mg/dL — ABNORMAL HIGH (ref 70–99)
Potassium: 3.3 mmol/L — ABNORMAL LOW (ref 3.5–5.1)
Sodium: 128 mmol/L — ABNORMAL LOW (ref 135–145)

## 2021-12-12 LAB — CBC
HCT: 29.5 % — ABNORMAL LOW (ref 36.0–46.0)
Hemoglobin: 9.9 g/dL — ABNORMAL LOW (ref 12.0–15.0)
MCH: 26.8 pg (ref 26.0–34.0)
MCHC: 33.6 g/dL (ref 30.0–36.0)
MCV: 79.7 fL — ABNORMAL LOW (ref 80.0–100.0)
Platelets: 64 10*3/uL — ABNORMAL LOW (ref 150–400)
RBC: 3.7 MIL/uL — ABNORMAL LOW (ref 3.87–5.11)
RDW: 16.4 % — ABNORMAL HIGH (ref 11.5–15.5)
WBC: 14.1 10*3/uL — ABNORMAL HIGH (ref 4.0–10.5)
nRBC: 0 % (ref 0.0–0.2)

## 2021-12-12 LAB — PREPARE PLATELET PHERESIS: Unit division: 0

## 2021-12-12 LAB — IRON AND TIBC
Iron: 76 ug/dL (ref 28–170)
Saturation Ratios: 25 % (ref 10.4–31.8)
TIBC: 302 ug/dL (ref 250–450)
UIBC: 226 ug/dL

## 2021-12-12 LAB — BPAM PLATELET PHERESIS
Blood Product Expiration Date: 202304202359
ISSUE DATE / TIME: 202304182042
Unit Type and Rh: 6200

## 2021-12-12 LAB — FERRITIN: Ferritin: 16 ng/mL (ref 11–307)

## 2021-12-12 LAB — HIV ANTIBODY (ROUTINE TESTING W REFLEX): HIV Screen 4th Generation wRfx: NONREACTIVE

## 2021-12-12 MED ORDER — POTASSIUM CHLORIDE CRYS ER 20 MEQ PO TBCR
40.0000 meq | EXTENDED_RELEASE_TABLET | Freq: Once | ORAL | Status: AC
  Administered 2021-12-12: 40 meq via ORAL
  Filled 2021-12-12: qty 2

## 2021-12-12 MED ORDER — SODIUM CHLORIDE 0.9% IV SOLUTION: Freq: Once | INTRAVENOUS | Status: DC

## 2021-12-12 MED ORDER — SODIUM CHLORIDE 0.9 % IV SOLN: INTRAVENOUS | Status: DC

## 2021-12-12 NOTE — Progress Notes (Signed)
Occupational Therapy Treatment ?Patient Details ?Name: Dawn Sherman ?MRN: [id number] ?DOB: [birth year] ?Today's Date: 12/12/2021 ? ? ?History of present illness 66 y.o. female with a hx of tobacco use, prior alcohol use, CKD, HTN, CHF, paroxysmal AFib with new onset of SAH . ?  ?OT comments ? Upon entering the room, pt supine in bed and agreeable to OT intervention. Pt reports not sleeping well and giggles/cackles throughout session. Pt performs bed mobility without assistance. She stands with supervision and without use of AD. She ambulates with therapist managing lines and leads with supervision ~ 500' progressing to Mod I. Pt returning to room and taking seated rest break and then stands at sink for grooming tasks with supervision and set up A to obtain all needed items. Pt returns to bed at end of session. All needs within reach.   ? ?Recommendations for follow up therapy are one component of a multi-disciplinary discharge planning process, led by the attending physician.  Recommendations may be updated based on patient status, additional functional criteria and insurance authorization. ?   ?Follow Up Recommendations ? No OT follow up  ?  ?Assistance Recommended at Discharge PRN  ?Patient can return home with the following ? A little help with bathing/dressing/bathroom;Help with stairs or ramp for entrance;Assist for transportation;Assistance with cooking/housework ?  ?Equipment Recommendations ? None recommended by OT  ?  ?   ?Precautions / Restrictions Precautions ?Precautions: Fall ?Restrictions ?Weight Bearing Restrictions: No  ? ? ?  ? ?Mobility Bed Mobility ?Overal bed mobility: Modified Independent ?  ?  ?  ?  ?  ?  ?General bed mobility comments: no physical assistance ?  ? ?Transfers ?Overall transfer level: Needs assistance ?Equipment used: None ?Transfers: Sit to/from Stand ?Sit to Stand: Modified independent (Device/Increase time) ?  ?  ?Step pivot transfers: Supervision ?  ?  ?  ?  ?  ?Balance Overall  balance assessment: Needs assistance ?Sitting-balance support: Feet supported ?Sitting balance-Leahy Scale: Normal ?  ?  ?Standing balance support: During functional activity ?Standing balance-Leahy Scale: Good ?  ?  ?  ?  ?  ?  ?  ?  ?  ?  ?  ?  ?   ? ?ADL either performed or assessed with clinical judgement  ? ?ADL Overall ADL's : Needs assistance/impaired ?  ?  ?Grooming: Wash/dry hands;Wash/dry face;Oral care;Standing;Supervision/safety;Set up ?  ?  ?  ?  ?  ?  ?  ?  ?  ?  ?  ?  ?  ?  ?  ?  ?  ?  ? ?Extremity/Trunk Assessment Upper Extremity Assessment ?Upper Extremity Assessment: Overall WFL for tasks assessed ?  ?Lower Extremity Assessment ?Lower Extremity Assessment: Overall WFL for tasks assessed ?  ?  ?  ? ?Vision Patient Visual Report: No change from baseline ?  ?  ?   ?   ? ?Cognition Arousal/Alertness: Awake/alert ?Behavior During Therapy: [facility] for tasks assessed/performed ?Overall Cognitive Status: Within Functional Limits for tasks assessed ?  ?  ?  ?  ?  ?  ?  ?  ?  ?  ?  ?  ?  ?  ?  ?  ?General Comments: Pleasant and cooperative ?  ?  ?   ?   ?   ?   ? ? ?Pertinent Vitals/ Pain       Pain Assessment ?Pain Assessment: No/denies pain ? ?   ?   ? ?Frequency ? Min 2X/week  ? ? ? ? ?  ?  Progress Toward Goals ? ?OT Goals(current goals can now be found in the care plan section) ? Progress towards OT goals: Progressing toward goals ? ?Acute Rehab OT Goals ?Patient Stated Goal: to return home ?OT Goal Formulation: With patient ?Time For Goal Achievement: 12/25/21 ?Potential to Achieve Goals: Good  ?Plan Discharge plan remains appropriate;Frequency remains appropriate   ? ?   ?AM-PAC OT "6 Clicks" Daily Activity     ?Outcome Measure ? ? Help from another person eating meals?: None ?Help from another person taking care of personal grooming?: None ?Help from another person toileting, which includes using toliet, bedpan, or urinal?: None ?Help from another person bathing (including washing, rinsing, drying)?:  None ?Help from another person to put on and taking off regular upper body clothing?: None ?Help from another person to put on and taking off regular lower body clothing?: None ?6 Click Score: 24 ? ?  ?End of Session   ? ?OT Visit Diagnosis: Unsteadiness on feet (R26.81);Repeated falls (R29.6);Muscle weakness (generalized) (M62.81) ?  ?Activity Tolerance Patient tolerated treatment well ?  ?Patient Left in bed;with call bell/phone within reach;with bed alarm set ?  ?Nurse Communication Mobility status ?  ? ?   ? ?Time: 4712-5271 ?OT Time Calculation (min): 25 min ? ?Charges: OT General Charges ?$OT Visit: 1 Visit ?OT Treatments ?$Self Care/Home Management : 8-22 mins ?$Therapeutic Activity: 8-22 mins ? ?Darleen Crocker, MS, OTR/L , CBIS ?ascom [phone]  ?12/12/21, 1:41 PM  ?

## 2021-12-12 NOTE — Progress Notes (Signed)
? ? ? ?Hematology/Oncology Consult note ?[facility]  ?[phone] [phone] ? ?Patient Care Team: ?[name], MD as PCP - General (Internal Medicine) ?Minna Merritts, MD as PCP - Cardiology (Cardiology) ?Robert Bellow, MD as Consulting Physician (General Surgery)  ? ?Name of the patient: Dawn Sherman  ?[id number]  ?[birth year]  ? ?Date of visit: 12/12/2021 ? ? ?Interval history-patient is sitting up in her bed and having her lunch.  Endorses no significant complaints at this time.  She reports feeling better since her admission.  Denies any bleeding. ? ? ? ?Review of systems- Review of Systems  ?Constitutional:  Positive for malaise/fatigue. Negative for chills, fever and weight loss.  ?HENT:  Negative for congestion, ear discharge and nosebleeds.   ?Eyes:  Negative for blurred vision.  ?Respiratory:  Negative for cough, hemoptysis, sputum production, shortness of breath and wheezing.   ?Cardiovascular:  Negative for chest pain, palpitations, orthopnea and claudication.  ?Gastrointestinal:  Negative for abdominal pain, blood in stool, constipation, diarrhea, heartburn, melena, nausea and vomiting.  ?Genitourinary:  Negative for dysuria, flank pain, frequency, hematuria and urgency.  ?Musculoskeletal:  Negative for back pain, joint pain and myalgias.  ?Skin:  Negative for rash.  ?Neurological:  Negative for dizziness, tingling, focal weakness, seizures, weakness and headaches.  ?Endo/Heme/Allergies:  Does not bruise/bleed easily.  ?Psychiatric/Behavioral:  Negative for depression and suicidal ideas. The patient does not have insomnia.    ? ? ? ?Allergies  ?Allergen Reactions  ? Sulfate Rash  ? Codeine Sulfate Nausea Only  ? Benicar [Olmesartan]   ?  Talked with patient February 10, 2020, intolerance is unclear, tried several medications around that time and one of them gave her a rash but she is not clear which 1.  ? Amoxicillin Rash  ? Clindamycin/Lincomycin Rash  ?  Morphine And Related Rash  ? Penicillins Rash  ? ? ? ?Past Medical History:  ?Diagnosis Date  ? B12 deficiency   ? CHF (congestive heart failure) ([place])   ? CKD (chronic kidney disease), stage III ([place])   ? Depression   ? Endometriosis   ? Hypertension   ? Mixed hyperlipidemia   ? Tobacco abuse   ? ? ? ?Past Surgical History:  ?Procedure Laterality Date  ? ABDOMINAL HYSTERECTOMY    ? BREAST BIOPSY Right 2015  ? neg-  FIBROADENOMA  ? TAH/RSO  1999  ? secondary to bleeding and endometriosis (Dr Vernie Ammons)  ? ? ?Social History  ? ?Socioeconomic History  ? Marital status: Married  ?  Spouse name: Not on file  ? Number of children: Not on file  ? Years of education: Not on file  ? Highest education level: Not on file  ?Occupational History  ? Not on file  ?Tobacco Use  ? Smoking status: Every Day  ?  Packs/day: 0.25  ?  Years: 40.00  ?  Pack years: 10.00  ?  Types: Cigarettes  ? Smokeless tobacco: Never  ?Vaping Use  ? Vaping Use: Never used  ?Substance and Sexual Activity  ? Alcohol use: Not Currently  ?  Alcohol/week: 0.0 standard drinks  ?  Comment: occasional  ? Drug use: No  ? Sexual activity: Not Currently  ?Other Topics Concern  ? Not on file  ?Social History Narrative  ? Lives in [place] with her husband.  She is retired from [employer].  She does not routinely exercise.  ? ?Social Determinants of Health  ? ?Financial Resource Strain: Low Risk   ?  Difficulty of Paying Living Expenses: Not hard at all  ?Food Insecurity: No Food Insecurity  ? Worried About Charity fundraiser in the Last Year: Never true  ? Ran Out of Food in the Last Year: Never true  ?Transportation Needs: No Transportation Needs  ? Lack of Transportation (Medical): No  ? Lack of Transportation (Non-Medical): No  ?Physical Activity: Sufficiently Active  ? Days of Exercise per Week: 7 days  ? Minutes of Exercise per Session: 30 min  ?Stress: No Stress Concern Present  ? Feeling of Stress : Not at all  ?Social Connections: Unknown  ?  Frequency of Communication with Friends and Family: Not on file  ? Frequency of Social Gatherings with Friends and Family: Not on file  ? Attends Religious Services: Not on file  ? Active Member of Clubs or Organizations: Not on file  ? Attends Archivist Meetings: Not on file  ? Marital Status: Married  ?Intimate Partner Violence: Not At Risk  ? Fear of Current or Ex-Partner: No  ? Emotionally Abused: No  ? Physically Abused: No  ? Sexually Abused: No  ? ? ?Family History  ?Problem Relation Age of Onset  ? Hypercholesterolemia Mother   ? Rheum arthritis Father   ? Rheum arthritis Daughter   ? Fibromyalgia Daughter   ? Breast cancer Neg Hx   ? Colon cancer Neg Hx   ? ? ? ?Current Facility-Administered Medications:  ?   stroke: early stages of recovery book, , Does not apply, Once, Para Skeans, MD ?  0.9 %  sodium chloride infusion (Manually program via Guardrails IV Fluids), , Intravenous, Once, Foust, Katy L, NP ?  acetaminophen (TYLENOL) tablet 650 mg, 650 mg, Oral, Q6H PRN **OR** acetaminophen (TYLENOL) suppository 650 mg, 650 mg, Rectal, Q6H PRN, Para Skeans, MD ?  atorvastatin (LIPITOR) tablet 40 mg, 40 mg, Oral, QPM, Para Skeans, MD, 40 mg at 12/11/21 1734 ?  buPROPion (WELLBUTRIN XL) 24 hr tablet 150 mg, 150 mg, Oral, Daily, Florina Ou V, MD, 150 mg at 12/12/21 0912 ?  Chlorhexidine Gluconate Cloth 2 % PADS 6 each, 6 each, Topical, Q0600, Para Skeans, MD, 6 each at 12/12/21 0615 ?  dexamethasone (DECADRON) tablet 40 mg, 40 mg, Oral, Daily, Sindy Guadeloupe, MD, 40 mg at 12/12/21 0912 ?  fluticasone furoate-vilanterol (BREO ELLIPTA) 100-25 MCG/ACT 1 puff, 1 puff, Inhalation, Daily, 1 puff at 12/12/21 0715 **AND** umeclidinium bromide (INCRUSE ELLIPTA) 62.5 MCG/ACT 1 puff, 1 puff, Inhalation, Daily, Para Skeans, MD, 1 puff at 12/12/21 0715 ?  hydrALAZINE (APRESOLINE) tablet 10 mg, 10 mg, Oral, TID, Para Skeans, MD, 10 mg at 12/12/21 0912 ?  Immune Globulin 10% (PRIVIGEN) 55 g, 55 g,  Intravenous, Q24 Hr x 2, Sindy Guadeloupe, MD, 55 g at 12/11/21 2130 ?  ipratropium-albuterol (DUONEB) 0.5-2.5 (3) MG/3ML nebulizer solution 3 mL, 3 mL, Nebulization, Q4H PRN, Para Skeans, MD ?  loratadine (CLARITIN) tablet 10 mg, 10 mg, Oral, Daily, Dallie Piles, RPH, 10 mg at 12/12/21 2878 ?  ondansetron (ZOFRAN) tablet 4 mg, 4 mg, Oral, Q6H PRN **OR** ondansetron (ZOFRAN) injection 4 mg, 4 mg, Intravenous, Q6H PRN, Para Skeans, MD ?  pantoprazole (PROTONIX) EC tablet 40 mg, 40 mg, Oral, Daily, Sindy Guadeloupe, MD, 40 mg at 12/12/21 6767 ?  venlafaxine XR (EFFEXOR-XR) 24 hr capsule 150 mg, 150 mg, Oral, Q breakfast, Florina Ou V, MD, 150 mg at 12/12/21 2094 ? ?Physical  exam:  ?Vitals:  ? 12/12/21 1200 12/12/21 1300 12/12/21 1400 12/12/21 1500  ?BP: 127/78 129/73 131/68 138/61  ?Pulse: 84 88 94 79  ?Resp: (!) 22 (!) 21 16   ?Temp:      ?TempSrc:      ?SpO2: 100% 100% 99% 100%  ?Weight:      ?Height:      ? ?Physical Exam ?Cardiovascular:  ?   Rate and Rhythm: Normal rate and regular rhythm.  ?   Heart sounds: Normal heart sounds.  ?Pulmonary:  ?   Effort: Pulmonary effort is normal.  ?   Breath sounds: Normal breath sounds.  ?Abdominal:  ?   General: Bowel sounds are normal.  ?   Palpations: Abdomen is soft.  ?Skin: ?   General: Skin is warm and dry.  ?   Comments: Bruising noted over left side of the face, lower lip as well as bilateral knees.  ?Neurological:  ?   Mental Status: She is alert and oriented to person, place, and time.  ?  ? ? ?  Latest Ref Rng & Units 12/12/2021  ?  4:34 AM  ?CMP  ?Glucose 70 - 99 mg/dL 195    ?BUN 8 - 23 mg/dL 53    ?Creatinine 0.44 - 1.00 mg/dL 2.09    ?Sodium 135 - 145 mmol/L 128    ?Potassium 3.5 - 5.1 mmol/L 3.3    ?Chloride 98 - 111 mmol/L 107    ?CO2 22 - 32 mmol/L 14    ?Calcium 8.9 - 10.3 mg/dL 7.2    ? ? ?  Latest Ref Rng & Units 12/12/2021  ?  6:54 AM  ?CBC  ?WBC 4.0 - 10.5 K/uL 14.1    ?Hemoglobin 12.0 - 15.0 g/dL 9.9    ?Hematocrit 36.0 - 46.0 % 29.5    ?Platelets 150  - 400 K/uL 64    ? ? ?@IMAGES @ ? ?CT ABDOMEN PELVIS WO CONTRAST ? ?Result Date: 12/10/2021 ?CLINICAL DATA:  Sepsis. EXAM: CT ABDOMEN AND PELVIS WITHOUT CONTRAST TECHNIQUE: Multidetector CT imaging of the abdom

## 2021-12-12 NOTE — Progress Notes (Signed)
[facility] at [facility] ? ? ?PATIENT NAME: Dawn Sherman   ? ?MR#:  [id number] ? ?DATE OF BIRTH:  1956/04/08 ? ?SUBJECTIVE:  ? ?no family the room. Overall doing well. Ambulated with occupational therapy in the hallways. Denies any headache. ?Received two units of platelet overnight and one dose of IV IG ? on PO Decadron ?VITALS:  ?Blood pressure 127/78, pulse 84, temperature 98 ?F (36.7 ?C), temperature source Oral, resp. rate (!) 22, height 5\' 5"  (1.651 m), weight 54 kg, SpO2 100 %. ? ?PHYSICAL EXAMINATION:  ? ?GENERAL:  66 y.o.-year-old patient lying in the bed with no acute distress. Facial bruise ?LUNGS: Normal breath sounds bilaterally, no wheezing, rales, rhonchi.  ?CARDIOVASCULAR: S1, S2 normal. No murmurs, rubs, or gallops.  ?ABDOMEN: Soft, nontender, nondistended. Bowel sounds present.  ?EXTREMITIES: No  edema b/l.    ?NEUROLOGIC: nonfocal  patient is alert and awake ?SKIN: bruise over the left knee ? ?LABORATORY PANEL:  ?CBC ?Recent Labs  ?Lab 12/12/21 ?0654  ?WBC 14.1*  ?HGB 9.9*  ?HCT 29.5*  ?PLT 64*  ? ? ? ?Chemistries  ?Recent Labs  ?Lab 12/10/21 ?1606 12/11/21 ?4098 12/12/21 ?1191  ?NA 130*   < > 128*  ?K 3.2*   < > 3.3*  ?CL 101   < > 107  ?CO2 18*   < > 14*  ?GLUCOSE 146*   < > 195*  ?BUN 60*   < > 53*  ?CREATININE 2.49*   < > 2.09*  ?CALCIUM 8.1*   < > 7.2*  ?MG 2.7*   < > 2.1  ?AST 16  --   --   ?ALT 12  --   --   ?ALKPHOS 79  --   --   ?BILITOT 0.4  --   --   ? < > = values in this interval not displayed.  ? ? ?Cardiac Enzymes ?No results for input(s): TROPONINI in the last 168 hours. ?RADIOLOGY:  ?CT ABDOMEN PELVIS WO CONTRAST ? ?Result Date: 12/10/2021 ?CLINICAL DATA:  Sepsis. EXAM: CT ABDOMEN AND PELVIS WITHOUT CONTRAST TECHNIQUE: Multidetector CT imaging of the abdomen and pelvis was performed following the standard protocol without IV contrast. RADIATION DOSE REDUCTION: This exam was performed according to the departmental dose-optimization program which includes  automated exposure control, adjustment of the mA and/or kV according to patient size and/or use of iterative reconstruction technique. COMPARISON:  None. FINDINGS: Motion degraded scan, limiting assessment. Lower chest: No significant pulmonary nodules or acute consolidative airspace disease. Hepatobiliary: Normal liver size. No liver mass. Poorly visualized region of the gallbladder fossa due to motion degradation, unable to assess if the gallbladder is collapsed or absent. No biliary ductal dilatation. Pancreas: Normal, with no mass or duct dilation. Spleen: Normal size. No mass. Adrenals/Urinary Tract: Normal adrenals. Severe asymmetric right renal atrophy. No hydronephrosis. No renal stones. No contour deforming renal masses. Normal bladder. Stomach/Bowel: Normal non-distended stomach. Normal caliber small bowel with no small bowel wall thickening. Normal appendix. Normal large bowel with no diverticulosis, large bowel wall thickening or pericolonic fat stranding. Vascular/Lymphatic: Atherosclerotic nonaneurysmal abdominal aorta. No pathologically enlarged lymph nodes in the abdomen or pelvis. Reproductive: Status post hysterectomy, with no abnormal findings at the vaginal cuff. No adnexal mass. Other: No pneumoperitoneum, ascites or focal fluid collection. Musculoskeletal: No aggressive appearing focal osseous lesions. Mild levocurvature of the lumbar spine. IMPRESSION: 1. Very limited motion degraded unenhanced scan. No acute abnormality. No evidence of bowel obstruction or acute bowel inflammation. Normal  appendix. 2. Severe asymmetric right renal atrophy. 3. Aortic Atherosclerosis (ICD10-I70.0). Electronically Signed   By: Ilona Sorrel M.D.   On: 12/10/2021 17:51  ? ?DG Chest 2 View ? ?Result Date: 12/10/2021 ?CLINICAL DATA:  Weakness for 3 days, fell, tobacco abuse EXAM: CHEST - 2 VIEW COMPARISON:  08/19/2021 FINDINGS: Frontal and lateral views of the chest demonstrate a stable cardiac silhouette. No acute  airspace disease, effusion, or pneumothorax. Prior healed left rib fractures. Stable right convex thoracolumbar scoliosis. IMPRESSION: 1. No acute intrathoracic process. Electronically Signed   By: Randa Ngo M.D.   On: 12/10/2021 17:39  ? ?CT HEAD WO CONTRAST (5MM) ? ?Result Date: 12/12/2021 ?CLINICAL DATA:  Stroke follow-up EXAM: CT HEAD WITHOUT CONTRAST TECHNIQUE: Contiguous axial images were obtained from the base of the skull through the vertex without intravenous contrast. RADIATION DOSE REDUCTION: This exam was performed according to the departmental dose-optimization program which includes automated exposure control, adjustment of the mA and/or kV according to patient size and/or use of iterative reconstruction technique. COMPARISON:  Head CT from 2 days ago FINDINGS: Brain: Slight decrease in subarachnoid hemorrhage along the lower left sylvian fissure. Interval MRA. Chronic small vessel ischemic change in the hemispheric white matter. No detected acute infarct. No hydrocephalus or masslike finding. Vascular: No hyperdense vessel or unexpected calcification. Skull: Normal. Negative for fracture or focal lesion. Sinuses/Orbits: Negative IMPRESSION: Slightly decreased subarachnoid hemorrhage along the left sylvian fissure. Electronically Signed   By: Jorje Guild M.D.   On: 12/12/2021 07:49  ? ?CT HEAD WO CONTRAST ? ?Result Date: 12/10/2021 ?CLINICAL DATA:  Fall and loss of consciousness yesterday afternoon. Noted left-sided facial droop. EXAM: CT HEAD WITHOUT CONTRAST CT CERVICAL SPINE WITHOUT CONTRAST TECHNIQUE: Multidetector CT imaging of the head and cervical spine was performed following the standard protocol without intravenous contrast. Multiplanar CT image reconstructions of the cervical spine were also generated. RADIATION DOSE REDUCTION: This exam was performed according to the departmental dose-optimization program which includes automated exposure control, adjustment of the mA and/or kV  according to patient size and/or use of iterative reconstruction technique. COMPARISON:  None. FINDINGS: CT HEAD FINDINGS Brain: There is acute subarachnoid hemorrhage along the left sylvian fissure and scattered foci of subarachnoid hemorrhage in the left frontal lobe. There is also linear hyperdense focus in the right medial temporal lobe (series 3, image 8-9). There is also small focus of hemorrhage in the right frontal lobe. There is no evidence of significant edema or midline shift. Vascular: No hyperdense vessel or unexpected calcification. Skull: Normal. Negative for fracture or focal lesion. Sinuses/Orbits: No acute finding. Other: None. CT CERVICAL SPINE FINDINGS Alignment: Reversal of normal cervical lordosis. Grade 1 anterolisthesis of C2. Stepwise retrolisthesis of C3, C4 and C5. Skull base and vertebrae: No acute fracture. No primary bone lesion or focal pathologic process. Soft tissues and spinal canal: No prevertebral fluid or swelling. No visible canal hematoma. Disc levels: Multilevel degenerate disc disease prominent at C3-C4, C4-C5 and C5-C6. C2-C3:  No significant finding. C3-C4: Disc osteophyte complex with mild spinal canal stenosis. No significant neural foraminal stenosis. C4-C5: Disc osteophyte complex with mild spinal canal stenosis. No significant neural foraminal stenosis. C5-C6: Disc osteophyte complex with narrowing of spinal canal. Mild bilateral neural foraminal stenosis, right greater than the left. C6-C7:  No significant spinal canal or neural foraminal stenosis. C7-T1:  No significant spinal canal or neural foraminal stenosis. Upper chest: Negative. Other: None IMPRESSION: 1. Acute bilateral subarachnoid hemorrhages predominantly along the left sylvian fissure, multiple  small foci in the left frontal lobe. Linear foci of subarachnoid hemorrhage in the right temporal lobe and a small focus in the right frontal lobe. No significant edema or midline shift. Follow-up examination in 6  hours for stability is recommended. 2. 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 No acute cervical spine fracture. Above findings were reported to Dr. Starleen Blue at approximately 4:35 p

## 2021-12-12 NOTE — Progress Notes (Signed)
PT Cancellation Note ? ?Patient Details ?Name: Dawn Sherman ?MRN: [id number] ?DOB: September 25, 1955 ? ? ?Cancelled Treatment:    Reason Eval/Treat Not Completed: Other (comment) ?Pt reports that she had just recently walked ~4 loops around the CCU without issue.  Family in room and confirms that she is moving well, deferred PT services this date.  Given this show of mobility today she may not need further PT f/u, will maintain on caseload and likely try to see to rule out defects or safety issues as appropriate.   ? ?Kreg Shropshire, DPT ?12/12/2021, 4:18 PM ?

## 2021-12-12 NOTE — Progress Notes (Signed)
? ?Progress Note ? ?Patient Name: Dawn Sherman ?Date of Encounter: 12/12/2021 ? ?[facility] Cardiologist: Ida Rogue, MD  ? ?Subjective  ? ?States doing okay, denies chest pain or shortness of breath.  Doing okay, denies dizziness. ? ?She states having 2 to 3 days of nausea, vomiting, diarrhea, dehydration leading to dizziness and subsequent fall causing presentation to ED. ? ?Inpatient Medications  ?  ?Scheduled Meds: ?  stroke: early stages of recovery book   Does not apply Once  ? sodium chloride   Intravenous Once  ? atorvastatin  40 mg Oral QPM  ? buPROPion  150 mg Oral Daily  ? Chlorhexidine Gluconate Cloth  6 each Topical Q0600  ? dexamethasone  40 mg Oral Daily  ? fluticasone furoate-vilanterol  1 puff Inhalation Daily  ? And  ? umeclidinium bromide  1 puff Inhalation Daily  ? hydrALAZINE  10 mg Oral TID  ? loratadine  10 mg Oral Daily  ? pantoprazole  40 mg Oral Daily  ? venlafaxine XR  150 mg Oral Q breakfast  ? ?Continuous Infusions: ? sodium chloride 85 mL/hr at 12/12/21 1000  ? Immune Globulin 10% (PRIVIGEN) 55 g    ? ?PRN Meds: ?acetaminophen **OR** acetaminophen, ipratropium-albuterol, ondansetron **OR** ondansetron (ZOFRAN) IV  ? ?Vital Signs  ?  ?Vitals:  ? 12/12/21 0800 12/12/21 0900 12/12/21 0912 12/12/21 1000  ?BP: (!) 161/73 139/60 139/60 (!) 148/75  ?Pulse: 84 (!) 144  (!) 50  ?Resp: 20 14  17   ?Temp:      ?TempSrc:      ?SpO2: 100% 100%  100%  ?Weight:      ?Height:      ? ? ?Intake/Output Summary (Last 24 hours) at 12/12/2021 1111 ?Last data filed at 12/12/2021 1000 ?Gross per 24 hour  ?Intake 1966.75 ml  ?Output --  ?Net 1966.75 ml  ? ? ?  12/10/2021  ? 10:45 PM 12/10/2021  ?  4:03 PM 11/27/2021  ?  2:48 PM  ?Last 3 Weights  ?Weight (lbs) 119 lb 0.8 oz 123 lb 124 lb  ?Weight (kg) 54 kg 55.792 kg 56.246 kg  ?   ? ?Telemetry  ?  ?Mobitz 2 AV block- Personally Reviewed ? ?ECG  ?  ? - Personally Reviewed ? ?Physical Exam  ? ?GEN: No acute distress.   ?Neck: No JVD ?Cardiac: Irregular  rhythm ?Respiratory: Poor respiratory effort, ?GI: Soft, nontender, non-distended  ?MS: No edema; knee bruises noted on left ?Neuro:  Nonfocal  ?Psych: Normal affect  ? ?Labs  ?  ?High Sensitivity Troponin:   ?Recent Labs  ?Lab 12/10/21 ?1606 12/11/21 ?0134  ?TROPONINIHS 21* 21*  ?   ?Chemistry ?Recent Labs  ?Lab 12/10/21 ?1606 12/11/21 ?4098 12/12/21 ?1191  ?NA 130* 132* 128*  ?K 3.2* 3.2* 3.3*  ?CL 101 108 107  ?CO2 18* 16* 14*  ?GLUCOSE 146* 90 195*  ?BUN 60* 54* 53*  ?CREATININE 2.49* 2.36* 2.09*  ?CALCIUM 8.1* 7.3* 7.2*  ?MG 2.7* 2.4 2.1  ?PROT 6.9  --   --   ?ALBUMIN 4.1  --   --   ?AST 16  --   --   ?ALT 12  --   --   ?ALKPHOS 79  --   --   ?BILITOT 0.4  --   --   ?GFRNONAA 21* 22* 26*  ?ANIONGAP 11 8 7   ?  ?Lipids No results for input(s): CHOL, TRIG, HDL, LABVLDL, LDLCALC, CHOLHDL in the last 168 hours.  ?Hematology ?Recent  Labs  ?Lab 12/11/21 ?0904 12/11/21 ?1650 12/11/21 ?2332 12/12/21 ?0654  ?WBC 14.1*  --  12.4* 14.1*  ?RBC 4.25  --  4.24 3.70*  ?HGB 11.5*  --  11.4* 9.9*  ?HCT 34.1*  --  34.1* 29.5*  ?MCV 80.2  --  80.4 79.7*  ?MCH 27.1  --  26.9 26.8  ?MCHC 33.7  --  33.4 33.6  ?RDW 16.7*  --  16.6* 16.4*  ?PLT 28* 23* 29* 64*  ? ?Thyroid  ?Recent Labs  ?Lab 12/10/21 ?1606  ?TSH 3.611  ?FREET4 0.76  ?  ?BNP ?Recent Labs  ?Lab 12/10/21 ?1606  ?BNP 41.5  ?  ?DDimer No results for input(s): DDIMER in the last 168 hours.  ? ?Radiology  ?  ?CT ABDOMEN PELVIS WO CONTRAST ? ?Result Date: 12/10/2021 ?CLINICAL DATA:  Sepsis. EXAM: CT ABDOMEN AND PELVIS WITHOUT CONTRAST TECHNIQUE: Multidetector CT imaging of the abdomen and pelvis was performed following the standard protocol without IV contrast. RADIATION DOSE REDUCTION: This exam was performed according to the departmental dose-optimization program which includes automated exposure control, adjustment of the mA and/or kV according to patient size and/or use of iterative reconstruction technique. COMPARISON:  None. FINDINGS: Motion degraded scan, limiting  assessment. Lower chest: No significant pulmonary nodules or acute consolidative airspace disease. Hepatobiliary: Normal liver size. No liver mass. Poorly visualized region of the gallbladder fossa due to motion degradation, unable to assess if the gallbladder is collapsed or absent. No biliary ductal dilatation. Pancreas: Normal, with no mass or duct dilation. Spleen: Normal size. No mass. Adrenals/Urinary Tract: Normal adrenals. Severe asymmetric right renal atrophy. No hydronephrosis. No renal stones. No contour deforming renal masses. Normal bladder. Stomach/Bowel: Normal non-distended stomach. Normal caliber small bowel with no small bowel wall thickening. Normal appendix. Normal large bowel with no diverticulosis, large bowel wall thickening or pericolonic fat stranding. Vascular/Lymphatic: Atherosclerotic nonaneurysmal abdominal aorta. No pathologically enlarged lymph nodes in the abdomen or pelvis. Reproductive: Status post hysterectomy, with no abnormal findings at the vaginal cuff. No adnexal mass. Other: No pneumoperitoneum, ascites or focal fluid collection. Musculoskeletal: No aggressive appearing focal osseous lesions. Mild levocurvature of the lumbar spine. IMPRESSION: 1. Very limited motion degraded unenhanced scan. No acute abnormality. No evidence of bowel obstruction or acute bowel inflammation. Normal appendix. 2. Severe asymmetric right renal atrophy. 3. Aortic Atherosclerosis (ICD10-I70.0). Electronically Signed   By: Ilona Sorrel M.D.   On: 12/10/2021 17:51  ? ?DG Chest 2 View ? ?Result Date: 12/10/2021 ?CLINICAL DATA:  Weakness for 3 days, fell, tobacco abuse EXAM: CHEST - 2 VIEW COMPARISON:  08/19/2021 FINDINGS: Frontal and lateral views of the chest demonstrate a stable cardiac silhouette. No acute airspace disease, effusion, or pneumothorax. Prior healed left rib fractures. Stable right convex thoracolumbar scoliosis. IMPRESSION: 1. No acute intrathoracic process. Electronically Signed    By: Randa Ngo M.D.   On: 12/10/2021 17:39  ? ?CT HEAD WO CONTRAST (5MM) ? ?Result Date: 12/12/2021 ?CLINICAL DATA:  Stroke follow-up EXAM: CT HEAD WITHOUT CONTRAST TECHNIQUE: Contiguous axial images were obtained from the base of the skull through the vertex without intravenous contrast. RADIATION DOSE REDUCTION: This exam was performed according to the departmental dose-optimization program which includes automated exposure control, adjustment of the mA and/or kV according to patient size and/or use of iterative reconstruction technique. COMPARISON:  Head CT from 2 days ago FINDINGS: Brain: Slight decrease in subarachnoid hemorrhage along the lower left sylvian fissure. Interval MRA. Chronic small vessel ischemic change in the hemispheric white  matter. No detected acute infarct. No hydrocephalus or masslike finding. Vascular: No hyperdense vessel or unexpected calcification. Skull: Normal. Negative for fracture or focal lesion. Sinuses/Orbits: Negative IMPRESSION: Slightly decreased subarachnoid hemorrhage along the left sylvian fissure. Electronically Signed   By: Jorje Guild M.D.   On: 12/12/2021 07:49  ? ?CT HEAD WO CONTRAST ? ?Result Date: 12/10/2021 ?CLINICAL DATA:  Fall and loss of consciousness yesterday afternoon. Noted left-sided facial droop. EXAM: CT HEAD WITHOUT CONTRAST CT CERVICAL SPINE WITHOUT CONTRAST TECHNIQUE: Multidetector CT imaging of the head and cervical spine was performed following the standard protocol without intravenous contrast. Multiplanar CT image reconstructions of the cervical spine were also generated. RADIATION DOSE REDUCTION: This exam was performed according to the departmental dose-optimization program which includes automated exposure control, adjustment of the mA and/or kV according to patient size and/or use of iterative reconstruction technique. COMPARISON:  None. FINDINGS: CT HEAD FINDINGS Brain: There is acute subarachnoid hemorrhage along the left sylvian fissure  and scattered foci of subarachnoid hemorrhage in the left frontal lobe. There is also linear hyperdense focus in the right medial temporal lobe (series 3, image 8-9). There is also small focus of hemorrhage in the ri

## 2021-12-12 NOTE — Progress Notes (Signed)
PHARMACY CONSULT NOTE ? ?Pharmacy Consult for Electrolyte Monitoring and Replacement  ? ?Recent Labs: ?Potassium (mmol/L)  ?Date Value  ?12/12/2021 3.3 (L)  ? ?Magnesium (mg/dL)  ?Date Value  ?12/12/2021 2.1  ? ?Calcium (mg/dL)  ?Date Value  ?12/12/2021 7.2 (L)  ? ?Albumin (g/dL)  ?Date Value  ?12/10/2021 4.1  ? ?Phosphorus (mg/dL)  ?Date Value  ?12/11/2021 3.1  ? ?Sodium (mmol/L)  ?Date Value  ?12/12/2021 128 (L)  ?09/05/2020 134  ? ? ? ?Assessment: 66 y.o. female w/ PMH of on Eliquis for history of A-fib also with history of  tobacco use, prior alcohol use, CKD, HTN, CHF, paroxysmal AFib presents to the ER after a fall and SAH. Pharmacy is asked to follow and replace electrolytes ? ?Goal of Therapy:  ?Electrolytes WNL ? ?Plan:  ?Oral KCl 40 mEq x 1 ?Recheck electrolytes in am ? ?Dallie Piles ,PharmD ?Clinical Pharmacist ?12/12/2021 7:12 AM ? ?

## 2021-12-13 DIAGNOSIS — E876 Hypokalemia: Secondary | ICD-10-CM

## 2021-12-13 DIAGNOSIS — I9589 Other hypotension: Secondary | ICD-10-CM

## 2021-12-13 DIAGNOSIS — E861 Hypovolemia: Secondary | ICD-10-CM

## 2021-12-13 DIAGNOSIS — J432 Centrilobular emphysema: Secondary | ICD-10-CM

## 2021-12-13 DIAGNOSIS — E871 Hypo-osmolality and hyponatremia: Secondary | ICD-10-CM

## 2021-12-13 DIAGNOSIS — N170 Acute kidney failure with tubular necrosis: Secondary | ICD-10-CM

## 2021-12-13 DIAGNOSIS — R55 Syncope and collapse: Secondary | ICD-10-CM | POA: Diagnosis not present

## 2021-12-13 DIAGNOSIS — W19XXXS Unspecified fall, sequela: Secondary | ICD-10-CM

## 2021-12-13 LAB — PREPARE PLATELET PHERESIS: Unit division: 0

## 2021-12-13 LAB — BPAM PLATELET PHERESIS
Blood Product Expiration Date: 202304212359
ISSUE DATE / TIME: 202304190428
Unit Type and Rh: 5100

## 2021-12-13 LAB — BASIC METABOLIC PANEL
Anion gap: 6 (ref 5–15)
BUN: 50 mg/dL — ABNORMAL HIGH (ref 8–23)
CO2: 15 mmol/L — ABNORMAL LOW (ref 22–32)
Calcium: 7 mg/dL — ABNORMAL LOW (ref 8.9–10.3)
Chloride: 112 mmol/L — ABNORMAL HIGH (ref 98–111)
Creatinine, Ser: 1.81 mg/dL — ABNORMAL HIGH (ref 0.44–1.00)
GFR, Estimated: 30 mL/min — ABNORMAL LOW (ref >60)
Glucose, Bld: 103 mg/dL — ABNORMAL HIGH (ref 70–99)
Potassium: 3.2 mmol/L — ABNORMAL LOW (ref 3.5–5.1)
Sodium: 133 mmol/L — ABNORMAL LOW (ref 135–145)

## 2021-12-13 LAB — CBC
HCT: 27.6 % — ABNORMAL LOW (ref 36.0–46.0)
Hemoglobin: 9.4 g/dL — ABNORMAL LOW (ref 12.0–15.0)
MCH: 27.2 pg (ref 26.0–34.0)
MCHC: 34.1 g/dL (ref 30.0–36.0)
MCV: 79.8 fL — ABNORMAL LOW (ref 80.0–100.0)
Platelets: 47 10*3/uL — ABNORMAL LOW (ref 150–400)
RBC: 3.46 MIL/uL — ABNORMAL LOW (ref 3.87–5.11)
RDW: 17 % — ABNORMAL HIGH (ref 11.5–15.5)
WBC: 16.8 10*3/uL — ABNORMAL HIGH (ref 4.0–10.5)
nRBC: 0 % (ref 0.0–0.2)

## 2021-12-13 LAB — PHOSPHORUS: Phosphorus: 2 mg/dL — ABNORMAL LOW (ref 2.5–4.6)

## 2021-12-13 LAB — ADAMTS13 ACTIVITY REFLEX

## 2021-12-13 LAB — MAGNESIUM: Magnesium: 1.8 mg/dL (ref 1.7–2.4)

## 2021-12-13 LAB — ADAMTS13 ACTIVITY: Adamts 13 Activity: 78.8 % (ref >66.8)

## 2021-12-13 MED ORDER — MAGNESIUM SULFATE 2 GM/50ML IV SOLN
2.0000 g | Freq: Once | INTRAVENOUS | Status: AC
  Administered 2021-12-13: 2 g via INTRAVENOUS
  Filled 2021-12-13: qty 50

## 2021-12-13 MED ORDER — SODIUM BICARBONATE 650 MG PO TABS
650.0000 mg | ORAL_TABLET | Freq: Three times a day (TID) | ORAL | Status: DC
  Administered 2021-12-13 – 2021-12-14 (×3): 650 mg via ORAL
  Filled 2021-12-13 (×4): qty 1

## 2021-12-13 MED ORDER — POTASSIUM PHOSPHATES 15 MMOLE/5ML IV SOLN
15.0000 mmol | Freq: Once | INTRAVENOUS | Status: AC
  Administered 2021-12-13: 15 mmol via INTRAVENOUS
  Filled 2021-12-13: qty 5

## 2021-12-13 MED ORDER — CALCIUM GLUCONATE-NACL 1-0.675 GM/50ML-% IV SOLN
1.0000 g | Freq: Once | INTRAVENOUS | Status: AC
Start: 2021-12-13 — End: 2021-12-13
  Administered 2021-12-13: 1000 mg via INTRAVENOUS
  Filled 2021-12-13: qty 50

## 2021-12-13 MED ORDER — FERROUS SULFATE 325 (65 FE) MG PO TABS
325.0000 mg | ORAL_TABLET | Freq: Two times a day (BID) | ORAL | Status: DC
Start: 2021-12-13 — End: 2021-12-14
  Administered 2021-12-13 – 2021-12-14 (×3): 325 mg via ORAL
  Filled 2021-12-13 (×3): qty 1

## 2021-12-13 MED ORDER — POTASSIUM CHLORIDE CRYS ER 20 MEQ PO TBCR
40.0000 meq | EXTENDED_RELEASE_TABLET | Freq: Once | ORAL | Status: AC
  Administered 2021-12-13: 40 meq via ORAL
  Filled 2021-12-13: qty 2

## 2021-12-13 NOTE — Progress Notes (Signed)
[place] at [facility] ? ? ?PATIENT NAME: Dawn Sherman   ? ?MR#:  [id number] ? ?DATE OF BIRTH:  1956-07-19 ? ?SUBJECTIVE:  ? ?no family the room. Overall doing well. No new issues ?Out in the chair ?VITALS:  ?Blood pressure (!) 150/94, pulse 77, temperature 98 ?F (36.7 ?C), temperature source Oral, resp. rate 18, height 5\' 5"  (1.651 m), weight 54 kg, SpO2 100 %. ? ?PHYSICAL EXAMINATION:  ? ?GENERAL:  66 y.o.-year-old patient lying in the bed with no acute distress. Facial bruise ?LUNGS: Normal breath sounds bilaterally, no wheezing, rales, rhonchi.  ?CARDIOVASCULAR: S1, S2 normal. No murmurs, rubs, or gallops.  ?ABDOMEN: Soft, nontender, nondistended. Bowel sounds present.  ?EXTREMITIES: No  edema b/l.    ?NEUROLOGIC: nonfocal  patient is alert and awake ?SKIN: bruise over the left knee ? ?LABORATORY PANEL:  ?CBC ?Recent Labs  ?Lab 12/13/21 ?0631  ?WBC 16.8*  ?HGB 9.4*  ?HCT 27.6*  ?PLT 47*  ? ? ? ?Chemistries  ?Recent Labs  ?Lab 12/10/21 ?1606 12/11/21 ?0904 12/13/21 ?0631  ?NA 130*   < > 133*  ?K 3.2*   < > 3.2*  ?CL 101   < > 112*  ?CO2 18*   < > 15*  ?GLUCOSE 146*   < > 103*  ?BUN 60*   < > 50*  ?CREATININE 2.49*   < > 1.81*  ?CALCIUM 8.1*   < > 7.0*  ?MG 2.7*   < > 1.8  ?AST 16  --   --   ?ALT 12  --   --   ?ALKPHOS 79  --   --   ?BILITOT 0.4  --   --   ? < > = values in this interval not displayed.  ? ? ?Cardiac Enzymes ?No results for input(s): TROPONINI in the last 168 hours. ?RADIOLOGY:  ?CT HEAD WO CONTRAST (5MM) ? ?Result Date: 12/12/2021 ?CLINICAL DATA:  Stroke follow-up EXAM: CT HEAD WITHOUT CONTRAST TECHNIQUE: Contiguous axial images were obtained from the base of the skull through the vertex without intravenous contrast. RADIATION DOSE REDUCTION: This exam was performed according to the departmental dose-optimization program which includes automated exposure control, adjustment of the mA and/or kV according to patient size and/or use of iterative reconstruction technique.  COMPARISON:  Head CT from 2 days ago FINDINGS: Brain: Slight decrease in subarachnoid hemorrhage along the lower left sylvian fissure. Interval MRA. Chronic small vessel ischemic change in the hemispheric white matter. No detected acute infarct. No hydrocephalus or masslike finding. Vascular: No hyperdense vessel or unexpected calcification. Skull: Normal. Negative for fracture or focal lesion. Sinuses/Orbits: Negative IMPRESSION: Slightly decreased subarachnoid hemorrhage along the left sylvian fissure. Electronically Signed   By: Jorje Guild M.D.   On: 12/12/2021 07:49   ? ?Assessment and Plan ?Ms. Stakes is a 66 year old woman with history of chronic HFrEF with recovered ejection fraction due to presumed nonischemic cardiomyopathy, paroxysmal atrial fibrillation, hypertension, hyperlipidemia, chronic kidney disease stage III, and tobacco abuse came to the emergency room after a fall in the setting of bradycardia and develop subarachnoid hemorrhage ? ?Fall/SAH ?chronic anticoagulation with eliquis ?-- seen by neurosurgery Dr. Cari Caraway-- mental status stable. No new deficit. Hold off further CT head imaging per neurosurgery. ?-- DC eliquis ?-- continue conservative management ?-- repeat CT head 4/19-- no acute changes ? 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 Janese Banks hematology /onc--input noted ?-- LDH normal. Reviewed peripheral Smear with pathologist Dr. Reuel Derby no schistocytes seen ?-- patient started on Decadron ?-completed IV IG x 2 doses ? ?Electrolyte abnormality ?--low K, Phosphorus, bicarb ?--pharmacy to replete ?--start po bicarb ? ?Bradycardia ?complete heart block/morbitz type II high-grade ?-- seen by [place] cardiology Dr End ?-- discontinue Coreg wait for washout ?-- if continues to remain bradycardia patient will be a candidate for  pacemaker.... Will need to wait till platelet counts are stable ?--HR much improved today in the  70's ? ?chronic a fib ?-- eliquis held secondary to thrombocytopenia and brain bleed ?-- hold Coreg for bradycardia ? ?history of cardiomyopathy ?history of HFrEF--now HFimproved EF ?-- patient had EF of 30 to 35% in the past ?-- echo shows EF of 55 to 60% ?--appears euvolemic ? ?AKI/CKD3b ?--baseline crea 1.5 ?--came in with creat 2.39-- will hydrate with IV fluids to avoid worsening creatinine with IV IG--2.0--1.8 ?-- nephrology consultation if needed ?-- hold PTA Lasix and Farxiaga ?-- avoid nephrotoxic agents ? ? ? ?Family communication : brother at bedside ?Consults : cardiology, hematology, pathology ?CODE STATUS: full ?DVT Prophylaxis : SCD ?Level of care: Med-Surg ?Status is: Inpatient ?Remains inpatient appropriate because: subarachnoid hemorrhage, acute thrombocytopenia ?  ?If remains stable will consider d/c in am ? ? ?TOTAL TIME TAKING CARE OF THIS PATIENT: 25 minutes.  ?>50% time spent on counselling and coordination of care ? ?Note: This dictation was prepared with Dragon dictation along with smaller phrase technology. Any transcriptional errors that result from this process are unintentional. ? ?Fritzi Mandes M.D  ? ? ?[facility]  ? ?CC: ?Primary care physician; [name], MD  ?

## 2021-12-13 NOTE — Plan of Care (Signed)
  Problem: Education: Goal: Knowledge of secondary prevention will improve (SELECT ALL) Outcome: Progressing Goal: Knowledge of patient specific risk factors will improve (INDIVIDUALIZE FOR PATIENT) Outcome: Progressing   

## 2021-12-13 NOTE — Progress Notes (Signed)
PHARMACY CONSULT NOTE ? ?Pharmacy Consult for Electrolyte Monitoring and Replacement  ? ?Recent Labs: ?Potassium (mmol/L)  ?Date Value  ?12/13/2021 3.2 (L)  ? ?Magnesium (mg/dL)  ?Date Value  ?12/13/2021 1.8  ? ?Calcium (mg/dL)  ?Date Value  ?12/13/2021 7.0 (L)  ? ?Albumin (g/dL)  ?Date Value  ?12/10/2021 4.1  ? ?Phosphorus (mg/dL)  ?Date Value  ?12/13/2021 2.0 (L)  ? ?Sodium (mmol/L)  ?Date Value  ?12/13/2021 133 (L)  ?09/05/2020 134  ? ? ? ?Assessment: 66 y.o. female w/ PMH of on Eliquis for history of A-fib also with history of  tobacco use, prior alcohol use, CKD, HTN, CHF, paroxysmal AFib presents to the ER after a fall and SAH. Pharmacy is asked to follow and replace electrolytes ? ?Goal of Therapy:  ?Potassium 4.0 - 5.1 mmol/L ?Magnesium 2.0 - 2.4 mg/dL ?All Other Electrolytes WNL ? ?Plan:  ?Oral KCl 40 mEq x 1 ?15 mmol IV potassium phosphate x 1 (contains 22 mEq IV potassium) ?2 grams IV magnesium sulfate x 1 ?1 gram IV calcium gluconate x 1 ?Recheck electrolytes in am ? ?Dallie Piles ,PharmD ?Clinical Pharmacist ?12/13/2021 8:57 AM ? ?

## 2021-12-13 NOTE — Progress Notes (Signed)
Occupational Therapy Treatment ?Patient Details ?Name: Dawn Sherman ?MRN: [id number] ?DOB: 1956-07-10 ?Today's Date: 12/13/2021 ? ? ?History of present illness Pt. is a 66 y.o. female with a hx of tobacco use, prior alcohol use, CKD, HTN, CHF, paroxysmal AFib with new onset of SAH . ?  ?OT comments ? Pt. performed supine to sit independently, sit to stand independently. Pt. required Supervision to manage the IV pole and line only. Pt. is independent LE dressing, independent standing at the sink for self-grooming. Independent with reaching down to pick up items from the floor while standing with no LOB, or reports of dizziness. Pt. education was provided about work simplification strategies for common IADL tasks at home. Pt. Plans to return home with her husband, and grandson to assist as needed. Pt. Continues to benefit from OT services for ADL training, and pt. education about work simplification strategies, home modification, and DME at this level of care.   ? ?Recommendations for follow up therapy are one component of a multi-disciplinary discharge planning process, led by the attending physician.  Recommendations may be updated based on patient status, additional functional criteria and insurance authorization. ?   ?Follow Up Recommendations ? No OT follow up  ?  ?Assistance Recommended at Discharge PRN  ?Patient can return home with the following ? Assistance with cooking/housework ?  ?Equipment Recommendations ? None recommended by OT  ?  ?Recommendations for Other Services   ? ?  ?Precautions / Restrictions Precautions ?Precautions: Fall ?Restrictions ?Weight Bearing Restrictions: No  ? ? ?  ? ?Mobility Bed Mobility ?Overal bed mobility: Independent ?  ?  ?  ?  ?  ?  ?  ?  ? ?Transfers ?Overall transfer level: Independent ?  ?Transfers: Sit to/from Stand ?  ?  ?  ?  ?  ?  ?  ?  ?  ?Balance   ?Sitting-balance support: Feet supported ?Sitting balance-Leahy Scale: Normal ?  ?  ?Standing balance support: During  functional activity ?Standing balance-Leahy Scale: Good ?  ?  ?  ?  ?  ?  ?  ?  ?  ?  ?  ?  ?   ? ?ADL either performed or assessed with clinical judgement  ? ?ADL Overall ADL's : Needs assistance/impaired ?Eating/Feeding: Set up ?  ?Grooming: Independent standing at the sinkside. ?  ?  ?  ?  ?  ?  ?  ?  ?  ?  ?  ?  ?  ?  ?  ?  ?General ADL Comments: Independent in the room without RW. Pt. required Supervision for the IV pole, and line. ?  ? ?Extremity/Trunk Assessment Upper Extremity Assessment ?Upper Extremity Assessment: Overall WFL for tasks assessed ?  ?  ?  ?  ?  ? ?Vision Patient Visual Report: No change from baseline ?  ?  ?Perception   ?  ?Praxis   ?  ? ?Cognition Arousal/Alertness: Awake/alert ?Behavior During Therapy: [facility] for tasks assessed/performed ?Overall Cognitive Status: Within Functional Limits for tasks assessed ?  ?  ?  ?  ?  ?  ?  ?  ?  ?  ?  ?  ?  ?  ?  ?  ?General Comments: Pleasant and cooperative ?  ?  ?   ?Exercises   ? ?  ?Shoulder Instructions   ? ? ?  ?General Comments    ? ? ?Pertinent Vitals/ Pain       Pain Assessment ?Pain Assessment: No/denies pain ? ?  Home Living   ?  ?  ?  ?  ?  ?  ?  ?  ?  ?  ?  ?  ?  ?  ?  ?  ?  ?  ? ?  ?Prior Functioning/Environment    ?  ?  ?  ?   ? ?Frequency ? Min 2X/week  ? ? ? ? ?  ?Progress Toward Goals ? ?OT Goals(current goals can now be found in the care plan section) ? Progress towards OT goals: Progressing toward goals ? ?Acute Rehab OT Goals ?Patient Stated Goal: To return home ?OT Goal Formulation: With patient ?Time For Goal Achievement: 12/25/21 ?Potential to Achieve Goals: Good  ?Plan Discharge plan remains appropriate;Frequency remains appropriate   ? ?Co-evaluation ? ? ?   ?  ?  ?  ?  ? ?  ?AM-PAC OT "6 Clicks" Daily Activity     ?Outcome Measure ? ? Help from another person eating meals?: None ?Help from another person taking care of personal grooming?: None ?Help from another person toileting, which includes using toliet, bedpan, or urinal?:  None ?Help from another person bathing (including washing, rinsing, drying)?: None ?Help from another person to put on and taking off regular upper body clothing?: None ?Help from another person to put on and taking off regular lower body clothing?: None ?6 Click Score: 24 ? ?  ?End of Session   ? ?OT Visit Diagnosis: Unsteadiness on feet (R26.81);Repeated falls (R29.6);Muscle weakness (generalized) (M62.81) ?  ?Activity Tolerance Patient tolerated treatment well ?  ?Patient Left in bed;with call bell/phone within reach;with bed alarm set ?  ?Nurse Communication Mobility status ?  ? ?   ? ?Time: 2505-3976 ?OT Time Calculation (min): 28 min ? ?Charges: OT General Charges ?$OT Visit: 1 Visit ?OT Treatments ?$Self Care/Home Management : 23-37 mins ? ?Harrel Carina, MS, OTR/L  ? ?Harrel Carina ?12/13/2021, 4:08 PM ?

## 2021-12-13 NOTE — Progress Notes (Signed)
? ? ?Progress Note ? ?Patient Name: Dawn Sherman ?Date of Encounter: 12/13/2021 ? ?Primary Cardiologist: Rockey Situ ? ?Subjective  ? ?Feels well this morning. No chest pain, dyspnea, palpitations, dizziness, presyncope, or syncope.  ? ?Inpatient Medications  ?  ?Scheduled Meds: ?  stroke: early stages of recovery book   Does not apply Once  ? sodium chloride   Intravenous Once  ? atorvastatin  40 mg Oral QPM  ? buPROPion  150 mg Oral Daily  ? Chlorhexidine Gluconate Cloth  6 each Topical Q0600  ? dexamethasone  40 mg Oral Daily  ? fluticasone furoate-vilanterol  1 puff Inhalation Daily  ? And  ? umeclidinium bromide  1 puff Inhalation Daily  ? hydrALAZINE  10 mg Oral TID  ? loratadine  10 mg Oral Daily  ? pantoprazole  40 mg Oral Daily  ? venlafaxine XR  150 mg Oral Q breakfast  ? ?Continuous Infusions: ? sodium chloride 50 mL/hr at 12/13/21 0500  ? ?PRN Meds: ?acetaminophen **OR** acetaminophen, ipratropium-albuterol, ondansetron **OR** ondansetron (ZOFRAN) IV  ? ?Vital Signs  ?  ?Vitals:  ? 12/13/21 0300 12/13/21 0400 12/13/21 0500 12/13/21 0600  ?BP: (!) 139/106 (!) 167/76 (!) 146/68 (!) 151/81  ?Pulse:  74 75 (!) 107  ?Resp: 20 18 17  (!) 28  ?Temp:  98 ?F (36.7 ?C)    ?TempSrc:  Oral    ?SpO2:  99% 96% 100%  ?Weight:      ?Height:      ? ? ?Intake/Output Summary (Last 24 hours) at 12/13/2021 0740 ?Last data filed at 12/13/2021 0500 ?Gross per 24 hour  ?Intake 1877.91 ml  ?Output 601 ml  ?Net 1276.91 ml  ? ?Filed Weights  ? 12/10/21 1603 12/10/21 2245  ?Weight: 55.8 kg 54 kg  ? ? ?Telemetry  ?  ?SR with 1st degree AV block with rare episodes of 2nd degree AV block type I - Personally Reviewed ? ?ECG  ?  ?No new tracings - Personally Reviewed ? ?Physical Exam  ? ?GEN: No acute distress.   ?Neck: No JVD. ?Cardiac: Irregular, no murmurs, rubs, or gallops.  ?Respiratory: Clear to auscultation bilaterally.  ?GI: Soft, nontender, non-distended.   ?MS: No edema; No deformity. ?Neuro:  Alert and oriented x 3; Nonfocal.   ?Psych: Normal affect. ? ?Labs  ?  ?Chemistry ?Recent Labs  ?Lab 12/10/21 ?1606 12/11/21 ?4163 12/12/21 ?8453  ?NA 130* 132* 128*  ?K 3.2* 3.2* 3.3*  ?CL 101 108 107  ?CO2 18* 16* 14*  ?GLUCOSE 146* 90 195*  ?BUN 60* 54* 53*  ?CREATININE 2.49* 2.36* 2.09*  ?CALCIUM 8.1* 7.3* 7.2*  ?PROT 6.9  --   --   ?ALBUMIN 4.1  --   --   ?AST 16  --   --   ?ALT 12  --   --   ?ALKPHOS 79  --   --   ?BILITOT 0.4  --   --   ?GFRNONAA 21* 22* 26*  ?ANIONGAP 11 8 7   ?  ? ?Hematology ?Recent Labs  ?Lab 12/11/21 ?2332 12/12/21 ?0654 12/13/21 ?0631  ?WBC 12.4* 14.1* 16.8*  ?RBC 4.24 3.70* 3.46*  ?HGB 11.4* 9.9* 9.4*  ?HCT 34.1* 29.5* 27.6*  ?MCV 80.4 79.7* 79.8*  ?MCH 26.9 26.8 27.2  ?MCHC 33.4 33.6 34.1  ?RDW 16.6* 16.4* 17.0*  ?PLT 29* 64* 47*  ? ? ?Cardiac EnzymesNo results for input(s): TROPONINI in the last 168 hours. No results for input(s): TROPIPOC in the last 168 hours.  ? ?BNP ?  Recent Labs  ?Lab 12/10/21 ?1606  ?BNP 41.5  ?  ? ?DDimer No results for input(s): DDIMER in the last 168 hours.  ? ?Radiology  ?  ?CT HEAD WO CONTRAST (5MM) ? ?Result Date: 12/12/2021 ?IMPRESSION: Slightly decreased subarachnoid hemorrhage along the left sylvian fissure. Electronically Signed   By: Jorje Guild M.D.   On: 12/12/2021 07:49    ? ?Cardiac Studies  ? ?2D echo 12/11/2021: ?1. Left ventricular ejection fraction, by estimation, is 65 to 70%. The  ?left ventricle has normal function. The left ventricle has no regional  ?wall motion abnormalities. There is mild left ventricular hypertrophy.  ?Left ventricular diastolic parameters  ?are indeterminate.  ? 2. Right ventricular systolic function is normal. The right ventricular  ?size is normal. There is normal pulmonary artery systolic pressure.  ? 3. Left atrial size was mildly dilated.  ? 4. The mitral valve is degenerative. Mild mitral valve regurgitation. No  ?evidence of mitral stenosis.  ? 5. The aortic valve has an indeterminant number of cusps. There is mild  ?thickening of the aortic  valve. Aortic valve regurgitation is not  ?visualized. Aortic valve sclerosis is present, with no evidence of aortic  ?valve stenosis.  ? 6. The inferior vena cava is normal in size with greater than 50%  ?respiratory variability, suggesting right atrial pressure of 3 mmHg. ? ?Patient Profile  ?   ?66 y.o. female with history of HFrEF with recovered LVEF due to presumed NICM, PAF, CKD stage III, HTN, HLD, and tobacco use who was admitted after a fall in the setting of bradycardia complicated by [facility] and we are seeing for high grade AV block. ? ?Assessment & Plan  ?  ?1. 1st degree AV block with intermittent 2nd degree AV block type I with history of intermittent complete heart block: ?-No further complete heart block ?-Coreg on hold, ?-Avoid AV nodal blocking medications ?-Magnesium and TSH normal ?-Replete potassium as below ? ?2. PAF: ?-Maintaining sinus rhythm ?-Eliquis on hold with SAH, resume when/if safe per neurosurgery  ?-Coreg on hold as above ? ?3. Anemia/thrombocytopenia: ?-Per hematology  ? ?4. Elevated troponin: ?-Mildly elevated and flat trending, not consistent with ACS ?-No heparin  ?-Not an invasive ischemic candidate with thrombocytopenia  ? ?5. Hypokalemia: ?-Recommend repletion to goal 4.0 ?-Likely contributing to her AV block ? ?6. Fall: ?-Appears to be in the context of orthostasis and dehydration based on history of labs ?-Avoid SGLT2i ?-Maintain adequate hydration  ? ? ?For questions or updates, please contact [place] ?Please consult [email or web address] for contact info under Cardiology/STEMI. ?  ? ?Signed, ?Christell Faith, PA-C ?[facility] ?Pager: [phone] ?12/13/2021, 7:40 AM ? ?

## 2021-12-13 NOTE — Progress Notes (Signed)
Physical Therapy Treatment ?Patient Details ?Name: Dawn Sherman ?MRN: [id number] ?DOB: [birth year] ?Today's Date: 12/13/2021 ? ? ?History of Present Illness 66 y.o. female with a hx of tobacco use, prior alcohol use, CKD, HTN, CHF, paroxysmal AFib with new onset of SAH . ? ?  ?PT Comments  ? ? Patient has made excellent progress with functional independence and has met goals adequately for discharge from PT services. Patient ambulated 3 laps around the nursing station in ICU without physical assistance using rolling walker. Patient demonstrated good safety awareness with functional mobility. No further acute PT needs at this time- discussed with Dr Posey Pronto. Please re-consult if there is a decline in functional independence.  ?  ?Recommendations for follow up therapy are one component of a multi-disciplinary discharge planning process, led by the attending physician.  Recommendations may be updated based on patient status, additional functional criteria and insurance authorization. ? ?Follow Up Recommendations ? No PT follow up ?  ?  ?Assistance Recommended at Discharge PRN  ?Patient can return home with the following   ?  ?Equipment Recommendations ? None recommended by PT  ?  ?Recommendations for Other Services   ? ? ?  ?Precautions / Restrictions Precautions ?Precautions: Fall ?Restrictions ?Weight Bearing Restrictions: No  ?  ? ?Mobility ? Bed Mobility ?  ?  ?  ?  ?  ?  ?  ?General bed mobility comments: not addressed as patient sitting up on arrival to room and post session ?  ? ?Transfers ?Overall transfer level: Modified independent ?Equipment used: Rolling walker (2 wheels) ?Transfers: Sit to/from Stand ?  ?  ?  ?  ?  ?  ?General transfer comment: no loss of balance with transfers. good safety awareness demonstrated ?  ? ?Ambulation/Gait ?Ambulation/Gait assistance: Modified independent (Device/Increase time) ?Gait Distance (Feet): 500 Feet ?Assistive device: Rolling walker (2 wheels) ?Gait Pattern/deviations:  Step-through pattern ?Gait velocity: normal ?  ?  ?General Gait Details: patient ambulated 3 laps around the unit without difficulty using rolling walker. reviewed proper placement of rolling walker and patient demonstrated carry over. no significant change in vitals noted with activity. educated patient on a walking schedule to use in home for conditioning. ? ? ?Stairs ?  ?  ?  ?  ?  ? ? ?Wheelchair Mobility ?  ? ?Modified Rankin (Stroke Patients Only) ?  ? ? ?  ?Balance   ?  ?  ?  ?  ?  ?  ?  ?  ?  ?  ?  ?  ?  ?  ?  ?  ?  ?  ?  ? ?  ?Cognition Arousal/Alertness: Awake/alert ?Behavior During Therapy: [facility] for tasks assessed/performed ?Overall Cognitive Status: Within Functional Limits for tasks assessed ?  ?  ?  ?  ?  ?  ?  ?  ?  ?  ?  ?  ?  ?  ?  ?  ?  ?  ?  ? ?  ?Exercises   ? ?  ?General Comments General comments (skin integrity, edema, etc.): discussed with Dr Posey Pronto. PT will sign off at this time given high level of functional independence. recommend routine walking with nursing staff supervision ?  ?  ? ?Pertinent Vitals/Pain Pain Assessment ?Pain Assessment: No/denies pain  ? ? ?Home Living   ?  ?  ?  ?  ?  ?  ?  ?  ?  ?   ?  ?Prior Function    ?  ?  ?   ? ?  PT Goals (current goals can now be found in the care plan section) Acute Rehab PT Goals ?Patient Stated Goal: to get better and go home. ?PT Goal Formulation: With patient ?Potential to Achieve Goals: Good ?Progress towards PT goals: Goals met/education completed, patient discharged from PT ? ?  ?Frequency ? ? ?   ? ? ? ?  ?PT Plan  (PT will sign off at this time)  ? ? ?Co-evaluation   ?  ?  ?  ?  ? ?  ?AM-PAC PT "6 Clicks" Mobility   ?Outcome Measure ? Help needed turning from your back to your side while in a flat bed without using bedrails?: None ?Help needed moving from lying on your back to sitting on the side of a flat bed without using bedrails?: None ?Help needed moving to and from a bed to a chair (including a wheelchair)?: None ?Help needed  standing up from a chair using your arms (e.g., wheelchair or bedside chair)?: None ?Help needed to walk in hospital room?: None ?Help needed climbing 3-5 steps with a railing? : None ?6 Click Score: 24 ? ?  ?End of Session Equipment Utilized During Treatment: Gait belt ?Activity Tolerance: Patient tolerated treatment well ?Patient left: in chair ?  ?PT Visit Diagnosis: Unsteadiness on feet (R26.81);Other abnormalities of gait and mobility (R26.89);History of falling (Z91.81);Difficulty in walking, not elsewhere classified (R26.2) ?  ? ? ?Time: 7185-5015 ?PT Time Calculation (min) (ACUTE ONLY): 25 min ? ?Charges:  $Gait Training: 23-37 mins          ?          ?Minna Merritts, PT, MPT ? ? ? ?Percell Locus ?12/13/2021, 12:58 PM ? ?

## 2021-12-14 ENCOUNTER — Telehealth: Payer: Self-pay

## 2021-12-14 ENCOUNTER — Other Ambulatory Visit: Payer: Self-pay | Admitting: *Deleted

## 2021-12-14 DIAGNOSIS — R55 Syncope and collapse: Secondary | ICD-10-CM | POA: Diagnosis not present

## 2021-12-14 LAB — RENAL FUNCTION PANEL
Albumin: 3.2 g/dL — ABNORMAL LOW (ref 3.5–5.0)
Anion gap: 6 (ref 5–15)
BUN: 55 mg/dL — ABNORMAL HIGH (ref 8–23)
CO2: 17 mmol/L — ABNORMAL LOW (ref 22–32)
Calcium: 7.5 mg/dL — ABNORMAL LOW (ref 8.9–10.3)
Chloride: 109 mmol/L (ref 98–111)
Creatinine, Ser: 1.87 mg/dL — ABNORMAL HIGH (ref 0.44–1.00)
GFR, Estimated: 29 mL/min — ABNORMAL LOW (ref >60)
Glucose, Bld: 101 mg/dL — ABNORMAL HIGH (ref 70–99)
Phosphorus: 2.2 mg/dL — ABNORMAL LOW (ref 2.5–4.6)
Potassium: 3.7 mmol/L (ref 3.5–5.1)
Sodium: 132 mmol/L — ABNORMAL LOW (ref 135–145)

## 2021-12-14 LAB — MAGNESIUM: Magnesium: 2.2 mg/dL (ref 1.7–2.4)

## 2021-12-14 LAB — CBC
HCT: 30.7 % — ABNORMAL LOW (ref 36.0–46.0)
Hemoglobin: 10.3 g/dL — ABNORMAL LOW (ref 12.0–15.0)
MCH: 27.1 pg (ref 26.0–34.0)
MCHC: 33.6 g/dL (ref 30.0–36.0)
MCV: 80.8 fL (ref 80.0–100.0)
Platelets: 64 10*3/uL — ABNORMAL LOW (ref 150–400)
RBC: 3.8 MIL/uL — ABNORMAL LOW (ref 3.87–5.11)
RDW: 17.6 % — ABNORMAL HIGH (ref 11.5–15.5)
WBC: 17.8 10*3/uL — ABNORMAL HIGH (ref 4.0–10.5)
nRBC: 0 % (ref 0.0–0.2)

## 2021-12-14 MED ORDER — CALCIUM CARBONATE 1250 (500 CA) MG PO TABS: 1.0000 | ORAL_TABLET | Freq: Three times a day (TID) | ORAL | 1 refills | Status: AC

## 2021-12-14 MED ORDER — AMLODIPINE BESYLATE 10 MG PO TABS: 10.0000 mg | ORAL_TABLET | Freq: Every day | ORAL | 1 refills | Status: DC

## 2021-12-14 MED ORDER — POTASSIUM & SODIUM PHOSPHATES 280-160-250 MG PO PACK
2.0000 | PACK | Freq: Three times a day (TID) | ORAL | Status: DC
  Administered 2021-12-14: 2 via ORAL
  Filled 2021-12-14 (×2): qty 2

## 2021-12-14 MED ORDER — CALCIUM GLUCONATE-NACL 1-0.675 GM/50ML-% IV SOLN
1.0000 g | Freq: Once | INTRAVENOUS | Status: DC
  Filled 2021-12-14: qty 50

## 2021-12-14 MED ORDER — SODIUM BICARBONATE 650 MG PO TABS: 650.0000 mg | ORAL_TABLET | Freq: Two times a day (BID) | ORAL | 0 refills | Status: DC

## 2021-12-14 MED ORDER — CALCIUM CARBONATE 1250 (500 CA) MG PO TABS
1.0000 | ORAL_TABLET | Freq: Three times a day (TID) | ORAL | Status: DC
  Administered 2021-12-14: 500 mg via ORAL
  Filled 2021-12-14 (×2): qty 1

## 2021-12-14 MED ORDER — POTASSIUM & SODIUM PHOSPHATES 280-160-250 MG PO PACK: 2.0000 | PACK | Freq: Three times a day (TID) | ORAL | 0 refills | Status: DC

## 2021-12-14 MED ORDER — FERROUS SULFATE 325 (65 FE) MG PO TABS: 325.0000 mg | ORAL_TABLET | Freq: Two times a day (BID) | ORAL | 3 refills | Status: DC

## 2021-12-14 NOTE — Discharge Summary (Signed)
?Physician Discharge Summary ?  ?Patient: Dawn Sherman MRN: [id number] DOB: 12-Dec-1955  ?Admit date:     12/10/2021  ?Discharge date: 12/14/21  ?Discharge Physician: Fritzi Mandes  ? ?PCP: [name], MD  ? ?Recommendations at discharge:  ? ? HOLD YOUR ELIQUIS TILL FURTHER INSTRUCTIONS form your PCP or Neurosurgery ?NO/Avoid ASA, ibuprofen or any OTC NSAIDS ? ? ?Discharge Diagnoses: ?Subarachnoid hemorrhage status post fall in the setting of chronic anticoagulation ?Acute idiopathic thrombocytopenia--New ?Electrolyte abnormality ?Bradycardia (High grade Mobitz type II) ? ?Hospital Course: ? ?Ms. Shockley is a 66 year old woman with history of chronic HFrEF with recovered ejection fraction due to presumed nonischemic cardiomyopathy, paroxysmal atrial fibrillation, hypertension, hyperlipidemia, chronic kidney disease stage III, and tobacco abuse came to the emergency room after a fall in the setting of bradycardia and develop subarachnoid hemorrhage ?  ?Fall/SAH ?chronic anticoagulation with eliquis ?-- seen by neurosurgery Dr. Cari Caraway-- mental status stable. No new deficit. Hold off further CT head imaging per neurosurgery. ?-- DC eliquis ?-- continue conservative management ?-- repeat CT head 4/19-- no acute changes ?  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 Janese Banks hematology /onc--input noted ?-- LDH normal. Reviewed peripheral Smear with pathologist Dr. Reuel Derby no schistocytes seen ?-- patient completed Decadron 4 doses ?--completed IV IG x 2 doses ?-- patient will follow-up Dr. Janese Banks as outpatient ?  ?Electrolyte abnormality ?--low K, Phosphorus, bicarb ?--pharmacy to replete ?--start po bicarb, K phosphorus and calcium repletion as out pt ?  ?Bradycardia ?Complete heart block/morbitz type II high-grade ?-- seen by [facility] cardiology Dr End ?--  discontinue Coreg wait for washout ?-- if continues to remain bradycardia patient will be a candidate for pacemaker.... Will need to wait till platelet counts are stable ?--HR much improved today in the  70's ?--pt will f/u with Dr Rockey Situ ?  ?chronic a fib ?-- eliquis held secondary to thrombocytopenia and brain bleed ?-- hold Coreg for bradycardia ?  ?history of cardiomyopathy ?history of HFrEF--now HFimproved EF ?HTN ?-- patient had EF of 30 to 35% in the past ?-- echo shows EF of 55 to 60% ?--appears euvolemic ?--holding Lasix. Resumed Hydralazine, amlodipine ?  ?AKI/CKD3b ?--baseline crea 1.5 ?--came in with creat 2.39-- will hydrate with IV fluids to avoid worsening creatinine with IV IG--2.0--1.8 ?-- nephrology consultation if needed ?-- hold PTA Lasix and Farxiaga ?-- avoid nephrotoxic agents ?--now on po bicarb pills. F/u Nephrology as out pt--Dr lateef ?  ?patient overall doing well will discharged to home. Okay from hematology standpoint for discharge ?  ?Family communication : none today ?Consults : cardiology, hematology, pathology, neurosurgery ?CODE STATUS: full ?DVT Prophylaxis : SCD ?Level of care: Med-Surg ? ?  ? ? ?Disposition: Home health ?Diet recommendation:  ?Discharge Diet Orders (From admission, onward)  ? ?  Start     Ordered  ? 12/14/21 0000  Diet - low sodium heart healthy       ? 12/14/21 0825  ? ?  ?  ? ?  ? ?Cardiac and Carb modified diet ?DISCHARGE MEDICATION: ?Allergies as of 12/14/2021   ? ?   Reactions  ? Sulfate Rash  ? Codeine Sulfate Nausea Only  ? Benicar [olmesartan]   ? Talked with patient February 10, 2020, intolerance is unclear, tried several medications around that time and one of them gave her a rash but she is not clear  which 1.  ? Amoxicillin Rash  ? Clindamycin/lincomycin Rash  ? Morphine And Related Rash  ? Penicillins Rash  ? ?  ? ?  ?Medication List  ?  ? ?STOP taking these medications   ? ?carvedilol 25 MG tablet ?Commonly known as: COREG ?  ?Eliquis 2.5 MG Tabs  tablet ?Generic drug: apixaban ?  ?furosemide 40 MG tablet ?Commonly known as: LASIX ?  ?ipratropium-albuterol 0.5-2.5 (3) MG/3ML Soln ?Commonly known as: DUONEB ?  ?patiromer 8.4 g packet ?Commonly known as: VELTASSA ?  ? ?  ? ?TAKE these medications   ? ?acetaminophen 325 MG tablet ?Commonly known as: TYLENOL ?Take 650 mg by mouth every 6 (six) hours as needed. ?  ?amLODipine 10 MG tablet ?Commonly known as: NORVASC ?Take 1 tablet (10 mg total) by mouth daily. ?What changed:  ?medication strength ?how much to take ?  ?atorvastatin 40 MG tablet ?Commonly known as: LIPITOR ?TAKE 1 TABLET BY MOUTH EVERY DAY ?  ?buPROPion 150 MG 24 hr tablet ?Commonly known as: WELLBUTRIN XL ?TAKE 1 TABLET BY MOUTH EVERY DAY ?  ?calcium carbonate 1250 (500 Ca) MG tablet ?Commonly known as: OS-CAL - dosed in mg of elemental calcium ?Take 1 tablet (500 mg of elemental calcium total) by mouth 3 (three) times daily with meals. ?  ?Farxiga 10 MG Tabs tablet ?Generic drug: dapagliflozin propanediol ?Take 10 mg by mouth daily. ?  ?ferrous sulfate 325 (65 FE) MG tablet ?Take 1 tablet (325 mg total) by mouth 2 (two) times daily with a meal. ?  ?hydrALAZINE 10 MG tablet ?Commonly known as: APRESOLINE ?Take 1 tablet (10 mg total) by mouth 3 (three) times daily. ?  ?loratadine 10 MG tablet ?Commonly known as: CLARITIN ?Take 10 mg by mouth daily. ?  ?omeprazole 20 MG capsule ?Commonly known as: PRILOSEC ?Take 20 mg by mouth daily. ?  ?potassium & sodium phosphates 280-160-250 MG Pack ?Commonly known as: PHOS-NAK ?Take 2 packets by mouth 4 (four) times daily -  with meals and at bedtime for 6 doses. ?  ?sodium bicarbonate 650 MG tablet ?Take 1 tablet (650 mg total) by mouth 2 (two) times daily. ?  ?Trelegy Ellipta 100-62.5-25 MCG/ACT Aepb ?Generic drug: Fluticasone-Umeclidin-Vilant ?Inhale 1 puff into the lungs daily. ?  ?venlafaxine XR 150 MG 24 hr capsule ?Commonly known as: EFFEXOR-XR ?TAKE 1 CAPSULE BY MOUTH EVERY DAY ?  ? ?  ? ? Follow-up  Information   ? ? [name], MD. Schedule an appointment as soon as possible for a visit in 1 week(s).   ?Specialty: Internal Medicine ?Why: hospital f/u ?Contact information: ?[address] ?[address] ?[place] [address] ?[phone] ? ? ?  ?  ? ? Minna Merritts, MD .   ?Specialty: Cardiology ?Contact information: ?[address] ?[place] [address] ?[phone] ? ? ?  ?  ? ? Meade Maw, MD. Schedule an appointment as soon as possible for a visit in 2 week(s).   ?Specialty: Neurosurgery ?Why: INtracranial bleed ?Contact information: ?[place] [address] ?[phone] ? ? ?  ?  ? ? Sindy Guadeloupe, MD. Schedule an appointment as soon as possible for a visit in 1 week(s).   ?Specialty: Oncology ?Why: for ITP ?Contact information: ?[place] [address] ?[phone] ? ? ?  ?  ? ?  ?  ? ?  ? ?Discharge Exam: ?Filed Weights  ? 12/10/21 1603 12/10/21 2245  ?Weight: 55.8 kg 54 kg  ? ? ? ?  Condition at discharge: fair ? ?The results of significant diagnostics from this hospitalization (including imaging, microbiology, ancillary and laboratory) are listed below for reference.  ? ?Imaging Studies: ?CT ABDOMEN PELVIS WO CONTRAST ? ?Result Date: 12/10/2021 ?CLINICAL DATA:  Sepsis. EXAM: CT ABDOMEN AND PELVIS WITHOUT CONTRAST TECHNIQUE: Multidetector CT imaging of the abdomen and pelvis was performed following the standard protocol without IV contrast. RADIATION DOSE REDUCTION: This exam was performed according to the departmental dose-optimization program which includes automated exposure control, adjustment of the mA and/or kV according to patient size and/or use of iterative reconstruction technique. COMPARISON:  None. FINDINGS: Motion degraded scan, limiting assessment. Lower chest: No significant pulmonary nodules or acute consolidative airspace disease. Hepatobiliary: Normal liver size. No liver mass. Poorly visualized region of the  gallbladder fossa due to motion degradation, unable to assess if the gallbladder is collapsed or absent. No biliary ductal dilatation. Pancreas: Normal, with no mass or duct dilation. Spleen: Normal size. No mass.

## 2021-12-14 NOTE — Progress Notes (Unsigned)
Put in orders for lab ?

## 2021-12-14 NOTE — Telephone Encounter (Signed)
Transition Care Management  ?Patient discharged today 12/14/21. Hospital follow up scheduled 12/17/21 @ 3:00. Patient seen within 2 days of discharge qualifies for TCM. No answer when called today, and no opportunity to call again prior to scheduled appointment. Okay to code as TCM. ?

## 2021-12-14 NOTE — Plan of Care (Signed)
?  Problem: Education: ?Goal: Knowledge of disease or condition will improve ?Outcome: Adequate for Discharge ?Goal: Understanding of medication regimen will improve ?Outcome: Adequate for Discharge ?Goal: Individualized Educational Video(s) ?Outcome: Adequate for Discharge ?  ?Problem: Activity: ?Goal: Ability to tolerate increased activity will improve ?Outcome: Adequate for Discharge ?  ?Problem: Cardiac: ?Goal: Ability to achieve and maintain adequate cardiopulmonary perfusion will improve ?Outcome: Adequate for Discharge ?  ?Problem: Health Behavior/Discharge Planning: ?Goal: Ability to safely manage health-related needs after discharge will improve ?Outcome: Adequate for Discharge ?  ?Problem: Education: ?Goal: Knowledge of General Education information will improve ?Description: Including pain rating scale, medication(s)/side effects and non-pharmacologic comfort measures ?Outcome: Adequate for Discharge ?  ?Problem: Health Behavior/Discharge Planning: ?Goal: Ability to manage health-related needs will improve ?Outcome: Adequate for Discharge ?  ?

## 2021-12-14 NOTE — Discharge Instructions (Addendum)
HOLD YOUR ELIQUIS TILL FURTHER INSTRUCTIONS form your PCP or Neurosurgery ? ?NO/Avoid ASA, ibuprofen or any OTC NSAIDS ?

## 2021-12-14 NOTE — Progress Notes (Signed)
Cbc ordered per dr Janese Banks ?

## 2021-12-15 NOTE — Progress Notes (Deleted)
Patient ID: Dawn Sherman, female    DOB: 08/03/1956, 66 y.o.   MRN: [id number]   Dawn Sherman is a 66 y/o female with a history of HTN, CKD, hyperlipidemia, depression, current tobacco use and chronic heart failure.   Echo report from 12/11/21 reviewed and showed an EF of 65-70% along with mild LVH/ LAE and mild MR. Echo report from 08/20/21 reviewed and showed EF of 50 to 55%. The left ventricle has low normal function. The left ventricle has no regional wall motion abnormalities. The left ventricular internal cavity size was mildly dilated. Echo report from 02/10/20 reviewed and showed an EF of 30-35% along with mildly elevated PA pressure, moderate MR and mild/moderate TR.   Admitted 12/10/21 due to a fall in the setting of bradycardia and developed subarachnoid hemorrhage. Cardiology, neurosurgery & hematology consults obtained. Head CT obtained. NOC stopped. Platelet transfusions needed for new ITP. Given decadron and 2 doses of IV IG. Hypokalemia corrected. Coreg held due to bradycardia. Discharged after 4 days. Admitted 08/19/21 due to COPD exacerbation. She was discharged home after 7 days with oxygen, however has not needed to use it since discharge. Her SPO2 is normally 95%. At discharge, her lasix was resumed.   She presents today for a follow- up visit with a chief complaint of   Past Medical History:  Diagnosis Date   B12 deficiency    CHF (congestive heart failure) ([place])    CKD (chronic kidney disease), stage III ([place])    Depression    Endometriosis    Hypertension    Mixed hyperlipidemia    Tobacco abuse    Past Surgical History:  Procedure Laterality Date   ABDOMINAL HYSTERECTOMY     BREAST BIOPSY Right 2015   neg-  FIBROADENOMA   TAH/RSO  1999   secondary to bleeding and endometriosis (Dr Vernie Ammons)   Family History  Problem Relation Age of Onset   Hypercholesterolemia Mother    Rheum arthritis Father    Rheum arthritis Daughter    Fibromyalgia Daughter    Breast  cancer Neg Hx    Colon cancer Neg Hx    Social History   Tobacco Use   Smoking status: Every Day    Packs/day: 0.25    Years: 40.00    Pack years: 10.00    Types: Cigarettes   Smokeless tobacco: Never  Substance Use Topics   Alcohol use: Not Currently    Alcohol/week: 0.0 standard drinks    Comment: occasional   Allergies  Allergen Reactions   Sulfate Rash   Codeine Sulfate Nausea Only   Benicar [Olmesartan]     Talked with patient February 10, 2020, intolerance is unclear, tried several medications around that time and one of them gave her a rash but she is not clear which 1.   Amoxicillin Rash   Clindamycin/Lincomycin Rash   Morphine And Related Rash   Penicillins Rash     Review of Systems  Constitutional:  Negative for appetite change and fatigue.  HENT:  Negative for congestion, postnasal drip and sore throat.   Eyes: Negative.   Respiratory:  Positive for shortness of breath (baseline on moderate exertion, related to her COPD). Negative for cough and wheezing.   Cardiovascular:  Negative for chest pain, palpitations and leg swelling.  Gastrointestinal:  Negative for abdominal distention and abdominal pain.  Endocrine: Negative.   Genitourinary: Negative.   Musculoskeletal:  Negative for back pain and neck pain.  Skin: Negative.   Allergic/Immunologic: Negative.  Neurological:  Negative for dizziness and light-headedness.  Hematological:  Negative for adenopathy. Does not bruise/bleed easily.  Psychiatric/Behavioral:  Negative for dysphoric mood and sleep disturbance (sleeps on 1 pillow, has CPAP but does not wear). The patient is not nervous/anxious.       Physical Exam Vitals and nursing note reviewed.  Constitutional:      General: She is not in acute distress.    Appearance: Normal appearance. She is not ill-appearing.  HENT:     Head: Normocephalic and atraumatic.  Cardiovascular:     Rate and Rhythm: Normal rate and regular rhythm.     Heart sounds:  No murmur heard. Pulmonary:     Effort: Pulmonary effort is normal. No respiratory distress.     Breath sounds: No wheezing or rales.  Abdominal:     General: There is no distension.     Palpations: Abdomen is soft.     Tenderness: There is no abdominal tenderness.  Musculoskeletal:        General: Swelling and deformity (RLE, previous ankle surgery) present. No tenderness.     Cervical back: Normal range of motion and neck supple.     Right lower leg: No edema.     Left lower leg: Edema (+1) present.  Skin:    General: Skin is warm and dry.  Neurological:     General: No focal deficit present.     Mental Status: She is alert and oriented to person, place, and time.  Psychiatric:        Mood and Affect: Mood normal.        Behavior: Behavior normal.        Thought Content: Thought content normal.    Assessment & Plan:  1: Chronic heart failure with preserved ejection fraction with structural changes- - NYHA class II  - euvolemic today - weighing daily; reminded to call for an overnight weight gain of >2 pounds or a weekly weight gain of >5 pounds - weight 126.4 pounds from last visit here 3 months ago - not adding salt to her food - on coreg, losartan (entresto had caused hyperkalemia) - BNP 12/10/21 was 41.5  2: HTN- - BP  - saw PCP [name]) 11/23/21 - BMP 12/14/21 reviewed and showed sodium 132, potassium 3.7, creatinine 1.87 and GFR 29 - saw nephrology [name]) 11/20/21  3: Afib - SR today - saw cardiology Rockey Situ) 02/07/21  4: COPD - wearing oxygen at   5: Tobacco use- - smoking 4 cigarettes/day - trying to completely quit - complete cessation discussed for 3 minutes with her  6: Sleep Apnea - has CPAP but has not used for some time - does not provide clear explanation as why she no longer uses it - advises she sleeps "great"    Medication list reviewed.

## 2021-12-16 ENCOUNTER — Other Ambulatory Visit: Payer: Self-pay

## 2021-12-16 ENCOUNTER — Observation Stay
Admission: EM | Admit: 2021-12-16 | Discharge: 2021-12-17 | Disposition: A | Discharge status: Home / Self Care | Payer: Medicare Other | Attending: Internal Medicine | Admitting: Internal Medicine

## 2021-12-16 ENCOUNTER — Emergency Department: Payer: Medicare Other

## 2021-12-16 DIAGNOSIS — R531 Weakness: Secondary | ICD-10-CM | POA: Diagnosis present

## 2021-12-16 DIAGNOSIS — D696 Thrombocytopenia, unspecified: Secondary | ICD-10-CM | POA: Diagnosis not present

## 2021-12-16 DIAGNOSIS — F32A Depression, unspecified: Secondary | ICD-10-CM

## 2021-12-16 DIAGNOSIS — I130 Hypertensive heart and chronic kidney disease with heart failure and stage 1 through stage 4 chronic kidney disease, or unspecified chronic kidney disease: Secondary | ICD-10-CM | POA: Diagnosis not present

## 2021-12-16 DIAGNOSIS — N183 Chronic kidney disease, stage 3 unspecified: Secondary | ICD-10-CM | POA: Diagnosis not present

## 2021-12-16 DIAGNOSIS — N179 Acute kidney failure, unspecified: Principal | ICD-10-CM | POA: Diagnosis present

## 2021-12-16 DIAGNOSIS — Z79899 Other long term (current) drug therapy: Secondary | ICD-10-CM | POA: Insufficient documentation

## 2021-12-16 DIAGNOSIS — I482 Chronic atrial fibrillation, unspecified: Secondary | ICD-10-CM | POA: Diagnosis not present

## 2021-12-16 DIAGNOSIS — I5032 Chronic diastolic (congestive) heart failure: Secondary | ICD-10-CM | POA: Diagnosis not present

## 2021-12-16 DIAGNOSIS — G9341 Metabolic encephalopathy: Secondary | ICD-10-CM | POA: Diagnosis not present

## 2021-12-16 DIAGNOSIS — Z7984 Long term (current) use of oral hypoglycemic drugs: Secondary | ICD-10-CM | POA: Insufficient documentation

## 2021-12-16 DIAGNOSIS — F17210 Nicotine dependence, cigarettes, uncomplicated: Secondary | ICD-10-CM | POA: Diagnosis not present

## 2021-12-16 DIAGNOSIS — K219 Gastro-esophageal reflux disease without esophagitis: Secondary | ICD-10-CM

## 2021-12-16 DIAGNOSIS — Z8679 Personal history of other diseases of the circulatory system: Secondary | ICD-10-CM

## 2021-12-16 DIAGNOSIS — I10 Essential (primary) hypertension: Secondary | ICD-10-CM

## 2021-12-16 DIAGNOSIS — F325 Major depressive disorder, single episode, in full remission: Secondary | ICD-10-CM

## 2021-12-16 DIAGNOSIS — I609 Nontraumatic subarachnoid hemorrhage, unspecified: Secondary | ICD-10-CM

## 2021-12-16 DIAGNOSIS — E785 Hyperlipidemia, unspecified: Secondary | ICD-10-CM

## 2021-12-16 DIAGNOSIS — K529 Noninfective gastroenteritis and colitis, unspecified: Secondary | ICD-10-CM

## 2021-12-16 LAB — URINALYSIS, ROUTINE W REFLEX MICROSCOPIC
Bilirubin Urine: NEGATIVE
Glucose, UA: 50 mg/dL — AB
Hgb urine dipstick: NEGATIVE
Ketones, ur: NEGATIVE mg/dL
Leukocytes,Ua: NEGATIVE
Nitrite: NEGATIVE
Protein, ur: 30 mg/dL — AB
Specific Gravity, Urine: 1.005 (ref 1.005–1.030)
Squamous Epithelial / HPF: NONE SEEN (ref 0–5)
WBC, UA: NONE SEEN WBC/hpf (ref 0–5)
pH: 6 (ref 5.0–8.0)

## 2021-12-16 LAB — COMPREHENSIVE METABOLIC PANEL
ALT: 23 U/L (ref 0–44)
AST: 28 U/L (ref 15–41)
Albumin: 3.5 g/dL (ref 3.5–5.0)
Alkaline Phosphatase: 66 U/L (ref 38–126)
Anion gap: 9 (ref 5–15)
BUN: 50 mg/dL — ABNORMAL HIGH (ref 8–23)
CO2: 19 mmol/L — ABNORMAL LOW (ref 22–32)
Calcium: 8.2 mg/dL — ABNORMAL LOW (ref 8.9–10.3)
Chloride: 101 mmol/L (ref 98–111)
Creatinine, Ser: 2.21 mg/dL — ABNORMAL HIGH (ref 0.44–1.00)
GFR, Estimated: 24 mL/min — ABNORMAL LOW (ref >60)
Glucose, Bld: 98 mg/dL (ref 70–99)
Potassium: 3.7 mmol/L (ref 3.5–5.1)
Sodium: 129 mmol/L — ABNORMAL LOW (ref 135–145)
Total Bilirubin: 0.6 mg/dL (ref 0.3–1.2)
Total Protein: 7.7 g/dL (ref 6.5–8.1)

## 2021-12-16 LAB — CBC
HCT: 33.8 % — ABNORMAL LOW (ref 36.0–46.0)
Hemoglobin: 10.9 g/dL — ABNORMAL LOW (ref 12.0–15.0)
MCH: 26.9 pg (ref 26.0–34.0)
MCHC: 32.2 g/dL (ref 30.0–36.0)
MCV: 83.5 fL (ref 80.0–100.0)
Platelets: 181 10*3/uL (ref 150–400)
RBC: 4.05 MIL/uL (ref 3.87–5.11)
RDW: 18.5 % — ABNORMAL HIGH (ref 11.5–15.5)
WBC: 12.5 10*3/uL — ABNORMAL HIGH (ref 4.0–10.5)
nRBC: 0 % (ref 0.0–0.2)

## 2021-12-16 LAB — TYPE AND SCREEN
ABO/RH(D): O NEG
Antibody Screen: NEGATIVE

## 2021-12-16 LAB — MAGNESIUM: Magnesium: 1.9 mg/dL (ref 1.7–2.4)

## 2021-12-16 LAB — TROPONIN I (HIGH SENSITIVITY): Troponin I (High Sensitivity): 19 ng/L — ABNORMAL HIGH (ref <18)

## 2021-12-16 MED ORDER — POTASSIUM & SODIUM PHOSPHATES 280-160-250 MG PO PACK: 2.0000 | PACK | Freq: Three times a day (TID) | ORAL | Status: DC

## 2021-12-16 MED ORDER — AMLODIPINE BESYLATE 5 MG PO TABS
10.0000 mg | ORAL_TABLET | Freq: Every day | ORAL | Status: DC
  Administered 2021-12-17: 10 mg via ORAL
  Filled 2021-12-16 (×2): qty 2

## 2021-12-16 MED ORDER — ACETAMINOPHEN 325 MG PO TABS: 650.0000 mg | ORAL_TABLET | Freq: Four times a day (QID) | ORAL | Status: DC | PRN

## 2021-12-16 MED ORDER — PANTOPRAZOLE SODIUM 40 MG PO TBEC
40.0000 mg | DELAYED_RELEASE_TABLET | Freq: Every day | ORAL | Status: DC
  Administered 2021-12-17: 40 mg via ORAL
  Filled 2021-12-16 (×2): qty 1

## 2021-12-16 MED ORDER — FERROUS SULFATE 325 (65 FE) MG PO TABS
325.0000 mg | ORAL_TABLET | Freq: Two times a day (BID) | ORAL | Status: DC
  Administered 2021-12-17: 325 mg via ORAL
  Filled 2021-12-16: qty 1

## 2021-12-16 MED ORDER — FLUTICASONE FUROATE-VILANTEROL 100-25 MCG/ACT IN AEPB: 1.0000 | INHALATION_SPRAY | Freq: Every day | RESPIRATORY_TRACT | Status: DC

## 2021-12-16 MED ORDER — BUPROPION HCL ER (XL) 150 MG PO TB24
150.0000 mg | ORAL_TABLET | Freq: Every day | ORAL | Status: DC
  Administered 2021-12-17: 150 mg via ORAL
  Filled 2021-12-16: qty 1

## 2021-12-16 MED ORDER — ATORVASTATIN CALCIUM 20 MG PO TABS
40.0000 mg | ORAL_TABLET | Freq: Every day | ORAL | Status: DC
  Administered 2021-12-17: 40 mg via ORAL
  Filled 2021-12-16 (×2): qty 2

## 2021-12-16 MED ORDER — UMECLIDINIUM BROMIDE 62.5 MCG/ACT IN AEPB
1.0000 | INHALATION_SPRAY | Freq: Every day | RESPIRATORY_TRACT | Status: DC
Start: 2021-12-18 — End: 2021-12-17

## 2021-12-16 MED ORDER — SODIUM CHLORIDE 0.9 % IV SOLN: INTRAVENOUS | Status: DC

## 2021-12-16 MED ORDER — LORATADINE 10 MG PO TABS
10.0000 mg | ORAL_TABLET | Freq: Every day | ORAL | Status: DC
  Administered 2021-12-17: 10 mg via ORAL
  Filled 2021-12-16: qty 1

## 2021-12-16 MED ORDER — DAPAGLIFLOZIN PROPANEDIOL 10 MG PO TABS
10.0000 mg | ORAL_TABLET | Freq: Every day | ORAL | Status: DC
  Administered 2021-12-17: 10 mg via ORAL
  Filled 2021-12-16: qty 1

## 2021-12-16 MED ORDER — ACETAMINOPHEN 650 MG RE SUPP: 650.0000 mg | Freq: Four times a day (QID) | RECTAL | Status: DC | PRN

## 2021-12-16 MED ORDER — LACTATED RINGERS IV BOLUS
1000.0000 mL | Freq: Once | INTRAVENOUS | Status: AC
  Administered 2021-12-16: 1000 mL via INTRAVENOUS

## 2021-12-16 MED ORDER — MAGNESIUM HYDROXIDE 400 MG/5ML PO SUSP: 30.0000 mL | Freq: Every day | ORAL | Status: DC | PRN

## 2021-12-16 MED ORDER — SODIUM BICARBONATE 650 MG PO TABS
650.0000 mg | ORAL_TABLET | Freq: Two times a day (BID) | ORAL | Status: DC
  Administered 2021-12-17 (×2): 650 mg via ORAL
  Filled 2021-12-16 (×2): qty 1

## 2021-12-16 MED ORDER — TRAZODONE HCL 50 MG PO TABS: 25.0000 mg | ORAL_TABLET | Freq: Every evening | ORAL | Status: DC | PRN

## 2021-12-16 MED ORDER — ONDANSETRON HCL 4 MG/2ML IJ SOLN: 4.0000 mg | Freq: Four times a day (QID) | INTRAMUSCULAR | Status: DC | PRN

## 2021-12-16 MED ORDER — ONDANSETRON HCL 4 MG PO TABS: 4.0000 mg | ORAL_TABLET | Freq: Four times a day (QID) | ORAL | Status: DC | PRN

## 2021-12-16 MED ORDER — HYDRALAZINE HCL 10 MG PO TABS
10.0000 mg | ORAL_TABLET | Freq: Three times a day (TID) | ORAL | Status: DC
  Administered 2021-12-17 (×2): 10 mg via ORAL
  Filled 2021-12-16 (×2): qty 1

## 2021-12-16 MED ORDER — CALCIUM CARBONATE 1250 (500 CA) MG PO TABS
1.0000 | ORAL_TABLET | Freq: Three times a day (TID) | ORAL | Status: DC
  Administered 2021-12-17: 500 mg via ORAL
  Filled 2021-12-16: qty 1

## 2021-12-16 MED ORDER — VENLAFAXINE HCL ER 150 MG PO CP24
150.0000 mg | ORAL_CAPSULE | Freq: Every day | ORAL | Status: DC
  Administered 2021-12-17: 150 mg via ORAL
  Filled 2021-12-16: qty 1

## 2021-12-16 NOTE — ED Provider Notes (Signed)
? ?[facility] ?Provider Note ? ? ? Event Date/Time  ? First MD Initiated Contact with Patient 12/16/21 2027   ?  (approximate) ? ? ?History  ? ?Chief Complaint ?Weakness ? ? ?HPI ? ?Dawn Sherman is a 66 y.o. female with past medical history of hypertension, hyperlipidemia, chronic atrial fibrillation, CHF, and CKD who presents to the ED complaining of weakness.  Patient was recently discharged from the hospital 2 days ago following admission for traumatic SAH, bradycardia, and ITP.  Husband states that she has been slightly disoriented since returning home, forgetting where she is at times or what she is doing.  He states this is very unusual for her.  Patient reports that she has been feeling "numb all over" with sensation of generalized weakness.  She denies any focal numbness or weakness, denies any vision or speech changes.  She denies any fevers, cough, chest pain, shortness of breath, nausea, vomiting, abdominal pain, or dysuria.  She has not had any falls since leaving the hospital, has been holding her Eliquis as instructed. ?  ? ? ?Physical Exam  ? ?Triage Vital Signs: ?ED Triage Vitals  ?Enc Vitals Group  ?   BP 12/16/21 1954 (!) 146/75  ?   Pulse Rate 12/16/21 1954 89  ?   Resp 12/16/21 1954 20  ?   Temp 12/16/21 1954 98.3 ?F (36.8 ?C)  ?   Temp Source 12/16/21 1954 Oral  ?   SpO2 12/16/21 1954 100 %  ?   Weight 12/16/21 1955 114 lb (51.7 kg)  ?   Height 12/16/21 1955 5\' 5"  (1.651 m)  ?   Head Circumference --   ?   Peak Flow --   ?   Pain Score 12/16/21 1955 0  ?   Pain Loc --   ?   Pain Edu? --   ?   Excl. in [place]? --   ? ? ?Most recent vital signs: ?Vitals:  ? 12/16/21 2045 12/16/21 2100  ?BP:  (!) 144/65  ?Pulse:  86  ?Resp:  20  ?Temp:    ?SpO2: 100% 100%  ? ? ?Constitutional: Alert and oriented to person, place, time, and situation. ?Eyes: Conjunctivae are normal.  Pupils equal, round, and reactive to light bilaterally. ?Head: Atraumatic. ?Nose: No  congestion/rhinnorhea. ?Mouth/Throat: Mucous membranes are moist.  ?Cardiovascular: Normal rate, regular rhythm. Grossly normal heart sounds.  2+ radial pulses bilaterally. ?Respiratory: Normal respiratory effort.  No retractions. Lungs CTAB. ?Gastrointestinal: Soft and nontender. No distention. ?Musculoskeletal: No lower extremity tenderness nor edema.  ?Neurologic:  Normal speech and language. No gross focal neurologic deficits are appreciated. ? ? ? ?ED Results / Procedures / Treatments  ? ?Labs ?(all labs ordered are listed, but only abnormal results are displayed) ?Labs Reviewed  ?COMPREHENSIVE METABOLIC PANEL - Abnormal; Notable for the following components:  ?    Result Value  ? Sodium 129 (*)   ? CO2 19 (*)   ? BUN 50 (*)   ? Creatinine, Ser 2.21 (*)   ? Calcium 8.2 (*)   ? GFR, Estimated 24 (*)   ? All other components within normal limits  ?CBC - Abnormal; Notable for the following components:  ? WBC 12.5 (*)   ? Hemoglobin 10.9 (*)   ? HCT 33.8 (*)   ? RDW 18.5 (*)   ? All other components within normal limits  ?URINALYSIS, ROUTINE W REFLEX MICROSCOPIC - Abnormal; Notable for the following components:  ? Color, Urine STRAW (*)   ?  APPearance CLEAR (*)   ? Glucose, UA 50 (*)   ? Protein, ur 30 (*)   ? Bacteria, UA RARE (*)   ? All other components within normal limits  ?TROPONIN I (HIGH SENSITIVITY) - Abnormal; Notable for the following components:  ? Troponin I (High Sensitivity) 19 (*)   ? All other components within normal limits  ?MAGNESIUM  ?POC OCCULT BLOOD, ED  ?TYPE AND SCREEN  ? ? ? ?EKG ? ?ED ECG REPORT ?Tempie Hoist, the attending physician, personally viewed and interpreted this ECG. ? ? Date: 12/16/2021 ? EKG Time: 19:56 ? Rate: 90 ? Rhythm: normal sinus rhythm ? Axis: Normal ? Intervals:first-degree A-V block  ? ST&T Change: ST/T wave changes inferolaterally, improved from previous ? ?RADIOLOGY ?CT head reviewed by me with similar small subarachnoid compared to previous, no midline shift  noted. ? ?PROCEDURES: ? ?Critical Care performed: No ? ?Procedures ? ? ?MEDICATIONS ORDERED IN ED: ?Medications  ?lactated ringers bolus 1,000 mL (1,000 mLs Intravenous New Bag/Given 12/16/21 2145)  ? ? ? ?IMPRESSION / MDM / ASSESSMENT AND PLAN / ED COURSE  ?I reviewed the triage vital signs and the nursing notes. ?             ?               ? ?66 y.o. female with past medical history of hypertension, hyperlipidemia, chronic atrial fibrillation, CHF, CKD, and recent admission for SAH, bradycardia, and ITP who presents to the ED complaining of disorientation with generalized weakness and numbness for the past 2 days. ? ?Differential diagnosis includes, but is not limited to, worsening SAH, stroke, TIA, dehydration, electrolyte abnormality, AKI, UTI. ? ?Patient is nontoxic appearing and in no acute distress, vital signs are unremarkable.  EKG shows normal sinus rhythm with ST/T wave changes similar to previous but now improved.  Given abnormal EKG, we will check troponin, but overall low suspicion for ACS.  She has a nonfocal neurologic exam and is oriented x4, however with her recent [place], we will check CT head to ensure no worsening.  Additional labs are reassuring, anemia is improved from previous and while patient had reported black stools, this is likely related to supplemental iron.  Mild leukocytosis noted, however this is downtrending and she does not have any symptoms to suggest infection.  BMP does show mild hypokalemia as well as AKI compared to previous, we will hydrate with IV fluids. ? ?CT head is unremarkable, shows stable small subarachnoid hemorrhage.  Troponin is mildly elevated but again similar to previous, magnesium within normal limits.  Patient continues to feel generally weak with slight disorientation, suspect AKI due to dehydration given elevated BUN to creatinine ratio.  Case discussed with hospitalist for admission for further management of AKI. ? ?  ? ? ?FINAL CLINICAL IMPRESSION(S) / ED  DIAGNOSES  ? ?Final diagnoses:  ?Generalized weakness  ?AKI (acute kidney injury) ([place])  ? ? ? ?Rx / DC Orders  ? ?ED Discharge Orders   ? ? None  ? ?  ? ? ? ?Note:  This document was prepared using Dragon voice recognition software and may include unintentional dictation errors. ?  ?Blake Divine, MD ?12/16/21 2239 ? ?

## 2021-12-16 NOTE — H&P (Addendum)
?  ?  ?[facility] ? ? ?PATIENT NAME: Dawn Sherman   ? ?MR#:  [id number] ? ?DATE OF BIRTH:  August 30, 1955 ? ?DATE OF ADMISSION:  12/16/2021 ? ?PRIMARY CARE PHYSICIAN: [name], MD  ? ?Patient is coming from: Home ? ?REQUESTING/REFERRING PHYSICIAN: Blake Divine, MD ? ?CHIEF COMPLAINT:  ? ?Chief Complaint  ?Patient presents with  ? Weakness  ? ? ?HISTORY OF PRESENT ILLNESS:  ?ABISOLA CARRERO is a 66 y.o. Caucasian female with medical history significant for vitamin B12 deficiency, CHF, stage III CKD, depression, hypertension, dyslipidemia and tobacco abuse, who presented to the emergency room with acute onset of generalized weakness.  She was just discharged 2 days ago after being admitted here for traumatic subarachnoid hemorrhage, bradycardia and ITP.  She has been slightly disoriented since she went home per her husband, forgetting where she is at times or what she is doing.Marland Kitchen  Per her husband this is very unusual for her.  She has been feeling numb all over with sensation of generalized weakness.  She also admits to nausea and vomiting with diarrhea with loose bowel movements.  No fever or chills.  No dysuria, oliguria or hematuria, urgency or frequency or flank pain.  No focal muscle weakness or numbness.  No blurred vision or dizziness or diplopia.  No urinary or stool incontinence.  No witnessed seizures.  She denies any chest pain or palpitations.  No cough or wheezing. ? ?ED Course: Upon presenting to the emergency, BP was 146/75 with otherwise normal vital signs.  Labs revealed hyponatremia of 129 and CO2 of 19, BUN of 50 compared to 55 2 days ago and creatinine of 2.21 up from 1.872 days ago.  High-sensitivity troponin was 19.  CBC showed leukocytosis of 12.5 compared to 17.82 days ago.  UA came back with 30 protein and was otherwise unremarkable. ?EKG as reviewed by me : EKG showed normal sinus rhythm with a rate of 90 and first-degree AV block and Q waves anteroseptally with T wave inversion inferiorly  and laterally. ?Imaging: Head CT without contrast revealed unchanged small volume subarachnoid hemorrhage in the left sylvian fissure. ? ?The patient was given 1 L bolus of IV normal saline.  She will be admitted to an observation medical telemetry bed for further evaluation and management. ?PAST MEDICAL HISTORY:  ? ?Past Medical History:  ?Diagnosis Date  ? B12 deficiency   ? CHF (congestive heart failure) ([place])   ? CKD (chronic kidney disease), stage III ([place])   ? Depression   ? Endometriosis   ? Hypertension   ? Mixed hyperlipidemia   ? Tobacco abuse   ? ? ?PAST SURGICAL HISTORY:  ? ?Past Surgical History:  ?Procedure Laterality Date  ? ABDOMINAL HYSTERECTOMY    ? BREAST BIOPSY Right 2015  ? neg-  FIBROADENOMA  ? TAH/RSO  1999  ? secondary to bleeding and endometriosis (Dr Vernie Ammons)  ? ? ?SOCIAL HISTORY:  ? ?Social History  ? ?Tobacco Use  ? Smoking status: Every Day  ?  Packs/day: 0.25  ?  Years: 40.00  ?  Pack years: 10.00  ?  Types: Cigarettes  ? Smokeless tobacco: Never  ?Substance Use Topics  ? Alcohol use: Not Currently  ?  Alcohol/week: 0.0 standard drinks  ?  Comment: occasional  ? ? ?FAMILY HISTORY:  ? ?Family History  ?Problem Relation Age of Onset  ? Hypercholesterolemia Mother   ? Rheum arthritis Father   ? Rheum arthritis Daughter   ? Fibromyalgia Daughter   ?  Breast cancer Neg Hx   ? Colon cancer Neg Hx   ? ? ?DRUG ALLERGIES:  ? ?Allergies  ?Allergen Reactions  ? Sulfate Rash  ? Codeine Sulfate Nausea Only  ? Benicar [Olmesartan]   ?  Talked with patient February 10, 2020, intolerance is unclear, tried several medications around that time and one of them gave her a rash but she is not clear which 1.  ? Amoxicillin Rash  ? Clindamycin/Lincomycin Rash  ? Morphine And Related Rash  ? Penicillins Rash  ? ? ?REVIEW OF SYSTEMS:  ? ?ROS ?As per history of present illness. All pertinent systems were reviewed above. Constitutional, HEENT, cardiovascular, respiratory, GI, GU, musculoskeletal, neuro, psychiatric,  endocrine, integumentary and hematologic systems were reviewed and are otherwise negative/unremarkable except for positive findings mentioned above in the HPI. ? ? ?MEDICATIONS AT HOME:  ? ?Prior to Admission medications   ?Medication Sig Start Date End Date Taking? Authorizing Provider  ?acetaminophen (TYLENOL) 325 MG tablet Take 650 mg by mouth every 6 (six) hours as needed.    [provider]  ?amLODipine (NORVASC) 10 MG tablet Take 1 tablet (10 mg total) by mouth daily. 12/14/21   Fritzi Mandes, MD  ?atorvastatin (LIPITOR) 40 MG tablet TAKE 1 TABLET BY MOUTH EVERY DAY 08/10/21   [name], MD  ?buPROPion (WELLBUTRIN XL) 150 MG 24 hr tablet TAKE 1 TABLET BY MOUTH EVERY DAY 06/19/21   [name], MD  ?calcium carbonate (OS-CAL - DOSED IN MG OF ELEMENTAL CALCIUM) 1250 (500 Ca) MG tablet Take 1 tablet (500 mg of elemental calcium total) by mouth 3 (three) times daily with meals. 12/14/21   Fritzi Mandes, MD  ?[place] 10 MG TABS tablet Take 10 mg by mouth daily. 11/20/21   [provider]  ?ferrous sulfate 325 (65 FE) MG tablet Take 1 tablet (325 mg total) by mouth 2 (two) times daily with a meal. 12/14/21   Fritzi Mandes, MD  ?Fluticasone-Umeclidin-Vilant (TRELEGY ELLIPTA) 100-62.5-25 MCG/ACT AEPB Inhale 1 puff into the lungs daily. 08/26/21   Little Ishikawa, MD  ?hydrALAZINE (APRESOLINE) 10 MG tablet Take 1 tablet (10 mg total) by mouth 3 (three) times daily. 10/25/21   [name], MD  ?loratadine (CLARITIN) 10 MG tablet Take 10 mg by mouth daily.    [provider]  ?omeprazole (PRILOSEC) 20 MG capsule Take 20 mg by mouth daily.    [provider]  ?potassium & sodium phosphates (PHOS-NAK) 280-160-250 MG PACK Take 2 packets by mouth 4 (four) times daily -  with meals and at bedtime for 6 doses. 12/14/21 12/16/21  Fritzi Mandes, MD  ?sodium bicarbonate 650 MG tablet Take 1 tablet (650 mg total) by mouth 2 (two) times daily. 12/14/21   Fritzi Mandes, MD  ?venlafaxine XR  (EFFEXOR-XR) 150 MG 24 hr capsule TAKE 1 CAPSULE BY MOUTH EVERY DAY 11/27/21   [name], MD  ? ?  ? ?VITAL SIGNS:  ?Blood pressure (!) 155/69, pulse 82, temperature 98.3 ?F (36.8 ?C), temperature source Oral, resp. rate 17, height 5\' 5"  (1.651 m), weight 51.7 kg, SpO2 99 %. ? ?PHYSICAL EXAMINATION:  ?Physical Exam ? ?GENERAL:  66 y.o.-year-old Caucasian female patient lying in the bed with no acute distress.  ?EYES: Pupils equal, round, reactive to light and accommodation. No scleral icterus. Extraocular muscles intact.  ?HEENT: Head atraumatic, normocephalic. Oropharynx and nasopharynx clear.  ?NECK:  Supple, no jugular venous distention. No thyroid enlargement, no tenderness.  ?LUNGS: Normal breath sounds bilaterally, no wheezing, rales,rhonchi  or crepitation. No use of accessory muscles of respiration.  ?CARDIOVASCULAR: Regular rate and rhythm, S1, S2 normal. No murmurs, rubs, or gallops.  ?ABDOMEN: Soft, nondistended, nontender. Bowel sounds present. No organomegaly or mass.  ?EXTREMITIES: No pedal edema, cyanosis, or clubbing.  ?NEUROLOGIC: Cranial nerves II through XII are intact. Muscle strength 5/5 in all extremities. Sensation intact. Gait not checked.  ?PSYCHIATRIC: The patient is alert and oriented x 3.  Normal affect and good eye contact. ?SKIN: No obvious rash, lesion, or ulcer.  ? ?LABORATORY PANEL:  ? ?CBC ?Recent Labs  ?Lab 12/16/21 ?2001  ?WBC 12.5*  ?HGB 10.9*  ?HCT 33.8*  ?PLT 181  ? ?------------------------------------------------------------------------------------------------------------------ ? ?Chemistries  ?Recent Labs  ?Lab 12/16/21 ?2001  ?NA 129*  ?K 3.7  ?CL 101  ?CO2 19*  ?GLUCOSE 98  ?BUN 50*  ?CREATININE 2.21*  ?CALCIUM 8.2*  ?MG 1.9  ?AST 28  ?ALT 23  ?ALKPHOS 66  ?BILITOT 0.6  ? ?------------------------------------------------------------------------------------------------------------------ ? ?Cardiac Enzymes ?No results for input(s): TROPONINI in the last 168  hours. ?------------------------------------------------------------------------------------------------------------------ ? ?RADIOLOGY:  ?CT Head Wo Contrast ? ?Result Date: 12/16/2021 ?CLINICAL DATA:  Subarachnoid hemorrha

## 2021-12-16 NOTE — ED Triage Notes (Signed)
Pt states she has been having black stools and increased weakness since Friday when she was d/c'ed from the hospital- pt has not ben taking iron and was taken off her eloquis- pt denies abd pain ?

## 2021-12-16 NOTE — Assessment & Plan Note (Addendum)
-   The patient will be admitted to an observation telemetry medical bed. ?-This is likely prerenal secondary to acute gastroenteritis given her nausea, vomiting and diarrhea.  I suspect viral etiology. ?- We will continue hydration with IV normal saline. ?- We will follow BMP. ?- We will avoid nephrotoxins. ?- This is likely contributing to her generalized weakness. ?

## 2021-12-16 NOTE — ED Triage Notes (Signed)
FIRST NURSE NOTE:  Pt arrived via [facility] from home dc'd from hospital on Friday, pt had fall at home, had small hemorrhagic stroke. Since Friday pt has had black stools, pt is no longer on eliquis. Pt c/o weakness. ? ?VSS with EMS. ? ? ?

## 2021-12-17 ENCOUNTER — Ambulatory Visit: Payer: Medicare Other | Admitting: Family

## 2021-12-17 ENCOUNTER — Inpatient Hospital Stay: Payer: Medicare Other | Admitting: Internal Medicine

## 2021-12-17 DIAGNOSIS — F32A Depression, unspecified: Secondary | ICD-10-CM

## 2021-12-17 DIAGNOSIS — R531 Weakness: Secondary | ICD-10-CM

## 2021-12-17 DIAGNOSIS — K219 Gastro-esophageal reflux disease without esophagitis: Secondary | ICD-10-CM

## 2021-12-17 DIAGNOSIS — N179 Acute kidney failure, unspecified: Secondary | ICD-10-CM | POA: Diagnosis not present

## 2021-12-17 DIAGNOSIS — I5032 Chronic diastolic (congestive) heart failure: Secondary | ICD-10-CM

## 2021-12-17 DIAGNOSIS — F325 Major depressive disorder, single episode, in full remission: Secondary | ICD-10-CM

## 2021-12-17 DIAGNOSIS — I10 Essential (primary) hypertension: Secondary | ICD-10-CM

## 2021-12-17 DIAGNOSIS — E785 Hyperlipidemia, unspecified: Secondary | ICD-10-CM

## 2021-12-17 LAB — BASIC METABOLIC PANEL
Anion gap: 6 (ref 5–15)
BUN: 40 mg/dL — ABNORMAL HIGH (ref 8–23)
CO2: 21 mmol/L — ABNORMAL LOW (ref 22–32)
Calcium: 7.8 mg/dL — ABNORMAL LOW (ref 8.9–10.3)
Chloride: 110 mmol/L (ref 98–111)
Creatinine, Ser: 1.97 mg/dL — ABNORMAL HIGH (ref 0.44–1.00)
GFR, Estimated: 28 mL/min — ABNORMAL LOW (ref >60)
Glucose, Bld: 64 mg/dL — ABNORMAL LOW (ref 70–99)
Potassium: 3.8 mmol/L (ref 3.5–5.1)
Sodium: 137 mmol/L (ref 135–145)

## 2021-12-17 LAB — CBC
HCT: 31.1 % — ABNORMAL LOW (ref 36.0–46.0)
Hemoglobin: 10.1 g/dL — ABNORMAL LOW (ref 12.0–15.0)
MCH: 27 pg (ref 26.0–34.0)
MCHC: 32.5 g/dL (ref 30.0–36.0)
MCV: 83.2 fL (ref 80.0–100.0)
Platelets: 177 10*3/uL (ref 150–400)
RBC: 3.74 MIL/uL — ABNORMAL LOW (ref 3.87–5.11)
RDW: 18.1 % — ABNORMAL HIGH (ref 11.5–15.5)
WBC: 10 10*3/uL (ref 4.0–10.5)
nRBC: 0.3 % — ABNORMAL HIGH (ref 0.0–0.2)

## 2021-12-17 NOTE — Assessment & Plan Note (Signed)
-   We will continue PPI therapy 

## 2021-12-17 NOTE — Assessment & Plan Note (Signed)
-   We will continue her [place]. ?

## 2021-12-17 NOTE — Assessment & Plan Note (Signed)
-   We will continue Wellbutrin XL and Effexor XR. 

## 2021-12-17 NOTE — Assessment & Plan Note (Signed)
-   This likely secondary to her AKI and mild hyponatremia. ?- She will be hydrated with IV normal saline. ?-We will recheck her sodium level and renal functions. ?- We will obtain a PT consult. ?

## 2021-12-17 NOTE — Assessment & Plan Note (Addendum)
-   We will continue statin therapy. 

## 2021-12-17 NOTE — Assessment & Plan Note (Signed)
-   We will continue her antihypertensives including Norvasc and hydralazine. ?

## 2021-12-17 NOTE — Assessment & Plan Note (Addendum)
-   This is fairly stable. ?- This was associated with ITP that is currently improving. ?- We will avoid blood thinners. ?- We will monitor mental status. ?- Neurosurgery follow-up consultation can be obtained as needed ?

## 2021-12-17 NOTE — Discharge Summary (Signed)
Physician Discharge Summary  ?AYNSLEY [name] DOB: [birth year] DOA: 12/16/2021 ? ?PCP: [name], MD ? ?Admit date: 12/16/2021 ?Discharge date: 12/17/2021 ? ?Admitted From: Home ?Disposition: Home ? ?Recommendations for Outpatient Follow-up:  ?Follow up with PCP in 1-2 weeks ?Please obtain BMP/CBC in one week your next doctors visit.  ? ? ? ?Discharge Condition: Stable ?CODE STATUS: Full code ?Diet recommendation: Heart healthy ? ?Brief/Interim Summary: ?66 y.o. Caucasian female with medical history significant for vitamin B12 deficiency, CHF, stage III CKD, depression, hypertension, dyslipidemia and tobacco abuse, who presented to the emergency room with acute onset of generalized weakness.  She was just discharged 2 days ago after being admitted here for traumatic subarachnoid hemorrhage, bradycardia and ITP.  Upon admission patient was disoriented, CT head showed stable subarachnoid hemorrhage from previous admission.  She was given IV fluids, her nausea vomiting subsided.  She was tolerating orals without any issues. ?Following morning when I saw the patient her mentation was at baseline, ambulating without any issues.  She felt at her baseline. ?Patient is medically stable to be discharged. ? ? ?Assessment and Plan: ?* AKI (acute kidney injury) ([place]) ?-Suspect from poor oral intake from nausea vomiting.  This has subsided, she is tolerating orals without any issues.  Follow-up outpatient ? ?Generalized weakness ?Acute metabolic encephalopathy, resolved ?-Resolved with IV fluids, ambulating without any issues ? 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  Mentation is back at baseline.  CT head shows stable hemorrhage ?-During previous admission received Decadron and IVIG X2 doses. ? ?Chronic diastolic CHF (congestive heart failure) ([place]), EF 60% ?-Resume home medications ? ?GERD without esophagitis ?-Resume home  PPI ? ?Depression ?-Resume home meds ? ?Dyslipidemia ?-Continue statin ? ? ?Essential hypertension ?-Resume home meds ? ? ?  ?Body mass index is 18.97 kg/m?. ? ?  ? ? ? ?Discharge Diagnoses:  ?Principal Problem: ?  AKI (acute kidney injury) ([place]) ?Active Problems: ?  Generalized weakness ?  Subarachnoid hemorrhage ([place]) ?  Essential hypertension ?  Dyslipidemia ?  Depression ?  GERD without esophagitis ?  Chronic diastolic CHF (congestive heart failure) ([place]) ? ? ? ? ? ?Consultations: ?None ? ?Subjective: ?Feels great no complaints.  Ambulating without any issues.  Tells me this is her baseline. ? ?Discharge Exam: ?Vitals:  ? 12/17/21 0500 12/17/21 1000  ?BP: (!) 142/68 140/75  ?Pulse: 79 90  ?Resp: 18 20  ?Temp:  98.6 ?F (37 ?C)  ?SpO2: 96% 98%  ? ?Vitals:  ? 12/17/21 0118 12/17/21 0200 12/17/21 0500 12/17/21 1000  ?BP: 137/72 (!) 155/69 (!) 142/68 140/75  ?Pulse:  82 79 90  ?Resp:  17 18 20   ?Temp:    98.6 ?F (37 ?C)  ?TempSrc:      ?SpO2:  99% 96% 98%  ?Weight:      ?Height:      ? ? ?General: Pt is alert, awake, not in acute distress ?Cardiovascular: RRR, S1/S2 +, no rubs, no gallops ?Respiratory: CTA bilaterally, no wheezing, no rhonchi ?Abdominal: Soft, NT, ND, bowel sounds + ?Extremities: no edema, no cyanosis ? ?Discharge Instructions ? ? ?Allergies as of 12/17/2021   ? ?   Reactions  ? Sulfate Rash  ? Codeine Sulfate Nausea Only  ? Benicar [olmesartan]   ? Talked with patient February 10, 2020, intolerance is unclear, tried several medications around that time and one of them gave her a rash but she is not clear which 1.  ? Amoxicillin Rash  ?  Clindamycin/lincomycin Rash  ? Morphine And Related Rash  ? Penicillins Rash  ? ?  ? ?  ?Medication List  ?  ? ?STOP taking these medications   ? ?potassium & sodium phosphates 280-160-250 MG Pack ?Commonly known as: PHOS-NAK ?  ? ?  ? ?TAKE these medications   ? ?acetaminophen 325 MG tablet ?Commonly known as: TYLENOL ?Take 650 mg by mouth every 6 (six) hours as needed. ?   ?amLODipine 10 MG tablet ?Commonly known as: NORVASC ?Take 1 tablet (10 mg total) by mouth daily. ?  ?atorvastatin 40 MG tablet ?Commonly known as: LIPITOR ?TAKE 1 TABLET BY MOUTH EVERY DAY ?  ?buPROPion 150 MG 24 hr tablet ?Commonly known as: WELLBUTRIN XL ?TAKE 1 TABLET BY MOUTH EVERY DAY ?  ?calcium carbonate 1250 (500 Ca) MG tablet ?Commonly known as: OS-CAL - dosed in mg of elemental calcium ?Take 1 tablet (500 mg of elemental calcium total) by mouth 3 (three) times daily with meals. ?  ?Farxiga 10 MG Tabs tablet ?Generic drug: dapagliflozin propanediol ?Take 10 mg by mouth daily. ?  ?ferrous sulfate 325 (65 FE) MG tablet ?Take 1 tablet (325 mg total) by mouth 2 (two) times daily with a meal. ?  ?hydrALAZINE 10 MG tablet ?Commonly known as: APRESOLINE ?Take 1 tablet (10 mg total) by mouth 3 (three) times daily. ?  ?loratadine 10 MG tablet ?Commonly known as: CLARITIN ?Take 10 mg by mouth daily. ?  ?omeprazole 20 MG capsule ?Commonly known as: PRILOSEC ?Take 20 mg by mouth daily. ?  ?sodium bicarbonate 650 MG tablet ?Take 1 tablet (650 mg total) by mouth 2 (two) times daily. ?  ?Trelegy Ellipta 100-62.5-25 MCG/ACT Aepb ?Generic drug: Fluticasone-Umeclidin-Vilant ?Inhale 1 puff into the lungs daily. ?  ?venlafaxine XR 150 MG 24 hr capsule ?Commonly known as: EFFEXOR-XR ?TAKE 1 CAPSULE BY MOUTH EVERY DAY ?  ? ?  ? ? Follow-up Information   ? ? [name], MD Follow up in 1 week(s).   ?Specialty: Internal Medicine ?Contact information: ?[address] ?[address] ?[place] [address] ?[phone] ? ? ?  ?  ? ? Minna Merritts, MD .   ?Specialty: Cardiology ?Contact information: ?[address] ?[place] [address] ?[phone] ? ? ?  ?  ? ?  ?  ? ?  ? ?Allergies  ?Allergen Reactions  ? Sulfate Rash  ? Codeine Sulfate Nausea Only  ? Benicar [Olmesartan]   ?  Talked with patient February 10, 2020, intolerance is unclear, tried several medications around that time and one of them gave  her a rash but she is not clear which 1.  ? Amoxicillin Rash  ? Clindamycin/Lincomycin Rash  ? Morphine And Related Rash  ? Penicillins Rash  ? ? ?You were cared for by a hospitalist during your hospital stay. If you have any questions about your discharge medications or the care you received while you were in the hospital after you are discharged, you can call the unit and asked to speak with the hospitalist on call if the hospitalist that took care of you is not available. Once you are discharged, your primary care physician will handle any further medical issues. Please note that no refills for any discharge medications will be authorized once you are discharged, as it is imperative that you return to your primary care physician (or establish a relationship with a primary care physician if you do not have one) for your aftercare needs so that they can reassess  your need for medications and monitor your lab values. ? ? ?Procedures/Studies: ?CT ABDOMEN PELVIS WO CONTRAST ? ?Result Date: 12/10/2021 ?CLINICAL DATA:  Sepsis. EXAM: CT ABDOMEN AND PELVIS WITHOUT CONTRAST TECHNIQUE: Multidetector CT imaging of the abdomen and pelvis was performed following the standard protocol without IV contrast. RADIATION DOSE REDUCTION: This exam was performed according to the departmental dose-optimization program which includes automated exposure control, adjustment of the mA and/or kV according to patient size and/or use of iterative reconstruction technique. COMPARISON:  None. FINDINGS: Motion degraded scan, limiting assessment. Lower chest: No significant pulmonary nodules or acute consolidative airspace disease. Hepatobiliary: Normal liver size. No liver mass. Poorly visualized region of the gallbladder fossa due to motion degradation, unable to assess if the gallbladder is collapsed or absent. No biliary ductal dilatation. Pancreas: Normal, with no mass or duct dilation. Spleen: Normal size. No mass. Adrenals/Urinary Tract:  Normal adrenals. Severe asymmetric right renal atrophy. No hydronephrosis. No renal stones. No contour deforming renal masses. Normal bladder. Stomach/Bowel: Normal non-distended stomach. Normal caliber small bowel with no

## 2021-12-19 ENCOUNTER — Telehealth: Payer: Self-pay | Admitting: Family

## 2021-12-19 ENCOUNTER — Other Ambulatory Visit: Payer: Self-pay

## 2021-12-19 DIAGNOSIS — I5043 Acute on chronic combined systolic (congestive) and diastolic (congestive) heart failure: Secondary | ICD-10-CM

## 2021-12-19 NOTE — Telephone Encounter (Signed)
Returned message to patient. She says that she's gained 3 pounds overnight and has noticed that her ankles are puffy. Denies any SOB, CP or dizziness. BP at home today was 160/90.  ? ?She was recently in the ED and was given IVF. Furosemide has been on hold since recent discharge from the hospital. She has 40mg  furosemide tablets at home.  ? ?Advised her to take 1/2 tablet today and another 1/2 tablet tomorrow. She's to take 2 potassium packets today and tomorrow with the furosemide. She will call the office in 2 days and update on weight and symptoms.  ? ?Patient verbalized understanding and was appreciative of the call back.  ?

## 2021-12-20 ENCOUNTER — Other Ambulatory Visit: Payer: Self-pay

## 2021-12-20 DIAGNOSIS — I5043 Acute on chronic combined systolic (congestive) and diastolic (congestive) heart failure: Secondary | ICD-10-CM

## 2021-12-21 ENCOUNTER — Inpatient Hospital Stay: Payer: Medicare Other | Attending: Oncology

## 2021-12-21 ENCOUNTER — Ambulatory Visit: Payer: Medicare Other | Admitting: Oncology

## 2021-12-28 ENCOUNTER — Inpatient Hospital Stay: Payer: Medicare Other | Attending: Oncology

## 2021-12-28 ENCOUNTER — Inpatient Hospital Stay (HOSPITAL_BASED_OUTPATIENT_CLINIC_OR_DEPARTMENT_OTHER): Payer: Medicare Other | Admitting: Oncology

## 2021-12-28 ENCOUNTER — Telehealth: Payer: Self-pay

## 2021-12-28 ENCOUNTER — Encounter: Payer: Self-pay | Admitting: Oncology

## 2021-12-28 VITALS — BP 150/78 | HR 108 | Temp 97.8°F | Resp 16 | Wt 127.7 lb

## 2021-12-28 DIAGNOSIS — N183 Chronic kidney disease, stage 3 unspecified: Secondary | ICD-10-CM | POA: Insufficient documentation

## 2021-12-28 DIAGNOSIS — Z8679 Personal history of other diseases of the circulatory system: Secondary | ICD-10-CM | POA: Diagnosis not present

## 2021-12-28 DIAGNOSIS — D693 Immune thrombocytopenic purpura: Secondary | ICD-10-CM | POA: Diagnosis present

## 2021-12-28 DIAGNOSIS — I130 Hypertensive heart and chronic kidney disease with heart failure and stage 1 through stage 4 chronic kidney disease, or unspecified chronic kidney disease: Secondary | ICD-10-CM | POA: Insufficient documentation

## 2021-12-28 DIAGNOSIS — Z79899 Other long term (current) drug therapy: Secondary | ICD-10-CM | POA: Diagnosis not present

## 2021-12-28 DIAGNOSIS — I5043 Acute on chronic combined systolic (congestive) and diastolic (congestive) heart failure: Secondary | ICD-10-CM

## 2021-12-28 LAB — CBC WITH DIFFERENTIAL/PLATELET
Abs Immature Granulocytes: 0.02 10*3/uL (ref 0.00–0.07)
Basophils Absolute: 0.1 10*3/uL (ref 0.0–0.1)
Basophils Relative: 1 %
Eosinophils Absolute: 0.1 10*3/uL (ref 0.0–0.5)
Eosinophils Relative: 1 %
HCT: 32.4 % — ABNORMAL LOW (ref 36.0–46.0)
Hemoglobin: 10.3 g/dL — ABNORMAL LOW (ref 12.0–15.0)
Immature Granulocytes: 0 %
Lymphocytes Relative: 15 %
Lymphs Abs: 1.2 10*3/uL (ref 0.7–4.0)
MCH: 27.5 pg (ref 26.0–34.0)
MCHC: 31.8 g/dL (ref 30.0–36.0)
MCV: 86.4 fL (ref 80.0–100.0)
Monocytes Absolute: 0.8 10*3/uL (ref 0.1–1.0)
Monocytes Relative: 10 %
Neutro Abs: 5.7 10*3/uL (ref 1.7–7.7)
Neutrophils Relative %: 73 %
Platelets: 242 10*3/uL (ref 150–400)
RBC: 3.75 MIL/uL — ABNORMAL LOW (ref 3.87–5.11)
RDW: 18.1 % — ABNORMAL HIGH (ref 11.5–15.5)
WBC: 7.7 10*3/uL (ref 4.0–10.5)
nRBC: 0 % (ref 0.0–0.2)

## 2021-12-28 NOTE — Progress Notes (Signed)
? ? ? ?Hematology/Oncology Consult note ?[facility]  ?[phone] [phone] ? ?Patient Care Team: ?[name], MD as PCP - General (Internal Medicine) ?Minna Merritts, MD as PCP - Cardiology (Cardiology) ?Robert Bellow, MD as Consulting Physician (General Surgery)  ? ?Name of the patient: Dawn Sherman  ?[id number]  ?08/05/1956  ? ?Date of visit: 12/28/21 ? ?Diagnosis-ITP ? ?Chief complaint/ Reason for visit-routine follow-up of ITP ? ?Heme/Onc history: Patient is a 66 year old female with a past medical history significant for hypertension, A-fib on Eliquis, CKD who was admitted to the ER after she had a fall.  Patient experienced some generalized weakness and dizziness prior to the fall and felt like she blacked out and fell to the floor hitting the left side of her head and face.  She had a CT head without contrast which showed acute bilateral subarachnoid hemorrhages predominantly along the left sylvian fissure and multiple small foci in the left frontal lobe.  Linear focus of subarachnoid hemorrhage in the right temporal lobe and right frontal lobe.  No significant edema or midline shift.  Subsequent MRI angio head without contrast showed normal intracranial MRA.  Patient was seen by neurosurgery and no surgical intervention is recommended. ?  ?Eliquis was held.  Patient was noted to have a platelet count of 44 on admission which dropped down to 28 this morning.  Her prior platelet counts including 1 from January 2023 was normal at 204.  White cell count mildly elevated at 14 with an H&H of 11.5/34.1. ? ?Work-up was consistent with ITP and patient received 4 doses of Decadron and 2 doses of IVIG and platelet count normalized.  Eliquis is currently on hold. ? ?Interval history-patient is presently doing well.  Reports that her oral intake is improved.  Denies any specific complaints at this time.  Denies any repeated falls or bleeding or bruising. ? ?ECOG PS-  1 ?Pain scale- 0 ? ? ?Review of systems- Review of Systems  ?Constitutional:  Negative for chills, fever, malaise/fatigue and weight loss.  ?HENT:  Negative for congestion, ear discharge and nosebleeds.   ?Eyes:  Negative for blurred vision.  ?Respiratory:  Negative for cough, hemoptysis, sputum production, shortness of breath and wheezing.   ?Cardiovascular:  Negative for chest pain, palpitations, orthopnea and claudication.  ?Gastrointestinal:  Negative for abdominal pain, blood in stool, constipation, diarrhea, heartburn, melena, nausea and vomiting.  ?Genitourinary:  Negative for dysuria, flank pain, frequency, hematuria and urgency.  ?Musculoskeletal:  Negative for back pain, joint pain and myalgias.  ?Skin:  Negative for rash.  ?Neurological:  Negative for dizziness, tingling, focal weakness, seizures, weakness and headaches.  ?Endo/Heme/Allergies:  Does not bruise/bleed easily.  ?Psychiatric/Behavioral:  Negative for depression and suicidal ideas. The patient does not have insomnia.    ? ? ?Allergies  ?Allergen Reactions  ? Sulfate Rash  ? Codeine Sulfate Nausea Only  ? Benicar [Olmesartan]   ?  Talked with patient February 10, 2020, intolerance is unclear, tried several medications around that time and one of them gave her a rash but she is not clear which 1.  ? Amoxicillin Rash  ? Clindamycin/Lincomycin Rash  ? Morphine And Related Rash  ? Penicillins Rash  ? ? ? ?Past Medical History:  ?Diagnosis Date  ? B12 deficiency   ? CHF (congestive heart failure) ([place])   ? CKD (chronic kidney disease), stage III ([place])   ? Depression   ? Endometriosis   ? Hypertension   ? Mixed hyperlipidemia   ?  Tobacco abuse   ? ? ? ?Past Surgical History:  ?Procedure Laterality Date  ? ABDOMINAL HYSTERECTOMY    ? BREAST BIOPSY Right 2015  ? neg-  FIBROADENOMA  ? TAH/RSO  1999  ? secondary to bleeding and endometriosis (Dr Vernie Ammons)  ? ? ?Social History  ? ?Socioeconomic History  ? Marital status: Married  ?  Spouse name: Not on file   ? Number of children: Not on file  ? Years of education: Not on file  ? Highest education level: Not on file  ?Occupational History  ? Not on file  ?Tobacco Use  ? Smoking status: Every Day  ?  Packs/day: 0.25  ?  Years: 40.00  ?  Pack years: 10.00  ?  Types: Cigarettes  ? Smokeless tobacco: Never  ?Vaping Use  ? Vaping Use: Never used  ?Substance and Sexual Activity  ? Alcohol use: Not Currently  ?  Alcohol/week: 0.0 standard drinks  ?  Comment: occasional  ? Drug use: No  ? Sexual activity: Not Currently  ?Other Topics Concern  ? Not on file  ?Social History Narrative  ? Lives in [place] with her husband.  She is retired from [employer].  She does not routinely exercise.  ? ?Social Determinants of Health  ? ?Financial Resource Strain: Low Risk   ? Difficulty of Paying Living Expenses: Not hard at all  ?Food Insecurity: No Food Insecurity  ? Worried About Charity fundraiser in the Last Year: Never true  ? Ran Out of Food in the Last Year: Never true  ?Transportation Needs: No Transportation Needs  ? Lack of Transportation (Medical): No  ? Lack of Transportation (Non-Medical): No  ?Physical Activity: Sufficiently Active  ? Days of Exercise per Week: 7 days  ? Minutes of Exercise per Session: 30 min  ?Stress: No Stress Concern Present  ? Feeling of Stress : Not at all  ?Social Connections: Unknown  ? Frequency of Communication with Friends and Family: Not on file  ? Frequency of Social Gatherings with Friends and Family: Not on file  ? Attends Religious Services: Not on file  ? Active Member of Clubs or Organizations: Not on file  ? Attends Archivist Meetings: Not on file  ? Marital Status: Married  ?Intimate Partner Violence: Not At Risk  ? Fear of Current or Ex-Partner: No  ? Emotionally Abused: No  ? Physically Abused: No  ? Sexually Abused: No  ? ? ?Family History  ?Problem Relation Age of Onset  ? Hypercholesterolemia Mother   ? Rheum arthritis Father   ? Rheum arthritis Daughter   ?  Fibromyalgia Daughter   ? Breast cancer Neg Hx   ? Colon cancer Neg Hx   ? ? ? ?Current Outpatient Medications:  ?  acetaminophen (TYLENOL) 325 MG tablet, Take 650 mg by mouth every 6 (six) hours as needed., Disp: , Rfl:  ?  amLODipine (NORVASC) 10 MG tablet, Take 1 tablet (10 mg total) by mouth daily., Disp: 30 tablet, Rfl: 1 ?  atorvastatin (LIPITOR) 40 MG tablet, TAKE 1 TABLET BY MOUTH EVERY DAY, Disp: 90 tablet, Rfl: 1 ?  buPROPion (WELLBUTRIN XL) 150 MG 24 hr tablet, TAKE 1 TABLET BY MOUTH EVERY DAY, Disp: 90 tablet, Rfl: 1 ?  calcium carbonate (OS-CAL - DOSED IN MG OF ELEMENTAL CALCIUM) 1250 (500 Ca) MG tablet, Take 1 tablet (500 mg of elemental calcium total) by mouth 3 (three) times daily with meals., Disp: 60 tablet, Rfl:  1 ?  FARXIGA 10 MG TABS tablet, Take 10 mg by mouth daily., Disp: , Rfl:  ?  Fluticasone-Umeclidin-Vilant (TRELEGY ELLIPTA) 100-62.5-25 MCG/ACT AEPB, Inhale 1 puff into the lungs daily., Disp: 1 each, Rfl: 1 ?  hydrALAZINE (APRESOLINE) 10 MG tablet, Take 1 tablet (10 mg total) by mouth 3 (three) times daily., Disp: 90 tablet, Rfl: 2 ?  loratadine (CLARITIN) 10 MG tablet, Take 10 mg by mouth daily., Disp: , Rfl:  ?  omeprazole (PRILOSEC) 20 MG capsule, Take 20 mg by mouth daily., Disp: , Rfl:  ?  sodium bicarbonate 650 MG tablet, Take 1 tablet (650 mg total) by mouth 2 (two) times daily., Disp: 60 tablet, Rfl: 0 ?  venlafaxine XR (EFFEXOR-XR) 150 MG 24 hr capsule, TAKE 1 CAPSULE BY MOUTH EVERY DAY, Disp: 90 capsule, Rfl: 1 ?  ferrous sulfate 325 (65 FE) MG tablet, Take 1 tablet (325 mg total) by mouth 2 (two) times daily with a meal. (Patient not taking: Reported on 12/16/2021), Disp: 60 tablet, Rfl: 3 ?  PHOS-NAK 280-160-250 MG PACK, SMARTSIG:2 Packet(s) By Mouth 4 Times Daily (Patient not taking: Reported on 12/28/2021), Disp: , Rfl:  ? ?Physical exam:  ?Vitals:  ? 12/28/21 0920  ?BP: (!) 150/78  ?Pulse: (!) 108  ?Resp: 16  ?Temp: 97.8 ?F (36.6 ?C)  ?SpO2: 100%  ?Weight: 127 lb 11.2 oz (57.9  kg)  ? ?Physical Exam ?Cardiovascular:  ?   Rate and Rhythm: Normal rate and regular rhythm.  ?   Heart sounds: Normal heart sounds.  ?Pulmonary:  ?   Effort: Pulmonary effort is normal.  ?   Breath s

## 2021-12-28 NOTE — Telephone Encounter (Signed)
Have sent the referral to [facility] for brain bleed/when to restart Eliquis and have receieved a fax confirmation for [phone] ?

## 2021-12-31 ENCOUNTER — Ambulatory Visit: Payer: Medicare Other | Attending: Family | Admitting: Family

## 2021-12-31 ENCOUNTER — Encounter: Payer: Self-pay | Admitting: Family

## 2021-12-31 VITALS — BP 142/73 | HR 107 | Resp 18 | Ht 65.0 in | Wt 126.4 lb

## 2021-12-31 DIAGNOSIS — I4891 Unspecified atrial fibrillation: Secondary | ICD-10-CM | POA: Insufficient documentation

## 2021-12-31 DIAGNOSIS — E875 Hyperkalemia: Secondary | ICD-10-CM | POA: Diagnosis not present

## 2021-12-31 DIAGNOSIS — N182 Chronic kidney disease, stage 2 (mild): Secondary | ICD-10-CM | POA: Insufficient documentation

## 2021-12-31 DIAGNOSIS — F17210 Nicotine dependence, cigarettes, uncomplicated: Secondary | ICD-10-CM | POA: Diagnosis not present

## 2021-12-31 DIAGNOSIS — I480 Paroxysmal atrial fibrillation: Secondary | ICD-10-CM | POA: Diagnosis not present

## 2021-12-31 DIAGNOSIS — E785 Hyperlipidemia, unspecified: Secondary | ICD-10-CM | POA: Diagnosis not present

## 2021-12-31 DIAGNOSIS — I130 Hypertensive heart and chronic kidney disease with heart failure and stage 1 through stage 4 chronic kidney disease, or unspecified chronic kidney disease: Secondary | ICD-10-CM | POA: Insufficient documentation

## 2021-12-31 DIAGNOSIS — I890 Lymphedema, not elsewhere classified: Secondary | ICD-10-CM | POA: Insufficient documentation

## 2021-12-31 DIAGNOSIS — Z720 Tobacco use: Secondary | ICD-10-CM | POA: Diagnosis not present

## 2021-12-31 DIAGNOSIS — I5032 Chronic diastolic (congestive) heart failure: Secondary | ICD-10-CM | POA: Diagnosis present

## 2021-12-31 DIAGNOSIS — F32A Depression, unspecified: Secondary | ICD-10-CM | POA: Insufficient documentation

## 2021-12-31 DIAGNOSIS — I10 Essential (primary) hypertension: Secondary | ICD-10-CM

## 2021-12-31 DIAGNOSIS — Z79899 Other long term (current) drug therapy: Secondary | ICD-10-CM | POA: Insufficient documentation

## 2021-12-31 NOTE — Patient Instructions (Addendum)
Continue weighing daily and call for an overnight weight gain of 3 pounds or more or a weekly weight gain of more than 5 pounds. ? ? ?If you have voicemail, please make sure your mailbox is cleaned out so that we may leave a message and please make sure to listen to any voicemails.  ? ? ?Decrease amlodipine to 1/2 tablet daily (5mg ) to see if that helps with the leg swelling.  ? ? ?Get knee high compression socks but hold off until you see vascular tomorrow.  ? ? ?Drink about 40 ounces of fluids daily ?

## 2021-12-31 NOTE — Progress Notes (Signed)
? Patient ID: Dawn Sherman, female    DOB: 1956/01/21, 66 y.o.   MRN: [id number] ? ? ?Dawn Sherman is a 66 y/o female with a history of HTN, CKD, hyperlipidemia, depression, current tobacco use and chronic heart failure.  ? ?Echo report from 12/11/21 reviewed and showed an EF of 65-70% along with mild LVH/ LAE and mild MR. Echo report from 08/20/21 reviewed and showed EF of 50 to 55%. The left ventricle has low normal function. The left ventricle has no regional wall motion abnormalities. The left ventricular internal cavity size was mildly dilated. Echo report from 02/10/20 reviewed and showed an EF of 30-35% along with mildly elevated PA pressure, moderate MR and mild/moderate TR.  ? ?Was in the ED 12/16/21 due to weakness. Head CT unremarkable. Slight dehydration corrected and she was released. Admitted 12/10/21 due to a fall in the setting of bradycardia and developed subarachnoid hemorrhage. Cardiology, neurosurgery & hematology consults obtained. Head CT obtained. NOC stopped. Platelet transfusions needed for new ITP. Given decadron and 2 doses of IV IG. Hypokalemia corrected. Coreg held due to bradycardia. Discharged after 4 days. Admitted 08/19/21 due to COPD exacerbation. She was discharged home after 7 days with oxygen, however has not needed to use it since discharge. Her SPO2 is normally 95%. At discharge, her lasix was resumed.  ? ?She presents today for a follow- up visit with a chief complaint of pedal edema. She describes this as chronic although feels like her leg swelling has worsened lately. She has no other symptoms and specifically denies any dizziness, difficulty sleeping, abdominal distention, palpitations, chest pain, wheezing, shortness of breath, cough, fatigue or weight gain.  ? ?She says that she has an appointment at vein and vascular tomorrow but she doesn't know why. She is wearing mid shin compression socks bilaterally.  ? ?She says that she has no idea how much fluid she drinks but she  feels like it's a lot.  ? ?Past Medical History:  ?Diagnosis Date  ? B12 deficiency   ? CHF (congestive heart failure) ([facility])   ? CKD (chronic kidney disease), stage III ([facility])   ? Depression   ? Endometriosis   ? Hypertension   ? Mixed hyperlipidemia   ? Tobacco abuse   ? ?Past Surgical History:  ?Procedure Laterality Date  ? ABDOMINAL HYSTERECTOMY    ? BREAST BIOPSY Right 2015  ? neg-  FIBROADENOMA  ? TAH/RSO  1999  ? secondary to bleeding and endometriosis (Dr Vernie Ammons)  ? ?Family History  ?Problem Relation Age of Onset  ? Hypercholesterolemia Mother   ? Rheum arthritis Father   ? Rheum arthritis Daughter   ? Fibromyalgia Daughter   ? Breast cancer Neg Hx   ? Colon cancer Neg Hx   ? ?Social History  ? ?Tobacco Use  ? Smoking status: Every Day  ?  Packs/day: 0.25  ?  Years: 40.00  ?  Pack years: 10.00  ?  Types: Cigarettes  ? Smokeless tobacco: Never  ?Substance Use Topics  ? Alcohol use: Not Currently  ?  Alcohol/week: 0.0 standard drinks  ?  Comment: occasional  ? ?Allergies  ?Allergen Reactions  ? Sulfate Rash  ? Codeine Sulfate Nausea Only  ? Benicar [Olmesartan]   ?  Talked with patient February 10, 2020, intolerance is unclear, tried several medications around that time and one of them gave her a rash but she is not clear which 1.  ? Amoxicillin Rash  ? Clindamycin/Lincomycin Rash  ? Morphine  And Related Rash  ? Penicillins Rash  ? ?Prior to Admission medications   ?Medication Sig Start Date End Date Taking? Authorizing Provider  ?acetaminophen (TYLENOL) 325 MG tablet Take 650 mg by mouth every 6 (six) hours as needed.   Yes [provider]  ?amLODipine (NORVASC) 10 MG tablet Take 1 tablet (10 mg total) by mouth daily. 12/14/21  Yes Fritzi Mandes, MD  ?atorvastatin (LIPITOR) 40 MG tablet TAKE 1 TABLET BY MOUTH EVERY DAY 08/10/21  Yes [name], MD  ?buPROPion (WELLBUTRIN XL) 150 MG 24 hr tablet TAKE 1 TABLET BY MOUTH EVERY DAY 06/19/21  Yes [name], MD  ?calcium carbonate (OS-CAL - DOSED IN MG  OF ELEMENTAL CALCIUM) 1250 (500 Ca) MG tablet Take 1 tablet (500 mg of elemental calcium total) by mouth 3 (three) times daily with meals. 12/14/21  Yes Fritzi Mandes, MD  ?FARXIGA 10 MG TABS tablet Take 10 mg by mouth daily. 11/20/21  Yes [provider]  ?ferrous sulfate 325 (65 FE) MG tablet Take 1 tablet (325 mg total) by mouth 2 (two) times daily with a meal. 12/14/21  Yes Fritzi Mandes, MD  ?Fluticasone-Umeclidin-Vilant (TRELEGY ELLIPTA) 100-62.5-25 MCG/ACT AEPB Inhale 1 puff into the lungs daily. 08/26/21  Yes Little Ishikawa, MD  ?furosemide (LASIX) 40 MG tablet Take 40 mg by mouth every other day.   Yes [provider]  ?hydrALAZINE (APRESOLINE) 10 MG tablet Take 1 tablet (10 mg total) by mouth 3 (three) times daily. 10/25/21  Yes [name], MD  ?loratadine (CLARITIN) 10 MG tablet Take 10 mg by mouth daily.   Yes [provider]  ?omeprazole (PRILOSEC) 20 MG capsule Take 20 mg by mouth daily.   Yes [provider]  ?PHOS-NAK 280-160-250 [employer] PACK  12/17/21  Yes [provider]  ?sodium bicarbonate 650 MG tablet Take 1 tablet (650 mg total) by mouth 2 (two) times daily. 12/14/21  Yes Fritzi Mandes, MD  ?venlafaxine XR (EFFEXOR-XR) 150 MG 24 hr capsule TAKE 1 CAPSULE BY MOUTH EVERY DAY 11/27/21  Yes [name], MD  ? ?Review of Systems  ?Constitutional:  Negative for appetite change and fatigue.  ?HENT:  Negative for congestion, postnasal drip and sore throat.   ?Eyes: Negative.   ?Respiratory:  Negative for cough, shortness of breath and wheezing.   ?Cardiovascular:  Positive for leg swelling. Negative for chest pain and palpitations.  ?Gastrointestinal:  Negative for abdominal distention and abdominal pain.  ?Endocrine: Negative.   ?Genitourinary: Negative.   ?Musculoskeletal:  Negative for back pain and neck pain.  ?Skin: Negative.   ?Allergic/Immunologic: Negative.   ?Neurological:  Negative for dizziness and light-headedness.  ?Hematological:  Negative for  adenopathy. Does not bruise/bleed easily.  ?Psychiatric/Behavioral:  Negative for dysphoric mood and sleep disturbance (sleeps on 1 pillow). The patient is not nervous/anxious.   ? ?Vitals:  ? 12/31/21 1219  ?BP: (!) 142/73  ?Pulse: (!) 107  ?Resp: 18  ?SpO2: 100%  ?Weight: 126 lb 6 oz (57.3 kg)  ?Height: 5\' 5"  (1.651 m)  ? ?Wt Readings from Last 3 Encounters:  ?12/31/21 126 lb 6 oz (57.3 kg)  ?12/28/21 127 lb 11.2 oz (57.9 kg)  ?12/16/21 114 lb (51.7 kg)  ? ?Lab Results  ?Component Value Date  ? CREATININE 1.97 (H) 12/17/2021  ? CREATININE 2.21 (H) 12/16/2021  ? CREATININE 1.87 (H) 12/14/2021  ? ?Physical Exam ?Vitals and nursing note reviewed.  ?Constitutional:   ?   General: She is not in acute distress. ?  Appearance: Normal appearance. She is not ill-appearing.  ?HENT:  ?   Head: Normocephalic and atraumatic.  ?Cardiovascular:  ?   Rate and Rhythm: Regular rhythm. Tachycardia present.  ?   Heart sounds: No murmur heard. ?Pulmonary:  ?   Effort: Pulmonary effort is normal. No respiratory distress.  ?   Breath sounds: No wheezing or rales.  ?Abdominal:  ?   General: There is no distension.  ?   Palpations: Abdomen is soft.  ?   Tenderness: There is no abdominal tenderness.  ?Musculoskeletal:     ?   General: No tenderness.  ?   Cervical back: Normal range of motion and neck supple.  ?   Right lower leg: Edema (2+ pitting) present.  ?   Left lower leg: Edema (2+ pitting) present.  ?Skin: ?   General: Skin is warm and dry.  ?Neurological:  ?   General: No focal deficit present.  ?   Mental Status: She is alert and oriented to person, place, and time.  ?Psychiatric:     ?   Mood and Affect: Mood normal.     ?   Behavior: Behavior normal.     ?   Thought Content: Thought content normal.  ? ?Assessment & Plan: ? ?1: Chronic heart failure with preserved ejection fraction with structural changes- ?- NYHA class I ?- euvolemic today ?- weighing daily; reminded to call for an overnight weight gain of >2 pounds or a weekly  weight gain of >5 pounds ?- weight unchanged from last visit here 3.5 months ago ?- not adding salt to her food ?- she's unsure of her fluid intake but feels like it's probably "too much"; reviewed the impor

## 2022-01-01 ENCOUNTER — Ambulatory Visit (INDEPENDENT_AMBULATORY_CARE_PROVIDER_SITE_OTHER): Payer: Medicare Other | Admitting: Nurse Practitioner

## 2022-01-01 ENCOUNTER — Encounter (INDEPENDENT_AMBULATORY_CARE_PROVIDER_SITE_OTHER): Payer: Self-pay | Admitting: Nurse Practitioner

## 2022-01-01 VITALS — BP 153/80 | HR 106 | Resp 17 | Ht 65.0 in | Wt 123.8 lb

## 2022-01-01 DIAGNOSIS — I10 Essential (primary) hypertension: Secondary | ICD-10-CM | POA: Diagnosis not present

## 2022-01-01 DIAGNOSIS — I5032 Chronic diastolic (congestive) heart failure: Secondary | ICD-10-CM | POA: Diagnosis not present

## 2022-01-01 DIAGNOSIS — M7989 Other specified soft tissue disorders: Secondary | ICD-10-CM

## 2022-01-01 NOTE — Progress Notes (Signed)
Subjective:    Patient ID: Dawn Sherman, female    DOB: 18-Nov-1955, 66 y.o.   MRN: [id number] Chief Complaint  Patient presents with   Establish Care    Referred by Dr Lorin Picket    Dawn Sherman is a 66 year old female that presents today as a referral from her primary care provider Dr. Lorin Picket in the setting of lower extremity leg swelling.  The patient notes that she has been having swelling for the last 3 to 4 weeks and this most recently happened after changes in her heart medications.  She notes that she had stopped her Lasix as well as her Lipitor.  She notes that she was recently reinstated on her Lasix but instead of daily it is every other day.  The patient is try to utilize medical grade compression stockings however these were the crew sock length and not full knee length.  She also elevates her lower extremities when possible.  Currently there are no open wounds or ulcerations or weeping.   Review of Systems  Cardiovascular:  Positive for leg swelling.  All other systems reviewed and are negative.     Objective:   Physical Exam Vitals reviewed.  HENT:     Head: Normocephalic.  Cardiovascular:     Rate and Rhythm: Normal rate.  Pulmonary:     Effort: Pulmonary effort is normal.  Musculoskeletal:     Right lower leg: Edema present.     Left lower leg: Edema present.  Skin:    General: Skin is warm and dry.  Neurological:     Mental Status: She is alert and oriented to person, place, and time.  Psychiatric:        Mood and Affect: Mood normal.        Behavior: Behavior normal.        Thought Content: Thought content normal.        Judgment: Judgment normal.    BP (!) 153/80 (BP Location: Left Arm)   Pulse (!) 106   Resp 17   Ht 5\' 5"  (1.651 m)   Wt 123 lb 12.8 oz (56.2 kg)   BMI 20.60 kg/m   Past Medical History:  Diagnosis Date   B12 deficiency    CHF (congestive heart failure) ([facility])    CKD (chronic kidney disease), stage III (H[facility])    Depression     Endometriosis    Hypertension    Mixed hyperlipidemia    Tobacco abuse     Social History   Socioeconomic History   Marital status: Married    Spouse name: Not on file   Number of children: Not on file   Years of education: Not on file   Highest education level: Not on file  Occupational History   Not on file  Tobacco Use   Smoking status: Every Day    Packs/day: 0.25    Years: 40.00    Pack years: 10.00    Types: Cigarettes   Smokeless tobacco: Never  Vaping Use   Vaping Use: Never used  Substance and Sexual Activity   Alcohol use: Not Currently    Alcohol/week: 0.0 standard drinks    Comment: occasional   Drug use: No   Sexual activity: Not Currently  Other Topics Concern   Not on file  Social History Narrative   Lives in [place] with her husband.  She is retired from [employer]	[place].  She does not routinely exercise.   Social Determinants of Health  Financial Resource Strain: Low Risk    Difficulty of Paying Living Expenses: Not hard at all  Food Insecurity: No Food Insecurity   Worried About Programme researcher, broadcasting/film/video in the Last Year: Never true   Ran Out of Food in the Last Year: Never true  Transportation Needs: No Transportation Needs   Lack of Transportation (Medical): No   Lack of Transportation (Non-Medical): No  Physical Activity: Sufficiently Active   Days of Exercise per Week: 7 days   Minutes of Exercise per Session: 30 min  Stress: No Stress Concern Present   Feeling of Stress : Not at all  Social Connections: Unknown   Frequency of Communication with Friends and Family: Not on file   Frequency of Social Gatherings with Friends and Family: Not on file   Attends Religious Services: Not on Scientist, clinical (histocompatibility and immunogenetics) or Organizations: Not on file   Attends Banker Meetings: Not on file   Marital Status: Married  Catering manager Violence: Not At Risk   Fear of Current or Ex-Partner: No   Emotionally Abused: No   Physically  Abused: No   Sexually Abused: No    Past Surgical History:  Procedure Laterality Date   ABDOMINAL HYSTERECTOMY     BREAST BIOPSY Right 2015   neg-  FIBROADENOMA   TAH/RSO  1999   secondary to bleeding and endometriosis (Dr Haskel Khan)    Family History  Problem Relation Age of Onset   Hypercholesterolemia Mother    Rheum arthritis Father    Rheum arthritis Daughter    Fibromyalgia Daughter    Breast cancer Neg Hx    Colon cancer Neg Hx     Allergies  Allergen Reactions   Sulfate Rash   Codeine Sulfate Nausea Only   Benicar [Olmesartan]     Talked with patient February 10, 2020, intolerance is unclear, tried several medications around that time and one of them gave her a rash but she is not clear which 1.   Amoxicillin Rash   Clindamycin/Lincomycin Rash   Morphine And Related Rash   Penicillins Rash       Latest Ref Rng & Units 12/28/2021    9:01 AM 12/17/2021    5:00 AM 12/16/2021    8:01 PM  CBC  WBC 4.0 - 10.5 K/uL 7.7   10.0   12.5    Hemoglobin 12.0 - 15.0 g/dL 40.9   81.1   91.4    Hematocrit 36.0 - 46.0 % 32.4   31.1   33.8    Platelets 150 - 400 K/uL 242   177   181        CMP     Component Value Date/Time   NA 137 12/17/2021 0500   NA 134 09/05/2020 1023   K 3.8 12/17/2021 0500   CL 110 12/17/2021 0500   CO2 21 (L) 12/17/2021 0500   GLUCOSE 64 (L) 12/17/2021 0500   BUN 40 (H) 12/17/2021 0500   BUN 17 09/05/2020 1023   CREATININE 1.97 (H) 12/17/2021 0500   CALCIUM 7.8 (L) 12/17/2021 0500   PROT 7.7 12/16/2021 2001   ALBUMIN 3.5 12/16/2021 2001   AST 28 12/16/2021 2001   ALT 23 12/16/2021 2001   ALKPHOS 66 12/16/2021 2001   BILITOT 0.6 12/16/2021 2001   GFRNONAA 28 (L) 12/17/2021 0500   GFRAA 38 (L) 09/05/2020 1023     No results found.     Assessment & Plan:   1. Leg swelling  The patient's lower extremity leg swelling is likely multifactorial in cause.  I suspect a large reason for her recent swelling is the changes to her diuretics.  She  notes that she went from taking Lasix daily to every other day.  The patient also had a recent acute kidney injury may account for why her medications were recently changed.  A large part of controlling the patient's edema will depend on her optimization of her heart medications.  She will continue to follow with her PCP as well as the heart failure clinic for this.  The patient does have some notable varicosities as well.  We will have her return for a bilateral venous reflux to ensure that there is no venous insufficiency also contributing to her edema.  We also discussed that conservative therapy will also be very helpful with controlling her edema.  The patient is advised to utilize knee-high compression stockings to be placed daily.  She should also elevate her lower extremities when possible as well as to be active when possible.  We will have the patient follow-up at her convenience with status.  2. Chronic diastolic CHF (congestive heart failure) (HCC) This likely plays a large role in the patient's swelling especially with her recent medication changes.  3. Essential hypertension Continue antihypertensive medications as already ordered, these medications have been reviewed and there are no changes at this time.    Current Outpatient Medications on File Prior to Visit  Medication Sig Dispense Refill   acetaminophen (TYLENOL) 325 MG tablet Take 650 mg by mouth every 6 (six) hours as needed.     amLODipine (NORVASC) 10 MG tablet Take 1 tablet (10 mg total) by mouth daily. (Patient taking differently: Take 5 mg by mouth daily.) 30 tablet 1   atorvastatin (LIPITOR) 40 MG tablet TAKE 1 TABLET BY MOUTH EVERY DAY 90 tablet 1   buPROPion (WELLBUTRIN XL) 150 MG 24 hr tablet TAKE 1 TABLET BY MOUTH EVERY DAY 90 tablet 1   calcium carbonate (OS-CAL - DOSED IN MG OF ELEMENTAL CALCIUM) 1250 (500 Ca) MG tablet Take 1 tablet (500 mg of elemental calcium total) by mouth 3 (three) times daily with meals. 60  tablet 1   FARXIGA 10 MG TABS tablet Take 10 mg by mouth daily.     ferrous sulfate 325 (65 FE) MG tablet Take 1 tablet (325 mg total) by mouth 2 (two) times daily with a meal. 60 tablet 3   Fluticasone-Umeclidin-Vilant (TRELEGY ELLIPTA) 100-62.5-25 MCG/ACT AEPB Inhale 1 puff into the lungs daily. 1 each 1   furosemide (LASIX) 40 MG tablet Take 40 mg by mouth every other day.     hydrALAZINE (APRESOLINE) 10 MG tablet Take 1 tablet (10 mg total) by mouth 3 (three) times daily. 90 tablet 2   loratadine (CLARITIN) 10 MG tablet Take 10 mg by mouth daily.     omeprazole (PRILOSEC) 20 MG capsule Take 20 mg by mouth daily.     PHOS-NAK 280-160-250 MG PACK      sodium bicarbonate 650 MG tablet Take 1 tablet (650 mg total) by mouth 2 (two) times daily. 60 tablet 0   venlafaxine XR (EFFEXOR-XR) 150 MG 24 hr capsule TAKE 1 CAPSULE BY MOUTH EVERY DAY 90 capsule 1   No current facility-administered medications on file prior to visit.    There are no Patient Instructions on file for this visit. No follow-ups on file.   Georgiana Spinner, NP

## 2022-01-04 ENCOUNTER — Telehealth: Payer: Medicare Other

## 2022-01-08 ENCOUNTER — Other Ambulatory Visit: Payer: Self-pay | Admitting: Internal Medicine

## 2022-01-20 ENCOUNTER — Other Ambulatory Visit: Payer: Self-pay | Admitting: Internal Medicine

## 2022-01-22 ENCOUNTER — Telehealth: Payer: Self-pay | Admitting: Internal Medicine

## 2022-01-22 NOTE — Telephone Encounter (Signed)
Patient called and is requesting samples of Fluticasone-Umeclidin-Vilant (TRELEGY ELLIPTA) 100-62.5-25 MCG/ACT AEPB. She can not afford Trelegy.

## 2022-01-22 NOTE — Telephone Encounter (Signed)
THN-CM referral faxed to [facility]

## 2022-01-23 NOTE — Telephone Encounter (Signed)
If doing well, recommend staying on her current dose.  Please refer her to pt assistance or see if we can get pt assistance for her.  If a problem, let me know.

## 2022-01-29 ENCOUNTER — Telehealth: Payer: Self-pay | Admitting: Pharmacist

## 2022-01-29 DIAGNOSIS — Z596 Low income: Secondary | ICD-10-CM

## 2022-01-29 NOTE — Progress Notes (Signed)
[facility]) Care Management  [place]   01/29/2022  Dawn Sherman [birth year] [id number]   Reason for referral: Medication Assistance  Referral source: Provider Current insurance:Medicare/[employer]   HPI:  66 year old female with multiple medical conditions including but not limited to:  prediabetes (HgA1c 6.2), GERD, Hyperlipidemia, CKD stage III, Depression, Afib, and tobacco abuse.     Objective: Patient reported difficulty affording Trelegy and [place].  Lab Results  Component Value Date   CREATININE 1.97 (H) 12/17/2021   CREATININE 2.21 (H) 12/16/2021   CREATININE 1.87 (H) 12/14/2021    Lab Results  Component Value Date   HGBA1C 6.2 06/26/2021    Lipid Panel     Component Value Date/Time   CHOL 180 02/23/2021 1210   TRIG 95.0 02/23/2021 1210   HDL 76.10 02/23/2021 1210   CHOLHDL 2 02/23/2021 1210   VLDL 19.0 02/23/2021 1210   LDLCALC 85 02/23/2021 1210   LDLDIRECT 53.3 10/14/2012 0821    BP Readings from Last 3 Encounters:  01/01/22 (!) 153/80  12/31/21 (!) 142/73  12/28/21 (!) 150/78    Allergies  Allergen Reactions   Sulfate Rash   Codeine Sulfate Nausea Only   Benicar [Olmesartan]     Talked with patient February 10, 2020, intolerance is unclear, tried several medications around that time and one of them gave her a rash but she is not clear which 1.   Amoxicillin Rash   Clindamycin/Lincomycin Rash   Morphine And Related Rash   Penicillins Rash    Medications Reviewed Today     Reviewed by Kris Hartmann, NP (Nurse Practitioner) on 01/01/22 at [phone]  Med List Status: <None>   Medication Order Taking? Sig Documenting Provider Last Dose Status Informant  acetaminophen (TYLENOL) 325 MG tablet [id number] Yes Take 650 mg by mouth every 6 (six) hours as needed. [provider] Taking Active Self  amLODipine (NORVASC) 10 MG tablet [id number] Yes Take 1 tablet (10 mg total) by mouth daily.  Patient taking differently:  Take 5 mg by mouth daily.   Fritzi Mandes, MD Taking Active Self  atorvastatin (LIPITOR) 40 MG tablet [id number] Yes TAKE 1 TABLET BY MOUTH EVERY DAY [name], MD Taking Active Self           Med Note Selinda Michaels Aug 19, 2021 11:56 PM) Last filled 08/10/21 90 day supply  buPROPion (WELLBUTRIN XL) 150 MG 24 hr tablet [id number] Yes TAKE 1 TABLET BY MOUTH EVERY Lemont Fillers, MD Taking Active Self           Med Note Briant Cedar, Thad Ranger Aug 19, 2021 11:57 PM) Last filled 06/19/21 90 day supply  calcium carbonate (OS-CAL - DOSED IN MG OF ELEMENTAL CALCIUM) 1250 (500 Ca) MG tablet [id number] Yes Take 1 tablet (500 mg of elemental calcium total) by mouth 3 (three) times daily with meals. Fritzi Mandes, MD Taking Active Self  FARXIGA 10 MG TABS tablet [id number] Yes Take 10 mg by mouth daily. [provider] Taking Active Self  ferrous sulfate 325 (65 FE) MG tablet [id number] Yes Take 1 tablet (325 mg total) by mouth 2 (two) times daily with a meal. Fritzi Mandes, MD Taking Active Self  Fluticasone-Umeclidin-Vilant (TRELEGY ELLIPTA) 100-62.5-25 MCG/ACT AEPB [id number] Yes Inhale 1 puff into the lungs daily. Little Ishikawa, MD Taking Active Self           Med Note [facility], TINA A   Mon Dec 31, 2021 12:31 PM)    furosemide (LASIX) 40 MG tablet [id number] Yes Take 40 mg by mouth every other day. [provider] Taking Active   hydrALAZINE (APRESOLINE) 10 MG tablet [id number] Yes Take 1 tablet (10 mg total) by mouth 3 (three) times daily. [name], MD Taking Active Self  loratadine (CLARITIN) 10 MG tablet [id number] Yes Take 10 mg by mouth daily. [provider] Taking Active Self  omeprazole (PRILOSEC) 20 MG capsule [id number] Yes Take 20 mg by mouth daily. [provider] Taking Active Self  PHOS-NAK 280-160-250 Elinor Parkinson [id number] Yes  [provider] Taking Active   sodium bicarbonate 650 MG tablet [id number] Yes Take 1 tablet (650  mg total) by mouth 2 (two) times daily. Fritzi Mandes, MD Taking Active Self  venlafaxine XR (EFFEXOR-XR) 150 MG 24 hr capsule [id number] Yes TAKE 1 CAPSULE BY MOUTH EVERY DAY [name], MD Taking Active Self             ASSESSMENT: Drugs sorted by system:  Neurologic/Psychologic: Venlafaxine Bupropion  Cardiovascular: Hydralazine Furosemide Atorvastatin Amlodipine   Pulmonary/Allergy: Trelegy Loratadine  Gastrointestinal: Omeprazole  Endocrine: Farxiga Calcium Carbonate  Renal: Sodium Bicarbonate PHOS-NAK  Pain: Acetaminophen  Vitamins/Minerals/Supplements: Ferrous Sulfate   Medication Assistance Findings:  From initial review, patient should qualify for both [place] and Trelegy.  She was informed about the necessity to show proof of spending at least $600 in medication expenses.  She communicated understanding on the necessity of getting a print out from her local pharmacy. Patient also stated she would send in a copy of the first two pages of her tax return.  Plan:  Patient assistance letter will be uploaded to [place] for Jill Simcox, CPhT to print off and send to the patient along with the necessary application paperwork to the patient via mail.  Sharee Pimple will also out reach the patient's provider for any necessary signatures and documentation.  Once all information is received, Sharee Pimple will coordinate with the Patient Assistance Programs.  The process usually takes 6-8 weeks before the patient will receive any medications (if approved).  This time can be lengthened if we do not receive requested paperwork in a timely fashion.   Elayne Guerin, PharmD, [place] Clinical Pharmacist [phone]

## 2022-01-30 ENCOUNTER — Ambulatory Visit (INDEPENDENT_AMBULATORY_CARE_PROVIDER_SITE_OTHER): Payer: Medicare Other | Admitting: Nurse Practitioner

## 2022-01-30 ENCOUNTER — Telehealth: Payer: Self-pay | Admitting: *Deleted

## 2022-01-30 ENCOUNTER — Encounter (INDEPENDENT_AMBULATORY_CARE_PROVIDER_SITE_OTHER): Payer: Medicare Other

## 2022-01-30 NOTE — Telephone Encounter (Signed)
Patient called asking for referral to Neurology

## 2022-01-31 ENCOUNTER — Encounter: Payer: Self-pay | Admitting: Family

## 2022-01-31 ENCOUNTER — Ambulatory Visit: Payer: Medicare Other | Attending: Family | Admitting: Family

## 2022-01-31 ENCOUNTER — Telehealth: Payer: Self-pay | Admitting: *Deleted

## 2022-01-31 VITALS — BP 154/67 | HR 90 | Resp 16 | Ht 65.0 in | Wt 123.2 lb

## 2022-01-31 DIAGNOSIS — I480 Paroxysmal atrial fibrillation: Secondary | ICD-10-CM | POA: Diagnosis not present

## 2022-01-31 DIAGNOSIS — I4891 Unspecified atrial fibrillation: Secondary | ICD-10-CM | POA: Diagnosis not present

## 2022-01-31 DIAGNOSIS — I10 Essential (primary) hypertension: Secondary | ICD-10-CM

## 2022-01-31 DIAGNOSIS — J449 Chronic obstructive pulmonary disease, unspecified: Secondary | ICD-10-CM | POA: Diagnosis not present

## 2022-01-31 DIAGNOSIS — E782 Mixed hyperlipidemia: Secondary | ICD-10-CM | POA: Insufficient documentation

## 2022-01-31 DIAGNOSIS — Z720 Tobacco use: Secondary | ICD-10-CM | POA: Diagnosis not present

## 2022-01-31 DIAGNOSIS — Z79899 Other long term (current) drug therapy: Secondary | ICD-10-CM | POA: Insufficient documentation

## 2022-01-31 DIAGNOSIS — I5032 Chronic diastolic (congestive) heart failure: Secondary | ICD-10-CM | POA: Diagnosis not present

## 2022-01-31 DIAGNOSIS — I130 Hypertensive heart and chronic kidney disease with heart failure and stage 1 through stage 4 chronic kidney disease, or unspecified chronic kidney disease: Secondary | ICD-10-CM | POA: Diagnosis present

## 2022-01-31 DIAGNOSIS — F32A Depression, unspecified: Secondary | ICD-10-CM | POA: Insufficient documentation

## 2022-01-31 DIAGNOSIS — N183 Chronic kidney disease, stage 3 unspecified: Secondary | ICD-10-CM | POA: Diagnosis not present

## 2022-01-31 DIAGNOSIS — Z716 Tobacco abuse counseling: Secondary | ICD-10-CM | POA: Insufficient documentation

## 2022-01-31 DIAGNOSIS — I890 Lymphedema, not elsewhere classified: Secondary | ICD-10-CM | POA: Diagnosis not present

## 2022-01-31 DIAGNOSIS — F17210 Nicotine dependence, cigarettes, uncomplicated: Secondary | ICD-10-CM | POA: Insufficient documentation

## 2022-01-31 MED ORDER — HYDRALAZINE HCL 25 MG PO TABS: 25.0000 mg | ORAL_TABLET | Freq: Three times a day (TID) | ORAL | 3 refills | Status: DC

## 2022-01-31 NOTE — Progress Notes (Signed)
Dawn Sherman - PHARMACIST COUNSELING NOTE  Guideline-Directed Medical Therapy/Evidence Based Medicine  ACE/ARB/ARNI: None Beta Blocker: None Aldosterone Antagonist: None Diuretic: Furosemide 40 mg every other day SGLT2i: Dapagliflozin 10 mg daily  Adherence Assessment  Do you ever forget to take your medication? [] Yes [x] No  Do you ever skip doses due to side effects? [] Yes [x] No  Do you have trouble affording your medicines? [] Yes [x] No  Are you ever unable to pick up your medication due to transportation difficulties? [] Yes [x] No  Do you ever stop taking your medications because you don't believe they are helping? [] Yes [x] No  Do you check your weight daily? [] Yes [x] No   Adherence strategy: associates with time of day  Barriers to obtaining medications: none  Vital signs: HR 90, BP 154/67, weight (pounds) 123 lb ECHO: Date 12/11/2021, EF 65-70%, notes mild LVH     Latest Ref Rng & Units 12/17/2021    5:00 AM 12/16/2021    8:01 PM 12/14/2021    2:17 AM  BMP  Glucose 70 - 99 mg/dL 64  98  101   BUN 8 - 23 mg/dL 40  50  55   Creatinine 0.44 - 1.00 mg/dL 1.97  2.21  1.87   Sodium 135 - 145 mmol/L 137  129  132   Potassium 3.5 - 5.1 mmol/L 3.8  3.7  3.7   Chloride 98 - 111 mmol/L 110  101  109   CO2 22 - 32 mmol/L 21  19  17    Calcium 8.9 - 10.3 mg/dL 7.8  8.2  7.5     Past Medical History:  Diagnosis Date   B12 deficiency    CHF (congestive heart failure) (HCC)    CKD (chronic kidney disease), stage III (HCC)    Depression    Endometriosis    Hypertension    Mixed hyperlipidemia    Tobacco abuse     ASSESSMENT 66 year old female who presents to the HF clinic for follow up of CHF. PMH relevant to CKD3, depression, HTN, HLD, tobacco use. Most recent GFR 28.83 (near 30). Blood pressure in clinic of 154/67 is above goal (130/80). Regimen includes Farxiga, hydralazine 10 mg TID, amlodipine 10 mg daily, and furosemide.    PLAN Increase hydralazine to 25 mg TID for further control of blood pressure. Patient was advised to take 2 tablets of 10 mg dose to finish currently supply of medication.  Reconciled medications   Time spent: 15 minutes  [name], PharmD Pharmacy Resident  01/31/2022 10:35 AM  Current Outpatient Medications:    acetaminophen (TYLENOL) 325 MG tablet, Take 650 mg by mouth every 6 (six) hours as needed., Disp: , Rfl:    amLODipine (NORVASC) 10 MG tablet, Take 1 tablet (10 mg total) by mouth daily. (Patient taking differently: Take 5 mg by mouth daily.), Disp: 30 tablet, Rfl: 1   atorvastatin (LIPITOR) 40 MG tablet, TAKE 1 TABLET BY MOUTH EVERY DAY, Disp: 90 tablet, Rfl: 1   buPROPion (WELLBUTRIN XL) 150 MG 24 hr tablet, TAKE ONE TABLET BY MOUTH DAILY, Disp: 90 tablet, Rfl: 0   calcium carbonate (OS-CAL - DOSED IN MG OF ELEMENTAL CALCIUM) 1250 (500 Ca) MG tablet, Take 1 tablet (500 mg of elemental calcium total) by mouth 3 (three) times daily with meals., Disp: 60 tablet, Rfl: 1   FARXIGA 10 MG TABS tablet, Take 10 mg by mouth daily., Disp: , Rfl:    ferrous sulfate 325 (65 FE)  MG tablet, Take 1 tablet (325 mg total) by mouth 2 (two) times daily with a meal., Disp: 60 tablet, Rfl: 3   Fluticasone-Umeclidin-Vilant (TRELEGY ELLIPTA) 100-62.5-25 MCG/ACT AEPB, Inhale 1 puff into the lungs daily., Disp: 1 each, Rfl: 1   furosemide (LASIX) 40 MG tablet, TAKE ONE TABLET BY MOUTH EVERY OTHER DAY, Disp: 15 tablet, Rfl: 1   hydrALAZINE (APRESOLINE) 25 MG tablet, Take 1 tablet (25 mg total) by mouth 3 (three) times daily., Disp: 270 tablet, Rfl: 3   loratadine (CLARITIN) 10 MG tablet, Take 10 mg by mouth daily., Disp: , Rfl:    omeprazole (PRILOSEC) 20 MG capsule, Take 20 mg by mouth daily., Disp: , Rfl:    PHOS-NAK 280-160-250 MG PACK, , Disp: , Rfl:    sodium bicarbonate 650 MG tablet, Take 1 tablet (650 mg total) by mouth 2 (two) times daily., Disp: 60 tablet, Rfl: 0   venlafaxine XR  (EFFEXOR-XR) 150 MG 24 hr capsule, TAKE 1 CAPSULE BY MOUTH EVERY DAY, Disp: 90 capsule, Rfl: 1   COUNSELING POINTS/CLINICAL PEARLS   DRUGS TO CAUTION IN HEART FAILURE  Drug or Class Mechanism  Analgesics NSAIDs COX-2 inhibitors Glucocorticoids  Sodium and water retention, increased systemic vascular resistance, decreased response to diuretics   Diabetes Medications Metformin Thiazolidinediones Rosiglitazone (Avandia) Pioglitazone (Actos) DPP4 Inhibitors Saxagliptin (Onglyza) Sitagliptin (Januvia)   Lactic acidosis Possible calcium channel blockade   Unknown  Antiarrhythmics Class I  Flecainide Disopyramide Class III Sotalol Other Dronedarone  Negative inotrope, proarrhythmic   Proarrhythmic, beta blockade  Negative inotrope  Antihypertensives Alpha Blockers Doxazosin Calcium Channel Blockers Diltiazem Verapamil Nifedipine Central Alpha Adrenergics Moxonidine Peripheral Vasodilators Minoxidil  Increases renin and aldosterone  Negative inotrope    Possible sympathetic withdrawal  Unknown  Anti-infective Itraconazole Amphotericin B  Negative inotrope Unknown  Hematologic Anagrelide Cilostazol   Possible inhibition of PD IV Inhibition of PD III causing arrhythmias  Neurologic/Psychiatric Stimulants Anti-Seizure Drugs Carbamazepine Pregabalin Antidepressants Tricyclics Citalopram Parkinsons Bromocriptine Pergolide Pramipexole Antipsychotics Clozapine Antimigraine Ergotamine Methysergide Appetite suppressants Bipolar Lithium  Peripheral alpha and beta agonist activity  Negative inotrope and chronotrope Calcium channel blockade  Negative inotrope, proarrhythmic Dose-dependent QT prolongation  Excessive serotonin activity/valvular damage Excessive serotonin activity/valvular damage Unknown  IgE mediated hypersensitivy, calcium channel blockade  Excessive serotonin activity/valvular damage Excessive serotonin  activity/valvular damage Valvular damage  Direct myofibrillar degeneration, adrenergic stimulation  Antimalarials Chloroquine Hydroxychloroquine Intracellular inhibition of lysosomal enzymes  Urologic Agents Alpha Blockers Doxazosin Prazosin Tamsulosin Terazosin  Increased renin and aldosterone  Adapted from Page Carleene Overlie, et al. "Drugs That May Cause or Exacerbate Heart Failure: A Scientific Statement from the American Heart  Association." Circulation 2016; 134:e32-e69. DOI: 10.1161/CIR.0000000000000426   MEDICATION ADHERENCES TIPS AND STRATEGIES Taking medication as prescribed improves patient outcomes in heart failure (reduces hospitalizations, improves symptoms, increases survival) Side effects of medications can be managed by decreasing doses, switching agents, stopping drugs, or adding additional therapy. Please let someone in the [facility] know if you have having bothersome side effects so we can modify your regimen. Do not alter your medication regimen without talking to [place].  Medication reminders can help patients remember to take drugs on time. If you are missing or forgetting doses you can try linking behaviors, using pill boxes, or an electronic reminder like an alarm on your phone or an app. Some people can also get automated phone calls as medication reminders.

## 2022-01-31 NOTE — Patient Instructions (Addendum)
Continue weighing daily and call for an overnight weight gain of 3 pounds or more or a weekly weight gain of more than 5 pounds.   If you receive a satisfaction survey regarding the Heart Failure Clinic, please take the time to fill it out. This way we can continue to provide excellent care and make any changes that need to be made.    Finish your current hyrdalazine by taking 2 tablets three times a day. When you pick up your new prescription, you will return to taking 1 tablet three times a day as this will be the higher dose (25mg )

## 2022-01-31 NOTE — Progress Notes (Signed)
Patient ID: Dawn Sherman, female    DOB: July 10, 1956, 66 y.o.   MRN: [id number]   Dawn Sherman is a 66 y/o female with a history of HTN, CKD, hyperlipidemia, depression, current tobacco use and chronic heart failure.   Echo report from 12/11/21 reviewed and showed an EF of 65-70% along with mild LVH/ LAE and mild MR. Echo report from 08/20/21 reviewed and showed EF of 50 to 55%. The left ventricle has low normal function. The left ventricle has no regional wall motion abnormalities. The left ventricular internal cavity size was mildly dilated. Echo report from 02/10/20 reviewed and showed an EF of 30-35% along with mildly elevated PA pressure, moderate MR and mild/moderate TR.   Was in the ED 12/16/21 due to weakness. Head CT unremarkable. Slight dehydration corrected and she was released. Admitted 12/10/21 due to a fall in the setting of bradycardia and developed subarachnoid hemorrhage. Cardiology, neurosurgery & hematology consults obtained. Head CT obtained. NOC stopped. Platelet transfusions needed for new ITP. Given decadron and 2 doses of IV IG. Hypokalemia corrected. Coreg held due to bradycardia. Discharged after 4 days. Admitted 08/19/21 due to COPD exacerbation. She was discharged home after 7 days with oxygen, however has not needed to use it since discharge. Her SPO2 is normally 95%. At discharge, her lasix was resumed.   She presents today with a chief complaint of a follow-up visit. She currently has no complaints and specifically denies any difficulty sleeping, dizziness, abdominal distention, palpitations, pedal edema, chest pain, wheezing, shortness of breath, cough, fatigue or weight gain.   Says that she has her physical early July with labs the week prior to that.   Edema has resolved since decreasing her amlodipine to 5mg  daily.   Past Medical History:  Diagnosis Date   B12 deficiency    CHF (congestive heart failure) ([place])    CKD (chronic kidney disease), stage III ([place])     Depression    Endometriosis    Hypertension    Mixed hyperlipidemia    Tobacco abuse    Past Surgical History:  Procedure Laterality Date   ABDOMINAL HYSTERECTOMY     BREAST BIOPSY Right 2015   neg-  FIBROADENOMA   TAH/RSO  1999   secondary to bleeding and endometriosis (Dr Vernie Ammons)   Family History  Problem Relation Age of Onset   Hypercholesterolemia Mother    Rheum arthritis Father    Rheum arthritis Daughter    Fibromyalgia Daughter    Breast cancer Neg Hx    Colon cancer Neg Hx    Social History   Tobacco Use   Smoking status: Every Day    Packs/day: 0.25    Years: 40.00    Total pack years: 10.00    Types: Cigarettes   Smokeless tobacco: Never  Substance Use Topics   Alcohol use: Not Currently    Alcohol/week: 0.0 standard drinks of alcohol    Comment: occasional   Allergies  Allergen Reactions   Sulfate Rash   Codeine Sulfate Nausea Only   Benicar [Olmesartan]     Talked with patient February 10, 2020, intolerance is unclear, tried several medications around that time and one of them gave her a rash but she is not clear which 1.   Amoxicillin Rash   Clindamycin/Lincomycin Rash   Morphine And Related Rash   Penicillins Rash   Prior to Admission medications   Medication Sig Start Date End Date Taking? Authorizing Provider  acetaminophen (TYLENOL) 325 MG tablet Take 650  mg by mouth every 6 (six) hours as needed.   Yes [provider]  amLODipine (NORVASC) 10 MG tablet Take 1 tablet (10 mg total) by mouth daily. Patient taking differently: Take 5 mg by mouth daily. 12/14/21  Yes Fritzi Mandes, MD  atorvastatin (LIPITOR) 40 MG tablet TAKE 1 TABLET BY MOUTH EVERY DAY 08/10/21  Yes [name], MD  buPROPion (WELLBUTRIN XL) 150 MG 24 hr tablet TAKE ONE TABLET BY MOUTH DAILY 01/09/22  Yes [name], MD  calcium carbonate (OS-CAL - DOSED IN MG OF ELEMENTAL CALCIUM) 1250 (500 Ca) MG tablet Take 1 tablet (500 mg of elemental calcium total) by mouth 3  (three) times daily with meals. 12/14/21  Yes Fritzi Mandes, MD  FARXIGA 10 MG TABS tablet Take 10 mg by mouth daily. 11/20/21  Yes [provider]  ferrous sulfate 325 (65 FE) MG tablet Take 1 tablet (325 mg total) by mouth 2 (two) times daily with a meal. 12/14/21  Yes Fritzi Mandes, MD  Fluticasone-Umeclidin-Vilant (TRELEGY ELLIPTA) 100-62.5-25 MCG/ACT AEPB Inhale 1 puff into the lungs daily. 08/26/21  Yes Little Ishikawa, MD  furosemide (LASIX) 40 MG tablet TAKE ONE TABLET BY MOUTH EVERY OTHER DAY 01/22/22  Yes [name], MD  hydrALAZINE (APRESOLINE) 10 MG tablet Take 1 tablet (10 mg total) by mouth 3 (three) times daily. 10/25/21  Yes [name], MD  loratadine (CLARITIN) 10 MG tablet Take 10 mg by mouth daily.   Yes [provider]  omeprazole (PRILOSEC) 20 MG capsule Take 20 mg by mouth daily.   Yes [provider]  PHOS-NAK [phone] [place] PACK  12/17/21  Yes [provider]  sodium bicarbonate 650 MG tablet Take 1 tablet (650 mg total) by mouth 2 (two) times daily. 12/14/21  Yes Fritzi Mandes, MD  venlafaxine XR (EFFEXOR-XR) 150 MG 24 hr capsule TAKE 1 CAPSULE BY MOUTH EVERY DAY 11/27/21  Yes [name], MD    Review of Systems  Constitutional:  Negative for appetite change and fatigue.  HENT:  Negative for congestion, postnasal drip and sore throat.   Eyes: Negative.   Respiratory:  Negative for cough, shortness of breath and wheezing.   Cardiovascular:  Negative for chest pain, palpitations and leg swelling.  Gastrointestinal:  Negative for abdominal distention and abdominal pain.  Endocrine: Negative.   Genitourinary: Negative.   Musculoskeletal:  Negative for back pain and neck pain.  Skin: Negative.   Allergic/Immunologic: Negative.   Neurological:  Negative for dizziness and light-headedness.  Hematological:  Negative for adenopathy. Does not bruise/bleed easily.  Psychiatric/Behavioral:  Negative for dysphoric mood and sleep disturbance  (sleeps on 1 pillow). The patient is not nervous/anxious.     Vitals:   01/31/22 0905  BP: (!) 154/67  Pulse: 90  Resp: 16  SpO2: 99%  Weight: 123 lb 4 oz (55.9 kg)  Height: 5\' 5"  (1.651 m)   Wt Readings from Last 3 Encounters:  01/31/22 123 lb 4 oz (55.9 kg)  01/01/22 123 lb 12.8 oz (56.2 kg)  12/31/21 126 lb 6 oz (57.3 kg)   Lab Results  Component Value Date   CREATININE 1.97 (H) 12/17/2021   CREATININE 2.21 (H) 12/16/2021   CREATININE 1.87 (H) 12/14/2021   Physical Exam Vitals and nursing note reviewed.  Constitutional:      General: She is not in acute distress.    Appearance: Normal appearance. She is not ill-appearing.  HENT:     Head: Normocephalic and atraumatic.  Cardiovascular:  Rate and Rhythm: Normal rate and regular rhythm.     Heart sounds: No murmur heard. Pulmonary:     Effort: Pulmonary effort is normal. No respiratory distress.     Breath sounds: No wheezing or rales.  Abdominal:     General: There is no distension.     Palpations: Abdomen is soft.     Tenderness: There is no abdominal tenderness.  Musculoskeletal:        General: No tenderness.     Cervical back: Normal range of motion and neck supple.     Right lower leg: No edema.     Left lower leg: No edema.  Skin:    General: Skin is warm and dry.  Neurological:     General: No focal deficit present.     Mental Status: She is alert and oriented to person, place, and time.  Psychiatric:        Mood and Affect: Mood normal.        Behavior: Behavior normal.        Thought Content: Thought content normal.    Assessment & Plan:  1: Chronic heart failure with preserved ejection fraction with structural changes- - NYHA class I - euvolemic today - weighing daily; reminded to call for an overnight weight gain of >2 pounds or a weekly weight gain of >5 pounds - weight down 3 pounds from last visit here 1 month ago - not adding salt to her food - she's unsure of her fluid intake but  feels like it's probably "too much"; reviewed the importance of keeping her daily fluid intake to ~ 40 ounces daily due to her renal function - on coreg, losartan (entresto had caused hyperkalemia) - could consider adding jardiance watching renal function carefully - BNP 12/10/21 was 41.5  2: HTN- - BP mildly elevated with reduction of amlodipine at last visit; will increase her hydralazine to 25mg  TID - she was instructed to finish her current bottle by taking 2 tablets (20mg  total) TID until gone and then begin the 25mg  dose as 1 tablet TID - saw PCP [name]) 11/23/21 - BMP 12/17/21 reviewed and showed sodium 137, potassium 3.8, creatinine 1.97 and GFR 28 - saw nephrology [name]) 11/20/21  3: Afib - SR today - saw cardiology Rockey Situ) 02/07/21  4: Tobacco use- - smoking 3 cigarettes/day - trying to completely quit - complete cessation discussed for 3 minutes with her  5: Lymphedema- - saw vascular Owens Shark) 01/01/22 - resolved   Medication list reviewed.   Return in 6 months, sooner if needed.

## 2022-01-31 NOTE — Telephone Encounter (Signed)
I called the pt with the appt for neurology 6/27 at 1:30, she was agreeable to the appt.

## 2022-01-31 NOTE — Telephone Encounter (Signed)
Called pt and let her know that I called over to the neurology  dept. And wanted to know the update for appt for the pt. We sent in the ref. 5/5 and the the staff member asked how we sent it and it was by fax and we have the transmission that it went through. She said they did not have it but she looked in care everywhere and they can see the ref in computer for 5/5. She apologized and made an appt for the patient 6/27 at 1:30 at [facility] and go to the neurology dept. Patient is agreeable to the appt

## 2022-02-04 ENCOUNTER — Telehealth: Payer: Self-pay | Admitting: Pharmacy Technician

## 2022-02-04 DIAGNOSIS — Z596 Low income: Secondary | ICD-10-CM

## 2022-02-04 NOTE — Progress Notes (Signed)
[facility])                                            [place] Team    02/04/2022  Dawn Sherman 10/16/1955 [id number]                                      Medication Assistance Referral  Referral From: [place]  Medication/Company: [employer] / [employer] Patient application portion:  Mailed Provider application portion: Faxed  to Dr. [name] Provider address/fax verified via: Office website  Medication/Company: [employer] / [employer] Patient application portion:  Mailed Provider application portion: Faxed  to Dr. Arneta Cliche Provider address/fax verified via: Office website   Dawn Sherman P. Dawn Sherman, [place]  [phone]

## 2022-02-06 ENCOUNTER — Other Ambulatory Visit: Payer: Self-pay

## 2022-02-06 MED ORDER — TRELEGY ELLIPTA 100-62.5-25 MCG/ACT IN AEPB: 1.0000 | INHALATION_SPRAY | Freq: Every day | RESPIRATORY_TRACT | 1 refills | Status: DC

## 2022-02-13 ENCOUNTER — Telehealth: Payer: Self-pay

## 2022-02-13 NOTE — Telephone Encounter (Signed)
The patient stated she does need samples . She will be by to pick them up.

## 2022-02-13 NOTE — Telephone Encounter (Signed)
LM for pt re : got trelegy samples. Does she still need ?

## 2022-02-19 ENCOUNTER — Telehealth: Payer: Self-pay | Admitting: Pharmacy Technician

## 2022-02-19 DIAGNOSIS — Z596 Low income: Secondary | ICD-10-CM

## 2022-02-22 ENCOUNTER — Other Ambulatory Visit: Payer: Self-pay | Admitting: Physician Assistant

## 2022-02-22 DIAGNOSIS — I609 Nontraumatic subarachnoid hemorrhage, unspecified: Secondary | ICD-10-CM

## 2022-02-26 ENCOUNTER — Other Ambulatory Visit: Payer: Self-pay | Admitting: Internal Medicine

## 2022-02-28 ENCOUNTER — Telehealth: Payer: Self-pay | Admitting: Pharmacy Technician

## 2022-02-28 ENCOUNTER — Other Ambulatory Visit (INDEPENDENT_AMBULATORY_CARE_PROVIDER_SITE_OTHER): Payer: Medicare Other

## 2022-02-28 DIAGNOSIS — R739 Hyperglycemia, unspecified: Secondary | ICD-10-CM | POA: Diagnosis not present

## 2022-02-28 DIAGNOSIS — I10 Essential (primary) hypertension: Secondary | ICD-10-CM

## 2022-02-28 DIAGNOSIS — E7800 Pure hypercholesterolemia, unspecified: Secondary | ICD-10-CM

## 2022-02-28 DIAGNOSIS — Z596 Low income: Secondary | ICD-10-CM

## 2022-02-28 LAB — LIPID PANEL
Cholesterol: 191 mg/dL (ref 0–200)
HDL: 105.9 mg/dL (ref >39.00)
LDL Cholesterol: 73 mg/dL (ref 0–99)
NonHDL: 85.33
Total CHOL/HDL Ratio: 2
Triglycerides: 62 mg/dL (ref 0.0–149.0)
VLDL: 12.4 mg/dL (ref 0.0–40.0)

## 2022-02-28 LAB — HEPATIC FUNCTION PANEL
ALT: 16 U/L (ref 0–35)
AST: 20 U/L (ref 0–37)
Albumin: 4.3 g/dL (ref 3.5–5.2)
Alkaline Phosphatase: 92 U/L (ref 39–117)
Bilirubin, Direct: 0.1 mg/dL (ref 0.0–0.3)
Total Bilirubin: 0.3 mg/dL (ref 0.2–1.2)
Total Protein: 6.9 g/dL (ref 6.0–8.3)

## 2022-02-28 LAB — BASIC METABOLIC PANEL
BUN: 24 mg/dL — ABNORMAL HIGH (ref 6–23)
CO2: 21 mEq/L (ref 19–32)
Calcium: 9.2 mg/dL (ref 8.4–10.5)
Chloride: 105 mEq/L (ref 96–112)
Creatinine, Ser: 2.01 mg/dL — ABNORMAL HIGH (ref 0.40–1.20)
GFR: 25.36 mL/min — ABNORMAL LOW (ref >60.00)
Glucose, Bld: 101 mg/dL — ABNORMAL HIGH (ref 70–99)
Potassium: 4.9 mEq/L (ref 3.5–5.1)
Sodium: 135 mEq/L (ref 135–145)

## 2022-02-28 LAB — HEMOGLOBIN A1C: Hgb A1c MFr Bld: 6 % (ref 4.6–6.5)

## 2022-02-28 LAB — TSH: TSH: 2.66 u[IU]/mL (ref 0.35–5.50)

## 2022-02-28 NOTE — Progress Notes (Signed)
[facility])                                            [place] Team    02/28/2022  KEYLEEN CERRATO 10-Aug-1956 [id number]  Two care coordination calls placed to 2 different patient assistance companies today in regard to [facility] application with [employer] and [employer] application with [place].  First call made to [employer]. Spoke to [place] who informed patient was APPROVED 02/20/22-08/25/22 for [place]. He informs medication delivery for [employer] is in progress to the patient's home address. The USPS tracking number is [id number]. He informed to allow 5-10 business days for delivery to her home address.  Second call made to [place]. Spoke to Codie who informed patient was APPROVED 02/21/22-08/25/22. She informed a  60 days supply order with no refills remaining (that is what script was written for) was shipped on 02/22/2022. She informed, however, that when carrier attempted to deliver the package to the patient on 02/27/22, the package was refused and is being returned to [place]. If patient wishes to have the delivery sent back out then the provider will need to write a new prescription. Codie advises the provider to write for 90 days supply with refills as they typically dispense in 90 days supply increments.  Attempted to outreach patient but unfortunately she did not answer the phone. HIPAA ocmpliant voicemail left requesting a return call.   Makayla Lanter P. Isley Weisheit, [place]  [phone]

## 2022-03-05 ENCOUNTER — Ambulatory Visit (INDEPENDENT_AMBULATORY_CARE_PROVIDER_SITE_OTHER): Payer: Medicare Other | Admitting: Internal Medicine

## 2022-03-05 ENCOUNTER — Encounter: Payer: Self-pay | Admitting: Internal Medicine

## 2022-03-05 ENCOUNTER — Other Ambulatory Visit: Payer: Self-pay

## 2022-03-05 DIAGNOSIS — F325 Major depressive disorder, single episode, in full remission: Secondary | ICD-10-CM

## 2022-03-05 DIAGNOSIS — I609 Nontraumatic subarachnoid hemorrhage, unspecified: Secondary | ICD-10-CM

## 2022-03-05 DIAGNOSIS — I480 Paroxysmal atrial fibrillation: Secondary | ICD-10-CM | POA: Diagnosis not present

## 2022-03-05 DIAGNOSIS — E785 Hyperlipidemia, unspecified: Secondary | ICD-10-CM

## 2022-03-05 DIAGNOSIS — J432 Centrilobular emphysema: Secondary | ICD-10-CM | POA: Diagnosis not present

## 2022-03-05 DIAGNOSIS — I10 Essential (primary) hypertension: Secondary | ICD-10-CM

## 2022-03-05 DIAGNOSIS — N1832 Chronic kidney disease, stage 3b: Secondary | ICD-10-CM

## 2022-03-05 DIAGNOSIS — F1011 Alcohol abuse, in remission: Secondary | ICD-10-CM

## 2022-03-05 DIAGNOSIS — E7800 Pure hypercholesterolemia, unspecified: Secondary | ICD-10-CM

## 2022-03-05 DIAGNOSIS — K219 Gastro-esophageal reflux disease without esophagitis: Secondary | ICD-10-CM

## 2022-03-05 DIAGNOSIS — I5032 Chronic diastolic (congestive) heart failure: Secondary | ICD-10-CM

## 2022-03-05 MED ORDER — TRELEGY ELLIPTA 100-62.5-25 MCG/ACT IN AEPB: 1.0000 | INHALATION_SPRAY | Freq: Every day | RESPIRATORY_TRACT | 3 refills | Status: DC

## 2022-03-05 MED ORDER — TRELEGY ELLIPTA 100-62.5-25 MCG/ACT IN AEPB: 1.0000 | INHALATION_SPRAY | Freq: Every day | RESPIRATORY_TRACT | 1 refills | Status: DC

## 2022-03-05 NOTE — Progress Notes (Signed)
Patient ID: Dawn Sherman, female   DOB: 19-Aug-1956, 66 y.o.   MRN: [id number]   Subjective:    Patient ID: Dawn Sherman, female    DOB: 04-Apr-1956, 66 y.o.   MRN: [id number]   HPI With past history of hypertension, hypercholesterolemia and afib.  Comes in today to follow up on these issues as well as for a complete physical exam. Reports she is doing relatively well.  Does report increased stress.  Sister-n-law - advanced cancer.  Daughter - diagnosed with lupus.  Has good support.  Overall she feels she is handling things relatively well.  Does report some fatigue.  She is weighing herself regularly.  Weight at home 123 pounds and remaining stable.  Taking lasix qod.  No chest pain.  Some chronic sob with exertion.  Feels is stable.  No increased cough or congestion.  No acid reflux.  No abdominal pain.  Bowels moving.  Saw neurology 02/19/22 -  f/u fall and head injury - f/u head CT 12/16/21 - unchanged small volume subarachnoid hemorrhage in left sylvain fissure.  F/u brain MRI ordreed.  Scheduled for Friday.    Past Medical History:  Diagnosis Date   B12 deficiency    CHF (congestive heart failure) (HCC)    CKD (chronic kidney disease), stage III (HCC)    Depression    Endometriosis    Hypertension    Mixed hyperlipidemia    Tobacco abuse    Past Surgical History:  Procedure Laterality Date   ABDOMINAL HYSTERECTOMY     BREAST BIOPSY Right 2015   neg-  FIBROADENOMA   TAH/RSO  1999   secondary to bleeding and endometriosis (Dr Vernie Ammons)   Family History  Problem Relation Age of Onset   Hypercholesterolemia Mother    Rheum arthritis Father    Rheum arthritis Daughter    Fibromyalgia Daughter    Breast cancer Neg Hx    Colon cancer Neg Hx    Social History   Socioeconomic History   Marital status: Married    Spouse name: Not on file   Number of children: Not on file   Years of education: Not on file   Highest education level: Not on file  Occupational History   Not on  file  Tobacco Use   Smoking status: Every Day    Packs/day: 0.25    Years: 40.00    Total pack years: 10.00    Types: Cigarettes   Smokeless tobacco: Never  Vaping Use   Vaping Use: Never used  Substance and Sexual Activity   Alcohol use: Not Currently    Alcohol/week: 0.0 standard drinks of alcohol    Comment: occasional   Drug use: No   Sexual activity: Not Currently  Other Topics Concern   Not on file  Social History Narrative   Lives in [place] with her husband.  She is retired from [employer].  She does not routinely exercise.   Social Determinants of Health   Financial Resource Strain: Low Risk  (11/27/2021)   Overall Financial Resource Strain (CARDIA)    Difficulty of Paying Living Expenses: Not hard at all  Food Insecurity: No Food Insecurity (11/27/2021)   Hunger Vital Sign    Worried About Running Out of Food in the Last Year: Never true    Ran Out of Food in the Last Year: Never true  Transportation Needs: No Transportation Needs (11/27/2021)   PRAPARE - Hydrologist (Medical): No  Lack of Transportation (Non-Medical): No  Physical Activity: Sufficiently Active (11/27/2021)   Exercise Vital Sign    Days of Exercise per Week: 7 days    Minutes of Exercise per Session: 30 min  Stress: No Stress Concern Present (11/27/2021)   [place]    Feeling of Stress : Not at all  Social Connections: Unknown (11/27/2021)   Social Connection and Isolation Panel [NHANES]    Frequency of Communication with Friends and Family: Not on file    Frequency of Social Gatherings with Friends and Family: Not on file    Attends Religious Services: Not on file    Active Member of Clubs or Organizations: Not on file    Attends Archivist Meetings: Not on file    Marital Status: Married     Review of Systems  Constitutional:  Negative for appetite change and unexpected weight  change.  HENT:  Negative for congestion and sinus pressure.   Respiratory:  Negative for cough and chest tightness.        She feels her breathing is stable.   Cardiovascular:  Negative for chest pain, palpitations and leg swelling.  Gastrointestinal:  Negative for abdominal pain, diarrhea, nausea and vomiting.  Genitourinary:  Negative for difficulty urinating and dysuria.  Musculoskeletal:  Negative for joint swelling and myalgias.  Skin:  Negative for color change and rash.  Neurological:  Negative for dizziness, light-headedness and headaches.  Psychiatric/Behavioral:  Negative for agitation and dysphoric mood.        Increased stress as outlined.        Objective:     BP 138/72   Pulse 67   Temp 98.4 F (36.9 C) (Temporal)   Resp 17   Ht 5\' 5"  (1.651 m)   Wt 125 lb 12.8 oz (57.1 kg)   SpO2 96%   BMI 20.93 kg/m  Wt Readings from Last 3 Encounters:  03/05/22 125 lb 12.8 oz (57.1 kg)  01/31/22 123 lb 4 oz (55.9 kg)  01/01/22 123 lb 12.8 oz (56.2 kg)    Physical Exam Vitals reviewed.  Constitutional:      General: She is not in acute distress.    Appearance: Normal appearance.  HENT:     Head: Normocephalic and atraumatic.     Right Ear: External ear normal.     Left Ear: External ear normal.  Eyes:     General: No scleral icterus.       Right eye: No discharge.        Left eye: No discharge.     Conjunctiva/sclera: Conjunctivae normal.  Neck:     Thyroid: No thyromegaly.  Cardiovascular:     Rate and Rhythm: Normal rate and regular rhythm.  Pulmonary:     Effort: No respiratory distress.     Breath sounds: Normal breath sounds. No wheezing.  Abdominal:     General: Bowel sounds are normal.     Palpations: Abdomen is soft.     Tenderness: There is no abdominal tenderness.  Musculoskeletal:        General: No swelling or tenderness.     Cervical back: Neck supple. No tenderness.  Lymphadenopathy:     Cervical: No cervical adenopathy.  Skin:     Findings: No erythema or rash.  Neurological:     Mental Status: She is alert.  Psychiatric:        Mood and Affect: Mood normal.  Behavior: Behavior normal.      Outpatient Encounter Medications as of 03/05/2022  Medication Sig   acetaminophen (TYLENOL) 325 MG tablet Take 650 mg by mouth every 6 (six) hours as needed.   amLODipine (NORVASC) 10 MG tablet Take 1 tablet (10 mg total) by mouth daily. (Patient taking differently: Take 5 mg by mouth daily.)   atorvastatin (LIPITOR) 40 MG tablet TAKE ONE TABLET BY MOUTH DAILY   buPROPion (WELLBUTRIN XL) 150 MG 24 hr tablet TAKE ONE TABLET BY MOUTH DAILY   calcium carbonate (OS-CAL - DOSED IN MG OF ELEMENTAL CALCIUM) 1250 (500 Ca) MG tablet Take 1 tablet (500 mg of elemental calcium total) by mouth 3 (three) times daily with meals.   FARXIGA 10 MG TABS tablet Take 10 mg by mouth daily.   ferrous sulfate 325 (65 FE) MG tablet Take 1 tablet (325 mg total) by mouth 2 (two) times daily with a meal.   furosemide (LASIX) 40 MG tablet TAKE ONE TABLET BY MOUTH EVERY OTHER DAY   hydrALAZINE (APRESOLINE) 25 MG tablet Take 1 tablet (25 mg total) by mouth 3 (three) times daily.   loratadine (CLARITIN) 10 MG tablet Take 10 mg by mouth daily.   omeprazole (PRILOSEC) 20 MG capsule Take 20 mg by mouth daily.   PHOS-NAK 280-160-250 MG PACK    sodium bicarbonate 650 MG tablet Take 1 tablet (650 mg total) by mouth 2 (two) times daily.   venlafaxine XR (EFFEXOR-XR) 150 MG 24 hr capsule TAKE 1 CAPSULE BY MOUTH EVERY DAY   [DISCONTINUED] Fluticasone-Umeclidin-Vilant (TRELEGY ELLIPTA) 100-62.5-25 MCG/ACT AEPB Inhale 1 puff into the lungs daily.   Fluticasone-Umeclidin-Vilant (TRELEGY ELLIPTA) 100-62.5-25 MCG/ACT AEPB Inhale 1 puff into the lungs daily.   No facility-administered encounter medications on file as of 03/05/2022.     Lab Results  Component Value Date   WBC 7.7 12/28/2021   HGB 10.3 (L) 12/28/2021   HCT 32.4 (L) 12/28/2021   PLT 242 12/28/2021    GLUCOSE 101 (H) 02/28/2022   CHOL 191 02/28/2022   TRIG 62.0 02/28/2022   HDL 105.90 02/28/2022   LDLDIRECT 53.3 10/14/2012   LDLCALC 73 02/28/2022   ALT 16 02/28/2022   AST 20 02/28/2022   NA 135 02/28/2022   K 4.9 02/28/2022   CL 105 02/28/2022   CREATININE 2.01 (H) 02/28/2022   BUN 24 (H) 02/28/2022   CO2 21 02/28/2022   TSH 2.66 02/28/2022   INR 0.9 12/10/2021   HGBA1C 6.0 02/28/2022    CT Head Wo Contrast  Result Date: 12/16/2021 CLINICAL DATA:  Subarachnoid hemorrhage follow up EXAM: CT HEAD WITHOUT CONTRAST TECHNIQUE: Contiguous axial images were obtained from the base of the skull through the vertex without intravenous contrast. RADIATION DOSE REDUCTION: This exam was performed according to the departmental dose-optimization program which includes automated exposure control, adjustment of the mA and/or kV according to patient size and/or use of iterative reconstruction technique. COMPARISON:  None. FINDINGS: Brain: Small volume subarachnoid hemorrhage in left sylvian fissure is unchanged. No new site of hemorrhage. No midline shift or other mass effect. Chronic small vessel ischemia. Vascular: No abnormal hyperdensity of the major intracranial arteries or dural venous sinuses. No intracranial atherosclerosis. Skull: The visualized skull base, calvarium and extracranial soft tissues are normal. Sinuses/Orbits: No fluid levels or advanced mucosal thickening of the visualized paranasal sinuses. No mastoid or middle ear effusion. The orbits are normal. IMPRESSION: Unchanged small volume subarachnoid hemorrhage in left Sylvian fissure. Electronically Signed   By: Lennette Bihari  [place] M.D.   On: 12/16/2021 21:38       Assessment & Plan:   Problem List Items Addressed This Visit     Chronic diastolic CHF (congestive heart failure) ([place])    Continue farxiga.        Chronic obstructive pulmonary disease (HCC)    Breathing stable.       Relevant Medications    Fluticasone-Umeclidin-Vilant (TRELEGY ELLIPTA) 100-62.5-25 MCG/ACT AEPB   CKD (chronic kidney disease) stage 3, GFR 30-59 ml/min (HCC)    Continue to avoid antiinflammatories.  Being followed by nephrology.  Discharged on lasix.  Losartan stopped secondary to hyperkalemia.  Follow metabolic panel.  On farxiga now.        Depression, major, single episode, complete remission ([place])    Continue effexor and wellbutrin. Increased stress recently as outlined.  Has good support.  Follow.       Dyslipidemia    Continue statin therapy.       Essential hypertension    Continue amlodipine and hydralazine.  Follow pressures.  Follow metabolic panel.       GERD (gastroesophageal reflux disease)    No upper symptoms reported.  On prilosec.       History of alcohol abuse    Has quit drinking.  Continues to abstain from drinking       Hypercholesterolemia    Continue lipitor.  Low cholesterol diet and exercise.  Follow lipid panel and liver function tests.        Paroxysmal A-fib (HCC)    Documented paroxysmal afib.  Continue coreg.  No increased heart rate or palpitations. Appears to be in SR.  Follow.       Subarachnoid hemorrhage ([place])    Recent fall and CT as outlined.  Seeing neurology. Planning f/u MRI this week.         [name], MD

## 2022-03-07 ENCOUNTER — Telehealth: Payer: Self-pay

## 2022-03-07 NOTE — Telephone Encounter (Signed)
New rx for Trelegy faxed to [facility]

## 2022-03-08 ENCOUNTER — Ambulatory Visit
Admission: RE | Admit: 2022-03-08 | Discharge: 2022-03-08 | Disposition: A | Discharge status: Home / Self Care | Payer: Medicare Other | Source: Ambulatory Visit | Attending: Physician Assistant | Admitting: Physician Assistant

## 2022-03-08 ENCOUNTER — Telehealth: Payer: Self-pay | Admitting: Pharmacy Technician

## 2022-03-08 ENCOUNTER — Telehealth: Payer: Self-pay | Admitting: Internal Medicine

## 2022-03-08 DIAGNOSIS — Z596 Low income: Secondary | ICD-10-CM

## 2022-03-08 DIAGNOSIS — I609 Nontraumatic subarachnoid hemorrhage, unspecified: Secondary | ICD-10-CM | POA: Diagnosis present

## 2022-03-08 NOTE — Progress Notes (Addendum)
[facility])                                            [place] Team    05/03/2022  JONALYN SEDLAK Mar 13, 1956 [id number]  Care coordination call placed to [employer] to check on Farxiga shipment. Spoke to [place] who informs medication was delivered 05/01/22 at 10:29am in or at [facility]. Unsuccessful outreach call placed to patient in regard to voicemail she left informing she had not received the medication. HIPAA compliant voicemail left relaying above information. Also provided patient phone number to call [employer] if she needs to dispute receipt of medication.  Christee Mervine P. Arville [place]le[place], [place]  [phone]                                           [facility])                                            [place] Team    03/08/2022  YENTY BLOCH [birth year] [id number]  Incoming call received from patient in regard to medication assistance for Trelegy with [place] and Farixga with [employer].   Patient was returning the call this afternoon. She was informed of her approvals with both [place] for Trelegy and [employer] for [place]. She apologized for the mix up with the medication deliveries. She was provided the phone number to [employer] ([phone]) to call and have them re issue the [place].  Informed patient that we were faxing [place] the updated Trelegy script to get them to re issue that medication. She was provided my direct line as well if she has any problems requesting the reissue of  the [place] and/or if she does not receive both meds by end of the month.   Jaqua Ching P. Javonte Elenes, [place]  [phone]

## 2022-03-08 NOTE — Telephone Encounter (Signed)
-----   Message from Jason Fila, CPhT sent at 03/08/2022  1:40 PM EDT ----- Regarding: patient returned call Hi! Patient returned the call this afternoon. She was provided the phone number to [employer] ([phone]) to call and have them re issue the [place]. She was provided my direct line as well if she has any issues requesting the reissue of  the [place] and/or if she does not receive both meds by end of the month. Thanks again for your time, Sharee Pimple ----- Message ----- From: Jason Fila, CPhT Sent: 03/08/2022  10:11 AM EDT To: [name], MD; Elayne Guerin, [facility] Subject: [place] patient assistance with [employer]          Hi!  Just a quick update. Our mutual patient was approved for [place]. I have tried to outreach her a few times but have had to leave voicemail messages and the calls have not been returned.  Patient refused the [employer]PS shipment of Farxiga. Patient will need to call [employer] at [phone]  if she wants to receive the medication for free from [employer] patient assistance program.  We are currently in the process of getting her Trelegy with [place] re shipped to her as she refused that shipment as well.  Thanks for your time today,  Luiz Ochoa. [place], [place]  [phone]

## 2022-03-11 ENCOUNTER — Encounter: Payer: Self-pay | Admitting: Internal Medicine

## 2022-03-11 NOTE — Assessment & Plan Note (Signed)
No upper symptoms reported. On prilosec.  

## 2022-03-11 NOTE — Assessment & Plan Note (Signed)
Continue effexor and wellbutrin. Increased stress recently as outlined.  Has good support.  Follow.

## 2022-03-11 NOTE — Assessment & Plan Note (Signed)
Recent fall and CT as outlined.  Seeing neurology. Planning f/u MRI this week.

## 2022-03-11 NOTE — Assessment & Plan Note (Signed)
Continue statin therapy.

## 2022-03-11 NOTE — Assessment & Plan Note (Signed)
Documented paroxysmal afib.  Continue coreg.  No increased heart rate or palpitations. Appears to be in SR.  Follow.

## 2022-03-11 NOTE — Assessment & Plan Note (Signed)
Continue farxiga.

## 2022-03-11 NOTE — Assessment & Plan Note (Signed)
Breathing stable.

## 2022-03-11 NOTE — Assessment & Plan Note (Signed)
Continue to avoid antiinflammatories.  Being followed by nephrology.  Discharged on lasix.  Losartan stopped secondary to hyperkalemia.  Follow metabolic panel.  On farxiga now.

## 2022-03-11 NOTE — Assessment & Plan Note (Signed)
Continue lipitor.  Low cholesterol diet and exercise.  Follow lipid panel and liver function tests.   

## 2022-03-11 NOTE — Assessment & Plan Note (Signed)
Has quit drinking.  Continues to abstain from drinking

## 2022-03-11 NOTE — Assessment & Plan Note (Signed)
Continue amlodipine and hydralazine.  Follow pressures.  Follow metabolic panel.

## 2022-03-14 ENCOUNTER — Emergency Department: Payer: Medicare Other

## 2022-03-14 ENCOUNTER — Other Ambulatory Visit: Payer: Self-pay

## 2022-03-14 ENCOUNTER — Encounter: Payer: Self-pay | Admitting: Emergency Medicine

## 2022-03-14 ENCOUNTER — Emergency Department
Admission: EM | Admit: 2022-03-14 | Discharge: 2022-03-14 | Disposition: A | Discharge status: Home / Self Care | Payer: Medicare Other | Attending: Emergency Medicine | Admitting: Emergency Medicine

## 2022-03-14 DIAGNOSIS — I509 Heart failure, unspecified: Secondary | ICD-10-CM | POA: Insufficient documentation

## 2022-03-14 DIAGNOSIS — Z20822 Contact with and (suspected) exposure to covid-19: Secondary | ICD-10-CM | POA: Insufficient documentation

## 2022-03-14 DIAGNOSIS — N189 Chronic kidney disease, unspecified: Secondary | ICD-10-CM | POA: Diagnosis not present

## 2022-03-14 DIAGNOSIS — E875 Hyperkalemia: Secondary | ICD-10-CM | POA: Diagnosis not present

## 2022-03-14 DIAGNOSIS — I440 Atrioventricular block, first degree: Secondary | ICD-10-CM | POA: Diagnosis not present

## 2022-03-14 DIAGNOSIS — J449 Chronic obstructive pulmonary disease, unspecified: Secondary | ICD-10-CM | POA: Diagnosis not present

## 2022-03-14 DIAGNOSIS — I130 Hypertensive heart and chronic kidney disease with heart failure and stage 1 through stage 4 chronic kidney disease, or unspecified chronic kidney disease: Secondary | ICD-10-CM | POA: Diagnosis not present

## 2022-03-14 DIAGNOSIS — Z720 Tobacco use: Secondary | ICD-10-CM

## 2022-03-14 DIAGNOSIS — F17200 Nicotine dependence, unspecified, uncomplicated: Secondary | ICD-10-CM | POA: Diagnosis not present

## 2022-03-14 DIAGNOSIS — D649 Anemia, unspecified: Secondary | ICD-10-CM | POA: Diagnosis not present

## 2022-03-14 DIAGNOSIS — R0602 Shortness of breath: Secondary | ICD-10-CM | POA: Diagnosis present

## 2022-03-14 DIAGNOSIS — I10 Essential (primary) hypertension: Secondary | ICD-10-CM

## 2022-03-14 DIAGNOSIS — R944 Abnormal results of kidney function studies: Secondary | ICD-10-CM | POA: Insufficient documentation

## 2022-03-14 LAB — COMPREHENSIVE METABOLIC PANEL
ALT: 18 U/L (ref 0–44)
AST: 20 U/L (ref 15–41)
Albumin: 4.2 g/dL (ref 3.5–5.0)
Alkaline Phosphatase: 108 U/L (ref 38–126)
Anion gap: 8 (ref 5–15)
BUN: 31 mg/dL — ABNORMAL HIGH (ref 8–23)
CO2: 22 mmol/L (ref 22–32)
Calcium: 8.7 mg/dL — ABNORMAL LOW (ref 8.9–10.3)
Chloride: 104 mmol/L (ref 98–111)
Creatinine, Ser: 2.12 mg/dL — ABNORMAL HIGH (ref 0.44–1.00)
GFR, Estimated: 25 mL/min — ABNORMAL LOW (ref >60)
Glucose, Bld: 96 mg/dL (ref 70–99)
Potassium: 5.5 mmol/L — ABNORMAL HIGH (ref 3.5–5.1)
Sodium: 134 mmol/L — ABNORMAL LOW (ref 135–145)
Total Bilirubin: 0.5 mg/dL (ref 0.3–1.2)
Total Protein: 7.4 g/dL (ref 6.5–8.1)

## 2022-03-14 LAB — CBC WITH DIFFERENTIAL/PLATELET
Abs Immature Granulocytes: 0.02 10*3/uL (ref 0.00–0.07)
Basophils Absolute: 0.1 10*3/uL (ref 0.0–0.1)
Basophils Relative: 1 %
Eosinophils Absolute: 0.2 10*3/uL (ref 0.0–0.5)
Eosinophils Relative: 2 %
HCT: 30.7 % — ABNORMAL LOW (ref 36.0–46.0)
Hemoglobin: 9.2 g/dL — ABNORMAL LOW (ref 12.0–15.0)
Immature Granulocytes: 0 %
Lymphocytes Relative: 13 %
Lymphs Abs: 1.2 10*3/uL (ref 0.7–4.0)
MCH: 24.5 pg — ABNORMAL LOW (ref 26.0–34.0)
MCHC: 30 g/dL (ref 30.0–36.0)
MCV: 81.6 fL (ref 80.0–100.0)
Monocytes Absolute: 1 10*3/uL (ref 0.1–1.0)
Monocytes Relative: 11 %
Neutro Abs: 6.5 10*3/uL (ref 1.7–7.7)
Neutrophils Relative %: 73 %
Platelets: 284 10*3/uL (ref 150–400)
RBC: 3.76 MIL/uL — ABNORMAL LOW (ref 3.87–5.11)
RDW: 17.2 % — ABNORMAL HIGH (ref 11.5–15.5)
WBC: 8.8 10*3/uL (ref 4.0–10.5)
nRBC: 0 % (ref 0.0–0.2)

## 2022-03-14 LAB — PROCALCITONIN: Procalcitonin: <0.1 ng/mL

## 2022-03-14 LAB — TROPONIN I (HIGH SENSITIVITY)
Troponin I (High Sensitivity): 21 ng/L — ABNORMAL HIGH (ref <18)
Troponin I (High Sensitivity): 23 ng/L — ABNORMAL HIGH (ref <18)

## 2022-03-14 LAB — SARS CORONAVIRUS 2 BY RT PCR: SARS Coronavirus 2 by RT PCR: NEGATIVE

## 2022-03-14 LAB — BRAIN NATRIURETIC PEPTIDE: B Natriuretic Peptide: 538.3 pg/mL — ABNORMAL HIGH (ref 0.0–100.0)

## 2022-03-14 MED ORDER — IPRATROPIUM-ALBUTEROL 0.5-2.5 (3) MG/3ML IN SOLN
3.0000 mL | Freq: Once | RESPIRATORY_TRACT | Status: AC
  Administered 2022-03-14: 3 mL via RESPIRATORY_TRACT
  Filled 2022-03-14: qty 3

## 2022-03-14 MED ORDER — FUROSEMIDE 10 MG/ML IJ SOLN
40.0000 mg | Freq: Once | INTRAMUSCULAR | Status: AC
  Administered 2022-03-14: 40 mg via INTRAVENOUS
  Filled 2022-03-14: qty 4

## 2022-03-14 MED ORDER — ALBUTEROL SULFATE HFA 108 (90 BASE) MCG/ACT IN AERS: 2.0000 | INHALATION_SPRAY | Freq: Four times a day (QID) | RESPIRATORY_TRACT | 2 refills | Status: DC | PRN

## 2022-03-14 NOTE — ED Provider Notes (Addendum)
[facility] Provider Note    Event Date/Time   First MD Initiated Contact with Patient 03/14/22 1954     (approximate)   History   Shortness of Breath   HPI  Dawn Sherman is a 66 y.o. female with past medical history of COPD, CHF, CKD, HTN, depression and tobacco abuse who presents for evaluation of 3 days of worsening cough and shortness of breath.  He states she is not coughing up anything.  No fevers, chest pain, abdominal pain, back pain, headache, earache, sore throat, vomiting, diarrhea, urinary symptoms or syncope.  She denies illicit drug use or EtOH use.      Physical Exam  Triage Vital Signs: ED Triage Vitals  Enc Vitals Group     BP 03/14/22 1503 (!) 154/71     Pulse Rate 03/14/22 1503 89     Resp 03/14/22 1503 16     Temp 03/14/22 1503 98.5 F (36.9 C)     Temp Source 03/14/22 1503 Oral     SpO2 03/14/22 1503 99 %     Weight 03/14/22 1506 123 lb (55.8 kg)     Height 03/14/22 1506 5\' 5"  (1.651 m)     Head Circumference --      Peak Flow --      Pain Score 03/14/22 1506 0     Pain Loc --      Pain Edu? --      Excl. in [place]? --     Most recent vital signs: Vitals:   03/14/22 2001 03/14/22 2030  BP:  (!) 151/68  Pulse:  85  Resp:  20  Temp: 98.7 F (37.1 C)   SpO2:  96%    General: Awake, no distress.  CV:  Good peripheral perfusion.  2+ radial pulse.  No significant murmur. Resp:  Normal effort.  Clear bilaterally without significant wheezing rhonchi or rales. Abd:  No distention.  Soft. Other:  Some minimal symmetric lower extremity.   ED Results / Procedures / Treatments  Labs (all labs ordered are listed, but only abnormal results are displayed) Labs Reviewed  CBC WITH DIFFERENTIAL/PLATELET - Abnormal; Notable for the following components:      Result Value   RBC 3.76 (*)    Hemoglobin 9.2 (*)    HCT 30.7 (*)    MCH 24.5 (*)    RDW 17.2 (*)    All other components within normal limits  COMPREHENSIVE METABOLIC  PANEL - Abnormal; Notable for the following components:   Sodium 134 (*)    Potassium 5.5 (*)    BUN 31 (*)    Creatinine, Ser 2.12 (*)    Calcium 8.7 (*)    GFR, Estimated 25 (*)    All other components within normal limits  BRAIN NATRIURETIC PEPTIDE - Abnormal; Notable for the following components:   B Natriuretic Peptide 538.3 (*)    All other components within normal limits  TROPONIN I (HIGH SENSITIVITY) - Abnormal; Notable for the following components:   Troponin I (High Sensitivity) 21 (*)    All other components within normal limits  TROPONIN I (HIGH SENSITIVITY) - Abnormal; Notable for the following components:   Troponin I (High Sensitivity) 23 (*)    All other components within normal limits  SARS CORONAVIRUS 2 BY RT PCR  PROCALCITONIN     EKG  ECG is remarkable for sinus rhythm with first-degree block with a PR interval of 280 with fairly diffuse nonspecific ST changes versus  artifact throughout.  Otherwise normal axis and intervals.   RADIOLOGY Chest x-ray my interpretation shows some appears to be some chronic scarring at the right base around both hilum compared to chest x-ray from April 17 without clear focal consolidation, effusion, edema, pneumothorax or other clear acute process.  I also reviewed radiology's interpretation.   PROCEDURES:  Critical Care performed: No  Procedures  The patient is on the cardiac monitor to evaluate for evidence of arrhythmia and/or significant heart rate changes.   MEDICATIONS ORDERED IN ED: Medications  furosemide (LASIX) injection 40 mg (40 mg Intravenous Given 03/14/22 2029)  ipratropium-albuterol (DUONEB) 0.5-2.5 (3) MG/3ML nebulizer solution 3 mL (3 mLs Nebulization Given 03/14/22 2020)     IMPRESSION / MDM / ASSESSMENT AND PLAN / ED COURSE  I reviewed the triage vital signs and the nursing notes. Patient's presentation is most consistent with acute presentation with potential threat to life or bodily function.                                Differential diagnosis includes, but is not limited to COPD exacerbation, pneumonia, CHF, pneumothorax, arrhythmia, anemia overall lower suspicion for PE or dissection at this time given overall description of symptoms without any evidence of tachycardia or hypotension rest or distress or hypoxia.  ECG is remarkable for sinus rhythm with first-degree block with a PR interval of 280 with fairly diffuse nonspecific ST changes versus artifact throughout.  Otherwise normal axis and intervals.  Troponins are stable at 21 and 23 I suspect likely representing some mild demand with a very low suspicion for occlusion MI at this time.  Chest x-ray my interpretation shows some appears to be some chronic scarring at the right base around both hilum compared to chest x-ray from April 17 without clear focal consolidation, effusion, edema, pneumothorax or other clear acute process.  I also reviewed radiology's interpretation.  CMP is remarkable for K of 5.5, creatinine of 2.12 compared to 2.012 weeks ago without any other significant acute electrolyte or metabolic derangements.  CBC without leukocytosis and hemoglobin of 9.2 compared to 10.3 2 months ago and normal platelets.  BNP is elevated at 538.  I suspect mild volume overload as primary etiology for patient's symptoms today.  We will give a dose of 40 mg of Lasix.  Patient does have COPD and ongoing tobacco use I think is reasonable to trial some DuoNebs to see if this helps with shortness of breath.  On reassessment she has urinated and is feeling much better.  We will advised her to give herself her Lasix dose tomorrow and have her follow-up in heart failure clinic.  Counseled on tobacco cessation advised her to follow-up her blood pressure with her PCP.  We will also add albuterol to her trilogy inhaler.  Discharged in stable condition.  Strict return precautions advised and discussed.  Patient will follow-up her COVID result on  MyChart.        FINAL CLINICAL IMPRESSION(S) / ED DIAGNOSES   Final diagnoses:  Acute on chronic congestive heart failure, unspecified heart failure type (HCC)  Tobacco abuse  Hypertension, unspecified type     Rx / DC Orders   ED Discharge Orders          Ordered    AMB referral to CHF clinic        03/14/22 2048    albuterol (VENTOLIN HFA) 108 (90 Base) MCG/ACT inhaler  Every  6 hours PRN        03/14/22 2059             Note:  This document was prepared using Dragon voice recognition software and may include unintentional dictation errors.   Lucrezia Starch, MD 03/14/22 2106    Lucrezia Starch, MD 03/14/22 2107

## 2022-03-14 NOTE — ED Notes (Signed)
Patient verbalizes understanding of discharge instructions. Opportunity for questioning and answers were provided. Armband removed by staff, pt discharged from ED to home via  pOV

## 2022-03-14 NOTE — ED Triage Notes (Signed)
Patient c/o shortness of breath over the past couple days. Denies any chest pain.

## 2022-03-14 NOTE — ED Provider Triage Note (Signed)
Emergency Medicine Provider Triage Evaluation Note  Dawn Sherman , a 66 y.o. female  was evaluated in triage.  Patient has a history of depression, hypertension, chronic kidney disease, CHF and COPD.  Patient states that she has been coughing for the past 3 days and has had associated shortness of breath without wheezing.  She denies weight gain. Review of Systems  Positive: Patient has shortness of breath and cough. Negative: No chest pain or abdominal pain.  Physical Exam  There were no vitals taken for this visit. Gen:   Awake, no distress   Resp:  Normal effort  MSK:   Moves extremities without difficulty  Other:    Medical Decision Making  Medically screening exam initiated at 2:59 PM.  Appropriate orders placed.  Dawn Sherman was informed that the remainder of the evaluation will be completed by another provider, this initial triage assessment does not replace that evaluation, and the importance of remaining in the ED until their evaluation is complete.     Dawn Sherman, [place] 03/14/22 1501

## 2022-03-15 ENCOUNTER — Telehealth: Payer: Self-pay | Admitting: Internal Medicine

## 2022-03-15 NOTE — Telephone Encounter (Signed)
Pt called in to get her result for MRI of her Brain... Pt stated that she had the MRI done a week ago and no one called her about the results... Pt requesting callback

## 2022-03-15 NOTE — Telephone Encounter (Signed)
Routed this note to Melrose Nakayama, [place] at [facility] Neurology to request that someone contacts patient today with results

## 2022-03-15 NOTE — Telephone Encounter (Signed)
MRI was not ordered by our office. Left patient a voicemail to contact neurology

## 2022-03-18 NOTE — Progress Notes (Unsigned)
Patient ID: Dawn Sherman, female    DOB: 1956/01/12, 67 y.o.   MRN: [id number]   Dawn Sherman is a 66 y/o female with a history of HTN, CKD, hyperlipidemia, depression, current tobacco use and chronic heart failure.   Echo report from 12/11/21 reviewed and showed an EF of 65-70% along with mild LVH/ LAE and mild MR. Echo report from 08/20/21 reviewed and showed EF of 50 to 55%. The left ventricle has low normal function. The left ventricle has no regional wall motion abnormalities. The left ventricular internal cavity size was mildly dilated. Echo report from 02/10/20 reviewed and showed an EF of 30-35% along with mildly elevated PA pressure, moderate MR and mild/moderate TR.   Was in the ED 03/14/22 due to SOB due to HF exacerbation. Given IV lasix and nebulizer with improvement of symptoms and she was released. Was in the ED 12/16/21 due to weakness. Head CT unremarkable. Slight dehydration corrected and she was released. Admitted 12/10/21 due to a fall in the setting of bradycardia and developed subarachnoid hemorrhage. Cardiology, neurosurgery & hematology consults obtained. Head CT obtained. NOC stopped. Platelet transfusions needed for new ITP. Given decadron and 2 doses of IV IG. Hypokalemia corrected. Coreg held due to bradycardia. Discharged after 4 days.   She presents today with a chief complaint of a follow-up visit. She currently has not complaints and specifically denies any difficulty sleeping, dizziness, abdominal distention, palpitations, pedal edema, chest pain, wheezing, shortness of breath, cough, fatigue or weight gain.   Says that her recent ED visit occurred suddenly but she's done well since then. She was given a nebulizer but no solution to put in it.   Past Medical History:  Diagnosis Date   B12 deficiency    CHF (congestive heart failure) ([place])    CKD (chronic kidney disease), stage III ([place])    Depression    Endometriosis    Hypertension    Mixed hyperlipidemia     Tobacco abuse    Past Surgical History:  Procedure Laterality Date   ABDOMINAL HYSTERECTOMY     BREAST BIOPSY Right 2015   neg-  FIBROADENOMA   TAH/RSO  1999   secondary to bleeding and endometriosis (Dr Vernie Ammons)   Family History  Problem Relation Age of Onset   Hypercholesterolemia Mother    Rheum arthritis Father    Rheum arthritis Daughter    Fibromyalgia Daughter    Breast cancer Neg Hx    Colon cancer Neg Hx    Social History   Tobacco Use   Smoking status: Every Day    Packs/day: 0.25    Years: 40.00    Total pack years: 10.00    Types: Cigarettes   Smokeless tobacco: Never  Substance Use Topics   Alcohol use: Not Currently    Alcohol/week: 0.0 standard drinks of alcohol    Comment: occasional   Allergies  Allergen Reactions   Sulfate Rash   Codeine Sulfate Nausea Only   Benicar [Olmesartan]     Talked with patient February 10, 2020, intolerance is unclear, tried several medications around that time and one of them gave her a rash but she is not clear which 1.   Amoxicillin Rash   Clindamycin/Lincomycin Rash   Morphine And Related Rash   Penicillins Rash   Prior to Admission medications   Medication Sig Start Date End Date Taking? Authorizing Provider  acetaminophen (TYLENOL) 325 MG tablet Take 650 mg by mouth every 6 (six) hours as needed.  Yes [provider]  albuterol (VENTOLIN HFA) 108 (90 Base) MCG/ACT inhaler Inhale 2 puffs into the lungs every 6 (six) hours as needed for wheezing or shortness of breath. 03/14/22  Yes Lucrezia Starch, MD  amLODipine (NORVASC) 10 MG tablet Take 1 tablet (10 mg total) by mouth daily. Patient taking differently: Take 5 mg by mouth daily. 12/14/21  Yes Fritzi Mandes, MD  atorvastatin (LIPITOR) 40 MG tablet TAKE ONE TABLET BY MOUTH DAILY 02/27/22  Yes [name], MD  buPROPion (WELLBUTRIN XL) 150 MG 24 hr tablet TAKE ONE TABLET BY MOUTH DAILY 01/09/22  Yes [name], MD  calcium carbonate (OS-CAL - DOSED IN MG  OF ELEMENTAL CALCIUM) 1250 (500 Ca) MG tablet Take 1 tablet (500 mg of elemental calcium total) by mouth 3 (three) times daily with meals. 12/14/21  Yes Fritzi Mandes, MD  FARXIGA 10 MG TABS tablet Take 10 mg by mouth daily. 11/20/21  Yes [provider]  ferrous sulfate 325 (65 FE) MG tablet Take 1 tablet (325 mg total) by mouth 2 (two) times daily with a meal. 12/14/21  Yes Fritzi Mandes, MD  Fluticasone-Umeclidin-Vilant (TRELEGY ELLIPTA) 100-62.5-25 MCG/ACT AEPB Inhale 1 puff into the lungs daily. 03/05/22  Yes [name], MD  furosemide (LASIX) 40 MG tablet TAKE ONE TABLET BY MOUTH EVERY OTHER DAY 01/22/22  Yes [name], MD  hydrALAZINE (APRESOLINE) 25 MG tablet Take 1 tablet (25 mg total) by mouth 3 (three) times daily. 01/31/22  Yes Moncerrath Berhe A, FNP  loratadine (CLARITIN) 10 MG tablet Take 10 mg by mouth daily.   Yes [provider]  omeprazole (PRILOSEC) 20 MG capsule Take 20 mg by mouth daily.   Yes [provider]  PHOS-NAK [phone] [place] PACK  12/17/21  Yes [provider]  sodium bicarbonate 650 MG tablet Take 1 tablet (650 mg total) by mouth 2 (two) times daily. 12/14/21  Yes Fritzi Mandes, MD  venlafaxine XR (EFFEXOR-XR) 150 MG 24 hr capsule TAKE 1 CAPSULE BY MOUTH EVERY DAY 11/27/21  Yes [name], MD   Review of Systems  Constitutional:  Negative for appetite change and fatigue.  HENT:  Negative for congestion, postnasal drip and sore throat.   Eyes: Negative.   Respiratory:  Negative for cough, shortness of breath and wheezing.   Cardiovascular:  Negative for chest pain, palpitations and leg swelling.  Gastrointestinal:  Negative for abdominal distention and abdominal pain.  Endocrine: Negative.   Genitourinary: Negative.   Musculoskeletal:  Negative for back pain and neck pain.  Skin: Negative.   Allergic/Immunologic: Negative.   Neurological:  Negative for dizziness and light-headedness.  Hematological:  Negative for adenopathy. Does  not bruise/bleed easily.  Psychiatric/Behavioral:  Negative for dysphoric mood and sleep disturbance (sleeps on 1 pillow). The patient is not nervous/anxious.    Vitals:   03/19/22 1249  BP: (!) 134/58  Pulse: 92  Resp: 16  SpO2: 97%  Weight: 121 lb 6 oz (55.1 kg)  Height: 5\' 5"  (1.651 m)   Wt Readings from Last 3 Encounters:  03/19/22 121 lb 6 oz (55.1 kg)  03/14/22 123 lb (55.8 kg)  03/05/22 125 lb 12.8 oz (57.1 kg)   Lab Results  Component Value Date   CREATININE 2.12 (H) 03/14/2022   CREATININE 2.01 (H) 02/28/2022   CREATININE 1.97 (H) 12/17/2021   Physical Exam Vitals and nursing note reviewed.  Constitutional:      General: She is not in acute distress.    Appearance: Normal appearance.  She is not ill-appearing.  HENT:     Head: Normocephalic and atraumatic.  Cardiovascular:     Rate and Rhythm: Normal rate and regular rhythm.     Heart sounds: No murmur heard. Pulmonary:     Effort: Pulmonary effort is normal. No respiratory distress.     Breath sounds: No wheezing or rales.  Abdominal:     General: There is no distension.     Palpations: Abdomen is soft.     Tenderness: There is no abdominal tenderness.  Musculoskeletal:        General: No tenderness.     Cervical back: Normal range of motion and neck supple.     Right lower leg: No edema.     Left lower leg: No edema.  Skin:    General: Skin is warm and dry.  Neurological:     General: No focal deficit present.     Mental Status: She is alert and oriented to person, place, and time.  Psychiatric:        Mood and Affect: Mood normal.        Behavior: Behavior normal.        Thought Content: Thought content normal.    Assessment & Plan:  1: Chronic heart failure with preserved ejection fraction with structural changes- - NYHA class I - euvolemic today - weighing daily; reminded to call for an overnight weight gain of >2 pounds or a weekly weight gain of >5 pounds - weight down 2 pounds from last  visit here 6 weeks ago - not adding salt to her food - on coreg, losartan (entresto had caused hyperkalemia) - could consider adding jardiance watching renal function carefully - BNP 03/14/22 was 538,3  2: HTN- - BP mildly elevated (134/58) - saw PCP [name]) 03/05/22 - BMP 03/14/22 reviewed and showed sodium 134, potassium 5.5, creatinine 2.12 and GFR 25 - saw nephrology [name]) 11/20/21; returns 03/25/22  3: Afib - SR today - saw cardiology Rockey Situ) 02/07/21  4: Tobacco use- - smoking 3 cigarettes/day - trying to completely quit - complete cessation discussed for 3 minutes with her   Medication list reviewed.   Return in 5 months, sooner if needed.

## 2022-03-19 ENCOUNTER — Encounter: Payer: Self-pay | Admitting: Family

## 2022-03-19 ENCOUNTER — Ambulatory Visit: Payer: Medicare Other | Attending: Family | Admitting: Family

## 2022-03-19 VITALS — BP 134/58 | HR 92 | Resp 16 | Ht 65.0 in | Wt 121.4 lb

## 2022-03-19 DIAGNOSIS — F32A Depression, unspecified: Secondary | ICD-10-CM | POA: Diagnosis not present

## 2022-03-19 DIAGNOSIS — Z720 Tobacco use: Secondary | ICD-10-CM | POA: Diagnosis not present

## 2022-03-19 DIAGNOSIS — F17210 Nicotine dependence, cigarettes, uncomplicated: Secondary | ICD-10-CM | POA: Insufficient documentation

## 2022-03-19 DIAGNOSIS — I4891 Unspecified atrial fibrillation: Secondary | ICD-10-CM | POA: Insufficient documentation

## 2022-03-19 DIAGNOSIS — N183 Chronic kidney disease, stage 3 unspecified: Secondary | ICD-10-CM | POA: Diagnosis not present

## 2022-03-19 DIAGNOSIS — E782 Mixed hyperlipidemia: Secondary | ICD-10-CM | POA: Insufficient documentation

## 2022-03-19 DIAGNOSIS — I5032 Chronic diastolic (congestive) heart failure: Secondary | ICD-10-CM | POA: Insufficient documentation

## 2022-03-19 DIAGNOSIS — I130 Hypertensive heart and chronic kidney disease with heart failure and stage 1 through stage 4 chronic kidney disease, or unspecified chronic kidney disease: Secondary | ICD-10-CM | POA: Insufficient documentation

## 2022-03-19 DIAGNOSIS — I480 Paroxysmal atrial fibrillation: Secondary | ICD-10-CM | POA: Diagnosis not present

## 2022-03-19 DIAGNOSIS — I10 Essential (primary) hypertension: Secondary | ICD-10-CM

## 2022-03-19 DIAGNOSIS — Z716 Tobacco abuse counseling: Secondary | ICD-10-CM | POA: Diagnosis not present

## 2022-03-19 NOTE — Patient Instructions (Signed)
Continue weighing daily and call for an overnight weight gain of 3 pounds or more or a weekly weight gain of more than 5 pounds.   If you have voicemail, please make sure your mailbox is cleaned out so that we may leave a message and please make sure to listen to any voicemails.     

## 2022-03-23 ENCOUNTER — Other Ambulatory Visit: Payer: Self-pay | Admitting: Internal Medicine

## 2022-03-25 ENCOUNTER — Inpatient Hospital Stay
Admission: EM | Admit: 2022-03-25 | Discharge: 2022-03-28 | DRG: 291 | Disposition: A | Discharge status: Home / Self Care | Payer: Medicare Other | Attending: Hospitalist | Admitting: Hospitalist

## 2022-03-25 ENCOUNTER — Other Ambulatory Visit: Payer: Self-pay

## 2022-03-25 ENCOUNTER — Emergency Department: Payer: Medicare Other

## 2022-03-25 DIAGNOSIS — I700 Atherosclerosis of aorta: Secondary | ICD-10-CM | POA: Diagnosis present

## 2022-03-25 DIAGNOSIS — F32A Depression, unspecified: Secondary | ICD-10-CM | POA: Diagnosis present

## 2022-03-25 DIAGNOSIS — K219 Gastro-esophageal reflux disease without esophagitis: Secondary | ICD-10-CM | POA: Diagnosis present

## 2022-03-25 DIAGNOSIS — Z885 Allergy status to narcotic agent status: Secondary | ICD-10-CM

## 2022-03-25 DIAGNOSIS — E785 Hyperlipidemia, unspecified: Secondary | ICD-10-CM | POA: Diagnosis present

## 2022-03-25 DIAGNOSIS — I130 Hypertensive heart and chronic kidney disease with heart failure and stage 1 through stage 4 chronic kidney disease, or unspecified chronic kidney disease: Secondary | ICD-10-CM | POA: Diagnosis not present

## 2022-03-25 DIAGNOSIS — E782 Mixed hyperlipidemia: Secondary | ICD-10-CM | POA: Diagnosis present

## 2022-03-25 DIAGNOSIS — I5033 Acute on chronic diastolic (congestive) heart failure: Secondary | ICD-10-CM | POA: Diagnosis present

## 2022-03-25 DIAGNOSIS — I609 Nontraumatic subarachnoid hemorrhage, unspecified: Secondary | ICD-10-CM

## 2022-03-25 DIAGNOSIS — I509 Heart failure, unspecified: Secondary | ICD-10-CM | POA: Diagnosis not present

## 2022-03-25 DIAGNOSIS — Z7901 Long term (current) use of anticoagulants: Secondary | ICD-10-CM

## 2022-03-25 DIAGNOSIS — Z880 Allergy status to penicillin: Secondary | ICD-10-CM

## 2022-03-25 DIAGNOSIS — I5A Non-ischemic myocardial injury (non-traumatic): Secondary | ICD-10-CM | POA: Diagnosis present

## 2022-03-25 DIAGNOSIS — E538 Deficiency of other specified B group vitamins: Secondary | ICD-10-CM | POA: Diagnosis present

## 2022-03-25 DIAGNOSIS — I5032 Chronic diastolic (congestive) heart failure: Secondary | ICD-10-CM | POA: Diagnosis present

## 2022-03-25 DIAGNOSIS — Z79899 Other long term (current) drug therapy: Secondary | ICD-10-CM

## 2022-03-25 DIAGNOSIS — D509 Iron deficiency anemia, unspecified: Secondary | ICD-10-CM | POA: Diagnosis present

## 2022-03-25 DIAGNOSIS — Z8679 Personal history of other diseases of the circulatory system: Secondary | ICD-10-CM

## 2022-03-25 DIAGNOSIS — Z888 Allergy status to other drugs, medicaments and biological substances status: Secondary | ICD-10-CM

## 2022-03-25 DIAGNOSIS — Z8261 Family history of arthritis: Secondary | ICD-10-CM

## 2022-03-25 DIAGNOSIS — N184 Chronic kidney disease, stage 4 (severe): Secondary | ICD-10-CM | POA: Diagnosis present

## 2022-03-25 DIAGNOSIS — N179 Acute kidney failure, unspecified: Secondary | ICD-10-CM | POA: Diagnosis present

## 2022-03-25 DIAGNOSIS — Z882 Allergy status to sulfonamides status: Secondary | ICD-10-CM

## 2022-03-25 DIAGNOSIS — I248 Other forms of acute ischemic heart disease: Secondary | ICD-10-CM | POA: Diagnosis present

## 2022-03-25 DIAGNOSIS — I480 Paroxysmal atrial fibrillation: Secondary | ICD-10-CM | POA: Diagnosis present

## 2022-03-25 DIAGNOSIS — Z720 Tobacco use: Secondary | ICD-10-CM | POA: Diagnosis present

## 2022-03-25 DIAGNOSIS — I10 Essential (primary) hypertension: Secondary | ICD-10-CM | POA: Diagnosis present

## 2022-03-25 DIAGNOSIS — J9601 Acute respiratory failure with hypoxia: Secondary | ICD-10-CM

## 2022-03-25 DIAGNOSIS — F17210 Nicotine dependence, cigarettes, uncomplicated: Secondary | ICD-10-CM | POA: Diagnosis present

## 2022-03-25 DIAGNOSIS — J449 Chronic obstructive pulmonary disease, unspecified: Secondary | ICD-10-CM | POA: Diagnosis present

## 2022-03-25 DIAGNOSIS — Z83438 Family history of other disorder of lipoprotein metabolism and other lipidemia: Secondary | ICD-10-CM

## 2022-03-25 DIAGNOSIS — I441 Atrioventricular block, second degree: Secondary | ICD-10-CM | POA: Diagnosis present

## 2022-03-25 DIAGNOSIS — I5020 Unspecified systolic (congestive) heart failure: Secondary | ICD-10-CM | POA: Diagnosis present

## 2022-03-25 DIAGNOSIS — F1011 Alcohol abuse, in remission: Secondary | ICD-10-CM | POA: Diagnosis present

## 2022-03-25 LAB — CBC
HCT: 34.6 % — ABNORMAL LOW (ref 36.0–46.0)
Hemoglobin: 10.2 g/dL — ABNORMAL LOW (ref 12.0–15.0)
MCH: 25.1 pg — ABNORMAL LOW (ref 26.0–34.0)
MCHC: 29.5 g/dL — ABNORMAL LOW (ref 30.0–36.0)
MCV: 85.2 fL (ref 80.0–100.0)
Platelets: 323 10*3/uL (ref 150–400)
RBC: 4.06 MIL/uL (ref 3.87–5.11)
RDW: 21.3 % — ABNORMAL HIGH (ref 11.5–15.5)
WBC: 9.9 10*3/uL (ref 4.0–10.5)
nRBC: 0 % (ref 0.0–0.2)

## 2022-03-25 LAB — COMPREHENSIVE METABOLIC PANEL
ALT: 16 U/L (ref 0–44)
AST: 19 U/L (ref 15–41)
Albumin: 3.9 g/dL (ref 3.5–5.0)
Alkaline Phosphatase: 113 U/L (ref 38–126)
Anion gap: 10 (ref 5–15)
BUN: 29 mg/dL — ABNORMAL HIGH (ref 8–23)
CO2: 20 mmol/L — ABNORMAL LOW (ref 22–32)
Calcium: 8.8 mg/dL — ABNORMAL LOW (ref 8.9–10.3)
Chloride: 110 mmol/L (ref 98–111)
Creatinine, Ser: 2.37 mg/dL — ABNORMAL HIGH (ref 0.44–1.00)
GFR, Estimated: 22 mL/min — ABNORMAL LOW (ref >60)
Glucose, Bld: 92 mg/dL (ref 70–99)
Potassium: 4.8 mmol/L (ref 3.5–5.1)
Sodium: 140 mmol/L (ref 135–145)
Total Bilirubin: 0.7 mg/dL (ref 0.3–1.2)
Total Protein: 6.8 g/dL (ref 6.5–8.1)

## 2022-03-25 LAB — URINALYSIS, ROUTINE W REFLEX MICROSCOPIC
Bacteria, UA: NONE SEEN
Bilirubin Urine: NEGATIVE
Glucose, UA: NEGATIVE mg/dL
Hgb urine dipstick: NEGATIVE
Ketones, ur: NEGATIVE mg/dL
Leukocytes,Ua: NEGATIVE
Nitrite: NEGATIVE
Protein, ur: >=300 mg/dL — AB
Specific Gravity, Urine: 1.016 (ref 1.005–1.030)
pH: 5 (ref 5.0–8.0)

## 2022-03-25 LAB — BRAIN NATRIURETIC PEPTIDE: B Natriuretic Peptide: 1712.2 pg/mL — ABNORMAL HIGH (ref 0.0–100.0)

## 2022-03-25 LAB — LIPASE, BLOOD: Lipase: 21 U/L (ref 11–51)

## 2022-03-25 LAB — TROPONIN I (HIGH SENSITIVITY): Troponin I (High Sensitivity): 37 ng/L — ABNORMAL HIGH (ref <18)

## 2022-03-25 NOTE — ED Notes (Addendum)
Pt to ED for N&V, and dizziness that started yesterday, pt states that she had estimated 5-6 episodes of vomiting. Denies Diarrhea. Pt denies syncopal episodes.   Pt has hx of CHF, denies GI hx.   Pt is A&Ox4, ambulatory at this time

## 2022-03-25 NOTE — ED Triage Notes (Signed)
See first nurse note, pt to ED from home, [facility]. Pt complains of nausea last night then vomiting every other hour last night. Vomited 1-2 times this morning, not now. Received zofran with EMS. 18g IV in place L AC.  Pt also complains of increased exertional dyspnea while climbing steps since several days. 94% on RA.  Hx CKD, COPD, CHF. Takes diuretics every other day.

## 2022-03-25 NOTE — ED Triage Notes (Signed)
Patient arrived by [facility] from home with c/o emesis, nausea, and dizziness. Reports exertional sob.   HX CHF  EMS vitals: 86% RA and placed on 2L 95% 130/82 b/p 52 HR 16RR   Given 4mg  zofran by EMS

## 2022-03-26 DIAGNOSIS — I441 Atrioventricular block, second degree: Secondary | ICD-10-CM

## 2022-03-26 DIAGNOSIS — J432 Centrilobular emphysema: Secondary | ICD-10-CM | POA: Diagnosis not present

## 2022-03-26 DIAGNOSIS — D509 Iron deficiency anemia, unspecified: Secondary | ICD-10-CM | POA: Diagnosis present

## 2022-03-26 DIAGNOSIS — N179 Acute kidney failure, unspecified: Secondary | ICD-10-CM | POA: Diagnosis present

## 2022-03-26 DIAGNOSIS — J9601 Acute respiratory failure with hypoxia: Secondary | ICD-10-CM | POA: Diagnosis not present

## 2022-03-26 DIAGNOSIS — F17210 Nicotine dependence, cigarettes, uncomplicated: Secondary | ICD-10-CM | POA: Diagnosis present

## 2022-03-26 DIAGNOSIS — I480 Paroxysmal atrial fibrillation: Secondary | ICD-10-CM

## 2022-03-26 DIAGNOSIS — N184 Chronic kidney disease, stage 4 (severe): Secondary | ICD-10-CM | POA: Diagnosis present

## 2022-03-26 DIAGNOSIS — I700 Atherosclerosis of aorta: Secondary | ICD-10-CM | POA: Diagnosis present

## 2022-03-26 DIAGNOSIS — Z79899 Other long term (current) drug therapy: Secondary | ICD-10-CM | POA: Diagnosis not present

## 2022-03-26 DIAGNOSIS — Z720 Tobacco use: Secondary | ICD-10-CM

## 2022-03-26 DIAGNOSIS — I609 Nontraumatic subarachnoid hemorrhage, unspecified: Secondary | ICD-10-CM

## 2022-03-26 DIAGNOSIS — J449 Chronic obstructive pulmonary disease, unspecified: Secondary | ICD-10-CM | POA: Diagnosis present

## 2022-03-26 DIAGNOSIS — Z880 Allergy status to penicillin: Secondary | ICD-10-CM | POA: Diagnosis not present

## 2022-03-26 DIAGNOSIS — I509 Heart failure, unspecified: Secondary | ICD-10-CM | POA: Diagnosis present

## 2022-03-26 DIAGNOSIS — F1011 Alcohol abuse, in remission: Secondary | ICD-10-CM | POA: Diagnosis present

## 2022-03-26 DIAGNOSIS — Z8679 Personal history of other diseases of the circulatory system: Secondary | ICD-10-CM | POA: Diagnosis not present

## 2022-03-26 DIAGNOSIS — Z885 Allergy status to narcotic agent status: Secondary | ICD-10-CM | POA: Diagnosis not present

## 2022-03-26 DIAGNOSIS — Z888 Allergy status to other drugs, medicaments and biological substances status: Secondary | ICD-10-CM | POA: Diagnosis not present

## 2022-03-26 DIAGNOSIS — I5033 Acute on chronic diastolic (congestive) heart failure: Secondary | ICD-10-CM | POA: Diagnosis present

## 2022-03-26 DIAGNOSIS — E785 Hyperlipidemia, unspecified: Secondary | ICD-10-CM | POA: Diagnosis not present

## 2022-03-26 DIAGNOSIS — Z8261 Family history of arthritis: Secondary | ICD-10-CM | POA: Diagnosis not present

## 2022-03-26 DIAGNOSIS — N189 Chronic kidney disease, unspecified: Secondary | ICD-10-CM | POA: Diagnosis not present

## 2022-03-26 DIAGNOSIS — I248 Other forms of acute ischemic heart disease: Secondary | ICD-10-CM | POA: Diagnosis present

## 2022-03-26 DIAGNOSIS — I5020 Unspecified systolic (congestive) heart failure: Secondary | ICD-10-CM | POA: Diagnosis present

## 2022-03-26 DIAGNOSIS — I5A Non-ischemic myocardial injury (non-traumatic): Secondary | ICD-10-CM

## 2022-03-26 DIAGNOSIS — K219 Gastro-esophageal reflux disease without esophagitis: Secondary | ICD-10-CM | POA: Diagnosis present

## 2022-03-26 DIAGNOSIS — Z7901 Long term (current) use of anticoagulants: Secondary | ICD-10-CM | POA: Diagnosis not present

## 2022-03-26 DIAGNOSIS — I130 Hypertensive heart and chronic kidney disease with heart failure and stage 1 through stage 4 chronic kidney disease, or unspecified chronic kidney disease: Secondary | ICD-10-CM | POA: Diagnosis present

## 2022-03-26 DIAGNOSIS — Z83438 Family history of other disorder of lipoprotein metabolism and other lipidemia: Secondary | ICD-10-CM | POA: Diagnosis not present

## 2022-03-26 DIAGNOSIS — I10 Essential (primary) hypertension: Secondary | ICD-10-CM

## 2022-03-26 DIAGNOSIS — E538 Deficiency of other specified B group vitamins: Secondary | ICD-10-CM | POA: Diagnosis present

## 2022-03-26 DIAGNOSIS — F32A Depression, unspecified: Secondary | ICD-10-CM | POA: Diagnosis present

## 2022-03-26 DIAGNOSIS — I5032 Chronic diastolic (congestive) heart failure: Secondary | ICD-10-CM | POA: Diagnosis present

## 2022-03-26 DIAGNOSIS — E782 Mixed hyperlipidemia: Secondary | ICD-10-CM | POA: Diagnosis present

## 2022-03-26 DIAGNOSIS — Z882 Allergy status to sulfonamides status: Secondary | ICD-10-CM | POA: Diagnosis not present

## 2022-03-26 LAB — URINE DRUG SCREEN, QUALITATIVE (ARMC ONLY)
Amphetamines, Ur Screen: NOT DETECTED
Barbiturates, Ur Screen: NOT DETECTED
Benzodiazepine, Ur Scrn: NOT DETECTED
Cannabinoid 50 Ng, Ur ~~LOC~~: NOT DETECTED
Cocaine Metabolite,Ur ~~LOC~~: NOT DETECTED
MDMA (Ecstasy)Ur Screen: NOT DETECTED
Methadone Scn, Ur: NOT DETECTED
Opiate, Ur Screen: NOT DETECTED
Phencyclidine (PCP) Ur S: NOT DETECTED
Tricyclic, Ur Screen: NOT DETECTED

## 2022-03-26 LAB — TROPONIN I (HIGH SENSITIVITY): Troponin I (High Sensitivity): 27 ng/L — ABNORMAL HIGH (ref <18)

## 2022-03-26 LAB — MAGNESIUM: Magnesium: 2.2 mg/dL (ref 1.7–2.4)

## 2022-03-26 MED ORDER — LORATADINE 10 MG PO TABS
10.0000 mg | ORAL_TABLET | Freq: Every day | ORAL | Status: DC
  Administered 2022-03-26 – 2022-03-28 (×3): 10 mg via ORAL
  Filled 2022-03-26 (×3): qty 1

## 2022-03-26 MED ORDER — FLUTICASONE FUROATE-VILANTEROL 100-25 MCG/ACT IN AEPB
1.0000 | INHALATION_SPRAY | Freq: Every day | RESPIRATORY_TRACT | Status: DC
  Administered 2022-03-28: 1 via RESPIRATORY_TRACT
  Filled 2022-03-26: qty 28

## 2022-03-26 MED ORDER — FERROUS SULFATE 325 (65 FE) MG PO TABS
325.0000 mg | ORAL_TABLET | Freq: Two times a day (BID) | ORAL | Status: DC
  Administered 2022-03-26 – 2022-03-28 (×5): 325 mg via ORAL
  Filled 2022-03-26 (×5): qty 1

## 2022-03-26 MED ORDER — MAGNESIUM HYDROXIDE 400 MG/5ML PO SUSP
30.0000 mL | Freq: Every day | ORAL | Status: DC | PRN
Start: 2022-03-26 — End: 2022-03-28

## 2022-03-26 MED ORDER — ACETAMINOPHEN 650 MG RE SUPP: 650.0000 mg | Freq: Four times a day (QID) | RECTAL | Status: DC | PRN

## 2022-03-26 MED ORDER — HYDRALAZINE HCL 25 MG PO TABS
25.0000 mg | ORAL_TABLET | Freq: Three times a day (TID) | ORAL | Status: DC
  Administered 2022-03-26 – 2022-03-28 (×7): 25 mg via ORAL
  Filled 2022-03-26 (×7): qty 1

## 2022-03-26 MED ORDER — HYDRALAZINE HCL 20 MG/ML IJ SOLN
5.0000 mg | INTRAMUSCULAR | Status: DC | PRN
Start: 2022-03-26 — End: 2022-03-28

## 2022-03-26 MED ORDER — PANTOPRAZOLE SODIUM 40 MG PO TBEC
40.0000 mg | DELAYED_RELEASE_TABLET | Freq: Every day | ORAL | Status: DC
  Administered 2022-03-26 – 2022-03-28 (×3): 40 mg via ORAL
  Filled 2022-03-26 (×3): qty 1

## 2022-03-26 MED ORDER — VENLAFAXINE HCL ER 150 MG PO CP24
150.0000 mg | ORAL_CAPSULE | Freq: Every day | ORAL | Status: DC
Start: 2022-03-26 — End: 2022-03-26
  Administered 2022-03-26: 150 mg via ORAL
  Filled 2022-03-26: qty 1

## 2022-03-26 MED ORDER — ACETAMINOPHEN 325 MG PO TABS: 650.0000 mg | ORAL_TABLET | Freq: Four times a day (QID) | ORAL | Status: DC | PRN

## 2022-03-26 MED ORDER — AMLODIPINE BESYLATE 5 MG PO TABS
5.0000 mg | ORAL_TABLET | Freq: Every day | ORAL | Status: DC
  Administered 2022-03-26 – 2022-03-28 (×3): 5 mg via ORAL
  Filled 2022-03-26 (×3): qty 1

## 2022-03-26 MED ORDER — NICOTINE 21 MG/24HR TD PT24
21.0000 mg | MEDICATED_PATCH | Freq: Every day | TRANSDERMAL | Status: DC
  Administered 2022-03-26 – 2022-03-28 (×3): 21 mg via TRANSDERMAL
  Filled 2022-03-26 (×3): qty 1

## 2022-03-26 MED ORDER — ENOXAPARIN SODIUM 40 MG/0.4ML IJ SOSY
40.0000 mg | PREFILLED_SYRINGE | INTRAMUSCULAR | Status: DC
  Administered 2022-03-26: 40 mg via SUBCUTANEOUS
  Filled 2022-03-26: qty 0.4

## 2022-03-26 MED ORDER — ONDANSETRON HCL 4 MG/2ML IJ SOLN: 4.0000 mg | Freq: Four times a day (QID) | INTRAMUSCULAR | Status: DC | PRN

## 2022-03-26 MED ORDER — ATORVASTATIN CALCIUM 20 MG PO TABS
40.0000 mg | ORAL_TABLET | Freq: Every day | ORAL | Status: DC
  Administered 2022-03-26 – 2022-03-28 (×3): 40 mg via ORAL
  Filled 2022-03-26 (×3): qty 2

## 2022-03-26 MED ORDER — ALBUTEROL SULFATE (2.5 MG/3ML) 0.083% IN NEBU: 2.5000 mg | INHALATION_SOLUTION | Freq: Four times a day (QID) | RESPIRATORY_TRACT | Status: DC | PRN

## 2022-03-26 MED ORDER — FUROSEMIDE 10 MG/ML IJ SOLN
40.0000 mg | Freq: Once | INTRAMUSCULAR | Status: AC
  Administered 2022-03-26: 40 mg via INTRAVENOUS
  Filled 2022-03-26: qty 4

## 2022-03-26 MED ORDER — UMECLIDINIUM BROMIDE 62.5 MCG/ACT IN AEPB: 1.0000 | INHALATION_SPRAY | Freq: Every day | RESPIRATORY_TRACT | Status: DC

## 2022-03-26 MED ORDER — DIPHENHYDRAMINE HCL 50 MG/ML IJ SOLN: 12.5000 mg | Freq: Three times a day (TID) | INTRAMUSCULAR | Status: DC | PRN

## 2022-03-26 MED ORDER — CALCIUM CARBONATE 1250 (500 CA) MG PO TABS
1.0000 | ORAL_TABLET | Freq: Three times a day (TID) | ORAL | Status: DC
  Administered 2022-03-26 – 2022-03-28 (×7): 1250 mg via ORAL
  Filled 2022-03-26 (×8): qty 1

## 2022-03-26 MED ORDER — FUROSEMIDE 10 MG/ML IJ SOLN
40.0000 mg | Freq: Two times a day (BID) | INTRAMUSCULAR | Status: DC
  Administered 2022-03-26 – 2022-03-27 (×3): 40 mg via INTRAVENOUS
  Filled 2022-03-26 (×3): qty 4

## 2022-03-26 MED ORDER — TRAZODONE HCL 50 MG PO TABS: 25.0000 mg | ORAL_TABLET | Freq: Every evening | ORAL | Status: DC | PRN

## 2022-03-26 MED ORDER — ONDANSETRON HCL 4 MG PO TABS: 4.0000 mg | ORAL_TABLET | Freq: Four times a day (QID) | ORAL | Status: DC | PRN

## 2022-03-26 MED ORDER — DM-GUAIFENESIN ER 30-600 MG PO TB12: 1.0000 | ORAL_TABLET | Freq: Two times a day (BID) | ORAL | Status: DC | PRN

## 2022-03-26 MED ORDER — BUPROPION HCL ER (XL) 150 MG PO TB24: 150.0000 mg | ORAL_TABLET | Freq: Every day | ORAL | Status: DC

## 2022-03-26 NOTE — Assessment & Plan Note (Signed)
-  Continue Norvasc, hydralazine

## 2022-03-26 NOTE — Assessment & Plan Note (Signed)
Renal function close to baseline -Follow-up with BMP

## 2022-03-26 NOTE — Assessment & Plan Note (Signed)
-   Bronchodilators 

## 2022-03-26 NOTE — Assessment & Plan Note (Signed)
Myocardial injury due to CHF exacerbation: Troponin level 37, 27.  No chest pain. -Lipitor -Will not start aspirin due to history of [place] on 12/16/2021. -Check FLP -Patient had A1c 6.0 on 02/28/2022

## 2022-03-26 NOTE — Assessment & Plan Note (Signed)
2D echo on 12/11/21 showed EF of 65-70%.  Patient has positive JVD, fine crackles on auscultation, vascular congestion on chest x-ray, elevated BNP 1712, clinically consistent with CHF exacerbation.  Consulted Dr. Saunders Revel of cardiology. --received IV lasix 40 mg x4 with Cr increase Plan: --transition to oral lasix 40 mg daily tomorrow, per cardio

## 2022-03-26 NOTE — Consult Note (Signed)
Cardiology Consultation:   Patient ID: Dawn Sherman MRN: [id number]; DOB: Jul 05, 1956  Admit date: 03/25/2022 Date of Consult: 03/26/2022  PCP:  [name], MD   [facility] Providers Cardiologist:  Ida Rogue, MD        Patient Profile:   Dawn Sherman is a 66 y.o. female with a hx of tobacco use, prior alcohol use, CKD, HTN, CHF, paroxysmal atrial fibrillation not on anticoagulation due to previous [facility] who is being seen 03/26/2022 for the evaluation of CHF exacerbation at the request of Dr. Blaine Hamper.  History of Present Illness:   Dawn Sherman who is followed by Dr. Rockey Situ for the above mentioned cardiac history. She was admitted to [facility] 02/10/2020 for dyspnea. CXR revealed COPD and chronic changes without active disease. CTA chest was negative for PE but showed small pleural effusions, interstitial edema, mild aortic atherosclerosis. She was has new onset atrial fibrillation. At that time she declined cardiac cath and subsequently had a Lexiscan Myoview showing no significant ischemia. Echo 02/13/20 showed LVEF 30-35%, G2DD, RV normal size and function and mildly elevated PASP, moderate MR, mild to moderate TR.  02/21/2020 she was noted to be hyperkalemic but repeat labs are stable she was encouraged to follow-up with her nephrologist.  Her Eliquis had not been started at discharge due to necessity for reducing dosing due to weight and renal function and she was later started on Eliquis 2.5 mg twice daily.  Echocardiogram was completed on 08/09/2020 which showed LVEF of 62%, grade 1 diastolic dysfunction.  Lasix was subsequently discontinued due to elevated creatinine levels.  She presented to [facility] on 12/10/2021 for fall.  Says she started feeling sick about a week prior with nausea vomiting and fever.  She spent 2 days sleeping and just felt bad.  She felt dizzy during these days as well.  She stated 2 days prior she was walking into her kitchen and fell like she was going to fall.  She  felt weakness in her legs and dizziness since she fell against the wall onto the left side.  The next day she called her PCP who recommended she come to the emergency department for evaluation.  She denied chest pain, shortness of breath or lower extremity edema.  And reported compliance with all of her medications. She was evaluated for the use of anticoagulation in the setting of SAH.  She presented to the emergency department on 03/15/2022 with weakness, nausea, and vomiting.  On exam she stated that she had had symptoms of shortness of breath for 2 days prior when she was walking up steps carrying things like groceries or boxes.  Her shortness of breath and dyspnea was only noted on exertion.  She states that she had had several episodes of nausea with vomiting but since being treated upon arrival to the emergency department she has had no further episodes.  She denies any chest pain any current shortness of breath, abdominal pain, nausea, vomiting, or bilateral lower extremity swelling.  Initial vital signs: Blood pressure 124/56, pulse 67, respirations of 20, temperature of 98  Pertinent labs: CO2 of 20, BUN 29, creatinine 2.37, calcium 8.8, GFR of 22, hemoglobin of 10.2, hematocrit of 34.6, BNP 1712, high-sensitivity troponin 37 and 27  Medications given in the emergency department: Furosemide 40 mg IVP, zofran   Past Medical History:  Diagnosis Date   B12 deficiency    CHF (congestive heart failure) (HCC)    CKD (chronic kidney disease), stage III ([place])  Depression    Endometriosis    Hypertension    Mixed hyperlipidemia    Tobacco abuse     Past Surgical History:  Procedure Laterality Date   ABDOMINAL HYSTERECTOMY     BREAST BIOPSY Right 2015   neg-  FIBROADENOMA   TAH/RSO  1999   secondary to bleeding and endometriosis (Dr Vernie Ammons)     Home Medications:  Prior to Admission medications   Medication Sig Start Date End Date Taking? Authorizing Provider  amLODipine  (NORVASC) 10 MG tablet Take 1 tablet (10 mg total) by mouth daily. Patient taking differently: Take 5 mg by mouth daily. 12/14/21  Yes Fritzi Mandes, MD  atorvastatin (LIPITOR) 40 MG tablet TAKE ONE TABLET BY MOUTH DAILY 02/27/22  Yes [name], MD  buPROPion (WELLBUTRIN XL) 150 MG 24 hr tablet TAKE ONE TABLET BY MOUTH DAILY 01/09/22  Yes [name], MD  calcium carbonate (OS-CAL - DOSED IN MG OF ELEMENTAL CALCIUM) 1250 (500 Ca) MG tablet Take 1 tablet (500 mg of elemental calcium total) by mouth 3 (three) times daily with meals. 12/14/21  Yes Fritzi Mandes, MD  FARXIGA 10 MG TABS tablet Take 10 mg by mouth daily. 11/20/21  Yes [provider]  ferrous sulfate 325 (65 FE) MG tablet Take 1 tablet (325 mg total) by mouth 2 (two) times daily with a meal. 12/14/21  Yes Fritzi Mandes, MD  Fluticasone-Umeclidin-Vilant (TRELEGY ELLIPTA) 100-62.5-25 MCG/ACT AEPB Inhale 1 puff into the lungs daily. 03/05/22  Yes [name], MD  furosemide (LASIX) 40 MG tablet TAKE 1 TABLET BY MOUTH EVERY OTHER DAY 03/24/22  Yes Dutch Quint B, FNP  hydrALAZINE (APRESOLINE) 25 MG tablet Take 1 tablet (25 mg total) by mouth 3 (three) times daily. 01/31/22  Yes Hackney, Tina A, FNP  loratadine (CLARITIN) 10 MG tablet Take 10 mg by mouth daily.   Yes [provider]  omeprazole (PRILOSEC) 20 MG capsule Take 20 mg by mouth daily.   Yes [provider]  venlafaxine XR (EFFEXOR-XR) 150 MG 24 hr capsule TAKE 1 CAPSULE BY MOUTH EVERY DAY 11/27/21  Yes [name], MD  acetaminophen (TYLENOL) 325 MG tablet Take 650 mg by mouth every 6 (six) hours as needed.    [provider]  albuterol (VENTOLIN HFA) 108 (90 Base) MCG/ACT inhaler Inhale 2 puffs into the lungs every 6 (six) hours as needed for wheezing or shortness of breath. 03/14/22   Lucrezia Starch, MD  PHOS-NAK [phone] MG PACK  12/17/21   [provider]  sodium bicarbonate 650 MG tablet Take 1 tablet (650 mg total) by mouth 2  (two) times daily. Patient not taking: Reported on 03/26/2022 12/14/21   Fritzi Mandes, MD    Inpatient Medications: Scheduled Meds:  amLODipine  5 mg Oral Daily   atorvastatin  40 mg Oral Daily   calcium carbonate  1 tablet Oral TID WC   ferrous sulfate  325 mg Oral BID WC   [START ON 03/27/2022] fluticasone furoate-vilanterol  1 puff Inhalation Daily   And   [START ON 03/27/2022] umeclidinium bromide  1 puff Inhalation Daily   furosemide  40 mg Intravenous BID   hydrALAZINE  25 mg Oral TID   loratadine  10 mg Oral Daily   nicotine  21 mg Transdermal Daily   pantoprazole  40 mg Oral Daily   Continuous Infusions:  PRN Meds: acetaminophen **OR** acetaminophen, albuterol, dextromethorphan-guaiFENesin, diphenhydrAMINE, hydrALAZINE, magnesium hydroxide, traZODone  Allergies:    Allergies  Allergen Reactions  Sulfate Rash   Codeine Sulfate Nausea Only   Benicar [Olmesartan]     Talked with patient February 10, 2020, intolerance is unclear, tried several medications around that time and one of them gave her a rash but she is not clear which 1.   Amoxicillin Rash   Clindamycin/Lincomycin Rash   Morphine And Related Rash   Penicillins Rash    Social History:   Social History   Socioeconomic History   Marital status: Married    Spouse name: Not on file   Number of children: Not on file   Years of education: Not on file   Highest education level: Not on file  Occupational History   Not on file  Tobacco Use   Smoking status: Every Day    Packs/day: 0.25    Years: 40.00    Total pack years: 10.00    Types: Cigarettes   Smokeless tobacco: Never  Vaping Use   Vaping Use: Never used  Substance and Sexual Activity   Alcohol use: Not Currently    Alcohol/week: 0.0 standard drinks of alcohol    Comment: occasional   Drug use: No   Sexual activity: Not Currently  Other Topics Concern   Not on file  Social History Narrative   Lives in [place] with her husband.  She is retired from  [employer].  She does not routinely exercise.   Social Determinants of Health   Financial Resource Strain: Low Risk  (11/27/2021)   Overall Financial Resource Strain (CARDIA)    Difficulty of Paying Living Expenses: Not hard at all  Food Insecurity: No Food Insecurity (11/27/2021)   Hunger Vital Sign    Worried About Running Out of Food in the Last Year: Never true    Ran Out of Food in the Last Year: Never true  Transportation Needs: No Transportation Needs (11/27/2021)   PRAPARE - Hydrologist (Medical): No    Lack of Transportation (Non-Medical): No  Physical Activity: Sufficiently Active (11/27/2021)   Exercise Vital Sign    Days of Exercise per Week: 7 days    Minutes of Exercise per Session: 30 min  Stress: No Stress Concern Present (11/27/2021)   [place]    Feeling of Stress : Not at all  Social Connections: Unknown (11/27/2021)   Social Connection and Isolation Panel [NHANES]    Frequency of Communication with Friends and Family: Not on file    Frequency of Social Gatherings with Friends and Family: Not on file    Attends Religious Services: Not on file    Active Member of Clubs or Organizations: Not on file    Attends Archivist Meetings: Not on file    Marital Status: Married  Intimate Partner Violence: Not At Risk (11/27/2021)   Humiliation, Afraid, Rape, and Kick questionnaire    Fear of Current or Ex-Partner: No    Emotionally Abused: No    Physically Abused: No    Sexually Abused: No    Family History:    Family History  Problem Relation Age of Onset   Hypercholesterolemia Mother    Rheum arthritis Father    Rheum arthritis Daughter    Fibromyalgia Daughter    Breast cancer Neg Hx    Colon cancer Neg Hx      ROS:  Please see the history of present illness.  Review of Systems  Constitutional:  Positive for malaise/fatigue.  HENT: Negative.  Eyes: Negative.   Respiratory:  Positive for shortness of breath.   Cardiovascular: Negative.   Gastrointestinal:  Positive for nausea and vomiting.  Genitourinary: Negative.   Musculoskeletal: Negative.   Skin: Negative.   Neurological:  Positive for weakness.  Endo/Heme/Allergies: Negative.   Psychiatric/Behavioral: Negative.      All other ROS reviewed and negative.     Physical Exam/Data:   Vitals:   03/26/22 0630 03/26/22 0700 03/26/22 0800 03/26/22 0824  BP: (!) 147/64 136/73  (!) 115/50  Pulse: 87 84 90 91  Resp: (!) 22 19 19 19   Temp:   98.3 F (36.8 C)   TempSrc:   Oral   SpO2: 97% 99% 97% 98%  Weight:      Height:       No intake or output data in the 24 hours ending 03/26/22 0922    03/25/2022    6:00 PM 03/19/2022   12:49 PM 03/14/2022    3:06 PM  Last 3 Weights  Weight (lbs) 120 lb 121 lb 6 oz 123 lb  Weight (kg) 54.432 kg 55.055 kg 55.792 kg     Body mass index is 19.97 kg/m.  General:  Well nourished, well developed, in no acute distress HEENT: normal Neck: no JVD Vascular: No carotid bruits; Distal pulses 2+ bilaterally Cardiac:  normal S1, S2; RRR; I/VI murmur  Lungs:  coarse to auscultation bilaterally, no wheezing, rhonchi or rales, respirations are unlabored at rest on room air Abd: soft, nontender, no hepatomegaly  Ext: no edema Musculoskeletal:  No deformities, BUE and BLE strength normal and equal Skin: warm and dry  Neuro:  CNs 2-12 intact, no focal abnormalities noted Psych:  Normal affect   EKG:  The EKG was personally reviewed and demonstrates:  sinus rate of 85 with first degree AVB and sinus pause Telemetry:  Telemetry was personally reviewed and demonstrates:  sinus with first degree avb  Relevant CV Studies: Echocardiogram 12/11/2021 1. Left ventricular ejection fraction, by estimation, is 65 to 70%. The  left ventricle has normal function. The left ventricle has no regional  wall motion abnormalities. There is mild left  ventricular hypertrophy.  Left ventricular diastolic parameters  are indeterminate.   2. Right ventricular systolic function is normal. The right ventricular  size is normal. There is normal pulmonary artery systolic pressure.   3. Left atrial size was mildly dilated.   4. The mitral valve is degenerative. Mild mitral valve regurgitation. No  evidence of mitral stenosis.   5. The aortic valve has an indeterminant number of cusps. There is mild  thickening of the aortic valve. Aortic valve regurgitation is not  visualized. Aortic valve sclerosis is present, with no evidence of aortic  valve stenosis.   6. The inferior vena cava is normal in size with greater than 50%  respiratory variability, suggesting right atrial pressure of 3 mmHg.   Laboratory Data:  High Sensitivity Troponin:   Recent Labs  Lab 03/14/22 1505 03/14/22 1924 03/25/22 1802 03/26/22 0115  TROPONINIHS 21* 23* 37* 27*     Chemistry Recent Labs  Lab 03/25/22 1802  NA 140  K 4.8  CL 110  CO2 20*  GLUCOSE 92  BUN 29*  CREATININE 2.37*  CALCIUM 8.8*  MG 2.2  GFRNONAA 22*  ANIONGAP 10    Recent Labs  Lab 03/25/22 1802  PROT 6.8  ALBUMIN 3.9  AST 19  ALT 16  ALKPHOS 113  BILITOT 0.7   Lipids No results for input(s): "CHOL", "  TRIG", "HDL", "LABVLDL", "LDLCALC", "CHOLHDL" in the last 168 hours.  Hematology Recent Labs  Lab 03/25/22 1802  WBC 9.9  RBC 4.06  HGB 10.2*  HCT 34.6*  MCV 85.2  MCH 25.1*  MCHC 29.5*  RDW 21.3*  PLT 323   Thyroid No results for input(s): "TSH", "FREET4" in the last 168 hours.  BNP Recent Labs  Lab 03/25/22 1802  BNP 1,712.2*    DDimer No results for input(s): "DDIMER" in the last 168 hours.   Radiology/Studies:  DG Chest 2 View  Result Date: 03/25/2022 CLINICAL DATA:  Shortness of breath. EXAM: CHEST - 2 VIEW COMPARISON:  Chest radiograph dated 03/14/2022. FINDINGS: There is diffuse chronic interstitial coarsening. Left mid lung field and right lung base  linear atelectasis/scarring. No focal consolidation, pleural effusion, pneumothorax. There is mild cardiomegaly with probable mild central vascular congestion. There is degenerative changes of the spine and scoliosis. No acute osseous pathology. Old fracture of the left lateral chest wall with associated chest wall deformity and pleural thickening. IMPRESSION: Mild cardiomegaly with probable mild central vascular congestion. No focal consolidation. Electronically Signed   By: Anner Crete M.D.   On: 03/25/2022 23:29     Assessment and Plan:   Acute on chronic HFpEF -LVEF 65 to 70% on 12/11/21 -BNP 1712 -Hypoxic on arrival to the emergency department requiring oxygen therapy at 2L -Complaints with dyspnea on exertion and coarse lung sounds -Chest x-ray reveals vascular congestion -Continue furosemide 40 mg IV twice daily -We will continue with diuresis as tolerated and kidney function allows -Daily weight, I&O, low-sodium foods  Hypertension -Blood pressure 133/66 -Continue amlodipine 5 mg daily -Continue hydralazine 25 mg 3 times daily -Vital signs per unit protocol  Paroxysmal atrial fibrillation -Currently sinus rhythm -Patient previously was on Eliquis 2.5 mg twice daily, she had a hospitalization 4/23 status post mechanical fall which led to an [facility], Eliquis was to be restarted after cleared by neurosurgery -Patient states she is not on any anticoagulant -CHA2DS2-VASc of at least 3  Elevated troponin -patient remains pain free -no ischemic changes noted on EKG -trended 37 and 27 -patient with chronically elevated troponins -EKG PRN pain or changes  Hyperlipidemia -ldl 85 02/28/22 -Continue atorvastatin 40 mg daily  Acute on chronic CKD stage III -Serum creatinine 2.37 -Baseline serum creatinine 1.8 to 2.0 -Daily BMP -Avoid additional nephrotoxic agents -Monitor and trend daily if an increase in creatinine is continue to be noted we will need to decrease her  diuretic  Risk Assessment/Risk Scores:        New York Heart Association (NYHA) Functional Class NYHA Class II        For questions or updates, please contact [facility] Please consult [email or web address] for contact info under    Signed, Mckenleigh Tarlton, NP  03/26/2022 9:22 AM

## 2022-03-26 NOTE — Assessment & Plan Note (Signed)
Continue iron supplement.

## 2022-03-26 NOTE — Assessment & Plan Note (Signed)
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

## 2022-03-26 NOTE — H&P (Addendum)
History and Physical    [name] UUV:[id number] DOB: 1955-11-12 DOA: 03/25/2022  Referring MD/NP/PA:   PCP: [name], MD   Patient coming from:  The patient is coming from home.  At baseline, pt is independent for most of ADL.        Chief Complaint: SOB  HPI: Dawn Sherman is a 66 y.o. female with medical history significant of hypertension, hyperlipidemia, GERD, depression, tobacco abuse, alcohol abuse in remission, CKD-IV,  dCHF (used to have sCHF with EF 30-35%, improved to EF 65-70 by 2D Echo on 12/11/21), SDH, PAF not on Eliquis, second-degree AV block, presents with SOB.  Patient states that she has shortness of breath for more than 3 days, which has been progressively worsening.  Patient has mild dry cough, no chest pain, fever or chills.  She states that her shortness breath is worse when laying flat.  She states that she has had several episodes of nonbilious nonbloody vomiting earlier, which has resolved now.  Currently no nausea, vomiting, diarrhea or abdominal pain.  No symptoms of UTI.  Patient is normally not using oxygen at home, but was found to have oxygen desaturation to 88% on room air, which improved to 99% on 2 L oxygen.  Patient does not have respiratory distress currently.   Data reviewed independently and ED Course: pt was found to have BNP 1712, troponin level 37, 27, lipase 21, liver function normal, negative urinalysis, renal function close to baseline, temperature normal, blood pressure 136/73, heart rate 57, 84, RR 22, chest x-ray showed cardiomegaly and vascular congestion.  Patient is admitted to telemetry bed as inpatient.  Dr. Saunders Revel of cardiology is consulted.   EKG: I have personally reviewed.  Sinus rhythm, QTc 556, T wave inversion in inferior leads and V4-V6, PR interval 369, with occasional skipped beat, seem to be type I second-degree AV block.   Review of Systems:   General: no fevers, chills, no body weight gain, fatigue HEENT: no blurry  vision, hearing changes or sore throat Respiratory: has dyspnea, coughing, no wheezing CV: no chest pain, no palpitations GI: had nausea, vomiting, no abdominal pain, diarrhea, constipation GU: no dysuria, burning on urination, increased urinary frequency, hematuria  Ext: No leg edema Neuro: no unilateral weakness, numbness, or tingling, no vision change or hearing loss Skin: no rash, no skin tear. MSK: No muscle spasm, no deformity, no limitation of range of movement in spin Heme: No easy bruising.  Travel history: No recent long distant travel.   Allergy:  Allergies  Allergen Reactions   Sulfate Rash   Codeine Sulfate Nausea Only   Benicar [Olmesartan]     Talked with patient February 10, 2020, intolerance is unclear, tried several medications around that time and one of them gave her a rash but she is not clear which 1.   Amoxicillin Rash   Clindamycin/Lincomycin Rash   Morphine And Related Rash   Penicillins Rash    Past Medical History:  Diagnosis Date   B12 deficiency    CHF (congestive heart failure) (HCC)    CKD (chronic kidney disease), stage III ([place])    Depression    Endometriosis    Hypertension    Mixed hyperlipidemia    Tobacco abuse     Past Surgical History:  Procedure Laterality Date   ABDOMINAL HYSTERECTOMY     BREAST BIOPSY Right 2015   neg-  FIBROADENOMA   TAH/RSO  1999   secondary to bleeding and endometriosis (Dr Vernie Ammons)  Social History:  reports that she has been smoking cigarettes. She has a 10.00 pack-year smoking history. She has never used smokeless tobacco. She reports that she does not currently use alcohol. She reports that she does not use drugs.  Family History:  Family History  Problem Relation Age of Onset   Hypercholesterolemia Mother    Rheum arthritis Father    Rheum arthritis Daughter    Fibromyalgia Daughter    Breast cancer Neg Hx    Colon cancer Neg Hx      Prior to Admission medications   Medication Sig Start Date  End Date Taking? Authorizing Provider  amLODipine (NORVASC) 10 MG tablet Take 1 tablet (10 mg total) by mouth daily. Patient taking differently: Take 5 mg by mouth daily. 12/14/21  Yes Fritzi Mandes, MD  atorvastatin (LIPITOR) 40 MG tablet TAKE ONE TABLET BY MOUTH DAILY 02/27/22  Yes [name], MD  buPROPion (WELLBUTRIN XL) 150 MG 24 hr tablet TAKE ONE TABLET BY MOUTH DAILY 01/09/22  Yes [name], MD  calcium carbonate (OS-CAL - DOSED IN MG OF ELEMENTAL CALCIUM) 1250 (500 Ca) MG tablet Take 1 tablet (500 mg of elemental calcium total) by mouth 3 (three) times daily with meals. 12/14/21  Yes Fritzi Mandes, MD  FARXIGA 10 MG TABS tablet Take 10 mg by mouth daily. 11/20/21  Yes [provider]  ferrous sulfate 325 (65 FE) MG tablet Take 1 tablet (325 mg total) by mouth 2 (two) times daily with a meal. 12/14/21  Yes Fritzi Mandes, MD  Fluticasone-Umeclidin-Vilant (TRELEGY ELLIPTA) 100-62.5-25 MCG/ACT AEPB Inhale 1 puff into the lungs daily. 03/05/22  Yes [name], MD  furosemide (LASIX) 40 MG tablet TAKE 1 TABLET BY MOUTH EVERY OTHER DAY 03/24/22  Yes Dutch Quint B, FNP  hydrALAZINE (APRESOLINE) 25 MG tablet Take 1 tablet (25 mg total) by mouth 3 (three) times daily. 01/31/22  Yes Hack[place], Tina A, FNP  loratadine (CLARITIN) 10 MG tablet Take 10 mg by mouth daily.   Yes [provider]  omeprazole (PRILOSEC) 20 MG capsule Take 20 mg by mouth daily.   Yes [provider]  venlafaxine XR (EFFEXOR-XR) 150 MG 24 hr capsule TAKE 1 CAPSULE BY MOUTH EVERY DAY 11/27/21  Yes [name], MD  acetaminophen (TYLENOL) 325 MG tablet Take 650 mg by mouth every 6 (six) hours as needed.    [provider]  albuterol (VENTOLIN HFA) 108 (90 Base) MCG/ACT inhaler Inhale 2 puffs into the lungs every 6 (six) hours as needed for wheezing or shortness of breath. 03/14/22   Lucrezia Starch, MD  PHOS-NAK [phone] MG PACK  12/17/21   [provider]  sodium bicarbonate 650 MG  tablet Take 1 tablet (650 mg total) by mouth 2 (two) times daily. Patient not taking: Reported on 03/26/2022 12/14/21   Fritzi Mandes, MD    Physical Exam: Vitals:   03/26/22 1430 03/26/22 1500 03/26/22 1530 03/26/22 1634  BP: 134/60 125/73  (!) 146/76  Pulse: 82 85  90  Resp: 16 16  17   Temp:   98.5 F (36.9 C) 97.6 F (36.4 C)  TempSrc:   Oral   SpO2: 100% 100%  99%  Weight:      Height:       General: Not in acute distress HEENT:       Eyes: PERRL, EOMI, no scleral icterus.       ENT: No discharge from the ears and nose, no pharynx injection, no tonsillar enlargement.  Neck: positive JVD, no bruit, no mass felt. Heme: No neck lymph node enlargement. Cardiac: S1/S2, RRR, No murmurs, No gallops or rubs. Respiratory: has fine crackles bilaterally GI: Soft, nondistended, nontender, no rebound pain, no organomegaly, BS present. GU: No hematuria Ext: No leg edema bilaterally. 1+DP/PT pulse bilaterally. Musculoskeletal: No joint deformities, No joint redness or warmth, no limitation of ROM in spin. Skin: No rashes.  Neuro: Alert, oriented X3, cranial nerves II-XII grossly intact, moves all extremities normally.  Psych: Patient is not psychotic, no suicidal or hemocidal ideation.  Labs on Admission: I have personally reviewed following labs and imaging studies  CBC: Recent Labs  Lab 03/25/22 1802  WBC 9.9  HGB 10.2*  HCT 34.6*  MCV 85.2  PLT 272   Basic Metabolic Panel: Recent Labs  Lab 03/25/22 1802  NA 140  K 4.8  CL 110  CO2 20*  GLUCOSE 92  BUN 29*  CREATININE 2.37*  CALCIUM 8.8*  MG 2.2   GFR: Estimated Creatinine Clearance: 20.1 mL/min (A) (by C-G formula based on SCr of 2.37 mg/dL (H)). Liver Function Tests: Recent Labs  Lab 03/25/22 1802  AST 19  ALT 16  ALKPHOS 113  BILITOT 0.7  PROT 6.8  ALBUMIN 3.9   Recent Labs  Lab 03/25/22 1802  LIPASE 21   No results for input(s): "AMMONIA" in the last 168 hours. Coagulation Profile: No  results for input(s): "INR", "PROTIME" in the last 168 hours. Cardiac Enzymes: No results for input(s): "CKTOTAL", "CKMB", "CKMBINDEX", "TROPONINI" in the last 168 hours. BNP (last 3 results) No results for input(s): "PROBNP" in the last 8760 hours. HbA1C: No results for input(s): "HGBA1C" in the last 72 hours. CBG: No results for input(s): "GLUCAP" in the last 168 hours. Lipid Profile: No results for input(s): "CHOL", "HDL", "LDLCALC", "TRIG", "CHOLHDL", "LDLDIRECT" in the last 72 hours. Thyroid Function Tests: No results for input(s): "TSH", "T4TOTAL", "FREET4", "T3FREE", "THYROIDAB" in the last 72 hours. Anemia Panel: No results for input(s): "VITAMINB12", "FOLATE", "FERRITIN", "TIBC", "IRON", "RETICCTPCT" in the last 72 hours. Urine analysis:    Component Value Date/Time   COLORURINE YELLOW (A) 03/25/2022 2210   APPEARANCEUR CLEAR (A) 03/25/2022 2210   LABSPEC 1.016 03/25/2022 2210   PHURINE 5.0 03/25/2022 2210   GLUCOSEU NEGATIVE 03/25/2022 2210   GLUCOSEU NEGATIVE 01/22/2017 1052   HGBUR NEGATIVE 03/25/2022 2210   BILIRUBINUR NEGATIVE 03/25/2022 2210   KETONESUR NEGATIVE 03/25/2022 2210   PROTEINUR >=300 (A) 03/25/2022 2210   UROBILINOGEN 0.2 01/22/2017 1052   NITRITE NEGATIVE 03/25/2022 2210   LEUKOCYTESUR NEGATIVE 03/25/2022 2210   Sepsis Labs: @LABRCNTIP (procalcitonin:4,lacticidven:4) )No results found for this or any previous visit (from the past 240 hour(s)).   Radiological Exams on Admission: DG Chest 2 View  Result Date: 03/25/2022 CLINICAL DATA:  Shortness of breath. EXAM: CHEST - 2 VIEW COMPARISON:  Chest radiograph dated 03/14/2022. FINDINGS: There is diffuse chronic interstitial coarsening. Left mid lung field and right lung base linear atelectasis/scarring. No focal consolidation, pleural effusion, pneumothorax. There is mild cardiomegaly with probable mild central vascular congestion. There is degenerative changes of the spine and scoliosis. No acute osseous  pathology. Old fracture of the left lateral chest wall with associated chest wall deformity and pleural thickening. IMPRESSION: Mild cardiomegaly with probable mild central vascular congestion. No focal consolidation. Electronically Signed   By: Anner Crete M.D.   On: 03/25/2022 23:29      Assessment/Plan Principal Problem:   Acute on chronic diastolic CHF (congestive heart failure) (HCC) Active  Problems:   Myocardial injury   Paroxysmal A-fib (HCC)   CKD (chronic kidney disease), stage IV (HCC)   Hypertension   HLD (hyperlipidemia)   Tobacco abuse   Chronic obstructive pulmonary disease (HCC)   Subarachnoid hemorrhage (HCC)   Second degree AV block   Iron deficiency anemia   Assessment and Plan: * Acute on chronic diastolic CHF (congestive heart failure) ([place]) 2D echo on 12/11/21 showed EF of 65-70%.  Patient has positive JVD, fine crackles on auscultation, vascular congestion on chest x-ray, elevated BNP 1712, clinically consistent with CHF exacerbation.  Consulted Dr. Saunders Revel of cardiology.  -Will admit to tele as inpatient -Lasix 40 mg bid by IV -Daily weights -strict I/O's -Low salt diet -Fluid restriction -Obtain REDs Vest reading    Myocardial injury Myocardial injury due to CHF exacerbation: Troponin level 37, 27.  No chest pain. -Lipitor -Will not start aspirin due to history of [place] on 12/16/2021. -Check FLP -Patient had A1c 6.0 on 02/28/2022  Paroxysmal A-fib ([place]) Patient has AV block, seems to be type I secondary AV block -Will not give nodal block - Not on anticoagulants due to history of ALH.  CKD (chronic kidney disease), stage IV (HCC) Renal function close to baseline -Follow-up with BMP  Hypertension - IV hydralazine as needed -Continue home amlodipine, oral hydralazine, -Patient is on IV Lasix  HLD (hyperlipidemia) - Lipitor  Chronic obstructive pulmonary disease (HCC) - Bronchodilators  Tobacco abuse - Nicotine patch  Subarachnoid  hemorrhage (HCC) - Avoid using anticoagulants  Second degree AV block Patient seem to have type I secondary AV block -Avoid using nodal blocker -Follow-up cardiologist recommendation  Iron deficiency anemia - Continue iron supplement          DVT ppx: SCD  Code Status: Full code  Family Communication: I offered to call her family, but patient states that I do not need to call her family.  Disposition Plan:  Anticipate discharge back to previous environment  Consults called:  Dr. Saunders Revel of card  Admission status and Level of care: Telemetry Cardiac:  as inpt        Severity of Illness:  The appropriate patient status for this patient is INPATIENT. Inpatient status is judged to be reasonable and necessary in order to provide the required intensity of service to ensure the patient's safety. The patient's presenting symptoms, physical exam findings, and initial radiographic and laboratory data in the context of their chronic comorbidities is felt to place them at high risk for further clinical deterioration. Furthermore, it is not anticipated that the patient will be medically stable for discharge from the hospital within 2 midnights of admission.   * I certify that at the point of admission it is my clinical judgment that the patient will require inpatient hospital care spanning beyond 2 midnights from the point of admission due to high intensity of service, high risk for further deterioration and high frequency of surveillance required.*       Date of Service 03/26/2022    Ivor Costa [facility]   If 7PM-7AM, please contact night-coverage [email or web address] 03/26/2022, 6:58 PM

## 2022-03-26 NOTE — ED Notes (Signed)
Pt ambulated with pulse ox, pt SPO2 dropped to 88%. EDP Ward made aware. Pt SPO2 now 93%

## 2022-03-26 NOTE — Assessment & Plan Note (Signed)
-   Avoid using anticoagulants

## 2022-03-26 NOTE — Consult Note (Signed)
   Heart Failure Nurse Navigator Note'    HFpEF 65 to 70%.  Mild LVH.  Mild left atrial enlargement.   She presented to the emergency room with complaints of worsening shortness of breath, also complained of double vision, feeling swimmy headed and nausea and vomiting.  Comorbidities:  Depression Hypertension Hyperlipidemia GERD Previous alcohol abuse Continued tobacco abuse Paroxysmal atrial fibrillation not on NOAC History of subarachnoid hemorrhage Chronic kidney disease stage III  Medications:  Amlodipine 5 mg daily Atorvastatin 40 mg daily Furosemide 40 mg IV twice daily Hydralazine 25 mg 3 times daily NicoDerm patch 21 mg daily  Labs:  BNP 1712, sodium 140, potassium 4.8, chloride 110, CO2 20, BUN 29, creatinine 2.37, magnesium 2.2, hemoglobin 10.2, hematocrit 34.6 Weight is 54.4 kg Blood pressure 127/110  Initial meeting with patient in the ED.  She states the day prior to admission she had not been feeling well and had not eaten anything all day long, she states that evening that her husband requested they go out to a seafood restaurant where she proceeded to have stuffed crab and hush puppies.  She states later at home she became nauseated and vomited along with the increasing shortness of breath.  She states that she had not noted a change in her weight and also had last been seen in the outpatient heart failure clinic on July 25.  Discussed low-sodium diet and not using salt at the table.  She states that she has been compliant with her medications and fluid restriction.  Discussed the ventricle health program, patient states that she does not have a smart phone.  She has follow-up in the outpatient heart failure clinic on August 28 at 3:30 in the afternoon.  Also discussed importance of fluid restriction, sticking with 64 ounces total daily.  She had no further questions.  Pricilla Riffle RN [facility]

## 2022-03-26 NOTE — ED Notes (Signed)
Pts O2 sats noted to be 89% on 2L.  Pt in no obvious distress and denies feeling sob.  Pt placed on 2L via [place] at this time.

## 2022-03-26 NOTE — Assessment & Plan Note (Signed)
-  Nicotine patch 

## 2022-03-26 NOTE — Assessment & Plan Note (Signed)
Patient has AV block, seems to be type I secondary AV block -Will not give nodal block - Not on anticoagulants due to history of ALH.

## 2022-03-26 NOTE — ED Provider Notes (Signed)
[facility] Provider Note    Event Date/Time   First MD Initiated Contact with Patient 03/25/22 2310     (approximate)   History   Nausea and Emesis   HPI  Dawn Sherman is a 66 y.o. female history of CHF, chronic kidney disease, hypertension, hyperlipidemia, tobacco use who presents to the emergency department with shortness of breath worse with exertion and lying flat.  Intermittently hypoxic here and does not wear oxygen at home.  No chest pain.  Has had nausea and vomiting.  No fever.  Nonproductive cough.  No lower extremity swelling or pain.  No abdominal pain.  No diarrhea.  No dysuria, hematuria, vaginal bleeding or discharge.  Has had hysterectomy   History provided by patient.    Past Medical History:  Diagnosis Date   B12 deficiency    CHF (congestive heart failure) ([place])    CKD (chronic kidney disease), stage III ([place])    Depression    Endometriosis    Hypertension    Mixed hyperlipidemia    Tobacco abuse     Past Surgical History:  Procedure Laterality Date   ABDOMINAL HYSTERECTOMY     BREAST BIOPSY Right 2015   neg-  FIBROADENOMA   TAH/RSO  1999   secondary to bleeding and endometriosis (Dr Vernie Ammons)    MEDICATIONS:  Prior to Admission medications   Medication Sig Start Date End Date Taking? Authorizing Provider  acetaminophen (TYLENOL) 325 MG tablet Take 650 mg by mouth every 6 (six) hours as needed.    [provider]  albuterol (VENTOLIN HFA) 108 (90 Base) MCG/ACT inhaler Inhale 2 puffs into the lungs every 6 (six) hours as needed for wheezing or shortness of breath. 03/14/22   Lucrezia Starch, MD  amLODipine (NORVASC) 10 MG tablet Take 1 tablet (10 mg total) by mouth daily. Patient taking differently: Take 5 mg by mouth daily. 12/14/21   Fritzi Mandes, MD  atorvastatin (LIPITOR) 40 MG tablet TAKE ONE TABLET BY MOUTH DAILY 02/27/22   [name], MD  buPROPion (WELLBUTRIN XL) 150 MG 24 hr tablet TAKE ONE TABLET BY  MOUTH DAILY 01/09/22   [name], MD  calcium carbonate (OS-CAL - DOSED IN MG OF ELEMENTAL CALCIUM) 1250 (500 Ca) MG tablet Take 1 tablet (500 mg of elemental calcium total) by mouth 3 (three) times daily with meals. 12/14/21   Fritzi Mandes, MD  FARXIGA 10 MG TABS tablet Take 10 mg by mouth daily. 11/20/21   [provider]  ferrous sulfate 325 (65 FE) MG tablet Take 1 tablet (325 mg total) by mouth 2 (two) times daily with a meal. 12/14/21   Fritzi Mandes, MD  Fluticasone-Umeclidin-Vilant (TRELEGY ELLIPTA) 100-62.5-25 MCG/ACT AEPB Inhale 1 puff into the lungs daily. 03/05/22   [name], MD  furosemide (LASIX) 40 MG tablet TAKE 1 TABLET BY MOUTH EVERY OTHER DAY 03/24/22   Dutch Quint B, FNP  hydrALAZINE (APRESOLINE) 25 MG tablet Take 1 tablet (25 mg total) by mouth 3 (three) times daily. 01/31/22   Alisa Graff, FNP  loratadine (CLARITIN) 10 MG tablet Take 10 mg by mouth daily.    [provider]  omeprazole (PRILOSEC) 20 MG capsule Take 20 mg by mouth daily.    [provider]  Iven Finn [phone] Daniel PACK  12/17/21   [provider]  sodium bicarbonate 650 MG tablet Take 1 tablet (650 mg total) by mouth 2 (two) times daily. 12/14/21   Fritzi Mandes, MD  venlafaxine  XR (EFFEXOR-XR) 150 MG 24 hr capsule TAKE 1 CAPSULE BY MOUTH EVERY DAY 11/27/21   [name], MD    Physical Exam   Triage Vital Signs: ED Triage Vitals  Enc Vitals Group     BP 03/25/22 1757 (!) 124/56     Pulse Rate 03/25/22 1757 67     Resp 03/25/22 1757 20     Temp 03/25/22 1757 98 F (36.7 C)     Temp Source 03/25/22 1757 Oral     SpO2 03/25/22 1757 94 %     Weight 03/25/22 1800 120 lb (54.4 kg)     Height 03/25/22 1800 5\' 5"  (1.651 m)     Head Circumference --      Peak Flow --      Pain Score 03/25/22 1759 0     Pain Loc --      Pain Edu? --      Excl. in [place]? --     Most recent vital signs: Vitals:   03/26/22 0000 03/26/22 0425  BP: 134/66   Pulse: 84   Resp: 18    Temp:  98.4 F (36.9 C)  SpO2: 94%     CONSTITUTIONAL: Alert and oriented and responds appropriately to questions. Well-appearing; well-nourished HEAD: Normocephalic, atraumatic EYES: Conjunctivae clear, pupils appear equal, sclera nonicteric ENT: normal nose; moist mucous membranes NECK: Supple, normal ROM CARD: RRR; S1 and S2 appreciated; no murmurs, no clicks, no rubs, no gallops RESP: Normal chest excursion without splinting or tachypnea; breath sounds clear and equal bilaterally; no wheezes, no rhonchi, no rales, intermittent hypoxia with rest and hypoxia with exertion, no respiratory distress, speaking full sentences ABD/GI: Normal bowel sounds; non-distended; soft, non-tender, no rebound, no guarding, no peritoneal signs BACK: The back appears normal EXT: Normal ROM in all joints; no deformity noted, no edema; no cyanosis, no calf tenderness or calf swelling SKIN: Normal color for age and race; warm; no rash on exposed skin NEURO: Moves all extremities equally, normal speech PSYCH: The patient's mood and manner are appropriate.   ED Results / Procedures / Treatments   LABS: (all labs ordered are listed, but only abnormal results are displayed) Labs Reviewed  COMPREHENSIVE METABOLIC PANEL - Abnormal; Notable for the following components:      Result Value   CO2 20 (*)    BUN 29 (*)    Creatinine, Ser 2.37 (*)    Calcium 8.8 (*)    GFR, Estimated 22 (*)    All other components within normal limits  CBC - Abnormal; Notable for the following components:   Hemoglobin 10.2 (*)    HCT 34.6 (*)    MCH 25.1 (*)    MCHC 29.5 (*)    RDW 21.3 (*)    All other components within normal limits  URINALYSIS, ROUTINE W REFLEX MICROSCOPIC - Abnormal; Notable for the following components:   Color, Urine YELLOW (*)    APPearance CLEAR (*)    Protein, ur >=300 (*)    All other components within normal limits  BRAIN NATRIURETIC PEPTIDE - Abnormal; Notable for the following components:    B Natriuretic Peptide 1,712.2 (*)    All other components within normal limits  TROPONIN I (HIGH SENSITIVITY) - Abnormal; Notable for the following components:   Troponin I (High Sensitivity) 37 (*)    All other components within normal limits  TROPONIN I (HIGH SENSITIVITY) - Abnormal; Notable for the following components:   Troponin I (High Sensitivity) 27 (*)  All other components within normal limits  LIPASE, BLOOD  MAGNESIUM     EKG:  EKG Interpretation  Date/Time:  Tuesday March 26 2022 00:14:53 EDT Ventricular Rate:  85 PR Interval:  369 QRS Duration: 84 QT Interval:  467 QTC Calculation: 556 R Axis:   58 Text Interpretation: Sinus rhythm Sinus pause Prolonged PR interval Anteroseptal infarct, age indeterminate Repol abnrm, severe global ischemia (LM/MVD) Prolonged QT interval No significant change since last tracing Confirmed by Pryor Curia [phone]) on 03/26/2022 12:34:38 AM         RADIOLOGY: My personal review and interpretation of imaging: Chest x-ray shows vascular congestion.  I have personally reviewed all radiology reports.   DG Chest 2 View  Result Date: 03/25/2022 CLINICAL DATA:  Shortness of breath. EXAM: CHEST - 2 VIEW COMPARISON:  Chest radiograph dated 03/14/2022. FINDINGS: There is diffuse chronic interstitial coarsening. Left mid lung field and right lung base linear atelectasis/scarring. No focal consolidation, pleural effusion, pneumothorax. There is mild cardiomegaly with probable mild central vascular congestion. There is degenerative changes of the spine and scoliosis. No acute osseous pathology. Old fracture of the left lateral chest wall with associated chest wall deformity and pleural thickening. IMPRESSION: Mild cardiomegaly with probable mild central vascular congestion. No focal consolidation. Electronically Signed   By: Anner Crete M.D.   On: 03/25/2022 23:29     PROCEDURES:  Critical Care performed: Yes, see critical care procedure  note(s)   CRITICAL CARE Performed by: Cyril Mourning Chesney Suares   Total critical care time: 45 minutes  Critical care time was exclusive of separately billable procedures and treating other patients.  Critical care was necessary to treat or prevent imminent or life-threatening deterioration.  Critical care was time spent personally by me on the following activities: development of treatment plan with patient and/or surrogate as well as nursing, discussions with consultants, evaluation of patient's response to treatment, examination of patient, obtaining history from patient or surrogate, ordering and performing treatments and interventions, ordering and review of laboratory studies, ordering and review of radiographic studies, pulse oximetry and re-evaluation of patient's condition.   Marland Kitchen1-3 Lead EKG Interpretation  Performed by: Liandra Mendia, Delice Bison, DO Authorized by: Jo Cerone, Delice Bison, DO     Interpretation: normal     ECG rate:  84   ECG rate assessment: normal     Rhythm: sinus rhythm     Ectopy: none     Conduction: normal       IMPRESSION / MDM / ASSESSMENT AND PLAN / ED COURSE  I reviewed the triage vital signs and the nursing notes.    Patient here with shortness of breath with exertion.  Found to be hypoxic here with no oxygen requirement at home.  The patient is on the cardiac monitor to evaluate for evidence of arrhythmia and/or significant heart rate changes.   DIFFERENTIAL DIAGNOSIS (includes but not limited to):   CHF, PE, dissection, pneumonia, pneumothorax   Patient's presentation is most consistent with acute presentation with potential threat to life or bodily function.   PLAN: We will obtain CBC, BMP, BNP, troponin x2, chest x-ray, EKG.  Has had some nausea and vomiting but abdominal exam benign.  Received Zofran in triage and states that is feeling much better.   MEDICATIONS GIVEN IN ED: Medications  furosemide (LASIX) injection 40 mg (40 mg Intravenous Given 03/26/22  0009)     ED COURSE: Patient's labs show stable hemoglobin and chronic kidney disease.  Troponin slightly elevated but downtrending and near  her baseline.  BNP 1700 up from 538 2 weeks ago.  Chest x-ray reviewed and interpreted by myself and the radiologist and shows vascular congestion and cardiomegaly.  She is hypoxic with ambulation to 86% on room air.  Currently 94% on room air when at rest.  Will admit to the hospital service for IV diuresis.  Received Lasix here.   CONSULTS:  Consulted and discussed patient's case with hospitalist, Dr. Sidney Ace.  I have recommended admission and consulting physician agrees and will place admission orders.  Patient (and family if present) agree with this plan.   I reviewed all nursing notes, vitals, pertinent previous records.  All labs, EKGs, imaging ordered have been independently reviewed and interpreted by myself.    OUTSIDE RECORDS REVIEWED: Reviewed patient's last nephrology note on 11/20/2021.       FINAL CLINICAL IMPRESSION(S) / ED DIAGNOSES   Final diagnoses:  Acute on chronic congestive heart failure, unspecified heart failure type ([place])  Acute respiratory failure with hypoxia ([place])     Rx / DC Orders   ED Discharge Orders     None        Note:  This document was prepared using Dragon voice recognition software and may include unintentional dictation errors.   Krishna Heuer, Delice Bison, DO 03/26/22 [phone]

## 2022-03-26 NOTE — Assessment & Plan Note (Signed)
Lipitor 

## 2022-03-27 DIAGNOSIS — I441 Atrioventricular block, second degree: Secondary | ICD-10-CM | POA: Diagnosis not present

## 2022-03-27 DIAGNOSIS — I5033 Acute on chronic diastolic (congestive) heart failure: Secondary | ICD-10-CM | POA: Diagnosis not present

## 2022-03-27 DIAGNOSIS — I480 Paroxysmal atrial fibrillation: Secondary | ICD-10-CM | POA: Diagnosis not present

## 2022-03-27 LAB — BASIC METABOLIC PANEL
Anion gap: 9 (ref 5–15)
BUN: 36 mg/dL — ABNORMAL HIGH (ref 8–23)
CO2: 22 mmol/L (ref 22–32)
Calcium: 9.1 mg/dL (ref 8.9–10.3)
Chloride: 109 mmol/L (ref 98–111)
Creatinine, Ser: 2.78 mg/dL — ABNORMAL HIGH (ref 0.44–1.00)
GFR, Estimated: 18 mL/min — ABNORMAL LOW (ref >60)
Glucose, Bld: 105 mg/dL — ABNORMAL HIGH (ref 70–99)
Potassium: 3.9 mmol/L (ref 3.5–5.1)
Sodium: 140 mmol/L (ref 135–145)

## 2022-03-27 LAB — LIPID PANEL
Cholesterol: 183 mg/dL (ref 0–200)
HDL: 87 mg/dL (ref >40)
LDL Cholesterol: 79 mg/dL (ref 0–99)
Total CHOL/HDL Ratio: 2.1 RATIO
Triglycerides: 83 mg/dL (ref <150)
VLDL: 17 mg/dL (ref 0–40)

## 2022-03-27 LAB — CBC
HCT: 33.8 % — ABNORMAL LOW (ref 36.0–46.0)
Hemoglobin: 10.3 g/dL — ABNORMAL LOW (ref 12.0–15.0)
MCH: 25.2 pg — ABNORMAL LOW (ref 26.0–34.0)
MCHC: 30.5 g/dL (ref 30.0–36.0)
MCV: 82.6 fL (ref 80.0–100.0)
Platelets: 258 10*3/uL (ref 150–400)
RBC: 4.09 MIL/uL (ref 3.87–5.11)
RDW: 21.3 % — ABNORMAL HIGH (ref 11.5–15.5)
WBC: 9.4 10*3/uL (ref 4.0–10.5)
nRBC: 0 % (ref 0.0–0.2)

## 2022-03-27 MED ORDER — FUROSEMIDE 40 MG PO TABS
40.0000 mg | ORAL_TABLET | Freq: Every day | ORAL | Status: DC
  Administered 2022-03-28: 40 mg via ORAL
  Filled 2022-03-27: qty 1

## 2022-03-27 MED ORDER — ENOXAPARIN SODIUM 30 MG/0.3ML IJ SOSY
30.0000 mg | PREFILLED_SYRINGE | INTRAMUSCULAR | Status: DC
  Administered 2022-03-27: 30 mg via SUBCUTANEOUS
  Filled 2022-03-27: qty 0.3

## 2022-03-27 NOTE — Progress Notes (Signed)
  PROGRESS NOTE    Dawn Sherman  YKZ:[id number] DOB: 1956/04/13 DOA: 03/25/2022 PCP: [name], MD  [id number]  LOS: 1 day   Brief hospital course: No notes on file  Assessment & Plan: Dawn Sherman is a 66 y.o. female with medical history significant of hypertension, hyperlipidemia, GERD, depression, tobacco abuse, alcohol abuse in remission, CKD-IV,  dCHF (used to have sCHF with EF 30-35%, improved to EF 65-70 by 2D Echo on 12/11/21), SDH, PAF not on Eliquis, second-degree AV block, presents with SOB.   * Acute on chronic diastolic CHF (congestive heart failure) ([place]) 2D echo on 12/11/21 showed EF of 65-70%.  Patient has positive JVD, fine crackles on auscultation, vascular congestion on chest x-ray, elevated BNP 1712, clinically consistent with CHF exacerbation.  Consulted Dr. Saunders Revel of cardiology. --received IV lasix 40 mg x4 with Cr increase Plan: --transition to oral lasix 40 mg daily tomorrow, per cardio    AKI (acute kidney injury) ([place]) --from diuresis  Myocardial injury-resolved as of 03/27/2022 Myocardial injury due to CHF exacerbation: Troponin level 37, 27.  No chest pain. -Lipitor -Will not start aspirin due to history of [place] on 12/16/2021. 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  Not all urine output charted but pt reported good urine output.   Objective: Vitals:   03/27/22 0415 03/27/22 0744 03/27/22 1157 03/27/22 1638  BP: 134/67 131/66 134/64 (!) 149/66  Pulse: 73 62 91 86  Resp: 17 19 16 18   Temp: 97.9 F (36.6 C) 98.4 F (36.9 C) 97.8 F (36.6 C) 97.9 F (36.6 C)  TempSrc: Oral Oral Oral   SpO2: 94% 99% 95% 95%  Weight: 54.5 kg     Height:        Intake/Output Summary (Last 24 hours) at 03/27/2022 1913 Last data filed at 03/27/2022 1825 Gross per 24 hour  Intake 480 ml  Output 1900 ml  Net -1420 ml   Filed Weights   03/25/22 1800 03/27/22 0415  Weight: 54.4 kg 54.5 kg    Examination:   Constitutional: NAD, AAOx3 HEENT: conjunctivae and lids normal, EOMI CV: No cyanosis.   RESP: normal respiratory effort, on RA SKIN: warm, dry Neuro: II - XII grossly intact.   Psych: Normal mood and affect.  Appropriate judgement and reason   Data Reviewed: I have personally reviewed labs and imaging studies  Time spent: 50 minutes  Enzo Bi, MD [facility] If 7PM-7AM, please contact night-coverage 03/27/2022, 7:13 PM

## 2022-03-27 NOTE — Assessment & Plan Note (Signed)
--  from diuresis

## 2022-03-27 NOTE — Progress Notes (Signed)
PHARMACIST - PHYSICIAN COMMUNICATION  CONCERNING:  Enoxaparin (Lovenox) for DVT Prophylaxis    RECOMMENDATION: Patient was prescribed enoxaprin 40mg  q24 hours for VTE prophylaxis.   Filed Weights   03/25/22 1800 03/27/22 0415  Weight: 54.4 kg (120 lb) 54.5 kg (120 lb 2.4 oz)    Body mass index is 19.99 kg/m.  Estimated Creatinine Clearance: 17.1 mL/min (A) (by C-G formula based on SCr of 2.78 mg/dL (H)).  Patient is candidate for enoxaparin 30mg  every 24 hours based on CrCl <47ml/min.  DESCRIPTION: Pharmacy has adjusted enoxaparin dose per [facility] policy.  Patient is now receiving enoxaparin 30 mg every 24 hours    Delena Bali, PharmD PGY-1 Pharmacy Resident  03/27/2022 7:17 PM

## 2022-03-27 NOTE — Progress Notes (Addendum)
Progress Note  Patient Name: Dawn Sherman Date of Encounter: 03/27/2022  [facility] Cardiologist: Ida Rogue, MD   Subjective   Shortness of breath improved, edema also improved.   Inpatient Medications    Scheduled Meds:  amLODipine  5 mg Oral Daily   atorvastatin  40 mg Oral Daily   calcium carbonate  1 tablet Oral TID WC   ferrous sulfate  325 mg Oral BID WC   fluticasone furoate-vilanterol  1 puff Inhalation Daily   And   umeclidinium bromide  1 puff Inhalation Daily   [START ON 03/28/2022] furosemide  40 mg Oral Daily   hydrALAZINE  25 mg Oral TID   loratadine  10 mg Oral Daily   nicotine  21 mg Transdermal Daily   pantoprazole  40 mg Oral Daily   Continuous Infusions:  PRN Meds: acetaminophen **OR** acetaminophen, albuterol, dextromethorphan-guaiFENesin, diphenhydrAMINE, hydrALAZINE, magnesium hydroxide, traZODone   Vital Signs    Vitals:   03/26/22 2328 03/27/22 0415 03/27/22 0744 03/27/22 1157  BP: 126/69 134/67 131/66 134/64  Pulse: 97 73 62 91  Resp: 16 17 19 16   Temp: 98 F (36.7 C) 97.9 F (36.6 C) 98.4 F (36.9 C) 97.8 F (36.6 C)  TempSrc: Oral Oral Oral Oral  SpO2: 94% 94% 99% 95%  Weight:  54.5 kg    Height:        Intake/Output Summary (Last 24 hours) at 03/27/2022 1204 Last data filed at 03/27/2022 0900 Gross per 24 hour  Intake 240 ml  Output 1300 ml  Net -1060 ml      03/27/2022    4:15 AM 03/25/2022    6:00 PM 03/19/2022   12:49 PM  Last 3 Weights  Weight (lbs) 120 lb 2.4 oz 120 lb 121 lb 6 oz  Weight (kg) 54.5 kg 54.432 kg 55.055 kg      Telemetry    Atrial fibrillation, heart rate 60- Personally Reviewed  ECG     - Personally Reviewed  Physical Exam   GEN: No acute distress.   Neck: No JVD Cardiac: Irregular irregular Respiratory: Clear to auscultation bilaterally. GI: Soft, nontender, non-distended  MS: No edema; No deformity. Neuro:  Nonfocal  Psych: Normal affect   Labs    High Sensitivity Troponin:    Recent Labs  Lab 03/14/22 1505 03/14/22 1924 03/25/22 1802 03/26/22 0115  TROPONINIHS 21* 23* 37* 27*     Chemistry Recent Labs  Lab 03/25/22 1802 03/27/22 0418  NA 140 140  K 4.8 3.9  CL 110 109  CO2 20* 22  GLUCOSE 92 105*  BUN 29* 36*  CREATININE 2.37* 2.78*  CALCIUM 8.8* 9.1  MG 2.2  --   PROT 6.8  --   ALBUMIN 3.9  --   AST 19  --   ALT 16  --   ALKPHOS 113  --   BILITOT 0.7  --   GFRNONAA 22* 18*  ANIONGAP 10 9    Lipids  Recent Labs  Lab 03/27/22 0418  CHOL 183  TRIG 83  HDL 87  LDLCALC 79  CHOLHDL 2.1    Hematology Recent Labs  Lab 03/25/22 1802 03/27/22 0418  WBC 9.9 9.4  RBC 4.06 4.09  HGB 10.2* 10.3*  HCT 34.6* 33.8*  MCV 85.2 82.6  MCH 25.1* 25.2*  MCHC 29.5* 30.5  RDW 21.3* 21.3*  PLT 323 258   Thyroid No results for input(s): "TSH", "FREET4" in the last 168 hours.  BNP Recent Labs  Lab  03/25/22 1802  BNP 1,712.2*    DDimer No results for input(s): "DDIMER" in the last 168 hours.   Radiology    DG Chest 2 View  Result Date: 03/25/2022 CLINICAL DATA:  Shortness of breath. EXAM: CHEST - 2 VIEW COMPARISON:  Chest radiograph dated 03/14/2022. FINDINGS: There is diffuse chronic interstitial coarsening. Left mid lung field and right lung base linear atelectasis/scarring. No focal consolidation, pleural effusion, pneumothorax. There is mild cardiomegaly with probable mild central vascular congestion. There is degenerative changes of the spine and scoliosis. No acute osseous pathology. Old fracture of the left lateral chest wall with associated chest wall deformity and pleural thickening. IMPRESSION: Mild cardiomegaly with probable mild central vascular congestion. No focal consolidation. Electronically Signed   By: Anner Crete M.D.   On: 03/25/2022 23:29    Cardiac Studies   TTE 11/2021 1. Left ventricular ejection fraction, by estimation, is 65 to 70%. The  left ventricle has normal function. The left ventricle has no regional   wall motion abnormalities. There is mild left ventricular hypertrophy.  Left ventricular diastolic parameters  are indeterminate.   2. Right ventricular systolic function is normal. The right ventricular  size is normal. There is normal pulmonary artery systolic pressure.   3. Left atrial size was mildly dilated.   4. The mitral valve is degenerative. Mild mitral valve regurgitation. No  evidence of mitral stenosis.   5. The aortic valve has an indeterminant number of cusps. There is mild  thickening of the aortic valve. Aortic valve regurgitation is not  visualized. Aortic valve sclerosis is present, with no evidence of aortic  valve stenosis.   6. The inferior vena cava is normal in size with greater than 50%  respiratory variability, suggesting right atrial pressure of 3 mmHg.   Patient Profile     66 y.o. female with history of HFpEF, paroxysmal atrial fibrillation, subarachnoid hemorrhage, CKD who presents due to nausea, vomiting, shortness of breath, being seen for HFpEF.  Assessment & Plan    HFpEF -Appears euvolemic -Creatinine elevated today, stop IV Lasix -Start p.o. Lasix 40 mg daily -Monitor creatinine  2.  Hypertension -BP controlled,  -Continue Norvasc, lisinopril.  3. [place] 1 av block -Continue to avoid AV nodal agents.  4.  Paroxysmal atrial fibrillation -Heart rate controlled off AV nodal agents -Avoiding anticoagulation due to history of subarachnoid hemorrhage -Consider anticoagulation after neurosurgery appointment  Total encounter time more than 50 minutes  Greater than 50% was spent in counseling and coordination of care with the patient       Signed, Kate Sable, MD  03/27/2022, 12:04 PM

## 2022-03-27 NOTE — TOC Initial Note (Signed)
Transition of Care [facility]) - Initial/Assessment Note    Patient Details  Name: Dawn Sherman MRN: [id number] Date of Birth: 1955/11/05  Transition of Care [facility]) CM/SW Contact:    Laurena Slimmer, RN Phone Number: 03/27/2022, 3:40 PM  Clinical Narrative:                  Transition of Care [facility]) Screening Note   Patient Details  Name: Dawn Sherman Date of Birth: 24-Dec-1955   Transition of Care [facility]) CM/SW Contact:    Laurena Slimmer, RN Phone Number: 03/27/2022, 3:40 PM    Transition of Care Department [facility]) has reviewed patient and no TOC needs have been identified at this time. We will continue to monitor patient advancement through interdisciplinary progression rounds. If new patient transition needs arise, please place a TOC consult.           Patient Goals and CMS Choice        Expected Discharge Plan and Services                                                Prior Living Arrangements/Services                       Activities of Daily Living Home Assistive Devices/Equipment: None ADL Screening (condition at time of admission) Patient's cognitive ability adequate to safely complete daily activities?: Yes Is the patient deaf or have difficulty hearing?: No Does the patient have difficulty seeing, even when wearing glasses/contacts?: No Does the patient have difficulty concentrating, remembering, or making decisions?: No Patient able to express need for assistance with ADLs?: Yes Does the patient have difficulty dressing or bathing?: No Independently performs ADLs?: Yes (appropriate for developmental age) Does the patient have difficulty walking or climbing stairs?: No Weakness of Legs: None Weakness of Arms/Hands: None  Permission Sought/Granted                  Emotional Assessment              Admission diagnosis:  Acute CHF (congestive heart failure) ([place]) [I50.9] Acute respiratory failure with hypoxia ([place])  [J96.01] Acute on chronic congestive heart failure, unspecified heart failure type ([place]) [I50.9] Patient Active Problem List   Diagnosis Date Noted   Acute on chronic diastolic CHF (congestive heart failure) ([place]) 03/26/2022   Myocardial injury 03/26/2022   CKD (chronic kidney disease), stage IV ([place]) 03/26/2022   Iron deficiency anemia 03/26/2022   Generalized weakness 12/17/2021   Essential hypertension 12/17/2021   Dyslipidemia 12/17/2021   Depression, major, single episode, complete remission ([place]) 12/17/2021   GERD without esophagitis 12/17/2021   Chronic diastolic CHF (congestive heart failure) ([place]) 12/17/2021   AKI (acute kidney injury) ([place]) 12/16/2021   Second degree AV block    Syncope and collapse 12/10/2021   Subarachnoid hemorrhage ([place]) 12/10/2021   Hypokalemia 12/10/2021   Hyponatremia 12/10/2021   Acute ITP ([place]) 12/10/2021   Atrial fibrillation with slow ventricular response ([place]) 12/10/2021   Hypotension 12/10/2021   Swelling of lower extremity 10/28/2021   Malnutrition of moderate degree 08/25/2021   Prediabetes 08/19/2021   Chronic obstructive pulmonary disease ([place]) 11/22/2020   CKD (chronic kidney disease) stage 3, GFR 30-59 ml/min (HCC) 08/12/2020   Paroxysmal A-fib ([place]) 08/12/2020   Dehydration    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[place])  Acute on chronic combined systolic and diastolic CHF (congestive heart failure) ([place]) 02/10/2020   HLD (hyperlipidemia) 02/10/2020   Elevated troponin 02/10/2020   GERD (gastroesophageal reflux disease) 02/10/2020   Abnormal mammogram 07/13/2019   Sleep concern 10/17/2018   History of alcohol abuse 07/31/2018   B12 deficiency 07/31/2018   Acute renal failure superimposed on stage 3b chronic kidney disease ([place]) 11/03/2016   Health care maintenance 12/02/2014   Tobacco abuse 12/02/2014   Foot pain, left 11/29/2014   Hypercholesterolemia 11/29/2014   Encounter for screening colonoscopy 12/16/2013   Breast mass  12/15/2012   Hypertension 07/05/2012   Depression, major, single episode, mild ([place]) 07/05/2012   PCP:  [name], MD Pharmacy:   [facility] [place], [place]  [address] Phone: [phone] Fax: [phone]     Social Determinants of Health (SDOH) Interventions    Readmission Risk Interventions    12/11/2021    3:17 PM  Readmission Risk Prevention Plan  Transportation Screening Complete  PCP or Specialist Appt within 3-5 Days Complete  HRI or [place] Complete  Social Work Consult for [place] Planning/Counseling Complete  Palliative Care Screening Not Applicable  Medication Review Press [employer]) Complete

## 2022-03-28 DIAGNOSIS — J9601 Acute respiratory failure with hypoxia: Secondary | ICD-10-CM

## 2022-03-28 DIAGNOSIS — I5033 Acute on chronic diastolic (congestive) heart failure: Secondary | ICD-10-CM | POA: Diagnosis not present

## 2022-03-28 DIAGNOSIS — D509 Iron deficiency anemia, unspecified: Secondary | ICD-10-CM

## 2022-03-28 DIAGNOSIS — J432 Centrilobular emphysema: Secondary | ICD-10-CM | POA: Diagnosis not present

## 2022-03-28 DIAGNOSIS — Z8679 Personal history of other diseases of the circulatory system: Secondary | ICD-10-CM | POA: Diagnosis not present

## 2022-03-28 LAB — CBC
HCT: 34.2 % — ABNORMAL LOW (ref 36.0–46.0)
Hemoglobin: 10.8 g/dL — ABNORMAL LOW (ref 12.0–15.0)
MCH: 25.9 pg — ABNORMAL LOW (ref 26.0–34.0)
MCHC: 31.6 g/dL (ref 30.0–36.0)
MCV: 82 fL (ref 80.0–100.0)
Platelets: 253 10*3/uL (ref 150–400)
RBC: 4.17 MIL/uL (ref 3.87–5.11)
RDW: 21.9 % — ABNORMAL HIGH (ref 11.5–15.5)
WBC: 8.7 10*3/uL (ref 4.0–10.5)
nRBC: 0 % (ref 0.0–0.2)

## 2022-03-28 LAB — BASIC METABOLIC PANEL
Anion gap: 7 (ref 5–15)
BUN: 40 mg/dL — ABNORMAL HIGH (ref 8–23)
CO2: 22 mmol/L (ref 22–32)
Calcium: 9.1 mg/dL (ref 8.9–10.3)
Chloride: 108 mmol/L (ref 98–111)
Creatinine, Ser: 2.44 mg/dL — ABNORMAL HIGH (ref 0.44–1.00)
GFR, Estimated: 21 mL/min — ABNORMAL LOW (ref >60)
Glucose, Bld: 99 mg/dL (ref 70–99)
Potassium: 4.1 mmol/L (ref 3.5–5.1)
Sodium: 137 mmol/L (ref 135–145)

## 2022-03-28 LAB — MAGNESIUM: Magnesium: 2.1 mg/dL (ref 1.7–2.4)

## 2022-03-28 MED ORDER — AMLODIPINE BESYLATE 10 MG PO TABS: 5.0000 mg | ORAL_TABLET | Freq: Every day | ORAL | Status: DC

## 2022-03-28 MED ORDER — NICOTINE 21 MG/24HR TD PT24: 21.0000 mg | MEDICATED_PATCH | Freq: Every day | TRANSDERMAL | 0 refills | Status: DC

## 2022-03-28 MED ORDER — FUROSEMIDE 40 MG PO TABS: 40.0000 mg | ORAL_TABLET | Freq: Every day | ORAL | 2 refills | Status: DC

## 2022-03-28 NOTE — Discharge Summary (Signed)
Physician Discharge Summary   Dawn Sherman  female DOB: [birth year]  UDJ:[id number]  PCP: [name], MD  Admit date: 03/25/2022 Discharge date: 03/28/2022  Admitted From: home Disposition:  home CODE STATUS: Full code  Discharge Instructions     Diet - low sodium heart healthy   Complete by: As directed       Hospital Course:  For full details, please see H&P, progress notes, consult notes and ancillary notes.  Briefly,  Dawn Sherman is a 66 y.o. female with medical history significant of hypertension, depression, tobacco abuse, alcohol abuse in remission, CKD-IV,  dCHF (used to have sCHF with EF 30-35%, improved to EF 65-70 by 2D Echo on 12/11/21), SDH, PAF not on Eliquis, second-degree AV block, presented with SOB.   * Acute on chronic diastolic CHF (congestive heart failure) ([place]) 2D echo on 12/11/21 showed EF of 65-70%.  Patient has positive JVD, fine crackles on auscultation, vascular congestion on chest x-ray, elevated BNP 1712, clinically consistent with CHF exacerbation.  Consulted Dr. Saunders Revel of cardiology. --received IV lasix 40 mg x4 with Cr bump, so transitioned to oral lasix 40 mg daily. --discharged on oral lasix 40 mg daily    AKI (acute kidney injury) ([place]) --from diuresis.     Mild troponin elevation 2/2 Demand ischemia   Paroxysmal A-fib (HCC) -Heart rate controlled off AV nodal agents -Avoiding anticoagulation due to history of subarachnoid hemorrhage   CKD (chronic kidney disease), stage IV (HCC) --Cr increased with diuresis   Hypertension -Continue Norvasc, hydralazine --cont lasix   HLD (hyperlipidemia) - Lipitor   Chronic obstructive pulmonary disease (HCC) - cont home daily Bronchodilators   Tobacco abuse - Nicotine patch   History of subarachnoid hemorrhage - Avoid using anticoagulants   Second degree AV block -Avoid using nodal blocker   Iron deficiency anemia - Continue iron supplement   Myocardial injury, ruled  out  Unless noted above, medications under "STOP" list are ones pt was not taking PTA.  Discharge Diagnoses:  Principal Problem:   Acute on chronic diastolic CHF (congestive heart failure) (HCC) Active Problems:   AKI (acute kidney injury) ([place])   Paroxysmal A-fib (HCC)   CKD (chronic kidney disease), stage IV (HCC)   Hypertension   HLD (hyperlipidemia)   Tobacco abuse   Chronic obstructive pulmonary disease (HCC)   History of subarachnoid hemorrhage   Second degree AV block   Iron deficiency anemia   30 Day Unplanned Readmission Risk Score    Flowsheet Row ED to Hosp-Admission (Current) from 03/25/2022 in [place] [facility] PCU  30 Day Unplanned Readmission Risk Score (%) 26.31 Filed at 03/28/2022 0801       This score is the patient's risk of an unplanned readmission within 30 days of being discharged (0 -100%). The score is based on dignosis, age, lab data, medications, orders, and past utilization.   Low:  0-14.9   Medium: 15-21.9   High: 22-29.9   Extreme: 30 and above         Discharge Instructions:  Allergies as of 03/28/2022       Reactions   Sulfate Rash   Codeine Sulfate Nausea Only   Benicar [olmesartan]    Talked with patient February 10, 2020, intolerance is unclear, tried several medications around that time and one of them gave her a rash but she is not clear which 1.   Amoxicillin Rash   Clindamycin/lincomycin Rash   Morphine And Related Rash   Penicillins Rash  Medication List     STOP taking these medications    Phos-NaK 280-160-250 MG Pack Generic drug: potassium & sodium phosphates   sodium bicarbonate 650 MG tablet       TAKE these medications    acetaminophen 325 MG tablet Commonly known as: TYLENOL Take 650 mg by mouth every 6 (six) hours as needed.   albuterol 108 (90 Base) MCG/ACT inhaler Commonly known as: VENTOLIN HFA Inhale 2 puffs into the lungs every 6 (six) hours as needed for wheezing or shortness of  breath.   amLODipine 10 MG tablet Commonly known as: NORVASC Take 0.5 tablets (5 mg total) by mouth daily. Home med. What changed:  how much to take additional instructions   atorvastatin 40 MG tablet Commonly known as: LIPITOR TAKE ONE TABLET BY MOUTH DAILY   buPROPion 150 MG 24 hr tablet Commonly known as: WELLBUTRIN XL TAKE ONE TABLET BY MOUTH DAILY   calcium carbonate 1250 (500 Ca) MG tablet Commonly known as: OS-CAL - dosed in mg of elemental calcium Take 1 tablet (500 mg of elemental calcium total) by mouth 3 (three) times daily with meals.   Farxiga 10 MG Tabs tablet Generic drug: dapagliflozin propanediol Take 10 mg by mouth daily.   ferrous sulfate 325 (65 FE) MG tablet Take 1 tablet (325 mg total) by mouth 2 (two) times daily with a meal.   furosemide 40 MG tablet Commonly known as: LASIX Take 1 tablet (40 mg total) by mouth daily. What changed: when to take this   hydrALAZINE 25 MG tablet Commonly known as: APRESOLINE Take 1 tablet (25 mg total) by mouth 3 (three) times daily.   loratadine 10 MG tablet Commonly known as: CLARITIN Take 10 mg by mouth daily.   nicotine 21 mg/24hr patch Commonly known as: NICODERM CQ - dosed in mg/24 hours Place 1 patch (21 mg total) onto the skin daily. Start taking on: March 29, 2022   omeprazole 20 MG capsule Commonly known as: PRILOSEC Take 20 mg by mouth daily.   Trelegy Ellipta 100-62.5-25 MCG/ACT Aepb Generic drug: Fluticasone-Umeclidin-Vilant Inhale 1 puff into the lungs daily.   venlafaxine XR 150 MG 24 hr capsule Commonly known as: EFFEXOR-XR TAKE 1 CAPSULE BY MOUTH EVERY DAY         Follow-up Information     Minna Merritts, MD Follow up in 1 week(s).   Specialty: Cardiology Contact information: [address] [place] [address] [phone]                 Allergies  Allergen Reactions   Sulfate Rash   Codeine Sulfate Nausea Only   Benicar [Olmesartan]      Talked with patient February 10, 2020, intolerance is unclear, tried several medications around that time and one of them gave her a rash but she is not clear which 1.   Amoxicillin Rash   Clindamycin/Lincomycin Rash   Morphine And Related Rash   Penicillins Rash     The results of significant diagnostics from this hospitalization (including imaging, microbiology, ancillary and laboratory) are listed below for reference.   Consultations:   Procedures/Studies: DG Chest 2 View  Result Date: 03/25/2022 CLINICAL DATA:  Shortness of breath. EXAM: CHEST - 2 VIEW COMPARISON:  Chest radiograph dated 03/14/2022. FINDINGS: There is diffuse chronic interstitial coarsening. Left mid lung field and right lung base linear atelectasis/scarring. No focal consolidation, pleural effusion, pneumothorax. There is mild cardiomegaly with probable mild central vascular congestion. There is degenerative  changes of the spine and scoliosis. No acute osseous pathology. Old fracture of the left lateral chest wall with associated chest wall deformity and pleural thickening. IMPRESSION: Mild cardiomegaly with probable mild central vascular congestion. No focal consolidation. Electronically Signed   By: Anner Crete M.D.   On: 03/25/2022 23:29   DG Chest 2 View  Result Date: 03/14/2022 CLINICAL DATA:  Shortness of breath EXAM: CHEST - 2 VIEW COMPARISON:  12/10/2021 FINDINGS: Heart is normal size. Linear scarring or atelectasis in the left upper lobe. Right lung clear. No effusions. Severe thoracolumbar scoliosis. No acute bony abnormality. IMPRESSION: No active cardiopulmonary disease. Electronically Signed   By: Rolm Baptise M.D.   On: 03/14/2022 15:54   MR BRAIN WO CONTRAST  Result Date: 03/08/2022 CLINICAL DATA:  Provided history: Subarachnoid hemorrhage. EXAM: MRI HEAD WITHOUT CONTRAST TECHNIQUE: Multiplanar, multiecho pulse sequences of the brain and surrounding structures were obtained without intravenous contrast.  COMPARISON:  Prior head CT examinations 12/16/2021 and earlier. MRA head 12/10/2021. FINDINGS: Intermittently motion degraded examination. Most notably, there is moderate to moderately severe motion degradation of the axial T2 FLAIR sequence. Brain: No age advanced or lobar predominant parenchymal atrophy. Advanced multifocal T2 FLAIR hyperintense signal abnormality within the cerebral white matter and pons, nonspecific but compatible chronic small vessel ischemic disease. There is no acute infarct. No evidence of an intracranial mass. No appreciable chronic intracranial blood products. No extra-axial fluid collection. No midline shift. Vascular: Maintained flow voids within the proximal large arterial vessels. Skull and upper cervical spine: No focal suspicious marrow lesion. Incompletely assessed cervical spondylosis. Sinuses/Orbits: No mass or acute finding within the imaged orbits. No significant paranasal sinus disease. IMPRESSION: 1. Intermittently motion degraded examination, as described. 2. No evidence of acute intracranial abnormality. 3. Advanced chronic small vessel ischemic changes within the cerebral white matter and pons. Electronically Signed   By: Kellie Simmering D.O.   On: 03/08/2022 07:52      Labs: BNP (last 3 results) Recent Labs    12/10/21 1606 03/14/22 1505 03/25/22 1802  BNP 41.5 538.3* 7,062.3*   Basic Metabolic Panel: Recent Labs  Lab 03/25/22 1802 03/27/22 0418 03/28/22 0431  NA 140 140 137  K 4.8 3.9 4.1  CL 110 109 108  CO2 20* 22 22  GLUCOSE 92 105* 99  BUN 29* 36* 40*  CREATININE 2.37* 2.78* 2.44*  CALCIUM 8.8* 9.1 9.1  MG 2.2  --  2.1   Liver Function Tests: Recent Labs  Lab 03/25/22 1802  AST 19  ALT 16  ALKPHOS 113  BILITOT 0.7  PROT 6.8  ALBUMIN 3.9   Recent Labs  Lab 03/25/22 1802  LIPASE 21   No results for input(s): "AMMONIA" in the last 168 hours. CBC: Recent Labs  Lab 03/25/22 1802 03/27/22 0418 03/28/22 0431  WBC 9.9 9.4 8.7   HGB 10.2* 10.3* 10.8*  HCT 34.6* 33.8* 34.2*  MCV 85.2 82.6 82.0  PLT 323 258 253   Cardiac Enzymes: No results for input(s): "CKTOTAL", "CKMB", "CKMBINDEX", "TROPONINI" in the last 168 hours. BNP: Invalid input(s): "POCBNP" CBG: No results for input(s): "GLUCAP" in the last 168 hours. D-Dimer No results for input(s): "DDIMER" in the last 72 hours. Hgb A1c No results for input(s): "HGBA1C" in the last 72 hours. Lipid Profile Recent Labs    03/27/22 0418  CHOL 183  HDL 87  LDLCALC 79  TRIG 83  CHOLHDL 2.1   Thyroid function studies No results for input(s): "TSH", "T4TOTAL", "T3FREE", "THYROIDAB" in  the last 72 hours.  Invalid input(s): "FREET3" Anemia work up No results for input(s): "VITAMINB12", "FOLATE", "FERRITIN", "TIBC", "IRON", "RETICCTPCT" in the last 72 hours. Urinalysis    Component Value Date/Time   COLORURINE YELLOW (A) 03/25/2022 2210   APPEARANCEUR CLEAR (A) 03/25/2022 2210   LABSPEC 1.016 03/25/2022 2210   PHURINE 5.0 03/25/2022 2210   GLUCOSEU NEGATIVE 03/25/2022 2210   GLUCOSEU NEGATIVE 01/22/2017 1052   HGBUR NEGATIVE 03/25/2022 2210   BILIRUBINUR NEGATIVE 03/25/2022 2210   KETONESUR NEGATIVE 03/25/2022 2210   PROTEINUR >=300 (A) 03/25/2022 2210   UROBILINOGEN 0.2 01/22/2017 1052   NITRITE NEGATIVE 03/25/2022 2210   LEUKOCYTESUR NEGATIVE 03/25/2022 2210   Sepsis Labs Recent Labs  Lab 03/25/22 1802 03/27/22 0418 03/28/22 0431  WBC 9.9 9.4 8.7   Microbiology No results found for this or any previous visit (from the past 240 hour(s)).   Total time spend on discharging this patient, including the last patient exam, discussing the hospital stay, instructions for ongoing care as it relates to all pertinent caregivers, as well as preparing the medical discharge records, prescriptions, and/or referrals as applicable, is 35 minutes.    Enzo Bi, MD  [facility] 03/28/2022, 10:09 AM

## 2022-03-28 NOTE — Progress Notes (Signed)
Progress Note  Patient Name: Dawn Sherman Date of Encounter: 03/28/2022  [facility] Cardiologist: Ida Rogue, MD   Subjective   Patient seen on AM rounds. Denies any chest pain or shortness of breath. -420 mls output in the last 24 hours. Serum creatinine 2.44, slightly improved.   Inpatient Medications    Scheduled Meds:  amLODipine  5 mg Oral Daily   atorvastatin  40 mg Oral Daily   calcium carbonate  1 tablet Oral TID WC   enoxaparin (LOVENOX) injection  30 mg Subcutaneous Q24H   ferrous sulfate  325 mg Oral BID WC   fluticasone furoate-vilanterol  1 puff Inhalation Daily   And   umeclidinium bromide  1 puff Inhalation Daily   furosemide  40 mg Oral Daily   hydrALAZINE  25 mg Oral TID   loratadine  10 mg Oral Daily   nicotine  21 mg Transdermal Daily   pantoprazole  40 mg Oral Daily   Continuous Infusions:  PRN Meds: acetaminophen **OR** acetaminophen, albuterol, dextromethorphan-guaiFENesin, diphenhydrAMINE, hydrALAZINE, magnesium hydroxide, traZODone   Vital Signs    Vitals:   03/27/22 1638 03/27/22 2031 03/27/22 2255 03/28/22 0323  BP: (!) 149/66 (!) 152/77 (!) 162/74 139/64  Pulse: 86 87 96 69  Resp: 18 19 18 19   Temp: 97.9 F (36.6 C) 98.2 F (36.8 C) 98 F (36.7 C) 98.2 F (36.8 C)  TempSrc:   Oral Oral  SpO2: 95% 97% 96% 100%  Weight:    53 kg  Height:        Intake/Output Summary (Last 24 hours) at 03/28/2022 0745 Last data filed at 03/27/2022 1825 Gross per 24 hour  Intake 480 ml  Output 900 ml  Net -420 ml      03/28/2022    3:23 AM 03/27/2022    4:15 AM 03/25/2022    6:00 PM  Last 3 Weights  Weight (lbs) 116 lb 12.8 oz 120 lb 2.4 oz 120 lb  Weight (kg) 52.98 kg 54.5 kg 54.432 kg      Telemetry    Sinus rate of 70-80 with first degree AVB - Personally Reviewed  ECG    No new tracings - Personally Reviewed  Physical Exam   GEN: No acute distress.   Neck: No JVD Cardiac: RRR, no murmurs, rubs, or gallops.  Respiratory:  Clear to auscultation bilaterally. Respirations are unlabored on room air.  GI: Soft, nontender, non-distended  MS: No edema; No deformity. Neuro:  Nonfocal  Psych: Normal affect   Labs    High Sensitivity Troponin:   Recent Labs  Lab 03/14/22 1505 03/14/22 1924 03/25/22 1802 03/26/22 0115  TROPONINIHS 21* 23* 37* 27*     Chemistry Recent Labs  Lab 03/25/22 1802 03/27/22 0418 03/28/22 0431  NA 140 140 137  K 4.8 3.9 4.1  CL 110 109 108  CO2 20* 22 22  GLUCOSE 92 105* 99  BUN 29* 36* 40*  CREATININE 2.37* 2.78* 2.44*  CALCIUM 8.8* 9.1 9.1  MG 2.2  --  2.1  PROT 6.8  --   --   ALBUMIN 3.9  --   --   AST 19  --   --   ALT 16  --   --   ALKPHOS 113  --   --   BILITOT 0.7  --   --   GFRNONAA 22* 18* 21*  ANIONGAP 10 9 7     Lipids  Recent Labs  Lab 03/27/22 0418  CHOL 183  TRIG 83  HDL 87  LDLCALC 79  CHOLHDL 2.1    Hematology Recent Labs  Lab 03/25/22 1802 03/27/22 0418 03/28/22 0431  WBC 9.9 9.4 8.7  RBC 4.06 4.09 4.17  HGB 10.2* 10.3* 10.8*  HCT 34.6* 33.8* 34.2*  MCV 85.2 82.6 82.0  MCH 25.1* 25.2* 25.9*  MCHC 29.5* 30.5 31.6  RDW 21.3* 21.3* 21.9*  PLT 323 258 253   Thyroid No results for input(s): "TSH", "FREET4" in the last 168 hours.  BNP Recent Labs  Lab 03/25/22 1802  BNP 1,712.2*    DDimer No results for input(s): "DDIMER" in the last 168 hours.   Radiology    No results found.  Cardiac Studies  Echocardiogram 12/11/2021 1. Left ventricular ejection fraction, by estimation, is 65 to 70%. The  left ventricle has normal function. The left ventricle has no regional  wall motion abnormalities. There is mild left ventricular hypertrophy.  Left ventricular diastolic parameters  are indeterminate.   2. Right ventricular systolic function is normal. The right ventricular  size is normal. There is normal pulmonary artery systolic pressure.   3. Left atrial size was mildly dilated.   4. The mitral valve is degenerative. Mild mitral  valve regurgitation. No  evidence of mitral stenosis.   5. The aortic valve has an indeterminant number of cusps. There is mild  thickening of the aortic valve. Aortic valve regurgitation is not  visualized. Aortic valve sclerosis is present, with no evidence of aortic  valve stenosis.   6. The inferior vena cava is normal in size with greater than 50%  respiratory variability, suggesting right atrial pressure of 3 mmHg.   Patient Profile     66 y.o. female with a history of HFpEF, paroxysmal atrial fibrillation not on anticoagulation, SAH, CKD , HTN, CHF, who is being seen and evaluated for CHF exacerbation.   Assessment & Plan    HFpEF -appears euvolemic on exam -breathing has improved  -IV lasix stopped with elevated creatinine -continued on oral lasix with slight improvement in creatinine -daily bmp -daily weight, I&O, low sodium diet  Hypertension -blood pressure 140/91 -continue amlodipine and hydralazine  -vitals per unit protocol  Mobitz type 1 AVB -continue to avoid AV nodal agents -stable  Paroxysmal atrial fibrillation -currently sinus -previously was on eliquis until [facility], was on hold until advised by neurosurgery if and when to restart  5.   HLD -ldl 85 02/28/22 -continue atorvastatin 40 mg daily  6.    Acute on chronic CKD III -serum creatinine 2.44 -improved from yesterday with results of 2.78 -baseline serum creatinine 1.8-2 -daily bmp -avoid nephrotoxic medications      For questions or updates, please contact [place] Please consult [email or web address] for contact info under        Signed, Eula Jaster, NP  03/28/2022, 7:45 AM

## 2022-03-29 ENCOUNTER — Inpatient Hospital Stay: Payer: Medicare Other | Attending: Oncology

## 2022-03-29 ENCOUNTER — Telehealth: Payer: Self-pay | Admitting: *Deleted

## 2022-03-29 ENCOUNTER — Encounter: Payer: Self-pay | Admitting: *Deleted

## 2022-03-29 ENCOUNTER — Telehealth: Payer: Self-pay

## 2022-03-29 NOTE — Telephone Encounter (Signed)
Transition Care Management Unsuccessful Follow-up Telephone Call  Date of discharge and from where:  03/28/22 [facility]  Attempts:  1st Attempt  Reason for unsuccessful TCM follow-up call:  Unable to reach patient

## 2022-03-29 NOTE — Patient Outreach (Signed)
  Care Coordination [facility] Note Transition Care Management Unsuccessful Follow-up Telephone Call  Date of discharge and from where:  03/28/22 [facility]  Attempts:  1st Attempt  Reason for unsuccessful TCM follow-up call:  Left voice message  Oneta Rack, RN, BSN, [place] RN [place] Management [phone]: direct office

## 2022-04-01 ENCOUNTER — Encounter: Payer: Self-pay | Admitting: *Deleted

## 2022-04-01 ENCOUNTER — Telehealth: Payer: Self-pay | Admitting: *Deleted

## 2022-04-01 NOTE — Telephone Encounter (Signed)
Patient reports she has hospital follow up scheduled with Cardiology 04/08/22. Notes she is doing well post discharge. Agrees to keep previously scheduled office visit with PCP next month and call as needed. Closed attempts for  TCM/TOC at this time.

## 2022-04-01 NOTE — Patient Outreach (Signed)
  Care Coordination [facility] Note Transition Care Management Unsuccessful Follow-up Telephone Call  Date of discharge and from where:  03/28/22 [facility]  Attempts:  2nd Attempt  Reason for unsuccessful TCM follow-up call:  Left voice message  Oneta Rack, RN, BSN, [place] RN [place] Management [phone]: direct office

## 2022-04-02 ENCOUNTER — Telehealth: Payer: Self-pay | Admitting: *Deleted

## 2022-04-02 ENCOUNTER — Encounter: Payer: Self-pay | Admitting: *Deleted

## 2022-04-02 NOTE — Patient Outreach (Signed)
  Care Coordination [facility] Note Transition Care Management Unsuccessful Follow-up Telephone Call  Date of discharge and from where:  03/28/22 [facility]  Attempts:  3rd Attempt  Reason for unsuccessful TCM follow-up call:  Left voice message  Oneta Rack, RN, BSN, [place] RN [place] Management [phone]: direct office

## 2022-04-07 NOTE — Progress Notes (Signed)
Date:  04/08/2022   ID:  Dawn Sherman, DOB 10/05/1955, MRN [id number]  Patient Location:  [place] [address]   Provider location:   [facility], [place] office  PCP:  [name], MD  Cardiologist:  Patsy Baltimore   Chief Complaint  Patient presents with   [facility] follow up     "Doing well." Medications reviewed by the patient verbally.      History of Present Illness:    Dawn Sherman is a 66 y.o. female  past medical history of smoker, COPD prior alcohol,  chronic kidney disease,  hypertension,  hospital June 2021 with worsening PND orthopnea, congestive heart failure symptoms, " panic attack" Smoker Normal ejection fraction April 2023 Who presents for follow-up of her diastolic and systolic CHF, PAF  Last seen by myself in clinic February 07, 2021  Echocardiogram April 2023 ejection fraction 65 to 70%  Recent hospitalization office first 2023, acute on chronic diastolic CHF Before admission was taking Lasix every other day Was drinking large volume fluids at home, presented to the hospital with hypoxia Was taking diuretic every other day treated with IV diuretics, held for worsening renal function, restarted on Lasix 40 daily  In today, reports that she feels well Reports drinking lots of water No leg edema, sleeping well  4 cigs a day, denies significant shortness of breath No PND orthopnea  Blood pressure running high today Taking amlodipine 5, hydralazine 25 3 times daily  EKG personally reviewed by myself on todays visit Normal sinus rhythm rate 85 bpm  ST-T wave abnormality consider inferolateral ischemia Noted previously, unchanged from January 2023  Other past medical history reviewed  hospital June 2021 Echocardiogram confirming ejection fraction 30%, global Unable to exclude hypertensive cardiomyopath In the setting of paroxysmal atrial fibrillation  pleural effusion on CT scan  Prior records  reviewed hyperkalemia with K as high as 6.9 on 08/04/20 and her Delene Loll has had to be discontinued. ACE and ARB avoided    Past Medical History:  Diagnosis Date   B12 deficiency    CHF (congestive heart failure) (HCC)    CKD (chronic kidney disease), stage III ([place])    Depression    Endometriosis    Hypertension    Mixed hyperlipidemia    Tobacco abuse    Past Surgical History:  Procedure Laterality Date   ABDOMINAL HYSTERECTOMY     BREAST BIOPSY Right 2015   neg-  FIBROADENOMA   TAH/RSO  1999   secondary to bleeding and endometriosis (Dr Vernie Ammons)      Allergies:   Sulfate, Codeine sulfate, Benicar [olmesartan], Amoxicillin, Clindamycin/lincomycin, Morphine and related, and Penicillins   Social History   Tobacco Use   Smoking status: Every Day    Packs/day: 0.25    Years: 40.00    Total pack years: 10.00    Types: Cigarettes   Smokeless tobacco: Never  Vaping Use   Vaping Use: Never used  Substance Use Topics   Alcohol use: Not Currently    Alcohol/week: 0.0 standard drinks of alcohol    Comment: occasional   Drug use: No     Current Outpatient Medications on File Prior to Visit  Medication Sig Dispense Refill   acetaminophen (TYLENOL) 325 MG tablet Take 650 mg by mouth every 6 (six) hours as needed.     albuterol (VENTOLIN HFA) 108 (90 Base) MCG/ACT inhaler Inhale 2 puffs into the lungs every 6 (six) hours  as needed for wheezing or shortness of breath. 8 g 2   amLODipine (NORVASC) 10 MG tablet Take 0.5 tablets (5 mg total) by mouth daily. Home med.     atorvastatin (LIPITOR) 40 MG tablet TAKE ONE TABLET BY MOUTH DAILY 90 tablet 0   buPROPion (WELLBUTRIN XL) 150 MG 24 hr tablet TAKE ONE TABLET BY MOUTH DAILY 90 tablet 0   calcium carbonate (OS-CAL - DOSED IN MG OF ELEMENTAL CALCIUM) 1250 (500 Ca) MG tablet Take 1 tablet (500 mg of elemental calcium total) by mouth 3 (three) times daily with meals. 60 tablet 1   FARXIGA 10 MG TABS tablet Take 10 mg by mouth  daily.     ferrous sulfate 325 (65 FE) MG tablet Take 1 tablet (325 mg total) by mouth 2 (two) times daily with a meal. 60 tablet 3   Fluticasone-Umeclidin-Vilant (TRELEGY ELLIPTA) 100-62.5-25 MCG/ACT AEPB Inhale 1 puff into the lungs daily. 3 each 3   furosemide (LASIX) 40 MG tablet Take 1 tablet (40 mg total) by mouth daily. 30 tablet 2   hydrALAZINE (APRESOLINE) 25 MG tablet Take 1 tablet (25 mg total) by mouth 3 (three) times daily. 270 tablet 3   loratadine (CLARITIN) 10 MG tablet Take 10 mg by mouth daily.     omeprazole (PRILOSEC) 20 MG capsule Take 20 mg by mouth daily.     venlafaxine XR (EFFEXOR-XR) 150 MG 24 hr capsule TAKE 1 CAPSULE BY MOUTH EVERY DAY 90 capsule 1   nicotine (NICODERM CQ - DOSED IN MG/24 HOURS) 21 mg/24hr patch Place 1 patch (21 mg total) onto the skin daily. (Patient not taking: Reported on 04/08/2022) 28 patch 0   No current facility-administered medications on file prior to visit.     Family Hx: The patient's family history includes Fibromyalgia in her daughter; Hypercholesterolemia in her mother; Rheum arthritis in her daughter and father. There is no history of Breast cancer or Colon cancer.  ROS:   Please see the history of present illness.    Review of Systems  Constitutional: Negative.   HENT: Negative.    Respiratory: Negative.    Cardiovascular: Negative.   Gastrointestinal: Negative.   Musculoskeletal: Negative.   Neurological: Negative.   Psychiatric/Behavioral: Negative.    All other systems reviewed and are negative.    Labs/Other Tests and Data Reviewed:    Recent Labs: 02/28/2022: TSH 2.66 03/25/2022: ALT 16; B Natriuretic Peptide 1,712.2 03/28/2022: BUN 40; Creatinine, Ser 2.44; Hemoglobin 10.8; Magnesium 2.1; Platelets 253; Potassium 4.1; Sodium 137   Recent Lipid Panel Lab Results  Component Value Date/Time   CHOL 183 03/27/2022 04:18 AM   TRIG 83 03/27/2022 04:18 AM   HDL 87 03/27/2022 04:18 AM   CHOLHDL 2.1 03/27/2022 04:18 AM    LDLCALC 79 03/27/2022 04:18 AM   LDLDIRECT 53.3 10/14/2012 08:21 AM    Wt Readings from Last 3 Encounters:  04/08/22 119 lb 4 oz (54.1 kg)  03/28/22 116 lb 12.8 oz (53 kg)  03/19/22 121 lb 6 oz (55.1 kg)     Exam:    Vital Signs: Vital signs may also be detailed in the HPI BP (!) 152/82 (BP Location: Left Arm, Patient Position: Sitting, Cuff Size: Normal)   Pulse 85   Ht 5\' 5"  (1.651 m)   Wt 119 lb 4 oz (54.1 kg)   SpO2 97%   BMI 19.84 kg/m   Constitutional:  oriented to person, place, and time. No distress.  HENT:  Head: Grossly normal Eyes:  no discharge. No scleral icterus.  Neck: No JVD, no carotid bruits  Cardiovascular: Regular rate and rhythm, no murmurs appreciated Pulmonary/Chest: Clear to auscultation bilaterally, no wheezes or rails Abdominal: Soft.  no distension.  no tenderness.  Musculoskeletal: Normal range of motion Neurological:  normal muscle tone. Coordination normal. No atrophy Skin: Skin warm and dry Psychiatric: normal affect, pleasant   ASSESSMENT & PLAN:    Problem List Items Addressed This Visit       Cardiology Problems   Essential hypertension     Other   Chronic obstructive pulmonary disease (HCC)   CKD (chronic kidney disease) stage 3, GFR 30-59 ml/min (HCC)   Other Visit Diagnoses     Chronic diastolic heart failure (HCC)    -  Primary   PAF (paroxysmal atrial fibrillation) ([place])       Tobacco use       Lymphedema       Chronic renal impairment, stage 3b ([place])       Obstructive sleep apnea syndrome         Dilated cardiomyopathy Ejection fraction normalized on echocardiogram April 2023 -Did not tolerate ARB, Entresto secondary to hyperkalemia in the setting of renal failure Tolerating  carvedilol, Farxiga Stressed importance of aggressive blood pressure control Recommended increase of amlodipine from 5 up to 10 mg daily and to call us with blood pressure numbers  Paroxysmal atrial fibrillation Rec when she restart Eliquis  2.5 twice daily, renally dosed and weight less than 60 kg, carvedilol as above  Chronic kidney disease Stable creatinine >2 Repeat BMP on Lasix daily  Hyperlipidemia Recommend Lipitor daily   Total encounter time more than 30 minutes  Greater than 50% was spent in counseling and coordination of care with the patient    Signed, Ida Rogue, [place] [facility] [address], [place], [place] [address]

## 2022-04-08 ENCOUNTER — Other Ambulatory Visit
Admission: RE | Admit: 2022-04-08 | Discharge: 2022-04-08 | Disposition: A | Discharge status: Home / Self Care | Payer: Medicare Other | Attending: Cardiovascular Disease | Admitting: Cardiovascular Disease

## 2022-04-08 ENCOUNTER — Ambulatory Visit (INDEPENDENT_AMBULATORY_CARE_PROVIDER_SITE_OTHER): Payer: Medicare Other | Admitting: Cardiovascular Disease

## 2022-04-08 ENCOUNTER — Encounter: Payer: Self-pay | Admitting: Cardiovascular Disease

## 2022-04-08 VITALS — BP 152/82 | HR 85 | Ht 65.0 in | Wt 119.2 lb

## 2022-04-08 DIAGNOSIS — I5032 Chronic diastolic (congestive) heart failure: Secondary | ICD-10-CM

## 2022-04-08 DIAGNOSIS — I480 Paroxysmal atrial fibrillation: Secondary | ICD-10-CM

## 2022-04-08 DIAGNOSIS — G4733 Obstructive sleep apnea (adult) (pediatric): Secondary | ICD-10-CM

## 2022-04-08 DIAGNOSIS — J432 Centrilobular emphysema: Secondary | ICD-10-CM

## 2022-04-08 DIAGNOSIS — N1832 Chronic kidney disease, stage 3b: Secondary | ICD-10-CM

## 2022-04-08 DIAGNOSIS — Z720 Tobacco use: Secondary | ICD-10-CM

## 2022-04-08 DIAGNOSIS — I10 Essential (primary) hypertension: Secondary | ICD-10-CM

## 2022-04-08 DIAGNOSIS — I890 Lymphedema, not elsewhere classified: Secondary | ICD-10-CM

## 2022-04-08 LAB — BASIC METABOLIC PANEL
Anion gap: 9 (ref 5–15)
BUN: 37 mg/dL — ABNORMAL HIGH (ref 8–23)
CO2: 19 mmol/L — ABNORMAL LOW (ref 22–32)
Calcium: 8.7 mg/dL — ABNORMAL LOW (ref 8.9–10.3)
Chloride: 107 mmol/L (ref 98–111)
Creatinine, Ser: 2.32 mg/dL — ABNORMAL HIGH (ref 0.44–1.00)
GFR, Estimated: 23 mL/min — ABNORMAL LOW (ref >60)
Glucose, Bld: 82 mg/dL (ref 70–99)
Potassium: 4.8 mmol/L (ref 3.5–5.1)
Sodium: 135 mmol/L (ref 135–145)

## 2022-04-08 MED ORDER — APIXABAN 2.5 MG PO TABS: 2.5000 mg | ORAL_TABLET | Freq: Two times a day (BID) | ORAL | 6 refills | Status: DC

## 2022-04-08 MED ORDER — AMLODIPINE BESYLATE 10 MG PO TABS: 10.0000 mg | ORAL_TABLET | Freq: Every day | ORAL | 3 refills | Status: DC

## 2022-04-08 NOTE — Patient Instructions (Addendum)
Medication Instructions:  Please increase the amlodipine up to 10 mg daily Restart eliquis 2.5 mg twice a day  Monitor blood rpessure, Call with numbers  If you need a refill on your cardiac medications before your next appointment, please call your pharmacy.   Lab work: At your convenience: [place] at [facility] 1st desk on the right to check in (REGISTRATION)  Lab hours: Monday- Friday (7:30 am- 5:30 pm)   Testing/Procedures: No new testing needed  Follow-Up: At [facility], you and your health needs are our priority.  As part of our continuing mission to provide you with exceptional heart care, we have created designated Provider Care Teams.  These Care Teams include your primary Cardiologist (physician) and Advanced Practice Providers (APPs -  Physician Assistants and Nurse Practitioners) who all work together to provide you with the care you need, when you need it.  You will need a follow up appointment in 6 months, APP ok  Providers on your designated Care Team:   Murray Hodgkins, NP Christell Faith, PA-C Cadence Kathlen Mody, [place]  COVID-19 Vaccine Information can be found at: [email or web address] For questions related to vaccine distribution or appointments, please email [email or web address] or call [phone].

## 2022-04-09 ENCOUNTER — Telehealth: Payer: Self-pay | Admitting: Pharmacist

## 2022-04-09 NOTE — Progress Notes (Signed)
[facility]) Care Management  [place]   04/09/2022  Dawn Sherman [birth year] [id number]  Reason for referral: medication assistance  Referral source: patient self-referral Referral medication(s): Eliquis Current insurance:BCBS  Objective: Allergies  Allergen Reactions   Sulfate Rash   Codeine Sulfate Nausea Only   Benicar [Olmesartan]     Talked with patient February 10, 2020, intolerance is unclear, tried several medications around that time and one of them gave her a rash but she is not clear which 1.   Amoxicillin Rash   Clindamycin/Lincomycin Rash   Morphine And Related Rash   Penicillins Rash    Medications Reviewed Today     Reviewed by Anselm Pancoast, CMA (Certified Medical Assistant) on 04/08/22 at 1058  Med List Status: <None>   Medication Order Taking? Sig Documenting Provider Last Dose Status Informant  acetaminophen (TYLENOL) 325 MG tablet [id number]  Take 650 mg by mouth every 6 (six) hours as needed. [provider]  Active Self, Multiple Informants, Pharmacy Records  albuterol (VENTOLIN HFA) 108 (90 Base) MCG/ACT inhaler [id number]  Inhale 2 puffs into the lungs every 6 (six) hours as needed for wheezing or shortness of breath. Lucrezia Starch, MD  Active Self, Multiple Informants, Pharmacy Records  amLODipine (NORVASC) 10 MG tablet [id number]  Take 0.5 tablets (5 mg total) by mouth daily. Home med. Enzo Bi, MD  Active   atorvastatin (LIPITOR) 40 MG tablet [id number]  TAKE ONE TABLET BY MOUTH DAILY [name], MD  Active Self, Multiple Informants, Pharmacy Records  buPROPion (WELLBUTRIN XL) 150 MG 24 hr tablet [id number]  TAKE ONE TABLET BY MOUTH DAILY [name], MD  Active Self, Multiple Informants, Pharmacy Records  calcium carbonate (OS-CAL - DOSED IN MG OF ELEMENTAL CALCIUM) 1250 (500 Ca) MG tablet [id number]  Take 1 tablet (500 mg of elemental calcium total) by mouth 3 (three) times daily with meals. Fritzi Mandes, MD   Active Self, Multiple Informants, Pharmacy Records  FARXIGA 10 MG TABS tablet [id number]  Take 10 mg by mouth daily. [provider]  Active Self, Multiple Informants, Pharmacy Records  ferrous sulfate 325 (65 FE) MG tablet [id number]  Take 1 tablet (325 mg total) by mouth 2 (two) times daily with a meal. Fritzi Mandes, MD  Active Self, Multiple Informants, Pharmacy Records  Fluticasone-Umeclidin-Vilant (TRELEGY ELLIPTA) 100-62.5-25 MCG/ACT AEPB [id number]  Inhale 1 puff into the lungs daily. [name], MD  Active Self, Multiple Informants, Pharmacy Records           Med Note [place], Lonn Georgia F   Tue Mar 26, 2022  1:04 AM) Was recenly approved for Prior auth but is waiting on medication to be delivered  furosemide (LASIX) 40 MG tablet [id number]  Take 1 tablet (40 mg total) by mouth daily. Enzo Bi, MD  Active   hydrALAZINE (APRESOLINE) 25 MG tablet [id number]  Take 1 tablet (25 mg total) by mouth 3 (three) times daily. Alisa Graff, FNP  Active Self, Multiple Informants, Pharmacy Records  loratadine (CLARITIN) 10 MG tablet [id number]  Take 10 mg by mouth daily. [provider]  Active Self, Multiple Informants, Pharmacy Records  nicotine (NICODERM CQ - DOSED IN MG/24 HOURS) 21 mg/24hr patch [id number]  Place 1 patch (21 mg total) onto the skin daily. Enzo Bi, MD  Active   omeprazole (PRILOSEC) 20 MG capsule [id number]  Take 20 mg by mouth daily. [provider]  Active Self, Multiple Informants, Pharmacy Records  venlafaxine XR (EFFEXOR-XR) 150 MG 24  hr capsule [id number]  TAKE 1 CAPSULE BY MOUTH EVERY DAY [name], MD  Active Self, Multiple Informants, Pharmacy Records             Medication Assistance Findings:  Medication assistance needs identified: Patient reported recently being restarted on Eliquis and having to pay more than $150 for it when she went to pick it up.  She called our office for assistance as we helped her before. She appears to qualify and  communicated understanding about signing the forms and providing the necessary financial documentation.   Additional medication assistance options reviewed with patient as warranted:  No other options identified  Plan: I will route patient assistance letter to [place] technician who will coordinate patient assistance program application process for medications listed above.  [facility] pharmacy technician will assist with obtaining all required documents from both patient and provider(s) and submit application(s) once completed.     Elayne Guerin, PharmD, [place] Clinical Pharmacist [phone]

## 2022-04-11 ENCOUNTER — Other Ambulatory Visit: Payer: Self-pay | Admitting: Internal Medicine

## 2022-04-11 ENCOUNTER — Telehealth: Payer: Self-pay | Admitting: Pharmacy Technician

## 2022-04-11 DIAGNOSIS — Z596 Low income: Secondary | ICD-10-CM

## 2022-04-11 NOTE — Progress Notes (Signed)
Dawn Sherman)                                            [place] Team    04/11/2022  Dawn Sherman 28-Sep-1955 [id number]                                      Medication Assistance Referral  Referral From: Dawn Sherman  Medication/Company: Eliquis / BMS Patient application portion:  Mailed Provider application portion: Faxed  to Dr. Ida Rogue Provider address/fax verified via: Office website   Dawn Sherman P. Dawn Sherman, [place]  [phone]

## 2022-04-19 ENCOUNTER — Telehealth: Payer: Self-pay

## 2022-04-19 NOTE — Telephone Encounter (Signed)
Spoke w/ pt.  Advised her of Dr. Donivan Scull recommendation. She is appreciative of the call.

## 2022-04-19 NOTE — Telephone Encounter (Addendum)
Dawn Merritts, MD  04/12/2022  5:32 PM EDT Back to Top    Lab work reviewed  stable renal function on Lasix 40 daily after recent discharge from the hospital   Left message for pt to call back.

## 2022-04-19 NOTE — Telephone Encounter (Signed)
Patient returning call.

## 2022-04-22 ENCOUNTER — Ambulatory Visit: Payer: Medicare Other | Admitting: Family

## 2022-04-23 ENCOUNTER — Other Ambulatory Visit: Payer: Self-pay | Admitting: Family

## 2022-04-23 ENCOUNTER — Telehealth: Payer: Self-pay | Admitting: Pharmacy Technician

## 2022-04-23 DIAGNOSIS — Z596 Low income: Secondary | ICD-10-CM

## 2022-04-23 NOTE — Progress Notes (Signed)
[facility])                                            [place] Team    04/23/2022  Dawn Sherman [birth year] [id number]  Received both patient and provider portion(s) of patient assistance application(s) for Eliquis. Faxed completed application and required documents into BMS.    Rettie Laird P. Zethan Alfieri, [place]  [phone]

## 2022-04-24 ENCOUNTER — Ambulatory Visit: Payer: Medicare Other | Attending: Family | Admitting: Family

## 2022-04-24 ENCOUNTER — Encounter: Payer: Self-pay | Admitting: Family

## 2022-04-24 ENCOUNTER — Encounter: Payer: Self-pay | Admitting: Pharmacist

## 2022-04-24 VITALS — BP 145/72 | HR 51 | Resp 18 | Ht 65.0 in | Wt 123.0 lb

## 2022-04-24 DIAGNOSIS — N183 Chronic kidney disease, stage 3 unspecified: Secondary | ICD-10-CM | POA: Diagnosis not present

## 2022-04-24 DIAGNOSIS — I5032 Chronic diastolic (congestive) heart failure: Secondary | ICD-10-CM | POA: Insufficient documentation

## 2022-04-24 DIAGNOSIS — F32A Depression, unspecified: Secondary | ICD-10-CM | POA: Insufficient documentation

## 2022-04-24 DIAGNOSIS — F17210 Nicotine dependence, cigarettes, uncomplicated: Secondary | ICD-10-CM | POA: Insufficient documentation

## 2022-04-24 DIAGNOSIS — E782 Mixed hyperlipidemia: Secondary | ICD-10-CM | POA: Insufficient documentation

## 2022-04-24 DIAGNOSIS — I130 Hypertensive heart and chronic kidney disease with heart failure and stage 1 through stage 4 chronic kidney disease, or unspecified chronic kidney disease: Secondary | ICD-10-CM | POA: Insufficient documentation

## 2022-04-24 DIAGNOSIS — I480 Paroxysmal atrial fibrillation: Secondary | ICD-10-CM | POA: Diagnosis not present

## 2022-04-24 DIAGNOSIS — Z7984 Long term (current) use of oral hypoglycemic drugs: Secondary | ICD-10-CM | POA: Insufficient documentation

## 2022-04-24 DIAGNOSIS — I10 Essential (primary) hypertension: Secondary | ICD-10-CM | POA: Diagnosis not present

## 2022-04-24 DIAGNOSIS — Z720 Tobacco use: Secondary | ICD-10-CM | POA: Diagnosis not present

## 2022-04-24 DIAGNOSIS — I4891 Unspecified atrial fibrillation: Secondary | ICD-10-CM | POA: Insufficient documentation

## 2022-04-24 MED ORDER — FUROSEMIDE 40 MG PO TABS: 40.0000 mg | ORAL_TABLET | Freq: Every day | ORAL | 6 refills | Status: DC

## 2022-04-24 NOTE — Patient Instructions (Addendum)
Continue weighing daily and call for an overnight weight gain of 3 pounds or more or a weekly weight gain of more than 5 pounds.   Continue low-sodium diet and not adding salt to foods.

## 2022-04-24 NOTE — Progress Notes (Signed)
Patient ID: Dawn Sherman, female   DOB: September 09, 1955, 66 y.o.   MRN: [id number] [place] - PHARMACIST COUNSELING NOTE  *HFpEF*  ACE/ARB/ARNI:  intolerant to Entresto - hyperkalemia, rash with olmesartan Beta Blocker:  none - HR limiting at the moment Aldosterone Antagonist:  n/a - Cdk and hx of hyperkalemia Diuretic: Furosemide 40 mg daily and 40mg  PRN for fluid retention SGLT2i: Dapagliflozin 10 mg daily  Adherence Assessment  Do you ever forget to take your medication? [] Yes [x] No  Do you ever skip doses due to side effects? [] Yes [x] No  Do you have trouble affording your medicines? [] Yes [x] No  Are you ever unable to pick up your medication due to transportation difficulties? [] Yes [x] No  Do you ever stop taking your medications because you don't believe they are helping? [] Yes [x] No  Do you check your weight daily? [x] Yes [] No   Adherence strategy: pill organizer  Barriers to obtaining medications: Patient assistance for Farxiga and inhaler  Vital signs: HR 51, BP 145/72, weight (pounds) 123 lb (up 4lbs in 2 weeks) ECHO: Date 12/11/2021, EF 65-70%, notes  mild LVH/ LAE and mild MR     Latest Ref Rng & Units 04/08/2022   12:01 PM 03/28/2022    4:31 AM 03/27/2022    4:18 AM  BMP  Glucose 70 - 99 mg/dL 82  99  105   BUN 8 - 23 mg/dL 37  40  36   Creatinine 0.44 - 1.00 mg/dL 2.32  2.44  2.78   Sodium 135 - 145 mmol/L 135  137  140   Potassium 3.5 - 5.1 mmol/L 4.8  4.1  3.9   Chloride 98 - 111 mmol/L 107  108  109   CO2 22 - 32 mmol/L 19  22  22    Calcium 8.9 - 10.3 mg/dL 8.7  9.1  9.1     Past Medical History:  Diagnosis Date   B12 deficiency    CHF (congestive heart failure) (HCC)    CKD (chronic kidney disease), stage III (HCC)    Depression    Endometriosis    Hypertension    Mixed hyperlipidemia    Tobacco abuse     ASSESSMENT 66 year old female who presents to the HF clinic for follow up. PMH includes  HTN, CKD,  hyperlipidemia, depression, current tobacco use,and chronic HF. Note increase weight but patient reports stable weight at home and denies swelling, shortness or breath, fall, or dizziness. Admits to smoking 5-6 cigarettes per day and had NO interest on smoking cessation. Her last hospital admission was July/31 for acute on chronic HF.  Noted BP slightly above goal but patient report drinking a coffee and having cigarette shortly before this visit. She is tolerating amlodipine 10 mg started 2 weeks ago. Medication reconciliation completed by me (pharmacist) this morning, prior to NP assessment.  PLAN (recommendation)  Smoking cessation counseling No medication change at this time Refill diuretic and follow up in 4 weeks   Time spent: 15 minutes Dawn Sherman PharmD, BCPS 04/24/2022 9:37 AM    Current Outpatient Medications:    acetaminophen (TYLENOL) 325 MG tablet, Take 650 mg by mouth every 6 (six) hours as needed., Disp: , Rfl:    albuterol (VENTOLIN HFA) 108 (90 Base) MCG/ACT inhaler, Inhale 2 puffs into the lungs every 6 (six) hours as needed for wheezing or shortness of breath., Disp: 8 g, Rfl: 2   amLODipine (NORVASC) 10 MG tablet, Take 1 tablet (  10 mg total) by mouth daily., Disp: 90 tablet, Rfl: 3   apixaban (ELIQUIS) 2.5 MG TABS tablet, Take 1 tablet (2.5 mg total) by mouth 2 (two) times daily., Disp: 60 tablet, Rfl: 6   atorvastatin (LIPITOR) 40 MG tablet, TAKE ONE TABLET BY MOUTH DAILY, Disp: 90 tablet, Rfl: 0   buPROPion (WELLBUTRIN XL) 150 MG 24 hr tablet, TAKE ONE TABLET BY MOUTH EVERY DAY, Disp: 90 tablet, Rfl: 0   calcium carbonate (OS-CAL - DOSED IN MG OF ELEMENTAL CALCIUM) 1250 (500 Ca) MG tablet, Take 1 tablet (500 mg of elemental calcium total) by mouth 3 (three) times daily with meals., Disp: 60 tablet, Rfl: 1   FARXIGA 10 MG TABS tablet, Take 10 mg by mouth daily., Disp: , Rfl:    ferrous sulfate 325 (65 FE) MG tablet, Take 1 tablet (325 mg total) by mouth 2  (two) times daily with a meal., Disp: 60 tablet, Rfl: 3   Fluticasone-Umeclidin-Vilant (TRELEGY ELLIPTA) 100-62.5-25 MCG/ACT AEPB, Inhale 1 puff into the lungs daily., Disp: 3 each, Rfl: 3   furosemide (LASIX) 40 MG tablet, Take 1 tablet (40 mg total) by mouth daily., Disp: 30 tablet, Rfl: 2   hydrALAZINE (APRESOLINE) 25 MG tablet, Take 1 tablet (25 mg total) by mouth 3 (three) times daily., Disp: 270 tablet, Rfl: 3   loratadine (CLARITIN) 10 MG tablet, Take 10 mg by mouth daily., Disp: , Rfl:    nicotine (NICODERM CQ - DOSED IN MG/24 HOURS) 21 mg/24hr patch, Place 1 patch (21 mg total) onto the skin daily. (Patient not taking: Reported on 04/08/2022), Disp: 28 patch, Rfl: 0   omeprazole (PRILOSEC) 20 MG capsule, Take 20 mg by mouth daily., Disp: , Rfl:    venlafaxine XR (EFFEXOR-XR) 150 MG 24 hr capsule, TAKE 1 CAPSULE BY MOUTH EVERY DAY, Disp: 90 capsule, Rfl: 1   MEDICATION ADHERENCES TIPS AND STRATEGIES Taking medication as prescribed improves patient outcomes in heart failure (reduces hospitalizations, improves symptoms, increases survival) Side effects of medications can be managed by decreasing doses, switching agents, stopping drugs, or adding additional therapy. Please let someone in the [facility] [place] [facility] know if you have having bothersome side effects so we can modify your regimen. Do not alter your medication regimen without talking to [place].  Medication reminders can help patients remember to take drugs on time. If you are missing or forgetting doses you can try linking behaviors, using pill boxes, or an electronic reminder like an alarm on your phone or an app. Some people can also get automated phone calls as medication reminders.

## 2022-04-24 NOTE — Progress Notes (Signed)
Patient ID: Dawn Sherman, female    DOB: November 04, 1955, 66 y.o.   MRN: [id number]   Ms Lal is a 66 y/o female with a history of HTN, CKD, hyperlipidemia, depression, current tobacco use and chronic heart failure.   Echo report from 12/11/21 reviewed and showed an EF of 65-70% along with mild LVH/ LAE and mild MR. Echo report from 08/20/21 reviewed and showed EF of 50 to 55%. The left ventricle has low normal function. The left ventricle has no regional Jazmine Longshore motion abnormalities. The left ventricular internal cavity size was mildly dilated. Echo report from 02/10/20 reviewed and showed an EF of 30-35% along with mildly elevated PA pressure, moderate MR and mild/moderate TR.   Admitted 03/25/22 due to Acute on Chronic Heart failure. Initially given IV lasix with transition to oral diuretics. Cardiology consult obtained. Troponin elevation thought to be due to demand ischemia. Discharged after 3 days. ED 03/14/22 due to SOB due to HF exacerbation. Given IV lasix and nebulizer with improvement of symptoms and she was released. Was in the ED 12/16/21 due to weakness. Head CT unremarkable. Slight dehydration corrected and she was released. Admitted 12/10/21 due to a fall in the setting of bradycardia and developed subarachnoid hemorrhage. Cardiology, neurosurgery & hematology consults obtained. Head CT obtained. NOC stopped. Platelet transfusions needed for new ITP. Given decadron and 2 doses of IV IG. Hypokalemia corrected. Coreg held due to bradycardia. Discharged after 4 days.   She presents today with a chief complaint of a follow-up visit. She currently has no complaints and specifically denies any difficulty sleeping, dizziness, abdominal distention, palpitations, pedal edema, chest pain, shortness of breath, cough, fatigue or weight gain.   Follows a low sodium diet and does not add salt to foods. Drinks mostly water and monitors fluid intake and monitors weight daily. Walks daily as tolerated for her  exercise. Denies drinking alcohol but still smoking 4- 5 cigarettes a day. Says she needs refill of furosemide.    Past Medical History:  Diagnosis Date   B12 deficiency    CHF (congestive heart failure) ([place])    CKD (chronic kidney disease), stage III ([place])    Depression    Endometriosis    Hypertension    Mixed hyperlipidemia    Tobacco abuse    Past Surgical History:  Procedure Laterality Date   ABDOMINAL HYSTERECTOMY     BREAST BIOPSY Right 2015   neg-  FIBROADENOMA   TAH/RSO  1999   secondary to bleeding and endometriosis (Dr Vernie Ammons)   Family History  Problem Relation Age of Onset   Hypercholesterolemia Mother    Rheum arthritis Father    Rheum arthritis Daughter    Fibromyalgia Daughter    Breast cancer Neg Hx    Colon cancer Neg Hx    Social History   Tobacco Use   Smoking status: Every Day    Packs/day: 0.25    Years: 40.00    Total pack years: 10.00    Types: Cigarettes   Smokeless tobacco: Never  Substance Use Topics   Alcohol use: Not Currently    Alcohol/week: 0.0 standard drinks of alcohol    Comment: occasional   Allergies  Allergen Reactions   Sulfate Rash   Codeine Sulfate Nausea Only   Benicar [Olmesartan]     Talked with patient February 10, 2020, intolerance is unclear, tried several medications around that time and one of them gave her a rash but she is not clear which 1.  Amoxicillin Rash   Clindamycin/Lincomycin Rash   Morphine And Related Rash   Penicillins Rash   Prior to Admission medications   Medication Sig Start Date End Date Taking? Authorizing Provider  acetaminophen (TYLENOL) 325 MG tablet Take 650 mg by mouth every 6 (six) hours as needed.   Yes [provider]  albuterol (VENTOLIN HFA) 108 (90 Base) MCG/ACT inhaler Inhale 2 puffs into the lungs every 6 (six) hours as needed for wheezing or shortness of breath. 03/14/22  Yes Lucrezia Starch, MD  amLODipine (NORVASC) 10 MG tablet Take 1 tablet (10 mg total) by mouth  daily. 04/08/22  Yes Gollan, Kathlene November, MD  apixaban (ELIQUIS) 2.5 MG TABS tablet Take 1 tablet (2.5 mg total) by mouth 2 (two) times daily. 04/08/22  Yes Gollan, Kathlene November, MD  atorvastatin (LIPITOR) 40 MG tablet TAKE ONE TABLET BY MOUTH DAILY 02/27/22  Yes [name], MD  buPROPion (WELLBUTRIN XL) 150 MG 24 hr tablet TAKE ONE TABLET BY MOUTH EVERY DAY 04/11/22  Yes [name], MD  calcium carbonate (OS-CAL - DOSED IN MG OF ELEMENTAL CALCIUM) 1250 (500 Ca) MG tablet Take 1 tablet (500 mg of elemental calcium total) by mouth 3 (three) times daily with meals. 12/14/21  Yes Fritzi Mandes, MD  FARXIGA 10 MG TABS tablet Take 10 mg by mouth daily. 11/20/21  Yes [provider]  ferrous sulfate 325 (65 FE) MG tablet Take 1 tablet (325 mg total) by mouth 2 (two) times daily with a meal. 12/14/21  Yes Fritzi Mandes, MD  Fluticasone-Umeclidin-Vilant (TRELEGY ELLIPTA) 100-62.5-25 MCG/ACT AEPB Inhale 1 puff into the lungs daily. 03/05/22  Yes [name], MD  furosemide (LASIX) 40 MG tablet Take 1 tablet (40 mg total) by mouth daily. Patient taking differently: Take 40 mg by mouth daily. Take 40 mg daily and and extra 40 mg as needed for fluid retention 03/28/22 06/26/22 Yes Enzo Bi, MD  hydrALAZINE (APRESOLINE) 25 MG tablet Take 1 tablet (25 mg total) by mouth 3 (three) times daily. 01/31/22  Yes Hackney, Tina A, FNP  loratadine (CLARITIN) 10 MG tablet Take 10 mg by mouth daily.   Yes [provider]  omeprazole (PRILOSEC) 20 MG capsule Take 20 mg by mouth daily.   Yes [provider]  venlafaxine XR (EFFEXOR-XR) 150 MG 24 hr capsule TAKE 1 CAPSULE BY MOUTH EVERY DAY 11/27/21  Yes [name], MD    Review of Systems  Constitutional:  Negative for appetite change and fatigue.  HENT:  Negative for congestion, postnasal drip and sore throat.   Eyes: Negative.   Respiratory:  Negative for cough, shortness of breath and wheezing.   Cardiovascular:  Negative for chest pain,  palpitations and leg swelling.  Gastrointestinal:  Negative for abdominal distention and abdominal pain.  Endocrine: Negative.   Genitourinary: Negative.   Musculoskeletal:  Negative for back pain and neck pain.  Skin: Negative.   Allergic/Immunologic: Negative.   Neurological:  Negative for dizziness and light-headedness.  Hematological:  Negative for adenopathy. Does not bruise/bleed easily.  Psychiatric/Behavioral:  Negative for dysphoric mood and sleep disturbance (sleeps on 1 pillow). The patient is not nervous/anxious.     Vitals:   04/24/22 0839  BP: (!) 145/72  Pulse: (!) 51  Resp: 18  SpO2: 98%   Wt Readings from Last 3 Encounters:  04/24/22 123 lb (55.8 kg)  04/08/22 119 lb 4 oz (54.1 kg)  03/28/22 116 lb 12.8 oz (53 kg)      Lab Results  Component Value Date   CREATININE 2.32 (H) 04/08/2022   CREATININE 2.44 (H) 03/28/2022   CREATININE 2.78 (H) 03/27/2022   Physical Exam Vitals and nursing note reviewed.  Constitutional:      General: She is not in acute distress.    Appearance: Normal appearance. She is not ill-appearing.  HENT:     Head: Normocephalic and atraumatic.  Cardiovascular:     Rate and Rhythm: Regular rhythm. Bradycardia present.     Heart sounds: No murmur heard. Pulmonary:     Effort: Pulmonary effort is normal. No respiratory distress.     Breath sounds: No wheezing or rales.  Abdominal:     General: There is no distension.     Palpations: Abdomen is soft.     Tenderness: There is no abdominal tenderness.  Musculoskeletal:        General: No tenderness.     Cervical back: Normal range of motion and neck supple.     Right lower leg: No edema.     Left lower leg: No edema.  Skin:    General: Skin is warm and dry.  Neurological:     General: No focal deficit present.     Mental Status: She is alert and oriented to person, place, and time.  Psychiatric:        Mood and Affect: Mood normal.        Behavior: Behavior normal.         Thought Content: Thought content normal.    Assessment & Plan:  1: Chronic heart failure with preserved ejection fraction with structural changes- - NYHA class I - euvolemic today - weighing daily; reminded to call for an overnight weight gain of >2 pounds or a weekly weight gain of >5 pounds - weight up 2 pounds from last visit here 03/19/22  - not adding salt to her food - (entresto had caused hyperkalemia) - on GDMT of Farxiga - BNP 03/25/22 was 1712.2  2: HTN- - BP  elevated (145/72) drank two cups of caffeinated coffee and smoked 3 cigarettes this AM  - saw PCP [name]) 03/05/22 - BMP 04/08/22 reviewed and showed sodium 135, potassium 4.8, creatinine 2.32 and GFR 23 - saw nephrology [name]) 11/20/21   3: Afib - SR today - saw cardiology Rockey Situ) 04/08/22  4: Tobacco use- - smoking 4-5 cigarettes/day - trying to completely quit - complete cessation discussed for 3 minutes with her   Medication list reviewed.   Return in 6 months, sooner if needed.

## 2022-05-01 ENCOUNTER — Telehealth: Payer: Self-pay | Admitting: Pharmacy Technician

## 2022-05-01 DIAGNOSIS — Z596 Low income: Secondary | ICD-10-CM

## 2022-05-01 NOTE — Progress Notes (Addendum)
[facility])                                            [place] Team    05/01/2022  MAKAILEE NUDELMAN Mar 13, 1956 [id number]  Care coordination call placed to [facility] in regard to Eliquis application.  Spoke to [place] who informs patient is DENIED for the program due to being over income. HIPAA compliant voicemail left for patient regarding denial.  Tiffny Gemmer P. Garrit Marrow, [place]  [phone]

## 2022-05-06 ENCOUNTER — Ambulatory Visit (INDEPENDENT_AMBULATORY_CARE_PROVIDER_SITE_OTHER): Payer: Medicare Other | Admitting: Internal Medicine

## 2022-05-06 ENCOUNTER — Encounter: Payer: Self-pay | Admitting: Internal Medicine

## 2022-05-06 VITALS — BP 140/70 | HR 89 | Temp 97.6°F | Resp 14 | Ht 65.0 in | Wt 122.4 lb

## 2022-05-06 DIAGNOSIS — D649 Anemia, unspecified: Secondary | ICD-10-CM

## 2022-05-06 DIAGNOSIS — I480 Paroxysmal atrial fibrillation: Secondary | ICD-10-CM

## 2022-05-06 DIAGNOSIS — I10 Essential (primary) hypertension: Secondary | ICD-10-CM

## 2022-05-06 DIAGNOSIS — Z1231 Encounter for screening mammogram for malignant neoplasm of breast: Secondary | ICD-10-CM

## 2022-05-06 DIAGNOSIS — J432 Centrilobular emphysema: Secondary | ICD-10-CM | POA: Diagnosis not present

## 2022-05-06 DIAGNOSIS — K219 Gastro-esophageal reflux disease without esophagitis: Secondary | ICD-10-CM

## 2022-05-06 DIAGNOSIS — N184 Chronic kidney disease, stage 4 (severe): Secondary | ICD-10-CM | POA: Diagnosis not present

## 2022-05-06 DIAGNOSIS — Z23 Encounter for immunization: Secondary | ICD-10-CM | POA: Diagnosis not present

## 2022-05-06 DIAGNOSIS — Z720 Tobacco use: Secondary | ICD-10-CM

## 2022-05-06 DIAGNOSIS — I5032 Chronic diastolic (congestive) heart failure: Secondary | ICD-10-CM

## 2022-05-06 DIAGNOSIS — F320 Major depressive disorder, single episode, mild: Secondary | ICD-10-CM

## 2022-05-06 DIAGNOSIS — E7800 Pure hypercholesterolemia, unspecified: Secondary | ICD-10-CM

## 2022-05-06 DIAGNOSIS — D509 Iron deficiency anemia, unspecified: Secondary | ICD-10-CM

## 2022-05-06 LAB — BASIC METABOLIC PANEL
BUN: 40 mg/dL — ABNORMAL HIGH (ref 6–23)
CO2: 20 mEq/L (ref 19–32)
Calcium: 9 mg/dL (ref 8.4–10.5)
Chloride: 105 mEq/L (ref 96–112)
Creatinine, Ser: 2.53 mg/dL — ABNORMAL HIGH (ref 0.40–1.20)
GFR: 19.22 mL/min — ABNORMAL LOW (ref >60.00)
Glucose, Bld: 81 mg/dL (ref 70–99)
Potassium: 4.7 mEq/L (ref 3.5–5.1)
Sodium: 137 mEq/L (ref 135–145)

## 2022-05-06 LAB — CBC WITH DIFFERENTIAL/PLATELET
Basophils Absolute: 0.1 10*3/uL (ref 0.0–0.1)
Basophils Relative: 1 % (ref 0.0–3.0)
Eosinophils Absolute: 0.2 10*3/uL (ref 0.0–0.7)
Eosinophils Relative: 2.6 % (ref 0.0–5.0)
HCT: 42.5 % (ref 36.0–46.0)
Hemoglobin: 13.6 g/dL (ref 12.0–15.0)
Lymphocytes Relative: 11.5 % — ABNORMAL LOW (ref 12.0–46.0)
Lymphs Abs: 0.9 10*3/uL (ref 0.7–4.0)
MCHC: 31.9 g/dL (ref 30.0–36.0)
MCV: 88.2 fl (ref 78.0–100.0)
Monocytes Absolute: 0.9 10*3/uL (ref 0.1–1.0)
Monocytes Relative: 10.5 % (ref 3.0–12.0)
Neutro Abs: 6.1 10*3/uL (ref 1.4–7.7)
Neutrophils Relative %: 74.4 % (ref 43.0–77.0)
Platelets: 209 10*3/uL (ref 150.0–400.0)
RBC: 4.82 Mil/uL (ref 3.87–5.11)
RDW: 22 % — ABNORMAL HIGH (ref 11.5–15.5)
WBC: 8.2 10*3/uL (ref 4.0–10.5)

## 2022-05-06 NOTE — Progress Notes (Signed)
Patient ID: [name], female   DOB: [birth year], 66 y.o.   MRN: [id number]   Subjective:    Patient ID: [name], female    DOB: 28-Jan-1956, 66 y.o.   MRN: [id number]   Patient here for  Chief Complaint  Patient presents with   Follow-up    2 mon, denies any concerns or pain.    Marland Kitchen   HPI Here to follow up regarding CHF, COPD, CKD and hypertension.  Tolerating carvedilol and farxiga. Saw Dr Rockey Situ 04/08/22.  Amlodipine increased to 10mg  q day.  Hospitalized 03/25/22 - 03/28/22 - acute on chronic diastolic CHF.  Discharged on daily lasix.  She is doing well on this regimen.  No chest pain.  No sob.  Weight staying stable.  Eating.  No nausea or vomiting.  No abdominal pain.  Bowels moving.  Still smoking, but has decreased to approximately 1/2 ppd.  Discussed due mammogram and bone density.     Past Medical History:  Diagnosis Date   B12 deficiency    CHF (congestive heart failure) (H[facility])    CKD (chronic kidney disease), stage III (HCC)    Depression    Endometriosis    Hypertension    Mixed hyperlipidemia    Tobacco abuse    Past Surgical History:  Procedure Laterality Date   ABDOMINAL HYSTERECTOMY     BREAST BIOPSY Right 2015   neg-  FIBROADENOMA   TAH/RSO  1999   secondary to bleeding and endometriosis (Dr Vernie Ammons)   Family History  Problem Relation Age of Onset   Hypercholesterolemia Mother    Rheum arthritis Father    Rheum arthritis Daughter    Fibromyalgia Daughter    Breast cancer Neg Hx    Colon cancer Neg Hx    Social History   Socioeconomic History   Marital status: Married    Spouse name: Not on file   Number of children: Not on file   Years of education: Not on file   Highest education level: Not on file  Occupational History   Not on file  Tobacco Use   Smoking status: Every Day    Packs/day: 0.25    Years: 40.00    Total pack years: 10.00    Types: Cigarettes   Smokeless tobacco: Never  Vaping Use   Vaping Use: Never used  Substance and  Sexual Activity   Alcohol use: Not Currently    Alcohol/week: 0.0 standard drinks of alcohol    Comment: occasional   Drug use: No   Sexual activity: Not Currently  Other Topics Concern   Not on file  Social History Narrative   Lives in [place] with her husband.  She is retired from [employer].  She does not routinely exercise.   Social Determinants of Health   Financial Resource Strain: Low Risk  (11/27/2021)   Overall Financial Resource Strain (CARDIA)    Difficulty of Paying Living Expenses: Not hard at all  Food Insecurity: No Food Insecurity (11/27/2021)   Hunger Vital Sign    Worried About Running Out of Food in the Last Year: Never true    Ran Out of Food in the Last Year: Never true  Transportation Needs: No Transportation Needs (11/27/2021)   PRAPARE - Hydrologist (Medical): No    Lack of Transportation (Non-Medical): No  Physical Activity: Sufficiently Active (11/27/2021)   Exercise Vital Sign    Days of Exercise per Week: 7 days  Minutes of Exercise per Session: 30 min  Stress: No Stress Concern Present (11/27/2021)   [place]    Feeling of Stress : Not at all  Social Connections: Unknown (11/27/2021)   Social Connection and Isolation Panel [NHANES]    Frequency of Communication with Friends and Family: Not on file    Frequency of Social Gatherings with Friends and Family: Not on file    Attends Religious Services: Not on file    Active Member of Clubs or Organizations: Not on file    Attends Archivist Meetings: Not on file    Marital Status: Married     Review of Systems  Constitutional:  Negative for appetite change and unexpected weight change.  HENT:  Negative for congestion and sinus pressure.   Respiratory:  Negative for cough, chest tightness and shortness of breath.   Cardiovascular:  Negative for chest pain, palpitations and leg swelling.   Gastrointestinal:  Negative for abdominal pain, diarrhea, nausea and vomiting.  Genitourinary:  Negative for difficulty urinating and dysuria.  Musculoskeletal:  Negative for joint swelling and myalgias.  Skin:  Negative for color change and rash.  Neurological:  Negative for dizziness, light-headedness and headaches.  Psychiatric/Behavioral:  Negative for agitation and dysphoric mood.        Objective:     BP (!) 140/70 (BP Location: Left Arm, Patient Position: Sitting, Cuff Size: Normal)   Pulse 89   Temp 97.6 F (36.4 C) (Oral)   Resp 14   Ht 5\' 5"  (1.651 m)   Wt 122 lb 6.4 oz (55.5 kg)   SpO2 95%   BMI 20.37 kg/m  Wt Readings from Last 3 Encounters:  05/06/22 122 lb 6.4 oz (55.5 kg)  04/24/22 123 lb (55.8 kg)  04/08/22 119 lb 4 oz (54.1 kg)    Physical Exam Vitals reviewed.  Constitutional:      General: She is not in acute distress.    Appearance: Normal appearance.  HENT:     Head: Normocephalic and atraumatic.     Right Ear: External ear normal.     Left Ear: External ear normal.  Eyes:     General: No scleral icterus.       Right eye: No discharge.        Left eye: No discharge.     Conjunctiva/sclera: Conjunctivae normal.  Neck:     Thyroid: No thyromegaly.  Cardiovascular:     Rate and Rhythm: Normal rate and regular rhythm.  Pulmonary:     Effort: No respiratory distress.     Breath sounds: Normal breath sounds. No wheezing.  Abdominal:     General: Bowel sounds are normal.     Palpations: Abdomen is soft.     Tenderness: There is no abdominal tenderness.  Musculoskeletal:        General: No swelling or tenderness.     Cervical back: Neck supple. No tenderness.  Lymphadenopathy:     Cervical: No cervical adenopathy.  Skin:    Findings: No erythema or rash.  Neurological:     Mental Status: She is alert.  Psychiatric:        Mood and Affect: Mood normal.        Behavior: Behavior normal.      Outpatient Encounter Medications as of  05/06/2022  Medication Sig   acetaminophen (TYLENOL) 325 MG tablet Take 650 mg by mouth every 6 (six) hours as needed.   albuterol (VENTOLIN HFA)  108 (90 Base) MCG/ACT inhaler Inhale 2 puffs into the lungs every 6 (six) hours as needed for wheezing or shortness of breath.   amLODipine (NORVASC) 10 MG tablet Take 1 tablet (10 mg total) by mouth daily.   apixaban (ELIQUIS) 2.5 MG TABS tablet Take 1 tablet (2.5 mg total) by mouth 2 (two) times daily.   atorvastatin (LIPITOR) 40 MG tablet TAKE ONE TABLET BY MOUTH DAILY   buPROPion (WELLBUTRIN XL) 150 MG 24 hr tablet TAKE ONE TABLET BY MOUTH EVERY DAY   calcium carbonate (OS-CAL - DOSED IN MG OF ELEMENTAL CALCIUM) 1250 (500 Ca) MG tablet Take 1 tablet (500 mg of elemental calcium total) by mouth 3 (three) times daily with meals.   FARXIGA 10 MG TABS tablet Take 10 mg by mouth daily.   ferrous sulfate 325 (65 FE) MG tablet Take 1 tablet (325 mg total) by mouth 2 (two) times daily with a meal.   Fluticasone-Umeclidin-Vilant (TRELEGY ELLIPTA) 100-62.5-25 MCG/ACT AEPB Inhale 1 puff into the lungs daily.   furosemide (LASIX) 40 MG tablet Take 1 tablet (40 mg total) by mouth daily. Take 40 mg daily and and extra 40 mg as needed for fluid retention   hydrALAZINE (APRESOLINE) 25 MG tablet Take 1 tablet (25 mg total) by mouth 3 (three) times daily.   loratadine (CLARITIN) 10 MG tablet Take 10 mg by mouth daily.   omeprazole (PRILOSEC) 20 MG capsule Take 20 mg by mouth daily.   venlafaxine XR (EFFEXOR-XR) 150 MG 24 hr capsule TAKE 1 CAPSULE BY MOUTH EVERY DAY   No facility-administered encounter medications on file as of 05/06/2022.     Lab Results  Component Value Date   WBC 8.2 05/06/2022   HGB 13.6 05/06/2022   HCT 42.5 05/06/2022   PLT 209.0 05/06/2022   GLUCOSE 81 05/06/2022   CHOL 183 03/27/2022   TRIG 83 03/27/2022   HDL 87 03/27/2022   LDLDIRECT 53.3 10/14/2012   LDLCALC 79 03/27/2022   ALT 16 03/25/2022   AST 19 03/25/2022   NA 137  05/06/2022   K 4.7 05/06/2022   CL 105 05/06/2022   CREATININE 2.53 (H) 05/06/2022   BUN 40 (H) 05/06/2022   CO2 20 05/06/2022   TSH 2.66 02/28/2022   INR 0.9 12/10/2021   HGBA1C 6.0 02/28/2022       Assessment & Plan:   Problem List Items Addressed This Visit     Anemia    Follow cbc.       Relevant Orders   CBC with Differential/Platelet (Completed)   Chronic diastolic CHF (congestive heart failure) ([place])    Continue farxiga.        Chronic obstructive pulmonary disease (HCC)    Breathing stable.  Follow.       CKD (chronic kidney disease), stage IV (H[facility])    Avoid antiinflammatories.        Relevant Orders   Basic metabolic panel (Completed)   Depression, major, single episode, mild ([place])    Continue effexor and wellbutrin. Has good support.  Follow.       GERD (gastroesophageal reflux disease)    No upper symptoms reported.  On prilosec.       Hypercholesterolemia    Continue lipitor.  Low cholesterol diet and exercise.  Follow lipid panel and liver function tests.        Hypertension    Continue amlodipine and hydralazine.  Follow pressures.  Follow metabolic panel.       Iron deficiency anemia  Iron supplements.  Follow cbc and iron studies.       Paroxysmal A-fib (HCC)    Back on eliquis.       Tobacco abuse    Discussed the need to stop smoking.       Other Visit Diagnoses     Need for immunization against influenza    -  Primary   Relevant Orders   Flu Vaccine QUAD High Dose(Fluad) (Completed)   Encounter for screening mammogram for malignant neoplasm of breast       Relevant Orders   MM 3D SCREEN BREAST BILATERAL        [name], MD

## 2022-05-06 NOTE — Patient Instructions (Signed)
Mammogram and bone density [facility] - 06/17/22 - 9:20

## 2022-05-07 ENCOUNTER — Telehealth: Payer: Self-pay

## 2022-05-07 NOTE — Telephone Encounter (Signed)
Lvm for pt to return call in regards to lab results.  Per Dr.Scott: Notify hgb and platelet count are wnl.  Kidney function slightly decreased from last check.  Relatively stable from check prior.  Make sure has f/u scheduled with her kidney MD - Dr [name].  If not scheduled, need to schedle.

## 2022-05-07 NOTE — Telephone Encounter (Signed)
Patient returned office phone call. 

## 2022-05-12 ENCOUNTER — Encounter: Payer: Self-pay | Admitting: Internal Medicine

## 2022-05-12 NOTE — Assessment & Plan Note (Signed)
No upper symptoms reported. On prilosec.  

## 2022-05-12 NOTE — Assessment & Plan Note (Signed)
Continue amlodipine and hydralazine.  Follow pressures.  Follow metabolic panel.

## 2022-05-12 NOTE — Assessment & Plan Note (Signed)
Follow cbc.  

## 2022-05-12 NOTE — Assessment & Plan Note (Signed)
Continue lipitor.  Low cholesterol diet and exercise.  Follow lipid panel and liver function tests.   

## 2022-05-12 NOTE — Assessment & Plan Note (Signed)
Discussed the need to stop smoking.   

## 2022-05-12 NOTE — Assessment & Plan Note (Signed)
Avoid antiinflammatories.

## 2022-05-12 NOTE — Assessment & Plan Note (Signed)
Continue effexor and wellbutrin. Has good support.  Follow.

## 2022-05-12 NOTE — Assessment & Plan Note (Signed)
Iron supplements.  Follow cbc and iron studies.

## 2022-05-12 NOTE — Assessment & Plan Note (Signed)
Continue farxiga.

## 2022-05-12 NOTE — Assessment & Plan Note (Signed)
Back on eliquis.

## 2022-05-12 NOTE — Assessment & Plan Note (Signed)
Breathing stable.  Follow.    

## 2022-05-29 ENCOUNTER — Other Ambulatory Visit: Payer: Self-pay | Admitting: Internal Medicine

## 2022-06-08 ENCOUNTER — Other Ambulatory Visit: Payer: Self-pay

## 2022-06-08 ENCOUNTER — Emergency Department
Admission: EM | Admit: 2022-06-08 | Discharge: 2022-06-08 | Disposition: A | Discharge status: Home / Self Care | Payer: Medicare Other | Attending: Emergency Medicine | Admitting: Emergency Medicine

## 2022-06-08 ENCOUNTER — Emergency Department: Payer: Medicare Other

## 2022-06-08 DIAGNOSIS — I4891 Unspecified atrial fibrillation: Secondary | ICD-10-CM | POA: Insufficient documentation

## 2022-06-08 DIAGNOSIS — I130 Hypertensive heart and chronic kidney disease with heart failure and stage 1 through stage 4 chronic kidney disease, or unspecified chronic kidney disease: Secondary | ICD-10-CM | POA: Insufficient documentation

## 2022-06-08 DIAGNOSIS — N189 Chronic kidney disease, unspecified: Secondary | ICD-10-CM | POA: Diagnosis not present

## 2022-06-08 DIAGNOSIS — N183 Chronic kidney disease, stage 3 unspecified: Secondary | ICD-10-CM | POA: Diagnosis not present

## 2022-06-08 DIAGNOSIS — E875 Hyperkalemia: Secondary | ICD-10-CM | POA: Diagnosis not present

## 2022-06-08 DIAGNOSIS — I5043 Acute on chronic combined systolic (congestive) and diastolic (congestive) heart failure: Secondary | ICD-10-CM | POA: Diagnosis not present

## 2022-06-08 DIAGNOSIS — R2243 Localized swelling, mass and lump, lower limb, bilateral: Secondary | ICD-10-CM | POA: Insufficient documentation

## 2022-06-08 DIAGNOSIS — R600 Localized edema: Secondary | ICD-10-CM

## 2022-06-08 LAB — CBC
HCT: 40.8 % (ref 36.0–46.0)
Hemoglobin: 12.8 g/dL (ref 12.0–15.0)
MCH: 27.9 pg (ref 26.0–34.0)
MCHC: 31.4 g/dL (ref 30.0–36.0)
MCV: 89.1 fL (ref 80.0–100.0)
Platelets: 225 10*3/uL (ref 150–400)
RBC: 4.58 MIL/uL (ref 3.87–5.11)
RDW: 17.5 % — ABNORMAL HIGH (ref 11.5–15.5)
WBC: 8 10*3/uL (ref 4.0–10.5)
nRBC: 0 % (ref 0.0–0.2)

## 2022-06-08 LAB — COMPREHENSIVE METABOLIC PANEL
ALT: 12 U/L (ref 0–44)
AST: 14 U/L — ABNORMAL LOW (ref 15–41)
Albumin: 3.8 g/dL (ref 3.5–5.0)
Alkaline Phosphatase: 95 U/L (ref 38–126)
Anion gap: 7 (ref 5–15)
BUN: 27 mg/dL — ABNORMAL HIGH (ref 8–23)
CO2: 18 mmol/L — ABNORMAL LOW (ref 22–32)
Calcium: 8.4 mg/dL — ABNORMAL LOW (ref 8.9–10.3)
Chloride: 111 mmol/L (ref 98–111)
Creatinine, Ser: 2.02 mg/dL — ABNORMAL HIGH (ref 0.44–1.00)
GFR, Estimated: 27 mL/min — ABNORMAL LOW (ref >60)
Glucose, Bld: 98 mg/dL (ref 70–99)
Potassium: 5.2 mmol/L — ABNORMAL HIGH (ref 3.5–5.1)
Sodium: 136 mmol/L (ref 135–145)
Total Bilirubin: 0.4 mg/dL (ref 0.3–1.2)
Total Protein: 6.7 g/dL (ref 6.5–8.1)

## 2022-06-08 LAB — BRAIN NATRIURETIC PEPTIDE: B Natriuretic Peptide: 909.5 pg/mL — ABNORMAL HIGH (ref 0.0–100.0)

## 2022-06-08 MED ORDER — FUROSEMIDE 10 MG/ML IJ SOLN
40.0000 mg | Freq: Once | INTRAMUSCULAR | Status: AC
  Administered 2022-06-08: 40 mg via INTRAVENOUS
  Filled 2022-06-08: qty 4

## 2022-06-08 NOTE — ED Triage Notes (Signed)
Pt states she got up this AM and started making breakfast and she had a nosebleed- pt took her BP and it was high- pt states she ankles have been swollen the last couple days as well so she took an extra lasix which she states usually helps but did not this time- pt states her nose bleed lasted about 30 minutes

## 2022-06-08 NOTE — Discharge Instructions (Signed)
Please make sure you are avoiding high potassium containing foods.  Please continue to take your Lasix daily please elevate the legs and use compression stockings.  Please follow-up with Dr. Donivan Scull office within the next week or 2.  I would like you to have a repeat blood draw in about 5 to 7 days to ensure that your potassium has not risen.

## 2022-06-08 NOTE — ED Provider Notes (Signed)
[facility] Provider Note    Event Date/Time   First MD Initiated Contact with Patient 06/08/22 1257     (approximate)   History   Epistaxis and Hypertension   HPI  Dawn Sherman is a 66 y.o. female with past medical history of CKD, hypertension who presents with epistaxis and elevated blood pressure.  This morning patient noted epistaxis from the left nare.  It took about 30 minutes to resolve.  She checked her blood pressure was elevated in the 150s over 80s.  The last several days patient has also noticed lower extremity edema.  Has had similar edema in the past.  Denies shortness of breath or chest pain.  No other bleeding.  She is compliant with her medications.  Took an extra dose of Lasix yesterday which did not seem to help the swelling.  She still urinating.  Follows with Dr. Tobey Bride.  She is on Eliquis for A-fib.     Past Medical History:  Diagnosis Date   B12 deficiency    CHF (congestive heart failure) ([place])    CKD (chronic kidney disease), stage III ([place])    Depression    Endometriosis    Hypertension    Mixed hyperlipidemia    Tobacco abuse     Patient Active Problem List   Diagnosis Date Noted   Anemia 05/06/2022   Acute on chronic diastolic CHF (congestive heart failure) ([place]) 03/26/2022   CKD (chronic kidney disease), stage IV ([place]) 03/26/2022   Iron deficiency anemia 03/26/2022   Generalized weakness 12/17/2021   Essential hypertension 12/17/2021   Dyslipidemia 12/17/2021   Depression, major, single episode, complete remission ([place]) 12/17/2021   GERD without esophagitis 12/17/2021   Chronic diastolic CHF (congestive heart failure) ([place]) 12/17/2021   AKI (acute kidney injury) ([place]) 12/16/2021   Second degree AV block    Syncope and collapse 12/10/2021   History of subarachnoid hemorrhage 12/10/2021   Hypokalemia 12/10/2021   Hyponatremia 12/10/2021   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[place])    Acute on chronic combined systolic and diastolic CHF (congestive heart failure) ([place]) 02/10/2020   HLD (hyperlipidemia) 02/10/2020   Elevated troponin 02/10/2020   GERD (gastroesophageal reflux disease) 02/10/2020   Abnormal mammogram 07/13/2019   Sleep concern 10/17/2018   History of alcohol abuse 07/31/2018   B12 deficiency 07/31/2018   Acute renal failure superimposed on stage 3b chronic kidney disease ([place]) 11/03/2016   Health care maintenance 12/02/2014   Tobacco abuse 12/02/2014   Foot pain, left 11/29/2014   Hypercholesterolemia 11/29/2014   Encounter for screening colonoscopy 12/16/2013   Breast mass 12/15/2012   Hypertension 07/05/2012   Depression, major, single episode, mild ([place]) 07/05/2012     Physical Exam  Triage Vital Signs: ED Triage Vitals  Enc Vitals Group     BP 06/08/22 1045 (!) 151/80     Pulse Rate 06/08/22 1045 91     Resp 06/08/22 1045 18     Temp 06/08/22 1045 98.4 F (36.9 C)     Temp Source 06/08/22 1045 Oral     SpO2 06/08/22 1045 94 %  Weight 06/08/22 1046 130 lb (59 kg)     Height 06/08/22 1046 5\' 5"  (1.651 m)     Head Circumference --      Peak Flow --      Pain Score 06/08/22 1046 0     Pain Loc --      Pain Edu? --      Excl. in [place]? --     Most recent vital signs: Vitals:   06/08/22 1335 06/08/22 1400  BP:  (!) 158/77  Pulse: 86 86  Resp: 14 14  Temp:    SpO2: 97% 98%     General: Awake, no distress.  CV:  Good peripheral perfusion.  2+ pitting edema in bilateral lower extremities, symmetric Resp:  Normal effort.  No increased work of  breathing Abd:  No distention.  Neuro:             Awake, Alert, Oriented x 3  Other:  Dried blood in the left nare, no active bleeding   ED Results / Procedures / Treatments  Labs (all labs ordered are listed, but only abnormal results are displayed) Labs Reviewed  CBC - Abnormal; Notable for the following components:      Result Value   RDW 17.5 (*)    All other components within normal limits  BRAIN NATRIURETIC PEPTIDE - Abnormal; Notable for the following components:   B Natriuretic Peptide 909.5 (*)    All other components within normal limits  COMPREHENSIVE METABOLIC PANEL - Abnormal; Notable for the following components:   Potassium 5.2 (*)    CO2 18 (*)    BUN 27 (*)    Creatinine, Ser 2.02 (*)    Calcium 8.4 (*)    AST 14 (*)    GFR, Estimated 27 (*)    All other components within normal limits     EKG  EKG shows sinus rhythm with prolonged PR interval normal axis repolarization abnormality similar to prior   RADIOLOGY I reviewed and interpreted the CXR which does not show any acute cardiopulmonary process    PROCEDURES:  Critical Care performed: No  Procedures   MEDICATIONS ORDERED IN ED: Medications  furosemide (LASIX) injection 40 mg (40 mg Intravenous Given 06/08/22 1352)     IMPRESSION / MDM / ASSESSMENT AND PLAN / ED COURSE  I reviewed the triage vital signs and the nursing notes.                              Patient's presentation is most consistent with acute presentation with potential threat to life or bodily function. acute presentation with potential threat to life or bodily function. Differential diagnosis includes, but is not limited to, CHF exacerbation, venous stasis, renal failure, medication noncompliance  The patient is a 66 year old female who presents because of epistaxis and elevated blood pressure as well as lower extremity swelling.  Epistaxis started this morning and was short-lived.  She checked her blood pressure as a  result of the nosebleed and was in the 150s over 80s.  She has also had lower extremity swelling of the last several days but denies any frank shortness of breath or chest pain.  She is compliant with her Lasix actually took an extra dose yesterday.  Tells me she has had similar swelling in the past.  Reviewed Dr. Donivan Scull last note she has a history of diastolic heart failure and paroxysmal A-fib.  Labs show renal function is actually  improved from baseline, potassium is 5.2 just above the upper limit of normal.  Her BNP is elevated and around 900.  Chest x-ray does not have any obvious pulmonary edema.  She is satting 98% on my evaluation, pressure 150/80 she is not in any respiratory distress.  Does have pitting edema that symmetric.  Her epistaxis is stopped.  I am overall reassured that her blood pressure is not significantly elevated do not think it is contributing to the epistaxis today.  Given her hyperkalemia and increasing lower extremity swelling we will give a dose of IV Lasix.  She notes she has had elevated potassium in the past we discussed low potassium diet as well as following up with her either primary doctor or cardiologist for repeat blood work in about 1 week.  She is not on any ACE or ARB or spironolactone.        FINAL CLINICAL IMPRESSION(S) / ED DIAGNOSES   Final diagnoses:  Lower extremity edema  Hyperkalemia     Rx / DC Orders   ED Discharge Orders     None        Note:  This document was prepared using Dragon voice recognition software and may include unintentional dictation errors.   Rada Hay, MD 06/08/22 1407

## 2022-06-08 NOTE — ED Provider Triage Note (Signed)
  Emergency Medicine Provider Triage Evaluation Note  Dawn Sherman , a 66 y.o.female,  was evaluated in triage.  Pt complains of hypertension, epistaxis.  Patient states that she was eating breakfast when she had a nosebleed this morning.  She states that her blood pressure was running high and believes that may be related.  Her nosebleed stopped on its own.  She additionally states that her ankles have been more swollen than usual.  She took an extra Lasix which usually helps, but this time it did not.   Review of Systems  Positive: Epistaxis, ankle swelling Negative: Denies fever, chest pain, vomiting  Physical Exam   Vitals:   06/08/22 1045  BP: (!) 151/80  Pulse: 91  Resp: 18  Temp: 98.4 F (36.9 C)  SpO2: 94%   Gen:   Awake, no distress   Resp:  Normal effort  MSK:   Moves extremities without difficulty  Other:    Medical Decision Making  Given the patient's initial medical screening exam, the following diagnostic evaluation has been ordered. The patient will be placed in the appropriate treatment space, once one is available, to complete the evaluation and treatment. I have discussed the plan of care with the patient and I have advised the patient that an ED physician or mid-level practitioner will reevaluate their condition after the test results have been received, as the results may give them additional insight into the type of treatment they may need.    Diagnostics: Labs, EKG, CXR  Treatments: none immediately   Teodoro Spray, [place] 06/08/22 1246

## 2022-06-10 ENCOUNTER — Telehealth: Payer: Self-pay | Admitting: Cardiovascular Disease

## 2022-06-10 NOTE — Telephone Encounter (Signed)
LVM to schedule fu appt, please schedule appt

## 2022-06-12 ENCOUNTER — Telehealth: Payer: Self-pay | Admitting: Cardiovascular Disease

## 2022-06-12 NOTE — Telephone Encounter (Signed)
LVM to schedule ED follow up, please schedule with [facility] or APP

## 2022-06-14 NOTE — Telephone Encounter (Signed)
     Patient  visit on 10/20  at [facility]  Have you been able to follow up with your primary care physician? yes  The patient was or was not able to obtain any needed medicine or equipment. yes  Are there diet recommendations that you are having difficulty following? na  Patient expresses understanding of discharge instructions and education provided has no other needs at this time. yes     [place], Care Management  [phone] [address], [place], [place] [address] Phone: [phone] Email: [email or web address]

## 2022-06-17 ENCOUNTER — Ambulatory Visit
Admission: RE | Admit: 2022-06-17 | Discharge: 2022-06-17 | Disposition: A | Discharge status: Home / Self Care | Payer: Medicare Other | Source: Ambulatory Visit | Attending: Internal Medicine | Admitting: Internal Medicine

## 2022-06-17 DIAGNOSIS — Z1231 Encounter for screening mammogram for malignant neoplasm of breast: Secondary | ICD-10-CM | POA: Diagnosis present

## 2022-06-17 DIAGNOSIS — Z780 Asymptomatic menopausal state: Secondary | ICD-10-CM | POA: Diagnosis present

## 2022-06-20 ENCOUNTER — Telehealth: Payer: Self-pay | Admitting: Pharmacist

## 2022-06-20 NOTE — Progress Notes (Signed)
[facility])  Quality Team  [facility] Quality   06/20/2022  TALONDA ARTIST 12/07/1955 [id number]  Reason for referral: medication assistance  Referral source: [place]  Referral medication(s): Trelegy & Jardiance Current insurance:Medicare    Medication Assistance Findings:  Medication assistance needs identified: Spoke with the patient. HIPAA identifiers where obtained.  Patient confirmed she is still taking [place], Trelegy and Eliquis. She said she was denied Eliquis last year due to not spending the required amount on her medications.  She does appear to meet the requirements for Farxiga.  Trelegy requires patients to spend at least $600 in out of pocket expenses.  It is unclear if they will count this year's expenses for the 2024 benefit year.  Patient will be sent applications.   Additional medication assistance options reviewed with patient as warranted:  No other options identified  Plan: I will route patient assistance letter to [place] technician who will coordinate patient assistance program application process for medications listed above.  [facility] pharmacy technician will assist with obtaining all required documents from both patient and provider(s) and submit application(s) once completed.     Elayne Guerin, PharmD, [place] Clinical Pharmacist [phone]

## 2022-06-23 ENCOUNTER — Other Ambulatory Visit: Payer: Self-pay | Admitting: Internal Medicine

## 2022-06-24 ENCOUNTER — Encounter (INDEPENDENT_AMBULATORY_CARE_PROVIDER_SITE_OTHER): Payer: Self-pay

## 2022-06-27 ENCOUNTER — Telehealth: Payer: Self-pay | Admitting: Pharmacy Technician

## 2022-06-27 DIAGNOSIS — Z596 Low income: Secondary | ICD-10-CM

## 2022-06-27 NOTE — Progress Notes (Signed)
[place] [facility])                                            [place] Team    06/27/2022  Dawn Sherman 1956/05/18 [id number]                                       Medication Assistance Referral-FOR 2024 RE ENROLLMENT  Referral From: [place]  Medication/Company: [employer] / [place] Patient application portion:  Mailed Provider application portion: Faxed  to Dr. [name] Provider address/fax verified via: Office website   [name] [name] [name], [place]  [phone]

## 2022-06-30 ENCOUNTER — Emergency Department: Payer: Medicare Other

## 2022-06-30 ENCOUNTER — Inpatient Hospital Stay: Payer: Medicare Other

## 2022-06-30 ENCOUNTER — Inpatient Hospital Stay
Admission: EM | Admit: 2022-06-30 | Discharge: 2022-07-05 | DRG: 291 | Disposition: A | Discharge status: Home / Self Care | Payer: Medicare Other | Attending: Internal Medicine | Admitting: Internal Medicine

## 2022-06-30 ENCOUNTER — Encounter: Payer: Self-pay | Admitting: Intensive Care

## 2022-06-30 ENCOUNTER — Other Ambulatory Visit: Payer: Self-pay

## 2022-06-30 DIAGNOSIS — I4891 Unspecified atrial fibrillation: Secondary | ICD-10-CM | POA: Diagnosis not present

## 2022-06-30 DIAGNOSIS — R7989 Other specified abnormal findings of blood chemistry: Secondary | ICD-10-CM | POA: Diagnosis present

## 2022-06-30 DIAGNOSIS — I480 Paroxysmal atrial fibrillation: Secondary | ICD-10-CM | POA: Diagnosis present

## 2022-06-30 DIAGNOSIS — E872 Acidosis, unspecified: Secondary | ICD-10-CM | POA: Diagnosis present

## 2022-06-30 DIAGNOSIS — J441 Chronic obstructive pulmonary disease with (acute) exacerbation: Secondary | ICD-10-CM | POA: Diagnosis present

## 2022-06-30 DIAGNOSIS — J849 Interstitial pulmonary disease, unspecified: Secondary | ICD-10-CM | POA: Diagnosis present

## 2022-06-30 DIAGNOSIS — I5033 Acute on chronic diastolic (congestive) heart failure: Secondary | ICD-10-CM | POA: Diagnosis present

## 2022-06-30 DIAGNOSIS — I509 Heart failure, unspecified: Secondary | ICD-10-CM

## 2022-06-30 DIAGNOSIS — Z681 Body mass index (BMI) 19 or less, adult: Secondary | ICD-10-CM | POA: Diagnosis not present

## 2022-06-30 DIAGNOSIS — Z20822 Contact with and (suspected) exposure to covid-19: Secondary | ICD-10-CM | POA: Diagnosis present

## 2022-06-30 DIAGNOSIS — J9601 Acute respiratory failure with hypoxia: Secondary | ICD-10-CM | POA: Diagnosis present

## 2022-06-30 DIAGNOSIS — I130 Hypertensive heart and chronic kidney disease with heart failure and stage 1 through stage 4 chronic kidney disease, or unspecified chronic kidney disease: Secondary | ICD-10-CM | POA: Diagnosis present

## 2022-06-30 DIAGNOSIS — I4819 Other persistent atrial fibrillation: Secondary | ICD-10-CM | POA: Diagnosis present

## 2022-06-30 DIAGNOSIS — E440 Moderate protein-calorie malnutrition: Secondary | ICD-10-CM | POA: Diagnosis present

## 2022-06-30 DIAGNOSIS — I5032 Chronic diastolic (congestive) heart failure: Secondary | ICD-10-CM | POA: Diagnosis present

## 2022-06-30 DIAGNOSIS — F17210 Nicotine dependence, cigarettes, uncomplicated: Secondary | ICD-10-CM | POA: Diagnosis present

## 2022-06-30 DIAGNOSIS — E538 Deficiency of other specified B group vitamins: Secondary | ICD-10-CM | POA: Diagnosis present

## 2022-06-30 DIAGNOSIS — Z882 Allergy status to sulfonamides status: Secondary | ICD-10-CM

## 2022-06-30 DIAGNOSIS — K219 Gastro-esophageal reflux disease without esophagitis: Secondary | ICD-10-CM | POA: Diagnosis present

## 2022-06-30 DIAGNOSIS — F325 Major depressive disorder, single episode, in full remission: Secondary | ICD-10-CM | POA: Diagnosis present

## 2022-06-30 DIAGNOSIS — Z720 Tobacco use: Secondary | ICD-10-CM | POA: Diagnosis present

## 2022-06-30 DIAGNOSIS — J449 Chronic obstructive pulmonary disease, unspecified: Secondary | ICD-10-CM | POA: Diagnosis present

## 2022-06-30 DIAGNOSIS — I5020 Unspecified systolic (congestive) heart failure: Secondary | ICD-10-CM | POA: Diagnosis present

## 2022-06-30 DIAGNOSIS — Z888 Allergy status to other drugs, medicaments and biological substances status: Secondary | ICD-10-CM

## 2022-06-30 DIAGNOSIS — I10 Essential (primary) hypertension: Secondary | ICD-10-CM | POA: Diagnosis present

## 2022-06-30 DIAGNOSIS — Z8261 Family history of arthritis: Secondary | ICD-10-CM

## 2022-06-30 DIAGNOSIS — R9431 Abnormal electrocardiogram [ECG] [EKG]: Secondary | ICD-10-CM | POA: Diagnosis not present

## 2022-06-30 DIAGNOSIS — R339 Retention of urine, unspecified: Secondary | ICD-10-CM | POA: Diagnosis present

## 2022-06-30 DIAGNOSIS — I441 Atrioventricular block, second degree: Secondary | ICD-10-CM | POA: Diagnosis present

## 2022-06-30 DIAGNOSIS — Z83438 Family history of other disorder of lipoprotein metabolism and other lipidemia: Secondary | ICD-10-CM | POA: Diagnosis not present

## 2022-06-30 DIAGNOSIS — N184 Chronic kidney disease, stage 4 (severe): Secondary | ICD-10-CM | POA: Diagnosis present

## 2022-06-30 DIAGNOSIS — Z79899 Other long term (current) drug therapy: Secondary | ICD-10-CM | POA: Diagnosis not present

## 2022-06-30 DIAGNOSIS — Z885 Allergy status to narcotic agent status: Secondary | ICD-10-CM | POA: Diagnosis not present

## 2022-06-30 DIAGNOSIS — R0609 Other forms of dyspnea: Secondary | ICD-10-CM | POA: Diagnosis not present

## 2022-06-30 DIAGNOSIS — J9811 Atelectasis: Secondary | ICD-10-CM | POA: Diagnosis present

## 2022-06-30 DIAGNOSIS — I081 Rheumatic disorders of both mitral and tricuspid valves: Secondary | ICD-10-CM | POA: Diagnosis present

## 2022-06-30 DIAGNOSIS — E785 Hyperlipidemia, unspecified: Secondary | ICD-10-CM | POA: Diagnosis present

## 2022-06-30 DIAGNOSIS — Z7901 Long term (current) use of anticoagulants: Secondary | ICD-10-CM | POA: Diagnosis not present

## 2022-06-30 DIAGNOSIS — F32A Depression, unspecified: Secondary | ICD-10-CM | POA: Diagnosis present

## 2022-06-30 DIAGNOSIS — E782 Mixed hyperlipidemia: Secondary | ICD-10-CM | POA: Diagnosis present

## 2022-06-30 DIAGNOSIS — R000 Tachycardia, unspecified: Secondary | ICD-10-CM

## 2022-06-30 DIAGNOSIS — Z880 Allergy status to penicillin: Secondary | ICD-10-CM

## 2022-06-30 DIAGNOSIS — J432 Centrilobular emphysema: Secondary | ICD-10-CM | POA: Diagnosis not present

## 2022-06-30 HISTORY — DX: Chronic obstructive pulmonary disease, unspecified: J44.9

## 2022-06-30 LAB — CBC WITH DIFFERENTIAL/PLATELET
Abs Immature Granulocytes: 0.03 10*3/uL (ref 0.00–0.07)
Basophils Absolute: 0.1 10*3/uL (ref 0.0–0.1)
Basophils Relative: 1 %
Eosinophils Absolute: 0.2 10*3/uL (ref 0.0–0.5)
Eosinophils Relative: 2 %
HCT: 38.7 % (ref 36.0–46.0)
Hemoglobin: 12.3 g/dL (ref 12.0–15.0)
Immature Granulocytes: 0 %
Lymphocytes Relative: 12 %
Lymphs Abs: 1 10*3/uL (ref 0.7–4.0)
MCH: 28.3 pg (ref 26.0–34.0)
MCHC: 31.8 g/dL (ref 30.0–36.0)
MCV: 89.2 fL (ref 80.0–100.0)
Monocytes Absolute: 0.7 10*3/uL (ref 0.1–1.0)
Monocytes Relative: 8 %
Neutro Abs: 6.5 10*3/uL (ref 1.7–7.7)
Neutrophils Relative %: 77 %
Platelets: 188 10*3/uL (ref 150–400)
RBC: 4.34 MIL/uL (ref 3.87–5.11)
RDW: 16.9 % — ABNORMAL HIGH (ref 11.5–15.5)
WBC: 8.5 10*3/uL (ref 4.0–10.5)
nRBC: 0 % (ref 0.0–0.2)

## 2022-06-30 LAB — PROCALCITONIN: Procalcitonin: <0.1 ng/mL

## 2022-06-30 LAB — BRAIN NATRIURETIC PEPTIDE: B Natriuretic Peptide: 1518.4 pg/mL — ABNORMAL HIGH (ref 0.0–100.0)

## 2022-06-30 LAB — BLOOD GAS, ARTERIAL
Acid-base deficit: 8.6 mmol/L — ABNORMAL HIGH (ref 0.0–2.0)
Bicarbonate: 15.5 mmol/L — ABNORMAL LOW (ref 20.0–28.0)
Delivery systems: POSITIVE
Expiratory PAP: 5 cmH2O
FIO2: 45 %
Inspiratory PAP: 10 cmH2O
O2 Saturation: 98.1 %
Patient temperature: 37
pCO2 arterial: 28 mmHg — ABNORMAL LOW (ref 32–48)
pH, Arterial: 7.35 (ref 7.35–7.45)
pO2, Arterial: 98 mmHg (ref 83–108)

## 2022-06-30 LAB — LACTIC ACID, PLASMA: Lactic Acid, Venous: 0.9 mmol/L (ref 0.5–1.9)

## 2022-06-30 LAB — COMPREHENSIVE METABOLIC PANEL
ALT: 31 U/L (ref 0–44)
AST: 34 U/L (ref 15–41)
Albumin: 4.1 g/dL (ref 3.5–5.0)
Alkaline Phosphatase: 112 U/L (ref 38–126)
Anion gap: 8 (ref 5–15)
BUN: 38 mg/dL — ABNORMAL HIGH (ref 8–23)
CO2: 21 mmol/L — ABNORMAL LOW (ref 22–32)
Calcium: 8.9 mg/dL (ref 8.9–10.3)
Chloride: 110 mmol/L (ref 98–111)
Creatinine, Ser: 2.17 mg/dL — ABNORMAL HIGH (ref 0.44–1.00)
GFR, Estimated: 24 mL/min — ABNORMAL LOW (ref >60)
Glucose, Bld: 85 mg/dL (ref 70–99)
Potassium: 4.8 mmol/L (ref 3.5–5.1)
Sodium: 139 mmol/L (ref 135–145)
Total Bilirubin: 1 mg/dL (ref 0.3–1.2)
Total Protein: 7.2 g/dL (ref 6.5–8.1)

## 2022-06-30 LAB — SARS CORONAVIRUS 2 BY RT PCR: SARS Coronavirus 2 by RT PCR: NEGATIVE

## 2022-06-30 LAB — D-DIMER, QUANTITATIVE: D-Dimer, Quant: 1.64 ug/mL-FEU — ABNORMAL HIGH (ref 0.00–0.50)

## 2022-06-30 LAB — TROPONIN I (HIGH SENSITIVITY)
Troponin I (High Sensitivity): 59 ng/L — ABNORMAL HIGH (ref <18)
Troponin I (High Sensitivity): 55 ng/L — ABNORMAL HIGH (ref <18)

## 2022-06-30 MED ORDER — APIXABAN 2.5 MG PO TABS
2.5000 mg | ORAL_TABLET | Freq: Two times a day (BID) | ORAL | Status: DC
  Administered 2022-06-30 – 2022-07-05 (×10): 2.5 mg via ORAL
  Filled 2022-06-30 (×11): qty 1

## 2022-06-30 MED ORDER — DAPAGLIFLOZIN PROPANEDIOL 10 MG PO TABS
10.0000 mg | ORAL_TABLET | Freq: Every day | ORAL | Status: DC
  Filled 2022-06-30: qty 1

## 2022-06-30 MED ORDER — HYDRALAZINE HCL 25 MG PO TABS
25.0000 mg | ORAL_TABLET | Freq: Three times a day (TID) | ORAL | Status: DC
  Administered 2022-06-30 – 2022-07-05 (×14): 25 mg via ORAL
  Filled 2022-06-30 (×15): qty 1

## 2022-06-30 MED ORDER — UMECLIDINIUM BROMIDE 62.5 MCG/ACT IN AEPB
1.0000 | INHALATION_SPRAY | Freq: Every day | RESPIRATORY_TRACT | Status: DC
  Administered 2022-07-01: 1 via RESPIRATORY_TRACT
  Filled 2022-06-30: qty 7

## 2022-06-30 MED ORDER — ATORVASTATIN CALCIUM 20 MG PO TABS
40.0000 mg | ORAL_TABLET | Freq: Every day | ORAL | Status: DC
  Administered 2022-06-30 – 2022-07-04 (×5): 40 mg via ORAL
  Filled 2022-06-30 (×5): qty 2

## 2022-06-30 MED ORDER — METOPROLOL TARTRATE 5 MG/5ML IV SOLN
5.0000 mg | INTRAVENOUS | Status: DC | PRN
  Administered 2022-06-30 – 2022-07-01 (×2): 5 mg via INTRAVENOUS
  Filled 2022-06-30 (×2): qty 5

## 2022-06-30 MED ORDER — PREDNISONE 20 MG PO TABS
60.0000 mg | ORAL_TABLET | Freq: Once | ORAL | Status: AC
  Administered 2022-06-30: 60 mg via ORAL
  Filled 2022-06-30: qty 3

## 2022-06-30 MED ORDER — BUPROPION HCL ER (XL) 150 MG PO TB24
150.0000 mg | ORAL_TABLET | Freq: Every day | ORAL | Status: DC
  Administered 2022-07-01 – 2022-07-05 (×5): 150 mg via ORAL
  Filled 2022-06-30 (×5): qty 1

## 2022-06-30 MED ORDER — ACETAMINOPHEN 325 MG PO TABS: 650.0000 mg | ORAL_TABLET | Freq: Four times a day (QID) | ORAL | Status: DC | PRN

## 2022-06-30 MED ORDER — ONDANSETRON HCL 4 MG/2ML IJ SOLN: 4.0000 mg | Freq: Four times a day (QID) | INTRAMUSCULAR | Status: DC | PRN

## 2022-06-30 MED ORDER — IPRATROPIUM-ALBUTEROL 0.5-2.5 (3) MG/3ML IN SOLN
3.0000 mL | Freq: Once | RESPIRATORY_TRACT | Status: AC
  Administered 2022-06-30: 3 mL via RESPIRATORY_TRACT
  Filled 2022-06-30: qty 3

## 2022-06-30 MED ORDER — SODIUM CHLORIDE 0.9 % IV SOLN
1.0000 g | INTRAVENOUS | Status: DC
  Administered 2022-06-30 – 2022-07-03 (×4): 1 g via INTRAVENOUS
  Filled 2022-06-30: qty 10
  Filled 2022-06-30 (×2): qty 1
  Filled 2022-06-30: qty 10

## 2022-06-30 MED ORDER — NICOTINE 14 MG/24HR TD PT24: 14.0000 mg | MEDICATED_PATCH | Freq: Every day | TRANSDERMAL | Status: DC | PRN

## 2022-06-30 MED ORDER — ALBUTEROL SULFATE (2.5 MG/3ML) 0.083% IN NEBU: 2.5000 mg | INHALATION_SOLUTION | RESPIRATORY_TRACT | Status: DC | PRN

## 2022-06-30 MED ORDER — ALBUTEROL SULFATE HFA 108 (90 BASE) MCG/ACT IN AERS: 2.0000 | INHALATION_SPRAY | Freq: Four times a day (QID) | RESPIRATORY_TRACT | Status: DC | PRN

## 2022-06-30 MED ORDER — PANTOPRAZOLE SODIUM 40 MG PO TBEC
40.0000 mg | DELAYED_RELEASE_TABLET | Freq: Every day | ORAL | Status: DC
  Administered 2022-07-01 – 2022-07-05 (×5): 40 mg via ORAL
  Filled 2022-06-30 (×5): qty 1

## 2022-06-30 MED ORDER — SODIUM CHLORIDE 0.9 % IV SOLN
500.0000 mg | INTRAVENOUS | Status: DC
  Administered 2022-06-30 – 2022-07-02 (×3): 500 mg via INTRAVENOUS
  Filled 2022-06-30: qty 5
  Filled 2022-06-30: qty 500
  Filled 2022-06-30 (×2): qty 5

## 2022-06-30 MED ORDER — SENNOSIDES-DOCUSATE SODIUM 8.6-50 MG PO TABS: 1.0000 | ORAL_TABLET | Freq: Every evening | ORAL | Status: DC | PRN

## 2022-06-30 MED ORDER — CALCIUM CARBONATE 1250 (500 CA) MG PO TABS
1.0000 | ORAL_TABLET | Freq: Three times a day (TID) | ORAL | Status: DC
  Administered 2022-06-30 – 2022-07-05 (×14): 1250 mg via ORAL
  Filled 2022-06-30 (×17): qty 1

## 2022-06-30 MED ORDER — METHYLPREDNISOLONE SODIUM SUCC 40 MG IJ SOLR: 40.0000 mg | Freq: Every day | INTRAMUSCULAR | Status: DC

## 2022-06-30 MED ORDER — IPRATROPIUM-ALBUTEROL 0.5-2.5 (3) MG/3ML IN SOLN
3.0000 mL | Freq: Three times a day (TID) | RESPIRATORY_TRACT | Status: DC
  Administered 2022-06-30 – 2022-07-01 (×2): 3 mL via RESPIRATORY_TRACT
  Filled 2022-06-30: qty 3
  Filled 2022-06-30: qty 6

## 2022-06-30 MED ORDER — AMLODIPINE BESYLATE 10 MG PO TABS
10.0000 mg | ORAL_TABLET | Freq: Every day | ORAL | Status: DC
  Administered 2022-07-01 – 2022-07-02 (×2): 10 mg via ORAL
  Filled 2022-06-30: qty 1
  Filled 2022-06-30: qty 2

## 2022-06-30 MED ORDER — ACETAMINOPHEN 650 MG RE SUPP: 650.0000 mg | Freq: Four times a day (QID) | RECTAL | Status: DC | PRN

## 2022-06-30 MED ORDER — FLUTICASONE FUROATE-VILANTEROL 100-25 MCG/ACT IN AEPB
1.0000 | INHALATION_SPRAY | Freq: Every day | RESPIRATORY_TRACT | Status: DC
  Administered 2022-07-01: 1 via RESPIRATORY_TRACT
  Filled 2022-06-30: qty 28

## 2022-06-30 MED ORDER — FUROSEMIDE 10 MG/ML IJ SOLN
40.0000 mg | Freq: Once | INTRAMUSCULAR | Status: AC
  Administered 2022-06-30: 40 mg via INTRAVENOUS
  Filled 2022-06-30: qty 4

## 2022-06-30 MED ORDER — ONDANSETRON HCL 4 MG PO TABS: 4.0000 mg | ORAL_TABLET | Freq: Four times a day (QID) | ORAL | Status: DC | PRN

## 2022-06-30 MED ORDER — HYDRALAZINE HCL 20 MG/ML IJ SOLN: 5.0000 mg | Freq: Four times a day (QID) | INTRAMUSCULAR | Status: DC | PRN

## 2022-06-30 MED ORDER — VENLAFAXINE HCL ER 75 MG PO CP24
150.0000 mg | ORAL_CAPSULE | Freq: Every day | ORAL | Status: DC
  Administered 2022-07-01 – 2022-07-05 (×5): 150 mg via ORAL
  Filled 2022-06-30: qty 1
  Filled 2022-06-30 (×4): qty 2

## 2022-06-30 MED ORDER — ASPIRIN 81 MG PO CHEW
324.0000 mg | CHEWABLE_TABLET | Freq: Once | ORAL | Status: AC
  Administered 2022-06-30: 324 mg via ORAL
  Filled 2022-06-30: qty 4

## 2022-06-30 MED ORDER — FERROUS SULFATE 325 (65 FE) MG PO TABS
325.0000 mg | ORAL_TABLET | Freq: Two times a day (BID) | ORAL | Status: DC
  Administered 2022-06-30 – 2022-07-05 (×10): 325 mg via ORAL
  Filled 2022-06-30 (×10): qty 1

## 2022-06-30 MED ORDER — FUROSEMIDE 40 MG PO TABS
40.0000 mg | ORAL_TABLET | Freq: Every day | ORAL | Status: DC
  Administered 2022-07-01 – 2022-07-02 (×2): 40 mg via ORAL
  Filled 2022-06-30 (×2): qty 1

## 2022-06-30 NOTE — Assessment & Plan Note (Signed)
With shortness of breath - Presumptive diagnosis is secondary to right lower lobe pneumonia - Etiology work-up in progress - Checking procalcitonin, COVID PCR - Check lactic acid x2 - We will continue to monitor high sensitive troponin level - Ordered azithromycin 500 mg IV daily, 5 doses ordered; ceftriaxone 1 g IV daily, 5 doses

## 2022-06-30 NOTE — Hospital Course (Addendum)
Ms. Mairen Wallenstein is a 66 year old female with history of hypertension, GERD, hypertension, COPD not requiring O2 supplementation at baseline, tobacco use, CKD stage IV, paroxysmal atrial fibrillation on Eliquis, depression, anxiety, who presents emergency department for chief concerns of shortness of breath since 06/29/2022.  Initial vitals in the emergency department showed temperature 98.4, respiration rate of 22, heart rate of 93, blood pressure 148/77, SPO2 of 97% on 3 L nasal cannula.  Serum sodium is 139, potassium 4.8, chloride of 110, bicarb 21, BUN of 38, serum creatinine of 2.17, eGFR 24, nonfasting blood glucose 85, WBC 8.5, hemoglobin of 12.3, platelets of 188.  ED treatment: DuoNeb's x3, prednisone 60 mg p.o., furosemide 40 mg IV one-time dose, aspirin 324 mg.

## 2022-06-30 NOTE — Assessment & Plan Note (Signed)
-   Nicotine patch as needed ordered ?

## 2022-06-30 NOTE — ED Notes (Signed)
Advised nurse that patient has  a dirty bed  

## 2022-06-30 NOTE — Assessment & Plan Note (Signed)
-   Atorvastatin 40 mg nightly resumed 

## 2022-06-30 NOTE — Assessment & Plan Note (Signed)
-   Bupropion 150 mg daily, venlafaxine 150 mg daily with breakfast resumed

## 2022-06-30 NOTE — ED Notes (Signed)
Pt in and out cathed for urine.  1100 mL out.  NP notified.

## 2022-06-30 NOTE — ED Notes (Signed)
Pt bladder scanned by this RN due to not voiding since 1900 and receiving Lasix IV.    RN notes 556 mL in bladder at this time.

## 2022-06-30 NOTE — ED Provider Notes (Signed)
[facility] Provider Note    Event Date/Time   First MD Initiated Contact with Patient 06/30/22 1245     (approximate)   History   Shortness of Breath   HPI  Dawn Sherman is a 66 y.o. female with past medical history of COPD, CHF, CKD who presents with shortness of breath.  Patient symptoms started 2 days ago.  She has minimal other associated symptoms.  Denies any chest pain coughing nasal congestion sore throat.  Denies nausea vomiting or diarrhea or abdominal pain.  Denies lower extremity swelling.  Does have a history of both COPD and CHF.  She takes Lasix and inhalers which she has been compliant with.  Since last echo was from 11/2021 showing EF 65 to 70% with mild LVH and indeterminate diastolic parameters.  Mild MR.      Past Medical History:  Diagnosis Date   B12 deficiency    CHF (congestive heart failure) ([place])    CKD (chronic kidney disease), stage III (HCC)    COPD (chronic obstructive pulmonary disease) ([place])    Depression    Endometriosis    Hypertension    Mixed hyperlipidemia    Tobacco abuse     Patient Active Problem List   Diagnosis Date Noted   Anemia 05/06/2022   Acute on chronic diastolic CHF (congestive heart failure) ([place]) 03/26/2022   CKD (chronic kidney disease), stage IV ([place]) 03/26/2022   Iron deficiency anemia 03/26/2022   Generalized weakness 12/17/2021   Essential hypertension 12/17/2021   Dyslipidemia 12/17/2021   Depression, major, single episode, complete remission ([place]) 12/17/2021   GERD without esophagitis 12/17/2021   Chronic diastolic CHF (congestive heart failure) ([place]) 12/17/2021   AKI (acute kidney injury) ([place]) 12/16/2021   Second degree AV block    Syncope and collapse 12/10/2021   History of subarachnoid hemorrhage 12/10/2021   Hypokalemia 12/10/2021   Hyponatremia 12/10/2021   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   Atrial fibrillation with slow ventricular response ([place]) 12/10/2021   Hypotension  12/10/2021   Swelling of lower extremity 10/28/2021   Malnutrition of moderate degree 08/25/2021   Prediabetes 08/19/2021   Chronic obstructive pulmonary disease ([place]) 11/22/2020   CKD (chronic kidney disease) stage 3, GFR 30-59 ml/min (HCC) 08/12/2020   Paroxysmal A-fib ([place]) 08/12/2020   Dehydration    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[place])    Acute on chronic combined systolic and diastolic CHF (congestive heart failure) ([place]) 02/10/2020   HLD (hyperlipidemia) 02/10/2020   Elevated troponin 02/10/2020   GERD (gastroesophageal reflux disease) 02/10/2020   Abnormal mammogram 07/13/2019   Sleep concern 10/17/2018   History of alcohol abuse 07/31/2018   B12 deficiency 07/31/2018   Acute renal failure superimposed on stage 3b chronic kidney disease ([place]) 11/03/2016   Health care maintenance 12/02/2014   Tobacco abuse 12/02/2014   Foot pain, left 11/29/2014   Hypercholesterolemia 11/29/2014   Encounter for screening colonoscopy 12/16/2013   Breast mass 12/15/2012   Hypertension 07/05/2012   Depression, major, single episode, mild ([place]) 07/05/2012     Physical Exam  Triage Vital Signs: ED Triage Vitals  Enc Vitals Group     BP 06/30/22 1213 (!) 148/77     Pulse Rate 06/30/22 1213 93     Resp 06/30/22 1213 (!) 22     Temp 06/30/22 1213 98.4 F (36.9 C)     Temp Source 06/30/22 1213 Oral     SpO2 06/30/22 1213 97 %     Weight 06/30/22 1214 120 lb (  54.4 kg)     Height 06/30/22 1214 5\' 5"  (1.651 m)     Head Circumference --      Peak Flow --      Pain Score 06/30/22 1213 0     Pain Loc --      Pain Edu? --      Excl. in [place]? --     Most recent vital signs: Vitals:   06/30/22 1213  BP: (!) 148/77  Pulse: 93  Resp: (!) 22  Temp: 98.4 F (36.9 C)  SpO2: 97%     General: Awake, no distress.  CV:  Good peripheral perfusion.  Pitting edema bilateral lower extremities Resp:  Normal effort.  No significant expiratory wheezing however she has prolonged expiratory phase  and decreased air movement throughout; patient has conversational dyspnea Abd:  No distention.  Neuro:             Awake, Alert, Oriented x 3  Other:     ED Results / Procedures / Treatments  Labs (all labs ordered are listed, but only abnormal results are displayed) Labs Reviewed  CBC WITH DIFFERENTIAL/PLATELET - Abnormal; Notable for the following components:      Result Value   RDW 16.9 (*)    All other components within normal limits  COMPREHENSIVE METABOLIC PANEL - Abnormal; Notable for the following components:   CO2 21 (*)    BUN 38 (*)    Creatinine, Ser 2.17 (*)    GFR, Estimated 24 (*)    All other components within normal limits  BRAIN NATRIURETIC PEPTIDE - Abnormal; Notable for the following components:   B Natriuretic Peptide 1,518.4 (*)    All other components within normal limits  TROPONIN I (HIGH SENSITIVITY) - Abnormal; Notable for the following components:   Troponin I (High Sensitivity) 59 (*)    All other components within normal limits  SARS CORONAVIRUS 2 BY RT PCR  TROPONIN I (HIGH SENSITIVITY)     EKG  EKG reviewed interpreted myself shows sinus rhythm with normal axis normal intervals T wave inversions inferior leads and lateral precordial leads   RADIOLOGY Chest x-ray reviewed interpreted by myself shows possible asymmetric edema versus infiltrate on the right   PROCEDURES:  Critical Care performed: No  .1-3 Lead EKG Interpretation  Performed by: Rada Hay, MD Authorized by: Rada Hay, MD     Interpretation: normal     ECG rate assessment: normal     Rhythm: sinus rhythm     Ectopy: none     Conduction: normal   .Critical Care  Performed by: Rada Hay, MD Authorized by: Rada Hay, MD   Critical care provider statement:    Critical care time (minutes):  30   Critical care was time spent personally by me on the following activities:  Development of treatment plan with patient or surrogate,  discussions with consultants, evaluation of patient's response to treatment, examination of patient, ordering and review of laboratory studies, ordering and review of radiographic studies, ordering and performing treatments and interventions, pulse oximetry, re-evaluation of patient's condition and review of old charts   The patient is on the cardiac monitor to evaluate for evidence of arrhythmia and/or significant heart rate changes.   MEDICATIONS ORDERED IN ED: Medications  furosemide (LASIX) injection 40 mg (has no administration in time range)  aspirin chewable tablet 324 mg (has no administration in time range)  ipratropium-albuterol (DUONEB) 0.5-2.5 (3) MG/3ML nebulizer solution 3 mL (3 mLs Nebulization  Given 06/30/22 1357)  ipratropium-albuterol (DUONEB) 0.5-2.5 (3) MG/3ML nebulizer solution 3 mL (3 mLs Nebulization Given 06/30/22 1357)  ipratropium-albuterol (DUONEB) 0.5-2.5 (3) MG/3ML nebulizer solution 3 mL (3 mLs Nebulization Given 06/30/22 1357)  predniSONE (DELTASONE) tablet 60 mg (60 mg Oral Given 06/30/22 1356)     IMPRESSION / MDM / ASSESSMENT AND PLAN / ED COURSE  I reviewed the triage vital signs and the nursing notes.                              Patient's presentation is most consistent with acute presentation with potential threat to life or bodily function.  Differential diagnosis includes, but is not limited to, COPD exacerbation, CHF exacerbation, pneumonia, pneumothorax, ACS  Patient is a 66 year old female with both COPD and CHF who presents with shortness of breath.  Started 2 days ago she really has minimal other associated symptoms no viral symptoms no infectious symptoms and no chest pain or lower extremity swelling.  She is hypoxic.  Was 81% with EMS with ambulation.  She does not wear oxygen at baseline.  I took her off the oxygen in the room she desaturated to 90% so placed back on 2 L.  She is slightly dyspneic with talking but she is not in overt respiratory  distress does not have any increased work of breathing.  She has a prolonged expiratory phase and decreased air movement.  Will trial bronchodilators and see if this opens her up and we can identify some wheezing afterward.  Will order chest x-ray labs.  We will also give steroids.  Differential is as above.  Chest x-ray is somewhat indeterminate possibly shows asymmetric edema on the right versus atypical infection.  Patient has not had any infectious symptoms.  After DuoNebs she is wheezing now which makes me think there could be component of COPD.  Her BNP is elevated with her lower extremity edema we will treat with Lasix.  Patient will require admission for any hypoxic respiratory failure      FINAL CLINICAL IMPRESSION(S) / ED DIAGNOSES   Final diagnoses:  Acute respiratory failure with hypoxia (HCC)  Acute on chronic congestive heart failure, unspecified heart failure type ([place])     Rx / DC Orders   ED Discharge Orders     None        Note:  This document was prepared using Dragon voice recognition software and may include unintentional dictation errors.   Rada Hay, MD 06/30/22 [phone]

## 2022-06-30 NOTE — Assessment & Plan Note (Signed)
-   Resumed home amlodipine 10 mg daily, furosemide 40 mg daily, hydralazine 25 mg 3 times daily - Hydralazine 5 mg IV every 6 hours as needed for SBP greater than 180

## 2022-06-30 NOTE — Progress Notes (Signed)
       CROSS COVER NOTE  NAME: SCOTTY PINDER MRN: [id number] DOB : [birth year] ATTENDING PHYSICIAN: Cox, Amy N, DO    Date of Service   06/30/2022   HPI/Events of Note   Notified of hypoxic event. SPO2 84% on 6L [place] and now requiring 15L via NRB.   On bedside evaluation M(r)s [place] has increased work of breathing and bilateral expiratory wheezes on auscultation. Temp 36.9C BP 140/86 HR 90 RR 19 SPO2 92% on NRB.  M(r)s Done is a 66 year old female with history of hypertension, GERD, hypertension, COPD not requiring O2 supplementation at baseline, tobacco use, CKD stage IV, paroxysmal atrial fibrillation on Eliquis, depression, anxiety, who presents emergency department for chief concerns of shortness of breath since 06/29/2022.    Interventions   Assessment/Plan:  Acute Hypoxic Respiratory Failure Bipap ABG- pH 7.35 pCO2 28 pO2 98 Bicarbonate 15.5 O2 Saturation 98.1 CXR- Chronic interstitial lung disease with mild pulmonary vascular congestion. (R) basilar atelectasis and/or infiltrate Lasix 40 mg IV x1 VQ Scan pending  Urinary Retention- Bladder scan 556 mL, patient unable to void In and Out Cath Bladder Scan q6H if patient has not voided      This document was prepared using Dragon voice recognition software and may include unintentional dictation errors.  Neomia Glass DNP, MBA, FNP-BC Nurse Practitioner [facility] Pager [phone]

## 2022-06-30 NOTE — Assessment & Plan Note (Addendum)
-   In acute exacerbation - Resume home maintenance inhaler - Home albuterol inhalation have not been continued on admission - Scheduled DuoNebs 3 times daily, 3 doses ordered

## 2022-06-30 NOTE — ED Notes (Signed)
Pt O2 sat 84% on 4L [place] upon this RN arrival.  RN turned  to 6L and O2 sat did not increase.  RN placed pt on nonrebreather at 15L, pt O2 sat. Increased to 92%.    RN notified NP over telephone of patient current condition.  RN will administer Duoneb breathing treatment and monitor patient.  NP states she will come and see patient shortly, and order repeat chest X-ray

## 2022-06-30 NOTE — Assessment & Plan Note (Signed)
-   Eliquis 2.5 mg p.o. twice daily resumed

## 2022-06-30 NOTE — Assessment & Plan Note (Signed)
PPI ?

## 2022-06-30 NOTE — ED Notes (Signed)
Pt SOB continued to become worse and work of breathing increased.  RT notified and requested to come to room with bipap.  RT and NP arrived to room and patient placed on Bipap and was able to receive breathing Tx.  Pt reports feeling better with bipap on.  RN notes substantial decrease in work of breathing, O2 sat maintaining at 96% at this time.

## 2022-06-30 NOTE — Assessment & Plan Note (Signed)
-   Appears to be at baseline 

## 2022-06-30 NOTE — ED Triage Notes (Signed)
Pt presents via EMS c/o SOB since yesterday. Reports hx of COPD. EMS reports 81% saturation with ambulation. Currently on 3L 02 per EMS. Pt does not wear 02 at baseline.

## 2022-06-30 NOTE — H&P (Addendum)
History and Physical   Dawn Sherman:[id number] DOB: 13-Mar-1956 DOA: 06/30/2022  PCP: [name], MD  Patient coming from: Home via EMS  I have personally briefly reviewed patient's old medical records in [place].  Chief Concern: Short of breath  HPI: Dawn Sherman is a 66 year old female with history of hypertension, GERD, hypertension, COPD not requiring O2 supplementation at baseline, tobacco use, CKD stage IV, paroxysmal atrial fibrillation on Eliquis, depression, anxiety, who presents emergency department for chief concerns of shortness of breath since 06/29/2022.  Initial vitals in the emergency department showed temperature 98.4, respiration rate of 22, heart rate of 93, blood pressure 148/77, SPO2 of 97% on 3 L nasal cannula.  Serum sodium is 139, potassium 4.8, chloride of 110, bicarb 21, BUN of 38, serum creatinine of 2.17, eGFR 24, nonfasting blood glucose 85, WBC 8.5, hemoglobin of 12.3, platelets of 188.  ED treatment: DuoNeb's x3, prednisone 60 mg p.o., furosemide 40 mg IV one-time dose, aspirin 324 mg. -------------------------------------------------- At bedside patient is able to tell me her name, her age, she knows she is in the hospital and she knows the current calendar year.  She does not appear to be in acute distress. She is calm and able to provide full HPI. There is mild increased accessory respiratory muscle use.  She reports she developed shortness of breath that is new to her on 06/29/2022.  She reports that at baseline she does not require oxygen supplementation.  She is a current tobacco user and is not ready to quit.  She reports the shortness of breath is worse with exertion however is not so bad now that she is in the hospital and laying down.  She states that her symptoms have improved with the breathing treatments.  She denies chest pain, fever, chills, cough, known sick contacts, trauma to her person, nausea, vomiting, abdominal pain,  dysuria, hematuria, diarrhea, pain and/or swelling of her lower extremities, international travel or long road trips.  Social history: She is at home with her husband and her grandson.  She endorses current tobacco use smoking less than half a pack per day.  She states she is not ready to quit.  She denies EtOH and recreational drug use.  She is currently retired.  ROS: Constitutional: no weight change, no fever ENT/Mouth: no sore throat, no rhinorrhea Eyes: no eye pain, no vision changes Cardiovascular: no chest pain, + dyspnea,  no edema, no palpitations Respiratory: no cough, no sputum, no wheezing Gastrointestinal: no nausea, no vomiting, no diarrhea, no constipation Genitourinary: no urinary incontinence, no dysuria, no hematuria Musculoskeletal: no arthralgias, no myalgias Skin: no skin lesions, no pruritus, Neuro: + weakness, no loss of consciousness, no syncope Psych: no anxiety, no depression, no decrease appetite Heme/Lymph: no bruising, no bleeding  ED Course: Discussed with emergency medicine provider, patient requiring hospitalization for chief concerns of acute hypoxic respiratory failure requiring oxygen supplementation.  Assessment/Plan  Principal Problem:   Acute hypoxemic respiratory failure (HCC) Active Problems:   Chronic obstructive pulmonary disease (HCC)   Tachycardia   Paroxysmal A-fib (HCC)   CKD (chronic kidney disease), stage IV (HCC)   Hypertension   HLD (hyperlipidemia)   Tobacco abuse   GERD (gastroesophageal reflux disease)   Elevated troponin   Dyslipidemia   Depression, major, single episode, complete remission (HCC)   Assessment and Plan:  * Acute hypoxemic respiratory failure (HCC) With shortness of breath - Presumptive diagnosis is secondary to right lower lobe pneumonia - Etiology work-up  in progress - Checking procalcitonin, COVID PCR - Check lactic acid x2 - We will continue to monitor high sensitive troponin level - Ordered  azithromycin 500 mg IV daily, 5 doses ordered; ceftriaxone 1 g IV daily, 5 doses  Tachycardia - With irregular rhythm consistent with atrial fibrillation - This is multifactorial given patient recently received DuoNeb treatment which can cause tachycardia and patient has right lower lobe pneumonia and COPD exacerbation - Patient currently stable at bedside and answered all questions - I will check a stat D-dimer, if positive we can consider ordering a VQ scan if her heart rate does not improve tomorrow - Given her CKD stage IV which is currently at baseline, we will avoid contrast and nephrotoxic agents - Metoprolol tartrate 5 mg IV every 3 hours as needed for heart rate greater than 120, 3 doses ordered  Chronic obstructive pulmonary disease (HCC) - In acute exacerbation - Resume home maintenance inhaler - Home albuterol inhalation have not been continued on admission - Scheduled DuoNebs 3 times daily, 3 doses ordered  Paroxysmal A-fib (HCC) - Eliquis 2.5 mg p.o. twice daily resumed  CKD (chronic kidney disease), stage IV (HCC) - Appears to be at baseline  Hypertension - Resumed home amlodipine 10 mg daily, furosemide 40 mg daily, hydralazine 25 mg 3 times daily - Hydralazine 5 mg IV every 6 hours as needed for SBP greater than 180  HLD (hyperlipidemia) - Atorvastatin 40 mg nightly resumed  Tobacco abuse - Nicotine patch as needed ordered  GERD (gastroesophageal reflux disease) - PPI  Depression, major, single episode, complete remission (HCC) - Bupropion 150 mg daily, venlafaxine 150 mg daily with breakfast resumed  Dyslipidemia - Atorvastatin 40 mg nightly resumed  Elevated troponin - Downtrending high sensitive troponin - Low clinical suspicion for ACS at this time as patient denies chest pain and EKG is negative for ischemic changes - Presumptive etiology is secondary to acute hypoxic respiratory failure in setting of pneumonia and a patient with COPD and continued  tobacco use  Chart reviewed.   DVT prophylaxis: Eliquis Code Status: Full code Diet: Heart healthy Family Communication: A phone call was offered, patient declined stating that her husband just left and he knows she is being admitted to the hospital Disposition Plan: Pending clinical course Consults called: None at this time Admission status: Telemetry medical, inpatient  Past Medical History:  Diagnosis Date   B12 deficiency    CHF (congestive heart failure) ([place])    CKD (chronic kidney disease), stage III ([place])    COPD (chronic obstructive pulmonary disease) ([place])    Depression    Endometriosis    Hypertension    Mixed hyperlipidemia    Tobacco abuse    Past Surgical History:  Procedure Laterality Date   ABDOMINAL HYSTERECTOMY     BREAST BIOPSY Right 2015   neg-  FIBROADENOMA   TAH/RSO  1999   secondary to bleeding and endometriosis (Dr Vernie Ammons)   Social History:  reports that she has been smoking cigarettes. She has a 10.00 pack-year smoking history. She has never used smokeless tobacco. She reports that she does not currently use alcohol. She reports that she does not use drugs.  Allergies  Allergen Reactions   Sulfate Rash   Codeine Sulfate Nausea Only   Benicar [Olmesartan]     Talked with patient February 10, 2020, intolerance is unclear, tried several medications around that time and one of them gave her a rash but she is not clear which 1.  Amoxicillin Rash   Clindamycin/Lincomycin Rash   Morphine And Related Rash   Penicillins Rash   Family History  Problem Relation Age of Onset   Hypercholesterolemia Mother    Rheum arthritis Father    Rheum arthritis Daughter    Fibromyalgia Daughter    Breast cancer Neg Hx    Colon cancer Neg Hx    Family history: Family history reviewed and not pertinent  Prior to Admission medications   Medication Sig Start Date End Date Taking? Authorizing Provider  acetaminophen (TYLENOL) 325 MG tablet Take 650 mg by mouth  every 6 (six) hours as needed.    [provider]  albuterol (VENTOLIN HFA) 108 (90 Base) MCG/ACT inhaler Inhale 2 puffs into the lungs every 6 (six) hours as needed for wheezing or shortness of breath. 03/14/22   Lucrezia Starch, MD  amLODipine (NORVASC) 10 MG tablet Take 1 tablet (10 mg total) by mouth daily. 04/08/22   Minna Merritts, MD  apixaban (ELIQUIS) 2.5 MG TABS tablet Take 1 tablet (2.5 mg total) by mouth 2 (two) times daily. 04/08/22   Minna Merritts, MD  atorvastatin (LIPITOR) 40 MG tablet TAKE ONE TABLET BY MOUTH EVERY DAY 05/29/22   [name], MD  buPROPion (WELLBUTRIN XL) 150 MG 24 hr tablet TAKE ONE TABLET BY MOUTH EVERY DAY 04/11/22   [name], MD  calcium carbonate (OS-CAL - DOSED IN MG OF ELEMENTAL CALCIUM) 1250 (500 Ca) MG tablet Take 1 tablet (500 mg of elemental calcium total) by mouth 3 (three) times daily with meals. 12/14/21   Fritzi Mandes, MD  FARXIGA 10 MG TABS tablet Take 10 mg by mouth daily. 11/20/21   [provider]  ferrous sulfate 325 (65 FE) MG tablet Take 1 tablet (325 mg total) by mouth 2 (two) times daily with a meal. 12/14/21   Fritzi Mandes, MD  Fluticasone-Umeclidin-Vilant (TRELEGY ELLIPTA) 100-62.5-25 MCG/ACT AEPB Inhale 1 puff into the lungs daily. 03/05/22   [name], MD  furosemide (LASIX) 40 MG tablet Take 1 tablet (40 mg total) by mouth daily. Take 40 mg daily and and extra 40 mg as needed for fluid retention 04/24/22   Darylene Price A, FNP  hydrALAZINE (APRESOLINE) 25 MG tablet Take 1 tablet (25 mg total) by mouth 3 (three) times daily. 01/31/22   Alisa Graff, FNP  loratadine (CLARITIN) 10 MG tablet Take 10 mg by mouth daily.    [provider]  omeprazole (PRILOSEC) 20 MG capsule Take 20 mg by mouth daily.    [provider]  venlafaxine XR (EFFEXOR-XR) 150 MG 24 hr capsule TAKE ONE CAPSULE BY MOUTH EVERY DAY 06/24/22   [name], MD   Physical Exam: Vitals:   06/30/22 1400 06/30/22 1500  06/30/22 1530 06/30/22 1539  BP: (!) 150/87 (!) 151/94 (!) 122/92   Pulse: 98 (!) 122 (!) 113 (!) 137  Resp: (!) 23 (!) 21 19 (!) 24  Temp:      TempSrc:      SpO2: 96% 91% 90% 90%  Weight:      Height:       Constitutional: appears age-appropriate, NAD, calm, comfortable Eyes: PERRL, lids and conjunctivae normal ENMT: Mucous membranes are moist. Posterior pharynx clear of any exudate or lesions. Age-appropriate dentition. Hearing appropriate Neck: normal, supple, no masses, no thyromegaly Respiratory: clear to auscultation bilaterally, mild bilateral wheezing on auscultation.  Mild increase respiratory effort.  Mild increased accessory muscle use.  Cardiovascular: Regular rate and rhythm, no murmurs /  rubs / gallops. No extremity edema. 2+ pedal pulses. No carotid bruits.  Abdomen: no tenderness, no masses palpated, no hepatosplenomegaly. Bowel sounds positive.  Musculoskeletal: no clubbing / cyanosis. No joint deformity upper and lower extremities. Good ROM, no contractures, no atrophy. Normal muscle tone.  Skin: no rashes, lesions, ulcers. No induration Neurologic: Sensation intact. Strength 5/5 in all 4.  Psychiatric: Normal judgment and insight. Alert and oriented x 3. Normal mood.   EKG: independently reviewed, showing sinus rhythm with rate of 92, QTc 400  Chest x-ray on Admission: I personally reviewed and I agree with radiologist reading as below.  DG Chest 2 View  Result Date: 06/30/2022 CLINICAL DATA:  Shortness of breath EXAM: CHEST - 2 VIEW COMPARISON:  06/08/2022 FINDINGS: Mild cardiomegaly, stable. Mild vascular congestion. Increasing interstitial opacities at the right lung base. Trace right pleural effusion. No pneumothorax. Scoliotic spinal curvature. Remote left-sided rib fractures. IMPRESSION: Increasing interstitial opacities at the right lung base with trace right pleural effusion. Findings may be related to asymmetric edema or atypical/viral infection.  Electronically Signed   By: Davina Poke D.O.   On: 06/30/2022 13:16    Labs on Admission: I have personally reviewed following labs  CBC: Recent Labs  Lab 06/30/22 1227  WBC 8.5  NEUTROABS 6.5  HGB 12.3  HCT 38.7  MCV 89.2  PLT 242   Basic Metabolic Panel: Recent Labs  Lab 06/30/22 1227  NA 139  K 4.8  CL 110  CO2 21*  GLUCOSE 85  BUN 38*  CREATININE 2.17*  CALCIUM 8.9   GFR: Estimated Creatinine Clearance: 21.6 mL/min (A) (by C-G formula based on SCr of 2.17 mg/dL (H)).  Liver Function Tests: Recent Labs  Lab 06/30/22 1227  AST 34  ALT 31  ALKPHOS 112  BILITOT 1.0  PROT 7.2  ALBUMIN 4.1   Urine analysis:    Component Value Date/Time   COLORURINE YELLOW (A) 03/25/2022 2210   APPEARANCEUR CLEAR (A) 03/25/2022 2210   LABSPEC 1.016 03/25/2022 2210   PHURINE 5.0 03/25/2022 2210   GLUCOSEU NEGATIVE 03/25/2022 2210   GLUCOSEU NEGATIVE 01/22/2017 1052   HGBUR NEGATIVE 03/25/2022 2210   BILIRUBINUR NEGATIVE 03/25/2022 2210   KETONESUR NEGATIVE 03/25/2022 2210   PROTEINUR >=300 (A) 03/25/2022 2210   UROBILINOGEN 0.2 01/22/2017 1052   NITRITE NEGATIVE 03/25/2022 2210   LEUKOCYTESUR NEGATIVE 03/25/2022 2210   Dr. Tobie Poet [facility]  If 7PM-7AM, please contact overnight-coverage provider If 7AM-7PM, please contact day coverage provider [email or web address]  06/30/2022, 4:56 PM

## 2022-06-30 NOTE — Assessment & Plan Note (Addendum)
-   With irregular rhythm consistent with atrial fibrillation - This is multifactorial given patient recently received DuoNeb treatment which can cause tachycardia and patient has right lower lobe pneumonia and COPD exacerbation - Patient currently stable at bedside and answered all questions - I will check a stat D-dimer, if positive we can consider ordering a VQ scan if her heart rate does not improve tomorrow - Given her CKD stage IV which is currently at baseline, we will avoid contrast and nephrotoxic agents - Metoprolol tartrate 5 mg IV every 3 hours as needed for heart rate greater than 120, 3 doses ordered

## 2022-06-30 NOTE — Assessment & Plan Note (Signed)
-   Downtrending high sensitive troponin - Low clinical suspicion for ACS at this time as patient denies chest pain and EKG is negative for ischemic changes - Presumptive etiology is secondary to acute hypoxic respiratory failure in setting of pneumonia and a patient with COPD and continued tobacco use

## 2022-07-01 ENCOUNTER — Inpatient Hospital Stay: Payer: Medicare Other

## 2022-07-01 ENCOUNTER — Encounter: Payer: Self-pay | Admitting: Internal Medicine

## 2022-07-01 ENCOUNTER — Inpatient Hospital Stay: Payer: Medicare Other | Admitting: Oncology

## 2022-07-01 ENCOUNTER — Inpatient Hospital Stay (HOSPITAL_COMMUNITY)
Admit: 2022-07-01 | Discharge: 2022-07-01 | Disposition: A | Discharge status: Home / Self Care | Payer: Medicare Other | Attending: Physician Assistant | Admitting: Physician Assistant

## 2022-07-01 DIAGNOSIS — R0609 Other forms of dyspnea: Secondary | ICD-10-CM

## 2022-07-01 DIAGNOSIS — J9601 Acute respiratory failure with hypoxia: Secondary | ICD-10-CM | POA: Diagnosis not present

## 2022-07-01 DIAGNOSIS — J432 Centrilobular emphysema: Secondary | ICD-10-CM

## 2022-07-01 DIAGNOSIS — I509 Heart failure, unspecified: Secondary | ICD-10-CM

## 2022-07-01 DIAGNOSIS — I480 Paroxysmal atrial fibrillation: Secondary | ICD-10-CM | POA: Diagnosis not present

## 2022-07-01 DIAGNOSIS — N184 Chronic kidney disease, stage 4 (severe): Secondary | ICD-10-CM

## 2022-07-01 DIAGNOSIS — R9431 Abnormal electrocardiogram [ECG] [EKG]: Secondary | ICD-10-CM

## 2022-07-01 DIAGNOSIS — I4891 Unspecified atrial fibrillation: Secondary | ICD-10-CM

## 2022-07-01 LAB — ECHOCARDIOGRAM COMPLETE
AR max vel: 1.82 cm2
AV Area VTI: 1.92 cm2
AV Area mean vel: 1.65 cm2
AV Mean grad: 8 mmHg
AV Peak grad: 12.8 mmHg
Ao pk vel: 1.79 m/s
Area-P 1/2: 6.32 cm2
Height: 65 in
S' Lateral: 4 cm
Weight: 1920 oz

## 2022-07-01 LAB — D-DIMER, QUANTITATIVE: D-Dimer, Quant: 1.06 ug/mL-FEU — ABNORMAL HIGH (ref 0.00–0.50)

## 2022-07-01 LAB — BASIC METABOLIC PANEL
Anion gap: 7 (ref 5–15)
BUN: 41 mg/dL — ABNORMAL HIGH (ref 8–23)
CO2: 20 mmol/L — ABNORMAL LOW (ref 22–32)
Calcium: 8.9 mg/dL (ref 8.9–10.3)
Chloride: 114 mmol/L — ABNORMAL HIGH (ref 98–111)
Creatinine, Ser: 2.53 mg/dL — ABNORMAL HIGH (ref 0.44–1.00)
GFR, Estimated: 20 mL/min — ABNORMAL LOW (ref >60)
Glucose, Bld: 114 mg/dL — ABNORMAL HIGH (ref 70–99)
Potassium: 5.1 mmol/L (ref 3.5–5.1)
Sodium: 141 mmol/L (ref 135–145)

## 2022-07-01 LAB — CBC
HCT: 37.8 % (ref 36.0–46.0)
Hemoglobin: 12 g/dL (ref 12.0–15.0)
MCH: 28.4 pg (ref 26.0–34.0)
MCHC: 31.7 g/dL (ref 30.0–36.0)
MCV: 89.4 fL (ref 80.0–100.0)
Platelets: 160 10*3/uL (ref 150–400)
RBC: 4.23 MIL/uL (ref 3.87–5.11)
RDW: 17 % — ABNORMAL HIGH (ref 11.5–15.5)
WBC: 5.6 10*3/uL (ref 4.0–10.5)
nRBC: 0 % (ref 0.0–0.2)

## 2022-07-01 MED ORDER — TECHNETIUM TO 99M ALBUMIN AGGREGATED
4.4300 | Freq: Once | INTRAVENOUS | Status: AC
  Administered 2022-07-01: 4.43 via INTRAVENOUS

## 2022-07-01 MED ORDER — ADULT MULTIVITAMIN W/MINERALS CH
1.0000 | ORAL_TABLET | Freq: Every day | ORAL | Status: DC
  Administered 2022-07-01 – 2022-07-05 (×5): 1 via ORAL
  Filled 2022-07-01 (×5): qty 1

## 2022-07-01 MED ORDER — LEVALBUTEROL HCL 0.63 MG/3ML IN NEBU: 0.6300 mg | INHALATION_SOLUTION | Freq: Two times a day (BID) | RESPIRATORY_TRACT | Status: DC

## 2022-07-01 MED ORDER — ARFORMOTEROL TARTRATE 15 MCG/2ML IN NEBU
15.0000 ug | INHALATION_SOLUTION | Freq: Two times a day (BID) | RESPIRATORY_TRACT | Status: DC
  Administered 2022-07-01 – 2022-07-03 (×5): 15 ug via RESPIRATORY_TRACT
  Filled 2022-07-01 (×6): qty 2

## 2022-07-01 MED ORDER — LEVALBUTEROL HCL 0.63 MG/3ML IN NEBU
0.6300 mg | INHALATION_SOLUTION | Freq: Two times a day (BID) | RESPIRATORY_TRACT | Status: DC
  Administered 2022-07-02 – 2022-07-05 (×7): 0.63 mg via RESPIRATORY_TRACT
  Filled 2022-07-01 (×7): qty 3

## 2022-07-01 MED ORDER — IPRATROPIUM BROMIDE 0.02 % IN SOLN
0.5000 mg | Freq: Four times a day (QID) | RESPIRATORY_TRACT | Status: DC
  Administered 2022-07-01 (×2): 0.5 mg via RESPIRATORY_TRACT
  Filled 2022-07-01 (×2): qty 2.5

## 2022-07-01 MED ORDER — IPRATROPIUM BROMIDE 0.02 % IN SOLN
0.5000 mg | Freq: Two times a day (BID) | RESPIRATORY_TRACT | Status: DC
  Administered 2022-07-02 – 2022-07-05 (×7): 0.5 mg via RESPIRATORY_TRACT
  Filled 2022-07-01 (×7): qty 2.5

## 2022-07-01 MED ORDER — SODIUM BICARBONATE 650 MG PO TABS
650.0000 mg | ORAL_TABLET | Freq: Two times a day (BID) | ORAL | Status: DC
  Administered 2022-07-01 – 2022-07-05 (×9): 650 mg via ORAL
  Filled 2022-07-01 (×10): qty 1

## 2022-07-01 MED ORDER — METHYLPREDNISOLONE SODIUM SUCC 40 MG IJ SOLR
40.0000 mg | Freq: Every day | INTRAMUSCULAR | Status: DC
  Administered 2022-07-01 – 2022-07-03 (×3): 40 mg via INTRAVENOUS
  Filled 2022-07-01 (×3): qty 1

## 2022-07-01 MED ORDER — LEVALBUTEROL HCL 0.63 MG/3ML IN NEBU
0.6300 mg | INHALATION_SOLUTION | Freq: Four times a day (QID) | RESPIRATORY_TRACT | Status: DC
  Administered 2022-07-01 (×2): 0.63 mg via RESPIRATORY_TRACT
  Filled 2022-07-01 (×2): qty 3

## 2022-07-01 MED ORDER — TAMSULOSIN HCL 0.4 MG PO CAPS
0.4000 mg | ORAL_CAPSULE | Freq: Every day | ORAL | Status: DC
  Administered 2022-07-01 – 2022-07-05 (×5): 0.4 mg via ORAL
  Filled 2022-07-01 (×5): qty 1

## 2022-07-01 MED ORDER — IPRATROPIUM BROMIDE 0.02 % IN SOLN: 0.5000 mg | Freq: Four times a day (QID) | RESPIRATORY_TRACT | Status: DC

## 2022-07-01 MED ORDER — BUDESONIDE 0.25 MG/2ML IN SUSP
0.2500 mg | Freq: Two times a day (BID) | RESPIRATORY_TRACT | Status: DC
  Administered 2022-07-01 – 2022-07-03 (×5): 0.25 mg via RESPIRATORY_TRACT
  Filled 2022-07-01 (×5): qty 2

## 2022-07-01 NOTE — Discharge Instructions (Signed)

## 2022-07-01 NOTE — ED Notes (Signed)
Pt transported to nuclear medicine. 

## 2022-07-01 NOTE — Progress Notes (Signed)
Initial Nutrition Assessment  DOCUMENTATION CODES:   Not applicable  INTERVENTION:   -Liberalize diet to 2 gram sodium for wider variety of meal selections -MVI with minerals daily -Magic cup TID with meals, each supplement provides 290 kcal and 9 grams of protein  -RD provided "Low Sodium Nutrition Therapy" handout from [facility] Nutrition Care Manual; attached to AVS/ discharge summary  NUTRITION DIAGNOSIS:   Increased nutrient needs related to chronic illness (CHF, COPD) as evidenced by estimated needs.  GOAL:   Patient will meet greater than or equal to 90% of their needs  MONITOR:   PO intake, Supplement acceptance  REASON FOR ASSESSMENT:   Rounds    ASSESSMENT:   Pt with history of hypertension, GERD, hypertension, COPD not requiring O2 supplementation at baseline, tobacco use, CKD stage IV, paroxysmal atrial fibrillation on Eliquis, depression, anxiety, who presents for chief concerns of shortness of breath.  Pt admitted with acute hypoxemic respiratory failure secondary to rt lower lobe pneumonia.   Reviewed I/O's: -55 ml x 24 hours  UOP: 400 ml x 24 hours   Pt unavailable at time of visit. RD unable to obtain further nutrition-related history or complete nutrition-focused physical exam at this time.    Pt currently on a heart healthy duet. No meal completion data available to assess at this time.   Reviewed wt hx; wt has been stable over the past 3 months. Pt with history of moderate malnutrition on previous admission; pt would greatly benefit from addition of oral nutrition supplements. Noted pt has refused supplement drinks in the past.   Medications reviewed and include calcium carbonate, ferrous sulfate, lasix, and solu-medrol.  Labs reviewed: CBGS: 103.    Diet Order:   Diet Order             Diet Heart Room service appropriate? Yes; Fluid consistency: Thin  Diet effective now                   EDUCATION NEEDS:   No education needs have been  identified at this time  Skin:  Skin Assessment: Reviewed RN Assessment  Last BM:  Unknown  Height:   Ht Readings from Last 1 Encounters:  06/30/22 5\' 5"  (1.651 m)    Weight:   Wt Readings from Last 1 Encounters:  06/30/22 54.4 kg    Ideal Body Weight:  56.8 kg  BMI:  Body mass index is 19.97 kg/m.  Estimated Nutritional Needs:   Kcal:  1650-1850  Protein:  85-100 grams  Fluid:  > 1.6 L    Loistine Chance, RD, LDN, [place] Registered Dietitian II Certified Diabetes Care and Education Specialist Please refer to [facility] for RD and/or RD on-call/weekend/after hours pager

## 2022-07-01 NOTE — ED Notes (Signed)
BiPap removed and pt placed on [place] at 6L so she can eat breakfast. Patient tolerating well.

## 2022-07-01 NOTE — Progress Notes (Signed)
*  PRELIMINARY RESULTS* Echocardiogram 2D Echocardiogram has been performed.  Dawn Sherman 07/01/2022, 2:16 PM

## 2022-07-01 NOTE — ED Notes (Signed)
Pt straight cathed, 700 mL out.

## 2022-07-01 NOTE — ED Notes (Signed)
PT back from nuclear medicine and vitals taken.

## 2022-07-01 NOTE — ED Notes (Signed)
Pt placed on 5L [place] at this time instead of 6L [place]. Pt tolerating well, WCTM.

## 2022-07-01 NOTE — Consult Note (Signed)
   Heart Failure Nurse Navigator Note  HFpEF 65 to 70%.  Mild left ventricular hypertrophy.  Left ventricular diastolic parameters indeterminate.  Mild mitral regurgitation.  She presented to the emergency room with 1 day of noting worsening shortness of breath with activity.  BNP was 1518.  Chest x-ray revealed cardiomegaly and mild pulmonary vascular congestion.  Comorbidities:  Hypertension GERD COPD Continued tobacco abuse Chronic kidney disease stage IV Paroxysmal atrial fibrillation on NOAC Depression/anxiety   Medications:  Amlodipine 10  mg daily Apixaban 2.5 mg twice daily Atorvastatin 40 mg daily Ferrous sulfate 325 mg twice a day Furosemide 40 mg p.o.   Labs:  Sodium 141, potassium 5.1, chloride 114, CO2 20, BUN 41, creatinine 2.53 Weight is 54.4 kg Blood pressure 134/70   Initial meeting with patient in the ED she is almost lying supine on the gurney.  Night any respiratory distress at this time.  She states at home that she had been weighing daily had and not noted a weight gain but did feel that her abdomen was distended and pants were fitting tighter.  Discussed fluid restriction, she states that she mostly chews on ice but did not feel that she went over 2 cups of ice daily along with drinking 4-5 6 ounce cups of decaf coffee.  She did not feel that she had any dietary indiscretions.  Will continue to follow along.  Pricilla Riffle RN [facility]

## 2022-07-01 NOTE — Progress Notes (Signed)
[facility]                                                                              Dawn Sherman, is a 66 y.o. female, DOB - 28-Mar-1956, DDU:[id number] Admit date - 06/30/2022    Outpatient Primary MD for the patient is [name], MD  LOS - 1  days  Chief Complaint  Patient presents with   Shortness of Breath       Brief summary   Patient is a 66 year old female with COPD, HFpEF,  CKD stage III, HTN, HLP, tobacco use, presented to ED with shortness of breath.  Symptoms started 2 days prior to admission, no chest pain.  Not on O2 at baseline.  Patient reported worsening shortness of breath with exertion, no fevers or chills, cough.  Current tobacco user.  In ED, she was given DuoNebs, steroids, Lasix 40 mg IV x1, aspirin Initially O2 sats 97% on 3 L, subsequently placed on BiPAP for worsening shortness of breath.  Assessment & Plan    Principal Problem:   Acute hypoxemic respiratory failure ([place]), multifactorial likely due to COPD exacerbation, CHF exacerbation, possible CAP pneumonia -Chest x-ray showed stable cardiomegaly with moderate severity right basilar infiltrate/atelectasis -Currently on BiPAP, BNP 1518.4, troponins 55-59, creatinine 2.1 on admission, procalcitonin less than 0.1 -VQ scan negative for PE -Wean off BiPAP as tolerated  Active Problems: COPD with acute exacerbation (HCC) -Currently diminished breath sounds with wheezing -Continue IV Zithromax, Rocephin,  -Change to Xopenex with Atrovent, placed on Pulmicort, Brovana -Currently on BiPAP, wean off as tolerated -Received Lasix 40 mg IV x1 in ED -Continue IV Solu-Medrol 40 mg daily, flutter valve     Paroxysmal A-fib (HCC) with tachycardia/RVR -Maintaining sinus rhythm, changed nebs to Xopenex and Atrovent -Cardiology consulted, continue eliquis -Recommended if needed for rate control  can transition amlodipine to diltiazem    CKD (chronic kidney disease), stage IV (HCC),  non-anion gap acidosis -Continue to monitor renal function.  Baseline creatinine 2-2.5 -Received IV Lasix in ED, follow renal function closely -Placed on sodium bicarb 650 mg twice daily  Acute on chronic HFpEF, + elevated troponins, abnormal EKG -2D echo pending -Received Lasix 20 g IV x1, cardiology consulted -Noted candidate for LHC or coronary CTA with renal dysfunction.  Unable to pursue Lexiscan with known second-degree AV block -Diuresis per cardiology recommendations    Hypertension -BP stable    HLD (hyperlipidemia) -Continue Lipitor    Tobacco abuse -Counseled on smoking cessation    GERD (gastroesophageal reflux disease) -Continue PPI    Depression -Continue Wellbutrin, Effexor   Code Status: Full code  DVT Prophylaxis:  apixaban (ELIQUIS) tablet 2.5 mg Start: 06/30/22 2200 Place TED hose Start: 06/30/22 1434 apixaban (ELIQUIS) tablet 2.5 mg   Level of Care: Level of care: Progressive Family Communication: Updated patient   Disposition Plan:      Remains inpatient appropriate: Acute medical conditions preclude safe discharge at this time, anticipating improvement in next 24-48hrs   Procedures:   Consultants:   Cardiology  Antimicrobials:   Anti-infectives (From admission, onward)    Start     Dose/Rate Route Frequency Ordered  Stop   06/30/22 1600  azithromycin (ZITHROMAX) 500 mg in sodium chloride 0.9 % 250 mL IVPB        500 mg 250 mL/hr over 60 Minutes Intravenous Every 24 hours 06/30/22 1440 07/05/22 1559   06/30/22 1500  cefTRIAXone (ROCEPHIN) 1 g in sodium chloride 0.9 % 100 mL IVPB        1 g 200 mL/hr over 30 Minutes Intravenous Every 24 hours 06/30/22 1440 07/05/22 1459          Medications  amLODipine  10 mg Oral Daily   apixaban  2.5 mg Oral BID   atorvastatin  40 mg Oral QHS   buPROPion  150 mg Oral Daily   calcium carbonate  1 tablet Oral TID WC   ferrous sulfate  325 mg Oral BID WC   fluticasone furoate-vilanterol  1 puff  Inhalation Daily   And   umeclidinium bromide  1 puff Inhalation Daily   furosemide  40 mg Oral Daily   hydrALAZINE  25 mg Oral TID   ipratropium-albuterol  3 mL Nebulization TID   methylPREDNISolone (SOLU-MEDROL) injection  40 mg Intravenous Daily   multivitamin with minerals  1 tablet Oral Daily   pantoprazole  40 mg Oral Daily   venlafaxine XR  150 mg Oral Q breakfast      Subjective:   Dawn Sherman was seen and examined today.  Seen on BiPAP, still having shortness of breath, no chest pain, nausea or vomiting.  Shortness of breath has been worsening with exertion in the last few days. Objective:   Vitals:   07/01/22 0829 07/01/22 0830 07/01/22 1000 07/01/22 1013  BP:  133/77 135/71 137/66  Pulse:  96 99   Resp:  (!) 25 17   Temp: 97.8 F (36.6 C)     TempSrc: Oral     SpO2:  91% 99%   Weight:      Height:        Intake/Output Summary (Last 24 hours) at 07/01/2022 1107 Last data filed at 06/30/2022 1800 Gross per 24 hour  Intake 345.11 ml  Output 400 ml  Net -54.89 ml     Wt Readings from Last 3 Encounters:  06/30/22 54.4 kg  06/08/22 59 kg  05/06/22 55.5 kg     Exam General: Alert and oriented x 3, NAD, on BiPAP Cardiovascular: S1 S2 auscultated,  RRR Respiratory: Diminished breath sounds bilaterally, coarse Gastrointestinal: Soft, nontender, nondistended, + bowel sounds Ext: no pedal edema bilaterally Neuro: Strength 5/5 upper and lower extremities bilaterally Psych: Normal affect and demeanor, alert and oriented x3     Data Reviewed:  I have personally reviewed following labs    CBC Lab Results  Component Value Date   WBC 5.6 07/01/2022   RBC 4.23 07/01/2022   HGB 12.0 07/01/2022   HCT 37.8 07/01/2022   MCV 89.4 07/01/2022   MCH 28.4 07/01/2022   PLT 160 07/01/2022   MCHC 31.7 07/01/2022   RDW 17.0 (H) 07/01/2022   LYMPHSABS 1.0 06/30/2022   MONOABS 0.7 06/30/2022   EOSABS 0.2 06/30/2022   BASOSABS 0.1 44/31/5400     Last metabolic  panel Lab Results  Component Value Date   NA 141 07/01/2022   K 5.1 07/01/2022   CL 114 (H) 07/01/2022   CO2 20 (L) 07/01/2022   BUN 41 (H) 07/01/2022   CREATININE 2.53 (H) 07/01/2022   GLUCOSE 114 (H) 07/01/2022   GFRNONAA 20 (L) 07/01/2022   GFRAA 38 (L) 09/05/2020  CALCIUM 8.9 07/01/2022   PHOS 2.2 (L) 12/14/2021   PROT 7.2 06/30/2022   ALBUMIN 4.1 06/30/2022   BILITOT 1.0 06/30/2022   ALKPHOS 112 06/30/2022   AST 34 06/30/2022   ALT 31 06/30/2022   ANIONGAP 7 07/01/2022    CBG (last 3)  No results for input(s): "GLUCAP" in the last 72 hours.    Coagulation Profile: No results for input(s): "INR", "PROTIME" in the last 168 hours.   Radiology Studies: I have personally reviewed the imaging studies  NM Pulmonary Perfusion  Result Date: 07/01/2022 CLINICAL DATA:  PE suspected, positive D-dimer EXAM: NUCLEAR MEDICINE PERFUSION LUNG SCAN TECHNIQUE: Perfusion images were obtained in multiple projections after intravenous injection of radiopharmaceutical. Ventilation scans intentionally deferred if perfusion scan and chest x-ray adequate for interpretation during COVID 19 epidemic. RADIOPHARMACEUTICALS:  4.43 mCi Tc-79m MAA IV COMPARISON:  Chest radiograph, 06/30/2022 FINDINGS: Normal, homogeneous perfusion of the lungs. No suspicious perfusion defect. Cardiomegaly. IMPRESSION: 1. Very low probability for pulmonary embolism by modified perfusion only PIOPED criteria (PE absent). 2.  Cardiomegaly. Electronically Signed   By: Delanna Ahmadi M.D.   On: 07/01/2022 10:50   DG Chest Port 1 View  Result Date: 06/30/2022 CLINICAL DATA:  Shortness of breath. EXAM: PORTABLE CHEST 1 VIEW COMPARISON:  June 30, 2022 FINDINGS: The cardiac silhouette is mildly enlarged and unchanged in size. Predominant stable, mild to moderate severity diffusely increased interstitial lung markings are seen. Moderate severity right basilar atelectasis and/or infiltrate is also noted. Mild prominence of the  pulmonary vasculature is seen. There is no evidence of a pleural effusion or pneumothorax. There are multiple chronic left-sided rib fractures with marked severity dextroscoliosis of the lower thoracic spine. IMPRESSION: 1. Stable cardiomegaly with moderate severity right basilar atelectasis and/or infiltrate. 2. Chronic interstitial lung disease with mild pulmonary vascular congestion. A superimposed component of mild interstitial edema cannot be excluded. Electronically Signed   By: Virgina Norfolk M.D.   On: 06/30/2022 20:05   DG Chest 2 View  Result Date: 06/30/2022 CLINICAL DATA:  Shortness of breath EXAM: CHEST - 2 VIEW COMPARISON:  06/08/2022 FINDINGS: Mild cardiomegaly, stable. Mild vascular congestion. Increasing interstitial opacities at the right lung base. Trace right pleural effusion. No pneumothorax. Scoliotic spinal curvature. Remote left-sided rib fractures. IMPRESSION: Increasing interstitial opacities at the right lung base with trace right pleural effusion. Findings may be related to asymmetric edema or atypical/viral infection. Electronically Signed   By: Davina Poke D.O.   On: 06/30/2022 13:16       Villa Burgin M.D. [facility] 07/01/2022, 11:07 AM  Available via Epic secure chat 7am-7pm After 7 pm, please refer to night coverage provider listed on amion.

## 2022-07-01 NOTE — ED Notes (Addendum)
Bladder scan performed, 376mL. Dr Tana Coast notified.

## 2022-07-01 NOTE — Consult Note (Signed)
Cardiology Consultation:   Patient ID: RHIANN BOUCHER; [id number]; 1956/04/22   Admit date: 06/30/2022 Date of Consult: 07/01/2022  Primary Care Provider: [name], MD Primary Cardiologist: Rockey Situ Primary Electrophysiologist:  None   Patient Profile:   FARIDA MCREYNOLDS is a 66 y.o. female with a hx of HFpEF, PAF not on [place] in the setting of [place] secondary to mechanical fall in 11/2021, CKD stage IV, COPD, and HTN who is being seen today for the evaluation of HFpEF at the request of Dr. Tana Coast.  History of Present Illness:   Ms. Portales was admitted in 01/2020 with dyspnea. Echo at that time showed an EF of 3035%, Gr2DD, moderate mitral regurgitation, mild to moderate tricuspid regurgitation, normal RV systolic function and ventricular cavity size, and mildly elevated PASP. She declined cardiac cath at that time. Lexiscan MPI was intermediate risk dur to LVEF, though showed no evidence of ischemia. She was noted to have new onset Afib and was eventually started on Eliquis. Repeat echo in 07/2020 demonstrated an EF of 50-55%, no RWMA, Gr1DD, normal RVSF and ventricular cavity size, mildly dilated left atrium, mild mitral regurgitation, and an estimated right atrial pressure of 3 mmHg. She was admitted in 11/2021 with a fall in the context of GI illness and weakness. She was found to have an [place] with [place] recommended to be held at that time until cleared by neurosurgery. Echo during that admission showed an EF of 65-70%, normal wall motion, mild LVH, normal RVSF and ventricular cavity size, normal PASP, mildly dilated left atrium, mild mitral regurgitation, and an estimated right atrial pressure of 3 mmHg. She was admitted to the hospital in 02/2022 with acute on chronic HFpEF with symptom improvement following diuresis. She remained off [place] at that time.   She reports she was restarted on Eliquis 2.5 mg at her office visit with primary cardiologist on 04/08/2022, though she has only been taking Eliquis  once daily, rather than twice daily due to cost.   She was seen in the ED on 06/08/2022 with elevated BP, epistaxis, and lower extremity swelling. BNP at that time of 909. CXR without acute process. She was treated with IV Lasix and advised to follow up.   She was admitted to [facility] on [facility]/5 with acute hypoxic respiratory failure requiring BiPAP. Leading up to her admission, she reported a 2-day history of exertional dyspnea, orthopnea, and palpitations. No chest pain, lower extremity swelling, abdominal distension, fever, chills, or cough. Vitals stable. Afebrile. CXR notable for increasing interstitial opacities at the right lung base along with a trace right pleural effusion. Repeat CXR showed moderate severeity right basilar atelectasis and/or infiltrate with chronic interstitial lung disease with mild pulmonary vascular congestion with a possible superimposed component of interstitial edema. Labs notable for BNP 1518, D-dimer 1.64, HS-Tn 59 with a delta troponin of 55, BUN/SCr 38/2.17 trending to 41/2.53. VQ scan pending.   Medication administered in the ED and at admission: IV metoprolol and oral prednisone.   Currently notes an improvement in her dyspnea, though remains on significant supplemental oxygen via nasal cannula at 6 L. She was noted to be in Afib with RVR on 11/5 in the ED beginning at 14:57 lasting until 18:30. Currently, maintaining sinus rhythm.    Past Medical History:  Diagnosis Date   B12 deficiency    CHF (congestive heart failure) (HCC)    CKD (chronic kidney disease), stage III (HCC)    COPD (chronic obstructive pulmonary disease) ([place])    Depression  Endometriosis    Hypertension    Mixed hyperlipidemia    Tobacco abuse     Past Surgical History:  Procedure Laterality Date   ABDOMINAL HYSTERECTOMY     BREAST BIOPSY Right 2015   neg-  FIBROADENOMA   TAH/RSO  1999   secondary to bleeding and endometriosis (Dr Vernie Ammons)     Home Meds: Prior to Admission  medications   Medication Sig Start Date End Date Taking? Authorizing Provider  amLODipine (NORVASC) 10 MG tablet Take 1 tablet (10 mg total) by mouth daily. 04/08/22  Yes Gollan, Kathlene November, MD  apixaban (ELIQUIS) 2.5 MG TABS tablet Take 1 tablet (2.5 mg total) by mouth 2 (two) times daily. 04/08/22  Yes Gollan, Kathlene November, MD  atorvastatin (LIPITOR) 40 MG tablet TAKE ONE TABLET BY MOUTH EVERY DAY 05/29/22  Yes [name], MD  buPROPion (WELLBUTRIN XL) 150 MG 24 hr tablet TAKE ONE TABLET BY MOUTH EVERY DAY 04/11/22  Yes [name], MD  calcium carbonate (OS-CAL - DOSED IN MG OF ELEMENTAL CALCIUM) 1250 (500 Ca) MG tablet Take 1 tablet (500 mg of elemental calcium total) by mouth 3 (three) times daily with meals. 12/14/21  Yes Fritzi Mandes, MD  FARXIGA 10 MG TABS tablet Take 10 mg by mouth daily. 11/20/21  Yes [provider]  ferrous sulfate 325 (65 FE) MG tablet Take 1 tablet (325 mg total) by mouth 2 (two) times daily with a meal. 12/14/21  Yes Fritzi Mandes, MD  Fluticasone-Umeclidin-Vilant (TRELEGY ELLIPTA) 100-62.5-25 MCG/ACT AEPB Inhale 1 puff into the lungs daily. 03/05/22  Yes [name], MD  furosemide (LASIX) 40 MG tablet Take 1 tablet (40 mg total) by mouth daily. Take 40 mg daily and and extra 40 mg as needed for fluid retention 04/24/22  Yes Darylene Price A, FNP  hydrALAZINE (APRESOLINE) 25 MG tablet Take 1 tablet (25 mg total) by mouth 3 (three) times daily. 01/31/22  Yes Hackney, Tina A, FNP  loratadine (CLARITIN) 10 MG tablet Take 10 mg by mouth daily.   Yes [provider]  omeprazole (PRILOSEC) 20 MG capsule Take 20 mg by mouth daily.   Yes [provider]  venlafaxine XR (EFFEXOR-XR) 150 MG 24 hr capsule TAKE ONE CAPSULE BY MOUTH EVERY DAY 06/24/22  Yes [name], MD  acetaminophen (TYLENOL) 325 MG tablet Take 650 mg by mouth every 6 (six) hours as needed.    [provider]  albuterol (VENTOLIN HFA) 108 (90 Base) MCG/ACT inhaler Inhale 2  puffs into the lungs every 6 (six) hours as needed for wheezing or shortness of breath. 03/14/22   Lucrezia Starch, MD    Inpatient Medications: Scheduled Meds:  amLODipine  10 mg Oral Daily   apixaban  2.5 mg Oral BID   atorvastatin  40 mg Oral QHS   buPROPion  150 mg Oral Daily   calcium carbonate  1 tablet Oral TID WC   ferrous sulfate  325 mg Oral BID WC   fluticasone furoate-vilanterol  1 puff Inhalation Daily   And   umeclidinium bromide  1 puff Inhalation Daily   furosemide  40 mg Oral Daily   hydrALAZINE  25 mg Oral TID   ipratropium-albuterol  3 mL Nebulization TID   methylPREDNISolone (SOLU-MEDROL) injection  40 mg Intravenous Daily   pantoprazole  40 mg Oral Daily   venlafaxine XR  150 mg Oral Q breakfast   Continuous Infusions:  azithromycin Stopped (06/30/22 1750)   cefTRIAXone (ROCEPHIN)  IV Stopped (06/30/22 1600)  PRN Meds: acetaminophen **OR** acetaminophen, albuterol, hydrALAZINE, metoprolol tartrate, nicotine, ondansetron **OR** ondansetron (ZOFRAN) IV, senna-docusate  Allergies:   Allergies  Allergen Reactions   Sulfate Rash   Codeine Sulfate Nausea Only   Benicar [Olmesartan]     Talked with patient February 10, 2020, intolerance is unclear, tried several medications around that time and one of them gave her a rash but she is not clear which 1.   Amoxicillin Rash   Clindamycin/Lincomycin Rash   Morphine And Related Rash   Penicillins Rash    Social History:   Social History   Socioeconomic History   Marital status: Married    Spouse name: Not on file   Number of children: Not on file   Years of education: Not on file   Highest education level: Not on file  Occupational History   Not on file  Tobacco Use   Smoking status: Every Day    Packs/day: 0.25    Years: 40.00    Total pack years: 10.00    Types: Cigarettes   Smokeless tobacco: Never  Vaping Use   Vaping Use: Never used  Substance and Sexual Activity   Alcohol use: Not Currently     Alcohol/week: 0.0 standard drinks of alcohol    Comment: occasional   Drug use: No   Sexual activity: Not Currently  Other Topics Concern   Not on file  Social History Narrative   Lives in [place] with her husband.  She is retired from [employer].  She does not routinely exercise.   Social Determinants of Health   Financial Resource Strain: Low Risk  (11/27/2021)   Overall Financial Resource Strain (CARDIA)    Difficulty of Paying Living Expenses: Not hard at all  Food Insecurity: No Food Insecurity (07/01/2022)   Hunger Vital Sign    Worried About Running Out of Food in the Last Year: Never true    Ran Out of Food in the Last Year: Never true  Transportation Needs: No Transportation Needs (07/01/2022)   PRAPARE - Hydrologist (Medical): No    Lack of Transportation (Non-Medical): No  Physical Activity: Sufficiently Active (11/27/2021)   Exercise Vital Sign    Days of Exercise per Week: 7 days    Minutes of Exercise per Session: 30 min  Stress: No Stress Concern Present (11/27/2021)   [place]    Feeling of Stress : Not at all  Social Connections: Unknown (11/27/2021)   Social Connection and Isolation Panel [NHANES]    Frequency of Communication with Friends and Family: Not on file    Frequency of Social Gatherings with Friends and Family: Not on file    Attends Religious Services: Not on file    Active Member of Clubs or Organizations: Not on file    Attends Archivist Meetings: Not on file    Marital Status: Married  Intimate Partner Violence: Not At Risk (07/01/2022)   Humiliation, Afraid, Rape, and Kick questionnaire    Fear of Current or Ex-Partner: No    Emotionally Abused: No    Physically Abused: No    Sexually Abused: No     Family History:   Family History  Problem Relation Age of Onset   Hypercholesterolemia Mother    Rheum arthritis Father    Rheum  arthritis Daughter    Fibromyalgia Daughter    Breast cancer Neg Hx    Colon cancer Neg Hx  ROS:  Review of Systems  Constitutional:  Positive for malaise/fatigue. Negative for chills, diaphoresis, fever and weight loss.  HENT:  Negative for congestion.   Eyes:  Negative for discharge and redness.  Respiratory:  Positive for shortness of breath. Negative for cough, sputum production and wheezing.   Cardiovascular:  Positive for orthopnea. Negative for chest pain, palpitations, claudication, leg swelling and PND.  Gastrointestinal:  Negative for abdominal pain, heartburn, nausea and vomiting.  Musculoskeletal:  Negative for falls and myalgias.  Skin:  Negative for rash.  Neurological:  Negative for dizziness, tingling, tremors, sensory change, speech change, focal weakness, loss of consciousness and weakness.  Endo/Heme/Allergies:  Does not bruise/bleed easily.  Psychiatric/Behavioral:  Negative for substance abuse. The patient is not nervous/anxious.   All other systems reviewed and are negative.     Physical Exam/Data:   Vitals:   07/01/22 0800 07/01/22 0828 07/01/22 0829 07/01/22 0830  BP: (!) 135/91   133/77  Pulse: 92 92  96  Resp: 17   (!) 25  Temp:   97.8 F (36.6 C)   TempSrc:   Oral   SpO2: 98% 97%  91%  Weight:      Height:        Intake/Output Summary (Last 24 hours) at 07/01/2022 0945 Last data filed at 06/30/2022 1800 Gross per 24 hour  Intake 345.11 ml  Output 400 ml  Net -54.89 ml   Filed Weights   06/30/22 1214  Weight: 54.4 kg   Body mass index is 19.97 kg/m.   Physical Exam: General: Well developed, well nourished, in no acute distress. Head: Normocephalic, atraumatic, sclera non-icteric, no xanthomas, nares without discharge.  Neck: Negative for carotid bruits. JVD not elevated. Lungs: Coarse and diminished breath sounds bilaterally R>L. Breathing is unlabored. Heart: RRR with S1 S2. No murmurs, rubs, or gallops appreciated. Abdomen: Soft,  non-tender, non-distended with normoactive bowel sounds. No hepatomegaly. No rebound/guarding. No obvious abdominal masses. Msk:  Strength and tone appear normal for age. Extremities: No clubbing or cyanosis. No edema. Distal pedal pulses are 2+ and equal bilaterally. Neuro: Alert and oriented X 3. No facial asymmetry. No focal deficit. Moves all extremities spontaneously. Psych:  Responds to questions appropriately with a normal affect.   EKG:  The EKG was personally reviewed and demonstrates: NSR, 94 bpm, baseline wandering and artifact, 1st degree AV block, diffuse TWI consistent with prior tracings dating back to 2021 Telemetry:  Telemetry was personally reviewed and demonstrates: SR with development of Afib with RVR on 11/5 at 14:57 lasting until at 18:30 (rate controlled prior to conversion to sinus), has maintained sinus since   Weights: Filed Weights   06/30/22 1214  Weight: 54.4 kg    Relevant CV Studies:  2D echo 12/11/2021: 1. Left ventricular ejection fraction, by estimation, is 65 to 70%. The  left ventricle has normal function. The left ventricle has no regional  wall motion abnormalities. There is mild left ventricular hypertrophy.  Left ventricular diastolic parameters  are indeterminate.   2. Right ventricular systolic function is normal. The right ventricular  size is normal. There is normal pulmonary artery systolic pressure.   3. Left atrial size was mildly dilated.   4. The mitral valve is degenerative. Mild mitral valve regurgitation. No  evidence of mitral stenosis.   5. The aortic valve has an indeterminant number of cusps. There is mild  thickening of the aortic valve. Aortic valve regurgitation is not  visualized. Aortic valve sclerosis is present, with no  evidence of aortic  valve stenosis.   6. The inferior vena cava is normal in size with greater than 50%  respiratory variability, suggesting right atrial pressure of 3 mmHg.   Laboratory  Data:  Chemistry Recent Labs  Lab 06/30/22 1227 07/01/22 0515  NA 139 141  K 4.8 5.1  CL 110 114*  CO2 21* 20*  GLUCOSE 85 114*  BUN 38* 41*  CREATININE 2.17* 2.53*  CALCIUM 8.9 8.9  GFRNONAA 24* 20*  ANIONGAP 8 7    Recent Labs  Lab 06/30/22 1227  PROT 7.2  ALBUMIN 4.1  AST 34  ALT 31  ALKPHOS 112  BILITOT 1.0   Hematology Recent Labs  Lab 06/30/22 1227 07/01/22 0515  WBC 8.5 5.6  RBC 4.34 4.23  HGB 12.3 12.0  HCT 38.7 37.8  MCV 89.2 89.4  MCH 28.3 28.4  MCHC 31.8 31.7  RDW 16.9* 17.0*  PLT 188 160   Cardiac EnzymesNo results for input(s): "TROPONINI" in the last 168 hours. No results for input(s): "TROPIPOC" in the last 168 hours.  BNP Recent Labs  Lab 06/30/22 1227  BNP 1,518.4*    DDimer  Recent Labs  Lab 06/30/22 1227 07/01/22 0653  DDIMER 1.64* 1.06*    Radiology/Studies:  DG Chest Port 1 View  Result Date: 06/30/2022 IMPRESSION: 1. Stable cardiomegaly with moderate severity right basilar atelectasis and/or infiltrate. 2. Chronic interstitial lung disease with mild pulmonary vascular congestion. A superimposed component of mild interstitial edema cannot be excluded. Electronically Signed   By: Virgina Norfolk M.D.   On: 06/30/2022 20:05   DG Chest 2 View  Result Date: 06/30/2022 IMPRESSION: Increasing interstitial opacities at the right lung base with trace right pleural effusion. Findings may be related to asymmetric edema or atypical/viral infection. Electronically Signed   By: Davina Poke D.O.   On: 06/30/2022 13:16    Assessment and Plan:   1. Acute hypoxic respiratory failure: -Multifactorial including COPD exacerbation with possible PNA noted on imaging as well as volume overload -VQ scan pending to evaluate for PE -Wean supplemental oxygen as able   2. Acute on chronic HFpEF: -Despite elevated BNP, she does not appear grossly volume up -Suspect there is a component of volume overload, though the majority of her symptoms  appear to be pulmonary in etiology, this is also supported by improvement in symptoms despite diuresis  -Obtain echo to evaluate for new cardiomyopathy or WMA -Monitor renal function -CHF education -Strict I/O -Daily weights  3. Elevated high sensitivity troponin with abnormal EKG: -Mildly elevated and down trending -Not consistent with ACS -Abnormal EKG dates back to 2021 -Not a good candidate for LHC or coronary CTA with renal dysfunction  -Unable to pursue Lexiscan MPI with known 2nd degree AV block -Order echo  4. PAF: -Brief Afib in the ED, maintaining sinus rhythm currently -If needed for rate control, could transition amlodipine to diltiazem -CHADS2VASc at least 4 (CHF, HTN, age x 1, sex category) -She was restarted on Eliquis at her OV on 04/08/2022, though has only been taking this once daily due to cost -Consult care manager to assist with [place]   5. CKD stage IV: -Avoid nephrotoxic agents -Monitor  6. COPD exacerbation with possible PNA: -Suspect primary pulmonary etiology of admission  -Consider transitioning albuterol to Xopenex, defer to IM -Per IM  7. Elevated d-dimer: -VQ pending -Unable to pursue CTA chest with underlying renal dysfunction  -Cannot exclude PE given she has been subtherapeutic with Eliquis   8.  HTN: -Blood pressure reasonably controlled currently   9. HLD: -PTA [place]        For questions or updates, please contact [place] Please consult [email or web address] for contact info under Cardiology/STEMI.   Signed, Christell Faith, PA-C [place] Pager: [phone] 07/01/2022, 9:45 AM

## 2022-07-01 NOTE — ED Notes (Signed)
Pt bladder scanned, RN notes 364 mL urine in bladder. Pt states "I feel like I have to pee."

## 2022-07-02 DIAGNOSIS — J9601 Acute respiratory failure with hypoxia: Secondary | ICD-10-CM | POA: Diagnosis not present

## 2022-07-02 DIAGNOSIS — I5033 Acute on chronic diastolic (congestive) heart failure: Secondary | ICD-10-CM

## 2022-07-02 DIAGNOSIS — I509 Heart failure, unspecified: Secondary | ICD-10-CM | POA: Diagnosis not present

## 2022-07-02 DIAGNOSIS — K219 Gastro-esophageal reflux disease without esophagitis: Secondary | ICD-10-CM

## 2022-07-02 DIAGNOSIS — J432 Centrilobular emphysema: Secondary | ICD-10-CM | POA: Diagnosis not present

## 2022-07-02 LAB — BASIC METABOLIC PANEL
Anion gap: 5 (ref 5–15)
BUN: 52 mg/dL — ABNORMAL HIGH (ref 8–23)
CO2: 22 mmol/L (ref 22–32)
Calcium: 8.5 mg/dL — ABNORMAL LOW (ref 8.9–10.3)
Chloride: 110 mmol/L (ref 98–111)
Creatinine, Ser: 2.78 mg/dL — ABNORMAL HIGH (ref 0.44–1.00)
GFR, Estimated: 18 mL/min — ABNORMAL LOW (ref >60)
Glucose, Bld: 111 mg/dL — ABNORMAL HIGH (ref 70–99)
Potassium: 4.6 mmol/L (ref 3.5–5.1)
Sodium: 137 mmol/L (ref 135–145)

## 2022-07-02 LAB — LACTIC ACID, PLASMA: Lactic Acid, Venous: 0.5 mmol/L (ref 0.5–1.9)

## 2022-07-02 MED ORDER — DILTIAZEM HCL 30 MG PO TABS
30.0000 mg | ORAL_TABLET | Freq: Four times a day (QID) | ORAL | Status: DC
  Administered 2022-07-02 – 2022-07-03 (×5): 30 mg via ORAL
  Filled 2022-07-02 (×5): qty 1

## 2022-07-02 NOTE — Progress Notes (Signed)
[facility]                                                                              Dawn Sherman, is a 66 y.o. female, DOB - Jan 12, 1956, WCB:[id number] Admit date - 06/30/2022    Outpatient Primary MD for the patient is Dawn Pheasant, MD  LOS - 2  days  Chief Complaint  Patient presents with   Shortness of Breath       Brief summary   Patient is a 66 year old female with COPD, HFpEF,  CKD stage III, HTN, HLP, tobacco use, presented to ED with shortness of breath.  Symptoms started 2 days prior to admission, no chest pain.  Not on O2 at baseline.  Patient reported worsening shortness of breath with exertion, no fevers or chills, cough.  Current tobacco user.  In ED, she was given DuoNebs, steroids, Lasix 40 mg IV x1, aspirin Initially O2 sats 97% on 3 L, subsequently placed on BiPAP for worsening shortness of breath.  Assessment & Plan    Principal Problem:   Acute hypoxemic respiratory failure (HCC), multifactorial likely due to COPD exacerbation, CHF exacerbation, possible CAP pneumonia -Chest x-ray showed stable cardiomegaly with moderate severity right basilar infiltrate/atelectasis -Currently on BiPAP, BNP 1518.4, troponins 55-59, creatinine 2.1 on admission, procalcitonin less than 0.1 -VQ scan negative for PE -Bipap weaned off, O2 sats 96% on 2 L  Active Problems: COPD with acute exacerbation (HCC) --Continue IV Zithromax, Rocephin,  -Continue Xopenex, Atrovent, Pulmicort, Brovana  -BiPAP weaned off, O2 sat 76% on 2 L. -Continue IV Solu-Medrol 40 mg daily, flutter valve     Paroxysmal A-fib (HCC) with tachycardia/RVR -Back in A-fib with RVR, heart rate in low 100s. -Cardiology consulted, continue eliquis -Discontinued amlodipine, started on short acting diltiazem 30 mg every 6 hours -Per cardiology, if DCCV needed, will need TEE first.    CKD (chronic kidney disease), stage IV ([place]), non-anion gap acidosis -Continue to monitor renal  function.  Baseline creatinine 2-2.5 -Patient was placed on IV Lasix, now held due to worsening renal function -Placed on sodium bicarb 650 mg twice daily  Acute on chronic HFpEF, + elevated troponins, abnormal EKG -Received Lasix 20 g IV x1, cardiology consulted -Noted candidate for LHC or coronary CTA with renal dysfunction.  Unable to pursue Lexiscan with known second-degree AV block -2D echo showed EF of 50 to 55%, G2 DD -Cardiology following, renal function worsened today, 2.7, holding Lasix    Hypertension -BP stable    HLD (hyperlipidemia) -Continue Lipitor    Tobacco abuse -Counseled on smoking cessation    GERD (gastroesophageal reflux disease) -Continue PPI    Depression -Continue Wellbutrin, Effexor   Code Status: Full code  DVT Prophylaxis:  apixaban (ELIQUIS) tablet 2.5 mg Start: 06/30/22 2200 Place TED hose Start: 06/30/22 1434 apixaban (ELIQUIS) tablet 2.5 mg   Level of Care: Level of care: Progressive Family Communication: Updated patient  Disposition Plan:      Remains inpatient appropriate: Acute medical conditions preclude safe discharge at this time, anticipating improvement in next 24-48hrs   Procedures:   Consultants:   Cardiology  Antimicrobials:   Anti-infectives (From  admission, onward)    Start     Dose/Rate Route Frequency Ordered Stop   06/30/22 1600  azithromycin (ZITHROMAX) 500 mg in sodium chloride 0.9 % 250 mL IVPB        500 mg 250 mL/hr over 60 Minutes Intravenous Every 24 hours 06/30/22 1440 07/05/22 1559   06/30/22 1500  cefTRIAXone (ROCEPHIN) 1 g in sodium chloride 0.9 % 100 mL IVPB        1 g 200 mL/hr over 30 Minutes Intravenous Every 24 hours 06/30/22 1440 07/05/22 1459          Medications  apixaban  2.5 mg Oral BID   arformoterol  15 mcg Nebulization BID   atorvastatin  40 mg Oral QHS   budesonide (PULMICORT) nebulizer solution  0.25 mg Nebulization BID   buPROPion  150 mg Oral Daily   calcium carbonate  1  tablet Oral TID WC   diltiazem  30 mg Oral Q6H   ferrous sulfate  325 mg Oral BID WC   hydrALAZINE  25 mg Oral TID   levalbuterol  0.63 mg Nebulization BID   And   ipratropium  0.5 mg Nebulization BID   methylPREDNISolone (SOLU-MEDROL) injection  40 mg Intravenous Daily   multivitamin with minerals  1 tablet Oral Daily   pantoprazole  40 mg Oral Daily   sodium bicarbonate  650 mg Oral BID   tamsulosin  0.4 mg Oral Daily   venlafaxine XR  150 mg Oral Q breakfast      Subjective:   Dawn Sherman was seen and examined today.  Overall improving, weaned off of BiPAP, O2 sats in 90s on 2 L.  No chest pain, nausea or vomiting.     Objective:   Vitals:   07/02/22 0716 07/02/22 0754 07/02/22 0815 07/02/22 1205  BP:  124/73  138/79  Pulse:  94  (!) 108  Resp:  18  19  Temp:  98.3 F (36.8 C)  98.7 F (37.1 C)  TempSrc:  Oral  Oral  SpO2: 96% 94% 93% 96%  Weight:      Height:        Intake/Output Summary (Last 24 hours) at 07/02/2022 1438 Last data filed at 07/02/2022 0500 Gross per 24 hour  Intake --  Output 1325 ml  Net -1325 ml     Wt Readings from Last 3 Encounters:  06/30/22 54.4 kg  06/08/22 59 kg  05/06/22 55.5 kg   Physical Exam General: Alert and oriented x 3, NAD Cardiovascular: Irregularly irregular, tachycardia  Respiratory: Diminished bilaterally with rhonchi Gastrointestinal: Soft, nontender, nondistended, NBS Ext: no pedal edema bilaterally Neuro: no new deficits Skin: No rashes Psych: Normal affect    Data Reviewed:  I have personally reviewed following labs    CBC Lab Results  Component Value Date   WBC 5.6 07/01/2022   RBC 4.23 07/01/2022   HGB 12.0 07/01/2022   HCT 37.8 07/01/2022   MCV 89.4 07/01/2022   MCH 28.4 07/01/2022   PLT 160 07/01/2022   MCHC 31.7 07/01/2022   RDW 17.0 (H) 07/01/2022   LYMPHSABS 1.0 06/30/2022   MONOABS 0.7 06/30/2022   EOSABS 0.2 06/30/2022   BASOSABS 0.1 42/70/6237     Last metabolic panel Lab  Results  Component Value Date   NA 137 07/02/2022   K 4.6 07/02/2022   CL 110 07/02/2022   CO2 22 07/02/2022   BUN 52 (H) 07/02/2022   CREATININE 2.78 (H) 07/02/2022   GLUCOSE 111 (H) 07/02/2022  GFRNONAA 18 (L) 07/02/2022   GFRAA 38 (L) 09/05/2020   CALCIUM 8.5 (L) 07/02/2022   PHOS 2.2 (L) 12/14/2021   PROT 7.2 06/30/2022   ALBUMIN 4.1 06/30/2022   BILITOT 1.0 06/30/2022   ALKPHOS 112 06/30/2022   AST 34 06/30/2022   ALT 31 06/30/2022   ANIONGAP 5 07/02/2022    CBG (last 3)  No results for input(s): "GLUCAP" in the last 72 hours.    Coagulation Profile: No results for input(s): "INR", "PROTIME" in the last 168 hours.   Radiology Studies: I have personally reviewed the imaging studies  ECHOCARDIOGRAM COMPLETE  Result Date: 07/01/2022    ECHOCARDIOGRAM REPORT   Patient Name:   KRISS PERLEBERG Date of Exam: 07/01/2022 Medical Rec #:  [id number]     Height:       65.0 in Accession #:    [id number]    Weight:       120.0 lb Date of Birth:  [birth year]    BSA:          1.592 m Patient Age:    88 years      BP:           137/66 mmHg Patient Gender: F             HR:           99 bpm. Exam Location:  [facility] Procedure: 2D Echo, Color Doppler, Cardiac Doppler and Strain Analysis Indications:     R06.00 Dyspnea  History:         Patient has prior history of Echocardiogram examinations, most                  recent 12/11/2021. CHF, CKD and COPD; Risk Factors:Hypertension,                  Dyslipidemia and Current Smoker.  Sonographer:     Charmayne Sheer Referring Phys:  [id number] Rise Mu Diagnosing Phys: Kathlyn Sacramento MD  Sonographer Comments: Global longitudinal strain was attempted. IMPRESSIONS  1. Left ventricular ejection fraction, by estimation, is 50 to 55%. The left ventricle has low normal function. The left ventricle has no regional wall motion abnormalities. The left ventricular internal cavity size was mildly dilated. Left ventricular diastolic parameters are consistent with Grade II  diastolic dysfunction (pseudonormalization).  2. Right ventricular systolic function is normal. The right ventricular size is normal.  3. Left atrial size was mildly dilated.  4. The mitral valve is normal in structure. Moderate mitral valve regurgitation. No evidence of mitral stenosis.  5. The aortic valve is normal in structure. Aortic valve regurgitation is not visualized. Mild aortic valve stenosis. Aortic valve area, by VTI measures 1.92 cm. Aortic valve mean gradient measures 8.0 mmHg.  6. The inferior vena cava is normal in size with greater than 50% respiratory variability, suggesting right atrial pressure of 3 mmHg. FINDINGS  Left Ventricle: Left ventricular ejection fraction, by estimation, is 50 to 55%. The left ventricle has low normal function. The left ventricle has no regional wall motion abnormalities. Global longitudinal strain performed but not reported based on interpreter judgement due to suboptimal tracking. The left ventricular internal cavity size was mildly dilated. There is no left ventricular hypertrophy. Left ventricular diastolic parameters are consistent with Grade II diastolic dysfunction (pseudonormalization). Right Ventricle: The right ventricular size is normal. No increase in right ventricular wall thickness. Right ventricular systolic function is normal. Left Atrium: Left atrial size was mildly dilated. Right Atrium: Right  atrial size was normal in size. Pericardium: There is no evidence of pericardial effusion. Mitral Valve: The mitral valve is normal in structure. There is moderate thickening of the mitral valve leaflet(s). There is mild calcification of the mitral valve leaflet(s). Moderate mitral valve regurgitation. No evidence of mitral valve stenosis. Tricuspid Valve: The tricuspid valve is normal in structure. Tricuspid valve regurgitation is not demonstrated. No evidence of tricuspid stenosis. Aortic Valve: The aortic valve is normal in structure. Aortic valve  regurgitation is not visualized. Mild aortic stenosis is present. Aortic valve mean gradient measures 8.0 mmHg. Aortic valve peak gradient measures 12.8 mmHg. Aortic valve area, by VTI measures 1.92 cm. Pulmonic Valve: The pulmonic valve was normal in structure. Pulmonic valve regurgitation is not visualized. No evidence of pulmonic stenosis. Aorta: The aortic root is normal in size and structure. Venous: The inferior vena cava is normal in size with greater than 50% respiratory variability, suggesting right atrial pressure of 3 mmHg. IAS/Shunts: No atrial level shunt detected by color flow Doppler.  LEFT VENTRICLE PLAX 2D LVIDd:         5.20 cm   Diastology LVIDs:         4.00 cm   LV e' medial:    7.18 cm/s LV PW:         1.10 cm   LV E/e' medial:  21.4 LV IVS:        0.70 cm   LV e' lateral:   12.80 cm/s LVOT diam:     1.80 cm   LV E/e' lateral: 12.0 LV SV:         63 LV SV Index:   40 LVOT Area:     2.54 cm  RIGHT VENTRICLE RV Basal diam:  3.70 cm RV S prime:     13.20 cm/s LEFT ATRIUM             Index        RIGHT ATRIUM           Index LA diam:        5.10 cm 3.20 cm/m   RA Area:     15.40 cm LA Vol (A2C):   62.6 ml 39.32 ml/m  RA Volume:   39.30 ml  24.68 ml/m LA Vol (A4C):   52.4 ml 32.91 ml/m LA Biplane Vol: 58.6 ml 36.81 ml/m  AORTIC VALVE                     PULMONIC VALVE AV Area (Vmax):    1.82 cm      PV Vmax:       1.44 m/s AV Area (Vmean):   1.65 cm      PV Vmean:      102.000 cm/s AV Area (VTI):     1.92 cm      PV VTI:        0.258 m AV Vmax:           179.00 cm/s   PV Peak grad:  8.3 mmHg AV Vmean:          136.000 cm/s  PV Mean grad:  5.0 mmHg AV VTI:            0.329 m AV Peak Grad:      12.8 mmHg AV Mean Grad:      8.0 mmHg LVOT Vmax:         128.00 cm/s LVOT Vmean:        88.300 cm/s LVOT  VTI:          0.248 m LVOT/AV VTI ratio: 0.75  AORTA Ao Root diam: 3.10 cm MITRAL VALVE MV Area (PHT): 6.32 cm     SHUNTS MV Decel Time: 120 msec     Systemic VTI:  0.25 m MV E velocity: 154.00  cm/s  Systemic Diam: 1.80 cm MV A velocity: 143.00 cm/s MV E/A ratio:  1.08 Kathlyn Sacramento MD Electronically signed by Kathlyn Sacramento MD Signature Date/Time: 07/01/2022/3:03:52 PM    Final    NM Pulmonary Perfusion  Result Date: 07/01/2022 CLINICAL DATA:  PE suspected, positive D-dimer EXAM: NUCLEAR MEDICINE PERFUSION LUNG SCAN TECHNIQUE: Perfusion images were obtained in multiple projections after intravenous injection of radiopharmaceutical. Ventilation scans intentionally deferred if perfusion scan and chest x-ray adequate for interpretation during COVID 19 epidemic. RADIOPHARMACEUTICALS:  4.43 mCi Tc-38m MAA IV COMPARISON:  Chest radiograph, 06/30/2022 FINDINGS: Normal, homogeneous perfusion of the lungs. No suspicious perfusion defect. Cardiomegaly. IMPRESSION: 1. Very low probability for pulmonary embolism by modified perfusion only PIOPED criteria (PE absent). 2.  Cardiomegaly. Electronically Signed   By: Delanna Ahmadi M.D.   On: 07/01/2022 10:50   DG Chest Port 1 View  Result Date: 06/30/2022 CLINICAL DATA:  Shortness of breath. EXAM: PORTABLE CHEST 1 VIEW COMPARISON:  June 30, 2022 FINDINGS: The cardiac silhouette is mildly enlarged and unchanged in size. Predominant stable, mild to moderate severity diffusely increased interstitial lung markings are seen. Moderate severity right basilar atelectasis and/or infiltrate is also noted. Mild prominence of the pulmonary vasculature is seen. There is no evidence of a pleural effusion or pneumothorax. There are multiple chronic left-sided rib fractures with marked severity dextroscoliosis of the lower thoracic spine. IMPRESSION: 1. Stable cardiomegaly with moderate severity right basilar atelectasis and/or infiltrate. 2. Chronic interstitial lung disease with mild pulmonary vascular congestion. A superimposed component of mild interstitial edema cannot be excluded. Electronically Signed   By: Virgina Norfolk M.D.   On: 06/30/2022 20:05        Kinzlee Selvy M.D. [facility] 07/02/2022, 2:38 PM  Available via Epic secure chat 7am-7pm After 7 pm, please refer to night coverage provider listed on amion.

## 2022-07-02 NOTE — TOC Initial Note (Signed)
Transition of Care [facility]) - Initial/Assessment Note    Patient Details  Name: Dawn Sherman MRN: [id number] Date of Birth: 1956-03-17  Transition of [facility] CM/SW Contact:    Candie Chroman, LCSW Phone Number: 07/02/2022, 12:28 PM  Clinical Narrative:  Readmission prevention screen complete. CSW met with patient. No supports at bedside. CSW introduced role and explained that discharge planning would be discussed. PCP is [name], MD at [facility]. Patient drives herself to appointments. [facility] [place]. Patient gets her Wilder Glade and Trelegy mailed to her for free. She confirms difficulty affording Eliquis. Pharmacy is aware. Patient lives home with her husband and 52 year old grandson. No home health or DME use prior to admission. She is currently on 2 L acute oxygen. Will follow for this need as well. No further concerns. CSW encouraged patient to contact CSW as needed. CSW will continue to follow patient for support and facilitate return home when stable. Husband will transport her home at discharge.                Expected Discharge Plan: Home/Self Care Barriers to Discharge: Continued Medical Work up   Patient Goals and CMS Choice        Expected Discharge Plan and Services Expected Discharge Plan: Home/Self Care     Post Acute Care Choice: NA Living arrangements for the past 2 months: Single Family Home                                      Prior Living Arrangements/Services Living arrangements for the past 2 months: Single Family Home Lives with:: Spouse, Relatives Patient language and need for interpreter reviewed:: Yes Do you feel safe going back to the place where you live?: Yes      Need for Family Participation in Patient Care: Yes (Comment) Care giver support system in place?: Yes (comment)   Criminal Activity/Legal Involvement Pertinent to Current Situation/Hospitalization: No - Comment as needed  Activities of Daily  Living Home Assistive Devices/Equipment: Eyeglasses ADL Screening (condition at time of admission) Patient's cognitive ability adequate to safely complete daily activities?: Yes Is the patient deaf or have difficulty hearing?: No Does the patient have difficulty seeing, even when wearing glasses/contacts?: No Does the patient have difficulty concentrating, remembering, or making decisions?: No Patient able to express need for assistance with ADLs?: Yes Does the patient have difficulty dressing or bathing?: No Independently performs ADLs?: Yes (appropriate for developmental age) Does the patient have difficulty walking or climbing stairs?: No Weakness of Legs: None Weakness of Arms/Hands: None  Permission Sought/Granted                  Emotional Assessment Appearance:: Appears stated age Attitude/Demeanor/Rapport: Engaged, Gracious Affect (typically observed): Accepting, Appropriate, Calm, Pleasant Orientation: : Oriented to Self, Oriented to Place, Oriented to  Time, Oriented to Situation Alcohol / Substance Use: Not Applicable Psych Involvement: No (comment)  Admission diagnosis:  Acute respiratory failure with hypoxia ([place]) [J96.01] Acute hypoxemic respiratory failure (HCC) [J96.01] Acute on chronic congestive heart failure, unspecified heart failure type ([place]) [I50.9] Patient Active Problem List   Diagnosis Date Noted   Acute hypoxemic respiratory failure ([place]) 06/30/2022   Tachycardia 06/30/2022   Anemia 05/06/2022   Acute on chronic diastolic CHF (congestive heart failure) ([place]) 03/26/2022   CKD (chronic kidney disease), stage IV ([place]) 03/26/2022   Iron deficiency  anemia 03/26/2022   Generalized weakness 12/17/2021   Essential hypertension 12/17/2021   Dyslipidemia 12/17/2021   Depression, major, single episode, complete remission ([place]) 12/17/2021   GERD without esophagitis 12/17/2021   Chronic diastolic CHF (congestive heart failure) ([place]) 12/17/2021   AKI (acute  kidney injury) ([place]) 12/16/2021   Second degree AV block    Syncope and collapse 12/10/2021   History of subarachnoid hemorrhage 12/10/2021   Hypokalemia 12/10/2021   Hyponatremia 12/10/2021   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   Atrial fibrillation with slow ventricular response ([place]) 12/10/2021   Hypotension 12/10/2021   Swelling of lower extremity 10/28/2021   Malnutrition of moderate degree 08/25/2021   Prediabetes 08/19/2021   Chronic obstructive pulmonary disease ([place]) 11/22/2020   Paroxysmal A-fib ([place]) 08/12/2020   Dehydration    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[place])    Acute on chronic combined systolic and diastolic CHF (congestive heart failure) ([place]) 02/10/2020   HLD (hyperlipidemia) 02/10/2020   Elevated troponin 02/10/2020   GERD (gastroesophageal reflux disease) 02/10/2020   Abnormal mammogram 07/13/2019   Sleep concern 10/17/2018   History of alcohol abuse 07/31/2018   B12 deficiency 07/31/2018   Acute renal failure superimposed on stage 3b chronic kidney disease ([place]) 11/03/2016   Health care maintenance 12/02/2014   Tobacco abuse 12/02/2014   Foot pain, left 11/29/2014   Hypercholesterolemia 11/29/2014   Encounter for screening colonoscopy 12/16/2013   Breast mass 12/15/2012   Hypertension 07/05/2012   Depression, major, single episode, mild ([place]) 07/05/2012   PCP:  [name], MD Pharmacy:   [facility] [place] [place] [address] [place] [address] Phone: [phone] Fax: [phone]     Social Determinants of Health (SDOH) Interventions    Readmission Risk Interventions    07/02/2022   12:26 PM 12/11/2021    3:17 PM  Readmission Risk Prevention Plan  Transportation Screening Complete Complete  PCP or Specialist Appt within 3-5 Days  Complete  HRI or [place]  Complete  Social Work Consult for [place] Planning/Counseling  Complete  Palliative Care Screening  Not Applicable   Medication Review Press photographer) Complete Complete  PCP or Specialist appointment within 3-5 days of discharge Complete   SW Recovery Care/Counseling Consult Complete   Palliative Care Screening Not [place] Not Applicable

## 2022-07-02 NOTE — Progress Notes (Addendum)
Progress Note  Patient Name: Dawn Sherman Date of Encounter: 07/02/2022  Primary Cardiologist: Rockey Situ  Subjective   She developed Afib on 07/01/2022 at 22:45 and remains in Afib at this time with ventricular rates largely in the 90s to low 100s bpm, occasionally into the 120s bpm. Feels better this morning. Supplemental oxygen down to 2 L via nasal cannula. No chest pain or palpitations.   Inpatient Medications    Scheduled Meds:  amLODipine  10 mg Oral Daily   apixaban  2.5 mg Oral BID   arformoterol  15 mcg Nebulization BID   atorvastatin  40 mg Oral QHS   budesonide (PULMICORT) nebulizer solution  0.25 mg Nebulization BID   buPROPion  150 mg Oral Daily   calcium carbonate  1 tablet Oral TID WC   ferrous sulfate  325 mg Oral BID WC   furosemide  40 mg Oral Daily   hydrALAZINE  25 mg Oral TID   levalbuterol  0.63 mg Nebulization BID   And   ipratropium  0.5 mg Nebulization BID   methylPREDNISolone (SOLU-MEDROL) injection  40 mg Intravenous Daily   multivitamin with minerals  1 tablet Oral Daily   pantoprazole  40 mg Oral Daily   sodium bicarbonate  650 mg Oral BID   tamsulosin  0.4 mg Oral Daily   venlafaxine XR  150 mg Oral Q breakfast   Continuous Infusions:  azithromycin Stopped (07/01/22 1701)   cefTRIAXone (ROCEPHIN)  IV Stopped (07/01/22 1545)   PRN Meds: acetaminophen **OR** acetaminophen, albuterol, hydrALAZINE, metoprolol tartrate, nicotine, ondansetron **OR** ondansetron (ZOFRAN) IV, senna-docusate   Vital Signs    Vitals:   07/02/22 0450 07/02/22 0716 07/02/22 0754 07/02/22 0815  BP: 115/68  124/73   Pulse: 90  94   Resp: 18  18   Temp: 98.5 F (36.9 C)  98.3 F (36.8 C)   TempSrc:   Oral   SpO2: 95% 96% 94% 93%  Weight:      Height:        Intake/Output Summary (Last 24 hours) at 07/02/2022 1028 Last data filed at 07/02/2022 0500 Gross per 24 hour  Intake --  Output 1325 ml  Net -1325 ml   Filed Weights   06/30/22 1214  Weight: 54.4 kg     Telemetry    Afib developing at 22:45 on 11/6 with ventricular rates largely in the 90s to low 100s bpm, occasionally in the 120s bpm - Personally Reviewed  ECG    No new tracings - Personally Reviewed  Physical Exam   GEN: No acute distress.   Neck: No JVD. Cardiac: IRIR, I/VI systolic murmur at the base, no rubs, or gallops.  Respiratory: Coarse and mildly diminished bilaterally, though improved. GI: Soft, nontender, non-distended.   MS: No edema; No deformity. Neuro:  Alert and oriented x 3; Nonfocal.  Psych: Normal affect.  Labs    Chemistry Recent Labs  Lab 06/30/22 1227 07/01/22 0515 07/02/22 0006  NA 139 141 137  K 4.8 5.1 4.6  CL 110 114* 110  CO2 21* 20* 22  GLUCOSE 85 114* 111*  BUN 38* 41* 52*  CREATININE 2.17* 2.53* 2.78*  CALCIUM 8.9 8.9 8.5*  PROT 7.2  --   --   ALBUMIN 4.1  --   --   AST 34  --   --   ALT 31  --   --   ALKPHOS 112  --   --   BILITOT 1.0  --   --  GFRNONAA 24* 20* 18*  ANIONGAP 8 7 5      Hematology Recent Labs  Lab 06/30/22 1227 07/01/22 0515  WBC 8.5 5.6  RBC 4.34 4.23  HGB 12.3 12.0  HCT 38.7 37.8  MCV 89.2 89.4  MCH 28.3 28.4  MCHC 31.8 31.7  RDW 16.9* 17.0*  PLT 188 160    Cardiac EnzymesNo results for input(s): "TROPONINI" in the last 168 hours. No results for input(s): "TROPIPOC" in the last 168 hours.   BNP Recent Labs  Lab 06/30/22 1227  BNP 1,518.4*     DDimer  Recent Labs  Lab 06/30/22 1227 07/01/22 0653  DDIMER 1.64* 1.06*     Radiology    NM Pulmonary Perfusion  Result Date: 07/01/2022 IMPRESSION: 1. Very low probability for pulmonary embolism by modified perfusion only PIOPED criteria (PE absent). 2.  Cardiomegaly. Electronically Signed   By: Delanna Ahmadi M.D.   On: 07/01/2022 10:50   DG Chest Port 1 View  Result Date: 06/30/2022 IMPRESSION: 1. Stable cardiomegaly with moderate severity right basilar atelectasis and/or infiltrate. 2. Chronic interstitial lung disease with mild  pulmonary vascular congestion. A superimposed component of mild interstitial edema cannot be excluded. Electronically Signed   By: Virgina Norfolk M.D.   On: 06/30/2022 20:05   DG Chest 2 View  Result Date: 06/30/2022 IMPRESSION: Increasing interstitial opacities at the right lung base with trace right pleural effusion. Findings may be related to asymmetric edema or atypical/viral infection. Electronically Signed   By: Davina Poke D.O.   On: 06/30/2022 13:16    Cardiac Studies   2D echo 07/01/2022: 1. Left ventricular ejection fraction, by estimation, is 50 to 55%. The  left ventricle has low normal function. The left ventricle has no regional  wall motion abnormalities. The left ventricular internal cavity size was  mildly dilated. Left ventricular  diastolic parameters are consistent with Grade II diastolic dysfunction  (pseudonormalization).   2. Right ventricular systolic function is normal. The right ventricular  size is normal.   3. Left atrial size was mildly dilated.   4. The mitral valve is normal in structure. Moderate mitral valve  regurgitation. No evidence of mitral stenosis.   5. The aortic valve is normal in structure. Aortic valve regurgitation is  not visualized. Mild aortic valve stenosis. Aortic valve area, by VTI  measures 1.92 cm. Aortic valve mean gradient measures 8.0 mmHg.   6. The inferior vena cava is normal in size with greater than 50%  respiratory variability, suggesting right atrial pressure of 3 mmHg. __________  2D echo 12/11/2021: 1. Left ventricular ejection fraction, by estimation, is 65 to 70%. The  left ventricle has normal function. The left ventricle has no regional  wall motion abnormalities. There is mild left ventricular hypertrophy.  Left ventricular diastolic parameters  are indeterminate.   2. Right ventricular systolic function is normal. The right ventricular  size is normal. There is normal pulmonary artery systolic pressure.    3. Left atrial size was mildly dilated.   4. The mitral valve is degenerative. Mild mitral valve regurgitation. No  evidence of mitral stenosis.   5. The aortic valve has an indeterminant number of cusps. There is mild  thickening of the aortic valve. Aortic valve regurgitation is not  visualized. Aortic valve sclerosis is present, with no evidence of aortic  valve stenosis.   6. The inferior vena cava is normal in size with greater than 50%  respiratory variability, suggesting right atrial pressure of 3 mmHg.  Patient Profile     66 y.o. female with history of HFpEF, PAF not on [place] in the setting of [place] secondary to mechanical fall in 11/2021, CKD stage IV, COPD, and HTN who is being seen today for the evaluation of HFpEF at the request of Dr. Tana Coast.   Assessment & Plan    1. Acute hypoxic respiratory failure: -Multifactorial including COPD exacerbation with possible PNA noted on imaging as well as a small amount of volume overload -VQ low probability for PE -Wean supplemental oxygen as able    2. Acute on chronic HFpEF: -Despite elevated BNP, she does not appear grossly volume up -Suspect there was a component of volume overload, though the majority of her symptoms appear to be pulmonary in etiology, this is also supported by improvement in symptoms despite diuresis  -Echo with preserved LVSF and normal wall motion this admission  -With worsening renal function, hold Lasix -Not a candidate for MRA or SGLT2i with progressive CKD -CHF education -Strict I/O -Daily weights   3. Elevated high sensitivity troponin with abnormal EKG: -Mildly elevated and down trending -Not consistent with ACS -Abnormal EKG dates back to 2021 -Not a good candidate for LHC or coronary CTA with renal dysfunction  -Unable to pursue Lexiscan MPI with known 2nd degree AV block -Echo with preserved LVSF and normal wall motion    4. PAF: -Brief Afib in the ED followed by spontaneous conversion to sinus  rhythm -She redeveloped Afib on 11/6 at 22:45 with ventricular rates in the 90s to low 100s bpm, occasionally into the 120s bpm and remains in Afib at this time with rates in the 90s bpm -Stop amlodipine  -Start short-acting diltiazem 30 mg every 6 hours -CHADS2VASc at least 4 (CHF, HTN, age x 1, sex category) -She was restarted on Eliquis at her OV on 04/08/2022, though had only been taking this once daily due to cost, therefore if DCCV is needed due to difficult to control ventricular rates, she will require a TEE first -Consult care manager to assist with [place]    5. Acute on CKD stage IV: -Avoid nephrotoxic agents -Hold Lasix for today (already received dose this morning), reassess 11/8 -Monitor -Per IM   6. COPD exacerbation with possible PNA: -Suspect primary pulmonary etiology of admission  -Agree with Xopenex in place of albuterol  -Per IM   7. Elevated d-dimer: -VQ low probability for PE -Unable to pursue CTA chest with underlying renal dysfunction  -Cannot exclude PE given she has been subtherapeutic with Eliquis    8. HTN: -Blood pressure reasonably controlled currently    9. HLD: -PTA [place]     For questions or updates, please contact [place] Please consult [email or web address] for contact info under Cardiology/STEMI.    Signed, Christell Faith, PA-C [place] Pager: [phone] 07/02/2022, 10:28 AM

## 2022-07-03 ENCOUNTER — Other Ambulatory Visit (HOSPITAL_COMMUNITY): Payer: Self-pay

## 2022-07-03 ENCOUNTER — Ambulatory Visit: Payer: Medicare Other | Admitting: Cardiology

## 2022-07-03 DIAGNOSIS — J9601 Acute respiratory failure with hypoxia: Secondary | ICD-10-CM | POA: Diagnosis not present

## 2022-07-03 DIAGNOSIS — I480 Paroxysmal atrial fibrillation: Secondary | ICD-10-CM | POA: Diagnosis not present

## 2022-07-03 DIAGNOSIS — I5032 Chronic diastolic (congestive) heart failure: Secondary | ICD-10-CM

## 2022-07-03 DIAGNOSIS — I4819 Other persistent atrial fibrillation: Secondary | ICD-10-CM

## 2022-07-03 DIAGNOSIS — J432 Centrilobular emphysema: Secondary | ICD-10-CM | POA: Diagnosis not present

## 2022-07-03 DIAGNOSIS — I10 Essential (primary) hypertension: Secondary | ICD-10-CM

## 2022-07-03 LAB — BASIC METABOLIC PANEL
Anion gap: 5 (ref 5–15)
BUN: 57 mg/dL — ABNORMAL HIGH (ref 8–23)
CO2: 23 mmol/L (ref 22–32)
Calcium: 9.2 mg/dL (ref 8.9–10.3)
Chloride: 111 mmol/L (ref 98–111)
Creatinine, Ser: 2.57 mg/dL — ABNORMAL HIGH (ref 0.44–1.00)
GFR, Estimated: 20 mL/min — ABNORMAL LOW (ref >60)
Glucose, Bld: 101 mg/dL — ABNORMAL HIGH (ref 70–99)
Potassium: 4.5 mmol/L (ref 3.5–5.1)
Sodium: 139 mmol/L (ref 135–145)

## 2022-07-03 MED ORDER — PREDNISONE 20 MG PO TABS: 40.0000 mg | ORAL_TABLET | Freq: Every day | ORAL | Status: DC

## 2022-07-03 MED ORDER — UMECLIDINIUM BROMIDE 62.5 MCG/ACT IN AEPB
1.0000 | INHALATION_SPRAY | Freq: Every day | RESPIRATORY_TRACT | Status: DC
  Administered 2022-07-03 – 2022-07-05 (×3): 1 via RESPIRATORY_TRACT
  Filled 2022-07-03: qty 7

## 2022-07-03 MED ORDER — CHLORHEXIDINE GLUCONATE CLOTH 2 % EX PADS
6.0000 | MEDICATED_PAD | Freq: Every day | CUTANEOUS | Status: DC
  Administered 2022-07-03 – 2022-07-04 (×2): 6 via TOPICAL

## 2022-07-03 MED ORDER — AZITHROMYCIN 250 MG PO TABS
500.0000 mg | ORAL_TABLET | ORAL | Status: DC
  Administered 2022-07-03: 500 mg via ORAL
  Filled 2022-07-03: qty 2

## 2022-07-03 MED ORDER — FLUTICASONE FUROATE-VILANTEROL 100-25 MCG/ACT IN AEPB
1.0000 | INHALATION_SPRAY | Freq: Every day | RESPIRATORY_TRACT | Status: DC
  Administered 2022-07-03 – 2022-07-05 (×3): 1 via RESPIRATORY_TRACT
  Filled 2022-07-03: qty 28

## 2022-07-03 MED ORDER — DILTIAZEM HCL ER COATED BEADS 120 MG PO CP24
120.0000 mg | ORAL_CAPSULE | Freq: Every day | ORAL | Status: DC
  Administered 2022-07-03 – 2022-07-05 (×3): 120 mg via ORAL
  Filled 2022-07-03 (×3): qty 1

## 2022-07-03 MED ORDER — FUROSEMIDE 40 MG PO TABS
40.0000 mg | ORAL_TABLET | Freq: Every day | ORAL | Status: DC
  Administered 2022-07-04 – 2022-07-05 (×2): 40 mg via ORAL
  Filled 2022-07-03 (×2): qty 1

## 2022-07-03 NOTE — Care Management Important Message (Signed)
Important Message  Patient Details  Name: Dawn Sherman MRN: [id number] Date of Birth: 1956/02/23   Medicare Important Message Given:  Yes     [name] 07/03/2022, 11:38 AM

## 2022-07-03 NOTE — TOC Benefit Eligibility Note (Signed)
Patient Teacher, English as a foreign language completed.    The patient is currently admitted and upon discharge could be taking Eliquis 2.5 mg.  The current 30 day co-pay is 151.46 due to being in Coverage Gap (donut hole).   The patient is insured through [place], [place] Patient Advocate Specialist [place] Patient Advocate Team Direct Number: [phone]  Fax: [phone]

## 2022-07-03 NOTE — Progress Notes (Signed)
Nutrition Follow-up  DOCUMENTATION CODES:   Non-severe (moderate) malnutrition in context of chronic illness  INTERVENTION:   -Continue 2 gram sodium diet -Continue MVI with minerals daily -Continue Magic cup TID with meals, each supplement provides 290 kcal and 9 grams of protein   NUTRITION DIAGNOSIS:   Moderate Malnutrition related to chronic illness (CHF, COPD) as evidenced by mild fat depletion, moderate fat depletion, mild muscle depletion, moderate muscle depletion.  Ongoing  GOAL:   Patient will meet greater than or equal to 90% of their needs  Progressing   MONITOR:   PO intake, Supplement acceptance  REASON FOR ASSESSMENT:   Rounds    ASSESSMENT:   Pt with history of hypertension, GERD, hypertension, COPD not requiring O2 supplementation at baseline, tobacco use, CKD stage IV, paroxysmal atrial fibrillation on Eliquis, depression, anxiety, who presents for chief concerns of shortness of breath.  Reviewed I/O's: -2.8 L x 24 hours and -4.1 L since admission  UOP: 3.1 L x 24 hours  Spoke with pt at bedside, who was pleasant and in good spirits today. She reports feeling much better. Per pt, she has a poor appetite at home and consumes 3 meals per day (Breakfast: cereal, Lunch: sandwich, Dinner: ,eat, starch, and vegetable).  Pt reports eating most of her meals here; documented meal completions 80%. Pt states "I definitely eat way more here than I do at home; I'm spoiled here". Pt enjoys the [employer] and would like to continue them.   Pt denies any weight loss. She reports her UBW is around 120#. Reviewed wt hx; wt has been stable over the past 3 months.   Discussed importance of good meal and supplement intake to promote healing.  Medications reviewed and include calcium carbonate, cardizem, and ferrous sulfate.   Labs reviewed: CBGS: 103 (inpatient orders for glycemic control are ).    NUTRITION - FOCUSED PHYSICAL EXAM:  Flowsheet Row Most Recent Value   Orbital Region Moderate depletion  Upper Arm Region Mild depletion  Thoracic and Lumbar Region Mild depletion  Buccal Region Mild depletion  Temple Region Moderate depletion  Clavicle Bone Region Moderate depletion  Clavicle and Acromion Bone Region Moderate depletion  Scapular Bone Region Moderate depletion  Dorsal Hand Mild depletion  Patellar Region Moderate depletion  Anterior Thigh Region Moderate depletion  Posterior Calf Region Moderate depletion  Edema (RD Assessment) None  Hair Reviewed  Eyes Reviewed  Mouth Reviewed  Skin Reviewed  Nails Reviewed       Diet Order:   Diet Order             Diet 2 gram sodium Room service appropriate? Yes; Fluid consistency: Thin  Diet effective now                   EDUCATION NEEDS:   Education needs have been addressed  Skin:  Skin Assessment: Reviewed RN Assessment  Last BM:  06/30/22  Height:   Ht Readings from Last 1 Encounters:  06/30/22 5\' 5"  (1.651 m)    Weight:   Wt Readings from Last 1 Encounters:  06/30/22 54.4 kg    Ideal Body Weight:  56.8 kg  BMI:  Body mass index is 19.97 kg/m.  Estimated Nutritional Needs:   Kcal:  1650-1850  Protein:  85-100 grams  Fluid:  > 1.6 L    Loistine Chance, RD, LDN, [place] Registered Dietitian II Certified Diabetes Care and Education Specialist Please refer to [facility] for RD and/or RD on-call/weekend/after hours pager

## 2022-07-03 NOTE — Plan of Care (Signed)

## 2022-07-03 NOTE — Progress Notes (Signed)
Rounding Note    Patient Name: Dawn Sherman Date of Encounter: 07/03/2022  [place] Cardiologist: Ida Rogue, MD   Subjective   Feels well, denies chest pain or shortness of breath, has not ambulated much today.  Inpatient Medications    Scheduled Meds:  apixaban  2.5 mg Oral BID   atorvastatin  40 mg Oral QHS   buPROPion  150 mg Oral Daily   calcium carbonate  1 tablet Oral TID WC   Chlorhexidine Gluconate Cloth  6 each Topical Daily   diltiazem  30 mg Oral Q6H   ferrous sulfate  325 mg Oral BID WC   fluticasone furoate-vilanterol  1 puff Inhalation Daily   hydrALAZINE  25 mg Oral TID   levalbuterol  0.63 mg Nebulization BID   And   ipratropium  0.5 mg Nebulization BID   methylPREDNISolone (SOLU-MEDROL) injection  40 mg Intravenous Daily   multivitamin with minerals  1 tablet Oral Daily   pantoprazole  40 mg Oral Daily   sodium bicarbonate  650 mg Oral BID   tamsulosin  0.4 mg Oral Daily   umeclidinium bromide  1 puff Inhalation Daily   venlafaxine XR  150 mg Oral Q breakfast   Continuous Infusions:  azithromycin 500 mg (07/02/22 1733)   cefTRIAXone (ROCEPHIN)  IV 1 g (07/02/22 1554)   PRN Meds: acetaminophen **OR** acetaminophen, albuterol, hydrALAZINE, metoprolol tartrate, nicotine, ondansetron **OR** ondansetron (ZOFRAN) IV, senna-docusate   Vital Signs    Vitals:   07/03/22 0503 07/03/22 0748 07/03/22 0800 07/03/22 1107  BP: 131/68  129/66 127/60  Pulse: 81 83 81 96  Resp: 16 16 19 20   Temp: 98.1 F (36.7 C)  98.2 F (36.8 C) 98.7 F (37.1 C)  TempSrc:    Oral  SpO2: 97% 96% 100% 95%  Weight:      Height:        Intake/Output Summary (Last 24 hours) at 07/03/2022 1300 Last data filed at 07/03/2022 1000 Gross per 24 hour  Intake 320 ml  Output 3775 ml  Net -3455 ml      06/30/2022   12:14 PM 06/08/2022   10:46 AM 05/06/2022    9:01 AM  Last 3 Weights  Weight (lbs) 120 lb 130 lb 122 lb 6.4 oz  Weight (kg) 54.432 kg 58.968  kg 55.52 kg      Telemetry    Atrial fibrillation, heart rate 83- Personally Reviewed  ECG     - Personally Reviewed  Physical Exam   GEN: No acute distress.   Neck: No JVD Cardiac: Irregularly irregular Respiratory: Decreased breath sounds at bases GI: Soft, nontender, non-distended  MS: No edema; No deformity. Neuro:  Nonfocal  Psych: Normal affect   Labs    High Sensitivity Troponin:   Recent Labs  Lab 06/30/22 1227 06/30/22 1514  TROPONINIHS 59* 55*     Chemistry Recent Labs  Lab 06/30/22 1227 07/01/22 0515 07/02/22 0006 07/03/22 0544  NA 139 141 137 139  K 4.8 5.1 4.6 4.5  CL 110 114* 110 111  CO2 21* 20* 22 23  GLUCOSE 85 114* 111* 101*  BUN 38* 41* 52* 57*  CREATININE 2.17* 2.53* 2.78* 2.57*  CALCIUM 8.9 8.9 8.5* 9.2  PROT 7.2  --   --   --   ALBUMIN 4.1  --   --   --   AST 34  --   --   --   ALT 31  --   --   --  ALKPHOS 112  --   --   --   BILITOT 1.0  --   --   --   GFRNONAA 24* 20* 18* 20*  ANIONGAP 8 7 5 5     Lipids No results for input(s): "CHOL", "TRIG", "HDL", "LABVLDL", "LDLCALC", "CHOLHDL" in the last 168 hours.  Hematology Recent Labs  Lab 06/30/22 1227 07/01/22 0515  WBC 8.5 5.6  RBC 4.34 4.23  HGB 12.3 12.0  HCT 38.7 37.8  MCV 89.2 89.4  MCH 28.3 28.4  MCHC 31.8 31.7  RDW 16.9* 17.0*  PLT 188 160   Thyroid No results for input(s): "TSH", "FREET4" in the last 168 hours.  BNP Recent Labs  Lab 06/30/22 1227  BNP 1,518.4*    DDimer  Recent Labs  Lab 06/30/22 1227 07/01/22 0653  DDIMER 1.64* 1.06*     Radiology    ECHOCARDIOGRAM COMPLETE  Result Date: 07/01/2022    ECHOCARDIOGRAM REPORT   Patient Name:   EDWYNA DANGERFIELD Date of Exam: 07/01/2022 Medical Rec #:  [id number]     Height:       65.0 in Accession #:    [id number]    Weight:       120.0 lb Date of Birth:  1956-05-19    BSA:          1.592 m Patient Age:    19 years      BP:           137/66 mmHg Patient Gender: F             HR:           99 bpm. Exam  Location:  [facility] Procedure: 2D Echo, Color Doppler, Cardiac Doppler and Strain Analysis Indications:     R06.00 Dyspnea  History:         Patient has prior history of Echocardiogram examinations, most                  recent 12/11/2021. CHF, CKD and COPD; Risk Factors:Hypertension,                  Dyslipidemia and Current Smoker.  Sonographer:     Charmayne Sheer Referring Phys:  [id number] Rise Mu Diagnosing Phys: Kathlyn Sacramento MD  Sonographer Comments: Global longitudinal strain was attempted. IMPRESSIONS  1. Left ventricular ejection fraction, by estimation, is 50 to 55%. The left ventricle has low normal function. The left ventricle has no regional wall motion abnormalities. The left ventricular internal cavity size was mildly dilated. Left ventricular diastolic parameters are consistent with Grade II diastolic dysfunction (pseudonormalization).  2. Right ventricular systolic function is normal. The right ventricular size is normal.  3. Left atrial size was mildly dilated.  4. The mitral valve is normal in structure. Moderate mitral valve regurgitation. No evidence of mitral stenosis.  5. The aortic valve is normal in structure. Aortic valve regurgitation is not visualized. Mild aortic valve stenosis. Aortic valve area, by VTI measures 1.92 cm. Aortic valve mean gradient measures 8.0 mmHg.  6. The inferior vena cava is normal in size with greater than 50% respiratory variability, suggesting right atrial pressure of 3 mmHg. FINDINGS  Left Ventricle: Left ventricular ejection fraction, by estimation, is 50 to 55%. The left ventricle has low normal function. The left ventricle has no regional wall motion abnormalities. Global longitudinal strain performed but not reported based on interpreter judgement due to suboptimal tracking. The left ventricular internal cavity size was mildly dilated. There is  no left ventricular hypertrophy. Left ventricular diastolic parameters are consistent with Grade II diastolic dysfunction  (pseudonormalization). Right Ventricle: The right ventricular size is normal. No increase in right ventricular wall thickness. Right ventricular systolic function is normal. Left Atrium: Left atrial size was mildly dilated. Right Atrium: Right atrial size was normal in size. Pericardium: There is no evidence of pericardial effusion. Mitral Valve: The mitral valve is normal in structure. There is moderate thickening of the mitral valve leaflet(s). There is mild calcification of the mitral valve leaflet(s). Moderate mitral valve regurgitation. No evidence of mitral valve stenosis. Tricuspid Valve: The tricuspid valve is normal in structure. Tricuspid valve regurgitation is not demonstrated. No evidence of tricuspid stenosis. Aortic Valve: The aortic valve is normal in structure. Aortic valve regurgitation is not visualized. Mild aortic stenosis is present. Aortic valve mean gradient measures 8.0 mmHg. Aortic valve peak gradient measures 12.8 mmHg. Aortic valve area, by VTI measures 1.92 cm. Pulmonic Valve: The pulmonic valve was normal in structure. Pulmonic valve regurgitation is not visualized. No evidence of pulmonic stenosis. Aorta: The aortic root is normal in size and structure. Venous: The inferior vena cava is normal in size with greater than 50% respiratory variability, suggesting right atrial pressure of 3 mmHg. IAS/Shunts: No atrial level shunt detected by color flow Doppler.  LEFT VENTRICLE PLAX 2D LVIDd:         5.20 cm   Diastology LVIDs:         4.00 cm   LV e' medial:    7.18 cm/s LV PW:         1.10 cm   LV E/e' medial:  21.4 LV IVS:        0.70 cm   LV e' lateral:   12.80 cm/s LVOT diam:     1.80 cm   LV E/e' lateral: 12.0 LV SV:         63 LV SV Index:   40 LVOT Area:     2.54 cm  RIGHT VENTRICLE RV Basal diam:  3.70 cm RV S prime:     13.20 cm/s LEFT ATRIUM             Index        RIGHT ATRIUM           Index LA diam:        5.10 cm 3.20 cm/m   RA Area:     15.40 cm LA Vol (A2C):   62.6 ml  39.32 ml/m  RA Volume:   39.30 ml  24.68 ml/m LA Vol (A4C):   52.4 ml 32.91 ml/m LA Biplane Vol: 58.6 ml 36.81 ml/m  AORTIC VALVE                     PULMONIC VALVE AV Area (Vmax):    1.82 cm      PV Vmax:       1.44 m/s AV Area (Vmean):   1.65 cm      PV Vmean:      102.000 cm/s AV Area (VTI):     1.92 cm      PV VTI:        0.258 m AV Vmax:           179.00 cm/s   PV Peak grad:  8.3 mmHg AV Vmean:          136.000 cm/s  PV Mean grad:  5.0 mmHg AV VTI:  0.329 m AV Peak Grad:      12.8 mmHg AV Mean Grad:      8.0 mmHg LVOT Vmax:         128.00 cm/s LVOT Vmean:        88.300 cm/s LVOT VTI:          0.248 m LVOT/AV VTI ratio: 0.75  AORTA Ao Root diam: 3.10 cm MITRAL VALVE MV Area (PHT): 6.32 cm     SHUNTS MV Decel Time: 120 msec     Systemic VTI:  0.25 m MV E velocity: 154.00 cm/s  Systemic Diam: 1.80 cm MV A velocity: 143.00 cm/s MV E/A ratio:  1.08 Kathlyn Sacramento MD Electronically signed by Kathlyn Sacramento MD Signature Date/Time: 07/01/2022/3:03:52 PM    Final     Cardiac Studies   TTE 07/01/2022 1. Left ventricular ejection fraction, by estimation, is 50 to 55%. The  left ventricle has low normal function. The left ventricle has no regional  wall motion abnormalities. The left ventricular internal cavity size was  mildly dilated. Left ventricular  diastolic parameters are consistent with Grade II diastolic dysfunction  (pseudonormalization).   2. Right ventricular systolic function is normal. The right ventricular  size is normal.   3. Left atrial size was mildly dilated.   4. The mitral valve is normal in structure. Moderate mitral valve  regurgitation. No evidence of mitral stenosis.   5. The aortic valve is normal in structure. Aortic valve regurgitation is  not visualized. Mild aortic valve stenosis. Aortic valve area, by VTI  measures 1.92 cm. Aortic valve mean gradient measures 8.0 mmHg.   6. The inferior vena cava is normal in size with greater than 50%  respiratory  variability, suggesting right atrial pressure of 3 mmHg.   Patient Profile     66 y.o. female with history of HFpEF, paroxysmal atrial fibrillation, CKD 4, COPD presenting with shortness of breath with exertion, being seen for HFpEF and atrial fibrillation  Assessment & Plan    HFpEF -Appears euvolemic, net -2.7 L over the past 24 hours -Creatinine improving with holding Lasix -Encouraged to ambulate today in order to adequately assess dyspnea. -Plan to restart Lasix 40 mg daily tomorrow.  2. Paroxysmal A-fib -Still in A-fib, heart rate controlled -Consolidate Cardizem to 120 mg daily, continue Eliquis 2.5 mg twice daily  3. Htn Bp controlled on cardizem.   Total encounter time more than 50 minutes  Greater than 50% was spent in counseling and coordination of care with the patient        Signed, Kate Sable, MD  07/03/2022, 1:00 PM

## 2022-07-03 NOTE — Plan of Care (Signed)
  Problem: Education: Goal: Knowledge of General Education information will improve Description: Including pain rating scale, medication(s)/side effects and non-pharmacologic comfort measures 07/03/2022 0158 by Zadie Rhine, RN Outcome: Progressing 07/03/2022 0126 by Zadie Rhine, RN Outcome: Progressing   Problem: Health Behavior/Discharge Planning: Goal: Ability to manage health-related needs will improve 07/03/2022 0158 by Zadie Rhine, RN Outcome: Progressing 07/03/2022 0126 by Zadie Rhine, RN Outcome: Progressing   Problem: Clinical Measurements: Goal: Ability to maintain clinical measurements within normal limits will improve 07/03/2022 0158 by Zadie Rhine, RN Outcome: Progressing 07/03/2022 0126 by Zadie Rhine, RN Outcome: Progressing Goal: Will remain free from infection 07/03/2022 0158 by Zadie Rhine, RN Outcome: Progressing 07/03/2022 0126 by Zadie Rhine, RN Outcome: Progressing

## 2022-07-03 NOTE — Progress Notes (Signed)
Progress Note    Dawn Sherman   MPN:[id number]  DOB: 06/17/1956  DOA: 06/30/2022     3 PCP: [name], MD  Initial CC: Shortness of breath  Hospital Course: Dawn Sherman is a 66 year old female with history of hypertension, GERD, hypertension, COPD not requiring O2 supplementation at baseline, tobacco use, CKD stage IV, paroxysmal atrial fibrillation on Eliquis, depression, anxiety, who presented to the ER with SOB. Work-up was notable for revealing A-fib with RVR, CHF exacerbation.  Cardiology was consulted on admission.  Patient was started on diuresis.  There was also concern for possible underlying COPD exacerbation and she was initiated on antibiotics as well.  Interval History:  No events overnight.  Resting comfortably in bed when seen this morning.  Breathing remains improved as well.  Assessment and Plan:  Acute on chronic dCHF -Pulmonary crackles on admission, pulmonary edema, DOE/SOB -Suspected volume overload on admission.  CXR showing right pleural effusion and pulmonary edema -Echo obtained on admission: EF 50 to 00%, grade 2 diastolic dysfunction, no RWMA. -Cardiology following, appreciate assistance - Continue diuresis as per cardiology - Continue trending BMP as well  Acute hypoxic respiratory failure -Suspected due to underlying CHF exacerbation - Continue weaning O2 as able.  Not on oxygen at home - See CHF treatment  COPD - questionable exac on admission; now has completed azithro and rocephin x 4 days - stop abx and monitor off - continue breathing treatments as needed - VQ negative for PE - complete steroid course   Paroxysmal A-fib ([place]) with RVR -Cardiology consulted, continue eliquis - continue diltiazem as per cardiology -Per cardiology, if DCCV needed, will need TEE first.   CKD (chronic kidney disease), stage IV ([place]) -Continue to monitor renal function.  Baseline creatinine 2-2.5 -Patient was placed on IV Lasix, held due to  worsening renal function -Placed on sodium bicarb 650 mg twice daily    Hypertension -BP stable   HLD (hyperlipidemia) -Continue Lipitor   Tobacco abuse -Counseled on smoking cessation   GERD (gastroesophageal reflux disease) -Continue PPI   Depression -Continue Wellbutrin, Effexor  Old records reviewed in assessment of this patient  DVT prophylaxis:  apixaban (ELIQUIS) tablet 2.5 mg Start: 06/30/22 2200 Place TED hose Start: 06/30/22 1434 apixaban (ELIQUIS) tablet 2.5 mg   Code Status:   Code Status: Full Code  Mobility Assessment (last 72 hours)     Mobility Assessment     Row Name 07/03/22 0810 07/02/22 2200 07/02/22 0935 07/01/22 11:51:53     Does patient have an order for bedrest or is patient medically unstable No - Continue assessment No - Continue assessment No - Continue assessment No - Continue assessment    What is the highest level of mobility based on the progressive mobility assessment? Level 6 (Walks independently in room and hall) - Balance while walking in room without assist - Complete Level 6 (Walks independently in room and hall) - Balance while walking in room without assist - Complete Level 6 (Walks independently in room and hall) - Balance while walking in room without assist - Complete Level 6 (Walks independently in room and hall) - Balance while walking in room without assist - Complete             Barriers to discharge:  Disposition Plan:  Home Status is: Inpt  Objective: Blood pressure 135/70, pulse (!) 109, temperature 98.8 F (37.1 C), resp. rate 20, height 5\' 5"  (1.651 m), weight 54.4 kg, SpO2 96 %.  Examination:  Physical Exam Constitutional:      Appearance: She is well-developed.  HENT:     Head: Normocephalic and atraumatic.     Mouth/Throat:     Mouth: Mucous membranes are moist.  Eyes:     Extraocular Movements: Extraocular movements intact.  Cardiovascular:     Rate and Rhythm: Normal rate.  Pulmonary:     Effort:  Pulmonary effort is normal.     Breath sounds: Normal breath sounds.  Abdominal:     General: Bowel sounds are normal. There is no distension.     Palpations: Abdomen is soft.     Tenderness: There is no abdominal tenderness.  Musculoskeletal:        General: Normal range of motion.     Cervical back: Normal range of motion and neck supple.  Skin:    General: Skin is warm.  Neurological:     General: No focal deficit present.     Mental Status: She is alert.  Psychiatric:        Mood and Affect: Mood normal.      Consultants:  Cardiology  Procedures:    Data Reviewed: Results for orders placed or performed during the hospital encounter of 06/30/22 (from the past 24 hour(s))  Basic metabolic panel     Status: Abnormal   Collection Time: 07/03/22  5:44 AM  Result Value Ref Range   Sodium 139 135 - 145 mmol/L   Potassium 4.5 3.5 - 5.1 mmol/L   Chloride 111 98 - 111 mmol/L   CO2 23 22 - 32 mmol/L   Glucose, Bld 101 (H) 70 - 99 mg/dL   BUN 57 (H) 8 - 23 mg/dL   Creatinine, Ser 2.57 (H) 0.44 - 1.00 mg/dL   Calcium 9.2 8.9 - 10.3 mg/dL   GFR, Estimated 20 (L) >60 mL/min   Anion gap 5 5 - 15    I have Reviewed nursing notes, Vitals, and Lab results since pt's last encounter. Pertinent lab results : see above I have ordered test including BMP, CBC, Mg I have reviewed the last note from staff over past 24 hours I have discussed pt's care plan and test results with nursing staff, case manager   LOS: 3 days   Dwyane Dee, MD [facility] 07/03/2022, 5:05 PM

## 2022-07-04 DIAGNOSIS — N184 Chronic kidney disease, stage 4 (severe): Secondary | ICD-10-CM | POA: Diagnosis not present

## 2022-07-04 DIAGNOSIS — J9601 Acute respiratory failure with hypoxia: Secondary | ICD-10-CM | POA: Diagnosis not present

## 2022-07-04 DIAGNOSIS — I480 Paroxysmal atrial fibrillation: Secondary | ICD-10-CM | POA: Diagnosis not present

## 2022-07-04 DIAGNOSIS — J432 Centrilobular emphysema: Secondary | ICD-10-CM | POA: Diagnosis not present

## 2022-07-04 DIAGNOSIS — I509 Heart failure, unspecified: Secondary | ICD-10-CM

## 2022-07-04 LAB — MAGNESIUM: Magnesium: 2.5 mg/dL — ABNORMAL HIGH (ref 1.7–2.4)

## 2022-07-04 LAB — CBC WITH DIFFERENTIAL/PLATELET
Abs Immature Granulocytes: 0.04 10*3/uL (ref 0.00–0.07)
Basophils Absolute: 0 10*3/uL (ref 0.0–0.1)
Basophils Relative: 0 %
Eosinophils Absolute: 0 10*3/uL (ref 0.0–0.5)
Eosinophils Relative: 0 %
HCT: 36.5 % (ref 36.0–46.0)
Hemoglobin: 11.9 g/dL — ABNORMAL LOW (ref 12.0–15.0)
Immature Granulocytes: 0 %
Lymphocytes Relative: 14 %
Lymphs Abs: 1.3 10*3/uL (ref 0.7–4.0)
MCH: 28.9 pg (ref 26.0–34.0)
MCHC: 32.6 g/dL (ref 30.0–36.0)
MCV: 88.6 fL (ref 80.0–100.0)
Monocytes Absolute: 0.8 10*3/uL (ref 0.1–1.0)
Monocytes Relative: 8 %
Neutro Abs: 7 10*3/uL (ref 1.7–7.7)
Neutrophils Relative %: 78 %
Platelets: 176 10*3/uL (ref 150–400)
RBC: 4.12 MIL/uL (ref 3.87–5.11)
RDW: 16.8 % — ABNORMAL HIGH (ref 11.5–15.5)
WBC: 9.1 10*3/uL (ref 4.0–10.5)
nRBC: 0 % (ref 0.0–0.2)

## 2022-07-04 LAB — BASIC METABOLIC PANEL
Anion gap: 6 (ref 5–15)
BUN: 59 mg/dL — ABNORMAL HIGH (ref 8–23)
CO2: 21 mmol/L — ABNORMAL LOW (ref 22–32)
Calcium: 9.1 mg/dL (ref 8.9–10.3)
Chloride: 112 mmol/L — ABNORMAL HIGH (ref 98–111)
Creatinine, Ser: 2.52 mg/dL — ABNORMAL HIGH (ref 0.44–1.00)
GFR, Estimated: 20 mL/min — ABNORMAL LOW (ref >60)
Glucose, Bld: 101 mg/dL — ABNORMAL HIGH (ref 70–99)
Potassium: 5.2 mmol/L — ABNORMAL HIGH (ref 3.5–5.1)
Sodium: 139 mmol/L (ref 135–145)

## 2022-07-04 LAB — POTASSIUM: Potassium: 5.5 mmol/L — ABNORMAL HIGH (ref 3.5–5.1)

## 2022-07-04 MED ORDER — AMIODARONE HCL 200 MG PO TABS
400.0000 mg | ORAL_TABLET | Freq: Two times a day (BID) | ORAL | Status: DC
  Administered 2022-07-04 – 2022-07-05 (×3): 400 mg via ORAL
  Filled 2022-07-04 (×3): qty 2

## 2022-07-04 MED ORDER — POLYETHYLENE GLYCOL 3350 17 G PO PACK
17.0000 g | PACK | Freq: Every day | ORAL | Status: DC
  Administered 2022-07-04 – 2022-07-05 (×2): 17 g via ORAL
  Filled 2022-07-04 (×2): qty 1

## 2022-07-04 MED ORDER — LACTULOSE 10 GM/15ML PO SOLN: 30.0000 g | Freq: Two times a day (BID) | ORAL | Status: DC | PRN

## 2022-07-04 MED ORDER — SENNOSIDES-DOCUSATE SODIUM 8.6-50 MG PO TABS
1.0000 | ORAL_TABLET | Freq: Two times a day (BID) | ORAL | Status: DC
  Administered 2022-07-04 – 2022-07-05 (×3): 1 via ORAL
  Filled 2022-07-04 (×3): qty 1

## 2022-07-04 NOTE — Progress Notes (Addendum)
Rounding Note    Patient Name: Dawn Sherman Date of Encounter: 07/04/2022  [place] Cardiologist: Ida Rogue, MD   Subjective   UOP -2.5L. kidney function stable. Patient is now off O2. She denies chest pain or shortness of breath. She is in and out of Afib.   Inpatient Medications    Scheduled Meds:  apixaban  2.5 mg Oral BID   atorvastatin  40 mg Oral QHS   buPROPion  150 mg Oral Daily   calcium carbonate  1 tablet Oral TID WC   Chlorhexidine Gluconate Cloth  6 each Topical Daily   diltiazem  120 mg Oral Daily   ferrous sulfate  325 mg Oral BID WC   fluticasone furoate-vilanterol  1 puff Inhalation Daily   furosemide  40 mg Oral Daily   hydrALAZINE  25 mg Oral TID   levalbuterol  0.63 mg Nebulization BID   And   ipratropium  0.5 mg Nebulization BID   multivitamin with minerals  1 tablet Oral Daily   pantoprazole  40 mg Oral Daily   sodium bicarbonate  650 mg Oral BID   tamsulosin  0.4 mg Oral Daily   umeclidinium bromide  1 puff Inhalation Daily   venlafaxine XR  150 mg Oral Q breakfast   Continuous Infusions:  PRN Meds: acetaminophen **OR** acetaminophen, albuterol, metoprolol tartrate, nicotine, ondansetron **OR** ondansetron (ZOFRAN) IV, senna-docusate   Vital Signs    Vitals:   07/04/22 0445 07/04/22 0800 07/04/22 0843 07/04/22 1118  BP: 112/72  127/72 127/65  Pulse: (!) 53  75 91  Resp: 20  16 18   Temp: 98.4 F (36.9 C)  97.9 F (36.6 C) 98.1 F (36.7 C)  TempSrc: Oral  Oral Oral  SpO2: 96% 97% 100%   Weight:      Height:        Intake/Output Summary (Last 24 hours) at 07/04/2022 1124 Last data filed at 07/04/2022 0300 Gross per 24 hour  Intake 240 ml  Output 1850 ml  Net -1610 ml      06/30/2022   12:14 PM 06/08/2022   10:46 AM 05/06/2022    9:01 AM  Last 3 Weights  Weight (lbs) 120 lb 130 lb 122 lb 6.4 oz  Weight (kg) 54.432 kg 58.968 kg 55.52 kg      Telemetry    Afib/flutter, Hr 80s - Personally Reviewed  ECG     No new - Personally Reviewed  Physical Exam   GEN: No acute distress.   Neck: No JVD Cardiac: Irreg Irreg, no murmurs, rubs, or gallops.  Respiratory:diminished at bases. GI: Soft, nontender, non-distended  MS: No edema; No deformity. Neuro:  Nonfocal  Psych: Normal affect   Labs    High Sensitivity Troponin:   Recent Labs  Lab 06/30/22 1227 06/30/22 1514  TROPONINIHS 59* 55*     Chemistry Recent Labs  Lab 06/30/22 1227 07/01/22 0515 07/02/22 0006 07/03/22 0544 07/04/22 0624  NA 139   < > 137 139 139  K 4.8   < > 4.6 4.5 5.2*  CL 110   < > 110 111 112*  CO2 21*   < > 22 23 21*  GLUCOSE 85   < > 111* 101* 101*  BUN 38*   < > 52* 57* 59*  CREATININE 2.17*   < > 2.78* 2.57* 2.52*  CALCIUM 8.9   < > 8.5* 9.2 9.1  MG  --   --   --   --  2.5*  PROT 7.2  --   --   --   --   ALBUMIN 4.1  --   --   --   --   AST 34  --   --   --   --   ALT 31  --   --   --   --   ALKPHOS 112  --   --   --   --   BILITOT 1.0  --   --   --   --   GFRNONAA 24*   < > 18* 20* 20*  ANIONGAP 8   < > 5 5 6    < > = values in this interval not displayed.    Lipids No results for input(s): "CHOL", "TRIG", "HDL", "LABVLDL", "LDLCALC", "CHOLHDL" in the last 168 hours.  Hematology Recent Labs  Lab 06/30/22 1227 07/01/22 0515 07/04/22 0624  WBC 8.5 5.6 9.1  RBC 4.34 4.23 4.12  HGB 12.3 12.0 11.9*  HCT 38.7 37.8 36.5  MCV 89.2 89.4 88.6  MCH 28.3 28.4 28.9  MCHC 31.8 31.7 32.6  RDW 16.9* 17.0* 16.8*  PLT 188 160 176   Thyroid No results for input(s): "TSH", "FREET4" in the last 168 hours.  BNP Recent Labs  Lab 06/30/22 1227  BNP 1,518.4*    DDimer  Recent Labs  Lab 06/30/22 1227 07/01/22 0653  DDIMER 1.64* 1.06*     Radiology    No results found.  Cardiac Studies   TTE 07/01/2022 1. Left ventricular ejection fraction, by estimation, is 50 to 55%. The  left ventricle has low normal function. The left ventricle has no regional  wall motion abnormalities. The left  ventricular internal cavity size was  mildly dilated. Left ventricular  diastolic parameters are consistent with Grade II diastolic dysfunction  (pseudonormalization).   2. Right ventricular systolic function is normal. The right ventricular  size is normal.   3. Left atrial size was mildly dilated.   4. The mitral valve is normal in structure. Moderate mitral valve  regurgitation. No evidence of mitral stenosis.   5. The aortic valve is normal in structure. Aortic valve regurgitation is  not visualized. Mild aortic valve stenosis. Aortic valve area, by VTI  measures 1.92 cm. Aortic valve mean gradient measures 8.0 mmHg.   6. The inferior vena cava is normal in size with greater than 50%  respiratory variability, suggesting right atrial pressure of 3 mmHg.   Patient Profile     66 y.o. female with history of HFpEF, paroxysmal atrial fibrillation, CKD 4, COPD presenting with shortness of breath with exertion, being seen for HFpEF and atrial fibrillation.  Assessment & Plan    HFpEF - IV lasix 40mg  daily - Net -6.4L - appears euvolemic on exam - Echo showed LVEF 50-55%, no WMA, G2DD, mildly dilated LA, moderate MR, mild AS - Not a candidate for MRA or SGLT2i with progressive CKD - OK to change to oral lasix 40mg  daily  Acute hypoxic respiratory failure - multifactorial given COPD exacerbation, possible PNA - she is off O2  Paroxysmal Afib - tele shows she is in and out of afib - CHADSVASC at least 4 - continue Cardizem 120mg  daily for rate control - Eliquis 2.5mg  daily  Elevated troponin - mildly elevated and down trending, not consistent with ACS - Not a good candidate for LHC or CTA with renal dysfunction - Unable to pursue Lexiscan MPI with known 2nd degree AV block - Echo showed preserved LVSF  HTN - BP good - continue Hydralazine and diltiazem   CKD stage IV - kidney function stable  For questions or updates, please contact [place] Please consult  [email or web address] for contact info under   Signed, Cadence Ninfa Meeker, PA-C  07/04/2022, 11:24 AM      Attending Note Patient seen and examined, agree with detailed note above,   Patient presentation and plan discussed on rounds.    EKG lab work, chest x-ray, echocardiogram reviewed independently by myself  Reports abdominal distention slowly improving Still not at her baseline, no leg swelling Continues to have frequent long episodes atrial fibrillation, on Eliquis, diltiazem 120 daily minus 6.4 L total  On examination : alert oriented,  JVD 10+, lungs mildly decreased breath sounds throughout, otherwise clear to auscultation bilaterally, heart sounds regular normal S1-S2 no murmurs appreciated, abdomen soft nontender no significant lower extremity edema.  Musculoskeletal exam with good range of motion, neurologic exam grossly nonfocal  Lab work reviewed sodium 139 potassium 5.2 Creatinine 2.52 BUN 59, WBC 9.1 hemoglobin 11.9  A/P: Acute on chronic diastolic CHF Likely exacerbated by paroxysmal atrial fibrillation, frequent long episodes this admission Being treated with IV Lasix twice daily for abdominal fullness Still does not feel she is at her baseline Would continue IV Lasix twice daily with plan for oral Lasix 40 daily at discharge  Paroxysmal atrial fibrillation Frequent episodes, dating back several years with close association with her CHF symptoms Tolerating diltiazem, not on beta-blocker secondary to underlying COPD, AV block -In effort to maintain normal sinus rhythm, will add amiodarone 400 twice daily load 7 days then down to 200 twice daily 2 weeks then down to 200 daily  COPD Long history of smoking On room air Symptoms improving with diuresis, rhythm control  Elevated troponin Supply/demand mismatch in the setting of hypoxia on arrival, pulmonary edema No plan for ischemic work-up at this time Echocardiogram with normal LV function  Chronic kidney disease stage  IV Creatinine 2.5, stable Close monitoring on IV Lasix  Greater than 50% was spent in counseling and coordination of care with patient Total encounter time 50 minutes or more   Signed: Esmond Plants  M.D., Ph.D. [facility]

## 2022-07-04 NOTE — Progress Notes (Signed)
Progress Note    DARCEL ZICK   RSW:[id number]  DOB: 24-Apr-1956  DOA: 06/30/2022     4 PCP: [name], MD  Initial CC: Shortness of breath  Hospital Course: [name] is a 66 year old female with history of hypertension, GERD, hypertension, COPD not requiring O2 supplementation at baseline, tobacco use, CKD stage IV, paroxysmal atrial fibrillation on Eliquis, depression, anxiety, who presented to the ER with SOB. Work-up was notable for revealing A-fib with RVR, CHF exacerbation.  Cardiology was consulted on admission.  Patient was started on diuresis.  There was also concern for possible underlying COPD exacerbation and she was initiated on antibiotics as well.  Interval History:  No events overnight.  Resting comfortably in bed when seen this morning.  Breathing remains improved as well. Denies wheezing or cough.  Remains in A-fib but seems to be rhythm unaware.  Assessment and Plan:  Paroxysmal A-fib ([place]) with RVR -Cardiology consulted, continue eliquis - continue diltiazem as per cardiology -Per cardiology, if DCCV needed, will need TEE first. -Amiodarone added 07/04/2022 per cardiology due to persistent A-fib  Acute on chronic dCHF -Pulmonary crackles on admission, pulmonary edema, DOE/SOB -Suspected volume overload on admission.  CXR showing right pleural effusion and pulmonary edema -Echo obtained on admission: EF 50 to 09%, grade 2 diastolic dysfunction, no RWMA. -Cardiology following, appreciate assistance - Continue diuresis as per cardiology - Continue trending BMP as well; renal function remaining stable  Acute hypoxic respiratory failure -Suspected due to underlying CHF exacerbation - Continue weaning O2 as able.  Not on oxygen at home - See CHF treatment  COPD - questionable exac on admission; now has completed azithro and rocephin x 4 days - stop abx and monitor off - continue breathing treatments as needed - VQ negative for PE - completed  steroid course    CKD (chronic kidney disease), stage IV ([place]) -Continue to monitor renal function.  Baseline creatinine 2-2.5 -Patient was placed on IV Lasix, held due to worsening renal function -Placed on sodium bicarb 650 mg twice daily    Hypertension -BP stable   HLD (hyperlipidemia) -Continue Lipitor   Tobacco abuse -Counseled on smoking cessation   GERD (gastroesophageal reflux disease) -Continue PPI   Depression -Continue Wellbutrin, Effexor  Old records reviewed in assessment of this patient  DVT prophylaxis:  apixaban (ELIQUIS) tablet 2.5 mg Start: 06/30/22 2200 Place TED hose Start: 06/30/22 1434 apixaban (ELIQUIS) tablet 2.5 mg   Code Status:   Code Status: Full Code  Mobility Assessment (last 72 hours)     Mobility Assessment     Row Name 07/03/22 0810 07/02/22 2200 07/02/22 0935       Does patient have an order for bedrest or is patient medically unstable No - Continue assessment No - Continue assessment No - Continue assessment     What is the highest level of mobility based on the progressive mobility assessment? Level 6 (Walks independently in room and hall) - Balance while walking in room without assist - Complete Level 6 (Walks independently in room and hall) - Balance while walking in room without assist - Complete Level 6 (Walks independently in room and hall) - Balance while walking in room without assist - Complete              Barriers to discharge:  Disposition Plan:  Home Status is: Inpt  Objective: Blood pressure 127/65, pulse 91, temperature 98.1 F (36.7 C), temperature source Oral, resp. rate 18, height 5\' 5"  (1.651 m),  weight 54.4 kg, SpO2 100 %.  Examination:  Physical Exam Constitutional:      Appearance: She is well-developed.  HENT:     Head: Normocephalic and atraumatic.     Mouth/Throat:     Mouth: Mucous membranes are moist.  Eyes:     Extraocular Movements: Extraocular movements intact.  Cardiovascular:     Rate  and Rhythm: Normal rate.  Pulmonary:     Effort: Pulmonary effort is normal.     Breath sounds: Normal breath sounds.  Abdominal:     General: Bowel sounds are normal. There is no distension.     Palpations: Abdomen is soft.     Tenderness: There is no abdominal tenderness.  Musculoskeletal:        General: Normal range of motion.     Cervical back: Normal range of motion and neck supple.  Skin:    General: Skin is warm.  Neurological:     General: No focal deficit present.     Mental Status: She is alert.  Psychiatric:        Mood and Affect: Mood normal.      Consultants:  Cardiology  Procedures:    Data Reviewed: Results for orders placed or performed during the hospital encounter of 06/30/22 (from the past 24 hour(s))  Basic metabolic panel     Status: Abnormal   Collection Time: 07/04/22  6:24 AM  Result Value Ref Range   Sodium 139 135 - 145 mmol/L   Potassium 5.2 (H) 3.5 - 5.1 mmol/L   Chloride 112 (H) 98 - 111 mmol/L   CO2 21 (L) 22 - 32 mmol/L   Glucose, Bld 101 (H) 70 - 99 mg/dL   BUN 59 (H) 8 - 23 mg/dL   Creatinine, Ser 2.52 (H) 0.44 - 1.00 mg/dL   Calcium 9.1 8.9 - 10.3 mg/dL   GFR, Estimated 20 (L) >60 mL/min   Anion gap 6 5 - 15  CBC with Differential/Platelet     Status: Abnormal   Collection Time: 07/04/22  6:24 AM  Result Value Ref Range   WBC 9.1 4.0 - 10.5 K/uL   RBC 4.12 3.87 - 5.11 MIL/uL   Hemoglobin 11.9 (L) 12.0 - 15.0 g/dL   HCT 36.5 36.0 - 46.0 %   MCV 88.6 80.0 - 100.0 fL   MCH 28.9 26.0 - 34.0 pg   MCHC 32.6 30.0 - 36.0 g/dL   RDW 16.8 (H) 11.5 - 15.5 %   Platelets 176 150 - 400 K/uL   nRBC 0.0 0.0 - 0.2 %   Neutrophils Relative % 78 %   Neutro Abs 7.0 1.7 - 7.7 K/uL   Lymphocytes Relative 14 %   Lymphs Abs 1.3 0.7 - 4.0 K/uL   Monocytes Relative 8 %   Monocytes Absolute 0.8 0.1 - 1.0 K/uL   Eosinophils Relative 0 %   Eosinophils Absolute 0.0 0.0 - 0.5 K/uL   Basophils Relative 0 %   Basophils Absolute 0.0 0.0 - 0.1 K/uL    Immature Granulocytes 0 %   Abs Immature Granulocytes 0.04 0.00 - 0.07 K/uL  Magnesium     Status: Abnormal   Collection Time: 07/04/22  6:24 AM  Result Value Ref Range   Magnesium 2.5 (H) 1.7 - 2.4 mg/dL  Potassium     Status: Abnormal   Collection Time: 07/04/22  1:33 PM  Result Value Ref Range   Potassium 5.5 (H) 3.5 - 5.1 mmol/L    I have Reviewed nursing notes,  Vitals, and Lab results since pt's last encounter. Pertinent lab results : see above I have ordered test including BMP, CBC, Mg I have reviewed the last note from staff over past 24 hours I have discussed pt's care plan and test results with nursing staff, case manager   LOS: 4 days   Dwyane Dee, MD [facility] 07/04/2022, 4:19 PM

## 2022-07-05 DIAGNOSIS — I5032 Chronic diastolic (congestive) heart failure: Secondary | ICD-10-CM | POA: Diagnosis not present

## 2022-07-05 DIAGNOSIS — I480 Paroxysmal atrial fibrillation: Secondary | ICD-10-CM | POA: Diagnosis not present

## 2022-07-05 DIAGNOSIS — J9601 Acute respiratory failure with hypoxia: Secondary | ICD-10-CM | POA: Diagnosis not present

## 2022-07-05 LAB — CBC WITH DIFFERENTIAL/PLATELET
Abs Immature Granulocytes: 0.02 10*3/uL (ref 0.00–0.07)
Basophils Absolute: 0.1 10*3/uL (ref 0.0–0.1)
Basophils Relative: 1 %
Eosinophils Absolute: 0.2 10*3/uL (ref 0.0–0.5)
Eosinophils Relative: 2 %
HCT: 36.8 % (ref 36.0–46.0)
Hemoglobin: 12 g/dL (ref 12.0–15.0)
Immature Granulocytes: 0 %
Lymphocytes Relative: 19 %
Lymphs Abs: 1.5 10*3/uL (ref 0.7–4.0)
MCH: 28.8 pg (ref 26.0–34.0)
MCHC: 32.6 g/dL (ref 30.0–36.0)
MCV: 88.2 fL (ref 80.0–100.0)
Monocytes Absolute: 0.8 10*3/uL (ref 0.1–1.0)
Monocytes Relative: 10 %
Neutro Abs: 5.5 10*3/uL (ref 1.7–7.7)
Neutrophils Relative %: 68 %
Platelets: 187 10*3/uL (ref 150–400)
RBC: 4.17 MIL/uL (ref 3.87–5.11)
RDW: 16.5 % — ABNORMAL HIGH (ref 11.5–15.5)
WBC: 8.1 10*3/uL (ref 4.0–10.5)
nRBC: 0 % (ref 0.0–0.2)

## 2022-07-05 LAB — BASIC METABOLIC PANEL
Anion gap: 6 (ref 5–15)
BUN: 64 mg/dL — ABNORMAL HIGH (ref 8–23)
CO2: 23 mmol/L (ref 22–32)
Calcium: 8.9 mg/dL (ref 8.9–10.3)
Chloride: 109 mmol/L (ref 98–111)
Creatinine, Ser: 2.48 mg/dL — ABNORMAL HIGH (ref 0.44–1.00)
GFR, Estimated: 21 mL/min — ABNORMAL LOW (ref >60)
Glucose, Bld: 105 mg/dL — ABNORMAL HIGH (ref 70–99)
Potassium: 5.1 mmol/L (ref 3.5–5.1)
Sodium: 138 mmol/L (ref 135–145)

## 2022-07-05 LAB — MAGNESIUM: Magnesium: 2.5 mg/dL — ABNORMAL HIGH (ref 1.7–2.4)

## 2022-07-05 MED ORDER — AMIODARONE HCL 200 MG PO TABS: ORAL_TABLET | ORAL | 3 refills | Status: DC

## 2022-07-05 MED ORDER — DILTIAZEM HCL ER COATED BEADS 120 MG PO CP24: 120.0000 mg | ORAL_CAPSULE | Freq: Every day | ORAL | 3 refills | Status: DC

## 2022-07-05 NOTE — TOC Transition Note (Signed)
Transition of Care [facility]) - CM/SW Discharge Note   Patient Details  Name: Dawn Sherman MRN: [id number] Date of Birth: 31-Aug-1955  Transition of Care [facility] CM/SW Contact:  Candie Chroman, LCSW Phone Number: 07/05/2022, 11:34 AM   Clinical Narrative:  Patient has orders to discharge home today. Patient has been weaned to room air. No further needs. CSW signing off.   Final next level of care: Home/Self Care Barriers to Discharge: Barriers Resolved   Patient Goals and CMS Choice        Discharge Placement                Patient to be transferred to facility by: Husband   Patient and family notified of of transfer: 07/05/22  Discharge Plan and Services     Post Acute Care Choice: NA                               Social Determinants of Health (SDOH) Interventions     Readmission Risk Interventions    07/02/2022   12:26 PM 12/11/2021    3:17 PM  Readmission Risk Prevention Plan  Transportation Screening Complete Complete  PCP or Specialist Appt within 3-5 Days  Complete  HRI or [place]  Complete  Social Work Consult for [place] Planning/Counseling  Complete  Palliative Care Screening  Not Applicable  Medication Review [employer] photographer) Complete Complete  PCP or Specialist appointment within 3-5 days of discharge Complete   SW Recovery Care/Counseling Consult Complete   [place] Not Applicable

## 2022-07-05 NOTE — Progress Notes (Signed)
Rounding Note    Patient Name: Dawn Sherman Date of Encounter: 07/05/2022  [facility] Cardiologist: Ida Rogue, MD   Subjective   UOP -1.3L. Kidney function is stable. Patient feels about the same. No chest pain. She remains in NSR.   Inpatient Medications    Scheduled Meds:  amiodarone  400 mg Oral BID   apixaban  2.5 mg Oral BID   atorvastatin  40 mg Oral QHS   buPROPion  150 mg Oral Daily   calcium carbonate  1 tablet Oral TID WC   Chlorhexidine Gluconate Cloth  6 each Topical Daily   diltiazem  120 mg Oral Daily   ferrous sulfate  325 mg Oral BID WC   fluticasone furoate-vilanterol  1 puff Inhalation Daily   furosemide  40 mg Oral Daily   hydrALAZINE  25 mg Oral TID   levalbuterol  0.63 mg Nebulization BID   And   ipratropium  0.5 mg Nebulization BID   multivitamin with minerals  1 tablet Oral Daily   pantoprazole  40 mg Oral Daily   polyethylene glycol  17 g Oral Daily   senna-docusate  1 tablet Oral BID   sodium bicarbonate  650 mg Oral BID   tamsulosin  0.4 mg Oral Daily   umeclidinium bromide  1 puff Inhalation Daily   venlafaxine XR  150 mg Oral Q breakfast   Continuous Infusions:  PRN Meds: acetaminophen **OR** acetaminophen, albuterol, lactulose, metoprolol tartrate, nicotine, ondansetron **OR** ondansetron (ZOFRAN) IV   Vital Signs    Vitals:   07/04/22 2347 07/05/22 0341 07/05/22 0719 07/05/22 0754  BP: (!) 128/57 132/69  (!) 142/75  Pulse: 82 84  86  Resp: 18 18    Temp: 97.7 F (36.5 C)     TempSrc: Oral     SpO2: 92% 90% 90% 91%  Weight:      Height:        Intake/Output Summary (Last 24 hours) at 07/05/2022 0758 Last data filed at 07/04/2022 1700 Gross per 24 hour  Intake 480 ml  Output 1800 ml  Net -1320 ml      06/30/2022   12:14 PM 06/08/2022   10:46 AM 05/06/2022    9:01 AM  Last 3 Weights  Weight (lbs) 120 lb 130 lb 122 lb 6.4 oz  Weight (kg) 54.432 kg 58.968 kg 55.52 kg      Telemetry    NSR HR 80s -  Personally Reviewed  ECG    No new - Personally Reviewed  Physical Exam   GEN: No acute distress.   Neck: No JVD Cardiac: RRR, no murmurs, rubs, or gallops.  Respiratory: diffusely diminished GI: Soft, nontender, non-distended  MS: No edema; No deformity. Neuro:  Nonfocal  Psych: Normal affect   Labs    High Sensitivity Troponin:   Recent Labs  Lab 06/30/22 1227 06/30/22 1514  TROPONINIHS 59* 55*     Chemistry Recent Labs  Lab 06/30/22 1227 07/01/22 0515 07/03/22 0544 07/04/22 0624 07/04/22 1333 07/05/22 0538  NA 139   < > 139 139  --  138  K 4.8   < > 4.5 5.2* 5.5* 5.1  CL 110   < > 111 112*  --  109  CO2 21*   < > 23 21*  --  23  GLUCOSE 85   < > 101* 101*  --  105*  BUN 38*   < > 57* 59*  --  64*  CREATININE 2.17*   < >  2.57* 2.52*  --  2.48*  CALCIUM 8.9   < > 9.2 9.1  --  8.9  MG  --   --   --  2.5*  --  2.5*  PROT 7.2  --   --   --   --   --   ALBUMIN 4.1  --   --   --   --   --   AST 34  --   --   --   --   --   ALT 31  --   --   --   --   --   ALKPHOS 112  --   --   --   --   --   BILITOT 1.0  --   --   --   --   --   GFRNONAA 24*   < > 20* 20*  --  21*  ANIONGAP 8   < > 5 6  --  6   < > = values in this interval not displayed.    Lipids No results for input(s): "CHOL", "TRIG", "HDL", "LABVLDL", "LDLCALC", "CHOLHDL" in the last 168 hours.  Hematology Recent Labs  Lab 07/01/22 0515 07/04/22 0624 07/05/22 0538  WBC 5.6 9.1 8.1  RBC 4.23 4.12 4.17  HGB 12.0 11.9* 12.0  HCT 37.8 36.5 36.8  MCV 89.4 88.6 88.2  MCH 28.4 28.9 28.8  MCHC 31.7 32.6 32.6  RDW 17.0* 16.8* 16.5*  PLT 160 176 187   Thyroid No results for input(s): "TSH", "FREET4" in the last 168 hours.  BNP Recent Labs  Lab 06/30/22 1227  BNP 1,518.4*    DDimer  Recent Labs  Lab 06/30/22 1227 07/01/22 0653  DDIMER 1.64* 1.06*     Radiology    No results found.  Cardiac Studies   TTE 07/01/2022 1. Left ventricular ejection fraction, by estimation, is 50 to 55%.  The  left ventricle has low normal function. The left ventricle has no regional  wall motion abnormalities. The left ventricular internal cavity size was  mildly dilated. Left ventricular  diastolic parameters are consistent with Grade II diastolic dysfunction  (pseudonormalization).   2. Right ventricular systolic function is normal. The right ventricular  size is normal.   3. Left atrial size was mildly dilated.   4. The mitral valve is normal in structure. Moderate mitral valve  regurgitation. No evidence of mitral stenosis.   5. The aortic valve is normal in structure. Aortic valve regurgitation is  not visualized. Mild aortic valve stenosis. Aortic valve area, by VTI  measures 1.92 cm. Aortic valve mean gradient measures 8.0 mmHg.   6. The inferior vena cava is normal in size with greater than 50%  respiratory variability, suggesting right atrial pressure of 3 mmHg.   Patient Profile     66 y.o. female with a history of HFpEF, paroxysmal atrial fibrillation, CKD 4, COPD presenting with shortness of breath with exertion, being seen for HFpEF and atrial fibrillation.   Assessment & Plan    HFpEF - IV lasix 40mg  daily - Net -7.7L - appears euvolemic on exam - Echo showed LVEF 50-55%, no WMA, G2DD, mildly dilated LA, moderate MR, mild AS - Not a candidate for MRA or SGLT2i with progressive CKD - continue with diuresis for now - OK to change to oral lasix 40mg  daily at discharge   Acute hypoxic respiratory failure - multifactorial given COPD exacerbation, possible PNA - she is off O2   Paroxysmal Afib -  tele shows she is in and out of afib - started on amiodarone 400mg  BIDx7 days>200mg  BID x 7 days>200mg  thereafter - CHADSVASC at least 4 - continue Cardizem 120mg  daily for rate control - continue Eliquis 2.5mg  daily   Elevated troponin - mildly elevated and down trending, not consistent with ACS - Not a good candidate for LHC or CTA with renal dysfunction - Unable to  pursue Lexiscan MPI with known 2nd degree AV block - Echo showed preserved LVSF   HTN - BP good - continue Hydralazine and diltiazem    CKD stage IV - kidney function stable  For questions or updates, please contact [facility] Please consult [email or web address] for contact info under        Signed, Analie Katzman Ninfa Meeker, PA-C  07/05/2022, 7:58 AM

## 2022-07-05 NOTE — Care Management Important Message (Signed)
Important Message  Patient Details  Name: ZYONNA VARDAMAN MRN: [id number] Date of Birth: 24-Aug-1956   Medicare Important Message Given:  Yes     [name] 07/05/2022, 10:55 AM

## 2022-07-05 NOTE — Discharge Summary (Signed)
Physician Discharge Summary   [name] QHU:[id number] DOB: August 26, 1956 DOA: 06/30/2022  PCP: [name], MD  Admit date: 06/30/2022 Discharge date: 07/05/2022  Barriers to discharge: none  Admitted From: Home Disposition:  Home Discharging physician: Dwyane Dee, MD  Recommendations for Outpatient Follow-up:  Follow up with cardiology  Home Health:  Equipment/Devices:   Discharge Condition: stable CODE STATUS: Full Diet recommendation:  Diet Orders (From admission, onward)     Start     Ordered   07/05/22 0000  Diet - low sodium heart healthy        07/05/22 1127   07/01/22 1019  Diet 2 gram sodium Room service appropriate? Yes; Fluid consistency: Thin  Diet effective now       Question Answer Comment  Room service appropriate? Yes   Fluid consistency: Thin      07/01/22 1018            Hospital Course: Dawn Sherman is a 66 year old female with history of hypertension, GERD, hypertension, COPD not requiring O2 supplementation at baseline, tobacco use, CKD stage IV, paroxysmal atrial fibrillation on Eliquis, depression, anxiety, who presented to the ER with SOB. Work-up was notable for revealing A-fib with RVR, CHF exacerbation.  Cardiology was consulted on admission.  Patient was started on diuresis.  There was also concern for possible underlying COPD exacerbation and she was initiated on antibiotics as well.  Assessment and Plan:  Paroxysmal A-fib ([place]) with RVR -Cardiology consulted, continue eliquis - continue diltiazem as per cardiology -Per cardiology, if DCCV needed, will need TEE first. -Amiodarone added 07/04/2022 per cardiology due to persistent A-fib; discharged with tapering in place as per instructions    Acute on chronic dCHF -Pulmonary crackles on admission, pulmonary edema, DOE/SOB -Suspected volume overload on admission.  CXR showing right pleural effusion and pulmonary edema -Echo obtained on admission: EF 50 to 55%, grade 2  diastolic dysfunction, no RWMA. -Cardiology following, appreciate assistance -Renal function stabilized -Diuresed well, resumed back on oral Lasix at discharge   Acute hypoxic respiratory failure -resolved -Suspected due to underlying CHF exacerbation - Continue weaning O2 as able.  Not on oxygen at home -Weaned to room air prior to discharge   COPD - questionable exac on admission; now has completed azithro and rocephin x 4 days - stop abx and monitor off - continue breathing treatments as needed - VQ negative for PE - completed steroid course    CKD (chronic kidney disease), stage IV (HCC) -Continue to monitor renal function.  Baseline creatinine 2-2.5 -Patient was placed on IV Lasix, held due to worsening renal function -Placed on sodium bicarb 650 mg twice daily    Hypertension -BP stable   HLD (hyperlipidemia) -Continue Lipitor   Tobacco abuse -Counseled on smoking cessation   GERD (gastroesophageal reflux disease) -Continue PPI   Depression -Continue Wellbutrin, Effexor   The patient's chronic medical conditions were treated accordingly per the patient's home medication regimen except as noted.  On day of discharge, patient was felt deemed stable for discharge. Patient/family member advised to call PCP or come back to ER if needed.   Principal Diagnosis: Acute hypoxemic respiratory failure [facility])  Discharge Diagnoses: Active Hospital Problems   Diagnosis Date Noted   Acute hypoxemic respiratory failure ([place]) 06/30/2022   Tachycardia 06/30/2022    Priority: High   Chronic heart failure with preserved ejection fraction ([place]) 03/26/2022    Priority: High   Chronic obstructive pulmonary disease ([place]) 11/22/2020    Priority: High  Persistent atrial fibrillation ([place]) 12/10/2021    Priority: 3.   Paroxysmal atrial fibrillation ([place]) 08/12/2020    Priority: 3.   CKD (chronic kidney disease), stage IV ([place]) 03/26/2022    Priority: 4.   Hypertension 07/05/2012     Priority: 5.   HLD (hyperlipidemia) 02/10/2020    Priority: 7.   Tobacco abuse 12/02/2014    Priority: 8.   GERD (gastroesophageal reflux disease) 02/10/2020    Priority: 9.   Acute on chronic congestive heart failure ([place]) 07/04/2022   Dyslipidemia 12/17/2021   Depression, major, single episode, complete remission ([place]) 12/17/2021   Elevated troponin 02/10/2020   Acute respiratory failure with hypoxia ([place]) 02/10/2020    Resolved Hospital Problems  No resolved problems to display.     Discharge Instructions     Diet - low sodium heart healthy   Complete by: As directed    Increase activity slowly   Complete by: As directed       Allergies as of 07/05/2022       Reactions   Sulfate Rash   Codeine Sulfate Nausea Only   Benicar [olmesartan]    Talked with patient February 10, 2020, intolerance is unclear, tried several medications around that time and one of them gave her a rash but she is not clear which 1.   Amoxicillin Rash   Clindamycin/lincomycin Rash   Morphine And Related Rash   Penicillins Rash        Medication List     STOP taking these medications    amLODipine 10 MG tablet Commonly known as: NORVASC       TAKE these medications    acetaminophen 325 MG tablet Commonly known as: TYLENOL Take 650 mg by mouth every 6 (six) hours as needed.   albuterol 108 (90 Base) MCG/ACT inhaler Commonly known as: VENTOLIN HFA Inhale 2 puffs into the lungs every 6 (six) hours as needed for wheezing or shortness of breath.   amiodarone 200 MG tablet Commonly known as: PACERONE Take 2 tablets twice a day for 6 more days then 1 tablet twice a day for 7 days then 1 tablet daily   apixaban 2.5 MG Tabs tablet Commonly known as: ELIQUIS Take 1 tablet (2.5 mg total) by mouth 2 (two) times daily.   atorvastatin 40 MG tablet Commonly known as: LIPITOR TAKE ONE TABLET BY MOUTH EVERY DAY   buPROPion 150 MG 24 hr tablet Commonly known as: WELLBUTRIN XL TAKE ONE  TABLET BY MOUTH EVERY DAY   calcium carbonate 1250 (500 Ca) MG tablet Commonly known as: OS-CAL - dosed in mg of elemental calcium Take 1 tablet (500 mg of elemental calcium total) by mouth 3 (three) times daily with meals.   diltiazem 120 MG 24 hr capsule Commonly known as: CARDIZEM CD Take 1 capsule (120 mg total) by mouth daily. Start taking on: July 06, 2022   Farxiga 10 MG Tabs tablet Generic drug: dapagliflozin propanediol Take 10 mg by mouth daily.   ferrous sulfate 325 (65 FE) MG tablet Take 1 tablet (325 mg total) by mouth 2 (two) times daily with a meal.   furosemide 40 MG tablet Commonly known as: LASIX Take 1 tablet (40 mg total) by mouth daily. Take 40 mg daily and and extra 40 mg as needed for fluid retention   hydrALAZINE 25 MG tablet Commonly known as: APRESOLINE Take 1 tablet (25 mg total) by mouth 3 (three) times daily.   loratadine 10 MG tablet Commonly known as: CLARITIN  Take 10 mg by mouth daily.   omeprazole 20 MG capsule Commonly known as: PRILOSEC Take 20 mg by mouth daily.   Trelegy Ellipta 100-62.5-25 MCG/ACT Aepb Generic drug: Fluticasone-Umeclidin-Vilant Inhale 1 puff into the lungs daily.   venlafaxine XR 150 MG 24 hr capsule Commonly known as: EFFEXOR-XR TAKE ONE CAPSULE BY MOUTH EVERY DAY        Allergies  Allergen Reactions   Sulfate Rash   Codeine Sulfate Nausea Only   Benicar [Olmesartan]     Talked with patient February 10, 2020, intolerance is unclear, tried several medications around that time and one of them gave her a rash but she is not clear which 1.   Amoxicillin Rash   Clindamycin/Lincomycin Rash   Morphine And Related Rash   Penicillins Rash    Consultations: Cardiology  Procedures:   Discharge Exam: BP (!) 110/59 (BP Location: Right Arm)   Pulse 84   Temp 97.9 F (36.6 C)   Resp 17   Ht 5\' 5"  (1.651 m)   Wt 54.4 kg   SpO2 95%   BMI 19.97 kg/m  Physical Exam Constitutional:      Appearance: She is  well-developed.  HENT:     Head: Normocephalic and atraumatic.     Mouth/Throat:     Mouth: Mucous membranes are moist.  Eyes:     Extraocular Movements: Extraocular movements intact.  Cardiovascular:     Rate and Rhythm: Normal rate.  Pulmonary:     Effort: Pulmonary effort is normal.     Breath sounds: Normal breath sounds.  Abdominal:     General: Bowel sounds are normal. There is no distension.     Palpations: Abdomen is soft.     Tenderness: There is no abdominal tenderness.  Musculoskeletal:        General: Normal range of motion.     Cervical back: Normal range of motion and neck supple.  Skin:    General: Skin is warm.  Neurological:     General: No focal deficit present.     Mental Status: She is alert.  Psychiatric:        Mood and Affect: Mood normal.      The results of significant diagnostics from this hospitalization (including imaging, microbiology, ancillary and laboratory) are listed below for reference.   Microbiology: Recent Results (from the past 240 hour(s))  SARS Coronavirus 2 by RT PCR (hospital order, performed in [facility] hospital lab) *cepheid single result test* Anterior Nasal Swab     Status: None   Collection Time: 06/30/22  3:14 PM   Specimen: Anterior Nasal Swab  Result Value Ref Range Status   SARS Coronavirus 2 by RT PCR NEGATIVE NEGATIVE Final    Comment: (NOTE) SARS-CoV-2 target nucleic acids are NOT DETECTED.  The SARS-CoV-2 RNA is generally detectable in upper and lower respiratory specimens during the acute phase of infection. The lowest concentration of SARS-CoV-2 viral copies this assay can detect is 250 copies / mL. A negative result does not preclude SARS-CoV-2 infection and should not be used as the sole basis for treatment or other patient management decisions.  A negative result may occur with improper specimen collection / handling, submission of specimen other than nasopharyngeal swab, presence of viral mutation(s)  within the areas targeted by this assay, and inadequate number of viral copies (<250 copies / mL). A negative result must be combined with clinical observations, patient history, and epidemiological information.  Fact Sheet for Patients:   https://www.patel.info/  Fact Sheet for Healthcare Providers: https://hall.com/  This test is not yet approved or  cleared by the [place] FDA and has been authorized for detection and/or diagnosis of SARS-CoV-2 by FDA under an Emergency Use Authorization (EUA).  This EUA will remain in effect (meaning this test can be used) for the duration of the COVID-19 declaration under Section 564(b)(1) of the Act, 21 U.S.C. section 360bbb-3(b)(1), unless the authorization is terminated or revoked sooner.  Performed at [facility], [address] [place], [place] [address]      Labs: BNP (last 3 results) Recent Labs    03/25/22 1802 06/08/22 1049 06/30/22 1227  BNP 1,712.2* 909.5* 8,676.7*   Basic Metabolic Panel: Recent Labs  Lab 07/01/22 0515 07/02/22 0006 07/03/22 0544 07/04/22 0624 07/04/22 1333 07/05/22 0538  NA 141 137 139 139  --  138  K 5.1 4.6 4.5 5.2* 5.5* 5.1  CL 114* 110 111 112*  --  109  CO2 20* 22 23 21*  --  23  GLUCOSE 114* 111* 101* 101*  --  105*  BUN 41* 52* 57* 59*  --  64*  CREATININE 2.53* 2.78* 2.57* 2.52*  --  2.48*  CALCIUM 8.9 8.5* 9.2 9.1  --  8.9  MG  --   --   --  2.5*  --  2.5*   Liver Function Tests: Recent Labs  Lab 06/30/22 1227  AST 34  ALT 31  ALKPHOS 112  BILITOT 1.0  PROT 7.2  ALBUMIN 4.1   No results for input(s): "LIPASE", "AMYLASE" in the last 168 hours. No results for input(s): "AMMONIA" in the last 168 hours. CBC: Recent Labs  Lab 06/30/22 1227 07/01/22 0515 07/04/22 0624 07/05/22 0538  WBC 8.5 5.6 9.1 8.1  NEUTROABS 6.5  --  7.0 5.5  HGB 12.3 12.0 11.9* 12.0  HCT 38.7 37.8 36.5 36.8  MCV 89.2 89.4 88.6 88.2  PLT  188 160 176 187   Cardiac Enzymes: No results for input(s): "CKTOTAL", "CKMB", "CKMBINDEX", "TROPONINI" in the last 168 hours. BNP: Invalid input(s): "POCBNP" CBG: No results for input(s): "GLUCAP" in the last 168 hours. D-Dimer No results for input(s): "DDIMER" in the last 72 hours. Hgb A1c No results for input(s): "HGBA1C" in the last 72 hours. Lipid Profile No results for input(s): "CHOL", "HDL", "LDLCALC", "TRIG", "CHOLHDL", "LDLDIRECT" in the last 72 hours. Thyroid function studies No results for input(s): "TSH", "T4TOTAL", "T3FREE", "THYROIDAB" in the last 72 hours.  Invalid input(s): "FREET3" Anemia work up No results for input(s): "VITAMINB12", "FOLATE", "FERRITIN", "TIBC", "IRON", "RETICCTPCT" in the last 72 hours. Urinalysis    Component Value Date/Time   COLORURINE YELLOW (A) 03/25/2022 2210   APPEARANCEUR CLEAR (A) 03/25/2022 2210   LABSPEC 1.016 03/25/2022 2210   PHURINE 5.0 03/25/2022 2210   GLUCOSEU NEGATIVE 03/25/2022 2210   GLUCOSEU NEGATIVE 01/22/2017 1052   HGBUR NEGATIVE 03/25/2022 2210   BILIRUBINUR NEGATIVE 03/25/2022 2210   KETONESUR NEGATIVE 03/25/2022 2210   PROTEINUR >=300 (A) 03/25/2022 2210   UROBILINOGEN 0.2 01/22/2017 1052   NITRITE NEGATIVE 03/25/2022 2210   LEUKOCYTESUR NEGATIVE 03/25/2022 2210   Sepsis Labs Recent Labs  Lab 06/30/22 1227 07/01/22 0515 07/04/22 0624 07/05/22 0538  WBC 8.5 5.6 9.1 8.1   Microbiology Recent Results (from the past 240 hour(s))  SARS Coronavirus 2 by RT PCR (hospital order, performed in [facility] hospital lab) *cepheid single result test* Anterior Nasal Swab     Status: None   Collection Time: 06/30/22  3:14  PM   Specimen: Anterior Nasal Swab  Result Value Ref Range Status   SARS Coronavirus 2 by RT PCR NEGATIVE NEGATIVE Final    Comment: (NOTE) SARS-CoV-2 target nucleic acids are NOT DETECTED.  The SARS-CoV-2 RNA is generally detectable in upper and lower respiratory specimens during the acute  phase of infection. The lowest concentration of SARS-CoV-2 viral copies this assay can detect is 250 copies / mL. A negative result does not preclude SARS-CoV-2 infection and should not be used as the sole basis for treatment or other patient management decisions.  A negative result may occur with improper specimen collection / handling, submission of specimen other than nasopharyngeal swab, presence of viral mutation(s) within the areas targeted by this assay, and inadequate number of viral copies (<250 copies / mL). A negative result must be combined with clinical observations, patient history, and epidemiological information.  Fact Sheet for Patients:   https://www.patel.info/  Fact Sheet for Healthcare Providers: https://hall.com/  This test is not yet approved or  cleared by the [place] FDA and has been authorized for detection and/or diagnosis of SARS-CoV-2 by FDA under an Emergency Use Authorization (EUA).  This EUA will remain in effect (meaning this test can be used) for the duration of the COVID-19 declaration under Section 564(b)(1) of the Act, 21 U.S.C. section 360bbb-3(b)(1), unless the authorization is terminated or revoked sooner.  Performed at [facility], [address] [place], [place] [address]     Procedures/Studies: ECHOCARDIOGRAM COMPLETE  Result Date: 07/01/2022    ECHOCARDIOGRAM REPORT   Patient Name:   Dawn Sherman Date of Exam: 07/01/2022 Medical Rec #:  [id number]     Height:       65.0 in Accession #:    [id number]    Weight:       120.0 lb Date of Birth:  [birth year]    BSA:          1.592 m Patient Age:    41 years      BP:           137/66 mmHg Patient Gender: F             HR:           99 bpm. Exam Location:  [facility] Procedure: 2D Echo, Color Doppler, Cardiac Doppler and Strain Analysis Indications:     R06.00 Dyspnea  History:         Patient has prior history of Echocardiogram examinations,  most                  recent 12/11/2021. CHF, CKD and COPD; Risk Factors:Hypertension,                  Dyslipidemia and Current Smoker.  Sonographer:     Charmayne Sheer Referring Phys:  [id number] [place] Diagnosing Phys: Kathlyn Sacramento MD  Sonographer Comments: Global longitudinal strain was attempted. IMPRESSIONS  1. Left ventricular ejection fraction, by estimation, is 50 to 55%. The left ventricle has low normal function. The left ventricle has no regional wall motion abnormalities. The left ventricular internal cavity size was mildly dilated. Left ventricular diastolic parameters are consistent with Grade II diastolic dysfunction (pseudonormalization).  2. Right ventricular systolic function is normal. The right ventricular size is normal.  3. Left atrial size was mildly dilated.  4. The mitral valve is normal in structure. Moderate mitral valve regurgitation. No evidence of mitral stenosis.  5. The aortic valve is normal in structure.  Aortic valve regurgitation is not visualized. Mild aortic valve stenosis. Aortic valve area, by VTI measures 1.92 cm. Aortic valve mean gradient measures 8.0 mmHg.  6. The inferior vena cava is normal in size with greater than 50% respiratory variability, suggesting right atrial pressure of 3 mmHg. FINDINGS  Left Ventricle: Left ventricular ejection fraction, by estimation, is 50 to 55%. The left ventricle has low normal function. The left ventricle has no regional wall motion abnormalities. Global longitudinal strain performed but not reported based on interpreter judgement due to suboptimal tracking. The left ventricular internal cavity size was mildly dilated. There is no left ventricular hypertrophy. Left ventricular diastolic parameters are consistent with Grade II diastolic dysfunction (pseudonormalization). Right Ventricle: The right ventricular size is normal. No increase in right ventricular wall thickness. Right ventricular systolic function is normal. Left Atrium: Left  atrial size was mildly dilated. Right Atrium: Right atrial size was normal in size. Pericardium: There is no evidence of pericardial effusion. Mitral Valve: The mitral valve is normal in structure. There is moderate thickening of the mitral valve leaflet(s). There is mild calcification of the mitral valve leaflet(s). Moderate mitral valve regurgitation. No evidence of mitral valve stenosis. Tricuspid Valve: The tricuspid valve is normal in structure. Tricuspid valve regurgitation is not demonstrated. No evidence of tricuspid stenosis. Aortic Valve: The aortic valve is normal in structure. Aortic valve regurgitation is not visualized. Mild aortic stenosis is present. Aortic valve mean gradient measures 8.0 mmHg. Aortic valve peak gradient measures 12.8 mmHg. Aortic valve area, by VTI measures 1.92 cm. Pulmonic Valve: The pulmonic valve was normal in structure. Pulmonic valve regurgitation is not visualized. No evidence of pulmonic stenosis. Aorta: The aortic root is normal in size and structure. Venous: The inferior vena cava is normal in size with greater than 50% respiratory variability, suggesting right atrial pressure of 3 mmHg. IAS/Shunts: No atrial level shunt detected by color flow Doppler.  LEFT VENTRICLE PLAX 2D LVIDd:         5.20 cm   Diastology LVIDs:         4.00 cm   LV e' medial:    7.18 cm/s LV PW:         1.10 cm   LV E/e' medial:  21.4 LV IVS:        0.70 cm   LV e' lateral:   12.80 cm/s LVOT diam:     1.80 cm   LV E/e' lateral: 12.0 LV SV:         63 LV SV Index:   40 LVOT Area:     2.54 cm  RIGHT VENTRICLE RV Basal diam:  3.70 cm RV S prime:     13.20 cm/s LEFT ATRIUM             Index        RIGHT ATRIUM           Index LA diam:        5.10 cm 3.20 cm/m   RA Area:     15.40 cm LA Vol (A2C):   62.6 ml 39.32 ml/m  RA Volume:   39.30 ml  24.68 ml/m LA Vol (A4C):   52.4 ml 32.91 ml/m LA Biplane Vol: 58.6 ml 36.81 ml/m  AORTIC VALVE                     PULMONIC VALVE AV Area (Vmax):    1.82  cm      PV Vmax:  1.44 m/s AV Area (Vmean):   1.65 cm      PV Vmean:      102.000 cm/s AV Area (VTI):     1.92 cm      PV VTI:        0.258 m AV Vmax:           179.00 cm/s   PV Peak grad:  8.3 mmHg AV Vmean:          136.000 cm/s  PV Mean grad:  5.0 mmHg AV VTI:            0.329 m AV Peak Grad:      12.8 mmHg AV Mean Grad:      8.0 mmHg LVOT Vmax:         128.00 cm/s LVOT Vmean:        88.300 cm/s LVOT VTI:          0.248 m LVOT/AV VTI ratio: 0.75  AORTA Ao Root diam: 3.10 cm MITRAL VALVE MV Area (PHT): 6.32 cm     SHUNTS MV Decel Time: 120 msec     Systemic VTI:  0.25 m MV E velocity: 154.00 cm/s  Systemic Diam: 1.80 cm MV A velocity: 143.00 cm/s MV E/A ratio:  1.08 Kathlyn Sacramento MD Electronically signed by Kathlyn Sacramento MD Signature Date/Time: 07/01/2022/3:03:52 PM    Final    NM Pulmonary Perfusion  Result Date: 07/01/2022 CLINICAL DATA:  PE suspected, positive D-dimer EXAM: NUCLEAR MEDICINE PERFUSION LUNG SCAN TECHNIQUE: Perfusion images were obtained in multiple projections after intravenous injection of radiopharmaceutical. Ventilation scans intentionally deferred if perfusion scan and chest x-ray adequate for interpretation during COVID 19 epidemic. RADIOPHARMACEUTICALS:  4.43 mCi Tc-56m MAA IV COMPARISON:  Chest radiograph, 06/30/2022 FINDINGS: Normal, homogeneous perfusion of the lungs. No suspicious perfusion defect. Cardiomegaly. IMPRESSION: 1. Very low probability for pulmonary embolism by modified perfusion only PIOPED criteria (PE absent). 2.  Cardiomegaly. Electronically Signed   By: Delanna Ahmadi M.D.   On: 07/01/2022 10:50   DG Chest Port 1 View  Result Date: 06/30/2022 CLINICAL DATA:  Shortness of breath. EXAM: PORTABLE CHEST 1 VIEW COMPARISON:  June 30, 2022 FINDINGS: The cardiac silhouette is mildly enlarged and unchanged in size. Predominant stable, mild to moderate severity diffusely increased interstitial lung markings are seen. Moderate severity right basilar atelectasis  and/or infiltrate is also noted. Mild prominence of the pulmonary vasculature is seen. There is no evidence of a pleural effusion or pneumothorax. There are multiple chronic left-sided rib fractures with marked severity dextroscoliosis of the lower thoracic spine. IMPRESSION: 1. Stable cardiomegaly with moderate severity right basilar atelectasis and/or infiltrate. 2. Chronic interstitial lung disease with mild pulmonary vascular congestion. A superimposed component of mild interstitial edema cannot be excluded. Electronically Signed   By: Virgina Norfolk M.D.   On: 06/30/2022 20:05   DG Chest 2 View  Result Date: 06/30/2022 CLINICAL DATA:  Shortness of breath EXAM: CHEST - 2 VIEW COMPARISON:  06/08/2022 FINDINGS: Mild cardiomegaly, stable. Mild vascular congestion. Increasing interstitial opacities at the right lung base. Trace right pleural effusion. No pneumothorax. Scoliotic spinal curvature. Remote left-sided rib fractures. IMPRESSION: Increasing interstitial opacities at the right lung base with trace right pleural effusion. Findings may be related to asymmetric edema or atypical/viral infection. Electronically Signed   By: Davina Poke D.O.   On: 06/30/2022 13:16   MM 3D SCREEN BREAST BILATERAL  Result Date: 06/18/2022 CLINICAL DATA:  Screening. EXAM: DIGITAL SCREENING BILATERAL MAMMOGRAM WITH TOMOSYNTHESIS AND  CAD TECHNIQUE: Bilateral screening digital craniocaudal and mediolateral oblique mammograms were obtained. Bilateral screening digital breast tomosynthesis was performed. The images were evaluated with computer-aided detection. COMPARISON:  Previous exam(s). ACR Breast Density Category d: The breast tissue is extremely dense, which lowers the sensitivity of mammography FINDINGS: There are no findings suspicious for malignancy. IMPRESSION: No mammographic evidence of malignancy. A result letter of this screening mammogram will be mailed directly to the patient. RECOMMENDATION: Screening  mammogram in one year. (Code:[id number]-B-01Y) BI-RADS CATEGORY  1: Negative. Electronically Signed   By: Franki Cabot M.D.   On: 06/18/2022 16:15   DEXAScan  Result Date: 06/17/2022 EXAM: DUAL X-RAY ABSORPTIOMETRY (DXA) FOR BONE MINERAL DENSITY IMPRESSION: Your patient Joceline Hinchcliff completed a BMD test on 06/17/2022 using the Orient (software version: 14.10) manufactured by [employer]. The following summarizes the results of our evaluation. Technologist::TNB PATIENT BIOGRAPHICAL: Name: Ryna, Beckstrom I Patient ID: [id number] Birth Date: 1955/12/28 Height: 65.0 in. Gender: Female Exam Date: 06/17/2022 Weight: 130.0 lbs. Indications: Caucasian, Hysterectomy, Oophorectomy Unilateral, Postmenopausal, Tobacco User Fractures: Ankle Right Treatments: calcium w/ vit D DENSITOMETRY RESULTS: Site         Region     Measured Date Measured Age WHO Classification Young Adult T-score BMD         %Change vs. Previous Significant Change (*) DualFemur Neck Right 5/17/[age].0 Osteoporosis -3.3 0.585 g/cm2 - - Left Forearm Radius 33% 06/17/2022 66.0 Osteoporosis -2.7 0.638 g/cm2 - - ASSESSMENT: The BMD measured at Femur Neck Right is 0.585 g/cm2 with a T-score of -3.3. This patient is considered osteoporotic according to [facility]) criteria. Lumbar spine was not utilized due to advanced degenerative changes. Patient is not a candidate for FRAX due to diagnosis of osteoporosis. The scan quality is good. [facility]) criteria for post-menopausal, Caucasian Women: Normal:                   T-score at or above -1 SD Osteopenia/low bone mass: T-score between -1 and -2.5 SD Osteoporosis:             T-score at or below -2.5 SD RECOMMENDATIONS: 1. All patients should optimize calcium and vitamin D intake. 2. Consider FDA-approved medical therapies in postmenopausal women and men aged 47 years and older, based on the following: a. A hip or vertebral(clinical or morphometric)  fracture b. T-score < -2.5 at the femoral neck or spine after appropriate evaluation to exclude secondary causes c. Low bone mass (T-score between -1.0 and -2.5 at the femoral neck or spine) and a 10-year probability of a hip fracture > 3% or a 10-year probability of a major osteoporosis-related fracture > 20% based on the [place]-adapted WHO algorithm 3. Clinician judgment and/or patient preferences may indicate treatment for people with 10-year fracture probabilities above or below these levels FOLLOW-UP: People with diagnosed cases of osteoporosis or at high risk for fracture should have regular bone mineral density tests. For patients eligible for Medicare, routine testing is allowed once every 2 years. The testing frequency can be increased to one year for patients who have rapidly progressing disease, those who are receiving or discontinuing medical therapy to restore bone mass, or have additional risk factors. I have reviewed this report, and agree with the above findings. [facility] Electronically Signed   By: Zerita Boers M.D.   On: 06/17/2022 09:43   DG Chest 2 View  Result Date: 06/08/2022 CLINICAL DATA:  Patient developed a  nose bleed this morning while making breakfast. History of high blood pressure. EXAM: CHEST - 2 VIEW COMPARISON:  03/25/2022.  CT, 02/10/2020. FINDINGS: Cardiac silhouette is mildly enlarged. No mediastinal or hilar masses. No evidence of adenopathy. Lungs are hyperexpanded. Prominent vascular and interstitial markings, stable. Lungs otherwise clear. Pleural thickening underlying old left-sided rib fractures. No pleural effusion or pneumothorax. No acute skeletal abnormality. IMPRESSION: No acute cardiopulmonary disease. Electronically Signed   By: Lajean Manes M.D.   On: 06/08/2022 12:17     Time coordinating discharge: Over 30 minutes    Dwyane Dee, MD  [facility] 07/05/2022, 3:38 PM

## 2022-07-07 NOTE — Progress Notes (Deleted)
Patient ID: Dawn Sherman, female    DOB: 1956/06/01, 66 y.o.   MRN: [id number]   Dawn Sherman is a 66 y/o female with a history of HTN, CKD, hyperlipidemia, depression, current tobacco use and chronic heart failure.   Echo report from 07/01/22 and showed an EF of 50-55% along with mild LAE and moderate MR. Echo report from 12/11/21 reviewed and showed an EF of 65-70% along with mild LVH/ LAE and mild MR. Echo report from 08/20/21 reviewed and showed EF of 50 to 55%. The left ventricle has low normal function. The left ventricle has no regional wall motion abnormalities. The left ventricular internal cavity size was mildly dilated. Echo report from 02/10/20 reviewed and showed an EF of 30-35% along with mildly elevated PA pressure, moderate MR and mild/moderate TR.   Admitted 06/30/22 due to SOB due to HF and AF. Cardiology consult obtained. Antibiotics given for possible COPD. Amiodarone started. CXR showed right pleural effusion. Initially given IV lasix with transition to oral diuretics. Weaned off oxygen to room air. VQ negative for PE. Placed on sodium bicarb due to renal function. Discharged after 5 days. Was in the ED 06/08/22 due to epistaxis and HTN where she was evaluated and released. Admitted 03/25/22 due to Acute on Chronic Heart failure. Initially given IV lasix with transition to oral diuretics. Cardiology consult obtained. Troponin elevation thought to be due to demand ischemia. Discharged after 3 days. ED 03/14/22 due to SOB due to HF exacerbation. Given IV lasix and nebulizer with improvement of symptoms and she was released.  She presents today with a chief complaint of a follow-up visit.     Past Medical History:  Diagnosis Date   B12 deficiency    CHF (congestive heart failure) (HCC)    CKD (chronic kidney disease), stage III (HCC)    COPD (chronic obstructive pulmonary disease) (HCC)    Depression    Endometriosis    Hypertension    Mixed hyperlipidemia    Tobacco abuse     Past Surgical History:  Procedure Laterality Date   ABDOMINAL HYSTERECTOMY     BREAST BIOPSY Right 2015   neg-  FIBROADENOMA   TAH/RSO  1999   secondary to bleeding and endometriosis (Dr Vernie Ammons)   Family History  Problem Relation Age of Onset   Hypercholesterolemia Mother    Rheum arthritis Father    Rheum arthritis Daughter    Fibromyalgia Daughter    Breast cancer Neg Hx    Colon cancer Neg Hx    Social History   Tobacco Use   Smoking status: Every Day    Packs/day: 0.25    Years: 40.00    Total pack years: 10.00    Types: Cigarettes   Smokeless tobacco: Never  Substance Use Topics   Alcohol use: Not Currently    Alcohol/week: 0.0 standard drinks of alcohol    Comment: occasional   Allergies  Allergen Reactions   Sulfate Rash   Codeine Sulfate Nausea Only   Benicar [Olmesartan]     Talked with patient February 10, 2020, intolerance is unclear, tried several medications around that time and one of them gave her a rash but she is not clear which 1.   Amoxicillin Rash   Clindamycin/Lincomycin Rash   Morphine And Related Rash   Penicillins Rash     Review of Systems  Constitutional:  Negative for appetite change and fatigue.  HENT:  Negative for congestion, postnasal drip and sore throat.   Eyes: Negative.  Respiratory:  Negative for cough, shortness of breath and wheezing.   Cardiovascular:  Negative for chest pain, palpitations and leg swelling.  Gastrointestinal:  Negative for abdominal distention and abdominal pain.  Endocrine: Negative.   Genitourinary: Negative.   Musculoskeletal:  Negative for back pain and neck pain.  Skin: Negative.   Allergic/Immunologic: Negative.   Neurological:  Negative for dizziness and light-headedness.  Hematological:  Negative for adenopathy. Does not bruise/bleed easily.  Psychiatric/Behavioral:  Negative for dysphoric mood and sleep disturbance (sleeps on 1 pillow). The patient is not nervous/anxious.       Physical  Exam Vitals and nursing note reviewed.  Constitutional:      General: She is not in acute distress.    Appearance: Normal appearance. She is not ill-appearing.  HENT:     Head: Normocephalic and atraumatic.  Cardiovascular:     Rate and Rhythm: Regular rhythm. Bradycardia present.     Heart sounds: No murmur heard. Pulmonary:     Effort: Pulmonary effort is normal. No respiratory distress.     Breath sounds: No wheezing or rales.  Abdominal:     General: There is no distension.     Palpations: Abdomen is soft.     Tenderness: There is no abdominal tenderness.  Musculoskeletal:        General: No tenderness.     Cervical back: Normal range of motion and neck supple.     Right lower leg: No edema.     Left lower leg: No edema.  Skin:    General: Skin is warm and dry.  Neurological:     General: No focal deficit present.     Mental Status: She is alert and oriented to person, place, and time.  Psychiatric:        Mood and Affect: Mood normal.        Behavior: Behavior normal.        Thought Content: Thought content normal.    Assessment & Plan:  1: Chronic heart failure with preserved ejection fraction with structural changes (mild LAE)- - NYHA class I - euvolemic today - weighing daily; reminded to call for an overnight weight gain of >2 pounds or a weekly weight gain of >5 pounds - weight 123 pounds from last visit here 2.5 months ago  - not adding salt to her food - (entresto had caused hyperkalemia) - on GDMT of Farxiga - BNP 06/30/22 was 1518.4  2: HTN- - BP   - saw PCP [name]) 05/06/22 - BMP 07/05/22 reviewed and showed sodium 138, potassium 5.1, creatinine 2.48 and GFR 21 - saw nephrology [name]) 11/20/21   3: Afib - SR today - saw cardiology Rockey Situ) 04/08/22  4: Tobacco use- - smoking 4-5 cigarettes/day - trying to completely quit - complete cessation discussed for 3 minutes with her   Medication list reviewed.

## 2022-07-08 ENCOUNTER — Ambulatory Visit: Payer: Medicare Other | Admitting: Family

## 2022-07-12 ENCOUNTER — Telehealth: Payer: Self-pay

## 2022-07-12 NOTE — Patient Outreach (Signed)
  Care Coordination TOC Note Transition Care Management Unsuccessful Follow-up Telephone Call  Date of discharge and from where:  07/05/22-[facility]  Attempts:  1st Attempt  Reason for unsuccessful TCM follow-up call:  No answer/busy     Enzo Montgomery, RN,BSN,CCM [place] [facility] Telephonic Care Management Coordinator Direct Phone: [phone] Toll Free: [phone] Fax: [phone]

## 2022-07-12 NOTE — Patient Outreach (Signed)
  Care Coordination TOC Note Transition Care Management Follow-up Telephone Call Date of discharge and from where: 07/05/22-[facility] Dx: acute hypoxemic resp failure How have you been since you were released from the hospital? Patient states things going well for Her. Denies any SOB at present. States mild SOB with exertion but managed with inhaler.  Any questions or concerns? No  Items Reviewed: Did the pt receive and understand the discharge instructions provided? Yes  Medications obtained and verified? Yes  Other? Yes  Any new allergies since your discharge? No  Dietary orders reviewed? Yes Do you have support at home? Yes   Home Care and Equipment/Supplies: Were home health services ordered? not applicable If so, what is the name of the agency? N/A  Has the agency set up a time to come to the patient's home? not applicable Were any new equipment or medical supplies ordered?  No What is the name of the medical supply agency? N/A Were you able to get the supplies/equipment? not applicable Do you have any questions related to the use of the equipment or supplies? No  Functional Questionnaire: (I = Independent and D = Dependent) ADLs: I  Bathing/Dressing- I  Meal Prep- I  Eating- I  Maintaining continence- I  Transferring/Ambulation- I  Managing Meds- I  Follow up appointments reviewed:  PCP Hospital f/u appt confirmed? Yes  Scheduled to see Dr. [name] on 07/16/22. [facility] f/u appt confirmed? N/A Are transportation arrangements needed? No  If their condition worsens, is the pt aware to call PCP or go to the Emergency Dept.? Yes Was the patient provided with contact information for the PCP's office or ED? Yes Was to pt encouraged to call back with questions or concerns? Yes  SDOH assessments and interventions completed:   Yes  Care Coordination Interventions Activated:  Yes   Care Coordination Interventions:  Education provided    Encounter Outcome:  Pt.  Visit Completed    Enzo Montgomery, RN,BSN,CCM [place] [facility] Telephonic Care Management Coordinator Direct Phone: [phone] Toll Free: [phone] Fax: [phone]

## 2022-07-14 ENCOUNTER — Other Ambulatory Visit: Payer: Self-pay | Admitting: Internal Medicine

## 2022-07-16 ENCOUNTER — Ambulatory Visit (INDEPENDENT_AMBULATORY_CARE_PROVIDER_SITE_OTHER): Payer: Medicare Other | Admitting: Internal Medicine

## 2022-07-16 ENCOUNTER — Encounter: Payer: Self-pay | Admitting: Internal Medicine

## 2022-07-16 VITALS — BP 148/78 | HR 83 | Temp 97.7°F | Resp 17 | Ht 65.0 in | Wt 129.6 lb

## 2022-07-16 DIAGNOSIS — R0600 Dyspnea, unspecified: Secondary | ICD-10-CM | POA: Insufficient documentation

## 2022-07-16 DIAGNOSIS — D649 Anemia, unspecified: Secondary | ICD-10-CM

## 2022-07-16 DIAGNOSIS — R0602 Shortness of breath: Secondary | ICD-10-CM | POA: Diagnosis not present

## 2022-07-16 DIAGNOSIS — F325 Major depressive disorder, single episode, in full remission: Secondary | ICD-10-CM

## 2022-07-16 DIAGNOSIS — I5043 Acute on chronic combined systolic (congestive) and diastolic (congestive) heart failure: Secondary | ICD-10-CM

## 2022-07-16 DIAGNOSIS — N184 Chronic kidney disease, stage 4 (severe): Secondary | ICD-10-CM

## 2022-07-16 DIAGNOSIS — I480 Paroxysmal atrial fibrillation: Secondary | ICD-10-CM

## 2022-07-16 DIAGNOSIS — R5383 Other fatigue: Secondary | ICD-10-CM

## 2022-07-16 DIAGNOSIS — K219 Gastro-esophageal reflux disease without esophagitis: Secondary | ICD-10-CM

## 2022-07-16 DIAGNOSIS — I10 Essential (primary) hypertension: Secondary | ICD-10-CM

## 2022-07-16 DIAGNOSIS — E7800 Pure hypercholesterolemia, unspecified: Secondary | ICD-10-CM

## 2022-07-16 DIAGNOSIS — J432 Centrilobular emphysema: Secondary | ICD-10-CM

## 2022-07-16 NOTE — Progress Notes (Unsigned)
Patient ID: [name], female   DOB: 1956-06-30, 66 y.o.   MRN: [id number]   Subjective:    Patient ID: [name], female    DOB: [birth year], 66 y.o.   MRN: [id number]   Patient here for hospital follow up.    HPI Here for hospital follow up.  Hospitalized 06/30/22 - 07/05/22 after presenting with sob.  Work-up was notable for revealing A-fib with RVR, CHF exacerbation.  Cardiology was consulted on admission.  was diuresed. There was also concern for possible underlying COPD exacerbation and she was started on antibiotics as well. Regarding afib, cardiology consulted.  Amiodarone added 07/04/22.  Discharged with tapering dose.  CXR showed right pleural effusion and pulmonary edema. Echo obtained on admission: EF 50 to 87%, grade 2 diastolic dysfunction, no RWMA.  Diuresed and discharged on oral lasix.  Initially required oxygen and this was weaned to room air prior to discharge.  VQ negative for PE.  Completed steroid course.  Treated initially with abx and completed azithromycin and four days of rocephin.  Since discharge, she reports some persistent sob.  Felt better when discharged, but does report some increased DOE.  No increased cough or congestion.  Weight is up.  Normal home weight 120 pounds. No chest pain.  Taking fiber.  Still some issues with bowel movements.  Had small bowel movement this am.      Past Medical History:  Diagnosis Date   B12 deficiency    CHF (congestive heart failure) (HCC)    CKD (chronic kidney disease), stage III (HCC)    COPD (chronic obstructive pulmonary disease) (HCC)    Depression    Endometriosis    Hypertension    Mixed hyperlipidemia    Tobacco abuse    Past Surgical History:  Procedure Laterality Date   ABDOMINAL HYSTERECTOMY     BREAST BIOPSY Right 2015   neg-  FIBROADENOMA   TAH/RSO  1999   secondary to bleeding and endometriosis (Dr Vernie Ammons)   Family History  Problem Relation Age of Onset   Hypercholesterolemia Mother    Rheum  arthritis Father    Rheum arthritis Daughter    Fibromyalgia Daughter    Breast cancer Neg Hx    Colon cancer Neg Hx    Social History   Socioeconomic History   Marital status: Married    Spouse name: Not on file   Number of children: Not on file   Years of education: Not on file   Highest education level: Not on file  Occupational History   Not on file  Tobacco Use   Smoking status: Every Day    Packs/day: 0.25    Years: 40.00    Total pack years: 10.00    Types: Cigarettes   Smokeless tobacco: Never  Vaping Use   Vaping Use: Never used  Substance and Sexual Activity   Alcohol use: Not Currently    Alcohol/week: 0.0 standard drinks of alcohol    Comment: occasional   Drug use: No   Sexual activity: Not Currently  Other Topics Concern   Not on file  Social History Narrative   Lives in [place] with her husband.  She is retired from [employer].  She does not routinely exercise.   Social Determinants of Health   Financial Resource Strain: Low Risk  (11/27/2021)   Overall Financial Resource Strain (CARDIA)    Difficulty of Paying Living Expenses: Not hard at all  Food Insecurity: No Food Insecurity (07/12/2022)  Hunger Vital Sign    Worried About Running Out of Food in the Last Year: Never true    Ran Out of Food in the Last Year: Never true  Transportation Needs: No Transportation Needs (07/12/2022)   PRAPARE - Hydrologist (Medical): No    Lack of Transportation (Non-Medical): No  Physical Activity: Sufficiently Active (11/27/2021)   Exercise Vital Sign    Days of Exercise per Week: 7 days    Minutes of Exercise per Session: 30 min  Stress: No Stress Concern Present (11/27/2021)   [place]    Feeling of Stress : Not at all  Social Connections: Unknown (11/27/2021)   Social Connection and Isolation Panel [NHANES]    Frequency of Communication with Friends and  Family: Not on file    Frequency of Social Gatherings with Friends and Family: Not on file    Attends Religious Services: Not on file    Active Member of Clubs or Organizations: Not on file    Attends Archivist Meetings: Not on file    Marital Status: Married     Review of Systems  Constitutional:  Negative for appetite change and fever.  HENT:  Negative for congestion and sinus pressure.   Respiratory:  Positive for shortness of breath. Negative for cough and chest tightness.   Cardiovascular:  Negative for chest pain and palpitations.       No increased leg swelling.   Gastrointestinal:  Negative for abdominal pain, nausea and vomiting.       Bowel issues as outlined.   Genitourinary:  Negative for difficulty urinating and dysuria.  Musculoskeletal:  Negative for joint swelling and myalgias.  Skin:  Negative for color change and rash.  Neurological:  Negative for dizziness and headaches.  Psychiatric/Behavioral:  Negative for agitation and dysphoric mood.        Objective:     BP (!) 148/78 (BP Location: Left Arm, Patient Position: Sitting, Cuff Size: Small)   Pulse 83   Temp 97.7 F (36.5 C) (Temporal)   Resp 17   Ht _0  (1.651 m)   Wt 129 lb 9.6 oz (58.8 kg)   SpO2 92%   BMI 21.57 kg/m  Wt Readings from Last 3 Encounters:  07/16/22 129 lb 9.6 oz (58.8 kg)  06/30/22 120 lb (54.4 kg)  06/08/22 130 lb (59 kg)    Physical Exam HENT:     Nose: Nose normal.  Neck:     Thyroid: No thyromegaly.  Cardiovascular:     Rate and Rhythm: Normal rate and regular rhythm.  Pulmonary:     Effort: No respiratory distress.     Breath sounds: Normal breath sounds. No wheezing.  Abdominal:     General: Bowel sounds are normal.     Palpations: Abdomen is soft.     Tenderness: There is no abdominal tenderness.  Musculoskeletal:        General: No tenderness.     Cervical back: Neck supple.  Lymphadenopathy:     Cervical: No cervical adenopathy.       Outpatient Encounter Medications as of 07/16/2022  Medication Sig   acetaminophen (TYLENOL) 325 MG tablet Take 650 mg by mouth every 6 (six) hours as needed.   albuterol (VENTOLIN HFA) 108 (90 Base) MCG/ACT inhaler Inhale 2 puffs into the lungs every 6 (six) hours as needed for wheezing or shortness of breath.   amiodarone (PACERONE) 200 MG tablet  Take 2 tablets twice a day for 6 more days then 1 tablet twice a day for 7 days then 1 tablet daily   apixaban (ELIQUIS) 2.5 MG TABS tablet Take 1 tablet (2.5 mg total) by mouth 2 (two) times daily.   atorvastatin (LIPITOR) 40 MG tablet TAKE ONE TABLET BY MOUTH EVERY DAY   buPROPion (WELLBUTRIN XL) 150 MG 24 hr tablet TAKE ONE TABLET BY MOUTH EVERY DAY   calcium carbonate (OS-CAL - DOSED IN MG OF ELEMENTAL CALCIUM) 1250 (500 Ca) MG tablet Take 1 tablet (500 mg of elemental calcium total) by mouth 3 (three) times daily with meals.   diltiazem (CARDIZEM CD) 120 MG 24 hr capsule Take 1 capsule (120 mg total) by mouth daily.   FARXIGA 10 MG TABS tablet Take 10 mg by mouth daily.   ferrous sulfate 325 (65 FE) MG tablet Take 1 tablet (325 mg total) by mouth 2 (two) times daily with a meal.   Fluticasone-Umeclidin-Vilant (TRELEGY ELLIPTA) 100-62.5-25 MCG/ACT AEPB Inhale 1 puff into the lungs daily.   furosemide (LASIX) 40 MG tablet Take 1 tablet (40 mg total) by mouth daily. Take 40 mg daily and and extra 40 mg as needed for fluid retention   hydrALAZINE (APRESOLINE) 25 MG tablet Take 1 tablet (25 mg total) by mouth 3 (three) times daily.   loratadine (CLARITIN) 10 MG tablet Take 10 mg by mouth daily.   omeprazole (PRILOSEC) 20 MG capsule Take 20 mg by mouth daily.   venlafaxine XR (EFFEXOR-XR) 150 MG 24 hr capsule TAKE ONE CAPSULE BY MOUTH EVERY DAY   No facility-administered encounter medications on file as of 07/16/2022.     Lab Results  Component Value Date   WBC 8.9 07/16/2022   HGB 11.7 (L) 07/16/2022   HCT 36.4 07/16/2022   PLT 258.0  07/16/2022   GLUCOSE 70 07/16/2022   CHOL 183 03/27/2022   TRIG 83 03/27/2022   HDL 87 03/27/2022   LDLDIRECT 53.3 10/14/2012   LDLCALC 79 03/27/2022   ALT 20 07/16/2022   AST 17 07/16/2022   NA 138 07/16/2022   K 5.1 07/16/2022   CL 107 07/16/2022   CREATININE 2.82 (H) 07/16/2022   BUN 47 (H) 07/16/2022   CO2 19 07/16/2022   TSH 10.17 (H) 07/16/2022   INR 0.9 12/10/2021   HGBA1C 6.0 02/28/2022    ECHOCARDIOGRAM COMPLETE  Result Date: 07/01/2022    ECHOCARDIOGRAM REPORT   Patient Name:   JENNALYN CAWLEY Date of Exam: 07/01/2022 Medical Rec #:  [id number]     Height:       65.0 in Accession #:    [id number]    Weight:       120.0 lb Date of Birth:  07-Jul-1956    BSA:          1.592 m Patient Age:    71 years      BP:           137/66 mmHg Patient Gender: F             HR:           99 bpm. Exam Location:  [facility] Procedure: 2D Echo, Color Doppler, Cardiac Doppler and Strain Analysis Indications:     R06.00 Dyspnea  History:         Patient has prior history of Echocardiogram examinations, most                  recent 12/11/2021. CHF, CKD and COPD;  Risk Factors:Hypertension,                  Dyslipidemia and Current Smoker.  Sonographer:     Charmayne Sheer Referring Phys:  [id number] Rise Mu Diagnosing Phys: Kathlyn Sacramento MD  Sonographer Comments: Global longitudinal strain was attempted. IMPRESSIONS  1. Left ventricular ejection fraction, by estimation, is 50 to 55%. The left ventricle has low normal function. The left ventricle has no regional wall motion abnormalities. The left ventricular internal cavity size was mildly dilated. Left ventricular diastolic parameters are consistent with Grade II diastolic dysfunction (pseudonormalization).  2. Right ventricular systolic function is normal. The right ventricular size is normal.  3. Left atrial size was mildly dilated.  4. The mitral valve is normal in structure. Moderate mitral valve regurgitation. No evidence of mitral stenosis.  5. The aortic valve is  normal in structure. Aortic valve regurgitation is not visualized. Mild aortic valve stenosis. Aortic valve area, by VTI measures 1.92 cm. Aortic valve mean gradient measures 8.0 mmHg.  6. The inferior vena cava is normal in size with greater than 50% respiratory variability, suggesting right atrial pressure of 3 mmHg. FINDINGS  Left Ventricle: Left ventricular ejection fraction, by estimation, is 50 to 55%. The left ventricle has low normal function. The left ventricle has no regional wall motion abnormalities. Global longitudinal strain performed but not reported based on interpreter judgement due to suboptimal tracking. The left ventricular internal cavity size was mildly dilated. There is no left ventricular hypertrophy. Left ventricular diastolic parameters are consistent with Grade II diastolic dysfunction (pseudonormalization). Right Ventricle: The right ventricular size is normal. No increase in right ventricular wall thickness. Right ventricular systolic function is normal. Left Atrium: Left atrial size was mildly dilated. Right Atrium: Right atrial size was normal in size. Pericardium: There is no evidence of pericardial effusion. Mitral Valve: The mitral valve is normal in structure. There is moderate thickening of the mitral valve leaflet(s). There is mild calcification of the mitral valve leaflet(s). Moderate mitral valve regurgitation. No evidence of mitral valve stenosis. Tricuspid Valve: The tricuspid valve is normal in structure. Tricuspid valve regurgitation is not demonstrated. No evidence of tricuspid stenosis. Aortic Valve: The aortic valve is normal in structure. Aortic valve regurgitation is not visualized. Mild aortic stenosis is present. Aortic valve mean gradient measures 8.0 mmHg. Aortic valve peak gradient measures 12.8 mmHg. Aortic valve area, by VTI measures 1.92 cm. Pulmonic Valve: The pulmonic valve was normal in structure. Pulmonic valve regurgitation is not visualized. No evidence  of pulmonic stenosis. Aorta: The aortic root is normal in size and structure. Venous: The inferior vena cava is normal in size with greater than 50% respiratory variability, suggesting right atrial pressure of 3 mmHg. IAS/Shunts: No atrial level shunt detected by color flow Doppler.  LEFT VENTRICLE PLAX 2D LVIDd:         5.20 cm   Diastology LVIDs:         4.00 cm   LV e' medial:    7.18 cm/s LV PW:         1.10 cm   LV E/e' medial:  21.4 LV IVS:        0.70 cm   LV e' lateral:   12.80 cm/s LVOT diam:     1.80 cm   LV E/e' lateral: 12.0 LV SV:         63 LV SV Index:   40 LVOT Area:     2.54 cm  RIGHT VENTRICLE  RV Basal diam:  3.70 cm RV S prime:     13.20 cm/s LEFT ATRIUM             Index        RIGHT ATRIUM           Index LA diam:        5.10 cm 3.20 cm/m   RA Area:     15.40 cm LA Vol (A2C):   62.6 ml 39.32 ml/m  RA Volume:   39.30 ml  24.68 ml/m LA Vol (A4C):   52.4 ml 32.91 ml/m LA Biplane Vol: 58.6 ml 36.81 ml/m  AORTIC VALVE                     PULMONIC VALVE AV Area (Vmax):    1.82 cm      PV Vmax:       1.44 m/s AV Area (Vmean):   1.65 cm      PV Vmean:      102.000 cm/s AV Area (VTI):     1.92 cm      PV VTI:        0.258 m AV Vmax:           179.00 cm/s   PV Peak grad:  8.3 mmHg AV Vmean:          136.000 cm/s  PV Mean grad:  5.0 mmHg AV VTI:            0.329 m AV Peak Grad:      12.8 mmHg AV Mean Grad:      8.0 mmHg LVOT Vmax:         128.00 cm/s LVOT Vmean:        88.300 cm/s LVOT VTI:          0.248 m LVOT/AV VTI ratio: 0.75  AORTA Ao Root diam: 3.10 cm MITRAL VALVE MV Area (PHT): 6.32 cm     SHUNTS MV Decel Time: 120 msec     Systemic VTI:  0.25 m MV E velocity: 154.00 cm/s  Systemic Diam: 1.80 cm MV A velocity: 143.00 cm/s MV E/A ratio:  1.08 Kathlyn Sacramento MD Electronically signed by Kathlyn Sacramento MD Signature Date/Time: 07/01/2022/3:03:52 PM    Final    NM Pulmonary Perfusion  Result Date: 07/01/2022 CLINICAL DATA:  PE suspected, positive D-dimer EXAM: NUCLEAR MEDICINE PERFUSION  LUNG SCAN TECHNIQUE: Perfusion images were obtained in multiple projections after intravenous injection of radiopharmaceutical. Ventilation scans intentionally deferred if perfusion scan and chest x-ray adequate for interpretation during COVID 19 epidemic. RADIOPHARMACEUTICALS:  4.43 mCi Tc-19mMAA IV COMPARISON:  Chest radiograph, 06/30/2022 FINDINGS: Normal, homogeneous perfusion of the lungs. No suspicious perfusion defect. Cardiomegaly. IMPRESSION: 1. Very low probability for pulmonary embolism by modified perfusion only PIOPED criteria (PE absent). 2.  Cardiomegaly. Electronically Signed   By: ADelanna AhmadiM.D.   On: 07/01/2022 10:50   Reviewed and confirmed medications currently taking.     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 Continue coreg and eliquis.  Discharged on lasix.  Taking daily. She weighs herself regularly.  Weight is up.  Her baseline weight at home is 120lbs.  She was 124 pounds this am.  More sob with exertion. She took an extra lasix yesterday.  Will have her continue lasix bid x  3 more days and then decrease back to daily dosing. Continue to weigh daily. Follow pressures.  Follow metabolic panel.  Keep w/u with Darylene Price (heart failure clinic) Monday 07/22/22.       Anemia    Follow cbc.       Chronic obstructive pulmonary disease (HCC)    Treated with abx in hospital.  Will increased lasix as outlined. No increased cough or congestion.  Follow.       CKD (chronic kidney disease), stage IV (HCC)    Avoid antiinflammatories.  Will need to monitor kidney function with increased lasix.  Recheck met b today.       Depression, major, single episode, complete remission ([place])    Continue effexor and wellbutrin. Has good support.  Follow.       Essential hypertension    Continue amlodipine and hydralazine.  Adjust lasix  as outlined. Follow pressures.  Follow metabolic panel.       Fatigue    Recently admitted as outlined.  Persistent sob with exertion.  Has worsened some since discharge.  EKG - SR - reviewed with cardiology.  Adjust lasix as outlined.  Check metabolic panel and cbc.  Also check tsh - on amiodarone.       Relevant Orders   CBC with Differential/Platelet (Completed)   TSH (Completed)   Comprehensive metabolic panel (Completed)   GERD (gastroesophageal reflux disease)    No upper symptoms reported.  On prilosec.       Hypercholesterolemia    Continue lipitor.  Low cholesterol diet and exercise.  Follow lipid panel and liver function tests.        Paroxysmal atrial fibrillation (HCC)    On amiodarone.  Continue eliquis.  In SR today.  Follow.       SOB (shortness of breath) - Primary    Off entresto due to hyperkalemia. Continue coreg and eliquis.  Discharged on lasix.  Taking daily. She weighs herself regularly.  Weight is up.  Her baseline weight at home is 120lbs.  She was 124 pounds this am.  More sob with exertion. EKG - SR.  On amiodarone. She took an extra lasix yesterday.  Will have her continue lasix bid x 3 more days and then decrease back to daily dosing. Continue to weigh daily. Follow pressures.  Follow metabolic panel.  Keep w/u with Darylene Price (heart failure clinic) Monday 07/22/22.       Relevant Orders   EKG 12-Lead (Completed)     [name], MD

## 2022-07-16 NOTE — Patient Instructions (Addendum)
Increase lasix to twice a day for the next three days.  Monitor your weight. Call with update.   Keep appt with Otila Kluver on 07/22/22.

## 2022-07-17 LAB — CBC WITH DIFFERENTIAL/PLATELET
Basophils Absolute: 0.1 10*3/uL (ref 0.0–0.1)
Basophils Relative: 0.8 % (ref 0.0–3.0)
Eosinophils Absolute: 0.2 10*3/uL (ref 0.0–0.7)
Eosinophils Relative: 1.8 % (ref 0.0–5.0)
HCT: 36.4 % (ref 36.0–46.0)
Hemoglobin: 11.7 g/dL — ABNORMAL LOW (ref 12.0–15.0)
Lymphocytes Relative: 13.1 % (ref 12.0–46.0)
Lymphs Abs: 1.2 10*3/uL (ref 0.7–4.0)
MCHC: 32.3 g/dL (ref 30.0–36.0)
MCV: 91.2 fl (ref 78.0–100.0)
Monocytes Absolute: 0.7 10*3/uL (ref 0.1–1.0)
Monocytes Relative: 7.7 % (ref 3.0–12.0)
Neutro Abs: 6.9 10*3/uL (ref 1.4–7.7)
Neutrophils Relative %: 76.6 % (ref 43.0–77.0)
Platelets: 258 10*3/uL (ref 150.0–400.0)
RBC: 3.99 Mil/uL (ref 3.87–5.11)
RDW: 17.6 % — ABNORMAL HIGH (ref 11.5–15.5)
WBC: 8.9 10*3/uL (ref 4.0–10.5)

## 2022-07-17 LAB — COMPREHENSIVE METABOLIC PANEL
ALT: 20 U/L (ref 0–35)
AST: 17 U/L (ref 0–37)
Albumin: 4.1 g/dL (ref 3.5–5.2)
Alkaline Phosphatase: 115 U/L (ref 39–117)
BUN: 47 mg/dL — ABNORMAL HIGH (ref 6–23)
CO2: 19 mEq/L (ref 19–32)
Calcium: 8.8 mg/dL (ref 8.4–10.5)
Chloride: 107 mEq/L (ref 96–112)
Creatinine, Ser: 2.82 mg/dL — ABNORMAL HIGH (ref 0.40–1.20)
GFR: 16.85 mL/min — ABNORMAL LOW (ref >60.00)
Glucose, Bld: 70 mg/dL (ref 70–99)
Potassium: 5.1 mEq/L (ref 3.5–5.1)
Sodium: 138 mEq/L (ref 135–145)
Total Bilirubin: 0.4 mg/dL (ref 0.2–1.2)
Total Protein: 6.3 g/dL (ref 6.0–8.3)

## 2022-07-17 LAB — TSH: TSH: 10.17 u[IU]/mL — ABNORMAL HIGH (ref 0.35–5.50)

## 2022-07-18 ENCOUNTER — Other Ambulatory Visit: Payer: Self-pay | Admitting: Internal Medicine

## 2022-07-18 ENCOUNTER — Encounter: Payer: Self-pay | Admitting: Internal Medicine

## 2022-07-18 DIAGNOSIS — I480 Paroxysmal atrial fibrillation: Secondary | ICD-10-CM

## 2022-07-18 DIAGNOSIS — I5032 Chronic diastolic (congestive) heart failure: Secondary | ICD-10-CM

## 2022-07-18 DIAGNOSIS — N184 Chronic kidney disease, stage 4 (severe): Secondary | ICD-10-CM

## 2022-07-18 DIAGNOSIS — D649 Anemia, unspecified: Secondary | ICD-10-CM

## 2022-07-18 DIAGNOSIS — R7989 Other specified abnormal findings of blood chemistry: Secondary | ICD-10-CM

## 2022-07-18 NOTE — Assessment & Plan Note (Signed)
Recently admitted as outlined.  Persistent sob with exertion.  Has worsened some since discharge.  EKG - SR - reviewed with cardiology.  Adjust lasix as outlined.  Check metabolic panel and cbc.  Also check tsh - on amiodarone.

## 2022-07-18 NOTE — Assessment & Plan Note (Signed)
Treated with abx in hospital.  Will increased lasix as outlined. No increased cough or congestion.  Follow.

## 2022-07-18 NOTE — Assessment & Plan Note (Signed)
Off entresto due to hyperkalemia. Continue coreg and eliquis.  Discharged on lasix.  Taking daily. She weighs herself regularly.  Weight is up.  Her baseline weight at home is 120lbs.  She was 124 pounds this am.  More sob with exertion. She took an extra lasix yesterday.  Will have her continue lasix bid x 3 more days and then decrease back to daily dosing. Continue to weigh daily. Follow pressures.  Follow metabolic panel.  Keep w/u with Darylene Price (heart failure clinic) Monday 07/22/22.

## 2022-07-18 NOTE — Assessment & Plan Note (Signed)
Follow cbc.  

## 2022-07-18 NOTE — Assessment & Plan Note (Signed)
On amiodarone.  Continue eliquis.  In SR today.  Follow.

## 2022-07-18 NOTE — Assessment & Plan Note (Signed)
Continue amlodipine and hydralazine.  Adjust lasix as outlined. Follow pressures.  Follow metabolic panel.

## 2022-07-18 NOTE — Assessment & Plan Note (Signed)
Off entresto due to hyperkalemia. Continue coreg and eliquis.  Discharged on lasix.  Taking daily. She weighs herself regularly.  Weight is up.  Her baseline weight at home is 120lbs.  She was 124 pounds this am.  More sob with exertion. EKG - SR.  On amiodarone. She took an extra lasix yesterday.  Will have her continue lasix bid x 3 more days and then decrease back to daily dosing. Continue to weigh daily. Follow pressures.  Follow metabolic panel.  Keep w/u with Darylene Price (heart failure clinic) Monday 07/22/22.

## 2022-07-18 NOTE — Assessment & Plan Note (Signed)
Continue effexor and wellbutrin. Has good support.  Follow.

## 2022-07-18 NOTE — Assessment & Plan Note (Signed)
Continue lipitor.  Low cholesterol diet and exercise.  Follow lipid panel and liver function tests.   

## 2022-07-18 NOTE — Progress Notes (Signed)
Order placed for f/u labs.  

## 2022-07-18 NOTE — Assessment & Plan Note (Signed)
No upper symptoms reported. On prilosec.  

## 2022-07-18 NOTE — Assessment & Plan Note (Signed)
Avoid antiinflammatories.  Will need to monitor kidney function with increased lasix.  Recheck met b today.

## 2022-07-21 NOTE — Progress Notes (Signed)
Patient ID: Dawn Sherman, female    DOB: Feb 22, 1956, 66 y.o.   MRN: [id number]   Dawn Sherman is a 66 y/o female with a history of HTN, CKD, hyperlipidemia, depression, current tobacco use and chronic heart failure.   Echo report from 07/01/22 and showed an EF of 50-55% along with mild LAE and moderate MR. Echo report from 12/11/21 reviewed and showed an EF of 65-70% along with mild LVH/ LAE and mild MR. Echo report from 08/20/21 reviewed and showed EF of 50 to 55%. The left ventricle has low normal function. The left ventricle has no regional wall motion abnormalities. The left ventricular internal cavity size was mildly dilated. Echo report from 02/10/20 reviewed and showed an EF of 30-35% along with mildly elevated PA pressure, moderate MR and mild/moderate TR.   Admitted 06/30/22 due to SOB due to HF and AF. Cardiology consult obtained. Antibiotics given for possible COPD. Amiodarone started. CXR showed right pleural effusion. Initially given IV lasix with transition to oral diuretics. Weaned off oxygen to room air. VQ negative for PE. Placed on sodium bicarb due to renal function. Discharged after 5 days. Was in the ED 06/08/22 due to epistaxis and HTN where she was evaluated and released. Admitted 03/25/22 due to Acute on Chronic Heart failure. Initially given IV lasix with transition to oral diuretics. Cardiology consult obtained. Troponin elevation thought to be due to demand ischemia. Discharged after 3 days. ED 03/14/22 due to SOB due to HF exacerbation. Given IV lasix and nebulizer with improvement of symptoms and she was released.  She presents today for a follow-up visit with a chief complaint of minimal shortness of breath with moderate exertion. Describes this as occurring on an intermittent basis. She has associated minimal pedal edema and slight weight gain along with this. She denies any difficulty sleeping, dizziness, abdominal distention, palpitations, chest pain, wheezing, cough or fatigue.    Had a recent 3 day increase in her diuretic and she says that she feels much better and had a resultant loss of 4 pounds per her home scale. She is now back to her daily furosemide dose.   Past Medical History:  Diagnosis Date   B12 deficiency    CHF (congestive heart failure) (HCC)    CKD (chronic kidney disease), stage III (HCC)    COPD (chronic obstructive pulmonary disease) ([place])    Depression    Endometriosis    Hypertension    Mixed hyperlipidemia    Tobacco abuse    Past Surgical History:  Procedure Laterality Date   ABDOMINAL HYSTERECTOMY     BREAST BIOPSY Right 2015   neg-  FIBROADENOMA   TAH/RSO  1999   secondary to bleeding and endometriosis (Dr Vernie Ammons)   Family History  Problem Relation Age of Onset   Hypercholesterolemia Mother    Rheum arthritis Father    Rheum arthritis Daughter    Fibromyalgia Daughter    Breast cancer Neg Hx    Colon cancer Neg Hx    Social History   Tobacco Use   Smoking status: Every Day    Packs/day: 0.25    Years: 40.00    Total pack years: 10.00    Types: Cigarettes   Smokeless tobacco: Never  Substance Use Topics   Alcohol use: Not Currently    Alcohol/week: 0.0 standard drinks of alcohol    Comment: occasional   Allergies  Allergen Reactions   Sulfate Rash   Codeine Sulfate Nausea Only   Benicar [Olmesartan]  Talked with patient February 10, 2020, intolerance is unclear, tried several medications around that time and one of them gave her a rash but she is not clear which 1.   Amoxicillin Rash   Clindamycin/Lincomycin Rash   Morphine And Related Rash   Penicillins Rash     Prior to Admission medications   Medication Sig Start Date End Date Taking? Authorizing Provider  albuterol (VENTOLIN HFA) 108 (90 Base) MCG/ACT inhaler Inhale 2 puffs into the lungs every 6 (six) hours as needed for wheezing or shortness of breath. 03/14/22  Yes Lucrezia Starch, MD  amiodarone (PACERONE) 200 MG tablet Take 2 tablets twice a  day for 6 more days then 1 tablet twice a day for 7 days then 1 tablet daily 07/05/22  Yes Dwyane Dee, MD  apixaban (ELIQUIS) 2.5 MG TABS tablet Take 1 tablet (2.5 mg total) by mouth 2 (two) times daily. 04/08/22  Yes Gollan, Kathlene November, MD  atorvastatin (LIPITOR) 40 MG tablet TAKE ONE TABLET BY MOUTH EVERY DAY 05/29/22  Yes [name], MD  buPROPion (WELLBUTRIN XL) 150 MG 24 hr tablet TAKE ONE TABLET BY MOUTH EVERY DAY 07/15/22  Yes [name], MD  calcium carbonate (OS-CAL - DOSED IN MG OF ELEMENTAL CALCIUM) 1250 (500 Ca) MG tablet Take 1 tablet (500 mg of elemental calcium total) by mouth 3 (three) times daily with meals. 12/14/21  Yes Fritzi Mandes, MD  diltiazem (CARDIZEM CD) 120 MG 24 hr capsule Take 1 capsule (120 mg total) by mouth daily. 07/06/22  Yes Dwyane Dee, MD  FARXIGA 10 MG TABS tablet Take 10 mg by mouth daily. 11/20/21  Yes [provider]  ferrous sulfate 325 (65 FE) MG tablet Take 1 tablet (325 mg total) by mouth 2 (two) times daily with a meal. 12/14/21  Yes Fritzi Mandes, MD  Fluticasone-Umeclidin-Vilant (TRELEGY ELLIPTA) 100-62.5-25 MCG/ACT AEPB Inhale 1 puff into the lungs daily. 03/05/22  Yes [name], MD  furosemide (LASIX) 40 MG tablet Take 1 tablet (40 mg total) by mouth daily. Take 40 mg daily and and extra 40 mg as needed for fluid retention 04/24/22  Yes Darylene Price A, FNP  hydrALAZINE (APRESOLINE) 25 MG tablet Take 1 tablet (25 mg total) by mouth 3 (three) times daily. 01/31/22  Yes Mashonda Broski A, FNP  loratadine (CLARITIN) 10 MG tablet Take 10 mg by mouth daily.   Yes [provider]  omeprazole (PRILOSEC) 20 MG capsule Take 20 mg by mouth daily.   Yes [provider]  venlafaxine XR (EFFEXOR-XR) 150 MG 24 hr capsule TAKE ONE CAPSULE BY MOUTH EVERY DAY 06/24/22  Yes [name], MD   Review of Systems  Constitutional:  Negative for appetite change and fatigue.  HENT:  Negative for congestion, postnasal drip and sore  throat.   Eyes: Negative.   Respiratory:  Positive for shortness of breath. Negative for cough and wheezing.   Cardiovascular:  Positive for leg swelling (at times). Negative for chest pain and palpitations.  Gastrointestinal:  Negative for abdominal distention and abdominal pain.  Endocrine: Negative.   Genitourinary: Negative.   Musculoskeletal:  Negative for back pain and neck pain.  Skin: Negative.   Allergic/Immunologic: Negative.   Neurological:  Negative for dizziness and light-headedness.  Hematological:  Negative for adenopathy. Does not bruise/bleed easily.  Psychiatric/Behavioral:  Negative for dysphoric mood and sleep disturbance (sleeps on 1 pillow). The patient is not nervous/anxious.    Vitals:   07/22/22 1553  BP: (!) 130/55  Pulse:  89  Resp: 16  SpO2: 96%  Weight: 127 lb 8 oz (57.8 kg)   Wt Readings from Last 3 Encounters:  07/22/22 127 lb 8 oz (57.8 kg)  07/16/22 129 lb 9.6 oz (58.8 kg)  06/30/22 120 lb (54.4 kg)   Lab Results  Component Value Date   CREATININE 2.82 (H) 07/16/2022   CREATININE 2.48 (H) 07/05/2022   CREATININE 2.52 (H) 07/04/2022   Physical Exam Vitals and nursing note reviewed.  Constitutional:      General: She is not in acute distress.    Appearance: Normal appearance. She is not ill-appearing.  HENT:     Head: Normocephalic and atraumatic.  Cardiovascular:     Rate and Rhythm: Normal rate and regular rhythm.     Heart sounds: No murmur heard. Pulmonary:     Effort: Pulmonary effort is normal. No respiratory distress.     Breath sounds: No wheezing or rales.  Abdominal:     General: There is no distension.     Palpations: Abdomen is soft.     Tenderness: There is no abdominal tenderness.  Musculoskeletal:        General: No tenderness.     Cervical back: Normal range of motion and neck supple.     Right lower leg: No edema.     Left lower leg: No edema.  Skin:    General: Skin is warm and dry.  Neurological:     General:  No focal deficit present.     Mental Status: She is alert and oriented to person, place, and time.  Psychiatric:        Mood and Affect: Mood normal.        Behavior: Behavior normal.        Thought Content: Thought content normal.    Assessment & Plan:  1: Chronic heart failure with preserved ejection fraction with structural changes (mild LAE)- - NYHA class II - euvolemic today - weighing daily; reminded to call for an overnight weight gain of >2 pounds or a weekly weight gain of >5 pounds - weight up 4 pounds from last visit here 3 months ago  - took BID furosemide for 3 days with resultant 4 pound weight loss per home scale with improvement of symptoms; now back to her daily furosemide - not adding salt to her food - (entresto had caused hyperkalemia) - on GDMT of farxiga - BNP 06/30/22 was 1518.4  2: HTN- - BP looks good (130/55) - saw PCP [name]) 07/16/22 & had lasix increased to BID X 3 days - BMP 07/16/22 reviewed and showed sodium 138, potassium 5.1, creatinine 2.82 and GFR 16.85 - saw nephrology [name]) 11/20/21; she is unsure of when she returns  3: Afib - SR today - saw cardiology Rockey Situ) 04/08/22 - currently on amiodarone, apixaban and diltiazem  4: Tobacco use- - smoking 4-5 cigarettes/day - admits that she has no desire to quit smoking; has been smoking since the age of 81 - complete cessation discussed for 3 minutes with her   Medication list reviewed.   Return in 3 months, sooner if needed.

## 2022-07-22 ENCOUNTER — Encounter: Payer: Self-pay | Admitting: Family

## 2022-07-22 ENCOUNTER — Ambulatory Visit: Payer: Medicare Other | Attending: Family | Admitting: Family

## 2022-07-22 VITALS — BP 130/55 | HR 89 | Resp 16 | Wt 127.5 lb

## 2022-07-22 DIAGNOSIS — I130 Hypertensive heart and chronic kidney disease with heart failure and stage 1 through stage 4 chronic kidney disease, or unspecified chronic kidney disease: Secondary | ICD-10-CM | POA: Diagnosis present

## 2022-07-22 DIAGNOSIS — Z7901 Long term (current) use of anticoagulants: Secondary | ICD-10-CM | POA: Insufficient documentation

## 2022-07-22 DIAGNOSIS — I480 Paroxysmal atrial fibrillation: Secondary | ICD-10-CM

## 2022-07-22 DIAGNOSIS — F32A Depression, unspecified: Secondary | ICD-10-CM | POA: Diagnosis not present

## 2022-07-22 DIAGNOSIS — Z79899 Other long term (current) drug therapy: Secondary | ICD-10-CM | POA: Insufficient documentation

## 2022-07-22 DIAGNOSIS — Z720 Tobacco use: Secondary | ICD-10-CM | POA: Diagnosis not present

## 2022-07-22 DIAGNOSIS — F17210 Nicotine dependence, cigarettes, uncomplicated: Secondary | ICD-10-CM | POA: Diagnosis not present

## 2022-07-22 DIAGNOSIS — I5032 Chronic diastolic (congestive) heart failure: Secondary | ICD-10-CM

## 2022-07-22 DIAGNOSIS — I10 Essential (primary) hypertension: Secondary | ICD-10-CM | POA: Diagnosis not present

## 2022-07-22 DIAGNOSIS — E875 Hyperkalemia: Secondary | ICD-10-CM | POA: Diagnosis not present

## 2022-07-22 DIAGNOSIS — I4891 Unspecified atrial fibrillation: Secondary | ICD-10-CM | POA: Diagnosis not present

## 2022-07-22 NOTE — Patient Instructions (Signed)
Continue weighing daily and call for an overnight weight gain of 3 pounds or more or a weekly weight gain of more than 5 pounds.   If you have voicemail, please make sure your mailbox is cleaned out so that we may leave a message and please make sure to listen to any voicemails.     

## 2022-07-26 ENCOUNTER — Other Ambulatory Visit (INDEPENDENT_AMBULATORY_CARE_PROVIDER_SITE_OTHER): Payer: Medicare Other

## 2022-07-26 DIAGNOSIS — N184 Chronic kidney disease, stage 4 (severe): Secondary | ICD-10-CM | POA: Diagnosis not present

## 2022-07-26 DIAGNOSIS — I480 Paroxysmal atrial fibrillation: Secondary | ICD-10-CM

## 2022-07-26 DIAGNOSIS — D649 Anemia, unspecified: Secondary | ICD-10-CM

## 2022-07-26 LAB — BASIC METABOLIC PANEL
BUN: 34 mg/dL — ABNORMAL HIGH (ref 6–23)
CO2: 21 mEq/L (ref 19–32)
Calcium: 8.4 mg/dL (ref 8.4–10.5)
Chloride: 107 mEq/L (ref 96–112)
Creatinine, Ser: 2.56 mg/dL — ABNORMAL HIGH (ref 0.40–1.20)
GFR: 18.92 mL/min — ABNORMAL LOW (ref >60.00)
Glucose, Bld: 90 mg/dL (ref 70–99)
Potassium: 4.9 mEq/L (ref 3.5–5.1)
Sodium: 138 mEq/L (ref 135–145)

## 2022-07-26 LAB — VITAMIN B12: Vitamin B-12: 426 pg/mL (ref 211–911)

## 2022-07-26 LAB — CBC WITH DIFFERENTIAL/PLATELET
Basophils Absolute: 0.1 10*3/uL (ref 0.0–0.1)
Basophils Relative: 1 % (ref 0.0–3.0)
Eosinophils Absolute: 0.1 10*3/uL (ref 0.0–0.7)
Eosinophils Relative: 1.7 % (ref 0.0–5.0)
HCT: 33.5 % — ABNORMAL LOW (ref 36.0–46.0)
Hemoglobin: 11.1 g/dL — ABNORMAL LOW (ref 12.0–15.0)
Lymphocytes Relative: 12.6 % (ref 12.0–46.0)
Lymphs Abs: 0.9 10*3/uL (ref 0.7–4.0)
MCHC: 33 g/dL (ref 30.0–36.0)
MCV: 90.4 fl (ref 78.0–100.0)
Monocytes Absolute: 0.7 10*3/uL (ref 0.1–1.0)
Monocytes Relative: 9.4 % (ref 3.0–12.0)
Neutro Abs: 5.2 10*3/uL (ref 1.4–7.7)
Neutrophils Relative %: 75.3 % (ref 43.0–77.0)
Platelets: 229 10*3/uL (ref 150.0–400.0)
RBC: 3.71 Mil/uL — ABNORMAL LOW (ref 3.87–5.11)
RDW: 16.7 % — ABNORMAL HIGH (ref 11.5–15.5)
WBC: 7 10*3/uL (ref 4.0–10.5)

## 2022-07-26 LAB — T4, FREE: Free T4: 0.57 ng/dL — ABNORMAL LOW (ref 0.60–1.60)

## 2022-07-26 LAB — TSH: TSH: 12.62 u[IU]/mL — ABNORMAL HIGH (ref 0.35–5.50)

## 2022-07-26 LAB — IBC + FERRITIN
Ferritin: 20.3 ng/mL (ref 10.0–291.0)
Iron: 57 ug/dL (ref 42–145)
Saturation Ratios: 15.8 % — ABNORMAL LOW (ref 20.0–50.0)
TIBC: 361.2 ug/dL (ref 250.0–450.0)
Transferrin: 258 mg/dL (ref 212.0–360.0)

## 2022-07-29 ENCOUNTER — Other Ambulatory Visit: Payer: Self-pay

## 2022-07-29 ENCOUNTER — Telehealth: Payer: Self-pay

## 2022-07-29 DIAGNOSIS — R7989 Other specified abnormal findings of blood chemistry: Secondary | ICD-10-CM

## 2022-07-29 DIAGNOSIS — R5383 Other fatigue: Secondary | ICD-10-CM

## 2022-07-29 MED ORDER — LEVOTHYROXINE SODIUM 50 MCG PO TABS: 50.0000 ug | ORAL_TABLET | Freq: Every day | ORAL | 2 refills | Status: DC

## 2022-07-29 NOTE — Telephone Encounter (Signed)
Patient called note was read, please call patient. She has questions on TSH medication. Pt was advised to call office to schedule lab appointment 6w form starting medication.

## 2022-07-29 NOTE — Telephone Encounter (Signed)
  Lm for pt to cb  Notify - TSH remains elevated.  Can occur with amiodarone treatment.  Will continue amiodarone.  Will need to treat thyroid.  Start synthroid 78mcg q day.  Recheck TSH in 6 weeks.  Kidney function still decreased, but stable overall.  B12 wnl.  Hgb relatively stable.

## 2022-07-29 NOTE — Telephone Encounter (Signed)
S/w pt - advised of result note and why she needs thyroid medicine. Pt agreeable to start synthroid. Rx sent to pharm Lab appt sched for TSH

## 2022-07-31 ENCOUNTER — Telehealth: Payer: Self-pay | Admitting: Pharmacy Technician

## 2022-07-31 DIAGNOSIS — Z596 Low income: Secondary | ICD-10-CM

## 2022-07-31 NOTE — Progress Notes (Signed)
[facility])                                            [place] [facility]    07/31/2022  Dawn Sherman May 21, 1956 [id number]  Received both patient and provider portion(s) of patient assistance application(s) for [place]. Faxed completed application and required documents into [employer].    Dawn Sherman P. [name], [name]  [phone]

## 2022-08-01 ENCOUNTER — Ambulatory Visit: Payer: Medicare Other | Admitting: Family

## 2022-08-14 ENCOUNTER — Encounter: Payer: Self-pay | Admitting: Internal Medicine

## 2022-08-14 ENCOUNTER — Ambulatory Visit (INDEPENDENT_AMBULATORY_CARE_PROVIDER_SITE_OTHER): Payer: Medicare Other | Admitting: Internal Medicine

## 2022-08-14 VITALS — BP 130/74 | HR 81 | Temp 98.2°F | Resp 18 | Ht 63.0 in | Wt 127.5 lb

## 2022-08-14 DIAGNOSIS — E7800 Pure hypercholesterolemia, unspecified: Secondary | ICD-10-CM | POA: Diagnosis not present

## 2022-08-14 DIAGNOSIS — I5032 Chronic diastolic (congestive) heart failure: Secondary | ICD-10-CM | POA: Diagnosis not present

## 2022-08-14 DIAGNOSIS — I480 Paroxysmal atrial fibrillation: Secondary | ICD-10-CM

## 2022-08-14 DIAGNOSIS — N184 Chronic kidney disease, stage 4 (severe): Secondary | ICD-10-CM | POA: Diagnosis not present

## 2022-08-14 DIAGNOSIS — Z1211 Encounter for screening for malignant neoplasm of colon: Secondary | ICD-10-CM

## 2022-08-14 DIAGNOSIS — J432 Centrilobular emphysema: Secondary | ICD-10-CM

## 2022-08-14 DIAGNOSIS — E785 Hyperlipidemia, unspecified: Secondary | ICD-10-CM

## 2022-08-14 DIAGNOSIS — I10 Essential (primary) hypertension: Secondary | ICD-10-CM

## 2022-08-14 DIAGNOSIS — R1013 Epigastric pain: Secondary | ICD-10-CM | POA: Insufficient documentation

## 2022-08-14 DIAGNOSIS — F320 Major depressive disorder, single episode, mild: Secondary | ICD-10-CM

## 2022-08-14 DIAGNOSIS — D509 Iron deficiency anemia, unspecified: Secondary | ICD-10-CM

## 2022-08-14 DIAGNOSIS — D649 Anemia, unspecified: Secondary | ICD-10-CM

## 2022-08-14 DIAGNOSIS — M799 Soft tissue disorder, unspecified: Secondary | ICD-10-CM

## 2022-08-14 LAB — CBC WITH DIFFERENTIAL/PLATELET
Basophils Absolute: 0.2 10*3/uL — ABNORMAL HIGH (ref 0.0–0.1)
Basophils Relative: 2.6 % (ref 0.0–3.0)
Eosinophils Absolute: 0.1 10*3/uL (ref 0.0–0.7)
Eosinophils Relative: 1.5 % (ref 0.0–5.0)
HCT: 34.2 % — ABNORMAL LOW (ref 36.0–46.0)
Hemoglobin: 11.1 g/dL — ABNORMAL LOW (ref 12.0–15.0)
Lymphocytes Relative: 11.2 % — ABNORMAL LOW (ref 12.0–46.0)
Lymphs Abs: 0.9 10*3/uL (ref 0.7–4.0)
MCHC: 32.3 g/dL (ref 30.0–36.0)
MCV: 88.8 fl (ref 78.0–100.0)
Monocytes Absolute: 0.8 10*3/uL (ref 0.1–1.0)
Monocytes Relative: 10.1 % (ref 3.0–12.0)
Neutro Abs: 5.9 10*3/uL (ref 1.4–7.7)
Neutrophils Relative %: 74.6 % (ref 43.0–77.0)
Platelets: 289 10*3/uL (ref 150.0–400.0)
RBC: 3.85 Mil/uL — ABNORMAL LOW (ref 3.87–5.11)
RDW: 16.8 % — ABNORMAL HIGH (ref 11.5–15.5)
WBC: 7.9 10*3/uL (ref 4.0–10.5)

## 2022-08-14 LAB — BASIC METABOLIC PANEL
BUN: 29 mg/dL — ABNORMAL HIGH (ref 6–23)
CO2: 19 mEq/L (ref 19–32)
Calcium: 8.8 mg/dL (ref 8.4–10.5)
Chloride: 107 mEq/L (ref 96–112)
Creatinine, Ser: 2.84 mg/dL — ABNORMAL HIGH (ref 0.40–1.20)
GFR: 16.7 mL/min — ABNORMAL LOW (ref >60.00)
Glucose, Bld: 77 mg/dL (ref 70–99)
Potassium: 4.4 mEq/L (ref 3.5–5.1)
Sodium: 137 mEq/L (ref 135–145)

## 2022-08-14 LAB — LIPID PANEL
Cholesterol: 178 mg/dL (ref 0–200)
HDL: 90.7 mg/dL (ref >39.00)
LDL Cholesterol: 76 mg/dL (ref 0–99)
NonHDL: 87.78
Total CHOL/HDL Ratio: 2
Triglycerides: 57 mg/dL (ref 0.0–149.0)
VLDL: 11.4 mg/dL (ref 0.0–40.0)

## 2022-08-14 LAB — HEPATIC FUNCTION PANEL
ALT: 20 U/L (ref 0–35)
AST: 25 U/L (ref 0–37)
Albumin: 4.1 g/dL (ref 3.5–5.2)
Alkaline Phosphatase: 109 U/L (ref 39–117)
Bilirubin, Direct: 0.1 mg/dL (ref 0.0–0.3)
Total Bilirubin: 0.3 mg/dL (ref 0.2–1.2)
Total Protein: 6.3 g/dL (ref 6.0–8.3)

## 2022-08-14 LAB — LIPASE: Lipase: 8 U/L — ABNORMAL LOW (ref 11.0–59.0)

## 2022-08-14 LAB — FERRITIN: Ferritin: 11.4 ng/mL (ref 10.0–291.0)

## 2022-08-14 NOTE — Progress Notes (Signed)
Subjective:    Patient ID: [name], female    DOB: [birth year], 66 y.o.   MRN: [id number]  Patient here for  Chief Complaint  Patient presents with   Medical Management of Chronic Issues    HPI Here to follow up regarding afib, CHF and COPD.  Was hospitalized 06/2022 - after presentint with sob.  Amiodarone added.  Diuresed.  Discharged on oral lasix.  Treated initially with abx and steroids.  Has continued on coreg and eliquis.  Reports she is doing relatively well.  Still with some DOE.  Weighing daily.  Taking lasix on average qod.  Weight at home staying approximately 125 pounds.  No chest pain.  No increased cough or congestion reported. Does report some epigastric pain.  Concerned regarding increased tissue - surrounding right ey - beneath right eye.  Request referral.     Past Medical History:  Diagnosis Date   B12 deficiency    CHF (congestive heart failure) (HCC)    CKD (chronic kidney disease), stage III (HCC)    COPD (chronic obstructive pulmonary disease) (HCC)    Depression    Endometriosis    Hypertension    Mixed hyperlipidemia    Tobacco abuse    Past Surgical History:  Procedure Laterality Date   ABDOMINAL HYSTERECTOMY     BREAST BIOPSY Right 2015   neg-  FIBROADENOMA   TAH/RSO  1999   secondary to bleeding and endometriosis (Dr Vernie Ammons)   Family History  Problem Relation Age of Onset   Hypercholesterolemia Mother    Rheum arthritis Father    Rheum arthritis Daughter    Fibromyalgia Daughter    Breast cancer Neg Hx    Colon cancer Neg Hx    Social History   Socioeconomic History   Marital status: Married    Spouse name: Not on file   Number of children: Not on file   Years of education: Not on file   Highest education level: Not on file  Occupational History   Not on file  Tobacco Use   Smoking status: Every Day    Packs/day: 0.25    Years: 40.00    Total pack years: 10.00    Types: Cigarettes   Smokeless tobacco: Never  Vaping Use    Vaping Use: Never used  Substance and Sexual Activity   Alcohol use: Not Currently    Alcohol/week: 0.0 standard drinks of alcohol    Comment: occasional   Drug use: No   Sexual activity: Not Currently  Other Topics Concern   Not on file  Social History Narrative   Lives in [place] with her husband.  She is retired from [employer].  She does not routinely exercise.   Social Determinants of Health   Financial Resource Strain: Low Risk  (11/27/2021)   Overall Financial Resource Strain (CARDIA)    Difficulty of Paying Living Expenses: Not hard at all  Food Insecurity: No Food Insecurity (07/12/2022)   Hunger Vital Sign    Worried About Running Out of Food in the Last Year: Never true    Ran Out of Food in the Last Year: Never true  Transportation Needs: No Transportation Needs (07/12/2022)   PRAPARE - Hydrologist (Medical): No    Lack of Transportation (Non-Medical): No  Physical Activity: Sufficiently Active (11/27/2021)   Exercise Vital Sign    Days of Exercise per Week: 7 days    Minutes of Exercise per Session: 30  min  Stress: No Stress Concern Present (11/27/2021)   [place]    Feeling of Stress : Not at all  Social Connections: Unknown (11/27/2021)   Social Connection and Isolation Panel [NHANES]    Frequency of Communication with Friends and Family: Not on file    Frequency of Social Gatherings with Friends and Family: Not on file    Attends Religious Services: Not on file    Active Member of Clubs or Organizations: Not on file    Attends Archivist Meetings: Not on file    Marital Status: Married     Review of Systems  Constitutional:  Negative for appetite change and unexpected weight change.  HENT:  Negative for congestion and sinus pressure.   Respiratory:  Negative for cough and chest tightness.        Some DOE.   Cardiovascular:  Negative for  chest pain and palpitations.       No increased lower extremity swelling.   Gastrointestinal:  Negative for nausea and vomiting.       Epigastric pain as outlined.   Genitourinary:  Negative for difficulty urinating and dysuria.  Musculoskeletal:  Negative for joint swelling and myalgias.  Skin:  Negative for color change and rash.  Neurological:  Negative for dizziness and headaches.  Psychiatric/Behavioral:  Negative for agitation and dysphoric mood.        Objective:     BP 130/74 (BP Location: Left Arm, Patient Position: Sitting, Cuff Size: Small)   Pulse 81   Temp 98.2 F (36.8 C) (Temporal)   Resp 18   Ht _0  (1.6 m)   Wt 127 lb 8 oz (57.8 kg)   SpO2 91%   BMI 22.59 kg/m  Wt Readings from Last 3 Encounters:  08/14/22 127 lb 8 oz (67 kg)  07/22/22 127 lb 8 oz (57.8 kg)  07/16/22 129 lb 9.6 oz (58.8 kg)    Physical Exam Vitals reviewed.  Constitutional:      General: She is not in acute distress.    Appearance: Normal appearance.  HENT:     Head: Normocephalic and atraumatic.     Right Ear: External ear normal.     Left Ear: External ear normal.  Eyes:     General: No scleral icterus.       Right eye: No discharge.        Left eye: No discharge.     Conjunctiva/sclera: Conjunctivae normal.  Neck:     Thyroid: No thyromegaly.  Cardiovascular:     Rate and Rhythm: Normal rate and regular rhythm.  Pulmonary:     Effort: No respiratory distress.     Breath sounds: Normal breath sounds. No wheezing.  Abdominal:     General: Bowel sounds are normal.     Palpations: Abdomen is soft.     Tenderness: There is no abdominal tenderness.  Musculoskeletal:        General: No swelling or tenderness.     Cervical back: Neck supple. No tenderness.  Lymphadenopathy:     Cervical: No cervical adenopathy.  Skin:    Findings: No erythema or rash.  Neurological:     Mental Status: She is alert.  Psychiatric:        Mood and Affect: Mood normal.        Behavior:  Behavior normal.      Outpatient Encounter Medications as of 08/14/2022  Medication Sig   albuterol (VENTOLIN HFA)  108 (90 Base) MCG/ACT inhaler Inhale 2 puffs into the lungs every 6 (six) hours as needed for wheezing or shortness of breath.   amiodarone (PACERONE) 200 MG tablet Take 2 tablets twice a day for 6 more days then 1 tablet twice a day for 7 days then 1 tablet daily   apixaban (ELIQUIS) 2.5 MG TABS tablet Take 1 tablet (2.5 mg total) by mouth 2 (two) times daily.   atorvastatin (LIPITOR) 40 MG tablet TAKE ONE TABLET BY MOUTH EVERY DAY   buPROPion (WELLBUTRIN XL) 150 MG 24 hr tablet TAKE ONE TABLET BY MOUTH EVERY DAY   calcium carbonate (OS-CAL - DOSED IN MG OF ELEMENTAL CALCIUM) 1250 (500 Ca) MG tablet Take 1 tablet (500 mg of elemental calcium total) by mouth 3 (three) times daily with meals.   diltiazem (CARDIZEM CD) 120 MG 24 hr capsule Take 1 capsule (120 mg total) by mouth daily.   FARXIGA 10 MG TABS tablet Take 10 mg by mouth daily.   ferrous sulfate 325 (65 FE) MG tablet Take 1 tablet (325 mg total) by mouth 2 (two) times daily with a meal.   Fluticasone-Umeclidin-Vilant (TRELEGY ELLIPTA) 100-62.5-25 MCG/ACT AEPB Inhale 1 puff into the lungs daily.   furosemide (LASIX) 40 MG tablet Take 1 tablet (40 mg total) by mouth daily. Take 40 mg daily and and extra 40 mg as needed for fluid retention   hydrALAZINE (APRESOLINE) 25 MG tablet Take 1 tablet (25 mg total) by mouth 3 (three) times daily.   levothyroxine (SYNTHROID) 50 MCG tablet Take 1 tablet (50 mcg total) by mouth daily.   loratadine (CLARITIN) 10 MG tablet Take 10 mg by mouth daily.   omeprazole (PRILOSEC) 20 MG capsule Take 20 mg by mouth daily.   venlafaxine XR (EFFEXOR-XR) 150 MG 24 hr capsule TAKE ONE CAPSULE BY MOUTH EVERY DAY   No facility-administered encounter medications on file as of 08/14/2022.     Lab Results  Component Value Date   WBC 7.9 08/14/2022   HGB 11.1 (L) 08/14/2022   HCT 34.2 (L) 08/14/2022    PLT 289.0 08/14/2022   GLUCOSE 77 08/14/2022   CHOL 178 08/14/2022   TRIG 57.0 08/14/2022   HDL 90.70 08/14/2022   LDLDIRECT 53.3 10/14/2012   LDLCALC 76 08/14/2022   ALT 20 08/14/2022   AST 25 08/14/2022   NA 137 08/14/2022   K 4.4 08/14/2022   CL 107 08/14/2022   CREATININE 2.84 (H) 08/14/2022   BUN 29 (H) 08/14/2022   CO2 19 08/14/2022   TSH 12.62 (H) 07/26/2022   INR 0.9 12/10/2021   HGBA1C 6.0 02/28/2022    ECHOCARDIOGRAM COMPLETE  Result Date: 07/01/2022    ECHOCARDIOGRAM REPORT   Patient Name:   MIKHAILA ROH Date of Exam: 07/01/2022 Medical Rec #:  [id number]     Height:       65.0 in Accession #:    [id number]    Weight:       120.0 lb Date of Birth:  21-Feb-1956    BSA:          1.592 m Patient Age:    34 years      BP:           137/66 mmHg Patient Gender: F             HR:           99 bpm. Exam Location:  [facility] Procedure: 2D Echo, Color Doppler, Cardiac Doppler and Strain Analysis Indications:  R06.00 Dyspnea  History:         Patient has prior history of Echocardiogram examinations, most                  recent 12/11/2021. CHF, CKD and COPD; Risk Factors:Hypertension,                  Dyslipidemia and Current Smoker.  Sonographer:     Charmayne Sheer Referring Phys:  [id number] Rise Mu Diagnosing Phys: Kathlyn Sacramento MD  Sonographer Comments: Global longitudinal strain was attempted. IMPRESSIONS  1. Left ventricular ejection fraction, by estimation, is 50 to 55%. The left ventricle has low normal function. The left ventricle has no regional wall motion abnormalities. The left ventricular internal cavity size was mildly dilated. Left ventricular diastolic parameters are consistent with Grade II diastolic dysfunction (pseudonormalization).  2. Right ventricular systolic function is normal. The right ventricular size is normal.  3. Left atrial size was mildly dilated.  4. The mitral valve is normal in structure. Moderate mitral valve regurgitation. No evidence of mitral stenosis.  5.  The aortic valve is normal in structure. Aortic valve regurgitation is not visualized. Mild aortic valve stenosis. Aortic valve area, by VTI measures 1.92 cm. Aortic valve mean gradient measures 8.0 mmHg.  6. The inferior vena cava is normal in size with greater than 50% respiratory variability, suggesting right atrial pressure of 3 mmHg. FINDINGS  Left Ventricle: Left ventricular ejection fraction, by estimation, is 50 to 55%. The left ventricle has low normal function. The left ventricle has no regional wall motion abnormalities. Global longitudinal strain performed but not reported based on interpreter judgement due to suboptimal tracking. The left ventricular internal cavity size was mildly dilated. There is no left ventricular hypertrophy. Left ventricular diastolic parameters are consistent with Grade II diastolic dysfunction (pseudonormalization). Right Ventricle: The right ventricular size is normal. No increase in right ventricular wall thickness. Right ventricular systolic function is normal. Left Atrium: Left atrial size was mildly dilated. Right Atrium: Right atrial size was normal in size. Pericardium: There is no evidence of pericardial effusion. Mitral Valve: The mitral valve is normal in structure. There is moderate thickening of the mitral valve leaflet(s). There is mild calcification of the mitral valve leaflet(s). Moderate mitral valve regurgitation. No evidence of mitral valve stenosis. Tricuspid Valve: The tricuspid valve is normal in structure. Tricuspid valve regurgitation is not demonstrated. No evidence of tricuspid stenosis. Aortic Valve: The aortic valve is normal in structure. Aortic valve regurgitation is not visualized. Mild aortic stenosis is present. Aortic valve mean gradient measures 8.0 mmHg. Aortic valve peak gradient measures 12.8 mmHg. Aortic valve area, by VTI measures 1.92 cm. Pulmonic Valve: The pulmonic valve was normal in structure. Pulmonic valve regurgitation is not  visualized. No evidence of pulmonic stenosis. Aorta: The aortic root is normal in size and structure. Venous: The inferior vena cava is normal in size with greater than 50% respiratory variability, suggesting right atrial pressure of 3 mmHg. IAS/Shunts: No atrial level shunt detected by color flow Doppler.  LEFT VENTRICLE PLAX 2D LVIDd:         5.20 cm   Diastology LVIDs:         4.00 cm   LV e' medial:    7.18 cm/s LV PW:         1.10 cm   LV E/e' medial:  21.4 LV IVS:        0.70 cm   LV e' lateral:   12.80  cm/s LVOT diam:     1.80 cm   LV E/e' lateral: 12.0 LV SV:         63 LV SV Index:   40 LVOT Area:     2.54 cm  RIGHT VENTRICLE RV Basal diam:  3.70 cm RV S prime:     13.20 cm/s LEFT ATRIUM             Index        RIGHT ATRIUM           Index LA diam:        5.10 cm 3.20 cm/m   RA Area:     15.40 cm LA Vol (A2C):   62.6 ml 39.32 ml/m  RA Volume:   39.30 ml  24.68 ml/m LA Vol (A4C):   52.4 ml 32.91 ml/m LA Biplane Vol: 58.6 ml 36.81 ml/m  AORTIC VALVE                     PULMONIC VALVE AV Area (Vmax):    1.82 cm      PV Vmax:       1.44 m/s AV Area (Vmean):   1.65 cm      PV Vmean:      102.000 cm/s AV Area (VTI):     1.92 cm      PV VTI:        0.258 m AV Vmax:           179.00 cm/s   PV Peak grad:  8.3 mmHg AV Vmean:          136.000 cm/s  PV Mean grad:  5.0 mmHg AV VTI:            0.329 m AV Peak Grad:      12.8 mmHg AV Mean Grad:      8.0 mmHg LVOT Vmax:         128.00 cm/s LVOT Vmean:        88.300 cm/s LVOT VTI:          0.248 m LVOT/AV VTI ratio: 0.75  AORTA Ao Root diam: 3.10 cm MITRAL VALVE MV Area (PHT): 6.32 cm     SHUNTS MV Decel Time: 120 msec     Systemic VTI:  0.25 m MV E velocity: 154.00 cm/s  Systemic Diam: 1.80 cm MV A velocity: 143.00 cm/s MV E/A ratio:  1.08 Kathlyn Sacramento MD Electronically signed by Kathlyn Sacramento MD Signature Date/Time: 07/01/2022/3:03:52 PM    Final    NM Pulmonary Perfusion  Result Date: 07/01/2022 CLINICAL DATA:  PE suspected, positive D-dimer EXAM:  NUCLEAR MEDICINE PERFUSION LUNG SCAN TECHNIQUE: Perfusion images were obtained in multiple projections after intravenous injection of radiopharmaceutical. Ventilation scans intentionally deferred if perfusion scan and chest x-ray adequate for interpretation during COVID 19 epidemic. RADIOPHARMACEUTICALS:  4.43 mCi Tc-73mMAA IV COMPARISON:  Chest radiograph, 06/30/2022 FINDINGS: Normal, homogeneous perfusion of the lungs. No suspicious perfusion defect. Cardiomegaly. IMPRESSION: 1. Very low probability for pulmonary embolism by modified perfusion only PIOPED criteria (PE absent). 2.  Cardiomegaly. Electronically Signed   By: ADelanna AhmadiM.D.   On: 07/01/2022 10:50       Assessment & Plan:  Chronic diastolic CHF (congestive heart failure) ([place] Assessment & Plan: Continue farxiga.  Taking lasix qod.  Weight daily.  Plan f/u with cardiology. Check metabolic panel.    CKD (chronic kidney disease), stage IV (HCC) Assessment & Plan: Avoid antiinflammatories.  Will need to monitor  kidney function - with lasix use.   Recheck met b today.    Essential hypertension Assessment & Plan: Continue diltiazem and hydralazine.  Adjust lasix as outlined. Follow pressures.  Follow metabolic panel.   Orders: -     Basic metabolic panel  Hypercholesterolemia Assessment & Plan: Continue lipitor.  Low cholesterol diet and exercise.  Follow lipid panel and liver function tests.    Orders: -     Hepatic function panel -     Lipid panel  Iron deficiency anemia, unspecified iron deficiency anemia type -     CBC with Differential/Platelet -     Ferritin  Epigastric pain Assessment & Plan: Unclear etiology. On omeprazole.  Check labs.  Check abdominal ultrasound.  Further w/up pending results.   Orders: -     Lipase -     US Abdomen Complete; Future  Anemia, unspecified type Assessment & Plan: Follow cbc.    Centrilobular emphysema ([place]) Assessment & Plan: No increased cough or congestion.   Some DOE.  Continue lasix.  Follow.     Depression, major, single episode, mild ([place]) Assessment & Plan: Continue effexor and wellbutrin. Has good support.  Follow.    Dyslipidemia Assessment & Plan: Continue statin therapy. Atorvastatin.    Encounter for screening colonoscopy Assessment & Plan: Colonoscopy 12/2013.  Recommended f/u in 10 years.    Primary hypertension Assessment & Plan: Continue diltiazem and hydralazine.  Follow pressures.  Follow metabolic panel.    Paroxysmal atrial fibrillation [facility]) Assessment & Plan: On amiodarone.  Continue eliquis.  In SR today.  Follow.    Soft tissue fullness of periorbital region Assessment & Plan: Increased fullness beneath eye.  Request referral for evaluation.  Refer to Dr Nadeen Landau.   Orders: -     Ambulatory referral to ENT     [name], MD

## 2022-08-25 ENCOUNTER — Encounter: Payer: Self-pay | Admitting: Internal Medicine

## 2022-08-25 DIAGNOSIS — M799 Soft tissue disorder, unspecified: Secondary | ICD-10-CM | POA: Insufficient documentation

## 2022-08-25 NOTE — Assessment & Plan Note (Signed)
Increased fullness beneath eye.  Request referral for evaluation.  Refer to Dr Nadeen Landau.

## 2022-08-25 NOTE — Assessment & Plan Note (Signed)
Continue diltiazem and hydralazine.  Follow pressures.  Follow metabolic panel.

## 2022-08-25 NOTE — Assessment & Plan Note (Signed)
No increased cough or congestion.  Some DOE.  Continue lasix.  Follow.

## 2022-08-25 NOTE — Assessment & Plan Note (Addendum)
Continue diltiazem and hydralazine.  Adjust lasix as outlined. Follow pressures.  Follow metabolic panel.

## 2022-08-25 NOTE — Assessment & Plan Note (Signed)
On amiodarone.  Continue eliquis.  In SR today.  Follow.

## 2022-08-25 NOTE — Assessment & Plan Note (Signed)
Avoid antiinflammatories.  Will need to monitor kidney function - with lasix use.   Recheck met b today.

## 2022-08-25 NOTE — Assessment & Plan Note (Signed)
Continue statin therapy. Atorvastatin.

## 2022-08-25 NOTE — Assessment & Plan Note (Signed)
Colonoscopy 12/2013.  Recommended f/u in 10 years.

## 2022-08-25 NOTE — Assessment & Plan Note (Signed)
Unclear etiology. On omeprazole.  Check labs.  Check abdominal ultrasound.  Further w/up pending results.

## 2022-08-25 NOTE — Assessment & Plan Note (Signed)
Continue effexor and wellbutrin. Has good support.  Follow.

## 2022-08-25 NOTE — Assessment & Plan Note (Signed)
Follow cbc.  

## 2022-08-25 NOTE — Assessment & Plan Note (Signed)
Continue farxiga.  Taking lasix qod.  Weight daily.  Plan f/u with cardiology. Check metabolic panel.

## 2022-08-25 NOTE — Assessment & Plan Note (Signed)
Continue lipitor.  Low cholesterol diet and exercise.  Follow lipid panel and liver function tests.   

## 2022-08-30 ENCOUNTER — Ambulatory Visit
Admission: RE | Admit: 2022-08-30 | Discharge: 2022-08-30 | Disposition: A | Discharge status: Home / Self Care | Payer: Medicare Other | Source: Ambulatory Visit | Attending: Internal Medicine | Admitting: Internal Medicine

## 2022-08-30 DIAGNOSIS — R1013 Epigastric pain: Secondary | ICD-10-CM | POA: Diagnosis not present

## 2022-09-02 ENCOUNTER — Other Ambulatory Visit: Payer: Self-pay | Admitting: Internal Medicine

## 2022-09-06 ENCOUNTER — Telehealth: Payer: Self-pay | Admitting: Pharmacy Technician

## 2022-09-06 DIAGNOSIS — Z596 Low income: Secondary | ICD-10-CM

## 2022-09-06 NOTE — Progress Notes (Signed)
[place] [facility])                                            [place] Team    09/06/2022  Dawn Sherman Apr 23, 1956 [id number]  Care coordination call placed to [employer] in regard to Dawn Sherman application.  Spoke to [place] who informs patient is APPROVED 08/26/22-08/26/23.Medication will auto ship based on last fill date in 2023 and be delivered to the patient's home.  Dawn Sherman P. Dawn Sherman, [place]  [phone]

## 2022-09-07 ENCOUNTER — Inpatient Hospital Stay: Payer: Medicare Other

## 2022-09-07 ENCOUNTER — Inpatient Hospital Stay
Admission: EM | Admit: 2022-09-07 | Discharge: 2022-09-12 | DRG: 291 | Disposition: A | Discharge status: Home / Self Care | Payer: Medicare Other | Attending: Family Medicine | Admitting: Family Medicine

## 2022-09-07 ENCOUNTER — Emergency Department: Payer: Medicare Other

## 2022-09-07 ENCOUNTER — Other Ambulatory Visit: Payer: Self-pay

## 2022-09-07 DIAGNOSIS — T502X5A Adverse effect of carbonic-anhydrase inhibitors, benzothiadiazides and other diuretics, initial encounter: Secondary | ICD-10-CM | POA: Diagnosis present

## 2022-09-07 DIAGNOSIS — D631 Anemia in chronic kidney disease: Secondary | ICD-10-CM | POA: Diagnosis present

## 2022-09-07 DIAGNOSIS — Z8261 Family history of arthritis: Secondary | ICD-10-CM

## 2022-09-07 DIAGNOSIS — J441 Chronic obstructive pulmonary disease with (acute) exacerbation: Secondary | ICD-10-CM | POA: Diagnosis present

## 2022-09-07 DIAGNOSIS — Z888 Allergy status to other drugs, medicaments and biological substances status: Secondary | ICD-10-CM | POA: Diagnosis not present

## 2022-09-07 DIAGNOSIS — R531 Weakness: Secondary | ICD-10-CM

## 2022-09-07 DIAGNOSIS — F32A Depression, unspecified: Secondary | ICD-10-CM | POA: Diagnosis present

## 2022-09-07 DIAGNOSIS — R0600 Dyspnea, unspecified: Secondary | ICD-10-CM | POA: Diagnosis present

## 2022-09-07 DIAGNOSIS — F17210 Nicotine dependence, cigarettes, uncomplicated: Secondary | ICD-10-CM | POA: Diagnosis present

## 2022-09-07 DIAGNOSIS — Z7901 Long term (current) use of anticoagulants: Secondary | ICD-10-CM

## 2022-09-07 DIAGNOSIS — N179 Acute kidney failure, unspecified: Secondary | ICD-10-CM | POA: Diagnosis present

## 2022-09-07 DIAGNOSIS — Z79899 Other long term (current) drug therapy: Secondary | ICD-10-CM

## 2022-09-07 DIAGNOSIS — E782 Mixed hyperlipidemia: Secondary | ICD-10-CM | POA: Diagnosis present

## 2022-09-07 DIAGNOSIS — I4819 Other persistent atrial fibrillation: Secondary | ICD-10-CM | POA: Diagnosis present

## 2022-09-07 DIAGNOSIS — I5043 Acute on chronic combined systolic (congestive) and diastolic (congestive) heart failure: Secondary | ICD-10-CM | POA: Diagnosis present

## 2022-09-07 DIAGNOSIS — D649 Anemia, unspecified: Secondary | ICD-10-CM | POA: Diagnosis present

## 2022-09-07 DIAGNOSIS — R0602 Shortness of breath: Principal | ICD-10-CM | POA: Diagnosis present

## 2022-09-07 DIAGNOSIS — R7989 Other specified abnormal findings of blood chemistry: Secondary | ICD-10-CM | POA: Insufficient documentation

## 2022-09-07 DIAGNOSIS — Z880 Allergy status to penicillin: Secondary | ICD-10-CM

## 2022-09-07 DIAGNOSIS — Z79890 Hormone replacement therapy (postmenopausal): Secondary | ICD-10-CM | POA: Diagnosis not present

## 2022-09-07 DIAGNOSIS — I130 Hypertensive heart and chronic kidney disease with heart failure and stage 1 through stage 4 chronic kidney disease, or unspecified chronic kidney disease: Secondary | ICD-10-CM | POA: Diagnosis present

## 2022-09-07 DIAGNOSIS — I5030 Unspecified diastolic (congestive) heart failure: Secondary | ICD-10-CM | POA: Insufficient documentation

## 2022-09-07 DIAGNOSIS — Z1152 Encounter for screening for COVID-19: Secondary | ICD-10-CM

## 2022-09-07 DIAGNOSIS — I509 Heart failure, unspecified: Secondary | ICD-10-CM

## 2022-09-07 DIAGNOSIS — E039 Hypothyroidism, unspecified: Secondary | ICD-10-CM | POA: Diagnosis present

## 2022-09-07 DIAGNOSIS — Z720 Tobacco use: Secondary | ICD-10-CM | POA: Diagnosis present

## 2022-09-07 DIAGNOSIS — F1010 Alcohol abuse, uncomplicated: Secondary | ICD-10-CM | POA: Diagnosis present

## 2022-09-07 DIAGNOSIS — N184 Chronic kidney disease, stage 4 (severe): Secondary | ICD-10-CM | POA: Diagnosis present

## 2022-09-07 DIAGNOSIS — F1011 Alcohol abuse, in remission: Secondary | ICD-10-CM | POA: Diagnosis present

## 2022-09-07 DIAGNOSIS — F418 Other specified anxiety disorders: Secondary | ICD-10-CM | POA: Insufficient documentation

## 2022-09-07 DIAGNOSIS — D72829 Elevated white blood cell count, unspecified: Secondary | ICD-10-CM | POA: Diagnosis present

## 2022-09-07 DIAGNOSIS — Z885 Allergy status to narcotic agent status: Secondary | ICD-10-CM | POA: Diagnosis not present

## 2022-09-07 DIAGNOSIS — M7989 Other specified soft tissue disorders: Secondary | ICD-10-CM | POA: Diagnosis present

## 2022-09-07 DIAGNOSIS — I10 Essential (primary) hypertension: Secondary | ICD-10-CM | POA: Diagnosis present

## 2022-09-07 DIAGNOSIS — F419 Anxiety disorder, unspecified: Secondary | ICD-10-CM | POA: Diagnosis present

## 2022-09-07 DIAGNOSIS — J9601 Acute respiratory failure with hypoxia: Secondary | ICD-10-CM | POA: Diagnosis present

## 2022-09-07 DIAGNOSIS — K219 Gastro-esophageal reflux disease without esophagitis: Secondary | ICD-10-CM | POA: Diagnosis present

## 2022-09-07 DIAGNOSIS — Z7951 Long term (current) use of inhaled steroids: Secondary | ICD-10-CM

## 2022-09-07 DIAGNOSIS — E785 Hyperlipidemia, unspecified: Secondary | ICD-10-CM | POA: Diagnosis present

## 2022-09-07 DIAGNOSIS — Z83438 Family history of other disorder of lipoprotein metabolism and other lipidemia: Secondary | ICD-10-CM | POA: Diagnosis not present

## 2022-09-07 DIAGNOSIS — R0902 Hypoxemia: Secondary | ICD-10-CM

## 2022-09-07 LAB — CBC WITH DIFFERENTIAL/PLATELET
Abs Immature Granulocytes: 0.04 10*3/uL (ref 0.00–0.07)
Basophils Absolute: 0.1 10*3/uL (ref 0.0–0.1)
Basophils Relative: 1 %
Eosinophils Absolute: 0.1 10*3/uL (ref 0.0–0.5)
Eosinophils Relative: 1 %
HCT: 35.1 % — ABNORMAL LOW (ref 36.0–46.0)
Hemoglobin: 10.7 g/dL — ABNORMAL LOW (ref 12.0–15.0)
Immature Granulocytes: 0 %
Lymphocytes Relative: 9 %
Lymphs Abs: 0.9 10*3/uL (ref 0.7–4.0)
MCH: 26.7 pg (ref 26.0–34.0)
MCHC: 30.5 g/dL (ref 30.0–36.0)
MCV: 87.5 fL (ref 80.0–100.0)
Monocytes Absolute: 1.3 10*3/uL — ABNORMAL HIGH (ref 0.1–1.0)
Monocytes Relative: 12 %
Neutro Abs: 8.5 10*3/uL — ABNORMAL HIGH (ref 1.7–7.7)
Neutrophils Relative %: 77 %
Platelets: 366 10*3/uL (ref 150–400)
RBC: 4.01 MIL/uL (ref 3.87–5.11)
RDW: 16.1 % — ABNORMAL HIGH (ref 11.5–15.5)
Smear Review: NORMAL
WBC: 10.8 10*3/uL — ABNORMAL HIGH (ref 4.0–10.5)
nRBC: 0 % (ref 0.0–0.2)

## 2022-09-07 LAB — COMPREHENSIVE METABOLIC PANEL
ALT: 24 U/L (ref 0–44)
AST: 29 U/L (ref 15–41)
Albumin: 4 g/dL (ref 3.5–5.0)
Alkaline Phosphatase: 144 U/L — ABNORMAL HIGH (ref 38–126)
Anion gap: 11 (ref 5–15)
BUN: 35 mg/dL — ABNORMAL HIGH (ref 8–23)
CO2: 22 mmol/L (ref 22–32)
Calcium: 9.2 mg/dL (ref 8.9–10.3)
Chloride: 101 mmol/L (ref 98–111)
Creatinine, Ser: 2.7 mg/dL — ABNORMAL HIGH (ref 0.44–1.00)
GFR, Estimated: 19 mL/min — ABNORMAL LOW (ref >60)
Glucose, Bld: 82 mg/dL (ref 70–99)
Potassium: 5.1 mmol/L (ref 3.5–5.1)
Sodium: 134 mmol/L — ABNORMAL LOW (ref 135–145)
Total Bilirubin: 0.6 mg/dL (ref 0.3–1.2)
Total Protein: 7.4 g/dL (ref 6.5–8.1)

## 2022-09-07 LAB — D-DIMER, QUANTITATIVE: D-Dimer, Quant: 5.96 ug/mL-FEU — ABNORMAL HIGH (ref 0.00–0.50)

## 2022-09-07 LAB — RESP PANEL BY RT-PCR (RSV, FLU A&B, COVID)  RVPGX2
Influenza A by PCR: NEGATIVE
Influenza B by PCR: NEGATIVE
Resp Syncytial Virus by PCR: NEGATIVE
SARS Coronavirus 2 by RT PCR: NEGATIVE

## 2022-09-07 LAB — TROPONIN I (HIGH SENSITIVITY)
Troponin I (High Sensitivity): 14 ng/L (ref <18)
Troponin I (High Sensitivity): 14 ng/L (ref <18)

## 2022-09-07 LAB — PROCALCITONIN: Procalcitonin: <0.1 ng/mL

## 2022-09-07 LAB — BRAIN NATRIURETIC PEPTIDE: B Natriuretic Peptide: 4096.6 pg/mL — ABNORMAL HIGH (ref 0.0–100.0)

## 2022-09-07 MED ORDER — UMECLIDINIUM BROMIDE 62.5 MCG/ACT IN AEPB
1.0000 | INHALATION_SPRAY | Freq: Every day | RESPIRATORY_TRACT | Status: DC
  Administered 2022-09-08 – 2022-09-12 (×5): 1 via RESPIRATORY_TRACT
  Filled 2022-09-07 (×2): qty 7

## 2022-09-07 MED ORDER — LEVOTHYROXINE SODIUM 50 MCG PO TABS
50.0000 ug | ORAL_TABLET | Freq: Every day | ORAL | Status: DC
  Administered 2022-09-08 – 2022-09-12 (×5): 50 ug via ORAL
  Filled 2022-09-07 (×5): qty 1

## 2022-09-07 MED ORDER — AMIODARONE HCL 200 MG PO TABS
200.0000 mg | ORAL_TABLET | Freq: Every day | ORAL | Status: DC
  Administered 2022-09-08 – 2022-09-12 (×5): 200 mg via ORAL
  Filled 2022-09-07 (×5): qty 1

## 2022-09-07 MED ORDER — ACETAMINOPHEN 325 MG PO TABS: 650.0000 mg | ORAL_TABLET | Freq: Four times a day (QID) | ORAL | Status: DC | PRN

## 2022-09-07 MED ORDER — NICOTINE 14 MG/24HR TD PT24: 14.0000 mg | MEDICATED_PATCH | Freq: Every day | TRANSDERMAL | Status: DC | PRN

## 2022-09-07 MED ORDER — SENNOSIDES-DOCUSATE SODIUM 8.6-50 MG PO TABS: 1.0000 | ORAL_TABLET | Freq: Every evening | ORAL | Status: DC | PRN

## 2022-09-07 MED ORDER — HEPARIN SODIUM (PORCINE) 5000 UNIT/ML IJ SOLN: 5000.0000 [IU] | Freq: Three times a day (TID) | INTRAMUSCULAR | Status: DC

## 2022-09-07 MED ORDER — VENLAFAXINE HCL ER 75 MG PO CP24
150.0000 mg | ORAL_CAPSULE | Freq: Every day | ORAL | Status: DC
  Administered 2022-09-08 – 2022-09-12 (×5): 150 mg via ORAL
  Filled 2022-09-07 (×5): qty 2

## 2022-09-07 MED ORDER — ATORVASTATIN CALCIUM 20 MG PO TABS
40.0000 mg | ORAL_TABLET | Freq: Every day | ORAL | Status: DC
  Administered 2022-09-08 – 2022-09-12 (×5): 40 mg via ORAL
  Filled 2022-09-07 (×5): qty 2

## 2022-09-07 MED ORDER — DILTIAZEM HCL ER COATED BEADS 120 MG PO CP24
120.0000 mg | ORAL_CAPSULE | Freq: Every day | ORAL | Status: DC
  Administered 2022-09-08 – 2022-09-12 (×5): 120 mg via ORAL
  Filled 2022-09-07 (×5): qty 1

## 2022-09-07 MED ORDER — HYDRALAZINE HCL 20 MG/ML IJ SOLN: 5.0000 mg | Freq: Three times a day (TID) | INTRAMUSCULAR | Status: AC | PRN

## 2022-09-07 MED ORDER — BUPROPION HCL ER (XL) 150 MG PO TB24
150.0000 mg | ORAL_TABLET | Freq: Every day | ORAL | Status: DC
  Administered 2022-09-08 – 2022-09-12 (×5): 150 mg via ORAL
  Filled 2022-09-07 (×5): qty 1

## 2022-09-07 MED ORDER — LABETALOL HCL 5 MG/ML IV SOLN: 5.0000 mg | INTRAVENOUS | Status: DC | PRN

## 2022-09-07 MED ORDER — ONDANSETRON HCL 4 MG PO TABS: 4.0000 mg | ORAL_TABLET | Freq: Four times a day (QID) | ORAL | Status: DC | PRN

## 2022-09-07 MED ORDER — FLUTICASONE FUROATE-VILANTEROL 100-25 MCG/ACT IN AEPB
1.0000 | INHALATION_SPRAY | Freq: Every day | RESPIRATORY_TRACT | Status: DC
  Administered 2022-09-08 – 2022-09-12 (×5): 1 via RESPIRATORY_TRACT
  Filled 2022-09-07 (×2): qty 28

## 2022-09-07 MED ORDER — TECHNETIUM TO 99M ALBUMIN AGGREGATED
5.0000 | Freq: Once | INTRAVENOUS | Status: AC | PRN
  Administered 2022-09-07: 4.3 via INTRAVENOUS

## 2022-09-07 MED ORDER — HYDRALAZINE HCL 25 MG PO TABS
25.0000 mg | ORAL_TABLET | Freq: Three times a day (TID) | ORAL | Status: DC
  Administered 2022-09-07 – 2022-09-12 (×14): 25 mg via ORAL
  Filled 2022-09-07 (×15): qty 1

## 2022-09-07 MED ORDER — PANTOPRAZOLE SODIUM 40 MG PO TBEC
40.0000 mg | DELAYED_RELEASE_TABLET | Freq: Every day | ORAL | Status: DC
  Administered 2022-09-08 – 2022-09-12 (×5): 40 mg via ORAL
  Filled 2022-09-07 (×5): qty 1

## 2022-09-07 MED ORDER — ONDANSETRON HCL 4 MG/2ML IJ SOLN: 4.0000 mg | Freq: Four times a day (QID) | INTRAMUSCULAR | Status: DC | PRN

## 2022-09-07 MED ORDER — FUROSEMIDE 10 MG/ML IJ SOLN
60.0000 mg | Freq: Once | INTRAMUSCULAR | Status: AC
  Administered 2022-09-07: 60 mg via INTRAVENOUS
  Filled 2022-09-07: qty 8

## 2022-09-07 MED ORDER — ALBUTEROL SULFATE (2.5 MG/3ML) 0.083% IN NEBU: 2.5000 mg | INHALATION_SOLUTION | Freq: Four times a day (QID) | RESPIRATORY_TRACT | Status: DC | PRN

## 2022-09-07 MED ORDER — ACETAMINOPHEN 650 MG RE SUPP: 650.0000 mg | Freq: Four times a day (QID) | RECTAL | Status: DC | PRN

## 2022-09-07 MED ORDER — IPRATROPIUM-ALBUTEROL 0.5-2.5 (3) MG/3ML IN SOLN: 3.0000 mL | Freq: Four times a day (QID) | RESPIRATORY_TRACT | Status: AC | PRN

## 2022-09-07 MED ORDER — FERROUS SULFATE 325 (65 FE) MG PO TABS
325.0000 mg | ORAL_TABLET | Freq: Two times a day (BID) | ORAL | Status: DC
  Administered 2022-09-07 – 2022-09-10 (×6): 325 mg via ORAL
  Filled 2022-09-07 (×10): qty 1

## 2022-09-07 MED ORDER — APIXABAN 2.5 MG PO TABS
2.5000 mg | ORAL_TABLET | Freq: Two times a day (BID) | ORAL | Status: DC
  Administered 2022-09-07 – 2022-09-12 (×10): 2.5 mg via ORAL
  Filled 2022-09-07 (×10): qty 1

## 2022-09-07 NOTE — Assessment & Plan Note (Addendum)
-  Bupropion 150 mg daily, venlafaxine 150 mg daily resumed

## 2022-09-07 NOTE — Assessment & Plan Note (Addendum)
-  Patient had a complete echo on 07/01/2022 which was read as estimated ejection fraction 50-55 percent.  Grade 2 diastolic dysfunction. - Strict I's and O's - Status post furosemide 60 mg IV per EDP

## 2022-09-07 NOTE — Assessment & Plan Note (Signed)
-  Resumed levothyroxine 50 mcg daily

## 2022-09-07 NOTE — ED Notes (Signed)
Assumed care from Natalie, RN. Pt resting comfortably in bed at this time. Pt denies any current needs or questions. Call light with in reach.   

## 2022-09-07 NOTE — Assessment & Plan Note (Signed)
-  Eliquis 2.5 mg p.o. twice daily resumed - This is her renally and weight-based dosing - Carvedilol resumed

## 2022-09-07 NOTE — ED Notes (Signed)
Pt placed on 2L [place]

## 2022-09-07 NOTE — Assessment & Plan Note (Addendum)
-  ED has ordered a VQ scan given that patient is not a candidate for contrast due to CKD stage IV

## 2022-09-07 NOTE — Assessment & Plan Note (Signed)
-  Presumed secondary to acute exacerbation of heart failure preserved ejection fraction -Treat per above

## 2022-09-07 NOTE — Assessment & Plan Note (Signed)
-  As needed nicotine patch ordered ?

## 2022-09-07 NOTE — Assessment & Plan Note (Signed)
-  Appears to be at baseline 

## 2022-09-07 NOTE — Hospital Course (Addendum)
Dawn Sherman is a 67 year old female with history of paroxysmal atrial fibrillation on Eliquis, CKD stage IV, hyperlipidemia, depression, hypothyroid, hypertension, GERD, copd, current tobacco use, anxiety, who presents emergency department for chief concerns of shortness of breath for 2 days.  Initial vitals in the ED showed temperature of 97.6, respiration of 25, heart rate of 80, blood pressure 120/98, SpO2 of 2 L nasal cannula.  Serum sodium is 134, potassium 5.1, chloride 101, bicarb 22, BUN of 35, serum creatinine of 2.70, EGFR 19, nonfasting blood glucose 82, WBC 10.8, hemoglobin 10.7, platelets of 366.  BNP was elevated at 4096.6.  COVID/influenza A/influenza B/RSV PCR were negative.  D-dimer ordered in triage was elevated at 5.96.  Initial high sensitive troponin was 14.  Chest x-ray 2 view was read as new small moderate, right pleural effusion and right basilar atelectasis.  Stable cardiomegaly.  ED treatment: Furosemide 60 mg IV one-time dose.

## 2022-09-07 NOTE — H&P (Signed)
History and Physical   Dawn Sherman HER:[id number] DOB: 15-Jan-1956 DOA: 09/07/2022  PCP: [name], MD  Outpatient Specialists: Dr. Rockey Situ, this [facility] cardiology Patient coming from: Home  I have personally briefly reviewed patient's old medical records in [place].  Chief Concern: Shortness of breath  HPI: [name] is a 67 year old female with history of paroxysmal atrial fibrillation on Eliquis, CKD stage IV, hyperlipidemia, depression, hypothyroid, hypertension, GERD, copd, current tobacco use, anxiety, who presents emergency department for chief concerns of shortness of breath for 2 days.  Initial vitals in the ED showed temperature of 97.6, respiration of 25, heart rate of 80, blood pressure 120/98, SpO2 of 2 L nasal cannula.  Serum sodium is 134, potassium 5.1, chloride 101, bicarb 22, BUN of 35, serum creatinine of 2.70, EGFR 19, nonfasting blood glucose 82, WBC 10.8, hemoglobin 10.7, platelets of 366.  BNP was elevated at 4096.6.  COVID/influenza A/influenza B/RSV PCR were negative.  D-dimer ordered in triage was elevated at 5.96.  Initial high sensitive troponin was 14.  Chest x-ray 2 view was read as new small moderate, right pleural effusion and right basilar atelectasis.  Stable cardiomegaly.  ED treatment: Furosemide 60 mg IV one-time dose. ---------------------------------- At bedside, she is able to tell me her age, name, current location.  She reports she has been having shortness of breath that is worse with exertion.  This has been ongoing for about a week and has worsened significantly over the last 2 days.  She denies chest pain, nausea, vomiting, fever, known sick contacts, dysuria, hematuria, blood in her stool, melena.  She reports epigastric discomfort. She was sent for an [place] of her abdomen and it was negative. She reports the epigastric discomfort goes away after bowel movements and passing flatulence. She denies changes to her weight.    She denies missing home Eliquis dosing.  She endorses compliance with her medications.  She endorses that using her inhaler did minimally help improve the worsening shortness of breath.  She endorses bilateral lower extremity swelling with possible weight gain as her pants were getting tighter.  She did not check her weight to be exact.  Approximate daily fluid intake: Water: She consumes about a 50oz jug worth of ice Tea: none Coffee: 2 cups in the AM; 1 cup at lunch and they are decaf Milk: with cereal Soup: none Juice: none Etoh: none  Social history: She lives with her husband and her grandson. She endorses current tobacco use, less than half a pack per day. She denies etoh and recreational drug use. Her last etoh drink was about 2-3 years ago. She is retired and formerly worked at an [employer].   ROS: Constitutional: + weight change (gain), no fever ENT/Mouth: no sore throat, no rhinorrhea Eyes: no eye pain, no vision changes Cardiovascular: no chest pain, + dyspnea,  + edema, no palpitations Respiratory: no cough, no sputum, no wheezing Gastrointestinal: no nausea, no vomiting, no diarrhea, no constipation Genitourinary: no urinary incontinence, no dysuria, no hematuria Musculoskeletal: no arthralgias, no myalgias Skin: no skin lesions, no pruritus, Neuro: + weakness, no loss of consciousness, no syncope Psych: no anxiety, no depression, no decrease appetite Heme/Lymph: no bruising, no bleeding  ED Course: Discussed with emergency medicine provider, patient requiring hospitalization for chief concerns of acute hypoxic respiratory failure.  Assessment/Plan  Principal Problem:   Acute hypoxemic respiratory failure (HCC) Active Problems:   Generalized weakness   Persistent atrial fibrillation (HCC)   CKD (chronic kidney disease),  stage IV (HCC)   Hypertension   HLD (hyperlipidemia)   Tobacco use   GERD (gastroesophageal reflux disease)   History of alcohol  abuse   Leukocytosis   Anemia   SOB (shortness of breath)   (HFpEF) heart failure with preserved ejection fraction (HCC)   Positive D dimer   Depression with anxiety   Hypothyroidism   Assessment and Plan:  * Acute hypoxemic respiratory failure (HCC) Presumed secondary to heart failure, with possible copd exacerbation complicated by tobacco use - Continue oxygen supplementation to maintain SpO2 greater than 92% - Presumed secondary to heart failure exacerbation - Check procalcitonin, if positive will initiate community-acquired pneumonia treatment - Follow-up on VQ scan, though low clinical suspicion for PE given that patient is on Eliquis  Persistent atrial fibrillation (HCC) - Eliquis 2.5 mg p.o. twice daily resumed - This is her renally and weight-based dosing - Carvedilol resumed  CKD (chronic kidney disease), stage IV (HCC) - Appears to be at baseline  Hypertension - Hydralazine 25 mg p.o. 3 times daily, diltiazem 120 mg p.o. daily resumed - Labetalol 5 mg IV every 3 hours as needed for SBP greater than 175, 4 doses ordered and hydralazine 5 mg IV every 8 hours as needed for SBP greater than 175 starting on 09/08/2022 at 3 AM ordered  HLD (hyperlipidemia) - Atorvastatin 40 mg daily resumed  Tobacco use - As needed nicotine patch ordered  GERD (gastroesophageal reflux disease) - Home PPI resumed  Hypothyroidism - Resumed levothyroxine 50 mcg daily  Depression with anxiety - Bupropion 150 mg daily, venlafaxine 150 mg daily resumed  Positive D dimer - ED has ordered a VQ scan given that patient is not a candidate for contrast due to CKD stage IV  (HFpEF) heart failure with preserved ejection fraction ([place]) - Patient had a complete echo on 07/01/2022 which was read as estimated ejection fraction 50-55 percent.  Grade 2 diastolic dysfunction. - Strict I's and O's - Status post furosemide 60 mg IV per EDP  SOB (shortness of breath) - Presumed secondary to acute  exacerbation of heart failure preserved ejection fraction -Treat per above  Leukocytosis - Check procalcitonin  Chart reviewed.   DVT prophylaxis: Eliquis 2.5 mg p.o. twice daily Code Status: full code Diet: Heart healthy Family Communication: a phone was offered, patient declined Disposition Plan: Pending clinical course Consults called: None at this time Admission status: Telemetry medical, inpatient  Past Medical History:  Diagnosis Date   B12 deficiency    CHF (congestive heart failure) (HCC)    CKD (chronic kidney disease), stage III (HCC)    COPD (chronic obstructive pulmonary disease) ([place])    Depression    Endometriosis    Hypertension    Mixed hyperlipidemia    Tobacco abuse    Past Surgical History:  Procedure Laterality Date   ABDOMINAL HYSTERECTOMY     BREAST BIOPSY Right 2015   neg-  FIBROADENOMA   TAH/RSO  1999   secondary to bleeding and endometriosis (Dr Vernie Ammons)   Social History:  reports that she has been smoking cigarettes. She has a 10.00 pack-year smoking history. She has never used smokeless tobacco. She reports that she does not currently use alcohol. She reports that she does not use drugs.  Allergies  Allergen Reactions   Sulfate Rash   Codeine Sulfate Nausea Only   Benicar [Olmesartan]     Talked with patient February 10, 2020, intolerance is unclear, tried several medications around that time and one of them gave  her a rash but she is not clear which 1.   Amoxicillin Rash   Clindamycin/Lincomycin Rash   Morphine And Related Rash   Penicillins Rash   Family History  Problem Relation Age of Onset   Hypercholesterolemia Mother    Rheum arthritis Father    Rheum arthritis Daughter    Fibromyalgia Daughter    Breast cancer Neg Hx    Colon cancer Neg Hx    Family history: Family history reviewed and not pertinent.  Prior to Admission medications   Medication Sig Start Date End Date Taking? Authorizing Provider  albuterol (VENTOLIN HFA)  108 (90 Base) MCG/ACT inhaler Inhale 2 puffs into the lungs every 6 (six) hours as needed for wheezing or shortness of breath. 03/14/22  Yes Lucrezia Starch, MD  amiodarone (PACERONE) 200 MG tablet Take 2 tablets twice a day for 6 more days then 1 tablet twice a day for 7 days then 1 tablet daily 07/05/22  Yes Dwyane Dee, MD  apixaban (ELIQUIS) 2.5 MG TABS tablet Take 1 tablet (2.5 mg total) by mouth 2 (two) times daily. 04/08/22  Yes Gollan, Kathlene November, MD  atorvastatin (LIPITOR) 40 MG tablet TAKE ONE TABLET BY MOUTH EVERY DAY 09/03/22  Yes [name], MD  buPROPion (WELLBUTRIN XL) 150 MG 24 hr tablet TAKE ONE TABLET BY MOUTH EVERY DAY 07/15/22  Yes [name], MD  calcium carbonate (OS-CAL - DOSED IN MG OF ELEMENTAL CALCIUM) 1250 (500 Ca) MG tablet Take 1 tablet (500 mg of elemental calcium total) by mouth 3 (three) times daily with meals. 12/14/21  Yes Fritzi Mandes, MD  diltiazem (CARDIZEM CD) 120 MG 24 hr capsule Take 1 capsule (120 mg total) by mouth daily. 07/06/22  Yes Dwyane Dee, MD  FARXIGA 10 MG TABS tablet Take 10 mg by mouth daily. 11/20/21  Yes [provider]  ferrous sulfate 325 (65 FE) MG tablet Take 1 tablet (325 mg total) by mouth 2 (two) times daily with a meal. 12/14/21  Yes Fritzi Mandes, MD  Fluticasone-Umeclidin-Vilant (TRELEGY ELLIPTA) 100-62.5-25 MCG/ACT AEPB Inhale 1 puff into the lungs daily. 03/05/22  Yes [name], MD  furosemide (LASIX) 40 MG tablet Take 1 tablet (40 mg total) by mouth daily. Take 40 mg daily and and extra 40 mg as needed for fluid retention 04/24/22  Yes Darylene Price A, FNP  hydrALAZINE (APRESOLINE) 25 MG tablet Take 1 tablet (25 mg total) by mouth 3 (three) times daily. 01/31/22  Yes Hackney, Otila Kluver A, FNP  levothyroxine (SYNTHROID) 50 MCG tablet Take 1 tablet (50 mcg total) by mouth daily. 07/29/22  Yes [name], MD  loratadine (CLARITIN) 10 MG tablet Take 10 mg by mouth daily.   Yes [provider]  omeprazole  (PRILOSEC) 20 MG capsule Take 20 mg by mouth daily.   Yes [provider]  venlafaxine XR (EFFEXOR-XR) 150 MG 24 hr capsule TAKE ONE CAPSULE BY MOUTH EVERY DAY 06/24/22  Yes [name], MD   Physical Exam: Vitals:   09/07/22 1230 09/07/22 1300 09/07/22 1330 09/07/22 1400  BP: 137/77 (!) 120/98 (!) 140/71 (!) 144/74  Pulse: 82 80 85 85  Resp: (!) 26 (!) 25 (!) 22 (!) 23  Temp:      TempSrc:      SpO2: 96% 96% 93% 94%  Weight:      Height:       Constitutional: appears age appropriate, frail, NAD, calm, comfortable Eyes: PERRL, lids and conjunctivae normal ENMT: Mucous membranes are moist. Posterior  pharynx clear of any exudate or lesions. Age-appropriate dentition. Hearing appropriate Neck: normal, supple, no masses, no thyromegaly Respiratory: clear to auscultation bilaterally, no wheezing, no crackles. Normal respiratory effort. No accessory muscle use.  Cardiovascular: Regular rate and rhythm, no murmurs / rubs / gallops.  Bilateral 2+ pitting lower extremity edema. 2+ pedal pulses. No carotid bruits.  Abdomen: no tenderness, no masses palpated, no hepatosplenomegaly. Bowel sounds positive.  Musculoskeletal: no clubbing / cyanosis. No joint deformity upper and lower extremities. Good ROM, no contractures, no atrophy. Normal muscle tone.  Skin: no rashes, lesions, ulcers. No induration Neurologic: Sensation intact. Strength 5/5 in all 4.  Psychiatric: Normal judgment and insight. Alert and oriented x 3. Normal mood.   EKG: independently reviewed, showing sinus rhythm with rate of 82, QTc 447  Chest x-ray on Admission: I personally reviewed and I agree with radiologist reading as below.  DG Chest 2 View  Result Date: 09/07/2022 CLINICAL DATA:  Worsening shortness of breath for several days. EXAM: CHEST - 2 VIEW COMPARISON:  06/30/2022 FINDINGS: Stable cardiomegaly. New small to moderate right pleural effusion is seen with right basilar atelectasis. Left lung is clear.  Multiple old left rib fracture deformities and thoracic dextroscoliosis remains stable. IMPRESSION: New small to moderate right pleural effusion and right basilar atelectasis. Stable cardiomegaly. Electronically Signed   By: Marlaine Hind M.D.   On: 09/07/2022 13:23    Labs on Admission: I have personally reviewed following labs  CBC: Recent Labs  Lab 09/07/22 1212  WBC 10.8*  NEUTROABS 8.5*  HGB 10.7*  HCT 35.1*  MCV 87.5  PLT 811   Basic Metabolic Panel: Recent Labs  Lab 09/07/22 1212  NA 134*  K 5.1  CL 101  CO2 22  GLUCOSE 82  BUN 35*  CREATININE 2.70*  CALCIUM 9.2   GFR: Estimated Creatinine Clearance: 18.2 mL/min (A) (by C-G formula based on SCr of 2.7 mg/dL (H)).  Liver Function Tests: Recent Labs  Lab 09/07/22 1212  AST 29  ALT 24  ALKPHOS 144*  BILITOT 0.6  PROT 7.4  ALBUMIN 4.0   Urine analysis:    Component Value Date/Time   COLORURINE YELLOW (A) 03/25/2022 2210   APPEARANCEUR CLEAR (A) 03/25/2022 2210   LABSPEC 1.016 03/25/2022 2210   PHURINE 5.0 03/25/2022 2210   GLUCOSEU NEGATIVE 03/25/2022 2210   GLUCOSEU NEGATIVE 01/22/2017 1052   HGBUR NEGATIVE 03/25/2022 2210   BILIRUBINUR NEGATIVE 03/25/2022 2210   KETONESUR NEGATIVE 03/25/2022 2210   PROTEINUR >=300 (A) 03/25/2022 2210   UROBILINOGEN 0.2 01/22/2017 1052   NITRITE NEGATIVE 03/25/2022 2210   LEUKOCYTESUR NEGATIVE 03/25/2022 2210   This document was prepared using Dragon Voice Recognition software and may include unintentional dictation errors.  Dr. Tobie Poet [facility]  If 7PM-7AM, please contact overnight-coverage provider If 7AM-7PM, please contact day coverage provider [email or web address]  09/07/2022, 3:14 PM

## 2022-09-07 NOTE — Assessment & Plan Note (Addendum)
-  Home PPI resumed

## 2022-09-07 NOTE — Assessment & Plan Note (Signed)
-  Check procalcitonin

## 2022-09-07 NOTE — Assessment & Plan Note (Signed)
-  Hydralazine 25 mg p.o. 3 times daily, diltiazem 120 mg p.o. daily resumed - Labetalol 5 mg IV every 3 hours as needed for SBP greater than 175, 4 doses ordered and hydralazine 5 mg IV every 8 hours as needed for SBP greater than 175 starting on 09/08/2022 at 3 AM ordered

## 2022-09-07 NOTE — ED Provider Notes (Signed)
[facility] Provider Note    Event Date/Time   First MD Initiated Contact with Patient 09/07/22 1217     (approximate)   History   Shortness of Breath   HPI  Dawn Sherman is a 67 y.o. female   who presents to the emergency department today because of concerns for shortness of breath.  Patient states that the shortness of breath started a couple of days ago.  Has progressively gotten worse.  The patient does have history of heart failure. Has been taking her lasix but noticed that she has not been urinating as much as normal. She has had some back pain. She denies any fevers. Did have a hospitalization a couple of months ago for respiratory distress and CHF and states that this feels the same.      Physical Exam   Triage Vital Signs: ED Triage Vitals  Enc Vitals Group     BP 09/07/22 1207 (!) 144/71     Pulse Rate 09/07/22 1207 84     Resp 09/07/22 1207 (!) 22     Temp 09/07/22 1207 97.8 F (36.6 C)     Temp Source 09/07/22 1207 Oral     SpO2 09/07/22 1207 (!) 88 %     Weight 09/07/22 1208 129 lb (58.5 kg)     Height 09/07/22 1208 5\' 5"  (1.651 m)     Head Circumference --      Peak Flow --      Pain Score 09/07/22 1207 0     Pain Loc --      Pain Edu? --      Excl. in [place]? --     Most recent vital signs: Vitals:   09/07/22 1207  BP: (!) 144/71  Pulse: 84  Resp: (!) 22  Temp: 97.8 F (36.6 C)  SpO2: (!) 88%   General: Awake, alert, oriented. CV:  Good peripheral perfusion. Regular rate and rhythm. Resp:  Slightly increased work of breathing. Abd:  No distention.    ED Results / Procedures / Treatments   Labs (all labs ordered are listed, but only abnormal results are displayed) Labs Reviewed  CBC WITH DIFFERENTIAL/PLATELET  COMPREHENSIVE METABOLIC PANEL  D-DIMER, QUANTITATIVE     EKG  I, Nance Pear, attending physician, personally viewed and interpreted this EKG  EKG Time: 1219 Rate: 82 Rhythm: sinus rhythm Axis:  normal Intervals: qtc 447 QRS: narrow, q waves v1, v2 ST changes: no st elevation Impression: abnormal ekg    RADIOLOGY I independently interpreted and visualized the CXR. My interpretation: Right pleural effusion Radiology interpretation:  IMPRESSION:  New small to moderate right pleural effusion and right basilar  atelectasis.    Stable cardiomegaly.     PROCEDURES:  Critical Care performed: Yes, see critical care procedure note(s)  Procedures  CRITICAL CARE Performed by: Nance Pear   Total critical care time: 30 minutes  Critical care time was exclusive of separately billable procedures and treating other patients.  Critical care was necessary to treat or prevent imminent or life-threatening deterioration.  Critical care was time spent personally by me on the following activities: development of treatment plan with patient and/or surrogate as well as nursing, discussions with consultants, evaluation of patient's response to treatment, examination of patient, obtaining history from patient or surrogate, ordering and performing treatments and interventions, ordering and review of laboratory studies, ordering and review of radiographic studies, pulse oximetry and re-evaluation of patient's condition.   MEDICATIONS ORDERED IN ED: Medications -  No data to display   IMPRESSION / MDM / [place] / ED COURSE  I reviewed the triage vital signs and the nursing notes.                              Differential diagnosis includes, but is not limited to, pneumonia, CHF, PE, COVID, influenza.  Patient's presentation is most consistent with acute presentation with potential threat to life or bodily function.   The patient is on the cardiac monitor to evaluate for evidence of arrhythmia and/or significant heart rate changes.  Patient presented to the emergency department today because of concerns for shortness of breath.  Patient was hypoxic on room air.  She  did feel better after being placed on 2 L nasal cannula and her oxygen saturations did improve.  On exam patient did have mild bilateral lower extremity edema.  Chest auscultation with some wheezing in the right lower lung.  Chest x-ray did show a right pleural effusion.  Blood work shows significantly elevated BNP.  I do think CHF is likely causing the majority of the patient's symptoms.  However D-dimer that was sent from triage was also elevated.  Given patient's GFR she cannot get a CTA, thus nuclear medicine study was ordered. Discussed with Dr. Tobie Poet with the hospitalist service who will plan on admission.     FINAL CLINICAL IMPRESSION(S) / ED DIAGNOSES   Final diagnoses:  Shortness of breath  Hypoxia  Congestive heart failure, unspecified HF chronicity, unspecified heart failure type [facility])     Note:  This document was prepared using Dragon voice recognition software and may include unintentional dictation errors.    Nance Pear, MD 09/07/22 [phone]

## 2022-09-07 NOTE — Assessment & Plan Note (Signed)
-  Atorvastatin 40 mg daily resumed

## 2022-09-07 NOTE — ED Triage Notes (Signed)
Pt has been having SHOB on and off for the last couple days and then it got worse this AM- pt denies CP but does have pain in her back when she breathes hard- pt having a hard time talking in complete sentences without getting winded- pt not normally on O2

## 2022-09-07 NOTE — Assessment & Plan Note (Addendum)
Presumed secondary to heart failure, with possible copd exacerbation complicated by tobacco use - Continue oxygen supplementation to maintain SpO2 greater than 92% - Presumed secondary to heart failure exacerbation - Check procalcitonin, if positive will initiate community-acquired pneumonia treatment - Follow-up on VQ scan, though low clinical suspicion for PE given that patient is on Eliquis

## 2022-09-08 DIAGNOSIS — J9601 Acute respiratory failure with hypoxia: Secondary | ICD-10-CM | POA: Diagnosis not present

## 2022-09-08 LAB — CBC
HCT: 32.8 % — ABNORMAL LOW (ref 36.0–46.0)
Hemoglobin: 10 g/dL — ABNORMAL LOW (ref 12.0–15.0)
MCH: 26.7 pg (ref 26.0–34.0)
MCHC: 30.5 g/dL (ref 30.0–36.0)
MCV: 87.5 fL (ref 80.0–100.0)
Platelets: 321 10*3/uL (ref 150–400)
RBC: 3.75 MIL/uL — ABNORMAL LOW (ref 3.87–5.11)
RDW: 16.2 % — ABNORMAL HIGH (ref 11.5–15.5)
WBC: 8.6 10*3/uL (ref 4.0–10.5)
nRBC: 0 % (ref 0.0–0.2)

## 2022-09-08 LAB — BASIC METABOLIC PANEL
Anion gap: 11 (ref 5–15)
BUN: 37 mg/dL — ABNORMAL HIGH (ref 8–23)
CO2: 22 mmol/L (ref 22–32)
Calcium: 9.1 mg/dL (ref 8.9–10.3)
Chloride: 102 mmol/L (ref 98–111)
Creatinine, Ser: 2.65 mg/dL — ABNORMAL HIGH (ref 0.44–1.00)
GFR, Estimated: 19 mL/min — ABNORMAL LOW (ref >60)
Glucose, Bld: 85 mg/dL (ref 70–99)
Potassium: 5.2 mmol/L — ABNORMAL HIGH (ref 3.5–5.1)
Sodium: 135 mmol/L (ref 135–145)

## 2022-09-08 LAB — PROCALCITONIN: Procalcitonin: <0.1 ng/mL

## 2022-09-08 MED ORDER — FUROSEMIDE 10 MG/ML IJ SOLN
60.0000 mg | Freq: Once | INTRAMUSCULAR | Status: AC
  Administered 2022-09-08: 60 mg via INTRAVENOUS
  Filled 2022-09-08: qty 8

## 2022-09-08 MED ORDER — PREDNISONE 20 MG PO TABS
40.0000 mg | ORAL_TABLET | Freq: Every day | ORAL | Status: AC
  Administered 2022-09-08 – 2022-09-12 (×5): 40 mg via ORAL
  Filled 2022-09-08 (×5): qty 2

## 2022-09-08 NOTE — TOC Initial Note (Addendum)
Transition of Care [facility]) - Initial/Assessment Note    Patient Details  Name: Dawn Sherman MRN: [id number] Date of Birth: [birth year]  Transition of Care [facility]) CM/SW Contact:    Magnus Ivan, LCSW Phone Number: 09/08/2022, 4:13 PM  Clinical Narrative:                 CSW spoke with patient for high readmission risk assessment. Patient lives with her husband and teen grandson. PCP is Dr. [name]. Pharmacy is [facility] in [place]. Patient can drive or her husband can provide transport when needed. No HH or DME history. Patient declines TOC needs at this time. Patient is currently on o2 but states she is not on it at home, [facility] to follow for home o2 needs prior to DC.  Patient states at times she has trouble affording her Trelegy and Eliquis - CSW trying to reach out to Pharmacy for possible resources/assistance with this, also notified DO and RN.   Expected Discharge Plan: Home/Self Care Barriers to Discharge: Continued Medical Work up   Patient Goals and CMS Choice Patient states their goals for this hospitalization and ongoing recovery are:: home with family CMS Medicare.gov Compare Post Acute Care list provided to:: Patient Choice offered to / list presented to : Patient      Expected Discharge Plan and Services       Living arrangements for the past 2 months: Single Family Home                                      Prior Living Arrangements/Services Living arrangements for the past 2 months: Single Family Home Lives with:: Spouse, Relatives Patient language and need for interpreter reviewed:: Yes Do you feel safe going back to the place where you live?: Yes      Need for Family Participation in Patient Care: Yes (Comment) Care giver support system in place?: Yes (comment)   Criminal Activity/Legal Involvement Pertinent to Current Situation/Hospitalization: No - Comment as needed  Activities of Daily Living Home Assistive Devices/Equipment: None ADL  Screening (condition at time of admission) Patient's cognitive ability adequate to safely complete daily activities?: Yes Is the patient deaf or have difficulty hearing?: No Does the patient have difficulty seeing, even when wearing glasses/contacts?: No Does the patient have difficulty concentrating, remembering, or making decisions?: No Patient able to express need for assistance with ADLs?: No Does the patient have difficulty dressing or bathing?: No Independently performs ADLs?: Yes (appropriate for developmental age) Does the patient have difficulty walking or climbing stairs?: No Weakness of Legs: None Weakness of Arms/Hands: None  Permission Sought/Granted Permission sought to share information with : Customer service manager, Family Supports Permission granted to share information with : Yes, Verbal Permission Granted     Permission granted to share info w AGENCY: as needed  Permission granted to share info w Relationship: spouse     Emotional Assessment       Orientation: : Oriented to Self, Oriented to Place, Oriented to  Time, Oriented to Situation Alcohol / Substance Use: Not Applicable Psych Involvement: No (comment)  Admission diagnosis:  Shortness of breath [R06.02] Hypoxia [R09.02] Acute hypoxemic respiratory failure (HCC) [J96.01] Congestive heart failure, unspecified HF chronicity, unspecified heart failure type [facility]) [I50.9] Patient Active Problem List   Diagnosis Date Noted   (HFpEF) heart failure with preserved ejection fraction ([place]) 09/07/2022   Positive D dimer 09/07/2022  Depression with anxiety 09/07/2022   Hypothyroidism 09/07/2022   Epigastric pain 08/14/2022   SOB (shortness of breath) 07/16/2022   Fatigue 07/16/2022   Acute on chronic congestive heart failure ([place]) 07/04/2022   Acute hypoxemic respiratory failure ([place]) 06/30/2022   Tachycardia 06/30/2022   Anemia 05/06/2022   Chronic heart failure with preserved ejection fraction  ([place]) 03/26/2022   CKD (chronic kidney disease), stage IV ([place]) 03/26/2022   Iron deficiency anemia 03/26/2022   Generalized weakness 12/17/2021   Essential hypertension 12/17/2021   Dyslipidemia 12/17/2021   Depression, major, single episode, complete remission ([place]) 12/17/2021   GERD without esophagitis 12/17/2021   Chronic diastolic CHF (congestive heart failure) ([place]) 12/17/2021   AKI (acute kidney injury) ([place]) 12/16/2021   Second degree AV block    History of subarachnoid hemorrhage 12/10/2021   Hypokalemia 12/10/2021   Hyponatremia 12/10/2021   Acute ITP ([place]) 12/10/2021   Persistent atrial fibrillation ([place]) 12/10/2021   Hypotension 12/10/2021   Swelling of lower extremity 10/28/2021   Prediabetes 08/19/2021   Chronic obstructive pulmonary disease ([place]) 11/22/2020   Paroxysmal atrial fibrillation (HCC) 08/12/2020   Dehydration    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HCC)    Acute on chronic combined systolic and diastolic CHF (congestive heart failure) ([place]) 02/10/2020   Leukocytosis 02/10/2020   HLD (hyperlipidemia) 02/10/2020   Elevated troponin 02/10/2020   GERD (gastroesophageal reflux disease) 02/10/2020   Acute respiratory failure with hypoxia ([place]) 02/10/2020   Abnormal mammogram 07/13/2019   History of alcohol abuse 07/31/2018   B12 deficiency 07/31/2018   Acute renal failure superimposed on stage 3b chronic kidney disease ([place]) 11/03/2016   Health care maintenance 12/02/2014   Tobacco use 12/02/2014   Hypercholesterolemia 11/29/2014   Encounter for screening colonoscopy 12/16/2013   Breast mass 12/15/2012   Hypertension 07/05/2012   Depression, major, single episode, mild ([place]) 07/05/2012   PCP:  [name], MD Pharmacy:   [facility], [place]  [address] Phone: [phone] Fax: [phone]     Social Determinants of Health (SDOH) Social History: SDOH Screenings   Food  Insecurity: No Food Insecurity (09/07/2022)  Housing: Low Risk  (09/07/2022)  Transportation Needs: No Transportation Needs (09/07/2022)  Utilities: Not At Risk (09/07/2022)  Depression (PHQ2-9): Low Risk  (08/14/2022)  Financial Resource Strain: Low Risk  (11/27/2021)  Physical Activity: Sufficiently Active (11/27/2021)  Social Connections: Unknown (11/27/2021)  Stress: No Stress Concern Present (11/27/2021)  Tobacco Use: High Risk (09/07/2022)   SDOH Interventions:     Readmission Risk Interventions    07/02/2022   12:26 PM 12/11/2021    3:17 PM  Readmission Risk Prevention Plan  Transportation Screening Complete Complete  PCP or Specialist Appt within 3-5 Days  Complete  HRI or [place]  Complete  Social Work Consult for [place] Planning/Counseling  Complete  Palliative Care Screening  Not Applicable  Medication Review Press photographer) Complete Complete  PCP or Specialist appointment within 3-5 days of discharge Complete   SW Recovery Care/Counseling Consult Complete   [place] Not Applicable

## 2022-09-08 NOTE — Progress Notes (Signed)
PROGRESS NOTE    Dawn Sherman   PPI:[id number] DOB: 1956/08/21  DOA: 09/07/2022 Date of Service: 09/08/22 PCP: [name], MD     Brief Narrative / Hospital Course:  Dawn Sherman is a 67 year old female with history of chronic combined systolic/diastolic CHF, paroxysmal atrial fibrillation on Eliquis, CKD stage IV, hyperlipidemia, depression, hypothyroid, hypertension, GERD, copd, current tobacco use, anxiety, who presents emergency department for chief concerns of shortness of breath for 2 days. 01/13: SpO2 88% RA --> 96% 2L [place]. RR max 26. WBC 10.8, Hgb 10.7. Cr 2.7, BUN 35, Na 134, K 5.1. BNP 4000+. Procal <0.10. COVID/influenza A/influenza B/RSV PCR were negative. D-dimer ordered in triage was elevated at 5.96, VQ neg.  Initial high sensitive troponin was 14. Chest x-ray 2 view was read as new small moderate, right pleural effusion and right basilar atelectasis.  Stable cardiomegaly. ED treatment: Furosemide 60 mg IV one-time dose. Admitted to hospitalist service for treatment CHF.  01/14: remains on O2 this morning. VSS otherwise, RR improved. No output documented, but rales and wheezing on exam. Continue diuretics and nebs.   Consultants:  none  Procedures: none    ASSESSMENT & PLAN:   Principal Problem:   Acute hypoxemic respiratory failure (HCC) Active Problems:   Generalized weakness   Persistent atrial fibrillation (HCC)   CKD (chronic kidney disease), stage IV (HCC)   Hypertension   HLD (hyperlipidemia)   Tobacco use   GERD (gastroesophageal reflux disease)   History of alcohol abuse   Leukocytosis   Swelling of lower extremity   Anemia   SOB (shortness of breath)   (HFpEF) heart failure with preserved ejection fraction (HCC)   Positive D dimer   Depression with anxiety   Hypothyroidism  Acute hypoxemic respiratory failure (HCC) d/t HFpEF exacerbation +/- COPD exacerbation Have reasonably r/o PE and pneumonia Presumed secondary to heart failure,  with possible copd exacerbation complicated by tobacco use Continue oxygen supplementation to maintain SpO2 greater than 92% Underlying conditions as below  Acute on chronic (HFpEF) heart failure with preserved ejection fraction ([place]) echo on 07/01/2022 which was read as estimated ejection fraction 50-55 percent.  Grade 2 diastolic dysfunction. Diuresis Strict I&O Fluid restriction Will not repeat echo at this time  COPD exacerbation Prednisone burst  nebs  CKD (chronic kidney disease), stage IV (HCC) baseline Question cardiorenal syndrome Monitor periodic BMP  Tobacco use As needed nicotine patch ordered  Persistent atrial fibrillation (HCC) Eliquis 2.5 mg p.o. twice daily renal dosing Carvedilol   HLD (hyperlipidemia) Atorvastatin 40 mg daily  GERD (gastroesophageal reflux disease) PPI   Hypertension Continue home Hydralazine 25 mg p.o. 3 times daily, diltiazem 120 mg p.o. daily  Labetalol 5 mg IV every 3 hours as needed for SBP greater than 175, 4 doses ordered and hydralazine 5 mg IV every 8 hours as needed for SBP greater than 175 starting on 09/08/2022 at 3 AM ordered  Depression with anxiety Continue home bupropion 150 mg daily, venlafaxine 150 mg daily  Hypothyroidism levothyroxine 50 mcg daily  Leukocytosis Neg procalcitonin Likely reactive Trend CBC  Positive D dimer Likely d/t inflammatory state VQ neg    DVT prophylaxis: Eliquis  Pertinent IV fluids/nutrition: no continuous IV fluids, fluid restriction d/t CHF Central lines / invasive devices: none  Code Status: FULL CODE   Current Admission Status: inpatient   TOC needs / Dispo plan: no needs at this time, anticipate d/c back to previous home environment Barriers to discharge / significant pending items: treatment  CHF. O2 requirement, anticipate d/c in 1-2 days w/ expected response to treatment              Subjective / Brief ROS:  Patient reports breathing a bit better form  yesterday, otherwise tired is only complaint Denies CP/SOB a trest, reports some SOB w/ activity Pain controlled.  Denies new weakness.  Tolerating diet.  Reports no concerns w/ urination/defecation.   Family Communication: none at this time     Objective Findings:  Vitals:   09/07/22 1703 09/07/22 1936 09/08/22 0431 09/08/22 0750  BP: (!) 126/54 117/69 137/80 (!) 148/77  Pulse: 84 84 85 88  Resp:  20 20 17   Temp: 98.1 F (36.7 C) 97.7 F (36.5 C) 98.1 F (36.7 C) 97.6 F (36.4 C)  TempSrc: Oral Oral  Oral  SpO2: 96% 94% 93% 92%  Weight:      Height:        Intake/Output Summary (Last 24 hours) at 09/08/2022 1237 Last data filed at 09/08/2022 0500 Gross per 24 hour  Intake --  Output 0 ml  Net 0 ml   Filed Weights   09/07/22 1208  Weight: 58.5 kg    Examination:  Physical Exam Constitutional:      General: She is not in acute distress.    Appearance: She is well-developed.  Cardiovascular:     Rate and Rhythm: Tachycardia present. Rhythm irregular.  Pulmonary:     Breath sounds: Examination of the right-middle field reveals rales. Examination of the left-middle field reveals rales. Examination of the right-lower field reveals rales. Examination of the left-lower field reveals rales. Wheezing and rales present. No decreased breath sounds.  Musculoskeletal:     Right lower leg: No edema.     Left lower leg: No edema.  Skin:    General: Skin is warm and dry.  Neurological:     General: No focal deficit present.     Mental Status: She is alert and oriented to person, place, and time.  Psychiatric:        Mood and Affect: Mood normal.        Behavior: Behavior normal.          Scheduled Medications:   amiodarone  200 mg Oral Daily   apixaban  2.5 mg Oral BID   atorvastatin  40 mg Oral Daily   buPROPion  150 mg Oral Daily   diltiazem  120 mg Oral Daily   ferrous sulfate  325 mg Oral BID WC   fluticasone furoate-vilanterol  1 puff Inhalation Daily    And   umeclidinium bromide  1 puff Inhalation Daily   furosemide  60 mg Intravenous Once   hydrALAZINE  25 mg Oral TID   levothyroxine  50 mcg Oral Q0600   pantoprazole  40 mg Oral Daily   predniSONE  40 mg Oral Q breakfast   venlafaxine XR  150 mg Oral Daily    Continuous Infusions:   PRN Medications:  acetaminophen **OR** acetaminophen, albuterol, hydrALAZINE, ipratropium-albuterol, labetalol, nicotine, ondansetron **OR** ondansetron (ZOFRAN) IV, senna-docusate  Antimicrobials from admission:  Anti-infectives (From admission, onward)    None           Data Reviewed:  I have personally reviewed the following...  CBC: Recent Labs  Lab 09/07/22 1212 09/08/22 0528  WBC 10.8* 8.6  NEUTROABS 8.5*  --   HGB 10.7* 10.0*  HCT 35.1* 32.8*  MCV 87.5 87.5  PLT 366 962   Basic Metabolic Panel: Recent Labs  Lab 09/07/22 1212 09/08/22 0528  NA 134* 135  K 5.1 5.2*  CL 101 102  CO2 22 22  GLUCOSE 82 85  BUN 35* 37*  CREATININE 2.70* 2.65*  CALCIUM 9.2 9.1   GFR: Estimated Creatinine Clearance: 18.5 mL/min (A) (by C-G formula based on SCr of 2.65 mg/dL (H)). Liver Function Tests: Recent Labs  Lab 09/07/22 1212  AST 29  ALT 24  ALKPHOS 144*  BILITOT 0.6  PROT 7.4  ALBUMIN 4.0   No results for input(s): "LIPASE", "AMYLASE" in the last 168 hours. No results for input(s): "AMMONIA" in the last 168 hours. Coagulation Profile: No results for input(s): "INR", "PROTIME" in the last 168 hours. Cardiac Enzymes: No results for input(s): "CKTOTAL", "CKMB", "CKMBINDEX", "TROPONINI" in the last 168 hours. BNP (last 3 results) No results for input(s): "PROBNP" in the last 8760 hours. HbA1C: No results for input(s): "HGBA1C" in the last 72 hours. CBG: No results for input(s): "GLUCAP" in the last 168 hours. Lipid Profile: No results for input(s): "CHOL", "HDL", "LDLCALC", "TRIG", "CHOLHDL", "LDLDIRECT" in the last 72 hours. Thyroid Function Tests: No results for  input(s): "TSH", "T4TOTAL", "FREET4", "T3FREE", "THYROIDAB" in the last 72 hours. Anemia Panel: No results for input(s): "VITAMINB12", "FOLATE", "FERRITIN", "TIBC", "IRON", "RETICCTPCT" in the last 72 hours. Most Recent Urinalysis On File:     Component Value Date/Time   COLORURINE YELLOW (A) 03/25/2022 2210   APPEARANCEUR CLEAR (A) 03/25/2022 2210   LABSPEC 1.016 03/25/2022 2210   PHURINE 5.0 03/25/2022 2210   GLUCOSEU NEGATIVE 03/25/2022 2210   GLUCOSEU NEGATIVE 01/22/2017 1052   HGBUR NEGATIVE 03/25/2022 2210   BILIRUBINUR NEGATIVE 03/25/2022 2210   KETONESUR NEGATIVE 03/25/2022 2210   PROTEINUR >=300 (A) 03/25/2022 2210   UROBILINOGEN 0.2 01/22/2017 1052   NITRITE NEGATIVE 03/25/2022 2210   LEUKOCYTESUR NEGATIVE 03/25/2022 2210   Sepsis Labs: @LABRCNTIP (procalcitonin:4,lacticidven:4) Microbiology: Recent Results (from the past 240 hour(s))  Resp panel by RT-PCR (RSV, Flu A&B, Covid) Anterior Nasal Swab     Status: None   Collection Time: 09/07/22 12:24 PM   Specimen: Anterior Nasal Swab  Result Value Ref Range Status   SARS Coronavirus 2 by RT PCR NEGATIVE NEGATIVE Final    Comment: (NOTE) SARS-CoV-2 target nucleic acids are NOT DETECTED.  The SARS-CoV-2 RNA is generally detectable in upper respiratory specimens during the acute phase of infection. The lowest concentration of SARS-CoV-2 viral copies this assay can detect is 138 copies/mL. A negative result does not preclude SARS-Cov-2 infection and should not be used as the sole basis for treatment or other patient management decisions. A negative result may occur with  improper specimen collection/handling, submission of specimen other than nasopharyngeal swab, presence of viral mutation(s) within the areas targeted by this assay, and inadequate number of viral copies(<138 copies/mL). A negative result must be combined with clinical observations, patient history, and epidemiological information. The expected result is  Negative.  Fact Sheet for Patients:  EntrepreneurPulse.com.au  Fact Sheet for Healthcare Providers:  IncredibleEmployment.be  This test is no t yet approved or cleared by the [place] FDA and  has been authorized for detection and/or diagnosis of SARS-CoV-2 by FDA under an Emergency Use Authorization (EUA). This EUA will remain  in effect (meaning this test can be used) for the duration of the COVID-19 declaration under Section 564(b)(1) of the Act, 21 U.S.C.section 360bbb-3(b)(1), unless the authorization is terminated  or revoked sooner.       Influenza A by PCR NEGATIVE NEGATIVE Final  Influenza B by PCR NEGATIVE NEGATIVE Final    Comment: (NOTE) The Xpert Xpress SARS-CoV-2/FLU/RSV plus assay is intended as an aid in the diagnosis of influenza from Nasopharyngeal swab specimens and should not be used as a sole basis for treatment. Nasal washings and aspirates are unacceptable for Xpert Xpress SARS-CoV-2/FLU/RSV testing.  Fact Sheet for Patients: EntrepreneurPulse.com.au  Fact Sheet for Healthcare Providers: IncredibleEmployment.be  This test is not yet approved or cleared by the [place] FDA and has been authorized for detection and/or diagnosis of SARS-CoV-2 by FDA under an Emergency Use Authorization (EUA). This EUA will remain in effect (meaning this test can be used) for the duration of the COVID-19 declaration under Section 564(b)(1) of the Act, 21 U.S.C. section 360bbb-3(b)(1), unless the authorization is terminated or revoked.     Resp Syncytial Virus by PCR NEGATIVE NEGATIVE Final    Comment: (NOTE) Fact Sheet for Patients: EntrepreneurPulse.com.au  Fact Sheet for Healthcare Providers: IncredibleEmployment.be  This test is not yet approved or cleared by the [place] FDA and has been authorized for detection and/or diagnosis of  SARS-CoV-2 by FDA under an Emergency Use Authorization (EUA). This EUA will remain in effect (meaning this test can be used) for the duration of the COVID-19 declaration under Section 564(b)(1) of the Act, 21 U.S.C. section 360bbb-3(b)(1), unless the authorization is terminated or revoked.  Performed at [facility], [address] [place], [place] [address]       Radiology Studies last 3 days: NM Pulmonary Perfusion  Result Date: 09/07/2022 CLINICAL DATA:  back pain, sob, hypoxia EXAM: NUCLEAR MEDICINE PERFUSION LUNG SCAN TECHNIQUE: Perfusion images were obtained in multiple projections after intravenous injection of radiopharmaceutical. Ventilation scans intentionally deferred if perfusion scan and chest x-ray adequate for interpretation during COVID 19 epidemic. RADIOPHARMACEUTICALS:  4.3 mCi Tc-77m MAA IV COMPARISON:  July 01, 2022 FINDINGS: Homogeneous distribution of radiotracer in bilateral lungs. No suspicious perfusion defect is identified. Cardiomegaly. Small RIGHT pleural effusion. IMPRESSION: Very low probability for pulmonary embolism by modified perfusion by modified perfusion only PIOPED criteria (PE absent). Electronically Signed   By: Valentino Saxon M.D.   On: 09/07/2022 16:05   DG Chest 2 View  Result Date: 09/07/2022 CLINICAL DATA:  Worsening shortness of breath for several days. EXAM: CHEST - 2 VIEW COMPARISON:  06/30/2022 FINDINGS: Stable cardiomegaly. New small to moderate right pleural effusion is seen with right basilar atelectasis. Left lung is clear. Multiple old left rib fracture deformities and thoracic dextroscoliosis remains stable. IMPRESSION: New small to moderate right pleural effusion and right basilar atelectasis. Stable cardiomegaly. Electronically Signed   By: Marlaine Hind M.D.   On: 09/07/2022 13:23             LOS: 1 day      Emeterio Reeve, DO [facility] 09/08/2022, 12:37 PM    Dictation software may have been  used to generate the above note. Typos may occur and escape review in typed/dictated notes. Please contact Dr Sheppard Coil directly for clarity if needed.  Staff may message me via secure chat in [place]  but this may not receive an immediate response,  please page me for urgent matters!  If 7PM-7AM, please contact night coverage [email or web address]

## 2022-09-09 DIAGNOSIS — J9601 Acute respiratory failure with hypoxia: Secondary | ICD-10-CM | POA: Diagnosis not present

## 2022-09-09 LAB — BASIC METABOLIC PANEL
Anion gap: 9 (ref 5–15)
BUN: 43 mg/dL — ABNORMAL HIGH (ref 8–23)
CO2: 22 mmol/L (ref 22–32)
Calcium: 9 mg/dL (ref 8.9–10.3)
Chloride: 103 mmol/L (ref 98–111)
Creatinine, Ser: 2.86 mg/dL — ABNORMAL HIGH (ref 0.44–1.00)
GFR, Estimated: 17 mL/min — ABNORMAL LOW (ref >60)
Glucose, Bld: 142 mg/dL — ABNORMAL HIGH (ref 70–99)
Potassium: 5.2 mmol/L — ABNORMAL HIGH (ref 3.5–5.1)
Sodium: 134 mmol/L — ABNORMAL LOW (ref 135–145)

## 2022-09-09 LAB — PROCALCITONIN: Procalcitonin: <0.1 ng/mL

## 2022-09-09 MED ORDER — SENNOSIDES-DOCUSATE SODIUM 8.6-50 MG PO TABS
2.0000 | ORAL_TABLET | Freq: Two times a day (BID) | ORAL | Status: AC
  Administered 2022-09-09 (×2): 2 via ORAL
  Filled 2022-09-09 (×2): qty 2

## 2022-09-09 MED ORDER — POLYETHYLENE GLYCOL 3350 17 G PO PACK
17.0000 g | PACK | Freq: Every day | ORAL | Status: DC
  Administered 2022-09-09 – 2022-09-10 (×2): 17 g via ORAL
  Filled 2022-09-09 (×2): qty 1

## 2022-09-09 NOTE — Consult Note (Signed)
   Heart Failure Nurse Navigator Note  HFpEF 50 to 55%.  Grade 2 diastolic dysfunction.  Left ventricular cavity mildly dilated.  Mild left atrial enlargement.  Mild mitral regurgitation.  She presented to the emergency room with complaints of 2 days of worsening shortness of breath, lower extremity edema and felt that her pants were fitting tighter.  She was last seen in the hospital on July 05, 2022.  BNP was 4096.  X-ray revealed stable cardiomegaly.  Small to moderate right pleural effusion.   Comorbidities:  COPD Atrial fibrillation Hyperlipidemia GERD Anxiety/depression Hypothyroidism Continued tobacco abuse  Medications:  Amiodarone 200 mg daily Apixaban 2.5 mg 2 times a day Atorvastatin 40 mg daily Diltiazem 120 mg daily Ferrous sulfate 325 mg daily Hydralazine 25 mg 3 times a day Levothyroxine 50 mcg daily NicoDerm patch  Lasix has been discontinued.  Labs:  Sodium 134, potassium 5.2, chloride 103, CO2 22, BUN 43, creatinine 2.86 up from 2.65 and 2.7 of 2 days ago, estimated GFR 17. Weight not documented Intake 300 mL Output not documented   Initial meeting with patient on this admission.  She states that she continues to reside with her husband and grandson.  She states that her husband retired as of June 26, 2022 and feels that some of the stress of taking care of the home has been lifted off of her.  She admits to not weighing herself daily.  Explained the importance of daily weights reporting 2 pound weight gain overnight or 5 pounds within the week.  Also discussed sodium restriction, she states that she does not use salt at the table and felt that she avoided foods that were higher in sodium.  In discussing fluid restriction she feels that she drinks approximately 3 L of water daily, explained that that is 1 L more than she should be taking in.  She needs to stick with the 64 ounces, 2 L or eight 8 ounce cups.  Discussed all that constitutes of  fluid.  Again discussed the [facility] program but patient does not have a smart phone nor does she have an updated computer or with a camera.  She had no further questions.  She has follow-up in the outpatient heart failure clinic on January 23 at [place]

## 2022-09-09 NOTE — Discharge Instructions (Signed)

## 2022-09-09 NOTE — Progress Notes (Signed)
PROGRESS NOTE    KIMA MALENFANT   ZOX:[id number] DOB: [birth year]  DOA: 09/07/2022 Date of Service: 09/09/22 PCP: [name], MD     Brief Narrative / Hospital Course:  Ms. Jessye Imhoff is a 67 year old female with history of chronic combined systolic/diastolic CHF, paroxysmal atrial fibrillation on Eliquis, CKD stage IV, hyperlipidemia, depression, hypothyroid, hypertension, GERD, copd, current tobacco use, anxiety, who presents emergency department for chief concerns of shortness of breath for 2 days. 01/13: SpO2 88% RA --> 96% 2L [place]. RR max 26. WBC 10.8, Hgb 10.7. Cr 2.7, BUN 35, Na 134, K 5.1. BNP 4000+. Procal <0.10. COVID/influenza A/influenza B/RSV PCR were negative. D-dimer ordered in triage was elevated at 5.96, VQ neg.  Initial high sensitive troponin was 14. Chest x-ray 2 view was read as new small moderate, right pleural effusion and right basilar atelectasis.  Stable cardiomegaly. ED treatment: Furosemide 60 mg IV one-time dose. Admitted to hospitalist service for treatment CHF.  01/14: remains on O2 this morning. VSS otherwise, RR improved. No output documented, but rales and wheezing on exam. Continue diuretics and nebs.  01/15: still no UOP documented. Cr up slightly, lungs clear no edema. Pt reports breathing better still DOE. 2-3L [place] O2  Consultants:  none  Procedures: none    ASSESSMENT & PLAN:   Principal Problem:   Acute hypoxemic respiratory failure (HCC) Active Problems:   Generalized weakness   Persistent atrial fibrillation (HCC)   CKD (chronic kidney disease), stage IV (HCC)   Hypertension   HLD (hyperlipidemia)   Tobacco use   GERD (gastroesophageal reflux disease)   History of alcohol abuse   Leukocytosis   Swelling of lower extremity   Anemia   SOB (shortness of breath)   (HFpEF) heart failure with preserved ejection fraction (HCC)   Positive D dimer   Depression with anxiety   Hypothyroidism  Acute hypoxemic respiratory failure (HCC)  d/t HFpEF exacerbation +/- COPD exacerbation Have reasonably r/o PE and pneumonia Presumed secondary to heart failure, with possible copd exacerbation complicated by tobacco use Continue oxygen supplementation to maintain SpO2 greater than 92% Underlying conditions as below  Acute on chronic (HFpEF) heart failure with preserved ejection fraction ([place]) echo on 07/01/2022 which was read as estimated ejection fraction 50-55 percent.  Grade 2 diastolic dysfunction. Diuresis Strict I&O Fluid restriction Will not repeat echo at this time  AKI D/t diuresis Dry today Hold diuretics today Repeat BMP am  COPD exacerbation Prednisone burst  Nebs May need home O2 will plan amb w/ pulse ox tm  CKD (chronic kidney disease), stage IV (HCC) baseline Question cardiorenal syndrome Monitor periodic BMP  Tobacco use As needed nicotine patch ordered  Persistent atrial fibrillation (HCC) Eliquis 2.5 mg p.o. twice daily renal dosing Carvedilol   HLD (hyperlipidemia) Atorvastatin 40 mg daily  GERD (gastroesophageal reflux disease) PPI   Hypertension Continue home Hydralazine 25 mg p.o. 3 times daily, diltiazem 120 mg p.o. daily  Labetalol 5 mg IV every 3 hours as needed for SBP greater than 175, 4 doses ordered and hydralazine 5 mg IV every 8 hours as needed for SBP greater than 175 starting on 09/08/2022 at 3 AM ordered  Depression with anxiety Continue home bupropion 150 mg daily, venlafaxine 150 mg daily  Hypothyroidism levothyroxine 50 mcg daily  Leukocytosis Neg procalcitonin Likely reactive Trend CBC  Positive D dimer Likely d/t inflammatory state VQ neg    DVT prophylaxis: Eliquis  Pertinent IV fluids/nutrition: no continuous IV fluids, fluid restriction d/t CHF  Central lines / invasive devices: none  Code Status: FULL CODE   Current Admission Status: inpatient   TOC needs / Dispo plan: no needs at this time, anticipate d/c back to previous home environment Barriers  to discharge / significant pending items: treatment CH, AKI, COPD. O2 requirement, anticipate d/c in 1-2 days w/ expected response to treatment              Subjective / Brief ROS:  Patient reports breathing a bit better form yesterday, better energy today Confirms not on O2 at home  Denies CP/SOB a trest, reports persistent SOB w/ activity Pain controlled.  Denies new weakness.  Tolerating diet.  Reports no concerns w/ urination/defecation.   Family Communication: none at this time     Objective Findings:  Vitals:   09/09/22 0906 09/09/22 1308 09/09/22 1309 09/09/22 1309  BP: (!) 128/97 119/77    Pulse: (!) 121 (!) 126 (!) 58 (!) 109  Resp: 18     Temp: 97.7 F (36.5 C)     TempSrc: Oral     SpO2: 97% 96% 95% 95%  Weight:      Height:        Intake/Output Summary (Last 24 hours) at 09/09/2022 1323 Last data filed at 09/08/2022 1846 Gross per 24 hour  Intake 300 ml  Output --  Net 300 ml   Filed Weights   09/07/22 1208  Weight: 58.5 kg    Examination:  Physical Exam Constitutional:      General: She is not in acute distress.    Appearance: She is well-developed.  Cardiovascular:     Rate and Rhythm: Tachycardia present. Rhythm irregular.  Pulmonary:     Breath sounds: Wheezing (more faint than yesterday) present. No decreased breath sounds or rales.  Musculoskeletal:     Right lower leg: No edema.     Left lower leg: No edema.  Skin:    General: Skin is warm and dry.  Neurological:     General: No focal deficit present.     Mental Status: She is alert and oriented to person, place, and time.  Psychiatric:        Mood and Affect: Mood normal.        Behavior: Behavior normal.          Scheduled Medications:   amiodarone  200 mg Oral Daily   apixaban  2.5 mg Oral BID   atorvastatin  40 mg Oral Daily   buPROPion  150 mg Oral Daily   diltiazem  120 mg Oral Daily   ferrous sulfate  325 mg Oral BID WC   fluticasone furoate-vilanterol  1  puff Inhalation Daily   And   umeclidinium bromide  1 puff Inhalation Daily   hydrALAZINE  25 mg Oral TID   levothyroxine  50 mcg Oral Q0600   pantoprazole  40 mg Oral Daily   predniSONE  40 mg Oral Q breakfast   venlafaxine XR  150 mg Oral Daily    Continuous Infusions:   PRN Medications:  acetaminophen **OR** acetaminophen, albuterol, hydrALAZINE, ipratropium-albuterol, labetalol, nicotine, ondansetron **OR** ondansetron (ZOFRAN) IV, senna-docusate  Antimicrobials from admission:  Anti-infectives (From admission, onward)    None           Data Reviewed:  I have personally reviewed the following...  CBC: Recent Labs  Lab 09/07/22 1212 09/08/22 0528  WBC 10.8* 8.6  NEUTROABS 8.5*  --   HGB 10.7* 10.0*  HCT 35.1* 32.8*  MCV 87.5  87.5  PLT 366 027   Basic Metabolic Panel: Recent Labs  Lab 09/07/22 1212 09/08/22 0528 09/09/22 0453  NA 134* 135 134*  K 5.1 5.2* 5.2*  CL 101 102 103  CO2 22 22 22   GLUCOSE 82 85 142*  BUN 35* 37* 43*  CREATININE 2.70* 2.65* 2.86*  CALCIUM 9.2 9.1 9.0   GFR: Estimated Creatinine Clearance: 17.2 mL/min (A) (by C-G formula based on SCr of 2.86 mg/dL (H)). Liver Function Tests: Recent Labs  Lab 09/07/22 1212  AST 29  ALT 24  ALKPHOS 144*  BILITOT 0.6  PROT 7.4  ALBUMIN 4.0   No results for input(s): "LIPASE", "AMYLASE" in the last 168 hours. No results for input(s): "AMMONIA" in the last 168 hours. Coagulation Profile: No results for input(s): "INR", "PROTIME" in the last 168 hours. Cardiac Enzymes: No results for input(s): "CKTOTAL", "CKMB", "CKMBINDEX", "TROPONINI" in the last 168 hours. BNP (last 3 results) No results for input(s): "PROBNP" in the last 8760 hours. HbA1C: No results for input(s): "HGBA1C" in the last 72 hours. CBG: No results for input(s): "GLUCAP" in the last 168 hours. Lipid Profile: No results for input(s): "CHOL", "HDL", "LDLCALC", "TRIG", "CHOLHDL", "LDLDIRECT" in the last 72  hours. Thyroid Function Tests: No results for input(s): "TSH", "T4TOTAL", "FREET4", "T3FREE", "THYROIDAB" in the last 72 hours. Anemia Panel: No results for input(s): "VITAMINB12", "FOLATE", "FERRITIN", "TIBC", "IRON", "RETICCTPCT" in the last 72 hours. Most Recent Urinalysis On File:     Component Value Date/Time   COLORURINE YELLOW (A) 03/25/2022 2210   APPEARANCEUR CLEAR (A) 03/25/2022 2210   LABSPEC 1.016 03/25/2022 2210   PHURINE 5.0 03/25/2022 2210   GLUCOSEU NEGATIVE 03/25/2022 2210   GLUCOSEU NEGATIVE 01/22/2017 1052   HGBUR NEGATIVE 03/25/2022 2210   BILIRUBINUR NEGATIVE 03/25/2022 2210   KETONESUR NEGATIVE 03/25/2022 2210   PROTEINUR >=300 (A) 03/25/2022 2210   UROBILINOGEN 0.2 01/22/2017 1052   NITRITE NEGATIVE 03/25/2022 2210   LEUKOCYTESUR NEGATIVE 03/25/2022 2210   Sepsis Labs: @LABRCNTIP (procalcitonin:4,lacticidven:4) Microbiology: Recent Results (from the past 240 hour(s))  Resp panel by RT-PCR (RSV, Flu A&B, Covid) Anterior Nasal Swab     Status: None   Collection Time: 09/07/22 12:24 PM   Specimen: Anterior Nasal Swab  Result Value Ref Range Status   SARS Coronavirus 2 by RT PCR NEGATIVE NEGATIVE Final    Comment: (NOTE) SARS-CoV-2 target nucleic acids are NOT DETECTED.  The SARS-CoV-2 RNA is generally detectable in upper respiratory specimens during the acute phase of infection. The lowest concentration of SARS-CoV-2 viral copies this assay can detect is 138 copies/mL. A negative result does not preclude SARS-Cov-2 infection and should not be used as the sole basis for treatment or other patient management decisions. A negative result may occur with  improper specimen collection/handling, submission of specimen other than nasopharyngeal swab, presence of viral mutation(s) within the areas targeted by this assay, and inadequate number of viral copies(<138 copies/mL). A negative result must be combined with clinical observations, patient history, and  epidemiological information. The expected result is Negative.  Fact Sheet for Patients:  EntrepreneurPulse.com.au  Fact Sheet for Healthcare Providers:  IncredibleEmployment.be  This test is no t yet approved or cleared by the [place] FDA and  has been authorized for detection and/or diagnosis of SARS-CoV-2 by FDA under an Emergency Use Authorization (EUA). This EUA will remain  in effect (meaning this test can be used) for the duration of the COVID-19 declaration under Section 564(b)(1) of the Act, 21 U.S.C.section 360bbb-3(b)(1), unless  the authorization is terminated  or revoked sooner.       Influenza A by PCR NEGATIVE NEGATIVE Final   Influenza B by PCR NEGATIVE NEGATIVE Final    Comment: (NOTE) The Xpert Xpress SARS-CoV-2/FLU/RSV plus assay is intended as an aid in the diagnosis of influenza from Nasopharyngeal swab specimens and should not be used as a sole basis for treatment. Nasal washings and aspirates are unacceptable for Xpert Xpress SARS-CoV-2/FLU/RSV testing.  Fact Sheet for Patients: EntrepreneurPulse.com.au  Fact Sheet for Healthcare Providers: IncredibleEmployment.be  This test is not yet approved or cleared by the [place] FDA and has been authorized for detection and/or diagnosis of SARS-CoV-2 by FDA under an Emergency Use Authorization (EUA). This EUA will remain in effect (meaning this test can be used) for the duration of the COVID-19 declaration under Section 564(b)(1) of the Act, 21 U.S.C. section 360bbb-3(b)(1), unless the authorization is terminated or revoked.     Resp Syncytial Virus by PCR NEGATIVE NEGATIVE Final    Comment: (NOTE) Fact Sheet for Patients: EntrepreneurPulse.com.au  Fact Sheet for Healthcare Providers: IncredibleEmployment.be  This test is not yet approved or cleared by the [place] FDA and has been  authorized for detection and/or diagnosis of SARS-CoV-2 by FDA under an Emergency Use Authorization (EUA). This EUA will remain in effect (meaning this test can be used) for the duration of the COVID-19 declaration under Section 564(b)(1) of the Act, 21 U.S.C. section 360bbb-3(b)(1), unless the authorization is terminated or revoked.  Performed at [facility], [address], [place], [place] [address]       Radiology Studies last 3 days: NM Pulmonary Perfusion  Result Date: 09/07/2022 CLINICAL DATA:  back pain, sob, hypoxia EXAM: NUCLEAR MEDICINE PERFUSION LUNG SCAN TECHNIQUE: Perfusion images were obtained in multiple projections after intravenous injection of radiopharmaceutical. Ventilation scans intentionally deferred if perfusion scan and chest x-ray adequate for interpretation during COVID 19 epidemic. RADIOPHARMACEUTICALS:  4.3 mCi Tc-28m MAA IV COMPARISON:  July 01, 2022 FINDINGS: Homogeneous distribution of radiotracer in bilateral lungs. No suspicious perfusion defect is identified. Cardiomegaly. Small RIGHT pleural effusion. IMPRESSION: Very low probability for pulmonary embolism by modified perfusion by modified perfusion only PIOPED criteria (PE absent). Electronically Signed   By: Valentino Saxon M.D.   On: 09/07/2022 16:05   DG Chest 2 View  Result Date: 09/07/2022 CLINICAL DATA:  Worsening shortness of breath for several days. EXAM: CHEST - 2 VIEW COMPARISON:  06/30/2022 FINDINGS: Stable cardiomegaly. New small to moderate right pleural effusion is seen with right basilar atelectasis. Left lung is clear. Multiple old left rib fracture deformities and thoracic dextroscoliosis remains stable. IMPRESSION: New small to moderate right pleural effusion and right basilar atelectasis. Stable cardiomegaly. Electronically Signed   By: Marlaine Hind M.D.   On: 09/07/2022 13:23             LOS: 2 days      Emeterio Reeve, DO [facility] 09/09/2022,  1:23 PM    Dictation software may have been used to generate the above note. Typos may occur and escape review in typed/dictated notes. Please contact Dr Sheppard Coil directly for clarity if needed.  Staff may message me via secure chat in [place]  but this may not receive an immediate response,  please page me for urgent matters!  If 7PM-7AM, please contact night coverage [email or web address]

## 2022-09-10 DIAGNOSIS — J9601 Acute respiratory failure with hypoxia: Secondary | ICD-10-CM | POA: Diagnosis not present

## 2022-09-10 LAB — BASIC METABOLIC PANEL
Anion gap: 7 (ref 5–15)
BUN: 57 mg/dL — ABNORMAL HIGH (ref 8–23)
CO2: 24 mmol/L (ref 22–32)
Calcium: 9 mg/dL (ref 8.9–10.3)
Chloride: 102 mmol/L (ref 98–111)
Creatinine, Ser: 2.72 mg/dL — ABNORMAL HIGH (ref 0.44–1.00)
GFR, Estimated: 19 mL/min — ABNORMAL LOW (ref >60)
Glucose, Bld: 113 mg/dL — ABNORMAL HIGH (ref 70–99)
Potassium: 5 mmol/L (ref 3.5–5.1)
Sodium: 133 mmol/L — ABNORMAL LOW (ref 135–145)

## 2022-09-10 MED ORDER — SENNOSIDES-DOCUSATE SODIUM 8.6-50 MG PO TABS
2.0000 | ORAL_TABLET | Freq: Two times a day (BID) | ORAL | Status: AC
  Administered 2022-09-10 – 2022-09-11 (×2): 2 via ORAL
  Filled 2022-09-10 (×2): qty 2

## 2022-09-10 MED ORDER — POLYETHYLENE GLYCOL 3350 17 G PO PACK
17.0000 g | PACK | Freq: Two times a day (BID) | ORAL | Status: AC
  Administered 2022-09-10 – 2022-09-11 (×2): 17 g via ORAL
  Filled 2022-09-10 (×2): qty 1

## 2022-09-10 NOTE — Plan of Care (Signed)
  Problem: Clinical Measurements: Goal: Diagnostic test results will improve Outcome: Progressing Goal: Respiratory complications will improve Outcome: Progressing   Problem: Activity: Goal: Risk for activity intolerance will decrease Outcome: Progressing   Problem: Nutrition: Goal: Adequate nutrition will be maintained Outcome: Progressing   Problem: Elimination: Goal: Will not experience complications related to bowel motility Outcome: Not Progressing

## 2022-09-10 NOTE — TOC Progression Note (Signed)
Transition of Care [facility]) - Progression Note    Patient Details  Name: Dawn Sherman MRN: [id number] Date of Birth: [birth year]  Transition of Care [facility]) CM/SW [place], LCSW Phone Number: 09/10/2022, 1:23 PM  Clinical Narrative:  Patient does not have a DME company preference if home oxygen is needed at discharge.   Expected Discharge Plan: Home/Self Care Barriers to Discharge: Continued Medical Work up  Expected Discharge Plan and Services       Living arrangements for the past 2 months: Single Family Home                                       Social Determinants of Health (SDOH) Interventions SDOH Screenings   Food Insecurity: No Food Insecurity (09/07/2022)  Housing: Low Risk  (09/07/2022)  Transportation Needs: No Transportation Needs (09/07/2022)  Utilities: Not At Risk (09/07/2022)  Depression (PHQ2-9): Low Risk  (08/14/2022)  Financial Resource Strain: Low Risk  (11/27/2021)  Physical Activity: Sufficiently Active (11/27/2021)  Social Connections: Unknown (11/27/2021)  Stress: No Stress Concern Present (11/27/2021)  Tobacco Use: High Risk (09/07/2022)    Readmission Risk Interventions    07/02/2022   12:26 PM 12/11/2021    3:17 PM  Readmission Risk Prevention Plan  Transportation Screening Complete Complete  PCP or Specialist Appt within 3-5 Days  Complete  HRI or [place]  Complete  Social Work Consult for [place] Planning/Counseling  Complete  Palliative Care Screening  Not Applicable  Medication Review Press photographer) Complete Complete  PCP or Specialist appointment within 3-5 days of discharge Complete   SW Recovery Care/Counseling Consult Complete   [place] Not Applicable

## 2022-09-10 NOTE — Progress Notes (Addendum)
SATURATION QUALIFICATIONS: (This note is used to comply with regulatory documentation for home oxygen)  Patient Saturations on Room Air at Rest = 92%  Patient Saturations on Room Air while Ambulating = 86%  Patient Saturations on 3 Liters of oxygen while Ambulating = 92%  Patient returned to room and sat at the side of  bed. Patient Saturations at 92% after returning to 2L.    Fuller Mandril, RN

## 2022-09-10 NOTE — Progress Notes (Signed)
PROGRESS NOTE    Dawn Sherman   RDE:[id number] DOB: [birth year]  DOA: 09/07/2022 Date of Service: 09/10/22 PCP: [name], MD     Brief Narrative / Hospital Course:  [name] is a 67 year old female with history of chronic combined systolic/diastolic CHF, paroxysmal atrial fibrillation on Eliquis, CKD stage IV, hyperlipidemia, depression, hypothyroid, hypertension, GERD, copd, current tobacco use, anxiety, who presents emergency department for chief concerns of shortness of breath for 2 days. 01/13: SpO2 88% RA --> 96% 2L [place]. RR max 26. WBC 10.8, Hgb 10.7. Cr 2.7, BUN 35, Na 134, K 5.1. BNP 4000+. Procal <0.10. COVID/influenza A/influenza B/RSV PCR were negative. D-dimer ordered in triage was elevated at 5.96, VQ neg.  Initial high sensitive troponin was 14. Chest x-ray 2 view was read as new small moderate, right pleural effusion and right basilar atelectasis.  Stable cardiomegaly. ED treatment: Furosemide 60 mg IV one-time dose. Admitted to hospitalist service for treatment CHF.  01/14: remains on O2 this morning. VSS otherwise, RR improved. No output documented, but rales and wheezing on exam. Continue diuretics and nebs.  01/15: still no UOP documented. Cr up slightly, lungs clear no edema. Pt reports breathing better still DOE. 2-3L [place] O2 01/16: SOB, desat w/ ambulation. Plan stay tonight and if still desat on ambulation tomorrow may consider d/c home w/ O2 vs remain inpatient to wean down further    Consultants:  none  Procedures: none    ASSESSMENT & PLAN:   Principal Problem:   Acute hypoxemic respiratory failure ([place]) Active Problems:   Generalized weakness   Persistent atrial fibrillation (HCC)   CKD (chronic kidney disease), stage IV (HCC)   Hypertension   HLD (hyperlipidemia)   Tobacco use   GERD (gastroesophageal reflux disease)   History of alcohol abuse   Leukocytosis   Swelling of lower extremity   Anemia   SOB (shortness of breath)   (HFpEF)  heart failure with preserved ejection fraction (HCC)   Positive D dimer   Depression with anxiety   Hypothyroidism  Acute hypoxemic respiratory failure (HCC) d/t HFpEF exacerbation +/- COPD exacerbation Have reasonably r/o PE and pneumonia Presumed secondary to heart failure, with possible copd exacerbation complicated by tobacco use Continue oxygen supplementation to maintain SpO2 greater than 92% Underlying conditions as below SOB, desat w/ ambulation. Plan stay tonight and if still desat on ambulation tomorrow may consider d/c home w/ O2 vs remain inpatient to wean down further   COPD exacerbation Prednisone burst  Nebs May need home O2 will plan amb w/ pulse ox tm  Acute on chronic (HFpEF) heart failure with preserved ejection fraction ([place]) echo on 07/01/2022 which was read as estimated ejection fraction 50-55 percent.  Grade 2 diastolic dysfunction. Diuresis Strict I&O Fluid restriction Will not repeat echo at this time  AKI - improved D/t diuresis Dry yesterday and today Hold diuretics today Repeat BMP am  CKD (chronic kidney disease), stage IV (HCC) baseline Question cardiorenal syndrome Monitor periodic BMP  Tobacco use As needed nicotine patch ordered  Persistent atrial fibrillation (HCC) Eliquis 2.5 mg p.o. twice daily renal dosing Carvedilol   HLD (hyperlipidemia) Atorvastatin 40 mg daily  GERD (gastroesophageal reflux disease) PPI   Hypertension Continue home Hydralazine 25 mg p.o. 3 times daily, diltiazem 120 mg p.o. daily  Labetalol 5 mg IV every 3 hours as needed for SBP greater than 175, 4 doses ordered and hydralazine 5 mg IV every 8 hours as needed for SBP greater than 175 starting  on 09/08/2022 at 3 AM ordered  Depression with anxiety Continue home bupropion 150 mg daily, venlafaxine 150 mg daily  Hypothyroidism levothyroxine 50 mcg daily  Leukocytosis Neg procalcitonin Likely reactive Trend CBC  Positive D dimer Likely d/t inflammatory  state VQ neg    DVT prophylaxis: Eliquis  Pertinent IV fluids/nutrition: no continuous IV fluids, fluid restriction d/t CHF Central lines / invasive devices: none  Code Status: FULL CODE   Current Admission Status: inpatient   TOC needs / Dispo plan: no needs at this time, anticipate d/c back to previous home environment Barriers to discharge / significant pending items: Plan stay tonight and if still desat on ambulation tomorrow may consider d/c home w/ O2 vs remain inpatient to wean down further              Subjective / Brief ROS:  Patient reports breathing stil feels tight Complaints of constipation  Confirms not on O2 at home  Denies CP/SOB a trest, reports persistent SOB w/ activity Pain controlled.  Denies new weakness.  Tolerating diet.  Reports no concerns w/ urination/defecation.   Family Communication: none at this time     Objective Findings:  Vitals:   09/10/22 0028 09/10/22 0353 09/10/22 0356 09/10/22 0813  BP: 117/70 93/79 93/69  (!) 117/106  Pulse: 88 64 63 96  Resp:   18 (!) 22  Temp:   97.7 F (36.5 C) 98.1 F (36.7 C)  TempSrc:   Oral Oral  SpO2:  94% 94% 96%  Weight:      Height:        Intake/Output Summary (Last 24 hours) at 09/10/2022 1542 Last data filed at 09/10/2022 0559 Gross per 24 hour  Intake 600 ml  Output --  Net 600 ml    Filed Weights   09/07/22 1208  Weight: 58.5 kg    Examination:  Physical Exam Constitutional:      General: She is not in acute distress.    Appearance: She is well-developed.  Cardiovascular:     Rate and Rhythm: Tachycardia present. Rhythm irregular.  Pulmonary:     Breath sounds: Wheezing (more faint than yesterday) present. No decreased breath sounds or rales.  Musculoskeletal:     Right lower leg: No edema.     Left lower leg: No edema.  Skin:    General: Skin is warm and dry.  Neurological:     General: No focal deficit present.     Mental Status: She is alert and oriented to  person, place, and time.  Psychiatric:        Mood and Affect: Mood normal.        Behavior: Behavior normal.          Scheduled Medications:   amiodarone  200 mg Oral Daily   apixaban  2.5 mg Oral BID   atorvastatin  40 mg Oral Daily   buPROPion  150 mg Oral Daily   diltiazem  120 mg Oral Daily   ferrous sulfate  325 mg Oral BID WC   fluticasone furoate-vilanterol  1 puff Inhalation Daily   And   umeclidinium bromide  1 puff Inhalation Daily   hydrALAZINE  25 mg Oral TID   levothyroxine  50 mcg Oral Q0600   pantoprazole  40 mg Oral Daily   polyethylene glycol  17 g Oral Daily   predniSONE  40 mg Oral Q breakfast   venlafaxine XR  150 mg Oral Daily    Continuous Infusions:   PRN Medications:  acetaminophen **OR** acetaminophen, albuterol, hydrALAZINE, labetalol, nicotine, ondansetron **OR** ondansetron (ZOFRAN) IV  Antimicrobials from admission:  Anti-infectives (From admission, onward)    None           Data Reviewed:  I have personally reviewed the following...  CBC: Recent Labs  Lab 09/07/22 1212 09/08/22 0528  WBC 10.8* 8.6  NEUTROABS 8.5*  --   HGB 10.7* 10.0*  HCT 35.1* 32.8*  MCV 87.5 87.5  PLT 366 338    Basic Metabolic Panel: Recent Labs  Lab 09/07/22 1212 09/08/22 0528 09/09/22 0453 09/10/22 0623  NA 134* 135 134* 133*  K 5.1 5.2* 5.2* 5.0  CL 101 102 103 102  CO2 22 22 22 24   GLUCOSE 82 85 142* 113*  BUN 35* 37* 43* 57*  CREATININE 2.70* 2.65* 2.86* 2.72*  CALCIUM 9.2 9.1 9.0 9.0    GFR: Estimated Creatinine Clearance: 18.1 mL/min (A) (by C-G formula based on SCr of 2.72 mg/dL (H)). Liver Function Tests: Recent Labs  Lab 09/07/22 1212  AST 29  ALT 24  ALKPHOS 144*  BILITOT 0.6  PROT 7.4  ALBUMIN 4.0    No results for input(s): "LIPASE", "AMYLASE" in the last 168 hours. No results for input(s): "AMMONIA" in the last 168 hours. Coagulation Profile: No results for input(s): "INR", "PROTIME" in the last 168  hours. Cardiac Enzymes: No results for input(s): "CKTOTAL", "CKMB", "CKMBINDEX", "TROPONINI" in the last 168 hours. BNP (last 3 results) No results for input(s): "PROBNP" in the last 8760 hours. HbA1C: No results for input(s): "HGBA1C" in the last 72 hours. CBG: No results for input(s): "GLUCAP" in the last 168 hours. Lipid Profile: No results for input(s): "CHOL", "HDL", "LDLCALC", "TRIG", "CHOLHDL", "LDLDIRECT" in the last 72 hours. Thyroid Function Tests: No results for input(s): "TSH", "T4TOTAL", "FREET4", "T3FREE", "THYROIDAB" in the last 72 hours. Anemia Panel: No results for input(s): "VITAMINB12", "FOLATE", "FERRITIN", "TIBC", "IRON", "RETICCTPCT" in the last 72 hours. Most Recent Urinalysis On File:     Component Value Date/Time   COLORURINE YELLOW (A) 03/25/2022 2210   APPEARANCEUR CLEAR (A) 03/25/2022 2210   LABSPEC 1.016 03/25/2022 2210   PHURINE 5.0 03/25/2022 2210   GLUCOSEU NEGATIVE 03/25/2022 2210   GLUCOSEU NEGATIVE 01/22/2017 1052   HGBUR NEGATIVE 03/25/2022 2210   BILIRUBINUR NEGATIVE 03/25/2022 2210   KETONESUR NEGATIVE 03/25/2022 2210   PROTEINUR >=300 (A) 03/25/2022 2210   UROBILINOGEN 0.2 01/22/2017 1052   NITRITE NEGATIVE 03/25/2022 2210   LEUKOCYTESUR NEGATIVE 03/25/2022 2210   Sepsis Labs: @LABRCNTIP (procalcitonin:4,lacticidven:4) Microbiology: Recent Results (from the past 240 hour(s))  Resp panel by RT-PCR (RSV, Flu A&B, Covid) Anterior Nasal Swab     Status: None   Collection Time: 09/07/22 12:24 PM   Specimen: Anterior Nasal Swab  Result Value Ref Range Status   SARS Coronavirus 2 by RT PCR NEGATIVE NEGATIVE Final    Comment: (NOTE) SARS-CoV-2 target nucleic acids are NOT DETECTED.  The SARS-CoV-2 RNA is generally detectable in upper respiratory specimens during the acute phase of infection. The lowest concentration of SARS-CoV-2 viral copies this assay can detect is 138 copies/mL. A negative result does not preclude SARS-Cov-2 infection  and should not be used as the sole basis for treatment or other patient management decisions. A negative result may occur with  improper specimen collection/handling, submission of specimen other than nasopharyngeal swab, presence of viral mutation(s) within the areas targeted by this assay, and inadequate number of viral copies(<138 copies/mL). A negative result must be combined with clinical observations, patient  history, and epidemiological information. The expected result is Negative.  Fact Sheet for Patients:  EntrepreneurPulse.com.au  Fact Sheet for Healthcare Providers:  IncredibleEmployment.be  This test is no t yet approved or cleared by the [place] FDA and  has been authorized for detection and/or diagnosis of SARS-CoV-2 by FDA under an Emergency Use Authorization (EUA). This EUA will remain  in effect (meaning this test can be used) for the duration of the COVID-19 declaration under Section 564(b)(1) of the Act, 21 U.S.C.section 360bbb-3(b)(1), unless the authorization is terminated  or revoked sooner.       Influenza A by PCR NEGATIVE NEGATIVE Final   Influenza B by PCR NEGATIVE NEGATIVE Final    Comment: (NOTE) The Xpert Xpress SARS-CoV-2/FLU/RSV plus assay is intended as an aid in the diagnosis of influenza from Nasopharyngeal swab specimens and should not be used as a sole basis for treatment. Nasal washings and aspirates are unacceptable for Xpert Xpress SARS-CoV-2/FLU/RSV testing.  Fact Sheet for Patients: EntrepreneurPulse.com.au  Fact Sheet for Healthcare Providers: IncredibleEmployment.be  This test is not yet approved or cleared by the [place] FDA and has been authorized for detection and/or diagnosis of SARS-CoV-2 by FDA under an Emergency Use Authorization (EUA). This EUA will remain in effect (meaning this test can be used) for the duration of the COVID-19 declaration  under Section 564(b)(1) of the Act, 21 U.S.C. section 360bbb-3(b)(1), unless the authorization is terminated or revoked.     Resp Syncytial Virus by PCR NEGATIVE NEGATIVE Final    Comment: (NOTE) Fact Sheet for Patients: EntrepreneurPulse.com.au  Fact Sheet for Healthcare Providers: IncredibleEmployment.be  This test is not yet approved or cleared by the [place] FDA and has been authorized for detection and/or diagnosis of SARS-CoV-2 by FDA under an Emergency Use Authorization (EUA). This EUA will remain in effect (meaning this test can be used) for the duration of the COVID-19 declaration under Section 564(b)(1) of the Act, 21 U.S.C. section 360bbb-3(b)(1), unless the authorization is terminated or revoked.  Performed at [facility], [address], [place],  [address]       Radiology Studies last 3 days: NM Pulmonary Perfusion  Result Date: 09/07/2022 CLINICAL DATA:  back pain, sob, hypoxia EXAM: NUCLEAR MEDICINE PERFUSION LUNG SCAN TECHNIQUE: Perfusion images were obtained in multiple projections after intravenous injection of radiopharmaceutical. Ventilation scans intentionally deferred if perfusion scan and chest x-ray adequate for interpretation during COVID 19 epidemic. RADIOPHARMACEUTICALS:  4.3 mCi Tc-54m MAA IV COMPARISON:  July 01, 2022 FINDINGS: Homogeneous distribution of radiotracer in bilateral lungs. No suspicious perfusion defect is identified. Cardiomegaly. Small RIGHT pleural effusion. IMPRESSION: Very low probability for pulmonary embolism by modified perfusion by modified perfusion only PIOPED criteria (PE absent). Electronically Signed   By: Valentino Saxon M.D.   On: 09/07/2022 16:05   DG Chest 2 View  Result Date: 09/07/2022 CLINICAL DATA:  Worsening shortness of breath for several days. EXAM: CHEST - 2 VIEW COMPARISON:  06/30/2022 FINDINGS: Stable cardiomegaly. New small to moderate right  pleural effusion is seen with right basilar atelectasis. Left lung is clear. Multiple old left rib fracture deformities and thoracic dextroscoliosis remains stable. IMPRESSION: New small to moderate right pleural effusion and right basilar atelectasis. Stable cardiomegaly. Electronically Signed   By: Marlaine Hind M.D.   On: 09/07/2022 13:23             LOS: 3 days      Emeterio Reeve, DO [facility] 09/10/2022, 3:42 PM  Dictation software may have been used to generate the above note. Typos may occur and escape review in typed/dictated notes. Please contact Dr Sheppard Coil directly for clarity if needed.  Staff may message me via secure chat in [place]  but this may not receive an immediate response,  please page me for urgent matters!  If 7PM-7AM, please contact night coverage [email or web address]

## 2022-09-10 NOTE — Care Management Important Message (Signed)
Important Message  Patient Details  Name: Dawn Sherman MRN: [id number] Date of Birth: 1956/07/09   Medicare Important Message Given:  Yes     [name] 09/10/2022, 11:51 AM

## 2022-09-11 DIAGNOSIS — J9601 Acute respiratory failure with hypoxia: Secondary | ICD-10-CM | POA: Diagnosis not present

## 2022-09-11 NOTE — Progress Notes (Addendum)
Patient Saturations on Room Air at Rest = 92%  Patient Saturations on [employer] while Ambulating = 85%  Patient Saturations on 3 Liters of oxygen while Ambulating = 92%  Fuller Mandril, RN  Agreed with above note. She needs home oxygen.

## 2022-09-11 NOTE — Progress Notes (Signed)
PROGRESS NOTE    Dawn Sherman  URK:[id number] DOB: 06/22/1956 DOA: 09/07/2022 PCP: [name], MD   Brief Narrative:  This 67 year old female with history of chronic combined systolic/diastolic CHF, paroxysmal atrial fibrillation on Eliquis, CKD stage IV, hyperlipidemia, depression, hypothyroidism, hypertension, GERD, copd, current tobacco use, anxiety, who presents emergency department for chief concerns of shortness of breath for 2 days. 01/13:  ED SpO2 88% RA --> 96% 2L [place]. RR max 26. WBC 10.8, Hgb 10.7. Cr 2.7, BUN 35, Na 134, K 5.1. BNP 4000+. Procal <0.10. COVID/influenza A/influenza B/RSV PCR were negative. D-dimer ordered in triage was elevated at 5.96, VQ neg.  Initial high sensitive troponin was 14. Chest x-ray 2 view was read as new small moderate, right pleural effusion and right basilar atelectasis.  Stable cardiomegaly.  ED treatment: Furosemide 60 mg IV one-time dose. Admitted to hospitalist service for treatment CHF.  01/14: remains on O2 this morning. VSS otherwise, RR improved. No output documented, but rales and wheezing on exam. Continue diuretics and nebs.  01/15: still no UOP documented. Cr up slightly, lungs clear no edema. Pt reports breathing better still DOE. 2-3L [place] O2 01/17: SOB, desat w/ ambulation. Plan stay tonight and if still desat on ambulation tomorrow may consider d/c home w/ O2 vs remain inpatient to wean down further.  Assessment & Plan:   Principal Problem:   Acute hypoxemic respiratory failure (HCC) Active Problems:   Generalized weakness   Persistent atrial fibrillation (HCC)   CKD (chronic kidney disease), stage IV (HCC)   Hypertension   HLD (hyperlipidemia)   Tobacco use   GERD (gastroesophageal reflux disease)   History of alcohol abuse   Leukocytosis   Swelling of lower extremity   Anemia   SOB (shortness of breath)   (HFpEF) heart failure with preserved ejection fraction (HCC)   Positive D dimer   Depression with anxiety    Hypothyroidism  Acute hypoxic respiratory failure, multi factorial: Presumed secondary to heart failure with possible COPD exacerbation complicated by tobacco use. VQ scan showed no evidence of PE. Continue oxygen supplementation to maintain SpO2 greater than 92% SOB, desat w/ ambulation. Plan stay tonight and if still desat on ambulation tomorrow may consider d/c home w/ O2 vs remain inpatient to wean down further.   COPD Exacerbation: Continue Prednisone 40 mg daily,  Continue nebulized bronchodilators scheduled and as needed. May need home O2 will plan amb w/ pulse ox    Acute on chronic heart failure with preserved EF: Echo on 07/01/2022 which was read as estimated ejection fraction 50-55 %.  Grade 2 diastolic dysfunction. Continue diuresis.  Monitor intake output charting. Fluid restriction, appears euvolemic now. No need to repeat echo at this time.   AKI on CKD stage IV: Likely due to diuresis.  Patient appears euvolemic now. Discontinue diuretics.  Serum creatinine back to baseline.   Tobacco use Counseling completed.  Nicotine patch.   Persistent atrial fibrillation. Heart rate is controlled.  Continue Coreg. Continue Eliquis 2.5 mg twice daily.  Hyperlipidemia: Continue Lipitor 40 mg daily   GERD: Continue pantoprazole 40 mg daily.  Essential Hypertension Continue home Hydralazine 25 mg p.o. 3 times daily, diltiazem 120 mg p.o. daily  Labetalol 5 mg IV every 3 hours as needed for SBP greater than 175, Continue  hydralazine 5 mg IV every 8 hours as needed for SBP greater than 175 starting on 09/08/2022 at 3 AM ordered   Depression with anxiety Continue home bupropion 150 mg daily, venlafaxine 150  mg daily   Hypothyroidism Continue levothyroxine 50 mcg daily   Leukocytosis Neg procalcitonin Likely reactive Trend CBC   Positive D dimer: Likely d/t inflammatory state VQ negative    DVT prophylaxis: Eliquis Code Status:  Full code. Family Communication:  No family at bed side. Disposition Plan:    Status is: Inpatient Remains inpatient appropriate because: Admitted for acute hypoxic respiratory failure secondary to COPD and combined CHF exacerbation.    Consultants:  None  Procedures:  None Antimicrobials:  Anti-infectives (From admission, onward)    None       Subjective: Patient is seen and examined at bedside.  Overnight events noted. Patient reports doing better.  She still remains short of breath while ambulation. She denies any chest pain or weakness.  Objective: Vitals:   09/10/22 2009 09/10/22 2339 09/11/22 0404 09/11/22 0806  BP: 126/88 (!) 139/90 120/82 102/80  Pulse: 98 (!) 114 (!) 105 69  Resp: 20  18 17   Temp: 98.1 F (36.7 C)  (!) 97.4 F (36.3 C) 97.8 F (36.6 C)  TempSrc:      SpO2: 95%  95% 93%  Weight:      Height:        Intake/Output Summary (Last 24 hours) at 09/11/2022 1416 Last data filed at 09/11/2022 0600 Gross per 24 hour  Intake 780 ml  Output 3 ml  Net 777 ml   Filed Weights   09/07/22 1208  Weight: 58.5 kg    Examination:  General exam: Appears comfortable, not in any acute distress.  Deconditioned. Respiratory system: CTA bilaterally, respiratory for normal, RR 14. Cardiovascular system: S1 & S2 heard, regular rate and rhythm no murmur. Gastrointestinal system: Abdomen is soft, non tender, non distended, BS+ Central nervous system: Alert and oriented x 3. No focal neurological deficits. Extremities: No edema, no cyanosis, no clubbing. Skin: No rashes, lesions or ulcers Psychiatry: Judgement and insight appear normal. Mood & affect appropriate.     Data Reviewed: I have personally reviewed following labs and imaging studies  CBC: Recent Labs  Lab 09/07/22 1212 09/08/22 0528  WBC 10.8* 8.6  NEUTROABS 8.5*  --   HGB 10.7* 10.0*  HCT 35.1* 32.8*  MCV 87.5 87.5  PLT 366 161   Basic Metabolic Panel: Recent Labs  Lab 09/07/22 1212 09/08/22 0528 09/09/22 0453  09/10/22 0623  NA 134* 135 134* 133*  K 5.1 5.2* 5.2* 5.0  CL 101 102 103 102  CO2 22 22 22 24   GLUCOSE 82 85 142* 113*  BUN 35* 37* 43* 57*  CREATININE 2.70* 2.65* 2.86* 2.72*  CALCIUM 9.2 9.1 9.0 9.0   GFR: Estimated Creatinine Clearance: 18.1 mL/min (A) (by C-G formula based on SCr of 2.72 mg/dL (H)). Liver Function Tests: Recent Labs  Lab 09/07/22 1212  AST 29  ALT 24  ALKPHOS 144*  BILITOT 0.6  PROT 7.4  ALBUMIN 4.0   No results for input(s): "LIPASE", "AMYLASE" in the last 168 hours. No results for input(s): "AMMONIA" in the last 168 hours. Coagulation Profile: No results for input(s): "INR", "PROTIME" in the last 168 hours. Cardiac Enzymes: No results for input(s): "CKTOTAL", "CKMB", "CKMBINDEX", "TROPONINI" in the last 168 hours. BNP (last 3 results) No results for input(s): "PROBNP" in the last 8760 hours. HbA1C: No results for input(s): "HGBA1C" in the last 72 hours. CBG: No results for input(s): "GLUCAP" in the last 168 hours. Lipid Profile: No results for input(s): "CHOL", "HDL", "LDLCALC", "TRIG", "CHOLHDL", "LDLDIRECT" in the last 72 hours.  Thyroid Function Tests: No results for input(s): "TSH", "T4TOTAL", "FREET4", "T3FREE", "THYROIDAB" in the last 72 hours. Anemia Panel: No results for input(s): "VITAMINB12", "FOLATE", "FERRITIN", "TIBC", "IRON", "RETICCTPCT" in the last 72 hours. Sepsis Labs: Recent Labs  Lab 09/07/22 1416 09/08/22 0528 09/09/22 0453  PROCALCITON <0.10 <0.10 <0.10    Recent Results (from the past 240 hour(s))  Resp panel by RT-PCR (RSV, Flu A&B, Covid) Anterior Nasal Swab     Status: None   Collection Time: 09/07/22 12:24 PM   Specimen: Anterior Nasal Swab  Result Value Ref Range Status   SARS Coronavirus 2 by RT PCR NEGATIVE NEGATIVE Final    Comment: (NOTE) SARS-CoV-2 target nucleic acids are NOT DETECTED.  The SARS-CoV-2 RNA is generally detectable in upper respiratory specimens during the acute phase of infection. The  lowest concentration of SARS-CoV-2 viral copies this assay can detect is 138 copies/mL. A negative result does not preclude SARS-Cov-2 infection and should not be used as the sole basis for treatment or other patient management decisions. A negative result may occur with  improper specimen collection/handling, submission of specimen other than nasopharyngeal swab, presence of viral mutation(s) within the areas targeted by this assay, and inadequate number of viral copies(<138 copies/mL). A negative result must be combined with clinical observations, patient history, and epidemiological information. The expected result is Negative.  Fact Sheet for Patients:  EntrepreneurPulse.com.au  Fact Sheet for Healthcare Providers:  IncredibleEmployment.be  This test is no t yet approved or cleared by the [place] FDA and  has been authorized for detection and/or diagnosis of SARS-CoV-2 by FDA under an Emergency Use Authorization (EUA). This EUA will remain  in effect (meaning this test can be used) for the duration of the COVID-19 declaration under Section 564(b)(1) of the Act, 21 U.S.C.section 360bbb-3(b)(1), unless the authorization is terminated  or revoked sooner.       Influenza A by PCR NEGATIVE NEGATIVE Final   Influenza B by PCR NEGATIVE NEGATIVE Final    Comment: (NOTE) The Xpert Xpress SARS-CoV-2/FLU/RSV plus assay is intended as an aid in the diagnosis of influenza from Nasopharyngeal swab specimens and should not be used as a sole basis for treatment. Nasal washings and aspirates are unacceptable for Xpert Xpress SARS-CoV-2/FLU/RSV testing.  Fact Sheet for Patients: EntrepreneurPulse.com.au  Fact Sheet for Healthcare Providers: IncredibleEmployment.be  This test is not yet approved or cleared by the [place] FDA and has been authorized for detection and/or diagnosis of SARS-CoV-2 by FDA under  an Emergency Use Authorization (EUA). This EUA will remain in effect (meaning this test can be used) for the duration of the COVID-19 declaration under Section 564(b)(1) of the Act, 21 U.S.C. section 360bbb-3(b)(1), unless the authorization is terminated or revoked.     Resp Syncytial Virus by PCR NEGATIVE NEGATIVE Final    Comment: (NOTE) Fact Sheet for Patients: EntrepreneurPulse.com.au  Fact Sheet for Healthcare Providers: IncredibleEmployment.be  This test is not yet approved or cleared by the [place] FDA and has been authorized for detection and/or diagnosis of SARS-CoV-2 by FDA under an Emergency Use Authorization (EUA). This EUA will remain in effect (meaning this test can be used) for the duration of the COVID-19 declaration under Section 564(b)(1) of the Act, 21 U.S.C. section 360bbb-3(b)(1), unless the authorization is terminated or revoked.  Performed at [facility], [address] [place], [place] [address]    Radiology Studies: No results found.  Scheduled Meds:  amiodarone  200 mg Oral Daily   apixaban  2.5 mg Oral BID   atorvastatin  40 mg Oral Daily   buPROPion  150 mg Oral Daily   diltiazem  120 mg Oral Daily   ferrous sulfate  325 mg Oral BID WC   fluticasone furoate-vilanterol  1 puff Inhalation Daily   And   umeclidinium bromide  1 puff Inhalation Daily   hydrALAZINE  25 mg Oral TID   levothyroxine  50 mcg Oral Q0600   pantoprazole  40 mg Oral Daily   predniSONE  40 mg Oral Q breakfast   venlafaxine XR  150 mg Oral Daily   Continuous Infusions:   LOS: 4 days    Time spent: 50 mins    Breindy Meadow, MD [facility]   If 7PM-7AM, please contact night-coverage

## 2022-09-11 NOTE — Plan of Care (Signed)
  Problem: Clinical Measurements: Goal: Respiratory complications will improve Outcome: Progressing   Problem: Activity: Goal: Risk for activity intolerance will decrease Outcome: Progressing

## 2022-09-12 ENCOUNTER — Telehealth: Payer: Self-pay | Admitting: Internal Medicine

## 2022-09-12 DIAGNOSIS — J9601 Acute respiratory failure with hypoxia: Secondary | ICD-10-CM | POA: Diagnosis not present

## 2022-09-12 MED ORDER — AMIODARONE HCL 200 MG PO TABS: 200.0000 mg | ORAL_TABLET | Freq: Every day | ORAL | 1 refills | Status: DC

## 2022-09-12 MED ORDER — HYDRALAZINE HCL 50 MG PO TABS: 50.0000 mg | ORAL_TABLET | Freq: Three times a day (TID) | ORAL | Status: DC

## 2022-09-12 NOTE — Plan of Care (Signed)
Patient to be discharged today, awaiting home oxygen.  Home medication list reviewed

## 2022-09-12 NOTE — TOC Progression Note (Signed)
Transition of Care [facility]) - Progression Note    Patient Details  Name: Dawn Sherman MRN: [id number] Date of Birth: November 08, 1955  Transition of Care [facility]) CM/SW [place], LCSW Phone Number: 09/12/2022, 11:04 AM  Clinical Narrative:  Ordered home oxygen through [employer].   Expected Discharge Plan: Home/Self Care Barriers to Discharge: Continued Medical Work up  Expected Discharge Plan and Services       Living arrangements for the past 2 months: Single Family Home                                       Social Determinants of Health (SDOH) Interventions SDOH Screenings   Food Insecurity: No Food Insecurity (09/07/2022)  Housing: Low Risk  (09/07/2022)  Transportation Needs: No Transportation Needs (09/07/2022)  Utilities: Not At Risk (09/07/2022)  Depression (PHQ2-9): Low Risk  (08/14/2022)  Financial Resource Strain: Low Risk  (11/27/2021)  Physical Activity: Sufficiently Active (11/27/2021)  Social Connections: Unknown (11/27/2021)  Stress: No Stress Concern Present (11/27/2021)  Tobacco Use: High Risk (09/07/2022)    Readmission Risk Interventions    07/02/2022   12:26 PM 12/11/2021    3:17 PM  Readmission Risk Prevention Plan  Transportation Screening Complete Complete  PCP or Specialist Appt within 3-5 Days  Complete  HRI or [place]  Complete  Social Work Consult for [place] Planning/Counseling  Complete  Palliative Care Screening  Not Applicable  Medication Review Press photographer) Complete Complete  PCP or Specialist appointment within 3-5 days of discharge Complete   SW Recovery Care/Counseling Consult Complete   [place] Not Applicable

## 2022-09-12 NOTE — Telephone Encounter (Signed)
Lm for pt to cb - can sched for 2/8 at 4pm  Will hold for cancellations to try to work pt in sooner.

## 2022-09-12 NOTE — Discharge Summary (Addendum)
Physician Discharge Summary  Dawn Sherman TGG:[id number] DOB: [birth year] DOA: 09/07/2022  PCP: [name], MD  Admit date: 09/07/2022  Discharge date: 09/12/2022  Admitted From: Home.  Disposition:  Home.  Recommendations for Outpatient Follow-up:  Follow up with PCP in 1-2 weeks. Please obtain BMP/CBC in one week Advised to follow-up with Cardiology as scheduled. Patient is being discharged home on home oxygen.  Home Health: None Equipment/Devices: Home Oxygen @ 3l/min  Discharge Condition: Stable CODE STATUS:Full code Diet recommendation: Heart Healthy   Brief [facility] Course: This 67 year old female with history of chronic combined systolic/diastolic CHF, paroxysmal atrial fibrillation on Eliquis, CKD stage IV, hyperlipidemia, depression, hypothyroidism, hypertension, GERD, copd, current tobacco use, anxiety, who presents to the emergency department for chief concerns of shortness of breath for 2 days. 01/13:  ED SpO2 88% RA --> 96% 2L [place]. RR max 26. WBC 10.8, Hgb 10.7. Cr 2.7, BUN 35, Na 134, K 5.1. BNP 4000+. Procal <0.10. COVID/influenza A/influenza B/RSV PCR were negative. D-dimer ordered in triage was elevated at 5.96, VQ neg.  Initial high sensitive troponin was 14. Chest x-ray 2 view was read as new small moderate, right pleural effusion and right basilar atelectasis.  Stable cardiomegaly.  ED treatment: Furosemide 60 mg IV one-time dose. Admitted to hospitalist service for treatment CHF.  01/14: remains on O2 this morning. VSS otherwise, RR improved. No output documented, but rales and wheezing on exam. Continue diuretics and nebs.  01/15: still no UOP documented. Cr up slightly, lungs clear no edema. Pt reports breathing better still DOE. 2-3L [place] O2 01/17: SOB, desat w/ ambulation.  01/18: Patient felt much better.  She ambulated in her O2 saturation dropped.  Requiring 3 L of oxygen.  Oxygen arranged.  Patient is being discharged home.  Discharge Diagnoses:   Principal Problem:   Acute hypoxemic respiratory failure (HCC) Active Problems:   Generalized weakness   Persistent atrial fibrillation (HCC)   CKD (chronic kidney disease), stage IV (HCC)   Hypertension   HLD (hyperlipidemia)   Tobacco use   GERD (gastroesophageal reflux disease)   History of alcohol abuse   Leukocytosis   Swelling of lower extremity   Anemia   SOB (shortness of breath)   (HFpEF) heart failure with preserved ejection fraction (HCC)   Positive D dimer   Depression with anxiety   Hypothyroidism  Acute hypoxic respiratory failure, multi factorial: Presumed secondary to heart failure with possible COPD exacerbation complicated by tobacco use. VQ scan showed no evidence of PE. Continue oxygen supplementation to maintain SpO2 greater than 92% SOB, desat w/ ambulation.Patient failed SpO2 while ambulation.  She qualifies for home oxygen.  Home oxygen arranged.    COPD Exacerbation: Continue Prednisone 40 mg daily,  Continue nebulized bronchodilators scheduled and as needed. Home oxygen arranged.    Acute on chronic heart failure with preserved EF: Echo on 07/01/2022 which was read as estimated ejection fraction 50-55 %.  Grade 2 diastolic dysfunction. Continue diuresis.  Monitor intake output charting. Fluid restriction, appears euvolemic now. No need to repeat echo at this time.   AKI on CKD stage IV: Likely due to diuresis.  Patient appears euvolemic now. Discontinued diuretics.  Serum creatinine back to baseline.   Tobacco use Counseling completed.  Nicotine patch.   Persistent atrial fibrillation. Heart rate is controlled.  Continue Cardizem Continue Eliquis 2.5 mg twice daily.   Hyperlipidemia: Continue Lipitor 40 mg daily   GERD: Continue pantoprazole 40 mg daily.   Essential Hypertension Continue home  Hydralazine 25 mg p.o. 3 times daily, diltiazem 120 mg p.o. daily   Depression with anxiety Continue home bupropion 150 mg daily, venlafaxine  150 mg daily   Hypothyroidism Continue levothyroxine 50 mcg daily   Leukocytosis Neg procalcitonin Likely reactive Trend CBC   Positive D dimer: Likely d/t inflammatory state VQ negative  Discharge Instructions  Discharge Instructions     Call MD for:  difficulty breathing, headache or visual disturbances   Complete by: As directed    Call MD for:  persistant dizziness or light-headedness   Complete by: As directed    Call MD for:  persistant nausea and vomiting   Complete by: As directed    Diet - low sodium heart healthy   Complete by: As directed    Diet Carb Modified   Complete by: As directed    Discharge instructions   Complete by: As directed    Advised to follow-up with primary care physician in 1 week. Advised to follow-up with cardiology as scheduled. Patient is being discharged home on home oxygen.   Increase activity slowly   Complete by: As directed       Allergies as of 09/12/2022       Reactions   Sulfate Rash   Codeine Sulfate Nausea Only   Benicar [olmesartan]    Talked with patient February 10, 2020, intolerance is unclear, tried several medications around that time and one of them gave her a rash but she is not clear which 1.   Amoxicillin Rash   Clindamycin/lincomycin Rash   Entresto [sacubitril-valsartan] Other (See Comments)   hyperkalemia   Morphine And Related Rash   Penicillins Rash        Medication List     TAKE these medications    albuterol 108 (90 Base) MCG/ACT inhaler Commonly known as: VENTOLIN HFA Inhale 2 puffs into the lungs every 6 (six) hours as needed for wheezing or shortness of breath.   amiodarone 200 MG tablet Commonly known as: PACERONE Take 1 tablet (200 mg total) by mouth daily. What changed:  how much to take how to take this when to take this additional instructions   apixaban 2.5 MG Tabs tablet Commonly known as: ELIQUIS Take 1 tablet (2.5 mg total) by mouth 2 (two) times daily.   atorvastatin 40  MG tablet Commonly known as: LIPITOR TAKE ONE TABLET BY MOUTH EVERY DAY   buPROPion 150 MG 24 hr tablet Commonly known as: WELLBUTRIN XL TAKE ONE TABLET BY MOUTH EVERY DAY   calcium carbonate 1250 (500 Ca) MG tablet Commonly known as: OS-CAL - dosed in mg of elemental calcium Take 1 tablet (500 mg of elemental calcium total) by mouth 3 (three) times daily with meals.   diltiazem 120 MG 24 hr capsule Commonly known as: CARDIZEM CD Take 1 capsule (120 mg total) by mouth daily.   Farxiga 10 MG Tabs tablet Generic drug: dapagliflozin propanediol Take 10 mg by mouth daily.   ferrous sulfate 325 (65 FE) MG tablet Take 1 tablet (325 mg total) by mouth 2 (two) times daily with a meal.   furosemide 40 MG tablet Commonly known as: LASIX Take 1 tablet (40 mg total) by mouth daily. Take 40 mg daily and and extra 40 mg as needed for fluid retention   hydrALAZINE 25 MG tablet Commonly known as: APRESOLINE Take 1 tablet (25 mg total) by mouth 3 (three) times daily.   levothyroxine 50 MCG tablet Commonly known as: SYNTHROID Take 1 tablet (50 mcg  total) by mouth daily.   loratadine 10 MG tablet Commonly known as: CLARITIN Take 10 mg by mouth daily.   omeprazole 20 MG capsule Commonly known as: PRILOSEC Take 20 mg by mouth daily.   Trelegy Ellipta 100-62.5-25 MCG/ACT Aepb Generic drug: Fluticasone-Umeclidin-Vilant Inhale 1 puff into the lungs daily.   venlafaxine XR 150 MG 24 hr capsule Commonly known as: EFFEXOR-XR TAKE ONE CAPSULE BY MOUTH EVERY DAY               Durable Medical Equipment  (From admission, onward)           Start     Ordered   09/12/22 1052  For home use only DME oxygen  Once       Question Answer Comment  Length of Need Lifetime   Mode or (Route) Nasal cannula   Liters per Minute 3   Frequency Continuous (stationary and portable oxygen unit needed)   Oxygen conserving device Yes   Oxygen delivery system Gas      09/12/22 1052             Follow-up Information     [name], MD Follow up in 1 week(s).   Specialty: Internal Medicine Why: Office will need to work patient in. Will call patient with appointment. Contact information: [address] [place] [address] [phone]                Allergies  Allergen Reactions   Sulfate Rash   Codeine Sulfate Nausea Only   Benicar [Olmesartan]     Talked with patient February 10, 2020, intolerance is unclear, tried several medications around that time and one of them gave her a rash but she is not clear which 1.   Amoxicillin Rash   Clindamycin/Lincomycin Rash   Entresto [Sacubitril-Valsartan] Other (See Comments)    hyperkalemia   Morphine And Related Rash   Penicillins Rash    Consultations: None  Procedures/Studies: NM Pulmonary Perfusion  Result Date: 09/07/2022 CLINICAL DATA:  back pain, sob, hypoxia EXAM: NUCLEAR MEDICINE PERFUSION LUNG SCAN TECHNIQUE: Perfusion images were obtained in multiple projections after intravenous injection of radiopharmaceutical. Ventilation scans intentionally deferred if perfusion scan and chest x-ray adequate for interpretation during COVID 19 epidemic. RADIOPHARMACEUTICALS:  4.3 mCi Tc-46m MAA IV COMPARISON:  July 01, 2022 FINDINGS: Homogeneous distribution of radiotracer in bilateral lungs. No suspicious perfusion defect is identified. Cardiomegaly. Small RIGHT pleural effusion. IMPRESSION: Very low probability for pulmonary embolism by modified perfusion by modified perfusion only PIOPED criteria (PE absent). Electronically Signed   By: Valentino Saxon M.D.   On: 09/07/2022 16:05   DG Chest 2 View  Result Date: 09/07/2022 CLINICAL DATA:  Worsening shortness of breath for several days. EXAM: CHEST - 2 VIEW COMPARISON:  06/30/2022 FINDINGS: Stable cardiomegaly. New small to moderate right pleural effusion is seen with right basilar atelectasis. Left lung is clear. Multiple old left rib  fracture deformities and thoracic dextroscoliosis remains stable. IMPRESSION: New small to moderate right pleural effusion and right basilar atelectasis. Stable cardiomegaly. Electronically Signed   By: Marlaine Hind M.D.   On: 09/07/2022 13:23   US Abdomen Complete  Result Date: 08/30/2022 CLINICAL DATA:  Epigastric pain for 1 week. EXAM: ABDOMEN ULTRASOUND COMPLETE COMPARISON:  CT abdomen pelvis dated 12/10/2021 and abdominal ultrasound dated 12/21/2018. FINDINGS: Gallbladder: Two gallbladder wall polyps measure up to 5 mm in size. Given the small size, these are likely benign and no imaging follow-up is recommended for this  finding. No gallstones or wall thickening visualized. No sonographic Murphy sign noted by sonographer. Common bile duct: Diameter: 4 mm Liver: No focal lesion identified. Within normal limits in parenchymal echogenicity. Portal vein is patent on color Doppler imaging with normal direction of blood flow towards the liver. IVC: No abnormality visualized. Pancreas: Visualized portion unremarkable. Spleen: Size and appearance within normal limits. Right Kidney: Length: 5.7 cm. Right kidney is atrophic. There is increased renal cortical echogenicity. No mass or hydronephrosis visualized. Left Kidney: Length: 10.0 cm. There is increased renal cortical echogenicity. No mass or hydronephrosis visualized. Abdominal aorta: No aneurysm visualized. Other findings: There is a questionable right pleural effusion. IMPRESSION: 1. No acute findings. No evidence of cholelithiasis or acute cholecystitis. 2. Findings of chronic medical renal disease with atrophic right kidney. 3. Questionable right pleural effusion. Electronically Signed   By: Zerita Boers M.D.   On: 08/30/2022 09:30     Subjective: Patient was seen and examined at bedside.  Overnight events noted.  Patient report doing much better.   Patient is being discharged home, oxygen arranged.  Discharge Exam: Vitals:   09/12/22 0416 09/12/22  0839  BP: (!) 143/100 (!) 146/69  Pulse: (!) 109 87  Resp: 18 19  Temp: 97.6 F (36.4 C) (!) 97.3 F (36.3 C)  SpO2: 90% 92%   Vitals:   09/11/22 1547 09/11/22 1938 09/12/22 0416 09/12/22 0839  BP: 135/82 (!) 131/91 (!) 143/100 (!) 146/69  Pulse: 95 (!) 108 (!) 109 87  Resp: 17 20 18 19   Temp: 98.6 F (37 C) 98.2 F (36.8 C) 97.6 F (36.4 C) (!) 97.3 F (36.3 C)  TempSrc: Oral Oral Oral   SpO2: 94% 95% 90% 92%  Weight:      Height:        General: Pt is alert, awake, not in acute distress Cardiovascular: RRR, S1/S2 +, no rubs, no gallops Respiratory: CTA bilaterally, no wheezing, no rhonchi Abdominal: Soft, NT, ND, bowel sounds + Extremities: no edema, no cyanosis    The results of significant diagnostics from this hospitalization (including imaging, microbiology, ancillary and laboratory) are listed below for reference.     Microbiology: Recent Results (from the past 240 hour(s))  Resp panel by RT-PCR (RSV, Flu A&B, Covid) Anterior Nasal Swab     Status: None   Collection Time: 09/07/22 12:24 PM   Specimen: Anterior Nasal Swab  Result Value Ref Range Status   SARS Coronavirus 2 by RT PCR NEGATIVE NEGATIVE Final    Comment: (NOTE) SARS-CoV-2 target nucleic acids are NOT DETECTED.  The SARS-CoV-2 RNA is generally detectable in upper respiratory specimens during the acute phase of infection. The lowest concentration of SARS-CoV-2 viral copies this assay can detect is 138 copies/mL. A negative result does not preclude SARS-Cov-2 infection and should not be used as the sole basis for treatment or other patient management decisions. A negative result may occur with  improper specimen collection/handling, submission of specimen other than nasopharyngeal swab, presence of viral mutation(s) within the areas targeted by this assay, and inadequate number of viral copies(<138 copies/mL). A negative result must be combined with clinical observations, patient history, and  epidemiological information. The expected result is Negative.  Fact Sheet for Patients:  EntrepreneurPulse.com.au  Fact Sheet for Healthcare Providers:  IncredibleEmployment.be  This test is no t yet approved or cleared by the [place] FDA and  has been authorized for detection and/or diagnosis of SARS-CoV-2 by FDA under an Emergency Use Authorization (EUA). This EUA  will remain  in effect (meaning this test can be used) for the duration of the COVID-19 declaration under Section 564(b)(1) of the Act, 21 U.S.C.section 360bbb-3(b)(1), unless the authorization is terminated  or revoked sooner.       Influenza A by PCR NEGATIVE NEGATIVE Final   Influenza B by PCR NEGATIVE NEGATIVE Final    Comment: (NOTE) The Xpert Xpress SARS-CoV-2/FLU/RSV plus assay is intended as an aid in the diagnosis of influenza from Nasopharyngeal swab specimens and should not be used as a sole basis for treatment. Nasal washings and aspirates are unacceptable for Xpert Xpress SARS-CoV-2/FLU/RSV testing.  Fact Sheet for Patients: EntrepreneurPulse.com.au  Fact Sheet for Healthcare Providers: IncredibleEmployment.be  This test is not yet approved or cleared by the [place] FDA and has been authorized for detection and/or diagnosis of SARS-CoV-2 by FDA under an Emergency Use Authorization (EUA). This EUA will remain in effect (meaning this test can be used) for the duration of the COVID-19 declaration under Section 564(b)(1) of the Act, 21 U.S.C. section 360bbb-3(b)(1), unless the authorization is terminated or revoked.     Resp Syncytial Virus by PCR NEGATIVE NEGATIVE Final    Comment: (NOTE) Fact Sheet for Patients: EntrepreneurPulse.com.au  Fact Sheet for Healthcare Providers: IncredibleEmployment.be  This test is not yet approved or cleared by the [place] FDA and has been  authorized for detection and/or diagnosis of SARS-CoV-2 by FDA under an Emergency Use Authorization (EUA). This EUA will remain in effect (meaning this test can be used) for the duration of the COVID-19 declaration under Section 564(b)(1) of the Act, 21 U.S.C. section 360bbb-3(b)(1), unless the authorization is terminated or revoked.  Performed at [facility], [address] [place], [place] [address]      Labs: BNP (last 3 results) Recent Labs    06/08/22 1049 06/30/22 1227 09/07/22 1212  BNP 909.5* 1,518.4* 6,045.4*   Basic Metabolic Panel: Recent Labs  Lab 09/07/22 1212 09/08/22 0528 09/09/22 0453 09/10/22 0623  NA 134* 135 134* 133*  K 5.1 5.2* 5.2* 5.0  CL 101 102 103 102  CO2 22 22 22 24   GLUCOSE 82 85 142* 113*  BUN 35* 37* 43* 57*  CREATININE 2.70* 2.65* 2.86* 2.72*  CALCIUM 9.2 9.1 9.0 9.0   Liver Function Tests: Recent Labs  Lab 09/07/22 1212  AST 29  ALT 24  ALKPHOS 144*  BILITOT 0.6  PROT 7.4  ALBUMIN 4.0   No results for input(s): "LIPASE", "AMYLASE" in the last 168 hours. No results for input(s): "AMMONIA" in the last 168 hours. CBC: Recent Labs  Lab 09/07/22 1212 09/08/22 0528  WBC 10.8* 8.6  NEUTROABS 8.5*  --   HGB 10.7* 10.0*  HCT 35.1* 32.8*  MCV 87.5 87.5  PLT 366 321   Cardiac Enzymes: No results for input(s): "CKTOTAL", "CKMB", "CKMBINDEX", "TROPONINI" in the last 168 hours. BNP: Invalid input(s): "POCBNP" CBG: No results for input(s): "GLUCAP" in the last 168 hours. D-Dimer No results for input(s): "DDIMER" in the last 72 hours. Hgb A1c No results for input(s): "HGBA1C" in the last 72 hours. Lipid Profile No results for input(s): "CHOL", "HDL", "LDLCALC", "TRIG", "CHOLHDL", "LDLDIRECT" in the last 72 hours. Thyroid function studies No results for input(s): "TSH", "T4TOTAL", "T3FREE", "THYROIDAB" in the last 72 hours.  Invalid input(s): "FREET3" Anemia work up No results for input(s): "VITAMINB12",  "FOLATE", "FERRITIN", "TIBC", "IRON", "RETICCTPCT" in the last 72 hours. Urinalysis    Component Value Date/Time   COLORURINE YELLOW (A) 03/25/2022 2210  APPEARANCEUR CLEAR (A) 03/25/2022 2210   LABSPEC 1.016 03/25/2022 2210   PHURINE 5.0 03/25/2022 2210   GLUCOSEU NEGATIVE 03/25/2022 2210   GLUCOSEU NEGATIVE 01/22/2017 1052   HGBUR NEGATIVE 03/25/2022 2210   BILIRUBINUR NEGATIVE 03/25/2022 2210   KETONESUR NEGATIVE 03/25/2022 2210   PROTEINUR >=300 (A) 03/25/2022 2210   UROBILINOGEN 0.2 01/22/2017 1052   NITRITE NEGATIVE 03/25/2022 2210   LEUKOCYTESUR NEGATIVE 03/25/2022 2210   Sepsis Labs Recent Labs  Lab 09/07/22 1212 09/08/22 0528  WBC 10.8* 8.6   Microbiology Recent Results (from the past 240 hour(s))  Resp panel by RT-PCR (RSV, Flu A&B, Covid) Anterior Nasal Swab     Status: None   Collection Time: 09/07/22 12:24 PM   Specimen: Anterior Nasal Swab  Result Value Ref Range Status   SARS Coronavirus 2 by RT PCR NEGATIVE NEGATIVE Final    Comment: (NOTE) SARS-CoV-2 target nucleic acids are NOT DETECTED.  The SARS-CoV-2 RNA is generally detectable in upper respiratory specimens during the acute phase of infection. The lowest concentration of SARS-CoV-2 viral copies this assay can detect is 138 copies/mL. A negative result does not preclude SARS-Cov-2 infection and should not be used as the sole basis for treatment or other patient management decisions. A negative result may occur with  improper specimen collection/handling, submission of specimen other than nasopharyngeal swab, presence of viral mutation(s) within the areas targeted by this assay, and inadequate number of viral copies(<138 copies/mL). A negative result must be combined with clinical observations, patient history, and epidemiological information. The expected result is Negative.  Fact Sheet for Patients:  EntrepreneurPulse.com.au  Fact Sheet for Healthcare Providers:   IncredibleEmployment.be  This test is no t yet approved or cleared by the [place] FDA and  has been authorized for detection and/or diagnosis of SARS-CoV-2 by FDA under an Emergency Use Authorization (EUA). This EUA will remain  in effect (meaning this test can be used) for the duration of the COVID-19 declaration under Section 564(b)(1) of the Act, 21 U.S.C.section 360bbb-3(b)(1), unless the authorization is terminated  or revoked sooner.       Influenza A by PCR NEGATIVE NEGATIVE Final   Influenza B by PCR NEGATIVE NEGATIVE Final    Comment: (NOTE) The Xpert Xpress SARS-CoV-2/FLU/RSV plus assay is intended as an aid in the diagnosis of influenza from Nasopharyngeal swab specimens and should not be used as a sole basis for treatment. Nasal washings and aspirates are unacceptable for Xpert Xpress SARS-CoV-2/FLU/RSV testing.  Fact Sheet for Patients: EntrepreneurPulse.com.au  Fact Sheet for Healthcare Providers: IncredibleEmployment.be  This test is not yet approved or cleared by the [place] FDA and has been authorized for detection and/or diagnosis of SARS-CoV-2 by FDA under an Emergency Use Authorization (EUA). This EUA will remain in effect (meaning this test can be used) for the duration of the COVID-19 declaration under Section 564(b)(1) of the Act, 21 U.S.C. section 360bbb-3(b)(1), unless the authorization is terminated or revoked.     Resp Syncytial Virus by PCR NEGATIVE NEGATIVE Final    Comment: (NOTE) Fact Sheet for Patients: EntrepreneurPulse.com.au  Fact Sheet for Healthcare Providers: IncredibleEmployment.be  This test is not yet approved or cleared by the [place] FDA and has been authorized for detection and/or diagnosis of SARS-CoV-2 by FDA under an Emergency Use Authorization (EUA). This EUA will remain in effect (meaning this test can be used) for  the duration of the COVID-19 declaration under Section 564(b)(1) of the Act, 21 U.S.C. section 360bbb-3(b)(1), unless the authorization is  terminated or revoked.  Performed at [facility], [address], [place], [place] [address]      Time coordinating discharge: Over 30 minutes  SIGNED:   Shawna Clamp, MD  [facility] 09/12/2022, 5:12 PM Pager   If 7PM-7AM, please contact night-coverage

## 2022-09-12 NOTE — Plan of Care (Signed)

## 2022-09-12 NOTE — Telephone Encounter (Signed)
Yes. Confirm doing ok.  Needs TCM phone call.  Hold for work in appt thanks.

## 2022-09-12 NOTE — Telephone Encounter (Signed)
[facility] nurse called in staying that pt was admitted and its been discharged today. She was calling to a Hospital F/U for pt, as per nurse she need to be seen within the next week or so. However theres no slot open for me to put pt in. I did advised the nurse that I will send a message to her assistant so they can take of this.

## 2022-09-12 NOTE — TOC Transition Note (Signed)
Transition of Care [facility]) - CM/SW Discharge Note   Patient Details  Name: Dawn Sherman MRN: [id number] Date of Birth: 1955-11-10  [facility] of [facility] CM/SW Contact:  Candie Chroman, LCSW Phone Number: 09/12/2022, 11:05 AM   Clinical Narrative:  Patient has orders to discharge home today. Ordered home oxygen through [employer] to be delivered to the room before she leaves. No further concerns. CSW signing off.   Final next level of care: Home/Self Care Barriers to Discharge: Barriers Resolved   Patient Goals and CMS Choice CMS Medicare.gov Compare Post Acute Care list provided to:: Patient Choice offered to / list presented to : Patient  Discharge Placement                  Patient to be transferred to facility by: Husband   Patient and family notified of of transfer: 09/12/22  Discharge Plan and Services Additional resources added to the After Visit Summary for                  DME Arranged: Oxygen   Date DME Agency Contacted: 09/12/22   Representative spoke with at DME Agency: [facility] Determinants of Health ([place]) Interventions SDOH Screenings   Food Insecurity: No Food Insecurity (09/07/2022)  Housing: Low Risk  (09/07/2022)  Transportation Needs: No Transportation Needs (09/07/2022)  Utilities: Not At Risk (09/07/2022)  Depression (PHQ2-9): Low Risk  (08/14/2022)  Financial Resource Strain: Low Risk  (11/27/2021)  Physical Activity: Sufficiently Active (11/27/2021)  Social Connections: Unknown (11/27/2021)  Stress: No Stress Concern Present (11/27/2021)  Tobacco Use: High Risk (09/07/2022)     Readmission Risk Interventions    07/02/2022   12:26 PM 12/11/2021    3:17 PM  Readmission Risk Prevention Plan  Transportation Screening Complete Complete  PCP or Specialist Appt within 3-5 Days  Complete  HRI or [place]  Complete  Social Work Consult for [employer] Planning/Counseling  Complete  Palliative Care Screening  Not  Applicable  Medication Review Press photographer) Complete Complete  PCP or Specialist appointment within 3-5 days of discharge Complete   SW Recovery Care/Counseling Consult Complete   [place] Not Applicable

## 2022-09-13 ENCOUNTER — Telehealth: Payer: Self-pay

## 2022-09-13 NOTE — Patient Outreach (Signed)
  Care Coordination TOC Note Transition Care Management Unsuccessful Follow-up Telephone Call  Date of discharge and from where:  [facility] 09/12/22  Attempts:  1st Attempt  Reason for unsuccessful TCM follow-up call:  Unable to leave message-unidentified voicemail.  Johnney Killian, RN, BSN, CCM Care Management Coordinator /[facility] Phone: [phone]: [phone]

## 2022-09-16 ENCOUNTER — Telehealth: Payer: Self-pay

## 2022-09-16 ENCOUNTER — Other Ambulatory Visit: Payer: Medicare Other

## 2022-09-16 NOTE — Patient Outreach (Signed)
  Care Coordination [facility] Note Transition Care Management Unsuccessful Follow-up Telephone Call  Date of discharge and from where:  [facility] 09/12/21  Attempts:  2nd Attempt  Reason for unsuccessful TCM follow-up call:  No answer/busy- Voicemail not identified  Johnney Killian, RN, BSN, CCM Care Management Coordinator [facility] Phone: [phone]: [phone]

## 2022-09-16 NOTE — Patient Outreach (Signed)
Duplicate entry; disregard

## 2022-09-17 ENCOUNTER — Telehealth: Payer: Self-pay

## 2022-09-17 ENCOUNTER — Encounter: Payer: Medicare Other | Admitting: Family

## 2022-09-17 NOTE — Telephone Encounter (Signed)
See other note for TCM call. 

## 2022-09-17 NOTE — Telephone Encounter (Signed)
Notification that pt declined evaluation.

## 2022-09-17 NOTE — Patient Outreach (Signed)
  Care Coordination TOC Note Transition Care Management Follow-up Telephone Call Date of discharge and from where: [facility] 09/12/22 How have you been since you were released from the hospital? "I am feeling pretty good, showing daily improvement". Any questions or concerns? No  Items Reviewed: Did the pt receive and understand the discharge instructions provided? Yes  Medications obtained and verified? Yes  Other? No  Any new allergies since your discharge? No  Dietary orders reviewed? Yes Do you have support at home? Yes   Home Care and Equipment/Supplies: Were home health services ordered? no If so, what is the name of the agency? N/a  Has the agency set up a time to come to the patient's home? no Were any new equipment or medical supplies ordered?  No What is the name of the medical supply agency? N/a Were you able to get the supplies/equipment? no Do you have any questions related to the use of the equipment or supplies? No  Functional Questionnaire: (I = Independent and D = Dependent) ADLs: I  Bathing/Dressing- I  Meal Prep- I  Eating- I  Maintaining continence- I  Transferring/Ambulation- I  Managing Meds- I  Follow up appointments reviewed:  PCP Hospital f/u appt confirmed? Yes  Scheduled to see Dr. [name] on 10/03/22 @ [facility] f/u appt confirmed? Yes  Scheduled to see Dr. Rockey Situ on 10/08/22 @ 0940. Are transportation arrangements needed? No  If their condition worsens, is the pt aware to call PCP or go to the Emergency Dept.? Yes Was the patient provided with contact information for the PCP's office or ED? Yes Was to pt encouraged to call back with questions or concerns? Yes  SDOH assessments and interventions completed:   Yes SDOH Interventions Today    Flowsheet Row Most Recent Value  SDOH Interventions   Food Insecurity Interventions Intervention Not Indicated  Housing Interventions Intervention Not Indicated  Transportation Interventions  Intervention Not Indicated       Care Coordination Interventions:  No Care Coordination interventions needed at this time.   Encounter Outcome:  Pt. Visit Completed

## 2022-09-18 ENCOUNTER — Other Ambulatory Visit: Payer: Self-pay

## 2022-09-18 ENCOUNTER — Telehealth: Payer: Self-pay | Admitting: Internal Medicine

## 2022-09-18 MED ORDER — ALBUTEROL SULFATE HFA 108 (90 BASE) MCG/ACT IN AERS: 2.0000 | INHALATION_SPRAY | Freq: Four times a day (QID) | RESPIRATORY_TRACT | 2 refills | Status: DC | PRN

## 2022-09-18 NOTE — Telephone Encounter (Signed)
Patient has HFU with you on 2/8. This was previously prescribed by someone else. Are you ok with filling her albuterol inhaler?

## 2022-09-18 NOTE — Telephone Encounter (Signed)
Ok to refill.  Please confirm she is doing ok - breathing ok.

## 2022-09-18 NOTE — Telephone Encounter (Signed)
[facility], in [place] is requesting a refill on albuterol (VENTOLIN HFA) 108 (90 Base) MCG/ACT inhaler, for patient.

## 2022-09-18 NOTE — Telephone Encounter (Signed)
Pt called back. Read note below to her, she's aware. She also said she's dong ok.

## 2022-09-18 NOTE — Telephone Encounter (Signed)
Noted  

## 2022-09-18 NOTE — Telephone Encounter (Signed)
Inhaler sent in. LM for patient.

## 2022-09-23 ENCOUNTER — Emergency Department: Payer: Medicare Other

## 2022-09-23 ENCOUNTER — Other Ambulatory Visit: Payer: Self-pay

## 2022-09-23 ENCOUNTER — Inpatient Hospital Stay
Admission: EM | Admit: 2022-09-23 | Discharge: 2022-10-01 | DRG: 291 | Disposition: A | Discharge status: Home Health | Payer: Medicare Other | Attending: Student in an Organized Health Care Education/Training Program | Admitting: Student in an Organized Health Care Education/Training Program

## 2022-09-23 DIAGNOSIS — N184 Chronic kidney disease, stage 4 (severe): Secondary | ICD-10-CM | POA: Diagnosis present

## 2022-09-23 DIAGNOSIS — Z7984 Long term (current) use of oral hypoglycemic drugs: Secondary | ICD-10-CM

## 2022-09-23 DIAGNOSIS — I509 Heart failure, unspecified: Secondary | ICD-10-CM | POA: Diagnosis not present

## 2022-09-23 DIAGNOSIS — I130 Hypertensive heart and chronic kidney disease with heart failure and stage 1 through stage 4 chronic kidney disease, or unspecified chronic kidney disease: Secondary | ICD-10-CM | POA: Diagnosis present

## 2022-09-23 DIAGNOSIS — F1011 Alcohol abuse, in remission: Secondary | ICD-10-CM | POA: Diagnosis present

## 2022-09-23 DIAGNOSIS — Z79890 Hormone replacement therapy (postmenopausal): Secondary | ICD-10-CM

## 2022-09-23 DIAGNOSIS — I2489 Other forms of acute ischemic heart disease: Secondary | ICD-10-CM | POA: Diagnosis present

## 2022-09-23 DIAGNOSIS — J9811 Atelectasis: Secondary | ICD-10-CM | POA: Diagnosis present

## 2022-09-23 DIAGNOSIS — J9621 Acute and chronic respiratory failure with hypoxia: Secondary | ICD-10-CM | POA: Diagnosis present

## 2022-09-23 DIAGNOSIS — R079 Chest pain, unspecified: Secondary | ICD-10-CM | POA: Diagnosis present

## 2022-09-23 DIAGNOSIS — Z882 Allergy status to sulfonamides status: Secondary | ICD-10-CM

## 2022-09-23 DIAGNOSIS — J439 Emphysema, unspecified: Secondary | ICD-10-CM | POA: Diagnosis not present

## 2022-09-23 DIAGNOSIS — F418 Other specified anxiety disorders: Secondary | ICD-10-CM | POA: Diagnosis present

## 2022-09-23 DIAGNOSIS — Z79899 Other long term (current) drug therapy: Secondary | ICD-10-CM

## 2022-09-23 DIAGNOSIS — Z90710 Acquired absence of both cervix and uterus: Secondary | ICD-10-CM

## 2022-09-23 DIAGNOSIS — Z888 Allergy status to other drugs, medicaments and biological substances status: Secondary | ICD-10-CM

## 2022-09-23 DIAGNOSIS — R0602 Shortness of breath: Secondary | ICD-10-CM | POA: Diagnosis present

## 2022-09-23 DIAGNOSIS — E782 Mixed hyperlipidemia: Secondary | ICD-10-CM | POA: Diagnosis present

## 2022-09-23 DIAGNOSIS — Z83438 Family history of other disorder of lipoprotein metabolism and other lipidemia: Secondary | ICD-10-CM

## 2022-09-23 DIAGNOSIS — I5033 Acute on chronic diastolic (congestive) heart failure: Secondary | ICD-10-CM | POA: Diagnosis present

## 2022-09-23 DIAGNOSIS — J440 Chronic obstructive pulmonary disease with acute lower respiratory infection: Secondary | ICD-10-CM | POA: Diagnosis present

## 2022-09-23 DIAGNOSIS — J449 Chronic obstructive pulmonary disease, unspecified: Secondary | ICD-10-CM | POA: Diagnosis present

## 2022-09-23 DIAGNOSIS — F419 Anxiety disorder, unspecified: Secondary | ICD-10-CM | POA: Diagnosis present

## 2022-09-23 DIAGNOSIS — I5A Non-ischemic myocardial injury (non-traumatic): Secondary | ICD-10-CM | POA: Diagnosis present

## 2022-09-23 DIAGNOSIS — K219 Gastro-esophageal reflux disease without esophagitis: Secondary | ICD-10-CM | POA: Diagnosis present

## 2022-09-23 DIAGNOSIS — Z880 Allergy status to penicillin: Secondary | ICD-10-CM

## 2022-09-23 DIAGNOSIS — I10 Essential (primary) hypertension: Secondary | ICD-10-CM | POA: Diagnosis present

## 2022-09-23 DIAGNOSIS — F32A Depression, unspecified: Secondary | ICD-10-CM | POA: Diagnosis present

## 2022-09-23 DIAGNOSIS — Z7901 Long term (current) use of anticoagulants: Secondary | ICD-10-CM

## 2022-09-23 DIAGNOSIS — I5031 Acute diastolic (congestive) heart failure: Secondary | ICD-10-CM | POA: Diagnosis not present

## 2022-09-23 DIAGNOSIS — Z87891 Personal history of nicotine dependence: Secondary | ICD-10-CM | POA: Diagnosis not present

## 2022-09-23 DIAGNOSIS — I480 Paroxysmal atrial fibrillation: Secondary | ICD-10-CM | POA: Diagnosis present

## 2022-09-23 DIAGNOSIS — Z881 Allergy status to other antibiotic agents status: Secondary | ICD-10-CM

## 2022-09-23 DIAGNOSIS — Z8261 Family history of arthritis: Secondary | ICD-10-CM

## 2022-09-23 DIAGNOSIS — Z1152 Encounter for screening for COVID-19: Secondary | ICD-10-CM | POA: Diagnosis not present

## 2022-09-23 DIAGNOSIS — Z7951 Long term (current) use of inhaled steroids: Secondary | ICD-10-CM | POA: Diagnosis not present

## 2022-09-23 DIAGNOSIS — J189 Pneumonia, unspecified organism: Secondary | ICD-10-CM

## 2022-09-23 DIAGNOSIS — J441 Chronic obstructive pulmonary disease with (acute) exacerbation: Secondary | ICD-10-CM | POA: Diagnosis present

## 2022-09-23 DIAGNOSIS — E039 Hypothyroidism, unspecified: Secondary | ICD-10-CM | POA: Diagnosis present

## 2022-09-23 DIAGNOSIS — E785 Hyperlipidemia, unspecified: Secondary | ICD-10-CM | POA: Diagnosis not present

## 2022-09-23 DIAGNOSIS — Z885 Allergy status to narcotic agent status: Secondary | ICD-10-CM

## 2022-09-23 DIAGNOSIS — Z720 Tobacco use: Secondary | ICD-10-CM | POA: Diagnosis present

## 2022-09-23 DIAGNOSIS — Z9981 Dependence on supplemental oxygen: Secondary | ICD-10-CM

## 2022-09-23 LAB — CBC WITH DIFFERENTIAL/PLATELET
Abs Immature Granulocytes: 0.05 10*3/uL (ref 0.00–0.07)
Basophils Absolute: 0.1 10*3/uL (ref 0.0–0.1)
Basophils Relative: 0 %
Eosinophils Absolute: 0.1 10*3/uL (ref 0.0–0.5)
Eosinophils Relative: 1 %
HCT: 35.9 % — ABNORMAL LOW (ref 36.0–46.0)
Hemoglobin: 10.6 g/dL — ABNORMAL LOW (ref 12.0–15.0)
Immature Granulocytes: 0 %
Lymphocytes Relative: 7 %
Lymphs Abs: 1 10*3/uL (ref 0.7–4.0)
MCH: 25.7 pg — ABNORMAL LOW (ref 26.0–34.0)
MCHC: 29.5 g/dL — ABNORMAL LOW (ref 30.0–36.0)
MCV: 87.1 fL (ref 80.0–100.0)
Monocytes Absolute: 1.4 10*3/uL — ABNORMAL HIGH (ref 0.1–1.0)
Monocytes Relative: 10 %
Neutro Abs: 12.1 10*3/uL — ABNORMAL HIGH (ref 1.7–7.7)
Neutrophils Relative %: 82 %
Platelets: 241 10*3/uL (ref 150–400)
RBC: 4.12 MIL/uL (ref 3.87–5.11)
RDW: 17.1 % — ABNORMAL HIGH (ref 11.5–15.5)
WBC: 14.7 10*3/uL — ABNORMAL HIGH (ref 4.0–10.5)
nRBC: 0 % (ref 0.0–0.2)

## 2022-09-23 LAB — BASIC METABOLIC PANEL
Anion gap: 12 (ref 5–15)
BUN: 33 mg/dL — ABNORMAL HIGH (ref 8–23)
CO2: 22 mmol/L (ref 22–32)
Calcium: 8.9 mg/dL (ref 8.9–10.3)
Chloride: 106 mmol/L (ref 98–111)
Creatinine, Ser: 2.29 mg/dL — ABNORMAL HIGH (ref 0.44–1.00)
GFR, Estimated: 23 mL/min — ABNORMAL LOW (ref >60)
Glucose, Bld: 118 mg/dL — ABNORMAL HIGH (ref 70–99)
Potassium: 4.8 mmol/L (ref 3.5–5.1)
Sodium: 140 mmol/L (ref 135–145)

## 2022-09-23 LAB — HEMOGLOBIN A1C
Hgb A1c MFr Bld: 5.6 % (ref 4.8–5.6)
Mean Plasma Glucose: 114.02 mg/dL

## 2022-09-23 LAB — BRAIN NATRIURETIC PEPTIDE: B Natriuretic Peptide: 3872 pg/mL — ABNORMAL HIGH (ref 0.0–100.0)

## 2022-09-23 LAB — RESP PANEL BY RT-PCR (RSV, FLU A&B, COVID)  RVPGX2
Influenza A by PCR: NEGATIVE
Influenza B by PCR: NEGATIVE
Resp Syncytial Virus by PCR: NEGATIVE
SARS Coronavirus 2 by RT PCR: NEGATIVE

## 2022-09-23 LAB — PROTIME-INR
INR: 1.1 (ref 0.8–1.2)
Prothrombin Time: 14.1 seconds (ref 11.4–15.2)

## 2022-09-23 LAB — STREP PNEUMONIAE URINARY ANTIGEN: Strep Pneumo Urinary Antigen: NEGATIVE

## 2022-09-23 LAB — TROPONIN I (HIGH SENSITIVITY)
Troponin I (High Sensitivity): 19 ng/L — ABNORMAL HIGH (ref <18)
Troponin I (High Sensitivity): 17 ng/L (ref <18)
Troponin I (High Sensitivity): 17 ng/L (ref <18)

## 2022-09-23 LAB — PROCALCITONIN: Procalcitonin: <0.1 ng/mL

## 2022-09-23 LAB — LACTIC ACID, PLASMA
Lactic Acid, Venous: 1 mmol/L (ref 0.5–1.9)
Lactic Acid, Venous: 1.2 mmol/L (ref 0.5–1.9)

## 2022-09-23 MED ORDER — NICOTINE 21 MG/24HR TD PT24
21.0000 mg | MEDICATED_PATCH | Freq: Every day | TRANSDERMAL | Status: DC
  Filled 2022-09-23 (×8): qty 1

## 2022-09-23 MED ORDER — IPRATROPIUM-ALBUTEROL 0.5-2.5 (3) MG/3ML IN SOLN
3.0000 mL | Freq: Once | RESPIRATORY_TRACT | Status: AC
  Administered 2022-09-23: 3 mL via RESPIRATORY_TRACT
  Filled 2022-09-23: qty 3

## 2022-09-23 MED ORDER — DM-GUAIFENESIN ER 30-600 MG PO TB12
1.0000 | ORAL_TABLET | Freq: Two times a day (BID) | ORAL | Status: DC | PRN
  Administered 2022-09-29 – 2022-09-30 (×2): 1 via ORAL
  Filled 2022-09-23 (×3): qty 1

## 2022-09-23 MED ORDER — ACETAMINOPHEN 325 MG PO TABS
650.0000 mg | ORAL_TABLET | Freq: Four times a day (QID) | ORAL | Status: DC | PRN
  Administered 2022-09-27 – 2022-10-01 (×2): 650 mg via ORAL
  Filled 2022-09-23 (×2): qty 2

## 2022-09-23 MED ORDER — METHYLPREDNISOLONE SODIUM SUCC 40 MG IJ SOLR
40.0000 mg | Freq: Two times a day (BID) | INTRAMUSCULAR | Status: DC
  Administered 2022-09-23 – 2022-09-27 (×7): 40 mg via INTRAVENOUS
  Filled 2022-09-23 (×8): qty 1

## 2022-09-23 MED ORDER — PANTOPRAZOLE SODIUM 40 MG PO TBEC
40.0000 mg | DELAYED_RELEASE_TABLET | Freq: Every day | ORAL | Status: DC
  Administered 2022-09-23 – 2022-10-01 (×9): 40 mg via ORAL
  Filled 2022-09-23 (×10): qty 1

## 2022-09-23 MED ORDER — ASPIRIN 81 MG PO TBEC
81.0000 mg | DELAYED_RELEASE_TABLET | Freq: Every day | ORAL | Status: DC
  Administered 2022-09-24 – 2022-10-01 (×8): 81 mg via ORAL
  Filled 2022-09-23 (×9): qty 1

## 2022-09-23 MED ORDER — LORATADINE 10 MG PO TABS
10.0000 mg | ORAL_TABLET | Freq: Every day | ORAL | Status: DC
  Administered 2022-09-23 – 2022-10-01 (×9): 10 mg via ORAL
  Filled 2022-09-23 (×9): qty 1

## 2022-09-23 MED ORDER — SODIUM CHLORIDE 0.9 % IV SOLN: 100.0000 mg | Freq: Two times a day (BID) | INTRAVENOUS | Status: DC

## 2022-09-23 MED ORDER — IPRATROPIUM-ALBUTEROL 0.5-2.5 (3) MG/3ML IN SOLN
3.0000 mL | RESPIRATORY_TRACT | Status: DC
  Administered 2022-09-23 (×4): 3 mL via RESPIRATORY_TRACT
  Filled 2022-09-23 (×4): qty 3

## 2022-09-23 MED ORDER — SODIUM CHLORIDE 0.9 % IV SOLN
2.0000 g | Freq: Once | INTRAVENOUS | Status: AC
  Administered 2022-09-23: 2 g via INTRAVENOUS
  Filled 2022-09-23: qty 12.5

## 2022-09-23 MED ORDER — VENLAFAXINE HCL ER 75 MG PO CP24
150.0000 mg | ORAL_CAPSULE | Freq: Every day | ORAL | Status: DC
  Administered 2022-09-23 – 2022-10-01 (×9): 150 mg via ORAL
  Filled 2022-09-23 (×4): qty 2
  Filled 2022-09-23: qty 1
  Filled 2022-09-23 (×4): qty 2

## 2022-09-23 MED ORDER — APIXABAN 2.5 MG PO TABS
2.5000 mg | ORAL_TABLET | Freq: Two times a day (BID) | ORAL | Status: DC
  Administered 2022-09-23 – 2022-10-01 (×17): 2.5 mg via ORAL
  Filled 2022-09-23 (×17): qty 1

## 2022-09-23 MED ORDER — BUPROPION HCL ER (XL) 150 MG PO TB24
150.0000 mg | ORAL_TABLET | Freq: Every day | ORAL | Status: DC
  Administered 2022-09-23 – 2022-10-01 (×9): 150 mg via ORAL
  Filled 2022-09-23 (×11): qty 1

## 2022-09-23 MED ORDER — FUROSEMIDE 10 MG/ML IJ SOLN
60.0000 mg | Freq: Once | INTRAMUSCULAR | Status: AC
  Administered 2022-09-23: 60 mg via INTRAVENOUS
  Filled 2022-09-23: qty 8

## 2022-09-23 MED ORDER — HYDRALAZINE HCL 50 MG PO TABS
25.0000 mg | ORAL_TABLET | Freq: Three times a day (TID) | ORAL | Status: DC
  Administered 2022-09-23: 25 mg via ORAL
  Filled 2022-09-23 (×2): qty 1

## 2022-09-23 MED ORDER — ATORVASTATIN CALCIUM 20 MG PO TABS
40.0000 mg | ORAL_TABLET | Freq: Every evening | ORAL | Status: DC
  Administered 2022-09-23 – 2022-09-30 (×8): 40 mg via ORAL
  Filled 2022-09-23 (×8): qty 2

## 2022-09-23 MED ORDER — NITROGLYCERIN 0.4 MG SL SUBL: 0.4000 mg | SUBLINGUAL_TABLET | SUBLINGUAL | Status: DC | PRN

## 2022-09-23 MED ORDER — CALCIUM CARBONATE 1250 (500 CA) MG PO TABS
1.0000 | ORAL_TABLET | Freq: Three times a day (TID) | ORAL | Status: DC
  Administered 2022-09-23 – 2022-10-01 (×23): 1250 mg via ORAL
  Filled 2022-09-23 (×28): qty 1

## 2022-09-23 MED ORDER — AMIODARONE HCL 200 MG PO TABS
200.0000 mg | ORAL_TABLET | Freq: Every day | ORAL | Status: DC
  Administered 2022-09-23 – 2022-10-01 (×9): 200 mg via ORAL
  Filled 2022-09-23 (×9): qty 1

## 2022-09-23 MED ORDER — IPRATROPIUM-ALBUTEROL 0.5-2.5 (3) MG/3ML IN SOLN
3.0000 mL | Freq: Three times a day (TID) | RESPIRATORY_TRACT | Status: DC
  Administered 2022-09-24 – 2022-09-30 (×19): 3 mL via RESPIRATORY_TRACT
  Filled 2022-09-23 (×19): qty 3

## 2022-09-23 MED ORDER — HYDRALAZINE HCL 20 MG/ML IJ SOLN
5.0000 mg | INTRAMUSCULAR | Status: DC | PRN
  Administered 2022-09-27: 5 mg via INTRAVENOUS
  Filled 2022-09-23: qty 1

## 2022-09-23 MED ORDER — METHYLPREDNISOLONE SODIUM SUCC 125 MG IJ SOLR
125.0000 mg | Freq: Once | INTRAMUSCULAR | Status: AC
  Administered 2022-09-23: 125 mg via INTRAVENOUS
  Filled 2022-09-23: qty 2

## 2022-09-23 MED ORDER — FUROSEMIDE 10 MG/ML IJ SOLN
40.0000 mg | Freq: Two times a day (BID) | INTRAMUSCULAR | Status: DC
  Administered 2022-09-23 – 2022-09-25 (×4): 40 mg via INTRAVENOUS
  Filled 2022-09-23 (×4): qty 4

## 2022-09-23 MED ORDER — DILTIAZEM HCL ER COATED BEADS 120 MG PO CP24
120.0000 mg | ORAL_CAPSULE | Freq: Every day | ORAL | Status: DC
  Administered 2022-09-23 – 2022-10-01 (×9): 120 mg via ORAL
  Filled 2022-09-23 (×9): qty 1

## 2022-09-23 MED ORDER — DAPAGLIFLOZIN PROPANEDIOL 10 MG PO TABS
10.0000 mg | ORAL_TABLET | Freq: Every day | ORAL | Status: DC
  Administered 2022-09-23 – 2022-10-01 (×9): 10 mg via ORAL
  Filled 2022-09-23 (×2): qty 2
  Filled 2022-09-23 (×3): qty 1
  Filled 2022-09-23: qty 2
  Filled 2022-09-23 (×2): qty 1
  Filled 2022-09-23: qty 2
  Filled 2022-09-23 (×2): qty 1

## 2022-09-23 MED ORDER — ONDANSETRON HCL 4 MG/2ML IJ SOLN: 4.0000 mg | Freq: Three times a day (TID) | INTRAMUSCULAR | Status: DC | PRN

## 2022-09-23 MED ORDER — VANCOMYCIN HCL IN DEXTROSE 1-5 GM/200ML-% IV SOLN
1000.0000 mg | Freq: Once | INTRAVENOUS | Status: AC
  Administered 2022-09-23: 1000 mg via INTRAVENOUS
  Filled 2022-09-23: qty 200

## 2022-09-23 MED ORDER — ALBUTEROL SULFATE (2.5 MG/3ML) 0.083% IN NEBU: 2.5000 mg | INHALATION_SOLUTION | RESPIRATORY_TRACT | Status: DC | PRN

## 2022-09-23 MED ORDER — DOXYCYCLINE HYCLATE 100 MG PO TABS
100.0000 mg | ORAL_TABLET | Freq: Two times a day (BID) | ORAL | Status: AC
  Administered 2022-09-23 – 2022-09-28 (×10): 100 mg via ORAL
  Filled 2022-09-23 (×10): qty 1

## 2022-09-23 NOTE — H&P (Addendum)
History and Physical    [name]:[id number] DOB: [birth year] DOA: 09/23/2022  Referring MD/NP/PA:   PCP: [name], MD   Patient coming from:  The patient is coming from home.  At baseline, pt is independent for most of ADL.        Chief Complaint: SOB  HPI: Dawn Sherman is a 67 y.o. female with medical history significant of COPD on prn 3L of oxygen at night, hypertension, hyperlipidemia, GERD, depression, tobacco abuse, alcohol abuse in remission, CKD-IV,  dCHF (used to have sCHF with EF 30-35%, improved to EF 50-55 by 2D Echo on 07/01/22), SDH, PAF on Eliquis, second-degree AV block, presents with SOB.   Patient was recently hospitalized from 1/13 - 1/18 due to COPD exacerbation.  Patient was discharged on prednisone.  She states that her shortness of breath has been progressively worsening since last night.  She coughs up little yellow-colored sputum.  Denies fever or chills.  She had chest pain earlier, which is located in the substernal area, dull, 6 out of 10 in severity, radiating to the left arm, currently chest pain is resolved.  Patient does not have palpitation.  Denies tenderness in the calf areas.  No nausea, vomiting, diarrhea or abdominal pain.  No symptoms of UTI.  After recent discharge, patient has been using prn 3 L oxygen only at night.  Patient is found to have moderate acute respiratory distress, using accessory muscle for breathing, hardly speaking in full sentence.  Started on 3 L oxygen with 98-100% saturation.   Data reviewed independently and ED Course: pt was found to have WBC 14.7, trop 19, negative PCR for COVID, flu and RSV, stable renal function, temperature normal, blood pressure 145/78, heart rate 99, RR 25, 18.  Chest x-ray showed cardiomegaly, small to moderate right pleural effusion, interstitial edema.  Patient is admitted to PCU as inpatient   EKG: I have personally reviewed.  As artificial effects, sinus rhythm, QTc 472, poor R wave  progression, anteroseptal infarction pattern, T wave inversion in V5-V6   Review of Systems:   General: no fevers, chills, no body weight gain, has fatigue HEENT: no blurry vision, hearing changes or sore throat Respiratory: has dyspnea, coughing, no wheezing CV: had chest pain, no palpitations GI: no nausea, vomiting, abdominal pain, diarrhea, constipation GU: no dysuria, burning on urination, increased urinary frequency, hematuria  Ext: has leg edema Neuro: no unilateral weakness, numbness, or tingling, no vision change or hearing loss Skin: no rash, no skin tear. MSK: No muscle spasm, no deformity, no limitation of range of movement in spin Heme: No easy bruising.  Travel history: No recent long distant travel.   Allergy:  Allergies  Allergen Reactions   Sulfate Rash   Codeine Sulfate Nausea Only   Benicar [Olmesartan]     Talked with patient February 10, 2020, intolerance is unclear, tried several medications around that time and one of them gave her a rash but she is not clear which 1.   Amoxicillin Rash   Clindamycin/Lincomycin Rash   Entresto [Sacubitril-Valsartan] Other (See Comments)    hyperkalemia   Morphine And Related Rash   Penicillins Rash    Past Medical History:  Diagnosis Date   B12 deficiency    CHF (congestive heart failure) (HCC)    CKD (chronic kidney disease), stage III (HCC)    COPD (chronic obstructive pulmonary disease) (HCC)    Depression    Endometriosis    Hypertension    Mixed hyperlipidemia  Tobacco abuse     Past Surgical History:  Procedure Laterality Date   ABDOMINAL HYSTERECTOMY     BREAST BIOPSY Right 2015   neg-  FIBROADENOMA   TAH/RSO  1999   secondary to bleeding and endometriosis (Dr Vernie Ammons)    Social History:  reports that she has quit smoking. Her smoking use included cigarettes. She has a 10.00 pack-year smoking history. She has never used smokeless tobacco. She reports that she does not currently use alcohol. She  reports that she does not use drugs.  Family History:  Family History  Problem Relation Age of Onset   Hypercholesterolemia Mother    Rheum arthritis Father    Rheum arthritis Daughter    Fibromyalgia Daughter    Breast cancer Neg Hx    Colon cancer Neg Hx      Prior to Admission medications   Medication Sig Start Date End Date Taking? Authorizing Provider  albuterol (VENTOLIN HFA) 108 (90 Base) MCG/ACT inhaler Inhale 2 puffs into the lungs every 6 (six) hours as needed for wheezing or shortness of breath. 09/18/22   [name], MD  amiodarone (PACERONE) 200 MG tablet Take 1 tablet (200 mg total) by mouth daily. 09/12/22   Shawna Clamp, MD  apixaban (ELIQUIS) 2.5 MG TABS tablet Take 1 tablet (2.5 mg total) by mouth 2 (two) times daily. 04/08/22   Minna Merritts, MD  atorvastatin (LIPITOR) 40 MG tablet TAKE ONE TABLET BY MOUTH EVERY DAY 09/03/22   [name], MD  buPROPion (WELLBUTRIN XL) 150 MG 24 hr tablet TAKE ONE TABLET BY MOUTH EVERY DAY 07/15/22   [name], MD  calcium carbonate (OS-CAL - DOSED IN MG OF ELEMENTAL CALCIUM) 1250 (500 Ca) MG tablet Take 1 tablet (500 mg of elemental calcium total) by mouth 3 (three) times daily with meals. 12/14/21   Fritzi Mandes, MD  diltiazem (CARDIZEM CD) 120 MG 24 hr capsule Take 1 capsule (120 mg total) by mouth daily. 07/06/22   Dwyane Dee, MD  FARXIGA 10 MG TABS tablet Take 10 mg by mouth daily. 11/20/21   [provider]  ferrous sulfate 325 (65 FE) MG tablet Take 1 tablet (325 mg total) by mouth 2 (two) times daily with a meal. 12/14/21   Fritzi Mandes, MD  Fluticasone-Umeclidin-Vilant (TRELEGY ELLIPTA) 100-62.5-25 MCG/ACT AEPB Inhale 1 puff into the lungs daily. 03/05/22   [name], MD  furosemide (LASIX) 40 MG tablet Take 1 tablet (40 mg total) by mouth daily. Take 40 mg daily and and extra 40 mg as needed for fluid retention 04/24/22   Darylene Price A, FNP  hydrALAZINE (APRESOLINE) 25 MG tablet Take 1 tablet (25 mg  total) by mouth 3 (three) times daily. 01/31/22   Alisa Graff, FNP  levothyroxine (SYNTHROID) 50 MCG tablet Take 1 tablet (50 mcg total) by mouth daily. 07/29/22   [name], MD  loratadine (CLARITIN) 10 MG tablet Take 10 mg by mouth daily.    [provider]  omeprazole (PRILOSEC) 20 MG capsule Take 20 mg by mouth daily.    [provider]  venlafaxine XR (EFFEXOR-XR) 150 MG 24 hr capsule TAKE ONE CAPSULE BY MOUTH EVERY DAY 06/24/22   [name], MD    Physical Exam: Vitals:   09/23/22 0529 09/23/22 0531 09/23/22 0533 09/23/22 0600  BP:  (!) 154/76  (!) 145/78  Pulse:  99  93  Resp:  (!) 25  18  Temp:  97.6 F (36.4 C)    TempSrc:  Oral    SpO2: 98% 100%  95%  Weight:   56.7 kg   Height:   5\' 5"  (1.651 m)    General: In moderate acute respiratory distress HEENT:       Eyes: PERRL, EOMI, no scleral icterus.       ENT: No discharge from the ears and nose, no pharynx injection, no tonsillar enlargement.        Neck: positive JVD, no bruit, no mass felt. Heme: No neck lymph node enlargement. Cardiac: S1/S2, RRR, No murmurs, No gallops or rubs. Respiratory: Has severely decreased air movement on the right side, has fine crackles on the left side, GI: Soft, nondistended, nontender, no rebound pain, no organomegaly, BS present. GU: No hematuria Ext: 1+ pitting leg edema bilaterally. 1+DP/PT pulse bilaterally. Musculoskeletal: No joint deformities, No joint redness or warmth, no limitation of ROM in spin. Skin: No rashes.  Neuro: Alert, oriented X3, cranial nerves II-XII grossly intact, moves all extremities normally.  Psych: Patient is not psychotic, no suicidal or hemocidal ideation.  Labs on Admission: I have personally reviewed following labs and imaging studies  CBC: Recent Labs  Lab 09/23/22 0556  WBC 14.7*  NEUTROABS 12.1*  HGB 10.6*  HCT 35.9*  MCV 87.1  PLT 536   Basic Metabolic Panel: Recent Labs  Lab 09/23/22 0556  NA 140  K 4.8   CL 106  CO2 22  GLUCOSE 118*  BUN 33*  CREATININE 2.29*  CALCIUM 8.9   GFR: Estimated Creatinine Clearance: 21.3 mL/min (A) (by C-G formula based on SCr of 2.29 mg/dL (H)). Liver Function Tests: No results for input(s): "AST", "ALT", "ALKPHOS", "BILITOT", "PROT", "ALBUMIN" in the last 168 hours. No results for input(s): "LIPASE", "AMYLASE" in the last 168 hours. No results for input(s): "AMMONIA" in the last 168 hours. Coagulation Profile: Recent Labs  Lab 09/23/22 0757  INR 1.1   Cardiac Enzymes: No results for input(s): "CKTOTAL", "CKMB", "CKMBINDEX", "TROPONINI" in the last 168 hours. BNP (last 3 results) No results for input(s): "PROBNP" in the last 8760 hours. HbA1C: No results for input(s): "HGBA1C" in the last 72 hours. CBG: No results for input(s): "GLUCAP" in the last 168 hours. Lipid Profile: No results for input(s): "CHOL", "HDL", "LDLCALC", "TRIG", "CHOLHDL", "LDLDIRECT" in the last 72 hours. Thyroid Function Tests: No results for input(s): "TSH", "T4TOTAL", "FREET4", "T3FREE", "THYROIDAB" in the last 72 hours. Anemia Panel: No results for input(s): "VITAMINB12", "FOLATE", "FERRITIN", "TIBC", "IRON", "RETICCTPCT" in the last 72 hours. Urine analysis:    Component Value Date/Time   COLORURINE YELLOW (A) 03/25/2022 2210   APPEARANCEUR CLEAR (A) 03/25/2022 2210   LABSPEC 1.016 03/25/2022 2210   PHURINE 5.0 03/25/2022 2210   GLUCOSEU NEGATIVE 03/25/2022 2210   GLUCOSEU NEGATIVE 01/22/2017 1052   HGBUR NEGATIVE 03/25/2022 2210   BILIRUBINUR NEGATIVE 03/25/2022 2210   KETONESUR NEGATIVE 03/25/2022 2210   PROTEINUR >=300 (A) 03/25/2022 2210   UROBILINOGEN 0.2 01/22/2017 1052   NITRITE NEGATIVE 03/25/2022 2210   LEUKOCYTESUR NEGATIVE 03/25/2022 2210   Sepsis Labs: @LABRCNTIP (procalcitonin:4,lacticidven:4) ) Recent Results (from the past 240 hour(s))  Resp panel by RT-PCR (RSV, Flu A&B, Covid) Anterior Nasal Swab     Status: None   Collection Time: 09/23/22   5:56 AM   Specimen: Anterior Nasal Swab  Result Value Ref Range Status   SARS Coronavirus 2 by RT PCR NEGATIVE NEGATIVE Final    Comment: (NOTE) SARS-CoV-2 target nucleic acids are NOT DETECTED.  The SARS-CoV-2 RNA is generally detectable in upper  respiratory specimens during the acute phase of infection. The lowest concentration of SARS-CoV-2 viral copies this assay can detect is 138 copies/mL. A negative result does not preclude SARS-Cov-2 infection and should not be used as the sole basis for treatment or other patient management decisions. A negative result may occur with  improper specimen collection/handling, submission of specimen other than nasopharyngeal swab, presence of viral mutation(s) within the areas targeted by this assay, and inadequate number of viral copies(<138 copies/mL). A negative result must be combined with clinical observations, patient history, and epidemiological information. The expected result is Negative.  Fact Sheet for Patients:  EntrepreneurPulse.com.au  Fact Sheet for Healthcare Providers:  IncredibleEmployment.be  This test is no t yet approved or cleared by the [place] FDA and  has been authorized for detection and/or diagnosis of SARS-CoV-2 by FDA under an Emergency Use Authorization (EUA). This EUA will remain  in effect (meaning this test can be used) for the duration of the COVID-19 declaration under Section 564(b)(1) of the Act, 21 U.S.C.section 360bbb-3(b)(1), unless the authorization is terminated  or revoked sooner.       Influenza A by PCR NEGATIVE NEGATIVE Final   Influenza B by PCR NEGATIVE NEGATIVE Final    Comment: (NOTE) The Xpert Xpress SARS-CoV-2/FLU/RSV plus assay is intended as an aid in the diagnosis of influenza from Nasopharyngeal swab specimens and should not be used as a sole basis for treatment. Nasal washings and aspirates are unacceptable for Xpert Xpress  SARS-CoV-2/FLU/RSV testing.  Fact Sheet for Patients: EntrepreneurPulse.com.au  Fact Sheet for Healthcare Providers: IncredibleEmployment.be  This test is not yet approved or cleared by the [place] FDA and has been authorized for detection and/or diagnosis of SARS-CoV-2 by FDA under an Emergency Use Authorization (EUA). This EUA will remain in effect (meaning this test can be used) for the duration of the COVID-19 declaration under Section 564(b)(1) of the Act, 21 U.S.C. section 360bbb-3(b)(1), unless the authorization is terminated or revoked.     Resp Syncytial Virus by PCR NEGATIVE NEGATIVE Final    Comment: (NOTE) Fact Sheet for Patients: EntrepreneurPulse.com.au  Fact Sheet for Healthcare Providers: IncredibleEmployment.be  This test is not yet approved or cleared by the [place] FDA and has been authorized for detection and/or diagnosis of SARS-CoV-2 by FDA under an Emergency Use Authorization (EUA). This EUA will remain in effect (meaning this test can be used) for the duration of the COVID-19 declaration under Section 564(b)(1) of the Act, 21 U.S.C. section 360bbb-3(b)(1), unless the authorization is terminated or revoked.  Performed at [facility], [address], [place], [place] [address]      Radiological Exams on Admission: DG Chest Portable 1 View  Result Date: 09/23/2022 CLINICAL DATA:  67 year old female with shortness of breath and chest pain for 6 hours. EXAM: PORTABLE CHEST 1 VIEW COMPARISON:  Chest radiographs 09/07/2022 and earlier. FINDINGS: Portable AP upright view at 0553 hours. Ongoing right pleural effusion, small to moderate and not significantly changed from earlier this month. Evidence of underlying cardiomegaly. Underlying moderate dextroconvex thoracic scoliosis. Increased pulmonary vascularity is stable. No pneumothorax, left pleural effusion, air  bronchograms. No acute osseous abnormality identified. Decreased visible bowel gas in the upper abdomen. IMPRESSION: 1. Suspected pulmonary interstitial edema and ongoing right pleural effusion, small to moderate. Not significantly changed from earlier this month. 2. Underlying cardiomegaly, scoliosis. 3. No new cardiopulmonary abnormality. Electronically Signed   By: Genevie Ann M.D.   On: 09/23/2022 06:06  Assessment/Plan Principal Problem:   Acute on chronic diastolic CHF (congestive heart failure) (HCC) Active Problems:   Acute on chronic respiratory failure with hypoxia (HCC)   Chronic obstructive pulmonary disease (HCC)   Myocardial injury   Chest pain   Paroxysmal atrial fibrillation (HCC)   CKD (chronic kidney disease), stage IV (HCC)   Hypertension   HLD (hyperlipidemia)   Tobacco use   Hypothyroidism   Depression with anxiety   Assessment and Plan:  Acute on chronic respiratory failure with hypoxia due to acute on chronic diastolic CHF (congestive heart failure) ([place]): Patient has some moderate acute respiratory distress, has new 3 L oxygen requirement.  Patient has leg edema, elevated BNP 387, fine crackles on auscultation, chest x-ray showed interstitial edema, clinically consistent with CHF exacerbation.  2D echo on 07/01/2022 showed EF of 50-55% with grade 2 diastolic dysfunction.  -Will admit to progressive unit as inpatient -Lasix 40 mg bid by IV (patient received 60 mg of Lasix in ED) -Daily weights -strict I/O's -Low salt diet -Fluid restriction -Obtain REDs Vest reading -Nasal cannula oxygen to maintain oxygen saturation above 93%  Chronic obstructive pulmonary disease [facility]): Patient has mild productive cough, no wheezing auscultation, but has severely decreased air movement on the right side.  Patient has leukocytosis with WBC 14.7, but no fever. Her procalcitonin < 0.10. Her leukocytosis may be due to recent steroid use.  Low suspicions for pneumonia.  Patient is  taking Eliquis consistently, low suspicions for PE.  Patient recently had 2 negative VQ scan on 09/07/2022, and 07/01/2022.  Will not repeat VQ scan today.  Patient received 1 dose of vancomycin and cefepime in ED, will not continue IV antibiotics.  -Bronchodilators -Solu-Medrol 40 mg IV bid -Doxycycline 100 mg twice daily -Mucinex for cough  -Incentive spirometry -sputum culture -Nasal cannula oxygen as needed to maintain O2 saturation 93% or greater -Urine S. pneumococcal antigen  Myocardial injury and chest pain: Her chest pain has resolved.  Patient has a minimally elevated troponin 19. -Trend troponin -Check A1c, FLP -As needed nitroglycerin -Lipitor -Aspirin  Paroxysmal atrial fibrillation (HCC): Heart rate 99 -Continue amiodarone, Cardizem -Continue Eliquis  CKD (chronic kidney disease), stage IV [facility]): Renal function stable.  Recent baseline creatinine 2.0-2.8.  Her creatinine is at 2.29, BUN 33, GFR 23 -Follow-up by BMP  Hypertension -IV hydralazine as needed -Continue home Cardizem -will hold oral hydralazine since pt's Bp becomes 122/61   HLD (hyperlipidemia) -Lipitor  Tobacco use: Patient states that she stopped smoking recently -Encouraged patient not to restart smoking cigarettes -Nicotine patch  Hypothyroidism: Patient's not taking Synthroid currently -Follow-up with PCP  Depression with anxiety -Continue home medications    DVT ppx: on Eliquis   Code Status: Full code  Family Communication:     Yes, patient's husband  by phone  Disposition Plan:  Anticipate discharge back to previous environment  Consults called:  none  Admission status and Level of care: Progressive:   as inpt      Dispo: The patient is from: Home              Anticipated d/c is to: Home              Anticipated d/c date is: 2 days              Patient currently is not medically stable to d/c.    Severity of Illness:  The appropriate patient status for this patient is  INPATIENT. Inpatient status is judged to  be reasonable and necessary in order to provide the required intensity of service to ensure the patient's safety. The patient's presenting symptoms, physical exam findings, and initial radiographic and laboratory data in the context of their chronic comorbidities is felt to place them at high risk for further clinical deterioration. Furthermore, it is not anticipated that the patient will be medically stable for discharge from the hospital within 2 midnights of admission.   * I certify that at the point of admission it is my clinical judgment that the patient will require inpatient hospital care spanning beyond 2 midnights from the point of admission due to high intensity of service, high risk for further deterioration and high frequency of surveillance required.*       Date of Service 09/23/2022    Ivor Costa [facility]   If 7PM-7AM, please contact night-coverage [email or web address] 09/23/2022, 9:01 AM -

## 2022-09-23 NOTE — ED Provider Notes (Signed)
[facility] Provider Note    Event Date/Time   First MD Initiated Contact with Patient 09/23/22 [phone]     (approximate)   History   Shortness of breath  HPI  Dawn Sherman is a 67 y.o. female who presents to the emergency department today because of concerns for shortness of breath.  She states that it started yesterday evening.  She has had associated chest pain with this.  She denies any fevers.  She has history of recent admission to the hospital for respiratory issues secondary to CHF and likely some component of COPD.  The patient is on oxygen but only at night.     Physical Exam   Triage Vital Signs: ED Triage Vitals  Enc Vitals Group     BP 09/23/22 0531 (!) 154/76     Pulse Rate 09/23/22 0531 99     Resp 09/23/22 0531 (!) 25     Temp 09/23/22 0531 97.6 F (36.4 C)     Temp Source 09/23/22 0531 Oral     SpO2 09/23/22 0529 98 %     Weight 09/23/22 0533 125 lb (56.7 kg)     Height 09/23/22 0533 5\' 5"  (1.651 m)     Head Circumference --      Peak Flow --      Pain Score 09/23/22 0532 2     Pain Loc --      Pain Edu? --      Excl. in [place]? --     Most recent vital signs: Vitals:   09/23/22 0529 09/23/22 0531  BP:  (!) 154/76  Pulse:  99  Resp:  (!) 25  Temp:  97.6 F (36.4 C)  SpO2: 98% 100%   General: Awake, alert, oriented. CV:  Good peripheral perfusion. Regular rate. Resp:   Increased work of breathing. Poor air movement diffusely Abd:  No distention.     ED Results / Procedures / Treatments   Labs (all labs ordered are listed, but only abnormal results are displayed) Labs Reviewed  CBC WITH DIFFERENTIAL/PLATELET - Abnormal; Notable for the following components:      Result Value   WBC 14.7 (*)    Hemoglobin 10.6 (*)    HCT 35.9 (*)    MCH 25.7 (*)    MCHC 29.5 (*)    RDW 17.1 (*)    Neutro Abs 12.1 (*)    Monocytes Absolute 1.4 (*)    All other components within normal limits  BASIC METABOLIC PANEL - Abnormal;  Notable for the following components:   Glucose, Bld 118 (*)    BUN 33 (*)    Creatinine, Ser 2.29 (*)    GFR, Estimated 23 (*)    All other components within normal limits  TROPONIN I (HIGH SENSITIVITY) - Abnormal; Notable for the following components:   Troponin I (High Sensitivity) 19 (*)    All other components within normal limits  RESP PANEL BY RT-PCR (RSV, FLU A&B, COVID)  [id number]  BRAIN NATRIURETIC PEPTIDE  PROCALCITONIN     EKG  I, Nance Pear, attending physician, personally viewed and interpreted this EKG  EKG Time: 0536 Rate: 95 Rhythm: sinus rhythm Axis: normal Intervals: qtc 463 QRS: narrow, q waves v1 ST changes: no st elevation, t wave inversion V5, V6 Impression: abnormal ekg    RADIOLOGY I independently interpreted and visualized the CXR. My interpretation: right sided pleural effusion Radiology interpretation:  IMPRESSION:  1. Suspected pulmonary interstitial edema and ongoing  right pleural  effusion, small to moderate. Not significantly changed from earlier  this month.  2. Underlying cardiomegaly, scoliosis.  3. No new cardiopulmonary abnormality.     PROCEDURES:  Critical Care performed: No  Procedures   MEDICATIONS ORDERED IN ED: Medications - No data to display   IMPRESSION / MDM / [employer] / ED COURSE  I reviewed the triage vital signs and the nursing notes.                              Differential diagnosis includes, but is not limited to, chf, copd, pneumonia, pneumothorax.  Patient's presentation is most consistent with acute presentation with potential threat to life or bodily function.  Patient presented to the emergency department today because of concerns for shortness of breath.  She states that she is typically only on oxygen at night, however for discharge summary from recent hospital does appear they met prior to being on it continuously.  Regardless on exam patient is having increased work of breathing.   Chest x-ray is concerning for pulmonary edema and BNP is elevated.  Will give patient dose of Lasix.  Additionally given elevated white blood cell count will give antibiotics.  Will plan on admission.    FINAL CLINICAL IMPRESSION(S) / ED DIAGNOSES   Final diagnoses:  SOB (shortness of breath)  Pneumonia due to infectious organism, unspecified laterality, unspecified part of lung       Note:  This document was prepared using Dragon voice recognition software and may include unintentional dictation errors.    Nance Pear, MD 09/23/22 [phone]

## 2022-09-23 NOTE — ED Notes (Signed)
Pt. States she takes amiodarone and cardizem at the same time every morning.

## 2022-09-23 NOTE — Consult Note (Addendum)
   Heart Failure Nurse Navigator Note  HFpEF 50-55%.  Grade 11 diastolic dysfunction.  Left atrium is mildly dilated.  Moderate mitral regurgitation.  Mild aortic stenosis.  She presented to the emergency room from home with complaints of worsening shortness of breath, and abdominal distention and lower extremity edema.  Chest x-ray revealed cardiomegaly with small to moderate right pleural effusion and interstitial edema.  BNP 3872 on last admission was 4096.  Comorbidities:  COPD O2 use at night of 3 L Hyperlipidemia GERD Depression Chronic kidney disease stage IV SDH Paroxysmal atrial fibrillation  Medications:  Amiodarone 200 mg daily Apixaban 2.5 mg 2 times a day Farxiga 10 mg daily Diltiazem 120 mg daily Hydralazine 25 mg 3 times a day Aspirin 81 mg daily Atorvastatin 40 mg daily Furosemide 40 mg IV every 12 hours  Labs:  Sodium 140, potassium 4.8, chloride 106, CO2 22, BUN 33, creatinine 2.29, estimated GFR 23 Weight documented at 56.7 kg but was not weighed in the emergency room, this is stated weight.  Initial meeting with patient on this admission, she had just been discharged from the hospital on January 18.  She has been compliant with her medications,  not missed any doses.  She states that she started to notice lower extremity swelling 2 days after she was home from the hospital.  She states that her weight did not really change but she had also seen some abdominal distention.  She states as per Dr. Donivan Scull instruction she took an extra 20 of Lasix so she was taken to 40 in the morning and 20 in the afternoon.  She states she had also started to wear support stockings to help with the edema.  Spoke with her about notifying the HF clinic when she sees changes in her symptoms  Went over fluid restriction, she states that she was drinking a cup of coffee in the morning and 1 with lunch and then would have a glass of water in the evening.  She states that she  had cut out her ice intake.  She states she had been compliant with her sodium intake, if she had to use something from a can, she rinsed it very well in a colander.  Questioning if she had increased her diuretic and still has problems with increasing fluid, does her diuretic need to be changed.  She had scheduled follow in the HF clinic on February 1 at 8:30 AM  Will continue to follow.  Pricilla Riffle RN [facility]

## 2022-09-23 NOTE — ED Triage Notes (Signed)
Pt states she began having sob and cp x 6 hrs. 6/10 dull pain. Hx copd and afib. 324asa given with ems. Wears o2 at night only.

## 2022-09-23 NOTE — ED Notes (Signed)
Pt. Given specimen cup for sputum, and one for urine specimen.

## 2022-09-24 DIAGNOSIS — I5033 Acute on chronic diastolic (congestive) heart failure: Secondary | ICD-10-CM | POA: Diagnosis not present

## 2022-09-24 LAB — LIPID PANEL
Cholesterol: 175 mg/dL (ref 0–200)
HDL: 87 mg/dL (ref >40)
LDL Cholesterol: 79 mg/dL (ref 0–99)
Total CHOL/HDL Ratio: 2 RATIO
Triglycerides: 46 mg/dL (ref <150)
VLDL: 9 mg/dL (ref 0–40)

## 2022-09-24 LAB — BASIC METABOLIC PANEL
Anion gap: 8 (ref 5–15)
BUN: 44 mg/dL — ABNORMAL HIGH (ref 8–23)
CO2: 23 mmol/L (ref 22–32)
Calcium: 8.5 mg/dL — ABNORMAL LOW (ref 8.9–10.3)
Chloride: 103 mmol/L (ref 98–111)
Creatinine, Ser: 2.49 mg/dL — ABNORMAL HIGH (ref 0.44–1.00)
GFR, Estimated: 21 mL/min — ABNORMAL LOW (ref >60)
Glucose, Bld: 150 mg/dL — ABNORMAL HIGH (ref 70–99)
Potassium: 4.2 mmol/L (ref 3.5–5.1)
Sodium: 134 mmol/L — ABNORMAL LOW (ref 135–145)

## 2022-09-24 LAB — MAGNESIUM: Magnesium: 2 mg/dL (ref 1.7–2.4)

## 2022-09-24 NOTE — Assessment & Plan Note (Signed)
--  IV hydralazine as needed --Continue home Cardizem --Held oral hydralazine to avoid hypotension on admission. Resume when BP tolerates

## 2022-09-24 NOTE — Assessment & Plan Note (Signed)
With acute exacerbation - severely decreased air movement, unable to hear wheezing as result.  No infilatrate on imaging to suggest PNA and negative procal.     --Continue IV steroids, doxycycline --Bronchodilators --Mucinex --Pulmonary hygiene --Follow sputum culture

## 2022-09-24 NOTE — Progress Notes (Signed)
Skin assessment completed by myself and Juliane Lack. The patient has some bruises to the upper/lower extremities. No other skin issues noted at this time.

## 2022-09-24 NOTE — Assessment & Plan Note (Signed)
Not currently on Synthroid. PCP follow up

## 2022-09-24 NOTE — Assessment & Plan Note (Signed)
Myocardial injury - demand ischemia with hypoxia most likely. Troponin only minimally elevated. Reported on admission, has resolved. Likely due to CHF and COPD and respiratory distress --Continue Aspirin, Lipitor --PRN nitro --Stat EKG and troponin if active chest pain occurs

## 2022-09-24 NOTE — Assessment & Plan Note (Signed)
HR controlled.  At risk for developing RVR with respiratory issues. Monitor closely. -Continue amiodarone, Cardizem -Continue Eliquis -Telemetry

## 2022-09-24 NOTE — Assessment & Plan Note (Signed)
- 

## 2022-09-24 NOTE — TOC Initial Note (Signed)
Transition of Care [facility]) - Initial/Assessment Note    Patient Details  Name: Dawn Sherman MRN: [id number] Date of Birth: Aug 02, 1956  [facility] CM/SW Contact:    Tiburcio Bash, LCSW Phone Number: 09/24/2022, 9:31 AM  Clinical Narrative:                  Recent readmission screen completed noted as follows:  Patient lives with her husband and teen grandson. PCP is Dr. [name]. [facility] in [place]. Patient can drive or her husband can provide transport when needed. No HH or DME history.  Patient previously set up with [facility] for home O2 last admission.   TOC will follow for needs.   Expected Discharge Plan: Home/Self Care Barriers to Discharge: Continued Medical Work up   Patient Goals and CMS Choice Patient states their goals for this hospitalization and ongoing recovery are:: to go home CMS Medicare.gov Compare Post Acute Care list provided to:: Patient Choice offered to / list presented to : Patient      Expected Discharge Plan and Services       Living arrangements for the past 2 months: Single Family Home                                      Prior Living Arrangements/Services Living arrangements for the past 2 months: Single Family Home Lives with:: Self                   Activities of Daily Living Home Assistive Devices/Equipment: Oxygen ADL Screening (condition at time of admission) Patient's cognitive ability adequate to safely complete daily activities?: Yes Is the patient deaf or have difficulty hearing?: No Does the patient have difficulty seeing, even when wearing glasses/contacts?: No Does the patient have difficulty concentrating, remembering, or making decisions?: No Patient able to express need for assistance with ADLs?: Yes Does the patient have difficulty dressing or bathing?: No Independently performs ADLs?: Yes (appropriate for developmental age) Does the patient have difficulty walking or climbing  stairs?: No Weakness of Legs: None Weakness of Arms/Hands: None  Permission Sought/Granted                  Emotional Assessment              Admission diagnosis:  SOB (shortness of breath) [R06.02] Acute on chronic diastolic CHF (congestive heart failure) ([place]) [I50.33] Pneumonia due to infectious organism, unspecified laterality, unspecified part of lung [J18.9] Patient Active Problem List   Diagnosis Date Noted   Acute on chronic diastolic CHF (congestive heart failure) ([place]) 09/23/2022   Chest pain 09/23/2022   Acute on chronic respiratory failure with hypoxia ([place]) 09/23/2022   (HFpEF) heart failure with preserved ejection fraction ([place]) 09/07/2022   Positive D dimer 09/07/2022   Depression with anxiety 09/07/2022   Hypothyroidism 09/07/2022   Epigastric pain 08/14/2022   SOB (shortness of breath) 07/16/2022   Fatigue 07/16/2022   Acute on chronic congestive heart failure ([place]) 07/04/2022   Acute hypoxemic respiratory failure ([place]) 06/30/2022   Tachycardia 06/30/2022   Anemia 05/06/2022   Chronic heart failure with preserved ejection fraction ([place]) 03/26/2022   Myocardial injury 03/26/2022   CKD (chronic kidney disease), stage IV ([place]) 03/26/2022   Iron deficiency anemia 03/26/2022   Generalized weakness 12/17/2021   Essential hypertension 12/17/2021   Dyslipidemia 12/17/2021   Depression, major, single episode,  complete remission ([place]) 12/17/2021   GERD without esophagitis 12/17/2021   Chronic diastolic CHF (congestive heart failure) ([place]) 12/17/2021   AKI (acute kidney injury) ([place]) 12/16/2021   Second degree AV block    History of subarachnoid hemorrhage 12/10/2021   Hypokalemia 12/10/2021   Hyponatremia 12/10/2021   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   Persistent atrial fibrillation ([place]) 12/10/2021   Hypotension 12/10/2021   Swelling of lower extremity 10/28/2021   Prediabetes 08/19/2021   Chronic obstructive pulmonary disease ([place]) 11/22/2020    Paroxysmal atrial fibrillation ([place]) 08/12/2020   Dehydration    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[place])    Acute on chronic combined systolic and diastolic CHF (congestive heart failure) ([place]) 02/10/2020   Leukocytosis 02/10/2020   HLD (hyperlipidemia) 02/10/2020   Elevated troponin 02/10/2020   GERD (gastroesophageal reflux disease) 02/10/2020   Acute respiratory failure with hypoxia ([place]) 02/10/2020   Abnormal mammogram 07/13/2019   History of alcohol abuse 07/31/2018   B12 deficiency 07/31/2018   Acute renal failure superimposed on stage 3b chronic kidney disease ([place]) 11/03/2016   Health care maintenance 12/02/2014   Tobacco use 12/02/2014   Hypercholesterolemia 11/29/2014   Encounter for screening colonoscopy 12/16/2013   Breast mass 12/15/2012   Hypertension 07/05/2012   Depression, major, single episode, mild ([place]) 07/05/2012   PCP:  [name], MD Pharmacy:   [facility] [place], [place] [address] [place] [address] Phone: [phone] Fax: [phone]     Social Determinants of Health (SDOH) Social History: SDOH Screenings   Food Insecurity: No Food Insecurity (09/23/2022)  Housing: Low Risk  (09/23/2022)  Transportation Needs: No Transportation Needs (09/23/2022)  Utilities: Not At Risk (09/23/2022)  Depression (PHQ2-9): Low Risk  (08/14/2022)  Financial Resource Strain: Low Risk  (11/27/2021)  Physical Activity: Sufficiently Active (11/27/2021)  Social Connections: Unknown (11/27/2021)  Stress: No Stress Concern Present (11/27/2021)  Tobacco Use: Medium Risk (09/23/2022)   SDOH Interventions:     Readmission Risk Interventions    07/02/2022   12:26 PM 12/11/2021    3:17 PM  Readmission Risk Prevention Plan  Transportation Screening Complete Complete  PCP or Specialist Appt within 3-5 Days  Complete  HRI or [place]  Complete  Social Work Consult for [place] Planning/Counseling  Complete   Palliative Care Screening  Not Applicable  Medication Review Press photographer) Complete Complete  PCP or Specialist appointment within 3-5 days of discharge Complete   SW Recovery Care/Counseling Consult Complete   [place] Not Applicable

## 2022-09-24 NOTE — Progress Notes (Addendum)
ReDS Vest / Clip - 09/24/22 1028       ReDS Vest / Clip   Station Marker A    Ruler Value 31    ReDS Actual Value 36

## 2022-09-24 NOTE — Assessment & Plan Note (Addendum)
Recently started on 3 L/min nocturnal O2 (at discharge earlier this month).  Requiring 3 L/min continuous at time of admission with moderate respiratory distress due to combination CHF decompensation and COPD exacerbation.  Patient recently had 2 negative VQ scan on 09/07/2022, and 07/01/2022 and is on Eliquis, very unlikely to have a PE. --Supplement O2, wean as tolerated --Target O2 sats 88-94% --Mgmt of underlying issues as outlined --Incentive spirometry

## 2022-09-24 NOTE — Assessment & Plan Note (Signed)
Renal function stable.  Recent baseline creatinine 2.0-2.8.   Cr on admission was 2.29. --Follow BMP --Avoid nephrotoxins, hypotension, IV contrast --renally dose meds

## 2022-09-24 NOTE — Assessment & Plan Note (Signed)
Nicotine patch. She reports recently stopped smoking.

## 2022-09-24 NOTE — Assessment & Plan Note (Signed)
Lipitor

## 2022-09-24 NOTE — Progress Notes (Signed)
Progress Note   Patient: Dawn Sherman WEX:[id number] DOB: July 05, 1956 DOA: 09/23/2022     1 DOS: the patient was seen and examined on 09/24/2022   Brief hospital course:  Dawn Sherman is a 67 y.o. female with medical history significant of COPD on prn 3L of oxygen at night, hypertension, hyperlipidemia, GERD, depression, tobacco abuse, alcohol abuse in remission, CKD-IV,  dCHF (used to have sCHF with EF 30-35%, improved to EF 50-55 by 2D Echo on 07/01/22), SDH, PAF on Eliquis, second-degree AV block, presented on 09/23/2022 for evaluation of worsening SOB since the night before, with productive cough, wheezing, chest pain which had resolved by time of admission.  Patient was recently hospitalized from 1/13 - 1/18 due to COPD exacerbation, discharged on 3 L/min nocturnal oxygen (no prior oxygen requirement before that admission).  Also discharged on prednisone.  She reported progressively worsening lower extremity edema since d/c as well.  She presented in moderate acute respiratory distress, using accessory muscle for breathing, hardly speaking in full sentences, tachypneic.  Started on 3 L continuous oxygen with 98-100% saturation. Chest x-ray showed cardiomegaly, small to moderate right pleural effusion, interstitial edema.   She was admitted to medicine service for further evaluation and management of acute respiratory failure with hypoxia due to combination of COPD and decompensated CHF with edema.   Started on IV diuresis and IV steroids.  Assessment and Plan: * Acute on chronic diastolic CHF (congestive heart failure) (HCC) Presented with has leg edema, elevated BNP 387, fine crackles on auscultation, chest x-ray showed interstitial edema, clinically consistent with CHF exacerbation.  2D echo on 07/01/2022 showed EF of 50-55% with grade 2 diastolic dysfunction.  --Continue Lasix 40 mg bid  --Daily weights & strict I/O's --Low sodium diet & fluid restriction --REDs Vest reading 36 consistent  with moderate volume overload  Acute on chronic respiratory failure with hypoxia (HCC) Recently started on 3 L/min nocturnal O2 (at discharge earlier this month).  Requiring 3 L/min continuous at time of admission with moderate respiratory distress due to combination CHF decompensation and COPD exacerbation.  Patient recently had 2 negative VQ scan on 09/07/2022, and 07/01/2022 and is on Eliquis, very unlikely to have a PE. --Supplement O2, wean as tolerated --Target O2 sats 88-94% --Mgmt of underlying issues as outlined --Incentive spirometry  Chronic obstructive pulmonary disease ([place]) With acute exacerbation - severely decreased air movement, unable to hear wheezing as result.  No infilatrate on imaging to suggest PNA and negative procal.     --Continue IV steroids, doxycycline --Bronchodilators --Mucinex --Pulmonary hygiene --Follow sputum culture  Chest pain Myocardial injury - demand ischemia with hypoxia most likely. Troponin only minimally elevated. Reported on admission, has resolved. Likely due to CHF and COPD and respiratory distress --Continue Aspirin, Lipitor --PRN nitro --Stat EKG and troponin if active chest pain occurs  Myocardial injury See chest pain  Paroxysmal atrial fibrillation (HCC) HR controlled.  At risk for developing RVR with respiratory issues. Monitor closely. -Continue amiodarone, Cardizem -Continue Eliquis -Telemetry  CKD (chronic kidney disease), stage IV (HCC) Renal function stable.  Recent baseline creatinine 2.0-2.8.   Cr on admission was 2.29. --Follow BMP --Avoid nephrotoxins, hypotension, IV contrast --renally dose meds  Hypertension --IV hydralazine as needed --Continue home Cardizem --Held oral hydralazine to avoid hypotension on admission. Resume when BP tolerates  HLD (hyperlipidemia) Lipitor  Tobacco use Nicotine patch. She reports recently stopped smoking.  Hypothyroidism Not currently on Synthroid. PCP follow  up  Depression with anxiety Continue home  medications         Subjective: Pt awake sitting up in bed this AM.  Reports breathing a little better today, but still more short of breath that usual.  Her leg swelling has improved a great deal since admission.  Denies chest pain, fever/chills.  No other acute complaints.     Physical Exam: Vitals:   09/24/22 0528 09/24/22 0837 09/24/22 1028 09/24/22 1243  BP: 137/84 136/80  123/76  Pulse: (!) 113 (!) 111  97  Resp: 20 18  16   Temp: 98.4 F (36.9 C) 98.3 F (36.8 C)  98.5 F (36.9 C)  TempSrc: Oral     SpO2: 98% 95%  95%  Weight:   56.7 kg   Height:       General exam: awake, alert, no acute distress HEENT: atraumatic, clear conjunctiva, anicteric sclera, moist mucus membranes, hearing grossly normal  Respiratory system: very diminished air movement, faint expiratory wheezes, no rhonchi, normal respiratory effort at rest on 3 L/min [place] O2. Cardiovascular system: normal S1/S2, RRR, +JVP, no pedal edema.   Gastrointestinal system: soft, NT, ND, no HSM felt, +bowel sounds. Central nervous system: A&O x4. no gross focal neurologic deficits, normal speech Extremities: moves all, no edema, normal tone Skin: dry, intact, normal temperature, normal color, No rashes, lesions or ulcers Psychiatry: normal mood, congruent affect, judgement and insight appear normal   Data Reviewed:  Notable labs ---   Na 134 from 140, glucose 150, BUN 44, Cr 2.49, Ca 8.5, GFR 21.  Normal lipid panel.  Lactic acid 1.0, 1.2.  Procal < 0.10.  Negative strep pneumo antigen  Family Communication: None  Disposition: Status is: Inpatient Remains inpatient appropriate because: Remains on IV therapies as above with persistent oxygen need above baseline.  Requires further improvement and close monitoring   Planned Discharge Destination: Home    Time spent: 42 minutes  Author: Ezekiel Slocumb, DO 09/24/2022 2:03 PM  For on call review [email or web address].

## 2022-09-24 NOTE — Assessment & Plan Note (Signed)
See chest pain

## 2022-09-24 NOTE — Assessment & Plan Note (Signed)
Presented with has leg edema, elevated BNP 387, fine crackles on auscultation, chest x-ray showed interstitial edema, clinically consistent with CHF exacerbation.  2D echo on 07/01/2022 showed EF of 50-55% with grade 2 diastolic dysfunction.  --Stop Lasix 40 mg IV bid  --Daily weights & strict I/O's --Low sodium diet & fluid restriction --REDs Vest reading 36 consistent with moderate volume overload

## 2022-09-24 NOTE — Hospital Course (Signed)
Dawn Sherman is a 67 y.o. female with medical history significant of COPD on prn 3L of oxygen at night, hypertension, hyperlipidemia, GERD, depression, tobacco abuse, alcohol abuse in remission, CKD-IV,  dCHF (used to have sCHF with EF 30-35%, improved to EF 50-55 by 2D Echo on 07/01/22), SDH, PAF on Eliquis, second-degree AV block, presented on 09/23/2022 for evaluation of worsening SOB since the night before, with productive cough, wheezing, chest pain which had resolved by time of admission.  Patient was recently hospitalized from 1/13 - 1/18 due to COPD exacerbation, discharged on 3 L/min nocturnal oxygen (no prior oxygen requirement before that admission).  Also discharged on prednisone.  She reported progressively worsening lower extremity edema since d/c as well.  She presented in moderate acute respiratory distress, using accessory muscle for breathing, hardly speaking in full sentences, tachypneic.  Started on 3 L continuous oxygen with 98-100% saturation. Chest x-ray showed cardiomegaly, small to moderate right pleural effusion, interstitial edema.   She was admitted to medicine service for further evaluation and management of acute respiratory failure with hypoxia due to combination of COPD and decompensated CHF with edema.   Started on IV diuresis and IV steroids.

## 2022-09-24 NOTE — Progress Notes (Addendum)
   Heart Failure Nurse Navigator Note  Met with patient today she is lying in bed in no acute distress.  She remains on O2 per nasal cannula.  She states that she has been ambulating to the bathroom without any problem.  Discussed how she is going to take care of herself after being discharged home with better understanding of her medications and dosages and why she takes them along with the importance of daily weights and what to report.  Also reporting changes in symptoms.  Discussed having better communication with the outpatient heart failure clinic when she feels that she is getting into trouble or has questions.  Also made aware of the medication Furoisx as she states that she could not always travel to 30 miles into the outpatient heart failure clinic to receive IV Lasix.  Voices understanding.  Discussed doing a Reds clip vest reading.  She is in agreement.  Reds vest clip 36%. Explained reading resutls. Moderate volume overload.  She had no further questions.  Pricilla Riffle RN [facility]

## 2022-09-25 DIAGNOSIS — I5033 Acute on chronic diastolic (congestive) heart failure: Secondary | ICD-10-CM | POA: Diagnosis not present

## 2022-09-25 LAB — CBC
HCT: 30.3 % — ABNORMAL LOW (ref 36.0–46.0)
Hemoglobin: 9.5 g/dL — ABNORMAL LOW (ref 12.0–15.0)
MCH: 26.3 pg (ref 26.0–34.0)
MCHC: 31.4 g/dL (ref 30.0–36.0)
MCV: 83.9 fL (ref 80.0–100.0)
Platelets: 260 10*3/uL (ref 150–400)
RBC: 3.61 MIL/uL — ABNORMAL LOW (ref 3.87–5.11)
RDW: 17.1 % — ABNORMAL HIGH (ref 11.5–15.5)
WBC: 13.2 10*3/uL — ABNORMAL HIGH (ref 4.0–10.5)
nRBC: 0 % (ref 0.0–0.2)

## 2022-09-25 LAB — BASIC METABOLIC PANEL
Anion gap: 10 (ref 5–15)
BUN: 53 mg/dL — ABNORMAL HIGH (ref 8–23)
CO2: 22 mmol/L (ref 22–32)
Calcium: 8.9 mg/dL (ref 8.9–10.3)
Chloride: 105 mmol/L (ref 98–111)
Creatinine, Ser: 2.64 mg/dL — ABNORMAL HIGH (ref 0.44–1.00)
GFR, Estimated: 19 mL/min — ABNORMAL LOW (ref >60)
Glucose, Bld: 153 mg/dL — ABNORMAL HIGH (ref 70–99)
Potassium: 4.3 mmol/L (ref 3.5–5.1)
Sodium: 137 mmol/L (ref 135–145)

## 2022-09-25 MED ORDER — ADULT MULTIVITAMIN W/MINERALS CH
1.0000 | ORAL_TABLET | Freq: Every day | ORAL | Status: DC
  Administered 2022-09-25 – 2022-10-01 (×7): 1 via ORAL
  Filled 2022-09-25 (×7): qty 1

## 2022-09-25 MED ORDER — FLUTICASONE FUROATE-VILANTEROL 100-25 MCG/ACT IN AEPB
1.0000 | INHALATION_SPRAY | Freq: Every day | RESPIRATORY_TRACT | Status: DC
  Administered 2022-09-25 – 2022-10-01 (×7): 1 via RESPIRATORY_TRACT
  Filled 2022-09-25: qty 28

## 2022-09-25 NOTE — Progress Notes (Addendum)
ReDS Vest / Clip - 09/25/22 0900       ReDS Vest / Clip   Station Marker A    Ruler Value 31    ReDS Value Range Low volume    ReDS Actual Value 29

## 2022-09-25 NOTE — Progress Notes (Signed)
   Heart Failure Nurse Navigator Note  Teach back method went over daily weights, what to report, sodium and fluid restriction.  She needed no reinforcement.  She states she knows he needs to call the heart failure clinic when she notices weight gain or changes in her symptoms.  She had no further questions.  Pricilla Riffle RN [facility]

## 2022-09-25 NOTE — Discharge Instructions (Signed)

## 2022-09-25 NOTE — Progress Notes (Signed)
Progress Note   Patient: Dawn Sherman DOB: 05-26-1956 DOA: 09/23/2022     2 DOS: the patient was seen and examined on 09/25/2022   Brief hospital course:  Dawn Sherman is a 67 y.o. female with medical history significant of COPD on prn 3L of oxygen at night, hypertension, hyperlipidemia, GERD, depression, tobacco abuse, alcohol abuse in remission, CKD-IV,  dCHF (used to have sCHF with EF 30-35%, improved to EF 50-55 by 2D Echo on 07/01/22), SDH, PAF on Eliquis, second-degree AV block, presented on 09/23/2022 for evaluation of worsening SOB since the night before, with productive cough, wheezing, chest pain which had resolved by time of admission.  Patient was recently hospitalized from 1/13 - 1/18 due to COPD exacerbation, discharged on 3 L/min nocturnal oxygen (no prior oxygen requirement before that admission).  Also discharged on prednisone.  She reported progressively worsening lower extremity edema since d/c as well.  She presented in moderate acute respiratory distress, using accessory muscle for breathing, hardly speaking in full sentences, tachypneic.  Started on 3 L continuous oxygen with 98-100% saturation. Chest x-ray showed cardiomegaly, small to moderate right pleural effusion, interstitial edema.   She was admitted to medicine service for further evaluation and management of acute respiratory failure with hypoxia due to combination of COPD and decompensated CHF with edema.   Started on IV diuresis and IV steroids.  Assessment and Plan: * Acute on chronic diastolic CHF (congestive heart failure) (HCC) Presented with has leg edema, elevated BNP 387, fine crackles on auscultation, chest x-ray showed interstitial edema, clinically consistent with CHF exacerbation.  2D echo on 07/01/2022 showed EF of 50-55% with grade 2 diastolic dysfunction.  --Stop Lasix 40 mg IV bid  --Daily weights & strict I/O's --Low sodium diet & fluid restriction --REDs Vest reading 36 consistent  with moderate volume overload  Acute on chronic respiratory failure with hypoxia (HCC) Recently started on 3 L/min nocturnal O2 (at discharge earlier this month).  Requiring 3 L/min continuous at time of admission with moderate respiratory distress due to combination CHF decompensation and COPD exacerbation.  Patient recently had 2 negative VQ scan on 09/07/2022, and 07/01/2022 and is on Eliquis, very unlikely to have a PE. --Supplement O2, wean as tolerated --Target O2 sats 88-94% --Mgmt of underlying issues as outlined --Incentive spirometry  Chronic obstructive pulmonary disease ([place]) With acute exacerbation - severely decreased air movement, unable to hear wheezing as result.  No infilatrate on imaging to suggest PNA and negative procal.     --Continue IV steroids, doxycycline --Bronchodilators --Mucinex --Pulmonary hygiene --Follow sputum culture  Chest pain Myocardial injury - demand ischemia with hypoxia most likely. Troponin only minimally elevated. Reported on admission, has resolved. Likely due to CHF and COPD and respiratory distress --Continue Aspirin, Lipitor --PRN nitro --Stat EKG and troponin if active chest pain occurs  Myocardial injury See chest pain  Paroxysmal atrial fibrillation (HCC) HR controlled.  At risk for developing RVR with respiratory issues. Monitor closely. -Continue amiodarone, Cardizem -Continue Eliquis -Telemetry  CKD (chronic kidney disease), stage IV (HCC) Renal function stable.  Recent baseline creatinine 2.0-2.8.   Cr on admission was 2.29. --Follow BMP --Avoid nephrotoxins, hypotension, IV contrast --renally dose meds  Hypertension --IV hydralazine as needed --Continue home Cardizem --Held oral hydralazine to avoid hypotension on admission. Resume when BP tolerates  HLD (hyperlipidemia) Lipitor  Tobacco use Nicotine patch. She reports recently stopped smoking.  Hypothyroidism Not currently on Synthroid. PCP follow  up  Depression with anxiety Continue  home medications         Subjective: Pt up in recliner when seen this AM. She reports feeling improved but still more short of breath than basleine and still needing oxygen around the clock.  She denies fever/chills.      Physical Exam: Vitals:   09/25/22 0804 09/25/22 0806 09/25/22 1127 09/25/22 1406  BP: 127/78  (!) 140/80   Pulse: 97 (!) 104 71 87  Resp: 20 18 20 18   Temp: 97.6 F (36.4 C)  98.1 F (36.7 C)   TempSrc: Oral  Oral   SpO2: 99% 96% 97%   Weight:      Height:       General exam: awake, alert, no acute distress HEENT: atraumatic, clear conjunctiva, anicteric sclera, moist mucus membranes, hearing grossly normal  Respiratory system: some improvement in air movement, not currently wheezing, no rhonchi, normal respiratory effort at rest on 3 L/min [place] O2. Cardiovascular system: normal S1/S2, RRR, no pedal edema.   Gastrointestinal system: soft, NT, ND Central nervous system: A&O x4. no gross focal neurologic deficits, normal speech Extremities: moves all, no edema, normal tone Skin: dry, intact, normal temperature Psychiatry: normal mood, congruent affect, judgement and insight appear normal   Data Reviewed:  Notable labs ---   glucose 153, BUN 53 from 44, Cr 2.64 from 2.49, WBC 13.2 from 14.7.  Hb g9.5 from 10.6  Family Communication: None  Disposition: Status is: Inpatient Remains inpatient appropriate because: Remains on IV therapies as above with persistent oxygen need above baseline.  Requires further improvement and close monitoring   Planned Discharge Destination: Home    Time spent: 36 minutes  Author: Ezekiel Slocumb, DO 09/25/2022 3:13 PM  For on call review [email or web address].

## 2022-09-26 ENCOUNTER — Encounter: Payer: Medicare Other | Admitting: Family

## 2022-09-26 DIAGNOSIS — I5033 Acute on chronic diastolic (congestive) heart failure: Secondary | ICD-10-CM | POA: Diagnosis not present

## 2022-09-26 LAB — BASIC METABOLIC PANEL
Anion gap: 7 (ref 5–15)
BUN: 64 mg/dL — ABNORMAL HIGH (ref 8–23)
CO2: 27 mmol/L (ref 22–32)
Calcium: 9.1 mg/dL (ref 8.9–10.3)
Chloride: 104 mmol/L (ref 98–111)
Creatinine, Ser: 2.72 mg/dL — ABNORMAL HIGH (ref 0.44–1.00)
GFR, Estimated: 19 mL/min — ABNORMAL LOW (ref >60)
Glucose, Bld: 147 mg/dL — ABNORMAL HIGH (ref 70–99)
Potassium: 4.6 mmol/L (ref 3.5–5.1)
Sodium: 138 mmol/L (ref 135–145)

## 2022-09-26 MED ORDER — GUAIFENESIN 100 MG/5ML PO LIQD
5.0000 mL | ORAL | Status: DC | PRN
  Administered 2022-09-26: 5 mL via ORAL
  Filled 2022-09-26: qty 10

## 2022-09-26 MED ORDER — IPRATROPIUM-ALBUTEROL 0.5-2.5 (3) MG/3ML IN SOLN: 3.0000 mL | RESPIRATORY_TRACT | Status: DC

## 2022-09-26 NOTE — Progress Notes (Signed)
Progress Note   Patient: Dawn Sherman DOB: 1955/11/10 DOA: 09/23/2022     3 DOS: the patient was seen and examined on 09/26/2022   Brief hospital course:  Dawn Sherman is a 67 y.o. female with medical history significant of COPD on prn 3L of oxygen at night, hypertension, hyperlipidemia, GERD, depression, tobacco abuse, alcohol abuse in remission, CKD-IV,  dCHF (used to have sCHF with EF 30-35%, improved to EF 50-55 by 2D Echo on 07/01/22), SDH, PAF on Eliquis, second-degree AV block, presented on 09/23/2022 for evaluation of worsening SOB since the night before, with productive cough, wheezing, chest pain which had resolved by time of admission.  Patient was recently hospitalized from 1/13 - 1/18 due to COPD exacerbation, discharged on 3 L/min nocturnal oxygen (no prior oxygen requirement before that admission).  Also discharged on prednisone.  She reported progressively worsening lower extremity edema since d/c as well.  She presented in moderate acute respiratory distress, using accessory muscle for breathing, hardly speaking in full sentences, tachypneic.  Started on 3 L continuous oxygen with 98-100% saturation. Chest x-ray showed cardiomegaly, small to moderate right pleural effusion, interstitial edema.   She was admitted to medicine service for further evaluation and management of acute respiratory failure with hypoxia due to combination of COPD and decompensated CHF with edema.   Started on IV diuresis and IV steroids.  09/26/2022: The patient was seen and examined at bedside.  States her breathing is improved however she is worried about going home too soon.  Will do home O2 evaluation today.  Advised to use her incentive spirometer frequently.  Assessment and Plan: * Acute on chronic diastolic CHF (congestive heart failure) (HCC) Presented with has leg edema, elevated BNP 387, fine crackles on auscultation, chest x-ray showed interstitial edema, clinically consistent with CHF  exacerbation.  2D echo on 07/01/2022 showed EF of 50-55% with grade 2 diastolic dysfunction.  --Stop Lasix 40 mg IV bid  --Daily weights & strict I/O's --Low sodium diet & fluid restriction --REDs Vest reading 36 consistent with moderate volume overload Net I&O+ 2.4 L.  Acute on chronic respiratory failure with hypoxia (HCC) Recently started on 3 L/min nocturnal O2 (at discharge earlier this month).  Requiring 3 L/min continuous at time of admission with moderate respiratory distress due to combination CHF decompensation and COPD exacerbation.  Patient recently had 2 negative VQ scan on 09/07/2022, and 07/01/2022 and is on Eliquis, very unlikely to have a PE. --Supplement O2, wean as tolerated --Target O2 sats 88-94% --Mgmt of underlying issues as outlined --Incentive spirometry Maintain O2 saturation greater than 90%  Chronic obstructive pulmonary disease ([place]) With acute exacerbation - severely decreased air movement, unable to hear wheezing as result.  No infilatrate on imaging to suggest PNA and negative procal.     --Continue IV steroids, doxycycline --Bronchodilators --Mucinex --Pulmonary hygiene --Follow sputum culture  Chest pain Myocardial injury - demand ischemia with hypoxia most likely. Troponin only minimally elevated. Reported on admission, has resolved. Likely due to CHF and COPD and respiratory distress --Continue Aspirin, Lipitor --PRN nitro --Stat EKG and troponin if active chest pain occurs  Myocardial injury See chest pain  Paroxysmal atrial fibrillation (HCC) HR controlled.  At risk for developing RVR with respiratory issues. Monitor closely. -Continue amiodarone, Cardizem -Continue Eliquis -Telemetry  CKD (chronic kidney disease), stage IV (HCC) Renal function stable.  Recent baseline creatinine 2.0-2.8.   Cr on admission was 2.29. --Follow BMP --Avoid nephrotoxins, hypotension, IV contrast --renally dose meds  Hypertension --IV hydralazine as  needed --Continue home Cardizem --Held oral hydralazine to avoid hypotension on admission. Resume when BP tolerates  HLD (hyperlipidemia) Lipitor  Tobacco use Nicotine patch. She reports recently stopped smoking.  Hypothyroidism Not currently on Synthroid. PCP follow up  Depression with anxiety Continue home medications        Subjective: Pt up in recliner when seen this AM. She reports feeling improved but still more short of breath than basleine and still needing oxygen around the clock.  She denies fever/chills.      Physical Exam: Vitals:   09/26/22 0832 09/26/22 0905 09/26/22 1152 09/26/22 1358  BP: (!) 133/91  (!) 135/102   Pulse: 100  83 (!) 105  Resp: 16  16 20   Temp: 97.7 F (36.5 C)  97.7 F (36.5 C)   TempSrc:      SpO2: 95%  95% 95%  Weight:  57 kg    Height:       General exam: awake, alert, no acute distress HEENT: atraumatic, clear conjunctiva, anicteric sclera, moist mucus membranes, hearing grossly normal  Respiratory system: some improvement in air movement, not currently wheezing, no rhonchi, normal respiratory effort at rest on 3 L/min [place] O2. Cardiovascular system: normal S1/S2, RRR, no pedal edema.   Gastrointestinal system: soft, NT, ND Central nervous system: A&O x4. no gross focal neurologic deficits, normal speech Extremities: moves all, no edema, normal tone Skin: dry, intact, normal temperature Psychiatry: normal mood, congruent affect, judgement and insight appear normal   Data Reviewed:  Notable labs ---   glucose 153, BUN 53 from 44, Cr 2.64 from 2.49, WBC 13.2 from 14.7.  Hb g9.5 from 10.6  Family Communication: None  Disposition: Status is: Inpatient Remains inpatient appropriate because: Remains on IV therapies as above with persistent oxygen need above baseline.  Requires further improvement and close monitoring   Planned Discharge Destination: Home    Time spent: 36 minutes  Author: Kayleen Memos, DO 09/26/2022 3:49  PM  For on call review [email or web address].

## 2022-09-26 NOTE — Care Management Important Message (Signed)
Important Message  Patient Details  Name: ALLYCIA PITZ MRN: [id number] Date of Birth: [birth year]   Medicare Important Message Given:  Yes     [name] 09/26/2022, 2:50 PM

## 2022-09-27 ENCOUNTER — Inpatient Hospital Stay: Payer: Medicare Other

## 2022-09-27 DIAGNOSIS — I5033 Acute on chronic diastolic (congestive) heart failure: Secondary | ICD-10-CM | POA: Diagnosis not present

## 2022-09-27 LAB — BASIC METABOLIC PANEL
Anion gap: 10 (ref 5–15)
BUN: 84 mg/dL — ABNORMAL HIGH (ref 8–23)
CO2: 23 mmol/L (ref 22–32)
Calcium: 9.4 mg/dL (ref 8.9–10.3)
Chloride: 103 mmol/L (ref 98–111)
Creatinine, Ser: 2.67 mg/dL — ABNORMAL HIGH (ref 0.44–1.00)
GFR, Estimated: 19 mL/min — ABNORMAL LOW (ref >60)
Glucose, Bld: 145 mg/dL — ABNORMAL HIGH (ref 70–99)
Potassium: 5.4 mmol/L — ABNORMAL HIGH (ref 3.5–5.1)
Sodium: 136 mmol/L (ref 135–145)

## 2022-09-27 LAB — BRAIN NATRIURETIC PEPTIDE: B Natriuretic Peptide: >4500 pg/mL — ABNORMAL HIGH (ref 0.0–100.0)

## 2022-09-27 LAB — PROCALCITONIN: Procalcitonin: <0.1 ng/mL

## 2022-09-27 LAB — PHOSPHORUS: Phosphorus: 4.8 mg/dL — ABNORMAL HIGH (ref 2.5–4.6)

## 2022-09-27 MED ORDER — METHYLPREDNISOLONE SODIUM SUCC 40 MG IJ SOLR
40.0000 mg | Freq: Once | INTRAMUSCULAR | Status: AC
  Administered 2022-09-27: 40 mg via INTRAVENOUS
  Filled 2022-09-27: qty 1

## 2022-09-27 MED ORDER — MELATONIN 5 MG PO TABS
2.5000 mg | ORAL_TABLET | Freq: Every evening | ORAL | Status: DC | PRN
  Administered 2022-09-28: 2.5 mg via ORAL
  Filled 2022-09-27: qty 1

## 2022-09-27 MED ORDER — POLYETHYLENE GLYCOL 3350 17 G PO PACK
17.0000 g | PACK | Freq: Every day | ORAL | Status: DC
  Administered 2022-09-27 – 2022-09-30 (×4): 17 g via ORAL
  Filled 2022-09-27 (×5): qty 1

## 2022-09-27 MED ORDER — SODIUM CHLORIDE 0.9 % IV SOLN
2.0000 g | INTRAVENOUS | Status: DC
  Administered 2022-09-27 – 2022-09-30 (×4): 2 g via INTRAVENOUS
  Filled 2022-09-27 (×3): qty 20
  Filled 2022-09-27: qty 2

## 2022-09-27 MED ORDER — FUROSEMIDE 10 MG/ML IJ SOLN
40.0000 mg | Freq: Once | INTRAMUSCULAR | Status: AC
  Administered 2022-09-27: 40 mg via INTRAVENOUS
  Filled 2022-09-27: qty 4

## 2022-09-27 MED ORDER — ORAL CARE MOUTH RINSE: 15.0000 mL | OROMUCOSAL | Status: DC | PRN

## 2022-09-27 NOTE — Progress Notes (Signed)
    ReDS Vest / Clip - 09/27/22 1300       ReDS Vest / Clip   Station Marker A    Ruler Value 31    ReDS Value Range Low volume    ReDS Actual Value 32

## 2022-09-27 NOTE — Progress Notes (Addendum)
   Heart Failure Nurse Navigator Note  Spoke with patient this afternoon about her appointment in the heart failure clinic scheduled for this coming Tuesday February 6.  She states it does not look like she will be going home today or tomorrow as they have had to go back up on her oxygen   Moved her appointment to February 20 as she has pcp appointment on the 8, appointment with Dr. Rockey Situ on the 55.  Reviewed patient labs, BNP drawn today is >4500.  There was not document intake and output for yesterday.  Reds vest clip performed.  She felt she has been compliant with the fluid restriction.  She is awaiting the results of her chest CT.  Pricilla Riffle RN [facility]

## 2022-09-27 NOTE — Progress Notes (Addendum)
Progress Note   Patient: Dawn Sherman WUJ:[id number] DOB: [birth year] DOA: 09/23/2022     4 DOS: the patient was seen and examined on 09/27/2022   Brief hospital course:  Dawn Sherman is a 67 y.o. female with medical history significant of COPD on prn 3L of oxygen at night, hypertension, hyperlipidemia, GERD, depression, tobacco abuse, alcohol abuse in remission, CKD-IV,  dCHF (used to have sCHF with EF 30-35%, improved to EF 50-55 by 2D Echo on 07/01/22), SDH, PAF on Eliquis, second-degree AV block, presented on 09/23/2022 for evaluation of worsening SOB since the night before, with productive cough, wheezing, chest pain which had resolved by time of admission.  Patient was recently hospitalized from 1/13 - 1/18 due to COPD exacerbation, discharged on 3 L/min nocturnal oxygen (no prior oxygen requirement before that admission).  Also discharged on prednisone.  She reported progressively worsening lower extremity edema since d/c as well.  She presented in moderate acute respiratory distress, using accessory muscle for breathing, hardly speaking in full sentences, tachypneic.  Started on 3 L continuous oxygen with 98-100% saturation. Chest x-ray showed cardiomegaly, small to moderate right pleural effusion, interstitial edema.   She was admitted to medicine service for further evaluation and management of acute respiratory failure with hypoxia due to combination of COPD and decompensated CHF with edema.   Started on IV diuresis and IV steroids.  09/27/2022: Seen and examined at bedside.  Dyspneic on exam.  Conversational dyspnea.  Prior chest x-ray nonrevealing.  Will follow-up noncontrast CT chest.  Assessment and Plan: * Acute on chronic diastolic CHF (congestive heart failure) (HCC) Presented with has leg edema, elevated BNP 387, fine crackles on auscultation, chest x-ray showed interstitial edema, clinically consistent with CHF exacerbation.  2D echo on 07/01/2022 showed EF of 50-55% with grade 2  diastolic dysfunction.  BNP worsening greater than 4500 Repeating 2D echo 09/27/22 1 dose of IV Lasix given 40 mg x 1 Continue strict I's and O's and daily weight  Acute on chronic respiratory failure with hypoxia (HCC) Recently started on 3 L/min nocturnal O2 (at discharge earlier this month).  Requiring 3 L/min continuous at time of admission with moderate respiratory distress due to combination CHF decompensation and COPD exacerbation.  Patient recently had 2 negative VQ scan on 09/07/2022, and 07/01/2022 and is on Eliquis, very unlikely to have a PE. --Supplement O2, wean as tolerated --Target O2 sats 88-94% --Mgmt of underlying issues as outlined --Incentive spirometry Maintain O2 saturation greater than 90% Still very dyspneic, noncontrast CT scan chest ordered  Concern for community-acquired pneumonia, seen on CT scan Rocephin added Continue bronchodilators  Chronic obstructive pulmonary disease ([place]) With acute exacerbation - severely decreased air movement, unable to hear wheezing as result.  No infilatrate on imaging to suggest PNA and negative procal.     --Continue IV steroids, doxycycline --Bronchodilators --Mucinex --Pulmonary hygiene --Follow sputum culture  Chest pain Myocardial injury - demand ischemia with hypoxia most likely. Troponin only minimally elevated. Reported on admission, has resolved. Likely due to CHF and COPD and respiratory distress --Continue Aspirin, Lipitor --PRN nitro --Stat EKG and troponin if active chest pain occurs  Myocardial injury See chest pain  Paroxysmal atrial fibrillation (HCC) HR controlled.  At risk for developing RVR with respiratory issues. Monitor closely. -Continue amiodarone, Cardizem -Continue Eliquis -Telemetry  CKD (chronic kidney disease), stage IV (HCC) Creatinine downtrending. Continue to nephrotoxic agents  Hypertension BP stable Continue to monitor vital signs  HLD (hyperlipidemia) Lipitor  Tobacco  use  Nicotine patch. She reports recently stopped smoking.  Hypothyroidism Not currently on Synthroid. PCP follow up  Depression with anxiety Continue home medications        Physical Exam: Vitals:   09/27/22 0801 09/27/22 1342 09/27/22 1358 09/27/22 1615  BP: (!) 156/109 (!) 133/99  127/89  Pulse: 61 97  63  Resp:  18  18  Temp: 97.7 F (36.5 C) (!) 97.5 F (36.4 C)  98 F (36.7 C)  TempSrc: Oral Oral  Oral  SpO2: 98% 94% 94% 94%  Weight:      Height:       General exam: awake, alert, no acute distress HEENT: atraumatic, clear conjunctiva, anicteric sclera, moist mucus membranes, hearing grossly normal  Respiratory system: not currently wheezing, no rhonchi, normal respiratory effort at rest on 3 L/min [place] O2.  Poor air entry Cardiovascular system: normal S1/S2, RRR, no pedal edema.   Gastrointestinal system: soft, NT, ND Central nervous system: A&O x4. no gross focal neurologic deficits, normal speech Extremities: moves all, no edema, normal tone Skin: dry, intact, normal temperature Psychiatry: normal mood, congruent affect, judgement and insight appear normal   Data Reviewed:  Notable labs ---   glucose 153, BUN 53 from 44, Cr 2.64 from 2.49, WBC 13.2 from 14.7.  Hb g9.5 from 10.6  Family Communication: None  Disposition: Status is: Inpatient Remains inpatient appropriate because: Remains on IV therapies as above with persistent oxygen need above baseline.  Requires further improvement and close monitoring   Planned Discharge Destination: Home    Time spent: 36 minutes  Author: Kayleen Memos, DO 09/27/2022 6:40 PM  For on call review [email or web address].

## 2022-09-28 ENCOUNTER — Inpatient Hospital Stay
Admit: 2022-09-28 | Discharge: 2022-09-28 | Disposition: A | Discharge status: Home / Self Care | Payer: Medicare Other | Attending: Internal Medicine | Admitting: Internal Medicine

## 2022-09-28 DIAGNOSIS — I5031 Acute diastolic (congestive) heart failure: Secondary | ICD-10-CM

## 2022-09-28 LAB — CULTURE, BLOOD (ROUTINE X 2)
Culture: NO GROWTH
Culture: NO GROWTH
Special Requests: ADEQUATE

## 2022-09-28 LAB — CBC WITH DIFFERENTIAL/PLATELET
Abs Immature Granulocytes: 0.09 10*3/uL — ABNORMAL HIGH (ref 0.00–0.07)
Basophils Absolute: 0 10*3/uL (ref 0.0–0.1)
Basophils Relative: 0 %
Eosinophils Absolute: 0 10*3/uL (ref 0.0–0.5)
Eosinophils Relative: 0 %
HCT: 31.2 % — ABNORMAL LOW (ref 36.0–46.0)
Hemoglobin: 9.5 g/dL — ABNORMAL LOW (ref 12.0–15.0)
Immature Granulocytes: 1 %
Lymphocytes Relative: 5 %
Lymphs Abs: 0.6 10*3/uL — ABNORMAL LOW (ref 0.7–4.0)
MCH: 25.9 pg — ABNORMAL LOW (ref 26.0–34.0)
MCHC: 30.4 g/dL (ref 30.0–36.0)
MCV: 85 fL (ref 80.0–100.0)
Monocytes Absolute: 1 10*3/uL (ref 0.1–1.0)
Monocytes Relative: 9 %
Neutro Abs: 9.1 10*3/uL — ABNORMAL HIGH (ref 1.7–7.7)
Neutrophils Relative %: 85 %
Platelets: 261 10*3/uL (ref 150–400)
RBC: 3.67 MIL/uL — ABNORMAL LOW (ref 3.87–5.11)
RDW: 17.1 % — ABNORMAL HIGH (ref 11.5–15.5)
WBC: 10.7 10*3/uL — ABNORMAL HIGH (ref 4.0–10.5)
nRBC: 0 % (ref 0.0–0.2)

## 2022-09-28 LAB — ECHOCARDIOGRAM LIMITED
Calc EF: 39.6 %
Height: 65 in
S' Lateral: 4 cm
Single Plane A2C EF: 33.9 %
Single Plane A4C EF: 42.3 %
Weight: 1956.8 oz

## 2022-09-28 LAB — PHOSPHORUS: Phosphorus: 4.3 mg/dL (ref 2.5–4.6)

## 2022-09-28 LAB — BASIC METABOLIC PANEL
Anion gap: 8 (ref 5–15)
BUN: 92 mg/dL — ABNORMAL HIGH (ref 8–23)
CO2: 27 mmol/L (ref 22–32)
Calcium: 9.6 mg/dL (ref 8.9–10.3)
Chloride: 103 mmol/L (ref 98–111)
Creatinine, Ser: 2.77 mg/dL — ABNORMAL HIGH (ref 0.44–1.00)
GFR, Estimated: 18 mL/min — ABNORMAL LOW (ref >60)
Glucose, Bld: 111 mg/dL — ABNORMAL HIGH (ref 70–99)
Potassium: 5.1 mmol/L (ref 3.5–5.1)
Sodium: 138 mmol/L (ref 135–145)

## 2022-09-28 MED ORDER — FUROSEMIDE 10 MG/ML IJ SOLN
40.0000 mg | Freq: Every day | INTRAMUSCULAR | Status: DC
  Administered 2022-09-28 – 2022-09-29 (×2): 40 mg via INTRAVENOUS
  Filled 2022-09-28 (×2): qty 4

## 2022-09-28 NOTE — Progress Notes (Signed)
Echocardiogram 2D Echocardiogram has been performed.  Dawn Sherman [name] 09/28/2022, 12:03 PM

## 2022-09-28 NOTE — Progress Notes (Signed)
.note Progress Note   Patient: Dawn Sherman MVH:[id number] DOB: 10/31/1955 DOA: 09/23/2022     5 DOS: the patient was seen and examined on 09/28/2022   Brief hospital course:  Dawn Sherman is a 67 y.o. female with medical history significant of COPD on prn 3L of oxygen at night, hypertension, hyperlipidemia, GERD, depression, tobacco abuse, alcohol abuse in remission, CKD-IV,  dCHF (used to have sCHF with EF 30-35%, improved to EF 50-55 by 2D Echo on 07/01/22), SDH, PAF on Eliquis, second-degree AV block, presented on 09/23/2022 for evaluation of worsening SOB since the night before, with productive cough, wheezing, chest pain which had resolved by time of admission.  Patient was recently hospitalized from 1/13 - 1/18 due to COPD exacerbation, discharged on 3 L/min nocturnal oxygen (no prior oxygen requirement before that admission).  Also discharged on prednisone.  She reported progressively worsening lower extremity edema since d/c as well.  She presented in moderate acute respiratory distress, using accessory muscle for breathing, hardly speaking in full sentences, tachypneic.  Started on 3 L continuous oxygen with 98-100% saturation. Chest x-ray showed cardiomegaly, small to moderate right pleural effusion, interstitial edema.   She was admitted to medicine service for further evaluation and management of acute respiratory failure with hypoxia due to combination of COPD and decompensated CHF with edema.   Started on IV diuresis and IV steroids.  09/27/2022: Seen and examined at bedside.  Dyspneic on exam.  Conversational dyspnea.  Prior chest x-ray nonrevealing.  Will follow-up noncontrast CT chest.  09/28/2022: Patient seen and examined this morning.  No overnight events.  Patient continues to stay on 3 L nasal cannula.  Patient was evaluated with 2D echo which showed 35 to 40% EF decreased from her prior EF with global hypokinesia.  Patient input output is net positive.  Will maintain the patient on  Lasix 40 mg daily.  CT lung noncon  showed  Moderate right pleural effusion. Right middle lobe and lower lobe atelectasis. Patchy areas of airspace disease in the right upper lobe and right lower lobe concerning for pneumonia.  Patient is compromised lung parenchyma on the right will need thoracentesis.    Assessment and Plan:  * Acute on chronic diastolic CHF (congestive heart failure) (HCC) Presented with has leg edema, elevated BNP 387, fine crackles on auscultation, chest x-ray showed interstitial edema, clinically consistent with CHF exacerbation.  2D echo on 07/01/2022 showed EF of 50-55% with grade 2 diastolic dysfunction.  Repeat echo on 2/3 showed Left ventricular ejection fraction, by estimation, is 35 to 40%. The  left ventricle has moderately decreased function. The left ventricle  demonstrates global hypokinesis. The left ventricular internal cavity size  was moderately to severely dilated.  Along with a severely dilated left atrium  BNP worsening greater than 4500 1 dose of IV Lasix given 40 mg x 1 Continue strict I's and O's and daily weight-net positive today -Start the patient on Lasix 40 mg daily.  Acute on chronic respiratory failure with hypoxia (HCC) Recently started on 3 L/min nocturnal O2 (at discharge earlier this month).  Requiring 3 L/min continuous at time of admission with moderate respiratory distress due to combination CHF decompensation and COPD exacerbation.  Patient recently had 2 negative VQ scan on 09/07/2022, and 07/01/2022 and is on Eliquis, very unlikely to have a PE. --Supplement O2, wean as tolerated --Target O2 sats 88-94% --Mgmt of underlying issues as outlined --Incentive spirometry Maintain O2 saturation greater than 90% Still very dyspneic, noncontrast  CT scan chest ordered  Concern for community-acquired pneumonia, seen on CT scan Rocephin added Continue bronchodilators  Chronic obstructive pulmonary disease ([place]) With acute exacerbation -  severely decreased air movement, unable to hear wheezing as result.  No infilatrate on imaging to suggest PNA and negative procal.     --Continue IV steroids, doxycycline --Bronchodilators --Mucinex --Pulmonary hygiene --Follow sputum culture  Chest pain Myocardial injury - demand ischemia with hypoxia most likely. Troponin only minimally elevated. Reported on admission, has resolved. Likely due to CHF and COPD and respiratory distress --Continue Aspirin, Lipitor --PRN nitro --Stat EKG and troponin if active chest pain occurs  Myocardial injury See chest pain  Paroxysmal atrial fibrillation (HCC) HR controlled.  At risk for developing RVR with respiratory issues. Monitor closely. -Continue amiodarone, Cardizem -Continue Eliquis -Telemetry  CKD (chronic kidney disease), stage IV (HCC) Creatinine downtrending. Continue to nephrotoxic agents  Hypertension BP stable Continue to monitor vital signs  HLD (hyperlipidemia) Lipitor  Tobacco use Nicotine patch. She reports recently stopped smoking.  Hypothyroidism Not currently on Synthroid. PCP follow up  Depression with anxiety Continue home medications        Physical Exam: Vitals:   09/28/22 0802 09/28/22 1201 09/28/22 1202 09/28/22 1404  BP:  124/74 124/74   Pulse:   67   Resp:  20    Temp:      TempSrc:      SpO2: 96% 97% 99% 95%  Weight:      Height:       General exam: awake, alert, no acute distress HEENT: atraumatic, clear conjunctiva, anicteric sclera, moist mucus membranes, hearing grossly normal  Respiratory system: not currently wheezing, no rhonchi, normal respiratory effort at rest on 3 L/min  O2.  Poor air entry Cardiovascular system: normal S1/S2, RRR, no pedal edema.   Gastrointestinal system: soft, NT, ND Central nervous system: A&O x4. no gross focal neurologic deficits, normal speech Extremities: moves all, no edema, normal tone Skin: dry, intact, normal temperature Psychiatry:  normal mood, congruent affect, judgement and insight appear normal   Data Reviewed:  Notable labs ---   glucose 153, BUN 53 from 44, Cr 2.64 from 2.49, WBC 13.2 from 14.7.  Hb g9.5 from 10.6  Family Communication: None  Disposition: Status is: Inpatient Remains inpatient appropriate because: Remains on IV therapies as above with persistent oxygen need above baseline.  Requires further improvement and close monitoring   Planned Discharge Destination: Home    Time spent: 36 minutes  Author: Oran Rein, MD 09/28/2022 2:08 PM  For on call review [email or web address].

## 2022-09-29 LAB — CBC
HCT: 31.5 % — ABNORMAL LOW (ref 36.0–46.0)
Hemoglobin: 9.7 g/dL — ABNORMAL LOW (ref 12.0–15.0)
MCH: 26.4 pg (ref 26.0–34.0)
MCHC: 30.8 g/dL (ref 30.0–36.0)
MCV: 85.8 fL (ref 80.0–100.0)
Platelets: 247 10*3/uL (ref 150–400)
RBC: 3.67 MIL/uL — ABNORMAL LOW (ref 3.87–5.11)
RDW: 17.1 % — ABNORMAL HIGH (ref 11.5–15.5)
WBC: 9.8 10*3/uL (ref 4.0–10.5)
nRBC: 0 % (ref 0.0–0.2)

## 2022-09-29 LAB — BASIC METABOLIC PANEL
Anion gap: 9 (ref 5–15)
BUN: 98 mg/dL — ABNORMAL HIGH (ref 8–23)
CO2: 27 mmol/L (ref 22–32)
Calcium: 9.3 mg/dL (ref 8.9–10.3)
Chloride: 104 mmol/L (ref 98–111)
Creatinine, Ser: 2.66 mg/dL — ABNORMAL HIGH (ref 0.44–1.00)
GFR, Estimated: 19 mL/min — ABNORMAL LOW (ref >60)
Glucose, Bld: 145 mg/dL — ABNORMAL HIGH (ref 70–99)
Potassium: 5 mmol/L (ref 3.5–5.1)
Sodium: 140 mmol/L (ref 135–145)

## 2022-09-29 LAB — MAGNESIUM: Magnesium: 2 mg/dL (ref 1.7–2.4)

## 2022-09-29 NOTE — Progress Notes (Signed)
..note Progress Note   Patient: Dawn Sherman EXB:[id number] DOB: [birth year] DOA: 09/23/2022     6 DOS: the patient was seen and examined on 09/29/2022   Brief hospital course:  BETTIE CAPISTRAN is a 67 y.o. female with medical history significant of COPD on prn 3L of oxygen at night, hypertension, hyperlipidemia, GERD, depression, tobacco abuse, alcohol abuse in remission, CKD-IV,  dCHF (used to have sCHF with EF 30-35%, improved to EF 50-55 by 2D Echo on 07/01/22), SDH, PAF on Eliquis, second-degree AV block, presented on 09/23/2022 for evaluation of worsening SOB since the night before, with productive cough, wheezing, chest pain which had resolved by time of admission.  Patient was recently hospitalized from 1/13 - 1/18 due to COPD exacerbation, discharged on 3 L/min nocturnal oxygen (no prior oxygen requirement before that admission).  Also discharged on prednisone.  She reported progressively worsening lower extremity edema since d/c as well.  She presented in moderate acute respiratory distress, using accessory muscle for breathing, hardly speaking in full sentences, tachypneic.  Started on 3 L continuous oxygen with 98-100% saturation. Chest x-ray showed cardiomegaly, small to moderate right pleural effusion, interstitial edema.   She was admitted to medicine service for further evaluation and management of acute respiratory failure with hypoxia due to combination of COPD and decompensated CHF with edema.   Started on IV diuresis and IV steroids.  09/27/2022: Seen and examined at bedside.  Dyspneic on exam.  Conversational dyspnea.  Prior chest x-ray nonrevealing.  Will follow-up noncontrast CT chest.  09/28/2022: Patient seen and examined this morning.  No overnight events.  Patient continues to stay on 3 L nasal cannula.  Patient was evaluated with 2D echo which showed 35 to 40% EF decreased from her prior EF with global hypokinesia.  Patient input output is net positive.  Will maintain the patient on  Lasix 40 mg daily.  CT lung noncon  showed  Moderate right pleural effusion. Right middle lobe and lower lobe atelectasis. Patchy areas of airspace disease in the right upper lobe and right lower lobe concerning for pneumonia.  Patient is compromised lung parenchyma on the right will need thoracentesis.  2/4 : Patient seen and examined this morning.  No overnight events. Continues to stay on 3 L.  Thoracentesis orders in place.  Assessment and Plan:  * Acute on chronic diastolic CHF (congestive heart failure)  Presented with has leg edema, elevated BNP 387, fine crackles on auscultation, chest x-ray showed interstitial edema, clinically consistent with CHF exacerbation.  2D echo on 07/01/2022 showed EF of 50-55% with grade 2 diastolic dysfunction.  Repeat echo on 2/3 showed Left ventricular ejection fraction, by estimation, is 35 to 40%. The  left ventricle has moderately decreased function. The left ventricle  demonstrates global hypokinesis. The left ventricular internal cavity size  was moderately to severely dilated.  Along with a severely dilated left atrium  BNP worsening greater than 4500 Received 1 dose of IV Lasix given 40 mg x 1 Continue strict I's and O's and daily weight-net positive today -Start the patient on Lasix 40 mg daily.  Acute on chronic respiratory failure with hypoxia (HCC) Recently started on 3 L/min nocturnal O2 (at discharge earlier this month).  Requiring 3 L/min continuous at time of admission with moderate respiratory distress due to combination CHF decompensation and COPD exacerbation.  Patient recently had 2 negative VQ scan on 09/07/2022, and 07/01/2022 and is on Eliquis, very unlikely to have a PE. --Supplement O2, wean as  tolerated --Target O2 sats 88-94% --Mgmt of underlying issues as outlined --Incentive spirometry Maintain O2 saturation greater than 90% Still very dyspneic, noncontrast CT scan chest ordered  Concern for community-acquired pneumonia, seen on  CT scan Rocephin added Continue bronchodilators  Chronic obstructive pulmonary disease  With acute exacerbation - severely decreased air movement, unable to hear wheezing as result.  No infilatrate on imaging to suggest PNA and negative procal.      --Continue IV steroids, doxycycline --Bronchodilators --Mucinex --Pulmonary hygiene --Follow sputum culture  Chest pain - resolved  Myocardial injury - demand ischemia with hypoxia most likely. Troponin only minimally elevated. Reported on admission, has resolved. Likely due to CHF and COPD and respiratory distress --Continue Aspirin, Lipitor --PRN nitro --Stat EKG and troponin if active chest pain occurs  Myocardial injury See chest pain  Paroxysmal atrial fibrillation  HR controlled.  At risk for developing RVR with respiratory issues. Monitor closely. -Continue amiodarone, Cardizem -Continue Eliquis -Telemetry  CKD (chronic kidney disease), stage IV (HCC) Creatinine downtrending. Continue to nephrotoxic agents  Hypertension BP stable Continue to monitor vital signs  HLD (hyperlipidemia) Lipitor  Tobacco use Nicotine patch. She reports recently stopped smoking.  Hypothyroidism Not currently on Synthroid. PCP follow up  Depression with anxiety Continue home medications    Physical Exam: Vitals:   09/29/22 0403 09/29/22 0728 09/29/22 0819 09/29/22 1120  BP: 130/77  (!) 127/93 133/76  Pulse: 91  88 92  Resp: 18   18  Temp: 97.7 F (36.5 C)  97.8 F (36.6 C) 98.1 F (36.7 C)  TempSrc: Oral   Oral  SpO2: 90% 96% 96% 93%  Weight:      Height:       General exam: awake, alert, no acute distress HEENT: atraumatic, clear conjunctiva, anicteric sclera, moist mucus membranes, hearing grossly normal  Respiratory system: not currently wheezing, no rhonchi, normal respiratory effort at rest on 3 L/min [place] O2.  Poor air entry b/l  Cardiovascular system: normal S1/S2, RRR, no pedal edema.   Gastrointestinal system:  soft, NT, ND Central nervous system: A&O x4. no gross focal neurologic deficits, normal speech Extremities: moves all, no edema, normal tone Skin: dry, intact, normal temperature Psychiatry: normal mood, congruent affect, judgement and insight appear normal   Data Reviewed:  Notable labs ---   glucose 153, BUN 53 from 44, Cr 2.64 from 2.49, WBC 13.2 from 14.7.  Hb g9.5 from 10.6  Family Communication: None  Disposition: Status is: Inpatient Remains inpatient appropriate because: Remains on IV therapies as above with persistent oxygen need above baseline.  Requires further improvement and close monitoring   Planned Discharge Destination: Home    Time spent: 36 minutes  Author: Oran Rein, MD 09/29/2022 12:06 PM  For on call review [email or web address].

## 2022-09-30 ENCOUNTER — Inpatient Hospital Stay: Payer: Medicare Other

## 2022-09-30 DIAGNOSIS — J189 Pneumonia, unspecified organism: Secondary | ICD-10-CM

## 2022-09-30 DIAGNOSIS — R0602 Shortness of breath: Secondary | ICD-10-CM | POA: Diagnosis not present

## 2022-09-30 DIAGNOSIS — I5033 Acute on chronic diastolic (congestive) heart failure: Secondary | ICD-10-CM | POA: Diagnosis not present

## 2022-09-30 DIAGNOSIS — I509 Heart failure, unspecified: Secondary | ICD-10-CM | POA: Diagnosis not present

## 2022-09-30 LAB — BASIC METABOLIC PANEL
Anion gap: 13 (ref 5–15)
BUN: 92 mg/dL — ABNORMAL HIGH (ref 8–23)
CO2: 26 mmol/L (ref 22–32)
Calcium: 9.1 mg/dL (ref 8.9–10.3)
Chloride: 101 mmol/L (ref 98–111)
Creatinine, Ser: 2.77 mg/dL — ABNORMAL HIGH (ref 0.44–1.00)
GFR, Estimated: 18 mL/min — ABNORMAL LOW (ref >60)
Glucose, Bld: 123 mg/dL — ABNORMAL HIGH (ref 70–99)
Potassium: 4.7 mmol/L (ref 3.5–5.1)
Sodium: 140 mmol/L (ref 135–145)

## 2022-09-30 LAB — CBC
HCT: 31.7 % — ABNORMAL LOW (ref 36.0–46.0)
Hemoglobin: 9.7 g/dL — ABNORMAL LOW (ref 12.0–15.0)
MCH: 25.9 pg — ABNORMAL LOW (ref 26.0–34.0)
MCHC: 30.6 g/dL (ref 30.0–36.0)
MCV: 84.8 fL (ref 80.0–100.0)
Platelets: 254 10*3/uL (ref 150–400)
RBC: 3.74 MIL/uL — ABNORMAL LOW (ref 3.87–5.11)
RDW: 16.9 % — ABNORMAL HIGH (ref 11.5–15.5)
WBC: 10.3 10*3/uL (ref 4.0–10.5)
nRBC: 0 % (ref 0.0–0.2)

## 2022-09-30 MED ORDER — LIDOCAINE HCL (PF) 1 % IJ SOLN
10.0000 mL | Freq: Once | INTRAMUSCULAR | Status: AC
  Administered 2022-09-30: 10 mL via INTRADERMAL
  Filled 2022-09-30: qty 10

## 2022-09-30 MED ORDER — FUROSEMIDE 10 MG/ML IJ SOLN
40.0000 mg | Freq: Two times a day (BID) | INTRAMUSCULAR | Status: DC
  Administered 2022-09-30 – 2022-10-01 (×2): 40 mg via INTRAVENOUS
  Filled 2022-09-30 (×2): qty 4

## 2022-09-30 MED ORDER — IPRATROPIUM-ALBUTEROL 0.5-2.5 (3) MG/3ML IN SOLN
3.0000 mL | Freq: Two times a day (BID) | RESPIRATORY_TRACT | Status: DC
  Filled 2022-09-30: qty 3

## 2022-09-30 NOTE — Care Management Important Message (Signed)
Important Message  Patient Details  Name: Dawn Sherman MRN: [id number] Date of Birth: [birth year]   Medicare Important Message Given:  Yes  Patient out of room upon time of visit, no family available.  Confirmed copy of Medicare IM in room on counter for reference.    [name] 09/30/2022, 3:40 PM

## 2022-09-30 NOTE — Progress Notes (Signed)
..note Progress Note   Patient: Dawn Sherman ZOX:[id number] DOB: 1955-11-03 DOA: 09/23/2022     7 DOS: the patient was seen and examined on 09/30/2022   Brief hospital course: Dawn Sherman is a 67 y.o. female with medical history significant of COPD on prn 3L of oxygen at night, hypertension, hyperlipidemia, GERD, depression, tobacco abuse, alcohol abuse in remission, CKD-IV,  dCHF (used to have sCHF with EF 30-35%, improved to EF 50-55 by 2D Echo on 07/01/22), SDH, PAF on Eliquis, second-degree AV block, presented on 09/23/2022 for evaluation of worsening SOB since the night before, with productive cough, wheezing, chest pain which had resolved by time of admission.  Patient was recently hospitalized from 1/13 - 1/18 due to COPD exacerbation, discharged on 3 L/min nocturnal oxygen (no prior oxygen requirement before that admission).  Also discharged on prednisone.  She reported progressively worsening lower extremity edema since d/c as well.   She presented in moderate acute respiratory distress, using accessory muscle for breathing, hardly speaking in full sentences, tachypneic.  Started on 3 L continuous oxygen with 98-100% saturation. Chest x-ray showed cardiomegaly, small to moderate right pleural effusion, interstitial edema.    She was admitted to medicine service for further evaluation and management of acute respiratory failure with hypoxia due to combination of COPD and decompensated CHF with edema.   Started on IV diuresis and IV steroids.  2/2:  noncontrast CT chest showing Moderate right pleural effusion. Right middle lobe and lower lobe atelectasis. Patchy areas of airspace disease in the right upper lobe and right lower lobe concerning for pneumonia.  2/3: 2D echo which showed 35 to 40% EF decreased from her prior EF with global hypokinesia. Consult for thoracentesis placed.  2/4-2/5: patient remains stable on 3L. Appears euvolemic on exam. Thoracentesis pending.   Assessment and  Plan:  Acute on chronic diastolic CHF  Presented with has leg edema, elevated BNP 387, fine crackles on auscultation, chest x-ray showed interstitial edema, clinically consistent with CHF exacerbation.  2D echo on 07/01/2022 showed EF of 50-55% with grade 2 diastolic dysfunction.  Repeat echo on 2/3 showed Left ventricular ejection fraction, by estimation, is 35 to 40%. The  left ventricle has moderately decreased function. The left ventricle  demonstrates global hypokinesis. The left ventricular internal cavity size  was moderately to severely dilated.  Along with a severely dilated left atrium net positive is decreasing BNP worsening greater than 4500 Received 1 dose of IV Lasix given 40 mg x 1 - Continue strict I's and O's and daily weight. -IV Lasix 40 mg  Acute on chronic respiratory failure with hypoxia (HCC) Acute COPD exacerbation CAP Recently started on 3 L/min nocturnal O2 (at discharge earlier this month).  Requiring 3 L/min continuous at time of admission with moderate respiratory distress due to combination CHF decompensation and COPD exacerbation.  Patient recently had 2 negative VQ scan on 09/07/2022, and 07/01/2022 and is on Eliquis, very unlikely to have a PE. May have new baseline of chronic 3L. Thoracentesis 2/5.  --Supplement O2, wean as tolerated --Target O2 sats 88-94% --Mgmt of underlying issues as outlined --Incentive spirometry Maintain O2 saturation greater than 90% - CTX - Continue bronchodilators PRN  Chest pain - resolved  Myocardial injury - demand ischemia with hypoxia most likely. Troponin only minimally elevated. Reported on admission, has resolved. Likely due to CHF and COPD and respiratory distress --Continue Aspirin, Lipitor --PRN nitro --Stat EKG and troponin if active chest pain occurs  Myocardial injury See  chest pain  Paroxysmal atrial fibrillation  HR controlled.  At risk for developing RVR with respiratory issues. Monitor closely. -Continue  amiodarone, Cardizem -Continue Eliquis -Telemetry  CKD (chronic kidney disease), stage IV (HCC) Creatinine downtrending. Continue to nephrotoxic agents  Hypertension BP stable Continue to monitor vital signs  HLD (hyperlipidemia) Lipitor  Tobacco use Nicotine patch. She reports recently stopped smoking.  Hypothyroidism Not currently on Synthroid. PCP follow up  Depression with anxiety Continue home medications    Physical Exam: Vitals:   09/29/22 2028 09/30/22 0001 09/30/22 0249 09/30/22 0748  BP:  (!) 142/90 (!) 138/93   Pulse:  73 98   Resp:      Temp:  97.8 F (36.6 C) 97.8 F (36.6 C)   TempSrc:  Oral Oral   SpO2: 92% 90% 90% 93%  Weight:      Height:       General exam: awake, alert, no acute distress. frail HEENT: atraumatic, clear conjunctiva, anicteric sclera, moist mucus membranes, hearing grossly normal  Respiratory system: not currently wheezing, no rhonchi, normal respiratory effort at rest on 3 L/min [place] O2.  Poor air entry to lower right lobe.  Cardiovascular system: normal S1/S2, RRR, no pedal edema.   Gastrointestinal system: soft, NT, ND Central nervous system: A&O x4. no gross focal neurologic deficits, normal speech Extremities: moves all, no edema, low muscle tone Skin: dry, intact, normal temperature Psychiatry: normal mood, congruent affect, judgement and insight appear normal  Data Reviewed:    Latest Ref Rng & Units 09/30/2022    5:05 AM 09/29/2022    5:37 AM 09/28/2022    4:47 AM  BMP  Glucose 70 - 99 mg/dL 123  145  111   BUN 8 - 23 mg/dL 92  98  92   Creatinine 0.44 - 1.00 mg/dL 2.77  2.66  2.77   Sodium 135 - 145 mmol/L 140  140  138   Potassium 3.5 - 5.1 mmol/L 4.7  5.0  5.1   Chloride 98 - 111 mmol/L 101  104  103   CO2 22 - 32 mmol/L 26  27  27    Calcium 8.9 - 10.3 mg/dL 9.1  9.3  9.6   CBC    Component Value Date/Time   WBC 10.3 09/30/2022 0505   RBC 3.74 (L) 09/30/2022 0505   HGB 9.7 (L) 09/30/2022 0505   HCT 31.7 (L)  09/30/2022 0505   PLT 254 09/30/2022 0505   MCV 84.8 09/30/2022 0505   MCH 25.9 (L) 09/30/2022 0505   MCHC 30.6 09/30/2022 0505   RDW 16.9 (H) 09/30/2022 0505   LYMPHSABS 0.6 (L) 09/28/2022 0447   MONOABS 1.0 09/28/2022 0447   EOSABS 0.0 09/28/2022 0447   BASOSABS 0.0 09/28/2022 0447   Family Communication: husband at bedside  Disposition: Status is: Inpatient Remains inpatient appropriate because: Remains on IV therapies as above with persistent oxygen need above baseline.  Requires further improvement and close monitoring   Planned Discharge Destination: Home    Time spent: 36 minutes  Author: Richarda Osmond, MD 09/30/2022 8:08 AM  For on call review [email or web address].

## 2022-09-30 NOTE — Procedures (Signed)
PROCEDURE SUMMARY:  Successful US guided therapeutic right thoracentesis. Yielded 900 cc of clear, amber fluid. Pt tolerated procedure well. No immediate complications.  Specimen not sent for labs. CXR ordered.  EBL < 1 mL  Tyson Alias, AGNP 09/30/2022 3:16 PM

## 2022-10-01 ENCOUNTER — Encounter: Payer: Medicare Other | Admitting: Family

## 2022-10-01 ENCOUNTER — Telehealth: Payer: Self-pay | Admitting: Internal Medicine

## 2022-10-01 DIAGNOSIS — I5033 Acute on chronic diastolic (congestive) heart failure: Secondary | ICD-10-CM | POA: Diagnosis not present

## 2022-10-01 DIAGNOSIS — J189 Pneumonia, unspecified organism: Secondary | ICD-10-CM | POA: Diagnosis not present

## 2022-10-01 DIAGNOSIS — R0602 Shortness of breath: Secondary | ICD-10-CM | POA: Diagnosis not present

## 2022-10-01 MED ORDER — NICOTINE 21 MG/24HR TD PT24: 21.0000 mg | MEDICATED_PATCH | Freq: Every day | TRANSDERMAL | 0 refills | Status: DC

## 2022-10-01 NOTE — Telephone Encounter (Signed)
Letter is for her. Madaline Savage Duty is the end of Feb

## 2022-10-01 NOTE — TOC Transition Note (Addendum)
Transition of Care [facility]) - CM/SW Discharge Note   Patient Details  Name: Dawn Sherman MRN: [id number] Date of Birth: September 02, 1955  [facility] CM/SW Contact:  Laurena Slimmer, RN Phone Number: 10/01/2022, 1:17 PM   Clinical Narrative:    Spoke with patient regarding discharge home today. She stated she has oxygen at home for night time. Patient was advised she would need continuous oxygen. She receives her oxygen via [employer]. Patient has her home oxygen in the room for transport home.  Patient is agreeable to home health. She does not have a preference of [place] agencies. She was advised a a [place] agency that accepts her care would in direct contact with her for scheduling.    Referral sent to [facility] from [employer] for continuous oxygen .  Referral or [facility] sent and accepted by Floydene Flock from [facility].   TOC signing off.     Barriers to Discharge: Continued Medical Work up   Patient Goals and CMS Choice CMS Medicare.gov Compare Post Acute Care list provided to:: Patient Choice offered to / list presented to : Patient  Discharge Placement                         Discharge Plan and Services Additional resources added to the After Visit Summary for                                       Social Determinants of Health (SDOH) Interventions SDOH Screenings   Food Insecurity: No Food Insecurity (09/23/2022)  Housing: Low Risk  (09/23/2022)  Transportation Needs: No Transportation Needs (09/23/2022)  Utilities: Not At Risk (09/23/2022)  Depression (PHQ2-9): Low Risk  (08/14/2022)  Financial Resource Strain: Low Risk  (11/27/2021)  Physical Activity: Sufficiently Active (11/27/2021)  Social Connections: Unknown (11/27/2021)  Stress: No Stress Concern Present (11/27/2021)  Tobacco Use: Medium Risk (09/23/2022)     Readmission Risk Interventions    07/02/2022   12:26 PM 12/11/2021    3:17 PM  Readmission Risk Prevention Plan  Transportation Screening Complete  Complete  PCP or Specialist Appt within 3-5 Days  Complete  HRI or [place]  Complete  Social Work Consult for [place] Planning/Counseling  Complete  Palliative Care Screening  Not Applicable  Medication Review [employer]) Complete Complete  PCP or Specialist appointment within 3-5 days of discharge Complete   SW Recovery Care/Counseling Consult Complete   [facility] Not Applicable

## 2022-10-01 NOTE — Telephone Encounter (Signed)
Ok to write letter or should hospitalist complete?

## 2022-10-01 NOTE — Discharge Summary (Signed)
Physician Discharge Summary  Patient: Dawn Sherman MBW:[id number] DOB: 1956/08/12   Code Status: Full Code Admit date: 09/23/2022 Discharge date: 10/01/2022 Disposition: Home, No home health services recommended PCP: [name], MD  Recommendations for Outpatient Follow-up:  Follow up with PCP within 1-2 weeks Regarding general hospital follow up and preventative care Recommend monitoring for recurrent pleural effusion   Follow up with cardiology Regarding CHF  Discharge Diagnoses:  Principal Problem:   Acute on chronic diastolic CHF (congestive heart failure) ([place]) Active Problems:   Acute on chronic respiratory failure with hypoxia ([place])   Chronic obstructive pulmonary disease ([place])   Myocardial injury   Chest pain   Paroxysmal atrial fibrillation ([place])   CKD (chronic kidney disease), stage IV ([place])   Hypertension   HLD (hyperlipidemia)   Tobacco use   Hypothyroidism   Depression with anxiety   Pneumonia due to infectious organism   Pleural effusion due to CHF (congestive heart failure) [facility])  Brief Hospital Course Summary: Dawn Sherman is a 67 y.o. female with medical history significant of COPD on 3L of oxygen at night, hypertension, hyperlipidemia, GERD, depression, tobacco abuse, alcohol abuse in remission, CKD-IV,  dCHF (used to have sCHF with EF 30-35%, improved to EF 50-55 by 2D Echo on 07/01/22), SDH, PAF on Eliquis, second-degree AV block, presented on 09/23/2022 for evaluation of worsening SOB since the night before, with productive cough, wheezing, chest pain which had resolved by time of admission.  Patient was recently hospitalized from 1/13 - 1/18 due to COPD exacerbation, discharged on 3 L/min nocturnal oxygen (no prior oxygen requirement before that admission).  Also discharged on prednisone.  She reported progressively worsening lower extremity edema since d/c as well.   She presented in moderate acute respiratory distress, using accessory muscle for  breathing, hardly speaking in full sentences, tachypneic.  Started on 3 L continuous oxygen with 98-100% saturation. Chest x-ray showed cardiomegaly, small to moderate right pleural effusion, interstitial edema.    She was admitted to medicine service for further evaluation and management of acute respiratory failure with hypoxia due to combination of COPD and decompensated CHF with edema.   Started on IV diuresis and IV steroids.   2/2:  noncontrast CT chest showing Moderate right pleural effusion. Right middle lobe and lower lobe atelectasis. Patchy areas of airspace disease in the right upper lobe and right lower lobe concerning for pneumonia.   2/3: 2D echo which showed 35 to 40% EF decreased from her prior EF with global hypokinesia. Consult for thoracentesis placed. Completed 5 day course of doxycycline.    2/4: patient remains stable on 3L. Appears euvolemic on exam.  2/5: thoracentesis removed approximately 900cc fluid. Continues on 3L [place]. Stable for dc. Oxygen prescribed as continuous. PT/OT evaluated and recommended home health.   Discharge Condition: Stable, improved Recommended discharge diet: Regular healthy diet  Consultations: IR  Procedures/Studies: Thoracentesis   Allergies as of 10/01/2022       Reactions   Sulfate Rash   Codeine Sulfate Nausea Only   Benicar [olmesartan]    Talked with patient February 10, 2020, intolerance is unclear, tried several medications around that time and one of them gave her a rash but she is not clear which 1.   Amoxicillin Rash   Clindamycin/lincomycin Rash   Entresto [sacubitril-valsartan] Other (See Comments)   hyperkalemia   Morphine And Related Rash   Penicillins Rash        Medication List     STOP  taking these medications    ferrous sulfate 325 (65 FE) MG tablet   hydrALAZINE 25 MG tablet Commonly known as: APRESOLINE   levothyroxine 50 MCG tablet Commonly known as: SYNTHROID       TAKE these medications     acetaminophen 650 MG CR tablet Commonly known as: TYLENOL Take 650 mg by mouth every 8 (eight) hours as needed for pain or fever.   albuterol 108 (90 Base) MCG/ACT inhaler Commonly known as: VENTOLIN HFA Inhale 2 puffs into the lungs every 6 (six) hours as needed for wheezing or shortness of breath.   amiodarone 200 MG tablet Commonly known as: PACERONE Take 1 tablet (200 mg total) by mouth daily.   apixaban 2.5 MG Tabs tablet Commonly known as: ELIQUIS Take 1 tablet (2.5 mg total) by mouth 2 (two) times daily.   atorvastatin 40 MG tablet Commonly known as: LIPITOR TAKE ONE TABLET BY MOUTH EVERY DAY   buPROPion 150 MG 24 hr tablet Commonly known as: WELLBUTRIN XL TAKE ONE TABLET BY MOUTH EVERY DAY   calcium carbonate 1250 (500 Ca) MG tablet Commonly known as: OS-CAL - dosed in mg of elemental calcium Take 1 tablet (500 mg of elemental calcium total) by mouth 3 (three) times daily with meals.   diltiazem 120 MG 24 hr capsule Commonly known as: CARDIZEM CD Take 1 capsule (120 mg total) by mouth daily.   Farxiga 10 MG Tabs tablet Generic drug: dapagliflozin propanediol Take 10 mg by mouth daily.   furosemide 40 MG tablet Commonly known as: LASIX Take 1 tablet (40 mg total) by mouth daily. Take 40 mg daily and and extra 40 mg as needed for fluid retention   loratadine 10 MG tablet Commonly known as: CLARITIN Take 10 mg by mouth daily.   nicotine 21 mg/24hr patch Commonly known as: NICODERM CQ - dosed in mg/24 hours Place 1 patch (21 mg total) onto the skin daily.   omeprazole 20 MG capsule Commonly known as: PRILOSEC Take 20 mg by mouth daily.   Trelegy Ellipta 100-62.5-25 MCG/ACT Aepb Generic drug: Fluticasone-Umeclidin-Vilant Inhale 1 puff into the lungs daily.   venlafaxine XR 150 MG 24 hr capsule Commonly known as: EFFEXOR-XR TAKE ONE CAPSULE BY MOUTH EVERY DAY       Subjective   Pt reports feeling well. She denies shortness of breath. She  understands that she will need oxygen full time at this time.   All questions and concerns were addressed at time of discharge.  Objective  Blood pressure 133/70, pulse 82, temperature 97.8 F (36.6 C), temperature source Oral, resp. rate 18, height 5\' 5"  (1.651 m), weight 54 kg, SpO2 98 %.   General: Pt is alert, awake, not in acute distress Cardiovascular: RRR, S1/S2 +, no rubs, no gallops Respiratory: CTA bilaterally, no wheezing, no rhonchi Abdominal: Soft, NT, ND, bowel sounds + Extremities: no edema, no cyanosis  The results of significant diagnostics from this hospitalization (including imaging, microbiology, ancillary and laboratory) are listed below for reference.   Imaging studies: US THORACENTESIS ASP PLEURAL SPACE W/IMG GUIDE  Result Date: 09/30/2022 INDICATION: Patient with history significant for COPD on 3 L O2 p.r.n. at night, tobacco abuse and CHF complaining of increased shortness of breath with productive cough wheezing and chest pain. CT chest revealed moderate right pleural effusion. Request received for therapeutic right thoracentesis EXAM: ULTRASOUND GUIDED THERAPEUTIC RIGHT THORACENTESIS MEDICATIONS: 10 mL 1 % lidocaine COMPLICATIONS: None immediate. PROCEDURE: An ultrasound guided thoracentesis was thoroughly discussed with the patient  and questions answered. The benefits, risks, alternatives and complications were also discussed. The patient understands and wishes to proceed with the procedure. Written consent was obtained. Ultrasound was performed to localize and mark an adequate pocket of fluid in the right chest. The area was then prepped and draped in the normal sterile fashion. 1% Lidocaine was used for local anesthesia. Under ultrasound guidance a 6 Fr Safe-T-Centesis catheter was introduced. Thoracentesis was performed. The catheter was removed and a dressing applied. FINDINGS: A total of approximately 900 cc of clear, amber fluid was removed. IMPRESSION: Successful  ultrasound guided right thoracentesis yielding 900 cc of pleural fluid. Read by: Narda Rutherford, AGNP-BC Electronically Signed   By: Corrie Mckusick D.O.   On: 09/30/2022 15:39   DG Chest Port 1 View  Result Date: 09/30/2022 CLINICAL DATA:  Shortness of breath.  Status post thoracentesis. EXAM: PORTABLE CHEST 1 VIEW COMPARISON:  Same day. FINDINGS: No pneumothorax is noted status post right thoracentesis. Minimal right pleural effusion is noted which is decreased compared to prior exam. IMPRESSION: No pneumothorax status post right thoracentesis. Electronically Signed   By: Marijo Conception M.D.   On: 09/30/2022 15:20   DG Chest Port 1 View  Result Date: 09/30/2022 CLINICAL DATA:  Congestive heart failure, shortness of breath. EXAM: PORTABLE CHEST 1 VIEW COMPARISON:  09/23/2022 and CT chest 09/27/2022. FINDINGS: Trachea is midline. Heart is enlarged. Interstitial prominence and indistinctness, as before. Moderate right pleural effusion. Right perihilar subsegmental atelectasis. Posttraumatic deformity of the left chest wall. IMPRESSION: Congestive heart failure with right perihilar subsegmental atelectasis. Electronically Signed   By: Lorin Picket M.D.   On: 09/30/2022 11:20   ECHOCARDIOGRAM LIMITED  Result Date: 09/28/2022    ECHOCARDIOGRAM LIMITED REPORT   Patient Name:   CORIANNE BUCCELLATO Date of Exam: 09/28/2022 Medical Rec #:  [id number]     Height:       65.0 in Accession #:    [id number]    Weight:       122.3 lb Date of Birth:  1956/06/17    BSA:          1.605 m Patient Age:    67 years      BP:           127/79 mmHg Patient Gender: F             HR:           96 bpm. Exam Location:  [facility] Procedure: Limited Echo Indications:     CHF- Acute Diastolic 761.95  History:         Patient has prior history of Echocardiogram examinations.  Sonographer:     Wayland Salinas RDCS Referring Phys:  [id number] [place] Diagnosing Phys: [place]  1. Left ventricular ejection fraction, by estimation, is 35 to  40%. The left ventricle has moderately decreased function. The left ventricle demonstrates global hypokinesis. The left ventricular internal cavity size was moderately to severely dilated.  2. Right ventricular systolic function is moderately reduced. The right ventricular size is moderately enlarged. Moderately increased right ventricular wall thickness.  3. Left atrial size was severely dilated.  4. Right atrial size was severely dilated.  5. The mitral valve is normal in structure. Moderate mitral valve regurgitation. No evidence of mitral stenosis.  6. The aortic valve is normal in structure. Aortic valve regurgitation is not visualized. No aortic stenosis is present.  7. The inferior vena cava is normal in size with greater than  50% respiratory variability, suggesting right atrial pressure of 3 mmHg. FINDINGS  Left Ventricle: Left ventricular ejection fraction, by estimation, is 35 to 40%. The left ventricle has moderately decreased function. The left ventricle demonstrates global hypokinesis. The left ventricular internal cavity size was moderately to severely dilated. There is no left ventricular hypertrophy. Right Ventricle: The right ventricular size is moderately enlarged. Moderately increased right ventricular wall thickness. Right ventricular systolic function is moderately reduced. Left Atrium: Left atrial size was severely dilated. Right Atrium: Right atrial size was severely dilated. Pericardium: There is no evidence of pericardial effusion. Mitral Valve: The mitral valve is normal in structure. Moderate mitral valve regurgitation. No evidence of mitral valve stenosis. Tricuspid Valve: The tricuspid valve is not assessed. Tricuspid valve regurgitation is not demonstrated. No evidence of tricuspid stenosis. Aortic Valve: The aortic valve is normal in structure. Aortic valve regurgitation is not visualized. No aortic stenosis is present. Pulmonic Valve: The pulmonic valve was not assessed. Pulmonic  valve regurgitation is not visualized. No evidence of pulmonic stenosis. Aorta: The aortic root is normal in size and structure. Venous: The inferior vena cava is normal in size with greater than 50% respiratory variability, suggesting right atrial pressure of 3 mmHg. IAS/Shunts: No atrial level shunt detected by color flow Doppler. LEFT VENTRICLE PLAX 2D LVIDd:         5.10 cm      Diastology LVIDs:         4.00 cm      LV e' medial:  4.68 cm/s LV PW:         1.20 cm      LV e' lateral: 6.64 cm/s LV IVS:        1.20 cm  LV Volumes (MOD) LV vol d, MOD A2C: 109.0 ml LV vol d, MOD A4C: 160.0 ml LV vol s, MOD A2C: 72.0 ml LV vol s, MOD A4C: 92.4 ml LV SV MOD A2C:     37.0 ml LV SV MOD A4C:     160.0 ml LV SV MOD BP:      54.1 ml Neoma Laming Electronically signed by Neoma Laming Signature Date/Time: 09/28/2022/12:35:51 PM    Final    CT CHEST WO CONTRAST  Result Date: 09/27/2022 CLINICAL DATA:  COPD, hypertension, respiratory illness EXAM: CT CHEST WITHOUT CONTRAST TECHNIQUE: Multidetector CT imaging of the chest was performed following the standard protocol without IV contrast. RADIATION DOSE REDUCTION: This exam was performed according to the departmental dose-optimization program which includes automated exposure control, adjustment of the mA and/or kV according to patient size and/or use of iterative reconstruction technique. COMPARISON:  02/10/2020 FINDINGS: Cardiovascular: No significant vascular findings. Cardiomegaly. Thoracic aortic atherosclerosis. No pericardial effusion. Mediastinum/Nodes: No enlarged mediastinal or axillary lymph nodes. Small globular area measuring 10 mm along the right posterolateral aspect of the trachea which may reflect mucous versus a tracheal mass secondary to infectious, inflammatory or neoplastic etiology. Thyroid gland and esophagus demonstrate no significant findings. Lungs/Pleura: Moderate right pleural effusion. Right middle lobe and lower lobe atelectasis. Patchy areas of  airspace disease in the right upper lobe and right lower lobe concerning for pneumonia. Bilateral centrilobular emphysema. No pneumothorax. Upper Abdomen: No acute upper abdominal abnormality. Severe right renal atrophy. Musculoskeletal: No acute osseous abnormality. Dextroscoliosis of the thoracolumbar spine. IMPRESSION: 1. Moderate right pleural effusion. Right middle lobe and lower lobe atelectasis. Patchy areas of airspace disease in the right upper lobe and right lower lobe concerning for pneumonia. 2. Small globular area measuring 10  mm along the right posterolateral aspect of the trachea which may reflect mucous versus a tracheal mass secondary to infectious, inflammatory or neoplastic etiology. Recommend further evaluation with direct visualization. 3. Aortic Atherosclerosis (ICD10-I70.0) and Emphysema (ICD10-J43.9). Electronically Signed   By: Kathreen Devoid M.D.   On: 09/27/2022 10:38   DG Chest Portable 1 View  Result Date: 09/23/2022 CLINICAL DATA:  67 year old female with shortness of breath and chest pain for 6 hours. EXAM: PORTABLE CHEST 1 VIEW COMPARISON:  Chest radiographs 09/07/2022 and earlier. FINDINGS: Portable AP upright view at 0553 hours. Ongoing right pleural effusion, small to moderate and not significantly changed from earlier this month. Evidence of underlying cardiomegaly. Underlying moderate dextroconvex thoracic scoliosis. Increased pulmonary vascularity is stable. No pneumothorax, left pleural effusion, air bronchograms. No acute osseous abnormality identified. Decreased visible bowel gas in the upper abdomen. IMPRESSION: 1. Suspected pulmonary interstitial edema and ongoing right pleural effusion, small to moderate. Not significantly changed from earlier this month. 2. Underlying cardiomegaly, scoliosis. 3. No new cardiopulmonary abnormality. Electronically Signed   By: Genevie Ann M.D.   On: 09/23/2022 06:06   NM Pulmonary Perfusion  Result Date: 09/07/2022 CLINICAL DATA:  back  pain, sob, hypoxia EXAM: NUCLEAR MEDICINE PERFUSION LUNG SCAN TECHNIQUE: Perfusion images were obtained in multiple projections after intravenous injection of radiopharmaceutical. Ventilation scans intentionally deferred if perfusion scan and chest x-ray adequate for interpretation during COVID 19 epidemic. RADIOPHARMACEUTICALS:  4.3 mCi Tc-29m MAA IV COMPARISON:  July 01, 2022 FINDINGS: Homogeneous distribution of radiotracer in bilateral lungs. No suspicious perfusion defect is identified. Cardiomegaly. Small RIGHT pleural effusion. IMPRESSION: Very low probability for pulmonary embolism by modified perfusion by modified perfusion only PIOPED criteria (PE absent). Electronically Signed   By: Valentino Saxon M.D.   On: 09/07/2022 16:05   DG Chest 2 View  Result Date: 09/07/2022 CLINICAL DATA:  Worsening shortness of breath for several days. EXAM: CHEST - 2 VIEW COMPARISON:  06/30/2022 FINDINGS: Stable cardiomegaly. New small to moderate right pleural effusion is seen with right basilar atelectasis. Left lung is clear. Multiple old left rib fracture deformities and thoracic dextroscoliosis remains stable. IMPRESSION: New small to moderate right pleural effusion and right basilar atelectasis. Stable cardiomegaly. Electronically Signed   By: Marlaine Hind M.D.   On: 09/07/2022 13:23    Labs: Basic Metabolic Panel: Recent Labs  Lab 09/26/22 0609 09/27/22 1026 09/28/22 0447 09/29/22 0537 09/30/22 0505  NA 138 136 138 140 140  K 4.6 5.4* 5.1 5.0 4.7  CL 104 103 103 104 101  CO2 27 23 27 27 26   GLUCOSE 147* 145* 111* 145* 123*  BUN 64* 84* 92* 98* 92*  CREATININE 2.72* 2.67* 2.77* 2.66* 2.77*  CALCIUM 9.1 9.4 9.6 9.3 9.1  MG  --   --   --  2.0  --   PHOS  --  4.8* 4.3  --   --    CBC: Recent Labs  Lab 09/25/22 0238 09/28/22 0447 09/29/22 0537 09/30/22 0505  WBC 13.2* 10.7* 9.8 10.3  NEUTROABS  --  9.1*  --   --   HGB 9.5* 9.5* 9.7* 9.7*  HCT 30.3* 31.2* 31.5* 31.7*  MCV 83.9 85.0  85.8 84.8  PLT 260 261 247 254   Microbiology: Results for orders placed or performed during the hospital encounter of 09/23/22  Resp panel by RT-PCR (RSV, Flu A&B, Covid) Anterior Nasal Swab     Status: None   Collection Time: 09/23/22  5:56 AM   Specimen:  Anterior Nasal Swab  Result Value Ref Range Status   SARS Coronavirus 2 by RT PCR NEGATIVE NEGATIVE Final    Comment: (NOTE) SARS-CoV-2 target nucleic acids are NOT DETECTED.  The SARS-CoV-2 RNA is generally detectable in upper respiratory specimens during the acute phase of infection. The lowest concentration of SARS-CoV-2 viral copies this assay can detect is 138 copies/mL. A negative result does not preclude SARS-Cov-2 infection and should not be used as the sole basis for treatment or other patient management decisions. A negative result may occur with  improper specimen collection/handling, submission of specimen other than nasopharyngeal swab, presence of viral mutation(s) within the areas targeted by this assay, and inadequate number of viral copies(<138 copies/mL). A negative result must be combined with clinical observations, patient history, and epidemiological information. The expected result is Negative.  Fact Sheet for Patients:  EntrepreneurPulse.com.au  Fact Sheet for Healthcare Providers:  IncredibleEmployment.be  This test is no t yet approved or cleared by the [place] FDA and  has been authorized for detection and/or diagnosis of SARS-CoV-2 by FDA under an Emergency Use Authorization (EUA). This EUA will remain  in effect (meaning this test can be used) for the duration of the COVID-19 declaration under Section 564(b)(1) of the Act, 21 U.S.C.section 360bbb-3(b)(1), unless the authorization is terminated  or revoked sooner.       Influenza A by PCR NEGATIVE NEGATIVE Final   Influenza B by PCR NEGATIVE NEGATIVE Final    Comment: (NOTE) The Xpert Xpress  SARS-CoV-2/FLU/RSV plus assay is intended as an aid in the diagnosis of influenza from Nasopharyngeal swab specimens and should not be used as a sole basis for treatment. Nasal washings and aspirates are unacceptable for Xpert Xpress SARS-CoV-2/FLU/RSV testing.  Fact Sheet for Patients: EntrepreneurPulse.com.au  Fact Sheet for Healthcare Providers: IncredibleEmployment.be  This test is not yet approved or cleared by the [place] FDA and has been authorized for detection and/or diagnosis of SARS-CoV-2 by FDA under an Emergency Use Authorization (EUA). This EUA will remain in effect (meaning this test can be used) for the duration of the COVID-19 declaration under Section 564(b)(1) of the Act, 21 U.S.C. section 360bbb-3(b)(1), unless the authorization is terminated or revoked.     Resp Syncytial Virus by PCR NEGATIVE NEGATIVE Final    Comment: (NOTE) Fact Sheet for Patients: EntrepreneurPulse.com.au  Fact Sheet for Healthcare Providers: IncredibleEmployment.be  This test is not yet approved or cleared by the [place] FDA and has been authorized for detection and/or diagnosis of SARS-CoV-2 by FDA under an Emergency Use Authorization (EUA). This EUA will remain in effect (meaning this test can be used) for the duration of the COVID-19 declaration under Section 564(b)(1) of the Act, 21 U.S.C. section 360bbb-3(b)(1), unless the authorization is terminated or revoked.  Performed at [facility], [address] [place], [place] [address]   Blood culture (routine x 2)     Status: None   Collection Time: 09/23/22  7:57 AM   Specimen: BLOOD  Result Value Ref Range Status   Specimen Description BLOOD LEFT ANTECUBITAL  Final   Special Requests   Final    BOTTLES DRAWN AEROBIC AND ANAEROBIC Blood Culture adequate volume   Culture   Final    NO GROWTH 5 DAYS Performed at [facility],  [address], [place], [place] [address]    Report Status 09/28/2022 FINAL  Final  Blood culture (routine x 2)     Status: None   Collection Time: 09/23/22  7:73 AM   Specimen: BLOOD  Result Value Ref Range Status   Specimen Description BLOOD BLOOD RIGHT HAND  Final   Special Requests   Final    BOTTLES DRAWN AEROBIC AND ANAEROBIC Blood Culture results may not be optimal due to an inadequate volume of blood received in culture bottles   Culture   Final    NO GROWTH 5 DAYS Performed at [facility], [address], [place], [place] [address]    Report Status 09/28/2022 FINAL  Final   Time coordinating discharge: Over 1 minutes  Richarda Osmond, MD  [facility] 10/01/2022, 10:27 AM

## 2022-10-01 NOTE — Consult Note (Signed)
   [facility] CM Inpatient Consult   10/01/2022  Dawn Sherman [birth year] [id number]  [place] Organization [ACO] Patient: Medicare [place] [facility] Liaison remote coverage review for patient admitted to [facility]    Primary Care Provider:  [name], MD, with [place] at [facility] which is listed to provide the transition of care follow up  Patient screened for less than 30 days readmission hospitalization with noted high risk score for unplanned readmission risk, length of stay, and to assess for potential [place] Management service needs for post hospital transition for care coordination.  Review of patient's electronic medical record reveals patient had recent out reach by [facility] RNCM.  Current review of post hospital needs is recommended for home with [facility] for transition home.  1:40 pm Spoke with the patient via phone, HIPAA verified, explained nature of follow up call. Patient states she is ready to go home and [facility] is scheduled to call her, she denies any other needs.  She states her husband has her transport oxygen in the car.  Explained to patient she may receive post hospital follow up calls and she states that would be fine, however she had no other needs at this time.  Plan:  Patient denies any other needs and encouraged to let the nurses know if she has concerns and she knows who to contact.  Of note, [facility] Care Management/Population Health does not replace or interfere with any arrangements made by the Inpatient Transition of Care team.  For questions contact:   [name], RN BSN [place]  [phone] business mobile phone Toll free office [phone]  *[place]  [phone] Fax number: [phone] [email or web address]

## 2022-10-01 NOTE — Progress Notes (Incomplete)
..note Progress Note   Patient: Dawn Sherman VWP:[id number] DOB: Jun 21, 1956 DOA: 09/23/2022     8 DOS: the patient was seen and examined on 10/01/2022   Brief hospital course: Dawn Sherman is a 67 y.o. female with medical history significant of COPD on prn 3L of oxygen at night, hypertension, hyperlipidemia, GERD, depression, tobacco abuse, alcohol abuse in remission, CKD-IV,  dCHF (used to have sCHF with EF 30-35%, improved to EF 50-55 by 2D Echo on 07/01/22), SDH, PAF on Eliquis, second-degree AV block, presented on 09/23/2022 for evaluation of worsening SOB since the night before, with productive cough, wheezing, chest pain which had resolved by time of admission.  Patient was recently hospitalized from 1/13 - 1/18 due to COPD exacerbation, discharged on 3 L/min nocturnal oxygen (no prior oxygen requirement before that admission).  Also discharged on prednisone.  She reported progressively worsening lower extremity edema since d/c as well.   She presented in moderate acute respiratory distress, using accessory muscle for breathing, hardly speaking in full sentences, tachypneic.  Started on 3 L continuous oxygen with 98-100% saturation. Chest x-ray showed cardiomegaly, small to moderate right pleural effusion, interstitial edema.    She was admitted to medicine service for further evaluation and management of acute respiratory failure with hypoxia due to combination of COPD and decompensated CHF with edema.   Started on IV diuresis and IV steroids.  2/2:  noncontrast CT chest showing Moderate right pleural effusion. Right middle lobe and lower lobe atelectasis. Patchy areas of airspace disease in the right upper lobe and right lower lobe concerning for pneumonia.  2/3: 2D echo which showed 35 to 40% EF decreased from her prior EF with global hypokinesia. Consult for thoracentesis placed.  2/4-2/5: patient remains stable on 3L. Appears euvolemic on exam. Thoracentesis pending.   Assessment and  Plan:  Acute on chronic diastolic CHF  Presented with has leg edema, elevated BNP 387, fine crackles on auscultation, chest x-ray showed interstitial edema, clinically consistent with CHF exacerbation.  2D echo on 07/01/2022 showed EF of 50-55% with grade 2 diastolic dysfunction.  Repeat echo on 2/3 showed Left ventricular ejection fraction, by estimation, is 35 to 40%. The  left ventricle has moderately decreased function. The left ventricle  demonstrates global hypokinesis. The left ventricular internal cavity size  was moderately to severely dilated.  Along with a severely dilated left atrium net positive is decreasing BNP worsening greater than 4500 Received 1 dose of IV Lasix given 40 mg x 1 - Continue strict I's and O's and daily weight. -IV Lasix 40 mg  Acute on chronic respiratory failure with hypoxia (HCC) Acute COPD exacerbation CAP Recently started on 3 L/min nocturnal O2 (at discharge earlier this month).  Requiring 3 L/min continuous at time of admission with moderate respiratory distress due to combination CHF decompensation and COPD exacerbation.  Patient recently had 2 negative VQ scan on 09/07/2022, and 07/01/2022 and is on Eliquis, very unlikely to have a PE. May have new baseline of chronic 3L. Thoracentesis 2/5.  --Supplement O2, wean as tolerated --Target O2 sats 88-94% --Mgmt of underlying issues as outlined --Incentive spirometry Maintain O2 saturation greater than 90% - CTX - Continue bronchodilators PRN  Chest pain - resolved  Myocardial injury - demand ischemia with hypoxia most likely. Troponin only minimally elevated. Reported on admission, has resolved. Likely due to CHF and COPD and respiratory distress --Continue Aspirin, Lipitor --PRN nitro --Stat EKG and troponin if active chest pain occurs  Myocardial injury See  chest pain  Paroxysmal atrial fibrillation  HR controlled.  At risk for developing RVR with respiratory issues. Monitor closely. -Continue  amiodarone, Cardizem -Continue Eliquis -Telemetry  CKD (chronic kidney disease), stage IV (HCC) Creatinine downtrending. Continue to nephrotoxic agents  Hypertension BP stable Continue to monitor vital signs  HLD (hyperlipidemia) Lipitor  Tobacco use Nicotine patch. She reports recently stopped smoking.  Hypothyroidism Not currently on Synthroid. PCP follow up  Depression with anxiety Continue home medications    Physical Exam: Vitals:   09/30/22 1653 09/30/22 2040 09/30/22 2256 10/01/22 0524  BP: 139/79 (!) 147/85 (!) 143/80 133/70  Pulse: 84 88 89 82  Resp: 20  18 18   Temp:  98.1 F (36.7 C) 98 F (36.7 C) 97.8 F (36.6 C)  TempSrc:  Oral Oral Oral  SpO2: 97% 99% 96% 98%  Weight:      Height:       General exam: awake, alert, no acute distress. frail HEENT: atraumatic, clear conjunctiva, anicteric sclera, moist mucus membranes, hearing grossly normal  Respiratory system: not currently wheezing, no rhonchi, normal respiratory effort at rest on 3 L/min [place] O2.  Poor air entry to lower right lobe.  Cardiovascular system: normal S1/S2, RRR, no pedal edema.   Gastrointestinal system: soft, NT, ND Central nervous system: A&O x4. no gross focal neurologic deficits, normal speech Extremities: moves all, no edema, low muscle tone Skin: dry, intact, normal temperature Psychiatry: normal mood, congruent affect, judgement and insight appear normal  Data Reviewed:    Latest Ref Rng & Units 09/30/2022    5:05 AM 09/29/2022    5:37 AM 09/28/2022    4:47 AM  BMP  Glucose 70 - 99 mg/dL 123  145  111   BUN 8 - 23 mg/dL 92  98  92   Creatinine 0.44 - 1.00 mg/dL 2.77  2.66  2.77   Sodium 135 - 145 mmol/L 140  140  138   Potassium 3.5 - 5.1 mmol/L 4.7  5.0  5.1   Chloride 98 - 111 mmol/L 101  104  103   CO2 22 - 32 mmol/L 26  27  27    Calcium 8.9 - 10.3 mg/dL 9.1  9.3  9.6   CBC    Component Value Date/Time   WBC 10.3 09/30/2022 0505   RBC 3.74 (L) 09/30/2022 0505   HGB  9.7 (L) 09/30/2022 0505   HCT 31.7 (L) 09/30/2022 0505   PLT 254 09/30/2022 0505   MCV 84.8 09/30/2022 0505   MCH 25.9 (L) 09/30/2022 0505   MCHC 30.6 09/30/2022 0505   RDW 16.9 (H) 09/30/2022 0505   LYMPHSABS 0.6 (L) 09/28/2022 0447   MONOABS 1.0 09/28/2022 0447   EOSABS 0.0 09/28/2022 0447   BASOSABS 0.0 09/28/2022 0447   Family Communication: husband at bedside  Disposition: Status is: Inpatient Remains inpatient appropriate because: Remains on IV therapies as above with persistent oxygen need above baseline.  Requires further improvement and close monitoring   Planned Discharge Destination: Home {Tip this will not be part of the note when signed  DVT Prophylaxis  .Apixaban (eliquis) tablet 2.5 mg , Apixaban (eliquis) tablet 2.5 mg  (Optional):26781}   Time spent: 36 minutes  Author: Richarda Osmond, MD 10/01/2022 7:51 AM  For on call review [email or web address].

## 2022-10-01 NOTE — Telephone Encounter (Signed)
Pt husband called stating pt is currently in the hospital and request for the provider to write an excuse for jury duty

## 2022-10-01 NOTE — Telephone Encounter (Signed)
Is letter for jury duty for Ms Nadeau?  If so, ok to provide letter.  I do not see the husband.

## 2022-10-01 NOTE — Evaluation (Signed)
Physical Therapy Evaluation Patient Details Name: Dawn Sherman MRN: [id number] DOB: 06-Jul-1956 Today's Date: 10/01/2022  History of Present Illness  presented to ER secondary to progressive SOB; admitted for management of A/C respiratory failure with hypoxia secondary to A/C CHF.  s/p R thoracentesis (900cc), 09/30/22  Clinical Impression  Patient resting in bed upon arrival to room; alert and oriented, follows commands and agreeable to participation with session.  Denies pain; does endorse noted improvement in respiratory status/SOB since admission.  Resting on RA upon arrival to room (84%), requiring reapplication of O2 at 3L via [place] for recovery >90%. Bilat UE/LE strength and ROM grossly symmetrical and WFL; no focal weakness appreciated.  Able to complete bed mobility with mod indep; sit/stand, basic transfers and gait (220') without assist device, close sup/mod indep.  Demonstrates reciprocal stepping pattern with fair step height/length; mild sway/deviation with head turns and fatigue (LE step strategy for recovery). BORG 4/10 with distance; sats 89-90% on 3L with gait efforts  May benefit from home O2 at discharge given need for O2 at rest and with exertion noted during session; care team informed/aware. Would benefit from skilled PT to address above deficits and promote optimal return to PLOF.; Recommend transition to HHPT upon discharge from acute hospitalization.  SaO2 on room air at rest = 84-85% SaO2 on 3L at rest = 93-94% SaO2 on 3 liters of O2 while ambulating = 89-90%       Recommendations for follow up therapy are one component of a multi-disciplinary discharge planning process, led by the attending physician.  Recommendations may be updated based on patient status, additional functional criteria and insurance authorization.  Follow Up Recommendations Home health PT      Assistance Recommended at Discharge PRN  Patient can return home with the following  A little help with  walking and/or transfers;A little help with bathing/dressing/bathroom    Equipment Recommendations    Recommendations for Other Services       Functional Status Assessment Patient has had a recent decline in their functional status and demonstrates the ability to make significant improvements in function in a reasonable and predictable amount of time.     Precautions / Restrictions Precautions Precautions: Fall Restrictions Weight Bearing Restrictions: No      Mobility  Bed Mobility Overal bed mobility: Independent                  Transfers Overall transfer level: Needs assistance Equipment used: None Transfers: Sit to/from Stand Sit to Stand: Supervision, Modified independent (Device/Increase time)                Ambulation/Gait Ambulation/Gait assistance: Supervision Gait Distance (Feet): 220 Feet Assistive device: None         General Gait Details: reciprocal stepping pattern with fair step height/length; mild sway/deviation with head turns and fatigue (LE step strategy for recovery).  BORG 4/10 with distance; sats 89-90% on 3L with gait efforts  Stairs            Wheelchair Mobility    Modified Rankin (Stroke Patients Only)       Balance Overall balance assessment: Needs assistance Sitting-balance support: No upper extremity supported, Feet supported Sitting balance-Leahy Scale: Normal     Standing balance support: No upper extremity supported Standing balance-Leahy Scale: Good                               Pertinent Vitals/Pain Pain Assessment  Pain Assessment: No/denies pain    Home Living Family/patient expects to be discharged to:: Private residence Living Arrangements: Spouse/significant other Available Help at Discharge: Family;Available 24 hours/day Type of Home: House Home Access: Stairs to enter Entrance Stairs-Rails: None Entrance Stairs-Number of Steps: 3-4   Home Layout: One level Home Equipment:  Cane - single point      Prior Function Prior Level of Function : Independent/Modified Independent             Mobility Comments: Denies fall history; home O2 at night (3L) since last admission       Hand Dominance   Dominant Hand: Right    Extremity/Trunk Assessment   Upper Extremity Assessment Upper Extremity Assessment: Overall WFL for tasks assessed    Lower Extremity Assessment Lower Extremity Assessment: Overall WFL for tasks assessed       Communication   Communication: No difficulties  Cognition Arousal/Alertness: Awake/alert Behavior During Therapy: WFL for tasks assessed/performed Overall Cognitive Status: Within Functional Limits for tasks assessed                                          General Comments      Exercises Other Exercises Other Exercises: Reviewed role of activity pacing and energy conservation, gradual return to activity at discharge, O2 line management/safety; patient voiced understanding of all information.   Assessment/Plan    PT Assessment Patient needs continued PT services  PT Problem List Decreased balance;Decreased mobility;Cardiopulmonary status limiting activity       PT Treatment Interventions Gait training;Stair training;Functional mobility training;Therapeutic activities;Therapeutic exercise;Balance training;Patient/family education    PT Goals (Current goals can be found in the Care Plan section)  Acute Rehab PT Goals Patient Stated Goal: to go home! PT Goal Formulation: With patient Time For Goal Achievement: 10/08/22 Potential to Achieve Goals: Good    Frequency Min 2X/week     Co-evaluation               AM-PAC PT "6 Clicks" Mobility  Outcome Measure Help needed turning from your back to your side while in a flat bed without using bedrails?: None Help needed moving from lying on your back to sitting on the side of a flat bed without using bedrails?: None Help needed moving to and  from a bed to a chair (including a wheelchair)?: None Help needed standing up from a chair using your arms (e.g., wheelchair or bedside chair)?: None Help needed to walk in hospital room?: None Help needed climbing 3-5 steps with a railing? : A Little 6 Click Score: 23    End of Session Equipment Utilized During Treatment: Oxygen Activity Tolerance: Patient tolerated treatment well Patient left:  (patient assisting self to/from bathroom (level 6, fall risk 7), alarm not required, has been self-mobilizing prior to eval) Nurse Communication: Mobility status PT Visit Diagnosis: Muscle weakness (generalized) (M62.81)    Time: 0981-1914 PT Time Calculation (min) (ACUTE ONLY): 14 min   Charges:   PT Evaluation $PT Eval Low Complexity: 1 Low         Anitha Kreiser H. Owens Shark, PT, DPT, NCS 10/01/22, 11:01 AM [phone]

## 2022-10-02 ENCOUNTER — Telehealth: Payer: Self-pay | Admitting: *Deleted

## 2022-10-02 NOTE — Telephone Encounter (Signed)
Letter printed for signature

## 2022-10-02 NOTE — Telephone Encounter (Signed)
Letter emailed. Husband aware. HFU scheduled. Needs TCM

## 2022-10-02 NOTE — Patient Outreach (Signed)
  Care Coordination [facility] Note Transition Care Management Follow-up Telephone Call Date of discharge and from where: [facility] [phone] SOB, Pneumonia How have you been since you were released from the hospital? Doing much better. I used my oxygen at night but it cam off. RN explained that some patients tape it on the side on the face Any questions or concerns? No  Items Reviewed: Did the pt receive and understand the discharge instructions provided? Yes  Medications obtained and verified? Yes  Other? No  Any new allergies since your discharge? No  Dietary orders reviewed? No Do you have support at home? Yes   Home Care and Equipment/Supplies: Were home health services ordered? Yes PT If so, what is the name of the agency? [facility]  Has the agency set up a time to come to the patient's home? yes Were any new equipment or medical supplies ordered?  Yes: Oxygen What is the name of the medical supply agency? [facility] Were you able to get the supplies/equipment? yes Do you have any questions related to the use of the equipment or supplies? No  Functional Questionnaire: (I = Independent and D = Dependent) ADLs: I  Bathing/Dressing- I  Meal Prep- D  Eating- I  Maintaining continence- I  Transferring/Ambulation- I  Managing Meds- U  Follow up appointments reviewed:  PCP Hospital f/u appt confirmed? Yes  Scheduled to see Dr [name] [phone] 11:00  [facility] f/u appt confirmed? Yes   [id number] 9:40 Dr Rockey Situ [phone] Darylene Price 11:00 Are transportation arrangements needed? No  If their condition worsens, is the pt aware to call PCP or go to the Emergency Dept.? Yes Was the patient provided with contact information for the PCP's office or ED? Yes Was to pt encouraged to call back with questions or concerns? Yes  SDOH assessments and interventions completed:   Yes SDOH Interventions Today    Flowsheet Row Most Recent Value  SDOH Interventions   Food Insecurity Interventions  Intervention Not Indicated  Housing Interventions Intervention Not Indicated  Transportation Interventions Intervention Not Indicated       Care Coordination Interventions:  RN discussed stop smoking. Patient opted to stop without any aid since she had not smoked in 8 days since illness.    Encounter Outcome:  Pt. Visit Completed    [facility] [phone]

## 2022-10-03 ENCOUNTER — Ambulatory Visit: Payer: Medicare Other | Admitting: Internal Medicine

## 2022-10-03 NOTE — Telephone Encounter (Signed)
FYI Dr [name]- pt is seeing you for HFU on 2/14 and TCM has been completed per [facility] note. Nothing further needed at this time just wanted you to be aware.

## 2022-10-04 ENCOUNTER — Telehealth: Payer: Self-pay

## 2022-10-04 NOTE — Telephone Encounter (Signed)
Posthospital discharge phone call.  Mild to patient, she had been discharged from the hospital on February 6.  She states that she has gotten all her medications.  And taking as ordered.  She has home health following along with physical therapy.  She states yesterday that she took 328 steps with the therapist.  She does not have any questions about her medications that she has not had to take an extra water pill since discharge.  She has follow-up in the outpatient heart failure clinic on February 20 at 11:00.  Patient states that she has this on her calendar.  She states that she is weighing herself daily and today's weight was 116.  When asked about her dry weight she feels that her normal weight is between 115 and 116.  She states that she has been sleeping well and did not have any further questions.  Pricilla Riffle RN [facility]

## 2022-10-07 NOTE — Progress Notes (Unsigned)
Date:  10/08/2022   ID:  Dawn Sherman, DOB [birth year], MRN [id number]  Patient Location:  [place] [id number]   Provider location:   Dawn Sherman, [place] office  PCP:  Dawn Pheasant, MD  Cardiologist:  Dawn Sherman   Chief Complaint  Patient presents with   [facility] follow up     Patient c/o edema in ankles & has shortness of breath with little to no activity. Medications reviewed by the patient verbally.     History of Present Illness:    Dawn Sherman is a 67 y.o. female  past medical history of smoker, COPD, quit smoking 1/24 prior alcohol,  chronic kidney disease,  hypertension,  hospital June 2021 with worsening PND orthopnea, congestive heart failure symptoms, " panic attack" Smoker Normal ejection fraction April 2023 EF down to 35% in 2/24 with CHF sx Who presents for follow-up of her diastolic and systolic CHF, PAF  Last seen by myself in clinic August 2023  Echocardiogram April 2023 ejection fraction 65 to 70% Echo July 01, 2022 EF 50 to 55%  hospital admission February 2024 Hospital records reviewed in detail Cardiology was not consulted  for acute on chronic diastolic CHF, respiratory failure, COPD Moderate right pleural effusion on CT Echo ejection fraction 35 to 40% severely dilated left and right atrium, moderate MR Thoracentesis completed Hemoglobin 9.7, down from 11 Went home from hospital 10/01/22  In follow-up today reports legs are getting more swollen since she left the hospital several days ago Compliant with her furosemide 40 daily, often taking 40 twice daily Despite this, swelling has persisted Denies excessive fluid intake at home, reports she has cut way back  Reports that she quit smoking last month  EKG personally reviewed by myself on todays visit Normal sinus rhythm rate 81 bpm diffuse ST-T wave abnormality V3 through V6, limb leads  Other past medical history reviewed  hospital  June 2021 Echocardiogram confirming ejection fraction 30%, global Unable to exclude hypertensive cardiomyopath In the setting of paroxysmal atrial fibrillation  pleural effusion on CT scan  Prior records reviewed hyperkalemia with K as high as 6.9 on 08/04/20 and her Delene Loll has had to be discontinued. ACE and ARB avoided   Past Medical History:  Diagnosis Date   B12 deficiency    CHF (congestive heart failure) (HCC)    CKD (chronic kidney disease), stage III (HCC)    COPD (chronic obstructive pulmonary disease) ([place])    Depression    Endometriosis    Hypertension    Mixed hyperlipidemia    Tobacco abuse    Past Surgical History:  Procedure Laterality Date   ABDOMINAL HYSTERECTOMY     BREAST BIOPSY Right 2015   neg-  FIBROADENOMA   TAH/RSO  1999   secondary to bleeding and endometriosis (Dr Dawn Sherman)      Allergies:   Sulfate, Codeine sulfate, Benicar [olmesartan], Amoxicillin, Clindamycin/lincomycin, Entresto [sacubitril-valsartan], Morphine and related, and Penicillins   Social History   Tobacco Use   Smoking status: Former    Packs/day: 0.25    Years: 40.00    Total pack years: 10.00    Types: Cigarettes   Smokeless tobacco: Never   Tobacco comments:    Patient quit smoking recently   Vaping Use   Vaping Use: Never used  Substance Use Topics   Alcohol use: Not Currently    Alcohol/week: 0.0 standard drinks of alcohol    Comment: occasional  Drug use: No     Current Outpatient Medications on File Prior to Visit  Medication Sig Dispense Refill   acetaminophen (TYLENOL) 650 MG CR tablet Take 650 mg by mouth every 8 (eight) hours as needed for pain or fever.     albuterol (VENTOLIN HFA) 108 (90 Base) MCG/ACT inhaler Inhale 2 puffs into the lungs every 6 (six) hours as needed for wheezing or shortness of breath. 8 g 2   amiodarone (PACERONE) 200 MG tablet Take 1 tablet (200 mg total) by mouth daily. 30 tablet 1   apixaban (ELIQUIS) 2.5 MG TABS tablet Take 1  tablet (2.5 mg total) by mouth 2 (two) times daily. 60 tablet 6   atorvastatin (LIPITOR) 40 MG tablet TAKE ONE TABLET BY MOUTH EVERY DAY 90 tablet 0   buPROPion (WELLBUTRIN XL) 150 MG 24 hr tablet TAKE ONE TABLET BY MOUTH EVERY DAY 90 tablet 0   calcium carbonate (OS-CAL - DOSED IN MG OF ELEMENTAL CALCIUM) 1250 (500 Ca) MG tablet Take 1 tablet (500 mg of elemental calcium total) by mouth 3 (three) times daily with meals. 60 tablet 1   diltiazem (CARDIZEM CD) 120 MG 24 hr capsule Take 1 capsule (120 mg total) by mouth daily. 30 capsule 3   FARXIGA 10 MG TABS tablet Take 10 mg by mouth daily.     Fluticasone-Umeclidin-Vilant (TRELEGY ELLIPTA) 100-62.5-25 MCG/ACT AEPB Inhale 1 puff into the lungs daily. 3 each 3   furosemide (LASIX) 40 MG tablet Take 1 tablet (40 mg total) by mouth daily. Take 40 mg daily and and extra 40 mg as needed for fluid retention 45 tablet 6   loratadine (CLARITIN) 10 MG tablet Take 10 mg by mouth daily.     omeprazole (PRILOSEC) 20 MG capsule Take 20 mg by mouth daily.     venlafaxine XR (EFFEXOR-XR) 150 MG 24 hr capsule TAKE ONE CAPSULE BY MOUTH EVERY DAY 90 capsule 1   nicotine (NICODERM CQ - DOSED IN MG/24 HOURS) 21 mg/24hr patch Place 1 patch (21 mg total) onto the skin daily. (Patient not taking: Reported on 10/08/2022) 28 patch 0   No current facility-administered medications on file prior to visit.     Family Hx: The patient's family history includes Fibromyalgia in her daughter; Hypercholesterolemia in her mother; Rheum arthritis in her daughter and father. There is no history of Breast cancer or Colon cancer.  ROS:   Please see the history of present illness.    Review of Systems  Constitutional: Negative.   HENT: Negative.    Respiratory: Negative.    Cardiovascular: Negative.   Gastrointestinal: Negative.   Musculoskeletal: Negative.   Neurological: Negative.   Psychiatric/Behavioral: Negative.    All other systems reviewed and are negative.     Labs/Other Tests and Data Reviewed:    Recent Labs: 07/26/2022: TSH 12.62 09/07/2022: ALT 24 09/27/2022: B Natriuretic Peptide >4,500.0 09/29/2022: Magnesium 2.0 09/30/2022: BUN 92; Creatinine, Ser 2.77; Hemoglobin 9.7; Platelets 254; Potassium 4.7; Sodium 140   Recent Lipid Panel Lab Results  Component Value Date/Time   CHOL 175 09/24/2022 05:25 AM   TRIG 46 09/24/2022 05:25 AM   HDL 87 09/24/2022 05:25 AM   CHOLHDL 2.0 09/24/2022 05:25 AM   LDLCALC 79 09/24/2022 05:25 AM   LDLDIRECT 53.3 10/14/2012 08:21 AM    Wt Readings from Last 3 Encounters:  10/08/22 121 lb 6 oz (55.1 kg)  10/01/22 119 lb 1.6 oz (54 kg)  09/07/22 129 lb (58.5 kg)  Exam:    Vital Signs: Vital signs may also be detailed in the HPI BP (!) 140/80 (BP Location: Left Arm, Patient Position: Sitting, Cuff Size: Normal)   Pulse 81   Ht 5' 5"$  (1.651 m)   Wt 121 lb 6 oz (55.1 kg)   SpO2 94%   BMI 20.20 kg/m   Constitutional:  oriented to person, place, and time. No distress.  HENT:  Head: Grossly normal Eyes:  no discharge. No scleral icterus.  Neck: No JVD, no carotid bruits  Cardiovascular: Regular rate and rhythm, no murmurs appreciated Trace pitting lower extremity edema bilaterally Pulmonary/Chest: Clear to auscultation bilaterally, no wheezes or rails Dullness at the bases worse on the right than the left Abdominal: Soft.  no distension.  no tenderness.  Musculoskeletal: Normal range of motion Neurological:  normal muscle tone. Coordination normal. No atrophy Skin: Skin warm and dry Psychiatric: normal affect, pleasant   ASSESSMENT & PLAN:    Problem List Items Addressed This Visit       Cardiology Problems   HLD (hyperlipidemia)   Essential hypertension - Primary     Other   Tobacco use   Chronic obstructive pulmonary disease ([place])   Other Visit Diagnoses     Chronic combined systolic and diastolic CHF (congestive heart failure) (HCC)       PAF (paroxysmal atrial fibrillation)  (HCC)       Lymphedema       Chronic renal impairment, stage 3b (HCC)       Obstructive sleep apnea syndrome       Stage 3b chronic kidney disease ([place])         Dilated cardiomyopathy Ejection fraction normalized on echocardiogram April 2023 Drop in ejection fraction noted on recent hospitalization EF 35% -Did not tolerate ARB, Entresto secondary to hyperkalemia in the setting of renal failure Previously on carvedilol and Farxiga, appears medications were changed off carvedilol placed on diltiazem Given cardiomyopathy will stop the diltiazem, placed back on carvedilol 12.5 twice daily Will stop Lasix and changed to torsemide 40 daily with extra 40 in the afternoon for ankle swelling with potassium Continue Farxiga -Unable to exclude ischemia, not a good candidate for cardiac CTA or cardiac catheterization given renal dysfunction creatinine greater than 2 -Will recommend pharmacologic Myoview, last performed 3 years ago  Paroxysmal atrial fibrillation Restart carvedilol, continue Eliquis, amiodarone  Chronic kidney disease Stable creatinine >2 Close monitoring on torsemide  Hyperlipidemia Recommend Lipitor daily   Total encounter time more than 30 minutes  Greater than 50% was spent in counseling and coordination of care with the patient    Signed, Ida Rogue, [place] [facility] [address] [place] [address], [place], [place] [address]

## 2022-10-08 ENCOUNTER — Ambulatory Visit: Payer: Medicare Other | Attending: Cardiovascular Disease | Admitting: Cardiovascular Disease

## 2022-10-08 ENCOUNTER — Encounter: Payer: Self-pay | Admitting: Cardiovascular Disease

## 2022-10-08 ENCOUNTER — Telehealth: Payer: Self-pay | Admitting: Cardiovascular Disease

## 2022-10-08 VITALS — BP 140/80 | HR 81 | Ht 65.0 in | Wt 121.4 lb

## 2022-10-08 DIAGNOSIS — G4733 Obstructive sleep apnea (adult) (pediatric): Secondary | ICD-10-CM | POA: Diagnosis present

## 2022-10-08 DIAGNOSIS — J432 Centrilobular emphysema: Secondary | ICD-10-CM

## 2022-10-08 DIAGNOSIS — I480 Paroxysmal atrial fibrillation: Secondary | ICD-10-CM | POA: Diagnosis not present

## 2022-10-08 DIAGNOSIS — E782 Mixed hyperlipidemia: Secondary | ICD-10-CM

## 2022-10-08 DIAGNOSIS — N1832 Chronic kidney disease, stage 3b: Secondary | ICD-10-CM | POA: Insufficient documentation

## 2022-10-08 DIAGNOSIS — Z720 Tobacco use: Secondary | ICD-10-CM | POA: Diagnosis not present

## 2022-10-08 DIAGNOSIS — I5042 Chronic combined systolic (congestive) and diastolic (congestive) heart failure: Secondary | ICD-10-CM | POA: Insufficient documentation

## 2022-10-08 DIAGNOSIS — I890 Lymphedema, not elsewhere classified: Secondary | ICD-10-CM

## 2022-10-08 DIAGNOSIS — I10 Essential (primary) hypertension: Secondary | ICD-10-CM

## 2022-10-08 MED ORDER — TORSEMIDE 40 MG PO TABS: 40.0000 mg | ORAL_TABLET | Freq: Every day | ORAL | 3 refills | Status: DC

## 2022-10-08 MED ORDER — TORSEMIDE 20 MG PO TABS: ORAL_TABLET | ORAL | 3 refills | Status: DC

## 2022-10-08 MED ORDER — CARVEDILOL 12.5 MG PO TABS: 12.5000 mg | ORAL_TABLET | Freq: Two times a day (BID) | ORAL | 3 refills | Status: DC

## 2022-10-08 NOTE — Patient Instructions (Addendum)
Medication Instructions:  Stop lasix Start torsemide 40 mg daily, May take an extra torsemide 40 in the afternoon for leg swelling  Stop the diltiazem Start coreg 12.5 mg twice a day  If you need a refill on your cardiac medications before your next appointment, please call your pharmacy.   Lab work: No new labs needed  Testing/Procedures: Your provider has ordered a Lexiscan/ Exercise Myoview Stress test. This will take place at [facility]. Please report to the [facility] medical mall entrance. The volunteers at the first desk will direct you where to go.  [place]  Your provider has ordered a Stress Test with nuclear imaging. The purpose of this test is to evaluate the blood supply to your heart muscle. This procedure is referred to as a "Non-Invasive Stress Test." This is because other than having an IV started in your vein, nothing is inserted or "invades" your body. Cardiac stress tests are done to find areas of poor blood flow to the heart by determining the extent of coronary artery disease (CAD). Some patients exercise on a treadmill, which naturally increases the blood flow to your heart, while others who are unable to walk on a treadmill due to physical limitations will have a pharmacologic/chemical stress agent called Lexiscan . This medicine will mimic walking on a treadmill by temporarily increasing your coronary blood flow.   Please note: these test may take anywhere between 2-4 hours to complete  How to prepare for your Myoview test:  Nothing to eat for 6 hours prior to the test No caffeine for 24 hours prior to test No smoking 24 hours prior to test. Your medication may be taken with water.  If your doctor stopped a medication because of this test, do not take that medication. Ladies, please do not wear dresses.  Skirts or pants are appropriate. Please wear a short sleeve shirt. No perfume, cologne or lotion. Wear comfortable walking shoes. No heels!   PLEASE NOTIFY THE OFFICE  AT LEAST 23 HOURS IN ADVANCE IF YOU ARE UNABLE TO KEEP YOUR APPOINTMENT.  [phone] AND  PLEASE NOTIFY NUCLEAR MEDICINE AT [facility] AT LEAST 24 HOURS IN ADVANCE IF YOU ARE UNABLE TO KEEP YOUR APPOINTMENT. [phone]   Follow-Up: At [facility], you and your health needs are our priority.  As part of our continuing mission to provide you with exceptional heart care, we have created designated Provider Care Teams.  These Care Teams include your primary Cardiologist (physician) and Advanced Practice Providers (APPs -  Physician Assistants and Nurse Practitioners) who all work together to provide you with the care you need, when you need it.  You will need a follow up appointment in 1 month  Providers on your designated Care Team:   Murray Hodgkins, NP Christell Faith, PA-C Cadence Kathlen Mody, [place]  COVID-19 Vaccine Information can be found at: [email or web address].uk For questions related to vaccine distribution or appointments, please email [email or web address] or call [phone].

## 2022-10-08 NOTE — Telephone Encounter (Signed)
  Pt c/o medication issue:  1. Name of Medication: Torsemide 40 MG TABS   2. How are you currently taking this medication (dosage and times per day)?   Take 40 mg by mouth daily. (May take an extra 40 mg in the evening for leg swelling)    3. Are you having a reaction (difficulty breathing--STAT)? No   4. What is your medication issue? [employer], manufacturing systems called, she said, the Torsemide 40 mg they have is only the brand name soaanz and it might be expensive, but they have torsemide 20 mg that they can fill for the pt its generic and available in stock. She said, if Dr. Rockey Situ will be ok sending new prescription for the 20 mg and pt can take 2 tablets a day.

## 2022-10-08 NOTE — Telephone Encounter (Signed)
I called and spoke with the patient and advised her of the call that we received from [employer] regarding Torsemide 40 mg tablets- that they only carry the branded form and this may be expensive.  I inquired if she would not mind [place] sending in Torsemide 20 mg: Take 2 tablets (40 mg) once daily.  Per the patient, she is fine taking 2- 20 mg tablets of generic torsemide daily.  I advised her I will update this at the pharmacy, but wanted her to be aware of the change in the directions from 1 tablet to 2 tablets daily due to the tablet strength,   The patient voices understanding and is agreeable.

## 2022-10-09 ENCOUNTER — Ambulatory Visit (INDEPENDENT_AMBULATORY_CARE_PROVIDER_SITE_OTHER): Payer: Medicare Other | Admitting: Internal Medicine

## 2022-10-09 ENCOUNTER — Encounter: Payer: Self-pay | Admitting: Internal Medicine

## 2022-10-09 ENCOUNTER — Telehealth: Payer: Self-pay

## 2022-10-09 VITALS — BP 128/76 | HR 80 | Temp 97.9°F | Resp 16 | Ht 65.0 in | Wt 122.6 lb

## 2022-10-09 DIAGNOSIS — D649 Anemia, unspecified: Secondary | ICD-10-CM

## 2022-10-09 DIAGNOSIS — E7800 Pure hypercholesterolemia, unspecified: Secondary | ICD-10-CM

## 2022-10-09 DIAGNOSIS — R739 Hyperglycemia, unspecified: Secondary | ICD-10-CM

## 2022-10-09 DIAGNOSIS — F320 Major depressive disorder, single episode, mild: Secondary | ICD-10-CM

## 2022-10-09 DIAGNOSIS — I5032 Chronic diastolic (congestive) heart failure: Secondary | ICD-10-CM

## 2022-10-09 DIAGNOSIS — J439 Emphysema, unspecified: Secondary | ICD-10-CM

## 2022-10-09 DIAGNOSIS — I480 Paroxysmal atrial fibrillation: Secondary | ICD-10-CM

## 2022-10-09 DIAGNOSIS — I10 Essential (primary) hypertension: Secondary | ICD-10-CM

## 2022-10-09 DIAGNOSIS — N184 Chronic kidney disease, stage 4 (severe): Secondary | ICD-10-CM

## 2022-10-09 DIAGNOSIS — K219 Gastro-esophageal reflux disease without esophagitis: Secondary | ICD-10-CM

## 2022-10-09 DIAGNOSIS — Z720 Tobacco use: Secondary | ICD-10-CM

## 2022-10-09 MED ORDER — BUPROPION HCL ER (XL) 150 MG PO TB24: 150.0000 mg | ORAL_TABLET | Freq: Every day | ORAL | 1 refills | Status: DC

## 2022-10-09 MED ORDER — ATORVASTATIN CALCIUM 40 MG PO TABS: 40.0000 mg | ORAL_TABLET | Freq: Every day | ORAL | 1 refills | Status: DC

## 2022-10-09 MED ORDER — VENLAFAXINE HCL ER 150 MG PO CP24: ORAL_CAPSULE | ORAL | 1 refills | Status: DC

## 2022-10-09 NOTE — Telephone Encounter (Signed)
2 boxes of samples given to patient at [facility] appt  Trellegy 100-62.5-25 mcg LOT: [id number] EXP: 11/2023

## 2022-10-09 NOTE — Progress Notes (Signed)
Subjective:    Patient ID: Dawn Sherman, female    DOB: 05-Sep-1955, 67 y.o.   MRN: [id number]  Patient here for  Chief Complaint  Patient presents with   Hospitalization Follow-up    HPI Hospital follow up.  Admitted 09/23/22 - 10/01/22 - after presenting with worsening sob, productive cough, wheezing and chest pain.  Requires nocturnal oxygen - 3L.  Chest x-ray showed cardiomegaly, small to moderate right pleural effusion, interstitial edema. She was admitted for further evaluation and management of acute respiratory failure with hypoxia due to combination of COPD and decompensated CHF with edema.  Started on IV diuresis and IV steroids. Noncontrast CT chest showing Moderate right pleural effusion. Right middle lobe and lower lobe atelectasis. Patchy areas of airspace disease in the right upper lobe and right lower lobe concerning for pneumonia.  2D echo which showed 35 to 40% EF decreased from her prior EF with global hypokinesia. Completed 5 day course of doxycycline. Thoracentesis removed approximately 900cc fluid. Continues on 3L [place]. Oxygen prescribed as continuous. PT/OT evaluated and recommended home health. Reports that she is feeling better.  States breathing is ok during the day.  Cannot lie down flat - affects breathing.  She has stopped smoking.  No increased cough.  Planning for stress test Monday.  Some constipation.  Is following her weight.  Discussed weighing daily.  Discussed continued home healt.     Past Medical History:  Diagnosis Date   B12 deficiency    CHF (congestive heart failure) (HCC)    CKD (chronic kidney disease), stage III (HCC)    COPD (chronic obstructive pulmonary disease) (HCC)    Depression    Endometriosis    Hypertension    Mixed hyperlipidemia    Tobacco abuse    Past Surgical History:  Procedure Laterality Date   ABDOMINAL HYSTERECTOMY     BREAST BIOPSY Right 2015   neg-  FIBROADENOMA   TAH/RSO  1999   secondary to bleeding and endometriosis  (Dr Vernie Ammons)   Family History  Problem Relation Age of Onset   Hypercholesterolemia Mother    Rheum arthritis Father    Rheum arthritis Daughter    Fibromyalgia Daughter    Breast cancer Neg Hx    Colon cancer Neg Hx    Social History   Socioeconomic History   Marital status: Married    Spouse name: Not on file   Number of children: Not on file   Years of education: Not on file   Highest education level: Not on file  Occupational History   Not on file  Tobacco Use   Smoking status: Former    Packs/day: 0.25    Years: 40.00    Total pack years: 10.00    Types: Cigarettes   Smokeless tobacco: Never   Tobacco comments:    Patient quit smoking recently   Vaping Use   Vaping Use: Never used  Substance and Sexual Activity   Alcohol use: Not Currently    Alcohol/week: 0.0 standard drinks of alcohol    Comment: occasional   Drug use: No   Sexual activity: Not Currently  Other Topics Concern   Not on file  Social History Narrative   Lives in [place] with her husband.  She is retired from [employer].  She does not routinely exercise.   Social Determinants of Health   Financial Resource Strain: Low Risk  (11/27/2021)   Overall Financial Resource Strain (CARDIA)    Difficulty of Paying  Living Expenses: Not hard at all  Food Insecurity: No Food Insecurity (10/02/2022)   Hunger Vital Sign    Worried About Running Out of Food in the Last Year: Never true    Ran Out of Food in the Last Year: Never true  Transportation Needs: No Transportation Needs (10/02/2022)   PRAPARE - Hydrologist (Medical): No    Lack of Transportation (Non-Medical): No  Physical Activity: Sufficiently Active (11/27/2021)   Exercise Vital Sign    Days of Exercise per Week: 7 days    Minutes of Exercise per Session: 30 min  Stress: No Stress Concern Present (11/27/2021)   [place]    Feeling of  Stress : Not at all  Social Connections: Unknown (11/27/2021)   Social Connection and Isolation Panel [NHANES]    Frequency of Communication with Friends and Family: Not on file    Frequency of Social Gatherings with Friends and Family: Not on file    Attends Religious Services: Not on file    Active Member of Clubs or Organizations: Not on file    Attends Archivist Meetings: Not on file    Marital Status: Married     Review of Systems  Constitutional:  Positive for fatigue. Negative for unexpected weight change.  HENT:  Negative for congestion and sinus pressure.   Respiratory:  Negative for cough and chest tightness.        Breathing as outlined.   Cardiovascular:  Negative for chest pain and palpitations.       No increased swelling.   Gastrointestinal:  Negative for abdominal pain, diarrhea, nausea and vomiting.  Genitourinary:  Negative for difficulty urinating and dysuria.  Musculoskeletal:  Negative for joint swelling and myalgias.  Skin:  Negative for color change and rash.  Neurological:  Negative for dizziness and headaches.  Psychiatric/Behavioral:  Negative for agitation and dysphoric mood.        Objective:     BP 128/76   Pulse 80   Temp 97.9 F (36.6 C)   Resp 16   Ht 5' 5"$  (1.651 m)   Wt 122 lb 9.6 oz (55.6 kg)   SpO2 95%   BMI 20.40 kg/m  Wt Readings from Last 3 Encounters:  10/09/22 122 lb 9.6 oz (55.6 kg)  10/08/22 121 lb 6 oz (55.1 kg)  10/01/22 119 lb 1.6 oz (54 kg)    Physical Exam Vitals reviewed.  Constitutional:      General: She is not in acute distress.    Appearance: Normal appearance.  HENT:     Head: Normocephalic and atraumatic.     Right Ear: External ear normal.     Left Ear: External ear normal.  Eyes:     General: No scleral icterus.       Right eye: No discharge.        Left eye: No discharge.     Conjunctiva/sclera: Conjunctivae normal.  Neck:     Thyroid: No thyromegaly.  Cardiovascular:     Rate and  Rhythm: Normal rate and regular rhythm.  Pulmonary:     Effort: No respiratory distress.     Breath sounds: Normal breath sounds. No wheezing.  Abdominal:     General: Bowel sounds are normal.     Palpations: Abdomen is soft.     Tenderness: There is no abdominal tenderness.  Musculoskeletal:        General: No swelling or tenderness.  Cervical back: Neck supple. No tenderness.  Lymphadenopathy:     Cervical: No cervical adenopathy.  Skin:    Findings: No erythema or rash.  Neurological:     Mental Status: She is alert.  Psychiatric:        Mood and Affect: Mood normal.        Behavior: Behavior normal.      Outpatient Encounter Medications as of 10/09/2022  Medication Sig   acetaminophen (TYLENOL) 650 MG CR tablet Take 650 mg by mouth every 8 (eight) hours as needed for pain or fever.   albuterol (VENTOLIN HFA) 108 (90 Base) MCG/ACT inhaler Inhale 2 puffs into the lungs every 6 (six) hours as needed for wheezing or shortness of breath.   amiodarone (PACERONE) 200 MG tablet Take 1 tablet (200 mg total) by mouth daily.   apixaban (ELIQUIS) 2.5 MG TABS tablet Take 1 tablet (2.5 mg total) by mouth 2 (two) times daily.   atorvastatin (LIPITOR) 40 MG tablet Take 1 tablet (40 mg total) by mouth daily.   buPROPion (WELLBUTRIN XL) 150 MG 24 hr tablet Take 1 tablet (150 mg total) by mouth daily.   calcium carbonate (OS-CAL - DOSED IN MG OF ELEMENTAL CALCIUM) 1250 (500 Ca) MG tablet Take 1 tablet (500 mg of elemental calcium total) by mouth 3 (three) times daily with meals.   carvedilol (COREG) 12.5 MG tablet Take 1 tablet (12.5 mg total) by mouth 2 (two) times daily.   FARXIGA 10 MG TABS tablet Take 10 mg by mouth daily.   Fluticasone-Umeclidin-Vilant (TRELEGY ELLIPTA) 100-62.5-25 MCG/ACT AEPB Inhale 1 puff into the lungs daily.   loratadine (CLARITIN) 10 MG tablet Take 10 mg by mouth daily.   omeprazole (PRILOSEC) 20 MG capsule Take 20 mg by mouth daily.   torsemide (DEMADEX) 20 MG  tablet Take 2 tablets (40 mg) by mouth once daily. You may take an extra 2 tablets (40 mg) in the afternoon as needed for leg swelling   venlafaxine XR (EFFEXOR-XR) 150 MG 24 hr capsule TAKE ONE CAPSULE BY MOUTH EVERY DAY   [DISCONTINUED] atorvastatin (LIPITOR) 40 MG tablet TAKE ONE TABLET BY MOUTH EVERY DAY   [DISCONTINUED] buPROPion (WELLBUTRIN XL) 150 MG 24 hr tablet TAKE ONE TABLET BY MOUTH EVERY DAY   [DISCONTINUED] nicotine (NICODERM CQ - DOSED IN MG/24 HOURS) 21 mg/24hr patch Place 1 patch (21 mg total) onto the skin daily. (Patient not taking: Reported on 10/08/2022)   [DISCONTINUED] venlafaxine XR (EFFEXOR-XR) 150 MG 24 hr capsule TAKE ONE CAPSULE BY MOUTH EVERY DAY   No facility-administered encounter medications on file as of 10/09/2022.     Lab Results  Component Value Date   WBC 10.3 09/30/2022   HGB 9.7 (L) 09/30/2022   HCT 31.7 (L) 09/30/2022   PLT 254 09/30/2022   GLUCOSE 123 (H) 09/30/2022   CHOL 175 09/24/2022   TRIG 46 09/24/2022   HDL 87 09/24/2022   LDLDIRECT 53.3 10/14/2012   LDLCALC 79 09/24/2022   ALT 24 09/07/2022   AST 29 09/07/2022   NA 140 09/30/2022   K 4.7 09/30/2022   CL 101 09/30/2022   CREATININE 2.77 (H) 09/30/2022   BUN 92 (H) 09/30/2022   CO2 26 09/30/2022   TSH 12.62 (H) 07/26/2022   INR 1.1 09/23/2022   HGBA1C 5.6 09/23/2022    ECHOCARDIOGRAM LIMITED  Result Date: 09/28/2022    ECHOCARDIOGRAM LIMITED REPORT   Patient Name:   LACHLYN MEER Date of Exam: 09/28/2022 Medical Rec #:  [id number]     Height:       65.0 in Accession #:    [id number]    Weight:       122.3 lb Date of Birth:  [birth year]    BSA:          1.605 m Patient Age:    87 years      BP:           127/79 mmHg Patient Gender: F             HR:           96 bpm. Exam Location:  [facility] Procedure: Limited Echo Indications:     CHF- Acute Diastolic 99991111  History:         Patient has prior history of Echocardiogram examinations.  Sonographer:     Wayland Salinas RDCS Referring Phys:  [id number]  [place] Diagnosing Phys: [facility]  1. Left ventricular ejection fraction, by estimation, is 35 to 40%. The left ventricle has moderately decreased function. The left ventricle demonstrates global hypokinesis. The left ventricular internal cavity size was moderately to severely dilated.  2. Right ventricular systolic function is moderately reduced. The right ventricular size is moderately enlarged. Moderately increased right ventricular wall thickness.  3. Left atrial size was severely dilated.  4. Right atrial size was severely dilated.  5. The mitral valve is normal in structure. Moderate mitral valve regurgitation. No evidence of mitral stenosis.  6. The aortic valve is normal in structure. Aortic valve regurgitation is not visualized. No aortic stenosis is present.  7. The inferior vena cava is normal in size with greater than 50% respiratory variability, suggesting right atrial pressure of 3 mmHg. FINDINGS  Left Ventricle: Left ventricular ejection fraction, by estimation, is 35 to 40%. The left ventricle has moderately decreased function. The left ventricle demonstrates global hypokinesis. The left ventricular internal cavity size was moderately to severely dilated. There is no left ventricular hypertrophy. Right Ventricle: The right ventricular size is moderately enlarged. Moderately increased right ventricular wall thickness. Right ventricular systolic function is moderately reduced. Left Atrium: Left atrial size was severely dilated. Right Atrium: Right atrial size was severely dilated. Pericardium: There is no evidence of pericardial effusion. Mitral Valve: The mitral valve is normal in structure. Moderate mitral valve regurgitation. No evidence of mitral valve stenosis. Tricuspid Valve: The tricuspid valve is not assessed. Tricuspid valve regurgitation is not demonstrated. No evidence of tricuspid stenosis. Aortic Valve: The aortic valve is normal in structure. Aortic valve  regurgitation is not visualized. No aortic stenosis is present. Pulmonic Valve: The pulmonic valve was not assessed. Pulmonic valve regurgitation is not visualized. No evidence of pulmonic stenosis. Aorta: The aortic root is normal in size and structure. Venous: The inferior vena cava is normal in size with greater than 50% respiratory variability, suggesting right atrial pressure of 3 mmHg. IAS/Shunts: No atrial level shunt detected by color flow Doppler. LEFT VENTRICLE PLAX 2D LVIDd:         5.10 cm      Diastology LVIDs:         4.00 cm      LV e' medial:  4.68 cm/s LV PW:         1.20 cm      LV e' lateral: 6.64 cm/s LV IVS:        1.20 cm  LV Volumes (MOD) LV vol d, MOD A2C: 109.0 ml LV vol d, MOD A4C: 160.0 ml LV  vol s, MOD A2C: 72.0 ml LV vol s, MOD A4C: 92.4 ml LV SV MOD A2C:     37.0 ml LV SV MOD A4C:     160.0 ml LV SV MOD BP:      54.1 ml Neoma Laming Electronically signed by Neoma Laming Signature Date/Time: 09/28/2022/12:35:51 PM    Final        Assessment & Plan:  Essential hypertension Assessment & Plan: Continue hydralazine. Just had diltiazem changed to carvedilol.  Follow pressures.  Follow metabolic panel.  Continues on torsemide.   Orders: -     CBC with Differential/Platelet; Future -     Basic metabolic panel; Future -     TSH; Future  Hypercholesterolemia Assessment & Plan: Continue lipitor.  Low cholesterol diet and exercise.  Follow lipid panel and liver function tests.    Orders: -     Hepatic function panel; Future -     Lipid panel; Future  Hyperglycemia -     Hemoglobin A1c; Future  Anemia, unspecified type Assessment & Plan: Follow cbc.    Chronic diastolic CHF (congestive heart failure) ([place]) Assessment & Plan: Continue farxiga.  Weigh daily.  Just started on toresemide.  Taking 91m q day.  Will dose an additional 448mprn. Recent echo - this hospitalization revealed decrease in EF.  Unable to take entrestro - due to hyperkalemia.  Follow  metabolic  panel.  Feeling better, but still with fatigue.  F/u with home health - PT - exercises.     Pulmonary emphysema, unspecified emphysema type (HCColumbus CityAssessment & Plan: No increased cough or congestion.  Continues toresemide. Follow.     CKD (chronic kidney disease), stage IV (HCC) Assessment & Plan: Avoid antiinflammatories.  Will need to monitor kidney function - with torsemide use.    Depression, major, single episode, mild (HCLa JaraAssessment & Plan: Continue effexor and wellbutrin. Has good support.  Follow.    Gastroesophageal reflux disease without esophagitis Assessment & Plan: No upper symptoms reported.  On prilosec.    Paroxysmal atrial fibrillation (HDakota Surgery [facility] LLCAssessment & Plan: On amiodarone.  Continue eliquis.  In SR today.  Follow.    Tobacco use Assessment & Plan: Has stopped smoking.     Other orders -     Atorvastatin Calcium; Take 1 tablet (40 mg total) by mouth daily.  Dispense: 90 tablet; Refill: 1 -     buPROPion HCl ER (XL); Take 1 tablet (150 mg total) by mouth daily.  Dispense: 90 tablet; Refill: 1 -     Venlafaxine HCl ER; TAKE ONE CAPSULE BY MOUTH EVERY DAY  Dispense: 90 capsule; Refill: 1     ChEinar PheasantMD

## 2022-10-13 ENCOUNTER — Encounter: Payer: Self-pay | Admitting: Internal Medicine

## 2022-10-13 NOTE — Assessment & Plan Note (Signed)
Continue effexor and wellbutrin. Has good support.  Follow.

## 2022-10-13 NOTE — Assessment & Plan Note (Signed)
Continue lipitor.  Low cholesterol diet and exercise.  Follow lipid panel and liver function tests.

## 2022-10-13 NOTE — Assessment & Plan Note (Signed)
Continue hydralazine. Just had diltiazem changed to carvedilol.  Follow pressures.  Follow metabolic panel.  Continues on torsemide.

## 2022-10-13 NOTE — Assessment & Plan Note (Addendum)
Continue farxiga.  Weigh daily.  Just started on toresemide.  Taking 34m q day.  Will dose an additional 417mprn. Recent echo - this hospitalization revealed decrease in EF.  Unable to take entrestro - due to hyperkalemia.  Follow  metabolic panel.  Feeling better, but still with fatigue.  F/u with home health - PT - exercises.

## 2022-10-13 NOTE — Assessment & Plan Note (Signed)
Has stopped smoking  

## 2022-10-13 NOTE — Assessment & Plan Note (Signed)
No upper symptoms reported. On prilosec.  

## 2022-10-13 NOTE — Assessment & Plan Note (Signed)
No increased cough or congestion.  Continues toresemide. Follow.

## 2022-10-13 NOTE — Assessment & Plan Note (Signed)
Avoid antiinflammatories.  Will need to monitor kidney function - with torsemide use.

## 2022-10-13 NOTE — Assessment & Plan Note (Signed)
On amiodarone.  Continue eliquis.  In SR today.  Follow.

## 2022-10-13 NOTE — Assessment & Plan Note (Signed)
Follow cbc.  

## 2022-10-14 ENCOUNTER — Ambulatory Visit
Admission: RE | Admit: 2022-10-14 | Discharge: 2022-10-14 | Disposition: A | Discharge status: Home / Self Care | Payer: Medicare Other | Source: Ambulatory Visit | Attending: Cardiovascular Disease | Admitting: Cardiovascular Disease

## 2022-10-14 DIAGNOSIS — I5042 Chronic combined systolic (congestive) and diastolic (congestive) heart failure: Secondary | ICD-10-CM

## 2022-10-14 LAB — NM MYOCAR MULTI W/SPECT W/WALL MOTION / EF
LV dias vol: 173 mL (ref 46–106)
LV sys vol: 98 mL
Nuc Stress EF: 43 %
Peak HR: 82 {beats}/min
Percent HR: 53 %
Rest HR: 74 {beats}/min
Rest Nuclear Isotope Dose: 10.5 mCi
SDS: 8
SRS: 14
SSS: 11
Stress Nuclear Isotope Dose: 32 mCi
TID: 0.84

## 2022-10-14 MED ORDER — TECHNETIUM TC 99M TETROFOSMIN IV KIT
10.4900 | PACK | Freq: Once | INTRAVENOUS | Status: AC | PRN
  Administered 2022-10-14: 10.49 via INTRAVENOUS

## 2022-10-14 MED ORDER — TECHNETIUM TC 99M TETROFOSMIN IV KIT
31.9600 | PACK | Freq: Once | INTRAVENOUS | Status: AC | PRN
  Administered 2022-10-14: 31.96 via INTRAVENOUS

## 2022-10-14 MED ORDER — REGADENOSON 0.4 MG/5ML IV SOLN
0.4000 mg | Freq: Once | INTRAVENOUS | Status: AC
  Administered 2022-10-14: 0.4 mg via INTRAVENOUS

## 2022-10-15 ENCOUNTER — Encounter: Payer: Self-pay | Admitting: Family

## 2022-10-15 ENCOUNTER — Encounter: Payer: Self-pay | Admitting: Pharmacist

## 2022-10-15 ENCOUNTER — Ambulatory Visit: Payer: Medicare Other | Attending: Family | Admitting: Family

## 2022-10-15 VITALS — BP 137/74 | HR 77 | Resp 18 | Wt 128.4 lb

## 2022-10-15 DIAGNOSIS — Z79899 Other long term (current) drug therapy: Secondary | ICD-10-CM | POA: Insufficient documentation

## 2022-10-15 DIAGNOSIS — F17210 Nicotine dependence, cigarettes, uncomplicated: Secondary | ICD-10-CM | POA: Diagnosis not present

## 2022-10-15 DIAGNOSIS — I10 Essential (primary) hypertension: Secondary | ICD-10-CM | POA: Diagnosis not present

## 2022-10-15 DIAGNOSIS — I4891 Unspecified atrial fibrillation: Secondary | ICD-10-CM | POA: Diagnosis not present

## 2022-10-15 DIAGNOSIS — I130 Hypertensive heart and chronic kidney disease with heart failure and stage 1 through stage 4 chronic kidney disease, or unspecified chronic kidney disease: Secondary | ICD-10-CM | POA: Insufficient documentation

## 2022-10-15 DIAGNOSIS — Z7901 Long term (current) use of anticoagulants: Secondary | ICD-10-CM | POA: Diagnosis not present

## 2022-10-15 DIAGNOSIS — Z720 Tobacco use: Secondary | ICD-10-CM | POA: Diagnosis not present

## 2022-10-15 DIAGNOSIS — F32A Depression, unspecified: Secondary | ICD-10-CM | POA: Insufficient documentation

## 2022-10-15 DIAGNOSIS — I480 Paroxysmal atrial fibrillation: Secondary | ICD-10-CM

## 2022-10-15 DIAGNOSIS — N183 Chronic kidney disease, stage 3 unspecified: Secondary | ICD-10-CM | POA: Diagnosis not present

## 2022-10-15 DIAGNOSIS — Z7984 Long term (current) use of oral hypoglycemic drugs: Secondary | ICD-10-CM | POA: Diagnosis not present

## 2022-10-15 DIAGNOSIS — E782 Mixed hyperlipidemia: Secondary | ICD-10-CM | POA: Insufficient documentation

## 2022-10-15 DIAGNOSIS — I5022 Chronic systolic (congestive) heart failure: Secondary | ICD-10-CM

## 2022-10-15 DIAGNOSIS — J449 Chronic obstructive pulmonary disease, unspecified: Secondary | ICD-10-CM | POA: Diagnosis not present

## 2022-10-15 NOTE — Patient Instructions (Signed)
Continue weighing daily and call for an overnight weight gain of 3 pounds or more or a weekly weight gain of more than 5 pounds. ? ? ?If you have voicemail, please make sure your mailbox is cleaned out so that we may leave a message and please make sure to listen to any voicemails.  ? ? ?

## 2022-10-15 NOTE — Progress Notes (Signed)
Patient ID: Dawn Sherman, female    DOB: 1956-04-28, 67 y.o.   MRN: [id number]   Dawn Sherman is a 67 y/o female with a history of HTN, CKD, hyperlipidemia, depression, current tobacco use and chronic heart failure.   Echo 09/28/22 showed EF of 35-40% with severe LAE/RAE and moderate MR. Echo report from 07/01/22 and showed an EF of 50-55% along with mild LAE and moderate MR. Echo report from 12/11/21 reviewed and showed an EF of 65-70% along with mild LVH/ LAE and mild MR. Echo report from 08/20/21 reviewed and showed EF of 50 to 55%. The left ventricle has low normal function. The left ventricle has no regional wall motion abnormalities. The left ventricular internal cavity size was mildly dilated. Echo report from 02/10/20 reviewed and showed an EF of 30-35% along with mildly elevated PA pressure, moderate MR and mild/moderate TR.   Stress test 10/14/22:   Findings are consistent with infarction with peri-infarct ischemia. The study is intermediate risk.   LV perfusion is abnormal. There is evidence of ischemia. There is evidence of infarction. Defect 1: There is a small defect with moderate reduction in uptake present in the apical apex location(s) that is partially reversible. There is abnormal wall motion in the defect area. Consistent with infarction with mild peri-infarct ischemia.   down sloping ST depression (II, III, aVF, V5 and V6) was noted.   Left ventricular function is abnormal. End diastolic cavity size is moderately enlarged.   Mildly reduced EF of 43 %   CT attenuation images with very mild aortic and coronary calcifications.  Admitted 09/23/22 due to SOB due to COPD/HF exacerbation. Started on 3L oxygen, diuresed and given IV steroids. CT chest showing moderate right pleural effusion. Right middle lobe and lower lobe atelectasis. Thoracentesis removed approximately 900cc fluid. Admitted 09/07/22 due to SOB due to HF/ COPD exacerbation. VQ scan without PE. Admitted 06/30/22 due to SOB due  to HF and AF. Cardiology consult obtained. Antibiotics given for possible COPD. Amiodarone started. CXR showed right pleural effusion. Initially given IV lasix with transition to oral diuretics. Weaned off oxygen to room air. VQ negative for PE. Placed on sodium bicarb due to renal function. Discharged after 5 days. Was in the ED 06/08/22 due to epistaxis and HTN where she was evaluated and released.   She presents today for a HF follow-up visit with a chief complaint of minimal SOB with moderate exertion. Describes this as chronic in nature. Has associated pedal edema and light-headedness with sudden position changes. Denies any difficulty sleeping, abdominal distention, palpitations, chest pain, wheezing, cough, fatigue or weight gain.   Started taking 39m torsemide BID yesterday due to increased edema. Normally takes torsemide 444mQAM. Has worn compression socks in the past but not recently.   She is unsure of her daily fluid intake. She drinks 3 glasses of water and decaf coffee but is unsure how many ounces the cup holds.   Past Medical History:  Diagnosis Date   B12 deficiency    [place] [place]   CKD (chronic kidney disease), stage III (HCC)    COPD (ch[facility] obstructive [place] disease) ([facility]   Depression    Endometriosis    Hypertension    Mixed hyperlipidemia    Tobacco abuse    Past Surgical History:  Procedure Laterality Date   ABDOMINAL HYSTERECTOMY     BREAST BIOPSY Right 2015   neg-  FIBROADENOMA   TAH/RSO  1999   secondary  to bleeding and endometriosis (Dr Vernie Ammons)   Family History  Problem Relation Age of Onset   Hypercholesterolemia Mother    Rheum arthritis Father    Rheum arthritis Daughter    Fibromyalgia Daughter    Breast cancer Neg Hx    Colon cancer Neg Hx    Social History   Tobacco Use   Smoking status: Former    Packs/day: 0.25    Years: 40.00    Total pack years: 10.00    Types: Cigarettes   Smokeless tobacco: Never    Tobacco comments:    Patient quit smoking recently   Substance Use Topics   Alcohol use: Not Currently    Alcohol/week: 0.0 standard drinks of alcohol    Comment: occasional   Allergies  Allergen Reactions   Sulfate Rash   Codeine Sulfate Nausea Only   Benicar [Olmesartan]     Talked with patient February 10, 2020, intolerance is unclear, tried several medications around that time and one of them gave her a rash but she is not clear which 1.   Amoxicillin Rash   Clindamycin/Lincomycin Rash   Entresto [Sacubitril-Valsartan] Other (See Comments)    hyperkalemia   Morphine And Related Rash   Penicillins Rash     Prior to Admission medications   Medication Sig Start Date End Date Taking? Authorizing Provider  acetaminophen (TYLENOL) 650 MG CR tablet Take 650 mg by mouth every 8 (eight) hours as needed for pain or fever.   Yes [provider]  albuterol (VENTOLIN HFA) 108 (90 Base) MCG/ACT inhaler Inhale 2 puffs into the lungs every 6 (six) hours as needed for wheezing or shortness of breath. 09/18/22  Yes [name], MD  amiodarone (PACERONE) 200 MG tablet Take 1 tablet (200 mg total) by mouth daily. 09/12/22  Yes Shawna Clamp, MD  apixaban (ELIQUIS) 2.5 MG TABS tablet Take 1 tablet (2.5 mg total) by mouth 2 (two) times daily. 04/08/22  Yes Minna Merritts, MD  atorvastatin (LIPITOR) 40 MG tablet Take 1 tablet (40 mg total) by mouth daily. 10/09/22  Yes [name], MD  buPROPion (WELLBUTRIN XL) 150 MG 24 hr tablet Take 1 tablet (150 mg total) by mouth daily. 10/09/22  Yes [name], MD  calcium carbonate (OS-CAL - DOSED IN MG OF ELEMENTAL CALCIUM) 1250 (500 Ca) MG tablet Take 1 tablet (500 mg of elemental calcium total) by mouth 3 (three) times daily with meals. 12/14/21  Yes Fritzi Mandes, MD  carvedilol (COREG) 12.5 MG tablet Take 1 tablet (12.5 mg total) by mouth 2 (two) times daily. 10/08/22  Yes Gollan, Kathlene November, MD  FARXIGA 10 MG TABS tablet Take 10 mg by mouth  daily. 11/20/21  Yes [provider]  Fluticasone-Umeclidin-Vilant (TRELEGY ELLIPTA) 100-62.5-25 MCG/ACT AEPB Inhale 1 puff into the lungs daily. 03/05/22  Yes [name], MD  loratadine (CLARITIN) 10 MG tablet Take 10 mg by mouth daily.   Yes [provider]  omeprazole (PRILOSEC) 20 MG capsule Take 20 mg by mouth daily.   Yes [provider]  torsemide (DEMADEX) 20 MG tablet Take 2 tablets (40 mg) by mouth once daily. You may take an extra 2 tablets (40 mg) in the afternoon as needed for leg swelling 10/08/22  Yes Gollan, Kathlene November, MD  venlafaxine XR (EFFEXOR-XR) 150 MG 24 hr capsule TAKE ONE CAPSULE BY MOUTH EVERY DAY 10/09/22  Yes [name], MD   Review of Systems  Constitutional:  Negative for appetite change and fatigue.  HENT:  Negative for congestion, postnasal drip and sore throat.   Eyes: Negative.   Respiratory:  Positive for shortness of breath. Negative for cough, chest tightness and wheezing.   Cardiovascular:  Positive for leg swelling (at times). Negative for chest pain and palpitations.  Gastrointestinal:  Negative for abdominal distention and abdominal pain.  Endocrine: Negative.   Genitourinary: Negative.   Musculoskeletal:  Negative for back pain and neck pain.  Skin: Negative.   Allergic/Immunologic: Negative.   Neurological:  Positive for light-headedness (with sudden position changes). Negative for dizziness.  Hematological:  Negative for adenopathy. Does not bruise/bleed easily.  Psychiatric/Behavioral:  Negative for dysphoric mood and sleep disturbance (sleeps on 1 pillow). The patient is not nervous/anxious.    Vitals:   10/15/22 1036  BP: 137/74  Pulse: 77  Resp: 18  SpO2: 94%  Weight: 128 lb 6 oz (58.2 kg)   Wt Readings from Last 3 Encounters:  10/15/22 128 lb 6 oz (58.2 kg)  10/09/22 122 lb 9.6 oz (55.6 kg)  10/08/22 121 lb 6 oz (55.1 kg)   Lab Results  Component Value Date   CREATININE 2.77 (H) 09/30/2022    CREATININE 2.66 (H) 09/29/2022   CREATININE 2.77 (H) 09/28/2022   Physical Exam Vitals and nursing note reviewed.  Constitutional:      General: She is not in acute distress.    Appearance: Normal appearance. She is not ill-appearing.  HENT:     Head: Normocephalic and atraumatic.  Cardiovascular:     Rate and Rhythm: Normal rate and regular rhythm.     Heart sounds: No murmur heard. Pulmonary:     Effort: Pulmonary effort is normal. No respiratory distress.     Breath sounds: No wheezing or rales.  Abdominal:     General: There is no distension.     Palpations: Abdomen is soft.     Tenderness: There is no abdominal tenderness.  Musculoskeletal:        General: No tenderness.     Cervical back: Normal range of motion and neck supple.     Right lower leg: Edema (trace pitting) present.     Left lower leg: No tenderness. Edema (trace pitting) present.  Skin:    General: Skin is warm and dry.  Neurological:     General: No focal deficit present.     Mental Status: She is alert and oriented to person, place, and time.  Psychiatric:        Mood and Affect: Mood normal.        Behavior: Behavior normal.        Thought Content: Thought content normal.    Assessment & Plan:  1: Chronic heart failure with mildly reduced ejection fraction- - NYHA class II - euvolemic today - weighing daily; reminded to call for an overnight weight gain of >2 pounds or a weekly weight gain of >5 pounds - weight stable from last visit here 3 months ago  - not adding salt to her food - carvedilol 12.49m BID - farxiga 112mdaily - torsemide 4037maily - not spiro candidate due to renal dysfunction - (had previous intolerance to entresto with hyperkalemia of 6.9 back in 2021) - has been taking torsemide 29m48mD just since yesterday due to increased edema; plans to decrease it back to 29mg62me daily once edema improves - stress test 10/14/22 showed LV perfusion is abnormal. There is evidence of  ischemia. There is evidence of infarction. Defect 1: There is a small defect with  moderate reduction in uptake present in the apical apex location(s) that is partially reversible. There is abnormal wall motion in the defect area. Consistent with infarction with mild peri-infarct ischemia. Down sloping ST depression (II, III, aVF, V5 and V6) was noted. Left ventricular function is abnormal. End diastolic cavity size is moderately enlarged - not a good candidate for cardiac CTA or cardiac catheterization given renal dysfunction with creatinine greater than 2  - unsure of daily fluid intake; drinks 3 glasses of water although doesn't know how many ounces the glass holds along with decaf coffee; emphasized the importance of knowing how many ounces the glass holds so she can keep daily fluid intake to 60-64 ounces - resume wearing compression socks daily with removal at bedtime; already elevating legs when sitting for long periods of time - BNP 09/27/22 was >4500.0 - PharmD reconciled meds w/ patient  2: HTN- - BP 137/74 - saw PCP [name]) 10/09/22  - BMP 09/30/22 reviewed and showed sodium 140, potassium 4.7, creatinine 2.77 and GFR 18 - has upcoming lab work with PCP so will not repeat BMP today - saw nephrology [name]) 11/20/21  3: Afib - saw cardiology Rockey Situ) 10/08/22 - amiodarone 219m daily - apixaban 2.529mBID  4: Tobacco use- - smoking 3 cigarettes/day - admits that she has no desire to quit smoking; has been smoking since the age of 67 complete cessation discussed for 3 minutes with her   Medication list reviewed.   Return in 2 months, sooner if needed.

## 2022-10-16 NOTE — Progress Notes (Signed)
Patient ID: Dawn Sherman, female   DOB: August 24, 1956, 67 y.o.   MRN: [id number] [place] - PHARMACIST COUNSELING NOTE  *HFpEF*  ACE/ARB/ARNI:  intolerant to Entresto - hyperkalemia, rash with olmesartan Beta Blocker:  Carvedilol 12.13m twice daily Aldosterone Antagonist:  n/a - Cdk and hx of hyperkalemia Diuretic: Torsemide 420mdaily plus 4057mRN (taking BID since last week) SGLT2i: Dapagliflozin 10 mg daily  Adherence Assessment  Do you ever forget to take your medication? []$ Yes [x]$ No  Do you ever skip doses due to side effects? []$ Yes [x]$ No  Do you have trouble affording your medicines? []$ Yes [x]$ No  Are you ever unable to pick up your medication due to transportation difficulties? []$ Yes [x]$ No  Do you ever stop taking your medications because you don't believe they are helping? []$ Yes [x]$ No  Do you check your weight daily? [x]$ Yes []$ No   Adherence strategy: pill organizer  Barriers to obtaining medications: Patient assistance for Farxiga and inhaler  Vital signs: HR 51, BP 145/72, weight (pounds) 123 lb (up 4lbs in 2 weeks) ECHO: Date 12/11/2021, EF 65-70%, notes  mild LVH/ LAE and mild MR     Latest Ref Rng & Units 09/30/2022    5:05 AM 09/29/2022    5:37 AM 09/28/2022    4:47 AM  BMP  Glucose 70 - 99 mg/dL 123  145  111   BUN 8 - 23 mg/dL 92  98  92   Creatinine 0.44 - 1.00 mg/dL 2.77  2.66  2.77   Sodium 135 - 145 mmol/L 140  140  138   Potassium 3.5 - 5.1 mmol/L 4.7  5.0  5.1   Chloride 98 - 111 mmol/L 101  104  103   CO2 22 - 32 mmol/L 26  27  27   $ Calcium 8.9 - 10.3 mg/dL 9.1  9.3  9.6     Past Medical History:  Diagnosis Date   B12 deficiency    CHF (congestive heart failure) (HCC)    CKD (chronic kidney disease), stage III (HCC)    [place] [place] disease) [place]  Depression    Endometriosis    Hypertension    Mixed hyperlipidemia    Tobacco abuse     ASSESSMENT 65 8ar old female who  presents to the HF clinic for follow up. PMH includes  HTN, CKD, hyperlipidemia, depression, current tobacco use,and chronic HF. Note increase weight but patient reports stable weight at home and denies swelling, shortness or breath, fall, or dizziness. Admits to smoking 5-6 cigarettes per day and had NO interest on smoking cessation. Her last hospital admission was January/19 for shortness of breath and pneumonia. Noted last   Medication reconciliation completed complete during appointment. Noted changes in therapy per cardiology (Dr GolRockey Situnly 1 week ago. Patient is taking her torsemide BID for the last week.   PLAN (recommendation) Intolerant to olmesartan dn Entresto. No true allergy, can re-challenge with valsartan or losartan during next OV. Repeat BMP today due to increased diuretics usage and CKD-3b Not appropriate for MRA therapy , K fluctuates between  4.7-5.4, and Scr 2.6-2.7 Follow up per NP instructions  Time spent: 15 minutes Piya Mesch Rodriguez-Guzman PharmD, BCPS 10/16/2022 9:17 AM    Current Outpatient Medications:    acetaminophen (TYLENOL) 650 MG CR tablet, Take 650 mg by mouth every 8 (eight) hours as needed for pain or fever., Disp: , Rfl:    albuterol (VENTOLIN HFA) 108 (90 Base) MCG/ACT inhaler,  Inhale 2 puffs into the lungs every 6 (six) hours as needed for wheezing or shortness of breath., Disp: 8 g, Rfl: 2   amiodarone (PACERONE) 200 MG tablet, Take 1 tablet (200 mg total) by mouth daily., Disp: 30 tablet, Rfl: 1   apixaban (ELIQUIS) 2.5 MG TABS tablet, Take 1 tablet (2.5 mg total) by mouth 2 (two) times daily., Disp: 60 tablet, Rfl: 6   atorvastatin (LIPITOR) 40 MG tablet, Take 1 tablet (40 mg total) by mouth daily., Disp: 90 tablet, Rfl: 1   buPROPion (WELLBUTRIN XL) 150 MG 24 hr tablet, Take 1 tablet (150 mg total) by mouth daily., Disp: 90 tablet, Rfl: 1   calcium carbonate (OS-CAL - DOSED IN MG OF ELEMENTAL CALCIUM) 1250 (500 Ca) MG tablet, Take 1 tablet (500 mg of  elemental calcium total) by mouth 3 (three) times daily with meals., Disp: 60 tablet, Rfl: 1   carvedilol (COREG) 12.5 MG tablet, Take 1 tablet (12.5 mg total) by mouth 2 (two) times daily., Disp: 180 tablet, Rfl: 3   FARXIGA 10 MG TABS tablet, Take 10 mg by mouth daily., Disp: , Rfl:    Fluticasone-Umeclidin-Vilant (TRELEGY ELLIPTA) 100-62.5-25 MCG/ACT AEPB, Inhale 1 puff into the lungs daily., Disp: 3 each, Rfl: 3   loratadine (CLARITIN) 10 MG tablet, Take 10 mg by mouth daily., Disp: , Rfl:    omeprazole (PRILOSEC) 20 MG capsule, Take 20 mg by mouth daily., Disp: , Rfl:    torsemide (DEMADEX) 20 MG tablet, Take 2 tablets (40 mg) by mouth once daily. You may take an extra 2 tablets (40 mg) in the afternoon as needed for leg swelling, Disp: 270 tablet, Rfl: 3   venlafaxine XR (EFFEXOR-XR) 150 MG 24 hr capsule, TAKE ONE CAPSULE BY MOUTH EVERY DAY, Disp: 90 capsule, Rfl: 1   MEDICATION ADHERENCES TIPS AND STRATEGIES Taking medication as prescribed improves patient outcomes in heart failure (reduces hospitalizations, improves symptoms, increases survival) Side effects of medications can be managed by decreasing doses, switching agents, stopping drugs, or adding additional therapy. Please let someone in the [facility] know if you have having bothersome side effects so we can modify your regimen. Do not alter your medication regimen without talking to [place].  Medication reminders can help patients remember to take drugs on time. If you are missing or forgetting doses you can try linking behaviors, using pill boxes, or an electronic reminder like an alarm on your phone or an app. Some people can also get automated phone calls as medication reminders.

## 2022-10-21 ENCOUNTER — Telehealth: Payer: Self-pay | Admitting: Cardiovascular Disease

## 2022-10-21 NOTE — Telephone Encounter (Signed)
Patient made aware of results and verbalized understanding.   Minna Merritts, MD 10/19/2022  8:30 PM EST     Stress test I reviewed the images, predominantly fixed defect in the mid to distal anterior wall, apical region consistent with prior MI Moderate-sized pleural effusion on the right We will hold off on catheterization given predominantly fixed defect Would continue to work on getting the fluid off which should help the pleural effusion   Patient has a follow up on 3/19.

## 2022-10-21 NOTE — Telephone Encounter (Signed)
Follow Up:      Patient is calling to find out her Stress Test results from 10-14-22. She said she does not have access to My Chart

## 2022-11-04 NOTE — Progress Notes (Unsigned)
Patient ID: Dawn Sherman, female    DOB: 1955/10/08, 67 y.o.   MRN: [id number]   Dawn Sherman is a 67 y/o female with a history of HTN, CKD, hyperlipidemia, depression, current tobacco use and chronic heart failure.   Echo 09/28/22: EF of 35-40% with severe LAE/RAE and moderate MR. Echo 07/01/22: EF of 50-55% along with mild LAE and moderate MR. Echo report from 12/11/21 reviewed and showed an EF of 65-70% along with mild LVH/ LAE and mild MR. Echo report from 08/20/21 reviewed and showed EF of 50 to 55%. The left ventricle has low normal function. The left ventricle has no regional wall motion abnormalities. The left ventricular internal cavity size was mildly dilated.    Stress test 10/14/22:   Findings are consistent with infarction with peri-infarct ischemia. The study is intermediate risk.   LV perfusion is abnormal. There is evidence of ischemia. There is evidence of infarction. Defect 1: There is a small defect with moderate reduction in uptake present in the apical apex location(s) that is partially reversible. There is abnormal wall motion in the defect area. Consistent with infarction with mild peri-infarct ischemia.   down sloping ST depression (II, III, aVF, V5 and V6) was noted.   Left ventricular function is abnormal. End diastolic cavity size is moderately enlarged.   Mildly reduced EF of 43 %   CT attenuation images with very mild aortic and coronary calcifications.  Admitted 09/23/22 due to SOB due to COPD/HF exacerbation. Started on 3L oxygen, diuresed and given IV steroids. CT chest showing moderate right pleural effusion. Right middle lobe and lower lobe atelectasis. Thoracentesis removed approximately 900cc fluid. Admitted 09/07/22 due to SOB due to HF/ COPD exacerbation. VQ scan without PE. Admitted 06/30/22 due to SOB due to HF and AF. Cardiology consult obtained. Antibiotics given for possible COPD. Amiodarone started. CXR showed right pleural effusion. Initially given IV lasix with  transition to oral diuretics. Weaned off oxygen to room air. VQ negative for PE. Placed on sodium bicarb due to renal function. Discharged after 5 days. Was in the ED 06/08/22 due to epistaxis and HTN where she was evaluated and released.   She presents today for a HF follow-up visit with a chief complaint of minimal SOB with moderate exertion. Chronic in nature. Has minimal pedal edema and light-headedness with sudden position changes such as bending over at the waist or turning her head too quickly. Denies any difficulty sleeping, abdominal distention, palpitations, chest pain, wheezing, cough, fatigue or weight gain.   Says that most days, she takes torsemide '40mg'$  BID to keep the pedal edema under control. Does wear compression socks daily  Past Medical History:  Diagnosis Date   B12 deficiency    CHF (congestive heart failure) (HCC)    CKD (chronic kidney disease), stage III (HCC)    COPD (chronic obstructive pulmonary disease) ([place])    Depression    Endometriosis    Hypertension    Mixed hyperlipidemia    Tobacco abuse    Past Surgical History:  Procedure Laterality Date   ABDOMINAL HYSTERECTOMY     BREAST BIOPSY Right 2015   neg-  FIBROADENOMA   TAH/RSO  1999   secondary to bleeding and endometriosis (Dr Vernie Ammons)   Family History  Problem Relation Age of Onset   Hypercholesterolemia Mother    Rheum arthritis Father    Rheum arthritis Daughter    Fibromyalgia Daughter    Breast cancer Neg Hx    Colon cancer Neg  Hx    Social History   Tobacco Use   Smoking status: Former    Packs/day: 0.25    Years: 40.00    Total pack years: 10.00    Types: Cigarettes   Smokeless tobacco: Never   Tobacco comments:    Patient quit smoking recently   Substance Use Topics   Alcohol use: Not Currently    Alcohol/week: 0.0 standard drinks of alcohol    Comment: occasional   Allergies  Allergen Reactions   Sulfate Rash   Codeine Sulfate Nausea Only   Benicar [Olmesartan]      Talked with patient February 10, 2020, intolerance is unclear, tried several medications around that time and one of them gave her a rash but she is not clear which 1.   Amoxicillin Rash   Clindamycin/Lincomycin Rash   Entresto [Sacubitril-Valsartan] Other (See Comments)    hyperkalemia   Morphine And Related Rash   Penicillins Rash     Prior to Admission medications   Medication Sig Start Date End Date Taking? Authorizing Provider  acetaminophen (TYLENOL) 650 MG CR tablet Take 650 mg by mouth every 8 (eight) hours as needed for pain or fever.   Yes [provider]  albuterol (VENTOLIN HFA) 108 (90 Base) MCG/ACT inhaler Inhale 2 puffs into the lungs every 6 (six) hours as needed for wheezing or shortness of breath. 09/18/22  Yes [name], MD  amiodarone (PACERONE) 200 MG tablet Take 1 tablet (200 mg total) by mouth daily. 09/12/22  Yes Duard Brady, MD  apixaban (ELIQUIS) 2.5 MG TABS tablet Take 1 tablet (2.5 mg total) by mouth 2 (two) times daily. 04/08/22  Yes Minna Merritts, MD  atorvastatin (LIPITOR) 40 MG tablet Take 1 tablet (40 mg total) by mouth daily. 10/09/22  Yes [name], MD  buPROPion (WELLBUTRIN XL) 150 MG 24 hr tablet Take 1 tablet (150 mg total) by mouth daily. 10/09/22  Yes [name], MD  calcium carbonate (OS-CAL - DOSED IN MG OF ELEMENTAL CALCIUM) 1250 (500 Ca) MG tablet Take 1 tablet (500 mg of elemental calcium total) by mouth 3 (three) times daily with meals. 12/14/21  Yes Fritzi Mandes, MD  carvedilol (COREG) 12.5 MG tablet Take 1 tablet (12.5 mg total) by mouth 2 (two) times daily. 10/08/22  Yes Gollan, Kathlene November, MD  FARXIGA 10 MG TABS tablet Take 10 mg by mouth daily. 11/20/21  Yes [provider]  Fluticasone-Umeclidin-Vilant (TRELEGY ELLIPTA) 100-62.5-25 MCG/ACT AEPB Inhale 1 puff into the lungs daily. 03/05/22  Yes [name], MD  loratadine (CLARITIN) 10 MG tablet Take 10 mg by mouth daily.   Yes [provider]   omeprazole (PRILOSEC) 20 MG capsule Take 20 mg by mouth daily.   Yes [provider]  torsemide (DEMADEX) 20 MG tablet Take 2 tablets (40 mg) by mouth once daily. You may take an extra 2 tablets (40 mg) in the afternoon as needed for leg swelling 10/08/22  Yes Gollan, Kathlene November, MD  venlafaxine XR (EFFEXOR-XR) 150 MG 24 hr capsule TAKE ONE CAPSULE BY MOUTH EVERY DAY 10/09/22  Yes [name], MD     Review of Systems  Constitutional:  Negative for appetite change and fatigue.  HENT:  Negative for congestion, postnasal drip and sore throat.   Eyes: Negative.   Respiratory:  Positive for shortness of breath. Negative for cough, chest tightness and wheezing.   Cardiovascular:  Positive for leg swelling (at times). Negative for chest pain and palpitations.  Gastrointestinal:  Negative for abdominal distention and abdominal pain.  Endocrine: Negative.   Genitourinary: Negative.   Musculoskeletal:  Negative for back pain and neck pain.  Skin: Negative.   Allergic/Immunologic: Negative.   Neurological:  Positive for light-headedness (with sudden position changes). Negative for dizziness.  Hematological:  Negative for adenopathy. Does not bruise/bleed easily.  Psychiatric/Behavioral:  Negative for dysphoric mood and sleep disturbance (sleeps on 1 pillow). The patient is not nervous/anxious.    Vitals:   11/05/22 0827  BP: (!) 116/55  Pulse: 68  Resp: 14  SpO2: 99%  Weight: 128 lb 8 oz (58.3 kg)   Wt Readings from Last 3 Encounters:  11/05/22 128 lb 8 oz (58.3 kg)  10/15/22 128 lb 6 oz (58.2 kg)  10/09/22 122 lb 9.6 oz (55.6 kg)   Lab Results  Component Value Date   CREATININE 2.77 (H) 09/30/2022   CREATININE 2.66 (H) 09/29/2022   CREATININE 2.77 (H) 09/28/2022   Physical Exam Vitals and nursing note reviewed.  Constitutional:      General: She is not in acute distress.    Appearance: Normal appearance. She is not ill-appearing.  HENT:     Head: Normocephalic and  atraumatic.  Cardiovascular:     Rate and Rhythm: Normal rate and regular rhythm.     Heart sounds: No murmur heard. Pulmonary:     Effort: Pulmonary effort is normal. No respiratory distress.     Breath sounds: No wheezing or rales.  Abdominal:     General: There is no distension.     Palpations: Abdomen is soft.     Tenderness: There is no abdominal tenderness.  Musculoskeletal:        General: No tenderness.     Cervical back: Normal range of motion and neck supple.     Right lower leg: Edema (trace pitting) present.     Left lower leg: No tenderness. Edema (trace pitting) present.  Skin:    General: Skin is warm and dry.  Neurological:     General: No focal deficit present.     Mental Status: She is alert and oriented to person, place, and time.  Psychiatric:        Mood and Affect: Mood normal.        Behavior: Behavior normal.        Thought Content: Thought content normal.    Assessment & Plan:  1: Chronic heart failure with mildly reduced ejection fraction- - NYHA class II - euvolemic today - weighing daily; reminded to call for an overnight weight gain of >2 pounds or a weekly weight gain of >5 pounds - weight stable from last visit here 3 weeks ago  - echo 09/28/22: EF of 35-40% with severe LAE/RAE and moderate MR. Echo 07/01/22: EF of 50-55% along with mild LAE and moderate MR.  - stress test 10/14/22 showed LV perfusion is abnormal. There is evidence of ischemia. There is evidence of infarction. Defect 1: There is a small defect with moderate reduction in uptake present in the apical apex location(s) that is partially reversible. There is abnormal wall motion in the defect area. Consistent with infarction with mild peri-infarct ischemia. Down sloping ST depression (II, III, aVF, V5 and V6) was noted. Left ventricular function is abnormal. End diastolic cavity size is moderately enlarged - not a good candidate for cardiac CTA or cardiac catheterization given renal  dysfunction with creatinine greater than 2  - not adding salt to her food - carvedilol 12.'5mg'$  BID - farxiga  $'10mg'i$  daily - reports taking torsemide '40mg'$  BID most days due to keeping edema under control - BMP today - not spiro candidate due to renal dysfunction - (had previous intolerance to entresto with hyperkalemia of 6.9 back in 2021) - wearing compression socks daily - BNP 09/27/22 was >4500.0  2: HTN- - BP 116/55 - saw PCP [name]) 10/09/22  - BMP 09/30/22 reviewed and showed sodium 140, potassium 4.7, creatinine 2.77 and GFR 18 - saw nephrology [name]) 11/20/21  3: Afib - saw cardiology Rockey Situ) 10/08/22 - amiodarone '200mg'$  daily - apixaban 2.'5mg'$  BID  4: Tobacco use- - stopped smoking ~ 1 month ago - congratulated on that and continued cessation encouraged   Medication list reviewed.   Return in 3 months, sooner if needed.

## 2022-11-05 ENCOUNTER — Ambulatory Visit (HOSPITAL_BASED_OUTPATIENT_CLINIC_OR_DEPARTMENT_OTHER): Payer: Medicare Other | Admitting: Family

## 2022-11-05 ENCOUNTER — Encounter: Payer: Self-pay | Admitting: Family

## 2022-11-05 ENCOUNTER — Other Ambulatory Visit
Admission: RE | Admit: 2022-11-05 | Discharge: 2022-11-05 | Disposition: A | Discharge status: Home / Self Care | Payer: Medicare Other | Source: Ambulatory Visit | Attending: Family | Admitting: Family

## 2022-11-05 VITALS — BP 116/55 | HR 68 | Resp 14 | Wt 128.5 lb

## 2022-11-05 DIAGNOSIS — I5022 Chronic systolic (congestive) heart failure: Secondary | ICD-10-CM | POA: Diagnosis present

## 2022-11-05 DIAGNOSIS — I480 Paroxysmal atrial fibrillation: Secondary | ICD-10-CM | POA: Diagnosis not present

## 2022-11-05 DIAGNOSIS — Z720 Tobacco use: Secondary | ICD-10-CM

## 2022-11-05 DIAGNOSIS — I10 Essential (primary) hypertension: Secondary | ICD-10-CM

## 2022-11-05 LAB — BASIC METABOLIC PANEL
Anion gap: 10 (ref 5–15)
BUN: 35 mg/dL — ABNORMAL HIGH (ref 8–23)
CO2: 25 mmol/L (ref 22–32)
Calcium: 8.6 mg/dL — ABNORMAL LOW (ref 8.9–10.3)
Chloride: 101 mmol/L (ref 98–111)
Creatinine, Ser: 2.68 mg/dL — ABNORMAL HIGH (ref 0.44–1.00)
GFR, Estimated: 19 mL/min — ABNORMAL LOW (ref >60)
Glucose, Bld: 96 mg/dL (ref 70–99)
Potassium: 5.1 mmol/L (ref 3.5–5.1)
Sodium: 136 mmol/L (ref 135–145)

## 2022-11-12 ENCOUNTER — Encounter: Payer: Self-pay | Admitting: Emergency Medicine

## 2022-11-12 ENCOUNTER — Inpatient Hospital Stay: Payer: Medicare Other

## 2022-11-12 ENCOUNTER — Inpatient Hospital Stay
Admission: EM | Admit: 2022-11-12 | Discharge: 2022-11-26 | DRG: 286 | Disposition: A | Discharge status: Home Health | Payer: Medicare Other | Source: Ambulatory Visit | Attending: Internal Medicine | Admitting: Internal Medicine

## 2022-11-12 ENCOUNTER — Other Ambulatory Visit
Admission: RE | Admit: 2022-11-12 | Discharge: 2022-11-12 | Disposition: A | Discharge status: Home / Self Care | Payer: Medicare Other | Source: Ambulatory Visit | Attending: Medical | Admitting: Medical

## 2022-11-12 ENCOUNTER — Encounter: Payer: Self-pay | Admitting: Medical

## 2022-11-12 ENCOUNTER — Ambulatory Visit
Admission: RE | Admit: 2022-11-12 | Discharge: 2022-11-12 | Disposition: A | Discharge status: Home / Self Care | Payer: Medicare Other | Source: Ambulatory Visit | Attending: Medical | Admitting: Medical

## 2022-11-12 ENCOUNTER — Ambulatory Visit: Payer: Medicare Other | Attending: Medical | Admitting: Medical

## 2022-11-12 ENCOUNTER — Emergency Department: Payer: Medicare Other

## 2022-11-12 ENCOUNTER — Other Ambulatory Visit: Payer: Self-pay

## 2022-11-12 VITALS — BP 116/66 | HR 66 | Ht 65.0 in | Wt 132.8 lb

## 2022-11-12 DIAGNOSIS — Y848 Other medical procedures as the cause of abnormal reaction of the patient, or of later complication, without mention of misadventure at the time of the procedure: Secondary | ICD-10-CM | POA: Diagnosis not present

## 2022-11-12 DIAGNOSIS — E1122 Type 2 diabetes mellitus with diabetic chronic kidney disease: Secondary | ICD-10-CM | POA: Diagnosis present

## 2022-11-12 DIAGNOSIS — I130 Hypertensive heart and chronic kidney disease with heart failure and stage 1 through stage 4 chronic kidney disease, or unspecified chronic kidney disease: Secondary | ICD-10-CM | POA: Diagnosis present

## 2022-11-12 DIAGNOSIS — R0600 Dyspnea, unspecified: Secondary | ICD-10-CM | POA: Diagnosis not present

## 2022-11-12 DIAGNOSIS — Z5309 Procedure and treatment not carried out because of other contraindication: Secondary | ICD-10-CM | POA: Diagnosis present

## 2022-11-12 DIAGNOSIS — J449 Chronic obstructive pulmonary disease, unspecified: Secondary | ICD-10-CM | POA: Diagnosis present

## 2022-11-12 DIAGNOSIS — K640 First degree hemorrhoids: Secondary | ICD-10-CM | POA: Diagnosis present

## 2022-11-12 DIAGNOSIS — Z79899 Other long term (current) drug therapy: Secondary | ICD-10-CM

## 2022-11-12 DIAGNOSIS — R570 Cardiogenic shock: Secondary | ICD-10-CM | POA: Diagnosis not present

## 2022-11-12 DIAGNOSIS — D649 Anemia, unspecified: Secondary | ICD-10-CM | POA: Diagnosis not present

## 2022-11-12 DIAGNOSIS — R0609 Other forms of dyspnea: Secondary | ICD-10-CM

## 2022-11-12 DIAGNOSIS — Z888 Allergy status to other drugs, medicaments and biological substances status: Secondary | ICD-10-CM

## 2022-11-12 DIAGNOSIS — I272 Pulmonary hypertension, unspecified: Secondary | ICD-10-CM | POA: Diagnosis present

## 2022-11-12 DIAGNOSIS — I5033 Acute on chronic diastolic (congestive) heart failure: Secondary | ICD-10-CM | POA: Diagnosis not present

## 2022-11-12 DIAGNOSIS — I5030 Unspecified diastolic (congestive) heart failure: Secondary | ICD-10-CM | POA: Diagnosis present

## 2022-11-12 DIAGNOSIS — I081 Rheumatic disorders of both mitral and tricuspid valves: Secondary | ICD-10-CM | POA: Diagnosis present

## 2022-11-12 DIAGNOSIS — E782 Mixed hyperlipidemia: Secondary | ICD-10-CM | POA: Diagnosis present

## 2022-11-12 DIAGNOSIS — Z7901 Long term (current) use of anticoagulants: Secondary | ICD-10-CM | POA: Diagnosis not present

## 2022-11-12 DIAGNOSIS — E875 Hyperkalemia: Secondary | ICD-10-CM | POA: Diagnosis present

## 2022-11-12 DIAGNOSIS — Z881 Allergy status to other antibiotic agents status: Secondary | ICD-10-CM

## 2022-11-12 DIAGNOSIS — Z7984 Long term (current) use of oral hypoglycemic drugs: Secondary | ICD-10-CM

## 2022-11-12 DIAGNOSIS — Z720 Tobacco use: Secondary | ICD-10-CM | POA: Diagnosis present

## 2022-11-12 DIAGNOSIS — E785 Hyperlipidemia, unspecified: Secondary | ICD-10-CM | POA: Diagnosis present

## 2022-11-12 DIAGNOSIS — T8089XA Other complications following infusion, transfusion and therapeutic injection, initial encounter: Secondary | ICD-10-CM | POA: Diagnosis not present

## 2022-11-12 DIAGNOSIS — J9601 Acute respiratory failure with hypoxia: Secondary | ICD-10-CM | POA: Diagnosis present

## 2022-11-12 DIAGNOSIS — I340 Nonrheumatic mitral (valve) insufficiency: Secondary | ICD-10-CM

## 2022-11-12 DIAGNOSIS — K219 Gastro-esophageal reflux disease without esophagitis: Secondary | ICD-10-CM | POA: Diagnosis present

## 2022-11-12 DIAGNOSIS — R0602 Shortness of breath: Secondary | ICD-10-CM | POA: Diagnosis present

## 2022-11-12 DIAGNOSIS — N184 Chronic kidney disease, stage 4 (severe): Secondary | ICD-10-CM | POA: Diagnosis present

## 2022-11-12 DIAGNOSIS — I10 Essential (primary) hypertension: Secondary | ICD-10-CM

## 2022-11-12 DIAGNOSIS — I5043 Acute on chronic combined systolic (congestive) and diastolic (congestive) heart failure: Secondary | ICD-10-CM | POA: Diagnosis present

## 2022-11-12 DIAGNOSIS — Z9981 Dependence on supplemental oxygen: Secondary | ICD-10-CM

## 2022-11-12 DIAGNOSIS — J440 Chronic obstructive pulmonary disease with acute lower respiratory infection: Secondary | ICD-10-CM | POA: Diagnosis present

## 2022-11-12 DIAGNOSIS — D509 Iron deficiency anemia, unspecified: Secondary | ICD-10-CM | POA: Diagnosis present

## 2022-11-12 DIAGNOSIS — Z8261 Family history of arthritis: Secondary | ICD-10-CM

## 2022-11-12 DIAGNOSIS — Z885 Allergy status to narcotic agent status: Secondary | ICD-10-CM

## 2022-11-12 DIAGNOSIS — Z87820 Personal history of traumatic brain injury: Secondary | ICD-10-CM

## 2022-11-12 DIAGNOSIS — N170 Acute kidney failure with tubular necrosis: Secondary | ICD-10-CM | POA: Diagnosis not present

## 2022-11-12 DIAGNOSIS — K5900 Constipation, unspecified: Secondary | ICD-10-CM | POA: Diagnosis present

## 2022-11-12 DIAGNOSIS — E039 Hypothyroidism, unspecified: Secondary | ICD-10-CM | POA: Diagnosis present

## 2022-11-12 DIAGNOSIS — R809 Proteinuria, unspecified: Secondary | ICD-10-CM | POA: Diagnosis present

## 2022-11-12 DIAGNOSIS — R0902 Hypoxemia: Secondary | ICD-10-CM

## 2022-11-12 DIAGNOSIS — N2581 Secondary hyperparathyroidism of renal origin: Secondary | ICD-10-CM | POA: Diagnosis present

## 2022-11-12 DIAGNOSIS — I420 Dilated cardiomyopathy: Secondary | ICD-10-CM | POA: Diagnosis present

## 2022-11-12 DIAGNOSIS — N809 Endometriosis, unspecified: Secondary | ICD-10-CM | POA: Diagnosis present

## 2022-11-12 DIAGNOSIS — D631 Anemia in chronic kidney disease: Secondary | ICD-10-CM | POA: Diagnosis present

## 2022-11-12 DIAGNOSIS — D508 Other iron deficiency anemias: Secondary | ICD-10-CM | POA: Diagnosis not present

## 2022-11-12 DIAGNOSIS — R0682 Tachypnea, not elsewhere classified: Secondary | ICD-10-CM | POA: Diagnosis not present

## 2022-11-12 DIAGNOSIS — F320 Major depressive disorder, single episode, mild: Secondary | ICD-10-CM | POA: Diagnosis present

## 2022-11-12 DIAGNOSIS — Z880 Allergy status to penicillin: Secondary | ICD-10-CM

## 2022-11-12 DIAGNOSIS — I5022 Chronic systolic (congestive) heart failure: Secondary | ICD-10-CM

## 2022-11-12 DIAGNOSIS — I4819 Other persistent atrial fibrillation: Secondary | ICD-10-CM | POA: Diagnosis present

## 2022-11-12 DIAGNOSIS — Z7951 Long term (current) use of inhaled steroids: Secondary | ICD-10-CM

## 2022-11-12 DIAGNOSIS — J441 Chronic obstructive pulmonary disease with (acute) exacerbation: Secondary | ICD-10-CM | POA: Diagnosis not present

## 2022-11-12 DIAGNOSIS — I4892 Unspecified atrial flutter: Secondary | ICD-10-CM | POA: Diagnosis present

## 2022-11-12 DIAGNOSIS — Z90710 Acquired absence of both cervix and uterus: Secondary | ICD-10-CM

## 2022-11-12 DIAGNOSIS — Z8701 Personal history of pneumonia (recurrent): Secondary | ICD-10-CM

## 2022-11-12 DIAGNOSIS — Z83438 Family history of other disorder of lipoprotein metabolism and other lipidemia: Secondary | ICD-10-CM

## 2022-11-12 DIAGNOSIS — Z882 Allergy status to sulfonamides status: Secondary | ICD-10-CM

## 2022-11-12 DIAGNOSIS — I480 Paroxysmal atrial fibrillation: Secondary | ICD-10-CM

## 2022-11-12 DIAGNOSIS — Y838 Other surgical procedures as the cause of abnormal reaction of the patient, or of later complication, without mention of misadventure at the time of the procedure: Secondary | ICD-10-CM | POA: Diagnosis not present

## 2022-11-12 DIAGNOSIS — Z87891 Personal history of nicotine dependence: Secondary | ICD-10-CM

## 2022-11-12 DIAGNOSIS — I428 Other cardiomyopathies: Secondary | ICD-10-CM | POA: Diagnosis not present

## 2022-11-12 HISTORY — DX: Anemia, unspecified: D64.9

## 2022-11-12 HISTORY — DX: Chronic systolic (congestive) heart failure: I50.22

## 2022-11-12 HISTORY — DX: Cardiomyopathy, unspecified: I42.9

## 2022-11-12 HISTORY — DX: Unspecified atrial flutter: I48.92

## 2022-11-12 HISTORY — DX: Nonrheumatic mitral (valve) insufficiency: I34.0

## 2022-11-12 HISTORY — DX: Paroxysmal atrial fibrillation: I48.0

## 2022-11-12 HISTORY — DX: Chronic kidney disease, stage 4 (severe): N18.4

## 2022-11-12 LAB — CBC
HCT: 23.9 % — ABNORMAL LOW (ref 36.0–46.0)
HCT: 26 % — ABNORMAL LOW (ref 36.0–46.0)
Hemoglobin: 7.1 g/dL — ABNORMAL LOW (ref 12.0–15.0)
Hemoglobin: 7.7 g/dL — ABNORMAL LOW (ref 12.0–15.0)
MCH: 24.5 pg — ABNORMAL LOW (ref 26.0–34.0)
MCH: 24.4 pg — ABNORMAL LOW (ref 26.0–34.0)
MCHC: 29.7 g/dL — ABNORMAL LOW (ref 30.0–36.0)
MCHC: 29.6 g/dL — ABNORMAL LOW (ref 30.0–36.0)
MCV: 82.4 fL (ref 80.0–100.0)
MCV: 82.3 fL (ref 80.0–100.0)
Platelets: 295 10*3/uL (ref 150–400)
Platelets: 308 10*3/uL (ref 150–400)
RBC: 2.9 MIL/uL — ABNORMAL LOW (ref 3.87–5.11)
RBC: 3.16 MIL/uL — ABNORMAL LOW (ref 3.87–5.11)
RDW: 17.2 % — ABNORMAL HIGH (ref 11.5–15.5)
RDW: 17.1 % — ABNORMAL HIGH (ref 11.5–15.5)
WBC: 7.5 10*3/uL (ref 4.0–10.5)
WBC: 8.1 10*3/uL (ref 4.0–10.5)
nRBC: 0 % (ref 0.0–0.2)
nRBC: 0 % (ref 0.0–0.2)

## 2022-11-12 LAB — BASIC METABOLIC PANEL
Anion gap: 8 (ref 5–15)
Anion gap: 10 (ref 5–15)
BUN: 42 mg/dL — ABNORMAL HIGH (ref 8–23)
BUN: 40 mg/dL — ABNORMAL HIGH (ref 8–23)
CO2: 27 mmol/L (ref 22–32)
CO2: 25 mmol/L (ref 22–32)
Calcium: 8.9 mg/dL (ref 8.9–10.3)
Calcium: 8.7 mg/dL — ABNORMAL LOW (ref 8.9–10.3)
Chloride: 101 mmol/L (ref 98–111)
Chloride: 99 mmol/L (ref 98–111)
Creatinine, Ser: 2.76 mg/dL — ABNORMAL HIGH (ref 0.44–1.00)
Creatinine, Ser: 2.71 mg/dL — ABNORMAL HIGH (ref 0.44–1.00)
GFR, Estimated: 18 mL/min — ABNORMAL LOW (ref >60)
GFR, Estimated: 19 mL/min — ABNORMAL LOW (ref >60)
Glucose, Bld: 77 mg/dL (ref 70–99)
Glucose, Bld: 141 mg/dL — ABNORMAL HIGH (ref 70–99)
Potassium: 5.5 mmol/L — ABNORMAL HIGH (ref 3.5–5.1)
Potassium: 5.2 mmol/L — ABNORMAL HIGH (ref 3.5–5.1)
Sodium: 136 mmol/L (ref 135–145)
Sodium: 134 mmol/L — ABNORMAL LOW (ref 135–145)

## 2022-11-12 LAB — IRON AND TIBC
Iron: 17 ug/dL — ABNORMAL LOW (ref 28–170)
Saturation Ratios: 4 % — ABNORMAL LOW (ref 10.4–31.8)
TIBC: 428 ug/dL (ref 250–450)
UIBC: 411 ug/dL

## 2022-11-12 LAB — FERRITIN: Ferritin: 18 ng/mL (ref 11–307)

## 2022-11-12 LAB — PROCALCITONIN: Procalcitonin: <0.1 ng/mL

## 2022-11-12 LAB — PREPARE RBC (CROSSMATCH)

## 2022-11-12 LAB — BRAIN NATRIURETIC PEPTIDE
B Natriuretic Peptide: 3619.9 pg/mL — ABNORMAL HIGH (ref 0.0–100.0)
B Natriuretic Peptide: 3247 pg/mL — ABNORMAL HIGH (ref 0.0–100.0)

## 2022-11-12 LAB — RETICULOCYTES
Immature Retic Fract: 27.5 % — ABNORMAL HIGH (ref 2.3–15.9)
RBC.: 3.01 MIL/uL — ABNORMAL LOW (ref 3.87–5.11)
Retic Count, Absolute: 51.2 10*3/uL (ref 19.0–186.0)
Retic Ct Pct: 1.7 % (ref 0.4–3.1)

## 2022-11-12 LAB — FOLATE: Folate: 16.3 ng/mL (ref >5.9)

## 2022-11-12 LAB — TROPONIN I (HIGH SENSITIVITY)
Troponin I (High Sensitivity): 10 ng/L (ref <18)
Troponin I (High Sensitivity): 9 ng/L (ref <18)

## 2022-11-12 LAB — VITAMIN B12: Vitamin B-12: 601 pg/mL (ref 180–914)

## 2022-11-12 MED ORDER — PANTOPRAZOLE SODIUM 40 MG PO TBEC
40.0000 mg | DELAYED_RELEASE_TABLET | Freq: Two times a day (BID) | ORAL | Status: AC
  Administered 2022-11-12 – 2022-11-15 (×6): 40 mg via ORAL
  Filled 2022-11-12 (×6): qty 1

## 2022-11-12 MED ORDER — FUROSEMIDE 10 MG/ML IJ SOLN
20.0000 mg | Freq: Once | INTRAMUSCULAR | Status: AC
  Administered 2022-11-12: 20 mg via INTRAVENOUS
  Filled 2022-11-12: qty 2

## 2022-11-12 MED ORDER — AMIODARONE HCL 200 MG PO TABS
200.0000 mg | ORAL_TABLET | Freq: Every day | ORAL | Status: DC
  Administered 2022-11-13 – 2022-11-16 (×4): 200 mg via ORAL
  Filled 2022-11-12 (×4): qty 1

## 2022-11-12 MED ORDER — METOPROLOL SUCCINATE ER 25 MG PO TB24
25.0000 mg | ORAL_TABLET | Freq: Every day | ORAL | Status: DC
  Administered 2022-11-13 – 2022-11-15 (×3): 25 mg via ORAL
  Filled 2022-11-12 (×3): qty 1

## 2022-11-12 MED ORDER — CALCIUM CARBONATE 1250 (500 CA) MG PO TABS
1.0000 | ORAL_TABLET | Freq: Three times a day (TID) | ORAL | Status: DC
  Administered 2022-11-13 – 2022-11-26 (×36): 1250 mg via ORAL
  Filled 2022-11-12 (×42): qty 1

## 2022-11-12 MED ORDER — SODIUM CHLORIDE 0.9% IV SOLUTION
Freq: Once | INTRAVENOUS | Status: AC
  Filled 2022-11-12: qty 250

## 2022-11-12 MED ORDER — ACETAMINOPHEN 325 MG PO TABS
650.0000 mg | ORAL_TABLET | Freq: Four times a day (QID) | ORAL | Status: AC | PRN
  Administered 2022-11-12 – 2022-11-16 (×4): 650 mg via ORAL
  Filled 2022-11-12 (×4): qty 2

## 2022-11-12 MED ORDER — BUPROPION HCL ER (XL) 150 MG PO TB24
150.0000 mg | ORAL_TABLET | Freq: Every day | ORAL | Status: DC
  Administered 2022-11-13 – 2022-11-26 (×14): 150 mg via ORAL
  Filled 2022-11-12 (×14): qty 1

## 2022-11-12 MED ORDER — VENLAFAXINE HCL ER 75 MG PO CP24
150.0000 mg | ORAL_CAPSULE | Freq: Every day | ORAL | Status: DC
  Administered 2022-11-13 – 2022-11-26 (×14): 150 mg via ORAL
  Filled 2022-11-12 (×14): qty 2

## 2022-11-12 MED ORDER — LORATADINE 10 MG PO TABS
10.0000 mg | ORAL_TABLET | Freq: Every day | ORAL | Status: DC
  Administered 2022-11-13 – 2022-11-26 (×14): 10 mg via ORAL
  Filled 2022-11-12 (×14): qty 1

## 2022-11-12 MED ORDER — LACTATED RINGERS IV BOLUS
500.0000 mL | Freq: Once | INTRAVENOUS | Status: AC
  Administered 2022-11-12: 500 mL via INTRAVENOUS

## 2022-11-12 MED ORDER — ONDANSETRON HCL 4 MG/2ML IJ SOLN: 4.0000 mg | Freq: Four times a day (QID) | INTRAMUSCULAR | Status: AC | PRN

## 2022-11-12 MED ORDER — UMECLIDINIUM BROMIDE 62.5 MCG/ACT IN AEPB
1.0000 | INHALATION_SPRAY | Freq: Every day | RESPIRATORY_TRACT | Status: DC
  Administered 2022-11-13 – 2022-11-26 (×14): 1 via RESPIRATORY_TRACT
  Filled 2022-11-12 (×2): qty 7

## 2022-11-12 MED ORDER — DIPHENHYDRAMINE HCL 50 MG/ML IJ SOLN
25.0000 mg | Freq: Once | INTRAMUSCULAR | Status: AC
  Administered 2022-11-12: 25 mg via INTRAVENOUS
  Filled 2022-11-12: qty 1

## 2022-11-12 MED ORDER — HYDRALAZINE HCL 20 MG/ML IJ SOLN: 5.0000 mg | Freq: Three times a day (TID) | INTRAMUSCULAR | Status: DC | PRN

## 2022-11-12 MED ORDER — ALBUTEROL SULFATE (2.5 MG/3ML) 0.083% IN NEBU: 3.0000 mL | INHALATION_SOLUTION | Freq: Four times a day (QID) | RESPIRATORY_TRACT | Status: DC | PRN

## 2022-11-12 MED ORDER — ONDANSETRON HCL 4 MG PO TABS: 4.0000 mg | ORAL_TABLET | Freq: Four times a day (QID) | ORAL | Status: AC | PRN

## 2022-11-12 MED ORDER — METOPROLOL SUCCINATE ER 25 MG PO TB24: 25.0000 mg | ORAL_TABLET | Freq: Every day | ORAL | 1 refills | Status: DC

## 2022-11-12 MED ORDER — ATORVASTATIN CALCIUM 20 MG PO TABS
40.0000 mg | ORAL_TABLET | Freq: Every day | ORAL | Status: DC
  Administered 2022-11-13 – 2022-11-26 (×14): 40 mg via ORAL
  Filled 2022-11-12 (×14): qty 2

## 2022-11-12 MED ORDER — ACETAMINOPHEN 650 MG RE SUPP: 650.0000 mg | Freq: Four times a day (QID) | RECTAL | Status: AC | PRN

## 2022-11-12 MED ORDER — FLUTICASONE FUROATE-VILANTEROL 100-25 MCG/ACT IN AEPB
1.0000 | INHALATION_SPRAY | Freq: Every day | RESPIRATORY_TRACT | Status: DC
  Administered 2022-11-13 – 2022-11-26 (×14): 1 via RESPIRATORY_TRACT
  Filled 2022-11-12 (×2): qty 28

## 2022-11-12 MED ORDER — SENNOSIDES-DOCUSATE SODIUM 8.6-50 MG PO TABS
1.0000 | ORAL_TABLET | Freq: Every evening | ORAL | Status: DC | PRN
  Administered 2022-11-15: 1 via ORAL
  Filled 2022-11-12: qty 1

## 2022-11-12 NOTE — Patient Instructions (Signed)
Medication Instructions:  DISCONTINUE carvedilol (Coreg) START Toprol XL 25 mg-take one tablet by mouth daily.   *If you need a refill on your cardiac medications before your next appointment, please call your pharmacy*  Lab Work: BMP, CBC, and BNP to be drawn today - Please go to the [facility]. You will check in at the front desk to the right as you walk into the atrium. Valet Parking is offered if needed. - No appointment needed. You may go any day between 7 am and 6 pm.  If you have labs (blood work) drawn today and your tests are completely normal, you will receive your results only by: [place] (if you have MyChart) OR A paper copy in the mail If you have any lab test that is abnormal or we need to change your treatment, we will call you to review the results.   Testing/Procedures: chest X-ray  Follow-Up: At [facility], you and your health needs are our priority.  As part of our continuing mission to provide you with exceptional heart care, we have created designated Provider Care Teams.  These Care Teams include your primary Cardiologist (physician) and Advanced Practice Providers (APPs -  Physician Assistants and Nurse Practitioners) who all work together to provide you with the care you need, when you need it.  We recommend signing up for the patient portal called "MyChart".  Sign up information is provided on this After Visit Summary.  MyChart is used to connect with patients for Virtual Visits (Telemedicine).  Patients are able to view lab/test results, encounter notes, upcoming appointments, etc.  Non-urgent messages can be sent to your provider as well.   To learn more about what you can do with MyChart, go to [email or web address].    Your next appointment:   1 month(s)  Provider:   Ida Rogue, MD

## 2022-11-12 NOTE — Progress Notes (Incomplete)
       CROSS COVER NOTE  NAME: Dawn Sherman MRN: [id number] DOB : 09/25/1955 ATTENDING PHYSICIAN: Cox, Amy N, DO    Date of Service   11/12/2022   HPI/Events of Note   Report *** On Review of chart *** Bedside eval*** HPI***  Interventions   Assessment/Plan: Hold blood for now LR + Benadryl Transfuse 1U PRBC with premed X    *** professional thanks      To reach the provider On-Call:   7AM- 7PM see care teams to locate the attending and reach out to them via [email or web address] Password: [email or web address] 7PM-7AM contact night-coverage If you still have difficulty reaching the appropriate provider, please page the [facility] (Director on Call) for [facility] on amion for assistance  This document was prepared using Systems analyst and may include unintentional dictation errors.  Neomia Glass DNP, MBA, FNP-BC, PMHNP-BC Nurse Practitioner [facility] Pager [phone]

## 2022-11-12 NOTE — Assessment & Plan Note (Signed)
Amiodarone 200 mg daily resumed Eliquis 2.5 mg p.o. twice daily not resumed on admission due to acute on chronic anemia, symptomatic anemia We will resume home Eliquis from tomorrow as there is no obvious bleeding. Check FOBT

## 2022-11-12 NOTE — Assessment & Plan Note (Addendum)
Completed echo done on 09/28/2022: Left ventricular ejection fraction estimated at 35 to 40%, left ventricle global hypokinesis, left ventricular internal cavity size is moderately to severely dilated. Strict I's and O's

## 2022-11-12 NOTE — Assessment & Plan Note (Signed)
-   PPI resumed °

## 2022-11-12 NOTE — Assessment & Plan Note (Addendum)
Likely multifactorial in setting of symptomatic acute on chronic anemia and small bilateral pleural effusion Radiology read chest x-ray as possible right lower lung, check procalcitonin, if positive, we will initiate antibiotics Strict I's and O's

## 2022-11-12 NOTE — Assessment & Plan Note (Signed)
Patient reports that she has quit, her last cigarette was 1.5 months ago

## 2022-11-12 NOTE — Assessment & Plan Note (Addendum)
Patient's baseline hemoglobin over the last 2 months has been 9.5-10.6 S/p partial 1 unit of PRBC as she developed worsening shortness of breath while getting transfusion which was discontinued, patient also received IV Lasix overnight.  Hemoglobin improved to 8.  Anemia panel with some iron deficiency. Denies any obvious bleeding, patient was on Eliquis at home which is being held. -Check FOBT -Give IV iron supplement -Monitor hemoglobin

## 2022-11-12 NOTE — ED Triage Notes (Signed)
Patient to ED via [place] from cardiology office. Patient had blood work drawn this AM and potassium is 5.5 and hemoglobin is 7.1. Patient states she has been SOB and weak for 3 days.  Patient is 88% on RA. States she wears O2 at night but not during the day. Placed on 2L [place] and now 94% on 2L.

## 2022-11-12 NOTE — Assessment & Plan Note (Signed)
-   Atorvastatin 40 mg daily resumed 

## 2022-11-12 NOTE — ED Provider Notes (Signed)
[facility] Provider Note    Event Date/Time   First MD Initiated Contact with Patient 11/12/22 1435     (approximate)   History   Abnormal Labs   HPI  Dawn Sherman is a 67 y.o. female past medical history significant for heart failure, paroxysmal atrial fibrillation on Eliquis, COPD nighttime O2 of 2 L, CKD, hypertension, who presents to the emergency department with shortness of breath.  Patient endorses 2 to 3 days of progressively worsening shortness of breath.  States that she was evaluated at her cardiologist and was told to come to the emergency department after lab work and a low hemoglobin level.  States that she has been having worsening shortness of breath and worse with exertion over the past 3 days.  Denies any chest pain.  Denies any nausea vomiting or diarrhea.  Endorses constipation.  Denies any melena or black dark tarry stools.  Does take Eliquis for her atrial fibrillation.  States that she feels very tired and weak.  Colonoscopy in the past.  Multiple recent medication changes including switching Lasix to furosemide and switching from diltiazem to Coreg.  No history of blood transfusion in the past.  Denies any alcohol use.  No NSAID use.     Physical Exam   Triage Vital Signs: ED Triage Vitals  Enc Vitals Group     BP 11/12/22 1345 135/69     Pulse Rate 11/12/22 1345 73     Resp 11/12/22 1345 18     Temp 11/12/22 1345 97.8 F (36.6 C)     Temp Source 11/12/22 1345 Oral     SpO2 11/12/22 1345 (!) 88 %     Weight --      Height --      Head Circumference --      Peak Flow --      Pain Score 11/12/22 1347 0     Pain Loc --      Pain Edu? --      Excl. in [place]? --     Most recent vital signs: Vitals:   11/12/22 1429 11/12/22 1440  BP: 133/73   Pulse: 72   Resp: 17   Temp:    SpO2: 100% 94%    Physical Exam Constitutional:      Appearance: She is well-developed.  HENT:     Head: Atraumatic.  Eyes:      Conjunctiva/sclera: Conjunctivae normal.  Cardiovascular:     Rate and Rhythm: Regular rhythm.  Pulmonary:     Effort: Respiratory distress present.     Comments: Hypoxic to 88%, placed on 2 L nasal cannula with improvement to 94%.  Lungs are clear to auscultation.  Speaking in 2 word sentences. Abdominal:     General: There is no distension.  Genitourinary:    Comments: Rectal exam no gross blood or melena Musculoskeletal:        General: Normal range of motion.     Cervical back: Normal range of motion.     Right lower leg: Edema present.     Left lower leg: Edema present.     Comments: Trace pitting edema to bilateral lower extremities.  No unilateral leg swelling.  Skin:    General: Skin is warm.  Neurological:     Mental Status: She is alert. Mental status is at baseline.     IMPRESSION / MDM / ASSESSMENT AND PLAN / ED COURSE  I reviewed the triage vital signs and the nursing  notes.  On chart review patient has been followed by cardiology and had a hemoglobin level of 7.1 on lab work.  Patient's hemoglobin is close to 9 at baseline.  Does have history of CKD.  Takes Eliquis for her anticoagulation.  Patient's recent stress test with an EF of 45%.  Differential diagnosis including symptomatic anemia, GI bleed, anemia of chronic disease/CKD, electrolyte abnormality, pneumonia, heart failure exacerbation  Typed and screened  EKG  I, Nathaniel Man, the attending physician, personally viewed and interpreted this ECG.   Rate: 71  Rhythm: Normal sinus  Axis: Normal  Intervals: Normal  ST&T Change: Significant artifact with wandering baseline, appears to have ST depression.  Repeating EKG.  No tachycardic or bradycardic dysrhythmias while on cardiac telemetry.  RADIOLOGY I independently reviewed imaging, my interpretation of imaging: Chest x-ray done earlier today independently reviewed with signs of questionable pleural effusion to the right side.  Will obtain repeat  chest x-ray in the emergency department.  LABS (all labs ordered are listed, but only abnormal results are displayed) Labs interpreted as -    Labs Reviewed  BASIC METABOLIC PANEL - Abnormal; Notable for the following components:      Result Value   Sodium 134 (*)    Potassium 5.2 (*)    Glucose, Bld 141 (*)    BUN 40 (*)    Creatinine, Ser 2.71 (*)    Calcium 8.7 (*)    GFR, Estimated 19 (*)    All other components within normal limits  CBC - Abnormal; Notable for the following components:   RBC 3.16 (*)    Hemoglobin 7.7 (*)    HCT 26.0 (*)    MCH 24.4 (*)    MCHC 29.6 (*)    RDW 17.1 (*)    All other components within normal limits  URINALYSIS, ROUTINE W REFLEX MICROSCOPIC  BRAIN NATRIURETIC PEPTIDE  CBG MONITORING, ED  TYPE AND SCREEN  TROPONIN I (HIGH SENSITIVITY)     MDM    Patient presents to the emergency department on on lab work patient with worsening anemia which compares new when compared to baseline.  Patient does have symptoms of hypoxia with increased work of breathing.  Concern for possible symptomatic anemia.  Patient was typed and screened.  Rectal exam with no gross blood or melena.  Does take anticoagulation but no history of a GI bleed.  Possibly secondary to CKD.  Potassium elevated at 5.2 unable to assess for any EKG changes given significant artifact.  Will obtain a repeat EKG.  Added on BNP and troponin.  Will repeat chest x-ray to further evaluate for possible pneumonia or heart failure exacerbation.  Consulted hospitalist for admission for acute hypoxic respiratory failure in the setting of symptomatic anemia.    PROCEDURES:  Critical Care performed: No  Procedures  Patient's presentation is most consistent with acute presentation with potential threat to life or bodily function.   MEDICATIONS ORDERED IN ED: Medications - No data to display  FINAL CLINICAL IMPRESSION(S) / ED DIAGNOSES   Final diagnoses:  Anemia, unspecified type   Hypoxia  Dyspnea, unspecified type     Rx / DC Orders   ED Discharge Orders     None        Note:  This document was prepared using Dragon voice recognition software and may include unintentional dictation errors.   Nathaniel Man, MD 11/12/22 1537

## 2022-11-12 NOTE — H&P (Addendum)
History and Physical   Dawn Sherman [id number] DOB: July 14, 1956 DOA: 11/12/2022  PCP: [name], MD  Outpatient Specialists: Dr. Rockey Situ, [facility] cardiology Patient coming from: Cardiology outpatient clinic via [place]  I have personally briefly reviewed patient's old medical records in [place].  Chief Concern: Symptomatic anemia  HPI: Dawn Sherman is a 67 year old female with history of heart failure preserved ejection fraction, paroxysmal atrial fibrillation on low-dose Eliquis, history of subarachnoid hemorrhage secondary to mechanical fall, CKD stage IV, COPD, hypertension, who presents to emergency department from cardiology outpatient clinic for chief concerns of symptomatic anemia.  Vitals in the ED showed temperature of 97.8, respiration rate 18, heart rate 66, blood pressure 116/66, SpO2 of 97% on room air.  Serum sodium is 134, potassium 5.2, chloride 99, bicarb 25, BUN 40, serum creatinine of 2.71, nonfasting blood glucose 131, WBC 8.1, hemoglobin 7.7, platelets of 308.  eGFR 19.  High sensitive troponin has been ordered and is in process.  ED treatment: None ------------------------------ At bedside, patient was able to tell me her name, her age, the current location, the current calendar year.  She reports she has been feeling short of breath especially with exertion over the last 2 weeks and gradually worsening.  She denies trauma to her person, chest pain, dysuria, hematuria, blood in her stool, diarrhea, coffee-ground or blood in vomitus.  She says occasional nausea and denies any vomiting.  She endorses an unintentional 5 pound weight gain in the last 2 weeks.  She denies swelling of her lower extremities.  She reports that she has been prescribed 3 L nasal cannula nightly however over the last 3 days she has had to wear her oxygen continuously even into the day and especially when she is exerting herself.  She reports this is new for her.  Social  history: She lives at home with her husband.  She is a former tobacco user, her last cigarette was 1.5 months ago.  She denies EtOH and recreational drug use.  ROS: Constitutional: + weight gain, no fever ENT/Mouth: no sore throat, no rhinorrhea Eyes: no eye pain, no vision changes Cardiovascular: no chest pain, + dyspnea,  no edema, no palpitations Respiratory: no cough, no sputum, no wheezing Gastrointestinal: no nausea, no vomiting, no diarrhea, no constipation Genitourinary: no urinary incontinence, no dysuria, no hematuria Musculoskeletal: no arthralgias, no myalgias Skin: no skin lesions, no pruritus, Neuro: + weakness, no loss of consciousness, no syncope Psych: no anxiety, no depression, + decrease appetite Heme/Lymph: no bruising, no bleeding  ED Course: Discussed with emergency medicine provider, patient requiring hospitalization for chief concerns of symptomatic anemia.  Assessment/Plan  Principal Problem:   Symptomatic anemia Active Problems:   Acute hypoxemic respiratory failure (HCC)   Acute on chronic combined systolic and diastolic CHF (congestive heart failure) (HCC)   Persistent atrial fibrillation (HCC)   CKD (chronic kidney disease), stage IV (HCC)   HLD (hyperlipidemia)   Tobacco use   Hypothyroidism   Depression, major, single episode, mild (HCC)   Essential hypertension   GERD without esophagitis   (HFpEF) heart failure with preserved ejection fraction (HCC)   Assessment and Plan:  * Symptomatic anemia Patient's baseline hemoglobin over the last 2 months has been 9.5-10.6 Check anemia panel Patient has been typed and screened on day of admission at 1240p Negative for signs of GI bleed Will order 1 unit of PRBC, goal hemoglobin is greater than 8 Check CBC in a.m.  Acute hypoxemic respiratory failure (HCC) Likely  multifactorial in setting of symptomatic acute on chronic anemia and small bilateral pleural effusion Radiology read chest x-ray as  possible right lower lung, check procalcitonin, if positive, we will initiate antibiotics Strict I's and O's  Acute on chronic combined systolic and diastolic CHF (congestive heart failure) ([place]) Completed echo done on 09/28/2022: Left ventricular ejection fraction estimated at 35 to 40%, left ventricle global hypokinesis, left ventricular internal cavity size is moderately to severely dilated. I did not order a new echo on admission Strict I's and O's  Persistent atrial fibrillation (HCC) Amiodarone 200 mg daily resumed Eliquis 2.5 mg p.o. twice daily not resumed on admission due to acute on chronic anemia, symptomatic anemia AM team to resume when the benefits outweigh the risk  HLD (hyperlipidemia) Atorvastatin 40 mg daily resumed  Tobacco use Patient reports that she has quit, her last cigarette was 1.5 months ago  Dyslipidemia Atorvastatin 40 mg daily resumed  GERD without esophagitis PPI resumed  Essential hypertension Metoprolol succinate 25 mg daily resumed Hydralazine 5 mg IV every 8 hours as needed for SBP greater than 175, 4 days ordered  Depression, major, single episode, mild (HCC) Bupropion 150 mg and venlafaxine 150 mg daily resumed  Chart reviewed.   DVT prophylaxis: TED hose; AM team to initiate pharmacologic DVT prophylaxis when the benefits outweigh the risk Code Status: full code Diet: Heart healthy Family Communication: no Disposition Plan: Pending clinical course Consults called: none at this time Admission status: Telemetry cardiac, inpatient  Past Medical History:  Diagnosis Date   B12 deficiency    CHF (congestive heart failure) (HCC)    CKD (chronic kidney disease), stage III (HCC)    COPD (chronic obstructive pulmonary disease) ([place])    Depression    Endometriosis    Hypertension    Mixed hyperlipidemia    Tobacco abuse    Past Surgical History:  Procedure Laterality Date   ABDOMINAL HYSTERECTOMY     BREAST BIOPSY Right 2015   neg-   FIBROADENOMA   TAH/RSO  1999   secondary to bleeding and endometriosis (Dr Vernie Ammons)   Social History:  reports that she has quit smoking. Her smoking use included cigarettes. She has a 10.00 pack-year smoking history. She has never used smokeless tobacco. She reports that she does not currently use alcohol. She reports that she does not use drugs.  Allergies  Allergen Reactions   Sulfate Rash   Codeine Sulfate Nausea Only   Benicar [Olmesartan]     Talked with patient February 10, 2020, intolerance is unclear, tried several medications around that time and one of them gave her a rash but she is not clear which 1.   Amoxicillin Rash   Clindamycin/Lincomycin Rash   Entresto [Sacubitril-Valsartan] Other (See Comments)    hyperkalemia   Morphine And Related Rash   Penicillins Rash   Family History  Problem Relation Age of Onset   Hypercholesterolemia Mother    Rheum arthritis Father    Rheum arthritis Daughter    Fibromyalgia Daughter    Breast cancer Neg Hx    Colon cancer Neg Hx    Family history: Family history reviewed and not pertinent.  Prior to Admission medications   Medication Sig Start Date End Date Taking? Authorizing Provider  acetaminophen (TYLENOL) 650 MG CR tablet Take 650 mg by mouth every 8 (eight) hours as needed for pain or fever.    [provider]  albuterol (VENTOLIN HFA) 108 (90 Base) MCG/ACT inhaler Inhale 2 puffs into the lungs  every 6 (six) hours as needed for wheezing or shortness of breath. 09/18/22   [name], MD  amiodarone (PACERONE) 200 MG tablet Take 1 tablet (200 mg total) by mouth daily. 09/12/22   Duard Brady, MD  apixaban (ELIQUIS) 2.5 MG TABS tablet Take 1 tablet (2.5 mg total) by mouth 2 (two) times daily. 04/08/22   Minna Merritts, MD  atorvastatin (LIPITOR) 40 MG tablet Take 1 tablet (40 mg total) by mouth daily. 10/09/22   [name], MD  buPROPion (WELLBUTRIN XL) 150 MG 24 hr tablet Take 1 tablet (150 mg total) by mouth  daily. 10/09/22   [name], MD  calcium carbonate (OS-CAL - DOSED IN MG OF ELEMENTAL CALCIUM) 1250 (500 Ca) MG tablet Take 1 tablet (500 mg of elemental calcium total) by mouth 3 (three) times daily with meals. 12/14/21   Fritzi Mandes, MD  FARXIGA 10 MG TABS tablet Take 10 mg by mouth daily. 11/20/21   [provider]  Fluticasone-Umeclidin-Vilant (TRELEGY ELLIPTA) 100-62.5-25 MCG/ACT AEPB Inhale 1 puff into the lungs daily. 03/05/22   [name], MD  loratadine (CLARITIN) 10 MG tablet Take 10 mg by mouth daily.    [provider]  metoprolol succinate (TOPROL XL) 25 MG 24 hr tablet Take 1 tablet (25 mg total) by mouth daily. 11/12/22   Furth, Cadence H, PA-C  omeprazole (PRILOSEC) 20 MG capsule Take 20 mg by mouth daily.    [provider]  torsemide (DEMADEX) 20 MG tablet Take 2 tablets (40 mg) by mouth once daily. You may take an extra 2 tablets (40 mg) in the afternoon as needed for leg swelling 10/08/22   Minna Merritts, MD  venlafaxine XR (EFFEXOR-XR) 150 MG 24 hr capsule TAKE ONE CAPSULE BY MOUTH EVERY DAY 10/09/22   [name], MD    Physical Exam: Vitals:   11/12/22 2047 11/12/22 2132 11/12/22 2155 11/12/22 2158  BP: 129/63 123/68  134/66  Pulse: 81 79  79  Resp: 18 17  18   Temp: 98.2 F (36.8 C) 98.1 F (36.7 C)  97.9 F (36.6 C)  TempSrc:  Oral  Oral  SpO2: 100% 97%  97%  Weight:   60.2 kg   Height:   5\' 5"  (1.651 m)    Constitutional: appears age appropriate, NAD, calm, comfortable Eyes: PERRL, lids and conjunctivae normal ENMT: Mucous membranes are moist. Posterior pharynx clear of any exudate or lesions. Age-appropriate dentition. Hearing appropriate Neck: normal, supple, no masses, no thyromegaly Respiratory: clear to auscultation bilaterally, no wheezing, no crackles. Increased respiratory effort. No accessory muscle use.  Cardiovascular: Regular rate and rhythm, no murmurs / rubs / gallops. No extremity edema. 2+ pedal pulses. No  carotid bruits.  Abdomen: no tenderness, no masses palpated, no hepatosplenomegaly. Bowel sounds positive.  Musculoskeletal: no clubbing / cyanosis. No joint deformity upper and lower extremities. Good ROM, no contractures, no atrophy. Normal muscle tone.  Skin: no rashes, lesions, ulcers. No induration Neurologic: Sensation intact. Strength 5/5 in all 4.  Psychiatric: Normal judgment and insight. Alert and oriented x 3. Normal mood.   EKG: independently reviewed, showing accelerated junctional rhythm with rate of 71, QTc 460  Chest x-ray on Admission: I personally reviewed and I agree with radiologist reading as below.  DG Chest Port 1 View  Result Date: 11/12/2022 CLINICAL DATA:  Shortness of breath EXAM: PORTABLE CHEST 1 VIEW COMPARISON:  Previous studies including the examination done earlier today FINDINGS: Transverse diameter of heart is increased. Increased markings are  seen in right parahilar region and right lower lung field suggesting asymmetric interstitial edema or interstitial pneumonia. Right lung is smaller than left. There are small transverse linear densities in left lower lung field. There is blunting of both lateral CP angles. There is no pneumothorax. IMPRESSION: Cardiomegaly. Increased markings in right parahilar region and right lower lung fields suggest possible pneumonia. Linear densities in left parahilar region suggest subsegmental atelectasis. Small bilateral pleural effusions. Electronically Signed   By: Elmer Picker M.D.   On: 11/12/2022 16:11    Labs on Admission: I have personally reviewed following labs CBC: Recent Labs  Lab 11/12/22 1125 11/12/22 1357  WBC 7.5 8.1  HGB 7.1* 7.7*  HCT 23.9* 26.0*  MCV 82.4 82.3  PLT 295 [id number]   Basic Metabolic Panel: Recent Labs  Lab 11/12/22 1125 11/12/22 1357  NA 136 134*  K 5.5* 5.2*  CL 101 99  CO2 27 25  GLUCOSE 77 141*  BUN 42* 40*  CREATININE 2.76* 2.71*  CALCIUM 8.9 8.7*   GFR: Estimated  Creatinine Clearance: 18.1 mL/min (A) (by C-G formula based on SCr of 2.71 mg/dL (H)).  Urine analysis:    Component Value Date/Time   COLORURINE YELLOW (A) 03/25/2022 2210   APPEARANCEUR CLEAR (A) 03/25/2022 2210   LABSPEC 1.016 03/25/2022 2210   PHURINE 5.0 03/25/2022 2210   GLUCOSEU NEGATIVE 03/25/2022 2210   GLUCOSEU NEGATIVE 01/22/2017 1052   HGBUR NEGATIVE 03/25/2022 2210   BILIRUBINUR NEGATIVE 03/25/2022 2210   KETONESUR NEGATIVE 03/25/2022 2210   PROTEINUR >=300 (A) 03/25/2022 2210   UROBILINOGEN 0.2 01/22/2017 1052   NITRITE NEGATIVE 03/25/2022 2210   LEUKOCYTESUR NEGATIVE 03/25/2022 2210   This document was prepared using Dragon Voice Recognition software and may include unintentional dictation errors.  Dr. Tobie Poet [facility]  If 7PM-7AM, please contact overnight-coverage provider If 7AM-7PM, please contact day coverage provider [email or web address]  11/12/2022, 10:49 PM

## 2022-11-12 NOTE — Assessment & Plan Note (Signed)
Metoprolol succinate 25 mg daily resumed Hydralazine 5 mg IV every 8 hours as needed for SBP greater than 175, 4 days ordered

## 2022-11-12 NOTE — Assessment & Plan Note (Signed)
Bupropion 150 mg and venlafaxine 150 mg daily resumed

## 2022-11-12 NOTE — Progress Notes (Signed)
Cardiology Office Note:    Date:  11/12/2022   ID:  Dawn Sherman, DOB 04/27/1956, MRN [id number]  PCP:  [name], MD  [facility] Cardiologist:  Ida Rogue, MD  [place] Electrophysiologist:  None   Referring MD: [name], MD   Chief Complaint: 1 month follow-up  History of Present Illness:    Dawn Sherman is a 67 y.o. female with a hx of HFpEF, PAF on low dose anticoagulation, h/o SAH secondary to mechanical fall in April 2023, CKD stage IV, COPD, and hypertension who is being seen for follow-up.  Patient was admitted in 2021 with dyspnea.  Echo at that time showed an EF of 30 to [id number], grade 2 diastolic dysfunction, moderate MR, mild to moderate TR, normal RV SF, mildly elevated PASP.  She declined cardiac cath at that time.  Lexiscan Myoview was intermediate risk due to LVEF, though showed no evidence of ischemia.  She was noted to have new onset A-fib and was eventually started on Eliquis.  Repeat echo in December 2021 demonstrated EF of 50 to [id number], grade 1 diastolic dysfunction, she was admitted in April 2023 with a fall in the context of GI illness and weakness and found to have an [place] with [place] recommended to be held at that time until cleared by neurosurgery.  Echo during that admission showed EF of 65 to 70%, mild LVH.  Patient was admitted to the hospital July 2023 with acute on chronic HFpEF and was diuresed.  She remained off [place] at that time.  Patient was later restarted on Eliquis 2.5 by her primary cardiologist in August 2023.  Patient was admitted in November 2023 with acute hypoxic respiratory failure requiring BiPAP and acute on chronic heart failure treated with diuresis.  Patient was hospitalized in January 2024 with acute on chronic heart failure and pneumonia. Echo showed drop in EF to 35 to 40%.  Patient was last seen February 2024 and diltiazem was stopped due to reduced EF, and she was placed on Coreg.  Lasix was changed to torsemide.  Due to CKD  patient is not a good cath candidate.  Myoview Lexiscan was ordered.  Today, the patient reports DOE worse since last couple days. Weight is up 4 lbs. She is taking torsemide 40mg  once daily, she took an extra torsemide dose the last 2 weeks. Weight is up about 4 lbs. She has no swelling in her feet. She feels abdominal firmness and bloating. No chest pain. Patient sees kidney doctor regularly.   Past Medical History:  Diagnosis Date   B12 deficiency    CHF (congestive heart failure) (HCC)    CKD (chronic kidney disease), stage III (HCC)    COPD (chronic obstructive pulmonary disease) ([place])    Depression    Endometriosis    Hypertension    Mixed hyperlipidemia    Tobacco abuse     Past Surgical History:  Procedure Laterality Date   ABDOMINAL HYSTERECTOMY     BREAST BIOPSY Right 2015   neg-  FIBROADENOMA   TAH/RSO  1999   secondary to bleeding and endometriosis (Dr Vernie Ammons)    Current Medications: Current Meds  Medication Sig   acetaminophen (TYLENOL) 650 MG CR tablet Take 650 mg by mouth every 8 (eight) hours as needed for pain or fever.   albuterol (VENTOLIN HFA) 108 (90 Base) MCG/ACT inhaler Inhale 2 puffs into the lungs every 6 (six) hours as needed for wheezing or shortness of breath.   amiodarone (PACERONE) 200  MG tablet Take 1 tablet (200 mg total) by mouth daily.   apixaban (ELIQUIS) 2.5 MG TABS tablet Take 1 tablet (2.5 mg total) by mouth 2 (two) times daily.   atorvastatin (LIPITOR) 40 MG tablet Take 1 tablet (40 mg total) by mouth daily.   buPROPion (WELLBUTRIN XL) 150 MG 24 hr tablet Take 1 tablet (150 mg total) by mouth daily.   calcium carbonate (OS-CAL - DOSED IN MG OF ELEMENTAL CALCIUM) 1250 (500 Ca) MG tablet Take 1 tablet (500 mg of elemental calcium total) by mouth 3 (three) times daily with meals.   FARXIGA 10 MG TABS tablet Take 10 mg by mouth daily.   Fluticasone-Umeclidin-Vilant (TRELEGY ELLIPTA) 100-62.5-25 MCG/ACT AEPB Inhale 1 puff into the lungs daily.    loratadine (CLARITIN) 10 MG tablet Take 10 mg by mouth daily.   metoprolol succinate (TOPROL XL) 25 MG 24 hr tablet Take 1 tablet (25 mg total) by mouth daily.   omeprazole (PRILOSEC) 20 MG capsule Take 20 mg by mouth daily.   torsemide (DEMADEX) 20 MG tablet Take 2 tablets (40 mg) by mouth once daily. You may take an extra 2 tablets (40 mg) in the afternoon as needed for leg swelling   venlafaxine XR (EFFEXOR-XR) 150 MG 24 hr capsule TAKE ONE CAPSULE BY MOUTH EVERY DAY   [DISCONTINUED] carvedilol (COREG) 12.5 MG tablet Take 1 tablet (12.5 mg total) by mouth 2 (two) times daily.     Allergies:   Sulfate, Codeine sulfate, Benicar [olmesartan], Amoxicillin, Clindamycin/lincomycin, Entresto [sacubitril-valsartan], Morphine and related, and Penicillins   Social History   Socioeconomic History   Marital status: Married    Spouse name: Not on file   Number of children: Not on file   Years of education: Not on file   Highest education level: Not on file  Occupational History   Not on file  Tobacco Use   Smoking status: Former    Packs/day: 0.25    Years: 40.00    Additional pack years: 0.00    Total pack years: 10.00    Types: Cigarettes   Smokeless tobacco: Never   Tobacco comments:    Patient quit smoking recently   Vaping Use   Vaping Use: Never used  Substance and Sexual Activity   Alcohol use: Not Currently    Alcohol/week: 0.0 standard drinks of alcohol    Comment: occasional   Drug use: No   Sexual activity: Not Currently  Other Topics Concern   Not on file  Social History Narrative   Lives in [place] with her husband.  She is retired from [employer].  She does not routinely exercise.   Social Determinants of Health   Financial Resource Strain: Low Risk  (11/27/2021)   Overall Financial Resource Strain (CARDIA)    Difficulty of Paying Living Expenses: Not hard at all  Food Insecurity: No Food Insecurity (10/02/2022)   Hunger Vital Sign    Worried About  Running Out of Food in the Last Year: Never true    Ran Out of Food in the Last Year: Never true  Transportation Needs: No Transportation Needs (10/02/2022)   PRAPARE - Hydrologist (Medical): No    Lack of Transportation (Non-Medical): No  Physical Activity: Sufficiently Active (11/27/2021)   Exercise Vital Sign    Days of Exercise per Week: 7 days    Minutes of Exercise per Session: 30 min  Stress: No Stress Concern Present (11/27/2021)   [employer]  Occupational Health - Occupational Stress Questionnaire    Feeling of Stress : Not at all  Social Connections: Unknown (11/27/2021)   Social Connection and Isolation Panel [NHANES]    Frequency of Communication with Friends and Family: Not on file    Frequency of Social Gatherings with Friends and Family: Not on file    Attends Religious Services: Not on file    Active Member of Clubs or Organizations: Not on file    Attends Archivist Meetings: Not on file    Marital Status: Married     Family History: The patient's family history includes Fibromyalgia in her daughter; Hypercholesterolemia in her mother; Rheum arthritis in her daughter and father. There is no history of Breast cancer or Colon cancer.  ROS:   Please see the history of present illness.     All other systems reviewed and are negative.  EKGs/Labs/Other Studies Reviewed:    The following studies were reviewed today:  Myoview Lexiscan 09/2022   Findings are consistent with infarction with peri-infarct ischemia. The study is intermediate risk.   LV perfusion is abnormal. There is evidence of ischemia. There is evidence of infarction. Defect 1: There is a small defect with moderate reduction in uptake present in the apical apex location(s) that is partially reversible. There is abnormal wall motion in the defect area. Consistent with infarction with mild peri-infarct ischemia.   down sloping ST depression (II, III, aVF, V5 and V6) was  noted.   Left ventricular function is abnormal. End diastolic cavity size is moderately enlarged.   Mildly reduced EF of 43 %   CT attenuation images with very mild aortic and coronary calcifications.  Echo limited 09/2022  1. Left ventricular ejection fraction, by estimation, is 35 to 40%. The  left ventricle has moderately decreased function. The left ventricle  demonstrates global hypokinesis. The left ventricular internal cavity size  was moderately to severely dilated.   2. Right ventricular systolic function is moderately reduced. The right  ventricular size is moderately enlarged. Moderately increased right  ventricular wall thickness.   3. Left atrial size was severely dilated.   4. Right atrial size was severely dilated.   5. The mitral valve is normal in structure. Moderate mitral valve  regurgitation. No evidence of mitral stenosis.   6. The aortic valve is normal in structure. Aortic valve regurgitation is  not visualized. No aortic stenosis is present.   7. The inferior vena cava is normal in size with greater than 50%  respiratory variability, suggesting right atrial pressure of 3 mmHg.   Echo 06/2022 1. Left ventricular ejection fraction, by estimation, is 50 to 55%. The  left ventricle has low normal function. The left ventricle has no regional  wall motion abnormalities. The left ventricular internal cavity size was  mildly dilated. Left ventricular  diastolic parameters are consistent with Grade II diastolic dysfunction  (pseudonormalization).   2. Right ventricular systolic function is normal. The right ventricular  size is normal.   3. Left atrial size was mildly dilated.   4. The mitral valve is normal in structure. Moderate mitral valve  regurgitation. No evidence of mitral stenosis.   5. The aortic valve is normal in structure. Aortic valve regurgitation is  not visualized. Mild aortic valve stenosis. Aortic valve area, by VTI  measures 1.92 cm. Aortic valve  mean gradient measures 8.0 mmHg.   6. The inferior vena cava is normal in size with greater than 50%  respiratory variability, suggesting right  atrial pressure of 3 mmHg.   EKG:  EKG is ordered today.  The ekg ordered today demonstrates normal sinus rhythm, 66 bpm, first-degree AV block, nonspecific ST and T wave changes  Recent Labs: 07/26/2022: TSH 12.62 09/07/2022: ALT 24 09/27/2022: B Natriuretic Peptide >4,500.0 09/29/2022: Magnesium 2.0 09/30/2022: Hemoglobin 9.7; Platelets 254 11/05/2022: BUN 35; Creatinine, Ser 2.68; Potassium 5.1; Sodium 136  Recent Lipid Panel    Component Value Date/Time   CHOL 175 09/24/2022 0525   TRIG 46 09/24/2022 0525   HDL 87 09/24/2022 0525   CHOLHDL 2.0 09/24/2022 0525   VLDL 9 09/24/2022 0525   LDLCALC 79 09/24/2022 0525   LDLDIRECT 53.3 10/14/2012 0821     Physical Exam:    VS:  BP 116/66 (BP Location: Left Arm, Patient Position: Sitting, Cuff Size: Normal)   Pulse 66   Ht 5\' 5"  (1.651 m)   Wt 132 lb 12.8 oz (60.2 kg)   SpO2 97%   BMI 22.10 kg/m     Wt Readings from Last 3 Encounters:  11/12/22 132 lb 12.8 oz (60.2 kg)  11/05/22 128 lb 8 oz (58.3 kg)  10/15/22 128 lb 6 oz (58.2 kg)     GEN:  Well nourished, well developed in no acute distress HEENT: Normal NECK: No JVD; No carotid bruits LYMPHATICS: No lymphadenopathy CARDIAC: RRR, no murmurs, rubs, gallops RESPIRATORY:  diminished lung sounds at the bases ABDOMEN: Soft, non-tender, non-distended MUSCULOSKELETAL:  trace lower leg edema; No deformity  SKIN: Warm and dry NEUROLOGIC:  Alert and oriented x 3 PSYCHIATRIC:  Normal affect   ASSESSMENT:    1. Dyspnea on exertion   2. Chronic systolic heart failure (HCC)   3. Paroxysmal A-fib ([place])   4. CKD (chronic kidney disease), stage IV ([place])   5. Essential hypertension    PLAN:    In order of problems listed above:  HFrEF Patient has a history of reduced EF with improvement of EF.  Most recent echo showed reduced EF at 35 to  40%.  Subsequent Myoview Lexiscan were consistent with peri-infarct ischemia, study was intermediate risk.  Abnormal LV perfusion with evidence of ischemia with a small defect in the apical location that is partly reversible.  CT images with mild aortic and coronary calcifications.  Given CKD patient is not a good cath candidate.  Patient denies chest pain but reports worsening dyspnea on exertion with 4 lbs weight gain.  She reports she has doubled up on her torsemide for the last 2 weeks.  Normal doses 40 mg daily, she has been taking 40 mg twice daily.  Diminished lung sounds on exam, trace edema on lower legs.  I will check a chest x-ray, CBC, BMET, BNP.  Continue Farxiga 10 mg daily. GDTM limited by CKD.  I will stop Coreg and start Toprol 12.5 mg daily.  We will call the patient with any further instructions.  Paroxysmal A-fib EKG today shows normal sinus rhythm.  Patient is taking Eliquis 2.5 mg twice daily.  She has a history of SAH.  Continue beta-blocker for rate control.  CKD stage IV Scr baseline around 2.6. Patient follows with nephrology.  Hypertension Blood pressure is good today.  Stop Coreg and start Toprol as above.  Disposition: Follow up in 1 month(s) with MD/APP   Signed, Katelen Luepke Ninfa Meeker, PA-C  11/12/2022 10:25 AM    [facility]

## 2022-11-12 NOTE — Hospital Course (Addendum)
Dawn Sherman is a 67 year old female with history of heart failure preserved ejection fraction, paroxysmal atrial fibrillation on low-dose Eliquis, history of subarachnoid hemorrhage secondary to mechanical fall, CKD stage IV, COPD, hypertension, who presents to emergency department from cardiology outpatient clinic for chief concerns of symptomatic anemia.  Vitals in the ED showed temperature of 97.8, respiration rate 18, heart rate 66, blood pressure 116/66, SpO2 of 97% on room air.  Serum sodium is 134, potassium 5.2, chloride 99, bicarb 25, BUN 40, serum creatinine of 2.71, nonfasting blood glucose 131, WBC 8.1, hemoglobin 7.7, platelets of 308.  eGFR 19.  BNP elevated at 3247.  High sensitive troponin has been ordered and is in process.  ED treatment: None  3/20: Vital stable on 4 L of oxygen this morning.  Apparently patient developed shortness of breath with flushing and muscle aches while getting blood transfusion which was discontinued at that time UA and transfusion reaction labs were negative.  Chest x-ray with pulmonary vascular congestion s/p IV Lasix and Benadryl.  CBC this morning with hemoglobin of 8.  Anemia panel with iron deficiency.  IV iron supplement ordered. BMP with slight worsening of creatinine to 2.96.  And potassium of 5.3.  Nephrology was also consulted.  3/21: Vitals and labs seems stable.  Remained on 3 L of oxygen.  Cardiology was also consulted and patient will be going for right heart catheterization tomorrow.  3/22: Vital stable, renal function continued to get worse slowly, persistent hyperkalemia with potassium at 5.4 despite getting V[place] - dose was increased today. Echocardiogram with worsening of EF and right heart catheterization today with significantly elevated pressures.  Patient was started on milrinone and Lasix infusion by cardiology.  3/23: Vital stable, remained on 3 L of oxygen.  Hemoglobin improved to 10, slight worsening of renal function  with creatinine at 2.91, UOP recorded 2350, patient remains on milrinone and Lasix infusion.  3/24: Hemodynamically stable.  Worsening renal function, UOP recorded of more than 5 L in 24 hours, patient also received metolazone.  CVP or 10.  After discussing with cardiology and nephrology we are holding Lasix infusion today.  A-fib with RVR today

## 2022-11-13 ENCOUNTER — Encounter: Payer: Medicare Other | Admitting: Internal Medicine

## 2022-11-13 ENCOUNTER — Inpatient Hospital Stay: Payer: Medicare Other

## 2022-11-13 DIAGNOSIS — I10 Essential (primary) hypertension: Secondary | ICD-10-CM

## 2022-11-13 DIAGNOSIS — D649 Anemia, unspecified: Secondary | ICD-10-CM | POA: Diagnosis not present

## 2022-11-13 DIAGNOSIS — J9601 Acute respiratory failure with hypoxia: Secondary | ICD-10-CM

## 2022-11-13 DIAGNOSIS — E875 Hyperkalemia: Secondary | ICD-10-CM

## 2022-11-13 DIAGNOSIS — I5043 Acute on chronic combined systolic (congestive) and diastolic (congestive) heart failure: Secondary | ICD-10-CM | POA: Diagnosis not present

## 2022-11-13 DIAGNOSIS — I4819 Other persistent atrial fibrillation: Secondary | ICD-10-CM | POA: Diagnosis not present

## 2022-11-13 DIAGNOSIS — N184 Chronic kidney disease, stage 4 (severe): Secondary | ICD-10-CM

## 2022-11-13 LAB — TYPE AND SCREEN
ABO/RH(D): O NEG
Antibody Screen: NEGATIVE
Unit division: 0

## 2022-11-13 LAB — TRANSFUSION REACTION
DAT C3: NEGATIVE
Post RXN DAT IgG: NEGATIVE

## 2022-11-13 LAB — CBC
HCT: 26.5 % — ABNORMAL LOW (ref 36.0–46.0)
Hemoglobin: 8 g/dL — ABNORMAL LOW (ref 12.0–15.0)
MCH: 24.6 pg — ABNORMAL LOW (ref 26.0–34.0)
MCHC: 30.2 g/dL (ref 30.0–36.0)
MCV: 81.5 fL (ref 80.0–100.0)
Platelets: 284 10*3/uL (ref 150–400)
RBC: 3.25 MIL/uL — ABNORMAL LOW (ref 3.87–5.11)
RDW: 17 % — ABNORMAL HIGH (ref 11.5–15.5)
WBC: 9.2 10*3/uL (ref 4.0–10.5)
nRBC: 0 % (ref 0.0–0.2)

## 2022-11-13 LAB — BPAM RBC
Blood Product Expiration Date: 202404182359
ISSUE DATE / TIME: 202403192121
Unit Type and Rh: 5100

## 2022-11-13 LAB — URINALYSIS, COMPLETE (UACMP) WITH MICROSCOPIC
Bacteria, UA: NONE SEEN
Bilirubin Urine: NEGATIVE
Glucose, UA: 150 mg/dL — AB
Hgb urine dipstick: NEGATIVE
Ketones, ur: NEGATIVE mg/dL
Leukocytes,Ua: NEGATIVE
Nitrite: NEGATIVE
Protein, ur: 100 mg/dL — AB
Specific Gravity, Urine: 1.009 (ref 1.005–1.030)
pH: 7 (ref 5.0–8.0)

## 2022-11-13 LAB — BASIC METABOLIC PANEL
Anion gap: 6 (ref 5–15)
BUN: 43 mg/dL — ABNORMAL HIGH (ref 8–23)
CO2: 26 mmol/L (ref 22–32)
Calcium: 8.4 mg/dL — ABNORMAL LOW (ref 8.9–10.3)
Chloride: 103 mmol/L (ref 98–111)
Creatinine, Ser: 2.96 mg/dL — ABNORMAL HIGH (ref 0.44–1.00)
GFR, Estimated: 17 mL/min — ABNORMAL LOW (ref >60)
Glucose, Bld: 123 mg/dL — ABNORMAL HIGH (ref 70–99)
Potassium: 5.3 mmol/L — ABNORMAL HIGH (ref 3.5–5.1)
Sodium: 135 mmol/L (ref 135–145)

## 2022-11-13 MED ORDER — PATIROMER SORBITEX CALCIUM 8.4 G PO PACK
8.4000 g | PACK | Freq: Every day | ORAL | Status: DC
  Administered 2022-11-13 – 2022-11-15 (×3): 8.4 g via ORAL
  Filled 2022-11-13 (×3): qty 1

## 2022-11-13 MED ORDER — CLONAZEPAM 0.125 MG PO TBDP
0.1250 mg | ORAL_TABLET | Freq: Two times a day (BID) | ORAL | Status: DC | PRN
  Administered 2022-11-13 – 2022-11-14 (×3): 0.125 mg via ORAL
  Filled 2022-11-13 (×3): qty 1

## 2022-11-13 MED ORDER — CLONAZEPAM 0.125 MG PO TBDP
0.1250 mg | ORAL_TABLET | Freq: Once | ORAL | Status: AC
  Administered 2022-11-13: 0.125 mg via ORAL
  Filled 2022-11-13: qty 1

## 2022-11-13 MED ORDER — DIPHENHYDRAMINE HCL 50 MG/ML IJ SOLN
25.0000 mg | Freq: Once | INTRAMUSCULAR | Status: DC
  Filled 2022-11-13: qty 1

## 2022-11-13 MED ORDER — SODIUM CHLORIDE 0.9 % IV SOLN
510.0000 mg | Freq: Once | INTRAVENOUS | Status: AC
  Administered 2022-11-13: 510 mg via INTRAVENOUS
  Filled 2022-11-13: qty 17

## 2022-11-13 NOTE — TOC Initial Note (Signed)
Transition of Care [facility]) - Initial/Assessment Note    Patient Details  Name: Dawn Sherman MRN: [id number] Date of Birth: Jun 20, 1956  Transition of Care [facility]) CM/SW Contact:    Laurena Slimmer, RN Phone Number: 11/13/2022, 11:20 AM  Clinical Narrative:                  Transition of Care [facility]) Screening Note   Patient Details  Name: Dawn Sherman Date of Birth: 31-Aug-1955   Transition of Care [facility]) CM/SW Contact:    Laurena Slimmer, RN Phone Number: 11/13/2022, 11:20 AM    Transition of Care Department [facility]) has reviewed patient and no TOC needs have been identified at this time. We will continue to monitor patient advancement through interdisciplinary progression rounds. If new patient transition needs arise, please place a TOC consult.          Patient Goals and CMS Choice            Expected Discharge Plan and Services                                              Prior Living Arrangements/Services                       Activities of Daily Living Home Assistive Devices/Equipment: Oxygen ADL Screening (condition at time of admission) Patient's cognitive ability adequate to safely complete daily activities?: Yes Is the patient deaf or have difficulty hearing?: No Does the patient have difficulty seeing, even when wearing glasses/contacts?: No Does the patient have difficulty concentrating, remembering, or making decisions?: No Patient able to express need for assistance with ADLs?: No Does the patient have difficulty dressing or bathing?: No Independently performs ADLs?: Yes (appropriate for developmental age) Does the patient have difficulty walking or climbing stairs?: No Weakness of Legs: Both Weakness of Arms/Hands: Both  Permission Sought/Granted                  Emotional Assessment              Admission diagnosis:  Hypoxia [R09.02] Symptomatic anemia [D64.9] Anemia, unspecified type [D64.9] Dyspnea,  unspecified type [R06.00] Patient Active Problem List   Diagnosis Date Noted   Symptomatic anemia 11/12/2022   Pneumonia due to infectious organism 09/30/2022   Pleural effusion due to CHF (congestive heart failure) ([place]) 09/30/2022   Acute on chronic diastolic CHF (congestive heart failure) ([place]) 09/23/2022   Chest pain 09/23/2022   Acute on chronic respiratory failure with hypoxia ([place]) 09/23/2022   (HFpEF) heart failure with preserved ejection fraction ([place]) 09/07/2022   Positive D dimer 09/07/2022   Depression with anxiety 09/07/2022   Hypothyroidism 09/07/2022   Epigastric pain 08/14/2022   SOB (shortness of breath) 07/16/2022   Fatigue 07/16/2022   Acute on chronic congestive heart failure ([place]) 07/04/2022   Acute hypoxemic respiratory failure ([place]) 06/30/2022   Tachycardia 06/30/2022   Anemia 05/06/2022   Chronic heart failure with preserved ejection fraction ([place]) 03/26/2022   Myocardial injury 03/26/2022   CKD (chronic kidney disease), stage IV ([place]) 03/26/2022   Iron deficiency anemia 03/26/2022   Generalized weakness 12/17/2021   Essential hypertension 12/17/2021   Dyslipidemia 12/17/2021   Depression, major, single episode, complete remission ([place]) 12/17/2021   GERD without esophagitis 12/17/2021   Chronic diastolic CHF (congestive heart failure) ([place]) 12/17/2021  AKI (acute kidney injury) ([place]) 12/16/2021   Second degree AV block    History of subarachnoid hemorrhage 12/10/2021   Hypokalemia 12/10/2021   Hyponatremia 12/10/2021   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   Persistent atrial fibrillation ([place]) 12/10/2021   Hypotension 12/10/2021   Swelling of lower extremity 10/28/2021   Prediabetes 08/19/2021   Chronic obstructive pulmonary disease ([place]) 11/22/2020   Paroxysmal atrial fibrillation ([place]) 08/12/2020   Dehydration    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[place])    Acute on chronic combined systolic and diastolic CHF (congestive heart failure) ([place]) 02/10/2020    Leukocytosis 02/10/2020   HLD (hyperlipidemia) 02/10/2020   Elevated troponin 02/10/2020   GERD (gastroesophageal reflux disease) 02/10/2020   Acute respiratory failure with hypoxia ([place]) 02/10/2020   Abnormal mammogram 07/13/2019   History of alcohol abuse 07/31/2018   B12 deficiency 07/31/2018   Acute renal failure superimposed on stage 3b chronic kidney disease ([place]) 11/03/2016   Health care maintenance 12/02/2014   Tobacco use 12/02/2014   Hypercholesterolemia 11/29/2014   Encounter for screening colonoscopy 12/16/2013   Breast mass 12/15/2012   Hypertension 07/05/2012   Depression, major, single episode, mild ([place]) 07/05/2012   PCP:  [name], MD Pharmacy:   [facility] [place], [place] [address] [place] [address] Phone: [phone] Fax: [phone]     Social Determinants of Health (SDOH) Social History: SDOH Screenings   Food Insecurity: No Food Insecurity (11/12/2022)  Housing: Low Risk  (11/12/2022)  Transportation Needs: No Transportation Needs (11/12/2022)  Utilities: Not At Risk (11/12/2022)  Depression (PHQ2-9): Low Risk  (10/09/2022)  Financial Resource Strain: Low Risk  (11/27/2021)  Physical Activity: Sufficiently Active (11/27/2021)  Social Connections: Unknown (11/27/2021)  Stress: No Stress Concern Present (11/27/2021)  Tobacco Use: Medium Risk (11/12/2022)   SDOH Interventions:     Readmission Risk Interventions    07/02/2022   12:26 PM 12/11/2021    3:17 PM  Readmission Risk Prevention Plan  Transportation Screening Complete Complete  PCP or Specialist Appt within 3-5 Days  Complete  HRI or [place]  Complete  Social Work Consult for [place] Planning/Counseling  Complete  Palliative Care Screening  Not Applicable  Medication Review Press photographer) Complete Complete  PCP or Specialist appointment within 3-5 days of discharge Complete   SW Recovery Care/Counseling Consult Complete    [place] Not Applicable

## 2022-11-13 NOTE — Progress Notes (Signed)
[facility]  ROUNDING NOTE   Subjective:   Ms. YUETTE TEALL was admitted to [facility] on 11/12/2022 for Hypoxia [R09.02] Symptomatic anemia [D64.9] Anemia, unspecified type [D64.9] Dyspnea, unspecified type [R06.00]  Patient was last seen by nephrology on 11/20/2021 for her chronic kidney disease by Dr. [name].   Patient presented to cardiology clinic yesterday with shortness of breath and dyspnea on exertion. Patient was asked to be evaluated by the Emergency Department. Patient was found to have symptomatic anemia and was given 1 unit PRBC. Patient also given IV furosemide 20mg  twice overnight.   Objective:  Vital signs in last 24 hours:  Temp:  [97.9 F (36.6 C)-98.3 F (36.8 C)] 98.3 F (36.8 C) (03/20 1100) Pulse Rate:  [75-86] 77 (03/20 1236) Resp:  [17-24] 18 (03/20 1100) BP: (113-142)/(50-97) 142/74 (03/20 1100) SpO2:  [91 %-100 %] 94 % (03/20 1236) Weight:  [60.2 kg-62.1 kg] 62.1 kg (03/20 0712)  Weight change:  Filed Weights   11/12/22 2155 11/13/22 0712  Weight: 60.2 kg 62.1 kg    Intake/Output: I/O last 3 completed shifts: In: 464.5 [P.O.:240; Blood:224.5] Out: 350 [Urine:350]   Intake/Output this shift:  Total I/O In: 480 [P.O.:480] Out: 100 [Urine:100]  Physical Exam: General: NAD,   Head: Normocephalic, atraumatic. Moist oral mucosal membranes  Eyes: Anicteric, PERRL  Neck: Supple, trachea midline  Lungs:  Clear to auscultation  Heart: irregular  Abdomen:  Soft, nontender,   Extremities:  no peripheral edema.  Neurologic: Nonfocal, moving all four extremities  Skin: No lesions  Access: none    Basic Metabolic Panel: Recent Labs  Lab 11/12/22 1125 11/12/22 1357 11/13/22 0428  NA 136 134* 135  K 5.5* 5.2* 5.3*  CL 101 99 103  CO2 27 25 26   GLUCOSE 77 141* 123*  BUN 42* 40* 43*  CREATININE 2.76* 2.71* 2.96*  CALCIUM 8.9 8.7* 8.4*    Liver Function Tests: No results for input(s): "AST", "ALT", "ALKPHOS", "BILITOT", "PROT",  "ALBUMIN" in the last 168 hours. No results for input(s): "LIPASE", "AMYLASE" in the last 168 hours. No results for input(s): "AMMONIA" in the last 168 hours.  CBC: Recent Labs  Lab 11/12/22 1125 11/12/22 1357 11/13/22 0428  WBC 7.5 8.1 9.2  HGB 7.1* 7.7* 8.0*  HCT 23.9* 26.0* 26.5*  MCV 82.4 82.3 81.5  PLT 295 308 284    Cardiac Enzymes: No results for input(s): "CKTOTAL", "CKMB", "CKMBINDEX", "TROPONINI" in the last 168 hours.  BNP: Invalid input(s): "POCBNP"  CBG: No results for input(s): "GLUCAP" in the last 168 hours.  Microbiology: Results for orders placed or performed during the hospital encounter of 09/23/22  Resp panel by RT-PCR (RSV, Flu A&B, Covid) Anterior Nasal Swab     Status: None   Collection Time: 09/23/22  5:56 AM   Specimen: Anterior Nasal Swab  Result Value Ref Range Status   SARS Coronavirus 2 by RT PCR NEGATIVE NEGATIVE Final    Comment: (NOTE) SARS-CoV-2 target nucleic acids are NOT DETECTED.  The SARS-CoV-2 RNA is generally detectable in upper respiratory specimens during the acute phase of infection. The lowest concentration of SARS-CoV-2 viral copies this assay can detect is 138 copies/mL. A negative result does not preclude SARS-Cov-2 infection and should not be used as the sole basis for treatment or other patient management decisions. A negative result may occur with  improper specimen collection/handling, submission of specimen other than nasopharyngeal swab, presence of viral mutation(s) within the areas targeted by this assay, and inadequate number of  viral copies(<138 copies/mL). A negative result must be combined with clinical observations, patient history, and epidemiological information. The expected result is Negative.  Fact Sheet for Patients:  EntrepreneurPulse.com.au  Fact Sheet for Healthcare Providers:  IncredibleEmployment.be  This test is no t yet approved or cleared by the [place] FDA and  has been authorized for detection and/or diagnosis of SARS-CoV-2 by FDA under an Emergency Use Authorization (EUA). This EUA will remain  in effect (meaning this test can be used) for the duration of the COVID-19 declaration under Section 564(b)(1) of the Act, 21 U.S.C.section 360bbb-3(b)(1), unless the authorization is terminated  or revoked sooner.       Influenza A by PCR NEGATIVE NEGATIVE Final   Influenza B by PCR NEGATIVE NEGATIVE Final    Comment: (NOTE) The Xpert Xpress SARS-CoV-2/FLU/RSV plus assay is intended as an aid in the diagnosis of influenza from Nasopharyngeal swab specimens and should not be used as a sole basis for treatment. Nasal washings and aspirates are unacceptable for Xpert Xpress SARS-CoV-2/FLU/RSV testing.  Fact Sheet for Patients: EntrepreneurPulse.com.au  Fact Sheet for Healthcare Providers: IncredibleEmployment.be  This test is not yet approved or cleared by the [place] FDA and has been authorized for detection and/or diagnosis of SARS-CoV-2 by FDA under an Emergency Use Authorization (EUA). This EUA will remain in effect (meaning this test can be used) for the duration of the COVID-19 declaration under Section 564(b)(1) of the Act, 21 U.S.C. section 360bbb-3(b)(1), unless the authorization is terminated or revoked.     Resp Syncytial Virus by PCR NEGATIVE NEGATIVE Final    Comment: (NOTE) Fact Sheet for Patients: EntrepreneurPulse.com.au  Fact Sheet for Healthcare Providers: IncredibleEmployment.be  This test is not yet approved or cleared by the [place] FDA and has been authorized for detection and/or diagnosis of SARS-CoV-2 by FDA under an Emergency Use Authorization (EUA). This EUA will remain in effect (meaning this test can be used) for the duration of the COVID-19 declaration under Section 564(b)(1) of the Act, 21 U.S.C. section  360bbb-3(b)(1), unless the authorization is terminated or revoked.  Performed at [facility], [address] [place], [place] [address]   Blood culture (routine x 2)     Status: None   Collection Time: 09/23/22  7:57 AM   Specimen: BLOOD  Result Value Ref Range Status   Specimen Description BLOOD LEFT ANTECUBITAL  Final   Special Requests   Final    BOTTLES DRAWN AEROBIC AND ANAEROBIC Blood Culture adequate volume   Culture   Final    NO GROWTH 5 DAYS Performed at [facility], [address] [place], [place] [address]    Report Status 09/28/2022 FINAL  Final  Blood culture (routine x 2)     Status: None   Collection Time: 09/23/22  7:57 AM   Specimen: BLOOD  Result Value Ref Range Status   Specimen Description BLOOD BLOOD RIGHT HAND  Final   Special Requests   Final    BOTTLES DRAWN AEROBIC AND ANAEROBIC Blood Culture results may not be optimal due to an inadequate volume of blood received in culture bottles   Culture   Final    NO GROWTH 5 DAYS Performed at [facility], [address] [place], [place] [address]    Report Status 09/28/2022 FINAL  Final    Coagulation Studies: No results for input(s): "LABPROT", "INR" in the last 72 hours.  Urinalysis: Recent Labs    11/12/22 [place]*  LABSPEC 1.009  PHURINE 7.0  GLUCOSEU 150*  HGBUR NEGATIVE  BILIRUBINUR NEGATIVE  KETONESUR NEGATIVE  PROTEINUR 100*  NITRITE NEGATIVE  LEUKOCYTESUR NEGATIVE      Imaging: DG Chest 2 View  Result Date: 11/13/2022 CLINICAL DATA:  Dyspnea on exertion EXAM: CHEST - 2 VIEW COMPARISON:  09/30/2022 FINDINGS: Small right pleural effusion has slightly enlarged with progressive right basilar atelectasis or infiltrate. New small left pleural effusion is present. Discoid atelectasis within the left mid lung zone. No pneumothorax. Cardiac size is stably enlarged. Mild central pulmonary vascular congestion without overt pulmonary edema. No acute  bone abnormality. IMPRESSION: 1. Central pulmonary vascular congestion and enlarging, small bilateral pleural effusions suggesting early changes of cardiogenic failure. 2. Stable cardiomegaly. Electronically Signed   By: Fidela Salisbury M.D.   On: 11/13/2022 00:14   DG Chest Port 1 View  Result Date: 11/13/2022 CLINICAL DATA:  Dyspnea on exertion EXAM: PORTABLE CHEST 1 VIEW COMPARISON:  11/12/2022 FINDINGS: Stable cardiomegaly. Stable central pulmonary vascular congestion with superimposed perihilar interstitial pulmonary infiltrate most in keeping with mild cardiogenic pulmonary edema. Small bilateral pleural effusions again identified, unchanged. No pneumothorax. Thoracic dextroscoliosis again noted. No acute bone abnormality. IMPRESSION: 1. Stable mild cardiogenic failure with small bilateral pleural effusions. Electronically Signed   By: Fidela Salisbury M.D.   On: 11/13/2022 00:10   DG Chest Port 1 View  Result Date: 11/12/2022 CLINICAL DATA:  Shortness of breath EXAM: PORTABLE CHEST 1 VIEW COMPARISON:  Previous studies including the examination done earlier today FINDINGS: Transverse diameter of heart is increased. Increased markings are seen in right parahilar region and right lower lung field suggesting asymmetric interstitial edema or interstitial pneumonia. Right lung is smaller than left. There are small transverse linear densities in left lower lung field. There is blunting of both lateral CP angles. There is no pneumothorax. IMPRESSION: Cardiomegaly. Increased markings in right parahilar region and right lower lung fields suggest possible pneumonia. Linear densities in left parahilar region suggest subsegmental atelectasis. Small bilateral pleural effusions. Electronically Signed   By: Elmer Picker M.D.   On: 11/12/2022 16:11     Medications:     amiodarone  200 mg Oral Daily   atorvastatin  40 mg Oral Daily   buPROPion  150 mg Oral Daily   calcium carbonate  1 tablet Oral TID WC    diphenhydrAMINE  25 mg Intravenous Once   fluticasone furoate-vilanterol  1 puff Inhalation Daily   And   umeclidinium bromide  1 puff Inhalation Daily   loratadine  10 mg Oral Daily   metoprolol succinate  25 mg Oral Daily   pantoprazole  40 mg Oral BID   patiromer  8.4 g Oral Daily   venlafaxine XR  150 mg Oral Q breakfast   acetaminophen **OR** acetaminophen, albuterol, hydrALAZINE, ondansetron **OR** ondansetron (ZOFRAN) IV, senna-docusate  Assessment/ Plan:  Ms. JOREY WILSEY is a 67 y.o.  female with hypertension, atrial fibrillation, chronic congestive heart failure, atrophic right kidney, endometriosis, hyperlipidemia, tobacco abuse, GERD, and depression who is admitted to [facility] on 11/12/2022 for Hypoxia [R09.02] Symptomatic anemia [D64.9] Anemia, unspecified type [D64.9] Dyspnea, unspecified type [R06.00]  Chronic kidney disease stage IV: with hyperkalemia and proteinuria. Renal function has progressively decreased in the last few months.  - continue patiromer - hold home dapagliflozin - no acute indication for dialysis.   Hypertension and acute exacerbation of systolic congestive heart failure: on torsemide as outpatient.  - status post IV furosemide - continue metoprolol - Appreciate  cardiology input.   Anemia with chronic kidney disease and iron deficiency: negative for GI bleed.  - will need IV iron - check electropheresis  Secondary Hyperparathyroidism: with hypocalcemia - check PTH and phosphorus    LOS: 1 Devinn Hurwitz 3/20/20243:30 PM

## 2022-11-13 NOTE — Plan of Care (Signed)

## 2022-11-13 NOTE — Progress Notes (Signed)
Progress Note   Patient: Dawn Sherman [id number] DOB: [birth year] DOA: 11/12/2022     1 DOS: the patient was seen and examined on 11/13/2022   Brief hospital course: Dawn Sherman is a 67 year old female with history of heart failure preserved ejection fraction, paroxysmal atrial fibrillation on low-dose Eliquis, history of subarachnoid hemorrhage secondary to mechanical fall, CKD stage IV, COPD, hypertension, who presents to emergency department from cardiology outpatient clinic for chief concerns of symptomatic anemia.  Vitals in the ED showed temperature of 97.8, respiration rate 18, heart rate 66, blood pressure 116/66, SpO2 of 97% on room air.  Serum sodium is 134, potassium 5.2, chloride 99, bicarb 25, BUN 40, serum creatinine of 2.71, nonfasting blood glucose 131, WBC 8.1, hemoglobin 7.7, platelets of 308.  eGFR 19.  BNP elevated at 3247.  High sensitive troponin has been ordered and is in process.  ED treatment: None  3/20: Vital stable on 4 L of oxygen this morning.  Apparently patient developed shortness of breath with flushing and muscle aches while getting blood transfusion which was discontinued at that time UA and transfusion reaction labs were negative.  Chest x-ray with pulmonary vascular congestion s/p IV Lasix and Benadryl.  CBC this morning with hemoglobin of 8.  Anemia panel with iron deficiency.  IV iron supplement ordered. BMP with slight worsening of creatinine to 2.96.  And potassium of 5.3.  Nephrology was also consulted.  Assessment and Plan: * Symptomatic anemia Patient's baseline hemoglobin over the last 2 months has been 9.5-10.6 S/p partial 1 unit of PRBC as she developed worsening shortness of breath while getting transfusion which was discontinued, patient also received IV Lasix overnight.  Hemoglobin improved to 8.  Anemia panel with some iron deficiency. Denies any obvious bleeding, patient was on Eliquis at home which is being held. -Check FOBT -Give  IV iron supplement -Monitor hemoglobin  Acute hypoxemic respiratory failure (HCC) Likely multifactorial in setting of symptomatic acute on chronic anemia and small bilateral pleural effusion Procalcitonin negative.  Patient uses 2 L of oxygen at night at home. -Continue supplemental oxygen-wean as tolerated  Acute on chronic combined systolic and diastolic CHF (congestive heart failure) ([place]) Completed echo done on 09/28/2022: Left ventricular ejection fraction estimated at 35 to 40%, left ventricle global hypokinesis, left ventricular internal cavity size is moderately to severely dilated. Strict I's and O's  Persistent atrial fibrillation (HCC) Amiodarone 200 mg daily resumed Eliquis 2.5 mg p.o. twice daily not resumed on admission due to acute on chronic anemia, symptomatic anemia We will resume home Eliquis from tomorrow as there is no obvious bleeding. Check FOBT  Hyperkalemia Mild hyperkalemia with CKD stage IV .  Potassium at 5.3 -Veltassa -Monitors potassium  CKD (chronic kidney disease), stage IV (HCC) Patient with slight worsening of renal function, creatinine at 2.97. UA with protein urea and BMP with mild hyperkalemia. Patient did receive IV Lasix twice overnight. -Nephrology consult -Monitor renal function -Avoid nephrotoxins  HLD (hyperlipidemia) Atorvastatin 40 mg daily resumed  Tobacco use Patient reports that she has quit, her last cigarette was 1.5 months ago  Essential hypertension Metoprolol succinate 25 mg daily resumed Hydralazine 5 mg IV every 8 hours as needed for SBP greater than 175, 4 days ordered  Dyslipidemia Atorvastatin 40 mg daily resumed  Depression, major, single episode, mild (HCC) Bupropion 150 mg and venlafaxine 150 mg daily resumed  GERD without esophagitis PPI resumed   Subjective: Patient was seen and examined today.  No new complaints.  She denies seeing any obvious bleeding.  Had colonoscopy many years ago.  Uses 2 L of oxygen  at night  Physical Exam: Vitals:   11/13/22 0830 11/13/22 1100 11/13/22 1236 11/13/22 1548  BP: (!) 136/97 (!) 142/74  (!) 145/73  Pulse: 86 76 77 81  Resp: 18 18  18   Temp: 98 F (36.7 C) 98.3 F (36.8 C)  97.9 F (36.6 C)  TempSrc:  Oral  Oral  SpO2: 98% 94% 94% 96%  Weight:      Height:       General.  Well-developed lady, in no acute distress. Pulmonary.  Lungs clear bilaterally, normal respiratory effort. CV.  Regular rate and rhythm, no JVD, rub or murmur. Abdomen.  Soft, nontender, nondistended, BS positive. CNS.  Alert and oriented .  No focal neurologic deficit. Extremities.  No edema, no cyanosis, pulses intact and symmetrical. Psychiatry.  Judgment and insight appears normal.   Data Reviewed: Prior data reviewed  Family Communication: Discussed with patient  Disposition: Status is: Inpatient Remains inpatient appropriate because: Severity of illness  Planned Discharge Destination: Home  Time spent: 45 minutes  This record has been created using Systems analyst. Errors have been sought and corrected,but may not always be located. Such creation errors do not reflect on the standard of care.   Author: Lorella Nimrod, MD 11/13/2022 5:36 PM  For on call review [email or web address].

## 2022-11-13 NOTE — Progress Notes (Addendum)
Patient called reporting symptoms of mild headache. Once Tiffany RN entered the room, she noticed pt was short of breath. Kingstin Heims RN notified. Blood transfusion stopped, vitals obtained, Neomia Glass NP notified. Was told to restart blood. Pt experiencing more symptoms, flushing, muscle pain. Notified Katy NP again. Order to stop blood. New bag of 0.9 Normal Saline started, blood bank notified. Blood bank notified lab. Blood transported back to blood bank by [employer]. Neomia Glass NP by bedside to assess patient, received orders for fluids, benadryl, lasix, labs.

## 2022-11-13 NOTE — Progress Notes (Signed)
Pt complaining of anxiety. Pt states  "I feel jittery" per pt Benadryl makes her anxiety worst. Dr. Reesa Chew notified.per MD will reorder Klonopin.

## 2022-11-13 NOTE — Assessment & Plan Note (Signed)
Patient with slight worsening of renal function, creatinine at 2.97. UA with protein urea and BMP with mild hyperkalemia. Patient did receive IV Lasix twice overnight. -Nephrology consult -Monitor renal function -Avoid nephrotoxins

## 2022-11-13 NOTE — Assessment & Plan Note (Signed)
Mild hyperkalemia with CKD stage IV .  Potassium at 5.3 -Veltassa -Monitors potassium

## 2022-11-14 ENCOUNTER — Inpatient Hospital Stay (HOSPITAL_COMMUNITY)
Admit: 2022-11-14 | Discharge: 2022-11-14 | Disposition: A | Discharge status: Home / Self Care | Payer: Medicare Other | Attending: Cardiovascular Disease | Admitting: Cardiovascular Disease

## 2022-11-14 ENCOUNTER — Encounter: Payer: Self-pay | Admitting: Internal Medicine

## 2022-11-14 DIAGNOSIS — J9601 Acute respiratory failure with hypoxia: Secondary | ICD-10-CM | POA: Diagnosis not present

## 2022-11-14 DIAGNOSIS — N184 Chronic kidney disease, stage 4 (severe): Secondary | ICD-10-CM | POA: Diagnosis not present

## 2022-11-14 DIAGNOSIS — D649 Anemia, unspecified: Secondary | ICD-10-CM | POA: Diagnosis not present

## 2022-11-14 DIAGNOSIS — I480 Paroxysmal atrial fibrillation: Secondary | ICD-10-CM | POA: Diagnosis not present

## 2022-11-14 DIAGNOSIS — I428 Other cardiomyopathies: Secondary | ICD-10-CM

## 2022-11-14 DIAGNOSIS — I5043 Acute on chronic combined systolic (congestive) and diastolic (congestive) heart failure: Secondary | ICD-10-CM | POA: Diagnosis not present

## 2022-11-14 DIAGNOSIS — I4819 Other persistent atrial fibrillation: Secondary | ICD-10-CM | POA: Diagnosis not present

## 2022-11-14 LAB — HEPATIC FUNCTION PANEL
ALT: 20 U/L (ref 0–44)
AST: 24 U/L (ref 15–41)
Albumin: 3.3 g/dL — ABNORMAL LOW (ref 3.5–5.0)
Alkaline Phosphatase: 83 U/L (ref 38–126)
Bilirubin, Direct: <0.1 mg/dL (ref 0.0–0.2)
Total Bilirubin: 1 mg/dL (ref 0.3–1.2)
Total Protein: 6.1 g/dL — ABNORMAL LOW (ref 6.5–8.1)

## 2022-11-14 LAB — ECHOCARDIOGRAM LIMITED
Calc EF: 23.3 %
Height: 65 in
S' Lateral: 4.7 cm
Single Plane A2C EF: 25.2 %
Single Plane A4C EF: 15.7 %
Weight: 1957.68 oz

## 2022-11-14 LAB — CBC
HCT: 26.1 % — ABNORMAL LOW (ref 36.0–46.0)
Hemoglobin: 7.9 g/dL — ABNORMAL LOW (ref 12.0–15.0)
MCH: 24.9 pg — ABNORMAL LOW (ref 26.0–34.0)
MCHC: 30.3 g/dL (ref 30.0–36.0)
MCV: 82.3 fL (ref 80.0–100.0)
Platelets: 282 10*3/uL (ref 150–400)
RBC: 3.17 MIL/uL — ABNORMAL LOW (ref 3.87–5.11)
RDW: 17.1 % — ABNORMAL HIGH (ref 11.5–15.5)
WBC: 8.3 10*3/uL (ref 4.0–10.5)
nRBC: 0 % (ref 0.0–0.2)

## 2022-11-14 LAB — HEMOGLOBIN A1C
Hgb A1c MFr Bld: 6 % — ABNORMAL HIGH (ref 4.8–5.6)
Mean Plasma Glucose: 126 mg/dL

## 2022-11-14 LAB — BASIC METABOLIC PANEL
Anion gap: 9 (ref 5–15)
BUN: 47 mg/dL — ABNORMAL HIGH (ref 8–23)
CO2: 26 mmol/L (ref 22–32)
Calcium: 8.8 mg/dL — ABNORMAL LOW (ref 8.9–10.3)
Chloride: 99 mmol/L (ref 98–111)
Creatinine, Ser: 2.67 mg/dL — ABNORMAL HIGH (ref 0.44–1.00)
GFR, Estimated: 19 mL/min — ABNORMAL LOW (ref >60)
Glucose, Bld: 96 mg/dL (ref 70–99)
Potassium: 5.2 mmol/L — ABNORMAL HIGH (ref 3.5–5.1)
Sodium: 134 mmol/L — ABNORMAL LOW (ref 135–145)

## 2022-11-14 LAB — OCCULT BLOOD X 1 CARD TO LAB, STOOL: Fecal Occult Bld: NEGATIVE

## 2022-11-14 LAB — PHOSPHORUS: Phosphorus: 4.1 mg/dL (ref 2.5–4.6)

## 2022-11-14 LAB — HEPATITIS B CORE ANTIBODY, IGM: Hep B C IgM: NONREACTIVE

## 2022-11-14 LAB — HEPATITIS B SURFACE ANTIGEN: Hepatitis B Surface Ag: NONREACTIVE

## 2022-11-14 LAB — MAGNESIUM: Magnesium: 2.6 mg/dL — ABNORMAL HIGH (ref 1.7–2.4)

## 2022-11-14 LAB — HEPATITIS B CORE ANTIBODY, TOTAL: Hep B Core Total Ab: NONREACTIVE

## 2022-11-14 MED ORDER — SODIUM CHLORIDE 0.9 % IV SOLN: INTRAVENOUS | Status: DC

## 2022-11-14 MED ORDER — SODIUM CHLORIDE 0.9% FLUSH: 3.0000 mL | INTRAVENOUS | Status: DC | PRN

## 2022-11-14 MED ORDER — SODIUM CHLORIDE 0.9 % IV SOLN: 250.0000 mL | INTRAVENOUS | Status: DC | PRN

## 2022-11-14 MED ORDER — SODIUM CHLORIDE 0.9% FLUSH
3.0000 mL | Freq: Two times a day (BID) | INTRAVENOUS | Status: DC
  Administered 2022-11-14 – 2022-11-17 (×7): 3 mL via INTRAVENOUS

## 2022-11-14 NOTE — Progress Notes (Signed)
Mobility Specialist - Progress Note   11/14/22 1451  Mobility  Activity Ambulated with assistance in hallway;Stood at bedside;Dangled on edge of bed  Level of Assistance Standby assist, set-up cues, supervision of patient - no hands on  Assistive Device Front wheel walker  Distance Ambulated (ft) 160 ft  Activity Response Tolerated well  Mobility Referral Yes  $Mobility charge 1 Mobility   Pt supine in bed on 4L upon arrival. Pt dons shoes indep. Pt STS and ambulates 1 lap around NS Supervision. Pt left EOB with needs in reach.   Dawn Sherman  Mobility Specialist  11/14/22 2:52 PM

## 2022-11-14 NOTE — Assessment & Plan Note (Addendum)
Repeat echo done yesterday with worsening of EF, right heart cath today with significantly elevated pressures. Patient is being transferred to ICU to start milrinone and Lasix infusion. -Continue with strict intake and output -Daily BMP and weight

## 2022-11-14 NOTE — Assessment & Plan Note (Addendum)
Resolved -Discontinue Veltassa -Patient is also been started on Lasix infusion -Monitors potassium

## 2022-11-14 NOTE — Progress Notes (Signed)
*  PRELIMINARY RESULTS* Echocardiogram 2D Echocardiogram has been performed.  Sherrie Sport 11/14/2022, 1:10 PM

## 2022-11-14 NOTE — Assessment & Plan Note (Addendum)
Patient's baseline hemoglobin over the last 2 months has been 9.5-10.6 S/p partial 1 unit of PRBC as she developed worsening shortness of breath while getting transfusion which was discontinued, patient also received IV Lasix overnight.  Hemoglobin with another decrease to 7.6.  Anemia panel with some iron deficiency and also consistent with anemia of chronic disease. Denies any obvious bleeding, patient was on Eliquis at home which is being held. Also received IV iron supplement -Check FOBT-negative -Monitor hemoglobin

## 2022-11-14 NOTE — Progress Notes (Signed)
Progress Note   Patient: Dawn Sherman [id number] DOB: 09/12/1955 DOA: 11/12/2022     2 DOS: the patient was seen and examined on 11/14/2022   Brief hospital course: [name] is a 67 year old female with history of heart failure preserved ejection fraction, paroxysmal atrial fibrillation on low-dose Eliquis, history of subarachnoid hemorrhage secondary to mechanical fall, CKD stage IV, COPD, hypertension, who presents to emergency department from cardiology outpatient clinic for chief concerns of symptomatic anemia.  Vitals in the ED showed temperature of 97.8, respiration rate 18, heart rate 66, blood pressure 116/66, SpO2 of 97% on room air.  Serum sodium is 134, potassium 5.2, chloride 99, bicarb 25, BUN 40, serum creatinine of 2.71, nonfasting blood glucose 131, WBC 8.1, hemoglobin 7.7, platelets of 308.  eGFR 19.  BNP elevated at 3247.  High sensitive troponin has been ordered and is in process.  ED treatment: None  3/20: Vital stable on 4 L of oxygen this morning.  Apparently patient developed shortness of breath with flushing and muscle aches while getting blood transfusion which was discontinued at that time UA and transfusion reaction labs were negative.  Chest x-ray with pulmonary vascular congestion s/p IV Lasix and Benadryl.  CBC this morning with hemoglobin of 8.  Anemia panel with iron deficiency.  IV iron supplement ordered. BMP with slight worsening of creatinine to 2.96.  And potassium of 5.3.  Nephrology was also consulted.  3/21: Vitals and labs seems stable.  Remained on 3 L of oxygen.  Cardiology was also consulted and patient will be going for right heart catheterization tomorrow.  Assessment and Plan: * Symptomatic anemia Patient's baseline hemoglobin over the last 2 months has been 9.5-10.6 S/p partial 1 unit of PRBC as she developed worsening shortness of breath while getting transfusion which was discontinued, patient also received IV Lasix overnight.   Hemoglobin improved to 7.9.  Anemia panel with some iron deficiency. Denies any obvious bleeding, patient was on Eliquis at home which is being held. Also received IV iron supplement -Check FOBT-negative -Monitor hemoglobin  Acute hypoxemic respiratory failure (HCC) Likely multifactorial in setting of symptomatic acute on chronic anemia and small bilateral pleural effusion Procalcitonin negative.  Patient uses 2-3 L of oxygen at night at home. -Continue supplemental oxygen-wean as tolerated  Acute on chronic combined systolic and diastolic CHF (congestive heart failure) ([place]) Completed echo done on 09/28/2022: Left ventricular ejection fraction estimated at 35 to 40%, left ventricle global hypokinesis, left ventricular internal cavity size is moderately to severely dilated. Going for right heart catheterization tomorrow morning Strict I's and O's  Persistent atrial fibrillation (HCC) Amiodarone 200 mg daily resumed Eliquis 2.5 mg p.o. twice daily not resumed on admission due to acute on chronic anemia, symptomatic anemia We will resume home Eliquis from tomorrow as there is no obvious bleeding. Check FOBT  Hyperkalemia Mild hyperkalemia with CKD stage IV .  Potassium at 5.2 -[place] -Monitors potassium  CKD (chronic kidney disease), stage IV (HCC) Patient with slight worsening of renal function, creatinine at 2.97. UA with protein urea and BMP with mild hyperkalemia. Patient did receive IV Lasix twice overnight. -Nephrology consult -Monitor renal function -Avoid nephrotoxins  HLD (hyperlipidemia) Atorvastatin 40 mg daily resumed  Tobacco use Patient reports that she has quit, her last cigarette was 1.5 months ago  Essential hypertension Metoprolol succinate 25 mg daily resumed Hydralazine 5 mg IV every 8 hours as needed for SBP greater than 175, 4 days ordered  Dyslipidemia Atorvastatin 40 mg daily  resumed  Depression, major, single episode, mild (HCC) Bupropion 150 mg  and venlafaxine 150 mg daily resumed  GERD without esophagitis PPI resumed   Subjective: Patient remained little short of breath and continued to require oxygen.  Denies any pain.  Physical Exam: Vitals:   11/14/22 0410 11/14/22 0808 11/14/22 1226 11/14/22 1655  BP: (!) 121/103 137/77 136/83 138/83  Pulse: 76 74 69 76  Resp: 18 17 18 18   Temp: (!) 97.4 F (36.3 C) (!) 97.5 F (36.4 C) 97.8 F (36.6 C) 98.5 F (36.9 C)  TempSrc:      SpO2: 92% 100% 99% 97%  Weight:      Height:       General.  Well-developed lady, in no acute distress. Pulmonary.  Few scattered crackles bilaterally, normal respiratory effort. CV.  Regular rate and rhythm, no JVD, rub or murmur. Abdomen.  Soft, nontender, nondistended, BS positive. CNS.  Alert and oriented .  No focal neurologic deficit. Extremities.  No edema, no cyanosis, pulses intact and symmetrical. Psychiatry.  Judgment and insight appears normal.  Data Reviewed: Prior data reviewed  Family Communication: Discussed with patient  Disposition: Status is: Inpatient Remains inpatient appropriate because: Severity of illness  Planned Discharge Destination: Home  Time spent: 44 minutes  This record has been created using Systems analyst. Errors have been sought and corrected,but may not always be located. Such creation errors do not reflect on the standard of care.   Author: Lorella Nimrod, MD 11/14/2022 6:06 PM  For on call review [email or web address].

## 2022-11-14 NOTE — H&P (View-Only) (Signed)
Cardiology Consult    Patient ID: RAKYIA MULLENIX MRN: [id number], DOB/AGE: 1956-04-12   Admit date: 11/12/2022 Date of Consult: 11/14/2022  Primary Physician: [name], MD Primary Cardiologist: Ida Rogue, MD Requesting Provider: Chauncey Cruel. Reesa Chew, MD  Patient Profile    MARYETTA CSASZAR is a 67 y.o. female with a history of COPD, prior tobacco abuse, paroxysmal atrial fibrillation and flutter, moderate mitral regurgitation, hypertension, hyperlipidemia, normocytic anemia, stage IV chronic kidney disease, and recurrent cardiomyopathy and HFrEF, who is being seen today for the evaluation of HFrEF at the request of Dr. Reesa Chew.  Past Medical History   Past Medical History:  Diagnosis Date   B12 deficiency    Cardiomyopathy ([place])    a. 09/2022 Echo: EF 35-40%; b. 09/2022 MV: apical defect w/ mild peri-infarct ischemia. EF 43%.   Chronic HFrEF (heart failure with reduced ejection fraction) ([place])    a. 01/2020 Echo: EF 30-35%; b. 07/2020 Echo: EF 50-55%; c. 06/2022 Echo: EF 50-55%; d. 09/2022 Echo: EF 35-40%, globh HK, mod reduced RV fxn, sev BAE, mod MR.   CKD (chronic kidney disease), stage IV (HCC)    COPD (chronic obstructive pulmonary disease) (HCC)    Depression    Endometriosis    Hypertension    Mixed hyperlipidemia    Moderate mitral regurgitation    Normocytic anemia    PAF (paroxysmal atrial fibrillation) (HCC)    a. CHA2DS2VASc = 4-->eliquis 2.5 bid.   Paroxymsal Atrial flutter ([place])    Tobacco abuse     Past Surgical History:  Procedure Laterality Date   ABDOMINAL HYSTERECTOMY     BREAST BIOPSY Right 2015   neg-  FIBROADENOMA   TAH/RSO  1999   secondary to bleeding and endometriosis (Dr Vernie Ammons)     Allergies  Allergies  Allergen Reactions   Sulfate Rash   Codeine Sulfate Nausea Only   Benicar [Olmesartan]     Talked with patient February 10, 2020, intolerance is unclear, tried several medications around that time and one of them gave her a rash but she is not clear  which 1.   Amoxicillin Rash   Clindamycin/Lincomycin Rash   Entresto [Sacubitril-Valsartan] Other (See Comments)    hyperkalemia   Morphine And Related Rash   Penicillins Rash    History of Present Illness     67 y.o. female with a history of COPD, prior tobacco abuse, paroxysmal atrial fibrillation and flutter, moderate mitral regurgitation, hypertension, hyperlipidemia, normocytic anemia, stage IV chronic kidney disease, and recurrent cardiomyopathy and HFrEF.  She was initially admitted in June 2021 with dyspnea and found to have an EF of 30 to 35%.  She was noted to have new onset atrial fibrillation was placed on Eliquis.  Repeat echo in December 2021 showed improvement in EF to 50-55% with grade 1 diastolic dysfunction.  In April 2023, in the context of GI illness and weakness, she suffered a fall was found to have a subarachnoid hemorrhage.  Oral anticoagulation was initially held but then subsequently resumed once cleared by neurosurgery.  She required admissions in July 2023 and again in November 2023 in the setting of respiratory failure and volume overload.  Echo November 2023 showed an EF of 50 to 55%.  Unfortunately, she was readmitted in January 2024 with heart failure and pneumonia.  Repeat echo, which was performed during an episode of atrial flutter, showed an EF of 35 to 40%.  Following diuresis, she was discharged home.  Stress test performed in the outpatient setting was  intermediate in the setting of an EF of 43% with apical infarct and mild peri-infarct ischemia.  She is a poor diagnostic catheterization candidate secondary to CKD 4.  Over the past 1 to 2 weeks, patient has had progressive dyspnea with minimal activity.  She was seen in cardiology clinic on March 19 and reported 4 pound weight gain in the absence of lower extremity edema.  She did note some increase in abdominal girth.  Lab work was obtained today and she was found to have acute on chronic normocytic anemia with an  H&H of 7.1 and 23.9.  BNP was elevated at 3619.9.  Creatinine stable at 2.76 however, potassium was elevated at 5.5.  Patient was contacted and advised to present to the emergency department for further evaluation and management.  Chest x-ray on arrival showed central pulmonary vascular congestion and small bilateral pleural effusions.  She was given 2 doses of Lasix in the emergency department and subsequently admitted.  BUN and creatinine worsened to 43 and 2.96 on March 20 his Lasix was held.  She was given 1 unit of packed red blood cells with slight improvement in H&H to 8.0/26.5 yesterday.  Patient continues to feel very short of breath with minimal activity in her room and has been wearing oxygen throughout the day.  She typically wears this at night.  She denies any history of chest pain, palpitations, PND, orthopnea, dizziness, syncope, edema, or early satiety.  She continues to feel that her abdomen is somewhat firm.  Inpatient Medications     amiodarone  200 mg Oral Daily   atorvastatin  40 mg Oral Daily   buPROPion  150 mg Oral Daily   calcium carbonate  1 tablet Oral TID WC   fluticasone furoate-vilanterol  1 puff Inhalation Daily   And   umeclidinium bromide  1 puff Inhalation Daily   loratadine  10 mg Oral Daily   metoprolol succinate  25 mg Oral Daily   pantoprazole  40 mg Oral BID   patiromer  8.4 g Oral Daily   sodium chloride flush  3 mL Intravenous Q12H   venlafaxine XR  150 mg Oral Q breakfast    Family History    Family History  Problem Relation Age of Onset   Hypercholesterolemia Mother    Rheum arthritis Father    Rheum arthritis Daughter    Fibromyalgia Daughter    Breast cancer Neg Hx    Colon cancer Neg Hx    She indicated that her mother is deceased. She indicated that her father is deceased. She indicated that her brother is alive. She indicated that her daughter is alive. She indicated that the status of her neg hx is unknown.   Social History     Social History   Socioeconomic History   Marital status: Married    Spouse name: Not on file   Number of children: Not on file   Years of education: Not on file   Highest education level: Not on file  Occupational History   Not on file  Tobacco Use   Smoking status: Former    Packs/day: 0.25    Years: 40.00    Additional pack years: 0.00    Total pack years: 10.00    Types: Cigarettes   Smokeless tobacco: Never   Tobacco comments:    Patient quit smoking recently   Vaping Use   Vaping Use: Never used  Substance and Sexual Activity   Alcohol use: Not Currently  Alcohol/week: 0.0 standard drinks of alcohol    Comment: occasional   Drug use: No   Sexual activity: Not Currently  Other Topics Concern   Not on file  Social History Narrative   Lives in [place] with her husband.  She is retired from [employer].  She does not routinely exercise.   Social Determinants of Health   Financial Resource Strain: Low Risk  (11/27/2021)   Overall Financial Resource Strain (CARDIA)    Difficulty of Paying Living Expenses: Not hard at all  Food Insecurity: No Food Insecurity (11/12/2022)   Hunger Vital Sign    Worried About Running Out of Food in the Last Year: Never true    Ran Out of Food in the Last Year: Never true  Transportation Needs: No Transportation Needs (11/12/2022)   PRAPARE - Hydrologist (Medical): No    Lack of Transportation (Non-Medical): No  Physical Activity: Sufficiently Active (11/27/2021)   Exercise Vital Sign    Days of Exercise per Week: 7 days    Minutes of Exercise per Session: 30 min  Stress: No Stress Concern Present (11/27/2021)   [place]    Feeling of Stress : Not at all  Social Connections: Unknown (11/27/2021)   Social Connection and Isolation Panel [NHANES]    Frequency of Communication with Friends and Family: Not on file    Frequency of  Social Gatherings with Friends and Family: Not on file    Attends Religious Services: Not on file    Active Member of Clubs or Organizations: Not on file    Attends Archivist Meetings: Not on file    Marital Status: Married  Intimate Partner Violence: Not At Risk (11/12/2022)   Humiliation, Afraid, Rape, and Kick questionnaire    Fear of Current or Ex-Partner: No    Emotionally Abused: No    Physically Abused: No    Sexually Abused: No     Review of Systems    General:  No chills, fever, night sweats or weight changes.  Cardiovascular:  No chest pain, +++ dyspnea on exertion, +++ increased abdominal girth, no edema, orthopnea, palpitations, paroxysmal nocturnal dyspnea. Dermatological: No rash, lesions/masses Respiratory: No cough, +++ dyspnea Urologic: No hematuria, dysuria Abdominal:   +++ Increased abdominal girth, no nausea, vomiting, diarrhea, bright red blood per rectum, melena, or hematemesis Neurologic:  No visual changes, wkns, changes in mental status. All other systems reviewed and are otherwise negative except as noted above.  Physical Exam    Blood pressure 136/83, pulse 69, temperature 97.8 F (36.6 C), resp. rate 18, height 5\' 5"  (1.651 m), weight 55.5 kg, SpO2 99 %.  General: Pleasant, NAD Psych: Normal affect. Neuro: Alert and oriented X 3. Moves all extremities spontaneously. HEENT: Normal  Neck: Supple without bruits or JVD. Lungs:  Resp regular and unlabored, diminished breath sounds bilaterally. Heart: RRR no s3, s4, or murmurs. Abdomen: Soft, non-tender, non-distended, BS + x 4.  Extremities: No clubbing, cyanosis or edema. DP/PT2+, Radials 2+ and equal bilaterally.  Labs    Cardiac Enzymes Recent Labs  Lab 11/12/22 1357 11/12/22 1858  TROPONINIHS 10 9     BNP    Component Value Date/Time   BNP 3,247.0 (H) 11/12/2022 1357    Lab Results  Component Value Date   WBC 8.3 11/14/2022   HGB 7.9 (L) 11/14/2022   HCT 26.1 (L) 11/14/2022    MCV 82.3 11/14/2022  PLT 282 11/14/2022    Recent Labs  Lab 11/14/22 0447 11/14/22 1125  NA  --  134*  K  --  5.2*  CL  --  99  CO2  --  26  BUN  --  47*  CREATININE  --  2.67*  CALCIUM  --  8.8*  PROT 6.1*  --   BILITOT 1.0  --   ALKPHOS 83  --   ALT 20  --   AST 24  --   GLUCOSE  --  96   Lab Results  Component Value Date   CHOL 175 09/24/2022   HDL 87 09/24/2022   LDLCALC 79 09/24/2022   TRIG 46 09/24/2022   Lab Results  Component Value Date   DDIMER 5.96 (H) 09/07/2022      Radiology Studies    US RENAL  Result Date: 11/13/2022 CLINICAL DATA:  Renal dysfunction 08/30/2022 EXAM: RENAL / URINARY TRACT ULTRASOUND COMPLETE COMPARISON:  08/30/2022 FINDINGS: Right Kidney: Renal measurements: 6 x 2.4 x 2.6 cm = volume: 18.67 mL. There is no hydronephrosis. Right kidney is small in size. There is marked increase in cortical echogenicity. Left Kidney: Renal measurements: 8 x 9 x 4.7 x 3.6 cm = volume: 78.44 mL. There is increased cortical echogenicity. There is no hydronephrosis. There is a 1 cm cyst in the upper pole. Bladder: Appears normal for degree of bladder distention. Other: Incidental note is made of right pleural effusion. IMPRESSION: There is no hydronephrosis. There is marked atrophy right kidney. There is increased cortical echogenicity in both kidneys suggesting medical renal disease. There is 1 cm cyst in the left kidney.  Right pleural effusion. Electronically Signed   By: Elmer Picker M.D.   On: 11/13/2022 17:52   DG Chest 2 View  Result Date: 11/13/2022 CLINICAL DATA:  Dyspnea on exertion EXAM: CHEST - 2 VIEW COMPARISON:  09/30/2022 FINDINGS: Small right pleural effusion has slightly enlarged with progressive right basilar atelectasis or infiltrate. New small left pleural effusion is present. Discoid atelectasis within the left mid lung zone. No pneumothorax. Cardiac size is stably enlarged. Mild central pulmonary vascular congestion without overt  pulmonary edema. No acute bone abnormality. IMPRESSION: 1. Central pulmonary vascular congestion and enlarging, small bilateral pleural effusions suggesting early changes of cardiogenic failure. 2. Stable cardiomegaly. Electronically Signed   By: Fidela Salisbury M.D.   On: 11/13/2022 00:14   DG Chest Port 1 View  Result Date: 11/13/2022 CLINICAL DATA:  Dyspnea on exertion EXAM: PORTABLE CHEST 1 VIEW COMPARISON:  11/12/2022 FINDINGS: Stable cardiomegaly. Stable central pulmonary vascular congestion with superimposed perihilar interstitial pulmonary infiltrate most in keeping with mild cardiogenic pulmonary edema. Small bilateral pleural effusions again identified, unchanged. No pneumothorax. Thoracic dextroscoliosis again noted. No acute bone abnormality. IMPRESSION: 1. Stable mild cardiogenic failure with small bilateral pleural effusions. Electronically Signed   By: Fidela Salisbury M.D.   On: 11/13/2022 00:10   DG Chest Port 1 View  Result Date: 11/12/2022 CLINICAL DATA:  Shortness of breath EXAM: PORTABLE CHEST 1 VIEW COMPARISON:  Previous studies including the examination done earlier today FINDINGS: Transverse diameter of heart is increased. Increased markings are seen in right parahilar region and right lower lung field suggesting asymmetric interstitial edema or interstitial pneumonia. Right lung is smaller than left. There are small transverse linear densities in left lower lung field. There is blunting of both lateral CP angles. There is no pneumothorax. IMPRESSION: Cardiomegaly. Increased markings in right parahilar region and right lower lung fields  suggest possible pneumonia. Linear densities in left parahilar region suggest subsegmental atelectasis. Small bilateral pleural effusions. Electronically Signed   By: Elmer Picker M.D.   On: 11/12/2022 16:11    ECG & Cardiac Imaging    RSR, 76, 1st deg avb, no acute ST/T changes - personally reviewed.  Assessment & Plan    1.  Acute on  chronic heart failure with reduced ejection fraction/cardiomyopathy: Patient found to have recurrent LV dysfunction in the setting of respiratory failure and volume overload in February 2024.  It is notable, that the echo was performed during a period of paroxysmal atrial flutter.  Myoview on February 19 was intermediate risk with EF 43%, apical infarct, and mild peri-infarct ischemia.  She has been medically managed in the setting of poor candidacy for diagnostic catheterization related to CKD 4.  She has had worsening dyspnea over the past week and was found to have worsening normocytic anemia with markedly elevated BNP and heart failure on chest x-ray, prompting admission.  She has been transfused x 1 unit with slight increase in H&H, currently 7.9/26.1.  She received 2 doses of intravenous Lasix in the ED on March 19 with subsequent bump in BUN and creatinine to 43 and 2.96 respectively.  Creatinine slightly better today at 2.67.  She does not appear to be markedly volume overloaded on exam but she does complain of ongoing abdominal distention.  Will plan limited echo as she is currently in sinus rhythm, to reevaluate LV function.  Will also plan on right heart catheterization tomorrow to help guide diuretic therapy, especially in the setting of stage IV chronic kidney disease.  Continue to hold off on diuretic therapy for now.  Strongly consider 1 additional unit of packed red blood cells.  Continue Toprol-XL.  No acei/arb/arni/mra in the setting of stage IV chronic kidney disease.  If pressure stable, will consider BiDil.  2.  Acute on chronic normocytic anemia: Status post 1 unit packed red blood cells.  Hemoglobin 7.9 this morning.  Recommend 1 additional unit.  Defer to medicine team.  3.  Paroxysmal atrial fibrillation and flutter: Was noted during last admission.  Currently in sinus rhythm on amiodarone and beta-blocker therapy.  Eliquis on hold in the setting of anemia and would not plan to resume  until after right heart catheterization.  4.  COPD/tobacco abuse: Has not smoked since recent hospitalization in February.  Continues to require oxygen throughout the day.  Management per medicine team.  5.  Essential hypertension: Stable.  6.  Hyperlipidemia: Continue atorvastatin therapy.  7.  Hyperkalemia: Potassium slightly better this morning at 5.2.  8.  Stage IV chronic kidney disease: Acute worsening on admission with slight improvement this morning with creatinine of 2.67.  Nephrology following.  Avoiding nephrotoxic agents.  Risk Assessment/Risk Scores:        New York Heart Association (NYHA) Functional Class NYHA Class III    Signed, Murray Hodgkins, NP 11/14/2022, 1:21 PM  For questions or updates, please contact   Please consult [email or web address] for contact info under Cardiology/STEMI.

## 2022-11-14 NOTE — Progress Notes (Signed)
Mobility Specialist - Progress Note   11/14/22 1000  Mobility  Activity Ambulated with assistance in hallway;Stood at bedside;Dangled on edge of bed  Level of Assistance Standby assist, set-up cues, supervision of patient - no hands on  Assistive Device None  Distance Ambulated (ft) 80 ft  Activity Response Tolerated well  Mobility Referral Yes  $Mobility charge 1 Mobility   Pt EOB on 4L upon arrival. Pt dons shoes indep. Pt STS and ambulates in hallway SBA with no LOB noted. Pt returns to EOB with needs in reach.   Dawn Sherman  Mobility Specialist  11/14/22 10:02 AM

## 2022-11-14 NOTE — Consult Note (Signed)
Cardiology Consult    Patient ID: NEKO BRANDSTETTER MRN: [id number], DOB/AGE: 11/18/1955   Admit date: 11/12/2022 Date of Consult: 11/14/2022  Primary Physician: [name], MD Primary Cardiologist: Ida Rogue, MD Requesting Provider: Chauncey Cruel. Reesa Chew, MD  Patient Profile    Dawn Sherman is a 67 y.o. female with a history of COPD, prior tobacco abuse, paroxysmal atrial fibrillation and flutter, moderate mitral regurgitation, hypertension, hyperlipidemia, normocytic anemia, stage IV chronic kidney disease, and recurrent cardiomyopathy and HFrEF, who is being seen today for the evaluation of HFrEF at the request of Dr. Reesa Chew.  Past Medical History   Past Medical History:  Diagnosis Date   B12 deficiency    Cardiomyopathy ([place])    a. 09/2022 Echo: EF 35-40%; b. 09/2022 MV: apical defect w/ mild peri-infarct ischemia. EF 43%.   Chronic HFrEF (heart failure with reduced ejection fraction) ([place])    a. 01/2020 Echo: EF 30-35%; b. 07/2020 Echo: EF 50-55%; c. 06/2022 Echo: EF 50-55%; d. 09/2022 Echo: EF 35-40%, globh HK, mod reduced RV fxn, sev BAE, mod MR.   CKD (chronic kidney disease), stage IV (H[facility])    COPD (chronic obstructive pulmonary disease) (HCC)    Depression    Endometriosis    Hypertension    Mixed hyperlipidemia    Moderate mitral regurgitation    Normocytic anemia    PAF (paroxysmal atrial fibrillation) (HCC)    a. CHA2DS2VASc = 4-->eliquis 2.5 bid.   Paroxymsal Atrial flutter ([place])    Tobacco abuse     Past Surgical History:  Procedure Laterality Date   ABDOMINAL HYSTERECTOMY     BREAST BIOPSY Right 2015   neg-  FIBROADENOMA   TAH/RSO  1999   secondary to bleeding and endometriosis (Dr Vernie Ammons)     Allergies  Allergies  Allergen Reactions   Sulfate Rash   Codeine Sulfate Nausea Only   Benicar [Olmesartan]     Talked with patient February 10, 2020, intolerance is unclear, tried several medications around that time and one of them gave her a rash but she is not clear  which 1.   Amoxicillin Rash   Clindamycin/Lincomycin Rash   Entresto [Sacubitril-Valsartan] Other (See Comments)    hyperkalemia   Morphine And Related Rash   Penicillins Rash    History of Present Illness     67 y.o. female with a history of COPD, prior tobacco abuse, paroxysmal atrial fibrillation and flutter, moderate mitral regurgitation, hypertension, hyperlipidemia, normocytic anemia, stage IV chronic kidney disease, and recurrent cardiomyopathy and HFrEF.  She was initially admitted in June 2021 with dyspnea and found to have an EF of 30 to 35%.  She was noted to have new onset atrial fibrillation was placed on Eliquis.  Repeat echo in December 2021 showed improvement in EF to 50-55% with grade 1 diastolic dysfunction.  In April 2023, in the context of GI illness and weakness, she suffered a fall was found to have a subarachnoid hemorrhage.  Oral anticoagulation was initially held but then subsequently resumed once cleared by neurosurgery.  She required admissions in July 2023 and again in November 2023 in the setting of respiratory failure and volume overload.  Echo November 2023 showed an EF of 50 to 55%.  Unfortunately, she was readmitted in January 2024 with heart failure and pneumonia.  Repeat echo, which was performed during an episode of atrial flutter, showed an EF of 35 to 40%.  Following diuresis, she was discharged home.  Stress test performed in the outpatient setting was  intermediate in the setting of an EF of 43% with apical infarct and mild peri-infarct ischemia.  She is a poor diagnostic catheterization candidate secondary to CKD 4.  Over the past 1 to 2 weeks, patient has had progressive dyspnea with minimal activity.  She was seen in cardiology clinic on March 19 and reported 4 pound weight gain in the absence of lower extremity edema.  She did note some increase in abdominal girth.  Lab work was obtained today and she was found to have acute on chronic normocytic anemia with an  H&H of 7.1 and 23.9.  BNP was elevated at 3619.9.  Creatinine stable at 2.76 however, potassium was elevated at 5.5.  Patient was contacted and advised to present to the emergency department for further evaluation and management.  Chest x-ray on arrival showed central pulmonary vascular congestion and small bilateral pleural effusions.  She was given 2 doses of Lasix in the emergency department and subsequently admitted.  BUN and creatinine worsened to 43 and 2.96 on March 20 his Lasix was held.  She was given 1 unit of packed red blood cells with slight improvement in H&H to 8.0/26.5 yesterday.  Patient continues to feel very short of breath with minimal activity in her room and has been wearing oxygen throughout the day.  She typically wears this at night.  She denies any history of chest pain, palpitations, PND, orthopnea, dizziness, syncope, edema, or early satiety.  She continues to feel that her abdomen is somewhat firm.  Inpatient Medications     amiodarone  200 mg Oral Daily   atorvastatin  40 mg Oral Daily   buPROPion  150 mg Oral Daily   calcium carbonate  1 tablet Oral TID WC   fluticasone furoate-vilanterol  1 puff Inhalation Daily   And   umeclidinium bromide  1 puff Inhalation Daily   loratadine  10 mg Oral Daily   metoprolol succinate  25 mg Oral Daily   pantoprazole  40 mg Oral BID   patiromer  8.4 g Oral Daily   sodium chloride flush  3 mL Intravenous Q12H   venlafaxine XR  150 mg Oral Q breakfast    Family History    Family History  Problem Relation Age of Onset   Hypercholesterolemia Mother    Rheum arthritis Father    Rheum arthritis Daughter    Fibromyalgia Daughter    Breast cancer Neg Hx    Colon cancer Neg Hx    She indicated that her mother is deceased. She indicated that her father is deceased. She indicated that her brother is alive. She indicated that her daughter is alive. She indicated that the status of her neg hx is unknown.   Social History     Social History   Socioeconomic History   Marital status: Married    Spouse name: Not on file   Number of children: Not on file   Years of education: Not on file   Highest education level: Not on file  Occupational History   Not on file  Tobacco Use   Smoking status: Former    Packs/day: 0.25    Years: 40.00    Additional pack years: 0.00    Total pack years: 10.00    Types: Cigarettes   Smokeless tobacco: Never   Tobacco comments:    Patient quit smoking recently   Vaping Use   Vaping Use: Never used  Substance and Sexual Activity   Alcohol use: Not Currently  Alcohol/week: 0.0 standard drinks of alcohol    Comment: occasional   Drug use: No   Sexual activity: Not Currently  Other Topics Concern   Not on file  Social History Narrative   Lives in [place] with her husband.  She is retired from [employer].  She does not routinely exercise.   Social Determinants of Health   Financial Resource Strain: Low Risk  (11/27/2021)   Overall Financial Resource Strain (CARDIA)    Difficulty of Paying Living Expenses: Not hard at all  Food Insecurity: No Food Insecurity (11/12/2022)   Hunger Vital Sign    Worried About Running Out of Food in the Last Year: Never true    Ran Out of Food in the Last Year: Never true  Transportation Needs: No Transportation Needs (11/12/2022)   PRAPARE - Hydrologist (Medical): No    Lack of Transportation (Non-Medical): No  Physical Activity: Sufficiently Active (11/27/2021)   Exercise Vital Sign    Days of Exercise per Week: 7 days    Minutes of Exercise per Session: 30 min  Stress: No Stress Concern Present (11/27/2021)   [place]    Feeling of Stress : Not at all  Social Connections: Unknown (11/27/2021)   Social Connection and Isolation Panel [NHANES]    Frequency of Communication with Friends and Family: Not on file    Frequency of  Social Gatherings with Friends and Family: Not on file    Attends Religious Services: Not on file    Active Member of Clubs or Organizations: Not on file    Attends Archivist Meetings: Not on file    Marital Status: Married  Intimate Partner Violence: Not At Risk (11/12/2022)   Humiliation, Afraid, Rape, and Kick questionnaire    Fear of Current or Ex-Partner: No    Emotionally Abused: No    Physically Abused: No    Sexually Abused: No     Review of Systems    General:  No chills, fever, night sweats or weight changes.  Cardiovascular:  No chest pain, +++ dyspnea on exertion, +++ increased abdominal girth, no edema, orthopnea, palpitations, paroxysmal nocturnal dyspnea. Dermatological: No rash, lesions/masses Respiratory: No cough, +++ dyspnea Urologic: No hematuria, dysuria Abdominal:   +++ Increased abdominal girth, no nausea, vomiting, diarrhea, bright red blood per rectum, melena, or hematemesis Neurologic:  No visual changes, wkns, changes in mental status. All other systems reviewed and are otherwise negative except as noted above.  Physical Exam    Blood pressure 136/83, pulse 69, temperature 97.8 F (36.6 C), resp. rate 18, height 5\' 5"  (1.651 m), weight 55.5 kg, SpO2 99 %.  General: Pleasant, NAD Psych: Normal affect. Neuro: Alert and oriented X 3. Moves all extremities spontaneously. HEENT: Normal  Neck: Supple without bruits or JVD. Lungs:  Resp regular and unlabored, diminished breath sounds bilaterally. Heart: RRR no s3, s4, or murmurs. Abdomen: Soft, non-tender, non-distended, BS + x 4.  Extremities: No clubbing, cyanosis or edema. DP/PT2+, Radials 2+ and equal bilaterally.  Labs    Cardiac Enzymes Recent Labs  Lab 11/12/22 1357 11/12/22 1858  TROPONINIHS 10 9     BNP    Component Value Date/Time   BNP 3,247.0 (H) 11/12/2022 1357    Lab Results  Component Value Date   WBC 8.3 11/14/2022   HGB 7.9 (L) 11/14/2022   HCT 26.1 (L) 11/14/2022    MCV 82.3 11/14/2022  PLT 282 11/14/2022    Recent Labs  Lab 11/14/22 0447 11/14/22 1125  NA  --  134*  K  --  5.2*  CL  --  99  CO2  --  26  BUN  --  47*  CREATININE  --  2.67*  CALCIUM  --  8.8*  PROT 6.1*  --   BILITOT 1.0  --   ALKPHOS 83  --   ALT 20  --   AST 24  --   GLUCOSE  --  96   Lab Results  Component Value Date   CHOL 175 09/24/2022   HDL 87 09/24/2022   LDLCALC 79 09/24/2022   TRIG 46 09/24/2022   Lab Results  Component Value Date   DDIMER 5.96 (H) 09/07/2022      Radiology Studies    US RENAL  Result Date: 11/13/2022 CLINICAL DATA:  Renal dysfunction 08/30/2022 EXAM: RENAL / URINARY TRACT ULTRASOUND COMPLETE COMPARISON:  08/30/2022 FINDINGS: Right Kidney: Renal measurements: 6 x 2.4 x 2.6 cm = volume: 18.67 mL. There is no hydronephrosis. Right kidney is small in size. There is marked increase in cortical echogenicity. Left Kidney: Renal measurements: 8 x 9 x 4.7 x 3.6 cm = volume: 78.44 mL. There is increased cortical echogenicity. There is no hydronephrosis. There is a 1 cm cyst in the upper pole. Bladder: Appears normal for degree of bladder distention. Other: Incidental note is made of right pleural effusion. IMPRESSION: There is no hydronephrosis. There is marked atrophy right kidney. There is increased cortical echogenicity in both kidneys suggesting medical renal disease. There is 1 cm cyst in the left kidney.  Right pleural effusion. Electronically Signed   By: Elmer Picker M.D.   On: 11/13/2022 17:52   DG Chest 2 View  Result Date: 11/13/2022 CLINICAL DATA:  Dyspnea on exertion EXAM: CHEST - 2 VIEW COMPARISON:  09/30/2022 FINDINGS: Small right pleural effusion has slightly enlarged with progressive right basilar atelectasis or infiltrate. New small left pleural effusion is present. Discoid atelectasis within the left mid lung zone. No pneumothorax. Cardiac size is stably enlarged. Mild central pulmonary vascular congestion without overt  pulmonary edema. No acute bone abnormality. IMPRESSION: 1. Central pulmonary vascular congestion and enlarging, small bilateral pleural effusions suggesting early changes of cardiogenic failure. 2. Stable cardiomegaly. Electronically Signed   By: Fidela Salisbury M.D.   On: 11/13/2022 00:14   DG Chest Port 1 View  Result Date: 11/13/2022 CLINICAL DATA:  Dyspnea on exertion EXAM: PORTABLE CHEST 1 VIEW COMPARISON:  11/12/2022 FINDINGS: Stable cardiomegaly. Stable central pulmonary vascular congestion with superimposed perihilar interstitial pulmonary infiltrate most in keeping with mild cardiogenic pulmonary edema. Small bilateral pleural effusions again identified, unchanged. No pneumothorax. Thoracic dextroscoliosis again noted. No acute bone abnormality. IMPRESSION: 1. Stable mild cardiogenic failure with small bilateral pleural effusions. Electronically Signed   By: Fidela Salisbury M.D.   On: 11/13/2022 00:10   DG Chest Port 1 View  Result Date: 11/12/2022 CLINICAL DATA:  Shortness of breath EXAM: PORTABLE CHEST 1 VIEW COMPARISON:  Previous studies including the examination done earlier today FINDINGS: Transverse diameter of heart is increased. Increased markings are seen in right parahilar region and right lower lung field suggesting asymmetric interstitial edema or interstitial pneumonia. Right lung is smaller than left. There are small transverse linear densities in left lower lung field. There is blunting of both lateral CP angles. There is no pneumothorax. IMPRESSION: Cardiomegaly. Increased markings in right parahilar region and right lower lung fields  suggest possible pneumonia. Linear densities in left parahilar region suggest subsegmental atelectasis. Small bilateral pleural effusions. Electronically Signed   By: Elmer Picker M.D.   On: 11/12/2022 16:11    ECG & Cardiac Imaging    RSR, 76, 1st deg avb, no acute ST/T changes - personally reviewed.  Assessment & Plan    1.  Acute on  chronic heart failure with reduced ejection fraction/cardiomyopathy: Patient found to have recurrent LV dysfunction in the setting of respiratory failure and volume overload in February 2024.  It is notable, that the echo was performed during a period of paroxysmal atrial flutter.  Myoview on February 19 was intermediate risk with EF 43%, apical infarct, and mild peri-infarct ischemia.  She has been medically managed in the setting of poor candidacy for diagnostic catheterization related to CKD 4.  She has had worsening dyspnea over the past week and was found to have worsening normocytic anemia with markedly elevated BNP and heart failure on chest x-ray, prompting admission.  She has been transfused x 1 unit with slight increase in H&H, currently 7.9/26.1.  She received 2 doses of intravenous Lasix in the ED on March 19 with subsequent bump in BUN and creatinine to 43 and 2.96 respectively.  Creatinine slightly better today at 2.67.  She does not appear to be markedly volume overloaded on exam but she does complain of ongoing abdominal distention.  Will plan limited echo as she is currently in sinus rhythm, to reevaluate LV function.  Will also plan on right heart catheterization tomorrow to help guide diuretic therapy, especially in the setting of stage IV chronic kidney disease.  Continue to hold off on diuretic therapy for now.  Strongly consider 1 additional unit of packed red blood cells.  Continue Toprol-XL.  No acei/arb/arni/mra in the setting of stage IV chronic kidney disease.  If pressure stable, will consider BiDil.  2.  Acute on chronic normocytic anemia: Status post 1 unit packed red blood cells.  Hemoglobin 7.9 this morning.  Recommend 1 additional unit.  Defer to medicine team.  3.  Paroxysmal atrial fibrillation and flutter: Was noted during last admission.  Currently in sinus rhythm on amiodarone and beta-blocker therapy.  Eliquis on hold in the setting of anemia and would not plan to resume  until after right heart catheterization.  4.  COPD/tobacco abuse: Has not smoked since recent hospitalization in February.  Continues to require oxygen throughout the day.  Management per medicine team.  5.  Essential hypertension: Stable.  6.  Hyperlipidemia: Continue atorvastatin therapy.  7.  Hyperkalemia: Potassium slightly better this morning at 5.2.  8.  Stage IV chronic kidney disease: Acute worsening on admission with slight improvement this morning with creatinine of 2.67.  Nephrology following.  Avoiding nephrotoxic agents.  Risk Assessment/Risk Scores:        New York Heart Association (NYHA) Functional Class NYHA Class III    Signed, Murray Hodgkins, NP 11/14/2022, 1:21 PM  For questions or updates, please contact   Please consult [email or web address] for contact info under Cardiology/STEMI.

## 2022-11-14 NOTE — Progress Notes (Signed)
[facility]  ROUNDING NOTE   Subjective:   Dawn Sherman was admitted to [facility] on 11/12/2022 for Hypoxia [R09.02] Symptomatic anemia [D64.9] Anemia, unspecified type [D64.9] Dyspnea, unspecified type [R06.00]  Patient was last seen by nephrology on 11/20/2021 for her chronic kidney disease by Dr. [name].   Patient sitting up in bed Complains of weakness Remains on 2L [place], baseline 2L nocturnal oxygen  Patient seen later sitting up in chair   Objective:  Vital signs in last 24 hours:  Temp:  [97.4 F (36.3 C)-98 F (36.7 C)] 97.8 F (36.6 C) (03/21 1226) Pulse Rate:  [69-81] 69 (03/21 1226) Resp:  [16-18] 18 (03/21 1226) BP: (121-145)/(70-103) 136/83 (03/21 1226) SpO2:  [77 %-100 %] 99 % (03/21 1226) Weight:  [55.5 kg] 55.5 kg (03/21 0404)  Weight change: 1.87 kg Filed Weights   11/12/22 2155 11/13/22 0712 11/14/22 0404  Weight: 60.2 kg 62.1 kg 55.5 kg    Intake/Output: I/O last 3 completed shifts: In: 1527.7 [P.O.:1200; Blood:224.5; IV Piggyback:103.2] Out: 450 [Urine:450]   Intake/Output this shift:  Total I/O In: 240 [P.O.:240] Out: -   Physical Exam: General: NAD,   Head: Normocephalic, atraumatic. Moist oral mucosal membranes  Eyes: Anicteric  Lungs:  Clear to auscultation  Heart: irregular  Abdomen:  Soft, nontender,   Extremities:  no peripheral edema.  Neurologic: Nonfocal, moving all four extremities  Skin: No lesions  Access: none    Basic Metabolic Panel: Recent Labs  Lab 11/12/22 1125 11/12/22 1357 11/13/22 0428 11/14/22 0447 11/14/22 1125  NA 136 134* 135  --  134*  K 5.5* 5.2* 5.3*  --  5.2*  CL 101 99 103  --  99  CO2 27 25 26   --  26  GLUCOSE 77 141* 123*  --  96  BUN 42* 40* 43*  --  47*  CREATININE 2.76* 2.71* 2.96*  --  2.67*  CALCIUM 8.9 8.7* 8.4*  --  8.8*  MG  --   --   --  2.6*  --   PHOS  --   --   --  4.1  --      Liver Function Tests: Recent Labs  Lab 11/14/22 0447  AST 24  ALT 20  ALKPHOS 83   BILITOT 1.0  PROT 6.1*  ALBUMIN 3.3*   No results for input(s): "LIPASE", "AMYLASE" in the last 168 hours. No results for input(s): "AMMONIA" in the last 168 hours.  CBC: Recent Labs  Lab 11/12/22 1125 11/12/22 1357 11/13/22 0428 11/14/22 1125  WBC 7.5 8.1 9.2 8.3  HGB 7.1* 7.7* 8.0* 7.9*  HCT 23.9* 26.0* 26.5* 26.1*  MCV 82.4 82.3 81.5 82.3  PLT 295 308 284 282     Cardiac Enzymes: No results for input(s): "CKTOTAL", "CKMB", "CKMBINDEX", "TROPONINI" in the last 168 hours.  BNP: Invalid input(s): "POCBNP"  CBG: No results for input(s): "GLUCAP" in the last 168 hours.  Microbiology: Results for orders placed or performed during the hospital encounter of 09/23/22  Resp panel by RT-PCR (RSV, Flu A&B, Covid) Anterior Nasal Swab     Status: None   Collection Time: 09/23/22  5:56 AM   Specimen: Anterior Nasal Swab  Result Value Ref Range Status   SARS Coronavirus 2 by RT PCR NEGATIVE NEGATIVE Final    Comment: (NOTE) SARS-CoV-2 target nucleic acids are NOT DETECTED.  The SARS-CoV-2 RNA is generally detectable in upper respiratory specimens during the acute phase of infection. The lowest concentration of SARS-CoV-2  viral copies this assay can detect is 138 copies/mL. A negative result does not preclude SARS-Cov-2 infection and should not be used as the sole basis for treatment or other patient management decisions. A negative result may occur with  improper specimen collection/handling, submission of specimen other than nasopharyngeal swab, presence of viral mutation(s) within the areas targeted by this assay, and inadequate number of viral copies(<138 copies/mL). A negative result must be combined with clinical observations, patient history, and epidemiological information. The expected result is Negative.  Fact Sheet for Patients:  EntrepreneurPulse.com.au  Fact Sheet for Healthcare Providers:  IncredibleEmployment.be  This  test is no t yet approved or cleared by the [place] FDA and  has been authorized for detection and/or diagnosis of SARS-CoV-2 by FDA under an Emergency Use Authorization (EUA). This EUA will remain  in effect (meaning this test can be used) for the duration of the COVID-19 declaration under Section 564(b)(1) of the Act, 21 U.S.C.section 360bbb-3(b)(1), unless the authorization is terminated  or revoked sooner.       Influenza A by PCR NEGATIVE NEGATIVE Final   Influenza B by PCR NEGATIVE NEGATIVE Final    Comment: (NOTE) The Xpert Xpress SARS-CoV-2/FLU/RSV plus assay is intended as an aid in the diagnosis of influenza from Nasopharyngeal swab specimens and should not be used as a sole basis for treatment. Nasal washings and aspirates are unacceptable for Xpert Xpress SARS-CoV-2/FLU/RSV testing.  Fact Sheet for Patients: EntrepreneurPulse.com.au  Fact Sheet for Healthcare Providers: IncredibleEmployment.be  This test is not yet approved or cleared by the [place] FDA and has been authorized for detection and/or diagnosis of SARS-CoV-2 by FDA under an Emergency Use Authorization (EUA). This EUA will remain in effect (meaning this test can be used) for the duration of the COVID-19 declaration under Section 564(b)(1) of the Act, 21 U.S.C. section 360bbb-3(b)(1), unless the authorization is terminated or revoked.     Resp Syncytial Virus by PCR NEGATIVE NEGATIVE Final    Comment: (NOTE) Fact Sheet for Patients: EntrepreneurPulse.com.au  Fact Sheet for Healthcare Providers: IncredibleEmployment.be  This test is not yet approved or cleared by the [place] FDA and has been authorized for detection and/or diagnosis of SARS-CoV-2 by FDA under an Emergency Use Authorization (EUA). This EUA will remain in effect (meaning this test can be used) for the duration of the COVID-19 declaration under  Section 564(b)(1) of the Act, 21 U.S.C. section 360bbb-3(b)(1), unless the authorization is terminated or revoked.  Performed at [facility], [address] [place], [place] [address]   Blood culture (routine x 2)     Status: None   Collection Time: 09/23/22  7:57 AM   Specimen: BLOOD  Result Value Ref Range Status   Specimen Description BLOOD LEFT ANTECUBITAL  Final   Special Requests   Final    BOTTLES DRAWN AEROBIC AND ANAEROBIC Blood Culture adequate volume   Culture   Final    NO GROWTH 5 DAYS Performed at [facility], [address] [place], [place] [address]    Report Status 09/28/2022 FINAL  Final  Blood culture (routine x 2)     Status: None   Collection Time: 09/23/22  7:57 AM   Specimen: BLOOD  Result Value Ref Range Status   Specimen Description BLOOD BLOOD RIGHT HAND  Final   Special Requests   Final    BOTTLES DRAWN AEROBIC AND ANAEROBIC Blood Culture results may not be optimal due to an inadequate volume of blood received in  culture bottles   Culture   Final    NO GROWTH 5 DAYS Performed at [facility], [address] [place], [place] [address]    Report Status 09/28/2022 FINAL  Final    Coagulation Studies: No results for input(s): "LABPROT", "INR" in the last 72 hours.  Urinalysis: Recent Labs    11/12/22 2349  COLORURINE STRAW*  LABSPEC 1.009  PHURINE 7.0  GLUCOSEU 150*  HGBUR NEGATIVE  BILIRUBINUR NEGATIVE  KETONESUR NEGATIVE  PROTEINUR 100*  NITRITE NEGATIVE  LEUKOCYTESUR NEGATIVE       Imaging: US RENAL  Result Date: 11/13/2022 CLINICAL DATA:  Renal dysfunction 08/30/2022 EXAM: RENAL / URINARY TRACT ULTRASOUND COMPLETE COMPARISON:  08/30/2022 FINDINGS: Right Kidney: Renal measurements: 6 x 2.4 x 2.6 cm = volume: 18.67 mL. There is no hydronephrosis. Right kidney is small in size. There is marked increase in cortical echogenicity. Left Kidney: Renal measurements: 8 x 9 x 4.7 x 3.6 cm = volume: 78.44 mL.  There is increased cortical echogenicity. There is no hydronephrosis. There is a 1 cm cyst in the upper pole. Bladder: Appears normal for degree of bladder distention. Other: Incidental note is made of right pleural effusion. IMPRESSION: There is no hydronephrosis. There is marked atrophy right kidney. There is increased cortical echogenicity in both kidneys suggesting medical renal disease. There is 1 cm cyst in the left kidney.  Right pleural effusion. Electronically Signed   By: Elmer Picker M.D.   On: 11/13/2022 17:52   DG Chest Port 1 View  Result Date: 11/13/2022 CLINICAL DATA:  Dyspnea on exertion EXAM: PORTABLE CHEST 1 VIEW COMPARISON:  11/12/2022 FINDINGS: Stable cardiomegaly. Stable central pulmonary vascular congestion with superimposed perihilar interstitial pulmonary infiltrate most in keeping with mild cardiogenic pulmonary edema. Small bilateral pleural effusions again identified, unchanged. No pneumothorax. Thoracic dextroscoliosis again noted. No acute bone abnormality. IMPRESSION: 1. Stable mild cardiogenic failure with small bilateral pleural effusions. Electronically Signed   By: Fidela Salisbury M.D.   On: 11/13/2022 00:10   DG Chest Port 1 View  Result Date: 11/12/2022 CLINICAL DATA:  Shortness of breath EXAM: PORTABLE CHEST 1 VIEW COMPARISON:  Previous studies including the examination done earlier today FINDINGS: Transverse diameter of heart is increased. Increased markings are seen in right parahilar region and right lower lung field suggesting asymmetric interstitial edema or interstitial pneumonia. Right lung is smaller than left. There are small transverse linear densities in left lower lung field. There is blunting of both lateral CP angles. There is no pneumothorax. IMPRESSION: Cardiomegaly. Increased markings in right parahilar region and right lower lung fields suggest possible pneumonia. Linear densities in left parahilar region suggest subsegmental atelectasis. Small  bilateral pleural effusions. Electronically Signed   By: Elmer Picker M.D.   On: 11/12/2022 16:11     Medications:     amiodarone  200 mg Oral Daily   atorvastatin  40 mg Oral Daily   buPROPion  150 mg Oral Daily   calcium carbonate  1 tablet Oral TID WC   fluticasone furoate-vilanterol  1 puff Inhalation Daily   And   umeclidinium bromide  1 puff Inhalation Daily   loratadine  10 mg Oral Daily   metoprolol succinate  25 mg Oral Daily   pantoprazole  40 mg Oral BID   patiromer  8.4 g Oral Daily   sodium chloride flush  3 mL Intravenous Q12H   venlafaxine XR  150 mg Oral Q breakfast   acetaminophen **OR** acetaminophen, albuterol, clonazepam, hydrALAZINE, ondansetron **  OR** ondansetron (ZOFRAN) IV, senna-docusate  Assessment/ Plan:  Dawn Sherman is a 66 y.o.  female with hypertension, atrial fibrillation, chronic congestive heart failure, atrophic right kidney, endometriosis, hyperlipidemia, tobacco abuse, GERD, and depression who is admitted to [facility] on 11/12/2022 for Hypoxia [R09.02] Symptomatic anemia [D64.9] Anemia, unspecified type [D64.9] Dyspnea, unspecified type [R06.00]  Chronic kidney disease stage IV: with hyperkalemia and proteinuria. Renal function has progressively decreased in the last few months.  - continue patiromer - hold home dapagliflozin -Creatinine slightly improved from yesterday.  -No acute need for dialysis at this time  Hypertension and acute exacerbation of systolic congestive heart failure: on torsemide as outpatient.  - status post IV furosemide - continue metoprolol - Appreciate cardiology input. Recommending continuing to hold diuresis and RHC planned for Friday.   Anemia with chronic kidney disease and iron deficiency: negative for GI bleed.  -IV iron given on 11/13/22 - check electropheresis  Secondary Hyperparathyroidism: with hypocalcemia - PTH pending -Phosphorus 4.1   LOS: 2 Sheniah Supak 3/21/20241:44 PM

## 2022-11-14 NOTE — TOC Progression Note (Signed)
Transition of Care [facility]) - Progression Note    Patient Details  Name: Dawn Sherman MRN: [id number] Date of Birth: 1956/08/05  Transition of Care [facility]) CM/SW [place], RN Phone Number: 11/14/2022, 3:42 PM  Clinical Narrative:    Per Floydene Flock patient is active with  [facility] for RN         Expected Discharge Plan and Services                                               Social Determinants of Health (SDOH) Interventions SDOH Screenings   Food Insecurity: No Food Insecurity (11/12/2022)  Housing: Low Risk  (11/12/2022)  Transportation Needs: No Transportation Needs (11/12/2022)  Utilities: Not At Risk (11/12/2022)  Depression (PHQ2-9): Low Risk  (10/09/2022)  Financial Resource Strain: Low Risk  (11/27/2021)  Physical Activity: Sufficiently Active (11/27/2021)  Social Connections: Unknown (11/27/2021)  Stress: No Stress Concern Present (11/27/2021)  Tobacco Use: Medium Risk (11/14/2022)    Readmission Risk Interventions    07/02/2022   12:26 PM 12/11/2021    3:17 PM  Readmission Risk Prevention Plan  Transportation Screening Complete Complete  PCP or Specialist Appt within 3-5 Days  Complete  HRI or [place]  Complete  Social Work Consult for [place] Planning/Counseling  Complete  Palliative Care Screening  Not Applicable  Medication Review Press photographer) Complete Complete  PCP or Specialist appointment within 3-5 days of discharge Complete   SW Recovery Care/Counseling Consult Complete   [place] Not Applicable

## 2022-11-14 NOTE — Assessment & Plan Note (Signed)
Likely multifactorial in setting of symptomatic acute on chronic anemia and small bilateral pleural effusion Procalcitonin negative.  Patient uses 2-3 L of oxygen at night at home. -Continue supplemental oxygen-wean as tolerated

## 2022-11-15 ENCOUNTER — Encounter: Payer: Self-pay | Admitting: Internal Medicine

## 2022-11-15 ENCOUNTER — Inpatient Hospital Stay: Payer: Medicare Other

## 2022-11-15 ENCOUNTER — Encounter
Admission: EM | Disposition: A | Discharge status: Home Health | Payer: Self-pay | Source: Home / Self Care | Attending: Internal Medicine

## 2022-11-15 DIAGNOSIS — I420 Dilated cardiomyopathy: Secondary | ICD-10-CM | POA: Diagnosis not present

## 2022-11-15 DIAGNOSIS — R0600 Dyspnea, unspecified: Secondary | ICD-10-CM

## 2022-11-15 DIAGNOSIS — I340 Nonrheumatic mitral (valve) insufficiency: Secondary | ICD-10-CM

## 2022-11-15 DIAGNOSIS — E875 Hyperkalemia: Secondary | ICD-10-CM | POA: Diagnosis not present

## 2022-11-15 DIAGNOSIS — R0902 Hypoxemia: Secondary | ICD-10-CM | POA: Diagnosis not present

## 2022-11-15 DIAGNOSIS — D649 Anemia, unspecified: Secondary | ICD-10-CM | POA: Diagnosis not present

## 2022-11-15 DIAGNOSIS — I5043 Acute on chronic combined systolic (congestive) and diastolic (congestive) heart failure: Secondary | ICD-10-CM | POA: Diagnosis not present

## 2022-11-15 DIAGNOSIS — I4819 Other persistent atrial fibrillation: Secondary | ICD-10-CM | POA: Diagnosis not present

## 2022-11-15 DIAGNOSIS — I480 Paroxysmal atrial fibrillation: Secondary | ICD-10-CM | POA: Diagnosis not present

## 2022-11-15 HISTORY — PX: RIGHT HEART CATH: CATH118263

## 2022-11-15 HISTORY — PX: CENTRAL LINE INSERTION: CATH118232

## 2022-11-15 LAB — CBC
HCT: 28.3 % — ABNORMAL LOW (ref 36.0–46.0)
Hemoglobin: 8.1 g/dL — ABNORMAL LOW (ref 12.0–15.0)
MCH: 24.5 pg — ABNORMAL LOW (ref 26.0–34.0)
MCHC: 28.6 g/dL — ABNORMAL LOW (ref 30.0–36.0)
MCV: 85.5 fL (ref 80.0–100.0)
Platelets: 270 10*3/uL (ref 150–400)
RBC: 3.31 MIL/uL — ABNORMAL LOW (ref 3.87–5.11)
RDW: 17.5 % — ABNORMAL HIGH (ref 11.5–15.5)
WBC: 9 10*3/uL (ref 4.0–10.5)
nRBC: 0.2 % (ref 0.0–0.2)

## 2022-11-15 LAB — KAPPA/LAMBDA LIGHT CHAINS
Kappa free light chain: 31.2 mg/L — ABNORMAL HIGH (ref 3.3–19.4)
Kappa, lambda light chain ratio: 1.12 (ref 0.26–1.65)
Lambda free light chains: 27.8 mg/L — ABNORMAL HIGH (ref 5.7–26.3)

## 2022-11-15 LAB — POCT I-STAT EG7
Acid-base deficit: 3 mmol/L — ABNORMAL HIGH (ref 0.0–2.0)
Acid-base deficit: 3 mmol/L — ABNORMAL HIGH (ref 0.0–2.0)
Bicarbonate: 23.9 mmol/L (ref 20.0–28.0)
Bicarbonate: 24 mmol/L (ref 20.0–28.0)
Calcium, Ion: 1.27 mmol/L (ref 1.15–1.40)
Calcium, Ion: 1.31 mmol/L (ref 1.15–1.40)
HCT: 24 % — ABNORMAL LOW (ref 36.0–46.0)
HCT: 24 % — ABNORMAL LOW (ref 36.0–46.0)
Hemoglobin: 8.2 g/dL — ABNORMAL LOW (ref 12.0–15.0)
Hemoglobin: 8.2 g/dL — ABNORMAL LOW (ref 12.0–15.0)
O2 Saturation: 29 %
O2 Saturation: 32 %
Potassium: 5.1 mmol/L (ref 3.5–5.1)
Potassium: 5.1 mmol/L (ref 3.5–5.1)
Sodium: 136 mmol/L (ref 135–145)
Sodium: 137 mmol/L (ref 135–145)
TCO2: 25 mmol/L (ref 22–32)
TCO2: 26 mmol/L (ref 22–32)
pCO2, Ven: 49.9 mmHg (ref 44–60)
pCO2, Ven: 50.9 mmHg (ref 44–60)
pH, Ven: 7.282 (ref 7.25–7.43)
pH, Ven: 7.288 (ref 7.25–7.43)
pO2, Ven: 22 mmHg — CL (ref 32–45)
pO2, Ven: 22 mmHg — CL (ref 32–45)

## 2022-11-15 LAB — LACTIC ACID, PLASMA: Lactic Acid, Venous: 0.7 mmol/L (ref 0.5–1.9)

## 2022-11-15 LAB — GLUCOSE, CAPILLARY
Glucose-Capillary: 87 mg/dL (ref 70–99)
Glucose-Capillary: 93 mg/dL (ref 70–99)

## 2022-11-15 LAB — BASIC METABOLIC PANEL
Anion gap: 14 (ref 5–15)
BUN: 54 mg/dL — ABNORMAL HIGH (ref 8–23)
CO2: 25 mmol/L (ref 22–32)
Calcium: 9.5 mg/dL (ref 8.9–10.3)
Chloride: 100 mmol/L (ref 98–111)
Creatinine, Ser: 2.75 mg/dL — ABNORMAL HIGH (ref 0.44–1.00)
GFR, Estimated: 18 mL/min — ABNORMAL LOW (ref >60)
Glucose, Bld: 104 mg/dL — ABNORMAL HIGH (ref 70–99)
Potassium: 5.4 mmol/L — ABNORMAL HIGH (ref 3.5–5.1)
Sodium: 139 mmol/L (ref 135–145)

## 2022-11-15 LAB — MRSA NEXT GEN BY PCR, NASAL: MRSA by PCR Next Gen: NOT DETECTED

## 2022-11-15 LAB — COOXEMETRY PANEL
Carboxyhemoglobin: 0.9 % (ref 0.5–1.5)
Methemoglobin: <0.7 % (ref 0.0–1.5)
O2 Saturation: 49 %
Total hemoglobin: 7.6 g/dL — ABNORMAL LOW (ref 12.0–16.0)
Total oxygen content: 48.6 %

## 2022-11-15 LAB — PARATHYROID HORMONE, INTACT (NO CA): PTH: 92 pg/mL — ABNORMAL HIGH (ref 15–65)

## 2022-11-15 SURGERY — RIGHT HEART CATH
Anesthesia: Moderate Sedation

## 2022-11-15 MED ORDER — SODIUM CHLORIDE 0.9% FLUSH: 10.0000 mL | INTRAVENOUS | Status: DC | PRN

## 2022-11-15 MED ORDER — SODIUM CHLORIDE 0.9% FLUSH
3.0000 mL | INTRAVENOUS | Status: DC | PRN
  Administered 2022-11-15 – 2022-11-21 (×5): 3 mL via INTRAVENOUS

## 2022-11-15 MED ORDER — PATIROMER SORBITEX CALCIUM 8.4 G PO PACK
16.8000 g | PACK | Freq: Every day | ORAL | Status: DC
  Filled 2022-11-15: qty 2

## 2022-11-15 MED ORDER — FUROSEMIDE 10 MG/ML IJ SOLN
5.0000 mg/h | INTRAVENOUS | Status: DC
  Administered 2022-11-15: 6 mg/h via INTRAVENOUS
  Filled 2022-11-15 (×2): qty 20

## 2022-11-15 MED ORDER — HEPARIN SODIUM (PORCINE) 5000 UNIT/ML IJ SOLN
5000.0000 [IU] | Freq: Three times a day (TID) | INTRAMUSCULAR | Status: DC
  Administered 2022-11-16: 5000 [IU] via SUBCUTANEOUS
  Filled 2022-11-15 (×2): qty 1

## 2022-11-15 MED ORDER — CHLORHEXIDINE GLUCONATE CLOTH 2 % EX PADS
6.0000 | MEDICATED_PAD | Freq: Every day | CUTANEOUS | Status: DC
  Administered 2022-11-16 – 2022-11-20 (×5): 6 via TOPICAL

## 2022-11-15 MED ORDER — SODIUM CHLORIDE 0.9% FLUSH
3.0000 mL | Freq: Two times a day (BID) | INTRAVENOUS | Status: DC
  Administered 2022-11-15 – 2022-11-26 (×19): 3 mL via INTRAVENOUS

## 2022-11-15 MED ORDER — HYDRALAZINE HCL 20 MG/ML IJ SOLN: 5.0000 mg | INTRAMUSCULAR | Status: AC | PRN

## 2022-11-15 MED ORDER — SODIUM CHLORIDE 0.9 % IV SOLN: 250.0000 mL | INTRAVENOUS | Status: DC | PRN

## 2022-11-15 MED ORDER — CHLORHEXIDINE GLUCONATE CLOTH 2 % EX PADS
6.0000 | MEDICATED_PAD | Freq: Every day | CUTANEOUS | Status: DC
  Administered 2022-11-15 – 2022-11-17 (×3): 6 via TOPICAL

## 2022-11-15 MED ORDER — HEPARIN (PORCINE) IN NACL 1000-0.9 UT/500ML-% IV SOLN
INTRAVENOUS | Status: AC
  Filled 2022-11-15: qty 500

## 2022-11-15 MED ORDER — FUROSEMIDE 10 MG/ML IJ SOLN: 40.0000 mg | Freq: Two times a day (BID) | INTRAMUSCULAR | Status: DC

## 2022-11-15 MED ORDER — HEPARIN (PORCINE) IN NACL 1000-0.9 UT/500ML-% IV SOLN
INTRAVENOUS | Status: DC | PRN
  Administered 2022-11-15 (×2): 500 mL

## 2022-11-15 MED ORDER — SODIUM CHLORIDE 0.9% FLUSH
10.0000 mL | Freq: Two times a day (BID) | INTRAVENOUS | Status: DC
  Administered 2022-11-15 – 2022-11-26 (×17): 10 mL

## 2022-11-15 MED ORDER — MILRINONE LACTATE IN DEXTROSE 20-5 MG/100ML-% IV SOLN
0.1250 ug/kg/min | INTRAVENOUS | Status: DC
  Administered 2022-11-15 – 2022-11-18 (×4): 0.25 ug/kg/min via INTRAVENOUS
  Filled 2022-11-15 (×4): qty 100

## 2022-11-15 MED ORDER — PATIROMER SORBITEX CALCIUM 8.4 G PO PACK
8.4000 g | PACK | Freq: Every day | ORAL | Status: DC
  Filled 2022-11-15: qty 1

## 2022-11-15 MED ORDER — METOLAZONE 2.5 MG PO TABS
2.5000 mg | ORAL_TABLET | Freq: Once | ORAL | Status: AC
  Administered 2022-11-15: 2.5 mg via ORAL
  Filled 2022-11-15: qty 1

## 2022-11-15 SURGICAL SUPPLY — 8 items
CANNULA 5F STIFF (CANNULA) IMPLANT
CATH BALLN WEDGE 5F 110CM (CATHETERS) IMPLANT
DRAPE BRACHIAL (DRAPES) IMPLANT
KIT CV MULTILUMEN 7FR 20 (SET/KITS/TRAYS/PACK) ×2
KIT CV MULTILUMEN 7FR 20 SUB (SET/KITS/TRAYS/PACK) IMPLANT
PACK CARDIAC CATH (CUSTOM PROCEDURE TRAY) ×2 IMPLANT
SET ATX-X65L (MISCELLANEOUS) IMPLANT
SHEATH AVANTI 5FR X 11CM (SHEATH) IMPLANT

## 2022-11-15 NOTE — Interval H&P Note (Signed)
History and Physical Interval Note:  11/15/2022 12:08 PM  Dawn Sherman  has presented today for surgery, with the diagnosis of acute on chronic heart failure with reduced ejection fraction.  The various methods of treatment have been discussed with the patient and family. After consideration of risks, benefits and other options for treatment, the patient has consented to  Procedure(s): RIGHT HEART CATH (N/A) as a surgical intervention.  The patient's history has been reviewed, patient examined, no change in status, stable for surgery.  I have reviewed the patient's chart and labs.  Questions were answered to the patient's satisfaction.     [name]

## 2022-11-15 NOTE — Progress Notes (Signed)
Rounding Note    Patient Name: Dawn Sherman Date of Encounter: 11/15/2022  [place] Cardiologist: Ida Rogue, MD   Subjective   Reports shortness of breath off oxygen with ambulation At rest in bed with oxygen, is more comfortable Echocardiogram results discussed with her, continued decline in ejection fraction and global hypokinesis EF 25% Skin of her right heart catheterization later this morning  Inpatient Medications    Scheduled Meds:  [MAR Hold] amiodarone  200 mg Oral Daily   [MAR Hold] atorvastatin  40 mg Oral Daily   [MAR Hold] buPROPion  150 mg Oral Daily   [MAR Hold] calcium carbonate  1 tablet Oral TID WC   [MAR Hold] fluticasone furoate-vilanterol  1 puff Inhalation Daily   And   [MAR Hold] umeclidinium bromide  1 puff Inhalation Daily   [MAR Hold] loratadine  10 mg Oral Daily   [MAR Hold] metoprolol succinate  25 mg Oral Daily   [MAR Hold] patiromer  16.8 g Oral Daily   [MAR Hold] sodium chloride flush  3 mL Intravenous Q12H   [MAR Hold] venlafaxine XR  150 mg Oral Q breakfast   Continuous Infusions:  sodium chloride     sodium chloride 10 mL/hr at 11/15/22 1132   PRN Meds: sodium chloride, [MAR Hold] acetaminophen **OR** [MAR Hold] acetaminophen, [MAR Hold] albuterol, [MAR Hold] clonazepam, Heparin (Porcine) in NaCl, [MAR Hold] hydrALAZINE, [MAR Hold] ondansetron **OR** [MAR Hold] ondansetron (ZOFRAN) IV, [MAR Hold] senna-docusate, sodium chloride flush   Vital Signs    Vitals:   11/15/22 1241 11/15/22 1246 11/15/22 1251 11/15/22 1256  BP: 137/81 (!) 141/79 (!) 144/75 (!) 142/79  Pulse: 76 76 75 76  Resp: 20 20 (!) 23 (!) 22  Temp:      TempSrc:      SpO2: 93% 91% (!) 88% (!) 87%  Weight:      Height:        Intake/Output Summary (Last 24 hours) at 11/15/2022 1321 Last data filed at 11/14/2022 2300 Gross per 24 hour  Intake 720 ml  Output --  Net 720 ml      11/15/2022   11:34 AM 11/15/2022    4:46 AM 11/14/2022    4:04 AM   Last 3 Weights  Weight (lbs) 134 lb 0.6 oz 134 lb 0.6 oz 122 lb 5.7 oz  Weight (kg) 60.8 kg 60.8 kg 55.5 kg      Telemetry    Normal sinus rhythm- Personally Reviewed  ECG     - Personally Reviewed  Physical Exam   GEN: No acute distress.   Neck: No JVD Cardiac: RRR, no murmurs, rubs, or gallops.  Respiratory: Clear to auscultation bilaterally. GI: Soft, nontender, non-distended  MS: No edema; No deformity. Neuro:  Nonfocal  Psych: Normal affect   Labs    High Sensitivity Troponin:   Recent Labs  Lab 11/12/22 1357 11/12/22 1858  TROPONINIHS 10 9     Chemistry Recent Labs  Lab 11/13/22 0428 11/14/22 0447 11/14/22 1125 11/15/22 0425 11/15/22 1231 11/15/22 1235  NA 135  --  134* 139 136 137  K 5.3*  --  5.2* 5.4* 5.1 5.1  CL 103  --  99 100  --   --   CO2 26  --  26 25  --   --   GLUCOSE 123*  --  96 104*  --   --   BUN 43*  --  47* 54*  --   --  CREATININE 2.96*  --  2.67* 2.75*  --   --   CALCIUM 8.4*  --  8.8* 9.5  --   --   MG  --  2.6*  --   --   --   --   PROT  --  6.1*  --   --   --   --   ALBUMIN  --  3.3*  --   --   --   --   AST  --  24  --   --   --   --   ALT  --  20  --   --   --   --   ALKPHOS  --  83  --   --   --   --   BILITOT  --  1.0  --   --   --   --   GFRNONAA 17*  --  19* 18*  --   --   ANIONGAP 6  --  9 14  --   --     Lipids No results for input(s): "CHOL", "TRIG", "HDL", "LABVLDL", "LDLCALC", "CHOLHDL" in the last 168 hours.  Hematology Recent Labs  Lab 11/12/22 1357 11/12/22 1701 11/13/22 0428 11/14/22 1125 11/15/22 1231 11/15/22 1235  WBC 8.1  --  9.2 8.3  --   --   RBC 3.16* 3.01* 3.25* 3.17*  --   --   HGB 7.7*  --  8.0* 7.9* 8.2* 8.2*  HCT 26.0*  --  26.5* 26.1* 24.0* 24.0*  MCV 82.3  --  81.5 82.3  --   --   MCH 24.4*  --  24.6* 24.9*  --   --   MCHC 29.6*  --  30.2 30.3  --   --   RDW 17.1*  --  17.0* 17.1*  --   --   PLT 308  --  284 282  --   --    Thyroid No results for input(s): "TSH", "FREET4" in the  last 168 hours.  BNP Recent Labs  Lab 11/12/22 1125 11/12/22 1357  BNP 3,619.9* 3,247.0*    DDimer No results for input(s): "DDIMER" in the last 168 hours.   Radiology    ECHOCARDIOGRAM LIMITED  Result Date: 11/14/2022    ECHOCARDIOGRAM LIMITED REPORT   Patient Name:   Dawn Sherman Date of Exam: 11/14/2022 Medical Rec #:  [id number]     Height:       65.0 in Accession #:    [id number]    Weight:       122.4 lb Date of Birth:  June 27, 1956    BSA:          1.605 m Patient Age:    14 years      BP:           137/77 mmHg Patient Gender: F             HR:           74 bpm. Exam Location:  [facility] Procedure: 2D Echo, Cardiac Doppler and Color Doppler Indications:     Cardiomyopathy -unspecified I42.9  History:         Patient has prior history of Echocardiogram examinations, most                  recent 09/28/2022.  Sonographer:     Sherrie Sport Referring Phys:  [place] Phys: Ida Rogue MD  Sonographer Comments: Image quality was  good. IMPRESSIONS  1. Left ventricular ejection fraction, by estimation, is 25 to 30%. The left ventricle has severely decreased function. The left ventricle demonstrates global hypokinesis. There is moderate left ventricular hypertrophy. Left ventricular diastolic parameters are indeterminate.  2. Right ventricular systolic function is moderately reduced. The right ventricular size is normal. There is moderately elevated pulmonary artery systolic pressure. The estimated right ventricular systolic pressure is [id number] mmHg.  3. Left atrial size was severely dilated.  4. A small pericardial effusion is present.  5. The mitral valve is normal in structure. Severe mitral valve regurgitation. No evidence of mitral stenosis.  6. Tricuspid valve regurgitation is moderate to severe.  7. The aortic valve is normal in structure. Aortic valve regurgitation is not visualized. No aortic stenosis is present.  8. The inferior vena cava is normal in size with greater than 50%  respiratory variability, suggesting right atrial pressure of 3 mmHg. FINDINGS  Left Ventricle: Left ventricular ejection fraction, by estimation, is 25 to 30%. The left ventricle has severely decreased function. The left ventricle demonstrates global hypokinesis. The left ventricular internal cavity size was normal in size. There is moderate left ventricular hypertrophy. Left ventricular diastolic parameters are indeterminate. Right Ventricle: The right ventricular size is normal. No increase in right ventricular wall thickness. Right ventricular systolic function is moderately reduced. There is moderately elevated pulmonary artery systolic pressure. The tricuspid regurgitant velocity is 3.19 m/s, and with an assumed right atrial pressure of 5 mmHg, the estimated right ventricular systolic pressure is [id number] mmHg. Left Atrium: Left atrial size was severely dilated. Right Atrium: Right atrial size was normal in size. Pericardium: A small pericardial effusion is present. Mitral Valve: The mitral valve is normal in structure. There is mild calcification of the mitral valve leaflet(s). Mild mitral annular calcification. Severe mitral valve regurgitation. No evidence of mitral valve stenosis. Tricuspid Valve: The tricuspid valve is normal in structure. Tricuspid valve regurgitation is moderate to severe. No evidence of tricuspid stenosis. Aortic Valve: The aortic valve is normal in structure. Aortic valve regurgitation is not visualized. No aortic stenosis is present. Pulmonic Valve: The pulmonic valve was normal in structure. Pulmonic valve regurgitation is mild. No evidence of pulmonic stenosis. Aorta: The aortic root is normal in size and structure. Venous: The inferior vena cava is normal in size with greater than 50% respiratory variability, suggesting right atrial pressure of 3 mmHg. IAS/Shunts: No atrial level shunt detected by color flow Doppler. Additional Comments: Spectral Doppler performed. Color Doppler  performed.  LEFT VENTRICLE PLAX 2D LVIDd:         5.80 cm LVIDs:         4.70 cm LV PW:         1.20 cm LV IVS:        1.20 cm  LV Volumes (MOD) LV vol d, MOD A2C: 159.0 ml LV vol d, MOD A4C: 127.0 ml LV vol s, MOD A2C: 119.0 ml LV vol s, MOD A4C: 107.0 ml LV SV MOD A2C:     40.0 ml LV SV MOD A4C:     127.0 ml LV SV MOD BP:      35.0 ml RIGHT VENTRICLE RV Basal diam:  3.00 cm RV Mid diam:    2.30 cm LEFT ATRIUM            Index LA Vol (A2C): 130.0 ml 80.98 ml/m LA Vol (A4C): 57.0 ml  35.51 ml/m  TRICUSPID VALVE TR Peak grad:   40.7 mmHg TR Vmax:  319.00 cm/s Ida Rogue MD Electronically signed by Ida Rogue MD Signature Date/Time: 11/14/2022/5:02:08 PM    Final (Updated)    US RENAL  Result Date: 11/13/2022 CLINICAL DATA:  Renal dysfunction 08/30/2022 EXAM: RENAL / URINARY TRACT ULTRASOUND COMPLETE COMPARISON:  08/30/2022 FINDINGS: Right Kidney: Renal measurements: 6 x 2.4 x 2.6 cm = volume: 18.67 mL. There is no hydronephrosis. Right kidney is small in size. There is marked increase in cortical echogenicity. Left Kidney: Renal measurements: 8 x 9 x 4.7 x 3.6 cm = volume: 78.44 mL. There is increased cortical echogenicity. There is no hydronephrosis. There is a 1 cm cyst in the upper pole. Bladder: Appears normal for degree of bladder distention. Other: Incidental note is made of right pleural effusion. IMPRESSION: There is no hydronephrosis. There is marked atrophy right kidney. There is increased cortical echogenicity in both kidneys suggesting medical renal disease. There is 1 cm cyst in the left kidney.  Right pleural effusion. Electronically Signed   By: Elmer Picker M.D.   On: 11/13/2022 17:52    Cardiac Studies   Echo . Left ventricular ejection fraction, by estimation, is 25 to 30%. The  left ventricle has severely decreased function. The left ventricle  demonstrates global hypokinesis. There is moderate left ventricular  hypertrophy. Left ventricular diastolic  parameters  are indeterminate.   2. Right ventricular systolic function is moderately reduced. The right  ventricular size is normal. There is moderately elevated pulmonary artery  systolic pressure. The estimated right ventricular systolic pressure is  [id number] mmHg.   3. Left atrial size was severely dilated.   4. A small pericardial effusion is present.   5. The mitral valve is normal in structure. Severe mitral valve  regurgitation. No evidence of mitral stenosis.   6. Tricuspid valve regurgitation is moderate to severe.   7. The aortic valve is normal in structure. Aortic valve regurgitation is  not visualized. No aortic stenosis is present.   8. The inferior vena cava is normal in size with greater than 50%  respiratory variability, suggesting right atrial pressure of 3 mmHg.   Patient Profile     [name] is a 67 year old woman with history of smoking, COPD, paroxysmal atrial fibrillation/flutter, moderate MR, chronic kidney disease stage IV, cardiomyopathy ejection fraction 30 to 35% in the setting of atrial fibrillation/flutter on Eliquis who presents to the hospital via cardiology clinic for worsening shortness of breath, anemia   Assessment & Plan    A/P: Acute on chronic diastolic and systolic CHF Moderately reduced ejection fraction in the setting of atrial fibrillation/flutter on limited echo February 3rd 2024 Recent stress test with fixed defect, no significant ischemia Unable to perform cardiac catheterization in the setting of stage IV chronic kidney disease -Denies anginal symptoms Limited echocardiogram performed yesterday in normal sinus rhythm with further decline in ejection fraction now 25%, severe MR Plan for right heart catheterization today Addendum: Right heart catheterization numbers as below ..RA 21, PA 63/38, PCWP 34. CO 3.4, CI 2.0, SpO2 30%.  --- Will place in the ICU, start milrinone infusion, Lasix infusion   Acute on chronic anemia In the setting of renal  failure Received 1 unit, hemoglobin 7.9 No CBC today full review   Paroxysmal atrial fibrillation/flutter Currently normal sinus rhythm on low-dose amiodarone, beta-blocker Eliquis on hold for now, could start on heparin infusion in case left heart catheterization can be performed next week   Chronic kidney disease stage IV Hyperkalemia, anemia Nephrology following   Long  smoking history, COPD Cessation recommended, likely underlying COPD   Total encounter time more than 50 minutes  Greater than 50% was spent in counseling and coordination of care with the patient   For questions or updates, please contact [place] Please consult [email or web address] for contact info under        Signed, Ida Rogue, MD  11/15/2022, 1:21 PM

## 2022-11-15 NOTE — Care Management Important Message (Signed)
Important Message  Patient Details  Name: Dawn Sherman MRN: [id number] Date of Birth: 01/02/1956   Medicare Important Message Given:  Yes     [name] 11/15/2022, 11:46 AM

## 2022-11-15 NOTE — Consult Note (Signed)
Dawn Sherman NOTE  Pharmacy Consult for Milrinone Indication: Acute decompensated heart failure  Patient Measurements: Height: 5\' 5"  (165.1 cm) Weight: 60.8 kg (134 lb 0.6 oz) IBW/kg (Calculated) : 57  Intake/Output from previous day: 03/21 0701 - 03/22 0700 In: 960 [P.O.:960] Out: -  Intake/Output from this shift: No intake/output data recorded.  Labs: Recent Labs    11/12/22 1357 11/13/22 0428 11/14/22 0447 11/14/22 1125 11/15/22 0425 11/15/22 1231 11/15/22 1235  WBC 8.1 9.2  --  8.3  --   --   --   HGB 7.7* 8.0*  --  7.9*  --  8.2* 8.2*  HCT 26.0* 26.5*  --  26.1*  --  24.0* 24.0*  PLT 308 284  --  282  --   --   --   CREATININE 2.71* 2.96*  --  2.67* 2.75*  --   --   MG  --   --  2.6*  --   --   --   --   PHOS  --   --  4.1  --   --   --   --   ALBUMIN  --   --  3.3*  --   --   --   --   PROT  --   --  6.1*  --   --   --   --   AST  --   --  24  --   --   --   --   ALT  --   --  20  --   --   --   --   ALKPHOS  --   --  83  --   --   --   --   BILITOT  --   --  1.0  --   --   --   --   BILIDIR  --   --  <0.1  --   --   --   --   IBILI  --   --  NOT CALCULATED  --   --   --   --    Estimated Creatinine Clearance: 17.9 mL/min (A) (by C-G formula based on SCr of 2.75 mg/dL (H)).   Microbiology: No results found for this or any previous visit (from the past 720 hour(s)).  Medical History: Past Medical History:  Diagnosis Date   B12 deficiency    Cardiomyopathy ([place])    a. 09/2022 Echo: EF 35-40%; b. 09/2022 MV: apical defect w/ mild peri-infarct ischemia. EF 43%.   Chronic HFrEF (heart failure with reduced ejection fraction) ([place])    a. 01/2020 Echo: EF 30-35%; b. 07/2020 Echo: EF 50-55%; c. 06/2022 Echo: EF 50-55%; d. 09/2022 Echo: EF 35-40%, globh HK, mod reduced RV fxn, sev BAE, mod MR.   CKD (chronic kidney disease), stage IV (HCC)    COPD (chronic obstructive pulmonary disease) (HCC)    Depression    Endometriosis    Hypertension    Mixed  hyperlipidemia    Moderate mitral regurgitation    Normocytic anemia    PAF (paroxysmal atrial fibrillation) (HCC)    a. CHA2DS2VASc = 4-->eliquis 2.5 bid.   Paroxymsal Atrial flutter (HCC)    Tobacco abuse     Assessment: Patient is a 67 y/o F with medical history as above and including HFrEF (EF 25-30%), Afib, CKD stage IV admitted with symptomatic anemia, acute respiratory failure, acute on chronic CHF. Patient underwent [place] 3/22 with findings of severely elevated  left heart, right heart, and pulmonary artery pressures. Plan is for patient to transfer to stepdown and to initiate milrinone.  Plan:  --Milrinone 0.25 mcg/kg/min and Lasix gtt at 6 mg/hr per cardiology --Continue to monitor electrolytes (on Patiromer in addition to Lasix) --Monitor renal function, trending up --Monitor for arrhthymias   Dawn Sherman 11/15/2022,1:40 PM

## 2022-11-15 NOTE — Progress Notes (Signed)
Progress Note   Patient: VERNIDA POWNELL [id number] DOB: 1955/11/05 DOA: 11/12/2022     3 DOS: the patient was seen and examined on 11/15/2022   Brief hospital course: Ms. Latoi Draffen is a 67 year old female with history of heart failure preserved ejection fraction, paroxysmal atrial fibrillation on low-dose Eliquis, history of subarachnoid hemorrhage secondary to mechanical fall, CKD stage IV, COPD, hypertension, who presents to emergency department from cardiology outpatient clinic for chief concerns of symptomatic anemia.  Vitals in the ED showed temperature of 97.8, respiration rate 18, heart rate 66, blood pressure 116/66, SpO2 of 97% on room air.  Serum sodium is 134, potassium 5.2, chloride 99, bicarb 25, BUN 40, serum creatinine of 2.71, nonfasting blood glucose 131, WBC 8.1, hemoglobin 7.7, platelets of 308.  eGFR 19.  BNP elevated at 3247.  High sensitive troponin has been ordered and is in process.  ED treatment: None  3/20: Vital stable on 4 L of oxygen this morning.  Apparently patient developed shortness of breath with flushing and muscle aches while getting blood transfusion which was discontinued at that time UA and transfusion reaction labs were negative.  Chest x-ray with pulmonary vascular congestion s/p IV Lasix and Benadryl.  CBC this morning with hemoglobin of 8.  Anemia panel with iron deficiency.  IV iron supplement ordered. BMP with slight worsening of creatinine to 2.96.  And potassium of 5.3.  Nephrology was also consulted.  3/21: Vitals and labs seems stable.  Remained on 3 L of oxygen.  Cardiology was also consulted and patient will be going for right heart catheterization tomorrow.  3/22: Vital stable, renal function continued to get worse slowly, persistent hyperkalemia with potassium at 5.4 despite getting Veltassa - dose was increased today. Echocardiogram with worsening of EF and right heart catheterization today with significantly elevated pressures.  Patient  was started on milrinone and Lasix infusion by cardiology.  Assessment and Plan: * Acute on chronic combined systolic and diastolic CHF (congestive heart failure) (HCC) Repeat echo done yesterday with worsening of EF, right heart cath today with significantly elevated pressures. Patient is being transferred to ICU to start milrinone and Lasix infusion. -Continue with strict intake and output -Daily BMP and weight  Acute hypoxemic respiratory failure (HCC) Likely multifactorial in setting of symptomatic acute on chronic anemia and small bilateral pleural effusion Procalcitonin negative.  Patient uses 2-3 L of oxygen at night at home. -Continue supplemental oxygen-wean as tolerated  Symptomatic anemia Patient's baseline hemoglobin over the last 2 months has been 9.5-10.6 S/p partial 1 unit of PRBC as she developed worsening shortness of breath while getting transfusion which was discontinued, patient also received IV Lasix overnight.  Hemoglobin with another decrease to 7.6.  Anemia panel with some iron deficiency and also consistent with anemia of chronic disease. Denies any obvious bleeding, patient was on Eliquis at home which is being held. Also received IV iron supplement -Check FOBT-negative -Monitor hemoglobin  Persistent atrial fibrillation (HCC) Amiodarone 200 mg daily resumed Eliquis 2.5 mg p.o. twice daily not resumed on admission due to acute on chronic anemia, symptomatic anemia We will resume home Eliquis from tomorrow as there is no obvious bleeding. Check FOBT  Hyperkalemia Mild hyperkalemia with CKD stage IV .  Potassium at 5.4 -Continue Veltassa -Patient is also been started on Lasix infusion -Monitors potassium  CKD (chronic kidney disease), stage IV ([place]) Patient with slight worsening of renal function, creatinine at 2.75. UA with protein urea and BMP with mild hyperkalemia. -Nephrology consult -  Monitor renal function-patient is being started on Lasix  infusion -Avoid nephrotoxins  HLD (hyperlipidemia) Atorvastatin 40 mg daily resumed  Tobacco use Patient reports that she has quit, her last cigarette was 1.5 months ago  Essential hypertension Metoprolol succinate 25 mg daily resumed Hydralazine 5 mg IV every 8 hours as needed for SBP greater than 175, 4 days ordered  Dyslipidemia Atorvastatin 40 mg daily resumed  Depression, major, single episode, mild (HCC) Bupropion 150 mg and venlafaxine 150 mg daily resumed  GERD without esophagitis PPI resumed   Subjective: Patient continued to feel short of breath.  Awaiting right heart catheterization.  Husband at bedside.  Physical Exam: Vitals:   11/15/22 1330 11/15/22 1348 11/15/22 1414 11/15/22 1430  BP: (!) 144/82 129/71 138/76 (!) 130/91  Pulse: 75 78 75 74  Resp: (!) 21 (!) 22 20 (!) 22  Temp:   97.9 F (36.6 C)   TempSrc:   Oral   SpO2: (!) 88% 90% 93% 95%  Weight:   75.7 kg   Height:       General.  Ill-appearing lady, in no acute distress. Pulmonary.  Coarse breath sounds bilaterally, normal respiratory effort. CV.  Regular rate and rhythm, no JVD, rub or murmur. Abdomen.  Soft, nontender, nondistended, BS positive. CNS.  Alert and oriented .  No focal neurologic deficit. Extremities.  No edema, no cyanosis, pulses intact and symmetrical. Psychiatry.  Judgment and insight appears normal.    Data Reviewed: Prior data reviewed  Family Communication: Discussed with husband at bedside  Disposition: Status is: Inpatient Remains inpatient appropriate because: Severity of illness  Planned Discharge Destination: Home  Time spent: 50 minutes  This record has been created using Systems analyst. Errors have been sought and corrected,but may not always be located. Such creation errors do not reflect on the standard of care.   Author: Lorella Nimrod, MD 11/15/2022 2:58 PM  For on call review [email or web address].

## 2022-11-15 NOTE — Assessment & Plan Note (Signed)
Patient with slowly improving renal function now, creatinine at 2.91 today. UA with protein urea  -Nephrology on board. -Monitor renal function-patient is being started on Lasix infusion-holding today -Avoid nephrotoxins

## 2022-11-15 NOTE — Consult Note (Signed)
ADVANCED HEART FAILURE CONSULT NOTE  Referring Physician: No ref. provider found  Primary Care: [name], MD Primary Cardiologist: Dr. Rockey Situ  HPI: Dawn Sherman is a 67 y.o. female with history of heart failure with preserved ejection fraction, CKD 3-4, COPD, hypertension, paroxysmal atrial fibrillation and obstructive sleep apnea currently admitted from cardiology clinic to the [facility] ICU for cardiogenic shock secondary to acute systolic heart failure.  Echocardiograms from 2023 demonstrated preserved LV function, however repeat echo from September 28, 2022 demonstrates a reduction in EF to 35 to 40% with at least moderate mitral regurgitation.  This decline in EF was concomitant with hospitalization for pneumonia.  As a follow-up she had a Lexiscan that demonstrated a predominantly fixed defect in the mid to distal anterior wall.  She was seen in cardiology clinic on March 19; due to anemia with a hemoglobin of 7.1, elevated potassium of 5.5 and creatinine 2.7 she was asked to come to the [facility] emergency department where chest x-ray was notable for bilateral pleural effusions.  She was transfused 1 unit PRBC.  Patient underwent right heart cath today that demonstrated severely elevated filling pressures, reduced PAPi and severely reduced cardiac index.  Subsequently she was transferred to the ICU for further management.  Past Medical History:  Diagnosis Date   B12 deficiency    Cardiomyopathy ([place])    a. 09/2022 Echo: EF 35-40%; b. 09/2022 MV: apical defect w/ mild peri-infarct ischemia. EF 43%.   Chronic HFrEF (heart failure with reduced ejection fraction) ([place])    a. 01/2020 Echo: EF 30-35%; b. 07/2020 Echo: EF 50-55%; c. 06/2022 Echo: EF 50-55%; d. 09/2022 Echo: EF 35-40%, globh HK, mod reduced RV fxn, sev BAE, mod MR.   CKD (chronic kidney disease), stage IV (HCC)    COPD (chronic obstructive pulmonary disease) (HCC)    Depression    Endometriosis    Hypertension    Mixed hyperlipidemia     Moderate mitral regurgitation    Normocytic anemia    PAF (paroxysmal atrial fibrillation) (HCC)    a. CHA2DS2VASc = 4-->eliquis 2.5 bid.   Paroxymsal Atrial flutter (HCC)    Tobacco abuse     Current Facility-Administered Medications  Medication Dose Route Frequency Provider Last Rate Last Admin   0.9 %  sodium chloride infusion  250 mL Intravenous PRN End, Harrell Gave, MD       acetaminophen (TYLENOL) tablet 650 mg  650 mg Oral Q6H PRN End, Harrell Gave, MD   650 mg at 11/14/22 1705   Or   acetaminophen (TYLENOL) suppository 650 mg  650 mg Rectal Q6H PRN End, Harrell Gave, MD       albuterol (PROVENTIL) (2.5 MG/3ML) 0.083% nebulizer solution 3 mL  3 mL Nebulization Q6H PRN End, Harrell Gave, MD       amiodarone (PACERONE) tablet 200 mg  200 mg Oral Daily End, Christopher, MD   200 mg at 11/15/22 0850   atorvastatin (LIPITOR) tablet 40 mg  40 mg Oral Daily End, Christopher, MD   40 mg at 11/15/22 0848   buPROPion (WELLBUTRIN XL) 24 hr tablet 150 mg  150 mg Oral Daily End, Christopher, MD   150 mg at 11/15/22 0847   calcium carbonate (OS-CAL - dosed in mg of elemental calcium) tablet 1,250 mg  1 tablet Oral TID WC End, Harrell Gave, MD   1,250 mg at 11/15/22 [id number]   Chlorhexidine Gluconate Cloth 2 % PADS 6 each  6 each Topical Daily End, Harrell Gave, MD       [  START ON 11/16/2022] Chlorhexidine Gluconate Cloth 2 % PADS 6 each  6 each Topical Q0600 Lorella Nimrod, MD       clonazepam (KLONOPIN) disintegrating tablet 0.125 mg  0.125 mg Oral BID PRN End, Harrell Gave, MD   0.125 mg at 11/14/22 1705   fluticasone furoate-vilanterol (BREO ELLIPTA) 100-25 MCG/ACT 1 puff  1 puff Inhalation Daily End, Christopher, MD   1 puff at 11/15/22 0847   And   umeclidinium bromide (INCRUSE ELLIPTA) 62.5 MCG/ACT 1 puff  1 puff Inhalation Daily End, Christopher, MD   1 puff at 11/15/22 0847   furosemide (LASIX) 200 mg in dextrose 5 % 100 mL (2 mg/mL) infusion  6 mg/hr Intravenous Continuous Gollan, Kathlene November, MD        heparin injection 5,000 Units  5,000 Units Subcutaneous Q8H End, Harrell Gave, MD       hydrALAZINE (APRESOLINE) injection 5 mg  5 mg Intravenous Q8H PRN Cox, Amy N, DO       hydrALAZINE (APRESOLINE) injection 5 mg  5 mg Intravenous Q20 Min PRN End, Harrell Gave, MD       loratadine (CLARITIN) tablet 10 mg  10 mg Oral Daily End, Christopher, MD   10 mg at 11/15/22 0849   milrinone (PRIMACOR) 20 MG/100 ML (0.2 mg/mL) infusion  0.25 mcg/kg/min Intravenous Continuous End, Harrell Gave, MD       ondansetron (ZOFRAN) tablet 4 mg  4 mg Oral Q6H PRN End, Harrell Gave, MD       Or   ondansetron (ZOFRAN) injection 4 mg  4 mg Intravenous Q6H PRN End, Harrell Gave, MD       Derrill Memo ON 11/16/2022] patiromer (VELTASSA) packet 8.4 g  8.4 g Oral Daily Lorella Nimrod, MD       senna-docusate (Senokot-S) tablet 1 tablet  1 tablet Oral QHS PRN End, Harrell Gave, MD       sodium chloride flush (NS) 0.9 % injection 10-40 mL  10-40 mL Intracatheter Q12H End, Harrell Gave, MD       sodium chloride flush (NS) 0.9 % injection 10-40 mL  10-40 mL Intracatheter PRN End, Harrell Gave, MD       sodium chloride flush (NS) 0.9 % injection 3 mL  3 mL Intravenous Q12H End, Christopher, MD   3 mL at 11/15/22 0815   sodium chloride flush (NS) 0.9 % injection 3 mL  3 mL Intravenous Q12H End, Christopher, MD       sodium chloride flush (NS) 0.9 % injection 3 mL  3 mL Intravenous PRN End, Harrell Gave, MD       venlafaxine XR (EFFEXOR-XR) 24 hr capsule 150 mg  150 mg Oral Q breakfast End, Christopher, MD   150 mg at 11/15/22 [id number]    Allergies  Allergen Reactions   Sulfate Rash   Codeine Sulfate Nausea Only   Benicar [Olmesartan]     Talked with patient February 10, 2020, intolerance is unclear, tried several medications around that time and one of them gave her a rash but she is not clear which 1.   Amoxicillin Rash   Clindamycin/Lincomycin Rash   Entresto [Sacubitril-Valsartan] Other (See Comments)    hyperkalemia   Morphine And Related  Rash   Penicillins Rash      Social History   Socioeconomic History   Marital status: Married    Spouse name: Not on file   Number of children: Not on file   Years of education: Not on file   Highest education level: Not on file  Occupational History  Not on file  Tobacco Use   Smoking status: Former    Packs/day: 0.25    Years: 40.00    Additional pack years: 0.00    Total pack years: 10.00    Types: Cigarettes   Smokeless tobacco: Never   Tobacco comments:    Patient quit smoking recently   Vaping Use   Vaping Use: Never used  Substance and Sexual Activity   Alcohol use: Not Currently    Alcohol/week: 0.0 standard drinks of alcohol    Comment: occasional   Drug use: No   Sexual activity: Not Currently  Other Topics Concern   Not on file  Social History Narrative   Lives in [place] with her husband.  She is retired from [employer].  She does not routinely exercise.   Social Determinants of Health   Financial Resource Strain: Low Risk  (11/27/2021)   Overall Financial Resource Strain (CARDIA)    Difficulty of Paying Living Expenses: Not hard at all  Food Insecurity: No Food Insecurity (11/12/2022)   Hunger Vital Sign    Worried About Running Out of Food in the Last Year: Never true    Ran Out of Food in the Last Year: Never true  Transportation Needs: No Transportation Needs (11/12/2022)   PRAPARE - Hydrologist (Medical): No    Lack of Transportation (Non-Medical): No  Physical Activity: Sufficiently Active (11/27/2021)   Exercise Vital Sign    Days of Exercise per Week: 7 days    Minutes of Exercise per Session: 30 min  Stress: No Stress Concern Present (11/27/2021)   [place]    Feeling of Stress : Not at all  Social Connections: Unknown (11/27/2021)   Social Connection and Isolation Panel [NHANES]    Frequency of Communication with Friends and Family:  Not on file    Frequency of Social Gatherings with Friends and Family: Not on file    Attends Religious Services: Not on file    Active Member of Clubs or Organizations: Not on file    Attends Archivist Meetings: Not on file    Marital Status: Married  Intimate Partner Violence: Not At Risk (11/12/2022)   Humiliation, Afraid, Rape, and Kick questionnaire    Fear of Current or Ex-Partner: No    Emotionally Abused: No    Physically Abused: No    Sexually Abused: No      Family History  Problem Relation Age of Onset   Hypercholesterolemia Mother    Rheum arthritis Father    Rheum arthritis Daughter    Fibromyalgia Daughter    Breast cancer Neg Hx    Colon cancer Neg Hx     PHYSICAL EXAM: Vitals:   11/15/22 1348 11/15/22 1414  BP: 129/71 138/76  Pulse: 78 75  Resp: (!) 22 20  Temp:  97.9 F (36.6 C)  SpO2: 90% 93%   GENERAL: thin WF, comfortable HEENT: Negative for arcus senilis or xanthelasma. There is no scleral icterus.  The mucous membranes are pink and moist.   NECK: Supple, No masses. Normal carotid upstrokes without bruits. No masses or thyromegaly.    CHEST: There are no chest wall deformities. There is no chest wall tenderness. Respirations are unlabored.  Lungs- decreased at bases CARDIAC:  JVP: to the mandible         Normal S1, S2  Normal rate with regular rhythm. No murmurs, rubs or gallops.  Pulses are 2+ and symmetrical in upper and lower extremities. no edema.  ABDOMEN: Soft, non-tender, non-distended. There are no masses or hepatomegaly. There are normal bowel sounds.  EXTREMITIES: Warm and well perfused with no cyanosis, clubbing.  LYMPHATIC: No axillary or supraclavicular lymphadenopathy.  NEUROLOGIC: Patient is oriented x3 with no focal or lateralizing neurologic deficits.  PSYCH: Patients affect is appropriate, there is no evidence of anxiety or depression.  SKIN: Warm and dry; no lesions or wounds.   DATA REVIEW  ECG: 11/15/22: NSR  As  per my personal interpretation  ECHO: 11/14/22: LVEF 25%-30%, moderately reduced RV function; moderate to severe MR as per my personal interpretation  CATH: 11/15/22: RA (mean): 21 mmHg RV (S/EDP): 62/24 mmHg PA (S/D, mean): 63/38 (46) mmHg PCWP (mean): 34 mmHg  Ao sat: 91% PA sat: 30%  Fick CO: 3.4 L/min Fick CI: 2.0 L/min/m^2  PVR: 3.5 Wood units PAPi: 1.2    ASSESSMENT & PLAN:  Acute systolic heart failure , SCAI C Cardiogenic shock  - RHC today w/ severely elevated pre and post capillary filling pressures, reduced cardiac index and hemodynamics consistent with suboptimal RV function ([id number] & PAPi 1.2).  - Agree with milrinone 0.33mcg/kg/min; central venous catheter placed to right IJ - Repeat CO-OX ordered & pending; will need to monitor closely.  - Lactic acid ordered & pending  - Increased lasix gtt to 15mg /hr + x1 dose of metolazone 2.5mg  to augment diuresis for today.I expect volume status to improve quickly; expect rise in sCr 2/2 ATN for hypoperfusion and cardiorenal syndrome before improvement.  -Start hydralazine 10mg  TID if systolic BP remains [id number] - Can use addition of vasopressin if hypotensive or transition to dobutamine 28mcg/kg/min.  - Unfortunately due to underlying CKD and COPD she not a good candidate for advanced therapies; she does have underlying 3+ MR; can consider IABP for augmentation if she does not improve with inotropes; would consider transfer to [facility] at that point.   2. AKI on CKD - 2/2 cardiorenal syndrome and hypoperfusion  - Will continue to monitor closely.   3. Hyperkalemia - Likely to correct with diuresis.    Leeandra Ellerson Advanced Heart Failure Mechanical Circulatory Support  CRITICAL CARE Performed by: Hebert Soho   Total critical care time: 40 minutes  Critical care time was exclusive of separately billable procedures and treating other patients.  Critical care was necessary to treat or prevent imminent or  life-threatening deterioration.  Critical care was time spent personally by me on the following activities: development of treatment plan with patient and/or surrogate as well as nursing, discussions with consultants, evaluation of patient's response to treatment, examination of patient, obtaining history from patient or surrogate, ordering and performing treatments and interventions, ordering and review of laboratory studies, ordering and review of radiographic studies, pulse oximetry and re-evaluation of patient's condition.

## 2022-11-15 NOTE — Consult Note (Signed)
   [facility] CM Inpatient Consult   11/15/2022  Dawn Sherman 1955-12-07 [id number]  Orientation with Dawn Sherman, [facility] Liaison for review.   Location: [facility] Liaison spoke with Dawn Sherman remotely [facility]).   [facility]) [place] Patient: Insurance (Medicare ACO)    Primary Care Provider:  [name], MD with [place] [facility]   Patient (Dawn Sherman) screened for readmission hospitalization with noted extreme high risk score for unplanned readmission risk. [facility] liaison will assess for potential [facility]) Care Management service needs for post hospital transition for care coordination. Dawn Sherman verified support system in the home, able to obtain her medications and transport to all appointments. Dawn Sherman receptive to [facility] RN care coordination services for follow up calls related to care management services. Dawn Sherman reports she will undergo a right heart cath today. Dawn Sherman verified [facility] for RN will participate in her care upon discharge from the hospital. No additional inquires or needs presented at this time.       Plan:  HIPAA verified and [facility] RN will continue to follow ongoing disposition to assess for post hospital community care coordination/management needs.  Referral request for community care coordination: pending discharge disposition.   [name] does not replace or interfere with any arrangements made by the Inpatient Transition of Care team.   For questions contact:    Raina Mina, RN, [place] Hours M-F 8:00 am to 5 pm [phone] mobile [phone] [Office toll free line][facility] Office Hours are M-F 8:30 - 5 pm 24 hour nurse advise line [phone] Conceirge  [email or web address]

## 2022-11-15 NOTE — Progress Notes (Signed)
[facility]  ROUNDING NOTE   Subjective:   [name] was admitted to [facility] on 11/12/2022 for Hypoxia [R09.02] Symptomatic anemia [D64.9] Anemia, unspecified type [D64.9] Dyspnea, unspecified type [R06.00]  Patient was last seen by nephrology on 11/20/2021 for her chronic kidney disease by Dr. [name].   Patient seen laying in bed NPO for RHC O2 requirement increased to 4L overnight.  No lower extremity edema   Objective:  Vital signs in last 24 hours:  Temp:  [97.5 F (36.4 C)-98.5 F (36.9 C)] 98 F (36.7 C) (03/22 1134) Pulse Rate:  [69-76] 76 (03/22 1134) Resp:  [18-22] 22 (03/22 1134) BP: (135-149)/(66-86) 147/79 (03/22 1134) SpO2:  [87 %-100 %] 88 % (03/22 1209) Weight:  [60.8 kg] 60.8 kg (03/22 1134)  Weight change: -1.3 kg Filed Weights   11/14/22 0404 11/15/22 0446 11/15/22 1134  Weight: 55.5 kg 60.8 kg 60.8 kg    Intake/Output: I/O last 3 completed shifts: In: 1440 [P.O.:1440] Out: -    Intake/Output this shift:  No intake/output data recorded.  Physical Exam: General: NAD,   Head: Normocephalic, atraumatic. Moist oral mucosal membranes  Eyes: Anicteric  Lungs:  Clear to auscultation  Heart: irregular  Abdomen:  Soft, nontender,   Extremities:  no peripheral edema.  Neurologic: Nonfocal, moving all four extremities  Skin: No lesions  Access: none    Basic Metabolic Panel: Recent Labs  Lab 11/12/22 1125 11/12/22 1357 11/13/22 0428 11/14/22 0447 11/14/22 1125 11/15/22 0425 11/15/22 1231  NA 136 134* 135  --  134* 139 136  K 5.5* 5.2* 5.3*  --  5.2* 5.4* 5.1  CL 101 99 103  --  99 100  --   CO2 27 25 26   --  26 25  --   GLUCOSE 77 141* 123*  --  96 104*  --   BUN 42* 40* 43*  --  47* 54*  --   CREATININE 2.76* 2.71* 2.96*  --  2.67* 2.75*  --   CALCIUM 8.9 8.7* 8.4*  --  8.8* 9.5  --   MG  --   --   --  2.6*  --   --   --   PHOS  --   --   --  4.1  --   --   --      Liver Function Tests: Recent Labs  Lab  11/14/22 0447  AST 24  ALT 20  ALKPHOS 83  BILITOT 1.0  PROT 6.1*  ALBUMIN 3.3*    No results for input(s): "LIPASE", "AMYLASE" in the last 168 hours. No results for input(s): "AMMONIA" in the last 168 hours.  CBC: Recent Labs  Lab 11/12/22 1125 11/12/22 1357 11/13/22 0428 11/14/22 1125 11/15/22 1231  WBC 7.5 8.1 9.2 8.3  --   HGB 7.1* 7.7* 8.0* 7.9* 8.2*  HCT 23.9* 26.0* 26.5* 26.1* 24.0*  MCV 82.4 82.3 81.5 82.3  --   PLT 295 308 284 282  --      Cardiac Enzymes: No results for input(s): "CKTOTAL", "CKMB", "CKMBINDEX", "TROPONINI" in the last 168 hours.  BNP: Invalid input(s): "POCBNP"  CBG: Recent Labs  Lab 11/15/22 0841  GLUCAP 30    Microbiology: Results for orders placed or performed during the hospital encounter of 09/23/22  Resp panel by RT-PCR (RSV, Flu A&B, Covid) Anterior Nasal Swab     Status: None   Collection Time: 09/23/22  5:56 AM   Specimen: Anterior Nasal Swab  Result Value Ref  Range Status   SARS Coronavirus 2 by RT PCR NEGATIVE NEGATIVE Final    Comment: (NOTE) SARS-CoV-2 target nucleic acids are NOT DETECTED.  The SARS-CoV-2 RNA is generally detectable in upper respiratory specimens during the acute phase of infection. The lowest concentration of SARS-CoV-2 viral copies this assay can detect is 138 copies/mL. A negative result does not preclude SARS-Cov-2 infection and should not be used as the sole basis for treatment or other patient management decisions. A negative result may occur with  improper specimen collection/handling, submission of specimen other than nasopharyngeal swab, presence of viral mutation(s) within the areas targeted by this assay, and inadequate number of viral copies(<138 copies/mL). A negative result must be combined with clinical observations, patient history, and epidemiological information. The expected result is Negative.  Fact Sheet for Patients:  EntrepreneurPulse.com.au  Fact Sheet  for Healthcare Providers:  IncredibleEmployment.be  This test is no t yet approved or cleared by the [place] FDA and  has been authorized for detection and/or diagnosis of SARS-CoV-2 by FDA under an Emergency Use Authorization (EUA). This EUA will remain  in effect (meaning this test can be used) for the duration of the COVID-19 declaration under Section 564(b)(1) of the Act, 21 U.S.C.section 360bbb-3(b)(1), unless the authorization is terminated  or revoked sooner.       Influenza A by PCR NEGATIVE NEGATIVE Final   Influenza B by PCR NEGATIVE NEGATIVE Final    Comment: (NOTE) The Xpert Xpress SARS-CoV-2/FLU/RSV plus assay is intended as an aid in the diagnosis of influenza from Nasopharyngeal swab specimens and should not be used as a sole basis for treatment. Nasal washings and aspirates are unacceptable for Xpert Xpress SARS-CoV-2/FLU/RSV testing.  Fact Sheet for Patients: EntrepreneurPulse.com.au  Fact Sheet for Healthcare Providers: IncredibleEmployment.be  This test is not yet approved or cleared by the [place] FDA and has been authorized for detection and/or diagnosis of SARS-CoV-2 by FDA under an Emergency Use Authorization (EUA). This EUA will remain in effect (meaning this test can be used) for the duration of the COVID-19 declaration under Section 564(b)(1) of the Act, 21 U.S.C. section 360bbb-3(b)(1), unless the authorization is terminated or revoked.     Resp Syncytial Virus by PCR NEGATIVE NEGATIVE Final    Comment: (NOTE) Fact Sheet for Patients: EntrepreneurPulse.com.au  Fact Sheet for Healthcare Providers: IncredibleEmployment.be  This test is not yet approved or cleared by the [place] FDA and has been authorized for detection and/or diagnosis of SARS-CoV-2 by FDA under an Emergency Use Authorization (EUA). This EUA will remain in effect (meaning  this test can be used) for the duration of the COVID-19 declaration under Section 564(b)(1) of the Act, 21 U.S.C. section 360bbb-3(b)(1), unless the authorization is terminated or revoked.  Performed at [facility], [address] [place], [place] [address]   Blood culture (routine x 2)     Status: None   Collection Time: 09/23/22  7:57 AM   Specimen: BLOOD  Result Value Ref Range Status   Specimen Description BLOOD LEFT ANTECUBITAL  Final   Special Requests   Final    BOTTLES DRAWN AEROBIC AND ANAEROBIC Blood Culture adequate volume   Culture   Final    NO GROWTH 5 DAYS Performed at [facility], [address], [place], [place] [address]    Report Status 09/28/2022 FINAL  Final  Blood culture (routine x 2)     Status: None   Collection Time: 09/23/22  7:57 AM   Specimen: BLOOD  Result Value Ref Range Status   Specimen Description BLOOD BLOOD RIGHT HAND  Final   Special Requests   Final    BOTTLES DRAWN AEROBIC AND ANAEROBIC Blood Culture results may not be optimal due to an inadequate volume of blood received in culture bottles   Culture   Final    NO GROWTH 5 DAYS Performed at [facility], [address] [place],  [address]    Report Status 09/28/2022 FINAL  Final    Coagulation Studies: No results for input(s): "LABPROT", "INR" in the last 72 hours.  Urinalysis: Recent Labs    11/12/22 2349  COLORURINE STRAW*  LABSPEC 1.009  PHURINE 7.0  GLUCOSEU 150*  HGBUR NEGATIVE  BILIRUBINUR NEGATIVE  KETONESUR NEGATIVE  PROTEINUR 100*  NITRITE NEGATIVE  LEUKOCYTESUR NEGATIVE       Imaging: ECHOCARDIOGRAM LIMITED  Result Date: 11/14/2022    ECHOCARDIOGRAM LIMITED REPORT   Patient Name:   Dawn Sherman Date of Exam: 11/14/2022 Medical Rec #:  [id number]     Height:       65.0 in Accession #:    [id number]    Weight:       122.4 lb Date of Birth:  12-09-1955    BSA:          1.605 m Patient Age:    33 years      BP:            137/77 mmHg Patient Gender: F             HR:           74 bpm. Exam Location:  [facility] Procedure: 2D Echo, Cardiac Doppler and Color Doppler Indications:     Cardiomyopathy -unspecified I42.9  History:         Patient has prior history of Echocardiogram examinations, most                  recent 09/28/2022.  Sonographer:     Sherrie Sport Referring Phys:  [place] Phys: Ida Rogue MD  Sonographer Comments: Image quality was good. IMPRESSIONS  1. Left ventricular ejection fraction, by estimation, is 25 to 30%. The left ventricle has severely decreased function. The left ventricle demonstrates global hypokinesis. There is moderate left ventricular hypertrophy. Left ventricular diastolic parameters are indeterminate.  2. Right ventricular systolic function is moderately reduced. The right ventricular size is normal. There is moderately elevated pulmonary artery systolic pressure. The estimated right ventricular systolic pressure is [id number] mmHg.  3. Left atrial size was severely dilated.  4. A small pericardial effusion is present.  5. The mitral valve is normal in structure. Severe mitral valve regurgitation. No evidence of mitral stenosis.  6. Tricuspid valve regurgitation is moderate to severe.  7. The aortic valve is normal in structure. Aortic valve regurgitation is not visualized. No aortic stenosis is present.  8. The inferior vena cava is normal in size with greater than 50% respiratory variability, suggesting right atrial pressure of 3 mmHg. FINDINGS  Left Ventricle: Left ventricular ejection fraction, by estimation, is 25 to 30%. The left ventricle has severely decreased function. The left ventricle demonstrates global hypokinesis. The left ventricular internal cavity size was normal in size. There is moderate left ventricular hypertrophy. Left ventricular diastolic parameters are indeterminate. Right Ventricle: The right ventricular size is normal. No increase in right ventricular wall  thickness. Right ventricular systolic function is moderately reduced. There is moderately elevated pulmonary artery systolic pressure. The  tricuspid regurgitant velocity is 3.19 m/s, and with an assumed right atrial pressure of 5 mmHg, the estimated right ventricular systolic pressure is [id number] mmHg. Left Atrium: Left atrial size was severely dilated. Right Atrium: Right atrial size was normal in size. Pericardium: A small pericardial effusion is present. Mitral Valve: The mitral valve is normal in structure. There is mild calcification of the mitral valve leaflet(s). Mild mitral annular calcification. Severe mitral valve regurgitation. No evidence of mitral valve stenosis. Tricuspid Valve: The tricuspid valve is normal in structure. Tricuspid valve regurgitation is moderate to severe. No evidence of tricuspid stenosis. Aortic Valve: The aortic valve is normal in structure. Aortic valve regurgitation is not visualized. No aortic stenosis is present. Pulmonic Valve: The pulmonic valve was normal in structure. Pulmonic valve regurgitation is mild. No evidence of pulmonic stenosis. Aorta: The aortic root is normal in size and structure. Venous: The inferior vena cava is normal in size with greater than 50% respiratory variability, suggesting right atrial pressure of 3 mmHg. IAS/Shunts: No atrial level shunt detected by color flow Doppler. Additional Comments: Spectral Doppler performed. Color Doppler performed.  LEFT VENTRICLE PLAX 2D LVIDd:         5.80 cm LVIDs:         4.70 cm LV PW:         1.20 cm LV IVS:        1.20 cm  LV Volumes (MOD) LV vol d, MOD A2C: 159.0 ml LV vol d, MOD A4C: 127.0 ml LV vol s, MOD A2C: 119.0 ml LV vol s, MOD A4C: 107.0 ml LV SV MOD A2C:     40.0 ml LV SV MOD A4C:     127.0 ml LV SV MOD BP:      35.0 ml RIGHT VENTRICLE RV Basal diam:  3.00 cm RV Mid diam:    2.30 cm LEFT ATRIUM            Index LA Vol (A2C): 130.0 ml 80.98 ml/m LA Vol (A4C): 57.0 ml  35.51 ml/m  TRICUSPID VALVE TR Peak  grad:   40.7 mmHg TR Vmax:        319.00 cm/s Ida Rogue MD Electronically signed by Ida Rogue MD Signature Date/Time: 11/14/2022/5:02:08 PM    Final (Updated)    US RENAL  Result Date: 11/13/2022 CLINICAL DATA:  Renal dysfunction 08/30/2022 EXAM: RENAL / URINARY TRACT ULTRASOUND COMPLETE COMPARISON:  08/30/2022 FINDINGS: Right Kidney: Renal measurements: 6 x 2.4 x 2.6 cm = volume: 18.67 mL. There is no hydronephrosis. Right kidney is small in size. There is marked increase in cortical echogenicity. Left Kidney: Renal measurements: 8 x 9 x 4.7 x 3.6 cm = volume: 78.44 mL. There is increased cortical echogenicity. There is no hydronephrosis. There is a 1 cm cyst in the upper pole. Bladder: Appears normal for degree of bladder distention. Other: Incidental note is made of right pleural effusion. IMPRESSION: There is no hydronephrosis. There is marked atrophy right kidney. There is increased cortical echogenicity in both kidneys suggesting medical renal disease. There is 1 cm cyst in the left kidney.  Right pleural effusion. Electronically Signed   By: Elmer Picker M.D.   On: 11/13/2022 17:52     Medications:    sodium chloride     sodium chloride 10 mL/hr at 11/15/22 1132    [MAR Hold] amiodarone  200 mg Oral Daily   [MAR Hold] atorvastatin  40 mg Oral Daily   [MAR Hold] buPROPion  150 mg Oral Daily   [  MAR Hold] calcium carbonate  1 tablet Oral TID WC   [MAR Hold] fluticasone furoate-vilanterol  1 puff Inhalation Daily   And   [MAR Hold] umeclidinium bromide  1 puff Inhalation Daily   [MAR Hold] loratadine  10 mg Oral Daily   [MAR Hold] metoprolol succinate  25 mg Oral Daily   [MAR Hold] patiromer  16.8 g Oral Daily   [MAR Hold] sodium chloride flush  3 mL Intravenous Q12H   [MAR Hold] venlafaxine XR  150 mg Oral Q breakfast   sodium chloride, [MAR Hold] acetaminophen **OR** [MAR Hold] acetaminophen, [MAR Hold] albuterol, [MAR Hold] clonazepam, [MAR Hold] hydrALAZINE, [MAR Hold]  ondansetron **OR** [MAR Hold] ondansetron (ZOFRAN) IV, [MAR Hold] senna-docusate, sodium chloride flush  Assessment/ Plan:  Ms. TONNA FRONTZ is a 67 y.o.  female with hypertension, atrial fibrillation, chronic congestive heart failure, atrophic right kidney, endometriosis, hyperlipidemia, tobacco abuse, GERD, and depression who is admitted to [facility] on 11/12/2022 for Hypoxia [R09.02] Symptomatic anemia [D64.9] Anemia, unspecified type [D64.9] Dyspnea, unspecified type [R06.00]  Chronic kidney disease stage IV: with hyperkalemia and proteinuria. Renal function has progressively decreased in the last few months.  - continue patiromer - hold home dapagliflozin -Renal function continues to slowly rise -No urine output recorded.  - Will continue to monitor for now, avoid nephrotoxic agents and therapies.   Hypertension and acute exacerbation of systolic congestive heart failure: on torsemide as outpatient.  - status post IV furosemide - continue metoprolol - Appreciate cardiology input. Continue to hold diuresis and RHC today  Anemia with chronic kidney disease and iron deficiency: negative for GI bleed.  -IV iron given on 11/13/22 -Hgb remains 8.2  Secondary Hyperparathyroidism: with hypocalcemia - PTH 92 -Phosphorus 4.1   LOS: 3 Masaichi Kracht 3/22/202412:36 PM

## 2022-11-16 DIAGNOSIS — D649 Anemia, unspecified: Secondary | ICD-10-CM | POA: Diagnosis not present

## 2022-11-16 DIAGNOSIS — I4819 Other persistent atrial fibrillation: Secondary | ICD-10-CM | POA: Diagnosis not present

## 2022-11-16 DIAGNOSIS — I340 Nonrheumatic mitral (valve) insufficiency: Secondary | ICD-10-CM | POA: Diagnosis not present

## 2022-11-16 DIAGNOSIS — R0600 Dyspnea, unspecified: Secondary | ICD-10-CM | POA: Diagnosis not present

## 2022-11-16 DIAGNOSIS — R0902 Hypoxemia: Secondary | ICD-10-CM | POA: Diagnosis not present

## 2022-11-16 DIAGNOSIS — I480 Paroxysmal atrial fibrillation: Secondary | ICD-10-CM | POA: Diagnosis not present

## 2022-11-16 DIAGNOSIS — I420 Dilated cardiomyopathy: Secondary | ICD-10-CM | POA: Diagnosis not present

## 2022-11-16 LAB — COOXEMETRY PANEL
Carboxyhemoglobin: 1 % (ref 0.5–1.5)
Methemoglobin: <0.7 % (ref 0.0–1.5)
O2 Saturation: 51 %
Total hemoglobin: 10 g/dL — ABNORMAL LOW (ref 12.0–16.0)
Total oxygen content: 50.4 %

## 2022-11-16 LAB — BASIC METABOLIC PANEL
Anion gap: 7 (ref 5–15)
BUN: 64 mg/dL — ABNORMAL HIGH (ref 8–23)
CO2: 26 mmol/L (ref 22–32)
Calcium: 9.1 mg/dL (ref 8.9–10.3)
Chloride: 103 mmol/L (ref 98–111)
Creatinine, Ser: 2.91 mg/dL — ABNORMAL HIGH (ref 0.44–1.00)
GFR, Estimated: 17 mL/min — ABNORMAL LOW (ref >60)
Glucose, Bld: 92 mg/dL (ref 70–99)
Potassium: 4.7 mmol/L (ref 3.5–5.1)
Sodium: 136 mmol/L (ref 135–145)

## 2022-11-16 LAB — MAGNESIUM: Magnesium: 2.5 mg/dL — ABNORMAL HIGH (ref 1.7–2.4)

## 2022-11-16 MED ORDER — METOLAZONE 2.5 MG PO TABS
2.5000 mg | ORAL_TABLET | Freq: Once | ORAL | Status: AC
  Administered 2022-11-16: 2.5 mg via ORAL
  Filled 2022-11-16: qty 1

## 2022-11-16 MED ORDER — AMIODARONE HCL 200 MG PO TABS
200.0000 mg | ORAL_TABLET | Freq: Once | ORAL | Status: AC
  Administered 2022-11-16: 200 mg via ORAL
  Filled 2022-11-16: qty 1

## 2022-11-16 MED ORDER — APIXABAN 2.5 MG PO TABS
2.5000 mg | ORAL_TABLET | Freq: Two times a day (BID) | ORAL | Status: DC
  Administered 2022-11-16 – 2022-11-21 (×11): 2.5 mg via ORAL
  Filled 2022-11-16 (×11): qty 1

## 2022-11-16 MED ORDER — AMIODARONE HCL 200 MG PO TABS
400.0000 mg | ORAL_TABLET | Freq: Two times a day (BID) | ORAL | Status: DC
  Administered 2022-11-16 – 2022-11-17 (×2): 400 mg via ORAL
  Filled 2022-11-16 (×2): qty 2

## 2022-11-16 MED ORDER — HYDRALAZINE HCL 10 MG PO TABS
10.0000 mg | ORAL_TABLET | Freq: Four times a day (QID) | ORAL | Status: DC
  Administered 2022-11-16 (×2): 10 mg via ORAL
  Filled 2022-11-16 (×5): qty 1

## 2022-11-16 NOTE — Progress Notes (Addendum)
Rounding Note    Patient Name: Dawn Sherman Date of Encounter: 11/16/2022  [place] Cardiologist: Ida Rogue, MD   Subjective   Remains on milrinone and Lasix infusion, received dose of metolazone yesterday -2 L Reports that she feels well, no complaints Seen by CHF team yesterday Overnight converting from normal sinus rhythm to atrial fibrillation, asymptomatic  Inpatient Medications    Scheduled Meds:  amiodarone  400 mg Oral BID   apixaban  2.5 mg Oral BID   atorvastatin  40 mg Oral Daily   buPROPion  150 mg Oral Daily   calcium carbonate  1 tablet Oral TID WC   Chlorhexidine Gluconate Cloth  6 each Topical Daily   Chlorhexidine Gluconate Cloth  6 each Topical Q0600   fluticasone furoate-vilanterol  1 puff Inhalation Daily   And   umeclidinium bromide  1 puff Inhalation Daily   hydrALAZINE  10 mg Oral Q6H   loratadine  10 mg Oral Daily   sodium chloride flush  10-40 mL Intracatheter Q12H   sodium chloride flush  3 mL Intravenous Q12H   sodium chloride flush  3 mL Intravenous Q12H   venlafaxine XR  150 mg Oral Q breakfast   Continuous Infusions:  sodium chloride     furosemide (LASIX) 200 mg in dextrose 5 % 100 mL (2 mg/mL) infusion 5 mg/hr (11/16/22 1214)   milrinone 0.25 mcg/kg/min (11/16/22 1214)   PRN Meds: sodium chloride, acetaminophen **OR** acetaminophen, albuterol, clonazepam, ondansetron **OR** ondansetron (ZOFRAN) IV, senna-docusate, sodium chloride flush, sodium chloride flush   Vital Signs    Vitals:   11/16/22 1100 11/16/22 1200 11/16/22 1300 11/16/22 1400  BP: 133/73 125/68 115/85 110/72  Pulse: 81 84 97 92  Resp: 15 14 20 15   Temp:  98.2 F (36.8 C)    TempSrc:  Oral    SpO2: 97% 94% (!) 73% 93%  Weight:      Height:        Intake/Output Summary (Last 24 hours) at 11/16/2022 1421 Last data filed at 11/16/2022 1214 Gross per 24 hour  Intake 1193.17 ml  Output 4250 ml  Net -3056.83 ml      11/16/2022    8:30 AM  11/15/2022    2:14 PM 11/15/2022   11:34 AM  Last 3 Weights  Weight (lbs) 127 lb 3.3 oz 166 lb 14.2 oz 134 lb 0.6 oz  Weight (kg) 57.7 kg 75.7 kg 60.8 kg      Telemetry    Converting to atrial fibrillation- Personally Reviewed  ECG     - Personally Reviewed  Physical Exam   Constitutional:  oriented to person, place, and time. No distress.  HENT:  Head: Grossly normal Eyes:  no discharge. No scleral icterus.  Neck: JVD 10+, no carotid bruits  Cardiovascular: Irregularly irregular, no murmurs appreciated Pulmonary/Chest: Moderately decreased breath sounds, scattered Rales, dullness at the bases Abdominal: Soft.  no distension.  no tenderness.  Musculoskeletal: Normal range of motion Neurological:  normal muscle tone. Coordination normal. No atrophy Skin: Skin warm and dry Psychiatric: normal affect, pleasant  Labs    High Sensitivity Troponin:   Recent Labs  Lab 11/12/22 1357 11/12/22 1858  TROPONINIHS 10 9     Chemistry Recent Labs  Lab 11/14/22 0447 11/14/22 1125 11/15/22 0425 11/15/22 1231 11/15/22 1235 11/16/22 0326  NA  --  134* 139 136 137 136  K  --  5.2* 5.4* 5.1 5.1 4.7  CL  --  99 100  --   --  103  CO2  --  26 25  --   --  26  GLUCOSE  --  96 104*  --   --  92  BUN  --  47* 54*  --   --  64*  CREATININE  --  2.67* 2.75*  --   --  2.91*  CALCIUM  --  8.8* 9.5  --   --  9.1  MG 2.6*  --   --   --   --  2.5*  PROT 6.1*  --   --   --   --   --   ALBUMIN 3.3*  --   --   --   --   --   AST 24  --   --   --   --   --   ALT 20  --   --   --   --   --   ALKPHOS 83  --   --   --   --   --   BILITOT 1.0  --   --   --   --   --   GFRNONAA  --  19* 18*  --   --  17*  ANIONGAP  --  9 14  --   --  7    Lipids No results for input(s): "CHOL", "TRIG", "HDL", "LABVLDL", "LDLCALC", "CHOLHDL" in the last 168 hours.  Hematology Recent Labs  Lab 11/13/22 0428 11/14/22 1125 11/15/22 0425 11/15/22 1231 11/15/22 1235  WBC 9.2 8.3 9.0  --   --   RBC 3.25*  3.17* 3.31*  --   --   HGB 8.0* 7.9* 8.1* 8.2* 8.2*  HCT 26.5* 26.1* 28.3* 24.0* 24.0*  MCV 81.5 82.3 85.5  --   --   MCH 24.6* 24.9* 24.5*  --   --   MCHC 30.2 30.3 28.6*  --   --   RDW 17.0* 17.1* 17.5*  --   --   PLT 284 282 270  --   --    Thyroid No results for input(s): "TSH", "FREET4" in the last 168 hours.  BNP Recent Labs  Lab 11/12/22 1125 11/12/22 1357  BNP 3,619.9* 3,247.0*    DDimer No results for input(s): "DDIMER" in the last 168 hours.   Radiology    DG Chest Port 1 View  Result Date: 11/15/2022 CLINICAL DATA:  Central venous catheter in place. EXAM: PORTABLE CHEST 1 VIEW COMPARISON:  Chest radiograph 11/12/2022 and earlier FINDINGS: Of note, the patient is mildly rotated on this portable examination. Interval placement of right IJ central venous catheter with the tip projecting at the level of the superior cavoatrial junction. Stable enlarged cardiac silhouette. New moderate layering right pleural effusion. No left pleural effusion. Mild diffuse hazy interstitial thickening. No pneumothorax. Old bilateral rib fractures. No acute osseous abnormality. Rightward curvature of the distal thoracic spine. IMPRESSION: 1. Interval placement of right IJ central venous catheter with the tip projecting at the level of the superior cavoatrial junction. 2. Moderate layering right pleural effusion, increased compared to most recent exam. No left pleural effusion. 3. Mild pulmonary edema. Electronically Signed   By: Ileana Roup M.D.   On: 11/15/2022 14:14   CARDIAC CATHETERIZATION  Result Date: 11/15/2022 Conclusions: Severely elevated left heart, right heart, and pulmonary artery pressure. Mildly reduced Fick cardiac output/index.  Cardiac output may be overestimated in the setting of significant anemia. Successful placement of a 56F triple-lumen central venous catheter via the right internal  jugular vein. Recommendations: Transfer to stepdown. Initiate milrinone 0.25 mcg/kg/min, with  further titration based on blood pressure, urine output, and cooximetry. Hold metoprolol in the setting of low cardiac output requiring inotropic therapy. Resume diuresis. Obtain portable chest radiograph when patient reaches ICU to confirm central line placement. Nelva Bush, MD [facility]   Cardiac Studies   Echo . Left ventricular ejection fraction, by estimation, is 25 to 30%. The  left ventricle has severely decreased function. The left ventricle  demonstrates global hypokinesis. There is moderate left ventricular  hypertrophy. Left ventricular diastolic  parameters are indeterminate.   2. Right ventricular systolic function is moderately reduced. The right  ventricular size is normal. There is moderately elevated pulmonary artery  systolic pressure. The estimated right ventricular systolic pressure is  [id number] mmHg.   3. Left atrial size was severely dilated.   4. A small pericardial effusion is present.   5. The mitral valve is normal in structure. Severe mitral valve  regurgitation. No evidence of mitral stenosis.   6. Tricuspid valve regurgitation is moderate to severe.   7. The aortic valve is normal in structure. Aortic valve regurgitation is  not visualized. No aortic stenosis is present.   8. The inferior vena cava is normal in size with greater than 50%  respiratory variability, suggesting right atrial pressure of 3 mmHg.   Patient Profile     Dawn Sherman is a 67 year old woman with history of smoking, COPD, paroxysmal atrial fibrillation/flutter, moderate MR, chronic kidney disease stage IV, cardiomyopathy ejection fraction 30 to 35% in the setting of atrial fibrillation/flutter on Eliquis who presents to the hospital via cardiology clinic for worsening shortness of breath, anemia   Assessment & Plan    A/P: Acute on chronic diastolic and systolic CHF Severely reduced ejection fraction on echo this admission, continues to drop over the past 6 months Recent  stress test with fixed defect, no significant ischemia cardiac catheterization delayed in the setting of stage IV chronic kidney disease -Denies anginal symptoms Right heart catheterization with pressures as below ..RA 21, PA 63/38, PCWP 34. CO 3.4, CI 2.0, SpO2 30%.  Yesterday evening started on milrinone, Lasix infusion, -2 L Plan to continue above Started on hydralazine 10 mg 3 times daily Metolazone 2.5 daily   Acute on chronic anemia In the setting of renal failure Received 1 unit, hemoglobin 8.1   Paroxysmal atrial fibrillation/flutter Converting to atrial fibrillation last night on low-dose amiodarone,  Eliquis renal dose given weight and renal function Loading of amiodarone to restore normal sinus rhythm Beta-blocker held for now in the setting of CHF on milrinone   Chronic kidney disease stage IV Hyperkalemia, anemia Nephrology following   Long smoking history, COPD Cessation recommended, likely underlying COPD   Total encounter time more than 50 minutes  Greater than 50% was spent in counseling and coordination of care with the patient  For questions or updates, please contact [place] Please consult [email or web address] for contact info under        Signed, Ida Rogue, MD  11/16/2022, 2:21 PM

## 2022-11-16 NOTE — Progress Notes (Signed)
Progress Note   Patient: Dawn Sherman [id number] DOB: 1956-04-06 DOA: 11/12/2022     4 DOS: the patient was seen and examined on 11/16/2022   Brief hospital course: [name] is a 67 year old female with history of heart failure preserved ejection fraction, paroxysmal atrial fibrillation on low-dose Eliquis, history of subarachnoid hemorrhage secondary to mechanical fall, CKD stage IV, COPD, hypertension, who presents to emergency department from cardiology outpatient clinic for chief concerns of symptomatic anemia.  Vitals in the ED showed temperature of 97.8, respiration rate 18, heart rate 66, blood pressure 116/66, SpO2 of 97% on room air.  Serum sodium is 134, potassium 5.2, chloride 99, bicarb 25, BUN 40, serum creatinine of 2.71, nonfasting blood glucose 131, WBC 8.1, hemoglobin 7.7, platelets of 308.  eGFR 19.  BNP elevated at 3247.  High sensitive troponin has been ordered and is in process.  ED treatment: None  3/20: Vital stable on 4 L of oxygen this morning.  Apparently patient developed shortness of breath with flushing and muscle aches while getting blood transfusion which was discontinued at that time UA and transfusion reaction labs were negative.  Chest x-ray with pulmonary vascular congestion s/p IV Lasix and Benadryl.  CBC this morning with hemoglobin of 8.  Anemia panel with iron deficiency.  IV iron supplement ordered. BMP with slight worsening of creatinine to 2.96.  And potassium of 5.3.  Nephrology was also consulted.  3/21: Vitals and labs seems stable.  Remained on 3 L of oxygen.  Cardiology was also consulted and patient will be going for right heart catheterization tomorrow.  3/22: Vital stable, renal function continued to get worse slowly, persistent hyperkalemia with potassium at 5.4 despite getting Veltassa - dose was increased today. Echocardiogram with worsening of EF and right heart catheterization today with significantly elevated pressures.  Patient  was started on milrinone and Lasix infusion by cardiology.  3/23: Vital stable, remained on 3 L of oxygen.  Hemoglobin improved to 10, slight worsening of renal function with creatinine at 2.91, UOP recorded 2350, patient remains on milrinone and Lasix infusion.  Assessment and Plan: * Acute on chronic combined systolic and diastolic CHF (congestive heart failure) (HCC) Repeat echo done with worsening of EF, right heart cath  with significantly elevated pressures. Patient is being transferred to ICU to start milrinone and Lasix infusion. -Continue with strict intake and output -Daily BMP and weight -Appreciate advanced heart failure team as they are managing her heart failure now  Acute hypoxemic respiratory failure ([facility]) Likely multifactorial in setting of symptomatic acute on chronic anemia and small bilateral pleural effusion Procalcitonin negative.  Patient uses 2-3 L of oxygen at night at home. -Continue supplemental oxygen-wean as tolerated  Symptomatic anemia Hemoglobin at 10 this morning Patient's baseline hemoglobin over the last 2 months has been 9.5-10.6 S/p partial 1 unit of PRBC as she developed worsening shortness of breath while getting transfusion which was discontinued, patient also received IV Lasix overnight.  Anemia panel with some iron deficiency and also consistent with anemia of chronic disease. Denies any obvious bleeding, patient was on Eliquis at home which is being held. Also received IV iron supplement -Check FOBT-negative -Monitor hemoglobin  Persistent atrial fibrillation (HCC) Amiodarone 200 mg daily resumed Eliquis 2.5 mg p.o. twice daily not resumed on admission due to acute on chronic anemia, symptomatic anemia We will resume home Eliquis from tomorrow as there is no obvious bleeding. Check FOBT  Hyperkalemia Resolved -Discontinue Veltassa -Patient is also been started  on Lasix infusion -Monitors potassium  CKD (chronic kidney disease), stage IV  ([place]) Patient with slight worsening of renal function, creatinine at 2.91. UA with protein urea and BMP with mild hyperkalemia. -Nephrology on board. -Monitor renal function-patient is being started on Lasix infusion -Avoid nephrotoxins  HLD (hyperlipidemia) Atorvastatin 40 mg daily resumed  Tobacco use Patient reports that she has quit, her last cigarette was 1.5 months ago  Essential hypertension Metoprolol succinate 25 mg daily resumed Hydralazine 5 mg IV every 8 hours as needed for SBP greater than 175, 4 days ordered  Dyslipidemia Atorvastatin 40 mg daily resumed  Depression, major, single episode, mild (HCC) Bupropion 150 mg and venlafaxine 150 mg daily resumed  GERD without esophagitis PPI resumed   Subjective: Patient was seen and examined today.  No new complaints.  Saying that I am hanging in here  Physical Exam: Vitals:   11/16/22 0900 11/16/22 1000 11/16/22 1100 11/16/22 1200  BP: (!) 124/107 (!) 136/99 133/73 125/68  Pulse:  78 81 84  Resp: (!) 21 17 15 14   Temp:    98.2 F (36.8 C)  TempSrc:    Oral  SpO2: 100% 99% 97% 94%  Weight:      Height:       General.  Well-developed lady, in no acute distress. Pulmonary.  Lungs clear bilaterally, normal respiratory effort. CV.  Regular rate and rhythm, no JVD, rub or murmur. Abdomen.  Soft, nontender, nondistended, BS positive. CNS.  Alert and oriented .  No focal neurologic deficit. Extremities.  No edema, no cyanosis, pulses intact and symmetrical. Psychiatry.  Judgment and insight appears normal.    Data Reviewed: Prior data reviewed  Family Communication:   Disposition: Status is: Inpatient Remains inpatient appropriate because: Severity of illness  Planned Discharge Destination: Home  Time spent: 50 minutes  This record has been created using Systems analyst. Errors have been sought and corrected,but may not always be located. Such creation errors do not reflect on the  standard of care.   Author: Lorella Nimrod, MD 11/16/2022 12:42 PM  For on call review [email or web address].

## 2022-11-16 NOTE — TOC Initial Note (Signed)
Transition of Care [facility]) - Initial/Assessment Note    Patient Details  Name: Dawn Sherman MRN: [id number] Date of Birth: Dec 06, 1955  Transition of Care [facility]) CM/SW Contact:    Tiburcio Bash, LCSW Phone Number: 11/16/2022, 11:26 AM  Clinical Narrative:                  CSW met with patient at bedside, confirmed active with [facility] RN agreeable to received [facility] services at time of dc. Patient reports she is currently on night 3L O2 at home via [employer]. Patient reports she has 3 tanks at home and a concentrator. She has a walker at home and does not feel she needs a 3in1. PCP [name], no further needs identified by patient at this time.    Expected Discharge Plan: [place] Barriers to Discharge: Continued Medical Work up   Patient Goals and CMS Choice Patient states their goals for this hospitalization and ongoing recovery are:: to go home CMS Medicare.gov Compare Post Acute Care list provided to:: Patient Choice offered to / list presented to : Patient      Expected Discharge Plan and Services       Living arrangements for the past 2 months: Single Family Home                                      Prior Living Arrangements/Services Living arrangements for the past 2 months: Single Family Home Lives with:: Self                   Activities of Daily Living Home Assistive Devices/Equipment: Oxygen ADL Screening (condition at time of admission) Patient's cognitive ability adequate to safely complete daily activities?: Yes Is the patient deaf or have difficulty hearing?: No Does the patient have difficulty seeing, even when wearing glasses/contacts?: No Does the patient have difficulty concentrating, remembering, or making decisions?: No Patient able to express need for assistance with ADLs?: No Does the patient have difficulty dressing or bathing?: No Independently performs ADLs?: Yes (appropriate for developmental age) Does the  patient have difficulty walking or climbing stairs?: No Weakness of Legs: Both Weakness of Arms/Hands: Both  Permission Sought/Granted                  Emotional Assessment              Admission diagnosis:  Hypoxia [R09.02] Symptomatic anemia [D64.9] Anemia, unspecified type [D64.9] Dyspnea, unspecified type [R06.00] Patient Active Problem List   Diagnosis Date Noted   Hypoxia 11/15/2022   Dilated cardiomyopathy ([place]) 11/15/2022   Mitral valve insufficiency 11/15/2022   Hyperkalemia 11/13/2022   Symptomatic anemia 11/12/2022   Pneumonia due to infectious organism 09/30/2022   Pleural effusion due to CHF (congestive heart failure) ([place]) 09/30/2022   Acute on chronic diastolic CHF (congestive heart failure) ([place]) 09/23/2022   Chest pain 09/23/2022   Acute on chronic respiratory failure with hypoxia ([place]) 09/23/2022   (HFpEF) heart failure with preserved ejection fraction ([place]) 09/07/2022   Positive D dimer 09/07/2022   Depression with anxiety 09/07/2022   Hypothyroidism 09/07/2022   Epigastric pain 08/14/2022   Dyspnea 07/16/2022   Fatigue 07/16/2022   Acute on chronic congestive heart failure ([place]) 07/04/2022   Acute hypoxemic respiratory failure ([place]) 06/30/2022   Tachycardia 06/30/2022   Anemia 05/06/2022   Chronic heart failure with preserved ejection fraction ([place]) 03/26/2022  Myocardial injury 03/26/2022   CKD (chronic kidney disease), stage IV ([place]) 03/26/2022   Iron deficiency anemia 03/26/2022   Generalized weakness 12/17/2021   Essential hypertension 12/17/2021   Dyslipidemia 12/17/2021   Depression, major, single episode, complete remission ([place]) 12/17/2021   GERD without esophagitis 12/17/2021   Chronic diastolic CHF (congestive heart failure) ([place]) 12/17/2021   AKI (acute kidney injury) ([place]) 12/16/2021   Second degree AV block    History of subarachnoid hemorrhage 12/10/2021   Hypokalemia 12/10/2021   Hyponatremia 12/10/2021   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   Persistent atrial fibrillation ([place]) 12/10/2021   Hypotension 12/10/2021   Swelling of lower extremity 10/28/2021   Prediabetes 08/19/2021   Chronic obstructive pulmonary disease ([place]) 11/22/2020   Paroxysmal atrial fibrillation (HCC) 08/12/2020   Dehydration    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HCC)    Acute on chronic combined systolic and diastolic CHF (congestive heart failure) ([place]) 02/10/2020   Leukocytosis 02/10/2020   HLD (hyperlipidemia) 02/10/2020   Elevated troponin 02/10/2020   GERD (gastroesophageal reflux disease) 02/10/2020   Acute respiratory failure with hypoxia ([place]) 02/10/2020   Abnormal mammogram 07/13/2019   History of alcohol abuse 07/31/2018   B12 deficiency 07/31/2018   Acute renal failure superimposed on stage 3b chronic kidney disease ([place]) 11/03/2016   Health care maintenance 12/02/2014   Tobacco use 12/02/2014   Hypercholesterolemia 11/29/2014   Encounter for screening colonoscopy 12/16/2013   Breast mass 12/15/2012   Hypertension 07/05/2012   Depression, major, single episode, mild ([place]) 07/05/2012   PCP:  [name], MD Pharmacy:   [facility] [place], [place] - [address] [place] [address] Phone: [phone] Fax: [phone]     Social Determinants of Health (SDOH) Social History: SDOH Screenings   Food Insecurity: No Food Insecurity (11/12/2022)  Housing: Low Risk  (11/12/2022)  Transportation Needs: No Transportation Needs (11/12/2022)  Utilities: Not At Risk (11/12/2022)  Depression (PHQ2-9): Low Risk  (10/09/2022)  Financial Resource Strain: Low Risk  (11/27/2021)  Physical Activity: Sufficiently Active (11/27/2021)  Social Connections: Unknown (11/27/2021)  Stress: No Stress Concern Present (11/27/2021)  Tobacco Use: Medium Risk (11/15/2022)   SDOH Interventions:     Readmission Risk Interventions    07/02/2022   12:26 PM 12/11/2021    3:17 PM  Readmission Risk  Prevention Plan  Transportation Screening Complete Complete  PCP or Specialist Appt within 3-5 Days  Complete  HRI or [place]  Complete  Social Work Consult for [place] Planning/Counseling  Complete  Palliative Care Screening  Not Applicable  Medication Review Press photographer) Complete Complete  PCP or Specialist appointment within 3-5 days of discharge Complete   SW Recovery Care/Counseling Consult Complete   [place] Not Applicable

## 2022-11-16 NOTE — Progress Notes (Signed)
[facility]  ROUNDING NOTE   Subjective:   [name] was admitted to [facility] on 11/12/2022 for Hypoxia [R09.02] Symptomatic anemia [D64.9] Anemia, unspecified type [D64.9] Dyspnea, unspecified type [R06.00]  Patient was last seen by nephrology on 11/20/2021 for her chronic kidney disease by Dr. [name].  Patient is doing fair.  she is on Lasix drip.  Urine output appears to have improved. Creatinine slightly higher at 2.91 Sitting up on the edge of bed Still requiring 3 L oxygen nasal cannula which is close to her baseline.   Objective:  Vital signs in last 24 hours:  Temp:  [97.6 F (36.4 C)-98.2 F (36.8 C)] 98 F (36.7 C) (03/23 0830) Pulse Rate:  [45-125] 81 (03/23 1100) Resp:  [14-24] 15 (03/23 1100) BP: (105-144)/(66-117) 133/73 (03/23 1100) SpO2:  [86 %-100 %] 97 % (03/23 1100) Weight:  [57.7 kg-75.7 kg] 57.7 kg (03/23 0830)  Weight change: 0 kg Filed Weights   11/15/22 1134 11/15/22 1414 11/16/22 0830  Weight: 60.8 kg 75.7 kg 57.7 kg    Intake/Output: I/O last 3 completed shifts: In: 69 [P.O.:600; I.V.:160] Out: 2350 [Urine:2350]   Intake/Output this shift:  Total I/O In: 692.9 [P.O.:650; I.V.:42.9] Out: 900 [Urine:900]  Physical Exam: General: NAD,   Head: Normocephalic, atraumatic. Moist oral mucosal membranes  Eyes: Anicteric  Lungs:  Clear to auscultation  Heart: irregular  Abdomen:  Soft, nontender,   Extremities:  no peripheral edema.  Neurologic: Nonfocal, moving all four extremities  Skin: No lesions  Access: none    Basic Metabolic Panel: Recent Labs  Lab 11/12/22 1357 11/13/22 0428 11/14/22 0447 11/14/22 1125 11/15/22 0425 11/15/22 1231 11/15/22 1235 11/16/22 0326  NA 134* 135  --  134* 139 136 137 136  K 5.2* 5.3*  --  5.2* 5.4* 5.1 5.1 4.7  CL 99 103  --  99 100  --   --  103  CO2 25 26  --  26 25  --   --  26  GLUCOSE 141* 123*  --  96 104*  --   --  92  BUN 40* 43*  --  47* 54*  --   --  64*  CREATININE  2.71* 2.96*  --  2.67* 2.75*  --   --  2.91*  CALCIUM 8.7* 8.4*  --  8.8* 9.5  --   --  9.1  MG  --   --  2.6*  --   --   --   --  2.5*  PHOS  --   --  4.1  --   --   --   --   --      Liver Function Tests: Recent Labs  Lab 11/14/22 0447  AST 24  ALT 20  ALKPHOS 83  BILITOT 1.0  PROT 6.1*  ALBUMIN 3.3*    No results for input(s): "LIPASE", "AMYLASE" in the last 168 hours. No results for input(s): "AMMONIA" in the last 168 hours.  CBC: Recent Labs  Lab 11/12/22 1125 11/12/22 1357 11/13/22 0428 11/14/22 1125 11/15/22 0425 11/15/22 1231 11/15/22 1235  WBC 7.5 8.1 9.2 8.3 9.0  --   --   HGB 7.1* 7.7* 8.0* 7.9* 8.1* 8.2* 8.2*  HCT 23.9* 26.0* 26.5* 26.1* 28.3* 24.0* 24.0*  MCV 82.4 82.3 81.5 82.3 85.5  --   --   PLT 295 308 284 282 270  --   --      Cardiac Enzymes: No results for input(s): "CKTOTAL", "CKMB", "  CKMBINDEX", "TROPONINI" in the last 168 hours.  BNP: Invalid input(s): "POCBNP"  CBG: Recent Labs  Lab 11/15/22 0841 11/15/22 1417  GLUCAP 87 103     Microbiology: Results for orders placed or performed during the hospital encounter of 11/12/22  MRSA Next Gen by PCR, Nasal     Status: None   Collection Time: 11/15/22  2:17 PM   Specimen: Nasal Mucosa; Nasal Swab  Result Value Ref Range Status   MRSA by PCR Next Gen NOT DETECTED NOT DETECTED Final    Comment: (NOTE) The GeneXpert MRSA Assay (FDA approved for NASAL specimens only), is one component of a comprehensive MRSA colonization surveillance program. It is not intended to diagnose MRSA infection nor to guide or monitor treatment for MRSA infections. Test performance is not FDA approved in patients less than 17 years old. Performed at [facility], [address] [place], [place] [address]     Coagulation Studies: No results for input(s): "LABPROT", "INR" in the last 72 hours.  Urinalysis: No results for input(s): "COLORURINE", "LABSPEC", "PHURINE", "GLUCOSEU", "HGBUR",  "BILIRUBINUR", "KETONESUR", "PROTEINUR", "UROBILINOGEN", "NITRITE", "LEUKOCYTESUR" in the last 72 hours.  Invalid input(s): "APPERANCEUR"     Imaging: DG Chest Port 1 View  Result Date: 11/15/2022 CLINICAL DATA:  Central venous catheter in place. EXAM: PORTABLE CHEST 1 VIEW COMPARISON:  Chest radiograph 11/12/2022 and earlier FINDINGS: Of note, the patient is mildly rotated on this portable examination. Interval placement of right IJ central venous catheter with the tip projecting at the level of the superior cavoatrial junction. Stable enlarged cardiac silhouette. New moderate layering right pleural effusion. No left pleural effusion. Mild diffuse hazy interstitial thickening. No pneumothorax. Old bilateral rib fractures. No acute osseous abnormality. Rightward curvature of the distal thoracic spine. IMPRESSION: 1. Interval placement of right IJ central venous catheter with the tip projecting at the level of the superior cavoatrial junction. 2. Moderate layering right pleural effusion, increased compared to most recent exam. No left pleural effusion. 3. Mild pulmonary edema. Electronically Signed   By: Ileana Roup M.D.   On: 11/15/2022 14:14   CARDIAC CATHETERIZATION  Result Date: 11/15/2022 Conclusions: Severely elevated left heart, right heart, and pulmonary artery pressure. Mildly reduced Fick cardiac output/index.  Cardiac output may be overestimated in the setting of significant anemia. Successful placement of a 45F triple-lumen central venous catheter via the right internal jugular vein. Recommendations: Transfer to stepdown. Initiate milrinone 0.25 mcg/kg/min, with further titration based on blood pressure, urine output, and cooximetry. Hold metoprolol in the setting of low cardiac output requiring inotropic therapy. Resume diuresis. Obtain portable chest radiograph when patient reaches ICU to confirm central line placement. Nelva Bush, MD [facility]  ECHOCARDIOGRAM LIMITED  Result  Date: 11/14/2022    ECHOCARDIOGRAM LIMITED REPORT   Patient Name:   Dawn Sherman Date of Exam: 11/14/2022 Medical Rec #:  [id number]     Height:       65.0 in Accession #:    [id number]    Weight:       122.4 lb Date of Birth:  [birth year]    BSA:          1.605 m Patient Age:    74 years      BP:           137/77 mmHg Patient Gender: F             HR:           74 bpm. Exam Location:  [place]  Procedure: 2D Echo, Cardiac Doppler and Color Doppler Indications:     Cardiomyopathy -unspecified I42.9  History:         Patient has prior history of Echocardiogram examinations, most                  recent 09/28/2022.  Sonographer:     Sherrie Sport Referring Phys:  [place] Phys: Ida Rogue MD  Sonographer Comments: Image quality was good. IMPRESSIONS  1. Left ventricular ejection fraction, by estimation, is 25 to 30%. The left ventricle has severely decreased function. The left ventricle demonstrates global hypokinesis. There is moderate left ventricular hypertrophy. Left ventricular diastolic parameters are indeterminate.  2. Right ventricular systolic function is moderately reduced. The right ventricular size is normal. There is moderately elevated pulmonary artery systolic pressure. The estimated right ventricular systolic pressure is [id number] mmHg.  3. Left atrial size was severely dilated.  4. A small pericardial effusion is present.  5. The mitral valve is normal in structure. Severe mitral valve regurgitation. No evidence of mitral stenosis.  6. Tricuspid valve regurgitation is moderate to severe.  7. The aortic valve is normal in structure. Aortic valve regurgitation is not visualized. No aortic stenosis is present.  8. The inferior vena cava is normal in size with greater than 50% respiratory variability, suggesting right atrial pressure of 3 mmHg. FINDINGS  Left Ventricle: Left ventricular ejection fraction, by estimation, is 25 to 30%. The left ventricle has severely decreased function. The left  ventricle demonstrates global hypokinesis. The left ventricular internal cavity size was normal in size. There is moderate left ventricular hypertrophy. Left ventricular diastolic parameters are indeterminate. Right Ventricle: The right ventricular size is normal. No increase in right ventricular wall thickness. Right ventricular systolic function is moderately reduced. There is moderately elevated pulmonary artery systolic pressure. The tricuspid regurgitant velocity is 3.19 m/s, and with an assumed right atrial pressure of 5 mmHg, the estimated right ventricular systolic pressure is [id number] mmHg. Left Atrium: Left atrial size was severely dilated. Right Atrium: Right atrial size was normal in size. Pericardium: A small pericardial effusion is present. Mitral Valve: The mitral valve is normal in structure. There is mild calcification of the mitral valve leaflet(s). Mild mitral annular calcification. Severe mitral valve regurgitation. No evidence of mitral valve stenosis. Tricuspid Valve: The tricuspid valve is normal in structure. Tricuspid valve regurgitation is moderate to severe. No evidence of tricuspid stenosis. Aortic Valve: The aortic valve is normal in structure. Aortic valve regurgitation is not visualized. No aortic stenosis is present. Pulmonic Valve: The pulmonic valve was normal in structure. Pulmonic valve regurgitation is mild. No evidence of pulmonic stenosis. Aorta: The aortic root is normal in size and structure. Venous: The inferior vena cava is normal in size with greater than 50% respiratory variability, suggesting right atrial pressure of 3 mmHg. IAS/Shunts: No atrial level shunt detected by color flow Doppler. Additional Comments: Spectral Doppler performed. Color Doppler performed.  LEFT VENTRICLE PLAX 2D LVIDd:         5.80 cm LVIDs:         4.70 cm LV PW:         1.20 cm LV IVS:        1.20 cm  LV Volumes (MOD) LV vol d, MOD A2C: 159.0 ml LV vol d, MOD A4C: 127.0 ml LV vol s, MOD A2C: 119.0  ml LV vol s, MOD A4C: 107.0 ml LV SV MOD A2C:     40.0  ml LV SV MOD A4C:     127.0 ml LV SV MOD BP:      35.0 ml RIGHT VENTRICLE RV Basal diam:  3.00 cm RV Mid diam:    2.30 cm LEFT ATRIUM            Index LA Vol (A2C): 130.0 ml 80.98 ml/m LA Vol (A4C): 57.0 ml  35.51 ml/m  TRICUSPID VALVE TR Peak grad:   40.7 mmHg TR Vmax:        319.00 cm/s Ida Rogue MD Electronically signed by Ida Rogue MD Signature Date/Time: 11/14/2022/5:02:08 PM    Final (Updated)      Medications:    sodium chloride     furosemide (LASIX) 200 mg in dextrose 5 % 100 mL (2 mg/mL) infusion 5 mg/hr (11/16/22 1047)   milrinone 0.25 mcg/kg/min (11/16/22 1047)    amiodarone  200 mg Oral Once   amiodarone  400 mg Oral BID   apixaban  2.5 mg Oral BID   atorvastatin  40 mg Oral Daily   buPROPion  150 mg Oral Daily   calcium carbonate  1 tablet Oral TID WC   Chlorhexidine Gluconate Cloth  6 each Topical Daily   Chlorhexidine Gluconate Cloth  6 each Topical Q0600   fluticasone furoate-vilanterol  1 puff Inhalation Daily   And   umeclidinium bromide  1 puff Inhalation Daily   hydrALAZINE  10 mg Oral Q6H   loratadine  10 mg Oral Daily   metolazone  2.5 mg Oral Once   sodium chloride flush  10-40 mL Intracatheter Q12H   sodium chloride flush  3 mL Intravenous Q12H   sodium chloride flush  3 mL Intravenous Q12H   venlafaxine XR  150 mg Oral Q breakfast   sodium chloride, acetaminophen **OR** acetaminophen, albuterol, clonazepam, ondansetron **OR** ondansetron (ZOFRAN) IV, senna-docusate, sodium chloride flush, sodium chloride flush  Assessment/ Plan:  [name] is a 67 y.o.  female with hypertension, atrial fibrillation, chronic congestive heart failure, atrophic right kidney, endometriosis, hyperlipidemia, tobacco abuse, GERD, and depression who is admitted to [facility] on 11/12/2022 for Hypoxia [R09.02] Symptomatic anemia [D64.9] Anemia, unspecified type [D64.9] Dyspnea, unspecified type [R06.00]  AKI on  Chronic kidney disease stage IV: with hyperkalemia and proteinuria. Renal function has progressively decreased in the last few months.  - hold home dapagliflozin-can be considered as outpatient -Serum creatinine slowly worsening. - Will continue to monitor for now, avoid nephrotoxic agents   Hypertension and acute exacerbation of systolic congestive heart failure: on torsemide as outpatient.  - Appreciate cardiology input.  -Right heart cath on 11/05/2022 shows severely elevated left heart, right heart and pulmonary artery pressure.  Currently managed with milrinone and IV furosemide.  Anemia with chronic kidney disease and iron deficiency: negative for GI bleed.  -IV iron given on 11/13/22 -Hgb remains 8.2  Secondary Hyperparathyroidism: with hypocalcemia - PTH 92 -Phosphorus 4.1   LOS: 4 Mahki Spikes 3/23/202411:36 AM

## 2022-11-17 ENCOUNTER — Encounter: Payer: Self-pay | Admitting: Internal Medicine

## 2022-11-17 DIAGNOSIS — D649 Anemia, unspecified: Secondary | ICD-10-CM | POA: Diagnosis not present

## 2022-11-17 DIAGNOSIS — N184 Chronic kidney disease, stage 4 (severe): Secondary | ICD-10-CM | POA: Diagnosis not present

## 2022-11-17 DIAGNOSIS — R0600 Dyspnea, unspecified: Secondary | ICD-10-CM | POA: Diagnosis not present

## 2022-11-17 DIAGNOSIS — I5043 Acute on chronic combined systolic (congestive) and diastolic (congestive) heart failure: Secondary | ICD-10-CM | POA: Diagnosis not present

## 2022-11-17 DIAGNOSIS — I4819 Other persistent atrial fibrillation: Secondary | ICD-10-CM | POA: Diagnosis not present

## 2022-11-17 DIAGNOSIS — R0902 Hypoxemia: Secondary | ICD-10-CM | POA: Diagnosis not present

## 2022-11-17 DIAGNOSIS — I480 Paroxysmal atrial fibrillation: Secondary | ICD-10-CM | POA: Diagnosis not present

## 2022-11-17 LAB — BASIC METABOLIC PANEL
Anion gap: 10 (ref 5–15)
BUN: 70 mg/dL — ABNORMAL HIGH (ref 8–23)
CO2: 27 mmol/L (ref 22–32)
Calcium: 8.9 mg/dL (ref 8.9–10.3)
Chloride: 96 mmol/L — ABNORMAL LOW (ref 98–111)
Creatinine, Ser: 3.23 mg/dL — ABNORMAL HIGH (ref 0.44–1.00)
GFR, Estimated: 15 mL/min — ABNORMAL LOW (ref >60)
Glucose, Bld: 103 mg/dL — ABNORMAL HIGH (ref 70–99)
Potassium: 4.1 mmol/L (ref 3.5–5.1)
Sodium: 133 mmol/L — ABNORMAL LOW (ref 135–145)

## 2022-11-17 LAB — COOXEMETRY PANEL
Carboxyhemoglobin: 1.1 % (ref 0.5–1.5)
Methemoglobin: <0.7 % (ref 0.0–1.5)
O2 Saturation: 63.1 %
Total hemoglobin: 8.5 g/dL — ABNORMAL LOW (ref 12.0–16.0)
Total oxygen content: 62.3 %

## 2022-11-17 LAB — MAGNESIUM: Magnesium: 2.3 mg/dL (ref 1.7–2.4)

## 2022-11-17 MED ORDER — AMIODARONE HCL IN DEXTROSE 360-4.14 MG/200ML-% IV SOLN
60.0000 mg/h | INTRAVENOUS | Status: DC
  Administered 2022-11-17: 60 mg/h via INTRAVENOUS
  Filled 2022-11-17 (×2): qty 200

## 2022-11-17 MED ORDER — AMIODARONE HCL IN DEXTROSE 360-4.14 MG/200ML-% IV SOLN
30.0000 mg/h | INTRAVENOUS | Status: DC
  Administered 2022-11-17 – 2022-11-19 (×4): 30 mg/h via INTRAVENOUS
  Filled 2022-11-17 (×4): qty 200

## 2022-11-17 MED ORDER — AMIODARONE LOAD VIA INFUSION
150.0000 mg | Freq: Once | INTRAVENOUS | Status: AC
  Administered 2022-11-17: 150 mg via INTRAVENOUS
  Filled 2022-11-17: qty 83.34

## 2022-11-17 MED ORDER — PANTOPRAZOLE SODIUM 40 MG PO TBEC
40.0000 mg | DELAYED_RELEASE_TABLET | Freq: Every day | ORAL | Status: DC
  Administered 2022-11-17 – 2022-11-26 (×10): 40 mg via ORAL
  Filled 2022-11-17 (×10): qty 1

## 2022-11-17 NOTE — Progress Notes (Signed)
[facility]  ROUNDING NOTE   Subjective:   [name] was admitted to [facility] on 11/12/2022 for Hypoxia [R09.02] Symptomatic anemia [D64.9] Anemia, unspecified type [D64.9] Dyspnea, unspecified type [R06.00]  Patient was last seen by nephrology on 11/20/2021 for her chronic kidney disease by Dr. Holley Sherman.  Patient had increased urine output yesterday at 5.2 L. States that edema is better and breathing is better also. Unfortunately, serum creatinine has increased to 3.23 from 2.91. Furosemide drip on hold for now. In addition patient has arrhythmias and she was started on amiodarone.    Objective:  Vital signs in last 24 hours:  Temp:  [97.6 F (36.4 C)-98.4 F (36.9 C)] 97.6 F (36.4 C) (03/24 0400) Pulse Rate:  [45-126] 72 (03/24 1100) Resp:  [13-20] 16 (03/24 1100) BP: (91-150)/(55-117) 124/82 (03/24 1100) SpO2:  [73 %-100 %] 97 % (03/24 1100) Weight:  [57.1 kg] 57.1 kg (03/24 0730)  Weight change: -3.1 kg Filed Weights   11/15/22 1414 11/16/22 0830 11/17/22 0730  Weight: 75.7 kg 57.7 kg 57.1 kg    Intake/Output: I/O last 3 completed shifts: In: 2197 [P.O.:1890; I.V.:307] Out: 7625 [Urine:7625]   Intake/Output this shift:  Total I/O In: 685.5 [P.O.:650; I.V.:35.5] Out: 775 [Urine:775]  Physical Exam: General: NAD,   Head: Normocephalic, atraumatic. Moist oral mucosal membranes  Eyes: Anicteric  Lungs:  Clear to auscultation  Heart: irregular  Abdomen:  Soft, nontender,   Extremities: Trace peripheral edema.  Neurologic: Nonfocal, moving all four extremities  Skin: No lesions    Basic Metabolic Panel: Recent Labs  Lab 11/13/22 0428 11/14/22 0447 11/14/22 1125 11/15/22 0425 11/15/22 1231 11/15/22 1235 11/16/22 0326 11/17/22 0421  NA 135  --  134* 139 136 137 136 133*  K 5.3*  --  5.2* 5.4* 5.1 5.1 4.7 4.1  CL 103  --  99 100  --   --  103 96*  CO2 26  --  26 25  --   --  26 27  GLUCOSE 123*  --  96 104*  --   --  92 103*  BUN  43*  --  47* 54*  --   --  64* 70*  CREATININE 2.96*  --  2.67* 2.75*  --   --  2.91* 3.23*  CALCIUM 8.4*  --  8.8* 9.5  --   --  9.1 8.9  MG  --  2.6*  --   --   --   --  2.5* 2.3  PHOS  --  4.1  --   --   --   --   --   --      Liver Function Tests: Recent Labs  Lab 11/14/22 0447  AST 24  ALT 20  ALKPHOS 83  BILITOT 1.0  PROT 6.1*  ALBUMIN 3.3*    No results for input(s): "LIPASE", "AMYLASE" in the last 168 hours. No results for input(s): "AMMONIA" in the last 168 hours.  CBC: Recent Labs  Lab 11/12/22 1125 11/12/22 1357 11/13/22 0428 11/14/22 1125 11/15/22 0425 11/15/22 1231 11/15/22 1235  WBC 7.5 8.1 9.2 8.3 9.0  --   --   HGB 7.1* 7.7* 8.0* 7.9* 8.1* 8.2* 8.2*  HCT 23.9* 26.0* 26.5* 26.1* 28.3* 24.0* 24.0*  MCV 82.4 82.3 81.5 82.3 85.5  --   --   PLT 295 308 284 282 270  --   --      Cardiac Enzymes: No results for input(s): "CKTOTAL", "CKMB", "CKMBINDEX", "TROPONINI" in  the last 168 hours.  BNP: Invalid input(s): "POCBNP"  CBG: Recent Labs  Lab 11/15/22 0841 11/15/22 1417  GLUCAP 87 5     Microbiology: Results for orders placed or performed during the hospital encounter of 11/12/22  MRSA Next Gen by PCR, Nasal     Status: None   Collection Time: 11/15/22  2:17 PM   Specimen: Nasal Mucosa; Nasal Swab  Result Value Ref Range Status   MRSA by PCR Next Gen NOT DETECTED NOT DETECTED Final    Comment: (NOTE) The GeneXpert MRSA Assay (FDA approved for NASAL specimens only), is one component of a comprehensive MRSA colonization surveillance program. It is not intended to diagnose MRSA infection nor to guide or monitor treatment for MRSA infections. Test performance is not FDA approved in patients less than 98 years old. Performed at [facility], [address] [place], [place] [address]     Coagulation Studies: No results for input(s): "LABPROT", "INR" in the last 72 hours.  Urinalysis: No results for input(s): "COLORURINE",  "LABSPEC", "PHURINE", "GLUCOSEU", "HGBUR", "BILIRUBINUR", "KETONESUR", "PROTEINUR", "UROBILINOGEN", "NITRITE", "LEUKOCYTESUR" in the last 72 hours.  Invalid input(s): "APPERANCEUR"     Imaging: DG Chest Port 1 View  Result Date: 11/15/2022 CLINICAL DATA:  Central venous catheter in place. EXAM: PORTABLE CHEST 1 VIEW COMPARISON:  Chest radiograph 11/12/2022 and earlier FINDINGS: Of note, the patient is mildly rotated on this portable examination. Interval placement of right IJ central venous catheter with the tip projecting at the level of the superior cavoatrial junction. Stable enlarged cardiac silhouette. New moderate layering right pleural effusion. No left pleural effusion. Mild diffuse hazy interstitial thickening. No pneumothorax. Old bilateral rib fractures. No acute osseous abnormality. Rightward curvature of the distal thoracic spine. IMPRESSION: 1. Interval placement of right IJ central venous catheter with the tip projecting at the level of the superior cavoatrial junction. 2. Moderate layering right pleural effusion, increased compared to most recent exam. No left pleural effusion. 3. Mild pulmonary edema. Electronically Signed   By: Dawn Sherman M.D.   On: 11/15/2022 14:14   CARDIAC CATHETERIZATION  Result Date: 11/15/2022 Conclusions: Severely elevated left heart, right heart, and pulmonary artery pressure. Mildly reduced Fick cardiac output/index.  Cardiac output may be overestimated in the setting of significant anemia. Successful placement of a 2F triple-lumen central venous catheter via the right internal jugular vein. Recommendations: Transfer to stepdown. Initiate milrinone 0.25 mcg/kg/min, with further titration based on blood pressure, urine output, and cooximetry. Hold metoprolol in the setting of low cardiac output requiring inotropic therapy. Resume diuresis. Obtain portable chest radiograph when patient reaches ICU to confirm central line placement. Dawn Bush, MD [facility]    Medications:    sodium chloride     amiodarone     Followed by   amiodarone     furosemide (LASIX) 200 mg in dextrose 5 % 100 mL (2 mg/mL) infusion Stopped (11/17/22 0851)   milrinone 0.25 mcg/kg/min (11/17/22 1103)    apixaban  2.5 mg Oral BID   atorvastatin  40 mg Oral Daily   buPROPion  150 mg Oral Daily   calcium carbonate  1 tablet Oral TID WC   Chlorhexidine Gluconate Cloth  6 each Topical Daily   Chlorhexidine Gluconate Cloth  6 each Topical Q0600   fluticasone furoate-vilanterol  1 puff Inhalation Daily   And   umeclidinium bromide  1 puff Inhalation Daily   hydrALAZINE  10 mg Oral Q6H   loratadine  10 mg Oral Daily  pantoprazole  40 mg Oral Daily   sodium chloride flush  10-40 mL Intracatheter Q12H   sodium chloride flush  3 mL Intravenous Q12H   sodium chloride flush  3 mL Intravenous Q12H   venlafaxine XR  150 mg Oral Q breakfast   sodium chloride, acetaminophen **OR** acetaminophen, albuterol, clonazepam, ondansetron **OR** ondansetron (ZOFRAN) IV, senna-docusate, sodium chloride flush, sodium chloride flush  Assessment/ Plan:  [name] is a 67 y.o.  female with hypertension, atrial fibrillation, chronic congestive heart failure, atrophic right kidney, endometriosis, hyperlipidemia, tobacco abuse, GERD, and depression who is admitted to [facility] on 11/12/2022 for Hypoxia [R09.02] Symptomatic anemia [D64.9] Anemia, unspecified type [D64.9] Dyspnea, unspecified type [R06.00]  AKI on Chronic kidney disease stage IV: with hyperkalemia and proteinuria. Renal function has progressively decreased in the last few months.  - hold home dapagliflozin-can be considered as outpatient -Serum creatinine slowly worsening. - Will continue to monitor for now, no acute indication for dialysis. - Avoid nephrotoxic agents   Hypertension and acute exacerbation of systolic congestive heart failure: on torsemide as outpatient.  - Appreciate cardiology input.  -Right  heart cath on 11/05/2022 shows severely elevated left heart, right heart and pulmonary artery pressure.   -Lasix drip on hold for now.  Can be restarted at a lower dose once heart rate and hemodynamics stabilize.  Patient is also being set up for obtaining CBC.  Anemia with chronic kidney disease and iron deficiency: negative for GI bleed.  -IV iron given on 11/13/22 -Hgb remains 8.2  Secondary Hyperparathyroidism: with hypocalcemia - PTH 92 from 11/13/2021 -Phosphorus 4.1   LOS: 5 Kishia Shackett 3/24/202411:15 AM

## 2022-11-17 NOTE — Progress Notes (Signed)
Rounding Note    Patient Name: Dawn Sherman Date of Encounter: 11/17/2022  [place] Cardiologist: Ida Rogue, MD   Subjective   On rounds this morning, sitting up on the side of the bed, reports feeling fine Systolic pressure in the 0000000 at 8-9:00 Given midodrine 5 mg Blood pressure 124/82, CVP 8-10 Telemetry reviewed, atrial fibrillation/flutter rate 110 up to 130s  [place] 63.1 up from 51, 49, 29 several days ago  Inpatient Medications    Scheduled Meds:  apixaban  2.5 mg Oral BID   atorvastatin  40 mg Oral Daily   buPROPion  150 mg Oral Daily   calcium carbonate  1 tablet Oral TID WC   Chlorhexidine Gluconate Cloth  6 each Topical Daily   Chlorhexidine Gluconate Cloth  6 each Topical Q0600   fluticasone furoate-vilanterol  1 puff Inhalation Daily   And   umeclidinium bromide  1 puff Inhalation Daily   hydrALAZINE  10 mg Oral Q6H   loratadine  10 mg Oral Daily   pantoprazole  40 mg Oral Daily   sodium chloride flush  10-40 mL Intracatheter Q12H   sodium chloride flush  3 mL Intravenous Q12H   sodium chloride flush  3 mL Intravenous Q12H   venlafaxine XR  150 mg Oral Q breakfast   Continuous Infusions:  sodium chloride     amiodarone     Followed by   amiodarone     furosemide (LASIX) 200 mg in dextrose 5 % 100 mL (2 mg/mL) infusion Stopped (11/17/22 0851)   milrinone 0.25 mcg/kg/min (11/17/22 1103)   PRN Meds: sodium chloride, acetaminophen **OR** acetaminophen, albuterol, clonazepam, ondansetron **OR** ondansetron (ZOFRAN) IV, senna-docusate, sodium chloride flush, sodium chloride flush   Vital Signs    Vitals:   11/17/22 0800 11/17/22 0900 11/17/22 1000 11/17/22 1100  BP: 95/75 (!) 91/55 113/68 124/82  Pulse:  (!) 59 (!) 126 72  Resp: 18 19 18 16   Temp:      TempSrc:      SpO2: 99% 100% 100% 97%  Weight:      Height:        Intake/Output Summary (Last 24 hours) at 11/17/2022 1119 Last data filed at 11/17/2022 1103 Gross per 24 hour   Intake 1935.31 ml  Output 5150 ml  Net -3214.69 ml      11/17/2022    7:30 AM 11/16/2022    8:30 AM 11/15/2022    2:14 PM  Last 3 Weights  Weight (lbs) 125 lb 14.4 oz 127 lb 3.3 oz 166 lb 14.2 oz  Weight (kg) 57.108 kg 57.7 kg 75.7 kg      Telemetry    Atrial fibrillation/flutter rate 1 10-1 30- Personally Reviewed  ECG     - Personally Reviewed  Physical Exam   GEN: No acute distress.   Neck:  JVD 8 Cardiac: Irregularly irregular, no murmurs, rubs, or gallops.  Respiratory: Clear to auscultation bilaterally. GI: Soft, nontender, non-distended  MS: No edema; No deformity. Neuro:  Nonfocal  Psych: Normal affect   Labs    High Sensitivity Troponin:   Recent Labs  Lab 11/12/22 1357 11/12/22 1858  TROPONINIHS 10 9     Chemistry Recent Labs  Lab 11/14/22 0447 11/14/22 1125 11/15/22 0425 11/15/22 1231 11/15/22 1235 11/16/22 0326 11/17/22 0421  NA  --    < > 139   < > 137 136 133*  K  --    < > 5.4*   < > 5.1  4.7 4.1  CL  --    < > 100  --   --  103 96*  CO2  --    < > 25  --   --  26 27  GLUCOSE  --    < > 104*  --   --  92 103*  BUN  --    < > 54*  --   --  64* 70*  CREATININE  --    < > 2.75*  --   --  2.91* 3.23*  CALCIUM  --    < > 9.5  --   --  9.1 8.9  MG 2.6*  --   --   --   --  2.5* 2.3  PROT 6.1*  --   --   --   --   --   --   ALBUMIN 3.3*  --   --   --   --   --   --   AST 24  --   --   --   --   --   --   ALT 20  --   --   --   --   --   --   ALKPHOS 83  --   --   --   --   --   --   BILITOT 1.0  --   --   --   --   --   --   GFRNONAA  --    < > 18*  --   --  17* 15*  ANIONGAP  --    < > 14  --   --  7 10   < > = values in this interval not displayed.    Lipids No results for input(s): "CHOL", "TRIG", "HDL", "LABVLDL", "LDLCALC", "CHOLHDL" in the last 168 hours.  Hematology Recent Labs  Lab 11/13/22 0428 11/14/22 1125 11/15/22 0425 11/15/22 1231 11/15/22 1235  WBC 9.2 8.3 9.0  --   --   RBC 3.25* 3.17* 3.31*  --   --   HGB 8.0*  7.9* 8.1* 8.2* 8.2*  HCT 26.5* 26.1* 28.3* 24.0* 24.0*  MCV 81.5 82.3 85.5  --   --   MCH 24.6* 24.9* 24.5*  --   --   MCHC 30.2 30.3 28.6*  --   --   RDW 17.0* 17.1* 17.5*  --   --   PLT 284 282 270  --   --    Thyroid No results for input(s): "TSH", "FREET4" in the last 168 hours.  BNP Recent Labs  Lab 11/12/22 1125 11/12/22 1357  BNP 3,619.9* 3,247.0*    DDimer No results for input(s): "DDIMER" in the last 168 hours.   Radiology    DG Chest Port 1 View  Result Date: 11/15/2022 CLINICAL DATA:  Central venous catheter in place. EXAM: PORTABLE CHEST 1 VIEW COMPARISON:  Chest radiograph 11/12/2022 and earlier FINDINGS: Of note, the patient is mildly rotated on this portable examination. Interval placement of right IJ central venous catheter with the tip projecting at the level of the superior cavoatrial junction. Stable enlarged cardiac silhouette. New moderate layering right pleural effusion. No left pleural effusion. Mild diffuse hazy interstitial thickening. No pneumothorax. Old bilateral rib fractures. No acute osseous abnormality. Rightward curvature of the distal thoracic spine. IMPRESSION: 1. Interval placement of right IJ central venous catheter with the tip projecting at the level of the superior cavoatrial junction. 2. Moderate layering right pleural effusion,  increased compared to most recent exam. No left pleural effusion. 3. Mild pulmonary edema. Electronically Signed   By: Ileana Roup M.D.   On: 11/15/2022 14:14   CARDIAC CATHETERIZATION  Result Date: 11/15/2022 Conclusions: Severely elevated left heart, right heart, and pulmonary artery pressure. Mildly reduced Fick cardiac output/index.  Cardiac output may be overestimated in the setting of significant anemia. Successful placement of a 59F triple-lumen central venous catheter via the right internal jugular vein. Recommendations: Transfer to stepdown. Initiate milrinone 0.25 mcg/kg/min, with further titration based on blood  pressure, urine output, and cooximetry. Hold metoprolol in the setting of low cardiac output requiring inotropic therapy. Resume diuresis. Obtain portable chest radiograph when patient reaches ICU to confirm central line placement. Nelva Bush, MD [facility]   Cardiac Studies   Echo 1. Left ventricular ejection fraction, by estimation, is 25 to 30%. The  left ventricle has severely decreased function. The left ventricle  demonstrates global hypokinesis. There is moderate left ventricular  hypertrophy. Left ventricular diastolic  parameters are indeterminate.   2. Right ventricular systolic function is moderately reduced. The right  ventricular size is normal. There is moderately elevated pulmonary artery  systolic pressure. The estimated right ventricular systolic pressure is  [id number] mmHg.   3. Left atrial size was severely dilated.   4. A small pericardial effusion is present.   5. The mitral valve is normal in structure. Severe mitral valve  regurgitation. No evidence of mitral stenosis.   6. Tricuspid valve regurgitation is moderate to severe.   7. The aortic valve is normal in structure. Aortic valve regurgitation is  not visualized. No aortic stenosis is present.   8. The inferior vena cava is normal in size with greater than 50%  respiratory variability, suggesting right atrial pressure of 3 mmHg.   Right heart catheterization pressures RA (mean): 21 mmHg RV (S/EDP): 62/24 mmHg PA (S/D, mean): 63/38 (46) mmHg PCWP (mean): 34 mmHg  Ao sat: 91% PA sat: 30%  Fick CO: 3.4 L/min Fick CI: 2.0 L/min/m^2  PVR: 3.5 Wood units PAPi: 1.2    Patient Profile     Ms. Kia Carandang is a 67 year old woman with history of smoking, COPD, paroxysmal atrial fibrillation/flutter, moderate MR, chronic kidney disease stage IV, cardiomyopathy ejection fraction 30 to 35% in the setting of atrial fibrillation/flutter on Eliquis who presents to the hospital via cardiology clinic for  worsening shortness of breath, anemia   Assessment & Plan    Acute on chronic diastolic and systolic CHF Severely reduced ejection fraction on echo this admission, continues to drop over the past 6 months Recent stress test with fixed defect, no significant ischemia cardiac catheterization delayed in the setting of stage IV chronic kidney disease -Denies anginal symptoms Right heart catheterization November 15, 2022 RA 21, PA 63/38, PCWP 34. CO 3.4, CI 2.0, SpO2 30%.  COOX improved up to 63 CVP 8-10, creatinine 3.2 BUN 70, trending upwards, bicarb 27 stable Lasix and metolazone on hold for now Temporary use of metolazone 5, improved blood pressure in the setting of acute on chronic renal failure Hydralazine held  Acute on chronic anemia In the setting of renal failure Received 1 unit, hemoglobin 8.1   Paroxysmal atrial fibrillation/flutter Converting to atrial fibrillation 2 nights ago despite being on amiodarone pill Increased amiodarone pill, remains in atrial fibs flutter this morning with rapid rate Eliquis renal dose given weight and renal function Will start amiodarone loading dose and infusion in effort to restore normal sinus rhythm  Beta-blocker held for now in the setting of CHF on milrinone   Chronic kidney disease stage IV anemia, worsening renal failure with diuresis despite milrinone Nephrology following   Long smoking history, COPD Cessation recommended, likely underlying COPD    Total encounter time more than 50 minutes  Greater than 50% was spent in counseling and coordination of care with the patient  For questions or updates, please contact [place] Please consult [email or web address] for contact info under        Signed, Ida Rogue, MD  11/17/2022, 11:19 AM

## 2022-11-17 NOTE — Progress Notes (Signed)
59: Secure chat message sent to Dr. Rockey Situ r/t: patient increasing serum creatinine, BUN, decreasing GFR from previous labs (per chart - Results). Patient continues to be on Lasix gtt and milrinone gtt per [facility] and urine output 1733ml from 1900. Serum sodium decreased to 133 as well. BP 100s-110s/60s-70s. Patient asymptomatic. Clarifying if provider wants to stop or decrease either gtt.   0603: Secure chat unread so staff contacted on call number for Dr. Rockey Situ at this time. Dr. Rockey Situ returned call and is aware. Provider stated that he would need to think about it, however, if patient was symptomatic, midodrine could be ordered in meantime, but no changes to gtts at this time.   Patient is asymptomatic. BPs remain consistent in 100s-110s/60s-70s. Other VSS. No new orders at this time. Will continue to monitor.

## 2022-11-17 NOTE — Progress Notes (Signed)
Progress Note   Patient: Dawn Sherman [id number] DOB: 1956/02/24 DOA: 11/12/2022     5 DOS: the patient was seen and examined on 11/17/2022   Brief hospital course: Ms. Britainy Nemeroff is a 67 year old female with history of heart failure preserved ejection fraction, paroxysmal atrial fibrillation on low-dose Eliquis, history of subarachnoid hemorrhage secondary to mechanical fall, CKD stage IV, COPD, hypertension, who presents to emergency department from cardiology outpatient clinic for chief concerns of symptomatic anemia.  Vitals in the ED showed temperature of 97.8, respiration rate 18, heart rate 66, blood pressure 116/66, SpO2 of 97% on room air.  Serum sodium is 134, potassium 5.2, chloride 99, bicarb 25, BUN 40, serum creatinine of 2.71, nonfasting blood glucose 131, WBC 8.1, hemoglobin 7.7, platelets of 308.  eGFR 19.  BNP elevated at 3247.  High sensitive troponin has been ordered and is in process.  ED treatment: None  3/20: Vital stable on 4 L of oxygen this morning.  Apparently patient developed shortness of breath with flushing and muscle aches while getting blood transfusion which was discontinued at that time UA and transfusion reaction labs were negative.  Chest x-ray with pulmonary vascular congestion s/p IV Lasix and Benadryl.  CBC this morning with hemoglobin of 8.  Anemia panel with iron deficiency.  IV iron supplement ordered. BMP with slight worsening of creatinine to 2.96.  And potassium of 5.3.  Nephrology was also consulted.  3/21: Vitals and labs seems stable.  Remained on 3 L of oxygen.  Cardiology was also consulted and patient will be going for right heart catheterization tomorrow.  3/22: Vital stable, renal function continued to get worse slowly, persistent hyperkalemia with potassium at 5.4 despite getting Veltassa - dose was increased today. Echocardiogram with worsening of EF and right heart catheterization today with significantly elevated pressures.  Patient  was started on milrinone and Lasix infusion by cardiology.  3/23: Vital stable, remained on 3 L of oxygen.  Hemoglobin improved to 10, slight worsening of renal function with creatinine at 2.91, UOP recorded 2350, patient remains on milrinone and Lasix infusion.  3/24: Hemodynamically stable.  Worsening renal function, UOP recorded of more than 5 L in 24 hours, patient also received metolazone.  CVP or 10.  After discussing with cardiology and nephrology we are holding Lasix infusion today.  A-fib with RVR today  Assessment and Plan: * Acute on chronic combined systolic and diastolic CHF (congestive heart failure) (HCC) Repeat echo done with worsening of EF, right heart cath  with significantly elevated pressures. Patient is being transferred to ICU to start milrinone and Lasix infusion. -Continue with strict intake and output -Daily BMP and weight -Appreciate advanced heart failure team as they are managing her heart failure now -Holding Lasix infusion today due to worsening creatinine  Acute hypoxemic respiratory failure (HCC) Likely multifactorial in setting of symptomatic acute on chronic anemia and small bilateral pleural effusion Procalcitonin negative.  Patient uses 2-3 L of oxygen at night at home. -Continue supplemental oxygen-wean as tolerated  Symptomatic anemia Hemoglobin at 10 this morning Patient's baseline hemoglobin over the last 2 months has been 9.5-10.6 S/p partial 1 unit of PRBC as she developed worsening shortness of breath while getting transfusion which was discontinued, patient also received IV Lasix overnight.  Anemia panel with some iron deficiency and also consistent with anemia of chronic disease. Denies any obvious bleeding, patient was on Eliquis at home which is being held. Also received IV iron supplement -Check FOBT-negative -Monitor hemoglobin  Persistent atrial fibrillation (HCC) A-fib with RVR today.  Cardiology recommended amiodarone bolus followed  by IV amiodarone. -Continue Eliquis  Hyperkalemia Resolved -Discontinue Veltassa -Patient is also been started on Lasix infusion -Monitors potassium  CKD (chronic kidney disease), stage IV ([place]) Patient with slight worsening of renal function, creatinine at 3.23. UA with protein urea and BMP with mild hyperkalemia. -Nephrology on board. -Monitor renal function-patient is being started on Lasix infusion-holding today -Avoid nephrotoxins  HLD (hyperlipidemia) Atorvastatin 40 mg daily resumed  Tobacco use Patient reports that she has quit, her last cigarette was 1.5 months ago  Essential hypertension Metoprolol succinate 25 mg daily resumed Hydralazine 5 mg IV every 8 hours as needed for SBP greater than 175, 4 days ordered  Dyslipidemia Atorvastatin 40 mg daily resumed  Depression, major, single episode, mild (HCC) Bupropion 150 mg and venlafaxine 150 mg daily resumed  GERD without esophagitis PPI resumed   Subjective: Patient was seen and examined today.  No new complaints.  Physical Exam: Vitals:   11/17/22 0900 11/17/22 1000 11/17/22 1100 11/17/22 1200  BP: (!) 91/55 113/68 124/82 114/82  Pulse: (!) 59 (!) 126 72 80  Resp: 19 18 16 16   Temp:      TempSrc:      SpO2: 100% 100% 97% 100%  Weight:      Height:       General.  Frail lady, in no acute distress. Pulmonary.  Lungs clear bilaterally, normal respiratory effort. CV.  Irregularly irregular Abdomen.  Soft, nontender, nondistended, BS positive. CNS.  Alert and oriented .  No focal neurologic deficit. Extremities.  No edema, no cyanosis, pulses intact and symmetrical. Psychiatry.  Judgment and insight appears normal.    Data Reviewed: Prior data reviewed  Family Communication:   Disposition: Status is: Inpatient Remains inpatient appropriate because: Severity of illness  Planned Discharge Destination: Home  Time spent: 50 minutes  This record has been created using TEFL teacher. Errors have been sought and corrected,but may not always be located. Such creation errors do not reflect on the standard of care.   Author: Lorella Nimrod, MD 11/17/2022 12:53 PM  For on call review [email or web address].

## 2022-11-17 NOTE — Assessment & Plan Note (Signed)
Spontaneously converted back to sinus rhythm with amiodarone infusion overnight. -Continue amiodarone infusion for another day-most likely will be converted to p.o. tomorrow. -Continue Eliquis

## 2022-11-18 DIAGNOSIS — I4819 Other persistent atrial fibrillation: Secondary | ICD-10-CM | POA: Diagnosis not present

## 2022-11-18 DIAGNOSIS — I5043 Acute on chronic combined systolic (congestive) and diastolic (congestive) heart failure: Secondary | ICD-10-CM | POA: Diagnosis not present

## 2022-11-18 DIAGNOSIS — R0902 Hypoxemia: Secondary | ICD-10-CM | POA: Diagnosis not present

## 2022-11-18 DIAGNOSIS — D649 Anemia, unspecified: Secondary | ICD-10-CM | POA: Diagnosis not present

## 2022-11-18 DIAGNOSIS — R0600 Dyspnea, unspecified: Secondary | ICD-10-CM | POA: Diagnosis not present

## 2022-11-18 LAB — COOXEMETRY PANEL
Carboxyhemoglobin: 1.1 % (ref 0.5–1.5)
Methemoglobin: <0.7 % (ref 0.0–1.5)
O2 Saturation: 73.4 %
Total hemoglobin: 7.9 g/dL — ABNORMAL LOW (ref 12.0–16.0)
Total oxygen content: 72.6 %

## 2022-11-18 LAB — BASIC METABOLIC PANEL
Anion gap: 12 (ref 5–15)
BUN: 86 mg/dL — ABNORMAL HIGH (ref 8–23)
CO2: 27 mmol/L (ref 22–32)
Calcium: 8.8 mg/dL — ABNORMAL LOW (ref 8.9–10.3)
Chloride: 94 mmol/L — ABNORMAL LOW (ref 98–111)
Creatinine, Ser: 3.19 mg/dL — ABNORMAL HIGH (ref 0.44–1.00)
GFR, Estimated: 15 mL/min — ABNORMAL LOW (ref >60)
Glucose, Bld: 91 mg/dL (ref 70–99)
Potassium: 4.3 mmol/L (ref 3.5–5.1)
Sodium: 133 mmol/L — ABNORMAL LOW (ref 135–145)

## 2022-11-18 LAB — PROTEIN ELECTROPHORESIS, SERUM
A/G Ratio: 1.7 (ref 0.7–1.7)
Albumin ELP: 3.8 g/dL (ref 2.9–4.4)
Alpha-1-Globulin: 0.2 g/dL (ref 0.0–0.4)
Alpha-2-Globulin: 0.7 g/dL (ref 0.4–1.0)
Beta Globulin: 0.8 g/dL (ref 0.7–1.3)
Gamma Globulin: 0.4 g/dL (ref 0.4–1.8)
Globulin, Total: 2.2 g/dL (ref 2.2–3.9)
Total Protein ELP: 6 g/dL (ref 6.0–8.5)

## 2022-11-18 LAB — MAGNESIUM: Magnesium: 2.2 mg/dL (ref 1.7–2.4)

## 2022-11-18 NOTE — Progress Notes (Signed)
Rounding Note    Patient Name: Dawn Sherman Date of Encounter: 11/18/2022  [facility] Cardiologist: Ida Rogue, MD   Subjective   Patient seen on AM rounds.  Denies any chest discomfort or shortness of breath.  Oxygen saturations are being maintained on 3 L of O2 via nasal cannula.  She remains on milrinone and amiodarone infusion.  Serum creatinine remained stable at 3.19.  Coox up to 73.4, cvp measured at 4 per nursing documentation.  Furosemide infusion stopped on 11/17/2022. -278 output in the last 24 hours. Blood pressure improved today.  Inpatient Medications    Scheduled Meds:  apixaban  2.5 mg Oral BID   atorvastatin  40 mg Oral Daily   buPROPion  150 mg Oral Daily   calcium carbonate  1 tablet Oral TID WC   Chlorhexidine Gluconate Cloth  6 each Topical Daily   Chlorhexidine Gluconate Cloth  6 each Topical Q0600   fluticasone furoate-vilanterol  1 puff Inhalation Daily   And   umeclidinium bromide  1 puff Inhalation Daily   loratadine  10 mg Oral Daily   pantoprazole  40 mg Oral Daily   sodium chloride flush  10-40 mL Intracatheter Q12H   sodium chloride flush  3 mL Intravenous Q12H   sodium chloride flush  3 mL Intravenous Q12H   venlafaxine XR  150 mg Oral Q breakfast   Continuous Infusions:  sodium chloride     amiodarone 30 mg/hr (11/18/22 1114)   milrinone 0.125 mcg/kg/min (11/18/22 1115)   PRN Meds: sodium chloride, albuterol, clonazepam, senna-docusate, sodium chloride flush, sodium chloride flush   Vital Signs    Vitals:   11/18/22 0700 11/18/22 0800 11/18/22 0900 11/18/22 1000  BP: (!) 120/54 (!) 156/74 (!) 118/55 107/83  Pulse: 72 72 78 71  Resp: 17 18 19 17   Temp:   97.9 F (36.6 C)   TempSrc:   Oral   SpO2: 99% 93% 100% 100%  Weight:      Height:        Intake/Output Summary (Last 24 hours) at 11/18/2022 1128 Last data filed at 11/18/2022 0900 Gross per 24 hour  Intake 1984.64 ml  Output 1750 ml  Net 234.64 ml       11/18/2022    4:09 AM 11/17/2022    7:30 AM 11/16/2022    8:30 AM  Last 3 Weights  Weight (lbs) 121 lb 7.6 oz 125 lb 14.4 oz 127 lb 3.3 oz  Weight (kg) 55.1 kg 57.108 kg 57.7 kg      Telemetry    Sinus with first degree AVB rates 60's - Personally Reviewed  ECG    No new tracings - Personally Reviewed  Physical Exam   GEN: No acute distress.   Neck: + JVD Cardiac: RRR, no murmurs, rubs, or gallops.  Respiratory: Clear upper lobes with diminished bases to auscultation bilaterally.  Respirations are unlabored at rest on 3 L of O2 via nasal cannula GI: Soft, nontender, non-distended  MS: No edema; No deformity. Neuro:  Nonfocal  Psych: Normal affect   Labs    High Sensitivity Troponin:   Recent Labs  Lab 11/12/22 1357 11/12/22 1858  TROPONINIHS 10 9     Chemistry Recent Labs  Lab 11/14/22 0447 11/14/22 1125 11/16/22 0326 11/17/22 0421 11/18/22 0429  NA  --    < > 136 133* 133*  K  --    < > 4.7 4.1 4.3  CL  --    < >  103 96* 94*  CO2  --    < > 26 27 27   GLUCOSE  --    < > 92 103* 91  BUN  --    < > 64* 70* 86*  CREATININE  --    < > 2.91* 3.23* 3.19*  CALCIUM  --    < > 9.1 8.9 8.8*  MG 2.6*  --  2.5* 2.3 2.2  PROT 6.1*  --   --   --   --   ALBUMIN 3.3*  --   --   --   --   AST 24  --   --   --   --   ALT 20  --   --   --   --   ALKPHOS 83  --   --   --   --   BILITOT 1.0  --   --   --   --   GFRNONAA  --    < > 17* 15* 15*  ANIONGAP  --    < > 7 10 12    < > = values in this interval not displayed.    Lipids No results for input(s): "CHOL", "TRIG", "HDL", "LABVLDL", "LDLCALC", "CHOLHDL" in the last 168 hours.  Hematology Recent Labs  Lab 11/13/22 0428 11/14/22 1125 11/15/22 0425 11/15/22 1231 11/15/22 1235  WBC 9.2 8.3 9.0  --   --   RBC 3.25* 3.17* 3.31*  --   --   HGB 8.0* 7.9* 8.1* 8.2* 8.2*  HCT 26.5* 26.1* 28.3* 24.0* 24.0*  MCV 81.5 82.3 85.5  --   --   MCH 24.6* 24.9* 24.5*  --   --   MCHC 30.2 30.3 28.6*  --   --   RDW 17.0* 17.1*  17.5*  --   --   PLT 284 282 270  --   --    Thyroid No results for input(s): "TSH", "FREET4" in the last 168 hours.  BNP Recent Labs  Lab 11/12/22 1125 11/12/22 1357  BNP 3,619.9* 3,247.0*    DDimer No results for input(s): "DDIMER" in the last 168 hours.   Radiology    No results found.  Cardiac Studies  TTE 11/14/22 1. Left ventricular ejection fraction, by estimation, is 25 to 30%. The  left ventricle has severely decreased function. The left ventricle  demonstrates global hypokinesis. There is moderate left ventricular  hypertrophy. Left ventricular diastolic  parameters are indeterminate.   2. Right ventricular systolic function is moderately reduced. The right  ventricular size is normal. There is moderately elevated pulmonary artery  systolic pressure. The estimated right ventricular systolic pressure is  [id number] mmHg.   3. Left atrial size was severely dilated.   4. A small pericardial effusion is present.   5. The mitral valve is normal in structure. Severe mitral valve  regurgitation. No evidence of mitral stenosis.   6. Tricuspid valve regurgitation is moderate to severe.   7. The aortic valve is normal in structure. Aortic valve regurgitation is  not visualized. No aortic stenosis is present.   8. The inferior vena cava is normal in size with greater than 50%  respiratory variability, suggesting right atrial pressure of 3 mmHg.    RHC 11/15/2022 Right heart catheterization pressures RA (mean): 21 mmHg RV (S/EDP): 62/24 mmHg PA (S/D, mean): 63/38 (46) mmHg PCWP (mean): 34 mmHg  Ao sat: 91% PA sat: 30%  Fick CO: 3.4 L/min Fick CI: 2.0 L/min/m^2  PVR: 3.5 Wood units  PAPi: 1.2    Patient Profile     67 y.o. female with a past medical history of current tobacco use, COPD, paroxysmal atrial fibrillation/atrial flutter, moderate MR, chronic kidney disease stage IV, cardiomyopathy with an ejection fraction of 30-35% in the setting of atrial  fibrillation/flutter on apixaban who presents to the hospital via the cardiology clinic for worsening shortness of breath and anemia.  Assessment & Plan    Acute on chronic combined systolic and diastolic congestive heart failure -severely reduced function on echo with LVEF 25-30%, continues to drop -recent stress testing revealed a fixed defect without significant ischemia -denies any chest pain or current worsening shortness of breath -RHC on 11/15/22 with RA 21, PA 63/38, PCWP 34, C.O. 3.4, C.I 2.0 -Coox improved to 73.4 since admission  -CVP recorded at 4 this morning -appears euvolemic on exam -dieretics remain on hold -furosemide drip stopped on 11/17/22 -Milrinone drip decreased to 0.125 mcg/kg/min -GDMT limited by renal function and blood pressure -daily weight, I's&O's, and low sodium diet -would benefit from advance heart failure consult  Acute on chronic anemia -hgb 8.2 -remains stable -no active signs of bleeding -iron given 11/13/22 -daily CBC -has received blood this admission thus far  Paroxysmal atrial fibrillation/flutter -currently sinus on telemetry monitor -continued on apixaban 2.5 mg twice daily for stroke prophylaxis with reduced dosing for weight and kidney function - previously was a-fib/flutter rates 110-130 started on amiodarone drip -currently infusing at 0.5 mg/min -if maintains in sinus can transition to oral dosing in AM -not currently on beta blocker in the setting of CHF on milrinone -continue with cardiac monitoring  Chronic kidney disease stage IV -serum creatinine 3.19 -monitor urine output --488 output in the last 24 hours -dieretics remains on hold -Nephrology continues to follow -per Nephrology no acute indication for dialysis at this time -daily bmp -monitor/trend/replete electrolytes as needed -avoid nephrotoxic agents where able  Long history of tobacco use with COPD -total cessation is recommended -continued on oxygen  therapy    CHA2DS2-VASc Score = 5   This indicates a 7.2% annual risk of stroke. The patient's score is based upon: CHF History: 1 HTN History: 1 Diabetes History: 0 Stroke History: 0 Vascular Disease History: 1 Age Score: 1 Gender Score: 1      For questions or updates, please contact [place] Please consult [email or web address] for contact info under        Signed, Aleksandra Raben, NP  11/18/2022, 11:28 AM

## 2022-11-18 NOTE — Plan of Care (Deleted)
Pt used bipap overnight; back to O2 at 2LPM this am, sat 96%. OOB to chair by OT, and tolerated well. Eating breakfast at present, drank Ensure. Swallowed pills whole without difficulty. No complaints of pain or other distress. Oriented x 3, disoriented to month, but knows the year. Wife at bedside. Voids using external catheter. IV rocephin scheduled for this afternoon. Po prednisone given this am.   Problem: Education: Goal: Knowledge of General Education information will improve Description: Including pain rating scale, medication(s)/side effects and non-pharmacologic comfort measures Outcome: Progressing   Problem: Health Behavior/Discharge Planning: Goal: Ability to manage health-related needs will improve Outcome: Progressing   Problem: Clinical Measurements: Goal: Ability to maintain clinical measurements within normal limits will improve Outcome: Progressing Goal: Will remain free from infection Outcome: Progressing Goal: Diagnostic test results will improve Outcome: Progressing Goal: Respiratory complications will improve Outcome: Progressing Goal: Cardiovascular complication will be avoided Outcome: Progressing   Problem: Activity: Goal: Risk for activity intolerance will decrease Outcome: Progressing   Problem: Nutrition: Goal: Adequate nutrition will be maintained Outcome: Progressing   Problem: Coping: Goal: Level of anxiety will decrease Outcome: Progressing   Problem: Pain Managment: Goal: General experience of comfort will improve Outcome: Progressing   Problem: Safety: Goal: Ability to remain free from injury will improve Outcome: Progressing   Problem: Skin Integrity: Goal: Risk for impaired skin integrity will decrease Outcome: Progressing   Problem: Education: Goal: Understanding of CV disease, CV risk reduction, and recovery process will improve Outcome: Progressing Goal: Individualized Educational Video(s) Outcome: Progressing   Problem:  Activity: Goal: Ability to return to baseline activity level will improve Outcome: Progressing   Problem: Cardiovascular: Goal: Ability to achieve and maintain adequate cardiovascular perfusion will improve Outcome: Progressing Goal: Vascular access site(s) Level 0-1 will be maintained Outcome: Progressing   Problem: Health Behavior/Discharge Planning: Goal: Ability to safely manage health-related needs after discharge will improve Outcome: Progressing

## 2022-11-18 NOTE — Progress Notes (Signed)
Progress Note   Patient: Dawn Sherman [id number] DOB: June 28, 1956 DOA: 11/12/2022     6 DOS: the patient was seen and examined on 11/18/2022   Brief hospital course: [name] is a 67 year old female with history of heart failure preserved ejection fraction, paroxysmal atrial fibrillation on low-dose Eliquis, history of subarachnoid hemorrhage secondary to mechanical fall, CKD stage IV, COPD, hypertension, who presents to emergency department from cardiology outpatient clinic for chief concerns of symptomatic anemia.  Vitals in the ED showed temperature of 97.8, respiration rate 18, heart rate 66, blood pressure 116/66, SpO2 of 97% on room air.  Serum sodium is 134, potassium 5.2, chloride 99, bicarb 25, BUN 40, serum creatinine of 2.71, nonfasting blood glucose 131, WBC 8.1, hemoglobin 7.7, platelets of 308.  eGFR 19.  BNP elevated at 3247.  High sensitive troponin has been ordered and is in process.  ED treatment: None  3/20: Vital stable on 4 L of oxygen this morning.  Apparently patient developed shortness of breath with flushing and muscle aches while getting blood transfusion which was discontinued at that time UA and transfusion reaction labs were negative.  Chest x-ray with pulmonary vascular congestion s/p IV Lasix and Benadryl.  CBC this morning with hemoglobin of 8.  Anemia panel with iron deficiency.  IV iron supplement ordered. BMP with slight worsening of creatinine to 2.96.  And potassium of 5.3.  Nephrology was also consulted.  3/21: Vitals and labs seems stable.  Remained on 3 L of oxygen.  Cardiology was also consulted and patient will be going for right heart catheterization tomorrow.  3/22: Vital stable, renal function continued to get worse slowly, persistent hyperkalemia with potassium at 5.4 despite getting Veltassa - dose was increased today. Echocardiogram with worsening of EF and right heart catheterization today with significantly elevated pressures.  Patient  was started on milrinone and Lasix infusion by cardiology.  3/23: Vital stable, remained on 3 L of oxygen.  Hemoglobin improved to 10, slight worsening of renal function with creatinine at 2.91, UOP recorded 2350, patient remains on milrinone and Lasix infusion.  3/24: Hemodynamically stable.  Worsening renal function, UOP recorded of more than 5 L in 24 hours, patient also received metolazone.  CVP or 10.  After discussing with cardiology and nephrology we are holding Lasix infusion today.  A-fib with RVR today heart rate cardiology switched to p.o. amiodarone with IV bolus and infusion.  3/25: Vital stable.  Converted back to sinus rhythm.  Creatinine at 3.19, nephrology decided to continue holding Lasix for another day.  Milrinone dose was decreased today.  Will remain on amiodarone infusion with a plan to convert to p.o. tomorrow.  Assessment and Plan: * Acute on chronic combined systolic and diastolic CHF (congestive heart failure) (HCC) Repeat echo done with worsening of EF, right heart cath  with significantly elevated pressures. Patient was transferred to ICU to start milrinone and Lasix infusion. -Continue with strict intake and output -Daily BMP and weight -Appreciate advanced heart failure team as they are managing her heart failure now -Holding Lasix infusion for another day as renal functions remained elevated. -Cardiology might start her on p.o. tomorrow -Decreasing the dose of milrinone  Acute hypoxemic respiratory failure (HCC) Likely multifactorial in setting of symptomatic acute on chronic anemia and small bilateral pleural effusion Procalcitonin negative.  Patient uses 2-3 L of oxygen at night at home. -Continue supplemental oxygen-wean as tolerated  Symptomatic anemia Hemoglobin at 10 this morning Patient's baseline hemoglobin over the last  2 months has been 9.5-10.6 S/p partial 1 unit of PRBC as she developed worsening shortness of breath while getting transfusion  which was discontinued, patient also received IV Lasix overnight.  Anemia panel with some iron deficiency and also consistent with anemia of chronic disease. Denies any obvious bleeding, patient was on Eliquis at home which is being held. Also received IV iron supplement -Check FOBT-negative -Monitor hemoglobin  Persistent atrial fibrillation (HCC) Spontaneously converted back to sinus rhythm with amiodarone infusion overnight. -Continue amiodarone infusion for another day-most likely will be converted to p.o. tomorrow. -Continue Eliquis  Hyperkalemia Resolved -Discontinue Veltassa -Patient is also been started on Lasix infusion -Monitors potassium  CKD (chronic kidney disease), stage IV (HCC) Patient with slight worsening of renal function, creatinine at 3.23. UA with protein urea and BMP with mild hyperkalemia. -Nephrology on board. -Monitor renal function-patient is being started on Lasix infusion-holding today -Avoid nephrotoxins  HLD (hyperlipidemia) Atorvastatin 40 mg daily resumed  Tobacco use Patient reports that she has quit, her last cigarette was 1.5 months ago  Essential hypertension Metoprolol succinate 25 mg daily resumed Hydralazine 5 mg IV every 8 hours as needed for SBP greater than 175, 4 days ordered  Dyslipidemia Atorvastatin 40 mg daily resumed  Depression, major, single episode, mild (HCC) Bupropion 150 mg and venlafaxine 150 mg daily resumed  GERD without esophagitis PPI resumed   Subjective: Patient was resting comfortably when seen today.  No new complaints.  Husband at bedside.  Physical Exam: Vitals:   11/18/22 1000 11/18/22 1100 11/18/22 1130 11/18/22 1157  BP: 107/83 (!) 123/59    Pulse: 71 75 73   Resp: 17 12 16    Temp:    97.7 F (36.5 C)  TempSrc:    Oral  SpO2: 100% 98% 98%   Weight:      Height:       General.  Frail lady, in no acute distress. Pulmonary.  Lungs clear bilaterally, normal respiratory effort. CV.  Regular  rate and rhythm, no JVD, rub or murmur. Abdomen.  Soft, nontender, nondistended, BS positive. CNS.  Alert and oriented .  No focal neurologic deficit. Extremities.  No edema, no cyanosis, pulses intact and symmetrical. Psychiatry.  Judgment and insight appears normal.    Data Reviewed: Prior data reviewed  Family Communication: Discussed with husband at bedside  Disposition: Status is: Inpatient Remains inpatient appropriate because: Severity of illness  Planned Discharge Destination: Home  Time spent: 50 minutes  This record has been created using Systems analyst. Errors have been sought and corrected,but may not always be located. Such creation errors do not reflect on the standard of care.   Author: Lorella Nimrod, MD 11/18/2022 1:44 PM  For on call review [email or web address].

## 2022-11-18 NOTE — Progress Notes (Signed)
[facility]  ROUNDING NOTE   Subjective:   Dawn Sherman was admitted to [facility] on 11/12/2022 for Hypoxia [R09.02] Symptomatic anemia [D64.9] Anemia, unspecified type [D64.9] Dyspnea, unspecified type [R06.00]  Patient was last seen by nephrology on 11/20/2021 for her chronic kidney disease by Dr. [name].   Patient seen sitting up in bed in ICU Alert and oriented Appetite remains appropriate without nausea and vomiting Remains on room air No lower extremity edema  Creatinine stable 3.19 Urine output 2.5 L recorded in previous 24 hours  Remains on milrinone and amiodarone drip  Objective:  Vital signs in last 24 hours:  Temp:  [97.9 F (36.6 C)-98.2 F (36.8 C)] 97.9 F (36.6 C) (03/25 0900) Pulse Rate:  [68-126] 78 (03/25 0900) Resp:  [11-28] 19 (03/25 0900) BP: (109-156)/(51-82) 118/55 (03/25 0900) SpO2:  [93 %-100 %] 100 % (03/25 0900) Weight:  [55.1 kg] 55.1 kg (03/25 0409)  Weight change: -0.592 kg Filed Weights   11/16/22 0830 11/17/22 0730 11/18/22 0409  Weight: 57.7 kg 57.1 kg 55.1 kg    Intake/Output: I/O last 3 completed shifts: In: 2589.9 [P.O.:2130; I.V.:459.9] Out: 4275 [Urine:4275]   Intake/Output this shift:  Total I/O In: 42.4 [I.V.:42.4] Out: -   Physical Exam: General: NAD  Head: Normocephalic, atraumatic. Moist oral mucosal membranes  Eyes: Anicteric  Lungs:  Clear to auscultation, normal effort  Heart: irregular  Abdomen:  Soft, nontender, nondistended  Extremities: No peripheral edema.  Neurologic: Nonfocal, moving all four extremities  Skin: No lesions    Basic Metabolic Panel: Recent Labs  Lab 11/14/22 0447 11/14/22 1125 11/15/22 0425 11/15/22 1231 11/15/22 1235 11/16/22 0326 11/17/22 0421 11/18/22 0429  NA  --  134* 139 136 137 136 133* 133*  K  --  5.2* 5.4* 5.1 5.1 4.7 4.1 4.3  CL  --  99 100  --   --  103 96* 94*  CO2  --  26 25  --   --  26 27 27   GLUCOSE  --  96 104*  --   --  92 103* 91  BUN  --   47* 54*  --   --  64* 70* 86*  CREATININE  --  2.67* 2.75*  --   --  2.91* 3.23* 3.19*  CALCIUM  --  8.8* 9.5  --   --  9.1 8.9 8.8*  MG 2.6*  --   --   --   --  2.5* 2.3 2.2  PHOS 4.1  --   --   --   --   --   --   --      Liver Function Tests: Recent Labs  Lab 11/14/22 0447  AST 24  ALT 20  ALKPHOS 83  BILITOT 1.0  PROT 6.1*  ALBUMIN 3.3*    No results for input(s): "LIPASE", "AMYLASE" in the last 168 hours. No results for input(s): "AMMONIA" in the last 168 hours.  CBC: Recent Labs  Lab 11/12/22 1125 11/12/22 1357 11/13/22 0428 11/14/22 1125 11/15/22 0425 11/15/22 1231 11/15/22 1235  WBC 7.5 8.1 9.2 8.3 9.0  --   --   HGB 7.1* 7.7* 8.0* 7.9* 8.1* 8.2* 8.2*  HCT 23.9* 26.0* 26.5* 26.1* 28.3* 24.0* 24.0*  MCV 82.4 82.3 81.5 82.3 85.5  --   --   PLT 295 308 284 282 270  --   --      Cardiac Enzymes: No results for input(s): "CKTOTAL", "CKMB", "CKMBINDEX", "TROPONINI" in the last  168 hours.  BNP: Invalid input(s): "POCBNP"  CBG: Recent Labs  Lab 11/15/22 0841 11/15/22 1417  GLUCAP 87 93     Microbiology: Results for orders placed or performed during the hospital encounter of 11/12/22  MRSA Next Gen by PCR, Nasal     Status: None   Collection Time: 11/15/22  2:17 PM   Specimen: Nasal Mucosa; Nasal Swab  Result Value Ref Range Status   MRSA by PCR Next Gen NOT DETECTED NOT DETECTED Final    Comment: (NOTE) The GeneXpert MRSA Assay (FDA approved for NASAL specimens only), is one component of a comprehensive MRSA colonization surveillance program. It is not intended to diagnose MRSA infection nor to guide or monitor treatment for MRSA infections. Test performance is not FDA approved in patients less than 66 years old. Performed at [facility], [address] [place], [place] [address]     Coagulation Studies: No results for input(s): "LABPROT", "INR" in the last 72 hours.  Urinalysis: No results for input(s): "COLORURINE",  "LABSPEC", "PHURINE", "GLUCOSEU", "HGBUR", "BILIRUBINUR", "KETONESUR", "PROTEINUR", "UROBILINOGEN", "NITRITE", "LEUKOCYTESUR" in the last 72 hours.  Invalid input(s): "APPERANCEUR"     Imaging: No results found.   Medications:    sodium chloride     amiodarone 30 mg/hr (11/18/22 0900)   milrinone 0.25 mcg/kg/min (11/18/22 0900)    apixaban  2.5 mg Oral BID   atorvastatin  40 mg Oral Daily   buPROPion  150 mg Oral Daily   calcium carbonate  1 tablet Oral TID WC   Chlorhexidine Gluconate Cloth  6 each Topical Daily   Chlorhexidine Gluconate Cloth  6 each Topical Q0600   fluticasone furoate-vilanterol  1 puff Inhalation Daily   And   umeclidinium bromide  1 puff Inhalation Daily   loratadine  10 mg Oral Daily   pantoprazole  40 mg Oral Daily   sodium chloride flush  10-40 mL Intracatheter Q12H   sodium chloride flush  3 mL Intravenous Q12H   sodium chloride flush  3 mL Intravenous Q12H   venlafaxine XR  150 mg Oral Q breakfast   sodium chloride, albuterol, clonazepam, senna-docusate, sodium chloride flush, sodium chloride flush  Assessment/ Plan:  Dawn Sherman is a 67 y.o.  female with hypertension, atrial fibrillation, chronic congestive heart failure, atrophic right kidney, endometriosis, hyperlipidemia, tobacco abuse, GERD, and depression who is admitted to [facility] on 11/12/2022 for Hypoxia [R09.02] Symptomatic anemia [D64.9] Anemia, unspecified type [D64.9] Dyspnea, unspecified type [R06.00]  AKI on Chronic kidney disease stage IV: with hyperkalemia and proteinuria. Renal function has progressively decreased in the last few months.  - hold home dapagliflozin-can be considered as outpatient -Creatinine has remained stable today with adequate urine output noted - No acute indication for dialysis - Avoid nephrotoxic agents   Hypertension and acute exacerbation of systolic congestive heart failure: on torsemide as outpatient.  - Appreciate cardiology input.  -Right heart  cath on 11/05/2022 shows severely elevated left heart, right heart and pulmonary artery pressure.   -Diuresis remain held.  Anemia with chronic kidney disease and iron deficiency: negative for GI bleed.  -IV iron given on 11/13/22 -Hgb 8.2  Secondary Hyperparathyroidism: with hypocalcemia - PTH 92 from 11/13/2021 -Phosphorus 4.1   LOS: 6 Mazi Brailsford 3/25/20249:59 AM

## 2022-11-19 DIAGNOSIS — D649 Anemia, unspecified: Secondary | ICD-10-CM | POA: Diagnosis not present

## 2022-11-19 DIAGNOSIS — J449 Chronic obstructive pulmonary disease, unspecified: Secondary | ICD-10-CM

## 2022-11-19 DIAGNOSIS — F419 Anxiety disorder, unspecified: Secondary | ICD-10-CM

## 2022-11-19 DIAGNOSIS — F32A Depression, unspecified: Secondary | ICD-10-CM

## 2022-11-19 DIAGNOSIS — E039 Hypothyroidism, unspecified: Secondary | ICD-10-CM

## 2022-11-19 DIAGNOSIS — Z9181 History of falling: Secondary | ICD-10-CM

## 2022-11-19 DIAGNOSIS — E782 Mixed hyperlipidemia: Secondary | ICD-10-CM

## 2022-11-19 DIAGNOSIS — I441 Atrioventricular block, second degree: Secondary | ICD-10-CM

## 2022-11-19 DIAGNOSIS — Z87891 Personal history of nicotine dependence: Secondary | ICD-10-CM

## 2022-11-19 DIAGNOSIS — K219 Gastro-esophageal reflux disease without esophagitis: Secondary | ICD-10-CM

## 2022-11-19 DIAGNOSIS — I5033 Acute on chronic diastolic (congestive) heart failure: Secondary | ICD-10-CM

## 2022-11-19 DIAGNOSIS — Z9981 Dependence on supplemental oxygen: Secondary | ICD-10-CM

## 2022-11-19 DIAGNOSIS — J441 Chronic obstructive pulmonary disease with (acute) exacerbation: Secondary | ICD-10-CM

## 2022-11-19 DIAGNOSIS — I130 Hypertensive heart and chronic kidney disease with heart failure and stage 1 through stage 4 chronic kidney disease, or unspecified chronic kidney disease: Secondary | ICD-10-CM

## 2022-11-19 DIAGNOSIS — I4819 Other persistent atrial fibrillation: Secondary | ICD-10-CM | POA: Diagnosis not present

## 2022-11-19 DIAGNOSIS — J189 Pneumonia, unspecified organism: Secondary | ICD-10-CM

## 2022-11-19 DIAGNOSIS — Z7901 Long term (current) use of anticoagulants: Secondary | ICD-10-CM

## 2022-11-19 DIAGNOSIS — Z7951 Long term (current) use of inhaled steroids: Secondary | ICD-10-CM

## 2022-11-19 DIAGNOSIS — I5043 Acute on chronic combined systolic (congestive) and diastolic (congestive) heart failure: Secondary | ICD-10-CM | POA: Diagnosis not present

## 2022-11-19 DIAGNOSIS — J9621 Acute and chronic respiratory failure with hypoxia: Secondary | ICD-10-CM

## 2022-11-19 DIAGNOSIS — E538 Deficiency of other specified B group vitamins: Secondary | ICD-10-CM

## 2022-11-19 DIAGNOSIS — I480 Paroxysmal atrial fibrillation: Secondary | ICD-10-CM

## 2022-11-19 DIAGNOSIS — J9601 Acute respiratory failure with hypoxia: Secondary | ICD-10-CM | POA: Diagnosis not present

## 2022-11-19 DIAGNOSIS — N184 Chronic kidney disease, stage 4 (severe): Secondary | ICD-10-CM

## 2022-11-19 DIAGNOSIS — J440 Chronic obstructive pulmonary disease with acute lower respiratory infection: Secondary | ICD-10-CM

## 2022-11-19 DIAGNOSIS — F1011 Alcohol abuse, in remission: Secondary | ICD-10-CM

## 2022-11-19 DIAGNOSIS — M6281 Muscle weakness (generalized): Secondary | ICD-10-CM

## 2022-11-19 LAB — COOXEMETRY PANEL
Carboxyhemoglobin: 0.5 % (ref 0.5–1.5)
Methemoglobin: <0.7 % (ref 0.0–1.5)
O2 Saturation: 76.5 %
Total hemoglobin: 8 g/dL — ABNORMAL LOW (ref 12.0–16.0)
Total oxygen content: 76.1 %

## 2022-11-19 LAB — BASIC METABOLIC PANEL
Anion gap: 8 (ref 5–15)
BUN: 76 mg/dL — ABNORMAL HIGH (ref 8–23)
CO2: 26 mmol/L (ref 22–32)
Calcium: 8.5 mg/dL — ABNORMAL LOW (ref 8.9–10.3)
Chloride: 98 mmol/L (ref 98–111)
Creatinine, Ser: 2.91 mg/dL — ABNORMAL HIGH (ref 0.44–1.00)
GFR, Estimated: 17 mL/min — ABNORMAL LOW (ref >60)
Glucose, Bld: 160 mg/dL — ABNORMAL HIGH (ref 70–99)
Potassium: 4.2 mmol/L (ref 3.5–5.1)
Sodium: 132 mmol/L — ABNORMAL LOW (ref 135–145)

## 2022-11-19 LAB — LIPOPROTEIN A (LPA): Lipoprotein (a): 44.5 nmol/L — ABNORMAL HIGH (ref <75.0)

## 2022-11-19 LAB — MAGNESIUM: Magnesium: 2.2 mg/dL (ref 1.7–2.4)

## 2022-11-19 MED ORDER — CARVEDILOL 3.125 MG PO TABS
3.1250 mg | ORAL_TABLET | Freq: Two times a day (BID) | ORAL | Status: DC
  Administered 2022-11-19: 3.125 mg via ORAL
  Filled 2022-11-19: qty 1

## 2022-11-19 MED ORDER — TORSEMIDE 20 MG PO TABS
40.0000 mg | ORAL_TABLET | Freq: Every day | ORAL | Status: DC
  Administered 2022-11-19 – 2022-11-26 (×8): 40 mg via ORAL
  Filled 2022-11-19 (×8): qty 2

## 2022-11-19 MED ORDER — AMIODARONE HCL 200 MG PO TABS
400.0000 mg | ORAL_TABLET | Freq: Two times a day (BID) | ORAL | Status: DC
  Administered 2022-11-19 – 2022-11-24 (×11): 400 mg via ORAL
  Filled 2022-11-19 (×11): qty 2

## 2022-11-19 MED ORDER — ISOSORB DINITRATE-HYDRALAZINE 20-37.5 MG PO TABS
1.0000 | ORAL_TABLET | Freq: Three times a day (TID) | ORAL | Status: DC
  Administered 2022-11-19 – 2022-11-26 (×19): 1 via ORAL
  Filled 2022-11-19 (×22): qty 1

## 2022-11-19 MED ORDER — DAPAGLIFLOZIN PROPANEDIOL 5 MG PO TABS
10.0000 mg | ORAL_TABLET | Freq: Every day | ORAL | Status: DC
  Administered 2022-11-19 – 2022-11-26 (×8): 10 mg via ORAL
  Filled 2022-11-19 (×3): qty 2
  Filled 2022-11-19: qty 1
  Filled 2022-11-19 (×2): qty 2
  Filled 2022-11-19 (×2): qty 1
  Filled 2022-11-19: qty 2

## 2022-11-19 NOTE — Progress Notes (Signed)
Progress Note   Patient: Dawn Sherman [id number] DOB: [birth year] DOA: 11/12/2022     7 DOS: the patient was seen and examined on 11/19/2022   Brief hospital course: [name] is a 67 year old female with history of heart failure preserved ejection fraction, paroxysmal atrial fibrillation on low-dose Eliquis, history of subarachnoid hemorrhage secondary to mechanical fall, CKD stage IV, COPD, hypertension, who presents to emergency department from cardiology outpatient clinic for chief concerns of symptomatic anemia.  Vitals in the ED showed temperature of 97.8, respiration rate 18, heart rate 66, blood pressure 116/66, SpO2 of 97% on room air.  Serum sodium is 134, potassium 5.2, chloride 99, bicarb 25, BUN 40, serum creatinine of 2.71, nonfasting blood glucose 131, WBC 8.1, hemoglobin 7.7, platelets of 308.  eGFR 19.  BNP elevated at 3247.  High sensitive troponin has been ordered and is in process.  ED treatment: None  3/20: Vital stable on 4 L of oxygen this morning.  Apparently patient developed shortness of breath with flushing and muscle aches while getting blood transfusion which was discontinued at that time UA and transfusion reaction labs were negative.  Chest x-ray with pulmonary vascular congestion s/p IV Lasix and Benadryl.  CBC this morning with hemoglobin of 8.  Anemia panel with iron deficiency.  IV iron supplement ordered. BMP with slight worsening of creatinine to 2.96.  And potassium of 5.3.  Nephrology was also consulted.  3/21: Vitals and labs seems stable.  Remained on 3 L of oxygen.  Cardiology was also consulted and patient will be going for right heart catheterization tomorrow.  3/22: Vital stable, renal function continued to get worse slowly, persistent hyperkalemia with potassium at 5.4 despite getting Veltassa - dose was increased today. Echocardiogram with worsening of EF and right heart catheterization today with significantly elevated pressures.  Patient  was started on milrinone and Lasix infusion by cardiology.  3/23: Vital stable, remained on 3 L of oxygen.  Hemoglobin improved to 10, slight worsening of renal function with creatinine at 2.91, UOP recorded 2350, patient remains on milrinone and Lasix infusion.  3/24: Hemodynamically stable.  Worsening renal function, UOP recorded of more than 5 L in 24 hours, patient also received metolazone.  CVP or 10.  After discussing with cardiology and nephrology we are holding Lasix infusion today.  A-fib with RVR today heart rate cardiology switched to p.o. amiodarone with IV bolus and infusion.  3/25: Vital stable.  Converted back to sinus rhythm.  Creatinine at 3.19, nephrology decided to continue holding Lasix for another day.  Milrinone dose was decreased today.  Will remain on amiodarone infusion with a plan to convert to p.o. tomorrow.  3/26: Hemodynamically stable.  Renal functions with slow improvement, good UOP.  Nephrology restarted [place].  Remained on amiodarone and milrinone infusions.  Most likely will be converted to p.o. amiodarone today.  Assessment and Plan: * Acute on chronic combined systolic and diastolic CHF (congestive heart failure) (HCC) Repeat echo done with worsening of EF, right heart cath  with significantly elevated pressures. Patient was transferred to ICU to start milrinone and Lasix infusion. -Continue with strict intake and output -Daily BMP and weight -Appreciate advanced heart failure team as they are managing her heart failure now -Holding Lasix infusion for another day as renal functions remained elevated. -Cardiology might start her on p.o. tomorrow -Decreasing the dose of milrinone  Acute hypoxemic respiratory failure (HCC) Likely multifactorial in setting of symptomatic acute on chronic anemia and small bilateral pleural  effusion Procalcitonin negative.  Patient uses 2-3 L of oxygen at night at home. -Continue supplemental oxygen-wean as  tolerated  Symptomatic anemia Hemoglobin at 10 this morning Patient's baseline hemoglobin over the last 2 months has been 9.5-10.6 S/p partial 1 unit of PRBC as she developed worsening shortness of breath while getting transfusion which was discontinued, patient also received IV Lasix overnight.  Anemia panel with some iron deficiency and also consistent with anemia of chronic disease. Denies any obvious bleeding, patient was on Eliquis at home which is being held. Also received IV iron supplement -Check FOBT-negative -Monitor hemoglobin  Persistent atrial fibrillation (HCC) Spontaneously converted back to sinus rhythm with amiodarone infusion overnight. -Continue amiodarone infusion for another day-most likely will be converted to p.o. tomorrow. -Continue Eliquis  Hyperkalemia Resolved -Discontinue Veltassa -Patient is also been started on Lasix infusion -Monitors potassium  CKD (chronic kidney disease), stage IV ([place]) Patient with slowly improving renal function now, creatinine at 2.91 today. UA with protein urea  -Nephrology on board. -Monitor renal function-patient is being started on Lasix infusion-holding today -Avoid nephrotoxins  HLD (hyperlipidemia) Atorvastatin 40 mg daily resumed  Tobacco use Patient reports that she has quit, her last cigarette was 1.5 months ago  Essential hypertension Metoprolol succinate 25 mg daily resumed Hydralazine 5 mg IV every 8 hours as needed for SBP greater than 175, 4 days ordered  Dyslipidemia Atorvastatin 40 mg daily resumed  Depression, major, single episode, mild (HCC) Bupropion 150 mg and venlafaxine 150 mg daily resumed  GERD without esophagitis PPI resumed   Subjective: Patient was seen and examined today.  No new complaints.  Physical Exam: Vitals:   11/19/22 1100 11/19/22 1135 11/19/22 1139 11/19/22 1200  BP: (!) 158/117  132/70 130/71  Pulse: 74  73 73  Resp: 19 17 17 18   Temp:      TempSrc:      SpO2: 100%   100% 100%  Weight:      Height:       General.  Frail lady, in no acute distress. Pulmonary.  Lungs clear bilaterally, normal respiratory effort. CV.  Regular rate and rhythm, no JVD, rub or murmur. Abdomen.  Soft, nontender, nondistended, BS positive. CNS.  Alert and oriented .  No focal neurologic deficit. Extremities.  No edema, no cyanosis, pulses intact and symmetrical. Psychiatry.  Judgment and insight appears normal.    Data Reviewed: Prior data reviewed  Family Communication: Discussed with husband at bedside  Disposition: Status is: Inpatient Remains inpatient appropriate because: Severity of illness  Planned Discharge Destination: Home  Time spent: 45 minutes  This record has been created using Systems analyst. Errors have been sought and corrected,but may not always be located. Such creation errors do not reflect on the standard of care.   Author: Lorella Nimrod, MD 11/19/2022 12:14 PM  For on call review [email or web address].

## 2022-11-19 NOTE — Progress Notes (Addendum)
Rounding Note    Patient Name: Dawn Sherman Date of Encounter: 11/19/2022  [place] Cardiologist: Ida Rogue, MD   Subjective   Milrinone was decreased yesterday and she continues to do well.  Mixed venous saturation is 76.5%.  Renal function is improving.  She was started on torsemide 40 mg daily and Farxiga 10 mg daily by nephrology. She is maintaining in sinus rhythm with IV amiodarone.  Inpatient Medications    Scheduled Meds:  apixaban  2.5 mg Oral BID   atorvastatin  40 mg Oral Daily   buPROPion  150 mg Oral Daily   calcium carbonate  1 tablet Oral TID WC   Chlorhexidine Gluconate Cloth  6 each Topical Daily   Chlorhexidine Gluconate Cloth  6 each Topical Q0600   dapagliflozin propanediol  10 mg Oral Daily   fluticasone furoate-vilanterol  1 puff Inhalation Daily   And   umeclidinium bromide  1 puff Inhalation Daily   loratadine  10 mg Oral Daily   pantoprazole  40 mg Oral Daily   sodium chloride flush  10-40 mL Intracatheter Q12H   sodium chloride flush  3 mL Intravenous Q12H   sodium chloride flush  3 mL Intravenous Q12H   torsemide  40 mg Oral Daily   venlafaxine XR  150 mg Oral Q breakfast   Continuous Infusions:  sodium chloride     amiodarone 30 mg/hr (11/19/22 1218)   milrinone 0.125 mcg/kg/min (11/19/22 1200)   PRN Meds: sodium chloride, albuterol, clonazepam, senna-docusate, sodium chloride flush, sodium chloride flush   Vital Signs    Vitals:   11/19/22 1100 11/19/22 1135 11/19/22 1139 11/19/22 1200  BP: (!) 158/117  132/70 130/71  Pulse: 74  73 73  Resp: 19 17 17 18   Temp:      TempSrc:      SpO2: 100%  100% 100%  Weight:      Height:        Intake/Output Summary (Last 24 hours) at 11/19/2022 1248 Last data filed at 11/19/2022 1200 Gross per 24 hour  Intake 1293.6 ml  Output 2500 ml  Net -1206.4 ml       11/19/2022    5:00 AM 11/18/2022    4:09 AM 11/17/2022    7:30 AM  Last 3 Weights  Weight (lbs) 126 lb 1.7 oz  121 lb 7.6 oz 125 lb 14.4 oz  Weight (kg) 57.2 kg 55.1 kg 57.108 kg      Telemetry    Sinus with first degree AVB rates 60's - Personally Reviewed  ECG    No new tracings - Personally Reviewed  Physical Exam   GEN: No acute distress.   Neck: + JVD Cardiac: RRR, no murmurs, rubs, or gallops.  Respiratory: Clear upper lobes with diminished bases to auscultation bilaterally.  Respirations are unlabored at rest on 3 L of O2 via nasal cannula GI: Soft, nontender, non-distended  MS: No edema; No deformity. Neuro:  Nonfocal  Psych: Normal affect   Labs    High Sensitivity Troponin:   Recent Labs  Lab 11/12/22 1357 11/12/22 1858  TROPONINIHS 10 9      Chemistry Recent Labs  Lab 11/14/22 0447 11/14/22 1125 11/17/22 0421 11/18/22 0429 11/19/22 0452  NA  --    < > 133* 133* 132*  K  --    < > 4.1 4.3 4.2  CL  --    < > 96* 94* 98  CO2  --    < >  27 27 26   GLUCOSE  --    < > 103* 91 160*  BUN  --    < > 70* 86* 76*  CREATININE  --    < > 3.23* 3.19* 2.91*  CALCIUM  --    < > 8.9 8.8* 8.5*  MG 2.6*   < > 2.3 2.2 2.2  PROT 6.1*  --   --   --   --   ALBUMIN 3.3*  --   --   --   --   AST 24  --   --   --   --   ALT 20  --   --   --   --   ALKPHOS 83  --   --   --   --   BILITOT 1.0  --   --   --   --   GFRNONAA  --    < > 15* 15* 17*  ANIONGAP  --    < > 10 12 8    < > = values in this interval not displayed.     Lipids No results for input(s): "CHOL", "TRIG", "HDL", "LABVLDL", "LDLCALC", "CHOLHDL" in the last 168 hours.  Hematology Recent Labs  Lab 11/13/22 0428 11/14/22 1125 11/15/22 0425 11/15/22 1231 11/15/22 1235  WBC 9.2 8.3 9.0  --   --   RBC 3.25* 3.17* 3.31*  --   --   HGB 8.0* 7.9* 8.1* 8.2* 8.2*  HCT 26.5* 26.1* 28.3* 24.0* 24.0*  MCV 81.5 82.3 85.5  --   --   MCH 24.6* 24.9* 24.5*  --   --   MCHC 30.2 30.3 28.6*  --   --   RDW 17.0* 17.1* 17.5*  --   --   PLT 284 282 270  --   --     Thyroid No results for input(s): "TSH", "FREET4" in the  last 168 hours.  BNP Recent Labs  Lab 11/12/22 1357  BNP 3,247.0*     DDimer No results for input(s): "DDIMER" in the last 168 hours.   Radiology    No results found.  Cardiac Studies  TTE 11/14/22 1. Left ventricular ejection fraction, by estimation, is 25 to 30%. The  left ventricle has severely decreased function. The left ventricle  demonstrates global hypokinesis. There is moderate left ventricular  hypertrophy. Left ventricular diastolic  parameters are indeterminate.   2. Right ventricular systolic function is moderately reduced. The right  ventricular size is normal. There is moderately elevated pulmonary artery  systolic pressure. The estimated right ventricular systolic pressure is  [id number] mmHg.   3. Left atrial size was severely dilated.   4. A small pericardial effusion is present.   5. The mitral valve is normal in structure. Severe mitral valve  regurgitation. No evidence of mitral stenosis.   6. Tricuspid valve regurgitation is moderate to severe.   7. The aortic valve is normal in structure. Aortic valve regurgitation is  not visualized. No aortic stenosis is present.   8. The inferior vena cava is normal in size with greater than 50%  respiratory variability, suggesting right atrial pressure of 3 mmHg.    RHC 11/15/2022 Right heart catheterization pressures RA (mean): 21 mmHg RV (S/EDP): 62/24 mmHg PA (S/D, mean): 63/38 (46) mmHg PCWP (mean): 34 mmHg  Ao sat: 91% PA sat: 30%  Fick CO: 3.4 L/min Fick CI: 2.0 L/min/m^2  PVR: 3.5 Wood units PAPi: 1.2    Patient Profile  67 y.o. female with a past medical history of current tobacco use, COPD, paroxysmal atrial fibrillation/atrial flutter, moderate MR, chronic kidney disease stage IV, cardiomyopathy with an ejection fraction of 30-35% in the setting of atrial fibrillation/flutter on apixaban who presents to the hospital via the cardiology clinic for worsening shortness of breath and  anemia.  Assessment & Plan    Acute on chronic combined systolic and diastolic congestive heart failure -severely reduced function on echo with LVEF 25-30%, continues to drop -recent stress testing revealed a fixed defect without significant ischemia -RHC on 11/15/22 with RA 21, PA 63/38, PCWP 34, C.O. 3.4, C.I 2.0 -[place] improved to 73.4 since admission  -CVP recorded at 4 this morning -Will discontinue milrinone today and start small dose carvedilol 3.125 mg twice daily. I agree with resuming torsemide 40 mg by mouth daily and Farxiga 10 mg daily. I am going to add small dose of BiDil and if renal function improves, we can switch to [place]. Given progressive drop in her ejection fraction, we should consider left heart catheterization with low contrast at some point during this hospitalization.  Hopefully this can be done once her GFR is above 30.  Acute on chronic anemia -hgb 8.2 -remains stable -no active signs of bleeding -iron given 11/13/22 -daily CBC -has received blood this admission thus far  Paroxysmal atrial fibrillation/flutter -She is maintaining in sinus rhythm.  Will switch amiodarone drip to oral amiodarone with a loading dose. -continued on apixaban 2.5 mg twice daily for stroke prophylaxis with reduced dosing for weight and kidney function  Chronic kidney disease stage IV -Followed by nephrology.  Renal function is gradually improving.  Long history of tobacco use with COPD -total cessation is recommended -continued on oxygen therapy    CHA2DS2-VASc Score = 5   This indicates a 7.2% annual risk of stroke. The patient's score is based upon: CHF History: 1 HTN History: 1 Diabetes History: 0 Stroke History: 0 Vascular Disease History: 1 Age Score: 1 Gender Score: 1      For questions or updates, please contact [place] Please consult [email or web address] for contact info under        Signed, Kathlyn Sacramento, MD  11/19/2022, 12:48 PM

## 2022-11-19 NOTE — Progress Notes (Signed)
[facility]  ROUNDING NOTE   Subjective:   [name] was admitted to [facility] on 11/12/2022 for Hypoxia [R09.02] Symptomatic anemia [D64.9] Anemia, unspecified type [D64.9] Dyspnea, unspecified type [R06.00]  Patient was last seen by nephrology on 11/20/2021 for her chronic kidney disease by Dr. Holley Sherman.   Patient seen sitting up in bed in ICU Alert and oriented Completed breakfast tray at bedside Room air  Creatinine stable 2.91 Urine output 1.65 L  Milrinone and amiodarone drip  Objective:  Vital signs in last 24 hours:  Temp:  [97.4 F (36.3 C)-98.2 F (36.8 C)] 97.4 F (36.3 C) (03/26 0800) Pulse Rate:  [69-90] 79 (03/26 0800) Resp:  [12-21] 13 (03/26 0800) BP: (101-145)/(49-96) 129/56 (03/26 0800) SpO2:  [93 %-100 %] 100 % (03/26 0800) Weight:  [57.2 kg] 57.2 kg (03/26 0500)  Weight change: 0.092 kg Filed Weights   11/17/22 0730 11/18/22 0409 11/19/22 0500  Weight: 57.1 kg 55.1 kg 57.2 kg    Intake/Output: I/O last 3 completed shifts: In: 1677.4 [P.O.:960; I.V.:717.4] Out: 2700 [Urine:2700]   Intake/Output this shift:  Total I/O In: 19 [I.V.:19] Out: -   Physical Exam: General: NAD  Head: Normocephalic, atraumatic. Moist oral mucosal membranes  Eyes: Anicteric  Lungs:  Clear to auscultation, normal effort  Heart: Regular  Abdomen:  Soft, nontender, nondistended  Extremities: No peripheral edema.  Neurologic: Nonfocal, moving all four extremities  Skin: No lesions    Basic Metabolic Panel: Recent Labs  Lab 11/14/22 0447 11/14/22 1125 11/15/22 0425 11/15/22 1231 11/15/22 1235 11/16/22 0326 11/17/22 0421 11/18/22 0429 11/19/22 0452  NA  --    < > 139   < > 137 136 133* 133* 132*  K  --    < > 5.4*   < > 5.1 4.7 4.1 4.3 4.2  CL  --    < > 100  --   --  103 96* 94* 98  CO2  --    < > 25  --   --  26 27 27 26   GLUCOSE  --    < > 104*  --   --  92 103* 91 160*  BUN  --    < > 54*  --   --  64* 70* 86* 76*  CREATININE  --     < > 2.75*  --   --  2.91* 3.23* 3.19* 2.91*  CALCIUM  --    < > 9.5  --   --  9.1 8.9 8.8* 8.5*  MG 2.6*  --   --   --   --  2.5* 2.3 2.2 2.2  PHOS 4.1  --   --   --   --   --   --   --   --    < > = values in this interval not displayed.     Liver Function Tests: Recent Labs  Lab 11/14/22 0447  AST 24  ALT 20  ALKPHOS 83  BILITOT 1.0  PROT 6.1*  ALBUMIN 3.3*    No results for input(s): "LIPASE", "AMYLASE" in the last 168 hours. No results for input(s): "AMMONIA" in the last 168 hours.  CBC: Recent Labs  Lab 11/12/22 1125 11/12/22 1357 11/13/22 0428 11/14/22 1125 11/15/22 0425 11/15/22 1231 11/15/22 1235  WBC 7.5 8.1 9.2 8.3 9.0  --   --   HGB 7.1* 7.7* 8.0* 7.9* 8.1* 8.2* 8.2*  HCT 23.9* 26.0* 26.5* 26.1* 28.3* 24.0* 24.0*  MCV 82.4  82.3 81.5 82.3 85.5  --   --   PLT 295 308 284 282 270  --   --      Cardiac Enzymes: No results for input(s): "CKTOTAL", "CKMB", "CKMBINDEX", "TROPONINI" in the last 168 hours.  BNP: Invalid input(s): "POCBNP"  CBG: Recent Labs  Lab 11/15/22 0841 11/15/22 1417  GLUCAP 87 71     Microbiology: Results for orders placed or performed during the hospital encounter of 11/12/22  MRSA Next Gen by PCR, Nasal     Status: None   Collection Time: 11/15/22  2:17 PM   Specimen: Nasal Mucosa; Nasal Swab  Result Value Ref Range Status   MRSA by PCR Next Gen NOT DETECTED NOT DETECTED Final    Comment: (NOTE) The GeneXpert MRSA Assay (FDA approved for NASAL specimens only), is one component of a comprehensive MRSA colonization surveillance program. It is not intended to diagnose MRSA infection nor to guide or monitor treatment for MRSA infections. Test performance is not FDA approved in patients less than 25 years old. Performed at [facility], [address] [place], [place] [address]     Coagulation Studies: No results for input(s): "LABPROT", "INR" in the last 72 hours.  Urinalysis: No results for input(s):  "COLORURINE", "LABSPEC", "PHURINE", "GLUCOSEU", "HGBUR", "BILIRUBINUR", "KETONESUR", "PROTEINUR", "UROBILINOGEN", "NITRITE", "LEUKOCYTESUR" in the last 72 hours.  Invalid input(s): "APPERANCEUR"     Imaging: No results found.   Medications:    sodium chloride     amiodarone 30 mg/hr (11/19/22 0800)   milrinone 0.125 mcg/kg/min (11/19/22 0800)    apixaban  2.5 mg Oral BID   atorvastatin  40 mg Oral Daily   buPROPion  150 mg Oral Daily   calcium carbonate  1 tablet Oral TID WC   Chlorhexidine Gluconate Cloth  6 each Topical Daily   Chlorhexidine Gluconate Cloth  6 each Topical Q0600   fluticasone furoate-vilanterol  1 puff Inhalation Daily   And   umeclidinium bromide  1 puff Inhalation Daily   loratadine  10 mg Oral Daily   pantoprazole  40 mg Oral Daily   sodium chloride flush  10-40 mL Intracatheter Q12H   sodium chloride flush  3 mL Intravenous Q12H   sodium chloride flush  3 mL Intravenous Q12H   venlafaxine XR  150 mg Oral Q breakfast   sodium chloride, albuterol, clonazepam, senna-docusate, sodium chloride flush, sodium chloride flush  Assessment/ Plan:  [name] is a 67 y.o.  female with hypertension, atrial fibrillation, chronic congestive heart failure, atrophic right kidney, endometriosis, hyperlipidemia, tobacco abuse, GERD, and depression who is admitted to [facility] on 11/12/2022 for Hypoxia [R09.02] Symptomatic anemia [D64.9] Anemia, unspecified type [D64.9] Dyspnea, unspecified type [R06.00]  AKI on Chronic kidney disease stage IV: with hyperkalemia and proteinuria. Renal function has progressively decreased in the last few months.  - hold home dapagliflozin-can be considered as outpatient -Creatinine continues to improve with decent urine output. - No acute indication for dialysis -Will restart Farxiga 10 mg daily. - Avoid nephrotoxic agents   Hypertension and acute exacerbation of systolic congestive heart failure: on torsemide as outpatient.  -  Appreciate cardiology input.  -Right heart cath on 11/05/2022 shows severely elevated left heart, right heart and pulmonary artery pressure.   -Will restart diuretics, torsemide 40 mg daily.  Anemia with chronic kidney disease and iron deficiency: negative for GI bleed.  -IV iron given on 11/13/22   Secondary Hyperparathyroidism: with hypocalcemia - PTH 92 from 11/13/2021 -Calcium remains within  desired range.   LOS: [address] 3/26/20249:57 AM

## 2022-11-20 ENCOUNTER — Inpatient Hospital Stay: Payer: Medicare Other

## 2022-11-20 DIAGNOSIS — I5043 Acute on chronic combined systolic (congestive) and diastolic (congestive) heart failure: Secondary | ICD-10-CM | POA: Diagnosis not present

## 2022-11-20 DIAGNOSIS — D508 Other iron deficiency anemias: Secondary | ICD-10-CM | POA: Diagnosis not present

## 2022-11-20 LAB — BASIC METABOLIC PANEL
Anion gap: 8 (ref 5–15)
BUN: 73 mg/dL — ABNORMAL HIGH (ref 8–23)
CO2: 27 mmol/L (ref 22–32)
Calcium: 8.4 mg/dL — ABNORMAL LOW (ref 8.9–10.3)
Chloride: 100 mmol/L (ref 98–111)
Creatinine, Ser: 2.82 mg/dL — ABNORMAL HIGH (ref 0.44–1.00)
GFR, Estimated: 18 mL/min — ABNORMAL LOW (ref >60)
Glucose, Bld: 109 mg/dL — ABNORMAL HIGH (ref 70–99)
Potassium: 4.7 mmol/L (ref 3.5–5.1)
Sodium: 135 mmol/L (ref 135–145)

## 2022-11-20 LAB — MAGNESIUM: Magnesium: 2.1 mg/dL (ref 1.7–2.4)

## 2022-11-20 MED ORDER — CARVEDILOL 6.25 MG PO TABS
6.2500 mg | ORAL_TABLET | Freq: Two times a day (BID) | ORAL | Status: DC
  Administered 2022-11-20 – 2022-11-26 (×12): 6.25 mg via ORAL
  Filled 2022-11-20 (×11): qty 1

## 2022-11-20 NOTE — Evaluation (Signed)
Physical Therapy Evaluation Patient Details Name: Dawn Sherman MRN: [id number] DOB: 1956-01-14 Today's Date: 11/20/2022  History of Present Illness  Pt admitted for acute/chronic CHF with complaints of symptomatic anemia. History includes heart failure, recent cardiac cath, hemorrhage secondary to fall, CKD, COPD, and HTN.  Clinical Impression  Pt is a pleasant 67 year old female who was admitted for acute/chronic CHF. Pt demonstrates all bed mobility/transfers/ambulation at baseline level without AD. No SOB symptoms noted. Pt does not require any further PT needs at this time. Pt will be dc in house and does not require follow up. RN aware. Will dc current orders.  SaO2 on room air at rest = 91% SaO2 on room air while ambulating = 97% SaO2 on 0 liters of O2 while ambulating = n/a%       Recommendations for follow up therapy are one component of a multi-disciplinary discharge planning process, led by the attending physician.  Recommendations may be updated based on patient status, additional functional criteria and insurance authorization.  Follow Up Recommendations       Assistance Recommended at Discharge PRN  Patient can return home with the following  Help with stairs or ramp for entrance;Assist for transportation    Equipment Recommendations None recommended by PT  Recommendations for Other Services       Functional Status Assessment Patient has not had a recent decline in their functional status     Precautions / Restrictions Precautions Precautions: Fall Restrictions Weight Bearing Restrictions: No      Mobility  Bed Mobility Overal bed mobility: Independent             General bed mobility comments: seated at EOB upon arrival    Transfers Overall transfer level: Independent Equipment used: None               General transfer comment: safe technique with upright posture. All mobility performed on RA    Ambulation/Gait Ambulation/Gait  assistance: Supervision Gait Distance (Feet): 200 Feet Assistive device: None Gait Pattern/deviations: WFL(Within Functional Limits)       General Gait Details: ambulated with safe technique including reciprocal gait pattern and symmetrical steps. No LOB and able to carry conversation with exertion. Sats maintained at 97% with exertion  Stairs            Wheelchair Mobility    Modified Rankin (Stroke Patients Only)       Balance Overall balance assessment: No apparent balance deficits (not formally assessed)                                           Pertinent Vitals/Pain Pain Assessment Pain Assessment: No/denies pain    Home Living Family/patient expects to be discharged to:: Private residence Living Arrangements: Spouse/significant other (grandson) Available Help at Discharge: Family;Available 24 hours/day Type of Home: House Home Access: Stairs to enter Entrance Stairs-Rails: None Entrance Stairs-Number of Steps: 3-4   Home Layout: One level Home Equipment: Cane - single Barista (2 wheels)      Prior Function Prior Level of Function : Independent/Modified Independent             Mobility Comments: Denies fall history; home O2 at night (3L) since last admission       Hand Dominance        Extremity/Trunk Assessment   Upper Extremity Assessment Upper Extremity Assessment: Overall WFL for tasks  assessed    Lower Extremity Assessment Lower Extremity Assessment: Overall WFL for tasks assessed       Communication   Communication: No difficulties  Cognition Arousal/Alertness: Awake/alert Behavior During Therapy: WFL for tasks assessed/performed Overall Cognitive Status: Within Functional Limits for tasks assessed                                          General Comments      Exercises     Assessment/Plan    PT Assessment Patient does not need any further PT services  PT Problem List          PT Treatment Interventions      PT Goals (Current goals can be found in the Care Plan section)  Acute Rehab PT Goals Patient Stated Goal: to go home PT Goal Formulation: With patient Time For Goal Achievement: 11/20/22 Potential to Achieve Goals: Good    Frequency       Co-evaluation               AM-PAC PT "6 Clicks" Mobility  Outcome Measure Help needed turning from your back to your side while in a flat bed without using bedrails?: None Help needed moving from lying on your back to sitting on the side of a flat bed without using bedrails?: None Help needed moving to and from a bed to a chair (including a wheelchair)?: None Help needed standing up from a chair using your arms (e.g., wheelchair or bedside chair)?: None Help needed to walk in hospital room?: None Help needed climbing 3-5 steps with a railing? : None 6 Click Score: 24    End of Session   Activity Tolerance: Patient tolerated treatment well Patient left: in bed (seated at EOB) Nurse Communication: Mobility status PT Visit Diagnosis: Difficulty in walking, not elsewhere classified (R26.2)    Time: [id number] PT Time Calculation (min) (ACUTE ONLY): 10 min   Charges:   PT Evaluation $PT Eval Low Complexity: 1 Low          Greggory Stallion, PT, DPT, GCS [phone]   Jowel Waltner 11/20/2022, 4:27 PM

## 2022-11-20 NOTE — Evaluation (Addendum)
Occupational Therapy Evaluation Patient Details Name: Dawn Sherman MRN: [id number] DOB: November 23, 1955 Today's Date: 11/20/2022   History of Present Illness Pt admitted for acute/chronic CHF with complaints of symptomatic anemia. History includes heart failure, recent cardiac cath, hemorrhage secondary to fall, CKD, COPD, and HTN.   Clinical Impression   Patient agreeable to OT evaluation. Pt is A&Ox4 with good safety awareness. Pt appears to be performing ADLs and functional mobility at baseline. No functional deficits noted at this time. No further OT needs identified. Pt is in agreement. Please reconsult if there is a change in functional status.    Recommendations for follow up therapy are one component of a multi-disciplinary discharge planning process, led by the attending physician.  Recommendations may be updated based on patient status, additional functional criteria and insurance authorization.   Assistance Recommended at Discharge PRN  Patient can return home with the following Assistance with cooking/housework;Assist for transportation;Help with stairs or ramp for entrance    Functional Status Assessment  Patient has not had a recent decline in their functional status  Equipment Recommendations  None recommended by OT    Recommendations for Other Services       Precautions / Restrictions Precautions Precautions: Fall Restrictions Weight Bearing Restrictions: No      Mobility Bed Mobility Overal bed mobility: Independent                  Transfers Overall transfer level: Independent Equipment used: None               General transfer comment: STS from EOB and toilet      Balance Overall balance assessment: No apparent balance deficits (not formally assessed)  General comments (skin integrity, edema, etc.): Pt on 2L O2 via [place] upon arrival, SpO2 100% at rest. Trialed functional mobility to the bathroom on RA, SpO2 89-90% with activity. Placed back on  2L for recovery at end of session, RN aware.            ADL either performed or assessed with clinical judgement   ADL Overall ADL's : At baseline     General ADL Comments: Pt completed functional mobility to the bathroom with supervision and no AD, toilet transfer from regular height toilet independently, LB dressing with Mod I, and sinkside grooming with Mod I.     Vision Baseline Vision/History: 1 Wears glasses Patient Visual Report: No change from baseline       Perception     Praxis      Pertinent Vitals/Pain Pain Assessment Pain Assessment: No/denies pain     Hand Dominance Right   Extremity/Trunk Assessment Upper Extremity Assessment Upper Extremity Assessment: Overall WFL for tasks assessed   Lower Extremity Assessment Lower Extremity Assessment: Overall WFL for tasks assessed       Communication Communication Communication: No difficulties   Cognition Arousal/Alertness: Awake/alert Behavior During Therapy: WFL for tasks assessed/performed Overall Cognitive Status: Within Functional Limits for tasks assessed         General Comments       Exercises Other Exercises Other Exercises: OT provided education re: role of OT, OT POC, post acute recs, sitting up for all meals, EOB/OOB mobility with assistance, home/fall safety.     Shoulder Instructions      Home Living Family/patient expects to be discharged to:: Private residence Living Arrangements: Spouse/significant other;Other relatives (grandson) Available Help at Discharge: Family;Available 24 hours/day Type of Home: House Home Access: Stairs to enter [employer] of Steps: 3-4 Entrance  Stairs-Rails: None Home Layout: One level     Bathroom Shower/Tub: Teacher, early years/pre: Standard     Home Equipment: Cane - single Barista (2 wheels)          Prior Functioning/Environment Prior Level of Function : Independent/Modified Independent;Driving              Mobility Comments: Denies fall history; home O2 at night (3L) since last admission ADLs Comments: Independent for ADLs, pt and husband share responsibility for IADLs        OT Problem List:        OT Treatment/Interventions:      OT Goals(Current goals can be found in the care plan section) Acute Rehab OT Goals Patient Stated Goal: return home OT Goal Formulation: All assessment and education complete, DC therapy  OT Frequency:      Co-evaluation              AM-PAC OT "6 Clicks" Daily Activity     Outcome Measure Help from another person eating meals?: None Help from another person taking care of personal grooming?: None Help from another person toileting, which includes using toliet, bedpan, or urinal?: None Help from another person bathing (including washing, rinsing, drying)?: None Help from another person to put on and taking off regular upper body clothing?: None Help from another person to put on and taking off regular lower body clothing?: None 6 Click Score: 24   End of Session Nurse Communication: Mobility status  Activity Tolerance: Patient tolerated treatment well Patient left: in bed;with call bell/phone within reach;with bed alarm set  OT Visit Diagnosis: Other abnormalities of gait and mobility (R26.89)                Time: [id number] OT Time Calculation (min): 17 min Charges:  OT General Charges $OT Visit: 1 Visit OT Evaluation $OT Eval Low Complexity: 1 Low  [facility] MS, OTR/L ascom [phone]  11/20/22, 5:19 PM

## 2022-11-20 NOTE — Progress Notes (Signed)
Progress Note   Patient: Dawn Sherman [id number] DOB: Jul 10, 1956 DOA: 11/12/2022     8 DOS: the patient was seen and examined on 11/20/2022   Brief hospital course: Ms. Nadra Oni is a 67 year old female with history of heart failure preserved ejection fraction, paroxysmal atrial fibrillation on low-dose Eliquis, history of subarachnoid hemorrhage secondary to mechanical fall, CKD stage IV, COPD, hypertension, who presents to emergency department from cardiology outpatient clinic for chief concerns of symptomatic anemia.   Vitals in the ED showed temperature of 97.8, respiration rate 18, heart rate 66, blood pressure 116/66, SpO2 of 97% on room air.   Serum sodium is 134, potassium 5.2, chloride 99, bicarb 25, BUN 40, serum creatinine of 2.71, nonfasting blood glucose 131, WBC 8.1, hemoglobin 7.7, platelets of 308.  eGFR 19.  BNP elevated at 3247.   High sensitive troponin has been ordered and is in process.   ED treatment: None   3/20: Vital stable on 4 L of oxygen this morning.  Apparently patient developed shortness of breath with flushing and muscle aches while getting blood transfusion which was discontinued at that time UA and transfusion reaction labs were negative.  Chest x-ray with pulmonary vascular congestion s/p IV Lasix and Benadryl.  CBC this morning with hemoglobin of 8.  Anemia panel with iron deficiency.  IV iron supplement ordered. BMP with slight worsening of creatinine to 2.96.  And potassium of 5.3.  Nephrology was also consulted.   3/21: Vitals and labs seems stable.  Remained on 3 L of oxygen.  Cardiology was also consulted and patient will be going for right heart catheterization tomorrow.   3/22: Vital stable, renal function continued to get worse slowly, persistent hyperkalemia with potassium at 5.4 despite getting Veltassa - dose was increased today. Echocardiogram with worsening of EF and right heart catheterization today with significantly elevated pressures.   Patient was started on milrinone and Lasix infusion by cardiology.   3/23: Vital stable, remained on 3 L of oxygen.  Hemoglobin improved to 10, slight worsening of renal function with creatinine at 2.91, UOP recorded 2350, patient remains on milrinone and Lasix infusion.   3/24: Hemodynamically stable.  Worsening renal function, UOP recorded of more than 5 L in 24 hours, patient also received metolazone.  CVP or 10.  After discussing with cardiology and nephrology we are holding Lasix infusion today.  A-fib with RVR today heart rate cardiology switched to p.o. amiodarone with IV bolus and infusion.   3/25: Vital stable.  Converted back to sinus rhythm.  Creatinine at 3.19, nephrology decided to continue holding Lasix for another day.  Milrinone dose was decreased today.  Will remain on amiodarone infusion with a plan to convert to p.o. tomorrow.   3/26: Hemodynamically stable.  Renal functions with slow improvement, good UOP.  Nephrology restarted [place].  Remained on amiodarone and milrinone infusions.  Most likely will be converted to p.o. amiodarone today.  3/27: continues to improve. Off milronone. Coreg increased. Cardiology tentatively planning on left heart cath  Assessment and Plan: * Acute on chronic combined systolic and diastolic CHF (congestive heart failure) ([place]) Repeat echo done with worsening of EF, right heart cath  with significantly elevated pressures. Patient was transferred to ICU to start milrinone and Lasix infusion. Now off those -Continue with strict intake and output -Daily BMP and weight -Appreciate advanced heart failure team as they are managing her heart failure now -continue torsemide and farxiga and bi-dil. Coreg dose increased - possible LHC this  hospitalization  Acute hypoxemic respiratory failure (HCC) Likely multifactorial in setting of symptomatic acute on chronic anemia and small bilateral pleural effusion Procalcitonin negative.  Patient uses 2-3 L of  oxygen at night at home. -Continue supplemental oxygen-wean as tolerated  Symptomatic anemia stable Patient's baseline hemoglobin over the last 2 months has been 9.5-10.6 S/p partial 1 unit of PRBC as she developed worsening shortness of breath while getting transfusion which was discontinued, patient also received IV Lasix overnight.  Anemia panel with some iron deficiency and also consistent with anemia of chronic disease. Last colonoscopy 2015. No hx of gi bleed Denies any obvious bleeding or melena Apixaban has been re-started Will review case with GI today  Persistent atrial fibrillation ([place]) Spontaneously converted back to sinus rhythm with amiodarone infusion. Now on po amiodarone, see cardiology note for taper instructions. Also on apixaban  Hyperkalemia Resolved -Discontinue Veltassa -Patient is also been started on Lasix infusion -Monitors potassium  CKD (chronic kidney disease), stage IV ([place]) Patient with slowly improving renal function now, creatinine at 2.82 today., baseline ckd 4 with cr of ~2.5 - nephrology following  HLD (hyperlipidemia) Atorvastatin 40 mg daily resumed  Tobacco use Patient reports that she has quit, her last cigarette was 1.5 months ago  Essential hypertension Bp appropriate on above meds  Dyslipidemia Atorvastatin 40 mg daily resumed  Depression, major, single episode, mild (HCC) Bupropion 150 mg and venlafaxine 150 mg daily resumed  GERD without esophagitis PPI resumed   Subjective: Patient was seen and examined today.  No new complaints. Feeling well. No chest pain or dyspnea  Physical Exam: Vitals:   11/20/22 0600 11/20/22 0700 11/20/22 0800 11/20/22 0900  BP: (!) 118/56 133/75 139/77 129/62  Pulse: 73 74 77 79  Resp: 15 18 15 16   Temp:  98 F (36.7 C)    TempSrc:      SpO2: 100% 100% 100% 99%  Weight:      Height:       General.  Frail lady, in no acute distress. Pulmonary.  Lungs clear bilaterally, normal respiratory  effort. CV.  Regular rate and rhythm, no JVD, rub or murmur. Abdomen.  Soft, nontender, nondistended, BS positive. CNS.  Alert and oriented .  No focal neurologic deficit. Extremities.  No edema, no cyanosis, pulses intact and symmetrical. Psychiatry.  Judgment and insight appears normal.    Data Reviewed: Prior data reviewed  Family Communication: none at bedside  Disposition: Status is: Inpatient Remains inpatient appropriate because: Severity of illness  Planned Discharge Destination: Home?, will order pt/ot consults  Time spent: 35 min  Author: Desma Maxim, MD 11/20/2022 11:08 AM  For on call review [email or web address].

## 2022-11-20 NOTE — Progress Notes (Signed)
Rounding Note    Patient Name: Dawn Sherman Date of Encounter: 11/20/2022  [place] Cardiologist: Ida Rogue, MD   Subjective   Milrinone was stopped yesterday and she continues to feel better every day.  Renal function is gradually improving.  She is maintaining in sinus rhythm.  Inpatient Medications    Scheduled Meds:  amiodarone  400 mg Oral BID   apixaban  2.5 mg Oral BID   atorvastatin  40 mg Oral Daily   buPROPion  150 mg Oral Daily   calcium carbonate  1 tablet Oral TID WC   carvedilol  6.25 mg Oral BID WC   Chlorhexidine Gluconate Cloth  6 each Topical Daily   Chlorhexidine Gluconate Cloth  6 each Topical Q0600   dapagliflozin propanediol  10 mg Oral Daily   fluticasone furoate-vilanterol  1 puff Inhalation Daily   And   umeclidinium bromide  1 puff Inhalation Daily   isosorbide-hydrALAZINE  1 tablet Oral TID   loratadine  10 mg Oral Daily   pantoprazole  40 mg Oral Daily   sodium chloride flush  10-40 mL Intracatheter Q12H   sodium chloride flush  3 mL Intravenous Q12H   sodium chloride flush  3 mL Intravenous Q12H   torsemide  40 mg Oral Daily   venlafaxine XR  150 mg Oral Q breakfast   Continuous Infusions:  sodium chloride     PRN Meds: sodium chloride, albuterol, clonazepam, senna-docusate, sodium chloride flush, sodium chloride flush   Vital Signs    Vitals:   11/20/22 0300 11/20/22 0400 11/20/22 0500 11/20/22 0600  BP: 125/68 131/69 (!) 118/58 (!) 118/56  Pulse: 73 76 72 73  Resp: 15 11 13 15   Temp:  98.2 F (36.8 C)    TempSrc:  Oral    SpO2: 97% 96% 100% 100%  Weight:   57.2 kg   Height:        Intake/Output Summary (Last 24 hours) at 11/20/2022 0823 Last data filed at 11/20/2022 0531 Gross per 24 hour  Intake 946.45 ml  Output 2250 ml  Net -1303.55 ml       11/20/2022    5:00 AM 11/19/2022    5:00 AM 11/18/2022    4:09 AM  Last 3 Weights  Weight (lbs) 126 lb 1.7 oz 126 lb 1.7 oz 121 lb 7.6 oz  Weight (kg) 57.2  kg 57.2 kg 55.1 kg      Telemetry    Sinus with first degree AVB rates 60's - Personally Reviewed  ECG    No new tracings - Personally Reviewed  Physical Exam   GEN: No acute distress.   Neck: No JVD Cardiac: RRR, no murmurs, rubs, or gallops.  Respiratory: Clear to auscultation GI: Soft, nontender, non-distended  MS: No edema; No deformity. Neuro:  Nonfocal  Psych: Normal affect   Labs    High Sensitivity Troponin:   Recent Labs  Lab 11/12/22 1357 11/12/22 1858  TROPONINIHS 10 9      Chemistry Recent Labs  Lab 11/14/22 0447 11/14/22 1125 11/18/22 0429 11/19/22 0452 11/20/22 0613  NA  --    < > 133* 132* 135  K  --    < > 4.3 4.2 4.7  CL  --    < > 94* 98 100  CO2  --    < > 27 26 27   GLUCOSE  --    < > 91 160* 109*  BUN  --    < >  86* 76* 73*  CREATININE  --    < > 3.19* 2.91* 2.82*  CALCIUM  --    < > 8.8* 8.5* 8.4*  MG 2.6*   < > 2.2 2.2 2.1  PROT 6.1*  --   --   --   --   ALBUMIN 3.3*  --   --   --   --   AST 24  --   --   --   --   ALT 20  --   --   --   --   ALKPHOS 83  --   --   --   --   BILITOT 1.0  --   --   --   --   GFRNONAA  --    < > 15* 17* 18*  ANIONGAP  --    < > 12 8 8    < > = values in this interval not displayed.     Lipids No results for input(s): "CHOL", "TRIG", "HDL", "LABVLDL", "LDLCALC", "CHOLHDL" in the last 168 hours.  Hematology Recent Labs  Lab 11/14/22 1125 11/15/22 0425 11/15/22 1231 11/15/22 1235  WBC 8.3 9.0  --   --   RBC 3.17* 3.31*  --   --   HGB 7.9* 8.1* 8.2* 8.2*  HCT 26.1* 28.3* 24.0* 24.0*  MCV 82.3 85.5  --   --   MCH 24.9* 24.5*  --   --   MCHC 30.3 28.6*  --   --   RDW 17.1* 17.5*  --   --   PLT 282 270  --   --     Thyroid No results for input(s): "TSH", "FREET4" in the last 168 hours.  BNP No results for input(s): "BNP", "PROBNP" in the last 168 hours.   DDimer No results for input(s): "DDIMER" in the last 168 hours.   Radiology    No results found.  Cardiac Studies  TTE 11/14/22 1.  Left ventricular ejection fraction, by estimation, is 25 to 30%. The  left ventricle has severely decreased function. The left ventricle  demonstrates global hypokinesis. There is moderate left ventricular  hypertrophy. Left ventricular diastolic  parameters are indeterminate.   2. Right ventricular systolic function is moderately reduced. The right  ventricular size is normal. There is moderately elevated pulmonary artery  systolic pressure. The estimated right ventricular systolic pressure is  [id number] mmHg.   3. Left atrial size was severely dilated.   4. A small pericardial effusion is present.   5. The mitral valve is normal in structure. Severe mitral valve  regurgitation. No evidence of mitral stenosis.   6. Tricuspid valve regurgitation is moderate to severe.   7. The aortic valve is normal in structure. Aortic valve regurgitation is  not visualized. No aortic stenosis is present.   8. The inferior vena cava is normal in size with greater than 50%  respiratory variability, suggesting right atrial pressure of 3 mmHg.    RHC 11/15/2022 Right heart catheterization pressures RA (mean): 21 mmHg RV (S/EDP): 62/24 mmHg PA (S/D, mean): 63/38 (46) mmHg PCWP (mean): 34 mmHg  Ao sat: 91% PA sat: 30%  Fick CO: 3.4 L/min Fick CI: 2.0 L/min/m^2  PVR: 3.5 Wood units PAPi: 1.2    Patient Profile     67 y.o. female with a past medical history of current tobacco use, COPD, paroxysmal atrial fibrillation/atrial flutter, moderate MR, chronic kidney disease stage IV, cardiomyopathy with an ejection fraction of 30-35% in the setting of  atrial fibrillation/flutter on apixaban who presents to the hospital via the cardiology clinic for worsening shortness of breath and anemia.  Assessment & Plan    Acute on chronic combined systolic and diastolic congestive heart failure -severely reduced function on echo with LVEF 25-30%, continues to drop -recent stress testing revealed a fixed defect without  significant ischemia -RHC on 11/15/22 with RA 21, PA 63/38, PCWP 34, C.O. 3.4, C.I 2.0 -She was treated with milrinone which was discontinued yesterday and she continues to improve. She appears to be euvolemic on torsemide 40 mg once daily and Farxiga 10 mg daily. I increased the dose of carvedilol to 6.25 mg twice daily. No ACE inhibitor, ARB or MRA due to acute on chronic kidney disease. For now, she was started on BiDil yesterday which will be continued.  If renal function improves, this can be switched to [facility]. Given progressive drop in her ejection fraction, we should consider left heart catheterization with low contrast at some point during this hospitalization.  Hopefully this can be done once her GFR is above 30. I wrote orders to transfer the patient to telemetry.  Acute on chronic anemia -most recent hemoglobin of 8.2. -remains stable -no active signs of bleeding -iron given 11/13/22 -has received blood this admission   Paroxysmal atrial fibrillation/flutter: A-fib with RVR was likely the culprit for decompensated heart failure.  She converted to sinus rhythm with IV amiodarone.  She is now on oral amiodarone 400 mg twice daily which should be continued for another 4 days then decrease to 200 mg twice daily for a week then 200 mg once daily after. -continued on apixaban 2.5 mg twice daily for stroke prophylaxis with reduced dosing for weight and kidney function  Chronic kidney disease stage IV -Followed by nephrology.  Renal function is gradually improving.  Long history of tobacco use with COPD -She reports that she quit smoking last month. -continued on oxygen therapy    CHA2DS2-VASc Score = 5   This indicates a 7.2% annual risk of stroke. The patient's score is based upon: CHF History: 1 HTN History: 1 Diabetes History: 0 Stroke History: 0 Vascular Disease History: 1 Age Score: 1 Gender Score: 1  Total encounter time more than 50 minutes. Greater than 50% was  spent in counseling and coordination of care with the patient.   For questions or updates, please contact [place] Please consult [email or web address] for contact info under        Signed, Kathlyn Sacramento, MD  11/20/2022, 8:23 AM

## 2022-11-20 NOTE — Progress Notes (Signed)
Patient alert and oriented. No complaints of pain or shortness of breath. Patient is on 3 liters oxygen. Tolerating diet and using bedside commode. Orders to remove patients Central line. Patient being tx to floor. Continue to assess.

## 2022-11-20 NOTE — Consult Note (Addendum)
Dawn Sherman , MD [address] [place], [place], [place], [address] [address], [place], [place], [place], [address] Phone: [phone]  Fax: [phone]  Consultation  Referring Provider:     Dr Si Raider Primary Care Physician:  Dawn Pheasant, MD Primary Gastroenterologist:  None          Reason for Consultation:     Iron deficiency anemia  Date of Admission:  11/12/2022 Date of Consultation:  11/20/2022         HPI:   Dawn Sherman is a 67 y.o. female with a history of COPD paroxysmal atrial fibrillation stage IV CKD cardiomyopathy on apixaban presents to the hospital with chocolates of breath and anemia.  He did on 11/12/2022 for hypoxemic respiratory failure, CHF atrial fibrillation.  On admission hemoglobin was 7.1 g with an MCV of 82.4.  1 month back hemoglobin of 9.7 g.  Iron studies show a ferritin of 18 which has been low for over 6 years.  Folate was normal B12 normal.  Chest x-ray on 11/15/2022 shows moderate right pleural effusion increased compared to recent exam.  Ejection fraction is very low at 25 to 30% IV iron given on 11/13/2022.  Review of epic shows that last colonoscopy was back in 2015 which was normal  She denies any blood in her stool, urine, did have some nose bleeds 1 week back but that was on a single occasion. No recent EGD or colonoscopy   Past Medical History:  Diagnosis Date   B12 deficiency    Cardiomyopathy ([place])    a. 09/2022 Echo: EF 35-40%; b. 09/2022 MV: apical defect w/ mild peri-infarct ischemia. EF 43%.   Chronic HFrEF (heart failure with reduced ejection fraction) ([place])    a. 01/2020 Echo: EF 30-35%; b. 07/2020 Echo: EF 50-55%; c. 06/2022 Echo: EF 50-55%; d. 09/2022 Echo: EF 35-40%, globh HK, mod reduced RV fxn, sev BAE, mod MR.   CKD (chronic kidney disease), stage IV (H[facility])    COPD (chronic obstructive pulmonary disease) (HCC)    Depression    Endometriosis    Hypertension    Mixed hyperlipidemia    Moderate mitral regurgitation     Normocytic anemia    PAF (paroxysmal atrial fibrillation) (HCC)    a. CHA2DS2VASc = 4-->eliquis 2.5 bid.   Paroxymsal Atrial flutter ([place])    Tobacco abuse     Past Surgical History:  Procedure Laterality Date   ABDOMINAL HYSTERECTOMY     BREAST BIOPSY Right 2015   neg-  FIBROADENOMA   CENTRAL LINE INSERTION  11/15/2022   Procedure: CENTRAL LINE INSERTION;  Surgeon: Dawn Bush, MD;  Location: [place] [place] [facility];  Service: Cardiovascular;;   RIGHT HEART CATH N/A 11/15/2022   Procedure: RIGHT HEART CATH;  Surgeon: Dawn Bush, MD;  Location: [place] [facility];  Service: Cardiovascular;  Laterality: N/A;   TAH/RSO  1999   secondary to bleeding and endometriosis (Dr Dawn Sherman)    Prior to Admission medications   Medication Sig Start Date End Date Taking? Authorizing Provider  acetaminophen (TYLENOL) 650 MG CR tablet Take 650 mg by mouth every 8 (eight) hours as needed for pain or fever.   Yes [provider]  albuterol (VENTOLIN HFA) 108 (90 Base) MCG/ACT inhaler Inhale 2 puffs into the lungs every 6 (six) hours as needed for wheezing or shortness of breath. 09/18/22  Yes Dawn Pheasant, MD  amiodarone (PACERONE) 200 MG tablet Take 1 tablet (200 mg total) by mouth daily. 09/12/22  Yes Duard Brady, MD  apixaban (ELIQUIS) 2.5 MG TABS tablet Take 1 tablet (2.5 mg total) by mouth 2 (two) times daily. 04/08/22  Yes Minna Merritts, MD  atorvastatin (LIPITOR) 40 MG tablet Take 1 tablet (40 mg total) by mouth daily. 10/09/22  Yes Dawn Pheasant, MD  buPROPion (WELLBUTRIN XL) 150 MG 24 hr tablet Take 1 tablet (150 mg total) by mouth daily. 10/09/22  Yes Dawn Pheasant, MD  calcium carbonate (OS-CAL - DOSED IN MG OF ELEMENTAL CALCIUM) 1250 (500 Ca) MG tablet Take 1 tablet (500 mg of elemental calcium total) by mouth 3 (three) times daily with meals. 12/14/21  Yes Fritzi Mandes, MD  FARXIGA 10 MG TABS tablet Take 10 mg by mouth daily. 11/20/21  Yes [provider]   Fluticasone-Umeclidin-Vilant (TRELEGY ELLIPTA) 100-62.5-25 MCG/ACT AEPB Inhale 1 puff into the lungs daily. 03/05/22  Yes Dawn Pheasant, MD  loratadine (CLARITIN) 10 MG tablet Take 10 mg by mouth daily.   Yes [provider]  metoprolol succinate (TOPROL XL) 25 MG 24 hr tablet Take 1 tablet (25 mg total) by mouth daily. 11/12/22  Yes Furth, Cadence H, PA-C  omeprazole (PRILOSEC) 20 MG capsule Take 20 mg by mouth daily.   Yes [provider]  torsemide (DEMADEX) 20 MG tablet Take 2 tablets (40 mg) by mouth once daily. You may take an extra 2 tablets (40 mg) in the afternoon as needed for leg swelling 10/08/22  Yes Minna Merritts, MD  venlafaxine XR (EFFEXOR-XR) 150 MG 24 hr capsule TAKE ONE CAPSULE BY MOUTH EVERY DAY 10/09/22  Yes Dawn Pheasant, MD    Family History  Problem Relation Age of Onset   Hypercholesterolemia Mother    Rheum arthritis Father    Rheum arthritis Daughter    Fibromyalgia Daughter    Breast cancer Neg Hx    Colon cancer Neg Hx      Social History   Tobacco Use   Smoking status: Former    Packs/day: 0.25    Years: 40.00    Additional pack years: 0.00    Total pack years: 10.00    Types: Cigarettes   Smokeless tobacco: Never   Tobacco comments:    Patient quit smoking recently   Vaping Use   Vaping Use: Never used  Substance Use Topics   Alcohol use: Not Currently    Alcohol/week: 0.0 standard drinks of alcohol    Comment: occasional   Drug use: No    Allergies as of 11/12/2022 - Review Complete 11/12/2022  Allergen Reaction Noted   Sulfate Rash 07/02/2012   Codeine sulfate Nausea Only 07/02/2012   Benicar [olmesartan]  07/02/2012   Amoxicillin Rash 07/02/2012   Clindamycin/lincomycin Rash 10/15/2012   Entresto [sacubitril-valsartan] Other (See Comments) 09/08/2022   Morphine and related Rash 07/02/2012   Penicillins Rash 07/02/2012    Review of Systems:    All systems reviewed and negative except where noted in HPI.    Physical Exam:  Vital signs in last 24 hours: Temp:  [97.5 F (36.4 C)-98.6 F (37 C)] 97.5 F (36.4 C) (03/27 1313) Pulse Rate:  [67-81] 81 (03/27 1313) Resp:  [11-26] 16 (03/27 1313) BP: (103-140)/(47-83) 134/63 (03/27 1313) SpO2:  [95 %-100 %] 99 % (03/27 1313) Weight:  [57.2 kg] 57.2 kg (03/27 0500) Last BM Date : 11/19/22 General:   Pleasant, cooperative in NAD Head:  Normocephalic and atraumatic. Eyes:   No icterus.   Conjunctiva pink. PERRLA. Ears:  Normal auditory acuity. Neck:  Supple; no masses or thyroidomegaly Lungs: Respirations even and unlabored. Lungs clear to auscultation bilaterally.   No wheezes, crackles, or rhonchi.  Heart:  Regular rate and rhythm;  Without murmur, clicks, rubs or gallops Abdomen:  Soft, nondistended, nontender. Normal bowel sounds. No appreciable masses or hepatomegaly.  No rebound or guarding.  Neurologic:  Alert and oriented x3;  grossly normal neurologically. Skin:  Ipetichia on her arms  Cervical Nodes:  No significant cervical adenopathy. Psych:  Alert and cooperative. Normal affect.  LAB RESULTS: No results for input(s): "WBC", "HGB", "HCT", "PLT" in the last 72 hours. BMET Recent Labs    11/18/22 0429 11/19/22 0452 11/20/22 0613  NA 133* 132* 135  K 4.3 4.2 4.7  CL 94* 98 100  CO2 27 26 27   GLUCOSE 91 160* 109*  BUN 86* 76* 73*  CREATININE 3.19* 2.91* 2.82*  CALCIUM 8.8* 8.5* 8.4*   LFT No results for input(s): "PROT", "ALBUMIN", "AST", "ALT", "ALKPHOS", "BILITOT", "BILIDIR", "IBILI" in the last 72 hours. PT/INR No results for input(s): "LABPROT", "INR" in the last 72 hours.  STUDIES: No results found.    Impression / Plan:   CHARLETTE WARMUTH is a 67 y.o. y/o female with a history of paroxysmal atrial fibrillation heart failure admitted with hypoxemic respiratory failure atrial fibrillation and found to have severe iron deficiency anemia.  I have been consulted to evaluate for the iron deficiency anemia.  A few days back  the patient had a moderate right pleural effusion.  Differentials for iron deficiency anemia include AVMs in view of chronic kidney disease and neoplasm .    The patient would definitely require an EGD and colonoscopy and the main decision to be made as to timing of these procedures, they are not urgent and should be done when she is optimized from the cardiopulmonary status point of view.  If it is not possible to be done during hospitalization , then can be done as an outpatient would recommend to continue IV iron till then.  Would recommend to start with an x-ray to check the status of the pleural effusion and if negative then would require cardiac input to see if it is safe from the anesthesia point of view at this point of time.If it is then eloquis should be held for 2 days before EGD+ colonoscopy.  I will also check celiac serology   Thank you for involving me in the care of this patient.      LOS: 8 days   Dawn Bellows, MD  11/20/2022, 1:37 PM

## 2022-11-20 NOTE — Progress Notes (Signed)
[facility]  ROUNDING NOTE   Subjective:   [name] was admitted to [facility] on 11/12/2022 for Hypoxia [R09.02] Symptomatic anemia [D64.9] Anemia, unspecified type [D64.9] Dyspnea, unspecified type [R06.00]  Patient was last seen by nephrology on 11/20/2021 for her chronic kidney disease by Dr. [name].   Patient sitting up in bed Alert and oriented Remains on room air Tolerating meals without nausea and vomiting   Objective:  Vital signs in last 24 hours:  Temp:  [97.5 F (36.4 C)-98.6 F (37 C)] 98 F (36.7 C) (03/27 0700) Pulse Rate:  [67-83] 79 (03/27 0900) Resp:  [11-26] 16 (03/27 0900) BP: (103-158)/(47-117) 129/62 (03/27 0900) SpO2:  [95 %-100 %] 99 % (03/27 0900) Weight:  [57.2 kg] 57.2 kg (03/27 0500)  Weight change: 0 kg Filed Weights   11/18/22 0409 11/19/22 0500 11/20/22 0500  Weight: 55.1 kg 57.2 kg 57.2 kg    Intake/Output: I/O last 3 completed shifts: In: 1192.2 [P.O.:840; I.V.:352.2] Out: 3400 [Urine:3400]   Intake/Output this shift:  No intake/output data recorded.  Physical Exam: General: NAD  Head: Normocephalic, atraumatic. Moist oral mucosal membranes  Eyes: Anicteric  Lungs:  Clear to auscultation, normal effort  Heart: Regular  Abdomen:  Soft, nontender, nondistended  Extremities: No peripheral edema.  Neurologic: Nonfocal, moving all four extremities  Skin: No lesions    Basic Metabolic Panel: Recent Labs  Lab 11/14/22 0447 11/14/22 1125 11/16/22 0326 11/17/22 0421 11/18/22 0429 11/19/22 0452 11/20/22 0613  NA  --    < > 136 133* 133* 132* 135  K  --    < > 4.7 4.1 4.3 4.2 4.7  CL  --    < > 103 96* 94* 98 100  CO2  --    < > 26 27 27 26 27   GLUCOSE  --    < > 92 103* 91 160* 109*  BUN  --    < > 64* 70* 86* 76* 73*  CREATININE  --    < > 2.91* 3.23* 3.19* 2.91* 2.82*  CALCIUM  --    < > 9.1 8.9 8.8* 8.5* 8.4*  MG 2.6*  --  2.5* 2.3 2.2 2.2 2.1  PHOS 4.1  --   --   --   --   --   --    < > = values in  this interval not displayed.     Liver Function Tests: Recent Labs  Lab 11/14/22 0447  AST 24  ALT 20  ALKPHOS 83  BILITOT 1.0  PROT 6.1*  ALBUMIN 3.3*    No results for input(s): "LIPASE", "AMYLASE" in the last 168 hours. No results for input(s): "AMMONIA" in the last 168 hours.  CBC: Recent Labs  Lab 11/14/22 1125 11/15/22 0425 11/15/22 1231 11/15/22 1235  WBC 8.3 9.0  --   --   HGB 7.9* 8.1* 8.2* 8.2*  HCT 26.1* 28.3* 24.0* 24.0*  MCV 82.3 85.5  --   --   PLT 282 270  --   --      Cardiac Enzymes: No results for input(s): "CKTOTAL", "CKMB", "CKMBINDEX", "TROPONINI" in the last 168 hours.  BNP: Invalid input(s): "POCBNP"  CBG: Recent Labs  Lab 11/15/22 0841 11/15/22 1417  GLUCAP 87 44     Microbiology: Results for orders placed or performed during the hospital encounter of 11/12/22  MRSA Next Gen by PCR, Nasal     Status: None   Collection Time: 11/15/22  2:17 PM  Specimen: Nasal Mucosa; Nasal Swab  Result Value Ref Range Status   MRSA by PCR Next Gen NOT DETECTED NOT DETECTED Final    Comment: (NOTE) The GeneXpert MRSA Assay (FDA approved for NASAL specimens only), is one component of a comprehensive MRSA colonization surveillance program. It is not intended to diagnose MRSA infection nor to guide or monitor treatment for MRSA infections. Test performance is not FDA approved in patients less than 26 years old. Performed at [facility], [address] [place], [place] [address]     Coagulation Studies: No results for input(s): "LABPROT", "INR" in the last 72 hours.  Urinalysis: No results for input(s): "COLORURINE", "LABSPEC", "PHURINE", "GLUCOSEU", "HGBUR", "BILIRUBINUR", "KETONESUR", "PROTEINUR", "UROBILINOGEN", "NITRITE", "LEUKOCYTESUR" in the last 72 hours.  Invalid input(s): "APPERANCEUR"     Imaging: No results found.   Medications:    sodium chloride      amiodarone  400 mg Oral BID   apixaban  2.5 mg Oral BID    atorvastatin  40 mg Oral Daily   buPROPion  150 mg Oral Daily   calcium carbonate  1 tablet Oral TID WC   carvedilol  6.25 mg Oral BID WC   Chlorhexidine Gluconate Cloth  6 each Topical Daily   Chlorhexidine Gluconate Cloth  6 each Topical Q0600   dapagliflozin propanediol  10 mg Oral Daily   fluticasone furoate-vilanterol  1 puff Inhalation Daily   And   umeclidinium bromide  1 puff Inhalation Daily   isosorbide-hydrALAZINE  1 tablet Oral TID   loratadine  10 mg Oral Daily   pantoprazole  40 mg Oral Daily   sodium chloride flush  10-40 mL Intracatheter Q12H   sodium chloride flush  3 mL Intravenous Q12H   sodium chloride flush  3 mL Intravenous Q12H   torsemide  40 mg Oral Daily   venlafaxine XR  150 mg Oral Q breakfast   sodium chloride, albuterol, clonazepam, senna-docusate, sodium chloride flush, sodium chloride flush  Assessment/ Plan:  [name] is a 67 y.o.  female with hypertension, atrial fibrillation, chronic congestive heart failure, atrophic right kidney, endometriosis, hyperlipidemia, tobacco abuse, GERD, and depression who is admitted to [facility] on 11/12/2022 for Hypoxia [R09.02] Symptomatic anemia [D64.9] Anemia, unspecified type [D64.9] Dyspnea, unspecified type [R06.00]  AKI on Chronic kidney disease stage IV: with hyperkalemia and proteinuria. Baseline creatinine appears to be 2.49 in January. Renal function has progressively decreased in the last few months.  - hold home dapagliflozin-can be considered as outpatient -Creatinine stable today, UOP 2.25L  Continue Farxiga 10 mg daily. - Avoid nephrotoxic agents  - Cardiology recommending LHC, will ensure stable creatinine prior to clearing patient for this.   Hypertension and acute exacerbation of systolic congestive heart failure: on torsemide as outpatient.  - Appreciate cardiology input.  -Right heart cath on 11/05/2022 shows severely elevated left heart, right heart and pulmonary artery pressure.    -Continue torsemide 40 mg daily.  Anemia with chronic kidney disease and iron deficiency: negative for GI bleed.  -IV iron given on 11/13/22   Secondary Hyperparathyroidism: with hypocalcemia - PTH 92 from 11/13/2021 -Calcium remains within desired range.   LOS: [address] 3/27/202410:11 AM

## 2022-11-20 NOTE — Progress Notes (Signed)
Initial IV team consult was for callback only. Attempted callback, but RN off unit. Sent securechat. Discovered that PIV access was wanted before pulling central line. Advised unit RN to place Difficult IV consult and confirmed [facility] RN would respond as soon as possible.

## 2022-11-20 NOTE — Progress Notes (Signed)
Charge [facility] RN and [employer] RN attempted PIV placement. Unable to establish IV. IV team consult placed. [facility] RN aware that central line needs to be removed once PIV placed.

## 2022-11-21 DIAGNOSIS — N184 Chronic kidney disease, stage 4 (severe): Secondary | ICD-10-CM | POA: Diagnosis not present

## 2022-11-21 DIAGNOSIS — D649 Anemia, unspecified: Secondary | ICD-10-CM | POA: Diagnosis not present

## 2022-11-21 DIAGNOSIS — I5043 Acute on chronic combined systolic (congestive) and diastolic (congestive) heart failure: Secondary | ICD-10-CM | POA: Diagnosis not present

## 2022-11-21 DIAGNOSIS — I480 Paroxysmal atrial fibrillation: Secondary | ICD-10-CM | POA: Diagnosis not present

## 2022-11-21 LAB — BASIC METABOLIC PANEL
Anion gap: 11 (ref 5–15)
BUN: 64 mg/dL — ABNORMAL HIGH (ref 8–23)
CO2: 25 mmol/L (ref 22–32)
Calcium: 8.7 mg/dL — ABNORMAL LOW (ref 8.9–10.3)
Chloride: 98 mmol/L (ref 98–111)
Creatinine, Ser: 2.66 mg/dL — ABNORMAL HIGH (ref 0.44–1.00)
GFR, Estimated: 19 mL/min — ABNORMAL LOW (ref >60)
Glucose, Bld: 207 mg/dL — ABNORMAL HIGH (ref 70–99)
Potassium: 4.5 mmol/L (ref 3.5–5.1)
Sodium: 134 mmol/L — ABNORMAL LOW (ref 135–145)

## 2022-11-21 LAB — PROTIME-INR
INR: 1.2 (ref 0.8–1.2)
Prothrombin Time: 14.8 seconds (ref 11.4–15.2)

## 2022-11-21 LAB — APTT: aPTT: 36 seconds (ref 24–36)

## 2022-11-21 MED ORDER — HEPARIN (PORCINE) 25000 UT/250ML-% IV SOLN
1100.0000 [IU]/h | INTRAVENOUS | Status: DC
  Administered 2022-11-21: 800 [IU]/h via INTRAVENOUS
  Administered 2022-11-22: 1000 [IU]/h via INTRAVENOUS
  Filled 2022-11-21 (×2): qty 250

## 2022-11-21 NOTE — Progress Notes (Signed)
[facility]  ROUNDING NOTE   Subjective:   [name] was admitted to [facility] on 11/12/2022 for Hypoxia [R09.02] Symptomatic anemia [D64.9] Anemia, unspecified type [D64.9] Dyspnea, unspecified type [R06.00]  Patient was last seen by nephrology on 11/20/2021 for her chronic kidney disease by Dr. [name].   Patient seen sitting up In bed Alert and oriented Denies pain  States she was able to ambulate in the hall with out supplemental oxygen without shortness of breath.    Objective:  Vital signs in last 24 hours:  Temp:  [97.5 F (36.4 C)-98.5 F (36.9 C)] 97.7 F (36.5 C) (03/28 1139) Pulse Rate:  [65-83] 65 (03/28 1139) Resp:  [16-18] 17 (03/28 1139) BP: (111-140)/(63-83) 111/64 (03/28 1139) SpO2:  [95 %-99 %] 97 % (03/28 1139)  Weight change:  Filed Weights   11/18/22 0409 11/19/22 0500 11/20/22 0500  Weight: 55.1 kg 57.2 kg 57.2 kg    Intake/Output: I/O last 3 completed shifts: In: 480 [P.O.:480] Out: 2000 [Urine:2000]   Intake/Output this shift:  Total I/O In: 440 [P.O.:440] Out: -   Physical Exam: General: NAD  Head: Normocephalic, atraumatic. Moist oral mucosal membranes  Eyes: Anicteric  Lungs:  Clear to auscultation, normal effort  Heart: Regular  Abdomen:  Soft, nontender, nondistended  Extremities: No peripheral edema.  Neurologic: Nonfocal, moving all four extremities  Skin: No lesions    Basic Metabolic Panel: Recent Labs  Lab 11/16/22 0326 11/17/22 0421 11/18/22 0429 11/19/22 0452 11/20/22 0613  NA 136 133* 133* 132* 135  K 4.7 4.1 4.3 4.2 4.7  CL 103 96* 94* 98 100  CO2 26 27 27 26 27   GLUCOSE 92 103* 91 160* 109*  BUN 64* 70* 86* 76* 73*  CREATININE 2.91* 3.23* 3.19* 2.91* 2.82*  CALCIUM 9.1 8.9 8.8* 8.5* 8.4*  MG 2.5* 2.3 2.2 2.2 2.1     Liver Function Tests: No results for input(s): "AST", "ALT", "ALKPHOS", "BILITOT", "PROT", "ALBUMIN" in the last 168 hours.  No results for input(s): "LIPASE", "AMYLASE" in  the last 168 hours. No results for input(s): "AMMONIA" in the last 168 hours.  CBC: Recent Labs  Lab 11/15/22 0425 11/15/22 1231 11/15/22 1235  WBC 9.0  --   --   HGB 8.1* 8.2* 8.2*  HCT 28.3* 24.0* 24.0*  MCV 85.5  --   --   PLT 270  --   --      Cardiac Enzymes: No results for input(s): "CKTOTAL", "CKMB", "CKMBINDEX", "TROPONINI" in the last 168 hours.  BNP: Invalid input(s): "POCBNP"  CBG: Recent Labs  Lab 11/15/22 0841 11/15/22 1417  GLUCAP 87 43     Microbiology: Results for orders placed or performed during the hospital encounter of 11/12/22  MRSA Next Gen by PCR, Nasal     Status: None   Collection Time: 11/15/22  2:17 PM   Specimen: Nasal Mucosa; Nasal Swab  Result Value Ref Range Status   MRSA by PCR Next Gen NOT DETECTED NOT DETECTED Final    Comment: (NOTE) The GeneXpert MRSA Assay (FDA approved for NASAL specimens only), is one component of a comprehensive MRSA colonization surveillance program. It is not intended to diagnose MRSA infection nor to guide or monitor treatment for MRSA infections. Test performance is not FDA approved in patients less than 24 years old. Performed at [facility], [address], [place], [place] [address]     Coagulation Studies: Recent Labs    11/21/22 1118  LABPROT 14.8  INR  1.2    Urinalysis: No results for input(s): "COLORURINE", "LABSPEC", "PHURINE", "GLUCOSEU", "HGBUR", "BILIRUBINUR", "KETONESUR", "PROTEINUR", "UROBILINOGEN", "NITRITE", "LEUKOCYTESUR" in the last 72 hours.  Invalid input(s): "APPERANCEUR"     Imaging: DG Chest 2 View  Result Date: 11/20/2022 CLINICAL DATA:  Evaluate pleural effusions. EXAM: CHEST - 2 VIEW COMPARISON:  11/15/2022 FINDINGS: There is a right IJ catheter with tip in the projection of the cavoatrial junction. Stable cardiomediastinal contours. Interval decreased volume of right pleural effusion with improved aeration to the right lung base. Trace septal markings  identified within the lower lung zones compatible with mild edema. No airspace disease. IMPRESSION: 1. Interval decreased volume of right pleural effusion with improved aeration to the right lung base. 2. Mild edema. Electronically Signed   By: Kerby Moors M.D.   On: 11/20/2022 15:03     Medications:    sodium chloride     heparin      amiodarone  400 mg Oral BID   atorvastatin  40 mg Oral Daily   buPROPion  150 mg Oral Daily   calcium carbonate  1 tablet Oral TID WC   carvedilol  6.25 mg Oral BID WC   dapagliflozin propanediol  10 mg Oral Daily   fluticasone furoate-vilanterol  1 puff Inhalation Daily   And   umeclidinium bromide  1 puff Inhalation Daily   isosorbide-hydrALAZINE  1 tablet Oral TID   loratadine  10 mg Oral Daily   pantoprazole  40 mg Oral Daily   sodium chloride flush  10-40 mL Intracatheter Q12H   sodium chloride flush  3 mL Intravenous Q12H   sodium chloride flush  3 mL Intravenous Q12H   torsemide  40 mg Oral Daily   venlafaxine XR  150 mg Oral Q breakfast   sodium chloride, albuterol, clonazepam, senna-docusate, sodium chloride flush, sodium chloride flush  Assessment/ Plan:  [name] is a 67 y.o.  female with hypertension, atrial fibrillation, chronic congestive heart failure, atrophic right kidney, endometriosis, hyperlipidemia, tobacco abuse, GERD, and depression who is admitted to [facility] on 11/12/2022 for Hypoxia [R09.02] Symptomatic anemia [D64.9] Anemia, unspecified type [D64.9] Dyspnea, unspecified type [R06.00]  AKI on Chronic kidney disease stage IV: with hyperkalemia and proteinuria. Baseline creatinine appears to be 2.49 in January. Renal function has progressively decreased in the last few months.  - hold home dapagliflozin-can be considered as outpatient - Renal function stable Continue Farxiga 10 mg daily. - Avoid nephrotoxic agents  - Renal function would allow for LHC with close monitoring.   Hypertension and acute exacerbation of  systolic congestive heart failure: on torsemide as outpatient.  - Appreciate cardiology input.  -Right heart cath on 11/05/2022 shows severely elevated left heart, right heart and pulmonary artery pressure.   -Continue torsemide 40 mg daily.  Anemia with chronic kidney disease and iron deficiency: negative for GI bleed.  -IV iron given on 11/13/22  Secondary Hyperparathyroidism: with hypocalcemia - PTH 92 from 11/13/2021 -Calcium at goal   LOS: 9 [place] 3/28/202411:57 AM

## 2022-11-21 NOTE — Progress Notes (Addendum)
Rounding Note    Patient Name: Dawn Sherman Date of Encounter: 11/21/2022  [place] Cardiologist: Ida Rogue, MD   Subjective   CXR yesterday with improved right pleural effusion. Dyspnea much improved. No chest pain. BMP pending this morning. Documented UOP 5.3 L for the admission. Supplemental oxygen weaned to 1 L.   Inpatient Medications    Scheduled Meds:  amiodarone  400 mg Oral BID   apixaban  2.5 mg Oral BID   atorvastatin  40 mg Oral Daily   buPROPion  150 mg Oral Daily   calcium carbonate  1 tablet Oral TID WC   carvedilol  6.25 mg Oral BID WC   dapagliflozin propanediol  10 mg Oral Daily   fluticasone furoate-vilanterol  1 puff Inhalation Daily   And   umeclidinium bromide  1 puff Inhalation Daily   isosorbide-hydrALAZINE  1 tablet Oral TID   loratadine  10 mg Oral Daily   pantoprazole  40 mg Oral Daily   sodium chloride flush  10-40 mL Intracatheter Q12H   sodium chloride flush  3 mL Intravenous Q12H   sodium chloride flush  3 mL Intravenous Q12H   torsemide  40 mg Oral Daily   venlafaxine XR  150 mg Oral Q breakfast   Continuous Infusions:  sodium chloride     PRN Meds: sodium chloride, albuterol, clonazepam, senna-docusate, sodium chloride flush, sodium chloride flush   Vital Signs    Vitals:   11/20/22 2041 11/20/22 2348 11/21/22 0308 11/21/22 0744  BP: 114/67 122/72 129/74 (!) 140/83  Pulse: 73 73 76 73  Resp: 18 18 18    Temp: 98.2 F (36.8 C) 97.6 F (36.4 C) 97.7 F (36.5 C) 98.2 F (36.8 C)  TempSrc:    Oral  SpO2: 96% 96% 96% 99%  Weight:      Height:        Intake/Output Summary (Last 24 hours) at 11/21/2022 0932 Last data filed at 11/20/2022 1930 Gross per 24 hour  Intake 480 ml  Output 600 ml  Net -120 ml       11/20/2022    5:00 AM 11/19/2022    5:00 AM 11/18/2022    4:09 AM  Last 3 Weights  Weight (lbs) 126 lb 1.7 oz 126 lb 1.7 oz 121 lb 7.6 oz  Weight (kg) 57.2 kg 57.2 kg 55.1 kg      Telemetry     Sinus with first degree AVB rates 60's-90s bpm - Personally Reviewed  ECG    No new tracings - Personally Reviewed  Physical Exam   GEN: No acute distress.   Neck: No JVD Cardiac: RRR, no murmurs, rubs, or gallops.  Respiratory: Clear to auscultation GI: Soft, nontender, non-distended  MS: No edema; No deformity. Neuro:  Nonfocal  Psych: Normal affect   Labs    High Sensitivity Troponin:   Recent Labs  Lab 11/12/22 1357 11/12/22 1858  TROPONINIHS 10 9      Chemistry Recent Labs  Lab 11/18/22 0429 11/19/22 0452 11/20/22 0613  NA 133* 132* 135  K 4.3 4.2 4.7  CL 94* 98 100  CO2 27 26 27   GLUCOSE 91 160* 109*  BUN 86* 76* 73*  CREATININE 3.19* 2.91* 2.82*  CALCIUM 8.8* 8.5* 8.4*  MG 2.2 2.2 2.1  GFRNONAA 15* 17* 18*  ANIONGAP 12 8 8      Lipids No results for input(s): "CHOL", "TRIG", "HDL", "LABVLDL", "LDLCALC", "CHOLHDL" in the last 168 hours.  Hematology Recent Labs  Lab 11/14/22 1125 11/15/22 0425 11/15/22 1231 11/15/22 1235  WBC 8.3 9.0  --   --   RBC 3.17* 3.31*  --   --   HGB 7.9* 8.1* 8.2* 8.2*  HCT 26.1* 28.3* 24.0* 24.0*  MCV 82.3 85.5  --   --   MCH 24.9* 24.5*  --   --   MCHC 30.3 28.6*  --   --   RDW 17.1* 17.5*  --   --   PLT 282 270  --   --     Thyroid No results for input(s): "TSH", "FREET4" in the last 168 hours.  BNP No results for input(s): "BNP", "PROBNP" in the last 168 hours.   DDimer No results for input(s): "DDIMER" in the last 168 hours.   Radiology    DG Chest 2 View  Result Date: 11/20/2022 IMPRESSION: 1. Interval decreased volume of right pleural effusion with improved aeration to the right lung base. 2. Mild edema. Electronically Signed   By: Kerby Moors M.D.   On: 11/20/2022 15:03    Cardiac Studies  TTE 11/14/22 1. Left ventricular ejection fraction, by estimation, is 25 to 30%. The  left ventricle has severely decreased function. The left ventricle  demonstrates global hypokinesis. There is moderate left  ventricular  hypertrophy. Left ventricular diastolic  parameters are indeterminate.   2. Right ventricular systolic function is moderately reduced. The right  ventricular size is normal. There is moderately elevated pulmonary artery  systolic pressure. The estimated right ventricular systolic pressure is  [id number] mmHg.   3. Left atrial size was severely dilated.   4. A small pericardial effusion is present.   5. The mitral valve is normal in structure. Severe mitral valve  regurgitation. No evidence of mitral stenosis.   6. Tricuspid valve regurgitation is moderate to severe.   7. The aortic valve is normal in structure. Aortic valve regurgitation is  not visualized. No aortic stenosis is present.   8. The inferior vena cava is normal in size with greater than 50%  respiratory variability, suggesting right atrial pressure of 3 mmHg.    [place] 11/15/2022 Right heart catheterization pressures RA (mean): 21 mmHg RV (S/EDP): 62/24 mmHg PA (S/D, mean): 63/38 (46) mmHg PCWP (mean): 34 mmHg  Ao sat: 91% PA sat: 30%  Fick CO: 3.4 L/min Fick CI: 2.0 L/min/m^2  PVR: 3.5 Wood units PAPi: 1.2    Patient Profile     67 y.o. female with a past medical history of current tobacco use, COPD, paroxysmal atrial fibrillation/atrial flutter, moderate MR, chronic kidney disease stage IV, cardiomyopathy with an ejection fraction of 30-35% in the setting of atrial fibrillation/flutter on apixaban who presents to the hospital via the cardiology clinic for worsening shortness of breath and anemia.  Assessment & Plan    Acute on chronic combined systolic and diastolic congestive heart failure -Severely reduced function on echo with LVEF 25-30% -Recent stress testing revealed a fixed defect without significant ischemia -RHC on 11/15/22 with RA 21, PA 63/38, PCWP 34, C.O. 3.4, C.I 2.0 -She was treated with milrinone which was discontinued 3/26, she has continued to improve -She appears to be euvolemic on  torsemide 40 mg once daily and Farxiga 10 mg daily -Continue titrated dose of carvedilol to 6.25 mg twice daily -No ACE inhibitor, ARB, ARNI or MRA due to acute on chronic kidney disease For now, continue newly started BiDil  -If renal function improves, this can be switched to [facility] in the outpatient setting -Given  progressive drop in her ejection fraction, we should consider left heart catheterization with low contrast at some point, hopefully this can be done once her GFR is above 30, timing TBD (has been maintained on Eliquis), will need Eliquis washout  -As outpatient, would look to have her establish with the advanced heart failure team  Acute on chronic anemia -Most recent hemoglobin of 8.1, stable -No active signs of bleeding -Iron given 11/13/22 -Has received blood this admission -GI considering EGD, would need to hold Eliquis and place on heparin gtt  Paroxysmal atrial fibrillation/flutter -A-fib with RVR was likely the culprit for decompensated heart failure -She converted to sinus rhythm with IV amiodarone -She is now on oral amiodarone 400 mg twice daily which should be continued for another 3 days then decrease to 200 mg twice daily for a week then 200 mg once daily thereafter -Remains on apixaban 2.5 mg twice daily for stroke prophylaxis with reduced dosing for weight and kidney function  Chronic kidney disease stage IV -Followed by nephrology -Renal function is gradually improving  Long history of tobacco use with COPD -She reports that she quit smoking last month -Continued on oxygen therapy    CHA2DS2-VASc Score = 5   This indicates a 7.2% annual risk of stroke. The patient's score is based upon: CHF History: 1 HTN History: 1 Diabetes History: 0 Stroke History: 0 Vascular Disease History: 1 Age Score: 1 Gender Score: 1     For questions or updates, please contact [place] Please consult [email or web address] for contact info under         Signed, Christell Faith, PA-C  11/21/2022, 9:32 AM

## 2022-11-21 NOTE — Consult Note (Deleted)
ANTICOAGULATION CONSULT NOTE - Initial Consult  Pharmacy Consult for heparin infusion Indication: atrial fibrillation  Allergies  Allergen Reactions   Sulfate Rash   Codeine Sulfate Nausea Only   Benicar [Olmesartan]     Talked with patient February 10, 2020, intolerance is unclear, tried several medications around that time and one of them gave her a rash but she is not clear which 1.   Amoxicillin Rash   Clindamycin/Lincomycin Rash   Entresto [Sacubitril-Valsartan] Other (See Comments)    hyperkalemia   Morphine And Related Rash   Penicillins Rash    Patient Measurements: Height: 5\' 5"  (165.1 cm) Weight: 57.2 kg (126 lb 1.7 oz) IBW/kg (Calculated) : 57 Heparin Dosing Weight: 57.2 kg  Vital Signs: Temp: 97.7 F (36.5 C) (03/28 1139) Temp Source: Oral (03/28 1139) BP: 111/64 (03/28 1139) Pulse Rate: 65 (03/28 1139)  Labs: Recent Labs    11/19/22 0452 11/20/22 0613 11/21/22 1118  APTT  --   --  36  LABPROT  --   --  14.8  INR  --   --  1.2  CREATININE 2.91* 2.82* 2.66*    Estimated Creatinine Clearance: 18.5 mL/min (A) (by C-G formula based on SCr of 2.66 mg/dL (H)).   Medical History: Past Medical History:  Diagnosis Date   B12 deficiency    Cardiomyopathy ([place])    a. 09/2022 Echo: EF 35-40%; b. 09/2022 MV: apical defect w/ mild peri-infarct ischemia. EF 43%.   Chronic HFrEF (heart failure with reduced ejection fraction) ([place])    a. 01/2020 Echo: EF 30-35%; b. 07/2020 Echo: EF 50-55%; c. 06/2022 Echo: EF 50-55%; d. 09/2022 Echo: EF 35-40%, globh HK, mod reduced RV fxn, sev BAE, mod MR.   CKD (chronic kidney disease), stage IV ([facility])    COPD (chronic obstructive pulmonary disease) (HCC)    Depression    Endometriosis    Hypertension    Mixed hyperlipidemia    Moderate mitral regurgitation    Normocytic anemia    PAF (paroxysmal atrial fibrillation) (H[facility])    a. CHA2DS2VASc = 4-->eliquis 2.5 bid.   Paroxymsal Atrial flutter (HCC)    Tobacco abuse      Medications:  Medications Prior to Admission  Medication Sig Dispense Refill Last Dose   acetaminophen (TYLENOL) 650 MG CR tablet Take 650 mg by mouth every 8 (eight) hours as needed for pain or fever.   Past Week   albuterol (VENTOLIN HFA) 108 (90 Base) MCG/ACT inhaler Inhale 2 puffs into the lungs every 6 (six) hours as needed for wheezing or shortness of breath. 8 g 2 Past Week   amiodarone (PACERONE) 200 MG tablet Take 1 tablet (200 mg total) by mouth daily. 30 tablet 1 11/12/2022   apixaban (ELIQUIS) 2.5 MG TABS tablet Take 1 tablet (2.5 mg total) by mouth 2 (two) times daily. 60 tablet 6 11/12/2022 at 0600   atorvastatin (LIPITOR) 40 MG tablet Take 1 tablet (40 mg total) by mouth daily. 90 tablet 1 11/12/2022   buPROPion (WELLBUTRIN XL) 150 MG 24 hr tablet Take 1 tablet (150 mg total) by mouth daily. 90 tablet 1 11/12/2022   calcium carbonate (OS-CAL - DOSED IN MG OF ELEMENTAL CALCIUM) 1250 (500 Ca) MG tablet Take 1 tablet (500 mg of elemental calcium total) by mouth 3 (three) times daily with meals. 60 tablet 1 11/12/2022   FARXIGA 10 MG TABS tablet Take 10 mg by mouth daily.   11/12/2022   Fluticasone-Umeclidin-Vilant (TRELEGY ELLIPTA) 100-62.5-25 MCG/ACT AEPB Inhale 1 puff into  the lungs daily. 3 each 3 11/12/2022   loratadine (CLARITIN) 10 MG tablet Take 10 mg by mouth daily.   11/12/2022   metoprolol succinate (TOPROL XL) 25 MG 24 hr tablet Take 1 tablet (25 mg total) by mouth daily. 30 tablet 1 11/12/2022   omeprazole (PRILOSEC) 20 MG capsule Take 20 mg by mouth daily.   11/12/2022   torsemide (DEMADEX) 20 MG tablet Take 2 tablets (40 mg) by mouth once daily. You may take an extra 2 tablets (40 mg) in the afternoon as needed for leg swelling 270 tablet 3 11/12/2022   venlafaxine XR (EFFEXOR-XR) 150 MG 24 hr capsule TAKE ONE CAPSULE BY MOUTH EVERY DAY 90 capsule 1 11/12/2022   Scheduled:   amiodarone  400 mg Oral BID   atorvastatin  40 mg Oral Daily   buPROPion  150 mg Oral Daily   calcium  carbonate  1 tablet Oral TID WC   carvedilol  6.25 mg Oral BID WC   dapagliflozin propanediol  10 mg Oral Daily   fluticasone furoate-vilanterol  1 puff Inhalation Daily   And   umeclidinium bromide  1 puff Inhalation Daily   isosorbide-hydrALAZINE  1 tablet Oral TID   loratadine  10 mg Oral Daily   pantoprazole  40 mg Oral Daily   sodium chloride flush  10-40 mL Intracatheter Q12H   sodium chloride flush  3 mL Intravenous Q12H   sodium chloride flush  3 mL Intravenous Q12H   torsemide  40 mg Oral Daily   venlafaxine XR  150 mg Oral Q breakfast   Infusions:   sodium chloride     heparin     PRN: sodium chloride, albuterol, clonazepam, senna-docusate, sodium chloride flush, sodium chloride flush Anti-infectives (From admission, onward)    None        Assessment: 18 YOF admitted for CHF presenting to ED with CC of symptomatic anemia. PMH includes HFpEF, paroxysmal Afib, subarachnoid hemorrhage secondary to mechanical fall, CKD stage 4, HLD, GERD, HTN, COPD. Patient takes apixaban 2.5 mg BID for Afib. Per provider, the plan is to stop apixaban and start heparin infusion. The last dose of apixaban was 3/28 at 0914, so we will need to hold heparin infusion until 2114. Pharmacy is being consulted for initiation of heparin infusion  Goal of Therapy:  Heparin level 0.3-0.7 units/ml Monitor platelets by anticoagulation protocol: Yes   Plan:  No initial bolus per provider.  Start heparin infusion at 800 units/hr Check anti-Xa/aPTT level in 8 hours and daily while on heparin Continue to monitor H&H and platelets  [employer] 11/21/2022,12:16 PM

## 2022-11-21 NOTE — Progress Notes (Signed)
Progress Note   Patient: Dawn Sherman [id number] DOB: 22-Mar-1956 DOA: 11/12/2022     9 DOS: the patient was seen and examined on 11/21/2022   Brief hospital course: [name] is a 67 year old female with history of heart failure preserved ejection fraction, paroxysmal atrial fibrillation on low-dose Eliquis, history of subarachnoid hemorrhage secondary to mechanical fall, CKD stage IV, COPD, hypertension, who presents to emergency department from cardiology outpatient clinic for chief concerns of symptomatic anemia.   Vitals in the ED showed temperature of 97.8, respiration rate 18, heart rate 66, blood pressure 116/66, SpO2 of 97% on room air.   Serum sodium is 134, potassium 5.2, chloride 99, bicarb 25, BUN 40, serum creatinine of 2.71, nonfasting blood glucose 131, WBC 8.1, hemoglobin 7.7, platelets of 308.  eGFR 19.  BNP elevated at 3247.   High sensitive troponin has been ordered and is in process.   ED treatment: None   3/20: Vital stable on 4 L of oxygen this morning.  Apparently patient developed shortness of breath with flushing and muscle aches while getting blood transfusion which was discontinued at that time UA and transfusion reaction labs were negative.  Chest x-ray with pulmonary vascular congestion s/p IV Lasix and Benadryl.  CBC this morning with hemoglobin of 8.  Anemia panel with iron deficiency.  IV iron supplement ordered. BMP with slight worsening of creatinine to 2.96.  And potassium of 5.3.  Nephrology was also consulted.   3/21: Vitals and labs seems stable.  Remained on 3 L of oxygen.  Cardiology was also consulted and patient will be going for right heart catheterization tomorrow.   3/22: Vital stable, renal function continued to get worse slowly, persistent hyperkalemia with potassium at 5.4 despite getting Veltassa - dose was increased today. Echocardiogram with worsening of EF and right heart catheterization today with significantly elevated pressures.   Patient was started on milrinone and Lasix infusion by cardiology.   3/23: Vital stable, remained on 3 L of oxygen.  Hemoglobin improved to 10, slight worsening of renal function with creatinine at 2.91, UOP recorded 2350, patient remains on milrinone and Lasix infusion.   3/24: Hemodynamically stable.  Worsening renal function, UOP recorded of more than 5 L in 24 hours, patient also received metolazone.  CVP or 10.  After discussing with cardiology and nephrology we are holding Lasix infusion today.  A-fib with RVR today heart rate cardiology switched to p.o. amiodarone with IV bolus and infusion.   3/25: Vital stable.  Converted back to sinus rhythm.  Creatinine at 3.19, nephrology decided to continue holding Lasix for another day.  Milrinone dose was decreased today.  Will remain on amiodarone infusion with a plan to convert to p.o. tomorrow.   3/26: Hemodynamically stable.  Renal functions with slow improvement, good UOP.  Nephrology restarted [place].  Remained on amiodarone and milrinone infusions.  Most likely will be converted to p.o. amiodarone today.  3/27: continues to improve. Off milronone. Coreg increased. Cardiology tentatively planning on left heart cath  Assessment and Plan: * Acute on chronic combined systolic and diastolic CHF (congestive heart failure) ([place]) Repeat echo done with worsening of EF, right heart cath  with significantly elevated pressures. Patient was transferred to ICU to start milrinone and Lasix infusion. Now off those -Continue with strict intake and output -Daily BMP and weight -Appreciate advanced heart failure team as they are managing her heart failure now -continue torsemide and farxiga and bi-dil and co-reg - for now plan for  outpatient [facility]  Acute hypoxemic respiratory failure (HCC) Likely multifactorial in setting of symptomatic acute on chronic anemia and small bilateral pleural effusion Procalcitonin negative.  Patient uses 2-3 L of oxygen at  night at home. -Continue supplemental oxygen-wean as tolerated  Symptomatic anemia stable Patient's baseline hemoglobin over the last 2 months has been 9.5-10.6 S/p partial 1 unit of PRBC as she developed worsening shortness of breath while getting transfusion which was discontinued, patient also received IV Lasix overnight.  Anemia panel with some iron deficiency and also consistent with anemia of chronic disease. Last colonoscopy 2015. No hx of gi bleed Denies any obvious bleeding or melena GI following, tentative plan for endoluminal eval on Saturday Will stop apixaban and start heparin  Persistent atrial fibrillation (HCC) Spontaneously converted back to sinus rhythm with amiodarone infusion. Now on po amiodarone. Stopping apixaban and starting heparin today in prep for Saturday endoscopy  Hyperkalemia Resolved  CKD (chronic kidney disease), stage IV ([place]) Patient with slowly improving renal function now, creatinine for today pending, baseline ckd 4 with cr of ~2.5 - nephrology following  HLD (hyperlipidemia) Atorvastatin 40 mg daily resumed  Tobacco use Patient reports that she has quit, her last cigarette was 1.5 months ago  Essential hypertension Bp appropriate on above meds  Dyslipidemia Atorvastatin 40 mg daily resumed  Depression, major, single episode, mild (HCC) Bupropion 150 mg and venlafaxine 150 mg daily resumed  GERD without esophagitis PPI resumed   Subjective: Patient was seen and examined today.  No new complaints. Feeling well. No chest pain or dyspnea. Brown BMs  Physical Exam: Vitals:   11/20/22 2041 11/20/22 2348 11/21/22 0308 11/21/22 0744  BP: 114/67 122/72 129/74 (!) 140/83  Pulse: 73 73 76 73  Resp: 18 18 18    Temp: 98.2 F (36.8 C) 97.6 F (36.4 C) 97.7 F (36.5 C) 98.2 F (36.8 C)  TempSrc:    Oral  SpO2: 96% 96% 96% 99%  Weight:      Height:       General.  Frail lady, in no acute distress. Pulmonary.  Lungs clear bilaterally,  normal respiratory effort. CV.  Regular rate and rhythm, no JVD, rub or murmur. Abdomen.  Soft, nontender, nondistended, BS positive. CNS.  Alert and oriented .  No focal neurologic deficit. Extremities.  No edema, no cyanosis, pulses intact and symmetrical. Psychiatry.  Judgment and insight appears normal.    Data Reviewed: Prior data reviewed  Family Communication: husband updated @ bedside 3/28  Disposition: Status is: Inpatient Remains inpatient appropriate because: Severity of illness  Planned Discharge Destination: Home, no home health needs identified by PT  Time spent: 35 min  Author: Desma Maxim, MD 11/21/2022 10:56 AM  For on call review [email or web address].

## 2022-11-21 NOTE — Consult Note (Addendum)
ANTICOAGULATION CONSULT NOTE - Initial Consult  Pharmacy Consult for heparin infusion Indication: atrial fibrillation  Allergies  Allergen Reactions   Sulfate Rash   Codeine Sulfate Nausea Only   Benicar [Olmesartan]     Talked with patient February 10, 2020, intolerance is unclear, tried several medications around that time and one of them gave her a rash but she is not clear which 1.   Amoxicillin Rash   Clindamycin/Lincomycin Rash   Entresto [Sacubitril-Valsartan] Other (See Comments)    hyperkalemia   Morphine And Related Rash   Penicillins Rash    Patient Measurements: Height: 5\' 5"  (165.1 cm) Weight: 57.2 kg (126 lb 1.7 oz) IBW/kg (Calculated) : 57 Heparin Dosing Weight: 57.2 kg  Vital Signs: Temp: 98.6 F (37 C) (03/28 1500) Temp Source: Oral (03/28 1500) BP: 125/71 (03/28 1500) Pulse Rate: 68 (03/28 1500)  Labs: Recent Labs    11/19/22 0452 11/20/22 0613 11/21/22 1118  APTT  --   --  36  LABPROT  --   --  14.8  INR  --   --  1.2  CREATININE 2.91* 2.82* 2.66*     Estimated Creatinine Clearance: 18.5 mL/min (A) (by C-G formula based on SCr of 2.66 mg/dL (H)).   Medical History: Past Medical History:  Diagnosis Date   B12 deficiency    Cardiomyopathy ([place])    a. 09/2022 Echo: EF 35-40%; b. 09/2022 MV: apical defect w/ mild peri-infarct ischemia. EF 43%.   Chronic HFrEF (heart failure with reduced ejection fraction) ([place])    a. 01/2020 Echo: EF 30-35%; b. 07/2020 Echo: EF 50-55%; c. 06/2022 Echo: EF 50-55%; d. 09/2022 Echo: EF 35-40%, globh HK, mod reduced RV fxn, sev BAE, mod MR.   CKD (chronic kidney disease), stage IV (HCC)    COPD (chronic obstructive pulmonary disease) (HCC)    Depression    Endometriosis    Hypertension    Mixed hyperlipidemia    Moderate mitral regurgitation    Normocytic anemia    PAF (paroxysmal atrial fibrillation) (HCC)    a. CHA2DS2VASc = 4-->eliquis 2.5 bid.   Paroxymsal Atrial flutter (HCC)    Tobacco abuse      Medications:  Medications Prior to Admission  Medication Sig Dispense Refill Last Dose   acetaminophen (TYLENOL) 650 MG CR tablet Take 650 mg by mouth every 8 (eight) hours as needed for pain or fever.   Past Week   albuterol (VENTOLIN HFA) 108 (90 Base) MCG/ACT inhaler Inhale 2 puffs into the lungs every 6 (six) hours as needed for wheezing or shortness of breath. 8 g 2 Past Week   amiodarone (PACERONE) 200 MG tablet Take 1 tablet (200 mg total) by mouth daily. 30 tablet 1 11/12/2022   apixaban (ELIQUIS) 2.5 MG TABS tablet Take 1 tablet (2.5 mg total) by mouth 2 (two) times daily. 60 tablet 6 11/12/2022 at 0600   atorvastatin (LIPITOR) 40 MG tablet Take 1 tablet (40 mg total) by mouth daily. 90 tablet 1 11/12/2022   buPROPion (WELLBUTRIN XL) 150 MG 24 hr tablet Take 1 tablet (150 mg total) by mouth daily. 90 tablet 1 11/12/2022   calcium carbonate (OS-CAL - DOSED IN MG OF ELEMENTAL CALCIUM) 1250 (500 Ca) MG tablet Take 1 tablet (500 mg of elemental calcium total) by mouth 3 (three) times daily with meals. 60 tablet 1 11/12/2022   FARXIGA 10 MG TABS tablet Take 10 mg by mouth daily.   11/12/2022   Fluticasone-Umeclidin-Vilant (TRELEGY ELLIPTA) 100-62.5-25 MCG/ACT AEPB Inhale 1 puff  into the lungs daily. 3 each 3 11/12/2022   loratadine (CLARITIN) 10 MG tablet Take 10 mg by mouth daily.   11/12/2022   metoprolol succinate (TOPROL XL) 25 MG 24 hr tablet Take 1 tablet (25 mg total) by mouth daily. 30 tablet 1 11/12/2022   omeprazole (PRILOSEC) 20 MG capsule Take 20 mg by mouth daily.   11/12/2022   torsemide (DEMADEX) 20 MG tablet Take 2 tablets (40 mg) by mouth once daily. You may take an extra 2 tablets (40 mg) in the afternoon as needed for leg swelling 270 tablet 3 11/12/2022   venlafaxine XR (EFFEXOR-XR) 150 MG 24 hr capsule TAKE ONE CAPSULE BY MOUTH EVERY DAY 90 capsule 1 11/12/2022   Scheduled:   amiodarone  400 mg Oral BID   atorvastatin  40 mg Oral Daily   buPROPion  150 mg Oral Daily   calcium  carbonate  1 tablet Oral TID WC   carvedilol  6.25 mg Oral BID WC   dapagliflozin propanediol  10 mg Oral Daily   fluticasone furoate-vilanterol  1 puff Inhalation Daily   And   umeclidinium bromide  1 puff Inhalation Daily   isosorbide-hydrALAZINE  1 tablet Oral TID   loratadine  10 mg Oral Daily   pantoprazole  40 mg Oral Daily   sodium chloride flush  10-40 mL Intracatheter Q12H   sodium chloride flush  3 mL Intravenous Q12H   sodium chloride flush  3 mL Intravenous Q12H   torsemide  40 mg Oral Daily   venlafaxine XR  150 mg Oral Q breakfast   Infusions:   sodium chloride     heparin     PRN: sodium chloride, albuterol, clonazepam, senna-docusate, sodium chloride flush, sodium chloride flush Anti-infectives (From admission, onward)    None        Assessment: 37 YOF admitted for CHF presenting to ED with CC of symptomatic anemia. PMH includes HFpEF, paroxysmal Afib, subarachnoid hemorrhage secondary to mechanical fall, CKD stage 4, HLD, GERD, HTN, COPD. Patient takes apixaban 2.5 mg BID for Afib. Per provider, the plan is to stop apixaban and start heparin infusion. The last dose of apixaban was 3/28 at 0914. CHADSVASc 4.  Pharmacy is being consulted for initiation of heparin infusion for afib while holding apixaban.   Goal of Therapy:  Heparin level 0.3-0.5 units/ml when heparin level and aPTT correlate.  aPTT 66-85 seconds Monitor platelets by anticoagulation protocol: Yes   Plan:  Do not bolus per MD.  Will start heparin infusion (800 units/hr) at 2100 (~12 hours after the apixaban dose). Check aPTT in 8 hours after heparin infusion is started. Check heparin level and CBC with AM labs. Switch to heparin level monitoring once aPTT and heparin level correlate.    Oswald Hillock, PharmD, BCPS 11/21/2022,4:05 PM

## 2022-11-21 NOTE — Progress Notes (Signed)
Mobility Specialist - Progress Note   11/21/22 1100  Mobility  Activity Ambulated independently in hallway;Stood at bedside;Dangled on edge of bed  Level of Assistance Independent  Assistive Device None  Distance Ambulated (ft) 180 ft  Activity Response Tolerated well  Mobility Referral Yes  $Mobility charge 1 Mobility   Pt EOB on 2L upon arrival. Pt STS and ambulates in hallway indep with no LOB noted. Pt returns to EOB with needs in reach and family in room.   Gretchen Short  Mobility Specialist  11/21/22 11:01 AM

## 2022-11-22 DIAGNOSIS — I5043 Acute on chronic combined systolic (congestive) and diastolic (congestive) heart failure: Secondary | ICD-10-CM | POA: Diagnosis not present

## 2022-11-22 DIAGNOSIS — D649 Anemia, unspecified: Secondary | ICD-10-CM | POA: Diagnosis not present

## 2022-11-22 DIAGNOSIS — R0902 Hypoxemia: Secondary | ICD-10-CM | POA: Diagnosis not present

## 2022-11-22 DIAGNOSIS — R0600 Dyspnea, unspecified: Secondary | ICD-10-CM | POA: Diagnosis not present

## 2022-11-22 DIAGNOSIS — Z720 Tobacco use: Secondary | ICD-10-CM

## 2022-11-22 LAB — CBC
HCT: 29.5 % — ABNORMAL LOW (ref 36.0–46.0)
Hemoglobin: 9.1 g/dL — ABNORMAL LOW (ref 12.0–15.0)
MCH: 26 pg (ref 26.0–34.0)
MCHC: 30.8 g/dL (ref 30.0–36.0)
MCV: 84.3 fL (ref 80.0–100.0)
Platelets: 268 K/uL (ref 150–400)
RBC: 3.5 MIL/uL — ABNORMAL LOW (ref 3.87–5.11)
RDW: 21.8 % — ABNORMAL HIGH (ref 11.5–15.5)
WBC: 8.6 K/uL (ref 4.0–10.5)
nRBC: 0 % (ref 0.0–0.2)

## 2022-11-22 LAB — BASIC METABOLIC PANEL
Anion gap: 8 (ref 5–15)
BUN: 67 mg/dL — ABNORMAL HIGH (ref 8–23)
CO2: 26 mmol/L (ref 22–32)
Calcium: 8.3 mg/dL — ABNORMAL LOW (ref 8.9–10.3)
Chloride: 100 mmol/L (ref 98–111)
Creatinine, Ser: 2.76 mg/dL — ABNORMAL HIGH (ref 0.44–1.00)
GFR, Estimated: 18 mL/min — ABNORMAL LOW (ref >60)
Glucose, Bld: 97 mg/dL (ref 70–99)
Potassium: 4.8 mmol/L (ref 3.5–5.1)
Sodium: 134 mmol/L — ABNORMAL LOW (ref 135–145)

## 2022-11-22 LAB — APTT
aPTT: 35 s (ref 24–36)
aPTT: 33 s (ref 24–36)

## 2022-11-22 LAB — HEPARIN LEVEL (UNFRACTIONATED): Heparin Unfractionated: >1.1 IU/mL — ABNORMAL HIGH (ref 0.30–0.70)

## 2022-11-22 LAB — CELIAC DISEASE PANEL
Endomysial Ab, IgA: NEGATIVE
IgA: 117 mg/dL (ref 87–352)
Tissue Transglutaminase Ab, IgA: <2 U/mL (ref 0–3)

## 2022-11-22 MED ORDER — PEG 3350-KCL-NA BICARB-NACL 420 G PO SOLR
4000.0000 mL | Freq: Once | ORAL | Status: AC
  Administered 2022-11-22: 4000 mL via ORAL
  Filled 2022-11-22: qty 4000

## 2022-11-22 MED ORDER — SODIUM CHLORIDE 0.9 % IV SOLN: INTRAVENOUS | Status: DC

## 2022-11-22 NOTE — Consult Note (Signed)
ANTICOAGULATION CONSULT NOTE - Follow Up Consult  Pharmacy Consult for heparin infusion Indication: atrial fibrillation  Allergies  Allergen Reactions   Sulfate Rash   Codeine Sulfate Nausea Only   Benicar [Olmesartan]     Talked with patient February 10, 2020, intolerance is unclear, tried several medications around that time and one of them gave her a rash but she is not clear which 1.   Amoxicillin Rash   Clindamycin/Lincomycin Rash   Entresto [Sacubitril-Valsartan] Other (See Comments)    hyperkalemia   Morphine And Related Rash   Penicillins Rash    Patient Measurements: Height: 5\' 5"  (165.1 cm) Weight: 54.9 kg (121 lb 0.5 oz) IBW/kg (Calculated) : 57 Heparin Dosing Weight: 54.9 kg  Vital Signs: Temp: 97.9 F (36.6 C) (03/29 0801) BP: 140/81 (03/29 0801) Pulse Rate: 75 (03/29 0801)  Labs: Recent Labs    11/20/22 0613 11/21/22 1118 11/22/22 0654  HGB  --   --  9.1*  HCT  --   --  29.5*  PLT  --   --  268  APTT  --  36 35  LABPROT  --  14.8  --   INR  --  1.2  --   HEPARINUNFRC  --   --  >1.10*  CREATININE 2.82* 2.66* 2.76*    Estimated Creatinine Clearance: 17.1 mL/min (A) (by C-G formula based on SCr of 2.76 mg/dL (H)).   Medications:  Medications Prior to Admission  Medication Sig Dispense Refill Last Dose   acetaminophen (TYLENOL) 650 MG CR tablet Take 650 mg by mouth every 8 (eight) hours as needed for pain or fever.   Past Week   albuterol (VENTOLIN HFA) 108 (90 Base) MCG/ACT inhaler Inhale 2 puffs into the lungs every 6 (six) hours as needed for wheezing or shortness of breath. 8 g 2 Past Week   amiodarone (PACERONE) 200 MG tablet Take 1 tablet (200 mg total) by mouth daily. 30 tablet 1 11/12/2022   apixaban (ELIQUIS) 2.5 MG TABS tablet Take 1 tablet (2.5 mg total) by mouth 2 (two) times daily. 60 tablet 6 11/12/2022 at 0600   atorvastatin (LIPITOR) 40 MG tablet Take 1 tablet (40 mg total) by mouth daily. 90 tablet 1 11/12/2022   buPROPion (WELLBUTRIN XL)  150 MG 24 hr tablet Take 1 tablet (150 mg total) by mouth daily. 90 tablet 1 11/12/2022   calcium carbonate (OS-CAL - DOSED IN MG OF ELEMENTAL CALCIUM) 1250 (500 Ca) MG tablet Take 1 tablet (500 mg of elemental calcium total) by mouth 3 (three) times daily with meals. 60 tablet 1 11/12/2022   FARXIGA 10 MG TABS tablet Take 10 mg by mouth daily.   11/12/2022   Fluticasone-Umeclidin-Vilant (TRELEGY ELLIPTA) 100-62.5-25 MCG/ACT AEPB Inhale 1 puff into the lungs daily. 3 each 3 11/12/2022   loratadine (CLARITIN) 10 MG tablet Take 10 mg by mouth daily.   11/12/2022   metoprolol succinate (TOPROL XL) 25 MG 24 hr tablet Take 1 tablet (25 mg total) by mouth daily. 30 tablet 1 11/12/2022   omeprazole (PRILOSEC) 20 MG capsule Take 20 mg by mouth daily.   11/12/2022   torsemide (DEMADEX) 20 MG tablet Take 2 tablets (40 mg) by mouth once daily. You may take an extra 2 tablets (40 mg) in the afternoon as needed for leg swelling 270 tablet 3 11/12/2022   venlafaxine XR (EFFEXOR-XR) 150 MG 24 hr capsule TAKE ONE CAPSULE BY MOUTH EVERY DAY 90 capsule 1 11/12/2022   Scheduled:   amiodarone  400 mg Oral BID   atorvastatin  40 mg Oral Daily   buPROPion  150 mg Oral Daily   calcium carbonate  1 tablet Oral TID WC   carvedilol  6.25 mg Oral BID WC   dapagliflozin propanediol  10 mg Oral Daily   fluticasone furoate-vilanterol  1 puff Inhalation Daily   And   umeclidinium bromide  1 puff Inhalation Daily   isosorbide-hydrALAZINE  1 tablet Oral TID   loratadine  10 mg Oral Daily   pantoprazole  40 mg Oral Daily   sodium chloride flush  10-40 mL Intracatheter Q12H   sodium chloride flush  3 mL Intravenous Q12H   sodium chloride flush  3 mL Intravenous Q12H   torsemide  40 mg Oral Daily   venlafaxine XR  150 mg Oral Q breakfast   Infusions:   sodium chloride     heparin 800 Units/hr (11/21/22 2049)   PRN: sodium chloride, albuterol, clonazepam, senna-docusate, sodium chloride flush, sodium chloride  flush Anti-infectives (From admission, onward)    None       Assessment: 60 YOF admitted for CHF presenting to ED with CC of symptomatic anemia. PMH includes HFpEF, paroxysmal Afib, subarachnoid hemorrhage secondary to mechanical fall, CKD stage 4, HLD, GERD, HTN, COPD. Patient takes apixaban 2.5 mg BID for Afib. Per provider, the plan is to stop apixaban and start heparin infusion. The last dose of apixaban was 3/28 at 0914. CHADSVASc 4.  Pharmacy is being consulted for initiation of heparin infusion for afib while holding apixaban.   Heparin level is still falsely elevated due to prior anticoagulation with apixaban. Using aPTT monitoring until aPTT and heparin levels correlate.   Heparin monitoring: 0329 0654 aPTT 35s, subtherapeutic   Based on subtherapeutic aPTT of 35s (goal 66-85s), increase heparin infusion to 900 units/hr. Do not bolus and use lower therapeutic range for monitoring due to possible GI bleed.   Goal of Therapy:  Heparin level 0.3-0.5 units/ml when heparin level and aPTT correlate.  aPTT 66-85 seconds Monitor platelets by anticoagulation protocol: Yes   Plan:  No bolus per MD Increase heparin infusion to 900 units/hr. Recheck aPTT at 1600 (~8 hours) and heparin level/CBC at 0500 3/30 with AM labs. Switch to heparin level monitoring once aPTT and heparin level correlate.     Darrall Dears, PharmD Candidate 11/22/2022,8:20 AM

## 2022-11-22 NOTE — Care Management Important Message (Signed)
Important Message  Patient Details  Name: Dawn Sherman MRN: [id number] Date of Birth: [birth year]   Medicare Important Message Given:  Yes     [name] 11/22/2022, 1:00 PM

## 2022-11-22 NOTE — Consult Note (Signed)
ANTICOAGULATION CONSULT NOTE - Follow Up Consult  Pharmacy Consult for heparin infusion Indication: atrial fibrillation  Allergies  Allergen Reactions   Sulfate Rash   Codeine Sulfate Nausea Only   Benicar [Olmesartan]     Talked with patient February 10, 2020, intolerance is unclear, tried several medications around that time and one of them gave her a rash but she is not clear which 1.   Amoxicillin Rash   Clindamycin/Lincomycin Rash   Entresto [Sacubitril-Valsartan] Other (See Comments)    hyperkalemia   Morphine And Related Rash   Penicillins Rash    Patient Measurements: Height: 5\' 5"  (165.1 cm) Weight: 54.9 kg (121 lb 0.5 oz) IBW/kg (Calculated) : 57 Heparin Dosing Weight: 54.9 kg  Vital Signs: Temp: 98.2 F (36.8 C) (03/29 1617) Temp Source: Oral (03/29 1617) BP: 135/82 (03/29 1617) Pulse Rate: 75 (03/29 1617)  Labs: Recent Labs    11/20/22 0613 11/21/22 1118 11/22/22 0654 11/22/22 1605  HGB  --   --  9.1*  --   HCT  --   --  29.5*  --   PLT  --   --  268  --   APTT  --  36 35 33  LABPROT  --  14.8  --   --   INR  --  1.2  --   --   HEPARINUNFRC  --   --  >1.10*  --   CREATININE 2.82* 2.66* 2.76*  --      Estimated Creatinine Clearance: 17.1 mL/min (A) (by C-G formula based on SCr of 2.76 mg/dL (H)).   Medications:  Medications Prior to Admission  Medication Sig Dispense Refill Last Dose   acetaminophen (TYLENOL) 650 MG CR tablet Take 650 mg by mouth every 8 (eight) hours as needed for pain or fever.   Past Week   albuterol (VENTOLIN HFA) 108 (90 Base) MCG/ACT inhaler Inhale 2 puffs into the lungs every 6 (six) hours as needed for wheezing or shortness of breath. 8 g 2 Past Week   amiodarone (PACERONE) 200 MG tablet Take 1 tablet (200 mg total) by mouth daily. 30 tablet 1 11/12/2022   apixaban (ELIQUIS) 2.5 MG TABS tablet Take 1 tablet (2.5 mg total) by mouth 2 (two) times daily. 60 tablet 6 11/12/2022 at 0600   atorvastatin (LIPITOR) 40 MG tablet Take 1  tablet (40 mg total) by mouth daily. 90 tablet 1 11/12/2022   buPROPion (WELLBUTRIN XL) 150 MG 24 hr tablet Take 1 tablet (150 mg total) by mouth daily. 90 tablet 1 11/12/2022   calcium carbonate (OS-CAL - DOSED IN MG OF ELEMENTAL CALCIUM) 1250 (500 Ca) MG tablet Take 1 tablet (500 mg of elemental calcium total) by mouth 3 (three) times daily with meals. 60 tablet 1 11/12/2022   FARXIGA 10 MG TABS tablet Take 10 mg by mouth daily.   11/12/2022   Fluticasone-Umeclidin-Vilant (TRELEGY ELLIPTA) 100-62.5-25 MCG/ACT AEPB Inhale 1 puff into the lungs daily. 3 each 3 11/12/2022   loratadine (CLARITIN) 10 MG tablet Take 10 mg by mouth daily.   11/12/2022   metoprolol succinate (TOPROL XL) 25 MG 24 hr tablet Take 1 tablet (25 mg total) by mouth daily. 30 tablet 1 11/12/2022   omeprazole (PRILOSEC) 20 MG capsule Take 20 mg by mouth daily.   11/12/2022   torsemide (DEMADEX) 20 MG tablet Take 2 tablets (40 mg) by mouth once daily. You may take an extra 2 tablets (40 mg) in the afternoon as needed for leg swelling 270 tablet  3 11/12/2022   venlafaxine XR (EFFEXOR-XR) 150 MG 24 hr capsule TAKE ONE CAPSULE BY MOUTH EVERY DAY 90 capsule 1 11/12/2022   Scheduled:   amiodarone  400 mg Oral BID   atorvastatin  40 mg Oral Daily   buPROPion  150 mg Oral Daily   calcium carbonate  1 tablet Oral TID WC   carvedilol  6.25 mg Oral BID WC   dapagliflozin propanediol  10 mg Oral Daily   fluticasone furoate-vilanterol  1 puff Inhalation Daily   And   umeclidinium bromide  1 puff Inhalation Daily   isosorbide-hydrALAZINE  1 tablet Oral TID   loratadine  10 mg Oral Daily   pantoprazole  40 mg Oral Daily   polyethylene glycol-electrolytes  4,000 mL Oral Once   sodium chloride flush  10-40 mL Intracatheter Q12H   sodium chloride flush  3 mL Intravenous Q12H   torsemide  40 mg Oral Daily   venlafaxine XR  150 mg Oral Q breakfast   Infusions:   sodium chloride     sodium chloride     heparin 900 Units/hr (11/22/22 0927)    PRN: sodium chloride, albuterol, clonazepam, senna-docusate, sodium chloride flush Anti-infectives (From admission, onward)    None       Assessment: 58 YOF admitted for CHF presenting to ED with CC of symptomatic anemia. PMH includes HFpEF, paroxysmal Afib, subarachnoid hemorrhage secondary to mechanical fall, CKD stage 4, HLD, GERD, HTN, COPD. Patient takes apixaban 2.5 mg BID for Afib. Per provider, the plan is to stop apixaban and start heparin infusion. The last dose of apixaban was 3/28 at 0914. CHADSVASc 4.  Pharmacy is being consulted for initiation of heparin infusion for afib while holding apixaban.   Heparin level is still falsely elevated due to prior anticoagulation with apixaban. Using aPTT monitoring until aPTT and heparin levels correlate.   Heparin monitoring: 0329 0654 aPTT 35s, subtherapeutic  0329 1605 aPTT 33s, subtherapeutic   Based on subtherapeutic aPTT of 35s (goal 66-85s), increase heparin infusion to 900 units/hr. Do not bolus and use lower therapeutic range for monitoring due to possible GI bleed.   Goal of Therapy:  Heparin level 0.3-0.5 units/ml when heparin level and aPTT correlate.  aPTT 66-85 seconds Monitor platelets by anticoagulation protocol: Yes   Plan:  Heparin subtherapeutic. Confirmed with RN, no undocumented interruptions in therapy Increase heparin infusion to 1000 units/hr. Recheck aPTT (~8 hours) following rate change and heparin level/CBC at 0500 3/30 with AM labs. Switch to heparin level monitoring once aPTT and heparin level correlate.     Dorothe Pea, PharmD, BCPS Clinical Pharmacist   11/22/2022,5:16 PM

## 2022-11-22 NOTE — Progress Notes (Signed)
Rounding Note    Patient Name: Dawn Sherman Date of Encounter: 11/22/2022  [place] Cardiologist: Ida Rogue, MD   Subjective   No chest pain, dyspnea, palpitations, hematochezia or melena. Documented UOP 4.4 L for the admission. Supplemental oxygen weaned to 1-2 L. Eliquis held, on heparin gtt for planned endoscopy this weekend with GI. Hgb improved this morning.   Inpatient Medications    Scheduled Meds:  amiodarone  400 mg Oral BID   atorvastatin  40 mg Oral Daily   buPROPion  150 mg Oral Daily   calcium carbonate  1 tablet Oral TID WC   carvedilol  6.25 mg Oral BID WC   dapagliflozin propanediol  10 mg Oral Daily   fluticasone furoate-vilanterol  1 puff Inhalation Daily   And   umeclidinium bromide  1 puff Inhalation Daily   isosorbide-hydrALAZINE  1 tablet Oral TID   loratadine  10 mg Oral Daily   pantoprazole  40 mg Oral Daily   sodium chloride flush  10-40 mL Intracatheter Q12H   sodium chloride flush  3 mL Intravenous Q12H   sodium chloride flush  3 mL Intravenous Q12H   torsemide  40 mg Oral Daily   venlafaxine XR  150 mg Oral Q breakfast   Continuous Infusions:  sodium chloride     heparin 800 Units/hr (11/21/22 2049)   PRN Meds: sodium chloride, albuterol, clonazepam, senna-docusate, sodium chloride flush, sodium chloride flush   Vital Signs    Vitals:   11/21/22 1939 11/21/22 2353 11/22/22 0414 11/22/22 0801  BP: 127/74 (!) 108/50 123/65 (!) 140/81  Pulse: 65 70 70 75  Resp: 18 18 18 18   Temp: 98.2 F (36.8 C) 97.7 F (36.5 C) 97.7 F (36.5 C) 97.9 F (36.6 C)  TempSrc:      SpO2: 98% 97% 97% 100%  Weight:   54.9 kg   Height:        Intake/Output Summary (Last 24 hours) at 11/22/2022 0828 Last data filed at 11/21/2022 1927 Gross per 24 hour  Intake 920 ml  Output --  Net 920 ml       11/22/2022    4:14 AM 11/20/2022    5:00 AM 11/19/2022    5:00 AM  Last 3 Weights  Weight (lbs) 121 lb 0.5 oz 126 lb 1.7 oz 126 lb 1.7  oz  Weight (kg) 54.9 kg 57.2 kg 57.2 kg      Telemetry    Sinus with first degree AVB rates 60's-80s bpm - Personally Reviewed  ECG    No new tracings - Personally Reviewed  Physical Exam   GEN: No acute distress.   Neck: No JVD Cardiac: RRR, no murmurs, rubs, or gallops.  Respiratory: Clear to auscultation GI: Soft, nontender, non-distended  MS: No edema; No deformity. Neuro:  Nonfocal  Psych: Normal affect   Labs    High Sensitivity Troponin:   Recent Labs  Lab 11/12/22 1357 11/12/22 1858  TROPONINIHS 10 9      Chemistry Recent Labs  Lab 11/18/22 0429 11/19/22 0452 11/20/22 0613 11/21/22 1118 11/22/22 0654  NA 133* 132* 135 134* 134*  K 4.3 4.2 4.7 4.5 4.8  CL 94* 98 100 98 100  CO2 27 26 27 25 26   GLUCOSE 91 160* 109* 207* 97  BUN 86* 76* 73* 64* 67*  CREATININE 3.19* 2.91* 2.82* 2.66* 2.76*  CALCIUM 8.8* 8.5* 8.4* 8.7* 8.3*  MG 2.2 2.2 2.1  --   --  GFRNONAA 15* 17* 18* 19* 18*  ANIONGAP 12 8 8 11 8      Lipids No results for input(s): "CHOL", "TRIG", "HDL", "LABVLDL", "LDLCALC", "CHOLHDL" in the last 168 hours.  Hematology Recent Labs  Lab 11/15/22 1231 11/15/22 1235 11/22/22 0654  WBC  --   --  8.6  RBC  --   --  3.50*  HGB 8.2* 8.2* 9.1*  HCT 24.0* 24.0* 29.5*  MCV  --   --  84.3  MCH  --   --  26.0  MCHC  --   --  30.8  RDW  --   --  21.8*  PLT  --   --  268    Thyroid No results for input(s): "TSH", "FREET4" in the last 168 hours.  BNP No results for input(s): "BNP", "PROBNP" in the last 168 hours.   DDimer No results for input(s): "DDIMER" in the last 168 hours.   Radiology    DG Chest 2 View  Result Date: 11/20/2022 IMPRESSION: 1. Interval decreased volume of right pleural effusion with improved aeration to the right lung base. 2. Mild edema. Electronically Signed   By: Kerby Moors M.D.   On: 11/20/2022 15:03    Cardiac Studies  TTE 11/14/22 1. Left ventricular ejection fraction, by estimation, is 25 to 30%. The  left  ventricle has severely decreased function. The left ventricle  demonstrates global hypokinesis. There is moderate left ventricular  hypertrophy. Left ventricular diastolic  parameters are indeterminate.   2. Right ventricular systolic function is moderately reduced. The right  ventricular size is normal. There is moderately elevated pulmonary artery  systolic pressure. The estimated right ventricular systolic pressure is  [id number] mmHg.   3. Left atrial size was severely dilated.   4. A small pericardial effusion is present.   5. The mitral valve is normal in structure. Severe mitral valve  regurgitation. No evidence of mitral stenosis.   6. Tricuspid valve regurgitation is moderate to severe.   7. The aortic valve is normal in structure. Aortic valve regurgitation is  not visualized. No aortic stenosis is present.   8. The inferior vena cava is normal in size with greater than 50%  respiratory variability, suggesting right atrial pressure of 3 mmHg.    [place] 11/15/2022 Right heart catheterization pressures RA (mean): 21 mmHg RV (S/EDP): 62/24 mmHg PA (S/D, mean): 63/38 (46) mmHg PCWP (mean): 34 mmHg  Ao sat: 91% PA sat: 30%  Fick CO: 3.4 L/min Fick CI: 2.0 L/min/m^2  PVR: 3.5 Wood units PAPi: 1.2    Patient Profile     67 y.o. female with a past medical history of current tobacco use, COPD, paroxysmal atrial fibrillation/atrial flutter, moderate MR, chronic kidney disease stage IV, cardiomyopathy with an ejection fraction of 30-35% in the setting of atrial fibrillation/flutter on apixaban who presents to the hospital via the cardiology clinic for worsening shortness of breath and anemia.  Assessment & Plan    Acute on chronic combined systolic and diastolic congestive heart failure -Severely reduced function on echo with LVEF 25-30% -Recent stress testing revealed a fixed defect without significant ischemia -RHC on 11/15/22 with RA 21, PA 63/38, PCWP 34, C.O. 3.4, C.I 2.0 -She  was treated with milrinone which was discontinued 3/26, she has continued to improve -She appears to be euvolemic on torsemide 40 mg once daily and Farxiga 10 mg daily -Continue titrated dose of carvedilol to 6.25 mg twice daily -No ACE inhibitor, ARB, ARNI or MRA due to  acute on chronic kidney disease For now, continue newly started BiDil  -If renal function improves, this can be switched to [facility] in the outpatient setting -Given progressive drop in her ejection fraction, we should consider left heart catheterization with low contrast at some point, hopefully this can be done once her GFR is above 30, timing TBD, perhaps prior to discharge early next week if she remains admitted -As outpatient, would look to have her establish with the advanced heart failure team  Acute on chronic anemia -Hgb improved this morning at 9.1 -No active signs of bleeding -Iron given 11/13/22 -Has received blood this admission -GI planning for EGD and colonoscopy 3/30  Paroxysmal atrial fibrillation/flutter -A-fib with RVR was likely the culprit for decompensated heart failure -She converted to sinus rhythm with IV amiodarone -She is now on oral amiodarone 400 mg twice daily which should be continued for another 2 days then decrease to 200 mg twice daily for a week then 200 mg once daily thereafter -Eliquis on hold, on heparin gtt, for washout for planned EGD and colonoscopy 3/30  Chronic kidney disease stage IV -Followed by nephrology -Renal function stable  Long history of tobacco use with COPD -She reports that she quit smoking last month -Continued on oxygen therapy    CHA2DS2-VASc Score = 5   This indicates a 7.2% annual risk of stroke. The patient's score is based upon: CHF History: 1 HTN History: 1 Diabetes History: 0 Stroke History: 0 Vascular Disease History: 1 Age Score: 1 Gender Score: 1     For questions or updates, please contact [place] Please consult  [email or web address] for contact info under        Signed, Christell Faith, PA-C  11/22/2022, 8:28 AM

## 2022-11-22 NOTE — Progress Notes (Signed)
[name] , MD [address], [place], [place], [place], [address] [address] [place], [place], [place], [address] Phone: [phone]  Fax: [phone]   CARINE GUILLET is being followed for iron deficiency anemia  follow up   Subjective: Feels well , not short of breath , able to lay flat . No external blood loss reported  Objective: Vital signs in last 24 hours: Vitals:   11/21/22 1657 11/21/22 1939 11/21/22 2353 11/22/22 0414  BP: 127/68 127/74 (!) 108/50 123/65  Pulse: 70 65 70 70  Resp:  18 18 18   Temp:  98.2 F (36.8 C) 97.7 F (36.5 C) 97.7 F (36.5 C)  TempSrc:      SpO2:  98% 97% 97%  Weight:    54.9 kg  Height:       Weight change:   Intake/Output Summary (Last 24 hours) at 11/22/2022 0751 Last data filed at 11/21/2022 1927 Gross per 24 hour  Intake 920 ml  Output --  Net 920 ml     Exam: Heart:: Regular rate and rhythm Lungs: normal Abdomen: soft, nontender, normal bowel sounds   Lab Results: @LABTEST2 @ Micro Results: Recent Results (from the past 240 hour(s))  MRSA Next Gen by PCR, Nasal     Status: None   Collection Time: 11/15/22  2:17 PM   Specimen: Nasal Mucosa; Nasal Swab  Result Value Ref Range Status   MRSA by PCR Next Gen NOT DETECTED NOT DETECTED Final    Comment: (NOTE) The GeneXpert MRSA Assay (FDA approved for NASAL specimens only), is one component of a comprehensive MRSA colonization surveillance program. It is not intended to diagnose MRSA infection nor to guide or monitor treatment for MRSA infections. Test performance is not FDA approved in patients less than 45 years old. Performed at [facility], [address] [place], [place] [address]    Studies/Results: [place] Chest 2 View  Result Date: 11/20/2022 CLINICAL DATA:  Evaluate pleural effusions. EXAM: CHEST - 2 VIEW COMPARISON:  11/15/2022 FINDINGS: There is a right IJ catheter with tip in the projection of the cavoatrial junction. Stable  cardiomediastinal contours. Interval decreased volume of right pleural effusion with improved aeration to the right lung base. Trace septal markings identified within the lower lung zones compatible with mild edema. No airspace disease. IMPRESSION: 1. Interval decreased volume of right pleural effusion with improved aeration to the right lung base. 2. Mild edema. Electronically Signed   By: Kerby Moors M.D.   On: 11/20/2022 15:03   Medications: I have reviewed the patient's current medications. Scheduled Meds:  amiodarone  400 mg Oral BID   atorvastatin  40 mg Oral Daily   buPROPion  150 mg Oral Daily   calcium carbonate  1 tablet Oral TID WC   carvedilol  6.25 mg Oral BID WC   dapagliflozin propanediol  10 mg Oral Daily   fluticasone furoate-vilanterol  1 puff Inhalation Daily   And   umeclidinium bromide  1 puff Inhalation Daily   isosorbide-hydrALAZINE  1 tablet Oral TID   loratadine  10 mg Oral Daily   pantoprazole  40 mg Oral Daily   sodium chloride flush  10-40 mL Intracatheter Q12H   sodium chloride flush  3 mL Intravenous Q12H   sodium chloride flush  3 mL Intravenous Q12H   torsemide  40 mg Oral Daily   venlafaxine XR  150 mg Oral Q breakfast   Continuous Infusions:  sodium chloride     heparin  800 Units/hr (11/21/22 2049)   PRN Meds:.sodium chloride, albuterol, clonazepam, senna-docusate, sodium chloride flush, sodium chloride flush   Assessment: Principal Problem:   Acute on chronic combined systolic and diastolic CHF (congestive heart failure) (HCC) Active Problems:   Depression, major, single episode, mild (HCC)   Tobacco use   HLD (hyperlipidemia)   Persistent atrial fibrillation (HCC)   Essential hypertension   GERD without esophagitis   CKD (chronic kidney disease), stage IV (HCC)   Acute hypoxemic respiratory failure (HCC)   Dyspnea   Symptomatic anemia   Hyperkalemia   Hypoxia   Dilated cardiomyopathy ([place])   Mitral valve insufficiency Dawn Sherman  is a 67 y.o. y/o female with a history of paroxysmal atrial fibrillation heart failure admitted with hypoxemic respiratory failure atrial fibrillation and found to have severe iron deficiency anemia.  I have been consulted to evaluate for the iron deficiency anemia.    Differentials for iron deficiency anemia include AVMs in view of chronic kidney disease and neoplasm .  Hemoglobin today 9.1 g.  Celiac serology negative   History of paroxysmal atrial fibrillation Eliquis has been held for GI workup.  Will plan for EGD and colonoscopy tomorrow, if  negative can have capsule study as an outpatient. I have discussed the patient with anesthesia to confirm ok to proceed with procedures in view of severe rt heart pressure elevation .   I have discussed alternative options, risks & benefits,  which include, but are not limited to, bleeding, infection, perforation,respiratory complication & drug reaction.  The patient agrees with this plan & written consent will be obtained.       LOS: 10 days   [name], MD 11/22/2022, 7:51 AM

## 2022-11-22 NOTE — Progress Notes (Signed)
[facility]  ROUNDING NOTE   Subjective:   [name] was admitted to [facility] on 11/12/2022 for Hypoxia [R09.02] Symptomatic anemia [D64.9] Anemia, unspecified type [D64.9] Dyspnea, unspecified type [R06.00]  Patient was last seen by nephrology on 11/20/2021 for her chronic kidney disease by Dr. [name].   No complaints this morning. Creatinine remains stable.   GI workup scheduled for tomorrow.  Anticipation of left heart catheterization next week.    Objective:  Vital signs in last 24 hours:  Temp:  [97.7 F (36.5 C)-98.6 F (37 C)] 97.8 F (36.6 C) (03/29 1135) Pulse Rate:  [65-75] 70 (03/29 1135) Resp:  [16-18] 18 (03/29 1135) BP: (108-140)/(50-81) 125/71 (03/29 1135) SpO2:  [97 %-100 %] 99 % (03/29 1135) Weight:  [54.9 kg] 54.9 kg (03/29 0414)  Weight change:  Filed Weights   11/19/22 0500 11/20/22 0500 11/22/22 0414  Weight: 57.2 kg 57.2 kg 54.9 kg    Intake/Output: I/O last 3 completed shifts: In: 1640 [P.O.:1640] Out: -    Intake/Output this shift:  Total I/O In: 240 [P.O.:240] Out: -   Physical Exam: General: NAD, sitting up in bed  Head: Normocephalic, atraumatic. Moist oral mucosal membranes  Eyes: Anicteric  Lungs:  Clear to auscultation, normal effort  Heart: Regular  Abdomen:  Soft, nontender, nondistended  Extremities: No peripheral edema.  Neurologic: Nonfocal, moving all four extremities  Skin: No lesions    Basic Metabolic Panel: Recent Labs  Lab 11/16/22 0326 11/17/22 0421 11/18/22 0429 11/19/22 0452 11/20/22 0613 11/21/22 1118 11/22/22 0654  NA 136 133* 133* 132* 135 134* 134*  K 4.7 4.1 4.3 4.2 4.7 4.5 4.8  CL 103 96* 94* 98 100 98 100  CO2 26 27 27 26 27 25 26   GLUCOSE 92 103* 91 160* 109* 207* 97  BUN 64* 70* 86* 76* 73* 64* 67*  CREATININE 2.91* 3.23* 3.19* 2.91* 2.82* 2.66* 2.76*  CALCIUM 9.1 8.9 8.8* 8.5* 8.4* 8.7* 8.3*  MG 2.5* 2.3 2.2 2.2 2.1  --   --      Liver Function Tests: No results for  input(s): "AST", "ALT", "ALKPHOS", "BILITOT", "PROT", "ALBUMIN" in the last 168 hours.  No results for input(s): "LIPASE", "AMYLASE" in the last 168 hours. No results for input(s): "AMMONIA" in the last 168 hours.  CBC: Recent Labs  Lab 11/22/22 0654  WBC 8.6  HGB 9.1*  HCT 29.5*  MCV 84.3  PLT 268     Cardiac Enzymes: No results for input(s): "CKTOTAL", "CKMB", "CKMBINDEX", "TROPONINI" in the last 168 hours.  BNP: Invalid input(s): "POCBNP"  CBG: Recent Labs  Lab 11/15/22 1417  GLUCAP 93     Microbiology: Results for orders placed or performed during the hospital encounter of 11/12/22  MRSA Next Gen by PCR, Nasal     Status: None   Collection Time: 11/15/22  2:17 PM   Specimen: Nasal Mucosa; Nasal Swab  Result Value Ref Range Status   MRSA by PCR Next Gen NOT DETECTED NOT DETECTED Final    Comment: (NOTE) The GeneXpert MRSA Assay (FDA approved for NASAL specimens only), is one component of a comprehensive MRSA colonization surveillance program. It is not intended to diagnose MRSA infection nor to guide or monitor treatment for MRSA infections. Test performance is not FDA approved in patients less than 24 years old. Performed at [facility], [address], [place], [place] [address]     Coagulation Studies: Recent Labs    11/21/22 1118  LABPROT 14.8  INR 1.2     Urinalysis: No results for input(s): "COLORURINE", "LABSPEC", "PHURINE", "GLUCOSEU", "HGBUR", "BILIRUBINUR", "KETONESUR", "PROTEINUR", "UROBILINOGEN", "NITRITE", "LEUKOCYTESUR" in the last 72 hours.  Invalid input(s): "APPERANCEUR"     Imaging: DG Chest 2 View  Result Date: 11/20/2022 CLINICAL DATA:  Evaluate pleural effusions. EXAM: CHEST - 2 VIEW COMPARISON:  11/15/2022 FINDINGS: There is a right IJ catheter with tip in the projection of the cavoatrial junction. Stable cardiomediastinal contours. Interval decreased volume of right pleural effusion with improved aeration to the  right lung base. Trace septal markings identified within the lower lung zones compatible with mild edema. No airspace disease. IMPRESSION: 1. Interval decreased volume of right pleural effusion with improved aeration to the right lung base. 2. Mild edema. Electronically Signed   By: Kerby Moors M.D.   On: 11/20/2022 15:03     Medications:    sodium chloride     heparin 900 Units/hr (11/22/22 0927)    amiodarone  400 mg Oral BID   atorvastatin  40 mg Oral Daily   buPROPion  150 mg Oral Daily   calcium carbonate  1 tablet Oral TID WC   carvedilol  6.25 mg Oral BID WC   dapagliflozin propanediol  10 mg Oral Daily   fluticasone furoate-vilanterol  1 puff Inhalation Daily   And   umeclidinium bromide  1 puff Inhalation Daily   isosorbide-hydrALAZINE  1 tablet Oral TID   loratadine  10 mg Oral Daily   pantoprazole  40 mg Oral Daily   sodium chloride flush  10-40 mL Intracatheter Q12H   sodium chloride flush  3 mL Intravenous Q12H   torsemide  40 mg Oral Daily   venlafaxine XR  150 mg Oral Q breakfast   sodium chloride, albuterol, clonazepam, senna-docusate, sodium chloride flush  Assessment/ Plan:  [name] is a 67 y.o.  female with hypertension, atrial fibrillation, chronic congestive heart failure, atrophic right kidney, endometriosis, hyperlipidemia, tobacco abuse, GERD, and depression who is admitted to [facility] on 11/12/2022 for Hypoxia [R09.02] Symptomatic anemia [D64.9] Anemia, unspecified type [D64.9] Dyspnea, unspecified type [R06.00]  AKI on Chronic kidney disease stage IV: with hyperkalemia and proteinuria. Baseline creatinine appears to be 2.49 in January. Renal function has progressively decreased in the last few months. Patient most likely at new baseline renal function.  - allergy to ARB - Continue dapagliflozin - Continue torsemide.  - Will need IVF prior to left heart catheterization.   Hypertension and acute exacerbation of systolic congestive heart failure:  echocardiogram with EF of 25-30%.  - Appreciate cardiology input.   -Continue torsemide 40 mg daily, carvedilol, and Bidil.   Anemia with chronic kidney disease and iron deficiency: negative for GI bleed. Hemoglobin 9.1  - appreciate GI input. EGD for tomorrow.  -IV iron given on 11/13/22  Secondary Hyperparathyroidism: with hypocalcemia. PTH 92 from 11/13/2021 - continue calcium carbonate.    LOS: 10 Tanner Yeley 3/29/20241:17 PM

## 2022-11-22 NOTE — Progress Notes (Signed)
Progress Note   Patient: Dawn Sherman [id number] DOB: Mar 31, 1956 DOA: 11/12/2022     10 DOS: the patient was seen and examined on 11/22/2022   Brief hospital course: [name] is a 67 year old female with history of heart failure preserved ejection fraction, paroxysmal atrial fibrillation on low-dose Eliquis, history of subarachnoid hemorrhage secondary to mechanical fall, CKD stage IV, COPD, hypertension, who presents to emergency department from cardiology outpatient clinic for chief concerns of symptomatic anemia.   Vitals in the ED showed temperature of 97.8, respiration rate 18, heart rate 66, blood pressure 116/66, SpO2 of 97% on room air.   Serum sodium is 134, potassium 5.2, chloride 99, bicarb 25, BUN 40, serum creatinine of 2.71, nonfasting blood glucose 131, WBC 8.1, hemoglobin 7.7, platelets of 308.  eGFR 19.  BNP elevated at 3247.   High sensitive troponin has been ordered and is in process.   ED treatment: None   3/20: Vital stable on 4 L of oxygen this morning.  Apparently patient developed shortness of breath with flushing and muscle aches while getting blood transfusion which was discontinued at that time UA and transfusion reaction labs were negative.  Chest x-ray with pulmonary vascular congestion s/p IV Lasix and Benadryl.  CBC this morning with hemoglobin of 8.  Anemia panel with iron deficiency.  IV iron supplement ordered. BMP with slight worsening of creatinine to 2.96.  And potassium of 5.3.  Nephrology was also consulted.   3/21: Vitals and labs seems stable.  Remained on 3 L of oxygen.  Cardiology was also consulted and patient will be going for right heart catheterization tomorrow.   3/22: Vital stable, renal function continued to get worse slowly, persistent hyperkalemia with potassium at 5.4 despite getting Veltassa - dose was increased today. Echocardiogram with worsening of EF and right heart catheterization today with significantly elevated pressures.   Patient was started on milrinone and Lasix infusion by cardiology.   3/23: Vital stable, remained on 3 L of oxygen.  Hemoglobin improved to 10, slight worsening of renal function with creatinine at 2.91, UOP recorded 2350, patient remains on milrinone and Lasix infusion.   3/24: Hemodynamically stable.  Worsening renal function, UOP recorded of more than 5 L in 24 hours, patient also received metolazone.  CVP or 10.  After discussing with cardiology and nephrology we are holding Lasix infusion today.  A-fib with RVR today heart rate cardiology switched to p.o. amiodarone with IV bolus and infusion.   3/25: Vital stable.  Converted back to sinus rhythm.  Creatinine at 3.19, nephrology decided to continue holding Lasix for another day.  Milrinone dose was decreased today.  Will remain on amiodarone infusion with a plan to convert to p.o. tomorrow.   3/26: Hemodynamically stable.  Renal functions with slow improvement, good UOP.  Nephrology restarted [place].  Remained on amiodarone and milrinone infusions.  Most likely will be converted to p.o. amiodarone today.  3/27: continues to improve. Off milronone. Coreg increased. Cardiology tentatively planning on left heart cath  3/29: stable, awaiting endoluminal eval tomorrow  Assessment and Plan: * Acute on chronic combined systolic and diastolic CHF (congestive heart failure) ([place]) Repeat echo done with worsening of EF, right heart cath  with significantly elevated pressures. Patient was transferred to ICU to start milrinone and Lasix infusion. Now off those -Continue with strict intake and output -Daily BMP and weight -cardiology following -continue torsemide and farxiga and bi-dil and co-reg - possible cath on Monday, versus as outpatient  Acute hypoxemic respiratory failure (HCC) Likely multifactorial in setting of symptomatic acute on chronic anemia and small bilateral pleural effusion Procalcitonin negative.  Patient uses 2-3 L of oxygen at  night at home. -Continue supplemental oxygen-wean as tolerated  Symptomatic anemia stable Patient's baseline hemoglobin over the last 2 months has been 9.5-10.6 S/p partial 1 unit of PRBC as she developed worsening shortness of breath while getting transfusion which was discontinued, patient also received IV Lasix overnight.  Anemia panel with some iron deficiency and also consistent with anemia of chronic disease. Last colonoscopy 2015. No hx of gi bleed Denies any obvious bleeding or melena GI following, tentative plan for endoluminal eval on Saturday  Persistent atrial fibrillation ([place]) Spontaneously converted back to sinus rhythm with amiodarone infusion. Now on po amiodarone. Apixaban paused, heparin bridge infusing  Hyperkalemia Resolved  CKD (chronic kidney disease), stage IV (HCC) Patient with slowly improving renal function now, creatinine stable in upper 2s, baseline ckd 4 with cr of ~2.5 - nephrology following  HLD (hyperlipidemia) Atorvastatin 40 mg daily resumed  Tobacco use Patient reports that she has quit, her last cigarette was 1.5 months ago  Essential hypertension Bp appropriate on above meds  Dyslipidemia Atorvastatin 40 mg daily resumed  Depression, major, single episode, mild (HCC) Bupropion 150 mg and venlafaxine 150 mg daily resumed  GERD without esophagitis PPI resumed   Subjective: Patient was seen and examined today.  No new complaints. Feeling well. No chest pain or dyspnea.    Physical Exam: Vitals:   11/21/22 2353 11/22/22 0414 11/22/22 0801 11/22/22 1135  BP: (!) 108/50 123/65 (!) 140/81 125/71  Pulse: 70 70 75 70  Resp: 18 18 18 18   Temp: 97.7 F (36.5 C) 97.7 F (36.5 C) 97.9 F (36.6 C) 97.8 F (36.6 C)  TempSrc:      SpO2: 97% 97% 100% 99%  Weight:  54.9 kg    Height:       General.  Frail lady, in no acute distress. Pulmonary.  Lungs clear bilaterally, normal respiratory effort. CV.  Regular rate and rhythm, no JVD, rub  or murmur. Abdomen.  Soft, nontender, nondistended, BS positive. CNS.  Alert and oriented .  No focal neurologic deficit. Extremities.  No edema, no cyanosis, pulses intact and symmetrical. Psychiatry.  Judgment and insight appears normal.    Data Reviewed: Prior data reviewed  Family Communication: husband updated @ bedside 3/28  Disposition: Status is: Inpatient Remains inpatient appropriate because: need for inpatient evaluation  Planned Discharge Destination: Home, no home health needs identified by PT  Time spent: 25 min  Author: Desma Maxim, MD 11/22/2022 1:47 PM  For on call review [email or web address].

## 2022-11-23 ENCOUNTER — Inpatient Hospital Stay: Payer: Medicare Other | Admitting: Registered Nurse

## 2022-11-23 ENCOUNTER — Encounter: Payer: Self-pay | Admitting: Internal Medicine

## 2022-11-23 ENCOUNTER — Encounter
Admission: EM | Disposition: A | Discharge status: Home Health | Payer: Self-pay | Source: Home / Self Care | Attending: Internal Medicine

## 2022-11-23 DIAGNOSIS — I480 Paroxysmal atrial fibrillation: Secondary | ICD-10-CM | POA: Diagnosis not present

## 2022-11-23 DIAGNOSIS — I5043 Acute on chronic combined systolic (congestive) and diastolic (congestive) heart failure: Secondary | ICD-10-CM | POA: Diagnosis not present

## 2022-11-23 HISTORY — PX: COLONOSCOPY WITH PROPOFOL: SHX5780

## 2022-11-23 HISTORY — PX: ESOPHAGOGASTRODUODENOSCOPY (EGD) WITH PROPOFOL: SHX5813

## 2022-11-23 LAB — CBC
HCT: 27.4 % — ABNORMAL LOW (ref 36.0–46.0)
Hemoglobin: 8.3 g/dL — ABNORMAL LOW (ref 12.0–15.0)
MCH: 25.9 pg — ABNORMAL LOW (ref 26.0–34.0)
MCHC: 30.3 g/dL (ref 30.0–36.0)
MCV: 85.4 fL (ref 80.0–100.0)
Platelets: 214 10*3/uL (ref 150–400)
RBC: 3.21 MIL/uL — ABNORMAL LOW (ref 3.87–5.11)
RDW: 22 % — ABNORMAL HIGH (ref 11.5–15.5)
WBC: 6.4 10*3/uL (ref 4.0–10.5)
nRBC: 0 % (ref 0.0–0.2)

## 2022-11-23 LAB — BASIC METABOLIC PANEL
Anion gap: 9 (ref 5–15)
BUN: 61 mg/dL — ABNORMAL HIGH (ref 8–23)
CO2: 23 mmol/L (ref 22–32)
Calcium: 8.4 mg/dL — ABNORMAL LOW (ref 8.9–10.3)
Chloride: 100 mmol/L (ref 98–111)
Creatinine, Ser: 2.53 mg/dL — ABNORMAL HIGH (ref 0.44–1.00)
GFR, Estimated: 20 mL/min — ABNORMAL LOW (ref >60)
Glucose, Bld: 87 mg/dL (ref 70–99)
Potassium: 5.3 mmol/L — ABNORMAL HIGH (ref 3.5–5.1)
Sodium: 132 mmol/L — ABNORMAL LOW (ref 135–145)

## 2022-11-23 LAB — APTT
aPTT: 36 seconds (ref 24–36)
aPTT: 142 seconds — ABNORMAL HIGH (ref 24–36)
aPTT: 117 seconds — ABNORMAL HIGH (ref 24–36)

## 2022-11-23 LAB — HEPARIN LEVEL (UNFRACTIONATED): Heparin Unfractionated: 0.9 IU/mL — ABNORMAL HIGH (ref 0.30–0.70)

## 2022-11-23 LAB — POTASSIUM: Potassium: 5.8 mmol/L — ABNORMAL HIGH (ref 3.5–5.1)

## 2022-11-23 SURGERY — ESOPHAGOGASTRODUODENOSCOPY (EGD) WITH PROPOFOL
Anesthesia: General

## 2022-11-23 MED ORDER — HEPARIN BOLUS VIA INFUSION
1650.0000 [IU] | Freq: Once | INTRAVENOUS | Status: DC
  Filled 2022-11-23: qty 1650

## 2022-11-23 MED ORDER — LIDOCAINE HCL (CARDIAC) PF 100 MG/5ML IV SOSY
PREFILLED_SYRINGE | INTRAVENOUS | Status: DC | PRN
  Administered 2022-11-23: 40 mg via INTRAVENOUS

## 2022-11-23 MED ORDER — HEPARIN (PORCINE) 25000 UT/250ML-% IV SOLN
850.0000 [IU]/h | INTRAVENOUS | Status: AC
  Administered 2022-11-23: 850 [IU]/h via INTRAVENOUS

## 2022-11-23 MED ORDER — PEG 3350-KCL-NA BICARB-NACL 420 G PO SOLR
4000.0000 mL | Freq: Once | ORAL | Status: AC
  Administered 2022-11-23: 4000 mL via ORAL
  Filled 2022-11-23: qty 4000

## 2022-11-23 MED ORDER — ONDANSETRON HCL 4 MG/2ML IJ SOLN
4.0000 mg | Freq: Four times a day (QID) | INTRAMUSCULAR | Status: DC | PRN
  Administered 2022-11-23: 4 mg via INTRAVENOUS
  Filled 2022-11-23: qty 2

## 2022-11-23 MED ORDER — PROPOFOL 1000 MG/100ML IV EMUL
INTRAVENOUS | Status: AC
  Filled 2022-11-23: qty 100

## 2022-11-23 MED ORDER — LIDOCAINE HCL (PF) 2 % IJ SOLN
INTRAMUSCULAR | Status: AC
  Filled 2022-11-23: qty 5

## 2022-11-23 MED ORDER — EPHEDRINE SULFATE (PRESSORS) 50 MG/ML IJ SOLN
INTRAMUSCULAR | Status: DC | PRN
  Administered 2022-11-23: 10 mg via INTRAVENOUS

## 2022-11-23 MED ORDER — PROPOFOL 500 MG/50ML IV EMUL
INTRAVENOUS | Status: DC | PRN
  Administered 2022-11-23: 150 ug/kg/min via INTRAVENOUS

## 2022-11-23 MED ORDER — PROPOFOL 10 MG/ML IV BOLUS
INTRAVENOUS | Status: DC | PRN
  Administered 2022-11-23 (×2): 60 mg via INTRAVENOUS

## 2022-11-23 MED ORDER — SODIUM CHLORIDE 0.9 % IV SOLN: INTRAVENOUS | Status: DC

## 2022-11-23 MED ORDER — PROPOFOL 10 MG/ML IV BOLUS
INTRAVENOUS | Status: AC
  Filled 2022-11-23: qty 40

## 2022-11-23 MED ORDER — HEPARIN (PORCINE) 25000 UT/250ML-% IV SOLN: 950.0000 [IU]/h | INTRAVENOUS | Status: DC

## 2022-11-23 NOTE — Op Note (Signed)
[facility] Gastroenterology Patient Name: Dawn Sherman Procedure Date: 11/23/2022 8:25 AM MRN: [id number] Account #: [id number] Date of Birth: Sep 19, 1955 Admit Type: Inpatient Age: 67 Room: [facility] ENDO ROOM 4 Gender: Female Note Status: Finalized Instrument Name: Peds Colonoscope [id number] Procedure:             Colonoscopy Indications:           Iron deficiency anemia Providers:             Andrey Farmer MD, MD Referring MD:          [name], MD (Referring MD) Medicines:             Monitored Anesthesia Care Complications:         No immediate complications. Procedure:             Pre-Anesthesia Assessment:                        - Prior to the procedure, a History and Physical was                         performed, and patient medications and allergies were                         reviewed. The patient is competent. The risks and                         benefits of the procedure and the sedation options and                         risks were discussed with the patient. All questions                         were answered and informed consent was obtained.                         Patient identification and proposed procedure were                         verified by the physician, the nurse, the                         anesthesiologist, the anesthetist and the technician                         in the endoscopy suite. Mental Status Examination:                         alert and oriented. Airway Examination: normal                         oropharyngeal airway and neck mobility. Respiratory                         Examination: clear to auscultation. CV Examination:                         normal. Prophylactic Antibiotics: The patient does not  require prophylactic antibiotics. Prior                         Anticoagulants: The patient has taken heparin, last                         dose was day of procedure. ASA Grade Assessment: IV -                          A patient with severe systemic disease that is a                         constant threat to life. After reviewing the risks and                         benefits, the patient was deemed in satisfactory                         condition to undergo the procedure. The anesthesia                         plan was to use monitored anesthesia care (MAC).                         Immediately prior to administration of medications,                         the patient was re-assessed for adequacy to receive                         sedatives. The heart rate, respiratory rate, oxygen                         saturations, blood pressure, adequacy of pulmonary                         ventilation, and response to care were monitored                         throughout the procedure. The physical status of the                         patient was re-assessed after the procedure.                        After obtaining informed consent, the colonoscope was                         passed under direct vision. Throughout the procedure,                         the patient's blood pressure, pulse, and oxygen                         saturations were monitored continuously. The                         Colonoscope was introduced through the anus with the  intention of advancing to the cecum. The scope was                         advanced to the sigmoid colon before the procedure was                         aborted. Medications were given. The colonoscopy was                         aborted due to poor bowel prep with stool present. Findings:      The perianal and digital rectal examinations were normal.      A large amount of solid stool was found in the rectum, in the       recto-sigmoid colon and in the sigmoid colon, precluding visualization. Impression:            - The procedure was aborted due to poor bowel prep                         with stool present.                         - Stool in the rectum, in the recto-sigmoid colon and                         in the sigmoid colon.                        - No specimens collected. Recommendation:        - Return patient to hospital ward for ongoing care.                        - Clear liquid diet.                        - Continue present medications.                        - Repeat colonoscopy tomorrow because the bowel                         preparation was poor. Will need to hold heparin drip                         starting at 3:30 am Procedure Code(s):     --- Professional ---                        [phone], Colonoscopy, flexible; diagnostic, including                         collection of specimen(s) by brushing or washing, when                         performed (separate procedure) Diagnosis Code(s):     --- Professional ---                        D50.9, Iron deficiency anemia, unspecified CPT copyright 2022 American Medical Association. All rights reserved. The codes documented in this  report are preliminary and upon coder review may  be revised to meet current compliance requirements. Andrey Farmer MD, MD 11/23/2022 9:18:32 AM Number of Addenda: 0 Note Initiated On: 11/23/2022 8:25 AM Total Procedure Duration: 0 hours 4 minutes 10 seconds  Estimated Blood Loss:  Estimated blood loss: none.      [facility]

## 2022-11-23 NOTE — Transfer of Care (Signed)
Immediate Anesthesia Transfer of Care Note  Patient: Dawn Sherman  Procedure(s) Performed: Procedure(s): ESOPHAGOGASTRODUODENOSCOPY (EGD) WITH PROPOFOL (N/A) COLONOSCOPY WITH PROPOFOL (N/A)  Patient Location: PACU and Endoscopy Unit  Anesthesia Type:General  Level of Consciousness: sedated  Airway & Oxygen Therapy: Patient Spontanous Breathing and Patient connected to nasal cannula oxygen  Post-op Assessment: Report given to RN and Post -op Vital signs reviewed and stable  Post vital signs: Reviewed and stable  Last Vitals:  Vitals:   11/23/22 0821 11/23/22 0915  BP: 134/83 (!) 112/58  Pulse: 74 69  Resp: (!) 6 19  Temp: (!) 36.1 C (!) 36.1 C  SpO2: [id number]    Complications: No apparent anesthesia complications

## 2022-11-23 NOTE — Anesthesia Preprocedure Evaluation (Signed)
Anesthesia Evaluation  Patient identified by MRN, date of birth, ID band Patient awake    Reviewed: Allergy & Precautions, H&P , NPO status , Patient's Chart, lab work & pertinent test results, reviewed documented beta blocker date and time   History of Anesthesia Complications Negative for: history of anesthetic complications  Airway Mallampati: I  TM Distance: >3 FB Neck ROM: full    Dental  (+) Poor Dentition, Missing, Upper Dentures, Edentulous Upper, Dental Advidsory Given   Pulmonary shortness of breath (on oxygen now) and with exertion, neg sleep apnea, COPD, neg recent URI, former smoker   Pulmonary exam normal breath sounds clear to auscultation       Cardiovascular Exercise Tolerance: Good hypertension, (-) angina +CHF  (-) Past MI and (-) Cardiac Stents + dysrhythmias Atrial Fibrillation (-) Valvular Problems/Murmurs Rhythm:regular Rate:Normal     Neuro/Psych  PSYCHIATRIC DISORDERS Anxiety Depression    negative neurological ROS     GI/Hepatic Neg liver ROS,GERD  ,,  Endo/Other  neg diabetesHypothyroidism    Renal/GU CRFRenal disease  negative genitourinary   Musculoskeletal   Abdominal   Peds  Hematology negative hematology ROS (+)   Anesthesia Other Findings Past Medical History: No date: B12 deficiency No date: Cardiomyopathy [facility])     Comment:  a. 09/2022 Echo: EF 35-40%; b. 09/2022 MV: apical defect               w/ mild peri-infarct ischemia. EF 43%. No date: Chronic HFrEF (heart failure with reduced ejection fraction)  ([place])     Comment:  a. 01/2020 Echo: EF 30-35%; b. 07/2020 Echo: EF 50-55%;               c. 06/2022 Echo: EF 50-55%; d. 09/2022 Echo: EF 35-40%,               globh HK, mod reduced RV fxn, sev BAE, mod MR. No date: CKD (chronic kidney disease), stage IV (HCC) No date: COPD (chronic obstructive pulmonary disease) (HCC) No date: Depression No date: Endometriosis No date:  Hypertension No date: Mixed hyperlipidemia No date: Moderate mitral regurgitation No date: Normocytic anemia No date: PAF (paroxysmal atrial fibrillation) (HCC)     Comment:  a. CHA2DS2VASc = 4-->eliquis 2.5 bid. No date: Paroxymsal Atrial flutter (HCC) No date: Tobacco abuse   Reproductive/Obstetrics negative OB ROS                             Anesthesia Physical Anesthesia Plan  ASA: 4  Anesthesia Plan: General   Post-op Pain Management:    Induction: Intravenous  PONV Risk Score and Plan: 3 and Propofol infusion and TIVA  Airway Management Planned: Natural Airway and Nasal Cannula  Additional Equipment:   Intra-op Plan:   Post-operative Plan:   Informed Consent: I have reviewed the patients History and Physical, chart, labs and discussed the procedure including the risks, benefits and alternatives for the proposed anesthesia with the patient or authorized representative who has indicated his/her understanding and acceptance.     Dental Advisory Given  Plan Discussed with: Anesthesiologist, CRNA and Surgeon  Anesthesia Plan Comments:        Anesthesia Quick Evaluation

## 2022-11-23 NOTE — Progress Notes (Signed)
PROGRESS NOTE    Dawn Sherman  [id number]  DOB: 02/05/1956  DOA: 11/12/2022 PCP: [name], MD Outpatient Specialists:   Hospital course:  [name] is a 67 year old female with history of heart failure preserved ejection fraction, paroxysmal atrial fibrillation on low-dose Eliquis, history of subarachnoid hemorrhage secondary to mechanical fall, CKD stage IV, COPD, hypertension, who presents to emergency department from cardiology outpatient clinic for chief concerns of symptomatic anemia.   Vitals in the ED showed temperature of 97.8, respiration rate 18, heart rate 66, blood pressure 116/66, SpO2 of 97% on room air.   Serum sodium is 134, potassium 5.2, chloride 99, bicarb 25, BUN 40, serum creatinine of 2.71, nonfasting blood glucose 131, WBC 8.1, hemoglobin 7.7, platelets of 308.  eGFR 19.  BNP elevated at 3247.   High sensitive troponin has been ordered and is in process.   ED treatment: None   3/20: Vital stable on 4 L of oxygen this morning.  Apparently patient developed shortness of breath with flushing and muscle aches while getting blood transfusion which was discontinued at that time UA and transfusion reaction labs were negative.  Chest x-ray with pulmonary vascular congestion s/p IV Lasix and Benadryl.  CBC this morning with hemoglobin of 8.  Anemia panel with iron deficiency.  IV iron supplement ordered. BMP with slight worsening of creatinine to 2.96.  And potassium of 5.3.  Nephrology was also consulted.   3/21: Vitals and labs seems stable.  Remained on 3 L of oxygen.  Cardiology was also consulted and patient will be going for right heart catheterization tomorrow.   3/22: Vital stable, renal function continued to get worse slowly, persistent hyperkalemia with potassium at 5.4 despite getting Veltassa - dose was increased today. Echocardiogram with worsening of EF and right heart catheterization today with significantly elevated pressures.  Patient  was started on milrinone and Lasix infusion by cardiology.   3/23: Vital stable, remained on 3 L of oxygen.  Hemoglobin improved to 10, slight worsening of renal function with creatinine at 2.91, UOP recorded 2350, patient remains on milrinone and Lasix infusion.   3/24: Hemodynamically stable.  Worsening renal function, UOP recorded of more than 5 L in 24 hours, patient also received metolazone.  CVP or 10.  After discussing with cardiology and nephrology we are holding Lasix infusion today.  A-fib with RVR today heart rate cardiology switched to p.o. amiodarone with IV bolus and infusion.   3/25: Vital stable.  Converted back to sinus rhythm.  Creatinine at 3.19, nephrology decided to continue holding Lasix for another day.  Milrinone dose was decreased today.  Will remain on amiodarone infusion with a plan to convert to p.o. tomorrow.   3/26: Hemodynamically stable.  Renal functions with slow improvement, good UOP.  Nephrology restarted [place].  Remained on amiodarone and milrinone infusions.  Most likely will be converted to p.o. amiodarone today.   3/27: continues to improve. Off milronone. Coreg increased. Cardiology tentatively planning on left heart cath   3/29: stable, awaiting endoluminal eval tomorrow  3/30: EGD was WNL.  Colonoscopy not done due to solid stool.  Plan for colonoscopy tomorrow.     Subjective:  Patient's main concern is having to drink a gallon of GoLytely.  Notes that she only got it at 7:00 last night and could not drink the whole gallon.   Objective: Vitals:   11/23/22 0915 11/23/22 0925 11/23/22 0935 11/23/22 1140  BP: (!) 112/58 133/75 139/75 139/77  Pulse: 69  71 74 73  Resp: 19 17 16 18   Temp: (!) 97 F (36.1 C)   97.9 F (36.6 C)  TempSrc: Temporal   Oral  SpO2: 92% 97% 97% 98%  Weight:      Height:        Intake/Output Summary (Last 24 hours) at 11/23/2022 1500 Last data filed at 11/23/2022 1027 Gross per 24 hour  Intake 880 ml  Output --   Net 880 ml   Filed Weights   11/20/22 0500 11/22/22 0414 11/23/22 0427  Weight: 57.2 kg 54.9 kg 57.7 kg     Exam:  General: Reasonably well-appearing female sitting up in bed in NAD Eyes: sclera anicteric, conjuctiva mild injection bilaterally CVS: S1-S2, regular  Respiratory:  decreased air entry bilaterally secondary to decreased inspiratory effort, rales at bases  GI: NABS, soft, NT  LE: Warm and well-perfused Neuro: A/O x 3,  grossly nonfocal.  Psych: patient is logical and coherent, judgement and insight appear normal, mood and affect appropriate to situation.  Data Reviewed:  Basic Metabolic Panel: Recent Labs  Lab 11/17/22 0421 11/18/22 0429 11/19/22 0452 11/20/22 [id number] 11/21/22 1118 11/22/22 0654 11/23/22 0208 11/23/22 1143  NA 133* 133* 132* 135 134* 134* 132*  --   K 4.1 4.3 4.2 4.7 4.5 4.8 5.3* 5.8*  CL 96* 94* 98 100 98 100 100  --   CO2 27 27 26 27 25 26 23   --   GLUCOSE 103* 91 160* 109* 207* 97 87  --   BUN 70* 86* 76* 73* 64* 67* 61*  --   CREATININE 3.23* 3.19* 2.91* 2.82* 2.66* 2.76* 2.53*  --   CALCIUM 8.9 8.8* 8.5* 8.4* 8.7* 8.3* 8.4*  --   MG 2.3 2.2 2.2 2.1  --   --   --   --     CBC: Recent Labs  Lab 11/22/22 0654 11/23/22 0208  WBC 8.6 6.4  HGB 9.1* 8.3*  HCT 29.5* 27.4*  MCV 84.3 85.4  PLT 268 214     Scheduled Meds:  amiodarone  400 mg Oral BID   atorvastatin  40 mg Oral Daily   buPROPion  150 mg Oral Daily   calcium carbonate  1 tablet Oral TID WC   carvedilol  6.25 mg Oral BID WC   dapagliflozin propanediol  10 mg Oral Daily   fluticasone furoate-vilanterol  1 puff Inhalation Daily   And   umeclidinium bromide  1 puff Inhalation Daily   isosorbide-hydrALAZINE  1 tablet Oral TID   loratadine  10 mg Oral Daily   pantoprazole  40 mg Oral Daily   sodium chloride flush  10-40 mL Intracatheter Q12H   sodium chloride flush  3 mL Intravenous Q12H   torsemide  40 mg Oral Daily   venlafaxine XR  150 mg Oral Q breakfast    Continuous Infusions:  sodium chloride     sodium chloride Stopped (11/23/22 0913)   heparin 950 Units/hr (11/23/22 1423)     Assessment & Plan:   Symptomatic anemia Workup reveals IDA and some ACD EGD this morning was negative Plan for colonoscopy with hopefully a good prep tomorrow Try to get 1 unit PRBC however developed decompensated heart failure requiring Lasix so this was stopped.  HFrEF and HFpEF HTN Dyslipidemia Is diuresed well and is followed very closely by cardiology At present she is maintaining her fluid status on torsemide and carvedilol Patient is recently dropped her EF for unclear reasons Cardiology would like to do  an LHC if possible given her decreased GFR.  CKD 4 Creatinine has improved and is now back to baseline Cardiology would like a GFR greater than 30 prior to [facility]  Nephrology is following  Atrial fibrillation Patient was started on amiodarone drip and converted back to NSR Has been transitioned over to p.o. prednisone On heparin drip until Eliquis can be restarted on discharge  GERD Continue PPI    DVT prophylaxis: Heparin drip Code Status: Full     Studies: No results found.  Principal Problem:   Acute on chronic combined systolic and diastolic CHF (congestive heart failure) (HCC) Active Problems:   Acute hypoxemic respiratory failure (HCC)   Symptomatic anemia   Persistent atrial fibrillation (HCC)   CKD (chronic kidney disease), stage IV (HCC)   Hyperkalemia   HLD (hyperlipidemia)   Tobacco use   Essential hypertension   Depression, major, single episode, mild (HCC)   GERD without esophagitis   Dyspnea   Hypoxia   Dilated cardiomyopathy ([place])   Mitral valve insufficiency     Dawn Sherman, [facility]  If 7PM-7AM, please contact night-coverage [email or web address]   LOS: 11 days

## 2022-11-23 NOTE — Care Plan (Signed)
EGD unremarkable. Colonoscopy showed solid stool so procedure aborted. Will keep on clears today, order more prep, and retry tomorrow.  - repeat colonoscopy tomorrow - hold heparin drip at 3:30 am - NPO at midnight except prep, should stop prep at 5:30 am  Raylene Miyamoto MD, MPH [place]

## 2022-11-23 NOTE — Op Note (Signed)
[facility] Patient Name: Dawn Sherman Procedure Date: 11/23/2022 8:25 AM MRN: [id number] Account #: [id number] Date of Birth: 1955-09-09 Admit Type: Inpatient Age: 67 Room: [facility] ENDO ROOM 4 Gender: Female Note Status: Supervisor Override Instrument Name: Upper Endoscope [id number] Procedure:             Upper GI endoscopy Indications:           Iron deficiency anemia Providers:             Andrey Farmer MD, MD Referring MD:          [name], MD (Referring MD) Medicines:             Monitored Anesthesia Care Complications:         No immediate complications. Procedure:             Pre-Anesthesia Assessment:                        - Prior to the procedure, a History and Physical was                         performed, and patient medications and allergies were                         reviewed. The patient is competent. The risks and                         benefits of the procedure and the sedation options and                         risks were discussed with the patient. All questions                         were answered and informed consent was obtained.                         Patient identification and proposed procedure were                         verified by the physician, the nurse, the                         anesthesiologist, the anesthetist and the technician                         in the endoscopy suite. Mental Status Examination:                         alert and oriented. Airway Examination: normal                         oropharyngeal airway and neck mobility. Respiratory                         Examination: clear to auscultation. CV Examination:                         normal. Prophylactic Antibiotics: The patient does not  require prophylactic antibiotics. Prior                         Anticoagulants: The patient has taken heparin, last                         dose was day of procedure. ASA Grade  Assessment: IV -                         A patient with severe systemic disease that is a                         constant threat to life. After reviewing the risks and                         benefits, the patient was deemed in satisfactory                         condition to undergo the procedure. The anesthesia                         plan was to use monitored anesthesia care (MAC).                         Immediately prior to administration of medications,                         the patient was re-assessed for adequacy to receive                         sedatives. The heart rate, respiratory rate, oxygen                         saturations, blood pressure, adequacy of pulmonary                         ventilation, and response to care were monitored                         throughout the procedure. The physical status of the                         patient was re-assessed after the procedure.                        After obtaining informed consent, the endoscope was                         passed under direct vision. Throughout the procedure,                         the patient's blood pressure, pulse, and oxygen                         saturations were monitored continuously. The Endoscope                         was introduced through the mouth, and advanced to  the                         second part of duodenum. The upper GI endoscopy was                         accomplished without difficulty. The patient tolerated                         the procedure well. Findings:      The examined esophagus was normal.      The entire examined stomach was normal.      The examined duodenum was normal. Impression:            - Normal esophagus.                        - Normal stomach.                        - Normal examined duodenum.                        - No specimens collected. Recommendation:        - Perform a colonoscopy today. Procedure Code(s):     --- Professional ---                         [phone], Esophagogastroduodenoscopy, flexible,                         transoral; diagnostic, including collection of                         specimen(s) by brushing or washing, when performed                         (separate procedure) Diagnosis Code(s):     --- Professional ---                        D50.9, Iron deficiency anemia, unspecified CPT copyright 2022 American Medical Association. All rights reserved. The codes documented in this report are preliminary and upon coder review may  be revised to meet current compliance requirements. Andrey Farmer MD, MD 11/23/2022 9:13:21 AM Number of Addenda: 0 Note Initiated On: 11/23/2022 8:25 AM Estimated Blood Loss:  Estimated blood loss: none.      [facility]

## 2022-11-23 NOTE — Progress Notes (Signed)
Mobility Specialist - Progress Note   11/23/22 0817  Mobility  Activity Off unit   Pt off unit for procedure during attempt. Will re attempt at another date/time.   Gretchen Short  Mobility Specialist  11/23/22 8:17 AM

## 2022-11-23 NOTE — Progress Notes (Signed)
Rounding Note    Patient Name: Dawn Sherman Date of Encounter: 11/23/2022  [employer] Cardiologist: Ida Rogue, MD   Subjective   No chest pain, dyspnea, palpitations, hematochezia or melena. Documented UOP 3.2 L for the admission. Supplemental oxygen weaned at 1 L. Eliquis held, on heparin gtt for planned endoscopy today. Renal function slightly improved. Potassium 5.3. Hgb 9.1-->8.3.   Inpatient Medications    Scheduled Meds:  amiodarone  400 mg Oral BID   atorvastatin  40 mg Oral Daily   buPROPion  150 mg Oral Daily   calcium carbonate  1 tablet Oral TID WC   carvedilol  6.25 mg Oral BID WC   dapagliflozin propanediol  10 mg Oral Daily   fluticasone furoate-vilanterol  1 puff Inhalation Daily   And   umeclidinium bromide  1 puff Inhalation Daily   isosorbide-hydrALAZINE  1 tablet Oral TID   loratadine  10 mg Oral Daily   pantoprazole  40 mg Oral Daily   sodium chloride flush  10-40 mL Intracatheter Q12H   sodium chloride flush  3 mL Intravenous Q12H   torsemide  40 mg Oral Daily   venlafaxine XR  150 mg Oral Q breakfast   Continuous Infusions:  sodium chloride     sodium chloride 20 mL/hr at 11/22/22 1749   heparin 1,100 Units/hr (11/23/22 0325)   PRN Meds: sodium chloride, albuterol, clonazepam, senna-docusate, sodium chloride flush   Vital Signs    Vitals:   11/22/22 1617 11/22/22 1949 11/22/22 2332 11/23/22 0427  BP: 135/82 135/88 121/71 122/68  Pulse: 75 71 70 70  Resp: 17 18 18 20   Temp: 98.2 F (36.8 C) (!) 97.3 F (36.3 C) 97.6 F (36.4 C) (!) 97.4 F (36.3 C)  TempSrc: Oral     SpO2: 98% 99% 97% 100%  Weight:    57.7 kg  Height:        Intake/Output Summary (Last 24 hours) at 11/23/2022 0749 Last data filed at 11/22/2022 1927 Gross per 24 hour  Intake 905.11 ml  Output --  Net 905.11 ml       11/23/2022    4:27 AM 11/22/2022    4:14 AM 11/20/2022    5:00 AM  Last 3 Weights  Weight (lbs) 127 lb 3.3 oz 121 lb 0.5 oz 126  lb 1.7 oz  Weight (kg) 57.7 kg 54.9 kg 57.2 kg      Telemetry    Sinus with first degree AVB rates 60's-80s bpm - Personally Reviewed  ECG    No new tracings - Personally Reviewed  Physical Exam   GEN: No acute distress.   Neck: No JVD Cardiac: RRR, no murmurs, rubs, or gallops.  Respiratory: Clear to auscultation GI: Soft, nontender, non-distended  MS: No edema; No deformity. Neuro:  Nonfocal  Psych: Normal affect   Labs    High Sensitivity Troponin:   Recent Labs  Lab 11/12/22 1357 11/12/22 1858  TROPONINIHS 10 9      Chemistry Recent Labs  Lab 11/18/22 0429 11/19/22 0452 11/20/22 0613 11/21/22 1118 11/22/22 0654 11/23/22 0208  NA 133* 132* 135 134* 134* 132*  K 4.3 4.2 4.7 4.5 4.8 5.3*  CL 94* 98 100 98 100 100  CO2 27 26 27 25 26 23   GLUCOSE 91 160* 109* 207* 97 87  BUN 86* 76* 73* 64* 67* 61*  CREATININE 3.19* 2.91* 2.82* 2.66* 2.76* 2.53*  CALCIUM 8.8* 8.5* 8.4* 8.7* 8.3* 8.4*  MG 2.2 2.2 2.1  --   --   --  GFRNONAA 15* 17* 18* 19* 18* 20*  ANIONGAP 12 8 8 11 8 9      Lipids No results for input(s): "CHOL", "TRIG", "HDL", "LABVLDL", "LDLCALC", "CHOLHDL" in the last 168 hours.  Hematology Recent Labs  Lab 11/22/22 0654 11/23/22 0208  WBC 8.6 6.4  RBC 3.50* 3.21*  HGB 9.1* 8.3*  HCT 29.5* 27.4*  MCV 84.3 85.4  MCH 26.0 25.9*  MCHC 30.8 30.3  RDW 21.8* 22.0*  PLT 268 214    Thyroid No results for input(s): "TSH", "FREET4" in the last 168 hours.  BNP No results for input(s): "BNP", "PROBNP" in the last 168 hours.   DDimer No results for input(s): "DDIMER" in the last 168 hours.   Radiology    DG Chest 2 View  Result Date: 11/20/2022 IMPRESSION: 1. Interval decreased volume of right pleural effusion with improved aeration to the right lung base. 2. Mild edema. Electronically Signed   By: Kerby Moors M.D.   On: 11/20/2022 15:03    Cardiac Studies  TTE 11/14/22 1. Left ventricular ejection fraction, by estimation, is 25 to 30%.  The  left ventricle has severely decreased function. The left ventricle  demonstrates global hypokinesis. There is moderate left ventricular  hypertrophy. Left ventricular diastolic  parameters are indeterminate.   2. Right ventricular systolic function is moderately reduced. The right  ventricular size is normal. There is moderately elevated pulmonary artery  systolic pressure. The estimated right ventricular systolic pressure is  [id number] mmHg.   3. Left atrial size was severely dilated.   4. A small pericardial effusion is present.   5. The mitral valve is normal in structure. Severe mitral valve  regurgitation. No evidence of mitral stenosis.   6. Tricuspid valve regurgitation is moderate to severe.   7. The aortic valve is normal in structure. Aortic valve regurgitation is  not visualized. No aortic stenosis is present.   8. The inferior vena cava is normal in size with greater than 50%  respiratory variability, suggesting right atrial pressure of 3 mmHg.    [place] 11/15/2022 Right heart catheterization pressures RA (mean): 21 mmHg RV (S/EDP): 62/24 mmHg PA (S/D, mean): 63/38 (46) mmHg PCWP (mean): 34 mmHg  Ao sat: 91% PA sat: 30%  Fick CO: 3.4 L/min Fick CI: 2.0 L/min/m^2  PVR: 3.5 Wood units PAPi: 1.2    Patient Profile     67 y.o. female with a past medical history of current tobacco use, COPD, paroxysmal atrial fibrillation/atrial flutter, moderate MR, chronic kidney disease stage IV, cardiomyopathy with an ejection fraction of 30-35% in the setting of atrial fibrillation/flutter on apixaban who presents to the hospital via the cardiology clinic for worsening shortness of breath and anemia.  Assessment & Plan    Acute on chronic combined systolic and diastolic congestive heart failure -Severely reduced function on echo with LVEF 25-30% -Recent stress testing revealed a fixed defect without significant ischemia -RHC on 11/15/22 with RA 21, PA 63/38, PCWP 34, C.O. 3.4, C.I  2.0 -She was treated with milrinone which was discontinued 3/26, she has continued to improve -She appears to be euvolemic on torsemide 40 mg once daily and Farxiga 10 mg daily -Continue titrated dose of carvedilol to 6.25 mg twice daily -No ACE inhibitor, ARB, ARNI or MRA due to acute on chronic kidney disease -For now, continue newly started BiDil  -If renal function improves, this can be switched to [facility] in the outpatient setting -Given progressive drop in her ejection fraction, we should consider left  heart catheterization with low contrast at some point, hopefully this can be done once her GFR is above 30, timing TBD, perhaps prior to discharge early next week if she remains admitted -As outpatient, would look to have her establish with the advanced heart failure team  Acute on chronic anemia -Hgb 8.3 today -No active signs of bleeding -Iron given 11/13/22 -Has received blood this admission -GI planning for EGD and colonoscopy 3/30  Paroxysmal atrial fibrillation/flutter -A-fib with RVR was likely the culprit for decompensated heart failure -She converted to sinus rhythm with IV amiodarone -She is now on oral amiodarone 400 mg twice daily which should be continued for one more day then decrease to 200 mg twice daily for a week then 200 mg once daily thereafter -Eliquis on hold, on heparin gtt, for washout for planned EGD and colonoscopy 3/30  Chronic kidney disease stage IV -Followed by nephrology -Renal function stable  Long history of tobacco use with COPD -She reports that she quit smoking last month -Continued on oxygen therapy  6. Hyperkalemia: -Not on ACEiARB/ARNI/MRA -Recheck    CHA2DS2-VASc Score = 5   This indicates a 7.2% annual risk of stroke. The patient's score is based upon: CHF History: 1 HTN History: 1 Diabetes History: 0 Stroke History: 0 Vascular Disease History: 1 Age Score: 1 Gender Score: 1     For questions or updates, please contact  [place] Please consult [email or web address] for contact info under        Signed, Christell Faith, PA-C  11/23/2022, 7:49 AM

## 2022-11-23 NOTE — Consult Note (Signed)
ANTICOAGULATION CONSULT NOTE - Follow Up Consult  Pharmacy Consult for heparin infusion Indication: atrial fibrillation  Allergies  Allergen Reactions   Sulfate Rash   Codeine Sulfate Nausea Only   Benicar [Olmesartan]     Talked with patient February 10, 2020, intolerance is unclear, tried several medications around that time and one of them gave her a rash but she is not clear which 1.   Amoxicillin Rash   Clindamycin/Lincomycin Rash   Entresto [Sacubitril-Valsartan] Other (See Comments)    hyperkalemia   Morphine And Related Rash   Penicillins Rash    Patient Measurements: Height: 5\' 5"  (165.1 cm) Weight: 57.7 kg (127 lb 3.3 oz) IBW/kg (Calculated) : 57 Heparin Dosing Weight: 54.9 kg  Vital Signs: Temp: 97.9 F (36.6 C) (03/30 1140) Temp Source: Oral (03/30 1140) BP: 139/77 (03/30 1140) Pulse Rate: 73 (03/30 1140)  Labs: Recent Labs    11/21/22 1118 11/22/22 0654 11/22/22 1605 11/23/22 0208 11/23/22 1143  HGB  --  9.1*  --  8.3*  --   HCT  --  29.5*  --  27.4*  --   PLT  --  268  --  214  --   APTT 36 35 33 36 142*  LABPROT 14.8  --   --   --   --   INR 1.2  --   --   --   --   HEPARINUNFRC  --  >1.10*  --  0.90*  --   CREATININE 2.66* 2.76*  --  2.53*  --      Estimated Creatinine Clearance: 19.4 mL/min (A) (by C-G formula based on SCr of 2.53 mg/dL (H)).   Medications:  Medications Prior to Admission  Medication Sig Dispense Refill Last Dose   acetaminophen (TYLENOL) 650 MG CR tablet Take 650 mg by mouth every 8 (eight) hours as needed for pain or fever.   Past Week   albuterol (VENTOLIN HFA) 108 (90 Base) MCG/ACT inhaler Inhale 2 puffs into the lungs every 6 (six) hours as needed for wheezing or shortness of breath. 8 g 2 Past Week   amiodarone (PACERONE) 200 MG tablet Take 1 tablet (200 mg total) by mouth daily. 30 tablet 1 11/12/2022   apixaban (ELIQUIS) 2.5 MG TABS tablet Take 1 tablet (2.5 mg total) by mouth 2 (two) times daily. 60 tablet 6 11/12/2022  at 0600   atorvastatin (LIPITOR) 40 MG tablet Take 1 tablet (40 mg total) by mouth daily. 90 tablet 1 11/12/2022   buPROPion (WELLBUTRIN XL) 150 MG 24 hr tablet Take 1 tablet (150 mg total) by mouth daily. 90 tablet 1 11/12/2022   calcium carbonate (OS-CAL - DOSED IN MG OF ELEMENTAL CALCIUM) 1250 (500 Ca) MG tablet Take 1 tablet (500 mg of elemental calcium total) by mouth 3 (three) times daily with meals. 60 tablet 1 11/12/2022   FARXIGA 10 MG TABS tablet Take 10 mg by mouth daily.   11/12/2022   Fluticasone-Umeclidin-Vilant (TRELEGY ELLIPTA) 100-62.5-25 MCG/ACT AEPB Inhale 1 puff into the lungs daily. 3 each 3 11/12/2022   loratadine (CLARITIN) 10 MG tablet Take 10 mg by mouth daily.   11/12/2022   metoprolol succinate (TOPROL XL) 25 MG 24 hr tablet Take 1 tablet (25 mg total) by mouth daily. 30 tablet 1 11/12/2022   omeprazole (PRILOSEC) 20 MG capsule Take 20 mg by mouth daily.   11/12/2022   torsemide (DEMADEX) 20 MG tablet Take 2 tablets (40 mg) by mouth once daily. You may take an extra  2 tablets (40 mg) in the afternoon as needed for leg swelling 270 tablet 3 11/12/2022   venlafaxine XR (EFFEXOR-XR) 150 MG 24 hr capsule TAKE ONE CAPSULE BY MOUTH EVERY DAY 90 capsule 1 11/12/2022   Scheduled:   amiodarone  400 mg Oral BID   atorvastatin  40 mg Oral Daily   buPROPion  150 mg Oral Daily   calcium carbonate  1 tablet Oral TID WC   carvedilol  6.25 mg Oral BID WC   dapagliflozin propanediol  10 mg Oral Daily   fluticasone furoate-vilanterol  1 puff Inhalation Daily   And   umeclidinium bromide  1 puff Inhalation Daily   isosorbide-hydrALAZINE  1 tablet Oral TID   loratadine  10 mg Oral Daily   pantoprazole  40 mg Oral Daily   sodium chloride flush  10-40 mL Intracatheter Q12H   sodium chloride flush  3 mL Intravenous Q12H   torsemide  40 mg Oral Daily   venlafaxine XR  150 mg Oral Q breakfast   Infusions:   sodium chloride     sodium chloride Stopped (11/23/22 0913)   heparin 1,100 Units/hr  (11/23/22 0325)   PRN: sodium chloride, albuterol, clonazepam, senna-docusate, sodium chloride flush Anti-infectives (From admission, onward)    None       Assessment: 39 YOF admitted for CHF presenting to ED with CC of symptomatic anemia. PMH includes HFpEF, paroxysmal Afib, subarachnoid hemorrhage secondary to mechanical fall, CKD stage 4, HLD, GERD, HTN, COPD. Patient takes apixaban 2.5 mg BID for Afib. Per provider, the plan is to stop apixaban and start heparin infusion. The last dose of apixaban was 3/28 at 0914. CHADSVASc 4.  Pharmacy is being consulted for initiation of heparin infusion for afib while holding apixaban.   Heparin level is still falsely elevated due to prior anticoagulation with apixaban. Using aPTT monitoring until aPTT and heparin levels correlate.   Heparin monitoring: 0329 0654 aPTT 35s, subtherapeutic  0329 1605 aPTT 33s, subtherapeutic increase heparin infusion to 900 units/hr. 0330 0208 aPTT 36s,  HL 0.9  inc to 1100 u/hr 03/30 1143 aPTT 142  SUPRAtherapeutic, hold drip x 1 hour and decrease to 950 u/hr     Goal of Therapy:  Heparin level 0.3-0.5 units/ml when heparin level and aPTT correlate.  aPTT 66-85 seconds Monitor platelets by anticoagulation protocol: Yes   Plan:  Per consult: Do not bolus and use lower therapeutic range for monitoring due to possible GI bleed.   -03/30 1143 aPTT 142  SUPRAtherapeutic, hold drip x 1 hour and decrease to 950 u/hr  - aPTT and HL not correlating ( Eliquis PTA)  - Will recheck aPTT 6 hrs after rate change (little early due to elevation). - Heparin drip to be held 3/31 at 0330 for colonscopy. - Switch to heparin level monitoring once aPTT and heparin level correlate.     Noralee Space, PharmD Clinical Pharmacist   11/23/2022,12:34 PM

## 2022-11-23 NOTE — Consult Note (Addendum)
ANTICOAGULATION CONSULT NOTE - Follow Up Consult  Pharmacy Consult for heparin infusion Indication: atrial fibrillation  Allergies  Allergen Reactions   Sulfate Rash   Codeine Sulfate Nausea Only   Benicar [Olmesartan]     Talked with patient February 10, 2020, intolerance is unclear, tried several medications around that time and one of them gave her a rash but she is not clear which 1.   Amoxicillin Rash   Clindamycin/Lincomycin Rash   Entresto [Sacubitril-Valsartan] Other (See Comments)    hyperkalemia   Morphine And Related Rash   Penicillins Rash    Patient Measurements: Height: 5\' 5"  (165.1 cm) Weight: 54.9 kg (121 lb 0.5 oz) IBW/kg (Calculated) : 57 Heparin Dosing Weight: 54.9 kg  Vital Signs: Temp: 97.6 F (36.4 C) (03/29 2332) Temp Source: Oral (03/29 1617) BP: 121/71 (03/29 2332) Pulse Rate: 70 (03/29 2332)  Labs: Recent Labs    11/21/22 1118 11/22/22 0654 11/22/22 1605 11/23/22 0208  HGB  --  9.1*  --  8.3*  HCT  --  29.5*  --  27.4*  PLT  --  268  --  214  APTT 36 35 33 36  LABPROT 14.8  --   --   --   INR 1.2  --   --   --   HEPARINUNFRC  --  >1.10*  --  0.90*  CREATININE 2.66* 2.76*  --  2.53*     Estimated Creatinine Clearance: 18.7 mL/min (A) (by C-G formula based on SCr of 2.53 mg/dL (H)).   Medications:  Medications Prior to Admission  Medication Sig Dispense Refill Last Dose   acetaminophen (TYLENOL) 650 MG CR tablet Take 650 mg by mouth every 8 (eight) hours as needed for pain or fever.   Past Week   albuterol (VENTOLIN HFA) 108 (90 Base) MCG/ACT inhaler Inhale 2 puffs into the lungs every 6 (six) hours as needed for wheezing or shortness of breath. 8 g 2 Past Week   amiodarone (PACERONE) 200 MG tablet Take 1 tablet (200 mg total) by mouth daily. 30 tablet 1 11/12/2022   apixaban (ELIQUIS) 2.5 MG TABS tablet Take 1 tablet (2.5 mg total) by mouth 2 (two) times daily. 60 tablet 6 11/12/2022 at 0600   atorvastatin (LIPITOR) 40 MG tablet Take 1  tablet (40 mg total) by mouth daily. 90 tablet 1 11/12/2022   buPROPion (WELLBUTRIN XL) 150 MG 24 hr tablet Take 1 tablet (150 mg total) by mouth daily. 90 tablet 1 11/12/2022   calcium carbonate (OS-CAL - DOSED IN MG OF ELEMENTAL CALCIUM) 1250 (500 Ca) MG tablet Take 1 tablet (500 mg of elemental calcium total) by mouth 3 (three) times daily with meals. 60 tablet 1 11/12/2022   FARXIGA 10 MG TABS tablet Take 10 mg by mouth daily.   11/12/2022   Fluticasone-Umeclidin-Vilant (TRELEGY ELLIPTA) 100-62.5-25 MCG/ACT AEPB Inhale 1 puff into the lungs daily. 3 each 3 11/12/2022   loratadine (CLARITIN) 10 MG tablet Take 10 mg by mouth daily.   11/12/2022   metoprolol succinate (TOPROL XL) 25 MG 24 hr tablet Take 1 tablet (25 mg total) by mouth daily. 30 tablet 1 11/12/2022   omeprazole (PRILOSEC) 20 MG capsule Take 20 mg by mouth daily.   11/12/2022   torsemide (DEMADEX) 20 MG tablet Take 2 tablets (40 mg) by mouth once daily. You may take an extra 2 tablets (40 mg) in the afternoon as needed for leg swelling 270 tablet 3 11/12/2022   venlafaxine XR (EFFEXOR-XR) 150 MG 24  hr capsule TAKE ONE CAPSULE BY MOUTH EVERY DAY 90 capsule 1 11/12/2022   Scheduled:   amiodarone  400 mg Oral BID   atorvastatin  40 mg Oral Daily   buPROPion  150 mg Oral Daily   calcium carbonate  1 tablet Oral TID WC   carvedilol  6.25 mg Oral BID WC   dapagliflozin propanediol  10 mg Oral Daily   fluticasone furoate-vilanterol  1 puff Inhalation Daily   And   umeclidinium bromide  1 puff Inhalation Daily   heparin  1,650 Units Intravenous Once   isosorbide-hydrALAZINE  1 tablet Oral TID   loratadine  10 mg Oral Daily   pantoprazole  40 mg Oral Daily   sodium chloride flush  10-40 mL Intracatheter Q12H   sodium chloride flush  3 mL Intravenous Q12H   torsemide  40 mg Oral Daily   venlafaxine XR  150 mg Oral Q breakfast   Infusions:   sodium chloride     sodium chloride 20 mL/hr at 11/22/22 1749   heparin 1,000 Units/hr (11/22/22  2223)   PRN: sodium chloride, albuterol, clonazepam, senna-docusate, sodium chloride flush Anti-infectives (From admission, onward)    None       Assessment: 22 YOF admitted for CHF presenting to ED with CC of symptomatic anemia. PMH includes HFpEF, paroxysmal Afib, subarachnoid hemorrhage secondary to mechanical fall, CKD stage 4, HLD, GERD, HTN, COPD. Patient takes apixaban 2.5 mg BID for Afib. Per provider, the plan is to stop apixaban and start heparin infusion. The last dose of apixaban was 3/28 at 0914. CHADSVASc 4.  Pharmacy is being consulted for initiation of heparin infusion for afib while holding apixaban.   Heparin level is still falsely elevated due to prior anticoagulation with apixaban. Using aPTT monitoring until aPTT and heparin levels correlate.   Heparin monitoring: 0329 0654 aPTT 35s, subtherapeutic  0329 1605 aPTT 33s, subtherapeutic  0330 0208 aPTT 36s,  HL 0.9  Based on subtherapeutic aPTT of 35s (goal 66-85s), increase heparin infusion to 900 units/hr. Do not bolus and use lower therapeutic range for monitoring due to possible GI bleed.   Goal of Therapy:  Heparin level 0.3-0.5 units/ml when heparin level and aPTT correlate.  aPTT 66-85 seconds Monitor platelets by anticoagulation protocol: Yes   Plan:  3/30 @ 0208:  aPTT = 36s,  HL = 0.9 - aPTT is SUBtherapeutic,  HL remains elevated from Eliquis PTA.  - Will increase drip rate to 1100 units/hr.  - Will recheck aPTT 8 hrs after rate change. - Will recheck HL on 3/31 with AM labs.  - Switch to heparin level monitoring once aPTT and heparin level correlate.     [place] D, PharmD Clinical Pharmacist   11/23/2022,2:35 AM

## 2022-11-23 NOTE — Anesthesia Postprocedure Evaluation (Signed)
Anesthesia Post Note  Patient: Dawn Sherman  Procedure(s) Performed: ESOPHAGOGASTRODUODENOSCOPY (EGD) WITH PROPOFOL COLONOSCOPY WITH PROPOFOL  Patient location during evaluation: Endoscopy Anesthesia Type: General Level of consciousness: awake and alert Pain management: pain level controlled Vital Signs Assessment: post-procedure vital signs reviewed and stable Respiratory status: spontaneous breathing, nonlabored ventilation, respiratory function stable and patient connected to nasal cannula oxygen Cardiovascular status: blood pressure returned to baseline and stable Postop Assessment: no apparent nausea or vomiting Anesthetic complications: no   No notable events documented.   Last Vitals:  Vitals:   11/23/22 0935 11/23/22 1140  BP: 139/75 139/77  Pulse: 74 73  Resp: 16 18  Temp:  36.6 C  SpO2: 97% 98%    Last Pain:  Vitals:   11/23/22 1140  TempSrc: Oral  PainSc:                  Martha Clan

## 2022-11-23 NOTE — Plan of Care (Signed)

## 2022-11-23 NOTE — Consult Note (Signed)
ANTICOAGULATION CONSULT NOTE - Follow Up Consult  Pharmacy Consult for heparin infusion Indication: atrial fibrillation  Allergies  Allergen Reactions   Sulfate Rash   Codeine Sulfate Nausea Only   Benicar [Olmesartan]     Talked with patient February 10, 2020, intolerance is unclear, tried several medications around that time and one of them gave her a rash but she is not clear which 1.   Amoxicillin Rash   Clindamycin/Lincomycin Rash   Entresto [Sacubitril-Valsartan] Other (See Comments)    hyperkalemia   Morphine And Related Rash   Penicillins Rash    Patient Measurements: Height: 5\' 5"  (165.1 cm) Weight: 57.7 kg (127 lb 3.3 oz) IBW/kg (Calculated) : 57 Heparin Dosing Weight: 54.9 kg  Vital Signs: Temp: 97.8 F (36.6 C) (03/30 1947) Temp Source: Oral (03/30 1947) BP: 117/71 (03/30 1947) Pulse Rate: 62 (03/30 1947)  Labs: Recent Labs    11/21/22 1118 11/22/22 0654 11/22/22 1605 11/23/22 0208 11/23/22 1143 11/23/22 2009  HGB  --  9.1*  --  8.3*  --   --   HCT  --  29.5*  --  27.4*  --   --   PLT  --  268  --  214  --   --   APTT 36 35   < > 36 142* 117*  LABPROT 14.8  --   --   --   --   --   INR 1.2  --   --   --   --   --   HEPARINUNFRC  --  >1.10*  --  0.90*  --   --   CREATININE 2.66* 2.76*  --  2.53*  --   --    < > = values in this interval not displayed.     Estimated Creatinine Clearance: 19.4 mL/min (A) (by C-G formula based on SCr of 2.53 mg/dL (H)).   Medications:  Medications Prior to Admission  Medication Sig Dispense Refill Last Dose   acetaminophen (TYLENOL) 650 MG CR tablet Take 650 mg by mouth every 8 (eight) hours as needed for pain or fever.   Past Week   albuterol (VENTOLIN HFA) 108 (90 Base) MCG/ACT inhaler Inhale 2 puffs into the lungs every 6 (six) hours as needed for wheezing or shortness of breath. 8 g 2 Past Week   amiodarone (PACERONE) 200 MG tablet Take 1 tablet (200 mg total) by mouth daily. 30 tablet 1 11/12/2022   apixaban  (ELIQUIS) 2.5 MG TABS tablet Take 1 tablet (2.5 mg total) by mouth 2 (two) times daily. 60 tablet 6 11/12/2022 at 0600   atorvastatin (LIPITOR) 40 MG tablet Take 1 tablet (40 mg total) by mouth daily. 90 tablet 1 11/12/2022   buPROPion (WELLBUTRIN XL) 150 MG 24 hr tablet Take 1 tablet (150 mg total) by mouth daily. 90 tablet 1 11/12/2022   calcium carbonate (OS-CAL - DOSED IN MG OF ELEMENTAL CALCIUM) 1250 (500 Ca) MG tablet Take 1 tablet (500 mg of elemental calcium total) by mouth 3 (three) times daily with meals. 60 tablet 1 11/12/2022   FARXIGA 10 MG TABS tablet Take 10 mg by mouth daily.   11/12/2022   Fluticasone-Umeclidin-Vilant (TRELEGY ELLIPTA) 100-62.5-25 MCG/ACT AEPB Inhale 1 puff into the lungs daily. 3 each 3 11/12/2022   loratadine (CLARITIN) 10 MG tablet Take 10 mg by mouth daily.   11/12/2022   metoprolol succinate (TOPROL XL) 25 MG 24 hr tablet Take 1 tablet (25 mg total) by mouth daily. 30 tablet 1  11/12/2022   omeprazole (PRILOSEC) 20 MG capsule Take 20 mg by mouth daily.   11/12/2022   torsemide (DEMADEX) 20 MG tablet Take 2 tablets (40 mg) by mouth once daily. You may take an extra 2 tablets (40 mg) in the afternoon as needed for leg swelling 270 tablet 3 11/12/2022   venlafaxine XR (EFFEXOR-XR) 150 MG 24 hr capsule TAKE ONE CAPSULE BY MOUTH EVERY DAY 90 capsule 1 11/12/2022   Scheduled:   amiodarone  400 mg Oral BID   atorvastatin  40 mg Oral Daily   buPROPion  150 mg Oral Daily   calcium carbonate  1 tablet Oral TID WC   carvedilol  6.25 mg Oral BID WC   dapagliflozin propanediol  10 mg Oral Daily   fluticasone furoate-vilanterol  1 puff Inhalation Daily   And   umeclidinium bromide  1 puff Inhalation Daily   isosorbide-hydrALAZINE  1 tablet Oral TID   loratadine  10 mg Oral Daily   pantoprazole  40 mg Oral Daily   sodium chloride flush  10-40 mL Intracatheter Q12H   sodium chloride flush  3 mL Intravenous Q12H   torsemide  40 mg Oral Daily   venlafaxine XR  150 mg Oral Q  breakfast   Infusions:   sodium chloride     sodium chloride Stopped (11/23/22 0913)   heparin 950 Units/hr (11/23/22 1423)   PRN: sodium chloride, albuterol, clonazepam, senna-docusate, sodium chloride flush Anti-infectives (From admission, onward)    None       Assessment: 5 YOF admitted for CHF presenting to ED with CC of symptomatic anemia. PMH includes HFpEF, paroxysmal Afib, subarachnoid hemorrhage secondary to mechanical fall, CKD stage 4, HLD, GERD, HTN, COPD. Patient takes apixaban 2.5 mg BID for Afib. Per provider, the plan is to stop apixaban and start heparin infusion. The last dose of apixaban was 3/28 at 0914. CHADSVASc 4.  Pharmacy is being consulted for initiation of heparin infusion for afib while holding apixaban.   Heparin level is still falsely elevated due to prior anticoagulation with apixaban. Using aPTT monitoring until aPTT and heparin levels correlate.   Heparin monitoring: 0329 0654 aPTT 35s, subtherapeutic  0329 1605 aPTT 33s, subtherapeutic increase heparin infusion to 900 units/hr. 0330 0208 aPTT 36s,  HL 0.9  inc to 1100 u/hr 03/30 1143 aPTT 142  SUPRAtherapeutic, hold drip x 1 hour and decrease to 950 u/hr  03/30 2009 aPTT 177, supratherapeutic @ 950 units/hr>>hold x 1 hour and reduce to 850 unit/hr   Goal of Therapy:  Heparin level 0.3-0.5 units/ml when heparin level and aPTT correlate.  aPTT 66-85 seconds Monitor platelets by anticoagulation protocol: Yes   Plan:  Per consult: Do not bolus and use lower therapeutic range for monitoring due to possible GI bleed.   -aPTT supratherapeutic, hold heparin drip x 1 hour, then reduce rate to 850 units/hr - aPTT and HL not correlating ( Eliquis PTA)  - Will recheck aPTT 6 hrs after rate change (little early due to elevation). - Heparin drip to be held 3/31 at 0330 for colonscopy. - Switch to heparin level monitoring once aPTT and heparin level correlate.     Lorin Picket, PharmD Clinical  Pharmacist   11/23/2022,9:08 PM

## 2022-11-24 ENCOUNTER — Inpatient Hospital Stay: Payer: Medicare Other | Admitting: Anesthesiology

## 2022-11-24 ENCOUNTER — Encounter: Payer: Self-pay | Admitting: Internal Medicine

## 2022-11-24 ENCOUNTER — Encounter
Admission: EM | Disposition: A | Discharge status: Home Health | Payer: Self-pay | Source: Home / Self Care | Attending: Internal Medicine

## 2022-11-24 DIAGNOSIS — I5043 Acute on chronic combined systolic (congestive) and diastolic (congestive) heart failure: Secondary | ICD-10-CM | POA: Diagnosis not present

## 2022-11-24 HISTORY — PX: COLONOSCOPY WITH PROPOFOL: SHX5780

## 2022-11-24 LAB — CBC
HCT: 27.9 % — ABNORMAL LOW (ref 36.0–46.0)
Hemoglobin: 8.4 g/dL — ABNORMAL LOW (ref 12.0–15.0)
MCH: 25.8 pg — ABNORMAL LOW (ref 26.0–34.0)
MCHC: 30.1 g/dL (ref 30.0–36.0)
MCV: 85.8 fL (ref 80.0–100.0)
Platelets: 226 10*3/uL (ref 150–400)
RBC: 3.25 MIL/uL — ABNORMAL LOW (ref 3.87–5.11)
RDW: 22.5 % — ABNORMAL HIGH (ref 11.5–15.5)
WBC: 6.6 10*3/uL (ref 4.0–10.5)
nRBC: 0 % (ref 0.0–0.2)

## 2022-11-24 LAB — BASIC METABOLIC PANEL
Anion gap: 7 (ref 5–15)
BUN: 46 mg/dL — ABNORMAL HIGH (ref 8–23)
CO2: 23 mmol/L (ref 22–32)
Calcium: 8.6 mg/dL — ABNORMAL LOW (ref 8.9–10.3)
Chloride: 104 mmol/L (ref 98–111)
Creatinine, Ser: 2.31 mg/dL — ABNORMAL HIGH (ref 0.44–1.00)
GFR, Estimated: 23 mL/min — ABNORMAL LOW (ref >60)
Glucose, Bld: 88 mg/dL (ref 70–99)
Potassium: 4.8 mmol/L (ref 3.5–5.1)
Sodium: 134 mmol/L — ABNORMAL LOW (ref 135–145)

## 2022-11-24 LAB — APTT
aPTT: 148 seconds — ABNORMAL HIGH (ref 24–36)
aPTT: 58 seconds — ABNORMAL HIGH (ref 24–36)

## 2022-11-24 LAB — HEPARIN LEVEL (UNFRACTIONATED): Heparin Unfractionated: 0.42 IU/mL (ref 0.30–0.70)

## 2022-11-24 SURGERY — COLONOSCOPY WITH PROPOFOL
Anesthesia: General

## 2022-11-24 MED ORDER — SODIUM CHLORIDE 0.9 % IV SOLN: INTRAVENOUS | Status: DC

## 2022-11-24 MED ORDER — SODIUM CHLORIDE 0.9% FLUSH: 3.0000 mL | Freq: Two times a day (BID) | INTRAVENOUS | Status: DC

## 2022-11-24 MED ORDER — SODIUM CHLORIDE 0.9 % IV SOLN: 250.0000 mL | INTRAVENOUS | Status: DC | PRN

## 2022-11-24 MED ORDER — PROPOFOL 500 MG/50ML IV EMUL
INTRAVENOUS | Status: DC | PRN
  Administered 2022-11-24: 140 ug/kg/min via INTRAVENOUS

## 2022-11-24 MED ORDER — LIDOCAINE HCL (CARDIAC) PF 100 MG/5ML IV SOSY
PREFILLED_SYRINGE | INTRAVENOUS | Status: DC | PRN
  Administered 2022-11-24: 50 mg via INTRAVENOUS

## 2022-11-24 MED ORDER — HEPARIN (PORCINE) 25000 UT/250ML-% IV SOLN
800.0000 [IU]/h | INTRAVENOUS | Status: DC
  Administered 2022-11-24: 750 [IU]/h via INTRAVENOUS
  Filled 2022-11-24: qty 250

## 2022-11-24 MED ORDER — AMIODARONE HCL 200 MG PO TABS
200.0000 mg | ORAL_TABLET | Freq: Every day | ORAL | Status: DC
  Administered 2022-11-25 – 2022-11-26 (×2): 200 mg via ORAL
  Filled 2022-11-24 (×2): qty 1

## 2022-11-24 MED ORDER — PROPOFOL 10 MG/ML IV BOLUS
INTRAVENOUS | Status: DC | PRN
  Administered 2022-11-24: 50 mg via INTRAVENOUS

## 2022-11-24 MED ORDER — EPHEDRINE SULFATE (PRESSORS) 50 MG/ML IJ SOLN
INTRAMUSCULAR | Status: DC | PRN
  Administered 2022-11-24 (×3): 5 mg via INTRAVENOUS

## 2022-11-24 MED ORDER — PROPOFOL 1000 MG/100ML IV EMUL
INTRAVENOUS | Status: AC
  Filled 2022-11-24: qty 100

## 2022-11-24 MED ORDER — SODIUM CHLORIDE 0.9% FLUSH: 3.0000 mL | INTRAVENOUS | Status: DC | PRN

## 2022-11-24 NOTE — Care Plan (Signed)
Colon still with poor prep but was able to get to cecum. No large masses but smaller lesions could be missed due to prep. Ok to resume heparin drip and restart previous diet. [facility] will resume her care tomorrow.  Raylene Miyamoto MD, MPH [facility]

## 2022-11-24 NOTE — Anesthesia Postprocedure Evaluation (Signed)
Anesthesia Post Note  Patient: Dawn Sherman  Procedure(s) Performed: COLONOSCOPY WITH PROPOFOL  Patient location during evaluation: PACU Anesthesia Type: General Level of consciousness: awake and alert Pain management: pain level controlled Vital Signs Assessment: post-procedure vital signs reviewed and stable Respiratory status: spontaneous breathing, nonlabored ventilation, respiratory function stable and patient connected to nasal cannula oxygen Cardiovascular status: blood pressure returned to baseline and stable Postop Assessment: no apparent nausea or vomiting Anesthetic complications: no   No notable events documented.   Last Vitals:  Vitals:   11/24/22 0939 11/24/22 0946  BP: 116/63   Pulse: 66   Resp: 16   Temp:  36.7 C  SpO2: 96%     Last Pain:  Vitals:   11/24/22 0946  TempSrc:   PainSc: 0-No pain                 Ilene Qua

## 2022-11-24 NOTE — Transfer of Care (Signed)
Immediate Anesthesia Transfer of Care Note  Patient: Dawn Sherman  Procedure(s) Performed: COLONOSCOPY WITH PROPOFOL  Patient Location: PACU  Anesthesia Type:General  Level of Consciousness: drowsy  Airway & Oxygen Therapy: Patient Spontanous Breathing and Patient connected to nasal cannula oxygen  Post-op Assessment: Report given to RN and Post -op Vital signs reviewed and stable  Post vital signs: Reviewed and stable  Last Vitals: // Vitals Value Taken Time  BP 88/44   Temp    Pulse 62   Resp 15   SpO2 99     Last Pain:  Vitals:   11/24/22 0845  TempSrc: Temporal  PainSc: 0-No pain      Patients Stated Pain Goal: 0 ([id number])  Complications: No notable events documented.

## 2022-11-24 NOTE — Progress Notes (Signed)
Rounding Note    Patient Name: Dawn Sherman Date of Encounter: 11/24/2022  [place] Cardiologist: Ida Rogue, MD   Subjective   No chest pain, dyspnea, palpitations, hematochezia or melena. Documented UOP 1.9 L for the admission. EGD yesterday without acute findings, inadequate prep for colonoscopy at that time. Planning for colonoscopy today. Eliquis held, has been maintained on heparin gtt for planned endoscopy today. Renal function slightly improved. Potassium improved. Hgb stable.   Inpatient Medications    Scheduled Meds:  amiodarone  400 mg Oral BID   atorvastatin  40 mg Oral Daily   buPROPion  150 mg Oral Daily   calcium carbonate  1 tablet Oral TID WC   carvedilol  6.25 mg Oral BID WC   dapagliflozin propanediol  10 mg Oral Daily   fluticasone furoate-vilanterol  1 puff Inhalation Daily   And   umeclidinium bromide  1 puff Inhalation Daily   isosorbide-hydrALAZINE  1 tablet Oral TID   loratadine  10 mg Oral Daily   pantoprazole  40 mg Oral Daily   sodium chloride flush  10-40 mL Intracatheter Q12H   sodium chloride flush  3 mL Intravenous Q12H   torsemide  40 mg Oral Daily   venlafaxine XR  150 mg Oral Q breakfast   Continuous Infusions:  sodium chloride     sodium chloride Stopped (11/23/22 0913)   PRN Meds: sodium chloride, albuterol, clonazepam, ondansetron (ZOFRAN) IV, senna-docusate, sodium chloride flush   Vital Signs    Vitals:   11/23/22 2351 11/24/22 0435 11/24/22 0437 11/24/22 0743  BP: (!) 104/58 126/68  129/75  Pulse: 64 67  71  Resp: 18 18    Temp: (!) 97.3 F (36.3 C) 97.7 F (36.5 C)  97.6 F (36.4 C)  TempSrc:    Oral  SpO2: 90% 95%  98%  Weight:   58.2 kg   Height:        Intake/Output Summary (Last 24 hours) at 11/24/2022 0833 Last data filed at 11/24/2022 0441 Gross per 24 hour  Intake 1047.46 ml  Output --  Net 1047.46 ml       11/24/2022    4:37 AM 11/23/2022    4:27 AM 11/22/2022    4:14 AM  Last 3  Weights  Weight (lbs) 128 lb 4.9 oz 127 lb 3.3 oz 121 lb 0.5 oz  Weight (kg) 58.2 kg 57.7 kg 54.9 kg      Telemetry    Sinus with first degree AVB rates 60's-80s bpm - Personally Reviewed  ECG    No new tracings - Personally Reviewed  Physical Exam   GEN: No acute distress.   Neck: No JVD Cardiac: RRR, no murmurs, rubs, or gallops.  Respiratory: Clear to auscultation GI: Soft, nontender, non-distended  MS: No edema; No deformity. Neuro:  Nonfocal  Psych: Normal affect   Labs    High Sensitivity Troponin:   Recent Labs  Lab 11/12/22 1357 11/12/22 1858  TROPONINIHS 10 9      Chemistry Recent Labs  Lab 11/18/22 0429 11/19/22 0452 11/20/22 0613 11/21/22 1118 11/22/22 0654 11/23/22 0208 11/23/22 1143 11/24/22 0331  NA 133* 132* 135   < > 134* 132*  --  134*  K 4.3 4.2 4.7   < > 4.8 5.3* 5.8* 4.8  CL 94* 98 100   < > 100 100  --  104  CO2 27 26 27    < > 26 23  --  23  GLUCOSE 91  160* 109*   < > 97 87  --  88  BUN 86* 76* 73*   < > 67* 61*  --  46*  CREATININE 3.19* 2.91* 2.82*   < > 2.76* 2.53*  --  2.31*  CALCIUM 8.8* 8.5* 8.4*   < > 8.3* 8.4*  --  8.6*  MG 2.2 2.2 2.1  --   --   --   --   --   GFRNONAA 15* 17* 18*   < > 18* 20*  --  23*  ANIONGAP 12 8 8    < > 8 9  --  7   < > = values in this interval not displayed.     Lipids No results for input(s): "CHOL", "TRIG", "HDL", "LABVLDL", "LDLCALC", "CHOLHDL" in the last 168 hours.  Hematology Recent Labs  Lab 11/22/22 0654 11/23/22 0208 11/24/22 0331  WBC 8.6 6.4 6.6  RBC 3.50* 3.21* 3.25*  HGB 9.1* 8.3* 8.4*  HCT 29.5* 27.4* 27.9*  MCV 84.3 85.4 85.8  MCH 26.0 25.9* 25.8*  MCHC 30.8 30.3 30.1  RDW 21.8* 22.0* 22.5*  PLT 268 214 226    Thyroid No results for input(s): "TSH", "FREET4" in the last 168 hours.  BNP No results for input(s): "BNP", "PROBNP" in the last 168 hours.   DDimer No results for input(s): "DDIMER" in the last 168 hours.   Radiology    DG Chest 2 View  Result Date:  11/20/2022 IMPRESSION: 1. Interval decreased volume of right pleural effusion with improved aeration to the right lung base. 2. Mild edema. Electronically Signed   By: Kerby Moors M.D.   On: 11/20/2022 15:03    Cardiac Studies  TTE 11/14/22 1. Left ventricular ejection fraction, by estimation, is 25 to 30%. The  left ventricle has severely decreased function. The left ventricle  demonstrates global hypokinesis. There is moderate left ventricular  hypertrophy. Left ventricular diastolic  parameters are indeterminate.   2. Right ventricular systolic function is moderately reduced. The right  ventricular size is normal. There is moderately elevated pulmonary artery  systolic pressure. The estimated right ventricular systolic pressure is  [id number] mmHg.   3. Left atrial size was severely dilated.   4. A small pericardial effusion is present.   5. The mitral valve is normal in structure. Severe mitral valve  regurgitation. No evidence of mitral stenosis.   6. Tricuspid valve regurgitation is moderate to severe.   7. The aortic valve is normal in structure. Aortic valve regurgitation is  not visualized. No aortic stenosis is present.   8. The inferior vena cava is normal in size with greater than 50%  respiratory variability, suggesting right atrial pressure of 3 mmHg.    [place] 11/15/2022 Right heart catheterization pressures RA (mean): 21 mmHg RV (S/EDP): 62/24 mmHg PA (S/D, mean): 63/38 (46) mmHg PCWP (mean): 34 mmHg  Ao sat: 91% PA sat: 30%  Fick CO: 3.4 L/min Fick CI: 2.0 L/min/m^2  PVR: 3.5 Wood units PAPi: 1.2    Patient Profile     67 y.o. female with a past medical history of current tobacco use, COPD, paroxysmal atrial fibrillation/atrial flutter, moderate MR, chronic kidney disease stage IV, cardiomyopathy with an ejection fraction of 30-35% in the setting of atrial fibrillation/flutter on apixaban who presents to the hospital via the cardiology clinic for worsening shortness  of breath and anemia.  Assessment & Plan    Acute on chronic combined systolic and diastolic congestive heart failure -Severely reduced function on  echo with LVEF 25-30% -Recent stress testing revealed a fixed defect without significant ischemia -RHC on 11/15/22 with RA 21, PA 63/38, PCWP 34, C.O. 3.4, C.I 2.0 -She was treated with milrinone which was discontinued 3/26, she has continued to improve -She appears to be euvolemic on torsemide 40 mg once daily and Farxiga 10 mg daily -Continue titrated dose of carvedilol to 6.25 mg twice daily -No ACE inhibitor, ARB, ARNI or MRA due to acute on chronic kidney disease -For now, continue newly started BiDil  -If renal function improves, BiDil can be switched to [facility] in the outpatient setting -Given progressive drop in her ejection fraction, we should consider left heart catheterization with low contrast at some point, hopefully this can be done once her GFR is above 30, timing TBD, perhaps prior to discharge early next week if she remains admitted, may need to consider staged procedure  -As outpatient, would look to have her establish with the advanced heart failure team  Acute on chronic anemia -Hgb stable -No active signs of bleeding -Iron given 11/13/22 -Has received blood this admission -EGD unrevealing, colonoscopy planned for today  Paroxysmal atrial fibrillation/flutter -A-fib with RVR was likely the culprit for decompensated heart failure -She converted to sinus rhythm with IV amiodarone -She is now on oral amiodarone 400 mg twice daily which should be continued for today, then decrease to 200 mg twice daily on 4/1 for a week then 200 mg once daily thereafter -Eliquis on hold, on heparin gtt, for washout for planned colonoscopy   Chronic kidney disease stage IV -Followed by nephrology -Renal function stable  Long history of tobacco use with COPD -She reports that she quit smoking last month -Continued on oxygen therapy      CHA2DS2-VASc Score = 5   This indicates a 7.2% annual risk of stroke. The patient's score is based upon: CHF History: 1 HTN History: 1 Diabetes History: 0 Stroke History: 0 Vascular Disease History: 1 Age Score: 1 Gender Score: 1     For questions or updates, please contact [place] Please consult [email or web address] for contact info under        Signed, Christell Faith, PA-C  11/24/2022, 8:33 AM

## 2022-11-24 NOTE — Progress Notes (Signed)
PROGRESS NOTE    [name]  [id number]  DOB: November 11, 1955  DOA: 11/12/2022 PCP: [name], MD Outpatient Specialists:   Hospital course:  [name] is a 67 year old female with history of heart failure preserved ejection fraction, paroxysmal atrial fibrillation on low-dose Eliquis, history of subarachnoid hemorrhage secondary to mechanical fall, CKD stage IV, COPD, hypertension, who presents to emergency department from cardiology outpatient clinic for chief concerns of symptomatic anemia.   Vitals in the ED showed temperature of 97.8, respiration rate 18, heart rate 66, blood pressure 116/66, SpO2 of 97% on room air.   Serum sodium is 134, potassium 5.2, chloride 99, bicarb 25, BUN 40, serum creatinine of 2.71, nonfasting blood glucose 131, WBC 8.1, hemoglobin 7.7, platelets of 308.  eGFR 19.  BNP elevated at 3247.   High sensitive troponin has been ordered and is in process.   ED treatment: None   3/20: Vital stable on 4 L of oxygen this morning.  Apparently patient developed shortness of breath with flushing and muscle aches while getting blood transfusion which was discontinued at that time UA and transfusion reaction labs were negative.  Chest x-ray with pulmonary vascular congestion s/p IV Lasix and Benadryl.  CBC this morning with hemoglobin of 8.  Anemia panel with iron deficiency.  IV iron supplement ordered. BMP with slight worsening of creatinine to 2.96.  And potassium of 5.3.  Nephrology was also consulted.   3/21: Vitals and labs seems stable.  Remained on 3 L of oxygen.  Cardiology was also consulted and patient will be going for right heart catheterization tomorrow.   3/22: Vital stable, renal function continued to get worse slowly, persistent hyperkalemia with potassium at 5.4 despite getting Veltassa - dose was increased today. Echocardiogram with worsening of EF and right heart catheterization today with significantly elevated pressures.  Patient  was started on milrinone and Lasix infusion by cardiology.   3/23: Vital stable, remained on 3 L of oxygen.  Hemoglobin improved to 10, slight worsening of renal function with creatinine at 2.91, UOP recorded 2350, patient remains on milrinone and Lasix infusion.   3/24: Hemodynamically stable.  Worsening renal function, UOP recorded of more than 5 L in 24 hours, patient also received metolazone.  CVP or 10.  After discussing with cardiology and nephrology we are holding Lasix infusion today.  A-fib with RVR today heart rate cardiology switched to p.o. amiodarone with IV bolus and infusion.   3/25: Vital stable.  Converted back to sinus rhythm.  Creatinine at 3.19, nephrology decided to continue holding Lasix for another day.  Milrinone dose was decreased today.  Will remain on amiodarone infusion with a plan to convert to p.o. tomorrow.   3/26: Hemodynamically stable.  Renal functions with slow improvement, good UOP.  Nephrology restarted [place].  Remained on amiodarone and milrinone infusions.  Most likely will be converted to p.o. amiodarone today.   3/27: continues to improve. Off milronone. Coreg increased. Cardiology tentatively planning on left heart cath   3/29: stable, awaiting endoluminal eval tomorrow  3/30: EGD was WNL.  Colonoscopy not done due to solid stool.  Plan for colonoscopy tomorrow. 3/31: Colonoscopy with poor prep.  No large lesions seen.     Subjective:  Patient is glad that her colonoscopy is over.  Is looking forward to her liquid lunch.   Objective: Vitals:   11/24/22 0939 11/24/22 0946 11/24/22 1100 11/24/22 1547  BP: 116/63  (!) 125/99 (!) 116/52  Pulse: 66  72  79  Resp: 16  18   Temp:  98 F (36.7 C) 98.2 F (36.8 C) 98.1 F (36.7 C)  TempSrc:   Oral Oral  SpO2: 96%   95%  Weight:      Height:        Intake/Output Summary (Last 24 hours) at 11/24/2022 1606 Last data filed at 11/24/2022 0920 Gross per 24 hour  Intake 441.6 ml  Output --  Net  441.6 ml    Filed Weights   11/22/22 0414 11/23/22 0427 11/24/22 0437  Weight: 54.9 kg 57.7 kg 58.2 kg     Exam:  General: Well-appearing female sitting up in bed in good spirits. Eyes: sclera anicteric, conjuctiva mild injection bilaterally CVS: S1-S2, regular  Respiratory:  decreased air entry bilaterally secondary to decreased inspiratory effort, rales at bases  GI: NABS, soft, NT  LE: Warm and well-perfused Neuro: A/O x 3,  grossly nonfocal.  Psych: patient is logical and coherent, judgement and insight appear normal, mood and affect appropriate to situation.  Data Reviewed:  Basic Metabolic Panel: Recent Labs  Lab 11/18/22 0429 11/19/22 0452 11/20/22 [id number] 11/21/22 1118 11/22/22 0654 11/23/22 0208 11/23/22 1143 11/24/22 0331  NA 133* 132* 135 134* 134* 132*  --  134*  K 4.3 4.2 4.7 4.5 4.8 5.3* 5.8* 4.8  CL 94* 98 100 98 100 100  --  104  CO2 27 26 27 25 26 23   --  23  GLUCOSE 91 160* 109* 207* 97 87  --  88  BUN 86* 76* 73* 64* 67* 61*  --  46*  CREATININE 3.19* 2.91* 2.82* 2.66* 2.76* 2.53*  --  2.31*  CALCIUM 8.8* 8.5* 8.4* 8.7* 8.3* 8.4*  --  8.6*  MG 2.2 2.2 2.1  --   --   --   --   --      CBC: Recent Labs  Lab 11/22/22 0654 11/23/22 0208 11/24/22 0331  WBC 8.6 6.4 6.6  HGB 9.1* 8.3* 8.4*  HCT 29.5* 27.4* 27.9*  MCV 84.3 85.4 85.8  PLT 268 214 226      Scheduled Meds:  [START ON 11/25/2022] amiodarone  200 mg Oral Daily   atorvastatin  40 mg Oral Daily   buPROPion  150 mg Oral Daily   calcium carbonate  1 tablet Oral TID WC   carvedilol  6.25 mg Oral BID WC   dapagliflozin propanediol  10 mg Oral Daily   fluticasone furoate-vilanterol  1 puff Inhalation Daily   And   umeclidinium bromide  1 puff Inhalation Daily   isosorbide-hydrALAZINE  1 tablet Oral TID   loratadine  10 mg Oral Daily   pantoprazole  40 mg Oral Daily   sodium chloride flush  10-40 mL Intracatheter Q12H   sodium chloride flush  3 mL Intravenous Q12H   torsemide  40 mg  Oral Daily   venlafaxine XR  150 mg Oral Q breakfast   Continuous Infusions:  sodium chloride     sodium chloride Stopped (11/23/22 0913)   heparin 750 Units/hr (11/24/22 1056)     Assessment & Plan:   Symptomatic anemia IDA/ACD EGD this morning was negative Colonoscopy today was with poor prep but no large lesions seen Previously, patient was unable to complete 1 unit PRBC due to decompensated heart failure requiring Lasix.  HFrEF and HFpEF HTN Dyslipidemia Cardiology is planning to do [facility] tomorrow because of unexplained drop in [facility] Nephrology has noted she will need IVF prior to [facility] However, patient  developed decompensated heart failure with attempt at transfusion She has subsequently diuresed well and is at present euvolemic.  CKD 4 Creatinine has improved and is now back to baseline Cardiology would like a GFR greater than 30 prior to [facility] plan is for [facility] tomorrow per cardiology Nephrology is following  Atrial fibrillation Patient was started on amiodarone drip and converted back to NSR Has been transitioned over to p.o. prednisone On heparin drip until Eliquis can be restarted on discharge  GERD Continue PPI    DVT prophylaxis: Heparin drip Code Status: Full     Studies: No results found.  Principal Problem:   Acute on chronic combined systolic and diastolic CHF (congestive heart failure) (HCC) Active Problems:   Acute hypoxemic respiratory failure (HCC)   Symptomatic anemia   Persistent atrial fibrillation (HCC)   CKD (chronic kidney disease), stage IV (HCC)   Hyperkalemia   HLD (hyperlipidemia)   Tobacco use   Essential hypertension   Depression, major, single episode, mild (HCC)   GERD without esophagitis   Dyspnea   Hypoxia   Dilated cardiomyopathy ([place])   Mitral valve insufficiency     Arlette Schaad Tublu Ahmani Daoud, [facility]  If 7PM-7AM, please contact night-coverage [email or web address]   LOS: 12 days

## 2022-11-24 NOTE — Consult Note (Signed)
ANTICOAGULATION CONSULT NOTE - Follow Up Consult  Pharmacy Consult for heparin infusion Indication: atrial fibrillation  Allergies  Allergen Reactions   Sulfate Rash   Codeine Sulfate Nausea Only   Benicar [Olmesartan]     Talked with patient February 10, 2020, intolerance is unclear, tried several medications around that time and one of them gave her a rash but she is not clear which 1.   Amoxicillin Rash   Clindamycin/Lincomycin Rash   Entresto [Sacubitril-Valsartan] Other (See Comments)    hyperkalemia   Morphine And Related Rash   Penicillins Rash    Patient Measurements: Height: 5\' 5"  (165.1 cm) Weight: 58.2 kg (128 lb 4.9 oz) IBW/kg (Calculated) : 57 Heparin Dosing Weight: 54.9 kg  Vital Signs: Temp: 98 F (36.7 C) (03/31 0946) Temp Source: Temporal (03/31 0845) BP: 116/63 (03/31 0939) Pulse Rate: 66 (03/31 0939)  Labs: Recent Labs    11/21/22 1118 11/21/22 1118 11/22/22 0654 11/22/22 1605 11/23/22 0208 11/23/22 1143 11/23/22 2009 11/24/22 0331  HGB  --    < > 9.1*  --  8.3*  --   --  8.4*  HCT  --   --  29.5*  --  27.4*  --   --  27.9*  PLT  --   --  268  --  214  --   --  226  APTT 36  --  35   < > 36 142* 117* 148*  LABPROT 14.8  --   --   --   --   --   --   --   INR 1.2  --   --   --   --   --   --   --   HEPARINUNFRC  --   --  >1.10*  --  0.90*  --   --   --   CREATININE 2.66*  --  2.76*  --  2.53*  --   --  2.31*   < > = values in this interval not displayed.     Estimated Creatinine Clearance: 21.3 mL/min (A) (by C-G formula based on SCr of 2.31 mg/dL (H)).   Medications:  Medications Prior to Admission  Medication Sig Dispense Refill Last Dose   acetaminophen (TYLENOL) 650 MG CR tablet Take 650 mg by mouth every 8 (eight) hours as needed for pain or fever.   Past Week   albuterol (VENTOLIN HFA) 108 (90 Base) MCG/ACT inhaler Inhale 2 puffs into the lungs every 6 (six) hours as needed for wheezing or shortness of breath. 8 g 2 Past Week    amiodarone (PACERONE) 200 MG tablet Take 1 tablet (200 mg total) by mouth daily. 30 tablet 1 11/12/2022   apixaban (ELIQUIS) 2.5 MG TABS tablet Take 1 tablet (2.5 mg total) by mouth 2 (two) times daily. 60 tablet 6 11/12/2022 at 0600   atorvastatin (LIPITOR) 40 MG tablet Take 1 tablet (40 mg total) by mouth daily. 90 tablet 1 11/12/2022   buPROPion (WELLBUTRIN XL) 150 MG 24 hr tablet Take 1 tablet (150 mg total) by mouth daily. 90 tablet 1 11/12/2022   calcium carbonate (OS-CAL - DOSED IN MG OF ELEMENTAL CALCIUM) 1250 (500 Ca) MG tablet Take 1 tablet (500 mg of elemental calcium total) by mouth 3 (three) times daily with meals. 60 tablet 1 11/12/2022   FARXIGA 10 MG TABS tablet Take 10 mg by mouth daily.   11/12/2022   Fluticasone-Umeclidin-Vilant (TRELEGY ELLIPTA) 100-62.5-25 MCG/ACT AEPB Inhale 1 puff into the lungs daily.  3 each 3 11/12/2022   loratadine (CLARITIN) 10 MG tablet Take 10 mg by mouth daily.   11/12/2022   metoprolol succinate (TOPROL XL) 25 MG 24 hr tablet Take 1 tablet (25 mg total) by mouth daily. 30 tablet 1 11/12/2022   omeprazole (PRILOSEC) 20 MG capsule Take 20 mg by mouth daily.   11/12/2022   torsemide (DEMADEX) 20 MG tablet Take 2 tablets (40 mg) by mouth once daily. You may take an extra 2 tablets (40 mg) in the afternoon as needed for leg swelling 270 tablet 3 11/12/2022   venlafaxine XR (EFFEXOR-XR) 150 MG 24 hr capsule TAKE ONE CAPSULE BY MOUTH EVERY DAY 90 capsule 1 11/12/2022   Scheduled:   amiodarone  400 mg Oral BID   atorvastatin  40 mg Oral Daily   buPROPion  150 mg Oral Daily   calcium carbonate  1 tablet Oral TID WC   carvedilol  6.25 mg Oral BID WC   dapagliflozin propanediol  10 mg Oral Daily   fluticasone furoate-vilanterol  1 puff Inhalation Daily   And   umeclidinium bromide  1 puff Inhalation Daily   isosorbide-hydrALAZINE  1 tablet Oral TID   loratadine  10 mg Oral Daily   pantoprazole  40 mg Oral Daily   sodium chloride flush  10-40 mL Intracatheter Q12H    sodium chloride flush  3 mL Intravenous Q12H   torsemide  40 mg Oral Daily   venlafaxine XR  150 mg Oral Q breakfast   Infusions:   sodium chloride     sodium chloride Stopped (11/23/22 0913)   PRN: sodium chloride, albuterol, clonazepam, ondansetron (ZOFRAN) IV, senna-docusate, sodium chloride flush Anti-infectives (From admission, onward)    None       Assessment: 58 YOF admitted for CHF presenting to ED with CC of symptomatic anemia. PMH includes HFpEF, paroxysmal Afib, subarachnoid hemorrhage secondary to mechanical fall, CKD stage 4, HLD, GERD, HTN, COPD. Patient takes apixaban 2.5 mg BID for Afib. Per provider, the plan is to stop apixaban and start heparin infusion. The last dose of apixaban was 3/28 at 0914. CHADSVASc 4.  Pharmacy is being consulted for initiation of heparin infusion for afib while holding apixaban.   Heparin level is still falsely elevated due to prior anticoagulation with apixaban. Using aPTT monitoring until aPTT and heparin levels correlate.   Heparin monitoring: 0329 0654 aPTT 35s, subtherapeutic  0329 1605 aPTT 33s, subtherapeutic increase heparin infusion to 900 units/hr. 0330 0208 aPTT 36s,  HL 0.9  inc to 1100 u/hr 03/30 1143 aPTT 142  SUPRAtherapeutic, hold drip x 1 hour and decrease to 950 u/hr  03/30 2009 aPTT 177, supratherapeutic @ 950 units/hr>>hold x 1 hour and reduce to 850 unit/hr 03/31 0331 aPTT 148  SUPRA therapeutic at 850 u/hr. Drip d/c for coloscopy.   Goal of Therapy:  Heparin level 0.3-0.5 units/ml when heparin level and aPTT correlate.  aPTT 66-85 seconds Monitor platelets by anticoagulation protocol: Yes   Plan:  Per consult: Do not bolus and use lower therapeutic range for monitoring due to possible GI bleed.   - - Heparin drip held 3/31 at 0327 for colonscopy. Now OK to resume per GI 3/31. Will resume Heparin drip at 0750 units/hr based off last aPTT drawn -check aPTT 6 hrs after restart - once aPTT and heparin level  correlate, Switch to heparin level monitoring   Keelia Graybill A, PharmD Clinical Pharmacist   11/24/2022,10:18 AM

## 2022-11-24 NOTE — Progress Notes (Signed)
Pt unable to finish container of bowel prep. Drank about half of the container before stating she could not take anymore.

## 2022-11-24 NOTE — Consult Note (Signed)
ANTICOAGULATION CONSULT NOTE - Follow Up Consult  Pharmacy Consult for heparin infusion Indication: atrial fibrillation  Allergies  Allergen Reactions   Sulfate Rash   Codeine Sulfate Nausea Only   Benicar [Olmesartan]     Talked with patient February 10, 2020, intolerance is unclear, tried several medications around that time and one of them gave her a rash but she is not clear which 1.   Amoxicillin Rash   Clindamycin/Lincomycin Rash   Entresto [Sacubitril-Valsartan] Other (See Comments)    hyperkalemia   Morphine And Related Rash   Penicillins Rash    Patient Measurements: Height: 5\' 5"  (165.1 cm) Weight: 58.2 kg (128 lb 4.9 oz) IBW/kg (Calculated) : 57 Heparin Dosing Weight: 54.9 kg  Vital Signs: Temp: 98.1 F (36.7 C) (03/31 1547) Temp Source: Oral (03/31 1547) BP: 116/52 (03/31 1547) Pulse Rate: 79 (03/31 1547)  Labs: Recent Labs    11/22/22 0654 11/22/22 1605 11/23/22 0208 11/23/22 1143 11/23/22 2009 11/24/22 0331 11/24/22 1656  HGB 9.1*  --  8.3*  --   --  8.4*  --   HCT 29.5*  --  27.4*  --   --  27.9*  --   PLT 268  --  214  --   --  226  --   APTT 35   < > 36   < > 117* 148* 58*  HEPARINUNFRC >1.10*  --  0.90*  --   --   --  0.42  CREATININE 2.76*  --  2.53*  --   --  2.31*  --    < > = values in this interval not displayed.     Estimated Creatinine Clearance: 21.3 mL/min (A) (by C-G formula based on SCr of 2.31 mg/dL (H)).   Medications:  Medications Prior to Admission  Medication Sig Dispense Refill Last Dose   acetaminophen (TYLENOL) 650 MG CR tablet Take 650 mg by mouth every 8 (eight) hours as needed for pain or fever.   Past Week   albuterol (VENTOLIN HFA) 108 (90 Base) MCG/ACT inhaler Inhale 2 puffs into the lungs every 6 (six) hours as needed for wheezing or shortness of breath. 8 g 2 Past Week   amiodarone (PACERONE) 200 MG tablet Take 1 tablet (200 mg total) by mouth daily. 30 tablet 1 11/12/2022   apixaban (ELIQUIS) 2.5 MG TABS tablet Take  1 tablet (2.5 mg total) by mouth 2 (two) times daily. 60 tablet 6 11/12/2022 at 0600   atorvastatin (LIPITOR) 40 MG tablet Take 1 tablet (40 mg total) by mouth daily. 90 tablet 1 11/12/2022   buPROPion (WELLBUTRIN XL) 150 MG 24 hr tablet Take 1 tablet (150 mg total) by mouth daily. 90 tablet 1 11/12/2022   calcium carbonate (OS-CAL - DOSED IN MG OF ELEMENTAL CALCIUM) 1250 (500 Ca) MG tablet Take 1 tablet (500 mg of elemental calcium total) by mouth 3 (three) times daily with meals. 60 tablet 1 11/12/2022   FARXIGA 10 MG TABS tablet Take 10 mg by mouth daily.   11/12/2022   Fluticasone-Umeclidin-Vilant (TRELEGY ELLIPTA) 100-62.5-25 MCG/ACT AEPB Inhale 1 puff into the lungs daily. 3 each 3 11/12/2022   loratadine (CLARITIN) 10 MG tablet Take 10 mg by mouth daily.   11/12/2022   metoprolol succinate (TOPROL XL) 25 MG 24 hr tablet Take 1 tablet (25 mg total) by mouth daily. 30 tablet 1 11/12/2022   omeprazole (PRILOSEC) 20 MG capsule Take 20 mg by mouth daily.   11/12/2022   torsemide (DEMADEX) 20 MG  tablet Take 2 tablets (40 mg) by mouth once daily. You may take an extra 2 tablets (40 mg) in the afternoon as needed for leg swelling 270 tablet 3 11/12/2022   venlafaxine XR (EFFEXOR-XR) 150 MG 24 hr capsule TAKE ONE CAPSULE BY MOUTH EVERY DAY 90 capsule 1 11/12/2022   Scheduled:   [START ON 11/25/2022] amiodarone  200 mg Oral Daily   atorvastatin  40 mg Oral Daily   buPROPion  150 mg Oral Daily   calcium carbonate  1 tablet Oral TID WC   carvedilol  6.25 mg Oral BID WC   dapagliflozin propanediol  10 mg Oral Daily   fluticasone furoate-vilanterol  1 puff Inhalation Daily   And   umeclidinium bromide  1 puff Inhalation Daily   isosorbide-hydrALAZINE  1 tablet Oral TID   loratadine  10 mg Oral Daily   pantoprazole  40 mg Oral Daily   sodium chloride flush  10-40 mL Intracatheter Q12H   sodium chloride flush  3 mL Intravenous Q12H   torsemide  40 mg Oral Daily   venlafaxine XR  150 mg Oral Q breakfast    Infusions:   sodium chloride     sodium chloride Stopped (11/23/22 0913)   heparin 750 Units/hr (11/24/22 1056)   PRN: sodium chloride, albuterol, clonazepam, ondansetron (ZOFRAN) IV, senna-docusate, sodium chloride flush Anti-infectives (From admission, onward)    None       Assessment: 38 YOF admitted for CHF presenting to ED with CC of symptomatic anemia. PMH includes HFpEF, paroxysmal Afib, subarachnoid hemorrhage secondary to mechanical fall, CKD stage 4, HLD, GERD, HTN, COPD. Patient takes apixaban 2.5 mg BID for Afib. Per provider, the plan is to stop apixaban and start heparin infusion. The last dose of apixaban was 3/28 at 0914. CHADSVASc 4.  Pharmacy is being consulted for initiation of heparin infusion for afib while holding apixaban.   Heparin level is still falsely elevated due to prior anticoagulation with apixaban. Using aPTT monitoring until aPTT and heparin levels correlate.   Heparin monitoring: 0329 0654 aPTT 35s, subtherapeutic  0329 1605 aPTT 33s, subtherapeutic increase heparin infusion to 900 units/hr. 0330 0208 aPTT 36s,  HL 0.9  inc to 1100 u/hr 03/30 1143 aPTT 142  SUPRAtherapeutic, hold drip x 1 hour and decrease to 950 u/hr  03/30 2009 aPTT 177, supratherapeutic @ 950 units/hr>>hold x 1 hour and reduce to 850 unit/hr 03/31 0331 aPTT 148  SUPRA therapeutic at 850 u/hr. Drip d/c for coloscopy. 0331 1656 aPTT 58s/HL 0.42, subtherapeutic aPTT @ 750 units/hr  Goal of Therapy:  Heparin level 0.3-0.5 units/ml when heparin level and aPTT correlate.  aPTT 66-85 seconds Monitor platelets by anticoagulation protocol: Yes   Plan: aPTT is subtherapeutic Per consult: Do not bolus and use lower therapeutic range for monitoring due to possible GI bleed.   -Increase heparin infusion rate to 800 units/hr -Recheck aPTT 8 hours after rate adjustment -Continue to adjust based on aPTT until correlation with HL -CBC daily while on heparin   Lorin Picket,  PharmD Clinical Pharmacist   11/24/2022,6:04 PM

## 2022-11-24 NOTE — Op Note (Signed)
[facility] Gastroenterology Patient Name: Dawn Sherman Procedure Date: 11/24/2022 7:34 AM MRN: [id number] Account #: [id number] Date of Birth: 07/17/1956 Admit Type: Inpatient Age: 67 Room: [facility] ENDO ROOM 4 Gender: Female Note Status: Finalized Instrument Name: Peds Colonoscope [id number] Procedure:             Colonoscopy Indications:           Iron deficiency anemia Providers:             Andrey Farmer MD, MD Referring MD:          [name], MD (Referring MD) Medicines:             Monitored Anesthesia Care Complications:         No immediate complications. Procedure:             Pre-Anesthesia Assessment:                        - Prior to the procedure, a History and Physical was                         performed, and patient medications and allergies were                         reviewed. The patient is competent. The risks and                         benefits of the procedure and the sedation options and                         risks were discussed with the patient. All questions                         were answered and informed consent was obtained.                         Patient identification and proposed procedure were                         verified by the physician, the nurse, the                         anesthesiologist, the anesthetist and the technician                         in the endoscopy suite. Mental Status Examination:                         alert and oriented. Airway Examination: normal                         oropharyngeal airway and neck mobility. Respiratory                         Examination: clear to auscultation. CV Examination:                         normal. Prophylactic Antibiotics: The patient does not  require prophylactic antibiotics. Prior                         Anticoagulants: The patient has taken heparin, last                         dose was day of procedure. ASA Grade Assessment: IV -                          A patient with severe systemic disease that is a                         constant threat to life. After reviewing the risks and                         benefits, the patient was deemed in satisfactory                         condition to undergo the procedure. The anesthesia                         plan was to use monitored anesthesia care (MAC).                         Immediately prior to administration of medications,                         the patient was re-assessed for adequacy to receive                         sedatives. The heart rate, respiratory rate, oxygen                         saturations, blood pressure, adequacy of pulmonary                         ventilation, and response to care were monitored                         throughout the procedure. The physical status of the                         patient was re-assessed after the procedure.                        After obtaining informed consent, the colonoscope was                         passed under direct vision. Throughout the procedure,                         the patient's blood pressure, pulse, and oxygen                         saturations were monitored continuously. The                         Colonoscope was introduced through the anus and  advanced to the the cecum, identified by appendiceal                         orifice and ileocecal valve. The colonoscopy was                         somewhat difficult due to poor bowel prep. The patient                         tolerated the procedure well. The quality of the bowel                         preparation was poor. The ileocecal valve, appendiceal                         orifice, and rectum were photographed. Findings:      The perianal and digital rectal examinations were normal.      Internal hemorrhoids were found during retroflexion. The hemorrhoids       were Grade I (internal hemorrhoids that do not prolapse).       The exam was otherwise without abnormality on direct and retroflexion       views. There were no large masses but not adequate views given poor prep. Impression:            - Preparation of the colon was poor.                        - Internal hemorrhoids.                        - The examination was otherwise normal on direct and                         retroflexion views.                        - No specimens collected. Recommendation:        - Return patient to hospital ward for ongoing care.                        - Advance diet as tolerated.                        - Continue present medications.                        - Ok to resume heparin drip. Procedure Code(s):     --- Professional ---                        [id number], Colonoscopy, flexible; diagnostic, including                         collection of specimen(s) by brushing or washing, when                         performed (separate procedure) Diagnosis Code(s):     --- Professional ---  K64.0, First degree hemorrhoids                        D50.9, Iron deficiency anemia, unspecified CPT copyright 2022 American Medical Association. All rights reserved. The codes documented in this report are preliminary and upon coder review may  be revised to meet current compliance requirements. Andrey Farmer MD, MD 11/24/2022 9:35:33 AM Number of Addenda: 0 Note Initiated On: 11/24/2022 7:34 AM Scope Withdrawal Time: 0 hours 6 minutes 44 seconds  Total Procedure Duration: 0 hours 17 minutes 40 seconds  Estimated Blood Loss:  Estimated blood loss: none.      [facility]

## 2022-11-24 NOTE — Anesthesia Preprocedure Evaluation (Signed)
Anesthesia Evaluation  Patient identified by MRN, date of birth, ID band Patient awake    Reviewed: Allergy & Precautions, H&P , NPO status , Patient's Chart, lab work & pertinent test results, reviewed documented beta blocker date and time   History of Anesthesia Complications Negative for: history of anesthetic complications  Airway Mallampati: I  TM Distance: >3 FB Neck ROM: full    Dental  (+) Poor Dentition, Missing, Upper Dentures, Edentulous Upper, Dental Advidsory Given   Pulmonary shortness of breath (on oxygen now) and with exertion, neg sleep apnea, COPD, neg recent URI, former smoker   Pulmonary exam normal breath sounds clear to auscultation       Cardiovascular Exercise Tolerance: Good hypertension, (-) angina +CHF  (-) Past MI and (-) Cardiac Stents + dysrhythmias Atrial Fibrillation (-) Valvular Problems/Murmurs Rhythm:regular Rate:Normal     Neuro/Psych  PSYCHIATRIC DISORDERS Anxiety Depression    negative neurological ROS     GI/Hepatic Neg liver ROS,GERD  ,,  Endo/Other  neg diabetesHypothyroidism    Renal/GU CRFRenal disease  negative genitourinary   Musculoskeletal   Abdominal   Peds  Hematology negative hematology ROS (+)   Anesthesia Other Findings Past Medical History: No date: B12 deficiency No date: Cardiomyopathy [facility])     Comment:  a. 09/2022 Echo: EF 35-40%; b. 09/2022 MV: apical defect               w/ mild peri-infarct ischemia. EF 43%. No date: Chronic HFrEF (heart failure with reduced ejection fraction)  ([place])     Comment:  a. 01/2020 Echo: EF 30-35%; b. 07/2020 Echo: EF 50-55%;               c. 06/2022 Echo: EF 50-55%; d. 09/2022 Echo: EF 35-40%,               globh HK, mod reduced RV fxn, sev BAE, mod MR. No date: CKD (chronic kidney disease), stage IV (HCC) No date: COPD (chronic obstructive pulmonary disease) (HCC) No date: Depression No date: Endometriosis No date:  Hypertension No date: Mixed hyperlipidemia No date: Moderate mitral regurgitation No date: Normocytic anemia No date: PAF (paroxysmal atrial fibrillation) (HCC)     Comment:  a. CHA2DS2VASc = 4-->eliquis 2.5 bid. No date: Paroxymsal Atrial flutter (HCC) No date: Tobacco abuse   Reproductive/Obstetrics negative OB ROS                              Anesthesia Physical Anesthesia Plan  ASA: 4  Anesthesia Plan: General   Post-op Pain Management:    Induction: Intravenous  PONV Risk Score and Plan: 3 and Propofol infusion and TIVA  Airway Management Planned: Natural Airway and Nasal Cannula  Additional Equipment:   Intra-op Plan:   Post-operative Plan:   Informed Consent: I have reviewed the patients History and Physical, chart, labs and discussed the procedure including the risks, benefits and alternatives for the proposed anesthesia with the patient or authorized representative who has indicated his/her understanding and acceptance.     Dental Advisory Given  Plan Discussed with: Anesthesiologist, CRNA and Surgeon  Anesthesia Plan Comments: (Patient consented for risks of anesthesia including but not limited to:  - adverse reactions to medications - risk of airway placement if required - damage to eyes, teeth, lips or other oral mucosa - nerve damage due to positioning  - sore throat or hoarseness - Damage to heart, brain, nerves, lungs, other parts of body  or loss of life  Patient voiced understanding.)        Anesthesia Quick Evaluation

## 2022-11-24 NOTE — Consult Note (Signed)
ANTICOAGULATION CONSULT NOTE - Follow Up Consult  Pharmacy Consult for heparin infusion Indication: atrial fibrillation  Allergies  Allergen Reactions   Sulfate Rash   Codeine Sulfate Nausea Only   Benicar [Olmesartan]     Talked with patient February 10, 2020, intolerance is unclear, tried several medications around that time and one of them gave her a rash but she is not clear which 1.   Amoxicillin Rash   Clindamycin/Lincomycin Rash   Entresto [Sacubitril-Valsartan] Other (See Comments)    hyperkalemia   Morphine And Related Rash   Penicillins Rash    Patient Measurements: Height: 5\' 5"  (165.1 cm) Weight: 57.7 kg (127 lb 3.3 oz) IBW/kg (Calculated) : 57 Heparin Dosing Weight: 54.9 kg  Vital Signs: Temp: 97.3 F (36.3 C) (03/30 2351) Temp Source: Oral (03/30 1947) BP: 104/58 (03/30 2351) Pulse Rate: 64 (03/30 2351)  Labs: Recent Labs    11/21/22 1118 11/21/22 1118 11/22/22 0654 11/22/22 1605 11/23/22 0208 11/23/22 1143 11/23/22 2009 11/24/22 0331  HGB  --    < > 9.1*  --  8.3*  --   --  8.4*  HCT  --   --  29.5*  --  27.4*  --   --  27.9*  PLT  --   --  268  --  214  --   --  226  APTT 36  --  35   < > 36 142* 117* 148*  LABPROT 14.8  --   --   --   --   --   --   --   INR 1.2  --   --   --   --   --   --   --   HEPARINUNFRC  --   --  >1.10*  --  0.90*  --   --   --   CREATININE 2.66*  --  2.76*  --  2.53*  --   --   --    < > = values in this interval not displayed.     Estimated Creatinine Clearance: 19.4 mL/min (A) (by C-G formula based on SCr of 2.53 mg/dL (H)).   Medications:  Medications Prior to Admission  Medication Sig Dispense Refill Last Dose   acetaminophen (TYLENOL) 650 MG CR tablet Take 650 mg by mouth every 8 (eight) hours as needed for pain or fever.   Past Week   albuterol (VENTOLIN HFA) 108 (90 Base) MCG/ACT inhaler Inhale 2 puffs into the lungs every 6 (six) hours as needed for wheezing or shortness of breath. 8 g 2 Past Week    amiodarone (PACERONE) 200 MG tablet Take 1 tablet (200 mg total) by mouth daily. 30 tablet 1 11/12/2022   apixaban (ELIQUIS) 2.5 MG TABS tablet Take 1 tablet (2.5 mg total) by mouth 2 (two) times daily. 60 tablet 6 11/12/2022 at 0600   atorvastatin (LIPITOR) 40 MG tablet Take 1 tablet (40 mg total) by mouth daily. 90 tablet 1 11/12/2022   buPROPion (WELLBUTRIN XL) 150 MG 24 hr tablet Take 1 tablet (150 mg total) by mouth daily. 90 tablet 1 11/12/2022   calcium carbonate (OS-CAL - DOSED IN MG OF ELEMENTAL CALCIUM) 1250 (500 Ca) MG tablet Take 1 tablet (500 mg of elemental calcium total) by mouth 3 (three) times daily with meals. 60 tablet 1 11/12/2022   FARXIGA 10 MG TABS tablet Take 10 mg by mouth daily.   11/12/2022   Fluticasone-Umeclidin-Vilant (TRELEGY ELLIPTA) 100-62.5-25 MCG/ACT AEPB Inhale 1 puff into the  lungs daily. 3 each 3 11/12/2022   loratadine (CLARITIN) 10 MG tablet Take 10 mg by mouth daily.   11/12/2022   metoprolol succinate (TOPROL XL) 25 MG 24 hr tablet Take 1 tablet (25 mg total) by mouth daily. 30 tablet 1 11/12/2022   omeprazole (PRILOSEC) 20 MG capsule Take 20 mg by mouth daily.   11/12/2022   torsemide (DEMADEX) 20 MG tablet Take 2 tablets (40 mg) by mouth once daily. You may take an extra 2 tablets (40 mg) in the afternoon as needed for leg swelling 270 tablet 3 11/12/2022   venlafaxine XR (EFFEXOR-XR) 150 MG 24 hr capsule TAKE ONE CAPSULE BY MOUTH EVERY DAY 90 capsule 1 11/12/2022   Scheduled:   amiodarone  400 mg Oral BID   atorvastatin  40 mg Oral Daily   buPROPion  150 mg Oral Daily   calcium carbonate  1 tablet Oral TID WC   carvedilol  6.25 mg Oral BID WC   dapagliflozin propanediol  10 mg Oral Daily   fluticasone furoate-vilanterol  1 puff Inhalation Daily   And   umeclidinium bromide  1 puff Inhalation Daily   isosorbide-hydrALAZINE  1 tablet Oral TID   loratadine  10 mg Oral Daily   pantoprazole  40 mg Oral Daily   sodium chloride flush  10-40 mL Intracatheter Q12H    sodium chloride flush  3 mL Intravenous Q12H   torsemide  40 mg Oral Daily   venlafaxine XR  150 mg Oral Q breakfast   Infusions:   sodium chloride     sodium chloride Stopped (11/23/22 0913)   PRN: sodium chloride, albuterol, clonazepam, ondansetron (ZOFRAN) IV, senna-docusate, sodium chloride flush Anti-infectives (From admission, onward)    None       Assessment: 13 YOF admitted for CHF presenting to ED with CC of symptomatic anemia. PMH includes HFpEF, paroxysmal Afib, subarachnoid hemorrhage secondary to mechanical fall, CKD stage 4, HLD, GERD, HTN, COPD. Patient takes apixaban 2.5 mg BID for Afib. Per provider, the plan is to stop apixaban and start heparin infusion. The last dose of apixaban was 3/28 at 0914. CHADSVASc 4.  Pharmacy is being consulted for initiation of heparin infusion for afib while holding apixaban.   Heparin level is still falsely elevated due to prior anticoagulation with apixaban. Using aPTT monitoring until aPTT and heparin levels correlate.   Heparin monitoring: 0329 0654 aPTT 35s, subtherapeutic  0329 1605 aPTT 33s, subtherapeutic increase heparin infusion to 900 units/hr. 0330 0208 aPTT 36s,  HL 0.9  inc to 1100 u/hr 03/30 1143 aPTT 142  SUPRAtherapeutic, hold drip x 1 hour and decrease to 950 u/hr  03/30 2009 aPTT 177, supratherapeutic @ 950 units/hr>>hold x 1 hour and reduce to 850 unit/hr   Goal of Therapy:  Heparin level 0.3-0.5 units/ml when heparin level and aPTT correlate.  aPTT 66-85 seconds Monitor platelets by anticoagulation protocol: Yes   Plan:  Per consult: Do not bolus and use lower therapeutic range for monitoring due to possible GI bleed.   3/31: aPTT @ 0331 = 148, elevated - - Heparin drip held 3/31 at 0327 for colonscopy. - Switch to heparin level monitoring once aPTT and heparin level correlate.   - Will need to f/u plans for anticoag after colonoscopy.    Orene Desanctis, PharmD Clinical Pharmacist   11/24/2022,4:19  AM

## 2022-11-24 NOTE — Progress Notes (Signed)
Mobility Specialist - Progress Note   11/24/22 0847  Mobility  Activity Off unit   Pt off unit on attempt. Will re attempt late this date.   Gretchen Short  Mobility Specialist  11/24/22 8:47 AM

## 2022-11-24 NOTE — Progress Notes (Signed)
[facility]  ROUNDING NOTE   Subjective:   Ms. FLORAMAE GONTHIER was admitted to [facility] on 11/12/2022 for Hypoxia [R09.02] Symptomatic anemia [D64.9] Anemia, unspecified type [D64.9] Dyspnea, unspecified type [R06.00]  Patient was last seen by nephrology on 11/20/2021 for her chronic kidney disease by Dr. [name].   EGD yesterday and Colonoscopy for today.   Husband at bedside   Objective:  Vital signs in last 24 hours:  Temp:  [97.1 F (36.2 C)-98.4 F (36.9 C)] 98 F (36.7 C) (03/31 0946) Pulse Rate:  [61-75] 72 (03/31 1100) Resp:  [13-22] 16 (03/31 0939) BP: (88-136)/(44-99) 125/99 (03/31 1100) SpO2:  [90 %-100 %] 96 % (03/31 0939) Weight:  [58.2 kg] 58.2 kg (03/31 0437)  Weight change: 0.5 kg Filed Weights   11/22/22 0414 11/23/22 0427 11/24/22 0437  Weight: 54.9 kg 57.7 kg 58.2 kg    Intake/Output: I/O last 3 completed shifts: In: 1431.1 [P.O.:720; I.V.:711.1] Out: -    Intake/Output this shift:  Total I/O In: 400 [I.V.:400] Out: -   Physical Exam: General: NAD, sitting up in bed  Head: Normocephalic, atraumatic. Moist oral mucosal membranes  Eyes: Anicteric  Lungs:  Clear to auscultation, normal effort  Heart: Regular  Abdomen:  Soft, nontender, nondistended  Extremities: No peripheral edema.  Neurologic: Nonfocal, moving all four extremities  Skin: No lesions    Basic Metabolic Panel: Recent Labs  Lab 11/18/22 0429 11/19/22 0452 11/20/22 0613 11/21/22 1118 11/22/22 0654 11/23/22 0208 11/23/22 1143 11/24/22 0331  NA 133* 132* 135 134* 134* 132*  --  134*  K 4.3 4.2 4.7 4.5 4.8 5.3* 5.8* 4.8  CL 94* 98 100 98 100 100  --  104  CO2 27 26 27 25 26 23   --  23  GLUCOSE 91 160* 109* 207* 97 87  --  88  BUN 86* 76* 73* 64* 67* 61*  --  46*  CREATININE 3.19* 2.91* 2.82* 2.66* 2.76* 2.53*  --  2.31*  CALCIUM 8.8* 8.5* 8.4* 8.7* 8.3* 8.4*  --  8.6*  MG 2.2 2.2 2.1  --   --   --   --   --      Liver Function Tests: No results for  input(s): "AST", "ALT", "ALKPHOS", "BILITOT", "PROT", "ALBUMIN" in the last 168 hours.  No results for input(s): "LIPASE", "AMYLASE" in the last 168 hours. No results for input(s): "AMMONIA" in the last 168 hours.  CBC: Recent Labs  Lab 11/22/22 0654 11/23/22 0208 11/24/22 0331  WBC 8.6 6.4 6.6  HGB 9.1* 8.3* 8.4*  HCT 29.5* 27.4* 27.9*  MCV 84.3 85.4 85.8  PLT 268 214 226     Cardiac Enzymes: No results for input(s): "CKTOTAL", "CKMB", "CKMBINDEX", "TROPONINI" in the last 168 hours.  BNP: Invalid input(s): "POCBNP"  CBG: No results for input(s): "GLUCAP" in the last 168 hours.   Microbiology: Results for orders placed or performed during the hospital encounter of 11/12/22  MRSA Next Gen by PCR, Nasal     Status: None   Collection Time: 11/15/22  2:17 PM   Specimen: Nasal Mucosa; Nasal Swab  Result Value Ref Range Status   MRSA by PCR Next Gen NOT DETECTED NOT DETECTED Final    Comment: (NOTE) The GeneXpert MRSA Assay (FDA approved for NASAL specimens only), is one component of a comprehensive MRSA colonization surveillance program. It is not intended to diagnose MRSA infection nor to guide or monitor treatment for MRSA infections. Test performance is not FDA approved in  patients less than 46 years old. Performed at [facility], [address] [place], [place] [address]     Coagulation Studies: No results for input(s): "LABPROT", "INR" in the last 72 hours.   Urinalysis: No results for input(s): "COLORURINE", "LABSPEC", "PHURINE", "GLUCOSEU", "HGBUR", "BILIRUBINUR", "KETONESUR", "PROTEINUR", "UROBILINOGEN", "NITRITE", "LEUKOCYTESUR" in the last 72 hours.  Invalid input(s): "APPERANCEUR"     Imaging: No results found.   Medications:    sodium chloride     sodium chloride Stopped (11/23/22 0913)   heparin 750 Units/hr (11/24/22 1056)    [START ON 11/25/2022] amiodarone  200 mg Oral Daily   atorvastatin  40 mg Oral Daily   buPROPion  150 mg  Oral Daily   calcium carbonate  1 tablet Oral TID WC   carvedilol  6.25 mg Oral BID WC   dapagliflozin propanediol  10 mg Oral Daily   fluticasone furoate-vilanterol  1 puff Inhalation Daily   And   umeclidinium bromide  1 puff Inhalation Daily   isosorbide-hydrALAZINE  1 tablet Oral TID   loratadine  10 mg Oral Daily   pantoprazole  40 mg Oral Daily   sodium chloride flush  10-40 mL Intracatheter Q12H   sodium chloride flush  3 mL Intravenous Q12H   torsemide  40 mg Oral Daily   venlafaxine XR  150 mg Oral Q breakfast   sodium chloride, albuterol, clonazepam, ondansetron (ZOFRAN) IV, senna-docusate, sodium chloride flush  Assessment/ Plan:  Ms. AMEILYA AMENDT is a 67 y.o.  female with hypertension, atrial fibrillation, chronic congestive heart failure, atrophic right kidney, endometriosis, hyperlipidemia, tobacco abuse, GERD, and depression who is admitted to [facility] on 11/12/2022 for Hypoxia [R09.02] Symptomatic anemia [D64.9] Anemia, unspecified type [D64.9] Dyspnea, unspecified type [R06.00]  Acute kidney injury on Chronic kidney disease stage IV: with hyperkalemia and proteinuria. Baseline creatinine appears to be 2.49 in January. Renal function back to baseline.  - allergy to ARB - Continue dapagliflozin - Continue torsemide.  - Will need IVF prior to left heart catheterization.   Hypertension and acute exacerbation of systolic congestive heart failure: echocardiogram with EF of 25-30%.  - Appreciate cardiology input.  Tentatively scheduled for left heart catheterization later this week.  -Continue torsemide 40 mg daily, carvedilol, and Bidil.   Anemia with chronic kidney disease and iron deficiency:  Hemoglobin 9.1.IV iron given on 11/13/22  - appreciate GI input.  Incomplete bowel prep for colonoscopy. Will need outpatient followup.   Secondary Hyperparathyroidism: with hypocalcemia. PTH 92 from 11/13/2021 - continue calcium carbonate.    LOS: 12 Chenel Wernli 3/31/202412:46 PM

## 2022-11-25 ENCOUNTER — Encounter
Admission: EM | Disposition: A | Discharge status: Home Health | Payer: Self-pay | Source: Home / Self Care | Attending: Internal Medicine

## 2022-11-25 ENCOUNTER — Encounter: Payer: Self-pay | Admitting: Gastroenterology

## 2022-11-25 DIAGNOSIS — I5043 Acute on chronic combined systolic (congestive) and diastolic (congestive) heart failure: Secondary | ICD-10-CM | POA: Diagnosis not present

## 2022-11-25 HISTORY — PX: RIGHT/LEFT HEART CATH AND CORONARY ANGIOGRAPHY: CATH118266

## 2022-11-25 LAB — BASIC METABOLIC PANEL
Anion gap: 7 (ref 5–15)
BUN: 51 mg/dL — ABNORMAL HIGH (ref 8–23)
CO2: 22 mmol/L (ref 22–32)
Calcium: 8.4 mg/dL — ABNORMAL LOW (ref 8.9–10.3)
Chloride: 105 mmol/L (ref 98–111)
Creatinine, Ser: 2.69 mg/dL — ABNORMAL HIGH (ref 0.44–1.00)
GFR, Estimated: 19 mL/min — ABNORMAL LOW (ref >60)
Glucose, Bld: 95 mg/dL (ref 70–99)
Potassium: 5.7 mmol/L — ABNORMAL HIGH (ref 3.5–5.1)
Sodium: 134 mmol/L — ABNORMAL LOW (ref 135–145)

## 2022-11-25 LAB — POCT I-STAT 7, (LYTES, BLD GAS, ICA,H+H)
Acid-base deficit: 5 mmol/L — ABNORMAL HIGH (ref 0.0–2.0)
Bicarbonate: 21.6 mmol/L (ref 20.0–28.0)
Calcium, Ion: 1.27 mmol/L (ref 1.15–1.40)
HCT: 26 % — ABNORMAL LOW (ref 36.0–46.0)
Hemoglobin: 8.8 g/dL — ABNORMAL LOW (ref 12.0–15.0)
O2 Saturation: 89 %
Potassium: 4.8 mmol/L (ref 3.5–5.1)
Sodium: 136 mmol/L (ref 135–145)
TCO2: 23 mmol/L (ref 22–32)
pCO2 arterial: 44 mmHg (ref 32–48)
pH, Arterial: 7.299 — ABNORMAL LOW (ref 7.35–7.45)
pO2, Arterial: 63 mmHg — ABNORMAL LOW (ref 83–108)

## 2022-11-25 LAB — CBC
HCT: 26.8 % — ABNORMAL LOW (ref 36.0–46.0)
Hemoglobin: 8 g/dL — ABNORMAL LOW (ref 12.0–15.0)
MCH: 26.1 pg (ref 26.0–34.0)
MCHC: 29.9 g/dL — ABNORMAL LOW (ref 30.0–36.0)
MCV: 87.6 fL (ref 80.0–100.0)
Platelets: 213 10*3/uL (ref 150–400)
RBC: 3.06 MIL/uL — ABNORMAL LOW (ref 3.87–5.11)
RDW: 22.6 % — ABNORMAL HIGH (ref 11.5–15.5)
WBC: 8.5 10*3/uL (ref 4.0–10.5)
nRBC: 0 % (ref 0.0–0.2)

## 2022-11-25 LAB — POCT I-STAT EG7
Acid-base deficit: 5 mmol/L — ABNORMAL HIGH (ref 0.0–2.0)
Bicarbonate: 22 mmol/L (ref 20.0–28.0)
Calcium, Ion: 1.29 mmol/L (ref 1.15–1.40)
HCT: 26 % — ABNORMAL LOW (ref 36.0–46.0)
Hemoglobin: 8.8 g/dL — ABNORMAL LOW (ref 12.0–15.0)
O2 Saturation: 59 %
Potassium: 4.9 mmol/L (ref 3.5–5.1)
Sodium: 136 mmol/L (ref 135–145)
TCO2: 23 mmol/L (ref 22–32)
pCO2, Ven: 46.9 mmHg (ref 44–60)
pH, Ven: 7.28 (ref 7.25–7.43)
pO2, Ven: 35 mmHg (ref 32–45)

## 2022-11-25 LAB — HEPARIN LEVEL (UNFRACTIONATED)
Heparin Unfractionated: 0.43 IU/mL (ref 0.30–0.70)
Heparin Unfractionated: 0.45 IU/mL (ref 0.30–0.70)

## 2022-11-25 LAB — APTT: aPTT: 70 seconds — ABNORMAL HIGH (ref 24–36)

## 2022-11-25 SURGERY — RIGHT/LEFT HEART CATH AND CORONARY ANGIOGRAPHY
Anesthesia: Moderate Sedation

## 2022-11-25 MED ORDER — SODIUM CHLORIDE 0.9% FLUSH
3.0000 mL | Freq: Two times a day (BID) | INTRAVENOUS | Status: DC
  Administered 2022-11-25 – 2022-11-26 (×2): 3 mL via INTRAVENOUS

## 2022-11-25 MED ORDER — IOHEXOL 300 MG/ML  SOLN
INTRAMUSCULAR | Status: DC | PRN
  Administered 2022-11-25: 15 mL

## 2022-11-25 MED ORDER — SODIUM CHLORIDE 0.9% FLUSH: 3.0000 mL | INTRAVENOUS | Status: DC | PRN

## 2022-11-25 MED ORDER — MIDAZOLAM HCL 2 MG/2ML IJ SOLN
INTRAMUSCULAR | Status: AC
  Filled 2022-11-25: qty 2

## 2022-11-25 MED ORDER — APIXABAN 2.5 MG PO TABS
2.5000 mg | ORAL_TABLET | Freq: Two times a day (BID) | ORAL | Status: DC
  Administered 2022-11-25 – 2022-11-26 (×2): 2.5 mg via ORAL
  Filled 2022-11-25 (×2): qty 1

## 2022-11-25 MED ORDER — MIDAZOLAM HCL 2 MG/2ML IJ SOLN
INTRAMUSCULAR | Status: DC | PRN
  Administered 2022-11-25: 1 mg via INTRAVENOUS

## 2022-11-25 MED ORDER — ASPIRIN 81 MG PO CHEW
CHEWABLE_TABLET | ORAL | Status: AC
  Filled 2022-11-25: qty 1

## 2022-11-25 MED ORDER — ASPIRIN 81 MG PO CHEW
81.0000 mg | CHEWABLE_TABLET | Freq: Once | ORAL | Status: AC
  Administered 2022-11-25: 81 mg via ORAL

## 2022-11-25 MED ORDER — HEPARIN SODIUM (PORCINE) 1000 UNIT/ML IJ SOLN
INTRAMUSCULAR | Status: DC | PRN
  Administered 2022-11-25: 2500 [IU] via INTRAVENOUS
  Administered 2022-11-25: 2000 [IU] via INTRAVENOUS

## 2022-11-25 MED ORDER — HEPARIN (PORCINE) IN NACL 1000-0.9 UT/500ML-% IV SOLN
INTRAVENOUS | Status: AC
  Filled 2022-11-25: qty 1000

## 2022-11-25 MED ORDER — HEPARIN SODIUM (PORCINE) 1000 UNIT/ML IJ SOLN
INTRAMUSCULAR | Status: AC
  Filled 2022-11-25: qty 10

## 2022-11-25 MED ORDER — VERAPAMIL HCL 2.5 MG/ML IV SOLN
INTRAVENOUS | Status: AC
  Filled 2022-11-25: qty 2

## 2022-11-25 MED ORDER — SODIUM CHLORIDE 0.9 % IV SOLN: 250.0000 mL | INTRAVENOUS | Status: DC | PRN

## 2022-11-25 MED ORDER — VERAPAMIL HCL 2.5 MG/ML IV SOLN
INTRAVENOUS | Status: DC | PRN
  Administered 2022-11-25: 2.5 mg via INTRA_ARTERIAL

## 2022-11-25 MED ORDER — FENTANYL CITRATE (PF) 100 MCG/2ML IJ SOLN
INTRAMUSCULAR | Status: DC | PRN
  Administered 2022-11-25: 25 ug via INTRAVENOUS

## 2022-11-25 MED ORDER — FENTANYL CITRATE (PF) 100 MCG/2ML IJ SOLN
INTRAMUSCULAR | Status: AC
  Filled 2022-11-25: qty 2

## 2022-11-25 MED ORDER — ACETAMINOPHEN 325 MG PO TABS
650.0000 mg | ORAL_TABLET | ORAL | Status: DC | PRN
  Administered 2022-11-25 – 2022-11-26 (×2): 650 mg via ORAL
  Filled 2022-11-25 (×2): qty 2

## 2022-11-25 MED ORDER — SODIUM ZIRCONIUM CYCLOSILICATE 10 G PO PACK
10.0000 g | PACK | Freq: Once | ORAL | Status: AC
  Administered 2022-11-25: 10 g via ORAL
  Filled 2022-11-25: qty 1

## 2022-11-25 SURGICAL SUPPLY — 12 items
CATH BALLN WEDGE 5F 110CM (CATHETERS) IMPLANT
CATH INFINITI 5FR JK (CATHETERS) IMPLANT
DEVICE RAD TR BAND REGULAR (VASCULAR PRODUCTS) IMPLANT
DRAPE BRACHIAL (DRAPES) IMPLANT
GLIDESHEATH SLEND SS 6F .021 (SHEATH) IMPLANT
GUIDEWIRE INQWIRE 1.5J.035X260 (WIRE) IMPLANT
INQWIRE 1.5J .035X260CM (WIRE) ×1
PACK CARDIAC CATH (CUSTOM PROCEDURE TRAY) ×1 IMPLANT
PROTECTION STATION PRESSURIZED (MISCELLANEOUS) ×1
SET ATX-X65L (MISCELLANEOUS) IMPLANT
SHEATH GLIDE SLENDER 4/5FR (SHEATH) IMPLANT
STATION PROTECTION PRESSURIZED (MISCELLANEOUS) IMPLANT

## 2022-11-25 NOTE — TOC Progression Note (Signed)
Transition of Care [facility]) - Progression Note    Patient Details  Name: Dawn Sherman MRN: [id number] Date of Birth: [birth year]  [facility] CM/SW Contact  Laurena Slimmer, RN Phone Number: 11/25/2022, 4:33 PM  Clinical Narrative:    Case reviewed for DME needs and changes in discharge disposition.    Expected Discharge Plan: [place] Barriers to Discharge: Continued Medical Work up  Expected Discharge Plan and Services       Living arrangements for the past 2 months: Single Family Home                                       Social Determinants of Health (SDOH) Interventions SDOH Screenings   Food Insecurity: No Food Insecurity (11/12/2022)  Housing: Low Risk  (11/12/2022)  Transportation Needs: No Transportation Needs (11/12/2022)  Utilities: Not At Risk (11/12/2022)  Depression (PHQ2-9): Low Risk  (10/09/2022)  Financial Resource Strain: Low Risk  (11/27/2021)  Physical Activity: Sufficiently Active (11/27/2021)  Social Connections: Unknown (11/27/2021)  Stress: No Stress Concern Present (11/27/2021)  Tobacco Use: Medium Risk (11/25/2022)    Readmission Risk Interventions    07/02/2022   12:26 PM 12/11/2021    3:17 PM  Readmission Risk Prevention Plan  Transportation Screening Complete Complete  PCP or Specialist Appt within 3-5 Days  Complete  HRI or [place]  Complete  Social Work Consult for [place] Planning/Counseling  Complete  Palliative Care Screening  Not Applicable  Medication Review [employer] photographer) Complete Complete  PCP or Specialist appointment within 3-5 days of discharge Complete   SW Recovery Care/Counseling Consult Complete   [place] Not Applicable

## 2022-11-25 NOTE — Plan of Care (Signed)

## 2022-11-25 NOTE — Consult Note (Addendum)
Advanced Heart Failure Team Consult Note   Primary Physician: [name], MD PCP-Cardiologist:  Ida Rogue, MD  Reason for Consultation: Heart failure  HPI:    Dawn Sherman is seen today for evaluation of heart failure at the request of Dr. Fletcher Anon.   Dawn Sherman is a 67 y.o. female with a history of COPD, prior tobacco abuse, paroxysmal atrial fibrillation and flutter, moderate mitral regurgitation, hypertension, hyperlipidemia,  stage IV chronic kidney disease, and systolic HF.    HF diagnosed in 6/21. Echo EF 30-35% in setting of new-onset AF.  Repeat echo in December 2021 showed improvement in EF to 50-55% with grade 1 diastolic dysfunction.  In April 2023, in the context of GI illness and weakness, she suffered a fall was found to have a subarachnoid hemorrhage.  Oral anticoagulation was initially held but then subsequently resumed once cleared by neurosurgery.    She required admissions in July 2023 and again in November 2023 in the setting of respiratory failure and volume overload.  Echo November 2023 showed an EF of 50 to 55%.  Unfortunately, she was readmitted in January 2024 with heart failure and pneumonia.  Repeat echo 09/28/22, which was performed during an episode of atrial flutter, showed an EF of 35 to 40%.  Following diuresis, she was discharged home.  Stress test performed in the outpatient setting was intermediate in the setting of an EF of 43% with apical infarct and mild peri-infarct ischemia.  No cath at that time due to CKD 4.   Admitted 11/14/22 with progressive weight gain and SOB. Labs showed acute on chronic normocytic anemia with an H&H of 7.1 and 23.9.  BNP was elevated at 3619.9.  Creatinine stable at 2.76 however, potassium was elevated at 5.5. CXR with vascular congestion and small bilateral pleural effusions.  ECG with NSR. Hstrop 9 and 10.  Echo 11/14/22: EF 25-30% with global Hk, RV moderately down. Severe MR, mod-severe TR  Since admit has had  EGC/colon. EGD negative. Colon notable only for internal hemorrhoids.   Has diuresed 4 pounds since admission. Scr fluctuates 2.3-2.8. Potassium > 5. Remains SOB. Denies CP.   Pending R/L cath today  Review of Systems: [y] = yes, [ ]  = no   General: Weight gain [ ] ; Weight loss [ ] ; Anorexia [ ] ; Fatigue Blue.Reese ]; Fever [ ] ; Chills [ ] ; Weakness [ ]   Cardiac: Chest pain/pressure [ ] ; Resting SOB [ y]; Exertional SOB Blue.Reese ]; Orthopnea [ ] ; Pedal Edema [ ] ; Palpitations [ ] ; Syncope [ ] ; Presyncope [ ] ; Paroxysmal nocturnal dyspnea[ ]   Pulmonary: Cough [ ] ; Wheezing[ ] ; Hemoptysis[ ] ; Sputum [ ] ; Snoring [ ]   GI: Vomiting[ ] ; Dysphagia[ ] ; Melena[ ] ; Hematochezia [ ] ; Heartburn[ ] ; Abdominal pain [ ] ; Constipation [ ] ; Diarrhea [ ] ; BRBPR [ ]   GU: Hematuria[ ] ; Dysuria [ ] ; Nocturia[ ]   Vascular: Pain in legs with walking [ ] ; Pain in feet with lying flat [ ] ; Non-healing sores [ ] ; Stroke [ ] ; TIA [ ] ; Slurred speech [ ] ;  Neuro: Headaches[ ] ; Vertigo[ ] ; Seizures[ ] ; Paresthesias[ ] ;Blurred vision [ ] ; Diplopia [ ] ; Vision changes [ ]   Ortho/Skin: Arthritis [ y]; Joint pain [ y]; Muscle pain [ ] ; Joint swelling [ ] ; Back Pain [ ] ; Rash [ ]   Psych: Depression[y ]; Anxiety[y ]  Heme: Bleeding problems [ ] ; Clotting disorders [ ] ; Anemia [ ]   Endocrine: Diabetes [ ] ; Thyroid dysfunction[ ]   Home Medications  Prior to Admission medications   Medication Sig Start Date End Date Taking? Authorizing Provider  acetaminophen (TYLENOL) 650 MG CR tablet Take 650 mg by mouth every 8 (eight) hours as needed for pain or fever.   Yes [provider]  albuterol (VENTOLIN HFA) 108 (90 Base) MCG/ACT inhaler Inhale 2 puffs into the lungs every 6 (six) hours as needed for wheezing or shortness of breath. 09/18/22  Yes [name], MD  amiodarone (PACERONE) 200 MG tablet Take 1 tablet (200 mg total) by mouth daily. 09/12/22  Yes Duard Brady, MD  apixaban (ELIQUIS) 2.5 MG TABS tablet Take 1 tablet (2.5 mg  total) by mouth 2 (two) times daily. 04/08/22  Yes Minna Merritts, MD  atorvastatin (LIPITOR) 40 MG tablet Take 1 tablet (40 mg total) by mouth daily. 10/09/22  Yes [name], MD  buPROPion (WELLBUTRIN XL) 150 MG 24 hr tablet Take 1 tablet (150 mg total) by mouth daily. 10/09/22  Yes [name], MD  calcium carbonate (OS-CAL - DOSED IN MG OF ELEMENTAL CALCIUM) 1250 (500 Ca) MG tablet Take 1 tablet (500 mg of elemental calcium total) by mouth 3 (three) times daily with meals. 12/14/21  Yes Fritzi Mandes, MD  FARXIGA 10 MG TABS tablet Take 10 mg by mouth daily. 11/20/21  Yes [provider]  Fluticasone-Umeclidin-Vilant (TRELEGY ELLIPTA) 100-62.5-25 MCG/ACT AEPB Inhale 1 puff into the lungs daily. 03/05/22  Yes [name], MD  loratadine (CLARITIN) 10 MG tablet Take 10 mg by mouth daily.   Yes [provider]  metoprolol succinate (TOPROL XL) 25 MG 24 hr tablet Take 1 tablet (25 mg total) by mouth daily. 11/12/22  Yes Furth, Cadence H, PA-C  omeprazole (PRILOSEC) 20 MG capsule Take 20 mg by mouth daily.   Yes [provider]  torsemide (DEMADEX) 20 MG tablet Take 2 tablets (40 mg) by mouth once daily. You may take an extra 2 tablets (40 mg) in the afternoon as needed for leg swelling 10/08/22  Yes Minna Merritts, MD  venlafaxine XR (EFFEXOR-XR) 150 MG 24 hr capsule TAKE ONE CAPSULE BY MOUTH EVERY DAY 10/09/22  Yes [name], MD    Past Medical History: Past Medical History:  Diagnosis Date   B12 deficiency    Cardiomyopathy    a. 09/2022 Echo: EF 35-40%; b. 09/2022 MV: apical defect w/ mild peri-infarct ischemia. EF 43%.   Chronic HFrEF (heart failure with reduced ejection fraction)    a. 01/2020 Echo: EF 30-35%; b. 07/2020 Echo: EF 50-55%; c. 06/2022 Echo: EF 50-55%; d. 09/2022 Echo: EF 35-40%, globh HK, mod reduced RV fxn, sev BAE, mod MR.   CKD (chronic kidney disease), stage IV    COPD (chronic obstructive pulmonary disease)    Depression     Endometriosis    Hypertension    Mixed hyperlipidemia    Moderate mitral regurgitation    Normocytic anemia    PAF (paroxysmal atrial fibrillation)    a. CHA2DS2VASc = 4-->eliquis 2.5 bid.   Paroxymsal Atrial flutter ([place])    Tobacco abuse     Past Surgical History: Past Surgical History:  Procedure Laterality Date   ABDOMINAL HYSTERECTOMY     BREAST BIOPSY Right 2015   neg-  FIBROADENOMA   CENTRAL LINE INSERTION  11/15/2022   Procedure: CENTRAL LINE INSERTION;  Surgeon: Nelva Bush, MD;  Location: [facility];  Service: Cardiovascular;;   COLONOSCOPY WITH PROPOFOL N/A 11/24/2022   Procedure: COLONOSCOPY WITH PROPOFOL;  Surgeon: Lesly Rubenstein, MD;  Location: [facility];  Service: Endoscopy;  Laterality: N/A;   COLONOSCOPY WITH PROPOFOL N/A 11/23/2022   Procedure: COLONOSCOPY WITH PROPOFOL;  Surgeon: Lesly Rubenstein, MD;  Location: [facility];  Service: Endoscopy;  Laterality: N/A;   ESOPHAGOGASTRODUODENOSCOPY (EGD) WITH PROPOFOL N/A 11/23/2022   Procedure: ESOPHAGOGASTRODUODENOSCOPY (EGD) WITH PROPOFOL;  Surgeon: Lesly Rubenstein, MD;  Location: [facility];  Service: Endoscopy;  Laterality: N/A;   RIGHT HEART CATH N/A 11/15/2022   Procedure: RIGHT HEART CATH;  Surgeon: Nelva Bush, MD;  Location: [facility];  Service: Cardiovascular;  Laterality: N/A;   TAH/RSO  1999   secondary to bleeding and endometriosis (Dr Vernie Ammons)    Family History: Family History  Problem Relation Age of Onset   Hypercholesterolemia Mother    Rheum arthritis Father    Rheum arthritis Daughter    Fibromyalgia Daughter    Breast cancer Neg Hx    Colon cancer Neg Hx     Social History: Social History   Socioeconomic History   Marital status: Married    Spouse name: Not on file   Number of children: Not on file   Years of education: Not on file   Highest education level: Not on file  Occupational History   Not on file  Tobacco Use   Smoking  status: Former    Packs/day: 0.25    Years: 40.00    Additional pack years: 0.00    Total pack years: 10.00    Types: Cigarettes   Smokeless tobacco: Never   Tobacco comments:    Patient quit smoking recently   Vaping Use   Vaping Use: Never used  Substance and Sexual Activity   Alcohol use: Not Currently    Alcohol/week: 0.0 standard drinks of alcohol    Comment: occasional   Drug use: No   Sexual activity: Not Currently  Other Topics Concern   Not on file  Social History Narrative   Lives in [place] with her husband.  She is retired from [employer].  She does not routinely exercise.   Social Determinants of Health   Financial Resource Strain: Low Risk  (11/27/2021)   Overall Financial Resource Strain (CARDIA)    Difficulty of Paying Living Expenses: Not hard at all  Food Insecurity: No Food Insecurity (11/12/2022)   Hunger Vital Sign    Worried About Running Out of Food in the Last Year: Never true    Ran Out of Food in the Last Year: Never true  Transportation Needs: No Transportation Needs (11/12/2022)   PRAPARE - Hydrologist (Medical): No    Lack of Transportation (Non-Medical): No  Physical Activity: Sufficiently Active (11/27/2021)   Exercise Vital Sign    Days of Exercise per Week: 7 days    Minutes of Exercise per Session: 30 min  Stress: No Stress Concern Present (11/27/2021)   [place]    Feeling of Stress : Not at all  Social Connections: Unknown (11/27/2021)   Social Connection and Isolation Panel [NHANES]    Frequency of Communication with Friends and Family: Not on file    Frequency of Social Gatherings with Friends and Family: Not on file    Attends Religious Services: Not on file    Active Member of Clubs or Organizations: Not on file    Attends Archivist Meetings: Not on file    Marital Status: Married    Allergies:  Allergies  Allergen Reactions   Sulfate Rash   Codeine Sulfate Nausea Only   Benicar [Olmesartan]     Talked with patient February 10, 2020, intolerance is unclear, tried several medications around that time and one of them gave her a rash but she is not clear which 1.   Amoxicillin Rash   Clindamycin/Lincomycin Rash   Entresto [Sacubitril-Valsartan] Other (See Comments)    hyperkalemia   Morphine And Related Rash   Penicillins Rash    Objective:    Vital Signs:   Temp:  [97.5 F (36.4 C)-98.2 F (36.8 C)] 97.8 F (36.6 C) (04/01 1059) Pulse Rate:  [68-82] 68 (04/01 1059) Resp:  [18] 18 (04/01 1059) BP: (116-147)/(52-80) 138/71 (04/01 1059) SpO2:  [92 %-98 %] 97 % (04/01 1059) Weight:  [56.4 kg-58.5 kg] 58.5 kg (04/01 0551) Last BM Date : 11/24/22  Weight change: Filed Weights   11/24/22 0437 11/25/22 0417 11/25/22 0551  Weight: 58.2 kg 56.4 kg 58.5 kg    Intake/Output:   Intake/Output Summary (Last 24 hours) at 11/25/2022 1134 Last data filed at 11/25/2022 0800 Gross per 24 hour  Intake 168.45 ml  Output --  Net 168.45 ml      Physical Exam    General:  Chronically ill appearing No resp difficulty HEENT: normal Neck: supple. JVP hard to see Carotids 2+ bilat; no bruits. No lymphadenopathy or thyromegaly appreciated. Cor: PMI nondisplaced. Regular rate & rhythm. No rubs, gallops or murmurs. Lungs: decreased throughout  Abdomen: soft, nontender, nondistended. No hepatosplenomegaly. No bruits or masses. Good bowel sounds. Extremities: no cyanosis, clubbing, rash, edema Neuro: alert & orientedx3, cranial nerves grossly intact. moves all 4 extremities w/o difficulty. Affect pleasant   Telemetry   Sinus 60-70s Personally reviewed  EKG    Sinus 70 1AVB 275ms diffuse ST-T wave abnl suggestive of repol Personally reviewed  Labs   Basic Metabolic Panel: Recent Labs  Lab 11/19/22 0452 11/20/22 0613 11/21/22 1118 11/22/22 0654 11/23/22 0208 11/23/22 1143 11/24/22 0331  11/25/22 0138  NA 132* 135 134* 134* 132*  --  134* 134*  K 4.2 4.7 4.5 4.8 5.3* 5.8* 4.8 5.7*  CL 98 100 98 100 100  --  104 105  CO2 26 27 25 26 23   --  23 22  GLUCOSE 160* 109* 207* 97 87  --  88 95  BUN 76* 73* 64* 67* 61*  --  46* 51*  CREATININE 2.91* 2.82* 2.66* 2.76* 2.53*  --  2.31* 2.69*  CALCIUM 8.5* 8.4* 8.7* 8.3* 8.4*  --  8.6* 8.4*  MG 2.2 2.1  --   --   --   --   --   --     Liver Function Tests: No results for input(s): "AST", "ALT", "ALKPHOS", "BILITOT", "PROT", "ALBUMIN" in the last 168 hours. No results for input(s): "LIPASE", "AMYLASE" in the last 168 hours. No results for input(s): "AMMONIA" in the last 168 hours.  CBC: Recent Labs  Lab 11/22/22 0654 11/23/22 0208 11/24/22 0331 11/25/22 0138  WBC 8.6 6.4 6.6 8.5  HGB 9.1* 8.3* 8.4* 8.0*  HCT 29.5* 27.4* 27.9* 26.8*  MCV 84.3 85.4 85.8 87.6  PLT 268 214 226 213    Cardiac Enzymes: No results for input(s): "CKTOTAL", "CKMB", "CKMBINDEX", "TROPONINI" in the last 168 hours.  BNP: BNP (last 3 results) Recent Labs    09/27/22 1026 11/12/22 1125 11/12/22 1357  BNP >4,500.0* 3,619.9* 3,247.0*    ProBNP (last 3 results) No results for input(s): "PROBNP" in the  last 8760 hours.   CBG: No results for input(s): "GLUCAP" in the last 168 hours.  Coagulation Studies: No results for input(s): "LABPROT", "INR" in the last 72 hours.   Imaging   No results found.   Medications:     Current Medications:  [MAR Hold] amiodarone  200 mg Oral Daily   aspirin       [MAR Hold] atorvastatin  40 mg Oral Daily   [MAR Hold] buPROPion  150 mg Oral Daily   [MAR Hold] calcium carbonate  1 tablet Oral TID WC   [MAR Hold] carvedilol  6.25 mg Oral BID WC   [MAR Hold] dapagliflozin propanediol  10 mg Oral Daily   [MAR Hold] fluticasone furoate-vilanterol  1 puff Inhalation Daily   And   [MAR Hold] umeclidinium bromide  1 puff Inhalation Daily   [MAR Hold] isosorbide-hydrALAZINE  1 tablet Oral TID   [MAR  Hold] loratadine  10 mg Oral Daily   [MAR Hold] pantoprazole  40 mg Oral Daily   [MAR Hold] sodium chloride flush  10-40 mL Intracatheter Q12H   [MAR Hold] sodium chloride flush  3 mL Intravenous Q12H   sodium chloride flush  3 mL Intravenous Q12H   [MAR Hold] torsemide  40 mg Oral Daily   [MAR Hold] venlafaxine XR  150 mg Oral Q breakfast    Infusions:  [MAR Hold] sodium chloride     sodium chloride Stopped (11/23/22 0913)   sodium chloride     sodium chloride 10 mL/hr at 11/25/22 0700   heparin Stopped (11/25/22 1108)     Assessment/Plan   1. Acute on chronic diastolic and systolic CHF - HF diagnosed in 6/21. Echo EF 30-35% in setting of new-onset AF. - Echo 12/21  EF 50-55%  G1DD - Echo 11/23  EF 50-55%.  Unfortunately, she was readmitted in January 2024 with - - - Echo 09/28/22, EF 35-40% performed during an episode of atrial flutter, showed an EF of 35 to 40%.  Following diuresis, she was discharged home.   - Myoview 2/24 EF of 43% with apical infarct and mild peri-infarct ischemia.  No cath at that time due to CKD 4.  - Echo 11/14/22: EF 25-30% with global Hk, RV moderately down. Severe MR, mod-severe TR - NYHA III-IIIB - Volume status hard to assess on exam. Volume status doesn't look too bad - Plan for R/L Cath today to guide further management  - Continue carvedilol 6.25 bid - Continue Farxiga 10 - Continue bidil 1 tab tida - No ARB/ARNI/MRA with CKD 4   2. Paroxysmal atrial fibrillation/flutter - Currently normal sinus rhythm on low-dose amiodarone, beta-blocker - Eliquis on hold for now, restart after catheterization  3. Acute on chronic anemia - In the setting of renal failure - Management per primary team  4. Chronic kidney disease stage IV - Baseline SCr 2.5-2.8 - Continue Farxiga - Marked right kidney atrophy on u/s. Biateral cortical thinning - Consider underlying RAS - possible vascular u/s   5. COPD with ongoing tobacco use - discussed cessation   6.  R>L pleural effusions - suspect contributing to dyspnea - consider diagnostic/therapeutic  tap   Length of Stay: [place], MD  11/25/2022, 11:34 AM  Advanced Heart Failure Team Pager [phone] (M-F; 7a - 5p)  Please contact [facility] for night-coverage after hours (4p -7a ) and weekends on [email or web address]

## 2022-11-25 NOTE — Progress Notes (Signed)
[facility]  ROUNDING NOTE   Subjective:   [name] was admitted to [facility] on 11/12/2022 for Hypoxia [R09.02] Symptomatic anemia [D64.9] Anemia, unspecified type [D64.9] Dyspnea, unspecified type [R06.00]  Patient was last seen by nephrology on 11/20/2021 for her chronic kidney disease by Dr. [name].   Patient seen sitting at side of bed Alert and oriented Currently n.p.o. for cardiac catheterization Remains on 2 L nasal cannula   Objective:  Vital signs in last 24 hours:  Temp:  [97.5 F (36.4 C)-98.2 F (36.8 C)] 97.8 F (36.6 C) (04/01 1059) Pulse Rate:  [68-82] 68 (04/01 1059) Resp:  [18] 18 (04/01 1059) BP: (116-147)/(52-80) 138/71 (04/01 1059) SpO2:  [92 %-98 %] 97 % (04/01 1059) Weight:  [56.4 kg-58.5 kg] 58.5 kg (04/01 0551)  Weight change: -1.8 kg Filed Weights   11/24/22 0437 11/25/22 0417 11/25/22 0551  Weight: 58.2 kg 56.4 kg 58.5 kg    Intake/Output: I/O last 3 completed shifts: In: 610.1 [I.V.:610.1] Out: -    Intake/Output this shift:  No intake/output data recorded.  Physical Exam: General: NAD, sitting at side of bed  Head: Normocephalic, atraumatic. Moist oral mucosal membranes  Eyes: Anicteric  Lungs:  Clear to auscultation, normal effort  Heart: Regular  Abdomen:  Soft, nontender, nondistended  Extremities: No peripheral edema.  Neurologic: Nonfocal, moving all four extremities  Skin: No lesions    Basic Metabolic Panel: Recent Labs  Lab 11/19/22 0452 11/20/22 0613 11/21/22 1118 11/22/22 0654 11/23/22 0208 11/23/22 1143 11/24/22 0331 11/25/22 0138 11/25/22 1400  NA 132* 135 134* 134* 132*  --  134* 134* 136  K 4.2 4.7 4.5 4.8 5.3* 5.8* 4.8 5.7* 4.8  CL 98 100 98 100 100  --  104 105  --   CO2 26 27 25 26 23   --  23 22  --   GLUCOSE 160* 109* 207* 97 87  --  88 95  --   BUN 76* 73* 64* 67* 61*  --  46* 51*  --   CREATININE 2.91* 2.82* 2.66* 2.76* 2.53*  --  2.31* 2.69*  --   CALCIUM 8.5* 8.4* 8.7* 8.3*  8.4*  --  8.6* 8.4*  --   MG 2.2 2.1  --   --   --   --   --   --   --      Liver Function Tests: No results for input(s): "AST", "ALT", "ALKPHOS", "BILITOT", "PROT", "ALBUMIN" in the last 168 hours.  No results for input(s): "LIPASE", "AMYLASE" in the last 168 hours. No results for input(s): "AMMONIA" in the last 168 hours.  CBC: Recent Labs  Lab 11/22/22 0654 11/23/22 0208 11/24/22 0331 11/25/22 0138 11/25/22 1400  WBC 8.6 6.4 6.6 8.5  --   HGB 9.1* 8.3* 8.4* 8.0* 8.8*  HCT 29.5* 27.4* 27.9* 26.8* 26.0*  MCV 84.3 85.4 85.8 87.6  --   PLT 268 214 226 213  --      Cardiac Enzymes: No results for input(s): "CKTOTAL", "CKMB", "CKMBINDEX", "TROPONINI" in the last 168 hours.  BNP: Invalid input(s): "POCBNP"  CBG: No results for input(s): "GLUCAP" in the last 168 hours.   Microbiology: Results for orders placed or performed during the hospital encounter of 11/12/22  MRSA Next Gen by PCR, Nasal     Status: None   Collection Time: 11/15/22  2:17 PM   Specimen: Nasal Mucosa; Nasal Swab  Result Value Ref Range Status   MRSA by PCR Next Gen  NOT DETECTED NOT DETECTED Final    Comment: (NOTE) The GeneXpert MRSA Assay (FDA approved for NASAL specimens only), is one component of a comprehensive MRSA colonization surveillance program. It is not intended to diagnose MRSA infection nor to guide or monitor treatment for MRSA infections. Test performance is not FDA approved in patients less than 39 years old. Performed at [facility], [address] [place], [place] [address]     Coagulation Studies: No results for input(s): "LABPROT", "INR" in the last 72 hours.   Urinalysis: No results for input(s): "COLORURINE", "LABSPEC", "PHURINE", "GLUCOSEU", "HGBUR", "BILIRUBINUR", "KETONESUR", "PROTEINUR", "UROBILINOGEN", "NITRITE", "LEUKOCYTESUR" in the last 72 hours.  Invalid input(s): "APPERANCEUR"     Imaging: No results found.   Medications:    [MAR Hold]  sodium chloride     sodium chloride Stopped (11/23/22 0913)   sodium chloride     sodium chloride 10 mL/hr at 11/25/22 0700   heparin Stopped (11/25/22 1108)    [MAR Hold] amiodarone  200 mg Oral Daily   aspirin       [MAR Hold] atorvastatin  40 mg Oral Daily   [MAR Hold] buPROPion  150 mg Oral Daily   [MAR Hold] calcium carbonate  1 tablet Oral TID WC   [MAR Hold] carvedilol  6.25 mg Oral BID WC   [MAR Hold] dapagliflozin propanediol  10 mg Oral Daily   [MAR Hold] fluticasone furoate-vilanterol  1 puff Inhalation Daily   And   [MAR Hold] umeclidinium bromide  1 puff Inhalation Daily   [MAR Hold] isosorbide-hydrALAZINE  1 tablet Oral TID   [MAR Hold] loratadine  10 mg Oral Daily   [MAR Hold] pantoprazole  40 mg Oral Daily   [MAR Hold] sodium chloride flush  10-40 mL Intracatheter Q12H   [MAR Hold] sodium chloride flush  3 mL Intravenous Q12H   sodium chloride flush  3 mL Intravenous Q12H   [MAR Hold] torsemide  40 mg Oral Daily   [MAR Hold] venlafaxine XR  150 mg Oral Q breakfast   [MAR Hold] sodium chloride, sodium chloride, [MAR Hold] albuterol, aspirin, [MAR Hold] clonazepam, fentaNYL, iohexol, midazolam, [MAR Hold] ondansetron (ZOFRAN) IV, [MAR Hold] senna-docusate, [MAR Hold] sodium chloride flush, sodium chloride flush  Assessment/ Plan:  [name] is a 67 y.o.  female with hypertension, atrial fibrillation, chronic congestive heart failure, atrophic right kidney, endometriosis, hyperlipidemia, tobacco abuse, GERD, and depression who is admitted to [facility] on 11/12/2022 for Hypoxia [R09.02] Symptomatic anemia [D64.9] Anemia, unspecified type [D64.9] Dyspnea, unspecified type [R06.00]  Acute kidney injury on Chronic kidney disease stage IV: with hyperkalemia and proteinuria. Baseline creatinine appears to be 2.49 in January. Renal function back to baseline.  - allergy to ARB - Continue dapagliflozin - Continue torsemide.  -Renal function slightly elevated  overnight. -Will continue to monitor renal function with scheduled LHC  Hypertension and acute exacerbation of systolic congestive heart failure: echocardiogram with EF of 25-30%.  - Appreciate cardiology input.  Tentatively scheduled for left heart catheterization later this week.  -Continue torsemide 40 mg daily, carvedilol, and Bidil.  -Patient scheduled for LHC later today.  Anemia with chronic kidney disease and iron deficiency:  IV iron given on 11/13/22  - appreciate GI input.  EGD and colonoscopy completed on 11/24/2022.  Colonoscopy with poor visibility, but negative for any large masses.  EGD also negative.  Hemoglobin 8.8 today.  Secondary Hyperparathyroidism: with hypocalcemia. PTH 92 from 11/13/2021 - continue calcium carbonate.    LOS: [place]  Husam Hohn 4/1/20242:08 PM

## 2022-11-25 NOTE — Progress Notes (Signed)
PROGRESS NOTE    CELESTA Sherman  [id number]  DOB: 05-30-1956  DOA: 11/12/2022 PCP: [name], MD Outpatient Specialists:   Hospital course:  [name] is a 67 year old female with history of heart failure preserved ejection fraction, paroxysmal atrial fibrillation on low-dose Eliquis, history of subarachnoid hemorrhage secondary to mechanical fall, CKD stage IV, COPD, hypertension, who presents to emergency department from cardiology outpatient clinic for chief concerns of symptomatic anemia.   Vitals in the ED showed temperature of 97.8, respiration rate 18, heart rate 66, blood pressure 116/66, SpO2 of 97% on room air.   Serum sodium is 134, potassium 5.2, chloride 99, bicarb 25, BUN 40, serum creatinine of 2.71, nonfasting blood glucose 131, WBC 8.1, hemoglobin 7.7, platelets of 308.  eGFR 19.  BNP elevated at 3247.   High sensitive troponin has been ordered and is in process.   ED treatment: None   3/20: Vital stable on 4 L of oxygen this morning.  Apparently patient developed shortness of breath with flushing and muscle aches while getting blood transfusion which was discontinued at that time UA and transfusion reaction labs were negative.  Chest x-ray with pulmonary vascular congestion s/p IV Lasix and Benadryl.  CBC this morning with hemoglobin of 8.  Anemia panel with iron deficiency.  IV iron supplement ordered. BMP with slight worsening of creatinine to 2.96.  And potassium of 5.3.  Nephrology was also consulted.   3/21: Vitals and labs seems stable.  Remained on 3 L of oxygen.  Cardiology was also consulted and patient will be going for right heart catheterization tomorrow.   3/22: Vital stable, renal function continued to get worse slowly, persistent hyperkalemia with potassium at 5.4 despite getting Veltassa - dose was increased today. Echocardiogram with worsening of EF and right heart catheterization today with significantly elevated pressures.  Patient  was started on milrinone and Lasix infusion by cardiology.   3/23: Vital stable, remained on 3 L of oxygen.  Hemoglobin improved to 10, slight worsening of renal function with creatinine at 2.91, UOP recorded 2350, patient remains on milrinone and Lasix infusion.   3/24: Hemodynamically stable.  Worsening renal function, UOP recorded of more than 5 L in 24 hours, patient also received metolazone.  CVP or 10.  After discussing with cardiology and nephrology we are holding Lasix infusion today.  A-fib with RVR today heart rate cardiology switched to p.o. amiodarone with IV bolus and infusion.   3/25: Vital stable.  Converted back to sinus rhythm.  Creatinine at 3.19, nephrology decided to continue holding Lasix for another day.  Milrinone dose was decreased today.  Will remain on amiodarone infusion with a plan to convert to p.o. tomorrow.   3/26: Hemodynamically stable.  Renal functions with slow improvement, good UOP.  Nephrology restarted [place].  Remained on amiodarone and milrinone infusions.  Most likely will be converted to p.o. amiodarone today.   3/27: continues to improve. Off milronone. Coreg increased. Cardiology tentatively planning on left heart cath   3/29: stable, awaiting endoluminal eval tomorrow  3/30: EGD was WNL.  Colonoscopy not done due to solid stool.  Plan for colonoscopy tomorrow. 3/31: Colonoscopy with poor prep.  No large lesions seen. 4/1: LHC done today to evaluate unexplained drop in EF showed normal coronaries.     Subjective:  Patient understands that she is to go for her cardiac cath later on today.  Has no questions or concerns.   Objective: Vitals:   11/25/22 1515 11/25/22 1530  11/25/22 1545 11/25/22 1600  BP: (!) 118/51 (!) 113/55  (!) 120/59  Pulse: 70 70 70 73  Resp: 17 15 16 19   Temp:      TempSrc:      SpO2: 97% 97% 98% 98%  Weight:      Height:        Intake/Output Summary (Last 24 hours) at 11/25/2022 1612 Last data filed at 11/25/2022  1500 Gross per 24 hour  Intake 238.05 ml  Output --  Net 238.05 ml    Filed Weights   11/24/22 0437 11/25/22 0417 11/25/22 0551  Weight: 58.2 kg 56.4 kg 58.5 kg     Exam:  General: Well-appearing female sitting up in bed in good spirits. Eyes: sclera anicteric, conjuctiva mild injection bilaterally CVS: S1-S2, regular  Respiratory:  decreased air entry bilaterally secondary to decreased inspiratory effort, rales at bases  GI: NABS, soft, NT  LE: Warm and well-perfused Neuro: A/O x 3,  grossly nonfocal.  Psych: patient is logical and coherent, judgement and insight appear normal, mood and affect appropriate to situation.  Data Reviewed:  Basic Metabolic Panel: Recent Labs  Lab 11/19/22 0452 11/20/22 [id number] 11/21/22 1118 11/22/22 0654 11/23/22 0208 11/23/22 1143 11/24/22 0331 11/25/22 0138 11/25/22 1400  NA 132* 135 134* 134* 132*  --  134* 134* 136  K 4.2 4.7 4.5 4.8 5.3* 5.8* 4.8 5.7* 4.8  CL 98 100 98 100 100  --  104 105  --   CO2 26 27 25 26 23   --  23 22  --   GLUCOSE 160* 109* 207* 97 87  --  88 95  --   BUN 76* 73* 64* 67* 61*  --  46* 51*  --   CREATININE 2.91* 2.82* 2.66* 2.76* 2.53*  --  2.31* 2.69*  --   CALCIUM 8.5* 8.4* 8.7* 8.3* 8.4*  --  8.6* 8.4*  --   MG 2.2 2.1  --   --   --   --   --   --   --      CBC: Recent Labs  Lab 11/22/22 0654 11/23/22 0208 11/24/22 0331 11/25/22 0138 11/25/22 1400  WBC 8.6 6.4 6.6 8.5  --   HGB 9.1* 8.3* 8.4* 8.0* 8.8*  HCT 29.5* 27.4* 27.9* 26.8* 26.0*  MCV 84.3 85.4 85.8 87.6  --   PLT 268 214 226 213  --       Scheduled Meds:  [MAR Hold] amiodarone  200 mg Oral Daily   apixaban  2.5 mg Oral BID   aspirin       [MAR Hold] atorvastatin  40 mg Oral Daily   [MAR Hold] buPROPion  150 mg Oral Daily   [MAR Hold] calcium carbonate  1 tablet Oral TID WC   [MAR Hold] carvedilol  6.25 mg Oral BID WC   [MAR Hold] dapagliflozin propanediol  10 mg Oral Daily   [MAR Hold] fluticasone furoate-vilanterol  1 puff  Inhalation Daily   And   [MAR Hold] umeclidinium bromide  1 puff Inhalation Daily   [MAR Hold] isosorbide-hydrALAZINE  1 tablet Oral TID   [MAR Hold] loratadine  10 mg Oral Daily   [MAR Hold] pantoprazole  40 mg Oral Daily   [MAR Hold] sodium chloride flush  10-40 mL Intracatheter Q12H   [MAR Hold] sodium chloride flush  3 mL Intravenous Q12H   sodium chloride flush  3 mL Intravenous Q12H   [MAR Hold] torsemide  40 mg Oral Daily   [  MAR Hold] venlafaxine XR  150 mg Oral Q breakfast   Continuous Infusions:  [MAR Hold] sodium chloride     sodium chloride Stopped (11/23/22 0913)   sodium chloride       Assessment & Plan:   Disposition Patient with CKD 4 underwent cardiac cath today Only 15 cc of contrast was used If creatinine remains stable and if nephrology okays it, patient can be discharged home tomorrow  CKD 4 Creatinine had improved and gone back to baseline Cardiology would like a GFR greater than 30 prior to [facility] LHC done today with a GFR 19 Nephrology is following  HFrEF LHC done today to evaluate unexplained drop in EF showed normal coronaries Will need to follow kidney function after cath although only 15 cc of contrast was used per cardiology HF was diagnosed in June 2021 with an EF of 30% with new onset A-fib. Patient developed heart failure during transfusion this admission and repeat echo showed  EF of 25 to 30% with global hypokinesis. Patient was seen by Dr. Haroldine Laws today who recommends continuing carvedilol 6.25/Farxiga 10/BiDil 1 tab 3 times daily.  No ARB/ARNI/MRA with CKD 4  Symptomatic anemia IDA/ACD EGD 3/30 was negative Colonoscopy 3/31 was with poor prep but no large lesions seen Previously, patient was unable to complete 1 unit PRBC due to decompensated heart failure requiring Lasix. Further management per outpatient PCP  Atrial fibrillation Patient was started on amiodarone drip and converted back to NSR Has been transitioned over to  p.o. amiodarone Eliquis, renally dosed, was started today after LHC and the heparin drip she had been on was discontinued.  GERD Continue PPI    DVT prophylaxis: Eliquis Code Status: Full     Studies: CARDIAC CATHETERIZATION  Result Date: 11/25/2022 1.  Normal coronary arteries. 2.  Left ventricular angiography was not performed due to chronic kidney disease.  EF was severely reduced by echo. 3.  Right heart catheterization showed normal RA pressure, mildly elevated wedge pressure, moderate pulmonary hypertension and normal cardiac output. RA: 6 mmHg RV: 50/3 mmHg PW: 16 mmHg PA: 50/17 with a mean of 30 mmHg LVEDP was 15 mmHg PA sat was 59% and an AO sat was 89% Cardiac output is 6.08 with an index of 3.7. Recommendations: The patient's heart failure seems to be optimized on current medications. Will resume Eliquis this evening for atrial fibrillation. Suspect that a significant component of her shortness of breath and hypoxia is likely related to COPD.    Principal Problem:   Acute on chronic combined systolic and diastolic CHF (congestive heart failure) Active Problems:   Acute hypoxemic respiratory failure   Symptomatic anemia   Persistent atrial fibrillation   CKD (chronic kidney disease), stage IV   Hyperkalemia   HLD (hyperlipidemia)   Tobacco use   Essential hypertension   Depression, major, single episode, mild   GERD without esophagitis   Dyspnea   Hypoxia   Dilated cardiomyopathy   Mitral valve insufficiency     Demeshia Sherburne Tublu Dannelle Rhymes, [facility]  If 7PM-7AM, please contact night-coverage [email or web address]   LOS: 13 days

## 2022-11-25 NOTE — Consult Note (Signed)
ANTICOAGULATION CONSULT NOTE - Follow Up Consult  Pharmacy Consult for heparin infusion Indication: atrial fibrillation  Allergies  Allergen Reactions   Sulfate Rash   Codeine Sulfate Nausea Only   Benicar [Olmesartan]     Talked with patient February 10, 2020, intolerance is unclear, tried several medications around that time and one of them gave her a rash but she is not clear which 1.   Amoxicillin Rash   Clindamycin/Lincomycin Rash   Entresto [Sacubitril-Valsartan] Other (See Comments)    hyperkalemia   Morphine And Related Rash   Penicillins Rash    Patient Measurements: Height: 5\' 5"  (165.1 cm) Weight: 58.2 kg (128 lb 4.9 oz) IBW/kg (Calculated) : 57 Heparin Dosing Weight: 54.9 kg  Vital Signs: Temp: 97.5 F (36.4 C) (04/01 0411) BP: 135/67 (04/01 0411) Pulse Rate: 73 (04/01 0411)  Labs: Recent Labs    11/23/22 0208 11/23/22 1143 11/24/22 0331 11/24/22 1656 11/25/22 0138 11/25/22 0139  HGB 8.3*  --  8.4*  --  8.0*  --   HCT 27.4*  --  27.9*  --  26.8*  --   PLT 214  --  226  --  213  --   APTT 36   < > 148* 58* 70*  --   HEPARINUNFRC 0.90*  --   --  0.42  --  0.43  CREATININE 2.53*  --  2.31*  --  2.69*  --    < > = values in this interval not displayed.     Estimated Creatinine Clearance: 18.3 mL/min (A) (by C-G formula based on SCr of 2.69 mg/dL (H)).   Medications:  Medications Prior to Admission  Medication Sig Dispense Refill Last Dose   acetaminophen (TYLENOL) 650 MG CR tablet Take 650 mg by mouth every 8 (eight) hours as needed for pain or fever.   Past Week   albuterol (VENTOLIN HFA) 108 (90 Base) MCG/ACT inhaler Inhale 2 puffs into the lungs every 6 (six) hours as needed for wheezing or shortness of breath. 8 g 2 Past Week   amiodarone (PACERONE) 200 MG tablet Take 1 tablet (200 mg total) by mouth daily. 30 tablet 1 11/12/2022   apixaban (ELIQUIS) 2.5 MG TABS tablet Take 1 tablet (2.5 mg total) by mouth 2 (two) times daily. 60 tablet 6 11/12/2022 at  0600   atorvastatin (LIPITOR) 40 MG tablet Take 1 tablet (40 mg total) by mouth daily. 90 tablet 1 11/12/2022   buPROPion (WELLBUTRIN XL) 150 MG 24 hr tablet Take 1 tablet (150 mg total) by mouth daily. 90 tablet 1 11/12/2022   calcium carbonate (OS-CAL - DOSED IN MG OF ELEMENTAL CALCIUM) 1250 (500 Ca) MG tablet Take 1 tablet (500 mg of elemental calcium total) by mouth 3 (three) times daily with meals. 60 tablet 1 11/12/2022   FARXIGA 10 MG TABS tablet Take 10 mg by mouth daily.   11/12/2022   Fluticasone-Umeclidin-Vilant (TRELEGY ELLIPTA) 100-62.5-25 MCG/ACT AEPB Inhale 1 puff into the lungs daily. 3 each 3 11/12/2022   loratadine (CLARITIN) 10 MG tablet Take 10 mg by mouth daily.   11/12/2022   metoprolol succinate (TOPROL XL) 25 MG 24 hr tablet Take 1 tablet (25 mg total) by mouth daily. 30 tablet 1 11/12/2022   omeprazole (PRILOSEC) 20 MG capsule Take 20 mg by mouth daily.   11/12/2022   torsemide (DEMADEX) 20 MG tablet Take 2 tablets (40 mg) by mouth once daily. You may take an extra 2 tablets (40 mg) in the afternoon as needed  for leg swelling 270 tablet 3 11/12/2022   venlafaxine XR (EFFEXOR-XR) 150 MG 24 hr capsule TAKE ONE CAPSULE BY MOUTH EVERY DAY 90 capsule 1 11/12/2022   Scheduled:   amiodarone  200 mg Oral Daily   atorvastatin  40 mg Oral Daily   buPROPion  150 mg Oral Daily   calcium carbonate  1 tablet Oral TID WC   carvedilol  6.25 mg Oral BID WC   dapagliflozin propanediol  10 mg Oral Daily   fluticasone furoate-vilanterol  1 puff Inhalation Daily   And   umeclidinium bromide  1 puff Inhalation Daily   isosorbide-hydrALAZINE  1 tablet Oral TID   loratadine  10 mg Oral Daily   pantoprazole  40 mg Oral Daily   sodium chloride flush  10-40 mL Intracatheter Q12H   sodium chloride flush  3 mL Intravenous Q12H   sodium chloride flush  3 mL Intravenous Q12H   torsemide  40 mg Oral Daily   venlafaxine XR  150 mg Oral Q breakfast   Infusions:   sodium chloride     sodium chloride  Stopped (11/23/22 0913)   sodium chloride     sodium chloride     heparin 800 Units/hr (11/24/22 1823)   PRN: sodium chloride, sodium chloride, albuterol, clonazepam, ondansetron (ZOFRAN) IV, senna-docusate, sodium chloride flush, sodium chloride flush Anti-infectives (From admission, onward)    None       Assessment: 72 YOF admitted for CHF presenting to ED with CC of symptomatic anemia. PMH includes HFpEF, paroxysmal Afib, subarachnoid hemorrhage secondary to mechanical fall, CKD stage 4, HLD, GERD, HTN, COPD. Patient takes apixaban 2.5 mg BID for Afib. Per provider, the plan is to stop apixaban and start heparin infusion. The last dose of apixaban was 3/28 at 0914. CHADSVASc 4.  Pharmacy is being consulted for initiation of heparin infusion for afib while holding apixaban.   Heparin level is still falsely elevated due to prior anticoagulation with apixaban. Using aPTT monitoring until aPTT and heparin levels correlate.   Heparin monitoring: 0329 0654 aPTT 35s, subtherapeutic  0329 1605 aPTT 33s, subtherapeutic increase heparin infusion to 900 units/hr. 0330 0208 aPTT 36s,  HL 0.9  inc to 1100 u/hr 03/30 1143 aPTT 142  SUPRAtherapeutic, hold drip x 1 hour and decrease to 950 u/hr  03/30 2009 aPTT 177, supratherapeutic @ 950 units/hr>>hold x 1 hour and reduce to 850 unit/hr 03/31 0331 aPTT 148  SUPRA therapeutic at 850 u/hr. Drip d/c for coloscopy. 0331 1656 aPTT 58s/HL 0.42, subtherapeutic aPTT @ 750 units/hr 0401 0138 aPTT 70,  HL = 0.43  Goal of Therapy:  Heparin level 0.3-0.5 units/ml when heparin level and aPTT correlate.  aPTT 66-85 seconds Monitor platelets by anticoagulation protocol: Yes   Plan: aPTT is subtherapeutic Per consult: Do not bolus and use lower therapeutic range for monitoring due to possible GI bleed.   4/1 @ 0138:  aPTT = 70 ,  HL = 0.43 - aPTT and HL are therapeutic X 1 and correlating, will use HL to guide dosing  - Will continue this pt on current  rate and draw confirmation HL in 8 hrs on 4/1 @ 1000 -CBC daily while on heparin   Levora Werden D, PharmD Clinical Pharmacist   11/25/2022,4:12 AM

## 2022-11-25 NOTE — Care Management Important Message (Signed)
Important Message  Patient Details  Name: Dawn Sherman MRN: [id number] Date of Birth: [birth year]   Medicare Important Message Given:  Yes  Patient out of room upon time of visit, no family in room.  Copy of Medicare IM left on counter in room for reference.    [name] 11/25/2022, 12:49 PM

## 2022-11-25 NOTE — Consult Note (Signed)
ANTICOAGULATION CONSULT NOTE - Follow Up Consult  Pharmacy Consult for heparin infusion Indication: atrial fibrillation  Allergies  Allergen Reactions   Sulfate Rash   Codeine Sulfate Nausea Only   Benicar [Olmesartan]     Talked with patient February 10, 2020, intolerance is unclear, tried several medications around that time and one of them gave her a rash but she is not clear which 1.   Amoxicillin Rash   Clindamycin/Lincomycin Rash   Entresto [Sacubitril-Valsartan] Other (See Comments)    hyperkalemia   Morphine And Related Rash   Penicillins Rash    Patient Measurements: Height: 5\' 5"  (165.1 cm) Weight: 58.5 kg (128 lb 15.5 oz) IBW/kg (Calculated) : 57 Heparin Dosing Weight: 54.9 kg  Vital Signs: Temp: 98.1 F (36.7 C) (04/01 0900) Temp Source: Oral (04/01 0900) BP: 147/80 (04/01 0900) Pulse Rate: 82 (04/01 0900)  Labs: Recent Labs    11/23/22 0208 11/23/22 1143 11/24/22 0331 11/24/22 1656 11/25/22 0138 11/25/22 0139 11/25/22 1010  HGB 8.3*  --  8.4*  --  8.0*  --   --   HCT 27.4*  --  27.9*  --  26.8*  --   --   PLT 214  --  226  --  213  --   --   APTT 36   < > 148* 58* 70*  --   --   HEPARINUNFRC 0.90*  --   --  0.42  --  0.43 0.45  CREATININE 2.53*  --  2.31*  --  2.69*  --   --    < > = values in this interval not displayed.     Estimated Creatinine Clearance: 18.3 mL/min (A) (by C-G formula based on SCr of 2.69 mg/dL (H)).   Medications:  Medications Prior to Admission  Medication Sig Dispense Refill Last Dose   acetaminophen (TYLENOL) 650 MG CR tablet Take 650 mg by mouth every 8 (eight) hours as needed for pain or fever.   Past Week   albuterol (VENTOLIN HFA) 108 (90 Base) MCG/ACT inhaler Inhale 2 puffs into the lungs every 6 (six) hours as needed for wheezing or shortness of breath. 8 g 2 Past Week   amiodarone (PACERONE) 200 MG tablet Take 1 tablet (200 mg total) by mouth daily. 30 tablet 1 11/12/2022   apixaban (ELIQUIS) 2.5 MG TABS tablet Take  1 tablet (2.5 mg total) by mouth 2 (two) times daily. 60 tablet 6 11/12/2022 at 0600   atorvastatin (LIPITOR) 40 MG tablet Take 1 tablet (40 mg total) by mouth daily. 90 tablet 1 11/12/2022   buPROPion (WELLBUTRIN XL) 150 MG 24 hr tablet Take 1 tablet (150 mg total) by mouth daily. 90 tablet 1 11/12/2022   calcium carbonate (OS-CAL - DOSED IN MG OF ELEMENTAL CALCIUM) 1250 (500 Ca) MG tablet Take 1 tablet (500 mg of elemental calcium total) by mouth 3 (three) times daily with meals. 60 tablet 1 11/12/2022   FARXIGA 10 MG TABS tablet Take 10 mg by mouth daily.   11/12/2022   Fluticasone-Umeclidin-Vilant (TRELEGY ELLIPTA) 100-62.5-25 MCG/ACT AEPB Inhale 1 puff into the lungs daily. 3 each 3 11/12/2022   loratadine (CLARITIN) 10 MG tablet Take 10 mg by mouth daily.   11/12/2022   metoprolol succinate (TOPROL XL) 25 MG 24 hr tablet Take 1 tablet (25 mg total) by mouth daily. 30 tablet 1 11/12/2022   omeprazole (PRILOSEC) 20 MG capsule Take 20 mg by mouth daily.   11/12/2022   torsemide (DEMADEX) 20 MG tablet  Take 2 tablets (40 mg) by mouth once daily. You may take an extra 2 tablets (40 mg) in the afternoon as needed for leg swelling 270 tablet 3 11/12/2022   venlafaxine XR (EFFEXOR-XR) 150 MG 24 hr capsule TAKE ONE CAPSULE BY MOUTH EVERY DAY 90 capsule 1 11/12/2022   Scheduled:   [MAR Hold] amiodarone  200 mg Oral Daily   [MAR Hold] atorvastatin  40 mg Oral Daily   [MAR Hold] buPROPion  150 mg Oral Daily   [MAR Hold] calcium carbonate  1 tablet Oral TID WC   [MAR Hold] carvedilol  6.25 mg Oral BID WC   [MAR Hold] dapagliflozin propanediol  10 mg Oral Daily   [MAR Hold] fluticasone furoate-vilanterol  1 puff Inhalation Daily   And   [MAR Hold] umeclidinium bromide  1 puff Inhalation Daily   [MAR Hold] isosorbide-hydrALAZINE  1 tablet Oral TID   [MAR Hold] loratadine  10 mg Oral Daily   [MAR Hold] pantoprazole  40 mg Oral Daily   [MAR Hold] sodium chloride flush  10-40 mL Intracatheter Q12H   [MAR Hold]  sodium chloride flush  3 mL Intravenous Q12H   sodium chloride flush  3 mL Intravenous Q12H   [MAR Hold] torsemide  40 mg Oral Daily   [MAR Hold] venlafaxine XR  150 mg Oral Q breakfast   Infusions:   [MAR Hold] sodium chloride     sodium chloride Stopped (11/23/22 0913)   sodium chloride     sodium chloride 10 mL/hr at 11/25/22 0700   heparin 800 Units/hr (11/25/22 0700)   PRN: [id number] Hold] sodium chloride, sodium chloride, [MAR Hold] albuterol, [MAR Hold] clonazepam, [MAR Hold] ondansetron (ZOFRAN) IV, [MAR Hold] senna-docusate, [MAR Hold] sodium chloride flush, sodium chloride flush Anti-infectives (From admission, onward)    None       Assessment: 19 YOF admitted for CHF presenting to ED with CC of symptomatic anemia. PMH includes HFpEF, paroxysmal Afib, subarachnoid hemorrhage secondary to mechanical fall, CKD stage 4, HLD, GERD, HTN, COPD. Patient takes apixaban 2.5 mg BID for Afib. Per provider, the plan is to stop apixaban and start heparin infusion. The last dose of apixaban was 3/28 at 0914. CHADSVASc 4.  Pharmacy is being consulted for initiation of heparin infusion for afib while holding apixaban.      Heparin monitoring: 0331 1656 aPTT 58s/HL 0.42, subtherapeutic aPTT @ 750 units/hr 0401 0138 aPTT 70,  HL = 0.43 0401 1010 HL 0.45.   Goal of Therapy:  Heparin level 0.3-0.5 units/ml  Monitor platelets by anticoagulation protocol: Yes   Plan:  Per consult: Do not bolus and use lower therapeutic range for monitoring due to possible GI bleed.   Heparin level is therapeutic. Will continue heparin infusion at 800 units/hr. Recheck heparin level and CBC with AM labs.    Oswald Hillock, PharmD Clinical Pharmacist   11/25/2022,10:53 AM

## 2022-11-26 ENCOUNTER — Other Ambulatory Visit: Payer: Self-pay | Admitting: *Deleted

## 2022-11-26 ENCOUNTER — Telehealth: Payer: Self-pay | Admitting: Internal Medicine

## 2022-11-26 ENCOUNTER — Encounter: Payer: Self-pay | Admitting: Cardiovascular Disease

## 2022-11-26 DIAGNOSIS — I5043 Acute on chronic combined systolic (congestive) and diastolic (congestive) heart failure: Secondary | ICD-10-CM | POA: Diagnosis not present

## 2022-11-26 DIAGNOSIS — D509 Iron deficiency anemia, unspecified: Secondary | ICD-10-CM

## 2022-11-26 LAB — BASIC METABOLIC PANEL
Anion gap: 7 (ref 5–15)
BUN: 51 mg/dL — ABNORMAL HIGH (ref 8–23)
CO2: 22 mmol/L (ref 22–32)
Calcium: 8.6 mg/dL — ABNORMAL LOW (ref 8.9–10.3)
Chloride: 106 mmol/L (ref 98–111)
Creatinine, Ser: 2.81 mg/dL — ABNORMAL HIGH (ref 0.44–1.00)
GFR, Estimated: 18 mL/min — ABNORMAL LOW (ref >60)
Glucose, Bld: 138 mg/dL — ABNORMAL HIGH (ref 70–99)
Potassium: 5.7 mmol/L — ABNORMAL HIGH (ref 3.5–5.1)
Sodium: 135 mmol/L (ref 135–145)

## 2022-11-26 LAB — CBC
HCT: 29.2 % — ABNORMAL LOW (ref 36.0–46.0)
Hemoglobin: 8.7 g/dL — ABNORMAL LOW (ref 12.0–15.0)
MCH: 26.4 pg (ref 26.0–34.0)
MCHC: 29.8 g/dL — ABNORMAL LOW (ref 30.0–36.0)
MCV: 88.5 fL (ref 80.0–100.0)
Platelets: 258 10*3/uL (ref 150–400)
RBC: 3.3 MIL/uL — ABNORMAL LOW (ref 3.87–5.11)
RDW: 23.7 % — ABNORMAL HIGH (ref 11.5–15.5)
WBC: 8.7 10*3/uL (ref 4.0–10.5)
nRBC: 0 % (ref 0.0–0.2)

## 2022-11-26 MED ORDER — PATIROMER SORBITEX CALCIUM 8.4 G PO PACK
25.2000 g | PACK | Freq: Once | ORAL | Status: AC
  Administered 2022-11-26: 25.2 g via ORAL
  Filled 2022-11-26: qty 3

## 2022-11-26 MED ORDER — ISOSORB DINITRATE-HYDRALAZINE 20-37.5 MG PO TABS: 1.0000 | ORAL_TABLET | Freq: Three times a day (TID) | ORAL | 1 refills | Status: DC

## 2022-11-26 MED ORDER — CARVEDILOL 6.25 MG PO TABS: 6.2500 mg | ORAL_TABLET | Freq: Two times a day (BID) | ORAL | 1 refills | Status: DC

## 2022-11-26 NOTE — Progress Notes (Signed)
   Heart Failure Nurse Navigator Note   Patient is being discharged home.  She states she has follow up with the [facility] doctor and has scheduled follow up with PCP.  Discussed how she is going to take care of herself at home, sticking with 64 ounce fluid restriction, 2,000 mg sodium, daily weights and signs and symptoms to report. Calling to the clinic with questions or concerns.  She has no further questions.  Pricilla Riffle RN

## 2022-11-26 NOTE — Plan of Care (Signed)

## 2022-11-26 NOTE — Discharge Summary (Signed)
Physician Discharge Summary   Patient: Dawn Sherman MRN: [id number] DOB: 31-Mar-1956  Admit date:     11/12/2022  Discharge date: 11/26/22  Discharge Physician: Fritzi Mandes   PCP: [name], MD   Recommendations at discharge:    F/u Dr [name] in one week for CKD stage IV and check labs follow-up Dr. Benjamine Mola cardiology in 1 to 2 weeks follow-up PCP in 1 to 2 weeks  Discharge Diagnoses: Principal Problem:   Acute on chronic combined systolic and diastolic CHF (congestive heart failure) Active Problems:   Acute hypoxemic respiratory failure   Symptomatic anemia   Persistent atrial fibrillation   CKD (chronic kidney disease), stage IV   Hyperkalemia   HLD (hyperlipidemia)   Tobacco use   Essential hypertension   Depression, major, single episode, mild   GERD without esophagitis   Dyspnea   Hypoxia   Dilated cardiomyopathy   Mitral valve insufficiency  Dawn Sherman is a 67 year old female with history of heart failure preserved ejection fraction, paroxysmal atrial fibrillation on low-dose Eliquis, history of subarachnoid hemorrhage secondary to mechanical fall, CKD stage IV, COPD, hypertension, who presents to emergency department from cardiology outpatient clinic for chief concerns of symptomatic anemia.  CKD 4 Creatinine had improved and gone back to baseline Cardiology would like a GFR greater than 30 prior to [facility] LHC done today with a GFR 19 Nephrology is following--ok to d/c and f/u with dr [name]. Pt received veltassa today for K 5.7. she also had contrast for LHC. Dr Juleen China aware   HFrEF LHC--showed normal coronaries Will need to follow kidney function after cath although only 15 cc of contrast was used per cardiology HF was diagnosed in June 2021 with an EF of 30% with new onset A-fib. Patient developed heart failure during transfusion this admission and repeat echo showed  EF of 25 to 30% with global hypokinesis. Patient was seen by Dr. Haroldine Laws today  who recommends continuing carvedilol 6.25/Farxiga 10/BiDil 1 tab 3 times daily.  No ARB/ARNI/MRA with CKD 4 --ok to d/c home per [facility] cardiology --on chronic oxygen at home (mainly at night)   Symptomatic anemia IDA/ACD EGD 3/30 was negative Colonoscopy 3/31 was with poor prep but no large lesions seen Previously, patient was unable to complete 1 unit PRBC due to decompensated heart failure requiring Lasix. Further management per outpatient PCP Hgb stable   Atrial fibrillation Patient was started on amiodarone drip and converted back to NSR Has been transitioned over to p.o. amiodarone Eliquis, renally dosed, was started today after LHC and the heparin drip she had been on was discontinued.   GERD Continue PPI    overall at near baseline. Pt agreeable for d/c plans today   DVT prophylaxis: Eliquis Code Status: Full        Consultants: cardiology, nephrology Procedures performed: EGD, colonoscopy, left heart catheterization Disposition: Home Diet recommendation:  Discharge Diet Orders (From admission, onward)     Start     Ordered   11/26/22 0000  Diet - low sodium heart healthy        11/26/22 1206           Cardiac diet DISCHARGE MEDICATION: Allergies as of 11/26/2022       Reactions   Sulfate Rash   Codeine Sulfate Nausea Only   Benicar [olmesartan]    Talked with patient February 10, 2020, intolerance is unclear, tried several medications around that time and one of them gave her a rash but she is not clear which  1.   Amoxicillin Rash   Clindamycin/lincomycin Rash   Entresto [sacubitril-valsartan] Other (See Comments)   hyperkalemia   Morphine And Related Rash   Penicillins Rash        Medication List     STOP taking these medications    metoprolol succinate 25 MG 24 hr tablet Commonly known as: Toprol XL       TAKE these medications    acetaminophen 650 MG CR tablet Commonly known as: TYLENOL Take 650 mg by mouth every 8 (eight) hours as  needed for pain or fever.   albuterol 108 (90 Base) MCG/ACT inhaler Commonly known as: VENTOLIN HFA Inhale 2 puffs into the lungs every 6 (six) hours as needed for wheezing or shortness of breath.   amiodarone 200 MG tablet Commonly known as: PACERONE Take 1 tablet (200 mg total) by mouth daily.   apixaban 2.5 MG Tabs tablet Commonly known as: ELIQUIS Take 1 tablet (2.5 mg total) by mouth 2 (two) times daily.   atorvastatin 40 MG tablet Commonly known as: LIPITOR Take 1 tablet (40 mg total) by mouth daily.   buPROPion 150 MG 24 hr tablet Commonly known as: WELLBUTRIN XL Take 1 tablet (150 mg total) by mouth daily.   calcium carbonate 1250 (500 Ca) MG tablet Commonly known as: OS-CAL - dosed in mg of elemental calcium Take 1 tablet (500 mg of elemental calcium total) by mouth 3 (three) times daily with meals.   carvedilol 6.25 MG tablet Commonly known as: COREG Take 1 tablet (6.25 mg total) by mouth 2 (two) times daily with a meal.   Farxiga 10 MG Tabs tablet Generic drug: dapagliflozin propanediol Take 10 mg by mouth daily.   isosorbide-hydrALAZINE 20-37.5 MG tablet Commonly known as: BIDIL Take 1 tablet by mouth 3 (three) times daily.   loratadine 10 MG tablet Commonly known as: CLARITIN Take 10 mg by mouth daily.   omeprazole 20 MG capsule Commonly known as: PRILOSEC Take 20 mg by mouth daily.   torsemide 20 MG tablet Commonly known as: DEMADEX Take 2 tablets (40 mg) by mouth once daily. You may take an extra 2 tablets (40 mg) in the afternoon as needed for leg swelling   Trelegy Ellipta 100-62.5-25 MCG/ACT Aepb Generic drug: Fluticasone-Umeclidin-Vilant Inhale 1 puff into the lungs daily.   venlafaxine XR 150 MG 24 hr capsule Commonly known as: EFFEXOR-XR TAKE ONE CAPSULE BY MOUTH EVERY DAY        Follow-up Information     [name], MD. Schedule an appointment as soon as possible for a visit in 1 week(s).   Specialty: Internal  Medicine Contact information: [id number] [place] [address] [phone]         Anthonette Legato, MD. Schedule an appointment as soon as possible for a visit in 1 week(s).   Specialty: Nephrology Why: f/u CKD IV and needs BMP checked Contact information: [place] [address] [phone]         Larey Dresser, MD. Go on 12/05/2022.   Specialty: Cardiology Why: hospital f/u Contact information: [address] [place] [address] [phone]                [facility] Weights   11/25/22 0417 11/25/22 0551 11/26/22 0401  Weight: 56.4 kg 58.5 kg 58.8 kg     Condition at discharge: fair  The results of significant diagnostics from this hospitalization (including imaging, microbiology, ancillary and laboratory) are  listed below for reference.   Imaging Studies: CARDIAC CATHETERIZATION  Result Date: 11/25/2022 1.  Normal coronary arteries. 2.  Left ventricular angiography was not performed due to chronic kidney disease.  EF was severely reduced by echo. 3.  Right heart catheterization showed normal RA pressure, mildly elevated wedge pressure, moderate pulmonary hypertension and normal cardiac output. RA: 6 mmHg RV: 50/3 mmHg PW: 16 mmHg PA: 50/17 with a mean of 30 mmHg LVEDP was 15 mmHg PA sat was 59% and an AO sat was 89% Cardiac output is 6.08 with an index of 3.7. Recommendations: The patient's heart failure seems to be optimized on current medications. Will resume Eliquis this evening for atrial fibrillation. Suspect that a significant component of her shortness of breath and hypoxia is likely related to COPD.   DG Chest 2 View  Result Date: 11/20/2022 CLINICAL DATA:  Evaluate pleural effusions. EXAM: CHEST - 2 VIEW COMPARISON:  11/15/2022 FINDINGS: There is a right IJ catheter with tip in the projection of the cavoatrial junction. Stable cardiomediastinal contours. Interval decreased volume of right  pleural effusion with improved aeration to the right lung base. Trace septal markings identified within the lower lung zones compatible with mild edema. No airspace disease. IMPRESSION: 1. Interval decreased volume of right pleural effusion with improved aeration to the right lung base. 2. Mild edema. Electronically Signed   By: Kerby Moors M.D.   On: 11/20/2022 15:03   DG Chest Port 1 View  Result Date: 11/15/2022 CLINICAL DATA:  Central venous catheter in place. EXAM: PORTABLE CHEST 1 VIEW COMPARISON:  Chest radiograph 11/12/2022 and earlier FINDINGS: Of note, the patient is mildly rotated on this portable examination. Interval placement of right IJ central venous catheter with the tip projecting at the level of the superior cavoatrial junction. Stable enlarged cardiac silhouette. New moderate layering right pleural effusion. No left pleural effusion. Mild diffuse hazy interstitial thickening. No pneumothorax. Old bilateral rib fractures. No acute osseous abnormality. Rightward curvature of the distal thoracic spine. IMPRESSION: 1. Interval placement of right IJ central venous catheter with the tip projecting at the level of the superior cavoatrial junction. 2. Moderate layering right pleural effusion, increased compared to most recent exam. No left pleural effusion. 3. Mild pulmonary edema. Electronically Signed   By: Ileana Roup M.D.   On: 11/15/2022 14:14   CARDIAC CATHETERIZATION  Result Date: 11/15/2022 Conclusions: Severely elevated left heart, right heart, and pulmonary artery pressure. Mildly reduced Fick cardiac output/index.  Cardiac output may be overestimated in the setting of significant anemia. Successful placement of a 37F triple-lumen central venous catheter via the right internal jugular vein. Recommendations: Transfer to stepdown. Initiate milrinone 0.25 mcg/kg/min, with further titration based on blood pressure, urine output, and cooximetry. Hold metoprolol in the setting of low  cardiac output requiring inotropic therapy. Resume diuresis. Obtain portable chest radiograph when patient reaches ICU to confirm central line placement. Nelva Bush, MD [facility]  ECHOCARDIOGRAM LIMITED  Result Date: 11/14/2022    ECHOCARDIOGRAM LIMITED REPORT   Patient Name:   Dawn Sherman Date of Exam: 11/14/2022 Medical Rec #:  [id number]     Height:       65.0 in Accession #:    [id number]    Weight:       122.4 lb Date of Birth:  04-04-1956    BSA:          1.605 m Patient Age:    78 years      BP:  137/77 mmHg Patient Gender: F             HR:           74 bpm. Exam Location:  [facility] Procedure: 2D Echo, Cardiac Doppler and Color Doppler Indications:     Cardiomyopathy -unspecified I42.9  History:         Patient has prior history of Echocardiogram examinations, most                  recent 09/28/2022.  Sonographer:     Sherrie Sport Referring Phys:  [place] Phys: Ida Rogue MD  Sonographer Comments: Image quality was good. IMPRESSIONS  1. Left ventricular ejection fraction, by estimation, is 25 to 30%. The left ventricle has severely decreased function. The left ventricle demonstrates global hypokinesis. There is moderate left ventricular hypertrophy. Left ventricular diastolic parameters are indeterminate.  2. Right ventricular systolic function is moderately reduced. The right ventricular size is normal. There is moderately elevated pulmonary artery systolic pressure. The estimated right ventricular systolic pressure is [id number] mmHg.  3. Left atrial size was severely dilated.  4. A small pericardial effusion is present.  5. The mitral valve is normal in structure. Severe mitral valve regurgitation. No evidence of mitral stenosis.  6. Tricuspid valve regurgitation is moderate to severe.  7. The aortic valve is normal in structure. Aortic valve regurgitation is not visualized. No aortic stenosis is present.  8. The inferior vena cava is normal in size with greater than  50% respiratory variability, suggesting right atrial pressure of 3 mmHg. FINDINGS  Left Ventricle: Left ventricular ejection fraction, by estimation, is 25 to 30%. The left ventricle has severely decreased function. The left ventricle demonstrates global hypokinesis. The left ventricular internal cavity size was normal in size. There is moderate left ventricular hypertrophy. Left ventricular diastolic parameters are indeterminate. Right Ventricle: The right ventricular size is normal. No increase in right ventricular wall thickness. Right ventricular systolic function is moderately reduced. There is moderately elevated pulmonary artery systolic pressure. The tricuspid regurgitant velocity is 3.19 m/s, and with an assumed right atrial pressure of 5 mmHg, the estimated right ventricular systolic pressure is [id number] mmHg. Left Atrium: Left atrial size was severely dilated. Right Atrium: Right atrial size was normal in size. Pericardium: A small pericardial effusion is present. Mitral Valve: The mitral valve is normal in structure. There is mild calcification of the mitral valve leaflet(s). Mild mitral annular calcification. Severe mitral valve regurgitation. No evidence of mitral valve stenosis. Tricuspid Valve: The tricuspid valve is normal in structure. Tricuspid valve regurgitation is moderate to severe. No evidence of tricuspid stenosis. Aortic Valve: The aortic valve is normal in structure. Aortic valve regurgitation is not visualized. No aortic stenosis is present. Pulmonic Valve: The pulmonic valve was normal in structure. Pulmonic valve regurgitation is mild. No evidence of pulmonic stenosis. Aorta: The aortic root is normal in size and structure. Venous: The inferior vena cava is normal in size with greater than 50% respiratory variability, suggesting right atrial pressure of 3 mmHg. IAS/Shunts: No atrial level shunt detected by color flow Doppler. Additional Comments: Spectral Doppler performed. Color Doppler  performed.  LEFT VENTRICLE PLAX 2D LVIDd:         5.80 cm LVIDs:         4.70 cm LV PW:         1.20 cm LV IVS:        1.20 cm  LV Volumes (MOD) LV vol d,  MOD A2C: 159.0 ml LV vol d, MOD A4C: 127.0 ml LV vol s, MOD A2C: 119.0 ml LV vol s, MOD A4C: 107.0 ml LV SV MOD A2C:     40.0 ml LV SV MOD A4C:     127.0 ml LV SV MOD BP:      35.0 ml RIGHT VENTRICLE RV Basal diam:  3.00 cm RV Mid diam:    2.30 cm LEFT ATRIUM            Index LA Vol (A2C): 130.0 ml 80.98 ml/m LA Vol (A4C): 57.0 ml  35.51 ml/m  TRICUSPID VALVE TR Peak grad:   40.7 mmHg TR Vmax:        319.00 cm/s Ida Rogue MD Electronically signed by Ida Rogue MD Signature Date/Time: 11/14/2022/5:02:08 PM    Final (Updated)    US RENAL  Result Date: 11/13/2022 CLINICAL DATA:  Renal dysfunction 08/30/2022 EXAM: RENAL / URINARY TRACT ULTRASOUND COMPLETE COMPARISON:  08/30/2022 FINDINGS: Right Kidney: Renal measurements: 6 x 2.4 x 2.6 cm = volume: 18.67 mL. There is no hydronephrosis. Right kidney is small in size. There is marked increase in cortical echogenicity. Left Kidney: Renal measurements: 8 x 9 x 4.7 x 3.6 cm = volume: 78.44 mL. There is increased cortical echogenicity. There is no hydronephrosis. There is a 1 cm cyst in the upper pole. Bladder: Appears normal for degree of bladder distention. Other: Incidental note is made of right pleural effusion. IMPRESSION: There is no hydronephrosis. There is marked atrophy right kidney. There is increased cortical echogenicity in both kidneys suggesting medical renal disease. There is 1 cm cyst in the left kidney.  Right pleural effusion. Electronically Signed   By: Elmer Picker M.D.   On: 11/13/2022 17:52   DG Chest 2 View  Result Date: 11/13/2022 CLINICAL DATA:  Dyspnea on exertion EXAM: CHEST - 2 VIEW COMPARISON:  09/30/2022 FINDINGS: Small right pleural effusion has slightly enlarged with progressive right basilar atelectasis or infiltrate. New small left pleural effusion is present.  Discoid atelectasis within the left mid lung zone. No pneumothorax. Cardiac size is stably enlarged. Mild central pulmonary vascular congestion without overt pulmonary edema. No acute bone abnormality. IMPRESSION: 1. Central pulmonary vascular congestion and enlarging, small bilateral pleural effusions suggesting early changes of cardiogenic failure. 2. Stable cardiomegaly. Electronically Signed   By: Fidela Salisbury M.D.   On: 11/13/2022 00:14   DG Chest Port 1 View  Result Date: 11/13/2022 CLINICAL DATA:  Dyspnea on exertion EXAM: PORTABLE CHEST 1 VIEW COMPARISON:  11/12/2022 FINDINGS: Stable cardiomegaly. Stable central pulmonary vascular congestion with superimposed perihilar interstitial pulmonary infiltrate most in keeping with mild cardiogenic pulmonary edema. Small bilateral pleural effusions again identified, unchanged. No pneumothorax. Thoracic dextroscoliosis again noted. No acute bone abnormality. IMPRESSION: 1. Stable mild cardiogenic failure with small bilateral pleural effusions. Electronically Signed   By: Fidela Salisbury M.D.   On: 11/13/2022 00:10   DG Chest Port 1 View  Result Date: 11/12/2022 CLINICAL DATA:  Shortness of breath EXAM: PORTABLE CHEST 1 VIEW COMPARISON:  Previous studies including the examination done earlier today FINDINGS: Transverse diameter of heart is increased. Increased markings are seen in right parahilar region and right lower lung field suggesting asymmetric interstitial edema or interstitial pneumonia. Right lung is smaller than left. There are small transverse linear densities in left lower lung field. There is blunting of both lateral CP angles. There is no pneumothorax. IMPRESSION: Cardiomegaly. Increased markings in right parahilar region and right lower lung fields suggest  possible pneumonia. Linear densities in left parahilar region suggest subsegmental atelectasis. Small bilateral pleural effusions. Electronically Signed   By: Elmer Picker M.D.   On:  11/12/2022 16:11    Microbiology: Results for orders placed or performed during the hospital encounter of 11/12/22  MRSA Next Gen by PCR, Nasal     Status: None   Collection Time: 11/15/22  2:17 PM   Specimen: Nasal Mucosa; Nasal Swab  Result Value Ref Range Status   MRSA by PCR Next Gen NOT DETECTED NOT DETECTED Final    Comment: (NOTE) The GeneXpert MRSA Assay (FDA approved for NASAL specimens only), is one component of a comprehensive MRSA colonization surveillance program. It is not intended to diagnose MRSA infection nor to guide or monitor treatment for MRSA infections. Test performance is not FDA approved in patients less than 26 years old. Performed at [facility], [address] [place], [place] [address]     Labs: CBC: Recent Labs  Lab 11/22/22 0654 11/23/22 0208 11/24/22 0331 11/25/22 0138 11/25/22 1400 11/26/22 0522  WBC 8.6 6.4 6.6 8.5  --  8.7  HGB 9.1* 8.3* 8.4* 8.0* 8.8*  8.8* 8.7*  HCT 29.5* 27.4* 27.9* 26.8* 26.0*  26.0* 29.2*  MCV 84.3 85.4 85.8 87.6  --  88.5  PLT 268 214 226 213  --  0000000   Basic Metabolic Panel: Recent Labs  Lab 11/20/22 0613 11/21/22 1118 11/22/22 0654 11/23/22 0208 11/23/22 1143 11/24/22 0331 11/25/22 0138 11/25/22 1400 11/26/22 0522  NA 135   < > 134* 132*  --  134* 134* 136  136 135  K 4.7   < > 4.8 5.3* 5.8* 4.8 5.7* 4.9  4.8 5.7*  CL 100   < > 100 100  --  104 105  --  106  CO2 27   < > 26 23  --  23 22  --  22  GLUCOSE 109*   < > 97 87  --  88 95  --  138*  BUN 73*   < > 67* 61*  --  46* 51*  --  51*  CREATININE 2.82*   < > 2.76* 2.53*  --  2.31* 2.69*  --  2.81*  CALCIUM 8.4*   < > 8.3* 8.4*  --  8.6* 8.4*  --  8.6*  MG 2.1  --   --   --   --   --   --   --   --    < > = values in this interval not displayed.    Discharge time spent: greater than 30 minutes.  Signed: Fritzi Mandes, MD [facility] 11/26/2022

## 2022-11-26 NOTE — Progress Notes (Deleted)
[facility]  ROUNDING NOTE   Subjective:   [name] was admitted to [facility] on 11/12/2022 for Hypoxia [R09.02] Symptomatic anemia [D64.9] Anemia, unspecified type [D64.9] Dyspnea, unspecified type [R06.00]  Patient was last seen by nephrology on 11/20/2021 for her chronic kidney disease by Dr. Holley Sherman.   Left heart catheterization yesterday.   K 5.7   Objective:  Vital signs in last 24 hours:  Temp:  [97.7 F (36.5 C)-98 F (36.7 C)] 98 F (36.7 C) (04/02 0732) Pulse Rate:  [65-76] 72 (04/02 0732) Resp:  [14-19] 14 (04/02 0732) BP: (113-141)/(55-79) 141/79 (04/02 0732) SpO2:  [95 %-100 %] 96 % (04/02 1120) Weight:  [58.8 kg] 58.8 kg (04/02 0401)  Weight change: 2.386 kg Filed Weights   11/25/22 0417 11/25/22 0551 11/26/22 0401  Weight: 56.4 kg 58.5 kg 58.8 kg    Intake/Output: I/O last 3 completed shifts: In: 1353.5 [P.O.:1160; I.V.:193.5] Out: -    Intake/Output this shift:  Total I/O In: 250 [P.O.:240; I.V.:10] Out: -   Physical Exam: General: NAD, sitting at side of bed  Head: Normocephalic, atraumatic. Moist oral mucosal membranes  Eyes: Anicteric  Lungs:  Clear to auscultation, normal effort  Heart: Regular  Abdomen:  Soft, nontender, nondistended  Extremities: No peripheral edema.  Neurologic: Nonfocal, moving all four extremities  Skin: No lesions    Basic Metabolic Panel: Recent Labs  Lab 11/20/22 0613 11/21/22 1118 11/22/22 0654 11/23/22 0208 11/23/22 1143 11/24/22 0331 11/25/22 0138 11/25/22 1400 11/26/22 0522  NA 135   < > 134* 132*  --  134* 134* 136  136 135  K 4.7   < > 4.8 5.3* 5.8* 4.8 5.7* 4.9  4.8 5.7*  CL 100   < > 100 100  --  104 105  --  106  CO2 27   < > 26 23  --  23 22  --  22  GLUCOSE 109*   < > 97 87  --  88 95  --  138*  BUN 73*   < > 67* 61*  --  46* 51*  --  51*  CREATININE 2.82*   < > 2.76* 2.53*  --  2.31* 2.69*  --  2.81*  CALCIUM 8.4*   < > 8.3* 8.4*  --  8.6* 8.4*  --  8.6*  MG 2.1  --    --   --   --   --   --   --   --    < > = values in this interval not displayed.     Liver Function Tests: No results for input(s): "AST", "ALT", "ALKPHOS", "BILITOT", "PROT", "ALBUMIN" in the last 168 hours.  No results for input(s): "LIPASE", "AMYLASE" in the last 168 hours. No results for input(s): "AMMONIA" in the last 168 hours.  CBC: Recent Labs  Lab 11/22/22 0654 11/23/22 0208 11/24/22 0331 11/25/22 0138 11/25/22 1400 11/26/22 0522  WBC 8.6 6.4 6.6 8.5  --  8.7  HGB 9.1* 8.3* 8.4* 8.0* 8.8*  8.8* 8.7*  HCT 29.5* 27.4* 27.9* 26.8* 26.0*  26.0* 29.2*  MCV 84.3 85.4 85.8 87.6  --  88.5  PLT 268 214 226 213  --  258     Cardiac Enzymes: No results for input(s): "CKTOTAL", "CKMB", "CKMBINDEX", "TROPONINI" in the last 168 hours.  BNP: Invalid input(s): "POCBNP"  CBG: No results for input(s): "GLUCAP" in the last 168 hours.   Microbiology: Results for orders placed or performed during the hospital  encounter of 11/12/22  MRSA Next Gen by PCR, Nasal     Status: None   Collection Time: 11/15/22  2:17 PM   Specimen: Nasal Mucosa; Nasal Swab  Result Value Ref Range Status   MRSA by PCR Next Gen NOT DETECTED NOT DETECTED Final    Comment: (NOTE) The GeneXpert MRSA Assay (FDA approved for NASAL specimens only), is one component of a comprehensive MRSA colonization surveillance program. It is not intended to diagnose MRSA infection nor to guide or monitor treatment for MRSA infections. Test performance is not FDA approved in patients less than 39 years old. Performed at [facility], [address] [place], [place] [address]     Coagulation Studies: No results for input(s): "LABPROT", "INR" in the last 72 hours.   Urinalysis: No results for input(s): "COLORURINE", "LABSPEC", "PHURINE", "GLUCOSEU", "HGBUR", "BILIRUBINUR", "KETONESUR", "PROTEINUR", "UROBILINOGEN", "NITRITE", "LEUKOCYTESUR" in the last 72 hours.  Invalid input(s): "APPERANCEUR"      Imaging: CARDIAC CATHETERIZATION  Result Date: 11/25/2022 1.  Normal coronary arteries. 2.  Left ventricular angiography was not performed due to chronic kidney disease.  EF was severely reduced by echo. 3.  Right heart catheterization showed normal RA pressure, mildly elevated wedge pressure, moderate pulmonary hypertension and normal cardiac output. RA: 6 mmHg RV: 50/3 mmHg PW: 16 mmHg PA: 50/17 with a mean of 30 mmHg LVEDP was 15 mmHg PA sat was 59% and an AO sat was 89% Cardiac output is 6.08 with an index of 3.7. Recommendations: The patient's heart failure seems to be optimized on current medications. Will resume Eliquis this evening for atrial fibrillation. Suspect that a significant component of her shortness of breath and hypoxia is likely related to COPD.     Medications:    sodium chloride     sodium chloride Stopped (11/23/22 0913)   sodium chloride      amiodarone  200 mg Oral Daily   apixaban  2.5 mg Oral BID   atorvastatin  40 mg Oral Daily   buPROPion  150 mg Oral Daily   calcium carbonate  1 tablet Oral TID WC   carvedilol  6.25 mg Oral BID WC   dapagliflozin propanediol  10 mg Oral Daily   fluticasone furoate-vilanterol  1 puff Inhalation Daily   And   umeclidinium bromide  1 puff Inhalation Daily   isosorbide-hydrALAZINE  1 tablet Oral TID   loratadine  10 mg Oral Daily   pantoprazole  40 mg Oral Daily   sodium chloride flush  10-40 mL Intracatheter Q12H   sodium chloride flush  3 mL Intravenous Q12H   sodium chloride flush  3 mL Intravenous Q12H   torsemide  40 mg Oral Daily   venlafaxine XR  150 mg Oral Q breakfast   sodium chloride, sodium chloride, acetaminophen, albuterol, clonazepam, ondansetron (ZOFRAN) IV, senna-docusate, sodium chloride flush, sodium chloride flush  Assessment/ Plan:  [name] is a 67 y.o.  female with hypertension, atrial fibrillation, chronic congestive heart failure, atrophic right kidney, endometriosis, hyperlipidemia,  tobacco abuse, GERD, and depression who is admitted to [facility] on 11/12/2022 for Hypoxia [R09.02] Symptomatic anemia [D64.9] Anemia, unspecified type [D64.9] Dyspnea, unspecified type [R06.00]  Acute kidney injury on Chronic kidney disease stage IV: with hyperkalemia and proteinuria. Baseline creatinine appears to be 2.49 in January. Renal function back to baseline.  - allergy to ARB - Continue dapagliflozin - Continue torsemide.  - Veltass ordered.   Hypertension and acute exacerbation of systolic congestive heart failure: echocardiogram  with EF of 25-30%.  -Continue torsemide 40 mg daily, carvedilol, and Bidil.   Anemia with chronic kidney disease and iron deficiency:  IV iron given on 11/13/22  - appreciate GI input.  EGD and colonoscopy completed on 11/24/2022.  Colonoscopy with poor visibility, but negative for any large masses.  EGD also negative.    Secondary Hyperparathyroidism: with hypocalcemia. PTH 92 from 11/13/2021 - continue calcium carbonate.    LOS: [place] 4/2/20243:20 PM

## 2022-11-26 NOTE — Progress Notes (Signed)
[facility]  ROUNDING NOTE   Subjective:   [name] was admitted to [facility] on 11/12/2022 for Hypoxia [R09.02] Symptomatic anemia [D64.9] Anemia, unspecified type [D64.9] Dyspnea, unspecified type [R06.00]  Patient was last seen by nephrology on 11/20/2021 for her chronic kidney disease by Dr. [name].   Patient seen sitting up in bed States she feels well Ready for discharge Tolerating meals without nausea or vomiting   Objective:  Vital signs in last 24 hours:  Temp:  [97.7 F (36.5 C)-98 F (36.7 C)] 98 F (36.7 C) (04/02 0732) Pulse Rate:  [65-76] 72 (04/02 0732) Resp:  [14-19] 14 (04/02 0732) BP: (113-141)/(55-79) 141/79 (04/02 0732) SpO2:  [95 %-100 %] 96 % (04/02 1120) Weight:  [58.8 kg] 58.8 kg (04/02 0401)  Weight change: 2.386 kg Filed Weights   11/25/22 0417 11/25/22 0551 11/26/22 0401  Weight: 56.4 kg 58.5 kg 58.8 kg    Intake/Output: I/O last 3 completed shifts: In: 1353.5 [P.O.:1160; I.V.:193.5] Out: -    Intake/Output this shift:  Total I/O In: 250 [P.O.:240; I.V.:10] Out: -   Physical Exam: General: NAD, sitting up in bed  Head: Normocephalic, atraumatic. Moist oral mucosal membranes  Eyes: Anicteric  Lungs:  Clear to auscultation, normal effort  Heart: Regular  Abdomen:  Soft, nontender, nondistended  Extremities: No peripheral edema.  Neurologic: Nonfocal, moving all four extremities  Skin: No lesions    Basic Metabolic Panel: Recent Labs  Lab 11/20/22 0613 11/21/22 1118 11/22/22 0654 11/23/22 0208 11/23/22 1143 11/24/22 0331 11/25/22 0138 11/25/22 1400 11/26/22 0522  NA 135   < > 134* 132*  --  134* 134* 136  136 135  K 4.7   < > 4.8 5.3* 5.8* 4.8 5.7* 4.9  4.8 5.7*  CL 100   < > 100 100  --  104 105  --  106  CO2 27   < > 26 23  --  23 22  --  22  GLUCOSE 109*   < > 97 87  --  88 95  --  138*  BUN 73*   < > 67* 61*  --  46* 51*  --  51*  CREATININE 2.82*   < > 2.76* 2.53*  --  2.31* 2.69*  --  2.81*   CALCIUM 8.4*   < > 8.3* 8.4*  --  8.6* 8.4*  --  8.6*  MG 2.1  --   --   --   --   --   --   --   --    < > = values in this interval not displayed.     Liver Function Tests: No results for input(s): "AST", "ALT", "ALKPHOS", "BILITOT", "PROT", "ALBUMIN" in the last 168 hours.  No results for input(s): "LIPASE", "AMYLASE" in the last 168 hours. No results for input(s): "AMMONIA" in the last 168 hours.  CBC: Recent Labs  Lab 11/22/22 0654 11/23/22 0208 11/24/22 0331 11/25/22 0138 11/25/22 1400 11/26/22 0522  WBC 8.6 6.4 6.6 8.5  --  8.7  HGB 9.1* 8.3* 8.4* 8.0* 8.8*  8.8* 8.7*  HCT 29.5* 27.4* 27.9* 26.8* 26.0*  26.0* 29.2*  MCV 84.3 85.4 85.8 87.6  --  88.5  PLT 268 214 226 213  --  258     Cardiac Enzymes: No results for input(s): "CKTOTAL", "CKMB", "CKMBINDEX", "TROPONINI" in the last 168 hours.  BNP: Invalid input(s): "POCBNP"  CBG: No results for input(s): "GLUCAP" in the last 168 hours.  Microbiology: Results for orders placed or performed during the hospital encounter of 11/12/22  MRSA Next Gen by PCR, Nasal     Status: None   Collection Time: 11/15/22  2:17 PM   Specimen: Nasal Mucosa; Nasal Swab  Result Value Ref Range Status   MRSA by PCR Next Gen NOT DETECTED NOT DETECTED Final    Comment: (NOTE) The GeneXpert MRSA Assay (FDA approved for NASAL specimens only), is one component of a comprehensive MRSA colonization surveillance program. It is not intended to diagnose MRSA infection nor to guide or monitor treatment for MRSA infections. Test performance is not FDA approved in patients less than 73 years old. Performed at [facility], [address] [place], [place] [address]     Coagulation Studies: No results for input(s): "LABPROT", "INR" in the last 72 hours.   Urinalysis: No results for input(s): "COLORURINE", "LABSPEC", "PHURINE", "GLUCOSEU", "HGBUR", "BILIRUBINUR", "KETONESUR", "PROTEINUR", "UROBILINOGEN", "NITRITE",  "LEUKOCYTESUR" in the last 72 hours.  Invalid input(s): "APPERANCEUR"     Imaging: CARDIAC CATHETERIZATION  Result Date: 11/25/2022 1.  Normal coronary arteries. 2.  Left ventricular angiography was not performed due to chronic kidney disease.  EF was severely reduced by echo. 3.  Right heart catheterization showed normal RA pressure, mildly elevated wedge pressure, moderate pulmonary hypertension and normal cardiac output. RA: 6 mmHg RV: 50/3 mmHg PW: 16 mmHg PA: 50/17 with a mean of 30 mmHg LVEDP was 15 mmHg PA sat was 59% and an AO sat was 89% Cardiac output is 6.08 with an index of 3.7. Recommendations: The patient's heart failure seems to be optimized on current medications. Will resume Eliquis this evening for atrial fibrillation. Suspect that a significant component of her shortness of breath and hypoxia is likely related to COPD.     Medications:    sodium chloride     sodium chloride Stopped (11/23/22 0913)   sodium chloride      amiodarone  200 mg Oral Daily   apixaban  2.5 mg Oral BID   atorvastatin  40 mg Oral Daily   buPROPion  150 mg Oral Daily   calcium carbonate  1 tablet Oral TID WC   carvedilol  6.25 mg Oral BID WC   dapagliflozin propanediol  10 mg Oral Daily   fluticasone furoate-vilanterol  1 puff Inhalation Daily   And   umeclidinium bromide  1 puff Inhalation Daily   isosorbide-hydrALAZINE  1 tablet Oral TID   loratadine  10 mg Oral Daily   pantoprazole  40 mg Oral Daily   sodium chloride flush  10-40 mL Intracatheter Q12H   sodium chloride flush  3 mL Intravenous Q12H   sodium chloride flush  3 mL Intravenous Q12H   torsemide  40 mg Oral Daily   venlafaxine XR  150 mg Oral Q breakfast   sodium chloride, sodium chloride, acetaminophen, albuterol, clonazepam, ondansetron (ZOFRAN) IV, senna-docusate, sodium chloride flush, sodium chloride flush  Assessment/ Plan:  [name] is a 67 y.o.  female with hypertension, atrial fibrillation, chronic  congestive heart failure, atrophic right kidney, endometriosis, hyperlipidemia, tobacco abuse, GERD, and depression who is admitted to [facility] on 11/12/2022 for Hypoxia [R09.02] Symptomatic anemia [D64.9] Anemia, unspecified type [D64.9] Dyspnea, unspecified type [R06.00]  Acute kidney injury on Chronic kidney disease stage IV: with hyperkalemia and proteinuria. Baseline creatinine appears to be 2.49 in January. Renal function back to baseline.  - allergy to ARB - Continue dapagliflozin - Continue torsemide.  -Renal function remains stable. -  Will schedule patient follow-up in our office at discharge.  Hypertension and acute exacerbation of systolic congestive heart failure: echocardiogram with EF of 25-30%.  - Appreciate cardiology input.  Tentatively scheduled for left heart catheterization later this week.  -Continue torsemide 40 mg daily, carvedilol, and Bidil.  -Left heart cath unremarkable.  Cardiology will continue to follow outpatient.  Anemia with chronic kidney disease and iron deficiency:  IV iron given on 11/13/22  - appreciate GI input.  EGD and colonoscopy completed on 11/24/2022.  Colonoscopy with poor visibility, but negative for any large masses.  EGD also negative.    Secondary Hyperparathyroidism: with hypocalcemia. PTH 92 from 11/13/2021 - continue calcium carbonate.   5.  Hyperkalemia.  Patient prescribed Veltassa.   LOS: [place] 4/2/20243:18 PM

## 2022-11-26 NOTE — Progress Notes (Signed)
Advanced Heart Failure Rounding Note   Subjective:    Cath yesterday with normal filling pressures and normal coronaries.   Denies CP or SOB. Lying flat.   Scr up slightly today. K 5.7 (Renal managing)  RA: 6 mmHg RV: 50/3 mmHg PW: 16 mmHg PA: 50/17 with a mean of 30 mmHg LVEDP was 15 mmHg PA sat was 59% and an AO sat was 89% Cardiac output is 6.08 with an index of 3.7.   Objective:   Weight Range:  Vital Signs:   Temp:  [97.7 F (36.5 C)-98 F (36.7 C)] 98 F (36.7 C) (04/02 0732) Pulse Rate:  [65-76] 72 (04/02 0732) Resp:  [14-19] 14 (04/02 0732) BP: (113-141)/(51-79) 141/79 (04/02 0732) SpO2:  [93 %-100 %] 100 % (04/02 0732) Weight:  [58.8 kg] 58.8 kg (04/02 0401) Last BM Date : 11/24/22  Weight change: Filed Weights   11/25/22 0417 11/25/22 0551 11/26/22 0401  Weight: 56.4 kg 58.5 kg 58.8 kg    Intake/Output:   Intake/Output Summary (Last 24 hours) at 11/26/2022 1116 Last data filed at 11/26/2022 1023 Gross per 24 hour  Intake 1479.6 ml  Output --  Net 1479.6 ml     Physical Exam: General:  Lying flat in bed No resp difficulty HEENT: normal Neck: supple. JVP 6 . Carotids 2+ bilat; no bruits. No lymphadenopathy or thryomegaly appreciated. Cor: PMI nondisplaced. Regular rate & rhythm. No rubs, gallops or murmurs. Lungs: decreased throughout Abdomen: soft, nontender, nondistended. No hepatosplenomegaly. No bruits or masses. Good bowel sounds. Extremities: no cyanosis, clubbing, rash, edema Neuro: alert & orientedx3, cranial nerves grossly intact. moves all 4 extremities w/o difficulty. Affect pleasant  Telemetry: Sinus 70s Personally reviewed   Labs: Basic Metabolic Panel: Recent Labs  Lab 11/20/22 0613 11/21/22 1118 11/22/22 0654 11/23/22 0208 11/23/22 1143 11/24/22 0331 11/25/22 0138 11/25/22 1400 11/26/22 0522  NA 135   < > 134* 132*  --  134* 134* 136  136 135  K 4.7   < > 4.8 5.3* 5.8* 4.8 5.7* 4.9  4.8 5.7*  CL 100   < > 100  100  --  104 105  --  106  CO2 27   < > 26 23  --  23 22  --  22  GLUCOSE 109*   < > 97 87  --  88 95  --  138*  BUN 73*   < > 67* 61*  --  46* 51*  --  51*  CREATININE 2.82*   < > 2.76* 2.53*  --  2.31* 2.69*  --  2.81*  CALCIUM 8.4*   < > 8.3* 8.4*  --  8.6* 8.4*  --  8.6*  MG 2.1  --   --   --   --   --   --   --   --    < > = values in this interval not displayed.    Liver Function Tests: No results for input(s): "AST", "ALT", "ALKPHOS", "BILITOT", "PROT", "ALBUMIN" in the last 168 hours. No results for input(s): "LIPASE", "AMYLASE" in the last 168 hours. No results for input(s): "AMMONIA" in the last 168 hours.  CBC: Recent Labs  Lab 11/22/22 0654 11/23/22 0208 11/24/22 0331 11/25/22 0138 11/25/22 1400 11/26/22 0522  WBC 8.6 6.4 6.6 8.5  --  8.7  HGB 9.1* 8.3* 8.4* 8.0* 8.8*  8.8* 8.7*  HCT 29.5* 27.4* 27.9* 26.8* 26.0*  26.0* 29.2*  MCV 84.3 85.4 85.8 87.6  --  88.5  PLT 268 214 226 213  --  258    Cardiac Enzymes: No results for input(s): "CKTOTAL", "CKMB", "CKMBINDEX", "TROPONINI" in the last 168 hours.  BNP: BNP (last 3 results) Recent Labs    09/27/22 1026 11/12/22 1125 11/12/22 1357  BNP >4,500.0* 3,619.9* 3,247.0*    ProBNP (last 3 results) No results for input(s): "PROBNP" in the last 8760 hours.    Other results:  Imaging: CARDIAC CATHETERIZATION  Result Date: 11/25/2022 1.  Normal coronary arteries. 2.  Left ventricular angiography was not performed due to chronic kidney disease.  EF was severely reduced by echo. 3.  Right heart catheterization showed normal RA pressure, mildly elevated wedge pressure, moderate pulmonary hypertension and normal cardiac output. RA: 6 mmHg RV: 50/3 mmHg PW: 16 mmHg PA: 50/17 with a mean of 30 mmHg LVEDP was 15 mmHg PA sat was 59% and an AO sat was 89% Cardiac output is 6.08 with an index of 3.7. Recommendations: The patient's heart failure seems to be optimized on current medications. Will resume Eliquis this  evening for atrial fibrillation. Suspect that a significant component of her shortness of breath and hypoxia is likely related to COPD.     Medications:     Scheduled Medications:  amiodarone  200 mg Oral Daily   apixaban  2.5 mg Oral BID   atorvastatin  40 mg Oral Daily   buPROPion  150 mg Oral Daily   calcium carbonate  1 tablet Oral TID WC   carvedilol  6.25 mg Oral BID WC   dapagliflozin propanediol  10 mg Oral Daily   fluticasone furoate-vilanterol  1 puff Inhalation Daily   And   umeclidinium bromide  1 puff Inhalation Daily   isosorbide-hydrALAZINE  1 tablet Oral TID   loratadine  10 mg Oral Daily   pantoprazole  40 mg Oral Daily   sodium chloride flush  10-40 mL Intracatheter Q12H   sodium chloride flush  3 mL Intravenous Q12H   sodium chloride flush  3 mL Intravenous Q12H   torsemide  40 mg Oral Daily   venlafaxine XR  150 mg Oral Q breakfast    Infusions:  sodium chloride     sodium chloride Stopped (11/23/22 0913)   sodium chloride      PRN Medications: sodium chloride, sodium chloride, acetaminophen, albuterol, clonazepam, ondansetron (ZOFRAN) IV, senna-docusate, sodium chloride flush, sodium chloride flush   Assessment/Plan:   1. Acute on chronic diastolic and systolic CHF - HF diagnosed in 6/21. Echo EF 30-35% in setting of new-onset AF. - Echo 12/21  EF 50-55%  G1DD - Echo 11/23  EF 50-55%.  Unfortunately, she was readmitted in January 2024 with  - Echo 09/28/22, EF 35-40% performed during an episode of atrial flutter, showed an EF of 35 to 40%.  Following diuresis, she was discharged home.   - Myoview 2/24 EF of 43% with apical infarct and mild peri-infarct ischemia.  No cath at that time due to CKD 4.  - Echo 11/14/22: EF 25-30% with global Hk, RV moderately down. Severe MR, mod-severe TR - NYHA III-IIIB - R/L cath 11/25/22 with normal coronaries and normal filling pressures/output - Continue carvedilol 6.25 bid - Continue Farxiga 10 - Continue bidil 1  tab tid - Continue torsemide 40 daily - No ARB/ARNI/MRA with CKD 4 - Overall stable from cardiac standpoint. Will need close f/u of fluid status/renal function.  - Etiology of NICM remains unclear. Will need cMRI as outpatient    2. Paroxysmal  atrial fibrillation/flutter - Currently normal sinus rhythm on low-dose amiodarone, beta-blocker - Restart Eliquis   3. Acute on chronic anemia - In the setting of renal failure - Management per primary team   4. Chronic kidney disease stage IV - Baseline SCr 2.5-2.8 - Continue Farxiga - Marked right kidney atrophy on u/s. Biateral cortical thinning - Consider underlying RAS - possible vascular u/s - Renal following   5. COPD with ongoing tobacco use - discussed cessation    6. R>L pleural effusions - suspect contributing to dyspnea - will need f/u as outpatient for possible tap     Ok for d/c today from HF perspective. Will need close f/u in HF Clinic as well as Nephrology and Pulmonary.  Will arrange HF f/u for next week.    Length of Stay: 14   Glori Bickers MD 11/26/2022, 11:16 AM  Advanced Heart Failure Team Pager [phone] (M-F; 7a - 4p)  Please contact [facility] for night-coverage after hours (4p -7a ) and weekends on [email or web address]

## 2022-11-26 NOTE — Plan of Care (Signed)
Problem: Education: Goal: Knowledge of General Education information will improve Description: Including pain rating scale, medication(s)/side effects and non-pharmacologic comfort measures 11/26/2022 1312 by Shauna Hugh, RN Outcome: Adequate for Discharge 11/26/2022 0821 by Shauna Hugh, RN Outcome: Progressing   Problem: Health Behavior/Discharge Planning: Goal: Ability to manage health-related needs will improve 11/26/2022 1312 by Shauna Hugh, RN Outcome: Adequate for Discharge 11/26/2022 0821 by Shauna Hugh, RN Outcome: Progressing   Problem: Clinical Measurements: Goal: Ability to maintain clinical measurements within normal limits will improve 11/26/2022 1312 by Shauna Hugh, RN Outcome: Adequate for Discharge 11/26/2022 0821 by Shauna Hugh, RN Outcome: Progressing Goal: Will remain free from infection 11/26/2022 1312 by Shauna Hugh, RN Outcome: Adequate for Discharge 11/26/2022 0821 by Shauna Hugh, RN Outcome: Progressing Goal: Diagnostic test results will improve 11/26/2022 1312 by Shauna Hugh, RN Outcome: Adequate for Discharge 11/26/2022 0821 by Shauna Hugh, RN Outcome: Progressing Goal: Respiratory complications will improve 11/26/2022 1312 by Shauna Hugh, RN Outcome: Adequate for Discharge 11/26/2022 [phone] by Shauna Hugh, RN Outcome: Progressing Goal: Cardiovascular complication will be avoided 11/26/2022 1312 by Shauna Hugh, RN Outcome: Adequate for Discharge 11/26/2022 0821 by Shauna Hugh, RN Outcome: Progressing   Problem: Activity: Goal: Risk for activity intolerance will decrease 11/26/2022 1312 by Shauna Hugh, RN Outcome: Adequate for Discharge 11/26/2022 [phone] by Shauna Hugh, RN Outcome: Progressing   Problem: Nutrition: Goal: Adequate nutrition will be maintained 11/26/2022 1312 by Shauna Hugh, RN Outcome: Adequate for Discharge 11/26/2022 0821 by Shauna Hugh, RN Outcome: Progressing   Problem: Coping: Goal: Level of anxiety will  decrease 11/26/2022 1312 by Shauna Hugh, RN Outcome: Adequate for Discharge 11/26/2022 0821 by Shauna Hugh, RN Outcome: Progressing   Problem: Elimination: Goal: Will not experience complications related to bowel motility 11/26/2022 1312 by Shauna Hugh, RN Outcome: Adequate for Discharge 11/26/2022 0821 by Shauna Hugh, RN Outcome: Progressing Goal: Will not experience complications related to urinary retention 11/26/2022 1312 by Shauna Hugh, RN Outcome: Adequate for Discharge 11/26/2022 0821 by Shauna Hugh, RN Outcome: Progressing   Problem: Pain Managment: Goal: General experience of comfort will improve 11/26/2022 1312 by Shauna Hugh, RN Outcome: Adequate for Discharge 11/26/2022 0821 by Shauna Hugh, RN Outcome: Progressing   Problem: Safety: Goal: Ability to remain free from injury will improve 11/26/2022 1312 by Shauna Hugh, RN Outcome: Adequate for Discharge 11/26/2022 0821 by Shauna Hugh, RN Outcome: Progressing   Problem: Skin Integrity: Goal: Risk for impaired skin integrity will decrease 11/26/2022 1312 by Shauna Hugh, RN Outcome: Adequate for Discharge 11/26/2022 0821 by Shauna Hugh, RN Outcome: Progressing   Problem: Education: Goal: Understanding of CV disease, CV risk reduction, and recovery process will improve 11/26/2022 1312 by Shauna Hugh, RN Outcome: Adequate for Discharge 11/26/2022 0821 by Shauna Hugh, RN Outcome: Progressing Goal: Individualized Educational Video(s) 11/26/2022 1312 by Shauna Hugh, RN Outcome: Adequate for Discharge 11/26/2022 0821 by Shauna Hugh, RN Outcome: Progressing   Problem: Activity: Goal: Ability to return to baseline activity level will improve 11/26/2022 1312 by Shauna Hugh, RN Outcome: Adequate for Discharge 11/26/2022 [phone] by Shauna Hugh, RN Outcome: Progressing   Problem: Cardiovascular: Goal: Ability to achieve and maintain adequate cardiovascular perfusion will improve 11/26/2022 1312 by Shauna Hugh,  RN Outcome: Adequate for Discharge 11/26/2022 0821 by Shauna Hugh, RN Outcome: Progressing Goal: Vascular access site(s) Level 0-1 will be maintained 11/26/2022 1312 by Shauna Hugh, RN Outcome: Adequate for Discharge 11/26/2022 0821 by Shauna Hugh, RN Outcome: Progressing   Problem: Health Behavior/Discharge Planning: Goal: Ability to safely manage health-related needs after  discharge will improve 11/26/2022 1312 by Shauna Hugh, RN Outcome: Adequate for Discharge 11/26/2022 [phone] by Shauna Hugh, RN Outcome: Progressing

## 2022-11-26 NOTE — Consult Note (Signed)
   [facility] CM Inpatient Consult   11/26/2022  Dawn Sherman 10-30-1955 [id number]  Orientation with [name], [facility] Liaison for review.   Location: [facility] Liaison met with pt via bedside [name]).   [facility] [facility]) [place] Patient: Insurance ([name])    Primary Care Provider:  [name], MD with [place] [facility]   Patient screened for readmission hospitalization with noted extreme high risk risk score for unplanned readmission risk with 4 IP and 1 ED within the last 6 months.  [facility] liaison will assess for potential [facility]) Care Management service needs for post hospital transition for care coordination. Spoke with pt via bedside nd explained services related to care management and [name] RN care coordinator. Pt receptive to ongoing follow up call and  was given an appointment reminder card and 24 hours Nurse Advise Line magnet. Pt discharging today with [facility] services and has a good support system and transportation to all medical appointments.     Plan:  HIPAA verified and [name] RN will continue to follow ongoing disposition to assess for post hospital community care coordination/management needs.  Referral request for community care coordination:  risk score, and admissions over th last 6 months.   [place] does not replace or interfere with any arrangements made by the Inpatient Transition of Care team.   For questions contact:    Raina Mina, RN, [place] Hours M-F 8:00 am to 5 pm [phone] mobile [phone] [Office toll free line][facility] Office Hours are M-F 8:30 - 5 pm 24 hour nurse advise line [phone] Conceirge  [email or web address]

## 2022-11-26 NOTE — Telephone Encounter (Signed)
Pt called in for a hospital f/u with provider. Pt its booked for 4/8 @11 :00am

## 2022-11-27 ENCOUNTER — Telehealth: Payer: Self-pay | Admitting: *Deleted

## 2022-11-27 ENCOUNTER — Ambulatory Visit: Payer: Self-pay

## 2022-11-27 NOTE — Chronic Care Management (AMB) (Signed)
   11/27/2022  Dawn Sherman 30-Apr-1956 [id number]   Reason for Encounter: Patient is not currently enrolled in the CCM program. CCM status changed to previously enrolled.   Horris Latino RN Care Manager/Chronic Care Management [phone]

## 2022-11-27 NOTE — Transitions of Care (Post Inpatient/ED Visit) (Signed)
   11/27/2022  Name: Dawn Sherman MRN: [id number] DOB: 1955-10-02  Today's TOC FU Call Status: Today's TOC FU Call Status:: Successful TOC FU Call Competed TOC FU Call Complete Date: 11/27/22  Transition Care Management Follow-up Telephone Call Date of Discharge: 11/26/22 Discharge Facility: [facility]) Type of Discharge: Inpatient Admission Primary Inpatient Discharge Diagnosis:: Acute on chronic combined systolic and diastolic CHF (congestive heart failure How have you been since you were released from the hospital?: Better (I slept real good in my own bed) Any questions or concerns?: No  Items Reviewed: Did you receive and understand the discharge instructions provided?: Yes Medications obtained and verified?: Yes (Medications Reviewed) Any new allergies since your discharge?: No Dietary orders reviewed?: No Do you have support at home?: Yes People in Home: spouse Name of Support/Comfort Primary Source: [facility] and Equipment/Supplies: [place] Ordered?: No Any new equipment or medical supplies ordered?: No  Functional Questionnaire: Do you need assistance with bathing/showering or dressing?: No Do you need assistance with meal preparation?: No Do you need assistance with eating?: No Do you have difficulty maintaining continence: No Do you need assistance with getting out of bed/getting out of a chair/moving?: No Do you have difficulty managing or taking your medications?: No  Follow up appointments reviewed: PCP Follow-up appointment confirmed?: Yes Date of PCP follow-up appointment?: 12/02/22 Follow-up Provider: Dr [name] 11:00 [facility] Follow-up appointment confirmed?: Yes Date of Specialist follow-up appointment?: 12/05/22 Follow-Up Specialty Provider:: Dr Aundra Dubin 10:45 Do you need transportation to your follow-up appointment?: No Do you understand care options if your condition(s) worsen?:  No-reviewed care options from [facility]  SDOH Interventions Today    Flowsheet Row Most Recent Value  SDOH Interventions   Food Insecurity Interventions Intervention Not Indicated  Housing Interventions Intervention Not Indicated  Transportation Interventions Intervention Not Indicated     Patient has placed a call to the nephrologist to make an appt and is waiting on a call back.  Interventions Today    Flowsheet Row Most Recent Value  Chronic Disease   Chronic disease during today's visit Congestive Heart Failure (CHF)  General Interventions   General Interventions Discussed/Reviewed Doctor Visits, General Interventions Reviewed, General Interventions Discussed  Doctor Visits Discussed/Reviewed Doctor Visits Reviewed, Doctor Visits Discussed, Specialist  PCP/Specialist Visits Compliance with follow-up visit      TOC Interventions Today    Flowsheet Row Most Recent Value  TOC Interventions   TOC Interventions Discussed/Reviewed TOC Interventions Discussed, TOC Interventions Reviewed        [place] Management [phone]

## 2022-11-28 ENCOUNTER — Telehealth: Payer: Self-pay | Admitting: *Deleted

## 2022-11-28 ENCOUNTER — Other Ambulatory Visit: Payer: Self-pay | Admitting: Internal Medicine

## 2022-11-28 NOTE — Progress Notes (Signed)
  Care Coordination  Outreach Note  11/28/2022 Name: Dawn Sherman MRN: [id number] DOB: Sep 09, 1955   Care Coordination Outreach Attempts: An unsuccessful telephone outreach was attempted today to offer the patient information about available care coordination services as a benefit of their health plan.   Follow Up Plan:  Additional outreach attempts will be made to offer the patient care coordination information and services.   Encounter Outcome:  No Answer  Julian Hy, [place] Direct Dial: [phone]

## 2022-11-29 NOTE — Telephone Encounter (Signed)
I do not see a specific reason was stopped.  It was a new medication for her - back in December.  If she has been off and not taking, would like to get a tsch check on her.  Then can determine - best dose or if needed.  If she is scheduled to have labs drawn anywhere - can add on if possible.

## 2022-11-29 NOTE — Telephone Encounter (Signed)
LM for patient but wanted to confirm with you that you were not aware of a specific reason that this was d/c'd in the hospital

## 2022-12-02 ENCOUNTER — Ambulatory Visit (INDEPENDENT_AMBULATORY_CARE_PROVIDER_SITE_OTHER): Payer: Medicare Other | Admitting: Internal Medicine

## 2022-12-02 VITALS — BP 128/78 | HR 78 | Temp 98.3°F | Resp 16 | Ht 65.0 in | Wt 123.8 lb

## 2022-12-02 DIAGNOSIS — N184 Chronic kidney disease, stage 4 (severe): Secondary | ICD-10-CM | POA: Diagnosis not present

## 2022-12-02 DIAGNOSIS — D649 Anemia, unspecified: Secondary | ICD-10-CM

## 2022-12-02 DIAGNOSIS — E039 Hypothyroidism, unspecified: Secondary | ICD-10-CM

## 2022-12-02 DIAGNOSIS — F320 Major depressive disorder, single episode, mild: Secondary | ICD-10-CM

## 2022-12-02 DIAGNOSIS — Z720 Tobacco use: Secondary | ICD-10-CM

## 2022-12-02 DIAGNOSIS — E875 Hyperkalemia: Secondary | ICD-10-CM

## 2022-12-02 DIAGNOSIS — E538 Deficiency of other specified B group vitamins: Secondary | ICD-10-CM

## 2022-12-02 DIAGNOSIS — E7800 Pure hypercholesterolemia, unspecified: Secondary | ICD-10-CM

## 2022-12-02 DIAGNOSIS — I480 Paroxysmal atrial fibrillation: Secondary | ICD-10-CM

## 2022-12-02 DIAGNOSIS — I10 Essential (primary) hypertension: Secondary | ICD-10-CM

## 2022-12-02 DIAGNOSIS — K219 Gastro-esophageal reflux disease without esophagitis: Secondary | ICD-10-CM

## 2022-12-02 DIAGNOSIS — J439 Emphysema, unspecified: Secondary | ICD-10-CM

## 2022-12-02 DIAGNOSIS — I5020 Unspecified systolic (congestive) heart failure: Secondary | ICD-10-CM

## 2022-12-02 LAB — CBC WITH DIFFERENTIAL/PLATELET
Basophils Absolute: 0.1 10*3/uL (ref 0.0–0.1)
Basophils Relative: 0.9 % (ref 0.0–3.0)
Eosinophils Absolute: 0.1 10*3/uL (ref 0.0–0.7)
Eosinophils Relative: 1.5 % (ref 0.0–5.0)
HCT: 28.5 % — ABNORMAL LOW (ref 36.0–46.0)
Hemoglobin: 9.1 g/dL — ABNORMAL LOW (ref 12.0–15.0)
Lymphocytes Relative: 9.6 % — ABNORMAL LOW (ref 12.0–46.0)
Lymphs Abs: 0.7 10*3/uL (ref 0.7–4.0)
MCHC: 31.9 g/dL (ref 30.0–36.0)
MCV: 85.2 fl (ref 78.0–100.0)
Monocytes Absolute: 0.7 10*3/uL (ref 0.1–1.0)
Monocytes Relative: 10.6 % (ref 3.0–12.0)
Neutro Abs: 5.3 10*3/uL (ref 1.4–7.7)
Neutrophils Relative %: 77.4 % — ABNORMAL HIGH (ref 43.0–77.0)
Platelets: 365 10*3/uL (ref 150.0–400.0)
RBC: 3.34 Mil/uL — ABNORMAL LOW (ref 3.87–5.11)
RDW: 26.7 % — ABNORMAL HIGH (ref 11.5–15.5)
WBC: 6.8 10*3/uL (ref 4.0–10.5)

## 2022-12-02 LAB — BASIC METABOLIC PANEL
BUN: 32 mg/dL — ABNORMAL HIGH (ref 6–23)
CO2: 23 mEq/L (ref 19–32)
Calcium: 8.8 mg/dL (ref 8.4–10.5)
Chloride: 100 mEq/L (ref 96–112)
Creatinine, Ser: 2.87 mg/dL — ABNORMAL HIGH (ref 0.40–1.20)
GFR: 16.45 mL/min — ABNORMAL LOW (ref >60.00)
Glucose, Bld: 75 mg/dL (ref 70–99)
Potassium: 5.2 mEq/L — ABNORMAL HIGH (ref 3.5–5.1)
Sodium: 135 mEq/L (ref 135–145)

## 2022-12-02 LAB — IBC + FERRITIN
Ferritin: 112.6 ng/mL (ref 10.0–291.0)
Iron: 45 ug/dL (ref 42–145)
Saturation Ratios: 11.4 % — ABNORMAL LOW (ref 20.0–50.0)
TIBC: 394.8 ug/dL (ref 250.0–450.0)
Transferrin: 282 mg/dL (ref 212.0–360.0)

## 2022-12-02 LAB — VITAMIN B12: Vitamin B-12: 520 pg/mL (ref 211–911)

## 2022-12-02 LAB — TSH: TSH: 165.45 u[IU]/mL — ABNORMAL HIGH (ref 0.35–5.50)

## 2022-12-02 MED ORDER — LEVOTHYROXINE SODIUM 50 MCG PO TABS
50.0000 ug | ORAL_TABLET | Freq: Every day | ORAL | 3 refills | Status: DC
Start: 2022-12-02 — End: 2023-01-03

## 2022-12-02 NOTE — Progress Notes (Unsigned)
Subjective:    Patient ID: Dawn Sherman, female    DOB: 07-25-1956, 67 y.o.   MRN: [id number]  Patient here for No chief complaint on file.   HPI Here for hospital follow up - 11/12/22 - 11/26/22 -    Past Medical History:  Diagnosis Date   B12 deficiency    Cardiomyopathy    a. 09/2022 Echo: EF 35-40%; b. 09/2022 MV: apical defect w/ mild peri-infarct ischemia. EF 43%.   Chronic HFrEF (heart failure with reduced ejection fraction)    a. 01/2020 Echo: EF 30-35%; b. 07/2020 Echo: EF 50-55%; c. 06/2022 Echo: EF 50-55%; d. 09/2022 Echo: EF 35-40%, globh HK, mod reduced RV fxn, sev BAE, mod MR.   CKD (chronic kidney disease), stage IV    COPD (chronic obstructive pulmonary disease)    Depression    Endometriosis    Hypertension    Mixed hyperlipidemia    Moderate mitral regurgitation    Normocytic anemia    PAF (paroxysmal atrial fibrillation)    a. CHA2DS2VASc = 4-->eliquis 2.5 bid.   Paroxymsal Atrial flutter (HCC)    Tobacco abuse    Past Surgical History:  Procedure Laterality Date   ABDOMINAL HYSTERECTOMY     BREAST BIOPSY Right 2015   neg-  FIBROADENOMA   CENTRAL LINE INSERTION  11/15/2022   Procedure: CENTRAL LINE INSERTION;  Surgeon: Yvonne Kendall, MD;  Location: [facility];  Service: Cardiovascular;;   COLONOSCOPY WITH PROPOFOL N/A 11/24/2022   Procedure: COLONOSCOPY WITH PROPOFOL;  Surgeon: Regis Bill, MD;  Location: [facility];  Service: Endoscopy;  Laterality: N/A;   COLONOSCOPY WITH PROPOFOL N/A 11/23/2022   Procedure: COLONOSCOPY WITH PROPOFOL;  Surgeon: Regis Bill, MD;  Location: [facility];  Service: Endoscopy;  Laterality: N/A;   ESOPHAGOGASTRODUODENOSCOPY (EGD) WITH PROPOFOL N/A 11/23/2022   Procedure: ESOPHAGOGASTRODUODENOSCOPY (EGD) WITH PROPOFOL;  Surgeon: Regis Bill, MD;  Location: [facility];  Service: Endoscopy;  Laterality: N/A;   RIGHT HEART CATH N/A 11/15/2022   Procedure: RIGHT HEART CATH;  Surgeon: Yvonne Kendall, MD;  Location: [facility];  Service: Cardiovascular;  Laterality: N/A;   RIGHT/LEFT HEART CATH AND CORONARY ANGIOGRAPHY N/A 11/25/2022   Procedure: RIGHT/LEFT HEART CATH AND CORONARY ANGIOGRAPHY;  Surgeon: Iran Ouch, MD;  Location: [facility];  Service: Cardiovascular;  Laterality: N/A;   TAH/RSO  1999   secondary to bleeding and endometriosis (Dr Haskel Khan)   Family History  Problem Relation Age of Onset   Hypercholesterolemia Mother    Rheum arthritis Father    Rheum arthritis Daughter    Fibromyalgia Daughter    Breast cancer Neg Hx    Colon cancer Neg Hx    Social History   Socioeconomic History   Marital status: Married    Spouse name: Not on file   Number of children: Not on file   Years of education: Not on file   Highest education level: Not on file  Occupational History   Not on file  Tobacco Use   Smoking status: Former    Packs/day: 0.25    Years: 40.00    Additional pack years: 0.00    Total pack years: 10.00    Types: Cigarettes   Smokeless tobacco: Never   Tobacco comments:    Patient quit smoking recently   Vaping Use   Vaping Use: Never used  Substance and Sexual Activity   Alcohol use: Not Currently    Alcohol/week: 0.0 standard drinks of  alcohol    Comment: occasional   Drug use: No   Sexual activity: Not Currently  Other Topics Concern   Not on file  Social History Narrative   Lives in [place] with her husband.  She is retired from [employer]	[place].  She does not routinely exercise.   Social Determinants of Health   Financial Resource Strain: Low Risk  (11/27/2021)   Overall Financial Resource Strain (CARDIA)    Difficulty of Paying Living Expenses: Not hard at all  Food Insecurity: No Food Insecurity (11/27/2022)   Hunger Vital Sign    Worried About Running Out of Food in the Last Year: Never true    Ran Out of Food in the Last Year: Never true  Transportation Needs: No Transportation Needs  (11/27/2022)   PRAPARE - Administrator, Civil Service (Medical): No    Lack of Transportation (Non-Medical): No  Physical Activity: Sufficiently Active (11/27/2021)   Exercise Vital Sign    Days of Exercise per Week: 7 days    Minutes of Exercise per Session: 30 min  Stress: No Stress Concern Present (11/27/2021)   [employer] of Occupational Health - Occupational Stress Questionnaire    Feeling of Stress : Not at all  Social Connections: Unknown (11/27/2021)   Social Connection and Isolation Panel [NHANES]    Frequency of Communication with Friends and Family: Not on file    Frequency of Social Gatherings with Friends and Family: Not on file    Attends Religious Services: Not on file    Active Member of Clubs or Organizations: Not on file    Attends Banker Meetings: Not on file    Marital Status: Married     Review of Systems     Objective:     There were no vitals taken for this visit. Wt Readings from Last 3 Encounters:  11/26/22 129 lb 9.6 oz (58.8 kg)  11/12/22 132 lb 12.8 oz (60.2 kg)  11/05/22 128 lb 8 oz (58.3 kg)    Physical Exam   Outpatient Encounter Medications as of 12/02/2022  Medication Sig   acetaminophen (TYLENOL) 650 MG CR tablet Take 650 mg by mouth every 8 (eight) hours as needed for pain or fever.   albuterol (VENTOLIN HFA) 108 (90 Base) MCG/ACT inhaler Inhale 2 puffs into the lungs every 6 (six) hours as needed for wheezing or shortness of breath.   amiodarone (PACERONE) 200 MG tablet Take 1 tablet (200 mg total) by mouth daily.   apixaban (ELIQUIS) 2.5 MG TABS tablet Take 1 tablet (2.5 mg total) by mouth 2 (two) times daily.   atorvastatin (LIPITOR) 40 MG tablet Take 1 tablet (40 mg total) by mouth daily.   buPROPion (WELLBUTRIN XL) 150 MG 24 hr tablet Take 1 tablet (150 mg total) by mouth daily.   calcium carbonate (OS-CAL - DOSED IN MG OF ELEMENTAL CALCIUM) 1250 (500 Ca) MG tablet Take 1 tablet (500 mg of elemental calcium  total) by mouth 3 (three) times daily with meals.   carvedilol (COREG) 6.25 MG tablet Take 1 tablet (6.25 mg total) by mouth 2 (two) times daily with a meal.   FARXIGA 10 MG TABS tablet Take 10 mg by mouth daily.   Fluticasone-Umeclidin-Vilant (TRELEGY ELLIPTA) 100-62.5-25 MCG/ACT AEPB Inhale 1 puff into the lungs daily.   isosorbide-hydrALAZINE (BIDIL) 20-37.5 MG tablet Take 1 tablet by mouth 3 (three) times daily.   loratadine (CLARITIN) 10 MG tablet Take 10 mg by mouth daily.  omeprazole (PRILOSEC) 20 MG capsule Take 20 mg by mouth daily.   torsemide (DEMADEX) 20 MG tablet Take 2 tablets (40 mg) by mouth once daily. You may take an extra 2 tablets (40 mg) in the afternoon as needed for leg swelling   venlafaxine XR (EFFEXOR-XR) 150 MG 24 hr capsule TAKE ONE CAPSULE BY MOUTH EVERY DAY   No facility-administered encounter medications on file as of 12/02/2022.     Lab Results  Component Value Date   WBC 8.7 11/26/2022   HGB 8.7 (L) 11/26/2022   HCT 29.2 (L) 11/26/2022   PLT 258 11/26/2022   GLUCOSE 138 (H) 11/26/2022   CHOL 175 09/24/2022   TRIG 46 09/24/2022   HDL 87 09/24/2022   LDLDIRECT 53.3 10/14/2012   LDLCALC 79 09/24/2022   ALT 20 11/14/2022   AST 24 11/14/2022   NA 135 11/26/2022   K 5.7 (H) 11/26/2022   CL 106 11/26/2022   CREATININE 2.81 (H) 11/26/2022   BUN 51 (H) 11/26/2022   CO2 22 11/26/2022   TSH 12.62 (H) 07/26/2022   INR 1.2 11/21/2022   HGBA1C 6.0 (H) 11/14/2022    DG Chest Port 1 View  Result Date: 11/15/2022 CLINICAL DATA:  Central venous catheter in place. EXAM: PORTABLE CHEST 1 VIEW COMPARISON:  Chest radiograph 11/12/2022 and earlier FINDINGS: Of note, the patient is mildly rotated on this portable examination. Interval placement of right IJ central venous catheter with the tip projecting at the level of the superior cavoatrial junction. Stable enlarged cardiac silhouette. New moderate layering right pleural effusion. No left pleural effusion. Mild  diffuse hazy interstitial thickening. No pneumothorax. Old bilateral rib fractures. No acute osseous abnormality. Rightward curvature of the distal thoracic spine. IMPRESSION: 1. Interval placement of right IJ central venous catheter with the tip projecting at the level of the superior cavoatrial junction. 2. Moderate layering right pleural effusion, increased compared to most recent exam. No left pleural effusion. 3. Mild pulmonary edema. Electronically Signed   By: Sherron Ales M.D.   On: 11/15/2022 14:14   CARDIAC CATHETERIZATION  Result Date: 11/15/2022 Conclusions: Severely elevated left heart, right heart, and pulmonary artery pressure. Mildly reduced Fick cardiac output/index.  Cardiac output may be overestimated in the setting of significant anemia. Successful placement of a 66F triple-lumen central venous catheter via the right internal jugular vein. Recommendations: Transfer to stepdown. Initiate milrinone 0.25 mcg/kg/min, with further titration based on blood pressure, urine output, and cooximetry. Hold metoprolol in the setting of low cardiac output requiring inotropic therapy. Resume diuresis. Obtain portable chest radiograph when patient reaches ICU to confirm central line placement. Yvonne Kendall, MD [facility]  ECHOCARDIOGRAM LIMITED  Result Date: 11/14/2022    ECHOCARDIOGRAM LIMITED REPORT   Patient Name:   KEMA REDING Date of Exam: 11/14/2022 Medical Rec #:  [id number]     Height:       65.0 in Accession #:    [id number]    Weight:       122.4 lb Date of Birth:  [birth year]    BSA:          1.605 m Patient Age:    66 years      BP:           137/77 mmHg Patient Gender: F             HR:           74 bpm. Exam Location:  [facility] Procedure: 2D Echo, Cardiac Doppler and Color  Doppler Indications:     Cardiomyopathy -unspecified I42.9  History:         Patient has prior history of Echocardiogram examinations, most                  recent 09/28/2022.  Sonographer:     Cristela BlueJerry Hege Referring Phys:   [id number] Antonieta IbaIMOTHY J GOLLAN Diagnosing Phys: Julien Nordmannimothy Gollan MD  Sonographer Comments: Image quality was good. IMPRESSIONS  1. Left ventricular ejection fraction, by estimation, is 25 to 30%. The left ventricle has severely decreased function. The left ventricle demonstrates global hypokinesis. There is moderate left ventricular hypertrophy. Left ventricular diastolic parameters are indeterminate.  2. Right ventricular systolic function is moderately reduced. The right ventricular size is normal. There is moderately elevated pulmonary artery systolic pressure. The estimated right ventricular systolic pressure is 45.7 mmHg.  3. Left atrial size was severely dilated.  4. A small pericardial effusion is present.  5. The mitral valve is normal in structure. Severe mitral valve regurgitation. No evidence of mitral stenosis.  6. Tricuspid valve regurgitation is moderate to severe.  7. The aortic valve is normal in structure. Aortic valve regurgitation is not visualized. No aortic stenosis is present.  8. The inferior vena cava is normal in size with greater than 50% respiratory variability, suggesting right atrial pressure of 3 mmHg. FINDINGS  Left Ventricle: Left ventricular ejection fraction, by estimation, is 25 to 30%. The left ventricle has severely decreased function. The left ventricle demonstrates global hypokinesis. The left ventricular internal cavity size was normal in size. There is moderate left ventricular hypertrophy. Left ventricular diastolic parameters are indeterminate. Right Ventricle: The right ventricular size is normal. No increase in right ventricular wall thickness. Right ventricular systolic function is moderately reduced. There is moderately elevated pulmonary artery systolic pressure. The tricuspid regurgitant velocity is 3.19 m/s, and with an assumed right atrial pressure of 5 mmHg, the estimated right ventricular systolic pressure is 45.7 mmHg. Left Atrium: Left atrial size was severely dilated.  Right Atrium: Right atrial size was normal in size. Pericardium: A small pericardial effusion is present. Mitral Valve: The mitral valve is normal in structure. There is mild calcification of the mitral valve leaflet(s). Mild mitral annular calcification. Severe mitral valve regurgitation. No evidence of mitral valve stenosis. Tricuspid Valve: The tricuspid valve is normal in structure. Tricuspid valve regurgitation is moderate to severe. No evidence of tricuspid stenosis. Aortic Valve: The aortic valve is normal in structure. Aortic valve regurgitation is not visualized. No aortic stenosis is present. Pulmonic Valve: The pulmonic valve was normal in structure. Pulmonic valve regurgitation is mild. No evidence of pulmonic stenosis. Aorta: The aortic root is normal in size and structure. Venous: The inferior vena cava is normal in size with greater than 50% respiratory variability, suggesting right atrial pressure of 3 mmHg. IAS/Shunts: No atrial level shunt detected by color flow Doppler. Additional Comments: Spectral Doppler performed. Color Doppler performed.  LEFT VENTRICLE PLAX 2D LVIDd:         5.80 cm LVIDs:         4.70 cm LV PW:         1.20 cm LV IVS:        1.20 cm  LV Volumes (MOD) LV vol d, MOD A2C: 159.0 ml LV vol d, MOD A4C: 127.0 ml LV vol s, MOD A2C: 119.0 ml LV vol s, MOD A4C: 107.0 ml LV SV MOD A2C:     40.0 ml LV SV MOD A4C:  127.0 ml LV SV MOD BP:      35.0 ml RIGHT VENTRICLE RV Basal diam:  3.00 cm RV Mid diam:    2.30 cm LEFT ATRIUM            Index LA Vol (A2C): 130.0 ml 80.98 ml/m LA Vol (A4C): 57.0 ml  35.51 ml/m  TRICUSPID VALVE TR Peak grad:   40.7 mmHg TR Vmax:        319.00 cm/s Julien Nordmann MD Electronically signed by Julien Nordmann MD Signature Date/Time: 11/14/2022/5:02:08 PM    Final (Updated)        Assessment & Plan:  There are no diagnoses linked to this encounter.   Dale Alligator, MD

## 2022-12-03 ENCOUNTER — Encounter: Payer: Self-pay | Admitting: Internal Medicine

## 2022-12-03 DIAGNOSIS — I5032 Chronic diastolic (congestive) heart failure: Secondary | ICD-10-CM | POA: Insufficient documentation

## 2022-12-03 DIAGNOSIS — I509 Heart failure, unspecified: Secondary | ICD-10-CM | POA: Insufficient documentation

## 2022-12-03 NOTE — Assessment & Plan Note (Signed)
Quit smoking in the hospital.  Continues to not smoke.  Follow.

## 2022-12-03 NOTE — Assessment & Plan Note (Signed)
Continue carvedilol, bidil and torsemide as directed.  Also on farxiga.  Follow pressures.  Follow metabolic panel.  Recheck today.  Recheck blood pressure improved.

## 2022-12-03 NOTE — Assessment & Plan Note (Signed)
History of B12 deficiency.  Recheck B12 level.  

## 2022-12-03 NOTE — Assessment & Plan Note (Signed)
On amiodarone.  Continue eliquis.  Appears to be in SR today.  Follow.

## 2022-12-03 NOTE — Assessment & Plan Note (Signed)
Elevated tsh recently.  Not on synthroid.  Check tsh today.

## 2022-12-03 NOTE — Telephone Encounter (Signed)
Please refuse this prescription. We took care of this yesterday at her HFU

## 2022-12-03 NOTE — Assessment & Plan Note (Signed)
No upper symptoms reported. On prilosec.  

## 2022-12-03 NOTE — Assessment & Plan Note (Signed)
Elevated prior to discharge.  On veltassa in hospital.  No longer taking.  Recheck potassium today.  Keep f/u with nephrology.

## 2022-12-03 NOTE — Assessment & Plan Note (Signed)
Continue effexor and wellbutrin. Has good support.  Follow. Stable.

## 2022-12-03 NOTE — Assessment & Plan Note (Signed)
No increased cough or congestion.  Continues toresemide. Follow.   

## 2022-12-03 NOTE — Progress Notes (Signed)
  Care Coordination   Note   12/03/2022 Name: NIKAELA DOM MRN: [id number] DOB: 19-Sep-1955  [name] is a 67 y.o. year old female who sees Dale Calumet, MD for primary care. I reached out to [name] by phone today to offer care coordination services.  Ms. Burdine was given information about Care Coordination services today including:   The Care Coordination services include support from the care team which includes your Nurse Coordinator, Clinical Social Worker, or Pharmacist.  The Care Coordination team is here to help remove barriers to the health concerns and goals most important to you. Care Coordination services are voluntary, and the patient may decline or stop services at any time by request to their care team member.   Care Coordination Consent Status: Patient agreed to services and verbal consent obtained.   Follow up plan:  Telephone appointment with care coordination team member scheduled for:  12/11/2022  Encounter Outcome:  Pt. Scheduled from referral   Burman Nieves, [facility] Care Coordination Care Guide Direct Dial: [phone]

## 2022-12-03 NOTE — Assessment & Plan Note (Signed)
Avoid antiinflammatories.  Will need to monitor kidney function - with torsemide use. GFR as outlined.  Recent LHC.  Recheck metabolic panel today. Keep f/u with nephrology.

## 2022-12-03 NOTE — Assessment & Plan Note (Signed)
She was unable to complete one unit pRBC transfusion due to decompensated heart failure requiring lasix. Repeat echo - EF 25-30% with global hypokinesis.  Recommended continuing carvedilol, farxiga and bidil. Continues on torsemide as directed.  Has f/u with cardiology tomorrow.

## 2022-12-03 NOTE — Assessment & Plan Note (Signed)
Continue lipitor.  Low cholesterol diet and exercise.  Follow lipid panel and liver function tests.   

## 2022-12-03 NOTE — Assessment & Plan Note (Signed)
Hospitalized - 11/12/22 - 11/26/22 - admitted with symptomatic anemia.  Presented with increased sob.  LHC - normal coronaries.  She was unable to complete one unit pRBC transfusion due to decompensated heart failure requiring lasix. EGD 11/23/22 - negative.  Colonoscopy 11/24/22 - poor prep.  No large lesions.  11/26/22 hgb - 8.7.  Discussed need for repeat today. Also discussed given persistent decreased hgb and cardiac history with CKD, etc - need for hematology evaluation.

## 2022-12-04 ENCOUNTER — Telehealth: Payer: Self-pay | Admitting: Internal Medicine

## 2022-12-04 NOTE — Telephone Encounter (Signed)
Contacted Dawn Sherman to schedule their annual wellness visit. Appointment made for 12/06/2022.  Thank you,  [facility] Support [facility] Direct dial  [phone]

## 2022-12-05 ENCOUNTER — Encounter: Payer: Self-pay | Admitting: Cardiology

## 2022-12-05 ENCOUNTER — Telehealth: Payer: Self-pay | Admitting: Pharmacist

## 2022-12-05 ENCOUNTER — Ambulatory Visit: Payer: Medicare Other | Attending: Cardiology | Admitting: Cardiology

## 2022-12-05 VITALS — BP 140/88 | HR 77 | Wt 125.5 lb

## 2022-12-05 DIAGNOSIS — I130 Hypertensive heart and chronic kidney disease with heart failure and stage 1 through stage 4 chronic kidney disease, or unspecified chronic kidney disease: Secondary | ICD-10-CM | POA: Diagnosis not present

## 2022-12-05 DIAGNOSIS — Z79899 Other long term (current) drug therapy: Secondary | ICD-10-CM | POA: Insufficient documentation

## 2022-12-05 DIAGNOSIS — N1832 Chronic kidney disease, stage 3b: Secondary | ICD-10-CM | POA: Diagnosis not present

## 2022-12-05 DIAGNOSIS — J449 Chronic obstructive pulmonary disease, unspecified: Secondary | ICD-10-CM | POA: Diagnosis not present

## 2022-12-05 DIAGNOSIS — I081 Rheumatic disorders of both mitral and tricuspid valves: Secondary | ICD-10-CM | POA: Insufficient documentation

## 2022-12-05 DIAGNOSIS — I5022 Chronic systolic (congestive) heart failure: Secondary | ICD-10-CM | POA: Diagnosis present

## 2022-12-05 DIAGNOSIS — I428 Other cardiomyopathies: Secondary | ICD-10-CM | POA: Insufficient documentation

## 2022-12-05 DIAGNOSIS — E039 Hypothyroidism, unspecified: Secondary | ICD-10-CM | POA: Diagnosis not present

## 2022-12-05 DIAGNOSIS — J432 Centrilobular emphysema: Secondary | ICD-10-CM | POA: Diagnosis not present

## 2022-12-05 DIAGNOSIS — I4581 Long QT syndrome: Secondary | ICD-10-CM | POA: Diagnosis not present

## 2022-12-05 DIAGNOSIS — Z79890 Hormone replacement therapy (postmenopausal): Secondary | ICD-10-CM | POA: Diagnosis not present

## 2022-12-05 DIAGNOSIS — D649 Anemia, unspecified: Secondary | ICD-10-CM | POA: Insufficient documentation

## 2022-12-05 DIAGNOSIS — I480 Paroxysmal atrial fibrillation: Secondary | ICD-10-CM | POA: Insufficient documentation

## 2022-12-05 DIAGNOSIS — N184 Chronic kidney disease, stage 4 (severe): Secondary | ICD-10-CM | POA: Diagnosis not present

## 2022-12-05 MED ORDER — TORSEMIDE 20 MG PO TABS: ORAL_TABLET | ORAL | 3 refills | Status: DC

## 2022-12-05 MED ORDER — CARVEDILOL 6.25 MG PO TABS: 9.3750 mg | ORAL_TABLET | Freq: Two times a day (BID) | ORAL | 1 refills | Status: DC

## 2022-12-05 NOTE — Patient Instructions (Signed)
Your provider requests you have a cardiac MRI (they will call you to schedule your appointment)  Your provider requests you have renal artery dopplers (they will call you to schedule your appointment)  INCREASE torsemide to 60mg  daily alternating with 40mg  daily  INCREASE Carvedilol to 9/375mg  daily  Labs in 1 week  Follow up in 3 weeks  Do the following things EVERYDAY: Weigh yourself in the morning before breakfast. Write it down and keep it in a log. Take your medicines as prescribed Eat low salt foods--Limit salt (sodium) to 2000 mg per day.  Stay as active as you can everyday Limit all fluids for the day to less than 2 liters

## 2022-12-05 NOTE — Progress Notes (Signed)
Contacted patient regarding referral for medication access from Dale Bendon, MD .   Left patient a voicemail to return my call at their convenience  Catie TClearance Coots, PharmD, [place], CPP [facility] [phone]

## 2022-12-05 NOTE — Progress Notes (Addendum)
PCP: Dale [place], MD HF Cardiology: Dr. Gala Romney  Ms. Westlake is a 67 y.o. female with a history of COPD, prior tobacco abuse, paroxysmal atrial fibrillation and flutter, mitral regurgitation, hypertension, hyperlipidemia, stage IV chronic kidney disease, and systolic HF.     HF diagnosed in 6/21. Echo EF 30-35% in setting of new-onset AF.  Repeat echo in December 2021 showed improvement in EF to 50-55% with grade 1 diastolic dysfunction.  In April 2023, in the context of GI illness and weakness, she suffered a fall was found to have a subarachnoid hemorrhage.  Oral anticoagulation was initially held but then subsequently resumed once cleared by neurosurgery.     She required admissions in July 2023 and again in November 2023 in the setting of respiratory failure and volume overload.  Echo November 2023 showed an EF of 50 to 55%.  Unfortunately, she was readmitted in January 2024 with heart failure and pneumonia.  Repeat echo 09/28/22, which was performed during an episode of atrial flutter, showed an EF of 35 to 40%.  Following diuresis, she was discharged home.  Stress test performed in the outpatient setting was intermediate in the setting of an EF of 43% with apical infarct and mild peri-infarct ischemia.  No cath at that time due to CKD 4.    Admitted 11/14/22 with progressive weight gain and SOB. Labs showed acute on chronic normocytic anemia with an H&H of 7.1 and 23.9.  BNP was elevated at 3619.9.  Creatinine stable at 2.76 however, potassium was elevated at 5.5. CXR with vascular congestion and small bilateral pleural effusions.  ECG with NSR. Hstrop 9 and 10.  Echo 11/14/22 with EF 25-30% with global Hk, RV moderately down. Severe MR, mod-severe TR.  LHC/RHC showed normal coronaries, normal filling pressures, and preserved CO.   Patient returns today for post-hospitalization followup of CHF. She is doing fairly well since getting home.  She denies dyspnea walking on flat ground but remains  short of breath walking up stairs/inclines.  Rare lightheadedness with abrupt standing.  No orthopnea/PND.  No palpitations, she is in NSR today.   Labs (4/24): TSH 165, K 5.2, creatinine 2.87  ECG (personally reviewed): NSR, inferolateral TWIs  PMH: 1. Atrial fibrillation: Paroxysmal 2. COPD: Prior smoker.  3. HTN 4. Hyperlipidemia 5. CKD stage 4 6. Subarachnoid hemorrhage: 4/23, traumatic after fall.  7. Chronic systolic CHF: Nonischemic cardiomyopathy.  - Echo (6/21): EF 30-35% in setting of AF/RVR. - Echo (12/21): EF 50-55% - Echo (11/23): EF 50-55%, normal RV, mild AS - Echo (3/24): EF 25-30%, moderate RV dysfunction, severe MR, moderate-severe TR.  - LHC/RHC (4/24): Normal coronaries; mean RA 6, PA 50/17 mean 30, mean PCWP 16, CI 3.7.  8. Anemia: Negative endoscopies 9. Hypothyroidism  Social History   Socioeconomic History   Marital status: Married    Spouse name: Not on file   Number of children: Not on file   Years of education: Not on file   Highest education level: Not on file  Occupational History   Not on file  Tobacco Use   Smoking status: Former    Packs/day: 0.25    Years: 40.00    Additional pack years: 0.00    Total pack years: 10.00    Types: Cigarettes   Smokeless tobacco: Never   Tobacco comments:    Patient quit smoking recently   Vaping Use   Vaping Use: Never used  Substance and Sexual Activity   Alcohol use: Not Currently    Alcohol/week:  0.0 standard drinks of alcohol    Comment: occasional   Drug use: No   Sexual activity: Not Currently  Other Topics Concern   Not on file  Social History Narrative   Lives in [place] with her husband.  She is retired from [employer]	[place].  She does not routinely exercise.   Social Determinants of Health   Financial Resource Strain: Low Risk  (11/27/2021)   Overall Financial Resource Strain (CARDIA)    Difficulty of Paying Living Expenses: Not hard at all  Food Insecurity: No Food Insecurity  (11/27/2022)   Hunger Vital Sign    Worried About Running Out of Food in the Last Year: Never true    Ran Out of Food in the Last Year: Never true  Transportation Needs: No Transportation Needs (11/27/2022)   PRAPARE - Administrator, Civil Service (Medical): No    Lack of Transportation (Non-Medical): No  Physical Activity: Sufficiently Active (11/27/2021)   Exercise Vital Sign    Days of Exercise per Week: 7 days    Minutes of Exercise per Session: 30 min  Stress: No Stress Concern Present (11/27/2021)   [employer] of Occupational Health - Occupational Stress Questionnaire    Feeling of Stress : Not at all  Social Connections: Unknown (11/27/2021)   Social Connection and Isolation Panel [NHANES]    Frequency of Communication with Friends and Family: Not on file    Frequency of Social Gatherings with Friends and Family: Not on file    Attends Religious Services: Not on file    Active Member of Clubs or Organizations: Not on file    Attends Banker Meetings: Not on file    Marital Status: Married  Intimate Partner Violence: Not At Risk (11/12/2022)   Humiliation, Afraid, Rape, and Kick questionnaire    Fear of Current or Ex-Partner: No    Emotionally Abused: No    Physically Abused: No    Sexually Abused: No   Family History  Problem Relation Age of Onset   Hypercholesterolemia Mother    Rheum arthritis Father    Rheum arthritis Daughter    Fibromyalgia Daughter    Breast cancer Neg Hx    Colon cancer Neg Hx    ROS: All systems reviewed and negative except as per HPI.   Current Outpatient Medications  Medication Sig Dispense Refill   acetaminophen (TYLENOL) 650 MG CR tablet Take 650 mg by mouth every 8 (eight) hours as needed for pain or fever.     albuterol (VENTOLIN HFA) 108 (90 Base) MCG/ACT inhaler Inhale 2 puffs into the lungs every 6 (six) hours as needed for wheezing or shortness of breath. 8 g 2   amiodarone (PACERONE) 200 MG tablet Take 1  tablet (200 mg total) by mouth daily. 30 tablet 1   apixaban (ELIQUIS) 2.5 MG TABS tablet Take 1 tablet (2.5 mg total) by mouth 2 (two) times daily. 60 tablet 6   atorvastatin (LIPITOR) 40 MG tablet Take 1 tablet (40 mg total) by mouth daily. 90 tablet 1   buPROPion (WELLBUTRIN XL) 150 MG 24 hr tablet Take 1 tablet (150 mg total) by mouth daily. 90 tablet 1   calcium carbonate (OS-CAL - DOSED IN MG OF ELEMENTAL CALCIUM) 1250 (500 Ca) MG tablet Take 1 tablet (500 mg of elemental calcium total) by mouth 3 (three) times daily with meals. 60 tablet 1   FARXIGA 10 MG TABS tablet Take 10 mg by mouth daily.  Fluticasone-Umeclidin-Vilant (TRELEGY ELLIPTA) 100-62.5-25 MCG/ACT AEPB Inhale 1 puff into the lungs daily. 3 each 3   isosorbide-hydrALAZINE (BIDIL) 20-37.5 MG tablet Take 1 tablet by mouth 3 (three) times daily. 90 tablet 1   levothyroxine (SYNTHROID) 50 MCG tablet Take 1 tablet (50 mcg total) by mouth daily before breakfast. 30 tablet 3   loratadine (CLARITIN) 10 MG tablet Take 10 mg by mouth daily.     omeprazole (PRILOSEC) 20 MG capsule Take 20 mg by mouth daily.     venlafaxine XR (EFFEXOR-XR) 150 MG 24 hr capsule TAKE ONE CAPSULE BY MOUTH EVERY DAY 90 capsule 1   carvedilol (COREG) 6.25 MG tablet Take 1.5 tablets (9.375 mg total) by mouth 2 (two) times daily with a meal. 60 tablet 1   torsemide (DEMADEX) 20 MG tablet Take 2 tablets (40 mg) every other day alternating with 60mg  every other day. 270 tablet 3   No current facility-administered medications for this visit.   BP (!) 140/88 (BP Location: Left Arm, Patient Position: Sitting, Cuff Size: Normal)   Pulse 77   Wt 125 lb 8 oz (56.9 kg)   SpO2 99%   BMI 20.88 kg/m  General: NAD Neck: JVP 8 cm with HJR, no thyromegaly or thyroid nodule.  Lungs: Distant BS CV: Nondisplaced PMI.  Heart regular S1/S2, no S3/S4, 1/6 HSM LLSB/apex.  1+ ankle edema.  No carotid bruit.  Normal pedal pulses.  Abdomen: Soft, nontender, no  hepatosplenomegaly, no distention.  Skin: Intact without lesions or rashes.  Neurologic: Alert and oriented x 3.  Psych: Normal affect. Extremities: No clubbing or cyanosis.  HEENT: Normal.   Assessment/Plan: 1. Chronic systolic CHF: Nonischemic cardiomyopathy.  EF has fluctuated with atrial fibrillation in the past, but echo in 3/24 showed EF 25-30%, moderate RV dysfunction, severe MR, moderate-severe TR in the setting of NSR.  Cath in 4/24 showed no significant coronary disease.  Cause of cardiomyopathy is uncertain, unlikely to be tachycardia-mediated cardiomyopathy as she appears to be staying in NSR on amiodarone. TSH was markedly high recently, hypothyroidism may play a role.  She is mildly volume overloaded on exam today, but diuresis must be careful with CKD stage IV.  - Increase torsemide to 60 mg daily alternating with 40 mg daily.  BMET/BNP, BMET 10 days.  - Continue Farxiga 10 mg daily.  - Continue Bidil 1 tab tid.  - Increase Coreg to 9.375 mg bid.  - No ARB/ACEI/ARNI/spironolactone with CKD stage IV.  - I will arrange for cardiac MRI to assess for infiltrative disease, prior myocarditis.  2. CKD stage IV: Follow BMET carefully, check today.  3. Atrial fibrillation: Paroxysmal.  She appears to be holding NSR on amiodarone.  - Continue amiodarone 200 mg daily.  Check LFTs.  She has started on treatment for hypothyroidism.  Needs regular eye exam.  4. COPD: She has quit smoking, I congratulated her.  5. Hypothyroidism: ?Contribution to CHF.  TSH recently 165.   - Continue Levothyroxine directed by PCP.  6. Anemia: Endoscopies negative in 3/24.  Will need to see hematology.  7. HTN: Associated with right renal atrophy on prior renal US.  - I will order renal artery dopplers to assess for renal artery stenosis.  8. Valvular heart disease: Severe MR and mod-severe TR on 3/24 echo. Murmur not prominent on exam.  - Reassess MR on cardiac MRI.  - Can consider Mitraclip if MR appears  severe on followup imaging.   Marca AnconaDalton Jazsmin Couse 12/05/2022

## 2022-12-06 ENCOUNTER — Telehealth: Payer: Self-pay

## 2022-12-06 NOTE — Telephone Encounter (Signed)
No answer when called for scheduled AWV. Left message to call office back. Okay to reschedule.

## 2022-12-10 ENCOUNTER — Telehealth: Payer: Self-pay

## 2022-12-10 NOTE — Progress Notes (Signed)
   Care Guide Note  12/10/2022 Name: Dawn Sherman MRN: [id number] DOB: Apr 28, 1956  Referred by: Dale [place], MD Reason for referral : Care Coordination (Outreach to schedule with pharm d )   Dawn Sherman is a 67 y.o. year old female who is a primary care patient of Dale Kenneth, MD. [name] was referred to the pharmacist for assistance related to HTN and COPD.    Successful contact was made with the patient to discuss pharmacy services including being ready for the pharmacist to call at least 5 minutes before the scheduled appointment time, to have medication bottles and any blood sugar or blood pressure readings ready for review. The patient agreed to meet with the pharmacist via with the pharmacist via telephone visit on (date/time).  12/31/2022  Penne Lash, RMA Care Guide [address]  [place], [place] [address] Direct Dial: [phone] [email or web address]

## 2022-12-11 ENCOUNTER — Ambulatory Visit: Payer: Self-pay

## 2022-12-11 NOTE — Patient Outreach (Signed)
  Care Coordination   Follow Up Visit Note   12/11/2022 Name: Dawn Sherman MRN: [id number] DOB: [birth year]  Dawn Sherman is a 67 y.o. year old female who sees [name], MD for primary care. I spoke with  Dawn Sherman by phone today.  What matters to the patients health and wellness today?  Per chart review patient post hospitalization from 11/12/22 to 11/26/22 for anemia, PAF.  Patient reports she is doing well. She denies having home health services. Patient denies any fatigue or increase symptoms related to HF, or PAF.  Patient reports having post hospital follow up visits with her primary care provider and cardiologist. She denies any change in her treatment plan. Patient states her doctor is checking her labs regularly. She states she is scheduled for labs again on tomorrow.   Patient reports wearing oxygen mainly at night. She states her blood pressure ranges in the 130's/70's.  She states she checks her blood pressure/pulse/oxygen level daily.  She states she monitors her weight every other day.  Reports current weight is 119.5 lbs.     Goals Addressed             This Visit's Progress    Continued improvement post hospitalization and management/ education of health conditions.       Interventions Today    Flowsheet Row Most Recent Value  Chronic Disease   Chronic disease during today's visit Congestive Heart Failure (CHF), Chronic Kidney Disease/End Stage Renal Disease (ESRD), Other  [name], PAF]  General Interventions   General Interventions Discussed/Reviewed General Interventions Reviewed, Doctor Visits, Labs  [Evaluation of current treatment plan for HF, CKD IV, anemia and patients adherence to plan as established by provider. Assessed for home blood pressure readings and weight. Assessed for heart failure/ a-fib symptoms.]  Labs Kidney Function  [Discussed most recent Hgb and kidney function. confirmed next lab date and follow up with provider]  Doctor Visits  Discussed/Reviewed Doctor Visits Reviewed  [name] scheduled/ upcoming provider visits.  Confirmed patient has transportation to visits.]  Education Interventions   Education Provided Provided Printed Education, Provided Education  [Education article for HF/ anemia sent to patient. Advised patient to continue monitoring weight daily and record. Advised to monitor blood pressure/ pulse rate/ oxygen level daily and record. Discussed when to contact provider for abnormal findings.]  Provided Verbal Education On Other  [Advised not to take anti-inflammatory OTC's to maintain kidney health. Reviewed signs/ symptoms of heart failure and A-fib.]  Pharmacy Interventions   Pharmacy Dicussed/Reviewed Pharmacy Topics Reviewed  [Reviewed medications and discussed compliance.]  Safety Interventions   Safety Discussed/Reviewed Fall Risk  [Assessed for falls and discussed fall prevention]              SDOH assessments and interventions completed:  Yes  SDOH Interventions Today    Flowsheet Row Most Recent Value  SDOH Interventions   Food Insecurity Interventions Intervention Not Indicated  Housing Interventions Intervention Not Indicated  Transportation Interventions Intervention Not Indicated        Care Coordination Interventions:  No, not indicated   Follow up plan: Follow up call scheduled for 01/14/23    Encounter Outcome:  Pt. Visit Completed   [name] RN,BSN,CCM [facility] Care Coordination [phone] direct line

## 2022-12-11 NOTE — Patient Instructions (Signed)
Visit Information  Thank you for taking time to visit with me today. Please don't hesitate to contact me if I can be of assistance to you.   Following are the goals we discussed today:  Continue to monitor your weight daily and record. Notify provider for  3 lbs weight gain overnight or 5 lbs in a week.  Continue to monitor your blood pressure/ pulse and oxygen level daily and record.  Report any new or ongoing symptoms to your provider.  Review attached education articles on heart failure and anemia. Notify your RN case manager if you have any questions.  Keep follow up appointments with providers.    Our next appointment is by telephone on 01/14/23 at 10 am  Please call the care guide team at [phone] if you need to cancel or reschedule your appointment.   If you are experiencing a Mental Health or Behavioral Health Crisis or need someone to talk to, please call the Suicide and Crisis Lifeline: 988 call 1-800-273-TALK (toll free, 24 hour hotline)  The patient verbalized understanding of instructions, educational materials, and care plan provided today and agreed to receive a mailed copy of patient instructions, educational materials, and care plan.   [name] RN,BSN,CCM [facility] Care Coordination [phone] direct line  Heart Failure Action Plan A heart failure action plan helps you understand what to do when you have symptoms of heart failure. Your action plan is a color-coded plan that lists the symptoms to watch for and indicates what actions to take. If you have symptoms in the red zone, you need medical care right away. If you have symptoms in the yellow zone, you are having problems. If you have symptoms in the Alese Furniss zone, you are doing well. Follow the plan that was created by you and your health care provider. Review your plan each time you visit your health care provider. Red zone These signs and symptoms mean you should get medical help right away: You have trouble  breathing when resting. You have a dry cough that is getting worse. You have swelling or pain in your legs or abdomen that is getting worse. You suddenly gain more than 2-3 lb (0.9-1.4 kg) in 24 hours, or more than 5 lb (2.3 kg) in a week. This amount may be more or less depending on your condition. You have trouble staying awake or you feel confused. You have chest pain. You do not have an appetite. You pass out. You have worsening sadness or depression. If you have any of these symptoms, call your local emergency services (911 in the [place] right away. Do not drive yourself to the hospital. Yellow zone These signs and symptoms mean your condition may be getting worse and you should make some changes: You have trouble breathing when you are active, or you need to sleep with your head raised on extra pillows to help you breathe. You have swelling in your legs or abdomen. You gain 2-3 lb (0.9-1.4 kg) in 24 hours, or 5 lb (2.3 kg) in a week. This amount may be more or less depending on your condition. You get tired easily. You have trouble sleeping. You have a dry cough. If you have any of these symptoms: Contact your health care provider within the next day. Your health care provider may adjust your medicines. Leonna Schlee zone These signs mean you are doing well and can continue what you are doing: You do not have shortness of breath. You have very little swelling or no new swelling.  Your weight is stable (no gain or loss). You have a normal activity level. You do not have chest pain or any other new symptoms. Follow these instructions at home: Take over-the-counter and prescription medicines only as told by your health care provider. Weigh yourself daily. Your target weight is __________ lb (__________ kg). Call your health care provider if you gain more than __________ lb (__________ kg) in 24 hours, or more than __________ lb (__________ kg) in a week. Health care provider name:  _____________________________________________________ Health care provider phone number: _____________________________________________________ Eat a heart-healthy diet. Work with a diet and nutrition specialist (dietitian) to create an eating plan that is best for you. Keep all follow-up visits. This is important. Where to find more information American Heart Association: Summary A heart failure action plan helps you understand what to do when you have symptoms of heart failure. Follow the action plan that was created by you and your health care provider. Get help right away if you have any symptoms in the red zone. This information is not intended to replace advice given to you by your health care provider. Make sure you discuss any questions you have with your health care provider. Document Revised: 11/20/2021 Document Reviewed: 03/27/2020 Elsevier Patient Education  2023 Elsevier Inc.  Anemia  Anemia is a condition in which there are not enough red blood cells or hemoglobin in the blood. Hemoglobin is a substance in red blood cells that carries oxygen. When you do not have enough red blood cells or hemoglobin (are anemic), your body cannot get enough oxygen, and your organs may not work properly. As a result, you may feel very tired or have other problems. What are the causes? Common causes of anemia include: Excessive bleeding. Anemia can be caused by excessive bleeding inside or outside the body, including bleeding from the intestines or from heavy menstrual periods in females. Poor nutrition. Long-lasting (chronic) kidney, thyroid, and liver disease. Bone marrow disorders, spleen problems, and blood disorders. Cancer and treatments for cancer. Human immunodeficiency virus (HIV) and acquired immunodeficiency syndrome (AIDS). Infections, medicines, and autoimmune disorders that destroy red blood cells. What are the signs or symptoms? Symptoms of this condition include: Minor  weakness. Dizziness. Headache, or difficulties concentrating and sleeping. Heartbeats that feel irregular or faster than normal (palpitations). Shortness of breath, especially with exercise. Pale skin, lips, and nails, or cold hands and feet. Upset stomach (indigestion) and nausea. Symptoms may occur suddenly or develop slowly. If your anemia is mild, you may not have symptoms. How is this diagnosed? This condition is diagnosed based on blood tests, your medical history, and a physical exam. In some cases, a test may be needed in which cells are removed from the soft tissue inside of a bone and looked at under a microscope (bone marrow biopsy). Your health care provider may also check your stool (feces) for blood and may do more testing to look for the cause of your bleeding. Other tests may include: Imaging tests, such as a CT scan or MRI. A procedure to see inside your esophagus and stomach (endoscopy). The esophagus is the part of the body that moves food from your mouth to your stomach. A procedure to see inside your colon and rectum (colonoscopy). How is this treated? Treatment for this condition depends on the cause. If you continue to lose a lot of blood, you may need to be treated at a hospital. Treatment may include: Taking supplements of iron, vitamin B12, or folic acid. Taking a  hormone medicine (erythropoietin) that can help to stimulate red blood cell growth. Receiving donated blood through an IV (blood transfusion). This may be needed if you lose a lot of blood. Making changes to your diet. Having surgery to remove your spleen. Follow these instructions at home: Take over-the-counter and prescription medicines only as told by your health care provider. Take supplements only as told by your health care provider. Follow any diet instructions that you were given by your health care provider. Keep all follow-up visits. Your health care provider will want to recheck your blood  tests. Contact a health care provider if: You develop new bleeding anywhere in the body. You are very weak. Get help right away if: You are short of breath. You have pain in your abdomen or chest. You are dizzy or feel faint. You have trouble concentrating. You have bloody stools, black stools, or tarry stools. You vomit repeatedly or you vomit up blood. These symptoms may be an emergency. Get help right away. Call 911. Do not wait to see if the symptoms will go away. Do not drive yourself to the hospital. Summary Anemia is a condition in which you do not have enough red blood cells or enough of a substance in your red blood cells that carries oxygen. Symptoms may occur suddenly or develop slowly. If your anemia is mild, you may not have symptoms. This condition is diagnosed with blood tests, a medical history, and a physical exam. Other tests may be needed. Treatment for this condition depends on the cause of the anemia. This information is not intended to replace advice given to you by your health care provider. Make sure you discuss any questions you have with your health care provider. Document Revised: 11/05/2021 Document Reviewed: 11/05/2021 Elsevier Patient Education  2023 [employer].

## 2022-12-12 ENCOUNTER — Ambulatory Visit (INDEPENDENT_AMBULATORY_CARE_PROVIDER_SITE_OTHER): Payer: Medicare Other

## 2022-12-12 VITALS — Ht 65.0 in | Wt 125.0 lb

## 2022-12-12 DIAGNOSIS — Z0000 Encounter for general adult medical examination without abnormal findings: Secondary | ICD-10-CM

## 2022-12-12 NOTE — Progress Notes (Signed)
Subjective:   Dawn Sherman is a 67 y.o. female who presents for Medicare Annual (Subsequent) preventive examination.  Review of Systems    No ROS.  Medicare Wellness Virtual Visit.  Visual/audio telehealth visit, UTA vital signs.   See social history for additional risk factors.   Cardiac Risk Factors include: advanced age (>89men, >1 women);hypertension     Objective:    Today's Vitals   12/12/22 0922  Weight: 125 lb (56.7 kg)  Height: 5\' 5"  (1.651 m)   Body mass index is 20.8 kg/m.     12/12/2022    9:28 AM 11/25/2022   10:58 AM 11/12/2022    9:49 PM 11/12/2022    1:48 PM 09/23/2022    7:00 AM 09/07/2022    5:39 PM 09/07/2022   12:10 PM  Advanced Directives  Does Patient Have a Medical Advance Directive? No No  No No  No  Would patient like information on creating a medical advance directive? No - Patient declined  No - Patient declined  No - Patient declined No - Patient declined     Current Medications (verified) Outpatient Encounter Medications as of 12/12/2022  Medication Sig   acetaminophen (TYLENOL) 650 MG CR tablet Take 650 mg by mouth every 8 (eight) hours as needed for pain or fever.   albuterol (VENTOLIN HFA) 108 (90 Base) MCG/ACT inhaler Inhale 2 puffs into the lungs every 6 (six) hours as needed for wheezing or shortness of breath.   amiodarone (PACERONE) 200 MG tablet Take 1 tablet (200 mg total) by mouth daily.   apixaban (ELIQUIS) 2.5 MG TABS tablet Take 1 tablet (2.5 mg total) by mouth 2 (two) times daily.   atorvastatin (LIPITOR) 40 MG tablet Take 1 tablet (40 mg total) by mouth daily.   buPROPion (WELLBUTRIN XL) 150 MG 24 hr tablet Take 1 tablet (150 mg total) by mouth daily.   calcium carbonate (OS-CAL - DOSED IN MG OF ELEMENTAL CALCIUM) 1250 (500 Ca) MG tablet Take 1 tablet (500 mg of elemental calcium total) by mouth 3 (three) times daily with meals.   carvedilol (COREG) 6.25 MG tablet Take 1.5 tablets (9.375 mg total) by mouth 2 (two) times daily with  a meal.   FARXIGA 10 MG TABS tablet Take 10 mg by mouth daily.   Fluticasone-Umeclidin-Vilant (TRELEGY ELLIPTA) 100-62.5-25 MCG/ACT AEPB Inhale 1 puff into the lungs daily.   isosorbide-hydrALAZINE (BIDIL) 20-37.5 MG tablet Take 1 tablet by mouth 3 (three) times daily.   levothyroxine (SYNTHROID) 50 MCG tablet Take 1 tablet (50 mcg total) by mouth daily before breakfast.   loratadine (CLARITIN) 10 MG tablet Take 10 mg by mouth daily.   omeprazole (PRILOSEC) 20 MG capsule Take 20 mg by mouth daily.   torsemide (DEMADEX) 20 MG tablet Take 2 tablets (40 mg) every other day alternating with 60mg  every other day.   venlafaxine XR (EFFEXOR-XR) 150 MG 24 hr capsule TAKE ONE CAPSULE BY MOUTH EVERY DAY   No facility-administered encounter medications on file as of 12/12/2022.    Allergies (verified) Sulfate, Codeine sulfate, Benicar [olmesartan], Amoxicillin, Clindamycin/lincomycin, Entresto [sacubitril-valsartan], Morphine and related, and Penicillins   History: Past Medical History:  Diagnosis Date   B12 deficiency    Cardiomyopathy    a. 09/2022 Echo: EF 35-40%; b. 09/2022 MV: apical defect w/ mild peri-infarct ischemia. EF 43%.   Chronic HFrEF (heart failure with reduced ejection fraction)    a. 01/2020 Echo: EF 30-35%; b. 07/2020 Echo: EF 50-55%; c. 06/2022 Echo: EF  50-55%; d. 09/2022 Echo: EF 35-40%, globh HK, mod reduced RV fxn, sev BAE, mod MR.   CKD (chronic kidney disease), stage IV    COPD (chronic obstructive pulmonary disease)    Depression    Endometriosis    Hypertension    Mixed hyperlipidemia    Moderate mitral regurgitation    Normocytic anemia    PAF (paroxysmal atrial fibrillation)    a. CHA2DS2VASc = 4-->eliquis 2.5 bid.   Paroxymsal Atrial flutter (HCC)    Tobacco abuse    Past Surgical History:  Procedure Laterality Date   ABDOMINAL HYSTERECTOMY     BREAST BIOPSY Right 2015   neg-  FIBROADENOMA   CENTRAL LINE INSERTION  11/15/2022   Procedure: CENTRAL LINE  INSERTION;  Surgeon: Yvonne Kendall, MD;  Location: [facility];  Service: Cardiovascular;;   COLONOSCOPY WITH PROPOFOL N/A 11/24/2022   Procedure: COLONOSCOPY WITH PROPOFOL;  Surgeon: Regis Bill, MD;  Location: [facility];  Service: Endoscopy;  Laterality: N/A;   COLONOSCOPY WITH PROPOFOL N/A 11/23/2022   Procedure: COLONOSCOPY WITH PROPOFOL;  Surgeon: Regis Bill, MD;  Location: [facility];  Service: Endoscopy;  Laterality: N/A;   ESOPHAGOGASTRODUODENOSCOPY (EGD) WITH PROPOFOL N/A 11/23/2022   Procedure: ESOPHAGOGASTRODUODENOSCOPY (EGD) WITH PROPOFOL;  Surgeon: Regis Bill, MD;  Location: [facility];  Service: Endoscopy;  Laterality: N/A;   RIGHT HEART CATH N/A 11/15/2022   Procedure: RIGHT HEART CATH;  Surgeon: Yvonne Kendall, MD;  Location: [facility];  Service: Cardiovascular;  Laterality: N/A;   RIGHT/LEFT HEART CATH AND CORONARY ANGIOGRAPHY N/A 11/25/2022   Procedure: RIGHT/LEFT HEART CATH AND CORONARY ANGIOGRAPHY;  Surgeon: Iran Ouch, MD;  Location: [facility];  Service: Cardiovascular;  Laterality: N/A;   TAH/RSO  1999   secondary to bleeding and endometriosis (Dr Haskel Khan)   Family History  Problem Relation Age of Onset   Hypercholesterolemia Mother    Rheum arthritis Father    Rheum arthritis Daughter    Fibromyalgia Daughter    Breast cancer Neg Hx    Colon cancer Neg Hx    Social History   Socioeconomic History   Marital status: Married    Spouse name: Not on file   Number of children: Not on file   Years of education: Not on file   Highest education level: Not on file  Occupational History   Not on file  Tobacco Use   Smoking status: Former    Packs/day: 0.25    Years: 40.00    Additional pack years: 0.00    Total pack years: 10.00    Types: Cigarettes   Smokeless tobacco: Never   Tobacco comments:    Patient quit smoking recently   Vaping Use   Vaping Use: Never used  Substance and  Sexual Activity   Alcohol use: Not Currently    Alcohol/week: 0.0 standard drinks of alcohol    Comment: occasional   Drug use: No   Sexual activity: Not Currently  Other Topics Concern   Not on file  Social History Narrative   Lives in [place] with her husband.  She is retired from [employer]	[place].  She does not routinely exercise.   Social Determinants of Health   Financial Resource Strain: Low Risk  (12/12/2022)   Overall Financial Resource Strain (CARDIA)    Difficulty of Paying Living Expenses: Not hard at all  Food Insecurity: No Food Insecurity (12/12/2022)   Hunger Vital Sign    Worried About Running Out  of Food in the Last Year: Never true    Ran Out of Food in the Last Year: Never true  Transportation Needs: No Transportation Needs (12/12/2022)   PRAPARE - Administrator, Civil Service (Medical): No    Lack of Transportation (Non-Medical): No  Physical Activity: Sufficiently Active (12/12/2022)   Exercise Vital Sign    Days of Exercise per Week: 7 days    Minutes of Exercise per Session: 30 min  Stress: No Stress Concern Present (12/12/2022)   [employer] of Occupational Health - Occupational Stress Questionnaire    Feeling of Stress : Not at all  Social Connections: Unknown (12/12/2022)   Social Connection and Isolation Panel [NHANES]    Frequency of Communication with Friends and Family: Not on file    Frequency of Social Gatherings with Friends and Family: Not on file    Attends Religious Services: Not on file    Active Member of Clubs or Organizations: Not on file    Attends Banker Meetings: Not on file    Marital Status: Married    Tobacco Counseling Counseling given: Not Answered Tobacco comments: Patient quit smoking recently    Clinical Intake:  Pre-visit preparation completed: Yes        Diabetes: No  How often do you need to have someone help you when you read instructions, pamphlets, or other written  materials from your doctor or pharmacy?: 1 - Never    Interpreter Needed?: No      Activities of Daily Living    12/12/2022    9:29 AM 11/12/2022    9:00 PM  In your present state of health, do you have any difficulty performing the following activities:  Hearing? 0 0  Vision? 0 0  Difficulty concentrating or making decisions? 0 0  Walking or climbing stairs? 0 0  Dressing or bathing? 0 0  Doing errands, shopping? 1 0  Preparing Food and eating ? N   Using the Toilet? N   In the past six months, have you accidently leaked urine? N   Do you have problems with loss of bowel control? N   Managing your Medications? N   Managing your Finances? Y   Comment Husband manages   Housekeeping or managing your Housekeeping? N     Patient Care Team: Dale Thayer, MD as PCP - General (Internal Medicine) Antonieta Iba, MD as PCP - Cardiology (Cardiology) Lemar Livings Merrily Pew, MD as Consulting Physician (General Surgery)  Indicate any recent Medical Services you may have received from other than [facility] providers in the past year (date may be approximate).     Assessment:   This is a routine wellness examination for Leiloni.  I connected with  [name] on 12/12/22 by a audio enabled telemedicine application and verified that I am speaking with the correct person using two identifiers.  Patient Location: Home  Provider Location: Office/Clinic  I discussed the limitations of evaluation and management by telemedicine. The patient expressed understanding and agreed to proceed.   Hearing/Vision screen Hearing Screening - Comments:: Patient is able to hear conversational tones without difficulty.  No issues reported.   Vision Screening - Comments:: Followed by Lora Paula Wears glasses  Dietary issues and exercise activities discussed: Current Exercise Habits: Home exercise routine, Type of exercise: stretching;walking (Balance exercises), Time (Minutes): 10, Frequency  (Times/Week): 7, Weekly Exercise (Minutes/Week): 70, Intensity: Mild No salt diet Fluid restriction   Goals Addressed  This Visit's Progress    Maintain Healthy Lifestyle       Stay active Healthy diet       Depression Screen    12/12/2022    9:27 AM 10/09/2022   10:59 AM 08/14/2022   10:58 AM 07/16/2022    1:16 PM 05/06/2022    9:02 AM 03/19/2022   12:57 PM 03/05/2022   10:38 AM  PHQ 2/9 Scores  PHQ - 2 Score 0 0 0 0 0 0 0  PHQ- 9 Score    1   2    Fall Risk    12/11/2022    9:14 AM 10/09/2022   10:59 AM 08/14/2022   10:57 AM 07/16/2022    1:16 PM 05/06/2022    9:02 AM  Fall Risk   Falls in the past year? 1 1 0 0 0  Number falls in past yr: 0 0 0 0 0  Injury with Fall? 1 1 0 0 0  Risk for fall due to : History of fall(s) History of fall(s) No Fall Risks No Fall Risks No Fall Risks  Follow up Falls prevention discussed Falls evaluation completed Falls evaluation completed Falls evaluation completed Falls evaluation completed    FALL RISK PREVENTION PERTAINING TO THE HOME: Home free of loose throw rugs in walkways, pet beds, electrical cords, etc? Yes  Adequate lighting in your home to reduce risk of falls? Yes   ASSISTIVE DEVICES UTILIZED TO PREVENT FALLS: Life alert? No  Use of a cane, walker or w/c? No  Grab bars in the bathroom? No  Shower chair or bench in shower? No  Elevated toilet seat or a handicapped toilet? No   TIMED UP AND GO: Was the test performed? No .    Cognitive Function:        12/12/2022    9:31 AM 11/27/2021    2:59 PM  6CIT Screen  What Year? 0 points 0 points  What month? 0 points 0 points  What time? 0 points   Count back from 20 0 points   Months in reverse 0 points 0 points  Repeat phrase 0 points 0 points  Total Score 0 points     Immunizations Immunization History  Administered Date(s) Administered   Fluad Quad(high Dose 65+) 07/23/2021, 05/06/2022   Influenza Inj Mdck Quad With Preservative 08/05/2019    Influenza,inj,Quad PF,6+ Mos 07/02/2018   Influenza-Unspecified 06/22/2020   PFIZER(Purple Top)SARS-COV-2 Vaccination 03/19/2020, 04/09/2020   Pneumococcal Polysaccharide-23 02/13/2020   Zoster Recombinat (Shingrix) 06/19/2021   Screening Tests Health Maintenance  Topic Date Due   COVID-19 Vaccine (3 - Pfizer risk series) 12/18/2022 (Originally 05/07/2020)   Zoster Vaccines- Shingrix (2 of 2) 03/03/2023 (Originally 08/14/2021)   Pneumonia Vaccine 75+ Years old (2 of 2 - PCV) 07/16/2023 (Originally 02/12/2021)   INFLUENZA VACCINE  03/27/2023   Medicare Annual Wellness (AWV)  12/12/2023   MAMMOGRAM  06/17/2024   COLONOSCOPY (Pts 45-107yrs Insurance coverage will need to be confirmed)  11/23/2032   DEXA SCAN  Completed   Hepatitis C Screening  Completed   HPV VACCINES  Aged Out   DTaP/Tdap/Td  Discontinued    Health Maintenance There are no preventive care reminders to display for this patient.  DG Chest 2 View-completed 11/20/22  Hepatitis C Screening: Completed 12/12/21.   Vision Screening: Recommended annual ophthalmology exams for early detection of glaucoma and other disorders of the eye.    Dental Screening: Recommended annual dental exams for proper oral hygiene  Community Resource Referral /  Chronic Care Management: CRR required this visit?  No   CCM required this visit?  No      Plan:     I have personally reviewed and noted the following in the patient's chart:   Medical and social history Use of alcohol, tobacco or illicit drugs  Current medications and supplements including opioid prescriptions. Patient is not currently taking opioid prescriptions. Functional ability and status Nutritional status Physical activity Advanced directives List of other physicians Hospitalizations, surgeries, and ER visits in previous 12 months Vitals Screenings to include cognitive, depression, and falls Referrals and appointments  In addition, I have reviewed and discussed  with patient certain preventive protocols, quality metrics, and best practice recommendations. A written personalized care plan for preventive services as well as general preventive health recommendations were provided to patient.     Cathey Endow, LPN   1/61/0960

## 2022-12-12 NOTE — Patient Instructions (Addendum)
Dawn Sherman , Thank you for taking time to come for your Medicare Wellness Visit. I appreciate your ongoing commitment to your health goals. Please review the following plan we discussed and let me know if I can assist you in the future.   These are the goals we discussed:  Goals Addressed             This Visit's Progress    Maintain Healthy Lifestyle       Stay active Healthy diet         This is a list of the screening recommended for you and due dates:  Health Maintenance  Topic Date Due   COVID-19 Vaccine (3 - Pfizer risk series) 12/18/2022*   Zoster (Shingles) Vaccine (2 of 2) 03/03/2023*   Pneumonia Vaccine (2 of 2 - PCV) 07/16/2023*   Flu Shot  03/27/2023   Medicare Annual Wellness Visit  12/12/2023   Mammogram  06/17/2024   Colon Cancer Screening  11/23/2032   DEXA scan (bone density measurement)  Completed   Hepatitis C Screening: USPSTF Recommendation to screen - Ages 87-79 yo.  Completed   HPV Vaccine  Aged Out   DTaP/Tdap/Td vaccine  Discontinued  *Topic was postponed. The date shown is not the original due date.   Conditions/risks identified: none new  Next appointment: Follow up in one year for your annual wellness visit    Preventive Care 65 Years and Older, Female Preventive care refers to lifestyle choices and visits with your health care provider that can promote health and wellness. What does preventive care include? A yearly physical exam. This is also called an annual well check. Dental exams once or twice a year. Routine eye exams. Ask your health care provider how often you should have your eyes checked. Personal lifestyle choices, including: Daily care of your teeth and gums. Regular physical activity. Eating a healthy diet. Avoiding tobacco and drug use. Limiting alcohol use. Practicing safe sex. Taking low-dose aspirin every day. Taking vitamin and mineral supplements as recommended by your health care provider. What happens during an  annual well check? The services and screenings done by your health care provider during your annual well check will depend on your age, overall health, lifestyle risk factors, and family history of disease. Counseling  Your health care provider may ask you questions about your: Alcohol use. Tobacco use. Drug use. Emotional well-being. Home and relationship well-being. Sexual activity. Eating habits. History of falls. Memory and ability to understand (cognition). Work and work Astronomer. Reproductive health. Screening  You may have the following tests or measurements: Height, weight, and BMI. Blood pressure. Lipid and cholesterol levels. These may be checked every 5 years, or more frequently if you are over 92 years old. Skin check. Lung cancer screening. You may have this screening every year starting at age 55 if you have a 30-pack-year history of smoking and currently smoke or have quit within the past 15 years. Fecal occult blood test (FOBT) of the stool. You may have this test every year starting at age 53. Flexible sigmoidoscopy or colonoscopy. You may have a sigmoidoscopy every 5 years or a colonoscopy every 10 years starting at age 77. Hepatitis C blood test. Hepatitis B blood test. Sexually transmitted disease (STD) testing. Diabetes screening. This is done by checking your blood sugar (glucose) after you have not eaten for a while (fasting). You may have this done every 1-3 years. Bone density scan. This is done to screen for osteoporosis. You may have  this done starting at age 49. Mammogram. This may be done every 1-2 years. Talk to your health care provider about how often you should have regular mammograms. Talk with your health care provider about your test results, treatment options, and if necessary, the need for more tests. Vaccines  Your health care provider may recommend certain vaccines, such as: Influenza vaccine. This is recommended every year. Tetanus,  diphtheria, and acellular pertussis (Tdap, Td) vaccine. You may need a Td booster every 10 years. Zoster vaccine. You may need this after age 34. Pneumococcal 13-valent conjugate (PCV13) vaccine. One dose is recommended after age 76. Pneumococcal polysaccharide (PPSV23) vaccine. One dose is recommended after age 5. Talk to your health care provider about which screenings and vaccines you need and how often you need them. This information is not intended to replace advice given to you by your health care provider. Make sure you discuss any questions you have with your health care provider. Document Released: 09/08/2015 Document Revised: 05/01/2016 Document Reviewed: 06/13/2015 Elsevier Interactive Patient Education  2017 [place] Prevention in the Home Falls can cause injuries. They can happen to people of all ages. There are many things you can do to make your home safe and to help prevent falls. What can I do on the outside of my home? Regularly fix the edges of walkways and driveways and fix any cracks. Remove anything that might make you trip as you walk through a door, such as a raised step or threshold. Trim any bushes or trees on the path to your home. Use bright outdoor lighting. Clear any walking paths of anything that might make someone trip, such as rocks or tools. Regularly check to see if handrails are loose or broken. Make sure that both sides of any steps have handrails. Any raised decks and porches should have guardrails on the edges. Have any leaves, snow, or ice cleared regularly. Use sand or salt on walking paths during winter. Clean up any spills in your garage right away. This includes oil or grease spills. What can I do in the bathroom? Use night lights. Install grab bars by the toilet and in the tub and shower. Do not use towel bars as grab bars. Use non-skid mats or decals in the tub or shower. If you need to sit down in the shower, use a plastic,  non-slip stool. Keep the floor dry. Clean up any water that spills on the floor as soon as it happens. Remove soap buildup in the tub or shower regularly. Attach bath mats securely with double-sided non-slip rug tape. Do not have throw rugs and other things on the floor that can make you trip. What can I do in the bedroom? Use night lights. Make sure that you have a light by your bed that is easy to reach. Do not use any sheets or blankets that are too big for your bed. They should not hang down onto the floor. Have a firm chair that has side arms. You can use this for support while you get dressed. Do not have throw rugs and other things on the floor that can make you trip. What can I do in the kitchen? Clean up any spills right away. Avoid walking on wet floors. Keep items that you use a lot in easy-to-reach places. If you need to reach something above you, use a strong step stool that has a grab bar. Keep electrical cords out of the way. Do not use floor polish or  wax that makes floors slippery. If you must use wax, use non-skid floor wax. Do not have throw rugs and other things on the floor that can make you trip. What can I do with my stairs? Do not leave any items on the stairs. Make sure that there are handrails on both sides of the stairs and use them. Fix handrails that are broken or loose. Make sure that handrails are as long as the stairways. Check any carpeting to make sure that it is firmly attached to the stairs. Fix any carpet that is loose or worn. Avoid having throw rugs at the top or bottom of the stairs. If you do have throw rugs, attach them to the floor with carpet tape. Make sure that you have a light switch at the top of the stairs and the bottom of the stairs. If you do not have them, ask someone to add them for you. What else can I do to help prevent falls? Wear shoes that: Do not have high heels. Have rubber bottoms. Are comfortable and fit you well. Are closed  at the toe. Do not wear sandals. If you use a stepladder: Make sure that it is fully opened. Do not climb a closed stepladder. Make sure that both sides of the stepladder are locked into place. Ask someone to hold it for you, if possible. Clearly mark and make sure that you can see: Any grab bars or handrails. First and last steps. Where the edge of each step is. Use tools that help you move around (mobility aids) if they are needed. These include: Canes. Walkers. Scooters. Crutches. Turn on the lights when you go into a dark area. Replace any light bulbs as soon as they burn out. Set up your furniture so you have a clear path. Avoid moving your furniture around. If any of your floors are uneven, fix them. If there are any pets around you, be aware of where they are. Review your medicines with your doctor. Some medicines can make you feel dizzy. This can increase your chance of falling. Ask your doctor what other things that you can do to help prevent falls. This information is not intended to replace advice given to you by your health care provider. Make sure you discuss any questions you have with your health care provider. Document Released: 06/08/2009 Document Revised: 01/18/2016 Document Reviewed: 09/16/2014 Elsevier Interactive Patient Education  2017 [employer].

## 2022-12-13 ENCOUNTER — Encounter: Payer: Medicare Other | Admitting: Family

## 2022-12-15 NOTE — Progress Notes (Deleted)
Date:  12/15/2022   ID:  [name], DOB February 07, 1956, MRN [id number]  Patient Location:  [address] [place] [address]   Provider location:   [facility], [place] office  PCP:  Dale Elaine, MD  Cardiologist:  [facility]   No chief complaint on file.    History of Present Illness:    [place] is a 67 y.o. female  past medical history of smoker, COPD, quit smoking 1/24 prior alcohol,  chronic kidney disease,  hypertension,  hospital June 2021 with worsening PND orthopnea, congestive heart failure symptoms, " panic attack" Smoker Normal ejection fraction April 2023 EF down to 35% in 2/24 with CHF sx Who presents for follow-up of her diastolic and systolic CHF, PAF  Last seen by myself in clinic 10/08/22 Seen by advanced heart failure clinic December 05, 2022 Torsemide increased to 60 alternating with 40 Continue BiDil, Farxiga, increase Coreg Cardiac MRI ordered On amiodarone to maintain normal sinus rhythm   Echocardiogram April 2023 ejection fraction 65 to 70% Echo July 01, 2022 EF 50 to 55%  hospital admission February 2024  acute on chronic diastolic CHF, respiratory failure, COPD Moderate right pleural effusion on CT Echo ejection fraction 35 to 40% severely dilated left and right atrium, moderate MR Thoracentesis completed Hemoglobin 9.7, down from 11 Went home from hospital 10/01/22  In follow-up today reports legs are getting more swollen since she left the hospital several days ago Compliant with her furosemide 40 daily, often taking 40 twice daily Despite this, swelling has persisted Denies excessive fluid intake at home, reports she has cut way back  Reports that she quit smoking last month  EKG personally reviewed by myself on todays visit Normal sinus rhythm rate 81 bpm diffuse ST-T wave abnormality V3 through V6, limb leads  Other past medical history reviewed  hospital June 2021 Echocardiogram  confirming ejection fraction 30%, global Unable to exclude hypertensive cardiomyopath In the setting of paroxysmal atrial fibrillation  pleural effusion on CT scan  Prior records reviewed hyperkalemia with K as high as 6.9 on 08/04/20 and her [name] has had to be discontinued. ACE and ARB avoided   Past Medical History:  Diagnosis Date   B12 deficiency    Cardiomyopathy    a. 09/2022 Echo: EF 35-40%; b. 09/2022 MV: apical defect w/ mild peri-infarct ischemia. EF 43%.   Chronic HFrEF (heart failure with reduced ejection fraction)    a. 01/2020 Echo: EF 30-35%; b. 07/2020 Echo: EF 50-55%; c. 06/2022 Echo: EF 50-55%; d. 09/2022 Echo: EF 35-40%, globh HK, mod reduced RV fxn, sev BAE, mod MR.   CKD (chronic kidney disease), stage IV    COPD (chronic obstructive pulmonary disease)    Depression    Endometriosis    Hypertension    Mixed hyperlipidemia    Moderate mitral regurgitation    Normocytic anemia    PAF (paroxysmal atrial fibrillation)    a. CHA2DS2VASc = 4-->eliquis 2.5 bid.   Paroxymsal Atrial flutter (HCC)    Tobacco abuse    Past Surgical History:  Procedure Laterality Date   ABDOMINAL HYSTERECTOMY     BREAST BIOPSY Right 2015   neg-  FIBROADENOMA   CENTRAL LINE INSERTION  11/15/2022   Procedure: CENTRAL LINE INSERTION;  Surgeon: Yvonne Kendall, MD;  Location: [facility];  Service: Cardiovascular;;   COLONOSCOPY WITH PROPOFOL N/A 11/24/2022   Procedure: COLONOSCOPY WITH PROPOFOL;  Surgeon: Regis Bill,  MD;  Location: [facility];  Service: Endoscopy;  Laterality: N/A;   COLONOSCOPY WITH PROPOFOL N/A 11/23/2022   Procedure: COLONOSCOPY WITH PROPOFOL;  Surgeon: Regis Bill, MD;  Location: [facility];  Service: Endoscopy;  Laterality: N/A;   ESOPHAGOGASTRODUODENOSCOPY (EGD) WITH PROPOFOL N/A 11/23/2022   Procedure: ESOPHAGOGASTRODUODENOSCOPY (EGD) WITH PROPOFOL;  Surgeon: Regis Bill, MD;  Location: [facility];  Service: Endoscopy;   Laterality: N/A;   RIGHT HEART CATH N/A 11/15/2022   Procedure: RIGHT HEART CATH;  Surgeon: Yvonne Kendall, MD;  Location: [facility];  Service: Cardiovascular;  Laterality: N/A;   RIGHT/LEFT HEART CATH AND CORONARY ANGIOGRAPHY N/A 11/25/2022   Procedure: RIGHT/LEFT HEART CATH AND CORONARY ANGIOGRAPHY;  Surgeon: Iran Ouch, MD;  Location: [facility];  Service: Cardiovascular;  Laterality: N/A;   TAH/RSO  1999   secondary to bleeding and endometriosis (Dr Haskel Khan)      Allergies:   Sulfate, Codeine sulfate, Benicar [olmesartan], Amoxicillin, Clindamycin/lincomycin, Entresto [sacubitril-valsartan], Morphine and related, and Penicillins   Social History   Tobacco Use   Smoking status: Former    Packs/day: 0.25    Years: 40.00    Additional pack years: 0.00    Total pack years: 10.00    Types: Cigarettes   Smokeless tobacco: Never   Tobacco comments:    Patient quit smoking recently   Vaping Use   Vaping Use: Never used  Substance Use Topics   Alcohol use: Not Currently    Alcohol/week: 0.0 standard drinks of alcohol    Comment: occasional   Drug use: No     Current Outpatient Medications on File Prior to Visit  Medication Sig Dispense Refill   acetaminophen (TYLENOL) 650 MG CR tablet Take 650 mg by mouth every 8 (eight) hours as needed for pain or fever.     albuterol (VENTOLIN HFA) 108 (90 Base) MCG/ACT inhaler Inhale 2 puffs into the lungs every 6 (six) hours as needed for wheezing or shortness of breath. 8 g 2   amiodarone (PACERONE) 200 MG tablet Take 1 tablet (200 mg total) by mouth daily. 30 tablet 1   apixaban (ELIQUIS) 2.5 MG TABS tablet Take 1 tablet (2.5 mg total) by mouth 2 (two) times daily. 60 tablet 6   atorvastatin (LIPITOR) 40 MG tablet Take 1 tablet (40 mg total) by mouth daily. 90 tablet 1   buPROPion (WELLBUTRIN XL) 150 MG 24 hr tablet Take 1 tablet (150 mg total) by mouth daily. 90 tablet 1   calcium carbonate (OS-CAL - DOSED IN MG  OF ELEMENTAL CALCIUM) 1250 (500 Ca) MG tablet Take 1 tablet (500 mg of elemental calcium total) by mouth 3 (three) times daily with meals. 60 tablet 1   carvedilol (COREG) 6.25 MG tablet Take 1.5 tablets (9.375 mg total) by mouth 2 (two) times daily with a meal. 60 tablet 1   FARXIGA 10 MG TABS tablet Take 10 mg by mouth daily.     Fluticasone-Umeclidin-Vilant (TRELEGY ELLIPTA) 100-62.5-25 MCG/ACT AEPB Inhale 1 puff into the lungs daily. 3 each 3   isosorbide-hydrALAZINE (BIDIL) 20-37.5 MG tablet Take 1 tablet by mouth 3 (three) times daily. 90 tablet 1   levothyroxine (SYNTHROID) 50 MCG tablet Take 1 tablet (50 mcg total) by mouth daily before breakfast. 30 tablet 3   loratadine (CLARITIN) 10 MG tablet Take 10 mg by mouth daily.     omeprazole (PRILOSEC) 20 MG capsule Take 20 mg by mouth daily.     torsemide (DEMADEX) 20 MG  tablet Take 2 tablets (40 mg) every other day alternating with  every other day. 270 tablet 3   venlafaxine XR (EFFEXOR-XR) 150 MG 24 hr capsule TAKE ONE CAPSULE BY MOUTH EVERY DAY 90 capsule 1   No current facility-administered medications on file prior to visit.     Family Hx: The patient's family history includes Fibromyalgia in her daughter; Hypercholesterolemia in her mother; Rheum arthritis in her daughter and father. There is no history of Breast cancer or Colon cancer.  ROS:   Please see the history of present illness.    Review of Systems  Constitutional: Negative.   HENT: Negative.    Respiratory: Negative.    Cardiovascular: Negative.   Gastrointestinal: Negative.   Musculoskeletal: Negative.   Neurological: Negative.   Psychiatric/Behavioral: Negative.    All other systems reviewed and are negative.    Labs/Other Tests and Data Reviewed:    Recent Labs: 11/12/2022: B Natriuretic Peptide 3,247.0 11/14/2022: ALT 20 11/20/2022: Magnesium 2.1 12/02/2022: BUN 32; Creatinine, Ser 2.87; Hemoglobin 9.1; Platelets 365.0; Potassium 5.2 No hemolysis seen;  Sodium 135; TSH 165.45   Recent Lipid Panel Lab Results  Component Value Date/Time   CHOL 175 09/24/2022 05:25 AM   TRIG 46 09/24/2022 05:25 AM   HDL 87 09/24/2022 05:25 AM   CHOLHDL 2.0 09/24/2022 05:25 AM   LDLCALC 79 09/24/2022 05:25 AM   LDLDIRECT 53.3 10/14/2012 08:21 AM    Wt Readings from Last 3 Encounters:  12/12/22 125 lb (56.7 kg)  12/05/22 125 lb 8 oz (56.9 kg)  12/02/22 123 lb 12.8 oz (56.2 kg)     Exam:    Vital Signs: Vital signs may also be detailed in the HPI There were no vitals taken for this visit.  Constitutional:  oriented to person, place, and time. No distress.  HENT:  Head: Grossly normal Eyes:  no discharge. No scleral icterus.  Neck: No JVD, no carotid bruits  Cardiovascular: Regular rate and rhythm, no murmurs appreciated Trace pitting lower extremity edema bilaterally Pulmonary/Chest: Clear to auscultation bilaterally, no wheezes or rails Dullness at the bases worse on the right than the left Abdominal: Soft.  no distension.  no tenderness.  Musculoskeletal: Normal range of motion Neurological:  normal muscle tone. Coordination normal. No atrophy Skin: Skin warm and dry Psychiatric: normal affect, pleasant   ASSESSMENT & PLAN:    Problem List Items Addressed This Visit   None Dilated cardiomyopathy Ejection fraction normalized on echocardiogram April 2023 Drop in ejection fraction noted on recent hospitalization EF 35% -Did not tolerate ARB, Entresto secondary to hyperkalemia in the setting of renal failure Previously on carvedilol and Farxiga, appears medications were changed off carvedilol placed on diltiazem Given cardiomyopathy will stop the diltiazem, placed back on carvedilol 12.5 twice daily Will stop Lasix and changed to torsemide 40 daily with extra 40 in the afternoon for ankle swelling with potassium Continue Farxiga -Unable to exclude ischemia, not a good candidate for cardiac CTA or cardiac catheterization given renal  dysfunction creatinine greater than 2 -Will recommend pharmacologic Myoview, last performed 3 years ago  Paroxysmal atrial fibrillation Restart carvedilol, continue Eliquis, amiodarone  Chronic kidney disease Stable creatinine >2 Close monitoring on torsemide  Hyperlipidemia Recommend Lipitor daily   Total encounter time more than 30 minutes  Greater than 50% was spent in counseling and coordination of care with the patient    Signed, Julien Nordmann, MD  [facility] [address], [place], [place] [address]

## 2022-12-16 ENCOUNTER — Ambulatory Visit: Payer: Medicare Other | Admitting: Cardiovascular Disease

## 2022-12-16 ENCOUNTER — Inpatient Hospital Stay: Payer: Medicare Other | Attending: Oncology | Admitting: Oncology

## 2022-12-16 ENCOUNTER — Telehealth: Payer: Self-pay | Admitting: Internal Medicine

## 2022-12-16 ENCOUNTER — Encounter: Payer: Self-pay | Admitting: Oncology

## 2022-12-16 VITALS — BP 134/55 | HR 61 | Temp 96.7°F | Resp 18 | Ht 65.0 in | Wt 120.0 lb

## 2022-12-16 DIAGNOSIS — Z86018 Personal history of other benign neoplasm: Secondary | ICD-10-CM | POA: Diagnosis not present

## 2022-12-16 DIAGNOSIS — D631 Anemia in chronic kidney disease: Secondary | ICD-10-CM | POA: Diagnosis not present

## 2022-12-16 DIAGNOSIS — D649 Anemia, unspecified: Secondary | ICD-10-CM | POA: Diagnosis not present

## 2022-12-16 DIAGNOSIS — R0609 Other forms of dyspnea: Secondary | ICD-10-CM

## 2022-12-16 DIAGNOSIS — I480 Paroxysmal atrial fibrillation: Secondary | ICD-10-CM

## 2022-12-16 DIAGNOSIS — N184 Chronic kidney disease, stage 4 (severe): Secondary | ICD-10-CM | POA: Diagnosis not present

## 2022-12-16 DIAGNOSIS — R946 Abnormal results of thyroid function studies: Secondary | ICD-10-CM

## 2022-12-16 DIAGNOSIS — I10 Essential (primary) hypertension: Secondary | ICD-10-CM

## 2022-12-16 DIAGNOSIS — I130 Hypertensive heart and chronic kidney disease with heart failure and stage 1 through stage 4 chronic kidney disease, or unspecified chronic kidney disease: Secondary | ICD-10-CM | POA: Insufficient documentation

## 2022-12-16 DIAGNOSIS — N1832 Chronic kidney disease, stage 3b: Secondary | ICD-10-CM

## 2022-12-16 DIAGNOSIS — I5022 Chronic systolic (congestive) heart failure: Secondary | ICD-10-CM

## 2022-12-16 DIAGNOSIS — Z720 Tobacco use: Secondary | ICD-10-CM

## 2022-12-16 DIAGNOSIS — J432 Centrilobular emphysema: Secondary | ICD-10-CM

## 2022-12-16 NOTE — Telephone Encounter (Signed)
Received notification from Dr Smith Robert that Ms [place] is not taking synthroid. Please confirm if she is taking.  If not, needs to start synthroid q day. If she is taking, make sure she is taking correctly and that she has lab appt scheduled to check tsh

## 2022-12-16 NOTE — Progress Notes (Signed)
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  Patient experienced some generalized weakness and dizziness prior to the fall and felt like she blacked out and fell to the floor hitting the left side of her head and face.  She had a CT head without contrast which showed acute bilateral subarachnoid hemorrhages predominantly along the left sylvian fissure and multiple small foci in the left frontal lobe.  Linear focus of subarachnoid hemorrhage in the right temporal lobe and right frontal lobe.  No significant edema or midline shift.  Subsequent MRI angio head without contrast showed normal intracranial MRA.  Patient was seen by neurosurgery and no surgical intervention is recommended.Platelet counts dropped down to 28 in April 2023 during that hospitalization. Smear review and LDH not indicated of TTP.  No evidence of DIC.  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  HIV hepatitis B hepatitis C and stool H. 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  Patient has history of heart failure with an EF of 25 to 30% with global hypokinesis.  She follows up with cardiology for the same.  She also has CKD stage IV.  She is on low-dose Eliquis  presently for her paroxysmal A-fib.  Patient last seen by me in May 2023 and now referred for anemia.CBC from April 2024 showed H&H of 9.1/28.5 with an MCV of 85.  Ferritin levels were normal at 112 with an iron saturation of 11%.  B12 levels were normal at 520.  TSH elevated during hospitalization at 165.  BMP shows elevated creatinine of 2.8.  Interval history-patient reports feeling better after her recent hospital discharge.  She has baseline fatigue and moderate exertional shortness of breath.  Denies any new symptoms at this time  ECOG PS- 2 Pain scale- 0  Review of systems- Review of Systems  Constitutional:  Positive for malaise/fatigue. Negative for chills, fever and weight loss.  HENT:  Negative for congestion, ear discharge and nosebleeds.   Eyes:  Negative for blurred vision.  Respiratory:  Negative for cough, hemoptysis, sputum production, shortness of breath and wheezing.   Cardiovascular:  Negative for chest pain, palpitations, orthopnea and claudication.  Gastrointestinal:  Negative for abdominal pain, blood in stool, constipation, diarrhea, heartburn, melena, nausea and vomiting.  Genitourinary:  Negative for dysuria, flank pain, frequency, hematuria and urgency.  Musculoskeletal:  Negative for back pain, joint pain and myalgias.  Skin:  Negative for rash.  Neurological:  Negative for dizziness, tingling, focal weakness, seizures, weakness and headaches.  Endo/Heme/Allergies:  Does not bruise/bleed easily.  Psychiatric/Behavioral:  Negative for depression and suicidal ideas. The patient does not have insomnia.       Allergies  Allergen Reactions   Sulfate Rash   Codeine Sulfate Nausea Only   Benicar [Olmesartan]  Talked with patient February 10, 2020, intolerance is unclear, tried several medications around that time and one of them gave her a rash but she is not clear which 1.   Amoxicillin Rash   Clindamycin/Lincomycin Rash   Entresto [Sacubitril-Valsartan] Other  (See Comments)    hyperkalemia   Morphine And Related Rash   Penicillins Rash     Past Medical History:  Diagnosis Date   B12 deficiency    Cardiomyopathy    a. 09/2022 Echo: EF 35-40%; b. 09/2022 MV: apical defect w/ mild peri-infarct ischemia. EF 43%.   Chronic HFrEF (heart failure with reduced ejection fraction)    a. 01/2020 Echo: EF 30-35%; b. 07/2020 Echo: EF 50-55%; c. 06/2022 Echo: EF 50-55%; d. 09/2022 Echo: EF 35-40%, globh HK, mod reduced RV fxn, sev BAE, mod MR.   CKD (chronic kidney disease), stage IV    COPD (chronic obstructive pulmonary disease)    Depression    Endometriosis    Hypertension    Mixed hyperlipidemia    Moderate mitral regurgitation    Normocytic anemia    PAF (paroxysmal atrial fibrillation)    a. CHA2DS2VASc = 4-->eliquis 2.5 bid.   Paroxymsal Atrial flutter (HCC)    Tobacco abuse      Past Surgical History:  Procedure Laterality Date   ABDOMINAL HYSTERECTOMY     BREAST BIOPSY Right 2015   neg-  FIBROADENOMA   CENTRAL LINE INSERTION  11/15/2022   Procedure: CENTRAL LINE INSERTION;  Surgeon: Yvonne Kendall, MD;  Location: [facility] INVASIVE [facility];  Service: Cardiovascular;;   COLONOSCOPY WITH PROPOFOL N/A 11/24/2022   Procedure: COLONOSCOPY WITH PROPOFOL;  Surgeon: Regis Bill, MD;  Location: [facility] ENDOSCOPY;  Service: Endoscopy;  Laterality: N/A;   COLONOSCOPY WITH PROPOFOL N/A 11/23/2022   Procedure: COLONOSCOPY WITH PROPOFOL;  Surgeon: Regis Bill, MD;  Location: [facility] ENDOSCOPY;  Service: Endoscopy;  Laterality: N/A;   ESOPHAGOGASTRODUODENOSCOPY (EGD) WITH PROPOFOL N/A 11/23/2022   Procedure: ESOPHAGOGASTRODUODENOSCOPY (EGD) WITH PROPOFOL;  Surgeon: Regis Bill, MD;  Location: [facility] ENDOSCOPY;  Service: Endoscopy;  Laterality: N/A;   RIGHT HEART CATH N/A 11/15/2022   Procedure: RIGHT HEART CATH;  Surgeon: Yvonne Kendall, MD;  Location: [facility] CV [facility];  Service: Cardiovascular;  Laterality: N/A;   RIGHT/LEFT HEART CATH  AND CORONARY ANGIOGRAPHY N/A 11/25/2022   Procedure: RIGHT/LEFT HEART CATH AND CORONARY ANGIOGRAPHY;  Surgeon: Iran Ouch, MD;  Location: [facility]VAS[facility] CV [facility];  Service: Cardiovascular;  Laterality: N/A;   TAH/RSO  1999   secondary to bleeding and endometriosis (Dr Haskel Khan)    Social History   Socioeconomic History   Marital status: Married    Spouse name: Not on file   Number of children: Not on file   Years of education: Not on file   Highest education level: Not on file  Occupational History   Not on file  Tobacco Use   Smoking status: Former    Packs/day: 0.25    Years: 40.00    Additional pack years: 0.00    Total pack years: 10.00    Types: Cigarettes   Smokeless tobacco: Never   Tobacco comments:    Patient quit smoking recently   Vaping Use   Vaping Use: Never used  Substance and Sexual Activity   Alcohol use: Not Currently    Alcohol/week: 0.0 standard drinks of alcohol    Comment: occasional   Drug use: No   Sexual activity: Not Currently  Other Topics Concern   Not on file  Social History Narrative   Lives in [place] with her husband.  She is retired from [employer]	[place].  She does not routinely exercise.   Social Determinants of Health   Financial Resource Strain: Low Risk  (12/12/2022)   Overall Financial Resource Strain (CARDIA)    Difficulty of Paying Living Expenses: Not hard at all  Food Insecurity: No Food Insecurity (12/12/2022)   Hunger Vital Sign    Worried About Running Out of Food in the Last Year: Never true    Ran Out of Food in the Last Year: Never true  Transportation Needs: No Transportation Needs (12/12/2022)   PRAPARE - Administrator, Civil Service (Medical): No    Lack of Transportation (Non-Medical): No  Physical Activity: Sufficiently Active (12/12/2022)   Exercise Vital Sign    Days of Exercise per Week: 7 days    Minutes of Exercise per Session: 30 min  Stress: No Stress Concern Present (12/12/2022)    [employer] of Occupational Health - Occupational Stress Questionnaire    Feeling of Stress : Not at all  Social Connections: Unknown (12/12/2022)   Social Connection and Isolation Panel [NHANES]    Frequency of Communication with Friends and Family: Not on file    Frequency of Social Gatherings with Friends and Family: Not on file    Attends Religious Services: Not on file    Active Member of Clubs or Organizations: Not on file    Attends Banker Meetings: Not on file    Marital Status: Married  Intimate Partner Violence: Not At Risk (12/12/2022)   Humiliation, Afraid, Rape, and Kick questionnaire    Fear of Current or Ex-Partner: No    Emotionally Abused: No    Physically Abused: No    Sexually Abused: No    Family History  Problem Relation Age of Onset   Hypercholesterolemia Mother    Rheum arthritis Father    Rheum arthritis Daughter    Fibromyalgia Daughter    Breast cancer Neg Hx    Colon cancer Neg Hx      Current Outpatient Medications:    acetaminophen (TYLENOL) 650 MG CR tablet, Take 650 mg by mouth every 8 (eight) hours as needed for pain or fever., Disp: , Rfl:    albuterol (VENTOLIN HFA) 108 (90 Base) MCG/ACT inhaler, Inhale 2 puffs into the lungs every 6 (six) hours as needed for wheezing or shortness of breath., Disp: 8 g, Rfl: 2   amiodarone (PACERONE) 200 MG tablet, Take 1 tablet (200 mg total) by mouth daily., Disp: 30 tablet, Rfl: 1   apixaban (ELIQUIS) 2.5 MG TABS tablet, Take 1 tablet (2.5 mg total) by mouth 2 (two) times daily., Disp: 60 tablet, Rfl: 6   atorvastatin (LIPITOR) 40 MG tablet, Take 1 tablet (40 mg total) by mouth daily., Disp: 90 tablet, Rfl: 1   buPROPion (WELLBUTRIN XL) 150 MG 24 hr tablet, Take 1 tablet (150 mg total) by mouth daily., Disp: 90 tablet, Rfl: 1   calcium carbonate (OS-CAL - DOSED IN MG OF ELEMENTAL CALCIUM) 1250 (500 Ca) MG tablet, Take 1 tablet (500 mg of elemental calcium total) by mouth 3 (three) times daily  with meals., Disp: 60 tablet, Rfl: 1   carvedilol (COREG) 6.25 MG tablet, Take 1.5 tablets (9.375 mg total) by mouth 2 (two) times daily with a meal., Disp: 60 tablet, Rfl: 1   FARXIGA 10 MG TABS tablet, Take 10 mg by mouth daily., Disp: , Rfl:  Fluticasone-Umeclidin-Vilant (TRELEGY ELLIPTA) 100-62.5-25 MCG/ACT AEPB, Inhale 1 puff into the lungs daily., Disp: 3 each, Rfl: 3   isosorbide-hydrALAZINE (BIDIL) 20-37.5 MG tablet, Take 1 tablet by mouth 3 (three) times daily., Disp: 90 tablet, Rfl: 1   levothyroxine (SYNTHROID) 50 MCG tablet, Take 1 tablet (50 mcg total) by mouth daily before breakfast., Disp: 30 tablet, Rfl: 3   loratadine (CLARITIN) 10 MG tablet, Take 10 mg by mouth daily., Disp: , Rfl:    omeprazole (PRILOSEC) 20 MG capsule, Take 20 mg by mouth daily., Disp: , Rfl:    torsemide (DEMADEX) 20 MG tablet, Take 2 tablets (40 mg) every other day alternating with  every other day., Disp: 270 tablet, Rfl: 3   venlafaxine XR (EFFEXOR-XR) 150 MG 24 hr capsule, TAKE ONE CAPSULE BY MOUTH EVERY DAY, Disp: 90 capsule, Rfl: 1  Physical exam: There were no vitals filed for this visit. Physical Exam Eyes:     Comments: Left subconjunctival hemorrhage  Cardiovascular:     Rate and Rhythm: Normal rate and regular rhythm.     Heart sounds: Normal heart sounds.  Pulmonary:     Effort: Pulmonary effort is normal.     Breath sounds: Normal breath sounds.  Abdominal:     General: Bowel sounds are normal.     Palpations: Abdomen is soft.  Skin:    General: Skin is warm and dry.  Neurological:     Mental Status: She is alert and oriented to person, place, and time.         Latest Ref Rng & Units 12/02/2022   12:03 PM  CMP  Glucose 70 - 99 mg/dL 75   BUN 6 - 23 mg/dL 32   Creatinine 4.09 - 1.20 mg/dL 8.11   Sodium 914 - 782 mEq/L 135   Potassium 3.5 - 5.1 mEq/L 5.2 No hemolysis seen   Chloride 96 - 112 mEq/L 100   CO2 19 - 32 mEq/L 23   Calcium 8.4 - 10.5 mg/dL 8.8       Latest  Ref Rng & Units 12/02/2022   12:03 PM  CBC  WBC 4.0 - 10.5 K/uL 6.8   Hemoglobin 12.0 - 15.0 g/dL 9.1   Hematocrit 95.6 - 46.0 % 28.5   Platelets 150.0 - 400.0 K/uL 365.0     No images are attached to the encounter.  CARDIAC CATHETERIZATION  Result Date: 11/25/2022 1.  Normal coronary arteries. 2.  Left ventricular angiography was not performed due to chronic kidney disease.  EF was severely reduced by echo. 3.  Right heart catheterization showed normal RA pressure, mildly elevated wedge pressure, moderate pulmonary hypertension and normal cardiac output. RA: 6 mmHg RV: 50/3 mmHg PW: 16 mmHg PA: 50/17 with a mean of 30 mmHg LVEDP was 15 mmHg PA sat was 59% and an AO sat was 89% Cardiac output is 6.08 with an index of 3.7. Recommendations: The patient's heart failure seems to be optimized on current medications. Will resume Eliquis this evening for atrial fibrillation. Suspect that a significant component of her shortness of breath and hypoxia is likely related to COPD.   DG Chest 2 View  Result Date: 11/20/2022 CLINICAL DATA:  Evaluate pleural effusions. EXAM: CHEST - 2 VIEW COMPARISON:  11/15/2022 FINDINGS: There is a right IJ catheter with tip in the projection of the cavoatrial junction. Stable cardiomediastinal contours. Interval decreased volume of right pleural effusion with improved aeration to the right lung base. Trace septal markings identified within the lower lung zones  compatible with mild edema. No airspace disease. IMPRESSION: 1. Interval decreased volume of right pleural effusion with improved aeration to the right lung base. 2. Mild edema. Electronically Signed   By: Signa Kell M.D.   On: 11/20/2022 15:03     Assessment and plan- Patient is a 67 y.o. female with prior history of ITP now referred for anemia  Normocytic anemia: Patient's hemoglobin was around 11 until December 2023 and since then has gradually drifted down and has remained between 8-9.Likely secondary to chronic  kidney disease.  Although her ferritinLevels were normal at 112 her iron saturation was low at 12%.  I think it would be reasonable to offer her IV iron to keep her iron saturation is close to 20%.  I would recommend 2 doses of Feraheme for her based on her insurance.  Discussed risks and benefits of Feraheme including all but not limited to possible risk of infusion reaction.  Patient understands and agrees to proceed as planned.  I will see her back in 6 weeks with CBC ferritin and iron studies myeloma panel LDH haptoglobin and serum free light chains.  If hemoglobin fails to improve despite trial of IV iron we could consider EPO for her.  During her hospitalization patient was found to have an elevated TSH of 165.  She is not on any thyroid supplementation.  I will reach out to Dr. Lorin Picket regarding this.  Uncontrolled thyroid levels can also contribute to anemia   Visit Diagnosis 1. Normocytic anemia   2. Anemia of chronic kidney failure, stage 4 (severe)      Dr. Owens Shark, MD, MPH [facility] [phone] 12/16/2022 9:41 AM

## 2022-12-16 NOTE — Telephone Encounter (Signed)
Confirmed with patient that she has been taking synthroid 50 mcg q day- has not missed any doses. She is scheduled for labs 5/9. Do you want her TSH checked sooner?

## 2022-12-16 NOTE — Telephone Encounter (Signed)
Continue synthroid.  See if she can recheck tsh next week.

## 2022-12-16 NOTE — Telephone Encounter (Signed)
Patient says she would rather wait until her lab appointment on 5/9. Will continue synthroid

## 2022-12-16 NOTE — Progress Notes (Signed)
No concerns for the provider. 

## 2022-12-17 MED FILL — Ferumoxytol Inj 510 MG/17ML (30 MG/ML) (Elemental Fe): INTRAVENOUS | Qty: 17 | Status: AC

## 2022-12-18 ENCOUNTER — Inpatient Hospital Stay: Payer: Medicare Other

## 2022-12-19 ENCOUNTER — Inpatient Hospital Stay: Payer: Medicare Other

## 2022-12-19 VITALS — BP 135/66 | HR 66 | Temp 97.0°F | Resp 18

## 2022-12-19 DIAGNOSIS — I130 Hypertensive heart and chronic kidney disease with heart failure and stage 1 through stage 4 chronic kidney disease, or unspecified chronic kidney disease: Secondary | ICD-10-CM | POA: Diagnosis not present

## 2022-12-19 DIAGNOSIS — D508 Other iron deficiency anemias: Secondary | ICD-10-CM

## 2022-12-19 MED ORDER — SODIUM CHLORIDE 0.9 % IV SOLN
Freq: Once | INTRAVENOUS | Status: AC
  Filled 2022-12-19: qty 250

## 2022-12-19 MED ORDER — SODIUM CHLORIDE 0.9 % IV SOLN
510.0000 mg | INTRAVENOUS | Status: DC
  Administered 2022-12-19: 510 mg via INTRAVENOUS
  Filled 2022-12-19: qty 510

## 2022-12-24 MED FILL — Ferumoxytol Inj 510 MG/17ML (30 MG/ML) (Elemental Fe): INTRAVENOUS | Qty: 17 | Status: AC

## 2022-12-25 ENCOUNTER — Inpatient Hospital Stay: Payer: Medicare Other

## 2022-12-26 ENCOUNTER — Inpatient Hospital Stay: Payer: Medicare Other | Attending: Oncology

## 2022-12-26 VITALS — BP 143/79 | HR 74 | Temp 99.0°F | Resp 18

## 2022-12-26 DIAGNOSIS — D508 Other iron deficiency anemias: Secondary | ICD-10-CM

## 2022-12-26 DIAGNOSIS — N184 Chronic kidney disease, stage 4 (severe): Secondary | ICD-10-CM | POA: Insufficient documentation

## 2022-12-26 DIAGNOSIS — D631 Anemia in chronic kidney disease: Secondary | ICD-10-CM | POA: Insufficient documentation

## 2022-12-26 DIAGNOSIS — D509 Iron deficiency anemia, unspecified: Secondary | ICD-10-CM | POA: Diagnosis present

## 2022-12-26 MED ORDER — SODIUM CHLORIDE 0.9 % IV SOLN
Freq: Once | INTRAVENOUS | Status: AC
  Filled 2022-12-26: qty 250

## 2022-12-26 MED ORDER — SODIUM CHLORIDE 0.9 % IV SOLN
510.0000 mg | INTRAVENOUS | Status: DC
  Administered 2022-12-26: 510 mg via INTRAVENOUS
  Filled 2022-12-26: qty 510

## 2022-12-31 ENCOUNTER — Other Ambulatory Visit: Payer: Medicare Other | Admitting: Pharmacist

## 2022-12-31 NOTE — Progress Notes (Signed)
12/31/2022 Name: Dawn Sherman MRN: [id number] DOB: 01-Sep-1955  Chief Complaint  Patient presents with   Medication Management   Hypertension   Hyperlipidemia    Dawn Sherman is a 67 y.o. year old female who presented for a telephone visit.   They were referred to the pharmacist by their PCP for assistance in managing medication access.    Subjective:  Care Team: Primary Care Provider: Dale Ahtanum, MD ; Next Scheduled Visit: 01/07/23  Medication Access/Adherence  Current Pharmacy:  [facility] [phone] [facility], [place] - [address] [place] [address] Phone: [phone] Fax: [phone]   Patient reports affordability concerns with their medications: Yes  Patient reports access/transportation concerns to their pharmacy: No  Patient reports adherence concerns with their medications:  No     Hyperlipidemia/ASCVD Risk Reduction  Current lipid lowering medications: atorvastatin 40 mg daily  Antiplatelet regimen: none due to DOAC   Heart Failure:  Current medications:  ACEi/ARB/ARNI: none due to prior intolerance SGLT2i: Farxiga Beta blocker: carvedilol 9.375 mg twice daily Mineralocorticoid Receptor Antagonist: none Diuretic regimen: torsemide alternating 40 mg and 60 mg daily  Additional medications: isosorbide/hydralazine 20/37.5 mg three times daily  Current medication access support: Farxiga through [facility]  Atrial Fibrillation:  Current medications: Rate Control: carvedilol 9.375 mg twice daily Rhythm Control: amiodarone 200 mg daily Anticoagulation Regimen: Eliquis 2.5 mg twice daily   Current medication access support: over income for Eliquis assistance  COPD:  Current medications: Trelegy 100/62.5/25 mcg daily; albuterol HFA PRN  Depression/Anxiety:  Current medications: venlafaxine 150 mg daily, bupropion XR 150 mg daily  Objective:  Lab Results  Component Value Date   HGBA1C 6.0 (H) 11/14/2022     Lab Results  Component Value Date   CREATININE 2.87 (H) 12/02/2022   BUN 32 (H) 12/02/2022   NA 135 12/02/2022   K 5.2 No hemolysis seen (H) 12/02/2022   CL 100 12/02/2022   CO2 23 12/02/2022    Lab Results  Component Value Date   CHOL 175 09/24/2022   HDL 87 09/24/2022   LDLCALC 79 09/24/2022   LDLDIRECT 53.3 10/14/2012   TRIG 46 09/24/2022   CHOLHDL 2.0 09/24/2022    Medications Reviewed Today     Reviewed by Dawn Sherman, CMA (Certified Medical Assistant) on 12/16/22 at [phone]  Med List Status: <None>   Medication Order Taking? Sig Documenting Provider Last Dose Status Informant  acetaminophen (TYLENOL) 650 MG CR tablet [id number] Yes Take 650 mg by mouth every 8 (eight) hours as needed for pain or fever. [provider] Taking Active Self  albuterol (VENTOLIN HFA) 108 (90 Base) MCG/ACT inhaler [id number] Yes Inhale 2 puffs into the lungs every 6 (six) hours as needed for wheezing or shortness of breath. Dale [place], MD Taking Active Self  amiodarone (PACERONE) 200 MG tablet [id number] Yes Take 1 tablet (200 mg total) by mouth daily. Willeen Niece, MD Taking Active Self  apixaban (ELIQUIS) 2.5 MG TABS tablet [id number] Yes Take 1 tablet (2.5 mg total) by mouth 2 (two) times daily. Antonieta Iba, MD Taking Active Self  atorvastatin (LIPITOR) 40 MG tablet [id number] Yes Take 1 tablet (40 mg total) by mouth daily. Dale [place], MD Taking Active Self  buPROPion (WELLBUTRIN XL) 150 MG 24 hr tablet [id number] Yes Take 1 tablet (150 mg total) by mouth daily. Dale Sylvarena, MD Taking Active Self  calcium carbonate (OS-CAL - DOSED IN MG OF ELEMENTAL CALCIUM) 1250 (500  Ca) MG tablet [id number] Yes Take 1 tablet (500 mg of elemental calcium total) by mouth 3 (three) times daily with meals. Enedina Finner, MD Taking Active Self  carvedilol (COREG) 6.25 MG tablet [id number] Yes Take 1.5 tablets (9.375 mg total) by mouth 2 (two) times daily with a meal. Laurey Morale,  MD Taking Active   FARXIGA 10 MG TABS tablet [id number] Yes Take 10 mg by mouth daily. [provider] Taking Active Self  Fluticasone-Umeclidin-Vilant (TRELEGY ELLIPTA) 100-62.5-25 MCG/ACT AEPB [id number] Yes Inhale 1 puff into the lungs daily. Dale [place], MD Taking Active Self           Med Note Janeann Forehand Jun 30, 2022  3:26 PM)    isosorbide-hydrALAZINE (BIDIL) 20-37.5 MG tablet [id number] Yes Take 1 tablet by mouth 3 (three) times daily. Enedina Finner, MD Taking Active   levothyroxine (SYNTHROID) 50 MCG tablet [id number] Yes Take 1 tablet (50 mcg total) by mouth daily before breakfast. Dale [place], MD Taking Active   loratadine (CLARITIN) 10 MG tablet [id number] Yes Take 10 mg by mouth daily. [provider] Taking Active Self  omeprazole (PRILOSEC) 20 MG capsule [id number] Yes Take 20 mg by mouth daily. [provider] Taking Active Self  torsemide (DEMADEX) 20 MG tablet [id number] Yes Take 2 tablets (40 mg) every other day alternating with 60mg  every other day. Laurey Morale, MD Taking Active   venlafaxine XR (EFFEXOR-XR) 150 MG 24 hr capsule [id number] Yes TAKE ONE CAPSULE BY MOUTH EVERY DAY Dale [place], MD Taking Active Self              Assessment/Plan:   Hyperlipidemia/ASCVD Risk Reduction: - Currently controlled.  - Recommend to continue current regimen  Heart Failure: - Currently appropriately managed given renal function and prior intolerances - Recommend to continue current regimen  Atrial Fibrillation: - Currently controlled - Patient over income for Eliquis assistance. Confirms she can continue to afford to fill medication at the pharmacy. Discussed concepts of medication reducing risk of hospitalization/exacerbation - Recommend to continue current regimen at this time  COPD: - Currently controlled.  - Patient over income for Trelegy assistance. Confirms she can continue to afford to fill medication at the pharmacy.   Discussed concepts of medication reducing risk of hospitalization/exacerbation - Recommend to continue current regimen at this time  Depression/Anxiety: - Currently controlled - Recommend to continue current regimen at this time   Follow Up Plan: pharmacy goals met. Follow up with PCP as scheduled  Catie Eppie Gibson, PharmD, BCACP, CPP [facility] [phone]

## 2022-12-31 NOTE — Patient Instructions (Signed)
Dawn Sherman,   It was great talking with you!  Please reach out if you have any future questions or concerns with your medications!  Catie Clearance Coots, PharmD [phone]

## 2023-01-02 ENCOUNTER — Other Ambulatory Visit (INDEPENDENT_AMBULATORY_CARE_PROVIDER_SITE_OTHER): Payer: Medicare Other

## 2023-01-02 DIAGNOSIS — E7800 Pure hypercholesterolemia, unspecified: Secondary | ICD-10-CM | POA: Diagnosis not present

## 2023-01-02 DIAGNOSIS — I10 Essential (primary) hypertension: Secondary | ICD-10-CM

## 2023-01-02 DIAGNOSIS — R739 Hyperglycemia, unspecified: Secondary | ICD-10-CM

## 2023-01-02 LAB — HEPATIC FUNCTION PANEL
ALT: 15 U/L (ref 0–35)
AST: 20 U/L (ref 0–37)
Albumin: 4.4 g/dL (ref 3.5–5.2)
Alkaline Phosphatase: 67 U/L (ref 39–117)
Bilirubin, Direct: 0.1 mg/dL (ref 0.0–0.3)
Total Bilirubin: 0.3 mg/dL (ref 0.2–1.2)
Total Protein: 7 g/dL (ref 6.0–8.3)

## 2023-01-02 LAB — BASIC METABOLIC PANEL
BUN: 47 mg/dL — ABNORMAL HIGH (ref 6–23)
CO2: 23 mEq/L (ref 19–32)
Calcium: 8.8 mg/dL (ref 8.4–10.5)
Chloride: 103 mEq/L (ref 96–112)
Creatinine, Ser: 3.14 mg/dL — ABNORMAL HIGH (ref 0.40–1.20)
GFR: 14.76 mL/min — CL (ref >60.00)
Glucose, Bld: 91 mg/dL (ref 70–99)
Potassium: 5 mEq/L (ref 3.5–5.1)
Sodium: 135 mEq/L (ref 135–145)

## 2023-01-02 LAB — CBC WITH DIFFERENTIAL/PLATELET
Basophils Absolute: 0.1 10*3/uL (ref 0.0–0.1)
Basophils Relative: 0.8 % (ref 0.0–3.0)
Eosinophils Absolute: 0.1 10*3/uL (ref 0.0–0.7)
Eosinophils Relative: 1.6 % (ref 0.0–5.0)
HCT: 32 % — ABNORMAL LOW (ref 36.0–46.0)
Hemoglobin: 10.4 g/dL — ABNORMAL LOW (ref 12.0–15.0)
Lymphocytes Relative: 9.6 % — ABNORMAL LOW (ref 12.0–46.0)
Lymphs Abs: 0.6 10*3/uL — ABNORMAL LOW (ref 0.7–4.0)
MCHC: 32.5 g/dL (ref 30.0–36.0)
MCV: 90.2 fl (ref 78.0–100.0)
Monocytes Absolute: 0.6 10*3/uL (ref 0.1–1.0)
Monocytes Relative: 8.7 % (ref 3.0–12.0)
Neutro Abs: 5.2 10*3/uL (ref 1.4–7.7)
Neutrophils Relative %: 79.3 % — ABNORMAL HIGH (ref 43.0–77.0)
Platelets: 244 10*3/uL (ref 150.0–400.0)
RBC: 3.54 Mil/uL — ABNORMAL LOW (ref 3.87–5.11)
RDW: 27.4 % — ABNORMAL HIGH (ref 11.5–15.5)
WBC: 6.5 10*3/uL (ref 4.0–10.5)

## 2023-01-02 LAB — LIPID PANEL
Cholesterol: 164 mg/dL (ref 0–200)
HDL: 83.7 mg/dL (ref >39.00)
LDL Cholesterol: 68 mg/dL (ref 0–99)
NonHDL: 80.3
Total CHOL/HDL Ratio: 2
Triglycerides: 64 mg/dL (ref 0.0–149.0)
VLDL: 12.8 mg/dL (ref 0.0–40.0)

## 2023-01-02 LAB — TSH: TSH: 178.81 u[IU]/mL — ABNORMAL HIGH (ref 0.35–5.50)

## 2023-01-02 LAB — HEMOGLOBIN A1C: Hgb A1c MFr Bld: 4.8 % (ref 4.6–6.5)

## 2023-01-03 ENCOUNTER — Other Ambulatory Visit: Payer: Self-pay | Admitting: *Deleted

## 2023-01-03 ENCOUNTER — Other Ambulatory Visit: Payer: Self-pay | Admitting: Internal Medicine

## 2023-01-03 ENCOUNTER — Telehealth: Payer: Self-pay

## 2023-01-03 MED ORDER — LEVOTHYROXINE SODIUM 75 MCG PO TABS: 75.0000 ug | ORAL_TABLET | Freq: Every day | ORAL | 2 refills | Status: DC

## 2023-01-03 NOTE — Telephone Encounter (Signed)
Left message to call the office back regarding lab results  

## 2023-01-03 NOTE — Progress Notes (Signed)
Rx sent in for synthroid q day.

## 2023-01-03 NOTE — Telephone Encounter (Signed)
Patient states she is returning our call.  I read Dr. Westley Hummer Scott's message to patient.  Patient states she does have a follow-up appointment scheduled with Dr. Thedore Mins.  Patient states she has been taking her synthroid q day.  Patient states she will increase her synthroid from to q day.  Patient states she understood the instructions I read to her from Dr. Roby Lofts message.

## 2023-01-03 NOTE — Telephone Encounter (Signed)
-----   Message from Dale [place], MD sent at 01/03/2023  3:34 PM EDT ----- Please call and notify - potassium is better.  Kidney function has decreased more.  She is seeing nephrology.  I have messaged Dr Thedore Mins (her nephrologist).  Please confirm she has f/u scheduled with him.  TSH is elevated.  It appears she is not taking her synthroid.  Please confirm if taking.  If taking synthroid q day then need to increase to q day.  Needs to take first thing in am by itself with water and do not take any other medication or eat anything for at least 30 min - 1 hour.  Hgb stable/improved.  Cholesterol ok.  Liver wnl.

## 2023-01-06 NOTE — Progress Notes (Signed)
Date:  01/08/2023   ID:  [name], DOB 06/01/1956, MRN [id number]  Patient Location:  [address] [place] [id number]   Provider location:   [facility], [place] office  PCP:  D[place], MD  Cardiologist:  [facility]   Chief Complaint  Patient presents with   1 month follow up     Patient c/o shortness of breath when climbing stairs. Medications reviewed by the patient verbally.     History of Present Illness:    Dawn Sherman is a 67 y.o. female  past medical history of smoker, COPD, quit smoking 1/24 prior alcohol,  chronic kidney disease,  hypertension,  hospital June 2021 with worsening PND orthopnea, congestive heart failure symptoms, " panic attack" Normal ejection fraction April 2023 EF down to 35% in 2/24 with CHF sx Who presents for follow-up of her diastolic and systolic CHF, PAF  Last seen by myself in clinic 10/08/22 Seen by advanced heart failure clinic December 05, 2022 Torsemide increased to 60 alternating with 40 Continue BiDil, Farxiga, increase Coreg Cardiac MRI ordered, has not been completed On amiodarone to maintain normal sinus rhythm  In follow-up today she reports that she feels well, denies significant shortness of breath on exertion Only gets short of breath climbing stairs  BP well controlled yesterday Elevated today, reports taking her medications " [name] is driving" forced her blood pressure up today  Denies lower extremity edema Denies cough, no PND orthopnea Does not feel that she has fluid in her lungs  On amiodarone 200 daily Eliquis 2.5 twice daily, reduced dose for weight and renal function Taking carvedilol 9.375 twice daily, Farxiga 10 daily BiDil 3 times daily torsemide 60 alternating with 40 Not on spironolactone secondary to history of hyperkalemia  Quit smoking February 2024  EKG personally reviewed by myself on todays visit Normal sinus rhythm rate 70 bpm LVH with strain  pattern  Other past medical history reviewed Echocardiogram April 2023 ejection fraction 65 to 70% Echo July 01, 2022 EF 50 to 55%  hospital admission February 2024  acute on chronic diastolic CHF, respiratory failure, COPD Moderate right pleural effusion on CT Echo ejection fraction 35 to 40% severely dilated left and right atrium, moderate MR Thoracentesis completed Hemoglobin 9.7, down from 11 Went home from hospital 10/01/22   hospital June 2021 Echocardiogram confirming ejection fraction 30%, global Unable to exclude hypertensive cardiomyopath In the setting of paroxysmal atrial fibrillation  pleural effusion on CT scan  Prior records reviewed hyperkalemia with K as high as 6.9 on 08/04/20 and her [place] has had to be discontinued. ACE and ARB avoided   Past Medical History:  Diagnosis Date   B12 deficiency    Cardiomyopathy (HCC)    a. 09/2022 Echo: EF 35-40%; b. 09/2022 MV: apical defect w/ mild peri-infarct ischemia. EF 43%.   Chronic HFrEF (heart failure with reduced ejection fraction) (HCC)    a. 01/2020 Echo: EF 30-35%; b. 07/2020 Echo: EF 50-55%; c. 06/2022 Echo: EF 50-55%; d. 09/2022 Echo: EF 35-40%, globh HK, mod reduced RV fxn, sev BAE, mod MR.   CKD (chronic kidney disease), stage IV (HCC)    COPD (chronic obstructive pulmonary disease) (HCC)    Depression    Endometriosis    Hypertension    Mixed hyperlipidemia    Moderate mitral regurgitation    Normocytic anemia    PAF (paroxysmal atrial fibrillation) (HCC)    a. CHA2DS2VASc =  4-->eliquis 2.5 bid.   Paroxymsal Atrial flutter (HCC)    Tobacco abuse    Past Surgical History:  Procedure Laterality Date   ABDOMINAL HYSTERECTOMY     BREAST BIOPSY Right 2015   neg-  FIBROADENOMA   CENTRAL LINE INSERTION  11/15/2022   Procedure: CENTRAL LINE INSERTION;  Surgeon: Yvonne Kendall, MD;  Location: [facility];  Service: Cardiovascular;;   COLONOSCOPY WITH PROPOFOL N/A 11/24/2022   Procedure:  COLONOSCOPY WITH PROPOFOL;  Surgeon: Regis Bill, MD;  Location: [facility];  Service: Endoscopy;  Laterality: N/A;   COLONOSCOPY WITH PROPOFOL N/A 11/23/2022   Procedure: COLONOSCOPY WITH PROPOFOL;  Surgeon: Regis Bill, MD;  Location: [facility];  Service: Endoscopy;  Laterality: N/A;   ESOPHAGOGASTRODUODENOSCOPY (EGD) WITH PROPOFOL N/A 11/23/2022   Procedure: ESOPHAGOGASTRODUODENOSCOPY (EGD) WITH PROPOFOL;  Surgeon: Regis Bill, MD;  Location: [facility];  Service: Endoscopy;  Laterality: N/A;   RIGHT HEART CATH N/A 11/15/2022   Procedure: RIGHT HEART CATH;  Surgeon: Yvonne Kendall, MD;  Location: [facility];  Service: Cardiovascular;  Laterality: N/A;   RIGHT/LEFT HEART CATH AND CORONARY ANGIOGRAPHY N/A 11/25/2022   Procedure: RIGHT/LEFT HEART CATH AND CORONARY ANGIOGRAPHY;  Surgeon: Iran Ouch, MD;  Location: [facility];  Service: Cardiovascular;  Laterality: N/A;   TAH/RSO  1999   secondary to bleeding and endometriosis (Dr Haskel Khan)      Allergies:   Sulfate, Codeine sulfate, Benicar [olmesartan], Amoxicillin, Clindamycin/lincomycin, Entresto [sacubitril-valsartan], Morphine and codeine, and Penicillins   Social History   Tobacco Use   Smoking status: Former    Packs/day: 0.25    Years: 40.00    Additional pack years: 0.00    Total pack years: 10.00    Types: Cigarettes   Smokeless tobacco: Never   Tobacco comments:    Patient quit smoking recently   Vaping Use   Vaping Use: Never used  Substance Use Topics   Alcohol use: Not Currently    Alcohol/week: 0.0 standard drinks of alcohol    Comment: occasional   Drug use: No     Current Outpatient Medications on File Prior to Visit  Medication Sig Dispense Refill   acetaminophen (TYLENOL) 650 MG CR tablet Take 650 mg by mouth every 8 (eight) hours as needed for pain or fever.     albuterol (VENTOLIN HFA) 108 (90 Base) MCG/ACT inhaler Inhale 2 puffs into the lungs  every 6 (six) hours as needed for wheezing or shortness of breath. 8 g 2   amiodarone (PACERONE) 200 MG tablet Take 1 tablet (200 mg total) by mouth daily. 30 tablet 1   apixaban (ELIQUIS) 2.5 MG TABS tablet Take 1 tablet (2.5 mg total) by mouth 2 (two) times daily. 60 tablet 6   atorvastatin (LIPITOR) 40 MG tablet Take 1 tablet (40 mg total) by mouth daily. 90 tablet 1   buPROPion (WELLBUTRIN XL) 150 MG 24 hr tablet Take 1 tablet (150 mg total) by mouth daily. 90 tablet 1   calcium carbonate (OS-CAL - DOSED IN MG OF ELEMENTAL CALCIUM) 1250 (500 Ca) MG tablet Take 1 tablet (500 mg of elemental calcium total) by mouth 3 (three) times daily with meals. 60 tablet 1   carvedilol (COREG) 6.25 MG tablet Take 1.5 tablets (9.375 mg total) by mouth 2 (two) times daily with a meal. 60 tablet 1   FARXIGA 10 MG TABS tablet Take 10 mg by mouth daily.     Fluticasone-Umeclidin-Vilant (TRELEGY ELLIPTA) 100-62.5-25 MCG/ACT AEPB  Inhale 1 puff into the lungs daily. 3 each 3   isosorbide-hydrALAZINE (BIDIL) 20-37.5 MG tablet Take 1 tablet by mouth 3 (three) times daily. 90 tablet 1   levothyroxine (SYNTHROID) 75 MCG tablet Take 1 tablet (75 mcg total) by mouth daily. 30 tablet 2   loratadine (CLARITIN) 10 MG tablet Take 10 mg by mouth daily.     omeprazole (PRILOSEC) 20 MG capsule Take 20 mg by mouth daily.     torsemide (DEMADEX) 20 MG tablet Take 2 tablets (40 mg) every other day alternating with 60mg  every other day. 270 tablet 3   venlafaxine XR (EFFEXOR-XR) 150 MG 24 hr capsule TAKE ONE CAPSULE BY MOUTH EVERY DAY 90 capsule 1   No current facility-administered medications on file prior to visit.     Family Hx: The patient's family history includes Fibromyalgia in her daughter; Hypercholesterolemia in her mother; Rheum arthritis in her daughter and father. There is no history of Breast cancer or Colon cancer.  ROS:   Please see the history of present illness.    Review of Systems  Constitutional: Negative.    HENT: Negative.    Respiratory: Negative.    Cardiovascular: Negative.   Gastrointestinal: Negative.   Musculoskeletal: Negative.   Neurological: Negative.   Psychiatric/Behavioral: Negative.    All other systems reviewed and are negative.    Labs/Other Tests and Data Reviewed:    Recent Labs: 11/12/2022: B Natriuretic Peptide 3,247.0 11/20/2022: Magnesium 2.1 01/02/2023: ALT 15; BUN 47; Creatinine, Ser 3.14; Hemoglobin 10.4; Platelets 244.0; Potassium 5.0; Sodium 135; TSH 178.81   Recent Lipid Panel Lab Results  Component Value Date/Time   CHOL 164 01/02/2023 07:31 AM   TRIG 64.0 01/02/2023 07:31 AM   HDL 83.70 01/02/2023 07:31 AM   CHOLHDL 2 01/02/2023 07:31 AM   LDLCALC 68 01/02/2023 07:31 AM   LDLDIRECT 53.3 10/14/2012 08:21 AM    Wt Readings from Last 3 Encounters:  01/08/23 117 lb 2 oz (53.1 kg)  01/07/23 117 lb (53.1 kg)  12/16/22 120 lb (54.4 kg)     Exam:    Vital Signs: Vital signs may also be detailed in the HPI BP (!) 150/76 (BP Location: Left Arm, Patient Position: Sitting, Cuff Size: Normal)   Pulse 70   Ht 5\' 5"  (1.651 m)   Wt 117 lb 2 oz (53.1 kg)   SpO2 96%   BMI 19.49 kg/m   Constitutional:  oriented to person, place, and time. No distress.  HENT:  Head: Grossly normal Eyes:  no discharge. No scleral icterus.  Neck: No JVD, no carotid bruits  Cardiovascular: Regular rate and rhythm, no murmurs appreciated Pulmonary/Chest: Clear to auscultation bilaterally, no wheezes or rails Abdominal: Soft.  no distension.  no tenderness.  Musculoskeletal: Normal range of motion Neurological:  normal muscle tone. Coordination normal. No atrophy Skin: Skin warm and dry Psychiatric: normal affect, pleasant  ASSESSMENT & PLAN:    Problem List Items Addressed This Visit       Cardiology Problems   Essential hypertension     Other   Tobacco use   Chronic obstructive pulmonary disease (HCC)   Relevant Orders   EKG 12-Lead   Dyspnea   Relevant Orders    EKG 12-Lead   Other Visit Diagnoses     Chronic systolic heart failure (HCC)    -  Primary   Relevant Orders   EKG 12-Lead   Chronic renal impairment, stage 3b (HCC)       Relevant Orders  EKG 12-Lead   Paroxysmal A-fib (HCC)       Relevant Orders   EKG 12-Lead   Lymphedema         Dilated cardiomyopathy, nonischemic Ejection fraction normalized on echocardiogram April 2023 Drop in ejection fraction noted February 2024 EF 35% Toleratingcarvedilol 9.375 twice daily, Farxiga 10 daily BiDil 3 times daily torsemide 60 alternating with 40 Previously did not tolerate Entresto/ARB, spironolactone in the setting of hyperkalemia Cardiac catheterization with no significant coronary disease Blood pressure elevated today, discussed with her in detail Had improved numbers yesterday with primary care Recommend she monitor blood pressure closely and call us with numbers If blood pressure continues to run high, could increase carvedilol, BiDil  Paroxysmal atrial fibrillation Maintaining normal sinus rhythm, continue carvedilol, continue Eliquis, amiodarone  Chronic kidney disease Stable creatinine >2 Close monitoring on torsemide  Hyperlipidemia Recommend Lipitor daily   Total encounter time more than 40 minutes  Greater than 50% was spent in counseling and coordination of care with the patient    Signed, Julien Nordmann, MD  [facility] [address], [place], [place] [address]

## 2023-01-06 NOTE — Telephone Encounter (Signed)
Noted  

## 2023-01-07 ENCOUNTER — Encounter: Payer: Self-pay | Admitting: Oncology

## 2023-01-07 ENCOUNTER — Encounter: Payer: Self-pay | Admitting: Internal Medicine

## 2023-01-07 ENCOUNTER — Ambulatory Visit (INDEPENDENT_AMBULATORY_CARE_PROVIDER_SITE_OTHER): Payer: Medicare Other | Admitting: Internal Medicine

## 2023-01-07 VITALS — BP 122/70 | HR 62 | Temp 97.9°F | Resp 16 | Ht 65.0 in | Wt 117.0 lb

## 2023-01-07 DIAGNOSIS — E039 Hypothyroidism, unspecified: Secondary | ICD-10-CM | POA: Diagnosis not present

## 2023-01-07 DIAGNOSIS — I480 Paroxysmal atrial fibrillation: Secondary | ICD-10-CM

## 2023-01-07 DIAGNOSIS — D649 Anemia, unspecified: Secondary | ICD-10-CM | POA: Diagnosis not present

## 2023-01-07 DIAGNOSIS — I10 Essential (primary) hypertension: Secondary | ICD-10-CM

## 2023-01-07 DIAGNOSIS — I5020 Unspecified systolic (congestive) heart failure: Secondary | ICD-10-CM | POA: Diagnosis not present

## 2023-01-07 DIAGNOSIS — E7800 Pure hypercholesterolemia, unspecified: Secondary | ICD-10-CM

## 2023-01-07 LAB — T4, FREE: Free T4: 0.5 ng/dL — ABNORMAL LOW (ref 0.60–1.60)

## 2023-01-07 MED ORDER — ATORVASTATIN CALCIUM 40 MG PO TABS: 40.0000 mg | ORAL_TABLET | Freq: Every day | ORAL | 1 refills | Status: DC

## 2023-01-07 MED ORDER — VENLAFAXINE HCL ER 150 MG PO CP24: ORAL_CAPSULE | ORAL | 1 refills | Status: DC

## 2023-01-07 MED ORDER — BUPROPION HCL ER (XL) 150 MG PO TB24: 150.0000 mg | ORAL_TABLET | Freq: Every day | ORAL | 1 refills | Status: DC

## 2023-01-07 NOTE — Patient Instructions (Signed)
Schedule non fasting lab in  4 weeks - recheck tsh

## 2023-01-07 NOTE — Progress Notes (Unsigned)
Subjective:    Patient ID: Dawn Sherman, female    DOB: Apr 02, 1956, 67 y.o.   MRN: [id number]  Patient here for  Chief Complaint  Patient presents with   Medical Management of Chronic Issues    HPI Here for a scheduled follow up.  Previously hospitalized - 11/12/22 - 11/26/22 - admitted with symptomatic anemia.  Presented with increased sob.  LHC - normal coronaries.  She was unable to complete one unit pRBC transfusion due to decompensated heart failure requiring lasix. Repeat echo - EF 25-30% with global hypokinesis.  Recommended continuing carvedilol, farxiga and bidil.  EGD 11/23/22 - negative.  Colonoscopy 11/24/22 - poor prep.  No large lesions.  11/26/22 hgb - 8.7. Also during hospitalization - afib - was started on amiodarone drip and converted to SR. On eliquis.  Discharged on oral amiodarone. Cath 11/2022 - no significant CAD.  Saw cardiology 12/05/22 - recommended continuing farxiga, bidil and coreg was increased to 9.375 mg bid.  Recommended cardiac MRI.  Scheduled. Saw Dr Smith Robert for f/u anemia.  Recommended feraheme.  Also persistent significantly elevated TSH. Discussed with her today.  Reports she is taking daily and taking correctly.  Dose just adjusted.  Reports she is feeling better.  Does report some "sleepy feeling", but no chest pain.  Breathing is better.  No increased cough or congestion.  No abdominal pain or bowel change. No headache or dizziness.     Past Medical History:  Diagnosis Date   B12 deficiency    Cardiomyopathy (HCC)    a. 09/2022 Echo: EF 35-40%; b. 09/2022 MV: apical defect w/ mild peri-infarct ischemia. EF 43%.   Chronic HFrEF (heart failure with reduced ejection fraction) (HCC)    a. 01/2020 Echo: EF 30-35%; b. 07/2020 Echo: EF 50-55%; c. 06/2022 Echo: EF 50-55%; d. 09/2022 Echo: EF 35-40%, globh HK, mod reduced RV fxn, sev BAE, mod MR.   CKD (chronic kidney disease), stage IV (HCC)    COPD (chronic obstructive pulmonary disease) (HCC)    Depression     Endometriosis    Hypertension    Mixed hyperlipidemia    Moderate mitral regurgitation    Normocytic anemia    PAF (paroxysmal atrial fibrillation) (HCC)    a. CHA2DS2VASc = 4-->eliquis 2.5 bid.   Paroxymsal Atrial flutter (HCC)    Tobacco abuse    Past Surgical History:  Procedure Laterality Date   ABDOMINAL HYSTERECTOMY     BREAST BIOPSY Right 2015   neg-  FIBROADENOMA   CENTRAL LINE INSERTION  11/15/2022   Procedure: CENTRAL LINE INSERTION;  Surgeon: Yvonne Kendall, MD;  Location: [facility];  Service: Cardiovascular;;   COLONOSCOPY WITH PROPOFOL N/A 11/24/2022   Procedure: COLONOSCOPY WITH PROPOFOL;  Surgeon: Regis Bill, MD;  Location: [facility];  Service: Endoscopy;  Laterality: N/A;   COLONOSCOPY WITH PROPOFOL N/A 11/23/2022   Procedure: COLONOSCOPY WITH PROPOFOL;  Surgeon: Regis Bill, MD;  Location: [facility];  Service: Endoscopy;  Laterality: N/A;   ESOPHAGOGASTRODUODENOSCOPY (EGD) WITH PROPOFOL N/A 11/23/2022   Procedure: ESOPHAGOGASTRODUODENOSCOPY (EGD) WITH PROPOFOL;  Surgeon: Regis Bill, MD;  Location: [facility];  Service: Endoscopy;  Laterality: N/A;   RIGHT HEART CATH N/A 11/15/2022   Procedure: RIGHT HEART CATH;  Surgeon: Yvonne Kendall, MD;  Location: [facility];  Service: Cardiovascular;  Laterality: N/A;   RIGHT/LEFT HEART CATH AND CORONARY ANGIOGRAPHY N/A 11/25/2022   Procedure: RIGHT/LEFT HEART CATH AND CORONARY ANGIOGRAPHY;  Surgeon: Iran Ouch, MD;  Location: [facility];  Service: Cardiovascular;  Laterality: N/A;   TAH/RSO  1999   secondary to bleeding and endometriosis (Dr Haskel Khan)   Family History  Problem Relation Age of Onset   Hypercholesterolemia Mother    Rheum arthritis Father    Rheum arthritis Daughter    Fibromyalgia Daughter    Breast cancer Neg Hx    Colon cancer Neg Hx    Social History   Socioeconomic History   Marital status: Married    Spouse name: Not on  file   Number of children: Not on file   Years of education: Not on file   Highest education level: Not on file  Occupational History   Not on file  Tobacco Use   Smoking status: Former    Packs/day: 0.25    Years: 40.00    Additional pack years: 0.00    Total pack years: 10.00    Types: Cigarettes   Smokeless tobacco: Never   Tobacco comments:    Patient quit smoking recently   Vaping Use   Vaping Use: Never used  Substance and Sexual Activity   Alcohol use: Not Currently    Alcohol/week: 0.0 standard drinks of alcohol    Comment: occasional   Drug use: No   Sexual activity: Not Currently  Other Topics Concern   Not on file  Social History Narrative   Lives in [place] with her husband.  She is retired from [employer]	[place].  She does not routinely exercise.   Social Determinants of Health   Financial Resource Strain: Low Risk  (12/12/2022)   Overall Financial Resource Strain (CARDIA)    Difficulty of Paying Living Expenses: Not hard at all  Food Insecurity: No Food Insecurity (12/12/2022)   Hunger Vital Sign    Worried About Running Out of Food in the Last Year: Never true    Ran Out of Food in the Last Year: Never true  Transportation Needs: No Transportation Needs (12/12/2022)   PRAPARE - Administrator, Civil Service (Medical): No    Lack of Transportation (Non-Medical): No  Physical Activity: Sufficiently Active (12/12/2022)   Exercise Vital Sign    Days of Exercise per Week: 7 days    Minutes of Exercise per Session: 30 min  Stress: No Stress Concern Present (12/12/2022)   [employer] of Occupational Health - Occupational Stress Questionnaire    Feeling of Stress : Not at all  Social Connections: Unknown (12/12/2022)   Social Connection and Isolation Panel [NHANES]    Frequency of Communication with Friends and Family: Not on file    Frequency of Social Gatherings with Friends and Family: Not on file    Attends Religious Services: Not on  file    Active Member of Clubs or Organizations: Not on file    Attends Banker Meetings: Not on file    Marital Status: Married     Review of Systems  Constitutional:  Negative for appetite change.       Weight is down a few pounds from last visit.   HENT:  Negative for congestion and sinus pain.   Respiratory:  Negative for cough, chest tightness and shortness of breath.   Cardiovascular:  Negative for chest pain and palpitations.  Gastrointestinal:  Negative for abdominal pain, diarrhea, nausea and vomiting.  Genitourinary:  Negative for difficulty urinating and dysuria.  Musculoskeletal:  Negative for joint swelling and myalgias.  Skin:  Negative for color change and rash.  Neurological:  Negative for dizziness and headaches.  Psychiatric/Behavioral:  Negative for agitation and dysphoric mood.        Objective:     BP 122/70   Pulse 62   Temp 97.9 F (36.6 C)   Resp 16   Ht 5\' 5"  (1.651 m)   Wt 117 lb (53.1 kg)   SpO2 97%   BMI 19.47 kg/m  Wt Readings from Last 3 Encounters:  01/07/23 117 lb (53.1 kg)  12/16/22 120 lb (54.4 kg)  12/12/22 125 lb (56.7 kg)    Physical Exam Vitals reviewed.  Constitutional:      General: She is not in acute distress.    Appearance: Normal appearance.  HENT:     Head: Normocephalic and atraumatic.     Right Ear: External ear normal.     Left Ear: External ear normal.  Eyes:     General: No scleral icterus.       Right eye: No discharge.        Left eye: No discharge.     Conjunctiva/sclera: Conjunctivae normal.  Neck:     Thyroid: No thyromegaly.  Cardiovascular:     Rate and Rhythm: Normal rate and regular rhythm.  Pulmonary:     Effort: No respiratory distress.     Breath sounds: Normal breath sounds. No wheezing.  Abdominal:     General: Bowel sounds are normal.     Palpations: Abdomen is soft.     Tenderness: There is no abdominal tenderness.  Musculoskeletal:        General: No swelling or  tenderness.     Cervical back: Neck supple. No tenderness.  Lymphadenopathy:     Cervical: No cervical adenopathy.  Skin:    Findings: No erythema or rash.  Neurological:     Mental Status: She is alert.  Psychiatric:        Mood and Affect: Mood normal.        Behavior: Behavior normal.      Outpatient Encounter Medications as of 01/07/2023  Medication Sig   acetaminophen (TYLENOL) 650 MG CR tablet Take 650 mg by mouth every 8 (eight) hours as needed for pain or fever.   albuterol (VENTOLIN HFA) 108 (90 Base) MCG/ACT inhaler Inhale 2 puffs into the lungs every 6 (six) hours as needed for wheezing or shortness of breath. (Patient not taking: Reported on 12/31/2022)   amiodarone (PACERONE) 200 MG tablet Take 1 tablet (200 mg total) by mouth daily.   apixaban (ELIQUIS) 2.5 MG TABS tablet Take 1 tablet (2.5 mg total) by mouth 2 (two) times daily.   atorvastatin (LIPITOR) 40 MG tablet Take 1 tablet (40 mg total) by mouth daily.   buPROPion (WELLBUTRIN XL) 150 MG 24 hr tablet Take 1 tablet (150 mg total) by mouth daily.   calcium carbonate (OS-CAL - DOSED IN MG OF ELEMENTAL CALCIUM) 1250 (500 Ca) MG tablet Take 1 tablet (500 mg of elemental calcium total) by mouth 3 (three) times daily with meals.   carvedilol (COREG) 6.25 MG tablet Take 1.5 tablets (9.375 mg total) by mouth 2 (two) times daily with a meal.   FARXIGA 10 MG TABS tablet Take 10 mg by mouth daily.   Fluticasone-Umeclidin-Vilant (TRELEGY ELLIPTA) 100-62.5-25 MCG/ACT AEPB Inhale 1 puff into the lungs daily.   isosorbide-hydrALAZINE (BIDIL) 20-37.5 MG tablet Take 1 tablet by mouth 3 (three) times daily.   levothyroxine (SYNTHROID) 75 MCG tablet Take 1 tablet (75 mcg total) by mouth daily.   loratadine (  CLARITIN) 10 MG tablet Take 10 mg by mouth daily.   omeprazole (PRILOSEC) 20 MG capsule Take 20 mg by mouth daily.   torsemide (DEMADEX) 20 MG tablet Take 2 tablets (40 mg) every other day alternating with 60mg  every other day.    venlafaxine XR (EFFEXOR-XR) 150 MG 24 hr capsule TAKE ONE CAPSULE BY MOUTH EVERY DAY   [DISCONTINUED] atorvastatin (LIPITOR) 40 MG tablet Take 1 tablet (40 mg total) by mouth daily.   [DISCONTINUED] buPROPion (WELLBUTRIN XL) 150 MG 24 hr tablet Take 1 tablet (150 mg total) by mouth daily.   [DISCONTINUED] venlafaxine XR (EFFEXOR-XR) 150 MG 24 hr capsule TAKE ONE CAPSULE BY MOUTH EVERY DAY   No facility-administered encounter medications on file as of 01/07/2023.     Lab Results  Component Value Date   WBC 6.5 01/02/2023   HGB 10.4 (L) 01/02/2023   HCT 32.0 (L) 01/02/2023   PLT 244.0 01/02/2023   GLUCOSE 91 01/02/2023   CHOL 164 01/02/2023   TRIG 64.0 01/02/2023   HDL 83.70 01/02/2023   LDLDIRECT 53.3 10/14/2012   LDLCALC 68 01/02/2023   ALT 15 01/02/2023   AST 20 01/02/2023   NA 135 01/02/2023   K 5.0 01/02/2023   CL 103 01/02/2023   CREATININE 3.14 (H) 01/02/2023   BUN 47 (H) 01/02/2023   CO2 23 01/02/2023   TSH 178.81 (H) 01/02/2023   INR 1.2 11/21/2022   HGBA1C 4.8 01/02/2023    DG Chest Port 1 View  Result Date: 11/15/2022 CLINICAL DATA:  Central venous catheter in place. EXAM: PORTABLE CHEST 1 VIEW COMPARISON:  Chest radiograph 11/12/2022 and earlier FINDINGS: Of note, the patient is mildly rotated on this portable examination. Interval placement of right IJ central venous catheter with the tip projecting at the level of the superior cavoatrial junction. Stable enlarged cardiac silhouette. New moderate layering right pleural effusion. No left pleural effusion. Mild diffuse hazy interstitial thickening. No pneumothorax. Old bilateral rib fractures. No acute osseous abnormality. Rightward curvature of the distal thoracic spine. IMPRESSION: 1. Interval placement of right IJ central venous catheter with the tip projecting at the level of the superior cavoatrial junction. 2. Moderate layering right pleural effusion, increased compared to most recent exam. No left pleural effusion.  3. Mild pulmonary edema. Electronically Signed   By: Sherron Ales M.D.   On: 11/15/2022 14:14   CARDIAC CATHETERIZATION  Result Date: 11/15/2022 Conclusions: Severely elevated left heart, right heart, and pulmonary artery pressure. Mildly reduced Fick cardiac output/index.  Cardiac output may be overestimated in the setting of significant anemia. Successful placement of a 61F triple-lumen central venous catheter via the right internal jugular vein. Recommendations: Transfer to stepdown. Initiate milrinone 0.25 mcg/kg/min, with further titration based on blood pressure, urine output, and cooximetry. Hold metoprolol in the setting of low cardiac output requiring inotropic therapy. Resume diuresis. Obtain portable chest radiograph when patient reaches ICU to confirm central line placement. Yvonne Kendall, MD [facility]  ECHOCARDIOGRAM LIMITED  Result Date: 11/14/2022    ECHOCARDIOGRAM LIMITED REPORT   Patient Name:   ILANY TARDIE Date of Exam: 11/14/2022 Medical Rec #:  [id number]     Height:       65.0 in Accession #:    [id number]    Weight:       122.4 lb Date of Birth:  [birth year]    BSA:          1.605 m Patient Age:    63 years  BP:           137/77 mmHg Patient Gender: F             HR:           74 bpm. Exam Location:  [facility] Procedure: 2D Echo, Cardiac Doppler and Color Doppler Indications:     Cardiomyopathy -unspecified I42.9  History:         Patient has prior history of Echocardiogram examinations, most                  recent 09/28/2022.  Sonographer:     Cristela Blue Referring Phys:  [id number] Antonieta Iba Diagnosing Phys: Julien Nordmann MD  Sonographer Comments: Image quality was good. IMPRESSIONS  1. Left ventricular ejection fraction, by estimation, is 25 to 30%. The left ventricle has severely decreased function. The left ventricle demonstrates global hypokinesis. There is moderate left ventricular hypertrophy. Left ventricular diastolic parameters are indeterminate.  2. Right ventricular  systolic function is moderately reduced. The right ventricular size is normal. There is moderately elevated pulmonary artery systolic pressure. The estimated right ventricular systolic pressure is 45.7 mmHg.  3. Left atrial size was severely dilated.  4. A small pericardial effusion is present.  5. The mitral valve is normal in structure. Severe mitral valve regurgitation. No evidence of mitral stenosis.  6. Tricuspid valve regurgitation is moderate to severe.  7. The aortic valve is normal in structure. Aortic valve regurgitation is not visualized. No aortic stenosis is present.  8. The inferior vena cava is normal in size with greater than 50% respiratory variability, suggesting right atrial pressure of 3 mmHg. FINDINGS  Left Ventricle: Left ventricular ejection fraction, by estimation, is 25 to 30%. The left ventricle has severely decreased function. The left ventricle demonstrates global hypokinesis. The left ventricular internal cavity size was normal in size. There is moderate left ventricular hypertrophy. Left ventricular diastolic parameters are indeterminate. Right Ventricle: The right ventricular size is normal. No increase in right ventricular wall thickness. Right ventricular systolic function is moderately reduced. There is moderately elevated pulmonary artery systolic pressure. The tricuspid regurgitant velocity is 3.19 m/s, and with an assumed right atrial pressure of 5 mmHg, the estimated right ventricular systolic pressure is 45.7 mmHg. Left Atrium: Left atrial size was severely dilated. Right Atrium: Right atrial size was normal in size. Pericardium: A small pericardial effusion is present. Mitral Valve: The mitral valve is normal in structure. There is mild calcification of the mitral valve leaflet(s). Mild mitral annular calcification. Severe mitral valve regurgitation. No evidence of mitral valve stenosis. Tricuspid Valve: The tricuspid valve is normal in structure. Tricuspid valve regurgitation  is moderate to severe. No evidence of tricuspid stenosis. Aortic Valve: The aortic valve is normal in structure. Aortic valve regurgitation is not visualized. No aortic stenosis is present. Pulmonic Valve: The pulmonic valve was normal in structure. Pulmonic valve regurgitation is mild. No evidence of pulmonic stenosis. Aorta: The aortic root is normal in size and structure. Venous: The inferior vena cava is normal in size with greater than 50% respiratory variability, suggesting right atrial pressure of 3 mmHg. IAS/Shunts: No atrial level shunt detected by color flow Doppler. Additional Comments: Spectral Doppler performed. Color Doppler performed.  LEFT VENTRICLE PLAX 2D LVIDd:         5.80 cm LVIDs:         4.70 cm LV PW:         1.20 cm LV IVS:  1.20 cm  LV Volumes (MOD) LV vol d, MOD A2C: 159.0 ml LV vol d, MOD A4C: 127.0 ml LV vol s, MOD A2C: 119.0 ml LV vol s, MOD A4C: 107.0 ml LV SV MOD A2C:     40.0 ml LV SV MOD A4C:     127.0 ml LV SV MOD BP:      35.0 ml RIGHT VENTRICLE RV Basal diam:  3.00 cm RV Mid diam:    2.30 cm LEFT ATRIUM            Index LA Vol (A2C): 130.0 ml 80.98 ml/m LA Vol (A4C): 57.0 ml  35.51 ml/m  TRICUSPID VALVE TR Peak grad:   40.7 mmHg TR Vmax:        319.00 cm/s Julien Nordmann MD Electronically signed by Julien Nordmann MD Signature Date/Time: 11/14/2022/5:02:08 PM    Final (Updated)        Assessment & Plan:  Hypothyroidism, unspecified type Assessment & Plan: Significantly elevated tsh recently.  Reports has been taking synthroid daily and correctly.  Just increased dose.  Check Free T4 today.  On amiodarone.  Further management pending results of lab.  Continue daily synthroid.   Orders: -     T4, free  Anemia, unspecified type Assessment & Plan: Hospitalized - 11/12/22 - 11/26/22 - admitted with symptomatic anemia.  Presented with increased sob.  LHC - normal coronaries.  She was unable to complete one unit pRBC transfusion due to decompensated heart failure  requiring lasix. EGD 11/23/22 - negative.  Colonoscopy 11/24/22 - poor prep.  No large lesions.  11/26/22 hgb - 8.7.  Saw Dr Smith Robert for f/u anemia.  Recommended feraheme.   HFrEF (heart failure with reduced ejection fraction) (HCC) Assessment & Plan: LHC - normal coronaries.  She was unable to complete one unit pRBC transfusion due to decompensated heart failure requiring lasix. Repeat echo - EF 25-30% with global hypokinesis.  Recommended continuing carvedilol, farxiga and bidil. Cath 11/2022 - no significant CAD.  Saw cardiology 12/05/22 - recommended continuing farxiga, bidil and coreg was increased to 9.375 mg bid.  Recommended cardiac MRI.  Scheduled. Breathing improved. Denies increased sob.  Weight down.     Hypercholesterolemia Assessment & Plan: Continue lipitor.  Low cholesterol diet and exercise.  Follow lipid panel and liver function tests.     Primary hypertension Assessment & Plan: Continue farxiga and bildil.  Also on coreg - recent increased dose. Follow pressures.  Follow metabolic panel.    Paroxysmal atrial fibrillation [facility]) Assessment & Plan: On amiodarone.  Continue eliquis.  Appears to be in SR today.  Follow.    Other orders -     Atorvastatin Calcium; Take 1 tablet (40 mg total) by mouth daily.  Dispense: 90 tablet; Refill: 1 -     buPROPion HCl ER (XL); Take 1 tablet (150 mg total) by mouth daily.  Dispense: 90 tablet; Refill: 1 -     Venlafaxine HCl ER; TAKE ONE CAPSULE BY MOUTH EVERY DAY  Dispense: 90 capsule; Refill: 1     Dale [place], MD

## 2023-01-08 ENCOUNTER — Encounter: Payer: Self-pay | Admitting: Cardiovascular Disease

## 2023-01-08 ENCOUNTER — Ambulatory Visit: Payer: Medicare Other | Attending: Cardiovascular Disease | Admitting: Cardiovascular Disease

## 2023-01-08 VITALS — BP 140/80 | HR 70 | Ht 65.0 in | Wt 117.1 lb

## 2023-01-08 DIAGNOSIS — J432 Centrilobular emphysema: Secondary | ICD-10-CM | POA: Diagnosis present

## 2023-01-08 DIAGNOSIS — I10 Essential (primary) hypertension: Secondary | ICD-10-CM | POA: Diagnosis present

## 2023-01-08 DIAGNOSIS — I5022 Chronic systolic (congestive) heart failure: Secondary | ICD-10-CM

## 2023-01-08 DIAGNOSIS — I480 Paroxysmal atrial fibrillation: Secondary | ICD-10-CM | POA: Diagnosis present

## 2023-01-08 DIAGNOSIS — N2889 Other specified disorders of kidney and ureter: Secondary | ICD-10-CM

## 2023-01-08 DIAGNOSIS — R0609 Other forms of dyspnea: Secondary | ICD-10-CM | POA: Diagnosis present

## 2023-01-08 DIAGNOSIS — I890 Lymphedema, not elsewhere classified: Secondary | ICD-10-CM | POA: Diagnosis present

## 2023-01-08 DIAGNOSIS — N1832 Chronic kidney disease, stage 3b: Secondary | ICD-10-CM

## 2023-01-08 DIAGNOSIS — Z720 Tobacco use: Secondary | ICD-10-CM

## 2023-01-08 MED ORDER — APIXABAN 2.5 MG PO TABS: 2.5000 mg | ORAL_TABLET | Freq: Two times a day (BID) | ORAL | 6 refills | Status: DC

## 2023-01-08 NOTE — Assessment & Plan Note (Signed)
Hospitalized - 11/12/22 - 11/26/22 - admitted with symptomatic anemia.  Presented with increased sob.  LHC - normal coronaries.  She was unable to complete one unit pRBC transfusion due to decompensated heart failure requiring lasix. EGD 11/23/22 - negative.  Colonoscopy 11/24/22 - poor prep.  No large lesions.  11/26/22 hgb - 8.7.  Saw Dr Smith Robert for f/u anemia.  Recommended feraheme.

## 2023-01-08 NOTE — Assessment & Plan Note (Signed)
Continue farxiga and bildil.  Also on coreg - recent increased dose. Follow pressures.  Follow metabolic panel.

## 2023-01-08 NOTE — Assessment & Plan Note (Signed)
Significantly elevated tsh recently.  Reports has been taking synthroid daily and correctly.  Just increased dose.  Check Free T4 today.  On amiodarone.  Further management pending results of lab.  Continue daily synthroid.

## 2023-01-08 NOTE — Assessment & Plan Note (Signed)
Continue lipitor.  Low cholesterol diet and exercise.  Follow lipid panel and liver function tests.   

## 2023-01-08 NOTE — Assessment & Plan Note (Addendum)
LHC - normal coronaries.  She was unable to complete one unit pRBC transfusion due to decompensated heart failure requiring lasix. Repeat echo - EF 25-30% with global hypokinesis.  Recommended continuing carvedilol, farxiga and bidil. Cath 11/2022 - no significant CAD.  Saw cardiology 12/05/22 - recommended continuing farxiga, bidil and coreg was increased to 9.375 mg bid.  Recommended cardiac MRI.  Scheduled. Breathing improved. Denies increased sob.  Weight down.

## 2023-01-08 NOTE — Patient Instructions (Addendum)
We will move CHF appt down 3 weeks  Monitor blood pressure, call if elevated >130  Medication Instructions:  No changes  If you need a refill on your cardiac medications before your next appointment, please call your pharmacy.   Lab work: No new labs needed  Testing/Procedures: No new testing needed  Follow-Up: At [facility] you and your health needs are our priority.  As part of our continuing mission to provide you with exceptional heart care, we have created designated Provider Care Teams.  These Care Teams include your primary Cardiologist (physician) and Advanced Practice Providers (APPs -  Physician Assistants and Nurse Practitioners) who all work together to provide you with the care you need, when you need it.  You will need a follow up appointment in 6 months  Providers on your designated Care Team:   Nicolasa Ducking, NP Eula Listen, PA-C Cadence Fransico Michael, [place]  COVID-19 Vaccine Information can be found at: [email or web address] For questions related to vaccine distribution or appointments, please email [email or web address] or call [phone].

## 2023-01-08 NOTE — Assessment & Plan Note (Signed)
On amiodarone.  Continue eliquis.  Appears to be in SR today.  Follow.  

## 2023-01-09 ENCOUNTER — Other Ambulatory Visit: Payer: Self-pay

## 2023-01-09 ENCOUNTER — Encounter: Payer: Medicare Other | Admitting: Internal Medicine

## 2023-01-09 DIAGNOSIS — E039 Hypothyroidism, unspecified: Secondary | ICD-10-CM

## 2023-01-09 MED ORDER — LEVOTHYROXINE SODIUM 100 MCG PO TABS: 100.0000 ug | ORAL_TABLET | Freq: Every day | ORAL | 0 refills | Status: DC

## 2023-01-14 ENCOUNTER — Ambulatory Visit: Payer: Self-pay

## 2023-01-14 NOTE — Patient Outreach (Signed)
  Care Coordination   Follow Up Visit Note   01/14/2023 Name: Dawn Sherman MRN: [id number] DOB: 1956-08-07  Dawn Sherman is a 67 y.o. year old female who sees Dale Ridgeway, MD for primary care. I spoke with  Dawn Sherman by phone today.  What matters to the patients health and wellness today?  Patient states she is doing fine today.  RNCM noted shortness of breath from patient while talking on the phone.  Patient states she was not SOB anymore than normal and states this was baseline for her.  Patient states she continues to wear her Oxygen at 3L at HS.  Patient reports today's weight was 114 lbs. She states her blood pressure are doing well with readings of 120/76, 125/78, 122/68. Denies any blood pressure since cardiology visit in the 140's/80's.  She states she contributes elevated blood pressure at provider visit to " white coat syndrome."   Patient reports she is scheduled for cardiology follow up visit on 02/04/23 and to have a Cardiac MRI on 02/17/23.   Patient reports her TSH lab was elevated recently. She states her PCP increased her Synthroid to 100 mcg / day.  She reports compliance with medications.    Goals Addressed             This Visit's Progress    Continued improvement post hospitalization and management/ education of health conditions.       Interventions Today    Flowsheet Row Most Recent Value  Chronic Disease   Chronic disease during today's visit Congestive Heart Failure (CHF), Hypertension (HTN), Other  [Anemia, hypothyroidism]  General Interventions   General Interventions Discussed/Reviewed Labs, Doctor Visits  [evaluation of current treatment plan for HF, HTN, anemia, hypothryoidism and patients adherence to plan as established by provider.  Assessed for BP readings and current weight.]  Labs --  [Reviewed Hgb and TSH results from 01/02/23. Confirmed with patient that she has repeat labs scheduled for 01/27/23]  Doctor Visits Discussed/Reviewed --  Dawn Sherman  upcoming provider visits. Confirmed patient has transportation for all upcoming appointments.]  Education Interventions   Education Provided Provided Education  Provided Verbal Education On Other  [Advised to continue to monitor for heart failure symptoms/ weight and check blood pressure daily.  Monitor oxygen saturation and use O2 nightly as recommended. Advised to notify provider/'s for worsening symptoms. Call 911 for severe symptoms.]  Pharmacy Interventions   Pharmacy Dicussed/Reviewed Pharmacy Topics Reviewed  [medications reviewed.  Discussed medication adjustment. Confirmed patient taking synthroid as prescribed.]              SDOH assessments and interventions completed:  No     Care Coordination Interventions:  Yes, provided   Follow up plan: Follow up call scheduled for 02/18/23    Encounter Outcome:  Pt. Visit Completed   Dawn Ina RN,BSN,CCM [facility] Care Coordination [phone] direct line

## 2023-01-14 NOTE — Patient Instructions (Signed)
Visit Information  Thank you for taking time to visit with me today. Please don't hesitate to contact me if I can be of assistance to you.   Following are the goals we discussed today:  Continue to monitor blood pressures daily/ record and send results to cardiologist Monitor weight and oxygen levels daily and record. Keep follow up appointments with providers Take medications as prescribed.   Our next appointment is by telephone on 02/18/23 at 10 am  Please call the care guide team at [phone] if you need to cancel or reschedule your appointment.   If you are experiencing a Mental Health or Behavioral Health Crisis or need someone to talk to, please call the Suicide and Crisis Lifeline: 988 call 1-800-273-TALK (toll free, 24 hour hotline)  Patient verbalizes understanding of instructions and care plan provided today and agrees to view in MyChart. Active MyChart status and patient understanding of how to access instructions and care plan via MyChart confirmed with patient.     [name] RN,BSN,CCM [facility] Care Coordination [phone] direct line

## 2023-01-22 ENCOUNTER — Encounter: Payer: Self-pay | Admitting: Oncology

## 2023-01-24 ENCOUNTER — Other Ambulatory Visit: Payer: Self-pay

## 2023-01-27 ENCOUNTER — Other Ambulatory Visit: Payer: Self-pay | Admitting: *Deleted

## 2023-01-27 ENCOUNTER — Inpatient Hospital Stay: Payer: Medicare Other | Attending: Oncology

## 2023-01-27 DIAGNOSIS — I130 Hypertensive heart and chronic kidney disease with heart failure and stage 1 through stage 4 chronic kidney disease, or unspecified chronic kidney disease: Secondary | ICD-10-CM | POA: Insufficient documentation

## 2023-01-27 DIAGNOSIS — Z7901 Long term (current) use of anticoagulants: Secondary | ICD-10-CM | POA: Diagnosis not present

## 2023-01-27 DIAGNOSIS — N184 Chronic kidney disease, stage 4 (severe): Secondary | ICD-10-CM | POA: Diagnosis not present

## 2023-01-27 DIAGNOSIS — D631 Anemia in chronic kidney disease: Secondary | ICD-10-CM | POA: Diagnosis not present

## 2023-01-27 DIAGNOSIS — N1832 Chronic kidney disease, stage 3b: Secondary | ICD-10-CM

## 2023-01-27 DIAGNOSIS — D693 Immune thrombocytopenic purpura: Secondary | ICD-10-CM

## 2023-01-27 DIAGNOSIS — E039 Hypothyroidism, unspecified: Secondary | ICD-10-CM | POA: Insufficient documentation

## 2023-01-27 DIAGNOSIS — Z87891 Personal history of nicotine dependence: Secondary | ICD-10-CM | POA: Diagnosis not present

## 2023-01-27 DIAGNOSIS — D649 Anemia, unspecified: Secondary | ICD-10-CM

## 2023-01-27 DIAGNOSIS — Z79899 Other long term (current) drug therapy: Secondary | ICD-10-CM | POA: Insufficient documentation

## 2023-01-27 DIAGNOSIS — D509 Iron deficiency anemia, unspecified: Secondary | ICD-10-CM | POA: Insufficient documentation

## 2023-01-27 DIAGNOSIS — N170 Acute kidney failure with tubular necrosis: Secondary | ICD-10-CM

## 2023-01-27 DIAGNOSIS — E538 Deficiency of other specified B group vitamins: Secondary | ICD-10-CM

## 2023-01-27 LAB — CBC
HCT: 41 % (ref 36.0–46.0)
Hemoglobin: 12.8 g/dL (ref 12.0–15.0)
MCH: 29.5 pg (ref 26.0–34.0)
MCHC: 31.2 g/dL (ref 30.0–36.0)
MCV: 94.5 fL (ref 80.0–100.0)
Platelets: 209 10*3/uL (ref 150–400)
RBC: 4.34 MIL/uL (ref 3.87–5.11)
RDW: 20.5 % — ABNORMAL HIGH (ref 11.5–15.5)
WBC: 6 10*3/uL (ref 4.0–10.5)
nRBC: 0 % (ref 0.0–0.2)

## 2023-01-27 LAB — IRON AND TIBC
Iron: 100 ug/dL (ref 28–170)
Saturation Ratios: 31 % (ref 10.4–31.8)
TIBC: 325 ug/dL (ref 250–450)
UIBC: 225 ug/dL

## 2023-01-27 LAB — FERRITIN: Ferritin: 170 ng/mL (ref 11–307)

## 2023-01-27 LAB — LACTATE DEHYDROGENASE: LDH: 171 U/L (ref 98–192)

## 2023-01-27 LAB — TSH: TSH: 94.892 u[IU]/mL — ABNORMAL HIGH (ref 0.350–4.500)

## 2023-01-28 ENCOUNTER — Inpatient Hospital Stay: Payer: Medicare Other

## 2023-01-28 ENCOUNTER — Other Ambulatory Visit: Payer: Self-pay

## 2023-01-28 DIAGNOSIS — E538 Deficiency of other specified B group vitamins: Secondary | ICD-10-CM

## 2023-01-28 DIAGNOSIS — N1832 Chronic kidney disease, stage 3b: Secondary | ICD-10-CM

## 2023-01-28 DIAGNOSIS — D509 Iron deficiency anemia, unspecified: Secondary | ICD-10-CM | POA: Diagnosis not present

## 2023-01-28 DIAGNOSIS — D693 Immune thrombocytopenic purpura: Secondary | ICD-10-CM

## 2023-01-28 DIAGNOSIS — D649 Anemia, unspecified: Secondary | ICD-10-CM

## 2023-01-28 LAB — HAPTOGLOBIN: Haptoglobin: 175 mg/dL (ref 37–355)

## 2023-01-28 MED ORDER — LEVOTHYROXINE SODIUM 125 MCG PO TABS: 125.0000 ug | ORAL_TABLET | Freq: Every day | ORAL | 0 refills | Status: DC

## 2023-01-29 ENCOUNTER — Encounter: Payer: Medicare Other | Admitting: Internal Medicine

## 2023-01-29 LAB — KAPPA/LAMBDA LIGHT CHAINS
Kappa free light chain: 30.2 mg/L — ABNORMAL HIGH (ref 3.3–19.4)
Kappa, lambda light chain ratio: 0.86 (ref 0.26–1.65)
Lambda free light chains: 35 mg/L — ABNORMAL HIGH (ref 5.7–26.3)

## 2023-02-03 ENCOUNTER — Encounter: Payer: Self-pay | Admitting: Oncology

## 2023-02-03 ENCOUNTER — Inpatient Hospital Stay (HOSPITAL_BASED_OUTPATIENT_CLINIC_OR_DEPARTMENT_OTHER): Payer: Medicare Other | Admitting: Oncology

## 2023-02-03 VITALS — BP 142/67 | HR 63 | Temp 97.6°F | Resp 20 | Wt 118.6 lb

## 2023-02-03 DIAGNOSIS — N184 Chronic kidney disease, stage 4 (severe): Secondary | ICD-10-CM | POA: Diagnosis not present

## 2023-02-03 DIAGNOSIS — D509 Iron deficiency anemia, unspecified: Secondary | ICD-10-CM | POA: Diagnosis not present

## 2023-02-03 DIAGNOSIS — D631 Anemia in chronic kidney disease: Secondary | ICD-10-CM

## 2023-02-03 LAB — MULTIPLE MYELOMA PANEL, SERUM
Albumin SerPl Elph-Mcnc: 3.9 g/dL (ref 2.9–4.4)
Albumin/Glob SerPl: 1.7 (ref 0.7–1.7)
Alpha 1: 0.2 g/dL (ref 0.0–0.4)
Alpha2 Glob SerPl Elph-Mcnc: 0.8 g/dL (ref 0.4–1.0)
B-Globulin SerPl Elph-Mcnc: 0.7 g/dL (ref 0.7–1.3)
Gamma Glob SerPl Elph-Mcnc: 0.6 g/dL (ref 0.4–1.8)
Globulin, Total: 2.3 g/dL (ref 2.2–3.9)
IgA: 125 mg/dL (ref 87–352)
IgG (Immunoglobin G), Serum: 628 mg/dL (ref 586–1602)
IgM (Immunoglobulin M), Srm: 43 mg/dL (ref 26–217)
Total Protein ELP: 6.2 g/dL (ref 6.0–8.5)

## 2023-02-03 NOTE — Progress Notes (Signed)
Hematology/Oncology Consult note [facility]  Telephone:([phone] [phone]  Patient Care Team: Dale Montura, MD as PCP - General (Internal Medicine) Antonieta Iba, MD as PCP - Cardiology (Cardiology) Lemar Livings Merrily Pew, MD as Consulting Physician (General Surgery)   Name of the patient: Dawn Sherman  [id number]  03/19/1956   Date of visit: 02/03/23  Diagnosis-anemia of chronic kidney disease and component of iron deficiency History of ITP  Chief complaint/ Reason for visit-routine follow-up of anemia  Heme/Onc history:  Patient is a 67 year old female with a past medical history significant for hypertension, A-fib on Eliquis, CKD who was admitted to the ER after she had a fall.  Patient experienced some generalized weakness and dizziness prior to the fall and felt like she blacked out and fell to the floor hitting the left side of her head and face.  She had a CT head without contrast which showed acute bilateral subarachnoid hemorrhages predominantly along the left sylvian fissure and multiple small foci in the left frontal lobe.  Linear focus of subarachnoid hemorrhage in the right temporal lobe and right frontal lobe.  No significant edema or midline shift.  Subsequent MRI angio head without contrast showed normal intracranial MRA.  Patient was seen by neurosurgery and no surgical intervention is recommended.Platelet counts dropped down to 28 in April 2023 during that hospitalization. Smear review and LDH not indicated of TTP.  No evidence of DIC.  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  HIV hepatitis B hepatitis C and stool H. 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   Patient has history of heart failure with an EF of 25 to 30% with global hypokinesis.  She follows up with cardiology for  the same.  She also has CKD stage IV.  She is on low-dose Eliquis presently for her paroxysmal A-fib.   Patient last seen by me in May 2023 and now referred for anemia.CBC from April 2024 showed H&H of 9.1/28.5 with an MCV of 85.  Ferritin levels were normal at 112 with an iron saturation of 11%.  B12 levels were normal at 520.  TSH elevated during hospitalization at 165.  BMP shows elevated creatinine of 2.8.  Patient received IV iron in April 2024.  Interval history-she is doing well presently and denies any specific complaints at this time.  Did not improve any significant difference in her fatigue despite IV iron  ECOG PS- 1 Pain scale- 0   Review of systems- Review of Systems  Constitutional:  Negative for chills, fever, malaise/fatigue and weight loss.  HENT:  Negative for congestion, ear discharge and nosebleeds.   Eyes:  Negative for blurred vision.  Respiratory:  Negative for cough, hemoptysis, sputum production, shortness of breath and wheezing.   Cardiovascular:  Negative for chest pain, palpitations, orthopnea and claudication.  Gastrointestinal:  Negative for abdominal pain, blood in stool, constipation, diarrhea, heartburn, melena, nausea and vomiting.  Genitourinary:  Negative for dysuria, flank pain, frequency, hematuria and urgency.  Musculoskeletal:  Negative for back pain, joint pain and myalgias.  Skin:  Negative for rash.  Neurological:  Negative for dizziness, tingling, focal weakness, seizures, weakness and headaches.  Endo/Heme/Allergies:  Does not bruise/bleed easily.  Psychiatric/Behavioral:  Negative for depression and suicidal ideas. The patient does not have insomnia.       Allergies  Allergen  Reactions   Sulfate Rash   Codeine Sulfate Nausea Only   Benicar [Olmesartan]     Talked with patient February 10, 2020, intolerance is unclear, tried several medications around that time and one of them gave her a rash but she is not clear which 1.   Amoxicillin Rash    Clindamycin/Lincomycin Rash   Entresto [Sacubitril-Valsartan] Other (See Comments)    hyperkalemia   Morphine And Codeine Rash   Penicillins Rash     Past Medical History:  Diagnosis Date   B12 deficiency    Cardiomyopathy (HCC)    a. 09/2022 Echo: EF 35-40%; b. 09/2022 MV: apical defect w/ mild peri-infarct ischemia. EF 43%.   Chronic HFrEF (heart failure with reduced ejection fraction) (HCC)    a. 01/2020 Echo: EF 30-35%; b. 07/2020 Echo: EF 50-55%; c. 06/2022 Echo: EF 50-55%; d. 09/2022 Echo: EF 35-40%, globh HK, mod reduced RV fxn, sev BAE, mod MR.   CKD (chronic kidney disease), stage IV (HCC)    COPD (chronic obstructive pulmonary disease) (HCC)    Depression    Endometriosis    Hypertension    Mixed hyperlipidemia    Moderate mitral regurgitation    Normocytic anemia    PAF (paroxysmal atrial fibrillation) (HCC)    a. CHA2DS2VASc = 4-->eliquis 2.5 bid.   Paroxymsal Atrial flutter (HCC)    Tobacco abuse      Past Surgical History:  Procedure Laterality Date   ABDOMINAL HYSTERECTOMY     BREAST BIOPSY Right 2015   neg-  FIBROADENOMA   CENTRAL LINE INSERTION  11/15/2022   Procedure: CENTRAL LINE INSERTION;  Surgeon: Yvonne Kendall, MD;  Location: [facility];  Service: Cardiovascular;;   COLONOSCOPY WITH PROPOFOL N/A 11/24/2022   Procedure: COLONOSCOPY WITH PROPOFOL;  Surgeon: Regis Bill, MD;  Location: [facility] ENDOSCOPY;  Service: Endoscopy;  Laterality: N/A;   COLONOSCOPY WITH PROPOFOL N/A 11/23/2022   Procedure: COLONOSCOPY WITH PROPOFOL;  Surgeon: Regis Bill, MD;  Location: [facility] ENDOSCOPY;  Service: Endoscopy;  Laterality: N/A;   ESOPHAGOGASTRODUODENOSCOPY (EGD) WITH PROPOFOL N/A 11/23/2022   Procedure: ESOPHAGOGASTRODUODENOSCOPY (EGD) WITH PROPOFOL;  Surgeon: Regis Bill, MD;  Location: [facility] ENDOSCOPY;  Service: Endoscopy;  Laterality: N/A;   RIGHT HEART CATH N/A 11/15/2022   Procedure: RIGHT HEART CATH;  Surgeon: Yvonne Kendall, MD;   Location: [facility];  Service: Cardiovascular;  Laterality: N/A;   RIGHT/LEFT HEART CATH AND CORONARY ANGIOGRAPHY N/A 11/25/2022   Procedure: RIGHT/LEFT HEART CATH AND CORONARY ANGIOGRAPHY;  Surgeon: Iran Ouch, MD;  Location: [facility];  Service: Cardiovascular;  Laterality: N/A;   TAH/RSO  1999   secondary to bleeding and endometriosis (Dr Haskel Khan)    Social History   Socioeconomic History   Marital status: Married    Spouse name: Not on file   Number of children: Not on file   Years of education: Not on file   Highest education level: Not on file  Occupational History   Not on file  Tobacco Use   Smoking status: Former    Packs/day: 0.25    Years: 40.00    Additional pack years: 0.00    Total pack years: 10.00    Types: Cigarettes   Smokeless tobacco: Never   Tobacco comments:    Patient quit smoking recently   Vaping Use   Vaping Use: Never used  Substance and Sexual Activity   Alcohol use: Not Currently    Alcohol/week: 0.0 standard drinks of alcohol  Comment: occasional   Drug use: No   Sexual activity: Not Currently  Other Topics Concern   Not on file  Social History Narrative   Lives in [place] with her husband.  She is retired from [employer]	[place].  She does not routinely exercise.   Social Determinants of Health   Financial Resource Strain: Low Risk  (12/12/2022)   Overall Financial Resource Strain (CARDIA)    Difficulty of Paying Living Expenses: Not hard at all  Food Insecurity: No Food Insecurity (12/12/2022)   Hunger Vital Sign    Worried About Running Out of Food in the Last Year: Never true    Ran Out of Food in the Last Year: Never true  Transportation Needs: No Transportation Needs (12/12/2022)   PRAPARE - Administrator, Civil Service (Medical): No    Lack of Transportation (Non-Medical): No  Physical Activity: Sufficiently Active (12/12/2022)   Exercise Vital Sign    Days of Exercise per Week: 7  days    Minutes of Exercise per Session: 30 min  Stress: No Stress Concern Present (12/12/2022)   [employer] of Occupational Health - Occupational Stress Questionnaire    Feeling of Stress : Not at all  Social Connections: Unknown (12/12/2022)   Social Connection and Isolation Panel [NHANES]    Frequency of Communication with Friends and Family: Not on file    Frequency of Social Gatherings with Friends and Family: Not on file    Attends Religious Services: Not on file    Active Member of Clubs or Organizations: Not on file    Attends Banker Meetings: Not on file    Marital Status: Married  Intimate Partner Violence: Not At Risk (12/12/2022)   Humiliation, Afraid, Rape, and Kick questionnaire    Fear of Current or Ex-Partner: No    Emotionally Abused: No    Physically Abused: No    Sexually Abused: No    Family History  Problem Relation Age of Onset   Hypercholesterolemia Mother    Rheum arthritis Father    Rheum arthritis Daughter    Fibromyalgia Daughter    Breast cancer Neg Hx    Colon cancer Neg Hx      Current Outpatient Medications:    acetaminophen (TYLENOL) 650 MG CR tablet, Take 650 mg by mouth every 8 (eight) hours as needed for pain or fever., Disp: , Rfl:    albuterol (VENTOLIN HFA) 108 (90 Base) MCG/ACT inhaler, Inhale 2 puffs into the lungs every 6 (six) hours as needed for wheezing or shortness of breath., Disp: 8 g, Rfl: 2   amiodarone (PACERONE) 200 MG tablet, Take 1 tablet (200 mg total) by mouth daily., Disp: 30 tablet, Rfl: 1   apixaban (ELIQUIS) 2.5 MG TABS tablet, Take 1 tablet (2.5 mg total) by mouth 2 (two) times daily., Disp: 60 tablet, Rfl: 6   atorvastatin (LIPITOR) 40 MG tablet, Take 1 tablet (40 mg total) by mouth daily., Disp: 90 tablet, Rfl: 1   buPROPion (WELLBUTRIN XL) 150 MG 24 hr tablet, Take 1 tablet (150 mg total) by mouth daily., Disp: 90 tablet, Rfl: 1   calcium carbonate (OS-CAL - DOSED IN MG OF ELEMENTAL CALCIUM) 1250  (500 Ca) MG tablet, Take 1 tablet (500 mg of elemental calcium total) by mouth 3 (three) times daily with meals., Disp: 60 tablet, Rfl: 1   carvedilol (COREG) 6.25 MG tablet, Take 1.5 tablets (9.375 mg total) by mouth 2 (two) times daily with a meal.,  Disp: 60 tablet, Rfl: 1   FARXIGA 10 MG TABS tablet, Take 10 mg by mouth daily., Disp: , Rfl:    Fluticasone-Umeclidin-Vilant (TRELEGY ELLIPTA) 100-62.5-25 MCG/ACT AEPB, Inhale 1 puff into the lungs daily., Disp: 3 each, Rfl: 3   isosorbide-hydrALAZINE (BIDIL) 20-37.5 MG tablet, Take 1 tablet by mouth 3 (three) times daily., Disp: 90 tablet, Rfl: 1   levothyroxine (SYNTHROID) 125 MCG tablet, Take 1 tablet (125 mcg total) by mouth daily., Disp: 90 tablet, Rfl: 0   loratadine (CLARITIN) 10 MG tablet, Take 10 mg by mouth daily., Disp: , Rfl:    omeprazole (PRILOSEC) 20 MG capsule, Take 20 mg by mouth daily., Disp: , Rfl:    torsemide (DEMADEX) 20 MG tablet, Take 2 tablets (40 mg) every other day alternating with 60mg  every other day., Disp: 270 tablet, Rfl: 3   venlafaxine XR (EFFEXOR-XR) 150 MG 24 hr capsule, TAKE ONE CAPSULE BY MOUTH EVERY DAY, Disp: 90 capsule, Rfl: 1  Physical exam:  Vitals:   02/03/23 0950  BP: (!) 142/67  Pulse: 63  Resp: 20  Temp: 97.6 F (36.4 C)  SpO2: 100%  Weight: 118 lb 9.6 oz (53.8 kg)   Physical Exam Cardiovascular:     Rate and Rhythm: Normal rate and regular rhythm.     Heart sounds: Normal heart sounds.  Pulmonary:     Effort: Pulmonary effort is normal.  Skin:    General: Skin is warm and dry.  Neurological:     Mental Status: She is alert and oriented to person, place, and time.         Latest Ref Rng & Units 01/02/2023    7:31 AM  CMP  Glucose 70 - 99 mg/dL 91   BUN 6 - 23 mg/dL 47   Creatinine 6.06 - 1.20 mg/dL 3.01   Sodium 601 - 093 mEq/L 135   Potassium 3.5 - 5.1 mEq/L 5.0   Chloride 96 - 112 mEq/L 103   CO2 19 - 32 mEq/L 23   Calcium 8.4 - 10.5 mg/dL 8.8   Total Protein 6.0 - 8.3 g/dL  7.0   Total Bilirubin 0.2 - 1.2 mg/dL 0.3   Alkaline Phos 39 - 117 U/L 67   AST 0 - 37 U/L 20   ALT 0 - 35 U/L 15       Latest Ref Rng & Units 01/27/2023   10:19 AM  CBC  WBC 4.0 - 10.5 K/uL 6.0   Hemoglobin 12.0 - 15.0 g/dL 23.5   Hematocrit 57.3 - 46.0 % 41.0   Platelets 150 - 400 K/uL 209    Assessment and plan- Patient is a 67 y.o. female here for routine follow-up of anemia of chronic kidney disease  Patient's hemoglobin was down to 8.32 months ago.  After receiving IV iron presently her hemoglobin is 12.8.  Ferritin levels more than 100 and iron saturation more than 20%.  Patient does not require any IV iron at this time.  Will repeat CBC ferritin and iron studies in 3 and 6 months and I will see her back in 6 months  Uncontrolled hypothyroidism: She is on levothyroxine and has a follow-up with her primary care doctor tomorrow   Visit Diagnosis 1. Iron deficiency anemia, unspecified iron deficiency anemia type   2. Anemia of chronic kidney failure, stage 4 (severe) (HCC)      Dr. Owens Shark, MD, MPH [facility] at [facility] [phone] 02/03/2023 9:59 AM

## 2023-02-03 NOTE — Addendum Note (Signed)
Addended by: Corene Cornea on: 02/03/2023 10:20 AM   Modules accepted: Orders

## 2023-02-04 ENCOUNTER — Other Ambulatory Visit: Payer: Medicare Other

## 2023-02-04 ENCOUNTER — Other Ambulatory Visit
Admission: RE | Admit: 2023-02-04 | Discharge: 2023-02-04 | Disposition: A | Discharge status: Home / Self Care | Payer: Medicare Other | Source: Ambulatory Visit | Attending: Family | Admitting: Family

## 2023-02-04 ENCOUNTER — Ambulatory Visit (HOSPITAL_BASED_OUTPATIENT_CLINIC_OR_DEPARTMENT_OTHER): Payer: Medicare Other | Admitting: Family

## 2023-02-04 ENCOUNTER — Encounter: Payer: Self-pay | Admitting: Family

## 2023-02-04 ENCOUNTER — Other Ambulatory Visit (HOSPITAL_COMMUNITY): Payer: Self-pay

## 2023-02-04 VITALS — BP 145/70 | HR 63 | Wt 116.2 lb

## 2023-02-04 DIAGNOSIS — I5022 Chronic systolic (congestive) heart failure: Secondary | ICD-10-CM | POA: Insufficient documentation

## 2023-02-04 DIAGNOSIS — J432 Centrilobular emphysema: Secondary | ICD-10-CM

## 2023-02-04 DIAGNOSIS — I480 Paroxysmal atrial fibrillation: Secondary | ICD-10-CM

## 2023-02-04 DIAGNOSIS — D509 Iron deficiency anemia, unspecified: Secondary | ICD-10-CM

## 2023-02-04 DIAGNOSIS — I340 Nonrheumatic mitral (valve) insufficiency: Secondary | ICD-10-CM

## 2023-02-04 DIAGNOSIS — I10 Essential (primary) hypertension: Secondary | ICD-10-CM | POA: Diagnosis not present

## 2023-02-04 LAB — BASIC METABOLIC PANEL
Anion gap: 11 (ref 5–15)
BUN: 47 mg/dL — ABNORMAL HIGH (ref 8–23)
CO2: 24 mmol/L (ref 22–32)
Calcium: 8.9 mg/dL (ref 8.9–10.3)
Chloride: 101 mmol/L (ref 98–111)
Creatinine, Ser: 2.44 mg/dL — ABNORMAL HIGH (ref 0.44–1.00)
GFR, Estimated: 21 mL/min — ABNORMAL LOW (ref >60)
Glucose, Bld: 96 mg/dL (ref 70–99)
Potassium: 4.2 mmol/L (ref 3.5–5.1)
Sodium: 136 mmol/L (ref 135–145)

## 2023-02-04 LAB — BRAIN NATRIURETIC PEPTIDE: B Natriuretic Peptide: 1023 pg/mL — ABNORMAL HIGH (ref 0.0–100.0)

## 2023-02-04 NOTE — Progress Notes (Signed)
PCP: Dale [place], MD (last seen 05/24) Primary Cardiologist: Julien Nordmann, MD (last seen 05/24) HF provider: Marca Ancona, MD (last seen 04/24)  HPI:  Ms. Hanback is a 67 y.o. female with a history of COPD, prior tobacco abuse, paroxysmal atrial fibrillation and flutter, mitral regurgitation, hypertension, hyperlipidemia, stage IV chronic kidney disease, and systolic HF.     HF diagnosed in 6/21. Echo EF 30-35% in setting of new-onset AF.  Repeat echo in December 2021 showed improvement in EF to 50-55% with grade 1 diastolic dysfunction.  In April 2023, in the context of GI illness and weakness, she suffered a fall was found to have a subarachnoid hemorrhage.  Oral anticoagulation was initially held but then subsequently resumed once cleared by neurosurgery.     She required admissions in July 2023 and again in November 2023 in the setting of respiratory failure and volume overload.  Echo November 2023 showed an EF of 50 to 55%.  Unfortunately, she was readmitted in January 2024 with heart failure and pneumonia.  Repeat echo 09/28/22, which was performed during an episode of atrial flutter, showed an EF of 35 to 40%.  Following diuresis, she was discharged home.  Stress test performed in the outpatient setting was intermediate in the setting of an EF of 43% with apical infarct and mild peri-infarct ischemia.  No cath at that time due to CKD 4.    Admitted 11/12/22 with progressive weight gain and SOB. Labs showed acute on chronic normocytic anemia with an H&H of 7.1 and 23.9.  BNP was elevated at 3619.9.  Creatinine stable at 2.76 however, potassium was elevated at 5.5. CXR with vascular congestion and small bilateral pleural effusions.  ECG with NSR. Hstrop 9 and 10.  Echo 11/14/22 with EF 25-30% with global Hk, RV moderately down. Severe MR, mod-severe TR.  LHC/RHC showed normal coronaries, normal filling pressures, and preserved CO.   She presents today for a HF f/u visit with a chief complaint  of moderate fatigue with minimal exertion. Chronic in nature. Says that she sleeps well and does not snore. She denies SOB, cough, pedal edema, abdominal distention, dizziness or weight gain. Checks her blood pressure at home and says the top number is always in the 130's.   Says that she used to weigh 200 pounds so has "always been on a diet" and tries to be very conscious of the food that she eats. Says that she rinses "everything".   Unclear of medications as she told the PharmD she was taking 20mg  torsemide daily but then when I asked, she says she was taking 40mg  QOD alternating with 60mg  QOD. She also told PharmD she was taking the 9.375mg  carvedilol BID (1 and 1/2 tab) but then told me that she was only taking 6.25mg  BID (1 whole tablet).   ROS: All systems negative except as listed in HPI, PMH and Problem List.  SH:  Social History   Socioeconomic History   Marital status: Married    Spouse name: Not on file   Number of children: Not on file   Years of education: Not on file   Highest education level: Not on file  Occupational History   Not on file  Tobacco Use   Smoking status: Former    Packs/day: 0.25    Years: 40.00    Additional pack years: 0.00    Total pack years: 10.00    Types: Cigarettes   Smokeless tobacco: Never   Tobacco comments:    Patient quit  smoking recently   Vaping Use   Vaping Use: Never used  Substance and Sexual Activity   Alcohol use: Not Currently    Alcohol/week: 0.0 standard drinks of alcohol    Comment: occasional   Drug use: No   Sexual activity: Not Currently  Other Topics Concern   Not on file  Social History Narrative   Lives in [place] with her husband.  She is retired from [employer]	[place].  She does not routinely exercise.   Social Determinants of Health   Financial Resource Strain: Low Risk  (12/12/2022)   Overall Financial Resource Strain (CARDIA)    Difficulty of Paying Living Expenses: Not hard at all  Food  Insecurity: No Food Insecurity (12/12/2022)   Hunger Vital Sign    Worried About Running Out of Food in the Last Year: Never true    Ran Out of Food in the Last Year: Never true  Transportation Needs: No Transportation Needs (12/12/2022)   PRAPARE - Administrator, Civil Service (Medical): No    Lack of Transportation (Non-Medical): No  Physical Activity: Sufficiently Active (12/12/2022)   Exercise Vital Sign    Days of Exercise per Week: 7 days    Minutes of Exercise per Session: 30 min  Stress: No Stress Concern Present (12/12/2022)   [employer] of Occupational Health - Occupational Stress Questionnaire    Feeling of Stress : Not at all  Social Connections: Unknown (12/12/2022)   Social Connection and Isolation Panel [NHANES]    Frequency of Communication with Friends and Family: Not on file    Frequency of Social Gatherings with Friends and Family: Not on file    Attends Religious Services: Not on file    Active Member of Clubs or Organizations: Not on file    Attends Banker Meetings: Not on file    Marital Status: Married  Intimate Partner Violence: Not At Risk (12/12/2022)   Humiliation, Afraid, Rape, and Kick questionnaire    Fear of Current or Ex-Partner: No    Emotionally Abused: No    Physically Abused: No    Sexually Abused: No    FH:  Family History  Problem Relation Age of Onset   Hypercholesterolemia Mother    Rheum arthritis Father    Rheum arthritis Daughter    Fibromyalgia Daughter    Breast cancer Neg Hx    Colon cancer Neg Hx     Past Medical History:  Diagnosis Date   B12 deficiency    Cardiomyopathy (HCC)    a. 09/2022 Echo: EF 35-40%; b. 09/2022 MV: apical defect w/ mild peri-infarct ischemia. EF 43%.   Chronic HFrEF (heart failure with reduced ejection fraction) (HCC)    a. 01/2020 Echo: EF 30-35%; b. 07/2020 Echo: EF 50-55%; c. 06/2022 Echo: EF 50-55%; d. 09/2022 Echo: EF 35-40%, globh HK, mod reduced RV fxn, sev BAE, mod  MR.   CKD (chronic kidney disease), stage IV (HCC)    COPD (chronic obstructive pulmonary disease) (HCC)    Depression    Endometriosis    Hypertension    Mixed hyperlipidemia    Moderate mitral regurgitation    Normocytic anemia    PAF (paroxysmal atrial fibrillation) (HCC)    a. CHA2DS2VASc = 4-->eliquis 2.5 bid.   Paroxymsal Atrial flutter (HCC)    Tobacco abuse     Current Outpatient Medications  Medication Sig Dispense Refill   acetaminophen (TYLENOL) 650 MG CR tablet Take 650 mg by mouth every 8 (eight)  hours as needed for pain or fever.     albuterol (VENTOLIN HFA) 108 (90 Base) MCG/ACT inhaler Inhale 2 puffs into the lungs every 6 (six) hours as needed for wheezing or shortness of breath. 8 g 2   amiodarone (PACERONE) 200 MG tablet Take 1 tablet (200 mg total) by mouth daily. 30 tablet 1   apixaban (ELIQUIS) 2.5 MG TABS tablet Take 1 tablet (2.5 mg total) by mouth 2 (two) times daily. 60 tablet 6   atorvastatin (LIPITOR) 40 MG tablet Take 1 tablet (40 mg total) by mouth daily. 90 tablet 1   buPROPion (WELLBUTRIN XL) 150 MG 24 hr tablet Take 1 tablet (150 mg total) by mouth daily. 90 tablet 1   calcium carbonate (OS-CAL - DOSED IN MG OF ELEMENTAL CALCIUM) 1250 (500 Ca) MG tablet Take 1 tablet (500 mg of elemental calcium total) by mouth 3 (three) times daily with meals. 60 tablet 1   carvedilol (COREG) 6.25 MG tablet Take 1.5 tablets (9.375 mg total) by mouth 2 (two) times daily with a meal. 60 tablet 1   FARXIGA 10 MG TABS tablet Take 10 mg by mouth daily.     Fluticasone-Umeclidin-Vilant (TRELEGY ELLIPTA) 100-62.5-25 MCG/ACT AEPB Inhale 1 puff into the lungs daily. 3 each 3   isosorbide-hydrALAZINE (BIDIL) 20-37.5 MG tablet Take 1 tablet by mouth 3 (three) times daily. 90 tablet 1   levothyroxine (SYNTHROID) 125 MCG tablet Take 1 tablet (125 mcg total) by mouth daily. 90 tablet 0   loratadine (CLARITIN) 10 MG tablet Take 10 mg by mouth daily.     omeprazole (PRILOSEC) 20 MG  capsule Take 20 mg by mouth daily.     torsemide (DEMADEX) 20 MG tablet Take 2 tablets (40 mg) every other day alternating with 60mg  every other day. 270 tablet 3   venlafaxine XR (EFFEXOR-XR) 150 MG 24 hr capsule TAKE ONE CAPSULE BY MOUTH EVERY DAY 90 capsule 1   No current facility-administered medications for this visit.    Vitals:   02/04/23 0831  BP: (!) 145/70  Pulse: 63  SpO2: 100%  Weight: 116 lb 3.2 oz (52.7 kg)   Wt Readings from Last 3 Encounters:  02/04/23 116 lb 3.2 oz (52.7 kg)  02/03/23 118 lb 9.6 oz (53.8 kg)  01/08/23 117 lb 2 oz (53.1 kg)   Lab Results  Component Value Date   CREATININE 3.14 (H) 01/02/2023   CREATININE 2.87 (H) 12/02/2022   CREATININE 2.81 (H) 11/26/2022   PHYSICAL EXAM:  General:  Well appearing. No resp difficulty HEENT: normal Neck: supple. JVP flat. No lymphadenopathy or thryomegaly appreciated. Cor: PMI normal. Regular rate & rhythm. 1/6 systolic murmur LLSB Lungs: clear Abdomen: soft, nontender, nondistended. No hepatosplenomegaly. No bruits or masses.  Extremities: no cyanosis, clubbing, rash, edema Neuro: alert & orientedx3, cranial nerves grossly intact. Moves all 4 extremities w/o difficulty. Affect pleasant.   ECG: 01/08/23 @ cardiology office NSR   ASSESSMENT & PLAN:  1. NICM with reduced ejection fraction- - Cause of cardiomyopathy is uncertain, unlikely to be tachycardia-mediated cardiomyopathy as she appears to be staying in NSR on amiodarone. TSH was markedly high recently, hypothyroidism may play a role. - NYHA class III - euvolemic - weight down 9 pounds from last visit here 2 months ago - echo in 3/24 showed EF 25-30%, moderate RV dysfunction, severe MR, moderate-severe TR in the setting of NSR. - Cath in 4/24 showed no significant coronary disease.     - continue  torsemide to 60  mg daily alternating with 40 mg daily.   - BMET/BNP today  - continue Farxiga 10 mg daily.  - continue hydralazine 25mg  TID; no longer  on bidil per fill history although unclear why  - continue coreg 6.25 mg bid as HR 63 today - No ARB/ACEI/ARNI/spironolactone with CKD stage IV - saw HF provider Shirlee Latch) 04/24; will f/u with him after cMRI done - cMRI scheduled for 02/17/23 - BNP 11/12/22 was 3247.0 - PharmD reconciled meds w/ patient  2: HTN with CKD- - BP 145/70; home SBP's running 130's - saw PCP Lorin Picket) 05/24 - saw nephrology Thedore Mins) 04/24; returns tomorrow - BMP 01/02/23 showed sodium 135, potassium 5.0, creatinine 3.14 & GFR 14.76 - right renal atrophy on prior renal US 11/13/22   3: Paroxysmal Atrial fibrillation- - continue amiodarone 200mg  daily  - on levothyroxine daily for hypothyroidism - needs regular eye exam - LFT's 01/02/23 were normal  - saw cardiology Mariah Milling) 05/24  4: COPD- - no longer smoking - encouraged to continue to abstain  5: Anemia-  - Endoscopies negative in 3/24 - saw hematology Smith Robert) 06/24 - Hg 01/27/23 was 12.8   6: Valvular heart disease- - Severe MR and mod-severe TR on 3/24 echo. Murmur not prominent on exam.  - cMRI scheduled for 02/17/23 - consider Mitraclip if MR appears severe on followup imaging.  Return in 1 month, sooner if needed. Emphasized bringing med bottles to appointments

## 2023-02-04 NOTE — Patient Instructions (Addendum)
Go to the [facility] and get your lab work checked.

## 2023-02-05 ENCOUNTER — Encounter: Payer: Medicare Other | Admitting: Family

## 2023-02-13 ENCOUNTER — Encounter (HOSPITAL_COMMUNITY): Payer: Self-pay

## 2023-02-14 ENCOUNTER — Telehealth (HOSPITAL_COMMUNITY): Payer: Self-pay | Admitting: Emergency Medicine

## 2023-02-14 NOTE — Telephone Encounter (Signed)
Reaching out to patient to offer assistance regarding upcoming cardiac imaging study; pt verbalizes understanding of appt date/time, parking situation and where to check in, pre-test NPO status and medications ordered, and verified current allergies; name and call back number provided for further questions should they arise Endrit Gittins RN Navigator Cardiac Imaging [facility] [phone] office [phone] cell 

## 2023-02-14 NOTE — Telephone Encounter (Signed)
Attempted to call patient regarding upcoming cardiac MR appointment. Left message on voicemail with name and callback number Jeris Roser RN Navigator Cardiac Imaging [facility] [phone] Office [phone] Cell  

## 2023-02-17 ENCOUNTER — Ambulatory Visit (HOSPITAL_COMMUNITY): Admission: RE | Admit: 2023-02-17 | Payer: Medicare Other | Source: Ambulatory Visit

## 2023-02-18 ENCOUNTER — Ambulatory Visit: Payer: Self-pay

## 2023-02-18 NOTE — Patient Instructions (Signed)
Visit Information  Thank you for taking time to visit with me today. Please don't hesitate to contact me if I can be of assistance to you.   Following are the goals we discussed today:   Goals Addressed             This Visit's Progress    Continued improvement post hospitalization and management/ education of health conditions.       Interventions Today    Flowsheet Row Most Recent Value  Chronic Disease   Chronic disease during today's visit Congestive Heart Failure (CHF), Hypertension (HTN), Chronic Kidney Disease/End Stage Renal Disease (ESRD), Other  [anemia]  General Interventions   General Interventions Discussed/Reviewed General Interventions Reviewed, Doctor Visits, Labs  [evaluation of current treatment plan related to health conditions and patients adherence to plan as established by provider.  Assessed for BP, HF symptoms, fatigue]  Labs --  [Discussed recent CBC.]  Doctor Visits Discussed/Reviewed --  [discussed recent cardiology, oncology, and nephrology follow up appointments.  Reviewed scheduled/ upcoming appointments.]  Exercise Interventions   Exercise Discussed/Reviewed Physical Activity  [Discussed patients level of fatigue with physical activity/exertion]  Education Interventions   Education Provided --  [reviewed heart failure symptoms, fatigue level and conserviing energy. Advised to continue to monitor weight  and blood pressure and recording.]  Nutrition Interventions   Nutrition Discussed/Reviewed Nutrition Reviewed  [low salt diet]  Pharmacy Interventions   Pharmacy Dicussed/Reviewed Pharmacy Topics Reviewed  [reviewed medications and discussed compliance. Assessed for any changes in medication regimen.]              Our next appointment is by telephone on 03/26/23 at 10 am  Please call the care guide team at [phone] if you need to cancel or reschedule your appointment.   If you are experiencing a Mental Health or Behavioral Health Crisis or  need someone to talk to, please call the Suicide and Crisis Lifeline: 988 call 1-800-273-TALK (toll free, 24 hour hotline)  Patient verbalizes understanding of instructions and care plan provided today and agrees to view in MyChart. Active MyChart status and patient understanding of how to access instructions and care plan via MyChart confirmed with patient.     [name] RN,BSN,CCM [facility] Care Coordination [phone] direct line

## 2023-02-18 NOTE — Patient Outreach (Signed)
  Care Coordination   Follow Up Visit Note   02/18/2023 Name: Dawn Sherman MRN: [id number] DOB: 07-May-1956  Dawn Sherman is a 67 y.o. year old female who sees Dale Woodlyn, MD for primary care. I spoke with  Dawn Sherman by phone today.  What matters to the patients health and wellness today?  Patient states she is doing pretty good. She denies any increase in SOB, swelling or fatigue.  She states her blood pressures continue to range in the 130's/70's range with today's blood pressure being 130/75.  Patient reports having recent follow up with cardiologist, oncologist, and nephrologist. She states sh is scheduled to have an MRI on 7//3/24.     Goals Addressed             This Visit's Progress    Continued improvement post hospitalization and management/ education of health conditions.       Interventions Today    Flowsheet Row Most Recent Value  Chronic Disease   Chronic disease during today's visit Congestive Heart Failure (CHF), Hypertension (HTN), Chronic Kidney Disease/End Stage Renal Disease (ESRD), Other  [anemia]  General Interventions   General Interventions Discussed/Reviewed General Interventions Reviewed, Doctor Visits, Labs  [evaluation of current treatment plan related to health conditions and patients adherence to plan as established by provider.  Assessed for BP, HF symptoms, fatigue]  Labs --  [Discussed recent CBC.]  Doctor Visits Discussed/Reviewed --  [discussed recent cardiology, oncology, and nephrology follow up appointments.  Reviewed scheduled/ upcoming appointments.]  Exercise Interventions   Exercise Discussed/Reviewed Physical Activity  [Discussed patients level of fatigue with physical activity/exertion]  Education Interventions   Education Provided --  [reviewed heart failure symptoms, fatigue level and conserviing energy. Advised to continue to monitor weight  and blood pressure and recording.]  Nutrition Interventions   Nutrition  Discussed/Reviewed Nutrition Reviewed  [low salt diet]  Pharmacy Interventions   Pharmacy Dicussed/Reviewed Pharmacy Topics Reviewed  [reviewed medications and discussed compliance. Assessed for any changes in medication regimen.]              SDOH assessments and interventions completed:  No     Care Coordination Interventions:  Yes, provided   Follow up plan: Follow up call scheduled for 03/26/23    Encounter Outcome:  Pt. Visit Completed   [name] RN,BSN,CCM [facility] Care Coordination [phone] direct line

## 2023-02-25 ENCOUNTER — Telehealth (HOSPITAL_COMMUNITY): Payer: Self-pay | Admitting: *Deleted

## 2023-02-25 ENCOUNTER — Other Ambulatory Visit (INDEPENDENT_AMBULATORY_CARE_PROVIDER_SITE_OTHER): Payer: Medicare Other

## 2023-02-25 DIAGNOSIS — E039 Hypothyroidism, unspecified: Secondary | ICD-10-CM | POA: Diagnosis not present

## 2023-02-25 LAB — TSH: TSH: 13.64 u[IU]/mL — ABNORMAL HIGH (ref 0.35–5.50)

## 2023-02-25 NOTE — Telephone Encounter (Signed)
Attempted to call patient regarding upcoming cardiac MRI appointment. Left message on voicemail with name and callback number  Rhea Thrun RN Navigator Cardiac Imaging  [facility] [phone] Office [phone] Cell  

## 2023-02-26 ENCOUNTER — Ambulatory Visit (HOSPITAL_COMMUNITY)
Admission: RE | Admit: 2023-02-26 | Discharge: 2023-02-26 | Disposition: A | Discharge status: Home / Self Care | Payer: Medicare Other | Source: Ambulatory Visit | Attending: Cardiology | Admitting: Cardiology

## 2023-02-26 ENCOUNTER — Other Ambulatory Visit: Payer: Self-pay

## 2023-02-26 ENCOUNTER — Encounter (HOSPITAL_COMMUNITY): Payer: Self-pay

## 2023-02-26 DIAGNOSIS — I5022 Chronic systolic (congestive) heart failure: Secondary | ICD-10-CM

## 2023-02-26 MED ORDER — LEVOTHYROXINE SODIUM 137 MCG PO TABS: 137.0000 ug | ORAL_TABLET | Freq: Every day | ORAL | 2 refills | Status: DC

## 2023-03-03 ENCOUNTER — Encounter: Payer: Medicare Other | Admitting: Cardiology

## 2023-03-11 ENCOUNTER — Telehealth: Payer: Self-pay

## 2023-03-11 NOTE — Telephone Encounter (Signed)
-----   Message from [place] sent at 02/04/2023  9:35 PM EDT ----- Regarding: FW: COPD Medications If she is agreeable, recommend d/cing trelegy and starting stiolto 2 puffs q day.   Dr Lorin Picket ----- Message ----- From: Ronnald Ramp, [facility] Sent: 02/04/2023   9:31 AM EDT To: Dale [place], MD Subject: COPD Medications                               Hi Dr. Lorin Picket,   This patient saw me in the heart failure clinic and told me she cannot afford her Trelegy Ellipta - fluticasone/vilanterol/umeclidinium. Not sure if she needs to be on it. She states she hasn't need to use an inhaler in a long time.   Here is a list of inhalers we can potentially switch to:  LABA-LAMA  Anoro Ellipta - umeclidinium and vilanterol 55/22 mcg, 62.5/25 mcg  Bevespi Aerosphere - glycopyrrolate and formoterol 9/4.8 mcg  Duaklir Pressair - aclidinium and formoterol 400/12 mcg  Stiolto Respimat - olodaterol and tiotropium bromide 2.5/2.5 mcg   I feel like I have had good luck seeing stiolto being covered by insurances. I am not able to check her co-pay for her insurance because they are not contracted with [facility] pharmacies.   Thanks,  Paschal Dopp, PharmD, BCPS

## 2023-03-11 NOTE — Telephone Encounter (Signed)
LMTCB to see if patient is agreeable to switch her trelegy to stiolto 2 puffs per day.

## 2023-03-12 MED ORDER — TIOTROPIUM BROMIDE-OLODATEROL 2.5-2.5 MCG/ACT IN AERS: 2.0000 | INHALATION_SPRAY | Freq: Every day | RESPIRATORY_TRACT | 3 refills | Status: DC

## 2023-03-12 NOTE — Telephone Encounter (Signed)
Patient states she is returning a call from Rita Ohara, LPN.  I read Trisha's message to patient and patient states she is agreeable.

## 2023-03-12 NOTE — Telephone Encounter (Signed)
Stiolto inhaler sent to pharmacy

## 2023-03-12 NOTE — Addendum Note (Signed)
Addended by: Rita Ohara D on: 03/12/2023 09:55 AM   Modules accepted: Orders

## 2023-03-26 ENCOUNTER — Encounter (INDEPENDENT_AMBULATORY_CARE_PROVIDER_SITE_OTHER): Payer: Self-pay

## 2023-03-26 ENCOUNTER — Ambulatory Visit: Payer: Self-pay

## 2023-03-26 NOTE — Patient Outreach (Signed)
  Care Coordination   Follow Up Visit Note   03/26/2023 Name: Dawn Sherman MRN: [id number] DOB: [birth year]  Dawn Sherman is a 67 y.o. year old female who sees Dale Portage, MD for primary care. I spoke with  Dawn Sherman by phone today.  What matters to the patients health and wellness today?  Patient states she is doing really well.  She denies having any HF symptoms or fatigue. She reports blood pressure today was 120/68.  She states her blood pressures have been ranging in the 120's/ 60-70's.   Patient states she has been active walking daily. She reports purchasing herself a watch that will keep up with her steps.  She states she is averaging approximately 1500 steps per day.     Goals Addressed             This Visit's Progress    Continued improvement post hospitalization and management/ education of health conditions.       Interventions Today    Flowsheet Row Most Recent Value  Chronic Disease   Chronic disease during today's visit Congestive Heart Failure (CHF), Hypertension (HTN), Chronic Kidney Disease/End Stage Renal Disease (ESRD), Other  [anemia]  General Interventions   General Interventions Discussed/Reviewed General Interventions Reviewed, Doctor Visits  [evaluaton of current treatment plan for HF, HTN, CKD, Anemia and patients adherence to plan as advised by provider. Assessed for HF symptoms, blood pressure readings and fatigue level.]  Doctor Visits Discussed/Reviewed Doctor Visits Reviewed  [name] upcoming provider visits.]  Exercise Interventions   Exercise Discussed/Reviewed Physical Activity  [Assessed for patients physical activity.  Encouraged onging activity/ exercise and increase as tolerated]  Education Interventions   Education Provided Provided Education  [Advised to continue weighing daily and record.  Reviewed heart failure action plan.  Advised to keep follow up appointments with provider and notify provider of any new or ongoing symptoms.]   Nutrition Interventions   Nutrition Discussed/Reviewed Nutrition Reviewed  Pharmacy Interventions   Pharmacy Dicussed/Reviewed Pharmacy Topics Reviewed  [medications reviewed and compliance discussed.]              SDOH assessments and interventions completed:  No     Care Coordination Interventions:  Yes, provided   Follow up plan: Follow up call scheduled for 05/14/23    Encounter Outcome:  Pt. Visit Completed   [name] RN,BSN,CCM [facility] Care Coordination [phone] direct line

## 2023-03-26 NOTE — Patient Instructions (Signed)
Visit Information  Thank you for taking time to visit with me today. Please don't hesitate to contact me if I can be of assistance to you.   Following are the goals we discussed today:   Goals Addressed             This Visit's Progress    Continued improvement post hospitalization and management/ education of health conditions.       Interventions Today    Flowsheet Row Most Recent Value  Chronic Disease   Chronic disease during today's visit Congestive Heart Failure (CHF), Hypertension (HTN), Chronic Kidney Disease/End Stage Renal Disease (ESRD), Other  [anemia]  General Interventions   General Interventions Discussed/Reviewed General Interventions Reviewed, Doctor Visits  [evaluaton of current treatment plan for HF, HTN, CKD, Anemia and patients adherence to plan as advised by provider. Assessed for HF symptoms, blood pressure readings and fatigue level.]  Doctor Visits Discussed/Reviewed Doctor Visits Reviewed  [name] upcoming provider visits.]  Exercise Interventions   Exercise Discussed/Reviewed Physical Activity  [Assessed for patients physical activity.  Encouraged onging activity/ exercise and increase as tolerated]  Education Interventions   Education Provided Provided Education  [Advised to continue weighing daily and record.  Reviewed heart failure action plan.  Advised to keep follow up appointments with provider and notify provider of any new or ongoing symptoms.]  Nutrition Interventions   Nutrition Discussed/Reviewed Nutrition Reviewed  Pharmacy Interventions   Pharmacy Dicussed/Reviewed Pharmacy Topics Reviewed  [medications reviewed and compliance discussed.]              Our next appointment is by telephone on 05/14/23 at 10am  Please call the care guide team at [phone] if you need to cancel or reschedule your appointment.   If you are experiencing a Mental Health or Behavioral Health Crisis or need someone to talk to, please call the Suicide and  Crisis Lifeline: 988 call 1-800-273-TALK (toll free, 24 hour hotline)  Patient verbalizes understanding of instructions and care plan provided today and agrees to view in MyChart. Active MyChart status and patient understanding of how to access instructions and care plan via MyChart confirmed with patient.     [name] RN,BSN,CCM [facility] Care Coordination [phone] direct line

## 2023-04-01 ENCOUNTER — Telehealth (HOSPITAL_COMMUNITY): Payer: Self-pay | Admitting: *Deleted

## 2023-04-01 NOTE — Telephone Encounter (Signed)
Reaching out to patient to offer assistance regarding upcoming cardiac imaging study; pt verbalizes understanding of appt date/time, parking situation and where to check in, pre-test NPO status and medications ordered, and verified current allergies; name and call back number provided for further questions should they arise Johney Frame RN Navigator Cardiac Imaging [facility] [phone] office [phone] cell   Patient denies metal or claustrophobia.

## 2023-04-02 ENCOUNTER — Other Ambulatory Visit: Payer: Self-pay | Admitting: Cardiology

## 2023-04-02 ENCOUNTER — Ambulatory Visit
Admission: RE | Admit: 2023-04-02 | Discharge: 2023-04-02 | Disposition: A | Discharge status: Home / Self Care | Payer: Medicare Other | Source: Ambulatory Visit | Attending: Cardiology | Admitting: Cardiology

## 2023-04-02 ENCOUNTER — Other Ambulatory Visit: Payer: Medicare Other

## 2023-04-02 DIAGNOSIS — I5022 Chronic systolic (congestive) heart failure: Secondary | ICD-10-CM | POA: Diagnosis present

## 2023-04-02 MED ORDER — GADOBUTROL 1 MMOL/ML IV SOLN
8.0000 mL | Freq: Once | INTRAVENOUS | Status: AC | PRN
  Administered 2023-04-02: 8 mL via INTRAVENOUS

## 2023-04-10 ENCOUNTER — Other Ambulatory Visit: Payer: Self-pay

## 2023-04-10 MED ORDER — HYDRALAZINE HCL 25 MG PO TABS: 25.0000 mg | ORAL_TABLET | Freq: Three times a day (TID) | ORAL | 5 refills | Status: DC

## 2023-04-15 ENCOUNTER — Other Ambulatory Visit: Payer: Self-pay

## 2023-04-15 ENCOUNTER — Ambulatory Visit: Payer: Medicare Other | Attending: Cardiology | Admitting: Cardiology

## 2023-04-15 VITALS — BP 159/74 | HR 73 | Ht 65.0 in | Wt 121.0 lb

## 2023-04-15 DIAGNOSIS — R9431 Abnormal electrocardiogram [ECG] [EKG]: Secondary | ICD-10-CM | POA: Insufficient documentation

## 2023-04-15 DIAGNOSIS — J449 Chronic obstructive pulmonary disease, unspecified: Secondary | ICD-10-CM | POA: Insufficient documentation

## 2023-04-15 DIAGNOSIS — I340 Nonrheumatic mitral (valve) insufficiency: Secondary | ICD-10-CM | POA: Insufficient documentation

## 2023-04-15 DIAGNOSIS — I428 Other cardiomyopathies: Secondary | ICD-10-CM | POA: Diagnosis not present

## 2023-04-15 DIAGNOSIS — Z87891 Personal history of nicotine dependence: Secondary | ICD-10-CM | POA: Diagnosis not present

## 2023-04-15 DIAGNOSIS — E039 Hypothyroidism, unspecified: Secondary | ICD-10-CM | POA: Insufficient documentation

## 2023-04-15 DIAGNOSIS — D649 Anemia, unspecified: Secondary | ICD-10-CM | POA: Insufficient documentation

## 2023-04-15 DIAGNOSIS — I5022 Chronic systolic (congestive) heart failure: Secondary | ICD-10-CM

## 2023-04-15 DIAGNOSIS — I480 Paroxysmal atrial fibrillation: Secondary | ICD-10-CM

## 2023-04-15 DIAGNOSIS — N184 Chronic kidney disease, stage 4 (severe): Secondary | ICD-10-CM | POA: Diagnosis present

## 2023-04-15 DIAGNOSIS — I130 Hypertensive heart and chronic kidney disease with heart failure and stage 1 through stage 4 chronic kidney disease, or unspecified chronic kidney disease: Secondary | ICD-10-CM | POA: Diagnosis not present

## 2023-04-15 LAB — COMPREHENSIVE METABOLIC PANEL
ALT: 18 IU/L (ref 0–32)
AST: 18 IU/L (ref 0–40)
Albumin: 4.5 g/dL (ref 3.9–4.9)
Alkaline Phosphatase: 93 IU/L (ref 44–121)
BUN/Creatinine Ratio: 17 (ref 12–28)
BUN: 44 mg/dL — ABNORMAL HIGH (ref 8–27)
Bilirubin Total: <0.2 mg/dL (ref 0.0–1.2)
CO2: 22 mmol/L (ref 20–29)
Calcium: 9 mg/dL (ref 8.7–10.3)
Chloride: 103 mmol/L (ref 96–106)
Creatinine, Ser: 2.61 mg/dL — ABNORMAL HIGH (ref 0.57–1.00)
Globulin, Total: 1.9 g/dL (ref 1.5–4.5)
Glucose: 72 mg/dL (ref 70–99)
Potassium: 5.4 mmol/L — ABNORMAL HIGH (ref 3.5–5.2)
Sodium: 139 mmol/L (ref 134–144)
Total Protein: 6.4 g/dL (ref 6.0–8.5)
eGFR: 20 mL/min/{1.73_m2} — ABNORMAL LOW (ref >59)

## 2023-04-15 LAB — CBC
Hematocrit: 41.4 % (ref 34.0–46.6)
Hemoglobin: 13.6 g/dL (ref 11.1–15.9)
MCH: 31.2 pg (ref 26.6–33.0)
MCHC: 32.9 g/dL (ref 31.5–35.7)
MCV: 95 fL (ref 79–97)
Platelets: 129 10*3/uL — ABNORMAL LOW (ref 150–450)
RBC: 4.36 x10E6/uL (ref 3.77–5.28)
RDW: 12.7 % (ref 11.7–15.4)
WBC: 6.9 10*3/uL (ref 3.4–10.8)

## 2023-04-15 MED ORDER — ISOSORBIDE MONONITRATE ER 30 MG PO TB24: 30.0000 mg | ORAL_TABLET | Freq: Every day | ORAL | 3 refills | Status: DC

## 2023-04-15 MED ORDER — CARVEDILOL 12.5 MG PO TABS: 12.5000 mg | ORAL_TABLET | Freq: Two times a day (BID) | ORAL | 3 refills | Status: DC

## 2023-04-15 NOTE — Patient Instructions (Signed)
Medication Changes:  Increase your Coreg to 12.5 mg  2 times a day  Take Imdur 30 mg (1 tablet) daily.  Lab Work:  Labs done today, your results will be available in MyChart, we will contact you for abnormal readings.     Referrals:  You have been referred to referral to Cardiac Electrophysiology. You will receive a call from their clinic to schedule this appointment.   Special Instructions // Education:  Do the following things EVERYDAY: Weigh yourself in the morning before breakfast. Write it down and keep it in a log. Take your medicines as prescribed Eat low salt foods--Limit salt (sodium) to 2000 mg per day.  Stay as active as you can everyday Limit all fluids for the day to less than 2 liters   Follow-Up in: follow up in 3 months.    If you have any questions or concerns before your next appointment please send [place] a message through [place] or call our office at [phone] Monday-Friday 8 am-5 pm.   If you have an urgent need after hours on the weekend please call your Primary Cardiologist or the [facility] in [place] at [phone].

## 2023-04-15 NOTE — Progress Notes (Signed)
PCP: Dale Royal, MD HF Cardiology: Dr. Gala Romney  Dawn Sherman is a 67 y.o. female with a history of COPD, prior tobacco abuse, paroxysmal atrial fibrillation and flutter, mitral regurgitation, hypertension, hyperlipidemia, stage IV chronic kidney disease, and systolic HF.     HF diagnosed in 6/21. Echo EF 30-35% in setting of new-onset AF.  Repeat echo in December 2021 showed improvement in EF to 50-55% with grade 1 diastolic dysfunction.  In April 2023, in the context of GI illness and weakness, she suffered a fall was found to have a subarachnoid hemorrhage.  Oral anticoagulation was initially held but then subsequently resumed once cleared by neurosurgery.     She required admissions in July 2023 and again in November 2023 in the setting of respiratory failure and volume overload.  Echo November 2023 showed an EF of 50 to 55%.  Unfortunately, she was readmitted in January 2024 with heart failure and pneumonia.  Repeat echo 09/28/22, which was performed during an episode of atrial flutter, showed an EF of 35 to 40%.  Following diuresis, she was discharged home.  Stress test performed in the outpatient setting was intermediate in the setting of an EF of 43% with apical infarct and mild peri-infarct ischemia.  No cath at that time due to CKD 4.    Admitted 11/14/22 with progressive weight gain and SOB. Labs showed acute on chronic normocytic anemia with an H&H of 7.1 and 23.9.  BNP was elevated at 3619.9.  Creatinine stable at 2.76 however, potassium was elevated at 5.5. CXR with vascular congestion and small bilateral pleural effusions.  ECG with NSR. Hstrop 9 and 10.  Echo 11/14/22 with EF 25-30% with global Hk, RV moderately down. Severe MR, mod-severe TR.  LHC/RHC showed normal coronaries, normal filling pressures, and preserved CO.   Cardiac MRI in 8/24 with LV EF 47%, no myocardial delayed enhancement, normal RV size, RV EF 59%, mild MR (improved).    Patient returns today for followup of CHF.   She seems confused by some of her medications.  I think she is only taking Coreg 6.25 mg bid and based on pharmacy refills, she is on hydralazine 25 mg tid and not Bidil. Weight up 5 lbs.  BP high in office today, she says SBP runs 120s when she checks at home ("every other day").  She is in NSR today, denies palpitations.  No dyspnea walking up stairs. No dyspnea walking on flat ground. No lightheadedness.    Labs (4/24): TSH 165, K 5.2, creatinine 2.87 Labs (6/24): K 4.2, creatinine 2.44, BNP 1023, myeloma panel negative, hgb 12.7 Labs (7/24): TSH 13 (coming down)  ECG (personally reviewed): NSR, 1st degree AVB, inferolateral TWIs  PMH: 1. Atrial fibrillation: Paroxysmal 2. COPD: Prior smoker.  3. HTN 4. Hyperlipidemia 5. CKD stage 4 6. Subarachnoid hemorrhage: 4/23, traumatic after fall.  7. Chronic systolic CHF: Nonischemic cardiomyopathy.  - Echo (6/21): EF 30-35% in setting of AF/RVR. - Echo (12/21): EF 50-55% - Echo (11/23): EF 50-55%, normal RV, mild AS - Echo (3/24): EF 25-30%, moderate RV dysfunction, severe MR, moderate-severe TR.  - LHC/RHC (4/24): Normal coronaries; mean RA 6, PA 50/17 mean 30, mean PCWP 16, CI 3.7.  - Cardiac MRI (8/24): LV EF 47%, no myocardial delayed enhancement, normal RV size, RV EF 59%, mild MR (improved).  8. Anemia: Negative endoscopies 9. Hypothyroidism  Social History   Socioeconomic History   Marital status: Married    Spouse name: Not on file   Number of  children: Not on file   Years of education: Not on file   Highest education level: Not on file  Occupational History   Not on file  Tobacco Use   Smoking status: Former    Current packs/day: 0.25    Average packs/day: 0.3 packs/day for 40.0 years (10.0 ttl pk-yrs)    Types: Cigarettes   Smokeless tobacco: Never   Tobacco comments:    Patient quit smoking recently   Vaping Use   Vaping status: Never Used  Substance and Sexual Activity   Alcohol use: Not Currently     Alcohol/week: 0.0 standard drinks of alcohol    Comment: occasional   Drug use: No   Sexual activity: Not Currently  Other Topics Concern   Not on file  Social History Narrative   Lives in [place] with her husband.  She is retired from [employer]	[place].  She does not routinely exercise.   Social Determinants of Health   Financial Resource Strain: Low Risk  (12/12/2022)   Overall Financial Resource Strain (CARDIA)    Difficulty of Paying Living Expenses: Not hard at all  Food Insecurity: No Food Insecurity (12/12/2022)   Hunger Vital Sign    Worried About Running Out of Food in the Last Year: Never true    Ran Out of Food in the Last Year: Never true  Transportation Needs: No Transportation Needs (12/12/2022)   PRAPARE - Administrator, Civil Service (Medical): No    Lack of Transportation (Non-Medical): No  Physical Activity: Sufficiently Active (12/12/2022)   Exercise Vital Sign    Days of Exercise per Week: 7 days    Minutes of Exercise per Session: 30 min  Stress: No Stress Concern Present (12/12/2022)   [employer] of Occupational Health - Occupational Stress Questionnaire    Feeling of Stress : Not at all  Social Connections: Unknown (12/12/2022)   Social Connection and Isolation Panel [NHANES]    Frequency of Communication with Friends and Family: Not on file    Frequency of Social Gatherings with Friends and Family: Not on file    Attends Religious Services: Not on file    Active Member of Clubs or Organizations: Not on file    Attends Banker Meetings: Not on file    Marital Status: Married  Intimate Partner Violence: Not At Risk (12/12/2022)   Humiliation, Afraid, Rape, and Kick questionnaire    Fear of Current or Ex-Partner: No    Emotionally Abused: No    Physically Abused: No    Sexually Abused: No   Family History  Problem Relation Age of Onset   Hypercholesterolemia Mother    Rheum arthritis Father    Rheum arthritis  Daughter    Fibromyalgia Daughter    Breast cancer Neg Hx    Colon cancer Neg Hx    ROS: All systems reviewed and negative except as per HPI.   Current Outpatient Medications  Medication Sig Dispense Refill   acetaminophen (TYLENOL) 650 MG CR tablet Take 650 mg by mouth every 8 (eight) hours as needed for pain or fever.     albuterol (VENTOLIN HFA) 108 (90 Base) MCG/ACT inhaler Inhale 2 puffs into the lungs every 6 (six) hours as needed for wheezing or shortness of breath. 8 g 2   amiodarone (PACERONE) 200 MG tablet Take 1 tablet (200 mg total) by mouth daily. 30 tablet 1   apixaban (ELIQUIS) 2.5 MG TABS tablet Take 1 tablet (2.5 mg total) by  mouth 2 (two) times daily. 60 tablet 6   atorvastatin (LIPITOR) 40 MG tablet Take 1 tablet (40 mg total) by mouth daily. 90 tablet 1   buPROPion (WELLBUTRIN XL) 150 MG 24 hr tablet Take 1 tablet (150 mg total) by mouth daily. 90 tablet 1   calcium carbonate (OS-CAL - DOSED IN MG OF ELEMENTAL CALCIUM) 1250 (500 Ca) MG tablet Take 1 tablet (500 mg of elemental calcium total) by mouth 3 (three) times daily with meals. 60 tablet 1   carvedilol (COREG) 12.5 MG tablet Take 1 tablet (12.5 mg total) by mouth 2 (two) times daily. 90 tablet 3   FARXIGA 10 MG TABS tablet Take 10 mg by mouth daily.     hydrALAZINE (APRESOLINE) 25 MG tablet Take 1 tablet (25 mg total) by mouth 3 (three) times daily. 90 tablet 5   levothyroxine (SYNTHROID) 137 MCG tablet Take 1 tablet (137 mcg total) by mouth daily. 30 tablet 2   loratadine (CLARITIN) 10 MG tablet Take 10 mg by mouth daily.     omeprazole (PRILOSEC) 20 MG capsule Take 20 mg by mouth daily.     Tiotropium Bromide-Olodaterol 2.5-2.5 MCG/ACT AERS Inhale 2 puffs into the lungs daily. 4 g 3   torsemide (DEMADEX) 20 MG tablet Take 2 tablets (40 mg) every other day alternating with 60mg  every other day. 270 tablet 3   venlafaxine XR (EFFEXOR-XR) 150 MG 24 hr capsule TAKE ONE CAPSULE BY MOUTH EVERY DAY 90 capsule 1    isosorbide mononitrate (IMDUR) 30 MG 24 hr tablet Take 1 tablet (30 mg total) by mouth daily. 90 tablet 3   No current facility-administered medications for this visit.   BP (!) 159/74   Pulse 73   Ht 5\' 5"  (1.651 m)   Wt 121 lb (54.9 kg)   SpO2 99%   BMI 20.14 kg/m  General: NAD Neck: No JVD, no thyromegaly or thyroid nodule.  Lungs: Clear to auscultation bilaterally with normal respiratory effort. CV: Nondisplaced PMI.  Heart regular S1/S2, no S3/S4, no murmur.  No peripheral edema.  No carotid bruit.  Normal pedal pulses.  Abdomen: Soft, nontender, no hepatosplenomegaly, no distention.  Skin: Intact without lesions or rashes.  Neurologic: Alert and oriented x 3.  Psych: Normal affect. Extremities: No clubbing or cyanosis.  HEENT: Normal.   Assessment/Plan: 1. Chronic systolic CHF: Nonischemic cardiomyopathy.  EF has fluctuated with atrial fibrillation in the past, but echo in 3/24 showed EF 25-30%, moderate RV dysfunction, severe MR, moderate-severe TR in the setting of NSR.  Cath in 4/24 showed no significant coronary disease.  Cardiac MRI in 8/24 showed improvement; LV EF 47%, no myocardial delayed enhancement, normal RV size, RV EF 59%, mild MR. Cause of cardiomyopathy is uncertain, unlikely to be tachycardia-mediated cardiomyopathy as she appears to have been staying in NSR on amiodarone. TSH was markedly high until Levoxyl started, hypothyroidism may play a role.  No h/o ETOH/drugs, no strong family history of cardiomyopathy.  She is not volume overloaded on exam, NYHA class II.  - Continue torsemide 60 mg daily alternating with 40 mg daily.  BMET/BNP today.   - Continue Farxiga 10 mg daily.  - Continue hydralazine 25 mg tid but will add Imdur 30 mg daily.  - Increase Coreg to 12.5 mg bid.  - No ARB/ACEI/ARNI/spironolactone with CKD stage IV.  2. CKD stage IV: Follow BMET carefully, check today.  3. Atrial fibrillation: Paroxysmal.  She appears to be holding NSR on amiodarone.   -  Continue amiodarone 200 mg daily.  Check LFTs.  She has started on treatment for hypothyroidism.  Needs regular eye exam.  - Refer to EP to consider ablation of atrial fibrillation, hopefully this would allow [place] to stop amiodarone safely.  4. COPD: She has quit smoking, I congratulated her.  5. Hypothyroidism: ?Contribution to CHF.  TSH significantly improved to 13, was very high before.  ?If hypothyroidism played a role in cardiomyopathy as EF has also improved.    - Continue Levothyroxine directed by PCP.  6. Anemia: Endoscopies negative in 3/24.    7. HTN: Associated with right renal atrophy on prior renal US.  - I ordered renal artery dopplers to assess for renal artery stenosis.  8. Valvular heart disease: Severe MR and mod-severe TR on 3/24 echo. Murmur not prominent on exam. Cardiac MRI showed only mild mitral regurgitation in setting of improved LV and RV function.  - Continue to follow.  Followup 3 months.    Dawn Sherman 04/15/2023

## 2023-04-16 ENCOUNTER — Ambulatory Visit: Payer: Medicare Other | Admitting: Internal Medicine

## 2023-04-17 ENCOUNTER — Telehealth (HOSPITAL_COMMUNITY): Payer: Self-pay

## 2023-04-17 DIAGNOSIS — I5022 Chronic systolic (congestive) heart failure: Secondary | ICD-10-CM

## 2023-04-17 NOTE — Telephone Encounter (Addendum)
Pt aware, agreeable, and verbalized understanding  Labs ordered   ----- Message from Marca Ancona sent at 04/16/2023  1:33 PM EDT ----- K elevated 5.4.  Follow low K diet, make sure no K supplement.  Repeat BMET 1 week to make sure no trend.

## 2023-04-23 ENCOUNTER — Ambulatory Visit (INDEPENDENT_AMBULATORY_CARE_PROVIDER_SITE_OTHER): Payer: Medicare Other | Admitting: Internal Medicine

## 2023-04-23 ENCOUNTER — Encounter: Payer: Self-pay | Admitting: Internal Medicine

## 2023-04-23 VITALS — BP 132/75 | HR 80 | Temp 98.5°F | Ht 65.0 in | Wt 120.4 lb

## 2023-04-23 DIAGNOSIS — F320 Major depressive disorder, single episode, mild: Secondary | ICD-10-CM

## 2023-04-23 DIAGNOSIS — J439 Emphysema, unspecified: Secondary | ICD-10-CM

## 2023-04-23 DIAGNOSIS — I420 Dilated cardiomyopathy: Secondary | ICD-10-CM

## 2023-04-23 DIAGNOSIS — N184 Chronic kidney disease, stage 4 (severe): Secondary | ICD-10-CM | POA: Diagnosis not present

## 2023-04-23 DIAGNOSIS — I5020 Unspecified systolic (congestive) heart failure: Secondary | ICD-10-CM

## 2023-04-23 DIAGNOSIS — E875 Hyperkalemia: Secondary | ICD-10-CM

## 2023-04-23 DIAGNOSIS — Z1211 Encounter for screening for malignant neoplasm of colon: Secondary | ICD-10-CM

## 2023-04-23 DIAGNOSIS — I480 Paroxysmal atrial fibrillation: Secondary | ICD-10-CM

## 2023-04-23 DIAGNOSIS — E7800 Pure hypercholesterolemia, unspecified: Secondary | ICD-10-CM

## 2023-04-23 DIAGNOSIS — E039 Hypothyroidism, unspecified: Secondary | ICD-10-CM | POA: Diagnosis not present

## 2023-04-23 DIAGNOSIS — I10 Essential (primary) hypertension: Secondary | ICD-10-CM

## 2023-04-23 DIAGNOSIS — K219 Gastro-esophageal reflux disease without esophagitis: Secondary | ICD-10-CM

## 2023-04-23 DIAGNOSIS — D649 Anemia, unspecified: Secondary | ICD-10-CM

## 2023-04-23 LAB — BASIC METABOLIC PANEL
BUN: 40 mg/dL — ABNORMAL HIGH (ref 6–23)
CO2: 25 mEq/L (ref 19–32)
Calcium: 9 mg/dL (ref 8.4–10.5)
Chloride: 104 mEq/L (ref 96–112)
Creatinine, Ser: 2.53 mg/dL — ABNORMAL HIGH (ref 0.40–1.20)
GFR: 19.09 mL/min — ABNORMAL LOW (ref >60.00)
Glucose, Bld: 91 mg/dL (ref 70–99)
Potassium: 5.2 mEq/L — ABNORMAL HIGH (ref 3.5–5.1)
Sodium: 140 mEq/L (ref 135–145)

## 2023-04-23 LAB — TSH: TSH: 0.51 u[IU]/mL (ref 0.35–5.50)

## 2023-04-23 NOTE — Progress Notes (Signed)
Subjective:    Patient ID: Dawn Sherman, female    DOB: 09-23-1955, 67 y.o.   MRN: [id number]  Patient here for  Chief Complaint  Patient presents with   Medical Management of Chronic Issues    HPI Here to follow up regarding hypercholesterolemia, afib, hypertension, CHF and depression. Had f/u with cardiology 04/15/23 - chronic systolic CHF: Nonischemic cardiomyopathy. EF has fluctuated with atrial fibrillation in the past, but echo in 3/24 showed EF 25-30%, moderate RV dysfunction, severe MR, moderate-severe TR. Cath in 4/24 showed no significant coronary disease.  Cardiac MRI in 8/24 showed improvement; LV EF 47%, no myocardial delayed enhancement, normal RV size, RV EF 59%, mild MR. Recommended continuing torsemide 60mg  alternating with 40mg . She reports she is taking 40mg  bid. Continue farxiga and hydralazine.  Added imdur 30mg  daily.  Coreg also increased to 12.5mg  bid. Referred to EP and renal artery dopplers ordered. Followed by Dr Smith Robert - [facility].  Last evaluated 02/03/23 - hgb 12.8.  she reports she feels she is doing relatively well.  No increased sob.  No chest pain reported. Tolerating medication changes.  Eating.  No nausea or vomiting.  No bowel change reported.    Past Medical History:  Diagnosis Date   B12 deficiency    Cardiomyopathy (HCC)    a. 09/2022 Echo: EF 35-40%; b. 09/2022 MV: apical defect w/ mild peri-infarct ischemia. EF 43%.   Chronic HFrEF (heart failure with reduced ejection fraction) (HCC)    a. 01/2020 Echo: EF 30-35%; b. 07/2020 Echo: EF 50-55%; c. 06/2022 Echo: EF 50-55%; d. 09/2022 Echo: EF 35-40%, globh HK, mod reduced RV fxn, sev BAE, mod MR.   CKD (chronic kidney disease), stage IV (HCC)    COPD (chronic obstructive pulmonary disease) (HCC)    Depression    Endometriosis    Hypertension    Mixed hyperlipidemia    Moderate mitral regurgitation    Normocytic anemia    PAF (paroxysmal atrial fibrillation) (HCC)    a. CHA2DS2VASc = 4-->eliquis 2.5 bid.    Paroxymsal Atrial flutter (HCC)    Tobacco abuse    Past Surgical History:  Procedure Laterality Date   ABDOMINAL HYSTERECTOMY     BREAST BIOPSY Right 2015   neg-  FIBROADENOMA   CENTRAL LINE INSERTION  11/15/2022   Procedure: CENTRAL LINE INSERTION;  Surgeon: Yvonne Kendall, MD;  Location: [facility];  Service: Cardiovascular;;   COLONOSCOPY WITH PROPOFOL N/A 11/24/2022   Procedure: COLONOSCOPY WITH PROPOFOL;  Surgeon: Regis Bill, MD;  Location: [facility];  Service: Endoscopy;  Laterality: N/A;   COLONOSCOPY WITH PROPOFOL N/A 11/23/2022   Procedure: COLONOSCOPY WITH PROPOFOL;  Surgeon: Regis Bill, MD;  Location: [facility];  Service: Endoscopy;  Laterality: N/A;   ESOPHAGOGASTRODUODENOSCOPY (EGD) WITH PROPOFOL N/A 11/23/2022   Procedure: ESOPHAGOGASTRODUODENOSCOPY (EGD) WITH PROPOFOL;  Surgeon: Regis Bill, MD;  Location: [facility];  Service: Endoscopy;  Laterality: N/A;   RIGHT HEART CATH N/A 11/15/2022   Procedure: RIGHT HEART CATH;  Surgeon: Yvonne Kendall, MD;  Location: [facility];  Service: Cardiovascular;  Laterality: N/A;   RIGHT/LEFT HEART CATH AND CORONARY ANGIOGRAPHY N/A 11/25/2022   Procedure: RIGHT/LEFT HEART CATH AND CORONARY ANGIOGRAPHY;  Surgeon: Iran Ouch, MD;  Location: [facility];  Service: Cardiovascular;  Laterality: N/A;   TAH/RSO  1999   secondary to bleeding and endometriosis (Dr Haskel Khan)   Family History  Problem Relation Age of Onset   Hypercholesterolemia Mother  Rheum arthritis Father    Rheum arthritis Daughter    Fibromyalgia Daughter    Breast cancer Neg Hx    Colon cancer Neg Hx    Social History   Socioeconomic History   Marital status: Married    Spouse name: Not on file   Number of children: Not on file   Years of education: Not on file   Highest education level: Not on file  Occupational History   Not on file  Tobacco Use   Smoking status: Former    Current  packs/day: 0.25    Average packs/day: 0.3 packs/day for 40.0 years (10.0 ttl pk-yrs)    Types: Cigarettes   Smokeless tobacco: Never   Tobacco comments:    Patient quit smoking recently   Vaping Use   Vaping status: Never Used  Substance and Sexual Activity   Alcohol use: Not Currently    Alcohol/week: 0.0 standard drinks of alcohol    Comment: occasional   Drug use: No   Sexual activity: Not Currently  Other Topics Concern   Not on file  Social History Narrative   Lives in [place] with her husband.  She is retired from [employer]	[place].  She does not routinely exercise.   Social Determinants of Health   Financial Resource Strain: Low Risk  (12/12/2022)   Overall Financial Resource Strain (CARDIA)    Difficulty of Paying Living Expenses: Not hard at all  Food Insecurity: No Food Insecurity (12/12/2022)   Hunger Vital Sign    Worried About Running Out of Food in the Last Year: Never true    Ran Out of Food in the Last Year: Never true  Transportation Needs: No Transportation Needs (12/12/2022)   PRAPARE - Administrator, Civil Service (Medical): No    Lack of Transportation (Non-Medical): No  Physical Activity: Sufficiently Active (12/12/2022)   Exercise Vital Sign    Days of Exercise per Week: 7 days    Minutes of Exercise per Session: 30 min  Stress: No Stress Concern Present (12/12/2022)   [employer] of Occupational Health - Occupational Stress Questionnaire    Feeling of Stress : Not at all  Social Connections: Unknown (12/12/2022)   Social Connection and Isolation Panel [NHANES]    Frequency of Communication with Friends and Family: Not on file    Frequency of Social Gatherings with Friends and Family: Not on file    Attends Religious Services: Not on file    Active Member of Clubs or Organizations: Not on file    Attends Banker Meetings: Not on file    Marital Status: Married     Review of Systems  Constitutional:  Negative for  appetite change and unexpected weight change.  HENT:  Negative for congestion and sinus pressure.   Respiratory:  Negative for cough and chest tightness.        No increased sob.   Cardiovascular:  Negative for chest pain and palpitations.       No increased leg swelling.   Gastrointestinal:  Negative for abdominal pain, diarrhea, nausea and vomiting.  Genitourinary:  Negative for difficulty urinating and dysuria.  Musculoskeletal:  Negative for joint swelling and myalgias.  Skin:  Negative for color change and rash.  Neurological:  Negative for dizziness and headaches.  Psychiatric/Behavioral:  Negative for agitation and dysphoric mood.        Objective:     BP 132/75   Pulse 80   Temp 98.5 F (36.9  C) (Oral)   Ht 5\' 5"  (1.651 m)   Wt 120 lb 6.4 oz (54.6 kg)   SpO2 97%   BMI 20.04 kg/m  Wt Readings from Last 3 Encounters:  04/23/23 120 lb 6.4 oz (54.6 kg)  04/15/23 121 lb (54.9 kg)  02/04/23 116 lb 3.2 oz (52.7 kg)    Physical Exam Vitals reviewed.  Constitutional:      General: She is not in acute distress.    Appearance: Normal appearance.  HENT:     Head: Normocephalic and atraumatic.     Right Ear: External ear normal.     Left Ear: External ear normal.  Eyes:     General: No scleral icterus.       Right eye: No discharge.        Left eye: No discharge.     Conjunctiva/sclera: Conjunctivae normal.  Neck:     Thyroid: No thyromegaly.  Cardiovascular:     Rate and Rhythm: Normal rate and regular rhythm.  Pulmonary:     Effort: No respiratory distress.     Breath sounds: Normal breath sounds. No wheezing.  Abdominal:     General: Bowel sounds are normal.     Palpations: Abdomen is soft.     Tenderness: There is no abdominal tenderness.  Musculoskeletal:        General: No swelling or tenderness.     Cervical back: Neck supple. No tenderness.  Lymphadenopathy:     Cervical: No cervical adenopathy.  Skin:    Findings: No erythema or rash.   Neurological:     Mental Status: She is alert.  Psychiatric:        Mood and Affect: Mood normal.        Behavior: Behavior normal.      Outpatient Encounter Medications as of 04/23/2023  Medication Sig   acetaminophen (TYLENOL) 650 MG CR tablet Take 650 mg by mouth every 8 (eight) hours as needed for pain or fever.   albuterol (VENTOLIN HFA) 108 (90 Base) MCG/ACT inhaler Inhale 2 puffs into the lungs every 6 (six) hours as needed for wheezing or shortness of breath.   amiodarone (PACERONE) 200 MG tablet Take 1 tablet (200 mg total) by mouth daily.   apixaban (ELIQUIS) 2.5 MG TABS tablet Take 1 tablet (2.5 mg total) by mouth 2 (two) times daily.   atorvastatin (LIPITOR) 40 MG tablet Take 1 tablet (40 mg total) by mouth daily.   buPROPion (WELLBUTRIN XL) 150 MG 24 hr tablet Take 1 tablet (150 mg total) by mouth daily.   calcium carbonate (OS-CAL - DOSED IN MG OF ELEMENTAL CALCIUM) 1250 (500 Ca) MG tablet Take 1 tablet (500 mg of elemental calcium total) by mouth 3 (three) times daily with meals.   carvedilol (COREG) 12.5 MG tablet Take 1 tablet (12.5 mg total) by mouth 2 (two) times daily.   FARXIGA 10 MG TABS tablet Take 10 mg by mouth daily.   hydrALAZINE (APRESOLINE) 25 MG tablet Take 1 tablet (25 mg total) by mouth 3 (three) times daily.   isosorbide mononitrate (IMDUR) 30 MG 24 hr tablet Take 1 tablet (30 mg total) by mouth daily.   levothyroxine (SYNTHROID) 137 MCG tablet Take 1 tablet (137 mcg total) by mouth daily.   loratadine (CLARITIN) 10 MG tablet Take 10 mg by mouth daily.   omeprazole (PRILOSEC) 20 MG capsule Take 20 mg by mouth daily.   Tiotropium Bromide-Olodaterol 2.5-2.5 MCG/ACT AERS Inhale 2 puffs into the lungs daily.  torsemide (DEMADEX) 20 MG tablet Take 2 tablets (40 mg) every other day alternating with 60mg  every other day.   venlafaxine XR (EFFEXOR-XR) 150 MG 24 hr capsule TAKE ONE CAPSULE BY MOUTH EVERY DAY   No facility-administered encounter medications on  file as of 04/23/2023.     Lab Results  Component Value Date   WBC 6.9 04/15/2023   HGB 13.6 04/15/2023   HCT 41.4 04/15/2023   PLT 129 (L) 04/15/2023   GLUCOSE 91 04/23/2023   CHOL 164 01/02/2023   TRIG 64.0 01/02/2023   HDL 83.70 01/02/2023   LDLDIRECT 53.3 10/14/2012   LDLCALC 68 01/02/2023   ALT 18 04/15/2023   AST 18 04/15/2023   NA 140 04/23/2023   K 5.2 No hemolysis seen (H) 04/23/2023   CL 104 04/23/2023   CREATININE 2.53 (H) 04/23/2023   BUN 40 (H) 04/23/2023   CO2 25 04/23/2023   TSH 0.51 04/23/2023   INR 1.2 11/21/2022   HGBA1C 4.8 01/02/2023    MR CARDIAC MORPHOLOGY W WO CONTRAST  Result Date: 04/02/2023 CLINICAL DATA:  Cardiomyopathy, evaluate for infiltrative disease EXAM: CARDIAC MRI TECHNIQUE: The patient was scanned on a 1.5 Tesla Siemens magnet. A dedicated cardiac coil was used. Functional imaging was done using Fiesta sequences. 2,3, and 4 chamber views were done to assess for RWMA's. Modified Simpson's rule using a short axis stack was used to calculate an ejection fraction on a dedicated work [employer], [employer]. The patient received 8 cc of Gadavist. After 10 minutes inversion recovery sequences were used to assess for infiltration and scar tissue. Velocity flow mapping performed in the ascending aorta and main pulmonary artery. CONTRAST:  8 cc  of Gadavist FINDINGS: 1. Normal left ventricular size, thickness. Mildly reduced systolic function (LVEF = 47%). There is global hypokinesis. There is no late gadolinium enhancement in the left ventricular myocardium. Native LV T1 value 1095 ms (normal <1000 ms). LV ECV value 35.6% (normal <30%). LVEDV:164 ml LVESV: 87 ml SV: 77 ml CO: 5.2 L/min Myocardial mass: 116g 2. Normal right ventricular size, thickness and systolic function (RVEF = 59%). There are no regional wall motion abnormalities. 3.  Mild left atrial dilation.  normal right atrial size. 4. Normal size of the aortic root, ascending aorta and pulmonary  artery. 5. Mild mitral regurgitation, no significant valvular abnormalities. 6.  Normal pericardium.  No pericardial effusion. IMPRESSION: 1.  Normal LV size, mildly reduced LV systolic function.  LVEF 47%. 2.  There is no LGE or scar. 3.  No evidence for myocardial infiltration. 4.  Mildly elevated ECV value. 5.  Normal RV size and function. 6.  Findings suggest non ischemic cardiomyopathy. Electronically Signed   By: Debbe Odea M.D.   On: 04/02/2023 17:13   MR CARDIAC VELOCITY FLOW MAP  Result Date: 04/02/2023 CLINICAL DATA:  Cardiomyopathy, evaluate for infiltrative disease EXAM: CARDIAC MRI TECHNIQUE: The patient was scanned on a 1.5 Tesla Siemens magnet. A dedicated cardiac coil was used. Functional imaging was done using Fiesta sequences. 2,3, and 4 chamber views were done to assess for RWMA's. Modified Simpson's rule using a short axis stack was used to calculate an ejection fraction on a dedicated work [employer] officer, [employer]. The patient received 8 cc of Gadavist. After 10 minutes inversion recovery sequences were used to assess for infiltration and scar tissue. Velocity flow mapping performed in the ascending aorta and main pulmonary artery. CONTRAST:  8 cc  of Gadavist FINDINGS: 1. Normal left ventricular size,  thickness. Mildly reduced systolic function (LVEF = 47%). There is global hypokinesis. There is no late gadolinium enhancement in the left ventricular myocardium. Native LV T1 value 1095 ms (normal <1000 ms). LV ECV value 35.6% (normal <30%). LVEDV:164 ml LVESV: 87 ml SV: 77 ml CO: 5.2 L/min Myocardial mass: 116g 2. Normal right ventricular size, thickness and systolic function (RVEF = 59%). There are no regional wall motion abnormalities. 3.  Mild left atrial dilation.  normal right atrial size. 4. Normal size of the aortic root, ascending aorta and pulmonary artery. 5. Mild mitral regurgitation, no significant valvular abnormalities. 6.  Normal pericardium.  No pericardial  effusion. IMPRESSION: 1.  Normal LV size, mildly reduced LV systolic function.  LVEF 47%. 2.  There is no LGE or scar. 3.  No evidence for myocardial infiltration. 4.  Mildly elevated ECV value. 5.  Normal RV size and function. 6.  Findings suggest non ischemic cardiomyopathy. Electronically Signed   By: Debbe Odea M.D.   On: 04/02/2023 17:13       Assessment & Plan:  Hypothyroidism, unspecified type Assessment & Plan: Significantly elevated tsh recently.   On amiodarone.  Continue daily synthroid.  Follow tsh.  Recheck tsh today to confirm wnl.   Orders: -     TSH  Hyperkalemia Assessment & Plan: Noted on recent check.  Recheck metabolic panel today.   Orders: -     Basic metabolic panel  Systolic congestive heart failure, unspecified HF chronicity (HCC) Assessment & Plan: Cardiac MRI in 8/24 showed improvement; LV EF 47%, no myocardial delayed enhancement, normal RV size, RV EF 59%, mild MR. Recommended continuing torsemide 60mg  alternating with 40mg . She reports she is taking 40mg  bid. Continue farxiga and hydralazine.  Added imdur 30mg  daily.  Coreg also increased to 12.5mg  bid. Referred to EP and renal artery dopplers ordered.  Doing well on current medication regimen.  No increased sob.  No evidence of volume overload on exam.  Recheck metabolic panel today.    CKD (chronic kidney disease), stage IV (HCC) Assessment & Plan: Avoid antiinflammatories.  Will need to monitor kidney function - with torsemide use. Recent LHC.  Recheck metabolic panel today. Keep f/u with nephrology.    Pulmonary emphysema, unspecified emphysema type (HCC) Assessment & Plan: No increased cough or congestion.  Continues toresemide. Follow.     Anemia, unspecified type Assessment & Plan: Hospitalized - 11/12/22 - 11/26/22 - admitted with symptomatic anemia.  Presented with increased sob.  LHC - normal coronaries.  She was unable to complete one unit pRBC transfusion due to decompensated heart  failure requiring lasix. EGD 11/23/22 - negative.  Colonoscopy 11/24/22 - poor prep.  No large lesions.  11/26/22 hgb - 8.7.  Saw Dr Smith Robert for f/u anemia.  Recommended feraheme. Continues to be followed by Dr Smith Robert - [facility].  Last evaluated 02/03/23 - hgb 12.8.     Depression, major, single episode, mild (HCC) Assessment & Plan: Continue effexor and wellbutrin. Has good support.  Follow. Stable.    Dilated cardiomyopathy [facility]) Assessment & Plan: Cath in 4/24 showed no significant coronary disease.  Cardiac MRI in 8/24 showed improvement; LV EF 47%, no myocardial delayed enhancement, normal RV size, RV EF 59%, mild MR. Recommended continuing torsemide 60mg  alternating with 40mg . She reports she is taking 40mg  bid. Continue farxiga and hydralazine.  Added imdur 30mg  daily.  Coreg also increased to 12.5mg  bid. Currently doing well on above regimen.    Encounter for screening colonoscopy Assessment & Plan:  Colonoscopy 12/2013.  Recommended f/u in 10 years.    Essential hypertension Assessment & Plan: Continue carvedilol, hydralazine, imdur and torsemide as directed.  Also on farxiga.  Follow pressures.  Follow metabolic panel.  Recheck today.    Gastroesophageal reflux disease without esophagitis Assessment & Plan: No upper symptoms reported.  On prilosec.    Hypercholesterolemia Assessment & Plan: Continue lipitor.  Low cholesterol diet and exercise.  Follow lipid panel and liver function tests.   Lab Results  Component Value Date   CHOL 164 01/02/2023   HDL 83.70 01/02/2023   LDLCALC 68 01/02/2023   LDLDIRECT 53.3 10/14/2012   TRIG 64.0 01/02/2023   CHOLHDL 2 01/02/2023      Paroxysmal atrial fibrillation (HCC) Assessment & Plan: On amiodarone.  Continue eliquis.  Appears to be in SR today.  Follow.       Dale Jacobus, MD

## 2023-04-28 ENCOUNTER — Encounter: Payer: Self-pay | Admitting: Internal Medicine

## 2023-04-28 NOTE — Assessment & Plan Note (Signed)
Cath in 4/24 showed no significant coronary disease.  Cardiac MRI in 8/24 showed improvement; LV EF 47%, no myocardial delayed enhancement, normal RV size, RV EF 59%, mild MR. Recommended continuing torsemide 60mg  alternating with 40mg . She reports she is taking 40mg  bid. Continue farxiga and hydralazine.  Added imdur 30mg  daily.  Coreg also increased to 12.5mg  bid. Currently doing well on above regimen.

## 2023-04-28 NOTE — Assessment & Plan Note (Signed)
Continue lipitor.  Low cholesterol diet and exercise.  Follow lipid panel and liver function tests.   Lab Results  Component Value Date   CHOL 164 01/02/2023   HDL 83.70 01/02/2023   LDLCALC 68 01/02/2023   LDLDIRECT 53.3 10/14/2012   TRIG 64.0 01/02/2023   CHOLHDL 2 01/02/2023

## 2023-04-28 NOTE — Assessment & Plan Note (Signed)
Cardiac MRI in 8/24 showed improvement; LV EF 47%, no myocardial delayed enhancement, normal RV size, RV EF 59%, mild MR. Recommended continuing torsemide 60mg  alternating with 40mg . She reports she is taking 40mg  bid. Continue farxiga and hydralazine.  Added imdur 30mg  daily.  Coreg also increased to 12.5mg  bid. Referred to EP and renal artery dopplers ordered.  Doing well on current medication regimen.  No increased sob.  No evidence of volume overload on exam.  Recheck metabolic panel today.

## 2023-04-28 NOTE — Assessment & Plan Note (Signed)
Continue carvedilol, hydralazine, imdur and torsemide as directed.  Also on farxiga.  Follow pressures.  Follow metabolic panel.  Recheck today.

## 2023-04-28 NOTE — Assessment & Plan Note (Signed)
Hospitalized - 11/12/22 - 11/26/22 - admitted with symptomatic anemia.  Presented with increased sob.  LHC - normal coronaries.  She was unable to complete one unit pRBC transfusion due to decompensated heart failure requiring lasix. EGD 11/23/22 - negative.  Colonoscopy 11/24/22 - poor prep.  No large lesions.  11/26/22 hgb - 8.7.  Saw Dr Smith Robert for f/u anemia.  Recommended feraheme. Continues to be followed by Dr Smith Robert - [facility].  Last evaluated 02/03/23 - hgb 12.8.

## 2023-04-28 NOTE — Assessment & Plan Note (Signed)
No increased cough or congestion.  Continues toresemide. Follow.   

## 2023-04-28 NOTE — Assessment & Plan Note (Signed)
Avoid antiinflammatories.  Will need to monitor kidney function - with torsemide use. Recent LHC.  Recheck metabolic panel today. Keep f/u with nephrology.

## 2023-04-28 NOTE — Assessment & Plan Note (Signed)
Noted on recent check.  Recheck metabolic panel today.

## 2023-04-28 NOTE — Assessment & Plan Note (Signed)
No upper symptoms reported. On prilosec.  

## 2023-04-28 NOTE — Assessment & Plan Note (Signed)
Colonoscopy 12/2013.  Recommended f/u in 10 years.

## 2023-04-28 NOTE — Assessment & Plan Note (Signed)
Significantly elevated tsh recently.   On amiodarone.  Continue daily synthroid.  Follow tsh.  Recheck tsh today to confirm wnl.

## 2023-04-28 NOTE — Assessment & Plan Note (Signed)
Continue effexor and wellbutrin. Has good support.  Follow. Stable.

## 2023-04-28 NOTE — Assessment & Plan Note (Signed)
On amiodarone.  Continue eliquis.  Appears to be in SR today.  Follow.

## 2023-05-06 ENCOUNTER — Inpatient Hospital Stay: Payer: Medicare Other | Attending: Oncology

## 2023-05-14 ENCOUNTER — Ambulatory Visit: Payer: Self-pay

## 2023-05-14 NOTE — Patient Instructions (Signed)
Visit Information  Thank you for taking time to visit with me today. Please don't hesitate to contact me if I can be of assistance to you.   Following are the goals we discussed today:   Goals Addressed             This Visit's Progress    Continued improvement post hospitalization and management/ education of health conditions.       Interventions Today    Flowsheet Row Most Recent Value  Chronic Disease   Chronic disease during today's visit Congestive Heart Failure (CHF), Chronic Kidney Disease/End Stage Renal Disease (ESRD), Hypertension (HTN), Other  [Anemia]  General Interventions   General Interventions Discussed/Reviewed Labs, General Interventions Reviewed, Doctor Visits  [evaluation of current treatment plan for mentioned health conditions and patients adherence to plan as established by provider. Assessed for HF and anemia symptoms and BP readings.]  Labs Kidney Function  Doctor Visits Discussed/Reviewed Doctor Visits Reviewed  [name] upcoming provider visit. Advised to keep follow up visits with providers.]  Exercise Interventions   Exercise Discussed/Reviewed Physical Activity  Physical Activity Discussed/Reviewed Physical Activity Reviewed  [name] current activity level.  Encouraged to continue exercise routine]  Education Interventions   Education Provided Provided Education  Provided Verbal Education On When to see the doctor  [Reviewed HF action plan and when to contact provider. Advised continued weight monitoring and recording]  Nutrition Interventions   Nutrition Discussed/Reviewed Decreasing salt, Nutrition Reviewed  Pharmacy Interventions   Pharmacy Dicussed/Reviewed Pharmacy Topics Reviewed  [medications reviewed and compliance discussed.  Discussed recent medication adjustments.]              Our next appointment is by telephone on 07/15/23 at 10 am  Please call the care guide team at [phone] if you need to cancel or reschedule your  appointment.   If you are experiencing a Mental Health or Behavioral Health Crisis or need someone to talk to, please call the Suicide and Crisis Lifeline: 988 call 1-800-273-TALK (toll free, 24 hour hotline)  Patient verbalizes understanding of instructions and care plan provided today and agrees to view in MyChart. Active MyChart status and patient understanding of how to access instructions and care plan via MyChart confirmed with patient.     [name] RN,BSN,CCM [facility] Care Coordination [phone] direct line

## 2023-05-14 NOTE — Patient Outreach (Signed)
Care Coordination   Follow Up Visit Note   05/14/2023 Name: Dawn Sherman MRN: [id number] DOB: 11-28-1955  Dawn Sherman is a 67 y.o. year old female who sees Dale Richgrove, MD for primary care. I spoke with  Dawn Sherman by phone today.  What matters to the patients health and wellness today?  Patient states she continues to do well.  Denies any heart failure or anemia symptoms.   Patient states her blood pressure ranges in the 130's/80's. She reports today's weight is 118 lbs.  Per chart review patient had follow up with primary care provider on 04/23/23 and nephrologist on 05/05/23.  Consult visit with cardiovascular scheduled for 05/28/23.  Patient reports completing a kidney disease education class on line as recommended by nephrologist and is scheduled for a kidney transplant evaluation 05/2023.     Goals Addressed             This Visit's Progress    Continued improvement post hospitalization and management/ education of health conditions.       Interventions Today    Flowsheet Row Most Recent Value  Chronic Disease   Chronic disease during today's visit Congestive Heart Failure (CHF), Chronic Kidney Disease/End Stage Renal Disease (ESRD), Hypertension (HTN), Other  [Anemia]  General Interventions   General Interventions Discussed/Reviewed Labs, General Interventions Reviewed, Doctor Visits  [evaluation of current treatment plan for mentioned health conditions and patients adherence to plan as established by provider. Assessed for HF and anemia symptoms and BP readings.]  Labs Kidney Function  Doctor Visits Discussed/Reviewed Doctor Visits Reviewed  Dawn Sherman upcoming provider visit. Advised to keep follow up visits with providers.]  Exercise Interventions   Exercise Discussed/Reviewed Physical Activity  Physical Activity Discussed/Reviewed Physical Activity Reviewed  Dawn Sherman current activity level.  Encouraged to continue exercise routine]  Education Interventions    Education Provided Provided Education  Provided Verbal Education On When to see the doctor  [Reviewed HF action plan and when to contact provider. Advised continued weight monitoring and recording]  Nutrition Interventions   Nutrition Discussed/Reviewed Decreasing salt, Nutrition Reviewed  Pharmacy Interventions   Pharmacy Dicussed/Reviewed Pharmacy Topics Reviewed  [medications reviewed and compliance discussed.  Discussed recent medication adjustments.]              SDOH assessments and interventions completed:  No     Care Coordination Interventions:  Yes, provided   Follow up plan: Follow up call scheduled for 07/15/23    Encounter Outcome:  Patient Visit Completed   [name] RN,BSN,CCM [facility] Care Coordination [phone] direct line

## 2023-05-16 ENCOUNTER — Other Ambulatory Visit: Payer: Self-pay | Admitting: Internal Medicine

## 2023-05-16 DIAGNOSIS — Z1231 Encounter for screening mammogram for malignant neoplasm of breast: Secondary | ICD-10-CM

## 2023-05-26 ENCOUNTER — Other Ambulatory Visit: Payer: Self-pay | Admitting: Internal Medicine

## 2023-05-28 ENCOUNTER — Institutional Professional Consult (permissible substitution): Payer: Medicare Other | Admitting: Cardiology

## 2023-06-08 NOTE — Progress Notes (Unsigned)
Electrophysiology Office Note:    Date:  06/09/2023   ID:  [name], DOB [birth year], MRN [id number]  [facility] Cardiologist:  Julien Nordmann, MD  [facility] Electrophysiologist:  Lanier Prude, MD   Referring MD: Laurey Morale, MD   Chief Complaint: AF  History of Present Illness:    Dawn Sherman is a 67 y.o. femalewho I am seeing today for an evaluation of AFat the request of Dr Shirlee Latch.  The patient was last seen by Dr Shirlee Latch on 04/15/2023.  The patient has a medical history that includes COPD, pAF, MR, HTN, HLD, CKD4, prior ICH in setting of fall (now back on OAC) and systolic heart failure. EF at diagnosis was 30-35%.  Admitted 08/2022 with decompensated HF. Admitted 10/2022 with anemia (Hgb 7.1). Echo showed E F25-30 with severe MR/TR.  03/2023 cardiac MRI showed EF 47% with mild MR.   Now maintaining sinus on amiodarone.  She is doing well today.  She reports being active.  Her breathing is stable.  No fluid retention.  She takes her medications without missed doses.  She has not missed any Eliquis.         Their past medical, social and family history was reveiwed.   ROS:   Please see the history of present illness.    All other systems reviewed and are negative.  EKGs/Labs/Other Studies Reviewed:    The following studies were reviewed today:  04/02/2023 cMR EF47 No LGE Normal RV Mild MR        Physical Exam:    VS:  BP (!) 140/60   Pulse 77   Ht 5\' 5"  (1.651 m)   Wt 123 lb (55.8 kg)   SpO2 97%   BMI 20.47 kg/m     Wt Readings from Last 3 Encounters:  06/09/23 123 lb (55.8 kg)  04/23/23 120 lb 6.4 oz (54.6 kg)  04/15/23 121 lb (54.9 kg)     GEN:  Well nourished, well developed in no acute distress CARDIAC: RRR, no murmurs, rubs, gallops RESPIRATORY:  Clear to auscultation without rales, wheezing or rhonchi       ASSESSMENT AND PLAN:    1. Chronic systolic heart failure (HCC)   2. Encounter for long-term (current)  use of high-risk medication   3. Persistent atrial fibrillation (HCC)     #Persistent AF #High risk med monitoring-amiodarone Symptomatic AF, associated with reduced EF (tachy mediated cardiomyopathy) Rhythm control indicated.  Discussed treatment options today including AAD, catheter ablation.   I do think she is a reasonable candidate for catheter ablation.  ----------  Discussed treatment options today for AF including antiarrhythmic drug therapy and ablation. Discussed risks, recovery and likelihood of success with each treatment strategy. Risk, benefits, and alternatives to EP study and ablation for afib were discussed. These risks include but are not limited to stroke, bleeding, vascular damage, tamponade, perforation, damage to the esophagus, lungs, phrenic nerve and other structures, pulmonary vein stenosis, worsening renal function, coronary vasospasm and death.  Discussed potential need for repeat ablation procedures and antiarrhythmic drugs after an initial ablation. The patient understands these risk and wishes to proceed.  We will therefore proceed with catheter ablation at the next available time.  Carto, ICE, anesthesia are requested for the procedure.    Plan for TEE on the table the day of ablation to assess LAA patency and measure for possible LAAO.  -------------  I have seen Dawn Sherman in the office today who is being  considered for a Watchman left atrial appendage closure device. I believe they will benefit from this procedure given their history of atrial fibrillation, CHA2DS2-VASc score of 5 and unadjusted ischemic stroke rate of 7.2% per year. Unfortunately, the patient is not felt to be a long term anticoagulation candidate secondary to history of anemia. The patient's chart has been reviewed and I feel that they would be a candidate for short term oral anticoagulation after Watchman implant.   It is my belief that after undergoing a LAA closure procedure, Dawn Sherman will not need long term anticoagulation which eliminates anticoagulation side effects and major bleeding risk.   Procedural risks for the Watchman implant have been reviewed with the patient including a 0.5% risk of stroke, <1% risk of perforation and <1% risk of device embolization. Other risks include bleeding, vascular damage, tamponade, worsening renal function, and death. The patient understands these risk and wishes to proceed.     The published clinical data on the safety and effectiveness of WATCHMAN include but are not limited to the following: - Holmes DR, Everlene Farrier, Sick P et al. for the PROTECT AF Investigators. Percutaneous closure of the left atrial appendage versus warfarin therapy for prevention of stroke in patients with atrial fibrillation: a randomised non-inferiority trial. Lancet 2009; 374: 534-42. Everlene Farrier, Doshi SK, Isa Rankin D et al. on behalf of the PROTECT AF Investigators. Percutaneous Left Atrial Appendage Closure for Stroke Prophylaxis in Patients With Atrial Fibrillation 2.3-Year Follow-up of the PROTECT AF (Watchman Left Atrial Appendage System for Embolic Protection in Patients With Atrial Fibrillation) Trial. Circulation 2013; 127:720-729. - Alli O, Doshi S,  Kar S, Reddy VY, Sievert H et al. Quality of Life Assessment in the Randomized PROTECT AF (Percutaneous Closure of the Left Atrial Appendage Versus Warfarin Therapy for Prevention of Stroke in Patients With Atrial Fibrillation) Trial of Patients at Risk for Stroke With Nonvalvular Atrial Fibrillation. J Am Coll Cardiol 2013; 61:1790-8. Aline August DR, Mia Creek, Price M, Whisenant B, Sievert H, Doshi S, Huber K, Reddy V. Prospective randomized evaluation of the Watchman left atrial appendage Device in patients with atrial fibrillation versus long-term warfarin therapy; the PREVAIL trial. Journal of the [employer] of Cardiology, Vol. 4, No. 1, 2014, 1-11. - Kar S, Doshi SK, Sadhu A, Horton R, Osorio J et  al. Primary outcome evaluation of a next-generation left atrial appendage closure device: results from the PINNACLE FLX trial. Circulation 2021;143(18)1754-1762.    After today's visit with the patient which was dedicated solely for shared decision making visit regarding LAA closure device, the patient decided to proceed with the LAA appendage closure procedure scheduled to be done in the near future at [facility].   HAS-BLED score 5 Hypertension Yes  Abnormal renal and liver function (Dialysis, transplant, Cr >2.26 mg/dL /Cirrhosis or Bilirubin >2x Normal or AST/ALT/AP >3x Normal) Yes  Stroke Yes  Bleeding Yes  Labile INR (Unstable/high INR) No  Elderly (>65) Yes  Drugs or alcohol (>= 8 drinks/week, anti-plt or NSAID) No   CHA2DS2-VASc Score = 5  The patient's score is based upon: CHF History: 1 HTN History: 1 Diabetes History: 0 Stroke History: 0 Vascular Disease History: 1 Age Score: 1 Gender Score: 1   #Chronic systolic heart failure NYHA III. Warm and dry on exam today. Management complicated by CKD4 hx. Rhythm control indicated as above. Continue current medical therpay.   Signed, Rossie Muskrat. Lalla Brothers, MD, [facility], [facility] 06/09/2023 9:47 AM  Electrophysiology [facility]

## 2023-06-09 ENCOUNTER — Encounter: Payer: Self-pay | Admitting: Cardiology

## 2023-06-09 ENCOUNTER — Ambulatory Visit: Payer: Medicare Other | Attending: Cardiology | Admitting: Cardiology

## 2023-06-09 VITALS — BP 140/60 | HR 77 | Ht 65.0 in | Wt 123.0 lb

## 2023-06-09 DIAGNOSIS — I5022 Chronic systolic (congestive) heart failure: Secondary | ICD-10-CM | POA: Insufficient documentation

## 2023-06-09 DIAGNOSIS — I4819 Other persistent atrial fibrillation: Secondary | ICD-10-CM | POA: Insufficient documentation

## 2023-06-09 DIAGNOSIS — Z79899 Other long term (current) drug therapy: Secondary | ICD-10-CM | POA: Insufficient documentation

## 2023-06-09 NOTE — Patient Instructions (Addendum)
Medication Instructions:  Your physician recommends that you continue on your current medications as directed. Please refer to the Current Medication list given to you today.  *If you need a refill on your cardiac medications before your next appointment, please call your pharmacy*  Lab Work: BMET and CBC prior to your procedure  Testing/Procedures: Your physician has recommended that you have an ablation. Catheter ablation is a medical procedure used to treat some cardiac arrhythmias (irregular heartbeats). During catheter ablation, a long, thin, flexible tube is put into a blood vessel in your groin (upper thigh), or neck. This tube is called an ablation catheter. It is then guided to your heart through the blood vessel. Radio frequency waves destroy small areas of heart tissue where abnormal heartbeats may cause an arrhythmia to start. You are scheduled for Atrial Fibrillation Ablation on Thursday, January 2 with Dr. Steffanie Dunn.Please arrive at the Main Entrance A at [facility]: [address] [place], [place] [address] at 5:30 AM    Follow-Up: At [facility], you and your health needs are our priority.  As part of our continuing mission to provide you with exceptional heart care, we have created designated Provider Care Teams.  These Care Teams include your primary Cardiologist (physician) and Advanced Practice Providers (APPs -  Physician Assistants and Nurse Practitioners) who all work together to provide you with the care you need, when you need it.  Your next appointment:   We will call you to arrange your follow up appointments.

## 2023-06-10 ENCOUNTER — Other Ambulatory Visit: Payer: Self-pay | Admitting: Cardiovascular Disease

## 2023-06-25 ENCOUNTER — Ambulatory Visit
Admission: RE | Admit: 2023-06-25 | Discharge: 2023-06-25 | Disposition: A | Discharge status: Home / Self Care | Payer: Medicare Other | Source: Ambulatory Visit | Attending: Internal Medicine | Admitting: Internal Medicine

## 2023-06-25 DIAGNOSIS — Z1231 Encounter for screening mammogram for malignant neoplasm of breast: Secondary | ICD-10-CM | POA: Diagnosis present

## 2023-07-13 NOTE — Progress Notes (Unsigned)
Date:  07/14/2023   ID:  Dawn Sherman, DOB 25-Oct-1955, MRN [id number]  Patient Location:  [address] Dawn Sherman [id number]   Provider location:   [facility], [place] office  PCP:  Dale , MD  Cardiologist:  Fonnie Mu   Chief Complaint  Patient presents with   6 month follow up     "Doing well." Medications reviewed by the patient verbally.     History of Present Illness:    Dawn Sherman is a 67 y.o. female  past medical history of smoker, COPD, quit smoking 1/24 prior alcohol,  chronic kidney disease,  hypertension,  hospital June 2021 with worsening PND orthopnea, congestive heart failure symptoms, " panic attack" Normal ejection fraction April 2023 EF down to 35% in 2/24 with CHF sx Who presents for follow-up of her diastolic and systolic CHF, PAF  Last seen by myself in clinic May 2024 Recently seen by Dr. Lalla Brothers June 09, 2023 Scheduled for  ablation 08/28/23 Then possible Watchman device given history of anemia  In general reports that she feels well Denies PND orthopnea, no leg swelling, no abdominal bloating, weight stable  Imaging reviewed Echo March 2024 EF 25 to 30% 03/2023 cardiac MRI showed EF 47% with mild MR.  Right and left heart catheterization April 2024 normal coronary arteries  EKG personally reviewed by myself on todays visit EKG Interpretation Date/Time:  Monday July 14 2023 09:28:49 EST Ventricular Rate:  66 PR Interval:  256 QRS Duration:  94 QT Interval:  392 QTC Calculation: 410 R Axis:   55  Text Interpretation: Sinus rhythm with 1st degree A-V block Minimal voltage criteria for LVH, may be normal variant ( Sokolow-Lyon ) ST & Marked T wave abnormality, consider anterolateral ischemia When compared with ECG of 15-Apr-2023 11:06, No significant change was found Confirmed by Julien Nordmann [id number]) on 07/14/2023 9:33:56 AM     Seen by advanced heart failure clinic December 05, 2022 Torsemide increased to 60 alternating with 40 Continue BiDil, Farxiga, increase Coreg Cardiac MRI ordered, has not been completed On amiodarone to maintain normal sinus rhythm  In follow-up today she reports that she feels well, denies significant shortness of breath on exertion Only gets short of breath climbing stairs  BP well controlled yesterday Elevated today, reports taking her medications " [name] is driving" forced her blood pressure up today  Denies lower extremity edema Denies cough, no PND orthopnea Does not feel that she has fluid in her lungs  On amiodarone 200 daily Eliquis 2.5 twice daily, reduced dose for weight and renal function Taking carvedilol 9.375 twice daily, Farxiga 10 daily BiDil 3 times daily torsemide 60 alternating with 40 Not on spironolactone secondary to history of hyperkalemia  Quit smoking February 2024  EKG personally reviewed by myself on todays visit Normal sinus rhythm rate 70 bpm LVH with strain pattern  Other past medical history reviewed Echocardiogram April 2023 ejection fraction 65 to 70% Echo July 01, 2022 EF 50 to 55%  hospital admission February 2024  acute on chronic diastolic CHF, respiratory failure, COPD Moderate right pleural effusion on CT Echo ejection fraction 35 to 40% severely dilated left and right atrium, moderate MR Thoracentesis completed Hemoglobin 9.7, down from 11 Went home from hospital 10/01/22   hospital June 2021 Echocardiogram confirming ejection fraction 30%, global Unable to exclude hypertensive cardiomyopath In the setting of paroxysmal atrial fibrillation  pleural effusion on CT scan  Prior records reviewed hyperkalemia with K as high as 6.9 on 08/04/20 and her [name] has had to be discontinued. ACE and ARB avoided   Past Medical History:  Diagnosis Date   B12 deficiency    Cardiomyopathy (HCC)    a. 09/2022 Echo: EF 35-40%; b. 09/2022 MV: apical defect w/ mild peri-infarct ischemia.  EF 43%.   Chronic HFrEF (heart failure with reduced ejection fraction) (HCC)    a. 01/2020 Echo: EF 30-35%; b. 07/2020 Echo: EF 50-55%; c. 06/2022 Echo: EF 50-55%; d. 09/2022 Echo: EF 35-40%, globh HK, mod reduced RV fxn, sev BAE, mod MR.   CKD (chronic kidney disease), stage IV (HCC)    COPD (chronic obstructive pulmonary disease) (HCC)    Depression    Endometriosis    Hypertension    Mixed hyperlipidemia    Moderate mitral regurgitation    Normocytic anemia    PAF (paroxysmal atrial fibrillation) (HCC)    a. CHA2DS2VASc = 4-->eliquis 2.5 bid.   Paroxymsal Atrial flutter (HCC)    Tobacco abuse    Past Surgical History:  Procedure Laterality Date   ABDOMINAL HYSTERECTOMY     BREAST BIOPSY Right 2015   neg-  FIBROADENOMA   CENTRAL LINE INSERTION  11/15/2022   Procedure: CENTRAL LINE INSERTION;  Surgeon: Yvonne Kendall, MD;  Location: [facility] INVASIVE CV LAB;  Service: Cardiovascular;;   COLONOSCOPY WITH PROPOFOL N/A 11/24/2022   Procedure: COLONOSCOPY WITH PROPOFOL;  Surgeon: Regis Bill, MD;  Location: [facility] ENDOSCOPY;  Service: Endoscopy;  Laterality: N/A;   COLONOSCOPY WITH PROPOFOL N/A 11/23/2022   Procedure: COLONOSCOPY WITH PROPOFOL;  Surgeon: Regis Bill, MD;  Location: [facility] ENDOSCOPY;  Service: Endoscopy;  Laterality: N/A;   ESOPHAGOGASTRODUODENOSCOPY (EGD) WITH PROPOFOL N/A 11/23/2022   Procedure: ESOPHAGOGASTRODUODENOSCOPY (EGD) WITH PROPOFOL;  Surgeon: Regis Bill, MD;  Location: [facility] ENDOSCOPY;  Service: Endoscopy;  Laterality: N/A;   RIGHT HEART CATH N/A 11/15/2022   Procedure: RIGHT HEART CATH;  Surgeon: Yvonne Kendall, MD;  Location: [facility] INVASIVE CV LAB;  Service: Cardiovascular;  Laterality: N/A;   RIGHT/LEFT HEART CATH AND CORONARY ANGIOGRAPHY N/A 11/25/2022   Procedure: RIGHT/LEFT HEART CATH AND CORONARY ANGIOGRAPHY;  Surgeon: Iran Ouch, MD;  Location: [facility] INVASIVE CV LAB;  Service: Cardiovascular;  Laterality: N/A;   TAH/RSO  1999    secondary to bleeding and endometriosis (Dr Haskel Khan)      Allergies:   Sulfate, Codeine sulfate, Benicar [olmesartan], Amoxicillin, Clindamycin/lincomycin, Entresto [sacubitril-valsartan], Morphine and codeine, and Penicillins   Social History   Tobacco Use   Smoking status: Former    Current packs/day: 0.25    Average packs/day: 0.3 packs/day for 40.0 years (10.0 ttl pk-yrs)    Types: Cigarettes   Smokeless tobacco: Never   Tobacco comments:    Patient quit smoking recently   Vaping Use   Vaping status: Never Used  Substance Use Topics   Alcohol use: Not Currently    Alcohol/week: 0.0 standard drinks of alcohol    Comment: occasional   Drug use: No     Current Outpatient Medications on File Prior to Visit  Medication Sig Dispense Refill   acetaminophen (TYLENOL) 650 MG CR tablet Take 650 mg by mouth every 8 (eight) hours as needed for pain or fever.     albuterol (VENTOLIN HFA) 108 (90 Base) MCG/ACT inhaler Inhale 2 puffs into the lungs every 6 (six) hours as needed for wheezing or shortness of breath. 8 g 2   amiodarone (PACERONE) 200 MG tablet Take 1  tablet (200 mg total) by mouth daily. 30 tablet 1   apixaban (ELIQUIS) 2.5 MG TABS tablet Take 1 tablet (2.5 mg total) by mouth 2 (two) times daily. 60 tablet 6   atorvastatin (LIPITOR) 40 MG tablet Take 1 tablet (40 mg total) by mouth daily. 90 tablet 1   buPROPion (WELLBUTRIN XL) 150 MG 24 hr tablet Take 1 tablet (150 mg total) by mouth daily. 90 tablet 1   calcium carbonate (OS-CAL - DOSED IN MG OF ELEMENTAL CALCIUM) 1250 (500 Ca) MG tablet Take 1 tablet (500 mg of elemental calcium total) by mouth 3 (three) times daily with meals. 60 tablet 1   carvedilol (COREG) 12.5 MG tablet Take 1 tablet (12.5 mg total) by mouth 2 (two) times daily. 90 tablet 3   FARXIGA 10 MG TABS tablet Take 10 mg by mouth daily.     hydrALAZINE (APRESOLINE) 25 MG tablet Take 1 tablet (25 mg total) by mouth 3 (three) times daily. 90 tablet 5    isosorbide mononitrate (IMDUR) 30 MG 24 hr tablet Take 1 tablet (30 mg total) by mouth daily. 90 tablet 3   levothyroxine (SYNTHROID) 137 MCG tablet TAKE ONE TABLET BY MOUTH EVERY DAY 90 tablet 1   loratadine (CLARITIN) 10 MG tablet Take 10 mg by mouth daily.     omeprazole (PRILOSEC) 20 MG capsule Take 20 mg by mouth daily.     Tiotropium Bromide-Olodaterol 2.5-2.5 MCG/ACT AERS Inhale 2 puffs into the lungs daily. 4 g 3   torsemide (DEMADEX) 20 MG tablet torsemide 60 mg alternating with 40 mg every other day 270 tablet 0   venlafaxine XR (EFFEXOR-XR) 150 MG 24 hr capsule TAKE ONE CAPSULE BY MOUTH EVERY DAY 90 capsule 1   LOKELMA 10 g PACK packet Take 1 packet by mouth daily. (Patient not taking: Reported on 07/14/2023)     No current facility-administered medications on file prior to visit.     Family Hx: The patient's family history includes Fibromyalgia in her daughter; Hypercholesterolemia in her mother; Rheum arthritis in her daughter and father. There is no history of Breast cancer or Colon cancer.  ROS:   Please see the history of present illness.    Review of Systems  Constitutional: Negative.   HENT: Negative.    Respiratory: Negative.    Cardiovascular: Negative.   Gastrointestinal: Negative.   Musculoskeletal: Negative.   Neurological: Negative.   Psychiatric/Behavioral: Negative.    All other systems reviewed and are negative.    Labs/Other Tests and Data Reviewed:    Recent Labs: 11/20/2022: Magnesium 2.1 02/04/2023: B Natriuretic Peptide 1,023.0 04/15/2023: ALT 18; Hemoglobin 13.6; Platelets 129 04/23/2023: BUN 40; Creatinine, Ser 2.53; Potassium 5.2 No hemolysis seen; Sodium 140; TSH 0.51   Recent Lipid Panel Lab Results  Component Value Date/Time   CHOL 164 01/02/2023 07:31 AM   TRIG 64.0 01/02/2023 07:31 AM   HDL 83.70 01/02/2023 07:31 AM   CHOLHDL 2 01/02/2023 07:31 AM   LDLCALC 68 01/02/2023 07:31 AM   LDLDIRECT 53.3 10/14/2012 08:21 AM    Wt Readings  from Last 3 Encounters:  07/14/23 122 lb 2 oz (55.4 kg)  06/09/23 123 lb (55.8 kg)  04/23/23 120 lb 6.4 oz (54.6 kg)     Exam:    Vital Signs: Vital signs may also be detailed in the HPI BP (!) 140/82 (BP Location: Left Arm, Patient Position: Sitting, Cuff Size: Normal)   Pulse 66   Ht 5\' 5"  (1.651 m)   Wt 122  lb 2 oz (55.4 kg)   SpO2 94%   BMI 20.32 kg/m   Constitutional:  oriented to person, place, and time. No distress.  HENT:  Head: Grossly normal Eyes:  no discharge. No scleral icterus.  Neck: No JVD, no carotid bruits  Cardiovascular: Regular rate and rhythm, no murmurs appreciated Pulmonary/Chest: Clear to auscultation bilaterally, no wheezes or rails Abdominal: Soft.  no distension.  no tenderness.  Musculoskeletal: Normal range of motion Neurological:  normal muscle tone. Coordination normal. No atrophy Skin: Skin warm and dry Psychiatric: normal affect, pleasant  ASSESSMENT & PLAN:    Problem List Items Addressed This Visit       Cardiology Problems   Essential hypertension   Relevant Orders   EKG 12-Lead (Completed)   Persistent atrial fibrillation (HCC)   Relevant Orders   EKG 12-Lead (Completed)   Mitral valve insufficiency   Relevant Orders   EKG 12-Lead (Completed)     Other   Chronic obstructive pulmonary disease (HCC)   Tobacco use   Dyspnea   Iron deficiency anemia   Relevant Orders   EKG 12-Lead (Completed)   Other Visit Diagnoses     Chronic systolic heart failure (HCC)    -  Primary   Relevant Orders   EKG 12-Lead (Completed)   Paroxysmal A-fib (HCC)       Relevant Orders   EKG 12-Lead (Completed)   Chronic renal impairment, stage 3b (HCC)       Relevant Orders   EKG 12-Lead (Completed)      Dilated cardiomyopathy, nonischemic Ejection fraction normalized on echocardiogram April 2023 Drop in ejection fractionFebruary 2024 EF 35% Cardiac catheterization with no significant coronary disease Mild improvement on ejection fraction  on cardiac MRI Followed by advanced heart failure clinic No changes made to her medications Recommend she continue carvedilol 12.5 twice daily, Farxiga 10 daily, isosorbide mononitrate 30 daily, hydralazine 25 3 times daily, torsemide 60 alternating with 40  Paroxysmal atrial fibrillation Maintaining normal sinus rhythm Followed by EP, plan for ablation early January 2025  continue carvedilol, Eliquis, amiodarone  Chronic kidney disease Creatinine 2.83 BUN 37 On torsemide 60 alternating with 40 Reports that she is scheduled to meet with renal transplant team at [facility]  Hyperlipidemia Continue tor daily    Signed, Julien Nordmann, MD  [facility] [address], [place], [place] [address]

## 2023-07-14 ENCOUNTER — Ambulatory Visit: Payer: Medicare Other | Attending: Cardiovascular Disease | Admitting: Cardiovascular Disease

## 2023-07-14 ENCOUNTER — Encounter: Payer: Self-pay | Admitting: Cardiovascular Disease

## 2023-07-14 VITALS — BP 140/82 | HR 66 | Ht 65.0 in | Wt 122.1 lb

## 2023-07-14 DIAGNOSIS — I10 Essential (primary) hypertension: Secondary | ICD-10-CM | POA: Insufficient documentation

## 2023-07-14 DIAGNOSIS — I5022 Chronic systolic (congestive) heart failure: Secondary | ICD-10-CM | POA: Diagnosis not present

## 2023-07-14 DIAGNOSIS — I480 Paroxysmal atrial fibrillation: Secondary | ICD-10-CM | POA: Insufficient documentation

## 2023-07-14 DIAGNOSIS — I340 Nonrheumatic mitral (valve) insufficiency: Secondary | ICD-10-CM | POA: Insufficient documentation

## 2023-07-14 DIAGNOSIS — D509 Iron deficiency anemia, unspecified: Secondary | ICD-10-CM | POA: Diagnosis present

## 2023-07-14 DIAGNOSIS — Z720 Tobacco use: Secondary | ICD-10-CM | POA: Insufficient documentation

## 2023-07-14 DIAGNOSIS — N1832 Chronic kidney disease, stage 3b: Secondary | ICD-10-CM | POA: Diagnosis present

## 2023-07-14 DIAGNOSIS — R0609 Other forms of dyspnea: Secondary | ICD-10-CM | POA: Diagnosis present

## 2023-07-14 DIAGNOSIS — J432 Centrilobular emphysema: Secondary | ICD-10-CM | POA: Insufficient documentation

## 2023-07-14 DIAGNOSIS — I4819 Other persistent atrial fibrillation: Secondary | ICD-10-CM | POA: Insufficient documentation

## 2023-07-14 MED ORDER — TORSEMIDE 20 MG PO TABS: ORAL_TABLET | ORAL | 3 refills | Status: DC

## 2023-07-14 NOTE — Addendum Note (Signed)
Addended by: Jani Gravel on: 07/14/2023 09:51 AM   Modules accepted: Orders

## 2023-07-14 NOTE — Patient Instructions (Addendum)
Medication Instructions:  No changes  If you need a refill on your cardiac medications before your next appointment, please call your pharmacy.   Lab work: No new labs needed  Testing/Procedures: No new testing  Follow-Up: At [facility], you and your health needs are our priority.  As part of our continuing mission to provide you with exceptional heart care, we have created designated Provider Care Teams.  These Care Teams include your primary Cardiologist (physician) and Advanced Practice Providers (APPs -  Physician Assistants and Nurse Practitioners) who all work together to provide you with the care you need, when you need it.  You will need a follow up appointment in 12 months  Providers on your designated Care Team:   Nicolasa Ducking, NP Eula Listen, PA-C Cadence Fransico Michael, [place]  COVID-19 Vaccine Information can be found at: [email or web address] For questions related to vaccine distribution or appointments, please email [email or web address] or call 5056800496.

## 2023-07-15 ENCOUNTER — Ambulatory Visit: Payer: Self-pay

## 2023-07-15 NOTE — Patient Outreach (Signed)
  Care Coordination   Follow Up Visit Note   07/15/2023 Name: Dawn Sherman MRN: [id number] DOB: 1956/08/01  Dawn Sherman is a 67 y.o. year old female who sees Dale Wagner, MD for primary care. I spoke with  Dawn Sherman by phone today.  What matters to the patients health and wellness today?  Patient states she is doing well.  She reports having recent follow up visit with cardiologist.  She states she is scheduled for a follow up visit at the HF clinic on 07/21/23.  Patient states she is scheduled for an ablation on 08/28/23 and watchman procedure 2-3 weeks after ablation. Patient states she is currently in sinus rhythm.   Patient states her BP ranges from 120's/70-80's  and today's weight was is 120 lbs.    Goals Addressed             This Visit's Progress    Continued improvement post hospitalization and management/ education of health conditions.       Interventions Today    Flowsheet Row Most Recent Value  Chronic Disease   Chronic disease during today's visit Congestive Heart Failure (CHF), Hypertension (HTN), Atrial Fibrillation (AFib)  General Interventions   General Interventions Discussed/Reviewed General Interventions Reviewed, Doctor Visits  [evaluation of current treatment plan for HF, HTN, atrial fibrillation and patients adherence to plan as established by provider. Assessed for HF/ Atrial fibrillation symptoms and BP readings.]  Doctor Visits Discussed/Reviewed Doctor Visits Reviewed  Education Interventions   Education Provided Provided Education  [Reviewed signs/ symptoms of HF/ atrial fibrillations. Advised to continue monitoring weight and BP daily and recording.]  Provided Verbal Education On When to see the doctor  Pharmacy Interventions   Pharmacy Dicussed/Reviewed Pharmacy Topics Reviewed  [medications reviewed.  Advised to take medications as prescribed.]              SDOH assessments and interventions completed:  No     Care Coordination  Interventions:  Yes, provided   Follow up plan: Follow up call scheduled for 09/19/23    Encounter Outcome:  Patient Visit Completed   [name] RN,BSN,CCM [facility]  [facility], [facility] coordinator / Case Manager Phone: [phone]

## 2023-07-15 NOTE — Patient Instructions (Signed)
Visit Information  Thank you for taking time to visit with me today. Please don't hesitate to contact me if I can be of assistance to you.   Following are the goals we discussed today:   Goals Addressed             This Visit's Progress    Continued improvement post hospitalization and management/ education of health conditions.       Interventions Today    Flowsheet Row Most Recent Value  Chronic Disease   Chronic disease during today's visit Congestive Heart Failure (CHF), Hypertension (HTN), Atrial Fibrillation (AFib)  General Interventions   General Interventions Discussed/Reviewed General Interventions Reviewed, Doctor Visits  [evaluation of current treatment plan for HF, HTN, atrial fibrillation and patients adherence to plan as established by provider. Assessed for HF/ Atrial fibrillation symptoms and BP readings.]  Doctor Visits Discussed/Reviewed Doctor Visits Reviewed  Education Interventions   Education Provided Provided Education  [Reviewed signs/ symptoms of HF/ atrial fibrillations. Advised to continue monitoring weight and BP daily and recording.]  Provided Verbal Education On When to see the doctor  Pharmacy Interventions   Pharmacy Dicussed/Reviewed Pharmacy Topics Reviewed  [medications reviewed.  Advised to take medications as prescribed.]              Our next appointment is by telephone on 09/19/23 at 2 pm  Please call the care guide team at [phone] if you need to cancel or reschedule your appointment.   If you are experiencing a Mental Health or Behavioral Health Crisis or need someone to talk to, please call the Suicide and Crisis Lifeline: 988 call 1-800-273-TALK (toll free, 24 hour hotline)  Patient verbalizes understanding of instructions and care plan provided today and agrees to view in MyChart. Active MyChart status and patient understanding of how to access instructions and care plan via MyChart confirmed with patient.     [name]  RN,BSN,CCM [place]  [facility], [facility] coordinator / Case Manager Phone: [phone]

## 2023-07-22 ENCOUNTER — Encounter: Payer: Medicare Other | Admitting: Cardiology

## 2023-07-22 ENCOUNTER — Ambulatory Visit: Payer: Medicare Other | Attending: Cardiology | Admitting: Cardiology

## 2023-07-22 ENCOUNTER — Other Ambulatory Visit (HOSPITAL_COMMUNITY): Payer: Self-pay

## 2023-07-22 VITALS — BP 143/81 | HR 76 | Wt 121.5 lb

## 2023-07-22 DIAGNOSIS — I5022 Chronic systolic (congestive) heart failure: Secondary | ICD-10-CM

## 2023-07-22 MED ORDER — CARVEDILOL 12.5 MG PO TABS: 18.7500 mg | ORAL_TABLET | Freq: Two times a day (BID) | ORAL | 3 refills | Status: DC

## 2023-07-22 NOTE — Progress Notes (Signed)
Upon review of meds pt states she is no longer taking Lokelma due to the cost, she states it was $200 when she went to pick it up last week and was unable to get it. Has been out for about 1 week.

## 2023-07-22 NOTE — Progress Notes (Signed)
PCP: Dale Delway, MD HF Cardiology: Dr. Gala Romney  Dawn Sherman is a 67 y.o. female with a history of COPD, prior tobacco abuse, paroxysmal atrial fibrillation and flutter, mitral regurgitation, hypertension, hyperlipidemia, stage IV chronic kidney disease, and systolic HF.     HF diagnosed in 6/21. Echo EF 30-35% in setting of new-onset AF.  Repeat echo in December 2021 showed improvement in EF to 50-55% with grade 1 diastolic dysfunction.  In April 2023, in the context of GI illness and weakness, she suffered a fall was found to have a subarachnoid hemorrhage.  Oral anticoagulation was initially held but then subsequently resumed once cleared by neurosurgery.     She required admissions in July 2023 and again in November 2023 in the setting of respiratory failure and volume overload.  Echo November 2023 showed an EF of 50 to 55%.  Unfortunately, she was readmitted in January 2024 with heart failure and pneumonia.  Repeat echo 09/28/22, which was performed during an episode of atrial flutter, showed an EF of 35 to 40%.  Following diuresis, she was discharged home.  Stress test performed in the outpatient setting was intermediate in the setting of an EF of 43% with apical infarct and mild peri-infarct ischemia.  No cath at that time due to CKD 4.    Admitted 11/14/22 with progressive weight gain and SOB. Labs showed acute on chronic normocytic anemia with an H&H of 7.1 and 23.9.  BNP was elevated at 3619.9.  Creatinine stable at 2.76 however, potassium was elevated at 5.5. CXR with vascular congestion and small bilateral pleural effusions.  ECG with NSR. Hstrop 9 and 10.  Echo 11/14/22 with EF 25-30% with global Hk, RV moderately down. Severe MR, mod-severe TR.  LHC/RHC showed normal coronaries, normal filling pressures, and preserved CO.   Cardiac MRI in 8/24 with LV EF 47%, no myocardial delayed enhancement, normal RV size, RV EF 59%, mild MR (improved).    She saw EP and is planned for atrial  fibrillation ablation and apparently also for a Watchman placement.   Patient returns today for followup of CHF.  Weight is stable.  She is in NSR, no palpitations. No exertional dyspnea or chest pain, overall feels quite good.  Only problem recently has been that she has run out of [facility] and has found it to be too expensive.  She continues to follow with Dr. Thedore Mins for nephrology.     Labs (4/24): TSH 165, K 5.2, creatinine 2.87 Labs (6/24): K 4.2, creatinine 2.44, BNP 1023, myeloma panel negative, hgb 12.7 Labs (7/24): TSH 13 (coming down) Labs (11/24): K 5.1, creatinine 2.83, hgb 14.4  ECG (personally reviewed): NSR, LVH with repolarization abnormality.   PMH: 1. Atrial fibrillation: Paroxysmal 2. COPD: Prior smoker.  3. HTN 4. Hyperlipidemia 5. CKD stage 4 6. Subarachnoid hemorrhage: 4/23, traumatic after fall.  7. Chronic systolic CHF: Nonischemic cardiomyopathy.  - Echo (6/21): EF 30-35% in setting of AF/RVR. - Echo (12/21): EF 50-55% - Echo (11/23): EF 50-55%, normal RV, mild AS - Echo (3/24): EF 25-30%, moderate RV dysfunction, severe MR, moderate-severe TR.  - LHC/RHC (4/24): Normal coronaries; mean RA 6, PA 50/17 mean 30, mean PCWP 16, CI 3.7.  - Cardiac MRI (8/24): LV EF 47%, no myocardial delayed enhancement, normal RV size, RV EF 59%, mild MR (improved).  8. Anemia: Negative endoscopies 9. Hypothyroidism  Social History   Socioeconomic History   Marital status: Married    Spouse name: Not on file   Number of  children: Not on file   Years of education: Not on file   Highest education level: Not on file  Occupational History   Not on file  Tobacco Use   Smoking status: Former    Current packs/day: 0.25    Average packs/day: 0.3 packs/day for 40.0 years (10.0 ttl pk-yrs)    Types: Cigarettes   Smokeless tobacco: Never   Tobacco comments:    Patient quit smoking recently   Vaping Use   Vaping status: Never Used  Substance and Sexual Activity   Alcohol use:  Not Currently    Alcohol/week: 0.0 standard drinks of alcohol    Comment: occasional   Drug use: No   Sexual activity: Not Currently  Other Topics Concern   Not on file  Social History Narrative   Lives in [place] with her husband.  She is retired from [employer]	[place].  She does not routinely exercise.   Social Determinants of Health   Financial Resource Strain: Low Risk  (12/12/2022)   Overall Financial Resource Strain (CARDIA)    Difficulty of Paying Living Expenses: Not hard at all  Food Insecurity: No Food Insecurity (12/12/2022)   Hunger Vital Sign    Worried About Running Out of Food in the Last Year: Never true    Ran Out of Food in the Last Year: Never true  Transportation Needs: No Transportation Needs (12/12/2022)   PRAPARE - Administrator, Civil Service (Medical): No    Lack of Transportation (Non-Medical): No  Physical Activity: Sufficiently Active (12/12/2022)   Exercise Vital Sign    Days of Exercise per Week: 7 days    Minutes of Exercise per Session: 30 min  Stress: No Stress Concern Present (12/12/2022)   [employer] of Occupational Health - Occupational Stress Questionnaire    Feeling of Stress : Not at all  Social Connections: Unknown (12/12/2022)   Social Connection and Isolation Panel [NHANES]    Frequency of Communication with Friends and Family: Not on file    Frequency of Social Gatherings with Friends and Family: Not on file    Attends Religious Services: Not on file    Active Member of Clubs or Organizations: Not on file    Attends Banker Meetings: Not on file    Marital Status: Married  Intimate Partner Violence: Not At Risk (12/12/2022)   Humiliation, Afraid, Rape, and Kick questionnaire    Fear of Current or Ex-Partner: No    Emotionally Abused: No    Physically Abused: No    Sexually Abused: No   Family History  Problem Relation Age of Onset   Hypercholesterolemia Mother    Rheum arthritis Father    Rheum  arthritis Daughter    Fibromyalgia Daughter    Breast cancer Neg Hx    Colon cancer Neg Hx    ROS: All systems reviewed and negative except as per HPI.   Current Outpatient Medications  Medication Sig Dispense Refill   acetaminophen (TYLENOL) 650 MG CR tablet Take 650 mg by mouth every 8 (eight) hours as needed for pain or fever.     amiodarone (PACERONE) 200 MG tablet Take 1 tablet (200 mg total) by mouth daily. 30 tablet 1   apixaban (ELIQUIS) 2.5 MG TABS tablet Take 1 tablet (2.5 mg total) by mouth 2 (two) times daily. 60 tablet 6   atorvastatin (LIPITOR) 40 MG tablet Take 1 tablet (40 mg total) by mouth daily. 90 tablet 1   buPROPion [facility]  XL) 150 MG 24 hr tablet Take 1 tablet (150 mg total) by mouth daily. 90 tablet 1   calcium carbonate (OS-CAL - DOSED IN MG OF ELEMENTAL CALCIUM) 1250 (500 Ca) MG tablet Take 1 tablet (500 mg of elemental calcium total) by mouth 3 (three) times daily with meals. 60 tablet 1   FARXIGA 10 MG TABS tablet Take 10 mg by mouth daily.     hydrALAZINE (APRESOLINE) 25 MG tablet Take 1 tablet (25 mg total) by mouth 3 (three) times daily. 90 tablet 5   isosorbide mononitrate (IMDUR) 30 MG 24 hr tablet Take 1 tablet (30 mg total) by mouth daily. 90 tablet 3   levothyroxine (SYNTHROID) 137 MCG tablet TAKE ONE TABLET BY MOUTH EVERY DAY 90 tablet 1   loratadine (CLARITIN) 10 MG tablet Take 10 mg by mouth daily.     omeprazole (PRILOSEC) 20 MG capsule Take 20 mg by mouth daily.     Tiotropium Bromide-Olodaterol 2.5-2.5 MCG/ACT AERS Inhale 2 puffs into the lungs daily. 4 g 3   torsemide (DEMADEX) 20 MG tablet torsemide 60 mg alternating with 40 mg every other day 270 tablet 3   venlafaxine XR (EFFEXOR-XR) 150 MG 24 hr capsule TAKE ONE CAPSULE BY MOUTH EVERY DAY 90 capsule 1   carvedilol (COREG) 12.5 MG tablet Take 1.5 tablets (18.75 mg total) by mouth 2 (two) times daily. 90 tablet 3   No current facility-administered medications for this visit.   BP (!) 143/81    Pulse 76   Wt 121 lb 8 oz (55.1 kg)   SpO2 97%   BMI 20.22 kg/m  General: NAD Neck: No JVD, no thyromegaly or thyroid nodule.  Lungs: Distant BS CV: Nondisplaced PMI.  Heart regular S1/S2, no S3/S4, no murmur.  No peripheral edema.  No carotid bruit.  Normal pedal pulses.  Abdomen: Soft, nontender, no hepatosplenomegaly, no distention.  Skin: Intact without lesions or rashes.  Neurologic: Alert and oriented x 3.  Psych: Normal affect. Extremities: No clubbing or cyanosis.  HEENT: Normal.   Assessment/Plan: 1. Chronic systolic CHF: Nonischemic cardiomyopathy.  EF has fluctuated with atrial fibrillation in the past, but echo in 3/24 showed EF 25-30%, moderate RV dysfunction, severe MR, moderate-severe TR in the setting of NSR.  Cath in 4/24 showed no significant coronary disease.  Cardiac MRI in 8/24 showed improvement; LV EF 47%, no myocardial delayed enhancement, normal RV size, RV EF 59%, mild MR. Cause of cardiomyopathy is uncertain, unlikely to be tachycardia-mediated cardiomyopathy as she appears to have been staying in NSR on amiodarone. TSH was markedly high until Levoxyl started, hypothyroidism may play a role.  No h/o ETOH/drugs, no strong family history of cardiomyopathy.  NYHA class I-II, not volume overloaded on exam.  - Continue torsemide 60 mg daily alternating with 40 mg daily.  BMET/BNP today.   - Continue Farxiga 10 mg daily.  - Continue hydralazine 25 mg tid and Imdur 30 mg daily.  - Increase Coreg to 18.75 mg bid.  - No ARB/ACEI/ARNI/spironolactone with CKD stage IV.  - Repeat echo in 3/25.  2. CKD stage IV: Follow BMET carefully, check today.  3. Atrial fibrillation: Paroxysmal.  She appears to be holding NSR on amiodarone.  - Continue amiodarone 200 mg daily.  Check LFTs/TSH.  She is on treatment for hypothyroidism.  Needs regular eye exam.  - She has seen EP and will be getting atrial fibrillation ablation, hopefully this would allow [place] to stop amiodarone safely.   - Apparently she  also will be getting a Watchman.  Will continue Eliquis for now, dosed 2.5 mg bid for creatinine > 1.5 and wt < 60 kg.  4. COPD: She has quit smoking, I congratulated her.  5. Hypothyroidism: ?Contribution to CHF.  TSH significantly improved to 13, was very high before.  ?If hypothyroidism played a role in cardiomyopathy as EF has also improved.    - Continue Levothyroxine directed by PCP.  6. Anemia: Endoscopies negative in 3/24.   Last hgb was 14.4.  7. HTN: Associated with right renal atrophy on prior renal US.  - I ordered renal artery dopplers to assess for renal artery stenosis, not yet completed.  8. Valvular heart disease: Severe MR and mod-severe TR on 3/24 echo. Murmur not prominent on exam. Cardiac MRI showed only mild mitral regurgitation in setting of improved LV and RV function.  She does not have a prominent murmur.  - Continue to follow, will repeat echo in 3/25 to reassess.  Followup 3 months.    Dawn Sherman 07/22/2023

## 2023-07-22 NOTE — Patient Instructions (Signed)
Medication Changes:  Increase Carvedilol to 18.75 mg (1 & 1/2 tabs) Twice daily   Lab Work:  Labs done today, your results will be available in MyChart, we will contact you for abnormal readings.   Testing/Procedures:  Your physician has requested that you have an echocardiogram. Echocardiography is a painless test that uses sound waves to create images of your heart. It provides your doctor with information about the size and shape of your heart and how well your heart's chambers and valves are working. This procedure takes approximately one hour. There are no restrictions for this procedure. Please do NOT wear cologne, perfume, aftershave, or lotions (deodorant is allowed). Please arrive 15 minutes prior to your appointment time. IN 3 MONTHS  Please note: We ask at that you not bring children with you during ultrasound (echo/ vascular) testing. Due to room size and safety concerns, children are not allowed in the ultrasound rooms during exams. Our front office staff cannot provide observation of children in our lobby area while testing is being conducted. An adult accompanying a patient to their appointment will only be allowed in the ultrasound room at the discretion of the ultrasound technician under special circumstances. We apologize for any inconvenience.   Referrals:  NONE  Special Instructions // Education:  Do the following things EVERYDAY: Weigh yourself in the morning before breakfast. Write it down and keep it in a log. Take your medicines as prescribed Eat low salt foods--Limit salt (sodium) to 2000 mg per day.  Stay as active as you can everyday Limit all fluids for the day to less than 2 liters   Follow-Up in: 3 months with an echocardiogram (March 2025), we will call you closer to that time to schedule    If you have any questions or concerns before your next appointment please send [place] a message through [place] or call our office at [phone] Monday-Friday 8  am-5 pm.   If you have an urgent need after hours on the weekend please call your Primary Cardiologist or the [facility] in [place] at [phone].   At the [facility], you and your health needs are our priority. We have a designated team specialized in the treatment of Heart Failure. This Care Team includes your primary Heart Failure Specialized Cardiologist (physician), Advanced Practice Providers (APPs- Physician Assistants and Nurse Practitioners), and Pharmacist who all work together to provide you with the care you need, when you need it.   You may see any of the following providers on your designated Care Team at your next follow up:  Dr. Arvilla Meres Dr. Marca Ancona Dr. Dorthula Nettles Dr. Theresia Bough Tonye Becket, NP Robbie Lis, [place] [address] [place], [place] Brynda Peon, NP [place] Lee, NP Clarisa Kindred, NP Enos Fling, PharmD

## 2023-07-25 LAB — COMPREHENSIVE METABOLIC PANEL
ALT: 14 [IU]/L (ref 0–32)
AST: 24 [IU]/L (ref 0–40)
Albumin: 4.6 g/dL (ref 3.9–4.9)
Alkaline Phosphatase: 122 [IU]/L — ABNORMAL HIGH (ref 44–121)
BUN/Creatinine Ratio: 15 (ref 12–28)
BUN: 49 mg/dL — ABNORMAL HIGH (ref 8–27)
Bilirubin Total: <0.2 mg/dL (ref 0.0–1.2)
CO2: 21 mmol/L (ref 20–29)
Calcium: 9.2 mg/dL (ref 8.7–10.3)
Chloride: 100 mmol/L (ref 96–106)
Creatinine, Ser: 3.29 mg/dL — ABNORMAL HIGH (ref 0.57–1.00)
Globulin, Total: 2.2 g/dL (ref 1.5–4.5)
Glucose: 91 mg/dL (ref 70–99)
Potassium: 4.8 mmol/L (ref 3.5–5.2)
Sodium: 139 mmol/L (ref 134–144)
Total Protein: 6.8 g/dL (ref 6.0–8.5)
eGFR: 15 mL/min/{1.73_m2} — ABNORMAL LOW (ref >59)

## 2023-07-25 LAB — BRAIN NATRIURETIC PEPTIDE: BNP: 86.6 pg/mL (ref 0.0–100.0)

## 2023-07-25 LAB — TSH: TSH: 0.506 u[IU]/mL (ref 0.450–4.500)

## 2023-07-28 ENCOUNTER — Telehealth: Payer: Self-pay

## 2023-07-28 NOTE — Telephone Encounter (Signed)
Mail out pt assistance application for Farxiga 10 mg for [place]& [place].

## 2023-07-31 ENCOUNTER — Other Ambulatory Visit: Payer: Self-pay | Admitting: Cardiovascular Disease

## 2023-07-31 ENCOUNTER — Other Ambulatory Visit (HOSPITAL_COMMUNITY): Payer: Self-pay

## 2023-08-01 NOTE — Pre-Procedure Instructions (Signed)
Attempted to call patient regarding procedure instructions.  Left voicemail on the following items: Arrival time 0515 Nothing to eat or drink after midnight No meds AM of procedure Responsible person to drive you home and stay with you for 24 hrs  Have you missed any doses of anti-coagulant Eliquis- should be taken twice a day, if you have missed any doses please let know.  Don't take dose on Monday morning.

## 2023-08-03 NOTE — Anesthesia Preprocedure Evaluation (Addendum)
Anesthesia Evaluation  Patient identified by MRN, date of birth, ID band Patient awake    Reviewed: Allergy & Precautions, NPO status , Patient's Chart, lab work & pertinent test results  Airway Mallampati: II  TM Distance: >3 FB Neck ROM: Full    Dental no notable dental hx. (+) Partial Lower, Upper Dentures,    Pulmonary former smoker   Pulmonary exam normal breath sounds clear to auscultation       Cardiovascular hypertension, +CHF  Normal cardiovascular exam+ dysrhythmias Atrial Fibrillation + Valvular Problems/Murmurs (severe MR) MR  Rhythm:Irregular Rate:Normal  04/02/2023 cardac MR . Normal left ventricular size, thickness. Mildly reduced systolic function (LVEF = 47%).   There is global hypokinesis.   There is no late gadolinium enhancement in the l    Neuro/Psych    GI/Hepatic ,GERD  ,,  Endo/Other  Hypothyroidism    Renal/GU Renal InsufficiencyRenal diseaseStage IV     Musculoskeletal   Abdominal   Peds  Hematology   Anesthesia Other Findings All: See list  Reproductive/Obstetrics                             Anesthesia Physical Anesthesia Plan  ASA: 3  Anesthesia Plan: General   Post-op Pain Management:    Induction: Intravenous  PONV Risk Score and Plan: 2 and Propofol infusion and Treatment may vary due to age or medical condition  Airway Management Planned:   Additional Equipment: None  Intra-op Plan:   Post-operative Plan: Extubation in OR  Informed Consent: I have reviewed the patients History and Physical, chart, labs and discussed the procedure including the risks, benefits and alternatives for the proposed anesthesia with the patient or authorized representative who has indicated his/her understanding and acceptance.     Dental advisory given  Plan Discussed with: CRNA  Anesthesia Plan Comments:         Anesthesia Quick Evaluation

## 2023-08-04 ENCOUNTER — Ambulatory Visit (HOSPITAL_COMMUNITY): Payer: Medicare Other | Admitting: Anesthesiology

## 2023-08-04 ENCOUNTER — Other Ambulatory Visit: Payer: Self-pay

## 2023-08-04 ENCOUNTER — Ambulatory Visit (HOSPITAL_COMMUNITY)
Admission: RE | Disposition: A | Discharge status: Home / Self Care | Payer: Self-pay | Source: Home / Self Care | Attending: Cardiology

## 2023-08-04 ENCOUNTER — Encounter: Payer: Self-pay | Admitting: Oncology

## 2023-08-04 ENCOUNTER — Ambulatory Visit (HOSPITAL_COMMUNITY): Payer: Medicare Other

## 2023-08-04 ENCOUNTER — Other Ambulatory Visit (HOSPITAL_COMMUNITY): Payer: Self-pay

## 2023-08-04 ENCOUNTER — Ambulatory Visit (HOSPITAL_COMMUNITY): Payer: Self-pay | Admitting: Anesthesiology

## 2023-08-04 ENCOUNTER — Ambulatory Visit (HOSPITAL_COMMUNITY)
Admission: RE | Admit: 2023-08-04 | Discharge: 2023-08-04 | Disposition: A | Discharge status: Home / Self Care | Payer: Medicare Other | Attending: Cardiology | Admitting: Cardiology

## 2023-08-04 DIAGNOSIS — Z87891 Personal history of nicotine dependence: Secondary | ICD-10-CM | POA: Diagnosis not present

## 2023-08-04 DIAGNOSIS — I4819 Other persistent atrial fibrillation: Secondary | ICD-10-CM | POA: Diagnosis not present

## 2023-08-04 DIAGNOSIS — I700 Atherosclerosis of aorta: Secondary | ICD-10-CM | POA: Diagnosis not present

## 2023-08-04 DIAGNOSIS — I4891 Unspecified atrial fibrillation: Secondary | ICD-10-CM

## 2023-08-04 DIAGNOSIS — I5022 Chronic systolic (congestive) heart failure: Secondary | ICD-10-CM | POA: Insufficient documentation

## 2023-08-04 DIAGNOSIS — I483 Typical atrial flutter: Secondary | ICD-10-CM | POA: Diagnosis not present

## 2023-08-04 DIAGNOSIS — I340 Nonrheumatic mitral (valve) insufficiency: Secondary | ICD-10-CM

## 2023-08-04 DIAGNOSIS — I130 Hypertensive heart and chronic kidney disease with heart failure and stage 1 through stage 4 chronic kidney disease, or unspecified chronic kidney disease: Secondary | ICD-10-CM | POA: Insufficient documentation

## 2023-08-04 DIAGNOSIS — N184 Chronic kidney disease, stage 4 (severe): Secondary | ICD-10-CM | POA: Insufficient documentation

## 2023-08-04 DIAGNOSIS — I480 Paroxysmal atrial fibrillation: Secondary | ICD-10-CM

## 2023-08-04 HISTORY — PX: ATRIAL FIBRILLATION ABLATION: EP1191

## 2023-08-04 HISTORY — PX: TRANSESOPHAGEAL ECHOCARDIOGRAM (CATH LAB): EP1270

## 2023-08-04 LAB — ECHO TEE
Height: 65 in
Weight: 1920 [oz_av]

## 2023-08-04 LAB — CBC
HCT: 46 % (ref 36.0–46.0)
Hemoglobin: 14.4 g/dL (ref 12.0–15.0)
MCH: 30.6 pg (ref 26.0–34.0)
MCHC: 31.3 g/dL (ref 30.0–36.0)
MCV: 97.9 fL (ref 80.0–100.0)
Platelets: 152 10*3/uL (ref 150–400)
RBC: 4.7 MIL/uL (ref 3.87–5.11)
RDW: 13.5 % (ref 11.5–15.5)
WBC: 7.7 10*3/uL (ref 4.0–10.5)
nRBC: 0 % (ref 0.0–0.2)

## 2023-08-04 LAB — POCT ACTIVATED CLOTTING TIME: Activated Clotting Time: 251 s

## 2023-08-04 SURGERY — ATRIAL FIBRILLATION ABLATION
Anesthesia: General

## 2023-08-04 MED ORDER — FENTANYL CITRATE (PF) 100 MCG/2ML IJ SOLN
INTRAMUSCULAR | Status: AC
  Filled 2023-08-04: qty 2

## 2023-08-04 MED ORDER — HEPARIN SODIUM (PORCINE) 1000 UNIT/ML IJ SOLN
INTRAMUSCULAR | Status: AC
  Filled 2023-08-04: qty 10

## 2023-08-04 MED ORDER — ATROPINE SULFATE 1 MG/10ML IJ SOSY
PREFILLED_SYRINGE | INTRAMUSCULAR | Status: DC | PRN
  Administered 2023-08-04: 1 mg via INTRAVENOUS

## 2023-08-04 MED ORDER — SUGAMMADEX SODIUM 200 MG/2ML IV SOLN
INTRAVENOUS | Status: DC | PRN
  Administered 2023-08-04: 200 mg via INTRAVENOUS

## 2023-08-04 MED ORDER — ROCURONIUM BROMIDE 10 MG/ML (PF) SYRINGE
PREFILLED_SYRINGE | INTRAVENOUS | Status: DC | PRN
  Administered 2023-08-04: 20 mg via INTRAVENOUS
  Administered 2023-08-04: 50 mg via INTRAVENOUS
  Administered 2023-08-04: 20 mg via INTRAVENOUS

## 2023-08-04 MED ORDER — PANTOPRAZOLE SODIUM 40 MG PO TBEC
40.0000 mg | DELAYED_RELEASE_TABLET | Freq: Every day | ORAL | 0 refills | Status: DC
  Filled 2023-08-04: qty 45, 45d supply, fill #0

## 2023-08-04 MED ORDER — ONDANSETRON HCL 4 MG/2ML IJ SOLN
INTRAMUSCULAR | Status: DC | PRN
  Administered 2023-08-04: 4 mg via INTRAVENOUS

## 2023-08-04 MED ORDER — HEPARIN (PORCINE) IN NACL 1000-0.9 UT/500ML-% IV SOLN
INTRAVENOUS | Status: DC | PRN
  Administered 2023-08-04 (×3): 500 mL

## 2023-08-04 MED ORDER — PHENYLEPHRINE HCL-NACL 20-0.9 MG/250ML-% IV SOLN
INTRAVENOUS | Status: DC | PRN
  Administered 2023-08-04: 30 ug/min via INTRAVENOUS

## 2023-08-04 MED ORDER — DEXAMETHASONE SODIUM PHOSPHATE 10 MG/ML IJ SOLN
INTRAMUSCULAR | Status: DC | PRN
  Administered 2023-08-04: 10 mg via INTRAVENOUS

## 2023-08-04 MED ORDER — PHENYLEPHRINE 80 MCG/ML (10ML) SYRINGE FOR IV PUSH (FOR BLOOD PRESSURE SUPPORT)
PREFILLED_SYRINGE | INTRAVENOUS | Status: DC | PRN
  Administered 2023-08-04: 160 ug via INTRAVENOUS

## 2023-08-04 MED ORDER — PROTAMINE SULFATE 10 MG/ML IV SOLN
INTRAVENOUS | Status: DC | PRN
  Administered 2023-08-04: 30 mg via INTRAVENOUS

## 2023-08-04 MED ORDER — ACETAMINOPHEN 325 MG PO TABS: 650.0000 mg | ORAL_TABLET | ORAL | Status: DC | PRN

## 2023-08-04 MED ORDER — HEPARIN SODIUM (PORCINE) 1000 UNIT/ML IJ SOLN
INTRAMUSCULAR | Status: DC | PRN
  Administered 2023-08-04: 1000 [IU] via INTRAVENOUS

## 2023-08-04 MED ORDER — PROPOFOL 10 MG/ML IV BOLUS
INTRAVENOUS | Status: DC | PRN
  Administered 2023-08-04: 110 mg via INTRAVENOUS

## 2023-08-04 MED ORDER — HEPARIN SODIUM (PORCINE) 1000 UNIT/ML IJ SOLN
INTRAMUSCULAR | Status: DC | PRN
  Administered 2023-08-04: 4000 [IU] via INTRAVENOUS
  Administered 2023-08-04: 8000 [IU] via INTRAVENOUS

## 2023-08-04 MED ORDER — APIXABAN 2.5 MG PO TABS
2.5000 mg | ORAL_TABLET | Freq: Two times a day (BID) | ORAL | Status: DC
  Administered 2023-08-04: 2.5 mg via ORAL
  Filled 2023-08-04: qty 1

## 2023-08-04 MED ORDER — FENTANYL CITRATE (PF) 250 MCG/5ML IJ SOLN
INTRAMUSCULAR | Status: DC | PRN
  Administered 2023-08-04: 50 ug via INTRAVENOUS

## 2023-08-04 MED ORDER — LIDOCAINE 2% (20 MG/ML) 5 ML SYRINGE
INTRAMUSCULAR | Status: DC | PRN
  Administered 2023-08-04: 80 mg via INTRAVENOUS

## 2023-08-04 MED ORDER — ONDANSETRON HCL 4 MG/2ML IJ SOLN: 4.0000 mg | Freq: Four times a day (QID) | INTRAMUSCULAR | Status: DC | PRN

## 2023-08-04 MED ORDER — SODIUM CHLORIDE 0.9 % IV SOLN: INTRAVENOUS | Status: DC

## 2023-08-04 MED ORDER — SODIUM CHLORIDE 0.9 % IV SOLN: 250.0000 mL | INTRAVENOUS | Status: DC | PRN

## 2023-08-04 MED ORDER — COLCHICINE 0.6 MG PO TABS
0.6000 mg | ORAL_TABLET | Freq: Every day | ORAL | Status: DC
  Administered 2023-08-04: 0.6 mg via ORAL
  Filled 2023-08-04: qty 1

## 2023-08-04 MED ORDER — SODIUM CHLORIDE 0.9% FLUSH: 3.0000 mL | Freq: Two times a day (BID) | INTRAVENOUS | Status: DC

## 2023-08-04 MED ORDER — PANTOPRAZOLE SODIUM 40 MG PO TBEC
40.0000 mg | DELAYED_RELEASE_TABLET | Freq: Every day | ORAL | Status: DC
  Administered 2023-08-04: 40 mg via ORAL
  Filled 2023-08-04: qty 1

## 2023-08-04 MED ORDER — SODIUM CHLORIDE 0.9% FLUSH: 3.0000 mL | INTRAVENOUS | Status: DC | PRN

## 2023-08-04 MED ORDER — OMEPRAZOLE 20 MG PO CPDR: 20.0000 mg | DELAYED_RELEASE_CAPSULE | Freq: Every day | ORAL | Status: AC

## 2023-08-04 SURGICAL SUPPLY — 21 items
BAG SNAP BAND KOVER 36X36 (MISCELLANEOUS) IMPLANT
CABLE PFA RX CATH CONN (CABLE) IMPLANT
CATH FARAWAVE ABLATION 31 (CATHETERS) IMPLANT
CATH OCTARAY 2.0 F 3-3-3-3-3 (CATHETERS) IMPLANT
CATH SMTCH THERMOCOOL SF DF (CATHETERS) IMPLANT
CATH SOUNDSTAR ECO 8FR (CATHETERS) IMPLANT
CATH WEBSTER BI DIR CS D-F CRV (CATHETERS) IMPLANT
CLOSURE PERCLOSE PROSTYLE (VASCULAR PRODUCTS) IMPLANT
COVER SWIFTLINK CONNECTOR (BAG) ×1 IMPLANT
DILATOR VESSEL 38 20CM 16FR (INTRODUCER) IMPLANT
GUIDEWIRE INQWIRE 1.5J.035X260 (WIRE) IMPLANT
INQWIRE 1.5J .035X260CM (WIRE) ×1
KIT VERSACROSS CNCT FARADRIVE (KITS) IMPLANT
PACK EP LF (CUSTOM PROCEDURE TRAY) ×1 IMPLANT
PAD DEFIB RADIO PHYSIO CONN (PAD) ×1 IMPLANT
PATCH CARTO3 (PAD) IMPLANT
SHEATH FARADRIVE STEERABLE (SHEATH) IMPLANT
SHEATH PINNACLE 8F 10CM (SHEATH) IMPLANT
SHEATH PINNACLE 9F 10CM (SHEATH) IMPLANT
SHEATH PROBE COVER 6X72 (BAG) IMPLANT
TUBING SMART ABLATE COOLFLOW (TUBING) IMPLANT

## 2023-08-04 NOTE — Anesthesia Postprocedure Evaluation (Signed)
Anesthesia Post Note  Patient: Dawn Sherman  Procedure(s) Performed: ATRIAL FIBRILLATION ABLATION TRANSESOPHAGEAL ECHOCARDIOGRAM     Patient location during evaluation: PACU Anesthesia Type: General Level of consciousness: awake and alert Pain management: pain level controlled Vital Signs Assessment: post-procedure vital signs reviewed and stable Respiratory status: spontaneous breathing, nonlabored ventilation, respiratory function stable and patient connected to nasal cannula oxygen Cardiovascular status: blood pressure returned to baseline and stable Postop Assessment: no apparent nausea or vomiting Anesthetic complications: no   There were no known notable events for this encounter.  Last Vitals:  Vitals:   08/04/23 1203 08/04/23 1204  BP:    Pulse: 73 72  Resp: 15 14  Temp:    SpO2: (!) 88% 93%    Last Pain:  Vitals:   08/04/23 1145  TempSrc:   PainSc: Asleep                 Trevor Iha

## 2023-08-04 NOTE — H&P (Signed)
Electrophysiology Office Note:     Date:  08/04/2023    ID:  Dawn Sherman, DOB 1956/03/17, MRN [id number]   [facility] Cardiologist:  Julien Nordmann, MD  [facility] Electrophysiologist:  Lanier Prude, MD    Referring MD: Laurey Morale, MD    Chief Complaint: AF   History of Present Illness:     Dawn Sherman is a 67 y.o. femalewho I am seeing today for an evaluation of AFat the request of Dr Shirlee Latch.   The patient was last seen by Dr Shirlee Latch on 04/15/2023.   The patient has a medical history that includes COPD, pAF, MR, HTN, HLD, CKD4, prior ICH in setting of fall (now back on OAC) and systolic heart failure. EF at diagnosis was 30-35%.   Admitted 08/2022 with decompensated HF. Admitted 10/2022 with anemia (Hgb 7.1). Echo showed E F25-30 with severe MR/TR.   03/2023 cardiac MRI showed EF 47% with mild MR.    Now maintaining sinus on amiodarone.   She is doing well today.  She reports being active.  Her breathing is stable.  No fluid retention.  She takes her medications without missed doses.  She has not missed any Eliquis.    Presents for PVI today. Procedure reviewed.         Objective Their past medical, social and family history was reveiwed.     ROS:   Please see the history of present illness.    All other systems reviewed and are negative.   EKGs/Labs/Other Studies Reviewed:     The following studies were reviewed today:   04/02/2023 cMR EF47 No LGE Normal RV Mild MR            Physical Exam:     VS:  BP (!) 135/79   Pulse 74   Ht 5\' 5"  (1.651 m)   Wt 123 lb (55.8 kg)   SpO2 97%   BMI 20.47 kg/m         Wt Readings from Last 3 Encounters:  06/09/23 123 lb (55.8 kg)  04/23/23 120 lb 6.4 oz (54.6 kg)  04/15/23 121 lb (54.9 kg)      GEN:  Well nourished, well developed in no acute distress CARDIAC: RRR, no murmurs, rubs, gallops RESPIRATORY:  Clear to auscultation without rales, wheezing or rhonchi           Assessment ASSESSMENT AND PLAN:     1. Chronic systolic heart failure (HCC)   2. Encounter for long-term (current) use of high-risk medication   3. 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   Discussed treatment options today including AAD, catheter ablation.    I do think she is a reasonable candidate for catheter ablation.   ----------   Discussed treatment options today for AF including antiarrhythmic drug therapy and ablation. Discussed risks, recovery and likelihood of success with each treatment strategy. Risk, benefits, and alternatives to EP study and ablation for afib were discussed. These risks include but are not limited to stroke, bleeding, vascular damage, tamponade, perforation, damage to the esophagus, lungs, phrenic nerve and other structures, pulmonary vein stenosis, worsening renal function, coronary vasospasm and death.  Discussed potential need for repeat ablation procedures and antiarrhythmic drugs after an initial ablation. The patient understands these risk and wishes to proceed.  We will therefore proceed with catheter ablation at the next  available time.  Carto, ICE, anesthesia are requested for the procedure.      Plan for TEE on the table the day of ablation to assess LAA patency and measure for possible LAAO.      #Chronic systolic heart failure NYHA III. Warm and dry on exam today. Management complicated by CKD4 hx. Rhythm control indicated as above. Continue current medical therpay.   Presents for PVI today. Procedure reviewed.   Signed, Rossie Muskrat. Lalla Brothers, MD, [facility], [facility] 08/04/2023 Electrophysiology [facility]

## 2023-08-04 NOTE — Transfer of Care (Signed)
Immediate Anesthesia Transfer of Care Note  Patient: Dawn Sherman  Procedure(s) Performed: ATRIAL FIBRILLATION ABLATION TRANSESOPHAGEAL ECHOCARDIOGRAM  Patient Location: PACU  Anesthesia Type:General  Level of Consciousness: awake, alert , and oriented  Airway & Oxygen Therapy: Patient Spontanous Breathing  Post-op Assessment: Report given to RN and Post -op Vital signs reviewed and stable  Post vital signs: Reviewed and stable  Last Vitals:  Vitals Value Taken Time  BP    Temp    Pulse    Resp    SpO2      Last Pain:  Vitals:   08/04/23 0559  TempSrc:   PainSc: 0-No pain         Complications: There were no known notable events for this encounter.

## 2023-08-04 NOTE — Progress Notes (Signed)
  Echocardiogram Echocardiogram Transesophageal has been performed.  Dawn Sherman 08/04/2023, 9:08 AM

## 2023-08-04 NOTE — CV Procedure (Signed)
    TRANSESOPHAGEAL ECHOCARDIOGRAM   NAME:  Dawn Sherman   MRN: [id number] DOB:  10-28-1955   ADMIT DATE: 08/04/2023  INDICATIONS:  Atrial fibrillation  PROCEDURE:   Informed consent was obtained prior to the procedure. The risks, benefits and alternatives for the procedure were discussed and the patient comprehended these risks.  Risks include, but are not limited to, cough, sore throat, vomiting, nausea, somnolence, esophageal and stomach trauma or perforation, bleeding, low blood pressure, aspiration, pneumonia, infection, trauma to the teeth and death.    After a procedural time-out, the patient was sedated by the anesthesia service. Once an appropriate level of sedation was achieved, the transesophageal probe was inserted in the esophagus and stomach without difficulty and multiple views were obtained.    COMPLICATIONS:    There were no immediate complications.  FINDINGS:  LEFT VENTRICLE: EF = 55-60%. No regional wall motion abnormalities.  RIGHT VENTRICLE: Normal size and function.   LEFT ATRIUM: Normal  LEFT ATRIAL APPENDAGE: No thrombus.   RIGHT ATRIUM: Normal  AORTIC VALVE:  Trileaflet.   MITRAL VALVE:    Normal. Trivial MR  TRICUSPID VALVE: Normal.  PULMONIC VALVE: Grossly normal.  INTERATRIAL SEPTUM: + lipomatous hypertrophy. Possible small PFO   PERICARDIUM: No effusion  DESCENDING AORTA: Mild plaque    Iverson Sees,MD 8:42 AM

## 2023-08-04 NOTE — Anesthesia Procedure Notes (Signed)
Procedure Name: Intubation Date/Time: 08/04/2023 7:50 AM  Performed by: Georgianne Fick D, CRNAPre-anesthesia Checklist: Patient identified, Emergency Drugs available, Suction available and Patient being monitored Patient Re-evaluated:Patient Re-evaluated prior to induction Oxygen Delivery Method: Circle System Utilized Preoxygenation: Pre-oxygenation with 100% oxygen Induction Type: IV induction Ventilation: Mask ventilation without difficulty Laryngoscope Size: Mac and 3 Grade View: Grade I Tube type: Oral Number of attempts: 1 Airway Equipment and Method: Stylet and Oral airway Placement Confirmation: ETT inserted through vocal cords under direct vision, positive ETCO2 and breath sounds checked- equal and bilateral Secured at: 19 cm Tube secured with: Tape Dental Injury: Teeth and Oropharynx as per pre-operative assessment

## 2023-08-04 NOTE — Progress Notes (Signed)
Up and  walked and tolerated well; bilat groins stable, no bleeding or hematoma; per Dr Lalla Brothers ok to d/c home

## 2023-08-04 NOTE — Discharge Instructions (Signed)

## 2023-08-04 NOTE — Interval H&P Note (Signed)
History and Physical Interval Note:  08/04/2023 8:19 AM  Dawn Sherman  has presented today for surgery, with the diagnosis of afib.  The various methods of treatment have been discussed with the patient and family. After consideration of risks, benefits and other options for treatment, the patient has consented to  Procedure(s): ATRIAL FIBRILLATION ABLATION (N/A) TRANSESOPHAGEAL ECHOCARDIOGRAM (N/A) as a surgical intervention.  The patient's history has been reviewed, patient examined, no change in status, stable for surgery.  I have reviewed the patient's chart and labs.  Questions were answered to the patient's satisfaction.     Kileen Lange

## 2023-08-05 ENCOUNTER — Inpatient Hospital Stay: Payer: Medicare Other

## 2023-08-05 ENCOUNTER — Encounter (HOSPITAL_COMMUNITY): Payer: Self-pay | Admitting: Cardiology

## 2023-08-05 ENCOUNTER — Inpatient Hospital Stay: Payer: Medicare Other | Admitting: Oncology

## 2023-08-05 MED FILL — Fentanyl Citrate Preservative Free (PF) Inj 100 MCG/2ML: INTRAMUSCULAR | Qty: 1 | Status: AC

## 2023-08-12 NOTE — Telephone Encounter (Signed)
Fax doctor's portion to be fill and sign 08/12/23.

## 2023-08-15 ENCOUNTER — Inpatient Hospital Stay: Payer: Medicare Other | Admitting: Oncology

## 2023-08-15 ENCOUNTER — Inpatient Hospital Stay: Payer: Medicare Other

## 2023-08-25 ENCOUNTER — Encounter: Payer: Self-pay | Admitting: Oncology

## 2023-08-27 ENCOUNTER — Encounter: Payer: Self-pay | Admitting: Oncology

## 2023-08-28 ENCOUNTER — Other Ambulatory Visit: Payer: Self-pay | Admitting: Cardiovascular Disease

## 2023-08-28 ENCOUNTER — Other Ambulatory Visit: Payer: Self-pay | Admitting: Internal Medicine

## 2023-08-28 NOTE — Telephone Encounter (Signed)
 Please review

## 2023-08-28 NOTE — Telephone Encounter (Signed)
 Prescription refill request for Eliquis received. Indication: Last office visit: 07/22/23  [place] MD Scr: 2.73 on 08/11/23  Epic Age: 68 Weight: 55.1kg  Based on above findings Eliquis 2.5mg  twice daily is the appropriate dose.  Refill approved.

## 2023-08-28 NOTE — Telephone Encounter (Signed)
 Is being followed by cardiology. They usually prescribe the amiodarone. Please confirm with pt or cardiology. Let me know if a problem.  I do not want her to be out of her medication.

## 2023-09-01 ENCOUNTER — Ambulatory Visit (INDEPENDENT_AMBULATORY_CARE_PROVIDER_SITE_OTHER): Payer: Medicare Other | Admitting: Internal Medicine

## 2023-09-01 ENCOUNTER — Encounter: Payer: Self-pay | Admitting: Internal Medicine

## 2023-09-01 DIAGNOSIS — E7800 Pure hypercholesterolemia, unspecified: Secondary | ICD-10-CM

## 2023-09-01 DIAGNOSIS — Z0000 Encounter for general adult medical examination without abnormal findings: Secondary | ICD-10-CM

## 2023-09-01 NOTE — Assessment & Plan Note (Addendum)
 Did not show for appt 09/01/23.  Colonoscopy 12/2018 - normal.  Recommended f/u in 10 years.  PAP 02/2019 - negative with negative HPV.  Mammogram 06/25/23 - Birads I.

## 2023-09-01 NOTE — Progress Notes (Deleted)
 Subjective:    Patient ID: Luke LILLETTE Kipper, female    DOB: [birth year], 68 y.o.   MRN: [id number]  Patient here for No chief complaint on file.   HPI With past history of afib, CKD and chronic systolic heart failure, she comes in today to follow up on these issues as well as for a complete physical exam. Chronic afib - s/p ablation 08/04/23. Had f/u with cardiology 07/22/23. Cath in 4/24 showed no significant coronary disease. Cardiac MRI in 8/24 showed improvement; LV EF 47%, no myocardial delayed enhancement, normal RV size, RV EF 59%, mild MR. Coreg  increased to 18.75mg  bid. Plan for repeat echo 10/2023. Planning for [employer]. Seeing nephrology - last 07/07/23. On lokelma . Being evaluated for renal transplant. . Followed by Dr Melanee - IDA. Last evaluated 02/03/23 - hgb 12.8. recent hgb check 08/11/23 - 13.7.    Past Medical History:  Diagnosis Date   B12 deficiency    Cardiomyopathy (HCC)    a. 09/2022 Echo: EF 35-40%; b. 09/2022 MV: apical defect w/ mild peri-infarct ischemia. EF 43%.   Chronic HFrEF (heart failure with reduced ejection fraction) (HCC)    a. 01/2020 Echo: EF 30-35%; b. 07/2020 Echo: EF 50-55%; c. 06/2022 Echo: EF 50-55%; d. 09/2022 Echo: EF 35-40%, globh HK, mod reduced RV fxn, sev BAE, mod MR.   CKD (chronic kidney disease), stage IV (HCC)    COPD (chronic obstructive pulmonary disease) (HCC)    Depression    Endometriosis    Hypertension    Mixed hyperlipidemia    Moderate mitral regurgitation    Normocytic anemia    PAF (paroxysmal atrial fibrillation) (HCC)    a. CHA2DS2VASc = 4-->eliquis  2.5 bid.   Paroxymsal Atrial flutter (HCC)    Tobacco abuse    Past Surgical History:  Procedure Laterality Date   ABDOMINAL HYSTERECTOMY     ATRIAL FIBRILLATION ABLATION N/A 08/04/2023   Procedure: ATRIAL FIBRILLATION ABLATION;  Surgeon: Cindie Ole DASEN, MD;  Location: [facility] INVASIVE CV LAB;  Service: Cardiovascular;  Laterality: N/A;   BREAST BIOPSY Right 2015   neg-   FIBROADENOMA   CENTRAL LINE INSERTION  11/15/2022   Procedure: CENTRAL LINE INSERTION;  Surgeon: Mady Bruckner, MD;  Location: [facility] INVASIVE CV LAB;  Service: Cardiovascular;;   COLONOSCOPY WITH PROPOFOL  N/A 11/24/2022   Procedure: COLONOSCOPY WITH PROPOFOL ;  Surgeon: Maryruth Ole DASEN, MD;  Location: [facility] ENDOSCOPY;  Service: Endoscopy;  Laterality: N/A;   COLONOSCOPY WITH PROPOFOL  N/A 11/23/2022   Procedure: COLONOSCOPY WITH PROPOFOL ;  Surgeon: Maryruth Ole DASEN, MD;  Location: [facility] ENDOSCOPY;  Service: Endoscopy;  Laterality: N/A;   ESOPHAGOGASTRODUODENOSCOPY (EGD) WITH PROPOFOL  N/A 11/23/2022   Procedure: ESOPHAGOGASTRODUODENOSCOPY (EGD) WITH PROPOFOL ;  Surgeon: Maryruth Ole DASEN, MD;  Location: [facility] ENDOSCOPY;  Service: Endoscopy;  Laterality: N/A;   RIGHT HEART CATH N/A 11/15/2022   Procedure: RIGHT HEART CATH;  Surgeon: Mady Bruckner, MD;  Location: [facility] INVASIVE CV LAB;  Service: Cardiovascular;  Laterality: N/A;   RIGHT/LEFT HEART CATH AND CORONARY ANGIOGRAPHY N/A 11/25/2022   Procedure: RIGHT/LEFT HEART CATH AND CORONARY ANGIOGRAPHY;  Surgeon: Darron Deatrice LABOR, MD;  Location: [facility] INVASIVE CV LAB;  Service: Cardiovascular;  Laterality: N/A;   TAH/RSO  1999   secondary to bleeding and endometriosis (Dr Trula)   TRANSESOPHAGEAL ECHOCARDIOGRAM (CATH LAB) N/A 08/04/2023   Procedure: TRANSESOPHAGEAL ECHOCARDIOGRAM;  Surgeon: Cindie Ole DASEN, MD;  Location: [facility] INVASIVE CV LAB;  Service: Cardiovascular;  Laterality: N/A;   Family History  Problem Relation Age of Onset  Hypercholesterolemia Mother    Rheum arthritis Father    Rheum arthritis Daughter    Fibromyalgia Daughter    Breast cancer Neg Hx    Colon cancer Neg Hx    Social History   Socioeconomic History   Marital status: Married    Spouse name: Not on file   Number of children: Not on file   Years of education: Not on file   Highest education level: Not on file  Occupational History   Not on file  Tobacco  Use   Smoking status: Former    Current packs/day: 0.25    Average packs/day: 0.3 packs/day for 40.0 years (10.0 ttl pk-yrs)    Types: Cigarettes   Smokeless tobacco: Never   Tobacco comments:    Patient quit smoking recently   Vaping Use   Vaping status: Never Used  Substance and Sexual Activity   Alcohol use: Not Currently    Alcohol/week: 0.0 standard drinks of alcohol    Comment: occasional   Drug use: No   Sexual activity: Not Currently  Other Topics Concern   Not on file  Social History Narrative   Lives in [place] with her husband.  She is retired from [employer]	[place].  She does not routinely exercise.   Social Drivers of Corporate Investment Banker Strain: Low Risk  (12/12/2022)   Overall Financial Resource Strain (CARDIA)    Difficulty of Paying Living Expenses: Not hard at all  Food Insecurity: No Food Insecurity (12/12/2022)   Hunger Vital Sign    Worried About Running Out of Food in the Last Year: Never true    Ran Out of Food in the Last Year: Never true  Transportation Needs: No Transportation Needs (12/12/2022)   PRAPARE - Administrator, Civil Service (Medical): No    Lack of Transportation (Non-Medical): No  Physical Activity: Sufficiently Active (12/12/2022)   Exercise Vital Sign    Days of Exercise per Week: 7 days    Minutes of Exercise per Session: 30 min  Stress: No Stress Concern Present (12/12/2022)   [employer] of Occupational Health - Occupational Stress Questionnaire    Feeling of Stress : Not at all  Social Connections: Unknown (12/12/2022)   Social Connection and Isolation Panel [NHANES]    Frequency of Communication with Friends and Family: Not on file    Frequency of Social Gatherings with Friends and Family: Not on file    Attends Religious Services: Not on file    Active Member of Clubs or Organizations: Not on file    Attends Banker Meetings: Not on file    Marital Status: Married     Review of  Systems     Objective:     There were no vitals taken for this visit. Wt Readings from Last 3 Encounters:  08/04/23 120 lb (54.4 kg)  07/22/23 121 lb 8 oz (55.1 kg)  07/14/23 122 lb 2 oz (55.4 kg)    Physical Exam   Outpatient Encounter Medications as of 09/01/2023  Medication Sig   acetaminophen  (TYLENOL ) 325 MG tablet Take 650 mg by mouth every 6 (six) hours as needed (pain.).   amiodarone  (PACERONE ) 200 MG tablet Take 1 tablet (200 mg total) by mouth daily.   apixaban  (ELIQUIS ) 2.5 MG TABS tablet TAKE ONE TABLET BY MOUTH TWICE A DAY   atorvastatin  (LIPITOR) 40 MG tablet Take 1 tablet (40 mg total) by mouth daily.   buPROPion  (WELLBUTRIN  XL) 150 MG  24 hr tablet Take 1 tablet (150 mg total) by mouth daily.   calcitRIOL  (ROCALTROL ) 0.25 MCG capsule Take 0.25 mcg by mouth daily.   calcium  carbonate (OS-CAL - DOSED IN MG OF ELEMENTAL CALCIUM ) 1250 (500 Ca) MG tablet Take 1 tablet (500 mg of elemental calcium  total) by mouth 3 (three) times daily with meals.   carvedilol  (COREG ) 12.5 MG tablet Take 1.5 tablets (18.75 mg total) by mouth 2 (two) times daily. (Patient taking differently: Take 12.5 mg by mouth 2 (two) times daily.)   FARXIGA  10 MG TABS tablet Take 10 mg by mouth daily.   hydrALAZINE  (APRESOLINE ) 25 MG tablet Take 1 tablet (25 mg total) by mouth 3 (three) times daily.   isosorbide  mononitrate (IMDUR ) 30 MG 24 hr tablet Take 1 tablet (30 mg total) by mouth daily.   levothyroxine  (SYNTHROID ) 137 MCG tablet TAKE ONE TABLET BY MOUTH EVERY DAY   loratadine  (CLARITIN ) 10 MG tablet Take 10 mg by mouth daily.   [START ON 09/18/2023] omeprazole  (PRILOSEC) 20 MG capsule Take 1 capsule (20 mg total) by mouth daily.   pantoprazole  (PROTONIX ) 40 MG tablet Take 1 tablet (40 mg total) by mouth daily.   Tiotropium Bromide -Olodaterol 2.5-2.5 MCG/ACT AERS Inhale 2 puffs into the lungs daily. (Patient not taking: Reported on 07/30/2023)   torsemide  (DEMADEX ) 20 MG tablet TAKE 60 MG ALTERNATING  WITH 40 MG EVERY OTHER DAY   venlafaxine  XR (EFFEXOR -XR) 150 MG 24 hr capsule TAKE ONE CAPSULE BY MOUTH EVERY DAY   No facility-administered encounter medications on file as of 09/01/2023.     Lab Results  Component Value Date   WBC 7.7 08/04/2023   HGB 14.4 08/04/2023   HCT 46.0 08/04/2023   PLT 152 08/04/2023   GLUCOSE 91 07/22/2023   CHOL 164 01/02/2023   TRIG 64.0 01/02/2023   HDL 83.70 01/02/2023   LDLDIRECT 53.3 10/14/2012   LDLCALC 68 01/02/2023   ALT 14 07/22/2023   AST 24 07/22/2023   NA 139 07/22/2023   K 4.8 07/22/2023   CL 100 07/22/2023   CREATININE 3.29 (H) 07/22/2023   BUN 49 (H) 07/22/2023   CO2 21 07/22/2023   TSH 0.506 07/22/2023   INR 1.2 11/21/2022   HGBA1C 4.8 01/02/2023    ECHO TEE Result Date: 08/04/2023    TRANSESOPHOGEAL ECHO REPORT   Patient Name:   ATLEIGH GRUEN Date of Exam: 08/04/2023 Medical Rec #:  [id number]     Height:       65.0 in Accession #:    [id number]    Weight:       120.0 lb Date of Birth:  06-28-1956    BSA:          1.592 m Patient Age:    67 years      BP:           135/79 mmHg Patient Gender: F             HR:           62 bpm. Exam Location:  Inpatient Procedure: Transesophageal Echo and Color Doppler Indications:     I48.91* Unspeicified atrial fibrillation  History:         Patient has prior history of Echocardiogram examinations, most                  recent 11/14/2022. CHF and Cardiomyopathy, Abnormal ECG,  Arrythmias:Atrial Fibrillation, AV block and Tachycardia,                  Signs/Symptoms:Chest Pain; Risk Factors:Current Smoker and                  Dyslipidemia. ETOH.  Sonographer:     Ellouise Mose RDCS Referring Phys:  [id number] OLE ONEIDA HOLTS Diagnosing Phys: Toribio Fuel MD PROCEDURE: After discussion of the risks and benefits of a TEE, an informed consent was obtained from the patient. The patient was intubated. The transesophogeal probe was passed without difficulty through the esophogus of the patient.  Imaged were obtained with the patient in a supine position. Sedation performed by different physician. The patient was monitored while under deep sedation. The patient's vital signs; including heart rate, blood pressure, and oxygen saturation; remained stable throughout the procedure. The patient developed no complications during the procedure. Continuous sedation prior to abalation procedure.  IMPRESSIONS  1. Left ventricular ejection fraction, by estimation, is 50 to 55%. The left ventricle has low normal function.  2. Right ventricular systolic function is normal. The right ventricular size is normal.  3. Left atrial size was mildly dilated. No left atrial/left atrial appendage thrombus was detected.  4. The mitral valve is grossly normal. Mild mitral valve regurgitation. No evidence of mitral stenosis.  5. The aortic valve is normal in structure. Aortic valve regurgitation is trivial. No aortic stenosis is present.  6. There is mild (Grade II) plaque involving the descending aorta.  7. Continuous sedation prior to abalation procedure. FINDINGS  Left Ventricle: Left ventricular ejection fraction, by estimation, is 50 to 55%. The left ventricle has low normal function. The left ventricular internal cavity size was normal in size. Right Ventricle: The right ventricular size is normal. No increase in right ventricular wall thickness. Right ventricular systolic function is normal. Left Atrium: Left atrial size was mildly dilated. No left atrial/left atrial appendage thrombus was detected. Right Atrium: Right atrial size was normal in size. Pericardium: There is no evidence of pericardial effusion. Mitral Valve: The mitral valve is grossly normal. Mild mitral valve regurgitation. No evidence of mitral valve stenosis. Tricuspid Valve: The tricuspid valve is grossly normal. Tricuspid valve regurgitation is not demonstrated. Aortic Valve: The aortic valve is normal in structure. Aortic valve regurgitation is trivial. No  aortic stenosis is present. Pulmonic Valve: The pulmonic valve was grossly normal. Pulmonic valve regurgitation is trivial. Aorta: The aortic root and ascending aorta are structurally normal, with no evidence of dilitation. There is mild (Grade II) plaque involving the descending aorta. IAS/Shunts: The interatrial septum appears to be lipomatous. No atrial level shunt detected by color flow Doppler. Toribio Fuel MD Electronically signed by Toribio Fuel MD Signature Date/Time: 08/04/2023/11:40:25 AM    Final    EP STUDY Result Date: 08/04/2023 CONCLUSIONS: 1. Successful PVI 2. Successful ablation/isolation of the posterior wall 3. Successful ablation of the cavotricuspid isthmus for typical atrial flutter 4. Intracardiac echo reveals trivial pericardial effusion, small right inferior pulmonary vein 5. No early apparent complications. 6. Colchicine  0.6mg  PO BID x 5 days 7. Protonix  40mg  PO daily x 45 days       Assessment & Plan:  There are no diagnoses linked to this encounter.   Allena Hamilton, MD

## 2023-09-01 NOTE — Progress Notes (Signed)
 Patient ID: Dawn Sherman, female   DOB: 1956/03/03, 68 y.o.   MRN: [id number] Did not show for appt.

## 2023-09-02 ENCOUNTER — Telehealth: Payer: Self-pay

## 2023-09-02 NOTE — Telephone Encounter (Signed)
 If she is out, ok to refill for 30 days. Per note, message sent to cardiology to refill.  Usually cardiology follows.

## 2023-09-02 NOTE — Telephone Encounter (Signed)
 This was sent to Dr Mariah Milling but no response yet, pt is out of medication. OK to send in 30 day?

## 2023-09-02 NOTE — Telephone Encounter (Signed)
 See refill request.

## 2023-09-02 NOTE — Telephone Encounter (Signed)
 Copied from CRM [id number]. Topic: Clinical - Prescription Issue >> Sep 02, 2023  9:44 AM Antonio DEL wrote: Reason for CRM: Pharmacy called about refill on amiodarone  (PACERONE ) 200 MG, patient is out of medication. Refill request was sent on 1/2, pending signature

## 2023-09-03 NOTE — Progress Notes (Signed)
 Electrophysiology Clinic Note    Date:  09/03/2023  Patient ID:  Dawn Sherman, Dawn Sherman [birth year], MRN [id number] PCP:  Glendia Shad, MD  Cardiologist:  Evalene Lunger, MD HF Cardiologist: Ezra Shuck, MD Electrophysiologist: OLE ONEIDA HOLTS, MD    Discussed the use of AI scribe software for clinical note transcription with the patient, who gave verbal consent to proceed.   Patient Profile    Chief Complaint: AFib ablation follow-up  History of Present Illness: Dawn Sherman is a 68 y.o. female with PMH notable for parox AFib, HFimpEF, COPD, MR, HTN, HLD, CKD-stage 4, hypothyroid; seen today for OLE ONEIDA HOLTS, MD for routine electrophysiology followup.  Her previous HF was thought to be d/t tachy-mediated from AFib.  She is s/p AFib ablation w PVI, posterior wall, and CTI on 08/04/2023 by Dr. Holts.  She is also in process for LAAO w watchman, not yet scheduled.  On follow-up today, she is not aware of any AF episodes, has always been asymptomatic during episodes. She denies palpitations, chest pain, chest pressure, increased SOB or increased edema.  She recently stopped her eliquis  about a week ago d/t cost. It is going to cost > $500 / month, which she cannot afford. She went through patent assistance recently, did not qualify. She gets farxiga  from the manufacturer.  She checks her BP daily, readings 130s/70s    AAD History: Amiodarone      ROS:  Please see the history of present illness. All other systems are reviewed and otherwise negative.    Physical Exam    VS:  There were no vitals taken for this visit. BMI: There is no height or weight on file to calculate BMI.  Wt Readings from Last 3 Encounters:  08/04/23 120 lb (54.4 kg)  07/22/23 121 lb 8 oz (55.1 kg)  07/14/23 122 lb 2 oz (55.4 kg)     GEN- The patient is well appearing, alert and oriented x 3 today.   Lungs- Clear to ausculation bilaterally, distant lung sounds, normal work of breathing.   Heart- Regular rate and rhythm, no murmurs, rubs or gallops Extremities- No peripheral edema, warm, dry    Studies Reviewed   Previous EP, cardiology notes.    EKG is ordered. Personal review of EKG from today shows:    EKG Interpretation Date/Time:  Thursday September 04 2023 13:21:46 EST Ventricular Rate:  73 PR Interval:  262 QRS Duration:  98 QT Interval:  370 QTC Calculation: 407 R Axis:   103  Text Interpretation: Sinus rhythm with 1st degree A-V block Rightward axis Left ventricular hypertrophy with repolarization abnormality ( Sokolow-Lyon ) Confirmed by Jonatan Wilsey [phone]) on 09/04/2023 1:50:33 PM     TEE, 08/04/2023  1. Left ventricular ejection fraction, by estimation, is 50 to 55%. The  left ventricle has low normal function.   2. Right ventricular systolic function is normal. The right ventricular  size is normal.   3. Left atrial size was mildly dilated. No left atrial/left atrial  appendage thrombus was detected.   4. The mitral valve is grossly normal. Mild mitral valve regurgitation.  No evidence of mitral stenosis.   5. The aortic valve is normal in structure. Aortic valve regurgitation is  trivial. No aortic stenosis is present.   6. There is mild (Grade II) plaque involving the descending aorta.   7. Continuous sedation prior to abalation procedure.   Cardiac MRI, 04/02/2023 1.  Normal LV size, mildly reduced LV systolic function.  LVEF 47%.  2.  There is no LGE or scar.  3.  No evidence for myocardial infiltration.  4.  Mildly elevated ECV value.  5.  Normal RV size and function.  6.  Findings suggest non ischemic cardiomyopathy.  TTE, 11/14/2022 1. Left ventricular ejection fraction, by estimation, is 25 to 30%. The  left ventricle has severely decreased function. The left ventricle  demonstrates global hypokinesis. There is moderate left ventricular  hypertrophy. Left ventricular diastolic  parameters are indeterminate.   2. Right ventricular  systolic function is moderately reduced. The right  ventricular size is normal. There is moderately elevated pulmonary artery  systolic pressure. The estimated right ventricular systolic pressure is  45.7 mmHg.   3. Left atrial size was severely dilated.   4. A small pericardial effusion is present.   5. The mitral valve is normal in structure. Severe mitral valve  regurgitation. No evidence of mitral stenosis.   6. Tricuspid valve regurgitation is moderate to severe.   7. The aortic valve is normal in structure. Aortic valve regurgitation is  not visualized. No aortic stenosis is present.   8. The inferior vena cava is normal in size with greater than 50%  respiratory variability, suggesting right atrial pressure of 3 mmHg.    Assessment and Plan     #) parox AFib #) high risk medication use - amiodarone  S/p ablation 07/2023 with Dr. Cindie Maintaining sinus rhythm since Continue 200mg  amiodarone  Continue 12.5mg  coreg  BID LFT and TSH stable as of Nov '24 lab  #) Hypercoag d/t parox afib CHA2DS2-VASc Score = at least 5 [CHF History: 1, HTN History: 1, Diabetes History: 0, Stroke History: 0, Vascular Disease History: 1, Age Score: 1, Gender Score: 1].  Therefore, the patient's annual risk of stroke is 7.2 %.    Recently stopped eliquis  d/t cost Will attempt to switch to xarelto  15mg  daily (Cr CL < 15ml/min). Asked patient to notify office if xarelto  is also cost-prohibitive. Consider warfarin d/t affordability  #) HFimpEF Follows with HF team Appears euvolemic GDMT includes coreg  18.75 BID, farxiga  10mg  Hydral for BP No Ace/arb/arni or spiro with CKD Continue torsemide  60/40 alternative days     Current medicines are reviewed at length with the patient today.   The patient has concerns regarding her medicines.  The following changes were made today:   START xarelto  15mg  daily  Labs/ tests ordered today include:  No orders of the defined types were placed in this  encounter.    Disposition: Follow up with Dr. Cindie or EP APP  in 2 months    Signed, Mitchell Epling, NP  09/03/23  9:05 PM  Electrophysiology [facility]

## 2023-09-04 ENCOUNTER — Encounter: Payer: Self-pay | Admitting: Cardiology

## 2023-09-04 ENCOUNTER — Ambulatory Visit: Payer: Medicare Other | Attending: Cardiology | Admitting: Cardiology

## 2023-09-04 VITALS — BP 166/101 | HR 73 | Ht 65.0 in | Wt 125.2 lb

## 2023-09-04 DIAGNOSIS — I4819 Other persistent atrial fibrillation: Secondary | ICD-10-CM | POA: Insufficient documentation

## 2023-09-04 MED ORDER — RIVAROXABAN 15 MG PO TABS: 15.0000 mg | ORAL_TABLET | Freq: Every day | ORAL | 3 refills | Status: DC

## 2023-09-04 NOTE — Patient Instructions (Signed)
 Medication Instructions:   STOP Eliquis  START Xarelto  15mg  - Take one tablet ( 15mg ) by mouth daily.   *If you need a refill on your cardiac medications before your next appointment, please call your pharmacy*   Lab Work:  None Ordered  If you have labs (blood work) drawn today and your tests are completely normal, you will receive your results only by: MyChart Message (if you have MyChart) OR A paper copy in the mail If you have any lab test that is abnormal or we need to change your treatment, we will call you to review the results.   Testing/Procedures:  None Ordered    Follow-Up: At [facility], you and your health needs are our priority.  As part of our continuing mission to provide you with exceptional heart care, we have created designated Provider Care Teams.  These Care Teams include your primary Cardiologist (physician) and Advanced Practice Providers (APPs -  Physician Assistants and Nurse Practitioners) who all work together to provide you with the care you need, when you need it.  We recommend signing up for the patient portal called MyChart.  Sign up information is provided on this After Visit Summary.  MyChart is used to connect with patients for Virtual Visits (Telemedicine).  Patients are able to view lab/test results, encounter notes, upcoming appointments, etc.  Non-urgent messages can be sent to your provider as well.   To learn more about what you can do with MyChart, go to [email or web address].    Your next appointment:   6 month(s)  Provider:   Suzann Riddle, NP    Other Instructions  Needs to see HF March 2025

## 2023-09-09 ENCOUNTER — Telehealth: Payer: Self-pay

## 2023-09-09 NOTE — Telephone Encounter (Signed)
 Submitted application to [employer] Dawn Sherman) renewal 2025 w insurance card.

## 2023-09-19 ENCOUNTER — Ambulatory Visit: Payer: Self-pay

## 2023-09-19 NOTE — Patient Outreach (Signed)
  Care Coordination   Follow Up Visit Note   09/19/2023 Name: Dawn Sherman MRN: [id number] DOB: March 16, 1956  Dawn Sherman is a 68 y.o. year old female who sees Dale Frankfort, MD for primary care. I spoke with  Dawn Sherman by phone today.  What matters to the patients health and wellness today?  Patient states she is doing very well. She reports having the ablation on 08/03/24. Denies any symptoms at this time related to atrial fibrillation. Patient states Watchman procedure has not been scheduled.  Patient denies any heart failure symptoms. She states she continues to weigh daily with today's weight being 125 lbs. She states she monitors her BP ever other day.   Patient reports blood pressures are ranging in the 120's/70-80's.  Patient states her Eliquis was switched to xeralto due to cost.  Denies any signs of bleeding.  Reports having upcoming visit with primary care provider 10/22/23.    Goals Addressed             This Visit's Progress    Continued improvement post hospitalization and management/ education of health conditions.       Interventions Today    Flowsheet Row Most Recent Value  Chronic Disease   Chronic disease during today's visit Congestive Heart Failure (CHF), Hypertension (HTN), Atrial Fibrillation (AFib)  General Interventions   General Interventions Discussed/Reviewed General Interventions Reviewed, Doctor Visits  [evaluation of current treatment plan for listed health conditions and patients adherence to plan as established by provider. Assessed for HF/ Atrial fibrillation symptoms and assessed BP readings.]  Doctor Visits Discussed/Reviewed Doctor Visits Reviewed  [discussed recent cardiology follow up visits.  Upcoming provider visits dicussed.  Advised to keep follow up visits as recommended.]  Education Interventions   Education Provided Provided Education  [Reviewed HF/ atrial fibrillation symptoms. Advisd to notify provider for increase symptoms or call 911  for severe symptoms.  Reviewed signs of bleeding due to taking xeralto. Advised to notify provider for bleeding symptoms immediately.]  Provided Verbal Education On Other  [Advised to continue monitoring weight and blood pressure daily and record. Inquired if patient has had ablation and watchman done.]  Pharmacy Interventions   Pharmacy Dicussed/Reviewed Pharmacy Topics Reviewed  [medications reviewed and compliance disucssed.]              SDOH assessments and interventions completed:  No     Care Coordination Interventions:  Yes, provided   Follow up plan: Follow up call scheduled for 10/23/23 at 2:45 pm    Encounter Outcome:  Patient Visit Completed   [name] RN, BSN, CCM [place]  [facility], Population Health Case Manager Phone: [phone]

## 2023-09-19 NOTE — Patient Instructions (Signed)
Visit Information  Thank you for taking time to visit with me today. Please don't hesitate to contact me if I can be of assistance to you.   Following are the goals we discussed today:   Goals Addressed             This Visit's Progress    Continued improvement post hospitalization and management/ education of health conditions.       Interventions Today    Flowsheet Row Most Recent Value  Chronic Disease   Chronic disease during today's visit Congestive Heart Failure (CHF), Hypertension (HTN), Atrial Fibrillation (AFib)  General Interventions   General Interventions Discussed/Reviewed General Interventions Reviewed, Doctor Visits  [evaluation of current treatment plan for listed health conditions and patients adherence to plan as established by provider. Assessed for HF/ Atrial fibrillation symptoms and assessed BP readings.]  Doctor Visits Discussed/Reviewed Doctor Visits Reviewed  [discussed recent cardiology follow up visits.  Upcoming provider visits dicussed.  Advised to keep follow up visits as recommended.]  Education Interventions   Education Provided Provided Education  [Reviewed HF/ atrial fibrillation symptoms. Advisd to notify provider for increase symptoms or call 911 for severe symptoms.  Reviewed signs of bleeding due to taking xeralto. Advised to notify provider for bleeding symptoms immediately.]  Provided Verbal Education On Other  [Advised to continue monitoring weight and blood pressure daily and record. Inquired if patient has had ablation and watchman done.]  Pharmacy Interventions   Pharmacy Dicussed/Reviewed Pharmacy Topics Reviewed  [medications reviewed and compliance disucssed.]              Our next appointment is by telephone on 10/23/23  at 2:45 pm  Please call the care guide team at [phone] if you need to cancel or reschedule your appointment.   If you are experiencing a Mental Health or Behavioral Health Crisis or need someone to talk to,  please call the Suicide and Crisis Lifeline: 988 call 1-800-273-TALK (toll free, 24 hour hotline)  Patient verbalizes understanding of instructions and care plan provided today and agrees to view in MyChart. Active MyChart status and patient understanding of how to access instructions and care plan via MyChart confirmed with patient.     [name] RN, BSN, CCM [employer], Population Health Case Manager Phone: [phone]

## 2023-09-24 ENCOUNTER — Other Ambulatory Visit: Payer: Self-pay | Admitting: Internal Medicine

## 2023-09-26 NOTE — Telephone Encounter (Signed)
Per [employer] pt has been APPROVED FOR 2025

## 2023-09-29 ENCOUNTER — Inpatient Hospital Stay: Payer: Medicare Other | Admitting: Oncology

## 2023-09-29 ENCOUNTER — Telehealth: Payer: Self-pay

## 2023-09-29 ENCOUNTER — Inpatient Hospital Stay: Payer: Medicare Other

## 2023-09-29 DIAGNOSIS — J432 Centrilobular emphysema: Secondary | ICD-10-CM

## 2023-09-29 NOTE — Telephone Encounter (Signed)
Copied from CRM [id number]. Topic: Referral - Request for Referral >> Sep 29, 2023  2:05 PM Elizebeth Brooking wrote: Did the patient discuss referral with their provider in the last year? Yes (If No - schedule appointment) (If Yes - send message)  Appointment offered? Yes  Type of order/referral and detailed reason for visit: Pulmonologist   Preference of office, provider, location: Doesn't matter   If referral order, have you been seen by this specialty before? No (If Yes, this issue or another issue? When? Where?  Can we respond through MyChart? Yes

## 2023-09-30 NOTE — Telephone Encounter (Signed)
Ok for referral. Does she need anything more from [place].

## 2023-09-30 NOTE — Telephone Encounter (Signed)
Patient says that she is getting ready to be put on the list for a kidney transplant but they want her to be cleared by pulmonology first. Would like to see [facility]. Ok for referral?

## 2023-10-01 ENCOUNTER — Other Ambulatory Visit: Payer: Self-pay

## 2023-10-01 ENCOUNTER — Telehealth: Payer: Self-pay

## 2023-10-01 ENCOUNTER — Telehealth: Payer: Self-pay | Admitting: Cardiology

## 2023-10-01 MED ORDER — WARFARIN SODIUM 2.5 MG PO TABS: 2.5000 mg | ORAL_TABLET | Freq: Every day | ORAL | 1 refills | Status: DC

## 2023-10-01 NOTE — Telephone Encounter (Signed)
 I received message from Mliss Hummer:   patient states that Eliquis  and Xarelto  are too expensive and would like to transition to Coumadin .    I recommended starting her on either 2.5 mg or 5 mg daily and overlap for 3 days with Xarelto  then stop Xarelto , continue Coumadin , and come in next Wednesday for New Coumadin  appt, either 10:30 or 2:30.

## 2023-10-01 NOTE — Addendum Note (Signed)
 Addended by: Victorino Grates D on: 10/01/2023 12:50 PM   Modules accepted: Orders

## 2023-10-01 NOTE — Telephone Encounter (Signed)
 Called patient, she states that the Xarelto  is expensive and she is not able to continue this either. She states she will have to switch over to the Coumadin .   She only has about 4 days left of the Xarelto . Advised with patient I would reach out to Suzann to get her recommendations and would call her back.   Patient verbalized understanding.

## 2023-10-01 NOTE — Telephone Encounter (Signed)
 Pt called in, pt is running out of Xarelto  and she was told to call back in because Suzann, NP was going to switch her to coumadin , please advise.

## 2023-10-01 NOTE — Telephone Encounter (Signed)
 Referral placed.

## 2023-10-01 NOTE — Telephone Encounter (Signed)
 RX sent to pharmacy.    Called and spoke with patient husband (okay per DPR) patient husband aware of update, but will have wife call back to discuss any questions.   Scheduled appointment with Coumadin  clinic next week.

## 2023-10-01 NOTE — Telephone Encounter (Signed)
 See other telephone encounter.  Patient RX sent to pharmacy, Coumadin  appointment made.

## 2023-10-02 ENCOUNTER — Telehealth: Payer: Self-pay | Admitting: Cardiology

## 2023-10-02 NOTE — Telephone Encounter (Signed)
 Pt c/o medication issue:  1. Name of Medication: warfarin (COUMADIN ) 2.5 MG tablet   2. How are you currently taking this medication (dosage and times per day)?    3. Are you having a reaction (difficulty breathing--STAT)? no  4. What is your medication issue? Calling to say the medication is a drug interaction with amiodarone  (PACERONE ) 200 MG tablet               . Wanted to make sure the dr is okay with that. Please advise

## 2023-10-02 NOTE — Telephone Encounter (Signed)
 Called patient, she just wanted to make sure she was suppose to continue Xarelto  and Coumadin - patient advised of previous note. Patient verbalized understanding she will continue both (per last note) she only has three days of Xarelto  left, then will continue Coumadin .

## 2023-10-02 NOTE — Telephone Encounter (Signed)
 Called and spoke with pharmacist, Silvano. Amiodarone  does interact with Warfarin and can cause INR it become supra therapeutic. I made pharmacy aware it is okay to refill the Warfarin prescription. Pt's INR will be monitored closely. Silvano states she reached out to pt and pt's states her Amiodarone  was actually discontinued and she is no longer taking this medication. Pt has scheduled appt with our coumadin  clinic in [place] on Wednesday, 10/08/23 - note placed on appt to make Ozell Kanner, RN  aware.

## 2023-10-02 NOTE — Telephone Encounter (Signed)
 Pt c/o medication issue:  1. Name of Medication: warfarin (COUMADIN ) 2.5 MG tablet   2. How are you currently taking this medication (dosage and times per day)?  Take 1 tablet (2.5 mg total) by mouth daily.     3. Are you having a reaction (difficulty breathing--STAT)? No  4. What is your medication issue? Patient was requesting to speak with RN Mliss in regard to this medication. The line was disconnected before sending the phone note, unsure how the line was disconnected. Please advise.

## 2023-10-03 NOTE — Progress Notes (Signed)
 Pharmacy Medication Assistance Program Note    10/03/2023  Patient ID: Dawn Sherman, female   DOB: 07/11/1956, 68 y.o.   MRN: [id number]     07/28/2023 09/26/2023 10/03/2023  Outreach Medication One  Initial Outreach Date (Medication One) 07/11/2023  09/09/2023  Manufacturer Medication One [employer]  [employer] [facility] Farxiga  Farxiga  Farxiga   Dose of Farxiga  10 mg  10mg   Type of Sport And Exercise Psychologist Assistance  Date Application Sent to Patient 07/28/2023    Application Items Requested Application;Proof of Income  Application;Proof of Income  Name of Prescriber   Charlene Scott  Application Items Received From Patient   Application;Proof of Income  Method Application Sent to Manufacturer   Fax  Patient Assistance Determination  Approved Approved  Approval Start Date  09/26/2023   Approval End Date  08/25/2024 08/25/2024  Patient Notification Method  Telephone Call MyChart  Telephone Call Outcome  Left Voicemail      Signature

## 2023-10-05 ENCOUNTER — Other Ambulatory Visit: Payer: Self-pay

## 2023-10-05 DIAGNOSIS — I480 Paroxysmal atrial fibrillation: Secondary | ICD-10-CM

## 2023-10-08 ENCOUNTER — Ambulatory Visit: Payer: Medicare Other | Attending: Internal Medicine

## 2023-10-08 DIAGNOSIS — I4891 Unspecified atrial fibrillation: Secondary | ICD-10-CM | POA: Diagnosis not present

## 2023-10-08 DIAGNOSIS — Z7901 Long term (current) use of anticoagulants: Secondary | ICD-10-CM | POA: Diagnosis not present

## 2023-10-08 LAB — POCT INR: INR: 1.4 — AB (ref 2.0–3.0)

## 2023-10-08 NOTE — Patient Instructions (Signed)
Take 1.5 Tablets today only then continue 1 tablet daily.  INR in 1 week.  Amiodarone started 2/12 200 mg Daily.  A full discussion of the nature of anticoagulants has been carried out.  A benefit risk analysis has been presented to the patient, so that they understand the justification for choosing anticoagulation at this time. The need for frequent and regular monitoring, precise dosage adjustment and compliance is stressed.  Side effects of potential bleeding are discussed.  The patient should avoid any OTC items containing aspirin or ibuprofen, and should avoid great swings in general diet.  Avoid alcohol consumption.  Call if any signs of abnormal bleeding.  [phone]

## 2023-10-15 ENCOUNTER — Ambulatory Visit: Payer: Medicare Other | Attending: Cardiovascular Disease

## 2023-10-15 ENCOUNTER — Other Ambulatory Visit: Payer: Self-pay

## 2023-10-15 DIAGNOSIS — Z7901 Long term (current) use of anticoagulants: Secondary | ICD-10-CM | POA: Diagnosis not present

## 2023-10-15 DIAGNOSIS — I480 Paroxysmal atrial fibrillation: Secondary | ICD-10-CM

## 2023-10-15 LAB — POCT INR: INR: 1.7 — AB (ref 2.0–3.0)

## 2023-10-15 MED ORDER — HYDRALAZINE HCL 25 MG PO TABS: 25.0000 mg | ORAL_TABLET | Freq: Three times a day (TID) | ORAL | 5 refills | Status: DC

## 2023-10-15 NOTE — Patient Instructions (Signed)
Start taking 1 tablet Daily, except 1.5 tablets on Monday, Wednesday and Friday.  INR in 1 week.   [phone]

## 2023-10-16 ENCOUNTER — Telehealth: Payer: Self-pay

## 2023-10-16 ENCOUNTER — Other Ambulatory Visit: Payer: Self-pay

## 2023-10-16 DIAGNOSIS — I4819 Other persistent atrial fibrillation: Secondary | ICD-10-CM

## 2023-10-16 DIAGNOSIS — I480 Paroxysmal atrial fibrillation: Secondary | ICD-10-CM

## 2023-10-16 DIAGNOSIS — D509 Iron deficiency anemia, unspecified: Secondary | ICD-10-CM

## 2023-10-16 NOTE — Telephone Encounter (Signed)
The patient had her ablation in January 2025 and wished to proceed with LAAO on 12/18/2023. Since her ablation, she was switched from her a/c to Coumadin. She understood she will need weekly INR checks prior to procedure.  She is scheduled for OV with Sherie Don 3/17. She will be given procedure instructions at that visit. She understands she will be called prior to the procedure to review instructions again. She was grateful for assistance and agreed with plan.

## 2023-10-16 NOTE — Telephone Encounter (Signed)
-----   Message from Nurse Dierdre Harness sent at 10/16/2023  1:54 PM EST ----- Regarding: LAAO 4/24 precert and INR checks LAAO scheduled 4/24 with Dr. Lalla Brothers Dx: afib Booking ID: [id number]  Coumadin Clinic- she will need 4 weekly INR checks (including the Monday before the procedure - 4/21).   Thank you so much everyone!  KK

## 2023-10-16 NOTE — Telephone Encounter (Signed)
Note placed on upcoming appt with [facility] for pt to start Weekly INR checks for upcoming procedure.

## 2023-10-20 ENCOUNTER — Encounter: Payer: Self-pay | Admitting: Oncology

## 2023-10-22 ENCOUNTER — Ambulatory Visit: Payer: Medicare Other | Attending: Cardiovascular Disease

## 2023-10-22 ENCOUNTER — Encounter: Payer: Self-pay | Admitting: Internal Medicine

## 2023-10-22 ENCOUNTER — Ambulatory Visit (INDEPENDENT_AMBULATORY_CARE_PROVIDER_SITE_OTHER): Payer: Medicare Other | Admitting: Internal Medicine

## 2023-10-22 VITALS — BP 108/68 | HR 84 | Temp 97.9°F | Resp 16 | Ht 65.0 in | Wt 122.4 lb

## 2023-10-22 DIAGNOSIS — I10 Essential (primary) hypertension: Secondary | ICD-10-CM | POA: Diagnosis not present

## 2023-10-22 DIAGNOSIS — E039 Hypothyroidism, unspecified: Secondary | ICD-10-CM | POA: Diagnosis not present

## 2023-10-22 DIAGNOSIS — I420 Dilated cardiomyopathy: Secondary | ICD-10-CM

## 2023-10-22 DIAGNOSIS — F320 Major depressive disorder, single episode, mild: Secondary | ICD-10-CM

## 2023-10-22 DIAGNOSIS — Z7901 Long term (current) use of anticoagulants: Secondary | ICD-10-CM | POA: Diagnosis present

## 2023-10-22 DIAGNOSIS — J439 Emphysema, unspecified: Secondary | ICD-10-CM

## 2023-10-22 DIAGNOSIS — I480 Paroxysmal atrial fibrillation: Secondary | ICD-10-CM | POA: Diagnosis not present

## 2023-10-22 DIAGNOSIS — N184 Chronic kidney disease, stage 4 (severe): Secondary | ICD-10-CM

## 2023-10-22 DIAGNOSIS — E7800 Pure hypercholesterolemia, unspecified: Secondary | ICD-10-CM

## 2023-10-22 DIAGNOSIS — I5020 Unspecified systolic (congestive) heart failure: Secondary | ICD-10-CM

## 2023-10-22 DIAGNOSIS — Z0000 Encounter for general adult medical examination without abnormal findings: Secondary | ICD-10-CM

## 2023-10-22 DIAGNOSIS — K219 Gastro-esophageal reflux disease without esophagitis: Secondary | ICD-10-CM

## 2023-10-22 DIAGNOSIS — R0981 Nasal congestion: Secondary | ICD-10-CM

## 2023-10-22 LAB — POCT INR: INR: 3 (ref 2.0–3.0)

## 2023-10-22 MED ORDER — DOXYCYCLINE HYCLATE 100 MG PO TABS: 100.0000 mg | ORAL_TABLET | Freq: Two times a day (BID) | ORAL | 0 refills | Status: DC

## 2023-10-22 NOTE — Patient Instructions (Signed)
 HOLD Tomorrow only then continue taking 1 tablet Daily, except 1.5 tablets on Monday, Wednesday and Friday.  INR in 1 week.  Eat greens every other day while on Doxycycline. [phone]

## 2023-10-22 NOTE — Patient Instructions (Signed)
 Saline nasal spray - flush nose 1-2x/day  Nasacort nasal spray - 2 sprays each nostril one time per day.  Do this in the evening.

## 2023-10-22 NOTE — Progress Notes (Signed)
 Subjective:    Patient ID: Dawn Sherman, female    DOB: [birth year], 68 y.o.   MRN: [id number]  Patient here for  Chief Complaint  Patient presents with   Annual Exam    HPI With past history of CKD, hypertension, HFimpEF, afib, hypercholesterolemia, COPD and hypothyroidism - comes in today to follow up on these issues as well as for a complete physical exam. LHC 11/25/22 - normal coronary arteries, moderate pulmonary hypertension. Is s/p afib ablation. Currently being evaluated for renal transplant. Reviewed recent transplant note. Needs referral to pulmonary prior - for PFTs and reevaluation of need for overnight oxygen. Scheduled an appt with pulmonary 11/05/23. Evaluated by cardiology 09/04/23 - continues on amiodarone and coreg. Eliquis changed to xarelto - due to cost. Per note, subsequently changed to warfarin. Has been followed by Dr Smith Robert for IDA. She reports that starting 10/16/23 - developed increased nasal congestion, sinus pressure and irritated throat. Feels like her sinus infections. No chest congestion or increased cough. No increased sob. Feels her breathing is stable. No fever. No body aches. No vomiting or diarrhea. Does have alternating constipation and loose stool. Discussed taking benefiber.    Past Medical History:  Diagnosis Date   B12 deficiency    Cardiomyopathy (HCC)    a. 09/2022 Echo: EF 35-40%; b. 09/2022 MV: apical defect w/ mild peri-infarct ischemia. EF 43%.   Chronic HFrEF (heart failure with reduced ejection fraction) (HCC)    a. 01/2020 Echo: EF 30-35%; b. 07/2020 Echo: EF 50-55%; c. 06/2022 Echo: EF 50-55%; d. 09/2022 Echo: EF 35-40%, globh HK, mod reduced RV fxn, sev BAE, mod MR.   CKD (chronic kidney disease), stage IV (HCC)    COPD (chronic obstructive pulmonary disease) (HCC)    Depression    Endometriosis    Hypertension    Mixed hyperlipidemia    Moderate mitral regurgitation    Normocytic anemia    PAF (paroxysmal atrial fibrillation) (HCC)    a.  CHA2DS2VASc = 4-->eliquis 2.5 bid.   Paroxymsal Atrial flutter (HCC)    Tobacco abuse    Past Surgical History:  Procedure Laterality Date   ABDOMINAL HYSTERECTOMY     ATRIAL FIBRILLATION ABLATION N/A 08/04/2023   Procedure: ATRIAL FIBRILLATION ABLATION;  Surgeon: Lanier Prude, MD;  Location: [facility]IVE [facility];  Service: Cardiovascular;  Laterality: N/A;   BREAST BIOPSY Right 2015   neg-  FIBROADENOMA   CENTRAL LINE INSERTION  11/15/2022   Procedure: CENTRAL LINE INSERTION;  Surgeon: Yvonne Kendall, MD;  Location: [facility] INVASIVE CV LAB;  Service: Cardiovascular;;   COLONOSCOPY WITH PROPOFOL N/A 11/24/2022   Procedure: COLONOSCOPY WITH PROPOFOL;  Surgeon: Regis Bill, MD;  Location: [facility] ENDOSCOPY;  Service: Endoscopy;  Laterality: N/A;   COLONOSCOPY WITH PROPOFOL N/A 11/23/2022   Procedure: COLONOSCOPY WITH PROPOFOL;  Surgeon: Regis Bill, MD;  Location: [facility] ENDOSCOPY;  Service: Endoscopy;  Laterality: N/A;   ESOPHAGOGASTRODUODENOSCOPY (EGD) WITH PROPOFOL N/A 11/23/2022   Procedure: ESOPHAGOGASTRODUODENOSCOPY (EGD) WITH PROPOFOL;  Surgeon: Regis Bill, MD;  Location: [facility] ENDOSCOPY;  Service: Endoscopy;  Laterality: N/A;   RIGHT HEART CATH N/A 11/15/2022   Procedure: RIGHT HEART CATH;  Surgeon: Yvonne Kendall, MD;  Location: [facility] INVASIVE CV LAB;  Service: Cardiovascular;  Laterality: N/A;   RIGHT/LEFT HEART CATH AND CORONARY ANGIOGRAPHY N/A 11/25/2022   Procedure: RIGHT/LEFT HEART CATH AND CORONARY ANGIOGRAPHY;  Surgeon: Iran Ouch, MD;  Location: [facility] INVASIVE CV LAB;  Service: Cardiovascular;  Laterality: N/A;   TAH/RSO  1999   secondary to bleeding and endometriosis (Dr Haskel Khan)   TRANSESOPHAGEAL ECHOCARDIOGRAM (CATH LAB) N/A 08/04/2023   Procedure: TRANSESOPHAGEAL ECHOCARDIOGRAM;  Surgeon: Lanier Prude, MD;  Location: [facility] INVASIVE CV LAB;  Service: Cardiovascular;  Laterality: N/A;   Family History  Problem Relation Age of Onset    Hypercholesterolemia Mother    Rheum arthritis Father    Rheum arthritis Daughter    Fibromyalgia Daughter    Breast cancer Neg Hx    Colon cancer Neg Hx    Social History   Socioeconomic History   Marital status: Married    Spouse name: Not on file   Number of children: Not on file   Years of education: Not on file   Highest education level: Not on file  Occupational History   Not on file  Tobacco Use   Smoking status: Former    Current packs/day: 0.25    Average packs/day: 0.3 packs/day for 40.0 years (10.0 ttl pk-yrs)    Types: Cigarettes   Smokeless tobacco: Never   Tobacco comments:    Patient quit smoking recently   Vaping Use   Vaping status: Never Used  Substance and Sexual Activity   Alcohol use: Not Currently    Alcohol/week: 0.0 standard drinks of alcohol    Comment: occasional   Drug use: No   Sexual activity: Not Currently  Other Topics Concern   Not on file  Social History Narrative   Lives in [place] with her husband.  She is retired from [employer]	[place].  She does not routinely exercise.   Social Drivers of Corporate investment banker Strain: Low Risk  (12/12/2022)   Overall Financial Resource Strain (CARDIA)    Difficulty of Paying Living Expenses: Not hard at all  Food Insecurity: No Food Insecurity (12/12/2022)   Hunger Vital Sign    Worried About Running Out of Food in the Last Year: Never true    Ran Out of Food in the Last Year: Never true  Transportation Needs: No Transportation Needs (12/12/2022)   PRAPARE - Administrator, Civil Service (Medical): No    Lack of Transportation (Non-Medical): No  Physical Activity: Sufficiently Active (12/12/2022)   Exercise Vital Sign    Days of Exercise per Week: 7 days    Minutes of Exercise per Session: 30 min  Stress: No Stress Concern Present (12/12/2022)   [employer] of Occupational Health - Occupational Stress Questionnaire    Feeling of Stress : Not at all  Social  Connections: Unknown (12/12/2022)   Social Connection and Isolation Panel [NHANES]    Frequency of Communication with Friends and Family: Not on file    Frequency of Social Gatherings with Friends and Family: Not on file    Attends Religious Services: Not on file    Active Member of Clubs or Organizations: Not on file    Attends Banker Meetings: Not on file    Marital Status: Married     Review of Systems  Constitutional:  Negative for appetite change and unexpected weight change.  HENT:  Positive for congestion, postnasal drip and sinus pressure. Negative for sore throat.   Eyes:  Negative for pain and visual disturbance.  Respiratory:  Negative for cough and chest tightness.        Breathing stable.   Cardiovascular:  Negative for chest pain and palpitations.  Gastrointestinal:  Negative for abdominal pain, diarrhea, nausea and vomiting.  Genitourinary:  Negative for difficulty urinating  and dysuria.  Musculoskeletal:  Negative for joint swelling and myalgias.  Skin:  Negative for color change and rash.  Neurological:  Negative for dizziness and headaches.  Hematological:  Negative for adenopathy. Does not bruise/bleed easily.  Psychiatric/Behavioral:  Negative for agitation and dysphoric mood.        Objective:     BP 108/68   Pulse 84   Temp 97.9 F (36.6 C)   Resp 16   Ht 5\' 5"  (1.651 m)   Wt 122 lb 6.4 oz (55.5 kg)   SpO2 95%   BMI 20.37 kg/m  Wt Readings from Last 3 Encounters:  10/27/23 120 lb 8 oz (54.7 kg)  10/22/23 122 lb 6.4 oz (55.5 kg)  09/04/23 125 lb 3.2 oz (56.8 kg)    Physical Exam Vitals reviewed.  Constitutional:      General: She is not in acute distress.    Appearance: Normal appearance. She is well-developed.  HENT:     Head: Normocephalic and atraumatic.     Comments: Increased sinus pressure.     Right Ear: External ear normal.     Left Ear: External ear normal.     Mouth/Throat:     Pharynx: No oropharyngeal exudate or  posterior oropharyngeal erythema.  Eyes:     General: No scleral icterus.       Right eye: No discharge.        Left eye: No discharge.     Conjunctiva/sclera: Conjunctivae normal.  Neck:     Thyroid: No thyromegaly.  Cardiovascular:     Rate and Rhythm: Normal rate and regular rhythm.  Pulmonary:     Effort: No tachypnea, accessory muscle usage or respiratory distress.     Breath sounds: Normal breath sounds. No decreased breath sounds or wheezing.  Chest:  Breasts:    Right: No inverted nipple, mass, nipple discharge or tenderness (no axillary adenopathy).     Left: No inverted nipple, mass, nipple discharge or tenderness (no axilarry adenopathy).  Abdominal:     General: Bowel sounds are normal.     Palpations: Abdomen is soft.     Tenderness: There is no abdominal tenderness.  Musculoskeletal:        General: No swelling or tenderness.     Cervical back: Neck supple. No tenderness.  Lymphadenopathy:     Cervical: No cervical adenopathy.  Skin:    Findings: No erythema or rash.  Neurological:     Mental Status: She is alert and oriented to person, place, and time.  Psychiatric:        Mood and Affect: Mood normal.        Behavior: Behavior normal.         Outpatient Encounter Medications as of 10/22/2023  Medication Sig   doxycycline (VIBRA-TABS) 100 MG tablet Take 1 tablet (100 mg total) by mouth 2 (two) times daily.   acetaminophen (TYLENOL) 325 MG tablet Take 650 mg by mouth every 6 (six) hours as needed (pain.).   amiodarone (PACERONE) 200 MG tablet TAKE ONE TABLET BY MOUTH EVERY DAY   atorvastatin (LIPITOR) 40 MG tablet Take 1 tablet (40 mg total) by mouth daily.   buPROPion (WELLBUTRIN XL) 150 MG 24 hr tablet Take 1 tablet (150 mg total) by mouth daily.   calcitRIOL (ROCALTROL) 0.25 MCG capsule Take 0.25 mcg by mouth daily.   calcium carbonate (OS-CAL - DOSED IN MG OF ELEMENTAL CALCIUM) 1250 (500 Ca) MG tablet Take 1 tablet (500 mg of elemental calcium total)  by  mouth 3 (three) times daily with meals.   carvedilol (COREG) 12.5 MG tablet Take 1.5 tablets (18.75 mg total) by mouth 2 (two) times daily. (Patient taking differently: Take 12.5 mg by mouth 2 (two) times daily.)   FARXIGA 10 MG TABS tablet Take 10 mg by mouth daily.   hydrALAZINE (APRESOLINE) 25 MG tablet Take 1 tablet (25 mg total) by mouth 3 (three) times daily.   isosorbide mononitrate (IMDUR) 30 MG 24 hr tablet Take 1 tablet (30 mg total) by mouth daily.   levothyroxine (SYNTHROID) 137 MCG tablet TAKE ONE TABLET BY MOUTH EVERY DAY   loratadine (CLARITIN) 10 MG tablet Take 10 mg by mouth daily.   omeprazole (PRILOSEC) 20 MG capsule Take 1 capsule (20 mg total) by mouth daily.   Tiotropium Bromide-Olodaterol 2.5-2.5 MCG/ACT AERS Inhale 2 puffs into the lungs daily. (Patient not taking: Reported on 07/30/2023)   torsemide (DEMADEX) 20 MG tablet TAKE 60 MG ALTERNATING WITH 40 MG EVERY OTHER DAY   venlafaxine XR (EFFEXOR-XR) 150 MG 24 hr capsule TAKE ONE CAPSULE BY MOUTH EVERY DAY   warfarin (COUMADIN) 2.5 MG tablet Take 1 tablet (2.5 mg total) by mouth daily.   [DISCONTINUED] Rivaroxaban (XARELTO) 15 MG TABS tablet Take 1 tablet (15 mg total) by mouth daily with supper. (Patient not taking: Reported on 10/27/2023)   No facility-administered encounter medications on file as of 10/22/2023.     Lab Results  Component Value Date   WBC 7.7 08/04/2023   HGB 14.4 08/04/2023   HCT 46.0 08/04/2023   PLT 152 08/04/2023   GLUCOSE 100 (H) 10/27/2023   CHOL 164 01/02/2023   TRIG 64.0 01/02/2023   HDL 83.70 01/02/2023   LDLDIRECT 53.3 10/14/2012   LDLCALC 68 01/02/2023   ALT 19 10/27/2023   AST 21 10/27/2023   NA 140 10/27/2023   K 4.0 10/27/2023   CL 109 10/27/2023   CREATININE 2.93 (H) 10/27/2023   BUN 46 (H) 10/27/2023   CO2 22 10/27/2023   TSH 0.172 (L) 10/27/2023   INR 3.0 10/22/2023   HGBA1C 4.8 01/02/2023    ECHO TEE Result Date: 08/04/2023    TRANSESOPHOGEAL ECHO REPORT   Patient  Name:   Dawn Sherman Date of Exam: 08/04/2023 Medical Rec #:  [id number]     Height:       65.0 in Accession #:    [id number]    Weight:       120.0 lb Date of Birth:  1956-08-18    BSA:          1.592 m Patient Age:    67 years      BP:           135/79 mmHg Patient Gender: F             HR:           62 bpm. Exam Location:  Inpatient Procedure: Transesophageal Echo and Color Doppler Indications:     I48.91* Unspeicified atrial fibrillation  History:         Patient has prior history of Echocardiogram examinations, most                  recent 11/14/2022. CHF and Cardiomyopathy, Abnormal ECG,                  Arrythmias:Atrial Fibrillation, AV block and Tachycardia,                  Signs/Symptoms:Chest Pain;  Risk Factors:Current Smoker and                  Dyslipidemia. ETOH.  Sonographer:     Sheralyn Boatman RDCS Referring Phys:  [id number] Lanier Prude Diagnosing Phys: Arvilla Meres MD PROCEDURE: After discussion of the risks and benefits of a TEE, an informed consent was obtained from the patient. The patient was intubated. The transesophogeal probe was passed without difficulty through the esophogus of the patient. Imaged were obtained with the patient in a supine position. Sedation performed by different physician. The patient was monitored while under deep sedation. The patient's vital signs; including heart rate, blood pressure, and oxygen saturation; remained stable throughout the procedure. The patient developed no complications during the procedure. Continuous sedation prior to abalation procedure.  IMPRESSIONS  1. Left ventricular ejection fraction, by estimation, is 50 to 55%. The left ventricle has low normal function.  2. Right ventricular systolic function is normal. The right ventricular size is normal.  3. Left atrial size was mildly dilated. No left atrial/left atrial appendage thrombus was detected.  4. The mitral valve is grossly normal. Mild mitral valve regurgitation. No evidence of mitral  stenosis.  5. The aortic valve is normal in structure. Aortic valve regurgitation is trivial. No aortic stenosis is present.  6. There is mild (Grade II) plaque involving the descending aorta.  7. Continuous sedation prior to abalation procedure. FINDINGS  Left Ventricle: Left ventricular ejection fraction, by estimation, is 50 to 55%. The left ventricle has low normal function. The left ventricular internal cavity size was normal in size. Right Ventricle: The right ventricular size is normal. No increase in right ventricular wall thickness. Right ventricular systolic function is normal. Left Atrium: Left atrial size was mildly dilated. No left atrial/left atrial appendage thrombus was detected. Right Atrium: Right atrial size was normal in size. Pericardium: There is no evidence of pericardial effusion. Mitral Valve: The mitral valve is grossly normal. Mild mitral valve regurgitation. No evidence of mitral valve stenosis. Tricuspid Valve: The tricuspid valve is grossly normal. Tricuspid valve regurgitation is not demonstrated. Aortic Valve: The aortic valve is normal in structure. Aortic valve regurgitation is trivial. No aortic stenosis is present. Pulmonic Valve: The pulmonic valve was grossly normal. Pulmonic valve regurgitation is trivial. Aorta: The aortic root and ascending aorta are structurally normal, with no evidence of dilitation. There is mild (Grade II) plaque involving the descending aorta. IAS/Shunts: The interatrial septum appears to be lipomatous. No atrial level shunt detected by color flow Doppler. Arvilla Meres MD Electronically signed by Arvilla Meres MD Signature Date/Time: 08/04/2023/11:40:25 AM    Final    EP STUDY Result Date: 08/04/2023 CONCLUSIONS: 1. Successful PVI 2. Successful ablation/isolation of the posterior wall 3. Successful ablation of the cavotricuspid isthmus for typical atrial flutter 4. Intracardiac echo reveals trivial pericardial effusion, small right inferior  pulmonary vein 5. No early apparent complications. 6. Colchicine 0.6mg  PO BID x 5 days 7. Protonix 40mg  PO daily x 45 days       Assessment & Plan:  Health care maintenance Assessment & Plan: Physical today 10/22/23.  Colonoscopy 12/2018 - normal.  Recommended f/u in 10 years. Due this year.  PAP 02/2019 - negative with negative HPV.  Mammogram 06/25/23 - Birads I.    Paroxysmal atrial fibrillation (HCC) Assessment & Plan: Left heart catheterization 11/25/2022 revealed normal coronary arteries, moderate pulmonary hypertension.  Is status post A-fib ablation.  Evaluated by cardiology 09/04/2023 and recommendation was to continue on amiodarone  and Coreg.  Eliquis changed to Xarelto secondary to cost.  Overall currently feels things are stable.  Denies any chest pain. Addendum - eliquis and xarelto costly - changed to warfarin.    Hypothyroidism, unspecified type Assessment & Plan: Continues on Synthroid.  Also on amiodarone.  Follow TSH.   Primary hypertension Assessment & Plan: Has been on fark [employer] and we bildil.  Also on Coreg.  Blood pressure as outlined.  Hold on making any changes.  Follow pressures.  Follow metabolic panel.   Hypercholesterolemia Assessment & Plan: Continue Lipitor.  Low-cholesterol diet.  Follow lipid panel liver function testing. Lab Results  Component Value Date   CHOL 164 01/02/2023   HDL 83.70 01/02/2023   LDLCALC 68 01/02/2023   LDLDIRECT 53.3 10/14/2012   TRIG 64.0 01/02/2023   CHOLHDL 2 01/02/2023      HFrEF (heart failure with reduced ejection fraction) (HCC) Assessment & Plan: Left heart catheterization with normal coronaries as outlined.  No evidence of volume overload on exam.  Continues on Farxiga, BiDil, and Coreg.   Gastroesophageal reflux disease without esophagitis Assessment & Plan: No upper symptoms reported.  On Prilosec.   Essential hypertension Assessment & Plan: Continues on carvedilol, hydralazine, Imdur and torsemide as  directed.  Also on Farxiga.  Blood pressure as outlined.  Follow pressures.  Follow metabolic panel.   Dilated cardiomyopathy [facility]) Assessment & Plan: Cath in 4/24 showed no significant coronary disease.  Cardiac MRI in 8/24 showed improvement; LV EF 47%, no myocardial delayed enhancement, normal RV size, RV EF 59%, mild MR. Recommended continuing torsemide 60mg  alternating with 40mg . She reports she is taking 40mg  bid. Continue farxiga and hydralazine.  Also on coreg and imdur.  Currently doing well.  Continue current medication regimen.   Depression, major, single episode, mild (HCC) Assessment & Plan: Continue Effexor and Wellbutrin.  Has good support.  Follow.   Systolic congestive heart failure, unspecified HF chronicity (HCC) Assessment & Plan: Cardiac MRI in 8/24 showed improvement; LV EF 47%, no myocardial delayed enhancement, normal RV size, RV EF 59%, mild MR. Recommended continuing torsemide. Continue farxiga and hydralazine.  Also on imdur.  Doing well on current medication regimen.  No increased sob.  No evidence of volume overload on exam.  Follow for any changes.    CKD (chronic kidney disease), stage IV (HCC) Assessment & Plan: Currently being evaluated for renal transplant. Reviewed recent transplant note. Needs referral to pulmonary prior - for PFTs and reevaluation of need for overnight oxygen. Scheduled an appt with pulmonary 11/05/23.   Pulmonary emphysema, unspecified emphysema type (HCC) Assessment & Plan: No increased cough or congestion. Breathing stable. Follow.    Head congestion Assessment & Plan: With head congestion and cough as outlined. Treat with doxycycline as directed. Saline nasal spray/steroid nasal spray. Follow.  Call with update.    Other orders -     Doxycycline Hyclate; Take 1 tablet (100 mg total) by mouth 2 (two) times daily.  Dispense: 14 tablet; Refill: 0     [place], MD

## 2023-10-22 NOTE — Assessment & Plan Note (Signed)
 Physical today 10/22/23.  Colonoscopy 12/2018 - normal.  Recommended f/u in 10 years. Due this year.  PAP 02/2019 - negative with negative HPV.  Mammogram 06/25/23 - Birads I.

## 2023-10-23 ENCOUNTER — Ambulatory Visit: Payer: Self-pay

## 2023-10-23 NOTE — Patient Outreach (Signed)
 Care Coordination   Follow Up Visit Note   10/23/2023 Name: Dawn Sherman MRN: [id number] DOB: 14-Aug-1956  Dawn Sherman is a 68 y.o. year old female who sees Dale Eastman, MD for primary care. I spoke with  Dawn Sherman by phone today.  What matters to the patients health and wellness today?  Patient reports having a follow up visit with her primary care provider on  10/23/23.  She states she was prescribed an antibiotic due to a sinus infection.  Patient denies having any HF, atrial fibrillation symptoms.   She states her Sharyne Peach is scheduled for 11/18/23.  Patient reports current weight is 122 lbs and blood pressures are ranging from 100-120's/60-80's.  Patient states her blood thinner has changed to coumadin because she couldn't afford xeralto.     Goals Addressed             This Visit's Progress    Continued improvement post hospitalization and management/ education of health conditions.       Interventions Today    Flowsheet Row Most Recent Value  Chronic Disease   Chronic disease during today's visit Congestive Heart Failure (CHF), Hypertension (HTN), Atrial Fibrillation (AFib)  General Interventions   General Interventions Discussed/Reviewed General Interventions Reviewed, Doctor Visits  [evaluation of current treatment plan for listed health conditions and patients adherence to plan as established by provider. Assessed for HF/ Atrial fibrillation symptoms. Assessed BP readings and weight.]  Doctor Visits Discussed/Reviewed Doctor Visits Reviewed  [name] upcomingp provider visits. Advised to keep follow up visits with provides as recommended. Confirmed date for watchman.]  Education Interventions   Education Provided Provided Education  [Reviewed HF/ atrial fibrillation symptoms. Advised ongoing monitoring of BP at least 2-3 days per week. Reviewed sign/s symptoms of bleeding due to being on blood thinner. Advised to call provider for HF/ Atrial fibrillation or bleeding  symptoms.]  Provided Verbal Education On Other  [Advised to call 911 for severe symptoms. Assessed for weight. Advised to notify provider for ongoing cold like symptoms.]  Pharmacy Interventions   Pharmacy Dicussed/Reviewed Pharmacy Topics Reviewed  [medication reviewed and compliance discussed. Discussed medication change to coumadin. Advised to take antibiotic until completed.]                SDOH assessments and interventions completed:  Yes     Care Coordination Interventions:  No, not indicated   Follow up plan: Follow up call scheduled for 11/25/23 at 2 pm    Encounter Outcome:  Patient Visit Completed   [name] RN, BSN, CCM [place]  [facility], Population Health Case Manager Phone: [phone]

## 2023-10-23 NOTE — Patient Instructions (Signed)
 Visit Information  Thank you for taking time to visit with me today. Please don't hesitate to contact me if I can be of assistance to you.   Following are the goals we discussed today:   Goals Addressed             This Visit's Progress    Continued improvement post hospitalization and management/ education of health conditions.       Interventions Today    Flowsheet Row Most Recent Value  Chronic Disease   Chronic disease during today's visit Congestive Heart Failure (CHF), Hypertension (HTN), Atrial Fibrillation (AFib)  General Interventions   General Interventions Discussed/Reviewed General Interventions Reviewed, Doctor Visits  [evaluation of current treatment plan for listed health conditions and patients adherence to plan as established by provider. Assessed for HF/ Atrial fibrillation symptoms. Assessed BP readings and weight.]  Doctor Visits Discussed/Reviewed Doctor Visits Reviewed  [name] upcomingp provider visits. Advised to keep follow up visits with provides as recommended. Confirmed date for watchman.]  Education Interventions   Education Provided Provided Education  [Reviewed HF/ atrial fibrillation symptoms. Advised ongoing monitoring of BP at least 2-3 days per week. Reviewed sign/s symptoms of bleeding due to being on blood thinner. Advised to call provider for HF/ Atrial fibrillation or bleeding symptoms.]  Provided Verbal Education On Other  [Advised to call 911 for severe symptoms. Assessed for weight. Advised to notify provider for ongoing cold like symptoms.]  Pharmacy Interventions   Pharmacy Dicussed/Reviewed Pharmacy Topics Reviewed  [medication reviewed and compliance discussed. Discussed medication change to coumadin. Advised to take antibiotic until completed.]                Our next appointment is by telephone on 12/14/23  at 2 pm  Please call the care guide team at [phone] if you need to cancel or reschedule your appointment.   If you  are experiencing a Mental Health or Behavioral Health Crisis or need someone to talk to, please call the Suicide and Crisis Lifeline: 988 call 1-800-273-TALK (toll free, 24 hour hotline)  The patient verbalized understanding of instructions, educational materials, and care plan provided today and DECLINED offer to receive copy of patient instructions, educational materials, and care plan.   [name] RN, BSN, CCM [employer], Population Health Case Manager Phone: [phone]

## 2023-10-24 ENCOUNTER — Telehealth: Payer: Self-pay | Admitting: Cardiology

## 2023-10-24 NOTE — Telephone Encounter (Signed)
 Pt confirmed appt for 10/27/23

## 2023-10-26 ENCOUNTER — Encounter: Payer: Self-pay | Admitting: Internal Medicine

## 2023-10-26 ENCOUNTER — Other Ambulatory Visit: Payer: Self-pay | Admitting: Internal Medicine

## 2023-10-26 DIAGNOSIS — R0981 Nasal congestion: Secondary | ICD-10-CM | POA: Insufficient documentation

## 2023-10-26 NOTE — Assessment & Plan Note (Signed)
 Currently being evaluated for renal transplant. Reviewed recent transplant note. Needs referral to pulmonary prior - for PFTs and reevaluation of need for overnight oxygen. Scheduled an appt with pulmonary 11/05/23.

## 2023-10-26 NOTE — Assessment & Plan Note (Addendum)
 Has been on fark [employer] and we bildil.  Also on Coreg.  Blood pressure as outlined.  Hold on making any changes.  Follow pressures.  Follow metabolic panel.

## 2023-10-26 NOTE — Assessment & Plan Note (Signed)
 Cath in 4/24 showed no significant coronary disease.  Cardiac MRI in 8/24 showed improvement; LV EF 47%, no myocardial delayed enhancement, normal RV size, RV EF 59%, mild MR. Recommended continuing torsemide 60mg  alternating with 40mg . She reports she is taking 40mg  bid. Continue farxiga and hydralazine.  Also on coreg and imdur.  Currently doing well.  Continue current medication regimen.

## 2023-10-26 NOTE — Assessment & Plan Note (Signed)
 Left heart catheterization 11/25/2022 revealed normal coronary arteries, moderate pulmonary hypertension.  Is status post A-fib ablation.  Evaluated by cardiology 09/04/2023 and recommendation was to continue on amiodarone and Coreg.  Eliquis changed to Xarelto secondary to cost.  Overall currently feels things are stable.  Denies any chest pain. Addendum - eliquis and xarelto costly - changed to warfarin.

## 2023-10-26 NOTE — Assessment & Plan Note (Signed)
 With head congestion and cough as outlined. Treat with doxycycline as directed. Saline nasal spray/steroid nasal spray. Follow.  Call with update.

## 2023-10-26 NOTE — Assessment & Plan Note (Signed)
 Continue Effexor and Wellbutrin.  Has good support.  Follow.

## 2023-10-26 NOTE — Assessment & Plan Note (Signed)
 Left heart catheterization with normal coronaries as outlined.  No evidence of volume overload on exam.  Continues on Farxiga, BiDil, and Coreg.

## 2023-10-26 NOTE — Assessment & Plan Note (Signed)
 Continues on carvedilol, hydralazine, Imdur and torsemide as directed.  Also on Farxiga.  Blood pressure as outlined.  Follow pressures.  Follow metabolic panel.

## 2023-10-26 NOTE — Assessment & Plan Note (Signed)
 Cardiac MRI in 8/24 showed improvement; LV EF 47%, no myocardial delayed enhancement, normal RV size, RV EF 59%, mild MR. Recommended continuing torsemide. Continue farxiga and hydralazine.  Also on imdur.  Doing well on current medication regimen.  No increased sob.  No evidence of volume overload on exam.  Follow for any changes.

## 2023-10-26 NOTE — Assessment & Plan Note (Signed)
 Continue Lipitor.  Low-cholesterol diet.  Follow lipid panel liver function testing. Lab Results  Component Value Date   CHOL 164 01/02/2023   HDL 83.70 01/02/2023   LDLCALC 68 01/02/2023   LDLDIRECT 53.3 10/14/2012   TRIG 64.0 01/02/2023   CHOLHDL 2 01/02/2023

## 2023-10-26 NOTE — Assessment & Plan Note (Signed)
 No increased cough or congestion. Breathing stable. Follow.

## 2023-10-26 NOTE — Assessment & Plan Note (Signed)
 No upper symptoms reported.  On Prilosec.

## 2023-10-26 NOTE — Assessment & Plan Note (Signed)
 Continues on Synthroid.  Also on amiodarone.  Follow TSH.

## 2023-10-27 ENCOUNTER — Other Ambulatory Visit
Admission: RE | Admit: 2023-10-27 | Discharge: 2023-10-27 | Disposition: A | Discharge status: Home / Self Care | Source: Ambulatory Visit | Attending: Cardiology | Admitting: Cardiology

## 2023-10-27 ENCOUNTER — Ambulatory Visit (HOSPITAL_BASED_OUTPATIENT_CLINIC_OR_DEPARTMENT_OTHER): Payer: Medicare Other | Admitting: Cardiology

## 2023-10-27 ENCOUNTER — Encounter: Payer: Self-pay | Admitting: Cardiology

## 2023-10-27 VITALS — BP 136/72 | HR 80 | Wt 120.5 lb

## 2023-10-27 DIAGNOSIS — I130 Hypertensive heart and chronic kidney disease with heart failure and stage 1 through stage 4 chronic kidney disease, or unspecified chronic kidney disease: Secondary | ICD-10-CM | POA: Diagnosis not present

## 2023-10-27 DIAGNOSIS — I480 Paroxysmal atrial fibrillation: Secondary | ICD-10-CM | POA: Insufficient documentation

## 2023-10-27 DIAGNOSIS — J449 Chronic obstructive pulmonary disease, unspecified: Secondary | ICD-10-CM | POA: Diagnosis not present

## 2023-10-27 DIAGNOSIS — Z79890 Hormone replacement therapy (postmenopausal): Secondary | ICD-10-CM | POA: Insufficient documentation

## 2023-10-27 DIAGNOSIS — N184 Chronic kidney disease, stage 4 (severe): Secondary | ICD-10-CM | POA: Insufficient documentation

## 2023-10-27 DIAGNOSIS — I4892 Unspecified atrial flutter: Secondary | ICD-10-CM | POA: Insufficient documentation

## 2023-10-27 DIAGNOSIS — Z7901 Long term (current) use of anticoagulants: Secondary | ICD-10-CM | POA: Insufficient documentation

## 2023-10-27 DIAGNOSIS — E039 Hypothyroidism, unspecified: Secondary | ICD-10-CM | POA: Insufficient documentation

## 2023-10-27 DIAGNOSIS — Z87891 Personal history of nicotine dependence: Secondary | ICD-10-CM | POA: Insufficient documentation

## 2023-10-27 DIAGNOSIS — I081 Rheumatic disorders of both mitral and tricuspid valves: Secondary | ICD-10-CM | POA: Insufficient documentation

## 2023-10-27 DIAGNOSIS — I428 Other cardiomyopathies: Secondary | ICD-10-CM | POA: Diagnosis not present

## 2023-10-27 DIAGNOSIS — E785 Hyperlipidemia, unspecified: Secondary | ICD-10-CM | POA: Insufficient documentation

## 2023-10-27 DIAGNOSIS — I5022 Chronic systolic (congestive) heart failure: Secondary | ICD-10-CM | POA: Diagnosis present

## 2023-10-27 DIAGNOSIS — Z79899 Other long term (current) drug therapy: Secondary | ICD-10-CM | POA: Diagnosis not present

## 2023-10-27 LAB — COMPREHENSIVE METABOLIC PANEL
ALT: 19 U/L (ref 0–44)
AST: 21 U/L (ref 15–41)
Albumin: 3.9 g/dL (ref 3.5–5.0)
Alkaline Phosphatase: 68 U/L (ref 38–126)
Anion gap: 9 (ref 5–15)
BUN: 46 mg/dL — ABNORMAL HIGH (ref 8–23)
CO2: 22 mmol/L (ref 22–32)
Calcium: 9.3 mg/dL (ref 8.9–10.3)
Chloride: 109 mmol/L (ref 98–111)
Creatinine, Ser: 2.93 mg/dL — ABNORMAL HIGH (ref 0.44–1.00)
GFR, Estimated: 17 mL/min — ABNORMAL LOW (ref >60)
Glucose, Bld: 100 mg/dL — ABNORMAL HIGH (ref 70–99)
Potassium: 4 mmol/L (ref 3.5–5.1)
Sodium: 140 mmol/L (ref 135–145)
Total Bilirubin: 0.7 mg/dL (ref 0.0–1.2)
Total Protein: 7.3 g/dL (ref 6.5–8.1)

## 2023-10-27 LAB — TSH: TSH: 0.172 u[IU]/mL — ABNORMAL LOW (ref 0.350–4.500)

## 2023-10-27 NOTE — Patient Instructions (Addendum)
 There has been no changes to your medications   Go DOWN to LOWER LEVEL (LL) to have your blood work completed inside of [place] office.  We will only call you if the results are abnormal or if the provider would like to make medication changes.  Your physician has requested that you have an echocardiogram. Echocardiography is a painless test that uses sound waves to create images of your heart. It provides your doctor with information about the size and shape of your heart and how well your heart's chambers and valves are working. This procedure takes approximately one hour. There are no restrictions for this procedure. Please do NOT wear cologne, perfume, aftershave, or lotions (deodorant is allowed). Please arrive 15 minutes prior to your appointment time.  Please note: We ask at that you not bring children with you during ultrasound (echo/ vascular) testing. Due to room size and safety concerns, children are not allowed in the ultrasound rooms during exams. Our front office staff cannot provide observation of children in our lobby area while testing is being conducted. An adult accompanying a patient to their appointment will only be allowed in the ultrasound room at the discretion of the ultrasound technician under special circumstances. We apologize for any inconvenience.  Your physician recommends that you schedule a follow-up appointment in: 3 months (June) ** PLEASE CALL THE OFFICE IN APRIL TO ARRANGE YOUR FOLLOW UP APPOINTMENT.**  If you have any questions or concerns before your next appointment please send [place] a message through mychart or call our office at [phone]  At the [facility], you and your health needs are our priority. As part of our continuing mission to provide you with exceptional heart care, we have created designated Provider Care Teams. These Care Teams include your primary Cardiologist (physician) and Advanced Practice Providers (APPs- Physician  Assistants and Nurse Practitioners) who all work together to provide you with the care you need, when you need it.   You may see any of the following providers on your designated Care Team at your next follow up: Dr Arvilla Meres Dr Marca Ancona Dr. Dorthula Nettles Dr. Clearnce Hasten Amy Filbert Schilder, NP Robbie Lis, [place] [facility] [place], [place] Brynda Peon, NP [place] Lee, NP Clarisa Kindred, NP Karle Plumber, PharmD Enos Fling, PharmD   Please be sure to bring in all your medications bottles to every appointment.    Thank you for choosing [facility]

## 2023-10-27 NOTE — Progress Notes (Signed)
 PCP: Dale Mariemont, MD HF Cardiology: Dr. Gala Romney  Dawn Sherman is a 68 y.o. female with a history of COPD, prior tobacco abuse, paroxysmal atrial fibrillation and flutter, mitral regurgitation, hypertension, hyperlipidemia, stage IV chronic kidney disease, and systolic HF.     HF diagnosed in 6/21. Echo EF 30-35% in setting of new-onset AF.  Repeat echo in December 2021 showed improvement in EF to 50-55% with grade 1 diastolic dysfunction.  In April 2023, in the context of GI illness and weakness, she suffered a fall was found to have a subarachnoid hemorrhage.  Oral anticoagulation was initially held but then subsequently resumed once cleared by neurosurgery.     She required admissions in July 2023 and again in November 2023 in the setting of respiratory failure and volume overload.  Echo November 2023 showed an EF of 50 to 55%.  Unfortunately, she was readmitted in January 2024 with heart failure and pneumonia.  Repeat echo 09/28/22, which was performed during an episode of atrial flutter, showed an EF of 35 to 40%.  Following diuresis, she was discharged home.  Stress test performed in the outpatient setting was intermediate in the setting of an EF of 43% with apical infarct and mild peri-infarct ischemia.  No cath at that time due to CKD 4.    Admitted 11/14/22 with progressive weight gain and SOB. Labs showed acute on chronic normocytic anemia with an H&H of 7.1 and 23.9.  BNP was elevated at 3619.9.  Creatinine stable at 2.76 however, potassium was elevated at 5.5. CXR with vascular congestion and small bilateral pleural effusions.  ECG with NSR. Hstrop 9 and 10.  Echo 11/14/22 with EF 25-30% with global Hk, RV moderately down. Severe MR, mod-severe TR.  LHC/RHC showed normal coronaries, normal filling pressures, and preserved CO.   Cardiac MRI in 8/24 with LV EF 47%, no myocardial delayed enhancement, normal RV size, RV EF 59%, mild MR (improved).    She had atrial fibrillation ablation in  12/24.  TEE at that time showed EF 50-55%, normal RV, mild MR.   Patient returns today for followup of CHF.  She is in NSR today, no palpitations.  She was unable to get coverage for Eliquis or Xarelto so is on warfarin.  No significant exertional dyspnea or chest pain.  She is no longer smoking. No orthopnea/PND.  No lightheadedness.      Labs (4/24): TSH 165, K 5.2, creatinine 2.87 Labs (6/24): K 4.2, creatinine 2.44, BNP 1023, myeloma panel negative, hgb 12.7 Labs (7/24): TSH 13 (coming down) Labs (11/24): K 5.1, creatinine 2.83, hgb 14.4 Labs (12/24): creatinine 2.7  ECG (personally reviewed): NSR, LVH   PMH: 1. Atrial fibrillation: Paroxysmal - Ablation 12/24 2. COPD: Prior smoker.  3. HTN 4. Hyperlipidemia 5. CKD stage 4 6. Subarachnoid hemorrhage: 4/23, traumatic after fall.  7. Chronic systolic CHF: Nonischemic cardiomyopathy.  - Echo (6/21): EF 30-35% in setting of AF/RVR. - Echo (12/21): EF 50-55% - Echo (11/23): EF 50-55%, normal RV, mild AS - Echo (3/24): EF 25-30%, moderate RV dysfunction, severe MR, moderate-severe TR.  - LHC/RHC (4/24): Normal coronaries; mean RA 6, PA 50/17 mean 30, mean PCWP 16, CI 3.7.  - Cardiac MRI (8/24): LV EF 47%, no myocardial delayed enhancement, normal RV size, RV EF 59%, mild MR (improved).  - TEE (12/24): EF 50-55%, normal RV, mild MR 8. Anemia: Negative endoscopies 9. Hypothyroidism  Social History   Socioeconomic History   Marital status: Married    Spouse name: Not  on file   Number of children: Not on file   Years of education: Not on file   Highest education level: Not on file  Occupational History   Not on file  Tobacco Use   Smoking status: Former    Current packs/day: 0.25    Average packs/day: 0.3 packs/day for 40.0 years (10.0 ttl pk-yrs)    Types: Cigarettes   Smokeless tobacco: Never   Tobacco comments:    Patient quit smoking recently   Vaping Use   Vaping status: Never Used  Substance and Sexual Activity    Alcohol use: Not Currently    Alcohol/week: 0.0 standard drinks of alcohol    Comment: occasional   Drug use: No   Sexual activity: Not Currently  Other Topics Concern   Not on file  Social History Narrative   Lives in [place] with her husband.  She is retired from [employer]	[place].  She does not routinely exercise.   Social Drivers of Corporate investment banker Strain: Low Risk  (12/12/2022)   Overall Financial Resource Strain (CARDIA)    Difficulty of Paying Living Expenses: Not hard at all  Food Insecurity: No Food Insecurity (12/12/2022)   Hunger Vital Sign    Worried About Running Out of Food in the Last Year: Never true    Ran Out of Food in the Last Year: Never true  Transportation Needs: No Transportation Needs (12/12/2022)   PRAPARE - Administrator, Civil Service (Medical): No    Lack of Transportation (Non-Medical): No  Physical Activity: Sufficiently Active (12/12/2022)   Exercise Vital Sign    Days of Exercise per Week: 7 days    Minutes of Exercise per Session: 30 min  Stress: No Stress Concern Present (12/12/2022)   [employer] of Occupational Health - Occupational Stress Questionnaire    Feeling of Stress : Not at all  Social Connections: Unknown (12/12/2022)   Social Connection and Isolation Panel [NHANES]    Frequency of Communication with Friends and Family: Not on file    Frequency of Social Gatherings with Friends and Family: Not on file    Attends Religious Services: Not on file    Active Member of Clubs or Organizations: Not on file    Attends Banker Meetings: Not on file    Marital Status: Married  Intimate Partner Violence: Not At Risk (12/12/2022)   Humiliation, Afraid, Rape, and Kick questionnaire    Fear of Current or Ex-Partner: No    Emotionally Abused: No    Physically Abused: No    Sexually Abused: No   Family History  Problem Relation Age of Onset   Hypercholesterolemia Mother    Rheum arthritis Father     Rheum arthritis Daughter    Fibromyalgia Daughter    Breast cancer Neg Hx    Colon cancer Neg Hx    ROS: All systems reviewed and negative except as per HPI.   Current Outpatient Medications  Medication Sig Dispense Refill   acetaminophen (TYLENOL) 325 MG tablet Take 650 mg by mouth every 6 (six) hours as needed (pain.).     amiodarone (PACERONE) 200 MG tablet TAKE ONE TABLET BY MOUTH EVERY DAY 30 tablet 0   atorvastatin (LIPITOR) 40 MG tablet Take 1 tablet (40 mg total) by mouth daily. 90 tablet 1   buPROPion (WELLBUTRIN XL) 150 MG 24 hr tablet Take 1 tablet (150 mg total) by mouth daily. 90 tablet 1   calcitRIOL (ROCALTROL) 0.25  MCG capsule Take 0.25 mcg by mouth daily.     calcium carbonate (OS-CAL - DOSED IN MG OF ELEMENTAL CALCIUM) 1250 (500 Ca) MG tablet Take 1 tablet (500 mg of elemental calcium total) by mouth 3 (three) times daily with meals. 60 tablet 1   carvedilol (COREG) 12.5 MG tablet Take 1.5 tablets (18.75 mg total) by mouth 2 (two) times daily. (Patient taking differently: Take 12.5 mg by mouth 2 (two) times daily.) 90 tablet 3   doxycycline (VIBRA-TABS) 100 MG tablet Take 1 tablet (100 mg total) by mouth 2 (two) times daily. 14 tablet 0   FARXIGA 10 MG TABS tablet Take 10 mg by mouth daily.     hydrALAZINE (APRESOLINE) 25 MG tablet Take 1 tablet (25 mg total) by mouth 3 (three) times daily. 90 tablet 5   isosorbide mononitrate (IMDUR) 30 MG 24 hr tablet Take 1 tablet (30 mg total) by mouth daily. 90 tablet 3   levothyroxine (SYNTHROID) 137 MCG tablet TAKE ONE TABLET BY MOUTH EVERY DAY 90 tablet 1   loratadine (CLARITIN) 10 MG tablet Take 10 mg by mouth daily.     omeprazole (PRILOSEC) 20 MG capsule Take 1 capsule (20 mg total) by mouth daily.     torsemide (DEMADEX) 20 MG tablet TAKE 60 MG ALTERNATING WITH 40 MG EVERY OTHER DAY 270 tablet 3   venlafaxine XR (EFFEXOR-XR) 150 MG 24 hr capsule TAKE ONE CAPSULE BY MOUTH EVERY DAY 90 capsule 1   warfarin (COUMADIN) 2.5 MG  tablet Take 1 tablet (2.5 mg total) by mouth daily. 30 tablet 1   Tiotropium Bromide-Olodaterol 2.5-2.5 MCG/ACT AERS Inhale 2 puffs into the lungs daily. (Patient not taking: Reported on 07/30/2023) 4 g 3   No current facility-administered medications for this visit.   BP 136/72 (BP Location: Left Arm, Patient Position: Sitting, Cuff Size: Normal)   Pulse 80   Wt 120 lb 8 oz (54.7 kg)   SpO2 95%   BMI 20.05 kg/m  General: NAD Neck: No JVD, no thyromegaly or thyroid nodule.  Lungs: Occasional rhonchi.  CV: Nondisplaced PMI.  Heart regular S1/S2, no S3/S4, no murmur.  No peripheral edema.  No carotid bruit.  Normal pedal pulses.  Abdomen: Soft, nontender, no hepatosplenomegaly, no distention.  Skin: Intact without lesions or rashes.  Neurologic: Alert and oriented x 3.  Psych: Normal affect. Extremities: No clubbing or cyanosis.  HEENT: Normal.   Assessment/Plan: 1. Chronic systolic CHF: Nonischemic cardiomyopathy.  EF has fluctuated with atrial fibrillation in the past, but echo in 3/24 showed EF 25-30%, moderate RV dysfunction, severe MR, moderate-severe TR in the setting of NSR.  Cath in 4/24 showed no significant coronary disease.  Cardiac MRI in 8/24 showed improvement; LV EF 47%, no myocardial delayed enhancement, normal RV size, RV EF 59%, mild MR. TEE in 12/24 showed EF 50-55% with normal RV.  Cause of cardiomyopathy is uncertain, may be tachycardia-mediated cardiomyopathy though EF has not completely returned to normal in NSR. TSH was markedly high until Levoxyl started, hypothyroidism may play a role.  No h/o ETOH/drugs, no strong family history of cardiomyopathy.  NYHA class I-II, not volume overloaded on exam.  - Continue torsemide 60 mg daily alternating with 40 mg daily.  BMET today.   - Continue Farxiga 10 mg daily.  - Continue hydralazine 25 mg tid and Imdur 30 mg daily.  - Continue Coreg 12.5 mg bid.  - No ARB/ACEI/ARNI/spironolactone with CKD stage IV.  - Repeat echo will  be arranged.  2. CKD stage IV: Follow BMET carefully, check today.  3. Atrial fibrillation: Paroxysmal.  She appears to be holding NSR on amiodarone, now post-AF ablation.  - Continue amiodarone 200 mg daily.  Check LFTs/TSH.  She is on treatment for hypothyroidism.  Needs regular eye exam.  Now that she has had ablation, should eventually be able to stop amiodarone (next appt if she remains in NSR).  - She is planned for a Watchman.  Continue warfarin for now.  4. COPD: She is staying off cigarettes.  5. Hypothyroidism: ?If hypothyroidism played a role in cardiomyopathy as EF has also improved.    - Continue Levothyroxine directed by PCP.  6. HTN: Associated with right renal atrophy on prior renal US.  7. Valvular heart disease: Severe MR and mod-severe TR on 3/24 echo.  Cardiac MRI showed only mild mitral regurgitation in setting of improved LV and RV function, and TEE in 12/24 showed only mild MR.  She does not have a prominent murmur.  - Continue to follow, will repeat echo ordered to reassess.  Followup 3 months.    I spent 31 minutes reviewing records, interviewing/examining patient, and managing orders.   Marca Ancona 10/27/2023

## 2023-10-28 ENCOUNTER — Inpatient Hospital Stay (HOSPITAL_BASED_OUTPATIENT_CLINIC_OR_DEPARTMENT_OTHER): Payer: Medicare Other | Admitting: Oncology

## 2023-10-28 ENCOUNTER — Inpatient Hospital Stay: Payer: Medicare Other | Attending: Oncology

## 2023-10-28 ENCOUNTER — Encounter: Payer: Self-pay | Admitting: Oncology

## 2023-10-28 ENCOUNTER — Telehealth: Payer: Self-pay

## 2023-10-28 VITALS — BP 128/68 | HR 81 | Temp 97.1°F | Resp 16 | Wt 120.9 lb

## 2023-10-28 DIAGNOSIS — D631 Anemia in chronic kidney disease: Secondary | ICD-10-CM

## 2023-10-28 DIAGNOSIS — Z79899 Other long term (current) drug therapy: Secondary | ICD-10-CM | POA: Insufficient documentation

## 2023-10-28 DIAGNOSIS — D509 Iron deficiency anemia, unspecified: Secondary | ICD-10-CM | POA: Diagnosis present

## 2023-10-28 LAB — IRON AND TIBC
Iron: 82 ug/dL (ref 28–170)
Saturation Ratios: 24 % (ref 10.4–31.8)
TIBC: 340 ug/dL (ref 250–450)
UIBC: 258 ug/dL

## 2023-10-28 LAB — CBC
HCT: 41.5 % (ref 36.0–46.0)
Hemoglobin: 13.5 g/dL (ref 12.0–15.0)
MCH: 30.4 pg (ref 26.0–34.0)
MCHC: 32.5 g/dL (ref 30.0–36.0)
MCV: 93.5 fL (ref 80.0–100.0)
Platelets: 234 10*3/uL (ref 150–400)
RBC: 4.44 MIL/uL (ref 3.87–5.11)
RDW: 13.7 % (ref 11.5–15.5)
WBC: 7.7 10*3/uL (ref 4.0–10.5)
nRBC: 0 % (ref 0.0–0.2)

## 2023-10-28 LAB — FERRITIN: Ferritin: 25 ng/mL (ref 11–307)

## 2023-10-28 NOTE — Telephone Encounter (Signed)
 Patient agrees to proceed with IV iron, will await from call from scheduler. Asks can she come in tomorrow

## 2023-10-28 NOTE — Progress Notes (Signed)
 Hematology/Oncology Consult note [facility]  [phone] [phone]  Patient Care Team: Dale Ione, MD as PCP - General (Internal Medicine) Antonieta Iba, MD as PCP - Cardiology (Cardiology) Lanier Prude, MD as PCP - Electrophysiology (Cardiology) Lemar Livings Merrily Pew, MD as Consulting Physician (General Surgery) Otho Ket, RN as [facility] Care Management   Name of the patient: Dawn Sherman  [id number]  1956/01/17   Date of visit: 10/28/23  Diagnosis-iron deficiency anemia with a component of anemia of chronic kidney disease  Chief complaint/ Reason for visit-routine follow-up of anemia  Heme/Onc history: Patient is a 68 year old female with a past medical history significant for hypertension, A-fib presently on Coumadin , CKD who was admitted to the ER after she had a fall.  Patient experienced some generalized weakness and dizziness prior to the fall and felt like she blacked out and fell to the floor hitting the left side of her head and face.  She had a CT head without contrast which showed acute bilateral subarachnoid hemorrhages predominantly along the left sylvian fissure and multiple small foci in the left frontal lobe.  Linear focus of subarachnoid hemorrhage in the right temporal lobe and right frontal lobe.  No significant edema or midline shift.  Subsequent MRI angio head without contrast showed normal intracranial MRA.  Patient was seen by neurosurgery and no surgical intervention is recommended.Platelet counts dropped down to 28 in April 2023 during that hospitalization. Smear review and LDH not indicated of TTP.  No evidence of DIC.  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  HIV hepatitis B hepatitis C and stool H. 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   Patient has history of heart failure with an EF of 25 to 30% with global hypokinesis.  She follows up with cardiology for the same.  She also has CKD stage IV.  She is on low-dose Eliquis presently for her paroxysmal A-fib.   Patient last seen by me in May 2023 and now referred for anemia.CBC from April 2024 showed H&H of 9.1/28.5 with an MCV of 85.  Ferritin levels were normal at 112 with an iron saturation of 11%.  B12 levels were normal at 520.  TSH elevated during hospitalization at 165.  BMP shows elevated creatinine of 2.8.  Patient received IV iron in April 2024.  Interval history-patient reports doing well overall.  Energy levels are good and she denies any blood loss in her stool or urine  ECOG PS- 0 Pain scale- 0   Review of systems- Review of Systems  Constitutional:  Negative for chills, fever, malaise/fatigue and weight loss.  HENT:  Negative for congestion, ear discharge and nosebleeds.   Eyes:  Negative for blurred vision.  Respiratory:  Negative for cough, hemoptysis, sputum production, shortness of breath and wheezing.   Cardiovascular:  Negative for chest pain, palpitations, orthopnea and claudication.  Gastrointestinal:  Negative for abdominal pain, blood in stool, constipation, diarrhea, heartburn, melena, nausea and vomiting.  Genitourinary:  Negative for dysuria, flank pain, frequency, hematuria and urgency.  Musculoskeletal:  Negative for back pain, joint pain and myalgias.  Skin:  Negative for rash.  Neurological:  Negative for dizziness, tingling, focal weakness, seizures, weakness and headaches.  Endo/Heme/Allergies:  Does not bruise/bleed easily.  Psychiatric/Behavioral:  Negative for depression and suicidal ideas. The  patient does not have insomnia.       Allergies  Allergen Reactions   Sulfate Rash   Codeine Sulfate Nausea Only   Benicar [Olmesartan]     Talked with patient February 10, 2020, intolerance is unclear, tried several medications  around that time and one of them gave her a rash but she is not clear which 1.   Amoxicillin Rash   Clindamycin/Lincomycin Rash   Entresto [Sacubitril-Valsartan] Other (See Comments)    hyperkalemia   Morphine And Codeine Rash   Penicillins Rash     Past Medical History:  Diagnosis Date   B12 deficiency    Cardiomyopathy (HCC)    a. 09/2022 Echo: EF 35-40%; b. 09/2022 MV: apical defect w/ mild peri-infarct ischemia. EF 43%.   Chronic HFrEF (heart failure with reduced ejection fraction) (HCC)    a. 01/2020 Echo: EF 30-35%; b. 07/2020 Echo: EF 50-55%; c. 06/2022 Echo: EF 50-55%; d. 09/2022 Echo: EF 35-40%, globh HK, mod reduced RV fxn, sev BAE, mod MR.   CKD (chronic kidney disease), stage IV (HCC)    COPD (chronic obstructive pulmonary disease) (HCC)    Depression    Endometriosis    Hypertension    Mixed hyperlipidemia    Moderate mitral regurgitation    Normocytic anemia    PAF (paroxysmal atrial fibrillation) (HCC)    a. CHA2DS2VASc = 4-->eliquis 2.5 bid.   Paroxymsal Atrial flutter (HCC)    Tobacco abuse      Past Surgical History:  Procedure Laterality Date   ABDOMINAL HYSTERECTOMY     ATRIAL FIBRILLATION ABLATION N/A 08/04/2023   Procedure: ATRIAL FIBRILLATION ABLATION;  Surgeon: Lanier Prude, MD;  Location: [facility] INVASIVE CV LAB;  Service: Cardiovascular;  Laterality: N/A;   BREAST BIOPSY Right 2015   neg-  FIBROADENOMA   CENTRAL LINE INSERTION  11/15/2022   Procedure: CENTRAL LINE INSERTION;  Surgeon: Yvonne Kendall, MD;  Location: [facility] INVASIVE CV LAB;  Service: Cardiovascular;;   COLONOSCOPY WITH PROPOFOL N/A 11/24/2022   Procedure: COLONOSCOPY WITH PROPOFOL;  Surgeon: Regis Bill, MD;  Location: [facility] ENDOSCOPY;  Service: Endoscopy;  Laterality: N/A;   COLONOSCOPY WITH PROPOFOL N/A 11/23/2022   Procedure: COLONOSCOPY WITH PROPOFOL;  Surgeon: Regis Bill, MD;  Location: [facility] ENDOSCOPY;  Service: Endoscopy;  Laterality: N/A;    ESOPHAGOGASTRODUODENOSCOPY (EGD) WITH PROPOFOL N/A 11/23/2022   Procedure: ESOPHAGOGASTRODUODENOSCOPY (EGD) WITH PROPOFOL;  Surgeon: Regis Bill, MD;  Location: [facility] ENDOSCOPY;  Service: Endoscopy;  Laterality: N/A;   RIGHT HEART CATH N/A 11/15/2022   Procedure: RIGHT HEART CATH;  Surgeon: Yvonne Kendall, MD;  Location: [facility] INVASIVE CV LAB;  Service: Cardiovascular;  Laterality: N/A;   RIGHT/LEFT HEART CATH AND CORONARY ANGIOGRAPHY N/A 11/25/2022   Procedure: RIGHT/LEFT HEART CATH AND CORONARY ANGIOGRAPHY;  Surgeon: Iran Ouch, MD;  Location: [facility] INVASIVE CV LAB;  Service: Cardiovascular;  Laterality: N/A;   TAH/RSO  1999   secondary to bleeding and endometriosis (Dr Haskel Khan)   TRANSESOPHAGEAL ECHOCARDIOGRAM (CATH LAB) N/A 08/04/2023   Procedure: TRANSESOPHAGEAL ECHOCARDIOGRAM;  Surgeon: Lanier Prude, MD;  Location: [facility] INVASIVE CV LAB;  Service: Cardiovascular;  Laterality: N/A;    Social History   Socioeconomic History   Marital status: Married    Spouse name: Not on file   Number of children: Not on file   Years of education: Not on file   Highest education level: Not on file  Occupational History   Not on file  Tobacco Use   Smoking status: Former  Current packs/day: 0.25    Average packs/day: 0.3 packs/day for 40.0 years (10.0 ttl pk-yrs)    Types: Cigarettes   Smokeless tobacco: Never   Tobacco comments:    Patient quit smoking recently   Vaping Use   Vaping status: Never Used  Substance and Sexual Activity   Alcohol use: Not Currently    Alcohol/week: 0.0 standard drinks of alcohol    Comment: occasional   Drug use: No   Sexual activity: Not Currently  Other Topics Concern   Not on file  Social History Narrative   Lives in [place] with her husband.  She is retired from [employer]	[place].  She does not routinely exercise.   Social Drivers of Corporate investment banker Strain: Low Risk  (12/12/2022)   Overall Financial Resource Strain  (CARDIA)    Difficulty of Paying Living Expenses: Not hard at all  Food Insecurity: No Food Insecurity (12/12/2022)   Hunger Vital Sign    Worried About Running Out of Food in the Last Year: Never true    Ran Out of Food in the Last Year: Never true  Transportation Needs: No Transportation Needs (12/12/2022)   PRAPARE - Administrator, Civil Service (Medical): No    Lack of Transportation (Non-Medical): No  Physical Activity: Sufficiently Active (12/12/2022)   Exercise Vital Sign    Days of Exercise per Week: 7 days    Minutes of Exercise per Session: 30 min  Stress: No Stress Concern Present (12/12/2022)   [employer] of Occupational Health - Occupational Stress Questionnaire    Feeling of Stress : Not at all  Social Connections: Unknown (12/12/2022)   Social Connection and Isolation Panel [NHANES]    Frequency of Communication with Friends and Family: Not on file    Frequency of Social Gatherings with Friends and Family: Not on file    Attends Religious Services: Not on file    Active Member of Clubs or Organizations: Not on file    Attends Banker Meetings: Not on file    Marital Status: Married  Intimate Partner Violence: Not At Risk (12/12/2022)   Humiliation, Afraid, Rape, and Kick questionnaire    Fear of Current or Ex-Partner: No    Emotionally Abused: No    Physically Abused: No    Sexually Abused: No    Family History  Problem Relation Age of Onset   Hypercholesterolemia Mother    Rheum arthritis Father    Rheum arthritis Daughter    Fibromyalgia Daughter    Breast cancer Neg Hx    Colon cancer Neg Hx      Current Outpatient Medications:    acetaminophen (TYLENOL) 325 MG tablet, Take 650 mg by mouth every 6 (six) hours as needed (pain.)., Disp: , Rfl:    amiodarone (PACERONE) 200 MG tablet, TAKE ONE TABLET BY MOUTH EVERY DAY, Disp: 30 tablet, Rfl: 0   atorvastatin (LIPITOR) 40 MG tablet, Take 1 tablet (40 mg total) by mouth daily.,  Disp: 90 tablet, Rfl: 1   buPROPion (WELLBUTRIN XL) 150 MG 24 hr tablet, Take 1 tablet (150 mg total) by mouth daily., Disp: 90 tablet, Rfl: 1   calcitRIOL (ROCALTROL) 0.25 MCG capsule, Take 0.25 mcg by mouth daily., Disp: , Rfl:    calcium carbonate (OS-CAL - DOSED IN MG OF ELEMENTAL CALCIUM) 1250 (500 Ca) MG tablet, Take 1 tablet (500 mg of elemental calcium total) by mouth 3 (three) times daily with meals., Disp: 60 tablet, Rfl:  1   carvedilol (COREG) 12.5 MG tablet, Take 1.5 tablets (18.75 mg total) by mouth 2 (two) times daily. (Patient taking differently: Take 12.5 mg by mouth 2 (two) times daily.), Disp: 90 tablet, Rfl: 3   FARXIGA 10 MG TABS tablet, Take 10 mg by mouth daily., Disp: , Rfl:    hydrALAZINE (APRESOLINE) 25 MG tablet, Take 1 tablet (25 mg total) by mouth 3 (three) times daily., Disp: 90 tablet, Rfl: 5   isosorbide mononitrate (IMDUR) 30 MG 24 hr tablet, Take 1 tablet (30 mg total) by mouth daily., Disp: 90 tablet, Rfl: 3   levothyroxine (SYNTHROID) 137 MCG tablet, TAKE ONE TABLET BY MOUTH EVERY DAY, Disp: 90 tablet, Rfl: 1   loratadine (CLARITIN) 10 MG tablet, Take 10 mg by mouth daily., Disp: , Rfl:    omeprazole (PRILOSEC) 20 MG capsule, Take 1 capsule (20 mg total) by mouth daily., Disp: , Rfl:    torsemide (DEMADEX) 20 MG tablet, TAKE 60 MG ALTERNATING WITH 40 MG EVERY OTHER DAY, Disp: 270 tablet, Rfl: 3   venlafaxine XR (EFFEXOR-XR) 150 MG 24 hr capsule, TAKE ONE CAPSULE BY MOUTH EVERY DAY, Disp: 90 capsule, Rfl: 1   warfarin (COUMADIN) 2.5 MG tablet, Take 1 tablet (2.5 mg total) by mouth daily., Disp: 30 tablet, Rfl: 1   doxycycline (VIBRA-TABS) 100 MG tablet, Take 1 tablet (100 mg total) by mouth 2 (two) times daily. (Patient not taking: Reported on 10/28/2023), Disp: 14 tablet, Rfl: 0   Tiotropium Bromide-Olodaterol 2.5-2.5 MCG/ACT AERS, Inhale 2 puffs into the lungs daily. (Patient not taking: Reported on 07/30/2023), Disp: 4 g, Rfl: 3  Physical exam:  Vitals:   10/28/23  1000  BP: 128/68  Pulse: 81  Resp: 16  Temp: (!) 97.1 F (36.2 C)  Weight: 120 lb 14.4 oz (54.8 kg)   Physical Exam Cardiovascular:     Rate and Rhythm: Normal rate and regular rhythm.     Heart sounds: Normal heart sounds.  Pulmonary:     Effort: Pulmonary effort is normal.     Breath sounds: Normal breath sounds.  Skin:    General: Skin is warm and dry.  Neurological:     Mental Status: She is alert and oriented to person, place, and time.         Latest Ref Rng & Units 10/27/2023   11:13 AM  CMP  Glucose 70 - 99 mg/dL 161   BUN 8 - 23 mg/dL 46   Creatinine 0.96 - 1.00 mg/dL 0.45   Sodium 409 - 811 mmol/L 140   Potassium 3.5 - 5.1 mmol/L 4.0   Chloride 98 - 111 mmol/L 109   CO2 22 - 32 mmol/L 22   Calcium 8.9 - 10.3 mg/dL 9.3   Total Protein 6.5 - 8.1 g/dL 7.3   Total Bilirubin 0.0 - 1.2 mg/dL 0.7   Alkaline Phos 38 - 126 U/L 68   AST 15 - 41 U/L 21   ALT 0 - 44 U/L 19       Latest Ref Rng & Units 10/28/2023    9:50 AM  CBC  WBC 4.0 - 10.5 K/uL 7.7   Hemoglobin 12.0 - 15.0 g/dL 91.4   Hematocrit 78.2 - 46.0 % 41.5   Platelets 150 - 400 K/uL 234     Assessment and plan- Patient is a 68 y.o. female here for routine follow-up of iron deficiency anemia  Patient is not anemic with an H&H of 13.5/41.5.  However iron studies do indicate  mild iron deficiency given that her ferritin levels at 25 although iron studies are within normal limits.  Patient would like to receive further IV iron at this time.  Discussed risks and benefits of IV iron including all but not limited to possible risk of infusion reaction.  She has tolerated Feraheme well in the past we will plan to give her 2 doses of Feraheme.  CBC ferritin and iron studies in 4 and 8 months and I will see her back in 8 months   Visit Diagnosis 1. Iron deficiency anemia, unspecified iron deficiency anemia type      Dr. Owens Shark, MD, MPH [facility] [phone] 10/28/2023 1:08  PM

## 2023-10-28 NOTE — Telephone Encounter (Signed)
-----   Message from Creig Hines sent at 10/28/2023 10:47 AM EST ----- Ferritin is low at 25. Does she want iv iron?

## 2023-10-28 NOTE — Progress Notes (Signed)
 Patient denies new or acute problems/concerns today.

## 2023-10-29 ENCOUNTER — Ambulatory Visit: Payer: Medicare Other | Attending: Cardiovascular Disease

## 2023-10-29 ENCOUNTER — Other Ambulatory Visit: Payer: Self-pay

## 2023-10-29 ENCOUNTER — Inpatient Hospital Stay

## 2023-10-29 VITALS — BP 123/72 | HR 80 | Temp 98.0°F | Resp 19

## 2023-10-29 DIAGNOSIS — D509 Iron deficiency anemia, unspecified: Secondary | ICD-10-CM | POA: Diagnosis not present

## 2023-10-29 DIAGNOSIS — I480 Paroxysmal atrial fibrillation: Secondary | ICD-10-CM | POA: Insufficient documentation

## 2023-10-29 DIAGNOSIS — E039 Hypothyroidism, unspecified: Secondary | ICD-10-CM

## 2023-10-29 DIAGNOSIS — Z7901 Long term (current) use of anticoagulants: Secondary | ICD-10-CM | POA: Insufficient documentation

## 2023-10-29 DIAGNOSIS — D508 Other iron deficiency anemias: Secondary | ICD-10-CM

## 2023-10-29 LAB — POCT INR: INR: 1.7 — AB (ref 2.0–3.0)

## 2023-10-29 MED ORDER — SODIUM CHLORIDE 0.9 % IV SOLN
INTRAVENOUS | Status: DC
Start: 2023-10-29 — End: 2023-10-29
  Filled 2023-10-29: qty 250

## 2023-10-29 MED ORDER — LEVOTHYROXINE SODIUM 75 MCG PO TABS
75.0000 ug | ORAL_TABLET | Freq: Every day | ORAL | 0 refills | Status: DC
Start: 2023-10-29 — End: 2023-12-01

## 2023-10-29 MED ORDER — SODIUM CHLORIDE 0.9 % IV SOLN
510.0000 mg | INTRAVENOUS | Status: DC
  Administered 2023-10-29: 510 mg via INTRAVENOUS
  Filled 2023-10-29: qty 510

## 2023-10-29 NOTE — Patient Instructions (Signed)

## 2023-10-29 NOTE — Patient Instructions (Signed)
 Take 2.5 tablets today only then continue taking 1 tablet Daily, except 1.5 tablets on Monday, Wednesday and Friday.  INR in 2 weeks.   [phone]

## 2023-10-30 ENCOUNTER — Other Ambulatory Visit: Payer: Self-pay | Admitting: Internal Medicine

## 2023-11-03 ENCOUNTER — Ambulatory Visit: Payer: Medicare Other | Admitting: Cardiology

## 2023-11-05 ENCOUNTER — Ambulatory Visit (INDEPENDENT_AMBULATORY_CARE_PROVIDER_SITE_OTHER): Payer: Medicare Other | Admitting: Student in an Organized Health Care Education/Training Program

## 2023-11-05 ENCOUNTER — Encounter: Payer: Self-pay | Admitting: Student in an Organized Health Care Education/Training Program

## 2023-11-05 VITALS — BP 150/70 | HR 85 | Temp 98.2°F | Ht 65.0 in | Wt 123.2 lb

## 2023-11-05 DIAGNOSIS — I5030 Unspecified diastolic (congestive) heart failure: Secondary | ICD-10-CM | POA: Diagnosis not present

## 2023-11-05 DIAGNOSIS — R0602 Shortness of breath: Secondary | ICD-10-CM

## 2023-11-05 DIAGNOSIS — Z87891 Personal history of nicotine dependence: Secondary | ICD-10-CM | POA: Diagnosis not present

## 2023-11-06 ENCOUNTER — Inpatient Hospital Stay

## 2023-11-06 ENCOUNTER — Other Ambulatory Visit: Payer: Self-pay | Admitting: Internal Medicine

## 2023-11-06 VITALS — BP 152/75 | HR 76 | Temp 97.1°F | Resp 19

## 2023-11-06 DIAGNOSIS — D509 Iron deficiency anemia, unspecified: Secondary | ICD-10-CM | POA: Diagnosis not present

## 2023-11-06 DIAGNOSIS — D508 Other iron deficiency anemias: Secondary | ICD-10-CM

## 2023-11-06 MED ORDER — SODIUM CHLORIDE 0.9 % IV SOLN
510.0000 mg | INTRAVENOUS | Status: DC
  Administered 2023-11-06: 510 mg via INTRAVENOUS
  Filled 2023-11-06: qty 510

## 2023-11-07 ENCOUNTER — Telehealth: Payer: Self-pay | Admitting: Internal Medicine

## 2023-11-07 NOTE — Telephone Encounter (Signed)
 Per chart review, buPROPion (WELLBUTRIN XL) 150 MG 24 hr tablet was sent today by PCP to [facility] on file.   Receipt confirmed by pharmacy (11/07/2023  9:52 AM EDT)  90 tablets/1 refill  Copied from CRM #[id number]. Topic: Clinical - Medication Refill >> Nov 07, 2023 12:43 PM [place] A wrote: Most Recent Primary Care Visit:  Provider: Dale [place]  Department: [facility]  Visit Type: OFFICE VISIT  Date: 10/22/2023  Medication: buPROPion (WELLBUTRIN XL) 150 MG 24 hr tablet  Has the patient contacted their pharmacy? No (Agent: If no, request that the patient contact the pharmacy for the refill. If patient does not wish to contact the pharmacy document the reason why and proceed with request.) (Agent: If yes, when and what did the pharmacy advise?)  Is this the correct pharmacy for this prescription? Yes If no, delete pharmacy and type the correct one.  This is the patient's preferred pharmacy:  [facility] [phone] [facility] [place] [address] [place] [address] Phone: [phone] Fax: [phone]  [facility] [address] [place] [address] Phone: [phone] Fax: [phone]   Has the prescription been filled recently? No  Is the patient out of the medication? Yes  Has the patient been seen for an appointment in the last year OR does the patient have an upcoming appointment? Yes  Can we respond through MyChart? No  Agent: Please be advised that Rx refills may take up to 3 business days. We ask that you follow-up with your pharmacy.

## 2023-11-07 NOTE — Progress Notes (Signed)
 Assessment & Plan:   1. Shortness of breath (Primary)  Patient presets for evaluation of shortness of breath and nocturnal use of oxygen. She has a history of heart failure with multiple admissions and is followed closely by cardiology. She recently underwent afib ablation and reports symptoms are well controlled. She also has a history of CKD for which she is being considered for transplant. Patient carries a diagnosis of presumed COPD, though no PFT's were performed.  On exam today, her lungs are clear and without wheeze. I performed POCUS of the right lung to evaluate for any pleural effusions and did not note any. She is not currently on any inhaler therapy. She reports minimal symptoms.  Transplant evaluation notes reviewed, with need for pulmonary evaluation prior to consideration for transplant. Given history of smoking, it is reasonable to obtain PFT's to assess spirometry for COPD. I will also obtain overnight pulse oximetry (on room air) to assess true need for nocturnal oxygen therapy. Furthermore, we will refer the patient to our lung cancer screening program for low dose CT.  - Pulmonary Function Test [facility] Only; Future - Ambulatory Referral for Lung Cancer Screening - Overnight Pulse Oximetry Study; Future   Return in about 2 months (around 01/05/2024).  I spent 65 minutes caring for this patient today, including preparing to see the patient, obtaining a medical history , reviewing a separately obtained history, performing a medically appropriate examination and/or evaluation, counseling and educating the patient/family/caregiver, ordering medications, tests, or procedures, documenting clinical information in the electronic health record, independently interpreting results (not separately reported/billed) and communicating results to the patient/family/caregiver, and care coordination (not separately reported/billed)  Raechel Chute, MD [facility] 11/07/2023  11:16 AM    End of visit medications:  No orders of the defined types were placed in this encounter.    Current Outpatient Medications:    acetaminophen (TYLENOL) 325 MG tablet, Take 650 mg by mouth every 6 (six) hours as needed (pain.)., Disp: , Rfl:    amiodarone (PACERONE) 200 MG tablet, TAKE ONE TABLET BY MOUTH EVERY DAY, Disp: 30 tablet, Rfl: 0   atorvastatin (LIPITOR) 40 MG tablet, Take 1 tablet (40 mg total) by mouth daily., Disp: 90 tablet, Rfl: 1   calcitRIOL (ROCALTROL) 0.25 MCG capsule, Take 0.25 mcg by mouth daily., Disp: , Rfl:    calcium carbonate (OS-CAL - DOSED IN MG OF ELEMENTAL CALCIUM) 1250 (500 Ca) MG tablet, Take 1 tablet (500 mg of elemental calcium total) by mouth 3 (three) times daily with meals., Disp: 60 tablet, Rfl: 1   carvedilol (COREG) 12.5 MG tablet, Take 1.5 tablets (18.75 mg total) by mouth 2 (two) times daily. (Patient taking differently: Take 12.5 mg by mouth 2 (two) times daily.), Disp: 90 tablet, Rfl: 3   FARXIGA 10 MG TABS tablet, Take 10 mg by mouth daily., Disp: , Rfl:    hydrALAZINE (APRESOLINE) 25 MG tablet, Take 1 tablet (25 mg total) by mouth 3 (three) times daily., Disp: 90 tablet, Rfl: 5   isosorbide mononitrate (IMDUR) 30 MG 24 hr tablet, Take 1 tablet (30 mg total) by mouth daily., Disp: 90 tablet, Rfl: 3   levothyroxine (SYNTHROID) 75 MCG tablet, Take 1 tablet (75 mcg total) by mouth daily., Disp: 30 tablet, Rfl: 0   loratadine (CLARITIN) 10 MG tablet, Take 10 mg by mouth daily., Disp: , Rfl:    omeprazole (PRILOSEC) 20 MG capsule, Take 1 capsule (20 mg total) by mouth daily., Disp: , Rfl:  Tiotropium Bromide-Olodaterol 2.5-2.5 MCG/ACT AERS, Inhale 2 puffs into the lungs daily., Disp: 4 g, Rfl: 3   torsemide (DEMADEX) 20 MG tablet, TAKE 60 MG ALTERNATING WITH 40 MG EVERY OTHER DAY, Disp: 270 tablet, Rfl: 3   venlafaxine XR (EFFEXOR-XR) 150 MG 24 hr capsule, TAKE ONE CAPSULE BY MOUTH EVERY DAY, Disp: 90 capsule, Rfl: 1   warfarin (COUMADIN)  2.5 MG tablet, Take 1 tablet (2.5 mg total) by mouth daily., Disp: 30 tablet, Rfl: 1   buPROPion (WELLBUTRIN XL) 150 MG 24 hr tablet, TAKE ONE TABLET BY MOUTH EVERY DAY, Disp: 90 tablet, Rfl: 1   Subjective:   PATIENT ID: Dawn Sherman GENDER: female DOB: 05/17/1956, MRN: [id number]  Chief Complaint  Patient presents with   Consult    Needs eval of lungs for kidney transplant list.    HPI  Patient is a pleasant 68 year old female with a history of CKD presenting to clinic to establish care.  Patient is followed for CKD by Dr. Ellouise Newer, and was referred to [facility] for a transplant evaluation. She has a history of Afib and HFrecEF followed closely by Dr. Shirlee Latch. [facility] surgery and Elwyn Lade, MD from nephrology). Given history of smoking and concern for COPD, as well as overnight oxygen use, she is referred to pulmonary for an evaluation, as oxygen use would preclude her transplant candidacy.  Today, she reports being in her usual state of health. She has no shortness of breath at rest nor with exertion. She tells me she was able to walk from the parking lot to our office without any limitation. She does not have any wheeze, cough, chest tightness, or sputum production. She does not use any inhalers. She does have oxygen therapy that she was prescribed after a hospitalization that she is using overnight.  Patient reports a history of smoking, quit recently. She reports smoking up to half a pack a day for over 40 years. Likely with 20 to 30 pack years of smoking history .Denies any occupational exposures.  Medical record reviewed in length.  -She had a history of alcohol abuse, managed with PCP with report of abstinence as far back as 2020.  -She was admitted to the hospital in June of 2021 with shortness of breath and acute decompensated HFrEF (EF 30%) and afib, managed with GDMT and Apixaban.  -Repeat  echo a few months later showed EF recovery.  -She was re-admitted 07/2021 with shortness of breath to the hospital with hypoxic respiratory failure felt to be secondary to a COPD exacerbation. She was discharged on oxygen (2L via [place]) with plan to follow up with PCP and pulmonary.  -She had a fall 11/2021 and admitted with finding of subarachnoid hemorrhage, noted to be stable on repeat imaging and managed conservatively. Fall felt to be secondary to second degree heart block in the setting of beta blocker use (discontinued).  -She was re-admitted July of 2023 due to decompensated heart failure and treated with IV diuretics.  -Another admission noted November of 2023 for hypoxic respiratory failure secondary to decompensated heart failure and Afib with RVR. She was discharged on room air during said admission. -Again admitted with hypoxia and shortness of breath January of 2024, treated for decompensated heart failure, and discharged home on 3L via [place] -admit 09/2022 with hypoxia, shortness of breath, found to have a pleural effusion (right sided) s/p thoracentesis. -March/2024: admit with  decompensated heart failure, required RHC x2 and aggressive diuresis with ionotropic support (milrinone) -RHC 10/2022 RA 21, RV 62/24, PA 63/38 (46), PAOP 34, CO/CI 3.4/2, PVR 3.5 wood -RHC 11/2022 RA 6, RV 50/3, PA 50/17 (30), PAOP 16, LVEDP 15, CO/CI 6.08/3.7. LHC normal coronaries. -07/2023: afib ablatio (pulmonary vein isolation, posterior wall ablation, cavotricuspid isthums ablation for flutter)  Ancillary information including prior medications, full medical/surgical/family/social histories, and PFTs (when available) are listed below and have been reviewed.   Review of Systems  Constitutional:  Negative for chills, fever, malaise/fatigue and weight loss.  Respiratory:  Negative for cough, hemoptysis, sputum production, shortness of breath and wheezing.   Cardiovascular:  Negative for chest pain.      Objective:   Vitals:   11/05/23 1512  BP: (!) 150/70  Pulse: 85  Temp: 98.2 F (36.8 C)  TempSrc: Oral  SpO2: 92%  Weight: 123 lb 3.2 oz (55.9 kg)  Height: 5\' 5"  (1.651 m)   92% on RA BMI Readings from Last 3 Encounters:  11/05/23 20.50 kg/m  10/28/23 20.12 kg/m  10/27/23 20.05 kg/m   Wt Readings from Last 3 Encounters:  11/05/23 123 lb 3.2 oz (55.9 kg)  10/28/23 120 lb 14.4 oz (54.8 kg)  10/27/23 120 lb 8 oz (54.7 kg)    Physical Exam Constitutional:      Appearance: Normal appearance.  Cardiovascular:     Rate and Rhythm: Normal rate and regular rhythm.     Pulses: Normal pulses.     Heart sounds: Normal heart sounds.  Pulmonary:     Effort: Pulmonary effort is normal.     Breath sounds: Normal breath sounds. No wheezing.  Neurological:     General: No focal deficit present.     Mental Status: She is alert and oriented to person, place, and time. Mental status is at baseline.       Ancillary Information    Past Medical History:  Diagnosis Date   B12 deficiency    Cardiomyopathy (HCC)    a. 09/2022 Echo: EF 35-40%; b. 09/2022 MV: apical defect w/ mild peri-infarct ischemia. EF 43%.   Chronic HFrEF (heart failure with reduced ejection fraction) (HCC)    a. 01/2020 Echo: EF 30-35%; b. 07/2020 Echo: EF 50-55%; c. 06/2022 Echo: EF 50-55%; d. 09/2022 Echo: EF 35-40%, globh HK, mod reduced RV fxn, sev BAE, mod MR.   CKD (chronic kidney disease), stage IV (HCC)    COPD (chronic obstructive pulmonary disease) (HCC)    Depression    Endometriosis    Hypertension    Mixed hyperlipidemia    Moderate mitral regurgitation    Normocytic anemia    PAF (paroxysmal atrial fibrillation) (HCC)    a. CHA2DS2VASc = 4-->eliquis 2.5 bid.   Paroxymsal Atrial flutter (HCC)    Tobacco abuse      Family History  Problem Relation Age of Onset   Hypercholesterolemia Mother    Rheum arthritis Father    Rheum arthritis Daughter    Fibromyalgia Daughter    Breast  cancer Neg Hx    Colon cancer Neg Hx      Past Surgical History:  Procedure Laterality Date   ABDOMINAL HYSTERECTOMY     ATRIAL FIBRILLATION ABLATION N/A 08/04/2023   Procedure: ATRIAL FIBRILLATION ABLATION;  Surgeon: Lanier Prude, MD;  Location: [facility] INVASIVE CV LAB;  Service: Cardiovascular;  Laterality: N/A;   BREAST BIOPSY Right 2015   neg-  FIBROADENOMA   CENTRAL LINE INSERTION  11/15/2022   Procedure:  CENTRAL LINE INSERTION;  Surgeon: Yvonne Kendall, MD;  Location: [facility] INVASIVE CV LAB;  Service: Cardiovascular;;   COLONOSCOPY WITH PROPOFOL N/A 11/24/2022   Procedure: COLONOSCOPY WITH PROPOFOL;  Surgeon: Regis Bill, MD;  Location: [facility] ENDOSCOPY;  Service: Endoscopy;  Laterality: N/A;   COLONOSCOPY WITH PROPOFOL N/A 11/23/2022   Procedure: COLONOSCOPY WITH PROPOFOL;  Surgeon: Regis Bill, MD;  Location: [facility] ENDOSCOPY;  Service: Endoscopy;  Laterality: N/A;   ESOPHAGOGASTRODUODENOSCOPY (EGD) WITH PROPOFOL N/A 11/23/2022   Procedure: ESOPHAGOGASTRODUODENOSCOPY (EGD) WITH PROPOFOL;  Surgeon: Regis Bill, MD;  Location: [facility] ENDOSCOPY;  Service: Endoscopy;  Laterality: N/A;   RIGHT HEART CATH N/A 11/15/2022   Procedure: RIGHT HEART CATH;  Surgeon: Yvonne Kendall, MD;  Location: [facility] INVASIVE CV LAB;  Service: Cardiovascular;  Laterality: N/A;   RIGHT/LEFT HEART CATH AND CORONARY ANGIOGRAPHY N/A 11/25/2022   Procedure: RIGHT/LEFT HEART CATH AND CORONARY ANGIOGRAPHY;  Surgeon: Iran Ouch, MD;  Location: [facility] INVASIVE CV LAB;  Service: Cardiovascular;  Laterality: N/A;   TAH/RSO  1999   secondary to bleeding and endometriosis (Dr Haskel Khan)   TRANSESOPHAGEAL ECHOCARDIOGRAM (CATH LAB) N/A 08/04/2023   Procedure: TRANSESOPHAGEAL ECHOCARDIOGRAM;  Surgeon: Lanier Prude, MD;  Location: [facility] INVASIVE CV LAB;  Service: Cardiovascular;  Laterality: N/A;    Social History   Socioeconomic History   Marital status: Married    Spouse name: Not on file   Number  of children: Not on file   Years of education: Not on file   Highest education level: Not on file  Occupational History   Not on file  Tobacco Use   Smoking status: Former    Current packs/day: 0.25    Average packs/day: 0.3 packs/day for 40.0 years (10.0 ttl pk-yrs)    Types: Cigarettes   Smokeless tobacco: Never   Tobacco comments:    Patient quit smoking recently   Vaping Use   Vaping status: Never Used  Substance and Sexual Activity   Alcohol use: Not Currently    Alcohol/week: 0.0 standard drinks of alcohol    Comment: occasional   Drug use: No   Sexual activity: Not Currently  Other Topics Concern   Not on file  Social History Narrative   Lives in [place] with her husband.  She is retired from [employer]	[place].  She does not routinely exercise.   Social Drivers of Corporate investment banker Strain: Low Risk  (12/12/2022)   Overall Financial Resource Strain (CARDIA)    Difficulty of Paying Living Expenses: Not hard at all  Food Insecurity: No Food Insecurity (12/12/2022)   Hunger Vital Sign    Worried About Running Out of Food in the Last Year: Never true    Ran Out of Food in the Last Year: Never true  Transportation Needs: No Transportation Needs (12/12/2022)   PRAPARE - Administrator, Civil Service (Medical): No    Lack of Transportation (Non-Medical): No  Physical Activity: Sufficiently Active (12/12/2022)   Exercise Vital Sign    Days of Exercise per Week: 7 days    Minutes of Exercise per Session: 30 min  Stress: No Stress Concern Present (12/12/2022)   [employer] of Occupational Health - Occupational Stress Questionnaire    Feeling of Stress : Not at all  Social Connections: Unknown (12/12/2022)   Social Connection and Isolation Panel [NHANES]    Frequency of Communication with Friends and Family: Not on file    Frequency of Social Gatherings with Friends and  Family: Not on file    Attends Religious Services: Not on file    Active  Member of Clubs or Organizations: Not on file    Attends Club or Organization Meetings: Not on file    Marital Status: Married  Intimate Partner Violence: Not At Risk (12/12/2022)   Humiliation, Afraid, Rape, and Kick questionnaire    Fear of Current or Ex-Partner: No    Emotionally Abused: No    Physically Abused: No    Sexually Abused: No     Allergies  Allergen Reactions   Sulfate Rash   Codeine Sulfate Nausea Only   Benicar [Olmesartan]     Talked with patient February 10, 2020, intolerance is unclear, tried several medications around that time and one of them gave her a rash but she is not clear which 1.   Amoxicillin Rash   Clindamycin/Lincomycin Rash   Entresto [Sacubitril-Valsartan] Other (See Comments)    hyperkalemia   Morphine And Codeine Rash   Penicillins Rash     CBC    Component Value Date/Time   WBC 7.7 10/28/2023 0950   RBC 4.44 10/28/2023 0950   HGB 13.5 10/28/2023 0950   HGB 13.6 04/15/2023 1210   HCT 41.5 10/28/2023 0950   HCT 41.4 04/15/2023 1210   PLT 234 10/28/2023 0950   PLT 129 (L) 04/15/2023 1210   MCV 93.5 10/28/2023 0950   MCV 95 04/15/2023 1210   MCH 30.4 10/28/2023 0950   MCHC 32.5 10/28/2023 0950   RDW 13.7 10/28/2023 0950   RDW 12.7 04/15/2023 1210   LYMPHSABS 0.6 (L) 01/02/2023 0731   MONOABS 0.6 01/02/2023 0731   EOSABS 0.1 01/02/2023 0731   BASOSABS 0.1 01/02/2023 0731    Pulmonary Functions Testing Results:     No data to display          Outpatient Medications Prior to Visit  Medication Sig Dispense Refill   acetaminophen (TYLENOL) 325 MG tablet Take 650 mg by mouth every 6 (six) hours as needed (pain.).     amiodarone (PACERONE) 200 MG tablet TAKE ONE TABLET BY MOUTH EVERY DAY 30 tablet 0   atorvastatin (LIPITOR) 40 MG tablet Take 1 tablet (40 mg total) by mouth daily. 90 tablet 1   calcitRIOL (ROCALTROL) 0.25 MCG capsule Take 0.25 mcg by mouth daily.     calcium carbonate (OS-CAL - DOSED IN MG OF ELEMENTAL CALCIUM) 1250  (500 Ca) MG tablet Take 1 tablet (500 mg of elemental calcium total) by mouth 3 (three) times daily with meals. 60 tablet 1   carvedilol (COREG) 12.5 MG tablet Take 1.5 tablets (18.75 mg total) by mouth 2 (two) times daily. (Patient taking differently: Take 12.5 mg by mouth 2 (two) times daily.) 90 tablet 3   FARXIGA 10 MG TABS tablet Take 10 mg by mouth daily.     hydrALAZINE (APRESOLINE) 25 MG tablet Take 1 tablet (25 mg total) by mouth 3 (three) times daily. 90 tablet 5   isosorbide mononitrate (IMDUR) 30 MG 24 hr tablet Take 1 tablet (30 mg total) by mouth daily. 90 tablet 3   levothyroxine (SYNTHROID) 75 MCG tablet Take 1 tablet (75 mcg total) by mouth daily. 30 tablet 0   loratadine (CLARITIN) 10 MG tablet Take 10 mg by mouth daily.     omeprazole (PRILOSEC) 20 MG capsule Take 1 capsule (20 mg total) by mouth daily.     Tiotropium Bromide-Olodaterol 2.5-2.5 MCG/ACT AERS Inhale 2 puffs into the lungs daily. 4 g 3  torsemide (DEMADEX) 20 MG tablet TAKE 60 MG ALTERNATING WITH 40 MG EVERY OTHER DAY 270 tablet 3   venlafaxine XR (EFFEXOR-XR) 150 MG 24 hr capsule TAKE ONE CAPSULE BY MOUTH EVERY DAY 90 capsule 1   warfarin (COUMADIN) 2.5 MG tablet Take 1 tablet (2.5 mg total) by mouth daily. 30 tablet 1   buPROPion (WELLBUTRIN XL) 150 MG 24 hr tablet Take 1 tablet (150 mg total) by mouth daily. 90 tablet 1   doxycycline (VIBRA-TABS) 100 MG tablet Take 1 tablet (100 mg total) by mouth 2 (two) times daily. (Patient not taking: Reported on 11/05/2023) 14 tablet 0   No facility-administered medications prior to visit.

## 2023-11-07 NOTE — Telephone Encounter (Signed)
 NOTED

## 2023-11-08 NOTE — Progress Notes (Unsigned)
 Electrophysiology Clinic Note    Date:  11/10/2023  Patient ID:  Dawn, Sherman 1955-12-22, MRN [id number] PCP:  Dale Sloan, MD  Cardiologist:  Julien Nordmann, MD HF Cardiologist: Marca Ancona, MD Electrophysiologist: Lanier Prude, MD   Discussed the use of AI scribe software for clinical note transcription with the patient, who gave verbal consent to proceed.   Patient Profile    Chief Complaint: AFib ablation follow-up  History of Present Illness: Dawn Sherman is a 69 y.o. female with PMH notable for parox AFib, HFimpEF, COPD, MR, HTN, HLD, CKD-stage 4, hypothyroid; seen today for Lanier Prude, MD for routine electrophysiology followup.  Her previous HF was thought to be d/t tachy-mediated from AFib.  She is s/p AFib ablation w PVI, posterior wall, and CTI on 08/04/2023 by Dr. Lalla Brothers.  She is scheduled for LAAO w watchman 4/24.   I saw her for 1 mon post-ablation appt, she had stopped eliquis d/t unaffordable and was transitioned to xarelto. Xarelto was also unaffordable and so has started on warfarin, managed at [facility] office. INRs have been labile, last INR 1.7 on 3/5  On follow-up today, she has not had any AF episodes that she is aware of, but historically has been asymptomatic of episodes. She denies chest pain, chest pressure, palpitations. Good appetite, no bleeding concerns on warfarin.  She checks her BP daily, readings 130s/70s, believes BP is elevated today d/t stressful drive to appointment with weather. She took AM meds this morning. She denies headache or vision changes.    AAD History: Amiodarone     ROS:  Please see the history of present illness. All other systems are reviewed and otherwise negative.    Physical Exam    VS:  BP (!) 188/100 (BP Location: Left Arm, Patient Position: Sitting, Cuff Size: Normal)   Pulse 89   Ht 5\' 5"  (1.651 m)   Wt 121 lb 6.4 oz (55.1 kg)   SpO2 92%   BMI 20.20 kg/m  BMI: Body mass index is 20.2  kg/m.  Wt Readings from Last 3 Encounters:  11/10/23 121 lb 6.4 oz (55.1 kg)  11/05/23 123 lb 3.2 oz (55.9 kg)  10/28/23 120 lb 14.4 oz (54.8 kg)     GEN- The patient is well appearing, alert and oriented x 3 today.   Lungs- Clear to ausculation bilaterally, distant lung sounds, normal work of breathing.  Heart- Regular rate and rhythm, no murmurs, rubs or gallops Extremities- No peripheral edema, warm, dry    Studies Reviewed   Previous EP, cardiology notes.    EKG is ordered. Personal review of EKG from today shows:          TEE, 08/04/2023  1. Left ventricular ejection fraction, by estimation, is 50 to 55%. The left ventricle has low normal function.   2. Right ventricular systolic function is normal. The right ventricular size is normal.   3. Left atrial size was mildly dilated. No left atrial/left atrial appendage thrombus was detected.   4. The mitral valve is grossly normal. Mild mitral valve regurgitation. No evidence of mitral stenosis.   5. The aortic valve is normal in structure. Aortic valve regurgitation is trivial. No aortic stenosis is present.   6. There is mild (Grade II) plaque involving the descending aorta.   7. Continuous sedation prior to abalation procedure.   Cardiac MRI, 04/02/2023 1.  Normal LV size, mildly reduced LV systolic function.  LVEF 47%.  2.  There is no LGE or scar.  3.  No evidence for myocardial infiltration.  4.  Mildly elevated ECV value.  5.  Normal RV size and function.  6.  Findings suggest non ischemic cardiomyopathy.  TTE, 11/14/2022 1. Left ventricular ejection fraction, by estimation, is 25 to 30%. The left ventricle has severely decreased function. The left ventricle demonstrates global hypokinesis. There is moderate left ventricular hypertrophy. Left ventricular diastolic parameters are indeterminate.   2. Right ventricular systolic function is moderately reduced. The right ventricular size is normal. There is moderately  elevated pulmonary artery systolic pressure. The estimated right ventricular systolic pressure is 45.7 mmHg.   3. Left atrial size was severely dilated.   4. A small pericardial effusion is present.   5. The mitral valve is normal in structure. Severe mitral valve regurgitation. No evidence of mitral stenosis.   6. Tricuspid valve regurgitation is moderate to severe.   7. The aortic valve is normal in structure. Aortic valve regurgitation is not visualized. No aortic stenosis is present.   8. The inferior vena cava is normal in size with greater than 50% respiratory variability, suggesting right atrial pressure of 3 mmHg.    Assessment and Plan    #) parox AFib #) high risk medication use - amiodarone S/p ablation 07/2023 with Dr. Lalla Brothers Maintaining sinus rhythm since as far as patient can tell, historically asymptomatic of arrhythmia Continue 200mg  amiodarone - will discuss with MD whether to continue.  Continue 12.5mg  coreg BID LFT stable TSH with hypothyroid, meds adjusted and has PCP follow-up for further mgmt   #) Hypercoag d/t parox afib CHA2DS2-VASc Score = at least 5 [CHF History: 1, HTN History: 1, Diabetes History: 0, Stroke History: 0, Vascular Disease History: 1, Age Score: 1, Gender Score: 1].  Therefore, the patient's annual risk of stroke is 7.2 %.    Stroke ppx - warfarin, managed at [facility] office Last INR subtherapeutic, discussed requirement for 4 weekly therapeutic INRs prior to LAAO Discussed stable intake of vitamin K-rich foods   #) HFimpEF Follows with HF team Appears euvolemic NICM thought d/t tachyarrhythmia, so need to maintain sinus rhythm GDMT includes coreg 18.75 BID, farxiga 10mg  Hydral for BP No Ace/arb/arni or spiro with CKD Continue torsemide 60/40 alternative days     Current medicines are reviewed at length with the patient today.   The patient has concerns regarding her medicines.  The following changes were made today:   None   Labs/  tests ordered today include:  Orders Placed This Encounter  Procedures   EKG 12-Lead     Disposition: Follow up with Dr. Lalla Brothers or EP APP as usual post procedure (LAAO) Continue weekly INR check at [facility] office   Signed, Sherie Don, NP  11/10/23  1:18 PM  Electrophysiology [facility]    Addendum: Discussed asymptomatic Afib history with Dr. Lalla Brothers, who recommended ILR implant. Could be done at same time as LAAO. I called patient and discussed it. Reviewed risks/benefits, including monthly monitoring fee. Patient verbalized understanding and wished to proceed.

## 2023-11-10 ENCOUNTER — Ambulatory Visit: Payer: Medicare Other | Attending: Cardiology | Admitting: Cardiology

## 2023-11-10 VITALS — BP 188/100 | HR 89 | Ht 65.0 in | Wt 121.4 lb

## 2023-11-10 DIAGNOSIS — Z7901 Long term (current) use of anticoagulants: Secondary | ICD-10-CM | POA: Insufficient documentation

## 2023-11-10 DIAGNOSIS — I5022 Chronic systolic (congestive) heart failure: Secondary | ICD-10-CM | POA: Diagnosis present

## 2023-11-10 DIAGNOSIS — D6869 Other thrombophilia: Secondary | ICD-10-CM | POA: Diagnosis present

## 2023-11-10 DIAGNOSIS — Z79899 Other long term (current) drug therapy: Secondary | ICD-10-CM | POA: Diagnosis not present

## 2023-11-10 DIAGNOSIS — I480 Paroxysmal atrial fibrillation: Secondary | ICD-10-CM | POA: Insufficient documentation

## 2023-11-10 MED ORDER — CARVEDILOL 12.5 MG PO TABS: 18.7500 mg | ORAL_TABLET | Freq: Two times a day (BID) | ORAL | 3 refills | Status: DC

## 2023-11-10 NOTE — Patient Instructions (Signed)
 Medication Instructions:  The current medical regimen is effective;  continue present plan and medications.  *If you need a refill on your cardiac medications before your next appointment, please call your pharmacy*   Follow-Up: At [facility], you and your health needs are our priority.  As part of our continuing mission to provide you with exceptional heart care, we have created designated Provider Care Teams.  These Care Teams include your primary Cardiologist (physician) and Advanced Practice Providers (APPs -  Physician Assistants and Nurse Practitioners) who all work together to provide you with the care you need, when you need it.  We recommend signing up for the patient portal called "MyChart".  Sign up information is provided on this After Visit Summary.  MyChart is used to connect with patients for Virtual Visits (Telemedicine).  Patients are able to view lab/test results, encounter notes, upcoming appointments, etc.  Non-urgent messages can be sent to your provider as well.   To learn more about what you can do with MyChart, go to [email or web address].    Your next appointment:   Keep scheduled appointment.

## 2023-11-12 ENCOUNTER — Ambulatory Visit: Attending: Cardiovascular Disease

## 2023-11-12 DIAGNOSIS — Z7901 Long term (current) use of anticoagulants: Secondary | ICD-10-CM | POA: Diagnosis not present

## 2023-11-12 DIAGNOSIS — I480 Paroxysmal atrial fibrillation: Secondary | ICD-10-CM | POA: Diagnosis present

## 2023-11-12 LAB — POCT INR: INR: 1.5 — AB (ref 2.0–3.0)

## 2023-11-12 NOTE — Patient Instructions (Signed)
 Take another 0.5 tablet today only then Increase to 1.5 tablets Daily, except 1 tablet on Tuesday and Thursday.  INR in 1 week.  Watchman 4/24 Weekly INRs [phone]

## 2023-11-13 ENCOUNTER — Other Ambulatory Visit: Payer: Self-pay

## 2023-11-13 MED ORDER — CARVEDILOL 12.5 MG PO TABS: 12.5000 mg | ORAL_TABLET | Freq: Two times a day (BID) | ORAL | 3 refills | Status: DC

## 2023-11-13 MED ORDER — CARVEDILOL 12.5 MG PO TABS: 18.7500 mg | ORAL_TABLET | Freq: Two times a day (BID) | ORAL | 3 refills | Status: DC

## 2023-11-13 NOTE — Addendum Note (Signed)
 Addended by: Theresia Bough on: 11/13/2023 02:05 PM   Modules accepted: Orders

## 2023-11-13 NOTE — Progress Notes (Signed)
 Carvedilol refill sent per fax request.

## 2023-11-13 NOTE — Progress Notes (Signed)
 Pharmacy called to iron out script Coreg 12.5 BID sent by [facility] 3/20 Coreg 18.75 BID sent by Clare Gandy 3/17   Per Shirlee Latch ok to continue 18.75 BID Med list updated and pharmacy aware during call

## 2023-11-18 ENCOUNTER — Other Ambulatory Visit: Payer: Self-pay | Admitting: Cardiology

## 2023-11-18 ENCOUNTER — Telehealth: Payer: Self-pay | Admitting: Cardiology

## 2023-11-18 MED ORDER — WARFARIN SODIUM 2.5 MG PO TABS: ORAL_TABLET | ORAL | 1 refills | Status: DC

## 2023-11-18 NOTE — Telephone Encounter (Signed)
 Refill request for warfarin:  Last INR was 1.5 on 11/12/23 Next INR due 11/19/23 LOV was 11/10/23  Refill approved.

## 2023-11-18 NOTE — Telephone Encounter (Signed)
*  STAT* If patient is at the pharmacy, call can be transferred to refill team.   1. Which medications need to be refilled? (please list name of each medication and dose if known) warfarin (COUMADIN) 2.5 MG tablet    2. Would you like to learn more about the convenience, safety, & potential cost savings by using the [facility]?      3. Are you open to using the [facility] (Type [facility].   4. Which pharmacy/location (including street and city if local pharmacy) is medication to be sent to?[facility] [phone] - [place], [place] - [address]    5. Do they need a 30 day or 90 day supply? 90

## 2023-11-19 ENCOUNTER — Ambulatory Visit: Attending: Cardiovascular Disease

## 2023-11-19 DIAGNOSIS — I4891 Unspecified atrial fibrillation: Secondary | ICD-10-CM | POA: Diagnosis present

## 2023-11-19 DIAGNOSIS — I480 Paroxysmal atrial fibrillation: Secondary | ICD-10-CM | POA: Insufficient documentation

## 2023-11-19 DIAGNOSIS — Z7901 Long term (current) use of anticoagulants: Secondary | ICD-10-CM | POA: Diagnosis present

## 2023-11-19 LAB — POCT INR: INR: 1.7 — AB (ref 2.0–3.0)

## 2023-11-19 MED ORDER — WARFARIN SODIUM 2.5 MG PO TABS: ORAL_TABLET | ORAL | 1 refills | Status: DC

## 2023-11-19 NOTE — Patient Instructions (Signed)
 Description   Start taking Warfarin 1.5 tablets daily.  INR in 1 week.  Watchman 4/24 Weekly INRs [phone]

## 2023-11-25 ENCOUNTER — Ambulatory Visit: Payer: Self-pay

## 2023-11-25 NOTE — Patient Outreach (Unsigned)
 Care Coordination   Follow Up Visit Note   11/26/2023 Name: Dawn Sherman MRN: [id number] DOB: [birth year]  Dawn Sherman is a 68 y.o. year old female who sees Dale Winters, MD for primary care. I spoke with  Dawn Sherman by phone today.  What matters to the patients health and wellness today?  Patient reports feeling well. Denies any shortness of breath or increase in swelling. She reports today's BP was 138/70, weight 118 lbs.  Patient states her coumadin levels got a little off when she had to take antibiotics for an upper respiratory issues. States she is going for labs today.  Patient states she is scheduled to have the watchman procedure on 12/19/23.  Patient states she is going through kidney transplant evaluation. Patient states she continues on O2 at hs.    Goals Addressed             This Visit's Progress    Continued improvement post hospitalization and management/ education of health conditions.       Interventions Today    Flowsheet Row Most Recent Value  Chronic Disease   Chronic disease during today's visit Congestive Heart Failure (CHF), Hypertension (HTN), Atrial Fibrillation (AFib)  General Interventions   General Interventions Discussed/Reviewed General Interventions Reviewed, Doctor Visits  [evaluation of current treatment plan for listed health conditions and patients adherence to plan as established by provider.  Assessed for HF/ atrial fibrillation symptoms and blood pressure readings.]  Doctor Visits Discussed/Reviewed Doctor Visits Reviewed  [name] upcoming provider visits. Advised to keep follow up visits with providers as recommended.]  Education Interventions   Education Provided Provided Education  [Discussed upcoming watchman procedfure scheduled for 12/19/23. Advised ongoing monitoring of blood pressure and to report blood pressure readings to provider outside of established parameters.]  Provided Verbal Education On Other  [Reviewed heart failure/ atrial  fibrillation symptoms. Advised ongoing home self monitoring of weight, pulse on oxygen level.  Discussed where patient is in the transplant process.]  Pharmacy Interventions   Pharmacy Dicussed/Reviewed Pharmacy Topics Reviewed  [medications reviewed and compliance discussed.]              SDOH assessments and interventions completed:  No     Care Coordination Interventions:  Yes, provided   Follow up plan: Follow up call scheduled for 12/24/23 at 2 pm    Encounter Outcome:  Patient Visit Completed   [name] RN, BSN, CCM [place]  [facility], Population Health Case Manager Phone: [phone]

## 2023-11-26 ENCOUNTER — Ambulatory Visit: Attending: Cardiovascular Disease

## 2023-11-26 ENCOUNTER — Other Ambulatory Visit (INDEPENDENT_AMBULATORY_CARE_PROVIDER_SITE_OTHER)

## 2023-11-26 DIAGNOSIS — E039 Hypothyroidism, unspecified: Secondary | ICD-10-CM

## 2023-11-26 DIAGNOSIS — I480 Paroxysmal atrial fibrillation: Secondary | ICD-10-CM | POA: Diagnosis present

## 2023-11-26 DIAGNOSIS — Z7901 Long term (current) use of anticoagulants: Secondary | ICD-10-CM

## 2023-11-26 LAB — POCT INR: INR: 2.3 (ref 2.0–3.0)

## 2023-11-26 LAB — T4, FREE: Free T4: 1.18 ng/dL (ref 0.60–1.60)

## 2023-11-26 LAB — TSH: TSH: 2.77 u[IU]/mL (ref 0.35–5.50)

## 2023-11-26 NOTE — Patient Instructions (Signed)
 Continue taking Warfarin 1.5 tablets daily.  INR in 1 week.  Watchman 4/24 Weekly INRs [phone]

## 2023-11-26 NOTE — Patient Instructions (Signed)
 Visit Information  Thank you for taking time to visit with me today. Please don't hesitate to contact me if I can be of assistance to you.   Following are the goals we discussed today:   Goals Addressed             This Visit's Progress    Continued improvement post hospitalization and management/ education of health conditions.       Interventions Today    Flowsheet Row Most Recent Value  Chronic Disease   Chronic disease during today's visit Congestive Heart Failure (CHF), Hypertension (HTN), Atrial Fibrillation (AFib)  General Interventions   General Interventions Discussed/Reviewed General Interventions Reviewed, Doctor Visits  [evaluation of current treatment plan for listed health conditions and patients adherence to plan as established by provider.  Assessed for HF/ atrial fibrillation symptoms and blood pressure readings.]  Doctor Visits Discussed/Reviewed Doctor Visits Reviewed  [name] upcoming provider visits. Advised to keep follow up visits with providers as recommended.]  Education Interventions   Education Provided Provided Education  [Discussed upcoming watchman procedfure scheduled for 12/19/23. Advised ongoing monitoring of blood pressure and to report blood pressure readings to provider outside of established parameters.]  Provided Verbal Education On Other  [Reviewed heart failure/ atrial fibrillation symptoms. Advised ongoing home self monitoring of weight, pulse on oxygen level.  Discussed where patient is in the transplant process.]  Pharmacy Interventions   Pharmacy Dicussed/Reviewed Pharmacy Topics Reviewed  [medications reviewed and compliance discussed.]              Our next appointment is by telephone on 12/24/23 at 2 pm  Please call the care guide team at [phone] if you need to cancel or reschedule your appointment.   If you are experiencing a Mental Health or Behavioral Health Crisis or need someone to talk to, please call the Suicide and  Crisis Lifeline: 988 call 1-800-273-TALK (toll free, 24 hour hotline)  The patient verbalized understanding of instructions, educational materials, and care plan provided today and agreed to receive a mailed copy of patient instructions, educational materials, and care plan.   [name] RN, BSN, CCM [employer], Population Health Case Manager Phone: [phone]

## 2023-11-27 ENCOUNTER — Ambulatory Visit
Admission: RE | Admit: 2023-11-27 | Discharge: 2023-11-27 | Disposition: A | Discharge status: Home / Self Care | Source: Ambulatory Visit | Attending: Cardiology | Admitting: Cardiology

## 2023-11-27 DIAGNOSIS — I5022 Chronic systolic (congestive) heart failure: Secondary | ICD-10-CM | POA: Diagnosis present

## 2023-11-27 DIAGNOSIS — I340 Nonrheumatic mitral (valve) insufficiency: Secondary | ICD-10-CM | POA: Diagnosis not present

## 2023-11-27 DIAGNOSIS — I110 Hypertensive heart disease with heart failure: Secondary | ICD-10-CM | POA: Insufficient documentation

## 2023-11-27 LAB — ECHOCARDIOGRAM COMPLETE
AR max vel: 2.04 cm2
AV Area VTI: 2.25 cm2
AV Area mean vel: 1.97 cm2
AV Mean grad: 3 mmHg
AV Peak grad: 5.2 mmHg
Ao pk vel: 1.14 m/s
Area-P 1/2: 2.68 cm2
MV VTI: 1.45 cm2
S' Lateral: 2.6 cm

## 2023-11-27 NOTE — Progress Notes (Signed)
*  PRELIMINARY RESULTS* Echocardiogram 2D Echocardiogram has been performed.  Dawn Sherman 11/27/2023, 9:33 AM

## 2023-11-28 ENCOUNTER — Other Ambulatory Visit: Payer: Self-pay | Admitting: Internal Medicine

## 2023-12-01 ENCOUNTER — Telehealth: Payer: Self-pay | Admitting: Cardiology

## 2023-12-03 ENCOUNTER — Ambulatory Visit

## 2023-12-05 ENCOUNTER — Telehealth: Payer: Self-pay

## 2023-12-05 NOTE — Telephone Encounter (Signed)
 Dawn Sherman

## 2023-12-07 ENCOUNTER — Inpatient Hospital Stay

## 2023-12-07 ENCOUNTER — Inpatient Hospital Stay
Admission: EM | Admit: 2023-12-07 | Discharge: 2023-12-11 | DRG: 309 | Disposition: A | Discharge status: Home / Self Care | Attending: Internal Medicine | Admitting: Internal Medicine

## 2023-12-07 ENCOUNTER — Other Ambulatory Visit: Payer: Self-pay

## 2023-12-07 DIAGNOSIS — N184 Chronic kidney disease, stage 4 (severe): Secondary | ICD-10-CM | POA: Diagnosis present

## 2023-12-07 DIAGNOSIS — F32A Depression, unspecified: Secondary | ICD-10-CM | POA: Diagnosis present

## 2023-12-07 DIAGNOSIS — D693 Immune thrombocytopenic purpura: Secondary | ICD-10-CM | POA: Diagnosis present

## 2023-12-07 DIAGNOSIS — I5020 Unspecified systolic (congestive) heart failure: Secondary | ICD-10-CM | POA: Diagnosis present

## 2023-12-07 DIAGNOSIS — I2489 Other forms of acute ischemic heart disease: Secondary | ICD-10-CM

## 2023-12-07 DIAGNOSIS — J439 Emphysema, unspecified: Secondary | ICD-10-CM

## 2023-12-07 DIAGNOSIS — R55 Syncope and collapse: Secondary | ICD-10-CM | POA: Diagnosis not present

## 2023-12-07 DIAGNOSIS — I441 Atrioventricular block, second degree: Principal | ICD-10-CM | POA: Diagnosis present

## 2023-12-07 DIAGNOSIS — Z79899 Other long term (current) drug therapy: Secondary | ICD-10-CM | POA: Diagnosis not present

## 2023-12-07 DIAGNOSIS — I130 Hypertensive heart and chronic kidney disease with heart failure and stage 1 through stage 4 chronic kidney disease, or unspecified chronic kidney disease: Secondary | ICD-10-CM | POA: Diagnosis present

## 2023-12-07 DIAGNOSIS — E039 Hypothyroidism, unspecified: Secondary | ICD-10-CM | POA: Diagnosis present

## 2023-12-07 DIAGNOSIS — Z7901 Long term (current) use of anticoagulants: Secondary | ICD-10-CM

## 2023-12-07 DIAGNOSIS — E782 Mixed hyperlipidemia: Secondary | ICD-10-CM | POA: Diagnosis present

## 2023-12-07 DIAGNOSIS — Z79890 Hormone replacement therapy (postmenopausal): Secondary | ICD-10-CM | POA: Diagnosis not present

## 2023-12-07 DIAGNOSIS — R001 Bradycardia, unspecified: Principal | ICD-10-CM | POA: Diagnosis present

## 2023-12-07 DIAGNOSIS — E861 Hypovolemia: Secondary | ICD-10-CM | POA: Diagnosis not present

## 2023-12-07 DIAGNOSIS — K529 Noninfective gastroenteritis and colitis, unspecified: Secondary | ICD-10-CM | POA: Diagnosis present

## 2023-12-07 DIAGNOSIS — I480 Paroxysmal atrial fibrillation: Secondary | ICD-10-CM | POA: Diagnosis present

## 2023-12-07 DIAGNOSIS — E860 Dehydration: Secondary | ICD-10-CM | POA: Diagnosis present

## 2023-12-07 DIAGNOSIS — I429 Cardiomyopathy, unspecified: Secondary | ICD-10-CM | POA: Diagnosis present

## 2023-12-07 DIAGNOSIS — R791 Abnormal coagulation profile: Secondary | ICD-10-CM | POA: Diagnosis present

## 2023-12-07 DIAGNOSIS — Z90721 Acquired absence of ovaries, unilateral: Secondary | ICD-10-CM

## 2023-12-07 DIAGNOSIS — N179 Acute kidney failure, unspecified: Secondary | ICD-10-CM | POA: Diagnosis present

## 2023-12-07 DIAGNOSIS — I5032 Chronic diastolic (congestive) heart failure: Secondary | ICD-10-CM | POA: Diagnosis present

## 2023-12-07 DIAGNOSIS — Z90710 Acquired absence of both cervix and uterus: Secondary | ICD-10-CM

## 2023-12-07 DIAGNOSIS — J449 Chronic obstructive pulmonary disease, unspecified: Secondary | ICD-10-CM | POA: Diagnosis present

## 2023-12-07 DIAGNOSIS — Z87891 Personal history of nicotine dependence: Secondary | ICD-10-CM

## 2023-12-07 DIAGNOSIS — R112 Nausea with vomiting, unspecified: Secondary | ICD-10-CM

## 2023-12-07 DIAGNOSIS — D696 Thrombocytopenia, unspecified: Secondary | ICD-10-CM

## 2023-12-07 DIAGNOSIS — I340 Nonrheumatic mitral (valve) insufficiency: Secondary | ICD-10-CM | POA: Diagnosis present

## 2023-12-07 LAB — COMPREHENSIVE METABOLIC PANEL WITH GFR
ALT: 13 U/L (ref 0–44)
AST: 15 U/L (ref 15–41)
Albumin: 4 g/dL (ref 3.5–5.0)
Alkaline Phosphatase: 56 U/L (ref 38–126)
Anion gap: 10 (ref 5–15)
BUN: 72 mg/dL — ABNORMAL HIGH (ref 8–23)
CO2: 21 mmol/L — ABNORMAL LOW (ref 22–32)
Calcium: 8.7 mg/dL — ABNORMAL LOW (ref 8.9–10.3)
Chloride: 104 mmol/L (ref 98–111)
Creatinine, Ser: 4.59 mg/dL — ABNORMAL HIGH (ref 0.44–1.00)
GFR, Estimated: 10 mL/min — ABNORMAL LOW (ref >60)
Glucose, Bld: 115 mg/dL — ABNORMAL HIGH (ref 70–99)
Potassium: 4.3 mmol/L (ref 3.5–5.1)
Sodium: 135 mmol/L (ref 135–145)
Total Bilirubin: 0.6 mg/dL (ref 0.0–1.2)
Total Protein: 6.6 g/dL (ref 6.5–8.1)

## 2023-12-07 LAB — URINALYSIS, ROUTINE W REFLEX MICROSCOPIC
Bacteria, UA: NONE SEEN
Bilirubin Urine: NEGATIVE
Glucose, UA: NEGATIVE mg/dL
Hgb urine dipstick: NEGATIVE
Ketones, ur: NEGATIVE mg/dL
Nitrite: NEGATIVE
Protein, ur: 100 mg/dL — AB
Specific Gravity, Urine: 1.013 (ref 1.005–1.030)
pH: 5 (ref 5.0–8.0)

## 2023-12-07 LAB — CBC WITH DIFFERENTIAL/PLATELET
Abs Immature Granulocytes: 0.14 10*3/uL — ABNORMAL HIGH (ref 0.00–0.07)
Basophils Absolute: 0 10*3/uL (ref 0.0–0.1)
Basophils Relative: 0 %
Eosinophils Absolute: 0 10*3/uL (ref 0.0–0.5)
Eosinophils Relative: 0 %
HCT: 45.8 % (ref 36.0–46.0)
Hemoglobin: 15.1 g/dL — ABNORMAL HIGH (ref 12.0–15.0)
Immature Granulocytes: 1 %
Lymphocytes Relative: 5 %
Lymphs Abs: 0.7 10*3/uL (ref 0.7–4.0)
MCH: 30.4 pg (ref 26.0–34.0)
MCHC: 33 g/dL (ref 30.0–36.0)
MCV: 92.3 fL (ref 80.0–100.0)
Monocytes Absolute: 1.3 10*3/uL — ABNORMAL HIGH (ref 0.1–1.0)
Monocytes Relative: 10 %
Neutro Abs: 11.8 10*3/uL — ABNORMAL HIGH (ref 1.7–7.7)
Neutrophils Relative %: 84 %
Platelets: 27 10*3/uL — CL (ref 150–400)
RBC: 4.96 MIL/uL (ref 3.87–5.11)
RDW: 14 % (ref 11.5–15.5)
Smear Review: UNDETERMINED
WBC: 14.1 10*3/uL — ABNORMAL HIGH (ref 4.0–10.5)
nRBC: 0 % (ref 0.0–0.2)

## 2023-12-07 LAB — TSH: TSH: 1.086 u[IU]/mL (ref 0.350–4.500)

## 2023-12-07 LAB — PROTIME-INR
INR: 8.4 (ref 0.8–1.2)
Prothrombin Time: 69.7 s — ABNORMAL HIGH (ref 11.4–15.2)

## 2023-12-07 LAB — TECHNOLOGIST SMEAR REVIEW
Plt Morphology: NORMAL
RBC MORPHOLOGY: NONE SEEN

## 2023-12-07 LAB — CBC
HCT: 41.3 % (ref 36.0–46.0)
Hemoglobin: 13.9 g/dL (ref 12.0–15.0)
MCH: 30.8 pg (ref 26.0–34.0)
MCHC: 33.7 g/dL (ref 30.0–36.0)
MCV: 91.4 fL (ref 80.0–100.0)
Platelets: 21 10*3/uL — CL (ref 150–400)
RBC: 4.52 MIL/uL (ref 3.87–5.11)
RDW: 14.1 % (ref 11.5–15.5)
WBC: 12.8 10*3/uL — ABNORMAL HIGH (ref 4.0–10.5)
nRBC: 0 % (ref 0.0–0.2)

## 2023-12-07 LAB — TROPONIN I (HIGH SENSITIVITY)
Troponin I (High Sensitivity): 30 ng/L — ABNORMAL HIGH (ref <18)
Troponin I (High Sensitivity): 27 ng/L — ABNORMAL HIGH (ref <18)

## 2023-12-07 LAB — MAGNESIUM: Magnesium: 2.2 mg/dL (ref 1.7–2.4)

## 2023-12-07 LAB — LACTATE DEHYDROGENASE: LDH: 154 U/L (ref 98–192)

## 2023-12-07 LAB — LIPASE, BLOOD: Lipase: 19 U/L (ref 11–51)

## 2023-12-07 MED ORDER — ACETAMINOPHEN 325 MG PO TABS: 650.0000 mg | ORAL_TABLET | Freq: Four times a day (QID) | ORAL | Status: DC | PRN

## 2023-12-07 MED ORDER — IPRATROPIUM-ALBUTEROL 0.5-2.5 (3) MG/3ML IN SOLN: 3.0000 mL | RESPIRATORY_TRACT | Status: DC | PRN

## 2023-12-07 MED ORDER — DEXAMETHASONE 6 MG PO TABS
40.0000 mg | ORAL_TABLET | Freq: Every day | ORAL | Status: DC
  Filled 2023-12-07: qty 1

## 2023-12-07 MED ORDER — ONDANSETRON HCL 4 MG/2ML IJ SOLN
4.0000 mg | Freq: Four times a day (QID) | INTRAMUSCULAR | Status: DC | PRN
  Administered 2023-12-07 – 2023-12-08 (×2): 4 mg via INTRAVENOUS
  Filled 2023-12-07 (×2): qty 2

## 2023-12-07 MED ORDER — WARFARIN - PHARMACIST DOSING INPATIENT
Freq: Every day | Status: DC
  Filled 2023-12-07: qty 1

## 2023-12-07 MED ORDER — VITAMIN K1 10 MG/ML IJ SOLN
2.5000 mg | Freq: Once | INTRAVENOUS | Status: AC
  Administered 2023-12-07: 2.5 mg via INTRAVENOUS
  Filled 2023-12-07: qty 0.25

## 2023-12-07 MED ORDER — BUPROPION HCL ER (XL) 150 MG PO TB24
150.0000 mg | ORAL_TABLET | Freq: Every day | ORAL | Status: DC
  Administered 2023-12-08 – 2023-12-11 (×4): 150 mg via ORAL
  Filled 2023-12-07 (×4): qty 1

## 2023-12-07 MED ORDER — ATORVASTATIN CALCIUM 20 MG PO TABS
40.0000 mg | ORAL_TABLET | Freq: Every day | ORAL | Status: DC
  Administered 2023-12-08 – 2023-12-11 (×4): 40 mg via ORAL
  Filled 2023-12-07 (×4): qty 2

## 2023-12-07 MED ORDER — VENLAFAXINE HCL ER 75 MG PO CP24
150.0000 mg | ORAL_CAPSULE | Freq: Every day | ORAL | Status: DC
  Administered 2023-12-08 – 2023-12-11 (×4): 150 mg via ORAL
  Filled 2023-12-07: qty 2
  Filled 2023-12-07: qty 1
  Filled 2023-12-07 (×2): qty 2

## 2023-12-07 MED ORDER — LACTATED RINGERS IV SOLN: INTRAVENOUS | Status: AC

## 2023-12-07 MED ORDER — LEVOTHYROXINE SODIUM 50 MCG PO TABS
75.0000 ug | ORAL_TABLET | Freq: Every day | ORAL | Status: DC
  Administered 2023-12-08 – 2023-12-11 (×4): 75 ug via ORAL
  Filled 2023-12-07 (×4): qty 1

## 2023-12-07 MED ORDER — LACTATED RINGERS IV BOLUS
1000.0000 mL | Freq: Once | INTRAVENOUS | Status: AC
  Administered 2023-12-07: 1000 mL via INTRAVENOUS

## 2023-12-07 MED ORDER — ACETAMINOPHEN 650 MG RE SUPP: 650.0000 mg | Freq: Four times a day (QID) | RECTAL | Status: DC | PRN

## 2023-12-07 MED ORDER — DEXAMETHASONE SODIUM PHOSPHATE 10 MG/ML IJ SOLN
INTRAMUSCULAR | Status: AC
  Administered 2023-12-07: 40 mg via INTRAVENOUS
  Filled 2023-12-07: qty 3

## 2023-12-07 MED ORDER — ATROPINE SULFATE 1 MG/10ML IJ SOSY: 1.0000 mg | PREFILLED_SYRINGE | INTRAMUSCULAR | Status: DC | PRN

## 2023-12-07 MED ORDER — ONDANSETRON HCL 4 MG PO TABS: 4.0000 mg | ORAL_TABLET | Freq: Four times a day (QID) | ORAL | Status: DC | PRN

## 2023-12-07 MED ORDER — SODIUM CHLORIDE 0.9% FLUSH
3.0000 mL | Freq: Two times a day (BID) | INTRAVENOUS | Status: DC
  Administered 2023-12-07 – 2023-12-11 (×9): 3 mL via INTRAVENOUS

## 2023-12-07 MED ORDER — DEXAMETHASONE SODIUM PHOSPHATE 10 MG/ML IJ SOLN
40.0000 mg | Freq: Once | INTRAMUSCULAR | Status: AC
  Administered 2023-12-07: 40 mg via INTRAVENOUS
  Filled 2023-12-07: qty 4

## 2023-12-07 MED ORDER — CALCITRIOL 0.25 MCG PO CAPS
0.2500 ug | ORAL_CAPSULE | Freq: Every day | ORAL | Status: DC
  Administered 2023-12-08 – 2023-12-11 (×4): 0.25 ug via ORAL
  Filled 2023-12-07 (×4): qty 1

## 2023-12-07 MED ORDER — PHYTONADIONE 5 MG PO TABS
2.5000 mg | ORAL_TABLET | Freq: Once | ORAL | Status: DC
  Filled 2023-12-07: qty 1

## 2023-12-07 MED ORDER — DEXAMETHASONE 6 MG PO TABS
40.0000 mg | ORAL_TABLET | Freq: Every day | ORAL | Status: DC
  Administered 2023-12-08 – 2023-12-11 (×4): 40 mg via ORAL
  Filled 2023-12-07 (×4): qty 1

## 2023-12-07 NOTE — Assessment & Plan Note (Signed)
 Patient presenting with 10 days of nausea, vomiting and diarrhea, concerning for viral etiology.  She also has a history of Cryptosporidium infection.  - GI and C. difficile panel - Zofran as needed - Hold off on Imodium pending infectious evaluation

## 2023-12-07 NOTE — Assessment & Plan Note (Signed)
 History of HFrEF with recovered EF in the setting of atrial fibrillation.  Most recent echocardiogram demonstrated recovered EF up to 55-60%.  She appears hypovolemic at this time.  - Hold home diuretics in the setting of AKI - Hold home GDMT in the setting of bradycardia and high risk for hypotension - Daily weights - Strict in and out

## 2023-12-07 NOTE — Assessment & Plan Note (Signed)
 Patient has a history of ITP back in 2023, treated with Decadron and IVIG with recovery of platelets.  CBC today with platelets of 27, 21 on repeat.  Discussed with oncology and will start Decadron.  - Start Decadron 40 mg daily, will start with IV today and transition to p.o. tomorrow if patient is able to tolerate p.o. intake - Oncology consulted; appreciate their recommendations - CBC daily

## 2023-12-07 NOTE — Assessment & Plan Note (Signed)
 Patient is presenting with new onset dizziness for the last 2-3 days with evidence of severe sinus bradycardia with first-degree AV block.  Previous history of atrial fibrillation on amiodarone and carvedilol.  New bradycardia may be vasovagal in the setting of diarrhea/vomiting versus medication mediated in the setting of severe AKI.  Discussed with Dr. Veryl Gottron.  Can trial atropine as needed and monitor overnight off of all cardiovascular medications.  If any hemodynamic changes occur, may require emergent pacemaker  - Cardiology consulted; appreciate their recommendations - Atropine trial if patient becomes dizzy or hypotensive - Hold home amiodarone and carvedilol - Telemetry monitoring - Pacer pads at bedside

## 2023-12-07 NOTE — ED Notes (Signed)
 Patient transported to CT

## 2023-12-07 NOTE — Assessment & Plan Note (Signed)
 No wheezing, shortness of breath or cough.  - DuoNebs as needed - Continue home bronchodilators

## 2023-12-07 NOTE — Consult Note (Signed)
 PHARMACY - ANTICOAGULATION CONSULT NOTE  Pharmacy Consult for warfarin Indication: atrial fibrillation  Allergies  Allergen Reactions   Sulfate Rash   Codeine Sulfate Nausea Only   Benicar [Olmesartan]     Talked with patient February 10, 2020, intolerance is unclear, tried several medications around that time and one of them gave her a rash but she is not clear which 1.   Amoxicillin Rash   Clindamycin/Lincomycin Rash   Entresto [Sacubitril-Valsartan] Other (See Comments)    hyperkalemia   Morphine And Codeine Rash   Penicillins Rash    Patient Measurements: Height: 5\' 5"  (165.1 cm) Weight: 55.1 kg (121 lb 7.6 oz) IBW/kg (Calculated) : 57 HEPARIN DW (KG): 55.1  Vital Signs: Temp: 97.5 F (36.4 C) (04/13 1233) Temp Source: Oral (04/13 1233) BP: 146/56 (04/13 1530) Pulse Rate: 39 (04/13 1530)  Labs: Recent Labs    12/07/23 1240 12/07/23 1523 12/07/23 1623  HGB 15.1*  --  13.9  HCT 45.8  --  41.3  PLT 27*  --  21*  LABPROT 69.7*  --   --   INR 8.4*  --   --   CREATININE 4.59*  --   --   TROPONINIHS  --  30*  --     Estimated Creatinine Clearance: 10.2 mL/min (A) (by C-G formula based on SCr of 4.59 mg/dL (H)).  Medical History: Past Medical History:  Diagnosis Date   B12 deficiency    Cardiomyopathy (HCC)    a. 09/2022 Echo: EF 35-40%; b. 09/2022 MV: apical defect w/ mild peri-infarct ischemia. EF 43%.   Chronic HFrEF (heart failure with reduced ejection fraction) (HCC)    a. 01/2020 Echo: EF 30-35%; b. 07/2020 Echo: EF 50-55%; c. 06/2022 Echo: EF 50-55%; d. 09/2022 Echo: EF 35-40%, globh HK, mod reduced RV fxn, sev BAE, mod MR.   CKD (chronic kidney disease), stage IV (HCC)    COPD (chronic obstructive pulmonary disease) (HCC)    Depression    Endometriosis    Hypertension    Mixed hyperlipidemia    Moderate mitral regurgitation    Normocytic anemia    PAF (paroxysmal atrial fibrillation) (HCC)    a. CHA2DS2VASc = 4-->eliquis 2.5 bid.   Paroxymsal Atrial  flutter (HCC)    Tobacco abuse     Medications:  Warfarin PTA dose; 3.75mg  po daily; TTR 33.7% Last dose 4/12 per med rec  Assessment: 68yo female admitted to ED with symptomatic bradycardia. PMH includes paroxysmal A-fib s/p ablation, COPD, and CKD. Pharmacy consulted to manage warfarin while inpatient.  Baseline DDI, amiodarone and warfarin.  Trial with Eliquis and Xarelto in the past but unable to afford medication.   Date INR Warfarin dose Comments  4/13 8.4 --- Admitted, Vitamin K 2.5mg  IV given. No bleeding but patient unable to tolerate PO         Goal of Therapy:  INR 2-3   Plan:  INR supra-therapeutic, IV Vitamin K per MD orders. HOLD warfarin for now Noted thrombocytopenia with Plt at 21, per conversation with MD, no s/sx of bleeding at this time.  CBC and INR with AM labs  Finnean Cerami Rodriguez-Guzman PharmD, BCPS 12/07/2023 4:53 PM

## 2023-12-07 NOTE — Assessment & Plan Note (Signed)
 In the setting of GI illness leading to profound dehydration.  - IV fluids as ordered - Hold nephrotoxic agents - Repeat BMP in the a.m.

## 2023-12-07 NOTE — Assessment & Plan Note (Signed)
 Patient is on Coumadin for atrial fibrillation, now presenting with INR of 8.2.  Likely due to acute viral illness.  - One-time dose of vitamin K given high risk for bleed in the setting of severe thrombocytopenia - Warfarin management per pharmacy dosing

## 2023-12-07 NOTE — ED Triage Notes (Signed)
 Pt here via [facility] C/O bradycardia. Pt has been sick with N/V/D for the past two weeks.  Pt had a fall on Thursday, denies hitting head. Pt takes warfarin, Pt missed her last INR appointment. Pt HR was 39 with EMS, Pt having nausea.   BP: 116/40 119 CBG 500 fluid given to 20g left forearm

## 2023-12-07 NOTE — Assessment & Plan Note (Signed)
 History of atrial fibrillation s/p ablation, currently on Coumadin, amiodarone and carvedilol.  She is planned to undergo watchman's in 2 weeks.  Currently in sinus rhythm with bradycardia.  - Hold Coumadin in the setting of supratherapeutic INR - Hold home amiodarone and carvedilol in the setting of bradycardia

## 2023-12-07 NOTE — H&P (Signed)
 History and Physical    Patient: Dawn Sherman:[id number] DOB: 18-Oct-1955 DOA: 12/07/2023 DOS: the patient was seen and examined on 12/07/2023 PCP: Dellar Fenton, MD  Patient coming from: Home  Chief Complaint:  Chief Complaint  Patient presents with   Bradycardia   HPI: Dawn Sherman is a 68 y.o. female with medical history significant of Chronic systolic heart failure, atrial fibrillation on amiodarone and Coumadin, hypothyroidism, ITP, iron deficiency anemia, hypertension, hyperlipidemia, COPD, chronic kidney disease stage IV who presents to the ED due to low heart rate.  Dawn Sherman states that for the last 10 days, she has been experiencing intermittent nausea, vomiting, and diarrhea.  Both her emesis and her diarrhea has been watery in nature with no blood.  She notes that occasionally her symptoms will improve but then recur.  She denies any frank abdominal pain, but states that the nausea is giving her some discomfort.  She denies any chest pain, shortness of breath, lower extremity swelling.  She notes that 3 days ago, she was standing in the kitchen when she suddenly got dizzy and fell to the ground.  She does not believe she lost consciousness but fell onto the trash can and bruised her side, arm, and leg.  Since then, she continues to have some dizziness.  She denies any headache, focal weakness.  She denies any fever, rhinorrhea, congestion.  ED course: On arrival to the ED, patient was normotensive at 114/56 with heart rate of 33.  She was saturating at 98% on room air.  She was afebrile at 97.5.  Initial workup notable for WBC of 14.1, platelets 27, creatinine 4.59, BUN 72, GFR of 10.  INR 8.4.  Urinalysis with small leukocytes, proteinuria but no bacteria.  EKG with sinus bradycardia.  Patient started on LR bolus and [facility] contacted for admission.   Review of Systems: As mentioned in the history of present illness. All other systems reviewed and are negative.  Past Medical  History:  Diagnosis Date   B12 deficiency    Cardiomyopathy (HCC)    a. 09/2022 Echo: EF 35-40%; b. 09/2022 MV: apical defect w/ mild peri-infarct ischemia. EF 43%.   Chronic HFrEF (heart failure with reduced ejection fraction) (HCC)    a. 01/2020 Echo: EF 30-35%; b. 07/2020 Echo: EF 50-55%; c. 06/2022 Echo: EF 50-55%; d. 09/2022 Echo: EF 35-40%, globh HK, mod reduced RV fxn, sev BAE, mod MR.   CKD (chronic kidney disease), stage IV (HCC)    COPD (chronic obstructive pulmonary disease) (HCC)    Depression    Endometriosis    Hypertension    Mixed hyperlipidemia    Moderate mitral regurgitation    Normocytic anemia    PAF (paroxysmal atrial fibrillation) (HCC)    a. CHA2DS2VASc = 4-->eliquis 2.5 bid.   Paroxymsal Atrial flutter (HCC)    Tobacco abuse    Past Surgical History:  Procedure Laterality Date   ABDOMINAL HYSTERECTOMY     ATRIAL FIBRILLATION ABLATION N/A 08/04/2023   Procedure: ATRIAL FIBRILLATION ABLATION;  Surgeon: Boyce Byes, MD;  Location: [facility];  Service: Cardiovascular;  Laterality: N/A;   BREAST BIOPSY Right 2015   neg-  FIBROADENOMA   CENTRAL LINE INSERTION  11/15/2022   Procedure: CENTRAL LINE INSERTION;  Surgeon: Sammy Crisp, MD;  Location: [facility];  Service: Cardiovascular;;   COLONOSCOPY WITH PROPOFOL N/A 11/24/2022   Procedure: COLONOSCOPY WITH PROPOFOL;  Surgeon: Shane Darling, MD;  Location: [facility];  Service: Endoscopy;  Laterality: N/A;   COLONOSCOPY WITH PROPOFOL N/A 11/23/2022   Procedure: COLONOSCOPY WITH PROPOFOL;  Surgeon: Shane Darling, MD;  Location: [facility];  Service: Endoscopy;  Laterality: N/A;   ESOPHAGOGASTRODUODENOSCOPY (EGD) WITH PROPOFOL N/A 11/23/2022   Procedure: ESOPHAGOGASTRODUODENOSCOPY (EGD) WITH PROPOFOL;  Surgeon: Shane Darling, MD;  Location: [facility];  Service: Endoscopy;  Laterality: N/A;   RIGHT HEART CATH N/A 11/15/2022   Procedure: RIGHT HEART CATH;  Surgeon:  Sammy Crisp, MD;  Location: [facility];  Service: Cardiovascular;  Laterality: N/A;   RIGHT/LEFT HEART CATH AND CORONARY ANGIOGRAPHY N/A 11/25/2022   Procedure: RIGHT/LEFT HEART CATH AND CORONARY ANGIOGRAPHY;  Surgeon: Wenona Hamilton, MD;  Location: [facility];  Service: Cardiovascular;  Laterality: N/A;   TAH/RSO  1999   secondary to bleeding and endometriosis (Dr Juliann Ochoa)   TRANSESOPHAGEAL ECHOCARDIOGRAM (CATH LAB) N/A 08/04/2023   Procedure: TRANSESOPHAGEAL ECHOCARDIOGRAM;  Surgeon: Boyce Byes, MD;  Location: [facility];  Service: Cardiovascular;  Laterality: N/A;   Social History:  reports that she has quit smoking. Her smoking use included cigarettes. She has a 10 pack-year smoking history. She has never used smokeless tobacco. She reports that she does not currently use alcohol. She reports that she does not use drugs.  Allergies  Allergen Reactions   Sulfate Rash   Codeine Sulfate Nausea Only   Benicar [Olmesartan]     Talked with patient February 10, 2020, intolerance is unclear, tried several medications around that time and one of them gave her a rash but she is not clear which 1.   Amoxicillin Rash   Clindamycin/Lincomycin Rash   Entresto [Sacubitril-Valsartan] Other (See Comments)    hyperkalemia   Morphine And Codeine Rash   Penicillins Rash    Family History  Problem Relation Age of Onset   Hypercholesterolemia Mother    Rheum arthritis Father    Rheum arthritis Daughter    Fibromyalgia Daughter    Breast cancer Neg Hx    Colon cancer Neg Hx     Prior to Admission medications   Medication Sig Start Date End Date Taking? Authorizing Provider  acetaminophen (TYLENOL) 325 MG tablet Take 650 mg by mouth every 6 (six) hours as needed (pain.).   Yes [provider]  amiodarone (PACERONE) 200 MG tablet TAKE ONE TABLET BY MOUTH EVERY DAY 12/01/23  Yes Dellar Fenton, MD  atorvastatin (LIPITOR) 40 MG tablet Take 1 tablet (40 mg  total) by mouth daily. 01/07/23  Yes Dellar Fenton, MD  buPROPion (WELLBUTRIN XL) 150 MG 24 hr tablet TAKE ONE TABLET BY MOUTH EVERY DAY 11/07/23  Yes Dellar Fenton, MD  calcitRIOL (ROCALTROL) 0.25 MCG capsule Take 0.25 mcg by mouth daily.   Yes [provider]  calcium carbonate (OS-CAL - DOSED IN MG OF ELEMENTAL CALCIUM) 1250 (500 Ca) MG tablet Take 1 tablet (500 mg of elemental calcium total) by mouth 3 (three) times daily with meals. 12/14/21  Yes Patel, Sona, MD  carvedilol (COREG) 12.5 MG tablet Take 1.5 tablets (18.75 mg total) by mouth 2 (two) times daily. 11/13/23  Yes Darlis Eisenmenger, MD  FARXIGA 10 MG TABS tablet Take 10 mg by mouth daily. 11/20/21  Yes [provider]  hydrALAZINE (APRESOLINE) 25 MG tablet Take 1 tablet (25 mg total) by mouth 3 (three) times daily. 10/15/23  Yes Darlis Eisenmenger, MD  isosorbide mononitrate (IMDUR) 30 MG 24 hr tablet Take 1 tablet (30 mg total) by mouth daily. 04/15/23 07/21/24  Yes Darlis Eisenmenger, MD  levothyroxine (SYNTHROID) 75 MCG tablet TAKE ONE TABLET BY MOUTH EVERY DAY (STOP 137 MCG DOSE) 12/01/23  Yes Dellar Fenton, MD  loratadine (CLARITIN) 10 MG tablet Take 10 mg by mouth daily.   Yes [provider]  omeprazole (PRILOSEC) 20 MG capsule Take 1 capsule (20 mg total) by mouth daily. 09/18/23  Yes Riddle, Suzann, NP  torsemide (DEMADEX) 20 MG tablet TAKE 60 MG ALTERNATING WITH 40 MG EVERY OTHER DAY 07/31/23  Yes Gollan, Timothy J, MD  venlafaxine XR (EFFEXOR-XR) 150 MG 24 hr capsule TAKE ONE CAPSULE BY MOUTH EVERY DAY 10/28/23  Yes Dellar Fenton, MD  warfarin (JANTOVEN) 2.5 MG tablet Take 1.5 tablets by mouth once daily or as directed by anticoagulation clinic. 11/19/23  Yes Gollan, Timothy J, MD  Tiotropium Bromide-Olodaterol 2.5-2.5 MCG/ACT AERS Inhale 2 puffs into the lungs daily. Patient not taking: Reported on 12/07/2023 03/12/23   Dellar Fenton, MD    Physical Exam: Vitals:   12/07/23 1315 12/07/23 1330 12/07/23 1530  12/07/23 1656  BP: (!) 130/45 (!) 121/48 (!) 146/56 (!) 144/39  Pulse: 67 72 (!) 39 (!) 37  Resp: 15 13 10 17   Temp:      TempSrc:      SpO2: (!) 89% 97% 98% 100%  Weight:      Height:       Physical Exam Vitals and nursing note reviewed.  Constitutional:      Appearance: She is normal weight. She is ill-appearing.  HENT:     Head: Normocephalic and atraumatic.     Mouth/Throat:     Mouth: Mucous membranes are dry.     Pharynx: Oropharynx is clear.  Eyes:     Extraocular Movements: Extraocular movements intact.     Pupils: Pupils are equal, round, and reactive to light.  Cardiovascular:     Rate and Rhythm: Regular rhythm. Bradycardia present.     Heart sounds: No murmur heard. Pulmonary:     Effort: Pulmonary effort is normal. No respiratory distress.     Breath sounds: Normal breath sounds. No wheezing, rhonchi or rales.  Abdominal:     General: Bowel sounds are normal. There is no distension.     Palpations: Abdomen is soft.     Tenderness: There is no abdominal tenderness. There is no guarding.  Musculoskeletal:     Cervical back: Neck supple.     Right lower leg: No edema.     Left lower leg: No edema.  Skin:    General: Skin is warm and dry.     Findings: Bruising (Left flank, right elbow, left lateral thigh) present.     Comments: No petechiae or purpura  Neurological:     General: No focal deficit present.     Mental Status: She is alert and oriented to person, place, and time. Mental status is at baseline.  Psychiatric:        Mood and Affect: Mood normal.        Behavior: Behavior normal.    Data Reviewed: CBC with WBC of 14.1, hemoglobin of 15.1, platelets of 27 Repeat CBC with WBC of 12.8, hemoglobin of 13.9, platelets of 21 CMP with sodium of 135, potassium 4.3, bicarb 21, glucose 115, BUN 72, creatinine 4.59, AST 15, ALT 13, total bilirubin 0.6 and GFR of 10 LDH 154 Troponin 30 INR 8.4 Urinalysis with leukocytes and proteinuria only  Initial EKG  with motion artifact.  Repeat EKG personally reviewed.  Sinus rhythm  with intraventricular conduction delay and significantly prolonged PR interval  No results found.  Results are pending, will review when available.  Assessment and Plan:  * Symptomatic bradycardia Patient is presenting with new onset dizziness for the last 2-3 days with evidence of severe sinus bradycardia with first-degree AV block.  Previous history of atrial fibrillation on amiodarone and carvedilol.  New bradycardia may be vasovagal in the setting of diarrhea/vomiting versus medication mediated in the setting of severe AKI.  Discussed with Dr. Veryl Gottron.  Can trial atropine as needed and monitor overnight off of all cardiovascular medications.  If any hemodynamic changes occur, may require emergent pacemaker  - Cardiology consulted; appreciate their recommendations - Atropine trial if patient becomes dizzy or hypotensive - Hold home amiodarone and carvedilol - Telemetry monitoring - Pacer pads at bedside  AKI (acute kidney injury) (HCC) In the setting of GI illness leading to profound dehydration.  - IV fluids as ordered - Hold nephrotoxic agents - Repeat BMP in the a.m.  Acute gastroenteritis Patient presenting with 10 days of nausea, vomiting and diarrhea, concerning for viral etiology.  She also has a history of Cryptosporidium infection.  - GI and C. difficile panel - Zofran as needed - Hold off on Imodium pending infectious evaluation  Idiopathic thrombocytopenic purpura (ITP) (HCC) Patient has a history of ITP back in 2023, treated with Decadron and IVIG with recovery of platelets.  CBC today with platelets of 27, 21 on repeat.  Discussed with oncology and will start Decadron.  - Start Decadron 40 mg daily, will start with IV today and transition to p.o. tomorrow if patient is able to tolerate p.o. intake - Oncology consulted; appreciate their recommendations - CBC daily  Supratherapeutic  INR Patient is on Coumadin for atrial fibrillation, now presenting with INR of 8.2.  Likely due to acute viral illness.  - One-time dose of vitamin K given high risk for bleed in the setting of severe thrombocytopenia - Warfarin management per pharmacy dosing  HFrEF (heart failure with reduced ejection fraction) (HCC) History of HFrEF with recovered EF in the setting of atrial fibrillation.  Most recent echocardiogram demonstrated recovered EF up to 55-60%.  She appears hypovolemic at this time.  - Hold home diuretics in the setting of AKI - Hold home GDMT in the setting of bradycardia and high risk for hypotension - Daily weights - Strict in and out  Paroxysmal atrial fibrillation (HCC) History of atrial fibrillation s/p ablation, currently on Coumadin, amiodarone and carvedilol.  She is planned to undergo watchman's in 2 weeks.  Currently in sinus rhythm with bradycardia.  - Hold Coumadin in the setting of supratherapeutic INR - Hold home amiodarone and carvedilol in the setting of bradycardia  Chronic obstructive pulmonary disease (HCC) No wheezing, shortness of breath or cough.  - DuoNebs as needed - Continue home bronchodilators  Advance Care Planning:   Code Status: Full Code   Consults: Cardiology, oncology  Family Communication: Patient's husband updated at bedside  Severity of Illness: The appropriate patient status for this patient is INPATIENT. Inpatient status is judged to be reasonable and necessary in order to provide the required intensity of service to ensure the patient's safety. The patient's presenting symptoms, physical exam findings, and initial radiographic and laboratory data in the context of their chronic comorbidities is felt to place them at high risk for further clinical deterioration. Furthermore, it is not anticipated that the patient will be medically stable for discharge from the hospital within 2 midnights of  admission.   * I certify that at the  point of admission it is my clinical judgment that the patient will require inpatient hospital care spanning beyond 2 midnights from the point of admission due to high intensity of service, high risk for further deterioration and high frequency of surveillance required.*  Author: Avi Body, MD 12/07/2023 5:03 PM  For on call review [email or web address].

## 2023-12-07 NOTE — ED Provider Notes (Signed)
 [facility] Provider Note    Event Date/Time   First MD Initiated Contact with Patient 12/07/23 1229     (approximate)   History   Bradycardia   HPI  Dawn Sherman is a 68 y.o. female who presents to the ED for evaluation of Bradycardia   I review a cardiology clinic visit from 1 month ago.  History of paroxysmal A-fib, COPD, CKD.  Has had ablation for A-fib.  Current anticoagulated on warfarin, amiodarone  Patient presents to the ED due to 1.5 weeks of recurrent N/V/D.  Reports continued nausea sensation without abdominal pain, fevers, urinary changes or syncope.  Her husband called 911 this morning because of her continued subacute illness, EMS found her bradycardic with rates in the 30s.  Patient denies any syncope  I reviewed the EMS EKG that seems to demonstrate a sinus bradycardia with a rate of 35 bpm.   Also review hematology clinic visit from earlier this year as well as admission from 2023 where she had ITP  Physical Exam   Triage Vital Signs: ED Triage Vitals  Encounter Vitals Group     BP      Systolic BP Percentile      Diastolic BP Percentile      Pulse      Resp      Temp      Temp src      SpO2      Weight      Height      Head Circumference      Peak Flow      Pain Score      Pain Loc      Pain Education      Exclude from Growth Chart     Most recent vital signs: Vitals:   12/07/23 1315 12/07/23 1330  BP: (!) 130/45 (!) 121/48  Pulse: 67 72  Resp: 15 13  Temp:    SpO2: (!) 89% 97%    General: Awake, no distress.  CV:  Good peripheral perfusion.  Resp:  Normal effort.  Abd:  No distention.  Soft and benign MSK:  No deformity noted.  Neuro:  No focal deficits appreciated. Other:     ED Results / Procedures / Treatments   Labs (all labs ordered are listed, but only abnormal results are displayed) Labs Reviewed  COMPREHENSIVE METABOLIC PANEL WITH GFR - Abnormal; Notable for the following components:       Result Value   CO2 21 (*)    Glucose, Bld 115 (*)    BUN 72 (*)    Creatinine, Ser 4.59 (*)    Calcium 8.7 (*)    GFR, Estimated 10 (*)    All other components within normal limits  CBC WITH DIFFERENTIAL/PLATELET - Abnormal; Notable for the following components:   WBC 14.1 (*)    Hemoglobin 15.1 (*)    Platelets 27 (*)    Neutro Abs 11.8 (*)    Monocytes Absolute 1.3 (*)    Abs Immature Granulocytes 0.14 (*)    All other components within normal limits  PROTIME-INR - Abnormal; Notable for the following components:   Prothrombin Time 69.7 (*)    INR 8.4 (*)    All other components within normal limits  MAGNESIUM  LIPASE, BLOOD  TECHNOLOGIST SMEAR REVIEW  LACTATE DEHYDROGENASE  URINALYSIS, ROUTINE W REFLEX MICROSCOPIC  HAPTOGLOBIN    EKG Sinus bradycardia with a rate of 34 bpm.  First-degree AV block without high-grade  block.  RADIOLOGY   Official radiology report(s): No results found.  PROCEDURES and INTERVENTIONS:  .Critical Care  Performed by: Arline Bennett, MD Authorized by: Arline Bennett, MD   Critical care provider statement:    Critical care time (minutes):  30   Critical care time was exclusive of:  Separately billable procedures and treating other patients   Critical care was necessary to treat or prevent imminent or life-threatening deterioration of the following conditions:  Circulatory failure and cardiac failure   Critical care was time spent personally by me on the following activities:  Development of treatment plan with patient or surrogate, discussions with consultants, evaluation of patient's response to treatment, examination of patient, ordering and review of laboratory studies, ordering and review of radiographic studies, ordering and performing treatments and interventions, pulse oximetry, re-evaluation of patient's condition and review of old charts .1-3 Lead EKG Interpretation  Performed by: Arline Bennett, MD Authorized by: Arline Bennett, MD      Interpretation: abnormal     ECG rate:  36   ECG rate assessment: bradycardic     Rhythm: sinus bradycardia     Ectopy: none     Conduction: normal     Medications  lactated ringers bolus 1,000 mL (1,000 mLs Intravenous New Bag/Given 12/07/23 1251)     IMPRESSION / MDM / ASSESSMENT AND PLAN / ED COURSE  I reviewed the triage vital signs and the nursing notes.  Differential diagnosis includes, but is not limited to, ITP, TTP, HUS, dehydration, C. difficile, AKI, hyperkalemia  {Patient presents with symptoms of an acute illness or injury that is potentially life-threatening.  Patient on Coumadin for A-fib presents with 10 days of diarrheal illness with signs of dehydration, AKI as well as thrombocytopenia and elevated INR.  Suspect recurrence of ITP, which she had about 2 years ago.  No schistocytes or signs of hemolysis or TTP.  Her elevated INR is noted but her hemoglobin is normal and she has no bleeding symptoms so if not provided vitamin K.  She has a benign abdomen without pain or tenderness.  Viral gastroenteritis seems likely as an underlying etiology.  Consult medicine who agrees to admit.  Sinus bradycardia is noted and improving with fluid resuscitation she is on a beta-blocker, carvedilol, but remains normotensive and asymptomatic at rest.  No evidence of high-grade AV block or cardiogenic shock  Clinical Course as of 12/07/23 1451  Sun Dec 07, 2023  1428 Consult with medicine who agrees to admit [DS]    Clinical Course User Index [DS] Arline Bennett, MD     FINAL CLINICAL IMPRESSION(S) / ED DIAGNOSES   Final diagnoses:  Sinus bradycardia  AKI (acute kidney injury) (HCC)  Nausea vomiting and diarrhea  Elevated INR  Thrombocytopenia (HCC)     Rx / DC Orders   ED Discharge Orders     None        Note:  This document was prepared using Dragon voice recognition software and may include unintentional dictation errors.   Arline Bennett, MD 12/07/23 [id number]

## 2023-12-08 DIAGNOSIS — R55 Syncope and collapse: Secondary | ICD-10-CM

## 2023-12-08 DIAGNOSIS — I5032 Chronic diastolic (congestive) heart failure: Secondary | ICD-10-CM

## 2023-12-08 DIAGNOSIS — I2489 Other forms of acute ischemic heart disease: Secondary | ICD-10-CM

## 2023-12-08 DIAGNOSIS — R001 Bradycardia, unspecified: Secondary | ICD-10-CM | POA: Diagnosis not present

## 2023-12-08 LAB — CBC
HCT: 41.1 % (ref 36.0–46.0)
Hemoglobin: 13.8 g/dL (ref 12.0–15.0)
MCH: 30.7 pg (ref 26.0–34.0)
MCHC: 33.6 g/dL (ref 30.0–36.0)
MCV: 91.3 fL (ref 80.0–100.0)
Platelets: 21 10*3/uL — CL (ref 150–400)
RBC: 4.5 MIL/uL (ref 3.87–5.11)
RDW: 13.9 % (ref 11.5–15.5)
WBC: 9 10*3/uL (ref 4.0–10.5)
nRBC: 0 % (ref 0.0–0.2)

## 2023-12-08 LAB — BASIC METABOLIC PANEL WITH GFR
Anion gap: 9 (ref 5–15)
BUN: 68 mg/dL — ABNORMAL HIGH (ref 8–23)
CO2: 23 mmol/L (ref 22–32)
Calcium: 8.2 mg/dL — ABNORMAL LOW (ref 8.9–10.3)
Chloride: 105 mmol/L (ref 98–111)
Creatinine, Ser: 3.92 mg/dL — ABNORMAL HIGH (ref 0.44–1.00)
GFR, Estimated: 12 mL/min — ABNORMAL LOW (ref >60)
Glucose, Bld: 139 mg/dL — ABNORMAL HIGH (ref 70–99)
Potassium: 4.5 mmol/L (ref 3.5–5.1)
Sodium: 137 mmol/L (ref 135–145)

## 2023-12-08 LAB — PROTIME-INR
INR: 1.1 (ref 0.8–1.2)
Prothrombin Time: 14.3 s (ref 11.4–15.2)

## 2023-12-08 LAB — FIBRINOGEN: Fibrinogen: 342 mg/dL (ref 210–475)

## 2023-12-08 LAB — VITAMIN B12: Vitamin B-12: 1017 pg/mL — ABNORMAL HIGH (ref 180–914)

## 2023-12-08 LAB — APTT: aPTT: 27 s (ref 24–36)

## 2023-12-08 LAB — IMMATURE PLATELET FRACTION: Immature Platelet Fraction: 15.7 % — ABNORMAL HIGH (ref 1.2–8.6)

## 2023-12-08 LAB — HIV ANTIBODY (ROUTINE TESTING W REFLEX): HIV Screen 4th Generation wRfx: NONREACTIVE

## 2023-12-08 LAB — FOLATE: Folate: 21.9 ng/mL (ref >5.9)

## 2023-12-08 MED ORDER — SODIUM CHLORIDE 0.9 % IV SOLN: INTRAVENOUS | Status: AC

## 2023-12-08 NOTE — ED Notes (Signed)
Pt ambulated to bathroom with strong steady gait.  ?

## 2023-12-08 NOTE — ED Notes (Signed)
 Pt ambulated in hallway. HR maintained at 60 to 65 while ambulating. No dizziness reported.

## 2023-12-08 NOTE — Consult Note (Signed)
 PHARMACY - ANTICOAGULATION CONSULT NOTE  Pharmacy Consult for warfarin Indication: atrial fibrillation  Allergies  Allergen Reactions   Sulfate Rash   Codeine Sulfate Nausea Only   Benicar [Olmesartan]     Talked with patient February 10, 2020, intolerance is unclear, tried several medications around that time and one of them gave her a rash but she is not clear which 1.   Amoxicillin Rash   Clindamycin/Lincomycin Rash   Entresto [Sacubitril-Valsartan] Other (See Comments)    hyperkalemia   Morphine And Codeine Rash   Penicillins Rash    Patient Measurements: Height: 5\' 5"  (165.1 cm) Weight: 55.1 kg (121 lb 7.6 oz) IBW/kg (Calculated) : 57 HEPARIN DW (KG): 55.1  Vital Signs: Temp: 98.3 F (36.8 C) (04/14 1102) Temp Source: Oral (04/14 1102) BP: 155/50 (04/14 1105) Pulse Rate: 118 (04/14 1105)  Labs: Recent Labs    12/07/23 1240 12/07/23 1523 12/07/23 1623 12/07/23 1725 12/08/23 0458  HGB 15.1*  --  13.9  --  13.8  HCT 45.8  --  41.3  --  41.1  PLT 27*  --  21*  --  21*  APTT  --   --   --   --  27  LABPROT 69.7*  --   --   --  14.3  INR 8.4*  --   --   --  1.1  CREATININE 4.59*  --   --   --  3.92*  TROPONINIHS  --  30*  --  27*  --     Estimated Creatinine Clearance: 11.9 mL/min (A) (by C-G formula based on SCr of 3.92 mg/dL (H)).  Medical History: Past Medical History:  Diagnosis Date   B12 deficiency    Cardiomyopathy (HCC)    a. 09/2022 Echo: EF 35-40%; b. 09/2022 MV: apical defect w/ mild peri-infarct ischemia. EF 43%.   Chronic HFrEF (heart failure with reduced ejection fraction) (HCC)    a. 01/2020 Echo: EF 30-35%; b. 07/2020 Echo: EF 50-55%; c. 06/2022 Echo: EF 50-55%; d. 09/2022 Echo: EF 35-40%, globh HK, mod reduced RV fxn, sev BAE, mod MR.   CKD (chronic kidney disease), stage IV (HCC)    COPD (chronic obstructive pulmonary disease) (HCC)    Depression    Endometriosis    Hypertension    Mixed hyperlipidemia    Moderate mitral regurgitation     Normocytic anemia    PAF (paroxysmal atrial fibrillation) (HCC)    a. CHA2DS2VASc = 4-->eliquis 2.5 bid.   Paroxymsal Atrial flutter (HCC)    Tobacco abuse     Medications:  Warfarin PTA dose; 3.75mg  po daily; TTR 33.7% Last dose 4/12 per med rec  Assessment: 68yo female admitted to ED with symptomatic bradycardia. PMH includes paroxysmal A-fib s/p ablation, COPD, and CKD. Pharmacy consulted to manage warfarin while inpatient.  Baseline DDI, amiodarone and warfarin.  Trial with Eliquis and Xarelto in the past but unable to afford medication.   Date INR Warfarin dose Comments  4/13 8.4 --- Admitted, Vitamin K 2.5mg  IV given. No bleeding but patient unable to tolerate PO  4/14 1.1 --- PLT remain low at 21, discussed with MD and will not resume warfarin or initiate anticoagulation bridging at this time    Goal of Therapy:  INR 2-3   Plan: INR 1.1 s/p vitamin K 2.5mg  IV on 4/13 Continue to hold warfarin and bridging anticoagulation given thrombocytopenia (PLT 21) Continue to monitor for signs & symptoms of bleeding CBC and INR with AM labs  Will  Deborha Falls, PharmD Clinical Pharmacist 12/08/2023 11:53 AM

## 2023-12-08 NOTE — Progress Notes (Signed)
 Progress Note   Patient: Dawn Sherman HQI:[id number] DOB: October 04, 1955 DOA: 12/07/2023     1 DOS: the patient was seen and examined on 12/08/2023   Brief hospital course: Dawn Sherman is a 68 y.o. female with medical history significant of Chronic systolic heart failure, atrial fibrillation on amiodarone and Coumadin, hypothyroidism, ITP, iron deficiency anemia, hypertension, hyperlipidemia, COPD, chronic kidney disease stage IV who presents to the ED due to low heart rate as well as acute on chronic renal failure.  Cardiologist as well as hematologist on board   Assessment and Plan: Symptomatic bradycardia Patient is presenting with new onset dizziness for the last 2-3 days prior to presentation.  Possible noted to be in the 30s on arrival Plan of care discussed with cardiology team who has consulted EP monitor on telemetry Will consider atropine trial if patient becomes dizzy or hypotensive Avoid AV nodal blocking agent Plan of care discussed with cardiology team Pacer pads at bedside   AKI (acute kidney injury) (HCC) In the setting of GI illness leading to profound dehydration. Renal function improving Continue IV fluid for 1 more day Monitor renal function Avoid nephrotoxic agents   Acute gastroenteritis Patient presenting with 10 days of nausea, vomiting and diarrhea, concerning for viral etiology.  She also has a history of Cryptosporidium infection. 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   Continue Decadron Oncologist consulted we appreciate input   Supratherapeutic INR-improved Patient is on Coumadin for atrial fibrillation, now presenting with INR of 8.2 on presentation likely in the setting of acute illness INR improved Holding warfarin at this time given  significant thrombocytopenia   HFrEF (heart failure with reduced ejection fraction) (HCC) History of HFrEF with recovered EF in the setting of atrial fibrillation.  Most recent echocardiogram demonstrated recovered EF up to 55-60%.  She appears hypovolemic at this time. Continue to hold home diuretics in the setting of AKI Hold home GDMT in the setting of bradycardia and high risk for hypotension Monitor input and output as well as daily weight   Paroxysmal atrial fibrillation (HCC) History of atrial fibrillation s/p ablation, currently on Coumadin, amiodarone and carvedilol.  She is planned to undergo watchman's in 2 weeks.  Currently in sinus rhythm with bradycardia.   - Hold Coumadin in the setting of supratherapeutic INR - Hold home amiodarone and carvedilol in the setting of bradycardia   Chronic obstructive pulmonary disease (HCC) No wheezing, shortness of breath or cough.   - DuoNebs as needed - Continue home bronchodilators   Advance Care Planning:   Code Status: Full Code    Consults: Cardiology, oncology   Family Communication: Patient's husband updated at bedside  Subjective:  Patient seen and examined at bedside this morning She admits to improvement in diarrhea as well as nausea and vomiting Abdominal pain much improved Denies chest pain or dizziness Heart rate improving   Physical Exam: General: Elderly female laying in bed in no acute distress Eyes:     Extraocular Movements: Extraocular movements intact.     Pupils: Pupils are equal, round, and reactive to light.  Cardiovascular:     Rate and Rhythm: Regular rhythm. Bradycardia present.  Pulmonary:     Effort: Pulmonary effort is normal. No respiratory distress.     Breath sounds: Normal breath sounds. No wheezing,  rhonchi or rales.  Abdominal: General: Bowel sounds are normal. There is no distension. guarding.  Musculoskeletal:     Cervical back: Neck supple.  Skin:    General: Skin is warm and dry.      Findings: Bruising (Left flank, right elbow, left lateral thigh) present.     Comments: No petechiae or purpura  Neurological:     General: No focal deficit present.     Mental Status: She is alert and oriented to person, place, and time. Mental status is at baseline.  Psychiatric:      Vitals:   12/08/23 0600 12/08/23 1102 12/08/23 1105 12/08/23 1400  BP: (!) 148/52  (!) 155/50 (!) 146/58  Pulse: (!) 33  (!) 118 (!) 39  Resp: 14  17 17   Temp:  98.3 F (36.8 C)    TempSrc:  Oral    SpO2: 99%  100% 98%  Weight:      Height:        Data Reviewed:  Amatory review showing bradycardia CT scan of the brain did not show any acute intracranial pathology    Latest Ref Rng & Units 12/08/2023    4:58 AM 12/07/2023    4:23 PM 12/07/2023   12:40 PM  CBC  WBC 4.0 - 10.5 K/uL 9.0  12.8  14.1   Hemoglobin 12.0 - 15.0 g/dL 21.3  08.6  57.8   Hematocrit 36.0 - 46.0 % 41.1  41.3  45.8   Platelets 150 - 400 K/uL 21  21  27         Latest Ref Rng & Units 12/08/2023    4:58 AM 12/07/2023   12:40 PM 10/27/2023   11:13 AM  BMP  Glucose 70 - 99 mg/dL 469  629  528   BUN 8 - 23 mg/dL 68  72  46   Creatinine 0.44 - 1.00 mg/dL 4.13  2.44  0.10   Sodium 135 - 145 mmol/L 137  135  140   Potassium 3.5 - 5.1 mmol/L 4.5  4.3  4.0   Chloride 98 - 111 mmol/L 105  104  109   CO2 22 - 32 mmol/L 23  21  22    Calcium 8.9 - 10.3 mg/dL 8.2  8.7  9.3      Time spent: 56 minutes  Author: Ezzard Holms, MD 12/08/2023 3:55 PM  For on call review [email or web address].

## 2023-12-08 NOTE — Consult Note (Addendum)
 Hematology/Oncology Consult note [facility] [phone] [phone]  Patient Care Team: Dale Rudolph, MD as PCP - General (Internal Medicine) Antonieta Iba, MD as PCP - Cardiology (Cardiology) Lanier Prude, MD as PCP - Electrophysiology (Cardiology) Lemar Livings, Merrily Pew, MD as Consulting Physician (General Surgery) Otho Ket, RN as [facility]   Name of the patient: Dawn Sherman  [id number]  1956/02/24    Reason for consult: Thrombocytopenia   Requesting physician: Dr. 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  Workup for ITP at that time including HIV hepatitis B hepatitis C and stool H. pylori antigen was negative.  Subsequently her platelet counts normalized.  She saw me recently for history of iron deficiency anemia as well and received IV iron.  She has baseline stage IV CKD and her creatinine runs around 2-3.  She has a history of congestive heart failure with an EF of 25 to 30%  She is admitted to the hospital with symptoms of nausea vomiting and diarrhea for the last 2 weeks.  INR was elevated at 8.4 without bleeding for which Coumadin has been on hold and she received a dose of vitamin K.  She was also found to be bradycardic and cardiology was consulted for the same.  CT head showed no acute abnormality.  ECOG PS- 2  Pain scale- 0   Review of systems- Review of Systems  Constitutional:  Positive for malaise/fatigue. Negative for chills, fever and weight loss.  HENT:  Negative for congestion, ear discharge and nosebleeds.   Eyes:  Negative for blurred vision.  Respiratory:  Negative for cough, hemoptysis, sputum production, shortness of breath and  wheezing.   Cardiovascular:  Negative for chest pain, palpitations, orthopnea and claudication.  Gastrointestinal:  Positive for nausea and vomiting. Negative for abdominal pain, blood in stool, constipation, diarrhea, heartburn and melena.  Genitourinary:  Negative for dysuria, flank pain, frequency, hematuria and urgency.  Musculoskeletal:  Negative for back pain, joint pain and myalgias.  Skin:  Negative for rash.  Neurological:  Negative for dizziness, tingling, focal weakness, seizures, weakness and headaches.  Endo/Heme/Allergies:  Does not bruise/bleed easily.  Psychiatric/Behavioral:  Negative for depression and suicidal ideas. The patient does not have insomnia.     Allergies  Allergen Reactions   Sulfate Rash   Codeine Sulfate Nausea Only   Benicar [Olmesartan]     Talked with patient February 10, 2020, intolerance is unclear, tried several medications around that time and one of them gave her a rash but she is not clear which 1.   Amoxicillin Rash   Clindamycin/Lincomycin Rash   Entresto [Sacubitril-Valsartan] Other (See Comments)    hyperkalemia   Morphine And Codeine Rash   Penicillins Rash    Patient Active Problem List   Diagnosis Date Noted   Symptomatic bradycardia 12/07/2023   Supratherapeutic INR 12/07/2023   Acute gastroenteritis 12/07/2023   Head congestion 10/26/2023   Atrial fibrillation (HCC) 10/08/2023   Long term (current) use of anticoagulants 10/08/2023   Congestive heart failure (HCC) 12/03/2022   Hypoxia 11/15/2022   Dilated cardiomyopathy (HCC) 11/15/2022   Mitral valve insufficiency 11/15/2022   Hyperkalemia 11/13/2022   Symptomatic anemia 11/12/2022   Pneumonia due to infectious  organism 09/30/2022   Pleural effusion due to CHF (congestive heart failure) (HCC) 09/30/2022   Acute on chronic diastolic CHF (congestive heart failure) (HCC) 09/23/2022   Chest pain 09/23/2022   Acute on chronic respiratory failure with hypoxia (HCC) 09/23/2022    (HFpEF) heart failure with preserved ejection fraction (HCC) 09/07/2022   Positive D dimer 09/07/2022   Depression with anxiety 09/07/2022   Hypothyroidism 09/07/2022   Epigastric pain 08/14/2022   Dyspnea 07/16/2022   Fatigue 07/16/2022   Acute on chronic congestive heart failure (HCC) 07/04/2022   Acute hypoxemic respiratory failure (HCC) 06/30/2022   Tachycardia 06/30/2022   Anemia 05/06/2022   HFrEF (heart failure with reduced ejection fraction) (HCC) 03/26/2022   Myocardial injury 03/26/2022   CKD (chronic kidney disease), stage IV (HCC) 03/26/2022   Iron deficiency anemia 03/26/2022   Generalized weakness 12/17/2021   Essential hypertension 12/17/2021   Dyslipidemia 12/17/2021   Depression, major, single episode, complete remission (HCC) 12/17/2021   GERD without esophagitis 12/17/2021   Chronic diastolic CHF (congestive heart failure) (HCC) 12/17/2021   AKI (acute kidney injury) (HCC) 12/16/2021   Second degree AV block    History of subarachnoid hemorrhage 12/10/2021   Hypokalemia 12/10/2021   Hyponatremia 12/10/2021   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   Persistent atrial fibrillation (HCC) 12/10/2021   Hypotension 12/10/2021   Swelling of lower extremity 10/28/2021   Prediabetes 08/19/2021   Chronic obstructive pulmonary disease (HCC) 11/22/2020   Paroxysmal atrial fibrillation (HCC) 08/12/2020   Dehydration    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HCC)    Acute on chronic combined systolic and diastolic CHF (congestive heart failure) (HCC) 02/10/2020   Leukocytosis 02/10/2020   HLD (hyperlipidemia) 02/10/2020   Elevated troponin 02/10/2020   GERD (gastroesophageal reflux disease) 02/10/2020   Acute respiratory failure with hypoxia (HCC) 02/10/2020   Abnormal mammogram 07/13/2019   History of alcohol abuse 07/31/2018   B12 deficiency 07/31/2018   Acute renal failure superimposed on stage 3b chronic kidney disease (HCC) 11/03/2016   Health care  maintenance 12/02/2014   Tobacco use 12/02/2014   Hypercholesterolemia 11/29/2014   Encounter for screening colonoscopy 12/16/2013   Breast mass 12/15/2012   Hypertension 07/05/2012   Depression, major, single episode, mild (HCC) 07/05/2012     Past Medical History:  Diagnosis Date   B12 deficiency    Cardiomyopathy (HCC)    a. 09/2022 Echo: EF 35-40%; b. 09/2022 MV: apical defect w/ mild peri-infarct ischemia. EF 43%.   Chronic HFrEF (heart failure with reduced ejection fraction) (HCC)    a. 01/2020 Echo: EF 30-35%; b. 07/2020 Echo: EF 50-55%; c. 06/2022 Echo: EF 50-55%; d. 09/2022 Echo: EF 35-40%, globh HK, mod reduced RV fxn, sev BAE, mod MR.   CKD (chronic kidney disease), stage IV (HCC)    COPD (chronic obstructive pulmonary disease) (HCC)    Depression    Endometriosis    Hypertension    Mixed hyperlipidemia    Moderate mitral regurgitation    Normocytic anemia    PAF (paroxysmal atrial fibrillation) (HCC)    a. CHA2DS2VASc = 4-->eliquis 2.5 bid.   Paroxymsal Atrial flutter (HCC)    Tobacco abuse      Past Surgical History:  Procedure Laterality Date   ABDOMINAL HYSTERECTOMY     ATRIAL FIBRILLATION ABLATION N/A 08/04/2023   Procedure: ATRIAL FIBRILLATION ABLATION;  Surgeon: Lanier Prude, MD;  Location: [facility] INVASIVE CV LAB;  Service: Cardiovascular;  Laterality: N/A;   BREAST BIOPSY Right 2015   neg-  FIBROADENOMA  CENTRAL LINE INSERTION  11/15/2022   Procedure: CENTRAL LINE INSERTION;  Surgeon: Sammy Crisp, MD;  Location: [facility] INVASIVE CV LAB;  Service: Cardiovascular;;   COLONOSCOPY WITH PROPOFOL N/A 11/24/2022   Procedure: COLONOSCOPY WITH PROPOFOL;  Surgeon: Shane Darling, MD;  Location: [facility] ENDOSCOPY;  Service: Endoscopy;  Laterality: N/A;   COLONOSCOPY WITH PROPOFOL N/A 11/23/2022   Procedure: COLONOSCOPY WITH PROPOFOL;  Surgeon: Shane Darling, MD;  Location: [facility] ENDOSCOPY;  Service: Endoscopy;  Laterality: N/A;   ESOPHAGOGASTRODUODENOSCOPY  (EGD) WITH PROPOFOL N/A 11/23/2022   Procedure: ESOPHAGOGASTRODUODENOSCOPY (EGD) WITH PROPOFOL;  Surgeon: Shane Darling, MD;  Location: [facility] ENDOSCOPY;  Service: Endoscopy;  Laterality: N/A;   RIGHT HEART CATH N/A 11/15/2022   Procedure: RIGHT HEART CATH;  Surgeon: Sammy Crisp, MD;  Location: [facility] INVASIVE CV LAB;  Service: Cardiovascular;  Laterality: N/A;   RIGHT/LEFT HEART CATH AND CORONARY ANGIOGRAPHY N/A 11/25/2022   Procedure: RIGHT/LEFT HEART CATH AND CORONARY ANGIOGRAPHY;  Surgeon: Wenona Hamilton, MD;  Location: [facility] INVASIVE CV LAB;  Service: Cardiovascular;  Laterality: N/A;   TAH/RSO  1999   secondary to bleeding and endometriosis (Dr Juliann Ochoa)   TRANSESOPHAGEAL ECHOCARDIOGRAM (CATH LAB) N/A 08/04/2023   Procedure: TRANSESOPHAGEAL ECHOCARDIOGRAM;  Surgeon: Boyce Byes, MD;  Location: [facility] INVASIVE CV LAB;  Service: Cardiovascular;  Laterality: N/A;    Social History   Socioeconomic History   Marital status: Married    Spouse name: Not on file   Number of children: Not on file   Years of education: Not on file   Highest education level: Not on file  Occupational History   Not on file  Tobacco Use   Smoking status: Former    Current packs/day: 0.25    Average packs/day: 0.3 packs/day for 40.0 years (10.0 ttl pk-yrs)    Types: Cigarettes   Smokeless tobacco: Never   Tobacco comments:    Patient quit smoking recently   Vaping Use   Vaping status: Never Used  Substance and Sexual Activity   Alcohol use: Not Currently    Alcohol/week: 0.0 standard drinks of alcohol    Comment: occasional   Drug use: No   Sexual activity: Not Currently  Other Topics Concern   Not on file  Social History Narrative   Lives in [place] with her husband.  She is retired from [employer]	[place].  She does not routinely exercise.   Social Drivers of Corporate investment banker Strain: Low Risk  (12/12/2022)   Overall Financial Resource Strain (CARDIA)    Difficulty of  Paying Living Expenses: Not hard at all  Food Insecurity: No Food Insecurity (12/08/2023)   Hunger Vital Sign    Worried About Running Out of Food in the Last Year: Never true    Ran Out of Food in the Last Year: Never true  Transportation Needs: No Transportation Needs (12/08/2023)   PRAPARE - Administrator, Civil Service (Medical): No    Lack of Transportation (Non-Medical): No  Physical Activity: Sufficiently Active (12/12/2022)   Exercise Vital Sign    Days of Exercise per Week: 7 days    Minutes of Exercise per Session: 30 min  Stress: No Stress Concern Present (12/12/2022)   [employer] of Occupational Health - Occupational Stress Questionnaire    Feeling of Stress : Not at all  Social Connections: Socially Integrated (12/08/2023)   Social Connection and Isolation Panel [NHANES]    Frequency of Communication with Friends and Family: More than three  times a week    Frequency of Social Gatherings with Friends and Family: Once a week    Attends Religious Services: More than 4 times per year    Active Member of [employer] or Organizations: Yes    Attends Engineer, structural: More than 4 times per year    Marital Status: Married  Catering manager Violence: Not At Risk (12/08/2023)   Humiliation, Afraid, Rape, and Kick questionnaire    Fear of Current or Ex-Partner: No    Emotionally Abused: No    Physically Abused: No    Sexually Abused: No     Family History  Problem Relation Age of Onset   Hypercholesterolemia Mother    Rheum arthritis Father    Rheum arthritis Daughter    Fibromyalgia Daughter    Breast cancer Neg Hx    Colon cancer Neg Hx      Current Facility-Administered Medications:    acetaminophen (TYLENOL) tablet 650 mg, 650 mg, Oral, Q6H PRN **OR** acetaminophen (TYLENOL) suppository 650 mg, 650 mg, Rectal, Q6H PRN, Avi Body, MD   atorvastatin (LIPITOR) tablet 40 mg, 40 mg, Oral, Daily, Basaraba, Iulia, MD, 40 mg at 12/08/23 1103    atropine 1 MG/10ML injection 1 mg, 1 mg, Intravenous, PRN, Basaraba, Iulia, MD   buPROPion (WELLBUTRIN XL) 24 hr tablet 150 mg, 150 mg, Oral, Daily, Basaraba, Iulia, MD, 150 mg at 12/08/23 1104   calcitRIOL (ROCALTROL) capsule 0.25 mcg, 0.25 mcg, Oral, Daily, Basaraba, Iulia, MD, 0.25 mcg at 12/08/23 1104   [COMPLETED] dexamethasone (DECADRON) injection 40 mg, 40 mg, Intravenous, Once, 40 mg at 12/07/23 1737 **FOLLOWED BY** dexamethasone (DECADRON) tablet 40 mg, 40 mg, Oral, Daily, Basaraba, Iulia, MD, 40 mg at 12/08/23 1105   ipratropium-albuterol (DUONEB) 0.5-2.5 (3) MG/3ML nebulizer solution 3 mL, 3 mL, Nebulization, Q4H PRN, Basaraba, Iulia, MD   levothyroxine (SYNTHROID) tablet 75 mcg, 75 mcg, Oral, Q0600, Avi Body, MD, 75 mcg at 12/08/23 0510   ondansetron (ZOFRAN) tablet 4 mg, 4 mg, Oral, Q6H PRN **OR** ondansetron (ZOFRAN) injection 4 mg, 4 mg, Intravenous, Q6H PRN, Avi Body, MD, 4 mg at 12/07/23 1728   sodium chloride flush (NS) 0.9 % injection 3 mL, 3 mL, Intravenous, Q12H, Guss Legacy, Iulia, MD, 3 mL at 12/08/23 1109   venlafaxine XR (EFFEXOR-XR) 24 hr capsule 150 mg, 150 mg, Oral, Daily, Basaraba, Iulia, MD, 150 mg at 12/08/23 1104   Warfarin - Pharmacist Dosing Inpatient, , Does not apply, q1600, Avi Body, MD  Current Outpatient Medications:    acetaminophen (TYLENOL) 325 MG tablet, Take 650 mg by mouth every 6 (six) hours as needed (pain.)., Disp: , Rfl:    amiodarone (PACERONE) 200 MG tablet, TAKE ONE TABLET BY MOUTH EVERY DAY, Disp: 30 tablet, Rfl: 1   atorvastatin (LIPITOR) 40 MG tablet, Take 1 tablet (40 mg total) by mouth daily., Disp: 90 tablet, Rfl: 1   buPROPion (WELLBUTRIN XL) 150 MG 24 hr tablet, TAKE ONE TABLET BY MOUTH EVERY DAY, Disp: 90 tablet, Rfl: 1   calcitRIOL (ROCALTROL) 0.25 MCG capsule, Take 0.25 mcg by mouth daily., Disp: , Rfl:    calcium carbonate (OS-CAL - DOSED IN MG OF ELEMENTAL CALCIUM) 1250 (500 Ca) MG tablet, Take 1 tablet (500 mg of  elemental calcium total) by mouth 3 (three) times daily with meals., Disp: 60 tablet, Rfl: 1   carvedilol (COREG) 12.5 MG tablet, Take 1.5 tablets (18.75 mg total) by mouth 2 (two) times daily., Disp: 90 tablet, Rfl: 3   FARXIGA 10  MG TABS tablet, Take 10 mg by mouth daily., Disp: , Rfl:    hydrALAZINE (APRESOLINE) 25 MG tablet, Take 1 tablet (25 mg total) by mouth 3 (three) times daily., Disp: 90 tablet, Rfl: 5   isosorbide mononitrate (IMDUR) 30 MG 24 hr tablet, Take 1 tablet (30 mg total) by mouth daily., Disp: 90 tablet, Rfl: 3   levothyroxine (SYNTHROID) 75 MCG tablet, TAKE ONE TABLET BY MOUTH EVERY DAY (STOP 137 MCG DOSE), Disp: 30 tablet, Rfl: 1   loratadine (CLARITIN) 10 MG tablet, Take 10 mg by mouth daily., Disp: , Rfl:    omeprazole (PRILOSEC) 20 MG capsule, Take 1 capsule (20 mg total) by mouth daily., Disp: , Rfl:    torsemide (DEMADEX) 20 MG tablet, TAKE 60 MG ALTERNATING WITH 40 MG EVERY OTHER DAY, Disp: 270 tablet, Rfl: 3   venlafaxine XR (EFFEXOR-XR) 150 MG 24 hr capsule, TAKE ONE CAPSULE BY MOUTH EVERY DAY, Disp: 90 capsule, Rfl: 1   warfarin (JANTOVEN) 2.5 MG tablet, Take 1.5 tablets by mouth once daily or as directed by anticoagulation clinic., Disp: 150 tablet, Rfl: 1   Tiotropium Bromide-Olodaterol 2.5-2.5 MCG/ACT AERS, Inhale 2 puffs into the lungs daily. (Patient not taking: Reported on 12/07/2023), Disp: 4 g, Rfl: 3   Physical exam:  Vitals:   12/08/23 0600 12/08/23 1102 12/08/23 1105 12/08/23 1400  BP: (!) 148/52  (!) 155/50 (!) 146/58  Pulse: (!) 33  (!) 118 (!) 39  Resp: 14  17 17   Temp:  98.3 F (36.8 C)    TempSrc:  Oral    SpO2: 99%  100% 98%  Weight:      Height:       Physical Exam Cardiovascular:     Rate and Rhythm: Regular rhythm. Bradycardia present.     Heart sounds: Normal heart sounds.  Pulmonary:     Effort: Pulmonary effort is normal.     Breath sounds: Normal breath sounds.  Abdominal:     General: Bowel sounds are normal.     Palpations:  Abdomen is soft.  Skin:    General: Skin is warm and dry.  Neurological:     Mental Status: She is alert and oriented to person, place, and time.           Latest Ref Rng & Units 12/08/2023    4:58 AM  CMP  Glucose 70 - 99 mg/dL 161   BUN 8 - 23 mg/dL 68   Creatinine 0.96 - 1.00 mg/dL 0.45   Sodium 409 - 811 mmol/L 137   Potassium 3.5 - 5.1 mmol/L 4.5   Chloride 98 - 111 mmol/L 105   CO2 22 - 32 mmol/L 23   Calcium 8.9 - 10.3 mg/dL 8.2       Latest Ref Rng & Units 12/08/2023    4:58 AM  CBC  WBC 4.0 - 10.5 K/uL 9.0   Hemoglobin 12.0 - 15.0 g/dL 91.4   Hematocrit 78.2 - 46.0 % 41.1   Platelets 150 - 400 K/uL 21     @IMAGES @  CT HEAD WO CONTRAST ( ) Result Date: 12/07/2023 CLINICAL DATA:  Syncope/presyncope, cerebrovascular cause suspected. Recent fall. EXAM: CT HEAD WITHOUT CONTRAST TECHNIQUE: Contiguous axial images were obtained from the base of the skull through the vertex without intravenous contrast. RADIATION DOSE REDUCTION: This exam was performed according to the departmental dose-optimization program which includes automated exposure control, adjustment of the mA and/or kV according to patient size and/or use of iterative reconstruction technique. COMPARISON:  Head CT 12/16/2021 and MRI 03/08/2022 FINDINGS: Brain: There is no evidence of an acute infarct, intracranial hemorrhage, mass, midline shift, or extra-axial fluid collection. Cerebral volume is within normal limits for age with normal size of the ventricles. Confluent hypodensities in the cerebral white matter and pons may have progressed and are nonspecific but compatible with severe chronic small vessel ischemic disease. Vascular: Calcified atherosclerosis at the skull base. No hyperdense vessel. Skull: No acute fracture or suspicious lesion. Sinuses/Orbits: Visualized paranasal sinuses and mastoid air cells are clear. Unremarkable orbits. Other: None. IMPRESSION: 1. No evidence of acute intracranial abnormality.  2. Severe chronic small vessel ischemic disease. Electronically Signed   By: Sebastian Ache M.D.   On: 12/07/2023 18:02   ECHOCARDIOGRAM COMPLETE Result Date: 11/27/2023    ECHOCARDIOGRAM REPORT   Patient Name:   DANYELA POSAS Date of Exam: 11/27/2023 Medical Rec #:  [id number]     Height:       65.0 in Accession #:    [id number]    Weight:       121.4 lb Date of Birth:  Mar 16, 1956    BSA:          1.600 m Patient Age:    67 years      BP:           188/100 mmHg Patient Gender: F             HR:           89 bpm. Exam Location:  [facility] Procedure: 2D Echo, 3D Echo, Cardiac Doppler, Color Doppler and Strain Analysis            (Both Spectral and Color Flow Doppler were utilized during            procedure). Indications:     Chronic systolic heart failure I50.22                  Congestive heart failure I50.9  History:         Patient has prior history of Echocardiogram examinations, most                  recent 10/16/2023. Cardiomyopathy; Risk Factors:Hypertension.                  Moderate mitral regurgitation, Paroxysmal Afib.  Sonographer:     Cristela Blue Referring Phys:  [id number] Laurey Morale Diagnosing Phys: Lorine Bears MD  Sonographer Comments: Global longitudinal strain was attempted. IMPRESSIONS  1. Left ventricular ejection fraction, by estimation, is 55 to 60%. The left ventricle has normal function. The left ventricle has no regional wall motion abnormalities. There is moderate left ventricular hypertrophy. Left ventricular diastolic parameters were normal.  2. Right ventricular systolic function is normal. The right ventricular size is normal. Tricuspid regurgitation signal is inadequate for assessing PA pressure.  3. Left atrial size was mildly dilated.  4. The mitral valve is normal in structure. Mild mitral valve regurgitation. No evidence of mitral stenosis.  5. The aortic valve is normal in structure. Aortic valve regurgitation is not visualized. No aortic stenosis is present.  6. The inferior vena cava  is normal in size with greater than 50% respiratory variability, suggesting right atrial pressure of 3 mmHg. Comparison(s): A prior study was performed on 11/13/2012. Changes from prior study are noted. The left ventricular function has improved. FINDINGS  Left Ventricle: Left ventricular ejection fraction, by estimation, is 55 to 60%. The left ventricle has normal function.  The left ventricle has no regional wall motion abnormalities. Global longitudinal strain performed but not reported based on interpreter judgement due to suboptimal tracking. 3D ejection fraction reviewed and evaluated as part of the interpretation. Alternate measurement of EF is felt to be most reflective of LV function. The left ventricular internal cavity size was normal in  size. There is moderate left ventricular hypertrophy. Left ventricular diastolic parameters were normal. Right Ventricle: The right ventricular size is normal. No increase in right ventricular wall thickness. Right ventricular systolic function is normal. Tricuspid regurgitation signal is inadequate for assessing PA pressure. Left Atrium: Left atrial size was mildly dilated. Right Atrium: Right atrial size was normal in size. Pericardium: There is no evidence of pericardial effusion. Mitral Valve: The mitral valve is normal in structure. Mild mitral annular calcification. Mild mitral valve regurgitation. No evidence of mitral valve stenosis. MV peak gradient, 3.6 mmHg. The mean mitral valve gradient is 2.0 mmHg. Tricuspid Valve: The tricuspid valve is normal in structure. Tricuspid valve regurgitation is trivial. No evidence of tricuspid stenosis. Aortic Valve: The aortic valve is normal in structure. Aortic valve regurgitation is not visualized. No aortic stenosis is present. Aortic valve mean gradient measures 3.0 mmHg. Aortic valve peak gradient measures 5.2 mmHg. Aortic valve area, by VTI measures 2.25 cm. Pulmonic Valve: The pulmonic valve was normal in structure.  Pulmonic valve regurgitation is not visualized. No evidence of pulmonic stenosis. Aorta: The aortic root is normal in size and structure. Venous: The inferior vena cava is normal in size with greater than 50% respiratory variability, suggesting right atrial pressure of 3 mmHg. IAS/Shunts: No atrial level shunt detected by color flow Doppler. Additional Comments: 3D was performed not requiring image post processing on an independent workstation and was indeterminate.  LEFT VENTRICLE PLAX 2D LVIDd:         4.00 cm   Diastology LVIDs:         2.60 cm   LV e' medial:    4.90 cm/s LV PW:         1.40 cm   LV E/e' medial:  13.7 LV IVS:        1.80 cm   LV e' lateral:   5.33 cm/s LVOT diam:     2.00 cm   LV E/e' lateral: 12.6 LV SV:         38 LV SV Index:   24 LVOT Area:     3.14 cm  RIGHT VENTRICLE RV Basal diam:  2.30 cm RV Mid diam:    1.80 cm RV S prime:     11.90 cm/s TAPSE (M-mode): 2.1 cm LEFT ATRIUM             Index        RIGHT ATRIUM          Index LA diam:        4.00 cm 2.50 cm/m   RA Area:     9.17 cm LA Vol (A2C):   63.4 ml 39.63 ml/m  RA Volume:   17.70 ml 11.06 ml/m LA Vol (A4C):   33.3 ml 20.81 ml/m LA Biplane Vol: 50.0 ml 31.25 ml/m  AORTIC VALVE AV Area (Vmax):    2.04 cm AV Area (Vmean):   1.97 cm AV Area (VTI):     2.25 cm AV Vmax:           114.00 cm/s AV Vmean:          69.700 cm/s AV VTI:  0.170 m AV Peak Grad:      5.2 mmHg AV Mean Grad:      3.0 mmHg LVOT Vmax:         74.20 cm/s LVOT Vmean:        43.700 cm/s LVOT VTI:          0.122 m LVOT/AV VTI ratio: 0.72  AORTA Ao Root diam: 3.00 cm MITRAL VALVE               TRICUSPID VALVE MV Area (PHT): 2.68 cm    TR Peak grad:   8.5 mmHg MV Area VTI:   1.45 cm    TR Vmax:        146.00 cm/s MV Peak grad:  3.6 mmHg MV Mean grad:  2.0 mmHg    SHUNTS MV Vmax:       0.95 m/s    Systemic VTI:  0.12 m MV Vmean:      61.8 cm/s   Systemic Diam: 2.00 cm MV Decel Time: 283 msec MV E velocity: 67.10 cm/s MV A velocity: 69.20 cm/s MV E/A  ratio:  0.97 Antionette Kirks MD Electronically signed by Antionette Kirks MD Signature Date/Time: 11/27/2023/12:56:09 PM    Final     Assessment and plan- Patient is a 68 y.o. female admitted for AKI on CKD.  Hematology consulted for thrombocytopenia.  Thrombocytopenia: Patient has a history of ITP back in 2023.  Back then her platelets had drifted down to the 20's and she had received 4 doses of Decadron and 2 doses of IVIG with normalization of her platelet counts since then.  She also sees me for iron deficiency anemia. Platelets a month ago were normal and presently her platelet count is 21 and today is day 2 of Decadron and likely represents recurrent ITP in the setting of viral illness.  IPF is elevated consistent with ITP.  LDH is normal and smear review is unremarkable with no schistocytes and therefore this is not TTP.  Serum creatinine over the last 1 year has been around 2.7 and went up to 4.5 and down to 3.9 presently.  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  I will continue to monitor her platelet counts as an outpatient.  If her platelet counts do not improve with Decadron I will consider offering Rituxan at that time.  Her hepatitis B testing in December 2024 was unremarkable.  Her Coumadin will need to be held until her platelet counts are more than 50. From hematology standpoint patient can be discharged as long as platelets are stable even if not improving and I will f/u closely upon discharge    Visit Diagnosis 1. Sinus bradycardia   2. AKI (acute kidney injury) (HCC)   3. Nausea vomiting and diarrhea   4. Elevated INR   5. Thrombocytopenia (HCC)     Dr. Seretha Dance, MD, MPH [facility] [phone] 12/08/2023

## 2023-12-08 NOTE — Consult Note (Addendum)
 ELECTROPHYSIOLOGY CONSULT NOTE    Patient ID: DILYN OSORIA MRN: [id number], DOB/AGE: 01/23/1956 68 y.o.  Admit date: 12/07/2023 Date of Consult: 12/08/2023  Primary Physician: Dale Las Piedras, MD Primary Cardiologist: Julien Nordmann, MD  Electrophysiologist: Dr. Lalla Brothers     Patient Profile: Dawn Sherman is a 68 y.o. female with a history of parox AFib, HFimpEF, COPD, MR, HTN, CKD - stage 4, hypothyroid, ITPwho is being seen today for the evaluation of 2:1 HB at the request of Dr. Okey Dupre.  HPI:  Dawn Sherman is a 68 y.o. female with PMH as above.  She is s/p recent AF ablation by Dr. Lalla Brothers, maintaining sinus at follow-up appointments on amiodarone. She is on warfarin for stroke ppx. She presented to [facility] ER 4/13 with c/o 10 days of N/V/D. She had episode of dizziness with ?syncope 3 days ago.   Initial labs in ER, labs notable for Cr up to 4.59 (baseline highly variable, appears to be 2.5-3), WBC up to 14.2, platelets down to 37, INR 8.4, TSH normal.  HR in 30s with 2:1 HB. Her home amiodarone and coreg have been held, last dose Saturday. She is unsure whether she took PM dose, but definitely took AM dose.   She feels better currently than she did when she presented to ER, has not had any further nausea or vomitting. She has walked a couple steps around the room without any dizziness or presyncope.   She denies chest pain, chest pressure, palpitations.    Labs Potassium4.5 (04/14 9147) Magnesium  2.2 (04/13 1240) Creatinine, ser  3.92* (04/14 0458) PLT  21* (04/14 0458) HGB  13.8 (04/14 0458) WBC 9.0 (04/14 0458) Troponin I (High Sensitivity)27* (04/13 1725).    Past Medical History:  Diagnosis Date   B12 deficiency    Cardiomyopathy (HCC)    a. 09/2022 Echo: EF 35-40%; b. 09/2022 MV: apical defect w/ mild peri-infarct ischemia. EF 43%.   Chronic HFrEF (heart failure with reduced ejection fraction) (HCC)    a. 01/2020 Echo: EF 30-35%; b. 07/2020 Echo: EF 50-55%; c.  06/2022 Echo: EF 50-55%; d. 09/2022 Echo: EF 35-40%, globh HK, mod reduced RV fxn, sev BAE, mod MR.   CKD (chronic kidney disease), stage IV (HCC)    COPD (chronic obstructive pulmonary disease) (HCC)    Depression    Endometriosis    Hypertension    Mixed hyperlipidemia    Moderate mitral regurgitation    Normocytic anemia    PAF (paroxysmal atrial fibrillation) (HCC)    a. CHA2DS2VASc = 4-->eliquis 2.5 bid.   Paroxymsal Atrial flutter (HCC)    Tobacco abuse      Surgical History:  Past Surgical History:  Procedure Laterality Date   ABDOMINAL HYSTERECTOMY     ATRIAL FIBRILLATION ABLATION N/A 08/04/2023   Procedure: ATRIAL FIBRILLATION ABLATION;  Surgeon: Lanier Prude, MD;  Location: [facility] INVASIVE CV LAB;  Service: Cardiovascular;  Laterality: N/A;   BREAST BIOPSY Right 2015   neg-  FIBROADENOMA   CENTRAL LINE INSERTION  11/15/2022   Procedure: CENTRAL LINE INSERTION;  Surgeon: Yvonne Kendall, MD;  Location: [facility] INVASIVE CV LAB;  Service: Cardiovascular;;   COLONOSCOPY WITH PROPOFOL N/A 11/24/2022   Procedure: COLONOSCOPY WITH PROPOFOL;  Surgeon: Regis Bill, MD;  Location: [facility] ENDOSCOPY;  Service: Endoscopy;  Laterality: N/A;   COLONOSCOPY WITH PROPOFOL N/A 11/23/2022   Procedure: COLONOSCOPY WITH PROPOFOL;  Surgeon: Regis Bill, MD;  Location: [facility] ENDOSCOPY;  Service: Endoscopy;  Laterality: N/A;  ESOPHAGOGASTRODUODENOSCOPY (EGD) WITH PROPOFOL N/A 11/23/2022   Procedure: ESOPHAGOGASTRODUODENOSCOPY (EGD) WITH PROPOFOL;  Surgeon: Regis Bill, MD;  Location: [facility] ENDOSCOPY;  Service: Endoscopy;  Laterality: N/A;   RIGHT HEART CATH N/A 11/15/2022   Procedure: RIGHT HEART CATH;  Surgeon: Yvonne Kendall, MD;  Location: [facility] INVASIVE CV LAB;  Service: Cardiovascular;  Laterality: N/A;   RIGHT/LEFT HEART CATH AND CORONARY ANGIOGRAPHY N/A 11/25/2022   Procedure: RIGHT/LEFT HEART CATH AND CORONARY ANGIOGRAPHY;  Surgeon: Iran Ouch, MD;  Location: [facility]  INVASIVE CV LAB;  Service: Cardiovascular;  Laterality: N/A;   TAH/RSO  1999   secondary to bleeding and endometriosis (Dr Haskel Khan)   TRANSESOPHAGEAL ECHOCARDIOGRAM (CATH LAB) N/A 08/04/2023   Procedure: TRANSESOPHAGEAL ECHOCARDIOGRAM;  Surgeon: Lanier Prude, MD;  Location: [facility] INVASIVE CV LAB;  Service: Cardiovascular;  Laterality: N/A;     (Not in a hospital admission)   Inpatient Medications:   atorvastatin  40 mg Oral Daily   buPROPion  150 mg Oral Daily   calcitRIOL  0.25 mcg Oral Daily   dexamethasone  40 mg Oral Daily   levothyroxine  75 mcg Oral Q0600   sodium chloride flush  3 mL Intravenous Q12H   venlafaxine XR  150 mg Oral Daily   Warfarin - Pharmacist Dosing Inpatient   Does not apply q1600    Allergies:  Allergies  Allergen Reactions   Sulfate Rash   Codeine Sulfate Nausea Only   Benicar [Olmesartan]     Talked with patient February 10, 2020, intolerance is unclear, tried several medications around that time and one of them gave her a rash but she is not clear which 1.   Amoxicillin Rash   Clindamycin/Lincomycin Rash   Entresto [Sacubitril-Valsartan] Other (See Comments)    hyperkalemia   Morphine And Codeine Rash   Penicillins Rash    Family History  Problem Relation Age of Onset   Hypercholesterolemia Mother    Rheum arthritis Father    Rheum arthritis Daughter    Fibromyalgia Daughter    Breast cancer Neg Hx    Colon cancer Neg Hx      Physical Exam: Vitals:   12/08/23 0300 12/08/23 0400 12/08/23 0455 12/08/23 0600  BP: (!) 141/62 (!) 153/54  (!) 148/52  Pulse: (!) 33 (!) 35  (!) 33  Resp: 18 10  14   Temp:   98.3 F (36.8 C)   TempSrc:   Oral   SpO2: 98% 99%  99%  Weight:      Height:        GEN- NAD, A&O x 3, normal affect HEENT: Normocephalic, atraumatic Lungs- CTAB, Normal effort.  Heart- Regular, bradycardic rate and rhythm, No M/G/R.  GI- Soft, NT, ND.  Extremities- No clubbing, cyanosis, or edema   Radiology/Studies: CT HEAD  WO CONTRAST ( ) Result Date: 12/07/2023 CLINICAL DATA:  Syncope/presyncope, cerebrovascular cause suspected. Recent fall. EXAM: CT HEAD WITHOUT CONTRAST TECHNIQUE: Contiguous axial images were obtained from the base of the skull through the vertex without intravenous contrast. RADIATION DOSE REDUCTION: This exam was performed according to the departmental dose-optimization program which includes automated exposure control, adjustment of the mA and/or kV according to patient size and/or use of iterative reconstruction technique. COMPARISON:  Head CT 12/16/2021 and MRI 03/08/2022 FINDINGS: Brain: There is no evidence of an acute infarct, intracranial hemorrhage, mass, midline shift, or extra-axial fluid collection. Cerebral volume is within normal limits for age with normal size of the ventricles. Confluent hypodensities in the cerebral white matter and pons  may have progressed and are nonspecific but compatible with severe chronic small vessel ischemic disease. Vascular: Calcified atherosclerosis at the skull base. No hyperdense vessel. Skull: No acute fracture or suspicious lesion. Sinuses/Orbits: Visualized paranasal sinuses and mastoid air cells are clear. Unremarkable orbits. Other: None. IMPRESSION: 1. No evidence of acute intracranial abnormality. 2. Severe chronic small vessel ischemic disease. Electronically Signed   By: Aundra Lee M.D.   On: 12/07/2023 18:02   ECHOCARDIOGRAM COMPLETE Result Date: 11/27/2023    ECHOCARDIOGRAM REPORT   Patient Name:   KIMBREE CASANAS Date of Exam: 11/27/2023 Medical Rec #:  [id number]     Height:       65.0 in Accession #:    [id number]    Weight:       121.4 lb Date of Birth:  1956/07/01    BSA:          1.600 m Patient Age:    67 years      BP:           188/100 mmHg Patient Gender: F             HR:           89 bpm. Exam Location:  [facility] Procedure: 2D Echo, 3D Echo, Cardiac Doppler, Color Doppler and Strain Analysis            (Both Spectral and Color Flow Doppler were  utilized during            procedure). Indications:     Chronic systolic heart failure I50.22                  Congestive heart failure I50.9  History:         Patient has prior history of Echocardiogram examinations, most                  recent 10/16/2023. Cardiomyopathy; Risk Factors:Hypertension.                  Moderate mitral regurgitation, Paroxysmal Afib.  Sonographer:     Broadus Canes Referring Phys:  [id number] Darlis Eisenmenger Diagnosing Phys: Antionette Kirks MD  Sonographer Comments: Global longitudinal strain was attempted. IMPRESSIONS  1. Left ventricular ejection fraction, by estimation, is 55 to 60%. The left ventricle has normal function. The left ventricle has no regional wall motion abnormalities. There is moderate left ventricular hypertrophy. Left ventricular diastolic parameters were normal.  2. Right ventricular systolic function is normal. The right ventricular size is normal. Tricuspid regurgitation signal is inadequate for assessing PA pressure.  3. Left atrial size was mildly dilated.  4. The mitral valve is normal in structure. Mild mitral valve regurgitation. No evidence of mitral stenosis.  5. The aortic valve is normal in structure. Aortic valve regurgitation is not visualized. No aortic stenosis is present.  6. The inferior vena cava is normal in size with greater than 50% respiratory variability, suggesting right atrial pressure of 3 mmHg. Comparison(s): A prior study was performed on 11/13/2012. Changes from prior study are noted. The left ventricular function has improved. FINDINGS  Left Ventricle: Left ventricular ejection fraction, by estimation, is 55 to 60%. The left ventricle has normal function. The left ventricle has no regional wall motion abnormalities. Global longitudinal strain performed but not reported based on interpreter judgement due to suboptimal tracking. 3D ejection fraction reviewed and evaluated as part of the interpretation. Alternate measurement of EF is felt to be most  reflective of LV function.  The left ventricular internal cavity size was normal in  size. There is moderate left ventricular hypertrophy. Left ventricular diastolic parameters were normal. Right Ventricle: The right ventricular size is normal. No increase in right ventricular wall thickness. Right ventricular systolic function is normal. Tricuspid regurgitation signal is inadequate for assessing PA pressure. Left Atrium: Left atrial size was mildly dilated. Right Atrium: Right atrial size was normal in size. Pericardium: There is no evidence of pericardial effusion. Mitral Valve: The mitral valve is normal in structure. Mild mitral annular calcification. Mild mitral valve regurgitation. No evidence of mitral valve stenosis. MV peak gradient, 3.6 mmHg. The mean mitral valve gradient is 2.0 mmHg. Tricuspid Valve: The tricuspid valve is normal in structure. Tricuspid valve regurgitation is trivial. No evidence of tricuspid stenosis. Aortic Valve: The aortic valve is normal in structure. Aortic valve regurgitation is not visualized. No aortic stenosis is present. Aortic valve mean gradient measures 3.0 mmHg. Aortic valve peak gradient measures 5.2 mmHg. Aortic valve area, by VTI measures 2.25 cm. Pulmonic Valve: The pulmonic valve was normal in structure. Pulmonic valve regurgitation is not visualized. No evidence of pulmonic stenosis. Aorta: The aortic root is normal in size and structure. Venous: The inferior vena cava is normal in size with greater than 50% respiratory variability, suggesting right atrial pressure of 3 mmHg. IAS/Shunts: No atrial level shunt detected by color flow Doppler. Additional Comments: 3D was performed not requiring image post processing on an independent workstation and was indeterminate.  LEFT VENTRICLE PLAX 2D LVIDd:         4.00 cm   Diastology LVIDs:         2.60 cm   LV e' medial:    4.90 cm/s LV PW:         1.40 cm   LV E/e' medial:  13.7 LV IVS:        1.80 cm   LV e' lateral:   5.33  cm/s LVOT diam:     2.00 cm   LV E/e' lateral: 12.6 LV SV:         38 LV SV Index:   24 LVOT Area:     3.14 cm  RIGHT VENTRICLE RV Basal diam:  2.30 cm RV Mid diam:    1.80 cm RV S prime:     11.90 cm/s TAPSE (M-mode): 2.1 cm LEFT ATRIUM             Index        RIGHT ATRIUM          Index LA diam:        4.00 cm 2.50 cm/m   RA Area:     9.17 cm LA Vol (A2C):   63.4 ml 39.63 ml/m  RA Volume:   17.70 ml 11.06 ml/m LA Vol (A4C):   33.3 ml 20.81 ml/m LA Biplane Vol: 50.0 ml 31.25 ml/m  AORTIC VALVE AV Area (Vmax):    2.04 cm AV Area (Vmean):   1.97 cm AV Area (VTI):     2.25 cm AV Vmax:           114.00 cm/s AV Vmean:          69.700 cm/s AV VTI:            0.170 m AV Peak Grad:      5.2 mmHg AV Mean Grad:      3.0 mmHg LVOT Vmax:         74.20 cm/s LVOT Vmean:  43.700 cm/s LVOT VTI:          0.122 m LVOT/AV VTI ratio: 0.72  AORTA Ao Root diam: 3.00 cm MITRAL VALVE               TRICUSPID VALVE MV Area (PHT): 2.68 cm    TR Peak grad:   8.5 mmHg MV Area VTI:   1.45 cm    TR Vmax:        146.00 cm/s MV Peak grad:  3.6 mmHg MV Mean grad:  2.0 mmHg    SHUNTS MV Vmax:       0.95 m/s    Systemic VTI:  0.12 m MV Vmean:      61.8 cm/s   Systemic Diam: 2.00 cm MV Decel Time: 283 msec MV E velocity: 67.10 cm/s MV A velocity: 69.20 cm/s MV E/A ratio:  0.97 Antionette Kirks MD Electronically signed by Antionette Kirks MD Signature Date/Time: 11/27/2023/12:56:09 PM    Final     EKG: 12/07/2023 - 2:1 HB with prolonged PR, rate 34 PR QRS 99   (personally reviewed)  TELEMETRY: 2:1 HB in 30-40s (personally reviewed)    Assessment/Plan: #) 2:1 HB #) syncope Presented to [facility] after > 1w of N/V/D, with episode of syncope last week.  Found to be in 2:1 HB on tele She was on coreg and amiodarone at admission, both have been held since 4/12 TSH normal Will have her walk around with nursing to determine chronotropic competency and symptoms. If abnormal, consider tx to [facility] for PPM placement vs continue to  monitor as coreg washes out   #) parox AFib #) supra therapeutic INR S/p AF ablation 07/2023 with Dr. Marven Slimmer No AF since ablation that she is aware of, but historically asymptomatic of arrhythmia Stop amio as above Holding warfarin at this time, appreciate pharm's assistance   #) HFimpEF Appears euvolemic on exam GDMT held for AKI and HB  #) AKI #) N/V/D - mgmt per primary team         For questions or updates, please contact [facility] Please consult [email or web address] for contact info under Cardiology/STEMI.  Signed, Suzann Riddle, NP  12/08/2023 8:22 AM  I have seen the patient via virtual consultation today.  The patient expressed their consent to participate in today's virtual consult. The patient was located at [facility] while I was located at [facility]  I have examined the patient and reviewed the above assessment and plan.    HPI: Dawn Sherman is a 67 y.o. female with a history of parox AFib, HFimpEF, COPD, MR, HTN, CKD - stage 4, hypothyroid, ITP who is being seen today for the evaluation of 2:1 AV block.  Patient has history of atrial fibrillation and underwent recent catheter ablation on 08/04/2023.  Patient has remained on oral amiodarone since the ablation.  She had been doing relatively well until the past few weeks when she developed nausea, vomiting, and diarrhea.  A few days ago she reports a syncopal event, associated with some dizziness and lightheadedness.  She was brought to the ED by EMS on 12/07/2023.  She was found to be bradycardic in the ED with AKI on CKD.  Cardiology was consulted.  Her beta-blocker and amiodarone have been held.  Today, she reports feeling somewhat improved.  At rest, she denies any dizziness or lightheadedness.  She has moved around somewhat in her room without issue.  Assessment: Dawn Sherman is a 68 y.o. female with  a history of parox AFib, HFimpEF, COPD, MR, HTN, CKD - stage 4, hypothyroid, ITP  who is being seen today for the evaluation of 2:1 AV block.  Bradycardia and 2 1 AV block has occurred in the setting of significant AKI, likely from an acute GI illness, while on amiodarone and carvedilol.  She is greater than 3 months out from an atrial fibrillation ablation.  She has not had any recurrence of atrial fibrillation.  It is reasonable to go ahead and stop her amiodarone, although this will take weeks to washout fully.  We are also holding her carvedilol and this too may take some time to washout in the setting of her significant AKI.  At this point, I feel it is reasonable to see if medications can wash out of her system and she can avoid implantation of permanent pacemaker.  Circumstances of her questionable syncopal event are uncertain: Could be secondary to orthostasis from dehydration in the setting of GI illness or vasovagal in setting of nausea, vomiting.  Thus far, no evidence of high-grade AV block.  We will continue to monitor her closely for recovery of her heart rates.  We will ask her to ambulate in the halls with nursing and see if she is able to conduct one-to-one.  If she demonstrates symptomatic bradycardia or chronotropic incompetence, then we will need to transfer her for pacemaker implant.  Plan:  - Continue to monitor on telemetry. - Hold amiodarone and carvedilol.  Hold all other AV nodal blocking agents and bradycardia inducing medications. - Continue warfarin.  A Caregility video call was used to complete today's consult.   Total time of encounter: 60 minutes total time of encounter, including face-to-face patient care, coordination of care and counseling regarding high complexity medical decision making.  [id number] - ER or Initial Inpatient 30 min [id number] - ER or Initial Inpatient 50 min [id number] - ER or Initial Inpatient 70 min  -GT modifier - via interactive audio and video telecommunication systems   Ardeen Kohler, MD 12/08/2023 11:36 PM

## 2023-12-08 NOTE — Consult Note (Signed)
 Cardiology Consultation   Patient ID: Dawn Sherman MRN: [id number]; DOB: 10-16-1955  Admit date: 12/07/2023 Date of Consult: 12/08/2023  PCP:  Dale , MD   [facility] Providers Cardiologist:  Julien Nordmann, MD  Electrophysiologist:  Lanier Prude, MD       Patient Profile:   Dawn Sherman is a 68 y.o. female with a hx of paroxysmal atrial fibrillation, HFimpEF, COPD, MR, hypertension, HLD, CKD IV, and hypothyroidism who is being seen 12/08/2023 for the evaluation of symptomatic bradycardia at the request of Dr. Huel Cote.  [name] was admitted 01/2021 with dyspnea and found to have EF 30 to 35%.  She was noted to have new onset atrial fibrillation and was placed on Eliquis.  Repeat echo 07/2020 showed improvement in EF to 50 to 55% with G1 DD.  In 11/2021, she suffered a fall in the context of GI illness and weakness and was found to have subarachnoid hemorrhage.  Overall DOAC was initially held but then subsequently when cleared by neurosurgery.  She required admissions 02/2022 and again 06/2022 with respiratory failure and volume overload.  Echo 06/2022 showed EF 50 to 55%.  She was readmitted 08/2022 with heart failure and pneumonia.  Repeat echo, which was performed during an episode of atrial flutter, showed a EF of 35 to 40%.  Following diuresis, she was discharged home.  Stress test performed in the outpatient setting was intermediate in the setting of an EF of 43% with apical infarct and mild peri-infarct ischemia.  She was a poor diagnostic catheterization candidate secondary to CKD stage IV.  She was admitted again in 10/2018 with progressive dyspnea.  Echo at that time showed EF 25 to 30% with global hypokinesis, RV moderately reduced, severe MR, moderate to severe TR.  R/LHC done 11/2022 showed normal coronaries and normal filling pressures with preserved EF.  Etiology of nonischemic cardiomyopathy remained unclear at that time and it was recommended that she  proceed with outpatient cardiac MRI which showed LV EF 47%, no myocardial delayed enhancement, normal RV size, RV EF 59%, mild MR (improved).  She underwent successful atrial fibrillation ablation 07/2023. Bedside TEE at that time showed EF 50 to 55%, G2 DD, mild MR.  She was seen in follow-up 08/2023 and remained in sinus rhythm.  She was switched from Eliquis to Xarelto due to cost.  Later switched to warfarin due to cost.  Follows with [facility].  Patient reports that she has been sick with nausea, vomiting, and diarrhea for the past 2 weeks. Denies blood in emesis and stools. She has become increasingly weak and fatigued.  On 4/11 she had and episode of lightheadedness with a syncopal episode causing her to fall and bruise her side, arm, and leg. She denies loss of consciousness. She denies chest pain, palpitations, shortness of breath, and fever. During this time she has been unable to keep her medications down. The last time she took her medications was the morning of 4/12. Yesterday 4/13 her husband decided to call EMS due to her ongoing illness. EMS found the patient to be bradycardic with HR 30s. In the ED, her rate was 33 bpm with otherwise normal vital signs.  CBC with white count of 14.1 and platelets 27.  BMP notable for creatinine 4.59, BUN 72, and GFR of 10.  INR 8.4. Troponin 30>27. UA with small leukocytosis and proteinuria without bacteria.  EKG shows sinus rhythm with 2-1 AV block and a rate of 34 bpm. CT  head without acute intracranial abnormality. She was started on a LR bolus in the ED. Also given one dose of vitamin K and started on decadron given severe thrombocytopenia. Cardiology was asked to consult for symptomatic bradycardia.   Past Medical History:  Diagnosis Date   B12 deficiency    Cardiomyopathy (HCC)    a. 09/2022 Echo: EF 35-40%; b. 09/2022 MV: apical defect w/ mild peri-infarct ischemia. EF 43%.   Chronic HFrEF (heart failure with reduced ejection fraction) (HCC)    a.  01/2020 Echo: EF 30-35%; b. 07/2020 Echo: EF 50-55%; c. 06/2022 Echo: EF 50-55%; d. 09/2022 Echo: EF 35-40%, globh HK, mod reduced RV fxn, sev BAE, mod MR.   CKD (chronic kidney disease), stage IV (HCC)    COPD (chronic obstructive pulmonary disease) (HCC)    Depression    Endometriosis    Hypertension    Mixed hyperlipidemia    Moderate mitral regurgitation    Normocytic anemia    PAF (paroxysmal atrial fibrillation) (HCC)    a. CHA2DS2VASc = 4-->eliquis 2.5 bid.   Paroxymsal Atrial flutter (HCC)    Tobacco abuse     Past Surgical History:  Procedure Laterality Date   ABDOMINAL HYSTERECTOMY     ATRIAL FIBRILLATION ABLATION N/A 08/04/2023   Procedure: ATRIAL FIBRILLATION ABLATION;  Surgeon: Lanier Prude, MD;  Location: [facility] INVASIVE CV LAB;  Service: Cardiovascular;  Laterality: N/A;   BREAST BIOPSY Right 2015   neg-  FIBROADENOMA   CENTRAL LINE INSERTION  11/15/2022   Procedure: CENTRAL LINE INSERTION;  Surgeon: Yvonne Kendall, MD;  Location: [facility] INVASIVE CV LAB;  Service: Cardiovascular;;   COLONOSCOPY WITH PROPOFOL N/A 11/24/2022   Procedure: COLONOSCOPY WITH PROPOFOL;  Surgeon: Regis Bill, MD;  Location: [facility] ENDOSCOPY;  Service: Endoscopy;  Laterality: N/A;   COLONOSCOPY WITH PROPOFOL N/A 11/23/2022   Procedure: COLONOSCOPY WITH PROPOFOL;  Surgeon: Regis Bill, MD;  Location: [facility] ENDOSCOPY;  Service: Endoscopy;  Laterality: N/A;   ESOPHAGOGASTRODUODENOSCOPY (EGD) WITH PROPOFOL N/A 11/23/2022   Procedure: ESOPHAGOGASTRODUODENOSCOPY (EGD) WITH PROPOFOL;  Surgeon: Regis Bill, MD;  Location: [facility] ENDOSCOPY;  Service: Endoscopy;  Laterality: N/A;   RIGHT HEART CATH N/A 11/15/2022   Procedure: RIGHT HEART CATH;  Surgeon: Yvonne Kendall, MD;  Location: [facility] INVASIVE CV LAB;  Service: Cardiovascular;  Laterality: N/A;   RIGHT/LEFT HEART CATH AND CORONARY ANGIOGRAPHY N/A 11/25/2022   Procedure: RIGHT/LEFT HEART CATH AND CORONARY ANGIOGRAPHY;  Surgeon: Iran Ouch, MD;  Location: [facility] INVASIVE CV LAB;  Service: Cardiovascular;  Laterality: N/A;   TAH/RSO  1999   secondary to bleeding and endometriosis (Dr Haskel Khan)   TRANSESOPHAGEAL ECHOCARDIOGRAM (CATH LAB) N/A 08/04/2023   Procedure: TRANSESOPHAGEAL ECHOCARDIOGRAM;  Surgeon: Lanier Prude, MD;  Location: [facility] INVASIVE CV LAB;  Service: Cardiovascular;  Laterality: N/A;       Inpatient Medications: Scheduled Meds:  atorvastatin  40 mg Oral Daily   buPROPion  150 mg Oral Daily   calcitRIOL  0.25 mcg Oral Daily   dexamethasone  40 mg Oral Daily   levothyroxine  75 mcg Oral Q0600   sodium chloride flush  3 mL Intravenous Q12H   venlafaxine XR  150 mg Oral Daily   Warfarin - Pharmacist Dosing Inpatient   Does not apply q1600   Continuous Infusions:  PRN Meds: acetaminophen **OR** acetaminophen, atropine, ipratropium-albuterol, ondansetron **OR** ondansetron (ZOFRAN) IV  Allergies:    Allergies  Allergen Reactions   Sulfate Rash   Codeine Sulfate Nausea Only   Benicar [  Olmesartan]     Talked with patient February 10, 2020, intolerance is unclear, tried several medications around that time and one of them gave her a rash but she is not clear which 1.   Amoxicillin Rash   Clindamycin/Lincomycin Rash   Entresto [Sacubitril-Valsartan] Other (See Comments)    hyperkalemia   Morphine And Codeine Rash   Penicillins Rash    Social History:   Social History   Socioeconomic History   Marital status: Married    Spouse name: Not on file   Number of children: Not on file   Years of education: Not on file   Highest education level: Not on file  Occupational History   Not on file  Tobacco Use   Smoking status: Former    Current packs/day: 0.25    Average packs/day: 0.3 packs/day for 40.0 years (10.0 ttl pk-yrs)    Types: Cigarettes   Smokeless tobacco: Never   Tobacco comments:    Patient quit smoking recently   Vaping Use   Vaping status: Never Used  Substance and Sexual  Activity   Alcohol use: Not Currently    Alcohol/week: 0.0 standard drinks of alcohol    Comment: occasional   Drug use: No   Sexual activity: Not Currently  Other Topics Concern   Not on file  Social History Narrative   Lives in [place] with her husband.  She is retired from [employer]	[place].  She does not routinely exercise.   Social Drivers of Corporate investment banker Strain: Low Risk  (12/12/2022)   Overall Financial Resource Strain (CARDIA)    Difficulty of Paying Living Expenses: Not hard at all  Food Insecurity: No Food Insecurity (12/12/2022)   Hunger Vital Sign    Worried About Running Out of Food in the Last Year: Never true    Ran Out of Food in the Last Year: Never true  Transportation Needs: No Transportation Needs (12/12/2022)   PRAPARE - Administrator, Civil Service (Medical): No    Lack of Transportation (Non-Medical): No  Physical Activity: Sufficiently Active (12/12/2022)   Exercise Vital Sign    Days of Exercise per Week: 7 days    Minutes of Exercise per Session: 30 min  Stress: No Stress Concern Present (12/12/2022)   [employer] of Occupational Health - Occupational Stress Questionnaire    Feeling of Stress : Not at all  Social Connections: Unknown (12/12/2022)   Social Connection and Isolation Panel [NHANES]    Frequency of Communication with Friends and Family: Not on file    Frequency of Social Gatherings with Friends and Family: Not on file    Attends Religious Services: Not on file    Active Member of Clubs or Organizations: Not on file    Attends Banker Meetings: Not on file    Marital Status: Married  Intimate Partner Violence: Not At Risk (12/12/2022)   Humiliation, Afraid, Rape, and Kick questionnaire    Fear of Current or Ex-Partner: No    Emotionally Abused: No    Physically Abused: No    Sexually Abused: No    Family History:    Family History  Problem Relation Age of Onset   Hypercholesterolemia  Mother    Rheum arthritis Father    Rheum arthritis Daughter    Fibromyalgia Daughter    Breast cancer Neg Hx    Colon cancer Neg Hx      ROS:  Please see the history of present  illness.     Physical Exam/Data:   Vitals:   12/08/23 0300 12/08/23 0400 12/08/23 0455 12/08/23 0600  BP: (!) 141/62 (!) 153/54  (!) 148/52  Pulse: (!) 33 (!) 35  (!) 33  Resp: 18 10  14   Temp:   98.3 F (36.8 C)   TempSrc:   Oral   SpO2: 98% 99%  99%  Weight:      Height:        Intake/Output Summary (Last 24 hours) at 12/08/2023 0934 Last data filed at 12/07/2023 1842 Gross per 24 hour  Intake 1049.3 ml  Output --  Net 1049.3 ml      12/07/2023   12:34 PM 11/10/2023    1:08 PM 11/05/2023    3:12 PM  Last 3 Weights  Weight (lbs) 121 lb 7.6 oz 121 lb 6.4 oz 123 lb 3.2 oz  Weight (kg) 55.1 kg 55.067 kg 55.883 kg     Body mass index is 20.21 kg/m.  General:  Well nourished, well developed, in no acute distress HEENT: normal Neck: no JVD Vascular: No carotid bruits; Distal pulses 2+ bilaterally Cardiac:  normal S1, S2; RRR; no murmur  Lungs:  clear to auscultation bilaterally, no wheezing, rhonchi or rales  Abd: soft, nontender, no hepatomegaly  Ext: no edema Skin: warm and dry  Psych:  Normal affect   EKG:  The EKG was personally reviewed and demonstrates:  Sinus bradycardia with 2:1 AV block, rate 34 bpm Telemetry:  Telemetry was personally reviewed and demonstrates:  Sinus bradycardia with 2:1 AV block, rates 30-50 bpm  Relevant CV Studies:  11/27/2023 Echo complete 1. Left ventricular ejection fraction, by estimation, is 55 to 60%. The  left ventricle has normal function. The left ventricle has no regional  wall motion abnormalities. There is moderate left ventricular hypertrophy.  Left ventricular diastolic  parameters were normal.   2. Right ventricular systolic function is normal. The right ventricular  size is normal. Tricuspid regurgitation signal is inadequate for assessing   PA pressure.   3. Left atrial size was mildly dilated.   4. The mitral valve is normal in structure. Mild mitral valve  regurgitation. No evidence of mitral stenosis.   5. The aortic valve is normal in structure. Aortic valve regurgitation is  not visualized. No aortic stenosis is present.   6. The inferior vena cava is normal in size with greater than 50%  respiratory variability, suggesting right atrial pressure of 3 mmHg.   Laboratory Data:  High Sensitivity Troponin:   Recent Labs  Lab 12/07/23 1523 12/07/23 1725  TROPONINIHS 30* 27*     Chemistry Recent Labs  Lab 12/07/23 1240 12/08/23 0458  NA 135 137  K 4.3 4.5  CL 104 105  CO2 21* 23  GLUCOSE 115* 139*  BUN 72* 68*  CREATININE 4.59* 3.92*  CALCIUM 8.7* 8.2*  MG 2.2  --   GFRNONAA 10* 12*  ANIONGAP 10 9    Recent Labs  Lab 12/07/23 1240  PROT 6.6  ALBUMIN 4.0  AST 15  ALT 13  ALKPHOS 56  BILITOT 0.6   Lipids No results for input(s): "CHOL", "TRIG", "HDL", "LABVLDL", "LDLCALC", "CHOLHDL" in the last 168 hours.  Hematology Recent Labs  Lab 12/07/23 1240 12/07/23 1623 12/08/23 0458  WBC 14.1* 12.8* 9.0  RBC 4.96 4.52 4.50  HGB 15.1* 13.9 13.8  HCT 45.8 41.3 41.1  MCV 92.3 91.4 91.3  MCH 30.4 30.8 30.7  MCHC 33.0 33.7 33.6  RDW 14.0  14.1 13.9  PLT 27* 21* 21*   Thyroid  Recent Labs  Lab 12/07/23 1725  TSH 1.086    BNPNo results for input(s): "BNP", "PROBNP" in the last 168 hours.  DDimer No results for input(s): "DDIMER" in the last 168 hours.   Radiology/Studies:  CT HEAD WO CONTRAST ( ) Result Date: 12/07/2023 IMPRESSION: 1. No evidence of acute intracranial abnormality. 2. Severe chronic small vessel ischemic disease. Electronically Signed   By: Aundra Lee M.D.   On: 12/07/2023 18:02   Assessment and Plan:   Symptomatic bradycardia 2:1 heart block Atrial fibrillation - History of paroxysmal atrial fibrillation s/p ablation 07/2023 - Presented with 2 weeks of n/v/d with  associated weakness and fatigue. One episode of near syncope. HR noted to be in the 30s with 2:1 heart block.  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 INR on admission was 8.4 s/p vitamin K, now 1.1 - Warfarin held per pharmacy   HFimpEF - Appears euvolemic on exam, dose not appear to be in acute exacerbation - GDMT and diuretics held in the setting of AKI and bradycardia  For questions or updates, please contact [facility] Please consult [email or web address] for contact info under    Signed, Brodie Cannon, PA-C  12/08/2023 9:34 AM

## 2023-12-09 DIAGNOSIS — R001 Bradycardia, unspecified: Secondary | ICD-10-CM | POA: Diagnosis not present

## 2023-12-09 LAB — CBC WITH DIFFERENTIAL/PLATELET
Abs Immature Granulocytes: 0.32 10*3/uL — ABNORMAL HIGH (ref 0.00–0.07)
Basophils Absolute: 0 10*3/uL (ref 0.0–0.1)
Basophils Relative: 0 %
Eosinophils Absolute: 0 10*3/uL (ref 0.0–0.5)
Eosinophils Relative: 0 %
HCT: 38.6 % (ref 36.0–46.0)
Hemoglobin: 13.5 g/dL (ref 12.0–15.0)
Immature Granulocytes: 2 %
Lymphocytes Relative: 3 %
Lymphs Abs: 0.5 10*3/uL — ABNORMAL LOW (ref 0.7–4.0)
MCH: 30.9 pg (ref 26.0–34.0)
MCHC: 35 g/dL (ref 30.0–36.0)
MCV: 88.3 fL (ref 80.0–100.0)
Monocytes Absolute: 1.3 10*3/uL — ABNORMAL HIGH (ref 0.1–1.0)
Monocytes Relative: 8 %
Neutro Abs: 14.4 10*3/uL — ABNORMAL HIGH (ref 1.7–7.7)
Neutrophils Relative %: 87 %
Platelets: 24 10*3/uL — CL (ref 150–400)
RBC: 4.37 MIL/uL (ref 3.87–5.11)
RDW: 14 % (ref 11.5–15.5)
WBC: 16.5 10*3/uL — ABNORMAL HIGH (ref 4.0–10.5)
nRBC: 0 % (ref 0.0–0.2)

## 2023-12-09 LAB — BASIC METABOLIC PANEL WITH GFR
Anion gap: 7 (ref 5–15)
BUN: 61 mg/dL — ABNORMAL HIGH (ref 8–23)
CO2: 20 mmol/L — ABNORMAL LOW (ref 22–32)
Calcium: 7.6 mg/dL — ABNORMAL LOW (ref 8.9–10.3)
Chloride: 106 mmol/L (ref 98–111)
Creatinine, Ser: 3.35 mg/dL — ABNORMAL HIGH (ref 0.44–1.00)
GFR, Estimated: 14 mL/min — ABNORMAL LOW (ref >60)
Glucose, Bld: 161 mg/dL — ABNORMAL HIGH (ref 70–99)
Potassium: 4.5 mmol/L (ref 3.5–5.1)
Sodium: 133 mmol/L — ABNORMAL LOW (ref 135–145)

## 2023-12-09 LAB — PROTIME-INR
INR: 0.9 (ref 0.8–1.2)
Prothrombin Time: 12.8 s (ref 11.4–15.2)

## 2023-12-09 LAB — HAPTOGLOBIN: Haptoglobin: 82 mg/dL (ref 37–355)

## 2023-12-09 NOTE — Progress Notes (Signed)
 Progress Note   Patient: Dawn Sherman MVH:[id number] DOB: 09/10/1955 DOA: 12/07/2023     2 DOS: the patient was seen and examined on 12/09/2023   Brief hospital course: Dawn Sherman is a 68 y.o. female with medical history significant of Chronic systolic heart failure, atrial fibrillation on amiodarone and Coumadin, hypothyroidism, ITP, iron deficiency anemia, hypertension, hyperlipidemia, COPD, chronic kidney disease stage IV who presents to the ED due to low heart rate as well as acute on chronic renal failure.  Cardiologist as well as hematologist on board     Assessment and Plan: Symptomatic bradycardia Patient is presenting with new onset dizziness for the last 2-3 days prior to presentation.  Possible noted to be in the 30s on arrival EP team on board Will consider atropine trial if patient becomes dizzy or hypotensive Avoid AV nodal blocking agent Plan of care discussed with cardiology team Pacer pads at bedside   AKI (acute kidney injury) (HCC) In the setting of GI illness leading to profound dehydration. Renal function improving Status post IV fluid Monitor renal function Avoid nephrotoxic agents   Acute gastroenteritis Patient presenting with 10 days of nausea, vomiting and diarrhea, concerning for viral etiology.  She also has a history of Cryptosporidium infection. 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   Continue Decadron Oncologist consulted we appreciate input   Supratherapeutic INR-improved Patient is on Coumadin for atrial fibrillation, now presenting with INR of 8.2 on presentation likely in the setting of acute illness INR improved Holding warfarin at this time given significant thrombocytopenia   HFrEF (heart failure with reduced ejection fraction)  (HCC) History of HFrEF with recovered EF in the setting of atrial fibrillation.  Most recent echocardiogram demonstrated recovered EF up to 55-60%.  She appears hypovolemic at this time. Continue to hold home diuretics in the setting of AKI Hold home GDMT in the setting of bradycardia and high risk for hypotension Monitor input and output as well as daily weight   Paroxysmal atrial fibrillation (HCC) History of atrial fibrillation s/p ablation, currently on Coumadin, amiodarone and carvedilol.  She is planned to undergo watchman's in 2 weeks.  Currently in sinus rhythm with bradycardia.   - Hold Coumadin in the setting of supratherapeutic INR - Hold home amiodarone and carvedilol in the setting of bradycardia   Chronic obstructive pulmonary disease (HCC) No wheezing, shortness of breath or cough. Continue DuoNebs as needed Continue home bronchodilators   Advance Care Planning:   Code Status: Full Code    Consults: Cardiology, oncology   Family Communication: Patient's husband updated at bedside   Subjective:  Patient seen and examined at bedside this morning Bradycardia improving Denies nausea vomiting abdominal pain chest pain or cough     Physical Exam: General: Elderly female laying in bed in no acute distress Eyes:     Extraocular Movements: Extraocular movements intact.     Pupils: Pupils are equal, round, and reactive to light.  Cardiovascular:     Rate and Rhythm: Regular rhythm. Bradycardia present.  Pulmonary:     Effort: Pulmonary effort is normal. No respiratory distress.     Breath sounds: Normal breath sounds. No wheezing, rhonchi or rales.  Abdominal: General: Bowel sounds are normal. There is no distension. guarding.  Musculoskeletal:     Cervical back: Neck  supple.  Skin:    General: Skin is warm and dry. present.     Comments: No petechiae or purpura  Neurological:     General: No focal deficit present.     Mental Status: She is alert and oriented to  person, place, and time. Mental status is at baseline.  Psychiatric:      Data Reviewed:  Vitals:   12/09/23 0422 12/09/23 0536 12/09/23 0856 12/09/23 1233  BP: 131/60  (!) 153/55 (!) 159/53  Pulse: (!) 50  (!) 44 73  Resp: 16     Temp: 97.6 F (36.4 C)  97.7 F (36.5 C) 98.7 F (37.1 C)  TempSrc: Oral   Oral  SpO2: 98%  92% 98%  Weight:  56.7 kg    Height:          Latest Ref Rng & Units 12/09/2023   10:31 AM 12/08/2023    4:58 AM 12/07/2023    4:23 PM  CBC  WBC 4.0 - 10.5 K/uL 16.5  9.0  12.8   Hemoglobin 12.0 - 15.0 g/dL 16.1  09.6  04.5   Hematocrit 36.0 - 46.0 % 38.6  41.1  41.3   Platelets 150 - 400 K/uL 24  21  21         Latest Ref Rng & Units 12/09/2023   10:31 AM 12/08/2023    4:58 AM 12/07/2023   12:40 PM  BMP  Glucose 70 - 99 mg/dL 409  811  914   BUN 8 - 23 mg/dL 61  68  72   Creatinine 0.44 - 1.00 mg/dL 7.82  9.56  2.13   Sodium 135 - 145 mmol/L 133  137  135   Potassium 3.5 - 5.1 mmol/L 4.5  4.5  4.3   Chloride 98 - 111 mmol/L 106  105  104   CO2 22 - 32 mmol/L 20  23  21    Calcium 8.9 - 10.3 mg/dL 7.6  8.2  8.7      Author: Ezzard Holms, MD 12/09/2023 3:22 PM  For on call review [email or web address].

## 2023-12-09 NOTE — Progress Notes (Addendum)
 Patient Name: Furman Jobs Date of Encounter: 12/09/2023  Primary Cardiologist: Timothy Gollan, MD Electrophysiologist: Boyce Byes, MD  Interval Summary   Dalene Duck. She has not had any further vomiting or diarrhea, is having intermittent nausea but this seems to be improving. S Some dizziness with walking to bathroom.  No further syncope. Denies chest pain, chest pressure, palpitations, shortness of breath, or any new concerns.  Vital Signs    Vitals:   12/09/23 0422 12/09/23 0536 12/09/23 0856 12/09/23 1233  BP: 131/60  (!) 153/55 (!) 159/53  Pulse: (!) 50  (!) 44 73  Resp: 16     Temp: 97.6 F (36.4 C)  97.7 F (36.5 C) 98.7 F (37.1 C)  TempSrc: Oral   Oral  SpO2: 98%  92% 98%  Weight:  56.7 kg    Height:        Intake/Output Summary (Last 24 hours) at 12/09/2023 1255 Last data filed at 12/09/2023 0337 Gross per 24 hour  Intake 403.59 ml  Output --  Net 403.59 ml   Filed Weights   12/07/23 1234 12/09/23 0536  Weight: 55.1 kg 56.7 kg    Physical Exam    GEN- The patient is well appearing, alert and oriented x 3 today.   Lungs- Clear to ausculation bilaterally, normal work of breathing Cardiac- Regular rate and rhythm, no murmurs, rubs or gallops GI- soft, NT, ND, + BS Extremities- no clubbing or cyanosis. No edema  Telemetry    2:1 HB overnight, rates 30-40s, then wenkebach, now with periods of sinus in 60s (personally reviewed)  Hospital Course    ANTHONELLA KLAUSNER is a 68 y.o. female with PMH of  parox AFib, HFimpEF, COPD, MR, HTN, CKD - stage 4, hypothyroid, ITP admitted for N/V/D x 10 days, syncope, found to be in 2:1 HB  Assessment & Plan    #) 2:1 HB #) syncope AV conduction appears to be improving with holding coreg and amiodarone.  Will continue to monitor rhythm while coreg continues to wash out, prolonged d/t patient's CKD   #) parox AFib S/p ablation 07/2023 by Dr. Marven Slimmer Maintaining sinus at this time Amiodarone stopped as  above Off warfarin d/t ITP and supratherapeutic INR   #) Acute on CKD #) n/v/d Both appear improving Mgmt per primary team      For questions or updates, please contact [facility] Please consult [email or web address] for contact info under Cardiology/STEMI.  Signed, Suzann Riddle, NP  12/09/2023, 12:55 PM   I have seen, examined the patient, and reviewed the above assessment and plan.    Interval: No acute overnight events.  Patient continued to have episodes of AV Wenckebach and 2-1 block during nocturnal hours while sleeping.  He had prolonged periods today of one-to-one conduction with first-degree AV block at normal rates.  She reports doing relatively well.  She has no new or acute complaints today.  GEN: No acute distress.   Cardiac: Normal rate, regular rhythm. Psych: Normal affect   Assessment: ARLETHA MARSCHKE is a 68 y.o. female with a history of parox AFib, HFimpEF, COPD, MR, HTN, CKD - stage 4, hypothyroid, ITP who is being seen today for the evaluation of 2:1 AV block.  Bradycardia and 2 1 AV block has occurred in the setting of significant AKI, likely from an acute GI illness, while on amiodarone and carvedilol.  She is greater than 3 months out from an atrial fibrillation ablation.  She has not had any recurrence of atrial fibrillation.  It is reasonable to go ahead and stop her amiodarone, although this will take weeks to washout fully.  We are also holding her carvedilol and this too may take some time to washout in the setting of her significant AKI.  At this point, I feel it is reasonable to see if medications can wash out of her system and she can avoid implantation of permanent pacemaker.  Circumstances of her questionable syncopal event are uncertain: Could be secondary to orthostasis from dehydration in the setting of GI illness or vasovagal in setting of nausea, vomiting.    Amiodarone and carvedilol have been held.  She has been monitored on telemetry.  She has had  improvement in her conduction with holding these medications and likely some improvement in her renal function. Today with one-to-one conduction for long stretches with first-degree AV block.  I suspect that with the tincture of time, she will continue to recover. Thus far, no evidence of high-grade AV block.    Plan: - Continue to monitor on telemetry. - Continue holding amiodarone and carvedilol.  Hold all other AV nodal blocking agents and bradycardia inducing medications. - Continue warfarin. - No obvious indication for permanent pacemaker at this time.   Ardeen Kohler, MD 12/09/2023 10:06 PM

## 2023-12-09 NOTE — Plan of Care (Signed)

## 2023-12-09 NOTE — Consult Note (Signed)
 PHARMACY - ANTICOAGULATION CONSULT NOTE  Pharmacy Consult for warfarin Indication: atrial fibrillation  Allergies  Allergen Reactions   Sulfate Rash   Codeine Sulfate Nausea Only   Benicar [Olmesartan]     Talked with patient February 10, 2020, intolerance is unclear, tried several medications around that time and one of them gave her a rash but she is not clear which 1.   Amoxicillin Rash   Clindamycin/Lincomycin Rash   Entresto [Sacubitril-Valsartan] Other (See Comments)    hyperkalemia   Morphine And Codeine Rash   Penicillins Rash    Patient Measurements: Height: 5\' 5"  (165.1 cm) Weight: 56.7 kg (125 lb) IBW/kg (Calculated) : 57 HEPARIN DW (KG): 55.1  Vital Signs: Temp: 97.7 F (36.5 C) (04/15 0856) Temp Source: Oral (04/15 0422) BP: 153/55 (04/15 0856) Pulse Rate: 44 (04/15 0856)  Labs: Recent Labs    12/07/23 1240 12/07/23 1523 12/07/23 1623 12/07/23 1725 12/08/23 0458 12/09/23 0430  HGB 15.1*  --  13.9  --  13.8  --   HCT 45.8  --  41.3  --  41.1  --   PLT 27*  --  21*  --  21*  --   APTT  --   --   --   --  27  --   LABPROT 69.7*  --   --   --  14.3 12.8  INR 8.4*  --   --   --  1.1 0.9  CREATININE 4.59*  --   --   --  3.92*  --   TROPONINIHS  --  30*  --  27*  --   --     Estimated Creatinine Clearance: 12.3 mL/min (A) (by C-G formula based on SCr of 3.92 mg/dL (H)).  Medical History: Past Medical History:  Diagnosis Date   B12 deficiency    Cardiomyopathy (HCC)    a. 09/2022 Echo: EF 35-40%; b. 09/2022 MV: apical defect w/ mild peri-infarct ischemia. EF 43%.   Chronic HFrEF (heart failure with reduced ejection fraction) (HCC)    a. 01/2020 Echo: EF 30-35%; b. 07/2020 Echo: EF 50-55%; c. 06/2022 Echo: EF 50-55%; d. 09/2022 Echo: EF 35-40%, globh HK, mod reduced RV fxn, sev BAE, mod MR.   CKD (chronic kidney disease), stage IV (HCC)    COPD (chronic obstructive pulmonary disease) (HCC)    Depression    Endometriosis    Hypertension    Mixed  hyperlipidemia    Moderate mitral regurgitation    Normocytic anemia    PAF (paroxysmal atrial fibrillation) (HCC)    a. CHA2DS2VASc = 4-->eliquis 2.5 bid.   Paroxymsal Atrial flutter (HCC)    Tobacco abuse     Medications:  Warfarin PTA dose; 3.75mg  po daily; TTR 33.7% Last dose 4/12 per med rec  Assessment: 68yo female admitted to ED with symptomatic bradycardia. PMH includes paroxysmal A-fib s/p ablation, COPD, and CKD. Pharmacy consulted to manage warfarin while inpatient. Pt has hx of ITP. Plt are below < 50 K.   DDI: amiodarone   Trial with Eliquis and Xarelto in the past but unable to afford medication.   Date INR Warfarin dose Comments  4/13 8.4 --- Admitted, Vitamin K 2.5mg  IV given. No bleeding but patient unable to tolerate PO  4/14 1.1 --- PLT remain low at 21, discussed with MD and will not resume warfarin or initiate anticoagulation bridging at this time    Goal of Therapy:  INR 2-3   Plan: Per Heme/Onc, her warfarin will need to  be held until her platelet counts are more than 50. Pharmacy will monitor plts and restart warfarin has appropriate. Monitor CBC daily and INR daily.   Harlie Lieu, PharmD, BCPS Clinical Pharmacist 12/09/2023 10:23 AM

## 2023-12-10 ENCOUNTER — Other Ambulatory Visit: Payer: Self-pay | Admitting: Cardiology

## 2023-12-10 ENCOUNTER — Telehealth: Payer: Self-pay

## 2023-12-10 ENCOUNTER — Ambulatory Visit

## 2023-12-10 DIAGNOSIS — R001 Bradycardia, unspecified: Secondary | ICD-10-CM | POA: Diagnosis not present

## 2023-12-10 LAB — BASIC METABOLIC PANEL WITH GFR
Anion gap: 5 (ref 5–15)
BUN: 58 mg/dL — ABNORMAL HIGH (ref 8–23)
CO2: 23 mmol/L (ref 22–32)
Calcium: 8.3 mg/dL — ABNORMAL LOW (ref 8.9–10.3)
Chloride: 111 mmol/L (ref 98–111)
Creatinine, Ser: 3.07 mg/dL — ABNORMAL HIGH (ref 0.44–1.00)
GFR, Estimated: 16 mL/min — ABNORMAL LOW (ref >60)
Glucose, Bld: 113 mg/dL — ABNORMAL HIGH (ref 70–99)
Potassium: 5.2 mmol/L — ABNORMAL HIGH (ref 3.5–5.1)
Sodium: 139 mmol/L (ref 135–145)

## 2023-12-10 LAB — PROTIME-INR
INR: 1 (ref 0.8–1.2)
Prothrombin Time: 13.8 s (ref 11.4–15.2)

## 2023-12-10 LAB — GASTROINTESTINAL PANEL BY PCR, STOOL (REPLACES STOOL CULTURE)

## 2023-12-10 LAB — CBC WITH DIFFERENTIAL/PLATELET
Abs Immature Granulocytes: 0.14 10*3/uL — ABNORMAL HIGH (ref 0.00–0.07)
Basophils Absolute: 0 10*3/uL (ref 0.0–0.1)
Basophils Relative: 0 %
Eosinophils Absolute: 0 10*3/uL (ref 0.0–0.5)
Eosinophils Relative: 0 %
HCT: 42.8 % (ref 36.0–46.0)
Hemoglobin: 14.5 g/dL (ref 12.0–15.0)
Immature Granulocytes: 1 %
Lymphocytes Relative: 3 %
Lymphs Abs: 0.5 10*3/uL — ABNORMAL LOW (ref 0.7–4.0)
MCH: 30.7 pg (ref 26.0–34.0)
MCHC: 33.9 g/dL (ref 30.0–36.0)
MCV: 90.5 fL (ref 80.0–100.0)
Monocytes Absolute: 0.7 10*3/uL (ref 0.1–1.0)
Monocytes Relative: 5 %
Neutro Abs: 12.8 10*3/uL — ABNORMAL HIGH (ref 1.7–7.7)
Neutrophils Relative %: 91 %
Platelets: 31 10*3/uL — ABNORMAL LOW (ref 150–400)
RBC: 4.73 MIL/uL (ref 3.87–5.11)
RDW: 14.2 % (ref 11.5–15.5)
WBC: 14.1 10*3/uL — ABNORMAL HIGH (ref 4.0–10.5)
nRBC: 0 % (ref 0.0–0.2)

## 2023-12-10 MED ORDER — SODIUM ZIRCONIUM CYCLOSILICATE 10 G PO PACK
10.0000 g | PACK | Freq: Two times a day (BID) | ORAL | Status: AC
  Administered 2023-12-10 (×2): 10 g via ORAL
  Filled 2023-12-10 (×2): qty 1

## 2023-12-10 NOTE — Progress Notes (Signed)
 Progress Note   Patient: Dawn Sherman ZOX:[id number] DOB: Sep 19, 1955 DOA: 12/07/2023     3 DOS: the patient was seen and examined on 12/10/2023    Brief hospital course: Dawn Sherman is a 68 y.o. female with medical history significant of Chronic systolic heart failure, atrial fibrillation on amiodarone and Coumadin, hypothyroidism, ITP, iron deficiency anemia, hypertension, hyperlipidemia, COPD, chronic kidney disease stage IV who presents to the ED due to low heart rate as well as acute on chronic renal failure.  Cardiologist as well as hematologist on board     Assessment and Plan: Symptomatic bradycardia Patient is presenting with new onset dizziness for the last 2-3 days prior to presentation.  Possible noted to be in the 30s on arrival EP team on board Will consider atropine trial if patient becomes dizzy or hypotensive Avoid AV nodal blocking agent Plan of care discussed with cardiology team Pacer pads at bedside   AKI (acute kidney injury) (HCC) In the setting of GI illness leading to profound dehydration. Renal function improving Status post IV fluid Monitor renal function Avoid nephrotoxic agents   Acute gastroenteritis Patient presenting with 10 days of nausea, vomiting and diarrhea, concerning for viral etiology.  She also has a history of Cryptosporidium infection. 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   Continue Decadron Case have been discussed with oncologist   Supratherapeutic INR-improved Patient is on Coumadin for atrial fibrillation, now presenting with INR of 8.2 on presentation likely in the setting of acute illness INR improved Holding warfarin at this time given significant thrombocytopenia I have discussed with hematologist and onto platelet  level greater than 50,000 we will continue to hold warfarin   HFrEF (heart failure with reduced ejection fraction) (HCC) History of HFrEF with recovered EF in the setting of atrial fibrillation.  Most recent echocardiogram demonstrated recovered EF up to 55-60%.  She appears hypovolemic at this time. Continue to hold home diuretics in the setting of AKI Hold home GDMT in the setting of bradycardia and high risk for hypotension Monitor input and output as well as daily weight   Paroxysmal atrial fibrillation (HCC) History of atrial fibrillation s/p ablation, currently on Coumadin, amiodarone and carvedilol.  She is planned to undergo watchman's in 2 weeks.  Currently in sinus rhythm with bradycardia. Cardiology plan to get patient a monitor at discharge - Hold Coumadin in the setting of supratherapeutic INR - Hold home amiodarone and carvedilol in the setting of bradycardia   Chronic obstructive pulmonary disease (HCC) No wheezing, shortness of breath or cough. Continue DuoNebs as needed Continue home bronchodilators   Advance Care Planning:   Code Status: Full Code    Consults: Cardiology, oncology   Family Communication: Patient's husband updated at bedside   Subjective:  Patient seen and examined at bedside this morning Bradycardia improved I have discussed with the EP cardiology team and they will follow-up patient after discharge Platelets levels slowly improving Potassium level elevated requiring Lokelma today   Physical Exam: General: Elderly female laying in bed in no acute distress Eyes:     Extraocular Movements: Extraocular movements intact.     Pupils: Pupils are equal, round, and reactive to light.  Cardiovascular: Bradycardia improved Pulmonary:     Effort: Pulmonary effort is normal. No respiratory distress.  Breath sounds: Normal breath sounds. No wheezing, rhonchi or rales.  Abdominal: General: Bowel sounds are normal. There is no distension. guarding.   Musculoskeletal:     Cervical back: Neck supple.  Skin:    General: Skin is warm and dry. present.     Comments: No petechiae or purpura  Neurological:     General: No focal deficit present.     Mental Status: She is alert and oriented to person, place, and time. Mental status is at baseline.  Psychiatric:       Data Reviewed:     Vitals:   12/09/23 1929 12/09/23 2354 12/10/23 0327 12/10/23 1310  BP: (!) 149/57 (!) 168/75 (!) 172/71 (!) 159/73  Pulse: 72 63 66 75  Resp: 16 17 16    Temp: 98 F (36.7 C) 98 F (36.7 C) 98 F (36.7 C) 98 F (36.7 C)  TempSrc:    Oral  SpO2: 99% 100% 98% 100%  Weight:      Height:          Latest Ref Rng & Units 12/10/2023    3:43 AM 12/09/2023   10:31 AM 12/08/2023    4:58 AM  CBC  WBC 4.0 - 10.5 K/uL 14.1  16.5  9.0   Hemoglobin 12.0 - 15.0 g/dL 16.1  09.6  04.5   Hematocrit 36.0 - 46.0 % 42.8  38.6  41.1   Platelets 150 - 400 K/uL 31  24  21        Latest Ref Rng & Units 12/10/2023    3:43 AM 12/09/2023   10:31 AM 12/08/2023    4:58 AM  BMP  Glucose 70 - 99 mg/dL 409  811  914   BUN 8 - 23 mg/dL 58  61  68   Creatinine 0.44 - 1.00 mg/dL 7.82  9.56  2.13   Sodium 135 - 145 mmol/L 139  133  137   Potassium 3.5 - 5.1 mmol/L 5.2  4.5  4.5   Chloride 98 - 111 mmol/L 111  106  105   CO2 22 - 32 mmol/L 23  20  23    Calcium 8.9 - 10.3 mg/dL 8.3  7.6  8.2      Author: Ezzard Holms, MD 12/10/2023 3:56 PM  For on call review [email or web address].

## 2023-12-10 NOTE — Plan of Care (Signed)

## 2023-12-10 NOTE — Progress Notes (Signed)
 EP brief note --  Tele reviewed, significantly more time in 1:1 AV conduction with rates in the 60-70s.  Overnight, brief periods of 2:1 HB.  Patient feels better, still has some slight dizziness with ambulating to bathroom.   EP will sign off at this time, but remains available.  If patient continues to have periods of HB, would recommend a monitor at discharge.     Deira Shimer, NP Electrophysiology 12/10/23 11:31 AM

## 2023-12-10 NOTE — Telephone Encounter (Signed)
 The patient is currently hospitalized for syncope and thrombocytopenia with platelet count 30. Cancelled Watchman procedure scheduled 4/24. Will follow in the background and reschedule if she'd like once recovered. She was grateful for call and agreed with plan.

## 2023-12-10 NOTE — Care Management Important Message (Signed)
 Important Message  Patient Details  Name: Dawn Sherman MRN: [id number] Date of Birth: 04-30-1956   Important Message Given:  Yes - Medicare IM     Anise Kerns 12/10/2023, 11:26 AM

## 2023-12-11 ENCOUNTER — Inpatient Hospital Stay
Admit: 2023-12-11 | Discharge: 2023-12-11 | Disposition: A | Discharge status: Home / Self Care | Attending: Nurse Practitioner | Admitting: Nurse Practitioner

## 2023-12-11 DIAGNOSIS — R001 Bradycardia, unspecified: Secondary | ICD-10-CM

## 2023-12-11 LAB — CBC WITH DIFFERENTIAL/PLATELET
Abs Immature Granulocytes: 0.14 10*3/uL — ABNORMAL HIGH (ref 0.00–0.07)
Basophils Absolute: 0 10*3/uL (ref 0.0–0.1)
Basophils Relative: 0 %
Eosinophils Absolute: 0 10*3/uL (ref 0.0–0.5)
Eosinophils Relative: 0 %
HCT: 38 % (ref 36.0–46.0)
Hemoglobin: 13.2 g/dL (ref 12.0–15.0)
Immature Granulocytes: 1 %
Lymphocytes Relative: 3 %
Lymphs Abs: 0.4 10*3/uL — ABNORMAL LOW (ref 0.7–4.0)
MCH: 30.8 pg (ref 26.0–34.0)
MCHC: 34.7 g/dL (ref 30.0–36.0)
MCV: 88.8 fL (ref 80.0–100.0)
Monocytes Absolute: 0.9 10*3/uL (ref 0.1–1.0)
Monocytes Relative: 6 %
Neutro Abs: 13.1 10*3/uL — ABNORMAL HIGH (ref 1.7–7.7)
Neutrophils Relative %: 90 %
Platelets: 46 10*3/uL — ABNORMAL LOW (ref 150–400)
RBC: 4.28 MIL/uL (ref 3.87–5.11)
RDW: 14.5 % (ref 11.5–15.5)
WBC: 14.6 10*3/uL — ABNORMAL HIGH (ref 4.0–10.5)
nRBC: 0 % (ref 0.0–0.2)

## 2023-12-11 LAB — BASIC METABOLIC PANEL WITH GFR
Anion gap: 6 (ref 5–15)
BUN: 60 mg/dL — ABNORMAL HIGH (ref 8–23)
CO2: 21 mmol/L — ABNORMAL LOW (ref 22–32)
Calcium: 8.2 mg/dL — ABNORMAL LOW (ref 8.9–10.3)
Chloride: 108 mmol/L (ref 98–111)
Creatinine, Ser: 2.86 mg/dL — ABNORMAL HIGH (ref 0.44–1.00)
GFR, Estimated: 17 mL/min — ABNORMAL LOW (ref >60)
Glucose, Bld: 122 mg/dL — ABNORMAL HIGH (ref 70–99)
Potassium: 4.5 mmol/L (ref 3.5–5.1)
Sodium: 135 mmol/L (ref 135–145)

## 2023-12-11 LAB — PROTIME-INR
INR: 1.1 (ref 0.8–1.2)
Prothrombin Time: 14.3 s (ref 11.4–15.2)

## 2023-12-11 MED ORDER — ISOSORBIDE MONONITRATE ER 30 MG PO TB24
30.0000 mg | ORAL_TABLET | Freq: Every day | ORAL | Status: DC
Start: 2023-12-11 — End: 2023-12-11

## 2023-12-11 MED ORDER — HYDRALAZINE HCL 25 MG PO TABS: 25.0000 mg | ORAL_TABLET | Freq: Three times a day (TID) | ORAL | Status: DC

## 2023-12-11 NOTE — Discharge Summary (Signed)
 Physician Discharge Summary   Patient: Dawn Sherman MRN: [id number] DOB: 10-11-1955  Admit date:     12/07/2023  Discharge date: 12/11/23  Discharge Physician: Loyce Dys   PCP: Dale [place], MD   Recommendations at discharge:  Follow-up with EP cardiology  Discharge Diagnoses:  Symptomatic bradycardia-improved AKI (acute kidney injury) (HCC) Acute gastroenteritis Idiopathic thrombocytopenic purpura (ITP) (HCC) Supratherapeutic INR-improved HFrEF (heart failure with reduced ejection fraction) (HCC) Paroxysmal atrial fibrillation (HCC) Chronic obstructive pulmonary disease [facility])  Hospital Course: Dawn Sherman is a 68 y.o. female with medical history significant of Chronic systolic heart failure, atrial fibrillation on amiodarone and Coumadin, hypothyroidism, ITP, iron deficiency anemia, hypertension, hyperlipidemia, COPD, chronic kidney disease stage IV who presents to the ED due to low heart rate as well as acute on chronic renal failure.  Cardiologist as well as hematologist were consulted.  Pulse have improved to normal range after coming off medications.  Patient's platelet level have improved after Decadron administration.  I discussed with hematologist who has agreed for Decadron to be discontinued as well as warfarin until follow-up with her next week    Consultants: Cardiology, hematology Procedures performed: None Disposition: Home Diet recommendation:  Cardiac diet DISCHARGE MEDICATION: Allergies as of 12/11/2023       Reactions   Sulfate Rash   Codeine Sulfate Nausea Only   Benicar [olmesartan]    Talked with patient February 10, 2020, intolerance is unclear, tried several medications around that time and one of them gave her a rash but she is not clear which 1.   Amoxicillin Rash   Clindamycin/lincomycin Rash   Entresto [sacubitril-valsartan] Other (See Comments)   hyperkalemia   Morphine And Codeine Rash   Penicillins Rash        Medication List      STOP taking these medications    amiodarone 200 MG tablet Commonly known as: PACERONE   carvedilol 12.5 MG tablet Commonly known as: COREG   warfarin 2.5 MG tablet Commonly known as: Jantoven       TAKE these medications    acetaminophen 325 MG tablet Commonly known as: TYLENOL Take 650 mg by mouth every 6 (six) hours as needed (pain.).   atorvastatin 40 MG tablet Commonly known as: LIPITOR Take 1 tablet (40 mg total) by mouth daily.   buPROPion 150 MG 24 hr tablet Commonly known as: WELLBUTRIN XL TAKE ONE TABLET BY MOUTH EVERY DAY   calcitRIOL 0.25 MCG capsule Commonly known as: ROCALTROL Take 0.25 mcg by mouth daily.   calcium carbonate 1250 (500 Ca) MG tablet Commonly known as: OS-CAL - dosed in mg of elemental calcium Take 1 tablet (500 mg of elemental calcium total) by mouth 3 (three) times daily with meals.   Farxiga 10 MG Tabs tablet Generic drug: dapagliflozin propanediol Take 10 mg by mouth daily.   hydrALAZINE 25 MG tablet Commonly known as: APRESOLINE Take 1 tablet (25 mg total) by mouth 3 (three) times daily.   isosorbide mononitrate 30 MG 24 hr tablet Commonly known as: IMDUR Take 1 tablet (30 mg total) by mouth daily.   levothyroxine 75 MCG tablet Commonly known as: SYNTHROID TAKE ONE TABLET BY MOUTH EVERY DAY (STOP 137 MCG DOSE)   loratadine 10 MG tablet Commonly known as: CLARITIN Take 10 mg by mouth daily.   omeprazole 20 MG capsule Commonly known as: PRILOSEC Take 1 capsule (20 mg total) by mouth daily.   Tiotropium Bromide-Olodaterol 2.5-2.5 MCG/ACT Aers Inhale 2 puffs into the lungs daily.  torsemide 20 MG tablet Commonly known as: DEMADEX TAKE 60 MG ALTERNATING WITH 40 MG EVERY OTHER DAY   venlafaxine XR 150 MG 24 hr capsule Commonly known as: EFFEXOR-XR TAKE ONE CAPSULE BY MOUTH EVERY DAY        Discharge Exam: Filed Weights   12/07/23 1234 12/09/23 0536 12/11/23 0500  Weight: 55.1 kg 56.7 kg 57.8 kg   General:  Elderly female laying in bed in no acute distress Cardiovascular: Bradycardia improved Pulmonary:     Effort: Pulmonary effort is normal. No respiratory distress.  Abdominal: General: Bowel sounds are normal. There is no distension. guarding.  Musculoskeletal:     Cervical back: Neck supple.  Skin:    General: Skin is warm and dry. present.  Neurological:     General: No focal deficit present.     Mental Status: She is alert and oriented to person, place, and time. Mental status is at baseline.   Condition at discharge: good  The results of significant diagnostics from this hospitalization (including imaging, microbiology, ancillary and laboratory) are listed below for reference.   Imaging Studies: CT HEAD WO CONTRAST ( ) Result Date: 12/07/2023 CLINICAL DATA:  Syncope/presyncope, cerebrovascular cause suspected. Recent fall. EXAM: CT HEAD WITHOUT CONTRAST TECHNIQUE: Contiguous axial images were obtained from the base of the skull through the vertex without intravenous contrast. RADIATION DOSE REDUCTION: This exam was performed according to the departmental dose-optimization program which includes automated exposure control, adjustment of the mA and/or kV according to patient size and/or use of iterative reconstruction technique. COMPARISON:  Head CT 12/16/2021 and MRI 03/08/2022 FINDINGS: Brain: There is no evidence of an acute infarct, intracranial hemorrhage, mass, midline shift, or extra-axial fluid collection. Cerebral volume is within normal limits for age with normal size of the ventricles. Confluent hypodensities in the cerebral white matter and pons may have progressed and are nonspecific but compatible with severe chronic small vessel ischemic disease. Vascular: Calcified atherosclerosis at the skull base. No hyperdense vessel. Skull: No acute fracture or suspicious lesion. Sinuses/Orbits: Visualized paranasal sinuses and mastoid air cells are clear. Unremarkable orbits. Other: None.  IMPRESSION: 1. No evidence of acute intracranial abnormality. 2. Severe chronic small vessel ischemic disease. Electronically Signed   By: Aundra Lee M.D.   On: 12/07/2023 18:02   ECHOCARDIOGRAM COMPLETE Result Date: 11/27/2023    ECHOCARDIOGRAM REPORT   Patient Name:   FLORENDA WATT Date of Exam: 11/27/2023 Medical Rec #:  [id number]     Height:       65.0 in Accession #:    [id number]    Weight:       121.4 lb Date of Birth:  29-Jan-1956    BSA:          1.600 m Patient Age:    67 years      BP:           188/100 mmHg Patient Gender: F             HR:           89 bpm. Exam Location:  [facility] Procedure: 2D Echo, 3D Echo, Cardiac Doppler, Color Doppler and Strain Analysis            (Both Spectral and Color Flow Doppler were utilized during            procedure). Indications:     Chronic systolic heart failure I50.22                  Congestive  heart failure I50.9  History:         Patient has prior history of Echocardiogram examinations, most                  recent 10/16/2023. Cardiomyopathy; Risk Factors:Hypertension.                  Moderate mitral regurgitation, Paroxysmal Afib.  Sonographer:     Cristela Blue Referring Phys:  [id number] Laurey Morale Diagnosing Phys: Lorine Bears MD  Sonographer Comments: Global longitudinal strain was attempted. IMPRESSIONS  1. Left ventricular ejection fraction, by estimation, is 55 to 60%. The left ventricle has normal function. The left ventricle has no regional wall motion abnormalities. There is moderate left ventricular hypertrophy. Left ventricular diastolic parameters were normal.  2. Right ventricular systolic function is normal. The right ventricular size is normal. Tricuspid regurgitation signal is inadequate for assessing PA pressure.  3. Left atrial size was mildly dilated.  4. The mitral valve is normal in structure. Mild mitral valve regurgitation. No evidence of mitral stenosis.  5. The aortic valve is normal in structure. Aortic valve regurgitation is not  visualized. No aortic stenosis is present.  6. The inferior vena cava is normal in size with greater than 50% respiratory variability, suggesting right atrial pressure of 3 mmHg. Comparison(s): A prior study was performed on 11/13/2012. Changes from prior study are noted. The left ventricular function has improved. FINDINGS  Left Ventricle: Left ventricular ejection fraction, by estimation, is 55 to 60%. The left ventricle has normal function. The left ventricle has no regional wall motion abnormalities. Global longitudinal strain performed but not reported based on interpreter judgement due to suboptimal tracking. 3D ejection fraction reviewed and evaluated as part of the interpretation. Alternate measurement of EF is felt to be most reflective of LV function. The left ventricular internal cavity size was normal in  size. There is moderate left ventricular hypertrophy. Left ventricular diastolic parameters were normal. Right Ventricle: The right ventricular size is normal. No increase in right ventricular wall thickness. Right ventricular systolic function is normal. Tricuspid regurgitation signal is inadequate for assessing PA pressure. Left Atrium: Left atrial size was mildly dilated. Right Atrium: Right atrial size was normal in size. Pericardium: There is no evidence of pericardial effusion. Mitral Valve: The mitral valve is normal in structure. Mild mitral annular calcification. Mild mitral valve regurgitation. No evidence of mitral valve stenosis. MV peak gradient, 3.6 mmHg. The mean mitral valve gradient is 2.0 mmHg. Tricuspid Valve: The tricuspid valve is normal in structure. Tricuspid valve regurgitation is trivial. No evidence of tricuspid stenosis. Aortic Valve: The aortic valve is normal in structure. Aortic valve regurgitation is not visualized. No aortic stenosis is present. Aortic valve mean gradient measures 3.0 mmHg. Aortic valve peak gradient measures 5.2 mmHg. Aortic valve area, by VTI measures  2.25 cm. Pulmonic Valve: The pulmonic valve was normal in structure. Pulmonic valve regurgitation is not visualized. No evidence of pulmonic stenosis. Aorta: The aortic root is normal in size and structure. Venous: The inferior vena cava is normal in size with greater than 50% respiratory variability, suggesting right atrial pressure of 3 mmHg. IAS/Shunts: No atrial level shunt detected by color flow Doppler. Additional Comments: 3D was performed not requiring image post processing on an independent workstation and was indeterminate.  LEFT VENTRICLE PLAX 2D LVIDd:         4.00 cm   Diastology LVIDs:         2.60 cm  LV e' medial:    4.90 cm/s LV PW:         1.40 cm   LV E/e' medial:  13.7 LV IVS:        1.80 cm   LV e' lateral:   5.33 cm/s LVOT diam:     2.00 cm   LV E/e' lateral: 12.6 LV SV:         38 LV SV Index:   24 LVOT Area:     3.14 cm  RIGHT VENTRICLE RV Basal diam:  2.30 cm RV Mid diam:    1.80 cm RV S prime:     11.90 cm/s TAPSE (M-mode): 2.1 cm LEFT ATRIUM             Index        RIGHT ATRIUM          Index LA diam:        4.00 cm 2.50 cm/m   RA Area:     9.17 cm LA Vol (A2C):   63.4 ml 39.63 ml/m  RA Volume:   17.70 ml 11.06 ml/m LA Vol (A4C):   33.3 ml 20.81 ml/m LA Biplane Vol: 50.0 ml 31.25 ml/m  AORTIC VALVE AV Area (Vmax):    2.04 cm AV Area (Vmean):   1.97 cm AV Area (VTI):     2.25 cm AV Vmax:           114.00 cm/s AV Vmean:          69.700 cm/s AV VTI:            0.170 m AV Peak Grad:      5.2 mmHg AV Mean Grad:      3.0 mmHg LVOT Vmax:         74.20 cm/s LVOT Vmean:        43.700 cm/s LVOT VTI:          0.122 m LVOT/AV VTI ratio: 0.72  AORTA Ao Root diam: 3.00 cm MITRAL VALVE               TRICUSPID VALVE MV Area (PHT): 2.68 cm    TR Peak grad:   8.5 mmHg MV Area VTI:   1.45 cm    TR Vmax:        146.00 cm/s MV Peak grad:  3.6 mmHg MV Mean grad:  2.0 mmHg    SHUNTS MV Vmax:       0.95 m/s    Systemic VTI:  0.12 m MV Vmean:      61.8 cm/s   Systemic Diam: 2.00 cm MV Decel Time:  283 msec MV E velocity: 67.10 cm/s MV A velocity: 69.20 cm/s MV E/A ratio:  0.97 Antionette Kirks MD Electronically signed by Antionette Kirks MD Signature Date/Time: 11/27/2023/12:56:09 PM    Final     Microbiology: Results for orders placed or performed during the hospital encounter of 12/07/23  Gastrointestinal Panel by PCR , Stool     Status: None   Collection Time: 12/10/23  9:55 AM   Specimen: Stool  Result Value Ref Range Status   Campylobacter species NOT DETECTED NOT DETECTED Final   Plesimonas shigelloides NOT DETECTED NOT DETECTED Final   Salmonella species NOT DETECTED NOT DETECTED Final   Yersinia enterocolitica NOT DETECTED NOT DETECTED Final   Vibrio species NOT DETECTED NOT DETECTED Final   Vibrio cholerae NOT DETECTED NOT DETECTED Final   Enteroaggregative E coli (EAEC) NOT DETECTED NOT DETECTED Final   Enteropathogenic  E coli (EPEC) NOT DETECTED NOT DETECTED Final   Enterotoxigenic E coli (ETEC) NOT DETECTED NOT DETECTED Final   Shiga like toxin producing E coli (STEC) NOT DETECTED NOT DETECTED Final   Shigella/Enteroinvasive E coli (EIEC) NOT DETECTED NOT DETECTED Final   Cryptosporidium NOT DETECTED NOT DETECTED Final   Cyclospora cayetanensis NOT DETECTED NOT DETECTED Final   Entamoeba histolytica NOT DETECTED NOT DETECTED Final   Giardia lamblia NOT DETECTED NOT DETECTED Final   Adenovirus F40/41 NOT DETECTED NOT DETECTED Final   Astrovirus NOT DETECTED NOT DETECTED Final   Norovirus GI/GII NOT DETECTED NOT DETECTED Final   Rotavirus A NOT DETECTED NOT DETECTED Final   Sapovirus (I, II, IV, and V) NOT DETECTED NOT DETECTED Final    Comment: Performed at [facility] [address] [place], [place] [address]    Labs: CBC: Recent Labs  Lab 12/07/23 1240 12/07/23 1623 12/08/23 0458 12/09/23 1031 12/10/23 0343 12/11/23 0504  WBC 14.1* 12.8* 9.0 16.5* 14.1* 14.6*  NEUTROABS 11.8*  --   --  14.4* 12.8* 13.1*  HGB 15.1* 13.9 13.8 13.5 14.5 13.2  HCT  45.8 41.3 41.1 38.6 42.8 38.0  MCV 92.3 91.4 91.3 88.3 90.5 88.8  PLT 27* 21* 21* 24* 31* 46*   Basic Metabolic Panel: Recent Labs  Lab 12/07/23 1240 12/08/23 0458 12/09/23 1031 12/10/23 0343 12/11/23 0504  NA 135 137 133* 139 135  K 4.3 4.5 4.5 5.2* 4.5  CL 104 105 106 111 108  CO2 21* 23 20* 23 21*  GLUCOSE 115* 139* 161* 113* 122*  BUN 72* 68* 61* 58* 60*  CREATININE 4.59* 3.92* 3.35* 3.07* 2.86*  CALCIUM 8.7* 8.2* 7.6* 8.3* 8.2*  MG 2.2  --   --   --   --    Liver Function Tests: Recent Labs  Lab 12/07/23 1240  AST 15  ALT 13  ALKPHOS 56  BILITOT 0.6  PROT 6.6  ALBUMIN 4.0   CBG: No results for input(s): "GLUCAP" in the last 168 hours.  Discharge time spent:  35 minutes.  Signed: Ezzard Holms, MD [facility] 12/11/2023

## 2023-12-11 NOTE — Consult Note (Signed)
 PHARMACY - ANTICOAGULATION CONSULT NOTE  Pharmacy Consult for warfarin Indication: atrial fibrillation  Allergies  Allergen Reactions   Sulfate Rash   Codeine Sulfate Nausea Only   Benicar [Olmesartan]     Talked with patient February 10, 2020, intolerance is unclear, tried several medications around that time and one of them gave her a rash but she is not clear which 1.   Amoxicillin Rash   Clindamycin/Lincomycin Rash   Entresto [Sacubitril-Valsartan] Other (See Comments)    hyperkalemia   Morphine And Codeine Rash   Penicillins Rash    Patient Measurements: Height: 5\' 5"  (165.1 cm) Weight: 57.8 kg (127 lb 6.8 oz) IBW/kg (Calculated) : 57 HEPARIN DW (KG): 55.1  Vital Signs: Temp: 97.7 F (36.5 C) (04/17 0500) Temp Source: Oral (04/17 0500) BP: 174/76 (04/17 0500) Pulse Rate: 65 (04/17 0500)  Labs: Recent Labs    12/09/23 0430 12/09/23 1031 12/09/23 1031 12/10/23 0343 12/11/23 0504  HGB  --  13.5   < > 14.5 13.2  HCT  --  38.6  --  42.8 38.0  PLT  --  24*  --  31* 46*  LABPROT 12.8  --   --  13.8 14.3  INR 0.9  --   --  1.0 1.1  CREATININE  --  3.35*  --  3.07* 2.86*   < > = values in this interval not displayed.    Estimated Creatinine Clearance: 16.9 mL/min (A) (by C-G formula based on SCr of 2.86 mg/dL (H)).  Medical History: Past Medical History:  Diagnosis Date   B12 deficiency    Cardiomyopathy (HCC)    a. 09/2022 Echo: EF 35-40%; b. 09/2022 MV: apical defect w/ mild peri-infarct ischemia. EF 43%.   Chronic HFrEF (heart failure with reduced ejection fraction) (HCC)    a. 01/2020 Echo: EF 30-35%; b. 07/2020 Echo: EF 50-55%; c. 06/2022 Echo: EF 50-55%; d. 09/2022 Echo: EF 35-40%, globh HK, mod reduced RV fxn, sev BAE, mod MR.   CKD (chronic kidney disease), stage IV (HCC)    COPD (chronic obstructive pulmonary disease) (HCC)    Depression    Endometriosis    Hypertension    Mixed hyperlipidemia    Moderate mitral regurgitation    Normocytic anemia    PAF  (paroxysmal atrial fibrillation) (HCC)    a. CHA2DS2VASc = 4-->eliquis 2.5 bid.   Paroxymsal Atrial flutter (HCC)    Tobacco abuse     Medications:  Warfarin PTA dose; 3.75mg  po daily; TTR 33.7% Last dose 4/12 per med rec  Assessment: 68yo female admitted to ED with symptomatic bradycardia. PMH includes paroxysmal A-fib s/p ablation, COPD, and CKD. Pharmacy consulted to manage warfarin while inpatient. Pt has hx of ITP. Plt are below < 50 K.   DDI: amiodarone   Trial with Eliquis and Xarelto in the past but unable to afford medication.   Date INR Warfarin dose Comments  4/13 8.4 --- Admitted, Vitamin K 2.5mg  IV given. No bleeding but patient unable to tolerate PO  4/14-4/17 0.9- 1.1 --- PLT remain low but trending up, discussed with MD and will not resume warfarin or initiate anticoagulation bridging at this time    Goal of Therapy:  INR 2-3   Plan: Per Heme/Onc, her warfarin will need to be held until her platelet counts are more than 50. Pharmacy will monitor plts and restart warfarin has appropriate. Monitor CBC daily and INR daily.   Paschal Dopp, PharmD, BCPS Clinical Pharmacist 12/11/2023 7:59 AM

## 2023-12-11 NOTE — Plan of Care (Signed)

## 2023-12-12 ENCOUNTER — Telehealth: Payer: Self-pay | Admitting: *Deleted

## 2023-12-12 NOTE — Transitions of Care (Post Inpatient/ED Visit) (Signed)
 12/12/2023  Name: Dawn Sherman MRN: [id number] DOB: 1956/05/13  Today's TOC FU Call Status: Today's TOC FU Call Status:: Successful TOC FU Call Completed TOC FU Call Complete Date: 12/12/23 Patient's Name and Date of Birth confirmed.  Transition Care Management Follow-up Telephone Call Date of Discharge: 12/11/23 Discharge Facility: [facility]) Type of Discharge: Inpatient Admission Primary Inpatient Discharge Diagnosis:: Symptomatic bradycardia How have you been since you were released from the hospital?: Better Any questions or concerns?: No  Items Reviewed: Did you receive and understand the discharge instructions provided?: Yes Medications obtained,verified, and reconciled?: Yes (Medications Reviewed) Any new allergies since your discharge?: No Dietary orders reviewed?: No Do you have support at home?: Yes People in Home [RPT]: spouse Name of Support/Comfort Primary Source: Jimmy spouse  Medications Reviewed Today: Medications Reviewed Today     Reviewed by Eilene Grater, RN (Case Manager) on 12/12/23 at 1014  Med List Status: <None>   Medication Order Taking? Sig Documenting Provider Last Dose Status Informant  acetaminophen  (TYLENOL ) 325 MG tablet [id number] Yes Take 650 mg by mouth every 6 (six) hours as needed (pain.). [provider] Taking Active Self           Med Note Andy Bannister, Jenine Mix Dec 07, 2023  3:43 PM) prn  atorvastatin  (LIPITOR) 40 MG tablet [id number] Yes Take 1 tablet (40 mg total) by mouth daily. Dellar Fenton, MD Taking Active Self  buPROPion  (WELLBUTRIN  XL) 150 MG 24 hr tablet [id number] Yes TAKE ONE TABLET BY MOUTH EVERY DAY Dellar Fenton, MD Taking Active Self  calcitRIOL  (ROCALTROL ) 0.25 MCG capsule [id number] Yes Take 0.25 mcg by mouth daily. [provider] Taking Active Self  calcium  carbonate (OS-CAL - DOSED IN MG OF ELEMENTAL CALCIUM ) 1250 (500 Ca) MG tablet [id number] Yes Take 1  tablet (500 mg of elemental calcium  total) by mouth 3 (three) times daily with meals. Melvinia Stager, MD Taking Active Self  FARXIGA  10 MG TABS tablet [id number] Yes Take 10 mg by mouth daily. [provider] Taking Active Self  hydrALAZINE  (APRESOLINE ) 25 MG tablet [id number] Yes Take 1 tablet (25 mg total) by mouth 3 (three) times daily. Darlis Eisenmenger, MD Taking Active Self  isosorbide  mononitrate (IMDUR ) 30 MG 24 hr tablet [id number] Yes Take 1 tablet (30 mg total) by mouth daily. Darlis Eisenmenger, MD Taking Active Self  levothyroxine  (SYNTHROID ) 75 MCG tablet [id number] Yes TAKE ONE TABLET BY MOUTH EVERY DAY (STOP 137 MCG DOSE) Dellar Fenton, MD Taking Active Self  loratadine  (CLARITIN ) 10 MG tablet [id number] Yes Take 10 mg by mouth daily. [provider] Taking Active Self  omeprazole  (PRILOSEC) 20 MG capsule [id number] Yes Take 1 capsule (20 mg total) by mouth daily. Riddle, Suzann, NP Taking Active Self  Tiotropium Bromide -Olodaterol 2.5-2.5 MCG/ACT AERS [id number] No Inhale 2 puffs into the lungs daily.  Patient not taking: Reported on 12/07/2023   Dellar Fenton, MD Not Taking Active Self  torsemide  (DEMADEX ) 20 MG tablet [id number] Yes TAKE 60 MG ALTERNATING WITH 40 MG EVERY OTHER DAY Gollan, Timothy J, MD Taking Active Self           Med Note Marvene Slipper Dec 07, 2023  3:42 PM) Patient states (40 mg ) two tablets every morning and (20 mg) one tablet in afternoon  venlafaxine  XR (EFFEXOR -XR) 150 MG 24 hr capsule [id number] Yes TAKE ONE CAPSULE BY MOUTH EVERY DAY Dellar Fenton, MD Taking Active Self  Home Care and Equipment/Supplies: Were Home Health Services Ordered?: NA Any new equipment or medical supplies ordered?: Yes Name of Medical supply agency?: [employer] Were you able to get the equipment/medical supplies?: Yes Do you have any questions related to the use of the equipment/supplies?: No  Functional Questionnaire: Do you need assistance  with bathing/showering or dressing?: No Do you need assistance with meal preparation?: Yes Do you need assistance with eating?: No Do you have difficulty maintaining continence: No Do you need assistance with getting out of bed/getting out of a chair/moving?: No Do you have difficulty managing or taking your medications?: No  Follow up appointments reviewed: PCP Follow-up appointment confirmed?: No MD Provider Line Number:[phone] Given: Yes (Patientwill make appointment) Specialist Hospital Follow-up appointment confirmed?: Yes Date of Specialist follow-up appointment?: 12/14/23 Follow-Up Specialty Provider:: [id number] Dr Zelda Hickman, Dr Randy Buttery Do you need transportation to your follow-up appointment?: No Do you understand care options if your condition(s) worsen?: Yes-patient verbalized understanding  SDOH Interventions Today    Flowsheet Row Most Recent Value  SDOH Interventions   Food Insecurity Interventions Intervention Not Indicated  Housing Interventions Intervention Not Indicated  Transportation Interventions Intervention Not Indicated, Patient Resources (Friends/Family)  Utilities Interventions Intervention Not Indicated     Patient declined [facility] services  Una Ganser BSN RN [facility] Health Care Management Coordinator [email or web address] Direct Dial: [phone]  Fax: [phone] Website: [email or web address]

## 2023-12-16 ENCOUNTER — Telehealth: Payer: Self-pay | Admitting: Cardiovascular Disease

## 2023-12-16 ENCOUNTER — Encounter: Payer: Self-pay | Admitting: Oncology

## 2023-12-16 ENCOUNTER — Ambulatory Visit (INDEPENDENT_AMBULATORY_CARE_PROVIDER_SITE_OTHER): Payer: Medicare Other | Admitting: *Deleted

## 2023-12-16 ENCOUNTER — Inpatient Hospital Stay (HOSPITAL_BASED_OUTPATIENT_CLINIC_OR_DEPARTMENT_OTHER): Admitting: Oncology

## 2023-12-16 ENCOUNTER — Inpatient Hospital Stay: Attending: Oncology

## 2023-12-16 VITALS — BP 133/47 | HR 87 | Temp 98.6°F | Resp 16 | Wt 123.0 lb

## 2023-12-16 VITALS — Ht 65.0 in | Wt 123.0 lb

## 2023-12-16 DIAGNOSIS — N184 Chronic kidney disease, stage 4 (severe): Secondary | ICD-10-CM | POA: Insufficient documentation

## 2023-12-16 DIAGNOSIS — Z87891 Personal history of nicotine dependence: Secondary | ICD-10-CM | POA: Diagnosis not present

## 2023-12-16 DIAGNOSIS — I130 Hypertensive heart and chronic kidney disease with heart failure and stage 1 through stage 4 chronic kidney disease, or unspecified chronic kidney disease: Secondary | ICD-10-CM | POA: Insufficient documentation

## 2023-12-16 DIAGNOSIS — D631 Anemia in chronic kidney disease: Secondary | ICD-10-CM | POA: Insufficient documentation

## 2023-12-16 DIAGNOSIS — D509 Iron deficiency anemia, unspecified: Secondary | ICD-10-CM | POA: Diagnosis not present

## 2023-12-16 DIAGNOSIS — Z0000 Encounter for general adult medical examination without abnormal findings: Secondary | ICD-10-CM

## 2023-12-16 LAB — IRON AND TIBC
Iron: 46 ug/dL (ref 28–170)
Saturation Ratios: 20 % (ref 10.4–31.8)
TIBC: 228 ug/dL — ABNORMAL LOW (ref 250–450)
UIBC: 182 ug/dL

## 2023-12-16 LAB — CBC
HCT: 41.7 % (ref 36.0–46.0)
Hemoglobin: 13.6 g/dL (ref 12.0–15.0)
MCH: 30.3 pg (ref 26.0–34.0)
MCHC: 32.6 g/dL (ref 30.0–36.0)
MCV: 92.9 fL (ref 80.0–100.0)
Platelets: 155 10*3/uL (ref 150–400)
RBC: 4.49 MIL/uL (ref 3.87–5.11)
RDW: 14.6 % (ref 11.5–15.5)
WBC: 12.1 10*3/uL — ABNORMAL HIGH (ref 4.0–10.5)
nRBC: 0 % (ref 0.0–0.2)

## 2023-12-16 LAB — FERRITIN: Ferritin: 252 ng/mL (ref 11–307)

## 2023-12-16 NOTE — Progress Notes (Signed)
 Patient denies new or acute problems/concerns today.

## 2023-12-16 NOTE — Progress Notes (Signed)
 Subjective:   Dawn Sherman is a 68 y.o. who presents for a Medicare Wellness preventive visit.  Visit Complete: Virtual I connected with  Furman Jobs on 12/16/23 by a audio enabled telemedicine application and verified that I am speaking with the correct person using two identifiers.  Patient Location: Home  Provider Location: Home Office  I discussed the limitations of evaluation and management by telemedicine. The patient expressed understanding and agreed to proceed.  Vital Signs: Because this visit was a virtual/telehealth visit, some criteria may be missing or patient reported. Any vitals not documented were not able to be obtained and vitals that have been documented are patient reported.  VideoDeclined- This patient declined Librarian, academic. Therefore the visit was completed with audio only.  Persons Participating in Visit: Patient.  AWV Questionnaire: Yes: Patient Medicare AWV questionnaire was completed by the patient on 12/15/23; I have confirmed that all information answered by patient is correct and no changes since this date.  Cardiac Risk Factors include: advanced age (>50men, >18 women);dyslipidemia;hypertension;Other (see comment), Risk factor comments: AFib     Objective:    Today's Vitals   12/16/23 0853  Weight: 123 lb (55.8 kg)  Height: 5\' 5"  (1.651 m)   Body mass index is 20.47 kg/m.     12/16/2023    9:09 AM 12/08/2023    9:47 AM 12/07/2023   12:35 PM 10/28/2023    9:58 AM 02/03/2023    9:52 AM 12/16/2022    9:44 AM 12/12/2022    9:28 AM  Advanced Directives  Does Patient Have a Medical Advance Directive? No No No No No No No  Would patient like information on creating a medical advance directive? No - Patient declined No - Patient declined No - Patient declined No - Patient declined No - Patient declined No - Patient declined No - Patient declined    Current Medications (verified) Outpatient Encounter Medications as of  12/16/2023  Medication Sig   acetaminophen  (TYLENOL ) 325 MG tablet Take 650 mg by mouth every 6 (six) hours as needed (pain.).   atorvastatin  (LIPITOR) 40 MG tablet Take 1 tablet (40 mg total) by mouth daily.   buPROPion  (WELLBUTRIN  XL) 150 MG 24 hr tablet TAKE ONE TABLET BY MOUTH EVERY DAY   calcitRIOL  (ROCALTROL ) 0.25 MCG capsule Take 0.25 mcg by mouth daily.   calcium  carbonate (OS-CAL - DOSED IN MG OF ELEMENTAL CALCIUM ) 1250 (500 Ca) MG tablet Take 1 tablet (500 mg of elemental calcium  total) by mouth 3 (three) times daily with meals.   FARXIGA  10 MG TABS tablet Take 10 mg by mouth daily.   hydrALAZINE  (APRESOLINE ) 25 MG tablet Take 1 tablet (25 mg total) by mouth 3 (three) times daily.   isosorbide  mononitrate (IMDUR ) 30 MG 24 hr tablet Take 1 tablet (30 mg total) by mouth daily.   levothyroxine  (SYNTHROID ) 75 MCG tablet TAKE ONE TABLET BY MOUTH EVERY DAY (STOP 137 MCG DOSE)   loratadine  (CLARITIN ) 10 MG tablet Take 10 mg by mouth daily.   omeprazole  (PRILOSEC) 20 MG capsule Take 1 capsule (20 mg total) by mouth daily.   Tiotropium Bromide -Olodaterol 2.5-2.5 MCG/ACT AERS Inhale 2 puffs into the lungs daily.   torsemide  (DEMADEX ) 20 MG tablet TAKE 60 MG ALTERNATING WITH 40 MG EVERY OTHER DAY   venlafaxine  XR (EFFEXOR -XR) 150 MG 24 hr capsule TAKE ONE CAPSULE BY MOUTH EVERY DAY   No facility-administered encounter medications on file as of 12/16/2023.    Allergies (  verified) Sulfate, Codeine sulfate, Benicar [olmesartan], Amoxicillin, Clindamycin/lincomycin, Entresto  [sacubitril -valsartan ], Morphine and codeine, and Penicillins   History: Past Medical History:  Diagnosis Date   B12 deficiency    Cardiomyopathy (HCC)    a. 09/2022 Echo: EF 35-40%; b. 09/2022 MV: apical defect w/ mild peri-infarct ischemia. EF 43%.   Chronic HFrEF (heart failure with reduced ejection fraction) (HCC)    a. 01/2020 Echo: EF 30-35%; b. 07/2020 Echo: EF 50-55%; c. 06/2022 Echo: EF 50-55%; d. 09/2022 Echo: EF  35-40%, globh HK, mod reduced RV fxn, sev BAE, mod MR.   CKD (chronic kidney disease), stage IV (HCC)    COPD (chronic obstructive pulmonary disease) (HCC)    Depression    Endometriosis    Hypertension    Mixed hyperlipidemia    Moderate mitral regurgitation    Normocytic anemia    PAF (paroxysmal atrial fibrillation) (HCC)    a. CHA2DS2VASc = 4-->eliquis  2.5 bid.   Paroxymsal Atrial flutter (HCC)    Tobacco abuse    Past Surgical History:  Procedure Laterality Date   ABDOMINAL HYSTERECTOMY     ATRIAL FIBRILLATION ABLATION N/A 08/04/2023   Procedure: ATRIAL FIBRILLATION ABLATION;  Surgeon: Boyce Byes, MD;  Location: [facility] INVASIVE CV LAB;  Service: Cardiovascular;  Laterality: N/A;   BREAST BIOPSY Right 2015   neg-  FIBROADENOMA   CENTRAL LINE INSERTION  11/15/2022   Procedure: CENTRAL LINE INSERTION;  Surgeon: Sammy Crisp, MD;  Location: [facility] INVASIVE CV LAB;  Service: Cardiovascular;;   COLONOSCOPY WITH PROPOFOL  N/A 11/24/2022   Procedure: COLONOSCOPY WITH PROPOFOL ;  Surgeon: Shane Darling, MD;  Location: [facility] ENDOSCOPY;  Service: Endoscopy;  Laterality: N/A;   COLONOSCOPY WITH PROPOFOL  N/A 11/23/2022   Procedure: COLONOSCOPY WITH PROPOFOL ;  Surgeon: Shane Darling, MD;  Location: [facility] ENDOSCOPY;  Service: Endoscopy;  Laterality: N/A;   ESOPHAGOGASTRODUODENOSCOPY (EGD) WITH PROPOFOL  N/A 11/23/2022   Procedure: ESOPHAGOGASTRODUODENOSCOPY (EGD) WITH PROPOFOL ;  Surgeon: Shane Darling, MD;  Location: [facility] ENDOSCOPY;  Service: Endoscopy;  Laterality: N/A;   RIGHT HEART CATH N/A 11/15/2022   Procedure: RIGHT HEART CATH;  Surgeon: Sammy Crisp, MD;  Location: [facility] INVASIVE CV LAB;  Service: Cardiovascular;  Laterality: N/A;   RIGHT/LEFT HEART CATH AND CORONARY ANGIOGRAPHY N/A 11/25/2022   Procedure: RIGHT/LEFT HEART CATH AND CORONARY ANGIOGRAPHY;  Surgeon: Wenona Hamilton, MD;  Location: [facility] INVASIVE CV LAB;  Service: Cardiovascular;  Laterality: N/A;   TAH/RSO   1999   secondary to bleeding and endometriosis (Dr Juliann Ochoa)   TRANSESOPHAGEAL ECHOCARDIOGRAM (CATH LAB) N/A 08/04/2023   Procedure: TRANSESOPHAGEAL ECHOCARDIOGRAM;  Surgeon: Boyce Byes, MD;  Location: [facility] INVASIVE CV LAB;  Service: Cardiovascular;  Laterality: N/A;   Family History  Problem Relation Age of Onset   Hypercholesterolemia Mother    Rheum arthritis Father    Rheum arthritis Daughter    Fibromyalgia Daughter    Breast cancer Neg Hx    Colon cancer Neg Hx    Social History   Socioeconomic History   Marital status: Married    Spouse name: Not on file   Number of children: Not on file   Years of education: Not on file   Highest education level: 12th grade  Occupational History   Not on file  Tobacco Use   Smoking status: Former    Current packs/day: 0.25    Average packs/day: 0.3 packs/day for 40.0 years (10.0 ttl pk-yrs)    Types: Cigarettes   Smokeless tobacco: Never   Tobacco comments:  Patient quit smoking recently \\Quit  6 months ago  Vaping Use   Vaping status: Never Used  Substance and Sexual Activity   Alcohol use: Not Currently    Alcohol/week: 0.0 standard drinks of alcohol    Comment: occasional   Drug use: No   Sexual activity: Not Currently  Other Topics Concern   Not on file  Social History Narrative   Lives in [place] with her husband.  She is retired from [employer]	[place].  She does not routinely exercise.   Social Drivers of Corporate investment banker Strain: Low Risk  (12/15/2023)   Overall Financial Resource Strain (CARDIA)    Difficulty of Paying Living Expenses: Not hard at all  Food Insecurity: No Food Insecurity (12/15/2023)   Hunger Vital Sign    Worried About Running Out of Food in the Last Year: Never true    Ran Out of Food in the Last Year: Never true  Transportation Needs: No Transportation Needs (12/15/2023)   PRAPARE - Administrator, Civil Service (Medical): No    Lack of Transportation  (Non-Medical): No  Physical Activity: Inactive (12/15/2023)   Exercise Vital Sign    Days of Exercise per Week: 0 days    Minutes of Exercise per Session: 0 min  Stress: No Stress Concern Present (12/15/2023)   [employer] of Occupational Health - Occupational Stress Questionnaire    Feeling of Stress : Only a little  Social Connections: Socially Integrated (12/15/2023)   Social Connection and Isolation Panel [NHANES]    Frequency of Communication with Friends and Family: Three times a week    Frequency of Social Gatherings with Friends and Family: Three times a week    Attends Religious Services: 1 to 4 times per year    Active Member of Clubs or Organizations: Yes    Attends Banker Meetings: 1 to 4 times per year    Marital Status: Married    Tobacco Counseling Counseling given: Not Answered Tobacco comments: Patient quit smoking recently \\Quit  6 months ago    Clinical Intake:  Pre-visit preparation completed: Yes  Pain : No/denies pain     BMI - recorded: 20.47 Nutritional Status: BMI of 19-24  Normal Nutritional Risks: None Diabetes: No  Lab Results  Component Value Date   HGBA1C 4.8 01/02/2023   HGBA1C 6.0 (H) 11/14/2022   HGBA1C 5.6 09/23/2022     How often do you need to have someone help you when you read instructions, pamphlets, or other written materials from your doctor or pharmacy?: 1 - Never  Interpreter Needed?: No  Information entered by :: R. Lauriann Milillo LPN   Activities of Daily Living     12/15/2023    6:53 PM 12/08/2023    9:47 AM  In your present state of health, do you have any difficulty performing the following activities:  Hearing? 0 0  Vision? 0 0  Comment glasses   Difficulty concentrating or making decisions? 0 0  Walking or climbing stairs? 0   Dressing or bathing? 0   Doing errands, shopping? 1 0  Preparing Food and eating ? N   Using the Toilet? N   In the past six months, have you accidently leaked urine? N   Do  you have problems with loss of bowel control? N   Managing your Medications? N   Managing your Finances? N   Housekeeping or managing your Housekeeping? N     Patient Care Team: Dellar Fenton,  MD as PCP - General (Internal Medicine) Devorah Fonder, MD as PCP - Cardiology (Cardiology) Boyce Byes, MD as PCP - Electrophysiology (Cardiology) Marquita Situ Magali Schmitz, MD as Consulting Physician (General Surgery) Green, Davina E, RN as [facility] Care Management Avonne Boettcher, MD as Consulting Physician (Oncology)  Indicate any recent Medical Services you may have received from other than [facility] providers in the past year (date may be approximate).     Assessment:   This is a routine wellness examination for Dawn Sherman.  Hearing/Vision screen Hearing Screening - Comments:: No issues Vision Screening - Comments:: glasses   Goals Addressed             This Visit's Progress    Patient Stated       Wants to travel        Depression Screen     12/16/2023    9:05 AM 12/12/2023   10:29 AM 10/22/2023    7:13 AM 04/23/2023   12:16 PM 01/07/2023   10:19 AM 12/12/2022    9:27 AM 10/09/2022   10:59 AM  PHQ 2/9 Scores  PHQ - 2 Score 0 0 0 0 0 0 0  PHQ- 9 Score 0  0 0 0      Fall Risk     12/15/2023    6:53 PM 11/05/2023    3:09 PM 04/23/2023   12:16 PM 12/11/2022    9:14 AM 10/09/2022   10:59 AM  Fall Risk   Falls in the past year? 1 0 1 1 1   Number falls in past yr: 0  0 0 0  Injury with Fall? 1  0 1 1  Risk for fall due to : History of fall(s);Mental status change  History of fall(s) History of fall(s) History of fall(s)  Risk for fall due to: Comment passed out      Follow up Falls evaluation completed;Falls prevention discussed  Falls evaluation completed Falls prevention discussed Falls evaluation completed    MEDICARE RISK AT HOME:  Medicare Risk at Home Any stairs in or around the home?: (Patient-Rptd) Yes If so, are there any without handrails?:  (Patient-Rptd) No Home free of loose throw rugs in walkways, pet beds, electrical cords, etc?: (Patient-Rptd) Yes Adequate lighting in your home to reduce risk of falls?: (Patient-Rptd) Yes Life alert?: (Patient-Rptd) No Use of a cane, walker or w/c?: (Patient-Rptd) No Grab bars in the bathroom?: (Patient-Rptd) No Elevated toilet seat or a handicapped toilet?: (Patient-Rptd) No  TIMED UP AND GO:  Was the test performed?  No  Cognitive Function: 6CIT completed        12/16/2023    9:10 AM 12/12/2022    9:31 AM 11/27/2021    2:59 PM  6CIT Screen  What Year? 0 points 0 points 0 points  What month? 0 points 0 points 0 points  What time? 0 points 0 points   Count back from 20 0 points 0 points   Months in reverse 0 points 0 points 0 points  Repeat phrase 2 points 0 points 0 points  Total Score 2 points 0 points     Immunizations Immunization History  Administered Date(s) Administered   Fluad Quad(high Dose 65+) 07/23/2021, 05/06/2022, 07/07/2023   Influenza Inj Mdck Quad With Preservative 08/05/2019   Influenza,inj,Quad PF,6+ Mos 07/02/2018   Influenza-Unspecified 06/22/2020   PFIZER(Purple Top)SARS-COV-2 Vaccination 03/19/2020, 04/09/2020   Pneumococcal Polysaccharide-23 02/13/2020   Zoster Recombinant(Shingrix) 06/19/2021    Screening Tests Health Maintenance  Topic Date  Due   COVID-19 Vaccine (3 - Pfizer risk series) 05/07/2020   Zoster Vaccines- Shingrix (2 of 2) 08/14/2021   Medicare Annual Wellness (AWV)  12/12/2023   Pneumonia Vaccine 64+ Years old (2 of 2 - PCV) 10/21/2024 (Originally 02/12/2021)   INFLUENZA VACCINE  03/26/2024   MAMMOGRAM  06/24/2025   Colonoscopy  11/23/2032   DEXA SCAN  Completed   Hepatitis C Screening  Completed   HPV VACCINES  Aged Out   Meningococcal B Vaccine  Aged Out   DTaP/Tdap/Td  Discontinued    Health Maintenance  Health Maintenance Due  Topic Date Due   COVID-19 Vaccine (3 - Pfizer risk series) 05/07/2020   Zoster Vaccines-  Shingrix (2 of 2) 08/14/2021   Medicare Annual Wellness (AWV)  12/12/2023   Health Maintenance Items Addressed: Discussed with patient the need for tetanus (Tdap) and 2nd shingles vaccine.  Patient declines covid vaccine.  Additional Screening:  Vision Screening: Recommended annual ophthalmology exams for early detection of glaucoma and other disorders of the eye. Up to date [facility]  Dental Screening: Recommended annual dental exams for proper oral hygiene  Community Resource Referral / Chronic Care Management: CRR required this visit?  No   CCM required this visit?  No     Plan:     I have personally reviewed and noted the following in the patient's chart:   Medical and social history Use of alcohol, tobacco or illicit drugs  Current medications and supplements including opioid prescriptions. Patient is not currently taking opioid prescriptions. Functional ability and status Nutritional status Physical activity Advanced directives List of other physicians Hospitalizations, surgeries, and ER visits in previous 12 months Vitals Screenings to include cognitive, depression, and falls Referrals and appointments  In addition, I have reviewed and discussed with patient certain preventive protocols, quality metrics, and best practice recommendations. A written personalized care plan for preventive services as well as general preventive health recommendations were provided to patient.     Felicitas Horse, LPN   [id number]   After Visit Summary: (MyChart) Due to this being a telephonic visit, the after visit summary with patients personalized plan was offered to patient via MyChart   Notes: Please refer to Routing Comments.

## 2023-12-16 NOTE — Progress Notes (Signed)
 Reviewed note. Recent double vision. Please call and confirm doing ok. In reviewing chart, hematology recommending her to restart coumadin . Also, per note, planning to follow up with cardiology. Can schedule earlier appt (and hospital f/u). If any acute symptoms, needs to be evaluated.

## 2023-12-16 NOTE — Progress Notes (Signed)
 12/16/23 at 3:30PM - Outbound call to [facility].  Per Dr. Randy Buttery platelets now 155, previously 46; Dr. Randy Buttery instructed patient to resume Coumadin  at this time.  Informed Leah who will relay the information to the provider.  MY, RN

## 2023-12-16 NOTE — Patient Instructions (Signed)
 Dawn Sherman , Thank you for taking time to come for your Medicare Wellness Visit. I appreciate your ongoing commitment to your health goals. Please review the following plan we discussed and let me know if I can assist you in the future.   Referrals/Orders/Follow-Ups/Clinician Recommendations: Remember to get your tetanus (Tdap) and second shingles vaccine.   This is a list of the screening recommended for you and due dates:  Health Maintenance  Topic Date Due   COVID-19 Vaccine (3 - Pfizer risk series) 05/07/2020   Zoster (Shingles) Vaccine (2 of 2) 08/14/2021   Pneumonia Vaccine (2 of 2 - PCV) 10/21/2024*   Flu Shot  03/26/2024   Medicare Annual Wellness Visit  12/15/2024   Mammogram  06/24/2025   Colon Cancer Screening  11/23/2032   DEXA scan (bone density measurement)  Completed   Hepatitis C Screening  Completed   HPV Vaccine  Aged Out   Meningitis B Vaccine  Aged Out   DTaP/Tdap/Td vaccine  Discontinued  *Topic was postponed. The date shown is not the original due date.    Advanced directives: (Declined) Advance directive discussed with you today. Even though you declined this today, please call our office should you change your mind, and we can give you the proper paperwork for you to fill out.  Next Medicare Annual Wellness Visit scheduled for next year: Yes 12/20/2024 @ 9:30

## 2023-12-16 NOTE — Telephone Encounter (Signed)
 Mercedes from [facility] called just to give update per Dr. Randy Buttery. CBC rechecked today and platelets at 155 whereas previously were at 146 and Dr. Randy Buttery has told patient to resume coumadin  at this time. FYI

## 2023-12-18 ENCOUNTER — Encounter (HOSPITAL_COMMUNITY): Admission: RE | Payer: Self-pay | Source: Home / Self Care

## 2023-12-18 ENCOUNTER — Inpatient Hospital Stay (HOSPITAL_COMMUNITY): Admission: RE | Admit: 2023-12-18 | Payer: Medicare Other | Source: Home / Self Care | Admitting: Cardiology

## 2023-12-18 SURGERY — LEFT ATRIAL APPENDAGE OCCLUSION
Anesthesia: General

## 2023-12-21 ENCOUNTER — Encounter: Payer: Self-pay | Admitting: Oncology

## 2023-12-21 NOTE — Progress Notes (Signed)
 Hematology/Oncology Consult note [facility]  [phone] [phone]  Patient Care Team: Dellar Fenton, MD as PCP - General (Internal Medicine) Devorah Fonder, MD as PCP - Cardiology (Cardiology) Boyce Byes, MD as PCP - Electrophysiology (Cardiology) Marquita Situ, Magali Schmitz, MD as Consulting Physician (General Surgery) Green, Davina E, RN as [facility] Care Management Avonne Boettcher, MD as Consulting Physician (Oncology)   Name of the patient: Dawn Sherman  [id number]  1956/07/15   Date of visit: 12/21/23  Diagnosis-iron deficiency anemia with a component of anemia of chronic disease  Chief complaint/ Reason for visit-routine follow-up of anemia  Heme/Onc history: Patient is a 68 year old female with a past medical history significant for hypertension, A-fib presently on Coumadin  , CKD who was admitted to the ER after she had a fall.  Patient experienced some generalized weakness and dizziness prior to the fall and felt like she blacked out and fell to the floor hitting the left side of her head and face.  She had a CT head without contrast which showed acute bilateral subarachnoid hemorrhages predominantly along the left sylvian fissure and multiple small foci in the left frontal lobe.  Linear focus of subarachnoid hemorrhage in the right temporal lobe and right frontal lobe.  No significant edema or midline shift.  Subsequent MRI angio head without contrast showed normal intracranial MRA.  Patient was seen by neurosurgery and no surgical intervention is recommended.Platelet counts dropped down to 28 in April 2023 during that hospitalization. Smear review and LDH not indicated of TTP.  No evidence of DIC.  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  HIV hepatitis B hepatitis C and stool H. 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   Patient has history of heart failure with an EF of 25 to 30% with global hypokinesis.  She follows up with cardiology for the same.  She also has CKD stage IV.  She is on low-dose Eliquis  presently for her paroxysmal A-fib.   Patient last seen by me in May 2023 and now referred for anemia.CBC from April 2024 showed H&H of 9.1/28.5 with an MCV of 85.  Ferritin levels were normal at 112 with an iron saturation of 11%.  B12 levels were normal at 520.  TSH elevated during hospitalization at 165.  BMP shows elevated creatinine of 2.8.  Patient received IV iron in April 2024.  Interval history-patient reports feeling better after receiving IV iron.  She denies any blood loss in her stool or urine.  ECOG PS- 1 Pain scale- 0   Review of systems- Review of Systems  Constitutional:  Negative for chills, fever, malaise/fatigue and weight loss.  HENT:  Negative for congestion, ear discharge and nosebleeds.   Eyes:  Negative for blurred vision.  Respiratory:  Negative for cough, hemoptysis, sputum production, shortness of breath and wheezing.   Cardiovascular:  Negative for chest pain, palpitations, orthopnea and claudication.  Gastrointestinal:  Negative for abdominal pain, blood in stool, constipation, diarrhea, heartburn, melena, nausea and vomiting.  Genitourinary:  Negative for dysuria, flank pain, frequency, hematuria and urgency.  Musculoskeletal:  Negative for back pain, joint pain and myalgias.  Skin:  Negative for rash.  Neurological:  Negative for dizziness, tingling, focal weakness, seizures, weakness and headaches.  Endo/Heme/Allergies:  Does not bruise/bleed easily.  Psychiatric/Behavioral:  Negative for  depression and suicidal ideas. The patient does not have insomnia.       Allergies  Allergen Reactions   Sulfate Rash   Codeine Sulfate Nausea Only   Benicar [Olmesartan]     Talked with patient February 10, 2020, intolerance  is unclear, tried several medications around that time and one of them gave her a rash but she is not clear which 1.   Amoxicillin Rash   Clindamycin/Lincomycin Rash   Entresto  [Sacubitril -Valsartan ] Other (See Comments)    hyperkalemia   Morphine And Codeine Rash   Penicillins Rash     Past Medical History:  Diagnosis Date   B12 deficiency    Cardiomyopathy (HCC)    a. 09/2022 Echo: EF 35-40%; b. 09/2022 MV: apical defect w/ mild peri-infarct ischemia. EF 43%.   Chronic HFrEF (heart failure with reduced ejection fraction) (HCC)    a. 01/2020 Echo: EF 30-35%; b. 07/2020 Echo: EF 50-55%; c. 06/2022 Echo: EF 50-55%; d. 09/2022 Echo: EF 35-40%, globh HK, mod reduced RV fxn, sev BAE, mod MR.   CKD (chronic kidney disease), stage IV (HCC)    COPD (chronic obstructive pulmonary disease) (HCC)    Depression    Endometriosis    Hypertension    Mixed hyperlipidemia    Moderate mitral regurgitation    Normocytic anemia    PAF (paroxysmal atrial fibrillation) (HCC)    a. CHA2DS2VASc = 4-->eliquis  2.5 bid.   Paroxymsal Atrial flutter (HCC)    Tobacco abuse      Past Surgical History:  Procedure Laterality Date   ABDOMINAL HYSTERECTOMY     ATRIAL FIBRILLATION ABLATION N/A 08/04/2023   Procedure: ATRIAL FIBRILLATION ABLATION;  Surgeon: Boyce Byes, MD;  Location: [facility] INVASIVE CV LAB;  Service: Cardiovascular;  Laterality: N/A;   BREAST BIOPSY Right 2015   neg-  FIBROADENOMA   CENTRAL LINE INSERTION  11/15/2022   Procedure: CENTRAL LINE INSERTION;  Surgeon: Sammy Crisp, MD;  Location: [facility] INVASIVE CV LAB;  Service: Cardiovascular;;   COLONOSCOPY WITH PROPOFOL  N/A 11/24/2022   Procedure: COLONOSCOPY WITH PROPOFOL ;  Surgeon: Shane Darling, MD;  Location: [facility] ENDOSCOPY;  Service: Endoscopy;  Laterality: N/A;   COLONOSCOPY WITH PROPOFOL  N/A 11/23/2022   Procedure: COLONOSCOPY WITH PROPOFOL ;  Surgeon: Shane Darling, MD;  Location: [facility] ENDOSCOPY;  Service: Endoscopy;   Laterality: N/A;   ESOPHAGOGASTRODUODENOSCOPY (EGD) WITH PROPOFOL  N/A 11/23/2022   Procedure: ESOPHAGOGASTRODUODENOSCOPY (EGD) WITH PROPOFOL ;  Surgeon: Shane Darling, MD;  Location: [facility] ENDOSCOPY;  Service: Endoscopy;  Laterality: N/A;   RIGHT HEART CATH N/A 11/15/2022   Procedure: RIGHT HEART CATH;  Surgeon: Sammy Crisp, MD;  Location: [facility] INVASIVE CV LAB;  Service: Cardiovascular;  Laterality: N/A;   RIGHT/LEFT HEART CATH AND CORONARY ANGIOGRAPHY N/A 11/25/2022   Procedure: RIGHT/LEFT HEART CATH AND CORONARY ANGIOGRAPHY;  Surgeon: Wenona Hamilton, MD;  Location: [facility] INVASIVE CV LAB;  Service: Cardiovascular;  Laterality: N/A;   TAH/RSO  1999   secondary to bleeding and endometriosis (Dr Juliann Ochoa)   TRANSESOPHAGEAL ECHOCARDIOGRAM (CATH LAB) N/A 08/04/2023   Procedure: TRANSESOPHAGEAL ECHOCARDIOGRAM;  Surgeon: Boyce Byes, MD;  Location: [facility] INVASIVE CV LAB;  Service: Cardiovascular;  Laterality: N/A;    Social History   Socioeconomic History   Marital status: Married    Spouse name: Not on file   Number of children: Not on file   Years of education: Not on file   Highest education level: 12th grade  Occupational History   Not on file  Tobacco Use  Smoking status: Former    Current packs/day: 0.25    Average packs/day: 0.3 packs/day for 40.0 years (10.0 ttl pk-yrs)    Types: Cigarettes   Smokeless tobacco: Never   Tobacco comments:    Patient quit smoking recently \\Quit  6 months ago  Vaping Use   Vaping status: Never Used  Substance and Sexual Activity   Alcohol use: Not Currently    Alcohol/week: 0.0 standard drinks of alcohol    Comment: occasional   Drug use: No   Sexual activity: Not Currently  Other Topics Concern   Not on file  Social History Narrative   Lives in [place] with her husband.  She is retired from [employer]	[place].  She does not routinely exercise.   Social Drivers of Corporate investment banker Strain: Low Risk  (12/15/2023)    Overall Financial Resource Strain (CARDIA)    Difficulty of Paying Living Expenses: Not hard at all  Food Insecurity: No Food Insecurity (12/15/2023)   Hunger Vital Sign    Worried About Running Out of Food in the Last Year: Never true    Ran Out of Food in the Last Year: Never true  Transportation Needs: No Transportation Needs (12/15/2023)   PRAPARE - Administrator, Civil Service (Medical): No    Lack of Transportation (Non-Medical): No  Physical Activity: Inactive (12/15/2023)   Exercise Vital Sign    Days of Exercise per Week: 0 days    Minutes of Exercise per Session: 0 min  Stress: No Stress Concern Present (12/15/2023)   [employer] of Occupational Health - Occupational Stress Questionnaire    Feeling of Stress : Only a little  Social Connections: Socially Integrated (12/15/2023)   Social Connection and Isolation Panel [NHANES]    Frequency of Communication with Friends and Family: Three times a week    Frequency of Social Gatherings with Friends and Family: Three times a week    Attends Religious Services: 1 to 4 times per year    Active Member of Clubs or Organizations: Yes    Attends Banker Meetings: 1 to 4 times per year    Marital Status: Married  Catering manager Violence: Not At Risk (12/16/2023)   Humiliation, Afraid, Rape, and Kick questionnaire    Fear of Current or Ex-Partner: No    Emotionally Abused: No    Physically Abused: No    Sexually Abused: No    Family History  Problem Relation Age of Onset   Hypercholesterolemia Mother    Rheum arthritis Father    Rheum arthritis Daughter    Fibromyalgia Daughter    Breast cancer Neg Hx    Colon cancer Neg Hx      Current Outpatient Medications:    acetaminophen  (TYLENOL ) 325 MG tablet, Take 650 mg by mouth every 6 (six) hours as needed (pain.)., Disp: , Rfl:    atorvastatin  (LIPITOR) 40 MG tablet, Take 1 tablet (40 mg total) by mouth daily., Disp: 90 tablet, Rfl: 1   buPROPion   (WELLBUTRIN  XL) 150 MG 24 hr tablet, TAKE ONE TABLET BY MOUTH EVERY DAY, Disp: 90 tablet, Rfl: 1   calcitRIOL  (ROCALTROL ) 0.25 MCG capsule, Take 0.25 mcg by mouth daily., Disp: , Rfl:    calcium  carbonate (OS-CAL - DOSED IN MG OF ELEMENTAL CALCIUM ) 1250 (500 Ca) MG tablet, Take 1 tablet (500 mg of elemental calcium  total) by mouth 3 (three) times daily with meals., Disp: 60 tablet, Rfl: 1   FARXIGA  10 MG  TABS tablet, Take 10 mg by mouth daily., Disp: , Rfl:    hydrALAZINE  (APRESOLINE ) 25 MG tablet, Take 1 tablet (25 mg total) by mouth 3 (three) times daily., Disp: 90 tablet, Rfl: 5   isosorbide  mononitrate (IMDUR ) 30 MG 24 hr tablet, Take 1 tablet (30 mg total) by mouth daily., Disp: 90 tablet, Rfl: 3   levothyroxine  (SYNTHROID ) 75 MCG tablet, TAKE ONE TABLET BY MOUTH EVERY DAY (STOP 137 MCG DOSE), Disp: 30 tablet, Rfl: 1   loratadine  (CLARITIN ) 10 MG tablet, Take 10 mg by mouth daily., Disp: , Rfl:    omeprazole  (PRILOSEC) 20 MG capsule, Take 1 capsule (20 mg total) by mouth daily., Disp: , Rfl:    Tiotropium Bromide -Olodaterol 2.5-2.5 MCG/ACT AERS, Inhale 2 puffs into the lungs daily., Disp: 4 g, Rfl: 3   torsemide  (DEMADEX ) 20 MG tablet, TAKE 60 MG ALTERNATING WITH 40 MG EVERY OTHER DAY, Disp: 270 tablet, Rfl: 3   venlafaxine  XR (EFFEXOR -XR) 150 MG 24 hr capsule, TAKE ONE CAPSULE BY MOUTH EVERY DAY, Disp: 90 capsule, Rfl: 1  Physical exam:  Vitals:   12/16/23 1516  BP: (!) 133/47  Pulse: 87  Resp: 16  Temp: 98.6 F (37 C)  TempSrc: Tympanic  Weight: 123 lb (55.8 kg)   Physical Exam Cardiovascular:     Rate and Rhythm: Normal rate and regular rhythm.     Heart sounds: Normal heart sounds.  Pulmonary:     Effort: Pulmonary effort is normal.     Breath sounds: Normal breath sounds.  Abdominal:     General: Bowel sounds are normal.     Palpations: Abdomen is soft.  Skin:    General: Skin is warm and dry.  Neurological:     Mental Status: She is alert and oriented to person, place,  and time.      I have personally reviewed labs listed below:    Latest Ref Rng & Units 12/11/2023    5:04 AM  CMP  Glucose 70 - 99 mg/dL 144   BUN 8 - 23 mg/dL 60   Creatinine 3.15 - 1.00 mg/dL 4.00   Sodium 867 - 619 mmol/L 135   Potassium 3.5 - 5.1 mmol/L 4.5   Chloride 98 - 111 mmol/L 108   CO2 22 - 32 mmol/L 21   Calcium  8.9 - 10.3 mg/dL 8.2       Latest Ref Rng & Units 12/16/2023    2:53 PM  CBC  WBC 4.0 - 10.5 K/uL 12.1   Hemoglobin 12.0 - 15.0 g/dL 50.9   Hematocrit 32.6 - 46.0 % 41.7   Platelets 150 - 400 K/uL 155    I have personally reviewed Radiology images listed below: No images are attached to the encounter.  CT HEAD WO CONTRAST ( ) Result Date: 12/07/2023 CLINICAL DATA:  Syncope/presyncope, cerebrovascular cause suspected. Recent fall. EXAM: CT HEAD WITHOUT CONTRAST TECHNIQUE: Contiguous axial images were obtained from the base of the skull through the vertex without intravenous contrast. RADIATION DOSE REDUCTION: This exam was performed according to the departmental dose-optimization program which includes automated exposure control, adjustment of the mA and/or kV according to patient size and/or use of iterative reconstruction technique. COMPARISON:  Head CT 12/16/2021 and MRI 03/08/2022 FINDINGS: Brain: There is no evidence of an acute infarct, intracranial hemorrhage, mass, midline shift, or extra-axial fluid collection. Cerebral volume is within normal limits for age with normal size of the ventricles. Confluent hypodensities in the cerebral white matter and pons may have progressed and  are nonspecific but compatible with severe chronic small vessel ischemic disease. Vascular: Calcified atherosclerosis at the skull base. No hyperdense vessel. Skull: No acute fracture or suspicious lesion. Sinuses/Orbits: Visualized paranasal sinuses and mastoid air cells are clear. Unremarkable orbits. Other: None. IMPRESSION: 1. No evidence of acute intracranial abnormality. 2.  Severe chronic small vessel ischemic disease. Electronically Signed   By: Aundra Lee M.D.   On: 12/07/2023 18:02   ECHOCARDIOGRAM COMPLETE Result Date: 11/27/2023    ECHOCARDIOGRAM REPORT   Patient Name:   DAVON SEYDEL Date of Exam: 11/27/2023 Medical Rec #:  [id number]     Height:       65.0 in Accession #:    [id number]    Weight:       121.4 lb Date of Birth:  1956/04/23    BSA:          1.600 m Patient Age:    67 years      BP:           188/100 mmHg Patient Gender: F             HR:           89 bpm. Exam Location:  [facility] Procedure: 2D Echo, 3D Echo, Cardiac Doppler, Color Doppler and Strain Analysis            (Both Spectral and Color Flow Doppler were utilized during            procedure). Indications:     Chronic systolic heart failure I50.22                  Congestive heart failure I50.9  History:         Patient has prior history of Echocardiogram examinations, most                  recent 10/16/2023. Cardiomyopathy; Risk Factors:Hypertension.                  Moderate mitral regurgitation, Paroxysmal Afib.  Sonographer:     Broadus Canes Referring Phys:  [id number] Darlis Eisenmenger Diagnosing Phys: Antionette Kirks MD  Sonographer Comments: Global longitudinal strain was attempted. IMPRESSIONS  1. Left ventricular ejection fraction, by estimation, is 55 to 60%. The left ventricle has normal function. The left ventricle has no regional wall motion abnormalities. There is moderate left ventricular hypertrophy. Left ventricular diastolic parameters were normal.  2. Right ventricular systolic function is normal. The right ventricular size is normal. Tricuspid regurgitation signal is inadequate for assessing PA pressure.  3. Left atrial size was mildly dilated.  4. The mitral valve is normal in structure. Mild mitral valve regurgitation. No evidence of mitral stenosis.  5. The aortic valve is normal in structure. Aortic valve regurgitation is not visualized. No aortic stenosis is present.  6. The inferior vena cava is  normal in size with greater than 50% respiratory variability, suggesting right atrial pressure of 3 mmHg. Comparison(s): A prior study was performed on 11/13/2012. Changes from prior study are noted. The left ventricular function has improved. FINDINGS  Left Ventricle: Left ventricular ejection fraction, by estimation, is 55 to 60%. The left ventricle has normal function. The left ventricle has no regional wall motion abnormalities. Global longitudinal strain performed but not reported based on interpreter judgement due to suboptimal tracking. 3D ejection fraction reviewed and evaluated as part of the interpretation. Alternate measurement of EF is felt to be most reflective of LV function. The left ventricular internal  cavity size was normal in  size. There is moderate left ventricular hypertrophy. Left ventricular diastolic parameters were normal. Right Ventricle: The right ventricular size is normal. No increase in right ventricular wall thickness. Right ventricular systolic function is normal. Tricuspid regurgitation signal is inadequate for assessing PA pressure. Left Atrium: Left atrial size was mildly dilated. Right Atrium: Right atrial size was normal in size. Pericardium: There is no evidence of pericardial effusion. Mitral Valve: The mitral valve is normal in structure. Mild mitral annular calcification. Mild mitral valve regurgitation. No evidence of mitral valve stenosis. MV peak gradient, 3.6 mmHg. The mean mitral valve gradient is 2.0 mmHg. Tricuspid Valve: The tricuspid valve is normal in structure. Tricuspid valve regurgitation is trivial. No evidence of tricuspid stenosis. Aortic Valve: The aortic valve is normal in structure. Aortic valve regurgitation is not visualized. No aortic stenosis is present. Aortic valve mean gradient measures 3.0 mmHg. Aortic valve peak gradient measures 5.2 mmHg. Aortic valve area, by VTI measures 2.25 cm. Pulmonic Valve: The pulmonic valve was normal in structure.  Pulmonic valve regurgitation is not visualized. No evidence of pulmonic stenosis. Aorta: The aortic root is normal in size and structure. Venous: The inferior vena cava is normal in size with greater than 50% respiratory variability, suggesting right atrial pressure of 3 mmHg. IAS/Shunts: No atrial level shunt detected by color flow Doppler. Additional Comments: 3D was performed not requiring image post processing on an independent workstation and was indeterminate.  LEFT VENTRICLE PLAX 2D LVIDd:         4.00 cm   Diastology LVIDs:         2.60 cm   LV e' medial:    4.90 cm/s LV PW:         1.40 cm   LV E/e' medial:  13.7 LV IVS:        1.80 cm   LV e' lateral:   5.33 cm/s LVOT diam:     2.00 cm   LV E/e' lateral: 12.6 LV SV:         38 LV SV Index:   24 LVOT Area:     3.14 cm  RIGHT VENTRICLE RV Basal diam:  2.30 cm RV Mid diam:    1.80 cm RV S prime:     11.90 cm/s TAPSE (M-mode): 2.1 cm LEFT ATRIUM             Index        RIGHT ATRIUM          Index LA diam:        4.00 cm 2.50 cm/m   RA Area:     9.17 cm LA Vol (A2C):   63.4 ml 39.63 ml/m  RA Volume:   17.70 ml 11.06 ml/m LA Vol (A4C):   33.3 ml 20.81 ml/m LA Biplane Vol: 50.0 ml 31.25 ml/m  AORTIC VALVE AV Area (Vmax):    2.04 cm AV Area (Vmean):   1.97 cm AV Area (VTI):     2.25 cm AV Vmax:           114.00 cm/s AV Vmean:          69.700 cm/s AV VTI:            0.170 m AV Peak Grad:      5.2 mmHg AV Mean Grad:      3.0 mmHg LVOT Vmax:         74.20 cm/s LVOT Vmean:  43.700 cm/s LVOT VTI:          0.122 m LVOT/AV VTI ratio: 0.72  AORTA Ao Root diam: 3.00 cm MITRAL VALVE               TRICUSPID VALVE MV Area (PHT): 2.68 cm    TR Peak grad:   8.5 mmHg MV Area VTI:   1.45 cm    TR Vmax:        146.00 cm/s MV Peak grad:  3.6 mmHg MV Mean grad:  2.0 mmHg    SHUNTS MV Vmax:       0.95 m/s    Systemic VTI:  0.12 m MV Vmean:      61.8 cm/s   Systemic Diam: 2.00 cm MV Decel Time: 283 msec MV E velocity: 67.10 cm/s MV A velocity: 69.20 cm/s MV E/A  ratio:  0.97 Antionette Kirks MD Electronically signed by Antionette Kirks MD Signature Date/Time: 11/27/2023/12:56:09 PM    Final      Assessment and plan- Patient is a 68 y.o. female with history of anemia of  chronic kidney disease here for routine follow-up  After receiving IV iron patient's ferritin has improved from 25-52.  Her hemoglobin is presently 13.6 and she does not require any IV iron at this time.  CBC to be checked in 6 weeks and I will see her back in 3 months   Visit Diagnosis 1. Iron deficiency anemia, unspecified iron deficiency anemia type   2. Anemia of chronic kidney failure, stage 4 (severe) (HCC)      Dr. Seretha Dance, MD, MPH [facility] [phone] 12/21/2023 1:06 PM

## 2023-12-24 ENCOUNTER — Ambulatory Visit: Attending: Cardiovascular Disease

## 2023-12-24 ENCOUNTER — Other Ambulatory Visit: Payer: Self-pay

## 2023-12-24 ENCOUNTER — Ambulatory Visit: Payer: Self-pay

## 2023-12-24 DIAGNOSIS — I480 Paroxysmal atrial fibrillation: Secondary | ICD-10-CM | POA: Diagnosis present

## 2023-12-24 DIAGNOSIS — Z7901 Long term (current) use of anticoagulants: Secondary | ICD-10-CM | POA: Insufficient documentation

## 2023-12-24 LAB — POCT INR: INR: 1.6 — AB (ref 2.0–3.0)

## 2023-12-24 NOTE — Patient Instructions (Signed)
 TAKE ANOTHER 1 TABLET TODAY ONLY then Continue taking Warfarin 1.5 tablets daily.  INR in 1 week.  [phone]

## 2023-12-24 NOTE — Patient Outreach (Signed)
 Complex Care Management   Visit Note  12/24/2023  Name:  Dawn Sherman MRN: [id number] DOB: 09-Aug-1956  Situation: Referral received for Complex Care Management related to Heart Failure I obtained verbal consent from Patient.  Visit completed with patient  on the phone  Background:   Past Medical History:  Diagnosis Date   B12 deficiency    Cardiomyopathy (HCC)    a. 09/2022 Echo: EF 35-40%; b. 09/2022 MV: apical defect w/ mild peri-infarct ischemia. EF 43%.   Chronic HFrEF (heart failure with reduced ejection fraction) (HCC)    a. 01/2020 Echo: EF 30-35%; b. 07/2020 Echo: EF 50-55%; c. 06/2022 Echo: EF 50-55%; d. 09/2022 Echo: EF 35-40%, globh HK, mod reduced RV fxn, sev BAE, mod MR.   CKD (chronic kidney disease), stage IV (HCC)    COPD (chronic obstructive pulmonary disease) (HCC)    Depression    Endometriosis    Hypertension    Mixed hyperlipidemia    Moderate mitral regurgitation    Normocytic anemia    PAF (paroxysmal atrial fibrillation) (HCC)    a. CHA2DS2VASc = 4-->eliquis  2.5 bid.   Paroxymsal Atrial flutter (HCC)    Tobacco abuse     Assessment: Patient Reported Symptoms:  Cognitive Cognitive Status: Alert and oriented to person, place, and time, Insightful and able to interpret abstract concepts, Normal speech and language skills Cognitive/Intellectual Conditions Management [RPT]: None reported or documented in medical history or problem list   Health Maintenance Behaviors: Annual physical exam, Immunizations Healing Pattern: Average  Neurological Neurological Review of Symptoms: No symptoms reported    HEENT HEENT Symptoms Reported: No symptoms reported      Cardiovascular Cardiovascular Symptoms Reported: No symptoms reported Cardiovascular Conditions: Hypertension, Heart failure Cardiovascular Management Strategies: Routine screening, Medication therapy Weight: 123 lb (55.8 kg) Cardiovascular Self-Management Outcome: 4 (good) Cardiovascular Comment:  patient reports Z[employer] monitor completed and is being mailed back to company today. Patient states her watchman procedure was cancelled due to recent hospitalization. She states new procedure date has not been rescheduled. Confirmed patient aware of next cardiology appointment scheduled for 12/31/23. Patient reports follow up appointment with primary care provider is 12/26/23  Respiratory Respiratory Symptoms Reported: No symptoms reported    Endocrine Patient reports the following symptoms related to hypoglycemia or hyperglycemia : No symptoms reported Is patient diabetic?: No    Gastrointestinal Gastrointestinal Symptoms Reported: No symptoms reported      Genitourinary Genitourinary Symptoms Reported: No symptoms reported Genitourinary Conditions: Chronic kidney disease Genitourinary Management Strategies:  (routine follow up) Genitourinary Self-Management Outcome: 4 (good) Genitourinary Comment: Patient reports next follow up appointment with nephrologist is 12/29/23  Integumentary Integumentary Symptoms Reported: No symptoms reported    Musculoskeletal Musculoskelatal Symptoms Reviewed: No symptoms reported        Psychosocial Psychosocial Symptoms Reported: No symptoms reported Behavioral Health Conditions: Depression Behavioral Management Strategies: Medication therapy (routine follow up) Behavioral Health Self-Management Outcome: 4 (good) Major Change/Loss/Stressor/Fears (CP): Denies Techniques to Cope with Loss/Stress/Change: Diversional activities, Medication Quality of Family Relationships: supportive Do you feel physically threatened by others?: No      12/24/2023    4:58 PM  Depression screen PHQ 2/9  Decreased Interest 0  Down, Depressed, Hopeless 0  PHQ - 2 Score 0    Vitals:   12/24/23 1425  BP: 138/72    Medications Reviewed Today     Reviewed by Tyffani Foglesong E, RN (Registered Nurse) on 12/24/23 at 1428  Med List Status: <None>   Medication Order Taking?  Sig  Documenting Provider Last Dose Status Informant  acetaminophen  (TYLENOL ) 325 MG tablet [id number] Yes Take 650 mg by mouth every 6 (six) hours as needed (pain.). [provider] Taking Active Self           Med Note Andy Bannister, Jenine Mix Dec 07, 2023  3:43 PM) prn  atorvastatin  (LIPITOR) 40 MG tablet [id number] Yes Take 1 tablet (40 mg total) by mouth daily. Dellar Fenton, MD Taking Active Self  buPROPion  (WELLBUTRIN  XL) 150 MG 24 hr tablet [id number] Yes TAKE ONE TABLET BY MOUTH EVERY DAY Dellar Fenton, MD Taking Active Self  calcitRIOL  (ROCALTROL ) 0.25 MCG capsule [id number] Yes Take 0.25 mcg by mouth daily. [provider] Taking Active Self  calcium  carbonate (OS-CAL - DOSED IN MG OF ELEMENTAL CALCIUM ) 1250 (500 Ca) MG tablet [id number] Yes Take 1 tablet (500 mg of elemental calcium  total) by mouth 3 (three) times daily with meals. Melvinia Stager, MD Taking Active Self  FARXIGA  10 MG TABS tablet [id number] Yes Take 10 mg by mouth daily. [provider] Taking Active Self  hydrALAZINE  (APRESOLINE ) 25 MG tablet [id number] Yes Take 1 tablet (25 mg total) by mouth 3 (three) times daily. Darlis Eisenmenger, MD Taking Active Self  isosorbide  mononitrate (IMDUR ) 30 MG 24 hr tablet [id number] Yes Take 1 tablet (30 mg total) by mouth daily. Darlis Eisenmenger, MD Taking Active Self  levothyroxine  (SYNTHROID ) 75 MCG tablet [id number] Yes TAKE ONE TABLET BY MOUTH EVERY DAY (STOP 137 MCG DOSE) Dellar Fenton, MD Taking Active Self  loratadine  (CLARITIN ) 10 MG tablet [id number] Yes Take 10 mg by mouth daily. [provider] Taking Active Self  omeprazole  (PRILOSEC) 20 MG capsule [id number] Yes Take 1 capsule (20 mg total) by mouth daily. Riddle, Suzann, NP Taking Active Self  Tiotropium Bromide -Olodaterol 2.5-2.5 MCG/ACT AERS [id number] No Inhale 2 puffs into the lungs daily.  Patient not taking: Reported on 12/24/2023   Dellar Fenton, MD Not Taking Active Self  torsemide   (DEMADEX ) 20 MG tablet [id number] Yes TAKE 60 MG ALTERNATING WITH 40 MG EVERY OTHER DAY Gollan, Timothy J, MD Taking Active Self           Med Note Marvene Slipper Dec 07, 2023  3:42 PM) Patient states (40 mg ) two tablets every morning and (20 mg) one tablet in afternoon  venlafaxine  XR (EFFEXOR -XR) 150 MG 24 hr capsule [id number] Yes TAKE ONE CAPSULE BY MOUTH EVERY DAY Dellar Fenton, MD Taking Active Self            Recommendation:   PCP Follow-up  Follow Up Plan:   Telephone follow-up in 1 month with RN case manager  Verba Girt RN, BSN, CCM [place]  [facility], Population Health Case Manager Phone: [phone]

## 2023-12-24 NOTE — Patient Instructions (Signed)
 Visit Information  Thank you for taking time to visit with me today. Please don't hesitate to contact me if I can be of assistance to you before our next scheduled appointment.  Our next appointment is by telephone on 01/27/24 at 2 pm Please call the care guide team at [phone] if you need to cancel or reschedule your appointment.   Following is a copy of your care plan:   Goals Addressed             This Visit's Progress    COMPLETED: Continued improvement post hospitalization and management/ education of health conditions.       Interventions Today    Flowsheet Row Most Recent Value  Chronic Disease   Chronic disease during today's visit Congestive Heart Failure (CHF), Hypertension (HTN), Atrial Fibrillation (AFib)  General Interventions   General Interventions Discussed/Reviewed General Interventions Reviewed, Doctor Visits  [evaluation of current treatment plan for listed health conditions and patients adherence to plan as established by provider.  Assessed for HF/ atrial fibrillation symptoms and blood pressure readings.]  Doctor Visits Discussed/Reviewed Doctor Visits Reviewed  Carin Charleston upcoming provider visits. Advised to keep follow up visits with providers as recommended.]  Education Interventions   Education Provided Provided Education  [Discussed upcoming watchman procedfure scheduled for 12/19/23. Advised ongoing monitoring of blood pressure and to report blood pressure readings to provider outside of established parameters.]  Provided Verbal Education On Other  [Reviewed heart failure/ atrial fibrillation symptoms. Advised ongoing home self monitoring of weight, pulse on oxygen level.  Discussed where patient is in the transplant process.]  Pharmacy Interventions   Pharmacy Dicussed/Reviewed Pharmacy Topics Reviewed  [medications reviewed and compliance discussed.]           VBCI RN Care Plan- HF       Problems:  Chronic Disease Management support and education  needs related to CHF  Goal: Over the next 3 months the Patient will attend all scheduled medical appointments: with providers as evidenced by patient report/ chart review.         continue to work with Medical illustrator and/or Social Worker to address care management and care coordination needs related to CHF as evidenced by adherence to care management team scheduled appointments     demonstrate Ongoing adherence to prescribed treatment plan for CHF as evidenced by patient report /chart review.  take all medications exactly as prescribed and will call provider for medication related questions as evidenced by patient report / chart review.      Interventions:   Heart Failure Interventions: Screening for signs and symptoms of depression related to chronic disease state  Reviewed heart failure action plan/ zones and advised patient to notify provider for increase in symptoms. Advised to call 911 for severe heart failure symptoms Advised to take medications as prescribed Advised to weigh daily and record. Advised to monitor blood pressure daily and report readings to provider outside of established parameter Advised to follow a low salt diet Advised to keep follow up appointments with providers Assessed social determinants of health  Advised to wear oxygen according to provider recommendations.   Patient Self-Care Activities:  Attend all scheduled provider appointments Call pharmacy for medication refills 3-7 days in advance of running out of medications Call provider office for new concerns or questions  Take medications as prescribed   call office if I gain more than 2 pounds in one day or 5 pounds in one week watch for swelling in feet, ankles and legs every  day weigh myself daily follow rescue plan if symptoms flare-up Monitor blood pressure daily and report readings to provider outside of established parameter. Continue to wear oxygen as recommended by provider.   Plan:   Telephone follow up appointment with care management team member scheduled for:  12/24/23 at 2 pm             Please call the Suicide and Crisis Lifeline: 988 call 1-800-273-TALK (toll free, 24 hour hotline) if you are experiencing a Mental Health or Behavioral Health Crisis or need someone to talk to.  Patient verbalizes understanding of instructions and care plan provided today and agrees to view in MyChart. Active MyChart status and patient understanding of how to access instructions and care plan via MyChart confirmed with patient.     Verba Girt RN, BSN, CCM [employer], Population Health Case Manager Phone: [phone]

## 2023-12-26 ENCOUNTER — Ambulatory Visit (INDEPENDENT_AMBULATORY_CARE_PROVIDER_SITE_OTHER): Admitting: Internal Medicine

## 2023-12-26 ENCOUNTER — Encounter: Payer: Self-pay | Admitting: Internal Medicine

## 2023-12-26 VITALS — BP 128/74 | HR 90 | Temp 98.0°F | Resp 16 | Ht 65.0 in | Wt 118.8 lb

## 2023-12-26 DIAGNOSIS — I4891 Unspecified atrial fibrillation: Secondary | ICD-10-CM | POA: Diagnosis not present

## 2023-12-26 DIAGNOSIS — D649 Anemia, unspecified: Secondary | ICD-10-CM

## 2023-12-26 DIAGNOSIS — N184 Chronic kidney disease, stage 4 (severe): Secondary | ICD-10-CM | POA: Diagnosis not present

## 2023-12-26 DIAGNOSIS — F320 Major depressive disorder, single episode, mild: Secondary | ICD-10-CM

## 2023-12-26 DIAGNOSIS — E039 Hypothyroidism, unspecified: Secondary | ICD-10-CM

## 2023-12-26 DIAGNOSIS — E782 Mixed hyperlipidemia: Secondary | ICD-10-CM

## 2023-12-26 DIAGNOSIS — E7800 Pure hypercholesterolemia, unspecified: Secondary | ICD-10-CM | POA: Diagnosis not present

## 2023-12-26 DIAGNOSIS — I10 Essential (primary) hypertension: Secondary | ICD-10-CM

## 2023-12-26 DIAGNOSIS — D693 Immune thrombocytopenic purpura: Secondary | ICD-10-CM

## 2023-12-26 DIAGNOSIS — J439 Emphysema, unspecified: Secondary | ICD-10-CM

## 2023-12-26 DIAGNOSIS — I5030 Unspecified diastolic (congestive) heart failure: Secondary | ICD-10-CM

## 2023-12-26 DIAGNOSIS — I5032 Chronic diastolic (congestive) heart failure: Secondary | ICD-10-CM

## 2023-12-26 NOTE — Progress Notes (Unsigned)
 Subjective:    Patient ID: Dawn Sherman, female    DOB: 1956-02-05, 68 y.o.   MRN: [id number]  Patient here for  Chief Complaint  Patient presents with   Hospitalization Follow-up    HPI Here for hospital follow up. Hospitalized 12/07/23 - 12/11/23. Presented with low heart rate and acute on chronic renal failure. PTA had 10 days of nausea, vomiting and diarrhea.  Given IVFs and zofran . Cardiology and hematology consulted. Pulse improved to normal range after adjusting medication. Platelet count increased after decadron  administration. Had f/u with hematology 12/16/23 - hgb 13.6. platelet count - 155. Restarted back on coumadin . Is being followed - coumadin  clinic. Reports she is feeling better. Breathing stable. Denies increased sob. No chest pain. Eating. No vomiting or diarrhea. Seeing nephrology Monday. Discussed getting f/u labs through their office. Will hold on drawing today.    Past Medical History:  Diagnosis Date   B12 deficiency    Cardiomyopathy (HCC)    a. 09/2022 Echo: EF 35-40%; b. 09/2022 MV: apical defect w/ mild peri-infarct ischemia. EF 43%.   Chronic HFrEF (heart failure with reduced ejection fraction) (HCC)    a. 01/2020 Echo: EF 30-35%; b. 07/2020 Echo: EF 50-55%; c. 06/2022 Echo: EF 50-55%; d. 09/2022 Echo: EF 35-40%, globh HK, mod reduced RV fxn, sev BAE, mod MR.   CKD (chronic kidney disease), stage IV (HCC)    COPD (chronic obstructive pulmonary disease) (HCC)    Depression    Endometriosis    Hypertension    Mixed hyperlipidemia    Moderate mitral regurgitation    Normocytic anemia    PAF (paroxysmal atrial fibrillation) (HCC)    a. CHA2DS2VASc = 4-->eliquis  2.5 bid.   Paroxymsal Atrial flutter (HCC)    Tobacco abuse    Past Surgical History:  Procedure Laterality Date   ABDOMINAL HYSTERECTOMY     ATRIAL FIBRILLATION ABLATION N/A 08/04/2023   Procedure: ATRIAL FIBRILLATION ABLATION;  Surgeon: Boyce Byes, MD;  Location: [facility] INVASIVE CV LAB;  Service:  Cardiovascular;  Laterality: N/A;   BREAST BIOPSY Right 2015   neg-  FIBROADENOMA   CENTRAL LINE INSERTION  11/15/2022   Procedure: CENTRAL LINE INSERTION;  Surgeon: Sammy Crisp, MD;  Location: [facility] INVASIVE CV LAB;  Service: Cardiovascular;;   COLONOSCOPY WITH PROPOFOL  N/A 11/24/2022   Procedure: COLONOSCOPY WITH PROPOFOL ;  Surgeon: Shane Darling, MD;  Location: [facility] ENDOSCOPY;  Service: Endoscopy;  Laterality: N/A;   COLONOSCOPY WITH PROPOFOL  N/A 11/23/2022   Procedure: COLONOSCOPY WITH PROPOFOL ;  Surgeon: Shane Darling, MD;  Location: [facility] ENDOSCOPY;  Service: Endoscopy;  Laterality: N/A;   ESOPHAGOGASTRODUODENOSCOPY (EGD) WITH PROPOFOL  N/A 11/23/2022   Procedure: ESOPHAGOGASTRODUODENOSCOPY (EGD) WITH PROPOFOL ;  Surgeon: Shane Darling, MD;  Location: [facility] ENDOSCOPY;  Service: Endoscopy;  Laterality: N/A;   RIGHT HEART CATH N/A 11/15/2022   Procedure: RIGHT HEART CATH;  Surgeon: Sammy Crisp, MD;  Location: [facility] INVASIVE CV LAB;  Service: Cardiovascular;  Laterality: N/A;   RIGHT/LEFT HEART CATH AND CORONARY ANGIOGRAPHY N/A 11/25/2022   Procedure: RIGHT/LEFT HEART CATH AND CORONARY ANGIOGRAPHY;  Surgeon: Wenona Hamilton, MD;  Location: [facility] INVASIVE CV LAB;  Service: Cardiovascular;  Laterality: N/A;   TAH/RSO  1999   secondary to bleeding and endometriosis (Dr Juliann Ochoa)   TRANSESOPHAGEAL ECHOCARDIOGRAM (CATH LAB) N/A 08/04/2023   Procedure: TRANSESOPHAGEAL ECHOCARDIOGRAM;  Surgeon: Boyce Byes, MD;  Location: [facility] INVASIVE CV LAB;  Service: Cardiovascular;  Laterality: N/A;   Family History  Problem Relation Age of Onset  Hypercholesterolemia Mother    Rheum arthritis Father    Rheum arthritis Daughter    Fibromyalgia Daughter    Breast cancer Neg Hx    Colon cancer Neg Hx    Social History   Socioeconomic History   Marital status: Married    Spouse name: Not on file   Number of children: Not on file   Years of education: Not on file   Highest  education level: 12th grade  Occupational History   Not on file  Tobacco Use   Smoking status: Former    Current packs/day: 0.25    Average packs/day: 0.3 packs/day for 40.0 years (10.0 ttl pk-yrs)    Types: Cigarettes   Smokeless tobacco: Never   Tobacco comments:    Patient quit smoking recently \\Quit  6 months ago  Vaping Use   Vaping status: Never Used  Substance and Sexual Activity   Alcohol use: Not Currently    Alcohol/week: 0.0 standard drinks of alcohol    Comment: occasional   Drug use: No   Sexual activity: Not Currently  Other Topics Concern   Not on file  Social History Narrative   Lives in [place] with her husband.  She is retired from [employer]	[place].  She does not routinely exercise.   Social Drivers of Corporate investment banker Strain: Low Risk  (12/15/2023)   Overall Financial Resource Strain (CARDIA)    Difficulty of Paying Living Expenses: Not hard at all  Food Insecurity: No Food Insecurity (12/24/2023)   Hunger Vital Sign    Worried About Running Out of Food in the Last Year: Never true    Ran Out of Food in the Last Year: Never true  Transportation Needs: No Transportation Needs (12/24/2023)   PRAPARE - Administrator, Civil Service (Medical): No    Lack of Transportation (Non-Medical): No  Physical Activity: Inactive (12/15/2023)   Exercise Vital Sign    Days of Exercise per Week: 0 days    Minutes of Exercise per Session: 0 min  Stress: No Stress Concern Present (12/15/2023)   [employer] of Occupational Health - Occupational Stress Questionnaire    Feeling of Stress : Only a little  Social Connections: Socially Integrated (12/15/2023)   Social Connection and Isolation Panel [NHANES]    Frequency of Communication with Friends and Family: Three times a week    Frequency of Social Gatherings with Friends and Family: Three times a week    Attends Religious Services: 1 to 4 times per year    Active Member of Clubs or  Organizations: Yes    Attends Banker Meetings: 1 to 4 times per year    Marital Status: Married     Review of Systems  Constitutional:  Negative for appetite change and unexpected weight change.  HENT:  Negative for congestion and sinus pressure.   Respiratory:  Negative for cough, chest tightness and shortness of breath.   Cardiovascular:  Negative for chest pain, palpitations and leg swelling.  Gastrointestinal:  Negative for abdominal pain, diarrhea, nausea and vomiting.  Genitourinary:  Negative for difficulty urinating and dysuria.  Musculoskeletal:  Negative for joint swelling and myalgias.  Skin:  Negative for color change and rash.  Neurological:  Negative for dizziness and headaches.  Psychiatric/Behavioral:  Negative for agitation and dysphoric mood.        Objective:     BP 128/74   Pulse 90   Temp 98 F (36.7 C)  Resp 16   Ht 5\' 5"  (1.651 m)   Wt 118 lb 12.8 oz (53.9 kg)   SpO2 98%   BMI 19.77 kg/m  Wt Readings from Last 3 Encounters:  12/26/23 118 lb 12.8 oz (53.9 kg)  12/24/23 123 lb (55.8 kg)  12/16/23 123 lb (55.8 kg)    Physical Exam Vitals reviewed.  Constitutional:      General: She is not in acute distress.    Appearance: Normal appearance.  HENT:     Head: Normocephalic and atraumatic.     Right Ear: External ear normal.     Left Ear: External ear normal.     Mouth/Throat:     Pharynx: No oropharyngeal exudate or posterior oropharyngeal erythema.  Eyes:     General: No scleral icterus.       Right eye: No discharge.        Left eye: No discharge.     Conjunctiva/sclera: Conjunctivae normal.  Neck:     Thyroid : No thyromegaly.  Cardiovascular:     Rate and Rhythm: Normal rate and regular rhythm.  Pulmonary:     Effort: No respiratory distress.     Breath sounds: Normal breath sounds. No wheezing.  Abdominal:     General: Bowel sounds are normal.     Palpations: Abdomen is soft.     Tenderness: There is no abdominal  tenderness.  Musculoskeletal:        General: No swelling or tenderness.     Cervical back: Neck supple. No tenderness.  Lymphadenopathy:     Cervical: No cervical adenopathy.  Skin:    Findings: No erythema or rash.  Neurological:     Mental Status: She is alert.  Psychiatric:        Mood and Affect: Mood normal.        Behavior: Behavior normal.         Outpatient Encounter Medications as of 12/26/2023  Medication Sig   acetaminophen  (TYLENOL ) 325 MG tablet Take 650 mg by mouth every 6 (six) hours as needed (pain.).   atorvastatin  (LIPITOR) 40 MG tablet Take 1 tablet (40 mg total) by mouth daily.   buPROPion  (WELLBUTRIN  XL) 150 MG 24 hr tablet TAKE ONE TABLET BY MOUTH EVERY DAY   calcitRIOL  (ROCALTROL ) 0.25 MCG capsule Take 0.25 mcg by mouth daily.   calcium  carbonate (OS-CAL - DOSED IN MG OF ELEMENTAL CALCIUM ) 1250 (500 Ca) MG tablet Take 1 tablet (500 mg of elemental calcium  total) by mouth 3 (three) times daily with meals.   FARXIGA  10 MG TABS tablet Take 10 mg by mouth daily.   hydrALAZINE  (APRESOLINE ) 25 MG tablet Take 1 tablet (25 mg total) by mouth 3 (three) times daily.   isosorbide  mononitrate (IMDUR ) 30 MG 24 hr tablet Take 1 tablet (30 mg total) by mouth daily.   levothyroxine  (SYNTHROID ) 75 MCG tablet TAKE ONE TABLET BY MOUTH EVERY DAY (STOP 137 MCG DOSE)   loratadine  (CLARITIN ) 10 MG tablet Take 10 mg by mouth daily.   omeprazole  (PRILOSEC) 20 MG capsule Take 1 capsule (20 mg total) by mouth daily.   torsemide  (DEMADEX ) 20 MG tablet TAKE 60 MG ALTERNATING WITH 40 MG EVERY OTHER DAY   venlafaxine  XR (EFFEXOR -XR) 150 MG 24 hr capsule TAKE ONE CAPSULE BY MOUTH EVERY DAY   [DISCONTINUED] Tiotropium Bromide -Olodaterol 2.5-2.5 MCG/ACT AERS Inhale 2 puffs into the lungs daily. (Patient not taking: Reported on 12/24/2023)   No facility-administered encounter medications on file as of 12/26/2023.  Lab Results  Component Value Date   WBC 12.1 (H) 12/16/2023   HGB 13.6  12/16/2023   HCT 41.7 12/16/2023   PLT 155 12/16/2023   GLUCOSE 122 (H) 12/11/2023   CHOL 164 01/02/2023   TRIG 64.0 01/02/2023   HDL 83.70 01/02/2023   LDLDIRECT 53.3 10/14/2012   LDLCALC 68 01/02/2023   ALT 13 12/07/2023   AST 15 12/07/2023   NA 135 12/11/2023   K 4.5 12/11/2023   CL 108 12/11/2023   CREATININE 2.86 (H) 12/11/2023   BUN 60 (H) 12/11/2023   CO2 21 (L) 12/11/2023   TSH 1.086 12/07/2023   INR 1.6 (A) 12/24/2023   HGBA1C 4.8 01/02/2023       Assessment & Plan:  Atrial fibrillation, unspecified type [facility]) Assessment & Plan: Back on coumadin  now. Being followed - coumadin  clinic and cardiology. Currently rate controlled. Planning for watchman procedure.    CKD (chronic kidney disease), stage IV (HCC) Assessment & Plan: Currently being evaluated for renal transplant. Has f/u with nephrology Monday 12/29/23. Hold on drawing labs today. She reports they will draw lab Monday. Continue to avoid antiinflammatory medication.   Orders: -     Basic metabolic panel with GFR; Future  Hypercholesterolemia Assessment & Plan: Continue Lipitor.  Low-cholesterol diet.  Follow lipid panel liver function testing. No changes today.  Lab Results  Component Value Date   CHOL 164 01/02/2023   HDL 83.70 01/02/2023   LDLCALC 68 01/02/2023   LDLDIRECT 53.3 10/14/2012   TRIG 64.0 01/02/2023   CHOLHDL 2 01/02/2023     Orders: -     Lipid panel; Future -     Hepatic function panel; Future  Hypothyroidism, unspecified type Assessment & Plan: Continues on synthroid . Follow tsh.   Orders: -     TSH; Future  Heart failure with preserved ejection fraction, unspecified HF chronicity (HCC) Assessment & Plan: ECHO 11/27/23 - EF 55 - 60%. LVH. Denies increased sob. Overall stable. Continue hydralazine  and demadex .    Anemia, unspecified type Assessment & Plan: Continues f/u with Dr Randy Buttery. Recent check hgb 13.6 and platelet count 155 (12/16/23).    Chronic diastolic CHF  (congestive heart failure) (HCC) Assessment & Plan: Continue farxiga .  Weigh daily.  On toresemide. ECHO 11/27/23 - EF 55-60% with LVH.  Unable to take entrestro - due to hyperkalemia.  Follow metabolic panel.  Feeling better. Breathing better. Continue current medication regimen.    Pulmonary emphysema, unspecified emphysema type (HCC) Assessment & Plan: No increased cough or congestion. Breathing stable. Follow.    Depression, major, single episode, mild (HCC) Assessment & Plan: Continue Effexor  and Wellbutrin .  Has good support. Overall she feels she is handling things relatively well. Follow.    Essential hypertension Assessment & Plan: Continues on hydralazine , Imdur  and torsemide  as directed.  Also on Farxiga .  Blood pressure as outlined.  Follow pressures.  Will have labs drawn at nephrology f/u next week.    Mixed hyperlipidemia Assessment & Plan: Continue lipitor. Follow lipid panel and liver function tests.    Idiopathic thrombocytopenic purpura (ITP) (HCC) Assessment & Plan: Received decadron  in the hospital. Had f/u with Dr Randy Buttery 12/16/23 - hgb wnl. Platelet count 155.  Continue f/u with hematology.       Dellar Fenton, MD

## 2023-12-28 ENCOUNTER — Encounter: Payer: Self-pay | Admitting: Internal Medicine

## 2023-12-28 NOTE — Assessment & Plan Note (Signed)
 Continue Lipitor.  Low-cholesterol diet.  Follow lipid panel liver function testing. No changes today.  Lab Results  Component Value Date   CHOL 164 01/02/2023   HDL 83.70 01/02/2023   LDLCALC 68 01/02/2023   LDLDIRECT 53.3 10/14/2012   TRIG 64.0 01/02/2023   CHOLHDL 2 01/02/2023

## 2023-12-28 NOTE — Assessment & Plan Note (Signed)
 Continues on synthroid. Follow tsh.

## 2023-12-28 NOTE — Assessment & Plan Note (Signed)
Continue lipitor.  Follow lipid panel and liver function tests.   

## 2023-12-28 NOTE — Assessment & Plan Note (Addendum)
 ECHO 11/27/23 - EF 55 - 60%. LVH. Denies increased sob. Overall stable. Continue hydralazine  and demadex .

## 2023-12-28 NOTE — Assessment & Plan Note (Signed)
 Continue farxiga .  Weigh daily.  On toresemide. ECHO 11/27/23 - EF 55-60% with LVH.  Unable to take entrestro - due to hyperkalemia.  Follow metabolic panel.  Feeling better. Breathing better. Continue current medication regimen.

## 2023-12-28 NOTE — Assessment & Plan Note (Signed)
 Received decadron  in the hospital. Had f/u with Dr Randy Buttery 12/16/23 - hgb wnl. Platelet count 155.  Continue f/u with hematology.

## 2023-12-28 NOTE — Assessment & Plan Note (Signed)
 Continue Effexor  and Wellbutrin .  Has good support. Overall she feels she is handling things relatively well. Follow.

## 2023-12-28 NOTE — Assessment & Plan Note (Signed)
 Currently being evaluated for renal transplant. Has f/u with nephrology Monday 12/29/23. Hold on drawing labs today. She reports they will draw lab Monday. Continue to avoid antiinflammatory medication.

## 2023-12-28 NOTE — Assessment & Plan Note (Signed)
 Continues on hydralazine , Imdur  and torsemide  as directed.  Also on Farxiga .  Blood pressure as outlined.  Follow pressures.  Will have labs drawn at nephrology f/u next week.

## 2023-12-28 NOTE — Assessment & Plan Note (Signed)
 No increased cough or congestion. Breathing stable. Follow.

## 2023-12-28 NOTE — Assessment & Plan Note (Signed)
 Continues f/u with Dr Randy Buttery. Recent check hgb 13.6 and platelet count 155 (12/16/23).

## 2023-12-28 NOTE — Assessment & Plan Note (Signed)
 Back on coumadin  now. Being followed - coumadin  clinic and cardiology. Currently rate controlled. Planning for watchman procedure.

## 2023-12-30 ENCOUNTER — Other Ambulatory Visit: Payer: Self-pay | Admitting: Internal Medicine

## 2023-12-31 ENCOUNTER — Telehealth: Payer: Self-pay

## 2023-12-31 ENCOUNTER — Ambulatory Visit (INDEPENDENT_AMBULATORY_CARE_PROVIDER_SITE_OTHER)

## 2023-12-31 ENCOUNTER — Other Ambulatory Visit: Payer: Self-pay

## 2023-12-31 ENCOUNTER — Ambulatory Visit: Attending: Cardiology | Admitting: Cardiology

## 2023-12-31 VITALS — BP 138/62 | HR 92 | Ht 65.0 in | Wt 118.6 lb

## 2023-12-31 DIAGNOSIS — I480 Paroxysmal atrial fibrillation: Secondary | ICD-10-CM

## 2023-12-31 DIAGNOSIS — I5032 Chronic diastolic (congestive) heart failure: Secondary | ICD-10-CM | POA: Diagnosis present

## 2023-12-31 DIAGNOSIS — D509 Iron deficiency anemia, unspecified: Secondary | ICD-10-CM | POA: Insufficient documentation

## 2023-12-31 DIAGNOSIS — Z7901 Long term (current) use of anticoagulants: Secondary | ICD-10-CM

## 2023-12-31 LAB — POCT INR: INR: 2.9 (ref 2.0–3.0)

## 2023-12-31 MED ORDER — CARVEDILOL 3.125 MG PO TABS: 3.1250 mg | ORAL_TABLET | Freq: Two times a day (BID) | ORAL | 3 refills | Status: DC

## 2023-12-31 NOTE — Patient Instructions (Signed)
 Continue taking Warfarin 1.5 tablets daily.  INR in 1 week.  [phone]

## 2023-12-31 NOTE — Patient Instructions (Signed)
 Medication Instructions:  START Coreg  3.125 mg twice daily   *If you need a refill on your cardiac medications before your next appointment, please call your pharmacy*  Follow-Up: At [facility], you and your health needs are our priority.  As part of our continuing mission to provide you with exceptional heart care, our providers are all part of one team.  This team includes your primary Cardiologist (physician) and Advanced Practice Providers or APPs (Physician Assistants and Nurse Practitioners) who all work together to provide you with the care you need, when you need it.  Your next appointment:   3 month(s)  Provider:   Harvie Liner, MD or Suzann Riddle, NP    We recommend signing up for the patient portal called "MyChart".  Sign up information is provided on this After Visit Summary.  MyChart is used to connect with patients for Virtual Visits (Telemedicine).  Patients are able to view lab/test results, encounter notes, upcoming appointments, etc.  Non-urgent messages can be sent to your provider as well.   To learn more about what you can do with MyChart, go to [email or web address].   Other Instructions EKG in 1 week.

## 2023-12-31 NOTE — Progress Notes (Signed)
 Electrophysiology Clinic Note    Date:  12/31/2023  Patient ID:  Alanna, Schoch 10/31/1955, MRN [id number] PCP:  Dellar Fenton, MD  Cardiologist:  Belva Boyden, MD HF Cardiologist: Peder Bourdon, MD Electrophysiologist: Boyce Byes, MD   Discussed the use of AI scribe software for clinical note transcription with the patient, who gave verbal consent to proceed.   Patient Profile    Chief Complaint: AFib ablation follow-up  History of Present Illness: DWAN GERACI is a 68 y.o. female with PMH notable for parox AFib, HFimpEF, COPD, MR, HTN, HLD, CKD-stage 4, hypothyroid, anemia; seen today for Boyce Byes, MD for routine electrophysiology followup.  Her previous HF was thought to be d/t tachy-mediated from AFib.  She is s/p AFib ablation w PVI, posterior wall, and CTI on 08/04/2023 by Dr. Marven Slimmer. I saw her for routine post-ablation appts, last on 10/2023 where she was maintaining sinus.   She presented to [facility] ER 4/13 with N/V/D x 10 days, with dizziness and ?syncope. AKI with Cr up to 4.29, supratherapeutic INR at > 10. EKG with HR in the 30s with 2:1 HB. Her coreg  and amiodarone  were held with significant improvement in her conduction. She was discharged with a live ambulatory monitor.  On follow-up today, she feels very well without cardiac complaints. Her INR earlier today was therapeutic. No further syncopal episodes and GI system appears to be functioning well. She denies palpations, chest pain, chest pressure. No palpitations or fluttering in chest, no syncope or presyncope.     AAD History: Amiodarone   - stopped 09/2023 d/t 2:1 HB    ROS:  Please see the history of present illness. All other systems are reviewed and otherwise negative.    Physical Exam    VS:  BP 138/62 (BP Location: Left Arm, Patient Position: Sitting, Cuff Size: Normal)   Pulse 92   Ht 5\' 5"  (1.651 m)   Wt 118 lb 9.6 oz (53.8 kg)   SpO2 92%   BMI 19.74 kg/m  BMI: Body mass  index is 19.74 kg/m.  Wt Readings from Last 3 Encounters:  12/31/23 118 lb 9.6 oz (53.8 kg)  12/26/23 118 lb 12.8 oz (53.9 kg)  12/24/23 123 lb (55.8 kg)     GEN- The patient is well appearing, alert and oriented x 3 today.   Lungs- Clear to ausculation bilaterally, normal work of breathing.  Heart- Regular rate and rhythm, no murmurs, rubs or gallops Extremities- No peripheral edema, warm, dry    Studies Reviewed   Previous EP, cardiology notes.    EKG is ordered. Personal review of EKG from today shows:    EKG Interpretation Date/Time:  Wednesday Dec 31 2023 13:02:58 EDT Ventricular Rate:  92 PR Interval:  290 QRS Duration:  90 QT Interval:  314 QTC Calculation: 388 R Axis:   55  Text Interpretation: Sinus rhythm with 1st degree A-V block Left ventricular hypertrophy with repolarization abnormality ( Cornell product ) Confirmed by Jimesha Rising [phone]) on 12/31/2023 1:08:06 PM     TTE, 11/27/2023  1. Left ventricular ejection fraction, by estimation, is 55 to 60%. The left ventricle has normal function. The left ventricle has no regional wall motion abnormalities. There is moderate left ventricular hypertrophy. Left ventricular diastolic parameters were normal.   2. Right ventricular systolic function is normal. The right ventricular size is normal. Tricuspid regurgitation signal is inadequate for assessing PA pressure.   3. Left atrial size was mildly dilated.  4. The mitral valve is normal in structure. Mild mitral valve regurgitation. No evidence of mitral stenosis.   5. The aortic valve is normal in structure. Aortic valve regurgitation is not visualized. No aortic stenosis is present.   6. The inferior vena cava is normal in size with greater than 50% respiratory variability, suggesting right atrial pressure of 3 mmHg.   Comparison(s): A prior study was performed on 11/13/2012. Changes from prior study are noted. The left ventricular function has improved.   TEE,  08/04/2023  1. Left ventricular ejection fraction, by estimation, is 50 to 55%. The left ventricle has low normal function.   2. Right ventricular systolic function is normal. The right ventricular size is normal.   3. Left atrial size was mildly dilated. No left atrial/left atrial appendage thrombus was detected.   4. The mitral valve is grossly normal. Mild mitral valve regurgitation. No evidence of mitral stenosis.   5. The aortic valve is normal in structure. Aortic valve regurgitation is trivial. No aortic stenosis is present.   6. There is mild (Grade II) plaque involving the descending aorta.   7. Continuous sedation prior to abalation procedure.   Cardiac MRI, 04/02/2023 1.  Normal LV size, mildly reduced LV systolic function.  LVEF 47%.  2.  There is no LGE or scar.  3.  No evidence for myocardial infiltration.  4.  Mildly elevated ECV value.  5.  Normal RV size and function.  6.  Findings suggest non ischemic cardiomyopathy.  TTE, 11/14/2022 1. Left ventricular ejection fraction, by estimation, is 25 to 30%. The left ventricle has severely decreased function. The left ventricle demonstrates global hypokinesis. There is moderate left ventricular hypertrophy. Left ventricular diastolic parameters are indeterminate.   2. Right ventricular systolic function is moderately reduced. The right ventricular size is normal. There is moderately elevated pulmonary artery systolic pressure. The estimated right ventricular systolic pressure is 45.7 mmHg.   3. Left atrial size was severely dilated.   4. A small pericardial effusion is present.   5. The mitral valve is normal in structure. Severe mitral valve regurgitation. No evidence of mitral stenosis.   6. Tricuspid valve regurgitation is moderate to severe.   7. The aortic valve is normal in structure. Aortic valve regurgitation is not visualized. No aortic stenosis is present.   8. The inferior vena cava is normal in size with greater than 50%  respiratory variability, suggesting right atrial pressure of 3 mmHg.    Assessment and Plan    #) 2:1 HB #) parox AFib Recently admitted with for N/V/D, found to be in 2:1 HB. BB and amiodarone  held with significant improvement in conduction. I suspect her arrhythmia was d/t AKI with drug accumulation.  With her history of tachy-induced HF and lack of AF awareness, needs BB Restart 3.125mg  coreg  BID Update EKG in 1 week   #) Hypercoag d/t parox afib #) anemia CHA2DS2-VASc Score = at least 5 [CHF History: 1, HTN History: 1, Diabetes History: 0, Stroke History: 0, Vascular Disease History: 1, Age Score: 1, Gender Score: 1].  Therefore, the patient's annual risk of stroke is 7.2 %.     Stroke ppx - warfarin, managed at [facility] office INR today therapeutic She requests to reschedule LAAO procedure, will  msg nurse coordinator to reschedule  #) HFimpEF Follows with HF team Appears euvolemic NICM thought d/t tachyarrhythmia, so need to maintain sinus rhythm GDMT includes farxiga  10mg  Hydral for BP Restart low dose coreg  as above  No Ace/arb/arni or spiro with CKD Continue torsemide  60/40 alternative days as per HF team     Current medicines are reviewed at length with the patient today.   The patient has concerns regarding her medicines.  The following changes were made today:   None   Labs/ tests ordered today include:  Orders Placed This Encounter  Procedures   EKG 12-Lead     Disposition: Follow up with Dr. Marven Slimmer or EP APP in 3 months, or based on usual follow-up for LAAO Continue weekly INR check at [facility] office  Due for HF follow-up, will facilitate appt   Signed, Yeilyn Gent, NP  12/31/23  1:46 PM  Electrophysiology [facility]    Addendum: Discussed asymptomatic Afib history with Dr. Marven Slimmer, who recommended ILR implant. Could be done at same time as LAAO. I called patient and discussed it. Reviewed risks/benefits, including monthly monitoring  fee. Patient verbalized understanding and wished to proceed.

## 2023-12-31 NOTE — Telephone Encounter (Signed)
 The patient was evaluated by Suzann Riddle today and was cleared to reschedule Watchman. The patient agreed to LAAO on 04/22/2024. She understood she will will need weekly INR checks prior to her procedure. She has a 3 month visit with Suzann on 04/12/2024 that will serve as pre-procedure check-up. She was grateful for call and agreed with plan.

## 2024-01-01 ENCOUNTER — Encounter: Payer: Self-pay | Admitting: Student in an Organized Health Care Education/Training Program

## 2024-01-01 DIAGNOSIS — R0602 Shortness of breath: Secondary | ICD-10-CM

## 2024-01-02 NOTE — Telephone Encounter (Signed)
Order has been sent to [employer].  Nothing further needed.

## 2024-01-06 ENCOUNTER — Telehealth: Payer: Self-pay | Admitting: Cardiovascular Disease

## 2024-01-06 NOTE — Telephone Encounter (Signed)
 Spoke with Danielle from [employer] who called to report the following additional findings on finial report.  Complete heart block on 12/20/22 @ 4:41 am for 7 sec HR 45-52 bmp  Nurse attempted to locate report and was advised report will be uploaded within 5-10 mins. Will await report for review.

## 2024-01-06 NOTE — Telephone Encounter (Signed)
 Dawn Sherman is calling from [employer] with critical monitor results and is requesting to speak with triage nurse. Call transferred.

## 2024-01-07 ENCOUNTER — Ambulatory Visit: Attending: Cardiovascular Disease

## 2024-01-07 ENCOUNTER — Ambulatory Visit

## 2024-01-07 DIAGNOSIS — Z7901 Long term (current) use of anticoagulants: Secondary | ICD-10-CM | POA: Insufficient documentation

## 2024-01-07 DIAGNOSIS — I480 Paroxysmal atrial fibrillation: Secondary | ICD-10-CM | POA: Diagnosis present

## 2024-01-07 LAB — POCT INR: INR: 1.7 — AB (ref 2.0–3.0)

## 2024-01-07 NOTE — Patient Instructions (Signed)
 Increase to 1.5 tablets daily, except 2 tablets every Wednesday.  INR in 3 weeks.  [phone]

## 2024-01-07 NOTE — Telephone Encounter (Signed)
 Report reviewed by Suzann Riddle, NP who recommended no changes.

## 2024-01-08 ENCOUNTER — Ambulatory Visit: Payer: Self-pay | Admitting: Cardiology

## 2024-01-08 NOTE — Addendum Note (Signed)
 Encounter addended by: Dudley Ghee, RN on: 01/08/2024 7:47 AM  Actions taken: Imaging Exam ended

## 2024-01-11 DIAGNOSIS — R001 Bradycardia, unspecified: Secondary | ICD-10-CM | POA: Diagnosis not present

## 2024-01-12 ENCOUNTER — Ambulatory Visit: Payer: Self-pay | Admitting: Internal Medicine

## 2024-01-12 ENCOUNTER — Ambulatory Visit (INDEPENDENT_AMBULATORY_CARE_PROVIDER_SITE_OTHER): Admitting: Student in an Organized Health Care Education/Training Program

## 2024-01-12 VITALS — BP 138/80 | HR 96 | Temp 97.6°F | Ht 65.0 in | Wt 114.6 lb

## 2024-01-12 DIAGNOSIS — R0602 Shortness of breath: Secondary | ICD-10-CM

## 2024-01-12 DIAGNOSIS — Z87891 Personal history of nicotine dependence: Secondary | ICD-10-CM

## 2024-01-12 DIAGNOSIS — Z720 Tobacco use: Secondary | ICD-10-CM

## 2024-01-12 NOTE — Progress Notes (Signed)
 Assessment & Plan:   1. Shortness of breath (Primary) 2. History of Tobacco Use  Patient presents for follow-up for minimal shortness of breath as well as previous nocturnal use of oxygen.  She had her overnight pulse oximetry study which did not show overnight hypoxia and oxygen therapy was subsequently discontinued. She has a history of heart failure with multiple admissions and is followed closely by cardiology. She recently underwent afib ablation and reports symptoms are well controlled. She also has a history of CKD for which she is being considered for transplant. Patient carries a diagnosis of presumed COPD, though no PFT's were performed.  Today, her lung exam is clear and she has no symptoms.  She was not able to get her PFTs which I will reorder today. Transplant evaluation notes reviewed and she will need the PFTs given her history of smoking to assess for COPD.  I will refer the patient again to our lung cancer screening program for low-dose CT for lung cancer screening.  - Pulmonary Function Test; Future - Ambulatory Referral for Lung Cancer Screening - Oxygen therapy discontinued   Return in about 1 year (around 01/11/2025).  I spent 32 minutes caring for this patient today, including preparing to see the patient, obtaining a medical history , reviewing a separately obtained history, performing a medically appropriate examination and/or evaluation, counseling and educating the patient/family/caregiver, ordering medications, tests, or procedures, and documenting clinical information in the electronic health record  Vergia Glasgow, MD [facility]  End of visit medications:  No orders of the defined types were placed in this encounter.    Current Outpatient Medications:    acetaminophen  (TYLENOL ) 325 MG tablet, Take 650 mg by mouth every 6 (six) hours as needed (pain.)., Disp: , Rfl:    atorvastatin  (LIPITOR) 40 MG tablet, TAKE ONE TABLET BY MOUTH  EVERY DAY, Disp: 90 tablet, Rfl: 0   buPROPion  (WELLBUTRIN  XL) 150 MG 24 hr tablet, TAKE ONE TABLET BY MOUTH EVERY DAY, Disp: 90 tablet, Rfl: 1   calcitRIOL  (ROCALTROL ) 0.25 MCG capsule, Take 0.25 mcg by mouth daily., Disp: , Rfl:    calcium  carbonate (OS-CAL - DOSED IN MG OF ELEMENTAL CALCIUM ) 1250 (500 Ca) MG tablet, Take 1 tablet (500 mg of elemental calcium  total) by mouth 3 (three) times daily with meals., Disp: 60 tablet, Rfl: 1   carvedilol  (COREG ) 3.125 MG tablet, Take 1 tablet (3.125 mg total) by mouth 2 (two) times daily., Disp: 180 tablet, Rfl: 3   FARXIGA  10 MG TABS tablet, Take 10 mg by mouth daily., Disp: , Rfl:    hydrALAZINE  (APRESOLINE ) 25 MG tablet, Take 1 tablet (25 mg total) by mouth 3 (three) times daily., Disp: 90 tablet, Rfl: 5   isosorbide  mononitrate (IMDUR ) 30 MG 24 hr tablet, Take 1 tablet (30 mg total) by mouth daily., Disp: 90 tablet, Rfl: 3   levothyroxine  (SYNTHROID ) 75 MCG tablet, TAKE ONE TABLET BY MOUTH EVERY DAY (STOP 137 MCG DOSE), Disp: 30 tablet, Rfl: 1   loratadine  (CLARITIN ) 10 MG tablet, Take 10 mg by mouth daily., Disp: , Rfl:    omeprazole  (PRILOSEC) 20 MG capsule, Take 1 capsule (20 mg total) by mouth daily., Disp: , Rfl:    torsemide  (DEMADEX ) 20 MG tablet, TAKE 60 MG ALTERNATING WITH 40 MG EVERY OTHER DAY, Disp: 270 tablet, Rfl: 3   venlafaxine  XR (EFFEXOR -XR) 150 MG 24 hr capsule, TAKE ONE CAPSULE BY MOUTH EVERY DAY, Disp: 90 capsule, Rfl: 1  Subjective:   PATIENT ID: Dawn Sherman: female DOB: [birth year], MRN: [id number]  Chief Complaint  Patient presents with   Follow-up    Shortness of breath on exertion.     HPI  Patient is a pleasant 68 year old female with a history of CKD presenting for follow-up.  Patient was last seen in clinic in March 2025 at which point PFTs and an overnight pulse oximetry study were ordered.  She has since had her overnight pulse oximetry which was normal and did not show any signs of hypoxia and  subsequently oxygen therapy was discontinued.  She was unable to get her PFTs nor was she reachable for lung cancer screening.  She was admitted in April to the hospital secondary to bradycardia in the setting of AKI and medication accumulation.  This has since resolved.  She continues to feel well and is at her baseline in terms of shortness of breath.  She does not have any other respiratory symptoms and only reports very minimal exertional dyspnea.  She does not have any cough, wheeze, chest tightness, sputum production, or chest pain.  She is not on any inhalers.   Patient is followed for CKD by Dr. Ancil Balzarine, and was referred to [facility] for a transplant evaluation. She has a history of Afib and HFrecEF followed closely by Dr. Mitzie Anda. [facility] and Hebert Littler, MD from nephrology). Given history of smoking and concern for COPD, as well as overnight oxygen use, she was referred to pulmonary for an evaluation, as oxygen use would preclude her transplant candidacy.    Patient reports a history of smoking, quit recently. She reports smoking up to half a pack a day for over 40 years. Likely with 20 to 30 pack years of smoking history .Denies any occupational exposures.   Medical record reviewed in length.  -She had a history of alcohol abuse, managed with PCP with report of abstinence as far back as 2020.  -She was admitted to the hospital in June of 2021 with shortness of breath and acute decompensated HFrEF (EF 30%) and afib, managed with GDMT and Apixaban .  -Repeat echo a few months later showed EF recovery.  -She was re-admitted 07/2021 with shortness of breath to the hospital with hypoxic respiratory failure felt to be secondary to a COPD exacerbation. She was discharged on oxygen (2L via [place]) with plan to follow up with PCP and pulmonary.  -She had a fall 11/2021 and admitted with finding of subarachnoid  hemorrhage, noted to be stable on repeat imaging and managed conservatively. Fall felt to be secondary to second degree heart block in the setting of beta blocker use (discontinued).  -She was re-admitted July of 2023 due to decompensated heart failure and treated with IV diuretics.  -Another admission noted November of 2023 for hypoxic respiratory failure secondary to decompensated heart failure and Afib with RVR. She was discharged on room air during said admission. -Again admitted with hypoxia and shortness of breath January of 2024, treated for decompensated heart failure, and discharged home on 3L via [place] -admit 09/2022 with hypoxia, shortness of breath, found to have a pleural effusion (right sided) s/p thoracentesis. -March/2024: admit with decompensated heart failure, required RHC x2 and aggressive diuresis with ionotropic support (milrinone ) -RHC 10/2022 RA 21, RV 62/24, PA 63/38 (46), PAOP 34, CO/CI 3.4/2, PVR 3.5 wood -RHC 11/2022 RA 6, RV 50/3, PA 50/17 (30),  PAOP 16, LVEDP 15, CO/CI 6.08/3.7. LHC normal coronaries. -07/2023: afib ablatio (pulmonary vein isolation, posterior wall ablation, cavotricuspid isthums ablation for flutter)  Ancillary information including prior medications, full medical/surgical/family/social histories, and PFTs (when available) are listed below and have been reviewed.   Review of Systems  Constitutional:  Negative for chills, fever, malaise/fatigue and weight loss.  Respiratory:  Negative for cough, hemoptysis, sputum production, shortness of breath and wheezing.   Cardiovascular:  Negative for chest pain.     Objective:   Vitals:   01/12/24 1046  BP: 138/80  Pulse: 96  Temp: 97.6 F (36.4 C)  TempSrc: Temporal  SpO2: 94%  Weight: 114 lb 9.6 oz (52 kg)  Height: 5\' 5"  (1.651 m)   94% on RA  BMI Readings from Last 3 Encounters:  01/12/24 19.07 kg/m  12/31/23 19.74 kg/m  12/26/23 19.77 kg/m   Wt Readings from Last 3 Encounters:  01/12/24 114  lb 9.6 oz (52 kg)  12/31/23 118 lb 9.6 oz (53.8 kg)  12/26/23 118 lb 12.8 oz (53.9 kg)    Physical Exam Constitutional:      Appearance: Normal appearance.  Cardiovascular:     Rate and Rhythm: Normal rate and regular rhythm.     Pulses: Normal pulses.     Heart sounds: Normal heart sounds.  Pulmonary:     Effort: Pulmonary effort is normal.     Breath sounds: Normal breath sounds. No wheezing.  Neurological:     General: No focal deficit present.     Mental Status: She is alert and oriented to person, place, and time. Mental status is at baseline.       Ancillary Information    Past Medical History:  Diagnosis Date   B12 deficiency    Cardiomyopathy (HCC)    a. 09/2022 Echo: EF 35-40%; b. 09/2022 MV: apical defect w/ mild peri-infarct ischemia. EF 43%.   Chronic HFrEF (heart failure with reduced ejection fraction) (HCC)    a. 01/2020 Echo: EF 30-35%; b. 07/2020 Echo: EF 50-55%; c. 06/2022 Echo: EF 50-55%; d. 09/2022 Echo: EF 35-40%, globh HK, mod reduced RV fxn, sev BAE, mod MR.   CKD (chronic kidney disease), stage IV (HCC)    COPD (chronic obstructive pulmonary disease) (HCC)    Depression    Endometriosis    Hypertension    Mixed hyperlipidemia    Moderate mitral regurgitation    Normocytic anemia    PAF (paroxysmal atrial fibrillation) (HCC)    a. CHA2DS2VASc = 4-->eliquis  2.5 bid.   Paroxymsal Atrial flutter (HCC)    Tobacco abuse      Family History  Problem Relation Age of Onset   Hypercholesterolemia Mother    Rheum arthritis Father    Rheum arthritis Daughter    Fibromyalgia Daughter    Breast cancer Neg Hx    Colon cancer Neg Hx      Past Surgical History:  Procedure Laterality Date   ABDOMINAL HYSTERECTOMY     ATRIAL FIBRILLATION ABLATION N/A 08/04/2023   Procedure: ATRIAL FIBRILLATION ABLATION;  Surgeon: Boyce Byes, MD;  Location: [facility] INVASIVE CV LAB;  Service: Cardiovascular;  Laterality: N/A;   BREAST BIOPSY Right 2015   neg-   FIBROADENOMA   CENTRAL LINE INSERTION  11/15/2022   Procedure: CENTRAL LINE INSERTION;  Surgeon: Sammy Crisp, MD;  Location: [facility] INVASIVE CV LAB;  Service: Cardiovascular;;   COLONOSCOPY WITH PROPOFOL  N/A 11/24/2022   Procedure: COLONOSCOPY WITH PROPOFOL ;  Surgeon: Shane Darling, MD;  Location:  [facility] ENDOSCOPY;  Service: Endoscopy;  Laterality: N/A;   COLONOSCOPY WITH PROPOFOL  N/A 11/23/2022   Procedure: COLONOSCOPY WITH PROPOFOL ;  Surgeon: Shane Darling, MD;  Location: [facility] ENDOSCOPY;  Service: Endoscopy;  Laterality: N/A;   ESOPHAGOGASTRODUODENOSCOPY (EGD) WITH PROPOFOL  N/A 11/23/2022   Procedure: ESOPHAGOGASTRODUODENOSCOPY (EGD) WITH PROPOFOL ;  Surgeon: Shane Darling, MD;  Location: [facility] ENDOSCOPY;  Service: Endoscopy;  Laterality: N/A;   RIGHT HEART CATH N/A 11/15/2022   Procedure: RIGHT HEART CATH;  Surgeon: Sammy Crisp, MD;  Location: [facility] INVASIVE CV LAB;  Service: Cardiovascular;  Laterality: N/A;   RIGHT/LEFT HEART CATH AND CORONARY ANGIOGRAPHY N/A 11/25/2022   Procedure: RIGHT/LEFT HEART CATH AND CORONARY ANGIOGRAPHY;  Surgeon: Wenona Hamilton, MD;  Location: [facility] INVASIVE CV LAB;  Service: Cardiovascular;  Laterality: N/A;   TAH/RSO  1999   secondary to bleeding and endometriosis (Dr Juliann Ochoa)   TRANSESOPHAGEAL ECHOCARDIOGRAM (CATH LAB) N/A 08/04/2023   Procedure: TRANSESOPHAGEAL ECHOCARDIOGRAM;  Surgeon: Boyce Byes, MD;  [facility] INVASIVE CV LAB;  Service: Cardiovascular;  Laterality: N/A;    Social History   Socioeconomic History   Marital status: Married    Spouse name: Not on file   Number of children: Not on file   Years of education: Not on file   Highest education level: 12th grade  Occupational History   Not on file  Tobacco Use   Smoking status: Former    Current packs/day: 0.25    Average packs/day: 0.3 packs/day for 40.0 years (10.0 ttl pk-yrs)    Types: Cigarettes   Smokeless tobacco: Never   Tobacco comments:    Patient  quit smoking recently \\Quit  6 months ago  Vaping Use   Vaping status: Never Used  Substance and Sexual Activity   Alcohol use: Not Currently    Alcohol/week: 0.0 standard drinks of alcohol    Comment: occasional   Drug use: No   Sexual activity: Not Currently  Other Topics Concern   Not on file  Social History Narrative   Lives in [place] with her husband.  She is retired from [employer]	[place].  She does not routinely exercise.   Social Drivers of Corporate investment banker Strain: Low Risk  (12/15/2023)   Overall Financial Resource Strain (CARDIA)    Difficulty of Paying Living Expenses: Not hard at all  Food Insecurity: No Food Insecurity (12/24/2023)   Hunger Vital Sign    Worried About Running Out of Food in the Last Year: Never true    Ran Out of Food in the Last Year: Never true  Transportation Needs: No Transportation Needs (12/24/2023)   PRAPARE - Administrator, Civil Service (Medical): No    Lack of Transportation (Non-Medical): No  Physical Activity: Inactive (12/15/2023)   Exercise Vital Sign    Days of Exercise per Week: 0 days    Minutes of Exercise per Session: 0 min  Stress: No Stress Concern Present (12/15/2023)   [employer] of Occupational Health - Occupational Stress Questionnaire    Feeling of Stress : Only a little  Social Connections: Socially Integrated (12/15/2023)   Social Connection and Isolation Panel [NHANES]    Frequency of Communication with Friends and Family: Three times a week    Frequency of Social Gatherings with Friends and Family: Three times a week    Attends Religious Services: 1 to 4 times per year    Active Member of Clubs or Organizations: Yes    Attends Banker Meetings:  1 to 4 times per year    Marital Status: Married  Catering manager Violence: Not At Risk (12/24/2023)   Humiliation, Afraid, Rape, and Kick questionnaire    Fear of Current or Ex-Partner: No    Emotionally Abused: No     Physically Abused: No    Sexually Abused: No     Allergies  Allergen Reactions   Sulfate Rash   Codeine Sulfate Nausea Only   Benicar [Olmesartan]     Talked with patient February 10, 2020, intolerance is unclear, tried several medications around that time and one of them gave her a rash but she is not clear which 1.   Amoxicillin Rash   Clindamycin/Lincomycin Rash   Entresto  [Sacubitril -Valsartan ] Other (See Comments)    hyperkalemia   Morphine And Codeine Rash   Penicillins Rash     CBC    Component Value Date/Time   WBC 12.1 (H) 12/16/2023 1453   RBC 4.49 12/16/2023 1453   HGB 13.6 12/16/2023 1453   HGB 13.6 04/15/2023 1210   HCT 41.7 12/16/2023 1453   HCT 41.4 04/15/2023 1210   PLT 155 12/16/2023 1453   PLT 129 (L) 04/15/2023 1210   MCV 92.9 12/16/2023 1453   MCV 95 04/15/2023 1210   MCH 30.3 12/16/2023 1453   MCHC 32.6 12/16/2023 1453   RDW 14.6 12/16/2023 1453   RDW 12.7 04/15/2023 1210   LYMPHSABS 0.4 (L) 12/11/2023 0504   MONOABS 0.9 12/11/2023 0504   EOSABS 0.0 12/11/2023 0504   BASOSABS 0.0 12/11/2023 0504    Pulmonary Functions Testing Results:     No data to display          Outpatient Medications Prior to Visit  Medication Sig Dispense Refill   acetaminophen  (TYLENOL ) 325 MG tablet Take 650 mg by mouth every 6 (six) hours as needed (pain.).     atorvastatin  (LIPITOR) 40 MG tablet TAKE ONE TABLET BY MOUTH EVERY DAY 90 tablet 0   buPROPion  (WELLBUTRIN  XL) 150 MG 24 hr tablet TAKE ONE TABLET BY MOUTH EVERY DAY 90 tablet 1   calcitRIOL  (ROCALTROL ) 0.25 MCG capsule Take 0.25 mcg by mouth daily.     calcium  carbonate (OS-CAL - DOSED IN MG OF ELEMENTAL CALCIUM ) 1250 (500 Ca) MG tablet Take 1 tablet (500 mg of elemental calcium  total) by mouth 3 (three) times daily with meals. 60 tablet 1   carvedilol  (COREG ) 3.125 MG tablet Take 1 tablet (3.125 mg total) by mouth 2 (two) times daily. 180 tablet 3   FARXIGA  10 MG TABS tablet Take 10 mg by mouth daily.      hydrALAZINE  (APRESOLINE ) 25 MG tablet Take 1 tablet (25 mg total) by mouth 3 (three) times daily. 90 tablet 5   isosorbide  mononitrate (IMDUR ) 30 MG 24 hr tablet Take 1 tablet (30 mg total) by mouth daily. 90 tablet 3   levothyroxine  (SYNTHROID ) 75 MCG tablet TAKE ONE TABLET BY MOUTH EVERY DAY (STOP 137 MCG DOSE) 30 tablet 1   loratadine  (CLARITIN ) 10 MG tablet Take 10 mg by mouth daily.     omeprazole  (PRILOSEC) 20 MG capsule Take 1 capsule (20 mg total) by mouth daily.     torsemide  (DEMADEX ) 20 MG tablet TAKE 60 MG ALTERNATING WITH 40 MG EVERY OTHER DAY 270 tablet 3   venlafaxine  XR (EFFEXOR -XR) 150 MG 24 hr capsule TAKE ONE CAPSULE BY MOUTH EVERY DAY 90 capsule 1   No facility-administered medications prior to visit.

## 2024-01-14 ENCOUNTER — Encounter: Payer: Self-pay | Admitting: Internal Medicine

## 2024-01-27 ENCOUNTER — Other Ambulatory Visit: Payer: Self-pay

## 2024-01-27 ENCOUNTER — Telehealth: Payer: Self-pay | Admitting: Acute Care

## 2024-01-27 DIAGNOSIS — Z122 Encounter for screening for malignant neoplasm of respiratory organs: Secondary | ICD-10-CM

## 2024-01-27 DIAGNOSIS — Z87891 Personal history of nicotine dependence: Secondary | ICD-10-CM

## 2024-01-27 NOTE — Patient Instructions (Signed)
 Visit Information  Thank you for taking time to visit with me today. Please don't hesitate to contact me if I can be of assistance to you before our next scheduled appointment.  Your next care management appointment is by telephone on 03/01/24 at 1 pm  Telephone follow-up in 1 month with Michele Ahle, RN case manager  Please call the care guide team at [phone] if you need to cancel, schedule, or reschedule an appointment.   Please call the Suicide and Crisis Lifeline: 988 call 1-800-273-TALK (toll free, 24 hour hotline) if you are experiencing a Mental Health or Behavioral Health Crisis or need someone to talk to.  Verba Girt RN, BSN, CCM [employer], Population Health Case Manager Phone: [phone]

## 2024-01-27 NOTE — Telephone Encounter (Signed)
 Lung Cancer Screening Narrative/Criteria Questionnaire (Cigarette Smokers Only- No Cigars/Pipes/vapes)   [employer]   SDMV:02/11/24 at 0930a/[place]                                           08-Apr-1956              LDCT: 02/12/24 at 0930a / [facility]    68 y.o.   Phone: [phone]  Lung Screening Narrative (confirm age 52-77 yrs Medicare / 50-80 yrs Private pay insurance)   Insurance information:Medicare B    Referring Provider:Dgayli   This screening involves an initial phone call with a team member from our program. It is called a shared decision making visit. The initial meeting is required by insurance and Medicare to make sure you understand the program. This appointment takes about 15-20 minutes to complete. The CT scan will completed at a separate date/time. This scan takes about 5-10 minutes to complete and you may eat and drink before and after the scan.  Criteria questions for Lung Cancer Screening:   Are you a current or former smoker? Former Age began smoking: 17y   If you are a former smoker, what year did you quit smoking? 2024   To calculate your smoking history, I need an accurate estimate of how many packs of cigarettes you smoked per day and for how many years. (Not just the number of PPD you are now smoking)   Years smoking 49 x Packs per day 1/2 = Pack years 25   (at least 20 pack yrs)   (Make sure they understand that we need to know how much they have smoked in the past, not just the number of PPD they are smoking now)  Do you have a personal history of cancer?  No    Do you have a family history of cancer? No  Are you coughing up blood?  No  Have you had unexplained weight loss of 15 lbs or more in the last 6 months? No  It looks like you meet all criteria.     Additional information: N/A

## 2024-01-27 NOTE — Patient Outreach (Signed)
 Complex Care Management   Visit Note  01/27/2024  Name:  Dawn Sherman MRN: [id number] DOB: October 28, 1955  Situation: Referral received for Complex Care Management related to Heart Failure and Atrial Fibrillation I obtained verbal consent from Patient.  Visit completed with patient  on the phone  Background:   Past Medical History:  Diagnosis Date   B12 deficiency    Cardiomyopathy (HCC)    a. 09/2022 Echo: EF 35-40%; b. 09/2022 MV: apical defect w/ mild peri-infarct ischemia. EF 43%.   Chronic HFrEF (heart failure with reduced ejection fraction) (HCC)    a. 01/2020 Echo: EF 30-35%; b. 07/2020 Echo: EF 50-55%; c. 06/2022 Echo: EF 50-55%; d. 09/2022 Echo: EF 35-40%, globh HK, mod reduced RV fxn, sev BAE, mod MR.   CKD (chronic kidney disease), stage IV (HCC)    COPD (chronic obstructive pulmonary disease) (HCC)    Depression    Endometriosis    Hypertension    Mixed hyperlipidemia    Moderate mitral regurgitation    Normocytic anemia    PAF (paroxysmal atrial fibrillation) (HCC)    a. CHA2DS2VASc = 4-->eliquis  2.5 bid.   Paroxymsal Atrial flutter (HCC)    Tobacco abuse     Assessment: Patient Reported Symptoms:  Cognitive Cognitive Status: Alert and oriented to person, place, and time, Insightful and able to interpret abstract concepts, Normal speech and language skills      Neurological Neurological Review of Symptoms: No symptoms reported    HEENT HEENT Symptoms Reported: No symptoms reported      Cardiovascular Cardiovascular Symptoms Reported: No symptoms reported Does patient have uncontrolled Hypertension?: No Cardiovascular Conditions: Heart failure, Hypertension (PAF) Cardiovascular Management Strategies: Routine screening, Medication therapy, Diet modification Cardiovascular Self-Management Outcome: 4 (good) Cardiovascular Comment: Patient states she has been doing very well. Denies having any  HF symptoms and atrial fibrillation symptoms.   Patient states her Frederik Jansky  has been scheduled for 04/22/24. She states her next follow up visit with her primary care provider is 02/19/24 and cardiovascular visit is 04/12/24.  Respiratory Respiratory Symptoms Reported: No symptoms reported Respiratory Conditions: COPD Respiratory Self-Management Outcome: 4 (good)  Endocrine Patient reports the following symptoms related to hypoglycemia or hyperglycemia : No symptoms reported Is patient diabetic?: No    Gastrointestinal Gastrointestinal Symptoms Reported: No symptoms reported      Genitourinary Genitourinary Symptoms Reported: No symptoms reported    Integumentary Integumentary Symptoms Reported: No symptoms reported    Musculoskeletal Musculoskelatal Symptoms Reviewed: No symptoms reported        Psychosocial Psychosocial Symptoms Reported: No symptoms reported            12/24/2023    4:58 PM  Depression screen PHQ 2/9  Decreased Interest 0  Down, Depressed, Hopeless 0  PHQ - 2 Score 0    There were no vitals filed for this visit.  Medications Reviewed Today     Reviewed by Lariza Cothron E, RN (Registered Nurse) on 01/27/24 at 1455  Med List Status: <None>   Medication Order Taking? Sig Documenting Provider Last Dose Status Informant  acetaminophen  (TYLENOL ) 325 MG tablet [id number] Yes Take 650 mg by mouth every 6 (six) hours as needed (pain.). [provider] Taking Active Self           Med Note Marvene Slipper Dec 07, 2023  3:43 PM) prn  atorvastatin  (LIPITOR) 40 MG tablet [id number] Yes TAKE ONE TABLET BY MOUTH EVERY DAY Dellar Fenton, MD Taking Active   buPROPion  (  WELLBUTRIN  XL) 150 MG 24 hr tablet [id number] Yes TAKE ONE TABLET BY MOUTH EVERY DAY Dellar Fenton, MD Taking Active Self  calcitRIOL  (ROCALTROL ) 0.25 MCG capsule [id number] Yes Take 0.25 mcg by mouth daily. [provider] Taking Active Self  calcium  carbonate (OS-CAL - DOSED IN MG OF ELEMENTAL CALCIUM ) 1250 (500 Ca) MG tablet [id number] Yes Take 1 tablet  (500 mg of elemental calcium  total) by mouth 3 (three) times daily with meals. Melvinia Stager, MD Taking Active Self  carvedilol  (COREG ) 3.125 MG tablet [id number] Yes Take 1 tablet (3.125 mg total) by mouth 2 (two) times daily. Riddle, Suzann, NP Taking Active   FARXIGA  10 MG TABS tablet [id number] Yes Take 10 mg by mouth daily. [provider] Taking Active Self  hydrALAZINE  (APRESOLINE ) 25 MG tablet [id number] Yes Take 1 tablet (25 mg total) by mouth 3 (three) times daily. Darlis Eisenmenger, MD Taking Active Self  isosorbide  mononitrate (IMDUR ) 30 MG 24 hr tablet [id number] Yes Take 1 tablet (30 mg total) by mouth daily. Darlis Eisenmenger, MD Taking Active Self  levothyroxine  (SYNTHROID ) 75 MCG tablet [id number] Yes TAKE ONE TABLET BY MOUTH EVERY DAY (STOP 137 MCG DOSE) Dellar Fenton, MD Taking Active Self  loratadine  (CLARITIN ) 10 MG tablet [id number] Yes Take 10 mg by mouth daily. [provider] Taking Active Self  omeprazole  (PRILOSEC) 20 MG capsule [id number] Yes Take 1 capsule (20 mg total) by mouth daily. Riddle, Suzann, NP Taking Active Self  torsemide  (DEMADEX ) 20 MG tablet [id number] Yes TAKE 60 MG ALTERNATING WITH 40 MG EVERY OTHER DAY Gollan, Timothy J, MD Taking Active Self           Med Note Andy Bannister, Jenine Mix Dec 07, 2023  3:42 PM) Patient states (40 mg ) two tablets every morning and (20 mg) one tablet in afternoon  venlafaxine  XR (EFFEXOR -XR) 150 MG 24 hr capsule [id number] Yes TAKE ONE CAPSULE BY MOUTH EVERY DAY Dellar Fenton, MD Taking Active Self  warfarin (COUMADIN ) 2.5 MG tablet [id number] Yes Take 2.5 mg by mouth daily. Patient takes 1 1/2 tablet on M,T,TH,F,Sat, Sun.  Takes 2 tablets on Wednesday. [provider] Taking Active Self            Recommendation:   PCP Follow-up  Follow Up Plan:   Telephone follow-up in 1 month. Patient transferring to [facility] RN for ongoing case management follow up.  Patient agreeable to transfer and  ongoing follow up.  Verba Girt RN, BSN, CCM [employer], Population Health Case Manager Phone: [phone]

## 2024-01-28 ENCOUNTER — Ambulatory Visit: Admitting: Cardiology

## 2024-01-28 ENCOUNTER — Ambulatory Visit: Attending: Cardiovascular Disease

## 2024-01-28 DIAGNOSIS — Z7901 Long term (current) use of anticoagulants: Secondary | ICD-10-CM | POA: Insufficient documentation

## 2024-01-28 DIAGNOSIS — I480 Paroxysmal atrial fibrillation: Secondary | ICD-10-CM | POA: Diagnosis present

## 2024-01-28 LAB — POCT INR: INR: 2.2 (ref 2.0–3.0)

## 2024-01-28 NOTE — Patient Instructions (Signed)
 Continue 1.5 tablets daily, except 2 tablets every Wednesday.  INR in 4 weeks.  [phone]

## 2024-01-30 ENCOUNTER — Ambulatory Visit: Payer: Medicare Other | Admitting: Cardiology

## 2024-02-03 ENCOUNTER — Encounter: Payer: Self-pay | Admitting: Student in an Organized Health Care Education/Training Program

## 2024-02-11 ENCOUNTER — Ambulatory Visit: Admitting: Acute Care

## 2024-02-11 DIAGNOSIS — Z87891 Personal history of nicotine dependence: Secondary | ICD-10-CM | POA: Diagnosis not present

## 2024-02-11 NOTE — Patient Instructions (Signed)

## 2024-02-11 NOTE — Progress Notes (Signed)
  Virtual Visit via Telephone Note  I connected with Furman Jobs on 02/11/24 at  9:30 AM EDT by telephone and verified that I am speaking with the correct person using two identifiers.  Location: Patient: Dawn Sherman Provider: Alyse Bach, RN   I discussed the limitations, risks, security and privacy concerns of performing an evaluation and management service by telephone and the availability of in person appointments. I also discussed with the patient that there may be a patient responsible charge related to this service. The patient expressed understanding and agreed to proceed.   Shared Decision Making Visit Lung Cancer Screening Program [phone])   Eligibility: Age 68 y.o. Pack Years Smoking History Calculation 25 (# packs/per year x # years smoked) Recent History of coughing up blood  no Unexplained weight loss? no ( >Than 15 pounds within the last 6 months ) Prior History Lung / other cancer no (Diagnosis within the last 5 years already requiring surveillance chest CT Scans). Smoking Status Former Smoker Former Smokers: Years since quit: 1 year  Quit Date: 2024  Visit Components: Discussion included one or more decision making aids. yes Discussion included risk/benefits of screening. yes Discussion included potential follow up diagnostic testing for abnormal scans. yes Discussion included meaning and risk of over diagnosis. yes Discussion included meaning and risk of False Positives. yes Discussion included meaning of total radiation exposure. yes  Counseling Included: Importance of adherence to annual lung cancer LDCT screening. yes Impact of comorbidities on ability to participate in the program. yes Ability and willingness to under diagnostic treatment. yes  Smoking Cessation Counseling: Current Smokers:  Discussed importance of smoking cessation. yes Information about tobacco cessation classes and interventions provided to patient. yes Patient provided with  ticket for LDCT Scan. no Symptomatic Patient. no  Counseling(Intermediate counseling: > three minutes) [id number] Diagnosis Code: Tobacco Use Z72.0 Asymptomatic Patient yes  Counseling (Intermediate counseling: > three minutes counseling) [id number] Former Smokers:  Discussed the importance of maintaining cigarette abstinence. yes Diagnosis Code: Personal History of Nicotine  Dependence. F64.332 Information about tobacco cessation classes and interventions provided to patient. Yes Patient provided with ticket for LDCT Scan. no Written Order for Lung Cancer Screening with LDCT placed in Epic. Yes (CT Chest Lung Cancer Screening Low Dose W/O CM) [id number] Z12.2-Screening of respiratory organs Z87.891-Personal history of nicotine  dependence   Alyse Bach, RN

## 2024-02-12 ENCOUNTER — Ambulatory Visit
Admission: RE | Admit: 2024-02-12 | Discharge: 2024-02-12 | Disposition: A | Discharge status: Home / Self Care | Source: Ambulatory Visit | Attending: Acute Care | Admitting: Acute Care

## 2024-02-12 ENCOUNTER — Telehealth: Payer: Self-pay | Admitting: Cardiology

## 2024-02-12 DIAGNOSIS — Z122 Encounter for screening for malignant neoplasm of respiratory organs: Secondary | ICD-10-CM | POA: Insufficient documentation

## 2024-02-12 DIAGNOSIS — Z87891 Personal history of nicotine dependence: Secondary | ICD-10-CM | POA: Diagnosis present

## 2024-02-12 NOTE — Telephone Encounter (Signed)
 Lvm to sched recall

## 2024-02-19 ENCOUNTER — Ambulatory Visit (INDEPENDENT_AMBULATORY_CARE_PROVIDER_SITE_OTHER): Payer: Medicare Other | Admitting: Internal Medicine

## 2024-02-19 VITALS — BP 118/74 | HR 80 | Temp 97.9°F | Resp 16 | Ht 65.0 in | Wt 114.4 lb

## 2024-02-19 DIAGNOSIS — I10 Essential (primary) hypertension: Secondary | ICD-10-CM

## 2024-02-19 DIAGNOSIS — F1021 Alcohol dependence, in remission: Secondary | ICD-10-CM

## 2024-02-19 DIAGNOSIS — D72829 Elevated white blood cell count, unspecified: Secondary | ICD-10-CM

## 2024-02-19 DIAGNOSIS — F320 Major depressive disorder, single episode, mild: Secondary | ICD-10-CM

## 2024-02-19 DIAGNOSIS — D649 Anemia, unspecified: Secondary | ICD-10-CM

## 2024-02-19 DIAGNOSIS — N184 Chronic kidney disease, stage 4 (severe): Secondary | ICD-10-CM

## 2024-02-19 DIAGNOSIS — I5020 Unspecified systolic (congestive) heart failure: Secondary | ICD-10-CM

## 2024-02-19 DIAGNOSIS — J439 Emphysema, unspecified: Secondary | ICD-10-CM

## 2024-02-19 DIAGNOSIS — E039 Hypothyroidism, unspecified: Secondary | ICD-10-CM

## 2024-02-19 DIAGNOSIS — E7800 Pure hypercholesterolemia, unspecified: Secondary | ICD-10-CM | POA: Diagnosis not present

## 2024-02-19 DIAGNOSIS — K219 Gastro-esophageal reflux disease without esophagitis: Secondary | ICD-10-CM

## 2024-02-19 DIAGNOSIS — I480 Paroxysmal atrial fibrillation: Secondary | ICD-10-CM

## 2024-02-19 LAB — LIPID PANEL
Cholesterol: 141 mg/dL (ref 0–200)
HDL: 53 mg/dL (ref >39.00)
LDL Cholesterol: 66 mg/dL (ref 0–99)
NonHDL: 88.03
Total CHOL/HDL Ratio: 3
Triglycerides: 111 mg/dL (ref 0.0–149.0)
VLDL: 22.2 mg/dL (ref 0.0–40.0)

## 2024-02-19 LAB — CBC WITH DIFFERENTIAL/PLATELET
Basophils Absolute: 0.1 10*3/uL (ref 0.0–0.1)
Basophils Relative: 0.8 % (ref 0.0–3.0)
Eosinophils Absolute: 0.1 10*3/uL (ref 0.0–0.7)
Eosinophils Relative: 1.8 % (ref 0.0–5.0)
HCT: 46.1 % — ABNORMAL HIGH (ref 36.0–46.0)
Hemoglobin: 15.1 g/dL — ABNORMAL HIGH (ref 12.0–15.0)
Lymphocytes Relative: 15.1 % (ref 12.0–46.0)
Lymphs Abs: 1 10*3/uL (ref 0.7–4.0)
MCHC: 32.7 g/dL (ref 30.0–36.0)
MCV: 94.5 fl (ref 78.0–100.0)
Monocytes Absolute: 0.8 10*3/uL (ref 0.1–1.0)
Monocytes Relative: 12.9 % — ABNORMAL HIGH (ref 3.0–12.0)
Neutro Abs: 4.4 10*3/uL (ref 1.4–7.7)
Neutrophils Relative %: 69.4 % (ref 43.0–77.0)
Platelets: 94 10*3/uL — ABNORMAL LOW (ref 150.0–400.0)
RBC: 4.88 Mil/uL (ref 3.87–5.11)
RDW: 14.8 % (ref 11.5–15.5)
WBC: 6.4 10*3/uL (ref 4.0–10.5)

## 2024-02-19 LAB — HEPATIC FUNCTION PANEL
ALT: 13 U/L (ref 0–35)
AST: 15 U/L (ref 0–37)
Albumin: 4.4 g/dL (ref 3.5–5.2)
Alkaline Phosphatase: 89 U/L (ref 39–117)
Bilirubin, Direct: 0.1 mg/dL (ref 0.0–0.3)
Total Bilirubin: 0.4 mg/dL (ref 0.2–1.2)
Total Protein: 6.5 g/dL (ref 6.0–8.3)

## 2024-02-19 LAB — BASIC METABOLIC PANEL WITH GFR
BUN: 41 mg/dL — ABNORMAL HIGH (ref 6–23)
CO2: 28 meq/L (ref 19–32)
Calcium: 9.3 mg/dL (ref 8.4–10.5)
Chloride: 102 meq/L (ref 96–112)
Creatinine, Ser: 2.66 mg/dL — ABNORMAL HIGH (ref 0.40–1.20)
GFR: 17.87 mL/min — ABNORMAL LOW (ref >60.00)
Glucose, Bld: 94 mg/dL (ref 70–99)
Potassium: 5.2 meq/L — ABNORMAL HIGH (ref 3.5–5.1)
Sodium: 138 meq/L (ref 135–145)

## 2024-02-19 LAB — TSH: TSH: 1.38 u[IU]/mL (ref 0.35–5.50)

## 2024-02-19 MED ORDER — VENLAFAXINE HCL ER 150 MG PO CP24: 150.0000 mg | ORAL_CAPSULE | Freq: Every day | ORAL | 1 refills | Status: DC

## 2024-02-19 MED ORDER — ALBUTEROL SULFATE HFA 108 (90 BASE) MCG/ACT IN AERS: 2.0000 | INHALATION_SPRAY | Freq: Four times a day (QID) | RESPIRATORY_TRACT | 0 refills | Status: AC | PRN

## 2024-02-19 MED ORDER — ATORVASTATIN CALCIUM 40 MG PO TABS: 40.0000 mg | ORAL_TABLET | Freq: Every day | ORAL | 0 refills | Status: DC

## 2024-02-19 MED ORDER — LEVOTHYROXINE SODIUM 75 MCG PO TABS: 75.0000 ug | ORAL_TABLET | Freq: Every day | ORAL | 1 refills | Status: DC

## 2024-02-19 MED ORDER — BUPROPION HCL ER (XL) 150 MG PO TB24: 150.0000 mg | ORAL_TABLET | Freq: Every day | ORAL | 1 refills | Status: DC

## 2024-02-19 NOTE — Progress Notes (Signed)
 Subjective:    Patient ID: Dawn Sherman, female    DOB: 1956/07/22, 68 y.o.   MRN: [id number]  Patient here for  Chief Complaint  Patient presents with   Medical Management of Chronic Issues    HPI Here for a scheduled follow up. Hospitalized 12/07/23 - 12/11/23. Presented with low heart rate and acute on chronic renal failure. PTA had 10 days of nausea, vomiting and diarrhea.  Given IVFs and zofran . Cardiology and hematology consulted. Pulse improved to normal range after adjusting medication. Platelet count increased after decadron  administration. Had f/u with hematology 12/16/23 - hgb 13.6. platelet count - 155. Restarted back on coumadin . Is being followed - coumadin  clinic.  visit 6/18 - planning for screening chest CT. Had f/u with pulmonary 01/12/24 - had her overnight pulse oximetry study which did not show overnight hypoxia and oxygen therapy was subsequently discontinued. Planning for PFTs. Follow up with cardiology 12/31/23 - recommended to restart carvedilol . Continue coumadin . Continue torsemide  and farxoga. She reports she is doing relatively well. Feels better. Reports breathing is better. Denies increased sob. Does report some dripping - nose. Some cough. Clear mucus. No fever.    Past Medical History:  Diagnosis Date   B12 deficiency    Cardiomyopathy (HCC)    a. 09/2022 Echo: EF 35-40%; b. 09/2022 MV: apical defect w/ mild peri-infarct ischemia. EF 43%.   Chronic HFrEF (heart failure with reduced ejection fraction) (HCC)    a. 01/2020 Echo: EF 30-35%; b. 07/2020 Echo: EF 50-55%; c. 06/2022 Echo: EF 50-55%; d. 09/2022 Echo: EF 35-40%, globh HK, mod reduced RV fxn, sev BAE, mod MR.   CKD (chronic kidney disease), stage IV (HCC)    COPD (chronic obstructive pulmonary disease) (HCC)    Depression    Endometriosis    Hypertension    Mixed hyperlipidemia    Moderate mitral regurgitation    Normocytic anemia    PAF (paroxysmal atrial fibrillation) (HCC)    a. CHA2DS2VASc =  4-->eliquis  2.5 bid.   Paroxymsal Atrial flutter (HCC)    Tobacco abuse    Past Surgical History:  Procedure Laterality Date   ABDOMINAL HYSTERECTOMY     ATRIAL FIBRILLATION ABLATION N/A 08/04/2023   Procedure: ATRIAL FIBRILLATION ABLATION;  Surgeon: Cindie Ole DASEN, MD;  Location: [facility] INVASIVE CV LAB;  Service: Cardiovascular;  Laterality: N/A;   BREAST BIOPSY Right 2015   neg-  FIBROADENOMA   CENTRAL LINE INSERTION  11/15/2022   Procedure: CENTRAL LINE INSERTION;  Surgeon: Mady Bruckner, MD;  Location: [facility] INVASIVE CV LAB;  Service: Cardiovascular;;   COLONOSCOPY WITH PROPOFOL  N/A 11/24/2022   Procedure: COLONOSCOPY WITH PROPOFOL ;  Surgeon: Maryruth Ole DASEN, MD;  Location: [facility] ENDOSCOPY;  Service: Endoscopy;  Laterality: N/A;   COLONOSCOPY WITH PROPOFOL  N/A 11/23/2022   Procedure: COLONOSCOPY WITH PROPOFOL ;  Surgeon: Maryruth Ole DASEN, MD;  Location: [facility] ENDOSCOPY;  Service: Endoscopy;  Laterality: N/A;   ESOPHAGOGASTRODUODENOSCOPY (EGD) WITH PROPOFOL  N/A 11/23/2022   Procedure: ESOPHAGOGASTRODUODENOSCOPY (EGD) WITH PROPOFOL ;  Surgeon: Maryruth Ole DASEN, MD;  Location: [facility] ENDOSCOPY;  Service: Endoscopy;  Laterality: N/A;   RIGHT HEART CATH N/A 11/15/2022   Procedure: RIGHT HEART CATH;  Surgeon: Mady Bruckner, MD;  Location: [facility] INVASIVE CV LAB;  Service: Cardiovascular;  Laterality: N/A;   RIGHT/LEFT HEART CATH AND CORONARY ANGIOGRAPHY N/A 11/25/2022   Procedure: RIGHT/LEFT HEART CATH AND CORONARY ANGIOGRAPHY;  Surgeon: Darron Deatrice LABOR, MD;  Location: [facility] INVASIVE CV LAB;  Service: Cardiovascular;  Laterality: N/A;   TAH/RSO  1999   secondary to bleeding and endometriosis (Dr Trula)   TRANSESOPHAGEAL ECHOCARDIOGRAM (CATH LAB) N/A 08/04/2023   Procedure: TRANSESOPHAGEAL ECHOCARDIOGRAM;  Surgeon: Cindie Ole DASEN, MD;  Location: [facility] INVASIVE CV LAB;  Service: Cardiovascular;  Laterality: N/A;   Family History  Problem Relation Age of Onset   Hypercholesterolemia  Mother    Rheum arthritis Father    Rheum arthritis Daughter    Fibromyalgia Daughter    Breast cancer Neg Hx    Colon cancer Neg Hx    Social History   Socioeconomic History   Marital status: Married    Spouse name: Not on file   Number of children: Not on file   Years of education: Not on file   Highest education level: 12th grade  Occupational History   Not on file  Tobacco Use   Smoking status: Former    Current packs/day: 0.00    Average packs/day: 0.5 packs/day for 49.0 years (24.5 ttl pk-yrs)    Types: Cigarettes    Start date: 57    Quit date: 2024    Years since quitting: 1.4   Smokeless tobacco: Never   Tobacco comments:    Patient quit smoking recently \\Quit  6 months ago  Vaping Use   Vaping status: Never Used  Substance and Sexual Activity   Alcohol use: Not Currently    Alcohol/week: 0.0 standard drinks of alcohol    Comment: occasional   Drug use: No   Sexual activity: Not Currently  Other Topics Concern   Not on file  Social History Narrative   Lives in [place] with her husband.  She is retired from [employer]	[place].  She does not routinely exercise.   Social Drivers of Corporate investment banker Strain: Low Risk  (12/15/2023)   Overall Financial Resource Strain (CARDIA)    Difficulty of Paying Living Expenses: Not hard at all  Food Insecurity: No Food Insecurity (12/24/2023)   Hunger Vital Sign    Worried About Running Out of Food in the Last Year: Never true    Ran Out of Food in the Last Year: Never true  Transportation Needs: No Transportation Needs (12/24/2023)   PRAPARE - Administrator, Civil Service (Medical): No    Lack of Transportation (Non-Medical): No  Physical Activity: Inactive (12/15/2023)   Exercise Vital Sign    Days of Exercise per Week: 0 days    Minutes of Exercise per Session: 0 min  Stress: No Stress Concern Present (12/15/2023)   [employer] of Occupational Health - Occupational Stress  Questionnaire    Feeling of Stress : Only a little  Social Connections: Socially Integrated (12/15/2023)   Social Connection and Isolation Panel    Frequency of Communication with Friends and Family: Three times a week    Frequency of Social Gatherings with Friends and Family: Three times a week    Attends Religious Services: 1 to 4 times per year    Active Member of Clubs or Organizations: Yes    Attends Banker Meetings: 1 to 4 times per year    Marital Status: Married     Review of Systems  Constitutional:  Negative for appetite change and fever.  HENT:  Positive for congestion and postnasal drip.   Respiratory:  Positive for cough. Negative for chest tightness and shortness of breath.   Cardiovascular:  Negative for chest pain, palpitations and leg swelling.  Gastrointestinal:  Negative for abdominal pain, diarrhea, nausea and vomiting.  Genitourinary:  Negative for difficulty urinating and dysuria.  Musculoskeletal:  Negative for joint swelling and myalgias.  Skin:  Negative for color change and rash.  Neurological:  Negative for dizziness and headaches.  Psychiatric/Behavioral:  Negative for agitation and dysphoric mood.        Objective:     BP 118/74   Pulse 80   Temp 97.9 F (36.6 C)   Resp 16   Ht 5' 5 (1.651 m)   Wt 114 lb 6.4 oz (51.9 kg)   SpO2 96%   BMI 19.04 kg/m  Wt Readings from Last 3 Encounters:  02/19/24 114 lb 6.4 oz (51.9 kg)  01/12/24 114 lb 9.6 oz (52 kg)  12/31/23 118 lb 9.6 oz (53.8 kg)    Physical Exam Vitals reviewed.  Constitutional:      General: She is not in acute distress.    Appearance: Normal appearance.  HENT:     Head: Normocephalic and atraumatic.     Right Ear: External ear normal.     Left Ear: External ear normal.   Eyes:     General: No scleral icterus.       Right eye: No discharge.        Left eye: No discharge.     Conjunctiva/sclera: Conjunctivae normal.   Neck:     Thyroid : No thyromegaly.    Cardiovascular:     Rate and Rhythm: Normal rate and regular rhythm.  Pulmonary:     Effort: No respiratory distress.     Comments: No increased wheezing. Increased air movement.  Abdominal:     General: Bowel sounds are normal.     Palpations: Abdomen is soft.     Tenderness: There is no abdominal tenderness.   Musculoskeletal:        General: No swelling or tenderness.     Cervical back: Neck supple. No tenderness.  Lymphadenopathy:     Cervical: No cervical adenopathy.   Skin:    Findings: No erythema or rash.   Neurological:     Mental Status: She is alert.   Psychiatric:        Mood and Affect: Mood normal.        Behavior: Behavior normal.         Outpatient Encounter Medications as of 02/19/2024  Medication Sig   acetaminophen  (TYLENOL ) 325 MG tablet Take 650 mg by mouth every 6 (six) hours as needed (pain.).   albuterol  (VENTOLIN  HFA) 108 (90 Base) MCG/ACT inhaler Inhale 2 puffs into the lungs every 6 (six) hours as needed for wheezing or shortness of breath.   calcitRIOL  (ROCALTROL ) 0.25 MCG capsule Take 0.25 mcg by mouth daily.   calcium  carbonate (OS-CAL - DOSED IN MG OF ELEMENTAL CALCIUM ) 1250 (500 Ca) MG tablet Take 1 tablet (500 mg of elemental calcium  total) by mouth 3 (three) times daily with meals.   carvedilol  (COREG ) 3.125 MG tablet Take 1 tablet (3.125 mg total) by mouth 2 (two) times daily.   FARXIGA  10 MG TABS tablet Take 10 mg by mouth daily.   hydrALAZINE  (APRESOLINE ) 25 MG tablet Take 1 tablet (25 mg total) by mouth 3 (three) times daily.   isosorbide  mononitrate (IMDUR ) 30 MG 24 hr tablet Take 1 tablet (30 mg total) by mouth daily.   loratadine  (CLARITIN ) 10 MG tablet Take 10 mg by mouth daily.   omeprazole  (PRILOSEC) 20 MG capsule Take 1 capsule (20 mg total) by mouth daily.   torsemide  (DEMADEX ) 20 MG tablet TAKE 60  MG ALTERNATING WITH 40 MG EVERY OTHER DAY   warfarin (COUMADIN ) 2.5 MG tablet Take 2.5 mg by mouth daily. Patient takes 1 1/2  tablet on M,T,TH,F,Sat, Sun.  Takes 2 tablets on Wednesday.   atorvastatin  (LIPITOR) 40 MG tablet Take 1 tablet (40 mg total) by mouth daily.   buPROPion  (WELLBUTRIN  XL) 150 MG 24 hr tablet Take 1 tablet (150 mg total) by mouth daily.   levothyroxine  (SYNTHROID ) 75 MCG tablet Take 1 tablet (75 mcg total) by mouth daily before breakfast.   venlafaxine  XR (EFFEXOR -XR) 150 MG 24 hr capsule Take 1 capsule (150 mg total) by mouth daily.   [DISCONTINUED] atorvastatin  (LIPITOR) 40 MG tablet TAKE ONE TABLET BY MOUTH EVERY DAY   [DISCONTINUED] buPROPion  (WELLBUTRIN  XL) 150 MG 24 hr tablet TAKE ONE TABLET BY MOUTH EVERY DAY   [DISCONTINUED] levothyroxine  (SYNTHROID ) 75 MCG tablet TAKE ONE TABLET BY MOUTH EVERY DAY (STOP 137 MCG DOSE)   [DISCONTINUED] venlafaxine  XR (EFFEXOR -XR) 150 MG 24 hr capsule TAKE ONE CAPSULE BY MOUTH EVERY DAY   No facility-administered encounter medications on file as of 02/19/2024.     Lab Results  Component Value Date   WBC 6.4 02/19/2024   HGB 15.1 (H) 02/19/2024   HCT 46.1 (H) 02/19/2024   PLT 94.0 (L) 02/19/2024   GLUCOSE 94 02/19/2024   CHOL 141 02/19/2024   TRIG 111.0 02/19/2024   HDL 53.00 02/19/2024   LDLDIRECT 53.3 10/14/2012   LDLCALC 66 02/19/2024   ALT 13 02/19/2024   AST 15 02/19/2024   NA 138 02/19/2024   K 5.2 No hemolysis seen (H) 02/19/2024   CL 102 02/19/2024   CREATININE 2.66 (H) 02/19/2024   BUN 41 (H) 02/19/2024   CO2 28 02/19/2024   TSH 1.38 02/19/2024   INR 2.2 01/28/2024   HGBA1C 4.8 01/02/2023    No results found.     Assessment & Plan:  Hypercholesterolemia Assessment & Plan: Continue Lipitor.  Low-cholesterol diet.  Follow lipid panel liver function testing. No changes today.  Lab Results  Component Value Date   CHOL 141 02/19/2024   HDL 53.00 02/19/2024   LDLCALC 66 02/19/2024   LDLDIRECT 53.3 10/14/2012   TRIG 111.0 02/19/2024   CHOLHDL 3 02/19/2024     Orders: -     Hepatic function panel -     Lipid  panel  Primary hypertension Assessment & Plan: Continues on coreg , imdur  and demadex . Also on farxiga . Blood pressure as outlined. No changes today. Follow pressures. Follow metabolic panel.    Leukocytosis, unspecified type -     CBC with Differential/Platelet  CKD (chronic kidney disease), stage IV (HCC) Assessment & Plan: Currently being evaluated for renal transplant.  Continue to avoid antiinflammatory medication. Follow metabolic panel.   Orders: -     Basic metabolic panel with GFR  Hypothyroidism, unspecified type Assessment & Plan: Continue synthroid . Follow tsh.   Orders: -     TSH  Paroxysmal atrial fibrillation [facility]) Assessment & Plan: Left heart catheterization 11/25/2022 revealed normal coronary arteries, moderate pulmonary hypertension.  Is status post A-fib ablation.  Evaluated by cardiology 09/04/2023 and recommendation was to continue on amiodarone  and Coreg .  Eliquis  initially changed to Xarelto  secondary to cost.  Overall currently feels things are stable.  Denies any chest pain. Eliquis  and xarelto  costly - changed to warfarin. Followed by cardiology and coumadin  clinic. Continues on torsemide  and farxiga  as well as carvedilol . Off amiodarone  now. Breathing stable. Weight stable. Follow.    HFrEF (heart  failure with reduced ejection fraction) (HCC) Assessment & Plan: Left heart catheterization with normal coronaries as outlined.  No evidence of volume overload on exam.  Continues on Farxiga , imdur , torsemide  and coreg . Follow volume status. Follow metabolic panel.    Gastroesophageal reflux disease without esophagitis Assessment & Plan: No upper symptoms reported cotninue prilosec.    Depression, major, single episode, mild (HCC) Assessment & Plan: Continue effexor  and wellbutrin . Overall stable. Follow    Pulmonary emphysema, unspecified emphysema type (HCC) Assessment & Plan: Some increased congestion as outlined. Drainage and some cough. Denies sob.  Treat with robitussin and nasal spray as directed. Albuterol  inhaler prn. Follow closely. Call with udpate.    Anemia, unspecified type Assessment & Plan: Continues f/u with Dr Melanee. Recent check hgb 13.6 and platelet count 155 (12/16/23).    Alcohol dependence in remission [facility]) Assessment & Plan: No recent alcohol intake. Follow.    Other orders -     Atorvastatin  Calcium ; Take 1 tablet (40 mg total) by mouth daily.  Dispense: 90 tablet; Refill: 0 -     buPROPion  HCl ER (XL); Take 1 tablet (150 mg total) by mouth daily.  Dispense: 90 tablet; Refill: 1 -     Levothyroxine  Sodium; Take 1 tablet (75 mcg total) by mouth daily before breakfast.  Dispense: 30 tablet; Refill: 1 -     Venlafaxine  HCl ER; Take 1 capsule (150 mg total) by mouth daily.  Dispense: 90 capsule; Refill: 1 -     Albuterol  Sulfate HFA; Inhale 2 puffs into the lungs every 6 (six) hours as needed for wheezing or shortness of breath.  Dispense: 18 g; Refill: 0     Allena Hamilton, MD

## 2024-02-19 NOTE — Patient Instructions (Addendum)
 Robitussin - for cough and congestion.   Flonase nasal spray (or nasacort nasal spray) - 2 sprays each nostril one time per day.

## 2024-02-20 ENCOUNTER — Ambulatory Visit: Payer: Self-pay | Admitting: Internal Medicine

## 2024-02-22 ENCOUNTER — Encounter: Payer: Self-pay | Admitting: Internal Medicine

## 2024-02-22 DIAGNOSIS — F1020 Alcohol dependence, uncomplicated: Secondary | ICD-10-CM | POA: Insufficient documentation

## 2024-02-22 NOTE — Assessment & Plan Note (Signed)
 No recent alcohol intake. Follow.

## 2024-02-22 NOTE — Assessment & Plan Note (Signed)
 No upper symptoms reported cotninue prilosec.

## 2024-02-22 NOTE — Assessment & Plan Note (Signed)
 Continues on coreg , imdur  and demadex . Also on farxiga . Blood pressure as outlined. No changes today. Follow pressures. Follow metabolic panel.

## 2024-02-22 NOTE — Assessment & Plan Note (Signed)
 Left heart catheterization 11/25/2022 revealed normal coronary arteries, moderate pulmonary hypertension.  Is status post A-fib ablation.  Evaluated by cardiology 09/04/2023 and recommendation was to continue on amiodarone  and Coreg .  Eliquis  initially changed to Xarelto  secondary to cost.  Overall currently feels things are stable.  Denies any chest pain. Eliquis  and xarelto  costly - changed to warfarin. Followed by cardiology and coumadin  clinic. Continues on torsemide  and farxiga  as well as carvedilol . Off amiodarone  now. Breathing stable. Weight stable. Follow.

## 2024-02-22 NOTE — Assessment & Plan Note (Signed)
 Continue synthroid. Follow tsh.

## 2024-02-22 NOTE — Assessment & Plan Note (Signed)
 Currently being evaluated for renal transplant.  Continue to avoid antiinflammatory medication. Follow metabolic panel.

## 2024-02-22 NOTE — Assessment & Plan Note (Signed)
 Some increased congestion as outlined. Drainage and some cough. Denies sob. Treat with robitussin and nasal spray as directed. Albuterol  inhaler prn. Follow closely. Call with udpate.

## 2024-02-22 NOTE — Assessment & Plan Note (Signed)
 Continue effexor  and wellbutrin . Overall stable. Follow

## 2024-02-22 NOTE — Assessment & Plan Note (Signed)
 Left heart catheterization with normal coronaries as outlined.  No evidence of volume overload on exam.  Continues on Farxiga , imdur , torsemide  and coreg . Follow volume status. Follow metabolic panel.

## 2024-02-22 NOTE — Assessment & Plan Note (Signed)
 Continues f/u with Dr Randy Buttery. Recent check hgb 13.6 and platelet count 155 (12/16/23).

## 2024-02-22 NOTE — Assessment & Plan Note (Signed)
 Continue Lipitor.  Low-cholesterol diet.  Follow lipid panel liver function testing. No changes today.  Lab Results  Component Value Date   CHOL 141 02/19/2024   HDL 53.00 02/19/2024   LDLCALC 66 02/19/2024   LDLDIRECT 53.3 10/14/2012   TRIG 111.0 02/19/2024   CHOLHDL 3 02/19/2024

## 2024-02-23 ENCOUNTER — Other Ambulatory Visit: Payer: Self-pay

## 2024-02-23 ENCOUNTER — Inpatient Hospital Stay
Admission: EM | Admit: 2024-02-23 | Discharge: 2024-03-01 | DRG: 189 | Disposition: A | Discharge status: Home / Self Care | Attending: Internal Medicine | Admitting: Internal Medicine

## 2024-02-23 ENCOUNTER — Emergency Department

## 2024-02-23 ENCOUNTER — Telehealth: Payer: Self-pay

## 2024-02-23 DIAGNOSIS — Z7901 Long term (current) use of anticoagulants: Secondary | ICD-10-CM

## 2024-02-23 DIAGNOSIS — I2489 Other forms of acute ischemic heart disease: Secondary | ICD-10-CM | POA: Diagnosis present

## 2024-02-23 DIAGNOSIS — I130 Hypertensive heart and chronic kidney disease with heart failure and stage 1 through stage 4 chronic kidney disease, or unspecified chronic kidney disease: Secondary | ICD-10-CM | POA: Diagnosis present

## 2024-02-23 DIAGNOSIS — F419 Anxiety disorder, unspecified: Secondary | ICD-10-CM | POA: Diagnosis present

## 2024-02-23 DIAGNOSIS — J441 Chronic obstructive pulmonary disease with (acute) exacerbation: Principal | ICD-10-CM | POA: Diagnosis present

## 2024-02-23 DIAGNOSIS — E039 Hypothyroidism, unspecified: Secondary | ICD-10-CM | POA: Diagnosis present

## 2024-02-23 DIAGNOSIS — F32A Depression, unspecified: Secondary | ICD-10-CM | POA: Diagnosis present

## 2024-02-23 DIAGNOSIS — D693 Immune thrombocytopenic purpura: Secondary | ICD-10-CM | POA: Diagnosis present

## 2024-02-23 DIAGNOSIS — I4819 Other persistent atrial fibrillation: Secondary | ICD-10-CM | POA: Diagnosis present

## 2024-02-23 DIAGNOSIS — F418 Other specified anxiety disorders: Secondary | ICD-10-CM | POA: Diagnosis present

## 2024-02-23 DIAGNOSIS — Z87891 Personal history of nicotine dependence: Secondary | ICD-10-CM

## 2024-02-23 DIAGNOSIS — I5A Non-ischemic myocardial injury (non-traumatic): Secondary | ICD-10-CM | POA: Diagnosis not present

## 2024-02-23 DIAGNOSIS — Z79890 Hormone replacement therapy (postmenopausal): Secondary | ICD-10-CM

## 2024-02-23 DIAGNOSIS — J9621 Acute and chronic respiratory failure with hypoxia: Principal | ICD-10-CM | POA: Diagnosis present

## 2024-02-23 DIAGNOSIS — E785 Hyperlipidemia, unspecified: Secondary | ICD-10-CM | POA: Diagnosis present

## 2024-02-23 DIAGNOSIS — I429 Cardiomyopathy, unspecified: Secondary | ICD-10-CM | POA: Diagnosis present

## 2024-02-23 DIAGNOSIS — I5032 Chronic diastolic (congestive) heart failure: Secondary | ICD-10-CM | POA: Diagnosis present

## 2024-02-23 DIAGNOSIS — I10 Essential (primary) hypertension: Secondary | ICD-10-CM | POA: Diagnosis not present

## 2024-02-23 DIAGNOSIS — E782 Mixed hyperlipidemia: Secondary | ICD-10-CM | POA: Diagnosis present

## 2024-02-23 DIAGNOSIS — B9789 Other viral agents as the cause of diseases classified elsewhere: Secondary | ICD-10-CM | POA: Diagnosis present

## 2024-02-23 DIAGNOSIS — N184 Chronic kidney disease, stage 4 (severe): Secondary | ICD-10-CM | POA: Diagnosis present

## 2024-02-23 DIAGNOSIS — Z1152 Encounter for screening for COVID-19: Secondary | ICD-10-CM

## 2024-02-23 LAB — COMPREHENSIVE METABOLIC PANEL WITH GFR
ALT: 13 U/L (ref 0–44)
AST: 16 U/L (ref 15–41)
Albumin: 3.6 g/dL (ref 3.5–5.0)
Alkaline Phosphatase: 84 U/L (ref 38–126)
Anion gap: 13 (ref 5–15)
BUN: 42 mg/dL — ABNORMAL HIGH (ref 8–23)
CO2: 25 mmol/L (ref 22–32)
Calcium: 8.9 mg/dL (ref 8.9–10.3)
Chloride: 100 mmol/L (ref 98–111)
Creatinine, Ser: 2.77 mg/dL — ABNORMAL HIGH (ref 0.44–1.00)
GFR, Estimated: 18 mL/min — ABNORMAL LOW (ref >60)
Glucose, Bld: 108 mg/dL — ABNORMAL HIGH (ref 70–99)
Potassium: 5 mmol/L (ref 3.5–5.1)
Sodium: 138 mmol/L (ref 135–145)
Total Bilirubin: 0.8 mg/dL (ref 0.0–1.2)
Total Protein: 6.4 g/dL — ABNORMAL LOW (ref 6.5–8.1)

## 2024-02-23 LAB — RESP PANEL BY RT-PCR (RSV, FLU A&B, COVID)  RVPGX2
Influenza A by PCR: NEGATIVE
Influenza B by PCR: NEGATIVE
Resp Syncytial Virus by PCR: NEGATIVE
SARS Coronavirus 2 by RT PCR: NEGATIVE

## 2024-02-23 LAB — CBC
HCT: 46.2 % — ABNORMAL HIGH (ref 36.0–46.0)
Hemoglobin: 15.1 g/dL — ABNORMAL HIGH (ref 12.0–15.0)
MCH: 31.1 pg (ref 26.0–34.0)
MCHC: 32.7 g/dL (ref 30.0–36.0)
MCV: 95.1 fL (ref 80.0–100.0)
Platelets: 124 10*3/uL — ABNORMAL LOW (ref 150–400)
RBC: 4.86 MIL/uL (ref 3.87–5.11)
RDW: 13.6 % (ref 11.5–15.5)
WBC: 16.1 10*3/uL — ABNORMAL HIGH (ref 4.0–10.5)
nRBC: 0 % (ref 0.0–0.2)

## 2024-02-23 LAB — BLOOD GAS, VENOUS
Acid-Base Excess: 0.7 mmol/L (ref 0.0–2.0)
Bicarbonate: 25.4 mmol/L (ref 20.0–28.0)
O2 Saturation: 90 %
Patient temperature: 37
pCO2, Ven: 40 mmHg — ABNORMAL LOW (ref 44–60)
pH, Ven: 7.41 (ref 7.25–7.43)
pO2, Ven: 60 mmHg — ABNORMAL HIGH (ref 32–45)

## 2024-02-23 LAB — PROTIME-INR
INR: 4 — ABNORMAL HIGH (ref 0.8–1.2)
Prothrombin Time: 40.4 s — ABNORMAL HIGH (ref 11.4–15.2)

## 2024-02-23 LAB — BRAIN NATRIURETIC PEPTIDE: B Natriuretic Peptide: 335.4 pg/mL — ABNORMAL HIGH (ref 0.0–100.0)

## 2024-02-23 LAB — TROPONIN I (HIGH SENSITIVITY)
Troponin I (High Sensitivity): 25 ng/L — ABNORMAL HIGH (ref <18)
Troponin I (High Sensitivity): 26 ng/L — ABNORMAL HIGH (ref <18)

## 2024-02-23 MED ORDER — DAPAGLIFLOZIN PROPANEDIOL 10 MG PO TABS
10.0000 mg | ORAL_TABLET | Freq: Every day | ORAL | Status: DC
  Administered 2024-02-24: 10 mg via ORAL
  Filled 2024-02-23: qty 1

## 2024-02-23 MED ORDER — WARFARIN - PHARMACIST DOSING INPATIENT
Freq: Every day | Status: DC
  Administered 2024-02-28: 1
  Filled 2024-02-23: qty 1

## 2024-02-23 MED ORDER — CARVEDILOL 3.125 MG PO TABS
3.1250 mg | ORAL_TABLET | Freq: Two times a day (BID) | ORAL | Status: DC
Start: 2024-02-23 — End: 2024-03-01
  Administered 2024-02-23 – 2024-02-29 (×13): 3.125 mg via ORAL
  Filled 2024-02-23 (×13): qty 1

## 2024-02-23 MED ORDER — IPRATROPIUM-ALBUTEROL 0.5-2.5 (3) MG/3ML IN SOLN
3.0000 mL | Freq: Once | RESPIRATORY_TRACT | Status: AC
  Administered 2024-02-23: 3 mL via RESPIRATORY_TRACT
  Filled 2024-02-23: qty 3

## 2024-02-23 MED ORDER — HYDRALAZINE HCL 25 MG PO TABS
25.0000 mg | ORAL_TABLET | Freq: Three times a day (TID) | ORAL | Status: DC
Start: 2024-02-23 — End: 2024-03-01
  Administered 2024-02-23 – 2024-02-29 (×19): 25 mg via ORAL
  Filled 2024-02-23 (×19): qty 1

## 2024-02-23 MED ORDER — LEVOTHYROXINE SODIUM 50 MCG PO TABS
75.0000 ug | ORAL_TABLET | Freq: Every day | ORAL | Status: DC
  Administered 2024-02-24 – 2024-03-01 (×7): 75 ug via ORAL
  Filled 2024-02-23 (×7): qty 1

## 2024-02-23 MED ORDER — VENLAFAXINE HCL ER 75 MG PO CP24
150.0000 mg | ORAL_CAPSULE | Freq: Every day | ORAL | Status: DC
  Administered 2024-02-24 – 2024-02-29 (×6): 150 mg via ORAL
  Filled 2024-02-23 (×2): qty 2
  Filled 2024-02-23: qty 1
  Filled 2024-02-23 (×3): qty 2

## 2024-02-23 MED ORDER — TORSEMIDE 20 MG PO TABS
40.0000 mg | ORAL_TABLET | Freq: Every day | ORAL | Status: DC
  Administered 2024-02-24 – 2024-02-25 (×2): 40 mg via ORAL
  Filled 2024-02-23 (×2): qty 2

## 2024-02-23 MED ORDER — CALCIUM CARBONATE 1250 (500 CA) MG PO TABS
1.0000 | ORAL_TABLET | Freq: Three times a day (TID) | ORAL | Status: DC
  Administered 2024-02-24 – 2024-02-29 (×18): 1250 mg via ORAL
  Filled 2024-02-23 (×20): qty 1

## 2024-02-23 MED ORDER — IPRATROPIUM-ALBUTEROL 0.5-2.5 (3) MG/3ML IN SOLN
3.0000 mL | RESPIRATORY_TRACT | Status: DC
  Administered 2024-02-23 – 2024-02-25 (×7): 3 mL via RESPIRATORY_TRACT
  Filled 2024-02-23 (×7): qty 3

## 2024-02-23 MED ORDER — ATORVASTATIN CALCIUM 20 MG PO TABS
40.0000 mg | ORAL_TABLET | Freq: Every day | ORAL | Status: DC
  Administered 2024-02-24 – 2024-02-29 (×6): 40 mg via ORAL
  Filled 2024-02-23 (×7): qty 2

## 2024-02-23 MED ORDER — ALBUTEROL SULFATE (2.5 MG/3ML) 0.083% IN NEBU: 3.0000 mL | INHALATION_SOLUTION | RESPIRATORY_TRACT | Status: DC | PRN

## 2024-02-23 MED ORDER — METHYLPREDNISOLONE SODIUM SUCC 125 MG IJ SOLR
80.0000 mg | Freq: Every day | INTRAMUSCULAR | Status: DC
  Administered 2024-02-24 – 2024-02-29 (×6): 80 mg via INTRAVENOUS
  Filled 2024-02-23 (×6): qty 2

## 2024-02-23 MED ORDER — ISOSORBIDE MONONITRATE ER 30 MG PO TB24
30.0000 mg | ORAL_TABLET | Freq: Every day | ORAL | Status: DC
  Administered 2024-02-24 – 2024-02-29 (×6): 30 mg via ORAL
  Filled 2024-02-23 (×6): qty 1

## 2024-02-23 MED ORDER — CALCITRIOL 0.25 MCG PO CAPS
0.2500 ug | ORAL_CAPSULE | Freq: Every day | ORAL | Status: DC
  Administered 2024-02-24 – 2024-02-29 (×6): 0.25 ug via ORAL
  Filled 2024-02-23 (×7): qty 1

## 2024-02-23 MED ORDER — DOXYCYCLINE HYCLATE 100 MG PO TABS
100.0000 mg | ORAL_TABLET | Freq: Two times a day (BID) | ORAL | Status: AC
  Administered 2024-02-23 – 2024-02-28 (×10): 100 mg via ORAL
  Filled 2024-02-23 (×10): qty 1

## 2024-02-23 MED ORDER — BUPROPION HCL ER (XL) 150 MG PO TB24
150.0000 mg | ORAL_TABLET | Freq: Every day | ORAL | Status: DC
  Administered 2024-02-24 – 2024-02-29 (×6): 150 mg via ORAL
  Filled 2024-02-23 (×6): qty 1

## 2024-02-23 MED ORDER — LORATADINE 10 MG PO TABS
10.0000 mg | ORAL_TABLET | Freq: Every day | ORAL | Status: DC
  Administered 2024-02-23 – 2024-02-29 (×7): 10 mg via ORAL
  Filled 2024-02-23 (×6): qty 1

## 2024-02-23 MED ORDER — DM-GUAIFENESIN ER 30-600 MG PO TB12
1.0000 | ORAL_TABLET | Freq: Two times a day (BID) | ORAL | Status: DC | PRN
  Administered 2024-02-24 – 2024-02-29 (×8): 1 via ORAL
  Filled 2024-02-23 (×10): qty 1

## 2024-02-23 MED ORDER — TORSEMIDE 20 MG PO TABS
20.0000 mg | ORAL_TABLET | Freq: Every day | ORAL | Status: DC
  Administered 2024-02-24: 20 mg via ORAL
  Filled 2024-02-23: qty 1

## 2024-02-23 MED ORDER — PANTOPRAZOLE SODIUM 40 MG PO TBEC
40.0000 mg | DELAYED_RELEASE_TABLET | Freq: Every day | ORAL | Status: DC
  Administered 2024-02-24 – 2024-02-29 (×6): 40 mg via ORAL
  Filled 2024-02-23 (×6): qty 1

## 2024-02-23 NOTE — ED Provider Notes (Signed)
 [facility] Provider Note    Event Date/Time   First MD Initiated Contact with Patient 02/23/24 1714     (approximate)  History   Chief Complaint: Shortness of Breath  HPI  Dawn Sherman is a 68 y.o. female with a past medical history of hypertension, COPD, CKD, CHF, paroxysmal atrial fibrillation on Eliquis , presents to the emergency department for worsening shortness of breath.  According to the patient over the past few days she has had worsening shortness of breath.  Patient states previously she was prescribed oxygen to be used at home but she has been since taken off of that and no longer has oxygen to use at home.  EMS was called out to the patient's residence for shortness of breath.  Placed the patient on O2 gave 2 DuoNebs and 1 albuterol  nebulizer treatment 125 mg Solu-Medrol  on the way to the hospital.  Upon arrival patient states she is feeling somewhat better but continues to have shortness of breath.  Patient currently satting 90% on 6 L nasal cannula.  Patient states she has had a worsening cough with occasional sputum production.  No chest pain.  No fever.  Physical Exam   Triage Vital Signs: ED Triage Vitals  Encounter Vitals Group     BP --      Girls Systolic BP Percentile --      Girls Diastolic BP Percentile --      Boys Systolic BP Percentile --      Boys Diastolic BP Percentile --      Pulse Rate 02/23/24 1722 (!) 48     Resp 02/23/24 1722 (!) 24     Temp 02/23/24 1722 98.2 F (36.8 C)     Temp Source 02/23/24 1722 Oral     SpO2 02/23/24 1722 90 %     Weight 02/23/24 1712 114 lb 3.2 oz (51.8 kg)     Height 02/23/24 1712 5' 5 (1.651 m)     Head Circumference --      Peak Flow --      Pain Score 02/23/24 1712 0     Pain Loc --      Pain Education --      Exclude from Growth Chart --     Most recent vital signs: Vitals:   02/23/24 1722  Pulse: (!) 48  Resp: (!) 24  Temp: 98.2 F (36.8 C)  SpO2: 90%    General: Awake, no  distress.  CV:  Good peripheral perfusion.  Regular rate and rhythm  Resp:  Slightly increased respiratory effort, mild tachypnea equal breath sounds bilaterally.  Mild wheeze and rhonchi bilaterally. Abd:  No distention.  Soft, nontender.  No rebound or guarding. Other:  No lower extremity edema   ED Results / Procedures / Treatments   EKG  EKG reviewed and interpreted by myself shows sinus bradycardia 48 bpm with a narrow QRS, normal axis, PR prolongation otherwise normal intervals.  Nonspecific ST changes.  No significant change in her ST segments from prior EKG 01/07/2024.  RADIOLOGY  I have reviewed and interpreted the chest x-ray images.  Significant scoliosis, no obvious opacity. Radiology has read the x-ray as chronic hyperinflation with thickening and no acute findings.   MEDICATIONS ORDERED IN ED: Medications  ipratropium-albuterol  (DUONEB) 0.5-2.5 (3) MG/3ML nebulizer solution 3 mL (has no administration in time range)     IMPRESSION / MDM / ASSESSMENT AND PLAN / ED COURSE  I reviewed the triage vital signs and  the nursing notes.  Patient's presentation is most consistent with acute presentation with potential threat to life or bodily function.  Patient presents to the emergency department for worsening shortness of breath of the last few days with worsening cough occasional sputum production.  Patient requiring 6 L to maintain sats in the 90s currently with no baseline O2 requirement.  Patient has mild rhonchi in wheeze bilaterally with diminished air movement bilaterally.  Will dose an additional DuoNeb in the emergency department.  Will check labs including cardiac enzymes BNP and obtain a chest x-ray to evaluate for pneumonia pneumothorax or other acute abnormality.  Patient has received Solu-Medrol  and breathing treatments prior to arrival highly suspect COPD exacerbation given her clinical presentation.  Patient's chest x-ray is negative for acute abnormality.   Chemistry is resulted showing renal insufficiency largely unchanged from historical values.  Patient has leukocytosis of 16,000 on her CBC otherwise reassuring.  Troponin slightly elevated at 25 we will repeat a troponin as a precaution although this is largely unchanged from historical values.  BNP is 335 although elevated this is down from recent values.  Patient continues to require 6 L of oxygen to maintain sats around 90%.  Will admit to the hospital service for further workup and treatment.  FINAL CLINICAL IMPRESSION(S) / ED DIAGNOSES   COPD exacerbation   Note:  This document was prepared using Dragon voice recognition software and may include unintentional dictation errors.   Dorothyann Drivers, MD 02/23/24 1943

## 2024-02-23 NOTE — ED Triage Notes (Signed)
 Pt presents to the ED via [facility] from home for [facility]. Pt has had SHOB and a cough x1 week that has significantly worsened over the last 3 days. Pt reports a productive cough of green sputum. Pt has a hx of COPD and used to be on oxygen, but was since taken off and is RA at baseline.   HR 54 20g Rt FA 125mg  solu-medrol  2 duonebs 1 albuterol  96% on 2L 87% on RA

## 2024-02-23 NOTE — Telephone Encounter (Signed)
 Called patient back, husband answered the phone. He says that patients breathing got worse after last phone call. She had a coughing fit and could not catch her breath. O2 was 84% so husband called EMS. She is going to be transported to the hospital.

## 2024-02-23 NOTE — Progress Notes (Addendum)
 PHARMACY - ANTICOAGULATION CONSULT NOTE  Pharmacy Consult for warfarin Indication: atrial fibrillation  Allergies  Allergen Reactions   Sulfate Rash   Codeine Sulfate Nausea Only   Benicar [Olmesartan]     Talked with patient February 10, 2020, intolerance is unclear, tried several medications around that time and one of them gave her a rash but she is not clear which 1.   Amoxicillin Rash   Clindamycin/Lincomycin Rash   Entresto  [Sacubitril -Valsartan ] Other (See Comments)    hyperkalemia   Morphine And Codeine Rash   Penicillins Rash    Patient Measurements: Height: 5' 5 (165.1 cm) Weight: 51.8 kg (114 lb 3.2 oz) IBW/kg (Calculated) : 57 HEPARIN  DW (KG): 51.8  Vital Signs: Temp: 98.2 F (36.8 C) (06/30 1722) Temp Source: Oral (06/30 1722) BP: 140/79 (06/30 2000) Pulse Rate: 66 (06/30 2000)  Labs: Recent Labs    02/23/24 1720 02/23/24 1917  HGB 15.1*  --   HCT 46.2*  --   PLT 124*  --   CREATININE 2.77*  --   TROPONINIHS 25* 26*    Estimated Creatinine Clearance: 15.9 mL/min (A) (by C-G formula based on SCr of 2.77 mg/dL (H)).   Medical History: Past Medical History:  Diagnosis Date   B12 deficiency    Cardiomyopathy (HCC)    a. 09/2022 Echo: EF 35-40%; b. 09/2022 MV: apical defect w/ mild peri-infarct ischemia. EF 43%.   Chronic HFrEF (heart failure with reduced ejection fraction) (HCC)    a. 01/2020 Echo: EF 30-35%; b. 07/2020 Echo: EF 50-55%; c. 06/2022 Echo: EF 50-55%; d. 09/2022 Echo: EF 35-40%, globh HK, mod reduced RV fxn, sev BAE, mod MR.   CKD (chronic kidney disease), stage IV (HCC)    COPD (chronic obstructive pulmonary disease) (HCC)    Depression    Endometriosis    Hypertension    Mixed hyperlipidemia    Moderate mitral regurgitation    Normocytic anemia    PAF (paroxysmal atrial fibrillation) (HCC)    a. CHA2DS2VASc = 4-->eliquis  2.5 bid.   Paroxymsal Atrial flutter (HCC)    Tobacco abuse    Assessment: 68 y/o female presenting with  progressively worsening SOB. PMH significant for paroxysmal A-fib s/p ablation, COPD, HTN, HLD, depression, and CKD IV. Pharmacy has been consulted to resume and manage warfarin while inpatient.   Home warfarin regimen: 5 mg on Wednesdays, 3.75 mg all other days (total weekly dose of 27.5 mg) Last warfarin dose prior to admission: 3.375 mg on 02/23/24   Baseline labs: INR pending, hgb 15.1, plt 124  Goal of Therapy:  INR 2-3 Monitor platelets by anticoagulation protocol: Yes   Plan:  Patient reports taking warfarin dose prior to presenting to ED this evening INR is supra-therapeutic at 4.0 on admission - per RN, no signs/symptoms of bleeding noted Will follow-up INR in AM Monitor daily INR while admitted, CBC at least weekly  Thank you for involving pharmacy in this patient's care.   Damien Napoleon, PharmD Clinical Pharmacist 02/23/2024 10:01 PM

## 2024-02-23 NOTE — Telephone Encounter (Signed)
 Was evaluated end of last week and patient was having sinus symptoms. She has been taking mucinex , flonase, inhaler as directed by you. She is coughing, congested. No fever, chills, body aches, nausea. No increased SOB from her baseline. No one else is sick in her household. Symptoms started Tuesday of last week. She says she was told to call in if no better and she says nothing she has tried is working. Can we send something in? Her pharmacy is [facility].

## 2024-02-23 NOTE — H&P (Signed)
 History and Physical    Dawn Sherman FMW:[id number] DOB: [birth year] DOA: 02/23/2024  Referring MD/NP/PA:   PCP: Glendia Shad, MD   Patient coming from:  The patient is coming from home.     Chief Complaint: SOB  HPI: Dawn Sherman is a 68 y.o. female with medical history significant of  COPD, HTN, HLD, GERD, depression, former smoker, alcohol abuse in remission, CKD-IV,  dCHF (used to have sCHF with EF 30-35%, improved to EF 50-60 by 2D Echo on 11/27/23), SDH, PAF on Coumadin , second-degree AV block, presents with SOB.   Patient states that she has progressively worsening SOB for more than 1 week.  She has cough with greenish colored sputum production.  No chest pain, fever or chills.  No nausea, vomiting, diarrhea or abdominal pain.  No symptoms of UTI.   Patient states previously she was prescribed oxygen to be used at home but she has been since taken off of that and no longer has oxygen to use at home recently. Patient was found to have moderate respiratory distress, oxygen desaturation to 84-87% on room air, cannot speak in full sentence, 6 L oxygen was started, with 91-92% of saturation.  Patient was given 125 mg of Solu-Medrol .  Data reviewed independently and ED Course: pt was found to have WBC 16.1, BNP 335, renal function close to baseline, troponin 25 -> 26, temperature normal, blood pressure 143/53, heart rate 46- > 96, RR 24- > 22.  Chest x-ray showed bronchitis change without infiltration.  Patient is admitted to PCU as inpatient.   EKG: I have personally reviewed.  Sinus rhythm, QTc 354, T wave inversion in inferior leads and V5-V6 which is similar to previous EKG on 01/07/2024.   Review of Systems:   General: no fevers, chills, no body weight gain, has fatigue HEENT: no blurry vision, hearing changes or sore throat Respiratory: has dyspnea, coughing, wheezing CV: no chest pain, no palpitations GI: no nausea, vomiting, abdominal pain, diarrhea, constipation GU: no  dysuria, burning on urination, increased urinary frequency, hematuria  Ext: no leg edema Neuro: no unilateral weakness, numbness, or tingling, no vision change or hearing loss Skin: no rash, no skin tear. MSK: No muscle spasm, no deformity, no limitation of range of movement in spin Heme: No easy bruising.  Travel history: No recent long distant travel.   Allergy:  Allergies  Allergen Reactions   Sulfate Rash   Codeine Sulfate Nausea Only   Benicar [Olmesartan]     Talked with patient February 10, 2020, intolerance is unclear, tried several medications around that time and one of them gave her a rash but she is not clear which 1.   Amoxicillin Rash   Clindamycin/Lincomycin Rash   Entresto  [Sacubitril -Valsartan ] Other (See Comments)    hyperkalemia   Morphine And Codeine Rash   Penicillins Rash    Past Medical History:  Diagnosis Date   B12 deficiency    Cardiomyopathy (HCC)    a. 09/2022 Echo: EF 35-40%; b. 09/2022 MV: apical defect w/ mild peri-infarct ischemia. EF 43%.   Chronic HFrEF (heart failure with reduced ejection fraction) (HCC)    a. 01/2020 Echo: EF 30-35%; b. 07/2020 Echo: EF 50-55%; c. 06/2022 Echo: EF 50-55%; d. 09/2022 Echo: EF 35-40%, globh HK, mod reduced RV fxn, sev BAE, mod MR.   CKD (chronic kidney disease), stage IV (HCC)    COPD (chronic obstructive pulmonary disease) (HCC)    Depression    Endometriosis    Hypertension  Mixed hyperlipidemia    Moderate mitral regurgitation    Normocytic anemia    PAF (paroxysmal atrial fibrillation) (HCC)    a. CHA2DS2VASc = 4-->eliquis  2.5 bid.   Paroxymsal Atrial flutter (HCC)    Tobacco abuse     Past Surgical History:  Procedure Laterality Date   ABDOMINAL HYSTERECTOMY     ATRIAL FIBRILLATION ABLATION N/A 08/04/2023   Procedure: ATRIAL FIBRILLATION ABLATION;  Surgeon: Cindie Ole DASEN, MD;  Location: [facility] INVASIVE CV LAB;  Service: Cardiovascular;  Laterality: N/A;   BREAST BIOPSY Right 2015   neg-  FIBROADENOMA    CENTRAL LINE INSERTION  11/15/2022   Procedure: CENTRAL LINE INSERTION;  Surgeon: Mady Bruckner, MD;  Location: [facility] INVASIVE CV LAB;  Service: Cardiovascular;;   COLONOSCOPY WITH PROPOFOL  N/A 11/24/2022   Procedure: COLONOSCOPY WITH PROPOFOL ;  Surgeon: Maryruth Ole DASEN, MD;  Location: [facility] ENDOSCOPY;  Service: Endoscopy;  Laterality: N/A;   COLONOSCOPY WITH PROPOFOL  N/A 11/23/2022   Procedure: COLONOSCOPY WITH PROPOFOL ;  Surgeon: Maryruth Ole DASEN, MD;  Location: [facility] ENDOSCOPY;  Service: Endoscopy;  Laterality: N/A;   ESOPHAGOGASTRODUODENOSCOPY (EGD) WITH PROPOFOL  N/A 11/23/2022   Procedure: ESOPHAGOGASTRODUODENOSCOPY (EGD) WITH PROPOFOL ;  Surgeon: Maryruth Ole DASEN, MD;  Location: [facility] ENDOSCOPY;  Service: Endoscopy;  Laterality: N/A;   RIGHT HEART CATH N/A 11/15/2022   Procedure: RIGHT HEART CATH;  Surgeon: Mady Bruckner, MD;  Location: [facility] INVASIVE CV LAB;  Service: Cardiovascular;  Laterality: N/A;   RIGHT/LEFT HEART CATH AND CORONARY ANGIOGRAPHY N/A 11/25/2022   Procedure: RIGHT/LEFT HEART CATH AND CORONARY ANGIOGRAPHY;  Surgeon: Darron Deatrice LABOR, MD;  Location: [facility] INVASIVE CV LAB;  Service: Cardiovascular;  Laterality: N/A;   TAH/RSO  1999   secondary to bleeding and endometriosis (Dr Trula)   TRANSESOPHAGEAL ECHOCARDIOGRAM (CATH LAB) N/A 08/04/2023   Procedure: TRANSESOPHAGEAL ECHOCARDIOGRAM;  Surgeon: Cindie Ole DASEN, MD;  Location: [facility] INVASIVE CV LAB;  Service: Cardiovascular;  Laterality: N/A;    Social History:  reports that she quit smoking about 17 months ago. Her smoking use included cigarettes. She started smoking about 50 years ago. She has a 24.5 pack-year smoking history. She has never used smokeless tobacco. She reports that she does not currently use alcohol. She reports that she does not use drugs.  Family History:  Family History  Problem Relation Age of Onset   Hypercholesterolemia Mother    Rheum arthritis Father    Rheum arthritis Daughter     Fibromyalgia Daughter    Breast cancer Neg Hx    Colon cancer Neg Hx      Prior to Admission medications   Medication Sig Start Date End Date Taking? Authorizing Provider  acetaminophen  (TYLENOL ) 325 MG tablet Take 650 mg by mouth every 6 (six) hours as needed (pain.).    [provider]  albuterol  (VENTOLIN  HFA) 108 (90 Base) MCG/ACT inhaler Inhale 2 puffs into the lungs every 6 (six) hours as needed for wheezing or shortness of breath. 02/19/24   Glendia Shad, MD  atorvastatin  (LIPITOR) 40 MG tablet Take 1 tablet (40 mg total) by mouth daily. 02/19/24   Glendia Shad, MD  buPROPion  (WELLBUTRIN  XL) 150 MG 24 hr tablet Take 1 tablet (150 mg total) by mouth daily. 02/19/24   Glendia Shad, MD  calcitRIOL  (ROCALTROL ) 0.25 MCG capsule Take 0.25 mcg by mouth daily.    [provider]  calcium  carbonate (OS-CAL - DOSED IN MG OF ELEMENTAL CALCIUM ) 1250 (500 Ca) MG tablet Take 1 tablet (500 mg of elemental calcium  total) by  mouth 3 (three) times daily with meals. 12/14/21   Patel, Sona, MD  carvedilol  (COREG ) 3.125 MG tablet Take 1 tablet (3.125 mg total) by mouth 2 (two) times daily. 12/31/23 03/30/24  Riddle, Suzann, NP  FARXIGA  10 MG TABS tablet Take 10 mg by mouth daily. 11/20/21   [provider]  hydrALAZINE  (APRESOLINE ) 25 MG tablet Take 1 tablet (25 mg total) by mouth 3 (three) times daily. 10/15/23   Rolan Ezra RAMAN, MD  isosorbide  mononitrate (IMDUR ) 30 MG 24 hr tablet Take 1 tablet (30 mg total) by mouth daily. 04/15/23 07/21/24  Rolan Ezra RAMAN, MD  levothyroxine  (SYNTHROID ) 75 MCG tablet Take 1 tablet (75 mcg total) by mouth daily before breakfast. 02/19/24   Glendia Shad, MD  loratadine  (CLARITIN ) 10 MG tablet Take 10 mg by mouth daily.    [provider]  omeprazole  (PRILOSEC) 20 MG capsule Take 1 capsule (20 mg total) by mouth daily. 09/18/23   Riddle, Suzann, NP  torsemide  (DEMADEX ) 20 MG tablet TAKE 60 MG ALTERNATING WITH 40 MG EVERY OTHER DAY 07/31/23    Gollan, Timothy J, MD  venlafaxine  XR (EFFEXOR -XR) 150 MG 24 hr capsule Take 1 capsule (150 mg total) by mouth daily. 02/19/24   Glendia Shad, MD  warfarin (COUMADIN ) 2.5 MG tablet Take 2.5 mg by mouth daily. Patient takes 1 1/2 tablet on M,T,TH,F,Sat, Sun.  Takes 2 tablets on Wednesday.    [provider]    Physical Exam: Vitals:   02/23/24 1830 02/23/24 1900 02/23/24 1930 02/23/24 2000  BP: 136/63 (!) 143/53 (!) 146/58 (!) 140/79  Pulse: (!) 50 96 65 66  Resp: 19 (!) 22 (!) 22 (!) 22  Temp:      TempSrc:      SpO2: 90% (!) 87% (!) 89% 94%  Weight:      Height:       General: Has moderate acute respiratory distress HEENT:       Eyes: PERRL, EOMI, no jaundice       ENT: No discharge from the ears and nose, no pharynx injection, no tonsillar enlargement.        Neck: No JVD, no bruit, no mass felt. Heme: No neck lymph node enlargement. Cardiac: S1/S2, RRR, No gallops or rubs. Respiratory: Has severely decreased air movement bilaterally, worse on the left side, has mild wheezing bilaterally. GI: Soft, nondistended, nontender, no rebound pain, no organomegaly, BS present. GU: No hematuria Ext: No pitting leg edema bilaterally. 1+DP/PT pulse bilaterally. Musculoskeletal: No joint deformities, No joint redness or warmth, no limitation of ROM in spin. Skin: No rashes.  Neuro: Alert, oriented X3, cranial nerves II-XII grossly intact, moves all extremities normally. Psych: Patient is not psychotic, no suicidal or hemocidal ideation.  Labs on Admission: I have personally reviewed following labs and imaging studies  CBC: Recent Labs  Lab 02/19/24 0915 02/23/24 1720  WBC 6.4 16.1*  NEUTROABS 4.4  --   HGB 15.1* 15.1*  HCT 46.1* 46.2*  MCV 94.5 95.1  PLT 94.0* 124*   Basic Metabolic Panel: Recent Labs  Lab 02/19/24 0915 02/23/24 1720  NA 138 138  K 5.2 No hemolysis seen* 5.0  CL 102 100  CO2 28 25  GLUCOSE 94 108*  BUN 41* 42*  CREATININE 2.66* 2.77*   CALCIUM  9.3 8.9   GFR: Estimated Creatinine Clearance: 15.9 mL/min (A) (by C-G formula based on SCr of 2.77 mg/dL (H)). Liver Function Tests: Recent Labs  Lab 02/19/24 0915 02/23/24 1720  AST 15  16  ALT 13 13  ALKPHOS 89 84  BILITOT 0.4 0.8  PROT 6.5 6.4*  ALBUMIN  4.4 3.6   No results for input(s): LIPASE, AMYLASE in the last 168 hours. No results for input(s): AMMONIA in the last 168 hours. Coagulation Profile: No results for input(s): INR, PROTIME in the last 168 hours. Cardiac Enzymes: No results for input(s): CKTOTAL, CKMB, CKMBINDEX, TROPONINI in the last 168 hours. BNP (last 3 results) No results for input(s): PROBNP in the last 8760 hours. HbA1C: No results for input(s): HGBA1C in the last 72 hours. CBG: No results for input(s): GLUCAP in the last 168 hours. Lipid Profile: No results for input(s): CHOL, HDL, LDLCALC, TRIG, CHOLHDL, LDLDIRECT in the last 72 hours. Thyroid  Function Tests: No results for input(s): TSH, T4TOTAL, FREET4, T3FREE, THYROIDAB in the last 72 hours. Anemia Panel: No results for input(s): VITAMINB12, FOLATE, FERRITIN, TIBC, IRON, RETICCTPCT in the last 72 hours. Urine analysis:    Component Value Date/Time   COLORURINE YELLOW (A) 12/07/2023 1523   APPEARANCEUR CLEAR (A) 12/07/2023 1523   LABSPEC 1.013 12/07/2023 1523   PHURINE 5.0 12/07/2023 1523   GLUCOSEU NEGATIVE 12/07/2023 1523   GLUCOSEU NEGATIVE 01/22/2017 1052   HGBUR NEGATIVE 12/07/2023 1523   BILIRUBINUR NEGATIVE 12/07/2023 1523   KETONESUR NEGATIVE 12/07/2023 1523   PROTEINUR 100 (A) 12/07/2023 1523   UROBILINOGEN 0.2 01/22/2017 1052   NITRITE NEGATIVE 12/07/2023 1523   LEUKOCYTESUR SMALL (A) 12/07/2023 1523   Sepsis Labs: @LABRCNTIP (procalcitonin:4,lacticidven:4) )No results found for this or any previous visit (from the past 240 hours).   Radiological Exams on Admission:   Assessment/Plan Principal Problem:    Acute on chronic respiratory failure with hypoxia (HCC) Active Problems:   COPD exacerbation (HCC)   Chronic diastolic CHF (congestive heart failure) (HCC)   Myocardial injury   Idiopathic thrombocytopenic purpura (ITP) (HCC)   Persistent atrial fibrillation (HCC)   Hypertension   HLD (hyperlipidemia)   CKD (chronic kidney disease), stage IV (HCC)   Hypothyroidism   Depression with anxiety   Assessment and Plan:   Acute on chronic respiratory failure with hypoxia due to COPD exacerbation: Patient has productive cough, SOB, wheezing on auscultation, clinically consistent with COPD exacerbation.  Chest x-ray negative for infiltration.  Patient is requiring 6 L oxygen currently.  VBG with pH 7.41, CO2 40, O2 60.  -will admit patient to PCU as inpt -Bronchodilators and prn Mucinex  -Solu-Medrol  80 mg IV daily after given 125 mg of Solu-Medrol  -Oral doxycycline  100 mg twice daily -Incentive spirometry -Check RSEP panel -Follow up sputum culture  Chronic diastolic CHF (congestive heart failure) (HCC): 2D echo on 11/27/2023 showed EF of 55-60%.  Patient has elevated BNP of 335, but no leg edema or JVD.  Does not seem to have CHF exacerbation. -Continue home torsemide  40-20 mg twice daily  Myocardial injury: Troponin 25 --> 26, no chest pain, likely demand ischemia. -Lipitor, Coreg , Imdur   Idiopathic thrombocytopenic purpura (ITP) (HCC): Platelet 124, no active bleeding. -Follow-up with CBC  Persistent atrial fibrillation (HCC): Heart rate 46 --> 96 -Continue home Coreg  - Continue Coumadin  per pharmacy dosing - Check INR  Hypertension -IV hydralazine  as needed - Patient is on Coreg , torsemide , Imdur , or hydralazine   HLD (hyperlipidemia) -Lipitor  CKD (chronic kidney disease), stage IV (HCC): Renal function at baseline.  Baseline creatinine 2.0-2.8 recently.  Her creatinine is at 2.77, BUN 42, GFR 18. -Follow-up with BMP  Hypothyroidism -Synthroid   Depression with  anxiety - Continue home medications  DVT ppx: On Coumadin    Code Status: Full code   Family Communication:     not done, no family member is at bed side.      Disposition Plan:  Anticipate discharge back to previous environment  Consults called:  none  Admission status and Level of care: Progressive:    for obs as inpt        Dispo: The patient is from: Home              Anticipated d/c is to: Home              Anticipated d/c date is: 2 days              Patient currently is not medically stable to d/c.    Severity of Illness:  The appropriate patient status for this patient is INPATIENT. Inpatient status is judged to be reasonable and necessary in order to provide the required intensity of service to ensure the patient's safety. The patient's presenting symptoms, physical exam findings, and initial radiographic and laboratory data in the context of their chronic comorbidities is felt to place them at high risk for further clinical deterioration. Furthermore, it is not anticipated that the patient will be medically stable for discharge from the hospital within 2 midnights of admission.   * I certify that at the point of admission it is my clinical judgment that the patient will require inpatient hospital care spanning beyond 2 midnights from the point of admission due to high intensity of service, high risk for further deterioration and high frequency of surveillance required.*       Date of Service 02/23/2024    Caleb Exon [facility]   If 7PM-7AM, please contact night-coverage [email or web address] 02/23/2024, 9:50 PM

## 2024-02-23 NOTE — ED Notes (Addendum)
 Dr Hilma sent message through secure chat: [facility] called earlier and said it looked like pt had a 1st degree heart block. Just wanted to make you aware. They said they saved the strip in the system.  Dr Hilma replied: Got it, thanks

## 2024-02-23 NOTE — Telephone Encounter (Signed)
 Copied from CRM [id number]. Topic: General - Other >> Feb 23, 2024  1:38 PM Rosina BIRCH wrote: Reason for CRM: patient called stating she was told by MD Glendia to call the office if her cold did not get any better. Patient stated it is not better  CB [phone]

## 2024-02-23 NOTE — Telephone Encounter (Signed)
 Copied from CRM [id number]. Topic: General - Other >> Feb 23, 2024  1:38 PM Dawn Sherman wrote: Reason for CRM: patient called stating she was told by MD Glendia to call the office if her cold did not get any better. Patient stated it is not better  CB [phone]

## 2024-02-23 NOTE — ED Notes (Signed)
 EKG obtained and given to provider. RT at bedside for CXR

## 2024-02-23 NOTE — Telephone Encounter (Signed)
 Confirm no allergy to azithromycin  (zpak). If no, then I will call in abx.  Follow symptoms. If no better or if symptoms worsen, will need to be evaluated.

## 2024-02-24 ENCOUNTER — Other Ambulatory Visit: Payer: Self-pay

## 2024-02-24 DIAGNOSIS — J441 Chronic obstructive pulmonary disease with (acute) exacerbation: Secondary | ICD-10-CM | POA: Diagnosis not present

## 2024-02-24 DIAGNOSIS — J9621 Acute and chronic respiratory failure with hypoxia: Secondary | ICD-10-CM | POA: Diagnosis not present

## 2024-02-24 DIAGNOSIS — I5A Non-ischemic myocardial injury (non-traumatic): Secondary | ICD-10-CM | POA: Diagnosis not present

## 2024-02-24 DIAGNOSIS — I5032 Chronic diastolic (congestive) heart failure: Secondary | ICD-10-CM | POA: Diagnosis not present

## 2024-02-24 LAB — RESPIRATORY PANEL BY PCR

## 2024-02-24 LAB — CBC
HCT: 44.9 % (ref 36.0–46.0)
Hemoglobin: 14.5 g/dL (ref 12.0–15.0)
MCH: 30.5 pg (ref 26.0–34.0)
MCHC: 32.3 g/dL (ref 30.0–36.0)
MCV: 94.5 fL (ref 80.0–100.0)
Platelets: 115 10*3/uL — ABNORMAL LOW (ref 150–400)
RBC: 4.75 MIL/uL (ref 3.87–5.11)
RDW: 13.2 % (ref 11.5–15.5)
WBC: 15.2 10*3/uL — ABNORMAL HIGH (ref 4.0–10.5)
nRBC: 0 % (ref 0.0–0.2)

## 2024-02-24 LAB — EXPECTORATED SPUTUM ASSESSMENT W GRAM STAIN, RFLX TO RESP C

## 2024-02-24 LAB — BASIC METABOLIC PANEL WITH GFR
Anion gap: 13 (ref 5–15)
BUN: 49 mg/dL — ABNORMAL HIGH (ref 8–23)
CO2: 27 mmol/L (ref 22–32)
Calcium: 8.6 mg/dL — ABNORMAL LOW (ref 8.9–10.3)
Chloride: 97 mmol/L — ABNORMAL LOW (ref 98–111)
Creatinine, Ser: 2.93 mg/dL — ABNORMAL HIGH (ref 0.44–1.00)
GFR, Estimated: 17 mL/min — ABNORMAL LOW (ref >60)
Glucose, Bld: 133 mg/dL — ABNORMAL HIGH (ref 70–99)
Potassium: 4.6 mmol/L (ref 3.5–5.1)
Sodium: 137 mmol/L (ref 135–145)

## 2024-02-24 LAB — PROTIME-INR
INR: 4.3 (ref 0.8–1.2)
Prothrombin Time: 43 s — ABNORMAL HIGH (ref 11.4–15.2)

## 2024-02-24 MED ORDER — ACETAMINOPHEN 500 MG PO TABS
1000.0000 mg | ORAL_TABLET | Freq: Once | ORAL | Status: AC
  Administered 2024-02-24: 1000 mg via ORAL
  Filled 2024-02-24: qty 2

## 2024-02-24 NOTE — Plan of Care (Signed)
 Dr. Janese of critical INR 4.3

## 2024-02-24 NOTE — Assessment & Plan Note (Signed)
 Management as above

## 2024-02-24 NOTE — Assessment & Plan Note (Signed)
 Renal function slightly above baseline but does not meet criteria for AKI. -Monitor renal function -Avoid nephrotoxins

## 2024-02-24 NOTE — Assessment & Plan Note (Signed)
 Continue home medications

## 2024-02-24 NOTE — Assessment & Plan Note (Signed)
 2D echo on 11/27/2023 showed EF of 55-60%.  Patient has elevated BNP of 335, but no leg edema or JVD.  Does not seem to have CHF exacerbation. -Continue home torsemide  40-20 mg twice daily

## 2024-02-24 NOTE — Telephone Encounter (Signed)
 Pt evaluated in ER. Admitted.

## 2024-02-24 NOTE — Assessment & Plan Note (Signed)
 Heart rate currently controlled. INR elevated -Continue with home Coreg  -Coumadin  per pharmacy

## 2024-02-24 NOTE — Progress Notes (Signed)
 Heart Failure Navigator Progress Note  Assessed for Heart & Vascular TOC clinic readiness.  Patient does not meet criteria due to current Advanced Heart Failure Team patient of Marca Ancona, MD.  Navigator will sign off at this time.  Roxy Horseman, RN, BSN [facility] Heart Failure Navigator Secure Chat Only

## 2024-02-24 NOTE — Assessment & Plan Note (Signed)
 Platelets at 115 today.  No concern of bleeding -Continue to monitor

## 2024-02-24 NOTE — Assessment & Plan Note (Signed)
 Continue home Synthroid

## 2024-02-24 NOTE — Progress Notes (Signed)
 PHARMACY - ANTICOAGULATION CONSULT NOTE  Pharmacy Consult for warfarin Indication: atrial fibrillation  Allergies  Allergen Reactions   Sulfate Rash   Codeine Sulfate Nausea Only   Benicar [Olmesartan]     Talked with patient February 10, 2020, intolerance is unclear, tried several medications around that time and one of them gave her a rash but she is not clear which 1.   Amoxicillin Rash   Clindamycin/Lincomycin Rash   Entresto  [Sacubitril -Valsartan ] Other (See Comments)    hyperkalemia   Morphine And Codeine Rash   Penicillins Rash    Patient Measurements: Height: 5' 5 (165.1 cm) Weight: 51.8 kg (114 lb 3.2 oz) IBW/kg (Calculated) : 57 HEPARIN  DW (KG): 51.8  Vital Signs: Temp: 98 F (36.7 C) (07/01 0941) Temp Source: Oral (07/01 0941) BP: 141/63 (07/01 0941) Pulse Rate: 81 (07/01 0941)  Labs: Recent Labs    02/23/24 1720 02/23/24 1917 02/24/24 0503  HGB 15.1*  --  14.5  HCT 46.2*  --  44.9  PLT 124*  --  115*  LABPROT 40.4*  --  43.0*  INR 4.0*  --  4.3*  CREATININE 2.77*  --  2.93*  TROPONINIHS 25* 26*  --     Estimated Creatinine Clearance: 15 mL/min (A) (by C-G formula based on SCr of 2.93 mg/dL (H)).   Medical History: Past Medical History:  Diagnosis Date   B12 deficiency    Cardiomyopathy (HCC)    a. 09/2022 Echo: EF 35-40%; b. 09/2022 MV: apical defect w/ mild peri-infarct ischemia. EF 43%.   Chronic HFrEF (heart failure with reduced ejection fraction) (HCC)    a. 01/2020 Echo: EF 30-35%; b. 07/2020 Echo: EF 50-55%; c. 06/2022 Echo: EF 50-55%; d. 09/2022 Echo: EF 35-40%, globh HK, mod reduced RV fxn, sev BAE, mod MR.   CKD (chronic kidney disease), stage IV (HCC)    COPD (chronic obstructive pulmonary disease) (HCC)    Depression    Endometriosis    Hypertension    Mixed hyperlipidemia    Moderate mitral regurgitation    Normocytic anemia    PAF (paroxysmal atrial fibrillation) (HCC)    a. CHA2DS2VASc = 4-->eliquis  2.5 bid.   Paroxymsal Atrial  flutter (HCC)    Tobacco abuse    Assessment: 68 y/o female presenting with progressively worsening SOB. PMH significant for paroxysmal A-fib s/p ablation, COPD, HTN, HLD, depression, and CKD IV. Pharmacy has been consulted to resume and manage warfarin while inpatient.   Home warfarin regimen: 5 mg on Wednesdays, 3.75 mg all other days (total weekly dose of 27.5 mg) Last warfarin dose prior to admission: 3.375 mg on 02/23/24   Baseline labs: INR pending, hgb 15.1, plt 124  Goal of Therapy:  INR 2-3 Monitor platelets by anticoagulation protocol: Yes    Date INR Warfarin Dose  6/30 4.0 3.375 mg (taken PTA)  7/1 4.3 Hold     Plan:  INR is supratherapeutic at 4.3 Hgb is stable, no mention of s/sx of bleeding in notes Hold warfarin dose today 02/24/24 Monitor daily INR while admitted, CBC at least weekly  Thank you for involving pharmacy in this patient's care.   Kayla JULIANNA Blew, PharmD, BCPS Clinical Pharmacist 02/24/2024 9:44 AM

## 2024-02-24 NOTE — Assessment & Plan Note (Signed)
 Currently on 5 L of oxygen.  Symptoms concerning for COPD exacerbation.  Pending expanded respiratory viral panel - Continue with bronchodilator and Mucinex  -Continue with steroid -Continue with doxycycline  -Continue with supportive care -Supplemental oxygen-wean as tolerated

## 2024-02-24 NOTE — Progress Notes (Signed)
 Progress Note   Patient: Dawn Sherman FMW:[id number] DOB: 03-15-1956 DOA: 02/23/2024     1 DOS: the patient was seen and examined on 02/24/2024   Brief hospital course: Taken from H&P.  Dawn Sherman is a 68 y.o. female with medical history significant of  COPD, HTN, HLD, GERD, depression, former smoker, alcohol abuse in remission, CKD-IV,  dCHF (used to have sCHF with EF 30-35%, improved to EF 50-60 by 2D Echo on 11/27/23), SDH, PAF on Coumadin , second-degree AV block, presents with SOB, progressively worsening for about 1 week.  Patient also has cough with greenish sputum.  No fever or chills.  No chest pain.  Patient was previously prescribed oxygen to use at home but apparently no longer using it.  On presentation patient was found to be in respiratory distress, cannot speak in full sentences and was placed on 6 L of oxygen.  Heart rate quite variable between mild bradycardia to mild tachycardia.  Labs with leukocytosis at 16.1, BNP 335, renal function close to baseline.  Troponin 25>> 26 EKG with NSR, QTc 354 and T wave inversion in inferior and V5 and V6 which seems similar to the prior EKG done in May 2025. Chest x-ray shows bronchitis changes, without any infiltration.  7/1: Vitals with mildly elevated blood pressure at 160/85, remained on 6 L of oxygen, respiratory panel negative.  Some improvement in leukocytosis to 15.2, Slight increase in creatinine to 2.93 but remained closer to baseline, respiratory cultures pending.  Checking expanded respiratory panel.  Assessment and Plan: * Acute on chronic respiratory failure with hypoxia (HCC) Currently on 5 L of oxygen.  Symptoms concerning for COPD exacerbation.  Pending expanded respiratory viral panel - Continue with bronchodilator and Mucinex  -Continue with steroid -Continue with doxycycline  -Continue with supportive care -Supplemental oxygen-wean as tolerated  COPD exacerbation (HCC) Management as above  Myocardial  injury Fairly positive troponin with no chest pain, likely demand ischemia. - Continue home Lipitor, Coreg  and Imdur   Chronic diastolic CHF (congestive heart failure) (HCC)  2D echo on 11/27/2023 showed EF of 55-60%.  Patient has elevated BNP of 335, but no leg edema or JVD.  Does not seem to have CHF exacerbation. -Continue home torsemide  40-20 mg twice daily  Persistent atrial fibrillation (HCC) Heart rate currently controlled. INR elevated -Continue with home Coreg  -Coumadin  per pharmacy  Idiopathic thrombocytopenic purpura (ITP) (HCC) Platelets at 115 today.  No concern of bleeding -Continue to monitor  Hypertension Blood pressure mildly elevated -Continue home Coreg , torsemide , Imdur  and hydralazine   HLD (hyperlipidemia) - Continue Lipitor  CKD (chronic kidney disease), stage IV (HCC) Renal function slightly above baseline but does not meet criteria for AKI. -Monitor renal function -Avoid nephrotoxins  Hypothyroidism - Continue home Synthroid   Depression with anxiety - Continue home medications   Subjective: Patient was seen and examined today.  Still having some shortness of breath and remained on 5 L of oxygen.  Physical Exam: Vitals:   02/24/24 0500 02/24/24 0941 02/24/24 1300 02/24/24 1500  BP: (!) 160/85 (!) 141/63 (!) 144/70 (!) 150/71  Pulse: (!) 31 81 85 95  Resp: (!) 22 (!) 21 18 (!) 22  Temp:  98 F (36.7 C) 98.5 F (36.9 C) 98 F (36.7 C)  TempSrc:  Oral Oral Oral  SpO2: 90% 92% 90% 91%  Weight:    51.6 kg  Height:    5' 5 (1.651 m)   General.  Malnourished lady, in no acute distress. Pulmonary.  Lungs clear bilaterally, normal  respiratory effort.  Slightly decreased breath sounds. CV.  Regular rate and rhythm, no JVD, rub or murmur. Abdomen.  Soft, nontender, nondistended, BS positive. CNS.  Alert and oriented .  No focal neurologic deficit. Extremities.  No edema, pulses intact and symmetrical. Psychiatry.  Judgment and insight appears  normal.   Data Reviewed: Prior data reviewed  Family Communication: Discussed with patient  Disposition: Status is: Inpatient Remains inpatient appropriate because: Severity of illness  Planned Discharge Destination: Home  Time spent: 50 minutes  This record has been created using Conservation officer, historic buildings. Errors have been sought and corrected,but may not always be located. Such creation errors do not reflect on the standard of care.   Author: Amaryllis Dare, MD 02/24/2024 3:25 PM  For on call review [email or web address].

## 2024-02-24 NOTE — ED Notes (Signed)
Informed RN bed assigned 

## 2024-02-24 NOTE — Assessment & Plan Note (Signed)
 Blood pressure mildly elevated -Continue home Coreg , torsemide , Imdur  and hydralazine 

## 2024-02-24 NOTE — Plan of Care (Signed)
   Problem: Education: Goal: Knowledge of General Education information will improve Description Including pain rating scale, medication(s)/side effects and non-pharmacologic comfort measures Outcome: Progressing   Problem: Health Behavior/Discharge Planning: Goal: Ability to manage health-related needs will improve Outcome: Progressing

## 2024-02-24 NOTE — Assessment & Plan Note (Signed)
 Fairly positive troponin with no chest pain, likely demand ischemia. - Continue home Lipitor, Coreg  and Imdur 

## 2024-02-24 NOTE — Assessment & Plan Note (Signed)
 -  Continue Lipitor

## 2024-02-24 NOTE — Hospital Course (Addendum)
 Taken from H&P.  Dawn Sherman is a 68 y.o. female with medical history significant of  COPD, HTN, HLD, GERD, depression, former smoker, alcohol abuse in remission, CKD-IV,  dCHF (used to have sCHF with EF 30-35%, improved to EF 50-60 by 2D Echo on 11/27/23), SDH, PAF on Coumadin , second-degree AV block, presents with SOB, progressively worsening for about 1 week.  Patient also has cough with greenish sputum.  No fever or chills.  No chest pain.  Patient was previously prescribed oxygen to use at home but apparently no longer using it.  On presentation patient was found to be in respiratory distress, cannot speak in full sentences and was placed on 6 L of oxygen.  Heart rate quite variable between mild bradycardia to mild tachycardia.  Labs with leukocytosis at 16.1, BNP 335, renal function close to baseline.  Troponin 25>> 26 EKG with NSR, QTc 354 and T wave inversion in inferior and V5 and V6 which seems similar to the prior EKG done in May 2025. Chest x-ray shows bronchitis changes, without any infiltration.  7/1: Vitals with mildly elevated blood pressure at 160/85, remained on 6 L of oxygen, respiratory panel negative.  Some improvement in leukocytosis to 15.2, Slight increase in creatinine to 2.93 but remained closer to baseline, respiratory cultures pending.  Checking expanded respiratory panel.

## 2024-02-25 ENCOUNTER — Ambulatory Visit

## 2024-02-25 ENCOUNTER — Other Ambulatory Visit: Payer: Self-pay | Admitting: Emergency Medicine

## 2024-02-25 ENCOUNTER — Inpatient Hospital Stay: Attending: Oncology

## 2024-02-25 DIAGNOSIS — J9621 Acute and chronic respiratory failure with hypoxia: Secondary | ICD-10-CM | POA: Diagnosis not present

## 2024-02-25 LAB — CBC
HCT: 42.3 % (ref 36.0–46.0)
Hemoglobin: 14.1 g/dL (ref 12.0–15.0)
MCH: 30.7 pg (ref 26.0–34.0)
MCHC: 33.3 g/dL (ref 30.0–36.0)
MCV: 92 fL (ref 80.0–100.0)
Platelets: 120 10*3/uL — ABNORMAL LOW (ref 150–400)
RBC: 4.6 MIL/uL (ref 3.87–5.11)
RDW: 13.2 % (ref 11.5–15.5)
WBC: 17.1 10*3/uL — ABNORMAL HIGH (ref 4.0–10.5)
nRBC: 0 % (ref 0.0–0.2)

## 2024-02-25 LAB — BASIC METABOLIC PANEL WITH GFR
Anion gap: 11 (ref 5–15)
BUN: 72 mg/dL — ABNORMAL HIGH (ref 8–23)
CO2: 25 mmol/L (ref 22–32)
Calcium: 8.6 mg/dL — ABNORMAL LOW (ref 8.9–10.3)
Chloride: 99 mmol/L (ref 98–111)
Creatinine, Ser: 3.2 mg/dL — ABNORMAL HIGH (ref 0.44–1.00)
GFR, Estimated: 15 mL/min — ABNORMAL LOW (ref >60)
Glucose, Bld: 131 mg/dL — ABNORMAL HIGH (ref 70–99)
Potassium: 4.5 mmol/L (ref 3.5–5.1)
Sodium: 135 mmol/L (ref 135–145)

## 2024-02-25 LAB — PROTIME-INR
INR: 2.1 — ABNORMAL HIGH (ref 0.8–1.2)
Prothrombin Time: 24.8 s — ABNORMAL HIGH (ref 11.4–15.2)

## 2024-02-25 MED ORDER — WARFARIN SODIUM 5 MG PO TABS
5.0000 mg | ORAL_TABLET | Freq: Once | ORAL | Status: AC
  Administered 2024-02-25: 5 mg via ORAL
  Filled 2024-02-25: qty 1

## 2024-02-25 MED ORDER — IPRATROPIUM-ALBUTEROL 0.5-2.5 (3) MG/3ML IN SOLN
3.0000 mL | Freq: Four times a day (QID) | RESPIRATORY_TRACT | Status: DC
  Administered 2024-02-25 – 2024-02-28 (×14): 3 mL via RESPIRATORY_TRACT
  Filled 2024-02-25 (×15): qty 3

## 2024-02-25 NOTE — Progress Notes (Addendum)
 PHARMACY - ANTICOAGULATION CONSULT NOTE  Pharmacy Consult for warfarin dosage adjustment  Indication: atrial fibrillation  Allergies  Allergen Reactions   Sulfate Rash   Codeine Sulfate Nausea Only   Benicar [Olmesartan]     Talked with patient February 10, 2020, intolerance is unclear, tried several medications around that time and one of them gave her a rash but she is not clear which 1.   Amoxicillin Rash   Clindamycin/Lincomycin Rash   Entresto  [Sacubitril -Valsartan ] Other (See Comments)    hyperkalemia   Morphine And Codeine Rash   Penicillins Rash    Patient Measurements: Height: 5' 5 (165.1 cm) Weight: 51.3 kg (113 lb) IBW/kg (Calculated) : 57 HEPARIN  DW (KG): 51.6  Vital Signs: Temp: 98.7 F (37.1 C) (07/02 1123) Temp Source: Oral (07/02 0406) BP: 151/69 (07/02 1123) Pulse Rate: 89 (07/02 1123)  Labs: Recent Labs    02/23/24 1720 02/23/24 1917 02/24/24 0503 02/25/24 0327  HGB 15.1*  --  14.5 14.1  HCT 46.2*  --  44.9 42.3  PLT 124*  --  115* 120*  LABPROT 40.4*  --  43.0* 24.8*  INR 4.0*  --  4.3* 2.1*  CREATININE 2.77*  --  2.93* 3.20*  TROPONINIHS 25* 26*  --   --     Estimated Creatinine Clearance: 13.6 mL/min (A) (by C-G formula based on SCr of 3.2 mg/dL (H)).   Medical History: Past Medical History:  Diagnosis Date   B12 deficiency    Cardiomyopathy (HCC)    a. 09/2022 Echo: EF 35-40%; b. 09/2022 MV: apical defect w/ mild peri-infarct ischemia. EF 43%.   Chronic HFrEF (heart failure with reduced ejection fraction) (HCC)    a. 01/2020 Echo: EF 30-35%; b. 07/2020 Echo: EF 50-55%; c. 06/2022 Echo: EF 50-55%; d. 09/2022 Echo: EF 35-40%, globh HK, mod reduced RV fxn, sev BAE, mod MR.   CKD (chronic kidney disease), stage IV (HCC)    COPD (chronic obstructive pulmonary disease) (HCC)    Depression    Endometriosis    Hypertension    Mixed hyperlipidemia    Moderate mitral regurgitation    Normocytic anemia    PAF (paroxysmal atrial fibrillation)  (HCC)    a. CHA2DS2VASc = 4-->eliquis  2.5 bid.   Paroxymsal Atrial flutter (HCC)    Tobacco abuse     Medications:  Scheduled:   atorvastatin   40 mg Oral Daily   buPROPion   150 mg Oral Daily   calcitRIOL   0.25 mcg Oral Daily   calcium  carbonate  1 tablet Oral TID WC   carvedilol   3.125 mg Oral BID WC   doxycycline   100 mg Oral Q12H   hydrALAZINE   25 mg Oral TID   ipratropium-albuterol   3 mL Nebulization Q6H   isosorbide  mononitrate  30 mg Oral Daily   levothyroxine   75 mcg Oral Q0600   loratadine   10 mg Oral Daily   methylPREDNISolone  (SOLU-MEDROL ) injection  80 mg Intravenous Daily   pantoprazole   40 mg Oral Daily   torsemide   20 mg Oral Q1500   torsemide   40 mg Oral Q breakfast   venlafaxine  XR  150 mg Oral Daily   Warfarin - Pharmacist Dosing Inpatient   Does not apply q1600   PRN: dextromethorphan -guaiFENesin   Assessment: 68 YO female with Afib requiring warfarin for anticoagulation. Notable PMH HTN, diastolic HF, CKD, hypothyroidism, tobacco use disorder, and COPD with exacerbation. CHAD-VASC score of 3. Hemoglobin of 14.1 (7/2) is stable. No major interacting medications present, though warfarin does have an interaction  with levothyroxine  (increased INR), venlafaxine  (increased bleeding risk), and doxycycline  (increased INR). At home, takes 5 mg of warfarin on Wednesdays, and 3.75 mg of warfarin (1.5 of the 2.5 mg tablet) all other days, or 27.5 mg weekly. Most recent INR therapeutic but trending downwards. INR likely to continue downward trend due to held dose of warfarin yesterday (7/1).   Date INR Warfarin Dose  7/2 2.1 5 mg   7/1 4.3 HELD     Goal of Therapy:  INR 2-3 Monitor platelets by anticoagulation protocol: Yes   Plan:  Administer patient's regular Wednesday home warfarin dose of 5 mg. Re-assess INR daily for further dosage adjustment. Continue CBC minimum every 3 days and every 7 days once stable.   Landers Prajapati [place] - PharmD Candidate 02/25/2024,12:06 PM

## 2024-02-25 NOTE — Plan of Care (Signed)
   Problem: Education: Goal: Knowledge of General Education information will improve Description Including pain rating scale, medication(s)/side effects and non-pharmacologic comfort measures Outcome: Progressing   Problem: Health Behavior/Discharge Planning: Goal: Ability to manage health-related needs will improve Outcome: Progressing

## 2024-02-25 NOTE — Care Management Important Message (Signed)
 Important Message  Patient Details  Name: LANIQUA TORRENS MRN: [id number] Date of Birth: 1956/07/09   Important Message Given:  Yes - Medicare IM     [name] 02/25/2024, 1:06 PM

## 2024-02-25 NOTE — Progress Notes (Signed)
 Progress Note    [name]  FMW:[id number] DOB: 02-23-1956  DOA: 02/23/2024 PCP: Glendia Shad, MD      Brief Narrative:    Medical records reviewed and are as summarized below:  Dawn Sherman is a 68 y.o. female with medical history significant of  COPD, HTN, HLD, GERD, depression, former smoker, alcohol use disorder in remission, CKD stage IV, chronic diastolic CHF ((used to have sCHF with EF 30-35%, improved to EF 50-60 by 2D Echo on 11/27/23), SDH, PAF on Coumadin , second-degree AV block.  She presented to the hospital with worsening shortness of breath and cough productive of greenish sputum.  Symptoms have been going on for about a week.  She says she was previously on home oxygen but she was no longer using it so oxygen was taken away.  On presentation, she was in respiratory distress and could not speak in full sentences.  She was hypoxic and was placed on 6 L of oxygen.    Assessment/Plan:   Principal Problem:   Acute on chronic respiratory failure with hypoxia (HCC) Active Problems:   COPD exacerbation (HCC)   Chronic diastolic CHF (congestive heart failure) (HCC)   Myocardial injury   Idiopathic thrombocytopenic purpura (ITP) (HCC)   Persistent atrial fibrillation (HCC)   Hypertension   HLD (hyperlipidemia)   CKD (chronic kidney disease), stage IV (HCC)   Hypothyroidism   Depression with anxiety    Body mass index is 18.8 kg/m.    Acute on chronic hypoxic respiratory failure: She was requiring 6 L/min oxygen on admission.  She has been weaned off to 4 L/min oxygen.  Wean off oxygen as able.   COPD exacerbation: Continue doxycycline , steroids and bronchodilators. Rhinovirus infection: Respiratory viral panel positive for rhinovirus.   Elevated troponin: Likely from chronic myocardial injury based on previously elevated troponin levels.   Paroxysmal atrial fibrillation: Rate controlled.  Continue Coreg  and warfarin.  Pharmacy to monitor INR.   INR was previously supratherapeutic at 4.3 but improved to 2.1.   Chronic diastolic CHF: Compensated.  BNP 335.4.  No clinical features of fluid overload.  Hold torsemide  given increasing creatinine.   CKD stage IV: Creatinine up from 2.93-3.20. Monitor BMP.    Comorbidities include hypertension, hyperlipidemia, hypothyroidism, depression, anxiety  Diet Order             Diet Heart Fluid consistency: Thin; Fluid restriction: 1500 mL Fluid  Diet effective now                            Consultants: None  Procedures: None    Medications:    atorvastatin   40 mg Oral Daily   buPROPion   150 mg Oral Daily   calcitRIOL   0.25 mcg Oral Daily   calcium  carbonate  1 tablet Oral TID WC   carvedilol   3.125 mg Oral BID WC   doxycycline   100 mg Oral Q12H   hydrALAZINE   25 mg Oral TID   ipratropium-albuterol   3 mL Nebulization Q6H   isosorbide  mononitrate  30 mg Oral Daily   levothyroxine   75 mcg Oral Q0600   loratadine   10 mg Oral Daily   methylPREDNISolone  (SOLU-MEDROL ) injection  80 mg Intravenous Daily   pantoprazole   40 mg Oral Daily   venlafaxine  XR  150 mg Oral Daily   warfarin  5 mg Oral ONCE-1600   Warfarin - Pharmacist Dosing Inpatient   Does not apply q1600  Continuous Infusions:   Anti-infectives (From admission, onward)    Start     Dose/Rate Route Frequency Ordered Stop   02/23/24 2200  doxycycline  (VIBRA -TABS) tablet 100 mg        100 mg Oral Every 12 hours 02/23/24 2016                Family Communication/Anticipated D/C date and plan/Code Status   DVT prophylaxis:  warfarin (COUMADIN ) tablet 5 mg     Code Status: Full Code  Family Communication: None Disposition Plan: Plan to discharge home in 1 to 2 days   Status is: Inpatient Remains inpatient appropriate because: COPD exacerbation with acute hypoxic respiratory failure       Subjective:   Interval events noted.  She complains of productive cough and shortness of  breath.  Objective:    Vitals:   02/25/24 0731 02/25/24 0800 02/25/24 1123 02/25/24 1350  BP: (!) 142/82  (!) 151/69   Pulse: 83  89   Resp: 18  16   Temp: 98.8 F (37.1 C)  98.7 F (37.1 C)   TempSrc:      SpO2: 93% 91% 93% (!) 89%  Weight:      Height:       No data found.   Intake/Output Summary (Last 24 hours) at 02/25/2024 1532 Last data filed at 02/25/2024 1018 Gross per 24 hour  Intake 730 ml  Output 700 ml  Net 30 ml   Filed Weights   02/23/24 1712 02/24/24 1500 02/25/24 0500  Weight: 51.8 kg 51.6 kg 51.3 kg    Exam:  GEN: NAD SKIN: Warm and dry EYES: No pallor or icterus ENT: MMM CV: RRR PULM: Decreased air entry bilaterally.  Bilateral expiratory wheezing ABD: soft, ND, NT, +BS CNS: AAO x 3, non focal EXT: No edema or tenderness        Data Reviewed:   I have personally reviewed following labs and imaging studies:  Labs: Labs show the following:   Basic Metabolic Panel: Recent Labs  Lab 02/19/24 0915 02/23/24 1720 02/24/24 0503 02/25/24 0327  NA 138 138 137 135  K 5.2 No hemolysis seen* 5.0 4.6 4.5  CL 102 100 97* 99  CO2 28 25 27 25   GLUCOSE 94 108* 133* 131*  BUN 41* 42* 49* 72*  CREATININE 2.66* 2.77* 2.93* 3.20*  CALCIUM  9.3 8.9 8.6* 8.6*   GFR Estimated Creatinine Clearance: 13.6 mL/min (A) (by C-G formula based on SCr of 3.2 mg/dL (H)). Liver Function Tests: Recent Labs  Lab 02/19/24 0915 02/23/24 1720  AST 15 16  ALT 13 13  ALKPHOS 89 84  BILITOT 0.4 0.8  PROT 6.5 6.4*  ALBUMIN  4.4 3.6   No results for input(s): LIPASE, AMYLASE in the last 168 hours. No results for input(s): AMMONIA in the last 168 hours. Coagulation profile Recent Labs  Lab 02/23/24 1720 02/24/24 0503 02/25/24 0327  INR 4.0* 4.3* 2.1*    CBC: Recent Labs  Lab 02/19/24 0915 02/23/24 1720 02/24/24 0503 02/25/24 0327  WBC 6.4 16.1* 15.2* 17.1*  NEUTROABS 4.4  --   --   --   HGB 15.1* 15.1* 14.5 14.1  HCT 46.1* 46.2* 44.9 42.3   MCV 94.5 95.1 94.5 92.0  PLT 94.0* 124* 115* 120*   Cardiac Enzymes: No results for input(s): CKTOTAL, CKMB, CKMBINDEX, TROPONINI in the last 168 hours. BNP (last 3 results) No results for input(s): PROBNP in the last 8760 hours. CBG: No results for input(s): GLUCAP in  the last 168 hours. D-Dimer: No results for input(s): DDIMER in the last 72 hours. Hgb A1c: No results for input(s): HGBA1C in the last 72 hours. Lipid Profile: No results for input(s): CHOL, HDL, LDLCALC, TRIG, CHOLHDL, LDLDIRECT in the last 72 hours. Thyroid  function studies: No results for input(s): TSH, T4TOTAL, T3FREE, THYROIDAB in the last 72 hours.  Invalid input(s): FREET3 Anemia work up: No results for input(s): VITAMINB12, FOLATE, FERRITIN, TIBC, IRON, RETICCTPCT in the last 72 hours. Sepsis Labs: Recent Labs  Lab 02/19/24 0915 02/23/24 1720 02/24/24 0503 02/25/24 0327  WBC 6.4 16.1* 15.2* 17.1*    Microbiology Recent Results (from the past 240 hours)  Resp panel by RT-PCR (RSV, Flu A&B, Covid) Anterior Nasal Swab     Status: None   Collection Time: 02/23/24  9:30 PM   Specimen: Anterior Nasal Swab  Result Value Ref Range Status   SARS Coronavirus 2 by RT PCR NEGATIVE NEGATIVE Final    Comment: (NOTE) SARS-CoV-2 target nucleic acids are NOT DETECTED.  The SARS-CoV-2 RNA is generally detectable in upper respiratory specimens during the acute phase of infection. The lowest concentration of SARS-CoV-2 viral copies this assay can detect is 138 copies/mL. A negative result does not preclude SARS-Cov-2 infection and should not be used as the sole basis for treatment or other patient management decisions. A negative result may occur with  improper specimen collection/handling, submission of specimen other than nasopharyngeal swab, presence of viral mutation(s) within the areas targeted by this assay, and inadequate number of viral copies(<138  copies/mL). A negative result must be combined with clinical observations, patient history, and epidemiological information. The expected result is Negative.  Fact Sheet for Patients:  BloggerCourse.com  Fact Sheet for Healthcare Providers:  SeriousBroker.it  This test is no t yet approved or cleared by the [place]  FDA and  has been authorized for detection and/or diagnosis of SARS-CoV-2 by FDA under an Emergency Use Authorization (EUA). This EUA will remain  in effect (meaning this test can be used) for the duration of the COVID-19 declaration under Section 564(b)(1) of the Act, 21 U.S.C.section 360bbb-3(b)(1), unless the authorization is terminated  or revoked sooner.       Influenza A by PCR NEGATIVE NEGATIVE Final   Influenza B by PCR NEGATIVE NEGATIVE Final    Comment: (NOTE) The Xpert Xpress SARS-CoV-2/FLU/RSV plus assay is intended as an aid in the diagnosis of influenza from Nasopharyngeal swab specimens and should not be used as a sole basis for treatment. Nasal washings and aspirates are unacceptable for Xpert Xpress SARS-CoV-2/FLU/RSV testing.  Fact Sheet for Patients: BloggerCourse.com  Fact Sheet for Healthcare Providers: SeriousBroker.it  This test is not yet approved or cleared by the [place]  FDA and has been authorized for detection and/or diagnosis of SARS-CoV-2 by FDA under an Emergency Use Authorization (EUA). This EUA will remain in effect (meaning this test can be used) for the duration of the COVID-19 declaration under Section 564(b)(1) of the Act, 21 U.S.C. section 360bbb-3(b)(1), unless the authorization is terminated or revoked.     Resp Syncytial Virus by PCR NEGATIVE NEGATIVE Final    Comment: (NOTE) Fact Sheet for Patients: BloggerCourse.com  Fact Sheet for Healthcare  Providers: SeriousBroker.it  This test is not yet approved or cleared by the [place]  FDA and has been authorized for detection and/or diagnosis of SARS-CoV-2 by FDA under an Emergency Use Authorization (EUA). This EUA will remain in effect (meaning this test can be used) for the duration of the  COVID-19 declaration under Section 564(b)(1) of the Act, 21 U.S.C. section 360bbb-3(b)(1), unless the authorization is terminated or revoked.  Performed at [facility], [address] [place], [place] [address]   Expectorated Sputum Assessment w Gram Stain, Rflx to Resp Cult     Status: None   Collection Time: 02/24/24  5:02 AM   Specimen: Sputum  Result Value Ref Range Status   Specimen Description SPUTUM  Final   Special Requests NONE  Final   Sputum evaluation   Final    THIS SPECIMEN IS ACCEPTABLE FOR SPUTUM CULTURE Performed at [facility], [address], [place], [place] [address]    Report Status 02/24/2024 FINAL  Final  Culture, Respiratory w Gram Stain     Status: None (Preliminary result)   Collection Time: 02/24/24  5:02 AM   Specimen: SPU  Result Value Ref Range Status   Specimen Description   Final    SPUTUM Performed at [facility], [address] [place], [place] [address]    Special Requests   Final    NONE Reflexed from [id number] Performed at [facility], [address] [place], [place] [address]    Gram Stain   Final    MODERATE WBC PRESENT, PREDOMINANTLY PMN ABUNDANT GRAM POSITIVE COCCI IN CHAINS FEW GRAM NEGATIVE RODS FEW GRAM NEGATIVE COCCOBACILLI    Culture   Final    CULTURE REINCUBATED FOR BETTER GROWTH Performed at [facility], [address] [place], [place] [address]    Report Status PENDING  Incomplete  Respiratory (~20 pathogens) panel by PCR     Status: Abnormal   Collection Time: 02/24/24 12:00 PM   Specimen: Nasopharyngeal Swab; Respiratory  Result Value Ref  Range Status   Adenovirus NOT DETECTED NOT DETECTED Final   Coronavirus 229E NOT DETECTED NOT DETECTED Final    Comment: (NOTE) The Coronavirus on the Respiratory Panel, DOES NOT test for the novel  Coronavirus (2019 nCoV)    Coronavirus HKU1 NOT DETECTED NOT DETECTED Final   Coronavirus NL63 NOT DETECTED NOT DETECTED Final   Coronavirus OC43 NOT DETECTED NOT DETECTED Final   Metapneumovirus NOT DETECTED NOT DETECTED Final   Rhinovirus / Enterovirus DETECTED (A) NOT DETECTED Final   Influenza A NOT DETECTED NOT DETECTED Final   Influenza B NOT DETECTED NOT DETECTED Final   Parainfluenza Virus 1 NOT DETECTED NOT DETECTED Final   Parainfluenza Virus 2 NOT DETECTED NOT DETECTED Final   Parainfluenza Virus 3 NOT DETECTED NOT DETECTED Final   Parainfluenza Virus 4 NOT DETECTED NOT DETECTED Final   Respiratory Syncytial Virus NOT DETECTED NOT DETECTED Final   Bordetella pertussis NOT DETECTED NOT DETECTED Final   Bordetella Parapertussis NOT DETECTED NOT DETECTED Final   Chlamydophila pneumoniae NOT DETECTED NOT DETECTED Final   Mycoplasma pneumoniae NOT DETECTED NOT DETECTED Final    Comment: Performed at [facility], [address], [place], [place] [address]    Procedures and diagnostic studies:  DG Chest Portable 1 View Result Date: 02/23/2024 CLINICAL DATA:  Shortness of breath.  COPD. EXAM: PORTABLE CHEST 1 VIEW COMPARISON:  11/20/2022 FINDINGS: The heart is normal in size. Stable mediastinal contours. Chronic hyperinflation and bronchial thickening. No confluent airspace disease. No pneumothorax or significant pleural effusion. Multiple remote left rib fractures. Scoliotic curvature of the spine. IMPRESSION: Chronic hyperinflation and bronchial thickening. No acute findings. Electronically Signed   By: Andrea Gasman M.D.   On: 02/23/2024 18:13  LOS: 2 days   Shadell Brenn  [facility]   Pager on [email or web address]. If 7PM-7AM, please contact  night-coverage at [email or web address]     02/25/2024, 3:32 PM

## 2024-02-25 NOTE — TOC Initial Note (Signed)
 Transition of Care [facility]) - Initial/Assessment Note    Patient Details  Name: Dawn Sherman MRN: [id number] Date of Birth: 03/30/1956  Transition of Care [facility] CM/SW Contact:    Lauraine JAYSON Carpen, LCSW Phone Number: 02/25/2024, 11:58 AM  Clinical Narrative:   Readmission prevention screen complete. CSW met with patient. No family at bedside. CSW introduced role and explained that discharge planning would be discussed. PCP is Allena Hamilton, MD. Husband drives her to her appointments. [facility] [place]. No issues affording medications. Patient lives at home with her husband and grandson. No home health or DME use prior to admission. She had previously been using home oxygen through [employer]. If she needs home oxygen again she would like to use [employer]. No further concerns. CSW will continue to follow patient for support and facilitate return home once stable. Husband will transport her home at discharge.               Expected Discharge Plan: Home/Self Care Barriers to Discharge: Continued Medical Work up   Patient Goals and CMS Choice            Expected Discharge Plan and Services                                              Prior Living Arrangements/Services   Lives with:: Relatives, Spouse Patient language and need for interpreter reviewed:: Yes Do you feel safe going back to the place where you live?: Yes      Need for Family Participation in Patient Care: Yes (Comment) Care giver support system in place?: Yes (comment)   Criminal Activity/Legal Involvement Pertinent to Current Situation/Hospitalization: No - Comment as needed  Activities of Daily Living   ADL Screening (condition at time of admission) Independently performs ADLs?: Yes (appropriate for developmental age) Is the patient deaf or have difficulty hearing?: No Does the patient have difficulty seeing, even when wearing glasses/contacts?: No Does the patient have difficulty  concentrating, remembering, or making decisions?: No  Permission Sought/Granted                  Emotional Assessment Appearance:: Appears stated age Attitude/Demeanor/Rapport: Engaged, Gracious Affect (typically observed): Accepting, Appropriate, Calm, Pleasant Orientation: : Oriented to Self, Oriented to Place, Oriented to  Time, Oriented to Situation Alcohol / Substance Use: Not Applicable Psych Involvement: No (comment)  Admission diagnosis:  COPD exacerbation (HCC) [J44.1] Patient Active Problem List   Diagnosis Date Noted   COPD exacerbation (HCC) 02/23/2024   Alcohol dependence (HCC) 02/22/2024   Demand ischemia (HCC) 12/08/2023   Symptomatic bradycardia 12/07/2023   Supratherapeutic INR 12/07/2023   Acute gastroenteritis 12/07/2023   Head congestion 10/26/2023   Atrial fibrillation (HCC) 10/08/2023   Long term (current) use of anticoagulants 10/08/2023   Heart failure with improved ejection fraction (HFimpEF) (HCC) 12/03/2022   Hypoxia 11/15/2022   Dilated cardiomyopathy (HCC) 11/15/2022   Mitral valve insufficiency 11/15/2022   Hyperkalemia 11/13/2022   Symptomatic anemia 11/12/2022   Pneumonia due to infectious organism 09/30/2022   Pleural effusion due to CHF (congestive heart failure) (HCC) 09/30/2022   Acute on chronic diastolic CHF (congestive heart failure) (HCC) 09/23/2022   Chest pain 09/23/2022   Acute on chronic respiratory failure with hypoxia (HCC) 09/23/2022   (HFpEF) heart failure with preserved ejection fraction (HCC) 09/07/2022   Positive D dimer  09/07/2022   Depression with anxiety 09/07/2022   Hypothyroidism 09/07/2022   Epigastric pain 08/14/2022   SOB (shortness of breath) 07/16/2022   Fatigue 07/16/2022   Acute on chronic congestive heart failure (HCC) 07/04/2022   Acute hypoxemic respiratory failure (HCC) 06/30/2022   Tachycardia 06/30/2022   Anemia 05/06/2022   HFrEF (heart failure with reduced ejection fraction) (HCC) 03/26/2022    Myocardial injury 03/26/2022   CKD (chronic kidney disease), stage IV (HCC) 03/26/2022   Iron deficiency anemia 03/26/2022   Generalized weakness 12/17/2021   Essential hypertension 12/17/2021   Dyslipidemia 12/17/2021   Depression, major, single episode, complete remission (HCC) 12/17/2021   GERD without esophagitis 12/17/2021   Chronic diastolic CHF (congestive heart failure) (HCC) 12/17/2021   AKI (acute kidney injury) (HCC) 12/16/2021   Second degree AV block    History of subarachnoid hemorrhage 12/10/2021   Hypokalemia 12/10/2021   Hyponatremia 12/10/2021   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   Persistent atrial fibrillation (HCC) 12/10/2021   Hypotension 12/10/2021   Swelling of lower extremity 10/28/2021   Prediabetes 08/19/2021   Chronic obstructive pulmonary disease (HCC) 11/22/2020   Paroxysmal atrial fibrillation (HCC) 08/12/2020   Dehydration    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HCC)    Acute on chronic combined systolic and diastolic CHF (congestive heart failure) (HCC) 02/10/2020   Leukocytosis 02/10/2020   HLD (hyperlipidemia) 02/10/2020   Elevated troponin 02/10/2020   GERD (gastroesophageal reflux disease) 02/10/2020   Acute respiratory failure with hypoxia (HCC) 02/10/2020   Abnormal mammogram 07/13/2019   History of alcohol abuse 07/31/2018   B12 deficiency 07/31/2018   Acute renal failure superimposed on stage 3b chronic kidney disease (HCC) 11/03/2016   Health care maintenance 12/02/2014   Tobacco use 12/02/2014   Hypercholesterolemia 11/29/2014   Encounter for screening colonoscopy 12/16/2013   Breast mass 12/15/2012   Hypertension 07/05/2012   Depression, major, single episode, mild (HCC) 07/05/2012   PCP:  Glendia Shad, MD Pharmacy:   [facility] [phone] [facility] [place], [place] - [address] [place] [address] Phone: [phone] Fax: [phone]  [facility] [address] [place] [address] Phone: [phone] Fax: [phone]     Social Drivers of Health (SDOH) Social History: SDOH Screenings   Food Insecurity: No Food Insecurity (02/24/2024)  Housing: Low Risk  (02/24/2024)  Transportation Needs: No Transportation Needs (02/24/2024)  Utilities: Not At Risk (02/24/2024)  Alcohol Screen: Low Risk  (12/15/2023)  Depression (PHQ2-9): Low Risk  (12/24/2023)  Financial Resource Strain: Low Risk  (12/15/2023)  Physical Activity: Inactive (12/15/2023)  Social Connections: Socially Integrated (02/24/2024)  Stress: No Stress Concern Present (12/15/2023)  Tobacco Use: Medium Risk (02/23/2024)  Health Literacy: Adequate Health Literacy (12/16/2023)   SDOH Interventions:     Readmission Risk Interventions    02/25/2024   11:54 AM 07/02/2022   12:26 PM 12/11/2021    3:17 PM  Readmission Risk Prevention Plan  Transportation Screening Complete Complete Complete  PCP or Specialist Appt within 3-5 Days Complete  Complete  HRI or Home Care Consult   Complete  Social Work Consult for Recovery Care Planning/Counseling Complete  Complete  Palliative Care Screening Not Applicable  Not Applicable  Medication Review Oceanographer) Complete Complete Complete  PCP or Specialist appointment within 3-5 days of discharge  Complete   SW Recovery Care/Counseling Consult  Complete   Palliative Care Screening  Not Applicable   Skilled Nursing Facility  Not Applicable

## 2024-02-25 NOTE — Plan of Care (Signed)
  Problem: Education: Goal: Knowledge of General Education information will improve Description: Including pain rating scale, medication(s)/side effects and non-pharmacologic comfort measures Outcome: Progressing   Problem: Health Behavior/Discharge Planning: Goal: Ability to manage health-related needs will improve Outcome: Progressing   Problem: Clinical Measurements: Goal: Diagnostic test results will improve Outcome: Progressing Goal: Respiratory complications will improve Outcome: Progressing Goal: Cardiovascular complication will be avoided Outcome: Progressing   Problem: Elimination: Goal: Will not experience complications related to urinary retention Outcome: Progressing

## 2024-02-26 DIAGNOSIS — J9621 Acute and chronic respiratory failure with hypoxia: Secondary | ICD-10-CM | POA: Diagnosis not present

## 2024-02-26 LAB — CULTURE, RESPIRATORY W GRAM STAIN: Culture: NORMAL

## 2024-02-26 LAB — PROTIME-INR
INR: 1.1 (ref 0.8–1.2)
Prothrombin Time: 15 s (ref 11.4–15.2)

## 2024-02-26 MED ORDER — WARFARIN SODIUM 6 MG PO TABS
6.0000 mg | ORAL_TABLET | Freq: Once | ORAL | Status: AC
  Administered 2024-02-26: 6 mg via ORAL
  Filled 2024-02-26: qty 1

## 2024-02-26 NOTE — Plan of Care (Signed)

## 2024-02-26 NOTE — Progress Notes (Signed)
 Progress Note   Patient: Dawn Sherman FMW:[id number] DOB: 29-Oct-1955 DOA: 02/23/2024     3 DOS: the patient was seen and examined on 02/26/2024   Brief hospital course:  From HPI EDELINE GREENING is a 68 y.o. female with medical history significant of  COPD, HTN, HLD, GERD, depression, former smoker, alcohol use disorder in remission, CKD stage IV, chronic diastolic CHF ((used to have sCHF with EF 30-35%, improved to EF 50-60 by 2D Echo on 11/27/23), SDH, PAF on Coumadin , second-degree AV block.  She presented to the hospital with worsening shortness of breath and cough productive of greenish sputum.  Symptoms have been going on for about a week.  She says she was previously on home oxygen but she was no longer using it so oxygen was taken away.   On presentation, she was in respiratory distress and could not speak in full sentences.  She was hypoxic and was placed on 6 L of oxygen.     Assessment and Plan:  Acute on chronic hypoxic respiratory failure: In the setting of COPD exacerbation COPD exacerbation:  Rhinovirus infection:  Respiratory viral panel positive for rhinovirus.  Continue doxycycline , steroids and bronchodilators. Continue supplemental oxygen and wean down as tolerated   Elevated troponin:  Likely from chronic myocardial injury based on previously elevated troponin levels. Denies chest pain EKG did not show findings of ischemia   Paroxysmal atrial fibrillation:  Rate controlled.  Continue Coreg  and warfarin.   Pharmacy to monitor INR.   Continue to monitor INR     Chronic diastolic CHF: Compensated.   BNP 335.4.   No clinical features of fluid overload.   Hold torsemide  given increasing creatinine. Monitor daily weight   CKD stage IV:  Creatinine around baseline Monitor renal function closely       Comorbidities include hypertension, hyperlipidemia, hypothyroidism, depression, anxiety   DVT prophylaxis:  warfarin (COUMADIN ) tablet 5 mg      Code Status:  Full Code   Family Communication: None Disposition Plan: Plan to discharge home in 1 to 2 days     Status is: Inpatient Remains inpatient appropriate because: COPD exacerbation with acute hypoxic respiratory failure   Subjective:  Patient seen and examined at bedside this morning She continues to be on 4 L of intranasal oxygen Patient does not use any oxygen at home Denied worsening cough nausea vomiting abdominal pain or chest pain  Physical Exam:  GEN: NAD SKIN: Warm and dry EYES: No pallor or icterus ENT: MMM CV: RRR PULM: Air entry decreased bibasilarly, expiratory wheezing noted ABD: soft, ND, NT, +BS CNS: AAO x 3, non focal EXT: No edema or tenderness    Vitals:   02/26/24 0500 02/26/24 0806 02/26/24 1158 02/26/24 1518  BP:  (!) 171/75 (!) 142/70 (!) 144/72  Pulse:  85 83 84  Resp:  16 14 14   Temp:  98.7 F (37.1 C) 98.7 F (37.1 C) 98.3 F (36.8 C)  TempSrc:      SpO2:  92% 94% 90%  Weight: 50.2 kg     Height:        Data Reviewed:    Latest Ref Rng & Units 02/25/2024    3:27 AM 02/24/2024    5:03 AM 02/23/2024    5:20 PM  CBC  WBC 4.0 - 10.5 K/uL 17.1  15.2  16.1   Hemoglobin 12.0 - 15.0 g/dL 85.8  85.4  84.8   Hematocrit 36.0 - 46.0 % 42.3  44.9  46.2  Platelets 150 - 400 K/uL 120  115  124        Latest Ref Rng & Units 02/25/2024    3:27 AM 02/24/2024    5:03 AM 02/23/2024    5:20 PM  BMP  Glucose 70 - 99 mg/dL 868  866  891   BUN 8 - 23 mg/dL 72  49  42   Creatinine 0.44 - 1.00 mg/dL 6.79  7.06  7.22   Sodium 135 - 145 mmol/L 135  137  138   Potassium 3.5 - 5.1 mmol/L 4.5  4.6  5.0   Chloride 98 - 111 mmol/L 99  97  100   CO2 22 - 32 mmol/L 25  27  25    Calcium  8.9 - 10.3 mg/dL 8.6  8.6  8.9      Disposition: Status is: Inpatient   Time spent: 56 minutes  Author: Drue ONEIDA Potter, MD 02/26/2024 4:36 PM  For on call review [email or web address].

## 2024-02-26 NOTE — Progress Notes (Signed)
 PHARMACY - ANTICOAGULATION CONSULT NOTE  Pharmacy Consult for warfarin dosage adjustment Indication: atrial fibrillation  Allergies  Allergen Reactions   Sulfate Rash   Codeine Sulfate Nausea Only   Benicar [Olmesartan]     Talked with patient February 10, 2020, intolerance is unclear, tried several medications around that time and one of them gave her a rash but she is not clear which 1.   Amoxicillin Rash   Clindamycin/Lincomycin Rash   Entresto  [Sacubitril -Valsartan ] Other (See Comments)    hyperkalemia   Morphine And Codeine Rash   Penicillins Rash    Patient Measurements: Height: 5' 5 (165.1 cm) Weight: 50.2 kg (110 lb 11.2 oz) IBW/kg (Calculated) : 57 HEPARIN  DW (KG): 51.6  Vital Signs: Temp: 98.7 F (37.1 C) (07/03 0806) BP: 171/75 (07/03 0806) Pulse Rate: 85 (07/03 0806)  Labs: Recent Labs    02/23/24 1720 02/23/24 1917 02/24/24 0503 02/25/24 0327 02/26/24 0510  HGB 15.1*  --  14.5 14.1  --   HCT 46.2*  --  44.9 42.3  --   PLT 124*  --  115* 120*  --   LABPROT 40.4*  --  43.0* 24.8* 15.0  INR 4.0*  --  4.3* 2.1* 1.1  CREATININE 2.77*  --  2.93* 3.20*  --   TROPONINIHS 25* 26*  --   --   --     Estimated Creatinine Clearance: 13.3 mL/min (A) (by C-G formula based on SCr of 3.2 mg/dL (H)).   Medical History: Past Medical History:  Diagnosis Date   B12 deficiency    Cardiomyopathy (HCC)    a. 09/2022 Echo: EF 35-40%; b. 09/2022 MV: apical defect w/ mild peri-infarct ischemia. EF 43%.   Chronic HFrEF (heart failure with reduced ejection fraction) (HCC)    a. 01/2020 Echo: EF 30-35%; b. 07/2020 Echo: EF 50-55%; c. 06/2022 Echo: EF 50-55%; d. 09/2022 Echo: EF 35-40%, globh HK, mod reduced RV fxn, sev BAE, mod MR.   CKD (chronic kidney disease), stage IV (HCC)    COPD (chronic obstructive pulmonary disease) (HCC)    Depression    Endometriosis    Hypertension    Mixed hyperlipidemia    Moderate mitral regurgitation    Normocytic anemia    PAF (paroxysmal  atrial fibrillation) (HCC)    a. CHA2DS2VASc = 4-->eliquis  2.5 bid.   Paroxymsal Atrial flutter (HCC)    Tobacco abuse     Medications:  Scheduled:   atorvastatin   40 mg Oral Daily   buPROPion   150 mg Oral Daily   calcitRIOL   0.25 mcg Oral Daily   calcium  carbonate  1 tablet Oral TID WC   carvedilol   3.125 mg Oral BID WC   doxycycline   100 mg Oral Q12H   hydrALAZINE   25 mg Oral TID   ipratropium-albuterol   3 mL Nebulization Q6H   isosorbide  mononitrate  30 mg Oral Daily   levothyroxine   75 mcg Oral Q0600   loratadine   10 mg Oral Daily   methylPREDNISolone  (SOLU-MEDROL ) injection  80 mg Intravenous Daily   pantoprazole   40 mg Oral Daily   venlafaxine  XR  150 mg Oral Daily   warfarin  6 mg Oral ONCE-1600   Warfarin - Pharmacist Dosing Inpatient   Does not apply q1600   PRN: dextromethorphan -guaiFENesin   Assessment: 68 YO female with Afib requiring warfarin for anticoagulation. Notable PMH of HTN, diastolic HF, CKD, hypothyroidism, tobacco use disorder, and COPD exacerbation. CHAD-VASC score of 3. Hemoglobin stable at 14.1 (from 7/2). No new drug interactions since  dose yesterday. Interactions with levothyroxine  (increased INR), venlafaxine  (increased bleeding risk), and doxycycline  (increased INR). At home, takes 5 mg on Wednesdays and 3.75 mg all other days (27.5 mg weekly). Most recent INR is subtherapeutic and continued downwards trend, but is likely to increase since she was given 5 mg yesterday and will be given 6 mg today. Not indicated for enoxaparin  bridge given current CHAD-VASC score and likely upwards trend of INR.   Date INR Warfarin Dose  7/3 1.1  6 mg   7/2 2.1  5 mg      Goal of Therapy:  INR 2-3 Monitor platelets by anticoagulation protocol: Yes   Plan:  Since INR of 1.1 is subtherapeutic, Administer 6 mg of warfarin today (1.5x patient's home dose - 3.75 mg). Re-assess INR daily for further dosage adjustment. Continue CBC minimum every 3 days and every 7 days  once stable.   Maricus Tanzi [place] - PharmD Candidate  02/26/2024,10:05 AM

## 2024-02-27 DIAGNOSIS — J9621 Acute and chronic respiratory failure with hypoxia: Secondary | ICD-10-CM | POA: Diagnosis not present

## 2024-02-27 LAB — BASIC METABOLIC PANEL WITH GFR
Anion gap: 8 (ref 5–15)
BUN: 86 mg/dL — ABNORMAL HIGH (ref 8–23)
CO2: 25 mmol/L (ref 22–32)
Calcium: 9 mg/dL (ref 8.9–10.3)
Chloride: 105 mmol/L (ref 98–111)
Creatinine, Ser: 2.8 mg/dL — ABNORMAL HIGH (ref 0.44–1.00)
GFR, Estimated: 18 mL/min — ABNORMAL LOW (ref >60)
Glucose, Bld: 95 mg/dL (ref 70–99)
Potassium: 4.8 mmol/L (ref 3.5–5.1)
Sodium: 138 mmol/L (ref 135–145)

## 2024-02-27 LAB — CBC WITH DIFFERENTIAL/PLATELET
Abs Immature Granulocytes: 0.09 K/uL — ABNORMAL HIGH (ref 0.00–0.07)
Basophils Absolute: 0 K/uL (ref 0.0–0.1)
Basophils Relative: 0 %
Eosinophils Absolute: 0 K/uL (ref 0.0–0.5)
Eosinophils Relative: 0 %
HCT: 41.6 % (ref 36.0–46.0)
Hemoglobin: 13.4 g/dL (ref 12.0–15.0)
Immature Granulocytes: 1 %
Lymphocytes Relative: 5 %
Lymphs Abs: 0.6 K/uL — ABNORMAL LOW (ref 0.7–4.0)
MCH: 30.1 pg (ref 26.0–34.0)
MCHC: 32.2 g/dL (ref 30.0–36.0)
MCV: 93.5 fL (ref 80.0–100.0)
Monocytes Absolute: 1 K/uL (ref 0.1–1.0)
Monocytes Relative: 7 %
Neutro Abs: 11.6 K/uL — ABNORMAL HIGH (ref 1.7–7.7)
Neutrophils Relative %: 87 %
Platelets: 123 K/uL — ABNORMAL LOW (ref 150–400)
RBC: 4.45 MIL/uL (ref 3.87–5.11)
RDW: 13.3 % (ref 11.5–15.5)
WBC: 13.3 K/uL — ABNORMAL HIGH (ref 4.0–10.5)
nRBC: 0 % (ref 0.0–0.2)

## 2024-02-27 LAB — PROTIME-INR
INR: 1.3 — ABNORMAL HIGH (ref 0.8–1.2)
Prothrombin Time: 17.1 s — ABNORMAL HIGH (ref 11.4–15.2)

## 2024-02-27 MED ORDER — WARFARIN SODIUM 5 MG PO TABS
5.0000 mg | ORAL_TABLET | Freq: Once | ORAL | Status: AC
  Administered 2024-02-27: 5 mg via ORAL
  Filled 2024-02-27: qty 1

## 2024-02-27 MED ORDER — IPRATROPIUM-ALBUTEROL 0.5-2.5 (3) MG/3ML IN SOLN
3.0000 mL | RESPIRATORY_TRACT | Status: DC | PRN
  Administered 2024-02-29: 3 mL via RESPIRATORY_TRACT
  Filled 2024-02-27: qty 3

## 2024-02-27 MED ORDER — WARFARIN SODIUM 5 MG PO TABS: 5.0000 mg | ORAL_TABLET | Freq: Once | ORAL | Status: DC

## 2024-02-27 NOTE — Progress Notes (Signed)
 Progress Note   Patient: Dawn Sherman FMW:[id number] DOB: 10-Apr-1956 DOA: 02/23/2024     4 DOS: the patient was seen and examined on 02/27/2024    Brief hospital course:   From HPI Dawn Sherman is a 68 y.o. female with medical history significant of  COPD, HTN, HLD, GERD, depression, former smoker, alcohol use disorder in remission, CKD stage IV, chronic diastolic CHF ((used to have sCHF with EF 30-35%, improved to EF 50-60 by 2D Echo on 11/27/23), SDH, PAF on Coumadin , second-degree AV block.  She presented to the hospital with worsening shortness of breath and cough productive of greenish sputum.  Symptoms have been going on for about a week.  She says she was previously on home oxygen but she was no longer using it so oxygen was taken away.   On presentation, she was in respiratory distress and could not speak in full sentences.  She was hypoxic and was placed on 6 L of oxygen.     Assessment and Plan:  Acute on chronic hypoxic respiratory failure: In the setting of COPD exacerbation COPD exacerbation:  Rhinovirus infection:  Respiratory viral panel positive for rhinovirus.  Continue doxycycline , steroids and bronchodilators. Continue supplemental oxygen and wean down as tolerated   Elevated troponin:  Likely from chronic myocardial injury based on previously elevated troponin levels. Denies chest pain EKG did not show findings of ischemia   Paroxysmal atrial fibrillation:  Rate controlled.  Continue Coreg  and warfarin.   Pharmacy to monitor INR.   Continue to monitor INR     Chronic diastolic CHF: Compensated.   BNP 335.4.   No clinical features of fluid overload.   Hold torsemide  given increasing creatinine. Monitor daily weight   CKD stage IV:  Creatinine around baseline Monitor renal function closely       Comorbidities include hypertension, hyperlipidemia, hypothyroidism, depression, anxiety    DVT prophylaxis:  warfarin (COUMADIN ) tablet 5 mg      Code  Status: Full Code   Family Communication: None Disposition Plan: Plan to discharge home in 1 to 2 days     Status is: Inpatient Remains inpatient appropriate because: COPD exacerbation with acute hypoxic respiratory failure     Subjective:  Patient seen and examined at bedside this morning She continues to be on 3 L of intranasal oxygen Patient does not use any oxygen at home Patient denies worsening cough nausea vomiting abdominal pain or chest pain   Physical Exam:   GEN: NAD SKIN: Warm and dry EYES: No pallor or icterus ENT: MMM CV: RRR PULM: Air entry decreased bibasilarly, expiratory wheezing noted ABD: soft, ND, NT, +BS CNS: AAO x 3, non focal EXT: No edema or tenderness     Data Reviewed:    Latest Ref Rng & Units 02/27/2024    2:32 AM 02/25/2024    3:27 AM 02/24/2024    5:03 AM  CBC  WBC 4.0 - 10.5 K/uL 13.3  17.1  15.2   Hemoglobin 12.0 - 15.0 g/dL 86.5  85.8  85.4   Hematocrit 36.0 - 46.0 % 41.6  42.3  44.9   Platelets 150 - 400 K/uL 123  120  115     Vitals:   02/27/24 1000 02/27/24 1045 02/27/24 1227 02/27/24 1618  BP:   (!) 167/76 (!) 155/83  Pulse:   100 82  Resp: (!) 23 19 14 16   Temp:   99 F (37.2 C) 97.8 F (36.6 C)  TempSrc:  SpO2:   91% (!) 89%  Weight:      Height:         Author: Drue ONEIDA Potter, MD 02/27/2024 5:38 PM  For on call review [email or web address].

## 2024-02-27 NOTE — Progress Notes (Addendum)
 PHARMACY - ANTICOAGULATION CONSULT NOTE  Pharmacy Consult for warfarin dosage adjustment Indication: atrial fibrillation  Allergies  Allergen Reactions   Sulfate Rash   Codeine Sulfate Nausea Only   Benicar [Olmesartan]     Talked with patient February 10, 2020, intolerance is unclear, tried several medications around that time and one of them gave her a rash but she is not clear which 1.   Amoxicillin Rash   Clindamycin/Lincomycin Rash   Entresto  [Sacubitril -Valsartan ] Other (See Comments)    hyperkalemia   Morphine And Codeine Rash   Penicillins Rash    Patient Measurements: Height: 5' 5 (165.1 cm) Weight: 51.8 kg (114 lb 1.6 oz) IBW/kg (Calculated) : 57 HEPARIN  DW (KG): 51.6  Vital Signs: Temp: 97.7 F (36.5 C) (07/04 0400) Temp Source: Oral (07/04 0400) BP: 168/81 (07/04 0400) Pulse Rate: 75 (07/04 0400)  Labs: Recent Labs    02/25/24 0327 02/26/24 0510 02/27/24 0232  HGB 14.1  --  13.4  HCT 42.3  --  41.6  PLT 120*  --  123*  LABPROT 24.8* 15.0 17.1*  INR 2.1* 1.1 1.3*  CREATININE 3.20*  --  2.80*    Estimated Creatinine Clearance: 15.7 mL/min (A) (by C-G formula based on SCr of 2.8 mg/dL (H)).   Medical History: Past Medical History:  Diagnosis Date   B12 deficiency    Cardiomyopathy (HCC)    a. 09/2022 Echo: EF 35-40%; b. 09/2022 MV: apical defect w/ mild peri-infarct ischemia. EF 43%.   Chronic HFrEF (heart failure with reduced ejection fraction) (HCC)    a. 01/2020 Echo: EF 30-35%; b. 07/2020 Echo: EF 50-55%; c. 06/2022 Echo: EF 50-55%; d. 09/2022 Echo: EF 35-40%, globh HK, mod reduced RV fxn, sev BAE, mod MR.   CKD (chronic kidney disease), stage IV (HCC)    COPD (chronic obstructive pulmonary disease) (HCC)    Depression    Endometriosis    Hypertension    Mixed hyperlipidemia    Moderate mitral regurgitation    Normocytic anemia    PAF (paroxysmal atrial fibrillation) (HCC)    a. CHA2DS2VASc = 4-->eliquis  2.5 bid.   Paroxymsal Atrial flutter  (HCC)    Tobacco abuse     Medications:  Scheduled:   atorvastatin   40 mg Oral Daily   buPROPion   150 mg Oral Daily   calcitRIOL   0.25 mcg Oral Daily   calcium  carbonate  1 tablet Oral TID WC   carvedilol   3.125 mg Oral BID WC   doxycycline   100 mg Oral Q12H   hydrALAZINE   25 mg Oral TID   ipratropium-albuterol   3 mL Nebulization Q6H   isosorbide  mononitrate  30 mg Oral Daily   levothyroxine   75 mcg Oral Q0600   loratadine   10 mg Oral Daily   methylPREDNISolone  (SOLU-MEDROL ) injection  80 mg Intravenous Daily   pantoprazole   40 mg Oral Daily   venlafaxine  XR  150 mg Oral Daily   Warfarin - Pharmacist Dosing Inpatient   Does not apply q1600   PRN: dextromethorphan -guaiFENesin   Assessment: 68 YO female with Afib requiring warfarin for anticoagulation. Notable PMH of HTN, diastolic HF, CKD, hypothyroidism, tobacco use disorder, and COPD exacerbation. CHAD-VASC score of 3. Hemoglobin stable at 14.1 (from 7/2). No new drug interactions since dose yesterday. Interactions with levothyroxine  (increased INR), venlafaxine  (increased bleeding risk), and doxycycline  (increased INR), and steroids (increase INR). At home, takes 5 mg on Wednesdays and 3.75 mg all other days (27.5 mg weekly). Most recent INR is subtherapeutic and continued downwards trend,  but is likely to increase since she was given 5 mg yesterday and will be given 6 mg today. Not indicated for enoxaparin  bridge given current CHAD-VASC score and likely upwards trend of INR.   Date INR Warfarin Dose  7/1 4.1  HELD  7/2 2.1  5 mg   7/3 1.1 6 mg  7/4 1.3 5 mg     Goal of Therapy:  INR 2-3 Monitor platelets by anticoagulation protocol: Yes   Plan:  INR subtherapeutic. Will give warfarin 5 mg (~0.33% increase from home dose). Predict INR will continue to trend up tomorrow. Pt is on doxy and steroids which may effect INR. Daily INR. CBC at least every 3 days. And 7 days once stable.    Discharge recommendation: restart home  regimen (5 mg on Wednesdays and 3.75 mg all other days (27.5 mg weekly))  Cathaleen GORMAN Blanch, PharmD, BCPS 02/27/2024,8:11 AM

## 2024-02-27 NOTE — Progress Notes (Signed)
 Attempted to wean pt to 2L O2. Pt c/o SOB, having visible difficulty breathing, accessory muscles in use. O2 turned up to 3.5L and pt states breathing is improved. She looks more comfortable at this time. Notified MD via epic chat.

## 2024-02-28 DIAGNOSIS — J9621 Acute and chronic respiratory failure with hypoxia: Secondary | ICD-10-CM | POA: Diagnosis not present

## 2024-02-28 LAB — BASIC METABOLIC PANEL WITH GFR
Anion gap: 10 (ref 5–15)
BUN: 86 mg/dL — ABNORMAL HIGH (ref 8–23)
CO2: 23 mmol/L (ref 22–32)
Calcium: 9.7 mg/dL (ref 8.9–10.3)
Chloride: 104 mmol/L (ref 98–111)
Creatinine, Ser: 2.6 mg/dL — ABNORMAL HIGH (ref 0.44–1.00)
GFR, Estimated: 19 mL/min — ABNORMAL LOW (ref >60)
Glucose, Bld: 82 mg/dL (ref 70–99)
Potassium: 4.9 mmol/L (ref 3.5–5.1)
Sodium: 137 mmol/L (ref 135–145)

## 2024-02-28 LAB — CBC WITH DIFFERENTIAL/PLATELET
Abs Immature Granulocytes: 0.2 K/uL — ABNORMAL HIGH (ref 0.00–0.07)
Basophils Absolute: 0 K/uL (ref 0.0–0.1)
Basophils Relative: 0 %
Eosinophils Absolute: 0 K/uL (ref 0.0–0.5)
Eosinophils Relative: 0 %
HCT: 43.7 % (ref 36.0–46.0)
Hemoglobin: 14.5 g/dL (ref 12.0–15.0)
Immature Granulocytes: 1 %
Lymphocytes Relative: 7 %
Lymphs Abs: 1.3 K/uL (ref 0.7–4.0)
MCH: 31 pg (ref 26.0–34.0)
MCHC: 33.2 g/dL (ref 30.0–36.0)
MCV: 93.4 fL (ref 80.0–100.0)
Monocytes Absolute: 1.6 K/uL — ABNORMAL HIGH (ref 0.1–1.0)
Monocytes Relative: 8 %
Neutro Abs: 15.8 K/uL — ABNORMAL HIGH (ref 1.7–7.7)
Neutrophils Relative %: 84 %
Platelets: 160 K/uL (ref 150–400)
RBC: 4.68 MIL/uL (ref 3.87–5.11)
RDW: 13.6 % (ref 11.5–15.5)
WBC: 18.8 K/uL — ABNORMAL HIGH (ref 4.0–10.5)
nRBC: 0 % (ref 0.0–0.2)

## 2024-02-28 LAB — PROTIME-INR
INR: 1.4 — ABNORMAL HIGH (ref 0.8–1.2)
Prothrombin Time: 18.1 s — ABNORMAL HIGH (ref 11.4–15.2)

## 2024-02-28 MED ORDER — WARFARIN SODIUM 5 MG PO TABS
5.0000 mg | ORAL_TABLET | Freq: Once | ORAL | Status: AC
  Administered 2024-02-28: 5 mg via ORAL
  Filled 2024-02-28: qty 1

## 2024-02-28 NOTE — Progress Notes (Signed)
 PHARMACY - ANTICOAGULATION CONSULT NOTE  Pharmacy Consult for warfarin dosage adjustment Indication: atrial fibrillation  Allergies  Allergen Reactions   Sulfate Rash   Codeine Sulfate Nausea Only   Benicar [Olmesartan]     Talked with patient February 10, 2020, intolerance is unclear, tried several medications around that time and one of them gave her a rash but she is not clear which 1.   Amoxicillin Rash   Clindamycin/Lincomycin Rash   Entresto  [Sacubitril -Valsartan ] Other (See Comments)    hyperkalemia   Morphine And Codeine Rash   Penicillins Rash    Patient Measurements: Height: 5' 5 (165.1 cm) Weight: 50.5 kg (111 lb 5.3 oz) IBW/kg (Calculated) : 57 HEPARIN  DW (KG): 51.6  Vital Signs: Temp: 97.8 F (36.6 C) (07/05 0729) Temp Source: Oral (07/05 0729) BP: 152/81 (07/05 0729) Pulse Rate: 88 (07/05 0729)  Labs: Recent Labs    02/26/24 0510 02/27/24 0232 02/28/24 0248  HGB  --  13.4 14.5  HCT  --  41.6 43.7  PLT  --  123* 160  LABPROT 15.0 17.1* 18.1*  INR 1.1 1.3* 1.4*  CREATININE  --  2.80* 2.60*    Estimated Creatinine Clearance: 16.5 mL/min (A) (by C-G formula based on SCr of 2.6 mg/dL (H)).   Medical History: Past Medical History:  Diagnosis Date   B12 deficiency    Cardiomyopathy (HCC)    a. 09/2022 Echo: EF 35-40%; b. 09/2022 MV: apical defect w/ mild peri-infarct ischemia. EF 43%.   Chronic HFrEF (heart failure with reduced ejection fraction) (HCC)    a. 01/2020 Echo: EF 30-35%; b. 07/2020 Echo: EF 50-55%; c. 06/2022 Echo: EF 50-55%; d. 09/2022 Echo: EF 35-40%, globh HK, mod reduced RV fxn, sev BAE, mod MR.   CKD (chronic kidney disease), stage IV (HCC)    COPD (chronic obstructive pulmonary disease) (HCC)    Depression    Endometriosis    Hypertension    Mixed hyperlipidemia    Moderate mitral regurgitation    Normocytic anemia    PAF (paroxysmal atrial fibrillation) (HCC)    a. CHA2DS2VASc = 4-->eliquis  2.5 bid.   Paroxymsal Atrial flutter  (HCC)    Tobacco abuse     Medications:  Scheduled:   atorvastatin   40 mg Oral Daily   buPROPion   150 mg Oral Daily   calcitRIOL   0.25 mcg Oral Daily   calcium  carbonate  1 tablet Oral TID WC   carvedilol   3.125 mg Oral BID WC   doxycycline   100 mg Oral Q12H   hydrALAZINE   25 mg Oral TID   ipratropium-albuterol   3 mL Nebulization Q6H   isosorbide  mononitrate  30 mg Oral Daily   levothyroxine   75 mcg Oral Q0600   loratadine   10 mg Oral Daily   methylPREDNISolone  (SOLU-MEDROL ) injection  80 mg Intravenous Daily   pantoprazole   40 mg Oral Daily   venlafaxine  XR  150 mg Oral Daily   Warfarin - Pharmacist Dosing Inpatient   Does not apply q1600   PRN: dextromethorphan -guaiFENesin , ipratropium-albuterol   Assessment: 68 YO female with Afib requiring warfarin for anticoagulation. Notable PMH of HTN, diastolic HF, CKD, hypothyroidism, tobacco use disorder, and COPD exacerbation. CHAD-VASC score of 3. Hemoglobin stable at 14.1 (from 7/2). No new drug interactions since dose yesterday. Interactions with levothyroxine  (increased INR), venlafaxine  (increased bleeding risk), and doxycycline  (increased INR), and steroids (increase INR). At home, takes 5 mg on Wednesdays and 3.75 mg all other days (27.5 mg weekly). Most recent INR is subtherapeutic and continued downwards  trend, but is likely to increase since she was given 5 mg yesterday and will be given 6 mg today. Not indicated for enoxaparin  bridge given current CHAD-VASC score and likely upwards trend of INR.   Date INR Warfarin Dose  7/1 4.1  HELD  7/2 2.1  5 mg   7/3 1.1 6 mg  7/4 1.3 5 mg  7/5 1.4              Goal of Therapy:  INR 2-3 Monitor platelets by anticoagulation protocol: Yes   Plan:  INR subtherapeutic. Will order warfarin 5 mg x 1 again  Predict INR will continue to trend up tomorrow. Pt is on doxy and steroids which may effect INR. Daily INR. CBC at least every 3 days. And 7 days once stable.    Discharge  recommendation: restart home regimen (5 mg on Wednesdays and 3.75 mg all other days (27.5 mg weekly))  Jaret Coppedge A, PharmD 02/28/2024,8:06 AM

## 2024-02-28 NOTE — Progress Notes (Signed)
 Progress Note   Patient: Dawn Sherman FMW:[id number] DOB: September 28, 1955 DOA: 02/23/2024     5 DOS: the patient was seen and examined on 02/28/2024   Brief hospital course:   From HPI Dawn Sherman is a 68 y.o. female with medical history significant of  COPD, HTN, HLD, GERD, depression, former smoker, alcohol use disorder in remission, CKD stage IV, chronic diastolic CHF ((used to have sCHF with EF 30-35%, improved to EF 50-60 by 2D Echo on 11/27/23), SDH, PAF on Coumadin , second-degree AV block.  She presented to the hospital with worsening shortness of breath and cough productive of greenish sputum.  Symptoms have been going on for about a week.  She says she was previously on home oxygen but she was no longer using it so oxygen was taken away.   On presentation, she was in respiratory distress and could not speak in full sentences.  She was hypoxic and was placed on 6 L of oxygen.     Assessment and Plan:  Acute on chronic hypoxic respiratory failure: In the setting of COPD exacerbation COPD exacerbation:  Rhinovirus infection:  Respiratory viral panel positive for rhinovirus.  Continue doxycycline , steroids and bronchodilators. Continue supplemental oxygen and wean down as tolerated   Elevated troponin:  Likely from chronic myocardial injury based on previously elevated troponin levels. Denies chest pain EKG did not show findings of ischemia   Paroxysmal atrial fibrillation:  Rate controlled.  Continue Coreg  and warfarin.   Pharmacy to monitor INR.   Continue to monitor INR     Chronic diastolic CHF: Compensated.   BNP 335.4.   No clinical features of fluid overload.   Hold torsemide  given increasing creatinine. Monitor daily weight   CKD stage IV:  Creatinine around baseline Monitor renal function closely       Comorbidities include hypertension, hyperlipidemia, hypothyroidism, depression, anxiety    DVT prophylaxis:  warfarin (COUMADIN ) tablet 5 mg      Code  Status: Full Code   Family Communication: None Disposition Plan: Plan to discharge home in 1 to 2 days     Status is: Inpatient Remains inpatient appropriate because: COPD exacerbation with acute hypoxic respiratory failure     Subjective:  Patient seen and examined at bedside this morning Patient on 4 L of oxygen She admits to profuse coughing that woke her up and kept her awake at night Wheezing improved   Physical Exam:   GEN: NAD SKIN: Warm and dry EYES: No pallor or icterus ENT: MMM CV: RRR PULM: Air entry decreased bibasilarly, expiratory wheezing noted ABD: soft, ND, NT, +BS CNS: AAO x 3, non focal EXT: No edema or tenderness     Data Reviewed:    Latest Ref Rng & Units 02/28/2024    2:48 AM 02/27/2024    2:32 AM 02/25/2024    3:27 AM  CBC  WBC 4.0 - 10.5 K/uL 18.8  13.3  17.1   Hemoglobin 12.0 - 15.0 g/dL 85.4  86.5  85.8   Hematocrit 36.0 - 46.0 % 43.7  41.6  42.3   Platelets 150 - 400 K/uL 160  123  120        Latest Ref Rng & Units 02/28/2024    2:48 AM 02/27/2024    2:32 AM 02/25/2024    3:27 AM  BMP  Glucose 70 - 99 mg/dL 82  95  868   BUN 8 - 23 mg/dL 86  86  72   Creatinine 0.44 - 1.00 mg/dL  2.60  2.80  3.20   Sodium 135 - 145 mmol/L 137  138  135   Potassium 3.5 - 5.1 mmol/L 4.9  4.8  4.5   Chloride 98 - 111 mmol/L 104  105  99   CO2 22 - 32 mmol/L 23  25  25    Calcium  8.9 - 10.3 mg/dL 9.7  9.0  8.6       Vitals:   02/28/24 0334 02/28/24 0500 02/28/24 0729 02/28/24 1124  BP: (!) 161/81  (!) 152/81 137/72  Pulse: 82  88 93  Resp: 20  18 20   Temp: 98.3 F (36.8 C)  97.8 F (36.6 C) 98.7 F (37.1 C)  TempSrc:   Oral Axillary  SpO2: 94%  92% 94%  Weight:  50.5 kg    Height:         Author: Drue ONEIDA Potter, MD 02/28/2024 3:14 PM  For on call review [email or web address].

## 2024-02-29 DIAGNOSIS — J9621 Acute and chronic respiratory failure with hypoxia: Secondary | ICD-10-CM | POA: Diagnosis not present

## 2024-02-29 LAB — BASIC METABOLIC PANEL WITH GFR
Anion gap: 8 (ref 5–15)
BUN: 83 mg/dL — ABNORMAL HIGH (ref 8–23)
CO2: 22 mmol/L (ref 22–32)
Calcium: 9 mg/dL (ref 8.9–10.3)
Chloride: 103 mmol/L (ref 98–111)
Creatinine, Ser: 2.58 mg/dL — ABNORMAL HIGH (ref 0.44–1.00)
GFR, Estimated: 20 mL/min — ABNORMAL LOW (ref >60)
Glucose, Bld: 123 mg/dL — ABNORMAL HIGH (ref 70–99)
Potassium: 5.3 mmol/L — ABNORMAL HIGH (ref 3.5–5.1)
Sodium: 133 mmol/L — ABNORMAL LOW (ref 135–145)

## 2024-02-29 LAB — CBC WITH DIFFERENTIAL/PLATELET
Abs Immature Granulocytes: 0.21 K/uL — ABNORMAL HIGH (ref 0.00–0.07)
Basophils Absolute: 0 K/uL (ref 0.0–0.1)
Basophils Relative: 0 %
Eosinophils Absolute: 0 K/uL (ref 0.0–0.5)
Eosinophils Relative: 0 %
HCT: 39.5 % (ref 36.0–46.0)
Hemoglobin: 13.1 g/dL (ref 12.0–15.0)
Immature Granulocytes: 1 %
Lymphocytes Relative: 4 %
Lymphs Abs: 0.8 K/uL (ref 0.7–4.0)
MCH: 30.8 pg (ref 26.0–34.0)
MCHC: 33.2 g/dL (ref 30.0–36.0)
MCV: 92.7 fL (ref 80.0–100.0)
Monocytes Absolute: 1 K/uL (ref 0.1–1.0)
Monocytes Relative: 6 %
Neutro Abs: 15.5 K/uL — ABNORMAL HIGH (ref 1.7–7.7)
Neutrophils Relative %: 89 %
Platelets: 164 K/uL (ref 150–400)
RBC: 4.26 MIL/uL (ref 3.87–5.11)
RDW: 13.5 % (ref 11.5–15.5)
WBC: 17.6 K/uL — ABNORMAL HIGH (ref 4.0–10.5)
nRBC: 0 % (ref 0.0–0.2)

## 2024-02-29 LAB — PROTIME-INR
INR: 1.8 — ABNORMAL HIGH (ref 0.8–1.2)
Prothrombin Time: 21.4 s — ABNORMAL HIGH (ref 11.4–15.2)

## 2024-02-29 MED ORDER — IPRATROPIUM-ALBUTEROL 0.5-2.5 (3) MG/3ML IN SOLN: 3.0000 mL | Freq: Three times a day (TID) | RESPIRATORY_TRACT | Status: DC

## 2024-02-29 MED ORDER — PREDNISONE 10 MG PO TABS: ORAL_TABLET | ORAL | 0 refills | Status: AC

## 2024-02-29 MED ORDER — IPRATROPIUM-ALBUTEROL 0.5-2.5 (3) MG/3ML IN SOLN
3.0000 mL | Freq: Three times a day (TID) | RESPIRATORY_TRACT | Status: DC
  Administered 2024-02-29 – 2024-03-01 (×3): 3 mL via RESPIRATORY_TRACT
  Filled 2024-02-29 (×3): qty 3

## 2024-02-29 MED ORDER — DM-GUAIFENESIN ER 30-600 MG PO TB12: 1.0000 | ORAL_TABLET | Freq: Two times a day (BID) | ORAL | 0 refills | Status: AC | PRN

## 2024-02-29 MED ORDER — WARFARIN SODIUM 4 MG PO TABS
4.0000 mg | ORAL_TABLET | Freq: Once | ORAL | Status: AC
  Administered 2024-02-29: 4 mg via ORAL
  Filled 2024-02-29: qty 1

## 2024-02-29 NOTE — Discharge Summary (Signed)
 Physician Discharge Summary   Patient: Dawn Sherman MRN: [id number] DOB: [birth year]  Admit date:     02/23/2024  Discharge date: 02/29/24  Discharge Physician: Drue ONEIDA Potter   PCP: Glendia Shad, MD   Recommendations at discharge:  Follow-up with PCP  Discharge Diagnoses:  Acute on chronic hypoxic respiratory failure: In the setting of COPD exacerbation COPD exacerbation:  Rhinovirus infection:  Elevated troponin:  Paroxysmal atrial fibrillation:  Chronic diastolic CHF: Compensated.   CKD stage IV:  Comorbidities include hypertension, hyperlipidemia, hypothyroidism, depression, anxiety  Hospital Course: Dawn Sherman is a 68 y.o. female with medical history significant of  COPD, HTN, HLD, GERD, depression, former smoker, alcohol use disorder in remission, CKD stage IV, chronic diastolic CHF ((used to have sCHF with EF 30-35%, improved to EF 50-60 by 2D Echo on 11/27/23), SDH, PAF on Coumadin , second-degree AV block.  She presented to the hospital with worsening shortness of breath and cough productive of greenish sputum.  Patient was found to have rhinovirus infection.  Requires symptomatic management as well as management for COPD exacerbation.  Patient underwent walk test that showed she needs 2 L of oxygen continuously at home.  She will follow-up with PCP   Consultants: None Procedures performed: None Disposition: Home Diet recommendation:  Cardiac diet DISCHARGE MEDICATION: Allergies as of 02/29/2024       Reactions   Sulfate Rash   Codeine Sulfate Nausea Only   Benicar [olmesartan]    Talked with patient February 10, 2020, intolerance is unclear, tried several medications around that time and one of them gave her a rash but she is not clear which 1.   Amoxicillin Rash   Clindamycin/lincomycin Rash   Entresto  [sacubitril -valsartan ] Other (See Comments)   hyperkalemia   Morphine And Codeine Rash   Penicillins Rash        Medication List     STOP taking these  medications    torsemide  20 MG tablet Commonly known as: DEMADEX        TAKE these medications    acetaminophen  325 MG tablet Commonly known as: TYLENOL  Take 650 mg by mouth every 6 (six) hours as needed (pain.).   albuterol  108 (90 Base) MCG/ACT inhaler Commonly known as: VENTOLIN  HFA Inhale 2 puffs into the lungs every 6 (six) hours as needed for wheezing or shortness of breath.   atorvastatin  40 MG tablet Commonly known as: LIPITOR Take 1 tablet (40 mg total) by mouth daily.   buPROPion  150 MG 24 hr tablet Commonly known as: WELLBUTRIN  XL Take 1 tablet (150 mg total) by mouth daily.   calcitRIOL  0.25 MCG capsule Commonly known as: ROCALTROL  Take 0.25 mcg by mouth daily.   calcium  carbonate 1250 (500 Ca) MG tablet Commonly known as: OS-CAL - dosed in mg of elemental calcium  Take 1 tablet (500 mg of elemental calcium  total) by mouth 3 (three) times daily with meals.   carvedilol  3.125 MG tablet Commonly known as: COREG  Take 1 tablet (3.125 mg total) by mouth 2 (two) times daily.   dextromethorphan -guaiFENesin  30-600 MG 12hr tablet Commonly known as: MUCINEX  DM Take 1 tablet by mouth 2 (two) times daily as needed for up to 5 days for cough.   Farxiga  10 MG Tabs tablet Generic drug: dapagliflozin  propanediol Take 10 mg by mouth daily.   hydrALAZINE  25 MG tablet Commonly known as: APRESOLINE  Take 1 tablet (25 mg total) by mouth 3 (three) times daily.   isosorbide  mononitrate 30 MG 24 hr tablet Commonly known as: IMDUR   Take 1 tablet (30 mg total) by mouth daily.   levothyroxine  75 MCG tablet Commonly known as: SYNTHROID  Take 1 tablet (75 mcg total) by mouth daily before breakfast.   loratadine  10 MG tablet Commonly known as: CLARITIN  Take 10 mg by mouth daily.   omeprazole  20 MG capsule Commonly known as: PRILOSEC Take 1 capsule (20 mg total) by mouth daily.   predniSONE  10 MG tablet Commonly known as: DELTASONE  Take 4 tablets (40 mg total) by mouth  daily for 3 days, THEN 2 tablets (20 mg total) daily for 3 days, THEN 1 tablet (10 mg total) daily for 3 days. Start taking on: February 29, 2024   venlafaxine  XR 150 MG 24 hr capsule Commonly known as: EFFEXOR -XR Take 1 capsule (150 mg total) by mouth daily.   warfarin 2.5 MG tablet Commonly known as: COUMADIN  Take as directed. If you are unsure how to take this medication, talk to your nurse or doctor. Original instructions: Take 2.5 mg by mouth daily. Patient takes 1 1/2 tablet on M,T,TH,F,Sat, Sun.  Takes 2 tablets on Wednesday.               Durable Medical Equipment  (From admission, onward)           Start     Ordered   02/29/24 1415  For home use only DME oxygen  Once       Question Answer Comment  Length of Need 12 Months   Mode or (Route) Nasal cannula   Liters per Minute 2   Frequency Continuous (stationary and portable oxygen unit needed)   Oxygen conserving device Yes   Oxygen delivery system Gas      02/29/24 1414            Discharge Exam: Filed Weights   02/27/24 0423 02/28/24 0500 02/29/24 0500  Weight: 51.8 kg 50.5 kg 51.3 kg   GEN: NAD SKIN: Warm and dry EYES: No pallor or icterus ENT: MMM CV: RRR PULM: Air entry decreased bibasilarly, expiratory wheezing noted ABD: soft, ND, NT, +BS CNS: AAO x 3, non focal EXT: No edema or tenderness    Condition at discharge: good  The results of significant diagnostics from this hospitalization (including imaging, microbiology, ancillary and laboratory) are listed below for reference.   Imaging Studies: CT CHEST LUNG CA SCREEN LOW DOSE W/O CM Result Date: 02/25/2024 CLINICAL DATA:  68 year old female with 25 pack-year history of smoking. Lung cancer screening. EXAM: CT CHEST WITHOUT CONTRAST LOW-DOSE FOR LUNG CANCER SCREENING TECHNIQUE: Multidetector CT imaging of the chest was performed following the standard protocol without IV contrast. RADIATION DOSE REDUCTION: This exam was performed according to  the departmental dose-optimization program which includes automated exposure control, adjustment of the mA and/or kV according to patient size and/or use of iterative reconstruction technique. COMPARISON:  Standard CT chest 09/27/2022 FINDINGS: Cardiovascular: The heart size is normal. No substantial pericardial effusion. Mild atherosclerotic calcification is noted in the wall of the thoracic aorta. Mediastinum/Nodes: No mediastinal lymphadenopathy. No evidence for gross hilar lymphadenopathy although assessment is limited by the lack of intravenous contrast on the current study. The esophagus has normal imaging features. There is no axillary lymphadenopathy. Lungs/Pleura: Centrilobular and paraseptal emphysema evident. Numerous scattered tiny bilateral pulmonary nodules are identified measuring less than 5 mm. No overtly suspicious pulmonary nodule or mass. No focal airspace consolidation. No pleural effusion. Upper Abdomen: Visualized portion of the upper abdomen shows no acute findings. Musculoskeletal: No worrisome lytic or sclerotic osseous abnormality.  Multiple nonacute left rib fractures evident. IMPRESSION: 1. Lung-RADS 2, benign appearance or behavior. Continue annual screening with low-dose chest CT without contrast in 12 months. 2.  Emphysema (ICD10-J43.9) and Aortic Atherosclerosis (ICD10-170.0) Electronically Signed   By: Camellia Candle M.D.   On: 02/25/2024 09:43   DG Chest Portable 1 View Result Date: 02/23/2024 CLINICAL DATA:  Shortness of breath.  COPD. EXAM: PORTABLE CHEST 1 VIEW COMPARISON:  11/20/2022 FINDINGS: The heart is normal in size. Stable mediastinal contours. Chronic hyperinflation and bronchial thickening. No confluent airspace disease. No pneumothorax or significant pleural effusion. Multiple remote left rib fractures. Scoliotic curvature of the spine. IMPRESSION: Chronic hyperinflation and bronchial thickening. No acute findings. Electronically Signed   By: Andrea Gasman M.D.    On: 02/23/2024 18:13    Microbiology: Results for orders placed or performed during the hospital encounter of 02/23/24  Resp panel by RT-PCR (RSV, Flu A&B, Covid) Anterior Nasal Swab     Status: None   Collection Time: 02/23/24  9:30 PM   Specimen: Anterior Nasal Swab  Result Value Ref Range Status   SARS Coronavirus 2 by RT PCR NEGATIVE NEGATIVE Final    Comment: (NOTE) SARS-CoV-2 target nucleic acids are NOT DETECTED.  The SARS-CoV-2 RNA is generally detectable in upper respiratory specimens during the acute phase of infection. The lowest concentration of SARS-CoV-2 viral copies this assay can detect is 138 copies/mL. A negative result does not preclude SARS-Cov-2 infection and should not be used as the sole basis for treatment or other patient management decisions. A negative result may occur with  improper specimen collection/handling, submission of specimen other than nasopharyngeal swab, presence of viral mutation(s) within the areas targeted by this assay, and inadequate number of viral copies(<138 copies/mL). A negative result must be combined with clinical observations, patient history, and epidemiological information. The expected result is Negative.  Fact Sheet for Patients:  BloggerCourse.com  Fact Sheet for Healthcare Providers:  SeriousBroker.it  This test is no t yet approved or cleared by the [place]  FDA and  has been authorized for detection and/or diagnosis of SARS-CoV-2 by FDA under an Emergency Use Authorization (EUA). This EUA will remain  in effect (meaning this test can be used) for the duration of the COVID-19 declaration under Section 564(b)(1) of the Act, 21 U.S.C.section 360bbb-3(b)(1), unless the authorization is terminated  or revoked sooner.       Influenza A by PCR NEGATIVE NEGATIVE Final   Influenza B by PCR NEGATIVE NEGATIVE Final    Comment: (NOTE) The Xpert Xpress SARS-CoV-2/FLU/RSV  plus assay is intended as an aid in the diagnosis of influenza from Nasopharyngeal swab specimens and should not be used as a sole basis for treatment. Nasal washings and aspirates are unacceptable for Xpert Xpress SARS-CoV-2/FLU/RSV testing.  Fact Sheet for Patients: BloggerCourse.com  Fact Sheet for Healthcare Providers: SeriousBroker.it  This test is not yet approved or cleared by the [place]  FDA and has been authorized for detection and/or diagnosis of SARS-CoV-2 by FDA under an Emergency Use Authorization (EUA). This EUA will remain in effect (meaning this test can be used) for the duration of the COVID-19 declaration under Section 564(b)(1) of the Act, 21 U.S.C. section 360bbb-3(b)(1), unless the authorization is terminated or revoked.     Resp Syncytial Virus by PCR NEGATIVE NEGATIVE Final    Comment: (NOTE) Fact Sheet for Patients: BloggerCourse.com  Fact Sheet for Healthcare Providers: SeriousBroker.it  This test is not yet approved or cleared by the [place]  FDA  and has been authorized for detection and/or diagnosis of SARS-CoV-2 by FDA under an Emergency Use Authorization (EUA). This EUA will remain in effect (meaning this test can be used) for the duration of the COVID-19 declaration under Section 564(b)(1) of the Act, 21 U.S.C. section 360bbb-3(b)(1), unless the authorization is terminated or revoked.  Performed at [facility], [address] [place], [place] [address]   Expectorated Sputum Assessment w Gram Stain, Rflx to Resp Cult     Status: None   Collection Time: 02/24/24  5:02 AM   Specimen: Sputum  Result Value Ref Range Status   Specimen Description SPUTUM  Final   Special Requests NONE  Final   Sputum evaluation   Final    THIS SPECIMEN IS ACCEPTABLE FOR SPUTUM CULTURE Performed at [facility], [address],  [place], [place] [address]    Report Status 02/24/2024 FINAL  Final  Culture, Respiratory w Gram Stain     Status: None   Collection Time: 02/24/24  5:02 AM   Specimen: SPU  Result Value Ref Range Status   Specimen Description   Final    SPUTUM Performed at [facility], [address] [place], [place] [address]    Special Requests   Final    NONE Reflexed from [id number] Performed at [facility], [address] [place], [place] [address]    Gram Stain   Final    MODERATE WBC PRESENT, PREDOMINANTLY PMN ABUNDANT GRAM POSITIVE COCCI IN CHAINS FEW GRAM NEGATIVE RODS FEW GRAM NEGATIVE COCCOBACILLI    Culture   Final    ABUNDANT Normal respiratory flora-no Staph aureus or Pseudomonas seen Performed at [facility], [address] [place], [place] [address]    Report Status 02/26/2024 FINAL  Final  Respiratory (~20 pathogens) panel by PCR     Status: Abnormal   Collection Time: 02/24/24 12:00 PM   Specimen: Nasopharyngeal Swab; Respiratory  Result Value Ref Range Status   Adenovirus NOT DETECTED NOT DETECTED Final   Coronavirus 229E NOT DETECTED NOT DETECTED Final    Comment: (NOTE) The Coronavirus on the Respiratory Panel, DOES NOT test for the novel  Coronavirus (2019 nCoV)    Coronavirus HKU1 NOT DETECTED NOT DETECTED Final   Coronavirus NL63 NOT DETECTED NOT DETECTED Final   Coronavirus OC43 NOT DETECTED NOT DETECTED Final   Metapneumovirus NOT DETECTED NOT DETECTED Final   Rhinovirus / Enterovirus DETECTED (A) NOT DETECTED Final   Influenza A NOT DETECTED NOT DETECTED Final   Influenza B NOT DETECTED NOT DETECTED Final   Parainfluenza Virus 1 NOT DETECTED NOT DETECTED Final   Parainfluenza Virus 2 NOT DETECTED NOT DETECTED Final   Parainfluenza Virus 3 NOT DETECTED NOT DETECTED Final   Parainfluenza Virus 4 NOT DETECTED NOT DETECTED Final   Respiratory Syncytial Virus NOT DETECTED NOT DETECTED Final   Bordetella pertussis NOT DETECTED NOT DETECTED  Final   Bordetella Parapertussis NOT DETECTED NOT DETECTED Final   Chlamydophila pneumoniae NOT DETECTED NOT DETECTED Final   Mycoplasma pneumoniae NOT DETECTED NOT DETECTED Final    Comment: Performed at [facility], [address] [place], [place] [address]    Labs: CBC: Recent Labs  Lab 02/24/24 0503 02/25/24 0327 02/27/24 0232 02/28/24 0248 02/29/24 0430  WBC 15.2* 17.1* 13.3* 18.8* 17.6*  NEUTROABS  --   --  11.6* 15.8* 15.5*  HGB 14.5 14.1 13.4 14.5 13.1  HCT 44.9 42.3 41.6 43.7 39.5  MCV 94.5 92.0  93.5 93.4 92.7  PLT 115* 120* 123* 160 164   Basic Metabolic Panel: Recent Labs  Lab 02/24/24 0503 02/25/24 0327 02/27/24 0232 02/28/24 0248 02/29/24 0430  NA 137 135 138 137 133*  K 4.6 4.5 4.8 4.9 5.3*  CL 97* 99 105 104 103  CO2 27 25 25 23 22   GLUCOSE 133* 131* 95 82 123*  BUN 49* 72* 86* 86* 83*  CREATININE 2.93* 3.20* 2.80* 2.60* 2.58*  CALCIUM  8.6* 8.6* 9.0 9.7 9.0   Liver Function Tests: Recent Labs  Lab 02/23/24 1720  AST 16  ALT 13  ALKPHOS 84  BILITOT 0.8  PROT 6.4*  ALBUMIN  3.6   CBG: No results for input(s): GLUCAP in the last 168 hours. Discharge time spent:  38 minutes.  Signed: Drue ONEIDA Potter, MD [facility] 02/29/2024

## 2024-02-29 NOTE — Progress Notes (Signed)
 Patient AOX4. Sitting at edge of bed, dressed. Discharge order in at 1418. AVS given to patient, PIV x1 removed. Belongings packed. Was made aware that patient's husband is here outside waiting in car (~14:30). Patient waiting for DME temp oxygen tank to be delivered.  TOC signed off at 1425.    Now 1900, no portable O2 delivered. Husband is upset for waiting outside this long as he did not want to drive in storm. Patient's ride headed home for the night. Patient states she will stay the night. Made MD aware.

## 2024-02-29 NOTE — Progress Notes (Addendum)
 Patient AOX4. Agreeable to do walk test. Currently on 2LNC 94% , HR92 at rest.   Ambulatory Pulse Oximetry:   02/29/24 1103 02/29/24 1105 02/29/24 1108  Vitals  ECG Heart Rate 91 (!) 103 99  MEWS COLOR  MEWS Score Color Green Green Green  Oxygen Therapy  SpO2 90 % 92 % 94 %  O2 Device Nasal Cannula  Nasal Cannula  --   O2 Flow Rate (L/min) 2 L/min 2 L/min 2 L/min  Patient Activity (if Appropriate) Ambulating Ambulating Ambulating   Patient walked without oxygen for 3 min lowest O2 observed was 87% on room air at rest. Patient stated she felt woozy. And returned back to bed. Patient remained 88% on room air. Placed O2  2L back on patient  and O2 back up to 92%    Test completed.

## 2024-02-29 NOTE — Plan of Care (Signed)
?  Problem: Clinical Measurements: ?Goal: Ability to maintain clinical measurements within normal limits will improve ?Outcome: Progressing ?  ?Problem: Activity: ?Goal: Risk for activity intolerance will decrease ?Outcome: Progressing ?  ?Problem: Elimination: ?Goal: Will not experience complications related to urinary retention ?Outcome: Progressing ?  ?

## 2024-02-29 NOTE — TOC Progression Note (Signed)
 Transition of Care [facility] - Progression Note    Patient Details  Name: Dawn Sherman MRN: [id number] Date of Birth: August 12, 1956  Transition of Care [facility]) CM/SW Contact  Delphine KANDICE Bring, RN Phone Number: 02/29/2024, 7:50 PM  Clinical Narrative:     CM supervisor called after hours supervisior, Ms. Quintin. She states that she follow up and call supervisor back   [phone] Supervisor received a call from driver Dominque from [employer]. He states that he just received the order. He states that he is in middle of setting up DME for another patient. He states that patient is next. CM updated nurse.     2010 Cm received a call from driver that patient's oxygen was delivered  Expected Discharge Plan: Home/Self Care Barriers to Discharge: No Barriers Identified  Expected Discharge Plan and Services         Expected Discharge Date: 02/29/24                                     Social Determinants of Health (SDOH) Interventions SDOH Screenings   Food Insecurity: No Food Insecurity (02/24/2024)  Housing: Low Risk  (02/24/2024)  Transportation Needs: No Transportation Needs (02/24/2024)  Utilities: Not At Risk (02/24/2024)  Alcohol Screen: Low Risk  (12/15/2023)  Depression (PHQ2-9): Low Risk  (12/24/2023)  Financial Resource Strain: Low Risk  (12/15/2023)  Physical Activity: Inactive (12/15/2023)  Social Connections: Socially Integrated (02/24/2024)  Stress: No Stress Concern Present (12/15/2023)  Tobacco Use: Medium Risk (02/23/2024)  Health Literacy: Adequate Health Literacy (12/16/2023)    Readmission Risk Interventions    02/25/2024   11:54 AM 07/02/2022   12:26 PM 12/11/2021    3:17 PM  Readmission Risk Prevention Plan  Transportation Screening Complete Complete Complete  PCP or Specialist Appt within 3-5 Days Complete  Complete  HRI or Home Care Consult   Complete  Social Work Consult for Recovery Care Planning/Counseling Complete  Complete  Palliative Care Screening Not Applicable  Not  Applicable  Medication Review Oceanographer) Complete Complete Complete  PCP or Specialist appointment within 3-5 days of discharge  Complete   SW Recovery Care/Counseling Consult  Complete   Palliative Care Screening  Not Applicable   Skilled Nursing Facility  Not Applicable

## 2024-02-29 NOTE — TOC Progression Note (Signed)
 Transition of Care [facility]) - Progression Note    Patient Details  Name: Dawn Sherman MRN: [id number] Date of Birth: 1956-07-16  Transition of Care [facility]) CM/SW Contact  Lorraine LILLETTE Fenton, [place] Phone Number: 02/29/2024, 2:30 PM  Clinical Narrative:    Pt DC with home oxygen need, CSW contacted spouse dvised ordering home Oxygen and [employer] preferred provider. Advised they will meet pt at hospital with a temporary tank, they schedule set up at home.  TOC signing off.    Expected Discharge Plan: Home/Self Care Barriers to Discharge: No Barriers Identified  Expected Discharge Plan and Services         Expected Discharge Date: 02/29/24                                     Social Determinants of Health (SDOH) Interventions SDOH Screenings   Food Insecurity: No Food Insecurity (02/24/2024)  Housing: Low Risk  (02/24/2024)  Transportation Needs: No Transportation Needs (02/24/2024)  Utilities: Not At Risk (02/24/2024)  Alcohol Screen: Low Risk  (12/15/2023)  Depression (PHQ2-9): Low Risk  (12/24/2023)  Financial Resource Strain: Low Risk  (12/15/2023)  Physical Activity: Inactive (12/15/2023)  Social Connections: Socially Integrated (02/24/2024)  Stress: No Stress Concern Present (12/15/2023)  Tobacco Use: Medium Risk (02/23/2024)  Health Literacy: Adequate Health Literacy (12/16/2023)    Readmission Risk Interventions    02/25/2024   11:54 AM 07/02/2022   12:26 PM 12/11/2021    3:17 PM  Readmission Risk Prevention Plan  Transportation Screening Complete Complete Complete  PCP or Specialist Appt within 3-5 Days Complete  Complete  HRI or Home Care Consult   Complete  Social Work Consult for Recovery Care Planning/Counseling Complete  Complete  Palliative Care Screening Not Applicable  Not Applicable  Medication Review Oceanographer) Complete Complete Complete  PCP or Specialist appointment within 3-5 days of discharge  Complete   SW Recovery Care/Counseling Consult  Complete    Palliative Care Screening  Not Applicable   Skilled Nursing Facility  Not Applicable

## 2024-02-29 NOTE — Progress Notes (Signed)
 PHARMACY - ANTICOAGULATION CONSULT NOTE  Pharmacy Consult for warfarin dosage adjustment Indication: atrial fibrillation  Allergies  Allergen Reactions   Sulfate Rash   Codeine Sulfate Nausea Only   Benicar [Olmesartan]     Talked with patient February 10, 2020, intolerance is unclear, tried several medications around that time and one of them gave her a rash but she is not clear which 1.   Amoxicillin Rash   Clindamycin/Lincomycin Rash   Entresto  [Sacubitril -Valsartan ] Other (See Comments)    hyperkalemia   Morphine And Codeine Rash   Penicillins Rash    Patient Measurements: Height: 5' 5 (165.1 cm) Weight: 51.3 kg (113 lb 1.5 oz) IBW/kg (Calculated) : 57 HEPARIN  DW (KG): 51.6  Vital Signs: Temp: 97.9 F (36.6 C) (07/06 0734) Temp Source: Oral (07/06 0734) BP: 148/72 (07/06 0734) Pulse Rate: 94 (07/06 0734)  Labs: Recent Labs    02/27/24 0232 02/28/24 0248 02/29/24 0430  HGB 13.4 14.5 13.1  HCT 41.6 43.7 39.5  PLT 123* 160 164  LABPROT 17.1* 18.1* 21.4*  INR 1.3* 1.4* 1.8*  CREATININE 2.80* 2.60* 2.58*    Estimated Creatinine Clearance: 16.9 mL/min (A) (by C-G formula based on SCr of 2.58 mg/dL (H)).   Medical History: Past Medical History:  Diagnosis Date   B12 deficiency    Cardiomyopathy (HCC)    a. 09/2022 Echo: EF 35-40%; b. 09/2022 MV: apical defect w/ mild peri-infarct ischemia. EF 43%.   Chronic HFrEF (heart failure with reduced ejection fraction) (HCC)    a. 01/2020 Echo: EF 30-35%; b. 07/2020 Echo: EF 50-55%; c. 06/2022 Echo: EF 50-55%; d. 09/2022 Echo: EF 35-40%, globh HK, mod reduced RV fxn, sev BAE, mod MR.   CKD (chronic kidney disease), stage IV (HCC)    COPD (chronic obstructive pulmonary disease) (HCC)    Depression    Endometriosis    Hypertension    Mixed hyperlipidemia    Moderate mitral regurgitation    Normocytic anemia    PAF (paroxysmal atrial fibrillation) (HCC)    a. CHA2DS2VASc = 4-->eliquis  2.5 bid.   Paroxymsal Atrial flutter  (HCC)    Tobacco abuse     Medications:  Scheduled:   atorvastatin   40 mg Oral Daily   buPROPion   150 mg Oral Daily   calcitRIOL   0.25 mcg Oral Daily   calcium  carbonate  1 tablet Oral TID WC   carvedilol   3.125 mg Oral BID WC   hydrALAZINE   25 mg Oral TID   ipratropium-albuterol   3 mL Nebulization TID   isosorbide  mononitrate  30 mg Oral Daily   levothyroxine   75 mcg Oral Q0600   loratadine   10 mg Oral Daily   methylPREDNISolone  (SOLU-MEDROL ) injection  80 mg Intravenous Daily   pantoprazole   40 mg Oral Daily   venlafaxine  XR  150 mg Oral Daily   Warfarin - Pharmacist Dosing Inpatient   Does not apply q1600   PRN: dextromethorphan -guaiFENesin , ipratropium-albuterol   Assessment: 68 YO female with Afib requiring warfarin for anticoagulation. Notable PMH of HTN, diastolic HF, CKD, hypothyroidism, tobacco use disorder, and COPD exacerbation. CHAD-VASC score of 3. Hemoglobin stable at 14.1 (from 7/2). No new drug interactions since dose yesterday. Interactions with levothyroxine  (increased INR), venlafaxine  (increased bleeding risk), and doxycycline  (increased INR), and steroids (increase INR). At home, takes 5 mg on Wednesdays and 3.75 mg all other days (27.5 mg weekly). Most recent INR is subtherapeutic and continued downwards trend, but is likely to increase since she was given 5 mg yesterday and will be  given 6 mg today. Not indicated for enoxaparin  bridge given current CHAD-VASC score and likely upwards trend of INR.   Date INR Warfarin Dose  7/1 4.1  HELD  7/2 2.1  5 mg   7/3 1.1 6 mg  7/4 1.3 5 mg  7/5 1.4 5 mg  7/6 1.8          Goal of Therapy:  INR 2-3 Monitor platelets by anticoagulation protocol: Yes   Plan:  INR subtherapeutic but increasing. Will order warfarin 4 mg x 1   Predict INR will continue to trend up tomorrow. Pt is on steroids which may effect INR. Daily INR. CBC at least every 3 days. And 7 days once stable.    Discharge recommendation: restart home  regimen (5 mg on Wednesdays and 3.75 mg all other days (27.5 mg weekly))  Riane Rung A, PharmD 02/29/2024,8:22 AM

## 2024-03-01 ENCOUNTER — Other Ambulatory Visit: Payer: Self-pay

## 2024-03-01 LAB — PROTIME-INR
INR: 2 — ABNORMAL HIGH (ref 0.8–1.2)
Prothrombin Time: 23.4 s — ABNORMAL HIGH (ref 11.4–15.2)

## 2024-03-01 NOTE — Progress Notes (Signed)
 0915: Home 02 delivered and at the bedside with the patient. No PIV's present. Discharge instructions complete and reviewed with patient. Patient refuses am meds and states she will take her am meds when she gets home. Patient's husband to transport patient to home via car. Hospital transporter to take patient to main hospital entrance via wheelchair for patient pick-up. VSS. No distress.

## 2024-03-01 NOTE — Progress Notes (Signed)
 Entered room and introduced myself as RN who helps with admissions and discharges. Patient is in bed with DME- oxygen at bedside. Patient states o2 was delivered around 2030 last night (7/6).   Explained to patient still has discharge orders and is free to go when her ride gets here. Patient states she is ready to go, and her husband will be here to pick her up after breakfast. IVs are already removed; patient states she has no needs at this time and is waiting on husband. Instructed to press call bell when her husband is close and we will wheel her down with her belongings.

## 2024-03-01 NOTE — Patient Instructions (Signed)
 Visit Information  Thank you for taking time to visit with me today. Please don't hesitate to contact me if I can be of assistance to you before our next scheduled appointment.  Your next care management appointment is by telephone on 03/30/2024 at 2:00 pm  Telephone follow-up in 1 month  Please call the care guide team at [phone] if you need to cancel, schedule, or reschedule an appointment.   Please call the Suicide and Crisis Lifeline: 988 call the [place]  National Suicide Prevention Lifeline: (814)663-1802 or TTY: 567-359-8276 TTY (954)605-0780) to talk to a trained counselor call 1-800-273-TALK (toll free, 24 hour hotline) go to [facility] [address] [place] [phone]) call 911 if you are experiencing a Mental Health or Behavioral Health Crisis or need someone to talk to.  Nestora Duos, MSN, RN [place]  [facility], [facility] RN Care Manager Direct Dial: [phone] Fax: [phone]  Heart Failure Action Plan A heart failure action plan helps you know what to do when you have symptoms of heart failure. Your action plan is a color-coded plan that lists the symptoms to watch for and indicates what actions to take. If you have symptoms in the green zone, you're doing well. If you have symptoms in the yellow zone, you're having problems. If you have symptoms in the red zone, you need medical care right away. Follow the plan that was created by you and your health care provider. Review your plan each time you visit your provider. Green zone These signs mean you're doing well and can continue what you're doing: You don't have new or worsening shortness of breath. You have very little swelling or no new swelling. Your weight is stable (no gain or loss). You have a normal activity level. You don't have chest pain or any other new symptoms. Yellow zone These signs and symptoms mean your condition may be  getting worse and you should make some changes: You have trouble breathing when you're active. You have swelling in your feet or legs or have discomfort in your belly. You gain 2-3 lb (0.9-1.4 kg) in 24 hours, or 5 lb (2.3 kg) in a week. This amount may be more or less depending on your condition. You get tired easily. You have trouble sleeping. You have a dry cough. If you have any of these symptoms: Contact your provider within the next day. Your provider may adjust your medicines. Red zone These signs and symptoms mean you should get medical help right away: You have trouble breathing when resting or cannot lie flat and you need to raise your head to help you breathe. You have a dry cough that's getting worse. You have swelling or pain in your feet or legs or discomfort in your belly that's getting worse. You suddenly gain more than 2-3 lb (0.9-1.4 kg) in 24 hours, or more than 5 lb (2.3 kg) in a week. This amount may be more or less depending on your condition. You have trouble staying awake or you feel confused. You don't have an appetite. You have worsening sadness or depression. These symptoms may be an emergency. Call 911 right away. Do not wait to see if the symptoms will go away. Do not drive yourself to the hospital. Follow these instructions at home: Take medicines only as told. Eat a heart-healthy diet. Work with a dietitian to create an eating plan that's best for you. Weigh yourself each day. Your target weight is __________ lb (__________ kg).  Call your provider if you gain more than __________ lb (__________ kg) in 24 hours, or more than __________ lb (__________ kg) in a week. Health care provider name: _____________________________________________________ Health care provider phone number: _____________________________________________________ Where to find more information American Heart Association: heart.org This information is not intended to replace advice given  to you by your health care provider. Make sure you discuss any questions you have with your health care provider. Document Revised: 03/27/2023 Document Reviewed: 03/27/2023 Elsevier Patient Education  2024 Elsevier Inc.  Home Oxygen Use, Adult When a medical condition keeps you from getting enough oxygen, your health care provider may have you use extra oxygen at home. Your health care provider will let you know: When to use oxygen. How much oxygen to use. The amount is set in liters per minute (LPM or L/M). How long to use oxygen. Home oxygen can be given through: A mask. This covers the nose and mouth or covers a tracheotomy tube. A nasal cannula. This is a device or tube that goes in the nostrils. A transtracheal catheter. This is a small, thin tube placed through the neck and into the windpipe (trachea). A breathing tube (tracheostomy tube) that is surgically placed in the windpipe. These devices have tubing that connects to an oxygen source, such as: A tank. Tanks hold oxygen in gas form. The tank must be replaced when the oxygen is used up. A liquid oxygen device. This holds oxygen in liquid form. This must be replaced when the oxygen is used up. An oxygen concentrator machine. This filters oxygen in the room. It uses electricity so you must have a backup oxygen tank in case the power goes out. Work with your health care provider to find equipment that works best for you and your lifestyle. What are the risks? Your health care provider will talk with you about risks. These may include: Fire. This can happen if the oxygen is exposed to a heat source, flame, or spark. Injury to the skin. This can happen if liquid oxygen touches the skin. Pressure sores may occur if the oxygen tubing presses on the skin. Damage to the lungs or other organs. This can happen from getting too little or too much oxygen. Supplies needed: To use oxygen, you will need: A mask, nasal cannula, transtracheal  catheter, or tracheostomy. An oxygen tank, a liquid oxygen device, or an oxygen concentrator. Your health care provider may recommend: A humidifier. This device adds moisture to the oxygen. A pulse oximeter. This device measures the percentage of oxygen in your blood. How to use oxygen You will be shown how to use your oxygen device. Follow the instructions, which may look something like this: Wash your hands with soap and water  for at least 20 seconds. Turn on the oxygen. Make sure the oxygen unit is working right. To do this: Place the end of the oxygen tubing in a cup of water  before connecting it to the nasal cannula, mask, or transtracheal catheter. The water  will bubble if oxygen is flowing. Place one end of the oxygen tubing into the port on the tank, device, or machine. Connect the other end to the nasal cannula, mask, or transtracheal catheter. Turn the liter-flow setting on the machine to the level you are told. Place the nasal cannula in your nose, or place the mask over your mouth and nose or tracheostomy tube. Turn off the oxygen when you are not using it. How to clean and care for the oxygen supplies Clean or  replace your oxygen equipment and supplies as told by the medical device company that supplies the equipment. Safety tips Fire safety tips  Keep your oxygen and oxygen supplies at least 6 ft (2 m) away from sources of heat, flames, and sparks at all times. Do not allow smoking near your oxygen. Put up no smoking signs in your home. Avoid smoking areas in public. Do not use materials that can burn (are flammable) while you use oxygen. This includes: Petroleum jelly. Hand sanitizer. Rubbing alcohol. Hair spray or other aerosol sprays. Keep a Government social research officer nearby. Tell your fire department that you have oxygen in your home. Test your home smoke detectors often. Traveling Secure your oxygen tank in the vehicle so that it does not move. Follow instructions from your  medical device company about how to safely secure your tank. Have enough oxygen for the amount of time you will be away from home. If you plan to travel by public transportation such as airplane, train, bus, or boat, contact the company to arrange a portable oxygen delivery system. You may need documents from your health care provider and medical device company before you travel. General safety tips Keep extra supplies on hand, including extra tubing and an extra cannula or mask. If you use an oxygen cylinder, keep it in a stand or secure it to an object that will not move. If you use liquid oxygen, always keep the container upright. If you use an oxygen concentrator: Tell Museum/gallery curator company. Make sure you are given priority service if your power goes out. Avoid using extension cords if possible. Keep an extra supply of backup oxygen tanks. Follow these instructions at home: Use oxygen only as told by your health care provider. Do not use alcohol or other drugs that make you relax (sedating drugs) unless told. They can slow down your breathing rate and make it hard to get in enough oxygen. Know how and when to order a refill of oxygen. Plan for holidays when you may not be able to get a prescription filled. Use water -based lubricants on your lips or nostrils. Do not use oil-based products like petroleum jelly. Ask your health care provider how to prevent skin irritation on your cheeks or behind your ears. Contact a health care provider if: You are more tired than normal and have little energy. You have dry or irritated skin in your nose or on your face. You have nosebleeds. You are restless, irritable, or anxious. You get headaches often. You are not sleeping well. Get help right away if: You have trouble breathing. You are confused. You are sleepy all the time. You have blue lips or fingernails. These symptoms may be an emergency. Get help right away. Call 911. Do not wait to see if  the symptoms will go away. Do not drive yourself to the hospital. This information is not intended to replace advice given to you by your health care provider. Make sure you discuss any questions you have with your health care provider. Document Revised: 03/12/2022 Document Reviewed: 03/12/2022 Elsevier Patient Education  2024 Elsevier Inc.  COPD Action Plan A COPD action plan is a description of what to do when you have a flare (exacerbation) of chronic obstructive pulmonary disease (COPD). Your action plan is a color-coded plan that lists the symptoms that indicate whether your condition is under control and what actions to take. If you have symptoms in the green zone, it means you are doing well that day. If you have  symptoms in the yellow zone, it means you are having a bad day or an exacerbation. If you have symptoms in the red zone, you need urgent medical care. Follow the plan that you and your health care provider developed. Review your plan with your health care provider at each visit. Red zone Symptoms in this zone mean that you should get medical help right away. They include: Feeling very short of breath, even when you are resting. Not being able to do any activities because of poor breathing. Not being able to sleep because of poor breathing. Fever or shaking chills. Feeling confused or very sleepy. Chest pain. Coughing up blood. If you have any of these symptoms, call emergency services (911 in the [place] or go to the nearest emergency room. Yellow zone Symptoms in this zone mean that your condition may be getting worse. They include: Feeling more short of breath than usual. Having less energy for daily activities than usual. Phlegm or mucus that is thicker than usual. Needing to use your rescue inhaler or nebulizer more often than usual. More ankle swelling than usual. Coughing more than usual. Feeling like you have a chest cold. Trouble sleeping due to COPD  symptoms. Decreased appetite. COPD medicines not helping as much as usual. If you experience any yellow symptoms: Keep taking your daily medicines as directed. Use your quick-relief inhaler as told by your health care provider. If you were prescribed steroid medicine to take by mouth (oral medicine), start taking it as told by your health care provider. If you were prescribed an antibiotic medicine, start taking it as told by your health care provider. Do not stop taking the antibiotic even if you start to feel better. Use oxygen as told by your health care provider. Get more rest. Do your pursed-lip breathing exercises. Do not smoke. Avoid any irritants in the air. If your signs and symptoms do not improve after taking these steps, call your health care provider right away. Green zone Symptoms in this zone mean that you are doing well. They include: Being able to do your usual activities and exercise. Having the usual amount of coughing, including the same amount of phlegm or mucus. Being able to sleep well. Having a good appetite. Where to find more information: You can find more information about COPD from: American Lung Association, My COPD Action Plan: www.lung.org COPD Foundation: www.copdfoundation.org National Heart, Lung, & Blood Institute: [employer]is Follow these instructions at home: Continue taking your daily medicines as told by your health care provider. Make sure you receive all the immunizations that your health care provider recommends, especially the pneumococcal and influenza vaccines. Wash your hands often with soap and water . Have family members wash their hands too. Regular hand washing can help prevent infections. Follow your usual exercise and diet plan. Avoid irritants in the air, such as smoke. Do not use any products that contain nicotine  or tobacco. These products include cigarettes, chewing tobacco, and vaping devices, such as e-cigarettes. If you  need help quitting, ask your health care provider. Summary A COPD action plan tells you what to do when you have a flare (exacerbation) of chronic obstructive pulmonary disease (COPD). Follow each action plan for your symptoms. If you have any symptoms in the red zone, call emergency services (911 in the [place] or go to the nearest emergency room. This information is not intended to replace advice given to you by your health care provider. Make sure you discuss any questions you have with your health  care provider. Document Revised: 06/26/2023 Document Reviewed: 06/26/2023 Elsevier Patient Education  2024 [employer].

## 2024-03-01 NOTE — Progress Notes (Addendum)
 PHARMACY - ANTICOAGULATION CONSULT NOTE  Pharmacy Consult for warfarin dosage adjustment  Indication: atrial fibrillation  Allergies  Allergen Reactions   Sulfate Rash   Codeine Sulfate Nausea Only   Benicar [Olmesartan]     Talked with patient February 10, 2020, intolerance is unclear, tried several medications around that time and one of them gave her a rash but she is not clear which 1.   Amoxicillin Rash   Clindamycin/Lincomycin Rash   Entresto  [Sacubitril -Valsartan ] Other (See Comments)    hyperkalemia   Morphine And Codeine Rash   Penicillins Rash    Patient Measurements: Height: 5' 5 (165.1 cm) Weight: 51.8 kg (114 lb 3.2 oz) IBW/kg (Calculated) : 57 HEPARIN  DW (KG): 51.6  Vital Signs: Temp: 97.7 F (36.5 C) (07/07 0539) BP: 164/80 (07/07 0539) Pulse Rate: 83 (07/07 0539)  Labs: Recent Labs    02/28/24 0248 02/29/24 0430 03/01/24 0443  HGB 14.5 13.1  --   HCT 43.7 39.5  --   PLT 160 164  --   LABPROT 18.1* 21.4* 23.4*  INR 1.4* 1.8* 2.0*  CREATININE 2.60* 2.58*  --     Estimated Creatinine Clearance: 17.1 mL/min (A) (by C-G formula based on SCr of 2.58 mg/dL (H)).   Medical History: Past Medical History:  Diagnosis Date   B12 deficiency    Cardiomyopathy (HCC)    a. 09/2022 Echo: EF 35-40%; b. 09/2022 MV: apical defect w/ mild peri-infarct ischemia. EF 43%.   Chronic HFrEF (heart failure with reduced ejection fraction) (HCC)    a. 01/2020 Echo: EF 30-35%; b. 07/2020 Echo: EF 50-55%; c. 06/2022 Echo: EF 50-55%; d. 09/2022 Echo: EF 35-40%, globh HK, mod reduced RV fxn, sev BAE, mod MR.   CKD (chronic kidney disease), stage IV (HCC)    COPD (chronic obstructive pulmonary disease) (HCC)    Depression    Endometriosis    Hypertension    Mixed hyperlipidemia    Moderate mitral regurgitation    Normocytic anemia    PAF (paroxysmal atrial fibrillation) (HCC)    a. CHA2DS2VASc = 4-->eliquis  2.5 bid.   Paroxymsal Atrial flutter (HCC)    Tobacco abuse      Medications:  Scheduled:   atorvastatin   40 mg Oral Daily   buPROPion   150 mg Oral Daily   calcitRIOL   0.25 mcg Oral Daily   calcium  carbonate  1 tablet Oral TID WC   carvedilol   3.125 mg Oral BID WC   hydrALAZINE   25 mg Oral TID   ipratropium-albuterol   3 mL Nebulization TID   isosorbide  mononitrate  30 mg Oral Daily   levothyroxine   75 mcg Oral Q0600   loratadine   10 mg Oral Daily   methylPREDNISolone  (SOLU-MEDROL ) injection  80 mg Intravenous Daily   pantoprazole   40 mg Oral Daily   venlafaxine  XR  150 mg Oral Daily   Warfarin - Pharmacist Dosing Inpatient   Does not apply q1600   PRN: dextromethorphan -guaiFENesin , ipratropium-albuterol   Assessment: 68 YO female currently admitted for COPD exacerbation due to rhinovirus infection requiring warfarin for anticoagulation. Patient likely to be discharged today. Notable PMH HTN, diastolic HF, CKD, hypothyroidism, tobacco use disorder. CHAD-VASC score of 3. Hemoglobin stable at 13.1 (7/6). No new interactions since yesterday, but notable ones with levothyroxine  (increase INR), venlafaxine  (increased bleeding risk), and methylprednisolone  (increase INR). At home, takes 5 mg on Wednesdays and 3.75 mg all other days (27.5 mg weekly). Most recent INR is on lower end of therapeutic and continues slight upwards trend. Level likely  to remain stable since has received similar warfarin doses recently (4 mg - 7/6, 5 mg 7/5, 5 mg - 7/4, 6 mg - 7/3).    Date INR Warfarin Dose  7/1 4.1  HELD  7/2 2.1  5 mg   7/3 1.1 6 mg  7/4 1.3 5 mg  7/5 1.4 5 mg  7/6 1.8 4 mg    7/7  2.0       Goal of Therapy:  INR 2-3 - re-measure at 1400 if patient still admitted  Monitor platelets by anticoagulation protocol: Yes   Plan:  Since patient is likely to be discharged today, will administer home dosage and recommend for follow-up with PCP. Therefore, will give 3.75 mg today. If patient remains in hospital, will continue to monitor INR and platelets daily.    Sheppard Luckenbach [place] - PharmD Candidate 03/01/2024,7:39 AM

## 2024-03-01 NOTE — Patient Outreach (Signed)
 Complex Care Management   Visit Note  03/01/2024  Name:  Dawn Sherman MRN: [id number] DOB: [birth year]  Situation: Referral received for Complex Care Management related to Heart Failure and COPD I obtained verbal consent from Patient.  Visit completed with patient  on the phone  Background:   Past Medical History:  Diagnosis Date   B12 deficiency    Cardiomyopathy (HCC)    a. 09/2022 Echo: EF 35-40%; b. 09/2022 MV: apical defect w/ mild peri-infarct ischemia. EF 43%.   Chronic HFrEF (heart failure with reduced ejection fraction) (HCC)    a. 01/2020 Echo: EF 30-35%; b. 07/2020 Echo: EF 50-55%; c. 06/2022 Echo: EF 50-55%; d. 09/2022 Echo: EF 35-40%, globh HK, mod reduced RV fxn, sev BAE, mod MR.   CKD (chronic kidney disease), stage IV (HCC)    COPD (chronic obstructive pulmonary disease) (HCC)    Depression    Endometriosis    Hypertension    Mixed hyperlipidemia    Moderate mitral regurgitation    Normocytic anemia    PAF (paroxysmal atrial fibrillation) (HCC)    a. CHA2DS2VASc = 4-->eliquis  2.5 bid.   Paroxymsal Atrial flutter (HCC)    Tobacco abuse     Assessment: Patient Reported Symptoms:  Cognitive Cognitive Status: Alert and oriented to person, place, and time, Normal speech and language skills, Insightful and able to interpret abstract concepts   Health Maintenance Behaviors: Annual physical exam, Immunizations Healing Pattern: Average  Neurological      HEENT HEENT Symptoms Reported: Nasal discharge HEENT Comment: bloody discharge from nose states related to O2    Cardiovascular Cardiovascular Symptoms Reported: No symptoms reported Does patient have uncontrolled Hypertension?: No Cardiovascular Management Strategies: Medication therapy, Routine screening, Diet modification Cardiovascular Comment: No HF symptoms reported  Respiratory Respiratory Symptoms Reported: Productive cough, Shortness of breath Other Respiratory Symptoms: green/brown mucous, taking mucinex   regularly, prednisone  dose pack Respiratory Management Strategies: Activity, Adequate rest, Oxygen therapy, Medication therapy Respiratory Self-Management Outcome: 3 (uncertain)  Endocrine Endocrine Symptoms Reported: No symptoms reported Is patient diabetic?: No    Gastrointestinal Gastrointestinal Symptoms Reported: No symptoms reported      Genitourinary   Genitourinary Comment: Renal transplant list  Integumentary Integumentary Symptoms Reported: No symptoms reported    Musculoskeletal Musculoskelatal Symptoms Reviewed: No symptoms reported   Falls in the past year?: No Number of falls in past year: 1 or less Was there an injury with Fall?: No Fall Risk Category Calculator: 0 Patient Fall Risk Level: Low Fall Risk Fall risk Follow up: Falls evaluation completed  Psychosocial Psychosocial Symptoms Reported: No symptoms reported Behavioral Management Strategies: Medication therapy Major Change/Loss/Stressor/Fears (CP): Medical condition, self Quality of Family Relationships: supportive Do you feel physically threatened by others?: No      03/01/2024    1:23 PM  Depression screen PHQ 2/9  Decreased Interest 0  Down, Depressed, Hopeless 0  PHQ - 2 Score 0    Vitals:   03/01/24 1310  SpO2: 95%    Medications Reviewed Today     Reviewed by Devra Lands, RN (Registered Nurse) on 03/01/24 at 1315  Med List Status: <None>   Medication Order Taking? Sig Documenting Provider Last Dose Status Informant  acetaminophen  (TYLENOL ) 325 MG tablet [id number]  Take 650 mg by mouth every 6 (six) hours as needed (pain.). [provider]  Active Self, Spouse/Significant Other           Med Note CATHY OVAL VEAR [place] Dec 07, 2023  3:43 PM) prn  albuterol  (VENTOLIN  HFA) 108 (90 Base) MCG/ACT inhaler [id number] Yes Inhale 2 puffs into the lungs every 6 (six) hours as needed for wheezing or shortness of breath. Glendia Shad, MD  Active Spouse/Significant Other   atorvastatin  (LIPITOR) 40 MG tablet [id number] Yes Take 1 tablet (40 mg total) by mouth daily. Glendia Shad, MD  Active Spouse/Significant Other     Discontinued 03/01/24 1306 (Patient Discharge)   buPROPion  (WELLBUTRIN  XL) 150 MG 24 hr tablet [id number] Yes Take 1 tablet (150 mg total) by mouth daily. Glendia Shad, MD  Active Spouse/Significant Other     Discontinued 03/01/24 1306 (Patient Discharge)   calcitRIOL  (ROCALTROL ) 0.25 MCG capsule [id number] Yes Take 0.25 mcg by mouth daily. [provider]  Active Self, Spouse/Significant Other     Discontinued 03/01/24 1306 (Patient Discharge)   calcium  carbonate (OS-CAL - DOSED IN MG OF ELEMENTAL CALCIUM ) 1250 (500 Ca) MG tablet [id number] Yes Take 1 tablet (500 mg of elemental calcium  total) by mouth 3 (three) times daily with meals. Patel, Sona, MD  Active Self, Spouse/Significant Other     Discontinued 03/01/24 1306 (Patient Discharge)   carvedilol  (COREG ) 3.125 MG tablet [id number] Yes Take 1 tablet (3.125 mg total) by mouth 2 (two) times daily. Riddle, Suzann, NP  Active Spouse/Significant Other     Discontinued 03/01/24 1306 (Patient Discharge)   dextromethorphan -guaiFENesin  (MUCINEX  DM) 30-600 MG 12hr tablet [id number] Yes Take 1 tablet by mouth 2 (two) times daily as needed for up to 5 days for cough. Dorinda Drue DASEN, MD  Active      Discontinued 03/01/24 1306 (Patient Discharge)   FARXIGA  10 MG TABS tablet [id number] Yes Take 10 mg by mouth daily. [provider]  Active Self, Spouse/Significant Other  hydrALAZINE  (APRESOLINE ) 25 MG tablet [id number] Yes Take 1 tablet (25 mg total) by mouth 3 (three) times daily. Rolan Ezra RAMAN, MD  Active Self, Spouse/Significant Other     Discontinued 03/01/24 1306 (Patient Discharge)      Discontinued 03/01/24 1306 (Patient Discharge)      Discontinued 03/01/24 1306 (Patient Discharge)      Discontinued 03/01/24 1306 (Patient Discharge)   isosorbide  mononitrate (IMDUR ) 30 MG 24 hr  tablet [id number] Yes Take 1 tablet (30 mg total) by mouth daily. Rolan Ezra RAMAN, MD  Active Self, Spouse/Significant Other  levothyroxine  (SYNTHROID ) 75 MCG tablet [id number] Yes Take 1 tablet (75 mcg total) by mouth daily before breakfast. Glendia Shad, MD  Active Spouse/Significant Other     Discontinued 03/01/24 1306 (Patient Discharge)   loratadine  (CLARITIN ) 10 MG tablet [id number] Yes Take 10 mg by mouth daily. [provider]  Active Self, Spouse/Significant Other     Discontinued 03/01/24 1306 (Patient Discharge)      Discontinued 03/01/24 1306 (Patient Discharge)   omeprazole  (PRILOSEC) 20 MG capsule [id number] Yes Take 1 capsule (20 mg total) by mouth daily. Riddle, Suzann, NP  Active Self, Spouse/Significant Other     Discontinued 03/01/24 1306 (Patient Discharge)   predniSONE  (DELTASONE ) 10 MG tablet [id number] Yes Take 4 tablets (40 mg total) by mouth daily for 3 days, THEN 2 tablets (20 mg total) daily for 3 days, THEN 1 tablet (10 mg total) daily for 3 days. Dorinda Drue DASEN, MD  Active   venlafaxine  XR (EFFEXOR -XR) 150 MG 24 hr capsule [id number] Yes Take 1 capsule (150 mg total) by mouth daily. Glendia Shad, MD  Active Spouse/Significant Other     Discontinued 03/01/24 1306 (Patient Discharge)   warfarin (COUMADIN ) 2.5 MG tablet [id number] Yes  Take 2.5 mg by mouth daily. Patient takes 1 1/2 tablet on M,T,TH,F,Sat, Sun.  Takes 2 tablets on Wednesday. [provider]  Active Self, Spouse/Significant Other     Discontinued 03/01/24 1306 (Patient Discharge)             Recommendation:   PCP Follow-up Continue Current Plan of Care  Follow Up Plan:   Telephone follow-up in 1 month  Nestora Duos, MSN, RN [facility]  [facility], [facility] RN Care Manager Direct Dial: [phone] Fax: [phone]

## 2024-03-02 ENCOUNTER — Telehealth: Payer: Self-pay

## 2024-03-02 ENCOUNTER — Other Ambulatory Visit: Payer: Self-pay | Admitting: Acute Care

## 2024-03-02 DIAGNOSIS — Z87891 Personal history of nicotine dependence: Secondary | ICD-10-CM

## 2024-03-02 DIAGNOSIS — Z122 Encounter for screening for malignant neoplasm of respiratory organs: Secondary | ICD-10-CM

## 2024-03-02 NOTE — Telephone Encounter (Signed)
 Called and spoke with pt. Scheduled coumadin  clinic appt in [place] for next Wednesday, 03/10/24 at 10:45am (pt requested morning appt). Pt verbalized understanding.

## 2024-03-02 NOTE — Patient Instructions (Signed)
 Visit Information  Thank you for taking time to visit with me today. Please don't hesitate to contact me if I can be of assistance to you.   You are scheduled to follow up with Nestora Duos, RN case manager on 03/29/24 at 2 pm  Patient instructions: Notify provider for any increase in symptoms that are mild to moderate.  Call 911 for severe shortness of breath, chest pain, worsening symptoms Take medications as prescribed Keep follow up appointments with providers Stay hydrated  Patient verbalizes understanding of instructions and care plan provided today and agrees to view in MyChart. Active MyChart status and patient understanding of how to access instructions and care plan via MyChart confirmed with patient.     The patient has been provided with contact information for the care management team and has been advised to call with any health related questions or concerns.   Please call the care guide team at [phone] if you need to cancel or reschedule your appointment.   Please call the Suicide and Crisis Lifeline: 988 call 1-800-273-TALK (toll free, 24 hour hotline) if you are experiencing a Mental Health or Behavioral Health Crisis or need someone to talk to.  Arvin Seip RN, BSN, CCM [employer], Population Health Case Manager Phone: [phone]

## 2024-03-02 NOTE — Telephone Encounter (Signed)
-----   Message from Nurse Tinnie B sent at 03/02/2024 11:27 AM EDT ----- Regarding: INR post hospital Hey,  Pt has been discharged from the hospital. Please make sure INR checks are rescheduled.   Thanks, Tinnie

## 2024-03-02 NOTE — Transitions of Care (Post Inpatient/ED Visit) (Signed)
 03/02/2024  Name: Dawn Sherman MRN: [id number] DOB: 1955-11-10  Today's TOC FU Call Status: Today's TOC FU Call Status:: Successful TOC FU Call Completed TOC FU Call Complete Date: 03/02/24 Patient's Name and Date of Birth confirmed.  Transition Care Management Follow-up Telephone Call Date of Discharge: 02/29/24 Discharge Facility[facility]) Type of Discharge: Inpatient Admission Primary Inpatient Discharge Diagnosis:: Acute on chronic hypoxia respiratory failure in setting of COPD exacerbation How have you been since you were released from the hospital?: Better Any questions or concerns?: No  Items Reviewed: Did you receive and understand the discharge instructions provided?:  (discharge summary reviewed with patient.) Medications obtained,verified, and reconciled?: Yes (Medications Reviewed) Any new allergies since your discharge?: No Dietary orders reviewed?: Yes Type of Diet Ordered:: Cardiac Diet Do you have support at home?: Yes People in Home [RPT]: spouse Name of Support/Comfort Primary Source: Rutha Kipper  Medications Reviewed Today: Medications Reviewed Today     Reviewed by Jimel Myler E, RN (Registered Nurse) on 03/02/24 at 1338  Med List Status: <None>   Medication Order Taking? Sig Documenting Provider Last Dose Status Informant  acetaminophen  (TYLENOL ) 325 MG tablet [id number] Yes Take 650 mg by mouth every 6 (six) hours as needed (pain.). [provider]  Active Self, Spouse/Significant Other           Med Note CATHY, OVAL VEAR Repress Dec 07, 2023  3:43 PM) prn  albuterol  (VENTOLIN  HFA) 108 (90 Base) MCG/ACT inhaler [id number] Yes Inhale 2 puffs into the lungs every 6 (six) hours as needed for wheezing or shortness of breath. Glendia Shad, MD  Active Spouse/Significant Other  atorvastatin  (LIPITOR) 40 MG tablet [id number] Yes Take 1 tablet (40 mg total) by mouth daily. Glendia Shad, MD  Active Spouse/Significant  Other  buPROPion  (WELLBUTRIN  XL) 150 MG 24 hr tablet [id number] Yes Take 1 tablet (150 mg total) by mouth daily. Glendia Shad, MD  Active Spouse/Significant Other  calcitRIOL  (ROCALTROL ) 0.25 MCG capsule [id number] Yes Take 0.25 mcg by mouth daily. [provider]  Active Self, Spouse/Significant Other  calcium  carbonate (OS-CAL - DOSED IN MG OF ELEMENTAL CALCIUM ) 1250 (500 Ca) MG tablet [id number] Yes Take 1 tablet (500 mg of elemental calcium  total) by mouth 3 (three) times daily with meals. Patel, Sona, MD  Active Self, Spouse/Significant Other  carvedilol  (COREG ) 3.125 MG tablet [id number] Yes Take 1 tablet (3.125 mg total) by mouth 2 (two) times daily. Riddle, Suzann, NP  Active Spouse/Significant Other  dextromethorphan -guaiFENesin  (MUCINEX  DM) 30-600 MG 12hr tablet [id number] Yes Take 1 tablet by mouth 2 (two) times daily as needed for up to 5 days for cough. Dorinda Drue DASEN, MD  Active   FARXIGA  10 MG TABS tablet [id number] Yes Take 10 mg by mouth daily. [provider]  Active Self, Spouse/Significant Other  hydrALAZINE  (APRESOLINE ) 25 MG tablet [id number] Yes Take 1 tablet (25 mg total) by mouth 3 (three) times daily. Rolan Ezra RAMAN, MD  Active Self, Spouse/Significant Other  isosorbide  mononitrate (IMDUR ) 30 MG 24 hr tablet [id number] Yes Take 1 tablet (30 mg total) by mouth daily. Rolan Ezra RAMAN, MD  Active Self, Spouse/Significant Other  levothyroxine  (SYNTHROID ) 75 MCG tablet [id number] Yes Take 1 tablet (75 mcg total) by mouth daily before breakfast. Glendia Shad, MD  Active Spouse/Significant Other  loratadine  (CLARITIN ) 10 MG tablet [id number] Yes Take 10 mg by mouth daily. [provider]  Active Self, Spouse/Significant Other  omeprazole  (PRILOSEC) 20 MG capsule [id number] Yes  Take 1 capsule (20 mg total) by mouth daily. Riddle, Suzann, NP  Active Self, Spouse/Significant Other  predniSONE  (DELTASONE ) 10 MG tablet [id number] Yes Take 4 tablets (40 mg total) by  mouth daily for 3 days, THEN 2 tablets (20 mg total) daily for 3 days, THEN 1 tablet (10 mg total) daily for 3 days. Djan, Prince T, MD  Active   venlafaxine  XR (EFFEXOR -XR) 150 MG 24 hr capsule [id number] Yes Take 1 capsule (150 mg total) by mouth daily. Glendia Shad, MD  Active Spouse/Significant Other  warfarin (COUMADIN ) 2.5 MG tablet [id number] Yes Take 2.5 mg by mouth daily. Patient takes 1 1/2 tablet on M,T,TH,F,Sat, Sun.  Takes 2 tablets on Wednesday. [provider]  Active Self, Spouse/Significant Other            Home Care and Equipment/Supplies: Were Home Health Services Ordered?: No Any new equipment or medical supplies ordered?: Yes Name of Medical supply agency?: [employer] Were you able to get the equipment/medical supplies?: Yes (patient reports receiving oxygen tanks and concentrator) Do you have any questions related to the use of the equipment/supplies?: No  Functional Questionnaire: Do you need assistance with bathing/showering or dressing?: No Do you need assistance with meal preparation?: No Do you need assistance with eating?: No Do you have difficulty maintaining continence: No Do you need assistance with getting out of bed/getting out of a chair/moving?: No Do you have difficulty managing or taking your medications?: No  Follow up appointments reviewed: PCP Follow-up appointment confirmed?: No (patient states she will call and make follow up appointment with primary care provider.) MD Provider Line Number:[phone] Given: Yes Specialist Hospital Follow-up appointment confirmed?: Yes Date of Specialist follow-up appointment?: 03/19/24 Follow-Up Specialty Provider:: Dr.Dalton Rolan Do you need transportation to your follow-up appointment?: No Do you understand care options if your condition(s) worsen?: Yes-patient verbalized understanding  SDOH Interventions Today    Flowsheet Row Most Recent Value  SDOH Interventions   Food Insecurity  Interventions Intervention Not Indicated  Housing Interventions Intervention Not Indicated  Transportation Interventions Intervention Not Indicated  Utilities Interventions Intervention Not Indicated   Patient declined offer for 30 day TOC program due to having ongoing follow up with Nestora Duos, RN case manager.  Per chart review patient scheduled with Maridora on 03/29/24 at 2 pm.  Arvin Seip RN, BSN, CCM [place]  [facility], Population Health Case Manager Phone: [phone]

## 2024-03-09 ENCOUNTER — Encounter: Payer: Self-pay | Admitting: Nurse Practitioner

## 2024-03-09 ENCOUNTER — Ambulatory Visit: Admitting: Nurse Practitioner

## 2024-03-09 VITALS — BP 130/82 | HR 98 | Temp 98.0°F | Ht 65.0 in | Wt 109.0 lb

## 2024-03-09 DIAGNOSIS — J441 Chronic obstructive pulmonary disease with (acute) exacerbation: Secondary | ICD-10-CM | POA: Diagnosis not present

## 2024-03-09 NOTE — Progress Notes (Signed)
 Established Patient Office Visit  Subjective:  Patient ID: Dawn Sherman, female    DOB: 1956-06-30  Age: 68 y.o. MRN: [id number]  CC:  Chief Complaint  Patient presents with   Hospitalization Follow-up    HPI  Dawn Sherman presents for hospital follow up.  She was admitted to [facility] from  02/23/2024 to 03/01/2024 with chief complaint of shortness of breath.  She reported that the shortness of breath began a few days prior to her presentation to ED.  In the ED she required 6 L of oxygen to maintain oxygen saturation in the 90s.  The chest x-ray did not reveal any acute abnormalities.  She tested positive for rhinovirus  Since discharge, she has not experienced any shortness of breath or chest pain. She is currently using three liters of oxygen at night. O2 sat in office 95% without oxygen.  She  is able to perform daily activities such as laundry, cooking, and grocery shopping without experiencing shortness of breath. She lives in a single-story house, which does not require her to climb stairs. HPI   Past Medical History:  Diagnosis Date   B12 deficiency    Cardiomyopathy (HCC)    a. 09/2022 Echo: EF 35-40%; b. 09/2022 MV: apical defect w/ mild peri-infarct ischemia. EF 43%.   Chronic HFrEF (heart failure with reduced ejection fraction) (HCC)    a. 01/2020 Echo: EF 30-35%; b. 07/2020 Echo: EF 50-55%; c. 06/2022 Echo: EF 50-55%; d. 09/2022 Echo: EF 35-40%, globh HK, mod reduced RV fxn, sev BAE, mod MR.   CKD (chronic kidney disease), stage IV (HCC)    COPD (chronic obstructive pulmonary disease) (HCC)    Depression    Endometriosis    Hypertension    Mixed hyperlipidemia    Moderate mitral regurgitation    Normocytic anemia    PAF (paroxysmal atrial fibrillation) (HCC)    a. CHA2DS2VASc = 4-->eliquis  2.5 bid.   Paroxymsal Atrial flutter (HCC)    Tobacco abuse     Past Surgical History:  Procedure Laterality Date   ABDOMINAL HYSTERECTOMY     ATRIAL FIBRILLATION ABLATION N/A  08/04/2023   Procedure: ATRIAL FIBRILLATION ABLATION;  Surgeon: Cindie Ole DASEN, MD;  Location: [facility] INVASIVE CV LAB;  Service: Cardiovascular;  Laterality: N/A;   BREAST BIOPSY Right 2015   neg-  FIBROADENOMA   CENTRAL LINE INSERTION  11/15/2022   Procedure: CENTRAL LINE INSERTION;  Surgeon: Mady Bruckner, MD;  Location: [facility] INVASIVE CV LAB;  Service: Cardiovascular;;   COLONOSCOPY WITH PROPOFOL  N/A 11/24/2022   Procedure: COLONOSCOPY WITH PROPOFOL ;  Surgeon: Maryruth Ole DASEN, MD;  Location: [facility] ENDOSCOPY;  Service: Endoscopy;  Laterality: N/A;   COLONOSCOPY WITH PROPOFOL  N/A 11/23/2022   Procedure: COLONOSCOPY WITH PROPOFOL ;  Surgeon: Maryruth Ole DASEN, MD;  Location: [facility] ENDOSCOPY;  Service: Endoscopy;  Laterality: N/A;   ESOPHAGOGASTRODUODENOSCOPY (EGD) WITH PROPOFOL  N/A 11/23/2022   Procedure: ESOPHAGOGASTRODUODENOSCOPY (EGD) WITH PROPOFOL ;  Surgeon: Maryruth Ole DASEN, MD;  Location: [facility] ENDOSCOPY;  Service: Endoscopy;  Laterality: N/A;   RIGHT HEART CATH N/A 11/15/2022   Procedure: RIGHT HEART CATH;  Surgeon: Mady Bruckner, MD;  Location: [facility] INVASIVE CV LAB;  Service: Cardiovascular;  Laterality: N/A;   RIGHT/LEFT HEART CATH AND CORONARY ANGIOGRAPHY N/A 11/25/2022   Procedure: RIGHT/LEFT HEART CATH AND CORONARY ANGIOGRAPHY;  Surgeon: Darron Deatrice LABOR, MD;  Location: [facility] INVASIVE CV LAB;  Service: Cardiovascular;  Laterality: N/A;   TAH/RSO  1999   secondary to bleeding and endometriosis (Dr Trula)  TRANSESOPHAGEAL ECHOCARDIOGRAM (CATH LAB) N/A 08/04/2023   Procedure: TRANSESOPHAGEAL ECHOCARDIOGRAM;  Surgeon: Cindie Ole DASEN, MD;  Location: [facility] INVASIVE CV LAB;  Service: Cardiovascular;  Laterality: N/A;    Family History  Problem Relation Age of Onset   Hypercholesterolemia Mother    Rheum arthritis Father    Rheum arthritis Daughter    Fibromyalgia Daughter    Breast cancer Neg Hx    Colon cancer Neg Hx     Social History   Socioeconomic History   Marital  status: Married    Spouse name: Not on file   Number of children: Not on file   Years of education: Not on file   Highest education level: 12th grade  Occupational History   Not on file  Tobacco Use   Smoking status: Former    Current packs/day: 0.00    Average packs/day: 0.5 packs/day for 49.0 years (24.5 ttl pk-yrs)    Types: Cigarettes    Start date: 24    Quit date: 2024    Years since quitting: 1.5   Smokeless tobacco: Never   Tobacco comments:    Patient quit smoking recently \\Quit  6 months ago  Vaping Use   Vaping status: Never Used  Substance and Sexual Activity   Alcohol use: Not Currently    Alcohol/week: 0.0 standard drinks of alcohol    Comment: occasional   Drug use: No   Sexual activity: Not Currently  Other Topics Concern   Not on file  Social History Narrative   Lives in [place] with her husband.  She is retired from [employer]	[place].  She does not routinely exercise.   Social Drivers of Corporate investment banker Strain: Low Risk  (12/15/2023)   Overall Financial Resource Strain (CARDIA)    Difficulty of Paying Living Expenses: Not hard at all  Food Insecurity: No Food Insecurity (03/02/2024)   Hunger Vital Sign    Worried About Running Out of Food in the Last Year: Never true    Ran Out of Food in the Last Year: Never true  Transportation Needs: No Transportation Needs (03/02/2024)   PRAPARE - Administrator, Civil Service (Medical): No    Lack of Transportation (Non-Medical): No  Physical Activity: Inactive (12/15/2023)   Exercise Vital Sign    Days of Exercise per Week: 0 days    Minutes of Exercise per Session: 0 min  Stress: No Stress Concern Present (12/15/2023)   [employer] of Occupational Health - Occupational Stress Questionnaire    Feeling of Stress : Only a little  Social Connections: Socially Integrated (02/24/2024)   Social Connection and Isolation Panel    Frequency of Communication with Friends and Family: Three  times a week    Frequency of Social Gatherings with Friends and Family: Three times a week    Attends Religious Services: 1 to 4 times per year    Active Member of Clubs or Organizations: Yes    Attends Banker Meetings: 1 to 4 times per year    Marital Status: Married  Catering manager Violence: Not At Risk (03/02/2024)   Humiliation, Afraid, Rape, and Kick questionnaire    Fear of Current or Ex-Partner: No    Emotionally Abused: No    Physically Abused: No    Sexually Abused: No     Outpatient Medications Prior to Visit  Medication Sig Dispense Refill   acetaminophen  (TYLENOL ) 325 MG tablet Take 650 mg by mouth every 6 (six) hours  as needed (pain.).     albuterol  (VENTOLIN  HFA) 108 (90 Base) MCG/ACT inhaler Inhale 2 puffs into the lungs every 6 (six) hours as needed for wheezing or shortness of breath. 18 g 0   atorvastatin  (LIPITOR) 40 MG tablet Take 1 tablet (40 mg total) by mouth daily. 90 tablet 0   buPROPion  (WELLBUTRIN  XL) 150 MG 24 hr tablet Take 1 tablet (150 mg total) by mouth daily. 90 tablet 1   calcitRIOL  (ROCALTROL ) 0.25 MCG capsule Take 0.25 mcg by mouth daily.     calcium  carbonate (OS-CAL - DOSED IN MG OF ELEMENTAL CALCIUM ) 1250 (500 Ca) MG tablet Take 1 tablet (500 mg of elemental calcium  total) by mouth 3 (three) times daily with meals. 60 tablet 1   carvedilol  (COREG ) 3.125 MG tablet Take 1 tablet (3.125 mg total) by mouth 2 (two) times daily. 180 tablet 3   hydrALAZINE  (APRESOLINE ) 25 MG tablet Take 1 tablet (25 mg total) by mouth 3 (three) times daily. 90 tablet 5   isosorbide  mononitrate (IMDUR ) 30 MG 24 hr tablet Take 1 tablet (30 mg total) by mouth daily. 90 tablet 3   levothyroxine  (SYNTHROID ) 75 MCG tablet Take 1 tablet (75 mcg total) by mouth daily before breakfast. 30 tablet 1   loratadine  (CLARITIN ) 10 MG tablet Take 10 mg by mouth daily.     omeprazole  (PRILOSEC) 20 MG capsule Take 1 capsule (20 mg total) by mouth daily.     predniSONE   (DELTASONE ) 10 MG tablet Take 4 tablets (40 mg total) by mouth daily for 3 days, THEN 2 tablets (20 mg total) daily for 3 days, THEN 1 tablet (10 mg total) daily for 3 days. 21 tablet 0   venlafaxine  XR (EFFEXOR -XR) 150 MG 24 hr capsule Take 1 capsule (150 mg total) by mouth daily. 90 capsule 1   warfarin (COUMADIN ) 2.5 MG tablet Take 2.5 mg by mouth daily. Patient takes 1 1/2 tablet on M,T,TH,F,Sat, Sun.  Takes 2 tablets on Wednesday.     FARXIGA  10 MG TABS tablet Take 10 mg by mouth daily.     No facility-administered medications prior to visit.    Allergies  Allergen Reactions   Sulfate Rash   Codeine Sulfate Nausea Only   Benicar [Olmesartan]     Talked with patient February 10, 2020, intolerance is unclear, tried several medications around that time and one of them gave her a rash but she is not clear which 1.   Amoxicillin Rash   Clindamycin/Lincomycin Rash   Entresto  [Sacubitril -Valsartan ] Other (See Comments)    hyperkalemia   Morphine And Codeine Rash   Penicillins Rash    ROS Review of Systems Negative unless indicated in HPI.    Objective:    Physical Exam Constitutional:      Appearance: Normal appearance.  HENT:     Mouth/Throat:     Mouth: Mucous membranes are moist.  Eyes:     Conjunctiva/sclera: Conjunctivae normal.     Pupils: Pupils are equal, round, and reactive to light.  Cardiovascular:     Rate and Rhythm: Normal rate and regular rhythm.     Pulses: Normal pulses.     Heart sounds: Normal heart sounds.  Pulmonary:     Effort: Pulmonary effort is normal.     Breath sounds: Normal breath sounds.  Abdominal:     General: Bowel sounds are normal.     Palpations: Abdomen is soft.  Musculoskeletal:     Cervical back: Normal range of motion. No  tenderness.     Right lower leg: No edema.     Left lower leg: No edema.  Skin:    General: Skin is warm.     Findings: No bruising.  Neurological:     General: No focal deficit present.     Mental Status:  She is alert and oriented to person, place, and time. Mental status is at baseline.  Psychiatric:        Mood and Affect: Mood normal.        Behavior: Behavior normal.        Thought Content: Thought content normal.     BP 130/82   Pulse 98   Temp 98 F (36.7 C)   Ht 5' 5 (1.651 m)   Wt 109 lb (49.4 kg)   SpO2 95%   BMI 18.14 kg/m  Wt Readings from Last 3 Encounters:  03/24/24 116 lb 12.8 oz (53 kg)  03/19/24 116 lb 3.2 oz (52.7 kg)  03/10/24 108 lb (49 kg)     Health Maintenance  Topic Date Due   COVID-19 Vaccine (3 - Pfizer risk series) 03/24/2024 (Originally 05/07/2020)   Pneumococcal Vaccine: 50+ Years (2 of 2 - PCV) 10/21/2024 (Originally 02/12/2021)   INFLUENZA VACCINE  03/26/2024   Medicare Annual Wellness (AWV)  12/15/2024   Lung Cancer Screening  02/11/2025   MAMMOGRAM  06/24/2025   Colonoscopy  11/23/2032   DEXA SCAN  Completed   Hepatitis C Screening  Completed   Zoster Vaccines- Shingrix  Completed   Hepatitis B Vaccines  Aged Out   HPV VACCINES  Aged Out   Meningococcal B Vaccine  Aged Out   DTaP/Tdap/Td  Discontinued    There are no preventive care reminders to display for this patient.  Lab Results  Component Value Date   TSH 1.38 02/19/2024   Lab Results  Component Value Date   WBC 12.3 (H) 03/10/2024   HGB 13.7 03/10/2024   HCT 42.5 03/10/2024   MCV 94.7 03/10/2024   PLT 196 03/10/2024   Lab Results  Component Value Date   NA 136 03/10/2024   K 4.6 03/10/2024   CO2 24 03/10/2024   GLUCOSE 115 (H) 03/10/2024   BUN 74 (H) 03/10/2024   CREATININE 3.21 (H) 03/10/2024   BILITOT 0.8 02/23/2024   ALKPHOS 84 02/23/2024   AST 16 02/23/2024   ALT 13 02/23/2024   PROT 6.4 (L) 02/23/2024   ALBUMIN  3.6 02/23/2024   CALCIUM  8.6 (L) 03/10/2024   ANIONGAP 11 03/10/2024   EGFR 15 (L) 07/22/2023   GFR 13.74 (LL) 03/09/2024   Lab Results  Component Value Date   CHOL 141 02/19/2024   Lab Results  Component Value Date   HDL 53.00 02/19/2024    Lab Results  Component Value Date   LDLCALC 66 02/19/2024   Lab Results  Component Value Date   TRIG 111.0 02/19/2024   Lab Results  Component Value Date   CHOLHDL 3 02/19/2024   Lab Results  Component Value Date   HGBA1C 4.8 01/02/2023      Assessment & Plan:  COPD exacerbation (HCC) Assessment & Plan: Recent exacerbation likely viral, hospitalized, discharged on oxygen. Saturation improved to 95%. No dyspnea or chest pain. Daily activities without distress. Pulmonology follow-up needed. - Schedule pulmonology follow-up. - Labs ordered as outlined. - Continue albuterol  inhaler as needed.  Orders: -     Basic metabolic panel with GFR -     CBC with Differential/Platelet    Follow-up:  No follow-ups on file.   Jarrett Chicoine, NP

## 2024-03-10 ENCOUNTER — Ambulatory Visit: Attending: Cardiovascular Disease

## 2024-03-10 ENCOUNTER — Emergency Department
Admission: EM | Admit: 2024-03-10 | Discharge: 2024-03-10 | Discharge status: Against Medical Advice | Attending: Emergency Medicine | Admitting: Emergency Medicine

## 2024-03-10 ENCOUNTER — Other Ambulatory Visit: Payer: Self-pay

## 2024-03-10 ENCOUNTER — Encounter: Payer: Self-pay | Admitting: Emergency Medicine

## 2024-03-10 ENCOUNTER — Telehealth: Payer: Self-pay | Admitting: *Deleted

## 2024-03-10 DIAGNOSIS — I480 Paroxysmal atrial fibrillation: Secondary | ICD-10-CM | POA: Insufficient documentation

## 2024-03-10 DIAGNOSIS — Z7901 Long term (current) use of anticoagulants: Secondary | ICD-10-CM | POA: Diagnosis present

## 2024-03-10 DIAGNOSIS — Z5321 Procedure and treatment not carried out due to patient leaving prior to being seen by health care provider: Secondary | ICD-10-CM | POA: Insufficient documentation

## 2024-03-10 DIAGNOSIS — R799 Abnormal finding of blood chemistry, unspecified: Secondary | ICD-10-CM | POA: Insufficient documentation

## 2024-03-10 LAB — BASIC METABOLIC PANEL WITH GFR
Anion gap: 11 (ref 5–15)
BUN: 70 mg/dL — ABNORMAL HIGH (ref 6–23)
BUN: 74 mg/dL — ABNORMAL HIGH (ref 8–23)
CO2: 28 meq/L (ref 19–32)
CO2: 24 mmol/L (ref 22–32)
Calcium: 9.1 mg/dL (ref 8.4–10.5)
Calcium: 8.6 mg/dL — ABNORMAL LOW (ref 8.9–10.3)
Chloride: 99 meq/L (ref 96–112)
Chloride: 101 mmol/L (ref 98–111)
Creatinine, Ser: 3.31 mg/dL — ABNORMAL HIGH (ref 0.40–1.20)
Creatinine, Ser: 3.21 mg/dL — ABNORMAL HIGH (ref 0.44–1.00)
GFR, Estimated: 15 mL/min — ABNORMAL LOW (ref >60)
GFR: 13.74 mL/min — CL (ref >60.00)
Glucose, Bld: 115 mg/dL — ABNORMAL HIGH (ref 70–99)
Glucose, Bld: 115 mg/dL — ABNORMAL HIGH (ref 70–99)
Potassium: 5.3 meq/L — ABNORMAL HIGH (ref 3.5–5.1)
Potassium: 4.6 mmol/L (ref 3.5–5.1)
Sodium: 136 meq/L (ref 135–145)
Sodium: 136 mmol/L (ref 135–145)

## 2024-03-10 LAB — CBC WITH DIFFERENTIAL/PLATELET
Abs Immature Granulocytes: 0.07 K/uL (ref 0.00–0.07)
Basophils Absolute: 0 K/uL (ref 0.0–0.1)
Basophils Absolute: 0 K/uL (ref 0.0–0.1)
Basophils Relative: 0.3 % (ref 0.0–3.0)
Basophils Relative: 0 %
Eosinophils Absolute: 0 K/uL (ref 0.0–0.7)
Eosinophils Absolute: 0.1 K/uL (ref 0.0–0.5)
Eosinophils Relative: 0.1 % (ref 0.0–5.0)
Eosinophils Relative: 1 %
HCT: 45.5 % (ref 36.0–46.0)
HCT: 42.5 % (ref 36.0–46.0)
Hemoglobin: 14.6 g/dL (ref 12.0–15.0)
Hemoglobin: 13.7 g/dL (ref 12.0–15.0)
Immature Granulocytes: 1 %
Lymphocytes Relative: 5.2 % — ABNORMAL LOW (ref 12.0–46.0)
Lymphocytes Relative: 13 %
Lymphs Abs: 0.7 K/uL (ref 0.7–4.0)
Lymphs Abs: 1.6 K/uL (ref 0.7–4.0)
MCH: 30.5 pg (ref 26.0–34.0)
MCHC: 32.2 g/dL (ref 30.0–36.0)
MCHC: 32.2 g/dL (ref 30.0–36.0)
MCV: 93.5 fl (ref 78.0–100.0)
MCV: 94.7 fL (ref 80.0–100.0)
Monocytes Absolute: 0.5 K/uL (ref 0.1–1.0)
Monocytes Absolute: 1.1 K/uL — ABNORMAL HIGH (ref 0.1–1.0)
Monocytes Relative: 4.1 % (ref 3.0–12.0)
Monocytes Relative: 9 %
Neutro Abs: 11.8 K/uL — ABNORMAL HIGH (ref 1.4–7.7)
Neutro Abs: 9.5 K/uL — ABNORMAL HIGH (ref 1.7–7.7)
Neutrophils Relative %: 90.3 % — ABNORMAL HIGH (ref 43.0–77.0)
Neutrophils Relative %: 76 %
Platelets: 268 K/uL (ref 150.0–400.0)
Platelets: 196 K/uL (ref 150–400)
RBC: 4.87 Mil/uL (ref 3.87–5.11)
RBC: 4.49 MIL/uL (ref 3.87–5.11)
RDW: 13.7 % (ref 11.5–15.5)
RDW: 13.2 % (ref 11.5–15.5)
WBC: 13 K/uL — ABNORMAL HIGH (ref 4.0–10.5)
WBC: 12.3 K/uL — ABNORMAL HIGH (ref 4.0–10.5)
nRBC: 0 % (ref 0.0–0.2)

## 2024-03-10 LAB — POCT INR: INR: 2.8 (ref 2.0–3.0)

## 2024-03-10 NOTE — ED Triage Notes (Signed)
 Patient to ED via POV for abnormal lab. GFR 13.74. Denies any complaints. Dc'd last Monday from hospital for rhinovirus.

## 2024-03-10 NOTE — Telephone Encounter (Signed)
 Spoke to patient & notified her of recommendations. Pt states that her husband would not be back to [place] until around 8pm. But She received a call from Nephrology today & was scheduled an appt for Monday. I asked her to reach out to them in the morning first thing because I am not sure they have seen her lab results. I also forwarded a copy to Dr Dennise office at [facility].   *Routing to clinical pool to hold this message to check on patient on Thursday

## 2024-03-10 NOTE — Telephone Encounter (Signed)
.  CRITICAL VALUE STICKER  CRITICAL VALUE:  GFR-13.74  RECEIVER (on-site recipient of call): Clint Rockford, CMA  DATE & TIME NOTIFIED: 03/10/24 @ 11:06 am  MESSENGER (representative from lab): Saa  MD NOTIFIED: Dr Marylynn in absence of Chelsea Aurora, NP  TIME OF NOTIFICATION: 11:08am  RESPONSE:

## 2024-03-10 NOTE — Patient Instructions (Signed)
 Continue 1.5 tablets daily, except 2 tablets every Wednesday.  INR in 2 weeks.  [phone] Watchman 8/28  weekly INRs

## 2024-03-11 NOTE — Telephone Encounter (Signed)
 Left detailed information for Dr Jurline nurse on her secure voicemail.

## 2024-03-11 NOTE — Telephone Encounter (Signed)
 FYI for Doc of Day:  Patient says that she went to the ED and sat for 4 hours then left. They did repeat a CBC and BMP. Potassium was wnl at ED. She is scheduled to see her nephrologist on Monday. She says she is feeling ok. No acute symptoms.

## 2024-03-12 NOTE — Telephone Encounter (Signed)
 Called patient. Confirmed she is doing ok. She says that Dr Dennise plans to repeat her labs on Monday. She did agree to go to ED if any acute symptoms over the weekend.

## 2024-03-12 NOTE — Telephone Encounter (Signed)
 Noted! Thank you

## 2024-03-15 ENCOUNTER — Other Ambulatory Visit: Payer: Self-pay

## 2024-03-19 ENCOUNTER — Encounter: Payer: Self-pay | Admitting: Cardiology

## 2024-03-19 ENCOUNTER — Ambulatory Visit: Attending: Cardiology | Admitting: Cardiology

## 2024-03-19 VITALS — BP 144/58 | HR 92 | Wt 116.2 lb

## 2024-03-19 DIAGNOSIS — N184 Chronic kidney disease, stage 4 (severe): Secondary | ICD-10-CM | POA: Diagnosis not present

## 2024-03-19 DIAGNOSIS — J449 Chronic obstructive pulmonary disease, unspecified: Secondary | ICD-10-CM | POA: Diagnosis not present

## 2024-03-19 DIAGNOSIS — E785 Hyperlipidemia, unspecified: Secondary | ICD-10-CM | POA: Diagnosis not present

## 2024-03-19 DIAGNOSIS — Z79899 Other long term (current) drug therapy: Secondary | ICD-10-CM | POA: Insufficient documentation

## 2024-03-19 DIAGNOSIS — I130 Hypertensive heart and chronic kidney disease with heart failure and stage 1 through stage 4 chronic kidney disease, or unspecified chronic kidney disease: Secondary | ICD-10-CM | POA: Diagnosis not present

## 2024-03-19 DIAGNOSIS — Z87891 Personal history of nicotine dependence: Secondary | ICD-10-CM | POA: Insufficient documentation

## 2024-03-19 DIAGNOSIS — I4892 Unspecified atrial flutter: Secondary | ICD-10-CM | POA: Diagnosis not present

## 2024-03-19 DIAGNOSIS — I081 Rheumatic disorders of both mitral and tricuspid valves: Secondary | ICD-10-CM | POA: Diagnosis not present

## 2024-03-19 DIAGNOSIS — I480 Paroxysmal atrial fibrillation: Secondary | ICD-10-CM | POA: Diagnosis not present

## 2024-03-19 DIAGNOSIS — I5022 Chronic systolic (congestive) heart failure: Secondary | ICD-10-CM | POA: Insufficient documentation

## 2024-03-19 DIAGNOSIS — I428 Other cardiomyopathies: Secondary | ICD-10-CM | POA: Insufficient documentation

## 2024-03-19 DIAGNOSIS — Z7901 Long term (current) use of anticoagulants: Secondary | ICD-10-CM | POA: Insufficient documentation

## 2024-03-19 NOTE — Patient Instructions (Signed)
 Medication Changes:  No medication changes today!   Thank you for coming in today  If you are having labs drawn today, any labs that are abnormal the clinic will call you. No news is good news.   Follow-Up in: Please follow up with the [facility] in 6 months with Dr. Rolan. We do not currently have that schedule. Please give us  a call in December in order to schedule your appointment for January 2026.   Thank you for choosing [facility].    At the [facility], you and your health needs are our priority. We have a designated team specialized in the treatment of Heart Failure. This Care Team includes your primary Heart Failure Specialized Cardiologist (physician), Advanced Practice Providers (APPs- Physician Assistants and Nurse Practitioners), and Pharmacist who all work together to provide you with the care you need, when you need it.   You may see any of the following providers on your designated Care Team at your next follow up:  Dr. Toribio Fuel Dr. Ezra Rolan Dr. Ria Commander Dr. Morene Brownie Ellouise Class, FNP Jaun Bash, RPH-CPP  Please be sure to bring in all your medications bottles to every appointment.   Need to Contact Us :  If you have any questions or concerns before your next appointment please send us  a message through [facility] or call our office at [phone].    TO LEAVE A MESSAGE FOR THE NURSE SELECT OPTION 2, PLEASE LEAVE A MESSAGE INCLUDING: YOUR NAME DATE OF BIRTH CALL BACK NUMBER REASON FOR CALL**this is important as we prioritize the call backs  YOU WILL RECEIVE A CALL BACK THE SAME DAY AS LONG AS YOU CALL BEFORE 4:00 PM

## 2024-03-21 NOTE — Progress Notes (Signed)
 PCP: Glendia Shad, MD HF Cardiology: Dr. Cherrie  Chief complaint: Atrial fibrillation  Dawn Sherman is a 68 y.o. female with a history of COPD, prior tobacco abuse, paroxysmal atrial fibrillation and flutter, mitral regurgitation, hypertension, hyperlipidemia, stage IV chronic kidney disease, and systolic HF.     HF diagnosed in 6/21. Echo EF 30-35% in setting of new-onset AF.  Repeat echo in December 2021 showed improvement in EF to 50-55% with grade 1 diastolic dysfunction.  In April 2023, in the context of GI illness and weakness, she suffered a fall was found to have a subarachnoid hemorrhage.  Oral anticoagulation was initially held but then subsequently resumed once cleared by neurosurgery.     She required admissions in July 2023 and again in November 2023 in the setting of respiratory failure and volume overload.  Echo November 2023 showed an EF of 50 to 55%.  Unfortunately, she was readmitted in January 2024 with heart failure and pneumonia.  Repeat echo 09/28/22, which was performed during an episode of atrial flutter, showed an EF of 35 to 40%.  Following diuresis, she was discharged home.  Stress test performed in the outpatient setting was intermediate in the setting of an EF of 43% with apical infarct and mild peri-infarct ischemia.  No cath at that time due to CKD 4.    Admitted 11/14/22 with progressive weight gain and SOB. Labs showed acute on chronic normocytic anemia with an H&H of 7.1 and 23.9.  BNP was elevated at 3619.9.  Creatinine stable at 2.76 however, potassium was elevated at 5.5. CXR with vascular congestion and small bilateral pleural effusions.  ECG with NSR. Hstrop 9 and 10.  Echo 11/14/22 with EF 25-30% with global Hk, RV moderately down. Severe MR, mod-severe TR.  LHC/RHC showed normal coronaries, normal filling pressures, and preserved CO.   Cardiac MRI in 8/24 with LV EF 47%, no myocardial delayed enhancement, normal RV size, RV EF 59%, mild MR (improved).    She  had atrial fibrillation ablation in 12/24.  TEE at that time showed EF 50-55%, normal RV, mild MR.   She was admitted in 4/25 with diarrhea/nausea/vomiting.  She was noted to have AKI and 2:1 heart block.  Amiodarone  stopped.  Echo in 4/25 showed EF 55-60%, normal RV, mild MR.   Patient returns today for followup of CHF. She is in NSR today.  BP elevated but generally SBP runs about 130 at home. No smoking.  Had COPD exacerbation earlier in the month but breathing now back to normal.  No dyspnea walking on flat ground.  No chest pain.  No palpitations. Weight down 4 lbs.   Labs (4/24): TSH 165, K 5.2, creatinine 2.87 Labs (6/24): K 4.2, creatinine 2.44, BNP 1023, myeloma panel negative, hgb 12.7 Labs (7/24): TSH 13 (coming down) Labs (11/24): K 5.1, creatinine 2.83, hgb 14.4 Labs (12/24): creatinine 2.7 Labs (7/25): K 5.2, creatinine 3.14  ECG (personally reviewed): NSR, 1st degree AVB, lateral TWIs  PMH: 1. Atrial fibrillation: Paroxysmal - Ablation 12/24 2. COPD: Prior smoker.  3. HTN 4. Hyperlipidemia 5. CKD stage 4 6. Subarachnoid hemorrhage: 4/23, traumatic after fall.  7. Chronic systolic CHF: Nonischemic cardiomyopathy.  - Echo (6/21): EF 30-35% in setting of AF/RVR. - Echo (12/21): EF 50-55% - Echo (11/23): EF 50-55%, normal RV, mild AS - Echo (3/24): EF 25-30%, moderate RV dysfunction, severe MR, moderate-severe TR.  - LHC/RHC (4/24): Normal coronaries; mean RA 6, PA 50/17 mean 30, mean PCWP 16, CI 3.7.  -  Cardiac MRI (8/24): LV EF 47%, no myocardial delayed enhancement, normal RV size, RV EF 59%, mild MR (improved).  - TEE (12/24): EF 50-55%, normal RV, mild MR - Echo (4/25): EF 55-60%, normal RV, mild MR.  8. Anemia: Negative endoscopies 9. Hypothyroidism 10. Episode of 2:1 heart block in setting of GI illness and AKI in 4/25.   Social History   Socioeconomic History   Marital status: Married    Spouse name: Not on file   Number of children: Not on file   Years of  education: Not on file   Highest education level: 12th grade  Occupational History   Not on file  Tobacco Use   Smoking status: Former    Current packs/day: 0.00    Average packs/day: 0.5 packs/day for 49.0 years (24.5 ttl pk-yrs)    Types: Cigarettes    Start date: 61    Quit date: 2024    Years since quitting: 1.5   Smokeless tobacco: Never   Tobacco comments:    Patient quit smoking recently \\Quit  6 months ago  Vaping Use   Vaping status: Never Used  Substance and Sexual Activity   Alcohol use: Not Currently    Alcohol/week: 0.0 standard drinks of alcohol    Comment: occasional   Drug use: No   Sexual activity: Not Currently  Other Topics Concern   Not on file  Social History Narrative   Lives in [place] with her husband.  She is retired from [employer]	[place].  She does not routinely exercise.   Social Drivers of Corporate investment banker Strain: Low Risk  (12/15/2023)   Overall Financial Resource Strain (CARDIA)    Difficulty of Paying Living Expenses: Not hard at all  Food Insecurity: No Food Insecurity (03/02/2024)   Hunger Vital Sign    Worried About Running Out of Food in the Last Year: Never true    Ran Out of Food in the Last Year: Never true  Transportation Needs: No Transportation Needs (03/02/2024)   PRAPARE - Administrator, Civil Service (Medical): No    Lack of Transportation (Non-Medical): No  Physical Activity: Inactive (12/15/2023)   Exercise Vital Sign    Days of Exercise per Week: 0 days    Minutes of Exercise per Session: 0 min  Stress: No Stress Concern Present (12/15/2023)   [employer] of Occupational Health - Occupational Stress Questionnaire    Feeling of Stress : Only a little  Social Connections: Socially Integrated (02/24/2024)   Social Connection and Isolation Panel    Frequency of Communication with Friends and Family: Three times a week    Frequency of Social Gatherings with Friends and Family: Three times a week     Attends Religious Services: 1 to 4 times per year    Active Member of Clubs or Organizations: Yes    Attends Banker Meetings: 1 to 4 times per year    Marital Status: Married  Catering manager Violence: Not At Risk (03/02/2024)   Humiliation, Afraid, Rape, and Kick questionnaire    Fear of Current or Ex-Partner: No    Emotionally Abused: No    Physically Abused: No    Sexually Abused: No   Family History  Problem Relation Age of Onset   Hypercholesterolemia Mother    Rheum arthritis Father    Rheum arthritis Daughter    Fibromyalgia Daughter    Breast cancer Neg Hx    Colon cancer Neg Hx  ROS: All systems reviewed and negative except as per HPI.   Current Outpatient Medications  Medication Sig Dispense Refill   acetaminophen  (TYLENOL ) 325 MG tablet Take 650 mg by mouth every 6 (six) hours as needed (pain.).     albuterol  (VENTOLIN  HFA) 108 (90 Base) MCG/ACT inhaler Inhale 2 puffs into the lungs every 6 (six) hours as needed for wheezing or shortness of breath. 18 g 0   atorvastatin  (LIPITOR) 40 MG tablet Take 1 tablet (40 mg total) by mouth daily. 90 tablet 0   buPROPion  (WELLBUTRIN  XL) 150 MG 24 hr tablet Take 1 tablet (150 mg total) by mouth daily. 90 tablet 1   calcitRIOL  (ROCALTROL ) 0.25 MCG capsule Take 0.25 mcg by mouth daily.     calcium  carbonate (OS-CAL - DOSED IN MG OF ELEMENTAL CALCIUM ) 1250 (500 Ca) MG tablet Take 1 tablet (500 mg of elemental calcium  total) by mouth 3 (three) times daily with meals. 60 tablet 1   carvedilol  (COREG ) 3.125 MG tablet Take 1 tablet (3.125 mg total) by mouth 2 (two) times daily. 180 tablet 3   FARXIGA  10 MG TABS tablet Take 10 mg by mouth daily.     hydrALAZINE  (APRESOLINE ) 25 MG tablet Take 1 tablet (25 mg total) by mouth 3 (three) times daily. 90 tablet 5   isosorbide  mononitrate (IMDUR ) 30 MG 24 hr tablet Take 1 tablet (30 mg total) by mouth daily. 90 tablet 3   levothyroxine  (SYNTHROID ) 75 MCG tablet Take 1 tablet (75  mcg total) by mouth daily before breakfast. 30 tablet 1   loratadine  (CLARITIN ) 10 MG tablet Take 10 mg by mouth daily.     omeprazole  (PRILOSEC) 20 MG capsule Take 1 capsule (20 mg total) by mouth daily.     venlafaxine  XR (EFFEXOR -XR) 150 MG 24 hr capsule Take 1 capsule (150 mg total) by mouth daily. 90 capsule 1   warfarin (COUMADIN ) 2.5 MG tablet Take 2.5 mg by mouth daily. Patient takes 1 1/2 tablet on M,T,TH,F,Sat, Sun.  Takes 2 tablets on Wednesday.     No current facility-administered medications for this visit.   BP (!) 144/58   Pulse 92   Wt 116 lb 3.2 oz (52.7 kg)   SpO2 97%   BMI 19.34 kg/m  General: NAD Neck: No JVD, no thyromegaly or thyroid  nodule.  Lungs: Clear to auscultation bilaterally with normal respiratory effort. CV: Nondisplaced PMI.  Heart regular S1/S2, no S3/S4, no murmur.  No peripheral edema.  No carotid bruit.  Normal pedal pulses.  Abdomen: Soft, nontender, no hepatosplenomegaly, no distention.  Skin: Intact without lesions or rashes.  Neurologic: Alert and oriented x 3.  Psych: Normal affect. Extremities: No clubbing or cyanosis.  HEENT: Normal.   Assessment/Plan: 1. Chronic systolic CHF: Nonischemic cardiomyopathy.  EF has fluctuated with atrial fibrillation in the past, but echo in 3/24 showed EF 25-30%, moderate RV dysfunction, severe MR, moderate-severe TR in the setting of NSR.  Cath in 4/24 showed no significant coronary disease.  Cardiac MRI in 8/24 showed improvement; LV EF 47%, no myocardial delayed enhancement, normal RV size, RV EF 59%, mild MR. TEE in 12/24 showed EF 50-55% with normal RV.  Echo 4/25 with EF up to 55-60%. Cause of cardiomyopathy is uncertain, may be tachycardia-mediated cardiomyopathy as EF has now completely returned to normal in NSR. No h/o ETOH/drugs, no strong family history of cardiomyopathy.  NYHA class I-II (dyspnea seems more related to COPD), not volume overloaded on exam.  - She is off  torsemide  and does not appear to  need.   - Continue Farxiga  10 mg daily.  - Continue hydralazine  25 mg tid and Imdur  30 mg daily.  - Continue Coreg  3.125 mg bid.  - No ARB/ACEI/ARNI/spironolactone with CKD stage IV.  2. CKD stage IV: Recent BMET with creatinine 3.14.  3. Atrial fibrillation: Paroxysmal.  She appears to be holding NSR off amiodarone , now post-AF ablation.  - She will stay off amiodarone .  - She is planned for a Watchman.  Continue warfarin for now.  4. COPD: She is staying off cigarettes.  5. HTN: Associated with right renal atrophy on prior renal US . No changes to meds today.  6. Valvular heart disease: Severe MR and mod-severe TR on 3/24 echo.  Cardiac MRI showed only mild mitral regurgitation in setting of improved LV and RV function, and TEE in 12/24 showed only mild MR.  She does not have a prominent murmur.   Followup 6 months.    I spent 32 minutes reviewing records, interviewing/examining patient, and managing orders.   Dawn Sherman 03/21/2024

## 2024-03-24 ENCOUNTER — Ambulatory Visit: Attending: Cardiovascular Disease

## 2024-03-24 ENCOUNTER — Other Ambulatory Visit: Payer: Self-pay

## 2024-03-24 ENCOUNTER — Ambulatory Visit (INDEPENDENT_AMBULATORY_CARE_PROVIDER_SITE_OTHER): Admitting: Student in an Organized Health Care Education/Training Program

## 2024-03-24 ENCOUNTER — Encounter: Payer: Self-pay | Admitting: Student in an Organized Health Care Education/Training Program

## 2024-03-24 VITALS — BP 100/70 | HR 88 | Temp 98.2°F | Ht 65.0 in | Wt 116.8 lb

## 2024-03-24 DIAGNOSIS — Z7901 Long term (current) use of anticoagulants: Secondary | ICD-10-CM | POA: Diagnosis present

## 2024-03-24 DIAGNOSIS — R0602 Shortness of breath: Secondary | ICD-10-CM | POA: Diagnosis not present

## 2024-03-24 DIAGNOSIS — J432 Centrilobular emphysema: Secondary | ICD-10-CM | POA: Diagnosis not present

## 2024-03-24 DIAGNOSIS — I480 Paroxysmal atrial fibrillation: Secondary | ICD-10-CM | POA: Insufficient documentation

## 2024-03-24 LAB — POCT INR: INR: 1.7 — AB (ref 2.0–3.0)

## 2024-03-24 MED ORDER — FARXIGA 10 MG PO TABS: 10.0000 mg | ORAL_TABLET | Freq: Every day | ORAL | 3 refills | Status: AC

## 2024-03-24 NOTE — Assessment & Plan Note (Signed)
 Recent exacerbation likely viral, hospitalized, discharged on oxygen. Saturation improved to 95%. No dyspnea or chest pain. Daily activities without distress. Pulmonology follow-up needed. - Schedule pulmonology follow-up. - Labs ordered as outlined. - Continue albuterol  inhaler as needed.

## 2024-03-24 NOTE — Progress Notes (Signed)
 Assessment & Plan:   #Shortness of breath #History of Tobacco Use #Emphysema  Presents today for post hospital discharge follow up in the setting of history of smoking as well as HFpEF, Afib, and CKD. Patient was previously on oxygen which we discontinued after trending and overnight pulse oximetry was re-assuring. Despite multiple orders, she is yet to have PFT's to assess for COPD. She was recently admitted to the hospital with acute hypoxic respiratory failure secondary to viral infection, with concern for COPD exacerbation. Of note, she was enrolled in our lung cancer screening program and her imaging shows emphysema.  Today, she feels much improved and feels back at baseline. She is oxygenating well on room air, and is not using her oxygen at rest or with exertion. She continues to use it at night. Trended on room air in office and oxygenation remained above 90%. She is at high risk for COPD given her imaging and her history, but she has yet to obtain PFT's to assess for obstructive lung disease.  Today, I will re-order her PFT's to assess for COPD. I will start her on long acting inhalers should FEV1/FVC return < 0.7.  - Pulmonary Function Test; Future   Return in about 6 months (around 09/24/2024).  I spent 30 minutes caring for this patient today, including preparing to see the patient, obtaining a medical history , reviewing a separately obtained history, performing a medically appropriate examination and/or evaluation, counseling and educating the patient/family/caregiver, ordering medications, tests, or procedures, documenting clinical information in the electronic health record, and independently interpreting results (not separately reported/billed) and communicating results to the patient/family/caregiver  Belva November, MD [facility]   End of visit medications:  No orders of the defined types were placed in this encounter.    Current Outpatient  Medications:    acetaminophen  (TYLENOL ) 325 MG tablet, Take 650 mg by mouth every 6 (six) hours as needed (pain.)., Disp: , Rfl:    albuterol  (VENTOLIN  HFA) 108 (90 Base) MCG/ACT inhaler, Inhale 2 puffs into the lungs every 6 (six) hours as needed for wheezing or shortness of breath., Disp: 18 g, Rfl: 0   atorvastatin  (LIPITOR) 40 MG tablet, Take 1 tablet (40 mg total) by mouth daily., Disp: 90 tablet, Rfl: 0   buPROPion  (WELLBUTRIN  XL) 150 MG 24 hr tablet, Take 1 tablet (150 mg total) by mouth daily., Disp: 90 tablet, Rfl: 1   calcitRIOL  (ROCALTROL ) 0.25 MCG capsule, Take 0.25 mcg by mouth daily., Disp: , Rfl:    calcium  carbonate (OS-CAL - DOSED IN MG OF ELEMENTAL CALCIUM ) 1250 (500 Ca) MG tablet, Take 1 tablet (500 mg of elemental calcium  total) by mouth 3 (three) times daily with meals., Disp: 60 tablet, Rfl: 1   carvedilol  (COREG ) 3.125 MG tablet, Take 1 tablet (3.125 mg total) by mouth 2 (two) times daily., Disp: 180 tablet, Rfl: 3   hydrALAZINE  (APRESOLINE ) 25 MG tablet, Take 1 tablet (25 mg total) by mouth 3 (three) times daily., Disp: 90 tablet, Rfl: 5   isosorbide  mononitrate (IMDUR ) 30 MG 24 hr tablet, Take 1 tablet (30 mg total) by mouth daily., Disp: 90 tablet, Rfl: 3   levothyroxine  (SYNTHROID ) 75 MCG tablet, Take 1 tablet (75 mcg total) by mouth daily before breakfast., Disp: 30 tablet, Rfl: 1   loratadine  (CLARITIN ) 10 MG tablet, Take 10 mg by mouth daily., Disp: , Rfl:    omeprazole  (PRILOSEC) 20 MG capsule, Take 1 capsule (20 mg total) by mouth daily., Disp: ,  Rfl:    venlafaxine  XR (EFFEXOR -XR) 150 MG 24 hr capsule, Take 1 capsule (150 mg total) by mouth daily., Disp: 90 capsule, Rfl: 1   warfarin (COUMADIN ) 2.5 MG tablet, Take 2.5 mg by mouth daily. Patient takes 1 1/2 tablet on M,T,TH,F,Sat, Sun.  Takes 2 tablets on Wednesday., Disp: , Rfl:    FARXIGA  10 MG TABS tablet, Take 1 tablet (10 mg total) by mouth daily., Disp: 90 tablet, Rfl: 3   Subjective:   PATIENT ID: Dawn Sherman Kipper GENDER: female DOB: 1956/05/10, MRN: [id number]  Chief Complaint  Patient presents with   Follow-up    COPD, Pulmonary emphysema, CT: 02/25/24, X-ray: 02/23/24, no PFT  No current complaints at this time.     HPI  Patient is a pleasant 68 year old female with a history of CKD presenting for post hospital discharge follow up.  Patient is followed for CKD by Dr. Graylin, and was referred to [facility] for a transplant evaluation. She has a history of Afib and HFrecEF followed closely by Dr. Rolan. [facility] Aliene Riding, MD from nephrology). Given history of smoking and concern for COPD, as well as overnight oxygen use, she was referred to pulmonary for an evaluation, as oxygen use would preclude her transplant candidacy.  Return Visit 01/12/2024:  Patient was last seen in clinic in March 2025 at which point PFTs and an overnight pulse oximetry study were ordered.  She has since had her overnight pulse oximetry which was normal and did not show any signs of hypoxia and subsequently oxygen therapy was discontinued.  She was unable to get her PFTs nor was she reachable for lung cancer screening.  She was admitted in April to the hospital secondary to bradycardia in the setting of AKI and medication accumulation.  This has since resolved. She continues to feel well and is at her baseline in terms of shortness of breath.  She does not have any other respiratory symptoms and only reports very minimal exertional dyspnea.  She does not have any cough, wheeze, chest tightness, sputum production, or chest pain.  She is not on any inhalers.  Return Visit 03/24/2024:  Since our last visit, we obtained over night oximetry that was normal, and we discontinued her overnight oxygen. She was then admitted to the hospital with a Rhinovirus infection end of June 2025 resulted in acute hypoxic respiratory failure  concerning for COPD exacerbation. Patient has not yet scheduled her PFT's. Today, she feels back to baseline and has no symptoms. She was discharged home with oxygen, but is using it only nocturnally. She has no cough, wheeze, tightness, or shortness of breath. She has albuterol  ordered but does not use it. She feels back to baseline.      Patient reports a history of smoking but has quit. She reports smoking up to half a pack a day for over 40 years. Likely with 20 to 30 pack years of smoking history. Denies any occupational exposures.   Medical record reviewed in length.  -She had a history of alcohol abuse, managed with PCP with report of abstinence as far back as 2020.  -She was admitted to the hospital in June of 2021 with shortness of breath and acute decompensated HFrEF (EF 30%) and afib, managed with GDMT and Apixaban .  -Repeat echo a few months later showed EF recovery.  -She was re-admitted 07/2021 with shortness of  breath to the hospital with hypoxic respiratory failure felt to be secondary to a COPD exacerbation. She was discharged on oxygen (2L via [place]) with plan to follow up with PCP and pulmonary.  -She had a fall 11/2021 and admitted with finding of subarachnoid hemorrhage, noted to be stable on repeat imaging and managed conservatively. Fall felt to be secondary to second degree heart block in the setting of beta blocker use (discontinued).  -She was re-admitted July of 2023 due to decompensated heart failure and treated with IV diuretics.  -Another admission noted November of 2023 for hypoxic respiratory failure secondary to decompensated heart failure and Afib with RVR. She was discharged on room air during said admission. -Again admitted with hypoxia and shortness of breath January of 2024, treated for decompensated heart failure, and discharged home on 3L via [place] -admit 09/2022 with hypoxia, shortness of breath, found to have a pleural effusion (right sided) s/p  thoracentesis. -March/2024: admit with decompensated heart failure, required RHC x2 and aggressive diuresis with ionotropic support (milrinone ) -RHC 10/2022 RA 21, RV 62/24, PA 63/38 (46), PAOP 34, CO/CI 3.4/2, PVR 3.5 wood -RHC 11/2022 RA 6, RV 50/3, PA 50/17 (30), PAOP 16, LVEDP 15, CO/CI 6.08/3.7. LHC normal coronaries. -07/2023: afib ablatio (pulmonary vein isolation, posterior wall ablation, cavotricuspid isthums ablation for flutter) -Admission 11/2023 with Sinus bradycardia due to AKI and medication side effects -Admission 01/2024 with acute hypoxic respiratory failure due to rhinovirus felt to have triggered a COPD exacerbation  Ancillary information including prior medications, full medical/surgical/family/social histories, and PFTs (when available) are listed below and have been reviewed.   Review of Systems  Constitutional:  Negative for chills, fever, malaise/fatigue and weight loss.  Respiratory:  Negative for cough, hemoptysis, sputum production, shortness of breath and wheezing.   Cardiovascular:  Negative for chest pain.     Objective:   Vitals:   03/24/24 1044  BP: 100/70  Pulse: 88  Temp: 98.2 F (36.8 C)  TempSrc: Oral  SpO2: 94%  Weight: 116 lb 12.8 oz (53 kg)  Height: 5' 5 (1.651 m)   94% on RA. Trended on room air in clinic, maintained SpO2 of 92%  BMI Readings from Last 3 Encounters:  03/24/24 19.44 kg/m  03/19/24 19.34 kg/m  03/10/24 17.97 kg/m   Wt Readings from Last 3 Encounters:  03/24/24 116 lb 12.8 oz (53 kg)  03/19/24 116 lb 3.2 oz (52.7 kg)  03/10/24 108 lb (49 kg)    Physical Exam Constitutional:      Appearance: Normal appearance.  Cardiovascular:     Rate and Rhythm: Normal rate and regular rhythm.     Pulses: Normal pulses.     Heart sounds: Normal heart sounds.  Pulmonary:     Effort: Pulmonary effort is normal.     Breath sounds: Normal breath sounds. No wheezing or rhonchi.  Neurological:     General: No focal deficit  present.     Mental Status: She is alert and oriented to person, place, and time. Mental status is at baseline.       Ancillary Information    Past Medical History:  Diagnosis Date   B12 deficiency    Cardiomyopathy (HCC)    a. 09/2022 Echo: EF 35-40%; b. 09/2022 MV: apical defect w/ mild peri-infarct ischemia. EF 43%.   Chronic HFrEF (heart failure with reduced ejection fraction) (HCC)    a. 01/2020 Echo: EF 30-35%; b. 07/2020 Echo: EF 50-55%; c. 06/2022 Echo: EF 50-55%; d. 09/2022 Echo: EF 35-40%,  globh HK, mod reduced RV fxn, sev BAE, mod MR.   CKD (chronic kidney disease), stage IV (HCC)    COPD (chronic obstructive pulmonary disease) (HCC)    Depression    Endometriosis    Hypertension    Mixed hyperlipidemia    Moderate mitral regurgitation    Normocytic anemia    PAF (paroxysmal atrial fibrillation) (HCC)    a. CHA2DS2VASc = 4-->eliquis  2.5 bid.   Paroxymsal Atrial flutter (HCC)    Tobacco abuse      Family History  Problem Relation Age of Onset   Hypercholesterolemia Mother    Rheum arthritis Father    Rheum arthritis Daughter    Fibromyalgia Daughter    Breast cancer Neg Hx    Colon cancer Neg Hx      Past Surgical History:  Procedure Laterality Date   ABDOMINAL HYSTERECTOMY     ATRIAL FIBRILLATION ABLATION N/A 08/04/2023   Procedure: ATRIAL FIBRILLATION ABLATION;  Surgeon: Cindie Ole DASEN, MD;  Location: [facility] INVASIVE CV LAB;  Service: Cardiovascular;  Laterality: N/A;   BREAST BIOPSY Right 2015   neg-  FIBROADENOMA   CENTRAL LINE INSERTION  11/15/2022   Procedure: CENTRAL LINE INSERTION;  Surgeon: Mady Bruckner, MD;  Location: [facility] INVASIVE CV LAB;  Service: Cardiovascular;;   COLONOSCOPY WITH PROPOFOL  N/A 11/24/2022   Procedure: COLONOSCOPY WITH PROPOFOL ;  Surgeon: Maryruth Ole DASEN, MD;  Location: [facility] ENDOSCOPY;  Service: Endoscopy;  Laterality: N/A;   COLONOSCOPY WITH PROPOFOL  N/A 11/23/2022   Procedure: COLONOSCOPY WITH PROPOFOL ;  Surgeon: Maryruth Ole DASEN, MD;  Location: [facility] ENDOSCOPY;  Service: Endoscopy;  Laterality: N/A;   ESOPHAGOGASTRODUODENOSCOPY (EGD) WITH PROPOFOL  N/A 11/23/2022   Procedure: ESOPHAGOGASTRODUODENOSCOPY (EGD) WITH PROPOFOL ;  Surgeon: Maryruth Ole DASEN, MD;  Location: [facility] ENDOSCOPY;  Service: Endoscopy;  Laterality: N/A;   RIGHT HEART CATH N/A 11/15/2022   Procedure: RIGHT HEART CATH;  Surgeon: Mady Bruckner, MD;  Location: [facility] INVASIVE CV LAB;  Service: Cardiovascular;  Laterality: N/A;   RIGHT/LEFT HEART CATH AND CORONARY ANGIOGRAPHY N/A 11/25/2022   Procedure: RIGHT/LEFT HEART CATH AND CORONARY ANGIOGRAPHY;  Surgeon: Darron Deatrice LABOR, MD;  Location: [facility] INVASIVE CV LAB;  Service: Cardiovascular;  Laterality: N/A;   TAH/RSO  1999   secondary to bleeding and endometriosis (Dr Trula)   TRANSESOPHAGEAL ECHOCARDIOGRAM (CATH LAB) N/A 08/04/2023   Procedure: TRANSESOPHAGEAL ECHOCARDIOGRAM;  Surgeon: Cindie Ole DASEN, MD;  Location: [facility] INVASIVE CV LAB;  Service: Cardiovascular;  Laterality: N/A;    Social History   Socioeconomic History   Marital status: Married    Spouse name: Not on file   Number of children: Not on file   Years of education: Not on file   Highest education level: 12th grade  Occupational History   Not on file  Tobacco Use   Smoking status: Former    Current packs/day: 0.00    Average packs/day: 0.5 packs/day for 49.0 years (24.5 ttl pk-yrs)    Types: Cigarettes    Start date: 72    Quit date: 2024    Years since quitting: 1.5   Smokeless tobacco: Never   Tobacco comments:    Patient quit smoking recently \\Quit  6 months ago  Vaping Use   Vaping status: Never Used  Substance and Sexual Activity   Alcohol use: Not Currently    Alcohol/week: 0.0 standard drinks of alcohol    Comment: occasional   Drug use: No   Sexual activity: Not Currently  Other Topics Concern   Not on file  Social History Narrative   Lives in [place] with her husband.  She is retired from [employer].  She does not routinely exercise.   Social Drivers of Corporate investment banker Strain: Low Risk  (12/15/2023)   Overall Financial Resource Strain (CARDIA)    Difficulty of Paying Living Expenses: Not hard at all  Food Insecurity: No Food Insecurity (03/02/2024)   Hunger Vital Sign    Worried About Running Out of Food in the Last Year: Never true    Ran Out of Food in the Last Year: Never true  Transportation Needs: No Transportation Needs (03/02/2024)   PRAPARE - Administrator, Civil Service (Medical): No    Lack of Transportation (Non-Medical): No  Physical Activity: Inactive (12/15/2023)   Exercise Vital Sign    Days of Exercise per Week: 0 days    Minutes of Exercise per Session: 0 min  Stress: No Stress Concern Present (12/15/2023)   [employer] of Occupational Health - Occupational Stress Questionnaire    Feeling of Stress : Only a little  Social Connections: Socially Integrated (02/24/2024)   Social Connection and Isolation Panel    Frequency of Communication with Friends and Family: Three times a week    Frequency of Social Gatherings with Friends and Family: Three times a week    Attends Religious Services: 1 to 4 times per year    Active Member of Clubs or Organizations: Yes    Attends Banker Meetings: 1 to 4 times per year    Marital Status: Married  Catering manager Violence: Not At Risk (03/02/2024)   Humiliation, Afraid, Rape, and Kick questionnaire    Fear of Current or Ex-Partner: No    Emotionally Abused: No    Physically Abused: No    Sexually Abused: No     Allergies  Allergen Reactions   Sulfate Rash   Codeine Sulfate Nausea Only   Benicar [Olmesartan]     Talked with patient February 10, 2020, intolerance is unclear, tried several medications around that time and one of them gave her a rash but she is not clear which 1.   Amoxicillin Rash   Clindamycin/Lincomycin Rash   Entresto  [Sacubitril -Valsartan ] Other (See  Comments)    hyperkalemia   Morphine And Codeine Rash   Penicillins Rash     CBC    Component Value Date/Time   WBC 12.3 (H) 03/10/2024 1854   RBC 4.49 03/10/2024 1854   HGB 13.7 03/10/2024 1854   HGB 13.6 04/15/2023 1210   HCT 42.5 03/10/2024 1854   HCT 41.4 04/15/2023 1210   PLT 196 03/10/2024 1854   PLT 129 (L) 04/15/2023 1210   MCV 94.7 03/10/2024 1854   MCV 95 04/15/2023 1210   MCH 30.5 03/10/2024 1854   MCHC 32.2 03/10/2024 1854   RDW 13.2 03/10/2024 1854   RDW 12.7 04/15/2023 1210   LYMPHSABS 1.6 03/10/2024 1854   MONOABS 1.1 (H) 03/10/2024 1854   EOSABS 0.1 03/10/2024 1854   BASOSABS 0.0 03/10/2024 1854    Pulmonary Functions Testing Results:     No data to display          Outpatient Medications Prior to Visit  Medication Sig Dispense Refill   acetaminophen  (TYLENOL ) 325 MG tablet Take 650 mg by mouth every 6 (six) hours as needed (pain.).     albuterol  (VENTOLIN  HFA) 108 (90 Base) MCG/ACT inhaler Inhale 2 puffs into the lungs every 6 (six) hours as needed for  wheezing or shortness of breath. 18 g 0   atorvastatin  (LIPITOR) 40 MG tablet Take 1 tablet (40 mg total) by mouth daily. 90 tablet 0   buPROPion  (WELLBUTRIN  XL) 150 MG 24 hr tablet Take 1 tablet (150 mg total) by mouth daily. 90 tablet 1   calcitRIOL  (ROCALTROL ) 0.25 MCG capsule Take 0.25 mcg by mouth daily.     calcium  carbonate (OS-CAL - DOSED IN MG OF ELEMENTAL CALCIUM ) 1250 (500 Ca) MG tablet Take 1 tablet (500 mg of elemental calcium  total) by mouth 3 (three) times daily with meals. 60 tablet 1   carvedilol  (COREG ) 3.125 MG tablet Take 1 tablet (3.125 mg total) by mouth 2 (two) times daily. 180 tablet 3   hydrALAZINE  (APRESOLINE ) 25 MG tablet Take 1 tablet (25 mg total) by mouth 3 (three) times daily. 90 tablet 5   isosorbide  mononitrate (IMDUR ) 30 MG 24 hr tablet Take 1 tablet (30 mg total) by mouth daily. 90 tablet 3   levothyroxine  (SYNTHROID ) 75 MCG tablet Take 1 tablet (75 mcg total) by mouth  daily before breakfast. 30 tablet 1   loratadine  (CLARITIN ) 10 MG tablet Take 10 mg by mouth daily.     omeprazole  (PRILOSEC) 20 MG capsule Take 1 capsule (20 mg total) by mouth daily.     venlafaxine  XR (EFFEXOR -XR) 150 MG 24 hr capsule Take 1 capsule (150 mg total) by mouth daily. 90 capsule 1   warfarin (COUMADIN ) 2.5 MG tablet Take 2.5 mg by mouth daily. Patient takes 1 1/2 tablet on M,T,TH,F,Sat, Sun.  Takes 2 tablets on Wednesday.     FARXIGA  10 MG TABS tablet Take 10 mg by mouth daily.     No facility-administered medications prior to visit.

## 2024-03-24 NOTE — Patient Instructions (Signed)
 Take 3 tablets today only then Increase to 1.5 tablets daily, except 2 tablets every Wednesday and Friday.  INR in 1 week.   No leafy greens. [phone] Watchman 8/28  weekly INRs

## 2024-03-29 ENCOUNTER — Other Ambulatory Visit: Payer: Self-pay

## 2024-03-29 NOTE — Patient Instructions (Signed)
 Visit Information  Thank you for taking time to visit with me today. Please don't hesitate to contact me if I can be of assistance to you before our next scheduled appointment.  Your next care management appointment is by telephone on 05/03/2024 at 2:00 pm  Telephone follow-up in 1 month  Please call the care guide team at [phone] if you need to cancel, schedule, or reschedule an appointment.   Please call the Suicide and Crisis Lifeline: 988 call the [place]  National Suicide Prevention Lifeline: (646)625-2300 or TTY: (909)693-7915 TTY (872) 238-0979) to talk to a trained counselor call 1-800-273-TALK (toll free, 24 hour hotline) go to [facility] [address], [place] [id number]) call 911 if you are experiencing a Mental Health or Behavioral Health Crisis or need someone to talk to.  Nestora Duos, MSN, RN [place]  [facility], [facility] RN Care Manager Direct Dial: [phone] Fax: [phone]   Atrial Fibrillation Atrial fibrillation (AFib) is a type of heartbeat that is irregular or fast. If you have AFib, your heart beats without any order. This makes it hard for your heart to pump blood in a normal way. AFib may come and go, or it may become a long-lasting problem. If AFib is not treated, it can put you at higher risk for stroke, heart failure, and other heart problems. What are the causes? AFib may be caused by diseases that damage the heart's electrical system. They include: High blood pressure. Heart failure. Heart valve diseases. Heart surgery. Diabetes. Thyroid  disease. Kidney disease. Lung diseases, such as pneumonia or COPD. Sleep apnea. Sometimes the cause is not known. What increases the risk? You are more likely to develop AFib if: You are older. You exercise often and very hard. You have a family history of AFib. You are female. You are Caucasian. You are overweight. You smoke. You  drink a lot of alcohol. What are the signs or symptoms? Common symptoms of this condition include: A feeling that your heart is beating very fast. Chest pain or discomfort. Feeling short of breath. Suddenly feeling light-headed or weak. Getting tired easily during activity. Fainting. Sweating. In some cases, there are no symptoms. How is this treated? Medicines to: Prevent blood clots. Treat heart rate or heart rhythm problems. Using devices, such as a pacemaker, to correct heart rhythm problems. Doing surgery to remove the part of the heart that sends bad signals. Closing an area where clots can form in the heart (left atrial appendage). In some cases, your doctor will treat other underlying conditions. Follow these instructions at home: Medicines Take over-the-counter and prescription medicines only as told by your doctor. Do not take any new medicines without first talking to your doctor. If you are taking blood thinners: Talk with your doctor before taking aspirin  or NSAIDs, such as ibuprofen. Take your medicines as told. Take them at the same time each day. Do not do things that could hurt or bruise you. Be careful to avoid falls. Wear an alert bracelet or carry a card that says you take blood thinners. Lifestyle Do not smoke or use any products that contain nicotine  or tobacco. If you need help quitting, ask your doctor. Eat heart-healthy foods. Talk with your doctor about the right eating plan for you. Exercise regularly as told by your doctor. Do not drink alcohol. Lose weight if you are overweight. General instructions If you have sleep apnea, treat it as told by your doctor. Do not use diet pills unless your  doctor says they are safe for you. Diet pills may make heart problems worse. Keep all follow-up visits. Your doctor will check your heart rate and rhythm regularly. Contact a doctor if: You notice a change in the speed, rhythm, or strength of your heartbeat. You  are taking a blood-thinning medicine and you get more bruising. You get tired more easily when you move or exercise. You have a sudden change in weight. Get help right away if:  You have pain in your chest. You have trouble breathing. You have side effects of blood thinners, such as blood in your vomit, poop (stool), or pee (urine), or bleeding that cannot stop. You have any signs of a stroke. BE FAST is an easy way to remember the main warning signs: B - Balance. Dizziness, sudden trouble walking, or loss of balance. E - Eyes. Trouble seeing or a change in how you see. F - Face. Sudden weakness or loss of feeling in the face. The face or eyelid may droop on one side. A - Arms.Weakness or loss of feeling in an arm. This happens suddenly and usually on one side of the body. S - Speech. Sudden trouble speaking, slurred speech, or trouble understanding what people say. T - Time.Time to call emergency services. Write down what time symptoms started. You have other signs of a stroke, such as: A sudden, very bad headache with no known cause. Feeling like you may vomit (nausea). Vomiting. A seizure. These symptoms may be an emergency. Get help right away. Call 911. Do not wait to see if the symptoms will go away. Do not drive yourself to the hospital. This information is not intended to replace advice given to you by your health care provider. Make sure you discuss any questions you have with your health care provider. Document Revised: 05/01/2022 Document Reviewed: 05/01/2022 Elsevier Patient Education  2024 Elsevier Inc.  Heart Failure Action Plan A heart failure action plan helps you know what to do when you have symptoms of heart failure. Your action plan is a color-coded plan that lists the symptoms to watch for and indicates what actions to take. If you have symptoms in the green zone, you're doing well. If you have symptoms in the yellow zone, you're having problems. If you have  symptoms in the red zone, you need medical care right away. Follow the plan that was created by you and your health care provider. Review your plan each time you visit your provider. Green zone These signs mean you're doing well and can continue what you're doing: You don't have new or worsening shortness of breath. You have very little swelling or no new swelling. Your weight is stable (no gain or loss). You have a normal activity level. You don't have chest pain or any other new symptoms. Yellow zone These signs and symptoms mean your condition may be getting worse and you should make some changes: You have trouble breathing when you're active. You have swelling in your feet or legs or have discomfort in your belly. You gain 2-3 lb (0.9-1.4 kg) in 24 hours, or 5 lb (2.3 kg) in a week. This amount may be more or less depending on your condition. You get tired easily. You have trouble sleeping. You have a dry cough. If you have any of these symptoms: Contact your provider within the next day. Your provider may adjust your medicines. Red zone These signs and symptoms mean you should get medical help right away: You have trouble breathing  when resting or cannot lie flat and you need to raise your head to help you breathe. You have a dry cough that's getting worse. You have swelling or pain in your feet or legs or discomfort in your belly that's getting worse. You suddenly gain more than 2-3 lb (0.9-1.4 kg) in 24 hours, or more than 5 lb (2.3 kg) in a week. This amount may be more or less depending on your condition. You have trouble staying awake or you feel confused. You don't have an appetite. You have worsening sadness or depression. These symptoms may be an emergency. Call 911 right away. Do not wait to see if the symptoms will go away. Do not drive yourself to the hospital. Follow these instructions at home: Take medicines only as told. Eat a heart-healthy diet. Work with a  dietitian to create an eating plan that's best for you. Weigh yourself each day. Your target weight is __________ lb (__________ kg). Call your provider if you gain more than __________ lb (__________ kg) in 24 hours, or more than __________ lb (__________ kg) in a week. Health care provider name: _____________________________________________________ Health care provider phone number: _____________________________________________________ Where to find more information American Heart Association: heart.org This information is not intended to replace advice given to you by your health care provider. Make sure you discuss any questions you have with your health care provider. Document Revised: 03/27/2023 Document Reviewed: 03/27/2023 Elsevier Patient Education  2024 Elsevier Inc.  COPD Action Plan A COPD action plan is a description of what to do when you have a flare (exacerbation) of chronic obstructive pulmonary disease (COPD). Your action plan is a color-coded plan that lists the symptoms that indicate whether your condition is under control and what actions to take. If you have symptoms in the green zone, it means you are doing well that day. If you have symptoms in the yellow zone, it means you are having a bad day or an exacerbation. If you have symptoms in the red zone, you need urgent medical care. Follow the plan that you and your health care provider developed. Review your plan with your health care provider at each visit. Red zone Symptoms in this zone mean that you should get medical help right away. They include: Feeling very short of breath, even when you are resting. Not being able to do any activities because of poor breathing. Not being able to sleep because of poor breathing. Fever or shaking chills. Feeling confused or very sleepy. Chest pain. Coughing up blood. If you have any of these symptoms, call emergency services (911 in the [place] or go to the nearest emergency  room. Yellow zone Symptoms in this zone mean that your condition may be getting worse. They include: Feeling more short of breath than usual. Having less energy for daily activities than usual. Phlegm or mucus that is thicker than usual. Needing to use your rescue inhaler or nebulizer more often than usual. More ankle swelling than usual. Coughing more than usual. Feeling like you have a chest cold. Trouble sleeping due to COPD symptoms. Decreased appetite. COPD medicines not helping as much as usual. If you experience any yellow symptoms: Keep taking your daily medicines as directed. Use your quick-relief inhaler as told by your health care provider. If you were prescribed steroid medicine to take by mouth (oral medicine), start taking it as told by your health care provider. If you were prescribed an antibiotic medicine, start taking it as told by your health care  provider. Do not stop taking the antibiotic even if you start to feel better. Use oxygen as told by your health care provider. Get more rest. Do your pursed-lip breathing exercises. Do not smoke. Avoid any irritants in the air. If your signs and symptoms do not improve after taking these steps, call your health care provider right away. Green zone Symptoms in this zone mean that you are doing well. They include: Being able to do your usual activities and exercise. Having the usual amount of coughing, including the same amount of phlegm or mucus. Being able to sleep well. Having a good appetite. Where to find more information: You can find more information about COPD from: American Lung Association, My COPD Action Plan: www.lung.org COPD Foundation: www.copdfoundation.org National Heart, Lung, & Blood Institute: PopSte[employer].is Follow these instructions at home: Continue taking your daily medicines as told by your health care provider. Make sure you receive all the immunizations that your health care provider  recommends, especially the pneumococcal and influenza vaccines. Wash your hands often with soap and water . Have family members wash their hands too. Regular hand washing can help prevent infections. Follow your usual exercise and diet plan. Avoid irritants in the air, such as smoke. Do not use any products that contain nicotine  or tobacco. These products include cigarettes, chewing tobacco, and vaping devices, such as e-cigarettes. If you need help quitting, ask your health care provider. Summary A COPD action plan tells you what to do when you have a flare (exacerbation) of chronic obstructive pulmonary disease (COPD). Follow each action plan for your symptoms. If you have any symptoms in the red zone, call emergency services (911 in the [place] or go to the nearest emergency room. This information is not intended to replace advice given to you by your health care provider. Make sure you discuss any questions you have with your health care provider. Document Revised: 06/26/2023 Document Reviewed: 06/26/2023 Elsevier Patient Education  2024 [employer].

## 2024-03-29 NOTE — Patient Outreach (Signed)
 Complex Care Management   Visit Note  03/29/2024  Name:  Dawn Sherman MRN: [id number] DOB: Jan 03, 1956  Situation: Referral received for Complex Care Management related to Heart Failure and COPD I obtained verbal consent from Patient.  Visit completed with Patient  on the phone  Background:   Past Medical History:  Diagnosis Date   B12 deficiency    Cardiomyopathy (HCC)    a. 09/2022 Echo: EF 35-40%; b. 09/2022 MV: apical defect w/ mild peri-infarct ischemia. EF 43%.   Chronic HFrEF (heart failure with reduced ejection fraction) (HCC)    a. 01/2020 Echo: EF 30-35%; b. 07/2020 Echo: EF 50-55%; c. 06/2022 Echo: EF 50-55%; d. 09/2022 Echo: EF 35-40%, globh HK, mod reduced RV fxn, sev BAE, mod MR.   CKD (chronic kidney disease), stage IV (HCC)    COPD (chronic obstructive pulmonary disease) (HCC)    Depression    Endometriosis    Hypertension    Mixed hyperlipidemia    Moderate mitral regurgitation    Normocytic anemia    PAF (paroxysmal atrial fibrillation) (HCC)    a. CHA2DS2VASc = 4-->eliquis  2.5 bid.   Paroxymsal Atrial flutter (HCC)    Tobacco abuse     Assessment: Patient Reported Symptoms:  Cognitive Cognitive Status: Alert and oriented to person, place, and time, Insightful and able to interpret abstract concepts, Normal speech and language skills Cognitive/Intellectual Conditions Management [RPT]: None reported or documented in medical history or problem list   Health Maintenance Behaviors: Annual physical exam, Sleep adequate, Healthy diet, Exercise Healing Pattern: Average Health Facilitated by: Healthy diet, Rest  Neurological Neurological Review of Symptoms: No symptoms reported Neurological Management Strategies: Routine screening  HEENT HEENT Symptoms Reported: No symptoms reported HEENT Management Strategies: Routine screening    Cardiovascular Cardiovascular Symptoms Reported: No symptoms reported Does patient have uncontrolled Hypertension?: No Cardiovascular  Management Strategies: Medication therapy, Routine screening Weight: 118 lb (53.5 kg) (Patient reported) Cardiovascular Comment: No HF symptoms  Respiratory Respiratory Symptoms Reported: No symptoms reported Other Respiratory Symptoms: respiratory illness resolved Respiratory Management Strategies: Adequate rest, Oxygen therapy, Routine screening, Medication therapy  Endocrine Endocrine Symptoms Reported: No symptoms reported Is patient diabetic?: No    Gastrointestinal Gastrointestinal Symptoms Reported: No symptoms reported      Genitourinary Genitourinary Symptoms Reported: No symptoms reported Genitourinary Comment: no recent visits - no plan change  Integumentary Integumentary Symptoms Reported: No symptoms reported Skin Management Strategies: Routine screening  Musculoskeletal Musculoskelatal Symptoms Reviewed: No symptoms reported Musculoskeletal Management Strategies: Routine screening Falls in the past year?: No Number of falls in past year: 1 or less Was there an injury with Fall?: No Fall Risk Category Calculator: 0 Patient Fall Risk Level: Low Fall Risk Patient at Risk for Falls Due to: History of fall(s) Fall risk Follow up: Falls evaluation completed, Falls prevention discussed  Psychosocial Psychosocial Symptoms Reported: No symptoms reported Behavioral Management Strategies: Medication therapy Behavioral Health Self-Management Outcome: 4 (good) Major Change/Loss/Stressor/Fears (CP): Medical condition, self Techniques to Cope with Loss/Stress/Change: Diversional activities, Medication Quality of Family Relationships: supportive Do you feel physically threatened by others?: No      03/29/2024    2:14 PM  Depression screen PHQ 2/9  Decreased Interest 0  Down, Depressed, Hopeless 0  PHQ - 2 Score 0    Vitals:   03/29/24 1410  BP: 130/75  SpO2: 96%    Medications Reviewed Today     Reviewed by Devra Lands, RN (Registered Nurse) on 03/29/24 at 1407  Med  List Status: <  None>   Medication Order Taking? Sig Documenting Provider Last Dose Status Informant  acetaminophen  (TYLENOL ) 325 MG tablet [id number] Yes Take 650 mg by mouth every 6 (six) hours as needed (pain.). [provider]  Active Self, Spouse/Significant Other           Med Note CATHY, OVAL VEAR Repress Dec 07, 2023  3:43 PM) prn  albuterol  (VENTOLIN  HFA) 108 (90 Base) MCG/ACT inhaler [id number] Yes Inhale 2 puffs into the lungs every 6 (six) hours as needed for wheezing or shortness of breath. Glendia Shad, MD  Active Spouse/Significant Other  atorvastatin  (LIPITOR) 40 MG tablet [id number] Yes Take 1 tablet (40 mg total) by mouth daily. Glendia Shad, MD  Active Spouse/Significant Other  buPROPion  (WELLBUTRIN  XL) 150 MG 24 hr tablet [id number] Yes Take 1 tablet (150 mg total) by mouth daily. Glendia Shad, MD  Active Spouse/Significant Other  calcitRIOL  (ROCALTROL ) 0.25 MCG capsule [id number] Yes Take 0.25 mcg by mouth daily. [provider]  Active Self, Spouse/Significant Other  calcium  carbonate (OS-CAL - DOSED IN MG OF ELEMENTAL CALCIUM ) 1250 (500 Ca) MG tablet [id number] Yes Take 1 tablet (500 mg of elemental calcium  total) by mouth 3 (three) times daily with meals. Patel, Sona, MD  Active Self, Spouse/Significant Other  carvedilol  (COREG ) 3.125 MG tablet [id number] Yes Take 1 tablet (3.125 mg total) by mouth 2 (two) times daily. Riddle, Suzann, NP  Active Spouse/Significant Other  FARXIGA  10 MG TABS tablet [id number] Yes Take 1 tablet (10 mg total) by mouth daily. Glendia Shad, MD  Active   hydrALAZINE  (APRESOLINE ) 25 MG tablet [id number] Yes Take 1 tablet (25 mg total) by mouth 3 (three) times daily. Rolan Ezra RAMAN, MD  Active Self, Spouse/Significant Other  isosorbide  mononitrate (IMDUR ) 30 MG 24 hr tablet [id number] Yes Take 1 tablet (30 mg total) by mouth daily. Rolan Ezra RAMAN, MD  Active Self, Spouse/Significant Other  levothyroxine  (SYNTHROID ) 75 MCG tablet  [id number] Yes Take 1 tablet (75 mcg total) by mouth daily before breakfast. Glendia Shad, MD  Active Spouse/Significant Other  loratadine  (CLARITIN ) 10 MG tablet [id number] Yes Take 10 mg by mouth daily. [provider]  Active Self, Spouse/Significant Other  omeprazole  (PRILOSEC) 20 MG capsule [id number] Yes Take 1 capsule (20 mg total) by mouth daily. Riddle, Suzann, NP  Active Self, Spouse/Significant Other  venlafaxine  XR (EFFEXOR -XR) 150 MG 24 hr capsule [id number] Yes Take 1 capsule (150 mg total) by mouth daily. Glendia Shad, MD  Active Spouse/Significant Other  warfarin (COUMADIN ) 2.5 MG tablet [id number] Yes Take 2.5 mg by mouth daily. Patient takes 1 1/2 tablet on M,T,TH,F,Sat, Sun.  Takes 2 tablets on Wednesday. [provider]  Active Self, Spouse/Significant Other            Recommendation:   PCP Follow-up Continue Current Plan of Care  Follow Up Plan:   Telephone follow-up in 1 month  Nestora Duos, MSN, RN [facility]  [facility], [facility] RN Care Manager Direct Dial: [phone] Fax: [phone]

## 2024-03-31 ENCOUNTER — Ambulatory Visit: Attending: Cardiovascular Disease

## 2024-03-31 DIAGNOSIS — Z7901 Long term (current) use of anticoagulants: Secondary | ICD-10-CM | POA: Insufficient documentation

## 2024-03-31 DIAGNOSIS — I480 Paroxysmal atrial fibrillation: Secondary | ICD-10-CM | POA: Diagnosis present

## 2024-03-31 LAB — POCT INR: INR: 1.8 — AB (ref 2.0–3.0)

## 2024-03-31 NOTE — Patient Instructions (Signed)
 Take 1 more tablet today only then Increase to 2 tablets daily, except 1.5 tablets every Monday, Wednesday and Friday.  INR in 1 week.   No leafy greens. [phone] Watchman 8/28  weekly INRs

## 2024-04-02 ENCOUNTER — Ambulatory Visit: Admitting: Student in an Organized Health Care Education/Training Program

## 2024-04-02 DIAGNOSIS — R0602 Shortness of breath: Secondary | ICD-10-CM | POA: Diagnosis not present

## 2024-04-02 LAB — PULMONARY FUNCTION TEST
DL/VA % pred: 69 %
DL/VA: 2.87 ml/min/mmHg/L
DLCO cor % pred: 55 %
DLCO cor: 11.11 ml/min/mmHg
DLCO unc % pred: 55 %
DLCO unc: 11.04 ml/min/mmHg
FEF 25-75 Post: 0.8 L/s
FEF 25-75 Pre: 0.48 L/s
FEF2575-%Change-Post: 65 %
FEF2575-%Pred-Post: 39 %
FEF2575-%Pred-Pre: 23 %
FEV1-%Change-Post: 20 %
FEV1-%Pred-Post: 51 %
FEV1-%Pred-Pre: 42 %
FEV1-Post: 1.24 L
FEV1-Pre: 1.03 L
FEV1FVC-%Change-Post: 5 %
FEV1FVC-%Pred-Pre: 71 %
FEV6-%Change-Post: 15 %
FEV6-%Pred-Post: 69 %
FEV6-%Pred-Pre: 60 %
FEV6-Post: 2.13 L
FEV6-Pre: 1.84 L
FEV6FVC-%Change-Post: 0 %
FEV6FVC-%Pred-Post: 103 %
FEV6FVC-%Pred-Pre: 102 %
FVC-%Change-Post: 14 %
FVC-%Pred-Post: 67 %
FVC-%Pred-Pre: 58 %
FVC-Post: 2.15 L
FVC-Pre: 1.88 L
Post FEV1/FVC ratio: 58 %
Post FEV6/FVC ratio: 99 %
Pre FEV1/FVC ratio: 55 %
Pre FEV6/FVC Ratio: 98 %
RV % pred: 185 %
RV: 4.08 L
TLC % pred: 121 %
TLC: 6.33 L

## 2024-04-02 NOTE — Progress Notes (Signed)
 Full PFT completed today ? ?

## 2024-04-02 NOTE — Patient Instructions (Signed)
 Full PFT completed today ? ?

## 2024-04-05 ENCOUNTER — Other Ambulatory Visit: Payer: Self-pay | Admitting: Internal Medicine

## 2024-04-07 ENCOUNTER — Ambulatory Visit: Attending: Cardiovascular Disease

## 2024-04-07 DIAGNOSIS — Z7901 Long term (current) use of anticoagulants: Secondary | ICD-10-CM | POA: Insufficient documentation

## 2024-04-07 DIAGNOSIS — I480 Paroxysmal atrial fibrillation: Secondary | ICD-10-CM

## 2024-04-07 LAB — POCT INR: INR: 2 (ref 2.0–3.0)

## 2024-04-07 NOTE — Patient Instructions (Signed)
 Increase to 2 tablets daily, except 1.5 tablets every  Wednesday.  INR in 1 week.   No leafy greens. [phone] Watchman 8/28  weekly INRs

## 2024-04-08 ENCOUNTER — Other Ambulatory Visit: Payer: Self-pay

## 2024-04-08 DIAGNOSIS — D509 Iron deficiency anemia, unspecified: Secondary | ICD-10-CM

## 2024-04-08 DIAGNOSIS — E538 Deficiency of other specified B group vitamins: Secondary | ICD-10-CM

## 2024-04-09 ENCOUNTER — Inpatient Hospital Stay: Attending: Oncology

## 2024-04-09 ENCOUNTER — Encounter: Payer: Self-pay | Admitting: Oncology

## 2024-04-09 ENCOUNTER — Inpatient Hospital Stay: Admitting: Oncology

## 2024-04-10 NOTE — Progress Notes (Unsigned)
 Electrophysiology Clinic Note    Date:  04/12/2024  Patient ID:  Dawn Sherman, Dawn Sherman July 22, 1956, MRN [id number] PCP:  Glendia Shad, MD  Cardiologist:  Evalene Lunger, MD HF Cardiologist: Ezra Shuck, MD Electrophysiologist: Dawn ONEIDA HOLTS, MD   Discussed the use of AI scribe software for clinical note transcription with the patient, who gave verbal consent to proceed.   Patient Profile    Chief Complaint: pre-op  LAAO   History of Present Illness: Dawn Sherman is a 68 y.o. female with PMH notable for parox AFib, HFimpEF, COPD, MR, HTN, HLD, CKD-stage 4, hypothyroid, anemia; seen today for Dawn ONEIDA HOLTS, MD for pre-procedure eval prior to LAAO.   Her previous HF was thought to be d/t tachy-mediated from AFib.  She is s/p AFib ablation w PVI, posterior wall, and CTI on 08/04/2023 by Dr. HOLTS.  She was hospitalized 11/2023 with AKI, supratherapeutic INR after N/V/D x 10 days. She was in 2:1 HB during hospitalization with improvement in conduction after BB and amiodarone  were held. LAAO was scheduled, but postponed because of hospitaliztion.   Her LAAO is now scheduled for 8/28.   On follow-up today, she has not had any AF episodes since ablation. She has not had any presyncope or syncopal episodes since hospitalization in April. She continues to take warfarin without any obvious bleeding through GI/GU tracts. She has noticed that she is constipated when warfarin dose is increased.   She denies chest pain, chest pressure, palpations, or SOB     AAD History: Amiodarone   - stopped 09/2023 d/t 2:1 HB    ROS:  Please see the history of present illness. All other systems are reviewed and otherwise negative.    Physical Exam    VS:  BP (!) 160/88 (BP Location: Left Arm, Patient Position: Sitting, Cuff Size: Normal)   Pulse 77   Ht 5' 5 (1.651 m)   Wt 119 lb 6.4 oz (54.2 kg)   SpO2 94%   BMI 19.87 kg/m  BMI: Body mass index is 19.87 kg/m.  Wt Readings from  Last 3 Encounters:  04/12/24 119 lb 6.4 oz (54.2 kg)  04/02/24 118 lb 9.6 oz (53.8 kg)  03/29/24 118 lb (53.5 kg)    GEN- The patient is well appearing, alert and oriented x 3 today.   Lungs- Clear to ausculation bilaterally, normal work of breathing.  Heart- Regular rate and rhythm, no murmurs, rubs or gallops Extremities- No peripheral edema, warm, dry    Studies Reviewed   Previous EP, cardiology notes.    EKG is ordered. Personal review of EKG from today shows:    EKG Interpretation Date/Time:  Monday April 12 2024 09:28:50 EDT Ventricular Rate:  77 PR Interval:  278 QRS Duration:  92 QT Interval:  314 QTC Calculation: 355 R Axis:   44  Text Interpretation: Sinus rhythm with 1st degree A-V block Left ventricular hypertrophy with repolarization abnormality ( Cornell product ) Confirmed by Inetta Dicke [phone]) on 04/12/2024 9:45:16 AM    Long term monitor, 01/08/2024 HR 31 - 126, average 80 bpm. 1 episode of heart block lasting 7 seconds, nocturnal. 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 Wenckebach present. Rare supraventricular and ventricular ectopy. No sustained arrhythmias. No atrial fibrillation.  TTE, 11/27/2023  1. Left ventricular ejection fraction, by estimation, is 55 to 60%. The left ventricle has normal function. The left ventricle has no regional wall motion abnormalities. There is moderate left ventricular hypertrophy.  Left ventricular diastolic parameters were normal.   2. Right ventricular systolic function is normal. The right ventricular size is normal. Tricuspid regurgitation signal is inadequate for assessing PA pressure.   3. Left atrial size was mildly dilated.   4. The mitral valve is normal in structure. Mild mitral valve regurgitation. No evidence of mitral stenosis.   5. The aortic valve is normal in structure. Aortic valve regurgitation is not visualized. No aortic stenosis is present.   6. The  inferior vena cava is normal in size with greater than 50% respiratory variability, suggesting right atrial pressure of 3 mmHg.   Comparison(s): A prior study was performed on 11/13/2012. Changes from prior study are noted. The left ventricular function has improved.   TEE, 08/04/2023  1. Left ventricular ejection fraction, by estimation, is 50 to 55%. The left ventricle has low normal function.   2. Right ventricular systolic function is normal. The right ventricular size is normal.   3. Left atrial size was mildly dilated. No left atrial/left atrial appendage thrombus was detected.   4. The mitral valve is grossly normal. Mild mitral valve regurgitation. No evidence of mitral stenosis.   5. The aortic valve is normal in structure. Aortic valve regurgitation is trivial. No aortic stenosis is present.   6. There is mild (Grade II) plaque involving the descending aorta.   7. Continuous sedation prior to abalation procedure.   Cardiac MRI, 04/02/2023 1.  Normal LV size, mildly reduced LV systolic function.  LVEF 47%.  2.  There is no LGE or scar.  3.  No evidence for myocardial infiltration.  4.  Mildly elevated ECV value.  5.  Normal RV size and function.  6.  Findings suggest non ischemic cardiomyopathy.  TTE, 11/14/2022 1. Left ventricular ejection fraction, by estimation, is 25 to 30%. The left ventricle has severely decreased function. The left ventricle demonstrates global hypokinesis. There is moderate left ventricular hypertrophy. Left ventricular diastolic parameters are indeterminate.   2. Right ventricular systolic function is moderately reduced. The right ventricular size is normal. There is moderately elevated pulmonary artery systolic pressure. The estimated right ventricular systolic pressure is 45.7 mmHg.   3. Left atrial size was severely dilated.   4. A small pericardial effusion is present.   5. The mitral valve is normal in structure. Severe mitral valve regurgitation. No  evidence of mitral stenosis.   6. Tricuspid valve regurgitation is moderate to severe.   7. The aortic valve is normal in structure. Aortic valve regurgitation is not visualized. No aortic stenosis is present.   8. The inferior vena cava is normal in size with greater than 50% respiratory variability, suggesting right atrial pressure of 3 mmHg.    Assessment and Plan    #) parox AFib No further syncopal episodes Maintaining sinus rhythm s/p AF ablation    #) Hypercoag d/t parox afib #) anemia CHA2DS2-VASc Score = at least 5 [CHF History: 1, HTN History: 1, Diabetes History: 0, Stroke History: 0, Vascular Disease History: 1, Age Score: 1, Gender Score: 1].  Therefore, the patient's annual risk of stroke is 7.2 %.    Stroke ppx - warfarin, managed at [facility] office LAAO procedure scheduled 8/28 with Dr. Cindie Pre-procedure labwork today No dental work for 45d post-procedure   #) HFimpEF Follows with HF team Appears euvolemic   Current medicines are reviewed at length with the patient today.   The patient has concerns regarding her medicines.  The following changes were made  today:   None   Labs/ tests ordered today include:  Orders Placed This Encounter  Procedures   CBC   Basic metabolic panel with GFR   Magnesium    EKG 12-Lead     Disposition: Follow up with Dr. Cindie or EP APP as usual post procedure,    Signed, Chantal Needle, NP  04/12/24  12:45 PM  Electrophysiology [facility]

## 2024-04-12 ENCOUNTER — Ambulatory Visit: Attending: Cardiology | Admitting: Cardiology

## 2024-04-12 VITALS — BP 160/88 | HR 77 | Ht 65.0 in | Wt 119.4 lb

## 2024-04-12 DIAGNOSIS — D509 Iron deficiency anemia, unspecified: Secondary | ICD-10-CM | POA: Diagnosis present

## 2024-04-12 DIAGNOSIS — I480 Paroxysmal atrial fibrillation: Secondary | ICD-10-CM | POA: Insufficient documentation

## 2024-04-12 DIAGNOSIS — D6869 Other thrombophilia: Secondary | ICD-10-CM | POA: Diagnosis present

## 2024-04-12 DIAGNOSIS — Z7901 Long term (current) use of anticoagulants: Secondary | ICD-10-CM | POA: Diagnosis present

## 2024-04-12 NOTE — Patient Instructions (Signed)
 Medication Instructions:  Your physician recommends that you continue on your current medications as directed. Please refer to the Current Medication list given to you today.  *If you need a refill on your cardiac medications before your next appointment, please call your pharmacy*  Lab Work: Your provider would like for you to have following labs drawn today CBC, BMP, MAG.   If you have labs (blood work) drawn today and your tests are completely normal, you will receive your results only by: MyChart Message (if you have MyChart) OR A paper copy in the mail If you have any lab test that is abnormal or we need to change your treatment, we will call you to review the results.  Follow-Up: At [facility], you and your health needs are our priority.  As part of our continuing mission to provide you with exceptional heart care, our providers are all part of one team.  This team includes your primary Cardiologist (physician) and Advanced Practice Providers or APPs (Physician Assistants and Nurse Practitioners) who all work together to provide you with the care you need, when you need it.  Your next appointment:   They will call for follow up appointments   We recommend signing up for the patient portal called MyChart.  Sign up information is provided on this After Visit Summary.  MyChart is used to connect with patients for Virtual Visits (Telemedicine).  Patients are able to view lab/test results, encounter notes, upcoming appointments, etc.  Non-urgent messages can be sent to your provider as well.   To learn more about what you can do with MyChart, go to [email or web address].

## 2024-04-13 ENCOUNTER — Telehealth: Payer: Self-pay | Admitting: Student in an Organized Health Care Education/Training Program

## 2024-04-13 ENCOUNTER — Other Ambulatory Visit: Payer: Self-pay

## 2024-04-13 ENCOUNTER — Ambulatory Visit: Payer: Self-pay | Admitting: Cardiology

## 2024-04-13 ENCOUNTER — Other Ambulatory Visit
Admission: RE | Admit: 2024-04-13 | Discharge: 2024-04-13 | Disposition: A | Discharge status: Home / Self Care | Attending: Cardiology | Admitting: Cardiology

## 2024-04-13 DIAGNOSIS — E875 Hyperkalemia: Secondary | ICD-10-CM | POA: Insufficient documentation

## 2024-04-13 LAB — POTASSIUM: Potassium: 5.8 mmol/L — ABNORMAL HIGH (ref 3.5–5.1)

## 2024-04-13 LAB — CBC
Hematocrit: 41.8 % (ref 34.0–46.6)
Hemoglobin: 13.1 g/dL (ref 11.1–15.9)
MCH: 30.5 pg (ref 26.6–33.0)
MCHC: 31.3 g/dL — ABNORMAL LOW (ref 31.5–35.7)
MCV: 97 fL (ref 79–97)
Platelets: 162 x10E3/uL (ref 150–450)
RBC: 4.29 x10E6/uL (ref 3.77–5.28)
RDW: 13 % (ref 11.7–15.4)
WBC: 6.8 x10E3/uL (ref 3.4–10.8)

## 2024-04-13 LAB — BASIC METABOLIC PANEL WITH GFR
BUN/Creatinine Ratio: 13 (ref 12–28)
BUN: 41 mg/dL — ABNORMAL HIGH (ref 8–27)
CO2: 21 mmol/L (ref 20–29)
Calcium: 8.8 mg/dL (ref 8.7–10.3)
Chloride: 101 mmol/L (ref 96–106)
Creatinine, Ser: 3.08 mg/dL — ABNORMAL HIGH (ref 0.57–1.00)
Glucose: 89 mg/dL (ref 70–99)
Potassium: 6.2 mmol/L (ref 3.5–5.2)
Sodium: 142 mmol/L (ref 134–144)
eGFR: 16 mL/min/1.73 — ABNORMAL LOW (ref >59)

## 2024-04-13 LAB — MAGNESIUM: Magnesium: 2.2 mg/dL (ref 1.6–2.3)

## 2024-04-13 NOTE — Telephone Encounter (Signed)
 Spoke with Dawn Sherman husband. They are not interested in coming to the ED for repeat K check. Our team will reach out to them this morning to coordinate repeat labs in clinic. They understand that if the K is still 6.2 or if higher there is a risk of life threatening arrhythmias and waiting for clinic will delay recheck.

## 2024-04-13 NOTE — Telephone Encounter (Signed)
 OP labs with K 6.2, sample not hemolyzed. sCr 3.08, BUN 41, GFR 16. She is followed by nephrology for CKD and her sCr was similar on last check. Also with ITP. She is being evaluated for kidney transplant at [facility] and referred for dialysis options class. Seen by Chantal Needle. NP yesterday for pre procedure Watchman evaluation for Dr. Cindie. She is scheduled 08/28 for Watchman implant. Fortunately without recurrent AF/AFL recurrence s/p ablation.   She will need to come to ED to get K rechecked. I am concerned that this is a real with her known CKD4. She has been taken off all medications that could contribute to hyperK already as she has had elevated levels before. Will call to coordinate.

## 2024-04-14 ENCOUNTER — Ambulatory Visit: Attending: Cardiovascular Disease

## 2024-04-14 DIAGNOSIS — Z7901 Long term (current) use of anticoagulants: Secondary | ICD-10-CM | POA: Diagnosis present

## 2024-04-14 DIAGNOSIS — I480 Paroxysmal atrial fibrillation: Secondary | ICD-10-CM | POA: Insufficient documentation

## 2024-04-14 LAB — POCT INR: INR: 2.3 (ref 2.0–3.0)

## 2024-04-14 NOTE — Patient Instructions (Signed)
 Take another 0.5 tablet today then continue 2 tablets daily, except 1.5 tablets every  Wednesday.  INR in 1 week.   No leafy greens. [phone] Watchman 8/28  weekly INRs

## 2024-04-19 ENCOUNTER — Telehealth: Payer: Self-pay | Admitting: Oncology

## 2024-04-19 NOTE — Telephone Encounter (Signed)
 Pt r/s lab/MD from 8/15 her no showed appt

## 2024-04-20 ENCOUNTER — Telehealth: Payer: Self-pay

## 2024-04-20 NOTE — Telephone Encounter (Signed)
 Pre-Watchman call attempted.  Left message to call back.

## 2024-04-21 ENCOUNTER — Telehealth: Payer: Self-pay

## 2024-04-21 ENCOUNTER — Ambulatory Visit: Attending: Cardiovascular Disease

## 2024-04-21 DIAGNOSIS — I480 Paroxysmal atrial fibrillation: Secondary | ICD-10-CM | POA: Insufficient documentation

## 2024-04-21 DIAGNOSIS — Z7901 Long term (current) use of anticoagulants: Secondary | ICD-10-CM | POA: Diagnosis present

## 2024-04-21 LAB — POCT INR: INR: 4.9 — AB (ref 2.0–3.0)

## 2024-04-21 NOTE — Patient Instructions (Addendum)
 Took Warfarin today, so Hold tomorrow and eat spinach tonight then continue 2 tablets daily, except 1.5 tablets every Wednesday.  INR in 2 weeks Watchman 8/28 [phone]

## 2024-04-21 NOTE — Telephone Encounter (Signed)
 Spoke with Dr. Cindie. Due to INR of 4.9, will reschedule the patient's procedure to 05/27/2024. Per Dr. Cindie, scheduled the patient for Coumadin  check 9/3 to transition to Eliquis . The patient was grateful for call and agreed with plan.

## 2024-04-28 ENCOUNTER — Ambulatory Visit: Attending: Cardiovascular Disease

## 2024-04-28 DIAGNOSIS — Z7901 Long term (current) use of anticoagulants: Secondary | ICD-10-CM | POA: Diagnosis present

## 2024-04-28 DIAGNOSIS — I480 Paroxysmal atrial fibrillation: Secondary | ICD-10-CM | POA: Diagnosis present

## 2024-04-28 LAB — POCT INR: INR: 2.7 (ref 2.0–3.0)

## 2024-04-28 MED ORDER — APIXABAN 2.5 MG PO TABS: 2.5000 mg | ORAL_TABLET | Freq: Two times a day (BID) | ORAL | 5 refills | Status: DC

## 2024-04-28 NOTE — Patient Instructions (Signed)
 Hold tomorrow then Start Eliquis  2.5 mg Friday evening.

## 2024-04-29 ENCOUNTER — Telehealth: Payer: Self-pay | Admitting: Pharmacist

## 2024-04-29 DIAGNOSIS — I480 Paroxysmal atrial fibrillation: Secondary | ICD-10-CM

## 2024-04-29 MED ORDER — APIXABAN 2.5 MG PO TABS: 2.5000 mg | ORAL_TABLET | Freq: Two times a day (BID) | ORAL | Status: DC

## 2024-04-29 MED ORDER — APIXABAN 2.5 MG PO TABS: 2.5000 mg | ORAL_TABLET | Freq: Two times a day (BID) | ORAL | 0 refills | Status: DC

## 2024-04-29 NOTE — Telephone Encounter (Signed)
 Patient needs Eliquis  for Watchman procedure due to labile INRs.  Samples dispensed.

## 2024-04-29 NOTE — Telephone Encounter (Signed)
 The patient will come [place], 9/8 to pick up samples.

## 2024-05-02 ENCOUNTER — Other Ambulatory Visit: Payer: Self-pay | Admitting: Internal Medicine

## 2024-05-03 ENCOUNTER — Other Ambulatory Visit: Payer: Self-pay

## 2024-05-03 ENCOUNTER — Other Ambulatory Visit: Payer: Self-pay | Admitting: Internal Medicine

## 2024-05-03 NOTE — Patient Outreach (Signed)
 Complex Care Management   Visit Note  05/03/2024  Name:  Dawn Sherman MRN: [id number] DOB: Sep 05, 1955  Situation: Referral received for Complex Care Management related to Heart Failure and COPD I obtained verbal consent from Patient.  Visit completed with Patient  on the phone  Background:   Past Medical History:  Diagnosis Date   B12 deficiency    Cardiomyopathy (HCC)    a. 09/2022 Echo: EF 35-40%; b. 09/2022 MV: apical defect w/ mild peri-infarct ischemia. EF 43%.   Chronic HFrEF (heart failure with reduced ejection fraction) (HCC)    a. 01/2020 Echo: EF 30-35%; b. 07/2020 Echo: EF 50-55%; c. 06/2022 Echo: EF 50-55%; d. 09/2022 Echo: EF 35-40%, globh HK, mod reduced RV fxn, sev BAE, mod MR.   CKD (chronic kidney disease), stage IV (HCC)    COPD (chronic obstructive pulmonary disease) (HCC)    Depression    Endometriosis    Hypertension    Mixed hyperlipidemia    Moderate mitral regurgitation    Normocytic anemia    PAF (paroxysmal atrial fibrillation) (HCC)    a. CHA2DS2VASc = 4-->eliquis  2.5 bid.   Paroxymsal Atrial flutter (HCC)    Tobacco abuse     Assessment: Patient Reported Symptoms:  Cognitive Cognitive Status: No symptoms reported Cognitive/Intellectual Conditions Management [RPT]: None reported or documented in medical history or problem list   Health Maintenance Behaviors: Annual physical exam, Exercise Healing Pattern: Average Health Facilitated by: Healthy diet, Rest  Neurological Neurological Review of Symptoms: No symptoms reported Neurological Management Strategies: Routine screening  HEENT HEENT Symptoms Reported: No symptoms reported HEENT Management Strategies: Routine screening, Medication therapy    Cardiovascular Cardiovascular Symptoms Reported: No symptoms reported Does patient have uncontrolled Hypertension?: No Cardiovascular Management Strategies: Medication therapy, Routine screening Weight: 115 lb (52.2 kg) Cardiovascular Self-Management  Outcome: 4 (good) Cardiovascular Comment: No HF or afib symptoms  Respiratory Other Respiratory Symptoms: O2 2L continuous PO2 95% Respiratory Management Strategies: Adequate rest, Oxygen therapy, Routine screening, Medication therapy  Endocrine Endocrine Symptoms Reported: Not assessed Is patient diabetic?: No    Gastrointestinal Gastrointestinal Symptoms Reported: No symptoms reported Gastrointestinal Self-Management Outcome: 4 (good)    Genitourinary Genitourinary Symptoms Reported: No symptoms reported    Integumentary Integumentary Symptoms Reported: No symptoms reported Skin Management Strategies: Routine screening  Musculoskeletal Musculoskelatal Symptoms Reviewed: No symptoms reported Musculoskeletal Management Strategies: Routine screening Falls in the past year?: No Number of falls in past year: 1 or less Was there an injury with Fall?: No Fall Risk Category Calculator: 0 Patient Fall Risk Level: Low Fall Risk Patient at Risk for Falls Due to: History of fall(s) Fall risk Follow up: Falls evaluation completed, Falls prevention discussed (Anticoagulent - ED for head bumps)  Psychosocial Psychosocial Symptoms Reported: No symptoms reported Behavioral Management Strategies: Medication therapy Major Change/Loss/Stressor/Fears (CP): Medical condition, self Techniques to Cope with Loss/Stress/Change: Diversional activities, Medication Quality of Family Relationships: supportive Do you feel physically threatened by others?: No    05/03/2024    PHQ2-9 Depression Screening   Little interest or pleasure in doing things Not at all  Feeling down, depressed, or hopeless Not at all  PHQ-2 - Total Score 0  Trouble falling or staying asleep, or sleeping too much    Feeling tired or having little energy    Poor appetite or overeating     Feeling bad about yourself - or that you are a failure or have let yourself or your family down    Trouble concentrating on things, such  as reading  the newspaper or watching television    Moving or speaking so slowly that other people could have noticed.  Or the opposite - being so fidgety or restless that you have been moving around a lot more than usual    Thoughts that you would be better off dead, or hurting yourself in some way    PHQ2-9 Total Score    If you checked off any problems, how difficult have these problems made it for you to do your work, take care of things at home, or get along with other people    Depression Interventions/Treatment      Vitals:   05/03/24 1409  BP: 138/75  SpO2: 95%    Medications Reviewed Today     Reviewed by Devra Lands, RN (Registered Nurse) on 05/03/24 at 1407  Med List Status: <None>   Medication Order Taking? Sig Documenting Provider Last Dose Status Informant  acetaminophen  (TYLENOL ) 325 MG tablet [id number] Yes Take 650 mg by mouth every 6 (six) hours as needed for mild pain (pain score 1-3) or moderate pain (pain score 4-6) (pain.). [provider]  Active Self           Med Note CHRISTIE, ALYSON   Tue Apr 20, 2024  4:35 PM)    albuterol  (VENTOLIN  HFA) [phone] Base) MCG/ACT inhaler [id number] Yes Inhale 2 puffs into the lungs every 6 (six) hours as needed for wheezing or shortness of breath. Glendia Shad, MD  Active Self  apixaban  (ELIQUIS ) 2.5 MG TABS tablet [id number] Yes Take 1 tablet (2.5 mg total) by mouth 2 (two) times daily. Wonda Sharper, MD  Active   apixaban  (ELIQUIS ) 2.5 MG TABS tablet [id number]  Take 1 tablet (2.5 mg total) by mouth 2 (two) times daily. Wonda Sharper, MD  Active   atorvastatin  (LIPITOR) 40 MG tablet [id number] Yes TAKE ONE TABLET BY MOUTH EVERY DAY Glendia Shad, MD  Active Self  buPROPion  (WELLBUTRIN  XL) 150 MG 24 hr tablet [id number] Yes Take 1 tablet (150 mg total) by mouth daily. Glendia Shad, MD  Active Self  calcitRIOL  (ROCALTROL ) 0.25 MCG capsule [id number] Yes Take 0.25 mcg by mouth daily. [provider]  Active Self   calcium  carbonate (OS-CAL - DOSED IN MG OF ELEMENTAL CALCIUM ) 1250 (500 Ca) MG tablet [id number] Yes Take 1 tablet (500 mg of elemental calcium  total) by mouth 3 (three) times daily with meals. Patel, Sona, MD  Active Self  carvedilol  (COREG ) 3.125 MG tablet [id number] Yes Take 1 tablet (3.125 mg total) by mouth 2 (two) times daily. Riddle, Suzann, NP  Active Self  FARXIGA  10 MG TABS tablet [id number] Yes Take 1 tablet (10 mg total) by mouth daily. Glendia Shad, MD  Active Self           Med Note CHRISTIE ALYSON   Tue Apr 20, 2024  4:36 PM) On hold  hydrALAZINE  (APRESOLINE ) 25 MG tablet [id number] Yes Take 1 tablet (25 mg total) by mouth 3 (three) times daily. Rolan Ezra RAMAN, MD  Active Self  isosorbide  mononitrate (IMDUR ) 30 MG 24 hr tablet [id number] Yes Take 1 tablet (30 mg total) by mouth daily. Rolan Ezra RAMAN, MD  Active Self  levothyroxine  (SYNTHROID ) 75 MCG tablet [id number] Yes Take 1 tablet (75 mcg total) by mouth daily before breakfast. Glendia Shad, MD  Active Self  loratadine  (CLARITIN ) 10 MG tablet [id number] Yes Take 10 mg by mouth daily. [provider]  Active Self  omeprazole  (PRILOSEC) 20 MG capsule [id number]  Yes Take 1 capsule (20 mg total) by mouth daily. Riddle, Suzann, NP  Active Self  torsemide  (DEMADEX ) 20 MG tablet [id number] Yes Take 20-60 mg by mouth See admin instructions. Tale 60 mg in the morning and 20 mg in the afternoon if notice swelling [provider]  Active Self  venlafaxine  XR (EFFEXOR -XR) 150 MG 24 hr capsule [id number] Yes TAKE ONE CAPSULE BY MOUTH EVERY DAY Glendia Shad, MD  Active             Recommendation:   PCP Follow-up Continue Current Plan of Care  Follow Up Plan:   Telephone follow-up in 1 month  Nestora Duos, MSN, RN [facility]  [facility], [facility] RN Care Manager Direct Dial: [phone] Fax: [phone]

## 2024-05-03 NOTE — Telephone Encounter (Signed)
 Copied from CRM [id number]. Topic: Clinical - Medication Refill >> Apr 29, 2024  1:04 PM Anairis L wrote: Medication: hydrALAZINE  (APRESOLINE ) 25 MG tablet isosorbide  mononitrate (IMDUR ) 30 MG 24 hr tablet  Has the patient contacted their pharmacy? Yes (Agent: If no, request that the patient contact the pharmacy for the refill. If patient does not wish to contact the pharmacy document the reason why and proceed with request.) (Agent: If yes, when and what did the pharmacy advise?)  This is the patient's preferred pharmacy:  [address] [facility] [phone] [facility], [place] [address] [place] [address] Phone: [phone] Fax: [phone]    Is this the correct pharmacy for this prescription? Yes If no, delete pharmacy and type the correct one.   Has the prescription been filled recently? No  Is the patient out of the medication? No  Has the patient been seen for an appointment in the last year OR does the patient have an upcoming appointment? Yes  Can we respond through MyChart? Yes  Agent: Please be advised that Rx refills may take up to 3 business days. We ask that you follow-up with your pharmacy. >> May 03, 2024  1:09 PM Carlen Rebuck wrote: Patient calling to check on the status of medication refill. Another medication(venlafaxine  XR (EFFEXOR -XR)) was sent over to the pharmacy today. Patient would prefer to pick up all of her medications at one time and would like the other two medications(hydrALAZINE  (APRESOLINE ) and isosorbide  mononitrate) called in as well.

## 2024-05-03 NOTE — Patient Instructions (Signed)
 Visit Information  Thank you for taking time to visit with me today. Please don't hesitate to contact me if I can be of assistance to you before our next scheduled appointment.  Your next care management appointment is by telephone on 05/31/2024 at 2:00 pm  Telephone follow-up in 1 month  Please call the care guide team at [phone] if you need to cancel, schedule, or reschedule an appointment.   Please call the Suicide and Crisis Lifeline: 988 call the [place]  National Suicide Prevention Lifeline: (405)274-3546 or TTY: 563-334-7592 TTY 608-701-7279) to talk to a trained counselor call 1-800-273-TALK (toll free, 24 hour hotline) go to [facility] [address] [place] [phone]) call 911 if you are experiencing a Mental Health or Behavioral Health Crisis or need someone to talk to.  Nestora Duos, MSN, RN [facility], [facility] RN Care Manager Direct Dial: [phone] Fax: [phone]

## 2024-05-03 NOTE — Telephone Encounter (Signed)
 I don't mind refilling if needed, but it appears that cardiology has been refilling and seeing her regularly. If they are adjusting medication, it may be better for her to have cardiology refill. Let me know if any problems.

## 2024-05-03 NOTE — Telephone Encounter (Signed)
 Both medications were previously filled by a different provider. Is it okay to refill them?   Last OV: 02/19/2024 Next OV: 05/25/2024

## 2024-05-04 ENCOUNTER — Other Ambulatory Visit: Payer: Self-pay

## 2024-05-04 DIAGNOSIS — D631 Anemia in chronic kidney disease: Secondary | ICD-10-CM

## 2024-05-04 NOTE — Telephone Encounter (Signed)
 LM with husband to have patient call when she gets home.

## 2024-05-04 NOTE — Telephone Encounter (Signed)
 Copied from CRM [id number]. Topic: General - Call Back - No Documentation >> May 04, 2024 12:07 PM Chiquita SQUIBB wrote: Reason for CRM: Patient is calling Trisha back, please contact the patient.

## 2024-05-05 ENCOUNTER — Encounter

## 2024-05-06 ENCOUNTER — Telehealth: Payer: Self-pay | Admitting: Cardiology

## 2024-05-06 MED ORDER — HYDRALAZINE HCL 25 MG PO TABS: 25.0000 mg | ORAL_TABLET | Freq: Three times a day (TID) | ORAL | 5 refills | Status: AC

## 2024-05-06 MED ORDER — ISOSORBIDE MONONITRATE ER 30 MG PO TB24: 30.0000 mg | ORAL_TABLET | Freq: Every day | ORAL | 3 refills | Status: AC

## 2024-05-06 NOTE — Telephone Encounter (Signed)
 Meds sent to pharmacy.

## 2024-05-10 ENCOUNTER — Inpatient Hospital Stay: Attending: Oncology

## 2024-05-10 ENCOUNTER — Encounter: Payer: Self-pay | Admitting: Oncology

## 2024-05-10 ENCOUNTER — Inpatient Hospital Stay (HOSPITAL_BASED_OUTPATIENT_CLINIC_OR_DEPARTMENT_OTHER): Admitting: Oncology

## 2024-05-10 VITALS — BP 123/48 | HR 102 | Temp 97.3°F | Resp 18 | Ht 65.0 in | Wt 113.5 lb

## 2024-05-10 DIAGNOSIS — D631 Anemia in chronic kidney disease: Secondary | ICD-10-CM

## 2024-05-10 DIAGNOSIS — D509 Iron deficiency anemia, unspecified: Secondary | ICD-10-CM

## 2024-05-10 DIAGNOSIS — E538 Deficiency of other specified B group vitamins: Secondary | ICD-10-CM

## 2024-05-10 LAB — CBC WITH DIFFERENTIAL (CANCER CENTER ONLY)
Abs Immature Granulocytes: 0 K/uL (ref 0.00–0.07)
Band Neutrophils: 0 %
Basophils Absolute: 0.1 K/uL (ref 0.0–0.1)
Basophils Relative: 1 %
Blasts: 0 %
Eosinophils Absolute: 0.1 K/uL (ref 0.0–0.5)
Eosinophils Relative: 1 %
HCT: 44.3 % (ref 36.0–46.0)
Hemoglobin: 14.4 g/dL (ref 12.0–15.0)
Immature Granulocytes: 0 %
Lymphocytes Relative: 10 %
Lymphs Abs: 0.9 K/uL (ref 0.7–4.0)
MCH: 31 pg (ref 26.0–34.0)
MCHC: 32.5 g/dL (ref 30.0–36.0)
MCV: 95.3 fL (ref 80.0–100.0)
Metamyelocytes Relative: 0 %
Monocytes Absolute: 0.7 K/uL (ref 0.1–1.0)
Monocytes Relative: 8 %
Myelocytes: 0 %
Neutro Abs: 7 K/uL (ref 1.7–7.7)
Neutrophils Relative %: 80 %
Other: 0 %
Platelet Count: 148 K/uL — ABNORMAL LOW (ref 150–400)
Promyelocytes Relative: 0 %
RBC: 4.65 MIL/uL (ref 3.87–5.11)
RDW: 14.6 % (ref 11.5–15.5)
WBC Count: 8.8 K/uL (ref 4.0–10.5)
nRBC: 0 % (ref 0.0–0.2)
nRBC: 0 /100{WBCs}

## 2024-05-10 LAB — BASIC METABOLIC PANEL - CANCER CENTER ONLY
Anion gap: 12 (ref 5–15)
BUN: 54 mg/dL — ABNORMAL HIGH (ref 8–23)
CO2: 18 mmol/L — ABNORMAL LOW (ref 22–32)
Calcium: 8.8 mg/dL — ABNORMAL LOW (ref 8.9–10.3)
Chloride: 104 mmol/L (ref 98–111)
Creatinine: 3.27 mg/dL — ABNORMAL HIGH (ref 0.44–1.00)
GFR, Estimated: 15 mL/min — ABNORMAL LOW (ref >60)
Glucose, Bld: 143 mg/dL — ABNORMAL HIGH (ref 70–99)
Potassium: 3.8 mmol/L (ref 3.5–5.1)
Sodium: 134 mmol/L — ABNORMAL LOW (ref 135–145)

## 2024-05-10 LAB — TECHNOLOGIST SMEAR REVIEW
Plt Morphology: NORMAL
RBC MORPHOLOGY: NORMAL
WBC MORPHOLOGY: NORMAL

## 2024-05-10 LAB — VITAMIN B12: Vitamin B-12: 366 pg/mL (ref 180–914)

## 2024-05-10 LAB — FERRITIN: Ferritin: 114 ng/mL (ref 11–307)

## 2024-05-10 LAB — FOLATE: Folate: 14.7 ng/mL (ref >5.9)

## 2024-05-10 LAB — IRON AND TIBC
Iron: 86 ug/dL (ref 28–170)
Saturation Ratios: 34 % — ABNORMAL HIGH (ref 10.4–31.8)
TIBC: 252 ug/dL (ref 250–450)
UIBC: 166 ug/dL

## 2024-05-10 LAB — LACTATE DEHYDROGENASE: LDH: 125 U/L (ref 98–192)

## 2024-05-10 LAB — IMMATURE PLATELET FRACTION: Immature Platelet Fraction: 7.7 % (ref 1.2–8.6)

## 2024-05-10 NOTE — Progress Notes (Signed)
 Patient states she's doing okay. She has been experiencing some nausea & vomiting, but thinks it may be food poising.

## 2024-05-10 NOTE — Progress Notes (Signed)
 Hematology/Oncology Consult note [facility]  Telephone:([phone] [phone]  Patient Care Team: Glendia Shad, MD as PCP - General (Internal Medicine) Perla Evalene PARAS, MD as PCP - Cardiology (Cardiology) Cindie Ole DASEN, MD as PCP - Electrophysiology (Cardiology) Dessa Reyes ORN, MD as Consulting Physician (General Surgery) Melanee Annah BROCKS, MD as Consulting Physician (Oncology) Devra Lands, RN as [facility] Care Management   Name of the patient: Dawn Sherman  [id number]  1956/06/16   Date of visit: 05/10/24  Diagnosis-iron deficiency anemia  Chief complaint/ Reason for visit-routine follow-up of iron deficiency anemia  Heme/Onc history: Patient is a 68 year old female with a past medical history significant for hypertension, A-fib presently on Coumadin  , CKD who was admitted to the ER after she had a fall.  Patient experienced some generalized weakness and dizziness prior to the fall and felt like she blacked out and fell to the floor hitting the left side of her head and face.  She had a CT head without contrast which showed acute bilateral subarachnoid hemorrhages predominantly along the left sylvian fissure and multiple small foci in the left frontal lobe.  Linear focus of subarachnoid hemorrhage in the right temporal lobe and right frontal lobe.  No significant edema or midline shift.  Subsequent MRI angio head without contrast showed normal intracranial MRA.  Patient was seen by neurosurgery and no surgical intervention is recommended.Platelet counts dropped down to 28 in April 2023 during that hospitalization. Smear review and LDH not indicated of TTP.  No evidence of DIC.  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  HIV hepatitis B hepatitis C and stool H. 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   Patient has history of heart failure with an EF of 25 to 30% with global hypokinesis.  She follows up with cardiology for the same.  She also has CKD stage IV.  She is on low-dose Eliquis  presently for her paroxysmal A-fib.   Patient last seen by me in May 2023 and now referred for anemia.CBC from April 2024 showed H&H of 9.1/28.5 with an MCV of 85.  Ferritin levels were normal at 112 with an iron saturation of 11%.  B12 levels were normal at 520.  TSH elevated during hospitalization at 165.  BMP shows elevated creatinine of 2.8.  Patient received IV iron in April 2024.    Interval history-patient is feeling well presently.  Denies any blood loss in her stool or urine.  Denies any dark melanotic stools  ECOG PS- 1 Pain scale- 0   Review of systems- Review of Systems  Constitutional:  Negative for chills, fever, malaise/fatigue and weight loss.  HENT:  Negative for congestion, ear discharge and nosebleeds.   Eyes:  Negative for blurred vision.  Respiratory:  Negative for cough, hemoptysis, sputum production, shortness of breath and wheezing.   Cardiovascular:  Negative for chest pain, palpitations, orthopnea and claudication.  Gastrointestinal:  Negative for abdominal pain, blood in stool, constipation, diarrhea, heartburn, melena, nausea and vomiting.  Genitourinary:  Negative for dysuria, flank pain, frequency, hematuria and urgency.  Musculoskeletal:  Negative for back pain, joint pain and myalgias.  Skin:  Negative for rash.  Neurological:  Negative for dizziness, tingling, focal weakness, seizures, weakness and headaches.  Endo/Heme/Allergies:  Does not bruise/bleed easily.  Psychiatric/Behavioral:  Negative for depression and suicidal ideas. The  patient does not have insomnia.       Allergies  Allergen Reactions   Sulfate Rash   Codeine Sulfate Nausea Only   Benicar [Olmesartan]     Talked with patient February 10, 2020, intolerance is unclear, tried several  medications around that time and one of them gave her a rash but she is not clear which 1.   Amoxicillin Rash   Clindamycin/Lincomycin Rash   Entresto  [Sacubitril -Valsartan ] Other (See Comments)    hyperkalemia   Morphine And Codeine Rash   Penicillins Rash     Past Medical History:  Diagnosis Date   B12 deficiency    Cardiomyopathy (HCC)    a. 09/2022 Echo: EF 35-40%; b. 09/2022 MV: apical defect w/ mild peri-infarct ischemia. EF 43%.   Chronic HFrEF (heart failure with reduced ejection fraction) (HCC)    a. 01/2020 Echo: EF 30-35%; b. 07/2020 Echo: EF 50-55%; c. 06/2022 Echo: EF 50-55%; d. 09/2022 Echo: EF 35-40%, globh HK, mod reduced RV fxn, sev BAE, mod MR.   CKD (chronic kidney disease), stage IV (HCC)    COPD (chronic obstructive pulmonary disease) (HCC)    Depression    Endometriosis    Hypertension    Mixed hyperlipidemia    Moderate mitral regurgitation    Normocytic anemia    PAF (paroxysmal atrial fibrillation) (HCC)    a. CHA2DS2VASc = 4-->eliquis  2.5 bid.   Paroxymsal Atrial flutter (HCC)    Tobacco abuse      Past Surgical History:  Procedure Laterality Date   ABDOMINAL HYSTERECTOMY     ATRIAL FIBRILLATION ABLATION N/A 08/04/2023   Procedure: ATRIAL FIBRILLATION ABLATION;  Surgeon: Cindie Ole DASEN, MD;  Location: [facility] INVASIVE CV LAB;  Service: Cardiovascular;  Laterality: N/A;   BREAST BIOPSY Right 2015   neg-  FIBROADENOMA   CENTRAL LINE INSERTION  11/15/2022   Procedure: CENTRAL LINE INSERTION;  Surgeon: Mady Bruckner, MD;  Location: [facility] INVASIVE CV LAB;  Service: Cardiovascular;;   COLONOSCOPY WITH PROPOFOL  N/A 11/24/2022   Procedure: COLONOSCOPY WITH PROPOFOL ;  Surgeon: Maryruth Ole DASEN, MD;  Location: [facility] ENDOSCOPY;  Service: Endoscopy;  Laterality: N/A;   COLONOSCOPY WITH PROPOFOL  N/A 11/23/2022   Procedure: COLONOSCOPY WITH PROPOFOL ;  Surgeon: Maryruth Ole DASEN, MD;  Location: [facility] ENDOSCOPY;  Service: Endoscopy;  Laterality: N/A;    ESOPHAGOGASTRODUODENOSCOPY (EGD) WITH PROPOFOL  N/A 11/23/2022   Procedure: ESOPHAGOGASTRODUODENOSCOPY (EGD) WITH PROPOFOL ;  Surgeon: Maryruth Ole DASEN, MD;  Location: [facility] ENDOSCOPY;  Service: Endoscopy;  Laterality: N/A;   RIGHT HEART CATH N/A 11/15/2022   Procedure: RIGHT HEART CATH;  Surgeon: Mady Bruckner, MD;  Location: [facility] INVASIVE CV LAB;  Service: Cardiovascular;  Laterality: N/A;   RIGHT/LEFT HEART CATH AND CORONARY ANGIOGRAPHY N/A 11/25/2022   Procedure: RIGHT/LEFT HEART CATH AND CORONARY ANGIOGRAPHY;  Surgeon: Darron Deatrice LABOR, MD;  Location: [facility] INVASIVE CV LAB;  Service: Cardiovascular;  Laterality: N/A;   TAH/RSO  1999   secondary to bleeding and endometriosis (Dr Trula)   TRANSESOPHAGEAL ECHOCARDIOGRAM (CATH LAB) N/A 08/04/2023   Procedure: TRANSESOPHAGEAL ECHOCARDIOGRAM;  Surgeon: Cindie Ole DASEN, MD;  [facility] INVASIVE CV LAB;  Service: Cardiovascular;  Laterality: N/A;    Social History   Socioeconomic History   Marital status: Married    Spouse name: Not on file   Number of children: Not on file   Years of education: Not on file   Highest education level: 12th grade  Occupational History   Not on file  Tobacco Use   Smoking status: Former  Current packs/day: 0.00    Average packs/day: 0.5 packs/day for 49.0 years (24.5 ttl pk-yrs)    Types: Cigarettes    Start date: 27    Quit date: 2024    Years since quitting: 1.7   Smokeless tobacco: Never   Tobacco comments:    Patient quit smoking recently \\Quit  6 months ago  Vaping Use   Vaping status: Never Used  Substance and Sexual Activity   Alcohol use: Not Currently    Alcohol/week: 0.0 standard drinks of alcohol    Comment: occasional   Drug use: No   Sexual activity: Not Currently  Other Topics Concern   Not on file  Social History Narrative   Lives in [place] with her husband.  She is retired from [employer]	[place].  She does not routinely exercise.   Social Drivers of Manufacturing engineer Strain: Low Risk  (12/15/2023)   Overall Financial Resource Strain (CARDIA)    Difficulty of Paying Living Expenses: Not hard at all  Food Insecurity: No Food Insecurity (05/03/2024)   Hunger Vital Sign    Worried About Running Out of Food in the Last Year: Never true    Ran Out of Food in the Last Year: Never true  Transportation Needs: No Transportation Needs (05/03/2024)   PRAPARE - Administrator, Civil Service (Medical): No    Lack of Transportation (Non-Medical): No  Physical Activity: Inactive (12/15/2023)   Exercise Vital Sign    Days of Exercise per Week: 0 days    Minutes of Exercise per Session: 0 min  Stress: No Stress Concern Present (12/15/2023)   [employer] of Occupational Health - Occupational Stress Questionnaire    Feeling of Stress : Only a little  Social Connections: Socially Integrated (02/24/2024)   Social Connection and Isolation Panel    Frequency of Communication with Friends and Family: Three times a week    Frequency of Social Gatherings with Friends and Family: Three times a week    Attends Religious Services: 1 to 4 times per year    Active Member of Clubs or Organizations: Yes    Attends Banker Meetings: 1 to 4 times per year    Marital Status: Married  Catering manager Violence: Not At Risk (05/03/2024)   Humiliation, Afraid, Rape, and Kick questionnaire    Fear of Current or Ex-Partner: No    Emotionally Abused: No    Physically Abused: No    Sexually Abused: No    Family History  Problem Relation Age of Onset   Hypercholesterolemia Mother    Rheum arthritis Father    Rheum arthritis Daughter    Fibromyalgia Daughter    Breast cancer Neg Hx    Colon cancer Neg Hx      Current Outpatient Medications:    acetaminophen  (TYLENOL ) 325 MG tablet, Take 650 mg by mouth every 6 (six) hours as needed for mild pain (pain score 1-3) or moderate pain (pain score 4-6) (pain.)., Disp: , Rfl:    albuterol   (VENTOLIN  HFA) 108 (90 Base) MCG/ACT inhaler, Inhale 2 puffs into the lungs every 6 (six) hours as needed for wheezing or shortness of breath., Disp: 18 g, Rfl: 0   apixaban  (ELIQUIS ) 2.5 MG TABS tablet, Take 1 tablet (2.5 mg total) by mouth 2 (two) times daily., Disp: 56 tablet, Rfl:    apixaban  (ELIQUIS ) 2.5 MG TABS tablet, Take 1 tablet (2.5 mg total) by mouth 2 (two) times daily., Disp: 56  tablet, Rfl: 0   atorvastatin  (LIPITOR) 40 MG tablet, TAKE ONE TABLET BY MOUTH EVERY DAY, Disp: 90 tablet, Rfl: 1   buPROPion  (WELLBUTRIN  XL) 150 MG 24 hr tablet, Take 1 tablet (150 mg total) by mouth daily., Disp: 90 tablet, Rfl: 1   calcitRIOL  (ROCALTROL ) 0.25 MCG capsule, Take 0.25 mcg by mouth daily., Disp: , Rfl:    calcium  carbonate (OS-CAL - DOSED IN MG OF ELEMENTAL CALCIUM ) 1250 (500 Ca) MG tablet, Take 1 tablet (500 mg of elemental calcium  total) by mouth 3 (three) times daily with meals., Disp: 60 tablet, Rfl: 1   carvedilol  (COREG ) 3.125 MG tablet, Take 1 tablet (3.125 mg total) by mouth 2 (two) times daily., Disp: 180 tablet, Rfl: 3   FARXIGA  10 MG TABS tablet, Take 1 tablet (10 mg total) by mouth daily., Disp: 90 tablet, Rfl: 3   hydrALAZINE  (APRESOLINE ) 25 MG tablet, Take 1 tablet (25 mg total) by mouth 3 (three) times daily., Disp: 90 tablet, Rfl: 5   isosorbide  mononitrate (IMDUR ) 30 MG 24 hr tablet, Take 1 tablet (30 mg total) by mouth daily., Disp: 90 tablet, Rfl: 3   levothyroxine  (SYNTHROID ) 75 MCG tablet, Take 1 tablet (75 mcg total) by mouth daily before breakfast., Disp: 30 tablet, Rfl: 1   loratadine  (CLARITIN ) 10 MG tablet, Take 10 mg by mouth daily., Disp: , Rfl:    omeprazole  (PRILOSEC) 20 MG capsule, Take 1 capsule (20 mg total) by mouth daily., Disp: , Rfl:    torsemide  (DEMADEX ) 20 MG tablet, Take 20-60 mg by mouth See admin instructions. Tale 60 mg in the morning and 20 mg in the afternoon if notice swelling, Disp: , Rfl:    venlafaxine  XR (EFFEXOR -XR) 150 MG 24 hr capsule, TAKE  ONE CAPSULE BY MOUTH EVERY DAY, Disp: 90 capsule, Rfl: 1  Physical exam:  Vitals:   05/10/24 0936  BP: (!) 123/48  Pulse: (!) 102  Resp: 18  Temp: (!) 97.3 F (36.3 C)  TempSrc: Tympanic  SpO2: 98%  Weight: 113 lb 8 oz (51.5 kg)  Height: 5' 5 (1.651 m)   Physical Exam Cardiovascular:     Rate and Rhythm: Normal rate and regular rhythm.     Heart sounds: Normal heart sounds.  Pulmonary:     Effort: Pulmonary effort is normal.     Breath sounds: Normal breath sounds.  Skin:    General: Skin is warm and dry.  Neurological:     Mental Status: She is alert and oriented to person, place, and time.      I have personally reviewed labs listed below:    Latest Ref Rng & Units 05/10/2024    9:21 AM  CMP  Glucose 70 - 99 mg/dL 856   BUN 8 - 23 mg/dL 54   Creatinine 9.55 - 1.00 mg/dL 6.72   Sodium 864 - 854 mmol/L 134   Potassium 3.5 - 5.1 mmol/L 3.8   Chloride 98 - 111 mmol/L 104   CO2 22 - 32 mmol/L 18   Calcium  8.9 - 10.3 mg/dL 8.8       Latest Ref Rng & Units 05/10/2024    9:22 AM  CBC  WBC 4.0 - 10.5 K/uL 8.8   Hemoglobin 12.0 - 15.0 g/dL 85.5   Hematocrit 63.9 - 46.0 % 44.3   Platelets 150 - 400 K/uL 148      Assessment and plan- Patient is a 68 y.o. female here for routine follow-up of normocytic anemia,  Patient presently H&H  of 14.4/44.3. Ferritin levels are normal at 114 and other iron studies are normal as well.  B12 levels are pending and folate is normal.  She does not require any IV iron at this time.  CBC ferritin and iron studies in 6 months in 1 year and I will see her back in 1 year    Visit Diagnosis 1. Iron deficiency anemia, unspecified iron deficiency anemia type      Dr. Annah Skene, MD, MPH [facility] [phone] 05/10/2024 12:01 PM

## 2024-05-11 ENCOUNTER — Telehealth: Payer: Self-pay | Admitting: Internal Medicine

## 2024-05-11 NOTE — Telephone Encounter (Signed)
 Called to let patient know she should have a refill at the pharmacy for her wellbutrin  but the calcitriol  should be prescribed by her nephrologist. Pt was taking a nap when I called. Husband advised that she would call back when she wakes up

## 2024-05-11 NOTE — Telephone Encounter (Signed)
 Prescription Request  05/11/2024  LOV: 02/19/2024  What is the name of the medication or equipment?   calcitRIOL  (ROCALTROL ) 0.25 MCG capsule   buPROPion  (WELLBUTRIN  XL) 150 MG 24 hr tablet  Have you contacted your pharmacy to request a refill? No   Which pharmacy would you like this sent to?  [facility] [id number] [facility] [place], [place] - [address] [place] [address] Phone: [phone] Fax: [phone]  [facility] [address] [place] [address] Phone: [phone] Fax: [phone]  [facility] [place], [place] - [address] [place] [address] Phone: [phone] Fax: [phone]    Patient notified that their request is being sent to the clinical staff for review and that they should receive a response within 2 business days.   Please advise at [facility] [phone]

## 2024-05-12 ENCOUNTER — Ambulatory Visit: Payer: Self-pay | Admitting: Student in an Organized Health Care Education/Training Program

## 2024-05-12 NOTE — Progress Notes (Signed)
 Virtual Visit via Telephone Note   I connected with Dawn Sherman on 02/11/24 at  9:30 AM EDT by telephone and verified that I am speaking with the correct person using two identifiers.   Location: Patient: at home Provider: [address] [place], [place], [address]    I discussed the limitations, risks, security and privacy concerns of performing an evaluation and management service by telephone and the availability of in person appointments. I also discussed with the patient that there may be a patient responsible charge related to this service. The patient expressed understanding and agreed to proceed.

## 2024-05-13 NOTE — Addendum Note (Signed)
 Addended by: Dianne Bady A on: 05/13/2024 10:07 AM   Modules accepted: Orders

## 2024-05-14 NOTE — Telephone Encounter (Signed)
 Patients husband stated that she is at the pharmacy picking up prescriptions now. If anything further is needed. Patient will call back

## 2024-05-18 ENCOUNTER — Telehealth: Payer: Self-pay

## 2024-05-18 NOTE — Telephone Encounter (Signed)
 Confirmed procedure date of 05/27/2024. Confirmed arrival time of 0530 for procedure time at 0730. Reviewed pre-procedure instructions with patient. She understood to take Farxiga  Sunday. 9/28 then STOP. Reviewed other med instructions. Confirmed she has no contrast allergy. Confirmed she does not currently have a PPM or defibrillator. The patient understands to call if questions/concerns arise prior to procedure. She was grateful for call and agreed with plan.

## 2024-05-20 ENCOUNTER — Other Ambulatory Visit: Payer: Self-pay | Admitting: Internal Medicine

## 2024-05-25 ENCOUNTER — Ambulatory Visit: Admitting: Internal Medicine

## 2024-05-27 ENCOUNTER — Encounter (HOSPITAL_COMMUNITY): Payer: Self-pay | Admitting: Cardiology

## 2024-05-27 ENCOUNTER — Inpatient Hospital Stay (HOSPITAL_COMMUNITY)
Admission: RE | Admit: 2024-05-27 | Discharge: 2024-05-27 | DRG: 229 | Disposition: A | Discharge status: Home / Self Care | Attending: Cardiology | Admitting: Cardiology

## 2024-05-27 ENCOUNTER — Encounter (HOSPITAL_COMMUNITY)
Admission: RE | Disposition: A | Discharge status: Home / Self Care | Payer: Self-pay | Source: Home / Self Care | Attending: Cardiology

## 2024-05-27 ENCOUNTER — Telehealth: Payer: Self-pay

## 2024-05-27 ENCOUNTER — Inpatient Hospital Stay (HOSPITAL_COMMUNITY)

## 2024-05-27 ENCOUNTER — Inpatient Hospital Stay (HOSPITAL_COMMUNITY): Admitting: Certified Registered Nurse Anesthetist

## 2024-05-27 ENCOUNTER — Other Ambulatory Visit: Payer: Self-pay

## 2024-05-27 DIAGNOSIS — I480 Paroxysmal atrial fibrillation: Secondary | ICD-10-CM

## 2024-05-27 DIAGNOSIS — Z7901 Long term (current) use of anticoagulants: Secondary | ICD-10-CM | POA: Diagnosis not present

## 2024-05-27 DIAGNOSIS — E785 Hyperlipidemia, unspecified: Secondary | ICD-10-CM | POA: Diagnosis present

## 2024-05-27 DIAGNOSIS — I5022 Chronic systolic (congestive) heart failure: Secondary | ICD-10-CM | POA: Diagnosis present

## 2024-05-27 DIAGNOSIS — E1122 Type 2 diabetes mellitus with diabetic chronic kidney disease: Secondary | ICD-10-CM | POA: Diagnosis present

## 2024-05-27 DIAGNOSIS — Z888 Allergy status to other drugs, medicaments and biological substances status: Secondary | ICD-10-CM | POA: Diagnosis not present

## 2024-05-27 DIAGNOSIS — Z880 Allergy status to penicillin: Secondary | ICD-10-CM | POA: Diagnosis not present

## 2024-05-27 DIAGNOSIS — Z79890 Hormone replacement therapy (postmenopausal): Secondary | ICD-10-CM | POA: Diagnosis not present

## 2024-05-27 DIAGNOSIS — I4819 Other persistent atrial fibrillation: Principal | ICD-10-CM | POA: Diagnosis present

## 2024-05-27 DIAGNOSIS — Z885 Allergy status to narcotic agent status: Secondary | ICD-10-CM | POA: Diagnosis not present

## 2024-05-27 DIAGNOSIS — Z006 Encounter for examination for normal comparison and control in clinical research program: Secondary | ICD-10-CM

## 2024-05-27 DIAGNOSIS — Z8679 Personal history of other diseases of the circulatory system: Secondary | ICD-10-CM

## 2024-05-27 DIAGNOSIS — N184 Chronic kidney disease, stage 4 (severe): Secondary | ICD-10-CM | POA: Diagnosis present

## 2024-05-27 DIAGNOSIS — I429 Cardiomyopathy, unspecified: Secondary | ICD-10-CM | POA: Diagnosis present

## 2024-05-27 DIAGNOSIS — Z79899 Other long term (current) drug therapy: Secondary | ICD-10-CM

## 2024-05-27 DIAGNOSIS — D631 Anemia in chronic kidney disease: Secondary | ICD-10-CM | POA: Diagnosis present

## 2024-05-27 DIAGNOSIS — I4891 Unspecified atrial fibrillation: Secondary | ICD-10-CM | POA: Diagnosis present

## 2024-05-27 DIAGNOSIS — I130 Hypertensive heart and chronic kidney disease with heart failure and stage 1 through stage 4 chronic kidney disease, or unspecified chronic kidney disease: Secondary | ICD-10-CM | POA: Diagnosis present

## 2024-05-27 DIAGNOSIS — D649 Anemia, unspecified: Secondary | ICD-10-CM

## 2024-05-27 DIAGNOSIS — J449 Chronic obstructive pulmonary disease, unspecified: Secondary | ICD-10-CM | POA: Diagnosis present

## 2024-05-27 HISTORY — PX: TRANSESOPHAGEAL ECHOCARDIOGRAM (CATH LAB): EP1270

## 2024-05-27 HISTORY — PX: LEFT ATRIAL APPENDAGE OCCLUSION: EP1229

## 2024-05-27 LAB — SURGICAL PCR SCREEN
MRSA, PCR: NEGATIVE
Staphylococcus aureus: NEGATIVE

## 2024-05-27 LAB — ECHO TEE

## 2024-05-27 LAB — TYPE AND SCREEN
ABO/RH(D): O NEG
Antibody Screen: NEGATIVE

## 2024-05-27 MED ORDER — SUGAMMADEX SODIUM 200 MG/2ML IV SOLN
INTRAVENOUS | Status: DC | PRN
  Administered 2024-05-27: 200 mg via INTRAVENOUS

## 2024-05-27 MED ORDER — SODIUM CHLORIDE 0.9 % IV SOLN: INTRAVENOUS | Status: DC

## 2024-05-27 MED ORDER — VANCOMYCIN HCL IN DEXTROSE 1-5 GM/200ML-% IV SOLN
1000.0000 mg | INTRAVENOUS | Status: AC
  Administered 2024-05-27: 1000 mg via INTRAVENOUS
  Filled 2024-05-27: qty 200

## 2024-05-27 MED ORDER — EPHEDRINE SULFATE-NACL 50-0.9 MG/10ML-% IV SOSY
PREFILLED_SYRINGE | INTRAVENOUS | Status: DC | PRN
  Administered 2024-05-27: 10 mg via INTRAVENOUS

## 2024-05-27 MED ORDER — FENTANYL CITRATE (PF) 100 MCG/2ML IJ SOLN
INTRAMUSCULAR | Status: AC
  Filled 2024-05-27: qty 2

## 2024-05-27 MED ORDER — ONDANSETRON HCL 4 MG/2ML IJ SOLN
INTRAMUSCULAR | Status: DC | PRN
  Administered 2024-05-27: 4 mg via INTRAVENOUS

## 2024-05-27 MED ORDER — APIXABAN 2.5 MG PO TABS
2.5000 mg | ORAL_TABLET | Freq: Two times a day (BID) | ORAL | Status: DC
  Administered 2024-05-27: 2.5 mg via ORAL
  Filled 2024-05-27: qty 1

## 2024-05-27 MED ORDER — IODIXANOL 320 MG/ML IV SOLN
INTRAVENOUS | Status: DC | PRN
  Administered 2024-05-27: 10 mL

## 2024-05-27 MED ORDER — ROCURONIUM BROMIDE 10 MG/ML (PF) SYRINGE
PREFILLED_SYRINGE | INTRAVENOUS | Status: DC | PRN
  Administered 2024-05-27: 50 mg via INTRAVENOUS
  Administered 2024-05-27: 30 mg via INTRAVENOUS

## 2024-05-27 MED ORDER — ONDANSETRON HCL 4 MG/2ML IJ SOLN: 4.0000 mg | Freq: Four times a day (QID) | INTRAMUSCULAR | Status: DC | PRN

## 2024-05-27 MED ORDER — FENTANYL CITRATE (PF) 250 MCG/5ML IJ SOLN
INTRAMUSCULAR | Status: DC | PRN
  Administered 2024-05-27 (×2): 50 ug via INTRAVENOUS

## 2024-05-27 MED ORDER — PHENYLEPHRINE HCL (PRESSORS) 10 MG/ML IV SOLN
INTRAVENOUS | Status: DC | PRN
Start: 2024-05-27 — End: 2024-05-27
  Administered 2024-05-27: 80 ug via INTRAVENOUS
  Administered 2024-05-27: 160 ug via INTRAVENOUS

## 2024-05-27 MED ORDER — ACETAMINOPHEN 325 MG PO TABS: 650.0000 mg | ORAL_TABLET | ORAL | Status: DC | PRN

## 2024-05-27 MED ORDER — HEPARIN SODIUM (PORCINE) 1000 UNIT/ML IJ SOLN
INTRAMUSCULAR | Status: DC | PRN
  Administered 2024-05-27: 8000 [IU] via INTRAVENOUS

## 2024-05-27 MED ORDER — PROTAMINE SULFATE 10 MG/ML IV SOLN
INTRAVENOUS | Status: DC | PRN
Start: 2024-05-27 — End: 2024-05-27
  Administered 2024-05-27: 30 mg via INTRAVENOUS

## 2024-05-27 MED ORDER — CHLORHEXIDINE GLUCONATE 0.12 % MT SOLN
OROMUCOSAL | Status: AC
  Administered 2024-05-27: 15 mL
  Filled 2024-05-27: qty 15

## 2024-05-27 MED ORDER — DEXAMETHASONE SODIUM PHOSPHATE 10 MG/ML IJ SOLN
INTRAMUSCULAR | Status: DC | PRN
  Administered 2024-05-27: 10 mg via INTRAVENOUS

## 2024-05-27 MED ORDER — LIDOCAINE 2% (20 MG/ML) 5 ML SYRINGE
INTRAMUSCULAR | Status: DC | PRN
  Administered 2024-05-27: 100 mg via INTRAVENOUS

## 2024-05-27 MED ORDER — PROPOFOL 10 MG/ML IV BOLUS
INTRAVENOUS | Status: DC | PRN
  Administered 2024-05-27: 60 mg via INTRAVENOUS

## 2024-05-27 MED ORDER — PHENYLEPHRINE HCL-NACL 20-0.9 MG/250ML-% IV SOLN
INTRAVENOUS | Status: DC | PRN
  Administered 2024-05-27: 25 ug/min via INTRAVENOUS

## 2024-05-27 MED ORDER — LACTATED RINGERS IV SOLN: INTRAVENOUS | Status: DC

## 2024-05-27 MED ORDER — SODIUM CHLORIDE 0.9 % IV SOLN: 250.0000 mL | INTRAVENOUS | Status: DC | PRN

## 2024-05-27 NOTE — Progress Notes (Signed)
 Bedrest completed at 1300. Patient ambulated and tolerated well. Right groin site level 0; dressing c,d,I.

## 2024-05-27 NOTE — H&P (Signed)
 Electrophysiology Office Note:     Date:  05/27/2024    ID:  Dawn Sherman, DOB 03-15-1956, MRN [id number]   [facility] Cardiologist:  Evalene Lunger, MD  [facility] Electrophysiologist:  OLE ONEIDA HOLTS, MD    Referring MD: Rolan Ezra RAMAN, MD    Chief Complaint: AF   History of Present Illness:     Dawn Sherman is a 68 y.o. femalewho I am seeing today for an evaluation of AFat the request of Dr Rolan.   The patient was last seen by Dr Rolan on 04/15/2023.   The patient has a medical history that includes COPD, pAF, MR, HTN, HLD, CKD4, prior ICH in setting of fall (now back on OAC) and systolic heart failure. EF at diagnosis was 30-35%.   Admitted 08/2022 with decompensated HF. Admitted 10/2022 with anemia (Hgb 7.1). Echo showed E F25-30 with severe MR/TR.   03/2023 cardiac MRI showed EF 47% with mild MR.    Now maintaining sinus on amiodarone .   She is doing well today.  She reports being active.  Her breathing is stable.  No fluid retention.  She takes her medications without missed doses.  She has not missed any Eliquis .   Presents for Watchman implant.          Objective Their past medical, social and family history was reveiwed.     ROS:   Please see the history of present illness.    All other systems reviewed and are negative.   EKGs/Labs/Other Studies Reviewed:     The following studies were reviewed today:   04/02/2023 cMR EF47 No LGE Normal RV Mild MR            Physical Exam:     VS:  BP 156/83   Pulse 89   Ht 5' 5 (1.651 m)   Wt 123 lb (55.8 kg)   SpO2 97%   BMI 20.47 kg/m         Wt Readings from Last 3 Encounters:  06/09/23 123 lb (55.8 kg)  04/23/23 120 lb 6.4 oz (54.6 kg)  04/15/23 121 lb (54.9 kg)      GEN:  Well nourished, well developed in no acute distress CARDIAC: RRR, no murmurs, rubs, gallops RESPIRATORY:  Clear to auscultation without rales, wheezing or rhonchi      Assessment ASSESSMENT AND PLAN:      1. Chronic systolic heart failure (HCC)   2. Encounter for long-term (current) use of high-risk medication   3. 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      I have seen Dawn Sherman in the office today who is being considered for a Watchman left atrial appendage closure device. I believe they will benefit from this procedure given their history of atrial fibrillation, CHA2DS2-VASc score of 5 and unadjusted ischemic stroke rate of 7.2% per year. Unfortunately, the patient is not felt to be a long term anticoagulation candidate secondary to history of anemia. The patient's chart has been reviewed and I feel that they would be a candidate for short term oral anticoagulation after Watchman implant.    It is my belief that after undergoing a LAA closure procedure, Dawn Sherman will not need long term anticoagulation which eliminates anticoagulation side effects and major bleeding risk.    Procedural risks for the Watchman implant have been reviewed with the  patient including a 0.5% risk of stroke, <1% risk of perforation and <1% risk of device embolization. Other risks include bleeding, vascular damage, tamponade, worsening renal function, and death. The patient understands these risk and wishes to proceed.       The published clinical data on the safety and effectiveness of WATCHMAN include but are not limited to the following: - Holmes DR, Jess BEARD, Sick P et al. for the PROTECT AF Investigators. Percutaneous closure of the left atrial appendage versus warfarin therapy for prevention of stroke in patients with atrial fibrillation: a randomised non-inferiority trial. Lancet 2009; 374: 534-42. GLENWOOD Jess BEARD, Doshi SK, Jonita VEAR Satchel D et al. on behalf of the PROTECT AF Investigators. Percutaneous Left Atrial Appendage Closure for Stroke  Prophylaxis in Patients With Atrial Fibrillation 2.3-Year Follow-up of the PROTECT AF (Watchman Left Atrial Appendage System for Embolic Protection in Patients With Atrial Fibrillation) Trial. Circulation 2013; 127:720-729. - Alli O, Doshi S,  Kar S, Reddy VY, Sievert H et al. Quality of Life Assessment in the Randomized PROTECT AF (Percutaneous Closure of the Left Atrial Appendage Versus Warfarin Therapy for Prevention of Stroke in Patients With Atrial Fibrillation) Trial of Patients at Risk for Stroke With Nonvalvular Atrial Fibrillation. J Am Coll Cardiol 2013; 61:1790-8. GLENWOOD Satchel DR, Archer RAMAN, Price M, Whisenant B, Sievert H, Doshi S, Huber K, Reddy V. Prospective randomized evaluation of the Watchman left atrial appendage Device in patients with atrial fibrillation versus long-term warfarin therapy; the PREVAIL trial. Journal of the [employer] of Cardiology, Vol. 4, No. 1, 2014, 1-11. - Kar S, Doshi SK, Sadhu A, Horton R, Osorio J et al. Primary outcome evaluation of a next-generation left atrial appendage closure device: results from the PINNACLE FLX trial. Circulation 2021;143(18)1754-1762.      After today's visit with the patient which was dedicated solely for shared decision making visit regarding LAA closure device, the patient decided to proceed with the LAA appendage closure procedure scheduled to be done in the near future at [facility].     HAS-BLED score 5 Hypertension Yes  Abnormal renal and liver function (Dialysis, transplant, Cr >2.26 mg/dL /Cirrhosis or Bilirubin >2x Normal or AST/ALT/AP >3x Normal) Yes  Stroke Yes  Bleeding Yes  Labile INR (Unstable/high INR) No  Elderly (>65) Yes  Drugs or alcohol (>= 8 drinks/week, anti-plt or NSAID) No    CHA2DS2-VASc Score = 5  The patient's score is based upon: CHF History: 1 HTN History: 1 Diabetes History: 0 Stroke History: 0 Vascular Disease History: 1 Age Score: 1 Gender Score: 1     #Chronic systolic heart  failure NYHA III. Warm and dry on exam today. Management complicated by CKD4 hx. Rhythm control indicated as above. Continue current medical therpay.    Presents for [employer] implant. Procedure reviewed.   Signed, Ole DASEN. Cindie, MD, [facility], [facility] 05/27/2024 Electrophysiology Level [facility]

## 2024-05-27 NOTE — Transfer of Care (Signed)
 Immediate Anesthesia Transfer of Care Note  Patient: Dawn Sherman  Procedure(s) Performed: LEFT ATRIAL APPENDAGE OCCLUSION TRANSESOPHAGEAL ECHOCARDIOGRAM  Patient Location: Cath Lab  Anesthesia Type:General  Level of Consciousness: awake, alert , and oriented  Airway & Oxygen Therapy: Patient Spontanous Breathing  Post-op Assessment: Report given to RN and Post -op Vital signs reviewed and stable  Post vital signs: Reviewed and stable  Last Vitals:  Vitals Value Taken Time  BP 136/61 05/27/24 09:50  Temp 36.6 C 05/27/24 09:25  Pulse 88 05/27/24 09:52  Resp 19 05/27/24 09:52  SpO2 92 % 05/27/24 09:52  Vitals shown include unfiled device data.  Last Pain:  Vitals:   05/27/24 0925  TempSrc: Temporal  PainSc: 0-No pain         Complications: No notable events documented.

## 2024-05-27 NOTE — Telephone Encounter (Signed)
-----   Message from Ozell Barter Tillery sent at 05/27/2024  1:41 PM EDT ----- Regarding: Aborted Watchman - [facility] EP Referral  This pts watchman was aborted today due device instability.   Carly, Can we please refer to [facility] EP for Amulet consideration? Pt is aware of the plan

## 2024-05-27 NOTE — Discharge Summary (Signed)
 Electrophysiology Discharge Summary   Patient ID: Dawn Sherman,  MRN: [id number], DOB/AGE: 1955-11-28 69 y.o.  Admit date: 05/27/2024 Discharge date: 05/27/2024  Primary Care Physician: Glendia Shad, MD  Primary Cardiologist: Timothy Gollan, MD  Electrophysiologist: CAMERON T LAMBERT, MD     Primary Discharge Diagnosis:  Persistent Atrial Fibrillation Poor candidacy for long term anticoagulation due to h/o Anemia  Procedures This Admission:  Transeptal Puncture Intra-procedural TEE which showed no LAA thrombus Aborted implantation of a WATCHMAN left atrial appendage occlusive device due to device instability   Brief HPI: Dawn Sherman is a 68 y.o. female with a history of Persistent Atrial Fibrillation who was referred to Electrophysiology in the outpatient setting    Hospital Course:  The patient was admitted for Watchman Procedure. The case had to be aborted due to device instability. The patient was monitored in the post procedure setting and has done very well with no concerns. Given this, he/she is being considered for same day discharge later today. Groin site has been stable without evidence of hematoma or bleeding. Wound care and restrictions were reviewed with the patient.   The patient has been scheduled for post procedure follow up with EP Team in approximately 6 weeks. They will restart Eliquis  this evening and continue.    Will refer to [facility] EP for Amulet consideration. Pt aware.   Physical Exam: Vitals:   05/27/24 1130 05/27/24 1200 05/27/24 1230 05/27/24 1300  BP: (!) 151/70 (!) 142/72 (!) 150/76 (!) 169/85  Pulse: 97 99 100 (!) 104  Resp: (!) 21 (!) 23 (!) 22 (!) 24  Temp:      TempSrc:      SpO2: 92% 90% 93% 96%  Weight:      Height:        GEN: Well nourished, well developed in no acute distress NECK: No JVD; No carotid bruits CARDIAC: Regular rate and rhythm, no murmurs, rubs, gallops RESPIRATORY:  Clear to auscultation without rales,  wheezing or rhonchi  ABDOMEN: Soft, non-tender, non-distended EXTREMITIES:  No edema; No deformity. Groin site Stable     Discharge Medications:  Allergies as of 05/27/2024       Reactions   Sulfate Rash   Codeine Sulfate Nausea Only   Benicar [olmesartan]    Talked with patient February 10, 2020, intolerance is unclear, tried several medications around that time and one of them gave her a rash but she is not clear which 1.   Amoxicillin Rash   Clindamycin/lincomycin Rash   Entresto  [sacubitril -valsartan ] Other (See Comments)   hyperkalemia   Morphine And Codeine Rash   Penicillins Rash        Medication List     TAKE these medications    acetaminophen  325 MG tablet Commonly known as: TYLENOL  Take 650 mg by mouth every 6 (six) hours as needed for mild pain (pain score 1-3) or moderate pain (pain score 4-6) (pain.).   albuterol  108 (90 Base) MCG/ACT inhaler Commonly known as: VENTOLIN  HFA Inhale 2 puffs into the lungs every 6 (six) hours as needed for wheezing or shortness of breath.   apixaban  2.5 MG Tabs tablet Commonly known as: ELIQUIS  Take 1 tablet (2.5 mg total) by mouth 2 (two) times daily.   atorvastatin  40 MG tablet Commonly known as: LIPITOR TAKE ONE TABLET BY MOUTH EVERY DAY   buPROPion  150 MG 24 hr tablet Commonly known as: WELLBUTRIN  XL Take 1 tablet (150 mg total) by mouth daily.   calcitRIOL  0.25  MCG capsule Commonly known as: ROCALTROL  Take 0.25 mcg by mouth daily.   calcium  carbonate 1250 (500 Ca) MG tablet Commonly known as: OS-CAL - dosed in mg of elemental calcium  Take 1 tablet (500 mg of elemental calcium  total) by mouth 3 (three) times daily with meals.   carvedilol  3.125 MG tablet Commonly known as: COREG  Take 1 tablet (3.125 mg total) by mouth 2 (two) times daily.   Farxiga  10 MG Tabs tablet Generic drug: dapagliflozin  propanediol Take 1 tablet (10 mg total) by mouth daily.   hydrALAZINE  25 MG tablet Commonly known as:  APRESOLINE  Take 1 tablet (25 mg total) by mouth 3 (three) times daily.   isosorbide  mononitrate 30 MG 24 hr tablet Commonly known as: IMDUR  Take 1 tablet (30 mg total) by mouth daily.   levothyroxine  75 MCG tablet Commonly known as: SYNTHROID  TAKE ONE TABLET BY MOUTH DAILY BEFORE BREAKFAST   loratadine  10 MG tablet Commonly known as: CLARITIN  Take 10 mg by mouth daily.   omeprazole  20 MG capsule Commonly known as: PRILOSEC Take 1 capsule (20 mg total) by mouth daily.   torsemide  20 MG tablet Commonly known as: DEMADEX  Take 20-60 mg by mouth See admin instructions. Tale 60 mg in the morning and 20 mg in the afternoon if notice swelling   venlafaxine  XR 150 MG 24 hr capsule Commonly known as: EFFEXOR -XR TAKE ONE CAPSULE BY MOUTH EVERY DAY        Disposition:  Home with follow up as per AVS  Duration of Discharge Encounter:  APP Time: 24 minutes  Signed, Ozell Prentice Passey, PA-C  05/27/2024 1:40 PM

## 2024-05-27 NOTE — Progress Notes (Signed)
 Ambulated back to recovery room. Back in stretcher. Right groin level 0, dressing c,d,I.

## 2024-05-27 NOTE — Anesthesia Preprocedure Evaluation (Signed)
 Anesthesia Evaluation  Patient identified by MRN, date of birth, ID band Patient awake    Reviewed: Allergy & Precautions, NPO status , Patient's Chart, lab work & pertinent test results, reviewed documented beta blocker date and time   History of Anesthesia Complications Negative for: history of anesthetic complications  Airway Mallampati: II  TM Distance: >3 FB     Dental  (+) Missing   Pulmonary neg shortness of breath, pneumonia, COPD, former smoker   breath sounds clear to auscultation       Cardiovascular hypertension, +CHF  (-) CAD, (-) Past MI and (-) Cardiac Stents + dysrhythmias Atrial Fibrillation  Rhythm:Regular Rate:Normal  IMPRESSIONS     1. Left ventricular ejection fraction, by estimation, is 55 to 60%. The  left ventricle has normal function. The left ventricle has no regional  wall motion abnormalities. There is moderate left ventricular hypertrophy.  Left ventricular diastolic  parameters were normal.   2. Right ventricular systolic function is normal. The right ventricular  size is normal. Tricuspid regurgitation signal is inadequate for assessing  PA pressure.   3. Left atrial size was mildly dilated.   4. The mitral valve is normal in structure. Mild mitral valve  regurgitation. No evidence of mitral stenosis.   5. The aortic valve is normal in structure. Aortic valve regurgitation is  not visualized. No aortic stenosis is present.   6. The inferior vena cava is normal in size with greater than 50%  respiratory variability, suggesting right atrial pressure of 3 mmHg.     Neuro/Psych neg Seizures PSYCHIATRIC DISORDERS Anxiety Depression       GI/Hepatic ,GERD  ,,(+) neg Cirrhosis        Endo/Other  Hypothyroidism    Renal/GU CRFRenal disease     Musculoskeletal   Abdominal   Peds  Hematology  (+) Blood dyscrasia, anemia   Anesthesia Other Findings   Reproductive/Obstetrics                               Anesthesia Physical Anesthesia Plan  ASA: 2  Anesthesia Plan: General   Post-op Pain Management:    Induction: Intravenous  PONV Risk Score and Plan: 2 and Ondansetron  and Dexamethasone   Airway Management Planned: Oral ETT  Additional Equipment:   Intra-op Plan:   Post-operative Plan: Extubation in OR  Informed Consent: I have reviewed the patients History and Physical, chart, labs and discussed the procedure including the risks, benefits and alternatives for the proposed anesthesia with the patient or authorized representative who has indicated his/her understanding and acceptance.     Dental advisory given  Plan Discussed with: CRNA  Anesthesia Plan Comments:          Anesthesia Quick Evaluation

## 2024-05-27 NOTE — Progress Notes (Signed)
 Pt presented today for inpatient only procedure.  Procedure details per op note.  Pt planned for inpatient admission. Pt had prolonged time in recovery awaiting bed availability.  She completed her bed rest, ambulated, and received post op teaching and precautions in the recovery area.  Discussed with admin and billing who felt it was most appropriate for patient to be discharged from recovery area.   Pt to be discharged.  Ozell [address] [place], PA-C

## 2024-05-27 NOTE — Plan of Care (Signed)

## 2024-05-27 NOTE — Plan of Care (Signed)
  Problem: Education: Goal: Knowledge of cardiac device and self-care will improve 05/27/2024 1541 by Jeneal Chiquita HERO, RN Outcome: Completed/Met 05/27/2024 1540 by Jeneal Chiquita HERO, RN Outcome: Adequate for Discharge Goal: Ability to safely manage health related needs after discharge will improve 05/27/2024 1541 by Jeneal Chiquita HERO, RN Outcome: Completed/Met 05/27/2024 1540 by Jeneal Chiquita HERO, RN Outcome: Adequate for Discharge Goal: Individualized Educational Video(s) 05/27/2024 1541 by Jeneal Chiquita HERO, RN Outcome: Completed/Met 05/27/2024 1540 by Jeneal Chiquita HERO, RN Outcome: Adequate for Discharge   Problem: Cardiac: Goal: Ability to achieve and maintain adequate cardiopulmonary perfusion will improve 05/27/2024 1541 by Jeneal Chiquita HERO, RN Outcome: Completed/Met 05/27/2024 1540 by Jeneal Chiquita HERO, RN Outcome: Adequate for Discharge   Problem: Education: Goal: Knowledge of General Education information will improve Description: Including pain rating scale, medication(s)/side effects and non-pharmacologic comfort measures 05/27/2024 1541 by Jeneal Chiquita HERO, RN Outcome: Completed/Met 05/27/2024 1540 by Jeneal Chiquita HERO, RN Outcome: Adequate for Discharge   Problem: Health Behavior/Discharge Planning: Goal: Ability to manage health-related needs will improve 05/27/2024 1541 by Jeneal Chiquita HERO, RN Outcome: Completed/Met 05/27/2024 1540 by Jeneal Chiquita HERO, RN Outcome: Adequate for Discharge   Problem: Clinical Measurements: Goal: Ability to maintain clinical measurements within normal limits will improve 05/27/2024 1541 by Jeneal Chiquita HERO, RN Outcome: Completed/Met 05/27/2024 1540 by Jeneal Chiquita HERO, RN Outcome: Adequate for Discharge Goal: Will remain free from infection 05/27/2024 1541 by Jeneal Chiquita HERO, RN Outcome: Completed/Met 05/27/2024 1540 by Jeneal Chiquita HERO, RN Outcome: Adequate for Discharge Goal: Diagnostic test results will improve 05/27/2024  1541 by Jeneal Chiquita HERO, RN Outcome: Completed/Met 05/27/2024 1540 by Jeneal Chiquita HERO, RN Outcome: Adequate for Discharge Goal: Respiratory complications will improve 05/27/2024 1541 by Jeneal Chiquita HERO, RN Outcome: Completed/Met 05/27/2024 1540 by Jeneal Chiquita HERO, RN Outcome: Adequate for Discharge Goal: Cardiovascular complication will be avoided 05/27/2024 1541 by Jeneal Chiquita HERO, RN Outcome: Completed/Met 05/27/2024 1540 by Jeneal Chiquita HERO, RN Outcome: Adequate for Discharge   Problem: Activity: Goal: Risk for activity intolerance will decrease 05/27/2024 1541 by Jeneal Chiquita HERO, RN Outcome: Completed/Met 05/27/2024 1540 by Jeneal Chiquita HERO, RN Outcome: Adequate for Discharge   Problem: Nutrition: Goal: Adequate nutrition will be maintained 05/27/2024 1541 by Jeneal Chiquita HERO, RN Outcome: Completed/Met 05/27/2024 1540 by Jeneal Chiquita HERO, RN Outcome: Adequate for Discharge   Problem: Coping: Goal: Level of anxiety will decrease 05/27/2024 1541 by Jeneal Chiquita HERO, RN Outcome: Completed/Met 05/27/2024 1540 by Jeneal Chiquita HERO, RN Outcome: Adequate for Discharge   Problem: Elimination: Goal: Will not experience complications related to bowel motility 05/27/2024 1541 by Jeneal Chiquita HERO, RN Outcome: Completed/Met 05/27/2024 1540 by Jeneal Chiquita HERO, RN Outcome: Adequate for Discharge Goal: Will not experience complications related to urinary retention 05/27/2024 1541 by Jeneal Chiquita HERO, RN Outcome: Completed/Met 05/27/2024 1540 by Jeneal Chiquita HERO, RN Outcome: Adequate for Discharge   Problem: Pain Managment: Goal: General experience of comfort will improve and/or be controlled 05/27/2024 1541 by Jeneal Chiquita HERO, RN Outcome: Completed/Met 05/27/2024 1540 by Jeneal Chiquita HERO, RN Outcome: Adequate for Discharge   Problem: Safety: Goal: Ability to remain free from injury will improve 05/27/2024 1541 by Jeneal Chiquita HERO, RN Outcome:  Completed/Met 05/27/2024 1540 by Jeneal Chiquita HERO, RN Outcome: Adequate for Discharge   Problem: Skin Integrity: Goal: Risk for impaired skin integrity will decrease 05/27/2024 1541 by Jeneal Chiquita HERO, RN Outcome: Completed/Met 05/27/2024 1540 by Jeneal Chiquita HERO, RN Outcome: Adequate for Discharge

## 2024-05-27 NOTE — Discharge Instructions (Signed)
 WATCHMANT Procedure, Care After (Aborted)  Procedure MD: Dr. Cindie Olds Clinical Coordinator: Rockie Redman, RN  This sheet gives you information about how to care for yourself after your procedure. Your health care provider may also give you more specific instructions. If you have problems or questions, contact your health care provider.  What can I expect after the procedure? After the procedure, it is common to have: Bruising around your puncture site. Tenderness around your puncture site. Tiredness (fatigue).  Medication instructions It is very important to continue to take your blood thinner as directed by your doctor after the procedure. Call your procedure doctor's office with question or concerns. If you are on Coumadin  (warfarin), you will have your INR checked the week after your procedure, with a goal INR of 2.0 - 3.0. Please follow your medication instructions on your discharge summary. Only take the medications listed on your discharge paperwork.   Follow these instructions at home: Puncture site care  Follow instructions from your health care provider about how to take care of your puncture site. Make sure you: If present, leave stitches (sutures), skin glue, or adhesive strips in place.  If a large square bandage is present, this may be removed 24 hours after surgery.  Check your puncture site every day for signs of infection. Check for: Redness, swelling, or pain. Fluid or blood. If your puncture site starts to bleed, lie down on your back, apply firm pressure to the area, and contact your health care provider. Warmth. Pus or a bad smell. Driving Do not drive yourself home if you received sedation Do not drive for at least 4 days after your procedure or however long your health care provider recommends. (Do not resume driving if you have previously been instructed not to drive for other health reasons.) Do not spend greater than 1 hour at a time in a car for the  first 3 days. Stop and take a break with a 5 minute walk at least every hour.  Do not drive or use heavy machinery while taking prescription pain medicine.  Activity Avoid activities that take a lot of effort, including exercise, for at least 7 days after your procedure. For the first 3 days, avoid sitting for longer than one hour at a time.  Avoid alcoholic beverages, signing paperwork, or participating in legal proceedings for 24 hours after receiving sedation Do not lift anything that is heavier than 10 lb (4.5 kg) for one week.  No sexual activity for 1 week.  Return to your normal activities as told by your health care provider. Ask your health care provider what activities are safe for you. General instructions Take over-the-counter and prescription medicines only as told by your health care provider. Do not use any products that contain nicotine  or tobacco, such as cigarettes and e-cigarettes. If you need help quitting, ask your health care provider. You may shower after 24 hours, but Do not take baths, swim, or use a hot tub for 1 week.  Do not drink alcohol for 24 hours after your procedure. Keep all follow-up visits as told by your health care provider. This is important. Dental Work: You will require antibiotics prior to any dental work, including cleanings, for 6 months after your Watchman implantation to help protect you from infection. After 6 months, antibiotics are no longer required. Contact a health care provider if: You have redness, mild swelling, or pain around your puncture site. You have soreness in your throat or at your puncture site  that does not improve after several days You have fluid or blood coming from your puncture site that stops after applying firm pressure to the area. Your puncture site feels warm to the touch. You have pus or a bad smell coming from your puncture site. You have a fever. You have chest pain or discomfort that spreads to your neck, jaw, or  arm. You are sweating a lot. You feel nauseous. You have a fast or irregular heartbeat. You have shortness of breath. You are dizzy or light-headed and feel the need to lie down. You have pain or numbness in the arm or leg closest to your puncture site. Get help right away if: Your puncture site suddenly swells. Your puncture site is bleeding and the bleeding does not stop after applying firm pressure to the area. These symptoms may represent a serious problem that is an emergency. Do not wait to see if the symptoms will go away. Get medical help right away. Call your local emergency services (911 in the [place] Do not drive yourself to the hospital. Summary After the procedure, it is normal to have bruising and tenderness at the puncture site in your groin, neck, or forearm. Check your puncture site every day for signs of infection. Get help right away if your puncture site is bleeding and the bleeding does not stop after applying firm pressure to the area. This is a medical emergency.  This information is not intended to replace advice given to you by your health care provider. Make sure you discuss any questions you have with your health care provider.

## 2024-05-27 NOTE — Telephone Encounter (Signed)
 Referral has been placed.

## 2024-05-27 NOTE — Anesthesia Procedure Notes (Signed)
 Procedure Name: Intubation Date/Time: 05/27/2024 7:42 AM  Performed by: Zelphia Norleen HERO, CRNAPre-anesthesia Checklist: Patient identified, Emergency Drugs available, Suction available and Patient being monitored Patient Re-evaluated:Patient Re-evaluated prior to induction Oxygen Delivery Method: Circle system utilized Preoxygenation: Pre-oxygenation with 100% oxygen Induction Type: IV induction Ventilation: Mask ventilation without difficulty Laryngoscope Size: Mac and 3 Grade View: Grade I Tube type: Oral Tube size: 7.0 mm Number of attempts: 1 Airway Equipment and Method: Stylet Placement Confirmation: ETT inserted through vocal cords under direct vision, positive ETCO2 and breath sounds checked- equal and bilateral Secured at: 21 cm Tube secured with: Tape Dental Injury: Teeth and Oropharynx as per pre-operative assessment

## 2024-05-27 NOTE — Progress Notes (Signed)
 Bedrest completed. Right groin level 0. Ambulated to restroom w/o difficulty. Ate [place] sandwich lunch box.

## 2024-05-27 NOTE — Anesthesia Postprocedure Evaluation (Signed)
 Anesthesia Post Note  Patient: Dawn Sherman  Procedure(s) Performed: LEFT ATRIAL APPENDAGE OCCLUSION TRANSESOPHAGEAL ECHOCARDIOGRAM     Patient location during evaluation: PACU Anesthesia Type: General Level of consciousness: awake and alert Pain management: pain level controlled Vital Signs Assessment: post-procedure vital signs reviewed and stable Respiratory status: spontaneous breathing, nonlabored ventilation, respiratory function stable and patient connected to nasal cannula oxygen Cardiovascular status: blood pressure returned to baseline and stable Postop Assessment: no apparent nausea or vomiting Anesthetic complications: no   No notable events documented.  Last Vitals:  Vitals:   05/27/24 1115 05/27/24 1130  BP: 137/70 (!) 151/70  Pulse: 97 97  Resp: 20 (!) 21  Temp:    SpO2: 92% 92%    Last Pain:  Vitals:   05/27/24 1001  TempSrc: Temporal  PainSc:                  [place] MARLA Cornea

## 2024-05-28 ENCOUNTER — Telehealth: Payer: Self-pay

## 2024-05-28 ENCOUNTER — Encounter (HOSPITAL_COMMUNITY): Payer: Self-pay | Admitting: Cardiology

## 2024-05-28 ENCOUNTER — Encounter (HOSPITAL_BASED_OUTPATIENT_CLINIC_OR_DEPARTMENT_OTHER): Payer: Self-pay

## 2024-05-28 NOTE — Transitions of Care (Post Inpatient/ED Visit) (Signed)
   05/28/2024  Name: Dawn Sherman MRN: [id number] DOB: 06-27-1956  Today's TOC FU Call Status: Today's TOC FU Call Status:: Unsuccessful Call (1st Attempt) Unsuccessful Call (1st Attempt) Date: 05/28/24  Attempted to reach the patient regarding the most recent Inpatient/ED visit.  Follow Up Plan: Additional outreach attempts will be made to reach the patient to complete the Transitions of Care (Post Inpatient/ED visit) call.   Arvin Seip RN, BSN, CCM [employer], Population Health Case Manager Phone: [phone]

## 2024-05-31 ENCOUNTER — Encounter: Payer: Self-pay | Admitting: Student in an Organized Health Care Education/Training Program

## 2024-05-31 ENCOUNTER — Ambulatory Visit: Admitting: Student in an Organized Health Care Education/Training Program

## 2024-05-31 ENCOUNTER — Telehealth: Payer: Self-pay

## 2024-05-31 ENCOUNTER — Other Ambulatory Visit: Payer: Self-pay

## 2024-05-31 VITALS — BP 138/80 | HR 97 | Temp 97.5°F | Ht 65.0 in | Wt 114.2 lb

## 2024-05-31 DIAGNOSIS — J439 Emphysema, unspecified: Secondary | ICD-10-CM

## 2024-05-31 DIAGNOSIS — Z23 Encounter for immunization: Secondary | ICD-10-CM

## 2024-05-31 LAB — POCT ACTIVATED CLOTTING TIME: Activated Clotting Time: 291 s

## 2024-05-31 MED ORDER — UMECLIDINIUM-VILANTEROL 62.5-25 MCG/ACT IN AEPB: 1.0000 | INHALATION_SPRAY | Freq: Every day | RESPIRATORY_TRACT | 12 refills | Status: AC

## 2024-05-31 NOTE — Patient Instructions (Signed)
 Visit Information  Thank you for taking time to visit with me today. Please don't hesitate to contact me if I can be of assistance to you before our next scheduled appointment.  Your next care management appointment is - No further appointments - closing from Care Management as requested. Please contact Pcp office to schedule with Pharmacist if concerns with Anoro Ellipta cost.   Please call the care guide team at [phone] if you need to cancel, schedule, or reschedule an appointment.   Please call the Suicide and Crisis Lifeline: 988 call the [place]  National Suicide Prevention Lifeline: 587 644 0712 or TTY: 424 360 3994 TTY (332)251-5169) to talk to a trained counselor call 1-800-273-TALK (toll free, 24 hour hotline) go to [facility] [address] [place] [phone]) call 911 if you are experiencing a Mental Health or Behavioral Health Crisis or need someone to talk to.  Nestora Duos, MSN, RN [facility], [facility] RN Care Manager Direct Dial: [phone] Fax: [phone]

## 2024-05-31 NOTE — Patient Instructions (Signed)
 Visit Information  Thank you for taking time to visit with me today. Please don't hesitate to contact me if I can be of assistance to you   Patient instructions Notify provider for any new/ ongoing symptoms Notify provider for signs of infection at groin site.  Seek emergency medical services for severe symptoms: Shortness of breath, chest pain, Stroke like symptoms Take medications as prescribe Keep follow up appointments with provider.    Patient verbalizes understanding of instructions and care plan provided today and agrees to view in MyChart. Active MyChart status and patient understanding of how to access instructions and care plan via MyChart confirmed with patient.     The patient has been provided with contact information for the care management team and has been advised to call with any health related questions or concerns.   Please call the care guide team at [phone] if you need to cancel or reschedule your appointment.   Please call the Suicide and Crisis Lifeline: 988 call the [place]  National Suicide Prevention Lifeline: 720 453 7366 or TTY: 6477737258 TTY 986-223-5100) to talk to a trained counselor call 1-800-273-TALK (toll free, 24 hour hotline) if you are experiencing a Mental Health or Behavioral Health Crisis or need someone to talk to.  Arvin Seip RN, BSN, CCM [employer], Population Health Case Manager Phone: [phone]

## 2024-05-31 NOTE — Progress Notes (Signed)
 Assessment & Plan:   #Chronic obstructive pulmonary disease #Emphysema #History of Tobacco Use  COPD diagnosis confirmed by pulmonary function test in August, showing improvement with albuterol . Symptoms are well-managed with no recent exacerbations, hospitalizations, or need for antibiotics or prednisone . She has quit smoking. Previously prescribed Trelegy was too expensive, and she is not currently using any long-acting inhalers. We will attempt to start her on a long acting LABA/LAMA inhaler, and re-evaluate symptoms on follow up.  - umeclidinium-vilanterol (ANORO ELLIPTA) 62.5-25 MCG/ACT AEPB; Inhale 1 puff into the lungs daily.  Dispense: 30 each; Refill: 12 - Advise to check with pharmacy regarding insurance coverage for the inhaler. - Instruct to call the office if the inhaler is not covered by insurance.  - Flu shot Today   Return in about 6 months (around 11/29/2024).  I spent 30 minutes caring for this patient today, including preparing to see the patient, obtaining a medical history , reviewing a separately obtained history, performing a medically appropriate examination and/or evaluation, counseling and educating the patient/family/caregiver, ordering medications, tests, or procedures, documenting clinical information in the electronic health record, and independently interpreting results (not separately reported/billed) and communicating results to the patient/family/caregiver  Dawn November, MD [facility]   End of visit medications:  Meds ordered this encounter  Medications   umeclidinium-vilanterol (ANORO ELLIPTA) 62.5-25 MCG/ACT AEPB    Sig: Inhale 1 puff into the lungs daily.    Dispense:  30 each    Refill:  12     Current Outpatient Medications:    acetaminophen  (TYLENOL ) 325 MG tablet, Take 650 mg by mouth every 6 (six) hours as needed for mild pain (pain score 1-3) or moderate pain (pain score 4-6) (pain.)., Disp: , Rfl:    albuterol   (VENTOLIN  HFA) 108 (90 Base) MCG/ACT inhaler, Inhale 2 puffs into the lungs every 6 (six) hours as needed for wheezing or shortness of breath., Disp: 18 g, Rfl: 0   apixaban  (ELIQUIS ) 2.5 MG TABS tablet, Take 1 tablet (2.5 mg total) by mouth 2 (two) times daily., Disp: 56 tablet, Rfl:    atorvastatin  (LIPITOR) 40 MG tablet, TAKE ONE TABLET BY MOUTH EVERY DAY, Disp: 90 tablet, Rfl: 1   buPROPion  (WELLBUTRIN  XL) 150 MG 24 hr tablet, Take 1 tablet (150 mg total) by mouth daily., Disp: 90 tablet, Rfl: 1   calcitRIOL  (ROCALTROL ) 0.25 MCG capsule, Take 0.25 mcg by mouth daily., Disp: , Rfl:    calcium  carbonate (OS-CAL - DOSED IN MG OF ELEMENTAL CALCIUM ) 1250 (500 Ca) MG tablet, Take 1 tablet (500 mg of elemental calcium  total) by mouth 3 (three) times daily with meals., Disp: 60 tablet, Rfl: 1   carvedilol  (COREG ) 3.125 MG tablet, Take 1 tablet (3.125 mg total) by mouth 2 (two) times daily., Disp: 180 tablet, Rfl: 3   FARXIGA  10 MG TABS tablet, Take 1 tablet (10 mg total) by mouth daily., Disp: 90 tablet, Rfl: 3   hydrALAZINE  (APRESOLINE ) 25 MG tablet, Take 1 tablet (25 mg total) by mouth 3 (three) times daily., Disp: 90 tablet, Rfl: 5   isosorbide  mononitrate (IMDUR ) 30 MG 24 hr tablet, Take 1 tablet (30 mg total) by mouth daily., Disp: 90 tablet, Rfl: 3   levothyroxine  (SYNTHROID ) 75 MCG tablet, TAKE ONE TABLET BY MOUTH DAILY BEFORE BREAKFAST, Disp: 30 tablet, Rfl: 1   loratadine  (CLARITIN ) 10 MG tablet, Take 10 mg by mouth daily., Disp: , Rfl:    omeprazole  (PRILOSEC) 20 MG capsule, Take 1 capsule (20  mg total) by mouth daily., Disp: , Rfl:    torsemide  (DEMADEX ) 20 MG tablet, Take 20-60 mg by mouth See admin instructions. Tale 60 mg in the morning and 20 mg in the afternoon if notice swelling, Disp: , Rfl:    umeclidinium-vilanterol (ANORO ELLIPTA) 62.5-25 MCG/ACT AEPB, Inhale 1 puff into the lungs daily., Disp: 30 each, Rfl: 12   venlafaxine  XR (EFFEXOR -XR) 150 MG 24 hr capsule, TAKE ONE CAPSULE BY  MOUTH EVERY DAY, Disp: 90 capsule, Rfl: 1   Subjective:   PATIENT ID: Dawn Sherman GENDER: female DOB: February 07, 1956, MRN: [id number]  Chief Complaint  Patient presents with   Shortness of Breath    DOE. No wheezing or cough.    HPI  Discussed the use of AI scribe software for clinical note transcription with the patient, who gave verbal consent to proceed.  Dawn Sherman is a 68 year old female with CKD and COPD who presents for follow-up after an unsuccessful Watchman procedure.  Patient is followed for CKD by Dr. Dennise, and was referred to [facility] for a transplant evaluation. She has a history of Afib and HFrecEF followed closely by Dr. Rolan. She [facility] and Aliene Riding, MD from nephrology). Given history of smoking and concern for COPD, as well as overnight oxygen use, she was referred to pulmonary for an evaluation, as oxygen use would preclude her transplant candidacy.   Return Visit 01/12/2024:   Patient was last seen in clinic in March 2025 at which point PFTs and an overnight pulse oximetry study were ordered.  She has since had her overnight pulse oximetry which was normal and did not show any signs of hypoxia and subsequently oxygen therapy was discontinued.  She was unable to get her PFTs nor was she reachable for lung cancer screening.  She was admitted in April to the hospital secondary to bradycardia in the setting of AKI and medication accumulation.  This has since resolved. She continues to feel well and is at her baseline in terms of shortness of breath.  She does not have any other respiratory symptoms and only reports very minimal exertional dyspnea.  She does not have any cough, wheeze, chest tightness, sputum production, or chest pain.  She is not on any inhalers.   Return Visit 03/24/2024:   Since our last visit, we obtained over night oximetry that was normal, and we  discontinued her overnight oxygen. She was then admitted to the hospital with a Rhinovirus infection end of June 2025 resulted in acute hypoxic respiratory failure concerning for COPD exacerbation. Patient has not yet scheduled her PFT's. Today, she feels back to baseline and has no symptoms. She was discharged home with oxygen, but is using it only nocturnally. She has no cough, wheeze, tightness, or shortness of breath. She has albuterol  ordered but does not use it. She feels back to baseline.  Return Visit 05/31/2024:  She recently underwent a Watchman procedure which was unsuccessful due to instability of the device, leading to the procedure being aborted after multiple attempts. She has been referred to [facility] for further evaluation, but has not yet been contacted for an appointment.  Her breathing is generally stable with no wheezing, chest pain, chest tightness, or cough. She rates her breathing symptoms as a ten out of ten, indicating no issues. She has not required antibiotics or prednisone  since her last visit. She had a  pulmonary function test conducted in August, and during the test, she was given albuterol , but she does not recall experiencing improvement with it. She quit smoking and does not use her rescue inhaler often, keeping it in her purse for emergencies.  She was previously prescribed Trelegy, which was expensive, and she believes it was prescribed by a different doctor during a past hospital stay.    Patient reports a history of smoking but has quit. She reports smoking up to half a pack a day for over 40 years. Likely with 20 to 30 pack years of smoking history. Denies any occupational exposures.    Medical record reviewed in length.  -She had a history of alcohol abuse, managed with PCP with report of abstinence as far back as 2020.  -She was admitted to the hospital in June of 2021 with shortness of breath and acute decompensated HFrEF (EF 30%) and afib, managed with  GDMT and Apixaban .  -Repeat echo a few months later showed EF recovery.  -She was re-admitted 07/2021 with shortness of breath to the hospital with hypoxic respiratory failure felt to be secondary to a COPD exacerbation. She was discharged on oxygen (2L via [place]) with plan to follow up with PCP and pulmonary.  -She had a fall 11/2021 and admitted with finding of subarachnoid hemorrhage, noted to be stable on repeat imaging and managed conservatively. Fall felt to be secondary to second degree heart block in the setting of beta blocker use (discontinued).  -She was re-admitted July of 2023 due to decompensated heart failure and treated with IV diuretics.  -Another admission noted Sherman of 2023 for hypoxic respiratory failure secondary to decompensated heart failure and Afib with RVR. She was discharged on room air during said admission. -Again admitted with hypoxia and shortness of breath January of 2024, treated for decompensated heart failure, and discharged home on 3L via [place] -admit 09/2022 with hypoxia, shortness of breath, found to have a pleural effusion (right sided) s/p thoracentesis. -March/2024: admit with decompensated heart failure, required RHC x2 and aggressive diuresis with ionotropic support (milrinone ) -RHC 10/2022 RA 21, RV 62/24, PA 63/38 (46), PAOP 34, CO/CI 3.4/2, PVR 3.5 wood -RHC 11/2022 RA 6, RV 50/3, PA 50/17 (30), PAOP 16, LVEDP 15, CO/CI 6.08/3.7. LHC normal coronaries. -07/2023: afib ablatio (pulmonary vein isolation, posterior wall ablation, cavotricuspid isthums ablation for flutter) -Admission 11/2023 with Sinus bradycardia due to AKI and medication side effects -Admission 01/2024 with acute hypoxic respiratory failure due to rhinovirus felt to have triggered a COPD exacerbation   Ancillary information including prior medications, full medical/surgical/family/social histories, and PFTs (when available) are listed below and have been reviewed.    Review of Systems   Constitutional:  Negative for chills, fever, malaise/fatigue and weight loss.  Respiratory:  Positive for shortness of breath. Negative for cough, hemoptysis, sputum production and wheezing.   Cardiovascular:  Negative for chest pain.     Objective:   Vitals:   05/31/24 1004  BP: 138/80  Pulse: 97  Temp: (!) 97.5 F (36.4 C)  SpO2: 95%  Weight: 114 lb 3.2 oz (51.8 kg)  Height: 5' 5 (1.651 m)   95% on RA  BMI Readings from Last 3 Encounters:  05/31/24 19.00 kg/m  05/27/24 19.14 kg/m  05/10/24 18.89 kg/m   Wt Readings from Last 3 Encounters:  05/31/24 114 lb 3.2 oz (51.8 kg)  05/27/24 115 lb (52.2 kg)  05/10/24 113 lb 8 oz (51.5 kg)    Physical Exam Constitutional:  Appearance: Normal appearance.  Cardiovascular:     Rate and Rhythm: Normal rate and regular rhythm.     Pulses: Normal pulses.     Heart sounds: Normal heart sounds.  Pulmonary:     Effort: Pulmonary effort is normal.     Breath sounds: Normal breath sounds. No wheezing or rhonchi.  Neurological:     General: No focal deficit present.     Mental Status: She is alert and oriented to person, place, and time. Mental status is at baseline.       Ancillary Information    Past Medical History:  Diagnosis Date   B12 deficiency    Cardiomyopathy (HCC)    a. 09/2022 Echo: EF 35-40%; b. 09/2022 MV: apical defect w/ mild peri-infarct ischemia. EF 43%.   Chronic HFrEF (heart failure with reduced ejection fraction) (HCC)    a. 01/2020 Echo: EF 30-35%; b. 07/2020 Echo: EF 50-55%; c. 06/2022 Echo: EF 50-55%; d. 09/2022 Echo: EF 35-40%, globh HK, mod reduced RV fxn, sev BAE, mod MR.   CKD (chronic kidney disease), stage IV (HCC)    COPD (chronic obstructive pulmonary disease) (HCC)    Depression    Endometriosis    Hypertension    Mixed hyperlipidemia    Moderate mitral regurgitation    Normocytic anemia    PAF (paroxysmal atrial fibrillation) (HCC)    a. CHA2DS2VASc = 4-->eliquis  2.5 bid.    Paroxymsal Atrial flutter (HCC)    Tobacco abuse      Family History  Problem Relation Age of Onset   Hypercholesterolemia Mother    Rheum arthritis Father    Rheum arthritis Daughter    Fibromyalgia Daughter    Breast cancer Neg Hx    Colon cancer Neg Hx      Past Surgical History:  Procedure Laterality Date   ABDOMINAL HYSTERECTOMY     ATRIAL FIBRILLATION ABLATION N/A 08/04/2023   Procedure: ATRIAL FIBRILLATION ABLATION;  Surgeon: Cindie Ole DASEN, MD;  Location: [facility] INVASIVE CV LAB;  Service: Cardiovascular;  Laterality: N/A;   BREAST BIOPSY Right 2015   neg-  FIBROADENOMA   CENTRAL LINE INSERTION  11/15/2022   Procedure: CENTRAL LINE INSERTION;  Surgeon: Mady Bruckner, MD;  Location: [facility] INVASIVE CV LAB;  Service: Cardiovascular;;   COLONOSCOPY WITH PROPOFOL  N/A 11/24/2022   Procedure: COLONOSCOPY WITH PROPOFOL ;  Surgeon: Maryruth Ole DASEN, MD;  Location: [facility] ENDOSCOPY;  Service: Endoscopy;  Laterality: N/A;   COLONOSCOPY WITH PROPOFOL  N/A 11/23/2022   Procedure: COLONOSCOPY WITH PROPOFOL ;  Surgeon: Maryruth Ole DASEN, MD;  Location: [facility] ENDOSCOPY;  Service: Endoscopy;  Laterality: N/A;   ESOPHAGOGASTRODUODENOSCOPY (EGD) WITH PROPOFOL  N/A 11/23/2022   Procedure: ESOPHAGOGASTRODUODENOSCOPY (EGD) WITH PROPOFOL ;  Surgeon: Maryruth Ole DASEN, MD;  Location: [facility] ENDOSCOPY;  Service: Endoscopy;  Laterality: N/A;   LEFT ATRIAL APPENDAGE OCCLUSION N/A 05/27/2024   Procedure: LEFT ATRIAL APPENDAGE OCCLUSION;  Surgeon: Cindie Ole DASEN, MD;  Location: [facility] INVASIVE CV LAB;  Service: Cardiovascular;  Laterality: N/A;   RIGHT HEART CATH N/A 11/15/2022   Procedure: RIGHT HEART CATH;  Surgeon: Mady Bruckner, MD;  Location: [facility] INVASIVE CV LAB;  Service: Cardiovascular;  Laterality: N/A;   RIGHT/LEFT HEART CATH AND CORONARY ANGIOGRAPHY N/A 11/25/2022   Procedure: RIGHT/LEFT HEART CATH AND CORONARY ANGIOGRAPHY;  Surgeon: Darron Deatrice LABOR, MD;  Location: [facility] INVASIVE CV LAB;  Service:  Cardiovascular;  Laterality: N/A;   TAH/RSO  1999   secondary to bleeding and endometriosis (Dr Trula)   TRANSESOPHAGEAL ECHOCARDIOGRAM (CATH LAB) N/A 08/04/2023  Procedure: TRANSESOPHAGEAL ECHOCARDIOGRAM;  Surgeon: Cindie Ole DASEN, MD;  Location: [facility] INVASIVE CV LAB;  Service: Cardiovascular;  Laterality: N/A;   TRANSESOPHAGEAL ECHOCARDIOGRAM (CATH LAB) N/A 05/27/2024   Procedure: TRANSESOPHAGEAL ECHOCARDIOGRAM;  Surgeon: Cindie Ole DASEN, MD;  Location: [facility] INVASIVE CV LAB;  Service: Cardiovascular;  Laterality: N/A;    Social History   Socioeconomic History   Marital status: Married    Spouse name: Not on file   Number of children: Not on file   Years of education: Not on file   Highest education level: 12th grade  Occupational History   Not on file  Tobacco Use   Smoking status: Former    Current packs/day: 0.00    Average packs/day: 0.5 packs/day for 49.0 years (24.5 ttl pk-yrs)    Types: Cigarettes    Start date: 46    Quit date: 2024    Years since quitting: 1.7   Smokeless tobacco: Never   Tobacco comments:    Patient quit smoking recently \\Quit  6 months ago  Vaping Use   Vaping status: Never Used  Substance and Sexual Activity   Alcohol use: Not Currently    Alcohol/week: 0.0 standard drinks of alcohol    Comment: occasional   Drug use: No   Sexual activity: Not Currently  Other Topics Concern   Not on file  Social History Narrative   Lives in [place] with her husband.  She is retired from [employer]	[place].  She does not routinely exercise.   Social Drivers of Corporate investment banker Strain: Low Risk  (12/15/2023)   Overall Financial Resource Strain (CARDIA)    Difficulty of Paying Living Expenses: Not hard at all  Food Insecurity: No Food Insecurity (05/03/2024)   Hunger Vital Sign    Worried About Running Out of Food in the Last Year: Never true    Ran Out of Food in the Last Year: Never true  Transportation Needs: No Transportation Needs  (05/03/2024)   PRAPARE - Administrator, Civil Service (Medical): No    Lack of Transportation (Non-Medical): No  Physical Activity: Inactive (12/15/2023)   Exercise Vital Sign    Days of Exercise per Week: 0 days    Minutes of Exercise per Session: 0 min  Stress: No Stress Concern Present (12/15/2023)   [employer] of Occupational Health - Occupational Stress Questionnaire    Feeling of Stress : Only a little  Social Connections: Socially Integrated (02/24/2024)   Social Connection and Isolation Panel    Frequency of Communication with Friends and Family: Three times a week    Frequency of Social Gatherings with Friends and Family: Three times a week    Attends Religious Services: 1 to 4 times per year    Active Member of Clubs or Organizations: Yes    Attends Banker Meetings: 1 to 4 times per year    Marital Status: Married  Catering manager Violence: Not At Risk (05/03/2024)   Humiliation, Afraid, Rape, and Kick questionnaire    Fear of Current or Ex-Partner: No    Emotionally Abused: No    Physically Abused: No    Sexually Abused: No     Allergies  Allergen Reactions   Sulfate Rash   Codeine Sulfate Nausea Only   Benicar [Olmesartan]     Talked with patient February 10, 2020, intolerance is unclear, tried several medications around that time and one of them gave her a rash but she is not clear which  1.   Amoxicillin Rash   Clindamycin/Lincomycin Rash   Entresto  [Sacubitril -Valsartan ] Other (See Comments)    hyperkalemia   Morphine And Codeine Rash   Penicillins Rash     CBC    Component Value Date/Time   WBC 8.8 05/10/2024 0922   WBC 12.3 (H) 03/10/2024 1854   RBC 4.65 05/10/2024 0922   HGB 14.4 05/10/2024 0922   HGB 13.1 04/12/2024 1001   HCT 44.3 05/10/2024 0922   HCT 41.8 04/12/2024 1001   PLT 148 (L) 05/10/2024 0922   PLT 162 04/12/2024 1001   MCV 95.3 05/10/2024 0922   MCV 97 04/12/2024 1001   MCH 31.0 05/10/2024 0922   MCHC 32.5  05/10/2024 0922   RDW 14.6 05/10/2024 0922   RDW 13.0 04/12/2024 1001   LYMPHSABS 0.9 05/10/2024 0922   MONOABS 0.7 05/10/2024 0922   EOSABS 0.1 05/10/2024 0922   BASOSABS 0.1 05/10/2024 0922    Pulmonary Functions Testing Results:    Latest Ref Rng & Units 04/02/2024   12:18 PM  PFT Results  FVC-Pre L 1.88   FVC-Predicted Pre % 58   FVC-Post L 2.15   FVC-Predicted Post % 67   Pre FEV1/FVC % % 55   Post FEV1/FCV % % 58   FEV1-Pre L 1.03   FEV1-Predicted Pre % 42   FEV1-Post L 1.24   DLCO uncorrected ml/min/mmHg 11.04   DLCO UNC% % 55   DLCO corrected ml/min/mmHg 11.11   DLCO COR %Predicted % 55   DLVA Predicted % 69   TLC L 6.33   TLC % Predicted % 121   RV % Predicted % 185     Outpatient Medications Prior to Visit  Medication Sig Dispense Refill   acetaminophen  (TYLENOL ) 325 MG tablet Take 650 mg by mouth every 6 (six) hours as needed for mild pain (pain score 1-3) or moderate pain (pain score 4-6) (pain.).     albuterol  (VENTOLIN  HFA) 108 (90 Base) MCG/ACT inhaler Inhale 2 puffs into the lungs every 6 (six) hours as needed for wheezing or shortness of breath. 18 g 0   apixaban  (ELIQUIS ) 2.5 MG TABS tablet Take 1 tablet (2.5 mg total) by mouth 2 (two) times daily. 56 tablet    atorvastatin  (LIPITOR) 40 MG tablet TAKE ONE TABLET BY MOUTH EVERY DAY 90 tablet 1   buPROPion  (WELLBUTRIN  XL) 150 MG 24 hr tablet Take 1 tablet (150 mg total) by mouth daily. 90 tablet 1   calcitRIOL  (ROCALTROL ) 0.25 MCG capsule Take 0.25 mcg by mouth daily.     calcium  carbonate (OS-CAL - DOSED IN MG OF ELEMENTAL CALCIUM ) 1250 (500 Ca) MG tablet Take 1 tablet (500 mg of elemental calcium  total) by mouth 3 (three) times daily with meals. 60 tablet 1   carvedilol  (COREG ) 3.125 MG tablet Take 1 tablet (3.125 mg total) by mouth 2 (two) times daily. 180 tablet 3   FARXIGA  10 MG TABS tablet Take 1 tablet (10 mg total) by mouth daily. 90 tablet 3   hydrALAZINE  (APRESOLINE ) 25 MG tablet Take 1 tablet (25  mg total) by mouth 3 (three) times daily. 90 tablet 5   isosorbide  mononitrate (IMDUR ) 30 MG 24 hr tablet Take 1 tablet (30 mg total) by mouth daily. 90 tablet 3   levothyroxine  (SYNTHROID ) 75 MCG tablet TAKE ONE TABLET BY MOUTH DAILY BEFORE BREAKFAST 30 tablet 1   loratadine  (CLARITIN ) 10 MG tablet Take 10 mg by mouth daily.     omeprazole  (PRILOSEC) 20 MG capsule  Take 1 capsule (20 mg total) by mouth daily.     torsemide  (DEMADEX ) 20 MG tablet Take 20-60 mg by mouth See admin instructions. Tale 60 mg in the morning and 20 mg in the afternoon if notice swelling     venlafaxine  XR (EFFEXOR -XR) 150 MG 24 hr capsule TAKE ONE CAPSULE BY MOUTH EVERY DAY 90 capsule 1   No facility-administered medications prior to visit.

## 2024-05-31 NOTE — Transitions of Care (Post Inpatient/ED Visit) (Signed)
 05/31/2024  Name: Dawn Sherman MRN: [id number] DOB: 1956/02/14  Today's TOC FU Call Status: Today's TOC FU Call Status:: Successful TOC FU Call Completed TOC FU Call Complete Date: 05/31/24 Patient's Name and Date of Birth confirmed.  Transition Care Management Follow-up Telephone Call Date of Discharge: 05/27/24 Discharge Facility: [facility]) Type of Discharge: Inpatient Admission Primary Inpatient Discharge Diagnosis:: persistent atrial fibrillation How have you been since you were released from the hospital?: Better Any questions or concerns?: No  Items Reviewed: Did you receive and understand the discharge instructions provided?: No Medications obtained,verified, and reconciled?: Yes (Medications Reviewed) Any new allergies since your discharge?: No Dietary orders reviewed?: Yes Type of Diet Ordered:: low salt heart healthy Do you have support at home?: Yes People in Home [RPT]: spouse Name of Support/Comfort Primary Source: Cindie Rajagopalan  Medications Reviewed Today: Medications Reviewed Today     Reviewed by Rafaela Dinius E, RN (Registered Nurse) on 05/31/24 at 1532  Med List Status: <None>   Medication Order Taking? Sig Documenting Provider Last Dose Status Informant  acetaminophen  (TYLENOL ) 325 MG tablet [id number] Yes Take 650 mg by mouth every 6 (six) hours as needed for mild pain (pain score 1-3) or moderate pain (pain score 4-6) (pain.). [provider]  Active Self           Med Note CHRISTIE, ALYSON   Tue Apr 20, 2024  4:35 PM)    albuterol  (VENTOLIN  HFA) 108 [phone] Base) MCG/ACT inhaler [id number] Yes Inhale 2 puffs into the lungs every 6 (six) hours as needed for wheezing or shortness of breath. Glendia Shad, MD  Active Self  apixaban  (ELIQUIS ) 2.5 MG TABS tablet [id number] Yes Take 1 tablet (2.5 mg total) by mouth 2 (two) times daily. Wonda Sharper, MD  Active   atorvastatin  (LIPITOR) 40 MG tablet [id number] Yes TAKE ONE TABLET BY MOUTH EVERY DAY  Glendia Shad, MD  Active Self  buPROPion  (WELLBUTRIN  XL) 150 MG 24 hr tablet [id number] Yes Take 1 tablet (150 mg total) by mouth daily. Glendia Shad, MD  Active Self  calcitRIOL  (ROCALTROL ) 0.25 MCG capsule [id number] Yes Take 0.25 mcg by mouth daily. [provider]  Active Self  calcium  carbonate (OS-CAL - DOSED IN MG OF ELEMENTAL CALCIUM ) 1250 (500 Ca) MG tablet [id number] Yes Take 1 tablet (500 mg of elemental calcium  total) by mouth 3 (three) times daily with meals. Patel, Sona, MD  Active Self  carvedilol  (COREG ) 3.125 MG tablet [id number] Yes Take 1 tablet (3.125 mg total) by mouth 2 (two) times daily. Riddle, Suzann, NP  Active Self  FARXIGA  10 MG TABS tablet [id number] Yes Take 1 tablet (10 mg total) by mouth daily. Glendia Shad, MD  Active Self           Med Note CHRISTIE ALYSON   Tue Apr 20, 2024  4:36 PM) On hold  hydrALAZINE  (APRESOLINE ) 25 MG tablet [id number] Yes Take 1 tablet (25 mg total) by mouth 3 (three) times daily. Rolan Ezra RAMAN, MD  Active   isosorbide  mononitrate (IMDUR ) 30 MG 24 hr tablet [id number] Yes Take 1 tablet (30 mg total) by mouth daily. Rolan Ezra RAMAN, MD  Active   levothyroxine  (SYNTHROID ) 75 MCG tablet [id number] Yes TAKE ONE TABLET BY MOUTH DAILY BEFORE OFILIA Glendia Shad, MD  Active   loratadine  (CLARITIN ) 10 MG tablet [id number] Yes Take 10 mg by mouth daily. [provider]  Active Self  omeprazole  (PRILOSEC) 20 MG capsule [id number] Yes Take 1 capsule (20 mg  total) by mouth daily. Riddle, Suzann, NP  Active Self  torsemide  (DEMADEX ) 20 MG tablet [id number] Yes Take 20-60 mg by mouth See admin instructions. Tale 60 mg in the morning and 20 mg in the afternoon if notice swelling [provider]  Active Self  umeclidinium-vilanterol (ANORO ELLIPTA) 62.5-25 MCG/ACT AEPB [id number]  Inhale 1 puff into the lungs daily.  Patient not taking: Reported on 05/31/2024   Isadora Hose, MD  Active   venlafaxine  XR (EFFEXOR -XR) 150  MG 24 hr capsule [id number] Yes TAKE ONE CAPSULE BY MOUTH EVERY DAY Glendia Shad, MD  Active             Home Care and Equipment/Supplies: Were Home Health Services Ordered?: No Any new equipment or medical supplies ordered?: No  Functional Questionnaire: Do you need assistance with bathing/showering or dressing?: No Do you need assistance with meal preparation?: No Do you need assistance with eating?: No Do you have difficulty maintaining continence: No Do you need assistance with getting out of bed/getting out of a chair/moving?: No Do you have difficulty managing or taking your medications?: No  Follow up appointments reviewed: PCP Follow-up appointment confirmed?: Yes Date of PCP follow-up appointment?: 06/03/24 Follow-up Provider: Dr. Shad Glendia Specialist [facility] Follow-up appointment confirmed?: Yes Date of Specialist follow-up appointment?: 06/07/24 Follow-Up Specialty Provider:: Dr. Beecher Do you need transportation to your follow-up appointment?: No Do you understand care options if your condition(s) worsen?: Yes-patient verbalized understanding  SDOH Interventions Today    Flowsheet Row Most Recent Value  SDOH Interventions   Food Insecurity Interventions Intervention Not Indicated  Housing Interventions Intervention Not Indicated  Transportation Interventions Intervention Not Indicated  Utilities Interventions Intervention Not Indicated   Discussed and offered 30 day TOC program.  Patient  declined.  Patient agreeable to ongoing follow up with her CCM RN case manager,  Mariadora weeks.  The patient has been provided with contact information for the care management team and has been advised to call with any health -related questions or concerns.  The patient verbalized understanding with current plan of care.  The patient is directed to their insurance card regarding availability of benefits coverage.    Arvin Seip RN, BSN, CCM [employer], Population Health Case Manager Phone: [phone]

## 2024-05-31 NOTE — Patient Outreach (Signed)
 Complex Care Management   Visit Note  05/31/2024  Name:  Dawn Sherman MRN: [id number] DOB: Feb 21, 1956  Situation: Referral received for Complex Care Management related to Heart Failure, COPD, and Atrial Fibrillation I obtained verbal consent from Patient.  Visit completed with Patient  on the phone  Background:   Past Medical History:  Diagnosis Date   B12 deficiency    Cardiomyopathy (HCC)    a. 09/2022 Echo: EF 35-40%; b. 09/2022 MV: apical defect w/ mild peri-infarct ischemia. EF 43%.   Chronic HFrEF (heart failure with reduced ejection fraction) (HCC)    a. 01/2020 Echo: EF 30-35%; b. 07/2020 Echo: EF 50-55%; c. 06/2022 Echo: EF 50-55%; d. 09/2022 Echo: EF 35-40%, globh HK, mod reduced RV fxn, sev BAE, mod MR.   CKD (chronic kidney disease), stage IV (HCC)    COPD (chronic obstructive pulmonary disease) (HCC)    Depression    Endometriosis    Hypertension    Mixed hyperlipidemia    Moderate mitral regurgitation    Normocytic anemia    PAF (paroxysmal atrial fibrillation) (HCC)    a. CHA2DS2VASc = 4-->eliquis  2.5 bid.   Paroxymsal Atrial flutter (HCC)    Tobacco abuse     Assessment: Patient Reported Symptoms:  Cognitive Cognitive Status: No symptoms reported Cognitive/Intellectual Conditions Management [RPT]: None reported or documented in medical history or problem list   Health Maintenance Behaviors: Annual physical exam, Exercise Health Facilitated by: Healthy diet  Neurological Neurological Review of Symptoms: No symptoms reported Neurological Management Strategies: Routine screening  HEENT HEENT Symptoms Reported: No symptoms reported      Cardiovascular Cardiovascular Symptoms Reported: No symptoms reported Cardiovascular Management Strategies: Medication therapy, Routine screening Cardiovascular Comment: no sx of HF or afib - Right femoral vein site - no issues  Respiratory Respiratory Symptoms Reported: No symptoms reported Additional Respiratory Details: o2  2L at night only PO2 95% without O2 Respiratory Management Strategies: Adequate rest, Oxygen therapy, Routine screening, Medication therapy  Endocrine Is patient diabetic?: No    Gastrointestinal Gastrointestinal Symptoms Reported: No symptoms reported      Genitourinary Genitourinary Symptoms Reported: No symptoms reported Genitourinary Comment: no contact with transplant team since PFT in September  Integumentary Integumentary Symptoms Reported: No symptoms reported Skin Management Strategies: Routine screening  Musculoskeletal Musculoskelatal Symptoms Reviewed: No symptoms reported Musculoskeletal Management Strategies: Routine screening Falls in the past year?: No Number of falls in past year: 1 or less Was there an injury with Fall?: No Fall Risk Category Calculator: 0 Patient Fall Risk Level: Low Fall Risk Fall risk Follow up: Falls prevention discussed  Psychosocial Psychosocial Symptoms Reported: No symptoms reported Behavioral Management Strategies: Medication therapy Behavioral Health Self-Management Outcome: 4 (good) Major Change/Loss/Stressor/Fears (CP): Medical condition, self      05/31/2024    PHQ2-9 Depression Screening   Little interest or pleasure in doing things Not at all  Feeling down, depressed, or hopeless Not at all  PHQ-2 - Total Score 0  Trouble falling or staying asleep, or sleeping too much    Feeling tired or having little energy    Poor appetite or overeating     Feeling bad about yourself - or that you are a failure or have let yourself or your family down    Trouble concentrating on things, such as reading the newspaper or watching television    Moving or speaking so slowly that other people could have noticed.  Or the opposite - being so fidgety or restless that you have been  moving around a lot more than usual    Thoughts that you would be better off dead, or hurting yourself in some way    PHQ2-9 Total Score    If you checked off any problems,  how difficult have these problems made it for you to do your work, take care of things at home, or get along with other people    Depression Interventions/Treatment      Vitals:   05/31/24 1409  BP: 130/80    Medications Reviewed Today     Reviewed by Devra Lands, RN (Registered Nurse) on 05/31/24 at 1405  Med List Status: <None>   Medication Order Taking? Sig Documenting Provider Last Dose Status Informant  acetaminophen  (TYLENOL ) 325 MG tablet [id number] Yes Take 650 mg by mouth every 6 (six) hours as needed for mild pain (pain score 1-3) or moderate pain (pain score 4-6) (pain.). [provider]  Active Self           Med Note CHRISTIE, ALYSON   Tue Apr 20, 2024  4:35 PM)    albuterol  (VENTOLIN  HFA) 108 [phone] Base) MCG/ACT inhaler [id number] Yes Inhale 2 puffs into the lungs every 6 (six) hours as needed for wheezing or shortness of breath. Glendia Shad, MD  Active Self  apixaban  (ELIQUIS ) 2.5 MG TABS tablet [id number] Yes Take 1 tablet (2.5 mg total) by mouth 2 (two) times daily. Wonda Sharper, MD  Active   atorvastatin  (LIPITOR) 40 MG tablet [id number] Yes TAKE ONE TABLET BY MOUTH EVERY DAY Glendia Shad, MD  Active Self  buPROPion  (WELLBUTRIN  XL) 150 MG 24 hr tablet [id number] Yes Take 1 tablet (150 mg total) by mouth daily. Glendia Shad, MD  Active Self  calcitRIOL  (ROCALTROL ) 0.25 MCG capsule [id number] Yes Take 0.25 mcg by mouth daily. [provider]  Active Self  calcium  carbonate (OS-CAL - DOSED IN MG OF ELEMENTAL CALCIUM ) 1250 (500 Ca) MG tablet [id number] Yes Take 1 tablet (500 mg of elemental calcium  total) by mouth 3 (three) times daily with meals. Patel, Sona, MD  Active Self  carvedilol  (COREG ) 3.125 MG tablet [id number] Yes Take 1 tablet (3.125 mg total) by mouth 2 (two) times daily. Riddle, Suzann, NP  Active Self  FARXIGA  10 MG TABS tablet [id number] Yes Take 1 tablet (10 mg total) by mouth daily. Glendia Shad, MD  Active Self           Med  Note CHRISTIE ALYSON   Tue Apr 20, 2024  4:36 PM) On hold  hydrALAZINE  (APRESOLINE ) 25 MG tablet [id number] Yes Take 1 tablet (25 mg total) by mouth 3 (three) times daily. Rolan Ezra RAMAN, MD  Active   isosorbide  mononitrate (IMDUR ) 30 MG 24 hr tablet [id number] Yes Take 1 tablet (30 mg total) by mouth daily. Rolan Ezra RAMAN, MD  Active   levothyroxine  (SYNTHROID ) 75 MCG tablet [id number] Yes TAKE ONE TABLET BY MOUTH DAILY BEFORE OFILIA Glendia Shad, MD  Active   loratadine  (CLARITIN ) 10 MG tablet [id number] Yes Take 10 mg by mouth daily. [provider]  Active Self  omeprazole  (PRILOSEC) 20 MG capsule [id number] Yes Take 1 capsule (20 mg total) by mouth daily. Riddle, Suzann, NP  Active Self  torsemide  (DEMADEX ) 20 MG tablet [id number] Yes Take 20-60 mg by mouth See admin instructions. Tale 60 mg in the morning and 20 mg in the afternoon if notice swelling [provider]  Active Self  umeclidinium-vilanterol (ANORO ELLIPTA) 62.5-25 MCG/ACT AEPB [id number]  Inhale 1 puff into the lungs  daily.  Patient not taking: Reported on 05/31/2024   Isadora Hose, MD  Active   venlafaxine  XR (EFFEXOR -XR) 150 MG 24 hr capsule [id number] Yes TAKE ONE CAPSULE BY MOUTH EVERY DAY Glendia Shad, MD  Active             Recommendation:   PCP Follow-up Continue Current Plan of Care  Follow Up Plan:   Closing From:  Complex Care Management Nestora Duos, MSN, RN [facility]  [facility], [facility] RN Care Manager Direct Dial: [phone] Fax: [phone]

## 2024-06-01 ENCOUNTER — Other Ambulatory Visit: Payer: Self-pay | Admitting: Internal Medicine

## 2024-06-01 ENCOUNTER — Telehealth: Payer: Self-pay

## 2024-06-01 DIAGNOSIS — Z1231 Encounter for screening mammogram for malignant neoplasm of breast: Secondary | ICD-10-CM

## 2024-06-01 NOTE — Telephone Encounter (Signed)
 Copied from CRM #[id number]. Topic: Clinical - Prescription Issue >> Jun 01, 2024  2:17 PM Benton O wrote: Reason for RMF:[id number] is calling because doctor  wanted me to call and tell him when i checkedon my prescriptions and he said to let him know if they were tooo expensive and they are . He said he would see about a alternative or do something different please reach out to patient for further assistance  [phone]

## 2024-06-01 NOTE — Telephone Encounter (Signed)
 Pharmacy team please check for cheaper alternative for Anoro Ellipta.

## 2024-06-02 ENCOUNTER — Other Ambulatory Visit (HOSPITAL_COMMUNITY): Payer: Self-pay

## 2024-06-02 NOTE — Telephone Encounter (Signed)
 Referral sent to [facility] coordinator at [facility].  Imaging powershared and shared decision sent.

## 2024-06-02 NOTE — Telephone Encounter (Signed)
 Patient advised. Patient will call her insurance to set up payment plan, for deductible. She will follow up with pharmacy. NFN.

## 2024-06-03 ENCOUNTER — Encounter: Payer: Self-pay | Admitting: Internal Medicine

## 2024-06-03 ENCOUNTER — Ambulatory Visit (INDEPENDENT_AMBULATORY_CARE_PROVIDER_SITE_OTHER): Admitting: Internal Medicine

## 2024-06-03 VITALS — BP 136/80 | HR 86 | Resp 16 | Ht 65.0 in | Wt 116.4 lb

## 2024-06-03 DIAGNOSIS — I10 Essential (primary) hypertension: Secondary | ICD-10-CM

## 2024-06-03 DIAGNOSIS — F1021 Alcohol dependence, in remission: Secondary | ICD-10-CM

## 2024-06-03 DIAGNOSIS — I5030 Unspecified diastolic (congestive) heart failure: Secondary | ICD-10-CM | POA: Diagnosis not present

## 2024-06-03 DIAGNOSIS — E7800 Pure hypercholesterolemia, unspecified: Secondary | ICD-10-CM | POA: Diagnosis not present

## 2024-06-03 DIAGNOSIS — J439 Emphysema, unspecified: Secondary | ICD-10-CM

## 2024-06-03 DIAGNOSIS — K219 Gastro-esophageal reflux disease without esophagitis: Secondary | ICD-10-CM

## 2024-06-03 DIAGNOSIS — E039 Hypothyroidism, unspecified: Secondary | ICD-10-CM

## 2024-06-03 DIAGNOSIS — D649 Anemia, unspecified: Secondary | ICD-10-CM

## 2024-06-03 DIAGNOSIS — N184 Chronic kidney disease, stage 4 (severe): Secondary | ICD-10-CM

## 2024-06-03 DIAGNOSIS — I480 Paroxysmal atrial fibrillation: Secondary | ICD-10-CM

## 2024-06-03 DIAGNOSIS — I4891 Unspecified atrial fibrillation: Secondary | ICD-10-CM

## 2024-06-03 DIAGNOSIS — I420 Dilated cardiomyopathy: Secondary | ICD-10-CM

## 2024-06-03 LAB — LIPID PANEL
Cholesterol: 152 mg/dL (ref 0–200)
HDL: 62.4 mg/dL (ref >39.00)
LDL Cholesterol: 69 mg/dL (ref 0–99)
NonHDL: 89.46
Total CHOL/HDL Ratio: 2
Triglycerides: 103 mg/dL (ref 0.0–149.0)
VLDL: 20.6 mg/dL (ref 0.0–40.0)

## 2024-06-03 LAB — BASIC METABOLIC PANEL WITH GFR
BUN: 50 mg/dL — ABNORMAL HIGH (ref 6–23)
CO2: 29 meq/L (ref 19–32)
Calcium: 9.1 mg/dL (ref 8.4–10.5)
Chloride: 100 meq/L (ref 96–112)
Creatinine, Ser: 2.91 mg/dL — ABNORMAL HIGH (ref 0.40–1.20)
GFR: 16.01 mL/min — ABNORMAL LOW (ref >60.00)
Glucose, Bld: 80 mg/dL (ref 70–99)
Potassium: 4.7 meq/L (ref 3.5–5.1)
Sodium: 138 meq/L (ref 135–145)

## 2024-06-03 LAB — HEPATIC FUNCTION PANEL
ALT: 12 U/L (ref 0–35)
AST: 15 U/L (ref 0–37)
Albumin: 4.7 g/dL (ref 3.5–5.2)
Alkaline Phosphatase: 77 U/L (ref 39–117)
Bilirubin, Direct: 0.1 mg/dL (ref 0.0–0.3)
Total Bilirubin: 0.4 mg/dL (ref 0.2–1.2)
Total Protein: 6.8 g/dL (ref 6.0–8.3)

## 2024-06-03 NOTE — Progress Notes (Signed)
 Subjective:    Patient ID: Dawn Sherman, female    DOB: [birth year], 68 y.o.   MRN: [id number]  Patient here for  Chief Complaint  Patient presents with   Medical Management of Chronic Issues    HPI Here for a scheduled follow up. Saw Dr Isadora - f/u COPD - recommended to continue albuterol . Recommended starting anoro. Attempted watchman implant 05/27/24. Aborted due to poor stability with each tug test. Recommended referral to [facility] for consideration of amulet implant  Had f/u with Dr Melanee 05/10/24 - did not require IV iron. F/u 6 months. Follow up cardiology 04/12/24 - f/u afib. Maintaining SR. On eilquis. No chest pain. No sob reported. No cough or congestion. No abdominal pain or bowel change reported. Overall feeling better and doing better.    Past Medical History:  Diagnosis Date   B12 deficiency    Cardiomyopathy (HCC)    a. 09/2022 Echo: EF 35-40%; b. 09/2022 MV: apical defect w/ mild peri-infarct ischemia. EF 43%.   Chronic HFrEF (heart failure with reduced ejection fraction) (HCC)    a. 01/2020 Echo: EF 30-35%; b. 07/2020 Echo: EF 50-55%; c. 06/2022 Echo: EF 50-55%; d. 09/2022 Echo: EF 35-40%, globh HK, mod reduced RV fxn, sev BAE, mod MR.   CKD (chronic kidney disease), stage IV (HCC)    COPD (chronic obstructive pulmonary disease) (HCC)    Depression    Endometriosis    Hypertension    Mixed hyperlipidemia    Moderate mitral regurgitation    Normocytic anemia    PAF (paroxysmal atrial fibrillation) (HCC)    a. CHA2DS2VASc = 4-->eliquis  2.5 bid.   Paroxymsal Atrial flutter (HCC)    Tobacco abuse    Past Surgical History:  Procedure Laterality Date   ABDOMINAL HYSTERECTOMY     ATRIAL FIBRILLATION ABLATION N/A 08/04/2023   Procedure: ATRIAL FIBRILLATION ABLATION;  Surgeon: Cindie Ole DASEN, MD;  [facility];  Service: Cardiovascular;  Laterality: N/A;   BREAST BIOPSY Right 2015   neg-  FIBROADENOMA   CENTRAL LINE INSERTION  11/15/2022   Procedure:  CENTRAL LINE INSERTION;  Surgeon: Mady Bruckner, MD;  Location: [facility] INVASIVE CV LAB;  Service: Cardiovascular;;   COLONOSCOPY WITH PROPOFOL  N/A 11/24/2022   Procedure: COLONOSCOPY WITH PROPOFOL ;  Surgeon: Maryruth Ole DASEN, MD;  Location: [facility] ENDOSCOPY;  Service: Endoscopy;  Laterality: N/A;   COLONOSCOPY WITH PROPOFOL  N/A 11/23/2022   Procedure: COLONOSCOPY WITH PROPOFOL ;  Surgeon: Maryruth Ole DASEN, MD;  Location: [facility] ENDOSCOPY;  Service: Endoscopy;  Laterality: N/A;   ESOPHAGOGASTRODUODENOSCOPY (EGD) WITH PROPOFOL  N/A 11/23/2022   Procedure: ESOPHAGOGASTRODUODENOSCOPY (EGD) WITH PROPOFOL ;  Surgeon: Maryruth Ole DASEN, MD;  Location: [facility] ENDOSCOPY;  Service: Endoscopy;  Laterality: N/A;   LEFT ATRIAL APPENDAGE OCCLUSION N/A 05/27/2024   Procedure: LEFT ATRIAL APPENDAGE OCCLUSION;  Surgeon: Cindie Ole DASEN, MD;  Location: [facility];  Service: Cardiovascular;  Laterality: N/A;   RIGHT HEART CATH N/A 11/15/2022   Procedure: RIGHT HEART CATH;  Surgeon: Mady Bruckner, MD;  Location: [facility] INVASIVE CV LAB;  Service: Cardiovascular;  Laterality: N/A;   RIGHT/LEFT HEART CATH AND CORONARY ANGIOGRAPHY N/A 11/25/2022   Procedure: RIGHT/LEFT HEART CATH AND CORONARY ANGIOGRAPHY;  Surgeon: Darron Deatrice LABOR, MD;  Location: [facility] INVASIVE CV LAB;  Service: Cardiovascular;  Laterality: N/A;   TAH/RSO  1999   secondary to bleeding and endometriosis (Dr Trula)   TRANSESOPHAGEAL ECHOCARDIOGRAM (CATH LAB) N/A 08/04/2023   Procedure: TRANSESOPHAGEAL ECHOCARDIOGRAM;  Surgeon: Cindie Ole DASEN, MD;  Location: [facility];  Service: Cardiovascular;  Laterality: N/A;   TRANSESOPHAGEAL ECHOCARDIOGRAM (CATH LAB) N/A 05/27/2024   Procedure: TRANSESOPHAGEAL ECHOCARDIOGRAM;  Surgeon: Cindie Ole DASEN, MD;  Location: [facility] INVASIVE CV LAB;  Service: Cardiovascular;  Laterality: N/A;   Family History  Problem Relation Age of Onset   Hypercholesterolemia Mother    Rheum arthritis Father    Rheum  arthritis Daughter    Fibromyalgia Daughter    Breast cancer Neg Hx    Colon cancer Neg Hx    Social History   Socioeconomic History   Marital status: Married    Spouse name: Not on file   Number of children: Not on file   Years of education: Not on file   Highest education level: 12th grade  Occupational History   Not on file  Tobacco Use   Smoking status: Former    Current packs/day: 0.00    Average packs/day: 0.5 packs/day for 49.0 years (24.5 ttl pk-yrs)    Types: Cigarettes    Start date: 21    Quit date: 2024    Years since quitting: 1.8   Smokeless tobacco: Never   Tobacco comments:    Patient quit smoking recently \\Quit  6 months ago  Vaping Use   Vaping status: Never Used  Substance and Sexual Activity   Alcohol use: Not Currently    Alcohol/week: 0.0 standard drinks of alcohol    Comment: occasional   Drug use: No   Sexual activity: Not Currently  Other Topics Concern   Not on file  Social History Narrative   Lives in [place] with her husband.  She is retired from [employer]	[place].  She does not routinely exercise.   Social Drivers of Corporate investment banker Strain: Low Risk  (12/15/2023)   Overall Financial Resource Strain (CARDIA)    Difficulty of Paying Living Expenses: Not hard at all  Food Insecurity: No Food Insecurity (05/31/2024)   Hunger Vital Sign    Worried About Running Out of Food in the Last Year: Never true    Ran Out of Food in the Last Year: Never true  Transportation Needs: No Transportation Needs (05/31/2024)   PRAPARE - Administrator, Civil Service (Medical): No    Lack of Transportation (Non-Medical): No  Physical Activity: Inactive (12/15/2023)   Exercise Vital Sign    Days of Exercise per Week: 0 days    Minutes of Exercise per Session: 0 min  Stress: No Stress Concern Present (12/15/2023)   [employer] of Occupational Health - Occupational Stress Questionnaire    Feeling of Stress : Only a little   Social Connections: Socially Integrated (02/24/2024)   Social Connection and Isolation Panel    Frequency of Communication with Friends and Family: Three times a week    Frequency of Social Gatherings with Friends and Family: Three times a week    Attends Religious Services: 1 to 4 times per year    Active Member of Clubs or Organizations: Yes    Attends Banker Meetings: 1 to 4 times per year    Marital Status: Married     Review of Systems  Constitutional:  Negative for appetite change and unexpected weight change.  HENT:  Negative for congestion and sinus pressure.   Respiratory:  Negative for cough, chest tightness and shortness of breath.   Cardiovascular:  Negative for chest pain, palpitations and leg swelling.  Gastrointestinal:  Negative for abdominal pain, diarrhea, nausea and  vomiting.  Genitourinary:  Negative for difficulty urinating and dysuria.  Musculoskeletal:  Negative for joint swelling and myalgias.  Skin:  Negative for color change and rash.  Neurological:  Negative for dizziness and headaches.  Psychiatric/Behavioral:  Negative for agitation and dysphoric mood.        Objective:     BP 136/80   Pulse 86   Resp 16   Ht 5' 5 (1.651 m)   Wt 116 lb 6.4 oz (52.8 kg)   SpO2 98%   BMI 19.37 kg/m  Wt Readings from Last 3 Encounters:  06/03/24 116 lb 6.4 oz (52.8 kg)  05/31/24 114 lb 3.2 oz (51.8 kg)  05/27/24 115 lb (52.2 kg)    Physical Exam Vitals reviewed.  Constitutional:      General: She is not in acute distress.    Appearance: Normal appearance.  HENT:     Head: Normocephalic and atraumatic.     Right Ear: External ear normal.     Left Ear: External ear normal.     Mouth/Throat:     Pharynx: No oropharyngeal exudate or posterior oropharyngeal erythema.  Eyes:     General: No scleral icterus.       Right eye: No discharge.        Left eye: No discharge.     Conjunctiva/sclera: Conjunctivae normal.  Neck:     Thyroid : No  thyromegaly.  Cardiovascular:     Rate and Rhythm: Normal rate and regular rhythm.  Pulmonary:     Effort: No respiratory distress.     Breath sounds: Normal breath sounds. No wheezing.  Abdominal:     General: Bowel sounds are normal.     Palpations: Abdomen is soft.     Tenderness: There is no abdominal tenderness.  Musculoskeletal:        General: No swelling or tenderness.     Cervical back: Neck supple. No tenderness.  Lymphadenopathy:     Cervical: No cervical adenopathy.  Skin:    Findings: No erythema or rash.  Neurological:     Mental Status: She is alert.  Psychiatric:        Mood and Affect: Mood normal.        Behavior: Behavior normal.         Outpatient Encounter Medications as of 06/03/2024  Medication Sig   acetaminophen  (TYLENOL ) 325 MG tablet Take 650 mg by mouth every 6 (six) hours as needed for mild pain (pain score 1-3) or moderate pain (pain score 4-6) (pain.).   albuterol  (VENTOLIN  HFA) 108 (90 Base) MCG/ACT inhaler Inhale 2 puffs into the lungs every 6 (six) hours as needed for wheezing or shortness of breath.   apixaban  (ELIQUIS ) 2.5 MG TABS tablet Take 1 tablet (2.5 mg total) by mouth 2 (two) times daily.   atorvastatin  (LIPITOR) 40 MG tablet TAKE ONE TABLET BY MOUTH EVERY DAY   buPROPion  (WELLBUTRIN  XL) 150 MG 24 hr tablet Take 1 tablet (150 mg total) by mouth daily.   calcitRIOL  (ROCALTROL ) 0.25 MCG capsule Take 0.25 mcg by mouth daily.   calcium  carbonate (OS-CAL - DOSED IN MG OF ELEMENTAL CALCIUM ) 1250 (500 Ca) MG tablet Take 1 tablet (500 mg of elemental calcium  total) by mouth 3 (three) times daily with meals.   carvedilol  (COREG ) 3.125 MG tablet Take 1 tablet (3.125 mg total) by mouth 2 (two) times daily.   FARXIGA  10 MG TABS tablet Take 1 tablet (10 mg total) by mouth daily.   hydrALAZINE  (APRESOLINE ) 25  MG tablet Take 1 tablet (25 mg total) by mouth 3 (three) times daily.   isosorbide  mononitrate (IMDUR ) 30 MG 24 hr tablet Take 1 tablet (30 mg  total) by mouth daily.   levothyroxine  (SYNTHROID ) 75 MCG tablet TAKE ONE TABLET BY MOUTH DAILY BEFORE BREAKFAST   loratadine  (CLARITIN ) 10 MG tablet Take 10 mg by mouth daily.   omeprazole  (PRILOSEC) 20 MG capsule Take 1 capsule (20 mg total) by mouth daily.   torsemide  (DEMADEX ) 20 MG tablet Take 20-60 mg by mouth See admin instructions. Tale 60 mg in the morning and 20 mg in the afternoon if notice swelling   umeclidinium-vilanterol (ANORO ELLIPTA) 62.5-25 MCG/ACT AEPB Inhale 1 puff into the lungs daily. (Patient not taking: Reported on 05/31/2024)   venlafaxine  XR (EFFEXOR -XR) 150 MG 24 hr capsule TAKE ONE CAPSULE BY MOUTH EVERY DAY   No facility-administered encounter medications on file as of 06/03/2024.     Lab Results  Component Value Date   WBC 8.8 05/10/2024   HGB 14.4 05/10/2024   HCT 44.3 05/10/2024   PLT 148 (L) 05/10/2024   GLUCOSE 80 06/03/2024   CHOL 152 06/03/2024   TRIG 103.0 06/03/2024   HDL 62.40 06/03/2024   LDLDIRECT 53.3 10/14/2012   LDLCALC 69 06/03/2024   ALT 12 06/03/2024   AST 15 06/03/2024   NA 138 06/03/2024   K 4.7 06/03/2024   CL 100 06/03/2024   CREATININE 2.91 (H) 06/03/2024   BUN 50 (H) 06/03/2024   CO2 29 06/03/2024   TSH 1.38 02/19/2024   INR 2.7 04/28/2024   HGBA1C 4.8 01/02/2023    ECHO TEE Result Date: 05/27/2024    TRANSESOPHOGEAL ECHO REPORT   Patient Name:   NIKITHA MODE Date of Exam: 05/27/2024 Medical Rec #:  [id number]     Height:       65.0 in Accession #:    [id number]    Weight:       115.0 lb Date of Birth:  18-Dec-1955    BSA:          1.564 m Patient Age:    67 years      BP:           156/83 mmHg Patient Gender: F             HR:           76 bpm. Exam Location:  Inpatient Procedure: Transesophageal Echo, 3D Echo, Cardiac Doppler and Color Doppler            (Both Spectral and Color Flow Doppler were utilized during            procedure). Indications:     Watchman  History:         Patient has prior history of Echocardiogram  examinations, most                  recent 11/27/2023. Cardiomyopathy and CHF, COPD; Risk                  Factors:Hypertension.  Sonographer:     Jayson Gaskins Referring Phys:  [id number] OLE ONEIDA HOLTS Diagnosing Phys: Stanly Leavens MD PROCEDURE: After discussion of the risks and benefits of a TEE, an informed consent was obtained from the patient. The transesophogeal probe was passed without difficulty through the esophogus of the patient. Sedation performed by different physician. The patient developed no complications during the procedure.  IMPRESSIONS  1. Left ventricular ejection fraction, by estimation, is  45 to 50%. Left ventricular ejection fraction by 3D volume is 46 %. The left ventricle has mildly decreased function. The left ventricle demonstrates regional wall motion abnormalities (see scoring diagram/findings for description).  2. Right ventricular systolic function is normal. The right ventricular size is normal.  3. Posterior view of the appendage shows evidence of a circumflex anomaly. Consistent with retroaortic circumflex branch suppling some RCA territory. This correlates to last heart catheterization (limited views). Left atrial size was mildly dilated. No left atrial/left atrial appendage thrombus was detected.  4. The mitral valve is normal in structure. Mild mitral valve regurgitation.  5. The aortic valve is tricuspid. Aortic valve regurgitation is not visualized.  6. 3D performed of the LAA and demonstrates Prior to procedure, multi-lobar cauliflower appendage. Maximal dimensions on 2D and 3D 19 mm- a more proximal landing zone was planned. a 27 mm Watchman FLX was deployed multiple times. Movement during each tug test. Inability to successfully deploy Watchman FLX. Residiual shunting was left to right. no pericardial effusion. FINDINGS  Left Ventricle: Left ventricular ejection fraction, by estimation, is 45 to 50%. Left ventricular ejection fraction by 3D volume is 46 %. The left  ventricle has mildly decreased function. The left ventricle demonstrates regional wall motion abnormalities. The left ventricular internal cavity size was normal in size. Right Ventricle: The right ventricular size is normal. No increase in right ventricular wall thickness. Right ventricular systolic function is normal. Left Atrium: Posterior view of the appendage shows evidence of a circumflex anomaly. Consistent with retroaortic circumflex branch suppling some RCA territory. This correlates to last heart catheterization (limited views). Left atrial size was mildly dilated. No left atrial/left atrial appendage thrombus was detected. Right Atrium: Right atrial size was normal in size. Pericardium: There is no evidence of pericardial effusion. Mitral Valve: The mitral valve is normal in structure. Mild mitral valve regurgitation. Tricuspid Valve: The tricuspid valve is grossly normal. Tricuspid valve regurgitation is trivial. Aortic Valve: The aortic valve is tricuspid. Aortic valve regurgitation is not visualized. Pulmonic Valve: The pulmonic valve was grossly normal. Pulmonic valve regurgitation is trivial. Aorta: The aortic root and ascending aorta are structurally normal, with no evidence of dilitation. IAS/Shunts: No atrial level shunt detected by color flow Doppler. Additional Comments: 3D was performed not requiring image post processing on an independent workstation and was abnormal.  3D Volume EF LV 3D EF:    Left ventricular ejection fraction by 3D volume is 46              %.  3D Volume EF LV 3D EF:    46.20 % LV 3D EDV:   50300.00 mm LV 3D ESV:   27100.00 mm LV 3D SV:    23300.00 mm  3D Volume EF: 3D EF:        46 % Stanly Leavens MD Electronically signed by Stanly Leavens MD Signature Date/Time: 05/27/2024/11:01:05 AM    Final    EP STUDY Result Date: 05/27/2024 CONCLUSIONS: 1. Aborted implantation of a WATCHMAN left atrial appendage occlusive device due to device instability 2. TEE  demonstrating no LAA thrombus 3. No early apparent complications. Post Implant Anticoagulation Strategy: Continue Eliquis  for now. Plan for outpatient referral for Amulet implant.       Assessment & Plan:  Primary hypertension -     Basic metabolic panel with GFR  Hypercholesterolemia Assessment & Plan: Continue Lipitor.  Low-cholesterol diet.  Follow lipid panel liver function testing. No changes today.  Lab Results  Component Value  Date   CHOL 152 06/03/2024   HDL 62.40 06/03/2024   LDLCALC 69 06/03/2024   LDLDIRECT 53.3 10/14/2012   TRIG 103.0 06/03/2024   CHOLHDL 2 06/03/2024     Orders: -     Hepatic function panel -     Lipid panel  Heart failure with preserved ejection fraction (HFpEF), unspecified HF chronicity (HCC) Assessment & Plan: ECHO 11/27/23 - EF 55 - 60%. LVH. Denies increased sob. Overall stable. Continue hydralazine  and demadex . No evidence of volume overload on exam.    Alcohol dependence in remission [facility]) Assessment & Plan: No recent alcohol intake. Doing well. Follow.    Anemia, unspecified type Assessment & Plan: Had f/u with Dr Melanee 05/10/24 - did not require IV iron. F/u 6 months.   Atrial fibrillation, unspecified type [facility]) Assessment & Plan: Attempted watchman implant 05/27/24. Aborted due to poor stability with each tug test. Recommended referral to [facility] for consideration of amulet implant. Continue eliquis .    Chronic obstructive pulmonary disease with emphysema, unspecified emphysema type (HCC) Assessment & Plan: No increased cough or congestion. Continue anoro. Has albuterol  to use prn. Follow.    CKD (chronic kidney disease), stage IV (HCC) Assessment & Plan: Last GFR 16 06/03/2024. Continue f/u with nephrology.    Dilated cardiomyopathy [facility]) Assessment & Plan: Attempted watchman implant 05/27/24. Aborted due to poor stability with each tug test. Recommended referral to [facility] for consideration of amulet implant.  Conitnues on eliquis . No evidence of volume overload. No chest pain or sob.    Essential hypertension Assessment & Plan: Continues on hydralazine , Imdur  and torsemide  as directed.  Also on Farxiga .  Blood pressure as outlined.  Follow pressures.  Follow metabolic panel.    Gastroesophageal reflux disease without esophagitis Assessment & Plan: No upper symptoms reported. Continues prilosec.    Hypothyroidism, unspecified type Assessment & Plan: Continue thyroid  replacement. Follow tsh.    Paroxysmal atrial fibrillation [facility]) Assessment & Plan: Attempted watchman implant 05/27/24. Aborted due to poor stability with each tug test. Recommended referral to [facility] for consideration of amulet implant. Continue eliquis .       Allena [place], MD

## 2024-06-04 ENCOUNTER — Ambulatory Visit: Payer: Self-pay | Admitting: Internal Medicine

## 2024-06-10 ENCOUNTER — Other Ambulatory Visit: Payer: Self-pay

## 2024-06-13 ENCOUNTER — Encounter: Payer: Self-pay | Admitting: Internal Medicine

## 2024-06-13 NOTE — Assessment & Plan Note (Signed)
No upper symptoms reported.  Continues prilosec.

## 2024-06-13 NOTE — Assessment & Plan Note (Signed)
 Continue Lipitor.  Low-cholesterol diet.  Follow lipid panel liver function testing. No changes today.  Lab Results  Component Value Date   CHOL 152 06/03/2024   HDL 62.40 06/03/2024   LDLCALC 69 06/03/2024   LDLDIRECT 53.3 10/14/2012   TRIG 103.0 06/03/2024   CHOLHDL 2 06/03/2024

## 2024-06-13 NOTE — Assessment & Plan Note (Signed)
 No recent alcohol intake. Doing well. Follow.

## 2024-06-13 NOTE — Assessment & Plan Note (Signed)
 Last GFR 16 06/03/2024. Continue f/u with nephrology.

## 2024-06-13 NOTE — Assessment & Plan Note (Signed)
 Continues on hydralazine , Imdur  and torsemide  as directed.  Also on Farxiga .  Blood pressure as outlined.  Follow pressures.  Follow metabolic panel.

## 2024-06-13 NOTE — Assessment & Plan Note (Signed)
 Attempted watchman implant 05/27/24. Aborted due to poor stability with each tug test. Recommended referral to [facility] for consideration of amulet implant. Continue eliquis .

## 2024-06-13 NOTE — Assessment & Plan Note (Signed)
 ECHO 11/27/23 - EF 55 - 60%. LVH. Denies increased sob. Overall stable. Continue hydralazine  and demadex . No evidence of volume overload on exam.

## 2024-06-13 NOTE — Assessment & Plan Note (Signed)
 Attempted watchman implant 05/27/24. Aborted due to poor stability with each tug test. Recommended referral to [facility] for consideration of amulet implant. Conitnues on eliquis . No evidence of volume overload. No chest pain or sob.

## 2024-06-13 NOTE — Assessment & Plan Note (Signed)
Continue thyroid replacement.  Follow tsh.   

## 2024-06-13 NOTE — Assessment & Plan Note (Signed)
 Had f/u with Dr Melanee 05/10/24 - did not require IV iron. F/u 6 months.

## 2024-06-13 NOTE — Assessment & Plan Note (Signed)
 No increased cough or congestion. Continue anoro. Has albuterol  to use prn. Follow.

## 2024-06-14 MED ORDER — TORSEMIDE 20 MG PO TABS: ORAL_TABLET | ORAL | 3 refills | Status: DC

## 2024-06-14 NOTE — Telephone Encounter (Signed)
 Pt's pharmacy is requesting a refill on medication torsemide . Would Dr. Gollan like to refill this medication? Please address

## 2024-06-22 ENCOUNTER — Encounter: Payer: Self-pay | Admitting: Cardiology

## 2024-06-22 NOTE — Telephone Encounter (Signed)
 Called and spoke with the patient. After much discussion, she wishes to have consult at [facility] instead of [facility]. She will call back if she changes her mind.

## 2024-06-24 ENCOUNTER — Telehealth: Payer: Self-pay | Admitting: Pharmacist

## 2024-06-24 ENCOUNTER — Telehealth: Payer: Self-pay

## 2024-06-24 DIAGNOSIS — I480 Paroxysmal atrial fibrillation: Secondary | ICD-10-CM

## 2024-06-24 MED ORDER — APIXABAN 2.5 MG PO TABS: 2.5000 mg | ORAL_TABLET | Freq: Two times a day (BID) | ORAL | 0 refills | Status: AC

## 2024-06-24 NOTE — Telephone Encounter (Signed)
 PAP: Patient assistance application for Farxiga through [employer] ([employer]) has been mailed to pt's home address on file. Provider portion of application will be faxed to provider's office.

## 2024-06-24 NOTE — Telephone Encounter (Signed)
Med reconcile.

## 2024-06-25 ENCOUNTER — Encounter

## 2024-06-29 ENCOUNTER — Other Ambulatory Visit

## 2024-06-29 ENCOUNTER — Ambulatory Visit: Admitting: Oncology

## 2024-06-29 NOTE — Telephone Encounter (Signed)
 Signed and placed in box.

## 2024-06-29 NOTE — Telephone Encounter (Signed)
 Placed in folder

## 2024-06-30 NOTE — Telephone Encounter (Signed)
 Form faxed and sent to scan

## 2024-07-05 NOTE — Telephone Encounter (Signed)
 PAP: Application for Marcelline Deist has been submitted to [employer] ([employer]), via fax

## 2024-07-07 NOTE — Telephone Encounter (Signed)
 PAP: Patient assistance application for Farxiga has been approved by PAP Companies: [employer] from 08/26/2024 to 08/25/2025. Medication should be delivered to PAP Delivery: Home. For further shipping updates, please contact [employer] ([employer]) at [phone]. Patient ID is: approval letter in media

## 2024-07-08 ENCOUNTER — Ambulatory Visit: Admitting: Cardiology

## 2024-07-15 ENCOUNTER — Telehealth: Payer: Self-pay

## 2024-07-15 NOTE — Telephone Encounter (Signed)
 Spoke with patient. She is scheduled for Amulet 12/5 at [facility]. Wished her lucky and told her to call [facility] if she needs anything from [facility].

## 2024-07-21 ENCOUNTER — Ambulatory Visit
Admission: RE | Admit: 2024-07-21 | Discharge: 2024-07-21 | Disposition: A | Discharge status: Home / Self Care | Source: Ambulatory Visit | Attending: Internal Medicine | Admitting: Internal Medicine

## 2024-07-21 DIAGNOSIS — Z1231 Encounter for screening mammogram for malignant neoplasm of breast: Secondary | ICD-10-CM | POA: Insufficient documentation

## 2024-08-01 ENCOUNTER — Other Ambulatory Visit: Payer: Self-pay | Admitting: Internal Medicine

## 2024-08-11 ENCOUNTER — Telehealth: Payer: Self-pay

## 2024-08-11 NOTE — Telephone Encounter (Signed)
 The patient called today asking for Eliquis  samples.    She was given enough samples of Eliquis  on 05/03/2024 to get her through her LAAO procedure then 45 days afterward (to Plavix transition). Her 05/27/2024 Watchman procedure, however, was aborted DOS due to device instability and she was referred to [facility] for Amulet.  She continued taking Eliquis  and now has 1 week left of her meds.  Reiterated to the patient that more samples cannot be given and she would likely be switched back to Coumadin . Instructed her to call [facility] and they may be able to help with samples, and at the very least they would need to know that her meds may be changing if they cannot help her. She is scheduled for an Amulet with them 09/10/2024.  Will route to [employer] for follow-up.

## 2024-08-13 NOTE — Telephone Encounter (Signed)
 Spoke with patient. She reached out to [facility] yesterday and is awaiting for a call back. She does have an active prescription for Eliquis  at her pharmacy as she spoke with them. Encouraged her to continue to reach out to [facility]. Advised patient to call [facility] if she needs a transition to Coumadin .   Patient is aware she shouldn't miss a dose of AC as it could postpone her Amulet procedure.   She will call back if she needs assistance. She was grateful for the call.

## 2024-08-21 ENCOUNTER — Inpatient Hospital Stay
Admission: EM | Admit: 2024-08-21 | Discharge: 2024-08-25 | DRG: 193 | Disposition: A | Discharge status: Home / Self Care | Attending: Internal Medicine | Admitting: Internal Medicine

## 2024-08-21 ENCOUNTER — Other Ambulatory Visit: Payer: Self-pay

## 2024-08-21 ENCOUNTER — Emergency Department

## 2024-08-21 DIAGNOSIS — Z7951 Long term (current) use of inhaled steroids: Secondary | ICD-10-CM

## 2024-08-21 DIAGNOSIS — Z83438 Family history of other disorder of lipoprotein metabolism and other lipidemia: Secondary | ICD-10-CM

## 2024-08-21 DIAGNOSIS — I4819 Other persistent atrial fibrillation: Secondary | ICD-10-CM | POA: Diagnosis present

## 2024-08-21 DIAGNOSIS — E039 Hypothyroidism, unspecified: Secondary | ICD-10-CM | POA: Diagnosis present

## 2024-08-21 DIAGNOSIS — Z888 Allergy status to other drugs, medicaments and biological substances status: Secondary | ICD-10-CM

## 2024-08-21 DIAGNOSIS — J101 Influenza due to other identified influenza virus with other respiratory manifestations: Principal | ICD-10-CM | POA: Diagnosis present

## 2024-08-21 DIAGNOSIS — Z95818 Presence of other cardiac implants and grafts: Secondary | ICD-10-CM

## 2024-08-21 DIAGNOSIS — Z881 Allergy status to other antibiotic agents status: Secondary | ICD-10-CM

## 2024-08-21 DIAGNOSIS — J441 Chronic obstructive pulmonary disease with (acute) exacerbation: Secondary | ICD-10-CM | POA: Diagnosis not present

## 2024-08-21 DIAGNOSIS — I130 Hypertensive heart and chronic kidney disease with heart failure and stage 1 through stage 4 chronic kidney disease, or unspecified chronic kidney disease: Secondary | ICD-10-CM | POA: Diagnosis present

## 2024-08-21 DIAGNOSIS — I4892 Unspecified atrial flutter: Secondary | ICD-10-CM | POA: Diagnosis present

## 2024-08-21 DIAGNOSIS — J9601 Acute respiratory failure with hypoxia: Secondary | ICD-10-CM | POA: Diagnosis present

## 2024-08-21 DIAGNOSIS — E782 Mixed hyperlipidemia: Secondary | ICD-10-CM | POA: Diagnosis present

## 2024-08-21 DIAGNOSIS — I429 Cardiomyopathy, unspecified: Secondary | ICD-10-CM | POA: Diagnosis present

## 2024-08-21 DIAGNOSIS — J9621 Acute and chronic respiratory failure with hypoxia: Secondary | ICD-10-CM | POA: Diagnosis present

## 2024-08-21 DIAGNOSIS — Z79890 Hormone replacement therapy (postmenopausal): Secondary | ICD-10-CM

## 2024-08-21 DIAGNOSIS — Z79899 Other long term (current) drug therapy: Secondary | ICD-10-CM

## 2024-08-21 DIAGNOSIS — Z885 Allergy status to narcotic agent status: Secondary | ICD-10-CM

## 2024-08-21 DIAGNOSIS — Z87891 Personal history of nicotine dependence: Secondary | ICD-10-CM

## 2024-08-21 DIAGNOSIS — Z1152 Encounter for screening for COVID-19: Secondary | ICD-10-CM

## 2024-08-21 DIAGNOSIS — Z7901 Long term (current) use of anticoagulants: Secondary | ICD-10-CM

## 2024-08-21 DIAGNOSIS — I5022 Chronic systolic (congestive) heart failure: Secondary | ICD-10-CM | POA: Diagnosis present

## 2024-08-21 DIAGNOSIS — Z880 Allergy status to penicillin: Secondary | ICD-10-CM

## 2024-08-21 DIAGNOSIS — N184 Chronic kidney disease, stage 4 (severe): Secondary | ICD-10-CM | POA: Diagnosis present

## 2024-08-21 DIAGNOSIS — Z8261 Family history of arthritis: Secondary | ICD-10-CM

## 2024-08-21 DIAGNOSIS — I340 Nonrheumatic mitral (valve) insufficiency: Secondary | ICD-10-CM | POA: Diagnosis present

## 2024-08-21 LAB — CBC
HCT: 47.1 % — ABNORMAL HIGH (ref 36.0–46.0)
Hemoglobin: 15 g/dL (ref 12.0–15.0)
MCH: 30.4 pg (ref 26.0–34.0)
MCHC: 31.8 g/dL (ref 30.0–36.0)
MCV: 95.5 fL (ref 80.0–100.0)
Platelets: 127 K/uL — ABNORMAL LOW (ref 150–400)
RBC: 4.93 MIL/uL (ref 3.87–5.11)
RDW: 14 % (ref 11.5–15.5)
WBC: 9 K/uL (ref 4.0–10.5)
nRBC: 0 % (ref 0.0–0.2)

## 2024-08-21 LAB — BASIC METABOLIC PANEL WITH GFR
Anion gap: 16 — ABNORMAL HIGH (ref 5–15)
BUN: 33 mg/dL — ABNORMAL HIGH (ref 8–23)
CO2: 23 mmol/L (ref 22–32)
Calcium: 9.1 mg/dL (ref 8.9–10.3)
Chloride: 101 mmol/L (ref 98–111)
Creatinine, Ser: 2.84 mg/dL — ABNORMAL HIGH (ref 0.44–1.00)
GFR, Estimated: 17 mL/min — ABNORMAL LOW
Glucose, Bld: 118 mg/dL — ABNORMAL HIGH (ref 70–99)
Potassium: 4.9 mmol/L (ref 3.5–5.1)
Sodium: 139 mmol/L (ref 135–145)

## 2024-08-21 LAB — RESP PANEL BY RT-PCR (RSV, FLU A&B, COVID)  RVPGX2
Influenza A by PCR: POSITIVE — AB
Influenza B by PCR: NEGATIVE
Resp Syncytial Virus by PCR: NEGATIVE
SARS Coronavirus 2 by RT PCR: NEGATIVE

## 2024-08-21 LAB — PROTIME-INR
INR: 0.9 (ref 0.8–1.2)
Prothrombin Time: 12.3 s (ref 11.4–15.2)

## 2024-08-21 MED ORDER — ACETAMINOPHEN 325 MG PO TABS
650.0000 mg | ORAL_TABLET | Freq: Four times a day (QID) | ORAL | Status: DC | PRN
  Administered 2024-08-22 – 2024-08-25 (×3): 650 mg via ORAL
  Filled 2024-08-21: qty 2

## 2024-08-21 MED ORDER — IPRATROPIUM-ALBUTEROL 0.5-2.5 (3) MG/3ML IN SOLN
3.0000 mL | Freq: Four times a day (QID) | RESPIRATORY_TRACT | Status: DC
  Administered 2024-08-21 – 2024-08-22 (×4): 3 mL via RESPIRATORY_TRACT
  Filled 2024-08-21 (×4): qty 3

## 2024-08-21 MED ORDER — PANTOPRAZOLE SODIUM 40 MG PO TBEC
40.0000 mg | DELAYED_RELEASE_TABLET | Freq: Every day | ORAL | Status: DC
  Administered 2024-08-21 – 2024-08-25 (×5): 40 mg via ORAL
  Filled 2024-08-21 (×2): qty 1

## 2024-08-21 MED ORDER — ALBUTEROL SULFATE (2.5 MG/3ML) 0.083% IN NEBU: 2.5000 mg | INHALATION_SOLUTION | RESPIRATORY_TRACT | Status: DC | PRN

## 2024-08-21 MED ORDER — HYDRALAZINE HCL 25 MG PO TABS
25.0000 mg | ORAL_TABLET | Freq: Three times a day (TID) | ORAL | Status: DC
  Administered 2024-08-21 – 2024-08-25 (×12): 25 mg via ORAL
  Filled 2024-08-21 (×4): qty 1

## 2024-08-21 MED ORDER — VENLAFAXINE HCL ER 75 MG PO CP24
150.0000 mg | ORAL_CAPSULE | Freq: Every day | ORAL | Status: DC
  Administered 2024-08-22 – 2024-08-25 (×4): 150 mg via ORAL
  Filled 2024-08-21: qty 1

## 2024-08-21 MED ORDER — ONDANSETRON HCL 4 MG PO TABS: 4.0000 mg | ORAL_TABLET | Freq: Four times a day (QID) | ORAL | Status: DC | PRN

## 2024-08-21 MED ORDER — METHYLPREDNISOLONE SODIUM SUCC 40 MG IJ SOLR
40.0000 mg | Freq: Two times a day (BID) | INTRAMUSCULAR | Status: AC
  Administered 2024-08-21 – 2024-08-22 (×2): 40 mg via INTRAVENOUS
  Filled 2024-08-21 (×2): qty 1

## 2024-08-21 MED ORDER — APIXABAN 2.5 MG PO TABS
2.5000 mg | ORAL_TABLET | Freq: Two times a day (BID) | ORAL | Status: DC
  Administered 2024-08-21 – 2024-08-25 (×8): 2.5 mg via ORAL
  Filled 2024-08-21 (×3): qty 1

## 2024-08-21 MED ORDER — LORATADINE 10 MG PO TABS
10.0000 mg | ORAL_TABLET | Freq: Every day | ORAL | Status: DC
  Administered 2024-08-22 – 2024-08-25 (×4): 10 mg via ORAL
  Filled 2024-08-21: qty 1

## 2024-08-21 MED ORDER — LEVOTHYROXINE SODIUM 50 MCG PO TABS
75.0000 ug | ORAL_TABLET | Freq: Every day | ORAL | Status: DC
  Administered 2024-08-22 – 2024-08-25 (×4): 75 ug via ORAL
  Filled 2024-08-21: qty 2

## 2024-08-21 MED ORDER — ISOSORBIDE MONONITRATE ER 30 MG PO TB24
30.0000 mg | ORAL_TABLET | Freq: Every day | ORAL | Status: DC
  Administered 2024-08-21 – 2024-08-25 (×5): 30 mg via ORAL
  Filled 2024-08-21 (×2): qty 1

## 2024-08-21 MED ORDER — MAGNESIUM SULFATE 2 GM/50ML IV SOLN
2.0000 g | Freq: Once | INTRAVENOUS | Status: AC
  Administered 2024-08-21: 2 g via INTRAVENOUS
  Filled 2024-08-21: qty 50

## 2024-08-21 MED ORDER — IPRATROPIUM-ALBUTEROL 0.5-2.5 (3) MG/3ML IN SOLN
3.0000 mL | Freq: Once | RESPIRATORY_TRACT | Status: AC
  Administered 2024-08-21: 3 mL via RESPIRATORY_TRACT
  Filled 2024-08-21: qty 3

## 2024-08-21 MED ORDER — ONDANSETRON HCL 4 MG/2ML IJ SOLN: 4.0000 mg | Freq: Four times a day (QID) | INTRAMUSCULAR | Status: DC | PRN

## 2024-08-21 MED ORDER — METHYLPREDNISOLONE SODIUM SUCC 125 MG IJ SOLR
125.0000 mg | Freq: Once | INTRAMUSCULAR | Status: AC
  Administered 2024-08-21: 125 mg via INTRAVENOUS
  Filled 2024-08-21: qty 2

## 2024-08-21 MED ORDER — DAPAGLIFLOZIN PROPANEDIOL 10 MG PO TABS
10.0000 mg | ORAL_TABLET | Freq: Every day | ORAL | Status: DC
  Administered 2024-08-22 – 2024-08-25 (×4): 10 mg via ORAL
  Filled 2024-08-21: qty 1

## 2024-08-21 MED ORDER — ACETAMINOPHEN 650 MG RE SUPP: 650.0000 mg | Freq: Four times a day (QID) | RECTAL | Status: DC | PRN

## 2024-08-21 MED ORDER — CARVEDILOL 6.25 MG PO TABS
3.1250 mg | ORAL_TABLET | Freq: Two times a day (BID) | ORAL | Status: DC
  Administered 2024-08-21 – 2024-08-25 (×8): 3.125 mg via ORAL
  Filled 2024-08-21 (×3): qty 1

## 2024-08-21 MED ORDER — FLUTICASONE FUROATE-VILANTEROL 100-25 MCG/ACT IN AEPB
1.0000 | INHALATION_SPRAY | Freq: Every day | RESPIRATORY_TRACT | Status: DC
  Filled 2024-08-21: qty 28

## 2024-08-21 MED ORDER — PREDNISONE 20 MG PO TABS: 40.0000 mg | ORAL_TABLET | Freq: Every day | ORAL | Status: DC

## 2024-08-21 MED ORDER — ATORVASTATIN CALCIUM 20 MG PO TABS
40.0000 mg | ORAL_TABLET | Freq: Every day | ORAL | Status: DC
  Administered 2024-08-22 – 2024-08-25 (×4): 40 mg via ORAL
  Filled 2024-08-21: qty 2

## 2024-08-21 MED ORDER — BUPROPION HCL ER (XL) 150 MG PO TB24
150.0000 mg | ORAL_TABLET | Freq: Every day | ORAL | Status: DC
  Administered 2024-08-22 – 2024-08-25 (×4): 150 mg via ORAL
  Filled 2024-08-21 (×2): qty 1

## 2024-08-21 NOTE — ED Triage Notes (Signed)
 Pt to ED Via [facility] from home for SOB, pt in triage O2 at 85%, placed on 4L O2. Pt stating started two days ago along with productive cough, greenish-yellow sputum, denies CP and diarrhea. Pt denies any pain.

## 2024-08-21 NOTE — ED Provider Notes (Signed)
 "  [facility] Provider Note    Event Date/Time   First MD Initiated Contact with Patient 08/21/24 1431     (approximate)   History   Shortness of Breath   HPI  Dawn Sherman is a 68 y.o. female with history of COPD, CKD, CHF who presents with complaints of shortness of breath.  She occasionally uses nighttime oxygen at home but this is atypical for her.  She reports feeling somewhat fatigued and short of breath which is developed over the last 2 days     Physical Exam   Triage Vital Signs: ED Triage Vitals  Encounter Vitals Group     BP 08/21/24 1319 (!) 146/93     Girls Systolic BP Percentile --      Girls Diastolic BP Percentile --      Boys Systolic BP Percentile --      Boys Diastolic BP Percentile --      Pulse Rate 08/21/24 1319 (!) 117     Resp 08/21/24 1319 20     Temp 08/21/24 1319 99.8 F (37.7 C)     Temp Source 08/21/24 1319 Oral     SpO2 08/21/24 1319 91 %     Weight 08/21/24 1320 52.2 kg (115 lb)     Height 08/21/24 1320 1.524 m (5')     Head Circumference --      Peak Flow --      Pain Score 08/21/24 1320 0     Pain Loc --      Pain Education --      Exclude from Growth Chart --     Most recent vital signs: Vitals:   08/21/24 1319 08/21/24 1332  BP: (!) 146/93   Pulse: (!) 117   Resp: 20   Temp: 99.8 F (37.7 C)   SpO2: 91% 92%     General: Awake, no distress.  CV:  Good peripheral perfusion.  Resp:  Tachypnea, poor air movement, scattered wheezing Abd:  No distention.  Other:     ED Results / Procedures / Treatments   Labs (all labs ordered are listed, but only abnormal results are displayed) Labs Reviewed  RESP PANEL BY RT-PCR (RSV, FLU A&B, COVID)  RVPGX2 - Abnormal; Notable for the following components:      Result Value   Influenza A by PCR POSITIVE (*)    All other components within normal limits  BASIC METABOLIC PANEL WITH GFR - Abnormal; Notable for the following components:   Glucose, Bld 118 (*)     BUN 33 (*)    Creatinine, Ser 2.84 (*)    GFR, Estimated 17 (*)    Anion gap 16 (*)    All other components within normal limits  CBC - Abnormal; Notable for the following components:   HCT 47.1 (*)    Platelets 127 (*)    All other components within normal limits  PROTIME-INR     EKG  ED ECG REPORT I, Lamar Price, the attending physician, personally viewed and interpreted this ECG.  Date: 08/21/2024  Rhythm: Sinus tachycardia QRS Axis: normal Intervals: normal ST/T Wave abnormalities: normal Narrative Interpretation: no evidence of acute ischemia    RADIOLOGY Chest x-ray with likely atelectasis    PROCEDURES:  Critical Care performed: yes  CRITICAL CARE Performed by: Lamar Price   Total critical care time: 30 minutes  Critical care time was exclusive of separately billable procedures and treating other patients.  Critical care was necessary to  treat or prevent imminent or life-threatening deterioration.  Critical care was time spent personally by me on the following activities: development of treatment plan with patient and/or surrogate as well as nursing, discussions with consultants, evaluation of patient's response to treatment, examination of patient, obtaining history from patient or surrogate, ordering and performing treatments and interventions, ordering and review of laboratory studies, ordering and review of radiographic studies, pulse oximetry and re-evaluation of patient's condition.   Procedures   MEDICATIONS ORDERED IN ED: Medications  magnesium  sulfate IVPB 2 g 50 mL (2 g Intravenous New Bag/Given 08/21/24 1459)  methylPREDNISolone  sodium succinate (SOLU-MEDROL ) 125 mg/2 mL injection 125 mg (125 mg Intravenous Given 08/21/24 1458)  ipratropium-albuterol  (DUONEB) 0.5-2.5 (3) MG/3ML nebulizer solution 3 mL (3 mLs Nebulization Given 08/21/24 1459)  ipratropium-albuterol  (DUONEB) 0.5-2.5 (3) MG/3ML nebulizer solution 3 mL (3 mLs  Nebulization Given 08/21/24 1459)     IMPRESSION / MDM / ASSESSMENT AND PLAN / ED COURSE  I reviewed the triage vital signs and the nursing notes. Patient's presentation is most consistent with acute presentation with potential threat to life or bodily function.  Patient presents with shortness of breath as detailed above, differential includes COPD, pneumonia, influenza, CHF  She is requiring 4 L nasal cannula to keep her oxygen saturations above 90%  Will treat with IV Solu-Medrol , IV magnesium , DuoNebs as I suspect COPD exacerbation as the cause of her symptoms  Influenza A PCR positive  Have discussed with the hospitalist for admission        FINAL CLINICAL IMPRESSION(S) / ED DIAGNOSES   Final diagnoses:  COPD exacerbation (HCC)  Influenza A  Acute respiratory failure with hypoxia (HCC)     Rx / DC Orders   ED Discharge Orders     None        Note:  This document was prepared using Dragon voice recognition software and may include unintentional dictation errors.   Arlander Charleston, MD 08/21/24 [phone]  "

## 2024-08-21 NOTE — ED Triage Notes (Signed)
 First nurse note: Pt to ED via [facility] from home. Pt reports SOB since last night and worse this morning at 7am. Pt used inhaler PTA  EMS reports 91% RA and given 2 duonebs for wheezing with improvement to 96?%. Pt also reports productive cough x2 days.   HR 111 96% RA 169/974 CBG 149 99.7 oral

## 2024-08-21 NOTE — H&P (Signed)
 " History and Physical    Dawn Sherman FMW:[id number] DOB: April 09, 1956 DOA: 08/21/2024  PCP: Glendia Shad, MD (Confirm with patient/family/NH records and if not entered, this has to be entered at [facility] point of entry) Patient coming from: Home  I have personally briefly reviewed patient's old medical records in [facility]  Chief Complaint: Cough, wheezing, SOB  HPI: Dawn Sherman is a 68 y.o. female with medical history significant of COPD Gold stage III, persistent A-fib status post Watchman procedure, on Eliquis , HTN, cardiomyopathy with chronic HFrEF, CKD stage IV, anxiety/depression, presented with worsening of cough wheezing shortness of breath. Symptoms started 3 days ago, when patient started develop sore throat dry cough and wheezing increase gradually worsening of exertional dyspnea.  Denies any chest pain, no fever or chills.  Overnight she had to use several rounds of albuterol  for wheezing and shortness of breath without significant improvement and decided to come to ED.   ED Course: Temperature 99.8 pulse rate 117, blood pressure 146/90 O2 saturation 90% on 4 L.  Chest x-ray negative for acute infiltrates,, creatinine 2.8 compared to baseline 2.9-3.0, bicarb 23, WBC 9.0 hemoglobin 15  Patient was given IV Solu-Medrol , 2 rounds of DuoNebs but continued to have significant dyspnea and wheezing.  Review of Systems: As per HPI otherwise 14 point review of systems negative.    Past Medical History:  Diagnosis Date   B12 deficiency    Cardiomyopathy (HCC)    a. 09/2022 Echo: EF 35-40%; b. 09/2022 MV: apical defect w/ mild peri-infarct ischemia. EF 43%.   Chronic HFrEF (heart failure with reduced ejection fraction) (HCC)    a. 01/2020 Echo: EF 30-35%; b. 07/2020 Echo: EF 50-55%; c. 06/2022 Echo: EF 50-55%; d. 09/2022 Echo: EF 35-40%, globh HK, mod reduced RV fxn, sev BAE, mod MR.   CKD (chronic kidney disease), stage IV (HCC)    COPD (chronic obstructive pulmonary disease)  (HCC)    Depression    Endometriosis    Hypertension    Mixed hyperlipidemia    Moderate mitral regurgitation    Normocytic anemia    PAF (paroxysmal atrial fibrillation) (HCC)    a. CHA2DS2VASc = 4-->eliquis  2.5 bid.   Paroxymsal Atrial flutter (HCC)    Tobacco abuse     Past Surgical History:  Procedure Laterality Date   ABDOMINAL HYSTERECTOMY     ATRIAL FIBRILLATION ABLATION N/A 08/04/2023   Procedure: ATRIAL FIBRILLATION ABLATION;  Surgeon: Cindie Ole DASEN, MD;  Location: [facility] INVASIVE CV LAB;  Service: Cardiovascular;  Laterality: N/A;   BREAST BIOPSY Right 2015   neg-  FIBROADENOMA   CENTRAL LINE INSERTION  11/15/2022   Procedure: CENTRAL LINE INSERTION;  Surgeon: Mady Bruckner, MD;  Location: [facility] INVASIVE CV LAB;  Service: Cardiovascular;;   COLONOSCOPY WITH PROPOFOL  N/A 11/24/2022   Procedure: COLONOSCOPY WITH PROPOFOL ;  Surgeon: Maryruth Ole DASEN, MD;  Location: [facility] ENDOSCOPY;  Service: Endoscopy;  Laterality: N/A;   COLONOSCOPY WITH PROPOFOL  N/A 11/23/2022   Procedure: COLONOSCOPY WITH PROPOFOL ;  Surgeon: Maryruth Ole DASEN, MD;  Location: [facility] ENDOSCOPY;  Service: Endoscopy;  Laterality: N/A;   ESOPHAGOGASTRODUODENOSCOPY (EGD) WITH PROPOFOL  N/A 11/23/2022   Procedure: ESOPHAGOGASTRODUODENOSCOPY (EGD) WITH PROPOFOL ;  Surgeon: Maryruth Ole DASEN, MD;  Location: [facility] ENDOSCOPY;  Service: Endoscopy;  Laterality: N/A;   LEFT ATRIAL APPENDAGE OCCLUSION N/A 05/27/2024   Procedure: LEFT ATRIAL APPENDAGE OCCLUSION;  Surgeon: Cindie Ole DASEN, MD;  Location: [facility] INVASIVE CV LAB;  Service: Cardiovascular;  Laterality: N/A;   RIGHT HEART CATH  N/A 11/15/2022   Procedure: RIGHT HEART CATH;  Surgeon: Mady Bruckner, MD;  Location: [facility] INVASIVE CV LAB;  Service: Cardiovascular;  Laterality: N/A;   RIGHT/LEFT HEART CATH AND CORONARY ANGIOGRAPHY N/A 11/25/2022   Procedure: RIGHT/LEFT HEART CATH AND CORONARY ANGIOGRAPHY;  Surgeon: Darron Deatrice LABOR, MD;  Location: [facility] INVASIVE CV LAB;   Service: Cardiovascular;  Laterality: N/A;   TAH/RSO  1999   secondary to bleeding and endometriosis (Dr Trula)   TRANSESOPHAGEAL ECHOCARDIOGRAM (CATH LAB) N/A 08/04/2023   Procedure: TRANSESOPHAGEAL ECHOCARDIOGRAM;  Surgeon: Cindie Ole DASEN, MD;  Location[facility] INVASIVE CV LAB;  Service: Cardiovascular;  Laterality: N/A;   TRANSESOPHAGEAL ECHOCARDIOGRAM (CATH LAB) N/A 05/27/2024   Procedure: TRANSESOPHAGEAL ECHOCARDIOGRAM;  Surgeon: Cindie Ole DASEN, MD;  Location: [facility] INVASIVE CV LAB;  Service: Cardiovascular;  Laterality: N/A;     reports that she quit smoking about 1 years ago. Her smoking use included cigarettes. She started smoking about 51 years ago. She has a 24.5 pack-year smoking history. She has never used smokeless tobacco. She reports that she does not currently use alcohol. She reports that she does not use drugs.  Allergies[1]  Family History  Problem Relation Age of Onset   Hypercholesterolemia Mother    Rheum arthritis Father    Rheum arthritis Daughter    Fibromyalgia Daughter    Breast cancer Neg Hx    Colon cancer Neg Hx    Prior to Admission medications  Medication Sig Start Date End Date Taking? Authorizing Provider  acetaminophen  (TYLENOL ) 325 MG tablet Take 650 mg by mouth every 6 (six) hours as needed for mild pain (pain score 1-3) or moderate pain (pain score 4-6) (pain.).    [provider]  albuterol  (VENTOLIN  HFA) 108 (90 Base) MCG/ACT inhaler Inhale 2 puffs into the lungs every 6 (six) hours as needed for wheezing or shortness of breath. 02/19/24   Glendia Shad, MD  apixaban  (ELIQUIS ) 2.5 MG TABS tablet Take 1 tablet (2.5 mg total) by mouth 2 (two) times daily. 04/29/24   Wonda Sharper, MD  atorvastatin  (LIPITOR) 40 MG tablet TAKE ONE TABLET BY MOUTH EVERY DAY 04/05/24   Glendia Shad, MD  buPROPion  (WELLBUTRIN  XL) 150 MG 24 hr tablet Take 1 tablet (150 mg total) by mouth daily. 02/19/24   Glendia Shad, MD  calcitRIOL  (ROCALTROL ) 0.25 MCG  capsule Take 0.25 mcg by mouth daily.    [provider]  calcium  carbonate (OS-CAL - DOSED IN MG OF ELEMENTAL CALCIUM ) 1250 (500 Ca) MG tablet Take 1 tablet (500 mg of elemental calcium  total) by mouth 3 (three) times daily with meals. 12/14/21   Patel, Sona, MD  carvedilol  (COREG ) 3.125 MG tablet Take 1 tablet (3.125 mg total) by mouth 2 (two) times daily. 12/31/23 05/31/24  Riddle, Suzann, NP  FARXIGA  10 MG TABS tablet Take 1 tablet (10 mg total) by mouth daily. 03/24/24   Glendia Shad, MD  hydrALAZINE  (APRESOLINE ) 25 MG tablet Take 1 tablet (25 mg total) by mouth 3 (three) times daily. 05/06/24   Rolan Ezra RAMAN, MD  isosorbide  mononitrate (IMDUR ) 30 MG 24 hr tablet Take 1 tablet (30 mg total) by mouth daily. 05/06/24 08/04/24  Rolan Ezra RAMAN, MD  levothyroxine  (SYNTHROID ) 75 MCG tablet TAKE ONE TABLET BY MOUTH DAILY BEFORE BREAKFAST 08/02/24   Glendia Shad, MD  loratadine  (CLARITIN ) 10 MG tablet Take 10 mg by mouth daily.    [provider]  omeprazole  (PRILOSEC) 20 MG capsule Take 1 capsule (20 mg total) by mouth daily.  09/18/23   Riddle, Suzann, NP  torsemide  (DEMADEX ) 20 MG tablet Take 60 mg by mouth in the morning and Take 20 mg by mouth in the afternoon if notice swelling 06/14/24   Gollan, Timothy J, MD  umeclidinium-vilanterol (ANORO ELLIPTA ) 62.5-25 MCG/ACT AEPB Inhale 1 puff into the lungs daily. Patient not taking: Reported on 05/31/2024 05/31/24   Isadora Hose, MD  venlafaxine  XR (EFFEXOR -XR) 150 MG 24 hr capsule TAKE ONE CAPSULE BY MOUTH EVERY DAY 05/03/24   Glendia Shad, MD    Physical Exam: Vitals:   08/21/24 1319 08/21/24 1320 08/21/24 1332  BP: (!) 146/93    Pulse: (!) 117    Resp: 20    Temp: 99.8 F (37.7 C)    TempSrc: Oral    SpO2: 91%  92%  Weight:  52.2 kg   Height:  5' (1.524 m)     Constitutional: NAD, calm, comfortable Vitals:   08/21/24 1319 08/21/24 1320 08/21/24 1332  BP: (!) 146/93    Pulse: (!) 117    Resp: 20    Temp: 99.8 F  (37.7 C)    TempSrc: Oral    SpO2: 91%  92%  Weight:  52.2 kg   Height:  5' (1.524 m)    Eyes: PERRL, lids and conjunctivae normal ENMT: Mucous membranes are moist. Posterior pharynx clear of any exudate or lesions.Normal dentition.  Neck: normal, supple, no masses, no thyromegaly Respiratory: Diminished breathing sound bilaterally, diffused wheezing, no crackles, increasing respiratory effort. No accessory muscle use.  Cardiovascular: Regular rate and rhythm, no murmurs / rubs / gallops. No extremity edema. 2+ pedal pulses. No carotid bruits.  Abdomen: no tenderness, no masses palpated. No hepatosplenomegaly. Bowel sounds positive.  Musculoskeletal: no clubbing / cyanosis. No joint deformity upper and lower extremities. Good ROM, no contractures. Normal muscle tone.  Skin: no rashes, lesions, ulcers. No induration Neurologic: CN 2-12 grossly intact. Sensation intact, DTR normal. Strength 5/5 in all 4.  Psychiatric: Normal judgment and insight. Alert and oriented x 3. Normal mood.     Labs on Admission: I have personally reviewed following labs and imaging studies  CBC: Recent Labs  Lab 08/21/24 1323  WBC 9.0  HGB 15.0  HCT 47.1*  MCV 95.5  PLT 127*   Basic Metabolic Panel: Recent Labs  Lab 08/21/24 1323  NA 139  K 4.9  CL 101  CO2 23  GLUCOSE 118*  BUN 33*  CREATININE 2.84*  CALCIUM  9.1   GFR: Estimated Creatinine Clearance: 13.4 mL/min (A) (by C-G formula based on SCr of 2.84 mg/dL (H)). Liver Function Tests: No results for input(s): AST, ALT, ALKPHOS, BILITOT, PROT, ALBUMIN  in the last 168 hours. No results for input(s): LIPASE, AMYLASE in the last 168 hours. No results for input(s): AMMONIA in the last 168 hours. Coagulation Profile: Recent Labs  Lab 08/21/24 1323  INR 0.9   Cardiac Enzymes: No results for input(s): CKTOTAL, CKMB, CKMBINDEX, TROPONINI in the last 168 hours. BNP (last 3 results) No results for input(s): PROBNP  in the last 8760 hours. HbA1C: No results for input(s): HGBA1C in the last 72 hours. CBG: No results for input(s): GLUCAP in the last 168 hours. Lipid Profile: No results for input(s): CHOL, HDL, LDLCALC, TRIG, CHOLHDL, LDLDIRECT in the last 72 hours. Thyroid  Function Tests: No results for input(s): TSH, T4TOTAL, FREET4, T3FREE, THYROIDAB in the last 72 hours. Anemia Panel: No results for input(s): VITAMINB12, FOLATE, FERRITIN, TIBC, IRON, RETICCTPCT in the last 72 hours. Urine analysis:  Component Value Date/Time   COLORURINE YELLOW (A) 12/07/2023 1523   APPEARANCEUR CLEAR (A) 12/07/2023 1523   LABSPEC 1.013 12/07/2023 1523   PHURINE 5.0 12/07/2023 1523   GLUCOSEU NEGATIVE 12/07/2023 1523   GLUCOSEU NEGATIVE 01/22/2017 1052   HGBUR NEGATIVE 12/07/2023 1523   BILIRUBINUR NEGATIVE 12/07/2023 1523   KETONESUR NEGATIVE 12/07/2023 1523   PROTEINUR 100 (A) 12/07/2023 1523   UROBILINOGEN 0.2 01/22/2017 1052   NITRITE NEGATIVE 12/07/2023 1523   LEUKOCYTESUR SMALL (A) 12/07/2023 1523    Radiological Exams on Admission: DG Chest 2 View Result Date: 08/21/2024 CLINICAL DATA:  Shortness of breath since last night weight coarsened this morning. EXAM: CHEST - 2 VIEW COMPARISON:  02/23/2024 FINDINGS: Cardiomediastinal silhouette and pulmonary vasculature are within normal limits. Interval development of patchy opacity at the RIGHT lung base which is favored to be atelectasis. Lungs otherwise clear. Scoliotic curvature of the thoracolumbar spine again seen. Multiple BILATERAL healed rib fractures also again seen. IMPRESSION: Patchy opacities at the RIGHT lung base are favored to be atelectasis rather than pneumonia. Electronically Signed   By: Aliene Lloyd M.D.   On: 08/21/2024 14:41    EKG: Independently reviewed.  Sinus tachycardia, no acute ST changes.  Assessment/Plan Principal Problem:   COPD exacerbation (HCC) Active Problems:   Acute hypoxemic  respiratory failure (HCC)   Influenza A  (please populate well all problems here in Problem List. (For example, if patient is on BP meds at home and you resume or decide to hold them, it is a problem that needs to be her. Same for CAD, COPD, HLD and so on)  Influenza A infection -Out of window for Tamiflu  Acute hypoxic respiratory failure  Acute COPD exacerbation COPD Gold stage III - IV Solu-Medrol  - Continue ICS and LABA - Bronchodilators - Incentive spirometry - Also ordered BiPAP as needed - Ambulatory pulse ox tomorrow and Home O2 evaluation  Chronic HFrEF - Euvolemic - Continue Coreg , Imdur , hydralazine  - Hold off diuresis  PAF status post Watchman procedure - In sinus rhythm - Continue Eliquis   CKD stage IV - Euvolemic, creatinine stable - Monitor off diuresis  Hypothyroidism - Continue Synthroid   DVT prophylaxis: Eliquis  Code Status: Full code Family Communication: Husband at bedside Disposition Plan: Expect less than 2 midnight hospital stay Consults called: None Admission status: PCU observation   Cort ONEIDA Mana MD [facility] Pager [phone]  08/21/2024, 3:28 PM       [1]  Allergies Allergen Reactions   Sulfate Rash   Codeine Sulfate Nausea Only   Benicar [Olmesartan]     Talked with patient February 10, 2020, intolerance is unclear, tried several medications around that time and one of them gave her a rash but she is not clear which 1.   Amoxicillin Rash   Clindamycin/Lincomycin Rash   Entresto  [Sacubitril -Valsartan ] Other (See Comments)    hyperkalemia   Morphine And Codeine Rash   Penicillins Rash   "

## 2024-08-22 DIAGNOSIS — I5022 Chronic systolic (congestive) heart failure: Secondary | ICD-10-CM | POA: Diagnosis present

## 2024-08-22 DIAGNOSIS — Z95818 Presence of other cardiac implants and grafts: Secondary | ICD-10-CM | POA: Diagnosis not present

## 2024-08-22 DIAGNOSIS — Z7901 Long term (current) use of anticoagulants: Secondary | ICD-10-CM | POA: Diagnosis not present

## 2024-08-22 DIAGNOSIS — Z888 Allergy status to other drugs, medicaments and biological substances status: Secondary | ICD-10-CM | POA: Diagnosis not present

## 2024-08-22 DIAGNOSIS — E039 Hypothyroidism, unspecified: Secondary | ICD-10-CM | POA: Diagnosis present

## 2024-08-22 DIAGNOSIS — Z1152 Encounter for screening for COVID-19: Secondary | ICD-10-CM | POA: Diagnosis not present

## 2024-08-22 DIAGNOSIS — Z79899 Other long term (current) drug therapy: Secondary | ICD-10-CM | POA: Diagnosis not present

## 2024-08-22 DIAGNOSIS — J9621 Acute and chronic respiratory failure with hypoxia: Secondary | ICD-10-CM | POA: Diagnosis present

## 2024-08-22 DIAGNOSIS — I340 Nonrheumatic mitral (valve) insufficiency: Secondary | ICD-10-CM | POA: Diagnosis present

## 2024-08-22 DIAGNOSIS — N184 Chronic kidney disease, stage 4 (severe): Secondary | ICD-10-CM | POA: Diagnosis present

## 2024-08-22 DIAGNOSIS — Z87891 Personal history of nicotine dependence: Secondary | ICD-10-CM | POA: Diagnosis not present

## 2024-08-22 DIAGNOSIS — E782 Mixed hyperlipidemia: Secondary | ICD-10-CM | POA: Diagnosis present

## 2024-08-22 DIAGNOSIS — I429 Cardiomyopathy, unspecified: Secondary | ICD-10-CM | POA: Diagnosis present

## 2024-08-22 DIAGNOSIS — Z8261 Family history of arthritis: Secondary | ICD-10-CM | POA: Diagnosis not present

## 2024-08-22 DIAGNOSIS — J101 Influenza due to other identified influenza virus with other respiratory manifestations: Secondary | ICD-10-CM | POA: Diagnosis present

## 2024-08-22 DIAGNOSIS — Z79890 Hormone replacement therapy (postmenopausal): Secondary | ICD-10-CM | POA: Diagnosis not present

## 2024-08-22 DIAGNOSIS — I4819 Other persistent atrial fibrillation: Secondary | ICD-10-CM | POA: Diagnosis present

## 2024-08-22 DIAGNOSIS — Z880 Allergy status to penicillin: Secondary | ICD-10-CM | POA: Diagnosis not present

## 2024-08-22 DIAGNOSIS — I4892 Unspecified atrial flutter: Secondary | ICD-10-CM | POA: Diagnosis present

## 2024-08-22 DIAGNOSIS — Z885 Allergy status to narcotic agent status: Secondary | ICD-10-CM | POA: Diagnosis not present

## 2024-08-22 DIAGNOSIS — J441 Chronic obstructive pulmonary disease with (acute) exacerbation: Secondary | ICD-10-CM

## 2024-08-22 DIAGNOSIS — Z83438 Family history of other disorder of lipoprotein metabolism and other lipidemia: Secondary | ICD-10-CM | POA: Diagnosis not present

## 2024-08-22 DIAGNOSIS — Z7951 Long term (current) use of inhaled steroids: Secondary | ICD-10-CM | POA: Diagnosis not present

## 2024-08-22 DIAGNOSIS — I130 Hypertensive heart and chronic kidney disease with heart failure and stage 1 through stage 4 chronic kidney disease, or unspecified chronic kidney disease: Secondary | ICD-10-CM | POA: Diagnosis present

## 2024-08-22 MED ORDER — TORSEMIDE 20 MG PO TABS
40.0000 mg | ORAL_TABLET | Freq: Every day | ORAL | Status: DC
  Administered 2024-08-22 – 2024-08-25 (×4): 40 mg via ORAL
  Filled 2024-08-22: qty 2

## 2024-08-22 MED ORDER — ARFORMOTEROL TARTRATE 15 MCG/2ML IN NEBU
15.0000 ug | INHALATION_SOLUTION | Freq: Two times a day (BID) | RESPIRATORY_TRACT | Status: DC
  Administered 2024-08-22 – 2024-08-25 (×7): 15 ug via RESPIRATORY_TRACT
  Filled 2024-08-22 (×3): qty 2

## 2024-08-22 MED ORDER — IPRATROPIUM-ALBUTEROL 0.5-2.5 (3) MG/3ML IN SOLN
3.0000 mL | RESPIRATORY_TRACT | Status: DC
  Administered 2024-08-22: 3 mL via RESPIRATORY_TRACT
  Filled 2024-08-22: qty 3

## 2024-08-22 MED ORDER — IPRATROPIUM-ALBUTEROL 0.5-2.5 (3) MG/3ML IN SOLN
3.0000 mL | Freq: Two times a day (BID) | RESPIRATORY_TRACT | Status: DC
  Administered 2024-08-22 – 2024-08-23 (×2): 3 mL via RESPIRATORY_TRACT
  Filled 2024-08-22: qty 3

## 2024-08-22 MED ORDER — BUDESONIDE 0.25 MG/2ML IN SUSP
0.2500 mg | Freq: Two times a day (BID) | RESPIRATORY_TRACT | Status: DC
  Administered 2024-08-22 – 2024-08-25 (×7): 0.25 mg via RESPIRATORY_TRACT
  Filled 2024-08-22 (×2): qty 2

## 2024-08-22 MED ORDER — AMIODARONE HCL 200 MG PO TABS
200.0000 mg | ORAL_TABLET | Freq: Every day | ORAL | Status: DC
  Administered 2024-08-22 – 2024-08-25 (×4): 200 mg via ORAL
  Filled 2024-08-22: qty 1

## 2024-08-22 MED ORDER — METHYLPREDNISOLONE SODIUM SUCC 40 MG IJ SOLR
40.0000 mg | Freq: Two times a day (BID) | INTRAMUSCULAR | Status: DC
  Administered 2024-08-22 – 2024-08-24 (×4): 40 mg via INTRAVENOUS
  Filled 2024-08-22: qty 1

## 2024-08-22 MED ORDER — GUAIFENESIN-DM 100-10 MG/5ML PO SYRP
5.0000 mL | ORAL_SOLUTION | ORAL | Status: DC | PRN
  Administered 2024-08-22 – 2024-08-25 (×5): 5 mL via ORAL
  Filled 2024-08-22: qty 10

## 2024-08-22 MED ORDER — AMLODIPINE BESYLATE 10 MG PO TABS
10.0000 mg | ORAL_TABLET | Freq: Every day | ORAL | Status: DC
  Administered 2024-08-22 – 2024-08-25 (×4): 10 mg via ORAL
  Filled 2024-08-22: qty 1

## 2024-08-22 NOTE — Plan of Care (Signed)
   Problem: Education: Goal: Knowledge of disease or condition will improve Outcome: Progressing Goal: Knowledge of the prescribed therapeutic regimen will improve Outcome: Progressing Goal: Individualized Educational Video(s) Outcome: Progressing   Problem: Activity: Goal: Ability to tolerate increased activity will improve Outcome: Progressing Goal: Will verbalize the importance of balancing activity with adequate rest periods Outcome: Progressing   Problem: Respiratory: Goal: Ability to maintain a clear airway will improve Outcome: Progressing Goal: Levels of oxygenation will improve Outcome: Progressing Goal: Ability to maintain adequate ventilation will improve Outcome: Progressing   Problem: Education: Goal: Knowledge of General Education information will improve Description: Including pain rating scale, medication(s)/side effects and non-pharmacologic comfort measures Outcome: Progressing   Problem: Health Behavior/Discharge Planning: Goal: Ability to manage health-related needs will improve Outcome: Progressing   Problem: Clinical Measurements: Goal: Ability to maintain clinical measurements within normal limits will improve Outcome: Progressing Goal: Will remain free from infection Outcome: Progressing Goal: Diagnostic test results will improve Outcome: Progressing Goal: Respiratory complications will improve Outcome: Progressing Goal: Cardiovascular complication will be avoided Outcome: Progressing   Problem: Activity: Goal: Risk for activity intolerance will decrease Outcome: Progressing   Problem: Nutrition: Goal: Adequate nutrition will be maintained Outcome: Progressing   Problem: Coping: Goal: Level of anxiety will decrease Outcome: Progressing   Problem: Elimination: Goal: Will not experience complications related to bowel motility Outcome: Progressing Goal: Will not experience complications related to urinary retention Outcome: Progressing    Problem: Pain Managment: Goal: General experience of comfort will improve and/or be controlled Outcome: Progressing   Problem: Safety: Goal: Ability to remain free from injury will improve Outcome: Progressing   Problem: Skin Integrity: Goal: Risk for impaired skin integrity will decrease Outcome: Progressing

## 2024-08-22 NOTE — Progress Notes (Signed)
 " PROGRESS NOTE    Dawn Sherman  FMW:[id number] DOB: [birth year] DOA: 08/21/2024 PCP: Glendia Shad, MD    Brief Narrative:  68 y.o. female with medical history significant of COPD Gold stage III, persistent A-fib status post Watchman procedure, on Eliquis , HTN, cardiomyopathy with chronic HFrEF, CKD stage IV, anxiety/depression, presented with worsening of cough wheezing shortness of breath. Symptoms started 3 days ago, when patient started develop sore throat dry cough and wheezing increase gradually worsening of exertional dyspnea.  Denies any chest pain, no fever or chills.  Overnight she had to use several rounds of albuterol  for wheezing and shortness of breath without significant improvement and decided to come to ED.   Assessment & Plan:   Principal Problem:   COPD exacerbation (HCC) Active Problems:   Acute hypoxemic respiratory failure (HCC)   Influenza A  Influenza A infection Out of window for Tamiflu Plan: - Supportive care - Droplet iso   Acute hypoxic respiratory failure  Acute COPD exacerbation COPD Gold stage III Still wheezing and SOB Plan: Recommend inpatient status Continue IV Solu-Medrol  40 mg every 12 hours Scheduled bronchodilators Twice daily Brovana , twice daily Pulmicort  Incentive spirometry Anticipate discharge 12/29   Chronic HFrEF Appears euvolemic.  No evidence of exacerbation Plan: Can resume home diuretic Continue Coreg , Imdur , hydralazine    PAF status post Watchman procedure - In sinus rhythm - Continue Eliquis  -Continue amiodarone    CKD stage IV - Euvolemic, creatinine stable - Resume diuresis   Hypothyroidism - Continue Synthroid     DVT prophylaxis: Eliquis  Code Status: Full Family Communication: None Disposition Plan: Status is: Observation The patient will require care spanning > 2 midnights and should be moved to inpatient because: COPD exacerbation on IV steroid and scheduled bronchodilators   Level of care:  Med-Surg  Consultants:  None  Procedures:  None  Antimicrobials: None   Subjective: Seen and examined.  Reports continued shortness of breath and cough.  Objective: Vitals:   08/22/24 0900 08/22/24 1000 08/22/24 1208 08/22/24 1258  BP: (!) 141/87  (!) 153/82   Pulse: (!) 102  98   Resp: 18  16   Temp:   98.1 F (36.7 C)   TempSrc:   Oral   SpO2: 96% 91% (!) 89% 94%  Weight:      Height:        Intake/Output Summary (Last 24 hours) at 08/22/2024 1349 Last data filed at 08/22/2024 1000 Gross per 24 hour  Intake 240 ml  Output --  Net 240 ml   Filed Weights   08/21/24 1320  Weight: 52.2 kg    Examination:  General exam: Fatigued Respiratory system: Diminished air entry, scattered crackles, scattered wheeze.  Normal work of breathing.  2 L Cardiovascular system: S1-S2, RRR, no murmurs, no pedal edema Gastrointestinal system: Thin, nontender, nondistended, normal bowel sounds. Central nervous system: Alert and oriented. No focal neurological deficits. Extremities: Symmetric 5 x 5 power. Skin: No rashes, lesions or ulcers Psychiatry: Judgement and insight appear normal. Mood & affect appropriate.     Data Reviewed: I have personally reviewed following labs and imaging studies  CBC: Recent Labs  Lab 08/21/24 1323  WBC 9.0  HGB 15.0  HCT 47.1*  MCV 95.5  PLT 127*   Basic Metabolic Panel: Recent Labs  Lab 08/21/24 1323  NA 139  K 4.9  CL 101  CO2 23  GLUCOSE 118*  BUN 33*  CREATININE 2.84*  CALCIUM  9.1   GFR: Estimated Creatinine Clearance: 13.4 mL/min (A) (by  C-G formula based on SCr of 2.84 mg/dL (H)). Liver Function Tests: No results for input(s): AST, ALT, ALKPHOS, BILITOT, PROT, ALBUMIN  in the last 168 hours. No results for input(s): LIPASE, AMYLASE in the last 168 hours. No results for input(s): AMMONIA in the last 168 hours. Coagulation Profile: Recent Labs  Lab 08/21/24 1323  INR 0.9   Cardiac Enzymes: No  results for input(s): CKTOTAL, CKMB, CKMBINDEX, TROPONINI in the last 168 hours. BNP (last 3 results) No results for input(s): PROBNP in the last 8760 hours. HbA1C: No results for input(s): HGBA1C in the last 72 hours. CBG: No results for input(s): GLUCAP in the last 168 hours. Lipid Profile: No results for input(s): CHOL, HDL, LDLCALC, TRIG, CHOLHDL, LDLDIRECT in the last 72 hours. Thyroid  Function Tests: No results for input(s): TSH, T4TOTAL, FREET4, T3FREE, THYROIDAB in the last 72 hours. Anemia Panel: No results for input(s): VITAMINB12, FOLATE, FERRITIN, TIBC, IRON, RETICCTPCT in the last 72 hours. Sepsis Labs: No results for input(s): PROCALCITON, LATICACIDVEN in the last 168 hours.  Recent Results (from the past 240 hours)  Resp panel by RT-PCR (RSV, Flu A&B, Covid) Anterior Nasal Swab     Status: Abnormal   Collection Time: 08/21/24  1:23 PM   Specimen: Anterior Nasal Swab  Result Value Ref Range Status   SARS Coronavirus 2 by RT PCR NEGATIVE NEGATIVE Final    Comment: (NOTE) SARS-CoV-2 target nucleic acids are NOT DETECTED.  The SARS-CoV-2 RNA is generally detectable in upper respiratory specimens during the acute phase of infection. The lowest concentration of SARS-CoV-2 viral copies this assay can detect is 138 copies/mL. A negative result does not preclude SARS-Cov-2 infection and should not be used as the sole basis for treatment or other patient management decisions. A negative result may occur with  improper specimen collection/handling, submission of specimen other than nasopharyngeal swab, presence of viral mutation(s) within the areas targeted by this assay, and inadequate number of viral copies(<138 copies/mL). A negative result must be combined with clinical observations, patient history, and epidemiological information. The expected result is Negative.  Fact Sheet for Patients:   bloggercourse.com  Fact Sheet for Healthcare Providers:  seriousbroker.it  This test is no t yet approved or cleared by the [place]  FDA and  has been authorized for detection and/or diagnosis of SARS-CoV-2 by FDA under an Emergency Use Authorization (EUA). This EUA will remain  in effect (meaning this test can be used) for the duration of the COVID-19 declaration under Section 564(b)(1) of the Act, 21 U.S.C.section 360bbb-3(b)(1), unless the authorization is terminated  or revoked sooner.       Influenza A by PCR POSITIVE (A) NEGATIVE Final   Influenza B by PCR NEGATIVE NEGATIVE Final    Comment: (NOTE) The Xpert Xpress SARS-CoV-2/FLU/RSV plus assay is intended as an aid in the diagnosis of influenza from Nasopharyngeal swab specimens and should not be used as a sole basis for treatment. Nasal washings and aspirates are unacceptable for Xpert Xpress SARS-CoV-2/FLU/RSV testing.  Fact Sheet for Patients: bloggercourse.com  Fact Sheet for Healthcare Providers: seriousbroker.it  This test is not yet approved or cleared by the [place]  FDA and has been authorized for detection and/or diagnosis of SARS-CoV-2 by FDA under an Emergency Use Authorization (EUA). This EUA will remain in effect (meaning this test can be used) for the duration of the COVID-19 declaration under Section 564(b)(1) of the Act, 21 U.S.C. section 360bbb-3(b)(1), unless the authorization is terminated or revoked.     Resp Syncytial  Virus by PCR NEGATIVE NEGATIVE Final    Comment: (NOTE) Fact Sheet for Patients: bloggercourse.com  Fact Sheet for Healthcare Providers: seriousbroker.it  This test is not yet approved or cleared by the [place]  FDA and has been authorized for detection and/or diagnosis of SARS-CoV-2 by FDA under an Emergency Use  Authorization (EUA). This EUA will remain in effect (meaning this test can be used) for the duration of the COVID-19 declaration under Section 564(b)(1) of the Act, 21 U.S.C. section 360bbb-3(b)(1), unless the authorization is terminated or revoked.  Performed at [facility], [address] [place], [place] [address]          Radiology Studies: DG Chest 2 View Result Date: 08/21/2024 CLINICAL DATA:  Shortness of breath since last night weight coarsened this morning. EXAM: CHEST - 2 VIEW COMPARISON:  02/23/2024 FINDINGS: Cardiomediastinal silhouette and pulmonary vasculature are within normal limits. Interval development of patchy opacity at the RIGHT lung base which is favored to be atelectasis. Lungs otherwise clear. Scoliotic curvature of the thoracolumbar spine again seen. Multiple BILATERAL healed rib fractures also again seen. IMPRESSION: Patchy opacities at the RIGHT lung base are favored to be atelectasis rather than pneumonia. Electronically Signed   By: Aliene Lloyd M.D.   On: 08/21/2024 14:41        Scheduled Meds:  apixaban   2.5 mg Oral BID   arformoterol   15 mcg Nebulization BID   atorvastatin   40 mg Oral Daily   budesonide  (PULMICORT ) nebulizer solution  0.25 mg Nebulization BID   buPROPion   150 mg Oral Daily   carvedilol   3.125 mg Oral BID   dapagliflozin  propanediol  10 mg Oral Daily   hydrALAZINE   25 mg Oral TID   ipratropium-albuterol   3 mL Nebulization BID   isosorbide  mononitrate  30 mg Oral Daily   levothyroxine   75 mcg Oral Q0600   loratadine   10 mg Oral Daily   methylPREDNISolone  (SOLU-MEDROL ) injection  40 mg Intravenous Q12H   pantoprazole   40 mg Oral Daily   venlafaxine  XR  150 mg Oral Daily   Continuous Infusions:   LOS: 0 days   Calvin KATHEE Robson, MD [facility]   If 7PM-7AM, please contact night-coverage  08/22/2024, 1:49 PM   "

## 2024-08-22 NOTE — ED Notes (Signed)
 Advised nurse that patient has ready bed

## 2024-08-23 DIAGNOSIS — J441 Chronic obstructive pulmonary disease with (acute) exacerbation: Secondary | ICD-10-CM | POA: Diagnosis not present

## 2024-08-23 MED ORDER — ALBUTEROL SULFATE (2.5 MG/3ML) 0.083% IN NEBU: 2.5000 mg | INHALATION_SOLUTION | RESPIRATORY_TRACT | Status: DC | PRN

## 2024-08-23 MED ORDER — IPRATROPIUM-ALBUTEROL 0.5-2.5 (3) MG/3ML IN SOLN
3.0000 mL | Freq: Four times a day (QID) | RESPIRATORY_TRACT | Status: DC
  Administered 2024-08-23 – 2024-08-24 (×3): 3 mL via RESPIRATORY_TRACT
  Filled 2024-08-23 (×3): qty 3

## 2024-08-23 NOTE — Plan of Care (Signed)
   Problem: Education: Goal: Knowledge of disease or condition will improve Outcome: Progressing Goal: Knowledge of the prescribed therapeutic regimen will improve Outcome: Progressing Goal: Individualized Educational Video(s) Outcome: Progressing   Problem: Activity: Goal: Ability to tolerate increased activity will improve Outcome: Progressing Goal: Will verbalize the importance of balancing activity with adequate rest periods Outcome: Progressing   Problem: Respiratory: Goal: Ability to maintain a clear airway will improve Outcome: Progressing Goal: Levels of oxygenation will improve Outcome: Progressing Goal: Ability to maintain adequate ventilation will improve Outcome: Progressing   Problem: Education: Goal: Knowledge of General Education information will improve Description: Including pain rating scale, medication(s)/side effects and non-pharmacologic comfort measures Outcome: Progressing   Problem: Health Behavior/Discharge Planning: Goal: Ability to manage health-related needs will improve Outcome: Progressing   Problem: Clinical Measurements: Goal: Ability to maintain clinical measurements within normal limits will improve Outcome: Progressing Goal: Will remain free from infection Outcome: Progressing Goal: Diagnostic test results will improve Outcome: Progressing Goal: Respiratory complications will improve Outcome: Progressing Goal: Cardiovascular complication will be avoided Outcome: Progressing   Problem: Activity: Goal: Risk for activity intolerance will decrease Outcome: Progressing   Problem: Nutrition: Goal: Adequate nutrition will be maintained Outcome: Progressing   Problem: Coping: Goal: Level of anxiety will decrease Outcome: Progressing   Problem: Elimination: Goal: Will not experience complications related to bowel motility Outcome: Progressing Goal: Will not experience complications related to urinary retention Outcome: Progressing    Problem: Pain Managment: Goal: General experience of comfort will improve and/or be controlled Outcome: Progressing   Problem: Safety: Goal: Ability to remain free from injury will improve Outcome: Progressing   Problem: Skin Integrity: Goal: Risk for impaired skin integrity will decrease Outcome: Progressing

## 2024-08-23 NOTE — Plan of Care (Signed)
   Problem: Education: Goal: Knowledge of General Education information will improve Description: Including pain rating scale, medication(s)/side effects and non-pharmacologic comfort measures Outcome: Progressing   Problem: Safety: Goal: Ability to remain free from injury will improve Outcome: Progressing   Problem: Skin Integrity: Goal: Risk for impaired skin integrity will decrease Outcome: Progressing

## 2024-08-23 NOTE — Progress Notes (Signed)
 " PROGRESS NOTE    Dawn Sherman  FMW:[id number] DOB: 1956-04-10 DOA: 08/21/2024 PCP: Glendia Shad, MD    Brief Narrative:  68 y.o. female with medical history significant of COPD Gold stage III, persistent A-fib status post Watchman procedure, on Eliquis , HTN, cardiomyopathy with chronic HFrEF, CKD stage IV, anxiety/depression, presented with worsening of cough wheezing shortness of breath. Symptoms started 3 days ago, when patient started develop sore throat dry cough and wheezing increase gradually worsening of exertional dyspnea.  Denies any chest pain, no fever or chills.  Overnight she had to use several rounds of albuterol  for wheezing and shortness of breath without significant improvement and decided to come to ED.   Assessment & Plan:   Principal Problem:   COPD exacerbation (HCC) Active Problems:   Acute hypoxemic respiratory failure (HCC)   Influenza A  Influenza A infection Out of window for Tamiflu Plan: - Supportive care - Droplet iso   Acute hypoxic respiratory failure  Acute COPD exacerbation COPD Gold stage III Still wheezing and SOB Remains dependent on 2 L nasal cannula Plan: Continue IV Solu-Medrol  Scheduled bronchodilators Stress incentive spirometry Likely discharge 12/30  Chronic HFrEF Appears euvolemic.  No evidence of exacerbation Plan: Continue home diuretic Continue Coreg , Imdur , hydralazine    PAF status post Watchman procedure - In sinus rhythm - Continue Eliquis  -Continue amiodarone    CKD stage IV - Euvolemic, creatinine stable - Resume diuresis   Hypothyroidism - Continue Synthroid     DVT prophylaxis: Eliquis  Code Status: Full Family Communication: None Disposition Plan: Status is: Inpatient Remains inpatient appropriate because: COPD exacerbation with resultant hypoxic respiratory failure     Level of care: Med-Surg  Consultants:  None  Procedures:  None  Antimicrobials: None   Subjective: Seen and  examined.  Clinically improving but continues to cough and shortness of breath.  Objective: Vitals:   08/23/24 0515 08/23/24 0743 08/23/24 0904 08/23/24 0906  BP: 129/76  (!) 163/72 (!) 163/72  Pulse: 88  (!) 101 (!) 101  Resp: 20   17  Temp: 99 F (37.2 C)   99.3 F (37.4 C)  TempSrc: Oral   Oral  SpO2: 91% 94%  (!) 89%  Weight:      Height:        Intake/Output Summary (Last 24 hours) at 08/23/2024 1247 Last data filed at 08/23/2024 0900 Gross per 24 hour  Intake 480 ml  Output --  Net 480 ml   Filed Weights   08/21/24 1320  Weight: 52.2 kg    Examination:  General exam: Appears fatigued otherwise stable Respiratory system: Diminished air entry.  Bibasilar crackles.  No wheeze.  Normal work of breathing.  2 L Cardiovascular system: S1-S2, RRR, no murmurs, no pedal edema Gastrointestinal system: Thin, nontender, nondistended, normal bowel sounds. Central nervous system: Alert and oriented. No focal neurological deficits. Extremities: Symmetric 5 x 5 power. Skin: No rashes, lesions or ulcers Psychiatry: Judgement and insight appear normal. Mood & affect appropriate.     Data Reviewed: I have personally reviewed following labs and imaging studies  CBC: Recent Labs  Lab 08/21/24 1323  WBC 9.0  HGB 15.0  HCT 47.1*  MCV 95.5  PLT 127*   Basic Metabolic Panel: Recent Labs  Lab 08/21/24 1323  NA 139  K 4.9  CL 101  CO2 23  GLUCOSE 118*  BUN 33*  CREATININE 2.84*  CALCIUM  9.1   GFR: Estimated Creatinine Clearance: 13.4 mL/min (A) (by C-G formula based on SCr of 2.84  mg/dL (H)). Liver Function Tests: No results for input(s): AST, ALT, ALKPHOS, BILITOT, PROT, ALBUMIN  in the last 168 hours. No results for input(s): LIPASE, AMYLASE in the last 168 hours. No results for input(s): AMMONIA in the last 168 hours. Coagulation Profile: Recent Labs  Lab 08/21/24 1323  INR 0.9   Cardiac Enzymes: No results for input(s): CKTOTAL, CKMB,  CKMBINDEX, TROPONINI in the last 168 hours. BNP (last 3 results) No results for input(s): PROBNP in the last 8760 hours. HbA1C: No results for input(s): HGBA1C in the last 72 hours. CBG: No results for input(s): GLUCAP in the last 168 hours. Lipid Profile: No results for input(s): CHOL, HDL, LDLCALC, TRIG, CHOLHDL, LDLDIRECT in the last 72 hours. Thyroid  Function Tests: No results for input(s): TSH, T4TOTAL, FREET4, T3FREE, THYROIDAB in the last 72 hours. Anemia Panel: No results for input(s): VITAMINB12, FOLATE, FERRITIN, TIBC, IRON, RETICCTPCT in the last 72 hours. Sepsis Labs: No results for input(s): PROCALCITON, LATICACIDVEN in the last 168 hours.  Recent Results (from the past 240 hours)  Resp panel by RT-PCR (RSV, Flu A&B, Covid) Anterior Nasal Swab     Status: Abnormal   Collection Time: 08/21/24  1:23 PM   Specimen: Anterior Nasal Swab  Result Value Ref Range Status   SARS Coronavirus 2 by RT PCR NEGATIVE NEGATIVE Final    Comment: (NOTE) SARS-CoV-2 target nucleic acids are NOT DETECTED.  The SARS-CoV-2 RNA is generally detectable in upper respiratory specimens during the acute phase of infection. The lowest concentration of SARS-CoV-2 viral copies this assay can detect is 138 copies/mL. A negative result does not preclude SARS-Cov-2 infection and should not be used as the sole basis for treatment or other patient management decisions. A negative result may occur with  improper specimen collection/handling, submission of specimen other than nasopharyngeal swab, presence of viral mutation(s) within the areas targeted by this assay, and inadequate number of viral copies(<138 copies/mL). A negative result must be combined with clinical observations, patient history, and epidemiological information. The expected result is Negative.  Fact Sheet for Patients:  bloggercourse.com  Fact Sheet for  Healthcare Providers:  seriousbroker.it  This test is no t yet approved or cleared by the [place]  FDA and  has been authorized for detection and/or diagnosis of SARS-CoV-2 by FDA under an Emergency Use Authorization (EUA). This EUA will remain  in effect (meaning this test can be used) for the duration of the COVID-19 declaration under Section 564(b)(1) of the Act, 21 U.S.C.section 360bbb-3(b)(1), unless the authorization is terminated  or revoked sooner.       Influenza A by PCR POSITIVE (A) NEGATIVE Final   Influenza B by PCR NEGATIVE NEGATIVE Final    Comment: (NOTE) The Xpert Xpress SARS-CoV-2/FLU/RSV plus assay is intended as an aid in the diagnosis of influenza from Nasopharyngeal swab specimens and should not be used as a sole basis for treatment. Nasal washings and aspirates are unacceptable for Xpert Xpress SARS-CoV-2/FLU/RSV testing.  Fact Sheet for Patients: bloggercourse.com  Fact Sheet for Healthcare Providers: seriousbroker.it  This test is not yet approved or cleared by the [place]  FDA and has been authorized for detection and/or diagnosis of SARS-CoV-2 by FDA under an Emergency Use Authorization (EUA). This EUA will remain in effect (meaning this test can be used) for the duration of the COVID-19 declaration under Section 564(b)(1) of the Act, 21 U.S.C. section 360bbb-3(b)(1), unless the authorization is terminated or revoked.     Resp Syncytial Virus by PCR NEGATIVE NEGATIVE Final  Comment: (NOTE) Fact Sheet for Patients: bloggercourse.com  Fact Sheet for Healthcare Providers: seriousbroker.it  This test is not yet approved or cleared by the [place]  FDA and has been authorized for detection and/or diagnosis of SARS-CoV-2 by FDA under an Emergency Use Authorization (EUA). This EUA will remain in effect (meaning  this test can be used) for the duration of the COVID-19 declaration under Section 564(b)(1) of the Act, 21 U.S.C. section 360bbb-3(b)(1), unless the authorization is terminated or revoked.  Performed at [facility], [address] [place], [place] [address]          Radiology Studies: DG Chest 2 View Result Date: 08/21/2024 CLINICAL DATA:  Shortness of breath since last night weight coarsened this morning. EXAM: CHEST - 2 VIEW COMPARISON:  02/23/2024 FINDINGS: Cardiomediastinal silhouette and pulmonary vasculature are within normal limits. Interval development of patchy opacity at the RIGHT lung base which is favored to be atelectasis. Lungs otherwise clear. Scoliotic curvature of the thoracolumbar spine again seen. Multiple BILATERAL healed rib fractures also again seen. IMPRESSION: Patchy opacities at the RIGHT lung base are favored to be atelectasis rather than pneumonia. Electronically Signed   By: Aliene Lloyd M.D.   On: 08/21/2024 14:41        Scheduled Meds:  amiodarone   200 mg Oral Daily   amLODipine   10 mg Oral Daily   apixaban   2.5 mg Oral BID   arformoterol   15 mcg Nebulization BID   atorvastatin   40 mg Oral Daily   budesonide  (PULMICORT ) nebulizer solution  0.25 mg Nebulization BID   buPROPion   150 mg Oral Daily   carvedilol   3.125 mg Oral BID   dapagliflozin  propanediol  10 mg Oral Daily   hydrALAZINE   25 mg Oral TID   ipratropium-albuterol   3 mL Nebulization BID   isosorbide  mononitrate  30 mg Oral Daily   levothyroxine   75 mcg Oral Q0600   loratadine   10 mg Oral Daily   methylPREDNISolone  (SOLU-MEDROL ) injection  40 mg Intravenous Q12H   pantoprazole   40 mg Oral Daily   torsemide   40 mg Oral Daily   venlafaxine  XR  150 mg Oral Daily   Continuous Infusions:   LOS: 1 day   Calvin KATHEE Robson, MD [facility]   If 7PM-7AM, please contact night-coverage  08/23/2024, 12:47 PM   "

## 2024-08-24 DIAGNOSIS — J441 Chronic obstructive pulmonary disease with (acute) exacerbation: Secondary | ICD-10-CM | POA: Diagnosis not present

## 2024-08-24 LAB — CBC WITH DIFFERENTIAL/PLATELET
Abs Immature Granulocytes: 0.09 K/uL — ABNORMAL HIGH (ref 0.00–0.07)
Basophils Absolute: 0 K/uL (ref 0.0–0.1)
Basophils Relative: 0 %
Eosinophils Absolute: 0 K/uL (ref 0.0–0.5)
Eosinophils Relative: 0 %
HCT: 46.1 % — ABNORMAL HIGH (ref 36.0–46.0)
Hemoglobin: 15 g/dL (ref 12.0–15.0)
Immature Granulocytes: 1 %
Lymphocytes Relative: 3 %
Lymphs Abs: 0.4 K/uL — ABNORMAL LOW (ref 0.7–4.0)
MCH: 30.3 pg (ref 26.0–34.0)
MCHC: 32.5 g/dL (ref 30.0–36.0)
MCV: 93.1 fL (ref 80.0–100.0)
Monocytes Absolute: 0.8 K/uL (ref 0.1–1.0)
Monocytes Relative: 6 %
Neutro Abs: 11.7 K/uL — ABNORMAL HIGH (ref 1.7–7.7)
Neutrophils Relative %: 90 %
Platelets: 163 K/uL (ref 150–400)
RBC: 4.95 MIL/uL (ref 3.87–5.11)
RDW: 13.9 % (ref 11.5–15.5)
WBC: 13.1 K/uL — ABNORMAL HIGH (ref 4.0–10.5)
nRBC: 0 % (ref 0.0–0.2)

## 2024-08-24 LAB — BASIC METABOLIC PANEL WITH GFR
Anion gap: 14 (ref 5–15)
BUN: 75 mg/dL — ABNORMAL HIGH (ref 8–23)
CO2: 23 mmol/L (ref 22–32)
Calcium: 9.4 mg/dL (ref 8.9–10.3)
Chloride: 103 mmol/L (ref 98–111)
Creatinine, Ser: 3.18 mg/dL — ABNORMAL HIGH (ref 0.44–1.00)
GFR, Estimated: 15 mL/min — ABNORMAL LOW
Glucose, Bld: 150 mg/dL — ABNORMAL HIGH (ref 70–99)
Potassium: 5 mmol/L (ref 3.5–5.1)
Sodium: 140 mmol/L (ref 135–145)

## 2024-08-24 MED ORDER — METHYLPREDNISOLONE SODIUM SUCC 40 MG IJ SOLR: 40.0000 mg | Freq: Every day | INTRAMUSCULAR | Status: DC

## 2024-08-24 MED ORDER — PREDNISONE 20 MG PO TABS
40.0000 mg | ORAL_TABLET | Freq: Every day | ORAL | Status: DC
  Administered 2024-08-25: 40 mg via ORAL
  Filled 2024-08-24: qty 2

## 2024-08-24 MED ORDER — IPRATROPIUM-ALBUTEROL 0.5-2.5 (3) MG/3ML IN SOLN
3.0000 mL | Freq: Two times a day (BID) | RESPIRATORY_TRACT | Status: DC
  Administered 2024-08-24 – 2024-08-25 (×2): 3 mL via RESPIRATORY_TRACT
  Filled 2024-08-24 (×2): qty 3

## 2024-08-24 NOTE — Progress Notes (Signed)
 Ambulatory Oxygen Test   Baseline (Resting): SpO2: 89-92% at 3L O2 [place] Ambulation: Lowest SpO2: 82-85% on 3L O2 [place] SpO2; Increased to 4L SpO2 93%

## 2024-08-24 NOTE — Care Management Important Message (Signed)
 Important Message  Patient Details  Name: Dawn Sherman MRN: [id number] Date of Birth: 11/25/1955   Important Message Given:  Yes - Medicare IM     [name] 08/24/2024, 2:04 PM

## 2024-08-24 NOTE — Plan of Care (Signed)
   Problem: Education: Goal: Knowledge of disease or condition will improve Outcome: Progressing Goal: Knowledge of the prescribed therapeutic regimen will improve Outcome: Progressing Goal: Individualized Educational Video(s) Outcome: Progressing   Problem: Activity: Goal: Ability to tolerate increased activity will improve Outcome: Progressing Goal: Will verbalize the importance of balancing activity with adequate rest periods Outcome: Progressing   Problem: Respiratory: Goal: Ability to maintain a clear airway will improve Outcome: Progressing Goal: Levels of oxygenation will improve Outcome: Progressing Goal: Ability to maintain adequate ventilation will improve Outcome: Progressing   Problem: Education: Goal: Knowledge of General Education information will improve Description: Including pain rating scale, medication(s)/side effects and non-pharmacologic comfort measures Outcome: Progressing   Problem: Health Behavior/Discharge Planning: Goal: Ability to manage health-related needs will improve Outcome: Progressing   Problem: Clinical Measurements: Goal: Ability to maintain clinical measurements within normal limits will improve Outcome: Progressing Goal: Will remain free from infection Outcome: Progressing Goal: Diagnostic test results will improve Outcome: Progressing Goal: Respiratory complications will improve Outcome: Progressing Goal: Cardiovascular complication will be avoided Outcome: Progressing   Problem: Activity: Goal: Risk for activity intolerance will decrease Outcome: Progressing   Problem: Nutrition: Goal: Adequate nutrition will be maintained Outcome: Progressing   Problem: Coping: Goal: Level of anxiety will decrease Outcome: Progressing   Problem: Elimination: Goal: Will not experience complications related to bowel motility Outcome: Progressing Goal: Will not experience complications related to urinary retention Outcome: Progressing    Problem: Pain Managment: Goal: General experience of comfort will improve and/or be controlled Outcome: Progressing   Problem: Safety: Goal: Ability to remain free from injury will improve Outcome: Progressing   Problem: Skin Integrity: Goal: Risk for impaired skin integrity will decrease Outcome: Progressing

## 2024-08-24 NOTE — Progress Notes (Signed)
 " PROGRESS NOTE    Dawn Sherman  FMW:[id number] DOB: [birth year] DOA: 08/21/2024 PCP: Glendia Shad, MD    Brief Narrative:  68 y.o. female with medical history significant of COPD Gold stage III, persistent A-fib status post Watchman procedure, on Eliquis , HTN, cardiomyopathy with chronic HFrEF, CKD stage IV, anxiety/depression, presented with worsening of cough wheezing shortness of breath. Symptoms started 3 days ago, when patient started develop sore throat dry cough and wheezing increase gradually worsening of exertional dyspnea.  Denies any chest pain, no fever or chills.  Overnight she had to use several rounds of albuterol  for wheezing and shortness of breath without significant improvement and decided to come to ED.   Assessment & Plan:   Principal Problem:   COPD exacerbation (HCC) Active Problems:   Acute hypoxemic respiratory failure (HCC)   Influenza A  Influenza A infection Out of window for Tamiflu Plan: Continue supportive care Respiratory isolation  Acute hypoxic respiratory failure  Acute COPD exacerbation COPD Gold stage III Still wheezing and SOB He is on 3 L nasal cannula Plan: P.o. prednisone  40 mg daily Scheduled bronchodilators Stress incentive spirometry Anticipate discharge 12/31 Will likely need home oxygen Will ambulate prior to discharge  Chronic HFrEF Appears euvolemic.  No evidence of exacerbation Plan: Continue home diuretic Continue Coreg , Imdur , hydralazine    PAF status post Watchman procedure - In sinus rhythm - Continue Eliquis  -Continue amiodarone    CKD stage IV - Euvolemic, creatinine stable - Resume diuresis   Hypothyroidism - Continue Synthroid     DVT prophylaxis: Eliquis  Code Status: Full Family Communication: None Disposition Plan: Status is: Inpatient Remains inpatient appropriate because: COPD exacerbation with resultant hypoxic respiratory failure     Level of care: Med-Surg  Consultants:   None  Procedures:  None  Antimicrobials: None   Subjective: Seen and examined.  Clinically improving but continues to cough and shortness of breath.  Objective: Vitals:   08/24/24 0258 08/24/24 0844 08/24/24 0857 08/24/24 1113  BP: (!) 145/79 (!) 167/83  (!) 150/74  Pulse: 89 (!) 114  (!) 105  Resp: 18 19  20   Temp: 98.2 F (36.8 C) 98.1 F (36.7 C)  98 F (36.7 C)  TempSrc: Oral   Oral  SpO2: 96% (!) 89% 90% 91%  Weight:      Height:        Intake/Output Summary (Last 24 hours) at 08/24/2024 1448 Last data filed at 08/24/2024 1406 Gross per 24 hour  Intake 760 ml  Output --  Net 760 ml   Filed Weights   08/21/24 1320  Weight: 52.2 kg    Examination:  General exam: Appears fatigued otherwise stable Respiratory system: Diminished air entry.  Bibasilar crackles.  Bibasilar wheeze.  Normal work of breathing.  3 L Cardiovascular system: S1-S2, RRR, no murmurs, no pedal edema Gastrointestinal system: Thin, nontender, nondistended, normal bowel sounds. Central nervous system: Alert and oriented. No focal neurological deficits. Extremities: Symmetric 5 x 5 power. Skin: No rashes, lesions or ulcers Psychiatry: Judgement and insight appear normal. Mood & affect appropriate.     Data Reviewed: I have personally reviewed following labs and imaging studies  CBC: Recent Labs  Lab 08/21/24 1323 08/24/24 1051  WBC 9.0 13.1*  NEUTROABS  --  11.7*  HGB 15.0 15.0  HCT 47.1* 46.1*  MCV 95.5 93.1  PLT 127* 163   Basic Metabolic Panel: Recent Labs  Lab 08/21/24 1323 08/24/24 1051  NA 139 140  K 4.9 5.0  CL 101 103  CO2 23 23  GLUCOSE 118* 150*  BUN 33* 75*  CREATININE 2.84* 3.18*  CALCIUM  9.1 9.4   GFR: Estimated Creatinine Clearance: 12 mL/min (A) (by C-G formula based on SCr of 3.18 mg/dL (H)). Liver Function Tests: No results for input(s): AST, ALT, ALKPHOS, BILITOT, PROT, ALBUMIN  in the last 168 hours. No results for input(s):  LIPASE, AMYLASE in the last 168 hours. No results for input(s): AMMONIA in the last 168 hours. Coagulation Profile: Recent Labs  Lab 08/21/24 1323  INR 0.9   Cardiac Enzymes: No results for input(s): CKTOTAL, CKMB, CKMBINDEX, TROPONINI in the last 168 hours. BNP (last 3 results) No results for input(s): PROBNP in the last 8760 hours. HbA1C: No results for input(s): HGBA1C in the last 72 hours. CBG: No results for input(s): GLUCAP in the last 168 hours. Lipid Profile: No results for input(s): CHOL, HDL, LDLCALC, TRIG, CHOLHDL, LDLDIRECT in the last 72 hours. Thyroid  Function Tests: No results for input(s): TSH, T4TOTAL, FREET4, T3FREE, THYROIDAB in the last 72 hours. Anemia Panel: No results for input(s): VITAMINB12, FOLATE, FERRITIN, TIBC, IRON, RETICCTPCT in the last 72 hours. Sepsis Labs: No results for input(s): PROCALCITON, LATICACIDVEN in the last 168 hours.  Recent Results (from the past 240 hours)  Resp panel by RT-PCR (RSV, Flu A&B, Covid) Anterior Nasal Swab     Status: Abnormal   Collection Time: 08/21/24  1:23 PM   Specimen: Anterior Nasal Swab  Result Value Ref Range Status   SARS Coronavirus 2 by RT PCR NEGATIVE NEGATIVE Final    Comment: (NOTE) SARS-CoV-2 target nucleic acids are NOT DETECTED.  The SARS-CoV-2 RNA is generally detectable in upper respiratory specimens during the acute phase of infection. The lowest concentration of SARS-CoV-2 viral copies this assay can detect is 138 copies/mL. A negative result does not preclude SARS-Cov-2 infection and should not be used as the sole basis for treatment or other patient management decisions. A negative result may occur with  improper specimen collection/handling, submission of specimen other than nasopharyngeal swab, presence of viral mutation(s) within the areas targeted by this assay, and inadequate number of viral copies(<138 copies/mL). A negative  result must be combined with clinical observations, patient history, and epidemiological information. The expected result is Negative.  Fact Sheet for Patients:  bloggercourse.com  Fact Sheet for Healthcare Providers:  seriousbroker.it  This test is no t yet approved or cleared by the [place]  FDA and  has been authorized for detection and/or diagnosis of SARS-CoV-2 by FDA under an Emergency Use Authorization (EUA). This EUA will remain  in effect (meaning this test can be used) for the duration of the COVID-19 declaration under Section 564(b)(1) of the Act, 21 U.S.C.section 360bbb-3(b)(1), unless the authorization is terminated  or revoked sooner.       Influenza A by PCR POSITIVE (A) NEGATIVE Final   Influenza B by PCR NEGATIVE NEGATIVE Final    Comment: (NOTE) The Xpert Xpress SARS-CoV-2/FLU/RSV plus assay is intended as an aid in the diagnosis of influenza from Nasopharyngeal swab specimens and should not be used as a sole basis for treatment. Nasal washings and aspirates are unacceptable for Xpert Xpress SARS-CoV-2/FLU/RSV testing.  Fact Sheet for Patients: bloggercourse.com  Fact Sheet for Healthcare Providers: seriousbroker.it  This test is not yet approved or cleared by the [place]  FDA and has been authorized for detection and/or diagnosis of SARS-CoV-2 by FDA under an Emergency Use Authorization (EUA). This EUA will remain in effect (meaning this test can be used) for  the duration of the COVID-19 declaration under Section 564(b)(1) of the Act, 21 U.S.C. section 360bbb-3(b)(1), unless the authorization is terminated or revoked.     Resp Syncytial Virus by PCR NEGATIVE NEGATIVE Final    Comment: (NOTE) Fact Sheet for Patients: bloggercourse.com  Fact Sheet for Healthcare  Providers: seriousbroker.it  This test is not yet approved or cleared by the [place]  FDA and has been authorized for detection and/or diagnosis of SARS-CoV-2 by FDA under an Emergency Use Authorization (EUA). This EUA will remain in effect (meaning this test can be used) for the duration of the COVID-19 declaration under Section 564(b)(1) of the Act, 21 U.S.C. section 360bbb-3(b)(1), unless the authorization is terminated or revoked.  Performed at [facility], [address], [place], [place] [address]          Radiology Studies: No results found.       Scheduled Meds:  amiodarone   200 mg Oral Daily   amLODipine   10 mg Oral Daily   apixaban   2.5 mg Oral BID   arformoterol   15 mcg Nebulization BID   atorvastatin   40 mg Oral Daily   budesonide  (PULMICORT ) nebulizer solution  0.25 mg Nebulization BID   buPROPion   150 mg Oral Daily   carvedilol   3.125 mg Oral BID   dapagliflozin  propanediol  10 mg Oral Daily   hydrALAZINE   25 mg Oral TID   ipratropium-albuterol   3 mL Nebulization BID   isosorbide  mononitrate  30 mg Oral Daily   levothyroxine   75 mcg Oral Q0600   loratadine   10 mg Oral Daily   pantoprazole   40 mg Oral Daily   [START ON 08/25/2024] predniSONE   40 mg Oral Q breakfast   torsemide   40 mg Oral Daily   venlafaxine  XR  150 mg Oral Daily   Continuous Infusions:   LOS: 2 days   Calvin KATHEE Robson, MD [facility]   If 7PM-7AM, please contact night-coverage  08/24/2024, 2:48 PM   "

## 2024-08-24 NOTE — TOC Initial Note (Addendum)
 Transition of Care [facility]) - Initial/Assessment Note    Patient Details  Name: Dawn Sherman MRN: [id number] Date of Birth: Feb 18, 1956  [facility] of [facility]) CM/SW Contact:    Shasta DELENA Daring, RN Phone Number: 08/24/2024, 2:14 PM  Clinical Narrative:                 Readmission risk assessment complete.  RNCM assessed patient at bedside. Husband and adult son present. Patient alert and oriented.  Patient lives with husband and son in single family home. Husband drives her to appointments. PCP is [facility]. Patient does not have any HH services. Formerly had home O2 with [facility], but it was discontinued last month.  Said she wants to continue with [facility] if home O2 is prescribed again. Has a walker at home, no additional DME. Uses [facility] [place] for pharmacy. No problems affording medications at this time. Husband will provide transportation home at discharge.  TOC will follow for home O2 orders  4:29 PM Notified by RT and MD that DME orders for home O2 are indicated. Notified Mitch with [employer].   Expected Discharge Plan: Home/Self Care Barriers to Discharge: Continued Medical Work up   Patient Goals and CMS Choice            Expected Discharge Plan and Services   Discharge Planning Services: CM Consult   Living arrangements for the past 2 months: Single Family Home                                      Prior Living Arrangements/Services Living arrangements for the past 2 months: Single Family Home Lives with:: Self, Adult Children, Spouse Patient language and need for interpreter reviewed:: Yes Do you feel safe going back to the place where you live?: Yes      Need for Family Participation in Patient Care: Yes (Comment) Care giver support system in place?: Yes (comment)   Criminal Activity/Legal Involvement Pertinent to Current Situation/Hospitalization: No - Comment as needed  Activities of Daily Living   ADL Screening (condition at time of  admission) Independently performs ADLs?: Yes (appropriate for developmental age) Is the patient deaf or have difficulty hearing?: No Does the patient have difficulty seeing, even when wearing glasses/contacts?: No Does the patient have difficulty concentrating, remembering, or making decisions?: No  Permission Sought/Granted Permission sought to share information with : Facility Medical Sales Representative, Case Estate Manager/land Agent granted to share information with : Yes, Verbal Permission Granted              Emotional Assessment Appearance:: Appears younger than stated age Attitude/Demeanor/Rapport: Gracious, Engaged Affect (typically observed): Appropriate Orientation: : Oriented to Situation, Oriented to  Time, Oriented to Place, Oriented to Self Alcohol / Substance Use: Not Applicable Psych Involvement: No (comment)  Admission diagnosis:  Influenza A [J10.1] COPD exacerbation (HCC) [J44.1] Acute respiratory failure with hypoxia (HCC) [J96.01] Patient Active Problem List   Diagnosis Date Noted   Influenza A 08/21/2024   COPD exacerbation (HCC) 02/23/2024   Alcohol dependence (HCC) 02/22/2024   Demand ischemia (HCC) 12/08/2023   Symptomatic bradycardia 12/07/2023   Supratherapeutic INR 12/07/2023   Acute gastroenteritis 12/07/2023   Head congestion 10/26/2023   Atrial fibrillation (HCC) 10/08/2023   Heart failure with improved ejection fraction (HFimpEF) (HCC) 12/03/2022   Hypoxia 11/15/2022   Dilated cardiomyopathy (HCC) 11/15/2022   Mitral valve insufficiency 11/15/2022   Hyperkalemia 11/13/2022  Symptomatic anemia 11/12/2022   Pneumonia due to infectious organism 09/30/2022   Pleural effusion due to CHF (congestive heart failure) (HCC) 09/30/2022   Acute on chronic diastolic CHF (congestive heart failure) (HCC) 09/23/2022   Chest pain 09/23/2022   Acute on chronic respiratory failure with hypoxia (HCC) 09/23/2022   (HFpEF) heart failure with preserved ejection fraction  (HCC) 09/07/2022   Positive D dimer 09/07/2022   Depression with anxiety 09/07/2022   Hypothyroidism 09/07/2022   Epigastric pain 08/14/2022   SOB (shortness of breath) 07/16/2022   Fatigue 07/16/2022   Acute on chronic congestive heart failure (HCC) 07/04/2022   Acute hypoxemic respiratory failure (HCC) 06/30/2022   Tachycardia 06/30/2022   Anemia 05/06/2022   HFrEF (heart failure with reduced ejection fraction) (HCC) 03/26/2022   Myocardial injury 03/26/2022   CKD (chronic kidney disease), stage IV (HCC) 03/26/2022   Iron deficiency anemia 03/26/2022   Generalized weakness 12/17/2021   Essential hypertension 12/17/2021   Dyslipidemia 12/17/2021   Depression, major, single episode, complete remission 12/17/2021   GERD without esophagitis 12/17/2021   Chronic diastolic CHF (congestive heart failure) (HCC) 12/17/2021   AKI (acute kidney injury) 12/16/2021   Second degree AV block    History of subarachnoid hemorrhage 12/10/2021   Hypokalemia 12/10/2021   Hyponatremia 12/10/2021   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   Persistent atrial fibrillation (HCC) 12/10/2021   Hypotension 12/10/2021   Swelling of lower extremity 10/28/2021   Prediabetes 08/19/2021   Chronic obstructive pulmonary disease (HCC) 11/22/2020   Paroxysmal atrial fibrillation (HCC) 08/12/2020   Dehydration    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HCC)    Acute on chronic combined systolic and diastolic CHF (congestive heart failure) (HCC) 02/10/2020   Leukocytosis 02/10/2020   HLD (hyperlipidemia) 02/10/2020   Elevated troponin 02/10/2020   GERD (gastroesophageal reflux disease) 02/10/2020   Acute respiratory failure with hypoxia (HCC) 02/10/2020   Abnormal mammogram 07/13/2019   History of alcohol abuse 07/31/2018   B12 deficiency 07/31/2018   Acute renal failure superimposed on stage 3b chronic kidney disease (HCC) 11/03/2016   Health care maintenance 12/02/2014   Tobacco use 12/02/2014    Hypercholesterolemia 11/29/2014   Encounter for screening colonoscopy 12/16/2013   Breast mass 12/15/2012   Hypertension 07/05/2012   Depression, major, single episode, mild 07/05/2012   PCP:  Glendia Shad, MD Pharmacy:   [facility] [phone] [facility] [place], [place] [address] [place] [address] Phone: [phone] Fax: [phone]  [facility] [address] [place] [address] Phone: [phone] Fax: [phone]  [facility] [place], [place] - [address] [place] [address] Phone: [phone] Fax: [phone]     Social Drivers of Health (SDOH) Social History: SDOH Screenings   Food Insecurity: No Food Insecurity (08/22/2024)  Housing: Low Risk (08/22/2024)  Transportation Needs: No Transportation Needs (08/22/2024)  Utilities: Not At Risk (08/22/2024)  Alcohol Screen: Low Risk (12/15/2023)  Depression (PHQ2-9): Low Risk (05/31/2024)  Financial Resource Strain: Low Risk (12/15/2023)  Physical Activity: Inactive (12/15/2023)  Social Connections: Socially Integrated (08/22/2024)  Stress: No Stress Concern Present (12/15/2023)  Tobacco Use: Medium Risk (08/21/2024)  Health Literacy: Adequate Health Literacy (12/16/2023)   SDOH Interventions:     Readmission Risk Interventions    08/24/2024    2:11 PM 02/25/2024   11:54 AM 07/02/2022   12:26 PM  Readmission Risk Prevention Plan  Transportation Screening Complete Complete Complete  PCP or Specialist Appt within 3-5 Days Complete Complete   HRI or Home Care Consult Complete    Social Work Consult for Recovery Care Planning/Counseling Complete Complete   Palliative Care Screening Not Applicable Not Applicable   Medication Review Oceanographer) Complete Complete Complete  PCP or Specialist appointment within 3-5 days of discharge   Complete  SW Recovery Care/Counseling Consult   Complete  Palliative Care Screening   Not  Applicable  Skilled Nursing Facility   Not Applicable

## 2024-08-25 MED ORDER — TORSEMIDE 20 MG PO TABS: 60.0000 mg | ORAL_TABLET | Freq: Every day | ORAL | Status: DC

## 2024-08-25 NOTE — Discharge Summary (Signed)
 " Physician Discharge Summary   Patient: Dawn Sherman MRN: [id number] DOB: Dec 25, 1955  Admit date:     08/21/2024  Discharge date: 08/25/2024  Discharge Physician: AIDA CHO   PCP: Glendia Shad, MD   Recommendations at discharge:   Follow-up with PCP in 1 week  Discharge Diagnoses: Principal Problem:   COPD exacerbation (HCC) Active Problems:   Acute on chronic respiratory failure with hypoxia (HCC)   Acute hypoxemic respiratory failure (HCC)   Influenza A  Resolved Problems:   * No resolved hospital problems. [facility] Course:  Dawn Sherman is a 68 y.o. female with medical history significant of COPD Gold stage III, persistent A-fib status post Watchman procedure, on Eliquis , HTN, cardiomyopathy with chronic HFrEF, CKD stage IV, anxiety/depression, who presented with worsening sore throat, cough, wheezing shortness of breath.  Symptoms started about 3 days prior to admission.  She used several rounds of albuterol  for wheezing and shortness of breath without significant improvement.  She therefore presented to the ED for further management.  Assessment and Plan:   Influenza A infection: Improved.  She was out of the window for Tamiflu.   COPD exacerbation: Improved.  She was treated with bronchodilators and steroids.   Acute hypoxic respiratory failure: She could not be weaned off of oxygen.  Oxygen saturation on room air at rest was 89%.  Her oxygen saturation on room air while ambulating was 85% and oxygen saturation on 4 L of oxygen with ambulation was 92%. She was discharged on 4 L oxygen via nasal cannula.   Chronic HFrEF: Compensated   Comorbidities include CKD stage IV, paroxysmal atrial fibrillation s/p Watchman procedure (on Eliquis  and amiodarone ), hypothyroidism        Consultants: None Procedures performed: None Disposition: Home Diet recommendation:  Cardiac diet DISCHARGE MEDICATION: Allergies as of 08/25/2024       Reactions    Sulfate Rash   Codeine Sulfate Nausea Only   Benicar [olmesartan]    Talked with patient February 10, 2020, intolerance is unclear, tried several medications around that time and one of them gave her a rash but she is not clear which 1.   Amoxicillin Rash   Clindamycin Dermatitis   Clindamycin/lincomycin Rash   Entresto  [sacubitril -valsartan ] Other (See Comments)   hyperkalemia   Morphine And Codeine Rash   Penicillins Rash        Medication List     TAKE these medications    acetaminophen  325 MG tablet Commonly known as: TYLENOL  Take 650 mg by mouth every 6 (six) hours as needed for mild pain (pain score 1-3) or moderate pain (pain score 4-6) (pain.).   albuterol  108 (90 Base) MCG/ACT inhaler Commonly known as: VENTOLIN  HFA Inhale 2 puffs into the lungs every 6 (six) hours as needed for wheezing or shortness of breath.   amiodarone  200 MG tablet Commonly known as: PACERONE  Take 200 mg by mouth daily.   amLODipine  10 MG tablet Commonly known as: NORVASC  Take 10 mg by mouth daily.   apixaban  2.5 MG Tabs tablet Commonly known as: Eliquis  Take 1 tablet (2.5 mg total) by mouth 2 (two) times daily.   atorvastatin  40 MG tablet Commonly known as: LIPITOR TAKE ONE TABLET BY MOUTH EVERY DAY   buPROPion  150 MG 24 hr tablet Commonly known as: WELLBUTRIN  XL Take 1 tablet (150 mg total) by mouth daily.   calcitRIOL  0.25 MCG capsule Commonly known as: ROCALTROL  Take 0.25 mcg by mouth daily.   calcium  carbonate 1250 (500  Ca) MG tablet Commonly known as: OS-CAL - dosed in mg of elemental calcium  Take 1 tablet (500 mg of elemental calcium  total) by mouth 3 (three) times daily with meals.   carvedilol  12.5 MG tablet Commonly known as: COREG  Take 12.5 mg by mouth 2 (two) times daily with a meal.   Farxiga  10 MG Tabs tablet Generic drug: dapagliflozin  propanediol Take 1 tablet (10 mg total) by mouth daily.   hydrALAZINE  25 MG tablet Commonly known as: APRESOLINE  Take 1  tablet (25 mg total) by mouth 3 (three) times daily.   isosorbide  mononitrate 30 MG 24 hr tablet Commonly known as: IMDUR  Take 1 tablet (30 mg total) by mouth daily.   levothyroxine  75 MCG tablet Commonly known as: SYNTHROID  TAKE ONE TABLET BY MOUTH DAILY BEFORE BREAKFAST   loratadine  10 MG tablet Commonly known as: CLARITIN  Take 10 mg by mouth daily.   omeprazole  20 MG capsule Commonly known as: PRILOSEC Take 1 capsule (20 mg total) by mouth daily.   torsemide  20 MG tablet Commonly known as: DEMADEX  Take 3 tablets (60 mg total) by mouth daily. Take 60 mg alternating with 40 mg every other day   umeclidinium-vilanterol 62.5-25 MCG/ACT Aepb Commonly known as: Anoro Ellipta  Inhale 1 puff into the lungs daily.   venlafaxine  XR 150 MG 24 hr capsule Commonly known as: EFFEXOR -XR TAKE ONE CAPSULE BY MOUTH EVERY DAY               Durable Medical Equipment  (From admission, onward)           Start     Ordered   08/25/24 0000  For home use only DME oxygen       Question Answer Comment  Length of Need Lifetime   Liters per Minute 4   Frequency Continuous (stationary and portable oxygen unit needed)   Oxygen delivery system: Gas   Oxygen delivery system: Portable concentrator (POC)      08/25/24 1128   08/24/24 1627  For home use only DME oxygen  Once       Question Answer Comment  Length of Need 6 Months   Mode or (Route) Nasal cannula   Liters per Minute 3   Frequency Continuous (stationary and portable oxygen unit needed)   Oxygen conserving device Yes   Oxygen delivery system: Gas   Oxygen delivery system: Portable concentrator (POC)      08/24/24 1626            Discharge Exam: Filed Weights   08/21/24 1320  Weight: 52.2 kg   GEN: NAD SKIN: Warm and dry EYES: No pallor or icterus ENT: MMM CV: RRR PULM: CTA B ABD: soft, ND, NT, +BS CNS: AAO x 3, non focal EXT: No edema or tenderness   Condition at discharge: good  The results of  significant diagnostics from this hospitalization (including imaging, microbiology, ancillary and laboratory) are listed below for reference.   Imaging Studies: DG Chest 2 View Result Date: 08/21/2024 CLINICAL DATA:  Shortness of breath since last night weight coarsened this morning. EXAM: CHEST - 2 VIEW COMPARISON:  02/23/2024 FINDINGS: Cardiomediastinal silhouette and pulmonary vasculature are within normal limits. Interval development of patchy opacity at the RIGHT lung base which is favored to be atelectasis. Lungs otherwise clear. Scoliotic curvature of the thoracolumbar spine again seen. Multiple BILATERAL healed rib fractures also again seen. IMPRESSION: Patchy opacities at the RIGHT lung base are favored to be atelectasis rather than pneumonia. Electronically Signed   By: Farhaan  Mir M.D.   On: 08/21/2024 14:41    Microbiology: Results for orders placed or performed during the hospital encounter of 08/21/24  Resp panel by RT-PCR (RSV, Flu A&B, Covid) Anterior Nasal Swab     Status: Abnormal   Collection Time: 08/21/24  1:23 PM   Specimen: Anterior Nasal Swab  Result Value Ref Range Status   SARS Coronavirus 2 by RT PCR NEGATIVE NEGATIVE Final    Comment: (NOTE) SARS-CoV-2 target nucleic acids are NOT DETECTED.  The SARS-CoV-2 RNA is generally detectable in upper respiratory specimens during the acute phase of infection. The lowest concentration of SARS-CoV-2 viral copies this assay can detect is 138 copies/mL. A negative result does not preclude SARS-Cov-2 infection and should not be used as the sole basis for treatment or other patient management decisions. A negative result may occur with  improper specimen collection/handling, submission of specimen other than nasopharyngeal swab, presence of viral mutation(s) within the areas targeted by this assay, and inadequate number of viral copies(<138 copies/mL). A negative result must be combined with clinical observations, patient  history, and epidemiological information. The expected result is Negative.  Fact Sheet for Patients:  bloggercourse.com  Fact Sheet for Healthcare Providers:  seriousbroker.it  This test is no t yet approved or cleared by the [place]  FDA and  has been authorized for detection and/or diagnosis of SARS-CoV-2 by FDA under an Emergency Use Authorization (EUA). This EUA will remain  in effect (meaning this test can be used) for the duration of the COVID-19 declaration under Section 564(b)(1) of the Act, 21 U.S.C.section 360bbb-3(b)(1), unless the authorization is terminated  or revoked sooner.       Influenza A by PCR POSITIVE (A) NEGATIVE Final   Influenza B by PCR NEGATIVE NEGATIVE Final    Comment: (NOTE) The Xpert Xpress SARS-CoV-2/FLU/RSV plus assay is intended as an aid in the diagnosis of influenza from Nasopharyngeal swab specimens and should not be used as a sole basis for treatment. Nasal washings and aspirates are unacceptable for Xpert Xpress SARS-CoV-2/FLU/RSV testing.  Fact Sheet for Patients: bloggercourse.com  Fact Sheet for Healthcare Providers: seriousbroker.it  This test is not yet approved or cleared by the [place]  FDA and has been authorized for detection and/or diagnosis of SARS-CoV-2 by FDA under an Emergency Use Authorization (EUA). This EUA will remain in effect (meaning this test can be used) for the duration of the COVID-19 declaration under Section 564(b)(1) of the Act, 21 U.S.C. section 360bbb-3(b)(1), unless the authorization is terminated or revoked.     Resp Syncytial Virus by PCR NEGATIVE NEGATIVE Final    Comment: (NOTE) Fact Sheet for Patients: bloggercourse.com  Fact Sheet for Healthcare Providers: seriousbroker.it  This test is not yet approved or cleared by the [place]   FDA and has been authorized for detection and/or diagnosis of SARS-CoV-2 by FDA under an Emergency Use Authorization (EUA). This EUA will remain in effect (meaning this test can be used) for the duration of the COVID-19 declaration under Section 564(b)(1) of the Act, 21 U.S.C. section 360bbb-3(b)(1), unless the authorization is terminated or revoked.  Performed at [facility] [address] [place], [place] [address]    *Note: Due to a large number of results and/or encounters for the requested time period, some results have not been displayed. A complete set of results can be found in Results Review.    Labs: CBC: Recent Labs  Lab 08/21/24 1323 08/24/24 1051  WBC 9.0 13.1*  NEUTROABS  --  11.7*  HGB 15.0 15.0  HCT 47.1* 46.1*  MCV 95.5 93.1  PLT 127* 163   Basic Metabolic Panel: Recent Labs  Lab 08/21/24 1323 08/24/24 1051  NA 139 140  K 4.9 5.0  CL 101 103  CO2 23 23  GLUCOSE 118* 150*  BUN 33* 75*  CREATININE 2.84* 3.18*  CALCIUM  9.1 9.4   Liver Function Tests: No results for input(s): AST, ALT, ALKPHOS, BILITOT, PROT, ALBUMIN  in the last 168 hours. CBG: No results for input(s): GLUCAP in the last 168 hours.  Discharge time spent: greater than 30 minutes.  Signed: AIDA CHO, MD [facility] 08/25/2024 "

## 2024-08-25 NOTE — TOC Transition Note (Signed)
 Transition of Care [facility]) - Discharge Note   Patient Details  Name: Dawn Sherman MRN: [id number] Date of Birth: 06-22-1956  Transition of [facility] CM/SW Contact:  Corean ONEIDA Haddock, RN Phone Number: 08/25/2024, 4:03 PM   Clinical Narrative:     Updated o2 sats documented in chart.  Mitch with [employer] notified.  O2 to be delivered to room    Barriers to Discharge: Continued Medical Work up   Patient Goals and CMS Choice            Discharge Placement                       Discharge Plan and Services Additional resources added to the After Visit Summary for     Discharge Planning Services: CM Consult                                 Social Drivers of Health (SDOH) Interventions SDOH Screenings   Food Insecurity: No Food Insecurity (08/22/2024)  Housing: Low Risk (08/22/2024)  Transportation Needs: No Transportation Needs (08/22/2024)  Utilities: Not At Risk (08/22/2024)  Alcohol Screen: Low Risk (12/15/2023)  Depression (PHQ2-9): Low Risk (05/31/2024)  Financial Resource Strain: Low Risk (12/15/2023)  Physical Activity: Inactive (12/15/2023)  Social Connections: Socially Integrated (08/22/2024)  Stress: No Stress Concern Present (12/15/2023)  Tobacco Use: Medium Risk (08/21/2024)  Health Literacy: Adequate Health Literacy (12/16/2023)     Readmission Risk Interventions    08/24/2024    2:11 PM 02/25/2024   11:54 AM 07/02/2022   12:26 PM  Readmission Risk Prevention Plan  Transportation Screening Complete Complete Complete  PCP or Specialist Appt within 3-5 Days Complete Complete   HRI or Home Care Consult Complete    Social Work Consult for Recovery Care Planning/Counseling Complete Complete   Palliative Care Screening Not Applicable Not Applicable   Medication Review Oceanographer) Complete Complete Complete  PCP or Specialist appointment within 3-5 days of discharge   Complete  SW Recovery Care/Counseling Consult   Complete  Palliative  Care Screening   Not Applicable  Skilled Nursing Facility   Not Applicable

## 2024-08-25 NOTE — Plan of Care (Signed)

## 2024-08-25 NOTE — Progress Notes (Addendum)
 SATURATION QUALIFICATIONS: (This note is used to comply with regulatory documentation for home oxygen)  Patient Saturations on Room Air at Rest = 89%  Patient Saturations on Room Air while Ambulating = 85 %  Patient Saturations on 4 Liters of oxygen while Ambulating = 92 %  Please briefly explain why patient needs home oxygen: Patient ambulated around room on room air , oxygen level went from 89% to 85% Room air, applied 4 L via nasal cannula and patiently quickly recovered to 92% , patient will need home o2 as evidenced by above findings.

## 2024-08-27 ENCOUNTER — Telehealth: Payer: Self-pay

## 2024-08-27 NOTE — Transitions of Care (Post Inpatient/ED Visit) (Signed)
" ° °  08/27/2024  Name: Dawn Sherman MRN: [id number] DOB: 11/04/1955  Today's TOC FU Call Status: Today's TOC FU Call Status:: Unsuccessful Call (1st Attempt) Unsuccessful Call (1st Attempt) Date: 08/27/24  Attempted to reach the patient regarding the most recent Inpatient/ED visit.  Follow Up Plan: Additional outreach attempts will be made to reach the patient to complete the Transitions of Care (Post Inpatient/ED visit) call.   Shona Prow RN, CCM [place]  [facility]BC[facility]-Population Health RN Care Manager [phone]  "

## 2024-08-30 ENCOUNTER — Other Ambulatory Visit: Payer: Self-pay

## 2024-08-30 ENCOUNTER — Inpatient Hospital Stay
Admission: EM | Admit: 2024-08-30 | Discharge: 2024-09-04 | DRG: 189 | Disposition: A | Discharge status: Home / Self Care

## 2024-08-30 ENCOUNTER — Telehealth: Payer: Self-pay

## 2024-08-30 ENCOUNTER — Emergency Department

## 2024-08-30 DIAGNOSIS — N2581 Secondary hyperparathyroidism of renal origin: Secondary | ICD-10-CM | POA: Diagnosis present

## 2024-08-30 DIAGNOSIS — I4819 Other persistent atrial fibrillation: Secondary | ICD-10-CM | POA: Diagnosis present

## 2024-08-30 DIAGNOSIS — Z79890 Hormone replacement therapy (postmenopausal): Secondary | ICD-10-CM

## 2024-08-30 DIAGNOSIS — Z95818 Presence of other cardiac implants and grafts: Secondary | ICD-10-CM

## 2024-08-30 DIAGNOSIS — Z83438 Family history of other disorder of lipoprotein metabolism and other lipidemia: Secondary | ICD-10-CM

## 2024-08-30 DIAGNOSIS — I130 Hypertensive heart and chronic kidney disease with heart failure and stage 1 through stage 4 chronic kidney disease, or unspecified chronic kidney disease: Secondary | ICD-10-CM | POA: Diagnosis present

## 2024-08-30 DIAGNOSIS — Z881 Allergy status to other antibiotic agents status: Secondary | ICD-10-CM

## 2024-08-30 DIAGNOSIS — Z880 Allergy status to penicillin: Secondary | ICD-10-CM

## 2024-08-30 DIAGNOSIS — Z8261 Family history of arthritis: Secondary | ICD-10-CM

## 2024-08-30 DIAGNOSIS — N184 Chronic kidney disease, stage 4 (severe): Secondary | ICD-10-CM | POA: Diagnosis present

## 2024-08-30 DIAGNOSIS — E782 Mixed hyperlipidemia: Secondary | ICD-10-CM | POA: Diagnosis present

## 2024-08-30 DIAGNOSIS — E039 Hypothyroidism, unspecified: Secondary | ICD-10-CM | POA: Diagnosis present

## 2024-08-30 DIAGNOSIS — J9601 Acute respiratory failure with hypoxia: Secondary | ICD-10-CM | POA: Diagnosis not present

## 2024-08-30 DIAGNOSIS — I340 Nonrheumatic mitral (valve) insufficiency: Secondary | ICD-10-CM | POA: Diagnosis present

## 2024-08-30 DIAGNOSIS — R64 Cachexia: Secondary | ICD-10-CM | POA: Diagnosis present

## 2024-08-30 DIAGNOSIS — Z885 Allergy status to narcotic agent status: Secondary | ICD-10-CM

## 2024-08-30 DIAGNOSIS — D72829 Elevated white blood cell count, unspecified: Secondary | ICD-10-CM | POA: Diagnosis present

## 2024-08-30 DIAGNOSIS — F32A Depression, unspecified: Secondary | ICD-10-CM | POA: Diagnosis present

## 2024-08-30 DIAGNOSIS — Z882 Allergy status to sulfonamides status: Secondary | ICD-10-CM

## 2024-08-30 DIAGNOSIS — I5022 Chronic systolic (congestive) heart failure: Secondary | ICD-10-CM | POA: Diagnosis present

## 2024-08-30 DIAGNOSIS — N179 Acute kidney failure, unspecified: Secondary | ICD-10-CM | POA: Diagnosis present

## 2024-08-30 DIAGNOSIS — D631 Anemia in chronic kidney disease: Secondary | ICD-10-CM | POA: Diagnosis present

## 2024-08-30 DIAGNOSIS — F419 Anxiety disorder, unspecified: Secondary | ICD-10-CM | POA: Diagnosis present

## 2024-08-30 DIAGNOSIS — Z7901 Long term (current) use of anticoagulants: Secondary | ICD-10-CM

## 2024-08-30 DIAGNOSIS — J441 Chronic obstructive pulmonary disease with (acute) exacerbation: Principal | ICD-10-CM | POA: Diagnosis present

## 2024-08-30 DIAGNOSIS — E875 Hyperkalemia: Secondary | ICD-10-CM | POA: Diagnosis present

## 2024-08-30 DIAGNOSIS — Z79899 Other long term (current) drug therapy: Secondary | ICD-10-CM

## 2024-08-30 DIAGNOSIS — Z888 Allergy status to other drugs, medicaments and biological substances status: Secondary | ICD-10-CM

## 2024-08-30 DIAGNOSIS — R54 Age-related physical debility: Secondary | ICD-10-CM | POA: Diagnosis present

## 2024-08-30 DIAGNOSIS — I429 Cardiomyopathy, unspecified: Secondary | ICD-10-CM | POA: Diagnosis present

## 2024-08-30 DIAGNOSIS — Z87891 Personal history of nicotine dependence: Secondary | ICD-10-CM

## 2024-08-30 DIAGNOSIS — I10 Essential (primary) hypertension: Secondary | ICD-10-CM | POA: Diagnosis present

## 2024-08-30 LAB — BASIC METABOLIC PANEL WITH GFR
Anion gap: 16 — ABNORMAL HIGH (ref 5–15)
BUN: 69 mg/dL — ABNORMAL HIGH (ref 8–23)
CO2: 21 mmol/L — ABNORMAL LOW (ref 22–32)
Calcium: 10.5 mg/dL — ABNORMAL HIGH (ref 8.9–10.3)
Chloride: 100 mmol/L (ref 98–111)
Creatinine, Ser: 3.7 mg/dL — ABNORMAL HIGH (ref 0.44–1.00)
GFR, Estimated: 13 mL/min — ABNORMAL LOW
Glucose, Bld: 100 mg/dL — ABNORMAL HIGH (ref 70–99)
Potassium: 5.5 mmol/L — ABNORMAL HIGH (ref 3.5–5.1)
Sodium: 137 mmol/L (ref 135–145)

## 2024-08-30 LAB — CBC
HCT: 50.1 % — ABNORMAL HIGH (ref 36.0–46.0)
Hemoglobin: 15.8 g/dL — ABNORMAL HIGH (ref 12.0–15.0)
MCH: 29.9 pg (ref 26.0–34.0)
MCHC: 31.5 g/dL (ref 30.0–36.0)
MCV: 94.7 fL (ref 80.0–100.0)
Platelets: 311 K/uL (ref 150–400)
RBC: 5.29 MIL/uL — ABNORMAL HIGH (ref 3.87–5.11)
RDW: 13.4 % (ref 11.5–15.5)
WBC: 20.7 K/uL — ABNORMAL HIGH (ref 4.0–10.5)
nRBC: 0 % (ref 0.0–0.2)

## 2024-08-30 LAB — TROPONIN T, HIGH SENSITIVITY
Troponin T High Sensitivity: 25 ng/L — ABNORMAL HIGH (ref 0–19)
Troponin T High Sensitivity: 25 ng/L — ABNORMAL HIGH (ref 0–19)

## 2024-08-30 MED ORDER — ACETAMINOPHEN 325 MG PO TABS
650.0000 mg | ORAL_TABLET | Freq: Four times a day (QID) | ORAL | Status: DC | PRN
  Administered 2024-09-04: 650 mg via ORAL
  Filled 2024-08-30: qty 2

## 2024-08-30 MED ORDER — HYDROCODONE-ACETAMINOPHEN 5-325 MG PO TABS: 1.0000 | ORAL_TABLET | ORAL | Status: DC | PRN

## 2024-08-30 MED ORDER — METHYLPREDNISOLONE SODIUM SUCC 125 MG IJ SOLR
125.0000 mg | Freq: Once | INTRAMUSCULAR | Status: AC
  Administered 2024-08-30: 125 mg via INTRAVENOUS
  Filled 2024-08-30: qty 2

## 2024-08-30 MED ORDER — ALBUTEROL SULFATE (2.5 MG/3ML) 0.083% IN NEBU: 2.5000 mg | INHALATION_SOLUTION | RESPIRATORY_TRACT | Status: DC | PRN

## 2024-08-30 MED ORDER — METHYLPREDNISOLONE SODIUM SUCC 40 MG IJ SOLR
40.0000 mg | Freq: Two times a day (BID) | INTRAMUSCULAR | Status: AC
  Administered 2024-08-31 (×2): 40 mg via INTRAVENOUS
  Filled 2024-08-30 (×2): qty 1

## 2024-08-30 MED ORDER — AMIODARONE HCL 200 MG PO TABS
200.0000 mg | ORAL_TABLET | Freq: Every day | ORAL | Status: DC
  Administered 2024-08-31 – 2024-09-04 (×5): 200 mg via ORAL
  Filled 2024-08-30 (×5): qty 1

## 2024-08-30 MED ORDER — VENLAFAXINE HCL ER 75 MG PO CP24
150.0000 mg | ORAL_CAPSULE | Freq: Every day | ORAL | Status: DC
  Administered 2024-08-31 – 2024-09-04 (×5): 150 mg via ORAL
  Filled 2024-08-30: qty 1
  Filled 2024-08-30 (×3): qty 2
  Filled 2024-08-30: qty 1

## 2024-08-30 MED ORDER — GUAIFENESIN ER 600 MG PO TB12
600.0000 mg | ORAL_TABLET | Freq: Two times a day (BID) | ORAL | Status: DC
  Administered 2024-08-30 – 2024-09-04 (×10): 600 mg via ORAL
  Filled 2024-08-30 (×10): qty 1

## 2024-08-30 MED ORDER — ACETAMINOPHEN 650 MG RE SUPP: 650.0000 mg | Freq: Four times a day (QID) | RECTAL | Status: DC | PRN

## 2024-08-30 MED ORDER — CARVEDILOL 12.5 MG PO TABS
12.5000 mg | ORAL_TABLET | Freq: Two times a day (BID) | ORAL | Status: DC
  Administered 2024-08-31 – 2024-09-04 (×9): 12.5 mg via ORAL
  Filled 2024-08-30 (×3): qty 1
  Filled 2024-08-30 (×3): qty 2
  Filled 2024-08-30 (×2): qty 1
  Filled 2024-08-30: qty 2

## 2024-08-30 MED ORDER — IPRATROPIUM-ALBUTEROL 0.5-2.5 (3) MG/3ML IN SOLN
3.0000 mL | Freq: Once | RESPIRATORY_TRACT | Status: AC
  Administered 2024-08-30: 3 mL via RESPIRATORY_TRACT
  Filled 2024-08-30: qty 3

## 2024-08-30 MED ORDER — PREDNISONE 20 MG PO TABS
40.0000 mg | ORAL_TABLET | Freq: Every day | ORAL | Status: AC
  Administered 2024-09-01 – 2024-09-04 (×4): 40 mg via ORAL
  Filled 2024-08-30 (×4): qty 2

## 2024-08-30 MED ORDER — IPRATROPIUM-ALBUTEROL 0.5-2.5 (3) MG/3ML IN SOLN
3.0000 mL | Freq: Four times a day (QID) | RESPIRATORY_TRACT | Status: DC
  Administered 2024-08-31 – 2024-09-02 (×7): 3 mL via RESPIRATORY_TRACT
  Filled 2024-08-30 (×9): qty 3

## 2024-08-30 MED ORDER — DAPAGLIFLOZIN PROPANEDIOL 10 MG PO TABS
10.0000 mg | ORAL_TABLET | Freq: Every day | ORAL | Status: DC
  Administered 2024-08-31: 10 mg via ORAL
  Filled 2024-08-30: qty 1

## 2024-08-30 MED ORDER — BUPROPION HCL ER (XL) 150 MG PO TB24
150.0000 mg | ORAL_TABLET | Freq: Every day | ORAL | Status: DC
  Administered 2024-08-31 – 2024-09-04 (×5): 150 mg via ORAL
  Filled 2024-08-30 (×5): qty 1

## 2024-08-30 MED ORDER — ONDANSETRON HCL 4 MG PO TABS: 4.0000 mg | ORAL_TABLET | Freq: Four times a day (QID) | ORAL | Status: DC | PRN

## 2024-08-30 MED ORDER — LEVOTHYROXINE SODIUM 50 MCG PO TABS
75.0000 ug | ORAL_TABLET | Freq: Every day | ORAL | Status: DC
  Administered 2024-08-31 – 2024-09-04 (×5): 75 ug via ORAL
  Filled 2024-08-30 (×2): qty 1
  Filled 2024-08-30: qty 2
  Filled 2024-08-30 (×2): qty 1

## 2024-08-30 MED ORDER — APIXABAN 2.5 MG PO TABS
2.5000 mg | ORAL_TABLET | Freq: Two times a day (BID) | ORAL | Status: DC
  Administered 2024-08-30 – 2024-09-04 (×10): 2.5 mg via ORAL
  Filled 2024-08-30 (×12): qty 1

## 2024-08-30 MED ORDER — ONDANSETRON HCL 4 MG/2ML IJ SOLN: 4.0000 mg | Freq: Four times a day (QID) | INTRAMUSCULAR | Status: DC | PRN

## 2024-08-30 MED ORDER — TORSEMIDE 20 MG PO TABS
60.0000 mg | ORAL_TABLET | Freq: Every day | ORAL | Status: DC
  Administered 2024-08-31 – 2024-09-01 (×2): 60 mg via ORAL
  Filled 2024-08-30 (×2): qty 3

## 2024-08-30 NOTE — Telephone Encounter (Signed)
 Copied from CRM [id number]. Topic: General - Other >> Aug 30, 2024  2:19 PM Carlyon D wrote: Reason for CRM: Nurse rhonda with [facility] is calling in regards to pt, stated she believes pt needs to go to the hospital and is requesting to speak to someone directly in the office either Dr. Glendia or her nurse. Called [facility] explained what was going on was then put on hold for over 6 min. Nurse rhonda hung up

## 2024-08-30 NOTE — ED Triage Notes (Signed)
 Pt to ED via  [facility] from home. Pt reports continued SOB rib pain and productive cough. Pt seen on 12/27 and dx with Flu and was hypoxic and placed/discharged on 4L [place]

## 2024-08-30 NOTE — Assessment & Plan Note (Addendum)
 Leukocytosis of 20,000, suspect related to recent steroids in the treatment of COPD exacerbation Continue to monitor

## 2024-08-30 NOTE — ED Provider Notes (Signed)
 "  [facility] Provider Note    Event Date/Time   First MD Initiated Contact with Patient 08/30/24 1748     (approximate)   History   Shortness of Breath (/)   HPI  Dawn Sherman is a 69 y.o. female present with concern of increased work of breathing and shortness of breath.  Just recently discharged from our facility with COPD exacerbation as well as flu.  Since time of discharge, has been having gradual decline worsening shortness of breath and poor p.o. tolerance.  She has an albuterol  inhaler at home which she has been using with some relief of symptoms.  She was discharged home with about 4 L of oxygen which she has been using at all times.  Primary concern is that she becomes very tachypneic having trouble with ambulation.  While she was here she was given a brief course of steroids, but was not discharged home with any steroids it seems.      Physical Exam   Triage Vital Signs: ED Triage Vitals [08/30/24 1553]  Encounter Vitals Group     BP (!) 123/107     Girls Systolic BP Percentile      Girls Diastolic BP Percentile      Boys Systolic BP Percentile      Boys Diastolic BP Percentile      Pulse Rate (!) 104     Resp 20     Temp 98.1 F (36.7 C)     Temp Source Oral     SpO2 98 %     Weight      Height      Head Circumference      Peak Flow      Pain Score 5     Pain Loc      Pain Education      Exclude from Growth Chart     Most recent vital signs: Vitals:   08/30/24 1553 08/30/24 1830  BP: (!) 123/107 (!) 116/48  Pulse: (!) 104 (!) 101  Resp: 20 18  Temp: 98.1 F (36.7 C)   SpO2: 98% 95%     General: Awake, no distress.  CV:  Good peripheral perfusion.  Resp:  Tachypneic with evidence of increased work of breathing, poor air movement, and frequently coughing in the exam room Abd:  No distention.  Soft nontender Other:     ED Results / Procedures / Treatments   Labs (all labs ordered are listed, but only abnormal  results are displayed) Labs Reviewed  BASIC METABOLIC PANEL WITH GFR - Abnormal; Notable for the following components:      Result Value   Potassium 5.5 (*)    CO2 21 (*)    Glucose, Bld 100 (*)    BUN 69 (*)    Creatinine, Ser 3.70 (*)    Calcium  10.5 (*)    GFR, Estimated 13 (*)    Anion gap 16 (*)    All other components within normal limits  CBC - Abnormal; Notable for the following components:   WBC 20.7 (*)    RBC 5.29 (*)    Hemoglobin 15.8 (*)    HCT 50.1 (*)    All other components within normal limits  TROPONIN T, HIGH SENSITIVITY - Abnormal; Notable for the following components:   Troponin T High Sensitivity 25 (*)    All other components within normal limits  TROPONIN T, HIGH SENSITIVITY - Abnormal; Notable for the following components:   Troponin T  High Sensitivity 25 (*)    All other components within normal limits     EKG  Appears to be a sinus rhythm with rate of about 100, axis of 70, multiple nonspecific T wave inversions noted in leads V4, V5, V6 as well as 2-3 aVF, no noted ST elevation   RADIOLOGY Significant emphysematous changes, no acute cardiopulmonary findings  PROCEDURES:  Critical Care performed: Yes, see critical care procedure note(s)  .Critical Care  Performed by: Fernand Rossie HERO, MD Authorized by: Fernand Rossie HERO, MD   Critical care provider statement:    Critical care time (minutes):  30   Critical care was necessary to treat or prevent imminent or life-threatening deterioration of the following conditions:  Respiratory failure   Critical care was time spent personally by me on the following activities:  Development of treatment plan with patient or surrogate, evaluation of patient's response to treatment, examination of patient, ordering and review of laboratory studies, ordering and review of radiographic studies, ordering and performing treatments and interventions, pulse oximetry, re-evaluation of patient's condition and review of  old charts   Care discussed with: admitting provider      MEDICATIONS ORDERED IN ED: Medications  ipratropium-albuterol  (DUONEB) 0.5-2.5 (3) MG/3ML nebulizer solution 3 mL (3 mLs Nebulization Given 08/30/24 1831)  methylPREDNISolone  sodium succinate (SOLU-MEDROL ) 125 mg/2 mL injection 125 mg (125 mg Intravenous Given 08/30/24 1831)  ipratropium-albuterol  (DUONEB) 0.5-2.5 (3) MG/3ML nebulizer solution 3 mL (3 mLs Nebulization Given 08/30/24 1902)     IMPRESSION / MDM / ASSESSMENT AND PLAN / ED COURSE  I reviewed the triage vital signs and the nursing notes.                               Patient's presentation is most consistent with acute presentation with potential threat to life or bodily function.  69 year old female who presents today with concern of shortness of breath.  Clinically appears consistent with a COPD exacerbation frequently coughing in the exam room, on her 4 L of oxygen which she was discharged home with.  She does have a slight hyperkalemia, significant leukocytosis which may be from her recent steroid usage but not entirely sure.  Does not have any focal symptoms of an infection, and chest x-ray without evidence of pneumonia.  Will give a nebulizer here and also recurrent steroids, she is also developed a slight AKI from her baseline kidney function it seems, she does tell me she is on the transplant list but she is not getting dialysis yet.  Will likely warrant admission for COPD exacerbation and worsening kidney disease.   Clinical Course as of 08/30/24 1947  Mon Aug 30, 2024  8148 Patient continues with significant tachypnea after nebulizer, she does sound much improved at this time however.  Will try second nebulizer and reassess. [SK]  1922 Patient continues with significant tachypnea, she is still saturating appropriately on her baseline nasal cannula, however I wonder given this she would benefit further from high flow or some other positive pressure.  Given the  increased work of breathing and significant tachypnea, suspect likely warrants admission will reach out to medicine at this time. [SK]  70 Spoke with Dr. Cleatus from medicine team who has agreed to evaluate the patient to determine course of further medical management. [SK]    Clinical Course User Index [SK] Fernand Rossie HERO, MD     FINAL CLINICAL IMPRESSION(S) / ED DIAGNOSES  Final diagnoses:  COPD exacerbation (HCC)  Hyperkalemia     Rx / DC Orders   ED Discharge Orders     None        Note:  This document was prepared using Dragon voice recognition software and may include unintentional dictation errors.   Fernand Rossie HERO, MD 08/30/24 1948  "

## 2024-08-30 NOTE — Assessment & Plan Note (Signed)
 Continue Eliquis , carvedilol  and amiodarone 

## 2024-08-30 NOTE — Assessment & Plan Note (Deleted)
 Clinically euvolemic

## 2024-08-30 NOTE — Assessment & Plan Note (Signed)
 At baseline

## 2024-08-30 NOTE — H&P (Signed)
 " History and Physical    Patient: Dawn Sherman FMW:[id number] DOB: [birth year] DOA: 08/30/2024 DOS: the patient was seen and examined on 08/30/2024 PCP: Glendia Shad, MD  Patient coming from: Home  Chief Complaint:  Chief Complaint  Patient presents with   Shortness of Breath         HPI: KAYLENA PACIFICO is a 69 y.o. female with medical history significant for COPD Gold stage III, persistent A-fib status post Watchman procedure, on Eliquis , HTN, HFrEF, CKD stage IV, anxiety/depression, recently hospitalized just a week ago (12/27 to 08/25/24) with influenza A / COPD / acute respiratory failure, discharged on 4 L, being admitted with ongoing symptoms of COPD exacerbation and failure to improve with home treatment  In the ED she was tachycardic to 104 and tachypneic to the 20s.  Initially with sats in the mid 90s on 4 L but due to the increased work of breathing with minimal exertion we will simply speaking, she was transitioned to high flow nasal cannula at 40 L/min  Labs notable for leukocytosis of 20,000, probably from recent steroid use.  Troponin 25, mild hyperkalemia 5.5.EKG showed sinus tachycardia at 104 and chest x-ray was nonacute.  Chronic coarse markings without pulmonary edema and hyperinflation  Patient treated with DuoNebs and steroids Admission requested     Past Medical History:  Diagnosis Date   B12 deficiency    Cardiomyopathy (HCC)    a. 09/2022 Echo: EF 35-40%; b. 09/2022 MV: apical defect w/ mild peri-infarct ischemia. EF 43%.   Chronic HFrEF (heart failure with reduced ejection fraction) (HCC)    a. 01/2020 Echo: EF 30-35%; b. 07/2020 Echo: EF 50-55%; c. 06/2022 Echo: EF 50-55%; d. 09/2022 Echo: EF 35-40%, globh HK, mod reduced RV fxn, sev BAE, mod MR.   CKD (chronic kidney disease), stage IV (HCC)    COPD (chronic obstructive pulmonary disease) (HCC)    Depression    Endometriosis    Hypertension    Mixed hyperlipidemia    Moderate mitral regurgitation     Normocytic anemia    PAF (paroxysmal atrial fibrillation) (HCC)    a. CHA2DS2VASc = 4-->eliquis  2.5 bid.   Paroxymsal Atrial flutter (HCC)    Tobacco abuse    Past Surgical History:  Procedure Laterality Date   ABDOMINAL HYSTERECTOMY     ATRIAL FIBRILLATION ABLATION N/A 08/04/2023   Procedure: ATRIAL FIBRILLATION ABLATION;  Surgeon: Cindie Ole DASEN, MD;  Location: [facility];  Service: Cardiovascular;  Laterality: N/A;   BREAST BIOPSY Right 2015   neg-  FIBROADENOMA   CENTRAL LINE INSERTION  11/15/2022   Procedure: CENTRAL LINE INSERTION;  Surgeon: Mady Bruckner, MD;  Location: [facility] INVASIVE CV LAB;  Service: Cardiovascular;;   COLONOSCOPY WITH PROPOFOL  N/A 11/24/2022   Procedure: COLONOSCOPY WITH PROPOFOL ;  Surgeon: Maryruth Ole DASEN, MD;  Location: [facility] ENDOSCOPY;  Service: Endoscopy;  Laterality: N/A;   COLONOSCOPY WITH PROPOFOL  N/A 11/23/2022   Procedure: COLONOSCOPY WITH PROPOFOL ;  Surgeon: Maryruth Ole DASEN, MD;  Location: [facility] ENDOSCOPY;  Service: Endoscopy;  Laterality: N/A;   ESOPHAGOGASTRODUODENOSCOPY (EGD) WITH PROPOFOL  N/A 11/23/2022   Procedure: ESOPHAGOGASTRODUODENOSCOPY (EGD) WITH PROPOFOL ;  Surgeon: Maryruth Ole DASEN, MD;  Location: [facility] ENDOSCOPY;  Service: Endoscopy;  Laterality: N/A;   LEFT ATRIAL APPENDAGE OCCLUSION N/A 05/27/2024   Procedure: LEFT ATRIAL APPENDAGE OCCLUSION;  Surgeon: Cindie Ole DASEN, MD;  Location: [facility];  Service: Cardiovascular;  Laterality: N/A;   RIGHT HEART CATH N/A 11/15/2022   Procedure: RIGHT HEART CATH;  Surgeon: Mady Bruckner, MD;  Location: [facility] INVASIVE CV LAB;  Service: Cardiovascular;  Laterality: N/A;   RIGHT/LEFT HEART CATH AND CORONARY ANGIOGRAPHY N/A 11/25/2022   Procedure: RIGHT/LEFT HEART CATH AND CORONARY ANGIOGRAPHY;  Surgeon: Darron Deatrice LABOR, MD;  Location: [facility] INVASIVE CV LAB;  Service: Cardiovascular;  Laterality: N/A;   TAH/RSO  1999   secondary to bleeding and endometriosis (Dr Trula)    TRANSESOPHAGEAL ECHOCARDIOGRAM (CATH LAB) N/A 08/04/2023   Procedure: TRANSESOPHAGEAL ECHOCARDIOGRAM;  Surgeon: Cindie Ole DASEN, MD;  Location: [facility] INVASIVE CV LAB;  Service: Cardiovascular;  Laterality: N/A;   TRANSESOPHAGEAL ECHOCARDIOGRAM (CATH LAB) N/A 05/27/2024   Procedure: TRANSESOPHAGEAL ECHOCARDIOGRAM;  Surgeon: Cindie Ole DASEN, MD;  Location: [facility] INVASIVE CV LAB;  Service: Cardiovascular;  Laterality: N/A;   Social History:  reports that she quit smoking about 2 years ago. Her smoking use included cigarettes. She started smoking about 51 years ago. She has a 24.5 pack-year smoking history. She has never used smokeless tobacco. She reports that she does not currently use alcohol. She reports that she does not use drugs.  Allergies[1]  Family History  Problem Relation Age of Onset   Hypercholesterolemia Mother    Rheum arthritis Father    Rheum arthritis Daughter    Fibromyalgia Daughter    Breast cancer Neg Hx    Colon cancer Neg Hx     Prior to Admission medications  Medication Sig Start Date End Date Taking? Authorizing Provider  acetaminophen  (TYLENOL ) 325 MG tablet Take 650 mg by mouth every 6 (six) hours as needed for mild pain (pain score 1-3) or moderate pain (pain score 4-6) (pain.).   Yes [provider]  albuterol  (VENTOLIN  HFA) 108 (90 Base) MCG/ACT inhaler Inhale 2 puffs into the lungs every 6 (six) hours as needed for wheezing or shortness of breath. 02/19/24  Yes Glendia Shad, MD  amiodarone  (PACERONE ) 200 MG tablet Take 200 mg by mouth daily.   Yes [provider]  amLODipine  (NORVASC ) 10 MG tablet Take 10 mg by mouth daily.   Yes [provider]  apixaban  (ELIQUIS ) 2.5 MG TABS tablet Take 1 tablet (2.5 mg total) by mouth 2 (two) times daily. 04/29/24  Yes Wonda Sharper, MD  atorvastatin  (LIPITOR) 40 MG tablet TAKE ONE TABLET BY MOUTH EVERY DAY 04/05/24  Yes Glendia Shad, MD  buPROPion  (WELLBUTRIN  XL) 150 MG 24 hr tablet Take 1  tablet (150 mg total) by mouth daily. 02/19/24  Yes Glendia Shad, MD  calcitRIOL  (ROCALTROL ) 0.25 MCG capsule Take 0.25 mcg by mouth daily.   Yes [provider]  calcium  carbonate (OS-CAL - DOSED IN MG OF ELEMENTAL CALCIUM ) 1250 (500 Ca) MG tablet Take 1 tablet (500 mg of elemental calcium  total) by mouth 3 (three) times daily with meals. 12/14/21  Yes Patel, Sona, MD  carvedilol  (COREG ) 12.5 MG tablet Take 12.5 mg by mouth 2 (two) times daily with a meal.   Yes [provider]  FARXIGA  10 MG TABS tablet Take 1 tablet (10 mg total) by mouth daily. 03/24/24  Yes Glendia Shad, MD  hydrALAZINE  (APRESOLINE ) 25 MG tablet Take 1 tablet (25 mg total) by mouth 3 (three) times daily. 05/06/24  Yes Rolan Ezra RAMAN, MD  isosorbide  mononitrate (IMDUR ) 30 MG 24 hr tablet Take 1 tablet (30 mg total) by mouth daily. 05/06/24 08/30/24 Yes Rolan Ezra RAMAN, MD  levothyroxine  (SYNTHROID ) 75 MCG tablet TAKE ONE TABLET BY MOUTH DAILY BEFORE BREAKFAST 08/02/24  Yes Glendia Shad, MD  loratadine  (CLARITIN ) 10  MG tablet Take 10 mg by mouth daily.   Yes [provider]  omeprazole  (PRILOSEC) 20 MG capsule Take 1 capsule (20 mg total) by mouth daily. 09/18/23  Yes Riddle, Suzann, NP  torsemide  (DEMADEX ) 20 MG tablet Take 3 tablets (60 mg total) by mouth daily. Take 60 mg alternating with 40 mg every other day 08/25/24  Yes Jens Durand, MD  venlafaxine  XR (EFFEXOR -XR) 150 MG 24 hr capsule TAKE ONE CAPSULE BY MOUTH EVERY DAY 05/03/24  Yes Glendia Shad, MD  umeclidinium-vilanterol (ANORO ELLIPTA ) 62.5-25 MCG/ACT AEPB Inhale 1 puff into the lungs daily. Patient not taking: Reported on 05/31/2024 05/31/24   Isadora Hose, MD    Physical Exam: Vitals:   08/30/24 1553 08/30/24 1830 08/30/24 2010  BP: (!) 123/107 (!) 116/48   Pulse: (!) 104 (!) 101   Resp: 20 18   Temp: 98.1 F (36.7 C)  98.3 F (36.8 C)  TempSrc: Oral  Oral  SpO2: 98% 95%    Physical Exam Vitals and nursing note reviewed.   Constitutional:      General: She is not in acute distress.    Comments: Dyspneic, speaking in short sentences  HENT:     Head: Normocephalic and atraumatic.  Cardiovascular:     Rate and Rhythm: Regular rhythm. Tachycardia present.     Heart sounds: Normal heart sounds.  Pulmonary:     Effort: Tachypnea present.     Breath sounds: Wheezing present.  Abdominal:     Palpations: Abdomen is soft.     Tenderness: There is no abdominal tenderness.  Neurological:     Mental Status: Mental status is at baseline.     Labs on Admission: I have personally reviewed following labs and imaging studies  CBC: Recent Labs  Lab 08/24/24 1051 08/30/24 1557  WBC 13.1* 20.7*  NEUTROABS 11.7*  --   HGB 15.0 15.8*  HCT 46.1* 50.1*  MCV 93.1 94.7  PLT 163 311   Basic Metabolic Panel: Recent Labs  Lab 08/24/24 1051 08/30/24 1557  NA 140 137  K 5.0 5.5*  CL 103 100  CO2 23 21*  GLUCOSE 150* 100*  BUN 75* 69*  CREATININE 3.18* 3.70*  CALCIUM  9.4 10.5*   GFR: Estimated Creatinine Clearance: 10.3 mL/min (A) (by C-G formula based on SCr of 3.7 mg/dL (H)). Liver Function Tests: No results for input(s): AST, ALT, ALKPHOS, BILITOT, PROT, ALBUMIN  in the last 168 hours. No results for input(s): LIPASE, AMYLASE in the last 168 hours. No results for input(s): AMMONIA in the last 168 hours. Coagulation Profile: No results for input(s): INR, PROTIME in the last 168 hours. Cardiac Enzymes: No results for input(s): CKTOTAL, CKMB, CKMBINDEX, TROPONINI in the last 168 hours. BNP (last 3 results) No results for input(s): PROBNP in the last 8760 hours. HbA1C: No results for input(s): HGBA1C in the last 72 hours. CBG: No results for input(s): GLUCAP in the last 168 hours. Lipid Profile: No results for input(s): CHOL, HDL, LDLCALC, TRIG, CHOLHDL, LDLDIRECT in the last 72 hours. Thyroid  Function Tests: No results for input(s): TSH, T4TOTAL,  FREET4, T3FREE, THYROIDAB in the last 72 hours. Anemia Panel: No results for input(s): VITAMINB12, FOLATE, FERRITIN, TIBC, IRON, RETICCTPCT in the last 72 hours. Urine analysis:    Component Value Date/Time   COLORURINE YELLOW (A) 12/07/2023 1523   APPEARANCEUR CLEAR (A) 12/07/2023 1523   LABSPEC 1.013 12/07/2023 1523   PHURINE 5.0 12/07/2023 1523   GLUCOSEU NEGATIVE 12/07/2023 1523   GLUCOSEU NEGATIVE 01/22/2017 1052  HGBUR NEGATIVE 12/07/2023 1523   BILIRUBINUR NEGATIVE 12/07/2023 1523   KETONESUR NEGATIVE 12/07/2023 1523   PROTEINUR 100 (A) 12/07/2023 1523   UROBILINOGEN 0.2 01/22/2017 1052   NITRITE NEGATIVE 12/07/2023 1523   LEUKOCYTESUR SMALL (A) 12/07/2023 1523    Radiological Exams on Admission: DG Chest 2 View Result Date: 08/30/2024 EXAM: 2 VIEW(S) XRAY OF THE CHEST 08/30/2024 04:19:00 PM COMPARISON: 08/21/2024 CLINICAL HISTORY: sob FINDINGS: LUNGS AND PLEURA: Lung hyperinflation. Chronic coarsened markings without pulmonary edema. No pleural effusion. No pneumothorax. HEART AND MEDIASTINUM: No acute abnormality of the cardiac and mediastinal silhouettes. BONES AND SOFT TISSUES: Bilateral healed rib fractures. Scoliosis. IMPRESSION: 1. No acute findings. 2. Lung hyperinflation and chronic coarsened markings without pulmonary edema. 3. Bilateral healed rib fractures and scoliosis. Electronically signed by: Greig Pique MD 08/30/2024 05:12 PM EST RP Workstation: [id number]   Data Reviewed for HPI: Relevant notes from primary care and specialist visits, past discharge summaries as available in EHR, including Care Everywhere. Prior diagnostic testing as pertinent to current admission diagnoses Updated medications and problem lists for reconciliation ED course, including vitals, labs, imaging, treatment and response to treatment Triage notes, nursing and pharmacy notes and ED provider's notes Notable results as noted above in HPI      Assessment and  Plan: COPD with acute exacerbation (HCC) Acute respiratory failure with hypoxia Recent influenza A 08/21/2024 Recently discharged with a new O2 requirement of 4 L, now requiring HFNC due to increased work of breathing Schedule and as needed nebulized bronchodilators IV steroids Antitussives, flutter valve Wean O2 as tolerated  Chronic HFrEF (heart failure with reduced ejection fraction) (HCC) Clinically euvolemic Continue GDMT  Leukocytosis Leukocytosis of 20,000, suspect related to recent steroids in the treatment of COPD exacerbation Continue to monitor  Persistent atrial fibrillation s/p Watchman procedure (HCC) Continue Eliquis , carvedilol  and amiodarone   Essential hypertension Continue home meds  CKD (chronic kidney disease), stage IV (HCC) At baseline  Hypothyroidism Continue home meds    DVT prophylaxis: Eliquis   Consults: none  Advance Care Planning:   Code Status: Prior   Family Communication: none  Disposition Plan: Back to previous home environment  Severity of Illness: The appropriate patient status for this patient is OBSERVATION. Observation status is judged to be reasonable and necessary in order to provide the required intensity of service to ensure the patient's safety. The patient's presenting symptoms, physical exam findings, and initial radiographic and laboratory data in the context of their medical condition is felt to place them at decreased risk for further clinical deterioration. Furthermore, it is anticipated that the patient will be medically stable for discharge from the hospital within 2 midnights of admission.   Author: Delayne LULLA Solian, MD 08/30/2024 10:12 PM  For on call review [email or web address].      [1]  Allergies Allergen Reactions   Sulfate Rash   Codeine Sulfate Nausea Only   Benicar [Olmesartan]     Talked with patient February 10, 2020, intolerance is unclear, tried several medications around that time and one of them gave her a  rash but she is not clear which 1.   Amoxicillin Rash   Clindamycin Dermatitis   Clindamycin/Lincomycin Rash   Entresto  [Sacubitril -Valsartan ] Other (See Comments)    hyperkalemia   Morphine And Codeine Rash   Penicillins Rash   "

## 2024-08-30 NOTE — Assessment & Plan Note (Signed)
Clinically euvolemic Continue GDMT

## 2024-08-30 NOTE — Assessment & Plan Note (Signed)
"-   Continue home meds  "

## 2024-08-30 NOTE — Assessment & Plan Note (Addendum)
 Acute respiratory failure with hypoxia Recent influenza A 08/21/2024 Recently discharged with a new O2 requirement of 4 L, now requiring HFNC due to increased work of breathing Schedule and as needed nebulized bronchodilators IV steroids Antitussives, flutter valve Wean O2 as tolerated

## 2024-08-30 NOTE — Transitions of Care (Post Inpatient/ED Visit) (Signed)
 Patient ID: Dawn Sherman, female   DOB: May 04, 1956, 69 y.o.   MRN: [id number] 08/30/2024 Inbound call received from patient's husband stating patient is worse - then spoke with patient/spouse who report patient is worse and hasn't been out of bed other than going to bathroom since discharge - pain with deep breaths - pain when sitting/unable to sit and husband feels he can't transport her to MD or ED - call to PCP office and awaited call pick up, while waiting patient reported left arm pain and when asked said she feels she needs to go to ED ended call with PCP office - called 911 for ambulance  Shona Prow RN, CCM [facility]  VBCI-Population Health RN Care Manager [phone]

## 2024-08-31 DIAGNOSIS — E782 Mixed hyperlipidemia: Secondary | ICD-10-CM | POA: Diagnosis present

## 2024-08-31 DIAGNOSIS — Z79899 Other long term (current) drug therapy: Secondary | ICD-10-CM | POA: Diagnosis not present

## 2024-08-31 DIAGNOSIS — R54 Age-related physical debility: Secondary | ICD-10-CM | POA: Diagnosis present

## 2024-08-31 DIAGNOSIS — I429 Cardiomyopathy, unspecified: Secondary | ICD-10-CM | POA: Diagnosis present

## 2024-08-31 DIAGNOSIS — Z83438 Family history of other disorder of lipoprotein metabolism and other lipidemia: Secondary | ICD-10-CM | POA: Diagnosis not present

## 2024-08-31 DIAGNOSIS — E875 Hyperkalemia: Secondary | ICD-10-CM | POA: Diagnosis present

## 2024-08-31 DIAGNOSIS — R0602 Shortness of breath: Secondary | ICD-10-CM | POA: Diagnosis present

## 2024-08-31 DIAGNOSIS — Z7901 Long term (current) use of anticoagulants: Secondary | ICD-10-CM | POA: Diagnosis not present

## 2024-08-31 DIAGNOSIS — N179 Acute kidney failure, unspecified: Secondary | ICD-10-CM | POA: Diagnosis present

## 2024-08-31 DIAGNOSIS — I340 Nonrheumatic mitral (valve) insufficiency: Secondary | ICD-10-CM | POA: Diagnosis present

## 2024-08-31 DIAGNOSIS — F32A Depression, unspecified: Secondary | ICD-10-CM | POA: Diagnosis present

## 2024-08-31 DIAGNOSIS — I4819 Other persistent atrial fibrillation: Secondary | ICD-10-CM | POA: Diagnosis present

## 2024-08-31 DIAGNOSIS — R64 Cachexia: Secondary | ICD-10-CM | POA: Diagnosis present

## 2024-08-31 DIAGNOSIS — J441 Chronic obstructive pulmonary disease with (acute) exacerbation: Secondary | ICD-10-CM | POA: Diagnosis present

## 2024-08-31 DIAGNOSIS — F419 Anxiety disorder, unspecified: Secondary | ICD-10-CM | POA: Diagnosis present

## 2024-08-31 DIAGNOSIS — N2581 Secondary hyperparathyroidism of renal origin: Secondary | ICD-10-CM | POA: Diagnosis present

## 2024-08-31 DIAGNOSIS — I130 Hypertensive heart and chronic kidney disease with heart failure and stage 1 through stage 4 chronic kidney disease, or unspecified chronic kidney disease: Secondary | ICD-10-CM | POA: Diagnosis present

## 2024-08-31 DIAGNOSIS — Z87891 Personal history of nicotine dependence: Secondary | ICD-10-CM | POA: Diagnosis not present

## 2024-08-31 DIAGNOSIS — N184 Chronic kidney disease, stage 4 (severe): Secondary | ICD-10-CM | POA: Diagnosis present

## 2024-08-31 DIAGNOSIS — Z8261 Family history of arthritis: Secondary | ICD-10-CM | POA: Diagnosis not present

## 2024-08-31 DIAGNOSIS — D631 Anemia in chronic kidney disease: Secondary | ICD-10-CM | POA: Diagnosis present

## 2024-08-31 DIAGNOSIS — Z79890 Hormone replacement therapy (postmenopausal): Secondary | ICD-10-CM | POA: Diagnosis not present

## 2024-08-31 DIAGNOSIS — I5022 Chronic systolic (congestive) heart failure: Secondary | ICD-10-CM | POA: Diagnosis present

## 2024-08-31 DIAGNOSIS — J9601 Acute respiratory failure with hypoxia: Secondary | ICD-10-CM | POA: Diagnosis present

## 2024-08-31 DIAGNOSIS — E039 Hypothyroidism, unspecified: Secondary | ICD-10-CM | POA: Diagnosis present

## 2024-08-31 LAB — CBC
HCT: 48.6 % — ABNORMAL HIGH (ref 36.0–46.0)
Hemoglobin: 15.4 g/dL — ABNORMAL HIGH (ref 12.0–15.0)
MCH: 30.1 pg (ref 26.0–34.0)
MCHC: 31.7 g/dL (ref 30.0–36.0)
MCV: 95.1 fL (ref 80.0–100.0)
Platelets: 246 K/uL (ref 150–400)
RBC: 5.11 MIL/uL (ref 3.87–5.11)
RDW: 13.5 % (ref 11.5–15.5)
WBC: 23.2 K/uL — ABNORMAL HIGH (ref 4.0–10.5)
nRBC: 0 % (ref 0.0–0.2)

## 2024-08-31 LAB — BASIC METABOLIC PANEL WITH GFR
Anion gap: 19 — ABNORMAL HIGH (ref 5–15)
BUN: 88 mg/dL — ABNORMAL HIGH (ref 8–23)
CO2: 21 mmol/L — ABNORMAL LOW (ref 22–32)
Calcium: 10.2 mg/dL (ref 8.9–10.3)
Chloride: 96 mmol/L — ABNORMAL LOW (ref 98–111)
Creatinine, Ser: 4.48 mg/dL — ABNORMAL HIGH (ref 0.44–1.00)
GFR, Estimated: 10 mL/min — ABNORMAL LOW
Glucose, Bld: 139 mg/dL — ABNORMAL HIGH (ref 70–99)
Potassium: 5.3 mmol/L — ABNORMAL HIGH (ref 3.5–5.1)
Sodium: 136 mmol/L (ref 135–145)

## 2024-08-31 MED ORDER — DEXTROSE 5 % IV SOLN
250.0000 mg | INTRAVENOUS | Status: DC
  Filled 2024-08-31: qty 2.5

## 2024-08-31 MED ORDER — AZITHROMYCIN 250 MG PO TABS
500.0000 mg | ORAL_TABLET | Freq: Every day | ORAL | Status: DC
  Administered 2024-09-01 – 2024-09-04 (×4): 500 mg via ORAL
  Filled 2024-08-31: qty 1
  Filled 2024-08-31 (×3): qty 2

## 2024-08-31 MED ORDER — SODIUM CHLORIDE 0.9 % IV SOLN
500.0000 mg | Freq: Every day | INTRAVENOUS | Status: DC
  Administered 2024-08-31: 500 mg via INTRAVENOUS
  Filled 2024-08-31: qty 5

## 2024-08-31 NOTE — ED Notes (Signed)
 IV zithromax  requested from pharm

## 2024-08-31 NOTE — ED Notes (Signed)
 Patient assisted to use bedside commode. Patient able to stand and pivot without assistance. Patient urinated and returned to bed. Patient denies other needs at this time. Bed in lowest position, call bell within reach, and bed alarm applied.

## 2024-08-31 NOTE — Progress Notes (Signed)
 " PROGRESS NOTE Dawn Sherman    DOB: 10/23/1955, 69 y.o.  FMW:[id number]    Code Status: Full Code   DOA: 08/30/2024   LOS: 0  Brief hospital course  Dawn Sherman is a 69 y.o. female with a PMH significant for COPD Gold stage III, persistent A-fib status post Watchman procedure, on Eliquis , HTN, HFrEF, CKD stage IV, anxiety/depression, recently hospitalized (12/27 to 08/25/24) with influenza A / COPD / acute respiratory failure, discharged on 4 L. Prior to this she only wore oxygen at night.   In the ED she was tachycardic to 104 and tachypneic to the 20s.  Initially with sats in the mid 90s on 4 L but due to the increased work of breathing with minimal exertion we will simply speaking, she was transitioned to high flow nasal cannula at 40 L/min   Labs notable for leukocytosis of 20,000, probably from recent steroid use.  Troponin 25, mild hyperkalemia 5.5.EKG showed sinus tachycardia at 104 and chest x-ray was nonacute.  Chronic coarse markings without pulmonary edema and hyperinflation   Patient treated with DuoNebs and steroids  Patient was admitted to medicine service for further workup and management of COPD exacerbation and possible bacterial PNA post-viral as outlined in detail below.  08/31/2024 -symptomatically improved.   Assessment & Plan  Principal Problem:   Acute hypoxemic respiratory failure (HCC) Active Problems:   COPD with acute exacerbation (HCC)   Leukocytosis   Chronic HFrEF (heart failure with reduced ejection fraction) (HCC)   Persistent atrial fibrillation s/p Watchman procedure (HCC)   Essential hypertension   CKD (chronic kidney disease), stage IV (HCC)   Hypothyroidism  COPD with acute exacerbation- currently on 4L HHFNC Acute respiratory failure with hypoxia Recent influenza A 08/21/2024- no acute consolidations seen on xray but can lead to secondary bacterial pneumonia  - completed tamiflu previously - wean O2 to 4L as tolerated as that is what she was  previously discharged on - continue steroids- converted to PO - azithromycin  for increased sputum production  - bronchodilators PRN. Wheezing appreciated on exam today Antitussives, flutter valve - tomorrow ambulate with pulse ox   Chronic HFrEF- Clinically euvolemic Continue GDMT as able - home torsemide  daily   Persistent atrial fibrillation s/p Watchman procedure (HCC) Continue Eliquis , carvedilol  and amiodarone    Essential hypertension Continue home meds   CKD (chronic kidney disease), stage IV (HCC) At baseline   Hypothyroidism Continue home levothyroxine    There is no height or weight on file to calculate BMI.  VTE ppx: apixaban  (ELIQUIS ) tablet 2.5 mg Start: 08/30/24 2230 apixaban  (ELIQUIS ) tablet 2.5 mg   Diet:     Diet   Diet Heart Room service appropriate? Yes; Fluid consistency: Thin   Consultants: None   Subjective 08/31/2024    Pt reports feeling improved since admission.    Objective  Blood pressure (!) 141/57, pulse 97, temperature 98.3 F (36.8 C), temperature source Oral, resp. rate 15, SpO2 95%. No intake or output data in the 24 hours ending 08/31/24 0816 There were no vitals filed for this visit.   Physical Exam:  General: awake, alert, NAD. Frail.  Respiratory: normal respiratory effort. Mild inspiratory wheezing Cardiovascular: extremities well perfused, quick capillary refill, normal S1/S2, RRR, no JVD, murmurs Nervous: A&O x3. no gross focal neurologic deficits, normal speech Extremities: moves all equally, no edema, normal tone Skin: dry, intact, normal temperature, normal color. No rashes, lesions or ulcers on exposed skin Psychiatry: normal mood, congruent affect  Labs  I have personally reviewed the following labs and imaging studies CBC    Component Value Date/Time   WBC 20.7 (H) 08/30/2024 1557   RBC 5.29 (H) 08/30/2024 1557   HGB 15.8 (H) 08/30/2024 1557   HGB 14.4 05/10/2024 0922   HGB 13.1 04/12/2024 1001   HCT 50.1 (H)  08/30/2024 1557   HCT 41.8 04/12/2024 1001   PLT 311 08/30/2024 1557   PLT 148 (L) 05/10/2024 0922   PLT 162 04/12/2024 1001   MCV 94.7 08/30/2024 1557   MCV 97 04/12/2024 1001   MCH 29.9 08/30/2024 1557   MCHC 31.5 08/30/2024 1557   RDW 13.4 08/30/2024 1557   RDW 13.0 04/12/2024 1001   LYMPHSABS 0.4 (L) 08/24/2024 1051   MONOABS 0.8 08/24/2024 1051   EOSABS 0.0 08/24/2024 1051   BASOSABS 0.0 08/24/2024 1051      Latest Ref Rng & Units 08/30/2024    3:57 PM 08/24/2024   10:51 AM 08/21/2024    1:23 PM  BMP  Glucose 70 - 99 mg/dL 899  849  881   BUN 8 - 23 mg/dL 69  75  33   Creatinine 0.44 - 1.00 mg/dL 6.29  6.81  7.15   Sodium 135 - 145 mmol/L 137  140  139   Potassium 3.5 - 5.1 mmol/L 5.5  5.0  4.9   Chloride 98 - 111 mmol/L 100  103  101   CO2 22 - 32 mmol/L 21  23  23    Calcium  8.9 - 10.3 mg/dL 89.4  9.4  9.1     DG Chest 2 View Result Date: 08/30/2024 EXAM: 2 VIEW(S) XRAY OF THE CHEST 08/30/2024 04:19:00 PM COMPARISON: 08/21/2024 CLINICAL HISTORY: sob FINDINGS: LUNGS AND PLEURA: Lung hyperinflation. Chronic coarsened markings without pulmonary edema. No pleural effusion. No pneumothorax. HEART AND MEDIASTINUM: No acute abnormality of the cardiac and mediastinal silhouettes. BONES AND SOFT TISSUES: Bilateral healed rib fractures. Scoliosis. IMPRESSION: 1. No acute findings. 2. Lung hyperinflation and chronic coarsened markings without pulmonary edema. 3. Bilateral healed rib fractures and scoliosis. Electronically signed by: Greig Pique MD 08/30/2024 05:12 PM EST RP Workstation: [id number]   Disposition Plan & Communication  Patient status: Observation  Admitted From: Home Planned disposition location: Home Anticipated discharge date: 1/7 pending clinical course  Family Communication: none at bedside    Author: Marien LITTIE Piety, DO [facility] 08/31/2024, 8:16 AM   Available by Epic secure chat 7AM-7PM. If 7PM-7AM, please contact night-coverage.  [facility] contact  information found on [email or web address].  "

## 2024-08-31 NOTE — Progress Notes (Signed)
 PHARMACIST - PHYSICIAN COMMUNICATION  Dr:   Lenon  CONCERNING: Antibiotic IV to Oral Route Change Policy  RECOMMENDATION: This patient is receiving azithromycin  by the intravenous route.  Based on criteria approved by the Pharmacy and Therapeutics Committee, the antibiotic(s) is/are being converted to the equivalent oral dose form(s).   DESCRIPTION: These criteria include: Patient being treated for a respiratory tract infection, urinary tract infection, cellulitis or clostridium difficile associated diarrhea if on metronidazole The patient is not neutropenic and does not exhibit a GI malabsorption state The patient is eating (either orally or via tube) and/or has been taking other orally administered medications for a least 24 hours The patient is improving clinically and has a Tmax < 100.5  If you have questions about this conversion, please contact the Pharmacy Department

## 2024-09-01 ENCOUNTER — Inpatient Hospital Stay

## 2024-09-01 DIAGNOSIS — J9601 Acute respiratory failure with hypoxia: Secondary | ICD-10-CM | POA: Diagnosis not present

## 2024-09-01 LAB — CBC WITH DIFFERENTIAL/PLATELET
Abs Immature Granulocytes: 0.37 K/uL — ABNORMAL HIGH (ref 0.00–0.07)
Basophils Absolute: 0.1 K/uL (ref 0.0–0.1)
Basophils Relative: 0 %
Eosinophils Absolute: 0 K/uL (ref 0.0–0.5)
Eosinophils Relative: 0 %
HCT: 42.8 % (ref 36.0–46.0)
Hemoglobin: 14.4 g/dL (ref 12.0–15.0)
Immature Granulocytes: 1 %
Lymphocytes Relative: 2 %
Lymphs Abs: 0.5 K/uL — ABNORMAL LOW (ref 0.7–4.0)
MCH: 30.4 pg (ref 26.0–34.0)
MCHC: 33.6 g/dL (ref 30.0–36.0)
MCV: 90.5 fL (ref 80.0–100.0)
Monocytes Absolute: 0.7 K/uL (ref 0.1–1.0)
Monocytes Relative: 2 %
Neutro Abs: 27.2 K/uL — ABNORMAL HIGH (ref 1.7–7.7)
Neutrophils Relative %: 95 %
Platelets: 263 K/uL (ref 150–400)
RBC: 4.73 MIL/uL (ref 3.87–5.11)
RDW: 13.3 % (ref 11.5–15.5)
Smear Review: NORMAL
WBC: 28.8 K/uL — ABNORMAL HIGH (ref 4.0–10.5)
nRBC: 0 % (ref 0.0–0.2)

## 2024-09-01 NOTE — Progress Notes (Signed)
 " PROGRESS NOTE    Dawn Sherman  FMW:[id number] DOB: 07/09/1956 DOA: 08/30/2024 PCP: Glendia Shad, MD  Subjective: No acute events overnight. Seen and examined at bedside. Reports feeling slightly better with improvement in shortness of breath. Denies any nausea, vomiting, constipation.   Hospital Course:  69 y.o. female with a PMH significant for COPD Gold stage III, persistent A-fib status post Watchman procedure, on Eliquis , HTN, HFrEF, CKD stage IV, anxiety/depression, recently hospitalized (12/27 to 08/25/24) with influenza A / COPD / acute respiratory failure, discharged on 4 L. Prior to this she only wore oxygen at night.    In the ED she was tachycardic to 104 and tachypneic to the 20s.  Initially with sats in the mid 90s on 4 L but due to the increased work of breathing with minimal exertion we will simply speaking, she was transitioned to high flow nasal cannula at 40 L/min   Labs notable for leukocytosis of 20,000, probably from recent steroid use.  Troponin 25, mild hyperkalemia 5.5.EKG showed sinus tachycardia at 104 and chest x-ray was nonacute.  Chronic coarse markings without pulmonary edema and hyperinflation   Patient treated with DuoNebs and steroids   Patient was admitted to medicine service for further workup and management of COPD exacerbation and possible bacterial PNA post-viral as outlined in detail below.   Assessment and Plan: COPD with acute exacerbation (HCC) Acute respiratory failure with hypoxia Recent influenza A 08/21/2024 Recently discharged with a new O2 requirement of 4 L, as per patient, she was only using 3-4L at night time after discharge Currently on 8L [place] < HFNC Schedule and as needed nebulized bronchodilators Cont steroids. Plan for 5 day course Antitussives, flutter valve Wean O2 as tolerated  Progressive CKD (chronic kidney disease), stage IV (HCC) Possible AKI on CKD Cr 4.87 < 3.7, GFR now less than 15, baseline Cr 2.8-3.2 Etiology  unclear UA unremarkable Renal US  with no acute findings, medical renal disease Monitor I/Os, renal function Consider nephrology consult if remains oliguric and Cr continues to worsen  Chronic HFrEF (heart failure with reduced ejection fraction) (HCC) Clinically euvolemic Continue GDMT  Leukocytosis Leukocytosis of 20,000, suspect related to recent steroids in the treatment of COPD exacerbation Continue to monitor  Persistent atrial fibrillation s/p Watchman procedure (HCC) As per patient, the Watchman procedure did not work and so is still on anticoagulation Continue Eliquis , carvedilol  and amiodarone   Essential hypertension Hold home torsemide  given concern for AKI  Hypothyroidism Continue home meds  DVT prophylaxis: apixaban  (ELIQUIS ) tablet 2.5 mg Start: 08/30/24 2230 apixaban  (ELIQUIS ) tablet 2.5 mg  Eliquis    Code Status: Full Code  Disposition Plan: TBD pending clinical course Reason for continuing need for hospitalization: Wean oxygen, monitor renal function  Objective: Vitals:   09/01/24 0900 09/01/24 0943 09/01/24 1141 09/01/24 1400  BP: (!) 132/58 (!) 141/75 (!) 133/59 132/61  Pulse: 73 78 76 79  Resp: 19  (!) 22 18  Temp:   98.1 F (36.7 C)   TempSrc:   Oral   SpO2: 100%  100% 100%  Weight:      Height:       No intake or output data in the 24 hours ending 09/01/24 1525 Filed Weights   08/31/24 0959  Weight: 52.2 kg    Examination:  Physical Exam Vitals and nursing note reviewed.  Constitutional:      General: She is not in acute distress.    Appearance: She is ill-appearing.     Comments: Weak, frail,  cachectic  HENT:     Head: Normocephalic and atraumatic.  Cardiovascular:     Rate and Rhythm: Normal rate and regular rhythm.     Pulses: Normal pulses.     Heart sounds: Normal heart sounds.  Pulmonary:     Effort: No respiratory distress.     Breath sounds: Wheezing present.  Abdominal:     General: Bowel sounds are normal. There is no  distension.     Palpations: Abdomen is soft.     Tenderness: There is no abdominal tenderness.  Neurological:     Mental Status: She is alert. Mental status is at baseline.     Data Reviewed: I have personally reviewed following labs and imaging studies  CBC: Recent Labs  Lab 08/30/24 1557 08/31/24 0800  WBC 20.7* 23.2*  HGB 15.8* 15.4*  HCT 50.1* 48.6*  MCV 94.7 95.1  PLT 311 246   Basic Metabolic Panel: Recent Labs  Lab 08/30/24 1557 08/31/24 0800  NA 137 136  K 5.5* 5.3*  CL 100 96*  CO2 21* 21*  GLUCOSE 100* 139*  BUN 69* 88*  CREATININE 3.70* 4.48*  CALCIUM  10.5* 10.2   GFR: Estimated Creatinine Clearance: 9.8 mL/min (A) (by C-G formula based on SCr of 4.48 mg/dL (H)). Liver Function Tests: No results for input(s): AST, ALT, ALKPHOS, BILITOT, PROT, ALBUMIN  in the last 168 hours. No results for input(s): LIPASE, AMYLASE in the last 168 hours. No results for input(s): AMMONIA in the last 168 hours. Coagulation Profile: No results for input(s): INR, PROTIME in the last 168 hours. Cardiac Enzymes: No results for input(s): CKTOTAL, CKMB, CKMBINDEX, TROPONINI in the last 168 hours. ProBNP, BNP (last 5 results) Recent Labs    02/23/24 1720  BNP 335.4*   HbA1C: No results for input(s): HGBA1C in the last 72 hours. CBG: No results for input(s): GLUCAP in the last 168 hours. Lipid Profile: No results for input(s): CHOL, HDL, LDLCALC, TRIG, CHOLHDL, LDLDIRECT in the last 72 hours. Thyroid  Function Tests: No results for input(s): TSH, T4TOTAL, FREET4, T3FREE, THYROIDAB in the last 72 hours. Anemia Panel: No results for input(s): VITAMINB12, FOLATE, FERRITIN, TIBC, IRON, RETICCTPCT in the last 72 hours. Sepsis Labs: No results for input(s): PROCALCITON, LATICACIDVEN in the last 168 hours.  No results found for this or any previous visit (from the past 240 hours).   Radiology Studies: US   RENAL Result Date: 09/01/2024 CLINICAL DATA:  Acute renal insufficiency. EXAM: RENAL / URINARY TRACT ULTRASOUND COMPLETE COMPARISON:  11/13/2022 FINDINGS: Right Kidney: Renal measurements: 5.2 x 1.8 x 2.2 cm = volume: 11 mL. Increased cortical echogenicity. 7 mm cyst over the lower pole. No hydronephrosis. Left Kidney: Renal measurements: 9.5 x 4.2 x 4.5 cm = volume: 93 mL. Mild increased cortical echogenicity. No hydronephrosis. 1.5 cm cyst over the upper pole and 1.2 cm cyst over the lower pole. Bladder: Appears normal for degree of bladder distention. Other: None. IMPRESSION: 1. No acute findings. 2. Atrophic right kidney unchanged with echogenic kidneys compatible with chronic medical renal disease. Small bilateral renal cysts. Electronically Signed   By: Toribio Agreste M.D.   On: 09/01/2024 13:00   DG Chest 2 View Result Date: 08/30/2024 EXAM: 2 VIEW(S) XRAY OF THE CHEST 08/30/2024 04:19:00 PM COMPARISON: 08/21/2024 CLINICAL HISTORY: sob FINDINGS: LUNGS AND PLEURA: Lung hyperinflation. Chronic coarsened markings without pulmonary edema. No pleural effusion. No pneumothorax. HEART AND MEDIASTINUM: No acute abnormality of the cardiac and mediastinal silhouettes. BONES AND SOFT TISSUES: Bilateral healed rib fractures. Scoliosis.  IMPRESSION: 1. No acute findings. 2. Lung hyperinflation and chronic coarsened markings without pulmonary edema. 3. Bilateral healed rib fractures and scoliosis. Electronically signed by: Greig Pique MD 08/30/2024 05:12 PM EST RP Workstation: [id number]    Scheduled Meds:  amiodarone   200 mg Oral Daily   apixaban   2.5 mg Oral BID   azithromycin   500 mg Oral Daily   buPROPion   150 mg Oral Daily   carvedilol   12.5 mg Oral BID WC   guaiFENesin   600 mg Oral BID   ipratropium-albuterol   3 mL Nebulization Q6H   levothyroxine   75 mcg Oral Q0600   predniSONE   40 mg Oral Q breakfast   torsemide   60 mg Oral Daily   venlafaxine  XR  150 mg Oral Daily   Continuous Infusions:   LOS:  1 day   Norval Bar, MD  [facility]  09/01/2024, 3:25 PM   "

## 2024-09-01 NOTE — ED Notes (Signed)
 Pt currently eating dinner.

## 2024-09-01 NOTE — ED Notes (Signed)
Lab work sent

## 2024-09-02 ENCOUNTER — Encounter: Payer: Self-pay | Admitting: Internal Medicine

## 2024-09-02 ENCOUNTER — Encounter: Payer: Self-pay | Admitting: Cardiovascular Disease

## 2024-09-02 DIAGNOSIS — J9601 Acute respiratory failure with hypoxia: Secondary | ICD-10-CM | POA: Diagnosis not present

## 2024-09-02 LAB — COMPREHENSIVE METABOLIC PANEL WITH GFR
ALT: 16 U/L (ref 0–44)
AST: 19 U/L (ref 15–41)
Albumin: 3.5 g/dL (ref 3.5–5.0)
Alkaline Phosphatase: 83 U/L (ref 38–126)
Anion gap: 19 — ABNORMAL HIGH (ref 5–15)
BUN: 116 mg/dL — ABNORMAL HIGH (ref 8–23)
CO2: 17 mmol/L — ABNORMAL LOW (ref 22–32)
Calcium: 8.8 mg/dL — ABNORMAL LOW (ref 8.9–10.3)
Chloride: 92 mmol/L — ABNORMAL LOW (ref 98–111)
Creatinine, Ser: 4.75 mg/dL — ABNORMAL HIGH (ref 0.44–1.00)
GFR, Estimated: 9 mL/min — ABNORMAL LOW
Glucose, Bld: 109 mg/dL — ABNORMAL HIGH (ref 70–99)
Potassium: 4.9 mmol/L (ref 3.5–5.1)
Sodium: 128 mmol/L — ABNORMAL LOW (ref 135–145)
Total Bilirubin: <0.2 mg/dL (ref 0.0–1.2)
Total Protein: 6.8 g/dL (ref 6.5–8.1)

## 2024-09-02 LAB — URINALYSIS, ROUTINE W REFLEX MICROSCOPIC
Bilirubin Urine: NEGATIVE
Glucose, UA: >=500 mg/dL — AB
Hgb urine dipstick: NEGATIVE
Ketones, ur: NEGATIVE mg/dL
Nitrite: NEGATIVE
Protein, ur: 100 mg/dL — AB
Specific Gravity, Urine: 1.01 (ref 1.005–1.030)
pH: 5 (ref 5.0–8.0)

## 2024-09-02 LAB — OSMOLALITY: Osmolality: 319 mosm/kg — ABNORMAL HIGH (ref 275–295)

## 2024-09-02 MED ORDER — SODIUM CHLORIDE 0.9 % IV SOLN: INTRAVENOUS | Status: DC

## 2024-09-02 MED ORDER — ALBUTEROL SULFATE HFA 108 (90 BASE) MCG/ACT IN AERS
2.0000 | INHALATION_SPRAY | Freq: Four times a day (QID) | RESPIRATORY_TRACT | Status: DC | PRN
  Filled 2024-09-02 (×3): qty 6.7

## 2024-09-02 NOTE — Progress Notes (Signed)
 " [facility]  ROUNDING NOTE   Subjective:   Dawn Sherman is a 69 year old female with past medical conditions including persistent atrial fibs, hypertension, COPD, anxiety and depression, and chronic kidney disease stage IV.  Patient presents to the emergency department with complaints of shortness of breath and has been admitted for Hyperkalemia [E87.5] COPD exacerbation (HCC) [J44.1] COPD with acute exacerbation (HCC) [J44.1]  Patient is known to our practice and is followed by Dr Jimmie. States she has been experiencing shortness of breath and cough since right after Christmas. She was diagnosed with Flu and discharged on oxygen. She states the symptoms did not improve. She returned for further evaluation.  Labs on ED arrival significant for potassium 5.5, BUN 69, creatinine 3.70 with GFR 13, troponin 25, white count 20.7 with hemoglobin 15.8.  Influenza positive during previous ED visit.  Chest x-ray negative for acute findings.  Renal ultrasound unremarkable.  Atrophic right kidney known finding.  We have been consulted to evaluate acute kidney injury.   Objective:  Vital signs in last 24 hours:  Temp:  [97.5 F (36.4 C)-98.5 F (36.9 C)] (P) 97.5 F (36.4 C) (01/08 1400) Pulse Rate:  [70-81] (P) 81 (01/08 1400) Resp:  [17-24] (P) 17 (01/08 1400) BP: (130-150)/(50-76) (P) 141/64 (01/08 1400) SpO2:  [91 %-100 %] (P) 94 % (01/08 1400) FiO2 (%):  [99 %] 99 % (01/08 0208) Weight:  [50.2 kg] 50.2 kg (01/07 2120)  Weight change: -1.964 kg Filed Weights   08/31/24 0959 09/01/24 2120  Weight: 52.2 kg 50.2 kg    Intake/Output: No intake/output data recorded.   Intake/Output this shift:  Total I/O In: 480 [P.O.:480] Out: 500 [Urine:500]  Physical Exam: General: NAD  Head: Normocephalic, atraumatic. Moist oral mucosal membranes  Eyes: Anicteric  Lungs:  Wheeze, [place] O2  Heart: Regular rate and rhythm  Abdomen:  Soft, nontender  Extremities: No peripheral edema.   Neurologic: Awake, alert, conversant  Skin: Warm,dry, no rash       Basic Metabolic Panel: Recent Labs  Lab 08/30/24 1557 08/31/24 0800 09/01/24 2015  NA 137 136 128*  K 5.5* 5.3* 4.9  CL 100 96* 92*  CO2 21* 21* 17*  GLUCOSE 100* 139* 109*  BUN 69* 88* 116*  CREATININE 3.70* 4.48* 4.75*  CALCIUM  10.5* 10.2 8.8*    Liver Function Tests: Recent Labs  Lab 09/01/24 2015  AST 19  ALT 16  ALKPHOS 83  BILITOT <0.2  PROT 6.8  ALBUMIN  3.5   No results for input(s): LIPASE, AMYLASE in the last 168 hours. No results for input(s): AMMONIA in the last 168 hours.  CBC: Recent Labs  Lab 08/30/24 1557 08/31/24 0800 09/01/24 2015  WBC 20.7* 23.2* 28.8*  NEUTROABS  --   --  27.2*  HGB 15.8* 15.4* 14.4  HCT 50.1* 48.6* 42.8  MCV 94.7 95.1 90.5  PLT 311 246 263    Cardiac Enzymes: No results for input(s): CKTOTAL, CKMB, CKMBINDEX, TROPONINI in the last 168 hours.  BNP: Invalid input(s): POCBNP  CBG: No results for input(s): GLUCAP in the last 168 hours.  Microbiology: Results for orders placed or performed during the hospital encounter of 08/21/24  Resp panel by RT-PCR (RSV, Flu A&B, Covid) Anterior Nasal Swab     Status: Abnormal   Collection Time: 08/21/24  1:23 PM   Specimen: Anterior Nasal Swab  Result Value Ref Range Status   SARS Coronavirus 2 by RT PCR NEGATIVE NEGATIVE Final    Comment: (NOTE)  SARS-CoV-2 target nucleic acids are NOT DETECTED.  The SARS-CoV-2 RNA is generally detectable in upper respiratory specimens during the acute phase of infection. The lowest concentration of SARS-CoV-2 viral copies this assay can detect is 138 copies/mL. A negative result does not preclude SARS-Cov-2 infection and should not be used as the sole basis for treatment or other patient management decisions. A negative result may occur with  improper specimen collection/handling, submission of specimen other than nasopharyngeal swab, presence of viral  mutation(s) within the areas targeted by this assay, and inadequate number of viral copies(<138 copies/mL). A negative result must be combined with clinical observations, patient history, and epidemiological information. The expected result is Negative.  Fact Sheet for Patients:  bloggercourse.com  Fact Sheet for Healthcare Providers:  seriousbroker.it  This test is no t yet approved or cleared by the [place]  FDA and  has been authorized for detection and/or diagnosis of SARS-CoV-2 by FDA under an Emergency Use Authorization (EUA). This EUA will remain  in effect (meaning this test can be used) for the duration of the COVID-19 declaration under Section 564(b)(1) of the Act, 21 U.S.C.section 360bbb-3(b)(1), unless the authorization is terminated  or revoked sooner.       Influenza A by PCR POSITIVE (A) NEGATIVE Final   Influenza B by PCR NEGATIVE NEGATIVE Final    Comment: (NOTE) The Xpert Xpress SARS-CoV-2/FLU/RSV plus assay is intended as an aid in the diagnosis of influenza from Nasopharyngeal swab specimens and should not be used as a sole basis for treatment. Nasal washings and aspirates are unacceptable for Xpert Xpress SARS-CoV-2/FLU/RSV testing.  Fact Sheet for Patients: bloggercourse.com  Fact Sheet for Healthcare Providers: seriousbroker.it  This test is not yet approved or cleared by the [place]  FDA and has been authorized for detection and/or diagnosis of SARS-CoV-2 by FDA under an Emergency Use Authorization (EUA). This EUA will remain in effect (meaning this test can be used) for the duration of the COVID-19 declaration under Section 564(b)(1) of the Act, 21 U.S.C. section 360bbb-3(b)(1), unless the authorization is terminated or revoked.     Resp Syncytial Virus by PCR NEGATIVE NEGATIVE Final    Comment: (NOTE) Fact Sheet for  Patients: bloggercourse.com  Fact Sheet for Healthcare Providers: seriousbroker.it  This test is not yet approved or cleared by the [place]  FDA and has been authorized for detection and/or diagnosis of SARS-CoV-2 by FDA under an Emergency Use Authorization (EUA). This EUA will remain in effect (meaning this test can be used) for the duration of the COVID-19 declaration under Section 564(b)(1) of the Act, 21 U.S.C. section 360bbb-3(b)(1), unless the authorization is terminated or revoked.  Performed at [facility], [address] [place], [place] [address]    *Note: Due to a large number of results and/or encounters for the requested time period, some results have not been displayed. A complete set of results can be found in Results Review.    Coagulation Studies: No results for input(s): LABPROT, INR in the last 72 hours.  Urinalysis: No results for input(s): COLORURINE, LABSPEC, PHURINE, GLUCOSEU, HGBUR, BILIRUBINUR, KETONESUR, PROTEINUR, UROBILINOGEN, NITRITE, LEUKOCYTESUR in the last 72 hours.  Invalid input(s): APPERANCEUR    Imaging: US  RENAL Result Date: 09/01/2024 CLINICAL DATA:  Acute renal insufficiency. EXAM: RENAL / URINARY TRACT ULTRASOUND COMPLETE COMPARISON:  11/13/2022 FINDINGS: Right Kidney: Renal measurements: 5.2 x 1.8 x 2.2 cm = volume: 11 mL. Increased cortical echogenicity. 7 mm cyst over the lower pole. No hydronephrosis. Left Kidney: Renal measurements: 9.5  x 4.2 x 4.5 cm = volume: 93 mL. Mild increased cortical echogenicity. No hydronephrosis. 1.5 cm cyst over the upper pole and 1.2 cm cyst over the lower pole. Bladder: Appears normal for degree of bladder distention. Other: None. IMPRESSION: 1. No acute findings. 2. Atrophic right kidney unchanged with echogenic kidneys compatible with chronic medical renal disease. Small bilateral renal cysts. Electronically Signed    By: Toribio Agreste M.D.   On: 09/01/2024 13:00     Medications:    sodium chloride  50 mL/hr at 09/02/24 1216    amiodarone   200 mg Oral Daily   apixaban   2.5 mg Oral BID   azithromycin   500 mg Oral Daily   buPROPion   150 mg Oral Daily   carvedilol   12.5 mg Oral BID WC   guaiFENesin   600 mg Oral BID   levothyroxine   75 mcg Oral Q0600   predniSONE   40 mg Oral Q breakfast   venlafaxine  XR  150 mg Oral Daily   acetaminophen  **OR** acetaminophen , albuterol , HYDROcodone -acetaminophen , ondansetron  **OR** ondansetron  (ZOFRAN ) IV  Assessment/ Plan:  [name] is a 69 y.o.  female with past medical conditions including persistent atrial fibs, hypertension, COPD, anxiety and depression, and chronic kidney disease stage IV.  Patient presents to the emergency department with complaints of shortness of breath and has been admitted for Hyperkalemia [E87.5] COPD exacerbation (HCC) [J44.1] COPD with acute exacerbation (HCC) [J44.1]   Acute kidney injury on chronic kidney disease stage IV.  Baseline creatinine 2.84 with GFR 17 on 08/11/2024.  Acute kidney injury likely secondary to infectious process.  Patient states she has maintained oral intake.  Renal ultrasound negative for obstruction, atrophic right kidney unknown finding.No acute indication for dialysis.  Denies uremic symptoms at this time.  Continue supportive measures.  We have discussed with the patient the possibility of transitioning to renal replacement therapy if renal function continues to deteriorate.  Lab Results  Component Value Date   CREATININE 4.75 (H) 09/01/2024   CREATININE 4.48 (H) 08/31/2024   CREATININE 3.70 (H) 08/30/2024    Intake/Output Summary (Last 24 hours) at 09/02/2024 1456 Last data filed at 09/02/2024 1406 Gross per 24 hour  Intake 480 ml  Output 500 ml  Net -20 ml   2.  COPD with acute exacerbation, recent diagnosis of influenza A on 08/21/2024.  New oxygen requirement of 4 L.  Continue supportive  measures managed by primary team with IV steroids and nebulizers.  3. Anemia of chronic kidney disease Lab Results  Component Value Date   HGB 14.4 09/01/2024    Hemoglobin within optimal range.  Will continue to monitor for now.  4. Secondary Hyperparathyroidism: with outpatient labs: PTH 124, phosphorus 5.1, calcium  9.1 on 07/12/24.   Lab Results  Component Value Date   PTH 92 (H) 11/14/2022   CALCIUM  8.8 (L) 09/01/2024   CAION 1.27 11/25/2022   CAION 1.29 11/25/2022   PHOS 4.1 11/14/2022    Will continue to monitor bone minerals during dialysis.    LOS: 2 Tyrae Alcoser 1/8/20262:56 PM   "

## 2024-09-02 NOTE — Plan of Care (Signed)
  Problem: Education: Goal: Knowledge of General Education information will improve Description: Including pain rating scale, medication(s)/side effects and non-pharmacologic comfort measures Outcome: Progressing   Problem: Clinical Measurements: Goal: Respiratory complications will improve Outcome: Progressing   Problem: Activity: Goal: Risk for activity intolerance will decrease Outcome: Progressing   Problem: Nutrition: Goal: Adequate nutrition will be maintained Outcome: Progressing   Problem: Elimination: Goal: Will not experience complications related to urinary retention Outcome: Progressing   Problem: Pain Managment: Goal: General experience of comfort will improve and/or be controlled Outcome: Progressing   Problem: Safety: Goal: Ability to remain free from injury will improve Outcome: Progressing   Problem: Skin Integrity: Goal: Risk for impaired skin integrity will decrease Outcome: Progressing

## 2024-09-02 NOTE — Progress Notes (Signed)
 Upon entering pt's room she was on 5L/[place] with a Sp02 of 100%.  Pt states she does not wear 02 at home nor does she take any respiratory medications.  I noticed an albuterol  inhaler at bedside.  I educated pt on how to properly use an MDI with a spacer and gave her 2 puffs. Pt was taken off of oxygen and her Sp02 was 89% on RA so pt was placed back on 2L/[place] bubble.Pt has alb nebs Q2PRN if needed and an albuterol  MDI was ordered as well

## 2024-09-02 NOTE — TOC CM/SW Note (Signed)
 Transition of Care [facility]) - Inpatient Brief Assessment   Patient Details  Name: ROSAMAE ROCQUE MRN: [id number] Date of Birth: [birth year]  [facility] of [facility]) CM/SW Contact:    Shasta DELENA Daring, RN Phone Number: 09/02/2024, 11:49 AM   Clinical Narrative: Patient lives in single family home. Husband drives her to appointments. PCP is [facility]. Patient has home O2 with [employer].. Has a walker at home, no additional DME. Uses [facility] [place] for pharmacy. No problems affording medications at this time. Husband will provide transportation home at discharge.  No additional TOC needs. Please place consult if needs arise.  Transition of Care Asessment: Insurance and Status: Insurance coverage has been reviewed Patient has primary care physician: Yes Home environment has been reviewed: Single family home Prior level of function:: independent Prior/Current Home Services: Current home services (Oxygen) Social Drivers of Health Review: SDOH reviewed no interventions necessary Readmission risk has been reviewed: Yes Transition of care needs: no transition of care needs at this time

## 2024-09-02 NOTE — Progress Notes (Signed)
 " PROGRESS NOTE    Dawn Sherman  FMW:[id number] DOB: Jul 11, 1956 DOA: 08/30/2024 PCP: Glendia Shad, MD  Subjective: No acute events overnight. Seen and examined at bedside. Reports feeling slightly better with improvement in shortness of breath. Reports having a lot of urine output when she went this morning. Denies any nausea, vomiting, constipation.    Hospital Course:  69 y.o. female with a PMH significant for COPD Gold stage III, persistent A-fib status post Watchman procedure, on Eliquis , HTN, HFrEF, CKD stage IV, anxiety/depression, recently hospitalized (12/27 to 08/25/24) with influenza A / COPD / acute respiratory failure, discharged on 4 L. Prior to this she only wore oxygen at night.    In the ED she was tachycardic to 104 and tachypneic to the 20s.  Initially with sats in the mid 90s on 4 L but due to the increased work of breathing with minimal exertion we will simply speaking, she was transitioned to high flow nasal cannula at 40 L/min   Labs notable for leukocytosis of 20,000, probably from recent steroid use.  Troponin 25, mild hyperkalemia 5.5.EKG showed sinus tachycardia at 104 and chest x-ray was nonacute.  Chronic coarse markings without pulmonary edema and hyperinflation   Patient treated with DuoNebs and steroids   Patient was admitted to medicine service for further workup and management of COPD exacerbation and possible bacterial PNA post-viral as outlined in detail below.   Assessment and Plan:  COPD with acute exacerbation (HCC) Acute respiratory failure with hypoxia Recent influenza A 08/21/2024 Recently discharged with a new O2 requirement of 4 L, as per patient, she was only using 3-4L at night time after discharge Currently on 5 < 8L [place], weaned off HFNC Schedule and as needed nebulized bronchodilators Cont steroids. Plan for 5 day course Antitussives, flutter valve Incentive spirometry OOB to chair during daytime Encourage ambulation Wean O2 as  tolerated   Progressive CKD (chronic kidney disease), stage IV (HCC) Possible AKI on CKD Cr 4.75 < 4.87, GFR now less than 15, baseline Cr 2.8-3.2 Etiology unclear UA unremarkable Renal US  with no acute findings, medical renal disease Monitor strict I/Os, renal function Nephrology consulted, spoke with service, awaiting recs    Chronic HFrEF (heart failure with reduced ejection fraction) (HCC) Clinically euvolemic Continue GDMT   Leukocytosis Suspect related to recent steroids in the treatment of COPD exacerbation Continue to monitor   Persistent atrial fibrillation s/p Watchman procedure (HCC) As per patient, the Watchman procedure did not work and so is still on anticoagulation Continue Eliquis , carvedilol  and amiodarone    Essential hypertension Hold home torsemide  given concern for AKI   Hypothyroidism Continue home meds  DVT prophylaxis: apixaban  (ELIQUIS ) tablet 2.5 mg Start: 08/30/24 2230 apixaban  (ELIQUIS ) tablet 2.5 mg  Eliquis    Code Status: Full Code  Disposition Plan: TBD Reason for continuing need for hospitalization: severity of illness, wean oxygen, nephrology consult pending  Objective: Vitals:   09/01/24 2310 09/01/24 2315 09/02/24 0317 09/02/24 0700  BP: (!) 142/61  136/66 (!) 150/76  Pulse: 74  70 72  Resp: 20  20 19   Temp: (!) 97.5 F (36.4 C)  97.7 F (36.5 C) 97.6 F (36.4 C)  TempSrc:    Oral  SpO2: 91% 93% 100% 100%  Weight:      Height:        Intake/Output Summary (Last 24 hours) at 09/02/2024 1319 Last data filed at 09/02/2024 1018 Gross per 24 hour  Intake 240 ml  Output 500 ml  Net -260  ml   Filed Weights   08/31/24 0959 09/01/24 2120  Weight: 52.2 kg 50.2 kg    Examination:  Physical Exam Vitals and nursing note reviewed.  Constitutional:      General: She is not in acute distress.    Appearance: She is ill-appearing.     Comments: Frail, weak  HENT:     Head: Normocephalic and atraumatic.  Cardiovascular:     Rate  and Rhythm: Normal rate and regular rhythm.     Pulses: Normal pulses.     Heart sounds: Normal heart sounds.  Pulmonary:     Effort: Pulmonary effort is normal.     Breath sounds: Normal breath sounds.  Abdominal:     General: Bowel sounds are normal. There is no distension.     Palpations: Abdomen is soft.     Tenderness: There is no abdominal tenderness.  Neurological:     Mental Status: She is alert.     Data Reviewed: I have personally reviewed following labs and imaging studies  CBC: Recent Labs  Lab 08/30/24 1557 08/31/24 0800 09/01/24 2015  WBC 20.7* 23.2* 28.8*  NEUTROABS  --   --  27.2*  HGB 15.8* 15.4* 14.4  HCT 50.1* 48.6* 42.8  MCV 94.7 95.1 90.5  PLT 311 246 263   Basic Metabolic Panel: Recent Labs  Lab 08/30/24 1557 08/31/24 0800 09/01/24 2015  NA 137 136 128*  K 5.5* 5.3* 4.9  CL 100 96* 92*  CO2 21* 21* 17*  GLUCOSE 100* 139* 109*  BUN 69* 88* 116*  CREATININE 3.70* 4.48* 4.75*  CALCIUM  10.5* 10.2 8.8*   GFR: Estimated Creatinine Clearance: 8.9 mL/min (A) (by C-G formula based on SCr of 4.75 mg/dL (H)). Liver Function Tests: Recent Labs  Lab 09/01/24 2015  AST 19  ALT 16  ALKPHOS 83  BILITOT <0.2  PROT 6.8  ALBUMIN  3.5   No results for input(s): LIPASE, AMYLASE in the last 168 hours. No results for input(s): AMMONIA in the last 168 hours. Coagulation Profile: No results for input(s): INR, PROTIME in the last 168 hours. Cardiac Enzymes: No results for input(s): CKTOTAL, CKMB, CKMBINDEX, TROPONINI in the last 168 hours. ProBNP, BNP (last 5 results) Recent Labs    02/23/24 1720  BNP 335.4*   HbA1C: No results for input(s): HGBA1C in the last 72 hours. CBG: No results for input(s): GLUCAP in the last 168 hours. Lipid Profile: No results for input(s): CHOL, HDL, LDLCALC, TRIG, CHOLHDL, LDLDIRECT in the last 72 hours. Thyroid  Function Tests: No results for input(s): TSH, T4TOTAL, FREET4,  T3FREE, THYROIDAB in the last 72 hours. Anemia Panel: No results for input(s): VITAMINB12, FOLATE, FERRITIN, TIBC, IRON, RETICCTPCT in the last 72 hours. Sepsis Labs: No results for input(s): PROCALCITON, LATICACIDVEN in the last 168 hours.  No results found for this or any previous visit (from the past 240 hours).   Radiology Studies: US  RENAL Result Date: 09/01/2024 CLINICAL DATA:  Acute renal insufficiency. EXAM: RENAL / URINARY TRACT ULTRASOUND COMPLETE COMPARISON:  11/13/2022 FINDINGS: Right Kidney: Renal measurements: 5.2 x 1.8 x 2.2 cm = volume: 11 mL. Increased cortical echogenicity. 7 mm cyst over the lower pole. No hydronephrosis. Left Kidney: Renal measurements: 9.5 x 4.2 x 4.5 cm = volume: 93 mL. Mild increased cortical echogenicity. No hydronephrosis. 1.5 cm cyst over the upper pole and 1.2 cm cyst over the lower pole. Bladder: Appears normal for degree of bladder distention. Other: None. IMPRESSION: 1. No acute findings. 2. Atrophic right kidney  unchanged with echogenic kidneys compatible with chronic medical renal disease. Small bilateral renal cysts. Electronically Signed   By: Toribio Agreste M.D.   On: 09/01/2024 13:00    Scheduled Meds:  amiodarone   200 mg Oral Daily   apixaban   2.5 mg Oral BID   azithromycin   500 mg Oral Daily   buPROPion   150 mg Oral Daily   carvedilol   12.5 mg Oral BID WC   guaiFENesin   600 mg Oral BID   levothyroxine   75 mcg Oral Q0600   predniSONE   40 mg Oral Q breakfast   venlafaxine  XR  150 mg Oral Daily   Continuous Infusions:  sodium chloride  50 mL/hr at 09/02/24 1216     LOS: 2 days   Norval Bar, MD  [facility]  09/02/2024, 1:19 PM   "

## 2024-09-03 DIAGNOSIS — J9601 Acute respiratory failure with hypoxia: Secondary | ICD-10-CM | POA: Diagnosis not present

## 2024-09-03 LAB — BASIC METABOLIC PANEL WITH GFR
Anion gap: 13 (ref 5–15)
BUN: 111 mg/dL — ABNORMAL HIGH (ref 8–23)
CO2: 19 mmol/L — ABNORMAL LOW (ref 22–32)
Calcium: 8.7 mg/dL — ABNORMAL LOW (ref 8.9–10.3)
Chloride: 103 mmol/L (ref 98–111)
Creatinine, Ser: 3.88 mg/dL — ABNORMAL HIGH (ref 0.44–1.00)
GFR, Estimated: 12 mL/min — ABNORMAL LOW
Glucose, Bld: 88 mg/dL (ref 70–99)
Potassium: 5.2 mmol/L — ABNORMAL HIGH (ref 3.5–5.1)
Sodium: 135 mmol/L (ref 135–145)

## 2024-09-03 LAB — CBC
HCT: 44.1 % (ref 36.0–46.0)
Hemoglobin: 14.5 g/dL (ref 12.0–15.0)
MCH: 30.4 pg (ref 26.0–34.0)
MCHC: 32.9 g/dL (ref 30.0–36.0)
MCV: 92.5 fL (ref 80.0–100.0)
Platelets: 230 K/uL (ref 150–400)
RBC: 4.77 MIL/uL (ref 3.87–5.11)
RDW: 13 % (ref 11.5–15.5)
WBC: 18.5 K/uL — ABNORMAL HIGH (ref 4.0–10.5)
nRBC: 0 % (ref 0.0–0.2)

## 2024-09-03 MED ORDER — CALCITRIOL 0.25 MCG PO CAPS
0.2500 ug | ORAL_CAPSULE | Freq: Every day | ORAL | Status: DC
  Administered 2024-09-03 – 2024-09-04 (×2): 0.25 ug via ORAL
  Filled 2024-09-03 (×3): qty 1

## 2024-09-03 MED ORDER — ATORVASTATIN CALCIUM 20 MG PO TABS
40.0000 mg | ORAL_TABLET | Freq: Every day | ORAL | Status: DC
  Administered 2024-09-03 – 2024-09-04 (×2): 40 mg via ORAL
  Filled 2024-09-03 (×2): qty 2

## 2024-09-03 MED ORDER — PANTOPRAZOLE SODIUM 40 MG PO TBEC
40.0000 mg | DELAYED_RELEASE_TABLET | Freq: Every day | ORAL | Status: DC
  Administered 2024-09-03 – 2024-09-04 (×2): 40 mg via ORAL
  Filled 2024-09-03 (×2): qty 1

## 2024-09-03 MED ORDER — SODIUM ZIRCONIUM CYCLOSILICATE 5 G PO PACK
5.0000 g | PACK | Freq: Once | ORAL | Status: AC
  Administered 2024-09-03: 5 g via ORAL
  Filled 2024-09-03: qty 1

## 2024-09-03 MED ORDER — CALCIUM CARBONATE 1250 (500 CA) MG PO TABS
1.0000 | ORAL_TABLET | Freq: Three times a day (TID) | ORAL | Status: DC
  Administered 2024-09-03 – 2024-09-04 (×4): 1250 mg via ORAL
  Filled 2024-09-03 (×5): qty 1

## 2024-09-03 MED ORDER — HYDRALAZINE HCL 25 MG PO TABS
25.0000 mg | ORAL_TABLET | Freq: Three times a day (TID) | ORAL | Status: DC
  Administered 2024-09-03 – 2024-09-04 (×4): 25 mg via ORAL
  Filled 2024-09-03 (×4): qty 1

## 2024-09-03 MED ORDER — AMLODIPINE BESYLATE 10 MG PO TABS
10.0000 mg | ORAL_TABLET | Freq: Every day | ORAL | Status: DC
  Administered 2024-09-03 – 2024-09-04 (×2): 10 mg via ORAL
  Filled 2024-09-03 (×2): qty 1

## 2024-09-03 MED ORDER — LORATADINE 10 MG PO TABS
10.0000 mg | ORAL_TABLET | Freq: Every day | ORAL | Status: DC
  Administered 2024-09-03 – 2024-09-04 (×2): 10 mg via ORAL
  Filled 2024-09-03 (×2): qty 1

## 2024-09-03 MED ORDER — ISOSORBIDE MONONITRATE ER 30 MG PO TB24
30.0000 mg | ORAL_TABLET | Freq: Every day | ORAL | Status: DC
  Administered 2024-09-03 – 2024-09-04 (×2): 30 mg via ORAL
  Filled 2024-09-03 (×2): qty 1

## 2024-09-03 NOTE — Progress Notes (Signed)
 " [facility]  ROUNDING NOTE   Subjective:   Dawn Sherman is a 69 year old female with past medical conditions including persistent atrial fibs, hypertension, COPD, anxiety and depression, and chronic kidney disease stage IV.  Patient presents to the emergency department with complaints of shortness of breath and has been admitted for Hyperkalemia [E87.5] COPD exacerbation (HCC) [J44.1] COPD with acute exacerbation (HCC) [J44.1]  Patient is known to our practice and is followed by Dr Jimmie.   Patient sitting up in bed Alert Remains on 2L [place] Denies shortness of breath  Creatinine 3.88  Objective:  Vital signs in last 24 hours:  Temp:  [97.4 F (36.3 C)-98.4 F (36.9 C)] 98.4 F (36.9 C) (01/09 1235) Pulse Rate:  [75-84] 84 (01/09 1235) Resp:  [17-22] 19 (01/09 1235) BP: (128-156)/(64-79) 128/69 (01/09 1235) SpO2:  [93 %-100 %] 93 % (01/09 1235)  Weight change:  Filed Weights   08/31/24 0959 09/01/24 2120  Weight: 52.2 kg 50.2 kg    Intake/Output: I/O last 3 completed shifts: In: 720 [P.O.:720] Out: 1000 [Urine:1000]   Intake/Output this shift:  Total I/O In: -  Out: 600 [Urine:600]  Physical Exam: General: NAD  Head: Normocephalic, atraumatic. Moist oral mucosal membranes  Eyes: Anicteric  Lungs:  [place], [place] O2  Heart: Regular rate and rhythm  Abdomen:  Soft, nontender  Extremities: No peripheral edema.  Neurologic: Awake, alert, conversant  Skin: Warm,dry, no rash       Basic Metabolic Panel: Recent Labs  Lab 08/30/24 1557 08/31/24 0800 09/01/24 2015 09/03/24 0429  NA 137 136 128* 135  K 5.5* 5.3* 4.9 5.2*  CL 100 96* 92* 103  CO2 21* 21* 17* 19*  GLUCOSE 100* 139* 109* 88  BUN 69* 88* 116* 111*  CREATININE 3.70* 4.48* 4.75* 3.88*  CALCIUM  10.5* 10.2 8.8* 8.7*    Liver Function Tests: Recent Labs  Lab 09/01/24 2015  AST 19  ALT 16  ALKPHOS 83  BILITOT <0.2  PROT 6.8  ALBUMIN  3.5   No results for input(s): LIPASE,  AMYLASE in the last 168 hours. No results for input(s): AMMONIA in the last 168 hours.  CBC: Recent Labs  Lab 08/30/24 1557 08/31/24 0800 09/01/24 2015 09/03/24 0429  WBC 20.7* 23.2* 28.8* 18.5*  NEUTROABS  --   --  27.2*  --   HGB 15.8* 15.4* 14.4 14.5  HCT 50.1* 48.6* 42.8 44.1  MCV 94.7 95.1 90.5 92.5  PLT 311 246 263 230    Cardiac Enzymes: No results for input(s): CKTOTAL, CKMB, CKMBINDEX, TROPONINI in the last 168 hours.  BNP: Invalid input(s): POCBNP  CBG: No results for input(s): GLUCAP in the last 168 hours.  Microbiology: Results for orders placed or performed during the hospital encounter of 08/21/24  Resp panel by RT-PCR (RSV, Flu A&B, Covid) Anterior Nasal Swab     Status: Abnormal   Collection Time: 08/21/24  1:23 PM   Specimen: Anterior Nasal Swab  Result Value Ref Range Status   SARS Coronavirus 2 by RT PCR NEGATIVE NEGATIVE Final    Comment: (NOTE) SARS-CoV-2 target nucleic acids are NOT DETECTED.  The SARS-CoV-2 RNA is generally detectable in upper respiratory specimens during the acute phase of infection. The lowest concentration of SARS-CoV-2 viral copies this assay can detect is 138 copies/mL. A negative result does not preclude SARS-Cov-2 infection and should not be used as the sole basis for treatment or other patient management decisions. A negative result may occur with  improper specimen  collection/handling, submission of specimen other than nasopharyngeal swab, presence of viral mutation(s) within the areas targeted by this assay, and inadequate number of viral copies(<138 copies/mL). A negative result must be combined with clinical observations, patient history, and epidemiological information. The expected result is Negative.  Fact Sheet for Patients:  bloggercourse.com  Fact Sheet for Healthcare Providers:  seriousbroker.it  This test is no t yet approved or cleared  by the [place]  FDA and  has been authorized for detection and/or diagnosis of SARS-CoV-2 by FDA under an Emergency Use Authorization (EUA). This EUA will remain  in effect (meaning this test can be used) for the duration of the COVID-19 declaration under Section 564(b)(1) of the Act, 21 U.S.C.section 360bbb-3(b)(1), unless the authorization is terminated  or revoked sooner.       Influenza A by PCR POSITIVE (A) NEGATIVE Final   Influenza B by PCR NEGATIVE NEGATIVE Final    Comment: (NOTE) The Xpert Xpress SARS-CoV-2/FLU/RSV plus assay is intended as an aid in the diagnosis of influenza from Nasopharyngeal swab specimens and should not be used as a sole basis for treatment. Nasal washings and aspirates are unacceptable for Xpert Xpress SARS-CoV-2/FLU/RSV testing.  Fact Sheet for Patients: bloggercourse.com  Fact Sheet for Healthcare Providers: seriousbroker.it  This test is not yet approved or cleared by the [place]  FDA and has been authorized for detection and/or diagnosis of SARS-CoV-2 by FDA under an Emergency Use Authorization (EUA). This EUA will remain in effect (meaning this test can be used) for the duration of the COVID-19 declaration under Section 564(b)(1) of the Act, 21 U.S.C. section 360bbb-3(b)(1), unless the authorization is terminated or revoked.     Resp Syncytial Virus by PCR NEGATIVE NEGATIVE Final    Comment: (NOTE) Fact Sheet for Patients: bloggercourse.com  Fact Sheet for Healthcare Providers: seriousbroker.it  This test is not yet approved or cleared by the [place]  FDA and has been authorized for detection and/or diagnosis of SARS-CoV-2 by FDA under an Emergency Use Authorization (EUA). This EUA will remain in effect (meaning this test can be used) for the duration of the COVID-19 declaration under Section 564(b)(1) of the Act, 21  U.S.C. section 360bbb-3(b)(1), unless the authorization is terminated or revoked.  Performed at [facility], [address] [place], [place] [address]    *Note: Due to a large number of results and/or encounters for the requested time period, some results have not been displayed. A complete set of results can be found in Results Review.    Coagulation Studies: No results for input(s): LABPROT, INR in the last 72 hours.  Urinalysis: Recent Labs    09/01/24 1845  COLORURINE YELLOW*  LABSPEC 1.010  PHURINE 5.0  GLUCOSEU >=500*  HGBUR NEGATIVE  BILIRUBINUR NEGATIVE  KETONESUR NEGATIVE  PROTEINUR 100*  NITRITE NEGATIVE  LEUKOCYTESUR TRACE*      Imaging: No results found.    Medications:    sodium chloride  50 mL/hr at 09/02/24 1216    amiodarone   200 mg Oral Daily   amLODipine   10 mg Oral Daily   apixaban   2.5 mg Oral BID   atorvastatin   40 mg Oral Daily   azithromycin   500 mg Oral Daily   buPROPion   150 mg Oral Daily   calcitRIOL   0.25 mcg Oral Daily   calcium  carbonate  1 tablet Oral TID WC   carvedilol   12.5 mg Oral BID WC   guaiFENesin   600 mg Oral BID   hydrALAZINE   25 mg Oral TID  isosorbide  mononitrate  30 mg Oral Daily   levothyroxine   75 mcg Oral Q0600   loratadine   10 mg Oral Daily   pantoprazole   40 mg Oral Daily   predniSONE   40 mg Oral Q breakfast   venlafaxine  XR  150 mg Oral Daily   acetaminophen  **OR** acetaminophen , albuterol , HYDROcodone -acetaminophen , ondansetron  **OR** ondansetron  (ZOFRAN ) IV  Assessment/ Plan:  [name] is a 69 y.o.  female with past medical conditions including persistent atrial fibs, hypertension, COPD, anxiety and depression, and chronic kidney disease stage IV.  Patient presents to the emergency department with complaints of shortness of breath and has been admitted for Hyperkalemia [E87.5] COPD exacerbation (HCC) [J44.1] COPD with acute exacerbation (HCC) [J44.1]   Acute kidney injury on  chronic kidney disease stage IV.  Baseline creatinine 2.84 with GFR 17 on 08/11/2024.  Acute kidney injury likely secondary to infectious process.  Renal ultrasound negative for obstruction, atrophic right kidney unknown finding.  Creatinine has responded appropriately to IV hydration. Will continue for an additional day. No acute indication for dialysis.   Lab Results  Component Value Date   CREATININE 3.88 (H) 09/03/2024   CREATININE 4.75 (H) 09/01/2024   CREATININE 4.48 (H) 08/31/2024    Intake/Output Summary (Last 24 hours) at 09/03/2024 1253 Last data filed at 09/03/2024 0913 Gross per 24 hour  Intake 480 ml  Output 1100 ml  Net -620 ml   2.  COPD with acute exacerbation, recent diagnosis of influenza A on 08/21/2024.  New oxygen requirement of 4 L.  Has been weaned to 2L. Continue supportive care  3. Anemia of chronic kidney disease Lab Results  Component Value Date   HGB 14.5 09/03/2024    Hemoglobin stable.  Will continue to monitor for now.  4. Secondary Hyperparathyroidism: with outpatient labs: PTH 124, phosphorus 5.1, calcium  9.1 on 07/12/24.   Lab Results  Component Value Date   PTH 92 (H) 11/14/2022   CALCIUM  8.7 (L) 09/03/2024   CAION 1.27 11/25/2022   CAION 1.29 11/25/2022   PHOS 4.1 11/14/2022    Calcium  and phosphorus stable   LOS: 3 Kyri Shader 1/9/202612:53 PM   "

## 2024-09-03 NOTE — Care Management Important Message (Signed)
 Important Message  Patient Details  Name: Dawn Sherman MRN: [id number] Date of Birth: Mar 20, 1956   Important Message Given:  Yes - Medicare IM     [name] 09/03/2024, 3:20 PM

## 2024-09-03 NOTE — Progress Notes (Signed)
 " PROGRESS NOTE    Dawn Sherman  FMW:[id number] DOB: 09/22/1955 DOA: 08/30/2024 PCP: Glendia Shad, MD  Subjective: No acute events overnight. Seen and examined at bedside. Reports feeling better. Tolerating oral intake without n/v. Denies constipation.   Hospital Course:  69 y.o. female with a PMH significant for COPD Gold stage III, persistent A-fib status post Watchman procedure, on Eliquis , HTN, HFrEF, CKD stage IV, anxiety/depression, recently hospitalized (12/27 to 08/25/24) with influenza A / COPD / acute respiratory failure, discharged on 4 L. Prior to this she only wore oxygen at night.    In the ED she was tachycardic to 104 and tachypneic to the 20s.  Initially with sats in the mid 90s on 4 L but due to the increased work of breathing with minimal exertion we will simply speaking, she was transitioned to high flow nasal cannula at 40 L/min   Labs notable for leukocytosis of 20,000, probably from recent steroid use.  Troponin 25, mild hyperkalemia 5.5.EKG showed sinus tachycardia at 104 and chest x-ray was nonacute.  Chronic coarse markings without pulmonary edema and hyperinflation   Patient treated with DuoNebs and steroids   Patient was admitted to medicine service for further workup and management of COPD exacerbation and possible bacterial PNA post-viral as outlined in detail below.   Assessment and Plan:  AKI on CKD 4 Cr 3.88 < 4.87, baseline Cr 2.8-3.2 Etiology unclear, likely in the setting of acute illness with COPD exacerbation UA unremarkable Renal US  with no acute findings, medical renal disease Cont gentle IV fluids as per nephro recs Monitor strict I/Os, renal function Nephrology following    Chronic HFrEF (heart failure with reduced ejection fraction) (HCC) Clinically euvolemic Continue GDMT  COPD with acute exacerbation (HCC) Acute respiratory failure with hypoxia Recent influenza A 08/21/2024 Recently discharged with a new O2 requirement of 4 L, as  per patient, she was only using 3-4L at night time after discharge Currently on 5 < 8L [place], weaned off HFNC Schedule and as needed nebulized bronchodilators Cont steroids. Plan for 5 day course Antitussives, flutter valve Incentive spirometry OOB to chair during daytime Encourage ambulation Wean O2 as tolerated    Leukocytosis Suspect related to recent steroids in the treatment of COPD exacerbation Continue to monitor   Persistent atrial fibrillation s/p Watchman procedure (HCC) As per patient, the Watchman procedure did not work and so is still on anticoagulation Continue Eliquis , carvedilol  and amiodarone    Essential hypertension Hold home torsemide  given concern for AKI   Hypothyroidism Continue home meds  DVT prophylaxis: apixaban  (ELIQUIS ) tablet 2.5 mg Start: 08/30/24 2230 apixaban  (ELIQUIS ) tablet 2.5 mg  Eliquis    Code Status: Full Code  Disposition Plan: home Reason for continuing need for hospitalization: IV fluids, nephrology recommendations  Objective: Vitals:   09/02/24 2306 09/03/24 0331 09/03/24 0911 09/03/24 1235  BP: (!) 156/79 (!) 153/79 138/65 128/69  Pulse: 83 78 83 84  Resp: 20 20 19 19   Temp: (!) 97.5 F (36.4 C) (!) 97.4 F (36.3 C) 97.6 F (36.4 C) 98.4 F (36.9 C)  TempSrc:      SpO2: 100% 96% 98% 93%  Weight:      Height:        Intake/Output Summary (Last 24 hours) at 09/03/2024 1422 Last data filed at 09/03/2024 0913 Gross per 24 hour  Intake 240 ml  Output 1100 ml  Net -860 ml   Filed Weights   08/31/24 0959 09/01/24 2120  Weight: 52.2 kg 50.2 kg  Examination:  Physical Exam Vitals and nursing note reviewed.  Constitutional:      General: She is not in acute distress.    Appearance: She is ill-appearing.  Cardiovascular:     Rate and Rhythm: Normal rate and regular rhythm.     Pulses: Normal pulses.     Heart sounds: Normal heart sounds.  Pulmonary:     Effort: Pulmonary effort is normal.     Breath sounds: Normal  breath sounds.  Abdominal:     General: Bowel sounds are normal.     Palpations: Abdomen is soft.  Neurological:     Mental Status: She is alert.     Data Reviewed: I have personally reviewed following labs and imaging studies  CBC: Recent Labs  Lab 08/30/24 1557 08/31/24 0800 09/01/24 2015 09/03/24 0429  WBC 20.7* 23.2* 28.8* 18.5*  NEUTROABS  --   --  27.2*  --   HGB 15.8* 15.4* 14.4 14.5  HCT 50.1* 48.6* 42.8 44.1  MCV 94.7 95.1 90.5 92.5  PLT 311 246 263 230   Basic Metabolic Panel: Recent Labs  Lab 08/30/24 1557 08/31/24 0800 09/01/24 2015 09/03/24 0429  NA 137 136 128* 135  K 5.5* 5.3* 4.9 5.2*  CL 100 96* 92* 103  CO2 21* 21* 17* 19*  GLUCOSE 100* 139* 109* 88  BUN 69* 88* 116* 111*  CREATININE 3.70* 4.48* 4.75* 3.88*  CALCIUM  10.5* 10.2 8.8* 8.7*   GFR: Estimated Creatinine Clearance: 10.8 mL/min (A) (by C-G formula based on SCr of 3.88 mg/dL (H)). Liver Function Tests: Recent Labs  Lab 09/01/24 2015  AST 19  ALT 16  ALKPHOS 83  BILITOT <0.2  PROT 6.8  ALBUMIN  3.5   No results for input(s): LIPASE, AMYLASE in the last 168 hours. No results for input(s): AMMONIA in the last 168 hours. Coagulation Profile: No results for input(s): INR, PROTIME in the last 168 hours. Cardiac Enzymes: No results for input(s): CKTOTAL, CKMB, CKMBINDEX, TROPONINI in the last 168 hours. ProBNP, BNP (last 5 results) Recent Labs    02/23/24 1720  BNP 335.4*   HbA1C: No results for input(s): HGBA1C in the last 72 hours. CBG: No results for input(s): GLUCAP in the last 168 hours. Lipid Profile: No results for input(s): CHOL, HDL, LDLCALC, TRIG, CHOLHDL, LDLDIRECT in the last 72 hours. Thyroid  Function Tests: No results for input(s): TSH, T4TOTAL, FREET4, T3FREE, THYROIDAB in the last 72 hours. Anemia Panel: No results for input(s): VITAMINB12, FOLATE, FERRITIN, TIBC, IRON, RETICCTPCT in the last 72  hours. Sepsis Labs: No results for input(s): PROCALCITON, LATICACIDVEN in the last 168 hours.  No results found for this or any previous visit (from the past 240 hours).   Radiology Studies: No results found.  Scheduled Meds:  amiodarone   200 mg Oral Daily   amLODipine   10 mg Oral Daily   apixaban   2.5 mg Oral BID   atorvastatin   40 mg Oral Daily   azithromycin   500 mg Oral Daily   buPROPion   150 mg Oral Daily   calcitRIOL   0.25 mcg Oral Daily   calcium  carbonate  1 tablet Oral TID WC   carvedilol   12.5 mg Oral BID WC   guaiFENesin   600 mg Oral BID   hydrALAZINE   25 mg Oral TID   isosorbide  mononitrate  30 mg Oral Daily   levothyroxine   75 mcg Oral Q0600   loratadine   10 mg Oral Daily   pantoprazole   40 mg Oral Daily   predniSONE   40 mg Oral Q breakfast   venlafaxine  XR  150 mg Oral Daily   Continuous Infusions:  sodium chloride  50 mL/hr at 09/02/24 1216     LOS: 3 days   Norval Bar, MD  [facility]  09/03/2024, 2:22 PM   "

## 2024-09-03 NOTE — Plan of Care (Signed)

## 2024-09-04 ENCOUNTER — Other Ambulatory Visit: Payer: Self-pay

## 2024-09-04 DIAGNOSIS — N184 Chronic kidney disease, stage 4 (severe): Secondary | ICD-10-CM

## 2024-09-04 DIAGNOSIS — J9601 Acute respiratory failure with hypoxia: Secondary | ICD-10-CM | POA: Diagnosis not present

## 2024-09-04 LAB — BASIC METABOLIC PANEL WITH GFR
Anion gap: 10 (ref 5–15)
BUN: 101 mg/dL — ABNORMAL HIGH (ref 8–23)
CO2: 19 mmol/L — ABNORMAL LOW (ref 22–32)
Calcium: 9.8 mg/dL (ref 8.9–10.3)
Chloride: 110 mmol/L (ref 98–111)
Creatinine, Ser: 3.1 mg/dL — ABNORMAL HIGH (ref 0.44–1.00)
GFR, Estimated: 16 mL/min — ABNORMAL LOW
Glucose, Bld: 99 mg/dL (ref 70–99)
Potassium: 4.8 mmol/L (ref 3.5–5.1)
Sodium: 138 mmol/L (ref 135–145)

## 2024-09-04 NOTE — Progress Notes (Signed)
 " [facility]  ROUNDING NOTE   Subjective:   Dawn Sherman is a 69 year old female with past medical conditions including persistent atrial fibs, hypertension, COPD, anxiety and depression, and chronic kidney disease stage IV.  Patient presents to the emergency department with complaints of shortness of breath and has been admitted for Hyperkalemia [E87.5] COPD exacerbation (HCC) [J44.1] COPD with acute exacerbation (HCC) [J44.1]  Patient is known to our practice and is followed by Dr Jimmie.   Patient about to ambulate with PT. Denies pain or discomfort. Feeling better. Encouraged PO intake.   Objective:  Vital signs in last 24 hours:  Temp:  [97.4 F (36.3 C)-97.9 F (36.6 C)] 97.6 F (36.4 C) (01/10 1230) Pulse Rate:  [80-91] 84 (01/10 1230) Resp:  [16-20] 18 (01/10 1230) BP: (127-172)/(60-88) 127/62 (01/10 1230) SpO2:  [96 %-98 %] 98 % (01/10 1230)  Weight change:  Filed Weights   08/31/24 0959 09/01/24 2120  Weight: 52.2 kg 50.2 kg    Intake/Output: I/O last 3 completed shifts: In: 240 [P.O.:240] Out: 2000 [Urine:2000]   Intake/Output this shift:  Total I/O In: 240 [P.O.:240] Out: -   Physical Exam: General: NAD  Head: Normocephalic  Eyes: Anicteric  Lungs:  Intermittent wheeze, [place] O2  Heart: Regular   Abdomen:  Soft, nontender  Extremities: No peripheral edema.  Neurologic: Awake, alert, conversant  Skin: Warm,dry, no rash       Basic Metabolic Panel: Recent Labs  Lab 08/30/24 1557 08/31/24 0800 09/01/24 2015 09/03/24 0429 09/04/24 0511  NA 137 136 128* 135 138  K 5.5* 5.3* 4.9 5.2* 4.8  CL 100 96* 92* 103 110  CO2 21* 21* 17* 19* 19*  GLUCOSE 100* 139* 109* 88 99  BUN 69* 88* 116* 111* 101*  CREATININE 3.70* 4.48* 4.75* 3.88* 3.10*  CALCIUM  10.5* 10.2 8.8* 8.7* 9.8    Liver Function Tests: Recent Labs  Lab 09/01/24 2015  AST 19  ALT 16  ALKPHOS 83  BILITOT <0.2  PROT 6.8  ALBUMIN  3.5   No results for input(s):  LIPASE, AMYLASE in the last 168 hours. No results for input(s): AMMONIA in the last 168 hours.  CBC: Recent Labs  Lab 08/30/24 1557 08/31/24 0800 09/01/24 2015 09/03/24 0429  WBC 20.7* 23.2* 28.8* 18.5*  NEUTROABS  --   --  27.2*  --   HGB 15.8* 15.4* 14.4 14.5  HCT 50.1* 48.6* 42.8 44.1  MCV 94.7 95.1 90.5 92.5  PLT 311 246 263 230    Cardiac Enzymes: No results for input(s): CKTOTAL, CKMB, CKMBINDEX, TROPONINI in the last 168 hours.  BNP: Invalid input(s): POCBNP  CBG: No results for input(s): GLUCAP in the last 168 hours.  Microbiology: Results for orders placed or performed during the hospital encounter of 08/21/24  Resp panel by RT-PCR (RSV, Flu A&B, Covid) Anterior Nasal Swab     Status: Abnormal   Collection Time: 08/21/24  1:23 PM   Specimen: Anterior Nasal Swab  Result Value Ref Range Status   SARS Coronavirus 2 by RT PCR NEGATIVE NEGATIVE Final    Comment: (NOTE) SARS-CoV-2 target nucleic acids are NOT DETECTED.  The SARS-CoV-2 RNA is generally detectable in upper respiratory specimens during the acute phase of infection. The lowest concentration of SARS-CoV-2 viral copies this assay can detect is 138 copies/mL. A negative result does not preclude SARS-Cov-2 infection and should not be used as the sole basis for treatment or other patient management decisions. A negative result may occur with  improper specimen collection/handling, submission of specimen other than nasopharyngeal swab, presence of viral mutation(s) within the areas targeted by this assay, and inadequate number of viral copies(<138 copies/mL). A negative result must be combined with clinical observations, patient history, and epidemiological information. The expected result is Negative.  Fact Sheet for Patients:  bloggercourse.com  Fact Sheet for Healthcare Providers:  seriousbroker.it  This test is no t yet approved or  cleared by the [place]  FDA and  has been authorized for detection and/or diagnosis of SARS-CoV-2 by FDA under an Emergency Use Authorization (EUA). This EUA will remain  in effect (meaning this test can be used) for the duration of the COVID-19 declaration under Section 564(b)(1) of the Act, 21 U.S.C.section 360bbb-3(b)(1), unless the authorization is terminated  or revoked sooner.       Influenza A by PCR POSITIVE (A) NEGATIVE Final   Influenza B by PCR NEGATIVE NEGATIVE Final    Comment: (NOTE) The Xpert Xpress SARS-CoV-2/FLU/RSV plus assay is intended as an aid in the diagnosis of influenza from Nasopharyngeal swab specimens and should not be used as a sole basis for treatment. Nasal washings and aspirates are unacceptable for Xpert Xpress SARS-CoV-2/FLU/RSV testing.  Fact Sheet for Patients: bloggercourse.com  Fact Sheet for Healthcare Providers: seriousbroker.it  This test is not yet approved or cleared by the [place]  FDA and has been authorized for detection and/or diagnosis of SARS-CoV-2 by FDA under an Emergency Use Authorization (EUA). This EUA will remain in effect (meaning this test can be used) for the duration of the COVID-19 declaration under Section 564(b)(1) of the Act, 21 U.S.C. section 360bbb-3(b)(1), unless the authorization is terminated or revoked.     Resp Syncytial Virus by PCR NEGATIVE NEGATIVE Final    Comment: (NOTE) Fact Sheet for Patients: bloggercourse.com  Fact Sheet for Healthcare Providers: seriousbroker.it  This test is not yet approved or cleared by the [place]  FDA and has been authorized for detection and/or diagnosis of SARS-CoV-2 by FDA under an Emergency Use Authorization (EUA). This EUA will remain in effect (meaning this test can be used) for the duration of the COVID-19 declaration under Section 564(b)(1) of the Act,  21 U.S.C. section 360bbb-3(b)(1), unless the authorization is terminated or revoked.  Performed at [facility], [address] [place], [place] [address]    *Note: Due to a large number of results and/or encounters for the requested time period, some results have not been displayed. A complete set of results can be found in Results Review.    Coagulation Studies: No results for input(s): LABPROT, INR in the last 72 hours.  Urinalysis: Recent Labs    09/01/24 1845  COLORURINE YELLOW*  LABSPEC 1.010  PHURINE 5.0  GLUCOSEU >=500*  HGBUR NEGATIVE  BILIRUBINUR NEGATIVE  KETONESUR NEGATIVE  PROTEINUR 100*  NITRITE NEGATIVE  LEUKOCYTESUR TRACE*      Imaging: No results found.    Medications:      amiodarone   200 mg Oral Daily   amLODipine   10 mg Oral Daily   apixaban   2.5 mg Oral BID   atorvastatin   40 mg Oral Daily   buPROPion   150 mg Oral Daily   calcitRIOL   0.25 mcg Oral Daily   calcium  carbonate  1 tablet Oral TID WC   carvedilol   12.5 mg Oral BID WC   guaiFENesin   600 mg Oral BID   hydrALAZINE   25 mg Oral TID   isosorbide  mononitrate  30 mg Oral Daily   levothyroxine   75  mcg Oral Q0600   loratadine   10 mg Oral Daily   pantoprazole   40 mg Oral Daily   venlafaxine  XR  150 mg Oral Daily   acetaminophen  **OR** acetaminophen , albuterol , HYDROcodone -acetaminophen , ondansetron  **OR** ondansetron  (ZOFRAN ) IV  Assessment/ Plan:  Dawn Sherman is a 69 y.o.  female with past medical conditions including persistent atrial fibs, hypertension, COPD, anxiety and depression, and chronic kidney disease stage IV.  Patient presents to the emergency department with complaints of shortness of breath and has been admitted for Hyperkalemia [E87.5] COPD exacerbation (HCC) [J44.1] COPD with acute exacerbation (HCC) [J44.1]   Acute kidney injury on chronic kidney disease stage IV.  Baseline creatinine 2.84 with GFR 17 on 08/11/2024.  Acute kidney injury likely  secondary to infectious process.  Renal ultrasound negative for obstruction, atrophic right kidney unknown finding.  Creatinine has responded appropriately to IV hydration. Discontinued today. No acute indication for dialysis. Patient to follow-up with out office after discharge. Lab Results  Component Value Date   CREATININE 3.10 (H) 09/04/2024   CREATININE 3.88 (H) 09/03/2024   CREATININE 4.75 (H) 09/01/2024   Intake/Output Summary (Last 24 hours) at 09/04/2024 1546 Last data filed at 09/04/2024 1028 Gross per 24 hour  Intake 240 ml  Output 900 ml  Net -660 ml     2.  COPD with acute exacerbation, recent diagnosis of influenza A on 08/21/2024.  New oxygen requirement of 4 L.  Has been weaned to 2L. Primary to wean as tolerated. Continue supportive care  3. Anemia of chronic kidney disease Lab Results  Component Value Date   HGB 14.5 09/03/2024    Hemoglobin stable.    4. Secondary Hyperparathyroidism: with outpatient labs: PTH 124, phosphorus 5.1, calcium  9.1 on 07/12/24.   Lab Results  Component Value Date   PTH 92 (H) 11/14/2022   CALCIUM  9.8 09/04/2024   CAION 1.27 11/25/2022   CAION 1.29 11/25/2022   PHOS 4.1 11/14/2022    Calcium  and phosphorus stable   LOS: 4 Bodee Lafoe SHAUNNA Dines 1/10/20263:44 PM   "

## 2024-09-04 NOTE — Discharge Instructions (Signed)
 Wear 2L oxygen continuously all the time Get repeat blood work in one week Follow up with nephrologist outpatient in 2 weeks Follow up with PCP within 1 week

## 2024-09-04 NOTE — Plan of Care (Signed)

## 2024-09-04 NOTE — Discharge Summary (Signed)
 " [facility] Physician Discharge Summary   Patient name: Dawn Sherman  Admit date:     08/30/2024  Discharge date: 09/04/2024  Attending Physician: LENON MARIEN CROME [[id number]]  Discharge Physician: Norval Bar   PCP: Glendia Shad, MD  Admitted From: Home  Disposition:  Home  Recommendations for Outpatient Follow-up:  Follow up with PCP in 1-2 weeks Follow up repeat blood work in one week Follow up with nephrology clinic in 2 weeks  Home Health:No Equipment/Devices: @ECDMELIST @  Discharge Condition:Stable CODE STATUS:FULL Diet recommendation: Heart Healthy Fluid Restriction: None  Hospital Summary:   69 y.o. female with a PMH significant for COPD Gold stage III, persistent A-fib status post Watchman procedure, on Eliquis , HTN, HFrEF, CKD stage IV, anxiety/depression, recently hospitalized (12/27 to 08/25/24) with influenza A / COPD / acute respiratory failure, discharged on 4 L. Prior to this she only wore oxygen at night. In the ED she was tachycardic to 104 and tachypneic to the 20s.  Initially with sats in the mid 90s on 4 L but due to the increased work of breathing with minimal exertion we will simply speaking, she was transitioned to high flow nasal cannula at 40 L/min. Labs notable for leukocytosis of 20,000, probably from recent steroid use.  Troponin 25, mild hyperkalemia 5.5.EKG showed sinus tachycardia at 104 and chest x-ray was nonacute.  Chronic coarse markings without pulmonary edema and hyperinflation. Patient treated with DuoNebs and steroids.   Patient was admitted to medicine service for further workup and management of COPD exacerbation and possible bacterial PNA post-viral as outlined in detail below.  Hospital Course by Problem:  AKI on CKD 4 Cr 3.10 < 4.87, baseline Cr 2.8-3.2. Etiology unclear, likely in the setting of acute illness with COPD exacerbation. UA unremarkable. Renal US  with no acute findings, medical renal disease. Improved with  gentle IV hydration. Stopped home lasix  - monitor renal function labwork in one week - follow up with nephrology outpatient in 2 weeks   Chronic HFrEF (heart failure with reduced ejection fraction) (HCC) Clinically euvolemic. Stopped home lasix  given AKI. Continue remaining GDMT - get repeat blood work in one week - follow up with PCP and nephrology to discuss when appropriate to resume of diuretic therapy   COPD with acute exacerbation (HCC) Acute respiratory failure with hypoxia Recent influenza A 08/21/2024 Recently discharged with a new O2 requirement of 4 L, as per patient, she was only using 3-4L at night time after discharge. Weaned off HFNC and now doing well on 2L via [place]. Finished 5 days of steroid therapy. Finished 5 days of azithromycin  therapy.  - counseled on continuous 2L Oxygen use at home Cont steroids. Plan for 5 day course   Persistent atrial fibrillation s/p Watchman procedure (HCC) As per patient, the Watchman procedure did not work and so is still on anticoagulation Continue home Eliquis , carvedilol  and amiodarone    Essential hypertension Stopped home torsemide  given concern for AKI   Hypothyroidism Continue home meds  Discharge Diagnoses:  Principal Problem:   Acute hypoxemic respiratory failure (HCC) Active Problems:   COPD with acute exacerbation (HCC)   Leukocytosis   Chronic HFrEF (heart failure with reduced ejection fraction) (HCC)   Persistent atrial fibrillation s/p Watchman procedure (HCC)   Essential hypertension   CKD (chronic kidney disease), stage IV (HCC)   Hypothyroidism   Discharge Instructions  Discharge Instructions     Increase activity slowly   Complete by: As directed       Allergies as of  09/04/2024       Reactions   Sulfate Rash   Codeine Sulfate Nausea Only   Benicar [olmesartan]    Talked with patient February 10, 2020, intolerance is unclear, tried several medications around that time and one of them gave her a rash but  she is not clear which 1.   Amoxicillin Rash   Clindamycin Dermatitis   Clindamycin/lincomycin Rash   Entresto  [sacubitril -valsartan ] Other (See Comments)   hyperkalemia   Morphine And Codeine Rash   Penicillins Rash        Medication List     STOP taking these medications    torsemide  20 MG tablet Commonly known as: DEMADEX        TAKE these medications    acetaminophen  325 MG tablet Commonly known as: TYLENOL  Take 650 mg by mouth every 6 (six) hours as needed for mild pain (pain score 1-3) or moderate pain (pain score 4-6) (pain.).   albuterol  108 (90 Base) MCG/ACT inhaler Commonly known as: VENTOLIN  HFA Inhale 2 puffs into the lungs every 6 (six) hours as needed for wheezing or shortness of breath.   amiodarone  200 MG tablet Commonly known as: PACERONE  Take 200 mg by mouth daily.   amLODipine  10 MG tablet Commonly known as: NORVASC  Take 10 mg by mouth daily.   apixaban  2.5 MG Tabs tablet Commonly known as: Eliquis  Take 1 tablet (2.5 mg total) by mouth 2 (two) times daily.   atorvastatin  40 MG tablet Commonly known as: LIPITOR TAKE ONE TABLET BY MOUTH EVERY DAY   buPROPion  150 MG 24 hr tablet Commonly known as: WELLBUTRIN  XL Take 1 tablet (150 mg total) by mouth daily.   calcitRIOL  0.25 MCG capsule Commonly known as: ROCALTROL  Take 0.25 mcg by mouth daily.   calcium  carbonate 1250 (500 Ca) MG tablet Commonly known as: OS-CAL - dosed in mg of elemental calcium  Take 1 tablet (500 mg of elemental calcium  total) by mouth 3 (three) times daily with meals.   carvedilol  12.5 MG tablet Commonly known as: COREG  Take 12.5 mg by mouth 2 (two) times daily with a meal.   Farxiga  10 MG Tabs tablet Generic drug: dapagliflozin  propanediol Take 1 tablet (10 mg total) by mouth daily.   hydrALAZINE  25 MG tablet Commonly known as: APRESOLINE  Take 1 tablet (25 mg total) by mouth 3 (three) times daily.   isosorbide  mononitrate 30 MG 24 hr tablet Commonly known as:  IMDUR  Take 1 tablet (30 mg total) by mouth daily.   levothyroxine  75 MCG tablet Commonly known as: SYNTHROID  TAKE ONE TABLET BY MOUTH DAILY BEFORE BREAKFAST   loratadine  10 MG tablet Commonly known as: CLARITIN  Take 10 mg by mouth daily.   omeprazole  20 MG capsule Commonly known as: PRILOSEC Take 1 capsule (20 mg total) by mouth daily.   umeclidinium-vilanterol 62.5-25 MCG/ACT Aepb Commonly known as: Anoro Ellipta  Inhale 1 puff into the lungs daily.   venlafaxine  XR 150 MG 24 hr capsule Commonly known as: EFFEXOR -XR TAKE ONE CAPSULE BY MOUTH EVERY DAY        Allergies[1]  Discharge Exam: Vitals:   09/04/24 0847 09/04/24 1230  BP: (!) 172/88 127/62  Pulse: 91 84  Resp: 18 18  Temp: (!) 97.4 F (36.3 C) 97.6 F (36.4 C)  SpO2: 98% 98%    Physical Exam Vitals and nursing note reviewed.  Constitutional:      General: She is not in acute distress.    Appearance: She is ill-appearing (chronically).     Comments: frail  HENT:     Head: Normocephalic and atraumatic.  Cardiovascular:     Rate and Rhythm: Normal rate and regular rhythm.     Pulses: Normal pulses.     Heart sounds: Normal heart sounds.  Pulmonary:     Effort: Pulmonary effort is normal.     Breath sounds: Normal breath sounds.  Abdominal:     General: Bowel sounds are normal.     Palpations: Abdomen is soft.  Neurological:     Mental Status: She is alert.     The results of significant diagnostics from this hospitalization (including imaging, microbiology, ancillary and laboratory) are listed below for reference.    Microbiology: No results found for this or any previous visit (from the past 240 hours).   Labs: ProBNP, BNP (last 5 results) Recent Labs    02/23/24 1720  BNP 335.4*   Basic Metabolic Panel: Recent Labs  Lab 08/30/24 1557 08/31/24 0800 09/01/24 2015 09/03/24 0429 09/04/24 0511  NA 137 136 128* 135 138  K 5.5* 5.3* 4.9 5.2* 4.8  CL 100 96* 92* 103 110  CO2 21*  21* 17* 19* 19*  GLUCOSE 100* 139* 109* 88 99  BUN 69* 88* 116* 111* 101*  CREATININE 3.70* 4.48* 4.75* 3.88* 3.10*  CALCIUM  10.5* 10.2 8.8* 8.7* 9.8   Liver Function Tests: Recent Labs  Lab 09/01/24 2015  AST 19  ALT 16  ALKPHOS 83  BILITOT <0.2  PROT 6.8  ALBUMIN  3.5   No results for input(s): LIPASE, AMYLASE in the last 168 hours. No results for input(s): AMMONIA in the last 168 hours. CBC: Recent Labs  Lab 08/30/24 1557 08/31/24 0800 09/01/24 2015 09/03/24 0429  WBC 20.7* 23.2* 28.8* 18.5*  NEUTROABS  --   --  27.2*  --   HGB 15.8* 15.4* 14.4 14.5  HCT 50.1* 48.6* 42.8 44.1  MCV 94.7 95.1 90.5 92.5  PLT 311 246 263 230   Cardiac Enzymes: No results for input(s): CKTOTAL, CKMB, CKMBINDEX, TROPONINI, TROPONINIHS in the last 168 hours. BNP: No results for input(s): BNP in the last 168 hours. CBG: No results for input(s): GLUCAP in the last 168 hours. D-Dimer No results for input(s): DDIMER in the last 72 hours. Hgb A1c No results for input(s): HGBA1C in the last 72 hours. Lipid Profile No results for input(s): CHOL, HDL, LDLCALC, TRIG, CHOLHDL, LDLDIRECT in the last 72 hours. Thyroid  function studies No results for input(s): TSH, T4TOTAL, FREET4, T3FREE, THYROIDAB in the last 72 hours.  Invalid input(s): FREET3 Anemia work up No results for input(s): VITAMINB12, FOLATE, FERRITIN, TIBC, IRON, RETICCTPCT in the last 72 hours. Urinalysis    Component Value Date/Time   COLORURINE YELLOW (A) 09/01/2024 1845   APPEARANCEUR HAZY (A) 09/01/2024 1845   LABSPEC 1.010 09/01/2024 1845   PHURINE 5.0 09/01/2024 1845   GLUCOSEU >=500 (A) 09/01/2024 1845   GLUCOSEU NEGATIVE 01/22/2017 1052   HGBUR NEGATIVE 09/01/2024 1845   BILIRUBINUR NEGATIVE 09/01/2024 1845   KETONESUR NEGATIVE 09/01/2024 1845   PROTEINUR 100 (A) 09/01/2024 1845   UROBILINOGEN 0.2 01/22/2017 1052   NITRITE NEGATIVE 09/01/2024 1845    LEUKOCYTESUR TRACE (A) 09/01/2024 1845   Sepsis Labs Recent Labs  Lab 08/30/24 1557 08/31/24 0800 09/01/24 2015 09/03/24 0429  WBC 20.7* 23.2* 28.8* 18.5*    Procedures/Studies: US  RENAL Result Date: 09/01/2024 CLINICAL DATA:  Acute renal insufficiency. EXAM: RENAL / URINARY TRACT ULTRASOUND COMPLETE COMPARISON:  11/13/2022 FINDINGS: Right Kidney: Renal measurements: 5.2 x 1.8 x 2.2 cm =  volume: 11 mL. Increased cortical echogenicity. 7 mm cyst over the lower pole. No hydronephrosis. Left Kidney: Renal measurements: 9.5 x 4.2 x 4.5 cm = volume: 93 mL. Mild increased cortical echogenicity. No hydronephrosis. 1.5 cm cyst over the upper pole and 1.2 cm cyst over the lower pole. Bladder: Appears normal for degree of bladder distention. Other: None. IMPRESSION: 1. No acute findings. 2. Atrophic right kidney unchanged with echogenic kidneys compatible with chronic medical renal disease. Small bilateral renal cysts. Electronically Signed   By: Toribio Agreste M.D.   On: 09/01/2024 13:00   DG Chest 2 View Result Date: 08/30/2024 EXAM: 2 VIEW(S) XRAY OF THE CHEST 08/30/2024 04:19:00 PM COMPARISON: 08/21/2024 CLINICAL HISTORY: sob FINDINGS: LUNGS AND PLEURA: Lung hyperinflation. Chronic coarsened markings without pulmonary edema. No pleural effusion. No pneumothorax. HEART AND MEDIASTINUM: No acute abnormality of the cardiac and mediastinal silhouettes. BONES AND SOFT TISSUES: Bilateral healed rib fractures. Scoliosis. IMPRESSION: 1. No acute findings. 2. Lung hyperinflation and chronic coarsened markings without pulmonary edema. 3. Bilateral healed rib fractures and scoliosis. Electronically signed by: Greig Pique MD 08/30/2024 05:12 PM EST RP Workstation: [id number]   DG Chest 2 View Result Date: 08/21/2024 CLINICAL DATA:  Shortness of breath since last night weight coarsened this morning. EXAM: CHEST - 2 VIEW COMPARISON:  02/23/2024 FINDINGS: Cardiomediastinal silhouette and pulmonary vasculature are  within normal limits. Interval development of patchy opacity at the RIGHT lung base which is favored to be atelectasis. Lungs otherwise clear. Scoliotic curvature of the thoracolumbar spine again seen. Multiple BILATERAL healed rib fractures also again seen. IMPRESSION: Patchy opacities at the RIGHT lung base are favored to be atelectasis rather than pneumonia. Electronically Signed   By: Aliene Lloyd M.D.   On: 08/21/2024 14:41    Time coordinating discharge: 45 mins  SIGNED:  Norval Bar, MD [facility] 09/04/2024, 12:39 PM     [1]  Allergies Allergen Reactions   Sulfate Rash   Codeine Sulfate Nausea Only   Benicar [Olmesartan]     Talked with patient February 10, 2020, intolerance is unclear, tried several medications around that time and one of them gave her a rash but she is not clear which 1.   Amoxicillin Rash   Clindamycin Dermatitis   Clindamycin/Lincomycin Rash   Entresto  [Sacubitril -Valsartan ] Other (See Comments)    hyperkalemia   Morphine And Codeine Rash   Penicillins Rash   "

## 2024-09-06 ENCOUNTER — Telehealth: Payer: Self-pay

## 2024-09-06 NOTE — Patient Instructions (Signed)
 Visit Information  Thank you for taking time to visit with me today. Please don't hesitate to contact me if I can be of assistance to you.  Patient instructions: take medications as prescribed.  consider scheduling hospital follow up visit with her primary care provider. use  oxygen as prescribed.   notify provider for any new/ ongoing symptoms and call 911 for any severe symptoms.  take medications as prescribed    Patient verbalizes understanding of instructions and care plan provided today and agrees to view in MyChart. Active MyChart status and patient understanding of how to access instructions and care plan via MyChart confirmed with patient.     The patient has been provided with contact information for the care management team and has been advised to call with any health related questions or concerns.   Please call the care guide team at [phone] if you need to cancel or reschedule your appointment.   Please call the Suicide and Crisis Lifeline: 988 call the [place]  National Suicide Prevention Lifeline: 925-468-9816 or TTY: 475-518-1959 TTY (816)061-9622) to talk to a trained counselor if you are experiencing a Mental Health or Behavioral Health Crisis or need someone to talk to.  Arvin Seip RN, BSN, CCM [employer], Population Health Case Manager Phone: [phone]

## 2024-09-06 NOTE — Transitions of Care (Post Inpatient/ED Visit) (Signed)
 "  09/06/2024  Name: Dawn Sherman MRN: [id number] DOB: 08/17/1956  Today's TOC FU Call Status: Today's TOC FU Call Status:: Successful TOC FU Call Completed TOC FU Call Complete Date: 09/06/24  Patient's Name and Date of Birth confirmed. Name, DOB  Transition Care Management Follow-up Telephone Call Date of Discharge: 09/04/24 Discharge Facility: [facility]) Type of Discharge: Inpatient Admission Primary Inpatient Discharge Diagnosis:: AKI on CKD/ COPD exacerbation How have you been since you were released from the hospital?: Better Any questions or concerns?: No  Items Reviewed: Did you receive and understand the discharge instructions provided?: Yes Medications obtained,verified, and reconciled?: Yes (Medications Reviewed) Any new allergies since your discharge?: No Dietary orders reviewed?: Yes Type of Diet Ordered:: low salt heart healthy Do you have support at home?: Yes People in Home [RPT]: spouse Name of Support/Comfort Primary Source: Risa Auman  Medications Reviewed Today: Medications Reviewed Today     Reviewed by Zora Glendenning E, RN (Registered Nurse) on 09/06/24 at 1342  Med List Status: <None>   Medication Order Taking? Sig Documenting Provider Last Dose Status Informant  acetaminophen  (TYLENOL ) 325 MG tablet [id number] Yes Take 650 mg by mouth every 6 (six) hours as needed for mild pain (pain score 1-3) or moderate pain (pain score 4-6) (pain.). [provider]  Active Spouse/Significant Other           Med Note CHRISTIE ALEXANDER   Tue Apr 20, 2024  4:35 PM)    albuterol  (VENTOLIN  HFA) 108 [phone] Base) MCG/ACT inhaler [id number] Yes Inhale 2 puffs into the lungs every 6 (six) hours as needed for wheezing or shortness of breath. Glendia Shad, MD  Active Spouse/Significant Other  amiodarone  (PACERONE ) 200 MG tablet [id number] Yes Take 200 mg by mouth daily. [provider]  Active Spouse/Significant Other  amLODipine   (NORVASC ) 10 MG tablet [id number] Yes Take 10 mg by mouth daily. [provider]  Active Spouse/Significant Other  apixaban  (ELIQUIS ) 2.5 MG TABS tablet [id number] Yes Take 1 tablet (2.5 mg total) by mouth 2 (two) times daily. Wonda Sharper, MD  Active Spouse/Significant Other  atorvastatin  (LIPITOR) 40 MG tablet [id number] Yes TAKE ONE TABLET BY MOUTH EVERY DAY Glendia Shad, MD  Active Spouse/Significant Other  buPROPion  (WELLBUTRIN  XL) 150 MG 24 hr tablet [id number] Yes Take 1 tablet (150 mg total) by mouth daily. Glendia Shad, MD  Active Spouse/Significant Other  calcitRIOL  (ROCALTROL ) 0.25 MCG capsule [id number] Yes Take 0.25 mcg by mouth daily. [provider]  Active Spouse/Significant Other  calcium  carbonate (OS-CAL - DOSED IN MG OF ELEMENTAL CALCIUM ) 1250 (500 Ca) MG tablet [id number] Yes Take 1 tablet (500 mg of elemental calcium  total) by mouth 3 (three) times daily with meals. Patel, Sona, MD  Active Spouse/Significant Other  carvedilol  (COREG ) 12.5 MG tablet [id number] Yes Take 12.5 mg by mouth 2 (two) times daily with a meal. [provider]  Active Spouse/Significant Other  FARXIGA  10 MG TABS tablet [id number] Yes Take 1 tablet (10 mg total) by mouth daily. Glendia Shad, MD  Active Spouse/Significant Other           Med Note DEBRIA, SUEANNE BIRCH   Thu Jun 03, 2024 11:17 AM)    hydrALAZINE  (APRESOLINE ) 25 MG tablet [id number] Yes Take 1 tablet (25 mg total) by mouth 3 (three) times daily. Rolan Ezra RAMAN, MD  Active Spouse/Significant Other  isosorbide  mononitrate (IMDUR ) 30 MG 24 hr tablet [id number] Yes Take 1 tablet (30 mg total) by mouth daily. Rolan Ezra  S, MD  Active Self  levothyroxine  (SYNTHROID ) 75 MCG tablet [id number] Yes TAKE ONE TABLET BY MOUTH DAILY BEFORE OFILIA Glendia Shad, MD  Active Spouse/Significant Other  loratadine  (CLARITIN ) 10 MG tablet [id number] Yes Take 10 mg by mouth daily. [provider]  Active Spouse/Significant  Other  omeprazole  (PRILOSEC) 20 MG capsule [id number] Yes Take 1 capsule (20 mg total) by mouth daily. Riddle, Suzann, NP  Active Spouse/Significant Other  umeclidinium-vilanterol (ANORO ELLIPTA ) 62.5-25 MCG/ACT AEPB [id number]  Inhale 1 puff into the lungs daily.  Patient not taking: Reported on 09/06/2024   Isadora Hose, MD  Active Spouse/Significant Other           Med Note DEBRIA, SUEANNE BIRCH   Thu Jun 03, 2024 11:17 AM) Unable to afford.  venlafaxine  XR (EFFEXOR -XR) 150 MG 24 hr capsule [id number] Yes TAKE ONE CAPSULE BY MOUTH EVERY DAY Glendia Shad, MD  Active Spouse/Significant Other            Home Care and Equipment/Supplies: Were Home Health Services Ordered?: No Any new equipment or medical supplies ordered?: No  Functional Questionnaire: Do you need assistance with bathing/showering or dressing?: No Do you need assistance with meal preparation?: No Do you need assistance with eating?: No Do you have difficulty maintaining continence: No Do you need assistance with getting out of bed/getting out of a chair/moving?: No Do you have difficulty managing or taking your medications?: No  Follow up appointments reviewed: PCP Follow-up appointment confirmed?: No (Offered to call and schedule a hospital follow up visit for patient with her primary care provider. Patient declind stating she is schduled to see the nephrologist o 09/08/24. Discussed importance of seeing primary provider) Specialist Hospital Follow-up appointment confirmed?: Yes Date of Specialist follow-up appointment?: 09/08/24 Follow-Up Specialty Provider:: Dr. Dennise Do you need transportation to your follow-up appointment?: No Do you understand care options if your condition(s) worsen?: Yes-patient verbalized understanding  SDOH Interventions Today    Flowsheet Row Most Recent Value  SDOH Interventions   Food Insecurity Interventions Intervention Not Indicated  Housing Interventions Intervention Not Indicated   Transportation Interventions Intervention Not Indicated  Utilities Interventions Intervention Not Indicated   Discussed and offered 30 day TOC program.  Patient declined. The patient has been provided with contact information for the care management team and has been advised to call with any health -related questions or concerns.  The patient verbalized understanding with current plan of care.  The patient is directed to their insurance card regarding availability of benefits coverage.    Arvin Seip RN, BSN, CCM [employer], Population Health Case Manager Phone: [phone]  "

## 2024-09-14 ENCOUNTER — Ambulatory Visit: Admitting: Internal Medicine

## 2024-09-14 DIAGNOSIS — I10 Essential (primary) hypertension: Secondary | ICD-10-CM

## 2024-09-14 DIAGNOSIS — J189 Pneumonia, unspecified organism: Secondary | ICD-10-CM

## 2024-09-14 DIAGNOSIS — E7800 Pure hypercholesterolemia, unspecified: Secondary | ICD-10-CM

## 2024-09-14 NOTE — Progress Notes (Deleted)
 "  Subjective:    Patient ID: Dawn Sherman, female    DOB: 1955/12/13, 69 y.o.   MRN: [id number]  Patient here for No chief complaint on file.   HPI Here for a hospital follow up - admitted 08/30/24 - 09/04/24 - copd exacerbation and possible bacterial pneumonia - post viral. Recently hospitalized 08/21/24 - 08/25/24 - infulenza/copd/acute respiratory failure. Most recent admission - treated with nebs, steroids, azithromycine and oxygen. Discharged to continue 2L oxygen continuous at home. CR noted to be 3.10<4.87 with baseline 2.8-3.2. urinalysis unremarkable. Renal ultrasound - no acute findings, medical renal disease. Improved with gentle hydration. Stopped home lasix . Recommended f/u with nephrology. Per report, she was clinically euvolemic. Recommended to continue eliquis , carvedilol  and amiodarone .    Past Medical History:  Diagnosis Date   B12 deficiency    Cardiomyopathy (HCC)    a. 09/2022 Echo: EF 35-40%; b. 09/2022 MV: apical defect w/ mild peri-infarct ischemia. EF 43%.   Chronic HFrEF (heart failure with reduced ejection fraction) (HCC)    a. 01/2020 Echo: EF 30-35%; b. 07/2020 Echo: EF 50-55%; c. 06/2022 Echo: EF 50-55%; d. 09/2022 Echo: EF 35-40%, globh HK, mod reduced RV fxn, sev BAE, mod MR.   CKD (chronic kidney disease), stage IV (HCC)    COPD (chronic obstructive pulmonary disease) (HCC)    Depression    Endometriosis    Hypertension    Mixed hyperlipidemia    Moderate mitral regurgitation    Normocytic anemia    PAF (paroxysmal atrial fibrillation) (HCC)    a. CHA2DS2VASc = 4-->eliquis  2.5 bid.   Paroxymsal Atrial flutter (HCC)    Tobacco abuse    Past Surgical History:  Procedure Laterality Date   ABDOMINAL HYSTERECTOMY     ATRIAL FIBRILLATION ABLATION N/A 08/04/2023   Procedure: ATRIAL FIBRILLATION ABLATION;  Surgeon: Cindie Ole DASEN, MD;  Location: [facility] INVASIVE CV LAB;  Service: Cardiovascular;  Laterality: N/A;   BREAST BIOPSY Right 2015   neg-  FIBROADENOMA    CENTRAL LINE INSERTION  11/15/2022   Procedure: CENTRAL LINE INSERTION;  Surgeon: Mady Bruckner, MD;  Location: [facility] INVASIVE CV LAB;  Service: Cardiovascular;;   COLONOSCOPY WITH PROPOFOL  N/A 11/24/2022   Procedure: COLONOSCOPY WITH PROPOFOL ;  Surgeon: Maryruth Ole DASEN, MD;  Location: [facility] ENDOSCOPY;  Service: Endoscopy;  Laterality: N/A;   COLONOSCOPY WITH PROPOFOL  N/A 11/23/2022   Procedure: COLONOSCOPY WITH PROPOFOL ;  Surgeon: Maryruth Ole DASEN, MD;  Location: [facility] ENDOSCOPY;  Service: Endoscopy;  Laterality: N/A;   ESOPHAGOGASTRODUODENOSCOPY (EGD) WITH PROPOFOL  N/A 11/23/2022   Procedure: ESOPHAGOGASTRODUODENOSCOPY (EGD) WITH PROPOFOL ;  Surgeon: Maryruth Ole DASEN, MD;  Location: [facility] ENDOSCOPY;  Service: Endoscopy;  Laterality: N/A;   LEFT ATRIAL APPENDAGE OCCLUSION N/A 05/27/2024   Procedure: LEFT ATRIAL APPENDAGE OCCLUSION;  Surgeon: Cindie Ole DASEN, MD;  Location: [facility] INVASIVE CV LAB;  Service: Cardiovascular;  Laterality: N/A;   RIGHT HEART CATH N/A 11/15/2022   Procedure: RIGHT HEART CATH;  Surgeon: Mady Bruckner, MD;  Location: [facility] INVASIVE CV LAB;  Service: Cardiovascular;  Laterality: N/A;   RIGHT/LEFT HEART CATH AND CORONARY ANGIOGRAPHY N/A 11/25/2022   Procedure: RIGHT/LEFT HEART CATH AND CORONARY ANGIOGRAPHY;  Surgeon: Darron Deatrice LABOR, MD;  Location: [facility] INVASIVE CV LAB;  Service: Cardiovascular;  Laterality: N/A;   TAH/RSO  1999   secondary to bleeding and endometriosis (Dr Trula)   TRANSESOPHAGEAL ECHOCARDIOGRAM (CATH LAB) N/A 08/04/2023   Procedure: TRANSESOPHAGEAL ECHOCARDIOGRAM;  Surgeon: Cindie Ole DASEN, MD;  Location: [facility] INVASIVE CV LAB;  Service: Cardiovascular;  Laterality: N/A;   TRANSESOPHAGEAL ECHOCARDIOGRAM (CATH LAB) N/A 05/27/2024   Procedure: TRANSESOPHAGEAL ECHOCARDIOGRAM;  Surgeon: Cindie Ole DASEN, MD;  Location: [facility] INVASIVE CV LAB;  Service: Cardiovascular;  Laterality: N/A;   Family History  Problem Relation Age of Onset    Hypercholesterolemia Mother    Rheum arthritis Father    Rheum arthritis Daughter    Fibromyalgia Daughter    Breast cancer Neg Hx    Colon cancer Neg Hx    Social History   Socioeconomic History   Marital status: Married    Spouse name: Not on file   Number of children: Not on file   Years of education: Not on file   Highest education level: 12th grade  Occupational History   Not on file  Tobacco Use   Smoking status: Former    Current packs/day: 0.00    Average packs/day: 0.5 packs/day for 49.0 years (24.5 ttl pk-yrs)    Types: Cigarettes    Start date: 3    Quit date: 2024    Years since quitting: 2.0   Smokeless tobacco: Never   Tobacco comments:    Patient quit smoking recently \\Quit  6 months ago  Vaping Use   Vaping status: Never Used  Substance and Sexual Activity   Alcohol use: Not Currently    Alcohol/week: 0.0 standard drinks of alcohol    Comment: occasional   Drug use: No   Sexual activity: Not Currently  Other Topics Concern   Not on file  Social History Narrative   Lives in [place] with her husband.  She is retired from [employer]	[place].  She does not routinely exercise.   Social Drivers of Health   Tobacco Use: Medium Risk (08/21/2024)   Patient History    Smoking Tobacco Use: Former    Smokeless Tobacco Use: Never    Passive Exposure: Not on file  Financial Resource Strain: Low Risk (12/15/2023)   Overall Financial Resource Strain (CARDIA)    Difficulty of Paying Living Expenses: Not hard at all  Food Insecurity: No Food Insecurity (09/06/2024)   Epic    Worried About Programme Researcher, Broadcasting/film/video in the Last Year: Never true    Ran Out of Food in the Last Year: Never true  Transportation Needs: No Transportation Needs (09/06/2024)   Epic    Lack of Transportation (Medical): No    Lack of Transportation (Non-Medical): No  Physical Activity: Inactive (12/15/2023)   Exercise Vital Sign    Days of Exercise per Week: 0 days    Minutes of Exercise  per Session: 0 min  Stress: No Stress Concern Present (12/15/2023)   [employer] of Occupational Health - Occupational Stress Questionnaire    Feeling of Stress : Only a little  Social Connections: Moderately Integrated (09/01/2024)   Social Connection and Isolation Panel    Frequency of Communication with Friends and Family: More than three times a week    Frequency of Social Gatherings with Friends and Family: More than three times a week    Attends Religious Services: More than 4 times per year    Active Member of Clubs or Organizations: No    Attends Banker Meetings: Never    Marital Status: Married  Depression (PHQ2-9): Low Risk (09/06/2024)   Depression (PHQ2-9)    PHQ-2 Score: 0  Alcohol Screen: Low Risk (12/15/2023)   Alcohol Screen    Last Alcohol Screening Score (AUDIT): 0  Housing: Unknown (09/06/2024)   Epic  Unable to Pay for Housing in the Last Year: No    Number of Times Moved in the Last Year: Not on file    Homeless in the Last Year: No  Utilities: Not At Risk (09/06/2024)   Epic    Threatened with loss of utilities: No  Health Literacy: Adequate Health Literacy (12/16/2023)   B1300 Health Literacy    Frequency of need for help with medical instructions: Never     Review of Systems     Objective:     There were no vitals taken for this visit. Wt Readings from Last 3 Encounters:  09/01/24 110 lb 10.7 oz (50.2 kg)  08/21/24 115 lb (52.2 kg)  06/03/24 116 lb 6.4 oz (52.8 kg)    Physical Exam  {Perform Simple Foot Exam  Perform Detailed exam:1} {Insert foot Exam (Optional):30965}   Outpatient Encounter Medications as of 09/14/2024  Medication Sig   acetaminophen  (TYLENOL ) 325 MG tablet Take 650 mg by mouth every 6 (six) hours as needed for mild pain (pain score 1-3) or moderate pain (pain score 4-6) (pain.).   albuterol  (VENTOLIN  HFA) 108 (90 Base) MCG/ACT inhaler Inhale 2 puffs into the lungs every 6 (six) hours as needed for wheezing  or shortness of breath.   amiodarone  (PACERONE ) 200 MG tablet Take 200 mg by mouth daily.   amLODipine  (NORVASC ) 10 MG tablet Take 10 mg by mouth daily.   apixaban  (ELIQUIS ) 2.5 MG TABS tablet Take 1 tablet (2.5 mg total) by mouth 2 (two) times daily.   atorvastatin  (LIPITOR) 40 MG tablet TAKE ONE TABLET BY MOUTH EVERY DAY   buPROPion  (WELLBUTRIN  XL) 150 MG 24 hr tablet Take 1 tablet (150 mg total) by mouth daily.   calcitRIOL  (ROCALTROL ) 0.25 MCG capsule Take 0.25 mcg by mouth daily.   calcium  carbonate (OS-CAL - DOSED IN MG OF ELEMENTAL CALCIUM ) 1250 (500 Ca) MG tablet Take 1 tablet (500 mg of elemental calcium  total) by mouth 3 (three) times daily with meals.   carvedilol  (COREG ) 12.5 MG tablet Take 12.5 mg by mouth 2 (two) times daily with a meal.   FARXIGA  10 MG TABS tablet Take 1 tablet (10 mg total) by mouth daily.   hydrALAZINE  (APRESOLINE ) 25 MG tablet Take 1 tablet (25 mg total) by mouth 3 (three) times daily.   isosorbide  mononitrate (IMDUR ) 30 MG 24 hr tablet Take 1 tablet (30 mg total) by mouth daily.   levothyroxine  (SYNTHROID ) 75 MCG tablet TAKE ONE TABLET BY MOUTH DAILY BEFORE BREAKFAST   loratadine  (CLARITIN ) 10 MG tablet Take 10 mg by mouth daily.   omeprazole  (PRILOSEC) 20 MG capsule Take 1 capsule (20 mg total) by mouth daily.   umeclidinium-vilanterol (ANORO ELLIPTA ) 62.5-25 MCG/ACT AEPB Inhale 1 puff into the lungs daily. (Patient not taking: Reported on 09/06/2024)   venlafaxine  XR (EFFEXOR -XR) 150 MG 24 hr capsule TAKE ONE CAPSULE BY MOUTH EVERY DAY   No facility-administered encounter medications on file as of 09/14/2024.     Lab Results  Component Value Date   WBC 18.5 (H) 09/03/2024   HGB 14.5 09/03/2024   HCT 44.1 09/03/2024   PLT 230 09/03/2024   GLUCOSE 99 09/04/2024   CHOL 152 06/03/2024   TRIG 103.0 06/03/2024   HDL 62.40 06/03/2024   LDLDIRECT 53.3 10/14/2012   LDLCALC 69 06/03/2024   ALT 16 09/01/2024   AST 19 09/01/2024   NA 138 09/04/2024   K 4.8  09/04/2024   CL 110 09/04/2024   CREATININE 3.10 (H)  09/04/2024   BUN 101 (H) 09/04/2024   CO2 19 (L) 09/04/2024   TSH 1.38 02/19/2024   INR 0.9 08/21/2024   HGBA1C 4.8 01/02/2023    DG Chest 2 View Result Date: 08/30/2024 EXAM: 2 VIEW(S) XRAY OF THE CHEST 08/30/2024 04:19:00 PM COMPARISON: 08/21/2024 CLINICAL HISTORY: sob FINDINGS: LUNGS AND PLEURA: Lung hyperinflation. Chronic coarsened markings without pulmonary edema. No pleural effusion. No pneumothorax. HEART AND MEDIASTINUM: No acute abnormality of the cardiac and mediastinal silhouettes. BONES AND SOFT TISSUES: Bilateral healed rib fractures. Scoliosis. IMPRESSION: 1. No acute findings. 2. Lung hyperinflation and chronic coarsened markings without pulmonary edema. 3. Bilateral healed rib fractures and scoliosis. Electronically signed by: Greig Pique MD 08/30/2024 05:12 PM EST RP Workstation: [id number]       Assessment & Plan:  Hypercholesterolemia  Primary hypertension     Allena Hamilton, MD "

## 2024-09-18 ENCOUNTER — Encounter: Payer: Self-pay | Admitting: Internal Medicine

## 2024-09-18 NOTE — Progress Notes (Signed)
 Patient ID: Dawn Sherman, female   DOB: 24-Jul-1956, 69 y.o.   MRN: [id number] Did not show for appt. Rescheduled.

## 2024-09-18 NOTE — Assessment & Plan Note (Signed)
 hospital follow up - admitted 08/30/24 - 09/04/24 - copd exacerbation and possible bacterial pneumonia - post viral. Recently hospitalized 08/21/24 - 08/25/24 - infulenza/copd/acute respiratory failure. Most recent admission - treated with nebs, steroids, azithromycine and oxygen. Discharged to continue 2L oxygen continuous at home. CR noted to be 3.10<4.87 with baseline 2.8-3.2. urinalysis unremarkable. Renal ultrasound - no acute findings, medical renal disease. Improved with gentle hydration. Stopped home lasix . Recommended f/u with nephrology. Per report, she was clinically euvolemic. Recommended to continue eliquis , carvedilol  and amiodarone .

## 2024-09-23 ENCOUNTER — Telehealth: Payer: Self-pay | Admitting: Internal Medicine

## 2024-09-23 ENCOUNTER — Ambulatory Visit: Admitting: Internal Medicine

## 2024-09-23 ENCOUNTER — Encounter: Payer: Self-pay | Admitting: Internal Medicine

## 2024-09-23 VITALS — BP 136/86 | HR 100 | Temp 98.1°F | Ht 65.0 in | Wt 114.0 lb

## 2024-09-23 DIAGNOSIS — E875 Hyperkalemia: Secondary | ICD-10-CM

## 2024-09-23 DIAGNOSIS — I4891 Unspecified atrial fibrillation: Secondary | ICD-10-CM

## 2024-09-23 DIAGNOSIS — F320 Major depressive disorder, single episode, mild: Secondary | ICD-10-CM

## 2024-09-23 DIAGNOSIS — J439 Emphysema, unspecified: Secondary | ICD-10-CM

## 2024-09-23 DIAGNOSIS — I10 Essential (primary) hypertension: Secondary | ICD-10-CM

## 2024-09-23 DIAGNOSIS — K219 Gastro-esophageal reflux disease without esophagitis: Secondary | ICD-10-CM

## 2024-09-23 DIAGNOSIS — E7800 Pure hypercholesterolemia, unspecified: Secondary | ICD-10-CM

## 2024-09-23 DIAGNOSIS — J101 Influenza due to other identified influenza virus with other respiratory manifestations: Secondary | ICD-10-CM

## 2024-09-23 DIAGNOSIS — I420 Dilated cardiomyopathy: Secondary | ICD-10-CM

## 2024-09-23 DIAGNOSIS — I5020 Unspecified systolic (congestive) heart failure: Secondary | ICD-10-CM

## 2024-09-23 DIAGNOSIS — E039 Hypothyroidism, unspecified: Secondary | ICD-10-CM

## 2024-09-23 DIAGNOSIS — D72829 Elevated white blood cell count, unspecified: Secondary | ICD-10-CM

## 2024-09-23 DIAGNOSIS — I480 Paroxysmal atrial fibrillation: Secondary | ICD-10-CM

## 2024-09-23 DIAGNOSIS — N184 Chronic kidney disease, stage 4 (severe): Secondary | ICD-10-CM

## 2024-09-23 DIAGNOSIS — R739 Hyperglycemia, unspecified: Secondary | ICD-10-CM

## 2024-09-23 DIAGNOSIS — F1021 Alcohol dependence, in remission: Secondary | ICD-10-CM

## 2024-09-23 DIAGNOSIS — I5022 Chronic systolic (congestive) heart failure: Secondary | ICD-10-CM

## 2024-09-23 LAB — CBC WITH DIFFERENTIAL/PLATELET
Basophils Absolute: 0.1 10*3/uL (ref 0.0–0.1)
Basophils Relative: 0.9 % (ref 0.0–3.0)
Eosinophils Absolute: 0.2 10*3/uL (ref 0.0–0.7)
Eosinophils Relative: 2.6 % (ref 0.0–5.0)
HCT: 38 % (ref 36.0–46.0)
Hemoglobin: 12.3 g/dL (ref 12.0–15.0)
Lymphocytes Relative: 12.8 % (ref 12.0–46.0)
Lymphs Abs: 1 10*3/uL (ref 0.7–4.0)
MCHC: 32.5 g/dL (ref 30.0–36.0)
MCV: 93.2 fl (ref 78.0–100.0)
Monocytes Absolute: 0.9 10*3/uL (ref 0.1–1.0)
Monocytes Relative: 11.9 % (ref 3.0–12.0)
Neutro Abs: 5.7 10*3/uL (ref 1.4–7.7)
Neutrophils Relative %: 71.8 % (ref 43.0–77.0)
Platelets: 333 10*3/uL (ref 150.0–400.0)
RBC: 4.08 Mil/uL (ref 3.87–5.11)
RDW: 14.8 % (ref 11.5–15.5)
WBC: 7.9 10*3/uL (ref 4.0–10.5)

## 2024-09-23 LAB — LIPID PANEL
Cholesterol: 165 mg/dL (ref 28–200)
HDL: 72.9 mg/dL
LDL Cholesterol: 76 mg/dL (ref 10–99)
NonHDL: 91.86
Total CHOL/HDL Ratio: 2
Triglycerides: 81 mg/dL (ref 10.0–149.0)
VLDL: 16.2 mg/dL (ref 0.0–40.0)

## 2024-09-23 LAB — BASIC METABOLIC PANEL WITH GFR
BUN: 43 mg/dL — ABNORMAL HIGH (ref 6–23)
CO2: 28 meq/L (ref 19–32)
Calcium: 9.3 mg/dL (ref 8.4–10.5)
Chloride: 103 meq/L (ref 96–112)
Creatinine, Ser: 2.87 mg/dL — ABNORMAL HIGH (ref 0.40–1.20)
GFR: 16.24 mL/min — ABNORMAL LOW
Glucose, Bld: 73 mg/dL (ref 70–99)
Potassium: 5.5 meq/L — ABNORMAL HIGH (ref 3.5–5.1)
Sodium: 139 meq/L (ref 135–145)

## 2024-09-23 LAB — HEPATIC FUNCTION PANEL
ALT: 10 U/L (ref 3–35)
AST: 13 U/L (ref 5–37)
Albumin: 4 g/dL (ref 3.5–5.2)
Alkaline Phosphatase: 71 U/L (ref 39–117)
Bilirubin, Direct: 0.1 mg/dL (ref 0.1–0.3)
Total Bilirubin: 0.4 mg/dL (ref 0.2–1.2)
Total Protein: 6.7 g/dL (ref 6.0–8.3)

## 2024-09-23 LAB — HEMOGLOBIN A1C: Hgb A1c MFr Bld: 6.4 % (ref 4.6–6.5)

## 2024-09-23 LAB — TSH: TSH: 3.24 u[IU]/mL (ref 0.35–5.50)

## 2024-09-23 MED ORDER — VENLAFAXINE HCL ER 150 MG PO CP24: 150.0000 mg | ORAL_CAPSULE | Freq: Every day | ORAL | 1 refills | Status: AC

## 2024-09-23 MED ORDER — LEVOTHYROXINE SODIUM 75 MCG PO TABS: 75.0000 ug | ORAL_TABLET | Freq: Every day | ORAL | 1 refills | Status: AC

## 2024-09-23 MED ORDER — ATORVASTATIN CALCIUM 40 MG PO TABS: 40.0000 mg | ORAL_TABLET | Freq: Every day | ORAL | 1 refills | Status: AC

## 2024-09-23 MED ORDER — BUPROPION HCL ER (XL) 150 MG PO TB24: 150.0000 mg | ORAL_TABLET | Freq: Every day | ORAL | 1 refills | Status: AC

## 2024-09-23 NOTE — Telephone Encounter (Signed)
 See lab result note. Order placed for stat potassium to be drawn at [facility] tomorrow.

## 2024-09-24 ENCOUNTER — Other Ambulatory Visit
Admission: RE | Admit: 2024-09-24 | Discharge: 2024-09-24 | Disposition: A | Source: Ambulatory Visit | Attending: Internal Medicine | Admitting: Internal Medicine

## 2024-09-24 ENCOUNTER — Ambulatory Visit: Payer: Self-pay | Admitting: Internal Medicine

## 2024-09-24 DIAGNOSIS — E875 Hyperkalemia: Secondary | ICD-10-CM | POA: Diagnosis present

## 2024-09-24 LAB — POTASSIUM: Potassium: 5.2 mmol/L — ABNORMAL HIGH (ref 3.5–5.1)

## 2024-09-25 ENCOUNTER — Encounter: Payer: Self-pay | Admitting: Internal Medicine

## 2024-09-25 NOTE — Assessment & Plan Note (Signed)
 Continue prilosec

## 2024-09-25 NOTE — Assessment & Plan Note (Signed)
 Conitnue eliquis , carvedilol  and amiodarone . Stable. Follow. Continue f/u with cardiology.

## 2024-09-25 NOTE — Assessment & Plan Note (Signed)
 No recent alcohol intake. Discussed. Follow.

## 2024-09-25 NOTE — Telephone Encounter (Signed)
Order placed for stat potassium to be drawn at [facility] 

## 2024-09-25 NOTE — Assessment & Plan Note (Signed)
 Continue effexor  and wellbutrin . Overall stable. Follow

## 2024-09-25 NOTE — Assessment & Plan Note (Signed)
"   CR noted to be 3.10<4.87 with baseline 2.8-3.2. urinalysis unremarkable. Renal ultrasound - no acute findings, medical renal disease. Improved with gentle hydration. Stopped home lasix . Recommended f/u with nephrology. Has restarted lasix . Weight stable. Check metabolic panel today.  "

## 2024-09-25 NOTE — Assessment & Plan Note (Signed)
 Attempted watchman implant 05/27/24. Aborted due to poor stability with each tug test. Recommended referral to [facility] for consideration of amulet implant. Conitnues on eliquis . No evidence of volume overload. Breathing stable. She has adjusted her lasix . Weight stable. Check metabolic panel today.

## 2024-09-25 NOTE — Assessment & Plan Note (Deleted)
 Conitnue eliquis , carvedilol  and amiodarone . Stable. Follow. Continue f/u with cardiology.

## 2024-09-25 NOTE — Assessment & Plan Note (Signed)
 Left heart catheterization with normal coronaries as outlined.  No evidence of volume overload on exam.  Continues on Farxiga , imdur , torsemide  and coreg . Follow volume status. Weighs daily. She has adjusted her lasix . Weight is stable. Check metabolic panel today.

## 2024-09-25 NOTE — Assessment & Plan Note (Signed)
 Continue synthroid . Check tsh - on amiodarone .

## 2024-09-25 NOTE — Assessment & Plan Note (Signed)
 Recently hospitalized 08/21/24 - 08/25/24 - infulenza/copd/acute respiratory failure. Most recent admission - treated with nebs, steroids, azithromycin  and oxygen. Discharged to continue 2L oxygen continuous at home. Breathing has improved. Using oxygen at night.

## 2024-09-25 NOTE — Assessment & Plan Note (Signed)
 Continues on coreg , imdur  and demadex . Also on farxiga . Blood pressure as outlined. No changes today. Follow pressures. Check metabolic panel.

## 2024-09-25 NOTE — Assessment & Plan Note (Signed)
 Continue Lipitor.  Low-cholesterol diet.  Follow lipid panel liver function testing. No change in medication today.  Lab Results  Component Value Date   CHOL 165 09/23/2024   HDL 72.90 09/23/2024   LDLCALC 76 09/23/2024   LDLDIRECT 53.3 10/14/2012   TRIG 81.0 09/23/2024   CHOLHDL 2 09/23/2024

## 2024-09-25 NOTE — Assessment & Plan Note (Signed)
 Wbc eevated in hospital. Recheck cbc today.

## 2024-09-25 NOTE — Assessment & Plan Note (Signed)
 Low carb diet and exercise.  Follow met b and a1c.

## 2024-09-25 NOTE — Assessment & Plan Note (Signed)
 Breathing overall stable.  ?

## 2024-09-30 ENCOUNTER — Other Ambulatory Visit: Payer: Self-pay | Admitting: Cardiovascular Disease

## 2024-10-26 ENCOUNTER — Ambulatory Visit: Admitting: Internal Medicine

## 2024-11-08 ENCOUNTER — Other Ambulatory Visit

## 2024-12-20 ENCOUNTER — Ambulatory Visit

## 2024-12-22 ENCOUNTER — Ambulatory Visit: Admitting: Internal Medicine

## 2025-05-10 ENCOUNTER — Ambulatory Visit: Admitting: Oncology

## 2025-05-10 ENCOUNTER — Other Ambulatory Visit
